# Patient Record
Sex: Male | Born: 1980 | Race: White | Hispanic: No | Marital: Single | Smoking: Never smoker
Health system: Southern US, Community
[De-identification: ages and names within clinical notes are randomized; demographics above are authoritative.]

## PROBLEM LIST (undated history)

## (undated) DIAGNOSIS — I10 Essential (primary) hypertension: Secondary | ICD-10-CM

## (undated) DIAGNOSIS — R569 Unspecified convulsions: Secondary | ICD-10-CM

## (undated) DIAGNOSIS — F32A Depression, unspecified: Secondary | ICD-10-CM

## (undated) DIAGNOSIS — F209 Schizophrenia, unspecified: Secondary | ICD-10-CM

## (undated) DIAGNOSIS — E119 Type 2 diabetes mellitus without complications: Secondary | ICD-10-CM

## (undated) DIAGNOSIS — E042 Nontoxic multinodular goiter: Secondary | ICD-10-CM

## (undated) DIAGNOSIS — K766 Portal hypertension: Secondary | ICD-10-CM

## (undated) DIAGNOSIS — E785 Hyperlipidemia, unspecified: Secondary | ICD-10-CM

## (undated) DIAGNOSIS — I4891 Unspecified atrial fibrillation: Secondary | ICD-10-CM

## (undated) DIAGNOSIS — I8500 Esophageal varices without bleeding: Secondary | ICD-10-CM

## (undated) DIAGNOSIS — D649 Anemia, unspecified: Secondary | ICD-10-CM

## (undated) DIAGNOSIS — F1020 Alcohol dependence, uncomplicated: Secondary | ICD-10-CM

## (undated) DIAGNOSIS — J449 Chronic obstructive pulmonary disease, unspecified: Secondary | ICD-10-CM

## (undated) DIAGNOSIS — K3189 Other diseases of stomach and duodenum: Secondary | ICD-10-CM

## (undated) DIAGNOSIS — K7031 Alcoholic cirrhosis of liver with ascites: Secondary | ICD-10-CM

## (undated) DIAGNOSIS — F329 Major depressive disorder, single episode, unspecified: Secondary | ICD-10-CM

## (undated) HISTORY — DX: Major depressive disorder, single episode, unspecified: F32.9

## (undated) HISTORY — PX: NO PAST SURGERIES: SHX2092

## (undated) HISTORY — DX: Hyperlipidemia, unspecified: E78.5

## (undated) HISTORY — DX: Depression, unspecified: F32.A

## (undated) HISTORY — DX: Essential (primary) hypertension: I10

## (undated) HISTORY — DX: Type 2 diabetes mellitus without complications: E11.9

## (undated) HISTORY — DX: Alcohol dependence, uncomplicated: F10.20

## (undated) HISTORY — DX: Schizophrenia, unspecified: F20.9

---

## 2004-07-30 ENCOUNTER — Emergency Department: Payer: Self-pay | Admitting: Emergency Medicine

## 2005-05-18 ENCOUNTER — Emergency Department: Payer: Self-pay | Admitting: Emergency Medicine

## 2005-09-25 ENCOUNTER — Ambulatory Visit: Payer: Self-pay | Admitting: Specialist

## 2009-03-17 ENCOUNTER — Emergency Department: Payer: Self-pay | Admitting: Emergency Medicine

## 2009-12-04 ENCOUNTER — Emergency Department: Payer: Self-pay | Admitting: Unknown Physician Specialty

## 2010-09-16 ENCOUNTER — Emergency Department: Payer: Self-pay | Admitting: Emergency Medicine

## 2011-06-18 ENCOUNTER — Emergency Department: Payer: Self-pay | Admitting: Emergency Medicine

## 2012-04-16 ENCOUNTER — Emergency Department: Payer: Self-pay | Admitting: Emergency Medicine

## 2012-04-16 LAB — CK TOTAL AND CKMB (NOT AT ARMC)
CK, Total: 142 U/L (ref 35–232)
CK-MB: <0.5 ng/mL — ABNORMAL LOW (ref 0.5–3.6)

## 2012-04-16 LAB — BASIC METABOLIC PANEL
Anion Gap: 7 (ref 7–16)
Calcium, Total: 8.6 mg/dL (ref 8.5–10.1)
Chloride: 106 mmol/L (ref 98–107)
Co2: 27 mmol/L (ref 21–32)
Creatinine: 1.21 mg/dL (ref 0.60–1.30)
Glucose: 110 mg/dL — ABNORMAL HIGH (ref 65–99)
Osmolality: 280 (ref 275–301)

## 2012-04-16 LAB — CBC
MCH: 30.6 pg (ref 26.0–34.0)
MCHC: 33.8 g/dL (ref 32.0–36.0)
MCV: 90 fL (ref 80–100)
Platelet: 173 10*3/uL (ref 150–440)
RDW: 14.2 % (ref 11.5–14.5)
WBC: 7.7 10*3/uL (ref 3.8–10.6)

## 2012-10-15 ENCOUNTER — Emergency Department: Payer: Self-pay | Admitting: Emergency Medicine

## 2013-11-28 ENCOUNTER — Emergency Department: Payer: Self-pay | Admitting: Emergency Medicine

## 2013-12-13 ENCOUNTER — Emergency Department: Payer: Self-pay | Admitting: Emergency Medicine

## 2014-05-11 ENCOUNTER — Emergency Department: Payer: Self-pay | Admitting: Emergency Medicine

## 2014-07-23 ENCOUNTER — Emergency Department: Payer: Self-pay | Admitting: Emergency Medicine

## 2014-07-23 LAB — COMPREHENSIVE METABOLIC PANEL
ALBUMIN: 3.4 g/dL (ref 3.4–5.0)
ALT: 76 U/L — AB
Alkaline Phosphatase: 144 U/L — ABNORMAL HIGH
Anion Gap: 9 (ref 7–16)
BILIRUBIN TOTAL: 0.9 mg/dL (ref 0.2–1.0)
BUN: 10 mg/dL (ref 7–18)
Calcium, Total: 8.6 mg/dL (ref 8.5–10.1)
Chloride: 104 mmol/L (ref 98–107)
Co2: 30 mmol/L (ref 21–32)
Creatinine: 0.68 mg/dL (ref 0.60–1.30)
Glucose: 79 mg/dL (ref 65–99)
OSMOLALITY: 283 (ref 275–301)
Potassium: 3.5 mmol/L (ref 3.5–5.1)
SGOT(AST): 94 U/L — ABNORMAL HIGH (ref 15–37)
SODIUM: 143 mmol/L (ref 136–145)
Total Protein: 8.1 g/dL (ref 6.4–8.2)

## 2014-07-23 LAB — CBC
HCT: 43.7 % (ref 40.0–52.0)
HGB: 14.5 g/dL (ref 13.0–18.0)
MCH: 31.7 pg (ref 26.0–34.0)
MCHC: 33.1 g/dL (ref 32.0–36.0)
MCV: 96 fL (ref 80–100)
PLATELETS: 138 10*3/uL — AB (ref 150–440)
RBC: 4.56 10*6/uL (ref 4.40–5.90)
RDW: 16.1 % — AB (ref 11.5–14.5)
WBC: 8.5 10*3/uL (ref 3.8–10.6)

## 2014-07-23 LAB — TROPONIN I

## 2014-07-23 LAB — LIPASE, BLOOD: Lipase: 119 U/L (ref 73–393)

## 2014-07-23 LAB — MAGNESIUM: Magnesium: 1.6 mg/dL — ABNORMAL LOW

## 2014-11-26 ENCOUNTER — Emergency Department: Payer: Self-pay | Admitting: Emergency Medicine

## 2014-12-25 ENCOUNTER — Inpatient Hospital Stay
Admit: 2014-12-25 | Disposition: A | Discharge status: Home / Self Care | Payer: Self-pay | Attending: Psychiatry | Admitting: Psychiatry

## 2014-12-25 LAB — DRUG SCREEN, URINE
Amphetamines, Ur Screen: NEGATIVE
Barbiturates, Ur Screen: NEGATIVE
Benzodiazepine, Ur Scrn: NEGATIVE
COCAINE METABOLITE, UR ~~LOC~~: NEGATIVE
Cannabinoid 50 Ng, Ur ~~LOC~~: NEGATIVE
MDMA (Ecstasy)Ur Screen: NEGATIVE
METHADONE, UR SCREEN: NEGATIVE
Opiate, Ur Screen: NEGATIVE
Phencyclidine (PCP) Ur S: NEGATIVE
TRICYCLIC, UR SCREEN: NEGATIVE

## 2014-12-25 LAB — CBC
HCT: 44.7 % (ref 40.0–52.0)
HGB: 14.4 g/dL (ref 13.0–18.0)
MCH: 30.5 pg (ref 26.0–34.0)
MCHC: 32.2 g/dL (ref 32.0–36.0)
MCV: 95 fL (ref 80–100)
Platelet: 155 10*3/uL (ref 150–440)
RBC: 4.72 10*6/uL (ref 4.40–5.90)
RDW: 15.3 % — AB (ref 11.5–14.5)
WBC: 10.1 10*3/uL (ref 3.8–10.6)

## 2014-12-25 LAB — URINALYSIS, COMPLETE
BACTERIA: NONE SEEN
BILIRUBIN, UR: NEGATIVE
GLUCOSE, UR: NEGATIVE mg/dL (ref 0–75)
Hyaline Cast: 5
Ketone: NEGATIVE
LEUKOCYTE ESTERASE: NEGATIVE
Nitrite: NEGATIVE
Ph: 5 (ref 4.5–8.0)
Protein: 30
RBC,UR: 9 /HPF (ref 0–5)
SPECIFIC GRAVITY: 1.016 (ref 1.003–1.030)
Squamous Epithelial: <1

## 2014-12-25 LAB — COMPREHENSIVE METABOLIC PANEL
ALT: 107 U/L — AB
Albumin: 4.1 g/dL
Alkaline Phosphatase: 148 U/L — ABNORMAL HIGH
Anion Gap: 11 (ref 7–16)
BUN: 15 mg/dL
Bilirubin,Total: 0.9 mg/dL
CREATININE: 1.23 mg/dL
Calcium, Total: 8.8 mg/dL — ABNORMAL LOW
Chloride: 100 mmol/L — ABNORMAL LOW
Co2: 30 mmol/L
EGFR (African American): >60
EGFR (Non-African Amer.): >60
GLUCOSE: 104 mg/dL — AB
POTASSIUM: 3.4 mmol/L — AB
SGOT(AST): 146 U/L — ABNORMAL HIGH
SODIUM: 141 mmol/L
TOTAL PROTEIN: 8.5 g/dL — AB

## 2014-12-25 LAB — ACETAMINOPHEN LEVEL: Acetaminophen: <10 ug/mL

## 2014-12-25 LAB — SALICYLATE LEVEL

## 2014-12-25 LAB — ETHANOL: Ethanol: 173 mg/dL

## 2014-12-28 LAB — LIPID PANEL
CHOLESTEROL: 234 mg/dL — AB
HDL Cholesterol: 31 mg/dL — ABNORMAL LOW
LDL CHOLESTEROL, CALC: 185 mg/dL — AB
Triglycerides: 88 mg/dL
VLDL Cholesterol, Calc: 18 mg/dL

## 2014-12-28 LAB — TSH: Thyroid Stimulating Horm: 1.585 u[IU]/mL

## 2014-12-29 LAB — HEMOGLOBIN A1C: HEMOGLOBIN A1C: 5.7 %

## 2015-01-21 NOTE — Consult Note (Signed)
PATIENT NAME:  Howard [name], Howard [name] MR#:  [id number] DATE OF BIRTH:  03/21/1981  DATE OF CONSULTATION:  12/25/2014  REFERRING PHYSICIAN:   CONSULTING PHYSICIAN:  Audery Amel, MD  IDENTIFYING INFORMATION AND REASON FOR CONSULTATION: A 34 year old man with a history of schizophrenia and alcohol abuse who came to my office today with a chief complaint "I can't stand it, the ghosts are going to get me."   HISTORY OF PRESENT ILLNESS: Information from the patient and the chart and my history of working with this patient for the last few years. Howard [name] came to my office for an unscheduled visit today in great distress. He tells me that for the past 2 weeks he has felt frightened and been unable to sleep. He has not gotten any sleep for the last several days. He believes that there is a ghost in his house that is trying to get him. He tells me that he is hearing doors banging and has seen things floating in the hallway. He feels like his mood is very frightened. He claims that he has still been taking all of his medicines regularly, but admits that he has gone back to drinking heavily. He says he has probably been drinking at least a fifth of vodka a day for the last week, maybe more. This was a relapse after having worked his way down to drinking only about 1 time a week. He is not abusing any other drugs. The patient has some chronic social stress, but nothing really acute. He is still upset about his father dying 2-1/2 years ago, but there have not been any more recent deaths or upheavals in his family.   PAST PSYCHIATRIC HISTORY: Diagnosed with schizophrenia at around the age of 81. Never had a hospitalization area. Has been on multiple antipsychotics including Geodon which caused some high blood pressure, Risperdal, Zyprexa which he was stable for several years, and more recently Jordan which we switched to late last year in an attempt to help him lose weight. Medications have typically kept his symptoms  under pretty good control, although he has had episodes of breakthrough hallucinations. Auditory hallucinations and paranoia have been his main problems. No history of violence. He says that he did have a suicide attempt prior to his first diagnosis when he was hearing voices very loudly, but his father prevented him from shooting himself and has never repeated that.   PAST MEDICAL HISTORY: Overweight gentleman, has high blood pressure and asthma. No other known medical diagnoses.   SOCIAL HISTORY:  Unmarried. Lives with his brother. His mother is also living and lives in a retirement home. He is pretty close with his mother. When he is well Howard [name] has some very healthy interests such as working on automobiles and rebuilding them and music.   SUBSTANCE ABUSE HISTORY: The patient only revealed to me a few months ago that he had developed a drinking problem. Evidently it had been going on for quite a while that he had been drinking vodka on a daily basis. He had kept it out of me out of embarrassment for a long time. After he told me about it we have been working on trying to get him to decrease his alcohol use. As of earlier in March he had gotten himself down to drinking only 1 day and night versus the 7 that he was doing before. These last couple of weeks he has had a relapse. No known history of DTs or seizures. No history of  other more formal substance abuse treatment.   FAMILY HISTORY: Both his father and grandfather had schizophrenia.   REVIEW OF SYSTEMS: He is feeling upset and tearful and frightened. Physically no pain. No GI upset. No pulmonary complaints. Really no physical complaints at all.   MENTAL STATUS EXAMINATION: Adequately groomed, looks older than his stated age. Cooperative with the interview. Good eye contact, normal psychomotor activity. Speech is quiet and decreased in total amount, which is pretty normal for him. Affect has been tearful and upset, although still reactive. Mood  is nervous. Thoughts are a little bit slow and concrete. He is paranoid and believes there is a ghost haunting his house. He is having auditory and possibly visual hallucinations. He is alert and oriented x 4. He can repeat 3 words immediately, remembers 2 out of 3 at 3 minutes. Judgment and insight fairly intact. Able to make reasonable decisions about his treatment.   LABORATORY RESULTS: Urine and drug screen not yet back, but the alcohol level was 173. Elevated total protein at 8.5, elevated AST at 146, elevated ALT at 107, elevated alkaline phosphatase at 148. Low calcium 8.3, low potassium 3.4, glucose 104. Everything else normal. CBC is all normal. Salicylates and acetaminophen negative.   VITAL SIGNS: Blood pressure 152/90, respirations 18, pulse 95, temperature 98.4.   ASSESSMENT: This is a 34 year old man with schizophrenia and alcohol abuse. He came to my office today very upset, paranoid, worsening hallucinations, tearful, feeling like he could not stand going on anymore. Also he has obviously relapsed into drinking. He has asked me for admission, and I think that is reasonable under the current circumstances, particularly given his lack of outpatient support.   TREATMENT PLAN: Admit to psychiatry. Continue his current medications, which are Latuda 80 mg a day, Celexa 20 mg a day, albuterol inhaler p.r.n., metoprolol daily, and an aspirin once a day. I am going to increase his Latuda dose to 120 mg. Close and suicide precautions in place.   DIAGNOSIS PRINCIPAL AND PRIMARY:   AXIS I: Schizophrenia.   SECONDARY DIAGNOSES:   AXIS I: Alcohol abuse, moderate.   AXIS II: Deferred.   AXIS III:  1.  High blood pressure.  2.  Obesity.  3.  Asthma.     ____________________________ Audery AmelJohn T. Aundreya Souffrant, MD jtc:bu D: 12/25/2014 15:37:45 ET T: 12/25/2014 15:56:55 ET JOB#: [id number]  cc: Audery AmelJohn T. Alanys Godino, MD, <Dictator> Audery AmelJOHN T Pau Banh MD ELECTRONICALLY SIGNED 12/26/2014 22:36

## 2015-01-21 NOTE — H&P (Signed)
PATIENT NAME:  Howard [name], [name] MR#:  [id number] DATE OF BIRTH:  1981-03-26  DATE OF ADMISSION:  12/25/2014  IDENTIFYING INFORMATION: The patient is 34 year old, single, Caucasian male [place], who lives with his brother and receives disability for a diagnosis of schizophrenia. This patient also has a history of alcohol dependence.   CHIEF COMPLAINT: "There is a ghost in my house."   HISTORY OF PRESENT ILLNESS:  This patient presented to our Emergency Department on April 4.  His alcohol level at arrival was 173.  The patient reported that he had been having auditory and visual hallucinations for the last 3 days and that he had had no asleep during the same period of time. He stated that he was afraid of going to sleep because there was a ghost in the house.  The patient also reported that he was drinking about a 5th of vodka on a daily basis for the last 2 weeks. The patient stated that he had not been taking his medications as prescribed.  The main reason for coming into the Emergency Room was because "the ghost was bothering a lot."  Today, he denies auditory or visual hallucinations. Denies suicidal or homicidal ideations. The patient was somnolent and was falling asleep during the assessment.  Other information was obtained from the patient's chart. Since his admission, CIWA has been checked and several of them have been about 10.  His vital signs also show evidence of alcohol withdrawal as he has been tachycardic and his systolic and diastolic blood pressure have been significantly elevated.   PAST PSYCHIATRIC HISTORY: The patient states that he follows with Dr. Toni Amendlapacs from [facility]. He has been diagnosed with schizophrenia and alcohol dependence. He has had multiple trials with antipsychotics in the past such as Geodon, risperidone, Zyprexa and most recently JordanLatuda. There is no history of suicidal attempts, but per the note from Dr. Toni Amendlapacs the patient's father  prevented him from shooting himself years ago.  MEDICATIONS PRESCRIBED PRIOR TO ADMISSION:  Latuda 120 mg at bedtime and citalopram 20 mg daily.   PAST MEDICAL HISTORY: Positive for hypertension for which he takes Toprol XL 50 mg p.o. daily.   FAMILY HISTORY: The patient reports that his dad, uncle, and great-grandfather also suffered from schizophrenia.  He denies any history of suicides in his family.   SOCIAL HISTORY: The patient receives disability for schizophrenia since 2003. He lives with [place] brother in [name], [name]. He is single, never married, does not have any children. No known legal history.   ALLERGIES: No known drug allergies.   REVIEW OF SYSTEMS: The patient complains of nausea and tremors.  Denies vomiting, diarrhea, or seizures. The rest of the 10 review is negative.   MENTAL STATUS EXAMINATION: The patient is a 34 year old Caucasian male, obese, who appears his stated age. He is lying in bed covered with a blanket and was falling asleep during the assessment. Psychomotor activity is decreased. He did appear to have some tremors. Eye contact was poor as he was falling asleep. Speech had decreased production and was not a spontaneous regular tone and rate. Thought content: Negative for suicidality, homicidality, and negative for auditory or visual hallucinations.  Positive for delusions about ghosts living in his house and bothering him. Mood dysphoric, affect restrictive, insight and judgment limited. Cognitive examination:  He is alert and oriented in person, place, time, and situation.   PHYSICAL EXAMINATION:  VITAL SIGNS: Blood pressure 186/118, pulse 83, temperature 98.2, respirations  20.  GENERAL: A 34 year old, obese, white male. His weight is 327 pounds. BMI is 40.9.  Height is 75 inches.  Physical examination was performed in our Emergency Department and was within the normal limits.  MUSCULOSKELETAL: He has normal gait, normal muscular tone. No evidence of  involuntary movements other than tremors.   LABORATORY RESULTS: BUN 15, creatinine 1.23, sodium 141, potassium 3.4, calcium 8.8. Alcohol 173. AST 146, ALT 107. The urine toxicology was negative. WBC is 10.1, hemoglobin is 14.4, hematocrit is 44.5, platelet count is 155,000.  UA was negative. Acetaminophen and salicylate level were below detection limits.   DIAGNOSES:  AXIS I: Schizophrenia, alcohol use disorder, severe. Alcohol withdrawal, hypertension, obesity.    PLAN:  For schizophrenia, the patient will be continued on Latuda 120 mg in the evening with supper. For depression, he will be continued on citalopram 20 mg daily. For hypertension, he will be continued on metoprolol Toprol XL 50 mg daily.  For tobacco use disorder, he will receive Nicotrol inhaler. For cardiovascular prevention, he will receive aspirin 81 mg daily. For alcohol withdrawal, he will receive Librium 50 mg every 8 hours, lorazepam 2 mg every 8 hours as needed for CIWA greater than 15.  For nausea, he will receive promethazine 25 mg every 8 hours.   PRECAUTIONS: He will be continued on fall precautions.  CIWA will be checked every 8 hours and vital signs will be checked every 8 hours.    ____________________________ Howard Footman, MD ahg:sp D: 12/26/2014 14:25:52 ET T: 12/26/2014 14:44:40 ET JOB#: [id number]  cc: Howard Footman, MD, <Dictator> Howard Chin MD ELECTRONICALLY SIGNED 01/01/2015 15:11

## 2015-03-20 ENCOUNTER — Ambulatory Visit (INDEPENDENT_AMBULATORY_CARE_PROVIDER_SITE_OTHER): Payer: Medicare Other | Admitting: Psychiatry

## 2015-03-20 ENCOUNTER — Encounter: Payer: Self-pay | Admitting: Psychiatry

## 2015-03-20 VITALS — BP 132/88 | Temp 98.5°F | Resp 74 | Ht 75.0 in | Wt 325.2 lb

## 2015-03-20 DIAGNOSIS — F1010 Alcohol abuse, uncomplicated: Secondary | ICD-10-CM | POA: Diagnosis not present

## 2015-03-20 DIAGNOSIS — F200 Paranoid schizophrenia: Secondary | ICD-10-CM

## 2015-03-20 MED ORDER — DIPHENHYDRAMINE HCL (SLEEP) 25 MG PO CAPS: 50.0000 mg | ORAL_CAPSULE | Freq: Every day | ORAL | Status: DC

## 2015-03-21 NOTE — Progress Notes (Signed)
BH MD/PA/NP OP Progress Note  03/21/2015 9:30 AM Howard [name]  MRN:  [id number]  Subjective:  Follow-up for this 34 year old gentleman with a history of paranoid schizophrenia and more recently alcohol abuse. Also significant social withdrawal area he tells me that recently he has had some stresses. A neighbor of his was pressuring him to go to church. At first he felt like this was a friendly and positive gesture but the neighbor became more pushy and demanded that Howard [name] go to Bible study as well which was too much for him. He had to eventually quit going. He feels sad and anxious about this. No suicidal or homicidal ideation. Basically still functioning though. ADLs are okay. He still has auditory hallucinations at times but has insight into their unreality and is able to ignore them more block them out. He has some trouble falling asleep at night and gets anxious in the evening still. He is still drinking on Saturday and Sunday but has decreased it to about a pint of liquor which is half of his previous daily consumption and he is no longer drinking during the week. Chief Complaint:  "a lot of stress" Visit Diagnosis:  No diagnosis found. paranoid schizophrenia call abuse  Past Medical History:  Past Medical History  Diagnosis Date  . Hypertension   . Schizophrenia   . Hyperlipemia    History reviewed. No pertinent past surgical history. Family History:  Family History  Problem Relation Age of Onset  . Heart disease Mother   . Hypertension Mother   . Hyperlipidemia Mother   . Stroke Father   . Heart attack Father   . Hypertension Father   . Heart disease Brother    Social History:  History   Social History  . Marital Status: Single    Spouse Name: N/A  . Number of Children: N/A  . Years of Education: N/A   Social History Main Topics  . Smoking status: Never Smoker   . Smokeless tobacco: Never Used  . Alcohol Use: 3.0 oz/week    0 Standard drinks or equivalent, 5 Shots of  liquor per week  . Drug Use: No  . Sexual Activity: No   Other Topics Concern  . None   Social History Narrative  . None   Additional History: Patient is going to the IOP program at [facility]. He finds it helpful. Continues to work on cutting down his drinking. Tolerating medicine without problem side effects. Weight is no worse than it was before. Patient is not showing active problematic delusions and he is open to advice and suggestions. No suicidal or homicidal ideation or signs of recent dangerousness.  Assessment: Patient is stable in terms of his schizophrenia. We could increase medicine but I'm not sure that the potential benefit is worth the risk of side effects. Lots of encouragement about his continued participation in substance abuse programming  Musculoskeletal: Strength & Muscle Tone: within normal limits Gait & Station: normal Patient leans: N/A  Psychiatric Specialty Exam: HPI  ROS  Blood pressure 132/88, temperature 98.5 F (36.9 C), temperature source Tympanic, resp. rate 74, height  (1.905 m), weight 325 lb 3.2 oz (147.51 kg), SpO2 92 %.Body mass index is 40.65 kg/(m^2).  General Appearance: Casual  Eye Contact:  Good  Speech:  Clear and Coherent  Volume:  Normal  Mood:  Anxious  Affect:  Congruent  Thought Process:  Linear  Orientation:  Full (Time, Place, and Person)  Thought Content:  Hallucinations: Auditory  Suicidal  Thoughts:  No  Homicidal Thoughts:  No  Memory:  Immediate;   Good Recent;   Good Remote;   Good  Judgement:  Intact  Insight:  Present  Psychomotor Activity:  Normal  Concentration:  Good  Recall:  Good  Fund of Knowledge: Fair  Language: Fair  Akathisia:  No  Handed:  Right  AIMS (if indicated):  0  Assets:  Communication Skills Desire for Improvement Financial Resources/Insurance Housing Resilience Social Support Talents/Skills  ADL's:  Intact  Cognition: WNL  Sleep:  Still has a little bit of trouble with sleep despite  using 25 mg of Benadryl. Has some trouble falling asleep at night because of worry.    Is the patient at risk to self?  No. Has the patient been a risk to self in the past 6 months?  Yes.   Has the patient been a risk to self within the distant past?  Yes.   Is the patient a risk to others?  No. Has the patient been a risk to others in the past 6 months?  No. Has the patient been a risk to others within the distant past?  No.  Current Medications: Current Outpatient Prescriptions  Medication Sig Dispense Refill  . citalopram (CELEXA) 20 MG tablet TAKE 1 TABLET BY MOUTH ONCE A DAY FOR DEPRESSION AND ANXIETY  0  . [facility] ALLERGY 25 MG tablet Take 50 mg by mouth at bedtime.  3  . DiphenhydrAMINE HCl, Sleep, 25 MG CAPS Take 50 mg by mouth at bedtime. 60 capsule 2  . losartan-hydrochlorothiazide (HYZAAR) 100-25 MG per tablet Take 1 tablet by mouth daily.  5  . metoprolol succinate (TOPROL-XL) 100 MG 24 hr tablet Take 100 mg by mouth daily.  5  . OLANZapine (ZYPREXA) 15 MG tablet      No current facility-administered medications for this visit.    Medical Decision Making:  Established Problem, Stable/Improving (1), Review of Psycho-Social Stressors (1), Review or order clinical lab tests (1), Review of Last Therapy Session (1), Review of Medication Regimen & Side Effects (2) and Review of New Medication or Change in Dosage (2)  Treatment Plan Summary:Medication management and Plan Thoughts of supportive therapy around his continued participation in substance abuse treatment. Encourage him also not to give up on going to church which is an activity he enjoys. Suggest that he can find alternative ways of doing this without feeling pressured. He expresses an understanding of this and seems to have motivation to change. No change to his anti-psychotic or citalopram. I would suggest that we try increasing the Benadryl to 50 mg at night as needed for sleep. He agrees to give it a try. I will see him back in  2-3 months. No other change plan right now.   Armida Vickroy 03/21/2015, 9:30 AM

## 2015-04-10 ENCOUNTER — Other Ambulatory Visit: Payer: Self-pay

## 2015-04-10 MED ORDER — CITALOPRAM HYDROBROMIDE 20 MG PO TABS
20.0000 mg | ORAL_TABLET | Freq: Every day | ORAL | Status: DC
Start: 2015-04-10 — End: 2015-05-17

## 2015-04-10 NOTE — Telephone Encounter (Signed)
received a fax requesting refill on citalopram hbr 20 mg take 1 tablet by mouth once a day for depression and anxiety. ( pt last seen on 03-20-15 next appt is for  05-17-15)

## 2015-05-08 ENCOUNTER — Other Ambulatory Visit: Payer: Self-pay | Admitting: Psychiatry

## 2015-05-11 ENCOUNTER — Other Ambulatory Visit: Payer: Self-pay

## 2015-05-11 NOTE — Telephone Encounter (Signed)
pt left message that he needs refill on zyprexa. pt has appt on 05-17-15  

## 2015-05-11 NOTE — Telephone Encounter (Signed)
pt left message that he needs refill on zyprexa. pt has appt on 05-17-15

## 2015-05-14 MED ORDER — OLANZAPINE 15 MG PO TABS: 15.0000 mg | ORAL_TABLET | Freq: Every day | ORAL | Status: DC

## 2015-05-17 ENCOUNTER — Ambulatory Visit (INDEPENDENT_AMBULATORY_CARE_PROVIDER_SITE_OTHER): Payer: Medicare Other | Admitting: Psychiatry

## 2015-05-17 DIAGNOSIS — F200 Paranoid schizophrenia: Secondary | ICD-10-CM | POA: Diagnosis not present

## 2015-05-17 MED ORDER — OLANZAPINE 15 MG PO TABS: 15.0000 mg | ORAL_TABLET | Freq: Every day | ORAL | Status: DC

## 2015-05-17 MED ORDER — CITALOPRAM HYDROBROMIDE 20 MG PO TABS
20.0000 mg | ORAL_TABLET | Freq: Every day | ORAL | Status: DC
Start: 2015-05-17 — End: 2015-07-12

## 2015-05-29 ENCOUNTER — Emergency Department
Admission: EM | Admit: 2015-05-29 | Discharge: 2015-05-30 | Disposition: A | Discharge status: Other Acute Inpatient Hospital | Payer: Medicare Other | Attending: Emergency Medicine | Admitting: Emergency Medicine

## 2015-05-29 ENCOUNTER — Encounter: Payer: Self-pay | Admitting: Emergency Medicine

## 2015-05-29 DIAGNOSIS — F200 Paranoid schizophrenia: Secondary | ICD-10-CM | POA: Insufficient documentation

## 2015-05-29 DIAGNOSIS — F1310 Sedative, hypnotic or anxiolytic abuse, uncomplicated: Secondary | ICD-10-CM | POA: Diagnosis not present

## 2015-05-29 DIAGNOSIS — F1010 Alcohol abuse, uncomplicated: Secondary | ICD-10-CM | POA: Diagnosis present

## 2015-05-29 DIAGNOSIS — Z79899 Other long term (current) drug therapy: Secondary | ICD-10-CM | POA: Diagnosis not present

## 2015-05-29 DIAGNOSIS — F10239 Alcohol dependence with withdrawal, unspecified: Secondary | ICD-10-CM | POA: Insufficient documentation

## 2015-05-29 DIAGNOSIS — F1020 Alcohol dependence, uncomplicated: Secondary | ICD-10-CM

## 2015-05-29 DIAGNOSIS — F10939 Alcohol use, unspecified with withdrawal, unspecified: Secondary | ICD-10-CM

## 2015-05-29 DIAGNOSIS — I10 Essential (primary) hypertension: Secondary | ICD-10-CM | POA: Insufficient documentation

## 2015-05-29 LAB — URINALYSIS COMPLETE WITH MICROSCOPIC (ARMC ONLY)
BILIRUBIN URINE: NEGATIVE
Glucose, UA: NEGATIVE mg/dL
KETONES UR: NEGATIVE mg/dL
Leukocytes, UA: NEGATIVE
Nitrite: NEGATIVE
PH: 9 — AB (ref 5.0–8.0)
PROTEIN: 30 mg/dL — AB
Specific Gravity, Urine: 1.014 (ref 1.005–1.030)

## 2015-05-29 LAB — COMPREHENSIVE METABOLIC PANEL
ALK PHOS: 114 U/L (ref 38–126)
ALT: 54 U/L (ref 17–63)
AST: 153 U/L — AB (ref 15–41)
Albumin: 3.3 g/dL — ABNORMAL LOW (ref 3.5–5.0)
Anion gap: 8 (ref 5–15)
BUN: 6 mg/dL (ref 6–20)
CALCIUM: 8 mg/dL — AB (ref 8.9–10.3)
CO2: 31 mmol/L (ref 22–32)
Chloride: 102 mmol/L (ref 101–111)
Creatinine, Ser: 0.71 mg/dL (ref 0.61–1.24)
GFR calc non Af Amer: >60 mL/min (ref >60)
Glucose, Bld: 161 mg/dL — ABNORMAL HIGH (ref 65–99)
Potassium: 3.3 mmol/L — ABNORMAL LOW (ref 3.5–5.1)
Sodium: 141 mmol/L (ref 135–145)
Total Bilirubin: 1.5 mg/dL — ABNORMAL HIGH (ref 0.3–1.2)
Total Protein: 7.8 g/dL (ref 6.5–8.1)

## 2015-05-29 LAB — URINE DRUG SCREEN, QUALITATIVE (ARMC ONLY)
AMPHETAMINES, UR SCREEN: NOT DETECTED
BENZODIAZEPINE, UR SCRN: POSITIVE — AB
Barbiturates, Ur Screen: NOT DETECTED
Cannabinoid 50 Ng, Ur ~~LOC~~: NOT DETECTED
Cocaine Metabolite,Ur ~~LOC~~: NOT DETECTED
MDMA (Ecstasy)Ur Screen: NOT DETECTED
METHADONE SCREEN, URINE: NOT DETECTED
Opiate, Ur Screen: NOT DETECTED
Phencyclidine (PCP) Ur S: NOT DETECTED
Tricyclic, Ur Screen: NOT DETECTED

## 2015-05-29 LAB — CBC WITH DIFFERENTIAL/PLATELET
Basophils Absolute: 0.1 10*3/uL (ref 0–0.1)
Basophils Relative: 1 %
Eosinophils Absolute: 0.2 10*3/uL (ref 0–0.7)
Eosinophils Relative: 2 %
HEMATOCRIT: 41.6 % (ref 40.0–52.0)
HEMOGLOBIN: 13.4 g/dL (ref 13.0–18.0)
LYMPHS ABS: 1.6 10*3/uL (ref 1.0–3.6)
LYMPHS PCT: 17 %
MCH: 30.9 pg (ref 26.0–34.0)
MCHC: 32.2 g/dL (ref 32.0–36.0)
MCV: 96 fL (ref 80.0–100.0)
Monocytes Absolute: 0.9 10*3/uL (ref 0.2–1.0)
Monocytes Relative: 9 %
NEUTROS ABS: 6.5 10*3/uL (ref 1.4–6.5)
Neutrophils Relative %: 71 %
Platelets: 102 10*3/uL — ABNORMAL LOW (ref 150–440)
RBC: 4.34 MIL/uL — ABNORMAL LOW (ref 4.40–5.90)
RDW: 16 % — ABNORMAL HIGH (ref 11.5–14.5)
WBC: 9.2 10*3/uL (ref 3.8–10.6)

## 2015-05-29 LAB — ETHANOL: ALCOHOL ETHYL (B): 79 mg/dL — AB (ref <5)

## 2015-05-29 MED ORDER — METOPROLOL SUCCINATE ER 50 MG PO TB24
ORAL_TABLET | ORAL | Status: AC
  Filled 2015-05-29: qty 2

## 2015-05-29 MED ORDER — OLANZAPINE 5 MG PO TABS
ORAL_TABLET | ORAL | Status: AC
  Filled 2015-05-29: qty 1

## 2015-05-29 MED ORDER — OLANZAPINE 10 MG PO TABS
ORAL_TABLET | ORAL | Status: AC
  Filled 2015-05-29: qty 1

## 2015-05-29 MED ORDER — PANTOPRAZOLE SODIUM 40 MG PO TBEC
DELAYED_RELEASE_TABLET | ORAL | Status: AC
  Filled 2015-05-29: qty 1

## 2015-05-29 MED ORDER — CITALOPRAM HYDROBROMIDE 20 MG PO TABS
20.0000 mg | ORAL_TABLET | Freq: Every day | ORAL | Status: DC
  Administered 2015-05-29 – 2015-05-30 (×2): 20 mg via ORAL
  Filled 2015-05-29: qty 1

## 2015-05-29 MED ORDER — LORAZEPAM 2 MG PO TABS
0.0000 mg | ORAL_TABLET | Freq: Two times a day (BID) | ORAL | Status: DC
  Administered 2015-05-29 – 2015-05-30 (×2): 2 mg via ORAL
  Filled 2015-05-29 (×2): qty 1

## 2015-05-29 MED ORDER — LORAZEPAM 2 MG/ML IJ SOLN: 0.0000 mg | Freq: Two times a day (BID) | INTRAMUSCULAR | Status: DC

## 2015-05-29 MED ORDER — METOPROLOL SUCCINATE ER 100 MG PO TB24
100.0000 mg | ORAL_TABLET | Freq: Every day | ORAL | Status: DC
  Administered 2015-05-29 – 2015-05-30 (×2): 100 mg via ORAL
  Filled 2015-05-29: qty 1

## 2015-05-29 MED ORDER — PANTOPRAZOLE SODIUM 40 MG PO TBEC
40.0000 mg | DELAYED_RELEASE_TABLET | Freq: Every day | ORAL | Status: DC
  Administered 2015-05-29 – 2015-05-30 (×2): 40 mg via ORAL
  Filled 2015-05-29: qty 1

## 2015-05-29 MED ORDER — OLANZAPINE 5 MG PO TABS
15.0000 mg | ORAL_TABLET | Freq: Every day | ORAL | Status: DC
  Administered 2015-05-29: 15 mg via ORAL

## 2015-05-29 MED ORDER — LORAZEPAM 2 MG/ML IJ SOLN: 0.0000 mg | Freq: Four times a day (QID) | INTRAMUSCULAR | Status: DC

## 2015-05-29 MED ORDER — CITALOPRAM HYDROBROMIDE 20 MG PO TABS
ORAL_TABLET | ORAL | Status: AC
  Filled 2015-05-29: qty 1

## 2015-05-29 MED ORDER — THIAMINE HCL 100 MG/ML IJ SOLN: 100.0000 mg | Freq: Every day | INTRAMUSCULAR | Status: DC

## 2015-05-29 MED ORDER — VITAMIN B-1 100 MG PO TABS
100.0000 mg | ORAL_TABLET | Freq: Every day | ORAL | Status: DC
  Administered 2015-05-29 – 2015-05-30 (×2): 100 mg via ORAL
  Filled 2015-05-29 (×2): qty 1

## 2015-05-29 MED ORDER — SIMVASTATIN 40 MG PO TABS: 40.0000 mg | ORAL_TABLET | Freq: Every day | ORAL | Status: DC

## 2015-05-29 MED ORDER — LORAZEPAM 2 MG PO TABS
0.0000 mg | ORAL_TABLET | Freq: Four times a day (QID) | ORAL | Status: DC
  Administered 2015-05-29: 2 mg via ORAL
  Administered 2015-05-29: 1 mg via ORAL
  Filled 2015-05-29 (×2): qty 1

## 2015-05-29 NOTE — ED Notes (Signed)

## 2015-05-29 NOTE — ED Notes (Signed)

## 2015-05-29 NOTE — ED Notes (Signed)
BEHAVIORAL HEALTH ROUNDING Patient sleeping: No. Patient alert and oriented: yes Behavior appropriate: Yes.  ;  Nutrition and fluids offered: Yes  Toileting and hygiene offered: Yes  Sitter present: yes Law enforcement present: Yes  

## 2015-05-29 NOTE — ED Notes (Signed)
Report received from [facility] RN. Patient care assumed. Patient/RN introduction complete. Pt laying in bed at this time without complaints or distress.  Pt sleeping but easily aroused.  Will continue to monitor.

## 2015-05-29 NOTE — ED Notes (Signed)
Patient resting in bed. He is still exhibiting s/s ETOH withdrawal and was medicated per protocol. His mood is mildly anxious with congruent affect. Patient is currently not experiencing any hallucinations. Speech is coherent, thoughts are organized.  Will continue to monitor detox and maintain on 15 minute checks for safety.

## 2015-05-29 NOTE — ED Notes (Signed)

## 2015-05-29 NOTE — ED Notes (Signed)
Pt refused lunch tray, pt given water

## 2015-05-29 NOTE — ED Notes (Signed)
ED BHU PLACEMENT JUSTIFICATION Is the patient under IVC or is there intent for IVC:No  Is the patient medically cleared: Yes.   Is there vacancy in the ED BHU: Yes.   Is the population mix appropriate for patient: Yes.   Is the patient awaiting placement in inpatient or outpatient setting: Yes.   Has the patient had a psychiatric consult: Yes.   Survey of unit performed for contraband, proper placement and condition of furniture, tampering with fixtures in bathroom, shower, and each patient room: Yes.  ; Findings:  APPEARANCE/BEHAVIOR calm, cooperative and adequate rapport can be established NEURO ASSESSMENT Orientation: time, place and person Hallucinations: Yes.  Auditory Hallucinations Speech: Normal Gait: normal RESPIRATORY ASSESSMENT Normal expansion.  Clear to auscultation.  No rales, rhonchi, or wheezing. CARDIOVASCULAR ASSESSMENT regular rate and rhythm, S1, S2 normal, no murmur, click, rub or gallop GASTROINTESTINAL ASSESSMENT soft, nontender, BS WNL, no r/g EXTREMITIES normal strength, tone, and muscle mass PLAN OF CARE Provide calm/safe environment. Vital signs assessed twice daily. ED BHU Assessment once each 12-hour shift. Collaborate with intake RN daily or as condition indicates. Assure the ED provider has rounded once each shift. Provide and encourage hygiene. Provide redirection as needed. Assess for escalating behavior; address immediately and inform ED provider.  Assess family dynamic and appropriateness for visitation as needed: Yes.  ; If necessary, describe findings:  Educate the patient/family about BHU procedures/visitation: Yes.  ; If necessary, describe findings:  

## 2015-05-29 NOTE — ED Provider Notes (Signed)
[facility] Emergency Department Provider Note  ____________________________________________  Time seen: Approximately 915 AM  I have reviewed the triage vital signs and the nursing notes.   HISTORY  Chief Complaint Alcohol Problem    HPI Howard [name] is a 34 y.o. male with a history of schizophrenia and alcoholism was presenting today for alcohol detox. His that he is drinking heavily lately because there is a ghost tormenting him in his house. He says that his last drink was 10 PM last night. He says that he drinks about a fifth of vodka a day. No other substances that he says that he uses. He says that he just says when he is calling more and has tried to quit on his own but is failed. He says that he usually shaky until he drinks but is never seized or had delirium tremens in the past. He denies any suicidal or homicidal ideation at this time.He also denies taking any of his antihypertensive medicines this morning.   Past Medical History  Diagnosis Date  . Hypertension   . Schizophrenia   . Hyperlipemia     Patient Active Problem List   Diagnosis Date Noted  . Paranoid schizophrenia 03/20/2015  . Alcohol addiction 03/20/2015    History reviewed. No pertinent past surgical history.  Current Outpatient Rx  Name  Route  Sig  Dispense  Refill  . albuterol (PROVENTIL HFA;VENTOLIN HFA) 108 (90 BASE) MCG/ACT inhaler   Inhalation   Inhale into the lungs every 6 (six) hours as needed for wheezing or shortness of breath.         . citalopram (CELEXA) 20 MG tablet   Oral   Take 1 tablet (20 mg total) by mouth daily.   30 tablet   5   . clobetasol cream (TEMOVATE) 0.05 %   Topical   Apply 1 application topically daily. Apply thinly to affected psoriatic regions daily.         . DiphenhydrAMINE HCl, Sleep, 25 MG CAPS   Oral   Take 50 mg by mouth at bedtime.   60 capsule   2   . losartan-hydrochlorothiazide (HYZAAR) 100-25 MG per tablet    Oral   Take 1 tablet by mouth daily.         . metoprolol succinate (TOPROL-XL) 100 MG 24 hr tablet   Oral   Take 100 mg by mouth daily. Take with or immediately following a meal.         . OLANZapine (ZYPREXA) 15 MG tablet      TAKE 1 TABLET BY MOUTH AT BEDTIME   30 tablet   3   . omeprazole (PRILOSEC) 40 MG capsule   Oral   Take 40 mg by mouth every morning.         . simvastatin (ZOCOR) 40 MG tablet   Oral   Take 40 mg by mouth daily.           Allergies Review of patient's allergies indicates no known allergies.  Family History  Problem Relation Age of Onset  . Heart disease Mother   . Hypertension Mother   . Hyperlipidemia Mother   . Stroke Father   . Heart attack Father   . Hypertension Father   . Heart disease Brother     Social History Social History  Substance Use Topics  . Smoking status: Never Smoker   . Smokeless tobacco: Never Used  . Alcohol Use: 3.0 oz/week    0 Standard drinks  or equivalent, 5 Shots of liquor per week    Review of Systems Constitutional: No fever/chills Eyes: No visual changes. ENT: No sore throat. Cardiovascular: Denies chest pain. Respiratory: Denies shortness of breath. Gastrointestinal: No abdominal pain.  No nausea, no vomiting.  No diarrhea.  No constipation. Genitourinary: Negative for dysuria. Musculoskeletal: Negative for back pain. Skin: Negative for rash. Neurological: Negative for headaches, focal weakness or numbness.  10-point ROS otherwise negative.  ____________________________________________   PHYSICAL EXAM:  VITAL SIGNS: ED Triage Vitals  Enc Vitals Group     BP 05/29/15 0806 170/103 mmHg     Pulse Rate 05/29/15 0806 88     Resp 05/29/15 0806 20     Temp 05/29/15 0806 98.3 F (36.8 C)     Temp Source 05/29/15 0806 Oral     SpO2 05/29/15 0806 91 %     Weight 05/29/15 0806 330 lb (149.687 kg)     Height 05/29/15 0806  (1.905 m)     Head Cir --      Peak Flow --      Pain Score  --      Pain Loc --      Pain Edu? --      Excl. in GC? --     Constitutional: Alert and oriented. Well appearing and in no acute distress. Eyes: Conjunctivae are normal. PERRL. EOMI. Head: Atraumatic. Nose: No congestion/rhinnorhea. Mouth/Throat: Mucous membranes are moist.  Oropharynx non-erythematous. Neck: No stridor.   Cardiovascular: Normal rate, regular rhythm. Grossly normal heart sounds.  Good peripheral circulation. Respiratory: Normal respiratory effort.  No retractions. Lungs CTAB. Gastrointestinal: Soft and nontender. No distention. No abdominal bruits. No CVA tenderness. Musculoskeletal: No lower extremity tenderness nor edema.  No joint effusions. Neurologic:  Normal speech and language. No gross focal neurologic deficits are appreciated. No gait instability. Mild tremulousness to the bilateral hands. Skin:  Skin is warm, dry and intact. No rash noted. Psychiatric: Mood and affect are normal. Speech and behavior are normal.  ____________________________________________   LABS (all labs ordered are listed, but only abnormal results are displayed)  Labs Reviewed  CBC WITH DIFFERENTIAL/PLATELET - Abnormal; Notable for the following:    RBC 4.34 (*)    RDW 16.0 (*)    Platelets 102 (*)    All other components within normal limits  COMPREHENSIVE METABOLIC PANEL - Abnormal; Notable for the following:    Potassium 3.3 (*)    Glucose, Bld 161 (*)    Calcium 8.0 (*)    Albumin 3.3 (*)    AST 153 (*)    Total Bilirubin 1.5 (*)    All other components within normal limits  ETHANOL - Abnormal; Notable for the following:    Alcohol, Ethyl (B) 79 (*)    All other components within normal limits  URINALYSIS COMPLETEWITH MICROSCOPIC ([facility] ONLY)  URINE DRUG SCREEN, QUALITATIVE ([facility] ONLY)    ____________________________________________  EKG   ____________________________________________  RADIOLOGY   ____________________________________________   PROCEDURES    ____________________________________________   INITIAL IMPRESSION / ASSESSMENT AND PLAN / ED COURSE  Pertinent labs & imaging results that were available during my care of the patient were reviewed by me and considered in my medical decision making (see chart for details).  CIWA protocol ordered. We'll give patient 2 mg of Ativan by mouth. To be evaluated by behavioral health intake. ____________________________________________   FINAL CLINICAL IMPRESSION(S) / ED DIAGNOSES  Alcoholism with signs of withdrawal. Initial visit.    Myra Rude  Schaevitz, MD 05/29/15 [phone]

## 2015-05-29 NOTE — ED Notes (Signed)
Pt states he needs help to stop drinking.

## 2015-05-29 NOTE — ED Notes (Addendum)
Pt presents to ed with reports of wanting to stop drinking alcohol, states has been drinking for a while and last drink was last night. Pt noted to have moderate tremors and mildly diaphoretic at time of assessment.  Pt denies any thoughts of SI or HI at this time. Pt with hx of inpt tx at [facility].

## 2015-05-29 NOTE — ED Notes (Signed)
Patient experiencing s/s ETOH withdrawal. Medicated with Ativan 2 mg per CIWA score. Will continue to closely monitor.

## 2015-05-29 NOTE — ED Notes (Signed)
Pt resting in bed with eyes closed in no distress, respirations even and unlabored 
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
## 2015-05-29 NOTE — Consult Note (Signed)
[facility] Consult   Reason for Consult:  Consult for this 34 year old man with a history of schizophrenia and alcohol abuse Referring Physician:  Archie Balboa Patient Identification: Howard [name] MRN:  [id number] Principal Diagnosis: Paranoid schizophrenia Diagnosis:   Patient Active Problem List   Diagnosis Date Noted  . Hypertension [I10] 05/29/2015  . Paranoid schizophrenia [F20.0] 03/20/2015  . Alcohol addiction [F10.20] 03/20/2015    Total Time spent with patient: 1 hour  Subjective:   Howard [name] is a 34 y.o. male patient admitted with "it's been bad and I just thought I needed to get away and get some help".  HPI:  Patient is a 34 year old man known to me who has schizophrenia and also alcohol abuse. Recently his mood has been more depressed and anxious. He also has been having more auditory hallucinations and visual hallucinations. Feeling very frightened at home. Feels like there are ghosts at home that are tormenting him. He has not been able to sleep well. He has been back to drinking again up to a fifth of vodka per day for the last couple weeks. Denies abusing any other drugs. Says he has been compliant with his current medicine. Other than a relapse into drinking no new particular stressor. Patient has had vague hopeless thoughts but no suicidal intent.  Past psychiatric history: Long history of schizophrenia. Positive prior admissions to the hospital. Has responded well to medication management. Developed a drinking problem a couple years ago. Managed to get it under some degree of control recently going to outpatient programs. Has had a history of serious suicidal thoughts no history of violent behavior.  Medical history: Patient is overweight and has high blood pressure as well as gastric reflux and elevated cholesterol  Social history: Patient lives with his brother. If I understand correctly believe the brother also has some degree of chronic mental  impairment. They have other family members who live nearby. He gets along well with his brother seems to have a pretty good support system.  Substance abuse history: Just started drinking heavily a couple years ago but quickly worked his way up to a fifth of vodka a day. When he drinks he gets depressed and his psychotic symptoms get worse. No history of DTs. Gets very shaky but no clear history of seizures on substance abuse withdrawal.  Medication: Celexa, Zyprexa, metoprolol, simvastatin, Protonix and Hyzaar HPI Elements:   Quality:  Depressed mood as well as psychosis with worsening hallucinations. Severity:  Severe potentially life threatening very disturbing. Timing:  Worse over the past several days to week. Duration:  Ongoing. Context:  Heavy drinking.  Past Medical History:  Past Medical History  Diagnosis Date  . Hypertension   . Schizophrenia   . Hyperlipemia    History reviewed. No pertinent past surgical history. Family History:  Family History  Problem Relation Age of Onset  . Heart disease Mother   . Hypertension Mother   . Hyperlipidemia Mother   . Stroke Father   . Heart attack Father   . Hypertension Father   . Heart disease Brother    Social History:  History  Alcohol Use  . 3.0 oz/week  . 0 Standard drinks or equivalent, 5 Shots of liquor per week     History  Drug Use No    Social History   Social History  . Marital Status: Single    Spouse Name: N/A  . Number of Children: N/A  . Years of Education: N/A   Social  History Main Topics  . Smoking status: Never Smoker   . Smokeless tobacco: Never Used  . Alcohol Use: 3.0 oz/week    0 Standard drinks or equivalent, 5 Shots of liquor per week  . Drug Use: No  . Sexual Activity: No   Other Topics Concern  . None   Social History Narrative   Additional Social History:    Pain Medications: None Noted Prescriptions: None Noted Over the Counter: None Noted History of alcohol / drug use?:  Yes Longest period of sobriety (when/how long): "couple of months" Negative Consequences of Use: Personal relationships, Financial Withdrawal Symptoms: Tremors, Weakness Name of Substance 1: Alcohol 1 - Age of First Use: 22 1 - Amount (size/oz): 5th  of liquor 1 - Frequency: daily 1 - Duration: 12 years (on and off) 1 - Last Use / Amount: 05/29/15                   Allergies:  No Known Allergies  Labs:  Results for orders placed or performed during the hospital encounter of 05/29/15 (from the past 48 hour(s))  CBC with Differential     Status: Abnormal   Collection Time: 05/29/15  8:08 AM  Result Value Ref Range   WBC 9.2 3.8 - 10.6 K/uL   RBC 4.34 (L) 4.40 - 5.90 MIL/uL   Hemoglobin 13.4 13.0 - 18.0 g/dL   HCT 41.6 40.0 - 52.0 %   MCV 96.0 80.0 - 100.0 fL   MCH 30.9 26.0 - 34.0 pg   MCHC 32.2 32.0 - 36.0 g/dL   RDW 16.0 (H) 11.5 - 14.5 %   Platelets 102 (L) 150 - 440 K/uL   Neutrophils Relative % 71 %   Neutro Abs 6.5 1.4 - 6.5 K/uL   Lymphocytes Relative 17 %   Lymphs Abs 1.6 1.0 - 3.6 K/uL   Monocytes Relative 9 %   Monocytes Absolute 0.9 0.2 - 1.0 K/uL   Eosinophils Relative 2 %   Eosinophils Absolute 0.2 0 - 0.7 K/uL   Basophils Relative 1 %   Basophils Absolute 0.1 0 - 0.1 K/uL  Comprehensive metabolic panel     Status: Abnormal   Collection Time: 05/29/15  8:08 AM  Result Value Ref Range   Sodium 141 135 - 145 mmol/L   Potassium 3.3 (L) 3.5 - 5.1 mmol/L   Chloride 102 101 - 111 mmol/L   CO2 31 22 - 32 mmol/L   Glucose, Bld 161 (H) 65 - 99 mg/dL   BUN 6 6 - 20 mg/dL   Creatinine, Ser 0.71 0.61 - 1.24 mg/dL   Calcium 8.0 (L) 8.9 - 10.3 mg/dL   Total Protein 7.8 6.5 - 8.1 g/dL   Albumin 3.3 (L) 3.5 - 5.0 g/dL   AST 153 (H) 15 - 41 U/L   ALT 54 17 - 63 U/L   Alkaline Phosphatase 114 38 - 126 U/L   Total Bilirubin 1.5 (H) 0.3 - 1.2 mg/dL   GFR calc non Af Amer >60 >60 mL/min   GFR calc Af Amer >60 >60 mL/min    Comment: (NOTE) The eGFR has been  calculated using the CKD EPI equation. This calculation has not been validated in all clinical situations. eGFR's persistently <60 mL/min signify possible Chronic Kidney Disease.    Anion gap 8 5 - 15  Ethanol     Status: Abnormal   Collection Time: 05/29/15  8:08 AM  Result Value Ref Range   Alcohol, Ethyl (B) 79 (H) <  5 mg/dL    Comment:        LOWEST DETECTABLE LIMIT FOR SERUM ALCOHOL IS 5 mg/dL FOR MEDICAL PURPOSES ONLY   Urine Drug Screen, Qualitative ([facility] only)     Status: Abnormal   Collection Time: 05/29/15  8:30 PM  Result Value Ref Range   Tricyclic, Ur Screen NONE DETECTED NONE DETECTED   Amphetamines, Ur Screen NONE DETECTED NONE DETECTED   MDMA (Ecstasy)Ur Screen NONE DETECTED NONE DETECTED   Cocaine Metabolite,Ur [place] NONE DETECTED NONE DETECTED   Opiate, Ur Screen NONE DETECTED NONE DETECTED   Phencyclidine (PCP) Ur S NONE DETECTED NONE DETECTED   Cannabinoid 50 Ng, Ur [place] NONE DETECTED NONE DETECTED   Barbiturates, Ur Screen NONE DETECTED NONE DETECTED   Benzodiazepine, Ur Scrn POSITIVE (A) NONE DETECTED   Methadone Scn, Ur NONE DETECTED NONE DETECTED    Comment: (NOTE) 329  Tricyclics, urine               Cutoff 1000 ng/mL 200  Amphetamines, urine             Cutoff 1000 ng/mL 300  MDMA (Ecstasy), urine           Cutoff 500 ng/mL 400  Cocaine Metabolite, urine       Cutoff 300 ng/mL 500  Opiate, urine                   Cutoff 300 ng/mL 600  Phencyclidine (PCP), urine      Cutoff 25 ng/mL 700  Cannabinoid, urine              Cutoff 50 ng/mL 800  Barbiturates, urine             Cutoff 200 ng/mL 900  Benzodiazepine, urine           Cutoff 200 ng/mL 1000 Methadone, urine                Cutoff 300 ng/mL 1100 1200 The urine drug screen provides only a preliminary, unconfirmed 1300 analytical test result and should not be used for non-medical 1400 purposes. Clinical consideration and professional judgment should 1500 be applied to any positive drug screen result due  to possible 1600 interfering substances. A more specific alternate chemical method 1700 must be used in order to obtain a confirmed analytical result.  1800 Gas chromato graphy / mass spectrometry (GC/MS) is the preferred 1900 confirmatory method.   Urinalysis complete, with microscopic ([facility] only)     Status: Abnormal   Collection Time: 05/29/15  8:38 PM  Result Value Ref Range   Color, Urine AMBER (A) YELLOW   APPearance CLEAR (A) CLEAR   Glucose, UA NEGATIVE NEGATIVE mg/dL   Bilirubin Urine NEGATIVE NEGATIVE   Ketones, ur NEGATIVE NEGATIVE mg/dL   Specific Gravity, Urine 1.014 1.005 - 1.030   Hgb urine dipstick 2+ (A) NEGATIVE   pH 9.0 (H) 5.0 - 8.0   Protein, ur 30 (A) NEGATIVE mg/dL   Nitrite NEGATIVE NEGATIVE   Leukocytes, UA NEGATIVE NEGATIVE   RBC / HPF TOO NUMEROUS TO COUNT 0 - 5 RBC/hpf   WBC, UA 0-5 0 - 5 WBC/hpf   Bacteria, UA RARE (A) NONE SEEN   Squamous Epithelial / LPF 0-5 (A) NONE SEEN   Mucous PRESENT     Vitals: Blood pressure 193/88, pulse 107, temperature 98.6 F (37 C), temperature source Oral, resp. rate 20, height _0  (1.905 m), weight 149.687 kg (330 lb), SpO2 88 %.  Risk to Self: Suicidal Ideation: No Suicidal Intent: No Is patient at risk for suicide?: No Suicidal Plan?: No Access to Means: No What has been your use of drugs/alcohol within the last 12 months?: Alcohol, "5th a liquor" daily How many times?: 0 Other Self Harm Risks: 0 Triggers for Past Attempts: None known Intentional Self Injurious Behavior: None Risk to Others: Homicidal Ideation: No Thoughts of Harm to Others: No Current Homicidal Intent: No Current Homicidal Plan: No Access to Homicidal Means: No Identified Victim: None reported History of harm to others?: No Assessment of Violence: None Noted Violent Behavior Description: None Does patient have access to weapons?: No Criminal Charges Pending?: No Does patient have a court date: No Prior Inpatient Therapy: Prior  Inpatient Therapy: Yes Prior Therapy Dates: April 2016 Prior Therapy Facilty/Provider(s): [facility] Reason for Treatment: Schizophrenia and Alcohol Prior Outpatient Therapy: Prior Outpatient Therapy: Yes Prior Therapy Dates: twice a month Prior Therapy Facilty/Provider(s): Dr. Weber Cooks Reason for Treatment: Schizophrenia and Alcohol Does patient have an ACCT team?: No Does patient have Intensive In-House Services?  : No Does patient have [facility] services? : No Does patient have P4CC services?: No  Current Facility-Administered Medications  Medication Dose Route Frequency Provider Last Rate Last Dose  . citalopram (CELEXA) tablet 20 mg  20 mg Oral Daily Gonzella Lex, MD      . LORazepam (ATIVAN) injection 0-4 mg  0-4 mg Intravenous 4 times per day Orbie Pyo, MD      . LORazepam (ATIVAN) injection 0-4 mg  0-4 mg Intravenous Q12H Orbie Pyo, MD   0 mg at 05/29/15 0933  . LORazepam (ATIVAN) tablet 0-4 mg  0-4 mg Oral 4 times per day Orbie Pyo, MD   1 mg at 05/29/15 1821  . LORazepam (ATIVAN) tablet 0-4 mg  0-4 mg Oral Q12H Orbie Pyo, MD   2 mg at 05/29/15 0933  . metoprolol succinate (TOPROL-XL) 24 hr tablet 100 mg  100 mg Oral Daily Gonzella Lex, MD      . OLANZapine (ZYPREXA) tablet 15 mg  15 mg Oral QHS Gonzella Lex, MD      . pantoprazole (PROTONIX) EC tablet 40 mg  40 mg Oral Daily Gonzella Lex, MD      . Derrill Memo ON 05/30/2015] simvastatin (ZOCOR) tablet 40 mg  40 mg Oral q1800 Gonzella Lex, MD      . thiamine (B-1) injection 100 mg  100 mg Intravenous Daily Orbie Pyo, MD   0 mg at 05/29/15 1202  . thiamine (VITAMIN B-1) tablet 100 mg  100 mg Oral Daily Orbie Pyo, MD   100 mg at 05/29/15 1202   Current Outpatient Prescriptions  Medication Sig Dispense Refill  . albuterol (PROVENTIL HFA;VENTOLIN HFA) 108 (90 BASE) MCG/ACT inhaler Inhale into the lungs every 6 (six) hours as needed for wheezing or  shortness of breath.    . citalopram (CELEXA) 20 MG tablet Take 1 tablet (20 mg total) by mouth daily. 30 tablet 5  . clobetasol cream (TEMOVATE) 4.56 % Apply 1 application topically daily. Apply thinly to affected psoriatic regions daily.    . DiphenhydrAMINE HCl, Sleep, 25 MG CAPS Take 50 mg by mouth at bedtime. 60 capsule 2  . losartan-hydrochlorothiazide (HYZAAR) 100-25 MG per tablet Take 1 tablet by mouth daily.    . metoprolol succinate (TOPROL-XL) 100 MG 24 hr tablet Take 100 mg by mouth daily. Take with or immediately following a meal.    .  OLANZapine (ZYPREXA) 15 MG tablet TAKE 1 TABLET BY MOUTH AT BEDTIME 30 tablet 3  . omeprazole (PRILOSEC) 40 MG capsule Take 40 mg by mouth every morning.    . simvastatin (ZOCOR) 40 MG tablet Take 40 mg by mouth daily.      Musculoskeletal: Strength & Muscle Tone: within normal limits Gait & Station: normal Patient leans: N/A  Psychiatric Specialty Exam: Physical Exam  Nursing note and vitals reviewed. Constitutional: He appears well-developed and well-nourished.  HENT:  Head: Normocephalic and atraumatic.  Eyes: Conjunctivae are normal. Pupils are equal, round, and reactive to light.  Neck: Normal range of motion.  Cardiovascular: Normal heart sounds.   Respiratory: Effort normal.  GI: Soft.  Musculoskeletal: Normal range of motion.  Neurological: He is alert.  Skin: Skin is warm, dry and intact. No lesion noted.    Review of Systems  Constitutional: Positive for malaise/fatigue.  HENT: Negative.   Eyes: Negative.   Respiratory: Negative.   Cardiovascular: Negative.   Gastrointestinal: Negative.   Musculoskeletal: Negative.   Skin: Negative.   Neurological: Positive for tremors.  Psychiatric/Behavioral: Positive for depression, suicidal ideas, hallucinations and substance abuse. The patient is nervous/anxious and has insomnia.     Blood pressure 193/88, pulse 107, temperature 98.6 F (37 C), temperature source Oral, resp. rate  20, height _0  (1.905 m), weight 149.687 kg (330 lb), SpO2 88 %.Body mass index is 41.25 kg/(m^2).  General Appearance: Casual  Eye Contact::  Fair  Speech:  Slow  Volume:  Decreased  Mood:  Dysphoric  Affect:  Depressed  Thought Process:  Circumstantial  Orientation:  Full (Time, Place, and Person)  Thought Content:  Hallucinations: Auditory Visual  Suicidal Thoughts:  Yes.  without intent/plan  Homicidal Thoughts:  No  Memory:  Immediate;   Fair Recent;   Fair Remote;   Fair  Judgement:  Intact  Insight:  Present  Psychomotor Activity:  Decreased  Concentration:  Fair  Recall:  [employer] [facility] [place]  Language: Fair  Akathisia:  No  Handed:  Right  AIMS (if indicated):     Assets:  Communication Skills Desire for Improvement Financial Resources/Insurance Housing Resilience Social Support  ADL's:  Intact  Cognition: WNL  Sleep:      Medical Decision Making: Review of Psycho-Social Stressors (1), Review or order clinical lab tests (1), Established Problem, Worsening (2), Review or order medicine tests (1), Review of Medication Regimen & Side Effects (2) and Review of New Medication or Change in Dosage (2)  Treatment Plan Summary: Daily contact with patient to assess and evaluate symptoms and progress in treatment, Medication management and Plan Patient well known to me with schizophrenia and alcohol abuse. I saw him just about a week ago in my office and at that time he had presented himself is doing pretty well but it sounds like he had been drinking heavily even then. He's having elevated blood pressure, tachycardia, some withdrawal symptoms. Also worsening hallucinations and passive suicidal thoughts. I think he would benefit and is appropriate for admission to the hospital. Detox orders in place. Continue his usual outpatient medicines. Reviewed labs. Reviewed vital signs. Patient will be admitted to the hospital once we have a bed available.  Plan:  Recommend  psychiatric Inpatient admission when medically cleared. Supportive therapy provided about ongoing stressors. Refer to IOP. Disposition: Admit to Hospital once we have a bed available continue current medications for now  Alethia Berthold 05/29/2015 9:35 PM

## 2015-05-29 NOTE — ED Notes (Signed)
BEHAVIORAL HEALTH ROUNDING Patient sleeping: Yes.   Patient alert and oriented: yes Behavior appropriate: Yes.  ;  Nutrition and fluids offered: Yes  Toileting and hygiene offered: Yes  Sitter present: yes Law enforcement present: Yes   ENVIRONMENTAL ASSESSMENT Potentially harmful objects out of patient reach: Yes.   Personal belongings secured: Yes.   Patient dressed in hospital provided attire only: Yes.   Plastic bags out of patient reach: Yes.   Patient care equipment (cords, cables, call bells, lines, and drains) shortened, removed, or accounted for: Yes.   Equipment and supplies removed from bottom of stretcher: Yes.   Potentially toxic materials out of patient reach: Yes.   Sharps container removed or out of patient reach: Yes.     

## 2015-05-29 NOTE — ED Notes (Signed)
BEHAVIORAL HEALTH ROUNDING Patient sleeping: No. Patient alert and oriented: yes Behavior appropriate: Yes.  ; If no, describe:  Nutrition and fluids offered: Yes  Toileting and hygiene offered: Yes  Sitter present: not applicable Law enforcement present: Yes   Patient reports some improvement in withdrawal sxs. Nausea subsiding, patient able to eat full meal. Tremors still present but less pronounced. BP remains elevated but patient states he has been off hypertensive meds for several days. Will continue to closely monitor. Patient on 15 minute checks for safety.

## 2015-05-29 NOTE — ED Notes (Signed)
BEHAVIORAL HEALTH ROUNDING Patient sleeping: Yes.   Patient alert and oriented: yes Behavior appropriate: Yes.  ;  Nutrition and fluids offered: Yes  Toileting and hygiene offered: Yes  Sitter present: yes Law enforcement present: Yes  

## 2015-05-29 NOTE — ED Notes (Signed)
Pt offered meal tray, refused at this time.  Pt up to bathroom, ua to lab.

## 2015-05-30 ENCOUNTER — Inpatient Hospital Stay
Admit: 2015-05-30 | Discharge: 2015-06-01 | DRG: 885 | Disposition: A | Discharge status: Home / Self Care | Payer: Medicare Other | Source: Intra-hospital | Attending: Psychiatry | Admitting: Psychiatry

## 2015-05-30 DIAGNOSIS — R7309 Other abnormal glucose: Secondary | ICD-10-CM | POA: Diagnosis present

## 2015-05-30 DIAGNOSIS — F200 Paranoid schizophrenia: Principal | ICD-10-CM | POA: Diagnosis present

## 2015-05-30 DIAGNOSIS — R443 Hallucinations, unspecified: Secondary | ICD-10-CM | POA: Diagnosis present

## 2015-05-30 DIAGNOSIS — Z823 Family history of stroke: Secondary | ICD-10-CM

## 2015-05-30 DIAGNOSIS — K219 Gastro-esophageal reflux disease without esophagitis: Secondary | ICD-10-CM | POA: Diagnosis present

## 2015-05-30 DIAGNOSIS — F10239 Alcohol dependence with withdrawal, unspecified: Secondary | ICD-10-CM | POA: Diagnosis not present

## 2015-05-30 DIAGNOSIS — Z8249 Family history of ischemic heart disease and other diseases of the circulatory system: Secondary | ICD-10-CM | POA: Diagnosis not present

## 2015-05-30 DIAGNOSIS — Z79899 Other long term (current) drug therapy: Secondary | ICD-10-CM | POA: Diagnosis not present

## 2015-05-30 DIAGNOSIS — I10 Essential (primary) hypertension: Secondary | ICD-10-CM | POA: Diagnosis present

## 2015-05-30 DIAGNOSIS — E785 Hyperlipidemia, unspecified: Secondary | ICD-10-CM | POA: Diagnosis present

## 2015-05-30 DIAGNOSIS — F1020 Alcohol dependence, uncomplicated: Secondary | ICD-10-CM | POA: Diagnosis present

## 2015-05-30 DIAGNOSIS — R45851 Suicidal ideations: Secondary | ICD-10-CM | POA: Diagnosis present

## 2015-05-30 LAB — TSH: TSH: 2.439 u[IU]/mL (ref 0.350–4.500)

## 2015-05-30 LAB — LIPID PANEL
CHOL/HDL RATIO: 10.9 ratio
CHOLESTEROL: 208 mg/dL — AB (ref 0–200)
HDL: 19 mg/dL — ABNORMAL LOW (ref >40)
LDL CALC: 164 mg/dL — AB (ref 0–99)
TRIGLYCERIDES: 126 mg/dL (ref <150)
VLDL: 25 mg/dL (ref 0–40)

## 2015-05-30 MED ORDER — VITAMIN B-1 100 MG PO TABS
100.0000 mg | ORAL_TABLET | Freq: Every day | ORAL | Status: DC
  Administered 2015-05-30 – 2015-05-31 (×2): 100 mg via ORAL
  Filled 2015-05-30 (×2): qty 1

## 2015-05-30 MED ORDER — THIAMINE HCL 100 MG/ML IJ SOLN
100.0000 mg | Freq: Every day | INTRAMUSCULAR | Status: DC
  Filled 2015-05-30 (×3): qty 1

## 2015-05-30 MED ORDER — ALUM & MAG HYDROXIDE-SIMETH 200-200-20 MG/5ML PO SUSP: 30.0000 mL | ORAL | Status: DC | PRN

## 2015-05-30 MED ORDER — CITALOPRAM HYDROBROMIDE 20 MG PO TABS
20.0000 mg | ORAL_TABLET | Freq: Every day | ORAL | Status: DC
  Administered 2015-05-30 – 2015-06-01 (×3): 20 mg via ORAL
  Filled 2015-05-30 (×3): qty 1

## 2015-05-30 MED ORDER — MAGNESIUM HYDROXIDE 400 MG/5ML PO SUSP: 30.0000 mL | Freq: Every day | ORAL | Status: DC | PRN

## 2015-05-30 MED ORDER — METOPROLOL SUCCINATE ER 100 MG PO TB24
100.0000 mg | ORAL_TABLET | Freq: Every day | ORAL | Status: DC
  Administered 2015-05-30 – 2015-06-01 (×3): 100 mg via ORAL
  Filled 2015-05-30 (×3): qty 1

## 2015-05-30 MED ORDER — OLANZAPINE 10 MG PO TABS
15.0000 mg | ORAL_TABLET | Freq: Every day | ORAL | Status: DC
  Administered 2015-05-30 – 2015-05-31 (×2): 15 mg via ORAL
  Filled 2015-05-30 (×2): qty 2

## 2015-05-30 MED ORDER — PANTOPRAZOLE SODIUM 40 MG PO TBEC
40.0000 mg | DELAYED_RELEASE_TABLET | Freq: Every day | ORAL | Status: DC
  Administered 2015-05-30 – 2015-06-01 (×3): 40 mg via ORAL
  Filled 2015-05-30 (×3): qty 1

## 2015-05-30 MED ORDER — TRIAMTERENE-HCTZ 37.5-25 MG PO TABS
1.0000 | ORAL_TABLET | Freq: Every day | ORAL | Status: DC
  Administered 2015-05-30 – 2015-06-01 (×3): 1 via ORAL
  Filled 2015-05-30 (×4): qty 1

## 2015-05-30 MED ORDER — ACETAMINOPHEN 325 MG PO TABS: 650.0000 mg | ORAL_TABLET | Freq: Four times a day (QID) | ORAL | Status: DC | PRN

## 2015-05-30 MED ORDER — LORAZEPAM 2 MG PO TABS: 0.0000 mg | ORAL_TABLET | Freq: Two times a day (BID) | ORAL | Status: DC

## 2015-05-30 MED ORDER — LORAZEPAM 2 MG/ML IJ SOLN: 0.0000 mg | Freq: Four times a day (QID) | INTRAMUSCULAR | Status: DC

## 2015-05-30 MED ORDER — LORAZEPAM 2 MG PO TABS
0.0000 mg | ORAL_TABLET | Freq: Four times a day (QID) | ORAL | Status: DC
  Administered 2015-05-30 – 2015-05-31 (×2): 1 mg via ORAL
  Filled 2015-05-30: qty 1

## 2015-05-30 MED ORDER — LORAZEPAM 2 MG/ML IJ SOLN: 0.0000 mg | Freq: Two times a day (BID) | INTRAMUSCULAR | Status: AC

## 2015-05-30 MED ORDER — SIMVASTATIN 20 MG PO TABS
40.0000 mg | ORAL_TABLET | Freq: Every day | ORAL | Status: DC
  Administered 2015-05-31: 40 mg via ORAL
  Filled 2015-05-30: qty 2

## 2015-05-30 NOTE — ED Notes (Signed)
No change in condition, will continue to monitor.  

## 2015-05-30 NOTE — ED Notes (Signed)

## 2015-05-30 NOTE — ED Notes (Signed)
Pt laying in bed.  

## 2015-05-30 NOTE — ED Notes (Signed)
BEHAVIORAL HEALTH ROUNDING Patient sleeping: No. Patient alert and oriented: yes Behavior appropriate: Yes.  ; If no, describe:  Nutrition and fluids offered: Yes  Toileting and hygiene offered: Yes  Sitter present: no Law enforcement present: Yes  

## 2015-05-30 NOTE — Progress Notes (Signed)
Recreation Therapy Notes  Date: 09.07.16 Time: 3:00 pm Location: Craft Room  Group Topic: Self-esteem  Goal Area(s) Addresses:  Patients will write at least one positive trait.  Patients will verbalize benefit of having good self-esteem.  Behavioral Response: Attentive, Interactive  Intervention: I Am  Activity: Patients were given a worksheet with the letter I on it and instructed to write as many positive traits about themselves inside the letter.  Education:LRT educated patients on ways to increase their self-esteem.   Education Outcome: In group clarification offered  Clinical Observations/Feedback: Patient completed approximately 30% of worksheet. Patient contributed to group discussion by stating that it was difficult to think of positive traits and why.  Jacquelynn Cree, LRT/CTRS 05/30/2015 4:36 PM

## 2015-05-30 NOTE — ED Provider Notes (Signed)
-----------------------------------------   4:41 AM on 05/30/2015 -----------------------------------------   Blood pressure 193/88, pulse 107, temperature 98.6 F (37 C), temperature source Oral, resp. rate 20, height  (1.905 m), weight 330 lb (149.687 kg), SpO2 88 %.  The patient had no acute events since last update.  Calm and cooperative at this time.  The note from [name] yesterday states that they are planning to admit him on a bed is available, so I change the disposition to discharge-readmit and reflected the diagnosis of paranoid schizophrenia in the clinical impression tab.     Loleta Rose, MD 05/30/15 [phone]

## 2015-05-30 NOTE — ED Notes (Signed)

## 2015-05-30 NOTE — [facility] Group Notes (Signed)
[facility] LCSW Group Therapy  05/30/2015 2:15 PM  Type of Therapy:  Group Therapy  Participation Level:  Did Not Attend  Modes of Intervention:  Discussion, Education, Socialization and Support  Summary of Progress/Problems:Emotional Regulation: Patients will identify both negative and positive emotions. They will discuss emotions they have difficulty regulating and how they impact their lives. Patients will be asked to identify healthy coping skills to combat unhealthy reactions to negative emotions.     Devlin Mcveigh L Amaal Dimartino MSW, LCSWA  05/30/2015, 2:15 PM

## 2015-05-30 NOTE — ED Notes (Signed)
Patient resting comfortably in room. No complaints or concerns voiced. No distress or abnormal behavior noted. Will continue to monitor with security cameras. Q 15 minute rounds continue. 

## 2015-05-30 NOTE — [facility] Counselor (Signed)
Pt. is to be admitted to [facility] by [name]. Attending Physician will be Dr. Jennet Maduro.  Pt. has been assigned to room 320, by [facility] Charge Nurse Silsbee F.  Intake Paper Work has been signed and placed on pt. chart. ER staff [facility].; Dr. Juliette Alcide, ER MD; Judeth Cornfield Patient's Nurse & Clotilde Dieter L. Patient Access) have been made aware of the admission.   05/30/2015 Cheryl Flash, NCC, LPCA

## 2015-05-30 NOTE — Tx Team (Signed)
Initial Interdisciplinary Treatment Plan   PATIENT STRESSORS: Financial difficulties Loss of father  Substance abuse   PATIENT STRENGTHS: Ability for insight Communication skills Supportive family/friends   PROBLEM LIST: Problem List/Patient Goals Date to be addressed Date deferred Reason deferred Estimated date of resolution  Schizophrenia       Alcohol abuse       Breathing issues                                            DISCHARGE CRITERIA:  Ability to meet basic life and health needs Improved stabilization in mood, thinking, and/or behavior Verbal commitment to aftercare and medication compliance  PRELIMINARY DISCHARGE PLAN: Attend aftercare/continuing care group Attend 12-step recovery group  PATIENT/FAMIILY INVOLVEMENT: This treatment plan has been presented to and reviewed with the patient, Howard [name].  The patient and family have been given the opportunity to ask questions and make suggestions.  [name] Clearance [name] 05/30/2015, 3:00 PM

## 2015-05-30 NOTE — ED Notes (Signed)
ED BHU PLACEMENT JUSTIFICATION Is the patient under IVC or is there intent for IVC: No. Is the patient medically cleared: Yes.   Is there vacancy in the ED BHU: Yes.   Is the population mix appropriate for patient: Yes.   Is the patient awaiting placement in inpatient or outpatient setting: Yes.   Has the patient had a psychiatric consult: Yes.   Survey of unit performed for contraband, proper placement and condition of furniture, tampering with fixtures in bathroom, shower, and each patient room: Yes.  ; Findings: all clear APPEARANCE/BEHAVIOR calm, cooperative and adequate rapport can be established NEURO ASSESSMENT Orientation: time, place and person Hallucinations: Yes.  Auditory Hallucinations and Visual Hallucinations Speech: Normal Gait: normal RESPIRATORY ASSESSMENT Normal expansion.  Clear to auscultation.  No rales, rhonchi, or wheezing. CARDIOVASCULAR ASSESSMENT regular rate and rhythm, S1, S2 normal, no murmur, click, rub or gallop GASTROINTESTINAL ASSESSMENT soft, nontender, BS WNL, no r/g EXTREMITIES normal strength, tone, and muscle mass, ROM of all joints is normal PLAN OF CARE Provide calm/safe environment. Vital signs assessed twice daily. ED BHU Assessment once each 12-hour shift. Collaborate with intake RN daily or as condition indicates. Assure the ED provider has rounded once each shift. Provide and encourage hygiene. Provide redirection as needed. Assess for escalating behavior; address immediately and inform ED provider.  Assess family dynamic and appropriateness for visitation as needed: Yes.  ; If necessary, describe findings:  Educate the patient/family about BHU procedures/visitation: Yes.  ; If necessary, describe findings:

## 2015-05-30 NOTE — Plan of Care (Signed)
Problem: Ineffective individual coping Goal: STG: Patient will participate in after care plan Outcome: Progressing Patient states he will participate in patients treatment and wants to quit drinking and get life back on track.

## 2015-05-30 NOTE — Progress Notes (Signed)
Patient arrived to the unit today from the ED, presenting with alcohol abuse.  Patient in scrubs with a flat affect, appearing anxious and fearful.  Patient orientated to the unit and the unit rules and polices.  Skin and belongings searched with no contraband found.  Patient instructed of his care plan.  Patient stated he drinks a 5th a day. Patient with a hx of schizophrenia and hypertension.  Patient states he will follow with his care/ treatment plan.  No suicidal or homicidal thoughts present at this time.

## 2015-05-30 NOTE — ED Notes (Signed)
BEHAVIORAL HEALTH ROUNDING Patient sleeping: Yes.   Patient alert and oriented: pt sleeping Behavior appropriate: Yes.  ; If no, describe: Nutrition and fluids offered: Yes  Toileting and hygiene offered: Yes  Sitter present: no Law enforcement present: Yes  

## 2015-05-31 ENCOUNTER — Encounter: Payer: Self-pay | Admitting: Psychiatry

## 2015-05-31 DIAGNOSIS — F200 Paranoid schizophrenia: Principal | ICD-10-CM

## 2015-05-31 DIAGNOSIS — F1020 Alcohol dependence, uncomplicated: Secondary | ICD-10-CM | POA: Diagnosis present

## 2015-05-31 LAB — GLUCOSE, CAPILLARY: GLUCOSE-CAPILLARY: 117 mg/dL — AB (ref 65–99)

## 2015-05-31 LAB — HEMOGLOBIN A1C: HEMOGLOBIN A1C: 6.1 % — AB (ref 4.0–6.0)

## 2015-05-31 MED ORDER — CHLORDIAZEPOXIDE HCL 10 MG PO CAPS
10.0000 mg | ORAL_CAPSULE | Freq: Three times a day (TID) | ORAL | Status: DC
  Administered 2015-05-31 – 2015-06-01 (×3): 10 mg via ORAL
  Filled 2015-05-31 (×3): qty 1

## 2015-05-31 MED ORDER — METFORMIN HCL 500 MG PO TABS
500.0000 mg | ORAL_TABLET | Freq: Two times a day (BID) | ORAL | Status: DC
  Administered 2015-05-31 – 2015-06-01 (×2): 500 mg via ORAL
  Filled 2015-05-31 (×2): qty 1

## 2015-05-31 NOTE — Progress Notes (Signed)
D: Pt is awake in bed this evening. Pt mood is depressed and his affect is sad. Pt denies SI/HI and AVH since he arrived on the unit.   A: Writer provided emotional support and administered medications as prescribed.  R: Pt is taking medications as prescribed, but has limited interaction with staff and peers. He is pleasant and cooperative. Pt returned to bed following medication administration.

## 2015-05-31 NOTE — [facility] Group Notes (Signed)
[facility] LCSW Group Therapy  05/31/2015 4:20 PM  Type of Therapy:  Group Therapy  Participation Level:  Minimal  Participation Quality:  Attentive  Affect:  Flat   Cognitive:  Alert  Insight:  Improving  Engagement in Therapy:  Improving  Modes of Intervention:  Discussion, Education, Socialization and Support  Summary of Progress/Problems: Balance in life: Patients will discuss the concept of balance and how it looks and feels to be unbalanced. Pt will identify areas in their life that is unbalanced and ways to become more balanced.  Shrihan attended group and stayed the entire time. He sat quietly and listened to other group members.   Chanay Nugent L Asuka Dusseau MSW, LCSWA  05/31/2015, 4:20 PM

## 2015-05-31 NOTE — H&P (Signed)
Psychiatric Admission Assessment Adult  Patient Identification: Howard [name] MRN:  [id number] Date of Evaluation:  05/31/2015 Chief Complaint:  Schizophrenia Principal Diagnosis: Paranoid schizophrenia Diagnosis:   Patient Active Problem List   Diagnosis Date Noted  . Alcohol use disorder, moderate, dependence [F10.20] 05/31/2015  . Hypertension [I10] 05/29/2015  . Paranoid schizophrenia [F20.0] 03/20/2015   History of Present Illness:   Identifying data. Howard [name] is a 34 year old male with a history of schizophrenia and alcohol dependence.  Chief complaint. "It's good to wake up clear headed."  History of present illness. Howard [name] has a long history of schizophrenia.  He is in the care of [name] maintained on a combination of Zyprexa and Celexa. For the past 3 years or so the patient has been drinking. He is drinking escalated recently to daily use of the court of liquor. He drinks during the day and consumes all his alcohol between 6:00 and 10:00 PM. He became increasingly paranoid and hallucinating in spite of good treatment compliance. He believed that there was a ghost in the house that frighten him. He no longer believes this was a ghost. He has been off alcohol for 3 days now and his thinking is clear now. He denies any symptoms of depression, anxiety, or psychosis. Denies symptoms suggestive of bipolar mania. He denies other than alcohol substance use. He was positive for benzodiazepine on admission. He admits that he uses one of his mother's benzos when he suffered shoulder pain.   Past psychiatric history. History of schizophrenia. He was tried on different medications but believes that current combination of Zyprexa and Celexa will best for him. There was suicide attempt in the past. He was hospitalized several times for worsening of psychosis and drinking. He believes that now is the time for him to stop. He is interested in substance abuse treatment.   Family psychiatric  history. Nonreported.  Social history. He is disabled from mental illness. He lives with his brother. His mother who lives nearby is very involved. He was diagnosed with prediabetes. His hemoglobin A1c is not elevated. He is interested in starting metformin to prevent diabetes as long as it is covered by his insurance.  Total Time spent with patient: 1 hour  Past Medical History:  Past Medical History  Diagnosis Date  . Hypertension   . Schizophrenia   . Hyperlipemia    History reviewed. No pertinent past surgical history. Family History:  Family History  Problem Relation Age of Onset  . Heart disease Mother   . Hypertension Mother   . Hyperlipidemia Mother   . Stroke Father   . Heart attack Father   . Hypertension Father   . Heart disease Brother    Social History:  History  Alcohol Use  . 3.0 oz/week  . 5 Shots of liquor, 0 Standard drinks or equivalent per week     History  Drug Use No    Social History   Social History  . Marital Status: Single    Spouse Name: N/A  . Number of Children: N/A  . Years of Education: N/A   Social History Main Topics  . Smoking status: Never Smoker   . Smokeless tobacco: Never Used  . Alcohol Use: 3.0 oz/week    5 Shots of liquor, 0 Standard drinks or equivalent per week  . Drug Use: No  . Sexual Activity: No   Other Topics Concern  . None   Social History Narrative   Additional Social History:  Musculoskeletal: Strength & Muscle Tone: within normal limits Gait & Station: normal Patient leans: N/A  Psychiatric Specialty Exam: Physical Exam  Nursing note and vitals reviewed.   Review of Systems  Neurological: Positive for tremors.  All other systems reviewed and are negative.   Blood pressure 123/79, pulse 91, temperature 99.1 F (37.3 C), temperature source Oral, resp. rate 16, height 6\' 3"  (1.905 m), weight 151.955 kg (335 lb), SpO2 95 %.Body mass index is 41.87 kg/(m^2).  See  SRA.                                                  Sleep:  Number of Hours: 6   Risk to Self: Is patient at risk for suicide?: No Risk to Others:   Prior Inpatient Therapy:   Prior Outpatient Therapy:    Alcohol Screening: 1. How often do you have a drink containing alcohol?: 4 or more times a week 2. How many drinks containing alcohol do you have on a typical day when you are drinking?: 10 or more 3. How often do you have six or more drinks on one occasion?: Daily or almost daily Preliminary Score: 8 4. How often during the last year have you found that you were not able to stop drinking once you had started?: Daily or almost daily 5. How often during the last year have you failed to do what was normally expected from you becasue of drinking?: Daily or almost daily 6. How often during the last year have you needed a first drink in the morning to get yourself going after a heavy drinking session?: Daily or almost daily 7. How often during the last year have you had a feeling of guilt of remorse after drinking?: Never 8. How often during the last year have you been unable to remember what happened the night before because you had been drinking?: Daily or almost daily 9. Have you or someone else been injured as a result of your drinking?: Yes, during the last year 10. Has a relative or friend or a doctor or another health worker been concerned about your drinking or suggested you cut down?: Yes, during the last year Alcohol Use Disorder Identification Test Final Score (AUDIT): 36 Brief Intervention: Yes  Allergies:  No Known Allergies Lab Results:  Results for orders placed or performed during the hospital encounter of 05/30/15 (from the past 48 hour(s))  Hemoglobin A1c     Status: Abnormal   Collection Time: 05/30/15  7:50 PM  Result Value Ref Range   Hgb A1c MFr Bld 6.1 (H) 4.0 - 6.0 %  Lipid panel, fasting     Status: Abnormal   Collection Time: 05/30/15   7:50 PM  Result Value Ref Range   Cholesterol 208 (H) 0 - 200 mg/dL   Triglycerides 161 <096 mg/dL   HDL 19 (L) >04 mg/dL   Total CHOL/HDL Ratio 10.9 RATIO   VLDL 25 0 - 40 mg/dL   LDL Cholesterol 540 (H) 0 - 99 mg/dL    Comment:        Total Cholesterol/HDL:CHD Risk Coronary Heart Disease Risk Table                     Men   Women  1/2 Average Risk   3.4   3.3  Average Risk  5.0   4.4  2 X Average Risk   9.6   7.1  3 X Average Risk  23.4   11.0        Use the calculated Patient Ratio above and the CHD Risk Table to determine the patient's CHD Risk.        ATP III CLASSIFICATION (LDL):  <100     mg/dL   Optimal  161-096  mg/dL   Near or Above                    Optimal  130-159  mg/dL   Borderline  045-409  mg/dL   High  >811     mg/dL   Very High   TSH     Status: None   Collection Time: 05/30/15  7:50 PM  Result Value Ref Range   TSH 2.439 0.350 - 4.500 uIU/mL   Current Medications: Current Facility-Administered Medications  Medication Dose Route Frequency Provider Last Rate Last Dose  . acetaminophen (TYLENOL) tablet 650 mg  650 mg Oral Q6H PRN Audery Amel, MD      . alum & mag hydroxide-simeth (MAALOX/MYLANTA) 200-200-20 MG/5ML suspension 30 mL  30 mL Oral Q4H PRN Audery Amel, MD      . citalopram (CELEXA) tablet 20 mg  20 mg Oral Daily Audery Amel, MD   20 mg at 05/31/15 0945  . LORazepam (ATIVAN) injection 0-4 mg  0-4 mg Intravenous 4 times per day Audery Amel, MD   0 mg at 05/30/15 1957  . LORazepam (ATIVAN) injection 0-4 mg  0-4 mg Intravenous Q12H Audery Amel, MD   0 mg at 05/30/15 1957  . LORazepam (ATIVAN) tablet 0-4 mg  0-4 mg Oral 4 times per day Audery Amel, MD   1 mg at 05/31/15 0657  . LORazepam (ATIVAN) tablet 0-4 mg  0-4 mg Oral Q12H Audery Amel, MD   0 mg at 05/30/15 2230  . magnesium hydroxide (MILK OF MAGNESIA) suspension 30 mL  30 mL Oral Daily PRN Audery Amel, MD      . metoprolol succinate (TOPROL-XL) 24 hr tablet 100  mg  100 mg Oral Daily Audery Amel, MD   100 mg at 05/31/15 0945  . OLANZapine (ZYPREXA) tablet 15 mg  15 mg Oral QHS Audery Amel, MD   15 mg at 05/30/15 2241  . pantoprazole (PROTONIX) EC tablet 40 mg  40 mg Oral Daily Audery Amel, MD   40 mg at 05/31/15 0945  . simvastatin (ZOCOR) tablet 40 mg  40 mg Oral q1800 Audery Amel, MD      . thiamine (B-1) injection 100 mg  100 mg Intravenous Daily Audery Amel, MD   100 mg at 05/30/15 1958  . thiamine (VITAMIN B-1) tablet 100 mg  100 mg Oral Daily Audery Amel, MD   100 mg at 05/31/15 0945  . triamterene-hydrochlorothiazide (MAXZIDE-25) 37.5-25 MG per tablet 1 tablet  1 tablet Oral Daily Audery Amel, MD   1 tablet at 05/31/15 0945   PTA Medications: Prescriptions prior to admission  Medication Sig Dispense Refill Last Dose  . albuterol (PROVENTIL HFA;VENTOLIN HFA) 108 (90 BASE) MCG/ACT inhaler Inhale into the lungs every 6 (six) hours as needed for wheezing or shortness of breath.   Past Week at Unknown time  . citalopram (CELEXA) 20 MG tablet Take 1 tablet (20 mg total) by mouth daily. 30 tablet 5 05/30/2015 at  Unknown time  . clobetasol cream (TEMOVATE) 0.05 % Apply 1 application topically daily. Apply thinly to affected psoriatic regions daily.   05/29/2015 at Unknown time  . DiphenhydrAMINE HCl, Sleep, 25 MG CAPS Take 50 mg by mouth at bedtime. 60 capsule 2 05/29/2015 at Unknown time  . losartan-hydrochlorothiazide (HYZAAR) 100-25 MG per tablet Take 1 tablet by mouth daily.   Past Month at Unknown time  . Lurasidone HCl (LATUDA) 60 MG TABS Take 2 tablets by mouth daily.     . metoprolol succinate (TOPROL-XL) 100 MG 24 hr tablet Take 100 mg by mouth daily. Take with or immediately following a meal.   Past Week at Unknown time  . OLANZapine (ZYPREXA) 15 MG tablet TAKE 1 TABLET BY MOUTH AT BEDTIME 30 tablet 3 05/30/2015 at Unknown time  . omeprazole (PRILOSEC) 40 MG capsule Take 40 mg by mouth every morning.   05/30/2015 at Unknown time  .  simvastatin (ZOCOR) 40 MG tablet Take 40 mg by mouth daily.   05/30/2015 at Unknown time    Previous Psychotropic Medications: Yes   Substance Abuse History in the last 12 months:  Yes.      Consequences of Substance Abuse: Negative  Results for orders placed or performed during the hospital encounter of 05/30/15 (from the past 72 hour(s))  Hemoglobin A1c     Status: Abnormal   Collection Time: 05/30/15  7:50 PM  Result Value Ref Range   Hgb A1c MFr Bld 6.1 (H) 4.0 - 6.0 %  Lipid panel, fasting     Status: Abnormal   Collection Time: 05/30/15  7:50 PM  Result Value Ref Range   Cholesterol 208 (H) 0 - 200 mg/dL   Triglycerides 865 <784 mg/dL   HDL 19 (L) >69 mg/dL   Total CHOL/HDL Ratio 10.9 RATIO   VLDL 25 0 - 40 mg/dL   LDL Cholesterol 629 (H) 0 - 99 mg/dL    Comment:        Total Cholesterol/HDL:CHD Risk Coronary Heart Disease Risk Table                     Men   Women  1/2 Average Risk   3.4   3.3  Average Risk       5.0   4.4  2 X Average Risk   9.6   7.1  3 X Average Risk  23.4   11.0        Use the calculated Patient Ratio above and the CHD Risk Table to determine the patient's CHD Risk.        ATP III CLASSIFICATION (LDL):  <100     mg/dL   Optimal  528-413  mg/dL   Near or Above                    Optimal  130-159  mg/dL   Borderline  244-010  mg/dL   High  >272     mg/dL   Very High   TSH     Status: None   Collection Time: 05/30/15  7:50 PM  Result Value Ref Range   TSH 2.439 0.350 - 4.500 uIU/mL    Observation Level/Precautions:  Detox 15 minute checks  Laboratory:  CBC Chemistry Profile UDS UA  Psychotherapy:    Medications:    Consultations:    Discharge Concerns:    Estimated LOS:  Other:     Psychological Evaluations: No   Treatment Plan Summary: Daily contact with  patient to assess and evaluate symptoms and progress in treatment and Medication management  Medical Decision Making:  New problem, with additional work up planned, Review  of Psycho-Social Stressors (1), Review or order clinical lab tests (1), Review of Medication Regimen & Side Effects (2) and Review of New Medication or Change in Dosage (2)   Mr. Hausen has a history of schizophrenia and alcohol abuse admitted for worsening of psychosis and alcohol detox.  1. Suicidal ideation. He still feels suicidal but is able to contract for safety in the hospital.  2. Mood and psychosis. We continued Zyprexa and Celexa as in the community.   3. Alcohol detox. He is on the CIWA protocol. Will monitor for symptoms of alcohol withdrawal.  4. Hypertension. We continue metoprolol and maxide.   5. Dyslipidemia. We'll continue simvastatin.  6. GERD. We will continue Protonix.  7. Substance abuse treatment. The patient is not interested in residential treatment.  8. Discharge planning. He will be discharged back to home with his brother. He will follow up with his regular psychiatrist.   I certify that inpatient services furnished can reasonably be expected to improve the patient's condition.   Jolanta Pucilowska 9/8/201612:52 PM

## 2015-05-31 NOTE — Progress Notes (Signed)
Recreation Therapy Notes  INPATIENT RECREATION THERAPY ASSESSMENT  Patient Details Name: Howard [name] MRN: [id number] DOB: [birth year] Today's Date: 05/31/2015  Patient Stressors: Other (Comment) (Bills)  Coping Skills:   Substance Abuse, Talking, Music  Personal Challenges: Concentration, Decision-Making, Stress Management, Substance Abuse, Time Management  Leisure Interests (2+):  Individual - Other (Comment) (Sing and work on cars)  Biochemist, clinical Resources:  Yes  Community Resources:  [place]  Current Use: Yes  If no, Barriers?:    Patient Strengths:  Positive, considerate  Patient Identified Areas of Improvement:  Time management  Current Recreation Participation:  Karaoke, TV  Patient Goal for Hospitalization:  To successfully quit drinking  [place] of Residence:  [place] of Residence:  [place]   Current [place] (including self-harm):  No  Current HI:  No  Consent to Intern Participation: N/A   Jacquelynn Cree, LRT/CTRS 05/31/2015, 5:28 PM

## 2015-05-31 NOTE — Plan of Care (Signed)
Problem: Ineffective individual coping Goal: STG: Patient will remain free from self harm Outcome: Progressing Patient denies suicidal ideation at this time. No self harm.      

## 2015-05-31 NOTE — Progress Notes (Signed)
Recreation Therapy Notes  Date: 09.08.16 Time: 4:40 pm Location: Craft Room  Group Topic: Leisure Education  Goal Area(s) Addresses:  Patient will identify activities for each letter of the alphabet. Patient will verbalize ability to integrate positive leisure into life post d/c. Patient will verbalize ability to use leisure as a Associate Professor.  Behavioral Response: Attentive, Interactive  Intervention: Leisure Alphabet  Activity: Patients were given a Leisure Information systems manager and instructed to write a healthy leisure activity for each letter of the alphabet.   Education: LRT educated patient on things that are needed to participate in leisure  Education Outcome: In group clarification offered  Clinical Observations/Feedback: Patient completed approximately 90% of worksheet. Patient contributed to group discussion by stating some healthy leisure activities he wrote down.  Jacquelynn Cree, LRT/CTRS 05/31/2015 4:57 PM

## 2015-05-31 NOTE — Tx Team (Signed)
Interdisciplinary Treatment Plan Update (Adult)  Date:  05/31/2015 Time Reviewed:  5:04 PM  Progress in Treatment: Attending groups: Yes. Participating in groups:  Yes. Taking medication as prescribed:  Yes. Tolerating medication:  Yes. Family/Significant othe contact made:  Yes, individual(s) contacted:  Brother Patient understands diagnosis:  Yes. Discussing patient identified problems/goals with staff:  Yes. Medical problems stabilized or resolved:  Yes. Denies suicidal/homicidal ideation: Yes. Issues/concerns per patient self-inventory:  No. Other:  New problem(s) identified: No, Describe:  Na  Discharge Plan or Barriers: Pt [plans to return home and follow up with outpatient.   Reason for Continuation of Hospitalization: Depression Medication stabilization Withdrawal symptoms  Comments:Mr. Birdsall has a long history of schizophrenia. He is in the care of Dr. Weber Cooks maintained on a combination of Zyprexa and Celexa. For the past 3 years or so the patient has been drinking. He is drinking escalated recently to daily use of the court of liquor. He drinks during the day and consumes all his alcohol between 6:00 and 10:00 PM. He became increasingly paranoid and hallucinating in spite of good treatment compliance. He believed that there was a ghost in the house that frighten him. He no longer believes this was a ghost. He has been off alcohol for 3 days now and his thinking is clear now. He denies any symptoms of depression, anxiety, or psychosis. Denies symptoms suggestive of bipolar mania. He denies other than alcohol substance use. He was positive for benzodiazepine on admission. He admits that he uses one of his mother's benzos when he suffered shoulder pain.    Estimated length of stay: Possible d/c tomorrow   New goal(s):  Review of initial/current patient goals per problem list:   1.  Goal(s): Patient will participate in aftercare plan * Met:  * Target date: at  discharge * As evidenced by: Patient will participate within aftercare plan AEB aftercare provider and housing plan at discharge being identified.   2.  Goal (s): Patient will exhibit decreased depressive symptoms and suicidal ideations. * Met:  *  Target date: at discharge * As evidenced by: Patient will utilize self rating of depression at 3 or below and demonstrate decreased signs of depression or be deemed stable for discharge by MD.   3.  Goal(s): Patient will demonstrate decreased signs of withdrawal due to substance abuse * Met:  * Target date: at discharge * As evidenced by: Patient will produce a CIWA/COWS score of 0, have stable vitals signs, and no symptoms of withdrawal.  Attendees: Patient:  Howard [name] 9/8/20165:04 PM  Family:   9/8/20165:04 PM  Physician:  Dr. Bary Leriche  9/8/20165:04 PM  Nursing:   Elige Radon, RN  9/8/20165:04 PM  Case Manager:   9/8/20165:04 PM  Counselor:   9/8/20165:04 PM  Other:  Wray Kearns, LCSWA 9/8/20165:04 PM  Other:  Everitt Amber, [place]  9/8/20165:04 PM  Other:   9/8/20165:04 PM  Other:  9/8/20165:04 PM  Other:  9/8/20165:04 PM  Other:  9/8/20165:04 PM  Other:  9/8/20165:04 PM  Other:  9/8/20165:04 PM  Other:  9/8/20165:04 PM  Other:   9/8/20165:04 PM   Scribe for Treatment Team:   Campbell Stall Aaima Gaddie,MSW, LCSWA  05/31/2015, 5:04 PM

## 2015-05-31 NOTE — Progress Notes (Signed)
D- Patient denies homicidal and suicidal ideation. Has trembling of the hands bilaterally with CIWA of 2 but otherwise shows no symptoms of withdrawal. Maintains subdued affect but does smile occasionally. States, "I feel like my old self again". Attends meals and groups, occasional socialization with peers. Compliant with medication.  A- Monitored for safety, therapeutic communication and active listening.   R- Patient does not have any complaints at this time.

## 2015-05-31 NOTE — [facility] Suicide Risk Assessment (Signed)
[facility] Admission Suicide Risk Assessment   Nursing information obtained from:    Demographic factors:    Current Mental Status:    Loss Factors:    Historical Factors:    Risk Reduction Factors:    Total Time spent with patient: 1 hour Principal Problem: Paranoid schizophrenia Diagnosis:   Patient Active Problem List   Diagnosis Date Noted  . Alcohol use disorder, moderate, dependence [F10.20] 05/31/2015  . Hypertension [I10] 05/29/2015  . Paranoid schizophrenia [F20.0] 03/20/2015     Continued Clinical Symptoms:  Alcohol Use Disorder Identification Test Final Score (AUDIT): 36 The "Alcohol Use Disorders Identification Test", Guidelines for Use in Primary Care, Second Edition.  World Science writer [facility]). Score between 0-7:  no or low risk or alcohol related problems. Score between 8-15:  moderate risk of alcohol related problems. Score between 16-19:  high risk of alcohol related problems. Score 20 or above:  warrants further diagnostic evaluation for alcohol dependence and treatment.   CLINICAL FACTORS:   Alcohol/Substance Abuse/Dependencies Schizophrenia:   Depressive state Less than 67 years old Paranoid or undifferentiated type   Musculoskeletal: Strength & Muscle Tone: within normal limits Gait & Station: normal Patient leans: N/A  Psychiatric Specialty Exam: Physical Exam  Nursing note and vitals reviewed.   Review of Systems  All other systems reviewed and are negative.   Blood pressure 123/79, pulse 91, temperature 99.1 F (37.3 C), temperature source Oral, resp. rate 16, height 6\' 3"  (1.905 m), weight 151.955 kg (335 lb), SpO2 95 %.Body mass index is 41.87 kg/(m^2).  General Appearance: Casual  Eye Contact::  Fair  Speech:  Clear and Coherent  Volume:  Normal  Mood:  Depressed  Affect:  Flat  Thought Process:  Linear  Orientation:  Full (Time, Place, and Person)  Thought Content:  Hallucinations: Auditory  Suicidal Thoughts:  Yes.  with  intent/plan  Homicidal Thoughts:  No  Memory:  Immediate;   Fair Recent;   Fair Remote;   Fair  Judgement:  Fair  Insight:  Fair  Psychomotor Activity:  Decreased  Concentration:  Fair  Recall:  [employer] of Knowledge:Fair  Language: Fair  Akathisia:  No  Handed:  Right  AIMS (if indicated):     Assets:  Communication Skills Desire for Improvement Financial Resources/Insurance Housing Social Support  Sleep:  Number of Hours: 6  Cognition: WNL  ADL's:  Intact     COGNITIVE FEATURES THAT CONTRIBUTE TO RISK:  None    SUICIDE RISK:   Moderate:  Frequent suicidal ideation with limited intensity, and duration, some specificity in terms of plans, no associated intent, good self-control, limited dysphoria/symptomatology, some risk factors present, and identifiable protective factors, including available and accessible social support.  PLAN OF CARE: Hospital admission, medication management, alcohol detox, substance abuse counseling, discharge planning.  Medical Decision Making:  New problem, with additional work up planned, Review of Psycho-Social Stressors (1), Review or order clinical lab tests (1), Review of Medication Regimen & Side Effects (2) and Review of New Medication or Change in Dosage (2)    Howard [name] has a history of schizophrenia and alcohol abuse admitted for worsening of psychosis and alcohol detox.  1. Suicidal ideation. He still feels suicidal but is able to contract for safety in the hospital.  2. Mood and psychosis. We continued Zyprexa and Celexa as in the community.   3. Alcohol detox. He is on the CIWA protocol. Will monitor for symptoms of alcohol withdrawal.  4. Hypertension. We continue  metoprolol and maxide.   5. Dyslipidemia. We'll continue simvastatin.  6. GERD. We will continue Protonix.  7. Substance abuse treatment. The patient is not interested in residential treatment.  8. Discharge planning. He will be discharged back to home with his  brother. He will follow up with his regular psychiatrist.  I certify that inpatient services furnished can reasonably be expected to improve the patient's condition.   Howard [name] 05/31/2015, 12:47 PM

## 2015-05-31 NOTE — [facility] Group Notes (Signed)
[facility] Group Notes:  (Nursing/MHT/Case Management/Adjunct)  Date:  05/31/2015  Time:  2:26 PM  Type of Therapy:  Group Therapy  Participation Level:  Active  Participation Quality:  Appropriate  Affect:  Appropriate  Cognitive:  Appropriate  Insight:  Good  Engagement in Group:  Supportive  Modes of Intervention:  Support  Summary of Progress/Problems:  Howard [name] 05/31/2015, 2:26 PM

## 2015-06-01 ENCOUNTER — Other Ambulatory Visit: Payer: Self-pay | Admitting: Psychiatry

## 2015-06-01 LAB — GLUCOSE, CAPILLARY: GLUCOSE-CAPILLARY: 88 mg/dL (ref 65–99)

## 2015-06-01 MED ORDER — THIAMINE HCL 100 MG/ML IJ SOLN: 100.0000 mg | Freq: Every day | INTRAMUSCULAR | Status: DC

## 2015-06-01 MED ORDER — METFORMIN HCL 500 MG PO TABS: 500.0000 mg | ORAL_TABLET | Freq: Two times a day (BID) | ORAL | Status: DC

## 2015-06-01 MED ORDER — VITAMIN B-1 100 MG PO TABS
100.0000 mg | ORAL_TABLET | Freq: Every day | ORAL | Status: DC
  Administered 2015-06-01: 100 mg via ORAL
  Filled 2015-06-01: qty 1

## 2015-06-01 NOTE — Plan of Care (Signed)
Problem: [facility] Participation in Recreation Therapeutic Interventions Goal: STG-Patient will identify at least five coping skills for ** STG: Coping Skills - Within 3 treatment sessions, patient will verbalize at least 5 coping skills for substance abuse in one treatment session to decrease substance abuse post d/c.  Outcome: Completed/Met Date Met:  06/01/15 Treatment Session 1; Completed 1 out of 1: At approximately 11:50 am, LRT met with patient in craft room. Patient verbalized 5 coping skills for substance abuse. LRT educated patient on leisure and why it is important to implement into his schedule. LRT provided patient with blank schedules to help him plan his day and try to avoid using substances. LRT educated patient on healthy support systems.  Leonette Monarch, LRT/CTRS 09.09.16 2:23 pm Goal: STG-Other Recreation Therapy Goal (Specify) STG: Stress Management - Within 3 treatment sessions, patient will verbalize understanding of the stress management techniques in one treatment session to increase stress management skills post d/c.  Outcome: Completed/Met Date Met:  06/01/15 Treatment Session 1; Completed 1 out of 1: At approximately 11:50 am, LRT met with patient in craft room. LRT educated and provided patient with handouts on stress management techniques. Patient verbalized understanding. LRT encouraged patient to read over and practice the stress management techniques.  Leonette Monarch, LRT/CTRS 09.09.16 2:24 pm

## 2015-06-01 NOTE — Discharge Summary (Signed)
Physician Discharge Summary Note  Patient:  Howard [name] is an 34 y.o., male MRN:  [id number] DOB:  [birth year] Patient phone:  [phone] (home)  Patient address:   [address] [place] [address],  Total Time spent with patient: 30 minutes  Date of Admission:  05/30/2015 Date of Discharge: 06/01/2015  Reason for Admission:  Suicidal ideation.  Identifying data. Howard [name] is a 34 year old male with a history of schizophrenia and alcohol dependence.  Chief complaint. "It's good to wake up clear headed."  History of present illness. Howard [name] has a long history of schizophrenia. He is in the care of [name] maintained on a combination of Zyprexa and Celexa. For the past 3 years or so the patient has been drinking. He is drinking escalated recently to daily use of the court of liquor. He drinks during the day and consumes all his alcohol between 6:00 and 10:00 PM. He became increasingly paranoid and hallucinating in spite of good treatment compliance. He believed that there was a ghost in the house that frighten him. He no longer believes this was a ghost. He has been off alcohol for 3 days now and his thinking is clear now. He denies any symptoms of depression, anxiety, or psychosis. Denies symptoms suggestive of bipolar mania. He denies other than alcohol substance use. He was positive for benzodiazepine on admission. He admits that he uses one of his mother's benzos when he suffered shoulder pain.   Past psychiatric history. History of schizophrenia. He was tried on different medications but believes that current combination of Zyprexa and Celexa will best for him. There was suicide attempt in the past. He was hospitalized several times for worsening of psychosis and drinking. He believes that now is the time for him to stop. He is interested in substance abuse treatment.   Family psychiatric history. Nonreported.  Social history. He is disabled from mental illness. He lives with  his brother. His mother who lives nearby is very involved. He was diagnosed with prediabetes. His hemoglobin A1c is not elevated. He is interested in starting metformin to prevent diabetes as long as it is covered by his insurance.  Principal Problem: Paranoid schizophrenia Discharge Diagnoses: Patient Active Problem List   Diagnosis Date Noted  . Alcohol use disorder, moderate, dependence [F10.20] 05/31/2015  . Hypertension [I10] 05/29/2015  . Paranoid schizophrenia [F20.0] 03/20/2015    Musculoskeletal: Strength & Muscle Tone: within normal limits Gait & Station: normal Patient leans: N/A  Psychiatric Specialty Exam: Physical Exam  Nursing note and vitals reviewed.   Review of Systems  All other systems reviewed and are negative.   Blood pressure 136/82, pulse 108, temperature 98.7 F (37.1 C), temperature source Oral, resp. rate 20, height 6\' 3"  (1.905 m), weight 151.955 kg (335 lb), SpO2 94 %.Body mass index is 41.87 kg/(m^2).  See SRA.                                                  Sleep:  Number of Hours: 6.25   Have you used any form of tobacco in the last 30 days? (Cigarettes, Smokeless Tobacco, Cigars, and/or Pipes): No  Has this patient used any form of tobacco in the last 30 days? (Cigarettes, Smokeless Tobacco, Cigars, and/or Pipes) No  Past Medical History:  Past Medical History  Diagnosis Date  . Hypertension   .  Schizophrenia   . Hyperlipemia    History reviewed. No pertinent past surgical history. Family History:  Family History  Problem Relation Age of Onset  . Heart disease Mother   . Hypertension Mother   . Hyperlipidemia Mother   . Stroke Father   . Heart attack Father   . Hypertension Father   . Heart disease Brother    Social History:  History  Alcohol Use  . 3.0 oz/week  . 5 Shots of liquor, 0 Standard drinks or equivalent per week     History  Drug Use No    Social History   Social History  . Marital  Status: Single    Spouse Name: N/A  . Number of Children: N/A  . Years of Education: N/A   Social History Main Topics  . Smoking status: Never Smoker   . Smokeless tobacco: Never Used  . Alcohol Use: 3.0 oz/week    5 Shots of liquor, 0 Standard drinks or equivalent per week  . Drug Use: No  . Sexual Activity: No   Other Topics Concern  . None   Social History Narrative    Past Psychiatric History: Hospitalizations:  Outpatient Care:  Substance Abuse Care:  Self-Mutilation:  Suicidal Attempts:  Violent Behaviors:   Risk to Self: Is patient at risk for suicide?: No Risk to Others:   Prior Inpatient Therapy:   Prior Outpatient Therapy:    Level of Care:  OP  Hospital Course:    Howard [name] has a history of schizophrenia and alcohol abuse admitted for worsening of psychosis and alcohol detox.  1. Suicidal ideation. This has resolved. The patient is able to contract for safety.   2. Mood and psychosis. We continued Zyprexa and Celexa as in the community.   3. Alcohol detox. He completed Librium taper. This was uncomplicated detox. Vital signs were stable.   4. Hypertension. We continued metoprolol and maxide.   5. Dyslipidemia. We continued simvastatin.  6. GERD. We continued Protonix.  7. Substance abuse treatment. The patient is not interested in residential treatment.  8. Prediabetes. HgbA1C is normal. The patient is on Zyprexa. He agreed to start metformin to prevent metabolic syndrome.   9. Discharge planning. He was discharged to home with his brother. He will follow up with [name].   Consults:  None  Significant Diagnostic Studies:  None  Discharge Vitals:   Blood pressure 136/82, pulse 108, temperature 98.7 F (37.1 C), temperature source Oral, resp. rate 20, height 6\' 3"  (1.905 m), weight 151.955 kg (335 lb), SpO2 94 %. Body mass index is 41.87 kg/(m^2). Lab Results:   Results for orders placed or performed during the hospital encounter of  05/30/15 (from the past 72 hour(s))  Hemoglobin A1c     Status: Abnormal   Collection Time: 05/30/15  7:50 PM  Result Value Ref Range   Hgb A1c MFr Bld 6.1 (H) 4.0 - 6.0 %  Lipid panel, fasting     Status: Abnormal   Collection Time: 05/30/15  7:50 PM  Result Value Ref Range   Cholesterol 208 (H) 0 - 200 mg/dL   Triglycerides 161 <096 mg/dL   HDL 19 (L) >04 mg/dL   Total CHOL/HDL Ratio 10.9 RATIO   VLDL 25 0 - 40 mg/dL   LDL Cholesterol 540 (H) 0 - 99 mg/dL    Comment:        Total Cholesterol/HDL:CHD Risk Coronary Heart Disease Risk Table  Men   Women  1/2 Average Risk   3.4   3.3  Average Risk       5.0   4.4  2 X Average Risk   9.6   7.1  3 X Average Risk  23.4   11.0        Use the calculated Patient Ratio above and the CHD Risk Table to determine the patient's CHD Risk.        ATP III CLASSIFICATION (LDL):  <100     mg/dL   Optimal  161-096  mg/dL   Near or Above                    Optimal  130-159  mg/dL   Borderline  045-409  mg/dL   High  >811     mg/dL   Very High   TSH     Status: None   Collection Time: 05/30/15  7:50 PM  Result Value Ref Range   TSH 2.439 0.350 - 4.500 uIU/mL  Glucose, capillary     Status: Abnormal   Collection Time: 05/31/15  5:55 PM  Result Value Ref Range   Glucose-Capillary 117 (H) 65 - 99 mg/dL    Physical Findings: AIMS: Facial and Oral Movements Muscles of Facial Expression: None, normal Lips and Perioral Area: None, normal Jaw: None, normal Tongue: None, normal,Extremity Movements Upper (arms, wrists, hands, fingers): None, normal Lower (legs, knees, ankles, toes): None, normal, Trunk Movements Neck, shoulders, hips: None, normal, Overall Severity Severity of abnormal movements (highest score from questions above): None, normal Incapacitation due to abnormal movements: None, normal Patient's awareness of abnormal movements (rate only patient's report): No Awareness, Dental Status Current problems with  teeth and/or dentures?: No Does patient usually wear dentures?: No  CIWA:  CIWA-Ar Total: 0 COWS:      See Psychiatric Specialty Exam and Suicide Risk Assessment completed by Attending Physician prior to discharge.  Discharge destination:  Home  Is patient on multiple antipsychotic therapies at discharge:  No   Has Patient had three or more failed trials of antipsychotic monotherapy by history:  No    Recommended Plan for Multiple Antipsychotic Therapies: NA  Discharge Instructions    Diet - low sodium heart healthy    Complete by:  As directed      Increase activity slowly    Complete by:  As directed             Medication List    TAKE these medications      Indication   albuterol 108 (90 BASE) MCG/ACT inhaler  Commonly known as:  PROVENTIL HFA;VENTOLIN HFA  Inhale into the lungs every 6 (six) hours as needed for wheezing or shortness of breath.      citalopram 20 MG tablet  Commonly known as:  CELEXA  Take 1 tablet (20 mg total) by mouth daily.      clobetasol cream 0.05 %  Commonly known as:  TEMOVATE  Apply 1 application topically daily. Apply thinly to affected psoriatic regions daily.      DiphenhydrAMINE HCl (Sleep) 25 MG Caps  Take 50 mg by mouth at bedtime.      LATUDA 60 MG Tabs  Generic drug:  Lurasidone HCl  Take 2 tablets by mouth daily.      losartan-hydrochlorothiazide 100-25 MG per tablet  Commonly known as:  HYZAAR  Take 1 tablet by mouth daily.      metFORMIN 500 MG tablet  Commonly known as:  GLUCOPHAGE  Take 1 tablet (500 mg total) by mouth 2 (two) times daily with a meal.   Indication:  Antipsychotic Therapy-Induced Weight Gain     metoprolol succinate 100 MG 24 hr tablet  Commonly known as:  TOPROL-XL  Take 100 mg by mouth daily. Take with or immediately following a meal.      OLANZapine 15 MG tablet  Commonly known as:  ZYPREXA  TAKE 1 TABLET BY MOUTH AT BEDTIME      omeprazole 40 MG capsule  Commonly known as:  PRILOSEC   Take 40 mg by mouth every morning.      simvastatin 40 MG tablet  Commonly known as:  ZOCOR  Take 40 mg by mouth daily.          Follow-up recommendations:  Activity:  As tolerated. Diet:  Low sodium heart healthy. Other:  Keep follow-up appointments.  Comments:    Total Discharge Time: 35 min.  Signed: Kristine Linea 06/01/2015, 10:52 AM

## 2015-06-01 NOTE — Progress Notes (Signed)
D: pt aware of discharge this shift, pt denies suicidal ideation or homicidal ideation, pt calm and cooperative, no distress noted  A: all personal items in locker returned to patient, instructions given on discharge information, received prescriptions and follow up appointment  R: patient states he will comply with outpatient services and medications as prescribed.  Patient left with mother.

## 2015-06-01 NOTE — Progress Notes (Signed)
D: Pt denies SI/HI, patient does not appear paranoid or suspicious of peers and staff.Patient  is pleasant and cooperative, affect is flat but brightens approach , he appears less anxious and he is interacting with peers and staff appropriately.  A: Pt was offered support and encouragement. Pt was given scheduled medications. Pt was encouraged to attend groups. Q 15 minute checks were done for safety.  R: Pt attends groups and interacts well with peers and staff. Pt is taking medication. Pt has no complaints.Pt receptive to treatment and safety maintained on unit.

## 2015-06-01 NOTE — Plan of Care (Signed)
Problem: Ineffective individual coping Goal: LTG: Patient will report a decrease in negative feelings Outcome: Progressing Patient states that his depression is better this shift and he is having less withdrawal symptoms and seems much more pleasant this shift.

## 2015-06-01 NOTE — Progress Notes (Signed)
  [facility] Case Management Discharge Plan :  Will you be returning to the same living situation after discharge:  Yes,  home with his brother At discharge, do you have transportation home?: Yes,  mother to pick up and patient can drive to his appts Do you have the ability to pay for your medications: Yes,  patient has Medicaid and medicare  Release of information consent forms completed and in the chart;  Patient's signature needed at discharge.  Patient to Follow up at: Follow-up Information    Follow up with [facility]. Go on 06/05/2015.   Why:  For follow-up care appt for SAOP Tuesday 06/05/15 at 9am (walk in hours M-F 9am-4pm)   Contact information:   [address] [place], [place] [phone] Fax [phone]      Follow up with [facility]-[name]. Go on 06/04/2015.   Why:  For follow-up care appt Monday 06/04/15 at 2:20pm    Contact information:   [address] [place], [place] [address] Ph [phone] Fax [phone]       Patient denies SI/HI: Yes,  patient denies SI/HI    Safety Planning and Suicide Prevention discussed: Yes,  SPE discussed with patient and his mother [name] [phone]  Have you used any form of tobacco in the last 30 days? (Cigarettes, Smokeless Tobacco, Cigars, and/or Pipes): No  Has patient been referred to the Quitline?: N/A patient is not a smoker  Lulu Riding, MSW, LCSWA 06/01/2015, 11:48 AM

## 2015-06-01 NOTE — [facility] Suicide Risk Assessment (Signed)
[facility] INPATIENT:  Family/Significant Other Suicide Prevention Education  Suicide Prevention Education:  Education Completed; [name] (mother) [phone] has been identified by the patient as the family member/significant other with whom the patient will be residing, and identified as the person(s) who will aid the patient in the event of a mental health crisis (suicidal ideations/suicide attempt).  With written consent from the patient, the family member/significant other has been provided the following suicide prevention education, prior to the and/or following the discharge of the patient.  The suicide prevention education provided includes the following:  Suicide risk factors  Suicide prevention and interventions  National Suicide Hotline telephone number  [facility] assessment telephone number  [facility] Emergency Assistance 911  [facility] and/or Residential Mobile Crisis Unit telephone number  Request made of family/significant other to:  Remove weapons (e.g., guns, rifles, knives), all items previously/currently identified as safety concern.    Remove drugs/medications (over-the-counter, prescriptions, illicit drugs), all items previously/currently identified as a safety concern.  The family member/significant other verbalizes understanding of the suicide prevention education information provided.  The family member/significant other agrees to remove the items of safety concern listed above.  Lulu Riding, MSW, LCSWA 06/01/2015, 11:43 AM

## 2015-06-01 NOTE — Progress Notes (Signed)
Recreation Therapy Notes  INPATIENT RECREATION TR PLAN  Patient Details Name: Howard [name] MRN: [id number] DOB: 12/30/1980 Today's Date: 06/01/2015  Rec Therapy Plan Is patient appropriate for Therapeutic Recreation?: Yes Treatment times per week: At least once a week TR Treatment/Interventions: 1:1 session, Group participation (Comment) (Appropriate participation in daily recreation therapy tx)  Discharge Criteria Pt will be discharged from therapy if:: Discharged Treatment plan/goals/alternatives discussed and agreed upon by:: Patient/family  Discharge Summary Short term goals set: See Care Plan Short term goals met: Complete Progress toward goals comments: One-to-one attended Which groups?: Leisure education, Self-esteem One-to-one attended: Stress managment, coping skills Reason goals not met: N/A Therapeutic equipment acquired: None Reason patient discharged from therapy: Discharge from hospital Pt/family agrees with progress & goals achieved: Yes Date patient discharged from therapy: 06/01/15   Leonette Monarch, LRT/CTRS 06/01/2015, 2:25 PM

## 2015-06-01 NOTE — [facility] Suicide Risk Assessment (Signed)
[facility] Discharge Suicide Risk Assessment   Demographic Factors:  Male and Caucasian  Total Time spent with patient: 30 minutes  Musculoskeletal: Strength & Muscle Tone: within normal limits Gait & Station: normal Patient leans: N/A  Psychiatric Specialty Exam: Physical Exam  Nursing note and vitals reviewed.   Review of Systems  All other systems reviewed and are negative.   Blood pressure 136/82, pulse 108, temperature 98.7 F (37.1 C), temperature source Oral, resp. rate 20, height  (1.905 m), weight 151.955 kg (335 lb), SpO2 94 %.Body mass index is 41.87 kg/(m^2).  General Appearance: Casual  Eye Contact::  Good  Speech:  Clear and Coherent409  Volume:  Normal  Mood:  Euthymic  Affect:  Appropriate  Thought Process:  Goal Directed  Orientation:  Full (Time, Place, and Person)  Thought Content:  WDL  Suicidal Thoughts:  No  Homicidal Thoughts:  No  Memory:  Immediate;   Fair Recent;   Fair Remote;   Fair  Judgement:  Fair  Insight:  Fair  Psychomotor Activity:  Normal  Concentration:  Fair  Recall:  [employer] of Knowledge:Fair  Language: Fair  Akathisia:  No  Handed:  Right  AIMS (if indicated):     Assets:  Communication Skills Desire for Improvement Financial Resources/Insurance Housing Physical Health Social Support  Sleep:  Number of Hours: 6.25  Cognition: WNL  ADL's:  Intact   Have you used any form of tobacco in the last 30 days? (Cigarettes, Smokeless Tobacco, Cigars, and/or Pipes): No  Has this patient used any form of tobacco in the last 30 days? (Cigarettes, Smokeless Tobacco, Cigars, and/or Pipes) No  Mental Status Per Nursing Assessment::   On Admission:     Current Mental Status by Physician: NA  Loss Factors: NA  Historical Factors: Prior suicide attempts and Impulsivity  Risk Reduction Factors:   Sense of responsibility to family, Living with another person, especially a relative, Positive social support and Positive  therapeutic relationship  Continued Clinical Symptoms:  Alcohol/Substance Abuse/Dependencies Schizophrenia:   Depressive state Less than 66 years old Paranoid or undifferentiated type  Cognitive Features That Contribute To Risk:  None    Suicide Risk:  Minimal: No identifiable suicidal ideation.  Patients presenting with no risk factors but with morbid ruminations; may be classified as minimal risk based on the severity of the depressive symptoms  Principal Problem: Paranoid schizophrenia Discharge Diagnoses:  Patient Active Problem List   Diagnosis Date Noted  . Alcohol use disorder, moderate, dependence [F10.20] 05/31/2015  . Hypertension [I10] 05/29/2015  . Paranoid schizophrenia [F20.0] 03/20/2015      Plan Of Care/Follow-up recommendations:  Activity:  As tolerated..tolerated Diet:  low sodium heart healthy. Other:  keep follow up appointments.  Is patient on multiple antipsychotic therapies at discharge:  No   Has Patient had three or more failed trials of antipsychotic monotherapy by history:  No  Recommended Plan for Multiple Antipsychotic Therapies: NA    Howard [name] 06/01/2015, 10:48 AM

## 2015-06-01 NOTE — Plan of Care (Signed)
Problem: Alteration in mood & ability to function due to Goal: LTG-Pt reports reduction in suicidal thoughts (Patient reports reduction in suicidal thoughts and is able to verbalize a safety plan for whenever patient is feeling suicidal)  Outcome: Progressing Patient denies SI/HI, and contracts for safety.   Problem: Alteration in mood Goal: LTG-Pt's behavior demonstrates decreased signs of depression (Patient's behavior demonstrates decreased signs of depression to the point the patient is safe to return home and continue treatment in an outpatient setting)  Outcome: Progressing Patient is interacting with peers and staff;  going to dayroom and socializing.

## 2015-06-02 ENCOUNTER — Other Ambulatory Visit: Payer: Self-pay | Admitting: Psychiatry

## 2015-06-04 ENCOUNTER — Ambulatory Visit (INDEPENDENT_AMBULATORY_CARE_PROVIDER_SITE_OTHER): Payer: Medicare Other | Admitting: Psychiatry

## 2015-06-04 ENCOUNTER — Encounter: Payer: Self-pay | Admitting: Psychiatry

## 2015-06-04 VITALS — BP 142/88 | HR 94 | Temp 99.1°F | Ht 75.0 in | Wt 325.0 lb

## 2015-06-04 DIAGNOSIS — F1010 Alcohol abuse, uncomplicated: Secondary | ICD-10-CM

## 2015-06-04 DIAGNOSIS — E663 Overweight: Secondary | ICD-10-CM

## 2015-06-04 DIAGNOSIS — F200 Paranoid schizophrenia: Secondary | ICD-10-CM | POA: Diagnosis not present

## 2015-06-04 NOTE — Progress Notes (Signed)
[facility] MD Progress Note  06/04/2015 3:03 PM Howard [name]  MRN:  [id number] Subjective:  Just got out of the hospital late last week. He reports that he is feeling guilty because he relapsed. He had a small amount of vodka to drink over the weekend. He bought a bottle and drank what sounds like several shots but then threw the rest of it away. Mood is otherwise feeling about the same as it was before he went into the hospital. He still believes that there are ghosts in his house but says that he is trying to adapt to it because he knows they are not hurting him. He has discussed it with his family and they seem to be reassuring to him. He is still taking the Zyprexa 15 mg at night. He was started on metformin in the hospital because of being overweight and hyperglycemia along with Zyprexa. I explained the utility of this to him. We also spent quite a bit of time discussing his alcohol use and trying to do some cognitive counseling around it Principal Problem: @PPROB @ Diagnosis:   Patient Active Problem List   Diagnosis Date Noted  . Alcohol use disorder, moderate, dependence [F10.20] 05/31/2015  . Hypertension [I10] 05/29/2015  . Paranoid schizophrenia [F20.0] 03/20/2015   Total Time spent with patient: 30 minutes   Past Medical History:  Past Medical History  Diagnosis Date  . Hypertension   . Schizophrenia   . Hyperlipemia   . Depression    History reviewed. No pertinent past surgical history. Family History:  Family History  Problem Relation Age of Onset  . Heart disease Mother   . Hypertension Mother   . Hyperlipidemia Mother   . Stroke Father   . Heart attack Father   . Hypertension Father   . Heart disease Brother    Social History:  History  Alcohol Use  . 3.0 oz/week  . 5 Shots of liquor, 0 Standard drinks or equivalent per week     History  Drug Use No    Social History   Social History  . Marital Status: Single    Spouse Name: N/A  . Number of Children: N/A  .  Years of Education: N/A   Social History Main Topics  . Smoking status: Never Smoker   . Smokeless tobacco: Never Used  . Alcohol Use: 3.0 oz/week    5 Shots of liquor, 0 Standard drinks or equivalent per week  . Drug Use: No  . Sexual Activity: No   Other Topics Concern  . None   Social History Narrative   Additional History:    Sleep: Good  Appetite:  Good   Assessment: schizophrenia he is stable not getting worse. I'm hesitant to try adding more anti-psychotics especially if he is still having the stress of drinking. Continue Zyprexa 15 at night. We did a lot of counseling and support around his drinking. He is supposed to start outpatient group therapy at [facility] this Thursday. Strongly encourage him to follow-up with that. I don't think that he needs to be back in the hospital right now.  Musculoskeletal: Strength & Muscle Tone: within normal limits Gait & Station: normal Patient leans: N/A   Psychiatric Specialty Exam: Physical Exam  ROS  Blood pressure 142/88, pulse 94, temperature 99.1 F (37.3 C), temperature source Tympanic, height 6\' 3"  (1.905 m), weight 325 lb (147.419 kg), SpO2 89 %.Body mass index is 40.62 kg/(m^2).  General Appearance: Casual  Eye Contact::  Good  Speech:  Slow  Volume:  Decreased  Mood:  Anxious  Affect:  Constricted  Thought Process:  Goal Directed  Orientation:  Full (Time, Place, and Person)  Thought Content:  Hallucinations: Visual  Suicidal Thoughts:  No  Homicidal Thoughts:  No  Memory:  Immediate;   Good Recent;   Good Remote;   Good  Judgement:  Fair  Insight:  Fair  Psychomotor Activity:  Normal  Concentration:  Fair  Recall:  [employer] of Knowledge:Fair  Language: Fair  Akathisia:  No  Handed:  Right  AIMS (if indicated):     Assets:  Communication Skills Desire for Improvement Housing Resilience Social Support  ADL's:  Intact  Cognition: WNL  Sleep:        Current Medications: Current Outpatient  Prescriptions  Medication Sig Dispense Refill  . albuterol (PROVENTIL HFA;VENTOLIN HFA) 108 (90 BASE) MCG/ACT inhaler Inhale into the lungs every 6 (six) hours as needed for wheezing or shortness of breath.    . citalopram (CELEXA) 20 MG tablet Take 1 tablet (20 mg total) by mouth daily. 30 tablet 5  . clobetasol cream (TEMOVATE) 0.05 % Apply 1 application topically daily. Apply thinly to affected psoriatic regions daily.    . DiphenhydrAMINE HCl, Sleep, 25 MG CAPS Take 50 mg by mouth at bedtime. 60 capsule 2  . losartan-hydrochlorothiazide (HYZAAR) 100-25 MG per tablet Take 1 tablet by mouth daily.    . Lurasidone HCl (LATUDA) 60 MG TABS Take 2 tablets by mouth daily.    . metFORMIN (GLUCOPHAGE) 500 MG tablet Take 1 tablet (500 mg total) by mouth 2 (two) times daily with a meal. 60 tablet 3  . metoprolol succinate (TOPROL-XL) 100 MG 24 hr tablet Take 100 mg by mouth daily. Take with or immediately following a meal.    . OLANZapine (ZYPREXA) 15 MG tablet TAKE 1 TABLET BY MOUTH AT BEDTIME 30 tablet 3  . omeprazole (PRILOSEC) 40 MG capsule Take 40 mg by mouth every morning.    . simvastatin (ZOCOR) 40 MG tablet Take 40 mg by mouth daily.     No current facility-administered medications for this visit.    Lab Results: No results found for this or any previous visit (from the past 48 hour(s)).  Physical Findings: AIMS:  , ,  ,  ,    CIWA:    COWS:     Treatment Plan Summary: Medication management and Plan continue the Zyprexa and citalopram as well as the metformin. No new prescriptions needed. I would like to see him back in about a month. Did some cognitive therapy and supportive counseling around his drinking and trying to normalize his experiences with his psychosis. We will follow-up in about a month and he will go to outpatient treatment meanwhile. He also was asking about going to see a therapist here in our office. I strongly encouraged him to follow-up with that. I think anything that  gets him out of his boring routine during the day would help him to avoid the temptation to go back to drinking.   Medical Decision Making:  Review of Psycho-Social Stressors (1), Established Problem, Worsening (2) and Review of Medication Regimen & Side Effects (2)     John [name] 06/04/2015, 3:03 PM

## 2015-06-08 ENCOUNTER — Ambulatory Visit (INDEPENDENT_AMBULATORY_CARE_PROVIDER_SITE_OTHER): Payer: Medicare Other | Admitting: Licensed Clinical Social Worker

## 2015-06-08 DIAGNOSIS — F200 Paranoid schizophrenia: Secondary | ICD-10-CM | POA: Diagnosis not present
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
## 2015-06-22 ENCOUNTER — Ambulatory Visit: Payer: Self-pay | Admitting: Licensed Clinical Social Worker

## 2015-06-25 ENCOUNTER — Ambulatory Visit: Payer: Medicare Other | Admitting: Licensed Clinical Social Worker

## 2015-06-27 ENCOUNTER — Ambulatory Visit (INDEPENDENT_AMBULATORY_CARE_PROVIDER_SITE_OTHER): Payer: Medicare Other | Admitting: Licensed Clinical Social Worker

## 2015-06-27 DIAGNOSIS — F1010 Alcohol abuse, uncomplicated: Secondary | ICD-10-CM | POA: Diagnosis not present

## 2015-06-27 DIAGNOSIS — F200 Paranoid schizophrenia: Secondary | ICD-10-CM | POA: Diagnosis not present

## 2015-06-28 NOTE — Progress Notes (Signed)
   THERAPIST PROGRESS NOTE  Session Time:  Participation Level: Active  Behavioral Response: CasualAlertDepressed  Type of Therapy: Individual Therapy  Treatment Goals addressed: Coping  Interventions: CBT, Motivational Interviewing, Supportive and Reframing  Summary: Howard [name] is a 34 y.o. male who presents with continued symptoms of his diagnoses.  [name] continues to use alcohol to cope.  [name] was open to learning additional coping skills such as singing and interacting with his brother.  [name] is gaining weight from medication per Patient.  He states that he has gained about 70 pounds.  He denies exercising and refuses.  During session he role played with Therapist.  Suicidal/Homicidal: Nowithout intent/plan  Therapist Response: Therapist explored with Howard [name] coping skills and the importance of him being sober.  Therapist educated him on the effects of alcohol.  Therapist role played coping skills with Patient  Plan: Return again in 2 weeks.  Diagnosis: Axis I: Paranoid Schizophrenia    Axis II: No diagnosis    Marinda Elk 06/28/2015

## 2015-07-02 NOTE — Progress Notes (Signed)
[facility] MD Progress Note  07/02/2015 5:11 PM Howard [name]  MRN:  [id number] Subjective:  Follow-up for this 34 year old man with schizophrenia and alcohol abuse. Recent hospitalization. Mood is currently feeling better. Denies suicidal ideation. Tolerating medicine well. Fewer symptoms of psychosis. Still drinking a little bit but trying to keep it under control. Principal Problem: @ Diagnosis:   Patient Active Problem List   Diagnosis Date Noted  . Alcohol use disorder, moderate, dependence (HCC) [F10.20] 05/31/2015  . Hypertension [I10] 05/29/2015  . Paranoid schizophrenia (HCC) [F20.0] 03/20/2015   Total Time spent with patient: 20 minutes  Past Psychiatric History: past history of psychiatric treatment including hospitalization for schizophrenia. More recent history of substance abuse.  Past Medical History:  Past Medical History  Diagnosis Date  . Hypertension   . Schizophrenia   . Hyperlipemia   . Depression    No past surgical history on file. Family History:  Family History  Problem Relation Age of Onset  . Heart disease Mother   . Hypertension Mother   . Hyperlipidemia Mother   . Stroke Father   . Heart attack Father   . Hypertension Father   . Heart disease Brother    Family Psychiatric  History: positive family history of psychotic disorder Social History:  History  Alcohol Use  . 3.0 oz/week  . 5 Shots of liquor, 0 Standard drinks or equivalent per week     History  Drug Use No    Social History   Social History  . Marital Status: Single    Spouse Name: N/A  . Number of Children: N/A  . Years of Education: N/A   Social History Main Topics  . Smoking status: Never Smoker   . Smokeless tobacco: Never Used  . Alcohol Use: 3.0 oz/week    5 Shots of liquor, 0 Standard drinks or equivalent per week  . Drug Use: No  . Sexual Activity: No   Other Topics Concern  . Not on file   Social History Narrative   Additional Social History:                          Sleep: Good  Appetite:  Good  Current Medications: Current Outpatient Prescriptions  Medication Sig Dispense Refill  . albuterol (PROVENTIL HFA;VENTOLIN HFA) 108 (90 BASE) MCG/ACT inhaler Inhale into the lungs every 6 (six) hours as needed for wheezing or shortness of breath.    . citalopram (CELEXA) 20 MG tablet Take 1 tablet (20 mg total) by mouth daily. 30 tablet 5  . citalopram (CELEXA) 20 MG tablet TAKE 1 TABLET BY MOUTH EVERY DAY 30 tablet 1  . clobetasol cream (TEMOVATE) 0.05 % Apply 1 application topically daily. Apply thinly to affected psoriatic regions daily.    . [employer] ALLERGY 25 MG tablet TAKE 2 TABLETS BY MOUTH AT BEDTIME 60 tablet 3  . DiphenhydrAMINE HCl, Sleep, 25 MG CAPS Take 50 mg by mouth at bedtime. 60 capsule 2  . losartan-hydrochlorothiazide (HYZAAR) 100-25 MG per tablet Take 1 tablet by mouth daily.    . Lurasidone HCl (LATUDA) 60 MG TABS Take 2 tablets by mouth daily.    . metFORMIN (GLUCOPHAGE) 500 MG tablet Take 1 tablet (500 mg total) by mouth 2 (two) times daily with a meal. 60 tablet 3  . metoprolol succinate (TOPROL-XL) 100 MG 24 hr tablet Take 100 mg by mouth daily. Take with or immediately following a meal.    . OLANZapine (ZYPREXA)  15 MG tablet TAKE 1 TABLET BY MOUTH AT BEDTIME 30 tablet 3  . omeprazole (PRILOSEC) 40 MG capsule Take 40 mg by mouth every morning.    . simvastatin (ZOCOR) 40 MG tablet Take 40 mg by mouth daily.     No current facility-administered medications for this visit.    Lab Results: No results found for this or any previous visit (from the past 48 hour(s)).  Physical Findings: AIMS:  , ,  ,  ,    CIWA:    COWS:     Musculoskeletal: Strength & Muscle Tone: within normal limits Gait & Station: normal Patient leans: N/A  Psychiatric Specialty Exam: ROS  There were no vitals taken for this visit.There is no weight on file to calculate BMI.  General Appearance: Casual  Eye Contact::  Fair  Speech:   Clear and Coherent  Volume:  Normal  Mood:  Euthymic  Affect:  Congruent  Thought Process:  Coherent  Orientation:  Full (Time, Place, and Person)  Thought Content:  Hallucinations: Auditory and Paranoid Ideation  Suicidal Thoughts:  No  Homicidal Thoughts:  No  Memory:  Immediate;   Good Recent;   Fair Remote;   Fair  Judgement:  Fair  Insight:  Fair  Psychomotor Activity:  Decreased  Concentration:  Fair  Recall:  [employer] of Knowledge:Fair  Language: Fair  Akathisia:  No  Handed:  Right  AIMS (if indicated):     Assets:  Communication Skills Desire for Improvement Financial Resources/Insurance Housing Physical Health  ADL's:  Intact  Cognition: WNL  Sleep:      Treatment Plan Summary: Medication management and Plan patient will continue on current dose of olanzapine. Side effects reviewed. Symptoms of psychosis reviewed. Tried to continue to get him to work on agreeing to go regularly to substance abuse treatment. Follow-up in another 2 months.  Howard [name] 07/02/2015, 5:11 PM

## 2015-07-03 ENCOUNTER — Ambulatory Visit: Payer: Medicare Other | Admitting: Psychiatry

## 2015-07-04 ENCOUNTER — Encounter: Payer: Self-pay | Admitting: Internal Medicine

## 2015-07-04 ENCOUNTER — Inpatient Hospital Stay
Admission: EM | Admit: 2015-07-04 | Discharge: 2015-07-08 | DRG: 189 | Disposition: A | Discharge status: Home / Self Care | Payer: Medicare Other | Attending: Internal Medicine | Admitting: Internal Medicine

## 2015-07-04 ENCOUNTER — Emergency Department: Payer: Medicare Other

## 2015-07-04 DIAGNOSIS — F1020 Alcohol dependence, uncomplicated: Secondary | ICD-10-CM | POA: Diagnosis present

## 2015-07-04 DIAGNOSIS — E119 Type 2 diabetes mellitus without complications: Secondary | ICD-10-CM | POA: Diagnosis present

## 2015-07-04 DIAGNOSIS — R188 Other ascites: Secondary | ICD-10-CM

## 2015-07-04 DIAGNOSIS — R740 Nonspecific elevation of levels of transaminase and lactic acid dehydrogenase [LDH]: Secondary | ICD-10-CM | POA: Diagnosis present

## 2015-07-04 DIAGNOSIS — Z6841 Body Mass Index (BMI) 40.0 and over, adult: Secondary | ICD-10-CM

## 2015-07-04 DIAGNOSIS — F200 Paranoid schizophrenia: Secondary | ICD-10-CM | POA: Diagnosis present

## 2015-07-04 DIAGNOSIS — Z8249 Family history of ischemic heart disease and other diseases of the circulatory system: Secondary | ICD-10-CM

## 2015-07-04 DIAGNOSIS — E785 Hyperlipidemia, unspecified: Secondary | ICD-10-CM | POA: Diagnosis present

## 2015-07-04 DIAGNOSIS — R7401 Elevation of levels of liver transaminase levels: Secondary | ICD-10-CM | POA: Diagnosis present

## 2015-07-04 DIAGNOSIS — F1011 Alcohol abuse, in remission: Secondary | ICD-10-CM

## 2015-07-04 DIAGNOSIS — J90 Pleural effusion, not elsewhere classified: Secondary | ICD-10-CM | POA: Diagnosis present

## 2015-07-04 DIAGNOSIS — K7031 Alcoholic cirrhosis of liver with ascites: Secondary | ICD-10-CM | POA: Diagnosis present

## 2015-07-04 DIAGNOSIS — F1010 Alcohol abuse, uncomplicated: Secondary | ICD-10-CM | POA: Diagnosis present

## 2015-07-04 DIAGNOSIS — F259 Schizoaffective disorder, unspecified: Secondary | ICD-10-CM | POA: Diagnosis present

## 2015-07-04 DIAGNOSIS — E669 Obesity, unspecified: Secondary | ICD-10-CM | POA: Diagnosis present

## 2015-07-04 DIAGNOSIS — K760 Fatty (change of) liver, not elsewhere classified: Secondary | ICD-10-CM | POA: Diagnosis present

## 2015-07-04 DIAGNOSIS — F329 Major depressive disorder, single episode, unspecified: Secondary | ICD-10-CM | POA: Diagnosis present

## 2015-07-04 DIAGNOSIS — J9601 Acute respiratory failure with hypoxia: Principal | ICD-10-CM | POA: Diagnosis present

## 2015-07-04 DIAGNOSIS — R040 Epistaxis: Secondary | ICD-10-CM | POA: Diagnosis present

## 2015-07-04 DIAGNOSIS — D72829 Elevated white blood cell count, unspecified: Secondary | ICD-10-CM | POA: Diagnosis present

## 2015-07-04 DIAGNOSIS — R1900 Intra-abdominal and pelvic swelling, mass and lump, unspecified site: Secondary | ICD-10-CM | POA: Diagnosis present

## 2015-07-04 DIAGNOSIS — D696 Thrombocytopenia, unspecified: Secondary | ICD-10-CM | POA: Diagnosis present

## 2015-07-04 DIAGNOSIS — R0682 Tachypnea, not elsewhere classified: Secondary | ICD-10-CM | POA: Diagnosis present

## 2015-07-04 DIAGNOSIS — R1084 Generalized abdominal pain: Secondary | ICD-10-CM | POA: Diagnosis not present

## 2015-07-04 DIAGNOSIS — R161 Splenomegaly, not elsewhere classified: Secondary | ICD-10-CM | POA: Diagnosis present

## 2015-07-04 DIAGNOSIS — K766 Portal hypertension: Secondary | ICD-10-CM | POA: Diagnosis present

## 2015-07-04 DIAGNOSIS — K7460 Unspecified cirrhosis of liver: Secondary | ICD-10-CM

## 2015-07-04 DIAGNOSIS — I10 Essential (primary) hypertension: Secondary | ICD-10-CM | POA: Diagnosis present

## 2015-07-04 DIAGNOSIS — Z823 Family history of stroke: Secondary | ICD-10-CM

## 2015-07-04 DIAGNOSIS — R0902 Hypoxemia: Secondary | ICD-10-CM

## 2015-07-04 DIAGNOSIS — Z79899 Other long term (current) drug therapy: Secondary | ICD-10-CM | POA: Diagnosis not present

## 2015-07-04 DIAGNOSIS — R0600 Dyspnea, unspecified: Secondary | ICD-10-CM | POA: Diagnosis not present

## 2015-07-04 DIAGNOSIS — F29 Unspecified psychosis not due to a substance or known physiological condition: Secondary | ICD-10-CM

## 2015-07-04 DIAGNOSIS — R45851 Suicidal ideations: Secondary | ICD-10-CM

## 2015-07-04 DIAGNOSIS — Z7982 Long term (current) use of aspirin: Secondary | ICD-10-CM

## 2015-07-04 LAB — COMPREHENSIVE METABOLIC PANEL
ALBUMIN: 3 g/dL — AB (ref 3.5–5.0)
ALK PHOS: 157 U/L — AB (ref 38–126)
ALT: 60 U/L (ref 17–63)
ANION GAP: 11 (ref 5–15)
AST: 233 U/L — ABNORMAL HIGH (ref 15–41)
BUN: 8 mg/dL (ref 6–20)
CALCIUM: 8 mg/dL — AB (ref 8.9–10.3)
CO2: 35 mmol/L — AB (ref 22–32)
Chloride: 93 mmol/L — ABNORMAL LOW (ref 101–111)
Creatinine, Ser: 0.69 mg/dL (ref 0.61–1.24)
GFR calc Af Amer: >60 mL/min (ref >60)
GFR calc non Af Amer: >60 mL/min (ref >60)
GLUCOSE: 112 mg/dL — AB (ref 65–99)
Potassium: 3.2 mmol/L — ABNORMAL LOW (ref 3.5–5.1)
SODIUM: 139 mmol/L (ref 135–145)
Total Bilirubin: 3.1 mg/dL — ABNORMAL HIGH (ref 0.3–1.2)
Total Protein: 8.3 g/dL — ABNORMAL HIGH (ref 6.5–8.1)

## 2015-07-04 LAB — CBC WITH DIFFERENTIAL/PLATELET
BASOS PCT: 1 %
Basophils Absolute: 0.1 10*3/uL (ref 0–0.1)
Eosinophils Absolute: 0.1 10*3/uL (ref 0–0.7)
Eosinophils Relative: 1 %
HEMATOCRIT: 44 % (ref 40.0–52.0)
HEMOGLOBIN: 14.2 g/dL (ref 13.0–18.0)
LYMPHS ABS: 1.4 10*3/uL (ref 1.0–3.6)
Lymphocytes Relative: 13 %
MCH: 31.2 pg (ref 26.0–34.0)
MCHC: 32.2 g/dL (ref 32.0–36.0)
MCV: 96.9 fL (ref 80.0–100.0)
MONO ABS: 1 10*3/uL (ref 0.2–1.0)
MONOS PCT: 9 %
NEUTROS ABS: 8.6 10*3/uL — AB (ref 1.4–6.5)
NEUTROS PCT: 76 %
Platelets: 113 10*3/uL — ABNORMAL LOW (ref 150–440)
RBC: 4.54 MIL/uL (ref 4.40–5.90)
RDW: 17.9 % — AB (ref 11.5–14.5)
WBC: 11.2 10*3/uL — ABNORMAL HIGH (ref 3.8–10.6)

## 2015-07-04 LAB — TROPONIN I: Troponin I: <0.03 ng/mL (ref <0.031)

## 2015-07-04 LAB — PROTEIN, BODY FLUID

## 2015-07-04 LAB — GLUCOSE, SEROUS FLUID: GLUCOSE FL: 115 mg/dL

## 2015-07-04 LAB — LACTATE DEHYDROGENASE, PLEURAL OR PERITONEAL FLUID: LD FL: 56 U/L — AB (ref 3–23)

## 2015-07-04 LAB — PROTIME-INR
INR: 1.35
Prothrombin Time: 16.9 seconds — ABNORMAL HIGH (ref 11.4–15.0)

## 2015-07-04 LAB — APTT: aPTT: 36 seconds (ref 24–36)

## 2015-07-04 LAB — ACETAMINOPHEN LEVEL: Acetaminophen (Tylenol), Serum: <10 ug/mL — ABNORMAL LOW (ref 10–30)

## 2015-07-04 LAB — SALICYLATE LEVEL: Salicylate Lvl: <4 mg/dL (ref 2.8–30.0)

## 2015-07-04 LAB — ETHANOL: ALCOHOL ETHYL (B): 170 mg/dL — AB (ref <5)

## 2015-07-04 MED ORDER — LORAZEPAM 1 MG PO TABS: 0.0000 mg | ORAL_TABLET | Freq: Two times a day (BID) | ORAL | Status: DC

## 2015-07-04 MED ORDER — LORAZEPAM 1 MG PO TABS
ORAL_TABLET | ORAL | Status: AC
  Administered 2015-07-05: 1 mg via ORAL
  Filled 2015-07-04: qty 1

## 2015-07-04 MED ORDER — VITAMIN B-1 100 MG PO TABS
100.0000 mg | ORAL_TABLET | Freq: Every day | ORAL | Status: DC
  Administered 2015-07-04: 100 mg via ORAL
  Filled 2015-07-04: qty 1

## 2015-07-04 MED ORDER — OLANZAPINE 5 MG PO TBDP
15.0000 mg | ORAL_TABLET | Freq: Every day | ORAL | Status: DC
  Administered 2015-07-04 – 2015-07-07 (×4): 15 mg via ORAL
  Filled 2015-07-04 (×2): qty 3
  Filled 2015-07-04 (×2): qty 1
  Filled 2015-07-04: qty 3
  Filled 2015-07-04 (×2): qty 1

## 2015-07-04 MED ORDER — LORAZEPAM 2 MG/ML IJ SOLN: 0.0000 mg | Freq: Two times a day (BID) | INTRAMUSCULAR | Status: DC

## 2015-07-04 MED ORDER — THIAMINE HCL 100 MG/ML IJ SOLN: 100.0000 mg | Freq: Every day | INTRAMUSCULAR | Status: DC

## 2015-07-04 MED ORDER — LORAZEPAM 1 MG PO TABS
0.0000 mg | ORAL_TABLET | Freq: Four times a day (QID) | ORAL | Status: DC
  Administered 2015-07-05: 1 mg via ORAL

## 2015-07-04 MED ORDER — DEXTROSE 5 % IV SOLN
2.0000 g | Freq: Once | INTRAVENOUS | Status: AC
  Administered 2015-07-04: 2 g via INTRAVENOUS
  Filled 2015-07-04: qty 2

## 2015-07-04 MED ORDER — LORAZEPAM 2 MG/ML IJ SOLN
0.0000 mg | Freq: Four times a day (QID) | INTRAMUSCULAR | Status: DC
Start: 2015-07-04 — End: 2015-07-05

## 2015-07-04 MED ORDER — ONDANSETRON HCL 4 MG/2ML IJ SOLN
4.0000 mg | Freq: Once | INTRAMUSCULAR | Status: AC
Start: 2015-07-04 — End: 2015-07-04
  Administered 2015-07-04: 4 mg via INTRAVENOUS
  Filled 2015-07-04: qty 2

## 2015-07-04 NOTE — ED Notes (Signed)
Pt o2 sat 87-91% on 5L Aubrey, MD notified, pt to remain on nasal cannula at this time

## 2015-07-04 NOTE — BH Assessment (Signed)
Assessment Note  Howard [name] is a 34 y.o. male presenting to the ED voluntarily, initially complaining of hearing voices telling him to kill himself.  Pt reports a history of schizophrenia and states that he is a patient of Dr. Toni Amendlapacs.  Pt reports drinking a pint of vodka everyday but states that he wants to quit drinking due to his health condition.  Pt reports worsening abdominal swelling with difficulty breathing.  Pt states he no longer wants to kill himself as he "has too much to live for".  Pt states he wants to focus on improving his health condition.  Pt denies having a SI plan.  Pt denies HI and states he is no longer hearing voices.  Pt denies any other drug use.  Diagnosis: Alcohol abuse  Past Medical History:  Past Medical History  Diagnosis Date  . Hypertension   . Schizophrenia (HCC)   . Hyperlipemia   . Depression   . Diabetes [facility](HCC)     Past Surgical History  Procedure Laterality Date  . No past surgeries      Family History:  Family History  Problem Relation Age of Onset  . Heart disease Mother   . Hypertension Mother   . Hyperlipidemia Mother   . Stroke Father   . Heart attack Father   . Hypertension Father   . Heart disease Brother     Social History:  reports that he has never smoked. He has never used smokeless tobacco. He reports that he drinks about 3.0 oz of alcohol per week. He reports that he does not use illicit drugs.  Additional Social History:  Alcohol / Drug Use History of alcohol / drug use?: Yes Longest period of sobriety (when/how long): "not sure" Substance #1 Name of Substance 1: vodka 1 - Age of First Use: 22 1 - Amount (size/oz): 1 pint 1 - Frequency: every day 1 - Duration: all day 1 - Last Use / Amount: 07/03/2015  CIWA: CIWA-Ar BP: (!) 154/88 mmHg Pulse Rate: (!) 108 Nausea and Vomiting: no nausea and no vomiting Tactile Disturbances: none Tremor: no tremor Auditory Disturbances: not present Paroxysmal Sweats: no sweat  visible Visual Disturbances: not present Anxiety: no anxiety, at ease Headache, Fullness in Head: none present Agitation: normal activity Orientation and Clouding of Sensorium: oriented and can do serial additions CIWA-Ar Total: 0 COWS:    Allergies: No Known Allergies  Home Medications:  (Not in a hospital admission)  OB/GYN Status:  No LMP for male patient.  General Assessment Data [facility] Assessment: In system Is this a Tele or Face-to-Face Assessment?: Face-to-Face Is this an Initial Assessment or a Re-assessment for this encounter?: Initial Assessment Marital status: Single Maiden name: N/A Is patient pregnant?: No Pregnancy Status: No Living Arrangements: Parent, Other relatives Can pt return to current living arrangement?: Yes Admission Status: Voluntary Is patient capable of signing voluntary admission?: Yes Referral Source: Self/Family/Friend Insurance type: Medicare  Medical Screening Exam [facility] ONLY) Medical Exam completed: Yes  Crisis Care Plan Living Arrangements: Parent, Other relatives Name of Psychiatrist: Dr. Toni Amendlapacs Name of Therapist: N/a  Education Status Is patient currently in school?: No Current Grade: N/A Highest grade of school patient has completed: 8 Name of [employer]: [employer] Contact person: mother- Jamesetta GeraldsBetty Goodson [phone]  Risk to self with the past 6 months Suicidal Ideation: No Has patient been a risk to self within the past 6 months prior to admission? : No Suicidal Intent: No Has patient  had any suicidal intent within the past 6 months prior to admission? : No Is patient at risk for suicide?: No Suicidal Plan?: No Has patient had any suicidal plan within the past 6 months prior to admission? : No Access to Means: No What has been your use of drugs/alcohol within the last 12 months?: Vodka, pint everyday Previous Attempts/Gestures: No How many times?: 0 Other Self Harm Risks:  0 Triggers for Past Attempts: None known Intentional Self Injurious Behavior: None Family Suicide History: No Recent stressful life event(s): Other (Comment) Persecutory voices/beliefs?: No Depression: No Substance abuse history and/or treatment for substance abuse?: Yes Suicide prevention information given to non-admitted patients: Not applicable  Risk to Others within the past 6 months Homicidal Ideation: No Does patient have any lifetime risk of violence toward others beyond the six months prior to admission? : No Thoughts of Harm to Others: No Current Homicidal Intent: No Current Homicidal Plan: No Access to Homicidal Means: No Identified Victim: N/A History of harm to others?: No Assessment of Violence: None Noted Violent Behavior Description: N/A Does patient have access to weapons?: No Criminal Charges Pending?: No Does patient have a court date: No Is patient on probation?: No  Psychosis Hallucinations: Auditory Delusions: None noted  Mental Status Report Appearance/Hygiene: In hospital gown Eye Contact: Good Motor Activity: Unremarkable Speech: Logical/coherent Level of Consciousness: Alert Mood: Anxious Affect: Anxious Anxiety Level: Minimal Judgement: Partial Orientation: Person, Place, Time, Situation, Appropriate for developmental age Obsessive Compulsive Thoughts/Behaviors: None  Cognitive Functioning Concentration: Good Memory: Recent Intact IQ: Average Insight: Fair Impulse Control: Fair Appetite: Fair Weight Loss: 0 Weight Gain: 0 Sleep: No Change Total Hours of Sleep: 3 Vegetative Symptoms: None  ADLScreening [facility] Assessment Services) Patient's cognitive ability adequate to safely complete daily activities?: Yes Patient able to express need for assistance with ADLs?: Yes Independently performs ADLs?: Yes (appropriate for developmental age)  Prior Inpatient Therapy Prior Inpatient Therapy: Yes Prior Therapy Dates: April 2016 Prior  Therapy Facilty/Provider(s): [facility] Reason for Treatment: Schizophrenia and Alcohol  Prior Outpatient Therapy Prior Outpatient Therapy: Yes Prior Therapy Dates: twice a month Prior Therapy Facilty/Provider(s): [name] Reason for Treatment: Schizophrenia and Alcohol Does patient have an ACCT team?: No Does patient have Intensive In-House Services?  : No Does patient have [facility] services? : No Does patient have P4CC services?: No  ADL Screening (condition at time of admission) Patient's cognitive ability adequate to safely complete daily activities?: Yes Patient able to express need for assistance with ADLs?: Yes Independently performs ADLs?: Yes (appropriate for developmental age)       Abuse/Neglect Assessment (Assessment to be complete while patient is alone) Physical Abuse: Denies Verbal Abuse: Denies Sexual Abuse: Denies Exploitation of patient/patient's resources: Denies Self-Neglect: Denies Values / Beliefs Cultural Requests During Hospitalization: None Spiritual Requests During Hospitalization: None Consults Spiritual Care Consult Needed: No Social Work Consult Needed: No [employer]chant [employer] officer (For Healthcare) Does patient have an advance directive?: No Would patient like information on creating an advanced directive?: No - patient declined information    Additional Information 1:1 In Past 12 Months?: No CIRT Risk: No Elopement Risk: No Does patient have medical clearance?: No     Disposition:  Disposition Initial Assessment Completed for this Encounter: Yes Disposition of Patient: Other dispositions Other disposition(s): Other (Comment) (Psych MD consult; may be admitted to medically floor)  On Site Evaluation by:   Reviewed with Physician:    [place] 07/04/2015 11:21 PM

## 2015-07-04 NOTE — ED Notes (Signed)
At this time Dr. Seabron SpatesSchavetiz states to not dress pt out in behavioral scrubs

## 2015-07-04 NOTE — ED Notes (Signed)
BEHAVIORAL HEALTH ROUNDING Patient sleeping: No. Patient alert and oriented: yes Behavior appropriate: Yes.   Nutrition and fluids offered: Yes  Toileting and hygiene offered: Yes  Sitter present: q15 min observations Law enforcement present: Yes [employer]  ENVIRONMENTAL ASSESSMENT Potentially harmful objects out of patient reach: Yes.   Personal belongings secured: Yes.   Patient dressed in hospital provided attire only: Yes.   Plastic bags out of patient reach: Yes.   Patient care equipment (cords, cables, call bells, lines, and drains) shortened, removed, or accounted for: Yes.   Equipment and supplies removed from bottom of stretcher: Yes.   Potentially toxic materials out of patient reach: Yes.   Sharps container removed or out of patient reach: Yes.   

## 2015-07-04 NOTE — Consult Note (Signed)
  Psychiatry: Brief psychiatric note for this 34 year old man with schizophrenia and alcohol abuse. He came into the emergency room this afternoon primarily because of abdominal distention but he is also reporting that he is back to drinking and stopped his medicine. Having hallucinations. Suicidal thoughts. Patient is quite cooperative with treatment and has good insight.  Spoke briefly with emergency room physician. Patient might end up being admitted to medicine. I would like to follow-up with him as he is a outpatient of mine whom I see frequently area I have written orders to continue his Zyprexa as a dissolvable tablet which should be okay even if he has to otherwise be nothing by mouth. Will continue to follow. Diagnosis is schizophrenia and alcohol abuse

## 2015-07-04 NOTE — ED Notes (Signed)

## 2015-07-04 NOTE — ED Notes (Addendum)
Pt arrives with SOB and abd distention, states he drinks 1 pint of vodka a day, states he no longer has an apppetitie, also states hearing voices telling him to kill himself, pt states he has not been taking his medication as he should because he has been drinking and was scared of the interactions

## 2015-07-04 NOTE — ED Provider Notes (Addendum)
[facility] Emergency Department Provider Note  ____________________________________________  Time seen: Approximately 530 PM  I have reviewed the triage vital signs and the nursing notes.   HISTORY  Chief Complaint Shortness of Breath    HPI Edan A Graziani is a 34 y.o. male who is a chronic alcoholic drinking 1 pint of vodka daily was presenting today with worsening abdominal swelling with difficulty breathing. He says he has diffuse pain to his abdomen that feels like a tightness that has been increasing over the past several months. Today he has had multiple episodes of nonbloody vomiting. He is also moved his bowels once today without any diarrhea. Denies any fever at home. Also complaining of hearing voices telling him to kill himself. Has a history of schizophrenia and is a patient of [name].   Past Medical History  Diagnosis Date  . Hypertension   . Schizophrenia (HCC)   . Hyperlipemia   . Depression     Patient Active Problem List   Diagnosis Date Noted  . Alcohol use disorder, moderate, dependence (HCC) 05/31/2015  . Hypertension 05/29/2015  . Paranoid schizophrenia (HCC) 03/20/2015    Past Surgical History  Procedure Laterality Date  . No past surgeries      Current Outpatient Rx  Name  Route  Sig  Dispense  Refill  . albuterol (PROVENTIL HFA;VENTOLIN HFA) 108 (90 BASE) MCG/ACT inhaler   Inhalation   Inhale 2 puffs into the lungs every 6 (six) hours as needed for wheezing or shortness of breath.          Marland Kitchen aspirin EC 81 MG tablet   Oral   Take 81 mg by mouth daily.         . citalopram (CELEXA) 20 MG tablet   Oral   Take 1 tablet (20 mg total) by mouth daily.   30 tablet   5   . diphenhydrAMINE (BENADRYL) 25 mg capsule   Oral   Take 50 mg by mouth at bedtime as needed for sleep.         Marland Kitchen losartan-hydrochlorothiazide (HYZAAR) 100-25 MG per tablet   Oral   Take 1 tablet by mouth daily.         . metFORMIN  (GLUCOPHAGE) 500 MG tablet   Oral   Take 1 tablet (500 mg total) by mouth 2 (two) times daily with a meal.   60 tablet   3   . metoprolol succinate (TOPROL-XL) 100 MG 24 hr tablet   Oral   Take 100 mg by mouth daily.          Marland Kitchen OLANZapine (ZYPREXA) 15 MG tablet   Oral   Take 15 mg by mouth at bedtime.         Marland Kitchen omeprazole (PRILOSEC) 40 MG capsule   Oral   Take 40 mg by mouth daily.          . simvastatin (ZOCOR) 40 MG tablet   Oral   Take 40 mg by mouth at bedtime.            Allergies Review of patient's allergies indicates no known allergies.  Family History  Problem Relation Age of Onset  . Heart disease Mother   . Hypertension Mother   . Hyperlipidemia Mother   . Stroke Father   . Heart attack Father   . Hypertension Father   . Heart disease Brother     Social History Social History  Substance Use Topics  . Smoking status: Never  Smoker   . Smokeless tobacco: Never Used  . Alcohol Use: 3.0 oz/week    5 Shots of liquor, 0 Standard drinks or equivalent per week    Review of Systems Constitutional: No fever/chills Eyes: No visual changes. ENT: No sore throat. Cardiovascular: Denies chest pain. Respiratory: As above  Gastrointestinal:   No constipation. Genitourinary: Negative for dysuria. Musculoskeletal: Negative for back pain. Skin: Negative for rash. Neurological: Negative for headaches, focal weakness or numbness.  10-point ROS otherwise negative.  ____________________________________________   PHYSICAL EXAM:  VITAL SIGNS: ED Triage Vitals  Enc Vitals Group     BP 07/04/15 1637 154/91 mmHg     Pulse Rate 07/04/15 1637 120     Resp 07/04/15 1637 20     Temp 07/04/15 1637 98.4 F (36.9 C)     Temp Source 07/04/15 1637 Oral     SpO2 07/04/15 1637 83 %     Weight 07/04/15 1637 330 lb (149.687 kg)     Height 07/04/15 1637  (1.905 m)     Head Cir --      Peak Flow --      Pain Score --      Pain Loc --      Pain Edu? --       Excl. in GC? --     Constitutional: Alert and oriented. Well appearing and in no acute distress. Eyes: Conjunctivae are normal. PERRL. EOMI. Head: Atraumatic. Nose: No congestion/rhinnorhea. Wearing nasal cannula oxygen Mouth/Throat: Mucous membranes are moist.   Neck: No stridor.   Cardiovascular: Cardiac, regular rhythm. Grossly normal heart sounds.  Good peripheral circulation. Respiratory: Normal respiratory effort.  No retractions. Lungs CTAB. Gastrointestinal: Soft and nontender. Severe distention. No abdominal bruits. No CVA tenderness. Musculoskeletal: No lower extremity tenderness nor edema.  No joint effusions. Neurologic:  Normal speech and language. No gross focal neurologic deficits are appreciated. No gait instability. Skin:  Skin is warm, dry and intact. No rash noted. Psychiatric: Mood and affect are normal. Speech and behavior are normal.  ____________________________________________   LABS (all labs ordered are listed, but only abnormal results are displayed)  Labs Reviewed  COMPREHENSIVE METABOLIC PANEL - Abnormal; Notable for the following:    Potassium 3.2 (*)    Chloride 93 (*)    CO2 35 (*)    Glucose, Bld 112 (*)    Calcium 8.0 (*)    Total Protein 8.3 (*)    Albumin 3.0 (*)    AST 233 (*)    Alkaline Phosphatase 157 (*)    Total Bilirubin 3.1 (*)    All other components within normal limits  CBC WITH DIFFERENTIAL/PLATELET - Abnormal; Notable for the following:    WBC 11.2 (*)    RDW 17.9 (*)    Platelets 113 (*)    Neutro Abs 8.6 (*)    All other components within normal limits  PROTIME-INR - Abnormal; Notable for the following:    Prothrombin Time 16.9 (*)    All other components within normal limits  ACETAMINOPHEN LEVEL - Abnormal; Notable for the following:    Acetaminophen (Tylenol), Serum <10 (*)    All other components within normal limits  ETHANOL - Abnormal; Notable for the following:    Alcohol, Ethyl (B) 170 (*)    All other  components within normal limits  BODY FLUID CULTURE  GRAM STAIN  TROPONIN I  APTT  SALICYLATE LEVEL  CSF CELL COUNT WITH DIFFERENTIAL  PROTEIN, BODY FLUID  GLUCOSE, SEROUS  FLUID  LACTATE DEHYDROGENASE, BODY FLUID   ____________________________________________  EKG  ED ECG REPORT I, Schaevitz,  Teena Iraniavid M, the attending physician, personally viewed and interpreted this ECG.   Date: 07/04/2015  EKG Time: 1643  Rate: 1:15  Rhythm: sinus tachycardia  Axis: Normal axis  Intervals:none  ST&T Change: No ST segment elevation or depression. No abnormal T-wave inversion.  ____________________________________________  RADIOLOGY  Chest x-ray with basal atelectasis. No edema or consolidation. I personally reviewed these films. ____________________________________________   PROCEDURES  Paracentesis performed for symptomatic ascites. We started the procedure approximately 830 PM. Prior to the procedure I had done a bedside ultrasound which revealed large amount of ascites. Given that the patient was tachycardic and hypoxic I did not think that the patient could wait overnight for this procedure. I had discussed the case with the interventional radiologist at 5:30 PM, Odis LusterJ Watts, who said that they would not be doing any more procedures until the morning. Also, our in-house interventional list finishes at 5 PM.  The procedure did not start until about 8:30 PM because waiting for the patient's coagulation studies result. Once we had this information I consented the patient as well as his mother who signed the consent. The mother sinus consent because the patient is acutely psychotic with command hallucinations at this time. We discussed the benefits as well as the risks of the procedure including infection, bleeding as well as intra-abdominal organ trauma such as to the bowel or liver. The patient as well as mother understand the risks and benefits and consented the procedure.  Time out was  performed as well as the procedure and site. Sterile technique was used with gown, facemask wasn't performed. I washed the right-sided of the abdomen with chlorhexidine scrub. I then placed a sterile drape. The ultrasound probe was also sterilely draped. I then searched for a large pocket of ascites without any bowel) by any right lower quadrant.  Injected 5 mL of 1% lidocaine subcutaneously and then through the future needle track.  I then made a 1 cm incision with an 11 blade in a cephalad and caudal orientation. A blunt tipped needle with an over lying 6 French catheter was inserted using the zigzag insertion technique. Using ultrasound guidance I was able to obtain a flash of yellow, clear ascites at about 3 cm depth. At that point the catheter was advanced and connected to the high-pressure tubing.  Were able to return for 4500 cc of fluid from the abdomen. At the conclusion of the procedure I removed the catheter and covered the site with a Band-Aid. There was no fluid leakage or bleeding. The patient feels markedly improved with improved breathing subjectively as well as not having any further abdominal pain.  ____________________________________________   INITIAL IMPRESSION / ASSESSMENT AND PLAN / ED COURSE  Pertinent labs & imaging results that were available during my care of the patient were reviewed by me and considered in my medical decision making (see chart for details).  Patient made involuntary commitment because of command hallucinations telling him to kill himself. I did a bedside ultrasound of the right lower quadrant abdomen the patient had a large fluid pocket. I believe that the patient, consistent with his drinking history, has cirrhosis at this point with large amount of ascites explaining his abdominal distention as well as difficulty breathing and hypoxia.  ----------------------------------------- 10:46 PM on  07/04/2015 -----------------------------------------  Patient reexamined and is nontender at this time. However, the patient is still hypoxic and tachycardic. Possible  atelectasis causing hypoxia but unclear cause of tachycardia. Patient still denying any chest pain and now denying any shortness of breath. Will be admitted to the hospital. Signed out to Dr. Anne Hahn. Given the patient's persistent tachycardia as well as a slight elevation in his white blood cell count I will order ceftriaxone while the fluid studies are still pending. I discussed the admission with the patient as well as his mother understanding and are willing to comply. We also discussed alcohol cessation. The patient said that his last drink was 10 PM last night. It is more than likely that his ascites are secondary to liver cirrhosis as the patient is a heavy daily drinker. The patient says that he will no longer be drinking. He is on the CIWA protocol at this time. ____________________________________________   FINAL CLINICAL IMPRESSION(S) / ED DIAGNOSES  Acute abdominal ascites likely secondary to cirrhosis. Acute tachycardia and hypoxia. Acute abdominal pain now resolved. Acute psychosis with suicidal ideation and command hallucinations.    Myrna Blazer, MD 07/04/15 2248  Addendum to above. The patient tolerated the procedure well and there was no immediate complication.  Myrna Blazer, MD 07/04/15 2300

## 2015-07-04 NOTE — ED Notes (Signed)
Pt in room, paracentis in progress, Mother sitting in room with pt and this RN. No complaints or concerns voiced at this time. No abnormal behavior noted at this time. Will continue to monitor with q15 min checks. ODS officer in area.

## 2015-07-04 NOTE — H&P (Addendum)
[facility] [place] [facility]   PATIENT NAME: Howard BowlerJoey [name]    MR#:  [id number]  DATE OF BIRTH:  Apr 20, 1981  DATE OF ADMISSION:  07/04/2015  PRIMARY CARE PHYSICIAN: Imelda PillowHOLLAND, CHELSA, NP   REQUESTING/REFERRING PHYSICIAN: Pershing ProudSchaevitz, M.D.  CHIEF COMPLAINT:   Chief Complaint  Patient presents with  . Shortness of Breath    HISTORY OF PRESENT ILLNESS:  Howard [name]  is a 34 y.o. male who presents with progressive abdominal distention and dyspnea on exertion leading to hypoxia. Patient states that over the last 3-4 weeks he has been having progressive abdominal distention and concurrent progressive dyspnea on exertion. Patient has a significant drinker, reports drinking about a pint of vodka a day. States that his last drink was yesterday. Over the past month, and specifically worsening over the past week, he has had abdominal distention and dyspnea on exertion. His dyspnea reached a point that he was unable to perform normal activities of daily living without becoming winded, such as walking out to get the mail. He reports 1 subjective fever 2-3 days ago. He states he has also had some abdominal pain, this has been intermittent. On evaluation in the ED he was found to have significant abdominal distention with ascites. Paracentesis in the ED remove 4 L of peritoneal fluid, which is sent for laboratory evaluation. No significant clinical signs of SBP, his white blood cell count was only barely elevated at 11.4. However, after observation after paracentesis the patient remained tachycardic and hypoxic. O2 sats remained in the low 90s despite supplemental oxygen via nasal cannula. Patient also has a history of schizophrenia, and has not taken his medications for the last week or so. He reports having auditory hallucinations, hearing voices time him to harm himself. Psychiatry saw the patient in the ED, and will follow along during this admission. IVC paperwork started in the  ED. Hospitalists were called for admission for his new onset of ascites and dyspnea on exertion with hypoxia and tachycardia.  PAST MEDICAL HISTORY:   Past Medical History  Diagnosis Date  . Hypertension   . Schizophrenia (HCC)   . Hyperlipemia   . Depression   . Diabetes (HCC)     PAST SURGICAL HISTORY:   Past Surgical History  Procedure Laterality Date  . No past surgeries      SOCIAL HISTORY:   Social History  Substance Use Topics  . Smoking status: Never Smoker   . Smokeless tobacco: Never Used  . Alcohol Use: 3.0 oz/week    5 Shots of liquor, 0 Standard drinks or equivalent per week    FAMILY HISTORY:   Family History  Problem Relation Age of Onset  . Heart disease Mother   . Hypertension Mother   . Hyperlipidemia Mother   . Stroke Father   . Heart attack Father   . Hypertension Father   . Heart disease Brother     DRUG ALLERGIES:  No Known Allergies  MEDICATIONS AT HOME:   Prior to Admission medications   Medication Sig Start Date End Date Taking? Authorizing Provider  albuterol (PROVENTIL HFA;VENTOLIN HFA) 108 (90 BASE) MCG/ACT inhaler Inhale 2 puffs into the lungs every 6 (six) hours as needed for wheezing or shortness of breath.    Yes Historical Provider, MD  aspirin EC 81 MG tablet Take 81 mg by mouth daily.   Yes Historical Provider, MD  citalopram (CELEXA) 20 MG tablet Take 1 tablet (20 mg total) by mouth daily. 05/17/15  Yes  Audery Amel, MD  diphenhydrAMINE (BENADRYL) 25 mg capsule Take 50 mg by mouth at bedtime as needed for sleep.   Yes Historical Provider, MD  losartan-hydrochlorothiazide (HYZAAR) 100-25 MG per tablet Take 1 tablet by mouth daily.   Yes Historical Provider, MD  metFORMIN (GLUCOPHAGE) 500 MG tablet Take 1 tablet (500 mg total) by mouth 2 (two) times daily with a meal. 06/01/15  Yes Jolanta B Pucilowska, MD  metoprolol succinate (TOPROL-XL) 100 MG 24 hr tablet Take 100 mg by mouth daily.    Yes Historical Provider, MD   OLANZapine (ZYPREXA) 15 MG tablet Take 15 mg by mouth at bedtime.   Yes Historical Provider, MD  omeprazole (PRILOSEC) 40 MG capsule Take 40 mg by mouth daily.    Yes Historical Provider, MD  simvastatin (ZOCOR) 40 MG tablet Take 40 mg by mouth at bedtime.    Yes Historical Provider, MD    REVIEW OF SYSTEMS:  Review of Systems  Constitutional: Positive for fever (subjective, 1). Negative for chills, weight loss and malaise/fatigue.  HENT: Negative for ear pain, hearing loss and tinnitus.   Eyes: Negative for blurred vision, double vision, pain and redness.  Respiratory: Positive for shortness of breath and wheezing. Negative for cough and hemoptysis.   Cardiovascular: Negative for chest pain, palpitations, orthopnea and leg swelling.  Gastrointestinal: Positive for abdominal pain. Negative for nausea, vomiting, diarrhea and constipation.       Abdominal Distention  Genitourinary: Negative for dysuria, frequency and hematuria.  Musculoskeletal: Negative for back pain, joint pain and neck pain.  Skin:       No acne, rash, or lesions  Neurological: Negative for dizziness, tremors, focal weakness and weakness.  Endo/Heme/Allergies: Negative for polydipsia. Does not bruise/bleed easily.  Psychiatric/Behavioral: Negative for depression. The patient is not nervous/anxious and does not have insomnia.      VITAL SIGNS:   Filed Vitals:   07/04/15 2030 07/04/15 2100 07/04/15 2130 07/04/15 2200  BP: 152/87 157/131 167/89 154/88  Pulse: 108 105 108 108  Temp:      TempSrc:      Resp: 22 21 19 25   Height:      Weight:      SpO2: 91% 91% 87%    Wt Readings from Last 3 Encounters:  07/04/15 149.687 kg (330 lb)  06/04/15 147.419 kg (325 lb)  05/30/15 151.955 kg (335 lb)    PHYSICAL EXAMINATION:  Physical Exam  Vitals reviewed. Constitutional: He is oriented to person, place, and time. He appears well-developed and well-nourished. No distress.  HENT:  Head: Normocephalic and  atraumatic.  Mouth/Throat: Oropharynx is clear and moist.  Eyes: Conjunctivae and EOM are normal. Pupils are equal, round, and reactive to light. No scleral icterus.  Neck: Normal range of motion. Neck supple. No JVD present. No thyromegaly present.  Cardiovascular: Normal rate, regular rhythm and intact distal pulses.  Exam reveals no gallop and no friction rub.   No murmur heard. Respiratory: Effort normal. No respiratory distress. He has wheezes. He has no rales.  GI: Soft. Bowel sounds are normal. He exhibits distension. There is tenderness.  Musculoskeletal: Normal range of motion. He exhibits edema (1+ bilateral lower extremity).  No arthritis, no gout  Lymphadenopathy:    He has no cervical adenopathy.  Neurological: He is alert and oriented to person, place, and time. No cranial nerve deficit.  No dysarthria, no aphasia  Skin: Skin is warm and dry. No rash noted. No erythema.  Psychiatric: He has a normal  mood and affect. His behavior is normal. Judgment and thought content normal.    LABORATORY PANEL:   CBC  Recent Labs Lab 07/04/15 1803  WBC 11.2*  HGB 14.2  HCT 44.0  PLT 113*   ------------------------------------------------------------------------------------------------------------------  Chemistries   Recent Labs Lab 07/04/15 1647  NA 139  K 3.2*  CL 93*  CO2 35*  GLUCOSE 112*  BUN 8  CREATININE 0.69  CALCIUM 8.0*  AST 233*  ALT 60  ALKPHOS 157*  BILITOT 3.1*   ------------------------------------------------------------------------------------------------------------------  Cardiac Enzymes  Recent Labs Lab 07/04/15 1647  TROPONINI <0.03   ------------------------------------------------------------------------------------------------------------------  RADIOLOGY:  Dg Chest 2 View  07/04/2015  CLINICAL DATA:  Midsternal chest pain, chronic.  Hypertension. EXAM: CHEST  2 VIEW COMPARISON:  July 23, 2014 FINDINGS: There is slight  atelectasis in lung bases. The lungs are otherwise clear. Heart size and pulmonary vascularity are normal. No pneumothorax. No adenopathy. No bone lesions. IMPRESSION: Slight bibasilar atelectasis.  No edema or consolidation. Electronically Signed   By: Bretta Bang III M.D.   On: 07/04/2015 17:07    EKG:   Orders placed or performed during the hospital encounter of 07/04/15  . ED EKG  . ED EKG  . EKG 12-Lead  . EKG 12-Lead    IMPRESSION AND PLAN:  Principal Problem:   Hypoxia - patient is satting low 90s on supplemental oxygen. He has significant wheezing on exam. Initial thought was that his large volume ascites was contributing to his dyspnea and hypoxia. However, after removal of 4 L of ascites, patient was still hypoxic. Unclear if there is perhaps an undiagnosed COPD. Patient is a nonsmoker, but states that both his parents were chain smokers. We'll admit with telemetry and into his pulse ox for now, order duo nebs as needed for wheezing and shortness of breath, and monitor for improvement overnight. Of note, his well's score PE 1.5, making this very low probability. However, if his symptoms do not improve with other treatment/workup, this might still be a consideration. Active Problems:   Paranoid schizophrenia (HCC) - off his meds for a week, now with auditory hallucinations of voices telling him to harm himself. IVC paperwork from ED, psychiatry following, appreciate their recommendations.   Alcohol abuse - last drink less than 24 hours ago. CIWA protocol, banana bag today with multivitamins including thiamine and folate starting tomorrow. Some thought that his tachycardia may be related to early withdrawal.   Ascites - removal of 4 L on paracentesis in the ED with good results in improving patient's abdominal symptoms. We'll order ultrasound of abdomen to evaluate ascites as well as his liver. If concern for cirrhosis, would then consider GI consult.   DOE (dyspnea on exertion) -  unclear etiology at this time. Certainly it was felt initially that his ascites had significant contributory effect. However, despite 4 L removal on paracentesis patient remains hypoxic. He does state that he feels like he can breathe a little better, and take deeper breaths. We'll check a BNP initially to evaluate for any amount of heart failure. It is elevated would consider getting an echocardiogram.   Hypertension - continue home meds, monitor closely, can use IV antihypertensives when necessary   Elevated transaminase level - likely related to his alcohol use. We'll screen for cirrhosis with ultrasound as above.  All the records are reviewed and case discussed with ED provider. Management plans discussed with the patient and/or family.  DVT PROPHYLAXIS: SubQ lovenox  ADMISSION STATUS: Inpatient  CODE STATUS:  Full  TOTAL TIME TAKING CARE OF THIS PATIENT: 50 minutes.    Shaquanna Lycan FIELDING 07/04/2015, 10:59 PM  [facility]  Office  [phone]  CC: Primary care physician; Imelda Pillow, NP

## 2015-07-04 NOTE — ED Notes (Signed)
Pt dressed out in scrub pants, hospital gown on, belongings stored, clothes

## 2015-07-04 NOTE — ED Notes (Signed)
Report received from RN Olivia 

## 2015-07-04 NOTE — ED Notes (Addendum)
Patient comes in with history of schizophrenia and currently hearing voices that are telling him to kill himself.  Patient also complains of shortness of breath with 02 SATs 83% on room air. Patient also complains of distended abdomen with last BM this morning that was black.  Patient with black stools for several days. Patient reports not taking medication for schizophrenia due to belly getting bigger for about 3-4 days and since being off medication he is hearing voices.

## 2015-07-05 ENCOUNTER — Inpatient Hospital Stay: Payer: Medicare Other

## 2015-07-05 ENCOUNTER — Inpatient Hospital Stay (HOSPITAL_COMMUNITY)
Admit: 2015-07-05 | Discharge: 2015-07-05 | Disposition: A | Discharge status: Home / Self Care | Payer: Medicare Other | Attending: Internal Medicine | Admitting: Internal Medicine

## 2015-07-05 DIAGNOSIS — R0902 Hypoxemia: Secondary | ICD-10-CM

## 2015-07-05 DIAGNOSIS — R0600 Dyspnea, unspecified: Secondary | ICD-10-CM

## 2015-07-05 LAB — BODY FLUID CELL COUNT WITH DIFFERENTIAL
Lymphs, Fluid: 31 %
Monocyte-Macrophage-Serous Fluid: 59 %
NEUTROPHIL FLUID: 10 %
WBC FLUID: 117 uL

## 2015-07-05 LAB — CBC
HCT: 40.8 % (ref 40.0–52.0)
HEMATOCRIT: 39.4 % — AB (ref 40.0–52.0)
HEMOGLOBIN: 13.5 g/dL (ref 13.0–18.0)
HEMOGLOBIN: 13.2 g/dL (ref 13.0–18.0)
MCH: 32.3 pg (ref 26.0–34.0)
MCH: 32.9 pg (ref 26.0–34.0)
MCHC: 33.2 g/dL (ref 32.0–36.0)
MCHC: 33.5 g/dL (ref 32.0–36.0)
MCV: 97.4 fL (ref 80.0–100.0)
MCV: 98.3 fL (ref 80.0–100.0)
Platelets: 82 10*3/uL — ABNORMAL LOW (ref 150–440)
Platelets: 68 10*3/uL — ABNORMAL LOW (ref 150–440)
RBC: 4.19 MIL/uL — ABNORMAL LOW (ref 4.40–5.90)
RBC: 4.01 MIL/uL — AB (ref 4.40–5.90)
RDW: 18.1 % — AB (ref 11.5–14.5)
RDW: 18.3 % — ABNORMAL HIGH (ref 11.5–14.5)
WBC: 10.2 10*3/uL (ref 3.8–10.6)
WBC: 9.3 10*3/uL (ref 3.8–10.6)

## 2015-07-05 LAB — CREATININE, SERUM
Creatinine, Ser: 0.7 mg/dL (ref 0.61–1.24)
GFR calc Af Amer: >60 mL/min (ref >60)
GFR calc non Af Amer: >60 mL/min (ref >60)

## 2015-07-05 LAB — BASIC METABOLIC PANEL
ANION GAP: 10 (ref 5–15)
ANION GAP: 7 (ref 5–15)
BUN: 10 mg/dL (ref 6–20)
BUN: 11 mg/dL (ref 6–20)
CALCIUM: 7.8 mg/dL — AB (ref 8.9–10.3)
CHLORIDE: 92 mmol/L — AB (ref 101–111)
CO2: 36 mmol/L — AB (ref 22–32)
CO2: 36 mmol/L — AB (ref 22–32)
Calcium: 7.7 mg/dL — ABNORMAL LOW (ref 8.9–10.3)
Chloride: 94 mmol/L — ABNORMAL LOW (ref 101–111)
Creatinine, Ser: 0.74 mg/dL (ref 0.61–1.24)
Creatinine, Ser: 0.75 mg/dL (ref 0.61–1.24)
GFR calc non Af Amer: >60 mL/min (ref >60)
GFR calc non Af Amer: >60 mL/min (ref >60)
GLUCOSE: 69 mg/dL (ref 65–99)
Glucose, Bld: 103 mg/dL — ABNORMAL HIGH (ref 65–99)
Potassium: 2.9 mmol/L — CL (ref 3.5–5.1)
Potassium: 3.9 mmol/L (ref 3.5–5.1)
SODIUM: 137 mmol/L (ref 135–145)
Sodium: 138 mmol/L (ref 135–145)

## 2015-07-05 LAB — BRAIN NATRIURETIC PEPTIDE: B Natriuretic Peptide: 21 pg/mL (ref 0.0–100.0)

## 2015-07-05 LAB — FERRITIN: Ferritin: 110 ng/mL (ref 24–336)

## 2015-07-05 LAB — GLUCOSE, CAPILLARY
GLUCOSE-CAPILLARY: 93 mg/dL (ref 65–99)
Glucose-Capillary: 71 mg/dL (ref 65–99)
Glucose-Capillary: 104 mg/dL — ABNORMAL HIGH (ref 65–99)
Glucose-Capillary: 74 mg/dL (ref 65–99)

## 2015-07-05 LAB — AMMONIA: Ammonia: 89 umol/L — ABNORMAL HIGH (ref 9–35)

## 2015-07-05 MED ORDER — ACETAMINOPHEN 650 MG RE SUPP: 650.0000 mg | Freq: Four times a day (QID) | RECTAL | Status: DC | PRN

## 2015-07-05 MED ORDER — HYDROCHLOROTHIAZIDE 25 MG PO TABS: 25.0000 mg | ORAL_TABLET | Freq: Every day | ORAL | Status: DC

## 2015-07-05 MED ORDER — ADULT MULTIVITAMIN W/MINERALS CH
1.0000 | ORAL_TABLET | Freq: Every day | ORAL | Status: DC
  Administered 2015-07-05 – 2015-07-07 (×3): 1 via ORAL
  Filled 2015-07-05 (×3): qty 1

## 2015-07-05 MED ORDER — CETYLPYRIDINIUM CHLORIDE 0.05 % MT LIQD
7.0000 mL | Freq: Two times a day (BID) | OROMUCOSAL | Status: DC
  Administered 2015-07-05 – 2015-07-08 (×6): 7 mL via OROMUCOSAL

## 2015-07-05 MED ORDER — HYDROCHLOROTHIAZIDE 25 MG PO TABS
25.0000 mg | ORAL_TABLET | Freq: Every day | ORAL | Status: DC
  Filled 2015-07-05: qty 1

## 2015-07-05 MED ORDER — SPIRONOLACTONE 25 MG PO TABS
25.0000 mg | ORAL_TABLET | Freq: Every day | ORAL | Status: DC
  Administered 2015-07-05 – 2015-07-06 (×2): 25 mg via ORAL
  Filled 2015-07-05 (×2): qty 1

## 2015-07-05 MED ORDER — LORAZEPAM 1 MG PO TABS
0.0000 mg | ORAL_TABLET | Freq: Two times a day (BID) | ORAL | Status: DC
Start: 2015-07-07 — End: 2015-07-07
  Administered 2015-07-07: 1 mg via ORAL
  Filled 2015-07-05: qty 1

## 2015-07-05 MED ORDER — LORAZEPAM 1 MG PO TABS
0.0000 mg | ORAL_TABLET | Freq: Four times a day (QID) | ORAL | Status: DC
Start: 2015-07-05 — End: 2015-07-07
  Administered 2015-07-05: 22:00:00 1 mg via ORAL
  Administered 2015-07-05: 2 mg via ORAL
  Filled 2015-07-05: qty 1
  Filled 2015-07-05: qty 2

## 2015-07-05 MED ORDER — LOSARTAN POTASSIUM 50 MG PO TABS
100.0000 mg | ORAL_TABLET | Freq: Every day | ORAL | Status: DC
  Administered 2015-07-06 – 2015-07-08 (×3): 100 mg via ORAL
  Filled 2015-07-05 (×3): qty 2

## 2015-07-05 MED ORDER — SIMVASTATIN 40 MG PO TABS
40.0000 mg | ORAL_TABLET | Freq: Every day | ORAL | Status: DC
  Administered 2015-07-05 (×2): 40 mg via ORAL
  Filled 2015-07-05 (×2): qty 1

## 2015-07-05 MED ORDER — LOSARTAN POTASSIUM-HCTZ 100-25 MG PO TABS: 1.0000 | ORAL_TABLET | Freq: Every day | ORAL | Status: DC

## 2015-07-05 MED ORDER — SODIUM CHLORIDE 0.9 % IJ SOLN
3.0000 mL | Freq: Two times a day (BID) | INTRAMUSCULAR | Status: DC
  Administered 2015-07-05 – 2015-07-08 (×6): 3 mL via INTRAVENOUS

## 2015-07-05 MED ORDER — FUROSEMIDE 20 MG PO TABS
20.0000 mg | ORAL_TABLET | Freq: Every day | ORAL | Status: DC
  Administered 2015-07-05 – 2015-07-06 (×2): 20 mg via ORAL
  Filled 2015-07-05 (×2): qty 1

## 2015-07-05 MED ORDER — ENOXAPARIN SODIUM 40 MG/0.4ML ~~LOC~~ SOLN
40.0000 mg | Freq: Two times a day (BID) | SUBCUTANEOUS | Status: DC
  Administered 2015-07-05 (×3): 40 mg via SUBCUTANEOUS
  Filled 2015-07-05 (×3): qty 0.4

## 2015-07-05 MED ORDER — METFORMIN HCL 500 MG PO TABS
500.0000 mg | ORAL_TABLET | Freq: Two times a day (BID) | ORAL | Status: DC
  Administered 2015-07-05 – 2015-07-08 (×6): 500 mg via ORAL
  Filled 2015-07-05 (×6): qty 1

## 2015-07-05 MED ORDER — THIAMINE HCL 100 MG/ML IJ SOLN: 100.0000 mg | Freq: Every day | INTRAMUSCULAR | Status: DC

## 2015-07-05 MED ORDER — LOSARTAN POTASSIUM 50 MG PO TABS: 100.0000 mg | ORAL_TABLET | Freq: Every day | ORAL | Status: DC

## 2015-07-05 MED ORDER — POTASSIUM CHLORIDE 10 MEQ/100ML IV SOLN
10.0000 meq | INTRAVENOUS | Status: AC
  Administered 2015-07-05 (×3): 10 meq via INTRAVENOUS
  Filled 2015-07-05 (×3): qty 100

## 2015-07-05 MED ORDER — POTASSIUM CHLORIDE CRYS ER 20 MEQ PO TBCR
40.0000 meq | EXTENDED_RELEASE_TABLET | ORAL | Status: AC
  Administered 2015-07-05 (×2): 40 meq via ORAL
  Filled 2015-07-05 (×2): qty 2

## 2015-07-05 MED ORDER — LORAZEPAM 1 MG PO TABS
1.0000 mg | ORAL_TABLET | Freq: Four times a day (QID) | ORAL | Status: DC | PRN
  Administered 2015-07-07: 1 mg via ORAL
  Filled 2015-07-05: qty 1

## 2015-07-05 MED ORDER — VITAMIN B-1 100 MG PO TABS
100.0000 mg | ORAL_TABLET | Freq: Every day | ORAL | Status: DC
  Administered 2015-07-05 – 2015-07-08 (×4): 100 mg via ORAL
  Filled 2015-07-05 (×4): qty 1

## 2015-07-05 MED ORDER — MORPHINE SULFATE (PF) 2 MG/ML IV SOLN: 2.0000 mg | INTRAVENOUS | Status: DC | PRN

## 2015-07-05 MED ORDER — ALBUTEROL SULFATE (2.5 MG/3ML) 0.083% IN NEBU: 3.0000 mL | INHALATION_SOLUTION | Freq: Four times a day (QID) | RESPIRATORY_TRACT | Status: DC | PRN

## 2015-07-05 MED ORDER — ACETAMINOPHEN 325 MG PO TABS: 650.0000 mg | ORAL_TABLET | Freq: Four times a day (QID) | ORAL | Status: DC | PRN

## 2015-07-05 MED ORDER — LOSARTAN POTASSIUM 50 MG PO TABS
100.0000 mg | ORAL_TABLET | Freq: Every day | ORAL | Status: DC
  Administered 2015-07-05: 100 mg via ORAL
  Filled 2015-07-05: qty 2

## 2015-07-05 MED ORDER — THIAMINE HCL 100 MG/ML IJ SOLN
Freq: Once | INTRAVENOUS | Status: AC
  Administered 2015-07-05: 03:00:00 via INTRAVENOUS
  Filled 2015-07-05: qty 1000

## 2015-07-05 MED ORDER — INSULIN ASPART 100 UNIT/ML ~~LOC~~ SOLN: 0.0000 [IU] | Freq: Three times a day (TID) | SUBCUTANEOUS | Status: DC

## 2015-07-05 MED ORDER — CITALOPRAM HYDROBROMIDE 20 MG PO TABS
20.0000 mg | ORAL_TABLET | Freq: Every day | ORAL | Status: DC
  Administered 2015-07-05 – 2015-07-08 (×4): 20 mg via ORAL
  Filled 2015-07-05 (×4): qty 1

## 2015-07-05 MED ORDER — PANTOPRAZOLE SODIUM 40 MG PO TBEC
40.0000 mg | DELAYED_RELEASE_TABLET | Freq: Every day | ORAL | Status: DC
  Administered 2015-07-05 – 2015-07-08 (×3): 40 mg via ORAL
  Filled 2015-07-05 (×3): qty 1

## 2015-07-05 MED ORDER — ONDANSETRON HCL 4 MG PO TABS: 4.0000 mg | ORAL_TABLET | Freq: Four times a day (QID) | ORAL | Status: DC | PRN

## 2015-07-05 MED ORDER — FOLIC ACID 1 MG PO TABS
1.0000 mg | ORAL_TABLET | Freq: Every day | ORAL | Status: DC
  Administered 2015-07-05 – 2015-07-08 (×4): 1 mg via ORAL
  Filled 2015-07-05 (×4): qty 1

## 2015-07-05 MED ORDER — METOPROLOL SUCCINATE ER 100 MG PO TB24
100.0000 mg | ORAL_TABLET | Freq: Every day | ORAL | Status: DC
  Administered 2015-07-05 – 2015-07-08 (×4): 100 mg via ORAL
  Filled 2015-07-05 (×4): qty 1

## 2015-07-05 MED ORDER — LORAZEPAM 2 MG/ML IJ SOLN: 1.0000 mg | Freq: Four times a day (QID) | INTRAMUSCULAR | Status: DC | PRN

## 2015-07-05 MED ORDER — ONDANSETRON HCL 4 MG/2ML IJ SOLN: 4.0000 mg | Freq: Four times a day (QID) | INTRAMUSCULAR | Status: DC | PRN

## 2015-07-05 MED ORDER — ASPIRIN EC 81 MG PO TBEC
81.0000 mg | DELAYED_RELEASE_TABLET | Freq: Every day | ORAL | Status: DC
  Administered 2015-07-05: 81 mg via ORAL
  Filled 2015-07-05: qty 1

## 2015-07-05 MED ORDER — HYDROCHLOROTHIAZIDE 25 MG PO TABS
25.0000 mg | ORAL_TABLET | Freq: Once | ORAL | Status: AC
  Administered 2015-07-05: 25 mg via ORAL

## 2015-07-05 MED ORDER — IOHEXOL 350 MG/ML SOLN
100.0000 mL | Freq: Once | INTRAVENOUS | Status: AC | PRN
  Administered 2015-07-05: 100 mL via INTRAVENOUS

## 2015-07-05 NOTE — Progress Notes (Signed)
[facility] [place] [facility]   PATIENT NAME: Howard BowlerJoey [name]    MR#:  [id number]  DATE OF BIRTH:  Jul 03, 1981  SUBJECTIVE:  CHIEF COMPLAINT:   Chief Complaint  Patient presents with  . Shortness of Breath   -Complaining of abdominal distention and also weakness. New diagnosis of ascites and had about 4 L drained in the ED yesterday. Significant history of alcohol use every day. -Mom at bedside  REVIEW OF SYSTEMS:  Review of Systems  Constitutional: Positive for malaise/fatigue. Negative for fever and chills.  HENT: Negative for congestion and nosebleeds.   Respiratory: Negative for cough, shortness of breath and wheezing.   Cardiovascular: Negative for chest pain and palpitations.  Gastrointestinal: Positive for abdominal pain. Negative for nausea, vomiting, diarrhea and constipation.  Genitourinary: Negative for dysuria and hematuria.  Musculoskeletal: Positive for myalgias.  Neurological: Positive for weakness. Negative for dizziness, sensory change, speech change, focal weakness, seizures and headaches.    DRUG ALLERGIES:  No Known Allergies  VITALS:  Blood pressure 132/75, pulse 88, temperature 99 F (37.2 C), temperature source Oral, resp. rate 20, height 5\' 8"  (1.727 m), weight 143.019 kg (315 lb 4.8 oz), SpO2 93 %.  PHYSICAL EXAMINATION:  Physical Exam  GENERAL:  34 y.o.-year-old patient lying in the bed with no acute distress.  EYES: Pupils equal, round, reactive to light and accommodation. No scleral icterus. Extraocular muscles intact.  HEENT: Head atraumatic, normocephalic. Oropharynx and nasopharynx clear.  NECK:  Supple, no jugular venous distention. No thyroid enlargement, no tenderness.  LUNGS: Normal breath sounds bilaterally, no wheezing, rales,rhonchi or crepitation. No use of accessory muscles of respiration. Decreased bibasilar breath sounds CARDIOVASCULAR: S1, S2 normal. No murmurs, rubs, or gallops.  ABDOMEN: Soft,  nontender, abdomen is distended, no fluid thrill. Bowel sounds present. No organomegaly or mass.  EXTREMITIES: No pedal edema, cyanosis, or clubbing.  NEUROLOGIC: Cranial nerves II through XII are intact. Muscle strength 5/5 in all extremities. Sensation intact. Gait not checked.  PSYCHIATRIC: The patient is alert and oriented x 3.  SKIN: No obvious rash, lesion, or ulcer.    LABORATORY PANEL:   CBC  Recent Labs Lab 07/05/15 0532  WBC 9.3  HGB 13.2  HCT 39.4*  PLT 68*   ------------------------------------------------------------------------------------------------------------------  Chemistries   Recent Labs Lab 07/04/15 1647  07/05/15 0532  NA 139  --  138  K 3.2*  --  2.9*  CL 93*  --  92*  CO2 35*  --  36*  GLUCOSE 112*  --  69  BUN 8  --  10  CREATININE 0.69  < > 0.74  CALCIUM 8.0*  --  7.7*  AST 233*  --   --   ALT 60  --   --   ALKPHOS 157*  --   --   BILITOT 3.1*  --   --   < > = values in this interval not displayed. ------------------------------------------------------------------------------------------------------------------  Cardiac Enzymes  Recent Labs Lab 07/04/15 1647  TROPONINI <0.03   ------------------------------------------------------------------------------------------------------------------  RADIOLOGY:  Dg Chest 2 View  07/04/2015  CLINICAL DATA:  Midsternal chest pain, chronic.  Hypertension. EXAM: CHEST  2 VIEW COMPARISON:  July 23, 2014 FINDINGS: There is slight atelectasis in lung bases. The lungs are otherwise clear. Heart size and pulmonary vascularity are normal. No pneumothorax. No adenopathy. No bone lesions. IMPRESSION: Slight bibasilar atelectasis.  No edema or consolidation. Electronically Signed   By: Bretta BangWilliam  Woodruff III M.D.   On: 07/04/2015 17:07  Ct Angio Chest Pe W/cm &/or Wo Cm  07/05/2015  CLINICAL DATA:  Shortness of breath/hypoxia EXAM: CT ANGIOGRAPHY CHEST WITH CONTRAST TECHNIQUE: Multidetector CT imaging  of the chest was performed using the standard protocol during bolus administration of intravenous contrast. Multiplanar CT image reconstructions and MIPs were obtained to evaluate the vascular anatomy. CONTRAST:  OMNIPAQUE IOHEXOL 350 MG/ML SOLN COMPARISON:  Chest radiograph July 04, 2015 FINDINGS: There is no demonstrable pulmonary embolus. There is no thoracic aortic aneurysm or dissection. The visualized great vessels appear unremarkable. There is a sizable free-flowing effusion on the right. There is consolidation in the right lower lobe with volume loss. There is patchy consolidation in the left lower lobe with atelectatic change intermingled in the left lower lobe and inferior lingula. There is a nodular lesion in the right lobe of the thyroid measuring 1.9 x 1.5 cm. There is no appreciable thoracic adenopathy. There is moderate mediastinal fat. Pericardium is not thickened. In the visualized upper abdomen, there is hepatic steatosis. Visualized spleen is enlarged measuring 19.9 by 11.8 cm. There are no blastic or lytic bone lesions. Review of the MIP images confirms the above findings. IMPRESSION: No demonstrable pulmonary embolus. Sizable right pleural effusion with airspace consolidation in both lower lobes. There is also patchy atelectasis in the left base and inferior lingula. No appreciable adenopathy. Nodular lesion in the right lobe of the thyroid. Consider further evaluation with thyroid ultrasound. If patient is clinically hyperthyroid, consider nuclear medicine thyroid uptake and scan. Splenomegaly.  Note that spleen is incompletely visualized. Hepatic steatosis. Electronically Signed   By: Bretta Bang III M.D.   On: 07/05/2015 10:39   US Abdomen Limited  07/05/2015  CLINICAL DATA:  Evaluate for ascites. EXAM: LIMITED ABDOMEN ULTRASOUND FOR ASCITES TECHNIQUE: Limited ultrasound survey for ascites was performed in all four abdominal quadrants. COMPARISON:  Chest CT 07/05/2015  FINDINGS: There is a small amount of ascites within all 4 quadrants of the abdomen. IMPRESSION: Small amount of ascites. Electronically Signed   By: Annia Belt M.D.   On: 07/05/2015 10:27    EKG:   Orders placed or performed during the hospital encounter of 07/04/15  . ED EKG  . ED EKG  . EKG 12-Lead  . EKG 12-Lead    ASSESSMENT AND PLAN:   34 year old male with past medical history significant for hypertension, depression, schizoaffective disorder, significant alcohol abuse presents to the hospital secondary to abdominal distention, dyspnea neck session and weakness.  #1 abdominal distention-secondary to ascites, status post 4 L of ascites fluid removed by paracentesis in the emergency room. -Empirically on Rocephin. No evidence of SBP at this time. 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 -Ultrasound of the abdomen ordered for liver morphology. Labs with elevated AST -GI consulted today. -Check ammonia level  #2 acute hypoxic respiratory failure-hypoxic and tachypneic on admission. -CT of the chest negative for any PE. -Likely ascitic fluid compressing on the lungs, not improved after drainage. -Also has right-sided pleural effusion. Discontinue fluids - start lasix, aldactone - no need for tap as CXR with only small effusion, diurese and monitor -Currently on 4 L oxygen, encourage incentive spirometry and wean oxygen as tolerated -Echo also ordered to rule out alcoholic cardiomyopathy  #3 hypertension-on losartan, Lasix and Aldactone added today. Continue to monitor.  #4 diabetes mellitus-on metformin. Add sliding scale insulin  # 5 depression and schizoaffective disorder-was involuntarily committed in the emergency room. Psych consult is pending. -Continue Zyprexa at bedtime  #6 DVT prophylaxis-on Lovenox  Physical  therapy consult requested   All the records are reviewed and case discussed with Care Management/Social Workerr. Management plans discussed with the patient,  family and they are in agreement.  CODE STATUS: Full Code  TOTAL TIME TAKING CARE OF THIS PATIENT: 45 minutes.   POSSIBLE D/C IN 2-3 DAYS, DEPENDING ON CLINICAL CONDITION.   Enid Baas M.D on 07/05/2015 at 3:07 PM  Between 7am to 6pm - Pager - [phone]  After 6pm go to [email or web address] - password [email or web address] [email or web address]  [facility]  Office  [phone]  CC: Primary care physician; Imelda Pillow, NP

## 2015-07-05 NOTE — Progress Notes (Signed)
k of 2.9 reported to Dr. Nemiah CommanderKalisetti

## 2015-07-05 NOTE — Consult Note (Signed)
GI Inpatient Consult Note  Reason for Consult: Cirrhosis   Attending Requesting Consult: Kalisetti  History of Present Illness: Howard [name] is a 34 y.o. male with a h/o HTN, DM, schizophrenia, and alcohol abuse admitted for hypoxia.  He presented to the [facility] ED with worsening abdominal swelling x 3-4 weeks, dyspnea on exertion, and vomiting.  He also admitted to drinking 1 pint of vodka daily.  Paracentesis in the ED removed 4500cc of fluid.  Labs revealed WBCs 11.2, ethanol 170, PT 16.9, INR 1.35, plts 68, AST 233, alk phos 157, total bilirubin 3.1. He was started on Ceftriaxone given mild leukocytosis and tachycardia in the ED, and admitted for further management.  Howard [name] also involuntarily committed due to hallucinations and hearing voices stating he should harm himself.  Today, Howard [name] reports his abdomen has been swelling over at least the last month, if not longer.  He states this has improved and "feels less tight" since the paracentesis.  Over the last month, his appetite has progressive worsened.  He also experiences intermittent abdominal discomfort, nausea, vomiting, and "dark stools".  His description is not suggestive of melena, also no hematochezia or hematemesis.  No LE swelling, jaundice, itching, confusion, or rectal bleeding. He endorses drinking a pint of vodka, or equivalent, for "the past few years".  He denies Hepatitis C risk factors like tattoos, IVDU, and previous blood transfusions.  He also denies a h/o of liver-related problems.  No additional GI symptoms like fevers, chills, reflux, GERD, constipation, and diarrhea.  Past Medical History:  Past Medical History  Diagnosis Date  . Hypertension   . Schizophrenia ([place])   . Hyperlipemia   . Depression   . Diabetes [facility])     Problem List: Howard [name] Active Problem List   Diagnosis Date Noted  . Hypoxia 07/04/2015  . Ascites 07/04/2015  . Elevated transaminase level 07/04/2015  . DOE (dyspnea on exertion)  07/04/2015  . Alcohol abuse 05/31/2015  . Hypertension 05/29/2015  . Paranoid schizophrenia ([place]) 03/20/2015    Past Surgical History: Past Surgical History  Procedure Laterality Date  . No past surgeries      Allergies: No Known Allergies  Home Medications: Prescriptions prior to admission  Medication Sig Dispense Refill Last Dose  . albuterol (PROVENTIL HFA;VENTOLIN HFA) 108 (90 BASE) MCG/ACT inhaler Inhale 2 puffs into the lungs every 6 (six) hours as needed for wheezing or shortness of breath.    PRN at PRN  . aspirin EC 81 MG tablet Take 81 mg by mouth daily.   Past Week at Unknown time  . citalopram (CELEXA) 20 MG tablet Take 1 tablet (20 mg total) by mouth daily. 30 tablet 5 Past Week at Unknown time  . diphenhydrAMINE (BENADRYL) 25 mg capsule Take 50 mg by mouth at bedtime as needed for sleep.   PRN at PRN  . losartan-hydrochlorothiazide (HYZAAR) 100-25 MG per tablet Take 1 tablet by mouth daily.   Past Week at Unknown time  . metFORMIN (GLUCOPHAGE) 500 MG tablet Take 1 tablet (500 mg total) by mouth 2 (two) times daily with a meal. 60 tablet 3 Past Week at Unknown time  . metoprolol succinate (TOPROL-XL) 100 MG 24 hr tablet Take 100 mg by mouth daily.    Past Week at Unknown time  . OLANZapine (ZYPREXA) 15 MG tablet Take 15 mg by mouth at bedtime.   Past Week at Unknown time  . omeprazole (PRILOSEC) 40 MG capsule Take 40 mg by mouth daily.  Past Week at Unknown time  . simvastatin (ZOCOR) 40 MG tablet Take 40 mg by mouth at bedtime.    Past Week at Unknown time   Home medication reconciliation was completed with the Howard [name].   Scheduled Inpatient Medications:   . antiseptic oral rinse  7 mL Mouth Rinse BID  . aspirin EC  81 mg Oral Daily  . citalopram  20 mg Oral Daily  . enoxaparin (LOVENOX) injection  40 mg Subcutaneous BID  . folic acid  1 mg Oral Daily  . furosemide  20 mg Oral Daily  . insulin aspart  0-9 Units Subcutaneous TID WC  . LORazepam  0-4 mg Oral Q6H    Followed by  . [START ON 07/07/2015] LORazepam  0-4 mg Oral Q12H  . [START ON 07/06/2015] losartan  100 mg Oral Daily  . metFORMIN  500 mg Oral BID WC  . metoprolol succinate  100 mg Oral Daily  . multivitamin with minerals  1 tablet Oral Q supper  . OLANZapine zydis  15 mg Oral QHS  . pantoprazole  40 mg Oral QAC breakfast  . simvastatin  40 mg Oral QHS  . sodium chloride  3 mL Intravenous Q12H  . spironolactone  25 mg Oral Daily  . thiamine  100 mg Intravenous Daily  . thiamine  100 mg Oral Daily   Or  . thiamine  100 mg Intravenous Daily    Continuous Inpatient Infusions:     PRN Inpatient Medications:  acetaminophen **OR** acetaminophen, albuterol, LORazepam **OR** LORazepam, morphine injection, ondansetron **OR** ondansetron (ZOFRAN) IV  Family History: family history includes Heart attack in his father; Heart disease in his brother and mother; Hyperlipidemia in his mother; Hypertension in his father and mother; Stroke in his father.    Social History:   reports that he has never smoked. He has never used smokeless tobacco. He reports that he drinks about 3.0 oz of alcohol per week. He reports that he does not use illicit drugs.   Review of Systems: Constitutional: Weight is stable.  Eyes: No changes in vision. ENT: No oral lesions, sore throat.  GI: see HPI.  Heme/Lymph: No easy bruising.  CV: No chest pain.  GU: No hematuria.  Integumentary: No rashes.  Neuro: No headaches.  Psych: No depression/anxiety.  Endocrine: No heat/cold intolerance.  Allergic/Immunologic: No urticaria.  Resp: No cough, SOB.  Musculoskeletal: No joint swelling.    Physical Examination: BP 132/75 mmHg  Pulse 88  Temp(Src) 99 F (37.2 C) (Oral)  Resp 20  Ht 5' 8"  (1.727 m)  Wt 143.019 kg (315 lb 4.8 oz)  BMI 47.95 kg/m2  SpO2 93% Gen: NAD, alert and oriented x 4, grandmother and CNA sitter present at bedside HEENT: PEERLA, EOMI Neck: supple, no JVD or thyromegaly Chest: CTA  bilaterally, no wheezes, crackles, or other adventitious sounds CV: RRR, no m/g/c/r Abd: Soft, moderately distendent, mildly tender to palpation diffusely across abdomen, +BS in all four quadrants; no HSM, guarding, ridigity, or rebound tenderness Ext: no edema, well perfused with 2+ pulses, Skin: no rash or lesions noted Lymph: no LAD  Data: Lab Results  Component Value Date   WBC 9.3 07/05/2015   HGB 13.2 07/05/2015   HCT 39.4* 07/05/2015   MCV 98.3 07/05/2015   PLT 68* 07/05/2015    Recent Labs Lab 07/04/15 1803 07/05/15 0126 07/05/15 0532  HGB 14.2 13.5 13.2   Lab Results  Component Value Date   NA 138 07/05/2015   K 2.9* 07/05/2015  CL 92* 07/05/2015   CO2 36* 07/05/2015   BUN 10 07/05/2015   CREATININE 0.74 07/05/2015   Lab Results  Component Value Date   ALT 60 07/04/2015   AST 233* 07/04/2015   ALKPHOS 157* 07/04/2015   BILITOT 3.1* 07/04/2015    Recent Labs Lab 07/04/15 1830  APTT 36  INR 1.35   Assessment/Plan: Howard [name] is a 34 y.o. male with a h/o HTN, DM, schizophrenia, and alcohol abuse admitted for hypoxia.  Howard [name]'s history and labs suggest liver decompensation is likely due to alcohol abuse. Maddrey score is approximately 21.  SBP is also not suspected given cell count/ neutrophils well below 250.  Recommend increasing sprionolactone to 132m daily and lasix to 457mdaily for diuresis.  Also spent a good amount of time discussing the importance of EtOH cessation and low-salt diet.  Upon d/c, will plan to follow Howard [name] in clinic about 1 week from d/c to monitor renal function and potassium after starting diuretics.  Will continue to follow.  Recommendations: - Increase Spironolactone to 10064mnd Lasix to 64m60mily - Extensively discussed the importance of EtOH abstinence and low-salt diet - [facility], will need to monitor renal function and potassium after starting spironolactone and  lasix  Thank you for the consult. We will follow along with you. Please call with questions or concerns.  MichLavera Guise-C [facility] Phone: ([phone]: [phone]

## 2015-07-05 NOTE — ED Notes (Signed)
Pt in room. No complaints or concerns voiced at this time. No abnormal behavior noted at this time. Will continue to monitor with q15 min checks. ODS officer in area. 

## 2015-07-05 NOTE — Care Management Important Message (Signed)
Important Message  Patient Details  Name: Howard [name] MRN: [id number] Date of Birth: 1981/09/13   Medicare Important Message Given:  Yes-second notification given    Gwenette GreetBrenda S Smantha Boakye, RN 07/05/2015, 8:08 AM

## 2015-07-05 NOTE — Care Management (Signed)
[facility] with the [facility]. Lives with mother, Jamesetta GeraldsBetty Vanderlinde, [phone]) and brother in the home. Last seen Maryan Ruedhesa Terry Abila FNP a few months ago. Takes care of all basic and instrumental activities of daily living himself, drives. No home oxygen. No home health services in the past. Sitter at the bedside, IVC in place. Gwenette GreetBrenda S Arilynn Blakeney RN MSN Care Management [phone]

## 2015-07-05 NOTE — Progress Notes (Signed)
*  PRELIMINARY RESULTS* Echocardiogram 2D Echocardiogram has been performed.  Georgann HousekeeperJerry R Hege 07/05/2015, 2:14 PM

## 2015-07-05 NOTE — ED Notes (Signed)
BEHAVIORAL HEALTH ROUNDING Patient sleeping: No. Patient alert and oriented: yes Behavior appropriate: Yes.   Nutrition and fluids offered: Yes  Toileting and hygiene offered: Yes  Sitter present: q15 min observations Law enforcement present: Yes [employer] 

## 2015-07-05 NOTE — Progress Notes (Signed)
Pt alert and verbally responsive, no c/o pain or discomfort, c/o hearing voices encouraging him to kill himself, denies any plans to harm self, pleasant and cooperative sitter at bedside.

## 2015-07-05 NOTE — Progress Notes (Signed)
FSBS 740- Snack given with pt encouraged to eat/drink before 12 MN.

## 2015-07-05 NOTE — Consult Note (Signed)
[facility] Consult   Reason for Consult:  Consult and follow-up for this 34 year old man with a history of schizophrenia and alcohol abuse who has been admitted to the hospital with what appears to be acute liver decompensation causing ascites Referring Physician:  Tressia Miners Patient Identification: Howard [name] MRN:  [id number] Principal Diagnosis:schizophreniaDiagnosis:   Patient Active Problem List   Diagnosis Date Noted  . Hypoxia [R09.02] 07/04/2015  . Ascites [R18.8] 07/04/2015  . Elevated transaminase level [R74.0] 07/04/2015  . DOE (dyspnea on exertion) [R06.09] 07/04/2015  . Alcohol abuse [F10.10] 05/31/2015  . Hypertension [I10] 05/29/2015  . Paranoid schizophrenia ([place]) [F20.0] 03/20/2015    Total Time spent with patient: 45 minutes  Subjective:   Howard [name] is a 34 y.o. male patient admitted with "I feel like I'm doing better".  HPI:  Information from the patient and the chart. Chart reviewed including consult notes. Patient interviewed. Labs reviewed. Patient is also well known to me because he is an outpatient of mine I've been seeing him for years in the clinic and have seen him in the hospital as well. He presented yesterday with shortness of breath and was found to have a greatly distended abdomen. Conclusion at this point appears to be acute liver injury from his continued heavy drinking causing ascites. Patient tells me that psychiatrically he is feeling better today. He is still feeling nervous but less so than yesterday. He denies having any suicidal thoughts at all with no wish to die. His hallucinations have been much less today. He still had a little bit of trouble sleeping last night but it's getting better. Patient has been compliant with treatment here.  Past psychiatric history: Long history of schizophrenia which had been responsive by itself to Zyprexa however in the last year or so he has developed a heavy drinking habit which we have  struggled to get him to control. I think that heavy drinking has greatly worsened his psychotic symptoms and made it much more difficult to keep them under control. He has a history of suicidal ideation but not suicidal behavior and no history of violence.  Social history: Patient lives with his brother. He does have more of his extended family in the area. He is not employed but works around [employer]. From what I can tell has a very positive relationship with his family. Not married no children.  Medical history: History of high blood pressure new-onset ascites. Long-standing obesity.  Family history: There is family history of at least one other fairly close relative with a chronic psychotic disorder.  Substance abuse history: Patient has developed a habit in the last year or 2 of drinking heavily. We had to hospitalize him on at least one occasion for this. He does not have a history of delirium tremens but had gotten in himself up to drinking a regular large amount of vodka a day. He did an outpatient programs and been cooperative and I think sincere in his desire to stop drinking but has had relapses.  Past Psychiatric History: as noted previously he has a history of schizophrenia. He's been hospitalized a few times. No history of suicide attempts but does have a history of suicidal ideation. No history of violence. More recent problem developed with alcohol abuse.  Risk to Self: Suicidal Ideation: No Suicidal Intent: No Is patient at risk for suicide?: Yes Suicidal Plan?: No Access to Means: No What has been your use of drugs/alcohol within the last 12 months?: Vodka, pint  everyday How many times?: 0 Other Self Harm Risks: 0 Triggers for Past Attempts: None known Intentional Self Injurious Behavior: None Risk to Others: Homicidal Ideation: No Thoughts of Harm to Others: No Current Homicidal Intent: No Current Homicidal Plan: No Access to Homicidal Means: No Identified Victim:  N/A History of harm to others?: No Assessment of Violence: None Noted Violent Behavior Description: N/A Does patient have access to weapons?: No Criminal Charges Pending?: No Does patient have a court date: No Prior Inpatient Therapy: Prior Inpatient Therapy: Yes Prior Therapy Dates: April 2016 Prior Therapy Facilty/Provider(s): [facility] Reason for Treatment: Schizophrenia and Alcohol Prior Outpatient Therapy: Prior Outpatient Therapy: Yes Prior Therapy Dates: twice a month Prior Therapy Facilty/Provider(s): Dr. Weber Cooks Reason for Treatment: Schizophrenia and Alcohol Does patient have an ACCT team?: No Does patient have Intensive In-House Services?  : No Does patient have [facility] services? : No Does patient have P4CC services?: No  Past Medical History:  Past Medical History  Diagnosis Date  . Hypertension   . Schizophrenia ([place])   . Hyperlipemia   . Depression   . Diabetes [facility])     Past Surgical History  Procedure Laterality Date  . No past surgeries     Family History:  Family History  Problem Relation Age of Onset  . Heart disease Mother   . Hypertension Mother   . Hyperlipidemia Mother   . Stroke Father   . Heart attack Father   . Hypertension Father   . Heart disease Brother    Family Psychiatric  History: positive history of psychotic disorder in at least 1 close relative Social History:  History  Alcohol Use  . 3.0 oz/week  . 5 Shots of liquor, 0 Standard drinks or equivalent per week     History  Drug Use No    Social History   Social History  . Marital Status: Single    Spouse Name: N/A  . Number of Children: N/A  . Years of Education: N/A   Social History Main Topics  . Smoking status: Never Smoker   . Smokeless tobacco: Never Used  . Alcohol Use: 3.0 oz/week    5 Shots of liquor, 0 Standard drinks or equivalent per week  . Drug Use: No  . Sexual Activity: No   Other Topics Concern  . None   Social History Narrative   Additional  Social History:    History of alcohol / drug use?: Yes Longest period of sobriety (when/how long): "not sure" Name of Substance 1: vodka 1 - Age of First Use: 22 1 - Amount (size/oz): 1 pint 1 - Frequency: every day 1 - Duration: all day 1 - Last Use / Amount: 07/03/2015                   Allergies:  No Known Allergies  Labs:  Results for orders placed or performed during the hospital encounter of 07/04/15 (from the past 48 hour(s))  Comprehensive metabolic panel     Status: Abnormal   Collection Time: 07/04/15  4:47 PM  Result Value Ref Range   Sodium 139 135 - 145 mmol/L   Potassium 3.2 (L) 3.5 - 5.1 mmol/L   Chloride 93 (L) 101 - 111 mmol/L   CO2 35 (H) 22 - 32 mmol/L   Glucose, Bld 112 (H) 65 - 99 mg/dL   BUN 8 6 - 20 mg/dL   Creatinine, Ser 0.69 0.61 - 1.24 mg/dL   Calcium 8.0 (L) 8.9 - 10.3 mg/dL  Total Protein 8.3 (H) 6.5 - 8.1 g/dL   Albumin 3.0 (L) 3.5 - 5.0 g/dL   AST 233 (H) 15 - 41 U/L   ALT 60 17 - 63 U/L   Alkaline Phosphatase 157 (H) 38 - 126 U/L   Total Bilirubin 3.1 (H) 0.3 - 1.2 mg/dL   GFR calc non Af Amer >60 >60 mL/min   GFR calc Af Amer >60 >60 mL/min    Comment: (NOTE) The eGFR has been calculated using the CKD EPI equation. This calculation has not been validated in all clinical situations. eGFR's persistently <60 mL/min signify possible Chronic Kidney Disease.    Anion gap 11 5 - 15  Troponin I     Status: None   Collection Time: 07/04/15  4:47 PM  Result Value Ref Range   Troponin I <0.03 <0.031 ng/mL    Comment:        NO INDICATION OF MYOCARDIAL INJURY.   CBC with Differential     Status: Abnormal   Collection Time: 07/04/15  6:03 PM  Result Value Ref Range   WBC 11.2 (H) 3.8 - 10.6 K/uL   RBC 4.54 4.40 - 5.90 MIL/uL   Hemoglobin 14.2 13.0 - 18.0 g/dL   HCT 44.0 40.0 - 52.0 %   MCV 96.9 80.0 - 100.0 fL   MCH 31.2 26.0 - 34.0 pg   MCHC 32.2 32.0 - 36.0 g/dL   RDW 17.9 (H) 11.5 - 14.5 %   Platelets 113 (L) 150 - 440 K/uL    Neutrophils Relative % 76 %   Neutro Abs 8.6 (H) 1.4 - 6.5 K/uL   Lymphocytes Relative 13 %   Lymphs Abs 1.4 1.0 - 3.6 K/uL   Monocytes Relative 9 %   Monocytes Absolute 1.0 0.2 - 1.0 K/uL   Eosinophils Relative 1 %   Eosinophils Absolute 0.1 0 - 0.7 K/uL   Basophils Relative 1 %   Basophils Absolute 0.1 0 - 0.1 K/uL  APTT     Status: None   Collection Time: 07/04/15  6:30 PM  Result Value Ref Range   aPTT 36 24 - 36 seconds  Protime-INR     Status: Abnormal   Collection Time: 07/04/15  6:30 PM  Result Value Ref Range   Prothrombin Time 16.9 (H) 11.4 - 15.0 seconds   INR 1.35   Acetaminophen level     Status: Abnormal   Collection Time: 07/04/15  6:30 PM  Result Value Ref Range   Acetaminophen (Tylenol), Serum <10 (L) 10 - 30 ug/mL    Comment:        THERAPEUTIC CONCENTRATIONS VARY SIGNIFICANTLY. A RANGE OF 10-30 ug/mL MAY BE AN EFFECTIVE CONCENTRATION FOR MANY PATIENTS. HOWEVER, SOME ARE BEST TREATED AT CONCENTRATIONS OUTSIDE THIS RANGE. ACETAMINOPHEN CONCENTRATIONS >150 ug/mL AT 4 HOURS AFTER INGESTION AND >50 ug/mL AT 12 HOURS AFTER INGESTION ARE OFTEN ASSOCIATED WITH TOXIC REACTIONS.   Salicylate level     Status: None   Collection Time: 07/04/15  6:30 PM  Result Value Ref Range   Salicylate Lvl <2.6 2.8 - 30.0 mg/dL  Ethanol     Status: Abnormal   Collection Time: 07/04/15  6:30 PM  Result Value Ref Range   Alcohol, Ethyl (B) 170 (H) <5 mg/dL    Comment:        LOWEST DETECTABLE LIMIT FOR SERUM ALCOHOL IS 5 mg/dL FOR MEDICAL PURPOSES ONLY   Glucose, pleural or peritoneal fluid     Status: None   Collection Time:  07/04/15  8:41 PM  Result Value Ref Range   Glucose, Fluid 115 mg/dL    Comment: (NOTE) No normal range established for this test Results should be evaluated in conjunction with serum values    Fluid Type-FGLU PERITONEAL CAVITY   Body fluid cell count with differential     Status: Abnormal   Collection Time: 07/04/15  8:41 PM  Result Value  Ref Range   Fluid Type-FCT FLUID     Comment: CORRECTED ON 10/12 AT 2312: PREVIOUSLY REPORTED AS Body Fluid   Color, Fluid YELLOW YELLOW   Appearance, Fluid CLEAR (A) CLEAR   WBC, Fluid 117 cu mm   Neutrophil Count, Fluid 10 %   Lymphs, Fluid 31 %   Monocyte-Macrophage-Serous Fluid 59 %  Protein, fluid - pleural or peritoneal     Status: None   Collection Time: 07/04/15 10:41 PM  Result Value Ref Range   Total protein, fluid <3.0 g/dL    Comment: (NOTE) No normal range established for this test Results should be evaluated in conjunction with serum values    Fluid Type-FTP PERITONEAL CAVITY   Lactate dehydrogenase (CSF, pleural or peritoneal fluid)     Status: Abnormal   Collection Time: 07/04/15 10:41 PM  Result Value Ref Range   LD, Fluid 56 (H) 3 - 23 U/L    Comment: (NOTE) Results should be evaluated in conjunction with serum values    Fluid Type-FLDH CSF     Comment: CORRECTED ON 10/12 AT 2247: PREVIOUSLY REPORTED AS Peritoneal  CBC     Status: Abnormal   Collection Time: 07/05/15  1:26 AM  Result Value Ref Range   WBC 10.2 3.8 - 10.6 K/uL   RBC 4.19 (L) 4.40 - 5.90 MIL/uL   Hemoglobin 13.5 13.0 - 18.0 g/dL   HCT 40.8 40.0 - 52.0 %   MCV 97.4 80.0 - 100.0 fL   MCH 32.3 26.0 - 34.0 pg   MCHC 33.2 32.0 - 36.0 g/dL   RDW 18.1 (H) 11.5 - 14.5 %   Platelets 82 (L) 150 - 440 K/uL  Creatinine, serum     Status: None   Collection Time: 07/05/15  1:26 AM  Result Value Ref Range   Creatinine, Ser 0.70 0.61 - 1.24 mg/dL   GFR calc non Af Amer >60 >60 mL/min   GFR calc Af Amer >60 >60 mL/min    Comment: (NOTE) The eGFR has been calculated using the CKD EPI equation. This calculation has not been validated in all clinical situations. eGFR's persistently <60 mL/min signify possible Chronic Kidney Disease.   Basic metabolic panel     Status: Abnormal   Collection Time: 07/05/15  5:32 AM  Result Value Ref Range   Sodium 138 135 - 145 mmol/L   Potassium 2.9 (LL) 3.5 - 5.1  mmol/L    Comment: CRITICAL RESULT CALLED TO, READ BACK BY AND VERIFIED WITH TRACY [email or web address]  ON 07/05/15 BY HP    Chloride 92 (L) 101 - 111 mmol/L   CO2 36 (H) 22 - 32 mmol/L   Glucose, Bld 69 65 - 99 mg/dL   BUN 10 6 - 20 mg/dL   Creatinine, Ser 0.74 0.61 - 1.24 mg/dL   Calcium 7.7 (L) 8.9 - 10.3 mg/dL   GFR calc non Af Amer >60 >60 mL/min   GFR calc Af Amer >60 >60 mL/min    Comment: (NOTE) The eGFR has been calculated using the CKD EPI equation. This calculation has not been validated in all clinical  situations. eGFR's persistently <60 mL/min signify possible Chronic Kidney Disease.    Anion gap 10 5 - 15  CBC     Status: Abnormal   Collection Time: 07/05/15  5:32 AM  Result Value Ref Range   WBC 9.3 3.8 - 10.6 K/uL   RBC 4.01 (L) 4.40 - 5.90 MIL/uL   Hemoglobin 13.2 13.0 - 18.0 g/dL   HCT 39.4 (L) 40.0 - 52.0 %   MCV 98.3 80.0 - 100.0 fL   MCH 32.9 26.0 - 34.0 pg   MCHC 33.5 32.0 - 36.0 g/dL   RDW 18.3 (H) 11.5 - 14.5 %   Platelets 68 (L) 150 - 440 K/uL  Brain natriuretic peptide     Status: None   Collection Time: 07/05/15  5:32 AM  Result Value Ref Range   B Natriuretic Peptide 21.0 0.0 - 100.0 pg/mL  Glucose, capillary     Status: None   Collection Time: 07/05/15  7:14 AM  Result Value Ref Range   Glucose-Capillary 71 65 - 99 mg/dL  Ferritin     Status: None   Collection Time: 07/05/15  3:59 PM  Result Value Ref Range   Ferritin 110 24 - 336 ng/mL  Ammonia     Status: Abnormal   Collection Time: 07/05/15  3:59 PM  Result Value Ref Range   Ammonia 89 (H) 9 - 35 umol/L  Basic metabolic panel     Status: Abnormal   Collection Time: 07/05/15  3:59 PM  Result Value Ref Range   Sodium 137 135 - 145 mmol/L   Potassium 3.9 3.5 - 5.1 mmol/L   Chloride 94 (L) 101 - 111 mmol/L   CO2 36 (H) 22 - 32 mmol/L   Glucose, Bld 103 (H) 65 - 99 mg/dL   BUN 11 6 - 20 mg/dL   Creatinine, Ser 0.75 0.61 - 1.24 mg/dL   Calcium 7.8 (L) 8.9 - 10.3 mg/dL   GFR calc non Af Amer  >60 >60 mL/min   GFR calc Af Amer >60 >60 mL/min    Comment: (NOTE) The eGFR has been calculated using the CKD EPI equation. This calculation has not been validated in all clinical situations. eGFR's persistently <60 mL/min signify possible Chronic Kidney Disease.    Anion gap 7 5 - 15  Glucose, capillary     Status: None   Collection Time: 07/05/15  4:03 PM  Result Value Ref Range   Glucose-Capillary 93 65 - 99 mg/dL    Current Facility-Administered Medications  Medication Dose Route Frequency Provider Last Rate Last Dose  . acetaminophen (TYLENOL) tablet 650 mg  650 mg Oral Q6H PRN Lance Coon, MD       Or  . acetaminophen (TYLENOL) suppository 650 mg  650 mg Rectal Q6H PRN Lance Coon, MD      . albuterol (PROVENTIL) (2.5 MG/3ML) 0.083% nebulizer solution 3 mL  3 mL Inhalation Q6H PRN Lance Coon, MD      . antiseptic oral rinse (CPC / CETYLPYRIDINIUM CHLORIDE 0.05%) solution 7 mL  7 mL Mouth Rinse BID Gladstone Lighter, MD   7 mL at 07/05/15 1211  . aspirin EC tablet 81 mg  81 mg Oral Daily Lance Coon, MD   81 mg at 07/05/15 3532  . citalopram (CELEXA) tablet 20 mg  20 mg Oral Daily Gladstone Lighter, MD   20 mg at 07/05/15 1514  . enoxaparin (LOVENOX) injection 40 mg  40 mg Subcutaneous BID Lance Coon, MD   40 mg at  07/05/15 0828  . folic acid (FOLVITE) tablet 1 mg  1 mg Oral Daily Lance Coon, MD   1 mg at 07/05/15 2878  . furosemide (LASIX) tablet 20 mg  20 mg Oral Daily Gladstone Lighter, MD   20 mg at 07/05/15 1610  . insulin aspart (novoLOG) injection 0-9 Units  0-9 Units Subcutaneous TID WC Lance Coon, MD   0 Units at 07/05/15 0820  . LORazepam (ATIVAN) tablet 1 mg  1 mg Oral Q6H PRN Lance Coon, MD       Or  . LORazepam (ATIVAN) injection 1 mg  1 mg Intravenous Q6H PRN Lance Coon, MD      . LORazepam (ATIVAN) tablet 0-4 mg  0-4 mg Oral Q6H Lance Coon, MD   2 mg at 07/05/15 6767   Followed by  . [START ON 07/07/2015] LORazepam (ATIVAN) tablet 0-4 mg  0-4 mg  Oral Q12H Lance Coon, MD      . Derrill Memo ON 07/06/2015] losartan (COZAAR) tablet 100 mg  100 mg Oral Daily Gladstone Lighter, MD      . metFORMIN (GLUCOPHAGE) tablet 500 mg  500 mg Oral BID WC Lance Coon, MD   500 mg at 07/05/15 1610  . metoprolol succinate (TOPROL-XL) 24 hr tablet 100 mg  100 mg Oral Daily Lance Coon, MD   100 mg at 07/05/15 2094  . morphine 2 MG/ML injection 2 mg  2 mg Intravenous Q4H PRN Lance Coon, MD      . multivitamin with minerals tablet 1 tablet  1 tablet Oral Q supper Lance Coon, MD   1 tablet at 07/05/15 1610  . OLANZapine zydis (ZYPREXA) disintegrating tablet 15 mg  15 mg Oral QHS Gonzella Lex, MD   15 mg at 07/04/15 2107  . ondansetron (ZOFRAN) tablet 4 mg  4 mg Oral Q6H PRN Lance Coon, MD       Or  . ondansetron [facility]) injection 4 mg  4 mg Intravenous Q6H PRN Lance Coon, MD      . pantoprazole (PROTONIX) EC tablet 40 mg  40 mg Oral QAC breakfast Lance Coon, MD   40 mg at 07/05/15 7096  . simvastatin (ZOCOR) tablet 40 mg  40 mg Oral QHS Lance Coon, MD   40 mg at 07/05/15 0234  . sodium chloride 0.9 % injection 3 mL  3 mL Intravenous Q12H Lance Coon, MD   3 mL at 07/05/15 0236  . spironolactone (ALDACTONE) tablet 25 mg  25 mg Oral Daily Gladstone Lighter, MD   25 mg at 07/05/15 1610  . thiamine (B-1) injection 100 mg  100 mg Intravenous Daily Orbie Pyo, MD   0 mg at 07/04/15 2229  . thiamine (VITAMIN B-1) tablet 100 mg  100 mg Oral Daily Lance Coon, MD   100 mg at 07/05/15 2836   Or  . thiamine (B-1) injection 100 mg  100 mg Intravenous Daily Lance Coon, MD        Musculoskeletal: Strength & Muscle Tone: decreased Gait & Station: unable to stand Patient leans: N/A  Psychiatric Specialty Exam: Review of Systems  Constitutional: Positive for malaise/fatigue.  HENT: Negative.   Eyes: Negative.   Respiratory: Positive for shortness of breath.   Cardiovascular: Negative.   Gastrointestinal: Positive for nausea and  abdominal pain.  Musculoskeletal: Negative.   Skin: Negative.   Neurological: Negative.   Psychiatric/Behavioral: Positive for hallucinations and substance abuse. Negative for depression, suicidal ideas and memory loss. The patient is nervous/anxious. The patient does  not have insomnia.     Blood pressure 146/78, pulse 81, temperature 97.7 F (36.5 C), temperature source Oral, resp. rate 18, height 5' 8"  (1.727 m), weight 143.019 kg (315 lb 4.8 oz), SpO2 94 %.Body mass index is 47.95 kg/(m^2).  General Appearance: Casual  Eye Contact::  Good  Speech:  Slow  Volume:  Decreased  Mood:  Euthymic  Affect:  Constricted  Thought Process:  Intact  Orientation:  Full (Time, Place, and Person)  Thought Content:  Hallucinations: Auditory  Suicidal Thoughts:  No  Homicidal Thoughts:  No  Memory:  Immediate;   Good Recent;   Fair Remote;   Fair  Judgement:  Intact  Insight:  Fair  Psychomotor Activity:  Decreased  Concentration:  Fair  Recall:  [employer] of Knowledge:Fair  Language: Fair  Akathisia:  Negative  Handed:  Right  AIMS (if indicated):     Assets:  Communication Skills Desire for Improvement Financial Resources/Insurance Housing Intimacy Resilience Social Support  ADL's:  Intact  Cognition: WNL  Sleep:      Treatment Plan Summary: Daily contact with patient to assess and evaluate symptoms and progress in treatment, Medication management and Plan patient with history of schizophrenia and alcohol abuse. He seems to be going through alcohol withdrawal with minimal complication. He is little bit shaky but no sign of delirium. This can probably be managed conservatively. I have tried to keep him restarted on his Zyprexa in the Zide's form to make it easier for him to swallow. 15 mg at night for his schizophrenia. Supportive counseling done. He can probably come off of commitment tomorrow I will follow-up with that. No other change to treatment plan for now.  Disposition: No  evidence of imminent risk to self or others at present.   Patient does not meet criteria for psychiatric inpatient admission. Supportive therapy provided about ongoing stressors. Discussed crisis plan, support from social network, calling 911, coming to the Emergency Department, and calling Suicide Hotline.  John [name] 07/05/2015 6:33 PM

## 2015-07-06 ENCOUNTER — Inpatient Hospital Stay: Payer: Medicare Other

## 2015-07-06 LAB — URINALYSIS COMPLETE WITH MICROSCOPIC (ARMC ONLY)
BACTERIA UA: NONE SEEN
Glucose, UA: NEGATIVE mg/dL
Leukocytes, UA: NEGATIVE
Nitrite: NEGATIVE
Protein, ur: NEGATIVE mg/dL
SPECIFIC GRAVITY, URINE: 1.029 (ref 1.005–1.030)
pH: 5 (ref 5.0–8.0)

## 2015-07-06 LAB — MAGNESIUM: MAGNESIUM: 1.4 mg/dL — AB (ref 1.7–2.4)

## 2015-07-06 LAB — COMPREHENSIVE METABOLIC PANEL
ALBUMIN: 2.6 g/dL — AB (ref 3.5–5.0)
ALK PHOS: 124 U/L (ref 38–126)
ALT: 46 U/L (ref 17–63)
AST: 185 U/L — ABNORMAL HIGH (ref 15–41)
Anion gap: 8 (ref 5–15)
BUN: 16 mg/dL (ref 6–20)
CALCIUM: 8 mg/dL — AB (ref 8.9–10.3)
CO2: 36 mmol/L — AB (ref 22–32)
CREATININE: 1.18 mg/dL (ref 0.61–1.24)
Chloride: 96 mmol/L — ABNORMAL LOW (ref 101–111)
GFR calc Af Amer: >60 mL/min (ref >60)
GFR calc non Af Amer: >60 mL/min (ref >60)
GLUCOSE: 72 mg/dL (ref 65–99)
Potassium: 3.5 mmol/L (ref 3.5–5.1)
SODIUM: 140 mmol/L (ref 135–145)
Total Bilirubin: 5.2 mg/dL — ABNORMAL HIGH (ref 0.3–1.2)
Total Protein: 7.1 g/dL (ref 6.5–8.1)

## 2015-07-06 LAB — CBC
HCT: 38.3 % — ABNORMAL LOW (ref 40.0–52.0)
HEMOGLOBIN: 12.6 g/dL — AB (ref 13.0–18.0)
MCH: 32.5 pg (ref 26.0–34.0)
MCHC: 32.9 g/dL (ref 32.0–36.0)
MCV: 98.7 fL (ref 80.0–100.0)
Platelets: 70 10*3/uL — ABNORMAL LOW (ref 150–440)
RBC: 3.88 MIL/uL — AB (ref 4.40–5.90)
RDW: 18.5 % — ABNORMAL HIGH (ref 11.5–14.5)
WBC: 8.5 10*3/uL (ref 3.8–10.6)

## 2015-07-06 LAB — PATHOLOGIST SMEAR REVIEW

## 2015-07-06 LAB — GLUCOSE, CAPILLARY
GLUCOSE-CAPILLARY: 78 mg/dL (ref 65–99)
Glucose-Capillary: 73 mg/dL (ref 65–99)
Glucose-Capillary: 107 mg/dL — ABNORMAL HIGH (ref 65–99)
Glucose-Capillary: 92 mg/dL (ref 65–99)

## 2015-07-06 LAB — CERULOPLASMIN: CERULOPLASMIN: 29.5 mg/dL (ref 16.0–31.0)

## 2015-07-06 LAB — ALPHA-1 ANTITRYPSIN PHENOTYPE: A-1 Antitrypsin, Ser: 218 mg/dL — ABNORMAL HIGH (ref 90–200)

## 2015-07-06 LAB — AFP TUMOR MARKER: AFP-Tumor Marker: 2.5 ng/mL (ref 0.0–8.3)

## 2015-07-06 LAB — TRANSFERRIN: Transferrin: 165 mg/dL — ABNORMAL LOW (ref 180–329)

## 2015-07-06 MED ORDER — DEXTROSE 5 % IV SOLN
2.0000 g | INTRAVENOUS | Status: DC
  Administered 2015-07-06 – 2015-07-07 (×2): 2 g via INTRAVENOUS
  Filled 2015-07-06 (×3): qty 2

## 2015-07-06 MED ORDER — MAGNESIUM SULFATE 2 GM/50ML IV SOLN
2.0000 g | Freq: Once | INTRAVENOUS | Status: AC
  Administered 2015-07-06: 17:00:00 2 g via INTRAVENOUS
  Filled 2015-07-06: qty 50

## 2015-07-06 MED ORDER — POTASSIUM CHLORIDE CRYS ER 20 MEQ PO TBCR
40.0000 meq | EXTENDED_RELEASE_TABLET | Freq: Once | ORAL | Status: AC
  Administered 2015-07-06: 14:00:00 40 meq via ORAL
  Filled 2015-07-06: qty 2

## 2015-07-06 MED ORDER — DEXTROSE-NACL 5-0.45 % IV SOLN
INTRAVENOUS | Status: DC
  Administered 2015-07-06: 09:00:00 via INTRAVENOUS

## 2015-07-06 MED ORDER — FUROSEMIDE 20 MG PO TABS
20.0000 mg | ORAL_TABLET | Freq: Two times a day (BID) | ORAL | Status: DC
  Administered 2015-07-07 – 2015-07-08 (×3): 20 mg via ORAL
  Filled 2015-07-06 (×3): qty 1

## 2015-07-06 MED ORDER — SPIRONOLACTONE 25 MG PO TABS: 50.0000 mg | ORAL_TABLET | Freq: Every day | ORAL | Status: DC

## 2015-07-06 MED ORDER — LACTULOSE 10 GM/15ML PO SOLN
30.0000 g | Freq: Two times a day (BID) | ORAL | Status: DC
  Administered 2015-07-06 – 2015-07-07 (×4): 30 g via ORAL
  Filled 2015-07-06 (×5): qty 60

## 2015-07-06 NOTE — Progress Notes (Signed)
Patient had no c/o pain this shift VSS Patient is a good mood today no suicidal thoughts or intentions verbalized  Patient had abdominal US and tolerated procedure well Sitter at bedside was discontinued by Dr Toni Amendlapacs in the afternoon  Family at bedside  IVC discontinued by Dr Roselyn BeringKlapaccs

## 2015-07-06 NOTE — Progress Notes (Signed)
Per P&T Committee, the dose of cefTRIAXone should be increased to high dose - 2 gm IV q 24 hours for the following conditions:  Bacteremia  Endocarditis  Intra-abdominal infection  Osteomyelitis  Per protocol the dose of ceftriaxone was increased to 2g q 24 hours due to treating an intra-abdominal infection Melissa D Maccia, Pharm.D Clinical Pharmacist

## 2015-07-06 NOTE — Consult Note (Signed)
[place] [facility] Consult   Reason for Consult:  Follow-up note for this 34 year old man with schizophrenia and alcohol abuse came into the hospital with what is turning out to be acute cirrhosis Referring Physician:  Tressia Miners Patient Identification: Howard [name] MRN:  [id number] Principal Diagnosis: Hypoxia Diagnosis:   Patient Active Problem List   Diagnosis Date Noted  . Hypoxia [R09.02] 07/04/2015  . Ascites [R18.8] 07/04/2015  . Elevated transaminase level [R74.0] 07/04/2015  . DOE (dyspnea on exertion) [R06.09] 07/04/2015  . Alcohol abuse [F10.10] 05/31/2015  . Hypertension [I10] 05/29/2015  . Paranoid schizophrenia ([place]) [F20.0] 03/20/2015    Total Time spent with patient: 30 minutes  Subjective:   Howard [name] is a 34 y.o. male patient admitted with "I'm doing good. Feeling good. No voices in 3 days".  HPI:  Follow-up for this patient was schizophrenia and alcohol abuse. He states that his mood is feeling better today. He does not feel depressed. He completely denies any wish to die or suicidal thoughts. He tells me he has not had any auditory hallucinations since coming into the hospital and he feels relieved by that. He is not feeling any sense of fear. Patient appears to be understanding and processing the information about his liver damage and his alcohol abuse. He is stating a desire to get back into serious treatment for his alcohol use. Patient has been cooperative with treatment no sign of agitation or dangerous behavior in the hospital. Tolerating medicine well.  Past Psychiatric History: Patient has a long history of schizophrenia which had been responsive to medication. He has been clinically worse since starting to drink heavily a couple years ago. Has been able to maintain sobriety when he was engaged in outpatient treatment. Positive history of hospitalizations and suicidality.  Risk to Self: Suicidal Ideation: No Suicidal Intent: No Is patient at risk  for suicide?: Yes Suicidal Plan?: No Access to Means: No What has been your use of drugs/alcohol within the last 12 months?: Vodka, pint everyday How many times?: 0 Other Self Harm Risks: 0 Triggers for Past Attempts: None known Intentional Self Injurious Behavior: None Risk to Others: Homicidal Ideation: No Thoughts of Harm to Others: No Current Homicidal Intent: No Current Homicidal Plan: No Access to Homicidal Means: No Identified Victim: N/A History of harm to others?: No Assessment of Violence: None Noted Violent Behavior Description: N/A Does patient have access to weapons?: No Criminal Charges Pending?: No Does patient have a court date: No Prior Inpatient Therapy: Prior Inpatient Therapy: Yes Prior Therapy Dates: April 2016 Prior Therapy Facilty/Provider(s): [facility] Reason for Treatment: Schizophrenia and Alcohol Prior Outpatient Therapy: Prior Outpatient Therapy: Yes Prior Therapy Dates: twice a month Prior Therapy Facilty/Provider(s): Dr. Weber Cooks Reason for Treatment: Schizophrenia and Alcohol Does patient have an ACCT team?: No Does patient have Intensive In-House Services?  : No Does patient have [facility] services? : No Does patient have P4CC services?: No  Past Medical History:  Past Medical History  Diagnosis Date  . Hypertension   . Schizophrenia ([place])   . Hyperlipemia   . Depression   . Diabetes [facility])     Past Surgical History  Procedure Laterality Date  . No past surgeries     Family History:  Family History  Problem Relation Age of Onset  . Heart disease Mother   . Hypertension Mother   . Hyperlipidemia Mother   . Stroke Father   . Heart attack Father   . Hypertension Father   . Heart  disease Brother    Family Psychiatric  History: Patient has a family history of some mental health problems. Social History:  History  Alcohol Use  . 3.0 oz/week  . 5 Shots of liquor, 0 Standard drinks or equivalent per week     History  Drug Use No     Social History   Social History  . Marital Status: Single    Spouse Name: N/A  . Number of Children: N/A  . Years of Education: N/A   Social History Main Topics  . Smoking status: Never Smoker   . Smokeless tobacco: Never Used  . Alcohol Use: 3.0 oz/week    5 Shots of liquor, 0 Standard drinks or equivalent per week  . Drug Use: No  . Sexual Activity: No   Other Topics Concern  . None   Social History Narrative   Additional Social History:    History of alcohol / drug use?: Yes Longest period of sobriety (when/how long): "not sure" Name of Substance 1: vodka 1 - Age of First Use: 22 1 - Amount (size/oz): 1 pint 1 - Frequency: every day 1 - Duration: all day 1 - Last Use / Amount: 07/03/2015                   Allergies:  No Known Allergies  Labs:  Results for orders placed or performed during the hospital encounter of 07/04/15 (from the past 48 hour(s))  Comprehensive metabolic panel     Status: Abnormal   Collection Time: 07/04/15  4:47 PM  Result Value Ref Range   Sodium 139 135 - 145 mmol/L   Potassium 3.2 (L) 3.5 - 5.1 mmol/L   Chloride 93 (L) 101 - 111 mmol/L   CO2 35 (H) 22 - 32 mmol/L   Glucose, Bld 112 (H) 65 - 99 mg/dL   BUN 8 6 - 20 mg/dL   Creatinine, Ser 0.69 0.61 - 1.24 mg/dL   Calcium 8.0 (L) 8.9 - 10.3 mg/dL   Total Protein 8.3 (H) 6.5 - 8.1 g/dL   Albumin 3.0 (L) 3.5 - 5.0 g/dL   AST 233 (H) 15 - 41 U/L   ALT 60 17 - 63 U/L   Alkaline Phosphatase 157 (H) 38 - 126 U/L   Total Bilirubin 3.1 (H) 0.3 - 1.2 mg/dL   GFR calc non Af Amer >60 >60 mL/min   GFR calc Af Amer >60 >60 mL/min    Comment: (NOTE) The eGFR has been calculated using the CKD EPI equation. This calculation has not been validated in all clinical situations. eGFR's persistently <60 mL/min signify possible Chronic Kidney Disease.    Anion gap 11 5 - 15  Troponin I     Status: None   Collection Time: 07/04/15  4:47 PM  Result Value Ref Range   Troponin I <0.03 <0.031  ng/mL    Comment:        NO INDICATION OF MYOCARDIAL INJURY.   CBC with Differential     Status: Abnormal   Collection Time: 07/04/15  6:03 PM  Result Value Ref Range   WBC 11.2 (H) 3.8 - 10.6 K/uL   RBC 4.54 4.40 - 5.90 MIL/uL   Hemoglobin 14.2 13.0 - 18.0 g/dL   HCT 44.0 40.0 - 52.0 %   MCV 96.9 80.0 - 100.0 fL   MCH 31.2 26.0 - 34.0 pg   MCHC 32.2 32.0 - 36.0 g/dL   RDW 17.9 (H) 11.5 - 14.5 %   Platelets 113 (  L) 150 - 440 K/uL   Neutrophils Relative % 76 %   Neutro Abs 8.6 (H) 1.4 - 6.5 K/uL   Lymphocytes Relative 13 %   Lymphs Abs 1.4 1.0 - 3.6 K/uL   Monocytes Relative 9 %   Monocytes Absolute 1.0 0.2 - 1.0 K/uL   Eosinophils Relative 1 %   Eosinophils Absolute 0.1 0 - 0.7 K/uL   Basophils Relative 1 %   Basophils Absolute 0.1 0 - 0.1 K/uL  APTT     Status: None   Collection Time: 07/04/15  6:30 PM  Result Value Ref Range   aPTT 36 24 - 36 seconds  Protime-INR     Status: Abnormal   Collection Time: 07/04/15  6:30 PM  Result Value Ref Range   Prothrombin Time 16.9 (H) 11.4 - 15.0 seconds   INR 1.35   Acetaminophen level     Status: Abnormal   Collection Time: 07/04/15  6:30 PM  Result Value Ref Range   Acetaminophen (Tylenol), Serum <10 (L) 10 - 30 ug/mL    Comment:        THERAPEUTIC CONCENTRATIONS VARY SIGNIFICANTLY. A RANGE OF 10-30 ug/mL MAY BE AN EFFECTIVE CONCENTRATION FOR MANY PATIENTS. HOWEVER, SOME ARE BEST TREATED AT CONCENTRATIONS OUTSIDE THIS RANGE. ACETAMINOPHEN CONCENTRATIONS >150 ug/mL AT 4 HOURS AFTER INGESTION AND >50 ug/mL AT 12 HOURS AFTER INGESTION ARE OFTEN ASSOCIATED WITH TOXIC REACTIONS.   Salicylate level     Status: None   Collection Time: 07/04/15  6:30 PM  Result Value Ref Range   Salicylate Lvl <2.8 2.8 - 30.0 mg/dL  Ethanol     Status: Abnormal   Collection Time: 07/04/15  6:30 PM  Result Value Ref Range   Alcohol, Ethyl (B) 170 (H) <5 mg/dL    Comment:        LOWEST DETECTABLE LIMIT FOR SERUM ALCOHOL IS 5 mg/dL FOR  MEDICAL PURPOSES ONLY   Body fluid culture     Status: None (Preliminary result)   Collection Time: 07/04/15  8:41 PM  Result Value Ref Range   Specimen Description ABDOMEN    Special Requests NONE    Gram Stain RARE WBC SEEN NO ORGANISMS SEEN     Culture NO GROWTH 1 DAY    Report Status PENDING   Glucose, pleural or peritoneal fluid     Status: None   Collection Time: 07/04/15  8:41 PM  Result Value Ref Range   Glucose, Fluid 115 mg/dL    Comment: (NOTE) No normal range established for this test Results should be evaluated in conjunction with serum values    Fluid Type-FGLU PERITONEAL CAVITY   Body fluid cell count with differential     Status: Abnormal   Collection Time: 07/04/15  8:41 PM  Result Value Ref Range   Fluid Type-FCT FLUID     Comment: CORRECTED ON 10/12 AT 2312: PREVIOUSLY REPORTED AS Body Fluid   Color, Fluid YELLOW YELLOW   Appearance, Fluid CLEAR (A) CLEAR   WBC, Fluid 117 cu mm   Neutrophil Count, Fluid 10 %   Lymphs, Fluid 31 %   Monocyte-Macrophage-Serous Fluid 59 %  Pathologist smear review     Status: None   Collection Time: 07/04/15  8:41 PM  Result Value Ref Range   Path Review      Cytospin slide of peritoneal fluid reviewed. Low cellularity sample with mostly macrophages and a few mesothelial cells. No malignant cells are identified.     Comment: Reviewed by Lemmie Evens.  Dicie Beam, MD.  Protein, fluid - pleural or peritoneal     Status: None   Collection Time: 07/04/15 10:41 PM  Result Value Ref Range   Total protein, fluid <3.0 g/dL    Comment: (NOTE) No normal range established for this test Results should be evaluated in conjunction with serum values    Fluid Type-FTP PERITONEAL CAVITY   Lactate dehydrogenase (CSF, pleural or peritoneal fluid)     Status: Abnormal   Collection Time: 07/04/15 10:41 PM  Result Value Ref Range   LD, Fluid 56 (H) 3 - 23 U/L    Comment: (NOTE) Results should be evaluated in conjunction with serum values    Fluid  Type-FLDH CSF     Comment: CORRECTED ON 10/12 AT 2247: PREVIOUSLY REPORTED AS Peritoneal  CBC     Status: Abnormal   Collection Time: 07/05/15  1:26 AM  Result Value Ref Range   WBC 10.2 3.8 - 10.6 K/uL   RBC 4.19 (L) 4.40 - 5.90 MIL/uL   Hemoglobin 13.5 13.0 - 18.0 g/dL   HCT 40.8 40.0 - 52.0 %   MCV 97.4 80.0 - 100.0 fL   MCH 32.3 26.0 - 34.0 pg   MCHC 33.2 32.0 - 36.0 g/dL   RDW 18.1 (H) 11.5 - 14.5 %   Platelets 82 (L) 150 - 440 K/uL  Creatinine, serum     Status: None   Collection Time: 07/05/15  1:26 AM  Result Value Ref Range   Creatinine, Ser 0.70 0.61 - 1.24 mg/dL   GFR calc non Af Amer >60 >60 mL/min   GFR calc Af Amer >60 >60 mL/min    Comment: (NOTE) The eGFR has been calculated using the CKD EPI equation. This calculation has not been validated in all clinical situations. eGFR's persistently <60 mL/min signify possible Chronic Kidney Disease.   Basic metabolic panel     Status: Abnormal   Collection Time: 07/05/15  5:32 AM  Result Value Ref Range   Sodium 138 135 - 145 mmol/L   Potassium 2.9 (LL) 3.5 - 5.1 mmol/L    Comment: CRITICAL RESULT CALLED TO, READ BACK BY AND VERIFIED WITH TRACY [email or web address]  ON 07/05/15 BY HP    Chloride 92 (L) 101 - 111 mmol/L   CO2 36 (H) 22 - 32 mmol/L   Glucose, Bld 69 65 - 99 mg/dL   BUN 10 6 - 20 mg/dL   Creatinine, Ser 0.74 0.61 - 1.24 mg/dL   Calcium 7.7 (L) 8.9 - 10.3 mg/dL   GFR calc non Af Amer >60 >60 mL/min   GFR calc Af Amer >60 >60 mL/min    Comment: (NOTE) The eGFR has been calculated using the CKD EPI equation. This calculation has not been validated in all clinical situations. eGFR's persistently <60 mL/min signify possible Chronic Kidney Disease.    Anion gap 10 5 - 15  CBC     Status: Abnormal   Collection Time: 07/05/15  5:32 AM  Result Value Ref Range   WBC 9.3 3.8 - 10.6 K/uL   RBC 4.01 (L) 4.40 - 5.90 MIL/uL   Hemoglobin 13.2 13.0 - 18.0 g/dL   HCT 39.4 (L) 40.0 - 52.0 %   MCV 98.3 80.0 - 100.0 fL    MCH 32.9 26.0 - 34.0 pg   MCHC 33.5 32.0 - 36.0 g/dL   RDW 18.3 (H) 11.5 - 14.5 %   Platelets 68 (L) 150 - 440 K/uL  Brain natriuretic peptide     Status: None  Collection Time: 07/05/15  5:32 AM  Result Value Ref Range   B Natriuretic Peptide 21.0 0.0 - 100.0 pg/mL  Glucose, capillary     Status: None   Collection Time: 07/05/15  7:14 AM  Result Value Ref Range   Glucose-Capillary 71 65 - 99 mg/dL  Glucose, capillary     Status: Abnormal   Collection Time: 07/05/15 11:37 AM  Result Value Ref Range   Glucose-Capillary 104 (H) 65 - 99 mg/dL  Alpha-1 antitrypsin phenotype     Status: Abnormal   Collection Time: 07/05/15  3:59 PM  Result Value Ref Range   A-1 Antitrypsin Pheno MM     Comment: (NOTE)       Phenotype   Population      A-1-AT Concentration                   Incidence %      Reference Interval       MM            86.5%                96 - 189       MS             8.0%                83 - 161       MZ             3.9%                60 - 111       FM             0.4%                93 - 191       SZ             0.3%                42 -  75       SS             0.1%                62 - 119       ZZ             0.05%               16 -  38       FS             0.05%               70 - 128       FZ            Unknown              44 -  88       FF            Unknown              Unknown Performed At: [facility] [place], [place] [id number] Lindon Romp MD [id number]    A-1 Antitrypsin, Ser 218 (H) 90 - 200 mg/dL  AFP tumor marker     Status: None   Collection Time: 07/05/15  3:59 PM  Result Value Ref Range   AFP-Tumor Marker 2.5 0.0 - 8.3 ng/mL    Comment: (NOTE) Roche ECLIA methodology  Performed At: [facility] [place], [place] [id number] Lindon Romp MD [id number]   Ferritin     Status: None   Collection Time: 07/05/15  3:59 PM  Result Value Ref Range   Ferritin 110 24 - 336 ng/mL  Transferrin      Status: Abnormal   Collection Time: 07/05/15  3:59 PM  Result Value Ref Range   Transferrin 165 (L) 180 - 329 mg/dL    Comment: Performed at [facility]  Ceruloplasmin     Status: None   Collection Time: 07/05/15  3:59 PM  Result Value Ref Range   Ceruloplasmin 29.5 16.0 - 31.0 mg/dL    Comment: (NOTE) Performed At: [facility] [place], [place] [id number] Lindon Romp MD [id number]   Ammonia     Status: Abnormal   Collection Time: 07/05/15  3:59 PM  Result Value Ref Range   Ammonia 89 (H) 9 - 35 umol/L  Basic metabolic panel     Status: Abnormal   Collection Time: 07/05/15  3:59 PM  Result Value Ref Range   Sodium 137 135 - 145 mmol/L   Potassium 3.9 3.5 - 5.1 mmol/L   Chloride 94 (L) 101 - 111 mmol/L   CO2 36 (H) 22 - 32 mmol/L   Glucose, Bld 103 (H) 65 - 99 mg/dL   BUN 11 6 - 20 mg/dL   Creatinine, Ser 0.75 0.61 - 1.24 mg/dL   Calcium 7.8 (L) 8.9 - 10.3 mg/dL   GFR calc non Af Amer >60 >60 mL/min   GFR calc Af Amer >60 >60 mL/min    Comment: (NOTE) The eGFR has been calculated using the CKD EPI equation. This calculation has not been validated in all clinical situations. eGFR's persistently <60 mL/min signify possible Chronic Kidney Disease.    Anion gap 7 5 - 15  Glucose, capillary     Status: None   Collection Time: 07/05/15  4:03 PM  Result Value Ref Range   Glucose-Capillary 93 65 - 99 mg/dL  Glucose, capillary     Status: None   Collection Time: 07/05/15  9:42 PM  Result Value Ref Range   Glucose-Capillary 74 65 - 99 mg/dL  Magnesium     Status: Abnormal   Collection Time: 07/06/15  5:29 AM  Result Value Ref Range   Magnesium 1.4 (L) 1.7 - 2.4 mg/dL  CBC     Status: Abnormal   Collection Time: 07/06/15  5:29 AM  Result Value Ref Range   WBC 8.5 3.8 - 10.6 K/uL   RBC 3.88 (L) 4.40 - 5.90 MIL/uL   Hemoglobin 12.6 (L) 13.0 - 18.0 g/dL   HCT 38.3 (L) 40.0 - 52.0 %   MCV 98.7 80.0 - 100.0 fL   MCH 32.5 26.0 - 34.0 pg    MCHC 32.9 32.0 - 36.0 g/dL   RDW 18.5 (H) 11.5 - 14.5 %   Platelets 70 (L) 150 - 440 K/uL  Comprehensive metabolic panel     Status: Abnormal   Collection Time: 07/06/15  5:29 AM  Result Value Ref Range   Sodium 140 135 - 145 mmol/L   Potassium 3.5 3.5 - 5.1 mmol/L   Chloride 96 (L) 101 - 111 mmol/L   CO2 36 (H) 22 - 32 mmol/L   Glucose, Bld 72 65 - 99 mg/dL   BUN 16 6 - 20 mg/dL   Creatinine, Ser 1.18 0.61 - 1.24 mg/dL   Calcium 8.0 (L) 8.9 -  10.3 mg/dL   Total Protein 7.1 6.5 - 8.1 g/dL   Albumin 2.6 (L) 3.5 - 5.0 g/dL   AST 185 (H) 15 - 41 U/L   ALT 46 17 - 63 U/L   Alkaline Phosphatase 124 38 - 126 U/L   Total Bilirubin 5.2 (H) 0.3 - 1.2 mg/dL   GFR calc non Af Amer >60 >60 mL/min   GFR calc Af Amer >60 >60 mL/min    Comment: (NOTE) The eGFR has been calculated using the CKD EPI equation. This calculation has not been validated in all clinical situations. eGFR's persistently <60 mL/min signify possible Chronic Kidney Disease.    Anion gap 8 5 - 15  Glucose, capillary     Status: None   Collection Time: 07/06/15  7:27 AM  Result Value Ref Range   Glucose-Capillary 73 65 - 99 mg/dL  Urinalysis complete, with microscopic ([facility] only)     Status: Abnormal   Collection Time: 07/06/15  1:57 PM  Result Value Ref Range   Color, Urine AMBER (A) YELLOW   APPearance CLEAR (A) CLEAR   Glucose, UA NEGATIVE NEGATIVE mg/dL   Bilirubin Urine 1+ (A) NEGATIVE   Ketones, ur TRACE (A) NEGATIVE mg/dL   Specific Gravity, Urine 1.029 1.005 - 1.030   Hgb urine dipstick 2+ (A) NEGATIVE   pH 5.0 5.0 - 8.0   Protein, ur NEGATIVE NEGATIVE mg/dL   Nitrite NEGATIVE NEGATIVE   Leukocytes, UA NEGATIVE NEGATIVE   RBC / HPF 6-30 0 - 5 RBC/hpf   WBC, UA 0-5 0 - 5 WBC/hpf   Bacteria, UA NONE SEEN NONE SEEN   Squamous Epithelial / LPF 0-5 (A) NONE SEEN   Mucous PRESENT    Hyaline Casts, UA PRESENT     Current Facility-Administered Medications  Medication Dose Route Frequency Provider Last  Rate Last Dose  . acetaminophen (TYLENOL) tablet 650 mg  650 mg Oral Q6H PRN Lance Coon, MD      . albuterol (PROVENTIL) (2.5 MG/3ML) 0.083% nebulizer solution 3 mL  3 mL Inhalation Q6H PRN Lance Coon, MD      . antiseptic oral rinse (CPC / CETYLPYRIDINIUM CHLORIDE 0.05%) solution 7 mL  7 mL Mouth Rinse BID Gladstone Lighter, MD   7 mL at 07/05/15 2142  . cefTRIAXone (ROCEPHIN) 2 g in dextrose 5 % 50 mL IVPB  2 g Intravenous Q24H Gladstone Lighter, MD   2 g at 07/06/15 1428  . citalopram (CELEXA) tablet 20 mg  20 mg Oral Daily Gladstone Lighter, MD   20 mg at 07/06/15 1419  . folic acid (FOLVITE) tablet 1 mg  1 mg Oral Daily Lance Coon, MD   1 mg at 07/06/15 1419  . [START ON 07/07/2015] furosemide (LASIX) tablet 20 mg  20 mg Oral BID Gladstone Lighter, MD      . insulin aspart (novoLOG) injection 0-9 Units  0-9 Units Subcutaneous TID WC Lance Coon, MD   0 Units at 07/05/15 0820  . lactulose (CHRONULAC) 10 GM/15ML solution 30 g  30 g Oral BID Gladstone Lighter, MD   30 g at 07/06/15 1420  . LORazepam (ATIVAN) tablet 1 mg  1 mg Oral Q6H PRN Lance Coon, MD       Or  . LORazepam (ATIVAN) injection 1 mg  1 mg Intravenous Q6H PRN Lance Coon, MD      . LORazepam (ATIVAN) tablet 0-4 mg  0-4 mg Oral Q6H Lance Coon, MD   1 mg at 07/05/15 2137  Followed by  . [START ON 07/07/2015] LORazepam (ATIVAN) tablet 0-4 mg  0-4 mg Oral Q12H Lance Coon, MD      . losartan (COZAAR) tablet 100 mg  100 mg Oral Daily Gladstone Lighter, MD   100 mg at 07/06/15 1419  . magnesium sulfate IVPB 2 g 50 mL  2 g Intravenous Once Gladstone Lighter, MD      . metFORMIN (GLUCOPHAGE) tablet 500 mg  500 mg Oral BID WC Lance Coon, MD   500 mg at 07/05/15 1610  . metoprolol succinate (TOPROL-XL) 24 hr tablet 100 mg  100 mg Oral Daily Lance Coon, MD   100 mg at 07/06/15 1418  . morphine 2 MG/ML injection 2 mg  2 mg Intravenous Q4H PRN Lance Coon, MD      . multivitamin with minerals tablet 1 tablet  1 tablet Oral  Q supper Lance Coon, MD   1 tablet at 07/05/15 1610  . OLANZapine zydis (ZYPREXA) disintegrating tablet 15 mg  15 mg Oral QHS Gonzella Lex, MD   15 mg at 07/05/15 2148  . ondansetron (ZOFRAN) injection 4 mg  4 mg Intravenous Q6H PRN Lance Coon, MD      . pantoprazole (PROTONIX) EC tablet 40 mg  40 mg Oral QAC breakfast Lance Coon, MD   40 mg at 07/05/15 7001  . sodium chloride 0.9 % injection 3 mL  3 mL Intravenous Q12H Lance Coon, MD   3 mL at 07/05/15 2147  . [START ON 07/07/2015] spironolactone (ALDACTONE) tablet 50 mg  50 mg Oral Daily Gladstone Lighter, MD      . thiamine (VITAMIN B-1) tablet 100 mg  100 mg Oral Daily Lance Coon, MD   100 mg at 07/06/15 1418    Musculoskeletal: Strength & Muscle Tone: decreased Gait & Station: broad based Patient leans: Backward  Psychiatric Specialty Exam: Review of Systems  Constitutional: Positive for malaise/fatigue.  HENT: Negative.   Eyes: Negative.   Respiratory: Negative.   Cardiovascular: Negative.   Gastrointestinal: Negative.   Musculoskeletal: Negative.   Skin: Negative.   Neurological: Negative.   Psychiatric/Behavioral: Positive for substance abuse. Negative for depression, suicidal ideas, hallucinations and memory loss. The patient is not nervous/anxious and does not have insomnia.     Blood pressure 157/92, pulse 90, temperature 98.2 F (36.8 C), temperature source Oral, resp. rate 18, height 5' 8"  (1.727 m), weight 144.97 kg (319 lb 9.6 oz), SpO2 95 %.Body mass index is 48.61 kg/(m^2).  General Appearance: Fairly Groomed  Engineer, water::  Good  Speech:  Normal Rate  Volume:  Decreased  Mood:  Euthymic  Affect:  Congruent  Thought Process:  Linear  Orientation:  Full (Time, Place, and Person)  Thought Content:  Negative  Suicidal Thoughts:  No  Homicidal Thoughts:  No  Memory:  Immediate;   Good Recent;   Good Remote;   Fair  Judgement:  Fair  Insight:  Fair  Psychomotor Activity:  Decreased  Concentration:   Fair  Recall:  [employer] of Knowledge:Fair  Language: Fair  Akathisia:  No  Handed:  Right  AIMS (if indicated):     Assets:  Communication Skills Desire for Improvement Financial Resources/Insurance Housing Social Support  ADL's:  Intact  Cognition: WNL  Sleep:      Treatment Plan Summary: Daily contact with patient to assess and evaluate symptoms and progress in treatment, Medication management and Plan Patient appears to be improving. As far as his schizophrenia his symptoms of hallucinations  and paranoia are much improved. He is tolerating the current Zyprexa. I would continue the current dose of medication. Supportive counseling done with the patient and he is reminded to notify nurses if he gets frightened or has any return of psychotic symptoms which she agrees to do. As far as his alcohol abuse he does not appear to be having delirium tremens. He is not having major alcohol withdrawal problems. 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 Psychoeducation completed and we are working on trying to get him back into intensive outpatient after discharge. I have discontinued his involuntary commitment paperwork and discontinued his sitter. He no longer requires commitment. If he needs a psychiatrist over the weekend please request the doctor on call otherwise I will follow-up on Monday.  Disposition: No evidence of imminent risk to self or others at present.   Patient does not meet criteria for psychiatric inpatient admission. Supportive therapy provided about ongoing stressors. Refer to IOP. Discussed crisis plan, support from social network, calling 911, coming to the Emergency Department, and calling Suicide Hotline.  John [name] 07/06/2015 4:35 PM

## 2015-07-06 NOTE — Progress Notes (Signed)
[facility] - [place] at [facility]   PATIENT NAME: Howard [name]    MR#:  [id number]  DATE OF BIRTH:  12/18/1980  SUBJECTIVE:  CHIEF COMPLAINT:   Chief Complaint  Patient presents with  . Shortness of Breath   -Remains on 4-5 L oxygen. Breathing is much better today. -Ultrasound confirms liver cirrhosis. Epistaxis episode today,  on Lovenox  REVIEW OF SYSTEMS:  Review of Systems  Constitutional: Positive for malaise/fatigue. Negative for fever and chills.  HENT: Negative for congestion and nosebleeds.   Respiratory: Negative for cough, shortness of breath and wheezing.   Cardiovascular: Negative for chest pain and palpitations.  Gastrointestinal: Positive for abdominal pain. Negative for nausea, vomiting, diarrhea and constipation.  Genitourinary: Negative for dysuria and hematuria.  Musculoskeletal: Positive for myalgias.  Neurological: Positive for weakness. Negative for dizziness, sensory change, speech change, focal weakness, seizures and headaches.    DRUG ALLERGIES:  No Known Allergies  VITALS:  Blood pressure 157/92, pulse 90, temperature 98.2 F (36.8 C), temperature source Oral, resp. rate 18, height  (1.727 m), weight 144.97 kg (319 lb 9.6 oz), SpO2 95 %.  PHYSICAL EXAMINATION:  Physical Exam  GENERAL:  34 y.o.-year-old patient lying in the bed with no acute distress.  EYES: Pupils equal, round, reactive to light and accommodation. No scleral icterus. Extraocular muscles intact.  HEENT: Head atraumatic, normocephalic. Oropharynx and nasopharynx clear. Dried blood in the Mnire's noted. No active bleeding at this time NECK:  Supple, no jugular venous distention. No thyroid enlargement, no tenderness.  LUNGS: Normal breath sounds bilaterally, no wheezing, rales,rhonchi or crepitation. No use of accessory muscles of respiration. Decreased bibasilar breath sounds, much decreased at the right base CARDIOVASCULAR: S1, S2 normal. No murmurs,  rubs, or gallops.  ABDOMEN: Soft, nontender, abdomen is distended, no fluid thrill. Bowel sounds present. No organomegaly or mass.  EXTREMITIES: No  cyanosis, or clubbing. 1+ pedal edema present NEUROLOGIC: Cranial nerves II through XII are intact. Muscle strength 5/5 in all extremities. Sensation intact. Gait not checked.  PSYCHIATRIC: The patient is alert and oriented x 3.  SKIN: No obvious rash, lesion, or ulcer.    LABORATORY PANEL:   CBC  Recent Labs Lab 07/06/15 0529  WBC 8.5  HGB 12.6*  HCT 38.3*  PLT 70*   ------------------------------------------------------------------------------------------------------------------  Chemistries   Recent Labs Lab 07/06/15 0529  NA 140  K 3.5  CL 96*  CO2 36*  GLUCOSE 72  BUN 16  CREATININE 1.18  CALCIUM 8.0*  MG 1.4*  AST 185*  ALT 46  ALKPHOS 124  BILITOT 5.2*   ------------------------------------------------------------------------------------------------------------------  Cardiac Enzymes  Recent Labs Lab 07/04/15 1647  TROPONINI <0.03   ------------------------------------------------------------------------------------------------------------------  RADIOLOGY:  Dg Chest 2 View  07/04/2015  CLINICAL DATA:  Midsternal chest pain, chronic.  Hypertension. EXAM: CHEST  2 VIEW COMPARISON:  July 23, 2014 FINDINGS: There is slight atelectasis in lung bases. The lungs are otherwise clear. Heart size and pulmonary vascularity are normal. No pneumothorax. No adenopathy. No bone lesions. IMPRESSION: Slight bibasilar atelectasis.  No edema or consolidation. Electronically Signed   By: Bretta Bang III M.D.   On: 07/04/2015 17:07   Ct Angio Chest Pe W/cm &/or Wo Cm  07/05/2015  CLINICAL DATA:  Shortness of breath/hypoxia EXAM: CT ANGIOGRAPHY CHEST WITH CONTRAST TECHNIQUE: Multidetector CT imaging of the chest was performed using the standard protocol during bolus administration of intravenous contrast. Multiplanar  CT image reconstructions and MIPs were obtained to evaluate the  vascular anatomy. CONTRAST:  100mL OMNIPAQUE IOHEXOL 350 MG/ML SOLN COMPARISON:  Chest radiograph July 04, 2015 FINDINGS: There is no demonstrable pulmonary embolus. There is no thoracic aortic aneurysm or dissection. The visualized great vessels appear unremarkable. There is a sizable free-flowing effusion on the right. There is consolidation in the right lower lobe with volume loss. There is patchy consolidation in the left lower lobe with atelectatic change intermingled in the left lower lobe and inferior lingula. There is a nodular lesion in the right lobe of the thyroid measuring 1.9 x 1.5 cm. There is no appreciable thoracic adenopathy. There is moderate mediastinal fat. Pericardium is not thickened. In the visualized upper abdomen, there is hepatic steatosis. Visualized spleen is enlarged measuring 19.9 by 11.8 cm. There are no blastic or lytic bone lesions. Review of the MIP images confirms the above findings. IMPRESSION: No demonstrable pulmonary embolus. Sizable right pleural effusion with airspace consolidation in both lower lobes. There is also patchy atelectasis in the left base and inferior lingula. No appreciable adenopathy. Nodular lesion in the right lobe of the thyroid. Consider further evaluation with thyroid ultrasound. If patient is clinically hyperthyroid, consider nuclear medicine thyroid uptake and scan. Splenomegaly.  Note that spleen is incompletely visualized. Hepatic steatosis. Electronically Signed   By: Bretta BangWilliam  Woodruff III M.D.   On: 07/05/2015 10:39   [place] Abdomen Limited  07/05/2015  CLINICAL DATA:  Evaluate for ascites. EXAM: LIMITED ABDOMEN ULTRASOUND FOR ASCITES TECHNIQUE: Limited ultrasound survey for ascites was performed in all four abdominal quadrants. COMPARISON:  Chest CT 07/05/2015 FINDINGS: There is a small amount of ascites within all 4 quadrants of the abdomen. IMPRESSION: Small amount of ascites.  Electronically Signed   By: Annia Beltrew  Davis M.D.   On: 07/05/2015 10:27   [place] Abdomen Limited Ruq  07/06/2015  CLINICAL DATA:  Hepatic cirrhosis. EXAM: US ABDOMEN LIMITED - RIGHT UPPER QUADRANT COMPARISON:  July 05, 2015. FINDINGS: Gallbladder: No gallstones or wall thickening visualized. No sonographic Murphy sign noted. Common bile duct: Diameter: 3.8 mm which is within normal limits. However, visualization is limited due to body habitus. Liver: Difficult to visualize due to body habitus. Increased echogenicity may be present suggesting fatty infiltration or diffuse hepatocellular disease. Focal lesion cannot be excluded on the basis of this exam. IMPRESSION: Exam is significantly limited due to body habitus. No definite evidence of gallstones or biliary dilatation is noted. Increased echogenicity of hepatic parenchyma is noted suggesting fatty infiltration or diffuse hepatocellular disease. Focal lesion cannot be excluded due to the limited imaging. Electronically Signed   By: Lupita RaiderJames  Green Jr, M.D.   On: 07/06/2015 11:51    EKG:   Orders placed or performed during the hospital encounter of 07/04/15  . ED EKG  . ED EKG  . EKG 12-Lead  . EKG 12-Lead    ASSESSMENT AND PLAN:   34 year old male with past medical history significant for hypertension, depression, schizoaffective disorder, significant alcohol abuse presents to the hospital secondary to abdominal distention, dyspnea neck session and weakness.  #1 abdominal distention-secondary to ascites, status post 4 L of ascites fluid removed by paracentesis in the emergency room. -Empirically started on Rocephin. No evidence of SBP on labs, but had a low-grade temperature of 100.41F today. 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 -Ultrasound of the abdomen shows splenomegaly and hepatic steatosis and possible cirrhosis. Labs with elevated AST, bilirubin elevated as well. Likely from recent alcohol abuse -GI consulted. -Check ammonia level-came  elevated. Started lactulose twice a day. Recheck tomorrow a.m.  #  2 acute hypoxic respiratory failure-hypoxic and tachypneic on admission. -CT of the chest negative for any PE. -Likely ascitic fluid compressing on the lungs, not improved after drainage. -Also has right-sided pleural effusion. Discontinue fluids  - on lasix, aldactone - Follow up chest x-ray tomorrow, if no improvement in oxygen requirements, can consider thoracentesis prior to discharge -Currently on 4 L oxygen, encourage incentive spirometry and wean oxygen as tolerated -Echo also ordered to rule out alcoholic cardiomyopathy  #3 hypertension-on losartan, - Lasix and Aldactone added. Continue to monitor. -Adjust doses based on blood pressure  #4 diabetes mellitus-on metformin. Add sliding scale insulin -HbA1c is 6.1  # 5 Depression and schizoaffective disorder- -patient stopped taking his medication as outpatient. He was having significant  auditory hallucinations. -Zyprexa restarted here after admission. Appreciate psych consult. -Still has the sitter. Likely will be discontinued after psych follow-up today. -No further hallucinations at this time.  #6 DVT Ted's and SCDs. -Lovenox discontinued due  to thrombocytopenia and also epistaxis  Physical therapy consult requested   All the records are reviewed and case discussed with Care Management/Social Workerr. Management plans discussed with the patient, family and they are in agreement.  CODE STATUS: Full Code  TOTAL TIME TAKING CARE OF THIS PATIENT: 45 minutes.   POSSIBLE D/C IN 2-3 DAYS, DEPENDING ON CLINICAL CONDITION.   Enid Baas M.D on 07/06/2015 at 3:19 PM  Between 7am to 6pm - Pager - [phone]  After 6pm go to [email or web address] - password [email or web address] [facility]  [facility]  Office  [phone]  CC: Primary care physician; Imelda Pillow, NP

## 2015-07-06 NOTE — Progress Notes (Signed)
PT Cancellation Note  Patient Details Name: Howard [name] MRN: [id number] DOB: 06/13/1981   Cancelled Treatment:    Reason Eval/Treat Not Completed: Other (comment). Pt currently out of room for imaging at this time. Will re-attempt, time permitting.   Laquandra Carrillo 07/06/2015, 11:45 AM  Elizabeth PalauStephanie Raman [place]atherston, PT, DPT [phone]

## 2015-07-06 NOTE — Plan of Care (Signed)
Problem: Consults Goal: Suicide Risk Patient Education (See Patient Education module for education specifics)  Outcome: Progressing Pt did not express any suicidal thoughts or ideation this shift. 1:1 sitter at bedside. Denies pain. O2 at 4 liters  with saturations around 92%.

## 2015-07-06 NOTE — Evaluation (Addendum)
Physical Therapy Evaluation Patient Details Name: Howard LinemanJoey A Ehrmann MRN: [id number] DOB: [birth year] Today's Date: 07/06/2015   History of Present Illness  Pt was admitted for hypoxia. Pt currently with liver cirrhosis. Pt with history of paranoid schizophrenia, HTN, and alcohol abuse. Pt currently with sitter present in room.  Clinical Impression  Pt is a pleasant 34 year old male who was admitted for hypoxia and acute liver cirrhosis. Pt A&O x 4. Pt performs bed mobility with modified independence ( only cues for impulsive behavior), transfers with supervision using BRW, and ambulation with cga and BRW +2. Pt with 1 instance of B LE buckling, however is able to self correct with help from therapist. Pt demonstrates deficits with balance/endurance/strength at this time. Pt requires O2 to be worn at all times and de-sats easily with exertion. Encouraged to increase activity with RN staff over weekend as well as HEP to improve functional mobility. Would benefit from skilled PT to address above deficits and promote optimal return to PLOF.      Follow Up Recommendations Outpatient PT (will need 24/hr supervision initially at home for safety)    Equipment Recommendations   (bariatric rolling walker)    Recommendations for Other Services       Precautions / Restrictions Precautions Precautions: None Restrictions Weight Bearing Restrictions: No      Mobility  Bed Mobility Overal bed mobility: Modified Independent                Transfers Overall transfer level: Needs assistance Equipment used:  (BRW) Transfers: Sit to/from Stand Sit to Stand: Supervision         General transfer comment: sit<>Stand with BRW and supervision. Safe technique performed.   Ambulation/Gait Ambulation/Gait assistance: Min guard Ambulation Distance (Feet): 80 Feet Assistive device:  (BRW) Gait Pattern/deviations: Step-through pattern   Gait velocity interpretation: Below normal speed for  age/gender General Gait Details: ambulated using BRW and cga. 1 instance of buckling in B knees, however pt was able to self recover with min assist. No further LOB noted. +2 during ambulation for safety. Pt becomes hypoxic with increased distance along with SOB symptoms. O2 sats decreased to 87% with exertion. All mobility performed on 4L of O2.  Stairs            Wheelchair Mobility    Modified Rankin (Stroke Patients Only)       Balance Overall balance assessment: Needs assistance;History of Falls Sitting-balance support: Feet supported Sitting balance-Leahy Scale: Normal     Standing balance support: Bilateral upper extremity supported Standing balance-Leahy Scale: Good                               Pertinent Vitals/Pain Pain Assessment: No/denies pain    Home Living Family/patient expects to be discharged to:: Private residence Living Arrangements: Parent Available Help at Discharge: Family Type of Home: House Home Access: Stairs to enter Entrance Stairs-Rails: Can reach both Entrance Stairs-Number of Steps: 4 Home Layout: One level Home Equipment: Crutches;Cane - single point      Prior Function Level of Independence: Independent               Hand Dominance        Extremity/Trunk Assessment   Upper Extremity Assessment: Generalized weakness (B grossly 3/5)           Lower Extremity Assessment: Generalized weakness (grossly 3+/5)         Communication  Communication: No difficulties  Cognition Arousal/Alertness: Awake/alert Behavior During Therapy: WFL for tasks assessed/performed Overall Cognitive Status: Within Functional Limits for tasks assessed                      General Comments      Exercises Other Exercises Other Exercises: Pt performed B LE ther-ex including 10 reps of quad sets, SLRs, SAQ, and heel slides. Pt requires cga for correct technique.      Assessment/Plan    PT Assessment Patient  needs continued PT services  PT Diagnosis Difficulty walking   PT Problem List Decreased strength;Decreased balance;Decreased safety awareness;Cardiopulmonary status limiting activity  PT Treatment Interventions Gait training;DME instruction;Therapeutic exercise   PT Goals (Current goals can be found in the Care Plan section) Acute Rehab PT Goals Patient Stated Goal: to get stronger PT Goal Formulation: With patient Time For Goal Achievement: 07/20/15 Potential to Achieve Goals: Good    Frequency Min 2X/week   Barriers to discharge        Co-evaluation               End of Session Equipment Utilized During Treatment: Gait belt;Oxygen Activity Tolerance: Patient limited by fatigue Patient left: in bed;with family/visitor present (with sitter present') Nurse Communication: Mobility status         Time: 2956-2130 PT Time Calculation (min) (ACUTE ONLY): 24 min   Charges:   PT Evaluation $Initial PT Evaluation Tier I: 1 Procedure PT Treatments $Therapeutic Exercise: 8-22 mins   PT G Codes:        Aicha Clingenpeel 07-20-15, 4:46 PM Elizabeth Palau, PT, DPT [phone]

## 2015-07-07 LAB — GLUCOSE, CAPILLARY
Glucose-Capillary: 72 mg/dL (ref 65–99)
Glucose-Capillary: 94 mg/dL (ref 65–99)
Glucose-Capillary: 98 mg/dL (ref 65–99)
Glucose-Capillary: 108 mg/dL — ABNORMAL HIGH (ref 65–99)

## 2015-07-07 LAB — BASIC METABOLIC PANEL
ANION GAP: 8 (ref 5–15)
BUN: 18 mg/dL (ref 6–20)
CALCIUM: 8.3 mg/dL — AB (ref 8.9–10.3)
CO2: 37 mmol/L — ABNORMAL HIGH (ref 22–32)
Chloride: 95 mmol/L — ABNORMAL LOW (ref 101–111)
Creatinine, Ser: 0.97 mg/dL (ref 0.61–1.24)
GFR calc Af Amer: >60 mL/min (ref >60)
GLUCOSE: 81 mg/dL (ref 65–99)
POTASSIUM: 3.7 mmol/L (ref 3.5–5.1)
SODIUM: 140 mmol/L (ref 135–145)

## 2015-07-07 LAB — AMMONIA: AMMONIA: 55 umol/L — AB (ref 9–35)

## 2015-07-07 MED ORDER — SPIRONOLACTONE 25 MG PO TABS
100.0000 mg | ORAL_TABLET | Freq: Every day | ORAL | Status: DC
  Administered 2015-07-07 – 2015-07-08 (×2): 100 mg via ORAL
  Filled 2015-07-07 (×2): qty 4

## 2015-07-07 MED ORDER — LORAZEPAM 1 MG PO TABS: 1.0000 mg | ORAL_TABLET | Freq: Four times a day (QID) | ORAL | Status: DC | PRN

## 2015-07-07 MED ORDER — LORAZEPAM 2 MG/ML IJ SOLN: 1.0000 mg | Freq: Four times a day (QID) | INTRAMUSCULAR | Status: DC | PRN

## 2015-07-07 NOTE — Progress Notes (Signed)
Problem: Discharge Progression Outcomes Goal: Other Discharge Outcomes/Goals Outcome: Progressing  VSS no suicidal thoughts or intentions verbalized  No complaints of pain  Having frequent bowel movements due to lactulose  CIWA Scale stable

## 2015-07-07 NOTE — Progress Notes (Signed)
GI Inpatient Follow-up Note  Patient Identification: Howard [name] is a 34 y.o. 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  Subjective:  Feels much better today.  Abd less distended. No f/c. No n/v. No rectal bleeding.   Scheduled Inpatient Medications:  . antiseptic oral rinse  7 mL Mouth Rinse BID  . cefTRIAXone (ROCEPHIN)  IV  2 g Intravenous Q24H  . citalopram  20 mg Oral Daily  . folic acid  1 mg Oral Daily  . furosemide  20 mg Oral BID  . insulin aspart  0-9 Units Subcutaneous TID WC  . lactulose  30 g Oral BID  . LORazepam  0-4 mg Oral Q12H  . losartan  100 mg Oral Daily  . metFORMIN  500 mg Oral BID WC  . metoprolol succinate  100 mg Oral Daily  . multivitamin with minerals  1 tablet Oral Q supper  . OLANZapine zydis  15 mg Oral QHS  . pantoprazole  40 mg Oral QAC breakfast  . sodium chloride  3 mL Intravenous Q12H  . spironolactone  50 mg Oral Daily  . thiamine  100 mg Oral Daily    Continuous Inpatient Infusions:     PRN Inpatient Medications:  acetaminophen **OR** [DISCONTINUED] acetaminophen, albuterol, LORazepam **OR** LORazepam, morphine injection, [DISCONTINUED] ondansetron **OR** ondansetron (ZOFRAN) IV     Physical Examination: BP 154/99 mmHg  Pulse 86  Temp(Src) 98 F (36.7 C) (Oral)  Resp 17  Ht 5\' 8"  (1.727 m)  Wt 146.104 kg (322 lb 1.6 oz)  BMI 48.99 kg/m2  SpO2 92% Gen: NAD, alert and oriented x 4 HEENT: PEERLA, EOMI, Neck: supple, no JVD or thyromegaly Chest: CTA bilaterally, no wheezes, crackles, or other adventitious sounds CV: RRR, no m/g/c/r Abd: soft, NT, + mild distension, + obese abd, +BS in all four quadrants; no HSM, guarding, ridigity, or rebound tenderness Ext: no edema, well perfused with 2+ pulses, Skin: no rash or lesions noted Lymph: no LAD  Data: Lab Results  Component Value Date   WBC 8.5 07/06/2015   HGB 12.6* 07/06/2015   HCT 38.3* 07/06/2015   MCV 98.7 07/06/2015   PLT 70*  07/06/2015    Recent Labs Lab 07/05/15 0126 07/05/15 0532 07/06/15 0529  HGB 13.5 13.2 12.6*   Lab Results  Component Value Date   NA 140 07/07/2015   K 3.7 07/07/2015   CL 95* 07/07/2015   CO2 37* 07/07/2015   BUN 18 07/07/2015   CREATININE 0.97 07/07/2015   Lab Results  Component Value Date   ALT 46 07/06/2015   AST 185* 07/06/2015   ALKPHOS 124 07/06/2015   BILITOT 5.2* 07/06/2015    Recent Labs Lab 07/04/15 1830  APTT 36  INR 1.35   Assessment/Plan: Howard [name] is a 34 y.o. male with newly diagnosed ETOH liver diseaes c/b Portal HTN and ascites  Recommendations: - cont lasix 20 and spiro 50 for now - watch K, na, Cr - low na diet - f/u in GI clinic - ETOH cessation - will sign off, call with questions.   Please call with questions or concerns.  REIN, Addison NaegeliMATTHEW GORDON, MD

## 2015-07-07 NOTE — Clinical Social Work Note (Signed)
Clinical Social Work Assessment  Patient Details  Name: Howard LinemanJoey A [name] MRN: [id number] Date of Birth: 10/20/1980  Date of referral:  07/07/15               Reason for consult:  Mental Health Concerns                Permission sought to share information with:  Family Supports Permission granted to share information::  Yes, Verbal Permission Granted  Name::        Agency::     Relationship::     Contact Information:     Housing/Transportation Living arrangements for the past 2 months:  Single Family Home Source of Information:  Patient, Parent Patient Interpreter Needed:  None Criminal Activity/Legal Involvement Pertinent to Current Situation/Hospitalization:  No - Comment as needed Significant Relationships:  Parents Lives with:  Parents Do you feel safe going back to the place where you live?  Yes Need for family participation in patient care:  No (Coment)  Care giving concerns:  Patient's mother resides with patient.   Social Worker assessment / plan:  CSW was asked by psychiatry to connect patient with the information for [facility] and their outpatient program. CSW provided patient with that information this afternoon. Patient states he is familiar with their agency. Patient's mother and an unidentified male were visiting patient this afternoon.   Employment status:  Disabled (Comment on whether or not currently receiving Disability) Insurance information:    PT Recommendations:    Information / Referral to community resources:  Outpatient Psychiatric Care (Comment Required)  Patient/Family's Response to care:  Patient receptive to information.  Patient/Family's Understanding of and Emotional Response to Diagnosis, Current Treatment, and Prognosis:  Patient verbalized understanding of [facility] recommendation.  Emotional Assessment Appearance:  Appears stated age Attitude/Demeanor/Rapport:   (pleasant and cooperative) Affect (typically observed):    Orientation:  Oriented to Self,  Oriented to Place, Oriented to  Time, Oriented to Situation Alcohol / Substance use:  Alcohol Use Psych involvement (Current and /or in the community):  Outpatient Provider, Yes (Comment)  Discharge Needs  Concerns to be addressed:  Mental Health Concerns Readmission within the last 30 days:  No Current discharge risk:  Psychiatric Illness, Substance Abuse Barriers to Discharge:  No Barriers Identified   York SpanielMonica Kairo Laubacher, LCSW 07/07/2015, 2:33 PM

## 2015-07-07 NOTE — Progress Notes (Signed)
[facility] [place] [facility]   PATIENT NAME: Howard [name]    MR#:  [id number]  DATE OF BIRTH:  [birth year]  SUBJECTIVE:  Doing well this am O2 is 89-90% RA Abdominal pain better  REVIEW OF SYSTEMS:  Review of Systems  Constitutional: Negative for fever, chills and malaise/fatigue.  HENT: Negative for congestion and nosebleeds.   Respiratory: Negative for cough, shortness of breath and wheezing.   Cardiovascular: Negative for chest pain and palpitations.  Gastrointestinal: Negative for nausea, vomiting, abdominal pain, diarrhea and constipation.  Genitourinary: Negative for dysuria and hematuria.  Musculoskeletal: Negative for myalgias.  Neurological: Negative for dizziness, sensory change, speech change, focal weakness, seizures, weakness and headaches.    DRUG ALLERGIES:  No Known Allergies  VITALS:  Blood pressure 145/76, pulse 88, temperature 98 F (36.7 C), temperature source Oral, resp. rate 17, height 5\' 8"  (1.727 m), weight 146.104 kg (322 lb 1.6 oz), SpO2 92 %.  PHYSICAL EXAMINATION:  Physical Exam  GENERAL:  34 y.o.-year-old patient lying in the bed with no acute distress.  EYES: Pupils equal, round, reactive to light and accommodation. No scleral icterus. Extraocular muscles intact.  HEENT: Head atraumatic, normocephalic. Oropharynx and nasopharynx clear. Dried blood in the Mnire's noted. No active bleeding at this time NECK:  Supple, no jugular venous distention. No thyroid enlargement, no tenderness.  LUNGS: Normal breath sounds bilaterally, no wheezing, rales,rhonchi or crepitation. No use of accessory muscles of respiration.  CARDIOVASCULAR: S1, S2 normal. No murmurs, rubs, or gallops.  ABDOMEN: Soft, nontender, abdomen is distended, no fluid thrill. Bowel sounds present. Hard to appreciate organomegaly or mass.  EXTREMITIES: No  cyanosis, or clubbing. 1+ pedal edema present NEUROLOGIC: Cranial nerves II through XII are intact.  Muscle strength 5/5 in all extremities. Sensation intact. Gait not checked.  PSYCHIATRIC: The patient is alert and oriented x 3.  SKIN: No obvious rash, lesion, or ulcer.    LABORATORY PANEL:   CBC  Recent Labs Lab 07/06/15 0529  WBC 8.5  HGB 12.6*  HCT 38.3*  PLT 70*   ------------------------------------------------------------------------------------------------------------------  Chemistries   Recent Labs Lab 07/06/15 0529 07/07/15 0507  NA 140 140  K 3.5 3.7  CL 96* 95*  CO2 36* 37*  GLUCOSE 72 81  BUN 16 18  CREATININE 1.18 0.97  CALCIUM 8.0* 8.3*  MG 1.4*  --   AST 185*  --   ALT 46  --   ALKPHOS 124  --   BILITOT 5.2*  --    ------------------------------------------------------------------------------------------------------------------  Cardiac Enzymes  Recent Labs Lab 07/04/15 1647  TROPONINI <0.03   ------------------------------------------------------------------------------------------------------------------  RADIOLOGY:  [place] Abdomen Limited Ruq  07/06/2015  CLINICAL DATA:  Hepatic cirrhosis. EXAM: US ABDOMEN LIMITED - RIGHT UPPER QUADRANT COMPARISON:  July 05, 2015. FINDINGS: Gallbladder: No gallstones or wall thickening visualized. No sonographic Murphy sign noted. Common bile duct: Diameter: 3.8 mm which is within normal limits. However, visualization is limited due to body habitus. Liver: Difficult to visualize due to body habitus. Increased echogenicity may be present suggesting fatty infiltration or diffuse hepatocellular disease. Focal lesion cannot be excluded on the basis of this exam. IMPRESSION: Exam is significantly limited due to body habitus. No definite evidence of gallstones or biliary dilatation is noted. Increased echogenicity of hepatic parenchyma is noted suggesting fatty infiltration or diffuse hepatocellular disease. Focal lesion cannot be excluded due to the limited imaging. Electronically Signed   By: Lupita RaiderJames  Green Jr, M.D.    On: 07/06/2015 11:51  EKG:     SSESSMENT AND PLAN:   34 year old male with past medical history significant for hypertension, depression, schizoaffective disorder, significant alcohol abuse presents to the hospital secondary to abdominal distention, dyspnea neck session and weakness.  1 abdominal distention-secondary to ascites, status post 4 L of ascites fluid removed by paracentesis in the emergency room. -Empirically started on Rocephin. 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 -Ultrasound of the abdomen shows splenomegaly and hepatic steatosis and possible cirrhosis. Labs with elevated AST, bilirubin elevated as well. Likely from recent alcohol abuse -GI consult appreciated -NH3 decreasing continue lactulose  2 acute hypoxic respiratory failure-hypoxic and tachypneic on admission. -CT of the chest negative for any PE. -Likely ascitic fluid compressing on the lungs, not improved after drainage. -Also has right-sided pleural effusion.   - on lasix, aldactone (increased dose to 100 mg) - improved symptoms  3 hypertension-on losartan, Blood pressure still elevated therefore increased Aldactone 100 mg daily  4 diabetes mellitus-on metformin. Add sliding scale insulin HbA1c is 6.1   5 Depression and schizoaffective disorder- -patient stopped taking his medication as outpatient. He was having significant  auditory hallucinations. -Zyprexa restarted here after admission. Appreciate psych consult.  6 DVT Ted's and SCDs. -Lovenox discontinued due  to thrombocytopenia and also epistaxis      Management plans discussed with the patient and he is in agreement.  CODE STATUS: Full Code  TOTAL TIME TAKING CARE OF THIS PATIENT: 25 minutes.   POSSIBLE D/C IN 2-3 DAYS, DEPENDING ON CLINICAL CONDITION.   Emmet Messer M.D on 07/07/2015 at 11:51 AM  Between 7am to 6pm - Pager - [phone]  After 6pm go to  [email or web address] - password [email or web address] [facility]  [facility]  Office  [phone]  CC: Primary care physician; Imelda Pillow, NP

## 2015-07-07 NOTE — Plan of Care (Signed)
Problem: Discharge Progression Outcomes Goal: Other Discharge Outcomes/Goals Outcome: Progressing  VSS no suicidal thoughts or intentions verbalized  No complaints of pain  Having frequent bowel movements due to lactulose

## 2015-07-08 LAB — BASIC METABOLIC PANEL
ANION GAP: 8 (ref 5–15)
BUN: 22 mg/dL — AB (ref 6–20)
CHLORIDE: 96 mmol/L — AB (ref 101–111)
CO2: 36 mmol/L — ABNORMAL HIGH (ref 22–32)
Calcium: 8.2 mg/dL — ABNORMAL LOW (ref 8.9–10.3)
Creatinine, Ser: 0.98 mg/dL (ref 0.61–1.24)
GFR calc Af Amer: >60 mL/min (ref >60)
GFR calc non Af Amer: >60 mL/min (ref >60)
GLUCOSE: 74 mg/dL (ref 65–99)
POTASSIUM: 3.6 mmol/L (ref 3.5–5.1)
Sodium: 140 mmol/L (ref 135–145)

## 2015-07-08 LAB — GLUCOSE, CAPILLARY
GLUCOSE-CAPILLARY: 95 mg/dL (ref 65–99)
Glucose-Capillary: 77 mg/dL (ref 65–99)

## 2015-07-08 MED ORDER — FUROSEMIDE 20 MG PO TABS: 20.0000 mg | ORAL_TABLET | Freq: Two times a day (BID) | ORAL | Status: DC

## 2015-07-08 MED ORDER — CEFUROXIME AXETIL 250 MG PO TABS: 250.0000 mg | ORAL_TABLET | Freq: Two times a day (BID) | ORAL | Status: DC

## 2015-07-08 MED ORDER — SPIRONOLACTONE 100 MG PO TABS: 100.0000 mg | ORAL_TABLET | Freq: Every day | ORAL | Status: DC

## 2015-07-08 NOTE — Plan of Care (Signed)
Problem: Discharge Progression Outcomes Goal: Other Discharge Outcomes/Goals Outcome: Progressing  VSS no suicidal thoughts or intentions verbalized  No complaints of pain  Having frequent bowel movements due to lactulose  CIWA Scale stable  

## 2015-07-08 NOTE — Discharge Summary (Signed)
[facility] [place] [facility]   PATIENT NAME: Howard BowlerJoey [name]    MR#:  [id number]  DATE OF BIRTH:  Sep 03, 1981  DATE OF ADMISSION:  07/04/2015 ADMITTING PHYSICIAN: Oralia Manisavid Willis, MD  DATE OF DISCHARGE: 07/08/2015   PRIMARY CARE PHYSICIAN: HOLLAND, CHELSA, NP    ADMISSION DIAGNOSIS:  Generalized abdominal pain [R10.84] Suicidal thoughts [R45.851] Hypoxia [R09.02] Ascites [R18.8] Psychosis, unspecified psychosis type [F29]  DISCHARGE DIAGNOSIS:  Principal Problem:   Hypoxia Active Problems:   Paranoid schizophrenia (HCC)   Hypertension   Alcohol abuse   Ascites   Elevated transaminase level   DOE (dyspnea on exertion)   SECONDARY DIAGNOSIS:   Past Medical History  Diagnosis Date  . Hypertension   . Schizophrenia (HCC)   . Hyperlipemia   . Depression   . Diabetes [facility](HCC)     HOSPITAL COURSE:  34 year old male with past medical history significant for hypertension, depression, schizoaffective disorder, significant alcohol abuse presents to the hospital secondary to abdominal distention, dyspnea neck session and weakness. For further details please refer the H&P.  1. Ascites with abdominal distention: Patient status post 4 L of ascites fluid removal by paracentesis. Patient had no evidence of SBP on laboratories. He had a low-grade temperature of 100.8 and therefore was empirically placed on Rocephin. He will be discharged with Ceftin. His cirrhosis is likely secondary to EtOH. Ultrasound of the abdomen shows splenic megaly and hepatic steatosis with possible cirrhosis. GI did see the patient in consultation and recommended Lasix and Aldactone. Ammonia level was slightly elevated and he was started on lactulose. Ammonia level is improved and his mental status is fine.  2. Acute hypoxic respiratory failure: This is secondary to ascitic fluid compressing on the lungs. 2-D echocardiogram showed normal ejection fraction and no valvular abnormalities. He  is on by mouth Lasix and Aldactone. His oxygen saturations is between 88 and 90% on room air and on exertion slightly lower. He will continue with O2.   3. Essential hypertension: Patient continues to be on losartan, Aldactone and Lasix.  4. Diabetes type 2 A1c is 6.1. Continue metformin  5. Depression with schizoaffective disorder: Patient was seen and evaluated by psychiatry. Patient was restarted on his outpatient medications which he had stopped taking.  DISCHARGE CONDITIONS AND DIET:  Patient is being discharged home on a heart healthy/low sodium diet with 1400 cc fluid restriction. She is being discharged in stable condition with home health  CONSULTS OBTAINED:  Treatment Team:  Audery AmelJohn T [name], MD Elnita MaxwellMatthew Gordon Rein, MD  DRUG ALLERGIES:  No Known Allergies  DISCHARGE MEDICATIONS:   Current Discharge Medication List    START taking these medications   Details  cefUROXime (CEFTIN) 250 MG tablet Take 1 tablet (250 mg total) by mouth 2 (two) times daily with a meal. Qty: 10 tablet, Refills: 0    furosemide (LASIX) 20 MG tablet Take 1 tablet (20 mg total) by mouth 2 (two) times daily. Qty: 60 tablet, Refills: 0    spironolactone (ALDACTONE) 100 MG tablet Take 1 tablet (100 mg total) by mouth daily. Qty: 30 tablet, Refills: 0      CONTINUE these medications which have NOT CHANGED   Details  albuterol (PROVENTIL HFA;VENTOLIN HFA) 108 (90 BASE) MCG/ACT inhaler Inhale 2 puffs into the lungs every 6 (six) hours as needed for wheezing or shortness of breath.     aspirin EC 81 MG tablet Take 81 mg by mouth daily.    citalopram (CELEXA) 20 MG tablet  Take 1 tablet (20 mg total) by mouth daily. Qty: 30 tablet, Refills: 5    diphenhydrAMINE (BENADRYL) 25 mg capsule Take 50 mg by mouth at bedtime as needed for sleep.    losartan-hydrochlorothiazide (HYZAAR) 100-25 MG per tablet Take 1 tablet by mouth daily.    metFORMIN (GLUCOPHAGE) 500 MG tablet Take 1 tablet (500 mg total)  by mouth 2 (two) times daily with a meal. Qty: 60 tablet, Refills: 3    metoprolol succinate (TOPROL-XL) 100 MG 24 hr tablet Take 100 mg by mouth daily.     OLANZapine (ZYPREXA) 15 MG tablet Take 15 mg by mouth at bedtime.    omeprazole (PRILOSEC) 40 MG capsule Take 40 mg by mouth daily.     simvastatin (ZOCOR) 40 MG tablet Take 40 mg by mouth at bedtime.               Today   CHIEF COMPLAINT:  Patient is doing well this point. Patient denies shortness of breath or chest pain. Patient feels that his abdominal ascites has much improved since admission.   VITAL SIGNS:  Blood pressure 127/71, pulse 87, temperature 98.8 F (37.1 C), temperature source Oral, resp. rate 18, height  (1.727 m), weight 144.153 kg (317 lb 12.8 oz), SpO2 95 %.   REVIEW OF SYSTEMS:  Review of Systems  Constitutional: Negative for fever, chills and malaise/fatigue.  HENT: Negative for sore throat.   Eyes: Negative for blurred vision.  Respiratory: Negative for cough, hemoptysis, shortness of breath and wheezing.   Cardiovascular: Negative for chest pain, palpitations and leg swelling.  Gastrointestinal: Negative for nausea, vomiting, abdominal pain, diarrhea and blood in stool.  Genitourinary: Negative for dysuria.  Musculoskeletal: Negative for back pain.  Neurological: Negative for dizziness, tremors and headaches.  Endo/Heme/Allergies: Does not bruise/bleed easily.     PHYSICAL EXAMINATION:  GENERAL:  34 y.o.-year-old patient lying in the bed with no acute distress.  NECK:  Supple, no jugular venous distention. No thyroid enlargement, no tenderness.  LUNGS: Normal breath sounds bilaterally, no wheezing, rales,rhonchi  No use of accessory muscles of respiration.  CARDIOVASCULAR: S1, S2 normal. No murmurs, rubs, or gallops.  ABDOMEN: Soft, non-tender, slight distention Bowel sounds present. Heart to appreciate organomegaly due to body habitus  EXTREMITIES: Minimal pedal edema, cyanosis,  or clubbing.  PSYCHIATRIC: The patient is alert and oriented x 3.  SKIN: No obvious rash, lesion, or ulcer.   DATA REVIEW:   CBC  Recent Labs Lab 07/06/15 0529  WBC 8.5  HGB 12.6*  HCT 38.3*  PLT 70*    Chemistries   Recent Labs Lab 07/06/15 0529  07/08/15 0453  NA 140  < > 140  K 3.5  < > 3.6  CL 96*  < > 96*  CO2 36*  < > 36*  GLUCOSE 72  < > 74  BUN 16  < > 22*  CREATININE 1.18  < > 0.98  CALCIUM 8.0*  < > 8.2*  MG 1.4*  --   --   AST 185*  --   --   ALT 46  --   --   ALKPHOS 124  --   --   BILITOT 5.2*  --   --   < > = values in this interval not displayed.  Cardiac Enzymes  Recent Labs Lab 07/04/15 1647  TROPONINI <0.03    Microbiology Results  @  RADIOLOGY:  US Abdomen Limited Ruq  07/06/2015  CLINICAL DATA:  Hepatic cirrhosis. EXAM:  US ABDOMEN LIMITED - RIGHT UPPER QUADRANT COMPARISON:  July 05, 2015. FINDINGS: Gallbladder: No gallstones or wall thickening visualized. No sonographic Murphy sign noted. Common bile duct: Diameter: 3.8 mm which is within normal limits. However, visualization is limited due to body habitus. Liver: Difficult to visualize due to body habitus. Increased echogenicity may be present suggesting fatty infiltration or diffuse hepatocellular disease. Focal lesion cannot be excluded on the basis of this exam. IMPRESSION: Exam is significantly limited due to body habitus. No definite evidence of gallstones or biliary dilatation is noted. Increased echogenicity of hepatic parenchyma is noted suggesting fatty infiltration or diffuse hepatocellular disease. Focal lesion cannot be excluded due to the limited imaging. Electronically Signed   By: Lupita Raider, M.D.   On: 07/06/2015 11:51      Management plans discussed with the patient and he is in agreement. Stable for discharge home with home health  Patient should follow up with GI in 1 week  CODE STATUS:     Code Status Orders        Start     Ordered    07/05/15 0116  Full code   Continuous     07/05/15 0116    Advance Directive Documentation        Most Recent Value   Type of Advance Directive  Healthcare Power of Attorney   Pre-existing out of facility DNR order (yellow form or pink MOST form)     "MOST" Form in Place?        TOTAL TIME TAKING CARE OF THIS PATIENT: 35 minutes.    Breyden Jeudy M.D on 07/08/2015 at 11:14 AM  Between 7am to 6pm - Pager - [phone] After 6pm go to [email or web address] - password [email or web address] [facility]  [facility]  Office  [phone]  CC: Primary care physician; Imelda Pillow, NP

## 2015-07-08 NOTE — Progress Notes (Signed)
[facility] home with home health RN, PT, Social Worker, home oxygen and rolling walker; Care Management previously established home health and home oxygen services with [facility]; verbally reviewed AVS with pt and pt's mother, Jamesetta GeraldsBetty Kendall, including medications/Rxs electronically submitted to [facility] on [address] [place], [place]; diet; activity level and follow up appointments/pt to call on 07/09/15 and schedule appointments with Dr Shelle Ironein and Josefa HalfM. Johnson PA for 1 week; no questions voiced at this time; pt's discharge pending him getting dressed to go home

## 2015-07-08 NOTE — Progress Notes (Signed)
Pt discharged via wheelchair on portable oxygen by nursing to the visitor's entrance

## 2015-07-08 NOTE — Progress Notes (Signed)
SATURATION QUALIFICATIONS: (This note is used to comply with regulatory documentation for home oxygen)  Patient Saturations on Room Air at Rest = 89%  Patient Saturations on Room Air while Ambulating = 83%  Patient Saturations on 3 Liters of oxygen while Ambulating = 91%  Please briefly explain why patient needs home oxygen: respiratory failure

## 2015-07-08 NOTE — Care Management Note (Signed)
Case Management Note  Patient Details  Name: Andee LinemanJoey A Moorman MRN: [id number] Date of Birth: 18-Dec-1980  Subjective/Objective:   [facility] that I will be faxing an order for oxygen humidification to be included with the home oxygen order faxed to [facility] earlier today.                  Action/Plan:   Expected Discharge Date:                  Expected Discharge Plan:     In-House Referral:     Discharge planning Services     Post Acute Care Choice:    Choice offered to:     DME Arranged:    DME Agency:     HH Arranged:    HH Agency:     Status of Service:     Medicare Important Message Given:  Yes-second notification given Date Medicare IM Given:    Medicare IM give by:    Date Additional Medicare IM Given:    Additional Medicare Important Message give by:     If discussed at Long Length of Stay Meetings, dates discussed:    Additional Comments:  Savian Mazon A, RN 07/08/2015, 1:23 PM

## 2015-07-08 NOTE — Care Management Note (Signed)
Case Management Note  Patient Details  Name: Howard [name] MRN: [id number] Date of Birth: [birth year]  Subjective/Objective:    New oxygen order faxed to [facility] with [facility]. Also ordered a rolling walker to be delivered to [facility]. Spoke with Dr Juliene PinaMody who will order a home health nurse also. The home health nurse request will be faxed as soon as it is in the computer.                Action/Plan:   Expected Discharge Date:                  Expected Discharge Plan:     In-House Referral:     Discharge planning Services     Post Acute Care Choice:    Choice offered to:     DME Arranged:    DME Agency:     HH Arranged:    HH Agency:     Status of Service:     Medicare Important Message Given:  Yes-second notification given Date Medicare IM Given:    Medicare IM give by:    Date Additional Medicare IM Given:    Additional Medicare Important Message give by:     If discussed at Long Length of Stay Meetings, dates discussed:    Additional Comments:  Corran Lalone A, RN 07/08/2015, 10:30 AM

## 2015-07-08 NOTE — Care Management Note (Signed)
Case Management Note  Patient Details  Name: Howard [name] MRN: [id number] Date of Birth: Oct 22, 1980  Subjective/Objective:   Referral sent to [facility] for PT, RN, Social Work. [facility] is also delivering and setting up new home oxygen to Howard [name] today, as well as delivering a rolling walker. RN will assure that Howard [name] is using home oxygen correctly and monitor his respiratory status at home. Social work has already provided Howard [name] with information to follow-up with [facility] for outpatient mental health treatment.                   Action/Plan:   Expected Discharge Date:                  Expected Discharge Plan:     In-House Referral:     Discharge planning Services     Post Acute Care Choice:    Choice offered to:     DME Arranged:    DME Agency:     HH Arranged:    HH Agency:     Status of Service:     Medicare Important Message Given:  Yes-second notification given Date Medicare IM Given:    Medicare IM give by:    Date Additional Medicare IM Given:    Additional Medicare Important Message give by:     If discussed at Long Length of Stay Meetings, dates discussed:    Additional Comments:  Edmon Magid A, RN 07/08/2015, 11:10 AM

## 2015-07-08 NOTE — Care Management Note (Signed)
Case Management Note  Patient Details  Name: Howard [name] MRN: [id number] Date of Birth: [birth year]  Subjective/Objective:        Howard [name] at [facility] okayed the oxygen for Howard [name] for 30 days after which he will need to requalify. Humidified portable tank oxygen was provided to Howard [name] in his hospital room for transport home.             Action/Plan:   Expected Discharge Date:                  Expected Discharge Plan:     In-House Referral:     Discharge planning Services     Post Acute Care Choice:    Choice offered to:     DME Arranged:    DME Agency:     HH Arranged:    HH Agency:     Status of Service:     Medicare Important Message Given:  Yes-second notification given Date Medicare IM Given:    Medicare IM give by:    Date Additional Medicare IM Given:    Additional Medicare Important Message give by:     If discussed at Long Length of Stay Meetings, dates discussed:    Additional Comments:  Howard Goggins A, RN 07/08/2015, 2:02 PM

## 2015-07-09 LAB — BODY FLUID CULTURE: CULTURE: NO GROWTH

## 2015-07-12 ENCOUNTER — Ambulatory Visit (INDEPENDENT_AMBULATORY_CARE_PROVIDER_SITE_OTHER): Payer: Medicare Other | Admitting: Psychiatry

## 2015-07-12 ENCOUNTER — Encounter: Payer: Self-pay | Admitting: Psychiatry

## 2015-07-12 VITALS — BP 110/72 | HR 75 | Temp 98.1°F | Ht 75.0 in | Wt 309.6 lb

## 2015-07-12 DIAGNOSIS — F200 Paranoid schizophrenia: Secondary | ICD-10-CM

## 2015-07-12 DIAGNOSIS — F1010 Alcohol abuse, uncomplicated: Secondary | ICD-10-CM | POA: Diagnosis not present

## 2015-07-12 MED ORDER — CITALOPRAM HYDROBROMIDE 20 MG PO TABS: 20.0000 mg | ORAL_TABLET | Freq: Every day | ORAL | Status: DC

## 2015-07-17 ENCOUNTER — Ambulatory Visit: Payer: Self-pay | Admitting: Psychiatry

## 2015-07-19 DIAGNOSIS — K7031 Alcoholic cirrhosis of liver with ascites: Secondary | ICD-10-CM | POA: Insufficient documentation

## 2015-07-27 ENCOUNTER — Encounter: Payer: Self-pay | Admitting: *Deleted

## 2015-07-28 ENCOUNTER — Encounter: Payer: Self-pay | Admitting: Psychiatry

## 2015-07-30 ENCOUNTER — Encounter: Payer: Self-pay | Admitting: Emergency Medicine

## 2015-07-30 ENCOUNTER — Inpatient Hospital Stay
Admission: EM | Admit: 2015-07-30 | Discharge: 2015-08-04 | DRG: 377 | Disposition: A | Discharge status: Home Health | Payer: Medicare Other | Attending: Internal Medicine | Admitting: Internal Medicine

## 2015-07-30 ENCOUNTER — Emergency Department: Payer: Medicare Other

## 2015-07-30 DIAGNOSIS — I10 Essential (primary) hypertension: Secondary | ICD-10-CM | POA: Diagnosis present

## 2015-07-30 DIAGNOSIS — I426 Alcoholic cardiomyopathy: Secondary | ICD-10-CM | POA: Diagnosis present

## 2015-07-30 DIAGNOSIS — F1010 Alcohol abuse, uncomplicated: Secondary | ICD-10-CM | POA: Diagnosis present

## 2015-07-30 DIAGNOSIS — F209 Schizophrenia, unspecified: Secondary | ICD-10-CM | POA: Diagnosis present

## 2015-07-30 DIAGNOSIS — K7030 Alcoholic cirrhosis of liver without ascites: Secondary | ICD-10-CM | POA: Diagnosis present

## 2015-07-30 DIAGNOSIS — K920 Hematemesis: Secondary | ICD-10-CM | POA: Diagnosis present

## 2015-07-30 DIAGNOSIS — D500 Iron deficiency anemia secondary to blood loss (chronic): Secondary | ICD-10-CM | POA: Diagnosis present

## 2015-07-30 DIAGNOSIS — N170 Acute kidney failure with tubular necrosis: Secondary | ICD-10-CM | POA: Diagnosis present

## 2015-07-30 DIAGNOSIS — E875 Hyperkalemia: Secondary | ICD-10-CM | POA: Diagnosis present

## 2015-07-30 DIAGNOSIS — Z823 Family history of stroke: Secondary | ICD-10-CM

## 2015-07-30 DIAGNOSIS — K3189 Other diseases of stomach and duodenum: Secondary | ICD-10-CM | POA: Diagnosis present

## 2015-07-30 DIAGNOSIS — E785 Hyperlipidemia, unspecified: Secondary | ICD-10-CM | POA: Diagnosis present

## 2015-07-30 DIAGNOSIS — I8500 Esophageal varices without bleeding: Secondary | ICD-10-CM | POA: Diagnosis present

## 2015-07-30 DIAGNOSIS — E669 Obesity, unspecified: Secondary | ICD-10-CM | POA: Diagnosis present

## 2015-07-30 DIAGNOSIS — R188 Other ascites: Secondary | ICD-10-CM

## 2015-07-30 DIAGNOSIS — K921 Melena: Secondary | ICD-10-CM | POA: Diagnosis present

## 2015-07-30 DIAGNOSIS — I959 Hypotension, unspecified: Secondary | ICD-10-CM | POA: Diagnosis present

## 2015-07-30 DIAGNOSIS — Z7984 Long term (current) use of oral hypoglycemic drugs: Secondary | ICD-10-CM | POA: Diagnosis not present

## 2015-07-30 DIAGNOSIS — T502X5A Adverse effect of carbonic-anhydrase inhibitors, benzothiadiazides and other diuretics, initial encounter: Secondary | ICD-10-CM | POA: Diagnosis present

## 2015-07-30 DIAGNOSIS — D6959 Other secondary thrombocytopenia: Secondary | ICD-10-CM | POA: Diagnosis present

## 2015-07-30 DIAGNOSIS — E119 Type 2 diabetes mellitus without complications: Secondary | ICD-10-CM | POA: Diagnosis present

## 2015-07-30 DIAGNOSIS — Z9981 Dependence on supplemental oxygen: Secondary | ICD-10-CM | POA: Diagnosis not present

## 2015-07-30 DIAGNOSIS — Z7982 Long term (current) use of aspirin: Secondary | ICD-10-CM | POA: Diagnosis not present

## 2015-07-30 DIAGNOSIS — Z79899 Other long term (current) drug therapy: Secondary | ICD-10-CM | POA: Diagnosis not present

## 2015-07-30 DIAGNOSIS — Z6835 Body mass index (BMI) 35.0-35.9, adult: Secondary | ICD-10-CM

## 2015-07-30 DIAGNOSIS — K766 Portal hypertension: Secondary | ICD-10-CM | POA: Diagnosis present

## 2015-07-30 DIAGNOSIS — K5900 Constipation, unspecified: Secondary | ICD-10-CM

## 2015-07-30 DIAGNOSIS — N179 Acute kidney failure, unspecified: Secondary | ICD-10-CM | POA: Diagnosis present

## 2015-07-30 DIAGNOSIS — Z8249 Family history of ischemic heart disease and other diseases of the circulatory system: Secondary | ICD-10-CM

## 2015-07-30 LAB — CBC
HEMATOCRIT: 44.9 % (ref 40.0–52.0)
HEMOGLOBIN: 15.1 g/dL (ref 13.0–18.0)
MCH: 32.7 pg (ref 26.0–34.0)
MCHC: 33.6 g/dL (ref 32.0–36.0)
MCV: 97 fL (ref 80.0–100.0)
Platelets: 129 10*3/uL — ABNORMAL LOW (ref 150–440)
RBC: 4.62 MIL/uL (ref 4.40–5.90)
RDW: 16.6 % — ABNORMAL HIGH (ref 11.5–14.5)
WBC: 10.9 10*3/uL — AB (ref 3.8–10.6)

## 2015-07-30 LAB — TYPE AND SCREEN
ABO/RH(D): O POS
ANTIBODY SCREEN: NEGATIVE

## 2015-07-30 LAB — BASIC METABOLIC PANEL
ANION GAP: 13 (ref 5–15)
BUN: 81 mg/dL — ABNORMAL HIGH (ref 6–20)
CO2: 18 mmol/L — ABNORMAL LOW (ref 22–32)
Calcium: 9.8 mg/dL (ref 8.9–10.3)
Chloride: 107 mmol/L (ref 101–111)
Creatinine, Ser: 2.98 mg/dL — ABNORMAL HIGH (ref 0.61–1.24)
GFR calc Af Amer: 29 mL/min — ABNORMAL LOW (ref >60)
GFR, EST NON AFRICAN AMERICAN: 25 mL/min — AB (ref >60)
GLUCOSE: 76 mg/dL (ref 65–99)
POTASSIUM: 5.2 mmol/L — AB (ref 3.5–5.1)
Sodium: 138 mmol/L (ref 135–145)

## 2015-07-30 LAB — MAGNESIUM: MAGNESIUM: 1.6 mg/dL — AB (ref 1.7–2.4)

## 2015-07-30 LAB — COMPREHENSIVE METABOLIC PANEL
ALBUMIN: 4.1 g/dL (ref 3.5–5.0)
ALT: 95 U/L — ABNORMAL HIGH (ref 17–63)
ANION GAP: 10 (ref 5–15)
AST: 108 U/L — AB (ref 15–41)
Alkaline Phosphatase: 80 U/L (ref 38–126)
BILIRUBIN TOTAL: 2 mg/dL — AB (ref 0.3–1.2)
BUN: 90 mg/dL — ABNORMAL HIGH (ref 6–20)
CHLORIDE: 102 mmol/L (ref 101–111)
CO2: 22 mmol/L (ref 22–32)
Calcium: 10.4 mg/dL — ABNORMAL HIGH (ref 8.9–10.3)
Creatinine, Ser: 3.59 mg/dL — ABNORMAL HIGH (ref 0.61–1.24)
GFR calc Af Amer: 24 mL/min — ABNORMAL LOW (ref >60)
GFR calc non Af Amer: 20 mL/min — ABNORMAL LOW (ref >60)
GLUCOSE: 93 mg/dL (ref 65–99)
POTASSIUM: 7 mmol/L — AB (ref 3.5–5.1)
Sodium: 134 mmol/L — ABNORMAL LOW (ref 135–145)
TOTAL PROTEIN: 9.4 g/dL — AB (ref 6.5–8.1)

## 2015-07-30 LAB — GLUCOSE, CAPILLARY
GLUCOSE-CAPILLARY: 56 mg/dL — AB (ref 65–99)
GLUCOSE-CAPILLARY: 82 mg/dL (ref 65–99)
Glucose-Capillary: 90 mg/dL (ref 65–99)

## 2015-07-30 LAB — PROTIME-INR
INR: 1.29
PROTHROMBIN TIME: 16.3 s — AB (ref 11.4–15.0)

## 2015-07-30 LAB — PHOSPHORUS: Phosphorus: 6.2 mg/dL — ABNORMAL HIGH (ref 2.5–4.6)

## 2015-07-30 LAB — AMMONIA: Ammonia: 116 umol/L — ABNORMAL HIGH (ref 9–35)

## 2015-07-30 LAB — APTT: APTT: 36 s (ref 24–36)

## 2015-07-30 LAB — ABO/RH: ABO/RH(D): O POS

## 2015-07-30 SURGERY — ESOPHAGOGASTRODUODENOSCOPY (EGD) WITH PROPOFOL

## 2015-07-30 MED ORDER — ADULT MULTIVITAMIN W/MINERALS CH
1.0000 | ORAL_TABLET | Freq: Every day | ORAL | Status: DC
  Administered 2015-07-30 – 2015-08-04 (×6): 1 via ORAL
  Filled 2015-07-30 (×6): qty 1

## 2015-07-30 MED ORDER — DEXTROSE 50 % IV SOLN
1.0000 | Freq: Once | INTRAVENOUS | Status: AC
  Administered 2015-07-30: 50 mL via INTRAVENOUS
  Filled 2015-07-30: qty 50

## 2015-07-30 MED ORDER — CALCIUM GLUCONATE 10 % IV SOLN
1.0000 g | Freq: Once | INTRAVENOUS | Status: AC
  Administered 2015-07-30: 1 g via INTRAVENOUS
  Filled 2015-07-30: qty 10

## 2015-07-30 MED ORDER — PROSIGHT PO TABS: 1.0000 | ORAL_TABLET | Freq: Every day | ORAL | Status: DC

## 2015-07-30 MED ORDER — INSULIN ASPART 100 UNIT/ML ~~LOC~~ SOLN
10.0000 [IU] | Freq: Once | SUBCUTANEOUS | Status: AC
  Administered 2015-07-30: 10 [IU] via SUBCUTANEOUS
  Filled 2015-07-30: qty 10

## 2015-07-30 MED ORDER — OLANZAPINE 7.5 MG PO TABS
15.0000 mg | ORAL_TABLET | Freq: Every day | ORAL | Status: DC
  Administered 2015-07-30 – 2015-08-04 (×5): 15 mg via ORAL
  Filled 2015-07-30 (×5): qty 2

## 2015-07-30 MED ORDER — OXYCODONE HCL 5 MG PO TABS: 5.0000 mg | ORAL_TABLET | ORAL | Status: DC | PRN

## 2015-07-30 MED ORDER — SODIUM CHLORIDE 0.9 % IV SOLN
80.0000 mg | Freq: Once | INTRAVENOUS | Status: AC
  Administered 2015-07-30: 80 mg via INTRAVENOUS
  Filled 2015-07-30: qty 80

## 2015-07-30 MED ORDER — ALBUTEROL SULFATE (2.5 MG/3ML) 0.083% IN NEBU: 2.5000 mg | INHALATION_SOLUTION | Freq: Four times a day (QID) | RESPIRATORY_TRACT | Status: DC | PRN

## 2015-07-30 MED ORDER — SODIUM CHLORIDE 0.9 % IV SOLN
8.0000 mg/h | INTRAVENOUS | Status: DC
  Administered 2015-07-30: 8 mg/h via INTRAVENOUS
  Filled 2015-07-30: qty 80

## 2015-07-30 MED ORDER — ONDANSETRON HCL 4 MG/2ML IJ SOLN
4.0000 mg | Freq: Four times a day (QID) | INTRAMUSCULAR | Status: DC | PRN
  Filled 2015-07-30: qty 2

## 2015-07-30 MED ORDER — SODIUM CHLORIDE 0.9 % IV BOLUS (SEPSIS)
1000.0000 mL | Freq: Once | INTRAVENOUS | Status: AC
  Administered 2015-07-30: 1000 mL via INTRAVENOUS

## 2015-07-30 MED ORDER — SIMVASTATIN 40 MG PO TABS
40.0000 mg | ORAL_TABLET | Freq: Every day | ORAL | Status: DC
  Administered 2015-07-30: 40 mg via ORAL
  Filled 2015-07-30: qty 1

## 2015-07-30 MED ORDER — INSULIN REGULAR HUMAN 100 UNIT/ML IJ SOLN: 10.0000 [IU] | Freq: Once | INTRAMUSCULAR | Status: DC

## 2015-07-30 MED ORDER — SENNOSIDES-DOCUSATE SODIUM 8.6-50 MG PO TABS
1.0000 | ORAL_TABLET | Freq: Every evening | ORAL | Status: DC | PRN
Start: 2015-07-30 — End: 2015-08-04

## 2015-07-30 MED ORDER — PANTOPRAZOLE SODIUM 40 MG IV SOLR
40.0000 mg | Freq: Two times a day (BID) | INTRAVENOUS | Status: DC
  Administered 2015-07-30 – 2015-08-04 (×9): 40 mg via INTRAVENOUS
  Filled 2015-07-30 (×9): qty 40

## 2015-07-30 MED ORDER — ONDANSETRON HCL 4 MG PO TABS: 4.0000 mg | ORAL_TABLET | Freq: Four times a day (QID) | ORAL | Status: DC | PRN

## 2015-07-30 MED ORDER — SODIUM CHLORIDE 0.9 % IV SOLN
INTRAVENOUS | Status: DC
  Administered 2015-07-30 – 2015-08-02 (×6): via INTRAVENOUS
  Administered 2015-08-02: 1 mL via INTRAVENOUS
  Administered 2015-08-03: 15:00:00 via INTRAVENOUS
  Administered 2015-08-03: 1 mL via INTRAVENOUS

## 2015-07-30 MED ORDER — ALBUTEROL SULFATE (2.5 MG/3ML) 0.083% IN NEBU
2.5000 mg | INHALATION_SOLUTION | Freq: Once | RESPIRATORY_TRACT | Status: AC
  Administered 2015-07-30: 2.5 mg via RESPIRATORY_TRACT
  Filled 2015-07-30: qty 3

## 2015-07-30 MED ORDER — INSULIN ASPART 100 UNIT/ML ~~LOC~~ SOLN
0.0000 [IU] | Freq: Three times a day (TID) | SUBCUTANEOUS | Status: DC
  Administered 2015-08-02: 1 [IU] via SUBCUTANEOUS

## 2015-07-30 MED ORDER — OCUVITE-LUTEIN PO CAPS: 1.0000 | ORAL_CAPSULE | Freq: Every day | ORAL | Status: DC

## 2015-07-30 MED ORDER — DOCUSATE SODIUM 100 MG PO CAPS
100.0000 mg | ORAL_CAPSULE | Freq: Two times a day (BID) | ORAL | Status: DC
  Administered 2015-07-30 – 2015-08-04 (×9): 100 mg via ORAL
  Filled 2015-07-30 (×9): qty 1

## 2015-07-30 NOTE — H&P (Addendum)
[facility] - [place] at [facility]   PATIENT NAME: Howard [name]    MR#:  [id number]  DATE OF BIRTH:  November 23, 1980  DATE OF ADMISSION:  07/30/2015  PRIMARY CARE PHYSICIAN: HOLLAND, CHELSA, NP   REQUESTING/REFERRING PHYSICIAN: dr Mayford Knife   passing black tarry stools for HISTORY OF PRESENT ILLNESS:  Howard [name]  is a 34 y.o. male with a known history of hypertension, depression, history of schizophrenia, significant alcohol abuse in the past, who was diagnosed with alcohol induced liver disease (hepatic steatosis and possible early cirrhosis with ascites status post paracentesis 4.5 L in October 2016 ) consent emergency room with overall feeling weak and passing black tarry stools for couple days.   patient denies any hematemesis. He did have some vomiting with a few specks of coffee ground emesis. He was found to be in significant renal failure with creatinine of 3.59. His creatinine 2 weeks ago was 0.9. Patient's hemoglobin is 16.5. Appears to be hemoconcentrated. He is he currently is getting IV fluids. We'll admit him for acute renal failure.   PAST MEDICAL HISTORY:   Past Medical History  Diagnosis Date  . Hypertension   . Schizophrenia (HCC)   . Hyperlipemia   . Depression   . Diabetes (HCC)   . Liver disease   . Diabetes mellitus, type II (HCC)   . Heart disease   . Cirrhosis of liver with ascites (HCC)     PAST SURGICAL HISTOIRY:   Past Surgical History  Procedure Laterality Date  . No past surgeries      SOCIAL HISTORY:   Social History  Substance Use Topics  . Smoking status: Never Smoker   . Smokeless tobacco: Never Used  . Alcohol Use: No     Comment: states quit drinking 1 month ago.    FAMILY HISTORY:   Family History  Problem Relation Age of Onset  . Heart disease Mother   . Hypertension Mother   . Hyperlipidemia Mother   . Stroke Father   . Heart attack Father   . Hypertension Father   . Heart disease Brother      DRUG ALLERGIES:  No Known Allergies  REVIEW OF SYSTEMS:  Review of Systems  Constitutional: Negative for fever, chills and weight loss.  HENT: Negative for ear discharge, ear pain and nosebleeds.   Eyes: Negative for blurred vision, pain and discharge.  Respiratory: Negative for sputum production, shortness of breath, wheezing and stridor.   Cardiovascular: Negative for chest pain, palpitations, orthopnea and PND.  Gastrointestinal: Positive for vomiting, abdominal pain and melena. Negative for nausea and diarrhea.  Genitourinary: Negative for urgency and frequency.  Musculoskeletal: Negative for back pain and joint pain.  Neurological: Positive for weakness. Negative for sensory change, speech change and focal weakness.  Psychiatric/Behavioral: Negative for depression. The patient is not nervous/anxious.   All other systems reviewed and are negative.    MEDICATIONS AT HOME:   Prior to Admission medications   Medication Sig Start Date End Date Taking? Authorizing Provider  albuterol (PROVENTIL HFA;VENTOLIN HFA) 108 (90 BASE) MCG/ACT inhaler Inhale 2 puffs into the lungs every 6 (six) hours as needed for wheezing or shortness of breath.    Yes Historical Provider, MD  aspirin EC 81 MG tablet Take 81 mg by mouth daily.   Yes Historical Provider, MD  clobetasol cream (TEMOVATE) 0.05 % Apply 1 application topically as needed (for skin irritation).   Yes Historical Provider, MD  diphenhydrAMINE (BENADRYL) 25 mg capsule Take  50 mg by mouth at bedtime as needed for sleep.   Yes Historical Provider, MD  furosemide (LASIX) 20 MG tablet Take 1 tablet (20 mg total) by mouth 2 (two) times daily. 07/08/15  Yes Howard Mody, MD  losartan-hydrochlorothiazide (HYZAAR) 100-25 MG per tablet Take 1 tablet by mouth daily.   Yes Historical Provider, MD  metFORMIN (GLUCOPHAGE) 500 MG tablet Take 1 tablet (500 mg total) by mouth 2 (two) times daily with a meal. 06/01/15  Yes Jolanta B Pucilowska, MD   metoprolol succinate (TOPROL-XL) 100 MG 24 hr tablet Take 100 mg by mouth daily.    Yes Historical Provider, MD  OLANZapine (ZYPREXA) 15 MG tablet Take 15 mg by mouth at bedtime.   Yes Historical Provider, MD  omeprazole (PRILOSEC) 40 MG capsule Take 40 mg by mouth daily.    Yes Historical Provider, MD  simvastatin (ZOCOR) 40 MG tablet Take 40 mg by mouth at bedtime.    Yes Historical Provider, MD  spironolactone (ALDACTONE) 100 MG tablet Take 1 tablet (100 mg total) by mouth daily. 07/08/15  Yes Adrian SaranSital Mody, MD  cefUROXime (CEFTIN) 250 MG tablet Take 1 tablet (250 mg total) by mouth 2 (two) times daily with a meal. Patient not taking: Reported on 07/30/2015 07/08/15   Adrian SaranSital Mody, MD      VITAL SIGNS:  Blood pressure 108/61, pulse 61, temperature 97.6 F (36.4 C), temperature source Oral, resp. rate 20, height 6\' 3"  (1.905 m), weight 128.368 kg (283 lb), SpO2 94 %.  PHYSICAL EXAMINATION:  GENERAL:  34 y.o.-year-old patient lying in the bed with no acute distress. morbid obesity  EYES: Pupils equal, round, reactive to light and accommodation. No scleral icterus. Extraocular muscles intact.  HEENT: Head atraumatic, normocephalic. Oropharynx and nasopharynx clear.  dry oral mucosa  NECK:  Supple, no jugular venous distention. No thyroid enlargement, no tenderness.  LUNGS: Normal breath sounds bilaterally, no wheezing, rales,rhonchi or crepitation. No use of accessory muscles of respiration.  CARDIOVASCULAR: S1, S2 normal. No murmurs, rubs, or gallops.  ABDOMEN: Soft, nontender, +distended. Bowel sounds present. No organomegaly or mass.  no fluid thrill felt  EXTREMITIES: No pedal edema, cyanosis, or clubbing.  NEUROLOGIC: Cranial nerves II through XII are intact. Muscle strength 5/5 in all extremities. Sensation intact. Gait not checked.  PSYCHIATRIC: The patient is alert and oriented x 3.  SKIN: No obvious rash, lesion, or ulcer.   LABORATORY PANEL:   CBC  Recent Labs Lab 07/30/15 1237   WBC 10.9*  HGB 15.1  HCT 44.9  PLT 129*   ------------------------------------------------------------------------------------------------------------------  Chemistries   Recent Labs Lab 07/30/15 1237  NA 134*  K 7.0*  CL 102  CO2 22  GLUCOSE 93  BUN 90*  CREATININE 3.59*  CALCIUM 10.4*  AST 108*  ALT 95*  ALKPHOS 80  BILITOT 2.0*   ------------------------------------------------------------------------------------------------------------------  Cardiac Enzymes No results for input(s): TROPONINI in the last 168 hours. ------------------------------------------------------------------------------------------------------------------  RADIOLOGY:  Dg Chest Portable 1 View  07/30/2015  CLINICAL DATA:  Black tarry stools for 2 days, history of heart disease, nonsmoker EXAM: PORTABLE CHEST 1 VIEW COMPARISON:  07/04/2015 FINDINGS: The heart size and mediastinal contours are within normal limits. Both lungs are clear. The visualized skeletal structures are unremarkable. IMPRESSION: No active disease. Electronically Signed   By: Esperanza Heiraymond  Rubner M.D.   On: 07/30/2015 14:03   IMPRESSION AND PLAN:   Howard [name]  is a 34 y.o. male with a known history of hypertension, depression, history of schizophrenia, significant  alcohol abuse in the past, who was diagnosed with alcohol induced liver disease (hepatic steatosis and possible early cirrhosis with ascites status post paracentesis 4.5 L in October 2016 ) consent emergency room with overall feeling weak and passing black tarry stools for couple days.   1. Acute renal failure appears prerenal azotemia and the setting of by mouth meds with Lasix spironolactone hydrochlorothiazide and poor by mouth intake. There is possibility of slow GI bleed as well. Admit to medical floor   IV fluids for hydration   monitor I's and O's. Nephrology consultation. Avoid nephrotoxins. Follow-up BMP  2. Melanotic stools which are heme occult  positive Patient has history of colon is early cirrhosis with ascites he probably likely has esophageal varices/gastritis No evidence of active bleeding H&H stable at present in fact more hemoconcentrated   we'll give IV Protonix twice a day. GI consultation as needed monitor H&H  3. Type 2 diabetes I'll hold off on metformin given renal failure we'll continue sliding scale insulin. Once creatinine is at baseline resume metformin  4. Hypertension. Patient has related to hypotension at present I'll hold off on his BP meds which is hydrochlorothiazide losartan and metoprolol  5. His history of schizophrenia Continue Zyprexa  6. DVT prophylaxis SCD and TEDS Hold off antiplatelets given Hemoccult-positive stools  7 GI prophylaxis on IV Protonix  8. Hyperkalemia K 7.0 w/o EKG changes recvd IV Dextrose with insulin, calcium gluocnate BMP in 3 hours  Above was discussed with patient and patient's mother was present emergency room All the records are reviewed and case discussed with ED provider. Management plans discussed with the patient, family and they are in agreement.  CODE STATUS: Full   TOTAL TIME TAKING CARE OF THIS PATIENT: .    Howard [name] M.D on 07/30/2015 at 2:55 PM  Between 7am to 6pm - Pager - [phone]  After 6pm go to [email or web address] - password [email or web address] [facility]  [place] [facility]  Office  [phone]  CC: Primary care physician; Imelda Pillow, NP

## 2015-07-30 NOTE — Progress Notes (Signed)
Received pt from ED to Rm 213. Pt AOx4. VSS. No signs of acute distress. IV access x2 intact and IVF running. Medications administered (See MAR). Admission completed. Assessment completed. DVT prophylaxis assessed and completed.   Education provided on call bell, bed alarm, telephone and IV/IV Pole/IV Alarms. Education presented on use of [employer] as a Network engineerpatient/nurse communication tool. White Board was completed and updated PRN.   Dietary specification confirmed.   VSS. Family at bedside. Pt resting comfortably and quietly w/bed in low and locked position and call bell and telephone within reach. Will continue to monitor.

## 2015-07-30 NOTE — ED Notes (Signed)
Sent to ED from PCP for evaluation for black tarry stools, onset of symptoms Saturday night.

## 2015-07-30 NOTE — ED Provider Notes (Signed)
[facility] Emergency Department Provider Note  ____________________________________________  Time seen: Approximately 1:20 PM  I have reviewed the triage vital signs and the nursing notes.   HISTORY  Chief Complaint GI Bleeding    HPI Howard [name] is a 34 y.o. male with history of alcoholic cirrhosis with ascites, hypertension, diabetes, schizophrenia who presents for evaluation of 3 days dark tarry stools, gradual onset, intermittent, worsening. Patient has also had 2-3 episodes of coffee-ground emesis. No gross blood in his vomit or stool. No abdominal pain, fevers or chills. He has been fatigued. No modifying factors.   Past Medical History  Diagnosis Date  . Hypertension   . Schizophrenia (HCC)   . Hyperlipemia   . Depression   . Diabetes (HCC)   . Liver disease   . Diabetes mellitus, type II (HCC)   . Heart disease   . Cirrhosis of liver with ascites [facility])     Patient Active Problem List   Diagnosis Date Noted  . Hypoxia 07/04/2015  . Ascites 07/04/2015  . Elevated transaminase level 07/04/2015  . DOE (dyspnea on exertion) 07/04/2015  . Alcohol abuse 05/31/2015  . Hypertension 05/29/2015  . Paranoid schizophrenia (HCC) 03/20/2015    Past Surgical History  Procedure Laterality Date  . No past surgeries      Current Outpatient Rx  Name  Route  Sig  Dispense  Refill  . albuterol (PROVENTIL HFA;VENTOLIN HFA) 108 (90 BASE) MCG/ACT inhaler   Inhalation   Inhale 2 puffs into the lungs every 6 (six) hours as needed for wheezing or shortness of breath.          Marland Kitchen albuterol (PROVENTIL HFA;VENTOLIN HFA) 108 (90 BASE) MCG/ACT inhaler   Inhalation   Inhale 2 puffs into the lungs every 4 (four) hours as needed for wheezing or shortness of breath.         Marland Kitchen aspirin EC 81 MG tablet   Oral   Take 81 mg by mouth daily.         Marland Kitchen aspirin EC 81 MG tablet   Oral   Take 81 mg by mouth daily.         . cefUROXime (CEFTIN) 250 MG  tablet   Oral   Take 1 tablet (250 mg total) by mouth 2 (two) times daily with a meal.   10 tablet   0   . cefUROXime (CEFTIN) 250 MG tablet   Oral   Take 250 mg by mouth 2 (two) times daily with a meal.         . citalopram (CELEXA) 20 MG tablet   Oral   Take 1 tablet (20 mg total) by mouth daily.   30 tablet   5   . citalopram (CELEXA) 20 MG tablet   Oral   Take 20 mg by mouth daily.         . clobetasol cream (TEMOVATE) 0.05 %      APPLY THINLY TO AFFECTED PSORIATIC REGIONS DAILY      5   . diphenhydrAMINE (BENADRYL) 25 mg capsule   Oral   Take 50 mg by mouth at bedtime as needed for sleep.         . diphenhydrAMINE (BENADRYL) 25 mg capsule   Oral   Take 25 mg by mouth every 6 (six) hours as needed.         . furosemide (LASIX) 20 MG tablet   Oral   Take 1 tablet (20 mg total) by mouth  2 (two) times daily.   60 tablet   0   . furosemide (LASIX) 20 MG tablet   Oral   Take 20 mg by mouth 2 (two) times daily.         Marland Kitchen losartan-hydrochlorothiazide (HYZAAR) 100-25 MG per tablet   Oral   Take 1 tablet by mouth daily.         Marland Kitchen losartan-hydrochlorothiazide (HYZAAR) 100-25 MG tablet   Oral   Take 1 tablet by mouth daily.         . metFORMIN (GLUCOPHAGE) 500 MG tablet   Oral   Take 1 tablet (500 mg total) by mouth 2 (two) times daily with a meal.   60 tablet   3   . metFORMIN (GLUCOPHAGE) 500 MG tablet   Oral   Take 500 mg by mouth 2 (two) times daily with a meal.         . metoprolol succinate (TOPROL-XL) 100 MG 24 hr tablet   Oral   Take 100 mg by mouth daily.          . metoprolol-hydrochlorothiazide (LOPRESSOR HCT) 100-25 MG tablet   Oral   Take 1 tablet by mouth daily.         Marland Kitchen OLANZapine (ZYPREXA) 15 MG tablet   Oral   Take 15 mg by mouth at bedtime.         Marland Kitchen OLANZapine (ZYPREXA) 15 MG tablet   Oral   Take 15 mg by mouth at bedtime.         Marland Kitchen omeprazole (PRILOSEC) 40 MG capsule   Oral   Take 40 mg by mouth  daily.          Marland Kitchen omeprazole (PRILOSEC) 40 MG capsule   Oral   Take 40 mg by mouth daily.         . simvastatin (ZOCOR) 40 MG tablet   Oral   Take 40 mg by mouth at bedtime.          . simvastatin (ZOCOR) 40 MG tablet   Oral   Take 40 mg by mouth at bedtime.         Marland Kitchen spironolactone (ALDACTONE) 100 MG tablet   Oral   Take 1 tablet (100 mg total) by mouth daily.   30 tablet   0   . spironolactone (ALDACTONE) 100 MG tablet   Oral   Take 100 mg by mouth daily.           Allergies Review of patient's allergies indicates no known allergies.  Family History  Problem Relation Age of Onset  . Heart disease Mother   . Hypertension Mother   . Hyperlipidemia Mother   . Stroke Father   . Heart attack Father   . Hypertension Father   . Heart disease Brother     Social History Social History  Substance Use Topics  . Smoking status: Never Smoker   . Smokeless tobacco: Never Used  . Alcohol Use: No     Comment: states quit drinking 1 month ago.    Review of Systems Constitutional: No fever/chills Eyes: No visual changes. ENT: No sore throat. Cardiovascular: Denies chest pain. Respiratory: Denies shortness of breath. Gastrointestinal: No abdominal pain.  No nausea, no vomiting.  + diarrhea.  No constipation. Genitourinary: Negative for dysuria. Musculoskeletal: Negative for back pain. Skin: Negative for rash. Neurological: Negative for headaches, focal weakness or numbness.  10-point ROS otherwise negative.  ____________________________________________   PHYSICAL EXAM:  VITAL SIGNS: ED Triage  Vitals  Enc Vitals Group     BP 07/30/15 1227 129/64 mmHg     Pulse Rate 07/30/15 1227 58     Resp 07/30/15 1227 18     Temp 07/30/15 1227 97.6 F (36.4 C)     Temp Source 07/30/15 1227 Oral     SpO2 07/30/15 1227 97 %     Weight 07/30/15 1227 283 lb (128.368 kg)     Height 07/30/15 1227 6\' 3"  (1.905 m)     Head Cir --      Peak Flow --      Pain Score  07/30/15 1226 0     Pain Loc --      Pain Edu? --      Excl. in GC? --     Constitutional: Alert and oriented. Chronically ill-appearing but in no acute distress. Eyes: Conjunctivae are slightly icteric. PERRL. EOMI. Head: Atraumatic. Nose: No congestion/rhinnorhea. Mouth/Throat: Mucous membranes are dry.  Oropharynx non-erythematous. Neck: No stridor.  Cardiovascular: Normal rate, regular rhythm. Grossly normal heart sounds.  Good peripheral circulation. Respiratory: Normal respiratory effort.  No retractions. Lungs CTAB. Gastrointestinal: Soft and nontender. No distention. No abdominal bruits. No CVA tenderness. Genitourinary: deferred Rectal:brown stool in rectal vault is strongly guaiac positive Musculoskeletal: No lower extremity tenderness nor edema.  No joint effusions. Neurologic:  Normal speech and language. No gross focal neurologic deficits are appreciated. No gait instability. Skin:  Skin is warm, dry and intact. No rash noted. Psychiatric: Mood and affect are normal. Speech and behavior are normal.  ____________________________________________   LABS (all labs ordered are listed, but only abnormal results are displayed)  Labs Reviewed  COMPREHENSIVE METABOLIC PANEL - Abnormal; Notable for the following:    Sodium 134 (*)    Potassium 7.0 (*)    BUN 90 (*)    Creatinine, Ser 3.59 (*)    Calcium 10.4 (*)    Total Protein 9.4 (*)    AST 108 (*)    ALT 95 (*)    Total Bilirubin 2.0 (*)    GFR calc non Af Amer 20 (*)    GFR calc Af Amer 24 (*)    All other components within normal limits  CBC - Abnormal; Notable for the following:    WBC 10.9 (*)    RDW 16.6 (*)    Platelets 129 (*)    All other components within normal limits  AMMONIA - Abnormal; Notable for the following:    Ammonia 116 (*)    All other components within normal limits  PROTIME-INR - Abnormal; Notable for the following:    Prothrombin Time 16.3 (*)    All other components within normal  limits  APTT  POC OCCULT BLOOD, ED  TYPE AND SCREEN  ABO/RH   ____________________________________________  EKG  ED ECG REPORT I, Gayla DossGayle, Briar Witherspoon A, the attending physician, personally viewed and interpreted this ECG.   Date: 07/30/2015  EKG Time: 13:32  Rate: 63  Rhythm: normal sinus rhythm  Axis: left axis  Intervals:none  ST&T Change: No acute ST elevation  ____________________________________________  RADIOLOGY  CXR IMPRESSION: No active disease.  ____________________________________________   PROCEDURES  Procedure(s) performed: None  Critical Care performed: Yes, see critical care note(s). Total critical care time spent 35 minutes.  ____________________________________________   INITIAL IMPRESSION / ASSESSMENT AND PLAN / ED COURSE  Pertinent labs & imaging results that were available during my care of the patient were reviewed by me and considered in my medical decision making (  see chart for details).  Howard [name] is a 34 y.o. male with history of alcoholic cirrhosis with ascites, hypertension, diabetes, schizophrenia who presents for evaluation of 3 days dark tarry stools. On exam, he is chronically ill-appearing but in no acute distress. Vital signs stable, he is afebrile. He does have dark stool in the rectal vault which is strongly guaiac positive, concerning for upper GI bleed. Labs reviewed and are notable for acute renal failure with creatinine 3.59, also hyperkalemic with potassium 7.0. No acute hyperkalemic EKG change. We'll give IV fluids, calcium gluconate, insulin and glucose as well as albuterol. Will not give Kayexalate given concern for GI bleed. IV Protonix ordered. Hemoglobin 15.1, likely elevated in the setting of  hemoconcentration. Ammonia elevated at 116. Discussed with Dr. Deneise Lever of nephrology. Case discussed with Dr. Allena Katz, hospitalist for admission at 2:30 PM. ____________________________________________   FINAL CLINICAL IMPRESSION(S) / ED  DIAGNOSES  Final diagnoses:  Gastrointestinal hemorrhage with melena  Acute hyperkalemia  Acute renal failure, unspecified acute renal failure type (HCC)      Gayla Doss, MD 07/30/15 1433

## 2015-07-31 ENCOUNTER — Ambulatory Visit: Admission: RE | Admit: 2015-07-31 | Payer: Medicare Other | Source: Ambulatory Visit | Admitting: Gastroenterology

## 2015-07-31 LAB — BASIC METABOLIC PANEL
Anion gap: 10 (ref 5–15)
BUN: 79 mg/dL — AB (ref 6–20)
CHLORIDE: 108 mmol/L (ref 101–111)
CO2: 20 mmol/L — AB (ref 22–32)
Calcium: 9.9 mg/dL (ref 8.9–10.3)
Creatinine, Ser: 2.44 mg/dL — ABNORMAL HIGH (ref 0.61–1.24)
GFR calc non Af Amer: 32 mL/min — ABNORMAL LOW (ref >60)
GFR, EST AFRICAN AMERICAN: 38 mL/min — AB (ref >60)
Glucose, Bld: 101 mg/dL — ABNORMAL HIGH (ref 65–99)
POTASSIUM: 5.4 mmol/L — AB (ref 3.5–5.1)
SODIUM: 138 mmol/L (ref 135–145)

## 2015-07-31 LAB — URINALYSIS COMPLETE WITH MICROSCOPIC (ARMC ONLY)
BILIRUBIN URINE: NEGATIVE
Bacteria, UA: NONE SEEN
Glucose, UA: NEGATIVE mg/dL
KETONES UR: NEGATIVE mg/dL
LEUKOCYTES UA: NEGATIVE
Nitrite: NEGATIVE
PH: 7 (ref 5.0–8.0)
PROTEIN: NEGATIVE mg/dL
SPECIFIC GRAVITY, URINE: 1.015 (ref 1.005–1.030)
Squamous Epithelial / LPF: NONE SEEN

## 2015-07-31 LAB — AMMONIA: Ammonia: 186 umol/L — ABNORMAL HIGH (ref 9–35)

## 2015-07-31 LAB — GLUCOSE, CAPILLARY
GLUCOSE-CAPILLARY: 98 mg/dL (ref 65–99)
Glucose-Capillary: 111 mg/dL — ABNORMAL HIGH (ref 65–99)
Glucose-Capillary: 99 mg/dL (ref 65–99)
Glucose-Capillary: 82 mg/dL (ref 65–99)

## 2015-07-31 LAB — CBC
HCT: 39.7 % — ABNORMAL LOW (ref 40.0–52.0)
Hemoglobin: 12.9 g/dL — ABNORMAL LOW (ref 13.0–18.0)
MCH: 31.6 pg (ref 26.0–34.0)
MCHC: 32.4 g/dL (ref 32.0–36.0)
MCV: 97.5 fL (ref 80.0–100.0)
PLATELETS: 91 10*3/uL — AB (ref 150–440)
RBC: 4.07 MIL/uL — ABNORMAL LOW (ref 4.40–5.90)
RDW: 16.3 % — AB (ref 11.5–14.5)
WBC: 8.3 10*3/uL (ref 3.8–10.6)

## 2015-07-31 MED ORDER — CITALOPRAM HYDROBROMIDE 20 MG PO TABS
20.0000 mg | ORAL_TABLET | Freq: Every day | ORAL | Status: DC
  Administered 2015-07-31 – 2015-08-04 (×5): 20 mg via ORAL
  Filled 2015-07-31 (×5): qty 1

## 2015-07-31 MED ORDER — RIFAXIMIN 550 MG PO TABS
550.0000 mg | ORAL_TABLET | Freq: Two times a day (BID) | ORAL | Status: DC
  Administered 2015-07-31 – 2015-08-04 (×7): 550 mg via ORAL
  Filled 2015-07-31 (×7): qty 1

## 2015-07-31 MED ORDER — LACTULOSE 10 GM/15ML PO SOLN
20.0000 g | Freq: Three times a day (TID) | ORAL | Status: DC
  Administered 2015-07-31 – 2015-08-02 (×6): 20 g via ORAL
  Filled 2015-07-31 (×6): qty 30

## 2015-07-31 NOTE — Consult Note (Signed)
[facility]  CONSULT NOTE    Date: 07/31/2015                  Patient Name:  Howard [name]  MRN: [id number]  DOB: 09-16-1981  Age / Sex: 34 y.o., male         PCP: Imelda Pillow, NP                 Service Requesting Consult: Dr. Enedina Finner                 Reason for Consult: Acute renal failure with hyperkalemia            History of Present Illness: Howard [name] is a 34 y.o. white male with schizophrenia, hyperlipidemia, depression, alcohol abuse, alcoholic liver disease, diabetes mellitus type II, alcoholic cardiomyopathy who lives with his mother, who was admitted to [facility] on 07/30/2015 for Acute hyperkalemia [E87.5] Gastrointestinal hemorrhage with melena [K92.1] Acute renal failure, unspecified acute renal failure type (HCC) [N17.9]  History taken from mother. Patient is a poor historian.  Patient was scheduled for a endoscopy yesterday but showed up later than his appointment. He was vomiting coffee ground emesis. He was brought to the ER where he was found to be in acute renal failure with hyperkalemia, creatinine of 3.59 with K of 7. Baseline creatinine of 0.98 from 07/08/15. No EKG changes.  Home regimen includes losartan and spironolactone. Denies potassium supplements.  Patient was treated with calcium gluconate, albuterol, insulin and IV fluids: NS at 147mL/hr.  Foley catheter placed and urine output of since admission.   Patient quit alcohol approximately 2 months ago.   Medications: Outpatient medications: Prescriptions prior to admission  Medication Sig Dispense Refill Last Dose  . albuterol (PROVENTIL HFA;VENTOLIN HFA) 108 (90 BASE) MCG/ACT inhaler Inhale 2 puffs into the lungs every 6 (six) hours as needed for wheezing or shortness of breath.    PRN at PRN  . aspirin EC 81 MG tablet Take 81 mg by mouth daily.   unknown at unknown  . citalopram (CELEXA) 20 MG tablet Take 20 mg by mouth daily.   unknown at unknown  . clobetasol  cream (TEMOVATE) 0.05 % Apply 1 application topically as needed (for skin irritation).   PRN at PRN  . diphenhydrAMINE (BENADRYL) 25 mg capsule Take 50 mg by mouth at bedtime as needed for sleep.   PRN at PRN  . furosemide (LASIX) 20 MG tablet Take 1 tablet (20 mg total) by mouth 2 (two) times daily. 60 tablet 0 unknown at unknown  . losartan-hydrochlorothiazide (HYZAAR) 100-25 MG per tablet Take 1 tablet by mouth daily.   unknown at unknown  . metFORMIN (GLUCOPHAGE) 500 MG tablet Take 1 tablet (500 mg total) by mouth 2 (two) times daily with a meal. 60 tablet 3 unknown at unknown  . metoprolol succinate (TOPROL-XL) 100 MG 24 hr tablet Take 100 mg by mouth daily.    unknown at unknown  . OLANZapine (ZYPREXA) 15 MG tablet Take 15 mg by mouth at bedtime.   unknown at unknown  . omeprazole (PRILOSEC) 40 MG capsule Take 40 mg by mouth daily.    unknown at unknown  . simvastatin (ZOCOR) 40 MG tablet Take 40 mg by mouth at bedtime.    unknown at unknown  . spironolactone (ALDACTONE) 100 MG tablet Take 1 tablet (100 mg total) by mouth daily. 30 tablet 0 unknown at unknown  . cefUROXime (CEFTIN) 250 MG tablet  Take 1 tablet (250 mg total) by mouth 2 (two) times daily with a meal. (Patient not taking: Reported on 07/30/2015) 10 tablet 0 Taking    Current medications: Current Facility-Administered Medications  Medication Dose Route Frequency Provider Last Rate Last Dose  . 0.9 %  sodium chloride infusion   Intravenous Continuous Enedina Finner, MD 100 mL/hr at 07/31/15 0546    . albuterol (PROVENTIL) (2.5 MG/3ML) 0.083% nebulizer solution 2.5 mg  2.5 mg Inhalation Q6H PRN Enedina Finner, MD      . docusate sodium (COLACE) capsule 100 mg  100 mg Oral BID Enedina Finner, MD   100 mg at 07/30/15 2221  . insulin aspart (novoLOG) injection 0-9 Units  0-9 Units Subcutaneous TID WC Enedina Finner, MD   0 Units at 07/30/15 1737  . multivitamin with minerals tablet 1 tablet  1 tablet Oral Daily Enedina Finner, MD   1 tablet at 07/30/15  1811  . OLANZapine (ZYPREXA) tablet 15 mg  15 mg Oral QHS Enedina Finner, MD   15 mg at 07/30/15 2219  . ondansetron (ZOFRAN) tablet 4 mg  4 mg Oral Q6H PRN Enedina Finner, MD       Or  . ondansetron (ZOFRAN) injection 4 mg  4 mg Intravenous Q6H PRN Enedina Finner, MD      . oxyCODONE (Oxy IR/ROXICODONE) immediate release tablet 5 mg  5 mg Oral Q4H PRN Enedina Finner, MD      . pantoprazole (PROTONIX) injection 40 mg  40 mg Intravenous Q12H Enedina Finner, MD   40 mg at 07/30/15 2217  . senna-docusate (Senokot-S) tablet 1 tablet  1 tablet Oral QHS PRN Enedina Finner, MD      . simvastatin (ZOCOR) tablet 40 mg  40 mg Oral QHS Enedina Finner, MD   40 mg at 07/30/15 2221      Allergies: No Known Allergies    Past Medical History: Past Medical History  Diagnosis Date  . Hypertension   . Schizophrenia (HCC)   . Hyperlipemia   . Depression   . Diabetes (HCC)   . Liver disease   . Diabetes mellitus, type II (HCC)   . Heart disease   . Cirrhosis of liver with ascites [facility])      Past Surgical History: Past Surgical History  Procedure Laterality Date  . No past surgeries       Family History: Family History  Problem Relation Age of Onset  . Heart disease Mother   . Hypertension Mother   . Hyperlipidemia Mother   . Stroke Father   . Heart attack Father   . Hypertension Father   . Heart disease Brother      Social History: Social History   Social History  . Marital Status: Single    Spouse Name: N/A  . Number of Children: N/A  . Years of Education: N/A   Occupational History  . Not on file.   Social History Main Topics  . Smoking status: Never Smoker   . Smokeless tobacco: Never Used  . Alcohol Use: No     Comment: states quit drinking 1 month ago.  . Drug Use: No  . Sexual Activity: No   Other Topics Concern  . Not on file   Social History Narrative   ** Merged History Encounter **         Review of Systems: Review of Systems  Constitutional: Positive for weight loss and  malaise/fatigue. Negative for fever and chills.  HENT: Negative for congestion, ear  discharge, ear pain, hearing loss, nosebleeds, sore throat and tinnitus.   Eyes: Negative for blurred vision, double vision, photophobia, pain, discharge and redness.  Respiratory: Negative.  Negative for cough, hemoptysis, sputum production, shortness of breath, wheezing and stridor.   Cardiovascular: Negative.  Negative for chest pain, palpitations, orthopnea, claudication, leg swelling and PND.  Gastrointestinal: Positive for nausea, vomiting, diarrhea, blood in stool and melena. Negative for abdominal pain and constipation.  Genitourinary: Negative.  Negative for dysuria, urgency, frequency, hematuria and flank pain.  Musculoskeletal: Negative.  Negative for myalgias, back pain, joint pain, falls and neck pain.  Skin: Negative for itching and rash.  Neurological: Positive for dizziness and weakness. Negative for tingling, tremors, sensory change, speech change, focal weakness, seizures, loss of consciousness and headaches.  Endo/Heme/Allergies: Negative for environmental allergies and polydipsia. Does not bruise/bleed easily.  Psychiatric/Behavioral: Positive for depression and substance abuse. Negative for suicidal ideas, hallucinations and memory loss. The patient is nervous/anxious and has insomnia.     Vital Signs: Blood pressure 127/59, pulse 69, temperature 98.2 F (36.8 C), temperature source Oral, resp. rate 16, height 6\' 3"  (1.905 m), weight 128.368 kg (283 lb), SpO2 91 %.  Weight trends: Filed Weights   07/30/15 1227  Weight: 128.368 kg (283 lb)    Physical Exam: General: NAD  Head: Normocephalic, atraumatic. Dry oral mucosal membranes  Eyes: Anicteric, PERRL  Neck: Supple, trachea midline  Lungs:  Clear to auscultation  Heart: Regular rate and rhythm  Abdomen:  Soft, nontender,   Extremities: trace peripheral edema.  Neurologic: Nonfocal, moving all four extremities  Skin: No lesions   GU: Foley with clear yellow urine     Lab results: Basic Metabolic Panel:  Recent Labs Lab 07/30/15 1237 07/30/15 1934 07/31/15 0520  NA 134* 138 138  K 7.0* 5.2* 5.4*  CL 102 107 108  CO2 22 18* 20*  GLUCOSE 93 76 101*  BUN 90* 81* 79*  CREATININE 3.59* 2.98* 2.44*  CALCIUM 10.4* 9.8 9.9  MG  --  1.6*  --   PHOS  --  6.2*  --     Liver Function Tests:  Recent Labs Lab 07/30/15 1237  AST 108*  ALT 95*  ALKPHOS 80  BILITOT 2.0*  PROT 9.4*  ALBUMIN 4.1   No results for input(s): LIPASE, AMYLASE in the last 168 hours.  Recent Labs Lab 07/30/15 1237  AMMONIA 116*    CBC:  Recent Labs Lab 07/30/15 1237  WBC 10.9*  HGB 15.1  HCT 44.9  MCV 97.0  PLT 129*    Cardiac Enzymes: No results for input(s): CKTOTAL, CKMB, CKMBINDEX, TROPONINI in the last 168 hours.  BNP: Invalid input(s): POCBNP  CBG:  Recent Labs Lab 07/30/15 1733 07/30/15 1814 07/30/15 2114 07/31/15 0724  GLUCAP 56* 90 82 98    Microbiology: Results for orders placed or performed during the hospital encounter of 07/04/15  Body fluid culture     Status: None   Collection Time: 07/04/15  8:41 PM  Result Value Ref Range Status   Specimen Description ABDOMEN  Final   Special Requests NONE  Final   Gram Stain RARE WBC SEEN NO ORGANISMS SEEN   Final   Culture NO GROWTH 4 DAYS  Final   Report Status 07/09/2015 FINAL  Final    Coagulation Studies:  Recent Labs  07/30/15 1238  LABPROT 16.3*  INR 1.29    Urinalysis: No results for input(s): COLORURINE, LABSPEC, PHURINE, GLUCOSEU, HGBUR, BILIRUBINUR, KETONESUR, PROTEINUR, UROBILINOGEN, NITRITE, LEUKOCYTESUR  in the last 72 hours.  Invalid input(s): APPERANCEUR    Imaging: Dg Chest Portable 1 View  07/30/2015  CLINICAL DATA:  Black tarry stools for 2 days, history of heart disease, nonsmoker EXAM: PORTABLE CHEST 1 VIEW COMPARISON:  07/04/2015 FINDINGS: The heart size and mediastinal contours are within normal limits. Both  lungs are clear. The visualized skeletal structures are unremarkable. IMPRESSION: No active disease. Electronically Signed   By: Esperanza Heir M.D.   On: 07/30/2015 14:03      Assessment & Plan: Howard [name] is a 34 y.o. white male with schizophrenia, hyperlipidemia, depression, alcohol abuse, alcoholic liver disease, diabetes mellitus type II, alcoholic cardiomyopathy who lives with his mother, who was admitted to [facility] on 07/30/2015 for acute renal failure with hyperkalemia.   1. Acute renal failure with hyperkalemia: Baseline creatinine of 0.98 on 07/08/15. Acute renal failure from hypotension and acute blood loss. Nonoliguric urine output - Creatinine improving to 2.44 today with IV fluids and supportive measures.  - Potassium improved to 5.4. Holding spironolactone and losartan.  - Continue to hold diuretics: home regimen of spironolactone, furosemide and hydrochlorothiazide.  - Renally dose all medications.  - Continue to monitor volume status, electrolytes and urine output.   2. 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  - continue glucose control - repeat urinalysis and spot urine protein to creatinine ratio. Check SPEP and UPEP  3. Acute blood loss anemia: with GI bleed. Hemoglobin healthy at 15.1 - consult gastroenterology.    LOS: 1 Howard [name] 11/8/201610:00 AM

## 2015-07-31 NOTE — Consult Note (Signed)
Pt with hx of schizophrenia and ascites with liver disease who is now admitted for dehydration and acute kidney problems.  He may have had some GI bleeding but would observe him for now until that is decided.  I suspect given his schizophrenia he may have taken more diuretic pills than supposed to. May need EGD and possible colonoscopy but want to wait and see if kidney function better.

## 2015-07-31 NOTE — Progress Notes (Signed)
[facility]  Patient Status: active [facility] is providing the following services: SN/PT  If patient discharges after hours, please call [phone].   Howard CaseyJason E [name] 07/31/2015, 4:56 PM

## 2015-07-31 NOTE — Care Management Important Message (Signed)
Important Message  Patient Details  Name: Howard LinemanJoey A Bahner MRN: [id number] Date of Birth: Mar 15, 1981   Medicare Important Message Given:  Yes-second notification given    Adonis HugueninBerkhead, Chayse Gracey L, RN 07/31/2015, 11:45 AM

## 2015-07-31 NOTE — Progress Notes (Signed)
Per Dr. Shelle Ironein pt to have 2-3 stools in a 24 hr period.  If pt has more or less than that then the RN needs to notify the MD to adjust the lactulose dose.  This information will be reported to the oncoming RN.  Will continue to monitor.

## 2015-07-31 NOTE — Care Management (Deleted)
Spoke with patient who is alert and oriented, pleasant. From home with daughter. Stated has cane and walker but rarely uses. Stated amb[place] to the neighborhood market and back daily at about 1/4 mile without her walker. Independent. Has home health aid to assist with household chores. Family provides assistance with transportation. No CM needs identified.           

## 2015-07-31 NOTE — Consult Note (Signed)
GI Inpatient Consult Note  Reason for Consult:   Attending Requesting Consult:  History of Present Illness: Howard [name] is a 34 y.o. male with a significant history of hypertension, diabetes, schizophrenia, and alcohol abuse, recent diagnosis of  Cirrhosis presented to the emergency department with generalized weakness, coffee ground emesis, black tarry stools.  He is well known to our office, during his previous hospitalization on July 05, 2015 our office did participate in his care.  That hospitalization included abdominal distention for 3-4 weeks dyspnea on exertion and vomiting.  At that time he had been drinking 1 pt of vodka daily for the last few years.  He had paracentesis with remove foreign 0.5 L of fluid.  Of note during that visit he was involuntary committed to to hallucinations and hearing voices stating he should harm himself.  She had  It was recommended to increase spironolactone to 100 mg and Lasix to 40 mg daily and schedule outpatient follow-up to monitor renal function and potassium.  He followed up in our office on July 19, 2015 with Dr. Shelle Iron.  At that time he had lost 24 lb since hospital visit, was abstaining from alcohol, was taking Lasix 20 mg b.i.d. And spironolactone 100 mg daily, trying to stay on low sodium diet.   Labs were scheduled during that visit along with an upper endoscopy due to cirrhosis and ascites.  Since that visit he reports he was supposed to have his upper endoscopy yesterday, unfortunately arrived to late for it to be completed.  His mother is present in the room and provides a significant amount of the history.  She reports that he had multiple episodes of vomiting on Saturday night, reports they were coffee-ground in appearance, has not seen any evidence of vomiting since then.  He has had soft black stools for the last 4 days, 1 stool a day, heme-positive in the emergency department.  He is on oxygen 24 hours a day, 2 L, reports he stays  short of breath all the time.  Has a home health care nurse that takes care of the oxygen meats.  Mother reports that he has not been eating and drinking very much, seems sleepy year than normal, although denies confusion.  He denies any current nausea, does not feel as if his abdomen is distended, does not do daily weights.  His labs from our office included a negative hep C, negative hep B surface antigen, negative ASMA, negative ANA, positive mitochondrial Ab (39.9).  His BUN/creatinine on October 16 was 22/0.98, 12 days ago 51/1.9, on admission, 90 / 3.59, currently 79/2.44.  His potassium was 5.4 12 days ago, 7.2 on admission, currently 5.2.  Past Medical History:  Past Medical History  Diagnosis Date  . Hypertension   . Schizophrenia (HCC)   . Hyperlipemia   . Depression   . Diabetes (HCC)   . Liver disease   . Diabetes mellitus, type II (HCC)   . Heart disease   . Cirrhosis of liver with ascites (HCC)     Problem List: Patient Active Problem List   Diagnosis Date Noted  . Acute renal failure (ARF) (HCC) 07/30/2015  . Hypoxia 07/04/2015  . Ascites 07/04/2015  . Elevated transaminase level 07/04/2015  . DOE (dyspnea on exertion) 07/04/2015  . Alcohol abuse 05/31/2015  . Hypertension 05/29/2015  . Paranoid schizophrenia (HCC) 03/20/2015    Past Surgical History: Past Surgical History  Procedure Laterality Date  . No past surgeries  Allergies: No Known Allergies  Home Medications: Prescriptions prior to admission  Medication Sig Dispense Refill Last Dose  . albuterol (PROVENTIL HFA;VENTOLIN HFA) 108 (90 BASE) MCG/ACT inhaler Inhale 2 puffs into the lungs every 6 (six) hours as needed for wheezing or shortness of breath.    PRN at PRN  . aspirin EC 81 MG tablet Take 81 mg by mouth daily.   unknown at unknown  . citalopram (CELEXA) 20 MG tablet Take 20 mg by mouth daily.   unknown at unknown  . clobetasol cream (TEMOVATE) 0.05 % Apply 1 application topically as  needed (for skin irritation).   PRN at PRN  . diphenhydrAMINE (BENADRYL) 25 mg capsule Take 50 mg by mouth at bedtime as needed for sleep.   PRN at PRN  . furosemide (LASIX) 20 MG tablet Take 1 tablet (20 mg total) by mouth 2 (two) times daily. 60 tablet 0 unknown at unknown  . losartan-hydrochlorothiazide (HYZAAR) 100-25 MG per tablet Take 1 tablet by mouth daily.   unknown at unknown  . metFORMIN (GLUCOPHAGE) 500 MG tablet Take 1 tablet (500 mg total) by mouth 2 (two) times daily with a meal. 60 tablet 3 unknown at unknown  . metoprolol succinate (TOPROL-XL) 100 MG 24 hr tablet Take 100 mg by mouth daily.    unknown at unknown  . OLANZapine (ZYPREXA) 15 MG tablet Take 15 mg by mouth at bedtime.   unknown at unknown  . omeprazole (PRILOSEC) 40 MG capsule Take 40 mg by mouth daily.    unknown at unknown  . simvastatin (ZOCOR) 40 MG tablet Take 40 mg by mouth at bedtime.    unknown at unknown  . spironolactone (ALDACTONE) 100 MG tablet Take 1 tablet (100 mg total) by mouth daily. 30 tablet 0 unknown at unknown  . cefUROXime (CEFTIN) 250 MG tablet Take 1 tablet (250 mg total) by mouth 2 (two) times daily with a meal. (Patient not taking: Reported on 07/30/2015) 10 tablet 0 Taking   Home medication reconciliation was completed with the patient.   Scheduled Inpatient Medications:   . docusate sodium  100 mg Oral BID  . insulin aspart  0-9 Units Subcutaneous TID WC  . multivitamin with minerals  1 tablet Oral Daily  . OLANZapine  15 mg Oral QHS  . pantoprazole (PROTONIX) IV  40 mg Intravenous Q12H  . simvastatin  40 mg Oral QHS    Continuous Inpatient Infusions:   . sodium chloride 100 mL/hr at 07/31/15 0546    PRN Inpatient Medications:  albuterol, ondansetron **OR** ondansetron (ZOFRAN) IV, oxyCODONE, senna-docusate  Family History: family history includes Heart attack in his father; Heart disease in his brother and mother; Hyperlipidemia in his mother; Hypertension in his father and  mother; Stroke in his father.    Social History:   reports that he has never smoked. He has never used smokeless tobacco. He reports that he does not drink alcohol or use illicit drugs.   Review of Systems: Constitutional: Weight is stable.  Eyes: No changes in vision. ENT: No oral lesions, sore throat.  GI: see HPI.  Heme/Lymph: No easy bruising.  CV: No chest pain.  GU: No hematuria.  Integumentary: No rashes.  Neuro: No headaches.  Psych: No depression/anxiety.  Endocrine: No heat/cold intolerance.  Allergic/Immunologic: No urticaria.  Resp: No cough.  Musculoskeletal: No joint swelling.    Physical Examination: BP 109/56 mmHg  Pulse 71  Temp(Src) 98.4 F (36.9 C) (Oral)  Resp 18  Ht 6\' 3"  (  1.905 m)  Wt 128.368 kg (283 lb)  BMI 35.37 kg/m2  SpO2 95% Gen: NAD, alert and oriented x 4, was able to answer my questions HEENT: PEERLA, EOMI, Neck: supple, no JVD or thyromegaly Chest: shallow breathing noted CV: RRR, no m/g/c/r Abd: soft, obese, NT, ND, +BS in all four quadrants; no HSM, guarding, ridigity, or rebound tenderness Ext: no edema, well perfused with 2+ pulses, Skin: no rash or lesions noted Lymph: no LAD  Data: Lab Results  Component Value Date   WBC 10.9* 07/30/2015   HGB 15.1 07/30/2015   HCT 44.9 07/30/2015   MCV 97.0 07/30/2015   PLT 129* 07/30/2015    Recent Labs Lab 07/30/15 1237  HGB 15.1   Lab Results  Component Value Date   NA 138 07/31/2015   K 5.4* 07/31/2015   CL 108 07/31/2015   CO2 20* 07/31/2015   BUN 79* 07/31/2015   CREATININE 2.44* 07/31/2015   Lab Results  Component Value Date   ALT 95* 07/30/2015   AST 108* 07/30/2015   ALKPHOS 80 07/30/2015   BILITOT 2.0* 07/30/2015    Recent Labs Lab 07/30/15 1238  APTT 36  INR 1.29   Imaging: CLINICAL DATA: Hepatic cirrhosis.  EXAM: US ABDOMEN LIMITED - RIGHT UPPER QUADRANT  COMPARISON: July 05, 2015.  FINDINGS: Gallbladder:  No gallstones or wall  thickening visualized. No sonographic Murphy sign noted.  Common bile duct:  Diameter: 3.8 mm which is within normal limits. However, visualization is limited due to body habitus.  Liver:  Difficult to visualize due to body habitus. Increased echogenicity may be present suggesting fatty infiltration or diffuse hepatocellular disease. Focal lesion cannot be excluded on the basis of this exam.  IMPRESSION: Exam is significantly limited due to body habitus. No definite evidence of gallstones or biliary dilatation is noted. Increased echogenicity of hepatic parenchyma is noted suggesting fatty infiltration or diffuse hepatocellular disease. Focal lesion cannot be excluded due to the limited imaging.   Electronically Signed  By: Lupita Raider, M.D.  On: 07/06/2015 11:51  CLINICAL DATA: Shortness of breath/hypoxia  EXAM: CT ANGIOGRAPHY CHEST WITH CONTRAST  TECHNIQUE: Multidetector CT imaging of the chest was performed using the standard protocol during bolus administration of intravenous contrast. Multiplanar CT image reconstructions and MIPs were obtained to evaluate the vascular anatomy.  CONTRAST: OMNIPAQUE IOHEXOL 350 MG/ML SOLN  COMPARISON: Chest radiograph July 04, 2015  FINDINGS: There is no demonstrable pulmonary embolus. There is no thoracic aortic aneurysm or dissection. The visualized great vessels appear unremarkable.  There is a sizable free-flowing effusion on the right. There is consolidation in the right lower lobe with volume loss. There is patchy consolidation in the left lower lobe with atelectatic change intermingled in the left lower lobe and inferior lingula.  There is a nodular lesion in the right lobe of the thyroid measuring 1.9 x 1.5 cm.  There is no appreciable thoracic adenopathy. There is moderate mediastinal fat.  Pericardium is not thickened.  In the visualized upper abdomen, there is hepatic  steatosis. Visualized spleen is enlarged measuring 19.9 by 11.8 cm.  There are no blastic or lytic bone lesions.  Review of the MIP images confirms the above findings.  IMPRESSION: No demonstrable pulmonary embolus.  Sizable right pleural effusion with airspace consolidation in both lower lobes. There is also patchy atelectasis in the left base and inferior lingula.  No appreciable adenopathy.  Nodular lesion in the right lobe of the thyroid. Consider further evaluation with thyroid  ultrasound. If patient is clinically hyperthyroid, consider nuclear medicine thyroid uptake and scan.  Splenomegaly. Note that spleen is incompletely visualized.  Hepatic steatosis.   Electronically Signed  By: Bretta Bang III M.D.  On: 07/05/2015 10:39 Assessment/Plan: Howard [name] is a 34 y.o. male with recent diagnosis of cirrhosis, pmh of htn, dm, Schizophrenia and alcohol abuse admitted for weakness and AKI.  His hgb on admission was 15.1, with platelets of 129.  BUN/creatinine on admission 90/3.59, currently 79/2.44.  Patient was taking lasix 40 mg and spiro 100 mg daily along with HCTZ.  He had poor oral intake.  Has been avoiding EtOh.  Being treated with Protonix 40 mg BID,  Nephrology consult recommended holding lasix, spiro and HCTZ, continue with IV fluids and supportive measures.  Recommendations: We will proceed with upper EGD when clinically feasible.  Please see Dr. Earnest Conroy note for further recommendations. Thank you for the consult. Please call with questions or concerns.  Carney Harder, PA-C  I personally performed these services.

## 2015-07-31 NOTE — Progress Notes (Signed)
[facility] - [place] at [facility]   PATIENT NAME: Howard [name]    MR#:  [id number]  DATE OF BIRTH:  1981-09-22  SUBJECTIVE:  CHIEF COMPLAINT:   Chief Complaint  Patient presents with  . GI Bleeding   -Patient was recently admitted to the hospital last month for new onset alcoholic liver cirrhosis and also ascites and had paracentesis done at that time. Seen GI as outpatient and was scheduled to get EGD yesterday. -He was also discharged on oxygen, Lasix and Aldactone. His appetite has been low over the last few days and presented to the hospital for weakness and noted to have acute renal failure and hyperkalemia. Continues to feel weak, getting IV fluids, urine output is better. Renal function improving.  REVIEW OF SYSTEMS:  Review of Systems  Constitutional: Positive for malaise/fatigue. Negative for fever and chills.  HENT: Negative for ear discharge, ear pain and nosebleeds.   Eyes: Negative for blurred vision and double vision.  Respiratory: Negative for cough, shortness of breath and wheezing.   Cardiovascular: Negative for chest pain and palpitations.  Gastrointestinal: Negative for nausea, vomiting, abdominal pain, diarrhea and constipation.  Genitourinary: Negative for dysuria.  Neurological: Positive for tremors and weakness. Negative for dizziness, sensory change, speech change, focal weakness, seizures and headaches.  Psychiatric/Behavioral: Negative for depression.    DRUG ALLERGIES:  No Known Allergies  VITALS:  Blood pressure 109/56, pulse 71, temperature 98.4 F (36.9 C), temperature source Oral, resp. rate 18, height  (1.905 m), weight 128.368 kg (283 lb), SpO2 95 %.  PHYSICAL EXAMINATION:  Physical Exam  GENERAL:  34 y.o.-year-old obese patient lying in the bed with no acute distress.  EYES: Pupils equal, round, reactive to light and accommodation. No scleral icterus. Extraocular muscles intact.  HEENT: Head atraumatic,  normocephalic. Oropharynx and nasopharynx clear.  NECK:  Supple, no jugular venous distention. No thyroid enlargement, no tenderness.  LUNGS: Normal breath sounds bilaterally, no wheezing, rales,rhonchi or crepitation. No use of accessory muscles of respiration.  CARDIOVASCULAR: S1, S2 normal. No murmurs, rubs, or gallops.  ABDOMEN: Soft, nontender, nondistended. Bowel sounds present. No organomegaly or mass.  EXTREMITIES: No pedal edema, cyanosis, or clubbing.  NEUROLOGIC: Cranial nerves II through XII are intact. Muscle strength 4/5 in all extremities with generalized weakness. Sensation intact. Gait not checked. Significant flapping tremors present in exam PSYCHIATRIC: The patient is alert and oriented x 3. Appears very weak SKIN: No obvious rash, lesion, or ulcer.    LABORATORY PANEL:   CBC  Recent Labs Lab 07/31/15 1430  WBC 8.3  HGB 12.9*  HCT 39.7*  PLT 91*   ------------------------------------------------------------------------------------------------------------------  Chemistries   Recent Labs Lab 07/30/15 1237 07/30/15 1934 07/31/15 0520  NA 134* 138 138  K 7.0* 5.2* 5.4*  CL 102 107 108  CO2 22 18* 20*  GLUCOSE 93 76 101*  BUN 90* 81* 79*  CREATININE 3.59* 2.98* 2.44*  CALCIUM 10.4* 9.8 9.9  MG  --  1.6*  --   AST 108*  --   --   ALT 95*  --   --   ALKPHOS 80  --   --   BILITOT 2.0*  --   --    ------------------------------------------------------------------------------------------------------------------  Cardiac Enzymes No results for input(s): TROPONINI in the last 168 hours. ------------------------------------------------------------------------------------------------------------------  RADIOLOGY:  Dg Chest Portable 1 View  07/30/2015  CLINICAL DATA:  Black tarry stools for 2 days, history of heart disease, nonsmoker EXAM: PORTABLE CHEST 1 VIEW COMPARISON:  07/04/2015 FINDINGS: The heart size and mediastinal contours are within normal  limits. Both lungs are clear. The visualized skeletal structures are unremarkable. IMPRESSION: No active disease. Electronically Signed   By: Esperanza Heiraymond  Rubner M.D.   On: 07/30/2015 14:03    EKG:   Orders placed or performed during the hospital encounter of 07/30/15  . ED EKG  . ED EKG    ASSESSMENT AND PLAN:   34 year old male with past medical history significant for hypertension, depression, schizophrenia and alcohol abuse and recent admission in October 2016 for alcoholic liver disease with ascites and was discharged presents to the hospital secondary to weakness and coffee-ground emesis.  #1 acute renal failure with hyperkalemia-likely prerenal and ATN -Potassium was corrected and improved to 5.4 today. -Hold his Aldactone, losartan and also Lasix. -Getting IV fluids, urine output is improved and renal function improved from 3.5-2.4. Baseline is at 0.9 from last month. -Appreciate nephrology consult. Hold other nephrotoxins  #2 hematemesis and melena-hemoglobin is stable. -Seen by Dr. Shelle Ironein in the clinic 2 weeks ago and was scheduled to have EGD done yesterday. -Recheck hemoglobin. -hb dropped from yesterday due to hemodilution -GI consulted -Continue Protonix -We'll check with GI about adding octreotide.  #3 liver cirrhosis-alcoholic liver cirrhosis. Diagnosed last month. -Ascites was drained, greater than 4.5 L last month. -Hold Lasix and Aldactone at this time. -Worsening tremors noted on exam-ammonia is ordered. -Start lactulose and rifaximin and also follow-up ammonia tomorrow a.m.  #4 schizophrenia-seen by psychiatric last admission. -Continue Zyprexa -Also continue Celexa  #5 DVT prophylaxis-Ted's and SCDs. -We will hold heparin products due to his GI bleed and thrombocytopenia    All the records are reviewed and case discussed with Care Management/Social Workerr. Management plans discussed with the patient, family and they are in agreement.  CODE STATUS: Full  code  TOTAL TIME TAKING CARE OF THIS PATIENT: 37 minutes.   POSSIBLE D/C IN 2-3 DAYS, DEPENDING ON CLINICAL CONDITION.   Enid BaasKALISETTI,Mubarak Bevens M.D on 07/31/2015 at 4:33 PM  Between 7am to 6pm - Pager - [phone]  After 6pm go to [email or web address] - password [email or web address] [facility]  [facility]  Office  [phone]  CC: Primary care physician; Imelda PillowHOLLAND, CHELSA, NP

## 2015-07-31 NOTE — Care Management (Signed)
Spoke with patient and mother. Patient resides with mother and brother and is receiving home health RN Aid and PT from [facility]. His O2 is provided by [facility] as well. Patient needs PT consult. Has walker at home. Mother takes to appointments. Denies issues with mediciations. Continue to follow.

## 2015-08-01 ENCOUNTER — Inpatient Hospital Stay: Payer: Medicare Other

## 2015-08-01 LAB — AMMONIA: AMMONIA: 100 umol/L — AB (ref 9–35)

## 2015-08-01 LAB — GLUCOSE, CAPILLARY
GLUCOSE-CAPILLARY: 96 mg/dL (ref 65–99)
GLUCOSE-CAPILLARY: 107 mg/dL — AB (ref 65–99)
GLUCOSE-CAPILLARY: 100 mg/dL — AB (ref 65–99)
Glucose-Capillary: 103 mg/dL — ABNORMAL HIGH (ref 65–99)
Glucose-Capillary: 97 mg/dL (ref 65–99)

## 2015-08-01 LAB — COMPREHENSIVE METABOLIC PANEL
ALBUMIN: 3.4 g/dL — AB (ref 3.5–5.0)
ALK PHOS: 76 U/L (ref 38–126)
ALT: 94 U/L — ABNORMAL HIGH (ref 17–63)
ANION GAP: 7 (ref 5–15)
AST: 105 U/L — ABNORMAL HIGH (ref 15–41)
BUN: 49 mg/dL — AB (ref 6–20)
CHLORIDE: 113 mmol/L — AB (ref 101–111)
CO2: 20 mmol/L — ABNORMAL LOW (ref 22–32)
Calcium: 10 mg/dL (ref 8.9–10.3)
Creatinine, Ser: 1.45 mg/dL — ABNORMAL HIGH (ref 0.61–1.24)
GFR calc non Af Amer: >60 mL/min (ref >60)
GLUCOSE: 96 mg/dL (ref 65–99)
POTASSIUM: 4.6 mmol/L (ref 3.5–5.1)
SODIUM: 140 mmol/L (ref 135–145)
Total Bilirubin: 1.7 mg/dL — ABNORMAL HIGH (ref 0.3–1.2)
Total Protein: 8.1 g/dL (ref 6.5–8.1)

## 2015-08-01 LAB — CBC
HCT: 40.2 % (ref 40.0–52.0)
Hemoglobin: 13.4 g/dL (ref 13.0–18.0)
MCH: 32.7 pg (ref 26.0–34.0)
MCHC: 33.4 g/dL (ref 32.0–36.0)
MCV: 97.9 fL (ref 80.0–100.0)
PLATELETS: 79 10*3/uL — AB (ref 150–440)
RBC: 4.11 MIL/uL — ABNORMAL LOW (ref 4.40–5.90)
RDW: 16.2 % — AB (ref 11.5–14.5)
WBC: 7.9 10*3/uL (ref 3.8–10.6)

## 2015-08-01 LAB — PROTEIN ELECTROPHORESIS, SERUM
A/G Ratio: 0.7 (ref 0.7–1.7)
ALBUMIN ELP: 3 g/dL (ref 2.9–4.4)
Alpha-1-Globulin: 0.3 g/dL (ref 0.0–0.4)
Alpha-2-Globulin: 0.7 g/dL (ref 0.4–1.0)
BETA GLOBULIN: 1.4 g/dL — AB (ref 0.7–1.3)
GAMMA GLOBULIN: 2.1 g/dL — AB (ref 0.4–1.8)
Globulin, Total: 4.4 g/dL — ABNORMAL HIGH (ref 2.2–3.9)
TOTAL PROTEIN ELP: 7.4 g/dL (ref 6.0–8.5)

## 2015-08-01 NOTE — Progress Notes (Signed)
[name]  ROUNDING NOTE   Subjective:   Mother at bedside. Some hepatic encephalopathy last night.  Melanotic stools.  Creatinine 1.45 (2.44) UOP 2750  Ultrasound: no ascites  Objective:  Vital signs in last 24 hours:  Temp:  [97.8 F (36.6 C)-98.4 F (36.9 C)] 98.1 F (36.7 C) (11/09 1337) Pulse Rate:  [64-76] 64 (11/09 1337) Resp:  [16-20] 18 (11/09 1337) BP: (140-162)/(66-80) 140/66 mmHg (11/09 1337) SpO2:  [97 %] 97 % (11/09 1337)  Weight change:  Filed Weights   07/30/15 1227  Weight: 128.368 kg (283 lb)    Intake/Output: I/O last 3 completed shifts: In: 4252.3 [P.O.:220; I.V.:4032.3] Out: 3403 [Urine:3400; Stool:3]   Intake/Output this shift:  Total I/O In: 212 [I.V.:212] Out: 0   Physical Exam: General: NAD  Head: Normocephalic, atraumatic. Moist oral mucosal membranes  Eyes: Anicteric, PERRL  Neck: Supple, trachea midline  Lungs:  Clear to auscultation  Heart: Regular rate and rhythm  Abdomen:  Soft, nontender, +distendend  Extremities:  no peripheral edema.  Neurologic: Nonfocal, moving all four extremities  Skin: No lesions  GU: Foley with clear urine    Basic Metabolic Panel:  Recent Labs Lab 07/30/15 1237 07/30/15 1934 07/31/15 0520 08/01/15 0515  NA 134* 138 138 140  K 7.0* 5.2* 5.4* 4.6  CL 102 107 108 113*  CO2 22 18* 20* 20*  GLUCOSE 93 76 101* 96  BUN 90* 81* 79* 49*  CREATININE 3.59* 2.98* 2.44* 1.45*  CALCIUM 10.4* 9.8 9.9 10.0  MG  --  1.6*  --   --   PHOS  --  6.2*  --   --     Liver Function Tests:  Recent Labs Lab 07/30/15 1237 08/01/15 0515  AST 108* 105*  ALT 95* 94*  ALKPHOS 80 76  BILITOT 2.0* 1.7*  PROT 9.4* 8.1  ALBUMIN 4.1 3.4*   No results for input(s): LIPASE, AMYLASE in the last 168 hours.  Recent Labs Lab 07/30/15 1237 07/31/15 1443 08/01/15 0515  AMMONIA 116* 186* 100*    CBC:  Recent Labs Lab 07/30/15 1237 07/31/15 1430 08/01/15 0515  WBC 10.9* 8.3 7.9  HGB 15.1  12.9* 13.4  HCT 44.9 39.7* 40.2  MCV 97.0 97.5 97.9  PLT 129* 91* 79*    Cardiac Enzymes: No results for input(s): CKTOTAL, CKMB, CKMBINDEX, TROPONINI in the last 168 hours.  BNP: Invalid input(s): POCBNP  CBG:  Recent Labs Lab 07/31/15 1127 07/31/15 1640 07/31/15 2126 08/01/15 0714 08/01/15 1135  GLUCAP 111* 99 82 96 103*    Microbiology: Results for orders placed or performed during the hospital encounter of 07/04/15  Body fluid culture     Status: None   Collection Time: 07/04/15  8:41 PM  Result Value Ref Range Status   Specimen Description ABDOMEN  Final   Special Requests NONE  Final   Gram Stain RARE WBC SEEN NO ORGANISMS SEEN   Final   Culture NO GROWTH 4 DAYS  Final   Report Status 07/09/2015 FINAL  Final    Coagulation Studies:  Recent Labs  07/30/15 1238  LABPROT 16.3*  INR 1.29    Urinalysis:  Recent Labs  07/31/15 1554  COLORURINE YELLOW*  LABSPEC 1.015  PHURINE 7.0  GLUCOSEU NEGATIVE  HGBUR 3+*  BILIRUBINUR NEGATIVE  KETONESUR NEGATIVE  PROTEINUR NEGATIVE  NITRITE NEGATIVE  LEUKOCYTESUR NEGATIVE      Imaging: [place] Abdomen Limited  08/01/2015  CLINICAL DATA:  Hepatic steatosis, ascites last month. EXAM: LIMITED ABDOMEN  ULTRASOUND FOR ASCITES TECHNIQUE: Limited ultrasound survey for ascites was performed in all four abdominal quadrants. COMPARISON:  07/05/2015 FINDINGS: Four quadrant survey of the abdomen reveals no appreciable ascites. IMPRESSION: 1. No ascites identified. Electronically Signed   By: Gaylyn Rong M.D.   On: 08/01/2015 09:29     Medications:   . sodium chloride 100 mL/hr at 08/01/15 0735   . citalopram  20 mg Oral Daily  . docusate sodium  100 mg Oral BID  . insulin aspart  0-9 Units Subcutaneous TID WC  . lactulose  20 g Oral TID  . multivitamin with minerals  1 tablet Oral Daily  . OLANZapine  15 mg Oral QHS  . pantoprazole (PROTONIX) IV  40 mg Intravenous Q12H  . rifaximin  550 mg Oral BID    albuterol, ondansetron **OR** ondansetron (ZOFRAN) IV, oxyCODONE, senna-docusate  Assessment/ Plan:  [name] is a 34 y.o. white male with schizophrenia, hyperlipidemia, depression, alcohol abuse, alcoholic liver disease, diabetes mellitus type II, alcoholic cardiomyopathy who lives with his mother, who was admitted to [facility] on 07/30/2015 for acute renal failure with hyperkalemia.   1. Acute renal failure with hyperkalemia: Baseline creatinine of 0.98 on 07/08/15. Acute renal failure from hypotension and acute blood loss. Nonoliguric urine output - Creatinine improving to 2.44 today with IV fluids and supportive measures. Decrease IV fluids to 64ml/hr - Potassium improved. Holding spironolactone and losartan.  - Continue to hold diuretics: home regimen of spironolactone, furosemide and hydrochlorothiazide. Do not recommend discharge with any diuretics as he does not have any ascites currently.  Ultrasound from this admission and 10/16 reviewed.  - Renally dose all medications.  - Continue to monitor volume status, electrolytes and urine output.   2. 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 No proteinuria on new urinalysis.   - continue glucose control Pending SPEP and UPEP  3. Acute blood loss anemia: with GI bleed. Hemoglobin healthy at 13.4 - appreciate gastroenterology. Dr. Shelle Iron   LOS: 2 [place], [facility] 11/9/20163:14 PM

## 2015-08-01 NOTE — Progress Notes (Signed)
When pt was assessed, pink tinged urine was present in catheter bag. MD made aware. Will continue to monitor pt.  Karsten RoLauren E Hobbs

## 2015-08-01 NOTE — Progress Notes (Signed)
Initial Nutrition Assessment       INTERVENTION:  Meals and snacks: cater to pt preferences, recommend adding low sodium diet restriction to current diet order Nutrition diet education: Discussed low sodium diet with pt and mother at mother's request.  Mother and pt verbalized understanding, expect fair compliance.    NUTRITION DIAGNOSIS:   Inadequate oral intake related to poor appetite as evidenced by per patient/family report.    GOAL:   Patient will meet greater than or equal to 90% of their needs    MONITOR:    (Energy intake, Electrolyte and renal profile)  REASON FOR ASSESSMENT:   Malnutrition Screening Tool    ASSESSMENT:      Pt admitted with ARF, heme positive stools  Past Medical History  Diagnosis Date  . Hypertension   . Schizophrenia (HCC)   . Hyperlipemia   . Depression   . Diabetes (HCC)   . Liver disease   . Diabetes mellitus, type II (HCC)   . Heart disease   . Cirrhosis of liver with ascites (HCC)      Current Nutrition: ate 90% of pizza today for lunch  Food/Nutrition-Related History: pt reports poor po intake for 2-3 weeks prior to admission   Scheduled Medications:  . citalopram  20 mg Oral Daily  . docusate sodium  100 mg Oral BID  . insulin aspart  0-9 Units Subcutaneous TID WC  . lactulose  20 g Oral TID  . multivitamin with minerals  1 tablet Oral Daily  . OLANZapine  15 mg Oral QHS  . pantoprazole (PROTONIX) IV  40 mg Intravenous Q12H  . rifaximin  550 mg Oral BID    Continuous Medications:  . sodium chloride 100 mL/hr at 08/01/15 0735     Electrolyte/Renal Profile and Glucose Profile:   Recent Labs Lab 07/30/15 1934 07/31/15 0520 08/01/15 0515  NA 138 138 140  K 5.2* 5.4* 4.6  CL 107 108 113*  CO2 18* 20* 20*  BUN 81* 79* 49*  CREATININE 2.98* 2.44* 1.45*  CALCIUM 9.8 9.9 10.0  MG 1.6*  --   --   PHOS 6.2*  --   --   GLUCOSE 76 101* 96   Protein Profile:  Recent Labs Lab 07/30/15 1237  08/01/15 0515  ALBUMIN 4.1 3.4*    Gastrointestinal Profile: Last BM:11/8   Nutrition-Focused Physical Exam Findings: Nutrition-Focused physical exam completed. Findings are WDL for fat depletion, muscle depletion, and edema.     Weight Change: noted 14% weight loss in the last 2 months but pt reports large amount of fluid removed with paracentesis.     Diet Order:  DIET SOFT Room service appropriate?: Yes; Fluid consistency:: Thin  Skin:   reviewed   Height:   Ht Readings from Last 1 Encounters:  07/30/15 6\' 3"  (1.905 m)    Weight:   Wt Readings from Last 1 Encounters:  07/30/15 283 lb (128.368 kg)     BMI:  Body mass index is 35.37 kg/(m^2).  Estimated Nutritional Needs:   Kcal:  BEE 1905 kcals (IF 1.0-1.3, AF 1.2) 1610-96042286-2971 kcals/d  Protein:  (1.1-1.3 g/kg) 98-116 g/d  Fluid:  (25-6230ml/kg) 2225-266670ml/d  EDUCATION NEEDS:   Education needs addressed  MODERATE Care Level  Annaleah Arata B. Freida BusmanAllen, RD, LDN [phone] (pager)

## 2015-08-01 NOTE — Progress Notes (Signed)
Dr. Anne HahnWillis notified of pt being very slow to respond.  Attempted to do sternal rub on pt x 10-15 min. VSS. BSL 100. Rapid response called. Grips moderate. Pupils slow to react to light.  MD stated that he will come by to see pt in a little bit.

## 2015-08-01 NOTE — Progress Notes (Signed)
   08/01/15 2225  Clinical Encounter Type  Visited With Patient  Visit Type Initial;Other (Comment) (rapid response)  Referral From Nurse  Consult/Referral To Chaplain  Responded to room. Pt OK, no needs at this time. Konrad PentaChap Rick Zenith Kercheval [phone]

## 2015-08-01 NOTE — Evaluation (Signed)
Physical Therapy Evaluation Patient Details Name: Howard [name] MRN: [id number] DOB: 1981-08-17 Today's Date: 08/01/2015   History of Present Illness  Pt is a 34 yo male who was admitted for acute kidney failure and feeling weak. Pt has hx of aloholic cirrhosis of the liver.   Clinical Impression  Pt presents with hx of HTN, depression, DM, heart disease, and cirrhosis of liver. Examination reveals that pt performs bed mobility and transfers at mod I, and ambulation of 100 ft at [facility]. Pt's primary physical deficits are minor balance issues as well as decreased activity tolerance. In order to remain safe in home environment it was communicated that pt needs to continue to use RW. He will continue to benefit from skilled PT in the form of HHPT to address the above deficits and return to optimal PLOF. Pt is pleasant and cooperative with therapy.     Follow Up Recommendations Home health PT;Supervision - Intermittent    Equipment Recommendations  None recommended by PT    Recommendations for Other Services       Precautions / Restrictions Precautions Precautions: Fall Restrictions Weight Bearing Restrictions: No      Mobility  Bed Mobility Overal bed mobility: Modified Independent             General bed mobility comments: good use of hand rails to assist him getting into sitting  Transfers Overall transfer level: Modified independent Equipment used: Rolling walker (2 wheeled)             General transfer comment: pt needs cues on hand placement prior to transfer. Stable throughout.  Ambulation/Gait Ambulation/Gait assistance: Min guard Ambulation Distance (Feet): 100 Feet Assistive device: Rolling walker (2 wheeled) Gait Pattern/deviations: Narrow base of support;Drifts right/left Gait velocity: decreased Gait velocity interpretation: Below normal speed for age/gender General Gait Details: Pt ambulates with increased time and slightly wobbly gait with RW, but any  sway that he exhibits is self-correctable   Stairs            Wheelchair Mobility    Modified Rankin (Stroke Patients Only)       Balance Overall balance assessment: History of Falls                                           Pertinent Vitals/Pain Pain Assessment: No/denies pain    Home Living Family/patient expects to be discharged to:: Private residence Living Arrangements: Parent Available Help at Discharge: Family;Available 24 hours/day Type of Home: House Home Access: Stairs to enter Entrance Stairs-Rails: Can reach both Entrance Stairs-Number of Steps: 4 Home Layout: One level Home Equipment: Walker - 2 wheels;Cane - single point;Bedside commode      Prior Function Level of Independence: Independent with assistive device(s)         Comments: Pt largely ambulating household distances with RW. Mom and brother assist him with ADLs.      Hand Dominance        Extremity/Trunk Assessment   Upper Extremity Assessment: Overall WFL for tasks assessed           Lower Extremity Assessment: Overall WFL for tasks assessed         Communication   Communication: No difficulties  Cognition Arousal/Alertness: Awake/alert Behavior During Therapy: WFL for tasks assessed/performed Overall Cognitive Status: History of cognitive impairments - at baseline  General Comments      Exercises Other Exercises Other Exercises: Pt was assisted with toileting where education was provided on transfer training and safety practices when using the bathroom at home. Pt able to clean himself following BM.       Assessment/Plan    PT Assessment Patient needs continued PT services  PT Diagnosis Abnormality of gait (decreased activity tolerance)   PT Problem List Decreased activity tolerance;Decreased balance  PT Treatment Interventions DME instruction;Gait training;Stair training;Functional mobility training;Therapeutic  activities;Therapeutic exercise;Balance training;Neuromuscular re-education;Cognitive remediation   PT Goals (Current goals can be found in the Care Plan section) Acute Rehab PT Goals Patient Stated Goal: to return home PT Goal Formulation: With patient Time For Goal Achievement: 08/15/15 Potential to Achieve Goals: Good    Frequency Min 2X/week   Barriers to discharge        Co-evaluation               End of Session Equipment Utilized During Treatment: Gait belt;Oxygen Activity Tolerance: Patient tolerated treatment well Patient left: in bed;with call bell/phone within reach;with bed alarm set;with family/visitor present Nurse Communication: Mobility status         Time: [id number] PT Time Calculation (min) (ACUTE ONLY): 28 min   Charges:         PT G CodesBenna Dunks:        Thy Gullikson 08/01/2015, 3:09 PM  Benna Dunksasey Irl Bodie, SPT. [phone]

## 2015-08-01 NOTE — Progress Notes (Signed)
Per Dr. Amado CoeGouru okay to place order for Physical therapy

## 2015-08-01 NOTE — Evaluation (Signed)
Physical Therapy Evaluation Patient Details Name: Howard [name] MRN: [id number] DOB: 1980/12/03 Today's Date: 08/01/2015   History of Present Illness  Pt is a 34 yo male who was admitted for acute kidney failure and feeling weak. Pt has hx of aloholic cirrhosis of the liver.   Clinical Impression  Pt presents with hx of HTN, depression, DM, heart disease, and cirrhosis of liver. Examination reveals that pt performs bed mobility and transfers at mod I, and ambulation of 100 ft at [facility]. Pt's primary physical deficits are minor balance issues as well as decreased activity tolerance. In order to remain safe in home environment it was communicated that pt needs to continue to use RW. He will continue to benefit from skilled PT in the form of HHPT to address the above deficits and return to optimal PLOF. All mobility performed on 3L O2  and pt never fell below 90% during or after mobility. Pt is pleasant and cooperative with therapy.      Follow Up Recommendations Home health PT;Supervision - Intermittent    Equipment Recommendations  None recommended by PT    Recommendations for Other Services       Precautions / Restrictions Precautions Precautions: Fall Restrictions Weight Bearing Restrictions: No      Mobility  Bed Mobility Overal bed mobility: Modified Independent             General bed mobility comments: good use of hand rails to assist him getting into sitting  Transfers Overall transfer level: Modified independent Equipment used: Rolling walker (2 wheeled)             General transfer comment: pt needs cues on hand placement prior to transfer. Stable throughout.  Ambulation/Gait Ambulation/Gait assistance: Min guard Ambulation Distance (Feet): 100 Feet Assistive device: Rolling walker (2 wheeled) Gait Pattern/deviations: Narrow base of support;Drifts right/left Gait velocity: decreased Gait velocity interpretation: Below normal speed for  age/gender General Gait Details: Pt ambulates with increased time and slightly wobbly gait with RW, but any sway that he exhibits is self-correctable   Stairs            Wheelchair Mobility    Modified Rankin (Stroke Patients Only)       Balance Overall balance assessment: History of Falls                                           Pertinent Vitals/Pain Pain Assessment: No/denies pain    Home Living Family/patient expects to be discharged to:: Private residence Living Arrangements: Parent Available Help at Discharge: Family;Available 24 hours/day Type of Home: House Home Access: Stairs to enter Entrance Stairs-Rails: Can reach both Entrance Stairs-Number of Steps: 4 Home Layout: One level Home Equipment: Walker - 2 wheels;Cane - single point;Bedside commode      Prior Function Level of Independence: Independent with assistive device(s)         Comments: Pt largely ambulating household distances with RW. Mom and brother assist him with ADLs.      Hand Dominance        Extremity/Trunk Assessment   Upper Extremity Assessment: Overall WFL for tasks assessed           Lower Extremity Assessment: Overall WFL for tasks assessed         Communication   Communication: No difficulties  Cognition Arousal/Alertness: Awake/alert Behavior During Therapy: WFL for tasks assessed/performed Overall Cognitive  Status: History of cognitive impairments - at baseline                      General Comments      Exercises Other Exercises Other Exercises: Pt was assisted with toileting where education was provided on transfer training and safety practices when using the bathroom at home. Pt able to clean himself following BM.       Assessment/Plan    PT Assessment Patient needs continued PT services  PT Diagnosis Abnormality of gait (decreased activity tolerance)   PT Problem List Decreased activity tolerance;Decreased balance  PT  Treatment Interventions DME instruction;Gait training;Stair training;Functional mobility training;Therapeutic activities;Therapeutic exercise;Balance training;Neuromuscular re-education;Cognitive remediation   PT Goals (Current goals can be found in the Care Plan section) Acute Rehab PT Goals Patient Stated Goal: to return home PT Goal Formulation: With patient Time For Goal Achievement: 08/15/15 Potential to Achieve Goals: Good    Frequency Min 2X/week   Barriers to discharge        Co-evaluation               End of Session Equipment Utilized During Treatment: Gait belt;Oxygen Activity Tolerance: Patient tolerated treatment well Patient left: in bed;with call bell/phone within reach;with bed alarm set;with family/visitor present Nurse Communication: Mobility status         Time: [id number] PT Time Calculation (min) (ACUTE ONLY): 28 min   Charges:   PT Evaluation $Initial PT Evaluation Tier I: 1 Procedure PT Treatments $Therapeutic Activity: 8-22 mins   PT G CodesBenna Dunks:        Howard [name] 08/01/2015, 4:54 PM  Benna Dunksasey Aydon Swamy, SPT. [phone]

## 2015-08-01 NOTE — Care Management (Addendum)
Pt recommends continued Home Health. Patient already open to  [facility] providers.  [facility] kim [facility]ray [facility] to inform left voicemail.

## 2015-08-01 NOTE — Progress Notes (Addendum)
[facility] [place] at [facility]   PATIENT NAME: Howard [name]    MR#:  [id number]  DATE OF BIRTH:  06-19-1981  SUBJECTIVE:  CHIEF COMPLAINT:   Chief Complaint  Patient presents with  . GI Bleeding   -Patient was recently admitted to the hospital last month for new onset alcoholic liver cirrhosis and also ascites and had paracentesis done at that time. Seen GI as outpatient  - presented to the hospital for weakness and noted to have acute renal failure and hyperkalemia. - Today patient is feelingbetter, urine output is better. Renal function improving.  REVIEW OF SYSTEMS:  Review of Systems  Constitutional: Positive for malaise/fatigue. Negative for fever and chills.  HENT: Negative for ear discharge, ear pain and nosebleeds.   Eyes: Negative for blurred vision and double vision.  Respiratory: Negative for cough, shortness of breath and wheezing.   Cardiovascular: Negative for chest pain and palpitations.  Gastrointestinal: Negative for nausea, vomiting, abdominal pain, diarrhea and constipation.  Genitourinary: Negative for dysuria.  Neurological: Positive for tremors and weakness. Negative for dizziness, sensory change, speech change, focal weakness, seizures and headaches.  Psychiatric/Behavioral: Negative for depression.    DRUG ALLERGIES:  No Known Allergies  VITALS:  Blood pressure 156/78, pulse 92, temperature 98.7 F (37.1 C), temperature source Oral, resp. rate 16, height  (1.905 m), weight 128.368 kg (283 lb), SpO2 97 %.  PHYSICAL EXAMINATION:  Physical Exam  GENERAL:  34 y.o.-year-old obese patient lying in the bed with no acute distress.  EYES: Pupils equal, round, reactive to light and accommodation. No scleral icterus. Extraocular muscles intact.  HEENT: Head atraumatic, normocephalic. Oropharynx and nasopharynx clear.  NECK:  Supple, no jugular venous distention. No thyroid enlargement, no tenderness.  LUNGS: Normal breath  sounds bilaterally, no wheezing, rales,rhonchi or crepitation. No use of accessory muscles of respiration.  CARDIOVASCULAR: S1, S2 normal. No murmurs, rubs, or gallops.  ABDOMEN: Soft, nontender, nondistended. Bowel sounds present. No organomegaly or mass.  EXTREMITIES: No pedal edema, cyanosis, or clubbing.  NEUROLOGIC: Cranial nerves II through XII are intact. Muscle strength 4/5 in all extremities with generalized weakness. Sensation intact. Gait not checked. Significant flapping tremors present in exam PSYCHIATRIC: The patient is alert and oriented x 3. Appears very weak SKIN: No obvious rash, lesion, or ulcer.    LABORATORY PANEL:   CBC  Recent Labs Lab 08/01/15 0515  WBC 7.9  HGB 13.4  HCT 40.2  PLT 79*   ------------------------------------------------------------------------------------------------------------------  Chemistries   Recent Labs Lab 07/30/15 1934  08/01/15 0515  NA 138  < > 140  K 5.2*  < > 4.6  CL 107  < > 113*  CO2 18*  < > 20*  GLUCOSE 76  < > 96  BUN 81*  < > 49*  CREATININE 2.98*  < > 1.45*  CALCIUM 9.8  < > 10.0  MG 1.6*  --   --   AST  --   --  105*  ALT  --   --  94*  ALKPHOS  --   --  76  BILITOT  --   --  1.7*  < > = values in this interval not displayed. ------------------------------------------------------------------------------------------------------------------  Cardiac Enzymes No results for input(s): TROPONINI in the last 168 hours. ------------------------------------------------------------------------------------------------------------------  RADIOLOGY:  US Abdomen Limited  08/01/2015  CLINICAL DATA:  Hepatic steatosis, ascites last month. EXAM: LIMITED ABDOMEN ULTRASOUND FOR ASCITES TECHNIQUE: Limited ultrasound survey for [facility] performed in all four abdominal  quadrants. COMPARISON:  07/05/2015 FINDINGS: Four quadrant survey of the abdomen reveals no appreciable ascites. IMPRESSION: 1. No ascites identified.  Electronically Signed   By: Gaylyn RongWalter  Liebkemann M.D.   On: 08/01/2015 09:29    EKG:   Orders placed or performed during the hospital encounter of 07/30/15  . ED EKG  . ED EKG    ASSESSMENT AND PLAN:   34 year old male with past medical history significant for hypertension, depression, schizophrenia and alcohol abuse and recent admission in October 2016 for alcoholic liver disease with ascites and was discharged presents to the hospital secondary to weakness and coffee-ground emesis.  #1 acute renal failure with hyperkalemia-likely prerenal and ATN -Potassium was corrected and improved to 4.6 today. -Hold his Aldactone, losartan and also Lasix. -Reduced IV fluids, urine output is improved and renal function improved from 3.5-2.4.--> 1.45  Baseline is at 0.9 from last month. -Appreciate nephrology consult. Hold other nephrotoxins  #2 hematemesis and melena-hemoglobin is stable. -Seen by Dr. Shelle Ironein in the clinic 2 weeks ago and was scheduled to have EGD done yesterday. -Repeat hemoglobin 13.4 . -GI recommended u/s - with no ascites -Continue Protonix  #3 liver cirrhosis-alcoholic liver cirrhosis. Diagnosed last month. -Ascites was drained, greater than 4.5 L last month. -Hold Lasix and Aldactone at this time. -Worsening tremors noted on exam-ammonia is ordered. -on lactulose and rifaximin and also follow-up ammonia tomorrow a.m.  #4 schizophrenia-seen by psychiatric last admission. -Continue Zyprexa -Also continue Celexa  #5 DVT prophylaxis-Ted's and SCDs. -We will hold heparin products due to his GI bleed and thrombocytopenia  Disposition home with home health per PT recommendations with intermittent supervision  All the records are reviewed and case discussed with Care Management/Social Workerr. Management plans discussed with the patient, family and they are in agreement.  CODE STATUS: Full code  TOTAL TIME TAKING CARE OF THIS PATIENT: 35 minutes.   POSSIBLE D/C IN 2-3  DAYS, DEPENDING ON CLINICAL CONDITION.   Ramonita LabGouru, Carron Mcmurry M.D on 08/01/2015 at 9:15 PM  Between 7am to 6pm - Pager - [phone]  After 6pm go to [email or web address] - password [email or web address] [facility]  [facility]  Office  [phone]  CC: Primary care physician; Imelda PillowHOLLAND, CHELSA, NP

## 2015-08-01 NOTE — Progress Notes (Signed)
GI Note:  Creat and K+ improving but he is now encephalopathic with +asterixis.  Agree with starting lactulose and xifaxin, need to titrate to 2 - 3 stools per day. Would also obtain abd u/s and if ascites present, perform diagnostic only paracentesis to rule out SBP.

## 2015-08-01 NOTE — Progress Notes (Signed)
GI Inpatient Follow-up Note  Patient Identification: Howard [name] is a 34 y.o. male with ETOH cirhosis c/b ascites  Subjective:  Better today.  Reports one stool so far today.  Still says stool is dark but Hgb normal and stable.  Denies abd pain, n/v, f/c.   Scheduled Inpatient Medications:  . citalopram  20 mg Oral Daily  . docusate sodium  100 mg Oral BID  . insulin aspart  0-9 Units Subcutaneous TID WC  . lactulose  20 g Oral TID  . multivitamin with minerals  1 tablet Oral Daily  . OLANZapine  15 mg Oral QHS  . pantoprazole (PROTONIX) IV  40 mg Intravenous Q12H  . rifaximin  550 mg Oral BID    Continuous Inpatient Infusions:   . sodium chloride 100 mL/hr at 08/01/15 1812    PRN Inpatient Medications:  albuterol, ondansetron **OR** ondansetron (ZOFRAN) IV, oxyCODONE, senna-docusate    Physical Examination: BP 156/78 mmHg  Pulse 92  Temp(Src) 98.7 F (37.1 C) (Oral)  Resp 16  Ht 6\' 3"  (1.905 m)  Wt 128.368 kg (283 lb)  BMI 35.37 kg/m2  SpO2 97% Gen: NAD, alert and oriented x 3, slightly somnolent  Neck: supple, no JVD or thyromegaly Chest: CTA bilaterally, no wheezes, crackles, or other adventitious sounds CV: RRR, no m/g/c/r Abd: soft, NT, ND, +BS in all four quadrants; no HSM, guarding, ridigity, or rebound tenderness, + obese abdomen Ext: no edema, well perfused with 2+ pulses, Skin: no rash or lesions noted Lymph: no LAD  Data: Lab Results  Component Value Date   WBC 7.9 08/01/2015   HGB 13.4 08/01/2015   HCT 40.2 08/01/2015   MCV 97.9 08/01/2015   PLT 79* 08/01/2015    Recent Labs Lab 07/30/15 1237 07/31/15 1430 08/01/15 0515  HGB 15.1 12.9* 13.4   Lab Results  Component Value Date   NA 140 08/01/2015   K 4.6 08/01/2015   CL 113* 08/01/2015   CO2 20* 08/01/2015   BUN 49* 08/01/2015   CREATININE 1.45* 08/01/2015   Lab Results  Component Value Date   ALT 94* 08/01/2015   AST 105* 08/01/2015   ALKPHOS 76 08/01/2015   BILITOT 1.7*  08/01/2015    Recent Labs Lab 07/30/15 1238  APTT 36  INR 1.29   Assessment/Plan: [name] is a 34 y.o. male with ETOH cirrhosis c/b ascites a/w ARF secondary to over-diuresis.  Doing much better clinically now. Creat nearly normalized.  Mentals status and asterixis improved.  No ascites on u/s.  - continue lactulose 20 q 8 and xifaxin - will need EGD if Hgb drops any further, if Hgb remains stable, could be done as outpatient.  - would d//c on NO diuretics, just low Na diet since no ascites on u/s.    Please call with questions or concerns.  Tanice Petre, Addison NaegeliMATTHEW GORDON, MD

## 2015-08-02 LAB — GLUCOSE, CAPILLARY
GLUCOSE-CAPILLARY: 84 mg/dL (ref 65–99)
GLUCOSE-CAPILLARY: 133 mg/dL — AB (ref 65–99)
GLUCOSE-CAPILLARY: 100 mg/dL — AB (ref 65–99)
Glucose-Capillary: 96 mg/dL (ref 65–99)

## 2015-08-02 LAB — BASIC METABOLIC PANEL
Anion gap: 8 (ref 5–15)
BUN: 30 mg/dL — AB (ref 6–20)
CALCIUM: 9.8 mg/dL (ref 8.9–10.3)
CO2: 20 mmol/L — ABNORMAL LOW (ref 22–32)
Chloride: 107 mmol/L (ref 101–111)
Creatinine, Ser: 1.06 mg/dL (ref 0.61–1.24)
GFR calc Af Amer: >60 mL/min (ref >60)
GLUCOSE: 105 mg/dL — AB (ref 65–99)
Potassium: 4.1 mmol/L (ref 3.5–5.1)
Sodium: 135 mmol/L (ref 135–145)

## 2015-08-02 LAB — CBC
HCT: 39.6 % — ABNORMAL LOW (ref 40.0–52.0)
Hemoglobin: 13 g/dL (ref 13.0–18.0)
MCH: 32.2 pg (ref 26.0–34.0)
MCHC: 33 g/dL (ref 32.0–36.0)
MCV: 97.7 fL (ref 80.0–100.0)
PLATELETS: 68 10*3/uL — AB (ref 150–440)
RBC: 4.05 MIL/uL — ABNORMAL LOW (ref 4.40–5.90)
RDW: 16 % — AB (ref 11.5–14.5)
WBC: 8.3 10*3/uL (ref 3.8–10.6)

## 2015-08-02 LAB — AMMONIA
AMMONIA: 72 umol/L — AB (ref 9–35)
Ammonia: 61 umol/L — ABNORMAL HIGH (ref 9–35)

## 2015-08-02 MED ORDER — LACTULOSE 10 GM/15ML PO SOLN
30.0000 g | Freq: Four times a day (QID) | ORAL | Status: DC
  Administered 2015-08-02 (×2): 30 g via ORAL
  Administered 2015-08-03: 20 g via ORAL
  Administered 2015-08-04: 30 g via ORAL
  Administered 2015-08-04: 20 g via ORAL
  Filled 2015-08-02 (×7): qty 60

## 2015-08-02 MED ORDER — LACTULOSE 10 GM/15ML PO SOLN: 30.0000 g | Freq: Three times a day (TID) | ORAL | Status: DC

## 2015-08-02 NOTE — Care Management (Signed)
Received call from mother of patient. Patient was open to [facility] prior to admission. Would like to continue services at discharged.

## 2015-08-02 NOTE — Progress Notes (Signed)
[name]  ROUNDING NOTE   Subjective:   Mother at bedside.  Creatinine 1.06 (1.45) UOP 2225  IV NS at 100  Objective:  Vital signs in last 24 hours:  Temp:  [98.1 F (36.7 C)-98.7 F (37.1 C)] 98.4 F (36.9 C) (11/10 0605) Pulse Rate:  [64-93] 83 (11/10 0605) Resp:  [16-20] 20 (11/10 0605) BP: (139-156)/(66-86) 139/75 mmHg (11/10 0605) SpO2:  [94 %-98 %] 94 % (11/10 0605)  Weight change:  Filed Weights   07/30/15 1227  Weight: 128.368 kg (283 lb)    Intake/Output: I/O last 3 completed shifts: In: 3343.1 [P.O.:120; I.V.:3223.1] Out: 3677 [Urine:3675; Stool:2]   Intake/Output this shift:  Total I/O In: 490 [P.O.:240; I.V.:250] Out: 510 [Urine:510]  Physical Exam: General: NAD  Head: Normocephalic, atraumatic. Moist oral mucosal membranes  Eyes: Anicteric, PERRL  Neck: Supple, trachea midline  Lungs:  Clear to auscultation  Heart: Regular rate and rhythm  Abdomen:  Soft, nontender, +distendend  Extremities:  no peripheral edema.  Neurologic: Nonfocal, moving all four extremities  Skin: No lesions  GU: Foley with clear urine    Basic Metabolic Panel:  Recent Labs Lab 07/30/15 1237 07/30/15 1934 07/31/15 0520 08/01/15 0515 08/02/15 0550  NA 134* 138 138 140 135  K 7.0* 5.2* 5.4* 4.6 4.1  CL 102 107 108 113* 107  CO2 22 18* 20* 20* 20*  GLUCOSE 93 76 101* 96 105*  BUN 90* 81* 79* 49* 30*  CREATININE 3.59* 2.98* 2.44* 1.45* 1.06  CALCIUM 10.4* 9.8 9.9 10.0 9.8  MG  --  1.6*  --   --   --   PHOS  --  6.2*  --   --   --     Liver Function Tests:  Recent Labs Lab 07/30/15 1237 08/01/15 0515  AST 108* 105*  ALT 95* 94*  ALKPHOS 80 76  BILITOT 2.0* 1.7*  PROT 9.4* 8.1  ALBUMIN 4.1 3.4*   No results for input(s): LIPASE, AMYLASE in the last 168 hours.  Recent Labs Lab 07/31/15 1443 08/01/15 0515 08/02/15 0550  AMMONIA 186* 100* 72*    CBC:  Recent Labs Lab 07/30/15 1237 07/31/15 1430 08/01/15 0515 08/02/15 0550   WBC 10.9* 8.3 7.9 8.3  HGB 15.1 12.9* 13.4 13.0  HCT 44.9 39.7* 40.2 39.6*  MCV 97.0 97.5 97.9 97.7  PLT 129* 91* 79* 68*    Cardiac Enzymes: No results for input(s): CKTOTAL, CKMB, CKMBINDEX, TROPONINI in the last 168 hours.  BNP: Invalid input(s): POCBNP  CBG:  Recent Labs Lab 08/01/15 1135 08/01/15 1642 08/01/15 2045 08/01/15 2227 08/02/15 0743  GLUCAP 103* 107* 97 100* 96    Microbiology: Results for orders placed or performed during the hospital encounter of 07/04/15  Body fluid culture     Status: None   Collection Time: 07/04/15  8:41 PM  Result Value Ref Range Status   Specimen Description ABDOMEN  Final   Special Requests NONE  Final   Gram Stain RARE WBC SEEN NO ORGANISMS SEEN   Final   Culture NO GROWTH 4 DAYS  Final   Report Status 07/09/2015 FINAL  Final    Coagulation Studies:  Recent Labs  07/30/15 1238  LABPROT 16.3*  INR 1.29    Urinalysis:  Recent Labs  07/31/15 1554  COLORURINE YELLOW*  LABSPEC 1.015  PHURINE 7.0  GLUCOSEU NEGATIVE  HGBUR 3+*  BILIRUBINUR NEGATIVE  KETONESUR NEGATIVE  PROTEINUR NEGATIVE  NITRITE NEGATIVE  LEUKOCYTESUR NEGATIVE  Imaging: US Abdomen Limited  08/01/2015  CLINICAL DATA:  Hepatic steatosis, ascites last month. EXAM: LIMITED ABDOMEN ULTRASOUND FOR ASCITES TECHNIQUE: Limited ultrasound survey for ascites was performed in all four abdominal quadrants. COMPARISON:  07/05/2015 FINDINGS: Four quadrant survey of the abdomen reveals no appreciable ascites. IMPRESSION: 1. No ascites identified. Electronically Signed   By: Gaylyn Rong M.D.   On: 08/01/2015 09:29     Medications:   . sodium chloride 1 mL (08/02/15 0353)   . citalopram  20 mg Oral Daily  . docusate sodium  100 mg Oral BID  . insulin aspart  0-9 Units Subcutaneous TID WC  . lactulose  20 g Oral TID  . multivitamin with minerals  1 tablet Oral Daily  . OLANZapine  15 mg Oral QHS  . pantoprazole (PROTONIX) IV  40 mg  Intravenous Q12H  . rifaximin  550 mg Oral BID   albuterol, ondansetron **OR** ondansetron (ZOFRAN) IV, oxyCODONE, senna-docusate  Assessment/ Plan:  Howard [name] is a 34 y.o. white male with schizophrenia, hyperlipidemia, depression, alcohol abuse, alcoholic liver disease, diabetes mellitus type II, alcoholic cardiomyopathy who lives with his mother, who was admitted to [facility] on 07/30/2015 for acute renal failure with hyperkalemia.   1. Acute renal failure with hyperkalemia: Baseline creatinine of 0.98 on 07/08/15. Acute renal failure from hypotension and acute blood loss. Nonoliguric urine output - Creatinine improving - Stop IV fluids today. Encourage PO intake.  - Potassium improved. Holding spironolactone and losartan.  - Continue to hold diuretics: home regimen of spironolactone, furosemide and hydrochlorothiazide. Do not recommend discharge with any diuretics as he does not have any ascites currently.  Ultrasound from this admission and 10/16 reviewed.  - Renally dose all medications.  - Continue to monitor volume status, electrolytes and urine output.   2. 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 No proteinuria on new urinalysis.   - continue glucose control Pending SPEP and UPEP  3. Acute blood loss anemia: with GI bleed. Hemoglobin healthy  - appreciate gastroenterology. Dr. Shelle Iron  Will sign off at this time, Please call with questions.    LOS: 3 Howard [name] 11/10/201610:38 AM

## 2015-08-02 NOTE — Progress Notes (Signed)
Pt did not have a bowel movement all shift. Will relay the message to ongoing nurse. MD needs to be notified if pt does not have a bowel movement in 24 hours related to the lactulose doses.   Karsten RoLauren E Hobbs

## 2015-08-02 NOTE — Progress Notes (Signed)
[facility] [place] [facility]   PATIENT NAME: Howard BowlerJoey [name]    MR#:  [id number]  DATE OF BIRTH:  [birth year]  SUBJECTIVE:  CHIEF COMPLAINT:   Chief Complaint  Patient presents with  . GI Bleeding   -Patient was recently admitted to the hospital last month for new onset alcoholic liver cirrhosis and also ascites and had paracentesis done at that time. Seen GI as outpatient  - presented to the hospital for weakness and noted to have acute renal failure and hyperkalemia. - Today patient is feelingbetter, but look more jaundiced .denies any bowel movements for the past 2 days. urine output is better.   REVIEW OF SYSTEMS:  Review of Systems  Constitutional: Positive for malaise/fatigue. Negative for fever and chills.  HENT: Negative for ear discharge, ear pain and nosebleeds.   Eyes: Negative for blurred vision and double vision.  Respiratory: Negative for cough, shortness of breath and wheezing.   Cardiovascular: Negative for chest pain and palpitations.  Gastrointestinal: Negative for nausea, vomiting, abdominal pain, diarrhea and constipation.  Genitourinary: Negative for dysuria.  Neurological: Positive for tremors and weakness. Negative for dizziness, sensory change, speech change, focal weakness, seizures and headaches.  Psychiatric/Behavioral: Negative for depression.    DRUG ALLERGIES:  No Known Allergies  VITALS:  Blood pressure 138/76, pulse 95, temperature 98.4 F (36.9 C), temperature source Oral, resp. rate 18, height 6\' 3"  (1.905 m), weight 128.368 kg (283 lb), SpO2 95 %.  PHYSICAL EXAMINATION:  Physical Exam  GENERAL:  34 y.o.-year-old obese patient lying in the bed with no acute distress.  EYES: Pupils equal, round, reactive to light and accommodation. No scleral icterus. Extraocular muscles intact.  HEENT: Head atraumatic, normocephalic. Oropharynx and nasopharynx clear.  NECK:  Supple, no jugular venous distention. No thyroid  enlargement, no tenderness.  LUNGS: Normal breath sounds bilaterally, no wheezing, rales,rhonchi or crepitation. No use of accessory muscles of respiration.  CARDIOVASCULAR: S1, S2 normal. No murmurs, rubs, or gallops.  ABDOMEN: Soft, nontender, nondistended. Bowel sounds present. No organomegaly or mass.  EXTREMITIES: No pedal edema, cyanosis, or clubbing.  NEUROLOGIC: Cranial nerves II through XII are intact. Muscle strength 4/5 in all extremities with generalized weakness. Sensation intact. Gait not checked. Significant flapping tremors present in exam PSYCHIATRIC: The patient is alert and oriented x 3. Appears very weak SKIN: No obvious rash, lesion, or ulcer.    LABORATORY PANEL:   CBC  Recent Labs Lab 08/02/15 0550  WBC 8.3  HGB 13.0  HCT 39.6*  PLT 68*   ------------------------------------------------------------------------------------------------------------------  Chemistries   Recent Labs Lab 07/30/15 1934  08/01/15 0515 08/02/15 0550  NA 138  < > 140 135  K 5.2*  < > 4.6 4.1  CL 107  < > 113* 107  CO2 18*  < > 20* 20*  GLUCOSE 76  < > 96 105*  BUN 81*  < > 49* 30*  CREATININE 2.98*  < > 1.45* 1.06  CALCIUM 9.8  < > 10.0 9.8  MG 1.6*  --   --   --   AST  --   --  105*  --   ALT  --   --  94*  --   ALKPHOS  --   --  76  --   BILITOT  --   --  1.7*  --   < > = values in this interval not displayed. ------------------------------------------------------------------------------------------------------------------  Cardiac Enzymes No results for input(s): TROPONINI in the last 168 hours. ------------------------------------------------------------------------------------------------------------------  RADIOLOGY:  US Abdomen Limited  08/01/2015  CLINICAL DATA:  Hepatic steatosis, ascites last month. EXAM: LIMITED ABDOMEN ULTRASOUND FOR ASCITES TECHNIQUE: Limited ultrasound survey for ascites was performed in all four abdominal quadrants. COMPARISON:   07/05/2015 FINDINGS: Four quadrant survey of the abdomen reveals no appreciable ascites. IMPRESSION: 1. No ascites identified. Electronically Signed   By: Gaylyn Rong M.D.   On: 08/01/2015 09:29    EKG:   Orders placed or performed during the hospital encounter of 07/30/15  . ED EKG  . ED EKG    ASSESSMENT AND PLAN:   34 year old male with past medical history significant for hypertension, depression, schizophrenia and alcohol abuse and recent admission in October 2016 for alcoholic liver disease with ascites and was discharged presents to the hospital secondary to weakness and coffee-ground emesis.  #1 acute renal failure with hyperkalemia-likely prerenal and ATN -Potassium was corrected and improved to 4.1 today. -Creatinine is back to normal -Nephrology is recommending continue to hold his Aldactone, losartan and also Lasix. Not recommending to discharge with any diuretics as the patient does not have any ascites per ultrasound -Discontinued IV fluids, urine output is improved and renal function improved - Appreciate nephrology consult. Hold other nephrotoxins-signed off -Pending SPEP and UPEP  #2 hematemesis and melena-hemoglobin is stable. -Seen by Dr. Shelle Iron  and is scheduled to get EGD in a.m. -Repeat hemoglobin 13.0 . -u/s - with no ascites -Continue Protonix  #3 liver cirrhosis-alcoholic liver cirrhosis. Diagnosed last month. -Ascites was drained, greater than 4.5 L last month. -Hold Lasix and Aldactone at this time. -Ammonia is 72-increase lactulose to 30 g by mouth every 6 hours -on rifaximin and also follow-up ammonia tomorrow a.m.  #4 schizophrenia-seen by psychiatric last admission. -Continue Zyprexa -Also continue Celexa  #5 DVT prophylaxis-Ted's and SCDs. -We will hold heparin products due to his GI bleed and thrombocytopenia    All the records are reviewed and case discussed with Care Management/Social Workerr. Management plans discussed with the  patient, family and they are in agreement.  CODE STATUS: Full code  TOTAL TIME TAKING CARE OF THIS PATIENT: 35 minutes.   POSSIBLE D/C IN am after EGD , DEPENDING ON CLINICAL CONDITION.   Ramonita Lab M.D on 08/02/2015 at 4:03 PM  Between 7am to 6pm - Pager - [phone]  After 6pm go to [email or web address] - password [email or web address] [facility]  [place]  Office  [phone]  CC: Primary care physician; Imelda Pillow, NP

## 2015-08-02 NOTE — Progress Notes (Signed)
GI Inpatient Follow-up Note  Patient Identification: Howard [name] is a 34 y.o. male with ETOH cirrhosis c/b ascites.   Subjective:  No stool in 2 days. Denies abd pain n/v, f/c.   No ascites on u/s.  Hgb stable.  Tolerating PO well.   Scheduled Inpatient Medications:  . citalopram  20 mg Oral Daily  . docusate sodium  100 mg Oral BID  . insulin aspart  0-9 Units Subcutaneous TID WC  . lactulose  30 g Oral Q6H  . multivitamin with minerals  1 tablet Oral Daily  . OLANZapine  15 mg Oral QHS  . pantoprazole (PROTONIX) IV  40 mg Intravenous Q12H  . rifaximin  550 mg Oral BID    Continuous Inpatient Infusions:   . sodium chloride 1 mL (08/02/15 0353)    PRN Inpatient Medications:  albuterol, ondansetron **OR** ondansetron (ZOFRAN) IV, oxyCODONE, senna-docusate    Physical Examination: BP 138/76 mmHg  Pulse 95  Temp(Src) 98.4 F (36.9 C) (Oral)  Resp 18  Ht 6' 3" (1.905 m)  Wt 128.368 kg (283 lb)  BMI 35.37 kg/m2  SpO2 95% Gen: NAD, alert and oriented x 3 HEENT: PEERLA, EOMI, Neck: supple, no JVD or thyromegaly Chest: CTA bilaterally, no wheezes, crackles, or other adventitious sounds CV: RRR, no m/g/c/r Abd: soft, NT, ND, +BS in all four quadrants; no HSM, guarding, ridigity, or rebound tenderness, + obese abdomen.  Ext: no edema, well perfused with 2+ pulses, Skin: no rash or lesions noted Lymph: no LAD  Data: Lab Results  Component Value Date   WBC 8.3 08/02/2015   HGB 13.0 08/02/2015   HCT 39.6* 08/02/2015   MCV 97.7 08/02/2015   PLT 68* 08/02/2015    Recent Labs Lab 07/31/15 1430 08/01/15 0515 08/02/15 0550  HGB 12.9* 13.4 13.0   Lab Results  Component Value Date   NA 135 08/02/2015   K 4.1 08/02/2015   CL 107 08/02/2015   CO2 20* 08/02/2015   BUN 30* 08/02/2015   CREATININE 1.06 08/02/2015   Lab Results  Component Value Date   ALT 94* 08/01/2015   AST 105* 08/01/2015   ALKPHOS 76 08/01/2015   BILITOT 1.7* 08/01/2015    Recent  Labs Lab 07/30/15 1238  APTT 36  INR 1.29   Assessment/Plan: Howard [name] is a 34 y.o. male with ETOH cirrhosis a/w AKI due to diuretics.  Doing better today but not stool in 48 hours.   Recommendations: - increase lactulose to 30 g q8 - EGD tomorrow for stable melena  - npo mn - low Na diet - will d/c home with NO diuretics given ascites is gone.   Please call with questions or concerns.  Reyes Fifield GORDON, MD  

## 2015-08-02 NOTE — Care Management (Signed)
Spoke with patient and mother. Informed them that I had contacted [facility] and they had no record of providing services for the pt.  Mother stated information was on their fridge at home and she would call me with this information this afternoon or tomorrow morning.

## 2015-08-03 ENCOUNTER — Inpatient Hospital Stay: Payer: Medicare Other | Admitting: Certified Registered Nurse Anesthetist

## 2015-08-03 ENCOUNTER — Inpatient Hospital Stay: Payer: Medicare Other

## 2015-08-03 ENCOUNTER — Encounter
Admission: EM | Disposition: A | Discharge status: Home Health | Payer: Self-pay | Source: Home / Self Care | Attending: Internal Medicine

## 2015-08-03 ENCOUNTER — Encounter: Payer: Self-pay | Admitting: Anesthesiology

## 2015-08-03 HISTORY — PX: ESOPHAGOGASTRODUODENOSCOPY (EGD) WITH PROPOFOL: SHX5813

## 2015-08-03 LAB — CBC
HEMATOCRIT: 39.2 % — AB (ref 40.0–52.0)
Hemoglobin: 13.2 g/dL (ref 13.0–18.0)
MCH: 33.1 pg (ref 26.0–34.0)
MCHC: 33.6 g/dL (ref 32.0–36.0)
MCV: 98.5 fL (ref 80.0–100.0)
PLATELETS: 57 10*3/uL — AB (ref 150–440)
RBC: 3.98 MIL/uL — ABNORMAL LOW (ref 4.40–5.90)
RDW: 16.5 % — AB (ref 11.5–14.5)
WBC: 7.8 10*3/uL (ref 3.8–10.6)

## 2015-08-03 LAB — GLUCOSE, CAPILLARY
Glucose-Capillary: 83 mg/dL (ref 65–99)
Glucose-Capillary: 83 mg/dL (ref 65–99)
Glucose-Capillary: 79 mg/dL (ref 65–99)
Glucose-Capillary: 95 mg/dL (ref 65–99)

## 2015-08-03 LAB — AMMONIA: AMMONIA: 77 umol/L — AB (ref 9–35)

## 2015-08-03 SURGERY — ESOPHAGOGASTRODUODENOSCOPY (EGD) WITH PROPOFOL
Anesthesia: General

## 2015-08-03 MED ORDER — SODIUM CHLORIDE 0.9 % IV SOLN
INTRAVENOUS | Status: DC
  Administered 2015-08-03: 1000 mL via INTRAVENOUS
  Administered 2015-08-03: 500 mL via INTRAVENOUS

## 2015-08-03 MED ORDER — FENTANYL CITRATE (PF) 100 MCG/2ML IJ SOLN
INTRAMUSCULAR | Status: DC | PRN
  Administered 2015-08-03: 50 ug via INTRAVENOUS

## 2015-08-03 MED ORDER — NADOLOL 40 MG PO TABS
40.0000 mg | ORAL_TABLET | Freq: Every day | ORAL | Status: DC
  Administered 2015-08-03 – 2015-08-04 (×2): 40 mg via ORAL
  Filled 2015-08-03 (×2): qty 1

## 2015-08-03 MED ORDER — PROPOFOL 10 MG/ML IV BOLUS
INTRAVENOUS | Status: DC | PRN
  Administered 2015-08-03: 40 mg via INTRAVENOUS

## 2015-08-03 MED ORDER — LIDOCAINE HCL (CARDIAC) 20 MG/ML IV SOLN
INTRAVENOUS | Status: DC | PRN
  Administered 2015-08-03: 100 mg via INTRAVENOUS

## 2015-08-03 MED ORDER — LACTULOSE ENEMA
300.0000 mL | Freq: Once | ORAL | Status: DC
  Filled 2015-08-03 (×2): qty 300

## 2015-08-03 MED ORDER — PROPOFOL 500 MG/50ML IV EMUL
INTRAVENOUS | Status: DC | PRN
  Administered 2015-08-03: 120 ug/kg/min via INTRAVENOUS

## 2015-08-03 NOTE — Care Management Important Message (Signed)
Important Message  Patient Details  Name: Howard [name] MRN: [id number] Date of Birth: 1981-04-14   Medicare Important Message Given:  Yes    Adonis HugueninBerkhead, Mada Sadik L, RN 08/03/2015, 1:12 PM

## 2015-08-03 NOTE — Progress Notes (Signed)
Paged [facility] for clarification of lactulose enema order; received call back from Dr Cherlynn KaiserSainani; informed Dr that I was told by supply chain that they did not carry rectal balloon catheter needed to administer; Dr acknowledged

## 2015-08-03 NOTE — Progress Notes (Signed)
Physical Therapy Treatment Patient Details Name: Howard [name] MRN: [id number] DOB: 1980-09-30 Today's Date: 08/03/2015    History of Present Illness Pt is a 34 yo male who was admitted for acute kidney failure and feeling weak. Pt has hx of aloholic cirrhosis of the liver.     PT Comments    Pt stated that he is feeling much better today and is able to ambulate considerably greater distance before fatiguing. His ambulation quality has also improved as he now has much less lateral sway. Pt is very pleasant and typically excited to ambulate with physical therapy. Due to his activity tolerance and minor gait deficits, he will continue to benefit from skilled PT in order for him to return to optimal PLOF.   Follow Up Recommendations  Home health PT;Supervision - Intermittent     Equipment Recommendations  None recommended by PT    Recommendations for Other Services       Precautions / Restrictions Precautions Precautions: Fall Restrictions Weight Bearing Restrictions: No    Mobility  Bed Mobility Overal bed mobility: Modified Independent             General bed mobility comments: good use of hand rails to assist him getting into sitting  Transfers Overall transfer level: Modified independent Equipment used: Rolling walker (2 wheeled)             General transfer comment: pt needs cues on hand placement prior to transfer. Stable throughout.  Ambulation/Gait Ambulation/Gait assistance: Min guard Ambulation Distance (Feet): 260 Feet Assistive device: Rolling walker (2 wheeled) Gait Pattern/deviations: Narrow base of support (less lateral drifting today) Gait velocity: decreased Gait velocity interpretation: Below normal speed for age/gender General Gait Details: Pt with improved balance with ambulation today. Very little lateral sway and no overt LOB. Pt ambulated on 2L O2 [place] and saturations never fell below 95%   Stairs            Wheelchair Mobility     Modified Rankin (Stroke Patients Only)       Balance Overall balance assessment: History of Falls                                  Cognition Arousal/Alertness: Awake/alert Behavior During Therapy: WFL for tasks assessed/performed Overall Cognitive Status: History of cognitive impairments - at baseline                      Exercises      General Comments        Pertinent Vitals/Pain Pain Assessment: No/denies pain    Home Living                      Prior Function            PT Goals (current goals can now be found in the care plan section) Acute Rehab PT Goals Patient Stated Goal: to go for a walk PT Goal Formulation: With patient Time For Goal Achievement: 08/15/15 Potential to Achieve Goals: Good Progress towards PT goals: Progressing toward goals    Frequency  Min 2X/week    PT Plan Current plan remains appropriate    Co-evaluation             End of Session Equipment Utilized During Treatment: Gait belt;Oxygen Activity Tolerance: Patient tolerated treatment well Patient left: in bed;with call bell/phone within reach;with bed alarm set;with family/visitor present  Time: [id number] PT Time Calculation (min) (ACUTE ONLY): 14 min  Charges:                       G CodesBenna [name]:      Howard [name] 08/03/2015, 11:29 AM  Howard [name], SPT. [phone]

## 2015-08-03 NOTE — Care Management (Signed)
For EGD this afternoon. WIll need resumtion of home health orders at discharge. Services already  dy established with [facility]

## 2015-08-03 NOTE — Progress Notes (Signed)
GI Note:  EGD today:  Grade 3 varices.  Banded. Nearly completely eradicated.  Also portal HTN gastropathy.   Recs: - clear liquid diet for 24 hours, then soft mechanical diet for 7 days.  - start nadolol 40 mg daily, titrate to HR of 60. - enema ok since no stool in several days - obtain KUB and if normal bowel gas pattern increase lactulose to 30 q6.

## 2015-08-03 NOTE — Progress Notes (Signed)
Called Dr. Shelle Ironein and notified him that patient Howard [name] platelets are 57. No new orders given, EGD will be done this afternoon as scheduled.

## 2015-08-03 NOTE — Transfer of Care (Signed)
Immediate Anesthesia Transfer of Care Note  Patient: Howard [name]  Procedure(s) Performed: Procedure(s): ESOPHAGOGASTRODUODENOSCOPY (EGD) WITH PROPOFOL (N/A)  Patient Location: PACU  Anesthesia Type:General  Level of Consciousness: awake, alert  and oriented  Airway & Oxygen Therapy: Patient Spontanous Breathing and Patient connected to face mask oxygen  Post-op Assessment: Report given to RN and Post -op Vital signs reviewed and stable  Post vital signs: Reviewed and stable  Last Vitals:  Filed Vitals:   08/03/15 1425  BP: 154/88  Pulse: 70  Temp: 35.9 C  Resp: 18    Complications: No apparent anesthesia complications

## 2015-08-03 NOTE — Anesthesia Preprocedure Evaluation (Signed)
Anesthesia Evaluation  Patient identified by MRN, date of birth, ID band Patient awake    Reviewed: Allergy & Precautions, NPO status , Patient's Chart, lab work & pertinent test results  Airway Mallampati: III  TM Distance: >3 FB Neck ROM: Full    Dental  (+) Chipped, Poor Dentition   Pulmonary shortness of breath and with exertion,    Pulmonary exam normal breath sounds clear to auscultation       Cardiovascular hypertension, Pt. on medications + DOE  Normal cardiovascular exam     Neuro/Psych Depression Schizophrenia negative neurological ROS     GI/Hepatic (+) Cirrhosis       , Ascites Hx   Endo/Other  diabetes, Well Controlled, Type 2, Oral Hypoglycemic Agents  Renal/GU ARFRenal diseasePatient admitted with ARF, creatinines now resolved  negative genitourinary   Musculoskeletal negative musculoskeletal ROS (+)   Abdominal Normal abdominal exam  (+)   Peds negative pediatric ROS (+)  Hematology Thrombocytopenia with PLTs of 57K   Anesthesia Other Findings obese  Reproductive/Obstetrics                             Anesthesia Physical Anesthesia Plan  ASA: III  Anesthesia Plan: General   Post-op Pain Management:    Induction: Intravenous  Airway Management Planned: Nasal Cannula  Additional Equipment:   Intra-op Plan:   Post-operative Plan:   Informed Consent: I have reviewed the patients History and Physical, chart, labs and discussed the procedure including the risks, benefits and alternatives for the proposed anesthesia with the patient or authorized representative who has indicated his/her understanding and acceptance.   Dental advisory given  Plan Discussed with: CRNA and Surgeon  Anesthesia Plan Comments:         Anesthesia Quick Evaluation

## 2015-08-03 NOTE — H&P (View-Only) (Signed)
GI Inpatient Follow-up Note  Patient Identification: Howard [name] is a 34 y.o. male with ETOH cirrhosis c/b ascites.   Subjective:  No stool in 2 days. Denies abd pain n/v, f/c.   No ascites on u/s.  Hgb stable.  Tolerating PO well.   Scheduled Inpatient Medications:  . citalopram  20 mg Oral Daily  . docusate sodium  100 mg Oral BID  . insulin aspart  0-9 Units Subcutaneous TID WC  . lactulose  30 g Oral Q6H  . multivitamin with minerals  1 tablet Oral Daily  . OLANZapine  15 mg Oral QHS  . pantoprazole (PROTONIX) IV  40 mg Intravenous Q12H  . rifaximin  550 mg Oral BID    Continuous Inpatient Infusions:   . sodium chloride 1 mL (08/02/15 0353)    PRN Inpatient Medications:  albuterol, ondansetron **OR** ondansetron (ZOFRAN) IV, oxyCODONE, senna-docusate    Physical Examination: BP 138/76 mmHg  Pulse 95  Temp(Src) 98.4 F (36.9 C) (Oral)  Resp 18  Ht 6\' 3"  (1.905 m)  Wt 128.368 kg (283 lb)  BMI 35.37 kg/m2  SpO2 95% Gen: NAD, alert and oriented x 3 HEENT: PEERLA, EOMI, Neck: supple, no JVD or thyromegaly Chest: CTA bilaterally, no wheezes, crackles, or other adventitious sounds CV: RRR, no m/g/c/r Abd: soft, NT, ND, +BS in all four quadrants; no HSM, guarding, ridigity, or rebound tenderness, + obese abdomen.  Ext: no edema, well perfused with 2+ pulses, Skin: no rash or lesions noted Lymph: no LAD  Data: Lab Results  Component Value Date   WBC 8.3 08/02/2015   HGB 13.0 08/02/2015   HCT 39.6* 08/02/2015   MCV 97.7 08/02/2015   PLT 68* 08/02/2015    Recent Labs Lab 07/31/15 1430 08/01/15 0515 08/02/15 0550  HGB 12.9* 13.4 13.0   Lab Results  Component Value Date   NA 135 08/02/2015   K 4.1 08/02/2015   CL 107 08/02/2015   CO2 20* 08/02/2015   BUN 30* 08/02/2015   CREATININE 1.06 08/02/2015   Lab Results  Component Value Date   ALT 94* 08/01/2015   AST 105* 08/01/2015   ALKPHOS 76 08/01/2015   BILITOT 1.7* 08/01/2015    Recent  Labs Lab 07/30/15 1238  APTT 36  INR 1.29   Assessment/Plan: [name] is a 34 y.o. male with ETOH cirrhosis a/w AKI due to diuretics.  Doing better today but not stool in 48 hours.   Recommendations: - increase lactulose to 30 g q8 - EGD tomorrow for stable melena  - npo mn - low Na diet - will d/c home with NO diuretics given ascites is gone.   Please call with questions or concerns.  REIN, Addison NaegeliMATTHEW GORDON, MD

## 2015-08-03 NOTE — Interval H&P Note (Signed)
History and Physical Interval Note:  08/03/2015 2:44 PM  Howard [name]  has presented today for surgery, with the diagnosis of melena  The various methods of treatment have been discussed with the patient and family. After consideration of risks, benefits and other options for treatment, the patient has consented to  Procedure(s): ESOPHAGOGASTRODUODENOSCOPY (EGD) WITH PROPOFOL (N/A) as a surgical intervention .  The patient's history has been reviewed, patient examined, no change in status, stable for surgery.  I have reviewed the patient's chart and labs.  Questions were answered to the patient's satisfaction.     Merdis Snodgrass GORDON

## 2015-08-03 NOTE — Progress Notes (Signed)
[facility] [place] [facility]   PATIENT NAME: Howard [name]    MR#:  [id number]  DATE OF BIRTH:  1981/08/29  SUBJECTIVE:  CHIEF COMPLAINT:   Chief Complaint  Patient presents with  . GI Bleeding   -Patient was recently admitted to the hospital last month for new onset alcoholic liver cirrhosis and also ascites and had paracentesis done at that time. Seen GI as outpatient  - presented to the hospital for weakness and noted to have acute renal failure and hyperkalemia. - Today patient is lethargic but arousable to verbal commands, no BM for 2-3 days   REVIEW OF SYSTEMS:  Review of Systems  Unable to perform ROS   DRUG ALLERGIES:  No Known Allergies  VITALS:  Blood pressure 130/73, pulse 72, temperature 98.9 F (37.2 C), temperature source Oral, resp. rate 20, height 6\' 3"  (1.905 m), weight 127.007 kg (280 lb), SpO2 96 %.  PHYSICAL EXAMINATION:  Physical Exam  GENERAL:  34 y.o.-year-old obese patient lying in the bed with no acute distress.  EYES: Pupils equal, round, reactive to light and accommodation. No scleral icterus. Extraocular muscles intact.  HEENT: Head atraumatic, normocephalic. Oropharynx and nasopharynx clear.  NECK:  Supple, no jugular venous distention. No thyroid enlargement, no tenderness.  LUNGS: Normal breath sounds bilaterally, no wheezing, rales,rhonchi or crepitation. No use of accessory muscles of respiration.  CARDIOVASCULAR: S1, S2 normal. No murmurs, rubs, or gallops.  ABDOMEN: Soft, nontender, nondistended. Bowel sounds present. No organomegaly or mass.  EXTREMITIES: No pedal edema, cyanosis, or clubbing.  NEUROLOGIC: pt is very lethargic  PSYCHIATRIC: The patient is lethargic  SKIN: No obvious rash, lesion, or ulcer.    LABORATORY PANEL:   CBC  Recent Labs Lab 08/03/15 0458  WBC 7.8  HGB 13.2  HCT 39.2*  PLT 57*    ------------------------------------------------------------------------------------------------------------------  Chemistries   Recent Labs Lab 07/30/15 1934  08/01/15 0515 08/02/15 0550  NA 138  < > 140 135  K 5.2*  < > 4.6 4.1  CL 107  < > 113* 107  CO2 18*  < > 20* 20*  GLUCOSE 76  < > 96 105*  BUN 81*  < > 49* 30*  CREATININE 2.98*  < > 1.45* 1.06  CALCIUM 9.8  < > 10.0 9.8  MG 1.6*  --   --   --   AST  --   --  105*  --   ALT  --   --  94*  --   ALKPHOS  --   --  76  --   BILITOT  --   --  1.7*  --   < > = values in this interval not displayed. ------------------------------------------------------------------------------------------------------------------  Cardiac Enzymes No results for input(s): TROPONINI in the last 168 hours. ------------------------------------------------------------------------------------------------------------------  RADIOLOGY:  Dg Abd 1 View  08/03/2015  CLINICAL DATA:  Constipation.  Hematuria . EXAM: ABDOMEN - 1 VIEW COMPARISON:  Ultrasound 08/01/2015 . FINDINGS: Left base subsegmental atelectasis versus infiltrate. Air-filled loops of small and large bowel are noted. Air-filled loops of small bowel are slightly prominent. Mild adynamic ileus cannot be excluded . Follow-up abdominal series is suggested to demonstrate resolution . Prominent amount of stool noted throughout the colon. Constipation cannot be excluded. No pathologic intra-abdominal calcifications. No acute bony abnormality . IMPRESSION: 1.  Mild left base subsegmental atelectasis versus infiltrate. 2. Air-filled loops of small and large bowel noted. Air-filled loops small bowel are slightly prominent. Mild adynamic ileus cannot be  excluded. Follow-up abdominal series is suggested to demonstrate resolution. Prominent amount of stool in the colon. Constipation cannot be excluded . Electronically Signed   By: Maisie Fus  Register   On: 08/03/2015 16:42    EKG:   Orders placed or  performed during the hospital encounter of 07/30/15  . ED EKG  . ED EKG    ASSESSMENT AND PLAN:   33 year old male with past medical history significant for hypertension, depression, schizophrenia and alcohol abuse and recent admission in October 2016 for alcoholic liver disease with ascites and was discharged presents to the hospital secondary to weakness and coffee-ground emesis.  #1 acute renal failure with hyperkalemia-likely prerenal and ATN -Potassium was corrected and improved  -Creatinine is back to normal -Nephrology is recommending continue to hold his Aldactone, losartan and also Lasix. Not recommending to discharge with any diuretics as the patient does not have any ascites per ultrasound -Discontinued IV fluids, urine output is improved and renal function improved - Appreciate nephrology consult. Hold other nephrotoxins-signed off -Pending SPEP and UPEP  #2 hematemesis and melena-hemoglobin is stable. -Seen by Dr. Shelle Iron  and had  EGD today  -grade 3 varices s/p banding - liquid diet for 24 hrs and mech soft for a week -Repeat hemoglobin am . -u/s - with no ascites -Continue Protonix  #3 liver cirrhosis-alcoholic liver cirrhosis. Diagnosed last month. -Ascites was drained, greater than 4.5 L last month. -Hold Lasix and Aldactone at this time. -Ammonia is 77 -increase lactulose to 30 g by mouth every 6 hours -on rifaximin and also follow-up ammonia tomorrow a.m. -thrombocytopenia 2/2 cirrhosis,no bleeding  #4 schizophrenia-seen by psychiatric last admission. -Continue Zyprexa -Also continue Celexa  #5 DVT prophylaxis-Ted's and SCDs. -We will hold heparin products due to his GI bleed and thrombocytopenia    All the records are reviewed and case discussed with Care Management/Social Workerr. Management plans discussed with the patient, family and they are in agreement.  CODE STATUS: Full code  TOTAL TIME TAKING CARE OF THIS PATIENT: 35 minutes.   POSSIBLE D/C  IN am after EGD , DEPENDING ON CLINICAL CONDITION.   Ramonita Lab M.D on 08/03/2015 at 10:57 PM  Between 7am to 6pm - Pager - [phone]  After 6pm go to [email or web address] - password [email or web address] [facility]  [facility]  Office  [phone]  CC: Primary care physician; Imelda Pillow, NP

## 2015-08-03 NOTE — Op Note (Signed)
[facility] Patient Name: Howard [name] Procedure Date: 08/03/2015 2:46 PM MRN: [id number] Account #: [id number] Date of Birth: August 07, 1981 Admit Type: Inpatient Age: 34 Room: [facility] ENDO ROOM 2 Gender: Male Note Status: Finalized Procedure:         Upper GI endoscopy Indications:       Melena, Cirrhosis rule out esophageal varices Patient Profile:   This is a 34 year old male. Providers:         Rhona Raider. Shelle Iron, MD Referring MD:      Ramonita Lab (Referring MD) Medicines:         Propofol per Anesthesia Complications:     No immediate complications. Procedure:         Pre-Anesthesia Assessment:                    - Prior to the procedure, a History and Physical was                     performed, and patient medications, allergies and                     sensitivities were reviewed. The patient's tolerance of                     previous anesthesia was reviewed.                    After obtaining informed consent, the endoscope was passed                     under direct vision. Throughout the procedure, the                     patient's blood pressure, pulse, and oxygen saturations                     were monitored continuously. The Endoscope was introduced                     through the mouth, and advanced to the second part of                     duodenum. The upper GI endoscopy was accomplished without                     difficulty. The patient tolerated the procedure well. Findings:      Grade III varices were found in the lower third and middle third of the       esophagus. Five bands were successfully placed with near complete       eradication of varices. There was no bleeding at the end of the       procedure.      Moderate portal hypertensive gastropathy was found in the stomach.      The examined duodenum was normal. Impression:        - Grade III esophageal varices. Nearly completely                     eradicated. Banded.          - Portal hypertensive gastropathy.                    - Normal examined duodenum.                    -  No specimens collected. Recommendation:    - Observe patient in GI recovery unit.                    - Clear liquid diet for 1 day.                    - Continue present medications.                    - Start nadolol 40 mg daily with dosage titrated by the                     heart rate.                    - Repeat the upper endoscopy in 3 weeks for retreatment.                    - The findings and recommendations were discussed with the                     patient.                    - The findings and recommendations were discussed with the                     patient's family. Procedure Code(s): --- Professional ---                    [id number], Esophagogastroduodenoscopy, flexible, transoral;                     with band ligation of esophageal/gastric varices Diagnosis Code(s): --- Professional ---                    K74.60, Unspecified cirrhosis of liver                    I85.10, Secondary esophageal varices without bleeding                    K76.6, Portal hypertension                    K31.89, Other diseases of stomach and duodenum                    K92.1, Melena CPT copyright 2014 American Medical Association. All rights reserved. The codes documented in this report are preliminary and upon coder review may  be revised to meet current compliance requirements. Kathalene FramesMatthew G Arionna Hoggard, MD 08/03/2015 3:25:48 PM This report has been signed electronically. Number of Addenda: 0 Note Initiated On: 08/03/2015 2:46 PM      [facility]

## 2015-08-04 ENCOUNTER — Encounter: Payer: Self-pay | Admitting: Gastroenterology

## 2015-08-04 LAB — BASIC METABOLIC PANEL
ANION GAP: 10 (ref 5–15)
BUN: 23 mg/dL — ABNORMAL HIGH (ref 6–20)
CO2: 21 mmol/L — AB (ref 22–32)
Calcium: 9.6 mg/dL (ref 8.9–10.3)
Chloride: 104 mmol/L (ref 101–111)
Creatinine, Ser: 0.93 mg/dL (ref 0.61–1.24)
GFR calc Af Amer: >60 mL/min (ref >60)
GLUCOSE: 94 mg/dL (ref 65–99)
POTASSIUM: 4.2 mmol/L (ref 3.5–5.1)
Sodium: 135 mmol/L (ref 135–145)

## 2015-08-04 LAB — PROTEIN / CREATININE RATIO, URINE
Creatinine, Urine: 118 mg/dL
PROTEIN CREATININE RATIO: 0.31 mg/mg{creat} — AB (ref 0.00–0.15)
Total Protein, Urine: 37 mg/dL

## 2015-08-04 LAB — GLUCOSE, CAPILLARY
GLUCOSE-CAPILLARY: 93 mg/dL (ref 65–99)
Glucose-Capillary: 89 mg/dL (ref 65–99)

## 2015-08-04 LAB — AMMONIA: AMMONIA: 51 umol/L — AB (ref 9–35)

## 2015-08-04 MED ORDER — NADOLOL 40 MG PO TABS: 80.0000 mg | ORAL_TABLET | Freq: Every day | ORAL | Status: DC

## 2015-08-04 MED ORDER — LACTULOSE 10 GM/15ML PO SOLN: 30.0000 g | Freq: Four times a day (QID) | ORAL | Status: DC

## 2015-08-04 MED ORDER — ADULT MULTIVITAMIN W/MINERALS CH
1.0000 | ORAL_TABLET | Freq: Every day | ORAL | Status: DC
Start: 2015-08-04 — End: 2017-01-15

## 2015-08-04 MED ORDER — NADOLOL 40 MG PO TABS: 40.0000 mg | ORAL_TABLET | Freq: Every day | ORAL | Status: DC

## 2015-08-04 MED ORDER — RIFAXIMIN 550 MG PO TABS: 550.0000 mg | ORAL_TABLET | Freq: Two times a day (BID) | ORAL | Status: DC

## 2015-08-04 NOTE — Progress Notes (Signed)
Patient discharged to home as ordered, and patient instructed to make a follow up appointment with Howard Pillowhelsa Holland NP in one week telephone number [phone], [facility] 09/05/2015 At 0930 am. Prescriptions given to patient as ordered. Patient voided 200cc's of urine prior to discharge. Patient is alert and oriented, mother at the bedside to take patient home.

## 2015-08-04 NOTE — Care Management Note (Signed)
Case Management Note  Patient Details  Name: Andee LinemanJoey A Sleeper MRN: [id number] Date of Birth: Jul 22, 1981  Subjective/Objective:      MD Order to resume home health orders faxed and called to [facility]. Mr Rennie PlowmanMurray's Mother will bring his portable oxygen tank from home to him today prior to discharge home.               Action/Plan:   Expected Discharge Date:                  Expected Discharge Plan:  Home w Home Health Services  In-House Referral:     Discharge planning Services  CM Consult  Post Acute Care Choice:    Choice offered to:     DME Arranged:    DME Agency:  [facility]  HH Arranged:    HH Agency:  [facility] (Resumtion of Home Health order at Discharge.)  Status of Service:  In process, will continue to follow  Medicare Important Message Given:  Yes Date Medicare IM Given:    Medicare IM give by:    Date Additional Medicare IM Given:    Additional Medicare Important Message give by:     If discussed at Long Length of Stay Meetings, dates discussed:    Additional Comments:  Cayce Quezada A, RN 08/04/2015, 11:16 AM

## 2015-08-04 NOTE — Consult Note (Signed)
  GI Inpatient Follow-up Note  Patient Identification: Howard [name] is a 34 [name].o. male with recent UGI bleeding. Hx of esophageal varices, s/p variceal banding yesterday by Dr. Shelle [name].  Subjective: Mild chest discomfort from esophageal banding. No rectal bleeding. Tolerating liquids.  Scheduled Inpatient Medications:  . citalopram  20 mg Oral Daily  . docusate sodium  100 mg Oral BID  . insulin aspart  0-9 Units Subcutaneous TID WC  . lactulose  30 g Oral Q6H  . multivitamin with minerals  1 tablet Oral Daily  . nadolol  40 mg Oral Daily  . OLANZapine  15 mg Oral QHS  . pantoprazole (PROTONIX) IV  40 mg Intravenous Q12H  . rifaximin  550 mg Oral BID    Continuous Inpatient Infusions:     PRN Inpatient Medications:  albuterol, ondansetron **OR** ondansetron (ZOFRAN) IV, oxyCODONE, senna-docusate  Review of Systems: Constitutional: Weight is stable.  Eyes: No changes in vision. ENT: No oral lesions, sore throat.  GI: see HPI.  Heme/Lymph: No easy bruising.  CV: No chest pain.  GU: No hematuria.  Integumentary: No rashes.  Neuro: No headaches.  Psych: No depression/anxiety.  Endocrine: No heat/cold intolerance.  Allergic/Immunologic: No urticaria.  Resp: No cough, SOB.  Musculoskeletal: No joint swelling.    Physical Examination: BP 121/77 mmHg  Pulse 77  Temp(Src) 98.3 F (36.8 C) (Oral)  Resp 16  Ht 6\' 3"  (1.905 m)  Wt 127.007 kg (280 lb)  BMI 35.00 kg/m2  SpO2 94% Gen: NAD, alert and oriented x 4 HEENT: PEERLA, EOMI, Neck: supple, no JVD or thyromegaly Chest: CTA bilaterally, no wheezes, crackles, or other adventitious sounds CV: RRR, no m/g/c/r Abd: soft, NT, ND, +BS in all four quadrants; no HSM, guarding, ridigity, or rebound tenderness Ext: no edema, well perfused with 2+ pulses, Skin: no rash or lesions noted Lymph: no LAD  Data: Lab Results  Component Value Date   WBC 7.8 08/03/2015   HGB 13.2 08/03/2015   HCT 39.2* 08/03/2015   MCV 98.5  08/03/2015   PLT 57* 08/03/2015    Recent Labs Lab 08/01/15 0515 08/02/15 0550 08/03/15 0458  HGB 13.4 13.0 13.2   Lab Results  Component Value Date   NA 135 08/04/2015   K 4.2 08/04/2015   CL 104 08/04/2015   CO2 21* 08/04/2015   BUN 23* 08/04/2015   CREATININE 0.93 08/04/2015   Lab Results  Component Value Date   ALT 94* 08/01/2015   AST 105* 08/01/2015   ALKPHOS 76 08/01/2015   BILITOT 1.7* 08/01/2015    Recent Labs Lab 07/30/15 1238  APTT 36  INR 1.29   Assessment/Plan: [name] is a 34 [name].o. male with liver cirrhosis and esophageal varices. S/P variceal banding.  Recommendations: Advance to soft diet. Needs to stay on soft diet for next few days to prevent premature falling off of esophageal bands. If stable, pt can be discharged to home daily nadolol. Pt can f/u with Dr. Shelle [name] in office in few weeks. Thanks. Please call with questions or concerns.  Howard [name], Howard StandingPAUL Y, MD

## 2015-08-04 NOTE — Anesthesia Postprocedure Evaluation (Signed)
  Anesthesia Post-op Note  Patient: Howard [name]  Procedure(s) Performed: Procedure(s): ESOPHAGOGASTRODUODENOSCOPY (EGD) WITH PROPOFOL (N/A)  Anesthesia type:General  Patient location: PACU  Post pain: Pain level controlled  Post assessment: Post-op Vital signs reviewed, Patient's Cardiovascular Status Stable, Respiratory Function Stable, Patent Airway and No signs of Nausea or vomiting  Post vital signs: Reviewed and stable  Last Vitals:  Filed Vitals:   08/04/15 0416  BP: 121/77  Pulse: 77  Temp: 36.8 C  Resp: 16    Level of consciousness: awake, alert  and patient cooperative  Complications: No apparent anesthesia complications

## 2015-08-06 LAB — UIFE/LIGHT CHAINS/TP QN, 24-HR UR
% BETA, URINE: 18.6 %
ALBUMIN, U: 46.6 %
ALPHA 1 URINE: 1.9 %
ALPHA 2 UR: 9.1 %
FREE LAMBDA LT CHAINS, UR: 7.95 mg/L — AB (ref 0.24–6.66)
Free Kappa/Lambda Ratio: 11.86 — ABNORMAL HIGH (ref 2.04–10.37)
Free Lt Chn Excr Rate: 94.3 mg/L — ABNORMAL HIGH (ref 1.35–24.19)
GAMMA GLOBULIN URINE: 23.9 %
TOTAL PROTEIN, URINE-UR/DAY: 399 mg/(24.h) — AB (ref 30.0–150.0)
Time: 830 hours
Total Protein, Urine: 38 mg/dL
VOLUME, URINE-UPE24: 1050 mL

## 2015-08-07 NOTE — Discharge Instructions (Signed)
Gastrointestinal Bleeding Gastrointestinal bleeding is bleeding somewhere along the path that food travels through the body (digestive tract). This path is anywhere between the mouth and the opening of the butt (anus). You may have blood in your throw up (vomit) or in your poop (stools). If there is a lot of bleeding, you may need to stay in the hospital. HOME CARE  Only take medicine as told by your doctor.  Eat foods with fiber such as whole grains, fruits, and vegetables. You can also try eating 1 to 3 prunes a day.  Drink enough fluids to keep your pee (urine) clear or pale yellow. GET HELP RIGHT AWAY IF:   Your bleeding gets worse.  You feel dizzy, weak, or you pass out (faint).  You have bad cramps in your back or belly (abdomen).  You have large blood clumps (clots) in your poop.  Your problems are getting worse. MAKE SURE YOU:   Understand these instructions.  Will watch your condition.  Will get help right away if you are not doing well or get worse.   This information is not intended to replace advice given to you by your health care provider. Make sure you discuss any questions you have with your health care provider.   Document Released: 06/17/2008 Document Revised: 08/25/2012 Document Reviewed: 02/26/2015 Elsevier Interactive Patient Education 2016 [employer]   Activity as tolerated  Diet -ada F/u with pcpc and GI as recommended

## 2015-08-07 NOTE — Progress Notes (Signed)
pts XIFAXAN 550 mg is not covered under his insurance per [facility] at [phone] . Will notify GI and try to call Medicare/[employer] insurance- [phone]-   Pt ID [id number]

## 2015-08-07 NOTE — Discharge Summary (Signed)
[facility] [place] at [facility]   PATIENT NAME: Howard [name]    MR#:  [id number]  DATE OF BIRTH:  Feb 10, 1981  DATE OF ADMISSION:  07/30/2015 ADMITTING PHYSICIAN: Enedina Finner, MD  DATE OF DISCHARGE: 08/04/2015  5:33 PM  PRIMARY CARE PHYSICIAN: HOLLAND, CHELSA, NP    ADMISSION DIAGNOSIS:  Acute hyperkalemia [E87.5] Gastrointestinal hemorrhage with melena [K92.1] Acute renal failure, unspecified acute renal failure type (HCC) [N17.9]  DISCHARGE DIAGNOSIS:  Active Problems:   Acute renal failure (ARF) (HCC) GI hemorhage and  Melena Alcoholic liver cirrhosis with hepatic encephalopathy SECONDARY DIAGNOSIS:   Past Medical History  Diagnosis Date  . Hypertension   . Schizophrenia (HCC)   . Hyperlipemia   . Depression   . Diabetes (HCC)   . Liver disease   . Diabetes mellitus, type II (HCC)   . Heart disease   . Cirrhosis of liver with ascites [facility])     HOSPITAL COURSE:  34 year old male with past medical history significant for hypertension, depression, schizophrenia and alcohol abuse and recent admission in October 2016 for alcoholic liver disease with ascites and was discharged presents to the hospital secondary to weakness and coffee-ground emesis.  #1 acute renal failure with hyperkalemia-likely prerenal and ATN -Potassium was corrected and improved  -Creatinine is back to normal -Nephrology is recommending continue to hold his Aldactone, losartan and also Lasix. Not recommending to discharge with any diuretics as the patient does not have any ascites per ultrasound -Discontinued IV fluids, urine output is improved and renal function improved - Appreciate nephrology consult. Hold other nephrotoxins-signed off -Pending SPEP and UPEP, PCP to f/u   #2 hematemesis and melena-hemoglobin is stable. -Seen by Dr. Shelle Iron and had EGD - gr 3 varices, banding done - recommended liquid diet for 24 hrs and mech soft for a week -u/s - with no  ascites -Continue Protonix  #3 liver cirrhosis-alcoholic liver cirrhosis. Diagnosed last month. -Ascites was drained, greater than 4.5 L last month. -Ammonia is better -increased lactulose to 30 g by mouth every 6 hours -on rifaximin and also follow-up with GI -OP -thrombocytopenia 2/2 cirrhosis,no bleeding  #4 schizophrenia-seen by psychiatric last admission. -Continue Zyprexa -Also continue Celexa  #5 DVT prophylaxis-Ted's and SCDs. -We will hold heparin products due to his GI bleed and thrombocytopenia     DISCHARGE CONDITIONS:   FAIR  CONSULTS OBTAINED:  Treatment Team:  Lamont Dowdy, MD Elnita Maxwell, MD Ramonita Lab, MD   PROCEDURES egd  DRUG ALLERGIES:  No Known Allergies  DISCHARGE MEDICATIONS:   Discharge Medication List as of 08/04/2015 11:57 AM    START taking these medications   Details  lactulose (CHRONULAC) 10 GM/15ML solution Take 45 mLs (30 g total) by mouth every 6 (six) hours., Starting 08/04/2015, Until Discontinued, Print    Multiple Vitamin (MULTIVITAMIN WITH MINERALS) TABS tablet Take 1 tablet by mouth daily., Starting 08/04/2015, Until Discontinued, Normal    rifaximin (XIFAXAN) 550 MG TABS tablet Take 1 tablet (550 mg total) by mouth 2 (two) times daily., Starting 08/04/2015, Until Discontinued, Print      CONTINUE these medications which have CHANGED   Details  nadolol (CORGARD) 40 MG tablet Take 2 tablets (80 mg total) by mouth daily., Starting 08/04/2015, Until Discontinued, Print      CONTINUE these medications which have NOT CHANGED   Details  albuterol (PROVENTIL HFA;VENTOLIN HFA) 108 (90 BASE) MCG/ACT inhaler Inhale 2 puffs into the lungs every 6 (six) hours as needed for wheezing or  shortness of breath. , Until Discontinued, Historical Med    citalopram (CELEXA) 20 MG tablet Take 20 mg by mouth daily., Until Discontinued, Historical Med    clobetasol cream (TEMOVATE) 0.05 % Apply 1 application topically as needed (for  skin irritation)., Until Discontinued, Historical Med    diphenhydrAMINE (BENADRYL) 25 mg capsule Take 50 mg by mouth at bedtime as needed for sleep., Until Discontinued, Historical Med    furosemide (LASIX) 20 MG tablet Take 1 tablet (20 mg total) by mouth 2 (two) times daily., Starting 07/08/2015, Until Discontinued, Normal    metFORMIN (GLUCOPHAGE) 500 MG tablet Take 1 tablet (500 mg total) by mouth 2 (two) times daily with a meal., Starting 06/01/2015, Until Discontinued, Print    OLANZapine (ZYPREXA) 15 MG tablet Take 15 mg by mouth at bedtime., Until Discontinued, Historical Med    omeprazole (PRILOSEC) 40 MG capsule Take 40 mg by mouth daily. , Until Discontinued, Historical Med    simvastatin (ZOCOR) 40 MG tablet Take 40 mg by mouth at bedtime. , Until Discontinued, Historical Med      STOP taking these medications     aspirin EC 81 MG tablet      losartan-hydrochlorothiazide (HYZAAR) 100-25 MG per tablet      metoprolol succinate (TOPROL-XL) 100 MG 24 hr tablet      spironolactone (ALDACTONE) 100 MG tablet      cefUROXime (CEFTIN) 250 MG tablet          DISCHARGE INSTRUCTIONS:   Activity as tolerated  Diet -ada F/u with pcpc and GI as recommended    DIET:  mech soft diet for a week after 24 hrs liquid diet  DISCHARGE CONDITION:  fair  ACTIVITY:  As tolerated   OXYGEN:  Home Oxygen: none   Oxygen Delivery:  none  DISCHARGE LOCATION:  home  If you experience worsening of your admission symptoms, develop shortness of breath, life threatening emergency, suicidal or homicidal thoughts you must seek medical attention immediately by calling 911 or calling your MD immediately  if symptoms less severe.  You Must read complete instructions/literature along with all the possible adverse reactions/side effects for all the Medicines you take and that have been prescribed to you. Take any new Medicines after you have completely understood and accpet all the possible  adverse reactions/side effects.   Please note  You were cared for by a hospitalist during your hospital stay. If you have any questions about your discharge medications or the care you received while you were in the hospital after you are discharged, you can call the unit and asked to speak with the hospitalist on call if the hospitalist that took care of you is not available. Once you are discharged, your primary care physician will handle any further medical issues. Please note that NO REFILLS for any discharge medications will be authorized once you are discharged, as it is imperative that you return to your primary care physician (or establish a relationship with a primary care physician if you do not have one) for your aftercare needs so that they can reassess your need for medications and monitor your lab values.     Today  Chief Complaint  Patient presents with  . GI Bleeding   Pt is feeling better, no complaints. Awake and alert and answering questions appropriately mom is at bedside  ROS:  CONSTITUTIONAL: Denies fevers, chills. Denies any fatigue, weakness.  EYES: Denies blurry vision, double vision, eye pain. EARS, NOSE, THROAT: Denies tinnitus, ear pain,  hearing loss. RESPIRATORY: Denies cough, wheeze, shortness of breath.  CARDIOVASCULAR: Denies chest pain, palpitations, edema.  GASTROINTESTINAL: Denies nausea, vomiting, diarrhea, abdominal pain. Denies bright red blood per rectum. GENITOURINARY: Denies dysuria, hematuria. ENDOCRINE: Denies nocturia or thyroid problems. HEMATOLOGIC AND LYMPHATIC: Denies easy bruising or bleeding. SKIN: Denies rash or lesion. MUSCULOSKELETAL: Denies pain in neck, back, shoulder, knees, hips or arthritic symptoms.  NEUROLOGIC: Denies paralysis, paresthesias.  PSYCHIATRIC: Denies anxiety or depressive symptoms.   VITAL SIGNS:  Blood pressure 121/77, pulse 77, temperature 98.3 F (36.8 C), temperature source Oral, resp. rate 16, height   (1.905 m), weight 127.007 kg (280 lb), SpO2 94 %.  I/O:  No intake or output data in the 24 hours ending 08/07/15 1105  PHYSICAL EXAMINATION:  GENERAL:  34 y.o.-year-old patient lying in the bed with no acute distress.  EYES: Pupils equal, round, reactive to light and accommodation. No scleral icterus. Extraocular muscles intact.  HEENT: Head atraumatic, normocephalic. Oropharynx and nasopharynx clear.  NECK:  Supple, no jugular venous distention. No thyroid enlargement, no tenderness.  LUNGS: Normal breath sounds bilaterally, no wheezing, rales,rhonchi or crepitation. No use of accessory muscles of respiration.  CARDIOVASCULAR: S1, S2 normal. No murmurs, rubs, or gallops.  ABDOMEN: Soft, non-tender, non-distended. Bowel sounds present. No organomegaly or mass.  EXTREMITIES: No pedal edema, cyanosis, or clubbing.  NEUROLOGIC: Cranial nerves II through XII are intact. Muscle strength 5/5 in all extremities. Sensation intact. Gait not checked.  PSYCHIATRIC: The patient is alert and oriented x 3.  SKIN: No obvious rash, lesion, or ulcer.   DATA REVIEW:   CBC  Recent Labs Lab 08/03/15 0458  WBC 7.8  HGB 13.2  HCT 39.2*  PLT 57*    Chemistries   Recent Labs Lab 08/01/15 0515  08/04/15 0501  NA 140  < > 135  K 4.6  < > 4.2  CL 113*  < > 104  CO2 20*  < > 21*  GLUCOSE 96  < > 94  BUN 49*  < > 23*  CREATININE 1.45*  < > 0.93  CALCIUM 10.0  < > 9.6  AST 105*  --   --   ALT 94*  --   --   ALKPHOS 76  --   --   BILITOT 1.7*  --   --   < > = values in this interval not displayed.  Cardiac Enzymes No results for input(s): TROPONINI in the last 168 hours.  Microbiology Results  Results for orders placed or performed during the hospital encounter of 07/04/15  Body fluid culture     Status: None   Collection Time: 07/04/15  8:41 PM  Result Value Ref Range Status   Specimen Description ABDOMEN  Final   Special Requests NONE  Final   Gram Stain RARE WBC SEEN NO ORGANISMS  SEEN   Final   Culture NO GROWTH 4 DAYS  Final   Report Status 07/09/2015 FINAL  Final    RADIOLOGY:  Dg Abd 1 View  08/03/2015  CLINICAL DATA:  Constipation.  Hematuria . EXAM: ABDOMEN - 1 VIEW COMPARISON:  Ultrasound 08/01/2015 . FINDINGS: Left base subsegmental atelectasis versus infiltrate. Air-filled loops of small and large bowel are noted. Air-filled loops of small bowel are slightly prominent. Mild adynamic ileus cannot be excluded . Follow-up abdominal series is suggested to demonstrate resolution . Prominent amount of stool noted throughout the colon. Constipation cannot be excluded. No pathologic intra-abdominal calcifications. No acute bony abnormality . IMPRESSION: 1.  Mild left base subsegmental atelectasis versus infiltrate. 2. Air-filled loops of small and large bowel noted. Air-filled loops small bowel are slightly prominent. Mild adynamic ileus cannot be excluded. Follow-up abdominal series is suggested to demonstrate resolution. Prominent amount of stool in the colon. Constipation cannot be excluded . Electronically Signed   By: Maisie Fus  Register   On: 08/03/2015 16:42    EKG:   Orders placed or performed during the hospital encounter of 07/30/15  . ED EKG  . ED EKG      Management plans discussed with the patient, family and they are in agreement.  CODE STATUS:   TOTAL TIME TAKING CARE OF THIS PATIENT: 45  minutes.    @[facility] @  on 08/07/2015 at 11:05 AM  Between 7am to 6pm - Pager - [phone]  After 6pm go to [email or web address] - password [email or web address] [facility]  [place] [facility]  Office  [phone]  CC: Primary care physician; Imelda Pillow, NP

## 2015-08-08 ENCOUNTER — Encounter: Payer: Self-pay | Admitting: Gastroenterology

## 2015-08-31 ENCOUNTER — Other Ambulatory Visit: Payer: Self-pay

## 2015-08-31 ENCOUNTER — Ambulatory Visit: Payer: Medicare Other | Admitting: Anesthesiology

## 2015-08-31 ENCOUNTER — Ambulatory Visit
Admission: RE | Admit: 2015-08-31 | Discharge: 2015-08-31 | Disposition: A | Discharge status: Home / Self Care | Payer: Medicare Other | Source: Ambulatory Visit | Attending: Gastroenterology | Admitting: Gastroenterology

## 2015-08-31 ENCOUNTER — Encounter
Admission: RE | Disposition: A | Discharge status: Home / Self Care | Payer: Self-pay | Source: Ambulatory Visit | Attending: Gastroenterology

## 2015-08-31 DIAGNOSIS — K766 Portal hypertension: Secondary | ICD-10-CM | POA: Diagnosis not present

## 2015-08-31 DIAGNOSIS — F209 Schizophrenia, unspecified: Secondary | ICD-10-CM | POA: Insufficient documentation

## 2015-08-31 DIAGNOSIS — Z79899 Other long term (current) drug therapy: Secondary | ICD-10-CM | POA: Insufficient documentation

## 2015-08-31 DIAGNOSIS — E119 Type 2 diabetes mellitus without complications: Secondary | ICD-10-CM | POA: Insufficient documentation

## 2015-08-31 DIAGNOSIS — I10 Essential (primary) hypertension: Secondary | ICD-10-CM | POA: Insufficient documentation

## 2015-08-31 DIAGNOSIS — E785 Hyperlipidemia, unspecified: Secondary | ICD-10-CM | POA: Insufficient documentation

## 2015-08-31 DIAGNOSIS — K259 Gastric ulcer, unspecified as acute or chronic, without hemorrhage or perforation: Secondary | ICD-10-CM | POA: Insufficient documentation

## 2015-08-31 DIAGNOSIS — Z8249 Family history of ischemic heart disease and other diseases of the circulatory system: Secondary | ICD-10-CM | POA: Insufficient documentation

## 2015-08-31 DIAGNOSIS — Z7984 Long term (current) use of oral hypoglycemic drugs: Secondary | ICD-10-CM | POA: Insufficient documentation

## 2015-08-31 DIAGNOSIS — K319 Disease of stomach and duodenum, unspecified: Secondary | ICD-10-CM | POA: Diagnosis not present

## 2015-08-31 DIAGNOSIS — K3189 Other diseases of stomach and duodenum: Secondary | ICD-10-CM | POA: Insufficient documentation

## 2015-08-31 DIAGNOSIS — I8500 Esophageal varices without bleeding: Secondary | ICD-10-CM | POA: Diagnosis present

## 2015-08-31 DIAGNOSIS — F329 Major depressive disorder, single episode, unspecified: Secondary | ICD-10-CM | POA: Diagnosis not present

## 2015-08-31 DIAGNOSIS — Z7951 Long term (current) use of inhaled steroids: Secondary | ICD-10-CM | POA: Diagnosis not present

## 2015-08-31 DIAGNOSIS — K7460 Unspecified cirrhosis of liver: Secondary | ICD-10-CM | POA: Insufficient documentation

## 2015-08-31 DIAGNOSIS — Z823 Family history of stroke: Secondary | ICD-10-CM | POA: Insufficient documentation

## 2015-08-31 HISTORY — PX: ESOPHAGOGASTRODUODENOSCOPY (EGD) WITH PROPOFOL: SHX5813

## 2015-08-31 LAB — GLUCOSE, CAPILLARY: Glucose-Capillary: 74 mg/dL (ref 65–99)

## 2015-08-31 SURGERY — ESOPHAGOGASTRODUODENOSCOPY (EGD) WITH PROPOFOL
Anesthesia: General

## 2015-08-31 MED ORDER — SODIUM CHLORIDE 0.9 % IV SOLN
INTRAVENOUS | Status: DC
  Administered 2015-08-31: 13:00:00 via INTRAVENOUS

## 2015-08-31 MED ORDER — IPRATROPIUM-ALBUTEROL 0.5-2.5 (3) MG/3ML IN SOLN
3.0000 mL | Freq: Once | RESPIRATORY_TRACT | Status: AC
  Administered 2015-08-31: 3 mL via RESPIRATORY_TRACT

## 2015-08-31 MED ORDER — EPHEDRINE SULFATE 50 MG/ML IJ SOLN
INTRAMUSCULAR | Status: DC | PRN
  Administered 2015-08-31: 5 mg via INTRAVENOUS

## 2015-08-31 MED ORDER — PROPOFOL 500 MG/50ML IV EMUL
INTRAVENOUS | Status: DC | PRN
Start: 2015-08-31 — End: 2015-08-31
  Administered 2015-08-31: 150 ug/kg/min via INTRAVENOUS

## 2015-08-31 MED ORDER — PROPOFOL 10 MG/ML IV BOLUS
INTRAVENOUS | Status: DC | PRN
  Administered 2015-08-31: 100 mg via INTRAVENOUS

## 2015-08-31 MED ORDER — FENTANYL CITRATE (PF) 100 MCG/2ML IJ SOLN
INTRAMUSCULAR | Status: DC | PRN
  Administered 2015-08-31: 50 ug via INTRAVENOUS

## 2015-08-31 MED ORDER — MIDAZOLAM HCL 2 MG/2ML IJ SOLN
INTRAMUSCULAR | Status: DC | PRN
  Administered 2015-08-31: 1 mg via INTRAVENOUS

## 2015-08-31 NOTE — Op Note (Signed)
[facility] Gastroenterology Patient Name: Howard BowlerJoey [name] Procedure Date: 08/31/2015 1:10 PM MRN: [id number] Account #: [id number] Date of Birth: [birth year] Admit Type: Outpatient Age: [age] Room[facility] ENDO ROOM 3 Gender: Male Note Status: Finalized Procedure:         Upper GI endoscopy Indications:       For therapy of esophageal varices Patient Profile:   This is a 34 year old male. Providers:         Rhona RaiderMatthew G. Shelle Ironein, MD Referring MD:      Resa Minerhelsa B. Marcelle OverlieHolland (Referring MD) Medicines:         Propofol per Anesthesia Complications:     No immediate complications. Procedure:         Pre-Anesthesia Assessment:                    - Prior to the procedure, a History and Physical was                     performed, and patient medications, allergies and                     sensitivities were reviewed. The patient's tolerance of                     previous anesthesia was reviewed.                    After obtaining informed consent, the endoscope was passed                     under direct vision. Throughout the procedure, the                     patient's blood pressure, pulse, and oxygen saturations                     were monitored continuously. The Endoscope was introduced                     through the mouth, and advanced to the second part of                     duodenum. The patient tolerated the procedure well. Findings:      Grade III varices were found in the lower third of the esophagus. Four       bands were successfully placed with complete eradication, resulting in       deflation of varices. There was no bleeding during the procedure.      Moderate portal hypertensive gastropathy was found in the gastric body.      Multiple localized small erosions were found in the gastric antrum.      The examined duodenum was normal. Impression:        - Grade III esophageal varices. Completely eradicated.                     Banded.                    - Portal  hypertensive gastropathy.                    - Erosive gastropathy.                    - Normal examined duodenum.                    -  No specimens collected. Recommendation:    - Observe patient in GI recovery unit.                    - Clear liquid diet for 1 day.                    - Mechanical soft diet for 3 days after 24 hours of clear                     liquids. .                    - Continue present medications.                    - Continue nadolol at 40 mg daily. HR at 60 today in                     pre-op.                    - The findings and recommendations were discussed with the                     patient.                    - The findings and recommendations were discussed with the                     patient's family.                    - Repeat the upper endoscopy in 4 weeks for retreatment. Procedure Code(s): --- Professional ---                    [id number], Esophagogastroduodenoscopy, flexible, transoral;                     with band ligation of esophageal/gastric varices Diagnosis Code(s): --- Professional ---                    I85.00, Esophageal varices without bleeding                    K31.89, Other diseases of stomach and duodenum CPT copyright 2014 American Medical Association. All rights reserved. The codes documented in this report are preliminary and upon coder review may  be revised to meet current compliance requirements. Kathalene Frames, MD 08/31/2015 1:38:06 PM This report has been signed electronically. Number of Addenda: 0 Note Initiated On: 08/31/2015 1:10 PM      [facility]

## 2015-08-31 NOTE — H&P (Signed)
Primary Care Physician:  Imelda Pillow, NP  Pre-Procedure History & Physical: HPI:  Howard [name] is a 34 y.o. male is here for an endoscopy.   Past Medical History  Diagnosis Date  . Hypertension   . Schizophrenia (HCC)   . Hyperlipemia   . Depression   . Diabetes (HCC)   . Liver disease   . Diabetes mellitus, type II (HCC)   . Heart disease   . Cirrhosis of liver with ascites [facility])     Past Surgical History  Procedure Laterality Date  . No past surgeries    . Esophagogastroduodenoscopy (egd) with propofol N/A 08/03/2015    Procedure: ESOPHAGOGASTRODUODENOSCOPY (EGD) WITH PROPOFOL;  Surgeon: Elnita Maxwell, MD;  Location: [facility] ENDOSCOPY;  Service: Endoscopy;  Laterality: N/A;    Prior to Admission medications   Medication Sig Start Date End Date Taking? Authorizing Provider  albuterol (PROVENTIL HFA;VENTOLIN HFA) 108 (90 BASE) MCG/ACT inhaler Inhale 2 puffs into the lungs every 6 (six) hours as needed for wheezing or shortness of breath.    Yes Historical Provider, MD  citalopram (CELEXA) 20 MG tablet Take 20 mg by mouth daily.   Yes Historical Provider, MD  diphenhydrAMINE (BENADRYL) 25 mg capsule Take 50 mg by mouth at bedtime as needed for sleep.   Yes Historical Provider, MD  furosemide (LASIX) 20 MG tablet Take 1 tablet (20 mg total) by mouth 2 (two) times daily. 07/08/15  Yes Sital Mody, MD  lactulose (CHRONULAC) 10 GM/15ML solution Take 45 mLs (30 g total) by mouth every 6 (six) hours. 08/04/15  Yes Ramonita Lab, MD  metFORMIN (GLUCOPHAGE) 500 MG tablet Take 1 tablet (500 mg total) by mouth 2 (two) times daily with a meal. 06/01/15  Yes Jolanta B Pucilowska, MD  Multiple Vitamin (MULTIVITAMIN WITH MINERALS) TABS tablet Take 1 tablet by mouth daily. 08/04/15  Yes Ramonita Lab, MD  nadolol (CORGARD) 40 MG tablet Take 2 tablets (80 mg total) by mouth daily. 08/04/15  Yes Ramonita Lab, MD  OLANZapine (ZYPREXA) 15 MG tablet Take 15 mg by mouth at bedtime.   Yes Historical  Provider, MD  omeprazole (PRILOSEC) 40 MG capsule Take 40 mg by mouth daily.    Yes Historical Provider, MD  rifaximin (XIFAXAN) 550 MG TABS tablet Take 1 tablet (550 mg total) by mouth 2 (two) times daily. 08/04/15  Yes Ramonita Lab, MD  simvastatin (ZOCOR) 40 MG tablet Take 40 mg by mouth at bedtime.    Yes Historical Provider, MD  clobetasol cream (TEMOVATE) 0.05 % Apply 1 application topically as needed (for skin irritation).    Historical Provider, MD    Allergies as of 08/30/2015  . (No Known Allergies)    Family History  Problem Relation Age of Onset  . Heart disease Mother   . Hypertension Mother   . Hyperlipidemia Mother   . Stroke Father   . Heart attack Father   . Hypertension Father   . Heart disease Brother     Social History   Social History  . Marital Status: Single    Spouse Name: N/A  . Number of Children: N/A  . Years of Education: N/A   Occupational History  . Not on file.   Social History Main Topics  . Smoking status: Never Smoker   . Smokeless tobacco: Never Used  . Alcohol Use: No     Comment: states quit drinking 1 month ago.  . Drug Use: No  . Sexual Activity: No   Other Topics  Concern  . Not on file   Social History Narrative   ** Merged History Encounter **         Physical Exam: BP 130/73 mmHg  Pulse 62  Temp(Src) 98.1 F (36.7 C) (Tympanic)  Resp 18  Wt 127.007 kg (280 lb)  SpO2 98% General:   Alert,  pleasant and cooperative in NAD Head:  Normocephalic and atraumatic. Neck:  Supple; no masses or thyromegaly. Lungs:  Clear throughout to auscultation.    Heart:  Regular rate and rhythm. Abdomen:  Soft, nontender and nondistended. Normal bowel sounds, without guarding, and without rebound.   Neurologic:  Alert and  oriented x4;  grossly normal neurologically.  Impression/Plan: [name] is here for an endoscopy to be performed for retreat esophageal varices  Risks, benefits, limitations, and alternatives regarding   endoscopy have been reviewed with the patient.  Questions have been answered.  All parties agreeable.   Elnita MaxwellEIN, Noorah Giammona GORDON, MD  08/31/2015, 1:15 PM

## 2015-08-31 NOTE — Discharge Instructions (Signed)

## 2015-08-31 NOTE — Anesthesia Postprocedure Evaluation (Signed)
Anesthesia Post Note  Patient: Howard [name]  Procedure(s) Performed: Procedure(s) (LRB): ESOPHAGOGASTRODUODENOSCOPY (EGD) WITH PROPOFOL (N/A)  Patient location during evaluation: Endoscopy Anesthesia Type: General Level of consciousness: awake and alert Pain management: pain level controlled Vital Signs Assessment: post-procedure vital signs reviewed and stable Respiratory status: spontaneous breathing, nonlabored ventilation, respiratory function stable and patient connected to nasal cannula oxygen Cardiovascular status: blood pressure returned to baseline and stable Postop Assessment: no signs of nausea or vomiting Anesthetic complications: no    Last Vitals:  Filed Vitals:   08/31/15 1359 08/31/15 1409  BP: 139/83 143/92  Pulse: 69 65  Temp:    Resp: 23 20    Last Pain:  Filed Vitals:   08/31/15 1410  PainSc: 5                  Myles Mallicoat K Ricketta Colantonio

## 2015-08-31 NOTE — Transfer of Care (Signed)
Immediate Anesthesia Transfer of Care Note  Patient: Howard [name]  Procedure(s) Performed: Procedure(s): ESOPHAGOGASTRODUODENOSCOPY (EGD) WITH PROPOFOL (N/A)  Patient Location: PACU  Anesthesia Type:General  Level of Consciousness: awake, alert  and oriented  Airway & Oxygen Therapy: Patient Spontanous Breathing and Patient connected to nasal cannula oxygen  Post-op Assessment: Report given to RN, Post -op Vital signs reviewed and stable and Patient moving all extremities  Post vital signs: Reviewed and stable  Last Vitals:  Filed Vitals:   08/31/15 1235 08/31/15 1339  BP: 130/73 141/83  Pulse: 62 73  Temp: 36.7 C 36.2 C  Resp: 18 15    Complications: No apparent anesthesia complications

## 2015-08-31 NOTE — Anesthesia Preprocedure Evaluation (Signed)
Anesthesia Evaluation  Patient identified by MRN, date of birth, ID band Patient awake    Reviewed: Allergy & Precautions, H&P , NPO status , Patient's Chart, lab work & pertinent test results  History of Anesthesia Complications Negative for: history of anesthetic complications  Airway Mallampati: III  TM Distance: >3 FB Neck ROM: full    Dental  (+) Poor Dentition, Chipped, Missing   Pulmonary neg shortness of breath, asthma ,    Pulmonary exam normal breath sounds clear to auscultation       Cardiovascular Exercise Tolerance: Good hypertension, (-) angina(-) Past MI and (-) DOE Normal cardiovascular exam Rhythm:regular Rate:Normal     Neuro/Psych PSYCHIATRIC DISORDERS Depression Schizophrenia negative neurological ROS     GI/Hepatic negative GI ROS, Neg liver ROS,   Endo/Other  diabetes, Type 2  Renal/GU Renal disease  negative genitourinary   Musculoskeletal   Abdominal   Peds  Hematology negative hematology ROS (+)   Anesthesia Other Findings Past Medical History:   Hypertension                                                 Schizophrenia (HCC)                                          Hyperlipemia                                                 Depression                                                   Diabetes (HCC)                                               Liver disease                                                Diabetes mellitus, type II (HCC)                             Heart disease                                                Cirrhosis of liver with ascites (HCC)                       Past Surgical History:   NO PAST SURGERIES  ESOPHAGOGASTRODUODENOSCOPY (EGD) WITH PROPOFOL  N/A 08/03/2015     Comment:Procedure: ESOPHAGOGASTRODUODENOSCOPY (EGD)               WITH PROPOFOL;  Surgeon: Elnita MaxwellMatthew Gordon Rein,               MD;  [facility];  Service:               Endoscopy;  Laterality: N/A;  BMI    Body Mass Index   34.99 kg/m 2      Reproductive/Obstetrics negative OB ROS                             Anesthesia Physical Anesthesia Plan  ASA: III  Anesthesia Plan: General   Post-op Pain Management:    Induction:   Airway Management Planned:   Additional Equipment:   Intra-op Plan:   Post-operative Plan:   Informed Consent: I have reviewed the patients History and Physical, chart, labs and discussed the procedure including the risks, benefits and alternatives for the proposed anesthesia with the patient or authorized representative who has indicated his/her understanding and acceptance.   Dental Advisory Given  Plan Discussed with: Anesthesiologist, CRNA and Surgeon  Anesthesia Plan Comments:         Anesthesia Quick Evaluation

## 2015-09-01 ENCOUNTER — Encounter: Payer: Self-pay | Admitting: Gastroenterology

## 2015-09-05 ENCOUNTER — Ambulatory Visit: Payer: Medicare Other | Admitting: Gastroenterology

## 2015-09-06 NOTE — Progress Notes (Signed)
[facility] MD Progress Note  09/06/2015 5:09 PM Howard [name]  MRN:  [id number] Subjective:  Follow-up for this 34 year old man with schizophrenia who also has developed a significant alcohol abuse problem. Patient tells me that he is doing his best to try and maintain sobriety but has had some relapses. His voices tend to get worse when he has been drinking. His mood is not overly depressed and he does not have suicidal thoughts and he is not expressing paranoia. He is tolerating medicine well and appears to have an understanding of the dangers of his drinking. Principal Problem: _0 @ Diagnosis:   Patient Active Problem List   Diagnosis Date Noted  . Acute renal failure (ARF) ([place]) [N17.9] 07/30/2015  . 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 Hypoxia [R09.02] 07/04/2015  . Ascites [R18.8] 07/04/2015  . Elevated transaminase level [R74.0] 07/04/2015  . DOE (dyspnea on exertion) [R06.09] 07/04/2015  . Alcohol abuse [F10.10] 05/31/2015  . Hypertension [I10] 05/29/2015  . Paranoid schizophrenia ([place]) [F20.0] 03/20/2015   Total Time spent with patient: 25 minutes  Past Psychiatric History: Patient has a long history of schizophrenia. Has had hospitalizations before. Has responded well to medication. The existence of his alcohol abuse is only become evident fairly recently.  Past Medical History:  Past Medical History  Diagnosis Date  . Hypertension   . Schizophrenia ([place])   . Hyperlipemia   . Depression   . Diabetes ([place])   . Liver disease   . Diabetes mellitus, type II ([place])   . Heart disease   . Cirrhosis of liver with ascites [facility])     Past Surgical History  Procedure Laterality Date  . No past surgeries    . Esophagogastroduodenoscopy (egd) with propofol N/A 08/03/2015    Procedure: ESOPHAGOGASTRODUODENOSCOPY (EGD) WITH PROPOFOL;  Surgeon: Josefine Class, MD;  Location: [facility] ENDOSCOPY;  Service: Endoscopy;  Laterality: N/A;  . Esophagogastroduodenoscopy (egd) with  propofol N/A 08/31/2015    Procedure: ESOPHAGOGASTRODUODENOSCOPY (EGD) WITH PROPOFOL;  Surgeon: Josefine Class, MD;  Location: [facility] ENDOSCOPY;  Service: Endoscopy;  Laterality: N/A;   Family History:  Family History  Problem Relation Age of Onset  . Heart disease Mother   . Hypertension Mother   . Hyperlipidemia Mother   . Stroke Father   . Heart attack Father   . Hypertension Father   . Heart disease Brother    Family Psychiatric  History: No family history of mental health problems Social History:  History  Alcohol Use No    Comment: states quit drinking 1 month ago.     History  Drug Use No    Social History   Social History  . Marital Status: Single    Spouse Name: N/A  . Number of Children: N/A  . Years of Education: N/A   Social History Main Topics  . Smoking status: Never Smoker   . Smokeless tobacco: Never Used  . Alcohol Use: No     Comment: states quit drinking 1 month ago.  . Drug Use: No  . Sexual Activity: No   Other Topics Concern  . None   Social History Narrative   ** Merged History Encounter **       Additional Social History:                         Sleep: Good  Appetite:  Good  Current Medications: Current Outpatient Prescriptions  Medication Sig Dispense Refill  . albuterol (PROVENTIL HFA;VENTOLIN HFA)  108 (90 BASE) MCG/ACT inhaler Inhale 2 puffs into the lungs every 6 (six) hours as needed for wheezing or shortness of breath.     . diphenhydrAMINE (BENADRYL) 25 mg capsule Take 50 mg by mouth at bedtime as needed for sleep.    . furosemide (LASIX) 20 MG tablet Take 1 tablet (20 mg total) by mouth 2 (two) times daily. 60 tablet 0  . metFORMIN (GLUCOPHAGE) 500 MG tablet Take 1 tablet (500 mg total) by mouth 2 (two) times daily with a meal. 60 tablet 3  . OLANZapine (ZYPREXA) 15 MG tablet Take 15 mg by mouth at bedtime.    Marland Kitchen omeprazole (PRILOSEC) 40 MG capsule Take 40 mg by mouth daily.     . simvastatin (ZOCOR) 40 MG tablet  Take 40 mg by mouth at bedtime.     Marland Kitchen aspirin EC 81 MG tablet Take by mouth.    . Blood Glucose Monitoring Suppl (ONETOUCH VERIO) W/DEVICE KIT CHECK BLOOD SUGAR FIRST THING IN THE MORNING E11.65  0  . cefUROXime (CEFTIN) 250 MG tablet TAKE 1 TABLET BY MOUTH TWICE A DAY UNTIL FINISHED WITH A MEAL  0  . citalopram (CELEXA) 20 MG tablet Take 20 mg by mouth daily.    . clobetasol cream (TEMOVATE) 7.10 % Apply 1 application topically as needed (for skin irritation).    Marland Kitchen lactulose (CHRONULAC) 10 GM/15ML solution Take 45 mLs (30 g total) by mouth every 6 (six) hours. 1892 mL 0  . losartan-hydrochlorothiazide (HYZAAR) 100-25 MG tablet     . metoprolol succinate (TOPROL-XL) 100 MG 24 hr tablet     . Multiple Vitamin (MULTIVITAMIN WITH MINERALS) TABS tablet Take 1 tablet by mouth daily. 30 tablet 0  . nadolol (CORGARD) 40 MG tablet Take 2 tablets (80 mg total) by mouth daily. 60 tablet 0  . ondansetron (ZOFRAN) 8 MG tablet TAKE 1 TABLET ORALLY EVERY 8 HOURS AS NEEDED FOR NAUSEA  0  . ONETOUCH DELICA LANCETS 62I MISC USE TO CHECK DAILY MORNING BLOOD SUGAR  3  . ONETOUCH VERIO test strip CHECK DAILY MORNING BLOOD SUGER E11.65  3  . rifaximin (XIFAXAN) 550 MG TABS tablet Take 1 tablet (550 mg total) by mouth 2 (two) times daily. 60 tablet 0  . spironolactone (ALDACTONE) 100 MG tablet Take 100 mg by mouth daily.  0   No current facility-administered medications for this visit.    Lab Results: No results found for this or any previous visit (from the past 48 hour(s)).  Physical Findings: AIMS:  , ,  ,  ,    CIWA:    COWS:     Musculoskeletal: Strength & Muscle Tone: within normal limits Gait & Station: normal Patient leans: N/A  Psychiatric Specialty Exam: Review of Systems  Constitutional: Negative.   HENT: Negative.   Eyes: Negative.   Respiratory: Negative.   Cardiovascular: Negative.   Gastrointestinal: Negative.   Musculoskeletal: Negative.   Skin: Negative.   Neurological: Negative.    Psychiatric/Behavioral: Positive for hallucinations and substance abuse. Negative for depression, suicidal ideas and memory loss. The patient is not nervous/anxious and does not have insomnia.     Blood pressure 110/72, pulse 75, temperature 98.1 F (36.7 C), temperature source Tympanic, height _0  (1.905 m), weight 309 lb 9.6 oz (140.434 kg), SpO2 90 %.Body mass index is 38.7 kg/(m^2).  General Appearance: Casual  Eye Contact::  Good  Speech:  Clear and Coherent  Volume:  Normal  Mood:  Euthymic  Affect:  Constricted  Thought Process:  Goal Directed  Orientation:  Full (Time, Place, and Person)  Thought Content:  Hallucinations: Auditory  Suicidal Thoughts:  No  Homicidal Thoughts:  No  Memory:  Immediate;   Poor Recent;   Fair Remote;   Fair  Judgement:  Intact  Insight:  Fair  Psychomotor Activity:  Normal  Concentration:  Fair  Recall:  [employer] of Knowledge:Fair  Language: Fair  Akathisia:  Negative  Handed:  Right  AIMS (if indicated):     Assets:  Desire for Improvement Financial Resources/Insurance Housing Resilience Social Support  ADL's:  Intact  Cognition: WNL  Sleep:      Treatment Plan Summary: Medication management and Plan I reviewed with the patient at some length the dangers he is getting into. He has Artie shown signs of cirrhosis. He is clearly putting himself at high risk to continue to drink. He has been involved in good outpatient treatment in the past and I strongly encouraged him to call back over to [place] and try to get involved with the intensive outpatient program or at least some kind of substance abuse treatment. Patient is minimizing the amount which she is drinking. We are continuing his Zyprexa which she responds to well. No side effects. Supportive counseling around that and review of medication. Follow-up 3 months.  John [name] 09/06/2015, 5:09 PM

## 2015-09-13 ENCOUNTER — Other Ambulatory Visit: Payer: Self-pay | Admitting: Psychiatry

## 2015-09-27 ENCOUNTER — Other Ambulatory Visit: Payer: Self-pay

## 2015-09-27 NOTE — Telephone Encounter (Signed)
[facility] for citalopram hbr 20 mg take 1 tablet by mouth every day.  pt was last seen on 07-12-15 next appt  10-09-15

## 2015-09-28 MED ORDER — CITALOPRAM HYDROBROMIDE 20 MG PO TABS: 20.0000 mg | ORAL_TABLET | Freq: Every day | ORAL | Status: DC

## 2015-10-03 ENCOUNTER — Other Ambulatory Visit: Payer: Self-pay | Admitting: Psychiatry

## 2015-10-04 ENCOUNTER — Encounter: Payer: Self-pay | Admitting: *Deleted

## 2015-10-05 ENCOUNTER — Encounter
Admission: RE | Disposition: A | Discharge status: Home / Self Care | Payer: Self-pay | Source: Ambulatory Visit | Attending: Gastroenterology

## 2015-10-05 ENCOUNTER — Ambulatory Visit
Admission: RE | Admit: 2015-10-05 | Discharge: 2015-10-05 | Disposition: A | Discharge status: Home / Self Care | Payer: Medicare Other | Source: Ambulatory Visit | Attending: Gastroenterology | Admitting: Gastroenterology

## 2015-10-05 ENCOUNTER — Ambulatory Visit: Payer: Medicare Other | Admitting: Certified Registered Nurse Anesthetist

## 2015-10-05 ENCOUNTER — Encounter: Payer: Self-pay | Admitting: *Deleted

## 2015-10-05 DIAGNOSIS — Z7984 Long term (current) use of oral hypoglycemic drugs: Secondary | ICD-10-CM | POA: Insufficient documentation

## 2015-10-05 DIAGNOSIS — I10 Essential (primary) hypertension: Secondary | ICD-10-CM | POA: Diagnosis not present

## 2015-10-05 DIAGNOSIS — F329 Major depressive disorder, single episode, unspecified: Secondary | ICD-10-CM | POA: Diagnosis not present

## 2015-10-05 DIAGNOSIS — Z7982 Long term (current) use of aspirin: Secondary | ICD-10-CM | POA: Insufficient documentation

## 2015-10-05 DIAGNOSIS — F209 Schizophrenia, unspecified: Secondary | ICD-10-CM | POA: Insufficient documentation

## 2015-10-05 DIAGNOSIS — K766 Portal hypertension: Secondary | ICD-10-CM | POA: Insufficient documentation

## 2015-10-05 DIAGNOSIS — E785 Hyperlipidemia, unspecified: Secondary | ICD-10-CM | POA: Diagnosis not present

## 2015-10-05 DIAGNOSIS — E119 Type 2 diabetes mellitus without complications: Secondary | ICD-10-CM | POA: Diagnosis not present

## 2015-10-05 DIAGNOSIS — I519 Heart disease, unspecified: Secondary | ICD-10-CM | POA: Insufficient documentation

## 2015-10-05 DIAGNOSIS — Z79899 Other long term (current) drug therapy: Secondary | ICD-10-CM | POA: Diagnosis not present

## 2015-10-05 DIAGNOSIS — K7460 Unspecified cirrhosis of liver: Secondary | ICD-10-CM | POA: Insufficient documentation

## 2015-10-05 DIAGNOSIS — K3189 Other diseases of stomach and duodenum: Secondary | ICD-10-CM | POA: Insufficient documentation

## 2015-10-05 DIAGNOSIS — I8500 Esophageal varices without bleeding: Secondary | ICD-10-CM | POA: Diagnosis present

## 2015-10-05 HISTORY — DX: Portal hypertension: K76.6

## 2015-10-05 HISTORY — DX: Other diseases of stomach and duodenum: K31.89

## 2015-10-05 HISTORY — DX: Esophageal varices without bleeding: I85.00

## 2015-10-05 HISTORY — PX: ESOPHAGOGASTRODUODENOSCOPY: SHX5428

## 2015-10-05 SURGERY — EGD (ESOPHAGOGASTRODUODENOSCOPY)
Anesthesia: General

## 2015-10-05 MED ORDER — LIDOCAINE HCL (CARDIAC) 20 MG/ML IV SOLN
INTRAVENOUS | Status: DC | PRN
  Administered 2015-10-05: 60 mg via INTRAVENOUS

## 2015-10-05 MED ORDER — MIDAZOLAM HCL 2 MG/2ML IJ SOLN
INTRAMUSCULAR | Status: DC | PRN
  Administered 2015-10-05: 2 mg via INTRAVENOUS

## 2015-10-05 MED ORDER — PROPOFOL 10 MG/ML IV BOLUS
INTRAVENOUS | Status: DC | PRN
  Administered 2015-10-05 (×2): 50 mg via INTRAVENOUS

## 2015-10-05 MED ORDER — SODIUM CHLORIDE 0.9 % IV SOLN
INTRAVENOUS | Status: DC
  Administered 2015-10-05: 1000 mL via INTRAVENOUS

## 2015-10-05 MED ORDER — SODIUM CHLORIDE 0.9 % IV SOLN
INTRAVENOUS | Status: DC
  Administered 2015-10-05: 15:00:00 via INTRAVENOUS

## 2015-10-05 NOTE — Op Note (Signed)
[facility] Gastroenterology Patient Name: Howard [name] Procedure Date: 10/05/2015 3:01 PM MRN: [id number] Account #: [id number] Date of Birth: 10-03-1980 Admit Type: Outpatient Age: 35 Room: [facility] ENDO ROOM 2 Gender: Male Note Status: Finalized Procedure:         Upper GI endoscopy Indications:       For therapy of esophageal varices Patient Profile:   This is a 35 year old male. Providers:         Rhona Raider. Shelle Iron, MD Medicines:         Propofol per Anesthesia Complications:     No immediate complications. Procedure:         Pre-Anesthesia Assessment:                    - Prior to the procedure, a History and Physical was                     performed, and patient medications, allergies and                     sensitivities were reviewed. The patient's tolerance of                     previous anesthesia was reviewed.                    After obtaining informed consent, the endoscope was passed                     under direct vision. Throughout the procedure, the                     patient's blood pressure, pulse, and oxygen saturations                     were monitored continuously. The Endoscope was introduced                     through the mouth, and advanced to the second part of                     duodenum. The upper GI endoscopy was accomplished without                     difficulty. The patient tolerated the procedure well. Findings:      Grade I varices were found in the lower third of the esophagus.      Mild portal hypertensive gastropathy was found in the gastric body.      The examined duodenum was normal. Impression:        - Grade I esophageal varices, possibly one borderline                     grade 2 varix. Not banded due to being grade 1 and                     tolerating nadolol.                    - Portal hypertensive gastropathy.                    - Normal examined duodenum.                    - No specimens  collected. Recommendation:    -  Observe patient in GI recovery unit.                    - Continue present medications.                    - Repeat the upper endoscopy in 6 months for surveillance.                    - Continue nadolol 40mg  daily, titrate to hear rate of 60.                    - Continue avoiding alcohol.                    - F/u in GI clinic.                    - The findings and recommendations were discussed with the                     patient.                    - The findings and recommendations were discussed with the                     patient's family. Procedure Code(s): --- Professional ---                    [phone], Esophagogastroduodenoscopy, flexible, transoral;                     diagnostic, including collection of specimen(s) by                     brushing or washing, when performed (separate procedure) Diagnosis Code(s): --- Professional ---                    I85.00, Esophageal varices without bleeding                    K76.6, Portal hypertension                    K31.89, Other diseases of stomach and duodenum CPT copyright 2014 American Medical Association. All rights reserved. The codes documented in this report are preliminary and upon coder review may  be revised to meet current compliance requirements. Kathalene FramesMatthew G Rein, MD 10/05/2015 3:26:46 PM This report has been signed electronically. Number of Addenda: 0 Note Initiated On: 10/05/2015 3:01 PM      [facility]

## 2015-10-05 NOTE — Transfer of Care (Signed)
Immediate Anesthesia Transfer of Care Note  Patient: Howard [name]  Procedure(s) Performed: Procedure(s): ESOPHAGOGASTRODUODENOSCOPY (EGD) (N/A)  Patient Location: Endoscopy Unit  Anesthesia Type:General  Level of Consciousness: awake, alert , oriented and patient cooperative  Airway & Oxygen Therapy: Patient Spontanous Breathing and Patient connected to nasal cannula oxygen  Post-op Assessment: Report given to RN, Post -op Vital signs reviewed and stable and Patient moving all extremities X 4  Post vital signs: Reviewed and stable  Last Vitals:  Filed Vitals:   10/05/15 1300  BP: 127/48  Pulse: 78  Temp: 36.8 C  Resp: 20    Complications: No apparent anesthesia complications

## 2015-10-05 NOTE — H&P (Signed)
Primary Care Physician:  Kasandra Knudsen, NP  Pre-Procedure History & Physical: HPI:  Howard [name] is a 35 y.o. male is here for an endoscopy.   Past Medical History  Diagnosis Date  . Hypertension   . Schizophrenia ([place])   . Hyperlipemia   . Depression   . Diabetes ([place])   . Liver disease   . Diabetes mellitus, type II ([place])   . Heart disease   . Cirrhosis of liver with ascites ([place])   . Esophageal varices ([place])   . Portal hypertensive gastropathy     Past Surgical History  Procedure Laterality Date  . No past surgeries    . Esophagogastroduodenoscopy (egd) with propofol N/A 08/03/2015    Procedure: ESOPHAGOGASTRODUODENOSCOPY (EGD) WITH PROPOFOL;  Surgeon: Josefine Class, MD;  Location: [facility] ENDOSCOPY;  Service: Endoscopy;  Laterality: N/A;  . Esophagogastroduodenoscopy (egd) with propofol N/A 08/31/2015    Procedure: ESOPHAGOGASTRODUODENOSCOPY (EGD) WITH PROPOFOL;  Surgeon: Josefine Class, MD;  Location: [facility] ENDOSCOPY;  Service: Endoscopy;  Laterality: N/A;    Prior to Admission medications   Medication Sig Start Date End Date Taking? Authorizing Provider  albuterol (PROVENTIL HFA;VENTOLIN HFA) 108 (90 BASE) MCG/ACT inhaler Inhale 2 puffs into the lungs every 6 (six) hours as needed for wheezing or shortness of breath.    Yes Historical Provider, MD  aspirin EC 81 MG tablet Take by mouth.   Yes Historical Provider, MD  citalopram (CELEXA) 20 MG tablet Take 1 tablet (20 mg total) by mouth daily. 09/28/15  Yes Gonzella Lex, MD  clobetasol cream (TEMOVATE) 9.62 % Apply 1 application topically as needed (for skin irritation).   Yes Historical Provider, MD  diphenhydrAMINE (BENADRYL) 25 mg capsule Take 50 mg by mouth at bedtime as needed for sleep.   Yes Historical Provider, MD  furosemide (LASIX) 20 MG tablet Take 1 tablet (20 mg total) by mouth 2 (two) times daily. 07/08/15  Yes Bettey Costa, MD  losartan-hydrochlorothiazide (HYZAAR) 100-25 MG tablet  08/06/15  Yes  Historical Provider, MD  metFORMIN (GLUCOPHAGE) 500 MG tablet Take 1 tablet (500 mg total) by mouth 2 (two) times daily with a meal. 06/01/15  Yes Jolanta B Pucilowska, MD  metoprolol succinate (TOPROL-XL) 100 MG 24 hr tablet  08/04/15  Yes Historical Provider, MD  Multiple Vitamin (MULTIVITAMIN WITH MINERALS) TABS tablet Take 1 tablet by mouth daily. 08/04/15  Yes Nicholes Mango, MD  nadolol (CORGARD) 40 MG tablet Take 2 tablets (80 mg total) by mouth daily. 08/04/15  Yes Nicholes Mango, MD  OLANZapine (ZYPREXA) 15 MG tablet Take 15 mg by mouth at bedtime.   Yes Historical Provider, MD  omeprazole (PRILOSEC) 40 MG capsule Take 40 mg by mouth daily.    Yes Historical Provider, MD  ondansetron (ZOFRAN) 8 MG tablet TAKE 1 TABLET ORALLY EVERY 8 HOURS AS NEEDED FOR NAUSEA 08/21/15  Yes Historical Provider, MD  rifaximin (XIFAXAN) 550 MG TABS tablet Take 1 tablet (550 mg total) by mouth 2 (two) times daily. 08/04/15  Yes Nicholes Mango, MD  simvastatin (ZOCOR) 40 MG tablet Take 40 mg by mouth at bedtime.    Yes Historical Provider, MD  spironolactone (ALDACTONE) 100 MG tablet Take 100 mg by mouth daily. 07/08/15  Yes Historical Provider, MD  Blood Glucose Monitoring Suppl (ONETOUCH VERIO) W/DEVICE KIT CHECK BLOOD SUGAR FIRST THING IN THE MORNING E11.65 07/11/15   Historical Provider, MD  cefUROXime (CEFTIN) 250 MG tablet Reported on 10/05/2015 07/08/15   Historical Provider, MD  diphenhydrAMINE (BENADRYL)  25 mg capsule TAKE 2 CAPSULES BY MOUTH AT BEDTIME 10/03/15   Gonzella Lex, MD  lactulose (CHRONULAC) 10 GM/15ML solution Take 45 mLs (30 g total) by mouth every 6 (six) hours. 08/04/15   Nicholes Mango, MD  ONETOUCH DELICA LANCETS 09K MISC USE TO CHECK DAILY MORNING BLOOD SUGAR 07/11/15   Historical Provider, MD  [facility] VERIO test strip CHECK DAILY MORNING BLOOD SUGER E11.65 07/11/15   Historical Provider, MD    Allergies as of 10/04/2015  . (No Known Allergies)    Family History  Problem Relation Age of  Onset  . Heart disease Mother   . Hypertension Mother   . Hyperlipidemia Mother   . Stroke Father   . Heart attack Father   . Hypertension Father   . Heart disease Father   . Heart disease Brother     Social History   Social History  . Marital Status: Single    Spouse Name: N/A  . Number of Children: N/A  . Years of Education: N/A   Occupational History  . Not on file.   Social History Main Topics  . Smoking status: Never Smoker   . Smokeless tobacco: Never Used  . Alcohol Use: 2.4 oz/week    4 Cans of beer, 0 Shots of liquor, 0 Standard drinks or equivalent per week     Comment: states quit drinking 1 month ago.  . Drug Use: No  . Sexual Activity: No   Other Topics Concern  . Not on file   Social History Narrative   ** Merged History Encounter **         Physical Exam: BP 127/48 mmHg  Pulse 78  Temp(Src) 98.2 F (36.8 C) (Tympanic)  Resp 20  SpO2 98% General:   Alert,  pleasant and cooperative in NAD Head:  Normocephalic and atraumatic. Neck:  Supple; no masses or thyromegaly. Lungs:  Clear throughout to auscultation.    Heart:  Regular rate and rhythm. Abdomen:  Soft, nontender and nondistended. Normal bowel sounds, without guarding, and without rebound.   Neurologic:  Alert and  oriented x4;  grossly normal neurologically.  Impression/Plan: Jac A Rann is here for an endoscopy to be performed for therapy of esophageal varices  Risks, benefits, limitations, and alternatives regarding  endoscopy have been reviewed with the patient.  Questions have been answered.  All parties agreeable.   Josefine Class, MD  10/05/2015, 2:59 PM

## 2015-10-05 NOTE — Anesthesia Preprocedure Evaluation (Addendum)
Anesthesia Evaluation  Patient identified by MRN, date of birth, ID band Patient awake    Reviewed: Allergy & Precautions, NPO status , Patient's Chart, lab work & pertinent test results  History of Anesthesia Complications Negative for: history of anesthetic complications  Airway Mallampati: II       Dental   Pulmonary neg pulmonary ROS, shortness of breath, COPD,  COPD inhaler,           Cardiovascular hypertension, Pt. on medications and Pt. on home beta blockers + DOE       Neuro/Psych Depression Schizophrenia negative neurological ROS     GI/Hepatic Neg liver ROS,   Endo/Other  diabetes, Oral Hypoglycemic Agents  Renal/GU negative Renal ROS     Musculoskeletal   Abdominal   Peds  Hematology   Anesthesia Other Findings   Reproductive/Obstetrics                            Anesthesia Physical Anesthesia Plan  ASA: III  Anesthesia Plan: General   Post-op Pain Management:    Induction: Intravenous  Airway Management Planned: Nasal Cannula  Additional Equipment:   Intra-op Plan:   Post-operative Plan:   Informed Consent: I have reviewed the patients History and Physical, chart, labs and discussed the procedure including the risks, benefits and alternatives for the proposed anesthesia with the patient or authorized representative who has indicated his/her understanding and acceptance.     Plan Discussed with:   Anesthesia Plan Comments:         Anesthesia Quick Evaluation

## 2015-10-10 ENCOUNTER — Encounter: Payer: Self-pay | Admitting: Gastroenterology

## 2015-10-11 ENCOUNTER — Ambulatory Visit: Payer: Medicare Other | Admitting: Psychiatry

## 2015-10-11 NOTE — Anesthesia Postprocedure Evaluation (Signed)
Anesthesia Post Note  Patient: Delman A Saltz  Procedure(s) Performed: Procedure(s) (LRB): ESOPHAGOGASTRODUODENOSCOPY (EGD) (N/A)  Patient location during evaluation: PACU Anesthesia Type: General Level of consciousness: awake and alert and oriented Pain management: pain level controlled Vital Signs Assessment: post-procedure vital signs reviewed and stable Respiratory status: spontaneous breathing Cardiovascular status: blood pressure returned to baseline Anesthetic complications: no    Last Vitals:  Filed Vitals:   10/05/15 1543 10/05/15 1603  BP: 150/96 152/99  Pulse: 81 81  Temp:    Resp: 18 24    Last Pain:  Filed Vitals:   10/06/15 1447  PainSc: 0-No pain                 Milynn Quirion

## 2015-10-16 ENCOUNTER — Other Ambulatory Visit: Payer: Self-pay

## 2015-10-16 ENCOUNTER — Encounter: Payer: Self-pay | Admitting: Psychiatry

## 2015-10-16 ENCOUNTER — Emergency Department: Payer: Medicare Other

## 2015-10-16 ENCOUNTER — Observation Stay
Admission: EM | Admit: 2015-10-16 | Discharge: 2015-10-17 | Disposition: A | Discharge status: Home / Self Care | Payer: Medicare Other | Attending: Internal Medicine | Admitting: Internal Medicine

## 2015-10-16 ENCOUNTER — Ambulatory Visit (INDEPENDENT_AMBULATORY_CARE_PROVIDER_SITE_OTHER): Payer: Medicare Other | Admitting: Psychiatry

## 2015-10-16 ENCOUNTER — Encounter: Payer: Self-pay | Admitting: Emergency Medicine

## 2015-10-16 VITALS — BP 118/70 | HR 74 | Temp 97.3°F | Ht 75.0 in | Wt 281.4 lb

## 2015-10-16 DIAGNOSIS — I10 Essential (primary) hypertension: Secondary | ICD-10-CM | POA: Insufficient documentation

## 2015-10-16 DIAGNOSIS — F1010 Alcohol abuse, uncomplicated: Secondary | ICD-10-CM | POA: Diagnosis not present

## 2015-10-16 DIAGNOSIS — K7031 Alcoholic cirrhosis of liver with ascites: Secondary | ICD-10-CM | POA: Diagnosis not present

## 2015-10-16 DIAGNOSIS — K3189 Other diseases of stomach and duodenum: Secondary | ICD-10-CM | POA: Insufficient documentation

## 2015-10-16 DIAGNOSIS — Z7984 Long term (current) use of oral hypoglycemic drugs: Secondary | ICD-10-CM | POA: Diagnosis not present

## 2015-10-16 DIAGNOSIS — N179 Acute kidney failure, unspecified: Secondary | ICD-10-CM | POA: Insufficient documentation

## 2015-10-16 DIAGNOSIS — F200 Paranoid schizophrenia: Secondary | ICD-10-CM | POA: Diagnosis not present

## 2015-10-16 DIAGNOSIS — R0902 Hypoxemia: Secondary | ICD-10-CM | POA: Insufficient documentation

## 2015-10-16 DIAGNOSIS — Z811 Family history of alcohol abuse and dependence: Secondary | ICD-10-CM | POA: Insufficient documentation

## 2015-10-16 DIAGNOSIS — I4581 Long QT syndrome: Secondary | ICD-10-CM | POA: Insufficient documentation

## 2015-10-16 DIAGNOSIS — K7290 Hepatic failure, unspecified without coma: Secondary | ICD-10-CM | POA: Diagnosis present

## 2015-10-16 DIAGNOSIS — K7040 Alcoholic hepatic failure without coma: Secondary | ICD-10-CM | POA: Diagnosis not present

## 2015-10-16 DIAGNOSIS — Z7951 Long term (current) use of inhaled steroids: Secondary | ICD-10-CM | POA: Insufficient documentation

## 2015-10-16 DIAGNOSIS — Z7982 Long term (current) use of aspirin: Secondary | ICD-10-CM | POA: Diagnosis not present

## 2015-10-16 DIAGNOSIS — I8500 Esophageal varices without bleeding: Secondary | ICD-10-CM | POA: Insufficient documentation

## 2015-10-16 DIAGNOSIS — F329 Major depressive disorder, single episode, unspecified: Secondary | ICD-10-CM | POA: Insufficient documentation

## 2015-10-16 DIAGNOSIS — K7682 Hepatic encephalopathy: Secondary | ICD-10-CM | POA: Diagnosis present

## 2015-10-16 DIAGNOSIS — R42 Dizziness and giddiness: Secondary | ICD-10-CM | POA: Diagnosis not present

## 2015-10-16 DIAGNOSIS — E785 Hyperlipidemia, unspecified: Secondary | ICD-10-CM | POA: Insufficient documentation

## 2015-10-16 DIAGNOSIS — K766 Portal hypertension: Secondary | ICD-10-CM | POA: Insufficient documentation

## 2015-10-16 DIAGNOSIS — E119 Type 2 diabetes mellitus without complications: Secondary | ICD-10-CM | POA: Insufficient documentation

## 2015-10-16 DIAGNOSIS — Z823 Family history of stroke: Secondary | ICD-10-CM | POA: Insufficient documentation

## 2015-10-16 DIAGNOSIS — R188 Other ascites: Secondary | ICD-10-CM | POA: Diagnosis present

## 2015-10-16 DIAGNOSIS — F1020 Alcohol dependence, uncomplicated: Secondary | ICD-10-CM | POA: Diagnosis present

## 2015-10-16 DIAGNOSIS — Z8249 Family history of ischemic heart disease and other diseases of the circulatory system: Secondary | ICD-10-CM | POA: Insufficient documentation

## 2015-10-16 DIAGNOSIS — K921 Melena: Secondary | ICD-10-CM | POA: Diagnosis not present

## 2015-10-16 DIAGNOSIS — R4182 Altered mental status, unspecified: Secondary | ICD-10-CM | POA: Diagnosis not present

## 2015-10-16 DIAGNOSIS — R740 Nonspecific elevation of levels of transaminase and lactic acid dehydrogenase [LDH]: Secondary | ICD-10-CM | POA: Diagnosis not present

## 2015-10-16 DIAGNOSIS — Z79899 Other long term (current) drug therapy: Secondary | ICD-10-CM | POA: Diagnosis not present

## 2015-10-16 HISTORY — DX: Alcoholic cirrhosis of liver with ascites: K70.31

## 2015-10-16 LAB — URINALYSIS COMPLETE WITH MICROSCOPIC (ARMC ONLY)
BILIRUBIN URINE: NEGATIVE
GLUCOSE, UA: NEGATIVE mg/dL
LEUKOCYTES UA: NEGATIVE
Nitrite: NEGATIVE
Protein, ur: 30 mg/dL — AB
SPECIFIC GRAVITY, URINE: 1.028 (ref 1.005–1.030)
pH: 5 (ref 5.0–8.0)

## 2015-10-16 LAB — COMPREHENSIVE METABOLIC PANEL
ALBUMIN: 3.8 g/dL (ref 3.5–5.0)
ALT: 40 U/L (ref 17–63)
ANION GAP: 7 (ref 5–15)
AST: 52 U/L — ABNORMAL HIGH (ref 15–41)
Alkaline Phosphatase: 92 U/L (ref 38–126)
BUN: 28 mg/dL — ABNORMAL HIGH (ref 6–20)
CHLORIDE: 111 mmol/L (ref 101–111)
CO2: 22 mmol/L (ref 22–32)
Calcium: 9.3 mg/dL (ref 8.9–10.3)
Creatinine, Ser: 1.12 mg/dL (ref 0.61–1.24)
GFR calc non Af Amer: >60 mL/min (ref >60)
Glucose, Bld: 114 mg/dL — ABNORMAL HIGH (ref 65–99)
Potassium: 3.6 mmol/L (ref 3.5–5.1)
SODIUM: 140 mmol/L (ref 135–145)
Total Bilirubin: 1.7 mg/dL — ABNORMAL HIGH (ref 0.3–1.2)
Total Protein: 7.8 g/dL (ref 6.5–8.1)

## 2015-10-16 LAB — TROPONIN I: TROPONIN I: 0.03 ng/mL (ref <0.031)

## 2015-10-16 LAB — CBC
HCT: 35.1 % — ABNORMAL LOW (ref 40.0–52.0)
HEMOGLOBIN: 11.7 g/dL — AB (ref 13.0–18.0)
MCH: 31.7 pg (ref 26.0–34.0)
MCHC: 33.4 g/dL (ref 32.0–36.0)
MCV: 94.9 fL (ref 80.0–100.0)
Platelets: 134 10*3/uL — ABNORMAL LOW (ref 150–440)
RBC: 3.69 MIL/uL — AB (ref 4.40–5.90)
RDW: 15.8 % — ABNORMAL HIGH (ref 11.5–14.5)
WBC: 7.7 10*3/uL (ref 3.8–10.6)

## 2015-10-16 LAB — AMMONIA: AMMONIA: 265 umol/L — AB (ref 9–35)

## 2015-10-16 LAB — LIPASE, BLOOD: Lipase: 33 U/L (ref 11–51)

## 2015-10-16 LAB — MAGNESIUM: MAGNESIUM: 1.8 mg/dL (ref 1.7–2.4)

## 2015-10-16 LAB — APTT: aPTT: 31 seconds (ref 24–36)

## 2015-10-16 LAB — PROTIME-INR
INR: 1.27
Prothrombin Time: 16 seconds — ABNORMAL HIGH (ref 11.4–15.0)

## 2015-10-16 MED ORDER — OLANZAPINE 7.5 MG PO TABS
15.0000 mg | ORAL_TABLET | Freq: Every day | ORAL | Status: DC
  Administered 2015-10-17: 15 mg via ORAL
  Filled 2015-10-16: qty 2

## 2015-10-16 MED ORDER — LACTULOSE 10 GM/15ML PO SOLN
30.0000 g | Freq: Four times a day (QID) | ORAL | Status: DC
  Administered 2015-10-17 (×3): 30 g via ORAL
  Filled 2015-10-16 (×3): qty 60

## 2015-10-16 MED ORDER — PANTOPRAZOLE SODIUM 40 MG PO TBEC
40.0000 mg | DELAYED_RELEASE_TABLET | Freq: Every day | ORAL | Status: DC
  Administered 2015-10-17: 40 mg via ORAL
  Filled 2015-10-16: qty 1

## 2015-10-16 MED ORDER — LORAZEPAM 2 MG/ML IJ SOLN: 1.0000 mg | Freq: Four times a day (QID) | INTRAMUSCULAR | Status: DC | PRN

## 2015-10-16 MED ORDER — ONDANSETRON HCL 4 MG PO TABS: 4.0000 mg | ORAL_TABLET | Freq: Four times a day (QID) | ORAL | Status: DC | PRN

## 2015-10-16 MED ORDER — CITALOPRAM HYDROBROMIDE 20 MG PO TABS
20.0000 mg | ORAL_TABLET | Freq: Every day | ORAL | Status: DC
  Administered 2015-10-17: 20 mg via ORAL
  Filled 2015-10-16: qty 1

## 2015-10-16 MED ORDER — ONDANSETRON HCL 4 MG/2ML IJ SOLN: 4.0000 mg | Freq: Four times a day (QID) | INTRAMUSCULAR | Status: DC | PRN

## 2015-10-16 MED ORDER — ENOXAPARIN SODIUM 40 MG/0.4ML ~~LOC~~ SOLN: 40.0000 mg | SUBCUTANEOUS | Status: DC

## 2015-10-16 MED ORDER — ADULT MULTIVITAMIN W/MINERALS CH
1.0000 | ORAL_TABLET | Freq: Every day | ORAL | Status: DC
  Administered 2015-10-17: 1 via ORAL
  Filled 2015-10-16: qty 1

## 2015-10-16 MED ORDER — NADOLOL 20 MG PO TABS
80.0000 mg | ORAL_TABLET | Freq: Every day | ORAL | Status: DC
  Administered 2015-10-17: 80 mg via ORAL
  Filled 2015-10-16: qty 4

## 2015-10-16 MED ORDER — LACTULOSE 10 GM/15ML PO SOLN
30.0000 g | Freq: Once | ORAL | Status: AC
  Administered 2015-10-16: 30 g via ORAL
  Filled 2015-10-16: qty 60

## 2015-10-16 MED ORDER — OLANZAPINE 15 MG PO TABS: 15.0000 mg | ORAL_TABLET | Freq: Every day | ORAL | Status: DC

## 2015-10-16 MED ORDER — VITAMIN B-1 100 MG PO TABS
100.0000 mg | ORAL_TABLET | Freq: Every day | ORAL | Status: DC
  Administered 2015-10-17: 100 mg via ORAL
  Filled 2015-10-16: qty 1

## 2015-10-16 MED ORDER — LORAZEPAM 2 MG PO TABS
0.0000 mg | ORAL_TABLET | Freq: Two times a day (BID) | ORAL | Status: DC
Start: 2015-10-19 — End: 2015-10-17

## 2015-10-16 MED ORDER — LOSARTAN POTASSIUM 50 MG PO TABS
100.0000 mg | ORAL_TABLET | Freq: Every day | ORAL | Status: DC
  Administered 2015-10-17: 100 mg via ORAL
  Filled 2015-10-16: qty 2

## 2015-10-16 MED ORDER — CETYLPYRIDINIUM CHLORIDE 0.05 % MT LIQD
7.0000 mL | Freq: Two times a day (BID) | OROMUCOSAL | Status: DC
  Administered 2015-10-17 (×2): 7 mL via OROMUCOSAL

## 2015-10-16 MED ORDER — THIAMINE HCL 100 MG/ML IJ SOLN: 100.0000 mg | Freq: Every day | INTRAMUSCULAR | Status: DC

## 2015-10-16 MED ORDER — METFORMIN HCL 500 MG PO TABS
500.0000 mg | ORAL_TABLET | Freq: Two times a day (BID) | ORAL | Status: DC
  Administered 2015-10-17: 500 mg via ORAL
  Filled 2015-10-16: qty 1

## 2015-10-16 MED ORDER — CITALOPRAM HYDROBROMIDE 20 MG PO TABS: 20.0000 mg | ORAL_TABLET | Freq: Every day | ORAL | Status: DC

## 2015-10-16 MED ORDER — LORAZEPAM 2 MG PO TABS: 0.0000 mg | ORAL_TABLET | Freq: Four times a day (QID) | ORAL | Status: DC

## 2015-10-16 MED ORDER — RIFAXIMIN 550 MG PO TABS
550.0000 mg | ORAL_TABLET | Freq: Two times a day (BID) | ORAL | Status: DC
  Administered 2015-10-17 (×2): 550 mg via ORAL
  Filled 2015-10-16 (×2): qty 1

## 2015-10-16 MED ORDER — LORAZEPAM 1 MG PO TABS: 1.0000 mg | ORAL_TABLET | Freq: Four times a day (QID) | ORAL | Status: DC | PRN

## 2015-10-16 MED ORDER — FOLIC ACID 1 MG PO TABS
1.0000 mg | ORAL_TABLET | Freq: Every day | ORAL | Status: DC
  Administered 2015-10-17: 1 mg via ORAL
  Filled 2015-10-16: qty 1

## 2015-10-16 MED ORDER — ASPIRIN EC 81 MG PO TBEC
81.0000 mg | DELAYED_RELEASE_TABLET | Freq: Every day | ORAL | Status: DC
  Administered 2015-10-17: 81 mg via ORAL
  Filled 2015-10-16: qty 1

## 2015-10-16 NOTE — H&P (Signed)
[facility] [place] at [facility]   PATIENT NAME: Howard [name]    MR#:  [id number]  DATE OF BIRTH:  1981-08-06  DATE OF ADMISSION:  10/16/2015  PRIMARY CARE PHYSICIAN: Imelda Pillow, NP   REQUESTING/REFERRING PHYSICIAN: Inocencio Homes, MD  CHIEF COMPLAINT:   Chief Complaint  Patient presents with  . Altered Mental Status    HISTORY OF PRESENT ILLNESS:  Howard [name]  is a 35 y.o. male who presents with acute encephalopathy. She has multiple medical conditions, but most notably has cirrhosis with ascites. He came in and ED for evaluation because his mother, with whom he lives and who also is in the ED for writing history today, states that he has been confused today, and has been "spacing out" a lot. In the ED he was found to have elevated ammonia level. Patient states that he was prescribed lactulose, but stopped taking after a while because it was causing very frequent diarrhea. Patient and his mother were not totally clear about the importance of this medication, or its purpose. Patient states that he did have some blood in his stool 3-4 days ago. He states it was just one time, that it was red blood, and that it was only a small amount mixed with stool. He denies any abdominal pain, nausea or vomiting. Denies any other infectious symptoms recently. Hospitalists were called for admission for hepatic encephalopathy.  PAST MEDICAL HISTORY:   Past Medical History  Diagnosis Date  . Hypertension   . Schizophrenia (HCC)   . Hyperlipemia   . Depression   . Diabetes (HCC)   . Liver disease   . Diabetes mellitus, type II (HCC)   . Heart disease   . Alcoholic cirrhosis of liver with ascites (HCC)   . Esophageal varices (HCC)   . Portal hypertensive gastropathy     PAST SURGICAL HISTORY:   Past Surgical History  Procedure Laterality Date  . No past surgeries    . Esophagogastroduodenoscopy (egd) with propofol N/A 08/03/2015    Procedure:  ESOPHAGOGASTRODUODENOSCOPY (EGD) WITH PROPOFOL;  Surgeon: Elnita Maxwell, MD;  Location: [facility] ENDOSCOPY;  Service: Endoscopy;  Laterality: N/A;  . Esophagogastroduodenoscopy (egd) with propofol N/A 08/31/2015    Procedure: ESOPHAGOGASTRODUODENOSCOPY (EGD) WITH PROPOFOL;  Surgeon: Elnita Maxwell, MD;  Location: [facility] ENDOSCOPY;  Service: Endoscopy;  Laterality: N/A;  . Esophagogastroduodenoscopy N/A 10/05/2015    Procedure: ESOPHAGOGASTRODUODENOSCOPY (EGD);  Surgeon: Elnita Maxwell, MD;  Location: [facility] ENDOSCOPY;  Service: Endoscopy;  Laterality: N/A;    SOCIAL HISTORY:   Social History  Substance Use Topics  . Smoking status: Never Smoker   . Smokeless tobacco: Never Used  . Alcohol Use: 2.4 oz/week    4 Cans of beer, 0 Shots of liquor, 0 Standard drinks or equivalent per week     Comment: last drank 3 days ago    FAMILY HISTORY:   Family History  Problem Relation Age of Onset  . Heart disease Mother   . Hypertension Mother   . Hyperlipidemia Mother   . Stroke Father   . Heart attack Father   . Hypertension Father   . Heart disease Father   . Alcohol abuse Father   . Heart disease Brother     DRUG ALLERGIES:  No Known Allergies  MEDICATIONS AT HOME:   Prior to Admission medications   Medication Sig Start Date End Date Taking? Authorizing Provider  albuterol (PROVENTIL HFA;VENTOLIN HFA) 108 (90 BASE) MCG/ACT inhaler Inhale 2 puffs into the lungs  every 6 (six) hours as needed for wheezing or shortness of breath.    Yes Historical Provider, MD  aspirin EC 81 MG tablet Take 81 mg by mouth daily.    Yes Historical Provider, MD  citalopram (CELEXA) 20 MG tablet Take 1 tablet (20 mg total) by mouth daily. 10/16/15  Yes Audery Amel, MD  diphenhydrAMINE (BENADRYL) 25 mg capsule TAKE 2 CAPSULES BY MOUTH AT BEDTIME 10/03/15  Yes Audery Amel, MD  lactulose (CHRONULAC) 10 GM/15ML solution Take 45 mLs (30 g total) by mouth every 6 (six) hours. 08/04/15  Yes Ramonita Lab,  MD  losartan-hydrochlorothiazide (HYZAAR) 100-25 MG tablet Take 1 tablet by mouth daily.  08/06/15  Yes Historical Provider, MD  metFORMIN (GLUCOPHAGE) 500 MG tablet Take 1 tablet (500 mg total) by mouth 2 (two) times daily with a meal. 06/01/15  Yes Jolanta B Pucilowska, MD  metoprolol succinate (TOPROL-XL) 100 MG 24 hr tablet Take 100 mg by mouth daily.  08/04/15  Yes Historical Provider, MD  Multiple Vitamin (MULTIVITAMIN WITH MINERALS) TABS tablet Take 1 tablet by mouth daily. 08/04/15  Yes Ramonita Lab, MD  nadolol (CORGARD) 40 MG tablet Take 2 tablets (80 mg total) by mouth daily. 08/04/15  Yes Ramonita Lab, MD  OLANZapine (ZYPREXA) 15 MG tablet Take 1 tablet (15 mg total) by mouth at bedtime. 10/16/15  Yes Audery Amel, MD  omeprazole (PRILOSEC) 40 MG capsule Take 40 mg by mouth daily.    Yes Historical Provider, MD  ondansetron (ZOFRAN) 8 MG tablet TAKE 1 TABLET ORALLY EVERY 8 HOURS AS NEEDED FOR NAUSEA 08/21/15  Yes Historical Provider, MD  rifaximin (XIFAXAN) 550 MG TABS tablet Take 1 tablet (550 mg total) by mouth 2 (two) times daily. 08/04/15  Yes Ramonita Lab, MD  simvastatin (ZOCOR) 40 MG tablet Take 40 mg by mouth at bedtime.    Yes Historical Provider, MD    REVIEW OF SYSTEMS:  Review of Systems  Constitutional: Negative for fever, chills, weight loss and malaise/fatigue.  HENT: Negative for ear pain, hearing loss and tinnitus.   Eyes: Negative for blurred vision, double vision, pain and redness.  Respiratory: Negative for cough, hemoptysis and shortness of breath.   Cardiovascular: Negative for chest pain, palpitations, orthopnea and leg swelling.  Gastrointestinal: Positive for blood in stool (small amount, one time, 3-4 days ago without recurrence). Negative for nausea, vomiting, abdominal pain, diarrhea and constipation.  Genitourinary: Negative for dysuria, frequency and hematuria.  Musculoskeletal: Negative for back pain, joint pain and neck pain.  Skin:       No acne, rash,  or lesions  Neurological: Negative for dizziness, tremors, focal weakness and weakness.       Confusion  Endo/Heme/Allergies: Negative for polydipsia. Does not bruise/bleed easily.  Psychiatric/Behavioral: Negative for depression. The patient is not nervous/anxious and does not have insomnia.      VITAL SIGNS:   Filed Vitals:   10/16/15 1827 10/16/15 1832  BP: 120/60 120/60  Pulse: 73 73  Temp: 98.7 F (37.1 C) 98.7 F (37.1 C)  TempSrc: Oral Oral  Resp: 22 18  Height:  (1.905 m)  (1.905 m)  Weight: 127.461 kg (281 lb) 127.461 kg (281 lb)  SpO2: 97% 97%   Wt Readings from Last 3 Encounters:  10/16/15 127.461 kg (281 lb)  10/16/15 127.642 kg (281 lb 6.4 oz)  08/31/15 127.007 kg (280 lb)    PHYSICAL EXAMINATION:  Physical Exam  Vitals reviewed. Constitutional: He is oriented to person,  place, and time. He appears well-developed and well-nourished. No distress.  HENT:  Head: Normocephalic and atraumatic.  Mouth/Throat: Oropharynx is clear and moist.  Eyes: Conjunctivae and EOM are normal. Pupils are equal, round, and reactive to light. No scleral icterus.  Neck: Normal range of motion. Neck supple. No JVD present. No thyromegaly present.  Cardiovascular: Normal rate, regular rhythm and intact distal pulses.  Exam reveals no gallop and no friction rub.   No murmur heard. Respiratory: Effort normal and breath sounds normal. No respiratory distress. He has no wheezes. He has no rales.  GI: Soft. Bowel sounds are normal. He exhibits distension (mild distention). There is no tenderness.  Musculoskeletal: Normal range of motion. He exhibits no edema.  No arthritis, no gout  Lymphadenopathy:    He has no cervical adenopathy.  Neurological: He is alert and oriented to person, place, and time. No cranial nerve deficit.  Responses are slow, but no dysarthria, no aphasia  Skin: Skin is warm and dry. No rash noted. No erythema.  Psychiatric: Judgment and thought content  normal.  Slowed responses, but appropriate    LABORATORY PANEL:   CBC  Recent Labs Lab 10/16/15 1835  WBC 7.7  HGB 11.7*  HCT 35.1*  PLT 134*   ------------------------------------------------------------------------------------------------------------------  Chemistries   Recent Labs Lab 10/16/15 1835 10/16/15 1855  NA 140  --   K 3.6  --   CL 111  --   CO2 22  --   GLUCOSE 114*  --   BUN 28*  --   CREATININE 1.12  --   CALCIUM 9.3  --   MG  --  1.8  AST 52*  --   ALT 40  --   ALKPHOS 92  --   BILITOT 1.7*  --    ------------------------------------------------------------------------------------------------------------------  Cardiac Enzymes  Recent Labs Lab 10/16/15 1855  TROPONINI 0.03   ------------------------------------------------------------------------------------------------------------------  RADIOLOGY:  Dg Chest Portable 1 View  10/16/2015  CLINICAL DATA:  35 year old male with altered mental status, dizziness, liver disease and diabetes. EXAM: PORTABLE CHEST 1 VIEW COMPARISON:  07/30/2015 and prior exams FINDINGS: The cardiomediastinal silhouette is unremarkable. There is no evidence of focal airspace disease, pulmonary edema, suspicious pulmonary nodule/mass, pleural effusion, or pneumothorax. No acute bony abnormalities are identified. IMPRESSION: No active disease. Electronically Signed   By: Harmon Pier M.D.   On: 10/16/2015 20:48    EKG:   Orders placed or performed during the hospital encounter of 10/16/15  . ED EKG  . ED EKG    IMPRESSION AND PLAN:  Principal Problem:   Hepatic encephalopathy (HCC) - ammonia level in the 260s. Patient has not been taking his lactulose. Provided education to evaluate the importance of this medication to the patient and his mother. We will restart this medication here, check an a.m. pneumonia. Active Problems:   Alcohol abuse - CIWA protocol   Paranoid schizophrenia (HCC) - continue home meds    Hypertension - currently stable, continue home meds   Alcoholic cirrhosis of liver with ascites (HCC) - continue home meds   Type 2 diabetes mellitus (HCC) - continue home metformin, glucose was only mildly elevated in the ED, check hemoglobin A1c. Check glucose levels with meals  All the records are reviewed and case discussed with ED provider. Management plans discussed with the patient and/or family.  DVT PROPHYLAXIS: SubQ lovenox  GI PROPHYLAXIS: PPI  ADMISSION STATUS: Observation  CODE STATUS: Full Code Status History    Date Active Date Inactive Code  Status Order ID Comments User Context   07/30/2015  5:35 PM 08/04/2015  8:34 PM Full Code [id number]  Enedina Finner, MD Inpatient   07/05/2015  1:16 AM 07/08/2015  7:22 PM Full Code [id number]  Oralia Manis, MD Inpatient   05/30/2015  7:28 PM 06/01/2015  3:36 PM Full Code [id number]  Audery Amel, MD Inpatient      TOTAL TIME TAKING CARE OF THIS PATIENT: 45 minutes.    Mariel Lukins FIELDING 10/16/2015, 10:55 PM  [facility]  Office  [phone]  CC: Primary care physician; Imelda Pillow, NP

## 2015-10-16 NOTE — ED Notes (Signed)
Bladder scan showed less than 30

## 2015-10-16 NOTE — ED Notes (Signed)
Lab called for add on of mag

## 2015-10-16 NOTE — ED Notes (Signed)
MD at bedside. 

## 2015-10-16 NOTE — ED Notes (Signed)
Patient states he has been drinking beer.  Last drank 3 days ago.

## 2015-10-16 NOTE — ED Provider Notes (Signed)
[facility] Emergency Department Provider Note  ____________________________________________  Time seen: Approximately 9:05 PM  I have reviewed the triage vital signs and the nursing notes.   HISTORY  Chief Complaint Altered Mental Status    HPI Howard [name] is a 35 y.o. male history of hypertension, schizophrenia, hyperlipidemia, diabetes, alcoholic liver cirrhosis with ascites, previous GI bleed secondary to esophageal varices who presents for evaluation of altered mental status today, gradual onset, constant since onset, currently mild to moderate, no modifying factors. According to the patient's mother at bedside, the patient has been acting "funny" today. He was having shaking of his hands, he appeared to be "spacing out", he was stumbling around and appeared unsteady on his feet. No vomiting, diarrhea, fevers, chills, chest pain or difficulty breathing. He did have some blood in his stool several days ago.   Past Medical History  Diagnosis Date  . Hypertension   . Schizophrenia (HCC)   . Hyperlipemia   . Depression   . Diabetes (HCC)   . Liver disease   . Diabetes mellitus, type II (HCC)   . Heart disease   . Cirrhosis of liver with ascites (HCC)   . Esophageal varices (HCC)   . Portal hypertensive gastropathy     Patient Active Problem List   Diagnosis Date Noted  . Acute renal failure (ARF) (HCC) 07/30/2015  . Alcoholic cirrhosis (HCC) 07/19/2015  . Hypoxia 07/04/2015  . Ascites 07/04/2015  . Elevated transaminase level 07/04/2015  . DOE (dyspnea on exertion) 07/04/2015  . Alcohol abuse 05/31/2015  . Hypertension 05/29/2015  . Paranoid schizophrenia (HCC) 03/20/2015    Past Surgical History  Procedure Laterality Date  . No past surgeries    . Esophagogastroduodenoscopy (egd) with propofol N/A 08/03/2015    Procedure: ESOPHAGOGASTRODUODENOSCOPY (EGD) WITH PROPOFOL;  Surgeon: Elnita Maxwell, MD;  Location: [facility] ENDOSCOPY;  Service:  Endoscopy;  Laterality: N/A;  . Esophagogastroduodenoscopy (egd) with propofol N/A 08/31/2015    Procedure: ESOPHAGOGASTRODUODENOSCOPY (EGD) WITH PROPOFOL;  Surgeon: Elnita Maxwell, MD;  Location: [facility] ENDOSCOPY;  Service: Endoscopy;  Laterality: N/A;  . Esophagogastroduodenoscopy N/A 10/05/2015    Procedure: ESOPHAGOGASTRODUODENOSCOPY (EGD);  Surgeon: Elnita Maxwell, MD;  Location: [facility] ENDOSCOPY;  Service: Endoscopy;  Laterality: N/A;    Current Outpatient Rx  Name  Route  Sig  Dispense  Refill  . albuterol (PROVENTIL HFA;VENTOLIN HFA) 108 (90 BASE) MCG/ACT inhaler   Inhalation   Inhale 2 puffs into the lungs every 6 (six) hours as needed for wheezing or shortness of breath.          Marland Kitchen aspirin EC 81 MG tablet   Oral   Take 81 mg by mouth daily.          . citalopram (CELEXA) 20 MG tablet   Oral   Take 1 tablet (20 mg total) by mouth daily.   90 tablet   1   . diphenhydrAMINE (BENADRYL) 25 mg capsule      TAKE 2 CAPSULES BY MOUTH AT BEDTIME   60 capsule   2   . lactulose (CHRONULAC) 10 GM/15ML solution   Oral   Take 45 mLs (30 g total) by mouth every 6 (six) hours.   1892 mL   0   . losartan-hydrochlorothiazide (HYZAAR) 100-25 MG tablet   Oral   Take 1 tablet by mouth daily.          . metFORMIN (GLUCOPHAGE) 500 MG tablet   Oral   Take 1 tablet (500  mg total) by mouth 2 (two) times daily with a meal.   60 tablet   3   . metoprolol succinate (TOPROL-XL) 100 MG 24 hr tablet   Oral   Take 100 mg by mouth daily.          . Multiple Vitamin (MULTIVITAMIN WITH MINERALS) TABS tablet   Oral   Take 1 tablet by mouth daily.   30 tablet   0   . nadolol (CORGARD) 40 MG tablet   Oral   Take 2 tablets (80 mg total) by mouth daily.   60 tablet   0   . OLANZapine (ZYPREXA) 15 MG tablet   Oral   Take 1 tablet (15 mg total) by mouth at bedtime.   90 tablet   1   . omeprazole (PRILOSEC) 40 MG capsule   Oral   Take 40 mg by mouth daily.          .  ondansetron (ZOFRAN) 8 MG tablet      TAKE 1 TABLET ORALLY EVERY 8 HOURS AS NEEDED FOR NAUSEA      0   . rifaximin (XIFAXAN) 550 MG TABS tablet   Oral   Take 1 tablet (550 mg total) by mouth 2 (two) times daily.   60 tablet   0   . simvastatin (ZOCOR) 40 MG tablet   Oral   Take 40 mg by mouth at bedtime.            Allergies Review of patient's allergies indicates no known allergies.  Family History  Problem Relation Age of Onset  . Heart disease Mother   . Hypertension Mother   . Hyperlipidemia Mother   . Stroke Father   . Heart attack Father   . Hypertension Father   . Heart disease Father   . Alcohol abuse Father   . Heart disease Brother     Social History Social History  Substance Use Topics  . Smoking status: Never Smoker   . Smokeless tobacco: Never Used  . Alcohol Use: 2.4 oz/week    4 Cans of beer, 0 Shots of liquor, 0 Standard drinks or equivalent per week     Comment: last drank 3 days ago    Review of Systems Constitutional: No fever/chills Eyes: No visual changes. ENT: No sore throat. Cardiovascular: Denies chest pain. Respiratory: Denies shortness of breath. Gastrointestinal: No abdominal pain.  No nausea, no vomiting.  No diarrhea.  No constipation. Genitourinary: Negative for dysuria. Musculoskeletal: Negative for back pain. Skin: Negative for rash. Neurological: Negative for headaches, focal weakness or numbness.  10-point ROS otherwise negative.  ____________________________________________   PHYSICAL EXAM:  VITAL SIGNS: ED Triage Vitals  Enc Vitals Group     BP 10/16/15 1827 120/60 mmHg     Pulse Rate 10/16/15 1827 73     Resp 10/16/15 1827 22     Temp 10/16/15 1827 98.7 F (37.1 C)     Temp Source 10/16/15 1827 Oral     SpO2 10/16/15 1827 97 %     Weight 10/16/15 1827 281 lb (127.461 kg)     Height 10/16/15 1827  (1.905 m)     Head Cir --      Peak Flow --      Pain Score 10/16/15 1832 0     Pain Loc --       Pain Edu? --      Excl. in GC? --     Constitutional: Alert and oriented. Fatigued  but nontoxic appearing and in no acute distress. Eyes: Conjunctivae are normal. PERRL. EOMI. Head: Atraumatic. Nose: No congestion/rhinnorhea. Mouth/Throat: Mucous membranes are moist.  Oropharynx non-erythematous. Neck: No stridor. Supple without meningismus. Cardiovascular: Normal rate, regular rhythm. Grossly normal heart sounds.  Good peripheral circulation. Respiratory: Normal respiratory effort.  No retractions. Lungs CTAB. Gastrointestinal: Soft and nontender. No distention. No CVA tenderness. Genitourinary: deferred Rectal: brown stool in rectal vault is guaiac positive Musculoskeletal: No lower extremity tenderness nor edema.  No joint effusions. Neurologic:  Normal speech and language. No gross focal neurologic deficits are appreciated. 5 out of 5 in bilateral upper and lower extremities, sensation intact to light touch throughout. +asterixis Skin:  Skin is warm, dry and intact. No rash noted. Psychiatric: Mood and affect are normal. Speech and behavior are normal.  ____________________________________________   LABS (all labs ordered are listed, but only abnormal results are displayed)  Labs Reviewed  COMPREHENSIVE METABOLIC PANEL - Abnormal; Notable for the following:    Glucose, Bld 114 (*)    BUN 28 (*)    AST 52 (*)    Total Bilirubin 1.7 (*)    All other components within normal limits  CBC - Abnormal; Notable for the following:    RBC 3.69 (*)    Hemoglobin 11.7 (*)    HCT 35.1 (*)    RDW 15.8 (*)    Platelets 134 (*)    All other components within normal limits  AMMONIA - Abnormal; Notable for the following:    Ammonia 265 (*)    All other components within normal limits  PROTIME-INR - Abnormal; Notable for the following:    Prothrombin Time 16.0 (*)    All other components within normal limits  LIPASE, BLOOD  APTT  TROPONIN I  URINALYSIS COMPLETEWITH MICROSCOPIC ([facility]  ONLY)  MAGNESIUM   ____________________________________________  EKG  ED ECG REPORT I, Gayla Doss, the attending physician, personally viewed and interpreted this ECG.   Date: 10/16/2015  EKG Time: 20:26  Rate: 74  Rhythm: normal sinus rhythm  Axis: normal  Intervals:none  ST&T Change: No acute ST elevation. Prolonged QTC of 525 ms. T-wave in lead 3.  ____________________________________________  RADIOLOGY  CXR IMPRESSION: No active disease. ____________________________________________   PROCEDURES  Procedure(s) performed: None  Critical Care performed: No  ____________________________________________   INITIAL IMPRESSION / ASSESSMENT AND PLAN / ED COURSE  Pertinent labs & imaging results that were available during my care of the patient were reviewed by me and considered in my medical decision making (see chart for details).  Howard [name] is a 35 y.o. male history of hypertension, schizophrenia, hyperlipidemia, diabetes, alcoholic liver cirrhosis with ascites, previous GI bleed secondary to esophageal varices who presents for evaluation of altered mental status today. On exam, he is nontoxic appearing and in no acute distress. Vital signs stable, his afebrile. His exam is notable for asterixis and I suspect hepatic encephalopathy as the cause of his symptoms today. He has a nonfocal neurological examination. Screening labs and ammonia are pending. Anticipate admission.  ----------------------------------------- 10:02 PM on 10/16/2015 ----------------------------------------- Labs reviewed. Ammonia elevated 265 supporting the diagnosis of hepatic encephalopathy. Lactulose ordered. CBC notable for mild anemia, hemoglobin 11.7. Brown stool in the rectal vault is strongly guaiac positive. CMP notable for mild AST and T bili elevation. Troponin negative. INR 1.27. Case discussed with the hospitalist, Dr. Anne Hahn, for admission at this  time.  ____________________________________________   FINAL CLINICAL IMPRESSION(S) / ED DIAGNOSES  Final diagnoses:  Hepatic  encephalopathy (HCC)      Gayla Doss, MD 10/16/15 2209

## 2015-10-16 NOTE — ED Notes (Signed)
Patient's mother states he has been acting "funny all day".  Describes hands shaking and looking funny-- similar symptoms to when liver problems began.

## 2015-10-16 NOTE — Progress Notes (Signed)
[facility] MD Progress Note  10/16/2015 3:04 PM Howard [name]  MRN:  [id number] Subjective:  Follow-up for 35 year old man with schizophrenia and alcohol abuse. Since our last visit his cirrhosis has developed even further. Patient says he has been making every effort to stay sober but has had a few relapses. The most recent one he drank a half of a beer and became very sick. He tells me now he finally realizes how important it is to stay sober. As far as his psychosis he has very few hallucinations. Tolerates medicines well. Not feeling depressed. Has things that he is enjoying doing with his life. No sign of dangerousness. Principal Problem: _0 @ Diagnosis:   Patient Active Problem List   Diagnosis Date Noted  . Acute renal failure (ARF) ([place]) [N17.9] 07/30/2015  . 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 Hypoxia [R09.02] 07/04/2015  . Ascites [R18.8] 07/04/2015  . Elevated transaminase level [R74.0] 07/04/2015  . DOE (dyspnea on exertion) [R06.09] 07/04/2015  . Alcohol abuse [F10.10] 05/31/2015  . Hypertension [I10] 05/29/2015  . Paranoid schizophrenia ([place]) [F20.0] 03/20/2015   Total Time spent with patient: 30 minutes  Past Psychiatric History: Long history of schizophrenia more recently abuse of alcohol which rapidly progressed to cirrhosis. He has engaged in appropriate substance abuse treatment. He does fairly well on his current antipsychotic medicine especially if he stays off the alcohol  Past Medical History:  Past Medical History  Diagnosis Date  . Hypertension   . Schizophrenia ([place])   . Hyperlipemia   . Depression   . Diabetes ([place])   . Liver disease   . Diabetes mellitus, type II ([place])   . Heart disease   . Cirrhosis of liver with ascites ([place])   . Esophageal varices ([place])   . Portal hypertensive gastropathy     Past Surgical History  Procedure Laterality Date  . No past surgeries    . Esophagogastroduodenoscopy (egd) with propofol N/A 08/03/2015   Procedure: ESOPHAGOGASTRODUODENOSCOPY (EGD) WITH PROPOFOL;  Surgeon: Josefine Class, MD;  Location: [facility] ENDOSCOPY;  Service: Endoscopy;  Laterality: N/A;  . Esophagogastroduodenoscopy (egd) with propofol N/A 08/31/2015    Procedure: ESOPHAGOGASTRODUODENOSCOPY (EGD) WITH PROPOFOL;  Surgeon: Josefine Class, MD;  Location: [facility] ENDOSCOPY;  Service: Endoscopy;  Laterality: N/A;  . Esophagogastroduodenoscopy N/A 10/05/2015    Procedure: ESOPHAGOGASTRODUODENOSCOPY (EGD);  Surgeon: Josefine Class, MD;  Location[facility] ENDOSCOPY;  Service: Endoscopy;  Laterality: N/A;   Family History:  Family History  Problem Relation Age of Onset  . Heart disease Mother   . Hypertension Mother   . Hyperlipidemia Mother   . Stroke Father   . Heart attack Father   . Hypertension Father   . Heart disease Father   . Alcohol abuse Father   . Heart disease Brother    Family Psychiatric  History: Positive for psychosis and some alcohol abuse Social History:  History  Alcohol Use  . 2.4 oz/week  . 4 Cans of beer, 0 Shots of liquor, 0 Standard drinks or equivalent per week    Comment: states quit drinking 1 month ago.     History  Drug Use No    Social History   Social History  . Marital Status: Single    Spouse Name: N/A  . Number of Children: N/A  . Years of Education: N/A   Social History Main Topics  . Smoking status: Never Smoker   . Smokeless tobacco: Never Used  . Alcohol Use: 2.4 oz/week    4  Cans of beer, 0 Shots of liquor, 0 Standard drinks or equivalent per week     Comment: states quit drinking 1 month ago.  . Drug Use: No  . Sexual Activity: No   Other Topics Concern  . None   Social History Narrative   ** Merged History Encounter **       Additional Social History:                         Sleep: Fair  Appetite:  Fair  Current Medications: Current Outpatient Prescriptions  Medication Sig Dispense Refill  . albuterol (PROVENTIL HFA;VENTOLIN HFA) 108  (90 BASE) MCG/ACT inhaler Inhale 2 puffs into the lungs every 6 (six) hours as needed for wheezing or shortness of breath.     Marland Kitchen aspirin EC 81 MG tablet Take by mouth.    . Blood Glucose Monitoring Suppl (ONETOUCH VERIO) W/DEVICE KIT CHECK BLOOD SUGAR FIRST THING IN THE MORNING E11.65  0  . cefUROXime (CEFTIN) 250 MG tablet Reported on 10/05/2015  0  . citalopram (CELEXA) 20 MG tablet Take 1 tablet (20 mg total) by mouth daily. 90 tablet 1  . clobetasol cream (TEMOVATE) 5.72 % Apply 1 application topically as needed (for skin irritation).    . diphenhydrAMINE (BENADRYL) 25 mg capsule Take 50 mg by mouth at bedtime as needed for sleep.    . diphenhydrAMINE (BENADRYL) 25 mg capsule TAKE 2 CAPSULES BY MOUTH AT BEDTIME 60 capsule 2  . lactulose (CHRONULAC) 10 GM/15ML solution Take 45 mLs (30 g total) by mouth every 6 (six) hours. 1892 mL 0  . losartan-hydrochlorothiazide (HYZAAR) 100-25 MG tablet     . metFORMIN (GLUCOPHAGE) 500 MG tablet Take 1 tablet (500 mg total) by mouth 2 (two) times daily with a meal. 60 tablet 3  . metoprolol succinate (TOPROL-XL) 100 MG 24 hr tablet     . Multiple Vitamin (MULTIVITAMIN WITH MINERALS) TABS tablet Take 1 tablet by mouth daily. 30 tablet 0  . nadolol (CORGARD) 40 MG tablet Take 2 tablets (80 mg total) by mouth daily. 60 tablet 0  . OLANZapine (ZYPREXA) 15 MG tablet Take 1 tablet (15 mg total) by mouth at bedtime. 90 tablet 1  . omeprazole (PRILOSEC) 40 MG capsule Take 40 mg by mouth daily.     . ondansetron (ZOFRAN) 8 MG tablet TAKE 1 TABLET ORALLY EVERY 8 HOURS AS NEEDED FOR NAUSEA  0  . ONETOUCH DELICA LANCETS 62M MISC USE TO CHECK DAILY MORNING BLOOD SUGAR  3  . ONETOUCH VERIO test strip CHECK DAILY MORNING BLOOD SUGER E11.65  3  . rifaximin (XIFAXAN) 550 MG TABS tablet Take 1 tablet (550 mg total) by mouth 2 (two) times daily. 60 tablet 0  . simvastatin (ZOCOR) 40 MG tablet Take 40 mg by mouth at bedtime.      No current facility-administered medications  for this visit.    Lab Results: No results found for this or any previous visit (from the past 48 hour(s)).  Physical Findings: AIMS:  , ,  ,  ,    CIWA:    COWS:     Musculoskeletal: Strength & Muscle Tone: within normal limits Gait & Station: normal Patient leans: N/A  Psychiatric Specialty Exam: ROS  Blood pressure 118/70, pulse 74, temperature 97.3 F (36.3 C), temperature source Tympanic, height _0  (1.905 m), weight 281 lb 6.4 oz (127.642 kg), SpO2 96 %.Body mass index is 35.17 kg/(m^2).  General Appearance: Casual  Eye Contact::  Good  Speech:  Clear and Coherent  Volume:  Decreased  Mood:  Euthymic  Affect:  Constricted  Thought Process:  Goal Directed  Orientation:  Full (Time, Place, and Person)  Thought Content:  Negative  Suicidal Thoughts:  No  Homicidal Thoughts:  No  Memory:  Immediate;   Fair Recent;   Fair Remote;   Fair  Judgement:  Intact  Insight:  Present  Psychomotor Activity:  Normal  Concentration:  Fair  Recall:  [employer] of Knowledge:Fair  Language: Fair  Akathisia:  No  Handed:  Right  AIMS (if indicated):     Assets:  Communication Skills Desire for Improvement Housing Resilience Social Support  ADL's:  Intact  Cognition: WNL  Sleep:      Treatment Plan Summary: Medication management and Plan continue Zyprexa 15 mg at night as well as citalopram. Patient is tolerating medicine well. Most recent labs showed just the progression of cirrhosis. From a standpoint of schizophrenia and he was doing well and we will continue medicine. Alcohol abuse he is strongly encouraged to continue AA meetings and getting involved with substance abuse treatment at [facility] again. Patient states he has gotten the alcohol out of his house. We will follow-up in another 3 months. Supportive counseling completed  Alethia Berthold 10/16/2015, 3:04 PM

## 2015-10-17 DIAGNOSIS — K7040 Alcoholic hepatic failure without coma: Secondary | ICD-10-CM | POA: Diagnosis not present

## 2015-10-17 LAB — COMPREHENSIVE METABOLIC PANEL
ALT: 38 U/L (ref 17–63)
ANION GAP: 9 (ref 5–15)
AST: 50 U/L — ABNORMAL HIGH (ref 15–41)
Albumin: 3.5 g/dL (ref 3.5–5.0)
Alkaline Phosphatase: 83 U/L (ref 38–126)
BUN: 31 mg/dL — ABNORMAL HIGH (ref 6–20)
CHLORIDE: 108 mmol/L (ref 101–111)
CO2: 22 mmol/L (ref 22–32)
CREATININE: 1.23 mg/dL (ref 0.61–1.24)
Calcium: 9.3 mg/dL (ref 8.9–10.3)
Glucose, Bld: 94 mg/dL (ref 65–99)
Potassium: 3.4 mmol/L — ABNORMAL LOW (ref 3.5–5.1)
Sodium: 139 mmol/L (ref 135–145)
Total Bilirubin: 1.7 mg/dL — ABNORMAL HIGH (ref 0.3–1.2)
Total Protein: 7.3 g/dL (ref 6.5–8.1)

## 2015-10-17 LAB — GLUCOSE, CAPILLARY
GLUCOSE-CAPILLARY: 91 mg/dL (ref 65–99)
Glucose-Capillary: 72 mg/dL (ref 65–99)

## 2015-10-17 LAB — CBC
HCT: 32.9 % — ABNORMAL LOW (ref 40.0–52.0)
Hemoglobin: 11 g/dL — ABNORMAL LOW (ref 13.0–18.0)
MCH: 31.3 pg (ref 26.0–34.0)
MCHC: 33.4 g/dL (ref 32.0–36.0)
MCV: 93.9 fL (ref 80.0–100.0)
PLATELETS: 119 10*3/uL — AB (ref 150–440)
RBC: 3.51 MIL/uL — AB (ref 4.40–5.90)
RDW: 15.7 % — AB (ref 11.5–14.5)
WBC: 6.1 10*3/uL (ref 3.8–10.6)

## 2015-10-17 LAB — AMMONIA: AMMONIA: 129 umol/L — AB (ref 9–35)

## 2015-10-17 NOTE — Progress Notes (Signed)
10/17/2015  2:14 PM  Howard [name] to be D/C'd Home per MD order.  Discussed prescriptions and follow up appointments with the patient. Prescriptions given to patient, medication list explained in detail. Pt verbalized understanding.    Medication List    TAKE these medications        albuterol 108 (90 Base) MCG/ACT inhaler  Commonly known as:  PROVENTIL HFA;VENTOLIN HFA  Inhale 2 puffs into the lungs every 6 (six) hours as needed for wheezing or shortness of breath.     aspirin EC 81 MG tablet  Take 81 mg by mouth daily.     citalopram 20 MG tablet  Commonly known as:  CELEXA  Take 1 tablet (20 mg total) by mouth daily.     diphenhydrAMINE 25 mg capsule  Commonly known as:  BENADRYL  TAKE 2 CAPSULES BY MOUTH AT BEDTIME     lactulose 10 GM/15ML solution  Commonly known as:  CHRONULAC  Take 45 mLs (30 g total) by mouth every 6 (six) hours.     losartan-hydrochlorothiazide 100-25 MG tablet  Commonly known as:  HYZAAR  Take 1 tablet by mouth daily.     metFORMIN 500 MG tablet  Commonly known as:  GLUCOPHAGE  Take 1 tablet (500 mg total) by mouth 2 (two) times daily with a meal.     metoprolol succinate 100 MG 24 hr tablet  Commonly known as:  TOPROL-XL  Take 100 mg by mouth daily.     multivitamin with minerals Tabs tablet  Take 1 tablet by mouth daily.     nadolol 40 MG tablet  Commonly known as:  CORGARD  Take 2 tablets (80 mg total) by mouth daily.     OLANZapine 15 MG tablet  Commonly known as:  ZYPREXA  Take 1 tablet (15 mg total) by mouth at bedtime.     omeprazole 40 MG capsule  Commonly known as:  PRILOSEC  Take 40 mg by mouth daily.     ondansetron 8 MG tablet  Commonly known as:  ZOFRAN  TAKE 1 TABLET ORALLY EVERY 8 HOURS AS NEEDED FOR NAUSEA     rifaximin 550 MG Tabs tablet  Commonly known as:  XIFAXAN  Take 1 tablet (550 mg total) by mouth 2 (two) times daily.     simvastatin 40 MG tablet  Commonly known as:  ZOCOR  Take 40 mg by mouth at  bedtime.        Filed Vitals:   10/17/15 0532 10/17/15 0932  BP: 122/70 140/66  Pulse: 90   Temp: 98.2 F (36.8 C)   Resp: 16     Skin clean, dry and intact without evidence of skin break down, no evidence of skin tears noted. IV catheter discontinued intact. Site without signs and symptoms of complications. Dressing and pressure applied. Pt denies pain at this time. No complaints noted.  An After Visit Summary was printed and given to the patient. Patient escorted via WC, and D/C home via private auto.  Bradly Chris

## 2015-10-17 NOTE — Care Management (Signed)
Patient admitted from home with Hepatic encephalopathy .  Patient has a history of multiple medication conditions.  Patient lives at home with his mother and brother.  Patient sates that he has a walker and chronic O2 at home.  Patient states that he wears 2 liters continuous and it is provided with [facility].  Patient is to instruct mother to bring portable tank to have at time of discharge.

## 2015-10-17 NOTE — Discharge Summary (Signed)
Howard [name], is a 35 y.o. male  DOB March 08, 1981  MRN [id number].  Admission date:  10/16/2015  Admitting Physician  Oralia Manis, MD  Discharge Date:  10/17/2015   Primary MD  Imelda Pillow, NP  Recommendations for primary care physician for things to follow:   Follow-up with primary doctor in 1 week   Admission Diagnosis  Hepatic encephalopathy (HCC) [K72.90]   Discharge Diagnosis  Hepatic encephalopathy (HCC) [K72.90]    Principal Problem:   Hepatic encephalopathy (HCC) Active Problems:   Paranoid schizophrenia (HCC)   Hypertension   Alcohol abuse   Alcoholic cirrhosis of liver with ascites (HCC)   Type 2 diabetes mellitus (HCC)      Past Medical History  Diagnosis Date  . Hypertension   . Schizophrenia (HCC)   . Hyperlipemia   . Depression   . Diabetes (HCC)   . Liver disease   . Diabetes mellitus, type II (HCC)   . Heart disease   . Alcoholic cirrhosis of liver with ascites (HCC)   . Esophageal varices (HCC)   . Portal hypertensive gastropathy     Past Surgical History  Procedure Laterality Date  . No past surgeries    . Esophagogastroduodenoscopy (egd) with propofol N/A 08/03/2015    Procedure: ESOPHAGOGASTRODUODENOSCOPY (EGD) WITH PROPOFOL;  Surgeon: Elnita Maxwell, MD;  Location: [facility] ENDOSCOPY;  Service: Endoscopy;  Laterality: N/A;  . Esophagogastroduodenoscopy (egd) with propofol N/A 08/31/2015    Procedure: ESOPHAGOGASTRODUODENOSCOPY (EGD) WITH PROPOFOL;  Surgeon: Elnita Maxwell, MD;  Location: [facility] ENDOSCOPY;  Service: Endoscopy;  Laterality: N/A;  . Esophagogastroduodenoscopy N/A 10/05/2015    Procedure: ESOPHAGOGASTRODUODENOSCOPY (EGD);  Surgeon: Elnita Maxwell, MD;  Location: [facility] ENDOSCOPY;  Service: Endoscopy;  Laterality: N/A;       History of present illness and   Hospital Course:     Kindly see H&P for history of present illness and admission details, please review complete Labs, Consult reports and Test reports for all details in brief  HPI  from the history and physical done on the day of admission 35 year old male patient admitted for hepatic encephalopathy with elevated ammonia up to 265. Patient not compliant with lactulose because of diarrhea not taking lactulose regularly for the past 1 week. So he was admitted for hepatic encephalopathy started on lactulose.   Hospital Course  #1 acute  Hepatic encephalopathy secondary to noncompliance with lactulose. Educated about importance of taking medication. Started on lactulose by mouth. Ammonia level improved.From 265-129. Patient is very alert awake oriented, back to his self.. tolerating the diet. Able to go home today.  #2 diabetes mellitus type 2; #3 hyperlipidemia #4 . LIVER cirrhosis with ascites ; alcoholic liver cirrhosis. Patient continued to drink alcohol. Advised strongly to attend AA meetings and also get involved in substance abuse treatment at  [facility] again.  #5.Schizophrenia;continue Zyprexa, Celexa. High risk for readmission,  Discharge Condition: stable.   Follow UP      Discharge Instructions  and  Discharge Medications        Medication List    TAKE these medications        albuterol 108 (90 Base) MCG/ACT inhaler  Commonly known as:  PROVENTIL HFA;VENTOLIN HFA  Inhale 2 puffs into the lungs every 6 (six) hours as needed for wheezing or shortness of breath.     aspirin EC 81 MG tablet  Take 81 mg by mouth daily.     citalopram 20 MG tablet  Commonly known as:  CELEXA  Take 1 tablet (20 mg total) by mouth daily.     diphenhydrAMINE 25 mg capsule  Commonly known as:  BENADRYL  TAKE 2 CAPSULES BY MOUTH AT BEDTIME     lactulose 10 GM/15ML solution  Commonly known as:  CHRONULAC  Take 45 mLs (30 g total) by mouth every 6 (six) hours.      losartan-hydrochlorothiazide 100-25 MG tablet  Commonly known as:  HYZAAR  Take 1 tablet by mouth daily.     metFORMIN 500 MG tablet  Commonly known as:  GLUCOPHAGE  Take 1 tablet (500 mg total) by mouth 2 (two) times daily with a meal.     metoprolol succinate 100 MG 24 hr tablet  Commonly known as:  TOPROL-XL  Take 100 mg by mouth daily.     multivitamin with minerals Tabs tablet  Take 1 tablet by mouth daily.     nadolol 40 MG tablet  Commonly known as:  CORGARD  Take 2 tablets (80 mg total) by mouth daily.     OLANZapine 15 MG tablet  Commonly known as:  ZYPREXA  Take 1 tablet (15 mg total) by mouth at bedtime.     omeprazole 40 MG capsule  Commonly known as:  PRILOSEC  Take 40 mg by mouth daily.     ondansetron 8 MG tablet  Commonly known as:  ZOFRAN  TAKE 1 TABLET ORALLY EVERY 8 HOURS AS NEEDED FOR NAUSEA     rifaximin 550 MG Tabs tablet  Commonly known as:  XIFAXAN  Take 1 tablet (550 mg total) by mouth 2 (two) times daily.     simvastatin 40 MG tablet  Commonly known as:  ZOCOR  Take 40 mg by mouth at bedtime.          Diet and Activity recommendation: See Discharge Instructions above   Consults obtained - psychiatry   Major procedures and Radiology Reports - PLEASE review detailed and final reports for all details, in brief -      Dg Chest Portable 1 View  10/16/2015  CLINICAL DATA:  35 year old male with altered mental status, dizziness, liver disease and diabetes. EXAM: PORTABLE CHEST 1 VIEW COMPARISON:  07/30/2015 and prior exams FINDINGS: The cardiomediastinal silhouette is unremarkable. There is no evidence of focal airspace disease, pulmonary edema, suspicious pulmonary nodule/mass, pleural effusion, or pneumothorax. No acute bony abnormalities are identified. IMPRESSION: No active disease. Electronically Signed   By: Harmon Pier M.D.   On: 10/16/2015 20:48    Micro Results     No results found for this or any previous visit (from the past  240 hour(s)).     Today   Subjective:   Howard [name] today has no headache,no chest abdominal pain,no new weakness tingling or numbness, feels much better wants to go home today.   Objective:   Blood pressure 140/66, pulse 90, temperature 98.2 F (36.8 C), temperature source Oral, resp. rate 16, height  (1.905 m), weight 125.782 kg (277 lb 4.8 oz), SpO2 98 %.   Intake/Output Summary (Last 24 hours) at 10/17/15 1128 Last data filed at 10/17/15 0900  Gross per 24 hour  Intake    240 ml  Output      0 ml  Net    240 ml    Exam Awake Alert, Oriented x 3, No new F.N deficits, Normal affect [place].AT,PERRAL Supple Neck,No JVD, No cervical lymphadenopathy appriciated.  Symmetrical Chest wall movement, Good air movement bilaterally, CTAB RRR,No Gallops,Rubs or new Murmurs, No Parasternal  Heave +ve B.Sounds, Abd Soft, Non tender, No organomegaly appriciated, No rebound -guarding or rigidity. No Cyanosis, Clubbing or edema, No new Rash or bruise  Data Review   CBC w Diff: Lab Results  Component Value Date   WBC 6.1 10/17/2015   WBC 10.1 12/25/2014   HGB 11.0* 10/17/2015   HGB 14.4 12/25/2014   HCT 32.9* 10/17/2015   HCT 44.7 12/25/2014   PLT 119* 10/17/2015   PLT 155 12/25/2014   LYMPHOPCT 13 07/04/2015   MONOPCT 9 07/04/2015   EOSPCT 1 07/04/2015   BASOPCT 1 07/04/2015    CMP: Lab Results  Component Value Date   NA 139 10/17/2015   NA 141 12/25/2014   K 3.4* 10/17/2015   K 3.4* 12/25/2014   CL 108 10/17/2015   CL 100* 12/25/2014   CO2 22 10/17/2015   CO2 30 12/25/2014   BUN 31* 10/17/2015   BUN 15 12/25/2014   CREATININE 1.23 10/17/2015   CREATININE 1.23 12/25/2014   PROT 7.3 10/17/2015   PROT 8.5* 12/25/2014   ALBUMIN 3.5 10/17/2015   ALBUMIN 4.1 12/25/2014   BILITOT 1.7* 10/17/2015   BILITOT 0.9 12/25/2014   ALKPHOS 83 10/17/2015   ALKPHOS 148* 12/25/2014   AST 50* 10/17/2015   AST 146* 12/25/2014   ALT 38 10/17/2015   ALT 107* 12/25/2014   .   Total Time in preparing paper work, data evaluation and todays exam - 35 minutes  Jakobie Henslee M.D on 10/17/2015 at 11:28 AM    Note: This dictation was prepared with Dragon dictation along with smaller phrase technology. Any transcriptional errors that result from this process are unintentional.

## 2015-10-17 NOTE — Care Management Obs Status (Signed)
MEDICARE OBSERVATION STATUS NOTIFICATION   Patient Details  Name: Howard [name] MRN: [id number] Date of Birth: 06-24-1981   Medicare Observation Status Notification Given:  Yes signed and sent to HIM    Chapman Fitch, RN 10/17/2015, 10:30 AM

## 2015-10-28 ENCOUNTER — Emergency Department: Payer: Medicare Other

## 2015-10-28 ENCOUNTER — Inpatient Hospital Stay
Admission: EM | Admit: 2015-10-28 | Discharge: 2015-10-29 | DRG: 443 | Disposition: A | Discharge status: Home / Self Care | Payer: Medicare Other | Attending: Internal Medicine | Admitting: Internal Medicine

## 2015-10-28 DIAGNOSIS — Z7982 Long term (current) use of aspirin: Secondary | ICD-10-CM | POA: Diagnosis not present

## 2015-10-28 DIAGNOSIS — Z811 Family history of alcohol abuse and dependence: Secondary | ICD-10-CM

## 2015-10-28 DIAGNOSIS — Z79899 Other long term (current) drug therapy: Secondary | ICD-10-CM

## 2015-10-28 DIAGNOSIS — K7290 Hepatic failure, unspecified without coma: Secondary | ICD-10-CM | POA: Diagnosis not present

## 2015-10-28 DIAGNOSIS — F1020 Alcohol dependence, uncomplicated: Secondary | ICD-10-CM | POA: Diagnosis present

## 2015-10-28 DIAGNOSIS — Z823 Family history of stroke: Secondary | ICD-10-CM

## 2015-10-28 DIAGNOSIS — E119 Type 2 diabetes mellitus without complications: Secondary | ICD-10-CM | POA: Diagnosis present

## 2015-10-28 DIAGNOSIS — K7460 Unspecified cirrhosis of liver: Secondary | ICD-10-CM | POA: Diagnosis present

## 2015-10-28 DIAGNOSIS — K7682 Hepatic encephalopathy: Secondary | ICD-10-CM

## 2015-10-28 DIAGNOSIS — Z9889 Other specified postprocedural states: Secondary | ICD-10-CM | POA: Diagnosis not present

## 2015-10-28 DIAGNOSIS — Z8249 Family history of ischemic heart disease and other diseases of the circulatory system: Secondary | ICD-10-CM

## 2015-10-28 DIAGNOSIS — I10 Essential (primary) hypertension: Secondary | ICD-10-CM | POA: Diagnosis present

## 2015-10-28 LAB — COMPREHENSIVE METABOLIC PANEL
ALT: 64 U/L — ABNORMAL HIGH (ref 17–63)
AST: 77 U/L — AB (ref 15–41)
Albumin: 3.6 g/dL (ref 3.5–5.0)
Alkaline Phosphatase: 126 U/L (ref 38–126)
Anion gap: 8 (ref 5–15)
BILIRUBIN TOTAL: 1.2 mg/dL (ref 0.3–1.2)
BUN: 23 mg/dL — AB (ref 6–20)
CO2: 22 mmol/L (ref 22–32)
CREATININE: 1.01 mg/dL (ref 0.61–1.24)
Calcium: 8.7 mg/dL — ABNORMAL LOW (ref 8.9–10.3)
Chloride: 108 mmol/L (ref 101–111)
Glucose, Bld: 124 mg/dL — ABNORMAL HIGH (ref 65–99)
POTASSIUM: 3.7 mmol/L (ref 3.5–5.1)
Sodium: 138 mmol/L (ref 135–145)
TOTAL PROTEIN: 7.3 g/dL (ref 6.5–8.1)

## 2015-10-28 LAB — URINALYSIS COMPLETE WITH MICROSCOPIC (ARMC ONLY)
BILIRUBIN URINE: NEGATIVE
Bacteria, UA: NONE SEEN
GLUCOSE, UA: NEGATIVE mg/dL
Ketones, ur: NEGATIVE mg/dL
LEUKOCYTES UA: NEGATIVE
NITRITE: NEGATIVE
PROTEIN: NEGATIVE mg/dL
SPECIFIC GRAVITY, URINE: 1.021 (ref 1.005–1.030)
pH: 5 (ref 5.0–8.0)

## 2015-10-28 LAB — ETHANOL

## 2015-10-28 LAB — CBC
HEMATOCRIT: 31.4 % — AB (ref 40.0–52.0)
Hemoglobin: 10.5 g/dL — ABNORMAL LOW (ref 13.0–18.0)
MCH: 31.7 pg (ref 26.0–34.0)
MCHC: 33.5 g/dL (ref 32.0–36.0)
MCV: 94.6 fL (ref 80.0–100.0)
PLATELETS: 124 10*3/uL — AB (ref 150–440)
RBC: 3.32 MIL/uL — AB (ref 4.40–5.90)
RDW: 15.2 % — AB (ref 11.5–14.5)
WBC: 6.4 10*3/uL (ref 3.8–10.6)

## 2015-10-28 LAB — GLUCOSE, CAPILLARY
GLUCOSE-CAPILLARY: 112 mg/dL — AB (ref 65–99)
Glucose-Capillary: 75 mg/dL (ref 65–99)
Glucose-Capillary: 83 mg/dL (ref 65–99)

## 2015-10-28 LAB — AMMONIA: AMMONIA: 180 umol/L — AB (ref 9–35)

## 2015-10-28 MED ORDER — NADOLOL 80 MG PO TABS
80.0000 mg | ORAL_TABLET | Freq: Every day | ORAL | Status: DC
  Filled 2015-10-28 (×2): qty 1

## 2015-10-28 MED ORDER — LOSARTAN POTASSIUM-HCTZ 100-25 MG PO TABS: 1.0000 | ORAL_TABLET | Freq: Every day | ORAL | Status: DC

## 2015-10-28 MED ORDER — INSULIN ASPART 100 UNIT/ML ~~LOC~~ SOLN: 0.0000 [IU] | Freq: Three times a day (TID) | SUBCUTANEOUS | Status: DC

## 2015-10-28 MED ORDER — SPIRONOLACTONE 25 MG PO TABS
100.0000 mg | ORAL_TABLET | Freq: Every day | ORAL | Status: DC
  Administered 2015-10-29: 100 mg via ORAL
  Filled 2015-10-28: qty 4

## 2015-10-28 MED ORDER — SPIRONOLACTONE 25 MG PO TABS: 100.0000 mg | ORAL_TABLET | Freq: Every day | ORAL | Status: DC

## 2015-10-28 MED ORDER — CITALOPRAM HYDROBROMIDE 20 MG PO TABS
20.0000 mg | ORAL_TABLET | Freq: Every day | ORAL | Status: DC
  Administered 2015-10-29: 20 mg via ORAL
  Filled 2015-10-28: qty 1

## 2015-10-28 MED ORDER — SIMVASTATIN 40 MG PO TABS
40.0000 mg | ORAL_TABLET | Freq: Every day | ORAL | Status: DC
  Administered 2015-10-28: 40 mg via ORAL
  Filled 2015-10-28: qty 1

## 2015-10-28 MED ORDER — LACTULOSE 10 GM/15ML PO SOLN
30.0000 g | Freq: Four times a day (QID) | ORAL | Status: DC
  Administered 2015-10-28 – 2015-10-29 (×3): 30 g via ORAL
  Filled 2015-10-28 (×3): qty 60

## 2015-10-28 MED ORDER — LOSARTAN POTASSIUM 50 MG PO TABS
100.0000 mg | ORAL_TABLET | Freq: Every day | ORAL | Status: DC
  Administered 2015-10-29: 100 mg via ORAL
  Filled 2015-10-28: qty 2

## 2015-10-28 MED ORDER — OLANZAPINE 7.5 MG PO TABS
15.0000 mg | ORAL_TABLET | Freq: Every day | ORAL | Status: DC
  Administered 2015-10-28: 15 mg via ORAL
  Filled 2015-10-28 (×2): qty 2

## 2015-10-28 MED ORDER — DIPHENHYDRAMINE HCL 25 MG PO CAPS
50.0000 mg | ORAL_CAPSULE | Freq: Every day | ORAL | Status: DC
  Administered 2015-10-28: 50 mg via ORAL
  Filled 2015-10-28: qty 2

## 2015-10-28 MED ORDER — HYDROCHLOROTHIAZIDE 25 MG PO TABS
25.0000 mg | ORAL_TABLET | Freq: Every day | ORAL | Status: DC
  Administered 2015-10-29: 25 mg via ORAL
  Filled 2015-10-28: qty 1

## 2015-10-28 MED ORDER — ALBUTEROL SULFATE (2.5 MG/3ML) 0.083% IN NEBU: 2.5000 mg | INHALATION_SOLUTION | Freq: Four times a day (QID) | RESPIRATORY_TRACT | Status: DC | PRN

## 2015-10-28 MED ORDER — ONDANSETRON HCL 4 MG PO TABS: 4.0000 mg | ORAL_TABLET | Freq: Three times a day (TID) | ORAL | Status: DC | PRN

## 2015-10-28 MED ORDER — ALBUTEROL SULFATE HFA 108 (90 BASE) MCG/ACT IN AERS: 2.0000 | INHALATION_SPRAY | Freq: Four times a day (QID) | RESPIRATORY_TRACT | Status: DC | PRN

## 2015-10-28 MED ORDER — ASPIRIN EC 81 MG PO TBEC
81.0000 mg | DELAYED_RELEASE_TABLET | Freq: Every day | ORAL | Status: DC
  Administered 2015-10-29: 81 mg via ORAL
  Filled 2015-10-28: qty 1

## 2015-10-28 MED ORDER — METOPROLOL SUCCINATE ER 100 MG PO TB24
100.0000 mg | ORAL_TABLET | Freq: Every day | ORAL | Status: DC
  Administered 2015-10-29: 100 mg via ORAL
  Filled 2015-10-28: qty 1

## 2015-10-28 MED ORDER — LACTULOSE 10 GM/15ML PO SOLN
30.0000 g | Freq: Once | ORAL | Status: AC
  Administered 2015-10-28: 30 g via ORAL
  Filled 2015-10-28: qty 60

## 2015-10-28 MED ORDER — ADULT MULTIVITAMIN W/MINERALS CH
1.0000 | ORAL_TABLET | Freq: Every day | ORAL | Status: DC
  Administered 2015-10-29: 1 via ORAL
  Filled 2015-10-28: qty 1

## 2015-10-28 MED ORDER — NADOLOL 80 MG PO TABS: 80.0000 mg | ORAL_TABLET | Freq: Every day | ORAL | Status: DC

## 2015-10-28 MED ORDER — RIFAXIMIN 550 MG PO TABS
550.0000 mg | ORAL_TABLET | Freq: Two times a day (BID) | ORAL | Status: DC
  Administered 2015-10-28 – 2015-10-29 (×2): 550 mg via ORAL
  Filled 2015-10-28 (×4): qty 1

## 2015-10-28 NOTE — ED Notes (Signed)
Waiting for pt to arrive to room

## 2015-10-28 NOTE — ED Provider Notes (Signed)
[facility] Emergency Department Provider Note  ____________________________________________  Time seen: Approximately 2:42 PM  I have reviewed the triage vital signs and the nursing notes.   HISTORY  Chief Complaint Altered Mental Status    HPI Howard [name] is a 35 y.o. male with history of  hypertension, schizophrenia, hyperlipidemia, diabetes, alcoholic liver cirrhosis with ascites, previous GI bleed secondary to esophageal varices who presents for evaluation of altered mental status today, gradual onset, constant since onset, currently mild to moderate, no modifying factors. According to the patient's mother at bedside, the patient has been acting "funny" today. He was having shaking of his hands, he appeared to be "spacing out", he was stumbling around and appeared unsteady on his feet. No vomiting, diarrhea, fevers, chills, chest pain or difficulty breathing. He denies any dark or tarry stools over the past few days. He has been drinking alcohol heavily. Of note, he presented similarly at the end of January and was admitted for hepatic encephalopathy. He reports he has been taking his lactulose.   Past Medical History  Diagnosis Date  . Hypertension   . Schizophrenia (HCC)   . Hyperlipemia   . Depression   . Diabetes (HCC)   . Liver disease   . Diabetes mellitus, type II (HCC)   . Heart disease   . Alcoholic cirrhosis of liver with ascites (HCC)   . Esophageal varices (HCC)   . Portal hypertensive gastropathy     Patient Active Problem List   Diagnosis Date Noted  . Hepatic encephalopathy (HCC) 10/16/2015  . Type 2 diabetes mellitus (HCC) 10/16/2015  . Acute renal failure (ARF) (HCC) 07/30/2015  . 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 Hypoxia 07/04/2015  . Elevated transaminase level 07/04/2015  . DOE (dyspnea on exertion) 07/04/2015  . Alcohol abuse 05/31/2015  . Hypertension 05/29/2015  . Paranoid schizophrenia (HCC)  03/20/2015    Past Surgical History  Procedure Laterality Date  . No past surgeries    . Esophagogastroduodenoscopy (egd) with propofol N/A 08/03/2015    Procedure: ESOPHAGOGASTRODUODENOSCOPY (EGD) WITH PROPOFOL;  Surgeon: Elnita Maxwell, MD;  Location: [facility] ENDOSCOPY;  Service: Endoscopy;  Laterality: N/A;  . Esophagogastroduodenoscopy (egd) with propofol N/A 08/31/2015    Procedure: ESOPHAGOGASTRODUODENOSCOPY (EGD) WITH PROPOFOL;  Surgeon: Elnita Maxwell, MD;  Location: [facility] ENDOSCOPY;  Service: Endoscopy;  Laterality: N/A;  . Esophagogastroduodenoscopy N/A 10/05/2015    Procedure: ESOPHAGOGASTRODUODENOSCOPY (EGD);  Surgeon: Elnita Maxwell, MD;  Location: [facility] ENDOSCOPY;  Service: Endoscopy;  Laterality: N/A;    Current Outpatient Rx  Name  Route  Sig  Dispense  Refill  . albuterol (PROVENTIL HFA;VENTOLIN HFA) 108 (90 BASE) MCG/ACT inhaler   Inhalation   Inhale 2 puffs into the lungs every 6 (six) hours as needed for wheezing or shortness of breath.          Marland Kitchen aspirin EC 81 MG tablet   Oral   Take 81 mg by mouth daily.          . citalopram (CELEXA) 20 MG tablet   Oral   Take 1 tablet (20 mg total) by mouth daily.   90 tablet   1   . diphenhydrAMINE (BENADRYL) 25 mg capsule      TAKE 2 CAPSULES BY MOUTH AT BEDTIME   60 capsule   2   . lactulose (CHRONULAC) 10 GM/15ML solution   Oral   Take 45 mLs (30 g total) by mouth every 6 (six) hours.  1892 mL   0   . losartan-hydrochlorothiazide (HYZAAR) 100-25 MG tablet   Oral   Take 1 tablet by mouth daily.          . metFORMIN (GLUCOPHAGE) 500 MG tablet   Oral   Take 1 tablet (500 mg total) by mouth 2 (two) times daily with a meal.   60 tablet   3   . metoprolol succinate (TOPROL-XL) 100 MG 24 hr tablet   Oral   Take 100 mg by mouth daily.          . Multiple Vitamin (MULTIVITAMIN WITH MINERALS) TABS tablet   Oral   Take 1 tablet by mouth daily.   30 tablet   0   . nadolol (CORGARD) 40 MG  tablet   Oral   Take 2 tablets (80 mg total) by mouth daily.   60 tablet   0   . OLANZapine (ZYPREXA) 15 MG tablet   Oral   Take 1 tablet (15 mg total) by mouth at bedtime.   90 tablet   1   . omeprazole (PRILOSEC) 40 MG capsule   Oral   Take 40 mg by mouth every morning.          . ondansetron (ZOFRAN) 8 MG tablet      TAKE 1 TABLET ORALLY EVERY 8 HOURS AS NEEDED FOR NAUSEA      0   . rifaximin (XIFAXAN) 550 MG TABS tablet   Oral   Take 1 tablet (550 mg total) by mouth 2 (two) times daily.   60 tablet   0   . simvastatin (ZOCOR) 40 MG tablet   Oral   Take 40 mg by mouth at bedtime.          Marland Kitchen spironolactone (ALDACTONE) 100 MG tablet   Oral   Take 100 mg by mouth daily.           Allergies Review of patient's allergies indicates no known allergies.  Family History  Problem Relation Age of Onset  . Heart disease Mother   . Hypertension Mother   . Hyperlipidemia Mother   . Stroke Father   . Heart attack Father   . Hypertension Father   . Heart disease Father   . Alcohol abuse Father   . Heart disease Brother     Social History Social History  Substance Use Topics  . Smoking status: Never Smoker   . Smokeless tobacco: Never Used  . Alcohol Use: 2.4 oz/week    4 Cans of beer, 0 Shots of liquor, 0 Standard drinks or equivalent per week     Comment: last drank 3 days ago    Review of Systems Constitutional: No fever/chills Eyes: No visual changes. ENT: No sore throat. Cardiovascular: Denies chest pain. Respiratory: Denies shortness of breath. Gastrointestinal: No abdominal pain.  + nausea, + vomiting.  No diarrhea.  No constipation. Genitourinary: Negative for dysuria. Musculoskeletal: Negative for back pain. Skin: Negative for rash. Neurological: Negative for headaches, focal weakness or numbness.  10-point ROS otherwise negative.  ____________________________________________   PHYSICAL EXAM:  VITAL SIGNS: ED Triage Vitals  Enc  Vitals Group     BP 10/28/15 1405 116/63 mmHg     Pulse Rate 10/28/15 1405 73     Resp 10/28/15 1405 16     Temp 10/28/15 1405 98.2 F (36.8 C)     Temp Source 10/28/15 1405 Oral     SpO2 10/28/15 1405 98 %     Weight 10/28/15  1405 284 lb (128.822 kg)     Height 10/28/15 1405  (1.905 m)     Head Cir --      Peak Flow --      Pain Score 10/28/15 1408 4     Pain Loc --      Pain Edu? --      Excl. in GC? --     Constitutional: Alert and oriented. Fatigued but nontoxic appearing and in no acute distress. Eyes: Conjunctivae are normal. PERRL. EOMI. Head: Atraumatic. Nose: No congestion/rhinnorhea. Mouth/Throat: Mucous membranes are moist.  Oropharynx non-erythematous. Neck: No stridor.   Cardiovascular: Normal rate, regular rhythm. Grossly normal heart sounds.  Good peripheral circulation. Respiratory: Normal respiratory effort.  No retractions. Lungs CTAB. Gastrointestinal: Soft and nontender. No distention. No CVA tenderness. Genitourinary: deferred Musculoskeletal: No lower extremity tenderness nor edema.  No joint effusions. Neurologic:  Normal speech and language. No gross focal neurologic deficits are appreciated. 5 out of 5 strength in bilateral upper and lower extremities, sensation intact to light touch throughout. + asterixis Skin:  Skin is warm, dry and intact. No rash noted. Psychiatric: Mood and affect are normal. Speech and behavior are normal.  ____________________________________________   LABS (all labs ordered are listed, but only abnormal results are displayed)  Labs Reviewed  COMPREHENSIVE METABOLIC PANEL - Abnormal; Notable for the following:    Glucose, Bld 124 (*)    BUN 23 (*)    Calcium 8.7 (*)    AST 77 (*)    ALT 64 (*)    All other components within normal limits  CBC - Abnormal; Notable for the following:    RBC 3.32 (*)    Hemoglobin 10.5 (*)    HCT 31.4 (*)    RDW 15.2 (*)    Platelets 124 (*)    All other components within normal  limits  GLUCOSE, CAPILLARY - Abnormal; Notable for the following:    Glucose-Capillary 112 (*)    All other components within normal limits  AMMONIA - Abnormal; Notable for the following:    Ammonia 180 (*)    All other components within normal limits  URINALYSIS COMPLETEWITH MICROSCOPIC ([facility] ONLY) - Abnormal; Notable for the following:    Color, Urine YELLOW (*)    APPearance CLEAR (*)    Hgb urine dipstick 2+ (*)    Squamous Epithelial / LPF 0-5 (*)    All other components within normal limits  ETHANOL  AMMONIA  CBG MONITORING, ED   ____________________________________________  EKG  ED ECG REPORT I, Howard [name], the attending physician, personally viewed and interpreted this ECG.   Date: 10/28/2015  EKG Time: 14:17  Rate: 72  Rhythm: normal EKG, normal sinus rhythm  Axis: normal  Intervals:none  ST&T Change: No acute ST elevation  ____________________________________________  RADIOLOGY  CXR  IMPRESSION: No active cardiopulmonary disease.  ____________________________________________   PROCEDURES  Procedure(s) performed: None  Critical Care performed: No  ____________________________________________   INITIAL IMPRESSION / ASSESSMENT AND PLAN / ED COURSE  Pertinent labs & imaging results that were available during my care of the patient were reviewed by me and considered in my medical decision making (see chart for details).  Howard [name] is a 35 y.o. male with history of  hypertension, schizophrenia, hyperlipidemia, diabetes, alcoholic liver cirrhosis with ascites, previous GI bleed secondary to esophageal varices who presents for evaluation of altered mental status today. I suspect his symptoms today are again secondary to hepatic encephalopathy as he does  have asterixis. Ammonia is elevated at 180, lactulose ordered. CMP with mild AST and ALT elevations. CBC is notable for continued anemia with hemoglobin of 10.5. 11 days ago his hemoglobin was 11.  He denies any bloody stools or dark or tarry stools however when I did perform a rectal exam during my exam just prior to his previous admission he had brown stool in the rectal vault which was strongly guaiac positive. It does not appear that this was addressed during his inpatient admission. He is not tachycardic or hypotensive and does not appear to have a brisk  GI bleed. I discussed this with the hospitalist as well as need for admission. ____________________________________________   FINAL CLINICAL IMPRESSION(S) / ED DIAGNOSES  Final diagnoses:  Hepatic encephalopathy (HCC)      Howard Doss, MD 10/28/15 1745

## 2015-10-28 NOTE — H&P (Signed)
[facility] [place] at [facility]   PATIENT NAME: Howard [name]    MR#:  [id number]  DATE OF BIRTH:  21-May-1981  DATE OF ADMISSION:  10/28/2015  PRIMARY CARE PHYSICIAN: Imelda Pillow, NP   REQUESTING/REFERRING PHYSICIAN: Chari Manning  CHIEF COMPLAINT:   Chief Complaint  Patient presents with  . Altered Mental Status    HISTORY OF PRESENT ILLNESS: Howard [name]  is a 35 y.o. male with a known history of hypertension, hyperlipidemia, diabetes, liver cirrhosis, diabetes, heart disease, esophageal verisis- was recently admitted to hospital for hepatic encephalopathy and sent home. He drank 40 ounces of alcohol last night, and then started having abdominal pain nausea and vomiting and was shaking this morning so his mother drove him to the emergency room today again. His ammonia noted to be 180, and he was slightly confused so given as admission for hepatic encephalopathy.  His stool guaiac was noted to be mildly positive by ER physician.   PAST MEDICAL HISTORY:   Past Medical History  Diagnosis Date  . Hypertension   . Schizophrenia (HCC)   . Hyperlipemia   . Depression   . Diabetes (HCC)   . Liver disease   . Diabetes mellitus, type II (HCC)   . Heart disease   . Alcoholic cirrhosis of liver with ascites (HCC)   . Esophageal varices (HCC)   . Portal hypertensive gastropathy     PAST SURGICAL HISTORY: Past Surgical History  Procedure Laterality Date  . No past surgeries    . Esophagogastroduodenoscopy (egd) with propofol N/A 08/03/2015    Procedure: ESOPHAGOGASTRODUODENOSCOPY (EGD) WITH PROPOFOL;  Surgeon: Elnita Maxwell, MD;  Location: [facility] ENDOSCOPY;  Service: Endoscopy;  Laterality: N/A;  . Esophagogastroduodenoscopy (egd) with propofol N/A 08/31/2015    Procedure: ESOPHAGOGASTRODUODENOSCOPY (EGD) WITH PROPOFOL;  Surgeon: Elnita Maxwell, MD;  Location: [facility] ENDOSCOPY;  Service: Endoscopy;  Laterality: N/A;  . Esophagogastroduodenoscopy N/A  10/05/2015    Procedure: ESOPHAGOGASTRODUODENOSCOPY (EGD);  Surgeon: Elnita Maxwell, MD;  Location: [facility] ENDOSCOPY;  Service: Endoscopy;  Laterality: N/A;    SOCIAL HISTORY:  Social History  Substance Use Topics  . Smoking status: Never Smoker   . Smokeless tobacco: Never Used  . Alcohol Use: 2.4 oz/week    4 Cans of beer, 0 Shots of liquor, 0 Standard drinks or equivalent per week     Comment: last drank 3 days ago    FAMILY HISTORY:  Family History  Problem Relation Age of Onset  . Heart disease Mother   . Hypertension Mother   . Hyperlipidemia Mother   . Stroke Father   . Heart attack Father   . Hypertension Father   . Heart disease Father   . Alcohol abuse Father   . Heart disease Brother     DRUG ALLERGIES: No Known Allergies  REVIEW OF SYSTEMS:   CONSTITUTIONAL: No fever, fatigue or weakness.  EYES: No blurred or double vision.  EARS, NOSE, AND THROAT: No tinnitus or ear pain.  RESPIRATORY: No cough, shortness of breath, wheezing or hemoptysis.  CARDIOVASCULAR: No chest pain, orthopnea, edema.  GASTROINTESTINAL: Positive for nausea, vomiting, no diarrhea, mild generalized abdominal pain.  GENITOURINARY: No dysuria, hematuria.  ENDOCRINE: No polyuria, nocturia,  HEMATOLOGY: No anemia, easy bruising or bleeding SKIN: No rash or lesion. MUSCULOSKELETAL: No joint pain or arthritis.   NEUROLOGIC: No tingling, numbness, weakness.  PSYCHIATRY: No anxiety or depression.   MEDICATIONS AT HOME:  Prior to Admission medications   Medication Sig Start  Date End Date Taking? Authorizing Provider  albuterol (PROVENTIL HFA;VENTOLIN HFA) 108 (90 BASE) MCG/ACT inhaler Inhale 2 puffs into the lungs every 6 (six) hours as needed for wheezing or shortness of breath.    Yes Historical Provider, MD  aspirin EC 81 MG tablet Take 81 mg by mouth daily.    Yes Historical Provider, MD  citalopram (CELEXA) 20 MG tablet Take 1 tablet (20 mg total) by mouth daily. 10/16/15  Yes Audery Amel, MD  diphenhydrAMINE (BENADRYL) 25 mg capsule TAKE 2 CAPSULES BY MOUTH AT BEDTIME 10/03/15  Yes Audery Amel, MD  lactulose (CHRONULAC) 10 GM/15ML solution Take 45 mLs (30 g total) by mouth every 6 (six) hours. 08/04/15  Yes Ramonita Lab, MD  losartan-hydrochlorothiazide (HYZAAR) 100-25 MG tablet Take 1 tablet by mouth daily.  08/06/15  Yes Historical Provider, MD  metFORMIN (GLUCOPHAGE) 500 MG tablet Take 1 tablet (500 mg total) by mouth 2 (two) times daily with a meal. 06/01/15  Yes Jolanta B Pucilowska, MD  metoprolol succinate (TOPROL-XL) 100 MG 24 hr tablet Take 100 mg by mouth daily.  08/04/15  Yes Historical Provider, MD  Multiple Vitamin (MULTIVITAMIN WITH MINERALS) TABS tablet Take 1 tablet by mouth daily. 08/04/15  Yes Ramonita Lab, MD  nadolol (CORGARD) 40 MG tablet Take 2 tablets (80 mg total) by mouth daily. 08/04/15  Yes Ramonita Lab, MD  OLANZapine (ZYPREXA) 15 MG tablet Take 1 tablet (15 mg total) by mouth at bedtime. 10/16/15  Yes Audery Amel, MD  omeprazole (PRILOSEC) 40 MG capsule Take 40 mg by mouth every morning.    Yes Historical Provider, MD  ondansetron (ZOFRAN) 8 MG tablet TAKE 1 TABLET ORALLY EVERY 8 HOURS AS NEEDED FOR NAUSEA 08/21/15  Yes Historical Provider, MD  rifaximin (XIFAXAN) 550 MG TABS tablet Take 1 tablet (550 mg total) by mouth 2 (two) times daily. 08/04/15  Yes Ramonita Lab, MD  simvastatin (ZOCOR) 40 MG tablet Take 40 mg by mouth at bedtime.    Yes Historical Provider, MD  spironolactone (ALDACTONE) 100 MG tablet Take 100 mg by mouth daily.   Yes Historical Provider, MD      PHYSICAL EXAMINATION:   VITAL SIGNS: Blood pressure 133/70, pulse 63, temperature 98.2 F (36.8 C), temperature source Oral, resp. rate 19, height 6\' 3"  (1.905 m), weight 128.822 kg (284 lb), SpO2 98 %.  GENERAL:  35 y.o.-year-old patient lying in the bed with no acute distress.  EYES: Pupils equal, round, reactive to light and accommodation. No scleral icterus. Extraocular  muscles intact.  HEENT: Head atraumatic, normocephalic. Oropharynx and nasopharynx clear.  NECK:  Supple, no jugular venous distention. No thyroid enlargement, no tenderness.  LUNGS: Normal breath sounds bilaterally, no wheezing, rales,rhonchi or crepitation. No use of accessory muscles of respiration.  CARDIOVASCULAR: S1, S2 normal. No murmurs, rubs, or gallops.  ABDOMEN: Soft, mild tender, slightly distended. Bowel sounds present. No organomegaly or mass.  EXTREMITIES: No pedal edema, cyanosis, or clubbing.  NEUROLOGIC: Cranial nerves II through XII are intact. Muscle strength 5/5 in all extremities. Sensation intact. Gait not checked.  PSYCHIATRIC: The patient is alert and oriented x 3.  SKIN: No obvious rash, lesion, or ulcer.   LABORATORY PANEL:   CBC  Recent Labs Lab 10/28/15 1415  WBC 6.4  HGB 10.5*  HCT 31.4*  PLT 124*  MCV 94.6  MCH 31.7  MCHC 33.5  RDW 15.2*   ------------------------------------------------------------------------------------------------------------------  Chemistries   Recent Labs Lab 10/28/15 1415  NA 138  K 3.7  CL 108  CO2 22  GLUCOSE 124*  BUN 23*  CREATININE 1.01  CALCIUM 8.7*  AST 77*  ALT 64*  ALKPHOS 126  BILITOT 1.2   ------------------------------------------------------------------------------------------------------------------ estimated creatinine clearance is 146.2 mL/min (by C-G formula based on Cr of 1.01). ------------------------------------------------------------------------------------------------------------------ No results for input(s): TSH, T4TOTAL, T3FREE, THYROIDAB in the last 72 hours.  Invalid input(s): FREET3   Coagulation profile No results for input(s): INR, PROTIME in the last 168 hours. ------------------------------------------------------------------------------------------------------------------- No results for input(s): DDIMER in the last 72  hours. -------------------------------------------------------------------------------------------------------------------  Cardiac Enzymes No results for input(s): CKMB, TROPONINI, MYOGLOBIN in the last 168 hours.  Invalid input(s): CK ------------------------------------------------------------------------------------------------------------------ Invalid input(s): POCBNP  ---------------------------------------------------------------------------------------------------------------  Urinalysis    Component Value Date/Time   COLORURINE YELLOW* 10/28/2015 1512   COLORURINE Yellow 12/25/2014 1404   APPEARANCEUR CLEAR* 10/28/2015 1512   APPEARANCEUR Clear 12/25/2014 1404   LABSPEC 1.021 10/28/2015 1512   LABSPEC 1.016 12/25/2014 1404   PHURINE 5.0 10/28/2015 1512   PHURINE 5.0 12/25/2014 1404   GLUCOSEU NEGATIVE 10/28/2015 1512   GLUCOSEU Negative 12/25/2014 1404   HGBUR 2+* 10/28/2015 1512   HGBUR 3+ 12/25/2014 1404   BILIRUBINUR NEGATIVE 10/28/2015 1512   BILIRUBINUR Negative 12/25/2014 1404   KETONESUR NEGATIVE 10/28/2015 1512   KETONESUR Negative 12/25/2014 1404   PROTEINUR NEGATIVE 10/28/2015 1512   PROTEINUR 30 mg/dL 16/06/9603 5409   NITRITE NEGATIVE 10/28/2015 1512   NITRITE Negative 12/25/2014 1404   LEUKOCYTESUR NEGATIVE 10/28/2015 1512   LEUKOCYTESUR Negative 12/25/2014 1404     RADIOLOGY: Dg Chest 2 View  10/28/2015  CLINICAL DATA:  Chest pain that started today. Known cirrhosis. Uses O2 at home, but denies lung disease. EXAM: CHEST  2 VIEW COMPARISON:  10/16/2015 FINDINGS: The heart size and mediastinal contours are within normal limits. Both lungs are clear. The visualized skeletal structures are unremarkable. IMPRESSION: No active cardiopulmonary disease. Electronically Signed   By: Norva Pavlov M.D.   On: 10/28/2015 15:49    IMPRESSION AND PLAN:  * Hepatic encephalopathy   Continue oral lactulose and rifaximin.  Follow ammonia level tomorrow  morning.  * Alcoholism  He is not a regular alcohol drinker but have some confusion.  Watch for withdrawal signs, and will put on CIWA protocol if required.  * Diabetes  We'll keep on clear liquid diet so I would hold his baseline diabetic medication and just give him sliding scale coverage as required.  * Hypertension   Continue metoprolol, nadolol and continue spironolactone.  * Nausea and vomiting  We'll give Zofran as needed.  * Guaiac positive stool  Currently hemoglobin is stable, continue monitoring.  All the records are reviewed and case discussed with ED provider. Management plans discussed with the patient, family and they are in agreement.  CODE STATUS: Full Code Status History    Date Active Date Inactive Code Status Order ID Comments User Context   10/16/2015 11:47 PM 10/17/2015  6:00 PM Full Code [id number]  Oralia Manis, MD Inpatient   07/30/2015  5:35 PM 08/04/2015  8:34 PM Full Code [id number]  Enedina Finner, MD Inpatient   07/05/2015  1:16 AM 07/08/2015  7:22 PM Full Code [id number]  Oralia Manis, MD Inpatient   05/30/2015  7:28 PM 06/01/2015  3:36 PM Full Code [id number]  Audery Amel, MD Inpatient       TOTAL TIME TAKING CARE OF THIS PATIENT: 50 minutes.    Altamese Dilling M.D on 10/28/2015   Between 7am to 6pm - Pager -  [phone]  After 6pm go to [email or web address] - password [email or web address]  [facility]  Office  [phone]  CC: Primary care physician; Imelda Pillow, NP   Note: This dictation was prepared with Dragon dictation along with smaller phrase technology. Any transcriptional errors that result from this process are unintentional.

## 2015-10-28 NOTE — ED Notes (Signed)
Pt reports he drank about 40 ounces of beer last night.

## 2015-10-28 NOTE — ED Notes (Addendum)
Mom reports pt has been more shaky today and has had a "blank stare" for past 3 days. She thinks it may have to due with his cirrhosis of the liver. Mom also states he has been more confused lately. Denies fever. Pt also complains of chest pain when walking that started today

## 2015-10-28 NOTE — ED Notes (Signed)
8oz of orange juice given due to CBG 75.

## 2015-10-29 LAB — CBC
HEMATOCRIT: 28.8 % — AB (ref 40.0–52.0)
HEMOGLOBIN: 9.7 g/dL — AB (ref 13.0–18.0)
MCH: 31.5 pg (ref 26.0–34.0)
MCHC: 33.7 g/dL (ref 32.0–36.0)
MCV: 93.5 fL (ref 80.0–100.0)
Platelets: 103 10*3/uL — ABNORMAL LOW (ref 150–440)
RBC: 3.08 MIL/uL — ABNORMAL LOW (ref 4.40–5.90)
RDW: 14.9 % — ABNORMAL HIGH (ref 11.5–14.5)
WBC: 6.1 10*3/uL (ref 3.8–10.6)

## 2015-10-29 LAB — GLUCOSE, CAPILLARY
GLUCOSE-CAPILLARY: 73 mg/dL (ref 65–99)
GLUCOSE-CAPILLARY: 86 mg/dL (ref 65–99)

## 2015-10-29 LAB — BASIC METABOLIC PANEL
ANION GAP: 6 (ref 5–15)
BUN: 21 mg/dL — ABNORMAL HIGH (ref 6–20)
CALCIUM: 8.7 mg/dL — AB (ref 8.9–10.3)
CO2: 25 mmol/L (ref 22–32)
Chloride: 110 mmol/L (ref 101–111)
Creatinine, Ser: 0.92 mg/dL (ref 0.61–1.24)
Glucose, Bld: 78 mg/dL (ref 65–99)
POTASSIUM: 3.6 mmol/L (ref 3.5–5.1)
SODIUM: 141 mmol/L (ref 135–145)

## 2015-10-29 LAB — AMMONIA: AMMONIA: 53 umol/L — AB (ref 9–35)

## 2015-10-29 MED ORDER — LACTULOSE 10 GM/15ML PO SOLN: 30.0000 g | Freq: Four times a day (QID) | ORAL | Status: DC

## 2015-10-29 MED ORDER — RIFAXIMIN 550 MG PO TABS: 550.0000 mg | ORAL_TABLET | Freq: Two times a day (BID) | ORAL | Status: DC

## 2015-10-29 NOTE — Care Management Important Message (Signed)
Important Message  Patient Details  Name: Howard [name] MRN: [id number] Date of Birth: 1980/10/23   Medicare Important Message Given:  Yes    Nikash Mortensen A, RN 10/29/2015, 7:26 AM

## 2015-10-29 NOTE — Care Management Note (Signed)
Case Management Note  Patient Details  Name: MIKKEL CHARRETTE MRN: [id number] Date of Birth: 1981-01-24  Subjective/Objective:      34yo Mr Marrell Dicaprio resides with his Mother who provides transportation for him. He was admitted 10/28/15 per elevated ammonia levels. Admits to confusion about how much lactulose to take at home and his [facility] nurse is providing teaching about how to measure out the correct amount of lactulose. Chronic 2L N/C at home provided by [facility]. No home health orders. Discharge home today.               Action/Plan:   Expected Discharge Date:                  Expected Discharge Plan:     In-House Referral:     Discharge planning Services     Post Acute Care Choice:    Choice offered to:     DME Arranged:    DME Agency:     HH Arranged:    HH Agency:     Status of Service:     Medicare Important Message Given:  Yes Date Medicare IM Given:    Medicare IM give by:    Date Additional Medicare IM Given:    Additional Medicare Important Message give by:     If discussed at Long Length of Stay Meetings, dates discussed:    Additional Comments:  Kishan Wachsmuth A, RN 10/29/2015, 10:52 AM

## 2015-10-29 NOTE — Discharge Instructions (Signed)
Hepatic Encephalopathy Hepatic encephalopathy is a loss of brain function from advanced liver disease. The effects of the condition depend on the type of liver damage and how severe it is. In some cases, hepatic encephalopathy can be reversed. CAUSES The exact cause of hepatic encephalopathy is not known. RISK FACTORS You have a higher risk of getting this condition if your liver is damaged. When the liver is damaged harmful substances called toxins can build up in the body. Certain toxins, such as ammonia, can harm your brain. Conditions that can cause liver damage include:  An infection.  Dehydration.  Intestinal bleeding.  Drinking too much alcohol.  Taking certain medicines, including tranquilizers, water pills (diuretics), antidepressants, or sleeping pills. SIGNS AND SYMPTOMS Signs and symptoms may develop suddenly. Or, they may develop slowly and get worse gradually. Symptoms can range from mild to severe. Mild Hepatic Encephalopathy  Mild confusion.  Personality and mood changes.  Anxiety and agitation.  Drowsiness.  Loss of mental abilities.  Musty or sweet-smelling breath. Worsening or Severe Hepatic Encephalopathy  Slowed movement.  Slurred speech.  Extreme personality changes.  Disorientation.  Abnormal shaking or flapping of the hands.  Coma. DIAGNOSIS To make a diagnosis, your health care provider will do a physical exam. To rule out other causes of your signs and symptoms, he or she may order tests. You may have:  Blood tests. These may be done to check your ammonia level, measure how long it takes your blood to clot, and check for infection.  Liver function tests. These may be done to check how well your liver is working.  MRI and CT scans. These may be done to check for a brain disorder.  Electroencephalogram (EEG). This may be done to measure the electrical activity in your brain. TREATMENT The first step in treatment is identifying and  treating possible triggers. The next step is involves taking medicine to lower the level of toxins in the body and to prevent ammonia from building up. You may need to take:  Antibiotics to reduce the ammonia-producing bacteria in your gut.  Lactulose to help flush ammonia from the gut. HOME CARE INSTRUCTIONS Eating and Drinking  Follow a low-protein diet that includes plenty of fruits, vegetables, and whole grains, as directed by your health care provider. Ammonia is produced when you digest high-protein foods.  Work with a dietitian or with your health care provider to make sure you are getting the right balance of protein and minerals.  Drink enough fluids to keep your urine clear or pale yellow. Drinking plenty of water helps prevent constipation.  Do not drink alcohol or use illegal drugs. Medicines  Only take medicine as directed by your health care provider.  If you were prescribed an antibiotic medicine, finish it all even if you start to feel better.  Do not start any new medicines, including over-the-counter medicines, without first checking with your health care provider. SEEK MEDICAL CARE IF:  You have new symptoms.  Your symptoms change.  Your symptoms get worse.  You have a fever.  You are constipated.  You have persistent nausea, vomiting, or diarrhea. SEEK IMMEDIATE MEDICAL CARE IF:  You become very confused or drowsy.  You vomit blood or material that looks like coffee grounds.  Your stool is bloody or black or looks like tar.   This information is not intended to replace advice given to you by your health care provider. Make sure you discuss any questions you have with your health care provider.     Document Released: 11/18/2006 Document Revised: 09/29/2014 Document Reviewed: 04/26/2014 Elsevier Interactive Patient Education 2016 [employer]  

## 2015-10-29 NOTE — Discharge Summary (Signed)
[facility] - [place] at [facility] [address]   PATIENT NAME: Howard [name]    MR#:  [id number]  DATE OF BIRTH:  July 01, 1981  DATE OF ADMISSION:  10/28/2015 ADMITTING PHYSICIAN: Altamese Dilling, MD  DATE OF DISCHARGE: 10/29/2015  PRIMARY CARE PHYSICIAN: Imelda Pillow, NP    ADMISSION DIAGNOSIS:  Hepatic encephalopathy (HCC) [K72.90]  DISCHARGE DIAGNOSIS:  Active Problems:   Hepatic encephalopathy (HCC)  SECONDARY DIAGNOSIS:   Past Medical History  Diagnosis Date  . Hypertension   . Schizophrenia (HCC)   . Hyperlipemia   . Depression   . Diabetes (HCC)   . Liver disease   . Diabetes mellitus, type II (HCC)   . Heart disease   . Alcoholic cirrhosis of liver with ascites (HCC)   . Esophageal varices (HCC)   . Portal hypertensive gastropathy     HOSPITAL COURSE:  35 y.o. male with a known history of hypertension, hyperlipidemia, diabetes, liver cirrhosis, diabetes, heart disease, esophageal verisis has had several readmissions for hepatic encephalopathy and was admitted for same this time.  He drank 40 ounces of alcohol last night, and then started having abdominal pain nausea and vomiting and was shaking this morning so his mother drove him to the emergency room again. His ammonia noted to be 180, and he was slightly confused so given as admission for hepatic encephalopathy.  Please See dr Jim Desanctis dictated history and physical for further details.  Patient was started on lactulose and Xifaxan and his ammonia has come down at 53.  He has not had any further nausea, vomiting or abdominal pain and has been tolerating diet.  He is also back to his normal mental status and is being discharged home in stable condition.  He is agreeable with discharge plans. DISCHARGE CONDITIONS:   stable  CONSULTS OBTAINED:   none  DRUG ALLERGIES:  No Known Allergies  DISCHARGE MEDICATIONS:   Current Discharge Medication List    CONTINUE these medications which have  CHANGED   Details  lactulose (CHRONULAC) 10 GM/15ML solution Take 45 mLs (30 g total) by mouth every 6 (six) hours. Qty: 240 mL, Refills: 0    rifaximin (XIFAXAN) 550 MG TABS tablet Take 1 tablet (550 mg total) by mouth 2 (two) times daily. Qty: 60 tablet, Refills: 3      CONTINUE these medications which have NOT CHANGED   Details  albuterol (PROVENTIL HFA;VENTOLIN HFA) 108 (90 BASE) MCG/ACT inhaler Inhale 2 puffs into the lungs every 6 (six) hours as needed for wheezing or shortness of breath.     aspirin EC 81 MG tablet Take 81 mg by mouth daily.     citalopram (CELEXA) 20 MG tablet Take 1 tablet (20 mg total) by mouth daily. Qty: 90 tablet, Refills: 1    diphenhydrAMINE (BENADRYL) 25 mg capsule TAKE 2 CAPSULES BY MOUTH AT BEDTIME Qty: 60 capsule, Refills: 2    losartan-hydrochlorothiazide (HYZAAR) 100-25 MG tablet Take 1 tablet by mouth daily.     metFORMIN (GLUCOPHAGE) 500 MG tablet Take 1 tablet (500 mg total) by mouth 2 (two) times daily with a meal. Qty: 60 tablet, Refills: 3    metoprolol succinate (TOPROL-XL) 100 MG 24 hr tablet Take 100 mg by mouth daily.     Multiple Vitamin (MULTIVITAMIN WITH MINERALS) TABS tablet Take 1 tablet by mouth daily. Qty: 30 tablet, Refills: 0    nadolol (CORGARD) 40 MG tablet Take 2 tablets (80 mg total) by mouth daily. Qty: 60 tablet, Refills: 0  OLANZapine (ZYPREXA) 15 MG tablet Take 1 tablet (15 mg total) by mouth at bedtime. Qty: 90 tablet, Refills: 1    omeprazole (PRILOSEC) 40 MG capsule Take 40 mg by mouth every morning.     ondansetron (ZOFRAN) 8 MG tablet TAKE 1 TABLET ORALLY EVERY 8 HOURS AS NEEDED FOR NAUSEA Refills: 0    simvastatin (ZOCOR) 40 MG tablet Take 40 mg by mouth at bedtime.     spironolactone (ALDACTONE) 100 MG tablet Take 100 mg by mouth daily.       DISCHARGE INSTRUCTIONS:    DIET:  Regular diet  DISCHARGE CONDITION:  Good  ACTIVITY:  Activity as tolerated  OXYGEN:  Home Oxygen: No.    Oxygen Delivery: room air  DISCHARGE LOCATION:  home   If you experience worsening of your admission symptoms, develop shortness of breath, life threatening emergency, suicidal or homicidal thoughts you must seek medical attention immediately by calling 911 or calling your MD immediately  if symptoms less severe.  You Must read complete instructions/literature along with all the possible adverse reactions/side effects for all the Medicines you take and that have been prescribed to you. Take any new Medicines after you have completely understood and accpet all the possible adverse reactions/side effects.   Please note  You were cared for by a hospitalist during your hospital stay. If you have any questions about your discharge medications or the care you received while you were in the hospital after you are discharged, you can call the unit and asked to speak with the hospitalist on call if the hospitalist that took care of you is not available. Once you are discharged, your primary care physician will handle any further medical issues. Please note that NO REFILLS for any discharge medications will be authorized once you are discharged, as it is imperative that you return to your primary care physician (or establish a relationship with a primary care physician if you do not have one) for your aftercare needs so that they can reassess your need for medications and monitor your lab values.    On the day of Discharge:  VITAL SIGNS:  Blood pressure 142/77, pulse 58, temperature 98 F (36.7 C), temperature source Oral, resp. rate 20, height  (1.905 m), weight 128.822 kg (284 lb), SpO2 99 %. PHYSICAL EXAMINATION:  GENERAL:  35 y.o.-year-old patient lying in the bed with no acute distress.  EYES: Pupils equal, round, reactive to light and accommodation. No scleral icterus. Extraocular muscles intact.  HEENT: Head atraumatic, normocephalic. Oropharynx and nasopharynx clear.  NECK:  Supple, no  jugular venous distention. No thyroid enlargement, no tenderness.  LUNGS: Normal breath sounds bilaterally, no wheezing, rales,rhonchi or crepitation. No use of accessory muscles of respiration.  CARDIOVASCULAR: S1, S2 normal. No murmurs, rubs, or gallops.  ABDOMEN: Soft, non-tender, non-distended. Bowel sounds present. No organomegaly or mass.  EXTREMITIES: No pedal edema, cyanosis, or clubbing.  NEUROLOGIC: Cranial nerves II through XII are intact. Muscle strength 5/5 in all extremities. Sensation intact. Gait not checked.  PSYCHIATRIC: The patient is alert and oriented x 3.  SKIN: No obvious rash, lesion, or ulcer.  DATA REVIEW:   CBC  Recent Labs Lab 10/29/15 0329  WBC 6.1  HGB 9.7*  HCT 28.8*  PLT 103*    Chemistries   Recent Labs Lab 10/28/15 1415 10/29/15 0329  NA 138 141  K 3.7 3.6  CL 108 110  CO2 22 25  GLUCOSE 124* 78  BUN 23* 21*  CREATININE  1.01 0.92  CALCIUM 8.7* 8.7*  AST 77*  --   ALT 64*  --   ALKPHOS 126  --   BILITOT 1.2  --     Management plans discussed with the patient, family and they are in agreement.  CODE STATUS: Full Code  TOTAL TIME TAKING CARE OF THIS PATIENT: 55 minutes.    [facility], Torrin Crihfield M.D on 10/29/2015 at 9:19 AM  Between 7am to 6pm - Pager - [phone]  After 6pm go to [email or web address] - password [email or web address]  [facility]  Office  [phone]  CC: Primary care physician; Imelda Pillow, NP   Note: This dictation was prepared with Dragon dictation along with smaller phrase technology. Any transcriptional errors that result from this process are unintentional.

## 2015-11-04 ENCOUNTER — Emergency Department
Admission: EM | Admit: 2015-11-04 | Discharge: 2015-11-05 | Disposition: A | Discharge status: Home / Self Care | Payer: Medicare Other | Source: Home / Self Care | Attending: Emergency Medicine | Admitting: Emergency Medicine

## 2015-11-04 ENCOUNTER — Emergency Department: Payer: Medicare Other

## 2015-11-04 ENCOUNTER — Encounter: Payer: Self-pay | Admitting: Emergency Medicine

## 2015-11-04 DIAGNOSIS — R7989 Other specified abnormal findings of blood chemistry: Secondary | ICD-10-CM

## 2015-11-04 DIAGNOSIS — R0602 Shortness of breath: Secondary | ICD-10-CM

## 2015-11-04 DIAGNOSIS — Z7982 Long term (current) use of aspirin: Secondary | ICD-10-CM | POA: Insufficient documentation

## 2015-11-04 DIAGNOSIS — M549 Dorsalgia, unspecified: Secondary | ICD-10-CM

## 2015-11-04 DIAGNOSIS — R079 Chest pain, unspecified: Secondary | ICD-10-CM

## 2015-11-04 DIAGNOSIS — R1012 Left upper quadrant pain: Secondary | ICD-10-CM

## 2015-11-04 DIAGNOSIS — K7290 Hepatic failure, unspecified without coma: Secondary | ICD-10-CM | POA: Diagnosis not present

## 2015-11-04 DIAGNOSIS — R10A0 Flank pain, unspecified side: Secondary | ICD-10-CM

## 2015-11-04 DIAGNOSIS — E7220 Disorder of urea cycle metabolism, unspecified: Secondary | ICD-10-CM | POA: Insufficient documentation

## 2015-11-04 DIAGNOSIS — I10 Essential (primary) hypertension: Secondary | ICD-10-CM | POA: Insufficient documentation

## 2015-11-04 DIAGNOSIS — Z79899 Other long term (current) drug therapy: Secondary | ICD-10-CM | POA: Insufficient documentation

## 2015-11-04 DIAGNOSIS — E119 Type 2 diabetes mellitus without complications: Secondary | ICD-10-CM | POA: Insufficient documentation

## 2015-11-04 DIAGNOSIS — R110 Nausea: Secondary | ICD-10-CM

## 2015-11-04 DIAGNOSIS — Z7984 Long term (current) use of oral hypoglycemic drugs: Secondary | ICD-10-CM

## 2015-11-04 DIAGNOSIS — R109 Unspecified abdominal pain: Secondary | ICD-10-CM

## 2015-11-04 LAB — BASIC METABOLIC PANEL
ANION GAP: 7 (ref 5–15)
BUN: 22 mg/dL — ABNORMAL HIGH (ref 6–20)
CALCIUM: 9 mg/dL (ref 8.9–10.3)
CO2: 21 mmol/L — ABNORMAL LOW (ref 22–32)
Chloride: 113 mmol/L — ABNORMAL HIGH (ref 101–111)
Creatinine, Ser: 1.3 mg/dL — ABNORMAL HIGH (ref 0.61–1.24)
Glucose, Bld: 137 mg/dL — ABNORMAL HIGH (ref 65–99)
POTASSIUM: 3.9 mmol/L (ref 3.5–5.1)
Sodium: 141 mmol/L (ref 135–145)

## 2015-11-04 LAB — URINALYSIS COMPLETE WITH MICROSCOPIC (ARMC ONLY)
BILIRUBIN URINE: NEGATIVE
Glucose, UA: NEGATIVE mg/dL
Ketones, ur: NEGATIVE mg/dL
LEUKOCYTES UA: NEGATIVE
Nitrite: NEGATIVE
PH: 5 (ref 5.0–8.0)
Protein, ur: NEGATIVE mg/dL
SPECIFIC GRAVITY, URINE: 1.023 (ref 1.005–1.030)

## 2015-11-04 LAB — CBC
HEMATOCRIT: 30 % — AB (ref 40.0–52.0)
HEMOGLOBIN: 10.1 g/dL — AB (ref 13.0–18.0)
MCH: 32 pg (ref 26.0–34.0)
MCHC: 33.6 g/dL (ref 32.0–36.0)
MCV: 95 fL (ref 80.0–100.0)
Platelets: 133 10*3/uL — ABNORMAL LOW (ref 150–440)
RBC: 3.16 MIL/uL — AB (ref 4.40–5.90)
RDW: 15.2 % — ABNORMAL HIGH (ref 11.5–14.5)
WBC: 7.1 10*3/uL (ref 3.8–10.6)

## 2015-11-04 LAB — TROPONIN I: Troponin I: <0.03 ng/mL (ref <0.031)

## 2015-11-04 MED ORDER — METOCLOPRAMIDE HCL 5 MG/ML IJ SOLN
10.0000 mg | Freq: Once | INTRAMUSCULAR | Status: AC
  Administered 2015-11-05: 10 mg via INTRAVENOUS
  Filled 2015-11-04: qty 2

## 2015-11-04 MED ORDER — ONDANSETRON 4 MG PO TBDP
ORAL_TABLET | ORAL | Status: AC
  Filled 2015-11-04: qty 1

## 2015-11-04 MED ORDER — ONDANSETRON 4 MG PO TBDP
4.0000 mg | ORAL_TABLET | Freq: Once | ORAL | Status: AC | PRN
  Administered 2015-11-04: 4 mg via ORAL

## 2015-11-04 MED ORDER — SODIUM CHLORIDE 0.9 % IV BOLUS (SEPSIS)
1000.0000 mL | Freq: Once | INTRAVENOUS | Status: AC
  Administered 2015-11-05: 1000 mL via INTRAVENOUS

## 2015-11-04 NOTE — ED Provider Notes (Signed)
[facility] Emergency Department Provider Note  ____________________________________________  Time seen: Approximately 11:08 PM  I have reviewed the triage vital signs and the nursing notes.   HISTORY  Chief Complaint Chest Pain; Back Pain; Nausea; and Dizziness    HPI Howard [name] is a 35 y.o. male who comes into the hospital today with some chest pain and nausea. The patient reports that he was sitting in a chair when the pain started. He feels pain across his chest that sharp and stabbing. He did not take anything for the pain because they're ready took 81 mg of aspirin today. The patient also reports that he's had some shortness of breath and pain when he takes a deep breath. He currently rates his pain a 4 out of 10 in intensity but also reports that his kidneys hurt as well. The patient quit drinking 8 days ago and reports that he had some continued nausea as well for the last few days. The patient reports that although he's not been drinking he feels hung over. The patient has been recently admitted to the hospital for elevated ammonia levels. The patient also reports his abdomen is tender and he uses oxygen at home. The patient is here for evaluation of his symptoms.   Past Medical History  Diagnosis Date  . Hypertension   . Schizophrenia (HCC)   . Hyperlipemia   . Depression   . Diabetes (HCC)   . Liver disease   . Diabetes mellitus, type II (HCC)   . Heart disease   . Alcoholic cirrhosis of liver with ascites (HCC)   . Esophageal varices (HCC)   . Portal hypertensive gastropathy     Patient Active Problem List   Diagnosis Date Noted  . Hepatic encephalopathy (HCC) 10/16/2015  . Type 2 diabetes mellitus (HCC) 10/16/2015  . Acute renal failure (ARF) (HCC) 07/30/2015  . 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 Hypoxia 07/04/2015  . Elevated transaminase level 07/04/2015  . DOE (dyspnea on exertion) 07/04/2015  . Alcohol  abuse 05/31/2015  . Hypertension 05/29/2015  . Paranoid schizophrenia (HCC) 03/20/2015    Past Surgical History  Procedure Laterality Date  . No past surgeries    . Esophagogastroduodenoscopy (egd) with propofol N/A 08/03/2015    Procedure: ESOPHAGOGASTRODUODENOSCOPY (EGD) WITH PROPOFOL;  Surgeon: Elnita Maxwell, MD;  Location: [facility] ENDOSCOPY;  Service: Endoscopy;  Laterality: N/A;  . Esophagogastroduodenoscopy (egd) with propofol N/A 08/31/2015    Procedure: ESOPHAGOGASTRODUODENOSCOPY (EGD) WITH PROPOFOL;  Surgeon: Elnita Maxwell, MD;  Location: [facility] ENDOSCOPY;  Service: Endoscopy;  Laterality: N/A;  . Esophagogastroduodenoscopy N/A 10/05/2015    Procedure: ESOPHAGOGASTRODUODENOSCOPY (EGD);  Surgeon: Elnita Maxwell, MD;  Location: [facility] ENDOSCOPY;  Service: Endoscopy;  Laterality: N/A;    Current Outpatient Rx  Name  Route  Sig  Dispense  Refill  . albuterol (PROVENTIL HFA;VENTOLIN HFA) 108 (90 BASE) MCG/ACT inhaler   Inhalation   Inhale 2 puffs into the lungs every 6 (six) hours as needed for wheezing or shortness of breath.          Marland Kitchen aspirin EC 81 MG tablet   Oral   Take 81 mg by mouth daily.          . citalopram (CELEXA) 20 MG tablet   Oral   Take 1 tablet (20 mg total) by mouth daily.   90 tablet   1   . diphenhydrAMINE (BENADRYL) 25 mg capsule      TAKE 2 CAPSULES  BY MOUTH AT BEDTIME   60 capsule   2   . lactulose (CHRONULAC) 10 GM/15ML solution   Oral   Take 45 mLs (30 g total) by mouth every 6 (six) hours.   240 mL   0   . losartan-hydrochlorothiazide (HYZAAR) 100-25 MG tablet   Oral   Take 1 tablet by mouth daily.          . metFORMIN (GLUCOPHAGE) 500 MG tablet   Oral   Take 1 tablet (500 mg total) by mouth 2 (two) times daily with a meal.   60 tablet   3   . metoCLOPramide (REGLAN) 10 MG tablet   Oral   Take 1 tablet (10 mg total) by mouth 3 (three) times daily before meals.   20 tablet   0   . metoprolol succinate (TOPROL-XL)  100 MG 24 hr tablet   Oral   Take 100 mg by mouth daily.          . Multiple Vitamin (MULTIVITAMIN WITH MINERALS) TABS tablet   Oral   Take 1 tablet by mouth daily.   30 tablet   0   . nadolol (CORGARD) 40 MG tablet   Oral   Take 2 tablets (80 mg total) by mouth daily.   60 tablet   0   . OLANZapine (ZYPREXA) 15 MG tablet   Oral   Take 1 tablet (15 mg total) by mouth at bedtime.   90 tablet   1   . omeprazole (PRILOSEC) 40 MG capsule   Oral   Take 40 mg by mouth every morning.          . ondansetron (ZOFRAN) 8 MG tablet      TAKE 1 TABLET ORALLY EVERY 8 HOURS AS NEEDED FOR NAUSEA      0   . rifaximin (XIFAXAN) 550 MG TABS tablet   Oral   Take 1 tablet (550 mg total) by mouth 2 (two) times daily.   60 tablet   3   . simvastatin (ZOCOR) 40 MG tablet   Oral   Take 40 mg by mouth at bedtime.          Marland Kitchen spironolactone (ALDACTONE) 100 MG tablet   Oral   Take 100 mg by mouth daily.           Allergies Review of patient's allergies indicates no known allergies.  Family History  Problem Relation Age of Onset  . Heart disease Mother   . Hypertension Mother   . Hyperlipidemia Mother   . Stroke Father   . Heart attack Father   . Hypertension Father   . Heart disease Father   . Alcohol abuse Father   . Heart disease Brother     Social History Social History  Substance Use Topics  . Smoking status: Never Smoker   . Smokeless tobacco: Never Used  . Alcohol Use: No     Comment: last drank 8 days ago    Review of Systems Constitutional: No fever/chills Eyes: No visual changes. ENT: No sore throat. Cardiovascular:  chest pain. Respiratory:  shortness of breath. Gastrointestinal: Nausea and abdominal pain Genitourinary: Negative for dysuria. Musculoskeletal:  back pain. Skin: Negative for rash. Neurological: Negative for headaches, focal weakness or numbness.  10-point ROS otherwise  negative.  ____________________________________________   PHYSICAL EXAM:  VITAL SIGNS: ED Triage Vitals  Enc Vitals Group     BP 11/04/15 1920 114/64 mmHg     Pulse Rate 11/04/15 1920 73  Resp 11/04/15 1920 18     Temp 11/04/15 1920 98.5 F (36.9 C)     Temp Source 11/04/15 1920 Oral     SpO2 11/04/15 1920 98 %     Weight 11/04/15 1920 284 lb (128.822 kg)     Height 11/04/15 1920 6\' 3"  (1.905 m)     Head Cir --      Peak Flow --      Pain Score --      Pain Loc --      Pain Edu? --      Excl. in GC? --     Constitutional: Alert and oriented. Well appearing and in no acute distress. Eyes: Conjunctivae are normal. PERRL. EOMI. Head: Atraumatic. Nose: No congestion/rhinnorhea. Mouth/Throat: Mucous membranes are moist.  Oropharynx non-erythematous. Cardiovascular: Normal rate, regular rhythm. Grossly normal heart sounds.  Good peripheral circulation. Respiratory: Normal respiratory effort.  No retractions. Lungs CTAB. Gastrointestinal: Soft with LUQ tenderness to palpation No distention. Positive bowel sounds Musculoskeletal: No lower extremity tenderness nor edema.   Neurologic:  Normal speech and language. No gross focal neurologic deficits are appreciated. No gait instability. Skin:  Skin is warm, dry and intact. Marland Kitchen Psychiatric: Mood and affect are normal.   ____________________________________________   LABS (all labs ordered are listed, but only abnormal results are displayed)  Labs Reviewed  BASIC METABOLIC PANEL - Abnormal; Notable for the following:    Chloride 113 (*)    CO2 21 (*)    Glucose, Bld 137 (*)    BUN 22 (*)    Creatinine, Ser 1.30 (*)    All other components within normal limits  CBC - Abnormal; Notable for the following:    RBC 3.16 (*)    Hemoglobin 10.1 (*)    HCT 30.0 (*)    RDW 15.2 (*)    Platelets 133 (*)    All other components within normal limits  URINALYSIS COMPLETEWITH MICROSCOPIC ([facility] ONLY) - Abnormal; Notable for the  following:    Color, Urine YELLOW (*)    APPearance HAZY (*)    Hgb urine dipstick 3+ (*)    Bacteria, UA RARE (*)    Squamous Epithelial / LPF 0-5 (*)    All other components within normal limits  AMMONIA - Abnormal; Notable for the following:    Ammonia 183 (*)    All other components within normal limits  TROPONIN I  FIBRIN DERIVATIVES D-DIMER ([facility] ONLY)  TROPONIN I   ____________________________________________  EKG  ED ECG REPORT I, Rebecka Apley, the attending physician, personally viewed and interpreted this ECG.   Date: 11/04/2015  EKG Time: 1925  Rate: 71  Rhythm: normal sinus rhythm  Axis: normal  Intervals:none  ST&T Change: none  ____________________________________________  RADIOLOGY  CXR: Stable lingular scarring and mild changes of chronic bronchitis and or asthma. NO acute cardiopulmonary disease  CT renal stone study: No hydronephrosis or nephrolithiasis, cirrhosis with evidence of portal hypertension, abdominal varices and splenomegaly, no ascites. ____________________________________________   PROCEDURES  Procedure(s) performed: None  Critical Care performed: No  ____________________________________________   INITIAL IMPRESSION / ASSESSMENT AND PLAN / ED COURSE  Pertinent labs & imaging results that were available during my care of the patient were reviewed by me and considered in my medical decision making (see chart for details).  This is a 35 year old male who comes to the hospital today with some chest pain and back pain. The patient was admitted recently with hepatic encephalopathy. I will give  the patient liter of normal saline as it appears she is dehydrated. I will also give him some Reglan and order a d-dimer added onto his labs as well as an ammonia. I will reassess the patient once I received his blood work.  The patient's ammonia level is elevated but he is not altered nor is he confused. I did give the patient a dose of  lactulose and will discharge him home to follow-up with his primary care physician. The patient's CT scan as well as his other testing is unremarkable. ____________________________________________   FINAL CLINICAL IMPRESSION(S) / ED DIAGNOSES  Final diagnoses:  Chest pain, unspecified chest pain type  Nausea  Flank pain  Increased ammonia level      Rebecka Apley, MD 11/05/15 [phone]

## 2015-11-04 NOTE — ED Notes (Signed)
Patient states that he has had chest pain and dizzy that started a couple of hours of ago. Patient states that he is having left lower back pain and nausea for a couple of days. Patient states that he has had decreased urine output today.

## 2015-11-05 ENCOUNTER — Emergency Department: Payer: Medicare Other

## 2015-11-05 DIAGNOSIS — Z7984 Long term (current) use of oral hypoglycemic drugs: Secondary | ICD-10-CM

## 2015-11-05 DIAGNOSIS — K7031 Alcoholic cirrhosis of liver with ascites: Secondary | ICD-10-CM | POA: Diagnosis present

## 2015-11-05 DIAGNOSIS — Z9981 Dependence on supplemental oxygen: Secondary | ICD-10-CM

## 2015-11-05 DIAGNOSIS — E119 Type 2 diabetes mellitus without complications: Secondary | ICD-10-CM | POA: Diagnosis present

## 2015-11-05 DIAGNOSIS — J449 Chronic obstructive pulmonary disease, unspecified: Secondary | ICD-10-CM | POA: Diagnosis present

## 2015-11-05 DIAGNOSIS — Z79899 Other long term (current) drug therapy: Secondary | ICD-10-CM

## 2015-11-05 DIAGNOSIS — F209 Schizophrenia, unspecified: Secondary | ICD-10-CM | POA: Diagnosis present

## 2015-11-05 DIAGNOSIS — Z7951 Long term (current) use of inhaled steroids: Secondary | ICD-10-CM

## 2015-11-05 DIAGNOSIS — J9610 Chronic respiratory failure, unspecified whether with hypoxia or hypercapnia: Secondary | ICD-10-CM | POA: Diagnosis present

## 2015-11-05 DIAGNOSIS — D696 Thrombocytopenia, unspecified: Secondary | ICD-10-CM | POA: Diagnosis present

## 2015-11-05 DIAGNOSIS — I8510 Secondary esophageal varices without bleeding: Secondary | ICD-10-CM | POA: Diagnosis present

## 2015-11-05 DIAGNOSIS — F329 Major depressive disorder, single episode, unspecified: Secondary | ICD-10-CM | POA: Diagnosis present

## 2015-11-05 DIAGNOSIS — K219 Gastro-esophageal reflux disease without esophagitis: Secondary | ICD-10-CM | POA: Diagnosis present

## 2015-11-05 DIAGNOSIS — K7290 Hepatic failure, unspecified without coma: Principal | ICD-10-CM | POA: Diagnosis present

## 2015-11-05 DIAGNOSIS — I10 Essential (primary) hypertension: Secondary | ICD-10-CM | POA: Diagnosis present

## 2015-11-05 LAB — FIBRIN DERIVATIVES D-DIMER (ARMC ONLY): FIBRIN DERIVATIVES D-DIMER (ARMC): 380 (ref 0–499)

## 2015-11-05 LAB — TROPONIN I

## 2015-11-05 LAB — AMMONIA: AMMONIA: 183 umol/L — AB (ref 9–35)

## 2015-11-05 MED ORDER — LACTULOSE 10 GM/15ML PO SOLN
10.0000 g | Freq: Once | ORAL | Status: AC
  Administered 2015-11-05: 10 g via ORAL
  Filled 2015-11-05: qty 30

## 2015-11-05 MED ORDER — METOCLOPRAMIDE HCL 10 MG PO TABS: 10.0000 mg | ORAL_TABLET | Freq: Three times a day (TID) | ORAL | Status: DC

## 2015-11-05 NOTE — ED Notes (Signed)
Patient transported to X-ray 

## 2015-11-05 NOTE — Discharge Instructions (Signed)
Nonspecific Chest Pain  Chest pain can be caused by many different conditions. There is always a chance that your pain could be related to something serious, such as a heart attack or a blood clot in your lungs. Chest pain can also be caused by conditions that are not life-threatening. If you have chest pain, it is very important to follow up with your health care provider. CAUSES  Chest pain can be caused by:  Heartburn.  Pneumonia or bronchitis.  Anxiety or stress.  Inflammation around your heart (pericarditis) or lung (pleuritis or pleurisy).  A blood clot in your lung.  A collapsed lung (pneumothorax). It can develop suddenly on its own (spontaneous pneumothorax) or from trauma to the chest.  Shingles infection (varicella-zoster virus).  Heart attack.  Damage to the bones, muscles, and cartilage that make up your chest wall. This can include:  Bruised bones due to injury.  Strained muscles or cartilage due to frequent or repeated coughing or overwork.  Fracture to one or more ribs.  Sore cartilage due to inflammation (costochondritis). RISK FACTORS  Risk factors for chest pain may include:  Activities that increase your risk for trauma or injury to your chest.  Respiratory infections or conditions that cause frequent coughing.  Medical conditions or overeating that can cause heartburn.  Heart disease or family history of heart disease.  Conditions or health behaviors that increase your risk of developing a blood clot.  Having had chicken pox (varicella zoster). SIGNS AND SYMPTOMS Chest pain can feel like:  Burning or tingling on the surface of your chest or deep in your chest.  Crushing, pressure, aching, or squeezing pain.  Dull or sharp pain that is worse when you move, cough, or take a deep breath.  Pain that is also felt in your back, neck, shoulder, or arm, or pain that spreads to any of these areas. Your chest pain may come and go, or it may stay  constant. DIAGNOSIS Lab tests or other studies may be needed to find the cause of your pain. Your health care provider may have you take a test called an ambulatory ECG (electrocardiogram). An ECG records your heartbeat patterns at the time the test is performed. You may also have other tests, such as:  Transthoracic echocardiogram (TTE). During echocardiography, sound waves are used to create a picture of all of the heart structures and to look at how blood flows through your heart.  Transesophageal echocardiogram (TEE).This is a more advanced imaging test that obtains images from inside your body. It allows your health care provider to see your heart in finer detail.  Cardiac monitoring. This allows your health care provider to monitor your heart rate and rhythm in real time.  Holter monitor. This is a portable device that records your heartbeat and can help to diagnose abnormal heartbeats. It allows your health care provider to track your heart activity for several days, if needed.  Stress tests. These can be done through exercise or by taking medicine that makes your heart beat more quickly.  Blood tests.  Imaging tests. TREATMENT  Your treatment depends on what is causing your chest pain. Treatment may include:  Medicines. These may include:  Acid blockers for heartburn.  Anti-inflammatory medicine.  Pain medicine for inflammatory conditions.  Antibiotic medicine, if an infection is present.  Medicines to dissolve blood clots.  Medicines to treat coronary artery disease.  Supportive care for conditions that do not require medicines. This may include:  Resting.  Applying heat  or cold packs to injured areas.  Limiting activities until pain decreases. HOME CARE INSTRUCTIONS  If you were prescribed an antibiotic medicine, finish it all even if you start to feel better.  Avoid any activities that bring on chest pain.  Do not use any tobacco products, including  cigarettes, chewing tobacco, or electronic cigarettes. If you need help quitting, ask your health care provider.  Do not drink alcohol.  Take medicines only as directed by your health care provider.  Keep all follow-up visits as directed by your health care provider. This is important. This includes any further testing if your chest pain does not go away.  If heartburn is the cause for your chest pain, you may be told to keep your head raised (elevated) while sleeping. This reduces the chance that acid will go from your stomach into your esophagus.  Make lifestyle changes as directed by your health care provider. These may include:  Getting regular exercise. Ask your health care provider to suggest some activities that are safe for you.  Eating a heart-healthy diet. A registered dietitian can help you to learn healthy eating options.  Maintaining a healthy weight.  Managing diabetes, if necessary.  Reducing stress. SEEK MEDICAL CARE IF:  Your chest pain does not go away after treatment.  You have a rash with blisters on your chest.  You have a fever. SEEK IMMEDIATE MEDICAL CARE IF:   Your chest pain is worse.  You have an increasing cough, or you cough up blood.  You have severe abdominal pain.  You have severe weakness.  You faint.  You have chills.  You have sudden, unexplained chest discomfort.  You have sudden, unexplained discomfort in your arms, back, neck, or jaw.  You have shortness of breath at any time.  You suddenly start to sweat, or your skin gets clammy.  You feel nauseous or you vomit.  You suddenly feel light-headed or dizzy.  Your heart begins to beat quickly, or it feels like it is skipping beats. These symptoms may represent a serious problem that is an emergency. Do not wait to see if the symptoms will go away. Get medical help right away. Call your local emergency services (911 in the [place] Do not drive yourself to the hospital.   This  information is not intended to replace advice given to you by your health care provider. Make sure you discuss any questions you have with your health care provider.   Document Released: 06/18/2005 Document Revised: 09/29/2014 Document Reviewed: 04/14/2014 Elsevier Interactive Patient Education 2016 [employer] Inc[employer]  Nausea, Adult Nausea is the feeling that you have an upset stomach or have to vomit. Nausea by itself is not likely a serious concern, but it may be an early sign of more serious medical problems. As nausea gets worse, it can lead to vomiting. If vomiting develops, there is the risk of dehydration.  CAUSES   Viral infections.  Food poisoning.  Medicines.  Pregnancy.  Motion sickness.  Migraine headaches.  Emotional distress.  Severe pain from any source.  Alcohol intoxication. HOME CARE INSTRUCTIONS  Get plenty of rest.  Ask your caregiver about specific rehydration instructions.  Eat small amounts of food and sip liquids more often.  Take all medicines as told by your caregiver. SEEK MEDICAL CARE IF:  You have not improved after 2 days, or you get worse.  You have a headache. SEEK IMMEDIATE MEDICAL CARE IF:   You have a fever.  You faint.  You  keep vomiting or have blood in your vomit.  You are extremely weak or dehydrated.  You have dark or bloody stools.  You have severe chest or abdominal pain. MAKE SURE YOU:  Understand these instructions.  Will watch your condition.  Will get help right away if you are not doing well or get worse.   This information is not intended to replace advice given to you by your health care provider. Make sure you discuss any questions you have with your health care provider.   Document Released: 10/16/2004 Document Revised: 09/29/2014 Document Reviewed: 05/21/2011 Elsevier Interactive Patient Education 2016 Elsevier Inc.  Flank Pain Flank pain refers to pain that is located on the side of the body between  the upper abdomen and the back. The pain may occur over a short period of time (acute) or may be long-term or reoccurring (chronic). It may be mild or severe. Flank pain can be caused by many things. CAUSES  Some of the more common causes of flank pain include:  Muscle strains.   Muscle spasms.   A disease of your spine (vertebral disk disease).   A lung infection (pneumonia).   Fluid around your lungs (pulmonary edema).   A kidney infection.   Kidney stones.   A very painful skin rash caused by the chickenpox virus (shingles).   Gallbladder disease.  HOME CARE INSTRUCTIONS  Home care will depend on the cause of your pain. In general,  Rest as directed by your caregiver.  Drink enough fluids to keep your urine clear or pale yellow.  Only take over-the-counter or prescription medicines as directed by your caregiver. Some medicines may help relieve the pain.  Tell your caregiver about any changes in your pain.  Follow up with your caregiver as directed. SEEK IMMEDIATE MEDICAL CARE IF:   Your pain is not controlled with medicine.   You have new or worsening symptoms.  Your pain increases.   You have abdominal pain.   You have shortness of breath.   You have persistent nausea or vomiting.   You have swelling in your abdomen.   You feel faint or pass out.   You have blood in your urine.  You have a fever or persistent symptoms for more than 2-3 days.  You have a fever and your symptoms suddenly get worse. MAKE SURE YOU:   Understand these instructions.  Will watch your condition.  Will get help right away if you are not doing well or get worse.   This information is not intended to replace advice given to you by your health care provider. Make sure you discuss any questions you have with your health care provider.   Document Released: 10/30/2005 Document Revised: 06/02/2012 Document Reviewed: 04/22/2012 Elsevier Interactive Patient  Education [employer]

## 2015-11-07 ENCOUNTER — Encounter: Payer: Self-pay | Admitting: Emergency Medicine

## 2015-11-07 ENCOUNTER — Inpatient Hospital Stay
Admission: EM | Admit: 2015-11-07 | Discharge: 2015-11-09 | DRG: 442 | Disposition: A | Discharge status: Home Health | Payer: Medicare Other | Attending: Internal Medicine | Admitting: Internal Medicine

## 2015-11-07 DIAGNOSIS — Z79899 Other long term (current) drug therapy: Secondary | ICD-10-CM | POA: Diagnosis not present

## 2015-11-07 DIAGNOSIS — F329 Major depressive disorder, single episode, unspecified: Secondary | ICD-10-CM | POA: Diagnosis present

## 2015-11-07 DIAGNOSIS — K7290 Hepatic failure, unspecified without coma: Secondary | ICD-10-CM | POA: Diagnosis present

## 2015-11-07 DIAGNOSIS — Z7984 Long term (current) use of oral hypoglycemic drugs: Secondary | ICD-10-CM | POA: Diagnosis not present

## 2015-11-07 DIAGNOSIS — I8510 Secondary esophageal varices without bleeding: Secondary | ICD-10-CM | POA: Diagnosis present

## 2015-11-07 DIAGNOSIS — K219 Gastro-esophageal reflux disease without esophagitis: Secondary | ICD-10-CM | POA: Diagnosis present

## 2015-11-07 DIAGNOSIS — J449 Chronic obstructive pulmonary disease, unspecified: Secondary | ICD-10-CM | POA: Diagnosis present

## 2015-11-07 DIAGNOSIS — F209 Schizophrenia, unspecified: Secondary | ICD-10-CM | POA: Diagnosis present

## 2015-11-07 DIAGNOSIS — Z7951 Long term (current) use of inhaled steroids: Secondary | ICD-10-CM | POA: Diagnosis not present

## 2015-11-07 DIAGNOSIS — I10 Essential (primary) hypertension: Secondary | ICD-10-CM | POA: Diagnosis present

## 2015-11-07 DIAGNOSIS — E119 Type 2 diabetes mellitus without complications: Secondary | ICD-10-CM | POA: Diagnosis present

## 2015-11-07 DIAGNOSIS — J9610 Chronic respiratory failure, unspecified whether with hypoxia or hypercapnia: Secondary | ICD-10-CM | POA: Diagnosis present

## 2015-11-07 DIAGNOSIS — K7031 Alcoholic cirrhosis of liver with ascites: Secondary | ICD-10-CM | POA: Diagnosis present

## 2015-11-07 DIAGNOSIS — D696 Thrombocytopenia, unspecified: Secondary | ICD-10-CM | POA: Diagnosis present

## 2015-11-07 DIAGNOSIS — Z9981 Dependence on supplemental oxygen: Secondary | ICD-10-CM | POA: Diagnosis not present

## 2015-11-07 DIAGNOSIS — K7682 Hepatic encephalopathy: Secondary | ICD-10-CM

## 2015-11-07 LAB — COMPREHENSIVE METABOLIC PANEL
ALBUMIN: 3.7 g/dL (ref 3.5–5.0)
ALK PHOS: 117 U/L (ref 38–126)
ALT: 61 U/L (ref 17–63)
AST: 63 U/L — AB (ref 15–41)
Anion gap: 8 (ref 5–15)
BILIRUBIN TOTAL: 0.8 mg/dL (ref 0.3–1.2)
BUN: 17 mg/dL (ref 6–20)
CALCIUM: 9.4 mg/dL (ref 8.9–10.3)
CO2: 23 mmol/L (ref 22–32)
Chloride: 108 mmol/L (ref 101–111)
Creatinine, Ser: 0.91 mg/dL (ref 0.61–1.24)
GFR calc Af Amer: >60 mL/min (ref >60)
GLUCOSE: 116 mg/dL — AB (ref 65–99)
POTASSIUM: 4 mmol/L (ref 3.5–5.1)
Sodium: 139 mmol/L (ref 135–145)
TOTAL PROTEIN: 7.3 g/dL (ref 6.5–8.1)

## 2015-11-07 LAB — LIPASE, BLOOD: Lipase: 28 U/L (ref 11–51)

## 2015-11-07 LAB — CBC
HEMATOCRIT: 30.8 % — AB (ref 40.0–52.0)
Hemoglobin: 10.4 g/dL — ABNORMAL LOW (ref 13.0–18.0)
MCH: 31.8 pg (ref 26.0–34.0)
MCHC: 33.6 g/dL (ref 32.0–36.0)
MCV: 94.5 fL (ref 80.0–100.0)
Platelets: 135 10*3/uL — ABNORMAL LOW (ref 150–440)
RBC: 3.26 MIL/uL — ABNORMAL LOW (ref 4.40–5.90)
RDW: 15.1 % — AB (ref 11.5–14.5)
WBC: 6.3 10*3/uL (ref 3.8–10.6)

## 2015-11-07 LAB — AMMONIA: Ammonia: 157 umol/L — ABNORMAL HIGH (ref 9–35)

## 2015-11-07 MED ORDER — ENOXAPARIN SODIUM 40 MG/0.4ML ~~LOC~~ SOLN
40.0000 mg | SUBCUTANEOUS | Status: DC
  Administered 2015-11-07 – 2015-11-08 (×2): 40 mg via SUBCUTANEOUS
  Filled 2015-11-07 (×2): qty 0.4

## 2015-11-07 MED ORDER — LOSARTAN POTASSIUM-HCTZ 100-25 MG PO TABS: 1.0000 | ORAL_TABLET | Freq: Every day | ORAL | Status: DC

## 2015-11-07 MED ORDER — SIMVASTATIN 40 MG PO TABS
40.0000 mg | ORAL_TABLET | Freq: Every day | ORAL | Status: DC
  Administered 2015-11-07 – 2015-11-08 (×2): 40 mg via ORAL
  Filled 2015-11-07 (×2): qty 1

## 2015-11-07 MED ORDER — ADULT MULTIVITAMIN W/MINERALS CH
1.0000 | ORAL_TABLET | Freq: Every day | ORAL | Status: DC
  Administered 2015-11-08 – 2015-11-09 (×2): 1 via ORAL
  Filled 2015-11-07 (×2): qty 1

## 2015-11-07 MED ORDER — METOCLOPRAMIDE HCL 10 MG PO TABS
10.0000 mg | ORAL_TABLET | Freq: Three times a day (TID) | ORAL | Status: DC
  Administered 2015-11-08 – 2015-11-09 (×3): 10 mg via ORAL
  Filled 2015-11-07 (×7): qty 1

## 2015-11-07 MED ORDER — ACETAMINOPHEN 325 MG PO TABS: 650.0000 mg | ORAL_TABLET | Freq: Four times a day (QID) | ORAL | Status: DC | PRN

## 2015-11-07 MED ORDER — RIFAXIMIN 550 MG PO TABS
550.0000 mg | ORAL_TABLET | Freq: Two times a day (BID) | ORAL | Status: DC
  Administered 2015-11-07 – 2015-11-09 (×4): 550 mg via ORAL
  Filled 2015-11-07 (×6): qty 1

## 2015-11-07 MED ORDER — SPIRONOLACTONE 25 MG PO TABS
100.0000 mg | ORAL_TABLET | Freq: Every day | ORAL | Status: DC
  Administered 2015-11-08 – 2015-11-09 (×2): 100 mg via ORAL
  Filled 2015-11-07 (×2): qty 4

## 2015-11-07 MED ORDER — CITALOPRAM HYDROBROMIDE 20 MG PO TABS
20.0000 mg | ORAL_TABLET | Freq: Every day | ORAL | Status: DC
  Administered 2015-11-08 – 2015-11-09 (×2): 20 mg via ORAL
  Filled 2015-11-07 (×2): qty 1

## 2015-11-07 MED ORDER — LACTULOSE 10 GM/15ML PO SOLN
30.0000 g | Freq: Four times a day (QID) | ORAL | Status: DC
  Administered 2015-11-07 – 2015-11-09 (×7): 30 g via ORAL
  Filled 2015-11-07 (×7): qty 60

## 2015-11-07 MED ORDER — ACETAMINOPHEN 650 MG RE SUPP
650.0000 mg | Freq: Four times a day (QID) | RECTAL | Status: DC | PRN
Start: 2015-11-07 — End: 2015-11-09

## 2015-11-07 MED ORDER — ONDANSETRON HCL 4 MG/2ML IJ SOLN: 4.0000 mg | Freq: Four times a day (QID) | INTRAMUSCULAR | Status: DC | PRN

## 2015-11-07 MED ORDER — PANTOPRAZOLE SODIUM 40 MG PO TBEC
40.0000 mg | DELAYED_RELEASE_TABLET | Freq: Every day | ORAL | Status: DC
  Administered 2015-11-08 – 2015-11-09 (×2): 40 mg via ORAL
  Filled 2015-11-07 (×2): qty 1

## 2015-11-07 MED ORDER — HYDROCHLOROTHIAZIDE 25 MG PO TABS
25.0000 mg | ORAL_TABLET | Freq: Every day | ORAL | Status: DC
  Administered 2015-11-08 – 2015-11-09 (×2): 25 mg via ORAL
  Filled 2015-11-07 (×2): qty 1

## 2015-11-07 MED ORDER — LACTULOSE 10 GM/15ML PO SOLN
ORAL | Status: AC
  Administered 2015-11-07: 30 g via ORAL
  Filled 2015-11-07: qty 30

## 2015-11-07 MED ORDER — LACTULOSE 10 GM/15ML PO SOLN
30.0000 g | Freq: Once | ORAL | Status: AC
  Administered 2015-11-07: 30 g via ORAL

## 2015-11-07 MED ORDER — SODIUM CHLORIDE 0.9% FLUSH
3.0000 mL | Freq: Two times a day (BID) | INTRAVENOUS | Status: DC
  Administered 2015-11-07 – 2015-11-09 (×3): 3 mL via INTRAVENOUS

## 2015-11-07 MED ORDER — NADOLOL 20 MG PO TABS
80.0000 mg | ORAL_TABLET | Freq: Every day | ORAL | Status: DC
  Administered 2015-11-08 – 2015-11-09 (×2): 80 mg via ORAL
  Filled 2015-11-07 (×2): qty 4

## 2015-11-07 MED ORDER — OLANZAPINE 7.5 MG PO TABS
15.0000 mg | ORAL_TABLET | Freq: Every day | ORAL | Status: DC
  Administered 2015-11-07 – 2015-11-08 (×2): 15 mg via ORAL
  Filled 2015-11-07 (×3): qty 2

## 2015-11-07 MED ORDER — ONDANSETRON HCL 4 MG PO TABS
4.0000 mg | ORAL_TABLET | Freq: Four times a day (QID) | ORAL | Status: DC | PRN
  Administered 2015-11-09: 05:00:00 4 mg via ORAL
  Filled 2015-11-07: qty 1

## 2015-11-07 MED ORDER — METFORMIN HCL 500 MG PO TABS
500.0000 mg | ORAL_TABLET | Freq: Two times a day (BID) | ORAL | Status: DC
  Administered 2015-11-08 – 2015-11-09 (×3): 500 mg via ORAL
  Filled 2015-11-07 (×3): qty 1

## 2015-11-07 MED ORDER — LOSARTAN POTASSIUM 50 MG PO TABS
100.0000 mg | ORAL_TABLET | Freq: Every day | ORAL | Status: DC
  Administered 2015-11-08 – 2015-11-09 (×2): 100 mg via ORAL
  Filled 2015-11-07 (×2): qty 2

## 2015-11-07 NOTE — H&P (Signed)
[facility] - [place] at [facility]   PATIENT NAME: Howard [name]    MR#:  [id number]  DATE OF BIRTH:  11-17-1980  DATE OF ADMISSION:  11/07/2015  PRIMARY CARE PHYSICIAN: Imelda Pillow, NP   REQUESTING/REFERRING PHYSICIAN: Dr. Silverio Lay  CHIEF COMPLAINT:   Chief Complaint  Patient presents with  . Nausea  . Tremors  . Altered Mental Status    HISTORY OF PRESENT ILLNESS:  Howard [name]  is a 35 y.o. male with a known history of alcoholic liver cirrhosis, hypertension, schizophrenia, diabetes mellitus, COPD on 2 L oxygen, recent admission for hepatic encephalopathy last week presents again to the hospital secondary to worsening confusion and also tremors that started yesterday. He was recently in the hospital 10 days ago and noted to have a pneumonia greater than 180 and was discharged after treating with lactulose and rifaximin mean. His ammonia was 53 on 10/29/2015. It seems like he had an ammonia level drawn on February 13 and it was 183 during an ER visit 2 days ago for dehydration. He was given a dose of lactulose and discharged home. However patient feels he was nauseous, occasional abdominal pain. Denies any hematemesis or melena. But noticed that his tremors were returning and he started getting more confused and not consistent with his answers and so brought back to the emergency room. Ammonia level is greater than 150. All his other labs look okay, he is being admitted for hepatic encephalopathy. He states that he has been taking his lactulose 4 times a day like he was suggested and also on rifaximin mean.  PAST MEDICAL HISTORY:   Past Medical History  Diagnosis Date  . Hypertension   . Schizophrenia (HCC)   . Hyperlipemia   . Depression   . Diabetes (HCC)   . Liver disease   . Diabetes mellitus, type II (HCC)   . Heart disease   . Alcoholic cirrhosis of liver with ascites (HCC)   . Esophageal varices (HCC)   . Portal hypertensive gastropathy      PAST SURGICAL HISTORY:   Past Surgical History  Procedure Laterality Date  . No past surgeries    . Esophagogastroduodenoscopy (egd) with propofol N/A 08/03/2015    Procedure: ESOPHAGOGASTRODUODENOSCOPY (EGD) WITH PROPOFOL;  Surgeon: Elnita Maxwell, MD;  Location: [facility] ENDOSCOPY;  Service: Endoscopy;  Laterality: N/A;  . Esophagogastroduodenoscopy (egd) with propofol N/A 08/31/2015    Procedure: ESOPHAGOGASTRODUODENOSCOPY (EGD) WITH PROPOFOL;  Surgeon: Elnita Maxwell, MD;  Location: [facility] ENDOSCOPY;  Service: Endoscopy;  Laterality: N/A;  . Esophagogastroduodenoscopy N/A 10/05/2015    Procedure: ESOPHAGOGASTRODUODENOSCOPY (EGD);  Surgeon: Elnita Maxwell, MD;  Location: [facility] ENDOSCOPY;  Service: Endoscopy;  Laterality: N/A;    SOCIAL HISTORY:   Social History  Substance Use Topics  . Smoking status: Never Smoker   . Smokeless tobacco: Never Used  . Alcohol Use: No     Comment: last drank 8 days ago    FAMILY HISTORY:   Family History  Problem Relation Age of Onset  . Heart disease Mother   . Hypertension Mother   . Hyperlipidemia Mother   . Stroke Father   . Heart attack Father   . Hypertension Father   . Heart disease Father   . Alcohol abuse Father   . Heart disease Brother     DRUG ALLERGIES:  No Known Allergies  REVIEW OF SYSTEMS:   Review of Systems  Constitutional: Positive for malaise/fatigue. Negative for fever, chills and weight loss.  HENT: Negative for ear discharge, ear pain, hearing loss and nosebleeds.   Eyes: Negative for blurred vision, double vision and photophobia.  Respiratory: Negative for cough, hemoptysis, shortness of breath and wheezing.   Cardiovascular: Negative for chest pain, palpitations, orthopnea and leg swelling.  Gastrointestinal: Positive for nausea and abdominal pain. Negative for heartburn, vomiting, diarrhea, constipation and melena.  Genitourinary: Negative for dysuria, urgency, frequency and hematuria.   Musculoskeletal: Negative for myalgias, back pain and neck pain.  Skin: Negative for rash.  Neurological: Positive for dizziness and tremors. Negative for tingling, sensory change, speech change, focal weakness and headaches.  Endo/Heme/Allergies: Does not bruise/bleed easily.  Psychiatric/Behavioral: Negative for depression.    MEDICATIONS AT HOME:   Prior to Admission medications   Medication Sig Start Date End Date Taking? Authorizing Provider  albuterol (PROVENTIL HFA;VENTOLIN HFA) 108 (90 BASE) MCG/ACT inhaler Inhale 2 puffs into the lungs every 6 (six) hours as needed for wheezing or shortness of breath.    Yes Historical Provider, MD  aspirin EC 81 MG tablet Take 81 mg by mouth daily.    Yes Historical Provider, MD  citalopram (CELEXA) 20 MG tablet Take 1 tablet (20 mg total) by mouth daily. 10/16/15  Yes Audery Amel, MD  diphenhydrAMINE (BENADRYL) 25 mg capsule TAKE 2 CAPSULES BY MOUTH AT BEDTIME 10/03/15  Yes Audery Amel, MD  furosemide (LASIX) 20 MG tablet Take 20 mg by mouth 2 (two) times daily.   Yes Historical Provider, MD  lactulose (CHRONULAC) 10 GM/15ML solution Take 45 mLs by mouth every 6 (six) hours.   Yes Historical Provider, MD  losartan-hydrochlorothiazide (HYZAAR) 100-25 MG tablet Take 1 tablet by mouth daily.  08/06/15  Yes Historical Provider, MD  metFORMIN (GLUCOPHAGE) 500 MG tablet Take 1 tablet (500 mg total) by mouth 2 (two) times daily with a meal. 06/01/15  Yes Jolanta B Pucilowska, MD  metoCLOPramide (REGLAN) 10 MG tablet Take 1 tablet (10 mg total) by mouth 3 (three) times daily before meals. 11/05/15 11/04/16 Yes Rebecka Apley, MD  metoprolol succinate (TOPROL-XL) 100 MG 24 hr tablet Take 100 mg by mouth daily.  08/04/15  Yes Historical Provider, MD  Multiple Vitamin (MULTIVITAMIN WITH MINERALS) TABS tablet Take 1 tablet by mouth daily. 08/04/15  Yes Ramonita Lab, MD  OLANZapine (ZYPREXA) 15 MG tablet Take 1 tablet (15 mg total) by mouth at bedtime.  10/16/15  Yes Audery Amel, MD  omeprazole (PRILOSEC) 40 MG capsule Take 40 mg by mouth every morning.    Yes Historical Provider, MD  simvastatin (ZOCOR) 40 MG tablet Take 40 mg by mouth at bedtime.    Yes Historical Provider, MD      VITAL SIGNS:  Blood pressure 127/69, pulse 73, temperature 98.8 F (37.1 C), temperature source Oral, resp. rate 18, height  (1.905 m), weight 131.543 kg (290 lb), SpO2 98 %.  PHYSICAL EXAMINATION:   Physical Exam  GENERAL:  35 y.o.-year-old obese patient sitting in the bed with no acute distress.  EYES: Pupils equal, round, reactive to light and accommodation. No scleral icterus. Extraocular muscles intact.  HEENT: Head atraumatic, normocephalic. Oropharynx and nasopharynx clear.  NECK:  Supple, no jugular venous distention. No thyroid enlargement, no tenderness.  LUNGS: Normal breath sounds bilaterally, no wheezing, rales,rhonchi or crepitation. No use of accessory muscles of respiration. Increased bibasilar breath sounds CARDIOVASCULAR: S1, S2 normal. No murmurs, rubs, or gallops.  ABDOMEN: Soft, nontender, obese and distended. Bowel sounds present. No organomegaly or mass.  EXTREMITIES:  No pedal edema, cyanosis, or clubbing.  NEUROLOGIC: Cranial nerves II through XII are intact. Muscle strength 5/5 in all extremities. Sensation intact. Generalized weakness noted and unsteady gait. PSYCHIATRIC: The patient is alert and oriented x 3. Occasional pauses to think information. SKIN: No obvious rash, lesion, or ulcer.   LABORATORY PANEL:   CBC  Recent Labs Lab 11/07/15 1634  WBC 6.3  HGB 10.4*  HCT 30.8*  PLT 135*   ------------------------------------------------------------------------------------------------------------------  Chemistries   Recent Labs Lab 11/07/15 1634  NA 139  K 4.0  CL 108  CO2 23  GLUCOSE 116*  BUN 17  CREATININE 0.91  CALCIUM 9.4  AST 63*  ALT 61  ALKPHOS 117  BILITOT 0.8    ------------------------------------------------------------------------------------------------------------------  Cardiac Enzymes  Recent Labs Lab 11/05/15 0004  TROPONINI <0.03   ------------------------------------------------------------------------------------------------------------------  RADIOLOGY:  No results found.  EKG:   Orders placed or performed during the hospital encounter of 11/04/15  . ED EKG within 10 minutes  . ED EKG within 10 minutes  . EKG 12-Lead  . EKG 12-Lead  . EKG    IMPRESSION AND PLAN:   Howard [name]  is a 35 y.o. male with a known history of alcoholic liver cirrhosis, hypertension, schizophrenia, diabetes mellitus, COPD on 2 L oxygen, recent admission for hepatic encephalopathy last week presents again to the hospital secondary to worsening confusion and also tremors that started yesterday.  #1 hepatic encephalopathy-sees his last drink was 2 weeks ago prior to his last hospitalization. -Continue lactulose and also rifaximin. -GI consult for recurrent hepatic encephalopathy. -Physical therapy consult for weakness.  #2 alcoholic liver cirrhosis-has chronic thrombocytopenia which is at baseline. -No alcohol use for 2 weeks now -Continue Nadolol as history of esophageal varices that have been banded -Continue on Aldactone.  #3 hypertension-on losartan, hydrochlorothiazide and nadolol.  #4 diabetes mellitus-on metformin  #5 schizophrenia-seems to be pleasant at this time. Continue Zyprexa at bedtime Continue his Celexa for depression  #6 DVT prophylaxis-on Lovenox. Monitor hemoglobin and platelets.   Physical therapy has been consulted. Discussed with patient and also his mother at bedside   All the records are reviewed and case discussed with ED provider. Management plans discussed with the patient, family and they are in agreement.  CODE STATUS: Full code  TOTAL TIME TAKING CARE OF THIS PATIENT: 50 minutes.     Enid Baas M.D on 11/07/2015 at 7:06 PM  Between 7am to 6pm - Pager - [phone]  After 6pm go to [email or web address] - password [email or web address] [facility]  [place] [facility]  Office  [phone]  CC: Primary care physician; Imelda Pillow, NP

## 2015-11-07 NOTE — ED Notes (Signed)
Patient presents to the ED with tremors and confusion.  Patient states, "I think my liver is acting up.  I've got cirrhosis of the liver.  Every time I get confused I know my ammonia's building up."  Patient is alert and oriented x 4.  Patient has significant tremors when he puts his arms out straight.

## 2015-11-07 NOTE — ED Notes (Signed)
Triage charted under Toribio Harbour, EDT but performed by Loleta Dicker, RN

## 2015-11-07 NOTE — ED Provider Notes (Addendum)
[facility] Emergency Department Provider Note  ____________________________________________   I have reviewed the triage vital signs and the nursing notes.   HISTORY  Chief Complaint Nausea; Tremors; and Altered Mental Status    HPI Howard [name] is a 35 y.o. male presents today complaining of feeling confused since this afternoon. As well as increased tremors and nausea. He attributes this to what is likely elevated ammonia level. He states this is how he always feels with his ammonia levels up. He does have liver disease. He has a history of varices but he has not vomited, has had no hematemesis or bright red blood per rectum. He's had no melena. He's had no closed head injury. He denies fever or chills. He states he is getting gradually more confused as of today. He is compliant he stays with his lactulose. He states he took it regularly as scheduled 3 times a day 45 g. He states he has had no alcohol for the last 11 days. This is a longer stretch than normal for him.  Past Medical History  Diagnosis Date  . Hypertension   . Schizophrenia (HCC)   . Hyperlipemia   . Depression   . Diabetes (HCC)   . Liver disease   . Diabetes mellitus, type II (HCC)   . Heart disease   . Alcoholic cirrhosis of liver with ascites (HCC)   . Esophageal varices (HCC)   . Portal hypertensive gastropathy     Patient Active Problem List   Diagnosis Date Noted  . Hepatic encephalopathy (HCC) 10/16/2015  . Type 2 diabetes mellitus (HCC) 10/16/2015  . Acute renal failure (ARF) (HCC) 07/30/2015  . 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 Hypoxia 07/04/2015  . Elevated transaminase level 07/04/2015  . DOE (dyspnea on exertion) 07/04/2015  . Alcohol abuse 05/31/2015  . Hypertension 05/29/2015  . Paranoid schizophrenia (HCC) 03/20/2015    Past Surgical History  Procedure Laterality Date  . No past surgeries    . Esophagogastroduodenoscopy  (egd) with propofol N/A 08/03/2015    Procedure: ESOPHAGOGASTRODUODENOSCOPY (EGD) WITH PROPOFOL;  Surgeon: Elnita Maxwell, MD;  Location: [facility] ENDOSCOPY;  Service: Endoscopy;  Laterality: N/A;  . Esophagogastroduodenoscopy (egd) with propofol N/A 08/31/2015    Procedure: ESOPHAGOGASTRODUODENOSCOPY (EGD) WITH PROPOFOL;  Surgeon: Elnita Maxwell, MD;  Location: [facility] ENDOSCOPY;  Service: Endoscopy;  Laterality: N/A;  . Esophagogastroduodenoscopy N/A 10/05/2015    Procedure: ESOPHAGOGASTRODUODENOSCOPY (EGD);  Surgeon: Elnita Maxwell, MD;  Location: [facility] ENDOSCOPY;  Service: Endoscopy;  Laterality: N/A;    No current outpatient prescriptions on file.  Allergies Review of patient's allergies indicates no known allergies.  Family History  Problem Relation Age of Onset  . Heart disease Mother   . Hypertension Mother   . Hyperlipidemia Mother   . Stroke Father   . Heart attack Father   . Hypertension Father   . Heart disease Father   . Alcohol abuse Father   . Heart disease Brother     Social History Social History  Substance Use Topics  . Smoking status: Never Smoker   . Smokeless tobacco: Never Used  . Alcohol Use: No     Comment: last drank 8 days ago    Review of Systems Constitutional: No fever/chills Eyes: No visual changes. ENT: No sore throat. No stiff neck no neck pain Cardiovascular: Denies chest pain. Respiratory: Denies shortness of breath. Gastrointestinal:   no vomiting.  No diarrhea.  No constipation. Genitourinary: Negative for dysuria.  Musculoskeletal: Negative lower extremity swelling Skin: Negative for rash. Neurological: Negative for headaches, focal weakness or numbness. 10-point ROS otherwise negative.  ____________________________________________   PHYSICAL EXAM:  VITAL SIGNS: ED Triage Vitals  Enc Vitals Group     BP 11/07/15 1615 127/69 mmHg     Pulse Rate 11/07/15 1615 73     Resp 11/07/15 1615 18     Temp 11/07/15 1615 98.8 F  (37.1 C)     Temp Source 11/07/15 1615 Oral     SpO2 11/07/15 1615 98 %     Weight 11/07/15 1615 290 lb (131.543 kg)     Height 11/07/15 1615 6\' 3"  (1.905 m)     Head Cir --      Peak Flow --      Pain Score 11/07/15 1615 0     Pain Loc --      Pain Edu? --      Excl. in GC? --     Constitutional: Alert and oriented. Well appearing and in no acute distress. Eyes: Conjunctivae are normal. PERRL. EOMI. Head: Atraumatic. Nose: No congestion/rhinnorhea. Mouth/Throat: Mucous membranes are moist.  Oropharynx non-erythematous. Neck: No stridor.   Nontender with no meningismus Cardiovascular: Normal rate, regular rhythm. Grossly normal heart sounds.  Good peripheral circulation. Respiratory: Normal respiratory effort.  No retractions. Lungs CTAB. Abdominal: Soft and nontender. No distention. No guarding no rebound Back:  There is no focal tenderness or step off there is no midline tenderness there are no lesions noted. there is no CVA tenderness Musculoskeletal: No lower extremity tenderness. No joint effusions, no DVT signs strong distal pulses no edema Neurologic:  Normal speech and language. No gross focal neurologic deficits are appreciated. Positive asterixis Skin:  Skin is warm, dry and intact. No rash noted. Psychiatric: Mood and affect are normal. Speech and behavior are normal.  ____________________________________________   LABS (all labs ordered are listed, but only abnormal results are displayed)  Labs Reviewed  COMPREHENSIVE METABOLIC PANEL - Abnormal; Notable for the following:    Glucose, Bld 116 (*)    AST 63 (*)    All other components within normal limits  CBC - Abnormal; Notable for the following:    RBC 3.26 (*)    Hemoglobin 10.4 (*)    HCT 30.8 (*)    RDW 15.1 (*)    Platelets 135 (*)    All other components within normal limits  AMMONIA - Abnormal; Notable for the following:    Ammonia 157 (*)    All other components within normal limits  LIPASE, BLOOD   AMMONIA  BASIC METABOLIC PANEL  CBC   ____________________________________________  EKG  I personally interpreted any EKGs ordered by me or triage  ____________________________________________  RADIOLOGY  I reviewed any imaging ordered by me or triage that were performed during my shift ____________________________________________   PROCEDURES  Procedure(s) performed: None  Critical Care performed: None  ____________________________________________   INITIAL IMPRESSION / ASSESSMENT AND PLAN / ED COURSE  Pertinent labs & imaging results that were available during my care of the patient were reviewed by me and considered in my medical decision making (see chart for details).  Patient presents with what he describes as confusion and nausea and increased asterixis which is what he feels when his ammonia level is up. He is awake and alert at this time apparently he has had waxing and waning confusion during the day. His ammonia is quite elevated at 157. This is a chronic recurrent issue for  this patient. No evidence of acute withdrawal medically at this time although his asterixis could be a tremor it certainly does not appear consistent with that and is been 11 days since he last night. ____________________________________________   FINAL CLINICAL IMPRESSION(S) / ED DIAGNOSES  Final diagnoses:  Hepatic encephalopathy (HCC)      This chart was dictated using voice recognition software.  Despite best efforts to proofread,  errors can occur which can change meaning.    Jeanmarie Plant, MD 11/07/15 4098  Jeanmarie Plant, MD 11/07/15 2227

## 2015-11-07 NOTE — ED Notes (Signed)
Pt states hx of liver chirrosis, pt on 2L [place], states confusion this AM, pt takes lactulose but states he does not feel any better, pt has tremors present, pt able to answer questions appropiately

## 2015-11-08 LAB — GLUCOSE, CAPILLARY: Glucose-Capillary: 96 mg/dL (ref 65–99)

## 2015-11-08 LAB — BASIC METABOLIC PANEL
Anion gap: 8 (ref 5–15)
BUN: 17 mg/dL (ref 6–20)
CHLORIDE: 109 mmol/L (ref 101–111)
CO2: 24 mmol/L (ref 22–32)
Calcium: 9.2 mg/dL (ref 8.9–10.3)
Creatinine, Ser: 0.84 mg/dL (ref 0.61–1.24)
GFR calc non Af Amer: >60 mL/min (ref >60)
Glucose, Bld: 79 mg/dL (ref 65–99)
POTASSIUM: 3.7 mmol/L (ref 3.5–5.1)
SODIUM: 141 mmol/L (ref 135–145)

## 2015-11-08 LAB — CBC
HEMATOCRIT: 28.4 % — AB (ref 40.0–52.0)
Hemoglobin: 9.6 g/dL — ABNORMAL LOW (ref 13.0–18.0)
MCH: 31.5 pg (ref 26.0–34.0)
MCHC: 33.9 g/dL (ref 32.0–36.0)
MCV: 93.1 fL (ref 80.0–100.0)
PLATELETS: 119 10*3/uL — AB (ref 150–440)
RBC: 3.05 MIL/uL — AB (ref 4.40–5.90)
RDW: 15.1 % — ABNORMAL HIGH (ref 11.5–14.5)
WBC: 5.4 10*3/uL (ref 3.8–10.6)

## 2015-11-08 LAB — AMMONIA: Ammonia: 95 umol/L — ABNORMAL HIGH (ref 9–35)

## 2015-11-08 NOTE — Progress Notes (Signed)
Initial Nutrition Assessment      INTERVENTION:  Meals and snacks: Cater to pt preferences. Pt would benefit from adding carb modified to current diet order with history of DM    NUTRITION DIAGNOSIS:   Inadequate oral intake related to acute illness as evidenced by per patient/family report.    GOAL:   Patient will meet greater than or equal to 90% of their needs    MONITOR:    (Energy intake, Digestive system)  REASON FOR ASSESSMENT:   Diagnosis    ASSESSMENT:      Pt admitted with hepatic encephalopathy and elevated ammonia  Past Medical History  Diagnosis Date  . Hypertension   . Schizophrenia (HCC)   . Hyperlipemia   . Depression   . Diabetes (HCC)   . Liver disease   . Diabetes mellitus, type II (HCC)   . Heart disease   . Alcoholic cirrhosis of liver with ascites (HCC)   . Esophageal varices (HCC)   . Portal hypertensive gastropathy     Current Nutrition: eating 50-100% of meals and tolerating well  Food/Nutrition-Related History: pt reports decreased intake for about 1 week prior to admission secondary to not feeling well and nauseated   Scheduled Medications:  . citalopram  20 mg Oral Daily  . enoxaparin (LOVENOX) injection  40 mg Subcutaneous Q24H  . losartan  100 mg Oral Daily   And  . hydrochlorothiazide  25 mg Oral Daily  . lactulose  30 g Oral Q6H  . metFORMIN  500 mg Oral BID WC  . metoCLOPramide  10 mg Oral TID AC  . multivitamin with minerals  1 tablet Oral Daily  . nadolol  80 mg Oral Daily  . OLANZapine  15 mg Oral QHS  . pantoprazole  40 mg Oral Daily  . rifaximin  550 mg Oral BID  . simvastatin  40 mg Oral QHS  . sodium chloride flush  3 mL Intravenous Q12H  . spironolactone  100 mg Oral Daily        Electrolyte/Renal Profile and Glucose Profile:   Recent Labs Lab 11/04/15 1925 11/07/15 1634 11/08/15 0528  NA 141 139 141  K 3.9 4.0 3.7  CL 113* 108 109  CO2 21* 23 24  BUN 22* 17 17  CREATININE 1.30* 0.91  0.84  CALCIUM 9.0 9.4 9.2  GLUCOSE 137* 116* 79   Protein Profile:  Recent Labs Lab 11/07/15 1634  ALBUMIN 3.7    Gastrointestinal Profile: Last BM:2/15   Nutrition-Focused Physical Exam Findings: Nutrition-Focused physical exam completed. Findings are WDL for fat depletion, muscle depletion, and edema.     Weight Change: pt reports wt gain    Diet Order:  Diet 2 gram sodium Room service appropriate?: Yes; Fluid consistency:: Thin  Skin:   reviewed   Height:   Ht Readings from Last 1 Encounters:  11/07/15  (1.905 m)    Weight:   Wt Readings from Last 1 Encounters:  11/07/15 283 lb 8 oz (128.595 kg)    Ideal Body Weight:     BMI:  Body mass index is 35.44 kg/(m^2).   EDUCATION NEEDS:   No education needs identified at this time  LOW Care Level  Jenesa Foresta B. Freida Busman, RD, LDN [phone] (pager) Weekend/On-Call pager [phone])

## 2015-11-08 NOTE — Consult Note (Signed)
Consultation  Referring Provider: Dr. Nemiah Commander  Primary Care Physician:  Imelda Pillow, NP Consulting  Gastroenterologist: Dr. Lynnae Prude         Reason for Consultation: hepatic encephalopathy         HPI:   Howard [name] is a 35 yo with a known history of alcoholic liver cirrhosis, esophageal varices (banded 08/03/15 and 08/31/15), HTN, schizophrenia, DM 2, COPD on 2 L oxygen. He had a recent admission for hepatic encephalopathy on 10/27/14; present to the [facility] ED again on 11/07/15 secondary to worsening confusion, nausea, nystagmus and tremors that started 2/15 around 13:00.  He is currently on lactulose 45 mL 3x/day and is having 1-2 formed BMs/day.  He is supposed to be on Rifaximin and his mother does not know his medications by name. She said that she brought them all in yesterday, but this med is not listed on admission.   He reports that he stopped drinking alcohol 11 days ago. Prior to that, he was drinking a liter of liquor a day until October, then cut back to 2 40oz beers a day until 11 days ago.  He denies any alcohol use prior to this ED visit. His mother, who he lives with, was in the room and supported this statement.  He denies vomiting or melena.  He had 1 episode of seeing bright red blood on the tissue after having a BM 1 week ago.  He states he is up 14 pounds over the last few weeks, but denies any feeling of abdominal distention.  The 2-3 days prior to his ED visit, his energy level was very low.  He denies CP or SOB. His GERD is well controlled on Prilosec. He denies diarrhea, constipation, and dysphagia.  He takes a 81mg  ASA a day, but denies any NSAID use.  He is not on blood thinners.  Seen by the PA student and Fransico Setters NP  Paris Lore. Howell-Methvin, MT (ASCP)CM ?PA-S M.S. Physician Assistant Studies [place], 2017 [email or web address] Phone: [phone]  Past Medical History  Diagnosis Date  . Hypertension   . Schizophrenia (HCC)   . Hyperlipemia   .  Depression   . Diabetes (HCC)   . Liver disease   . Diabetes mellitus, type II (HCC)   . Heart disease   . Alcoholic cirrhosis of liver with ascites (HCC)   . Esophageal varices (HCC)   . Portal hypertensive gastropathy     Past Surgical History  Procedure Laterality Date  . No past surgeries    . Esophagogastroduodenoscopy (egd) with propofol N/A 08/03/2015    Procedure: ESOPHAGOGASTRODUODENOSCOPY (EGD) WITH PROPOFOL;  Surgeon: Elnita Maxwell, MD;  Location: [facility] ENDOSCOPY;  Service: Endoscopy;  Laterality: N/A;  . Esophagogastroduodenoscopy (egd) with propofol N/A 08/31/2015    Procedure: ESOPHAGOGASTRODUODENOSCOPY (EGD) WITH PROPOFOL;  Surgeon: Elnita Maxwell, MD;  Location: [facility] ENDOSCOPY;  Service: Endoscopy;  Laterality: N/A;  . Esophagogastroduodenoscopy N/A 10/05/2015    Procedure: ESOPHAGOGASTRODUODENOSCOPY (EGD);  Surgeon: Elnita Maxwell, MD;  Location[facility] ENDOSCOPY;  Service: Endoscopy;  Laterality: N/A;    Family History  Problem Relation Age of Onset  . Heart disease Mother   . Hypertension Mother   . Hyperlipidemia Mother   . Stroke Father   . Heart attack Father   . Hypertension Father   . Heart disease Father   . Alcohol abuse Father   . Heart disease Brother      Social History  Substance Use Topics  . Smoking status: Never Smoker   .  Smokeless tobacco: Never Used  . Alcohol Use: No     Comment: last drank 8 days ago    Prior to Admission medications   Medication Sig Start Date End Date Taking? Authorizing Provider  albuterol (PROVENTIL HFA;VENTOLIN HFA) 108 (90 BASE) MCG/ACT inhaler Inhale 2 puffs into the lungs every 6 (six) hours as needed for wheezing or shortness of breath.    Yes Historical Provider, MD  aspirin EC 81 MG tablet Take 81 mg by mouth daily.    Yes Historical Provider, MD  citalopram (CELEXA) 20 MG tablet Take 1 tablet (20 mg total) by mouth daily. 10/16/15  Yes Audery Amel, MD  diphenhydrAMINE (BENADRYL) 25 mg  capsule TAKE 2 CAPSULES BY MOUTH AT BEDTIME 10/03/15  Yes Audery Amel, MD  furosemide (LASIX) 20 MG tablet Take 20 mg by mouth 2 (two) times daily.   Yes Historical Provider, MD  lactulose (CHRONULAC) 10 GM/15ML solution Take 45 mLs by mouth every 6 (six) hours.   Yes Historical Provider, MD  losartan-hydrochlorothiazide (HYZAAR) 100-25 MG tablet Take 1 tablet by mouth daily.  08/06/15  Yes Historical Provider, MD  metFORMIN (GLUCOPHAGE) 500 MG tablet Take 1 tablet (500 mg total) by mouth 2 (two) times daily with a meal. 06/01/15  Yes Jolanta B Pucilowska, MD  metoCLOPramide (REGLAN) 10 MG tablet Take 1 tablet (10 mg total) by mouth 3 (three) times daily before meals. 11/05/15 11/04/16 Yes Rebecka Apley, MD  metoprolol succinate (TOPROL-XL) 100 MG 24 hr tablet Take 100 mg by mouth daily.  08/04/15  Yes Historical Provider, MD  Multiple Vitamin (MULTIVITAMIN WITH MINERALS) TABS tablet Take 1 tablet by mouth daily. 08/04/15  Yes Ramonita Lab, MD  OLANZapine (ZYPREXA) 15 MG tablet Take 1 tablet (15 mg total) by mouth at bedtime. 10/16/15  Yes Audery Amel, MD  omeprazole (PRILOSEC) 40 MG capsule Take 40 mg by mouth every morning.    Yes Historical Provider, MD  simvastatin (ZOCOR) 40 MG tablet Take 40 mg by mouth at bedtime.    Yes Historical Provider, MD    Current Facility-Administered Medications  Medication Dose Route Frequency Provider Last Rate Last Dose  . acetaminophen (TYLENOL) tablet 650 mg  650 mg Oral Q6H PRN Enid Baas, MD       Or  . acetaminophen (TYLENOL) suppository 650 mg  650 mg Rectal Q6H PRN Enid Baas, MD      . citalopram (CELEXA) tablet 20 mg  20 mg Oral Daily Enid Baas, MD   20 mg at 11/08/15 1114  . enoxaparin (LOVENOX) injection 40 mg  40 mg Subcutaneous Q24H Enid Baas, MD   40 mg at 11/07/15 2228  . losartan (COZAAR) tablet 100 mg  100 mg Oral Daily Auburn Bilberry, MD   100 mg at 11/08/15 1114   And  . hydrochlorothiazide (HYDRODIURIL)  tablet 25 mg  25 mg Oral Daily Auburn Bilberry, MD   25 mg at 11/08/15 1115  . lactulose (CHRONULAC) 10 GM/15ML solution 30 g  30 g Oral Q6H Enid Baas, MD   30 g at 11/08/15 1116  . metFORMIN (GLUCOPHAGE) tablet 500 mg  500 mg Oral BID WC Enid Baas, MD   500 mg at 11/08/15 1124  . metoCLOPramide (REGLAN) tablet 10 mg  10 mg Oral TID AC Enid Baas, MD   10 mg at 11/08/15 1116  . multivitamin with minerals tablet 1 tablet  1 tablet Oral Daily Enid Baas, MD   1 tablet at 11/08/15 1114  .  nadolol (CORGARD) tablet 80 mg  80 mg Oral Daily Enid Baas, MD   80 mg at 11/08/15 1115  . OLANZapine (ZYPREXA) tablet 15 mg  15 mg Oral QHS Enid Baas, MD   15 mg at 11/07/15 2226  . ondansetron (ZOFRAN) tablet 4 mg  4 mg Oral Q6H PRN Enid Baas, MD       Or  . ondansetron (ZOFRAN) injection 4 mg  4 mg Intravenous Q6H PRN Enid Baas, MD      . pantoprazole (PROTONIX) EC tablet 40 mg  40 mg Oral Daily Enid Baas, MD   40 mg at 11/08/15 1115  . rifaximin (XIFAXAN) tablet 550 mg  550 mg Oral BID Enid Baas, MD   550 mg at 11/08/15 1114  . simvastatin (ZOCOR) tablet 40 mg  40 mg Oral QHS Enid Baas, MD   40 mg at 11/07/15 2226  . sodium chloride flush (NS) 0.9 % injection 3 mL  3 mL Intravenous Q12H Enid Baas, MD   3 mL at 11/07/15 2233  . spironolactone (ALDACTONE) tablet 100 mg  100 mg Oral Daily Enid Baas, MD   100 mg at 11/08/15 1114    Allergies as of 11/07/2015  . (No Known Allergies)    Review of Systems:    A 12 system review was obtained and he has a skin condition and has been scratching his lower right leg- serous drainage. Otherwise negative except where noted in HPI.    Physical Exam:  Vital signs in last 24 hours: Temp:  [98.3 F (36.8 C)-98.8 F (37.1 C)] 98.3 F (36.8 C) (02/16 1321) Pulse Rate:  [58-86] 72 (02/16 1321) Resp:  [18-20] 20 (02/16 1321) BP: (114-134)/(56-77) 114/60 mmHg (02/16  1321) SpO2:  [96 %-100 %] 96 % (02/16 1321) Weight:  [128.595 kg (283 lb 8 oz)-131.543 kg (290 lb)] 128.595 kg (283 lb 8 oz) (02/15 2203) Last BM Date: 11/07/15  General:  Well-developed, well-nourished and in no acute distress Head:  Head without obvious abnormality, atraumatic  Eyes:   Conjunctiva pink, sclera anicteric   ENT:   Mouth free of lesions, mucosa moist, tongue pink, no thrush noted, teeth and gums normal Neck:   Supple w/o thyromegaly or mass, trachea midline, no adenopathy  Lungs: Clear to auscultation bilaterally, respirations unlabored Heart:     Normal S1S2, no rubs, murmurs, gallops. Abdomen: Soft, non-tender, no hepatosplenomegaly, hernia, or mass and BS normal, no ascites Rectal: Deferred Lymph:  No cervical or supraclavicular adenopathy. Extremities:  Slight ankle edema Skin  Skin color sallow,  right lower ankle with excoriation and serous drainage.  Neuro:  A&O x 3. CNII-XII intact, normal strength Psych:  Appropriate mood and affect.  Data Reviewed:  LAB RESULTS:  Recent Labs  11/07/15 1634 11/08/15 0528  WBC 6.3 5.4  HGB 10.4* 9.6*  HCT 30.8* 28.4*  PLT 135* 119*   BMET  Recent Labs  11/07/15 1634 11/08/15 0528  NA 139 141  K 4.0 3.7  CL 108 109  CO2 23 24  GLUCOSE 116* 79  BUN 17 17  CREATININE 0.91 0.84  CALCIUM 9.4 9.2   LFT  Recent Labs  11/07/15 1634  PROT 7.3  ALBUMIN 3.7  AST 63*  ALT 61  ALKPHOS 117  BILITOT 0.8   Ammonia today 95 from 157 and 183 PT/INR No results for input(s): LABPROT, INR in the last 72 hours.  STUDIES: No results found.   Assessment:  Howard [name] is a 35 y.o. with  a known history of alcoholic liver cirrhosis, esophageal varices (banded 08/03/15 and 08/31/15), HTN, schizophrenia, DM 2, COPD on 2 L oxygen. He comes in with elevated ammonia level on lactulose alone.   Plan:  He needs home health visits to set him up on the correct medication. He should do better with addition of Xifaxan. He  was encouraged to go to [facility] and get help stopping alcohol.     This case was discussed with Dr. Scot Jun in collaboration of care. Thank you for the consultation.  These services provided by Amedeo Kinsman RN, MSM, ANP-BC under collaborative practice agreement with Scot Jun, MD.  11/08/2015, 1:23 PM

## 2015-11-08 NOTE — Evaluation (Signed)
Physical Therapy Evaluation Patient Details Name: BLANCA THORNTON MRN: [id number] DOB: 12-03-1980 Today's Date: 11/08/2015   History of Present Illness  presented to ER secondary to AMS, tremors; admitted with elevated ammonia consistent with hepatic encephalopathy.  Of note, patient with recent admission approx 10 days prior with similar diagnosis/course.  Clinical Impression  Upon evaluation, patient alert and oriented to basic information; follows simple commands and demonstrates fair insight/safety awareness.  Bilat UE/LE strength and ROM grossly WFL; denies pain at this time.  Able to complete bed mobility with mod indep; sit/stand, basic transfers and gait (220') with RW, cga/min (except R lateral LOB x1 requiring min/mod assist from therapist for correction).  Gait speed decreased for age-matched norms, and requires increased time for 5x sit/stand, both measures indicative of increased fall risk with functional activities.  Do recommend continued use of RW with all mobility at this time for optimal safety/indep.  Patient/mother voiced agreement and understanding (has RW at home). Would benefit from skilled PT to address above deficits and promote optimal return to PLOF; Recommend transition to HHPT upon discharge from acute hospitalization.     Follow Up Recommendations Home health PT;Supervision - Intermittent    Equipment Recommendations       Recommendations for Other Services       Precautions / Restrictions Precautions Precautions: Fall Restrictions Weight Bearing Restrictions: No      Mobility  Bed Mobility Overal bed mobility: Modified Independent                Transfers Overall transfer level: Needs assistance Equipment used: Rolling walker (2 wheeled) Transfers: Sit to/from Stand Sit to Stand: Min guard            Ambulation/Gait Ambulation/Gait assistance: Min assist;Mod assist Ambulation Distance (Feet): 225 Feet Assistive device: Rolling walker  (2 wheeled)   Gait velocity: 10' walk time, 7 seconds. Gait velocity interpretation: <1.8 ft/sec, indicative of risk for recurrent falls General Gait Details: reciprocal stepping pattern with fair step height/length; mild sway to R at times, worsened with dynamic gait components.  Single R lateral LOB x1 requiring min/mod assist from therapist for correction. Vitals stable and WFL throughout.  Stairs            Wheelchair Mobility    Modified Rankin (Stroke Patients Only)       Balance Overall balance assessment: Needs assistance Sitting-balance support: No upper extremity supported;Feet supported Sitting balance-Leahy Scale: Good     Standing balance support: Bilateral upper extremity supported Standing balance-Leahy Scale: Fair                               Pertinent Vitals/Pain Pain Assessment: No/denies pain    Home Living Family/patient expects to be discharged to:: Private residence Living Arrangements: Parent;Other relatives Available Help at Discharge: Family;Available 24 hours/day Type of Home: House Home Access: Stairs to enter Entrance Stairs-Rails: Can reach both Entrance Stairs-Number of Steps: 4 Home Layout: One level Home Equipment: Walker - 2 wheels;Cane - single point;Bedside commode      Prior Function           Comments: Pt ambulating household distances with SPC vs. RW. Mom and brother assist him with ADLs as needed.  Denies recent fall history, but does endorse several instances where "the walls catch me"     Hand Dominance        Extremity/Trunk Assessment   Upper Extremity Assessment: Overall WFL for tasks  assessed           Lower Extremity Assessment: Overall WFL for tasks assessed (grossly 4+/5 throughout bilat LEs)         Communication   Communication: No difficulties  Cognition Arousal/Alertness: Awake/alert Behavior During Therapy: WFL for tasks assessed/performed Overall Cognitive Status: History of  cognitive impairments - at baseline                      General Comments      Exercises Other Exercises Other Exercises: 5x sit/stand without assist device, cga/close sup: 27 seconds.  Requires UE support to initiate/complete lift off with each repetion; decreased speed/power noted bilat LEs indicative of increase fall risk with functional activities. Other Exercises: Recommend continued use of RW at this time due to functional endurance deficits and higher-level balance deficits; patient/mother voiced understanding (has RW at home).      Assessment/Plan    PT Assessment Patient needs continued PT services  PT Diagnosis Difficulty walking;Generalized weakness   PT Problem List Decreased activity tolerance;Decreased balance;Decreased mobility;Decreased safety awareness;Decreased cognition;Decreased knowledge of use of DME  PT Treatment Interventions DME instruction;Gait training;Stair training;Functional mobility training;Therapeutic activities;Therapeutic exercise;Balance training;Neuromuscular re-education;Cognitive remediation   PT Goals (Current goals can be found in the Care Plan section) Acute Rehab PT Goals Patient Stated Goal: to get back home PT Goal Formulation: With patient Time For Goal Achievement: 11/22/15 Potential to Achieve Goals: Good    Frequency Min 2X/week   Barriers to discharge Decreased caregiver support      Co-evaluation               End of Session Equipment Utilized During Treatment: Gait belt;Oxygen Activity Tolerance: Patient tolerated treatment well Patient left: in bed;with call bell/phone within reach;with bed alarm set;with family/visitor present Nurse Communication: Mobility status         Time: 4098-1191 PT Time Calculation (min) (ACUTE ONLY): 16 min   Charges:   PT Evaluation $PT Eval Low Complexity: 1 Procedure PT Treatments $Therapeutic Activity: 8-22 mins   PT G Codes:        Bernetha Anschutz H. Manson Passey, PT, DPT,  NCS 11/08/2015, 9:55 AM [phone]

## 2015-11-08 NOTE — Care Management (Signed)
Admitted to [facility] with the diagnosis of Hepatic Encephalopathy. Lives with mother Veto Macqueen [phone]). Last seen Imelda Pillow NP yesterday. Chronic oxygen thru [facility]. Oxygen was thru [facility]. Home Health services thru [facility] in the past. No skilled facility. "Fell yesterday, but wall caught him." Appetite no too good. Takes care of all basic activities of daily living himself. States he has 2 cars and can ride, but doesn't. Uses a cane to aid in ambulation. Rolling walker is in the home, if needed. States he has enrolling in [facility], just waiting on phone call. They are suppose to call Friday. Mother will transport. Serena Croissant Zakyia Gagan RN MSN CCM Care Management [phone]

## 2015-11-08 NOTE — Progress Notes (Signed)
[facility] [place] at [facility]                                                                                                                                                                                            Patient Demographics   Howard [name], is a 35 y.o. male, DOB - [birth year], QMV:[id number]  Admit date - 11/07/2015   Admitting Physician Enid Baas, MD  Outpatient Primary MD for the patient is HOLLAND, CHELSA, NP   LOS - 1  Subjective: Patient admitted with confusion and acute encephalopathy due to hepatic encephalopathy. This morning he is more awake and alert. Ammonia level still elevated.     Review of Systems:   CONSTITUTIONAL: No documented fever. Positive fatigue and weakness. No weight gain, no weight loss.  EYES: No blurry or double vision.  ENT: No tinnitus. No postnasal drip. No redness of the oropharynx.  RESPIRATORY: No cough, no wheeze, no hemoptysis. No dyspnea.  CARDIOVASCULAR: No chest pain. No orthopnea. No palpitations. No syncope.  GASTROINTESTINAL: No nausea, no vomiting or diarrhea. No abdominal pain. No melena or hematochezia.  GENITOURINARY: No dysuria or hematuria.  ENDOCRINE: No polyuria or nocturia. No heat or cold intolerance.  HEMATOLOGY: No anemia. No bruising. No bleeding.  INTEGUMENTARY: No rashes. No lesions.  MUSCULOSKELETAL: No arthritis. No swelling. No gout.  NEUROLOGIC: No numbness, tingling, or ataxia. No seizure-type activity.  PSYCHIATRIC: No anxiety. No insomnia. No ADD.    Vitals:   Filed Vitals:   11/07/15 2203 11/08/15 0528 11/08/15 0945 11/08/15 1102  BP: 120/63 134/63  116/56  Pulse: 58 72 86 72  Temp: 98.3 F (36.8 C) 98.4 F (36.9 C)    TempSrc: Oral Oral    Resp:  18    Height:  (1.905 m)     Weight: 128.595 kg (283 lb 8 oz)     SpO2: 99% 97% 96%     Wt Readings from Last 3 Encounters:  11/07/15 128.595 kg (283 lb 8 oz)  11/04/15 128.822 kg (284 lb)  10/28/15  128.822 kg (284 lb)     Intake/Output Summary (Last 24 hours) at 11/08/15 1314 Last data filed at 11/08/15 0453  Gross per 24 hour  Intake      0 ml  Output    400 ml  Net   -400 ml    Physical Exam:   GENERAL: Pleasant-appearing in no apparent distress.  HEAD, EYES, EARS, NOSE AND THROAT: Atraumatic, normocephalic. Extraocular muscles are intact. Pupils equal and reactive to light. Sclerae anicteric. No conjunctival injection. No oro-pharyngeal erythema.  NECK: Supple. There is no  jugular venous distention. No bruits, no lymphadenopathy, no thyromegaly.  HEART: Regular rate and rhythm,. No murmurs, no rubs, no clicks.  LUNGS: Clear to auscultation bilaterally. No rales or rhonchi. No wheezes.  ABDOMEN: Soft, flat, nontender, nondistended. Has good bowel sounds. No hepatosplenomegaly appreciated.  EXTREMITIES: No evidence of any cyanosis, clubbing, or peripheral edema.  +2 pedal and radial pulses bilaterally.  NEUROLOGIC: The patient is alert, awake, and oriented x3 with no focal motor or sensory deficits appreciated bilaterally.  SKIN: Moist and warm with no rashes appreciated.  Psych: Not anxious, depressed LN: No inguinal LN enlargement    Antibiotics   Anti-infectives    Start     Dose/Rate Route Frequency Ordered Stop   11/07/15 2215  rifaximin (XIFAXAN) tablet 550 mg     550 mg Oral 2 times daily 11/07/15 2202        Medications   Scheduled Meds: . citalopram  20 mg Oral Daily  . enoxaparin (LOVENOX) injection  40 mg Subcutaneous Q24H  . losartan  100 mg Oral Daily   And  . hydrochlorothiazide  25 mg Oral Daily  . lactulose  30 g Oral Q6H  . metFORMIN  500 mg Oral BID WC  . metoCLOPramide  10 mg Oral TID AC  . multivitamin with minerals  1 tablet Oral Daily  . nadolol  80 mg Oral Daily  . OLANZapine  15 mg Oral QHS  . pantoprazole  40 mg Oral Daily  . rifaximin  550 mg Oral BID  . simvastatin  40 mg Oral QHS  . sodium chloride flush  3 mL Intravenous Q12H   . spironolactone  100 mg Oral Daily   Continuous Infusions:  PRN Meds:.acetaminophen **OR** acetaminophen, ondansetron **OR** ondansetron (ZOFRAN) IV   Data Review:   Micro Results No results found for this or any previous visit (from the past 240 hour(s)).  Radiology Reports Dg Chest 2 View  11/04/2015  CLINICAL DATA:  35 year old presenting with acute onset of right-sided chest pain at approximately 2 o'clock p.m. this afternoon. Current history of hypertension, diabetes and alcoholic cirrhosis. EXAM: CHEST  2 VIEW COMPARISON:  10/28/2015 and earlier, including CTA chest 07/05/2015. FINDINGS: Cardiomediastinal silhouette unremarkable, unchanged. Mildly prominent bronchovascular markings diffusely and mild central peribronchial thickening, unchanged over multiple examinations. Linear scarring in the lingula, unchanged. Lungs otherwise clear. No localized airspace consolidation. No pleural effusions. No pneumothorax. Normal pulmonary vascularity. Mild degenerative changes involving the thoracic spine. IMPRESSION: Stable lingular scarring and mild changes of chronic bronchitis and/or asthma. No acute cardiopulmonary disease. Electronically Signed   By: Hulan Saas M.D.   On: 11/04/2015 21:46   Dg Chest 2 View  10/28/2015  CLINICAL DATA:  Chest pain that started today. Known cirrhosis. Uses O2 at home, but denies lung disease. EXAM: CHEST  2 VIEW COMPARISON:  10/16/2015 FINDINGS: The heart size and mediastinal contours are within normal limits. Both lungs are clear. The visualized skeletal structures are unremarkable. IMPRESSION: No active cardiopulmonary disease. Electronically Signed   By: Norva Pavlov M.D.   On: 10/28/2015 15:49   Dg Chest Portable 1 View  10/16/2015  CLINICAL DATA:  35 year old male with altered mental status, dizziness, liver disease and diabetes. EXAM: PORTABLE CHEST 1 VIEW COMPARISON:  07/30/2015 and prior exams FINDINGS: The cardiomediastinal silhouette is  unremarkable. There is no evidence of focal airspace disease, pulmonary edema, suspicious pulmonary nodule/mass, pleural effusion, or pneumothorax. No acute bony abnormalities are identified. IMPRESSION: No active disease. Electronically Signed   By:  Harmon Pier M.D.   On: 10/16/2015 20:48   Ct Renal Stone Study  11/05/2015  CLINICAL DATA:  35 year old male chest with fracture pain and decreased urine output. EXAM: CT ABDOMEN AND PELVIS WITHOUT CONTRAST TECHNIQUE: Multidetector CT imaging of the abdomen and pelvis was performed following the standard protocol without IV contrast. COMPARISON:  Multiple abdominal ultrasound dating back to 07/05/2015 FINDINGS: Evaluation of this exam is limited in the absence of intravenous contrast. The visualized lung bases are clear. No intra-abdominal free air or free fluid. Morphologic changes of cirrhosis. No calcified gallstone. The pancreas appears unremarkable. Splenomegaly measuring 18 cm. The adrenal glands, kidneys, visualized ureters, and urinary bladder appear unremarkable. The prostate and seminal vesicles are grossly unremarkable. There is no evidence of bowel obstruction or inflammation. Normal appendix. There is mild diffuse haziness and stranding of the mesentery with prominence of the vasculature compatible with varices. There is apparent recanalization of the umbilical vein. The abdominal aorta and IVC appear grossly unremarkable on this noncontrast study. No portal venous gas identified. There is no adenopathy. The abdominal wall soft tissues appear unremarkable. There is old-appearing compression deformity of the superior endplate of the T11 vertebra. No acute fracture. IMPRESSION: No hydronephrosis or nephrolithiasis. Cirrhosis with evidence of portal hypertension, abdominal varices and splenomegaly. No ascites. Electronically Signed   By: Elgie Collard M.D.   On: 11/05/2015 03:44     CBC  Recent Labs Lab 11/04/15 1925 11/07/15 1634 11/08/15 0528   WBC 7.1 6.3 5.4  HGB 10.1* 10.4* 9.6*  HCT 30.0* 30.8* 28.4*  PLT 133* 135* 119*  MCV 95.0 94.5 93.1  MCH 32.0 31.8 31.5  MCHC 33.6 33.6 33.9  RDW 15.2* 15.1* 15.1*    Chemistries   Recent Labs Lab 11/04/15 1925 11/07/15 1634 11/08/15 0528  NA 141 139 141  K 3.9 4.0 3.7  CL 113* 108 109  CO2 21* 23 24  GLUCOSE 137* 116* 79  BUN 22* 17 17  CREATININE 1.30* 0.91 0.84  CALCIUM 9.0 9.4 9.2  AST  --  63*  --   ALT  --  61  --   ALKPHOS  --  117  --   BILITOT  --  0.8  --    ------------------------------------------------------------------------------------------------------------------ estimated creatinine clearance is 175.6 mL/min (by C-G formula based on Cr of 0.84). ------------------------------------------------------------------------------------------------------------------ No results for input(s): HGBA1C in the last 72 hours. ------------------------------------------------------------------------------------------------------------------ No results for input(s): CHOL, HDL, LDLCALC, TRIG, CHOLHDL, LDLDIRECT in the last 72 hours. ------------------------------------------------------------------------------------------------------------------ No results for input(s): TSH, T4TOTAL, T3FREE, THYROIDAB in the last 72 hours.  Invalid input(s): FREET3 ------------------------------------------------------------------------------------------------------------------ No results for input(s): VITAMINB12, FOLATE, FERRITIN, TIBC, IRON, RETICCTPCT in the last 72 hours.  Coagulation profile No results for input(s): INR, PROTIME in the last 168 hours.  No results for input(s): DDIMER in the last 72 hours.  Cardiac Enzymes  Recent Labs Lab 11/04/15 1925 11/05/15 0004  TROPONINI <0.03 <0.03   ------------------------------------------------------------------------------------------------------------------ Invalid input(s): POCBNP    Assessment & Plan   Dez Stauffer  is a 35 y.o. male with a known history of alcoholic liver cirrhosis, hypertension, schizophrenia, diabetes mellitus, COPD on 2 L oxygen, recent admission for hepatic encephalopathy last week presents again to the hospital secondary to worsening confusion and also tremors that started yesterday.  #1 hepatic encephalopathy- -Continue lactulose and also rifaximin. -Ammonia level is trending down mental status improving   #2 alcoholic liver cirrhosis-has chronic thrombocytopenia which is at baseline. -No alcohol use for 2 weeks now -Continue  Nadolol as history of esophageal varices that have been banded -Continue on Aldactone.  #3 hypertension-on losartan, hydrochlorothiazide and nadolol. Blood pressure stable  #4 diabetes mellitus continue metformin and sliding scale  #5 schizophrenia-sContinue Zyprexa at bedtime Continue his Celexa for depression  #6 DVT prophylaxis-on Lovenox. Monitor hemoglobin and platelets      Code Status Orders        Start     Ordered   11/07/15 2203  Full code   Continuous     11/07/15 2202    Code Status History    Date Active Date Inactive Code Status Order ID Comments User Context   10/28/2015  8:14 PM 10/29/2015  2:26 PM Full Code [id number]  Altamese Dilling, MD Inpatient   10/16/2015 11:47 PM 10/17/2015  6:00 PM Full Code [id number]  Oralia Manis, MD Inpatient   07/30/2015  5:35 PM 08/04/2015  8:34 PM Full Code [id number]  Enedina Finner, MD Inpatient   07/05/2015  1:16 AM 07/08/2015  7:22 PM Full Code [id number]  Oralia Manis, MD Inpatient   05/30/2015  7:28 PM 06/01/2015  3:36 PM Full Code [id number]  Audery Amel, MD Inpatient           Consults  none DVT Prophylaxis  Lovenox   Lab Results  Component Value Date   PLT 119* 11/08/2015     Time Spent in minutes    Auburn Bilberry M.D on 11/08/2015 at 1:14 PM  Between 7am to 6pm - Pager - [phone]  After 6pm go to [email or web address] - password [email or web address] [facility]  [facility] [place] [facility]    Office  [phone]

## 2015-11-09 LAB — GLUCOSE, CAPILLARY
GLUCOSE-CAPILLARY: 95 mg/dL (ref 65–99)
Glucose-Capillary: 90 mg/dL (ref 65–99)

## 2015-11-09 LAB — AMMONIA: AMMONIA: 60 umol/L — AB (ref 9–35)

## 2015-11-09 MED ORDER — LACTULOSE 10 GM/15ML PO SOLN: 30.0000 g | Freq: Three times a day (TID) | ORAL | Status: DC

## 2015-11-09 NOTE — Discharge Summary (Signed)
Howard [name], 35 y.o., DOB 1980/12/14, MRN [id number]. Admission date: 11/07/2015 Discharge Date 11/09/2015 Primary MD Imelda Pillow, NP Admitting Physician Enid Baas, MD  Admission Diagnosis  Dizziness,losss of memory  Discharge Diagnosis   Active Problems:   Hepatic encephalopathy (HCC) Alcoholic liver cirrhosis with ascites Hypertension Schizophrenia Depression Diabetes type 2 Chronic respiratory on chronic oxygen therapy Esophageal varices         Hospital Course Tiegan Mcevers is a 35 y.o. male with a known history of alcoholic liver cirrhosis, hypertension, schizophrenia, diabetes mellitus, COPD on 2 L oxygen, recent admission for hepatic encephalopathy last week presents again to the hospital secondary to worsening confusion and also tremors that started day before. Patient's ammonia level was very high and was admitted for hepatic encephalopathy. Patient's lactulose dose was increased. His ammonia level started treding down. His mental status returned back to normal. He was seen by physical therapy they recommended home PT. Currently patient is back to baseline and stable for discharge.           Consults  None  Significant Tests:  See full reports for all details    Dg Chest 2 View  11/04/2015  CLINICAL DATA:  35 year old presenting with acute onset of right-sided chest pain at approximately 2 o'clock p.m. this afternoon. Current history of hypertension, diabetes and alcoholic cirrhosis. EXAM: CHEST  2 VIEW COMPARISON:  10/28/2015 and earlier, including CTA chest 07/05/2015. FINDINGS: Cardiomediastinal silhouette unremarkable, unchanged. Mildly prominent bronchovascular markings diffusely and mild central peribronchial thickening, unchanged over multiple examinations. Linear scarring in the lingula, unchanged. Lungs otherwise clear. No localized airspace consolidation. No pleural effusions. No pneumothorax. Normal pulmonary vascularity. Mild degenerative changes  involving the thoracic spine. IMPRESSION: Stable lingular scarring and mild changes of chronic bronchitis and/or asthma. No acute cardiopulmonary disease. Electronically Signed   By: Hulan Saas M.D.   On: 11/04/2015 21:46   Dg Chest 2 View  10/28/2015  CLINICAL DATA:  Chest pain that started today. Known cirrhosis. Uses O2 at home, but denies lung disease. EXAM: CHEST  2 VIEW COMPARISON:  10/16/2015 FINDINGS: The heart size and mediastinal contours are within normal limits. Both lungs are clear. The visualized skeletal structures are unremarkable. IMPRESSION: No active cardiopulmonary disease. Electronically Signed   By: Norva Pavlov M.D.   On: 10/28/2015 15:49   Dg Chest Portable 1 View  10/16/2015  CLINICAL DATA:  35 year old male with altered mental status, dizziness, liver disease and diabetes. EXAM: PORTABLE CHEST 1 VIEW COMPARISON:  07/30/2015 and prior exams FINDINGS: The cardiomediastinal silhouette is unremarkable. There is no evidence of focal airspace disease, pulmonary edema, suspicious pulmonary nodule/mass, pleural effusion, or pneumothorax. No acute bony abnormalities are identified. IMPRESSION: No active disease. Electronically Signed   By: Harmon Pier M.D.   On: 10/16/2015 20:48   Ct Renal Stone Study  11/05/2015  CLINICAL DATA:  35 year old male chest with fracture pain and decreased urine output. EXAM: CT ABDOMEN AND PELVIS WITHOUT CONTRAST TECHNIQUE: Multidetector CT imaging of the abdomen and pelvis was performed following the standard protocol without IV contrast. COMPARISON:  Multiple abdominal ultrasound dating back to 07/05/2015 FINDINGS: Evaluation of this exam is limited in the absence of intravenous contrast. The visualized lung bases are clear. No intra-abdominal free air or free fluid. Morphologic changes of cirrhosis. No calcified gallstone. The pancreas appears unremarkable. Splenomegaly measuring 18 cm. The adrenal glands, kidneys, visualized ureters, and urinary  bladder appear unremarkable. The prostate and seminal vesicles are grossly unremarkable. There is no evidence  of bowel obstruction or inflammation. Normal appendix. There is mild diffuse haziness and stranding of the mesentery with prominence of the vasculature compatible with varices. There is apparent recanalization of the umbilical vein. The abdominal aorta and IVC appear grossly unremarkable on this noncontrast study. No portal venous gas identified. There is no adenopathy. The abdominal wall soft tissues appear unremarkable. There is old-appearing compression deformity of the superior endplate of the T11 vertebra. No acute fracture. IMPRESSION: No hydronephrosis or nephrolithiasis. Cirrhosis with evidence of portal hypertension, abdominal varices and splenomegaly. No ascites. Electronically Signed   By: Elgie Collard M.D.   On: 11/05/2015 03:44       Today   Subjective:   Howard [name] feels okay denies any complaints  Objective:   Blood pressure 129/75, pulse 70, temperature 97.7 F (36.5 C), temperature source Oral, resp. rate 18, height  (1.905 m), weight 128.595 kg (283 lb 8 oz), SpO2 98 %.  .  Intake/Output Summary (Last 24 hours) at 11/09/15 1134 Last data filed at 11/09/15 0900  Gross per 24 hour  Intake    480 ml  Output      4 ml  Net    476 ml    Exam VITAL SIGNS: Blood pressure 129/75, pulse 70, temperature 97.7 F (36.5 C), temperature source Oral, resp. rate 18, height  (1.905 m), weight 128.595 kg (283 lb 8 oz), SpO2 98 %.  GENERAL:  35 y.o.-year-old patient lying in the bed with no acute distress.  EYES: Pupils equal, round, reactive to light and accommodation. No scleral icterus. Extraocular muscles intact.  HEENT: Head atraumatic, normocephalic. Oropharynx and nasopharynx clear.  NECK:  Supple, no jugular venous distention. No thyroid enlargement, no tenderness.  LUNGS: Normal breath sounds bilaterally, no wheezing, rales,rhonchi or crepitation. No use  of accessory muscles of respiration.  CARDIOVASCULAR: S1, S2 normal. No murmurs, rubs, or gallops.  ABDOMEN: Soft, nontender, nondistended. Bowel sounds present. No organomegaly or mass.  EXTREMITIES: No pedal edema, cyanosis, or clubbing.  NEUROLOGIC: Cranial nerves II through XII are intact. Muscle strength 5/5 in all extremities. Sensation intact. Gait not checked.  PSYCHIATRIC: The patient is alert and oriented x 3.  SKIN: No obvious rash, lesion, or ulcer.   Data Review     CBC w Diff: Lab Results  Component Value Date   WBC 5.4 11/08/2015   WBC 10.1 12/25/2014   HGB 9.6* 11/08/2015   HGB 14.4 12/25/2014   HCT 28.4* 11/08/2015   HCT 44.7 12/25/2014   PLT 119* 11/08/2015   PLT 155 12/25/2014   LYMPHOPCT 13 07/04/2015   MONOPCT 9 07/04/2015   EOSPCT 1 07/04/2015   BASOPCT 1 07/04/2015   CMP: Lab Results  Component Value Date   NA 141 11/08/2015   NA 141 12/25/2014   K 3.7 11/08/2015   K 3.4* 12/25/2014   CL 109 11/08/2015   CL 100* 12/25/2014   CO2 24 11/08/2015   CO2 30 12/25/2014   BUN 17 11/08/2015   BUN 15 12/25/2014   CREATININE 0.84 11/08/2015   CREATININE 1.23 12/25/2014   PROT 7.3 11/07/2015   PROT 8.5* 12/25/2014   ALBUMIN 3.7 11/07/2015   ALBUMIN 4.1 12/25/2014   BILITOT 0.8 11/07/2015   BILITOT 0.9 12/25/2014   ALKPHOS 117 11/07/2015   ALKPHOS 148* 12/25/2014   AST 63* 11/07/2015   AST 146* 12/25/2014   ALT 61 11/07/2015   ALT 107* 12/25/2014  .  Micro Results No results found for this or  any previous visit (from the past 240 hour(s)).      Code Status Orders        Start     Ordered   11/07/15 2203  Full code   Continuous     11/07/15 2202    Code Status History    Date Active Date Inactive Code Status Order ID Comments User Context   10/28/2015  8:14 PM 10/29/2015  2:26 PM Full Code [id number]  Altamese Dilling, MD Inpatient   10/16/2015 11:47 PM 10/17/2015  6:00 PM Full Code [id number]  Oralia Manis, MD Inpatient   07/30/2015  5:35  PM 08/04/2015  8:34 PM Full Code [id number]  Enedina Finner, MD Inpatient   07/05/2015  1:16 AM 07/08/2015  7:22 PM Full Code [id number]  Oralia Manis, MD Inpatient   05/30/2015  7:28 PM 06/01/2015  3:36 PM Full Code [id number]  Audery Amel, MD Inpatient          Follow-up Information    Follow up with HOLLAND, CHELSA, NP In 5 days.   Specialty:  Nurse Practitioner   Contact information:   [address] [place] [address] [phone]       Discharge Medications     Medication List    TAKE these medications        albuterol 108 (90 Base) MCG/ACT inhaler  Commonly known as:  PROVENTIL HFA;VENTOLIN HFA  Inhale 2 puffs into the lungs every 6 (six) hours as needed for wheezing or shortness of breath.     aspirin EC 81 MG tablet  Take 81 mg by mouth daily.     citalopram 20 MG tablet  Commonly known as:  CELEXA  Take 1 tablet (20 mg total) by mouth daily.     diphenhydrAMINE 25 mg capsule  Commonly known as:  BENADRYL  TAKE 2 CAPSULES BY MOUTH AT BEDTIME     furosemide 20 MG tablet  Commonly known as:  LASIX  Take 20 mg by mouth 2 (two) times daily.     lactulose 10 GM/15ML solution  Commonly known as:  CHRONULAC  Take 45 mLs (30 g total) by mouth 3 (three) times daily.     losartan-hydrochlorothiazide 100-25 MG tablet  Commonly known as:  HYZAAR  Take 1 tablet by mouth daily.     metFORMIN 500 MG tablet  Commonly known as:  GLUCOPHAGE  Take 1 tablet (500 mg total) by mouth 2 (two) times daily with a meal.     metoCLOPramide 10 MG tablet  Commonly known as:  REGLAN  Take 1 tablet (10 mg total) by mouth 3 (three) times daily before meals.     metoprolol succinate 100 MG 24 hr tablet  Commonly known as:  TOPROL-XL  Take 100 mg by mouth daily.     multivitamin with minerals Tabs tablet  Take 1 tablet by mouth daily.     OLANZapine 15 MG tablet  Commonly known as:  ZYPREXA  Take 1 tablet (15 mg total) by mouth at bedtime.     omeprazole 40 MG capsule   Commonly known as:  PRILOSEC  Take 40 mg by mouth every morning.     simvastatin 40 MG tablet  Commonly known as:  ZOCOR  Take 40 mg by mouth at bedtime.           Total Time in preparing paper work, data evaluation and todays exam - 35 minutes  Auburn Bilberry M.D on 11/09/2015 at 11:34 AM  [facility]  Office  [phone]

## 2015-11-09 NOTE — Care Management Important Message (Signed)
Important Message  Patient Details  Name: Howard [name] MRN: [id number] Date of Birth: 01-19-1981   Medicare Important Message Given:  Yes    [name] 11/09/2015, 11:05 AM

## 2015-11-09 NOTE — Discharge Instructions (Signed)
DIET:  Diabetic diet, low sodium diet  DISCHARGE CONDITION:  Stable  ACTIVITY:  Activity as tolerated  OXYGEN:  Home Oxygen: Yes.     Oxygen Delivery: 2 liters/min via Patient connected to nasal cannula oxygen  DISCHARGE LOCATION:  home    ADDITIONAL DISCHARGE INSTRUCTION:   If you experience worsening of your admission symptoms, develop shortness of breath, life threatening emergency, suicidal or homicidal thoughts you must seek medical attention immediately by calling 911 or calling your MD immediately  if symptoms less severe.  You Must read complete instructions/literature along with all the possible adverse reactions/side effects for all the Medicines you take and that have been prescribed to you. Take any new Medicines after you have completely understood and accpet all the possible adverse reactions/side effects.   Please note  You were cared for by a hospitalist during your hospital stay. If you have any questions about your discharge medications or the care you received while you were in the hospital after you are discharged, you can call the unit and asked to speak with the hospitalist on call if the hospitalist that took care of you is not available. Once you are discharged, your primary care physician will handle any further medical issues. Please note that NO REFILLS for any discharge medications will be authorized once you are discharged, as it is imperative that you return to your primary care physician (or establish a relationship with a primary care physician if you do not have one) for your aftercare needs so that they can reassess your need for medications and monitor your lab values.  Hepatic Encephalopathy Hepatic encephalopathy is a loss of brain function from advanced liver disease. The effects of the condition depend on the type of liver damage and how severe it is. In some cases, hepatic encephalopathy can be reversed. CAUSES The exact cause of hepatic  encephalopathy is not known. RISK FACTORS You have a higher risk of getting this condition if your liver is damaged. When the liver is damaged harmful substances called toxins can build up in the body. Certain toxins, such as ammonia, can harm your brain. Conditions that can cause liver damage include:  An infection.  Dehydration.  Intestinal bleeding.  Drinking too much alcohol.  Taking certain medicines, including tranquilizers, water pills (diuretics), antidepressants, or sleeping pills. SIGNS AND SYMPTOMS Signs and symptoms may develop suddenly. Or, they may develop slowly and get worse gradually. Symptoms can range from mild to severe. Mild Hepatic Encephalopathy  Mild confusion.  Personality and mood changes.  Anxiety and agitation.  Drowsiness.  Loss of mental abilities.  Musty or sweet-smelling breath. Worsening or Severe Hepatic Encephalopathy  Slowed movement.  Slurred speech.  Extreme personality changes.  Disorientation.  Abnormal shaking or flapping of the hands.  Coma. DIAGNOSIS To make a diagnosis, your health care provider will do a physical exam. To rule out other causes of your signs and symptoms, he or she may order tests. You may have:  Blood tests. These may be done to check your ammonia level, measure how long it takes your blood to clot, and check for infection.  Liver function tests. These may be done to check how well your liver is working.  MRI and CT scans. These may be done to check for a brain disorder.  Electroencephalogram (EEG). This may be done to measure the electrical activity in your brain. TREATMENT The first step in treatment is identifying and treating possible triggers. The next step is involves taking medicine  to lower the level of toxins in the body and to prevent ammonia from building up. You may need to take:  Antibiotics to reduce the ammonia-producing bacteria in your gut.  Lactulose to help flush ammonia from the  gut. HOME CARE INSTRUCTIONS Eating and Drinking  Follow a low-protein diet that includes plenty of fruits, vegetables, and whole grains, as directed by your health care provider. Ammonia is produced when you digest high-protein foods.  Work with a Data processing manager or with your health care provider to make sure you are getting the right balance of protein and minerals.  Drink enough fluids to keep your urine clear or pale yellow. Drinking plenty of water helps prevent constipation.  Do not drink alcohol or use illegal drugs. Medicines  Only take medicine as directed by your health care provider.  If you were prescribed an antibiotic medicine, finish it all even if you start to feel better.  Do not start any new medicines, including over-the-counter medicines, without first checking with your health care provider. SEEK MEDICAL CARE IF:  You have new symptoms.  Your symptoms change.  Your symptoms get worse.  You have a fever.  You are constipated.  You have persistent nausea, vomiting, or diarrhea. SEEK IMMEDIATE MEDICAL CARE IF:  You become very confused or drowsy.  You vomit blood or material that looks like coffee grounds.  Your stool is bloody or black or looks like tar.   This information is not intended to replace advice given to you by your health care provider. Make sure you discuss any questions you have with your health care provider.   Document Released: 11/18/2006 Document Revised: 09/29/2014 Document Reviewed: 04/26/2014 Elsevier Interactive Patient Education 2016 [employer]  Ammonia Test WHY AM I HAVING THIS TEST? Ammonia testing is used to help diagnose and monitor severe liver diseases. It is also used to diagnose and monitor a brain disorder that can develop in individuals who have liver disease (hepatic encephalopathy).  Ammonia levels can rise when the liver and kidneys are not working well enough to get rid of urea. A buildup of ammonia in the body can  cause mental and neurological changes that can lead to confusion, disorientation, sleepiness, and eventually coma and even death. Infants and children with increased ammonia levels may vomit often, be irritable, and be increasingly lethargic. Without treatment they may experience seizures and breathing difficulty and go into a coma and die. WHAT KIND OF SAMPLE IS TAKEN? A blood sample is needed for this test. It is usually collected by inserting a needle into a vein. HOW DO I PREPARE FOR THE TEST? Follow instructions from your health care provider or your child's health care provider about avoiding these before the test:  Exercise.  Smoking cigarettes.  Certain medicines. WHAT ARE THE REFERENCE RANGES? Reference ranges are considered healthy ranges established after testing a large group of healthy people. Reference ranges may vary among different people, labs, and hospitals. It is your responsibility to obtain the test results. Ask the lab or department performing the test when and how you will get the results.  WHAT DO THE RESULTS MEAN? Increased levels of ammonia may mean that you have or your child has:  Liver disease.  Gastrointestinal (GI) bleeding or GI obstruction.  Severe heart failure.  Hemolytic disease of newborn.  Hepatic encephalopathy.  A genetic metabolic disorder. Decreased levels of ammonia may mean that you have or your child has:  High blood pressure (hypertension).  A genetic metabolic syndrome. Talk  with your health care provider to discuss the results, treatment options, and if necessary, the need for more tests. Talk with your health care provider if you have any questions about your results.   This information is not intended to replace advice given to you by your health care provider. Make sure you discuss any questions you have with your health care provider.   Document Released: 09/30/2004 Document Revised: 09/29/2014 Document Reviewed:  02/07/2014 Elsevier Interactive Patient Education [employer]

## 2015-11-09 NOTE — Care Management (Signed)
Spoke with patient he states his home O2 is now through [facility] and he agrees to home health PT. He would like to use [facility] again. I have made referral to [facility] with [facility]. RNCM will continue to follow.

## 2015-11-09 NOTE — Care Management (Signed)
Per MD patient discharging home today and will need home health. Chronic O2. I have notified Barbara Cower with [facility]. No further RNCM needs. Case closed.

## 2015-11-10 ENCOUNTER — Emergency Department
Admission: EM | Admit: 2015-11-10 | Discharge: 2015-11-10 | Disposition: A | Discharge status: Home / Self Care | Payer: Medicare Other | Source: Home / Self Care | Attending: Emergency Medicine | Admitting: Emergency Medicine

## 2015-11-10 ENCOUNTER — Encounter: Payer: Self-pay | Admitting: *Deleted

## 2015-11-10 ENCOUNTER — Inpatient Hospital Stay
Admission: EM | Admit: 2015-11-10 | Discharge: 2015-11-13 | DRG: 871 | Disposition: A | Discharge status: Home / Self Care | Payer: Medicare Other | Attending: Internal Medicine | Admitting: Internal Medicine

## 2015-11-10 ENCOUNTER — Encounter: Payer: Self-pay | Admitting: Emergency Medicine

## 2015-11-10 DIAGNOSIS — K7031 Alcoholic cirrhosis of liver with ascites: Secondary | ICD-10-CM | POA: Diagnosis present

## 2015-11-10 DIAGNOSIS — Z79899 Other long term (current) drug therapy: Secondary | ICD-10-CM

## 2015-11-10 DIAGNOSIS — K766 Portal hypertension: Secondary | ICD-10-CM | POA: Diagnosis present

## 2015-11-10 DIAGNOSIS — Z823 Family history of stroke: Secondary | ICD-10-CM

## 2015-11-10 DIAGNOSIS — Z7982 Long term (current) use of aspirin: Secondary | ICD-10-CM

## 2015-11-10 DIAGNOSIS — R112 Nausea with vomiting, unspecified: Secondary | ICD-10-CM

## 2015-11-10 DIAGNOSIS — K3189 Other diseases of stomach and duodenum: Secondary | ICD-10-CM | POA: Diagnosis present

## 2015-11-10 DIAGNOSIS — Z9889 Other specified postprocedural states: Secondary | ICD-10-CM

## 2015-11-10 DIAGNOSIS — J449 Chronic obstructive pulmonary disease, unspecified: Secondary | ICD-10-CM | POA: Diagnosis present

## 2015-11-10 DIAGNOSIS — K2900 Acute gastritis without bleeding: Secondary | ICD-10-CM | POA: Diagnosis present

## 2015-11-10 DIAGNOSIS — R188 Other ascites: Secondary | ICD-10-CM

## 2015-11-10 DIAGNOSIS — Z811 Family history of alcohol abuse and dependence: Secondary | ICD-10-CM

## 2015-11-10 DIAGNOSIS — A419 Sepsis, unspecified organism: Secondary | ICD-10-CM | POA: Diagnosis not present

## 2015-11-10 DIAGNOSIS — R6889 Other general symptoms and signs: Secondary | ICD-10-CM | POA: Diagnosis not present

## 2015-11-10 DIAGNOSIS — K652 Spontaneous bacterial peritonitis: Secondary | ICD-10-CM | POA: Diagnosis present

## 2015-11-10 DIAGNOSIS — Z8249 Family history of ischemic heart disease and other diseases of the circulatory system: Secondary | ICD-10-CM

## 2015-11-10 DIAGNOSIS — E860 Dehydration: Secondary | ICD-10-CM | POA: Diagnosis present

## 2015-11-10 DIAGNOSIS — R740 Nonspecific elevation of levels of transaminase and lactic acid dehydrogenase [LDH]: Secondary | ICD-10-CM | POA: Diagnosis present

## 2015-11-10 DIAGNOSIS — J9811 Atelectasis: Secondary | ICD-10-CM | POA: Diagnosis present

## 2015-11-10 DIAGNOSIS — K3184 Gastroparesis: Secondary | ICD-10-CM | POA: Diagnosis present

## 2015-11-10 DIAGNOSIS — N170 Acute kidney failure with tubular necrosis: Secondary | ICD-10-CM | POA: Diagnosis present

## 2015-11-10 DIAGNOSIS — R7401 Elevation of levels of liver transaminase levels: Secondary | ICD-10-CM

## 2015-11-10 DIAGNOSIS — K529 Noninfective gastroenteritis and colitis, unspecified: Secondary | ICD-10-CM | POA: Diagnosis present

## 2015-11-10 DIAGNOSIS — R6510 Systemic inflammatory response syndrome (SIRS) of non-infectious origin without acute organ dysfunction: Secondary | ICD-10-CM

## 2015-11-10 DIAGNOSIS — R7989 Other specified abnormal findings of blood chemistry: Secondary | ICD-10-CM

## 2015-11-10 DIAGNOSIS — F209 Schizophrenia, unspecified: Secondary | ICD-10-CM | POA: Diagnosis present

## 2015-11-10 DIAGNOSIS — I8510 Secondary esophageal varices without bleeding: Secondary | ICD-10-CM | POA: Diagnosis present

## 2015-11-10 DIAGNOSIS — Z9981 Dependence on supplemental oxygen: Secondary | ICD-10-CM

## 2015-11-10 DIAGNOSIS — I10 Essential (primary) hypertension: Secondary | ICD-10-CM | POA: Diagnosis present

## 2015-11-10 DIAGNOSIS — F329 Major depressive disorder, single episode, unspecified: Secondary | ICD-10-CM | POA: Diagnosis present

## 2015-11-10 DIAGNOSIS — E1143 Type 2 diabetes mellitus with diabetic autonomic (poly)neuropathy: Secondary | ICD-10-CM | POA: Diagnosis present

## 2015-11-10 DIAGNOSIS — R509 Fever, unspecified: Secondary | ICD-10-CM

## 2015-11-10 DIAGNOSIS — R109 Unspecified abdominal pain: Secondary | ICD-10-CM

## 2015-11-10 DIAGNOSIS — R197 Diarrhea, unspecified: Secondary | ICD-10-CM

## 2015-11-10 LAB — COMPREHENSIVE METABOLIC PANEL
ALT: 52 U/L (ref 17–63)
AST: 54 U/L — AB (ref 15–41)
Albumin: 4 g/dL (ref 3.5–5.0)
Alkaline Phosphatase: 96 U/L (ref 38–126)
Anion gap: 6 (ref 5–15)
BILIRUBIN TOTAL: 1.6 mg/dL — AB (ref 0.3–1.2)
BUN: 34 mg/dL — AB (ref 6–20)
CALCIUM: 8.9 mg/dL (ref 8.9–10.3)
CO2: 24 mmol/L (ref 22–32)
CREATININE: 1.53 mg/dL — AB (ref 0.61–1.24)
Chloride: 106 mmol/L (ref 101–111)
GFR calc Af Amer: >60 mL/min (ref >60)
GFR, EST NON AFRICAN AMERICAN: 57 mL/min — AB (ref >60)
Glucose, Bld: 132 mg/dL — ABNORMAL HIGH (ref 65–99)
POTASSIUM: 4.8 mmol/L (ref 3.5–5.1)
Sodium: 136 mmol/L (ref 135–145)
TOTAL PROTEIN: 8.3 g/dL — AB (ref 6.5–8.1)

## 2015-11-10 LAB — URINALYSIS COMPLETE WITH MICROSCOPIC (ARMC ONLY)
BACTERIA UA: NONE SEEN
BILIRUBIN URINE: NEGATIVE
Glucose, UA: NEGATIVE mg/dL
Ketones, ur: NEGATIVE mg/dL
LEUKOCYTES UA: NEGATIVE
NITRITE: NEGATIVE
PH: 5 (ref 5.0–8.0)
Protein, ur: 30 mg/dL — AB
Specific Gravity, Urine: 1.025 (ref 1.005–1.030)

## 2015-11-10 LAB — CBC
HEMATOCRIT: 35.2 % — AB (ref 40.0–52.0)
Hemoglobin: 11.7 g/dL — ABNORMAL LOW (ref 13.0–18.0)
MCH: 31.9 pg (ref 26.0–34.0)
MCHC: 33.3 g/dL (ref 32.0–36.0)
MCV: 95.8 fL (ref 80.0–100.0)
PLATELETS: 169 10*3/uL (ref 150–440)
RBC: 3.68 MIL/uL — ABNORMAL LOW (ref 4.40–5.90)
RDW: 15 % — AB (ref 11.5–14.5)
WBC: 10.3 10*3/uL (ref 3.8–10.6)

## 2015-11-10 LAB — GLUCOSE, CAPILLARY: GLUCOSE-CAPILLARY: 102 mg/dL — AB (ref 65–99)

## 2015-11-10 LAB — LIPASE, BLOOD: Lipase: 21 U/L (ref 11–51)

## 2015-11-10 MED ORDER — SODIUM CHLORIDE 0.9 % IV BOLUS (SEPSIS)
1000.0000 mL | INTRAVENOUS | Status: AC
  Administered 2015-11-11: 1000 mL via INTRAVENOUS

## 2015-11-10 NOTE — ED Provider Notes (Signed)
[facility] Emergency Department Provider Note  ____________________________________________  Time seen: Approximately 11:50 PM  I have reviewed the triage vital signs and the nursing notes.   HISTORY  Chief Complaint Emesis and Dizziness    HPI Howard [name] is a 35 y.o. 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  He was discharged from the hospital yesterday after an admission for hepatic encephalopathy.  His lactulose was increased during his stay.  After going home he states that he was "fine" for a few hours and then awoke at approximately 3:00 in the morning (nearly 24 hours ago) with acute left-sided abdominal pain and nausea and vomiting.  He states he has vomited at least 8 times during the course of the day today and has not been able to tolerate any fluids by mouth.  He has also been having multiple loose stools in the setting of increased lactulose doses.  He has been taking his antinausea medicine but it is not helping.  Taking anything by mouth makes the symptoms worse.  He describes the symptoms as severe.  He is become tremulous and states that he feels like he is getting dehydrated.  He does have a history of acute renal failure in the past when he has had 1 episodes of vomiting and diarrhea.  At this time he states that he is no longer having any abdominal pain but he still feels nauseated and cannot eat anything yet.  He states he has had decreased urination over the course of the day.   Past Medical History  Diagnosis Date  . Hypertension   . Schizophrenia (HCC)   . Hyperlipemia   . Depression   . Diabetes (HCC)   . Liver disease   . Diabetes mellitus, type II (HCC)   . Heart disease   . Alcoholic cirrhosis of liver with ascites (HCC)   . Esophageal varices (HCC)   . Portal hypertensive  gastropathy     Patient Active Problem List   Diagnosis Date Noted  . Fever 11/11/2015  . Ascites due to alcoholic cirrhosis (HCC) 11/11/2015  . Cirrhosis of liver with ascites (HCC) 11/11/2015  . Hepatic encephalopathy (HCC) 10/16/2015  . Type 2 diabetes mellitus (HCC) 10/16/2015  . Acute renal failure (ARF) (HCC) 07/30/2015  . 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 Hypoxia 07/04/2015  . Elevated transaminase level 07/04/2015  . DOE (dyspnea on exertion) 07/04/2015  . Alcohol abuse 05/31/2015  . Hypertension 05/29/2015  . Paranoid schizophrenia (HCC) 03/20/2015    Past Surgical History  Procedure Laterality Date  . No past surgeries    . Esophagogastroduodenoscopy (egd) with propofol N/A 08/03/2015    Procedure: ESOPHAGOGASTRODUODENOSCOPY (EGD) WITH PROPOFOL;  Surgeon: Elnita Maxwell, MD;  Location[facility] ENDOSCOPY;  Service: Endoscopy;  Laterality: N/A;  . Esophagogastroduodenoscopy (egd) with propofol N/A 08/31/2015    Procedure: ESOPHAGOGASTRODUODENOSCOPY (EGD) WITH PROPOFOL;  Surgeon: Elnita Maxwell, MD;  Location: [facility] ENDOSCOPY;  Service: Endoscopy;  Laterality: N/A;  . Esophagogastroduodenoscopy N/A 10/05/2015    Procedure: ESOPHAGOGASTRODUODENOSCOPY (EGD);  Surgeon: Elnita Maxwell, MD;  Location: [facility] ENDOSCOPY;  Service: Endoscopy;  Laterality: N/A;    No current outpatient prescriptions on file.  Allergies Review of patient's allergies indicates no known allergies.  Family History  Problem Relation Age of Onset  . Heart disease Mother   . Hypertension Mother   . Hyperlipidemia Mother   .  Stroke Father   . Heart attack Father   . Hypertension Father   . Heart disease Father   . Alcohol abuse Father   . Heart disease Brother     Social History Social History  Substance Use Topics  . Smoking status: Never Smoker   . Smokeless tobacco: Never Used  . Alcohol Use: No     Comment: last drank 1 month ago    Review of  Systems Constitutional: Subjective fever/chills starting about 24 hours ago Eyes: No visual changes. ENT: No sore throat. Cardiovascular: Denies chest pain. Respiratory: Denies shortness of breath. Gastrointestinal: Acute onset abdominal pain worse on the left side.  Numerous episodes of vomiting and diarrhea over the course of the day with persistent nausea Genitourinary: Negative for dysuria. Musculoskeletal: Negative for back pain. Skin: Negative for rash. Neurological: Negative for headaches, focal weakness or numbness.  Patient describes tremulousness of his hands and generalized weakness.  10-point ROS otherwise negative.  ____________________________________________   PHYSICAL EXAM:  VITAL SIGNS: ED Triage Vitals  Enc Vitals Group     BP 11/10/15 1937 116/64 mmHg     Pulse Rate 11/10/15 1937 104     Resp 11/10/15 1937 18     Temp 11/10/15 1937 99.1 F (37.3 C)     Temp Source 11/10/15 1937 Oral     SpO2 11/10/15 1937 99 %     Weight 11/10/15 1937 294 lb (133.358 kg)     Height 11/10/15 1937 6\' 3"  (1.905 m)     Head Cir --      Peak Flow --      Pain Score 11/10/15 1938 8     Pain Loc --      Pain Edu? --      Excl. in GC? --     Constitutional: Alert and oriented.  Disheveled, mildly diaphoretic, appears uncomfortable but nontoxic Eyes: Conjunctivae are normal. PERRL. EOMI. no scleral icterus Head: Atraumatic. Nose: No congestion/rhinnorhea. Mouth/Throat: Mucous membranes are tacky.  Oropharynx non-erythematous. Neck: No stridor.   Cardiovascular: Tachycardia, regular rhythm. Grossly normal heart sounds.  Good peripheral circulation. Respiratory: Normal respiratory effort.  No retractions. Lungs CTAB. Gastrointestinal: Soft, obese, mild distention, no gross ascites.  Tender to palpation of the epigastrium and left side of his abdomen.  No rebound, no guarding.  No pain in McBurney's point.  Negative Murphy's sign. Musculoskeletal: No lower extremity tenderness  nor edema.  No joint effusions. Neurologic:  Normal speech and language. No gross focal neurologic deficits are appreciated.  Tremulous when he holds out his hands but no asterixis. Skin:  Skin is warm, dry and intact. No rash noted. Psychiatric: Mood and affect are normal. Speech and behavior are normal.  ____________________________________________   LABS (all labs ordered are listed, but only abnormal results are displayed)  Labs Reviewed  GLUCOSE, CAPILLARY - Abnormal; Notable for the following:    Glucose-Capillary 102 (*)    All other components within normal limits  AMMONIA - Abnormal; Notable for the following:    Ammonia 71 (*)    All other components within normal limits  COMPREHENSIVE METABOLIC PANEL - Abnormal; Notable for the following:    BUN 42 (*)    Creatinine, Ser 2.21 (*)    Calcium 8.6 (*)    AST 45 (*)    Total Bilirubin 1.5 (*)    GFR calc non Af Amer 37 (*)    GFR calc Af Amer 42 (*)    All other components within normal  limits  CBC - Abnormal; Notable for the following:    RBC 3.10 (*)    Hemoglobin 9.8 (*)    HCT 29.1 (*)    RDW 14.9 (*)    Platelets 104 (*)    All other components within normal limits  URINE CULTURE  CULTURE, BLOOD (ROUTINE X 2)  CULTURE, BLOOD (ROUTINE X 2)  LACTIC ACID, PLASMA  LACTIC ACID, PLASMA  CBG MONITORING, ED     ____________________________________________  EKG  ED ECG REPORT I, Gaynor Ferreras, the attending physician, personally viewed and interpreted this ECG.  Date: 11/10/2015 EKG Time: 19:34 Rate: 104 Rhythm: Sinus tachycardia QRS Axis: normal Intervals: normal ST/T Wave abnormalities: normal Conduction Disturbances: none Narrative Interpretation: unremarkable  ____________________________________________  RADIOLOGY   Dg Chest 2 View  11/11/2015  CLINICAL DATA:  Fever and tachypneic. Chest pain earlier today. No current pain. Nausea and vomiting today. Released from [facility] 2 days ago. Dizziness. EXAM: CHEST  2 VIEW COMPARISON:  11/04/2015 FINDINGS: Shallow inspiration with linear atelectasis in the lung bases. Normal heart size and pulmonary vascularity. No focal airspace disease or consolidation the lungs. No blunting of costophrenic angles. No pneumothorax. Mediastinal contours appear intact. Anterior wedging of a lower thoracic vertebra, probably T12. This was present previously. IMPRESSION: Shallow inspiration with atelectasis in the lung bases. Electronically Signed   By: Burman Nieves M.D.   On: 11/11/2015 01:14    ____________________________________________   PROCEDURES  Procedure(s) performed: None  Critical Care performed: No ____________________________________________   INITIAL IMPRESSION / ASSESSMENT AND PLAN / ED COURSE  Pertinent labs & imaging results that were available during my care of the patient were reviewed by me and considered in my medical decision making (see chart for details).  Patient appears to be volume depleted with an increase of creatinine from 0.7 to 1.5.  Other labs appear roughly baseline though t-bili is also slightly elevated compared to prior (likely from the volume depletion).  Labs also notable for hematuria.  Will provide normal saline IV hydration and Zofran.  I will also check an ammonia level to make sure that it is not continuing to go up after his recent discharge from the hospital.  Although he is tremulous at this time he does not have asterixis.  ----------------------------------------- 2:56 AM on 11/11/2015 -----------------------------------------  While awaiting a room, the patient developed a temperature of 102.7.  He is having rigors and remains diaphoretic.  He is ill-appearing although nontoxic.  I have not identified a source; his urine is not the source and he has no obvious evidence of pneumonia.  After 1 L of fluids she is still mildly tachycardic.  I am going to treat him with empiric  antibiotics (ceftriaxone 2 g IV because cefotaxime is on national shortage, as well as vancomycin 2 g IV) for possible intra-abdominal infection and/or bacteremia given the rigors and the fever.  I used a bedside ultrasound to look for pockets of ascites that would be safe to aspirate in the emergency department and he does not have a large enough fluid collection to allow for a safe diagnostic paracentesis.  I will discuss all of this with the hospitalist and explaining the need for admission for SIRS/sepsis without a specific source at this time but concerned for intraabdominal (possible SBP).  The patient had an abdominal CT scan less than a week ago which was unremarkable and it is very unlikely that he would benefit from another scan today given the low probability  that something would have developed that would be visible on the CT scan.   ____________________________________________  FINAL CLINICAL IMPRESSION(S) / ED DIAGNOSES  Final diagnoses:  Fever, unspecified fever cause  Rigors  Nausea and vomiting, vomiting of unspecified type  Diarrhea, unspecified type  Abdominal pain, unspecified abdominal location  Increased ammonia level  Alcoholic cirrhosis of liver with ascites (HCC)  Elevated transaminase level  SIRS (systemic inflammatory response syndrome) (HCC)  Ascites      NEW MEDICATIONS STARTED DURING THIS VISIT:  Current Discharge Medication List       Loleta Rose, MD 11/11/15 [phone]

## 2015-11-10 NOTE — ED Notes (Signed)
States abdominal pain began 3 am today, nausea this am, took nausea medicine and lactulose, loose BM since

## 2015-11-10 NOTE — ED Notes (Signed)
Pt arrived to ED reporting NVD beginning this morning at 3:00. Pt reports vomiting 8 times since then and having an unknown amount of loose stool. Pt reports dizziness upon standing. Pt reports being unable to keep fluids down and has increased chills since waking up this am. Pt was recently hospitalized for elevated ammonia levels and released yesterday.

## 2015-11-11 ENCOUNTER — Emergency Department: Payer: Medicare Other

## 2015-11-11 ENCOUNTER — Encounter: Payer: Self-pay | Admitting: Internal Medicine

## 2015-11-11 DIAGNOSIS — Z9981 Dependence on supplemental oxygen: Secondary | ICD-10-CM | POA: Diagnosis not present

## 2015-11-11 DIAGNOSIS — K2900 Acute gastritis without bleeding: Secondary | ICD-10-CM | POA: Diagnosis present

## 2015-11-11 DIAGNOSIS — J9811 Atelectasis: Secondary | ICD-10-CM | POA: Diagnosis present

## 2015-11-11 DIAGNOSIS — Z823 Family history of stroke: Secondary | ICD-10-CM | POA: Diagnosis not present

## 2015-11-11 DIAGNOSIS — K3184 Gastroparesis: Secondary | ICD-10-CM | POA: Diagnosis present

## 2015-11-11 DIAGNOSIS — F329 Major depressive disorder, single episode, unspecified: Secondary | ICD-10-CM | POA: Diagnosis present

## 2015-11-11 DIAGNOSIS — J449 Chronic obstructive pulmonary disease, unspecified: Secondary | ICD-10-CM | POA: Diagnosis present

## 2015-11-11 DIAGNOSIS — F209 Schizophrenia, unspecified: Secondary | ICD-10-CM | POA: Diagnosis present

## 2015-11-11 DIAGNOSIS — A419 Sepsis, unspecified organism: Secondary | ICD-10-CM | POA: Diagnosis present

## 2015-11-11 DIAGNOSIS — Z8249 Family history of ischemic heart disease and other diseases of the circulatory system: Secondary | ICD-10-CM | POA: Diagnosis not present

## 2015-11-11 DIAGNOSIS — I8510 Secondary esophageal varices without bleeding: Secondary | ICD-10-CM | POA: Diagnosis present

## 2015-11-11 DIAGNOSIS — R740 Nonspecific elevation of levels of transaminase and lactic acid dehydrogenase [LDH]: Secondary | ICD-10-CM | POA: Diagnosis present

## 2015-11-11 DIAGNOSIS — K7031 Alcoholic cirrhosis of liver with ascites: Secondary | ICD-10-CM | POA: Diagnosis present

## 2015-11-11 DIAGNOSIS — I10 Essential (primary) hypertension: Secondary | ICD-10-CM | POA: Diagnosis present

## 2015-11-11 DIAGNOSIS — K766 Portal hypertension: Secondary | ICD-10-CM | POA: Diagnosis present

## 2015-11-11 DIAGNOSIS — Z79899 Other long term (current) drug therapy: Secondary | ICD-10-CM | POA: Diagnosis not present

## 2015-11-11 DIAGNOSIS — N170 Acute kidney failure with tubular necrosis: Secondary | ICD-10-CM | POA: Diagnosis present

## 2015-11-11 DIAGNOSIS — E1143 Type 2 diabetes mellitus with diabetic autonomic (poly)neuropathy: Secondary | ICD-10-CM | POA: Diagnosis present

## 2015-11-11 DIAGNOSIS — R6889 Other general symptoms and signs: Secondary | ICD-10-CM | POA: Diagnosis present

## 2015-11-11 DIAGNOSIS — Z7982 Long term (current) use of aspirin: Secondary | ICD-10-CM | POA: Diagnosis not present

## 2015-11-11 DIAGNOSIS — Z9889 Other specified postprocedural states: Secondary | ICD-10-CM | POA: Diagnosis not present

## 2015-11-11 DIAGNOSIS — K3189 Other diseases of stomach and duodenum: Secondary | ICD-10-CM | POA: Diagnosis present

## 2015-11-11 DIAGNOSIS — E860 Dehydration: Secondary | ICD-10-CM | POA: Diagnosis present

## 2015-11-11 DIAGNOSIS — K529 Noninfective gastroenteritis and colitis, unspecified: Secondary | ICD-10-CM | POA: Diagnosis present

## 2015-11-11 DIAGNOSIS — Z811 Family history of alcohol abuse and dependence: Secondary | ICD-10-CM | POA: Diagnosis not present

## 2015-11-11 DIAGNOSIS — K652 Spontaneous bacterial peritonitis: Secondary | ICD-10-CM | POA: Diagnosis present

## 2015-11-11 LAB — COMPREHENSIVE METABOLIC PANEL
ALBUMIN: 3.5 g/dL (ref 3.5–5.0)
ALT: 43 U/L (ref 17–63)
ANION GAP: 7 (ref 5–15)
AST: 45 U/L — AB (ref 15–41)
Alkaline Phosphatase: 69 U/L (ref 38–126)
BILIRUBIN TOTAL: 1.5 mg/dL — AB (ref 0.3–1.2)
BUN: 42 mg/dL — AB (ref 6–20)
CHLORIDE: 107 mmol/L (ref 101–111)
CO2: 23 mmol/L (ref 22–32)
Calcium: 8.6 mg/dL — ABNORMAL LOW (ref 8.9–10.3)
Creatinine, Ser: 2.21 mg/dL — ABNORMAL HIGH (ref 0.61–1.24)
GFR calc Af Amer: 42 mL/min — ABNORMAL LOW (ref >60)
GFR calc non Af Amer: 37 mL/min — ABNORMAL LOW (ref >60)
GLUCOSE: 82 mg/dL (ref 65–99)
POTASSIUM: 4.2 mmol/L (ref 3.5–5.1)
Sodium: 137 mmol/L (ref 135–145)
TOTAL PROTEIN: 7 g/dL (ref 6.5–8.1)

## 2015-11-11 LAB — CBC
HEMATOCRIT: 29.1 % — AB (ref 40.0–52.0)
HEMOGLOBIN: 9.8 g/dL — AB (ref 13.0–18.0)
MCH: 31.5 pg (ref 26.0–34.0)
MCHC: 33.6 g/dL (ref 32.0–36.0)
MCV: 93.8 fL (ref 80.0–100.0)
Platelets: 104 10*3/uL — ABNORMAL LOW (ref 150–440)
RBC: 3.1 MIL/uL — ABNORMAL LOW (ref 4.40–5.90)
RDW: 14.9 % — AB (ref 11.5–14.5)
WBC: 6.6 10*3/uL (ref 3.8–10.6)

## 2015-11-11 LAB — LACTIC ACID, PLASMA
LACTIC ACID, VENOUS: 1 mmol/L (ref 0.5–2.0)
Lactic Acid, Venous: 1.1 mmol/L (ref 0.5–2.0)

## 2015-11-11 LAB — AMMONIA: Ammonia: 71 umol/L — ABNORMAL HIGH (ref 9–35)

## 2015-11-11 MED ORDER — SIMVASTATIN 40 MG PO TABS
40.0000 mg | ORAL_TABLET | Freq: Every day | ORAL | Status: DC
  Administered 2015-11-11 – 2015-11-12 (×2): 40 mg via ORAL
  Filled 2015-11-11 (×2): qty 1

## 2015-11-11 MED ORDER — SODIUM CHLORIDE 0.9 % IV SOLN
Freq: Once | INTRAVENOUS | Status: DC
Start: 2015-11-11 — End: 2015-11-12

## 2015-11-11 MED ORDER — LACTULOSE 10 GM/15ML PO SOLN
30.0000 g | Freq: Three times a day (TID) | ORAL | Status: DC
  Administered 2015-11-11: 30 g via ORAL
  Filled 2015-11-11: qty 60

## 2015-11-11 MED ORDER — SODIUM CHLORIDE 0.9 % IV SOLN
INTRAVENOUS | Status: DC
  Administered 2015-11-11: 06:00:00 via INTRAVENOUS

## 2015-11-11 MED ORDER — CITALOPRAM HYDROBROMIDE 20 MG PO TABS
20.0000 mg | ORAL_TABLET | Freq: Every day | ORAL | Status: DC
  Administered 2015-11-11 – 2015-11-13 (×3): 20 mg via ORAL
  Filled 2015-11-11 (×3): qty 1

## 2015-11-11 MED ORDER — ONDANSETRON HCL 4 MG/2ML IJ SOLN
4.0000 mg | Freq: Once | INTRAMUSCULAR | Status: AC
  Administered 2015-11-11: 4 mg via INTRAVENOUS
  Filled 2015-11-11: qty 2

## 2015-11-11 MED ORDER — DEXTROSE 5 % IV SOLN
2.0000 g | INTRAVENOUS | Status: DC
  Administered 2015-11-11 – 2015-11-12 (×2): 2 g via INTRAVENOUS
  Filled 2015-11-11 (×3): qty 2

## 2015-11-11 MED ORDER — LOSARTAN POTASSIUM 50 MG PO TABS: 100.0000 mg | ORAL_TABLET | Freq: Every day | ORAL | Status: DC

## 2015-11-11 MED ORDER — CEFTRIAXONE SODIUM 2 G IJ SOLR
2.0000 g | INTRAMUSCULAR | Status: DC
  Filled 2015-11-11: qty 2

## 2015-11-11 MED ORDER — FOLIC ACID 1 MG PO TABS
1.0000 mg | ORAL_TABLET | Freq: Every day | ORAL | Status: DC
  Administered 2015-11-11 – 2015-11-13 (×3): 1 mg via ORAL
  Filled 2015-11-11 (×3): qty 1

## 2015-11-11 MED ORDER — OLANZAPINE 7.5 MG PO TABS
15.0000 mg | ORAL_TABLET | Freq: Every day | ORAL | Status: DC
  Administered 2015-11-11 – 2015-11-12 (×2): 15 mg via ORAL
  Filled 2015-11-11: qty 6
  Filled 2015-11-11: qty 2

## 2015-11-11 MED ORDER — CEFTRIAXONE SODIUM 2 G IJ SOLR
2.0000 g | Freq: Once | INTRAMUSCULAR | Status: AC
  Administered 2015-11-11: 2 g via INTRAVENOUS
  Filled 2015-11-11: qty 2

## 2015-11-11 MED ORDER — ONDANSETRON HCL 4 MG/2ML IJ SOLN: 4.0000 mg | Freq: Four times a day (QID) | INTRAMUSCULAR | Status: DC | PRN

## 2015-11-11 MED ORDER — METOCLOPRAMIDE HCL 5 MG PO TABS
10.0000 mg | ORAL_TABLET | Freq: Three times a day (TID) | ORAL | Status: DC
  Administered 2015-11-11 – 2015-11-13 (×7): 10 mg via ORAL
  Filled 2015-11-11 (×4): qty 1
  Filled 2015-11-11: qty 2
  Filled 2015-11-11: qty 1
  Filled 2015-11-11: qty 2

## 2015-11-11 MED ORDER — VITAMIN B-1 100 MG PO TABS
100.0000 mg | ORAL_TABLET | Freq: Every day | ORAL | Status: DC
  Administered 2015-11-11 – 2015-11-13 (×3): 100 mg via ORAL
  Filled 2015-11-11 (×3): qty 1

## 2015-11-11 MED ORDER — ALBUTEROL SULFATE (2.5 MG/3ML) 0.083% IN NEBU
3.0000 mL | INHALATION_SOLUTION | Freq: Four times a day (QID) | RESPIRATORY_TRACT | Status: DC | PRN
Start: 2015-11-11 — End: 2015-11-13

## 2015-11-11 MED ORDER — PANTOPRAZOLE SODIUM 40 MG PO TBEC
40.0000 mg | DELAYED_RELEASE_TABLET | Freq: Every day | ORAL | Status: DC
  Administered 2015-11-11: 40 mg via ORAL
  Filled 2015-11-11: qty 1

## 2015-11-11 MED ORDER — VANCOMYCIN HCL IN DEXTROSE 1-5 GM/200ML-% IV SOLN
1000.0000 mg | Freq: Three times a day (TID) | INTRAVENOUS | Status: DC
  Administered 2015-11-11 – 2015-11-12 (×4): 1000 mg via INTRAVENOUS
  Filled 2015-11-11 (×5): qty 200

## 2015-11-11 MED ORDER — ADULT MULTIVITAMIN W/MINERALS CH
1.0000 | ORAL_TABLET | Freq: Every day | ORAL | Status: DC
  Administered 2015-11-11 – 2015-11-12 (×2): 1 via ORAL
  Filled 2015-11-11 (×2): qty 1

## 2015-11-11 MED ORDER — METOPROLOL SUCCINATE ER 100 MG PO TB24: 100.0000 mg | ORAL_TABLET | Freq: Every day | ORAL | Status: DC

## 2015-11-11 MED ORDER — PANTOPRAZOLE SODIUM 40 MG IV SOLR
40.0000 mg | Freq: Two times a day (BID) | INTRAVENOUS | Status: DC
  Administered 2015-11-11 – 2015-11-13 (×4): 40 mg via INTRAVENOUS
  Filled 2015-11-11 (×4): qty 40

## 2015-11-11 MED ORDER — DEXTROSE 5 % IV SOLN: 1.0000 g | INTRAVENOUS | Status: AC

## 2015-11-11 MED ORDER — SODIUM CHLORIDE 0.9 % IV SOLN
INTRAVENOUS | Status: DC
Start: 2015-11-11 — End: 2015-11-13
  Administered 2015-11-11 – 2015-11-12 (×4): via INTRAVENOUS

## 2015-11-11 MED ORDER — VANCOMYCIN HCL IN DEXTROSE 1-5 GM/200ML-% IV SOLN
2000.0000 mg | INTRAVENOUS | Status: AC
  Administered 2015-11-11 (×2): 1000 mg via INTRAVENOUS
  Filled 2015-11-11: qty 400

## 2015-11-11 MED ORDER — SODIUM CHLORIDE 0.9% FLUSH
3.0000 mL | Freq: Two times a day (BID) | INTRAVENOUS | Status: DC
  Administered 2015-11-12 – 2015-11-13 (×3): 3 mL via INTRAVENOUS

## 2015-11-11 MED ORDER — LOSARTAN POTASSIUM-HCTZ 100-25 MG PO TABS: 1.0000 | ORAL_TABLET | Freq: Every day | ORAL | Status: DC

## 2015-11-11 MED ORDER — HYDROCHLOROTHIAZIDE 25 MG PO TABS: 25.0000 mg | ORAL_TABLET | Freq: Every day | ORAL | Status: DC

## 2015-11-11 NOTE — ED Notes (Signed)
Pt returned from xray

## 2015-11-11 NOTE — Consult Note (Signed)
GI Inpatient Consult Note  Reason for Consult:   Attending Requesting Consult:  History of Present Illness: Howard [name] is a 35 y.o. male with hx of alcoholic liver cirrhosis with ascites and esophageal varices. Admitted with nausea, vomiting, and diarrhea. Also c/o fever and chills and abdominal pain. Already started on Abx. Now having chills right now.  Past Medical History:  Past Medical History  Diagnosis Date  . Hypertension   . Schizophrenia (HCC)   . Hyperlipemia   . Depression   . Diabetes (HCC)   . Liver disease   . Diabetes mellitus, type II (HCC)   . Heart disease   . Alcoholic cirrhosis of liver with ascites (HCC)   . Esophageal varices (HCC)   . Portal hypertensive gastropathy     Problem List: Patient Active Problem List   Diagnosis Date Noted  . Fever 11/11/2015  . Ascites due to alcoholic cirrhosis (HCC) 11/11/2015  . Cirrhosis of liver with ascites (HCC) 11/11/2015  . Hepatic encephalopathy (HCC) 10/16/2015  . Type 2 diabetes mellitus (HCC) 10/16/2015  . Acute renal failure (ARF) (HCC) 07/30/2015  . 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 Hypoxia 07/04/2015  . Elevated transaminase level 07/04/2015  . DOE (dyspnea on exertion) 07/04/2015  . Alcohol abuse 05/31/2015  . Hypertension 05/29/2015  . Paranoid schizophrenia (HCC) 03/20/2015    Past Surgical History: Past Surgical History  Procedure Laterality Date  . No past surgeries    . Esophagogastroduodenoscopy (egd) with propofol N/A 08/03/2015    Procedure: ESOPHAGOGASTRODUODENOSCOPY (EGD) WITH PROPOFOL;  Surgeon: Elnita Maxwell, MD;  Location: [facility] ENDOSCOPY;  Service: Endoscopy;  Laterality: N/A;  . Esophagogastroduodenoscopy (egd) with propofol N/A 08/31/2015    Procedure: ESOPHAGOGASTRODUODENOSCOPY (EGD) WITH PROPOFOL;  Surgeon: Elnita Maxwell, MD;  Location: [facility] ENDOSCOPY;  Service: Endoscopy;  Laterality: N/A;  . Esophagogastroduodenoscopy N/A 10/05/2015   Procedure: ESOPHAGOGASTRODUODENOSCOPY (EGD);  Surgeon: Elnita Maxwell, MD;  Location: [facility] ENDOSCOPY;  Service: Endoscopy;  Laterality: N/A;    Allergies: No Known Allergies  Home Medications: Prescriptions prior to admission  Medication Sig Dispense Refill Last Dose  . albuterol (PROVENTIL HFA;VENTOLIN HFA) 108 (90 BASE) MCG/ACT inhaler Inhale 2 puffs into the lungs every 6 (six) hours as needed for wheezing or shortness of breath. Reported on 11/11/2015     . aspirin EC 81 MG tablet Take 81 mg by mouth daily.      . citalopram (CELEXA) 20 MG tablet Take 1 tablet (20 mg total) by mouth daily. 90 tablet 1   . diphenhydrAMINE (BENADRYL) 25 mg capsule TAKE 2 CAPSULES BY MOUTH AT BEDTIME 60 capsule 2   . furosemide (LASIX) 20 MG tablet Take 20 mg by mouth 2 (two) times daily. Reported on 11/11/2015     . lactulose (CHRONULAC) 10 GM/15ML solution Take 45 mLs (30 g total) by mouth 3 (three) times daily. 240 mL 0   . losartan-hydrochlorothiazide (HYZAAR) 100-25 MG tablet Take 1 tablet by mouth daily.    11/07/2015 at am  . metFORMIN (GLUCOPHAGE) 500 MG tablet Take 1 tablet (500 mg total) by mouth 2 (two) times daily with a meal. 60 tablet 3 11/07/2015 at am  . metoCLOPramide (REGLAN) 10 MG tablet Take 1 tablet (10 mg total) by mouth 3 (three) times daily before meals. 20 tablet 0 11/07/2015 at pm  . metoprolol succinate (TOPROL-XL) 100 MG 24 hr tablet Take 100 mg by mouth daily.    11/07/2015 at 0730  . Multiple Vitamin (MULTIVITAMIN  WITH MINERALS) TABS tablet Take 1 tablet by mouth daily. 30 tablet 0 11/07/2015 at am  . OLANZapine (ZYPREXA) 15 MG tablet Take 1 tablet (15 mg total) by mouth at bedtime. 90 tablet 1 11/06/2015 at pm  . omeprazole (PRILOSEC) 40 MG capsule Take 40 mg by mouth every morning.    11/07/2015 at am  . simvastatin (ZOCOR) 40 MG tablet Take 40 mg by mouth at bedtime.    11/06/2015 at pm   Home medication reconciliation was completed with the patient.   Scheduled Inpatient  Medications:   . sodium chloride   Intravenous Once  . cefTRIAXone (ROCEPHIN)  IV  2 g Intravenous Q24H  . citalopram  20 mg Oral Daily  . folic acid  1 mg Oral Daily  . metoCLOPramide  10 mg Oral TID AC  . multivitamin with minerals  1 tablet Oral Q supper  . OLANZapine  15 mg Oral QHS  . pantoprazole (PROTONIX) IV  40 mg Intravenous Q12H  . simvastatin  40 mg Oral QHS  . sodium chloride flush  3 mL Intravenous Q12H  . thiamine  100 mg Oral Daily  . vancomycin  1,000 mg Intravenous Q8H    Continuous Inpatient Infusions:   . sodium chloride 100 mL/hr at 11/11/15 0615    PRN Inpatient Medications:  albuterol, ondansetron (ZOFRAN) IV  Family History: family history includes Alcohol abuse in his father; Heart attack in his father; Heart disease in his brother, father, and mother; Hyperlipidemia in his mother; Hypertension in his father and mother; Stroke in his father.  The patient's family history is negative for inflammatory bowel disorders, GI malignancy, or solid organ transplantation.  Social History:   reports that he has never smoked. He has never used smokeless tobacco. He reports that he does not drink alcohol or use illicit drugs. The patient denies ETOH, tobacco, or drug use.   Review of Systems: Constitutional: Weight is stable.  Eyes: No changes in vision. ENT: No oral lesions, sore throat.  GI: see HPI.  Heme/Lymph: No easy bruising.  CV: No chest pain.  GU: No hematuria.  Integumentary: No rashes.  Neuro: No headaches.  Psych: No depression/anxiety.  Endocrine: No heat/cold intolerance.  Allergic/Immunologic: No urticaria.  Resp: No cough, SOB.  Musculoskeletal: No joint swelling.    Physical Examination: BP 102/46 mmHg  Pulse 99  Temp(Src) 99.7 F (37.6 C) (Oral)  Resp 17  Ht 6\' 3"  (1.905 m)  Wt 133.358 kg (294 lb)  BMI 36.75 kg/m2  SpO2 95% Gen: NAD, alert and oriented x 4 HEENT: PEERLA, EOMI, Neck: supple, no JVD or thyromegaly Chest: CTA  bilaterally, no wheezes, crackles, or other adventitious sounds CV: RRR, no m/g/c/r Abd: soft, distended with diffuse abdominal tenderness, +BS in all four quadrants; no HSM, guarding, ridigity, or rebound tenderness Ext: no edema, well perfused with 2+ pulses, Skin: no rash or lesions noted Lymph: no LAD  Data: Lab Results  Component Value Date   WBC 6.6 11/11/2015   HGB 9.8* 11/11/2015   HCT 29.1* 11/11/2015   MCV 93.8 11/11/2015   PLT 104* 11/11/2015    Recent Labs Lab 11/08/15 0528 11/10/15 1941 11/11/15 0616  HGB 9.6* 11.7* 9.8*   Lab Results  Component Value Date   NA 137 11/11/2015   K 4.2 11/11/2015   CL 107 11/11/2015   CO2 23 11/11/2015   BUN 42* 11/11/2015   CREATININE 2.21* 11/11/2015   Lab Results  Component Value Date   ALT 43  11/11/2015   AST 45* 11/11/2015   ALKPHOS 69 11/11/2015   BILITOT 1.5* 11/11/2015   No results for input(s): APTT, INR, PTT in the last 168 hours. Assessment/Plan: Howard [name] is a 35 y.o. male with chronic liver disease and now sepsis from SBP?Marland Kitchen    Recommendations: Agree with Abx coverage. Agree with holding lactulose and getting stool studies. With Cr elevated, may need to hold lasix for few days. Agree with getting diagnostic and therapeutic paracentesis.  Since pt has had multiple EGD's recently for esophageal banding, no need to repeat EGD at this time. Blood cultures if not done already.  Thank you for the consult. Please call with questions or concerns.  Shawnte Winton, Ezzard Standing, MD

## 2015-11-11 NOTE — Progress Notes (Signed)
Pharmacy Antibiotic Note  Howard [name] is a 35 y.o. male admitted on 11/10/2015 with peritonitis.  Pharmacy has been consulted for ceftriaxone and vancomycin dosing.  Plan: 1. Ceftriaxone 2 gm IV Q24H 2. Vancomycin 2 gm IV x 1 given in ED. Follow with 1 gm IV Q8H, predicted trough 15 mcg/mL. Pharmacy will continue to follow and adjust as needed to maintain trough 15 to 20 mcg/mL.  Vd 75.6 L, Ke 0.086 hr-1, T1/2 8.1 hr  Height:  (190.5 cm) Weight: 294 lb (133.358 kg) IBW/kg (Calculated) : 84.5  Temp (24hrs), Avg:100.4 F (38 C), Min:99.1 F (37.3 C), Max:102.7 F (39.3 C)   Recent Labs Lab 11/04/15 1925 11/07/15 1634 11/08/15 0528 11/10/15 1941 11/11/15 0240  WBC 7.1 6.3 5.4 10.3  --   CREATININE 1.30* 0.91 0.84 1.53*  --   LATICACIDVEN  --   --   --   --  1.1    Estimated Creatinine Clearance: 98.3 mL/min (by C-G formula based on Cr of 1.53).    No Known Allergies   Thank you for allowing pharmacy to be a part of this patient's care.  Carola Frost, Pharm.D., BCPS Clinical Pharmacist 11/11/2015 4:01 AM

## 2015-11-11 NOTE — Progress Notes (Signed)
[facility] - [place] at [facility]   PATIENT NAME: Howard [name]    MR#:  [id number]  DATE OF BIRTH:  Jun 04, 1981  SUBJECTIVE: Admitted for nausea vomiting diarrhea and fever. Patient has history of alcohol. Her cirrhosis with esophageal varices, portal hypertensive gastropathy. Complains of midepigastric pain 6 out of 10 in severity. No radiation to the back. Still has nausea, vomiting. No fever. On IV antibiotics for possible SBP.   CHIEF COMPLAINT:   Chief Complaint  Patient presents with  . Emesis  . Dizziness    REVIEW OF SYSTEMS:    Review of Systems  Constitutional: Negative for fever and chills.  HENT: Negative for hearing loss.   Eyes: Negative for blurred vision, double vision and photophobia.  Respiratory: Negative for cough, hemoptysis and shortness of breath.   Cardiovascular: Positive for leg swelling. Negative for palpitations and orthopnea.  Gastrointestinal: Positive for nausea, vomiting, abdominal pain and diarrhea.  Genitourinary: Negative for dysuria and urgency.  Musculoskeletal: Negative for myalgias and neck pain.  Skin: Negative for rash.  Neurological: Negative for dizziness, focal weakness, seizures, weakness and headaches.  Psychiatric/Behavioral: Negative for memory loss. The patient does not have insomnia.     Nutrition:  Tolerating Diet: Tolerating PT:      DRUG ALLERGIES:  No Known Allergies  VITALS:  Blood pressure 102/46, pulse 99, temperature 99.7 F (37.6 C), temperature source Oral, resp. rate 17, height  (1.905 m), weight 133.358 kg (294 lb), SpO2 95 %.  PHYSICAL EXAMINATION:   Physical Exam  GENERAL:  35 y.o.-year-old patient lying in the bed with no acute distress.  EYES: Pupils equal, round, reactive to light and accommodation. No scleral icterus. Extraocular muscles intact.  HEENT: Head atraumatic, normocephalic. Oropharynx and nasopharynx clear.  NECK:  Supple, no jugular venous distention. No  thyroid enlargement, no tenderness.  LUNGS: Normal breath sounds bilaterally, no wheezing, rales,rhonchi or crepitation. No use of accessory muscles of respiration.  CARDIOVASCULAR: S1, S2 normal. No murmurs, rubs, or gallops.  ABDOMEN: Midepigastric abdominal pain present.BS present.abdomen  Slightly distended,. EXTREMITIES: No pedal edema, cyanosis, or clubbing.  NEUROLOGIC: Cranial nerves II through XII are intact. Muscle strength 5/5 in all extremities. Sensation intact. Gait not checked.  PSYCHIATRIC: The patient is alert and oriented x 3.  SKIN: No obvious rash, lesion, or ulcer.    LABORATORY PANEL:   CBC  Recent Labs Lab 11/11/15 0616  WBC 6.6  HGB 9.8*  HCT 29.1*  PLT 104*   ------------------------------------------------------------------------------------------------------------------  Chemistries   Recent Labs Lab 11/11/15 0616  NA 137  K 4.2  CL 107  CO2 23  GLUCOSE 82  BUN 42*  CREATININE 2.21*  CALCIUM 8.6*  AST 45*  ALT 43  ALKPHOS 69  BILITOT 1.5*   ------------------------------------------------------------------------------------------------------------------  Cardiac Enzymes  Recent Labs Lab 11/05/15 0004  TROPONINI <0.03   ------------------------------------------------------------------------------------------------------------------  RADIOLOGY:  Dg Chest 2 View  11/11/2015  CLINICAL DATA:  Fever and tachypneic. Chest pain earlier today. No current pain. Nausea and vomiting today. Released from [facility] 2 days ago. Dizziness. EXAM: CHEST  2 VIEW COMPARISON:  11/04/2015 FINDINGS: Shallow inspiration with linear atelectasis in the lung bases. Normal heart size and pulmonary vascularity. No focal airspace disease or consolidation the lungs. No blunting of costophrenic angles. No pneumothorax. Mediastinal contours appear intact. Anterior wedging of a lower thoracic vertebra, probably T12. This was present previously.  IMPRESSION: Shallow inspiration with atelectasis in the lung bases. Electronically Signed  By: Burman Nieves M.D.   On: 11/11/2015 01:14     ASSESSMENT AND PLAN:   Principal Problem:   Ascites due to alcoholic cirrhosis (HCC) Active Problems:   Fever   Cirrhosis of liver with ascites (HCC)  #1 abdominal pain, nausea, vomiting likely due to acute gastritis: Start the patient on IV PPI, IV Zofran. Unable to exclude SBP so he is started on IV antibiotics. Appreciate GI consult. Not enough fluid for drainage as per CT.  Lipase normal on admission.  #2 fever likely secondary to gastroenteritis patient had diarrhea and nausea vomiting abdominal pain: Follow culture data, check stool for C. Difficile.,continue empiric abx #3 diarrhea hold off on lactulose, check the stool for C. difficile. #4 . Alcohol liver cirrhosis with portal hypertension: Blood pressure is soft and also  Low at times so we will hold the hctz,losartan and metoprolol. #5. Depression: Celexa, Zyprexa.    All the records are reviewed and case discussed with Care Management/Social Workerr. Management plans discussed with the patient, family and they are in agreement.  CODE STATUS: full  TOTAL TIME TAKING CARE OF THIS PATIENT: .   POSSIBLE D/C IN 2-3DAYS, DEPENDING ON CLINICAL CONDITION.   Katha Hamming M.D on 11/11/2015 at 9:41 AM  Between 7am to 6pm - Pager - [phone]  After 6pm go to [email or web address] - password [email or web address] [facility]  [place] [facility]  Office  [phone]  CC: Primary care physician; Imelda Pillow, NP

## 2015-11-11 NOTE — H&P (Addendum)
[facility] - [place] at [facility]   PATIENT NAME: Howard [name]    MR#:  [id number]  DATE OF BIRTH:  [birth year]  DATE OF ADMISSION:  11/10/2015  PRIMARY CARE PHYSICIAN: Imelda Pillow, NP   REQUESTING/REFERRING PHYSICIAN:   CHIEF COMPLAINT:   Chief Complaint  Patient presents with  . Emesis  . Dizziness    HISTORY OF PRESENT ILLNESS: Howard [name]  is a 35 y.o. male with a known history of hypertension, schizophrenia, alcoholic cirrhosis of liver, ascites, esophageal varices, portal hypertensive gastropathy presented to the emergency room for weakness and dizziness. He also had some vomiting. No history of any hematemesis or hemoptysis. Patient also had fever of 10 10F when he came to the ER. Patient was given broad-spectrum IV antibiotics vancomycin and Rocephin intravenously in the ER for possible bacterial peritonitis. Patient was recently discharged from the hospital after being managed for hepatic encephalopathy. No history of any loss of consciousness.No history of any seizure. No history of any rectal bleed. Stop drinking all call 1 month ago.Has abdominal pain , generalized, aching in nature.Pain is non radiating and 6/10 on a scale of 1 to 10.  PAST MEDICAL HISTORY:   Past Medical History  Diagnosis Date  . Hypertension   . Schizophrenia (HCC)   . Hyperlipemia   . Depression   . Diabetes (HCC)   . Liver disease   . Diabetes mellitus, type II (HCC)   . Heart disease   . Alcoholic cirrhosis of liver with ascites (HCC)   . Esophageal varices (HCC)   . Portal hypertensive gastropathy     PAST SURGICAL HISTORY: Past Surgical History  Procedure Laterality Date  . No past surgeries    . Esophagogastroduodenoscopy (egd) with propofol N/A 08/03/2015    Procedure: ESOPHAGOGASTRODUODENOSCOPY (EGD) WITH PROPOFOL;  Surgeon: Elnita Maxwell, MD;  Location: [facility] ENDOSCOPY;  Service: Endoscopy;  Laterality: N/A;  . Esophagogastroduodenoscopy (egd) with  propofol N/A 08/31/2015    Procedure: ESOPHAGOGASTRODUODENOSCOPY (EGD) WITH PROPOFOL;  Surgeon: Elnita Maxwell, MD;  Location: [facility] ENDOSCOPY;  Service: Endoscopy;  Laterality: N/A;  . Esophagogastroduodenoscopy N/A 10/05/2015    Procedure: ESOPHAGOGASTRODUODENOSCOPY (EGD);  Surgeon: Elnita Maxwell, MD;  Location: [facility] ENDOSCOPY;  Service: Endoscopy;  Laterality: N/A;    SOCIAL HISTORY:  Social History  Substance Use Topics  . Smoking status: Never Smoker   . Smokeless tobacco: Never Used  . Alcohol Use: No     Comment: last drank 1 month ago    FAMILY HISTORY:  Family History  Problem Relation Age of Onset  . Heart disease Mother   . Hypertension Mother   . Hyperlipidemia Mother   . Stroke Father   . Heart attack Father   . Hypertension Father   . Heart disease Father   . Alcohol abuse Father   . Heart disease Brother     DRUG ALLERGIES: No Known Allergies  REVIEW OF SYSTEMS:   CONSTITUTIONAL: Has fever, has weakness.  EYES: No blurred or double vision.  EARS, NOSE, AND THROAT: No tinnitus or ear pain.  RESPIRATORY: No cough, shortness of breath, wheezing or hemoptysis.  CARDIOVASCULAR: No chest pain, orthopnea, edema.  GASTROINTESTINAL: Has nausea, vomiting, no diarrhea, has abdominal pain.  GENITOURINARY: No dysuria, hematuria.  ENDOCRINE: No polyuria, nocturia,  HEMATOLOGY: No anemia, easy bruising or bleeding SKIN: No rash or lesion. MUSCULOSKELETAL: No joint pain or arthritis.   NEUROLOGIC: No tingling, numbness, weakness.  PSYCHIATRY: No anxiety or depression.   MEDICATIONS AT  HOME:  Prior to Admission medications   Medication Sig Start Date End Date Taking? Authorizing Provider  albuterol (PROVENTIL HFA;VENTOLIN HFA) 108 (90 BASE) MCG/ACT inhaler Inhale 2 puffs into the lungs every 6 (six) hours as needed for wheezing or shortness of breath.     Historical Provider, MD  aspirin EC 81 MG tablet Take 81 mg by mouth daily.     Historical Provider, MD   citalopram (CELEXA) 20 MG tablet Take 1 tablet (20 mg total) by mouth daily. 10/16/15   Audery Amel, MD  diphenhydrAMINE (BENADRYL) 25 mg capsule TAKE 2 CAPSULES BY MOUTH AT BEDTIME 10/03/15   Audery Amel, MD  furosemide (LASIX) 20 MG tablet Take 20 mg by mouth 2 (two) times daily.    Historical Provider, MD  lactulose (CHRONULAC) 10 GM/15ML solution Take 45 mLs (30 g total) by mouth 3 (three) times daily. 11/09/15   Auburn Bilberry, MD  losartan-hydrochlorothiazide (HYZAAR) 100-25 MG tablet Take 1 tablet by mouth daily.  08/06/15   Historical Provider, MD  metFORMIN (GLUCOPHAGE) 500 MG tablet Take 1 tablet (500 mg total) by mouth 2 (two) times daily with a meal. 06/01/15   Jolanta B Pucilowska, MD  metoCLOPramide (REGLAN) 10 MG tablet Take 1 tablet (10 mg total) by mouth 3 (three) times daily before meals. 11/05/15 11/04/16  Rebecka Apley, MD  metoprolol succinate (TOPROL-XL) 100 MG 24 hr tablet Take 100 mg by mouth daily.  08/04/15   Historical Provider, MD  Multiple Vitamin (MULTIVITAMIN WITH MINERALS) TABS tablet Take 1 tablet by mouth daily. 08/04/15   Ramonita Lab, MD  OLANZapine (ZYPREXA) 15 MG tablet Take 1 tablet (15 mg total) by mouth at bedtime. 10/16/15   Audery Amel, MD  omeprazole (PRILOSEC) 40 MG capsule Take 40 mg by mouth every morning.     Historical Provider, MD  simvastatin (ZOCOR) 40 MG tablet Take 40 mg by mouth at bedtime.     Historical Provider, MD      PHYSICAL EXAMINATION:   VITAL SIGNS: Blood pressure 116/64, pulse 104, temperature 102.7 F (39.3 C), temperature source Oral, resp. rate 18, height  (1.905 m), weight 133.358 kg (294 lb), SpO2 99 %.  GENERAL:  35 y.o.-year-old patient lying in the bed with no acute distress.  EYES: Pupils equal, round, reactive to light and accommodation. No scleral icterus. Extraocular muscles intact.  HEENT: Head atraumatic, normocephalic. Oropharynx dry and nasopharynx clear.  NECK:  Supple, no jugular venous distention.  No thyroid enlargement, no tenderness.  LUNGS: Normal breath sounds bilaterally, no wheezing, rales,rhonchi or crepitation. No use of accessory muscles of respiration.  CARDIOVASCULAR: S1, S2 normal. No murmurs, rubs, or gallops.  ABDOMEN: Soft, tenderness around umbilicus, distended,ascites noted. Bowel sounds present. No organomegaly or mass.  EXTREMITIES: No pedal edema, cyanosis, or clubbing.  NEUROLOGIC: Cranial nerves II through XII are intact. Muscle strength 5/5 in all extremities. Sensation intact. Gait normal.  PSYCHIATRIC: The patient is alert and oriented x 3.  SKIN: No obvious rash, lesion, or ulcer.   LABORATORY PANEL:   CBC  Recent Labs Lab 11/04/15 1925 11/07/15 1634 11/08/15 0528 11/10/15 1941  WBC 7.1 6.3 5.4 10.3  HGB 10.1* 10.4* 9.6* 11.7*  HCT 30.0* 30.8* 28.4* 35.2*  PLT 133* 135* 119* 169  MCV 95.0 94.5 93.1 95.8  MCH 32.0 31.8 31.5 31.9  MCHC 33.6 33.6 33.9 33.3  RDW 15.2* 15.1* 15.1* 15.0*   ------------------------------------------------------------------------------------------------------------------  Chemistries   Recent Labs Lab 11/04/15 1925 11/07/15  1634 11/08/15 0528 11/10/15 1941  NA 141 139 141 136  K 3.9 4.0 3.7 4.8  CL 113* 108 109 106  CO2 21* 23 24 24   GLUCOSE 137* 116* 79 132*  BUN 22* 17 17 34*  CREATININE 1.30* 0.91 0.84 1.53*  CALCIUM 9.0 9.4 9.2 8.9  AST  --  63*  --  54*  ALT  --  61  --  52  ALKPHOS  --  117  --  96  BILITOT  --  0.8  --  1.6*   ------------------------------------------------------------------------------------------------------------------ estimated creatinine clearance is 98.3 mL/min (by C-G formula based on Cr of 1.53). ------------------------------------------------------------------------------------------------------------------ No results for input(s): TSH, T4TOTAL, T3FREE, THYROIDAB in the last 72 hours.  Invalid input(s): FREET3   Coagulation profile No results for input(s): INR,  PROTIME in the last 168 hours. ------------------------------------------------------------------------------------------------------------------- No results for input(s): DDIMER in the last 72 hours. -------------------------------------------------------------------------------------------------------------------  Cardiac Enzymes  Recent Labs Lab 11/04/15 1925 11/05/15 0004  TROPONINI <0.03 <0.03   ------------------------------------------------------------------------------------------------------------------ Invalid input(s): POCBNP  ---------------------------------------------------------------------------------------------------------------  Urinalysis    Component Value Date/Time   COLORURINE AMBER* 11/10/2015 1941   COLORURINE Yellow 12/25/2014 1404   APPEARANCEUR CLOUDY* 11/10/2015 1941   APPEARANCEUR Clear 12/25/2014 1404   LABSPEC 1.025 11/10/2015 1941   LABSPEC 1.016 12/25/2014 1404   PHURINE 5.0 11/10/2015 1941   PHURINE 5.0 12/25/2014 1404   GLUCOSEU NEGATIVE 11/10/2015 1941   GLUCOSEU Negative 12/25/2014 1404   HGBUR 3+* 11/10/2015 1941   HGBUR 3+ 12/25/2014 1404   BILIRUBINUR NEGATIVE 11/10/2015 1941   BILIRUBINUR Negative 12/25/2014 1404   KETONESUR NEGATIVE 11/10/2015 1941   KETONESUR Negative 12/25/2014 1404   PROTEINUR 30* 11/10/2015 1941   PROTEINUR 30 mg/dL 16/06/9603 5409   NITRITE NEGATIVE 11/10/2015 1941   NITRITE Negative 12/25/2014 1404   LEUKOCYTESUR NEGATIVE 11/10/2015 1941   LEUKOCYTESUR Negative 12/25/2014 1404     RADIOLOGY: Dg Chest 2 View  11/11/2015  CLINICAL DATA:  Fever and tachypneic. Chest pain earlier today. No current pain. Nausea and vomiting today. Released from [facility] 2 days ago. Dizziness. EXAM: CHEST  2 VIEW COMPARISON:  11/04/2015 FINDINGS: Shallow inspiration with linear atelectasis in the lung bases. Normal heart size and pulmonary vascularity. No focal airspace disease or consolidation the  lungs. No blunting of costophrenic angles. No pneumothorax. Mediastinal contours appear intact. Anterior wedging of a lower thoracic vertebra, probably T12. This was present previously. IMPRESSION: Shallow inspiration with atelectasis in the lung bases. Electronically Signed   By: Burman Nieves M.D.   On: 11/11/2015 01:14    EKG: Orders placed or performed during the hospital encounter of 11/10/15  . EKG 12-Lead  . EKG 12-Lead  . ED EKG  . ED EKG    IMPRESSION AND PLAN: 35 year old male patient with history of cirrhosis of liver, portal gastropathy, esophageal varices, ascites presented to the emergency room with abdominal pain, fever and ascites. Admitting diagnosis 1. Decompensated liver disease 2. Cirrhosis of liver 3. Ascites 4. Portal gastropathy 5. Fever with possible spontaneous bacterial peritonitis Treatment plan Admit patient to medical floor IV vancomycin and IV Rocephin antibiotics Ultrasound-guided paracentesis with fluid for culture Hold blood thinner medication Gentle IV fluid hydration Continue oral lactulose Supportive care. All the records are reviewed and case discussed with ED provider. Management plans discussed with the patient, family and they are in agreement.  CODE STATUS:FULL Code Status History    Date Active Date Inactive Code Status Order ID Comments User Context   11/07/2015  10:02 PM 11/09/2015  4:56 PM Full Code [id number]  Enid Baas, MD Inpatient   10/28/2015  8:14 PM 10/29/2015  2:26 PM Full Code [id number]  Altamese Dilling, MD Inpatient   10/16/2015 11:47 PM 10/17/2015  6:00 PM Full Code [id number]  Oralia Manis, MD Inpatient   07/30/2015  5:35 PM 08/04/2015  8:34 PM Full Code [id number]  Enedina Finner, MD Inpatient   07/05/2015  1:16 AM 07/08/2015  7:22 PM Full Code [id number]  Oralia Manis, MD Inpatient   05/30/2015  7:28 PM 06/01/2015  3:36 PM Full Code [id number]  Audery Amel, MD Inpatient       TOTAL TIME TAKING CARE OF THIS PATIENT: 50  minutes.    Ihor Austin M.D on 11/11/2015 at 3:51 AM  Between 7am to 6pm - Pager - [phone]  After 6pm go to [email or web address] - password [email or web address] [facility]  [place] [facility]  Office  [phone]  CC: Primary care physician; Imelda Pillow, NP

## 2015-11-11 NOTE — ED Notes (Signed)
Patient transported to X-ray 

## 2015-11-11 NOTE — ED Notes (Signed)
Patient transported to room 238 

## 2015-11-11 NOTE — ED Notes (Signed)
-  pt reports he stopped drinking alcohol approx 1 month ago after drinking for more than 12 years. Pt reports he was hospitalized for elevated ammonia levels this week and discharged home on Friday. Pt reports he was feeling good when he went to bed Friday evening but around 3 am on Saturday morning he started with n/v/d. Pt reports he has vomited 8 times and had more than  He can remember episodes of diarrhea.

## 2015-11-11 NOTE — Progress Notes (Signed)
MD Dr. Tobi Bastos notified of consistently low BP. Orders given to increase NS from 19mL/h to 100 mL/h. RN will continue to monitor. Syliva Overman, RN

## 2015-11-11 NOTE — Progress Notes (Signed)
Pt admitted to room 238. A&Ox4, BP running low, VS otherwise stable, no complaints of pain or nausea at this time. Pt oriented to unit and room, educated on call bell, telephone, bed alarm, and need to call nursing before up out of bed. Telemetry verified with Marchelle Folks, NT. Skin assessed with Diannia Ruder, RN. RN will continue to monitor and treat per MD orders. Syliva Overman, RN

## 2015-11-12 ENCOUNTER — Inpatient Hospital Stay: Payer: Medicare Other

## 2015-11-12 LAB — VANCOMYCIN, TROUGH: VANCOMYCIN TR: 13 ug/mL (ref 10–20)

## 2015-11-12 MED ORDER — METOPROLOL TARTRATE 25 MG PO TABS
25.0000 mg | ORAL_TABLET | Freq: Two times a day (BID) | ORAL | Status: DC
  Administered 2015-11-12 – 2015-11-13 (×3): 25 mg via ORAL
  Filled 2015-11-12 (×3): qty 1

## 2015-11-12 NOTE — Consult Note (Signed)
  GI Inpatient Follow-up Note  Patient Identification: Howard [name] is a 35 y.o. male  Subjective: Feels much better today. Fever finally broke. Not enough ascites to drain. No more nausea/vomiting.  Scheduled Inpatient Medications:  . cefTRIAXone (ROCEPHIN)  IV  2 g Intravenous Q24H  . citalopram  20 mg Oral Daily  . folic acid  1 mg Oral Daily  . metoCLOPramide  10 mg Oral TID AC  . metoprolol tartrate  25 mg Oral BID  . multivitamin with minerals  1 tablet Oral Q supper  . OLANZapine  15 mg Oral QHS  . pantoprazole (PROTONIX) IV  40 mg Intravenous Q12H  . simvastatin  40 mg Oral QHS  . sodium chloride flush  3 mL Intravenous Q12H  . thiamine  100 mg Oral Daily  . vancomycin  1,000 mg Intravenous Q8H    Continuous Inpatient Infusions:   . sodium chloride 50 mL/hr at 11/12/15 0955    PRN Inpatient Medications:  albuterol, ondansetron (ZOFRAN) IV  Review of Systems: Constitutional: Weight is stable.  Eyes: No changes in vision. ENT: No oral lesions, sore throat.  GI: see HPI.  Heme/Lymph: No easy bruising.  CV: No chest pain.  GU: No hematuria.  Integumentary: No rashes.  Neuro: No headaches.  Psych: No depression/anxiety.  Endocrine: No heat/cold intolerance.  Allergic/Immunologic: No urticaria.  Resp: No cough, SOB.  Musculoskeletal: No joint swelling.    Physical Examination: BP 121/67 mmHg  Pulse 82  Temp(Src) 98.6 F (37 C) (Oral)  Resp 18  Ht  (1.905 m)  Wt 133.358 kg (294 lb)  BMI 36.75 kg/m2  SpO2 97% Gen: NAD, alert and oriented x 4 HEENT: PEERLA, EOMI, Neck: supple, no JVD or thyromegaly Chest: CTA bilaterally, no wheezes, crackles, or other adventitious sounds CV: RRR, no m/g/c/r Abd: soft, NT, ND, +BS in all four quadrants; no HSM, guarding, ridigity, or rebound tenderness Ext: no edema, well perfused with 2+ pulses, Skin: no rash or lesions noted Lymph: no LAD  Data: Lab Results  Component Value Date   WBC 6.6 11/11/2015   HGB  9.8* 11/11/2015   HCT 29.1* 11/11/2015   MCV 93.8 11/11/2015   PLT 104* 11/11/2015    Recent Labs Lab 11/08/15 0528 11/10/15 1941 11/11/15 0616  HGB 9.6* 11.7* 9.8*   Lab Results  Component Value Date   NA 137 11/11/2015   K 4.2 11/11/2015   CL 107 11/11/2015   CO2 23 11/11/2015   BUN 42* 11/11/2015   CREATININE 2.21* 11/11/2015   Lab Results  Component Value Date   ALT 43 11/11/2015   AST 45* 11/11/2015   ALKPHOS 69 11/11/2015   BILITOT 1.5* 11/11/2015   No results for input(s): APTT, INR, PTT in the last 168 hours. Assessment/Plan: [name] is a 35 y.o. male with sepsis. Possible SBP. Symptoms have resolved.  Recommendations: If tolerates solids, may be able to go home soon on oral Abx. Would resume lactulose at least at a lower dose before discharge to prevent recurrence of hepatic encephalopathy. I will be in [employer] [place] and [place] on Wed. If there are questions, please contact me. Thanks  Shanti Agresti, Ezzard Standing, MD

## 2015-11-12 NOTE — Progress Notes (Signed)
[facility] -  at [facility]   PATIENT NAME: Howard [name]    MR#:  [id number]  DATE OF BIRTH:  1981-04-07  SUBJECTIVE: Admitted for nausea vomiting diarrhea and fever. Patient has history of alcohol. Her cirrhosis with esophageal varices, portal hypertensive gastropathy.   Pt  is feeling better today. No abdominal pain or nausea or vomiting. Tolerating clear liquids. Unable to get paracentesis because no sizable pocket collection for paracentesis.. No further diarrhea.   CHIEF COMPLAINT:   Chief Complaint  Patient presents with  . Emesis  . Dizziness    REVIEW OF SYSTEMS:    Review of Systems  Constitutional: Negative for fever and chills.  HENT: Negative for hearing loss.   Eyes: Negative for blurred vision, double vision and photophobia.  Respiratory: Negative for cough, hemoptysis and shortness of breath.   Cardiovascular: Negative for palpitations, orthopnea and leg swelling.  Gastrointestinal: Negative for nausea, vomiting, abdominal pain and diarrhea.  Genitourinary: Negative for dysuria and urgency.  Musculoskeletal: Negative for myalgias and neck pain.  Skin: Negative for rash.  Neurological: Negative for dizziness, focal weakness, seizures, weakness and headaches.  Psychiatric/Behavioral: Negative for memory loss. The patient does not have insomnia.     Nutrition:  Tolerating Diet: Tolerating PT:      DRUG ALLERGIES:  No Known Allergies  VITALS:  Blood pressure 118/69, pulse 84, temperature 98.7 F (37.1 C), temperature source Oral, resp. rate 18, height  (1.905 m), weight 133.358 kg (294 lb), SpO2 97 %.  PHYSICAL EXAMINATION:   Physical Exam  GENERAL:  35 y.o.-year-old patient lying in the bed with no acute distress.  EYES: Pupils equal, round, reactive to light and accommodation. No scleral icterus. Extraocular muscles intact.  HEENT: Head atraumatic, normocephalic. Oropharynx and nasopharynx clear.  NECK:  Supple,  no jugular venous distention. No thyroid enlargement, no tenderness.  LUNGS: Normal breath sounds bilaterally, no wheezing, rales,rhonchi or crepitation. No use of accessory muscles of respiration.  CARDIOVASCULAR: S1, S2 normal. No murmurs, rubs, or gallops.  ABDOMEN: Soft nontender nondistended bowel sounds present  EXTREMITIES: No pedal edema, cyanosis, or clubbing.  NEUROLOGIC: Cranial nerves II through XII are intact. Muscle strength 5/5 in all extremities. Sensation intact. Gait not checked.  PSYCHIATRIC: The patient is alert and oriented x 3.  SKIN: No obvious rash, lesion, or ulcer.    LABORATORY PANEL:   CBC  Recent Labs Lab 11/11/15 0616  WBC 6.6  HGB 9.8*  HCT 29.1*  PLT 104*   ------------------------------------------------------------------------------------------------------------------  Chemistries   Recent Labs Lab 11/11/15 0616  NA 137  K 4.2  CL 107  CO2 23  GLUCOSE 82  BUN 42*  CREATININE 2.21*  CALCIUM 8.6*  AST 45*  ALT 43  ALKPHOS 69  BILITOT 1.5*   ------------------------------------------------------------------------------------------------------------------  Cardiac Enzymes No results for input(s): TROPONINI in the last 168 hours. ------------------------------------------------------------------------------------------------------------------  RADIOLOGY:  Dg Chest 2 View  11/11/2015  CLINICAL DATA:  Fever and tachypneic. Chest pain earlier today. No current pain. Nausea and vomiting today. Released from [facility] 2 days ago. Dizziness. EXAM: CHEST  2 VIEW COMPARISON:  11/04/2015 FINDINGS: Shallow inspiration with linear atelectasis in the lung bases. Normal heart size and pulmonary vascularity. No focal airspace disease or consolidation the lungs. No blunting of costophrenic angles. No pneumothorax. Mediastinal contours appear intact. Anterior wedging of a lower thoracic vertebra, probably T12. This was present  previously. IMPRESSION: Shallow inspiration with atelectasis in the lung bases. Electronically Signed  By: Burman Nieves M.D.   On: 11/11/2015 01:14   US Abdomen Limited  11/12/2015  CLINICAL DATA:  Possible ascites EXAM: LIMITED ABDOMEN ULTRASOUND FOR ASCITES TECHNIQUE: Limited ultrasound survey for ascites was performed in all four abdominal quadrants. COMPARISON:  11/05/2015 FINDINGS: Sonographic evaluation in all 4 quadrants reveals a trace amount of fluid within the abdomen although no sizable pocket to allow for safe paracentesis is noted. IMPRESSION: Trace free fluid.  No safe pocket for paracentesis is noted. Electronically Signed   By: Alcide Clever M.D.   On: 11/12/2015 09:31     ASSESSMENT AND PLAN:   Principal Problem:   Ascites due to alcoholic cirrhosis (HCC) Active Problems:   Fever   Cirrhosis of liver with ascites (HCC)  #1 abdominal pain, nausea, vomiting likely due to acute gastritis and SBP?: Start the patient on IV PPI, IV Zofran. Unable to exclude SBP so he is started on IV antibiotics. Appreciate GI consult. Not enough fluid for drainage as per CT. and also ultrasound. His nausea improved abdominal pain improved tolerating the diet so I will decrease IV fluids. Advance  diet to soft diet and then take his to regular as tolerated. Lipase normal on admission.  #2 fever and possible sepsis, possible SBP unable to do paracentesis as per radiology report. But continue IV Rocephin.  Diarrhea resolved after stopping lactulose. If persistent diarrhea then we'll do stool for C. difficile.   #4 . Alcohol liver cirrhosis with portal hypertension: Resolved hypotension. Restart the metoprolol  #5. Depression: Celexa, Zyprexa.  #Diabetes mellitus type 2: Hold metformin secondary to acute renal failure.  #7 acute renal failure with   ZOX:WRUEAV due to sepsis dehydration with nausea vomiting diarrhea: monitor kidney function closely  All the records are reviewed and case  discussed with Care Management/Social Workerr. Management plans discussed with the patient, family and they are in agreement.  CODE STATUS: full  TOTAL TIME TAKING CARE OF THIS PATIENT: .   POSSIBLE D/C IN 2-3DAYS, DEPENDING ON CLINICAL CONDITION.   Katha Hamming M.D on 11/12/2015 at 10:33 AM  Between 7am to 6pm - Pager - [phone]  After 6pm go to [email or web address] - password [email or web address] [facility]  [facility]  Office  [phone]  CC: Primary care physician; Imelda Pillow, NP

## 2015-11-12 NOTE — Progress Notes (Signed)
Report called to [facility] RN, Josh. Patient will transfer to room 219. Belongings packed and will call orderly now. Mother is at bedside and updated with room number.

## 2015-11-12 NOTE — Progress Notes (Signed)
Patient ID: Howard [name], male   DOB: 1981-07-27, 35 y.o.   MRN: [id number] Patient seen for possible diagnostic paracentesis but has only trace fluid in lower abdomen.  No sizable pocket to allow for safe paracentesis noted.

## 2015-11-12 NOTE — Progress Notes (Signed)
Pharmacy Antibiotic Note  Howard [name] is a 35 y.o. male admitted on 11/10/2015 with possible SBP.  Pharmacy has been consulted for Vancomycin and Ceftriaxone dosing.  Plan: After discussion with Dr. Luberta Mutter, antibiotics will be changed to Ceftriaxone only. Will discontinue Vancomycin.  Height:  (190.5 cm) Weight: 294 lb (133.358 kg) IBW/kg (Calculated) : 84.5  Temp (24hrs), Avg:98.7 F (37.1 C), Min:98.6 F (37 C), Max:98.8 F (37.1 C)   Recent Labs Lab 11/07/15 1634 11/08/15 0528 11/10/15 1941 11/11/15 0240 11/11/15 0616 11/12/15 1124  WBC 6.3 5.4 10.3  --  6.6  --   CREATININE 0.91 0.84 1.53*  --  2.21*  --   LATICACIDVEN  --   --   --  1.1 1.0  --   VANCOTROUGH  --   --   --   --   --  13    Estimated Creatinine Clearance: 68 mL/min (by C-G formula based on Cr of 2.21).    No Known Allergies  Antimicrobials this admission: Vancomycin 2/19 >> 2/20 Ceftriaxone 2/19 >>    Dose adjustments this admission:   Microbiology results: 2/19 BCx: pending 2/19 UCx: pending   Thank you for allowing pharmacy to be a part of this patient's care.  Clovia Cuff, PharmD, BCPS 11/12/2015 2:11 PM

## 2015-11-13 LAB — URINE CULTURE
Culture: NO GROWTH
Special Requests: NORMAL

## 2015-11-13 LAB — BASIC METABOLIC PANEL
ANION GAP: 7 (ref 5–15)
BUN: 20 mg/dL (ref 6–20)
CALCIUM: 8.3 mg/dL — AB (ref 8.9–10.3)
CHLORIDE: 109 mmol/L (ref 101–111)
CO2: 23 mmol/L (ref 22–32)
Creatinine, Ser: 1.1 mg/dL (ref 0.61–1.24)
GFR calc non Af Amer: >60 mL/min (ref >60)
Glucose, Bld: 90 mg/dL (ref 65–99)
POTASSIUM: 4.1 mmol/L (ref 3.5–5.1)
Sodium: 139 mmol/L (ref 135–145)

## 2015-11-13 MED ORDER — PANTOPRAZOLE SODIUM 40 MG PO TBEC: 40.0000 mg | DELAYED_RELEASE_TABLET | Freq: Two times a day (BID) | ORAL | Status: DC

## 2015-11-13 MED ORDER — METFORMIN HCL 500 MG PO TABS: 500.0000 mg | ORAL_TABLET | Freq: Two times a day (BID) | ORAL | Status: DC

## 2015-11-13 MED ORDER — LACTULOSE 10 GM/15ML PO SOLN: 30.0000 g | Freq: Two times a day (BID) | ORAL | Status: DC

## 2015-11-13 MED ORDER — METOPROLOL TARTRATE 25 MG PO TABS: 25.0000 mg | ORAL_TABLET | Freq: Two times a day (BID) | ORAL | Status: DC

## 2015-11-13 MED ORDER — LEVOFLOXACIN 500 MG PO TABS: 500.0000 mg | ORAL_TABLET | Freq: Every day | ORAL | Status: DC

## 2015-11-13 NOTE — Progress Notes (Signed)
[facility] [place] at [facility]   PATIENT NAME: Howard [name]    MR#:  [id number]  DATE OF BIRTH:  1980-12-25  SUBJECTIVE: seen today, abdominal pain, nausea, no vomiting.   CHIEF COMPLAINT:   Chief Complaint  Patient presents with  . Emesis  . Dizziness    REVIEW OF SYSTEMS:    Review of Systems  Constitutional: Negative for fever and chills.  HENT: Negative for hearing loss.   Eyes: Negative for blurred vision, double vision and photophobia.  Respiratory: Negative for cough, hemoptysis and shortness of breath.   Cardiovascular: Negative for palpitations, orthopnea and leg swelling.  Gastrointestinal: Negative for nausea, vomiting, abdominal pain and diarrhea.  Genitourinary: Negative for dysuria and urgency.  Musculoskeletal: Negative for myalgias and neck pain.  Skin: Negative for rash.  Neurological: Negative for dizziness, focal weakness, seizures, weakness and headaches.  Psychiatric/Behavioral: Negative for memory loss. The patient does not have insomnia.     Nutrition:  Tolerating Diet: Tolerating PT:      DRUG ALLERGIES:  No Known Allergies  VITALS:  Blood pressure 134/70, pulse 76, temperature 98.6 F (37 C), temperature source Oral, resp. rate 20, height  (1.905 m), weight 133.358 kg (294 lb), SpO2 98 %.  PHYSICAL EXAMINATION:   Physical Exam  GENERAL:  35 y.o.-year-old patient lying in the bed with no acute distress.  EYES: Pupils equal, round, reactive to light and accommodation. No scleral icterus. Extraocular muscles intact.  HEENT: Head atraumatic, normocephalic. Oropharynx and nasopharynx clear.  NECK:  Supple, no jugular venous distention. No thyroid enlargement, no tenderness.  LUNGS: Normal breath sounds bilaterally, no wheezing, rales,rhonchi or crepitation. No use of accessory muscles of respiration.  CARDIOVASCULAR: S1, S2 normal. No murmurs, rubs, or gallops.  ABDOMEN: Soft nontender nondistended bowel  sounds present  EXTREMITIES: No pedal edema, cyanosis, or clubbing.  NEUROLOGIC: Cranial nerves II through XII are intact. Muscle strength 5/5 in all extremities. Sensation intact. Gait not checked.  PSYCHIATRIC: The patient is alert and oriented x 3.  SKIN: No obvious rash, lesion, or ulcer.    LABORATORY PANEL:   CBC  Recent Labs Lab 11/11/15 0616  WBC 6.6  HGB 9.8*  HCT 29.1*  PLT 104*   ------------------------------------------------------------------------------------------------------------------  Chemistries   Recent Labs Lab 11/11/15 0616 11/13/15 0502  NA 137 139  K 4.2 4.1  CL 107 109  CO2 23 23  GLUCOSE 82 90  BUN 42* 20  CREATININE 2.21* 1.10  CALCIUM 8.6* 8.3*  AST 45*  --   ALT 43  --   ALKPHOS 69  --   BILITOT 1.5*  --    ------------------------------------------------------------------------------------------------------------------  Cardiac Enzymes No results for input(s): TROPONINI in the last 168 hours. ------------------------------------------------------------------------------------------------------------------  RADIOLOGY:  US Abdomen Limited  11/12/2015  CLINICAL DATA:  Possible ascites EXAM: LIMITED ABDOMEN ULTRASOUND FOR ASCITES TECHNIQUE: Limited ultrasound survey for ascites was performed in all four abdominal quadrants. COMPARISON:  11/05/2015 FINDINGS: Sonographic evaluation in all 4 quadrants reveals a trace amount of fluid within the abdomen although no sizable pocket to allow for safe paracentesis is noted. IMPRESSION: Trace free fluid.  No safe pocket for paracentesis is noted. Electronically Signed   By: Alcide Clever M.D.   On: 11/12/2015 09:31     ASSESSMENT AND PLAN:   Principal Problem:   Ascites due to alcoholic cirrhosis (HCC) Active Problems:   Fever   Cirrhosis of liver with ascites (HCC)  #1 abdominal pain, nausea, vomiting likely due  to acute gastritis and SBP; discharged with Levaquin, PPIs. #2 fever and  possible sepsis, possible SBP unable to do paracentesis as per radiology report. Discharge home with Levaquin. Diarrhea resolved after stopping lactulose. Restart  lactulose from tomorrow. #4 . Alcohol liver cirrhosis with portal hypertension : Resolved hypotension. Restarted  the metoprolol  #5. Depression: Celexa, Zyprexa.  #Diabetes mellitus type 2:   #7 acute renal failure with   ZOX:WRUEAV due to sepsis dehydration with nausea vomiting diarrhea: Improved. Restart the metformin from tomorrow.  Discharge home today.  All the records are reviewed and case discussed with Care Management/Social Workerr. Management plans discussed with the patient, family and they are in agreement.  CODE STATUS: full  TOTAL TIME TAKING CARE OF THIS PATIENT: .   POSSIBLE D/C IN 2-3DAYS, DEPENDING ON CLINICAL CONDITION.   Katha Hamming M.D on 11/13/2015 at 1:23 PM  Between 7am to 6pm - Pager - [phone]  After 6pm go to [email or web address] - password [email or web address] [facility]  [place] [facility]  Office  [phone]  CC: Primary care physician; Imelda Pillow, NP

## 2015-11-13 NOTE — Care Management Note (Signed)
Case Management Note  Patient Details  Name: SARGON SCOUTEN MRN: [id number] Date of Birth: 1980/12/31  Subjective/Objective:                 Patient admitted from home with decompensated liver disease.  Patient lives at home with his mother and brother.  Mother provides transportation for patient.  Patient has chronic O2 through [facility].  Mother to bring portable tank for time of discharge. Patient was arrange with PT and [facility] with [facility] at previous discharge on 11/10/15.  I discussed the case Dr Judithann Sheen who agrees that the patient is appropriate for [facility] services.  Will need resumption of home health care orders at time of discharge and patient could benefit from SW services also added.     Action/Plan:   Expected Discharge Date:                  Expected Discharge Plan:     In-House Referral:     Discharge planning Services     Post Acute Care Choice:    Choice offered to:     DME Arranged:    DME Agency:     HH Arranged:    HH Agency:     Status of Service:     Medicare Important Message Given:  Yes Date Medicare IM Given:    Medicare IM give by:    Date Additional Medicare IM Given:    Additional Medicare Important Message give by:     If discussed at Long Length of Stay Meetings, dates discussed:    Additional Comments:  Chapman Fitch, RN 11/13/2015, 10:44 AM

## 2015-11-13 NOTE — Discharge Summary (Signed)
Howard [name], is a 35 y.o. male  DOB 03/20/1981  MRN [id number].  Admission date:  11/10/2015  Admitting Physician  Ihor Austin, MD  Discharge Date:  11/13/2015   Primary MD  Imelda Pillow, NP  Recommendations for primary care physician for things to follow:   Follow up with PMD in one week  Admission Diagnosis  Rigors [R68.89] SIRS (systemic inflammatory response syndrome) (HCC) [R65.10] Alcoholic cirrhosis of liver with ascites (HCC) [K70.31] Elevated transaminase level [R74.0] Increased ammonia level [R79.89] Ascites [R18.8] Abdominal pain, unspecified abdominal location [R10.9] Fever, unspecified fever cause [R50.9] Nausea and vomiting, vomiting of unspecified type [R11.2] Diarrhea, unspecified type [R19.7]   Discharge Diagnosis  Rigors [R68.89] SIRS (systemic inflammatory response syndrome) (HCC) [R65.10] Alcoholic cirrhosis of liver with ascites (HCC) [K70.31] Elevated transaminase level [R74.0] Increased ammonia level [R79.89] Ascites [R18.8] Abdominal pain, unspecified abdominal location [R10.9] Fever, unspecified fever cause [R50.9] Nausea and vomiting, vomiting of unspecified type [R11.2] Diarrhea, unspecified type [R19.7]   Principal Problem:   Ascites due to alcoholic cirrhosis (HCC) Active Problems:   Fever   Cirrhosis of liver with ascites [facility])      Past Medical History  Diagnosis Date  . Hypertension   . Schizophrenia (HCC)   . Hyperlipemia   . Depression   . Diabetes (HCC)   . Liver disease   . Diabetes mellitus, type II (HCC)   . Heart disease   . Alcoholic cirrhosis of liver with ascites (HCC)   . Esophageal varices (HCC)   . Portal hypertensive gastropathy     Past Surgical History  Procedure Laterality Date  . No past surgeries    . Esophagogastroduodenoscopy (egd) with  propofol N/A 08/03/2015    Procedure: ESOPHAGOGASTRODUODENOSCOPY (EGD) WITH PROPOFOL;  Surgeon: Elnita Maxwell, MD;  Location: [facility] ENDOSCOPY;  Service: Endoscopy;  Laterality: N/A;  . Esophagogastroduodenoscopy (egd) with propofol N/A 08/31/2015    Procedure: ESOPHAGOGASTRODUODENOSCOPY (EGD) WITH PROPOFOL;  Surgeon: Elnita Maxwell, MD;  Location: [facility] ENDOSCOPY;  Service: Endoscopy;  Laterality: N/A;  . Esophagogastroduodenoscopy N/A 10/05/2015    Procedure: ESOPHAGOGASTRODUODENOSCOPY (EGD);  Surgeon: Elnita Maxwell, MD;  Location: [facility] ENDOSCOPY;  Service: Endoscopy;  Laterality: N/A;       History of present illness and  Hospital Course:     Kindly see H&P for history of present illness and admission details, please review complete Labs, Consult reports and Test reports for all details in brief  HPI  from the history and physical done on the day of admission  35 year old male patient with history of hypertension, schizophrenia, alcoholic liver cirrhosis with portal hypertension and esophageal varices comes in because of fever, abdominal pain nausea and vomiting and diarrhea. Admitted to hospitalist service for fever with possible SBP.    Hospital Course   #1 sepsis with possible SBP causing abdominal pain nausea and vomiting: Patient received IV Rocephin, vancomycin. CT of the abdomen did not show any appreciable fluid collection. Ultrasound of abdomen done did not show any fluid in the abdomen and no sizable pocket for paracentesis. So patient is seen by radiology, unable to get past diagnostic paracentesis also because of trace fluid in the lower abdomen. Anyway patient felt much better in terms of abdominal pain, nausea, vomiting. No fever. No leukocytosis. Patient seen by gastroenterology. Discharge home with Levaquin. #2 abdominal pain nausea vomiting diarrhea prophylaxis secondary to gastroenteritis: Patient was taking lactulose so I held the lactulose  here because of  diarrhea. Diarrhea improved. Patient also started on  IV hydration, Zofran, PPIs. diarrhea ,abdominal pain and nausea completely resolved. Started on diet and he is tolerating a regular diet. Patient advised to start the lactulose from tomorrow but to dictated dose.  #3 .diabetes mellitus type 2: Patient had acute renal failure so  held the metformin. Renal failure improved with hydration. Creatinine was 2.21 decrease to 1.10. Can resume metformin from tomorrow.  4. schizophrenia. Takes olanzapine.  #4 diabetes mellitus and possibly gastroparesis patient is on Reglan, metformin continue that. #6 history of COPD: No wheezing. Continue albuterol as needed. History of liver cirrhosis; patient Lasix was stopped  Here Because of dehydration with nausea vomiting. Patient can resume the Lasix. From tomorrow, #Hypertension: Patient's blood pressure was stopped while he is here so I have decreased the metoprolol to 25 mg twice a day. He was taking Toprol-XL 100 mg daily so I have decreased the dose. #7 discharged home with home physical therapy, Child psychotherapist, RN Discharge Condition: stable   Follow UP  Follow-up Information    Follow up with HOLLAND, CHELSA, NP. Go on 11/20/2015.   Specialty:  Nurse Practitioner   Why:  Tuesday at 11:15am for hospital follow-up.   Contact information:   [address] [place] [address] [phone]         Discharge Instructions  and  Discharge Medications     Discharge Instructions    Face-to-face encounter (required for Medicare/Medicaid patients)    Complete by:  As directed   I [facility] certify that this patient is under my care and that I, or a nurse practitioner or physician's assistant working with me, had a face-to-face encounter that meets the physician face-to-face encounter requirements with this patient on 11/13/2015. The encounter with the patient was in whole, or in part for the following medical condition(s) which is the primary  reason for home health carecirrhosis of liver SBP Copd DMII  The encounter with the patient was in whole, or in part, for the following medical condition, which is the primary reason for home health care:  whole  I certify that, based on my findings, the following services are medically necessary home health services:   Nursing Physical therapy    Reason for Medically Necessary Home Health Services:  Skilled Nursing- Skilled Assessment/Observation  My clinical findings support the need for the above services:  Unable to leave home safely without assistance and/or assistive device  Further, I certify that my clinical findings support that this patient is homebound due to:  Unsafe ambulation due to balance issues     Home Health    Complete by:  As directed   To provide the following care/treatments:   PT RN Home Health Aide Social work              Medication List    STOP taking these medications        metoprolol succinate 100 MG 24 hr tablet  Commonly known as:  TOPROL-XL      TAKE these medications        albuterol 108 (90 Base) MCG/ACT inhaler  Commonly known as:  PROVENTIL HFA;VENTOLIN HFA  Inhale 2 puffs into the lungs every 6 (six) hours as needed for wheezing or shortness of breath. Reported on 11/11/2015     aspirin EC 81 MG tablet  Take 81 mg by mouth daily.     citalopram 20 MG tablet  Commonly known as:  CELEXA  Take 1 tablet (20 mg total) by mouth daily.  diphenhydrAMINE 25 mg capsule  Commonly known as:  BENADRYL  TAKE 2 CAPSULES BY MOUTH AT BEDTIME     furosemide 20 MG tablet  Commonly known as:  LASIX  Take 20 mg by mouth 2 (two) times daily. Reported on 11/11/2015     lactulose 10 GM/15ML solution  Commonly known as:  CHRONULAC  Take 45 mLs (30 g total) by mouth 2 (two) times daily.     levofloxacin 500 MG tablet  Commonly known as:  LEVAQUIN  Take 1 tablet (500 mg total) by mouth daily.     losartan-hydrochlorothiazide 100-25 MG tablet   Commonly known as:  HYZAAR  Take 1 tablet by mouth daily.     metFORMIN 500 MG tablet  Commonly known as:  GLUCOPHAGE  Take 1 tablet (500 mg total) by mouth 2 (two) times daily with a meal.     metoCLOPramide 10 MG tablet  Commonly known as:  REGLAN  Take 1 tablet (10 mg total) by mouth 3 (three) times daily before meals.     metoprolol tartrate 25 MG tablet  Commonly known as:  LOPRESSOR  Take 1 tablet (25 mg total) by mouth 2 (two) times daily.     multivitamin with minerals Tabs tablet  Take 1 tablet by mouth daily.     OLANZapine 15 MG tablet  Commonly known as:  ZYPREXA  Take 1 tablet (15 mg total) by mouth at bedtime.     omeprazole 40 MG capsule  Commonly known as:  PRILOSEC  Take 40 mg by mouth every morning.     simvastatin 40 MG tablet  Commonly known as:  ZOCOR  Take 40 mg by mouth at bedtime.          Diet and Activity recommendation: See Discharge Instructions above   Consults obtained - GI   Major procedures and Radiology Reports - PLEASE review detailed and final reports for all details, in brief -      Dg Chest 2 View  11/11/2015  CLINICAL DATA:  Fever and tachypneic. Chest pain earlier today. No current pain. Nausea and vomiting today. Released from [facility] 2 days ago. Dizziness. EXAM: CHEST  2 VIEW COMPARISON:  11/04/2015 FINDINGS: Shallow inspiration with linear atelectasis in the lung bases. Normal heart size and pulmonary vascularity. No focal airspace disease or consolidation the lungs. No blunting of costophrenic angles. No pneumothorax. Mediastinal contours appear intact. Anterior wedging of a lower thoracic vertebra, probably T12. This was present previously. IMPRESSION: Shallow inspiration with atelectasis in the lung bases. Electronically Signed   By: Burman Nieves M.D.   On: 11/11/2015 01:14   Dg Chest 2 View  11/04/2015  CLINICAL DATA:  35 year old presenting with acute onset of right-sided chest pain at  approximately 2 o'clock p.m. this afternoon. Current history of hypertension, diabetes and alcoholic cirrhosis. EXAM: CHEST  2 VIEW COMPARISON:  10/28/2015 and earlier, including CTA chest 07/05/2015. FINDINGS: Cardiomediastinal silhouette unremarkable, unchanged. Mildly prominent bronchovascular markings diffusely and mild central peribronchial thickening, unchanged over multiple examinations. Linear scarring in the lingula, unchanged. Lungs otherwise clear. No localized airspace consolidation. No pleural effusions. No pneumothorax. Normal pulmonary vascularity. Mild degenerative changes involving the thoracic spine. IMPRESSION: Stable lingular scarring and mild changes of chronic bronchitis and/or asthma. No acute cardiopulmonary disease. Electronically Signed   By: Hulan Saas M.D.   On: 11/04/2015 21:46   Dg Chest 2 View  10/28/2015  CLINICAL DATA:  Chest pain that started today. Known cirrhosis. Uses O2  at home, but denies lung disease. EXAM: CHEST  2 VIEW COMPARISON:  10/16/2015 FINDINGS: The heart size and mediastinal contours are within normal limits. Both lungs are clear. The visualized skeletal structures are unremarkable. IMPRESSION: No active cardiopulmonary disease. Electronically Signed   By: Norva Pavlov M.D.   On: 10/28/2015 15:49   US Abdomen Limited  11/12/2015  CLINICAL DATA:  Possible ascites EXAM: LIMITED ABDOMEN ULTRASOUND FOR ASCITES TECHNIQUE: Limited ultrasound survey for ascites was performed in all four abdominal quadrants. COMPARISON:  11/05/2015 FINDINGS: Sonographic evaluation in all 4 quadrants reveals a trace amount of fluid within the abdomen although no sizable pocket to allow for safe paracentesis is noted. IMPRESSION: Trace free fluid.  No safe pocket for paracentesis is noted. Electronically Signed   By: Alcide Clever M.D.   On: 11/12/2015 09:31   Dg Chest Portable 1 View  10/16/2015  CLINICAL DATA:  35 year old male with altered mental status, dizziness, liver  disease and diabetes. EXAM: PORTABLE CHEST 1 VIEW COMPARISON:  07/30/2015 and prior exams FINDINGS: The cardiomediastinal silhouette is unremarkable. There is no evidence of focal airspace disease, pulmonary edema, suspicious pulmonary nodule/mass, pleural effusion, or pneumothorax. No acute bony abnormalities are identified. IMPRESSION: No active disease. Electronically Signed   By: Harmon Pier M.D.   On: 10/16/2015 20:48   Ct Renal Stone Study  11/05/2015  CLINICAL DATA:  35 year old male chest with fracture pain and decreased urine output. EXAM: CT ABDOMEN AND PELVIS WITHOUT CONTRAST TECHNIQUE: Multidetector CT imaging of the abdomen and pelvis was performed following the standard protocol without IV contrast. COMPARISON:  Multiple abdominal ultrasound dating back to 07/05/2015 FINDINGS: Evaluation of this exam is limited in the absence of intravenous contrast. The visualized lung bases are clear. No intra-abdominal free air or free fluid. Morphologic changes of cirrhosis. No calcified gallstone. The pancreas appears unremarkable. Splenomegaly measuring 18 cm. The adrenal glands, kidneys, visualized ureters, and urinary bladder appear unremarkable. The prostate and seminal vesicles are grossly unremarkable. There is no evidence of bowel obstruction or inflammation. Normal appendix. There is mild diffuse haziness and stranding of the mesentery with prominence of the vasculature compatible with varices. There is apparent recanalization of the umbilical vein. The abdominal aorta and IVC appear grossly unremarkable on this noncontrast study. No portal venous gas identified. There is no adenopathy. The abdominal wall soft tissues appear unremarkable. There is old-appearing compression deformity of the superior endplate of the T11 vertebra. No acute fracture. IMPRESSION: No hydronephrosis or nephrolithiasis. Cirrhosis with evidence of portal hypertension, abdominal varices and splenomegaly. No ascites.  Electronically Signed   By: Elgie Collard M.D.   On: 11/05/2015 03:44    Micro Results     Recent Results (from the past 240 hour(s))  Urine culture     Status: None   Collection Time: 11/10/15  7:41 PM  Result Value Ref Range Status   Specimen Description URINE, CLEAN CATCH  Final   Special Requests Normal  Final   Culture NO GROWTH 1 DAY  Final   Report Status 11/13/2015 FINAL  Final  Blood culture (routine x 2)     Status: None (Preliminary result)   Collection Time: 11/11/15 12:41 AM  Result Value Ref Range Status   Specimen Description BLOOD LEFT HAND  Final   Special Requests BOTTLES DRAWN AEROBIC AND ANAEROBIC 3CCAERO,2CCANA  Final   Culture NO GROWTH 2 DAYS  Final   Report Status PENDING  Incomplete  Blood culture (routine x 2)  Status: None (Preliminary result)   Collection Time: 11/11/15 12:41 AM  Result Value Ref Range Status   Specimen Description BLOOD RIGHT ASSIST CONTROL  Final   Special Requests BOTTLES DRAWN AEROBIC AND ANAEROBIC 1CCAERO,1CCANA  Final   Culture NO GROWTH 2 DAYS  Final   Report Status PENDING  Incomplete       Today   Subjective:   Canyon Lohr today has no headache,no chest abdominal pain,no new weakness tingling or numbness, feels much better wants to go home today.   Objective:   Blood pressure 134/70, pulse 76, temperature 98.6 F (37 C), temperature source Oral, resp. rate 20, height  (1.905 m), weight 133.358 kg (294 lb), SpO2 98 %.   Intake/Output Summary (Last 24 hours) at 11/13/15 1405 Last data filed at 11/13/15 1004  Gross per 24 hour  Intake   1248 ml  Output   1976 ml  Net   -728 ml    Exam Awake Alert, Oriented x 3, No new F.N deficits, Normal affect [place].AT,PERRAL Supple Neck,No JVD, No cervical lymphadenopathy appriciated.  Symmetrical Chest wall movement, Good air movement bilaterally, CTAB RRR,No Gallops,Rubs or new Murmurs, No Parasternal Heave +ve B.Sounds, Abd Soft, Non tender, No organomegaly  appriciated, No rebound -guarding or rigidity. No Cyanosis, Clubbing or edema, No new Rash or bruise  Data Review   CBC w Diff: Lab Results  Component Value Date   WBC 6.6 11/11/2015   WBC 10.1 12/25/2014   HGB 9.8* 11/11/2015   HGB 14.4 12/25/2014   HCT 29.1* 11/11/2015   HCT 44.7 12/25/2014   PLT 104* 11/11/2015   PLT 155 12/25/2014   LYMPHOPCT 13 07/04/2015   MONOPCT 9 07/04/2015   EOSPCT 1 07/04/2015   BASOPCT 1 07/04/2015    CMP: Lab Results  Component Value Date   NA 139 11/13/2015   NA 141 12/25/2014   K 4.1 11/13/2015   K 3.4* 12/25/2014   CL 109 11/13/2015   CL 100* 12/25/2014   CO2 23 11/13/2015   CO2 30 12/25/2014   BUN 20 11/13/2015   BUN 15 12/25/2014   CREATININE 1.10 11/13/2015   CREATININE 1.23 12/25/2014   PROT 7.0 11/11/2015   PROT 8.5* 12/25/2014   ALBUMIN 3.5 11/11/2015   ALBUMIN 4.1 12/25/2014   BILITOT 1.5* 11/11/2015   BILITOT 0.9 12/25/2014   ALKPHOS 69 11/11/2015   ALKPHOS 148* 12/25/2014   AST 45* 11/11/2015   AST 146* 12/25/2014   ALT 43 11/11/2015   ALT 107* 12/25/2014  .   Total Time in preparing paper work, data evaluation and todays exam - 35 minutes  Lakara Weiland M.D on 11/13/2015 at 2:05 PM    Note: This dictation was prepared with Dragon dictation along with smaller phrase technology. Any transcriptional errors that result from this process are unintentional.

## 2015-11-13 NOTE — Care Management Important Message (Signed)
Important Message  Patient Details  Name: Howard [name] MRN: [id number] Date of Birth: 11-27-1980   Medicare Important Message Given:  Yes    Olegario Messier A Vivion Romano 11/13/2015, 10:13 AM

## 2015-11-13 NOTE — Progress Notes (Signed)
Key Points: Use following P&T approved IV to PO antibiotic change policy.  Description contains the criteria that are approved Note: Policy Excludes:  Esophagectomy patientsPHARMACIST - PHYSICIAN COMMUNICATION DR:   Luberta Mutter CONCERNING: IV to Oral Route Change Policy  RECOMMENDATION: This patient is receiving Protonix by the intravenous route.  Based on criteria approved by the Pharmacy and Therapeutics Committee, the intravenous medication(s) is/are being converted to the equivalent oral dose form(s).   DESCRIPTION: These criteria include:  The patient is eating (either orally or via tube) and/or has been taking other orally administered medications for a least 24 hours  The patient has no evidence of active gastrointestinal bleeding or impaired GI absorption (gastrectomy, short bowel, patient on TNA or NPO).  If you have questions about this conversion, please contact the Pharmacy Department    [phone] )  [facility]   [phone] )  [facility]   [phone] )  [facility]   [phone] )  [facility]   [phone] )  [facility]   Clovia Cuff, PharmD, BCPS 11/13/2015 10:00 AM

## 2015-11-13 NOTE — Care Management Note (Signed)
Case Management Note  Patient Details  Name: Howard [name] MRN: [id number] Date of Birth: 03/04/1981  Subjective/Objective:                 Patient admitted from home with decompensated liver disease.  Patient lives at home with his mother and brother.  Mother provides transportation for patient.  Patient has chronic O2 through [facility].  Mother to bring portable tank for time of discharge. Patient was arrange with PT and RN with [facility] at previous discharge on 11/10/15.  I discussed the case Dr Judithann Sheen who agrees that the patient is appropriate for [facility] services.  Will need resumption of home health care orders at time of discharge and patient could benefit from SW services also added.     Action/Plan:   Expected Discharge Date:                  Expected Discharge Plan:     In-House Referral:     Discharge planning Services     Post Acute Care Choice:    Choice offered to:     DME Arranged:    DME Agency:     HH Arranged:    HH Agency:     Status of Service:     Medicare Important Message Given:  Yes Date Medicare IM Given:    Medicare IM give by:    Date Additional Medicare IM Given:    Additional Medicare Important Message give by:     If discussed at Long Length of Stay Meetings, dates discussed:    Additional Comments:  Chapman Fitch, RN 11/13/2015, 10:39 AM

## 2015-11-13 NOTE — Care Management (Signed)
Please disregard note dates at 11/09/15 1:55pm.  Entered on wrong account and did not have the option to delete or edit

## 2015-11-16 LAB — CULTURE, BLOOD (ROUTINE X 2)
Culture: NO GROWTH
Culture: NO GROWTH

## 2015-11-18 ENCOUNTER — Emergency Department
Admission: EM | Admit: 2015-11-18 | Discharge: 2015-11-18 | Disposition: A | Discharge status: Home / Self Care | Payer: Medicare Other | Attending: Emergency Medicine | Admitting: Emergency Medicine

## 2015-11-18 ENCOUNTER — Encounter: Payer: Self-pay | Admitting: *Deleted

## 2015-11-18 ENCOUNTER — Emergency Department: Payer: Medicare Other

## 2015-11-18 DIAGNOSIS — I10 Essential (primary) hypertension: Secondary | ICD-10-CM | POA: Insufficient documentation

## 2015-11-18 DIAGNOSIS — E119 Type 2 diabetes mellitus without complications: Secondary | ICD-10-CM | POA: Diagnosis not present

## 2015-11-18 DIAGNOSIS — R109 Unspecified abdominal pain: Secondary | ICD-10-CM | POA: Insufficient documentation

## 2015-11-18 LAB — COMPREHENSIVE METABOLIC PANEL
ALT: 142 U/L — AB (ref 17–63)
AST: 97 U/L — ABNORMAL HIGH (ref 15–41)
Albumin: 3.5 g/dL (ref 3.5–5.0)
Alkaline Phosphatase: 166 U/L — ABNORMAL HIGH (ref 38–126)
Anion gap: 7 (ref 5–15)
BILIRUBIN TOTAL: 0.8 mg/dL (ref 0.3–1.2)
BUN: 17 mg/dL (ref 6–20)
CHLORIDE: 108 mmol/L (ref 101–111)
CO2: 25 mmol/L (ref 22–32)
CREATININE: 0.95 mg/dL (ref 0.61–1.24)
Calcium: 8.9 mg/dL (ref 8.9–10.3)
Glucose, Bld: 124 mg/dL — ABNORMAL HIGH (ref 65–99)
Potassium: 4.1 mmol/L (ref 3.5–5.1)
Sodium: 140 mmol/L (ref 135–145)
TOTAL PROTEIN: 7.3 g/dL (ref 6.5–8.1)

## 2015-11-18 LAB — URINALYSIS COMPLETE WITH MICROSCOPIC (ARMC ONLY)
BILIRUBIN URINE: NEGATIVE
Bacteria, UA: NONE SEEN
GLUCOSE, UA: NEGATIVE mg/dL
KETONES UR: NEGATIVE mg/dL
Leukocytes, UA: NEGATIVE
NITRITE: NEGATIVE
PROTEIN: NEGATIVE mg/dL
SPECIFIC GRAVITY, URINE: 1.017 (ref 1.005–1.030)
pH: 5 (ref 5.0–8.0)

## 2015-11-18 LAB — CBC
HCT: 29.6 % — ABNORMAL LOW (ref 40.0–52.0)
Hemoglobin: 9.8 g/dL — ABNORMAL LOW (ref 13.0–18.0)
MCH: 31.3 pg (ref 26.0–34.0)
MCHC: 33.2 g/dL (ref 32.0–36.0)
MCV: 94.5 fL (ref 80.0–100.0)
PLATELETS: 159 10*3/uL (ref 150–440)
RBC: 3.13 MIL/uL — AB (ref 4.40–5.90)
RDW: 15 % — AB (ref 11.5–14.5)
WBC: 6.6 10*3/uL (ref 3.8–10.6)

## 2015-11-18 MED ORDER — OXYCODONE HCL 5 MG PO TABS
10.0000 mg | ORAL_TABLET | Freq: Once | ORAL | Status: AC
  Administered 2015-11-18: 10 mg via ORAL
  Filled 2015-11-18: qty 2

## 2015-11-18 MED ORDER — TRAMADOL HCL 50 MG PO TABS: 50.0000 mg | ORAL_TABLET | Freq: Four times a day (QID) | ORAL | Status: DC | PRN

## 2015-11-18 NOTE — ED Notes (Signed)
Pt reports left flank pain since yesterday.  Pt also reports nausea.  Pt is on 2 liters oxygen at home.

## 2015-11-18 NOTE — ED Provider Notes (Signed)
[facility] Emergency Department Provider Note     Time seen: ----------------------------------------- 6:42 PM on 11/18/2015 -----------------------------------------    I have reviewed the triage vital signs and the nursing notes.   HISTORY  Chief Complaint Flank Pain    HPI Howard [name] is a 35 y.o. male who presents to ER for sharp, left flank pain that started yesterday. Patient reports nausea, nothing makes his symptoms better or worse. Patient denies any nausea, vomiting or diarrhea.    Past Medical History  Diagnosis Date  . Hypertension   . Schizophrenia (HCC)   . Hyperlipemia   . Depression   . Diabetes (HCC)   . Liver disease   . Diabetes mellitus, type II (HCC)   . Heart disease   . Alcoholic cirrhosis of liver with ascites (HCC)   . Esophageal varices (HCC)   . Portal hypertensive gastropathy     Patient Active Problem List   Diagnosis Date Noted  . Fever 11/11/2015  . Ascites due to alcoholic cirrhosis (HCC) 11/11/2015  . Cirrhosis of liver with ascites (HCC) 11/11/2015  . Hepatic encephalopathy (HCC) 10/16/2015  . Type 2 diabetes mellitus (HCC) 10/16/2015  . Acute renal failure (ARF) (HCC) 07/30/2015  . 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 Hypoxia 07/04/2015  . Elevated transaminase level 07/04/2015  . DOE (dyspnea on exertion) 07/04/2015  . Alcohol abuse 05/31/2015  . Hypertension 05/29/2015  . Paranoid schizophrenia (HCC) 03/20/2015    Past Surgical History  Procedure Laterality Date  . No past surgeries    . Esophagogastroduodenoscopy (egd) with propofol N/A 08/03/2015    Procedure: ESOPHAGOGASTRODUODENOSCOPY (EGD) WITH PROPOFOL;  Surgeon: Elnita Maxwell, MD;  Location: [facility] ENDOSCOPY;  Service: Endoscopy;  Laterality: N/A;  . Esophagogastroduodenoscopy (egd) with propofol N/A 08/31/2015    Procedure: ESOPHAGOGASTRODUODENOSCOPY (EGD) WITH PROPOFOL;  Surgeon: Elnita Maxwell, MD;   Location: [facility] ENDOSCOPY;  Service: Endoscopy;  Laterality: N/A;  . Esophagogastroduodenoscopy N/A 10/05/2015    Procedure: ESOPHAGOGASTRODUODENOSCOPY (EGD);  Surgeon: Elnita Maxwell, MD;  Location: [facility] ENDOSCOPY;  Service: Endoscopy;  Laterality: N/A;    Allergies Review of patient's allergies indicates no known allergies.  Social History Social History  Substance Use Topics  . Smoking status: Never Smoker   . Smokeless tobacco: Never Used  . Alcohol Use: No     Comment: last drank 1 month ago    Review of Systems Constitutional: Negative for fever. Eyes: Negative for visual changes. ENT: Negative for sore throat. Cardiovascular: Negative for chest pain. Respiratory: Negative for shortness of breath. Gastrointestinal: Positive for left flank pain Genitourinary: Negative for dysuria. Musculoskeletal: Negative for back pain. Skin: Negative for rash. Neurological: Negative for headaches, focal weakness or numbness.  10-point ROS otherwise negative.  ____________________________________________   PHYSICAL EXAM:  VITAL SIGNS: ED Triage Vitals  Enc Vitals Group     BP 11/18/15 1602 124/58 mmHg     Pulse Rate 11/18/15 1602 68     Resp 11/18/15 1602 18     Temp 11/18/15 1602 98.7 F (37.1 C)     Temp Source 11/18/15 1602 Oral     SpO2 11/18/15 1602 100 %     Weight 11/18/15 1602 290 lb (131.543 kg)     Height 11/18/15 1602  (1.905 m)     Head Cir --      Peak Flow --      Pain Score 11/18/15 1602 6     Pain Loc --  Pain Edu? --      Excl. in GC? --     Constitutional: Alert and oriented. Well appearing and in no distress. Eyes: Conjunctivae are normal. PERRL. Normal extraocular movements. ENT   Head: Normocephalic and atraumatic.   Nose: No congestion/rhinnorhea.   Mouth/Throat: Mucous membranes are moist.   Neck: No stridor. Cardiovascular: Normal rate, regular rhythm. Normal and symmetric distal pulses are present in all extremities.  No murmurs, rubs, or gallops. Respiratory: Normal respiratory effort without tachypnea nor retractions. Breath sounds are clear and equal bilaterally. No wheezes/rales/rhonchi. Gastrointestinal: Left CVA tenderness, normal bowel sounds. Musculoskeletal: Nontender with normal range of motion in all extremities. No joint effusions.  No lower extremity tenderness nor edema. Neurologic:  Normal speech and language. No gross focal neurologic deficits are appreciated. Speech is normal. No gait instability. Skin:  Skin is warm, dry and intact. No rash noted. Psychiatric: Mood and affect are normal. Speech and behavior are normal. Patient exhibits appropriate insight and judgment. ____________________________________________  ED COURSE:  Pertinent labs & imaging results that were available during my care of the patient were reviewed by me and considered in my medical decision making (see chart for details). Patient is in no acute distress, will obtain basic labs and possible CT imaging. ____________________________________________    LABS (pertinent positives/negatives)  Labs Reviewed  CBC - Abnormal; Notable for the following:    RBC 3.13 (*)    Hemoglobin 9.8 (*)    HCT 29.6 (*)    RDW 15.0 (*)    All other components within normal limits  COMPREHENSIVE METABOLIC PANEL - Abnormal; Notable for the following:    Glucose, Bld 124 (*)    AST 97 (*)    ALT 142 (*)    Alkaline Phosphatase 166 (*)    All other components within normal limits  URINALYSIS COMPLETEWITH MICROSCOPIC ([facility] ONLY) - Abnormal; Notable for the following:    Color, Urine YELLOW (*)    APPearance CLEAR (*)    Hgb urine dipstick 3+ (*)    Squamous Epithelial / LPF 0-5 (*)    All other components within normal limits    RADIOLOGY   IMPRESSION: Re- demonstrated haziness of the central mesentery and involving the retroperitoneum, nonspecific.  Morphologic changes to the liver compatible with cirrhosis. Associated  splenomegaly compatible with portal venous hypertension.  No hydronephrosis. No nephrolithiasis.  Indeterminate left adrenal nodule. Recommend follow-up CT or MRI in 12 months for definitive characterization.  ____________________________________________  FINAL ASSESSMENT AND PLAN  Flank pain  Plan: Patient with labs and imaging as dictated above. Patient is in no acute distress, will be prescribed pain medicine and will be encouraged to have close follow-up with his doctor for reevaluation. Labs are consistent with known prior disease.   Emily Filbert, MD   Emily Filbert, MD 11/18/15 [phone]

## 2015-11-18 NOTE — Discharge Instructions (Signed)
Flank Pain °Flank pain refers to pain that is located on the side of the body between the upper abdomen and the back. The pain may occur over a short period of time (acute) or may be long-term or reoccurring (chronic). It may be mild or severe. Flank pain can be caused by many things. °CAUSES  °Some of the more common causes of flank pain include: °· Muscle strains.   °· Muscle spasms.   °· A disease of your spine (vertebral disk disease).   °· A lung infection (pneumonia).   °· Fluid around your lungs (pulmonary edema).   °· A kidney infection.   °· Kidney stones.   °· A very painful skin rash caused by the chickenpox virus (shingles).   °· Gallbladder disease.   °HOME CARE INSTRUCTIONS  °Home care will depend on the cause of your pain. In general, °· Rest as directed by your caregiver. °· Drink enough fluids to keep your urine clear or pale yellow. °· Only take over-the-counter or prescription medicines as directed by your caregiver. Some medicines may help relieve the pain. °· Tell your caregiver about any changes in your pain. °· Follow up with your caregiver as directed. °SEEK IMMEDIATE MEDICAL CARE IF:  °· Your pain is not controlled with medicine.   °· You have new or worsening symptoms. °· Your pain increases.   °· You have abdominal pain.   °· You have shortness of breath.   °· You have persistent nausea or vomiting.   °· You have swelling in your abdomen.   °· You feel faint or pass out.   °· You have blood in your urine. °· You have a fever or persistent symptoms for more than 2-3 days. °· You have a fever and your symptoms suddenly get worse. °MAKE SURE YOU:  °· Understand these instructions. °· Will watch your condition. °· Will get help right away if you are not doing well or get worse. °  °This information is not intended to replace advice given to you by your health care provider. Make sure you discuss any questions you have with your health care provider. °  °Document Released: 10/30/2005 Document  Revised: 06/02/2012 Document Reviewed: 04/22/2012 °Elsevier Interactive Patient Education ©2016 [employer] ° °

## 2015-11-20 ENCOUNTER — Emergency Department
Admission: EM | Admit: 2015-11-20 | Discharge: 2015-11-20 | Disposition: A | Discharge status: Home / Self Care | Payer: Medicare Other | Attending: Emergency Medicine | Admitting: Emergency Medicine

## 2015-11-20 ENCOUNTER — Encounter: Payer: Self-pay | Admitting: Emergency Medicine

## 2015-11-20 DIAGNOSIS — E119 Type 2 diabetes mellitus without complications: Secondary | ICD-10-CM | POA: Diagnosis not present

## 2015-11-20 DIAGNOSIS — Z7982 Long term (current) use of aspirin: Secondary | ICD-10-CM | POA: Insufficient documentation

## 2015-11-20 DIAGNOSIS — Z79899 Other long term (current) drug therapy: Secondary | ICD-10-CM | POA: Diagnosis not present

## 2015-11-20 DIAGNOSIS — K2970 Gastritis, unspecified, without bleeding: Secondary | ICD-10-CM | POA: Diagnosis not present

## 2015-11-20 DIAGNOSIS — R1010 Upper abdominal pain, unspecified: Secondary | ICD-10-CM | POA: Diagnosis present

## 2015-11-20 DIAGNOSIS — I10 Essential (primary) hypertension: Secondary | ICD-10-CM | POA: Diagnosis not present

## 2015-11-20 DIAGNOSIS — F419 Anxiety disorder, unspecified: Secondary | ICD-10-CM | POA: Diagnosis not present

## 2015-11-20 DIAGNOSIS — Z792 Long term (current) use of antibiotics: Secondary | ICD-10-CM | POA: Insufficient documentation

## 2015-11-20 LAB — COMPREHENSIVE METABOLIC PANEL
ALBUMIN: 3.3 g/dL — AB (ref 3.5–5.0)
ALK PHOS: 132 U/L — AB (ref 38–126)
ALT: 95 U/L — AB (ref 17–63)
AST: 60 U/L — AB (ref 15–41)
Anion gap: 8 (ref 5–15)
BILIRUBIN TOTAL: 0.8 mg/dL (ref 0.3–1.2)
BUN: 21 mg/dL — AB (ref 6–20)
CALCIUM: 9.2 mg/dL (ref 8.9–10.3)
CO2: 25 mmol/L (ref 22–32)
Chloride: 108 mmol/L (ref 101–111)
Creatinine, Ser: 1.71 mg/dL — ABNORMAL HIGH (ref 0.61–1.24)
GFR calc Af Amer: 58 mL/min — ABNORMAL LOW (ref >60)
GFR calc non Af Amer: 50 mL/min — ABNORMAL LOW (ref >60)
GLUCOSE: 90 mg/dL (ref 65–99)
Potassium: 4.7 mmol/L (ref 3.5–5.1)
SODIUM: 141 mmol/L (ref 135–145)
TOTAL PROTEIN: 7.1 g/dL (ref 6.5–8.1)

## 2015-11-20 LAB — CBC
HCT: 28.5 % — ABNORMAL LOW (ref 40.0–52.0)
Hemoglobin: 9.5 g/dL — ABNORMAL LOW (ref 13.0–18.0)
MCH: 31 pg (ref 26.0–34.0)
MCHC: 33.5 g/dL (ref 32.0–36.0)
MCV: 92.7 fL (ref 80.0–100.0)
Platelets: 155 10*3/uL (ref 150–440)
RBC: 3.07 MIL/uL — ABNORMAL LOW (ref 4.40–5.90)
RDW: 15 % — AB (ref 11.5–14.5)
WBC: 6.4 10*3/uL (ref 3.8–10.6)

## 2015-11-20 LAB — LIPASE, BLOOD: Lipase: 35 U/L (ref 11–51)

## 2015-11-20 LAB — GLUCOSE, CAPILLARY: Glucose-Capillary: 83 mg/dL (ref 65–99)

## 2015-11-20 MED ORDER — SODIUM CHLORIDE 0.9 % IV BOLUS (SEPSIS)
500.0000 mL | Freq: Once | INTRAVENOUS | Status: AC
  Administered 2015-11-20: 500 mL via INTRAVENOUS

## 2015-11-20 MED ORDER — ONDANSETRON HCL 4 MG/2ML IJ SOLN
4.0000 mg | Freq: Once | INTRAMUSCULAR | Status: AC
  Administered 2015-11-20: 4 mg via INTRAVENOUS
  Filled 2015-11-20: qty 2

## 2015-11-20 MED ORDER — ONDANSETRON HCL 4 MG PO TABS: 4.0000 mg | ORAL_TABLET | Freq: Every day | ORAL | Status: DC | PRN

## 2015-11-20 NOTE — Discharge Instructions (Signed)

## 2015-11-20 NOTE — ED Notes (Signed)
C/O vomiting and nasea x 2 days.  Also c/o abdominal discomfort

## 2015-11-20 NOTE — ED Provider Notes (Signed)
[facility] Emergency Department Provider Note  ____________________________________________    I have reviewed the triage vital signs and the nursing notes.   HISTORY  Chief Complaint Emesis and Abdominal Pain    HPI Howard [name] is a 35 y.o. male presents with complaints of nausea and vomiting which apparently started yesterday but has continued today. He complains of mild abdominal cramping in the upper abdomen. No fevers or chills. No recent sick contacts. Normal stools. No recent travel. He has not taken anything for this     Past Medical History  Diagnosis Date  . Hypertension   . Schizophrenia (HCC)   . Hyperlipemia   . Depression   . Diabetes (HCC)   . Liver disease   . Diabetes mellitus, type II (HCC)   . Heart disease   . Alcoholic cirrhosis of liver with ascites (HCC)   . Esophageal varices (HCC)   . Portal hypertensive gastropathy     Patient Active Problem List   Diagnosis Date Noted  . Fever 11/11/2015  . Ascites due to alcoholic cirrhosis (HCC) 11/11/2015  . Cirrhosis of liver with ascites (HCC) 11/11/2015  . Hepatic encephalopathy (HCC) 10/16/2015  . Type 2 diabetes mellitus (HCC) 10/16/2015  . Acute renal failure (ARF) (HCC) 07/30/2015  . 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 Hypoxia 07/04/2015  . Elevated transaminase level 07/04/2015  . DOE (dyspnea on exertion) 07/04/2015  . Alcohol abuse 05/31/2015  . Hypertension 05/29/2015  . Paranoid schizophrenia (HCC) 03/20/2015    Past Surgical History  Procedure Laterality Date  . No past surgeries    . Esophagogastroduodenoscopy (egd) with propofol N/A 08/03/2015    Procedure: ESOPHAGOGASTRODUODENOSCOPY (EGD) WITH PROPOFOL;  Surgeon: Elnita Maxwell, MD;  Location: [facility] ENDOSCOPY;  Service: Endoscopy;  Laterality: N/A;  . Esophagogastroduodenoscopy (egd) with propofol N/A 08/31/2015    Procedure: ESOPHAGOGASTRODUODENOSCOPY (EGD) WITH PROPOFOL;   Surgeon: Elnita Maxwell, MD;  Location: [facility] ENDOSCOPY;  Service: Endoscopy;  Laterality: N/A;  . Esophagogastroduodenoscopy N/A 10/05/2015    Procedure: ESOPHAGOGASTRODUODENOSCOPY (EGD);  Surgeon: Elnita Maxwell, MD;  Location: [facility] ENDOSCOPY;  Service: Endoscopy;  Laterality: N/A;    Current Outpatient Rx  Name  Route  Sig  Dispense  Refill  . albuterol (PROVENTIL HFA;VENTOLIN HFA) 108 (90 BASE) MCG/ACT inhaler   Inhalation   Inhale 2 puffs into the lungs every 6 (six) hours as needed for wheezing or shortness of breath. Reported on 11/11/2015         . aspirin EC 81 MG tablet   Oral   Take 81 mg by mouth daily.          . citalopram (CELEXA) 20 MG tablet   Oral   Take 1 tablet (20 mg total) by mouth daily.   90 tablet   1   . diphenhydrAMINE (BENADRYL) 25 mg capsule      TAKE 2 CAPSULES BY MOUTH AT BEDTIME   60 capsule   2   . furosemide (LASIX) 20 MG tablet   Oral   Take 20 mg by mouth 2 (two) times daily. Reported on 11/11/2015         . lactulose (CHRONULAC) 10 GM/15ML solution   Oral   Take 45 mLs (30 g total) by mouth 2 (two) times daily.   240 mL   0   . levofloxacin (LEVAQUIN) 500 MG tablet   Oral   Take 1 tablet (500 mg total) by mouth daily.   14 tablet  0   . losartan-hydrochlorothiazide (HYZAAR) 100-25 MG tablet   Oral   Take 1 tablet by mouth daily.          . metFORMIN (GLUCOPHAGE) 500 MG tablet   Oral   Take 1 tablet (500 mg total) by mouth 2 (two) times daily with a meal.   30 tablet   0   . metoCLOPramide (REGLAN) 10 MG tablet   Oral   Take 1 tablet (10 mg total) by mouth 3 (three) times daily before meals.   20 tablet   0   . metoprolol tartrate (LOPRESSOR) 25 MG tablet   Oral   Take 1 tablet (25 mg total) by mouth 2 (two) times daily.   60 tablet   0   . Multiple Vitamin (MULTIVITAMIN WITH MINERALS) TABS tablet   Oral   Take 1 tablet by mouth daily.   30 tablet   0   . OLANZapine (ZYPREXA) 15 MG tablet    Oral   Take 1 tablet (15 mg total) by mouth at bedtime.   90 tablet   1   . omeprazole (PRILOSEC) 40 MG capsule   Oral   Take 40 mg by mouth every morning.          . ondansetron (ZOFRAN) 4 MG tablet   Oral   Take 1 tablet (4 mg total) by mouth daily as needed for nausea or vomiting.   20 tablet   1   . simvastatin (ZOCOR) 40 MG tablet   Oral   Take 40 mg by mouth at bedtime.          . traMADol (ULTRAM) 50 MG tablet   Oral   Take 1 tablet (50 mg total) by mouth every 6 (six) hours as needed.   20 tablet   0     Allergies Review of patient's allergies indicates no known allergies.  Family History  Problem Relation Age of Onset  . Heart disease Mother   . Hypertension Mother   . Hyperlipidemia Mother   . Stroke Father   . Heart attack Father   . Hypertension Father   . Heart disease Father   . Alcohol abuse Father   . Heart disease Brother     Social History Social History  Substance Use Topics  . Smoking status: Never Smoker   . Smokeless tobacco: Never Used  . Alcohol Use: No     Comment: last drink 35 days ago    Review of Systems  Constitutional: Negative for fever.  ENT: Negative for sore throat Cardiovascular: Negative for chest pain. Respiratory: Negative for shortness of breath. Gastrointestinal: As above Genitourinary: Negative for dysuria. Musculoskeletal: Negative for back pain. Skin: Negative for rash. Neurological: Negative for headaches  Psychiatric: Anxiety    ____________________________________________   PHYSICAL EXAM:  VITAL SIGNS: ED Triage Vitals  Enc Vitals Group     BP 11/20/15 1242 107/58 mmHg     Pulse Rate 11/20/15 1242 72     Resp 11/20/15 1242 16     Temp 11/20/15 1242 98.1 F (36.7 C)     Temp Source 11/20/15 1242 Oral     SpO2 11/20/15 1242 97 %     Weight 11/20/15 1242 290 lb (131.543 kg)     Height 11/20/15 1242 6\' 3"  (1.905 m)     Head Cir --      Peak Flow --      Pain Score 11/20/15 1243 6      Pain Loc --  Pain Edu? --      Excl. in GC? --      Constitutional: Alert and oriented. No acute distress. Eyes: Conjunctivae are normal.  ENT   Head: Normocephalic and atraumatic.   Mouth/Throat: Mucous membranes are moist. Cardiovascular: Normal rate, regular rhythm. Normal and symmetric distal pulses are present in all extremities. No murmurs, rubs, or gallops. Respiratory: Normal respiratory effort without tachypnea nor retractions. Breath sounds are clear and equal bilaterally.  Gastrointestinal: Soft and non-tender in all quadrants. No distention. There is no CVA tenderness. Genitourinary: deferred Musculoskeletal: Nontender with normal range of motion in all extremities. No lower extremity tenderness nor edema. Neurologic:  Normal speech and language. No gross focal neurologic deficits are appreciated. Skin:  Skin is warm, dry and intact. No rash noted. Psychiatric: Mood and affect are normal. Patient exhibits appropriate insight and judgment.  ____________________________________________    LABS (pertinent positives/negatives)  Labs Reviewed  COMPREHENSIVE METABOLIC PANEL - Abnormal; Notable for the following:    BUN 21 (*)    Creatinine, Ser 1.71 (*)    Albumin 3.3 (*)    AST 60 (*)    ALT 95 (*)    Alkaline Phosphatase 132 (*)    GFR calc non Af Amer 50 (*)    GFR calc Af Amer 58 (*)    All other components within normal limits  CBC - Abnormal; Notable for the following:    RBC 3.07 (*)    Hemoglobin 9.5 (*)    HCT 28.5 (*)    RDW 15.0 (*)    All other components within normal limits  LIPASE, BLOOD  GLUCOSE, CAPILLARY    ____________________________________________   EKG  None  ____________________________________________    RADIOLOGY I have personally reviewed any xrays that were ordered on this patient: None  ____________________________________________   PROCEDURES  Procedure(s) performed: none  Critical Care performed:  none  ____________________________________________   INITIAL IMPRESSION / ASSESSMENT AND PLAN / ED COURSE  Pertinent labs & imaging results that were available during my care of the patient were reviewed by me and considered in my medical decision making (see chart for details).  Patient overall well-appearing with a benign exam. We will start an IV and give 500 cc bolus and IV Zofran for his symptoms. Lab work shows mild dehydration but his creatinine is in line with prior results. Normal CBC.  Patient was feeling significant better after IV fluids and Zofran. He is no longer vomiting and is tolerating by mouth's, at this point I feel he is appropriate for discharge  ____________________________________________   FINAL CLINICAL IMPRESSION(S) / ED DIAGNOSES  Final diagnoses:  Gastritis     Jene Every, MD 11/20/15 [phone]

## 2015-11-21 ENCOUNTER — Emergency Department
Admission: EM | Admit: 2015-11-21 | Discharge: 2015-11-22 | Disposition: A | Discharge status: Home / Self Care | Payer: Medicare Other | Attending: Emergency Medicine | Admitting: Emergency Medicine

## 2015-11-21 ENCOUNTER — Encounter: Payer: Self-pay | Admitting: Emergency Medicine

## 2015-11-21 DIAGNOSIS — Z792 Long term (current) use of antibiotics: Secondary | ICD-10-CM | POA: Diagnosis not present

## 2015-11-21 DIAGNOSIS — J441 Chronic obstructive pulmonary disease with (acute) exacerbation: Secondary | ICD-10-CM | POA: Insufficient documentation

## 2015-11-21 DIAGNOSIS — E119 Type 2 diabetes mellitus without complications: Secondary | ICD-10-CM | POA: Diagnosis not present

## 2015-11-21 DIAGNOSIS — I10 Essential (primary) hypertension: Secondary | ICD-10-CM | POA: Insufficient documentation

## 2015-11-21 DIAGNOSIS — R531 Weakness: Secondary | ICD-10-CM | POA: Insufficient documentation

## 2015-11-21 DIAGNOSIS — Z79899 Other long term (current) drug therapy: Secondary | ICD-10-CM | POA: Diagnosis not present

## 2015-11-21 DIAGNOSIS — Z7984 Long term (current) use of oral hypoglycemic drugs: Secondary | ICD-10-CM | POA: Insufficient documentation

## 2015-11-21 DIAGNOSIS — Z7982 Long term (current) use of aspirin: Secondary | ICD-10-CM | POA: Diagnosis not present

## 2015-11-21 DIAGNOSIS — K7290 Hepatic failure, unspecified without coma: Secondary | ICD-10-CM | POA: Insufficient documentation

## 2015-11-21 DIAGNOSIS — K7682 Hepatic encephalopathy: Secondary | ICD-10-CM

## 2015-11-21 LAB — CBC WITH DIFFERENTIAL/PLATELET
Basophils Absolute: 0.1 10*3/uL (ref 0–0.1)
Basophils Relative: 1 %
Eosinophils Absolute: 0.2 10*3/uL (ref 0–0.7)
Eosinophils Relative: 3 %
HCT: 26.8 % — ABNORMAL LOW (ref 40.0–52.0)
Hemoglobin: 9.1 g/dL — ABNORMAL LOW (ref 13.0–18.0)
LYMPHS ABS: 1.4 10*3/uL (ref 1.0–3.6)
LYMPHS PCT: 23 %
MCH: 31.6 pg (ref 26.0–34.0)
MCHC: 34.1 g/dL (ref 32.0–36.0)
MCV: 92.6 fL (ref 80.0–100.0)
MONO ABS: 0.8 10*3/uL (ref 0.2–1.0)
MONOS PCT: 13 %
Neutro Abs: 3.8 10*3/uL (ref 1.4–6.5)
Neutrophils Relative %: 60 %
PLATELETS: 147 10*3/uL — AB (ref 150–440)
RBC: 2.89 MIL/uL — ABNORMAL LOW (ref 4.40–5.90)
RDW: 14.7 % — AB (ref 11.5–14.5)
WBC: 6.2 10*3/uL (ref 3.8–10.6)

## 2015-11-21 LAB — URINALYSIS COMPLETE WITH MICROSCOPIC (ARMC ONLY)
BACTERIA UA: NONE SEEN
BILIRUBIN URINE: NEGATIVE
BROAD CASTS UA: 2
GLUCOSE, UA: NEGATIVE mg/dL
KETONES UR: NEGATIVE mg/dL
Leukocytes, UA: NEGATIVE
Nitrite: NEGATIVE
PH: 5 (ref 5.0–8.0)
Protein, ur: NEGATIVE mg/dL
SPECIFIC GRAVITY, URINE: 1.013 (ref 1.005–1.030)
Squamous Epithelial / LPF: NONE SEEN

## 2015-11-21 LAB — COMPREHENSIVE METABOLIC PANEL
ALBUMIN: 3.2 g/dL — AB (ref 3.5–5.0)
ALK PHOS: 114 U/L (ref 38–126)
ALT: 68 U/L — AB (ref 17–63)
ANION GAP: 7 (ref 5–15)
AST: 47 U/L — ABNORMAL HIGH (ref 15–41)
BILIRUBIN TOTAL: 0.8 mg/dL (ref 0.3–1.2)
BUN: 19 mg/dL (ref 6–20)
CALCIUM: 9 mg/dL (ref 8.9–10.3)
CO2: 25 mmol/L (ref 22–32)
CREATININE: 1.12 mg/dL (ref 0.61–1.24)
Chloride: 109 mmol/L (ref 101–111)
GFR calc Af Amer: >60 mL/min (ref >60)
GFR calc non Af Amer: >60 mL/min (ref >60)
GLUCOSE: 111 mg/dL — AB (ref 65–99)
Potassium: 4.4 mmol/L (ref 3.5–5.1)
Sodium: 141 mmol/L (ref 135–145)
TOTAL PROTEIN: 7 g/dL (ref 6.5–8.1)

## 2015-11-21 LAB — LIPASE, BLOOD: Lipase: 30 U/L (ref 11–51)

## 2015-11-21 LAB — ETHANOL: Alcohol, Ethyl (B): <5 mg/dL (ref <5)

## 2015-11-21 LAB — GLUCOSE, CAPILLARY: Glucose-Capillary: 116 mg/dL — ABNORMAL HIGH (ref 65–99)

## 2015-11-21 LAB — AMMONIA: Ammonia: 59 umol/L — ABNORMAL HIGH (ref 9–35)

## 2015-11-21 LAB — PROTIME-INR
INR: 1.33
PROTHROMBIN TIME: 16.6 s — AB (ref 11.4–15.0)

## 2015-11-21 MED ORDER — LIDOCAINE-EPINEPHRINE (PF) 1 %-1:200000 IJ SOLN
INTRAMUSCULAR | Status: AC
  Filled 2015-11-21: qty 30

## 2015-11-21 MED ORDER — LACTULOSE 10 GM/15ML PO SOLN: 30.0000 g | Freq: Two times a day (BID) | ORAL | Status: DC

## 2015-11-21 MED ORDER — SODIUM CHLORIDE 0.9 % IV BOLUS (SEPSIS)
1000.0000 mL | Freq: Once | INTRAVENOUS | Status: AC
  Administered 2015-11-21: 1000 mL via INTRAVENOUS

## 2015-11-21 NOTE — ED Provider Notes (Signed)
[facility] Emergency Department Provider Note   ____________________________________________  Time seen: Approximately 10pm I have reviewed the triage vital signs and the triage nursing note.  HISTORY  Chief Complaint No chief complaint on file.   Historian Patient is a poor historian, due to a history of schizophrenia and his mom provide some additional history.  HPI Howard [name] is a 35 y.o. 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  He reports he felt weak all over, weak while standing and walking and a sense that his vision was closing in and that he might pass out.  He did not pass out. He has not been sick or ill recently with fever, cough. No abdominal pain or vomiting. He does have a history of COPD and chronic shortness of breath which is no different. Mom states that he is nearly out of his lactulose. Family is questioning whether or not his ammonia might be elevated. Again no fevers. He has some confusion at baseline, it's unclear whether or not there is any actual new confusion. There are no focal neurologic complaints of one-sided weakness or numbness or problems with speech.    Past Medical History  Diagnosis Date  . Hypertension   . Schizophrenia (HCC)   . Hyperlipemia   . Depression   . Diabetes (HCC)   . Liver disease   . Diabetes mellitus, type II (HCC)   . Heart disease   . Alcoholic cirrhosis of liver with ascites (HCC)   . Esophageal varices (HCC)   . Portal hypertensive gastropathy     Patient Active Problem List   Diagnosis Date Noted  . Fever 11/11/2015  . Ascites due to alcoholic cirrhosis (HCC) 11/11/2015  . Cirrhosis of liver with ascites (HCC) 11/11/2015  . Hepatic encephalopathy (HCC) 10/16/2015  . Type 2 diabetes mellitus (HCC) 10/16/2015  . Acute renal failure (ARF) (HCC) 07/30/2015  . Alcoholic cirrhosis of  liver with ascites (HCC) 07/19/2015  . Hypoxia 07/04/2015  . Elevated transaminase level 07/04/2015  . DOE (dyspnea on exertion) 07/04/2015  . Alcohol abuse 05/31/2015  . Hypertension 05/29/2015  . Paranoid schizophrenia (HCC) 03/20/2015    Past Surgical History  Procedure Laterality Date  . No past surgeries    . Esophagogastroduodenoscopy (egd) with propofol N/A 08/03/2015    Procedure: ESOPHAGOGASTRODUODENOSCOPY (EGD) WITH PROPOFOL;  Surgeon: Elnita Maxwell, MD;  Location: [facility] ENDOSCOPY;  Service: Endoscopy;  Laterality: N/A;  . Esophagogastroduodenoscopy (egd) with propofol N/A 08/31/2015    Procedure: ESOPHAGOGASTRODUODENOSCOPY (EGD) WITH PROPOFOL;  Surgeon: Elnita Maxwell, MD;  Location: [facility] ENDOSCOPY;  Service: Endoscopy;  Laterality: N/A;  . Esophagogastroduodenoscopy N/A 10/05/2015    Procedure: ESOPHAGOGASTRODUODENOSCOPY (EGD);  Surgeon: Elnita Maxwell, MD;  Location: [facility] ENDOSCOPY;  Service: Endoscopy;  Laterality: N/A;    Current Outpatient Rx  Name  Route  Sig  Dispense  Refill  . albuterol (PROVENTIL HFA;VENTOLIN HFA) 108 (90 BASE) MCG/ACT inhaler   Inhalation   Inhale 2 puffs into the lungs every 6 (six) hours as needed for wheezing or shortness of breath. Reported on 11/11/2015         . aspirin EC 81 MG tablet   Oral   Take 81 mg by mouth daily.          . citalopram (CELEXA) 20 MG tablet   Oral   Take 1 tablet (20 mg total) by mouth daily.   90  tablet   1   . diphenhydrAMINE (BENADRYL) 25 mg capsule      TAKE 2 CAPSULES BY MOUTH AT BEDTIME   60 capsule   2   . levofloxacin (LEVAQUIN) 500 MG tablet   Oral   Take 1 tablet (500 mg total) by mouth daily.   14 tablet   0   . losartan-hydrochlorothiazide (HYZAAR) 100-25 MG tablet   Oral   Take 1 tablet by mouth daily.          . metFORMIN (GLUCOPHAGE) 500 MG tablet   Oral   Take 1 tablet (500 mg total) by mouth 2 (two) times daily with a meal.   30 tablet   0   .  metoCLOPramide (REGLAN) 10 MG tablet   Oral   Take 1 tablet (10 mg total) by mouth 3 (three) times daily before meals.   20 tablet   0   . metoprolol tartrate (LOPRESSOR) 25 MG tablet   Oral   Take 1 tablet (25 mg total) by mouth 2 (two) times daily.   60 tablet   0   . Multiple Vitamin (MULTIVITAMIN WITH MINERALS) TABS tablet   Oral   Take 1 tablet by mouth daily.   30 tablet   0   . OLANZapine (ZYPREXA) 15 MG tablet   Oral   Take 1 tablet (15 mg total) by mouth at bedtime.   90 tablet   1   . omeprazole (PRILOSEC) 40 MG capsule   Oral   Take 40 mg by mouth every morning.          . ondansetron (ZOFRAN) 4 MG tablet   Oral   Take 1 tablet (4 mg total) by mouth daily as needed for nausea or vomiting.   20 tablet   1   . simvastatin (ZOCOR) 40 MG tablet   Oral   Take 40 mg by mouth at bedtime.          . traMADol (ULTRAM) 50 MG tablet   Oral   Take 1 tablet (50 mg total) by mouth every 6 (six) hours as needed.   20 tablet   0   . lactulose (CHRONULAC) 10 GM/15ML solution   Oral   Take 45 mLs (30 g total) by mouth 2 (two) times daily.   240 mL   0     Allergies Review of patient's allergies indicates no known allergies.  Family History  Problem Relation Age of Onset  . Heart disease Mother   . Hypertension Mother   . Hyperlipidemia Mother   . Stroke Father   . Heart attack Father   . Hypertension Father   . Heart disease Father   . Alcohol abuse Father   . Heart disease Brother     Social History Social History  Substance Use Topics  . Smoking status: Never Smoker   . Smokeless tobacco: Never Used  . Alcohol Use: No     Comment: last drink 35 days ago    Review of Systems  Constitutional: Negative for fever. Eyes: Negative for visual changes. He stated at one point time when he was standing up his vision seemed like it was closing in but now that's better. ENT: Negative for sore throat. Cardiovascular: Negative for chest  pain. Respiratory: Chronic shortness of breath. Gastrointestinal: Negative for abdominal pain, vomiting and diarrhea. Genitourinary: Negative for dysuria. Musculoskeletal: Negative for back pain. Skin: Negative for rash. Neurological: Negative for headache. 10 point Review of Systems otherwise negative  ____________________________________________   PHYSICAL EXAM:  VITAL SIGNS: ED Triage Vitals  Enc Vitals Group     BP 11/21/15 2119 119/44 mmHg     Pulse Rate 11/21/15 2119 73     Resp 11/21/15 2119 22     Temp 11/21/15 2119 98.7 F (37.1 C)     Temp Source 11/21/15 2119 Oral     SpO2 11/21/15 2119 96 %     Weight 11/21/15 2119 291 lb (131.997 kg)     Height 11/21/15 2119  (1.905 m)     Head Cir --      Peak Flow --      Pain Score 11/21/15 2121 0     Pain Loc --      Pain Edu? --      Excl. in GC? --      Constitutional: Alert and cooperative, poor historian. Well appearing overall and in no distress. HEENT   Head: Normocephalic and atraumatic.      Eyes: Conjunctivae are normal. PERRL. Normal extraocular movements.      Ears:         Nose: No congestion/rhinnorhea.   Mouth/Throat: Mucous membranes are moist.   Neck: No stridor. Cardiovascular/Chest: Normal rate, regular rhythm.  No murmurs, rubs, or gallops. Respiratory: Normal respiratory effort without tachypnea nor retractions. Breath sounds are clear and equal bilaterally. No wheezes/rales/rhonchi. Gastrointestinal: Soft. No distention, no guarding, no rebound. Nontender. Small fluid wave  Genitourinary/rectal:Deferred Musculoskeletal: Nontender with normal range of motion in all extremities. No joint effusions.  No lower extremity tenderness.  No edema. Neurologic:  Normal speech and language. No slurred speech. Cranial nerves II through X are intact. Some asterixis. Some fine tremor, but able to complete finger to nose bilaterally.  5 out of 5 strength in 4 extremities, with no sensory deficits  noted. Skin:  Skin is warm, dry and intact. No rash noted. Psychiatric:  Flat affect, no hallucinations.  ____________________________________________   EKG I, Governor Rooks, MD, the attending physician have personally viewed and interpreted all ECGs.  70 bpm. Normal sinus rhythm. Narrow QRS. Normal axis. Normal ST and T-wave ____________________________________________  LABS (pertinent positives/negatives)  Urinalysis negative except 6-30 red blood cells Conference a metabolic panel significant for AST 47 and a LT 68 Alcohol less than 5 Lipase 30 A blood count 6.2, he will 9.1 and platelet count 147 INR 1.23 Ammonia 59 ____________________________________________  RADIOLOGY All Xrays were viewed by me. Imaging interpreted by Radiologist.  None __________________________________________  PROCEDURES  Procedure(s) performed: None  Critical Care performed: None  ____________________________________________   ED COURSE / ASSESSMENT AND PLAN  Pertinent labs & imaging results that were available during my care of the patient were reviewed by me and considered in my medical decision making (see chart for details).   The patient describes the generalized weakness almost sounds like it may been orthostatic in nature. He feels mostly better now. Symptoms do not strike me as focal neurologic in terms of stroke or intracranial emergency.  Family states they mostly just wanted check his ammonia and make sure that that okay. They would also like a refill on lactulose.   Patient's exam and evaluation are essentially reassuring. His blood pressure was a little low on orthostatic standing was 99, line 105, when he came in a systolic was 110. We'll go ahead and let him have a liter of fluid.  His ammonia is actually as low as it has been. The rest of his laboratory studies are reassuring.  I discussed this with the family and they are okay discharging home tonight after fluid  bolus.    CONSULTATIONS:  None   Patient / Family / Caregiver informed of clinical course, medical decision-making process, and agree with plan.   I discussed return precautions, follow-up instructions, and discharged instructions with patient and/or family.   ___________________________________________   FINAL CLINICAL IMPRESSION(S) / ED DIAGNOSES   Final diagnoses:  Generalized weakness  Hepatic encephalopathy (HCC)              Note: This dictation was prepared with Dragon dictation. Any transcriptional errors that result from this process are unintentional   Governor Rooks, MD 11/21/15 2332

## 2015-11-21 NOTE — ED Notes (Signed)
Pt arrived to the ED accompanied by his mother for complaints of being weak, short of breath, confused and shaking. Pt states: "I think that my liver is acting up again, I have chronic cirrhosis and COPD." Pt is AOx in no apparent distress, Pt is soft spoken and speech is slow.

## 2015-11-21 NOTE — ED Notes (Signed)
Patient has been recently seen for similar problem. Patient denies alcohol use since 30+ days. Patients mother thinks his "blood levels" are off. Patient has bilateral hand tremor.

## 2015-11-21 NOTE — Discharge Instructions (Signed)
Return to the emergency department for any new or worsening condition including one-sided weakness or numbness, any new confusion, fever, trouble breathing, coordination problems, or any other symptoms concerning to you.   Weakness Weakness is a lack of strength. It may be felt all over the body (generalized) or in one specific part of the body (focal). Some causes of weakness can be serious. You may need further medical evaluation, especially if you are elderly or you have a history of immunosuppression (such as chemotherapy or HIV), kidney disease, heart disease, or diabetes. CAUSES  Weakness can be caused by many different things, including:  Infection.  Physical exhaustion.  Internal bleeding or other blood loss that results in a lack of red blood cells (anemia).  Dehydration. This cause is more common in elderly people.  Side effects or electrolyte abnormalities from medicines, such as pain medicines or sedatives.  Emotional distress, anxiety, or depression.  Circulation problems, especially severe peripheral arterial disease.  Heart disease, such as rapid atrial fibrillation, bradycardia, or heart failure.  Nervous system disorders, such as Guillain-Barr syndrome, multiple sclerosis, or stroke. DIAGNOSIS  To find the cause of your weakness, your caregiver will take your history and perform a physical exam. Lab tests or X-rays may also be ordered, if needed. TREATMENT  Treatment of weakness depends on the cause of your symptoms and can vary greatly. HOME CARE INSTRUCTIONS   Rest as needed.  Eat a well-balanced diet.  Try to get some exercise every day.  Only take over-the-counter or prescription medicines as directed by your caregiver. SEEK MEDICAL CARE IF:   Your weakness seems to be getting worse or spreads to other parts of your body.  You develop new aches or pains. SEEK IMMEDIATE MEDICAL CARE IF:   You cannot perform your normal daily activities, such as  getting dressed and feeding yourself.  You cannot walk up and down stairs, or you feel exhausted when you do so.  You have shortness of breath or chest pain.  You have difficulty moving parts of your body.  You have weakness in only one area of the body or on only one side of the body.  You have a fever.  You have trouble speaking or swallowing.  You cannot control your bladder or bowel movements.  You have black or bloody vomit or stools. MAKE SURE YOU:  Understand these instructions.  Will watch your condition.  Will get help right away if you are not doing well or get worse.   This information is not intended to replace advice given to you by your health care provider. Make sure you discuss any questions you have with your health care provider.   Document Released: 09/08/2005 Document Revised: 03/09/2012 Document Reviewed: 11/07/2011 Elsevier Interactive Patient Education [employer]

## 2015-11-22 NOTE — ED Notes (Signed)
Waiting on fluid bolus to complete before discharge.

## 2015-11-24 ENCOUNTER — Encounter: Payer: Self-pay | Admitting: Emergency Medicine

## 2015-11-24 ENCOUNTER — Emergency Department
Admission: EM | Admit: 2015-11-24 | Discharge: 2015-11-24 | Disposition: A | Discharge status: Home / Self Care | Payer: Medicare Other | Attending: Emergency Medicine | Admitting: Emergency Medicine

## 2015-11-24 DIAGNOSIS — R112 Nausea with vomiting, unspecified: Secondary | ICD-10-CM | POA: Diagnosis not present

## 2015-11-24 DIAGNOSIS — Z79899 Other long term (current) drug therapy: Secondary | ICD-10-CM | POA: Diagnosis not present

## 2015-11-24 DIAGNOSIS — Z7982 Long term (current) use of aspirin: Secondary | ICD-10-CM | POA: Insufficient documentation

## 2015-11-24 DIAGNOSIS — Z7984 Long term (current) use of oral hypoglycemic drugs: Secondary | ICD-10-CM | POA: Diagnosis not present

## 2015-11-24 DIAGNOSIS — F419 Anxiety disorder, unspecified: Secondary | ICD-10-CM | POA: Diagnosis not present

## 2015-11-24 DIAGNOSIS — E119 Type 2 diabetes mellitus without complications: Secondary | ICD-10-CM | POA: Insufficient documentation

## 2015-11-24 DIAGNOSIS — R531 Weakness: Secondary | ICD-10-CM | POA: Diagnosis not present

## 2015-11-24 DIAGNOSIS — Z7902 Long term (current) use of antithrombotics/antiplatelets: Secondary | ICD-10-CM | POA: Diagnosis not present

## 2015-11-24 DIAGNOSIS — I10 Essential (primary) hypertension: Secondary | ICD-10-CM | POA: Diagnosis not present

## 2015-11-24 LAB — CBC
HCT: 28.4 % — ABNORMAL LOW (ref 40.0–52.0)
Hemoglobin: 9.6 g/dL — ABNORMAL LOW (ref 13.0–18.0)
MCH: 31.7 pg (ref 26.0–34.0)
MCHC: 33.9 g/dL (ref 32.0–36.0)
MCV: 93.6 fL (ref 80.0–100.0)
PLATELETS: 151 10*3/uL (ref 150–440)
RBC: 3.03 MIL/uL — ABNORMAL LOW (ref 4.40–5.90)
RDW: 14.8 % — AB (ref 11.5–14.5)
WBC: 8.7 10*3/uL (ref 3.8–10.6)

## 2015-11-24 LAB — TROPONIN I: Troponin I: <0.03 ng/mL (ref <0.031)

## 2015-11-24 LAB — URINALYSIS COMPLETE WITH MICROSCOPIC (ARMC ONLY)
BILIRUBIN URINE: NEGATIVE
Bacteria, UA: NONE SEEN
GLUCOSE, UA: NEGATIVE mg/dL
Leukocytes, UA: NEGATIVE
Nitrite: NEGATIVE
PH: 5 (ref 5.0–8.0)
Protein, ur: NEGATIVE mg/dL
Specific Gravity, Urine: 1.019 (ref 1.005–1.030)
Squamous Epithelial / LPF: NONE SEEN

## 2015-11-24 LAB — BASIC METABOLIC PANEL
Anion gap: 8 (ref 5–15)
BUN: 18 mg/dL (ref 6–20)
CALCIUM: 8.9 mg/dL (ref 8.9–10.3)
CO2: 26 mmol/L (ref 22–32)
CREATININE: 1.2 mg/dL (ref 0.61–1.24)
Chloride: 105 mmol/L (ref 101–111)
GFR calc non Af Amer: >60 mL/min (ref >60)
GLUCOSE: 103 mg/dL — AB (ref 65–99)
Potassium: 3.9 mmol/L (ref 3.5–5.1)
Sodium: 139 mmol/L (ref 135–145)

## 2015-11-24 LAB — GLUCOSE, CAPILLARY: Glucose-Capillary: 99 mg/dL (ref 65–99)

## 2015-11-24 LAB — AMMONIA: AMMONIA: 67 umol/L — AB (ref 9–35)

## 2015-11-24 NOTE — ED Provider Notes (Signed)
[facility] Emergency Department Provider Note  ____________________________________________    I have reviewed the triage vital signs and the nursing notes.   HISTORY  Chief Complaint Weakness and Emesis    HPI Howard [name] is a 35 y.o. male who presents with complaints of nausea. He also complains of feeling tired. Patient is been seen multiple times in the emergency department over the last week for similar complaints. He appears to get very anxious and decides to come to the emergency department per his mother. He denies abdominal pain. He reports he typically comes to the emergency department for nausea is treated and feels better and then eats a heavy meal at home which at times makes him sick again. Currently he feels well. He has no nausea or vomiting. No abdominal pain.     Past Medical History  Diagnosis Date  . Hypertension   . Schizophrenia (HCC)   . Hyperlipemia   . Depression   . Diabetes (HCC)   . Liver disease   . Diabetes mellitus, type II (HCC)   . Heart disease   . Alcoholic cirrhosis of liver with ascites (HCC)   . Esophageal varices (HCC)   . Portal hypertensive gastropathy     Patient Active Problem List   Diagnosis Date Noted  . Fever 11/11/2015  . Ascites due to alcoholic cirrhosis (HCC) 11/11/2015  . Cirrhosis of liver with ascites (HCC) 11/11/2015  . Hepatic encephalopathy (HCC) 10/16/2015  . Type 2 diabetes mellitus (HCC) 10/16/2015  . Acute renal failure (ARF) (HCC) 07/30/2015  . 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 Hypoxia 07/04/2015  . Elevated transaminase level 07/04/2015  . DOE (dyspnea on exertion) 07/04/2015  . Alcohol abuse 05/31/2015  . Hypertension 05/29/2015  . Paranoid schizophrenia (HCC) 03/20/2015    Past Surgical History  Procedure Laterality Date  . No past surgeries    . Esophagogastroduodenoscopy (egd) with propofol N/A 08/03/2015    Procedure:  ESOPHAGOGASTRODUODENOSCOPY (EGD) WITH PROPOFOL;  Surgeon: Elnita MaxwellMatthew Gordon Rein, MD;  [facility];  Service: Endoscopy;  Laterality: N/A;  . Esophagogastroduodenoscopy (egd) with propofol N/A 08/31/2015    Procedure: ESOPHAGOGASTRODUODENOSCOPY (EGD) WITH PROPOFOL;  Surgeon: Elnita MaxwellMatthew Gordon Rein, MD;  [facility];  Service: Endoscopy;  Laterality: N/A;  . Esophagogastroduodenoscopy N/A 10/05/2015    Procedure: ESOPHAGOGASTRODUODENOSCOPY (EGD);  Surgeon: Elnita MaxwellMatthew Gordon Rein, MD;  [facility] ENDOSCOPY;  Service: Endoscopy;  Laterality: N/A;    Current Outpatient Rx  Name  Route  Sig  Dispense  Refill  . albuterol (PROVENTIL HFA;VENTOLIN HFA) 108 (90 BASE) MCG/ACT inhaler   Inhalation   Inhale 2 puffs into the lungs every 6 (six) hours as needed for wheezing or shortness of breath. Reported on 11/11/2015         . aspirin EC 81 MG tablet   Oral   Take 81 mg by mouth daily.          . citalopram (CELEXA) 20 MG tablet   Oral   Take 1 tablet (20 mg total) by mouth daily.   90 tablet   1   . diphenhydrAMINE (BENADRYL) 25 mg capsule      TAKE 2 CAPSULES BY MOUTH AT BEDTIME   60 capsule   2   . lactulose (CHRONULAC) 10 GM/15ML solution   Oral   Take 45 mLs (30 g total) by mouth 2 (two) times daily.   240 mL   0   . levofloxacin (LEVAQUIN) 500 MG tablet  Oral   Take 1 tablet (500 mg total) by mouth daily.   14 tablet   0   . losartan-hydrochlorothiazide (HYZAAR) 100-25 MG tablet   Oral   Take 1 tablet by mouth daily.          . metFORMIN (GLUCOPHAGE) 500 MG tablet   Oral   Take 1 tablet (500 mg total) by mouth 2 (two) times daily with a meal.   30 tablet   0   . metoCLOPramide (REGLAN) 10 MG tablet   Oral   Take 1 tablet (10 mg total) by mouth 3 (three) times daily before meals.   20 tablet   0   . metoprolol tartrate (LOPRESSOR) 25 MG tablet   Oral   Take 1 tablet (25 mg total) by mouth 2 (two) times daily.   60 tablet   0   .  Multiple Vitamin (MULTIVITAMIN WITH MINERALS) TABS tablet   Oral   Take 1 tablet by mouth daily.   30 tablet   0   . OLANZapine (ZYPREXA) 15 MG tablet   Oral   Take 1 tablet (15 mg total) by mouth at bedtime.   90 tablet   1   . omeprazole (PRILOSEC) 40 MG capsule   Oral   Take 40 mg by mouth every morning.          . ondansetron (ZOFRAN) 4 MG tablet   Oral   Take 1 tablet (4 mg total) by mouth daily as needed for nausea or vomiting.   20 tablet   1   . simvastatin (ZOCOR) 40 MG tablet   Oral   Take 40 mg by mouth at bedtime.          . traMADol (ULTRAM) 50 MG tablet   Oral   Take 1 tablet (50 mg total) by mouth every 6 (six) hours as needed.   20 tablet   0     Allergies Review of patient's allergies indicates no known allergies.  Family History  Problem Relation Age of Onset  . Heart disease Mother   . Hypertension Mother   . Hyperlipidemia Mother   . Stroke Father   . Heart attack Father   . Hypertension Father   . Heart disease Father   . Alcohol abuse Father   . Heart disease Brother     Social History Social History  Substance Use Topics  . Smoking status: Never Smoker   . Smokeless tobacco: Never Used  . Alcohol Use: No     Comment: last drink 35 days ago    Review of Systems  Constitutional: Negative for fever. Eyes: Negative for visual changes. ENT: Negative for sore throat Cardiovascular: Negative for chest pain. Respiratory: Negative for cough Gastrointestinal: Negative for abdominal pain Genitourinary: Negative for dysuria. Musculoskeletal: Negative for back pain. Skin: Negative for rash. Neurological: Negative for headaches Psychiatric: Mild anxiety    ____________________________________________   PHYSICAL EXAM:  VITAL SIGNS: ED Triage Vitals  Enc Vitals Group     BP 11/24/15 1759 117/60 mmHg     Pulse Rate 11/24/15 1759 77     Resp 11/24/15 1759 22     Temp 11/24/15 1759 98.5 F (36.9 C)     Temp Source  11/24/15 1759 Oral     SpO2 11/24/15 1759 97 %     Weight 11/24/15 1759 290 lb (131.543 kg)     Height 11/24/15 1759  (1.905 m)     Head Cir --  Peak Flow --      Pain Score 11/24/15 1801 6     Pain Loc --      Pain Edu? --      Excl. in GC? --      Constitutional: Alert and oriented. Well appearing and in no distress. Eyes: Conjunctivae are normal.  ENT   Head: Normocephalic and atraumatic.   Mouth/Throat: Mucous membranes are moist. Cardiovascular: Normal rate, regular rhythm. Normal and symmetric distal pulses are present in all extremities. No murmurs, rubs, or gallops. Respiratory: Normal respiratory effort without tachypnea nor retractions. Breath sounds are clear and equal bilaterally.  Gastrointestinal: Soft and non-tender in all quadrants. No distention. There is no CVA tenderness. Genitourinary: deferred Musculoskeletal: Nontender with normal range of motion in all extremities. No lower extremity tenderness nor edema. Neurologic:  Normal speech and language. No gross focal neurologic deficits are appreciated. Skin:  Skin is warm, dry and intact. No rash noted. Psychiatric: Mood and affect are normal. Patient exhibits appropriate insight and judgment.  ____________________________________________    LABS (pertinent positives/negatives)  Labs Reviewed  BASIC METABOLIC PANEL - Abnormal; Notable for the following:    Glucose, Bld 103 (*)    All other components within normal limits  CBC - Abnormal; Notable for the following:    RBC 3.03 (*)    Hemoglobin 9.6 (*)    HCT 28.4 (*)    RDW 14.8 (*)    All other components within normal limits  URINALYSIS COMPLETEWITH MICROSCOPIC ([facility] ONLY) - Abnormal; Notable for the following:    Color, Urine YELLOW (*)    APPearance CLEAR (*)    Ketones, ur TRACE (*)    Hgb urine dipstick 3+ (*)    All other components within normal limits  AMMONIA - Abnormal; Notable for the following:    Ammonia 67 (*)    All  other components within normal limits  TROPONIN I  GLUCOSE, CAPILLARY  CBG MONITORING, ED    ____________________________________________   EKG  None  ____________________________________________    RADIOLOGY I have personally reviewed any xrays that were ordered on this patient: None  ____________________________________________   PROCEDURES  Procedure(s) performed: none  Critical Care performed: none  ____________________________________________   INITIAL IMPRESSION / ASSESSMENT AND PLAN / ED COURSE  Pertinent labs & imaging results that were available during my care of the patient were reviewed by me and considered in my medical decision making (see chart for details).  Patient well-appearing and in no distress. His exam is completely benign. His lab work is unchanged from prior. Does have chronic hematuria which I again recommended be investigated by urology. He is currently asymptomatic. Do not feel any therapy is needed this time. Reassurance provided. Return precautions discussed  ____________________________________________   FINAL CLINICAL IMPRESSION(S) / ED DIAGNOSES  Final diagnoses:  Non-intractable vomiting with nausea, vomiting of unspecified type     Jene Every, MD 11/24/15 2306

## 2015-11-24 NOTE — ED Notes (Signed)
Patient to ER for c/o vomiting x2. Mother states patient was falling asleep while she was talking to him today. Patient has h/o having elevated ammonia levels. Patient arrives with nasal cannula in place (2L O2).

## 2015-11-24 NOTE — Discharge Instructions (Signed)

## 2015-11-25 ENCOUNTER — Other Ambulatory Visit: Payer: Self-pay

## 2015-11-25 ENCOUNTER — Emergency Department
Admission: EM | Admit: 2015-11-25 | Discharge: 2015-11-26 | Disposition: A | Discharge status: Home / Self Care | Payer: Medicare Other | Attending: Emergency Medicine | Admitting: Emergency Medicine

## 2015-11-25 ENCOUNTER — Encounter: Payer: Self-pay | Admitting: Emergency Medicine

## 2015-11-25 DIAGNOSIS — Y998 Other external cause status: Secondary | ICD-10-CM | POA: Diagnosis not present

## 2015-11-25 DIAGNOSIS — Y9389 Activity, other specified: Secondary | ICD-10-CM | POA: Diagnosis not present

## 2015-11-25 DIAGNOSIS — Z792 Long term (current) use of antibiotics: Secondary | ICD-10-CM | POA: Insufficient documentation

## 2015-11-25 DIAGNOSIS — Z79899 Other long term (current) drug therapy: Secondary | ICD-10-CM | POA: Insufficient documentation

## 2015-11-25 DIAGNOSIS — S299XXA Unspecified injury of thorax, initial encounter: Secondary | ICD-10-CM | POA: Insufficient documentation

## 2015-11-25 DIAGNOSIS — Z7982 Long term (current) use of aspirin: Secondary | ICD-10-CM | POA: Diagnosis not present

## 2015-11-25 DIAGNOSIS — E860 Dehydration: Secondary | ICD-10-CM | POA: Diagnosis not present

## 2015-11-25 DIAGNOSIS — E119 Type 2 diabetes mellitus without complications: Secondary | ICD-10-CM | POA: Insufficient documentation

## 2015-11-25 DIAGNOSIS — I10 Essential (primary) hypertension: Secondary | ICD-10-CM | POA: Diagnosis not present

## 2015-11-25 DIAGNOSIS — R42 Dizziness and giddiness: Secondary | ICD-10-CM | POA: Insufficient documentation

## 2015-11-25 DIAGNOSIS — Y9289 Other specified places as the place of occurrence of the external cause: Secondary | ICD-10-CM | POA: Insufficient documentation

## 2015-11-25 DIAGNOSIS — R55 Syncope and collapse: Secondary | ICD-10-CM

## 2015-11-25 DIAGNOSIS — W01198A Fall on same level from slipping, tripping and stumbling with subsequent striking against other object, initial encounter: Secondary | ICD-10-CM | POA: Diagnosis not present

## 2015-11-25 DIAGNOSIS — Z7984 Long term (current) use of oral hypoglycemic drugs: Secondary | ICD-10-CM | POA: Diagnosis not present

## 2015-11-25 LAB — CBC
HEMATOCRIT: 29.4 % — AB (ref 40.0–52.0)
Hemoglobin: 10 g/dL — ABNORMAL LOW (ref 13.0–18.0)
MCH: 31.8 pg (ref 26.0–34.0)
MCHC: 34.1 g/dL (ref 32.0–36.0)
MCV: 93.5 fL (ref 80.0–100.0)
PLATELETS: 151 10*3/uL (ref 150–440)
RBC: 3.15 MIL/uL — AB (ref 4.40–5.90)
RDW: 14.9 % — AB (ref 11.5–14.5)
WBC: 8 10*3/uL (ref 3.8–10.6)

## 2015-11-25 LAB — BASIC METABOLIC PANEL
Anion gap: 9 (ref 5–15)
BUN: 20 mg/dL (ref 6–20)
CHLORIDE: 103 mmol/L (ref 101–111)
CO2: 28 mmol/L (ref 22–32)
CREATININE: 1.56 mg/dL — AB (ref 0.61–1.24)
Calcium: 9.1 mg/dL (ref 8.9–10.3)
GFR calc Af Amer: >60 mL/min (ref >60)
GFR calc non Af Amer: 56 mL/min — ABNORMAL LOW (ref >60)
Glucose, Bld: 90 mg/dL (ref 65–99)
POTASSIUM: 4.2 mmol/L (ref 3.5–5.1)
SODIUM: 140 mmol/L (ref 135–145)

## 2015-11-25 LAB — TROPONIN I: Troponin I: <0.03 ng/mL (ref <0.031)

## 2015-11-25 MED ORDER — SODIUM CHLORIDE 0.9 % IV BOLUS (SEPSIS)
1000.0000 mL | Freq: Once | INTRAVENOUS | Status: AC
  Administered 2015-11-25: 1000 mL via INTRAVENOUS

## 2015-11-25 NOTE — ED Notes (Signed)
bp 95/48. Pt states takes metoprolol and hyzaar and had them both today already

## 2015-11-25 NOTE — ED Notes (Signed)
Pt in via triage with complaints of dizziness/weakness since yesterday.  Pt reports syncopal episode today, [facility], landing [facility], pt denies hitting head.  Pt A/Ox4, vitals WDL, no immediate distress at this time.

## 2015-11-25 NOTE — ED Notes (Signed)
States has been having near syncopal episodes and had syncopal episode at 2pm. States has had episodes in the past but not sure of the dx other than his liver cirrhosis

## 2015-11-26 DIAGNOSIS — R55 Syncope and collapse: Secondary | ICD-10-CM | POA: Diagnosis not present

## 2015-11-26 LAB — AMMONIA: Ammonia: 44 umol/L — ABNORMAL HIGH (ref 9–35)

## 2015-11-26 LAB — TROPONIN I: Troponin I: <0.03 ng/mL (ref <0.031)

## 2015-11-26 NOTE — ED Provider Notes (Signed)
[facility] Emergency Department Provider Note  ____________________________________________  Time seen: Approximately 2309 AM  I have reviewed the triage vital signs and the nursing notes.   HISTORY  Chief Complaint Loss of Consciousness    HPI Howard [name] is a 35 y.o. male who comes into the hospital today with a syncopal episode. The patient reports he was feeling lightheaded and like he was going to pass out most of the day today. He reports that he was walking around a department store with his mother when he became lightheaded and dizzy. The patient thinks he passed out for a few seconds. He was leaning against the shopping cart and reports that he hit the basket and fell onto his knees. The patient wears oxygen normally and the oxygen tank was in the cart. The patient reports that he feels fine as long as he is not moving but whenever he sits up or stands up he gets lightheaded and it seemed everything is getting dark. The patient did have a few episodes of chest pain today but it was before the syncopal episode. The patient reports that after his episode he drank some orange juice with a thought it could be due to his sugar and he also had multiple cups of water. His mom reports that he had not drank much water today and he only urinated once. He does have a history of liver cirrhosis and has had some elevated ammonias. He reports he has been taking his medications. The patient denies any vomiting or diarrhea and has a history of nausea which is unchanged. The patient denies any headache as well today. He is here for evaluation.   Past Medical History  Diagnosis Date  . Hypertension   . Schizophrenia (HCC)   . Hyperlipemia   . Depression   . Diabetes (HCC)   . Liver disease   . Diabetes mellitus, type II (HCC)   . Heart disease   . Alcoholic cirrhosis of liver with ascites (HCC)   . Esophageal varices (HCC)   . Portal hypertensive gastropathy      Patient Active Problem List   Diagnosis Date Noted  . Fever 11/11/2015  . Ascites due to alcoholic cirrhosis (HCC) 11/11/2015  . Cirrhosis of liver with ascites (HCC) 11/11/2015  . Hepatic encephalopathy (HCC) 10/16/2015  . Type 2 diabetes mellitus (HCC) 10/16/2015  . Acute renal failure (ARF) (HCC) 07/30/2015  . 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 Hypoxia 07/04/2015  . Elevated transaminase level 07/04/2015  . DOE (dyspnea on exertion) 07/04/2015  . Alcohol abuse 05/31/2015  . Hypertension 05/29/2015  . Paranoid schizophrenia (HCC) 03/20/2015    Past Surgical History  Procedure Laterality Date  . No past surgeries    . Esophagogastroduodenoscopy (egd) with propofol N/A 08/03/2015    Procedure: ESOPHAGOGASTRODUODENOSCOPY (EGD) WITH PROPOFOL;  Surgeon: Elnita Maxwell, MD;  Location: [facility] ENDOSCOPY;  Service: Endoscopy;  Laterality: N/A;  . Esophagogastroduodenoscopy (egd) with propofol N/A 08/31/2015    Procedure: ESOPHAGOGASTRODUODENOSCOPY (EGD) WITH PROPOFOL;  Surgeon: Elnita Maxwell, MD;  Location: [facility] ENDOSCOPY;  Service: Endoscopy;  Laterality: N/A;  . Esophagogastroduodenoscopy N/A 10/05/2015    Procedure: ESOPHAGOGASTRODUODENOSCOPY (EGD);  Surgeon: Elnita Maxwell, MD;  Location: [facility] ENDOSCOPY;  Service: Endoscopy;  Laterality: N/A;    Current Outpatient Rx  Name  Route  Sig  Dispense  Refill  . albuterol (PROVENTIL HFA;VENTOLIN HFA) 108 (90 BASE) MCG/ACT inhaler   Inhalation   Inhale 2 puffs into  the lungs every 6 (six) hours as needed for wheezing or shortness of breath. Reported on 11/11/2015         . aspirin EC 81 MG tablet   Oral   Take 81 mg by mouth daily.          . citalopram (CELEXA) 20 MG tablet   Oral   Take 1 tablet (20 mg total) by mouth daily.   90 tablet   1   . diphenhydrAMINE (BENADRYL) 25 mg capsule      TAKE 2 CAPSULES BY MOUTH AT BEDTIME   60 capsule   2   . lactulose (CHRONULAC) 10 GM/15ML  solution   Oral   Take 45 mLs (30 g total) by mouth 2 (two) times daily.   240 mL   0   . levofloxacin (LEVAQUIN) 500 MG tablet   Oral   Take 1 tablet (500 mg total) by mouth daily.   14 tablet   0   . losartan-hydrochlorothiazide (HYZAAR) 100-25 MG tablet   Oral   Take 1 tablet by mouth daily.          . metFORMIN (GLUCOPHAGE) 500 MG tablet   Oral   Take 1 tablet (500 mg total) by mouth 2 (two) times daily with a meal.   30 tablet   0   . metoCLOPramide (REGLAN) 10 MG tablet   Oral   Take 1 tablet (10 mg total) by mouth 3 (three) times daily before meals.   20 tablet   0   . metoprolol tartrate (LOPRESSOR) 25 MG tablet   Oral   Take 1 tablet (25 mg total) by mouth 2 (two) times daily.   60 tablet   0   . Multiple Vitamin (MULTIVITAMIN WITH MINERALS) TABS tablet   Oral   Take 1 tablet by mouth daily.   30 tablet   0   . OLANZapine (ZYPREXA) 15 MG tablet   Oral   Take 1 tablet (15 mg total) by mouth at bedtime.   90 tablet   1   . omeprazole (PRILOSEC) 40 MG capsule   Oral   Take 40 mg by mouth every morning.          . ondansetron (ZOFRAN) 4 MG tablet   Oral   Take 1 tablet (4 mg total) by mouth daily as needed for nausea or vomiting.   20 tablet   1   . simvastatin (ZOCOR) 40 MG tablet   Oral   Take 40 mg by mouth at bedtime.          . traMADol (ULTRAM) 50 MG tablet   Oral   Take 1 tablet (50 mg total) by mouth every 6 (six) hours as needed.   20 tablet   0     Allergies Review of patient's allergies indicates no known allergies.  Family History  Problem Relation Age of Onset  . Heart disease Mother   . Hypertension Mother   . Hyperlipidemia Mother   . Stroke Father   . Heart attack Father   . Hypertension Father   . Heart disease Father   . Alcohol abuse Father   . Heart disease Brother     Social History Social History  Substance Use Topics  . Smoking status: Never Smoker   . Smokeless tobacco: Never Used  . Alcohol  Use: No     Comment: last drink 35 days ago    Review of Systems Constitutional: No fever/chills Eyes: No visual  changes. ENT: No sore throat. Cardiovascular:chest pain. Respiratory: Denies shortness of breath. Gastrointestinal: No abdominal pain.  No nausea, no vomiting.  No diarrhea.  No constipation. Genitourinary: Negative for dysuria. Musculoskeletal: Negative for back pain. Skin: Negative for rash. Neurological: Syncope and lightheadedness  10-point ROS otherwise negative.  ____________________________________________   PHYSICAL EXAM:  VITAL SIGNS: ED Triage Vitals  Enc Vitals Group     BP 11/25/15 1650 95/48 mmHg     Pulse Rate 11/25/15 1650 74     Resp 11/25/15 1650 18     Temp 11/25/15 1650 98.2 F (36.8 C)     Temp Source 11/25/15 1650 Oral     SpO2 11/25/15 1650 97 %     Weight 11/25/15 1650 290 lb (131.543 kg)     Height 11/25/15 1650  (1.905 m)     Head Cir --      Peak Flow --      Pain Score 11/25/15 1659 0     Pain Loc --      Pain Edu? --      Excl. in GC? --     Constitutional: Alert and oriented. Well appearing and in no acute distress. Eyes: Conjunctivae are normal. PERRL. EOMI. Head: Atraumatic. Nose: No congestion/rhinnorhea. Mouth/Throat: Mucous membranes are moist.  Oropharynx non-erythematous. Cardiovascular: Normal rate, regular rhythm. Grossly normal heart sounds.  Good peripheral circulation. Respiratory: Normal respiratory effort.  No retractions. Lungs CTAB. Gastrointestinal: Soft and nontender. No distention. Positive bowel sounds Musculoskeletal: No lower extremity tenderness nor edema.   Neurologic:  Normal speech and language. Cranial nerves II through XII are grossly intact, no focal motor or neuro deficits. Skin:  Skin is warm, dry and intact.  Psychiatric: Mood and affect are normal.   ____________________________________________   LABS (all labs ordered are listed, but only abnormal results are displayed)  Labs  Reviewed  BASIC METABOLIC PANEL - Abnormal; Notable for the following:    Creatinine, Ser 1.56 (*)    GFR calc non Af Amer 56 (*)    All other components within normal limits  CBC - Abnormal; Notable for the following:    RBC 3.15 (*)    Hemoglobin 10.0 (*)    HCT 29.4 (*)    RDW 14.9 (*)    All other components within normal limits  AMMONIA - Abnormal; Notable for the following:    Ammonia 44 (*)    All other components within normal limits  TROPONIN I  TROPONIN I   ____________________________________________  EKG  ED ECG REPORT I, Rebecka Apley, the attending physician, personally viewed and interpreted this ECG.   Date: 11/25/2015  EKG Time: 1705  Rate: 71  Rhythm: normal sinus rhythm  Axis: Normal  Intervals:none  ST&T Change: none  ____________________________________________  RADIOLOGY  None ____________________________________________   PROCEDURES  Procedure(s) performed: None  Critical Care performed: No  ____________________________________________   INITIAL IMPRESSION / ASSESSMENT AND PLAN / ED COURSE  Pertinent labs & imaging results that were available during my care of the patient were reviewed by me and considered in my medical decision making (see chart for details).  The patient is a 35 year old male who came in with an episode of syncope today. He did have 2 troponins done that were negative. His ammonia was elevated at 44 but in times past he's been in the 60s. The patient does have some mildly elevated creatinine which is concerning for possible dehydration. We did perform orthostatic vital signs but they were  unremarkable. The patient reports though he had a ready drink multiple cups of water prior to the vital signs being performed. The patient reports the symptoms are improved and he is having no complaints or concerns at this time. The patient will be discharged home to follow up with his primary care  physician. ____________________________________________   FINAL CLINICAL IMPRESSION(S) / ED DIAGNOSES  Final diagnoses:  Syncope, unspecified syncope type  Dehydration      Rebecka ApleyAllison P Aulden Calise, MD 11/26/15 0301

## 2015-11-26 NOTE — Discharge Instructions (Signed)
Dehydration, Adult °Dehydration is a condition in which you do not have enough fluid or water in your body. It happens when you take in less fluid than you lose. Vital organs such as the kidneys, brain, and heart cannot function without a proper amount of fluids. Any loss of fluids from the body can cause dehydration.  °Dehydration can range from mild to severe. This condition should be treated right away to help prevent it from becoming severe. °CAUSES  °This condition may be caused by: °· Vomiting. °· Diarrhea. °· Excessive sweating, such as when exercising in hot or humid weather. °· Not drinking enough fluid during strenuous exercise or during an illness. °· Excessive urine output. °· Fever. °· Certain medicines. °RISK FACTORS °This condition is more likely to develop in: °· People who are taking certain medicines that cause the body to lose excess fluid (diuretics).   °· People who have a chronic illness, such as diabetes, that may increase urination. °· Older adults.   °· People who live at high altitudes.   °· People who participate in endurance sports.   °SYMPTOMS  °Mild Dehydration °· Thirst. °· Dry lips. °· Slightly dry mouth. °· Dry, warm skin. °Moderate Dehydration °· Very dry mouth.   °· Muscle cramps.   °· Dark urine and decreased urine production.   °· Decreased tear production.   °· Headache.   °· Light-headedness, especially when you stand up from a sitting position.   °Severe Dehydration °· Changes in skin.   °· Cold and clammy skin.   °· Skin does not spring back quickly when lightly pinched and released.   °· Changes in body fluids.   °· Extreme thirst.   °· No tears.   °· Not able to sweat when body temperature is high, such as in hot weather.   °· Minimal urine production.   °· Changes in vital signs.   °· Rapid, weak pulse (more than 100 beats per minute when you are sitting still).   °· Rapid breathing.   °· Low blood pressure.   °· Other changes.   °· Sunken eyes.   °· Cold hands and feet.    °· Confusion. °· Lethargy and difficulty being awakened. °· Fainting (syncope).   °· Short-term weight loss.   °· Unconsciousness. °DIAGNOSIS  °This condition may be diagnosed based on your symptoms. You may also have tests to determine how severe your dehydration is. These tests may include:  °· Urine tests.   °· Blood tests.   °TREATMENT  °Treatment for this condition depends on the severity. Mild or moderate dehydration can often be treated at home. Treatment should be started right away. Do not wait until dehydration becomes severe. Severe dehydration needs to be treated at the hospital. °Treatment for Mild Dehydration °· Drinking plenty of water to replace the fluid you have lost.   °· Replacing minerals in your blood (electrolytes) that you may have lost.   °Treatment for Moderate Dehydration  °· Consuming oral rehydration solution (ORS). °Treatment for Severe Dehydration °· Receiving fluid through an IV tube.   °· Receiving electrolyte solution through a feeding tube that is passed through your nose and into your stomach (nasogastric tube or NG tube). °· Correcting any abnormalities in electrolytes. °HOME CARE INSTRUCTIONS  °· Drink enough fluid to keep your urine clear or pale yellow.   °· Drink water or fluid slowly by taking small sips. You can also try sucking on ice cubes.  °· Have food or beverages that contain electrolytes. Examples include bananas and sports drinks. °· Take over-the-counter and prescription medicines only as told by your health care provider.   °· Prepare ORS according to the manufacturer's instructions. Take sips   of ORS every 5 minutes until your urine returns to normal. °· If you have vomiting or diarrhea, continue to try to drink water, ORS, or both.   °· If you have diarrhea, avoid:   °¨ Beverages that contain caffeine.   °¨ Fruit juice.   °¨ Milk.    °¨ Carbonated soft drinks. °· Do not take salt tablets. This can lead to the condition of having too much sodium in your body  (hypernatremia).   °SEEK MEDICAL CARE IF: °· You cannot eat or drink without vomiting. °· You have had moderate diarrhea during a period of more than 24 hours. °· You have a fever. °SEEK IMMEDIATE MEDICAL CARE IF:  °· You have extreme thirst. °· You have severe diarrhea. °· You have not urinated in 6-8 hours, or you have urinated only a small amount of very dark urine. °· You have shriveled skin. °· You are dizzy, confused, or both. °  °This information is not intended to replace advice given to you by your health care provider. Make sure you discuss any questions you have with your health care provider. °  °Document Released: 09/08/2005 Document Revised: 05/30/2015 Document Reviewed: 01/24/2015 °Elsevier Interactive Patient Education ©2016 [employer] ° °Syncope °Syncope is a medical term for fainting or passing out. This means you lose consciousness and drop to the ground. People are generally unconscious for less than 5 minutes. You may have some muscle twitches for up to 15 seconds before waking up and returning to normal. Syncope occurs more often in older adults, but it can happen to anyone. While most causes of syncope are not dangerous, syncope can be a sign of a serious medical problem. It is important to seek medical care.  °CAUSES  °Syncope is caused by a sudden drop in blood flow to the brain. The specific cause is often not determined. Factors that can bring on syncope include: °· Taking medicines that lower blood pressure. °· Sudden changes in posture, such as standing up quickly. °· Taking more medicine than prescribed. °· Standing in one place for too long. °· Seizure disorders. °· Dehydration and excessive exposure to heat. °· Low blood sugar (hypoglycemia). °· Straining to have a bowel movement. °· Heart disease, irregular heartbeat, or other circulatory problems. °· Fear, emotional distress, seeing blood, or severe pain. °SYMPTOMS  °Right before fainting, you may: °· Feel dizzy or  light-headed. °· Feel nauseous. °· See all white or all black in your field of vision. °· Have cold, clammy skin. °DIAGNOSIS  °Your health care provider will ask about your symptoms, perform a physical exam, and perform an electrocardiogram (ECG) to record the electrical activity of your heart. Your health care provider may also perform other heart or blood tests to determine the cause of your syncope which may include: °· Transthoracic echocardiogram (TTE). During echocardiography, sound waves are used to evaluate how blood flows through your heart. °· Transesophageal echocardiogram (TEE). °· Cardiac monitoring. This allows your health care provider to monitor your heart rate and rhythm in real time. °· Holter monitor. This is a portable device that records your heartbeat and can help diagnose heart arrhythmias. It allows your health care provider to track your heart activity for several days, if needed. °· Stress tests by exercise or by giving medicine that makes the heart beat faster. °TREATMENT  °In most cases, no treatment is needed. Depending on the cause of your syncope, your health care provider may recommend changing or stopping some of your medicines. °HOME CARE INSTRUCTIONS °· Have someone stay   with you until you feel stable. °· Do not drive, use machinery, or play sports until your health care provider says it is okay. °· Keep all follow-up appointments as directed by your health care provider. °· Lie down right away if you start feeling like you might faint. Breathe deeply and steadily. Wait until all the symptoms have passed. °· Drink enough fluids to keep your urine clear or pale yellow. °· If you are taking blood pressure or heart medicine, get up slowly and take several minutes to sit and then stand. This can reduce dizziness. °SEEK IMMEDIATE MEDICAL CARE IF:  °· You have a severe headache. °· You have unusual pain in the chest, abdomen, or back. °· You are bleeding from your mouth or rectum, or you  have black or tarry stool. °· You have an irregular or very fast heartbeat. °· You have pain with breathing. °· You have repeated fainting or seizure-like jerking during an episode. °· You faint when sitting or lying down. °· You have confusion. °· You have trouble walking. °· You have severe weakness. °· You have vision problems. °If you fainted, call your local emergency services (911 in [place] Do not drive yourself to the hospital.  °  °This information is not intended to replace advice given to you by your health care provider. Make sure you discuss any questions you have with your health care provider. °  °Document Released: 09/08/2005 Document Revised: 01/23/2015 Document Reviewed: 11/07/2011 °Elsevier Interactive Patient Education ©2016 [employer] ° °

## 2015-12-02 ENCOUNTER — Emergency Department
Admission: EM | Admit: 2015-12-02 | Discharge: 2015-12-02 | Disposition: A | Discharge status: Home / Self Care | Payer: Medicare Other | Attending: Emergency Medicine | Admitting: Emergency Medicine

## 2015-12-02 DIAGNOSIS — Z7984 Long term (current) use of oral hypoglycemic drugs: Secondary | ICD-10-CM | POA: Diagnosis not present

## 2015-12-02 DIAGNOSIS — R42 Dizziness and giddiness: Secondary | ICD-10-CM | POA: Diagnosis not present

## 2015-12-02 DIAGNOSIS — E785 Hyperlipidemia, unspecified: Secondary | ICD-10-CM | POA: Diagnosis not present

## 2015-12-02 DIAGNOSIS — I119 Hypertensive heart disease without heart failure: Secondary | ICD-10-CM | POA: Insufficient documentation

## 2015-12-02 DIAGNOSIS — K7031 Alcoholic cirrhosis of liver with ascites: Secondary | ICD-10-CM | POA: Insufficient documentation

## 2015-12-02 DIAGNOSIS — R319 Hematuria, unspecified: Secondary | ICD-10-CM | POA: Diagnosis present

## 2015-12-02 DIAGNOSIS — J449 Chronic obstructive pulmonary disease, unspecified: Secondary | ICD-10-CM | POA: Diagnosis not present

## 2015-12-02 DIAGNOSIS — Z79899 Other long term (current) drug therapy: Secondary | ICD-10-CM | POA: Diagnosis not present

## 2015-12-02 DIAGNOSIS — F329 Major depressive disorder, single episode, unspecified: Secondary | ICD-10-CM | POA: Diagnosis not present

## 2015-12-02 DIAGNOSIS — F209 Schizophrenia, unspecified: Secondary | ICD-10-CM | POA: Diagnosis not present

## 2015-12-02 DIAGNOSIS — R55 Syncope and collapse: Secondary | ICD-10-CM | POA: Diagnosis not present

## 2015-12-02 DIAGNOSIS — E119 Type 2 diabetes mellitus without complications: Secondary | ICD-10-CM | POA: Diagnosis not present

## 2015-12-02 HISTORY — DX: Chronic obstructive pulmonary disease, unspecified: J44.9

## 2015-12-02 LAB — CBC
HCT: 28.9 % — ABNORMAL LOW (ref 40.0–52.0)
HEMOGLOBIN: 9.8 g/dL — AB (ref 13.0–18.0)
MCH: 31.8 pg (ref 26.0–34.0)
MCHC: 34 g/dL (ref 32.0–36.0)
MCV: 93.4 fL (ref 80.0–100.0)
PLATELETS: 122 10*3/uL — AB (ref 150–440)
RBC: 3.09 MIL/uL — AB (ref 4.40–5.90)
RDW: 14.9 % — ABNORMAL HIGH (ref 11.5–14.5)
WBC: 6.1 10*3/uL (ref 3.8–10.6)

## 2015-12-02 LAB — URINALYSIS COMPLETE WITH MICROSCOPIC (ARMC ONLY)
Bacteria, UA: NONE SEEN
Bilirubin Urine: NEGATIVE
Glucose, UA: NEGATIVE mg/dL
KETONES UR: NEGATIVE mg/dL
LEUKOCYTES UA: NEGATIVE
NITRITE: NEGATIVE
PROTEIN: NEGATIVE mg/dL
SPECIFIC GRAVITY, URINE: 1.02 (ref 1.005–1.030)
Squamous Epithelial / LPF: NONE SEEN
pH: 6 (ref 5.0–8.0)

## 2015-12-02 LAB — BASIC METABOLIC PANEL
ANION GAP: 6 (ref 5–15)
BUN: 19 mg/dL (ref 6–20)
CHLORIDE: 105 mmol/L (ref 101–111)
CO2: 27 mmol/L (ref 22–32)
CREATININE: 1.14 mg/dL (ref 0.61–1.24)
Calcium: 8.7 mg/dL — ABNORMAL LOW (ref 8.9–10.3)
GFR calc non Af Amer: >60 mL/min (ref >60)
Glucose, Bld: 149 mg/dL — ABNORMAL HIGH (ref 65–99)
POTASSIUM: 4 mmol/L (ref 3.5–5.1)
Sodium: 138 mmol/L (ref 135–145)

## 2015-12-02 LAB — MAGNESIUM: Magnesium: 1.8 mg/dL (ref 1.7–2.4)

## 2015-12-02 LAB — TROPONIN I: Troponin I: <0.03 ng/mL (ref <0.031)

## 2015-12-02 NOTE — ED Notes (Signed)
Pt reports intermittent near syncopal episodes X 1 week, "one every few days". Pt also reports scant blood in urine X 1 this AM and left flank pain. Pt denies syncope, CP or increased SOB beyond baseline.

## 2015-12-02 NOTE — Discharge Instructions (Signed)
Dizziness Dizziness is a common problem. It is a feeling of unsteadiness or light-headedness. You may feel like you are about to faint. Dizziness can lead to injury if you stumble or fall. Anyone can become dizzy, but dizziness is more common in older adults. This condition can be caused by a number of things, including medicines, dehydration, or illness. HOME CARE INSTRUCTIONS Taking these steps may help with your condition: Eating and Drinking  Drink enough fluid to keep your urine clear or pale yellow. This helps to keep you from becoming dehydrated. Try to drink more clear fluids, such as water.  Do not drink alcohol.  Limit your caffeine intake if directed by your health care provider.  Limit your salt intake if directed by your health care provider. Activity  Avoid making quick movements.  Rise slowly from chairs and steady yourself until you feel okay.  In the morning, first sit up on the side of the bed. When you feel okay, stand slowly while you hold onto something until you know that your balance is fine.  Move your legs often if you need to stand in one place for a long time. Tighten and relax your muscles in your legs while you are standing.  Do not drive or operate heavy machinery if you feel dizzy.  Avoid bending down if you feel dizzy. Place items in your home so that they are easy for you to reach without leaning over. Lifestyle  Do not use any tobacco products, including cigarettes, chewing tobacco, or electronic cigarettes. If you need help quitting, ask your health care provider.  Try to reduce your stress level, such as with yoga or meditation. Talk with your health care provider if you need help. General Instructions  Watch your dizziness for any changes.  Take medicines only as directed by your health care provider. Talk with your health care provider if you think that your dizziness is caused by a medicine that you are taking.  Tell a friend or a family  member that you are feeling dizzy. If he or she notices any changes in your behavior, have this person call your health care provider.  Keep all follow-up visits as directed by your health care provider. This is important. SEEK MEDICAL CARE IF:  Your dizziness does not go away.  Your dizziness or light-headedness gets worse.  You feel nauseous.  You have reduced hearing.  You have new symptoms.  You are unsteady on your feet or you feel like the room is spinning. SEEK IMMEDIATE MEDICAL CARE IF:  You vomit or have diarrhea and are unable to eat or drink anything.  You have problems talking, walking, swallowing, or using your arms, hands, or legs.  You feel generally weak.  You are not thinking clearly or you have trouble forming sentences. It may take a friend or family member to notice this.  You have chest pain, abdominal pain, shortness of breath, or sweating.  Your vision changes.  You notice any bleeding.  You have a headache.  You have neck pain or a stiff neck.  You have a fever.   This information is not intended to replace advice given to you by your health care provider. Make sure you discuss any questions you have with your health care provider.   Document Released: 03/04/2001 Document Revised: 01/23/2015 Document Reviewed: 09/04/2014 Elsevier Interactive Patient Education 2016 [employer]  Hematuria, Adult Hematuria is blood in your urine. It can be caused by a bladder infection, kidney infection,  prostate infection, kidney stone, or cancer of your urinary tract. Infections can usually be treated with medicine, and a kidney stone usually will pass through your urine. If neither of these is the cause of your hematuria, further workup to find out the reason may be needed. It is very important that you tell your health care provider about any blood you see in your urine, even if the blood stops without treatment or happens without causing pain. Blood in your  urine that happens and then stops and then happens again can be a symptom of a very serious condition. Also, pain is not a symptom in the initial stages of many urinary cancers. HOME CARE INSTRUCTIONS   Drink lots of fluid, 3-4 quarts a day. If you have been diagnosed with an infection, cranberry juice is especially recommended, in addition to large amounts of water.  Avoid caffeine, tea, and carbonated beverages because they tend to irritate the bladder.  Avoid alcohol because it may irritate the prostate.  Take all medicines as directed by your health care provider.  If you were prescribed an antibiotic medicine, finish it all even if you start to feel better.  If you have been diagnosed with a kidney stone, follow your health care provider's instructions regarding straining your urine to catch the stone.  Empty your bladder often. Avoid holding urine for long periods of time.  After a bowel movement, women should cleanse front to back. Use each tissue only once.  Empty your bladder before and after sexual intercourse if you are a male. SEEK MEDICAL CARE IF:  You develop back pain.  You have a fever.  You have a feeling of sickness in your stomach (nausea) or vomiting.  Your symptoms are not better in 3 days. Return sooner if you are getting worse. SEEK IMMEDIATE MEDICAL CARE IF:   You develop severe vomiting and are unable to keep the medicine down.  You develop severe back or abdominal pain despite taking your medicines.  You begin passing a large amount of blood or clots in your urine.  You feel extremely weak or faint, or you pass out. MAKE SURE YOU:   Understand these instructions.  Will watch your condition.  Will get help right away if you are not doing well or get worse.   This information is not intended to replace advice given to you by your health care provider. Make sure you discuss any questions you have with your health care provider.   Document  Released: 09/08/2005 Document Revised: 09/29/2014 Document Reviewed: 05/09/2013 Elsevier Interactive Patient Education [employer]

## 2015-12-02 NOTE — ED Notes (Signed)
Pt placed on 2L [place] of hospital oxygen in order to save his portable oxygen.

## 2015-12-02 NOTE — ED Provider Notes (Signed)
[facility] Emergency Department Provider Note     Time seen: ----------------------------------------- 6:26 PM on 12/02/2015 -----------------------------------------    I have reviewed the triage vital signs and the nursing notes.   HISTORY  Chief Complaint Near Syncope and Hematuria    HPI Howard [name] is a 35 y.o. male well-known to the ER who Presents for multiple complaints. Patient with complaints of near syncope with standing and walking around. He also has some blood in his urine. Patient does not remember if this happen before. He's been seen here multiple times for syncope or near-syncope. Patient denies any other issues or complaints at this time.   Past Medical History  Diagnosis Date  . Hypertension   . Schizophrenia (HCC)   . Hyperlipemia   . Depression   . Diabetes (HCC)   . Liver disease   . Diabetes mellitus, type II (HCC)   . Heart disease   . Alcoholic cirrhosis of liver with ascites (HCC)   . Esophageal varices (HCC)   . Portal hypertensive gastropathy   . COPD (chronic obstructive pulmonary disease) [facility])     Patient Active Problem List   Diagnosis Date Noted  . Fever 11/11/2015  . Ascites due to alcoholic cirrhosis (HCC) 11/11/2015  . Cirrhosis of liver with ascites (HCC) 11/11/2015  . Hepatic encephalopathy (HCC) 10/16/2015  . Type 2 diabetes mellitus (HCC) 10/16/2015  . Acute renal failure (ARF) (HCC) 07/30/2015  . 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 Hypoxia 07/04/2015  . Elevated transaminase level 07/04/2015  . DOE (dyspnea on exertion) 07/04/2015  . Alcohol abuse 05/31/2015  . Hypertension 05/29/2015  . Paranoid schizophrenia (HCC) 03/20/2015    Past Surgical History  Procedure Laterality Date  . No past surgeries    . Esophagogastroduodenoscopy (egd) with propofol N/A 08/03/2015    Procedure: ESOPHAGOGASTRODUODENOSCOPY (EGD) WITH PROPOFOL;  Surgeon: Elnita Maxwell, MD;   Location: [facility] ENDOSCOPY;  Service: Endoscopy;  Laterality: N/A;  . Esophagogastroduodenoscopy (egd) with propofol N/A 08/31/2015    Procedure: ESOPHAGOGASTRODUODENOSCOPY (EGD) WITH PROPOFOL;  Surgeon: Elnita Maxwell, MD;  Location: [facility] ENDOSCOPY;  Service: Endoscopy;  Laterality: N/A;  . Esophagogastroduodenoscopy N/A 10/05/2015    Procedure: ESOPHAGOGASTRODUODENOSCOPY (EGD);  Surgeon: Elnita Maxwell, MD;  Location: [facility] ENDOSCOPY;  Service: Endoscopy;  Laterality: N/A;    Allergies Review of patient's allergies indicates no known allergies.  Social History Social History  Substance Use Topics  . Smoking status: Never Smoker   . Smokeless tobacco: Never Used  . Alcohol Use: No     Comment: last drink 35 days ago    Review of Systems Constitutional: Negative for fever. Eyes: Negative for visual changes. ENT: Negative for sore throat. Cardiovascular: Negative for chest pain. Respiratory: Negative for shortness of breath. Gastrointestinal: Negative for abdominal pain, vomiting and diarrhea. Genitourinary: Negative for dysuria. Positive for hematuria Musculoskeletal: Negative for back pain. Skin: Negative for rash. Neurological: Negative for headaches, positive for weakness  10-point ROS otherwise negative.  ____________________________________________   PHYSICAL EXAM:  VITAL SIGNS: ED Triage Vitals  Enc Vitals Group     BP 12/02/15 1607 123/59 mmHg     Pulse Rate 12/02/15 1607 75     Resp 12/02/15 1607 18     Temp 12/02/15 1607 98.5 F (36.9 C)     Temp Source 12/02/15 1607 Oral     SpO2 12/02/15 1607 97 %     Weight 12/02/15 1607 290 lb (131.543 kg)  Height --      Head Cir --      Peak Flow --      Pain Score 12/02/15 1608 6     Pain Loc --      Pain Edu? --      Excl. in GC? --     Constitutional: Alert and oriented. Well appearing and in no distress. Eyes: Conjunctivae are normal. PERRL. Normal extraocular movements. ENT   Head:  Normocephalic and atraumatic.   Nose: No congestion/rhinnorhea.   Mouth/Throat: Mucous membranes are moist.   Neck: No stridor. Cardiovascular: Normal rate, regular rhythm. Normal and symmetric distal pulses are present in all extremities. No murmurs, rubs, or gallops. Respiratory: Normal respiratory effort without tachypnea nor retractions. Breath sounds are clear and equal bilaterally. No wheezes/rales/rhonchi. Gastrointestinal: Soft and nontender. No distention. No abdominal bruits.  Musculoskeletal: Nontender with normal range of motion in all extremities. No joint effusions.  No lower extremity tenderness nor edema. Neurologic:  Normal speech and language. No gross focal neurologic deficits are appreciated.  Skin:  Skin is warm, dry and intact. No rash noted. Psychiatric: Mood and affect are normal. Speech and behavior are normal. Patient exhibits appropriate insight and judgment. ____________________________________________  EKG: Interpreted by me. Normal sinus rhythm with a rate of 73 bpm, normal PR interval, normal QRS, borderline long QT interval. Normal axis.  ____________________________________________  ED COURSE:  Pertinent labs & imaging results that were available during my care of the patient were reviewed by me and considered in my medical decision making (see chart for details). Patient is in no acute distress, well known to the ER. Will recheck basic labs and reevaluate. ____________________________________________    LABS (pertinent positives/negatives)  Labs Reviewed  BASIC METABOLIC PANEL - Abnormal; Notable for the following:    Glucose, Bld 149 (*)    Calcium 8.7 (*)    All other components within normal limits  CBC - Abnormal; Notable for the following:    RBC 3.09 (*)    Hemoglobin 9.8 (*)    HCT 28.9 (*)    RDW 14.9 (*)    Platelets 122 (*)    All other components within normal limits  URINALYSIS COMPLETEWITH MICROSCOPIC ([facility] ONLY) -  Abnormal; Notable for the following:    Color, Urine YELLOW (*)    APPearance CLEAR (*)    Hgb urine dipstick 3+ (*)    All other components within normal limits  MAGNESIUM  TROPONIN I  CBG MONITORING, ED   ____________________________________________  FINAL ASSESSMENT AND PLAN  Near-syncope, hematuria  Plan: Patient with labs as dictated above. Patient with acute on chronic symptoms. He has been seen in the past for each of these complaints, vital signs are unremarkable. Dizziness is likely medication related, unclear etiology for his intermittent hematuria. He is stable for outpatient follow-up with his doctor.   Emily FilbertWilliams, Darah Simkin E, MD   Emily FilbertJonathan E Koal Eslinger, MD 12/02/15 [phone]

## 2015-12-05 ENCOUNTER — Encounter: Payer: Self-pay | Admitting: Medical Oncology

## 2015-12-05 ENCOUNTER — Emergency Department
Admission: EM | Admit: 2015-12-05 | Discharge: 2015-12-05 | Disposition: A | Discharge status: Home / Self Care | Payer: Medicare Other | Attending: Emergency Medicine | Admitting: Emergency Medicine

## 2015-12-05 DIAGNOSIS — Z7982 Long term (current) use of aspirin: Secondary | ICD-10-CM | POA: Diagnosis not present

## 2015-12-05 DIAGNOSIS — J069 Acute upper respiratory infection, unspecified: Secondary | ICD-10-CM | POA: Insufficient documentation

## 2015-12-05 DIAGNOSIS — Z79899 Other long term (current) drug therapy: Secondary | ICD-10-CM | POA: Insufficient documentation

## 2015-12-05 DIAGNOSIS — Z7984 Long term (current) use of oral hypoglycemic drugs: Secondary | ICD-10-CM | POA: Insufficient documentation

## 2015-12-05 DIAGNOSIS — R1011 Right upper quadrant pain: Secondary | ICD-10-CM | POA: Insufficient documentation

## 2015-12-05 DIAGNOSIS — Z792 Long term (current) use of antibiotics: Secondary | ICD-10-CM | POA: Diagnosis not present

## 2015-12-05 DIAGNOSIS — I10 Essential (primary) hypertension: Secondary | ICD-10-CM | POA: Diagnosis not present

## 2015-12-05 DIAGNOSIS — E119 Type 2 diabetes mellitus without complications: Secondary | ICD-10-CM | POA: Diagnosis not present

## 2015-12-05 DIAGNOSIS — R05 Cough: Secondary | ICD-10-CM | POA: Diagnosis present

## 2015-12-05 DIAGNOSIS — B9789 Other viral agents as the cause of diseases classified elsewhere: Secondary | ICD-10-CM

## 2015-12-05 LAB — POCT RAPID STREP A: Streptococcus, Group A Screen (Direct): NEGATIVE

## 2015-12-05 LAB — RAPID INFLUENZA A&B ANTIGENS
Influenza A (ARMC): NEGATIVE
Influenza B (ARMC): NEGATIVE

## 2015-12-05 MED ORDER — BENZONATATE 100 MG PO CAPS: 100.0000 mg | ORAL_CAPSULE | Freq: Three times a day (TID) | ORAL | Status: DC | PRN

## 2015-12-05 NOTE — ED Notes (Signed)
Per pt productive cough with yellow sputum. Diminished bs bilaterial with left base worse. A/o

## 2015-12-05 NOTE — ED Provider Notes (Signed)
[facility] Emergency Department Provider Note ____________________________________________  Time seen: 7:00pm  I have reviewed the triage vital signs and the nursing notes.  HISTORY  Chief Complaint  Sore Throat; Cough; and Chills  HPI Howard [name] is a 35 y.o. male with history of COPD who presents with sore throat and productive cough for the past 2 days. Symptoms have been getting progressively worse. He notes that his sore throat is worse with eating. He has not tried anything to make his symptoms better. Patient has associated subjective fever, chills, headache and rhinorrhea. He denies nausea, vomiting, diarrhea, earache, congestion, shortness of breath, or chest pain. Symptoms tend to be worse at night, he rates the severity of the symptoms to be a 6 out of 10. Immunizations are up to date, but he has not received the seasonal flu shot. Sick contacts include his brother.  Past Medical History  Diagnosis Date  . Hypertension   . Schizophrenia (HCC)   . Hyperlipemia   . Depression   . Diabetes (HCC)   . Liver disease   . Diabetes mellitus, type II (HCC)   . Heart disease   . Alcoholic cirrhosis of liver with ascites (HCC)   . Esophageal varices (HCC)   . Portal hypertensive gastropathy   . COPD (chronic obstructive pulmonary disease) [facility](HCC)     Patient Active Problem List   Diagnosis Date Noted  . Fever 11/11/2015  . Ascites due to alcoholic cirrhosis (HCC) 11/11/2015  . Cirrhosis of liver with ascites (HCC) 11/11/2015  . Hepatic encephalopathy (HCC) 10/16/2015  . Type 2 diabetes mellitus (HCC) 10/16/2015  . Acute renal failure (ARF) (HCC) 07/30/2015  . 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 Hypoxia 07/04/2015  . Elevated transaminase level 07/04/2015  . DOE (dyspnea on exertion) 07/04/2015  . Alcohol abuse 05/31/2015  . Hypertension 05/29/2015  . Paranoid schizophrenia (HCC) 03/20/2015    Past Surgical History   Procedure Laterality Date  . No past surgeries    . Esophagogastroduodenoscopy (egd) with propofol N/A 08/03/2015    Procedure: ESOPHAGOGASTRODUODENOSCOPY (EGD) WITH PROPOFOL;  Surgeon: Elnita MaxwellMatthew Gordon Rein, MD;  [facility];  Service: Endoscopy;  Laterality: N/A;  . Esophagogastroduodenoscopy (egd) with propofol N/A 08/31/2015    Procedure: ESOPHAGOGASTRODUODENOSCOPY (EGD) WITH PROPOFOL;  Surgeon: Elnita MaxwellMatthew Gordon Rein, MD;  [facility];  Service: Endoscopy;  Laterality: N/A;  . Esophagogastroduodenoscopy N/A 10/05/2015    Procedure: ESOPHAGOGASTRODUODENOSCOPY (EGD);  Surgeon: Elnita MaxwellMatthew Gordon Rein, MD;  [facility];  Service: Endoscopy;  Laterality: N/A;    Current Outpatient Rx  Name  Route  Sig  Dispense  Refill  . albuterol (PROVENTIL HFA;VENTOLIN HFA) 108 (90 BASE) MCG/ACT inhaler   Inhalation   Inhale 2 puffs into the lungs every 6 (six) hours as needed for wheezing or shortness of breath. Reported on 11/11/2015         . aspirin EC 81 MG tablet   Oral   Take 81 mg by mouth daily.          . benzonatate (TESSALON PERLES) 100 MG capsule   Oral   Take 1 capsule (100 mg total) by mouth 3 (three) times daily as needed for cough (Take 1-2 per dose).   30 capsule   0   . citalopram (CELEXA) 20 MG tablet   Oral   Take 1 tablet (20 mg total) by mouth daily.   90 tablet   1   . diphenhydrAMINE (BENADRYL) 25 mg capsule  TAKE 2 CAPSULES BY MOUTH AT BEDTIME   60 capsule   2   . lactulose (CHRONULAC) 10 GM/15ML solution   Oral   Take 45 mLs (30 g total) by mouth 2 (two) times daily.   240 mL   0   . levofloxacin (LEVAQUIN) 500 MG tablet   Oral   Take 1 tablet (500 mg total) by mouth daily.   14 tablet   0   . losartan-hydrochlorothiazide (HYZAAR) 100-25 MG tablet   Oral   Take 1 tablet by mouth daily.          . metFORMIN (GLUCOPHAGE) 500 MG tablet   Oral   Take 1 tablet (500 mg total) by mouth 2 (two) times daily with a  meal.   30 tablet   0   . metoCLOPramide (REGLAN) 10 MG tablet   Oral   Take 1 tablet (10 mg total) by mouth 3 (three) times daily before meals.   20 tablet   0   . metoprolol tartrate (LOPRESSOR) 25 MG tablet   Oral   Take 1 tablet (25 mg total) by mouth 2 (two) times daily.   60 tablet   0   . Multiple Vitamin (MULTIVITAMIN WITH MINERALS) TABS tablet   Oral   Take 1 tablet by mouth daily.   30 tablet   0   . OLANZapine (ZYPREXA) 15 MG tablet   Oral   Take 1 tablet (15 mg total) by mouth at bedtime.   90 tablet   1   . omeprazole (PRILOSEC) 40 MG capsule   Oral   Take 40 mg by mouth every morning.          . ondansetron (ZOFRAN) 4 MG tablet   Oral   Take 1 tablet (4 mg total) by mouth daily as needed for nausea or vomiting.   20 tablet   1   . simvastatin (ZOCOR) 40 MG tablet   Oral   Take 40 mg by mouth at bedtime.          . traMADol (ULTRAM) 50 MG tablet   Oral   Take 1 tablet (50 mg total) by mouth every 6 (six) hours as needed.   20 tablet   0     Allergies Review of patient's allergies indicates no known allergies.  Family History  Problem Relation Age of Onset  . Heart disease Mother   . Hypertension Mother   . Hyperlipidemia Mother   . Stroke Father   . Heart attack Father   . Hypertension Father   . Heart disease Father   . Alcohol abuse Father   . Heart disease Brother     Social History Social History  Substance Use Topics  . Smoking status: Never Smoker   . Smokeless tobacco: Never Used  . Alcohol Use: No     Comment: last drink 35 days ago   Review of Systems  Constitutional: Positive for subjective fever, chills. Eyes: Negative for visual changes. ENT: Positive for sore throat, rhinorrhea. Negative for sinus pain and congestion. Cardiovascular: Negative for chest pain. Respiratory: Positive for productive cough. Negative for shortness of breath. Gastrointestinal: Negative for abdominal pain, vomiting and  diarrhea. Neurological: Positive for headaches. ____________________________________________  PHYSICAL EXAM:  VITAL SIGNS: ED Triage Vitals  Enc Vitals Group     BP 12/05/15 1820 140/74 mmHg     Pulse Rate 12/05/15 1820 79     Resp 12/05/15 1820 18     Temp 12/05/15 1820  98.6 F (37 C)     Temp Source 12/05/15 1820 Oral     SpO2 12/05/15 1820 98 %     Weight 12/05/15 1820 290 lb (131.543 kg)     Height 12/05/15 1820  (1.905 m)     Head Cir --      Peak Flow --      Pain Score 12/05/15 1820 7     Pain Loc --      Pain Edu? --      Excl. in GC? --    Constitutional: Alert and oriented. Well appearing and in no distress. Patient is on oxygen.  Head: Normocephalic and atraumatic.      Eyes: Conjunctivae are normal. PERRL.       Ears: Canals have dry skin, cerumen and dry blood secondary to scratching. TMs intact bilaterally without injection.   Nose: No congestion/rhinorrhea.   Mouth/Throat: Mucous membranes are moist. Tonsils are flat. No erythema, edema, or exudates.    Neck: Supple. No thyromegaly. Hematological/Lymphatic/Immunological: No cervical lymphadenopathy. Cardiovascular: Normal rate, regular rhythm. Grade II/VI systolic murmur.  Respiratory: Normal respiratory effort on oxygen. No wheezes/rales/rhonchi. Gastrointestinal: Soft. Tenderness to palpation of RUQ. No distention. Neurologic:  Normal gait without ataxia. Normal speech and language. No gross focal neurologic deficits are appreciated. Skin:  Skin is warm, dry and intact. No rash noted. Psychiatric: Mood and affect are normal. Patient exhibits appropriate insight and judgment. ____________________________________________   LABS (pertinent positives/negatives) Labs Reviewed  RAPID INFLUENZA A&B ANTIGENS ([facility] ONLY)  POCT RAPID STREP A  ____________________________________________  INITIAL IMPRESSION / ASSESSMENT AND PLAN / ED COURSE  Patient with viral URI symptoms. Patient will take  Tessalon Perles as prescribed for cough control. He will follow up with his PCP as needed. He will return to the ED if in respiratory distress.  ____________________________________________  FINAL CLINICAL IMPRESSION(S) / ED DIAGNOSES  Final diagnoses:  Viral URI with cough     Lissa Hoard, PA-C 12/05/15 2046  Maurilio Lovely, MD 12/06/15 0030

## 2015-12-05 NOTE — Discharge Instructions (Signed)
Upper Respiratory Infection, Adult Most upper respiratory infections (URIs) are a viral infection of the air passages leading to the lungs. A URI affects the nose, throat, and upper air passages. The most common type of URI is nasopharyngitis and is typically referred to as "the common cold." URIs run their course and usually go away on their own. Most of the time, a URI does not require medical attention, but sometimes a bacterial infection in the upper airways can follow a viral infection. This is called a secondary infection. Sinus and middle ear infections are common types of secondary upper respiratory infections. Bacterial pneumonia can also complicate a URI. A URI can worsen asthma and chronic obstructive pulmonary disease (COPD). Sometimes, these complications can require emergency medical care and may be life threatening.  CAUSES Almost all URIs are caused by viruses. A virus is a type of germ and can spread from one person to another.  RISKS FACTORS You may be at risk for a URI if:   You smoke.   You have chronic heart or lung disease.  You have a weakened defense (immune) system.   You are very young or very old.   You have nasal allergies or asthma.  You work in crowded or poorly ventilated areas.  You work in health care facilities or schools. SIGNS AND SYMPTOMS  Symptoms typically develop 2-3 days after you come in contact with a cold virus. Most viral URIs last 7-10 days. However, viral URIs from the influenza virus (flu virus) can last 14-18 days and are typically more severe. Symptoms may include:   Runny or stuffy (congested) nose.   Sneezing.   Cough.   Sore throat.   Headache.   Fatigue.   Fever.   Loss of appetite.   Pain in your forehead, behind your eyes, and over your cheekbones (sinus pain).  Muscle aches.  DIAGNOSIS  Your health care provider may diagnose a URI by:  Physical exam.  Tests to check that your symptoms are not due to  another condition such as:  Strep throat.  Sinusitis.  Pneumonia.  Asthma. TREATMENT  A URI goes away on its own with time. It cannot be cured with medicines, but medicines may be prescribed or recommended to relieve symptoms. Medicines may help:  Reduce your fever.  Reduce your cough.  Relieve nasal congestion. HOME CARE INSTRUCTIONS   Take medicines only as directed by your health care provider.   Gargle warm saltwater or take cough drops to comfort your throat as directed by your health care provider.  Use a warm mist humidifier or inhale steam from a shower to increase air moisture. This may make it easier to breathe.  Drink enough fluid to keep your urine clear or pale yellow.   Eat soups and other clear broths and maintain good nutrition.   Rest as needed.   Return to work when your temperature has returned to normal or as your health care provider advises. You may need to stay home longer to avoid infecting others. You can also use a face mask and careful hand washing to prevent spread of the virus.  Increase the usage of your inhaler if you have asthma.   Do not use any tobacco products, including cigarettes, chewing tobacco, or electronic cigarettes. If you need help quitting, ask your health care provider. PREVENTION  The best way to protect yourself from getting a cold is to practice good hygiene.   Avoid oral or hand contact with people with cold  symptoms.   Wash your hands often if contact occurs.  There is no clear evidence that vitamin C, vitamin E, echinacea, or exercise reduces the chance of developing a cold. However, it is always recommended to get plenty of rest, exercise, and practice good nutrition.  SEEK MEDICAL CARE IF:   You are getting worse rather than better.   Your symptoms are not controlled by medicine.   You have chills.  You have worsening shortness of breath.  You have brown or red mucus.  You have yellow or brown nasal  discharge.  You have pain in your face, especially when you bend forward.  You have a fever.  You have swollen neck glands.  You have pain while swallowing.  You have white areas in the back of your throat. SEEK IMMEDIATE MEDICAL CARE IF:   You have severe or persistent:  Headache.  Ear pain.  Sinus pain.  Chest pain.  You have chronic lung disease and any of the following:  Wheezing.  Prolonged cough.  Coughing up blood.  A change in your usual mucus.  You have a stiff neck.  You have changes in your:  Vision.  Hearing.  Thinking.  Mood. MAKE SURE YOU:   Understand these instructions.  Will watch your condition.  Will get help right away if you are not doing well or get worse.   This information is not intended to replace advice given to you by your health care provider. Make sure you discuss any questions you have with your health care provider.   Document Released: 03/04/2001 Document Revised: 01/23/2015 Document Reviewed: 12/14/2013 Elsevier Interactive Patient Education 2016 [employer]   Take [employer] Perles as prescribed for cough control. Follow up with your PCP if symptoms worsen. Return to the ED if in respiratory distress.

## 2015-12-05 NOTE — ED Notes (Signed)
Pt reports sore throat, body aches, and chills x 2 days. Wears home o2 in NAD.

## 2015-12-06 ENCOUNTER — Encounter: Payer: Self-pay | Admitting: Emergency Medicine

## 2015-12-06 ENCOUNTER — Emergency Department: Payer: Medicare Other

## 2015-12-06 ENCOUNTER — Other Ambulatory Visit: Payer: Self-pay

## 2015-12-06 DIAGNOSIS — F209 Schizophrenia, unspecified: Secondary | ICD-10-CM | POA: Diagnosis not present

## 2015-12-06 DIAGNOSIS — Z5321 Procedure and treatment not carried out due to patient leaving prior to being seen by health care provider: Secondary | ICD-10-CM | POA: Diagnosis not present

## 2015-12-06 DIAGNOSIS — Z79899 Other long term (current) drug therapy: Secondary | ICD-10-CM | POA: Insufficient documentation

## 2015-12-06 DIAGNOSIS — R0602 Shortness of breath: Secondary | ICD-10-CM | POA: Diagnosis present

## 2015-12-06 DIAGNOSIS — J449 Chronic obstructive pulmonary disease, unspecified: Secondary | ICD-10-CM | POA: Diagnosis not present

## 2015-12-06 DIAGNOSIS — E785 Hyperlipidemia, unspecified: Secondary | ICD-10-CM | POA: Insufficient documentation

## 2015-12-06 DIAGNOSIS — K766 Portal hypertension: Secondary | ICD-10-CM | POA: Diagnosis not present

## 2015-12-06 DIAGNOSIS — Z7984 Long term (current) use of oral hypoglycemic drugs: Secondary | ICD-10-CM | POA: Diagnosis not present

## 2015-12-06 DIAGNOSIS — K7031 Alcoholic cirrhosis of liver with ascites: Secondary | ICD-10-CM | POA: Diagnosis not present

## 2015-12-06 DIAGNOSIS — E119 Type 2 diabetes mellitus without complications: Secondary | ICD-10-CM | POA: Insufficient documentation

## 2015-12-06 DIAGNOSIS — F329 Major depressive disorder, single episode, unspecified: Secondary | ICD-10-CM | POA: Diagnosis not present

## 2015-12-06 LAB — BASIC METABOLIC PANEL
ANION GAP: 7 (ref 5–15)
BUN: 19 mg/dL (ref 6–20)
CHLORIDE: 107 mmol/L (ref 101–111)
CO2: 23 mmol/L (ref 22–32)
Calcium: 8.9 mg/dL (ref 8.9–10.3)
Creatinine, Ser: 1.14 mg/dL (ref 0.61–1.24)
GFR calc non Af Amer: >60 mL/min (ref >60)
Glucose, Bld: 106 mg/dL — ABNORMAL HIGH (ref 65–99)
POTASSIUM: 3.8 mmol/L (ref 3.5–5.1)
Sodium: 137 mmol/L (ref 135–145)

## 2015-12-06 LAB — CBC
HEMATOCRIT: 29.1 % — AB (ref 40.0–52.0)
HEMOGLOBIN: 10 g/dL — AB (ref 13.0–18.0)
MCH: 31.7 pg (ref 26.0–34.0)
MCHC: 34.4 g/dL (ref 32.0–36.0)
MCV: 92.2 fL (ref 80.0–100.0)
Platelets: 108 10*3/uL — ABNORMAL LOW (ref 150–440)
RBC: 3.16 MIL/uL — AB (ref 4.40–5.90)
RDW: 14.5 % (ref 11.5–14.5)
WBC: 6 10*3/uL (ref 3.8–10.6)

## 2015-12-06 LAB — TROPONIN I: Troponin I: <0.03 ng/mL (ref <0.031)

## 2015-12-06 LAB — AMMONIA: Ammonia: 72 umol/L — ABNORMAL HIGH (ref 9–35)

## 2015-12-06 NOTE — ED Notes (Addendum)
Pt reports shortness of breath, fever, heart palpitations. Pt also reports he wears 2L of home oxygen but did not bring with him. Pt reports hx of  Cirrhosis and hasn't been taking his lactulose as prescribed while sick.

## 2015-12-06 NOTE — ED Notes (Signed)
Pt placed on 2L via [place].  

## 2015-12-07 ENCOUNTER — Emergency Department
Admission: EM | Admit: 2015-12-07 | Discharge: 2015-12-07 | Disposition: A | Discharge status: Home / Self Care | Payer: Medicare Other | Attending: Emergency Medicine | Admitting: Emergency Medicine

## 2015-12-07 ENCOUNTER — Telehealth: Payer: Self-pay | Admitting: Emergency Medicine

## 2015-12-07 NOTE — ED Notes (Signed)
Called patient due to lwot to inquire about condition and follow up plans. Left message with my number. 

## 2015-12-11 DIAGNOSIS — I10 Essential (primary) hypertension: Secondary | ICD-10-CM | POA: Insufficient documentation

## 2015-12-11 DIAGNOSIS — E7800 Pure hypercholesterolemia, unspecified: Secondary | ICD-10-CM | POA: Insufficient documentation

## 2015-12-14 ENCOUNTER — Encounter: Payer: Self-pay | Admitting: Emergency Medicine

## 2015-12-14 ENCOUNTER — Emergency Department
Admission: EM | Admit: 2015-12-14 | Discharge: 2015-12-14 | Disposition: A | Discharge status: Home / Self Care | Payer: Medicare Other | Attending: Emergency Medicine | Admitting: Emergency Medicine

## 2015-12-14 DIAGNOSIS — E119 Type 2 diabetes mellitus without complications: Secondary | ICD-10-CM | POA: Insufficient documentation

## 2015-12-14 DIAGNOSIS — Z658 Other specified problems related to psychosocial circumstances: Secondary | ICD-10-CM | POA: Insufficient documentation

## 2015-12-14 DIAGNOSIS — E7220 Disorder of urea cycle metabolism, unspecified: Secondary | ICD-10-CM | POA: Insufficient documentation

## 2015-12-14 DIAGNOSIS — F200 Paranoid schizophrenia: Secondary | ICD-10-CM | POA: Diagnosis not present

## 2015-12-14 DIAGNOSIS — K7031 Alcoholic cirrhosis of liver with ascites: Secondary | ICD-10-CM | POA: Insufficient documentation

## 2015-12-14 DIAGNOSIS — N179 Acute kidney failure, unspecified: Secondary | ICD-10-CM | POA: Diagnosis not present

## 2015-12-14 DIAGNOSIS — F10231 Alcohol dependence with withdrawal delirium: Secondary | ICD-10-CM | POA: Diagnosis present

## 2015-12-14 DIAGNOSIS — F329 Major depressive disorder, single episode, unspecified: Secondary | ICD-10-CM | POA: Insufficient documentation

## 2015-12-14 DIAGNOSIS — G9341 Metabolic encephalopathy: Secondary | ICD-10-CM | POA: Insufficient documentation

## 2015-12-14 DIAGNOSIS — E785 Hyperlipidemia, unspecified: Secondary | ICD-10-CM | POA: Diagnosis not present

## 2015-12-14 DIAGNOSIS — J449 Chronic obstructive pulmonary disease, unspecified: Secondary | ICD-10-CM | POA: Insufficient documentation

## 2015-12-14 DIAGNOSIS — Z7984 Long term (current) use of oral hypoglycemic drugs: Secondary | ICD-10-CM | POA: Insufficient documentation

## 2015-12-14 DIAGNOSIS — Z7982 Long term (current) use of aspirin: Secondary | ICD-10-CM | POA: Insufficient documentation

## 2015-12-14 DIAGNOSIS — R0609 Other forms of dyspnea: Secondary | ICD-10-CM | POA: Insufficient documentation

## 2015-12-14 DIAGNOSIS — Z79899 Other long term (current) drug therapy: Secondary | ICD-10-CM | POA: Insufficient documentation

## 2015-12-14 DIAGNOSIS — K766 Portal hypertension: Secondary | ICD-10-CM | POA: Diagnosis not present

## 2015-12-14 DIAGNOSIS — I119 Hypertensive heart disease without heart failure: Secondary | ICD-10-CM | POA: Diagnosis not present

## 2015-12-14 LAB — ETHANOL: Alcohol, Ethyl (B): <5 mg/dL (ref <5)

## 2015-12-14 LAB — COMPREHENSIVE METABOLIC PANEL
ALBUMIN: 3.7 g/dL (ref 3.5–5.0)
ALK PHOS: 93 U/L (ref 38–126)
ALT: 34 U/L (ref 17–63)
ANION GAP: 6 (ref 5–15)
AST: 55 U/L — ABNORMAL HIGH (ref 15–41)
BUN: 20 mg/dL (ref 6–20)
CALCIUM: 9.2 mg/dL (ref 8.9–10.3)
CO2: 25 mmol/L (ref 22–32)
Chloride: 107 mmol/L (ref 101–111)
Creatinine, Ser: 0.96 mg/dL (ref 0.61–1.24)
GFR calc Af Amer: >60 mL/min (ref >60)
GFR calc non Af Amer: >60 mL/min (ref >60)
GLUCOSE: 91 mg/dL (ref 65–99)
Potassium: 3.9 mmol/L (ref 3.5–5.1)
SODIUM: 138 mmol/L (ref 135–145)
Total Bilirubin: 1.3 mg/dL — ABNORMAL HIGH (ref 0.3–1.2)
Total Protein: 7.5 g/dL (ref 6.5–8.1)

## 2015-12-14 LAB — URINE DRUG SCREEN, QUALITATIVE (ARMC ONLY)
Amphetamines, Ur Screen: NOT DETECTED
Barbiturates, Ur Screen: NOT DETECTED
Benzodiazepine, Ur Scrn: NOT DETECTED
CANNABINOID 50 NG, UR ~~LOC~~: NOT DETECTED
COCAINE METABOLITE, UR ~~LOC~~: NOT DETECTED
MDMA (ECSTASY) UR SCREEN: NOT DETECTED
Methadone Scn, Ur: NOT DETECTED
Opiate, Ur Screen: NOT DETECTED
Phencyclidine (PCP) Ur S: NOT DETECTED
TRICYCLIC, UR SCREEN: NOT DETECTED

## 2015-12-14 LAB — CBC WITH DIFFERENTIAL/PLATELET
BASOS ABS: 0 10*3/uL (ref 0–0.1)
BASOS PCT: 0 %
EOS ABS: 0.2 10*3/uL (ref 0–0.7)
Eosinophils Relative: 4 %
HCT: 31.9 % — ABNORMAL LOW (ref 40.0–52.0)
HEMOGLOBIN: 10.8 g/dL — AB (ref 13.0–18.0)
LYMPHS ABS: 1.3 10*3/uL (ref 1.0–3.6)
Lymphocytes Relative: 22 %
MCH: 30.5 pg (ref 26.0–34.0)
MCHC: 33.8 g/dL (ref 32.0–36.0)
MCV: 90.4 fL (ref 80.0–100.0)
MONOS PCT: 8 %
Monocytes Absolute: 0.5 10*3/uL (ref 0.2–1.0)
NEUTROS ABS: 4 10*3/uL (ref 1.4–6.5)
NEUTROS PCT: 66 %
Platelets: 138 10*3/uL — ABNORMAL LOW (ref 150–440)
RBC: 3.53 MIL/uL — AB (ref 4.40–5.90)
RDW: 14.6 % — ABNORMAL HIGH (ref 11.5–14.5)
WBC: 6.1 10*3/uL (ref 3.8–10.6)

## 2015-12-14 LAB — AMMONIA: Ammonia: 92 umol/L — ABNORMAL HIGH (ref 9–35)

## 2015-12-14 MED ORDER — LACTULOSE 10 GM/15ML PO SOLN
30.0000 g | Freq: Once | ORAL | Status: AC
  Administered 2015-12-14: 30 g via ORAL
  Filled 2015-12-14: qty 60

## 2015-12-14 NOTE — ED Notes (Signed)
He is to be discharged to home  Mother is at bedside

## 2015-12-14 NOTE — ED Notes (Signed)
BEHAVIORAL HEALTH ROUNDING Patient sleeping: No. Patient alert and oriented: yes Behavior appropriate: Yes.  ; If no, describe:  Nutrition and fluids offered: yes Toileting and hygiene offered: Yes  Sitter present: q15 minute observations and security  monitoring Law enforcement present: Yes  ODS  

## 2015-12-14 NOTE — ED Notes (Signed)
Pt states  "I relapsed on alcohol - I just drink a drink a beer each night."

## 2015-12-14 NOTE — Discharge Instructions (Signed)
Takes 30 g instead of 15 g by mouth lactulose this afternoon and tonight, he would prefer to go home instead of being admitted to the hospital which is certainly your choice but if you have any confusion or lethargy or increased tremor or you feel worse in any way return to the emergency room. I would ask that your mother stay with you during this time to ensure that you're doing better.

## 2015-12-14 NOTE — ED Notes (Signed)

## 2015-12-14 NOTE — ED Provider Notes (Signed)
[facility] Emergency Department Provider Note  ____________________________________________   I have reviewed the triage vital signs and the nursing notes.   HISTORY  Chief Complaint Delirium Tremens (DTS)    HPI Howard [name] is a 35 y.o. male with multiple different medical problems including schizophrenia hepatic encephalopathy in the past presents today complaining of a suspicion that his ammonia is elevated. He is not confused he is at his baseline according to family. The patient does not have any significant tremor over her baseline. He doesn't a baseline slight tremor he states. In any event, he did drink "one beer" which his family verifies and he became very concerned that he was going to have DTs. He has no evidence of DTs he is not tachycardic or confused nor does he have any evidence of hyperammonemia however, the family is concerned that because he did not take his lactulose this morning his levels might be out. They're strong preference is to go home if possible they do not wish him to be admitted  Past Medical History  Diagnosis Date  . Hypertension   . Schizophrenia (HCC)   . Hyperlipemia   . Depression   . Diabetes (HCC)   . Liver disease   . Diabetes mellitus, type II (HCC)   . Heart disease   . Alcoholic cirrhosis of liver with ascites (HCC)   . Esophageal varices (HCC)   . Portal hypertensive gastropathy   . COPD (chronic obstructive pulmonary disease) [facility])     Patient Active Problem List   Diagnosis Date Noted  . Fever 11/11/2015  . Ascites due to alcoholic cirrhosis (HCC) 11/11/2015  . Cirrhosis of liver with ascites (HCC) 11/11/2015  . Hepatic encephalopathy (HCC) 10/16/2015  . Type 2 diabetes mellitus (HCC) 10/16/2015  . Acute renal failure (ARF) (HCC) 07/30/2015  . 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 Hypoxia 07/04/2015  . Elevated transaminase level 07/04/2015  . DOE (dyspnea on exertion)  07/04/2015  . Alcohol abuse 05/31/2015  . Hypertension 05/29/2015  . Paranoid schizophrenia (HCC) 03/20/2015    Past Surgical History  Procedure Laterality Date  . No past surgeries    . Esophagogastroduodenoscopy (egd) with propofol N/A 08/03/2015    Procedure: ESOPHAGOGASTRODUODENOSCOPY (EGD) WITH PROPOFOL;  Surgeon: Elnita Maxwell, MD;  Location: [facility] ENDOSCOPY;  Service: Endoscopy;  Laterality: N/A;  . Esophagogastroduodenoscopy (egd) with propofol N/A 08/31/2015    Procedure: ESOPHAGOGASTRODUODENOSCOPY (EGD) WITH PROPOFOL;  Surgeon: Elnita Maxwell, MD;  Location: [facility] ENDOSCOPY;  Service: Endoscopy;  Laterality: N/A;  . Esophagogastroduodenoscopy N/A 10/05/2015    Procedure: ESOPHAGOGASTRODUODENOSCOPY (EGD);  Surgeon: Elnita Maxwell, MD;  Location: [facility] ENDOSCOPY;  Service: Endoscopy;  Laterality: N/A;    Current Outpatient Rx  Name  Route  Sig  Dispense  Refill  . albuterol (PROVENTIL HFA;VENTOLIN HFA) 108 (90 BASE) MCG/ACT inhaler   Inhalation   Inhale 2 puffs into the lungs every 6 (six) hours as needed for wheezing or shortness of breath. Reported on 11/11/2015         . aspirin EC 81 MG tablet   Oral   Take 81 mg by mouth daily.          . benzonatate (TESSALON PERLES) 100 MG capsule   Oral   Take 1 capsule (100 mg total) by mouth 3 (three) times daily as needed for cough (Take 1-2 per dose).   30 capsule   0   . citalopram (CELEXA) 20 MG tablet  Oral   Take 1 tablet (20 mg total) by mouth daily.   90 tablet   1   . diphenhydrAMINE (BENADRYL) 25 mg capsule      TAKE 2 CAPSULES BY MOUTH AT BEDTIME   60 capsule   2   . lactulose (CHRONULAC) 10 GM/15ML solution   Oral   Take 45 mLs (30 g total) by mouth 2 (two) times daily.   240 mL   0   . losartan-hydrochlorothiazide (HYZAAR) 100-25 MG tablet   Oral   Take 1 tablet by mouth daily.          . metFORMIN (GLUCOPHAGE) 500 MG tablet   Oral   Take 1 tablet (500 mg total) by mouth 2 (two)  times daily with a meal.   30 tablet   0   . metoCLOPramide (REGLAN) 10 MG tablet   Oral   Take 1 tablet (10 mg total) by mouth 3 (three) times daily before meals.   20 tablet   0   . metoprolol tartrate (LOPRESSOR) 25 MG tablet   Oral   Take 1 tablet (25 mg total) by mouth 2 (two) times daily.   60 tablet   0   . Multiple Vitamin (MULTIVITAMIN WITH MINERALS) TABS tablet   Oral   Take 1 tablet by mouth daily.   30 tablet   0   . OLANZapine (ZYPREXA) 15 MG tablet   Oral   Take 1 tablet (15 mg total) by mouth at bedtime.   90 tablet   1   . omeprazole (PRILOSEC) 40 MG capsule   Oral   Take 40 mg by mouth every morning.          . ondansetron (ZOFRAN) 4 MG tablet   Oral   Take 1 tablet (4 mg total) by mouth daily as needed for nausea or vomiting.   20 tablet   1   . simvastatin (ZOCOR) 40 MG tablet   Oral   Take 40 mg by mouth at bedtime.          . traMADol (ULTRAM) 50 MG tablet   Oral   Take 1 tablet (50 mg total) by mouth every 6 (six) hours as needed.   20 tablet   0     Allergies Review of patient's allergies indicates no known allergies.  Family History  Problem Relation Age of Onset  . Heart disease Mother   . Hypertension Mother   . Hyperlipidemia Mother   . Stroke Father   . Heart attack Father   . Hypertension Father   . Heart disease Father   . Alcohol abuse Father   . Heart disease Brother     Social History Social History  Substance Use Topics  . Smoking status: Never Smoker   . Smokeless tobacco: Never Used  . Alcohol Use: No     Comment: last drink 35 days ago    Review of Systems Constitutional: No fever/chills Eyes: No visual changes. ENT: No sore throat. No stiff neck no neck pain Cardiovascular: Denies chest pain. Respiratory: Denies shortness of breath. Gastrointestinal:   no vomiting.  No diarrhea.  No constipation. Genitourinary: Negative for dysuria. Musculoskeletal: Negative lower extremity swelling Skin:  Negative for rash. Neurological: Negative for headaches, focal weakness or numbness. 10-point ROS otherwise negative.  ____________________________________________   PHYSICAL EXAM:  VITAL SIGNS: ED Triage Vitals  Enc Vitals Group     BP 12/14/15 1033 128/68 mmHg     Pulse Rate 12/14/15  1033 68     Resp 12/14/15 1033 20     Temp 12/14/15 1033 97.9 F (36.6 C)     Temp Source 12/14/15 1033 Oral     SpO2 12/14/15 1033 98 %     Weight 12/14/15 1033 285 lb (129.275 kg)     Height 12/14/15 1033 6\' 3"  (1.905 m)     Head Cir --      Peak Flow --      Pain Score 12/14/15 1156 0     Pain Loc --      Pain Edu? --      Excl. in GC? --     Constitutional: Alert and oriented. Well appearing and in no acute distress. Eyes: Conjunctivae are normal. PERRL. EOMI. Head: Atraumatic. Nose: No congestion/rhinnorhea. Mouth/Throat: Mucous membranes are moist.  Oropharynx non-erythematous. Neck: No stridor.   Nontender with no meningismus Cardiovascular: Normal rate, regular rhythm. Grossly normal heart sounds.  Good peripheral circulation. Respiratory: Normal respiratory effort.  No retractions. Lungs CTAB. Abdominal: Soft and nontender. No distention. No guarding no rebound Back:  There is no focal tenderness or step off there is no midline tenderness there are no lesions noted. there is no CVA tenderness Musculoskeletal: No lower extremity tenderness. No joint effusions, no DVT signs strong distal pulses no edema Neurologic:  Normal speech and language. No gross focal neurologic deficits are appreciated.  Skin:  Skin is warm, dry and intact. No rash noted. Psychiatric: Mood and affect are normal. Speech and behavior are normal.  ____________________________________________   LABS (all labs ordered are listed, but only abnormal results are displayed)  Labs Reviewed  CBC WITH DIFFERENTIAL/PLATELET - Abnormal; Notable for the following:    RBC 3.53 (*)    Hemoglobin 10.8 (*)    HCT 31.9  (*)    RDW 14.6 (*)    Platelets 138 (*)    All other components within normal limits  COMPREHENSIVE METABOLIC PANEL - Abnormal; Notable for the following:    AST 55 (*)    Total Bilirubin 1.3 (*)    All other components within normal limits  AMMONIA - Abnormal; Notable for the following:    Ammonia 92 (*)    All other components within normal limits  ETHANOL  URINE DRUG SCREEN, QUALITATIVE ([facility] ONLY)   ____________________________________________  EKG  I personally interpreted any EKGs ordered by me or triage  ____________________________________________  RADIOLOGY  I reviewed any imaging ordered by me or triage that were performed during my shift and, if possible, patient and/or family made aware of any abnormal findings. ____________________________________________   PROCEDURES  Procedure(s) performed: None  Critical Care performed: None  ____________________________________________   INITIAL IMPRESSION / ASSESSMENT AND PLAN / ED COURSE  Pertinent labs & imaging results that were available during my care of the patient were reviewed by me and considered in my medical decision making (see chart for details).  Patient in no acute distress, no evidence of confusion over his baseline, he is alert and oriented family feels that he is whom he normally is. He does have a history of schizophrenia studies a 7 this is chronic, any event, his ammonia level is somewhat elevated it usually is and is slightly higher than normal today. He does not have any evidence of coma or lethargy, I have offered and advised admission to the hospital for this patient given his elevated ammonia but patient and family would very much prefer to go home. In light of this, we'll give  him an extra dose of lactulose here, double, and I'll advise him to take 30 g sensitivity for 15 for the remainder of the day, and then go back to his regular 15, if he has any sense of confusion or any signs of hepatic  encephalopathy is to return. His mother is with him she is very conversant with the symptoms and she understands and agrees with this plan. ____________________________________________   FINAL CLINICAL IMPRESSION(S) / ED DIAGNOSES  Final diagnoses:  None      This chart was dictated using voice recognition software.  Despite best efforts to proofread,  errors can occur which can change meaning.     Jeanmarie PlantJames A Blia Totman, MD 12/14/15 1435

## 2015-12-14 NOTE — ED Notes (Signed)
States "last night i relapsed and drank a bunch of alcohol"  States he has tremors and confusion and states he gets this way when his amonia level is high.

## 2016-01-15 ENCOUNTER — Ambulatory Visit (INDEPENDENT_AMBULATORY_CARE_PROVIDER_SITE_OTHER): Payer: Medicare Other | Admitting: Psychiatry

## 2016-01-15 ENCOUNTER — Encounter: Payer: Self-pay | Admitting: Psychiatry

## 2016-01-15 VITALS — BP 138/82 | HR 81 | Temp 98.7°F | Ht 75.0 in | Wt 307.4 lb

## 2016-01-15 DIAGNOSIS — E119 Type 2 diabetes mellitus without complications: Secondary | ICD-10-CM | POA: Insufficient documentation

## 2016-01-15 DIAGNOSIS — F200 Paranoid schizophrenia: Secondary | ICD-10-CM | POA: Diagnosis not present

## 2016-01-15 DIAGNOSIS — F1010 Alcohol abuse, uncomplicated: Secondary | ICD-10-CM

## 2016-01-15 MED ORDER — OLANZAPINE 15 MG PO TABS: 15.0000 mg | ORAL_TABLET | Freq: Every day | ORAL | Status: DC

## 2016-01-15 MED ORDER — CITALOPRAM HYDROBROMIDE 20 MG PO TABS: 20.0000 mg | ORAL_TABLET | Freq: Every day | ORAL | Status: DC

## 2016-02-10 ENCOUNTER — Inpatient Hospital Stay
Admission: EM | Admit: 2016-02-10 | Discharge: 2016-02-12 | DRG: 433 | Disposition: A | Discharge status: Home / Self Care | Payer: Medicare Other | Attending: Internal Medicine | Admitting: Internal Medicine

## 2016-02-10 ENCOUNTER — Encounter: Payer: Self-pay | Admitting: Emergency Medicine

## 2016-02-10 DIAGNOSIS — R188 Other ascites: Secondary | ICD-10-CM

## 2016-02-10 DIAGNOSIS — E7800 Pure hypercholesterolemia, unspecified: Secondary | ICD-10-CM | POA: Diagnosis present

## 2016-02-10 DIAGNOSIS — F1020 Alcohol dependence, uncomplicated: Secondary | ICD-10-CM | POA: Diagnosis present

## 2016-02-10 DIAGNOSIS — K766 Portal hypertension: Secondary | ICD-10-CM | POA: Diagnosis present

## 2016-02-10 DIAGNOSIS — K7460 Unspecified cirrhosis of liver: Secondary | ICD-10-CM

## 2016-02-10 DIAGNOSIS — D696 Thrombocytopenia, unspecified: Secondary | ICD-10-CM | POA: Diagnosis present

## 2016-02-10 DIAGNOSIS — Z7982 Long term (current) use of aspirin: Secondary | ICD-10-CM | POA: Diagnosis not present

## 2016-02-10 DIAGNOSIS — R278 Other lack of coordination: Secondary | ICD-10-CM | POA: Diagnosis present

## 2016-02-10 DIAGNOSIS — K7290 Hepatic failure, unspecified without coma: Secondary | ICD-10-CM | POA: Diagnosis present

## 2016-02-10 DIAGNOSIS — Z7984 Long term (current) use of oral hypoglycemic drugs: Secondary | ICD-10-CM | POA: Diagnosis not present

## 2016-02-10 DIAGNOSIS — Z794 Long term (current) use of insulin: Secondary | ICD-10-CM | POA: Diagnosis not present

## 2016-02-10 DIAGNOSIS — Y900 Blood alcohol level of less than 20 mg/100 ml: Secondary | ICD-10-CM | POA: Diagnosis present

## 2016-02-10 DIAGNOSIS — E785 Hyperlipidemia, unspecified: Secondary | ICD-10-CM | POA: Diagnosis present

## 2016-02-10 DIAGNOSIS — K3189 Other diseases of stomach and duodenum: Secondary | ICD-10-CM | POA: Diagnosis present

## 2016-02-10 DIAGNOSIS — K7030 Alcoholic cirrhosis of liver without ascites: Secondary | ICD-10-CM | POA: Diagnosis present

## 2016-02-10 DIAGNOSIS — E119 Type 2 diabetes mellitus without complications: Secondary | ICD-10-CM | POA: Diagnosis present

## 2016-02-10 DIAGNOSIS — J449 Chronic obstructive pulmonary disease, unspecified: Secondary | ICD-10-CM | POA: Diagnosis present

## 2016-02-10 DIAGNOSIS — I10 Essential (primary) hypertension: Secondary | ICD-10-CM | POA: Diagnosis present

## 2016-02-10 DIAGNOSIS — K7682 Hepatic encephalopathy: Secondary | ICD-10-CM

## 2016-02-10 DIAGNOSIS — Z79899 Other long term (current) drug therapy: Secondary | ICD-10-CM

## 2016-02-10 DIAGNOSIS — F209 Schizophrenia, unspecified: Secondary | ICD-10-CM | POA: Diagnosis present

## 2016-02-10 DIAGNOSIS — F329 Major depressive disorder, single episode, unspecified: Secondary | ICD-10-CM | POA: Diagnosis present

## 2016-02-10 LAB — COMPREHENSIVE METABOLIC PANEL
ALBUMIN: 3.7 g/dL (ref 3.5–5.0)
ALK PHOS: 99 U/L (ref 38–126)
ALT: 28 U/L (ref 17–63)
AST: 40 U/L (ref 15–41)
Anion gap: 6 (ref 5–15)
BUN: 9 mg/dL (ref 6–20)
CALCIUM: 8.6 mg/dL — AB (ref 8.9–10.3)
CO2: 25 mmol/L (ref 22–32)
CREATININE: 0.68 mg/dL (ref 0.61–1.24)
Chloride: 110 mmol/L (ref 101–111)
GFR calc Af Amer: >60 mL/min (ref >60)
GFR calc non Af Amer: >60 mL/min (ref >60)
GLUCOSE: 156 mg/dL — AB (ref 65–99)
Potassium: 3.9 mmol/L (ref 3.5–5.1)
SODIUM: 141 mmol/L (ref 135–145)
Total Bilirubin: 1 mg/dL (ref 0.3–1.2)
Total Protein: 7.3 g/dL (ref 6.5–8.1)

## 2016-02-10 LAB — CBC WITH DIFFERENTIAL/PLATELET
BASOS PCT: 1 %
Basophils Absolute: 0 10*3/uL (ref 0–0.1)
EOS ABS: 0.2 10*3/uL (ref 0–0.7)
Eosinophils Relative: 3 %
HCT: 30.1 % — ABNORMAL LOW (ref 40.0–52.0)
HEMOGLOBIN: 9.9 g/dL — AB (ref 13.0–18.0)
Lymphocytes Relative: 22 %
Lymphs Abs: 1.1 10*3/uL (ref 1.0–3.6)
MCH: 29.1 pg (ref 26.0–34.0)
MCHC: 33 g/dL (ref 32.0–36.0)
MCV: 88.2 fL (ref 80.0–100.0)
Monocytes Absolute: 0.5 10*3/uL (ref 0.2–1.0)
Monocytes Relative: 9 %
NEUTROS PCT: 65 %
Neutro Abs: 3.3 10*3/uL (ref 1.4–6.5)
Platelets: 136 10*3/uL — ABNORMAL LOW (ref 150–440)
RBC: 3.41 MIL/uL — AB (ref 4.40–5.90)
RDW: 16 % — ABNORMAL HIGH (ref 11.5–14.5)
WBC: 5.1 10*3/uL (ref 3.8–10.6)

## 2016-02-10 LAB — GLUCOSE, CAPILLARY
Glucose-Capillary: 77 mg/dL (ref 65–99)
Glucose-Capillary: 102 mg/dL — ABNORMAL HIGH (ref 65–99)

## 2016-02-10 LAB — ETHANOL: Alcohol, Ethyl (B): 10 mg/dL — ABNORMAL HIGH (ref <5)

## 2016-02-10 LAB — AMMONIA: Ammonia: 155 umol/L — ABNORMAL HIGH (ref 9–35)

## 2016-02-10 MED ORDER — METFORMIN HCL 500 MG PO TABS
500.0000 mg | ORAL_TABLET | Freq: Two times a day (BID) | ORAL | Status: DC
  Administered 2016-02-10 – 2016-02-12 (×4): 500 mg via ORAL
  Filled 2016-02-10 (×4): qty 1

## 2016-02-10 MED ORDER — OLANZAPINE 7.5 MG PO TABS
15.0000 mg | ORAL_TABLET | Freq: Every day | ORAL | Status: DC
  Administered 2016-02-10 – 2016-02-11 (×2): 15 mg via ORAL
  Filled 2016-02-10 (×3): qty 2

## 2016-02-10 MED ORDER — ADULT MULTIVITAMIN W/MINERALS CH
1.0000 | ORAL_TABLET | Freq: Every day | ORAL | Status: DC
  Administered 2016-02-11 – 2016-02-12 (×2): 1 via ORAL
  Filled 2016-02-10 (×2): qty 1

## 2016-02-10 MED ORDER — ENOXAPARIN SODIUM 40 MG/0.4ML ~~LOC~~ SOLN
40.0000 mg | SUBCUTANEOUS | Status: DC
  Administered 2016-02-10 – 2016-02-11 (×2): 40 mg via SUBCUTANEOUS
  Filled 2016-02-10 (×2): qty 0.4

## 2016-02-10 MED ORDER — ASPIRIN EC 81 MG PO TBEC
81.0000 mg | DELAYED_RELEASE_TABLET | Freq: Every day | ORAL | Status: DC
  Administered 2016-02-10 – 2016-02-12 (×3): 81 mg via ORAL
  Filled 2016-02-10 (×3): qty 1

## 2016-02-10 MED ORDER — HYDROCHLOROTHIAZIDE 25 MG PO TABS
25.0000 mg | ORAL_TABLET | Freq: Every day | ORAL | Status: DC
  Administered 2016-02-11 – 2016-02-12 (×2): 25 mg via ORAL
  Filled 2016-02-10 (×2): qty 1

## 2016-02-10 MED ORDER — LACTULOSE 10 GM/15ML PO SOLN
30.0000 g | Freq: Three times a day (TID) | ORAL | Status: DC
  Administered 2016-02-10 – 2016-02-12 (×5): 30 g via ORAL
  Filled 2016-02-10 (×5): qty 60

## 2016-02-10 MED ORDER — METOCLOPRAMIDE HCL 10 MG PO TABS
10.0000 mg | ORAL_TABLET | Freq: Three times a day (TID) | ORAL | Status: DC
  Administered 2016-02-11 – 2016-02-12 (×5): 10 mg via ORAL
  Filled 2016-02-10 (×5): qty 1

## 2016-02-10 MED ORDER — SODIUM CHLORIDE 0.9% FLUSH: 3.0000 mL | INTRAVENOUS | Status: DC | PRN

## 2016-02-10 MED ORDER — TRAMADOL HCL 50 MG PO TABS
50.0000 mg | ORAL_TABLET | Freq: Four times a day (QID) | ORAL | Status: DC | PRN
Start: 2016-02-10 — End: 2016-02-12

## 2016-02-10 MED ORDER — CITALOPRAM HYDROBROMIDE 20 MG PO TABS
20.0000 mg | ORAL_TABLET | Freq: Every day | ORAL | Status: DC
  Administered 2016-02-11 – 2016-02-12 (×2): 20 mg via ORAL
  Filled 2016-02-10 (×5): qty 1

## 2016-02-10 MED ORDER — LACTULOSE 10 GM/15ML PO SOLN
30.0000 g | Freq: Once | ORAL | Status: AC
  Administered 2016-02-10: 30 g via ORAL
  Filled 2016-02-10: qty 45

## 2016-02-10 MED ORDER — ALBUTEROL SULFATE (2.5 MG/3ML) 0.083% IN NEBU: 2.5000 mg | INHALATION_SOLUTION | Freq: Four times a day (QID) | RESPIRATORY_TRACT | Status: DC | PRN

## 2016-02-10 MED ORDER — LEVOFLOXACIN 500 MG PO TABS
500.0000 mg | ORAL_TABLET | Freq: Every day | ORAL | Status: DC
  Administered 2016-02-10 – 2016-02-11 (×2): 500 mg via ORAL
  Filled 2016-02-10 (×2): qty 1

## 2016-02-10 MED ORDER — PANTOPRAZOLE SODIUM 40 MG PO TBEC
40.0000 mg | DELAYED_RELEASE_TABLET | Freq: Every day | ORAL | Status: DC
  Administered 2016-02-10 – 2016-02-12 (×3): 40 mg via ORAL
  Filled 2016-02-10 (×3): qty 1

## 2016-02-10 MED ORDER — SIMVASTATIN 40 MG PO TABS
40.0000 mg | ORAL_TABLET | Freq: Every day | ORAL | Status: DC
  Administered 2016-02-10: 40 mg via ORAL
  Filled 2016-02-10: qty 1

## 2016-02-10 MED ORDER — LOSARTAN POTASSIUM-HCTZ 100-25 MG PO TABS: 1.0000 | ORAL_TABLET | Freq: Every day | ORAL | Status: DC

## 2016-02-10 MED ORDER — SODIUM CHLORIDE 0.9 % IV SOLN: 250.0000 mL | INTRAVENOUS | Status: DC | PRN

## 2016-02-10 MED ORDER — SODIUM CHLORIDE 0.9% FLUSH
3.0000 mL | Freq: Two times a day (BID) | INTRAVENOUS | Status: DC
  Administered 2016-02-10 – 2016-02-12 (×4): 3 mL via INTRAVENOUS

## 2016-02-10 MED ORDER — DIPHENHYDRAMINE HCL 25 MG PO CAPS
50.0000 mg | ORAL_CAPSULE | Freq: Every day | ORAL | Status: DC
  Administered 2016-02-10 – 2016-02-11 (×2): 50 mg via ORAL
  Filled 2016-02-10 (×2): qty 2

## 2016-02-10 MED ORDER — FUROSEMIDE 20 MG PO TABS
20.0000 mg | ORAL_TABLET | Freq: Two times a day (BID) | ORAL | Status: DC
  Administered 2016-02-10 – 2016-02-12 (×4): 20 mg via ORAL
  Filled 2016-02-10 (×4): qty 1

## 2016-02-10 MED ORDER — DOCUSATE SODIUM 100 MG PO CAPS
100.0000 mg | ORAL_CAPSULE | Freq: Two times a day (BID) | ORAL | Status: DC
Start: 2016-02-10 — End: 2016-02-12
  Administered 2016-02-10 – 2016-02-12 (×4): 100 mg via ORAL
  Filled 2016-02-10 (×4): qty 1

## 2016-02-10 MED ORDER — LOSARTAN POTASSIUM 50 MG PO TABS
100.0000 mg | ORAL_TABLET | Freq: Every day | ORAL | Status: DC
  Administered 2016-02-11 – 2016-02-12 (×2): 100 mg via ORAL
  Filled 2016-02-10 (×2): qty 2

## 2016-02-10 MED ORDER — ONDANSETRON HCL 4 MG PO TABS: 4.0000 mg | ORAL_TABLET | Freq: Every day | ORAL | Status: DC | PRN

## 2016-02-10 MED ORDER — METOPROLOL TARTRATE 25 MG PO TABS
25.0000 mg | ORAL_TABLET | Freq: Two times a day (BID) | ORAL | Status: DC
  Administered 2016-02-10: 25 mg via ORAL
  Filled 2016-02-10: qty 1

## 2016-02-10 MED ORDER — INSULIN ASPART 100 UNIT/ML ~~LOC~~ SOLN: 0.0000 [IU] | Freq: Every day | SUBCUTANEOUS | Status: DC

## 2016-02-10 MED ORDER — INSULIN ASPART 100 UNIT/ML ~~LOC~~ SOLN: 0.0000 [IU] | Freq: Three times a day (TID) | SUBCUTANEOUS | Status: DC

## 2016-02-10 NOTE — ED Notes (Signed)
MD at bedside. [facility] 

## 2016-02-10 NOTE — ED Notes (Signed)
Patient states "I have cirrhosis of the liver and thought it would be all right to have a few beers last night."  Patient is concerned that ammonia level is elevated.  Patient is AAOx3.  Skin warm and dry.  NAD.

## 2016-02-10 NOTE — Progress Notes (Deleted)
Tracheal suctioning aseptically and oral suctioning done as needed. Thick white secretions in small amount noted. Pt tolerated fairly well. Will continue to monitor.

## 2016-02-10 NOTE — H&P (Signed)
[facility] - [place] at [facility]   PATIENT NAME: Howard [name]    MR#:  [id number]  DATE OF BIRTH:  [birth year]  DATE OF ADMISSION:  02/10/2016  PRIMARY CARE PHYSICIAN: Marisue Ivan, MD   REQUESTING/REFERRING PHYSICIAN: Derrill Kay   CHIEF COMPLAINT:  shakes  HISTORY OF PRESENT ILLNESS:  Howard [name]  is a 35 y.o. male with a known history of Alcohol liver cirrhosis, hyperlipidemia, diabetes mellitus, portal hypertensive gastropathy and multiple other medical problems is brought into the ED for on controlled shakes and also seemed to be more confused than his usual. His last intake of alcohol was yesterday and drank 3-4 beers yesterday. According to the mother he has been constipated for the past 2 days. Reporting some right-sided abdominal pain. Denies any nausea vomiting.   PAST MEDICAL HISTORY:   Past Medical History  Diagnosis Date  . Hypertension   . Schizophrenia (HCC)   . Hyperlipemia   . Depression   . Diabetes (HCC)   . Liver disease   . Diabetes mellitus, type II (HCC)   . Heart disease   . Alcoholic cirrhosis of liver with ascites (HCC)   . Esophageal varices (HCC)   . Portal hypertensive gastropathy   . COPD (chronic obstructive pulmonary disease) (HCC)     PAST SURGICAL HISTOIRY:   Past Surgical History  Procedure Laterality Date  . No past surgeries    . Esophagogastroduodenoscopy (egd) with propofol N/A 08/03/2015    Procedure: ESOPHAGOGASTRODUODENOSCOPY (EGD) WITH PROPOFOL;  Surgeon: Elnita Maxwell, MD;  Location: [facility] ENDOSCOPY;  Service: Endoscopy;  Laterality: N/A;  . Esophagogastroduodenoscopy (egd) with propofol N/A 08/31/2015    Procedure: ESOPHAGOGASTRODUODENOSCOPY (EGD) WITH PROPOFOL;  Surgeon: Elnita Maxwell, MD;  Location: [facility] ENDOSCOPY;  Service: Endoscopy;  Laterality: N/A;  . Esophagogastroduodenoscopy N/A 10/05/2015    Procedure: ESOPHAGOGASTRODUODENOSCOPY (EGD);  Surgeon: Elnita Maxwell, MD;   Location: [facility] ENDOSCOPY;  Service: Endoscopy;  Laterality: N/A;    SOCIAL HISTORY:   Social History  Substance Use Topics  . Smoking status: Never Smoker   . Smokeless tobacco: Never Used  . Alcohol Use: 0.0 oz/week    0 Shots of liquor, 0 Standard drinks or equivalent per week     Comment: last drink 02/09/16    FAMILY HISTORY:   Family History  Problem Relation Age of Onset  . Heart disease Mother   . Hypertension Mother   . Hyperlipidemia Mother   . Stroke Father   . Heart attack Father   . Hypertension Father   . Heart disease Father   . Alcohol abuse Father   . Heart disease Brother     DRUG ALLERGIES:  No Known Allergies  REVIEW OF SYSTEMS:  Review of systems Limited as the patient is altered than his baseline , pleasantly and intermittently confused  CONSTITUTIONAL: No fever, fatigue or weakness.  RESPIRATORY: No cough, shortness of breath, wheezing or hemoptysis.  CARDIOVASCULAR: No chest pain, orthopnea, edema.  GASTROINTESTINAL: No nausea, vomiting, diarrhea or reporting constipation and right-sided abdominal pain.  NEUROLOGIC: Reporting tremors No tingling, numbness, weakness.    MEDICATIONS AT HOME:   Prior to Admission medications   Medication Sig Start Date End Date Taking? Authorizing Provider  albuterol (PROVENTIL HFA;VENTOLIN HFA) 108 (90 BASE) MCG/ACT inhaler Inhale 2 puffs into the lungs every 6 (six) hours as needed for wheezing or shortness of breath. Reported on 11/11/2015   Yes Historical Provider, MD  aspirin EC 81 MG tablet Take 81 mg by  mouth daily.    Yes Historical Provider, MD  citalopram (CELEXA) 20 MG tablet Take 1 tablet (20 mg total) by mouth daily. 01/15/16  Yes Audery Amel, MD  diphenhydrAMINE (BENADRYL) 25 mg capsule TAKE 2 CAPSULES BY MOUTH AT BEDTIME 10/03/15  Yes Audery Amel, MD  furosemide (LASIX) 20 MG tablet Take 20 mg by mouth 2 (two) times daily. 11/01/15  Yes Historical Provider, MD  lactulose (CHRONULAC) 10 GM/15ML  solution Take 45 mLs (30 g total) by mouth 2 (two) times daily. 11/21/15  Yes Governor Rooks, MD  levofloxacin (LEVAQUIN) 500 MG tablet TAKE 1 TABLET (500 MG TOTAL) BY MOUTH DAILY. 11/13/15  Yes Historical Provider, MD  losartan-hydrochlorothiazide (HYZAAR) 100-25 MG tablet Take 1 tablet by mouth daily.  08/06/15  Yes Historical Provider, MD  metFORMIN (GLUCOPHAGE) 500 MG tablet Take 1 tablet (500 mg total) by mouth 2 (two) times daily with a meal. 11/13/15  Yes Katha Hamming, MD  metoCLOPramide (REGLAN) 10 MG tablet Take 1 tablet (10 mg total) by mouth 3 (three) times daily before meals. 11/05/15 11/04/16 Yes Rebecka Apley, MD  metoprolol tartrate (LOPRESSOR) 25 MG tablet Take 1 tablet (25 mg total) by mouth 2 (two) times daily. 11/13/15  Yes Katha Hamming, MD  Multiple Vitamin (MULTIVITAMIN WITH MINERALS) TABS tablet Take 1 tablet by mouth daily. 08/04/15  Yes Ramonita Lab, MD  OLANZapine (ZYPREXA) 15 MG tablet Take 1 tablet (15 mg total) by mouth at bedtime. 01/15/16  Yes Audery Amel, MD  omeprazole (PRILOSEC) 40 MG capsule Take 40 mg by mouth every morning.    Yes Historical Provider, MD  ondansetron (ZOFRAN) 4 MG tablet Take 1 tablet (4 mg total) by mouth daily as needed for nausea or vomiting. 11/20/15  Yes Jene Every, MD  simvastatin (ZOCOR) 40 MG tablet Take 40 mg by mouth at bedtime.    Yes Historical Provider, MD  traMADol (ULTRAM) 50 MG tablet Take 1 tablet (50 mg total) by mouth every 6 (six) hours as needed. 11/18/15 11/17/16 Yes Emily Filbert, MD      VITAL SIGNS:  Blood pressure 158/88, pulse 71, temperature 98.7 F (37.1 C), temperature source Oral, resp. rate 18, height  (1.905 m), weight 137.44 kg (303 lb), SpO2 95 %.  PHYSICAL EXAMINATION:  GENERAL:  35 y.o.-year-old patient lying in the bed with no acute distress.  EYES: Pupils equal, round, reactive to light and accommodation. No scleral icterus. Extraocular muscles intact.  HEENT: Head atraumatic,  normocephalic. Oropharynx and nasopharynx clear.  NECK:  Supple, no jugular venous distention. No thyroid enlargement, no tenderness.  LUNGS: Normal breath sounds bilaterally, no wheezing, rales,rhonchi or crepitation. No use of accessory muscles of respiration.  CARDIOVASCULAR: S1, S2 normal. No murmurs, rubs, or gallops.  ABDOMEN: Soft, Right-sided abdominal tenderness but no rebound tenderness, distended. Bowel sounds present.  EXTREMITIES: No pedal edema, cyanosis, or clubbing.  NEUROLOGIC: Pleasantly confused intermittently, answers few questions appropriately  PSYCHIATRIC: The patient is alert and pleasantly confused SKIN: No obvious rash, lesion, or ulcer.   LABORATORY PANEL:   CBC  Recent Labs Lab 02/10/16 1329  WBC 5.1  HGB 9.9*  HCT 30.1*  PLT 136*   ------------------------------------------------------------------------------------------------------------------  Chemistries   Recent Labs Lab 02/10/16 1329  NA 141  K 3.9  CL 110  CO2 25  GLUCOSE 156*  BUN 9  CREATININE 0.68  CALCIUM 8.6*  AST 40  ALT 28  ALKPHOS 99  BILITOT 1.0   ------------------------------------------------------------------------------------------------------------------  Cardiac Enzymes  No results for input(s): TROPONINI in the last 168 hours. ------------------------------------------------------------------------------------------------------------------  RADIOLOGY:  No results found.  EKG:   Orders placed or performed during the hospital encounter of 12/07/15  . ED EKG within 10 minutes  . ED EKG within 10 minutes  . EKG    IMPRESSION AND PLAN:   [name]  is a 35 y.o. male with a known history of Alcohol liver cirrhosis, hyperlipidemia, diabetes mellitus, portal hypertensive gastropathy and multiple other medical problems is brought into the ED for on controlled shakes and also seemed to be more confused than his usual. His last intake of alcohol was yesterday and  drank 3-4 beers yesterday. According to the mother he has been constipated for the past 2 days. Reporting some right-sided abdominal pain.  #Hepatic encephalopathy and ammonia level is at 155 Admit patient to MedSurg floor Increase the frequency of lactulose and adjust the dose until patient has good BM Check ammonia level in a.m. Right upper quadrant ultrasound as the patient is reporting right abdominal discomfort, will order paracentesis if needed  #History of alcohol at liver cirrhosis still continues to drink alcohol CIWA protocol patient will be benefited with outpatient alcohol rehabilitation  #History of diabetes mellitus   continue metformin and sliding scale insulin     #essential hypertension.  Continue his home medication losartan hydrochlorothiazide  and titrate as needed     All the records are reviewed and case discussed with ED provider. Management plans discussed with the patient, family and they are in agreement.  CODE STATUS: fc/ mom   TOTAL TIME TAKING CARE OF THIS PATIENT: 45  minutes.    Ramonita LabGouru, Pepper Kerrick M.D on 02/10/2016 at 4:53 PM  Between 7am to 6pm - Pager - [phone]  After 6pm go to [email or web address] - password [email or web address]  [facility]  Office  [phone]  CC: Primary care physician; Marisue IvanLINTHAVONG, [name], MD

## 2016-02-10 NOTE — ED Provider Notes (Signed)
[facility] Emergency Department Provider Note    ____________________________________________  Time seen: ~1545  I have reviewed the triage vital signs and the nursing notes.   HISTORY  Chief Complaint Shaking   History limited by: Not Limited   HPI Howard [name] ScrapeMurray is a 35 y.o. male with history of cirrhosis who presents to the emergency department today because of concerns for elevated ammonia levels. Patient states that he feels a little more confused than normal. He did drink 4 beers yesterday. He also feels like his shaking has gotten worse. Furthermore he has had some pain around his right upper quadrant. Mother states that she is worried he might have some fluid around his liver. The patient denies any fevers. He does state that he is taking his lactulose. He states he normally has between 4-6 bowel movements however for the past couple of days has only had 2 per day.   Past Medical History  Diagnosis Date  . Hypertension   . Schizophrenia (HCC)   . Hyperlipemia   . Depression   . Diabetes (HCC)   . Liver disease   . Diabetes mellitus, type II (HCC)   . Heart disease   . Alcoholic cirrhosis of liver with ascites (HCC)   . Esophageal varices (HCC)   . Portal hypertensive gastropathy   . COPD (chronic obstructive pulmonary disease) [facility](HCC)     Patient Active Problem List   Diagnosis Date Noted  . Controlled type 2 diabetes mellitus without complication (HCC) 01/15/2016  . Normocytic anemia 12/12/2015  . Essential (primary) hypertension 12/11/2015  . Pure hypercholesterolemia 12/11/2015  . Fever 11/11/2015  . Ascites due to alcoholic cirrhosis (HCC) 11/11/2015  . Cirrhosis of liver with ascites (HCC) 11/11/2015  . Hepatic encephalopathy (HCC) 10/16/2015  . Type 2 diabetes mellitus (HCC) 10/16/2015  . Acute renal failure (ARF) (HCC) 07/30/2015  . 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 Alcoholic cirrhosis (HCC)  07/19/2015  . Hypoxia 07/04/2015  . Elevated transaminase level 07/04/2015  . DOE (dyspnea on exertion) 07/04/2015  . Alcohol abuse 05/31/2015  . Hypertension 05/29/2015  . Paranoid schizophrenia (HCC) 03/20/2015    Past Surgical History  Procedure Laterality Date  . No past surgeries    . Esophagogastroduodenoscopy (egd) with propofol N/A 08/03/2015    Procedure: ESOPHAGOGASTRODUODENOSCOPY (EGD) WITH PROPOFOL;  Surgeon: Elnita MaxwellMatthew Gordon Rein, MD;  [facility]COPY;  Service: Endoscopy;  Laterality: N/A;  . Esophagogastroduodenoscopy (egd) with propofol N/A 08/31/2015    Procedure: ESOPHAGOGASTRODUODENOSCOPY (EGD) WITH PROPOFOL;  Surgeon: Elnita MaxwellMatthew Gordon Rein, MD;  [facility] END[facility]COPY;  Service: Endoscopy;  Laterality: N/A;  . Esophagogastroduodenoscopy N/A 10/05/2015    Procedure: ESOPHAGOGASTRODUODENOSCOPY (EGD);  Surgeon: Elnita MaxwellMatthew Gordon Rein, MD;  [facility];  Service: Endoscopy;  Laterality: N/A;    Current Outpatient Rx  Name  Route  Sig  Dispense  Refill  . albuterol (PROVENTIL HFA;VENTOLIN HFA) 108 (90 BASE) MCG/ACT inhaler   Inhalation   Inhale 2 puffs into the lungs every 6 (six) hours as needed for wheezing or shortness of breath. Reported on 11/11/2015         . aspirin EC 81 MG tablet   Oral   Take 81 mg by mouth daily.          . citalopram (CELEXA) 20 MG tablet   Oral   Take 1 tablet (20 mg total) by mouth daily.   90 tablet   1   . diphenhydrAMINE (BENADRYL) 25 mg capsule  TAKE 2 CAPSULES BY MOUTH AT BEDTIME   60 capsule   2   . furosemide (LASIX) 20 MG tablet   Oral   Take 20 mg by mouth 2 (two) times daily.      1   . lactulose (CHRONULAC) 10 GM/15ML solution   Oral   Take 45 mLs (30 g total) by mouth 2 (two) times daily.   240 mL   0   . levofloxacin (LEVAQUIN) 500 MG tablet      TAKE 1 TABLET (500 MG TOTAL) BY MOUTH DAILY.      0   . losartan-hydrochlorothiazide (HYZAAR) 100-25 MG tablet   Oral   Take 1  tablet by mouth daily.          . metFORMIN (GLUCOPHAGE) 500 MG tablet   Oral   Take 1 tablet (500 mg total) by mouth 2 (two) times daily with a meal.   30 tablet   0   . metoCLOPramide (REGLAN) 10 MG tablet   Oral   Take 1 tablet (10 mg total) by mouth 3 (three) times daily before meals.   20 tablet   0   . metoprolol tartrate (LOPRESSOR) 25 MG tablet   Oral   Take 1 tablet (25 mg total) by mouth 2 (two) times daily.   60 tablet   0   . Multiple Vitamin (MULTIVITAMIN WITH MINERALS) TABS tablet   Oral   Take 1 tablet by mouth daily.   30 tablet   0   . OLANZapine (ZYPREXA) 15 MG tablet   Oral   Take 1 tablet (15 mg total) by mouth at bedtime.   90 tablet   1   . omeprazole (PRILOSEC) 40 MG capsule   Oral   Take 40 mg by mouth every morning.          . ondansetron (ZOFRAN) 4 MG tablet   Oral   Take 1 tablet (4 mg total) by mouth daily as needed for nausea or vomiting.   20 tablet   1   . simvastatin (ZOCOR) 40 MG tablet   Oral   Take 40 mg by mouth at bedtime.          . traMADol (ULTRAM) 50 MG tablet   Oral   Take 1 tablet (50 mg total) by mouth every 6 (six) hours as needed.   20 tablet   0     Allergies Review of patient's allergies indicates no known allergies.  Family History  Problem Relation Age of Onset  . Heart disease Mother   . Hypertension Mother   . Hyperlipidemia Mother   . Stroke Father   . Heart attack Father   . Hypertension Father   . Heart disease Father   . Alcohol abuse Father   . Heart disease Brother     Social History Social History  Substance Use Topics  . Smoking status: Never Smoker   . Smokeless tobacco: Never Used  . Alcohol Use: 0.0 oz/week    0 Shots of liquor, 0 Standard drinks or equivalent per week     Comment: last drink 02/09/16    Review of Systems  Constitutional: Negative for fever. Cardiovascular: Negative for chest pain. Respiratory: Negative for shortness of breath. Gastrointestinal:  Positive for right upper quadrant pain Genitourinary: Negative for dysuria. Musculoskeletal: Negative for back pain. Skin: Negative for rash. Neurological: Positive for confusion  10-point ROS otherwise negative.  ____________________________________________   PHYSICAL EXAM:  VITAL SIGNS: ED Triage Vitals  Enc Vitals Group     BP 02/10/16 1324 161/91 mmHg     Pulse Rate 02/10/16 1324 94     Resp 02/10/16 1324 16     Temp 02/10/16 1324 98.7 F (37.1 C)     Temp Source 02/10/16 1324 Oral     SpO2 02/10/16 1324 93 %     Weight 02/10/16 1324 303 lb (137.44 kg)     Height 02/10/16 1324 6\' 3"  (1.905 m)     Head Cir --      Peak Flow --      Pain Score 02/10/16 1326 0   Constitutional: Alert and oriented. Tremors. Eyes: Conjunctivae are normal. PERRL. Normal extraocular movements. ENT   Head: Normocephalic and atraumatic.   Nose: No congestion/rhinnorhea.   Mouth/Throat: Mucous membranes are moist.   Neck: No stridor. Hematological/Lymphatic/Immunilogical: No cervical lymphadenopathy. Cardiovascular: Normal rate, regular rhythm.  No murmurs, rubs, or gallops. Respiratory: Normal respiratory effort without tachypnea nor retractions. Breath sounds are clear and equal bilaterally. No wheezes/rales/rhonchi. Gastrointestinal: Soft and slightly distended. Tender over the right upper quadrant. No other tenderness. No rebound. No guarding. Genitourinary: Deferred Musculoskeletal: Normal range of motion in all extremities. No joint effusions.  No lower extremity tenderness nor edema. Neurologic:  Slightly slow speech. Moving all extremities. Asterixis present. Skin:  Skin is warm, dry and intact. No rash noted. Psychiatric: Mood and affect are normal. Speech and behavior are normal. Patient exhibits appropriate insight and judgment.  ____________________________________________    LABS (pertinent positives/negatives)  Labs Reviewed  CBC WITH DIFFERENTIAL/PLATELET -  Abnormal; Notable for the following:    RBC 3.41 (*)    Hemoglobin 9.9 (*)    HCT 30.1 (*)    RDW 16.0 (*)    Platelets 136 (*)    All other components within normal limits  COMPREHENSIVE METABOLIC PANEL - Abnormal; Notable for the following:    Glucose, Bld 156 (*)    Calcium 8.6 (*)    All other components within normal limits  ETHANOL - Abnormal; Notable for the following:    Alcohol, Ethyl (B) 10 (*)    All other components within normal limits  AMMONIA - Abnormal; Notable for the following:    Ammonia 155 (*)    All other components within normal limits     ____________________________________________   EKG  Howard [name]  ____________________________________________    RADIOLOGY  Howard [name]  ____________________________________________   PROCEDURES  Procedure(s) performed: Howard [name]  Critical Care performed: No  ____________________________________________   INITIAL IMPRESSION / ASSESSMENT AND PLAN / ED COURSE  Pertinent labs & imaging results that were available during my care of the patient were reviewed by me and considered in my medical decision making (see chart for details).  Patient presented to the emergency department today because of concerns for possible elevated ammonia level. Patient does have his surgery cirrhosis unfortunately he continues to drink. His ammonia level was quite elevated today 155. Patient did have asterixis. Patient was sitting feels slightly more confused. When I asked the mother what she thought of the patient's mental/she says that she thinks he has seemed okay although she states she has not interacted with him a lot today. At this point patient has had some tenderness over his right upper quadrant. No diffuse or generalized tenderness. No fevers. I doubt SBP at this time. Will plan on giving him lactulose. Will plan on admission to the hospital service.  ____________________________________________   FINAL CLINICAL IMPRESSION(S) / ED  DIAGNOSES  Final diagnoses:  Hepatic cirrhosis, unspecified hepatic cirrhosis type (HCC)  Hepatic encephalopathy (HCC)     Note: This dictation was prepared with Dragon dictation. Any transcriptional errors that result from this process are unintentional    Phineas Semen, MD 02/10/16 [phone]

## 2016-02-10 NOTE — ED Notes (Signed)
Patient states shakes started a few days ago.  Patient states he drank 4 12oz [employer].  Patient states he drinks about 4x per week. Usually only drinks 2 quarts of beer.  Patient's mother at bedside says he needs to be checked for fluid on his liver because he has had that before.

## 2016-02-11 ENCOUNTER — Inpatient Hospital Stay: Payer: Medicare Other

## 2016-02-11 LAB — COMPREHENSIVE METABOLIC PANEL
ALBUMIN: 3.3 g/dL — AB (ref 3.5–5.0)
ALK PHOS: 87 U/L (ref 38–126)
ALT: 26 U/L (ref 17–63)
ANION GAP: 4 — AB (ref 5–15)
AST: 36 U/L (ref 15–41)
BILIRUBIN TOTAL: 1.2 mg/dL (ref 0.3–1.2)
BUN: 11 mg/dL (ref 6–20)
CALCIUM: 8.7 mg/dL — AB (ref 8.9–10.3)
CO2: 27 mmol/L (ref 22–32)
Chloride: 109 mmol/L (ref 101–111)
Creatinine, Ser: 0.7 mg/dL (ref 0.61–1.24)
GLUCOSE: 89 mg/dL (ref 65–99)
POTASSIUM: 3.7 mmol/L (ref 3.5–5.1)
Sodium: 140 mmol/L (ref 135–145)
TOTAL PROTEIN: 6.9 g/dL (ref 6.5–8.1)

## 2016-02-11 LAB — GLUCOSE, CAPILLARY
GLUCOSE-CAPILLARY: 84 mg/dL (ref 65–99)
GLUCOSE-CAPILLARY: 79 mg/dL (ref 65–99)
Glucose-Capillary: 95 mg/dL (ref 65–99)
Glucose-Capillary: 84 mg/dL (ref 65–99)

## 2016-02-11 LAB — CBC
HEMATOCRIT: 28.7 % — AB (ref 40.0–52.0)
Hemoglobin: 9.5 g/dL — ABNORMAL LOW (ref 13.0–18.0)
MCH: 29 pg (ref 26.0–34.0)
MCHC: 33 g/dL (ref 32.0–36.0)
MCV: 87.8 fL (ref 80.0–100.0)
Platelets: 120 10*3/uL — ABNORMAL LOW (ref 150–440)
RBC: 3.27 MIL/uL — ABNORMAL LOW (ref 4.40–5.90)
RDW: 15.4 % — AB (ref 11.5–14.5)
WBC: 5.9 10*3/uL (ref 3.8–10.6)

## 2016-02-11 LAB — URINALYSIS COMPLETE WITH MICROSCOPIC (ARMC ONLY)
Bacteria, UA: NONE SEEN
Bilirubin Urine: NEGATIVE
Glucose, UA: NEGATIVE mg/dL
KETONES UR: NEGATIVE mg/dL
LEUKOCYTES UA: NEGATIVE
Nitrite: NEGATIVE
PH: 8 (ref 5.0–8.0)
PROTEIN: NEGATIVE mg/dL
SPECIFIC GRAVITY, URINE: 1.01 (ref 1.005–1.030)
WBC UA: NONE SEEN WBC/hpf (ref 0–5)

## 2016-02-11 LAB — AMMONIA: AMMONIA: 112 umol/L — AB (ref 9–35)

## 2016-02-11 MED ORDER — RIFAXIMIN 550 MG PO TABS
550.0000 mg | ORAL_TABLET | Freq: Two times a day (BID) | ORAL | Status: DC
  Administered 2016-02-11 – 2016-02-12 (×3): 550 mg via ORAL
  Filled 2016-02-11 (×3): qty 1

## 2016-02-11 MED ORDER — SPIRONOLACTONE 25 MG PO TABS
25.0000 mg | ORAL_TABLET | Freq: Every day | ORAL | Status: DC
  Administered 2016-02-11 – 2016-02-12 (×2): 25 mg via ORAL
  Filled 2016-02-11: qty 1

## 2016-02-11 MED ORDER — NADOLOL 20 MG PO TABS
40.0000 mg | ORAL_TABLET | Freq: Every day | ORAL | Status: DC
  Administered 2016-02-12: 40 mg via ORAL
  Filled 2016-02-11: qty 2

## 2016-02-11 MED ORDER — NADOLOL 20 MG PO TABS
20.0000 mg | ORAL_TABLET | Freq: Every day | ORAL | Status: DC
  Administered 2016-02-11: 20 mg via ORAL

## 2016-02-11 NOTE — Plan of Care (Signed)
Problem: Safety: Goal: Ability to remain free from injury will improve Outcome: Progressing Pt has remained free from falls during my care.   Problem: Pain Managment: Goal: General experience of comfort will improve Outcome: Progressing Pt has not complained of pain during my care.

## 2016-02-11 NOTE — Progress Notes (Signed)
[facility] [place] at [facility]   PATIENT NAME: Howard [name]    MR#:  [id number]  DATE OF BIRTH:  03/22/1981  SUBJECTIVE:  CHIEF COMPLAINT:   Chief Complaint  Patient presents with  . Shaking    hands shaking   - Patient has alcoholic liver cirrhosis, several admissions for encephalopathy comes again with worsening tremors and confusion. -Ammonia was noted to be at 155. Started drinking alcohol recently again. -Improving now.  REVIEW OF SYSTEMS:  Review of Systems  Constitutional: Negative for fever and chills.  HENT: Negative for ear discharge, ear pain and nosebleeds.   Respiratory: Negative for cough, shortness of breath and wheezing.   Cardiovascular: Positive for leg swelling. Negative for chest pain and palpitations.  Gastrointestinal: Positive for nausea and abdominal pain. Negative for vomiting, diarrhea and constipation.  Genitourinary: Negative for dysuria.  Musculoskeletal: Positive for myalgias.  Neurological: Positive for tremors and weakness. Negative for dizziness, sensory change, speech change, focal weakness, seizures and headaches.  Psychiatric/Behavioral: Negative for depression.    DRUG ALLERGIES:  No Known Allergies  VITALS:  Blood pressure 153/88, pulse 76, temperature 98.1 F (36.7 C), temperature source Oral, resp. rate 18, height 6\' 3"  (1.905 m), weight 141.522 kg (312 lb), SpO2 94 %.  PHYSICAL EXAMINATION:  Physical Exam  \GENERAL:  35 y.o.-year-old patient lying in the bed with no acute distress.  EYES: Pupils equal, round, reactive to light and accommodation. No scleral icterus. Extraocular muscles intact.  HEENT: Head atraumatic, normocephalic. Oropharynx and nasopharynx clear.  NECK:  Supple, no jugular venous distention. No thyroid enlargement, no tenderness.  LUNGS: Normal breath sounds bilaterally, no wheezing, rales,rhonchi or crepitation. No use of accessory muscles of respiration.  CARDIOVASCULAR: S1, S2  normal. No murmurs, rubs, or gallops.  ABDOMEN: Soft, nontender, nondistended. Bowel sounds present. No organomegaly or mass.  EXTREMITIES: No pedal edema, cyanosis, or clubbing.  asterexis present NEUROLOGIC: Cranial nerves II through XII are intact. Muscle strength 5/5 in all extremities. Sensation intact. Gait not checked.  PSYCHIATRIC: The patient is alert and oriented x 2-3.  SKIN: No obvious rash, lesion, or ulcer.    LABORATORY PANEL:   CBC  Recent Labs Lab 02/11/16 0356  WBC 5.9  HGB 9.5*  HCT 28.7*  PLT 120*   ------------------------------------------------------------------------------------------------------------------  Chemistries   Recent Labs Lab 02/11/16 0356  NA 140  K 3.7  CL 109  CO2 27  GLUCOSE 89  BUN 11  CREATININE 0.70  CALCIUM 8.7*  AST 36  ALT 26  ALKPHOS 87  BILITOT 1.2   ------------------------------------------------------------------------------------------------------------------  Cardiac Enzymes No results for input(s): TROPONINI in the last 168 hours. ------------------------------------------------------------------------------------------------------------------  RADIOLOGY:  No results found.  EKG:   Orders placed or performed during the hospital encounter of 12/07/15  . ED EKG within 10 minutes  . ED EKG within 10 minutes  . EKG    ASSESSMENT AND PLAN:   Howard [name] is a 35 y.o. male with a known history of alcoholic liver cirrhosis, hypertension, schizophrenia, diabetes mellitus, COPD on 2 L oxygen, recent admission for hepatic encephalopathy last week presents again to the hospital secondary to worsening confusion and also tremors that started yesterday.  #1 hepatic encephalopathy- says his last drink was 2 days prior to admission  - Ammonia at 155- started on lactulose and also rifaximin. - Ammonia improving -Physical therapy consult for weakness.  #2 alcoholic liver cirrhosis-has chronic thrombocytopenia  which is at baseline. -Continue Nadolol as history of esophageal varices  that have been banded -Continue on Aldactone.also on lasix  #3 hypertension-on losartan, hydrochlorothiazide and nadolol. Also lasix and aldactone  #4 diabetes mellitus-on metformin  #5 schizophrenia-seems to be pleasant at this time. Continue Zyprexa at bedtime Continue his Celexa for depression  #6 DVT prophylaxis-on Lovenox.      All the records are reviewed and case discussed with Care Management/Social Workerr. Management plans discussed with the patient, family and they are in agreement.  CODE STATUS: Full code.   TOTAL TIME TAKING CARE OF THIS PATIENT: 37 minutes.   POSSIBLE D/C IN 2 DAYS, DEPENDING ON CLINICAL CONDITION.   Enid Baas M.D on 02/11/2016 at 9:26 AM  Between 7am to 6pm - Pager - [phone]  After 6pm go to [email or web address] - password [email or web address] [email or web address]  [place] [facility]  Office  [phone]  CC: Primary care physician; Marisue Ivan, MD

## 2016-02-12 LAB — BASIC METABOLIC PANEL
ANION GAP: 9 (ref 5–15)
BUN: 13 mg/dL (ref 6–20)
CALCIUM: 9.5 mg/dL (ref 8.9–10.3)
CO2: 26 mmol/L (ref 22–32)
Chloride: 102 mmol/L (ref 101–111)
Creatinine, Ser: 0.9 mg/dL (ref 0.61–1.24)
GFR calc Af Amer: >60 mL/min (ref >60)
GFR calc non Af Amer: >60 mL/min (ref >60)
GLUCOSE: 83 mg/dL (ref 65–99)
POTASSIUM: 3.6 mmol/L (ref 3.5–5.1)
Sodium: 137 mmol/L (ref 135–145)

## 2016-02-12 LAB — GLUCOSE, CAPILLARY
GLUCOSE-CAPILLARY: 112 mg/dL — AB (ref 65–99)
Glucose-Capillary: 78 mg/dL (ref 65–99)

## 2016-02-12 LAB — AMMONIA: Ammonia: 54 umol/L — ABNORMAL HIGH (ref 9–35)

## 2016-02-12 MED ORDER — FUROSEMIDE 20 MG PO TABS: 20.0000 mg | ORAL_TABLET | Freq: Two times a day (BID) | ORAL | Status: DC

## 2016-02-12 MED ORDER — LACTULOSE 10 GM/15ML PO SOLN: 30.0000 g | Freq: Two times a day (BID) | ORAL | Status: DC

## 2016-02-12 MED ORDER — RIFAXIMIN 550 MG PO TABS: 550.0000 mg | ORAL_TABLET | Freq: Two times a day (BID) | ORAL | Status: DC

## 2016-02-12 MED ORDER — OMEPRAZOLE 40 MG PO CPDR: 40.0000 mg | DELAYED_RELEASE_CAPSULE | ORAL | Status: DC

## 2016-02-12 MED ORDER — SPIRONOLACTONE 25 MG PO TABS: 25.0000 mg | ORAL_TABLET | Freq: Every day | ORAL | Status: DC

## 2016-02-12 MED ORDER — NADOLOL 40 MG PO TABS: 40.0000 mg | ORAL_TABLET | Freq: Every day | ORAL | Status: DC

## 2016-02-12 NOTE — Progress Notes (Signed)
[facility]  Patient Status: Active  [facility] is providing the following services: SN  If patient discharges after hours, please call [phone].   Dimple CaseyJason E Hinton 02/12/2016, 9:38 AM

## 2016-02-12 NOTE — Evaluation (Signed)
Physical Therapy Evaluation Patient Details Name: Howard LinemanJoey A [name] MRN: [id number] DOB: November 04, 1980 Today's Date: 02/12/2016   History of Present Illness  Pt admitted for B UE tremors and confusion. Pt with history of alcohol/liver cirrhosis, hyperlipidemia, DM, schizophrenia, depression and COPD.   Clinical Impression  Pt is a pleasant 35 year old male who was admitted for tremors and increased confusion. Pt appears to be at baseline functioning now. Pt performs bed mobility independently, transfers with mod I, and ambulation with cga and BRW. Additional person used to follow behind pt with recliner. RN reports buckling noted this date without RW. Recommend RW at this time to provide increased stability and decrease risk of falls. No buckling noted with use of BRW.  Pt demonstrates deficits with endurance/mobility. Pt very close to baseline level. Would benefit from skilled PT to address above deficits and promote optimal return to PLOF.       Follow Up Recommendations No PT follow up    Equipment Recommendations  None recommended by PT    Recommendations for Other Services       Precautions / Restrictions Precautions Precautions: Fall Restrictions Weight Bearing Restrictions: No      Mobility  Bed Mobility Overal bed mobility: Independent             General bed mobility comments: safe technique performed  Transfers Overall transfer level: Modified independent Equipment used: Rolling walker (2 wheeled) Transfers: Sit to/from Stand Sit to Stand: Min guard         General transfer comment: safe technique performed  Ambulation/Gait Ambulation/Gait assistance: Min guard Ambulation Distance (Feet): 200 Feet Assistive device: Rolling walker (2 wheeled) Gait Pattern/deviations: Step-through pattern     General Gait Details: reciprocal gait pattern performed. Good gait speed with no buckling noted. Pt able to follow commands for directional changes. Additional person  following with recliner.   Stairs            Wheelchair Mobility    Modified Rankin (Stroke Patients Only)       Balance Overall balance assessment: History of Falls;Modified Independent Sitting-balance support: Feet supported Sitting balance-Leahy Scale: Normal     Standing balance support: Bilateral upper extremity supported Standing balance-Leahy Scale: Good                               Pertinent Vitals/Pain Pain Assessment: Faces Faces Pain Scale: Hurts a little bit Pain Location: R hip and L knee with exertion Pain Descriptors / Indicators: Dull;Discomfort Pain Intervention(s): Limited activity within patient's tolerance    Home Living Family/patient expects to be discharged to:: Private residence Living Arrangements: Parent Available Help at Discharge: Family;Available 24 hours/day Type of Home: House Home Access: Stairs to enter Entrance Stairs-Rails: Can reach both Entrance Stairs-Number of Steps: 5 Home Layout: One level Home Equipment: Walker - 2 wheels;Cane - single point;Bedside commode      Prior Function Level of Independence: Independent         Comments: Pt reports ambulating household distances independently, no AD     Hand Dominance        Extremity/Trunk Assessment   Upper Extremity Assessment: Overall WFL for tasks assessed           Lower Extremity Assessment: Generalized weakness (grossly 4+/5)         Communication   Communication: No difficulties  Cognition Arousal/Alertness: Awake/alert Behavior During Therapy: WFL for tasks assessed/performed Overall Cognitive Status: History of  cognitive impairments - at baseline                      General Comments      Exercises Other Exercises Other Exercises: supine ther-ex performed on B LE including 12 reps of ankle pumps, quad sets, SLRs, SAQ, and hip add squeezes. All ther-ex performed x supervision      Assessment/Plan    PT Assessment  Patient needs continued PT services  PT Diagnosis Difficulty walking   PT Problem List Decreased strength;Decreased activity tolerance;Decreased balance  PT Treatment Interventions Gait training;Balance training   PT Goals (Current goals can be found in the Care Plan section) Acute Rehab PT Goals Patient Stated Goal: to get back home PT Goal Formulation: With patient Time For Goal Achievement: 02/26/16 Potential to Achieve Goals: Good    Frequency Min 2X/week   Barriers to discharge        Co-evaluation               End of Session Equipment Utilized During Treatment: Gait belt Activity Tolerance: Patient tolerated treatment well Patient left: in bed;with call bell/phone within reach;with bed alarm set;with family/visitor present Nurse Communication: Mobility status         Time: 1610-9604 PT Time Calculation (min) (ACUTE ONLY): 19 min   Charges:   PT Evaluation $PT Eval Low Complexity: 1 Procedure PT Treatments $Therapeutic Exercise: 8-22 mins   PT G Codes:        Domingo Fuson 02/13/16, 10:11 AM Elizabeth Palau, PT, DPT [phone]

## 2016-02-12 NOTE — Discharge Planning (Signed)
Pt IV removed.  Pt DC papers given, explained and educated.  Pt FU appt scheduled and told pt of suggested FU.  Pt also informed of scripts sent to pharm.  RN assessment and VS revealed stability for DC to home and [facility] will contacted pt to continue services as needed.  Pt will be wheeled to front and family to transport home via car.

## 2016-02-12 NOTE — Care Management Important Message (Signed)
Important Message  Patient Details  Name: Howard [name] MRN: [id number] Date of Birth: [birth year]   Medicare Important Message Given:  Yes    Howard [name] 02/12/2016, 11:33 AM

## 2016-02-12 NOTE — Care Management (Addendum)
PT is not recommending HHPT however [facility] and will need orders to resume care. RNCM will continue to follow. No DME needed at this time however patient has a front-wheeled walker he can use at home. His PCP is Dr. Burnadette PopLinthavong. [facility] notified of patient discharge. Case closed.

## 2016-02-12 NOTE — Discharge Summary (Signed)
[facility] [place] [facility]   PATIENT NAME: Howard [name]    MR#:  [id number]  DATE OF BIRTH:  [birth year]  DATE OF ADMISSION:  02/10/2016 ADMITTING PHYSICIAN: Ramonita LabAruna Gouru, MD  DATE OF DISCHARGE: 02/12/2016  PRIMARY CARE PHYSICIAN: Marisue IvanLINTHAVONG, [name], MD    ADMISSION DIAGNOSIS:  Hepatic encephalopathy (HCC) [K72.90] Ascites [R18.8] Hepatic cirrhosis, unspecified hepatic cirrhosis type (HCC) [K74.60]  DISCHARGE DIAGNOSIS:  Active Problems:   Hepatic encephalopathy (HCC)   SECONDARY DIAGNOSIS:   Past Medical History  Diagnosis Date  . Hypertension   . Schizophrenia (HCC)   . Hyperlipemia   . Depression   . Diabetes (HCC)   . Liver disease   . Diabetes mellitus, type II (HCC)   . Heart disease   . Alcoholic cirrhosis of liver with ascites (HCC)   . Esophageal varices (HCC)   . Portal hypertensive gastropathy   . COPD (chronic obstructive pulmonary disease) [facility](HCC)     HOSPITAL COURSE:   Howard [name] is a 35 y.o. male with a known history of alcoholic liver cirrhosis, hypertension, schizophrenia, diabetes mellitus, COPD on 2 L oxygen, recent admission for hepatic encephalopathy last week presents again to the hospital secondary to worsening confusion and also tremors that started yesterday.  #1 hepatic encephalopathy- says his last drink was 2 days prior to admission  - Ammonia at 155 on admission- on lactulose and also rifaximin. - Ammonia improving, down to 54. Mental status is back to baseline. Resolved asterixis -Worked well with physical therapy -Will be discharged home today  #2 alcoholic liver cirrhosis-has chronic thrombocytopenia which is at baseline. -Continue Nadolol as history of esophageal varices that have been banded -Continue on Aldactone.also on lasix  #3 hypertension-on losartan, hydrochlorothiazide and nadolol. Also lasix and aldactone Aldactone is a new medicine for him. Also metoprolol replaced with nadolol to  decrease portal hypertension  #4 diabetes mellitus-on metformin  #5 schizophrenia-pleasant at this time. Continue Zyprexa at bedtime Continue his Celexa for depression   Discharged home.  DISCHARGE CONDITIONS:   Stable  CONSULTS OBTAINED:   none  DRUG ALLERGIES:  No Known Allergies  DISCHARGE MEDICATIONS:   Current Discharge Medication List    START taking these medications   Details  nadolol (CORGARD) 40 MG tablet Take 1 tablet (40 mg total) by mouth daily. Qty: 30 tablet, Refills: 2    rifaximin (XIFAXAN) 550 MG TABS tablet Take 1 tablet (550 mg total) by mouth 2 (two) times daily. Qty: 60 tablet, Refills: 2    spironolactone (ALDACTONE) 25 MG tablet Take 1 tablet (25 mg total) by mouth daily. Qty: 30 tablet, Refills: 2      CONTINUE these medications which have CHANGED   Details  furosemide (LASIX) 20 MG tablet Take 1 tablet (20 mg total) by mouth 2 (two) times daily. Qty: 30 tablet, Refills: 2    lactulose (CHRONULAC) 10 GM/15ML solution Take 45 mLs (30 g total) by mouth 2 (two) times daily. Qty: 236 mL, Refills: 5    omeprazole (PRILOSEC) 40 MG capsule Take 1 capsule (40 mg total) by mouth every morning. Qty: 30 capsule, Refills: 2      CONTINUE these medications which have NOT CHANGED   Details  albuterol (PROVENTIL HFA;VENTOLIN HFA) 108 (90 BASE) MCG/ACT inhaler Inhale 2 puffs into the lungs every 6 (six) hours as needed for wheezing or shortness of breath. Reported on 11/11/2015    aspirin EC 81 MG tablet Take 81 mg by mouth daily.  citalopram (CELEXA) 20 MG tablet Take 1 tablet (20 mg total) by mouth daily. Qty: 90 tablet, Refills: 1    diphenhydrAMINE (BENADRYL) 25 mg capsule TAKE 2 CAPSULES BY MOUTH AT BEDTIME Qty: 60 capsule, Refills: 2    losartan-hydrochlorothiazide (HYZAAR) 100-25 MG tablet Take 1 tablet by mouth daily.     metFORMIN (GLUCOPHAGE) 500 MG tablet Take 1 tablet (500 mg total) by mouth 2 (two) times daily with a meal. Qty: 30  tablet, Refills: 0    metoCLOPramide (REGLAN) 10 MG tablet Take 1 tablet (10 mg total) by mouth 3 (three) times daily before meals. Qty: 20 tablet, Refills: 0    Multiple Vitamin (MULTIVITAMIN WITH MINERALS) TABS tablet Take 1 tablet by mouth daily. Qty: 30 tablet, Refills: 0    OLANZapine (ZYPREXA) 15 MG tablet Take 1 tablet (15 mg total) by mouth at bedtime. Qty: 90 tablet, Refills: 1    ondansetron (ZOFRAN) 4 MG tablet Take 1 tablet (4 mg total) by mouth daily as needed for nausea or vomiting. Qty: 20 tablet, Refills: 1    traMADol (ULTRAM) 50 MG tablet Take 1 tablet (50 mg total) by mouth every 6 (six) hours as needed. Qty: 20 tablet, Refills: 0      STOP taking these medications     levofloxacin (LEVAQUIN) 500 MG tablet      metoprolol tartrate (LOPRESSOR) 25 MG tablet      simvastatin (ZOCOR) 40 MG tablet          DISCHARGE INSTRUCTIONS:   1. Advised to be compliant with his medications. 2. PCP follow-up in 1-2 weeks  If you experience worsening of your admission symptoms, develop shortness of breath, life threatening emergency, suicidal or homicidal thoughts you must seek medical attention immediately by calling 911 or calling your MD immediately  if symptoms less severe.  You Must read complete instructions/literature along with all the possible adverse reactions/side effects for all the Medicines you take and that have been prescribed to you. Take any new Medicines after you have completely understood and accept all the possible adverse reactions/side effects.   Please note  You were cared for by a hospitalist during your hospital stay. If you have any questions about your discharge medications or the care you received while you were in the hospital after you are discharged, you can call the unit and asked to speak with the hospitalist on call if the hospitalist that took care of you is not available. Once you are discharged, your primary care physician will handle  any further medical issues. Please note that NO REFILLS for any discharge medications will be authorized once you are discharged, as it is imperative that you return to your primary care physician (or establish a relationship with a primary care physician if you do not have one) for your aftercare needs so that they can reassess your need for medications and monitor your lab values.    Today   CHIEF COMPLAINT:   Chief Complaint  Patient presents with  . Shaking    hands shaking    VITAL SIGNS:  Blood pressure 141/81, pulse 73, temperature 97.9 F (36.6 C), temperature source Oral, resp. rate 16, height 6\' 3"  (1.905 m), weight 141.522 kg (312 lb), SpO2 94 %.  I/O:   Intake/Output Summary (Last 24 hours) at 02/12/16 1403 Last data filed at 02/12/16 0900  Gross per 24 hour  Intake    960 ml  Output      0 ml  Net  960 ml    PHYSICAL EXAMINATION:   Physical Exam  GENERAL: 35 y.o.-year-old patient lying in the bed with no acute distress.  EYES: Pupils equal, round, reactive to light and accommodation. No scleral icterus. Extraocular muscles intact.  HEENT: Head atraumatic, normocephalic. Oropharynx and nasopharynx clear.  NECK: Supple, no jugular venous distention. No thyroid enlargement, no tenderness.  LUNGS: Normal breath sounds bilaterally, no wheezing, rales,rhonchi or crepitation. No use of accessory muscles of respiration.  CARDIOVASCULAR: S1, S2 normal. No murmurs, rubs, or gallops.  ABDOMEN: Soft, nontender, nondistended. Bowel sounds present. No organomegaly or mass.  EXTREMITIES: No pedal edema, cyanosis, or clubbing.  asterexis improved NEUROLOGIC: Cranial nerves II through XII are intact. Muscle strength 5/5 in all extremities. Sensation intact. Gait not checked.  PSYCHIATRIC: The patient is alert and oriented x 3  SKIN: No obvious rash, lesion, or ulcer.   DATA REVIEW:   CBC  Recent Labs Lab 02/11/16 0356  WBC 5.9  HGB 9.5*  HCT 28.7*  PLT  120*    Chemistries   Recent Labs Lab 02/11/16 0356 02/12/16 0338  NA 140 137  K 3.7 3.6  CL 109 102  CO2 27 26  GLUCOSE 89 83  BUN 11 13  CREATININE 0.70 0.90  CALCIUM 8.7* 9.5  AST 36  --   ALT 26  --   ALKPHOS 87  --   BILITOT 1.2  --     Cardiac Enzymes No results for input(s): TROPONINI in the last 168 hours.  Microbiology Results  Results for orders placed or performed during the hospital encounter of 12/05/15  Rapid Influenza A&B Antigens ([facility] only)     Status: None   Collection Time: 12/05/15  7:11 PM  Result Value Ref Range Status   Influenza A ([facility]) NEGATIVE NEGATIVE Final   Influenza B [facility]) NEGATIVE NEGATIVE Final    RADIOLOGY:  US Abdomen Limited  02/11/2016  CLINICAL DATA:  Ascites, alcoholic cirrhosis, diabetes mellitus, hypertension, COPD, history of portal hypertensive gastropexy an esophageal varices EXAM: LIMITED ABDOMEN ULTRASOUND FOR ASCITES TECHNIQUE: Limited ultrasound survey for ascites was performed in all four abdominal quadrants. COMPARISON:  CT abdomen and pelvis 01/16/2016 FINDINGS: No ascites is identified on survey imaging of the 4 quadrants. IMPRESSION: No ascites identified. Electronically Signed   By: Ulyses Southward M.D.   On: 02/11/2016 09:40    EKG:   Orders placed or performed during the hospital encounter of 12/07/15  . ED EKG within 10 minutes  . ED EKG within 10 minutes  . EKG      Management plans discussed with the patient, family and they are in agreement.  CODE STATUS:     Code Status Orders        Start     Ordered   02/10/16 1755  Full code   Continuous     02/10/16 1754    Code Status History    Date Active Date Inactive Code Status Order ID Comments User Context   11/11/2015  5:16 AM 11/13/2015  6:00 PM Full Code [id number]  Ihor Austin, MD Inpatient   11/07/2015 10:02 PM 11/09/2015  4:56 PM Full Code [id number]  Enid Baas, MD Inpatient   10/28/2015  8:14 PM 10/29/2015  2:26 PM Full Code [id number]   Altamese Dilling, MD Inpatient   10/16/2015 11:47 PM 10/17/2015  6:00 PM Full Code [id number]  Oralia Manis, MD Inpatient   07/30/2015  5:35 PM 08/04/2015  8:34 PM Full Code [id number]  Enedina Finner, MD  Inpatient   07/05/2015  1:16 AM 07/08/2015  7:22 PM Full Code [id number]  Oralia Manis, MD Inpatient   05/30/2015  7:28 PM 06/01/2015  3:36 PM Full Code [id number]  Audery Amel, MD Inpatient      TOTAL TIME TAKING CARE OF THIS PATIENT: 37 minutes.    Enid Baas M.D on 02/12/2016 at 2:03 PM  Between 7am to 6pm - Pager - [phone]  After 6pm go to [email or web address] - password [email or web address] [facility]  [place] [facility]  Office  [phone]  CC: Primary care physician; Marisue Ivan, MD

## 2016-03-03 ENCOUNTER — Encounter: Payer: Self-pay | Admitting: Emergency Medicine

## 2016-03-03 ENCOUNTER — Inpatient Hospital Stay
Admission: EM | Admit: 2016-03-03 | Discharge: 2016-03-06 | DRG: 433 | Disposition: A | Discharge status: Home / Self Care | Payer: Medicare Other | Attending: Internal Medicine | Admitting: Internal Medicine

## 2016-03-03 ENCOUNTER — Emergency Department: Payer: Medicare Other

## 2016-03-03 DIAGNOSIS — K7290 Hepatic failure, unspecified without coma: Secondary | ICD-10-CM | POA: Diagnosis not present

## 2016-03-03 DIAGNOSIS — K7682 Hepatic encephalopathy: Secondary | ICD-10-CM

## 2016-03-03 DIAGNOSIS — I10 Essential (primary) hypertension: Secondary | ICD-10-CM | POA: Diagnosis present

## 2016-03-03 DIAGNOSIS — D649 Anemia, unspecified: Secondary | ICD-10-CM | POA: Diagnosis present

## 2016-03-03 DIAGNOSIS — I8500 Esophageal varices without bleeding: Secondary | ICD-10-CM | POA: Diagnosis present

## 2016-03-03 DIAGNOSIS — F209 Schizophrenia, unspecified: Secondary | ICD-10-CM | POA: Diagnosis present

## 2016-03-03 DIAGNOSIS — K766 Portal hypertension: Secondary | ICD-10-CM | POA: Diagnosis present

## 2016-03-03 DIAGNOSIS — J449 Chronic obstructive pulmonary disease, unspecified: Secondary | ICD-10-CM | POA: Diagnosis present

## 2016-03-03 DIAGNOSIS — E785 Hyperlipidemia, unspecified: Secondary | ICD-10-CM | POA: Diagnosis present

## 2016-03-03 DIAGNOSIS — K7040 Alcoholic hepatic failure without coma: Principal | ICD-10-CM | POA: Diagnosis present

## 2016-03-03 DIAGNOSIS — D696 Thrombocytopenia, unspecified: Secondary | ICD-10-CM | POA: Diagnosis present

## 2016-03-03 DIAGNOSIS — F1010 Alcohol abuse, uncomplicated: Secondary | ICD-10-CM | POA: Diagnosis present

## 2016-03-03 DIAGNOSIS — E119 Type 2 diabetes mellitus without complications: Secondary | ICD-10-CM | POA: Diagnosis present

## 2016-03-03 DIAGNOSIS — R251 Tremor, unspecified: Secondary | ICD-10-CM | POA: Diagnosis present

## 2016-03-03 DIAGNOSIS — Z7984 Long term (current) use of oral hypoglycemic drugs: Secondary | ICD-10-CM

## 2016-03-03 DIAGNOSIS — K922 Gastrointestinal hemorrhage, unspecified: Secondary | ICD-10-CM | POA: Diagnosis present

## 2016-03-03 DIAGNOSIS — Z7982 Long term (current) use of aspirin: Secondary | ICD-10-CM | POA: Diagnosis not present

## 2016-03-03 DIAGNOSIS — F329 Major depressive disorder, single episode, unspecified: Secondary | ICD-10-CM | POA: Diagnosis present

## 2016-03-03 DIAGNOSIS — K7030 Alcoholic cirrhosis of liver without ascites: Secondary | ICD-10-CM | POA: Diagnosis present

## 2016-03-03 DIAGNOSIS — K3189 Other diseases of stomach and duodenum: Secondary | ICD-10-CM | POA: Diagnosis present

## 2016-03-03 DIAGNOSIS — Z9119 Patient's noncompliance with other medical treatment and regimen: Secondary | ICD-10-CM

## 2016-03-03 DIAGNOSIS — J9610 Chronic respiratory failure, unspecified whether with hypoxia or hypercapnia: Secondary | ICD-10-CM | POA: Diagnosis present

## 2016-03-03 LAB — GLUCOSE, CAPILLARY
GLUCOSE-CAPILLARY: 95 mg/dL (ref 65–99)
Glucose-Capillary: 144 mg/dL — ABNORMAL HIGH (ref 65–99)

## 2016-03-03 LAB — COMPREHENSIVE METABOLIC PANEL
ALT: 34 U/L (ref 17–63)
AST: 42 U/L — ABNORMAL HIGH (ref 15–41)
Albumin: 3.6 g/dL (ref 3.5–5.0)
Alkaline Phosphatase: 123 U/L (ref 38–126)
Anion gap: 5 (ref 5–15)
BILIRUBIN TOTAL: 0.4 mg/dL (ref 0.3–1.2)
BUN: 12 mg/dL (ref 6–20)
CHLORIDE: 107 mmol/L (ref 101–111)
CO2: 26 mmol/L (ref 22–32)
Calcium: 8.3 mg/dL — ABNORMAL LOW (ref 8.9–10.3)
Creatinine, Ser: 0.72 mg/dL (ref 0.61–1.24)
Glucose, Bld: 134 mg/dL — ABNORMAL HIGH (ref 65–99)
POTASSIUM: 3.9 mmol/L (ref 3.5–5.1)
Sodium: 138 mmol/L (ref 135–145)
TOTAL PROTEIN: 7 g/dL (ref 6.5–8.1)

## 2016-03-03 LAB — LIPASE, BLOOD: LIPASE: 26 U/L (ref 11–51)

## 2016-03-03 LAB — CBC
HEMATOCRIT: 29.6 % — AB (ref 40.0–52.0)
Hemoglobin: 9.7 g/dL — ABNORMAL LOW (ref 13.0–18.0)
MCH: 28.2 pg (ref 26.0–34.0)
MCHC: 32.7 g/dL (ref 32.0–36.0)
MCV: 86.1 fL (ref 80.0–100.0)
PLATELETS: 136 10*3/uL — AB (ref 150–440)
RBC: 3.44 MIL/uL — ABNORMAL LOW (ref 4.40–5.90)
RDW: 15.9 % — AB (ref 11.5–14.5)
WBC: 6.3 10*3/uL (ref 3.8–10.6)

## 2016-03-03 LAB — HEMOGLOBIN: Hemoglobin: 9.9 g/dL — ABNORMAL LOW (ref 13.0–18.0)

## 2016-03-03 LAB — LACTIC ACID, PLASMA
LACTIC ACID, VENOUS: 1 mmol/L (ref 0.5–2.0)
Lactic Acid, Venous: 0.9 mmol/L (ref 0.5–2.0)

## 2016-03-03 LAB — AMMONIA: AMMONIA: 179 umol/L — AB (ref 9–35)

## 2016-03-03 MED ORDER — ONDANSETRON HCL 4 MG PO TABS: 4.0000 mg | ORAL_TABLET | Freq: Four times a day (QID) | ORAL | Status: DC | PRN

## 2016-03-03 MED ORDER — LACTULOSE 10 GM/15ML PO SOLN
30.0000 g | Freq: Three times a day (TID) | ORAL | Status: DC
  Administered 2016-03-03 – 2016-03-06 (×9): 30 g via ORAL
  Filled 2016-03-03 (×9): qty 60

## 2016-03-03 MED ORDER — SODIUM CHLORIDE 0.9% FLUSH: 3.0000 mL | INTRAVENOUS | Status: DC | PRN

## 2016-03-03 MED ORDER — OXYCODONE HCL 5 MG PO TABS: 5.0000 mg | ORAL_TABLET | ORAL | Status: DC | PRN

## 2016-03-03 MED ORDER — RIFAXIMIN 550 MG PO TABS
550.0000 mg | ORAL_TABLET | Freq: Two times a day (BID) | ORAL | Status: DC
  Administered 2016-03-03 – 2016-03-06 (×7): 550 mg via ORAL
  Filled 2016-03-03 (×7): qty 1

## 2016-03-03 MED ORDER — METOCLOPRAMIDE HCL 10 MG PO TABS
10.0000 mg | ORAL_TABLET | Freq: Three times a day (TID) | ORAL | Status: DC
  Administered 2016-03-03 – 2016-03-06 (×8): 10 mg via ORAL
  Filled 2016-03-03 (×9): qty 1

## 2016-03-03 MED ORDER — LOSARTAN POTASSIUM 50 MG PO TABS
100.0000 mg | ORAL_TABLET | Freq: Every day | ORAL | Status: DC
  Administered 2016-03-04 – 2016-03-06 (×3): 100 mg via ORAL
  Filled 2016-03-03 (×3): qty 2

## 2016-03-03 MED ORDER — LACTULOSE 10 GM/15ML PO SOLN
30.0000 g | Freq: Once | ORAL | Status: AC
  Administered 2016-03-03: 30 g via ORAL
  Filled 2016-03-03: qty 60

## 2016-03-03 MED ORDER — POLYETHYLENE GLYCOL 3350 17 G PO PACK: 17.0000 g | PACK | Freq: Every day | ORAL | Status: DC | PRN

## 2016-03-03 MED ORDER — ENOXAPARIN SODIUM 40 MG/0.4ML ~~LOC~~ SOLN: 40.0000 mg | SUBCUTANEOUS | Status: DC

## 2016-03-03 MED ORDER — SODIUM CHLORIDE 0.9 % IV SOLN: 250.0000 mL | INTRAVENOUS | Status: DC | PRN

## 2016-03-03 MED ORDER — SPIRONOLACTONE 25 MG PO TABS
25.0000 mg | ORAL_TABLET | Freq: Every day | ORAL | Status: DC
  Administered 2016-03-03 – 2016-03-06 (×4): 25 mg via ORAL
  Filled 2016-03-03 (×4): qty 1

## 2016-03-03 MED ORDER — SODIUM CHLORIDE 0.9% FLUSH
3.0000 mL | Freq: Two times a day (BID) | INTRAVENOUS | Status: DC
  Administered 2016-03-03 – 2016-03-05 (×4): 3 mL via INTRAVENOUS

## 2016-03-03 MED ORDER — METOPROLOL TARTRATE 25 MG PO TABS
25.0000 mg | ORAL_TABLET | Freq: Two times a day (BID) | ORAL | Status: DC
  Administered 2016-03-03 – 2016-03-06 (×7): 25 mg via ORAL
  Filled 2016-03-03 (×7): qty 1

## 2016-03-03 MED ORDER — INSULIN ASPART 100 UNIT/ML ~~LOC~~ SOLN: 0.0000 [IU] | Freq: Every day | SUBCUTANEOUS | Status: DC

## 2016-03-03 MED ORDER — PANTOPRAZOLE SODIUM 40 MG PO TBEC
40.0000 mg | DELAYED_RELEASE_TABLET | Freq: Every day | ORAL | Status: DC
  Administered 2016-03-03: 40 mg via ORAL
  Filled 2016-03-03: qty 1

## 2016-03-03 MED ORDER — ALBUTEROL SULFATE (2.5 MG/3ML) 0.083% IN NEBU: 2.5000 mg | INHALATION_SOLUTION | RESPIRATORY_TRACT | Status: DC | PRN

## 2016-03-03 MED ORDER — ACETAMINOPHEN 325 MG PO TABS: 650.0000 mg | ORAL_TABLET | Freq: Four times a day (QID) | ORAL | Status: DC | PRN

## 2016-03-03 MED ORDER — ACETAMINOPHEN 650 MG RE SUPP: 650.0000 mg | Freq: Four times a day (QID) | RECTAL | Status: DC | PRN

## 2016-03-03 MED ORDER — INSULIN ASPART 100 UNIT/ML ~~LOC~~ SOLN
0.0000 [IU] | Freq: Three times a day (TID) | SUBCUTANEOUS | Status: DC
  Administered 2016-03-03 – 2016-03-05 (×2): 1 [IU] via SUBCUTANEOUS
  Filled 2016-03-03 (×2): qty 1

## 2016-03-03 MED ORDER — FUROSEMIDE 20 MG PO TABS
20.0000 mg | ORAL_TABLET | Freq: Two times a day (BID) | ORAL | Status: DC
  Administered 2016-03-03 – 2016-03-06 (×6): 20 mg via ORAL
  Filled 2016-03-03 (×6): qty 1

## 2016-03-03 MED ORDER — PANTOPRAZOLE SODIUM 40 MG PO TBEC
40.0000 mg | DELAYED_RELEASE_TABLET | Freq: Two times a day (BID) | ORAL | Status: DC
  Administered 2016-03-03 – 2016-03-06 (×6): 40 mg via ORAL
  Filled 2016-03-03 (×6): qty 1

## 2016-03-03 MED ORDER — CITALOPRAM HYDROBROMIDE 20 MG PO TABS
20.0000 mg | ORAL_TABLET | Freq: Every day | ORAL | Status: DC
  Administered 2016-03-03 – 2016-03-06 (×4): 20 mg via ORAL
  Filled 2016-03-03 (×4): qty 1

## 2016-03-03 MED ORDER — ONDANSETRON HCL 4 MG/2ML IJ SOLN
4.0000 mg | Freq: Four times a day (QID) | INTRAMUSCULAR | Status: DC | PRN
  Administered 2016-03-06: 4 mg via INTRAVENOUS
  Filled 2016-03-03: qty 2

## 2016-03-03 MED ORDER — OLANZAPINE 10 MG PO TABS
15.0000 mg | ORAL_TABLET | Freq: Every day | ORAL | Status: DC
  Administered 2016-03-03 – 2016-03-05 (×3): 15 mg via ORAL
  Filled 2016-03-03: qty 2
  Filled 2016-03-03 (×5): qty 1

## 2016-03-03 NOTE — ED Provider Notes (Signed)
[facility] Emergency Department Provider Note  ____________________________________________  Time seen: 8:40 AM  I have reviewed the triage vital signs and the nursing notes.   HISTORY  Chief Complaint Other Confusion Additional history obtained from mother at bedside. History is limited by patient's being a poor historian and somewhat confused at this time.  HPI Howard [name] is a 35 y.o. male who is a history of alcohol abuse causing liver failure and cirrhosis. He has ascites, grade 1 varices found on endoscopy in January 2017. He is on medical therapy for this with not a lot and lactulose. Last night he reports drinking some beer to help him go to sleep. Afterward he had worsening confusion. He also is afraid that he had seen some blood in his saliva when he woke up this morning although he has not had any vomiting. Denies any black or bloody stools.  Patient frequently asks similar questions to ones that we have already discussed.He reports feeling like his cognition is slow and cloudy. No recent trauma     Past Medical History  Diagnosis Date  . Hypertension   . Schizophrenia (HCC)   . Hyperlipemia   . Depression   . Diabetes (HCC)   . Liver disease   . Diabetes mellitus, type II (HCC)   . Heart disease   . Alcoholic cirrhosis of liver with ascites (HCC)   . Esophageal varices (HCC)   . Portal hypertensive gastropathy   . COPD (chronic obstructive pulmonary disease) [facility])      Patient Active Problem List   Diagnosis Date Noted  . Controlled type 2 diabetes mellitus without complication (HCC) 01/15/2016  . Normocytic anemia 12/12/2015  . Essential (primary) hypertension 12/11/2015  . Pure hypercholesterolemia 12/11/2015  . Fever 11/11/2015  . Ascites due to alcoholic cirrhosis (HCC) 11/11/2015  . Cirrhosis of liver with ascites (HCC) 11/11/2015  . Hepatic encephalopathy (HCC) 10/16/2015  . Type 2 diabetes mellitus (HCC) 10/16/2015  .  Acute renal failure (ARF) (HCC) 07/30/2015  . 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 Alcoholic cirrhosis (HCC) 07/19/2015  . Hypoxia 07/04/2015  . Elevated transaminase level 07/04/2015  . DOE (dyspnea on exertion) 07/04/2015  . Alcohol abuse 05/31/2015  . Hypertension 05/29/2015  . Paranoid schizophrenia (HCC) 03/20/2015     Past Surgical History  Procedure Laterality Date  . No past surgeries    . Esophagogastroduodenoscopy (egd) with propofol N/A 08/03/2015    Procedure: ESOPHAGOGASTRODUODENOSCOPY (EGD) WITH PROPOFOL;  Surgeon: Elnita Maxwell, MD;  Location: [facility] ENDOSCOPY;  Service: Endoscopy;  Laterality: N/A;  . Esophagogastroduodenoscopy (egd) with propofol N/A 08/31/2015    Procedure: ESOPHAGOGASTRODUODENOSCOPY (EGD) WITH PROPOFOL;  Surgeon: Elnita Maxwell, MD;  Location: [facility] ENDOSCOPY;  Service: Endoscopy;  Laterality: N/A;  . Esophagogastroduodenoscopy N/A 10/05/2015    Procedure: ESOPHAGOGASTRODUODENOSCOPY (EGD);  Surgeon: Elnita Maxwell, MD;  Location: [facility] [place] ENDOSCOPY;  Service: Endoscopy;  Laterality: N/A;     Current Outpatient Rx  Name  Route  Sig  Dispense  Refill  . albuterol (PROVENTIL HFA;VENTOLIN HFA) 108 (90 BASE) MCG/ACT inhaler   Inhalation   Inhale 2 puffs into the lungs every 6 (six) hours as needed for wheezing or shortness of breath. Reported on 11/11/2015         . aspirin EC 81 MG tablet   Oral   Take 81 mg by mouth daily.          . citalopram (CELEXA) 20 MG tablet   Oral  Take 1 tablet (20 mg total) by mouth daily.   90 tablet   1   . furosemide (LASIX) 20 MG tablet   Oral   Take 1 tablet (20 mg total) by mouth 2 (two) times daily.   30 tablet   2   . lactulose (CHRONULAC) 10 GM/15ML solution   Oral   Take 45 mLs (30 g total) by mouth 2 (two) times daily.   236 mL   5   . losartan (COZAAR) 100 MG tablet   Oral   Take 100 mg by mouth daily.         . metFORMIN (GLUCOPHAGE) 500 MG tablet    Oral   Take 1 tablet (500 mg total) by mouth 2 (two) times daily with a meal.   30 tablet   0   . metoprolol tartrate (LOPRESSOR) 25 MG tablet   Oral   Take 25 mg by mouth 2 (two) times daily.         . Multiple Vitamin (MULTIVITAMIN WITH MINERALS) TABS tablet   Oral   Take 1 tablet by mouth daily.   30 tablet   0   . OLANZapine (ZYPREXA) 15 MG tablet   Oral   Take 1 tablet (15 mg total) by mouth at bedtime.   90 tablet   1   . omeprazole (PRILOSEC) 40 MG capsule   Oral   Take 1 capsule (40 mg total) by mouth every morning.   30 capsule   2   . ondansetron (ZOFRAN) 4 MG tablet   Oral   Take 1 tablet (4 mg total) by mouth daily as needed for nausea or vomiting.   20 tablet   1   . metoCLOPramide (REGLAN) 10 MG tablet   Oral   Take 1 tablet (10 mg total) by mouth 3 (three) times daily before meals. Patient not taking: Reported on 03/03/2016   20 tablet   0   . nadolol (CORGARD) 40 MG tablet   Oral   Take 1 tablet (40 mg total) by mouth daily. Patient not taking: Reported on 03/03/2016   30 tablet   2   . rifaximin (XIFAXAN) 550 MG TABS tablet   Oral   Take 1 tablet (550 mg total) by mouth 2 (two) times daily. Patient not taking: Reported on 03/03/2016   60 tablet   2   . spironolactone (ALDACTONE) 25 MG tablet   Oral   Take 1 tablet (25 mg total) by mouth daily. Patient not taking: Reported on 03/03/2016   30 tablet   2   . traMADol (ULTRAM) 50 MG tablet   Oral   Take 1 tablet (50 mg total) by mouth every 6 (six) hours as needed. Patient not taking: Reported on 03/03/2016   20 tablet   0      Allergies Review of patient's allergies indicates no known allergies.   Family History  Problem Relation Age of Onset  . Heart disease Mother   . Hypertension Mother   . Hyperlipidemia Mother   . Stroke Father   . Heart attack Father   . Hypertension Father   . Heart disease Father   . Alcohol abuse Father   . Heart disease Brother     Social  History Social History  Substance Use Topics  . Smoking status: Never Smoker   . Smokeless tobacco: Never Used  . Alcohol Use: 0.0 oz/week    0 Shots of liquor, 0 Standard drinks or equivalent per  week     Comment: last drink 02/09/16    Review of Systems  Constitutional:   No fever or chills.  Eyes:   No vision changes.  ENT:   No sore throat. No rhinorrhea. Cardiovascular:   No chest pain. Respiratory:   No dyspnea or cough. Gastrointestinal:   Chronic abdominal pain with ascites. No vomiting. Or changes in bowel habits.  Genitourinary:   Negative for dysuria or difficulty urinating. Musculoskeletal:   Negative for focal pain or swelling Neurological:   Negative for headaches 10-point ROS otherwise negative.  ____________________________________________   PHYSICAL EXAM:  VITAL SIGNS: ED Triage Vitals  Enc Vitals Group     BP 03/03/16 0812 129/66 mmHg     Pulse Rate 03/03/16 0812 81     Resp 03/03/16 0812 20     Temp 03/03/16 0812 98.1 F (36.7 C)     Temp Source 03/03/16 0812 Oral     SpO2 03/03/16 0812 98 %     Weight 03/03/16 0812 322 lb (146.058 kg)     Height 03/03/16 0812 6\' 3"  (1.905 m)     Head Cir --      Peak Flow --      Pain Score 03/03/16 1124 0     Pain Loc --      Pain Edu? --      Excl. in GC? --     Vital signs reviewed, nursing assessments reviewed.   Constitutional:   Alert and orientedTo person and place. Well appearing and in no distress. Eyes:   No scleral icterus. No conjunctival pallor. PERRL. EOMI.  No nystagmus. ENT   Head:   Normocephalic and atraumatic.   Nose:   No congestion/rhinnorhea. No septal hematoma   Mouth/Throat:   MMM, no pharyngeal erythema. No peritonsillar mass.    Neck:   No stridor. No SubQ emphysema. No meningismus. Hematological/Lymphatic/Immunilogical:   No cervical lymphadenopathy. Cardiovascular:   RRR. Symmetric bilateral radial and DP pulses.  No murmurs.  Respiratory:   Normal respiratory  effort without tachypnea nor retractions. Breath sounds are clear and equal bilaterally. No wheezes/rales/rhonchi. Gastrointestinal:   Soft and nontender. Moderately distended. There is no CVA tenderness.  No rebound, rigidity, or guarding. No tympany. Rectal exam reveals brown stool, Hemoccult negative, QC controls okay. No hemorrhoids Genitourinary:   deferred Musculoskeletal:   Nontender with normal range of motion in all extremities. No joint effusions.  No lower extremity tenderness.  No edema. Neurologic:   Slowed speech. Normal language.  CN 2-10 normal. Motor grossly intact. No gross focal neurologic deficits are appreciated.  Skin:    Skin is warm, dry and intact. No rash noted.  No petechiae, purpura, or bullae.  ____________________________________________    LABS (pertinent positives/negatives) (all labs ordered are listed, but only abnormal results are displayed) Labs Reviewed  CBC - Abnormal; Notable for the following:    RBC 3.44 (*)    Hemoglobin 9.7 (*)    HCT 29.6 (*)    RDW 15.9 (*)    Platelets 136 (*)    All other components within normal limits  COMPREHENSIVE METABOLIC PANEL - Abnormal; Notable for the following:    Glucose, Bld 134 (*)    Calcium 8.3 (*)    AST 42 (*)    All other components within normal limits  AMMONIA - Abnormal; Notable for the following:    Ammonia 179 (*)    All other components within normal limits  CULTURE, BLOOD (ROUTINE X 2)  CULTURE, BLOOD (ROUTINE X 2)  LIPASE, BLOOD  LACTIC ACID, PLASMA  LACTIC ACID, PLASMA   ____________________________________________   EKG  Interpreted by me Sinus rhythm rate of 78, normal axis intervals QRS ST segments and T waves  ____________________________________________    RADIOLOGY  Chest x-ray unremarkable  ____________________________________________   PROCEDURES   ____________________________________________   INITIAL IMPRESSION / ASSESSMENT AND PLAN / ED  COURSE  Pertinent labs & imaging results that were available during my care of the patient were reviewed by me and considered in my medical decision making (see chart for details).  Patient presents with confusion and concerned that he may have some upper GI bleeding after consuming alcohol last night. He is not having any vomiting, I see no evidence of blood in the oropharynx, and rectal exam is negative for Hemoccult blood. His hemoglobin is stable with his chronic anemia. He also describes some ice cravings which may be indicative of pica. Patient given lactulose in the emergency department. Case discussed with hospitalist for further management. No evidence of GI bleed at this time.     ____________________________________________   FINAL CLINICAL IMPRESSION(S) / ED DIAGNOSES  Final diagnoses:  Hepatic encephalopathy (HCC)  Chronic anemia       Portions of this note were generated with dragon dictation software. Dictation errors may occur despite best attempts at proofreading.   Sharman Cheek, MD 03/03/16 1210

## 2016-03-03 NOTE — H&P (Signed)
[facility] [place] [facility]   PATIENT NAME: Howard [name]    MR#:  [id number]  DATE OF BIRTH:  Oct 25, 1980  DATE OF ADMISSION:  03/03/2016  PRIMARY CARE PHYSICIAN: Marisue Ivan, MD   REQUESTING/REFERRING PHYSICIAN: Dr. Scotty Court  CHIEF COMPLAINT:   Chief Complaint  Patient presents with  . Other    HISTORY OF PRESENT ILLNESS:  Howard [name]  is a 35 y.o. male with a known history of Alcoholic liver cirrhosis, hypertension, diabetes, chronic ascites presents to the emergency room due to confusion and some blood in his saliva. No frank blood in vomiting. No melena. He has been found to have ammonia level at 179. Patient mentions that he has been compliant with his medications including lactulose. Has 4-5 bowel movements daily. No shortness of breath or chest pain or abdominal pain. Stool checked for blood in the emergency room is negative for blood. Patient is poor historian. Most of the history obtained from mother at bedside and reviewing old records. Discussed with ER physician. Patient had EGD earlier this year which showed grade 1 esophageal varices.  PAST MEDICAL HISTORY:   Past Medical History  Diagnosis Date  . Hypertension   . Schizophrenia (HCC)   . Hyperlipemia   . Depression   . Diabetes (HCC)   . Liver disease   . Diabetes mellitus, type II (HCC)   . Heart disease   . Alcoholic cirrhosis of liver with ascites (HCC)   . Esophageal varices (HCC)   . Portal hypertensive gastropathy   . COPD (chronic obstructive pulmonary disease) (HCC)     PAST SURGICAL HISTORY:   Past Surgical History  Procedure Laterality Date  . No past surgeries    . Esophagogastroduodenoscopy (egd) with propofol N/A 08/03/2015    Procedure: ESOPHAGOGASTRODUODENOSCOPY (EGD) WITH PROPOFOL;  Surgeon: Elnita Maxwell, MD;  Location: [facility] ENDOSCOPY;  Service: Endoscopy;  Laterality: N/A;  . Esophagogastroduodenoscopy (egd) with propofol N/A 08/31/2015     Procedure: ESOPHAGOGASTRODUODENOSCOPY (EGD) WITH PROPOFOL;  Surgeon: Elnita Maxwell, MD;  Location: [facility] ENDOSCOPY;  Service: Endoscopy;  Laterality: N/A;  . Esophagogastroduodenoscopy N/A 10/05/2015    Procedure: ESOPHAGOGASTRODUODENOSCOPY (EGD);  Surgeon: Elnita Maxwell, MD;  Location: [facility] ENDOSCOPY;  Service: Endoscopy;  Laterality: N/A;    SOCIAL HISTORY:   Social History  Substance Use Topics  . Smoking status: Never Smoker   . Smokeless tobacco: Never Used  . Alcohol Use: 0.0 oz/week    0 Shots of liquor, 0 Standard drinks or equivalent per week     Comment: last drink 02/09/16    FAMILY HISTORY:   Family History  Problem Relation Age of Onset  . Heart disease Mother   . Hypertension Mother   . Hyperlipidemia Mother   . Stroke Father   . Heart attack Father   . Hypertension Father   . Heart disease Father   . Alcohol abuse Father   . Heart disease Brother     DRUG ALLERGIES:  No Known Allergies  REVIEW OF SYSTEMS:   Review of Systems  Constitutional: Positive for malaise/fatigue. Negative for fever and chills.  HENT: Negative for sore throat.   Eyes: Negative for blurred vision, double vision and pain.  Respiratory: Negative for cough, hemoptysis, shortness of breath and wheezing.   Cardiovascular: Negative for chest pain, palpitations, orthopnea and leg swelling.  Gastrointestinal: Positive for nausea. Negative for heartburn, vomiting, abdominal pain, diarrhea and constipation.  Genitourinary: Negative for dysuria and hematuria.  Musculoskeletal: Negative for  back pain and joint pain.  Skin: Negative for rash.  Neurological: Positive for weakness. Negative for sensory change, speech change, focal weakness and headaches.  Endo/Heme/Allergies: Does not bruise/bleed easily.  Psychiatric/Behavioral: Positive for hallucinations and memory loss. Negative for depression. The patient is nervous/anxious.     MEDICATIONS AT HOME:   Prior to Admission  medications   Medication Sig Start Date End Date Taking? Authorizing Provider  albuterol (PROVENTIL HFA;VENTOLIN HFA) 108 (90 BASE) MCG/ACT inhaler Inhale 2 puffs into the lungs every 6 (six) hours as needed for wheezing or shortness of breath. Reported on 11/11/2015   Yes Historical Provider, MD  aspirin EC 81 MG tablet Take 81 mg by mouth daily.    Yes Historical Provider, MD  citalopram (CELEXA) 20 MG tablet Take 1 tablet (20 mg total) by mouth daily. 01/15/16  Yes Audery Amel, MD  furosemide (LASIX) 20 MG tablet Take 1 tablet (20 mg total) by mouth 2 (two) times daily. 02/12/16  Yes Enid Baas, MD  lactulose (CHRONULAC) 10 GM/15ML solution Take 45 mLs (30 g total) by mouth 2 (two) times daily. 02/12/16  Yes Enid Baas, MD  losartan (COZAAR) 100 MG tablet Take 100 mg by mouth daily.   Yes Historical Provider, MD  metFORMIN (GLUCOPHAGE) 500 MG tablet Take 1 tablet (500 mg total) by mouth 2 (two) times daily with a meal. 11/13/15  Yes Katha Hamming, MD  metoprolol tartrate (LOPRESSOR) 25 MG tablet Take 25 mg by mouth 2 (two) times daily.   Yes Historical Provider, MD  Multiple Vitamin (MULTIVITAMIN WITH MINERALS) TABS tablet Take 1 tablet by mouth daily. 08/04/15  Yes Ramonita Lab, MD  OLANZapine (ZYPREXA) 15 MG tablet Take 1 tablet (15 mg total) by mouth at bedtime. 01/15/16  Yes Audery Amel, MD  omeprazole (PRILOSEC) 40 MG capsule Take 1 capsule (40 mg total) by mouth every morning. 02/12/16  Yes Enid Baas, MD  ondansetron (ZOFRAN) 4 MG tablet Take 1 tablet (4 mg total) by mouth daily as needed for nausea or vomiting. 11/20/15  Yes Jene Every, MD  metoCLOPramide (REGLAN) 10 MG tablet Take 1 tablet (10 mg total) by mouth 3 (three) times daily before meals. Patient not taking: Reported on 03/03/2016 11/05/15 11/04/16  Rebecka Apley, MD  nadolol (CORGARD) 40 MG tablet Take 1 tablet (40 mg total) by mouth daily. Patient not taking: Reported on 03/03/2016 02/12/16   Enid Baas, MD  rifaximin (XIFAXAN) 550 MG TABS tablet Take 1 tablet (550 mg total) by mouth 2 (two) times daily. Patient not taking: Reported on 03/03/2016 02/12/16   Enid Baas, MD  spironolactone (ALDACTONE) 25 MG tablet Take 1 tablet (25 mg total) by mouth daily. Patient not taking: Reported on 03/03/2016 02/12/16   Enid Baas, MD  traMADol (ULTRAM) 50 MG tablet Take 1 tablet (50 mg total) by mouth every 6 (six) hours as needed. Patient not taking: Reported on 03/03/2016 11/18/15 11/17/16  Emily Filbert, MD     VITAL SIGNS:  Blood pressure 150/89, pulse 73, temperature 98.1 F (36.7 C), temperature source Oral, resp. rate 19, height 6\' 3"  (1.905 m), weight 146.058 kg (322 lb), SpO2 99 %.  PHYSICAL EXAMINATION:  Physical Exam  GENERAL:  35 y.o.-year-old patient lying in the bed with no acute distress. Obese. EYES: Pupils equal, round, reactive to light and accommodation. No scleral icterus. Extraocular muscles intact.  HEENT: Head atraumatic, normocephalic. Oropharynx and nasopharynx clear. No oropharyngeal erythema, moist oral mucosa  NECK:  Supple, no  jugular venous distention. No thyroid enlargement, no tenderness.  LUNGS: Normal breath sounds bilaterally, no wheezing, rales, rhonchi. No use of accessory muscles of respiration.  CARDIOVASCULAR: S1, S2 normal. No murmurs, rubs, or gallops.  ABDOMEN: Soft, nontender, distended. Bowel sounds present. No organomegaly or mass.  EXTREMITIES: No pedal edema, cyanosis, or clubbing. + 2 pedal & radial pulses b/l.   NEUROLOGIC: Cranial nerves II through XII are intact. No focal Motor or sensory deficits appreciated b/l. Asterixis. PSYCHIATRIC: The patient is alert and awake. Reported to with his answers. Slow to respond. SKIN: No obvious rash, lesion, or ulcer.   LABORATORY PANEL:   CBC  Recent Labs Lab 03/03/16 0847  WBC 6.3  HGB 9.7*  HCT 29.6*  PLT 136*    ------------------------------------------------------------------------------------------------------------------  Chemistries   Recent Labs Lab 03/03/16 0847  NA 138  K 3.9  CL 107  CO2 26  GLUCOSE 134*  BUN 12  CREATININE 0.72  CALCIUM 8.3*  AST 42*  ALT 34  ALKPHOS 123  BILITOT 0.4   ------------------------------------------------------------------------------------------------------------------  Cardiac Enzymes No results for input(s): TROPONINI in the last 168 hours. ------------------------------------------------------------------------------------------------------------------  RADIOLOGY:  Dg Chest Portable 1 View  03/03/2016  CLINICAL DATA:  Upper abdominal pain. EXAM: PORTABLE CHEST 1 VIEW COMPARISON:  Radiograph of December 06, 2015. FINDINGS: The heart size and mediastinal contours are within normal limits. Both lungs are clear. No pneumothorax or pleural effusion is noted. The visualized skeletal structures are unremarkable. IMPRESSION: No acute cardiopulmonary abnormality seen. Electronically Signed   By: Lupita RaiderJames  Green Jr, M.D.   On: 03/03/2016 11:51     IMPRESSION AND PLAN:   * Acute hepatic encephalopathy with ammonia level at 179 Start lactulose 30 g 3 times a day. Repeat ammonia levels tomorrow. Rifaximin. Reason for decompensation is unclear. Noncompliance could be possible cause. GI bleed could be the other etiology of her hemoglobin stable No signs of infection.  * Upper GI bleed? Patient mentions he noticed some blood in his saliva. It is unclear if this was GI bleed. Stool checked for blood in the emergency room is negative. Hemoglobin is stable. We will put him on PPI twice a day. Follow hemoglobin levels. Will need GI consult if there is any further blood noticed on if his hemoglobin drops. Last EGD earlier this year showed grade 1 esophageal varices.  * Diabetes mellitus Start sliding scale insulin. Hold metformin. Diabetic diet.  *  Hypertension Continue home medications along with Lasix and Aldactone.  * Chronic respiratory failure is stable. Continue oxygen at 2 L/min.  * Chronic mild thrombocytopenia is stable  * DVT prophylaxis with SCDs. No heparin or Lovenox due to concern for GI bleed.  All the records are reviewed and case discussed with ED provider. Management plans discussed with the patient, family and they are in agreement.  CODE STATUS: FULL CODE  TOTAL TIME TAKING CARE OF THIS PATIENT: 40 minutes.   Milagros LollSudini, Areli Frary R M.D on 03/03/2016 at 12:16 PM  Between 7am to 6pm - Pager - [phone]  After 6pm go to [email or web address] - password [email or web address] [email or web address]  [facility]  Office  [phone]  CC: Primary care physician; Marisue IvanLINTHAVONG, [name], MD  Note: This dictation was prepared with Dragon dictation along with smaller phrase technology. Any transcriptional errors that result from this process are unintentional.

## 2016-03-03 NOTE — Progress Notes (Addendum)
[facility]  Patient Status: Active  [facility] is providing the following services: SN   If patient discharges after hours, please call [phone].   Dimple CaseyJason E Hinton 03/03/2016, 4:56 PM

## 2016-03-03 NOTE — Care Management Note (Addendum)
Case Management Note  Patient Details  Name: Howard [name] MRN: [id number] Date of Birth: [birth year]  Subjective/Objective:     35yo Howard [name] was admitted 03/03/16 with mental confusion per Ammonia level=179. Hx: Esophageal varices, alcoholic liver cirrhosis, HTN, DM, chronic ascites. By history has a RW, PCP is Dr Alphonzo DublinLinthavong,and chronic oxygen supplied by [facility].  Text to Barbara CowerJason to advise this Clinical research associatewriter if Howard [name] is still open to [facility]. Will attempt to provide a CM assessment tomorrow.               Action/Plan:   Expected Discharge Date:                  Expected Discharge Plan:     In-House Referral:     Discharge planning Services     Post Acute Care Choice:    Choice offered to:     DME Arranged:    DME Agency:     HH Arranged:    HH Agency:     Status of Service:     Medicare Important Message Given:    Date Medicare IM Given:    Medicare IM give by:    Date Additional Medicare IM Given:    Additional Medicare Important Message give by:     If discussed at Long Length of Stay Meetings, dates discussed:    Additional Comments:  Howard [name] A, RN 03/03/2016, 3:18 PM

## 2016-03-03 NOTE — ED Notes (Signed)
Pt presents with bloody saliva started yesterday, states he has  Had it before when the artery in his abd was bleeding, per pt.

## 2016-03-03 NOTE — Progress Notes (Deleted)
O2 SAT DOWN TO 76. PT SWITCHED BACK TO NONRE-BREATHER MASK

## 2016-03-03 NOTE — Progress Notes (Signed)
BS 95 bedtime snacks given.

## 2016-03-04 LAB — COMPREHENSIVE METABOLIC PANEL
ALBUMIN: 3.5 g/dL (ref 3.5–5.0)
ALK PHOS: 97 U/L (ref 38–126)
ALT: 33 U/L (ref 17–63)
AST: 41 U/L (ref 15–41)
Anion gap: 4 — ABNORMAL LOW (ref 5–15)
BUN: 13 mg/dL (ref 6–20)
CALCIUM: 8.7 mg/dL — AB (ref 8.9–10.3)
CO2: 30 mmol/L (ref 22–32)
CREATININE: 0.74 mg/dL (ref 0.61–1.24)
Chloride: 105 mmol/L (ref 101–111)
GFR calc Af Amer: >60 mL/min (ref >60)
GFR calc non Af Amer: >60 mL/min (ref >60)
GLUCOSE: 100 mg/dL — AB (ref 65–99)
Potassium: 4.2 mmol/L (ref 3.5–5.1)
SODIUM: 139 mmol/L (ref 135–145)
Total Bilirubin: 0.9 mg/dL (ref 0.3–1.2)
Total Protein: 7.3 g/dL (ref 6.5–8.1)

## 2016-03-04 LAB — GLUCOSE, CAPILLARY
GLUCOSE-CAPILLARY: 93 mg/dL (ref 65–99)
Glucose-Capillary: 79 mg/dL (ref 65–99)
Glucose-Capillary: 116 mg/dL — ABNORMAL HIGH (ref 65–99)
Glucose-Capillary: 118 mg/dL — ABNORMAL HIGH (ref 65–99)

## 2016-03-04 LAB — CBC
HCT: 30.7 % — ABNORMAL LOW (ref 40.0–52.0)
Hemoglobin: 10 g/dL — ABNORMAL LOW (ref 13.0–18.0)
MCH: 28.2 pg (ref 26.0–34.0)
MCHC: 32.6 g/dL (ref 32.0–36.0)
MCV: 86.5 fL (ref 80.0–100.0)
PLATELETS: 120 10*3/uL — AB (ref 150–440)
RBC: 3.55 MIL/uL — ABNORMAL LOW (ref 4.40–5.90)
RDW: 15.7 % — ABNORMAL HIGH (ref 11.5–14.5)
WBC: 6 10*3/uL (ref 3.8–10.6)

## 2016-03-04 LAB — PROTIME-INR
INR: 1.27
PROTHROMBIN TIME: 16 s — AB (ref 11.4–15.0)

## 2016-03-04 LAB — AMMONIA: Ammonia: 93 umol/L — ABNORMAL HIGH (ref 9–35)

## 2016-03-04 NOTE — Progress Notes (Signed)
[facility] [place] [facility]   PATIENT NAME: Howard BowlerJoey [name]    MR#:  [id number]  DATE OF BIRTH:  1980/11/29  SUBJECTIVE:  CHIEF COMPLAINT:   Chief Complaint  Patient presents with  . Other   Feels better. Not confused. Still has some tremors. Weakness is improved. No vomiting or abdominal pain. No blood in stool.  REVIEW OF SYSTEMS:    Review of Systems  Constitutional: Positive for malaise/fatigue. Negative for fever and chills.  HENT: Negative for sore throat.   Eyes: Negative for blurred vision, double vision and pain.  Respiratory: Negative for cough, hemoptysis, shortness of breath and wheezing.   Cardiovascular: Negative for chest pain, palpitations, orthopnea and leg swelling.  Gastrointestinal: Positive for nausea. Negative for heartburn, vomiting, abdominal pain, diarrhea and constipation.  Genitourinary: Negative for dysuria and hematuria.  Musculoskeletal: Negative for back pain and joint pain.  Skin: Negative for rash.  Neurological: Positive for tremors and weakness. Negative for sensory change, speech change, focal weakness and headaches.  Endo/Heme/Allergies: Does not bruise/bleed easily.  Psychiatric/Behavioral: Positive for memory loss. Negative for depression. The patient is nervous/anxious.     DRUG ALLERGIES:  No Known Allergies  VITALS:  Blood pressure 130/74, pulse 63, temperature 98.3 F (36.8 C), temperature source Oral, resp. rate 18, height 6\' 3"  (1.905 m), weight 146.376 kg (322 lb 11.2 oz), SpO2 99 %.  PHYSICAL EXAMINATION:   Physical Exam  GENERAL:  35 y.o.-year-old patient lying in the bed with no acute distress.  EYES: Pupils equal, round, reactive to light and accommodation. No scleral icterus. Extraocular muscles intact.  HEENT: Head atraumatic, normocephalic. Oropharynx and nasopharynx clear.  NECK:  Supple, no jugular venous distention. No thyroid enlargement, no tenderness.  LUNGS: Normal breath sounds  bilaterally, no wheezing, rales, rhonchi. No use of accessory muscles of respiration.  CARDIOVASCULAR: S1, S2 normal. No murmurs, rubs, or gallops.  ABDOMEN: Soft, nontender, nondistended. Bowel sounds present. No organomegaly or mass.  EXTREMITIES: No cyanosis, clubbing or edema b/l.    NEUROLOGIC: Cranial nerves II through XII are intact. No focal Motor or sensory deficits b/l.Asterixis   PSYCHIATRIC: The patient is alert and oriented x 3. Anxious. SKIN: No obvious rash, lesion, or ulcer.   LABORATORY PANEL:   CBC  Recent Labs Lab 03/04/16 0409  WBC 6.0  HGB 10.0*  HCT 30.7*  PLT 120*   ------------------------------------------------------------------------------------------------------------------ Chemistries   Recent Labs Lab 03/04/16 0409  NA 139  K 4.2  CL 105  CO2 30  GLUCOSE 100*  BUN 13  CREATININE 0.74  CALCIUM 8.7*  AST 41  ALT 33  ALKPHOS 97  BILITOT 0.9   ------------------------------------------------------------------------------------------------------------------  Cardiac Enzymes No results for input(s): TROPONINI in the last 168 hours. ------------------------------------------------------------------------------------------------------------------  RADIOLOGY:  Dg Chest Portable 1 View  03/03/2016  CLINICAL DATA:  Upper abdominal pain. EXAM: PORTABLE CHEST 1 VIEW COMPARISON:  Radiograph of December 06, 2015. FINDINGS: The heart size and mediastinal contours are within normal limits. Both lungs are clear. No pneumothorax or pleural effusion is noted. The visualized skeletal structures are unremarkable. IMPRESSION: No acute cardiopulmonary abnormality seen. Electronically Signed   By: Lupita RaiderJames  Green Jr, M.D.   On: 03/03/2016 11:51     ASSESSMENT AND PLAN:   * Acute hepatic encephalopathy with ammonia level Slowly improving. Still significantly elevated at 93. Started lactulose 30 g 3 times a day. Repeat ammonia levels tomorrow.   Noncompliance could  be possible cause.  * Upper GI bleed? Some  blood and saliva. Could be from gums. No vomiting. Hemoglobin stable. Continue oral PPI.  * Diabetes mellitus sliding scale insulin. Diabetic diet.  * Hypertension Continue home medications along with Lasix and Aldactone.  * Chronic respiratory failure is stable. Continue oxygen at 2 L/min.  * Chronic mild thrombocytopenia is stable  All the records are reviewed and case discussed with Care Management/Social Workerr. Management plans discussed with the patient, family and they are in agreement.  CODE STATUS: FULL CODE  DVT Prophylaxis: SCDs  TOTAL TIME TAKING CARE OF THIS PATIENT: 30 minutes.   POSSIBLE D/C IN 1-2 DAYS, DEPENDING ON CLINICAL CONDITION.  Milagros Loll R M.D on 03/04/2016 at 11:03 AM  Between 7am to 6pm - Pager - [phone]  After 6pm go to [email or web address] - password [email or web address] [facility]  [place] [facility]  Office  [phone]  CC: Primary care physician; Marisue Ivan, MD  Note: This dictation was prepared with Dragon dictation along with smaller phrase technology. Any transcriptional errors that result from this process are unintentional.

## 2016-03-04 NOTE — Plan of Care (Signed)
Problem: Safety: Goal: Ability to remain free from injury will improve Outcome: Progressing Patient calls out for assistance before getting up

## 2016-03-05 LAB — GLUCOSE, CAPILLARY
GLUCOSE-CAPILLARY: 90 mg/dL (ref 65–99)
Glucose-Capillary: 78 mg/dL (ref 65–99)
Glucose-Capillary: 106 mg/dL — ABNORMAL HIGH (ref 65–99)
Glucose-Capillary: 125 mg/dL — ABNORMAL HIGH (ref 65–99)

## 2016-03-05 LAB — AMMONIA: Ammonia: 116 umol/L — ABNORMAL HIGH (ref 9–35)

## 2016-03-05 NOTE — Care Management Important Message (Signed)
Important Message  Patient Details  Name: Howard [name] MRN: [id number] Date of Birth: Jun 03, 1981   Medicare Important Message Given:  Yes    Olegario MessierKathy A Abelino Tippin 03/05/2016, 3:33 PM

## 2016-03-05 NOTE — Progress Notes (Signed)
[facility] [place] [facility]   PATIENT NAME: Howard BowlerJoey [name]    MR#:  [id number]  DATE OF BIRTH:  02-Mar-1981  SUBJECTIVE:  CHIEF COMPLAINT:   Chief Complaint  Patient presents with  . Other   Patient's mother at bedside today. Mentions he's been sleeping a lot. Not confused. Feels weak. No Vomiting or bleeding. Afebrile.   REVIEW OF SYSTEMS:    Review of Systems  Constitutional: Positive for malaise/fatigue. Negative for fever and chills.  HENT: Negative for sore throat.   Eyes: Negative for blurred vision, double vision and pain.  Respiratory: Negative for cough, hemoptysis, shortness of breath and wheezing.   Cardiovascular: Negative for chest pain, palpitations, orthopnea and leg swelling.  Gastrointestinal: Positive for nausea. Negative for heartburn, vomiting, abdominal pain, diarrhea and constipation.  Genitourinary: Negative for dysuria and hematuria.  Musculoskeletal: Negative for back pain and joint pain.  Skin: Negative for rash.  Neurological: Positive for tremors and weakness. Negative for sensory change, speech change, focal weakness and headaches.  Endo/Heme/Allergies: Does not bruise/bleed easily.  Psychiatric/Behavioral: Positive for memory loss. Negative for depression. The patient is nervous/anxious.     DRUG ALLERGIES:  No Known Allergies  VITALS:  Blood pressure 125/72, pulse 69, temperature 98.3 F (36.8 C), temperature source Oral, resp. rate 19, height 6\' 3"  (1.905 m), weight 104.917 kg (231 lb 4.8 oz), SpO2 99 %.  PHYSICAL EXAMINATION:   Physical Exam  GENERAL:  35 y.o.-year-old patient lying in the bed with no acute distress.  EYES: Pupils equal, round, reactive to light and accommodation. No scleral icterus. Extraocular muscles intact.  HEENT: Head atraumatic, normocephalic. Oropharynx and nasopharynx clear.  NECK:  Supple, no jugular venous distention. No thyroid enlargement, no tenderness.  LUNGS: Normal breath  sounds bilaterally, no wheezing, rales, rhonchi. No use of accessory muscles of respiration.  CARDIOVASCULAR: S1, S2 normal. No murmurs, rubs, or gallops.  ABDOMEN: Soft, nontender, nondistended. Bowel sounds present. No organomegaly or mass.  EXTREMITIES: No cyanosis, clubbing or edema b/l.    NEUROLOGIC: Cranial nerves II through XII are intact. No focal Motor or sensory deficits b/l.Asterixis   PSYCHIATRIC: The patient is alert and oriented x 3. Anxious. SKIN: No obvious rash, lesion, or ulcer.   LABORATORY PANEL:   CBC  Recent Labs Lab 03/04/16 0409  WBC 6.0  HGB 10.0*  HCT 30.7*  PLT 120*   ------------------------------------------------------------------------------------------------------------------ Chemistries   Recent Labs Lab 03/04/16 0409  NA 139  K 4.2  CL 105  CO2 30  GLUCOSE 100*  BUN 13  CREATININE 0.74  CALCIUM 8.7*  AST 41  ALT 33  ALKPHOS 97  BILITOT 0.9   ------------------------------------------------------------------------------------------------------------------  Cardiac Enzymes No results for input(s): TROPONINI in the last 168 hours. ------------------------------------------------------------------------------------------------------------------  RADIOLOGY:  No results found.   ASSESSMENT AND PLAN:   * Acute hepatic encephalopathy Worsening ammonia level today  lactulose 30 g 3 times a day. Repeat ammonia levels tomorrow.   Noncompliance could be possible cause.  * Upper GI bleed? Some blood and saliva. Could be from gums. No vomiting. Hemoglobin stable. Continue oral PPI.  * Diabetes mellitus sliding scale insulin. Diabetic diet.  * Hypertension Continue home medications along with Lasix and Aldactone.  * Chronic respiratory failure is stable. Continue oxygen at 2 L/min.  * Chronic mild thrombocytopenia is stable  All the records are reviewed and case discussed with Care Management/Social Workerr. Management plans  discussed with the patient, family and they are in agreement.  CODE STATUS: FULL CODE  DVT Prophylaxis: SCDs  TOTAL TIME TAKING CARE OF THIS PATIENT: 30 minutes.   POSSIBLE D/C IN 1-2 DAYS, DEPENDING ON CLINICAL CONDITION.  Milagros Loll R M.D on 03/05/2016 at 11:35 AM  Between 7am to 6pm - Pager - [phone]  After 6pm go to [email or web address] - password [email or web address]  [place] [facility]  Office  [phone]  CC: Primary care physician; Marisue Ivan, MD  Note: This dictation was prepared with Dragon dictation along with smaller phrase technology. Any transcriptional errors that result from this process are unintentional.

## 2016-03-05 NOTE — Evaluation (Signed)
Physical Therapy Evaluation Patient Details Name: Howard [name] MRN: [id number] DOB: 05/02/1981 Today's Date: 03/05/2016   History of Present Illness  Pt admitted for confusion, blood in saliva. He has a history of schizophrenia, DM, HLD, COPD, alcohol/liver cirrhosis.   Clinical Impression  Patient has been seen multiple times at this location recently, thus PT can compare to previous encounters. Patient reports he has been ambulating at home with no AD and no new falls. He has a history of using RW as he has had LE buckling in the past, however today he is able to ambulate community distances with no decline in O2 sats, no loss of balance without AD. He appears to be at his baseline with no acute PT needs identified. PT will sign off.     Follow Up Recommendations No PT follow up    Equipment Recommendations  None recommended by PT    Recommendations for Other Services       Precautions / Restrictions Precautions Precautions: None Restrictions Weight Bearing Restrictions: No      Mobility  Bed Mobility Overal bed mobility: Independent             General bed mobility comments: No deficits noted.   Transfers Overall transfer level: Independent Equipment used: None Transfers: Sit to/from Stand Sit to Stand: Independent         General transfer comment: No deficits noted, no loss of balance.   Ambulation/Gait Ambulation/Gait assistance: Independent Ambulation Distance (Feet): 420 Feet Assistive device: None Gait Pattern/deviations: WFL(Within Functional Limits)   Gait velocity interpretation: Below normal speed for age/gender General Gait Details: Patient ambulates with no AD, no buckling or drifting noted. No deficits identified from what patient reports as his baseline.   Stairs            Wheelchair Mobility    Modified Rankin (Stroke Patients Only)       Balance Overall balance assessment: Independent;History of Falls Sitting-balance  support: No upper extremity supported Sitting balance-Leahy Scale: Normal     Standing balance support: No upper extremity supported Standing balance-Leahy Scale: Good                               Pertinent Vitals/Pain Pain Assessment: No/denies pain    Home Living Family/patient expects to be discharged to:: Private residence Living Arrangements: Parent Available Help at Discharge: Family;Available 24 hours/day Type of Home: House Home Access: Stairs to enter Entrance Stairs-Rails: Can reach both Entrance Stairs-Number of Steps: 5 Home Layout: One level Home Equipment: Walker - 2 wheels;Cane - single point;Bedside commode      Prior Function Level of Independence: Independent         Comments: Pt reports ambulating household distances independently, no AD     Hand Dominance        Extremity/Trunk Assessment   Upper Extremity Assessment: Overall WFL for tasks assessed           Lower Extremity Assessment: Overall WFL for tasks assessed         Communication   Communication: No difficulties  Cognition Arousal/Alertness: Awake/alert Behavior During Therapy: WFL for tasks assessed/performed Overall Cognitive Status: History of cognitive impairments - at baseline                      General Comments      Exercises        Assessment/Plan    PT Assessment  Patent does not need any further PT services  PT Diagnosis     PT Problem List    PT Treatment Interventions     PT Goals (Current goals can be found in the Care Plan section) Acute Rehab PT Goals Patient Stated Goal: to get back home PT Goal Formulation: With patient Time For Goal Achievement: 03/19/16 Potential to Achieve Goals: Good    Frequency     Barriers to discharge        Co-evaluation               End of Session Equipment Utilized During Treatment: Gait belt Activity Tolerance: Patient tolerated treatment well Patient left: in bed;with call  bell/phone within reach;with bed alarm set Nurse Communication: Mobility status         Time: [id number] PT Time Calculation (min) (ACUTE ONLY): 10 min   Charges:   PT Evaluation $PT Eval Low Complexity: 1 Procedure     PT G Codes:       Kerin RansomPatrick A Yerik Zeringue, PT, DPT    03/05/2016, 4:35 PM

## 2016-03-06 LAB — GLUCOSE, CAPILLARY: Glucose-Capillary: 67 mg/dL (ref 65–99)

## 2016-03-06 LAB — AMMONIA: Ammonia: 51 umol/L — ABNORMAL HIGH (ref 9–35)

## 2016-03-06 NOTE — Discharge Instructions (Addendum)
°  DIET:  Cardiac diet  DISCHARGE CONDITION:  Stable  ACTIVITY:  Activity as tolerated  OXYGEN:  Home Oxygen: Yes.     Oxygen Delivery: 2 liters/min via Patient connected to nasal cannula oxygen  DISCHARGE LOCATION:  home   If you experience worsening of your admission symptoms, develop shortness of breath, life threatening emergency, suicidal or homicidal thoughts you must seek medical attention immediately by calling 911 or calling your MD immediately  if symptoms less severe.  You Must read complete instructions/literature along with all the possible adverse reactions/side effects for all the Medicines you take and that have been prescribed to you. Take any new Medicines after you have completely understood and accpet all the possible adverse reactions/side effects.   Please note  You were cared for by a hospitalist during your hospital stay. If you have any questions about your discharge medications or the care you received while you were in the hospital after you are discharged, you can call the unit and asked to speak with the hospitalist on call if the hospitalist that took care of you is not available. Once you are discharged, your primary care physician will handle any further medical issues. Please note that NO REFILLS for any discharge medications will be authorized once you are discharged, as it is imperative that you return to your primary care physician (or establish a relationship with a primary care physician if you do not have one) for your aftercare needs so that they can reassess your need for medications and monitor your lab values.   Resume home health as before

## 2016-03-06 NOTE — Care Management (Signed)
[facility] is aware that patient is discharging to home today. Case closed.

## 2016-03-06 NOTE — Progress Notes (Signed)
Discharge instructions reviewed with the patient.  No new Rx to be given.  Pt sent out via wheelchair with his own oxygen to the car

## 2016-03-08 NOTE — Discharge Summary (Signed)
[facility] [place] at [facility]   PATIENT NAME: Howard BowlerJoey [name]    MR#:  [id number]  DATE OF BIRTH:  Dec 23, 1980  DATE OF ADMISSION:  03/03/2016 ADMITTING PHYSICIAN: Milagros LollSrikar Ousmane Seeman, MD  DATE OF DISCHARGE: 03/06/2016 11:25 AM  PRIMARY CARE PHYSICIAN: Marisue IvanLINTHAVONG, [name], MD   ADMISSION DIAGNOSIS:  Hepatic encephalopathy (HCC) [K72.90] Chronic anemia [D64.9]  DISCHARGE DIAGNOSIS:  Active Problems:   Hepatic encephalopathy (HCC)   SECONDARY DIAGNOSIS:   Past Medical History  Diagnosis Date  . Hypertension   . Schizophrenia (HCC)   . Hyperlipemia   . Depression   . Diabetes (HCC)   . Liver disease   . Diabetes mellitus, type II (HCC)   . Heart disease   . Alcoholic cirrhosis of liver with ascites (HCC)   . Esophageal varices (HCC)   . Portal hypertensive gastropathy   . COPD (chronic obstructive pulmonary disease) (HCC)      ADMITTING HISTORY  Howard [name] is a 35 y.o. male with a known history of Alcoholic liver cirrhosis, hypertension, diabetes, chronic ascites presents to the emergency room due to confusion and some blood in his saliva. No frank blood in vomiting. No melena. He has been found to have ammonia level at 179. Patient mentions that he has been compliant with his medications including lactulose. Has 4-5 bowel movements daily. No shortness of breath or chest pain or abdominal pain. Stool checked for blood in the emergency room is negative for blood. Patient is poor historian. Most of the history obtained from mother at bedside and reviewing old records. Discussed with ER physician. Patient had EGD earlier this year which showed grade 1 esophageal varices.  HOSPITAL COURSE:   * Acute hepatic encephalopathy Ammonia level improved from 179-51. lactulose 30 g 3 times a day in the hospital. Twice daily at home. Continue rifaximin. Noncompliance could be possible cause.  * Upper GI bleed? Some blood and saliva. Could be from gums. No  vomiting. Hemoglobin stable. Continue oral PPI. Stool was negative for Hemoccult blood.  * Diabetes mellitus sliding scale insulin. Diabetic diet.  * Hypertension Continue home medications along with Lasix and Aldactone.  * Chronic respiratory failure is stable. Continue oxygen at 2 L/min.  * Chronic mild thrombocytopenia is stable  Stable for discharge home to follow-up with his primary care physician. Counseled to be compliant with medications. Home health services resumed.  CONSULTS OBTAINED:     DRUG ALLERGIES:  No Known Allergies  DISCHARGE MEDICATIONS:   Discharge Medication List as of 03/06/2016 10:45 AM    CONTINUE these medications which have NOT CHANGED   Details  albuterol (PROVENTIL HFA;VENTOLIN HFA) 108 (90 BASE) MCG/ACT inhaler Inhale 2 puffs into the lungs every 6 (six) hours as needed for wheezing or shortness of breath. Reported on 11/11/2015, Until Discontinued, Historical Med    aspirin EC 81 MG tablet Take 81 mg by mouth daily. , Until Discontinued, Historical Med    citalopram (CELEXA) 20 MG tablet Take 1 tablet (20 mg total) by mouth daily., Starting 01/15/2016, Until Discontinued, Normal    furosemide (LASIX) 20 MG tablet Take 1 tablet (20 mg total) by mouth 2 (two) times daily., Starting 02/12/2016, Until Discontinued, Normal    lactulose (CHRONULAC) 10 GM/15ML solution Take 45 mLs (30 g total) by mouth 2 (two) times daily., Starting 02/12/2016, Until Discontinued, Normal    losartan (COZAAR) 100 MG tablet Take 100 mg by mouth daily., Until Discontinued, Historical Med    metFORMIN (GLUCOPHAGE) 500 MG  tablet Take 1 tablet (500 mg total) by mouth 2 (two) times daily with a meal., Starting 11/13/2015, Until Discontinued, Normal    metoprolol tartrate (LOPRESSOR) 25 MG tablet Take 25 mg by mouth 2 (two) times daily., Until Discontinued, Historical Med    Multiple Vitamin (MULTIVITAMIN WITH MINERALS) TABS tablet Take 1 tablet by mouth daily., Starting  08/04/2015, Until Discontinued, Normal    OLANZapine (ZYPREXA) 15 MG tablet Take 1 tablet (15 mg total) by mouth at bedtime., Starting 01/15/2016, Until Discontinued, Normal    omeprazole (PRILOSEC) 40 MG capsule Take 1 capsule (40 mg total) by mouth every morning., Starting 02/12/2016, Until Discontinued, Normal    ondansetron (ZOFRAN) 4 MG tablet Take 1 tablet (4 mg total) by mouth daily as needed for nausea or vomiting., Starting 11/20/2015, Until Discontinued, Print    metoCLOPramide (REGLAN) 10 MG tablet Take 1 tablet (10 mg total) by mouth 3 (three) times daily before meals., Starting 11/05/2015, Until Tue 11/04/16, Print    nadolol (CORGARD) 40 MG tablet Take 1 tablet (40 mg total) by mouth daily., Starting 02/12/2016, Until Discontinued, Normal    rifaximin (XIFAXAN) 550 MG TABS tablet Take 1 tablet (550 mg total) by mouth 2 (two) times daily., Starting 02/12/2016, Until Discontinued, Normal    spironolactone (ALDACTONE) 25 MG tablet Take 1 tablet (25 mg total) by mouth daily., Starting 02/12/2016, Until Discontinued, Normal    traMADol (ULTRAM) 50 MG tablet Take 1 tablet (50 mg total) by mouth every 6 (six) hours as needed., Starting 11/18/2015, Until Mon 11/17/16, Print        Today   VITAL SIGNS:  Blood pressure 134/74, pulse 79, temperature 98.4 F (36.9 C), temperature source Oral, resp. rate 16, height 6\' 3"  (1.905 m), weight 104.917 kg (231 lb 4.8 oz), SpO2 96 %.  I/O:  No intake or output data in the 24 hours ending 03/08/16 1616  PHYSICAL EXAMINATION:  Physical Exam  GENERAL:  35 y.o.-year-old patient lying in the bed with no acute distress.  LUNGS: Normal breath sounds bilaterally, no wheezing, rales,rhonchi or crepitation. No use of accessory muscles of respiration.  CARDIOVASCULAR: S1, S2 normal. No murmurs, rubs, or gallops.  ABDOMEN: Soft, non-tender, non-distended. Bowel sounds present. No organomegaly or mass.  NEUROLOGIC: Moves all 4 extremities. PSYCHIATRIC: The  patient is alert and oriented x 3.  SKIN: No obvious rash, lesion, or ulcer.   DATA REVIEW:   CBC  Recent Labs Lab 03/04/16 0409  WBC 6.0  HGB 10.0*  HCT 30.7*  PLT 120*    Chemistries   Recent Labs Lab 03/04/16 0409  NA 139  K 4.2  CL 105  CO2 30  GLUCOSE 100*  BUN 13  CREATININE 0.74  CALCIUM 8.7*  AST 41  ALT 33  ALKPHOS 97  BILITOT 0.9    Cardiac Enzymes No results for input(s): TROPONINI in the last 168 hours.  Microbiology Results  Results for orders placed or performed during the hospital encounter of 03/03/16  Blood culture (routine x 2)     Status: None (Preliminary result)   Collection Time: 03/03/16  8:48 AM  Result Value Ref Range Status   Specimen Description BLOOD RIGHT AC  Final   Special Requests   Final    BOTTLES DRAWN AEROBIC AND ANAEROBIC AER ANA   Culture NO GROWTH 4 DAYS  Final   Report Status PENDING  Incomplete  Blood culture (routine x 2)     Status: None (Preliminary result)   Collection  Time: 03/03/16  8:48 AM  Result Value Ref Range Status   Specimen Description BLOOD LEFT HAND  Final   Special Requests BOTTLES DRAWN AEROBIC AND ANAEROBIC 11 ML  Final   Culture NO GROWTH 4 DAYS  Final   Report Status PENDING  Incomplete    RADIOLOGY:  No results found.  Follow up with PCP in 1 week.  Management plans discussed with the patient, family and they are in agreement.  CODE STATUS:  Code Status History    Date Active Date Inactive Code Status Order ID Comments User Context   03/03/2016 12:13 PM 03/06/2016  2:26 PM Full Code [id number]  Milagros Loll, MD ED   02/10/2016  5:54 PM 02/12/2016  7:34 PM Full Code [id number]  Ramonita Lab, MD Inpatient   11/11/2015  5:16 AM 11/13/2015  6:00 PM Full Code [id number]  Ihor Austin, MD Inpatient   11/07/2015 10:02 PM 11/09/2015  4:56 PM Full Code [id number]  Enid Baas, MD Inpatient   10/28/2015  8:14 PM 10/29/2015  2:26 PM Full Code [id number]  Altamese Dilling, MD Inpatient    10/16/2015 11:47 PM 10/17/2015  6:00 PM Full Code [id number]  Oralia Manis, MD Inpatient   07/30/2015  5:35 PM 08/04/2015  8:34 PM Full Code [id number]  Enedina Finner, MD Inpatient   07/05/2015  1:16 AM 07/08/2015  7:22 PM Full Code [id number]  Oralia Manis, MD Inpatient   05/30/2015  7:28 PM 06/01/2015  3:36 PM Full Code [id number]  Audery Amel, MD Inpatient      TOTAL TIME TAKING CARE OF THIS PATIENT ON DAY OF DISCHARGE: more than 30 minutes.   Milagros Loll R M.D on 03/08/2016 at 4:16 PM  Between 7am to 6pm - Pager - [phone]  After 6pm go to [email or web address] - password [email or web address] [facility]  [facility]  Office  [phone]  CC: Primary care physician; Marisue Ivan, MD  Note: This dictation was prepared with Dragon dictation along with smaller phrase technology. Any transcriptional errors that result from this process are unintentional.

## 2016-03-11 LAB — CULTURE, BLOOD (ROUTINE X 2)
CULTURE: NO GROWTH
CULTURE: NO GROWTH

## 2016-03-13 ENCOUNTER — Ambulatory Visit (INDEPENDENT_AMBULATORY_CARE_PROVIDER_SITE_OTHER): Payer: Medicare Other | Admitting: Psychiatry

## 2016-03-13 ENCOUNTER — Encounter: Payer: Self-pay | Admitting: Psychiatry

## 2016-03-13 VITALS — BP 138/74 | HR 80 | Temp 97.4°F | Ht 75.0 in | Wt 328.4 lb

## 2016-03-13 DIAGNOSIS — F200 Paranoid schizophrenia: Secondary | ICD-10-CM

## 2016-03-13 MED ORDER — OLANZAPINE 20 MG PO TABS: 20.0000 mg | ORAL_TABLET | Freq: Every day | ORAL | Status: DC

## 2016-03-14 ENCOUNTER — Inpatient Hospital Stay
Admission: EM | Admit: 2016-03-14 | Discharge: 2016-03-16 | DRG: 433 | Disposition: A | Discharge status: Home / Self Care | Payer: Medicare Other | Attending: Internal Medicine | Admitting: Internal Medicine

## 2016-03-14 DIAGNOSIS — Z811 Family history of alcohol abuse and dependence: Secondary | ICD-10-CM | POA: Diagnosis not present

## 2016-03-14 DIAGNOSIS — Z7984 Long term (current) use of oral hypoglycemic drugs: Secondary | ICD-10-CM

## 2016-03-14 DIAGNOSIS — Z7982 Long term (current) use of aspirin: Secondary | ICD-10-CM

## 2016-03-14 DIAGNOSIS — I10 Essential (primary) hypertension: Secondary | ICD-10-CM | POA: Diagnosis present

## 2016-03-14 DIAGNOSIS — Z823 Family history of stroke: Secondary | ICD-10-CM | POA: Diagnosis not present

## 2016-03-14 DIAGNOSIS — Z8249 Family history of ischemic heart disease and other diseases of the circulatory system: Secondary | ICD-10-CM | POA: Diagnosis not present

## 2016-03-14 DIAGNOSIS — I8500 Esophageal varices without bleeding: Secondary | ICD-10-CM | POA: Diagnosis present

## 2016-03-14 DIAGNOSIS — K7290 Hepatic failure, unspecified without coma: Secondary | ICD-10-CM | POA: Diagnosis present

## 2016-03-14 DIAGNOSIS — K7031 Alcoholic cirrhosis of liver with ascites: Principal | ICD-10-CM | POA: Diagnosis present

## 2016-03-14 DIAGNOSIS — K766 Portal hypertension: Secondary | ICD-10-CM | POA: Diagnosis present

## 2016-03-14 DIAGNOSIS — F209 Schizophrenia, unspecified: Secondary | ICD-10-CM | POA: Diagnosis present

## 2016-03-14 DIAGNOSIS — Z9981 Dependence on supplemental oxygen: Secondary | ICD-10-CM | POA: Diagnosis not present

## 2016-03-14 DIAGNOSIS — F1010 Alcohol abuse, uncomplicated: Secondary | ICD-10-CM | POA: Diagnosis present

## 2016-03-14 DIAGNOSIS — E785 Hyperlipidemia, unspecified: Secondary | ICD-10-CM | POA: Diagnosis present

## 2016-03-14 DIAGNOSIS — Z79899 Other long term (current) drug therapy: Secondary | ICD-10-CM | POA: Diagnosis not present

## 2016-03-14 DIAGNOSIS — E119 Type 2 diabetes mellitus without complications: Secondary | ICD-10-CM | POA: Diagnosis present

## 2016-03-14 DIAGNOSIS — J449 Chronic obstructive pulmonary disease, unspecified: Secondary | ICD-10-CM | POA: Diagnosis present

## 2016-03-14 DIAGNOSIS — K7682 Hepatic encephalopathy: Secondary | ICD-10-CM

## 2016-03-14 LAB — COMPREHENSIVE METABOLIC PANEL
ALK PHOS: 103 U/L (ref 38–126)
ALT: 40 U/L (ref 17–63)
AST: 45 U/L — AB (ref 15–41)
Albumin: 3.8 g/dL (ref 3.5–5.0)
Anion gap: 6 (ref 5–15)
BUN: 22 mg/dL — AB (ref 6–20)
CALCIUM: 9 mg/dL (ref 8.9–10.3)
CHLORIDE: 108 mmol/L (ref 101–111)
CO2: 25 mmol/L (ref 22–32)
CREATININE: 1.01 mg/dL (ref 0.61–1.24)
GFR calc Af Amer: >60 mL/min (ref >60)
GFR calc non Af Amer: >60 mL/min (ref >60)
GLUCOSE: 161 mg/dL — AB (ref 65–99)
Potassium: 4.8 mmol/L (ref 3.5–5.1)
SODIUM: 139 mmol/L (ref 135–145)
Total Bilirubin: 0.6 mg/dL (ref 0.3–1.2)
Total Protein: 7.6 g/dL (ref 6.5–8.1)

## 2016-03-14 LAB — URINALYSIS COMPLETE WITH MICROSCOPIC (ARMC ONLY)
Bacteria, UA: NONE SEEN
Bilirubin Urine: NEGATIVE
GLUCOSE, UA: NEGATIVE mg/dL
Ketones, ur: NEGATIVE mg/dL
Leukocytes, UA: NEGATIVE
Nitrite: NEGATIVE
Protein, ur: NEGATIVE mg/dL
SQUAMOUS EPITHELIAL / LPF: NONE SEEN
Specific Gravity, Urine: 1.02 (ref 1.005–1.030)
pH: 6 (ref 5.0–8.0)

## 2016-03-14 LAB — CBC
HCT: 29.8 % — ABNORMAL LOW (ref 40.0–52.0)
Hemoglobin: 10.2 g/dL — ABNORMAL LOW (ref 13.0–18.0)
MCH: 29.1 pg (ref 26.0–34.0)
MCHC: 34.1 g/dL (ref 32.0–36.0)
MCV: 85.1 fL (ref 80.0–100.0)
PLATELETS: 152 10*3/uL (ref 150–440)
RBC: 3.5 MIL/uL — ABNORMAL LOW (ref 4.40–5.90)
RDW: 15.9 % — AB (ref 11.5–14.5)
WBC: 8.1 10*3/uL (ref 3.8–10.6)

## 2016-03-14 LAB — MAGNESIUM: MAGNESIUM: 1.8 mg/dL (ref 1.7–2.4)

## 2016-03-14 LAB — GLUCOSE, CAPILLARY
GLUCOSE-CAPILLARY: 71 mg/dL (ref 65–99)
GLUCOSE-CAPILLARY: 88 mg/dL (ref 65–99)
Glucose-Capillary: 83 mg/dL (ref 65–99)

## 2016-03-14 LAB — PHOSPHORUS: Phosphorus: 2.9 mg/dL (ref 2.5–4.6)

## 2016-03-14 LAB — TROPONIN I

## 2016-03-14 LAB — LIPASE, BLOOD: LIPASE: 26 U/L (ref 11–51)

## 2016-03-14 LAB — AMMONIA: AMMONIA: 213 umol/L — AB (ref 9–35)

## 2016-03-14 MED ORDER — METOCLOPRAMIDE HCL 10 MG PO TABS
10.0000 mg | ORAL_TABLET | Freq: Three times a day (TID) | ORAL | Status: DC
  Administered 2016-03-14 – 2016-03-16 (×6): 10 mg via ORAL
  Filled 2016-03-14 (×6): qty 1

## 2016-03-14 MED ORDER — THIAMINE HCL 100 MG/ML IJ SOLN: 100.0000 mg | Freq: Every day | INTRAMUSCULAR | Status: DC

## 2016-03-14 MED ORDER — ADULT MULTIVITAMIN W/MINERALS CH: 1.0000 | ORAL_TABLET | Freq: Every day | ORAL | Status: DC

## 2016-03-14 MED ORDER — LOSARTAN POTASSIUM 50 MG PO TABS
100.0000 mg | ORAL_TABLET | Freq: Every day | ORAL | Status: DC
  Administered 2016-03-15 – 2016-03-16 (×2): 100 mg via ORAL
  Filled 2016-03-14 (×3): qty 2

## 2016-03-14 MED ORDER — ONDANSETRON HCL 4 MG PO TABS: 4.0000 mg | ORAL_TABLET | Freq: Four times a day (QID) | ORAL | Status: DC | PRN

## 2016-03-14 MED ORDER — LACTULOSE 10 GM/15ML PO SOLN
20.0000 g | Freq: Three times a day (TID) | ORAL | Status: DC
  Administered 2016-03-14 – 2016-03-16 (×6): 20 g via ORAL
  Filled 2016-03-14 (×6): qty 30

## 2016-03-14 MED ORDER — LORAZEPAM 1 MG PO TABS: 1.0000 mg | ORAL_TABLET | Freq: Four times a day (QID) | ORAL | Status: DC | PRN

## 2016-03-14 MED ORDER — ACETAMINOPHEN 325 MG PO TABS: 650.0000 mg | ORAL_TABLET | Freq: Four times a day (QID) | ORAL | Status: DC | PRN

## 2016-03-14 MED ORDER — OLANZAPINE 10 MG PO TABS
20.0000 mg | ORAL_TABLET | Freq: Every day | ORAL | Status: DC
  Administered 2016-03-14 – 2016-03-15 (×2): 20 mg via ORAL
  Filled 2016-03-14 (×2): qty 2

## 2016-03-14 MED ORDER — ONDANSETRON HCL 4 MG/2ML IJ SOLN: 4.0000 mg | Freq: Four times a day (QID) | INTRAMUSCULAR | Status: DC | PRN

## 2016-03-14 MED ORDER — ALBUTEROL SULFATE (2.5 MG/3ML) 0.083% IN NEBU: 2.5000 mg | INHALATION_SOLUTION | RESPIRATORY_TRACT | Status: DC | PRN

## 2016-03-14 MED ORDER — ACETAMINOPHEN 650 MG RE SUPP: 650.0000 mg | Freq: Four times a day (QID) | RECTAL | Status: DC | PRN

## 2016-03-14 MED ORDER — FOLIC ACID 1 MG PO TABS
1.0000 mg | ORAL_TABLET | Freq: Every day | ORAL | Status: DC
  Administered 2016-03-14 – 2016-03-16 (×3): 1 mg via ORAL
  Filled 2016-03-14 (×3): qty 1

## 2016-03-14 MED ORDER — ALBUTEROL SULFATE HFA 108 (90 BASE) MCG/ACT IN AERS: 2.0000 | INHALATION_SPRAY | Freq: Four times a day (QID) | RESPIRATORY_TRACT | Status: DC | PRN

## 2016-03-14 MED ORDER — METOPROLOL TARTRATE 25 MG PO TABS
25.0000 mg | ORAL_TABLET | Freq: Two times a day (BID) | ORAL | Status: DC
  Administered 2016-03-14 – 2016-03-16 (×4): 25 mg via ORAL
  Filled 2016-03-14 (×4): qty 1

## 2016-03-14 MED ORDER — FUROSEMIDE 20 MG PO TABS
20.0000 mg | ORAL_TABLET | Freq: Two times a day (BID) | ORAL | Status: DC
  Administered 2016-03-14 – 2016-03-16 (×4): 20 mg via ORAL
  Filled 2016-03-14 (×4): qty 1

## 2016-03-14 MED ORDER — TRAMADOL HCL 50 MG PO TABS: 50.0000 mg | ORAL_TABLET | Freq: Four times a day (QID) | ORAL | Status: DC | PRN

## 2016-03-14 MED ORDER — ADULT MULTIVITAMIN W/MINERALS CH
1.0000 | ORAL_TABLET | Freq: Every day | ORAL | Status: DC
  Administered 2016-03-14 – 2016-03-16 (×3): 1 via ORAL
  Filled 2016-03-14 (×3): qty 1

## 2016-03-14 MED ORDER — LORAZEPAM 2 MG/ML IJ SOLN: 1.0000 mg | Freq: Four times a day (QID) | INTRAMUSCULAR | Status: DC | PRN

## 2016-03-14 MED ORDER — NADOLOL 20 MG PO TABS
40.0000 mg | ORAL_TABLET | Freq: Every day | ORAL | Status: DC
  Administered 2016-03-15 – 2016-03-16 (×2): 40 mg via ORAL
  Filled 2016-03-14 (×2): qty 2

## 2016-03-14 MED ORDER — ENOXAPARIN SODIUM 40 MG/0.4ML ~~LOC~~ SOLN
40.0000 mg | SUBCUTANEOUS | Status: DC
  Administered 2016-03-14 – 2016-03-15 (×2): 40 mg via SUBCUTANEOUS
  Filled 2016-03-14 (×2): qty 0.4

## 2016-03-14 MED ORDER — LACTULOSE 10 GM/15ML PO SOLN
30.0000 g | Freq: Once | ORAL | Status: AC
  Administered 2016-03-14: 30 g via ORAL
  Filled 2016-03-14: qty 60

## 2016-03-14 MED ORDER — SPIRONOLACTONE 25 MG PO TABS
25.0000 mg | ORAL_TABLET | Freq: Every day | ORAL | Status: DC
  Administered 2016-03-15 – 2016-03-16 (×2): 25 mg via ORAL
  Filled 2016-03-14 (×3): qty 1

## 2016-03-14 MED ORDER — INSULIN ASPART 100 UNIT/ML ~~LOC~~ SOLN: 0.0000 [IU] | Freq: Every day | SUBCUTANEOUS | Status: DC

## 2016-03-14 MED ORDER — CITALOPRAM HYDROBROMIDE 20 MG PO TABS
20.0000 mg | ORAL_TABLET | Freq: Every day | ORAL | Status: DC
  Administered 2016-03-14 – 2016-03-15 (×2): 20 mg via ORAL
  Filled 2016-03-14 (×3): qty 1

## 2016-03-14 MED ORDER — INSULIN ASPART 100 UNIT/ML ~~LOC~~ SOLN
0.0000 [IU] | Freq: Three times a day (TID) | SUBCUTANEOUS | Status: DC
  Administered 2016-03-15 – 2016-03-16 (×2): 1 [IU] via SUBCUTANEOUS
  Filled 2016-03-14 (×2): qty 1

## 2016-03-14 MED ORDER — VITAMIN B-1 100 MG PO TABS
100.0000 mg | ORAL_TABLET | Freq: Every day | ORAL | Status: DC
  Administered 2016-03-14 – 2016-03-16 (×3): 100 mg via ORAL
  Filled 2016-03-14 (×4): qty 1

## 2016-03-14 MED ORDER — ASPIRIN EC 81 MG PO TBEC
81.0000 mg | DELAYED_RELEASE_TABLET | Freq: Every day | ORAL | Status: DC
Start: 2016-03-15 — End: 2016-03-16
  Administered 2016-03-15 – 2016-03-16 (×2): 81 mg via ORAL
  Filled 2016-03-14 (×2): qty 1

## 2016-03-14 MED ORDER — RIFAXIMIN 550 MG PO TABS
550.0000 mg | ORAL_TABLET | Freq: Two times a day (BID) | ORAL | Status: DC
  Administered 2016-03-14 – 2016-03-16 (×4): 550 mg via ORAL
  Filled 2016-03-14 (×5): qty 1

## 2016-03-14 MED ORDER — PANTOPRAZOLE SODIUM 40 MG PO TBEC
40.0000 mg | DELAYED_RELEASE_TABLET | Freq: Every day | ORAL | Status: DC
  Administered 2016-03-14 – 2016-03-16 (×3): 40 mg via ORAL
  Filled 2016-03-14 (×3): qty 1

## 2016-03-14 MED ORDER — SODIUM CHLORIDE 0.9 % IV SOLN
INTRAVENOUS | Status: DC
  Administered 2016-03-14 – 2016-03-16 (×4): via INTRAVENOUS

## 2016-03-14 NOTE — ED Notes (Addendum)
Pt arrives to ER via POV c/o right sided abdominal pain, "my lives enzymes are probably up", and CP X 2 days. Pt hx of cirrhosis of liver. Pt arrives on oxygen, wears 2L [place] chronically. Mild confusion per family.

## 2016-03-14 NOTE — ED Provider Notes (Signed)
[facility] Emergency Department Provider Note   ____________________________________________  Time seen: Approximately 10:50 AM I have reviewed the triage vital signs and the triage nursing note.  HISTORY  Chief Complaint Abdominal Pain; Elevated Hepatic Enzymes; and Chest Pain   Historian Limited history by Patient due to confusion Mother provided some history two  HPI Howard [name] is a 35 y.o. male  with history of schizophrenia, alcohol abuse, and alcoholic cirrhosis who is currently on daily lactulose, and is presenting for confusion and shaking of his hands, and he thinks that his enzymes are up. Also complaining of some right-sided upper quadrant abdominal pain which is sharp and intermittent. He is not sure if he has ever felt like something like that before. Denies urinary symptoms. Denies constipation or diarrhea.  At present he is having minimal pain in the right upper quadrant.    Past Medical History  Diagnosis Date  . Hypertension   . Schizophrenia (HCC)   . Hyperlipemia   . Depression   . Diabetes (HCC)   . Liver disease   . Diabetes mellitus, type II (HCC)   . Heart disease   . Alcoholic cirrhosis of liver with ascites (HCC)   . Esophageal varices (HCC)   . Portal hypertensive gastropathy   . COPD (chronic obstructive pulmonary disease) [facility](HCC)     Patient Active Problem List   Diagnosis Date Noted  . Controlled type 2 diabetes mellitus without complication (HCC) 01/15/2016  . Normocytic anemia 12/12/2015  . Essential (primary) hypertension 12/11/2015  . Pure hypercholesterolemia 12/11/2015  . Fever 11/11/2015  . Ascites due to alcoholic cirrhosis (HCC) 11/11/2015  . Cirrhosis of liver with ascites (HCC) 11/11/2015  . Hepatic encephalopathy (HCC) 10/16/2015  . Type 2 diabetes mellitus (HCC) 10/16/2015  . Acute renal failure (ARF) (HCC) 07/30/2015  . 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 Alcoholic  cirrhosis (HCC) 07/19/2015  . Hypoxia 07/04/2015  . Elevated transaminase level 07/04/2015  . DOE (dyspnea on exertion) 07/04/2015  . Alcohol abuse 05/31/2015  . Hypertension 05/29/2015  . Paranoid schizophrenia (HCC) 03/20/2015    Past Surgical History  Procedure Laterality Date  . No past surgeries    . Esophagogastroduodenoscopy (egd) with propofol N/A 08/03/2015    Procedure: ESOPHAGOGASTRODUODENOSCOPY (EGD) WITH PROPOFOL;  Surgeon: Elnita MaxwellMatthew Gordon Rein, MD;  [facility];  Service: Endoscopy;  Laterality: N/A;  . Esophagogastroduodenoscopy (egd) with propofol N/A 08/31/2015    Procedure: ESOPHAGOGASTRODUODENOSCOPY (EGD) WITH PROPOFOL;  Surgeon: Elnita MaxwellMatthew Gordon Rein, MD;  [facility];  Service: Endoscopy;  Laterality: N/A;  . Esophagogastroduodenoscopy N/A 10/05/2015    Procedure: ESOPHAGOGASTRODUODENOSCOPY (EGD);  Surgeon: Elnita MaxwellMatthew Gordon Rein, MD;  [facility];  Service: Endoscopy;  Laterality: N/A;    Current Outpatient Rx  Name  Route  Sig  Dispense  Refill  . albuterol (PROVENTIL HFA;VENTOLIN HFA) 108 (90 BASE) MCG/ACT inhaler   Inhalation   Inhale 2 puffs into the lungs every 6 (six) hours as needed for wheezing or shortness of breath. Reported on 11/11/2015         . aspirin EC 81 MG tablet   Oral   Take 81 mg by mouth daily.          . citalopram (CELEXA) 20 MG tablet   Oral   Take 1 tablet (20 mg total) by mouth daily.   90 tablet   1   . furosemide (LASIX) 20 MG tablet   Oral   Take 1 tablet (20  mg total) by mouth 2 (two) times daily.   30 tablet   2   . lactulose (CHRONULAC) 10 GM/15ML solution   Oral   Take 45 mLs (30 g total) by mouth 2 (two) times daily.   236 mL   5   . losartan (COZAAR) 100 MG tablet   Oral   Take 100 mg by mouth daily.         . metFORMIN (GLUCOPHAGE) 500 MG tablet   Oral   Take 1 tablet (500 mg total) by mouth 2 (two) times daily with a meal.   30 tablet   0   . metoCLOPramide  (REGLAN) 10 MG tablet   Oral   Take 1 tablet (10 mg total) by mouth 3 (three) times daily before meals.   20 tablet   0   . metoprolol tartrate (LOPRESSOR) 25 MG tablet   Oral   Take 25 mg by mouth 2 (two) times daily.         . Multiple Vitamin (MULTIVITAMIN WITH MINERALS) TABS tablet   Oral   Take 1 tablet by mouth daily.   30 tablet   0   . nadolol (CORGARD) 40 MG tablet   Oral   Take 1 tablet (40 mg total) by mouth daily.   30 tablet   2   . OLANZapine (ZYPREXA) 20 MG tablet   Oral   Take 1 tablet (20 mg total) by mouth at bedtime.   90 tablet   1   . omeprazole (PRILOSEC) 40 MG capsule   Oral   Take 1 capsule (40 mg total) by mouth every morning.   30 capsule   2   . ondansetron (ZOFRAN) 4 MG tablet   Oral   Take 1 tablet (4 mg total) by mouth daily as needed for nausea or vomiting.   20 tablet   1   . rifaximin (XIFAXAN) 550 MG TABS tablet   Oral   Take 1 tablet (550 mg total) by mouth 2 (two) times daily.   60 tablet   2   . spironolactone (ALDACTONE) 25 MG tablet   Oral   Take 1 tablet (25 mg total) by mouth daily.   30 tablet   2   . traMADol (ULTRAM) 50 MG tablet   Oral   Take 1 tablet (50 mg total) by mouth every 6 (six) hours as needed.   20 tablet   0     Allergies Review of patient's allergies indicates no known allergies.  Family History  Problem Relation Age of Onset  . Heart disease Mother   . Hypertension Mother   . Hyperlipidemia Mother   . Stroke Father   . Heart attack Father   . Hypertension Father   . Heart disease Father   . Alcohol abuse Father   . Heart disease Brother     Social History Social History  Substance Use Topics  . Smoking status: Never Smoker   . Smokeless tobacco: Never Used  . Alcohol Use: 0.0 oz/week    0 Shots of liquor, 0 Standard drinks or equivalent per week     Comment: last drink 02/09/16    Review of Systems  Constitutional: Negative for fever. Eyes: Negative for visual  changes. ENT: Negative for sore throat. Cardiovascular: Negative for chest pain. Respiratory: Negative for shortness of breath. Gastrointestinal: As per history of present illness. Genitourinary: Negative for dysuria. Musculoskeletal: Negative for back pain. Skin: Negative for rash. Neurological: Negative for headache.  10 point Review of Systems otherwise negative ____________________________________________   PHYSICAL EXAM:  VITAL SIGNS: ED Triage Vitals  Enc Vitals Group     BP 03/14/16 0936 137/83 mmHg     Pulse Rate 03/14/16 0936 90     Resp 03/14/16 0936 22     Temp 03/14/16 0936 98.2 F (36.8 C)     Temp Source 03/14/16 0936 Oral     SpO2 03/14/16 0936 99 %     Weight 03/14/16 0936 328 lb (148.78 kg)     Height 03/14/16 0936 6\' 3"  (1.905 m)     Head Cir --      Peak Flow --      Pain Score --      Pain Loc --      Pain Edu? --      Excl. in GC? --      Constitutional: Alert and cooperative but confused. In no acute distress.Marland Kitchen. HEENT   Head: Normocephalic and atraumatic.      Eyes: Conjunctivae are normal. PERRL. Normal extraocular movements.      Ears:         Nose: No congestion/rhinnorhea.   Mouth/Throat: Mucous membranes are moist.   Neck: No stridor. Cardiovascular/Chest: Normal rate, regular rhythm.  No murmurs, rubs, or gallops. Respiratory: Normal respiratory effort without tachypnea nor retractions. Breath sounds are clear and equal bilaterally. No wheezes/rales/rhonchi. Gastrointestinal: Soft. Distended and large liver, mildly tender to palpation in the right side upper abdomen. No guarding or rebound.  Genitourinary/rectal:Deferred Musculoskeletal: Nontender with normal range of motion in all extremities. No joint effusions.  No lower extremity tenderness.  No edema. Neurologic:  Somewhat poor historian/confused, but no slurred speech. No facial droop. No gross or focal neurologic deficits are appreciated. Asterixis bilaterally Skin:  Skin is  warm, dry and intact. No rash noted. Psychiatric: Flat affect, no hallucinations.  ____________________________________________   EKG I, Howard Rooksebecca Mohannad Olivero, MD, the attending physician have personally viewed and interpreted all ECGs.  ADM beats per minute. Normal sinus rhythm. Narrow QRS. Normal axis. Normal ST and T-wave ____________________________________________  LABS (pertinent positives/negatives)  Labs Reviewed  COMPREHENSIVE METABOLIC PANEL - Abnormal; Notable for the following:    Glucose, Bld 161 (*)    BUN 22 (*)    AST 45 (*)    All other components within normal limits  CBC - Abnormal; Notable for the following:    RBC 3.50 (*)    Hemoglobin 10.2 (*)    HCT 29.8 (*)    RDW 15.9 (*)    All other components within normal limits  URINALYSIS COMPLETEWITH MICROSCOPIC ([facility] ONLY) - Abnormal; Notable for the following:    Color, Urine YELLOW (*)    APPearance CLEAR (*)    Hgb urine dipstick 2+ (*)    All other components within normal limits  AMMONIA - Abnormal; Notable for the following:    Ammonia 213 (*)    All other components within normal limits  LIPASE, BLOOD  TROPONIN I    ____________________________________________  RADIOLOGY All Xrays were viewed by me. Imaging interpreted by Radiologist.  None __________________________________________  PROCEDURES  Procedure(s) performed: None  Critical Care performed: None  ____________________________________________   ED COURSE / ASSESSMENT AND PLAN  Pertinent labs & imaging results that were available during my care of the patient were reviewed by me and considered in my medical decision making (see chart for details).   Patient is here with right upper quadrant abdominal pain , confusion and asterixis and  his ammonia is elevated to 213. He was recently admitted in the hospital for hepatic encephalopathy at that time his ammonia was around 170 and came down to 50 during his stay.  And terms of the  abdominal pain, his laboratory studies are reassuring in terms of normal lipase, no acute renal failure, let her lites reassuring, normal bili, no elevated white blood cell count, chronic anemia, and red blood cells in the urine without signs of infection. At this point I am not clinically concerned about SBP, nor other intra-abdominal surgical or medical emergency. I think he is having pain related to his cirrhosis.  In terms of the hepatic encephalopathy, I will discuss with the hospitalist for admission. Lactulose initiated.    CONSULTATIONS:   Hospitalist for admission.   Patient / Family / Caregiver informed of clinical course, medical decision-making process, and agree with plan.   ___________________________________________   FINAL CLINICAL IMPRESSION(S) / ED DIAGNOSES   Final diagnoses:  Hepatic encephalopathy (HCC)              Note: This dictation was prepared with Dragon dictation. Any transcriptional errors that result from this process are unintentional   Howard Rooks, MD 03/14/16 [phone]

## 2016-03-14 NOTE — H&P (Signed)
[facility] [place] [facility]   PATIENT NAME: Howard [name]    MR#:  [id number]  DATE OF BIRTH:  Jun 29, 1981  DATE OF ADMISSION:  03/14/2016  PRIMARY CARE PHYSICIAN: Marisue IvanLINTHAVONG, [name], MD   REQUESTING/REFERRING PHYSICIAN: Governor Rooksebecca Lord, MD  CHIEF COMPLAINT:   Chief Complaint  Patient presents with  . Abdominal Pain  . Elevated Hepatic Enzymes  . Chest Pain   Confusion today. HISTORY OF PRESENT ILLNESS:  Howard [name]  is a 35 y.o. male with a known history of Hepatitic encephalopathy, liver cirrhosis, hypertension and diabetes. The patient was sent to the ED due to confusion today. He has a history of liver cirrhosis and is on lactulose at home. He was just discharged from our hospital to home one week ago after treatment of hepatitic encephalopathy. He was found elevated ammonia level at 213. The patient looks confused and complains of right-sided lower chest pain. He is treated with lactulose just now. He said he is still drinking alcohol. The last dose was yesterday.  PAST MEDICAL HISTORY:   Past Medical History  Diagnosis Date  . Hypertension   . Schizophrenia (HCC)   . Hyperlipemia   . Depression   . Diabetes (HCC)   . Liver disease   . Diabetes mellitus, type II (HCC)   . Heart disease   . Alcoholic cirrhosis of liver with ascites (HCC)   . Esophageal varices (HCC)   . Portal hypertensive gastropathy   . COPD (chronic obstructive pulmonary disease) (HCC)     PAST SURGICAL HISTORY:   Past Surgical History  Procedure Laterality Date  . No past surgeries    . Esophagogastroduodenoscopy (egd) with propofol N/A 08/03/2015    Procedure: ESOPHAGOGASTRODUODENOSCOPY (EGD) WITH PROPOFOL;  Surgeon: Elnita MaxwellMatthew Gordon Rein, MD;  [facility];  Service: Endoscopy;  Laterality: N/A;  . Esophagogastroduodenoscopy (egd) with propofol N/A 08/31/2015    Procedure: ESOPHAGOGASTRODUODENOSCOPY (EGD) WITH PROPOFOL;  Surgeon: Elnita MaxwellMatthew Gordon Rein, MD;   [facility];  Service: Endoscopy;  Laterality: N/A;  . Esophagogastroduodenoscopy N/A 10/05/2015    Procedure: ESOPHAGOGASTRODUODENOSCOPY (EGD);  Surgeon: Elnita MaxwellMatthew Gordon Rein, MD;  [facility];  Service: Endoscopy;  Laterality: N/A;    SOCIAL HISTORY:   Social History  Substance Use Topics  . Smoking status: Never Smoker   . Smokeless tobacco: Never Used  . Alcohol Use: 0.0 oz/week    0 Shots of liquor, 0 Standard drinks or equivalent per week     Comment: last drink 02/09/16    FAMILY HISTORY:   Family History  Problem Relation Age of Onset  . Heart disease Mother   . Hypertension Mother   . Hyperlipidemia Mother   . Stroke Father   . Heart attack Father   . Hypertension Father   . Heart disease Father   . Alcohol abuse Father   . Heart disease Brother     DRUG ALLERGIES:  No Known Allergies  REVIEW OF SYSTEMS:  CONSTITUTIONAL: No fever, Has generalized weakness.  EYES: No blurred or double vision.  EARS, NOSE, AND THROAT: No tinnitus or ear pain.  RESPIRATORY: No cough, shortness of breath, wheezing or hemoptysis.  CARDIOVASCULAR: His right lower side chest pain, no orthopnea, edema.  GASTROINTESTINAL: No nausea, vomiting,  or abdominal pain. But has diarrhea. No melena or bloody stool. GENITOURINARY: No dysuria, hematuria.  ENDOCRINE: No polyuria, nocturia,  HEMATOLOGY: No anemia, easy bruising or bleeding SKIN: No rash or lesion. MUSCULOSKELETAL: No joint pain or arthritis.  NEUROLOGIC: No tingling, numbness, weakness.  PSYCHIATRY: No anxiety or depression.   MEDICATIONS AT HOME:   Prior to Admission medications   Medication Sig Start Date End Date Taking? Authorizing Provider  albuterol (PROVENTIL HFA;VENTOLIN HFA) 108 (90 BASE) MCG/ACT inhaler Inhale 2 puffs into the lungs every 6 (six) hours as needed for wheezing or shortness of breath. Reported on 11/11/2015   Yes Historical Provider, MD  aspirin EC 81 MG tablet Take 81 mg by  mouth daily.    Yes Historical Provider, MD  citalopram (CELEXA) 20 MG tablet Take 1 tablet (20 mg total) by mouth daily. 01/15/16  Yes Audery AmelJohn T [name], MD  furosemide (LASIX) 20 MG tablet Take 1 tablet (20 mg total) by mouth 2 (two) times daily. 02/12/16  Yes Enid Baasadhika Kalisetti, MD  lactulose (CHRONULAC) 10 GM/15ML solution Take 45 mLs (30 g total) by mouth 2 (two) times daily. 02/12/16  Yes Enid Baasadhika Kalisetti, MD  losartan (COZAAR) 100 MG tablet Take 100 mg by mouth daily.   Yes Historical Provider, MD  metFORMIN (GLUCOPHAGE) 500 MG tablet Take 1 tablet (500 mg total) by mouth 2 (two) times daily with a meal. 11/13/15  Yes Katha HammingSnehalatha Konidena, MD  metoCLOPramide (REGLAN) 10 MG tablet Take 1 tablet (10 mg total) by mouth 3 (three) times daily before meals. 11/05/15 11/04/16 Yes Rebecka ApleyAllison P Webster, MD  metoprolol tartrate (LOPRESSOR) 25 MG tablet Take 25 mg by mouth 2 (two) times daily.   Yes Historical Provider, MD  Multiple Vitamin (MULTIVITAMIN WITH MINERALS) TABS tablet Take 1 tablet by mouth daily. 08/04/15  Yes Ramonita LabAruna Gouru, MD  nadolol (CORGARD) 40 MG tablet Take 1 tablet (40 mg total) by mouth daily. 02/12/16  Yes Enid Baasadhika Kalisetti, MD  OLANZapine (ZYPREXA) 20 MG tablet Take 1 tablet (20 mg total) by mouth at bedtime. 03/13/16  Yes Audery AmelJohn T [name], MD  omeprazole (PRILOSEC) 40 MG capsule Take 1 capsule (40 mg total) by mouth every morning. 02/12/16  Yes Enid Baasadhika Kalisetti, MD  ondansetron (ZOFRAN) 4 MG tablet Take 1 tablet (4 mg total) by mouth daily as needed for nausea or vomiting. 11/20/15  Yes Jene Everyobert Kinner, MD  rifaximin (XIFAXAN) 550 MG TABS tablet Take 1 tablet (550 mg total) by mouth 2 (two) times daily. 02/12/16  Yes Enid Baasadhika Kalisetti, MD  spironolactone (ALDACTONE) 25 MG tablet Take 1 tablet (25 mg total) by mouth daily. 02/12/16  Yes Enid Baasadhika Kalisetti, MD      VITAL SIGNS:  Blood pressure 121/61, pulse 73, temperature 98.2 F (36.8 C), temperature source Oral, resp. rate 21, height 6\' 3"  (1.905  m), weight 328 lb (148.78 kg), SpO2 95 %.  PHYSICAL EXAMINATION:  GENERAL:  35 y.o.-year-old patient lying in the bed with no acute distress. Morbidly obese. EYES: Pupils equal, round, reactive to light and accommodation. No scleral icterus. Extraocular muscles intact.  HEENT: Head atraumatic, normocephalic. Oropharynx and nasopharynx clear.  NECK:  Supple, no jugular venous distention. No thyroid enlargement, no tenderness.  LUNGS: Normal breath sounds bilaterally, no wheezing, rales,rhonchi or crepitation. No use of accessory muscles of respiration.  CARDIOVASCULAR: S1, S2 normal. No murmurs, rubs, or gallops.  ABDOMEN: Soft, mild tenderness in middle part of abdomen, nondistended. Bowel sounds present. No organomegaly or mass.  EXTREMITIES: No pedal edema, cyanosis, or clubbing.  NEUROLOGIC: Cranial nerves II through XII are intact. Muscle strength 5/5 in all extremities. Sensation intact. Gait not checked.  PSYCHIATRIC: The patient is alert and oriented x 3.  SKIN: No obvious rash, lesion, or ulcer.  LABORATORY PANEL:   CBC  Recent Labs Lab 03/14/16 0940  WBC 8.1  HGB 10.2*  HCT 29.8*  PLT 152   ------------------------------------------------------------------------------------------------------------------  Chemistries   Recent Labs Lab 03/14/16 0940  NA 139  K 4.8  CL 108  CO2 25  GLUCOSE 161*  BUN 22*  CREATININE 1.01  CALCIUM 9.0  AST 45*  ALT 40  ALKPHOS 103  BILITOT 0.6   ------------------------------------------------------------------------------------------------------------------  Cardiac Enzymes  Recent Labs Lab 03/14/16 0940  TROPONINI <0.03   ------------------------------------------------------------------------------------------------------------------  RADIOLOGY:  No results found.  EKG:   Orders placed or performed during the hospital encounter of 03/14/16  . ED EKG  . ED EKG    IMPRESSION AND PLAN:   Acute hepatitic  encephalopathy. The patient will be admitted to medical floor. I will increase lactulose to 3 times a day, continue rifaximin and the spironolactone, follow-up ammonia level. Aspiration and fall precaution.  Hypertension. Continue hypertension medication.  Diabetes. Start sliding scale.  COPD. Stable. Continue nebulizer when necessary.  Alcohol abuse. Start CIWA protocol.  All the records are reviewed and case discussed with ED provider. Management plans discussed with the patient, his mother and they are in agreement.  CODE STATUS: Full code  TOTAL TIME TAKING CARE OF THIS PATIENT: 53  minutes.    Shaune Pollack M.D on 03/14/2016 at 11:43 AM  Between 7am to 6pm - Pager - [phone]  After 6pm go to [email or web address] - password [email or web address] [facility]  [facility]  Office  [phone]  CC: Primary care physician; Marisue Ivan, MD

## 2016-03-15 LAB — BASIC METABOLIC PANEL WITH GFR
Anion gap: 7 (ref 5–15)
BUN: 18 mg/dL (ref 6–20)
CO2: 27 mmol/L (ref 22–32)
Calcium: 8.9 mg/dL (ref 8.9–10.3)
Chloride: 107 mmol/L (ref 101–111)
Creatinine, Ser: 0.87 mg/dL (ref 0.61–1.24)
GFR calc Af Amer: >60 mL/min
GFR calc non Af Amer: >60 mL/min
Glucose, Bld: 76 mg/dL (ref 65–99)
Potassium: 4.1 mmol/L (ref 3.5–5.1)
Sodium: 141 mmol/L (ref 135–145)

## 2016-03-15 LAB — CBC
HCT: 28.4 % — ABNORMAL LOW (ref 40.0–52.0)
Hemoglobin: 9.5 g/dL — ABNORMAL LOW (ref 13.0–18.0)
MCH: 28.4 pg (ref 26.0–34.0)
MCHC: 33.3 g/dL (ref 32.0–36.0)
MCV: 85.5 fL (ref 80.0–100.0)
Platelets: 117 10*3/uL — ABNORMAL LOW (ref 150–440)
RBC: 3.32 MIL/uL — ABNORMAL LOW (ref 4.40–5.90)
RDW: 16 % — ABNORMAL HIGH (ref 11.5–14.5)
WBC: 5.8 10*3/uL (ref 3.8–10.6)

## 2016-03-15 LAB — GLUCOSE, CAPILLARY
Glucose-Capillary: 85 mg/dL (ref 65–99)
Glucose-Capillary: 130 mg/dL — ABNORMAL HIGH (ref 65–99)
Glucose-Capillary: 103 mg/dL — ABNORMAL HIGH (ref 65–99)
Glucose-Capillary: 68 mg/dL (ref 65–99)

## 2016-03-15 LAB — AMMONIA: Ammonia: 94 umol/L — ABNORMAL HIGH (ref 9–35)

## 2016-03-15 NOTE — Progress Notes (Signed)
[facility] [place] [facility]                                                                                                                                                                                            Patient Demographics   Howard [name], is a 35 y.o. male, DOB - [birth year], ZOX:[id number]  Admit date - 03/14/2016   Admitting Physician Shaune PollackQing Chen, MD  Outpatient Primary MD for the patient is Marisue IvanLINTHAVONG, [name], MD   LOS - 1  Subjective: Patient admitted with confusion and hepatic encephalopathy his ammonia level is less he is more awake. Denies any complaints    Review of Systems:   CONSTITUTIONAL: No documented fever. No fatigue, weakness. No weight gain, no weight loss.  EYES: No blurry or double vision.  ENT: No tinnitus. No postnasal drip. No redness of the oropharynx.  RESPIRATORY: No cough, no wheeze, no hemoptysis. No dyspnea.  CARDIOVASCULAR: No chest pain. No orthopnea. No palpitations. No syncope.  GASTROINTESTINAL: No nausea, no vomiting or diarrhea. No abdominal pain. No melena or hematochezia.  GENITOURINARY: No dysuria or hematuria.  ENDOCRINE: No polyuria or nocturia. No heat or cold intolerance.  HEMATOLOGY: No anemia. No bruising. No bleeding.  INTEGUMENTARY: No rashes. No lesions.  MUSCULOSKELETAL: No arthritis. No swelling. No gout.  NEUROLOGIC: No numbness, tingling, or ataxia. No seizure-type activity.  PSYCHIATRIC: No anxiety. No insomnia. No ADD.    Vitals:   Filed Vitals:   03/14/16 1705 03/14/16 2044 03/15/16 0521 03/15/16 1151  BP:  115/55 132/67 142/84  Pulse: 72 73 55 60  Temp:  98.2 F (36.8 C) 97.6 F (36.4 C) 97.6 F (36.4 C)  TempSrc:  Oral Oral Oral  Resp:  16 16 18   Height:      Weight:      SpO2:  98% 98% 100%    Wt Readings from Last 3 Encounters:  03/14/16 146.376 kg (322 lb 11.2 oz)  03/13/16 148.961 kg (328 lb 6.4 oz)  03/05/16 104.917 kg (231 lb 4.8 oz)     Intake/Output Summary  (Last 24 hours) at 03/15/16 1156 Last data filed at 03/15/16 0900  Gross per 24 hour  Intake    697 ml  Output   2175 ml  Net  -1478 ml    Physical Exam:   GENERAL: Pleasant-appearing in no apparent distress.  HEAD, EYES, EARS, NOSE AND THROAT: Atraumatic, normocephalic. Extraocular muscles are intact. Pupils equal and reactive to light. Sclerae anicteric. No conjunctival injection. No oro-pharyngeal erythema.  NECK: Supple. There is no jugular venous distention. No bruits, no lymphadenopathy, no thyromegaly.  HEART:  Regular rate and rhythm,. No murmurs, no rubs, no clicks.  LUNGS: Clear to auscultation bilaterally. No rales or rhonchi. No wheezes.  ABDOMEN: Soft, flat, nontender, nondistended. Has good bowel sounds. No hepatosplenomegaly appreciated.  EXTREMITIES: No evidence of any cyanosis, clubbing, or peripheral edema.  +2 pedal and radial pulses bilaterally.  NEUROLOGIC: The patient is alert, awake, and oriented x3 with no focal motor or sensory deficits appreciated bilaterally.  SKIN: Moist and warm with no rashes appreciated.  Psych: Not anxious, depressed LN: No inguinal LN enlargement    Antibiotics   Anti-infectives    Start     Dose/Rate Route Frequency Ordered Stop   03/14/16 1245  rifaximin (XIFAXAN) tablet 550 mg     550 mg Oral 2 times daily 03/14/16 1230        Medications   Scheduled Meds: . aspirin EC  81 mg Oral Daily  . citalopram  20 mg Oral Daily  . enoxaparin (LOVENOX) injection  40 mg Subcutaneous Q24H  . folic acid  1 mg Oral Daily  . furosemide  20 mg Oral BID  . insulin aspart  0-5 Units Subcutaneous QHS  . insulin aspart  0-9 Units Subcutaneous TID WC  . lactulose  20 g Oral TID  . losartan  100 mg Oral Daily  . metoCLOPramide  10 mg Oral TID AC  . metoprolol tartrate  25 mg Oral BID  . multivitamin with minerals  1 tablet Oral Daily  . nadolol  40 mg Oral Daily  . OLANZapine  20 mg Oral QHS  . pantoprazole  40 mg Oral Daily  . rifaximin   550 mg Oral BID  . spironolactone  25 mg Oral Daily  . thiamine  100 mg Oral Daily   Or  . thiamine  100 mg Intravenous Daily   Continuous Infusions: . sodium chloride 75 mL/hr at 03/15/16 0155   PRN Meds:.acetaminophen **OR** acetaminophen, albuterol, LORazepam **OR** LORazepam, ondansetron **OR** ondansetron (ZOFRAN) IV, traMADol   Data Review:   Micro Results No results found for this or any previous visit (from the past 240 hour(s)).  Radiology Reports Dg Chest Portable 1 View  03/03/2016  CLINICAL DATA:  Upper abdominal pain. EXAM: PORTABLE CHEST 1 VIEW COMPARISON:  Radiograph of December 06, 2015. FINDINGS: The heart size and mediastinal contours are within normal limits. Both lungs are clear. No pneumothorax or pleural effusion is noted. The visualized skeletal structures are unremarkable. IMPRESSION: No acute cardiopulmonary abnormality seen. Electronically Signed   By: Lupita RaiderJames  Green Jr, M.D.   On: 03/03/2016 11:51     CBC  Recent Labs Lab 03/14/16 0940 03/15/16 0446  WBC 8.1 5.8  HGB 10.2* 9.5*  HCT 29.8* 28.4*  PLT 152 117*  MCV 85.1 85.5  MCH 29.1 28.4  MCHC 34.1 33.3  RDW 15.9* 16.0*    Chemistries   Recent Labs Lab 03/14/16 0940 03/14/16 1327 03/15/16 0446  NA 139  --  141  K 4.8  --  4.1  CL 108  --  107  CO2 25  --  27  GLUCOSE 161*  --  76  BUN 22*  --  18  CREATININE 1.01  --  0.87  CALCIUM 9.0  --  8.9  MG  --  1.8  --   AST 45*  --   --   ALT 40  --   --   ALKPHOS 103  --   --   BILITOT 0.6  --   --    ------------------------------------------------------------------------------------------------------------------  estimated creatinine clearance is 179.7 mL/min (by C-G formula based on Cr of 0.87). ------------------------------------------------------------------------------------------------------------------ No results for input(s): HGBA1C in the last 72  hours. ------------------------------------------------------------------------------------------------------------------ No results for input(s): CHOL, HDL, LDLCALC, TRIG, CHOLHDL, LDLDIRECT in the last 72 hours. ------------------------------------------------------------------------------------------------------------------ No results for input(s): TSH, T4TOTAL, T3FREE, THYROIDAB in the last 72 hours.  Invalid input(s): FREET3 ------------------------------------------------------------------------------------------------------------------ No results for input(s): VITAMINB12, FOLATE, FERRITIN, TIBC, IRON, RETICCTPCT in the last 72 hours.  Coagulation profile No results for input(s): INR, PROTIME in the last 168 hours.  No results for input(s): DDIMER in the last 72 hours.  Cardiac Enzymes  Recent Labs Lab 03/14/16 0940  TROPONINI <0.03   ------------------------------------------------------------------------------------------------------------------ Invalid input(s): POCBNP    Assessment & Plan  Patient is a 35 year old white male with recurrent admissions   1. Acute hepatitic encephalopathy. Continue current therapy with lactulose and rifaximin mean Ammonia level trending down  2. Hypertension. Continue losartan blood pressure is currently stable  3.  Diabetes. Continue therapy with sliding scale monitor blood sugar   4. COPD. Stable. Continue nebulizer when necessary. No evidence of exasperation  5. Eoh abuse on Ciwa protocol     Code Status Orders        Start     Ordered   03/14/16 1231  Full code   Continuous     03/14/16 1230    Code Status History    Date Active Date Inactive Code Status Order ID Comments User Context   03/03/2016 12:13 PM 03/06/2016  2:26 PM Full Code [id number]  Milagros Loll, MD ED   02/10/2016  5:54 PM 02/12/2016  7:34 PM Full Code [id number]  Ramonita Lab, MD Inpatient   11/11/2015  5:16 AM 11/13/2015  6:00 PM Full Code [id number]  Ihor Austin, MD Inpatient   11/07/2015 10:02 PM 11/09/2015  4:56 PM Full Code [id number]  Enid Baas, MD Inpatient   10/28/2015  8:14 PM 10/29/2015  2:26 PM Full Code [id number]  Altamese Dilling, MD Inpatient   10/16/2015 11:47 PM 10/17/2015  6:00 PM Full Code [id number]  Oralia Manis, MD Inpatient   07/30/2015  5:35 PM 08/04/2015  8:34 PM Full Code [id number]  Enedina Finner, MD Inpatient   07/05/2015  1:16 AM 07/08/2015  7:22 PM Full Code [id number]  Oralia Manis, MD Inpatient   05/30/2015  7:28 PM 06/01/2015  3:36 PM Full Code [id number]  Audery Amel, MD Inpatient           Consults  none  DVT Prophylaxis  lovenox  Lab Results  Component Value Date   PLT 117* 03/15/2016     Time Spent in minutes    Greater than 50% of time spent in care coordination and counseling patient regarding the condition and plan of care.   Auburn Bilberry M.D on 03/15/2016 at 11:56 AM  Between 7am to 6pm - Pager - [phone]  After 6pm go to [email or web address] - password [email or web address] [facility]  [facility]   Office  [phone]

## 2016-03-16 LAB — GLUCOSE, CAPILLARY
GLUCOSE-CAPILLARY: 134 mg/dL — AB (ref 65–99)
Glucose-Capillary: 118 mg/dL — ABNORMAL HIGH (ref 65–99)

## 2016-03-16 LAB — AMMONIA: AMMONIA: 77 umol/L — AB (ref 9–35)

## 2016-03-16 MED ORDER — LACTULOSE 10 GM/15ML PO SOLN: 30.0000 g | Freq: Three times a day (TID) | ORAL | Status: DC

## 2016-03-16 NOTE — Progress Notes (Signed)
Pt's Iv is discontented , site was bleeding, applied pressure and placed a gauze on left arm and secured.

## 2016-03-16 NOTE — Discharge Instructions (Signed)
DIET:  Low sodium diet  DISCHARGE CONDITION:  Stable  ACTIVITY:  Activity as tolerated  OXYGEN:  Home Oxygen: No.   Oxygen Delivery: room air  DISCHARGE LOCATION:  home    ADDITIONAL DISCHARGE INSTRUCTION: stop drinking   If you experience worsening of your admission symptoms, develop shortness of breath, life threatening emergency, suicidal or homicidal thoughts you must seek medical attention immediately by calling 911 or calling your MD immediately  if symptoms less severe.  You Must read complete instructions/literature along with all the possible adverse reactions/side effects for all the Medicines you take and that have been prescribed to you. Take any new Medicines after you have completely understood and accpet all the possible adverse reactions/side effects.   Please note  You were cared for by a hospitalist during your hospital stay. If you have any questions about your discharge medications or the care you received while you were in the hospital after you are discharged, you can call the unit and asked to speak with the hospitalist on call if the hospitalist that took care of you is not available. Once you are discharged, your primary care physician will handle any further medical issues. Please note that NO REFILLS for any discharge medications will be authorized once you are discharged, as it is imperative that you return to your primary care physician (or establish a relationship with a primary care physician if you do not have one) for your aftercare needs so that they can reassess your need for medications and monitor your lab values.   Hepatic Encephalopathy Hepatic encephalopathy is a loss of brain function from advanced liver disease. The effects of the condition depend on the type of liver damage and how severe it is. In some cases, hepatic encephalopathy can be reversed. CAUSES The exact cause of hepatic encephalopathy is not known. RISK FACTORS You have a  higher risk of getting this condition if your liver is damaged. When the liver is damaged harmful substances called toxins can build up in the body. Certain toxins, such as ammonia, can harm your brain. Conditions that can cause liver damage include:  An infection.  Dehydration.  Intestinal bleeding.  Drinking too much alcohol.  Taking certain medicines, including tranquilizers, water pills (diuretics), antidepressants, or sleeping pills. SIGNS AND SYMPTOMS Signs and symptoms may develop suddenly. Or, they may develop slowly and get worse gradually. Symptoms can range from mild to severe. Mild Hepatic Encephalopathy  Mild confusion.  Personality and mood changes.  Anxiety and agitation.  Drowsiness.  Loss of mental abilities.  Musty or sweet-smelling breath. Worsening or Severe Hepatic Encephalopathy  Slowed movement.  Slurred speech.  Extreme personality changes.  Disorientation.  Abnormal shaking or flapping of the hands.  Coma. DIAGNOSIS To make a diagnosis, your health care provider will do a physical exam. To rule out other causes of your signs and symptoms, he or she may order tests. You may have:  Blood tests. These may be done to check your ammonia level, measure how long it takes your blood to clot, and check for infection.  Liver function tests. These may be done to check how well your liver is working.  MRI and CT scans. These may be done to check for a brain disorder.  Electroencephalogram (EEG). This may be done to measure the electrical activity in your brain. TREATMENT The first step in treatment is identifying and treating possible triggers. The next step is involves taking medicine to lower the level of toxins in the  body and to prevent ammonia from building up. You may need to take:  Antibiotics to reduce the ammonia-producing bacteria in your gut.  Lactulose to help flush ammonia from the gut. HOME CARE INSTRUCTIONS Eating and  Drinking  Follow a low-protein diet that includes plenty of fruits, vegetables, and whole grains, as directed by your health care provider. Ammonia is produced when you digest high-protein foods.  Work with a Data processing managerdietitian or with your health care provider to make sure you are getting the right balance of protein and minerals.  Drink enough fluids to keep your urine clear or pale yellow. Drinking plenty of water helps prevent constipation.  Do not drink alcohol or use illegal drugs. Medicines  Only take medicine as directed by your health care provider.  If you were prescribed an antibiotic medicine, finish it all even if you start to feel better.  Do not start any new medicines, including over-the-counter medicines, without first checking with your health care provider. SEEK MEDICAL CARE IF:  You have new symptoms.  Your symptoms change.  Your symptoms get worse.  You have a fever.  You are constipated.  You have persistent nausea, vomiting, or diarrhea. SEEK IMMEDIATE MEDICAL CARE IF:  You become very confused or drowsy.  You vomit blood or material that looks like coffee grounds.  Your stool is bloody or black or looks like tar.   This information is not intended to replace advice given to you by your health care provider. Make sure you discuss any questions you have with your health care provider.   Document Released: 11/18/2006 Document Revised: 09/29/2014 Document Reviewed: 04/26/2014 Elsevier Interactive Patient Education [employer]2016 [employer]

## 2016-03-16 NOTE — Discharge Summary (Signed)
Howard LinemanJoey A [name], 35 y.o., DOB 12/29/1980, MRN [id number]. Admission date: 03/14/2016 Discharge Date 03/16/2016 Primary MD Marisue IvanLINTHAVONG, [name], MD Admitting Physician Shaune PollackQing Chen, MD  Admission Diagnosis  Hepatic encephalopathy [facility](HCC) [K72.90]  Discharge Diagnosis   Principal Problem:   Discharge Diagnosis Active Problems:  Hepatic encephalopathy (HCC) Alcoholic liver cirrhosis with ascites Hypertension Schizophrenia Depression Diabetes type 2 Chronic respiratory on chronic oxygen therapy Esophageal varices         Hospital Course Howard [name] is a 35 y.o. male with a known history of alcoholic liver cirrhosis, hypertension, schizophrenia, diabetes mellitus, COPD on 2 L oxygen, recurrent admission for hepatic encephalopathypresents again to the hospital secondary to worsening confusion Patient's ammonia level was very high and was admitted for hepatic encephalopathy. Patient's lactulose dose was increased. Patient's mental status is back to baseline. Despite being admitted on multiple occasions he continues to use drink have recommended that he stop drinking. Patient currently is back to baseline and is being discharged home.             Consults  None  Significant Tests:  See full reports for all details      Dg Chest Portable 1 View  03/03/2016  CLINICAL DATA:  Upper abdominal pain. EXAM: PORTABLE CHEST 1 VIEW COMPARISON:  Radiograph of December 06, 2015. FINDINGS: The heart size and mediastinal contours are within normal limits. Both lungs are clear. No pneumothorax or pleural effusion is noted. The visualized skeletal structures are unremarkable. IMPRESSION: No acute cardiopulmonary abnormality seen. Electronically Signed   By: Lupita RaiderJames  Green Jr, M.D.   On: 03/03/2016 11:51       Today   Subjective:   Howard BowlerJoey [name]  Feels better back to baseline  Objective:   Blood pressure 130/78, pulse 67, temperature 98.4 F (36.9 C), temperature source Oral, resp. rate 20, height 6'  3" (1.905 m), weight 146.376 kg (322 lb 11.2 oz), SpO2 100 %.  .  Intake/Output Summary (Last 24 hours) at 03/16/16 1338 Last data filed at 03/16/16 1235  Gross per 24 hour  Intake   2634 ml  Output   2900 ml  Net   -266 ml    Exam VITAL SIGNS: Blood pressure 130/78, pulse 67, temperature 98.4 F (36.9 C), temperature source Oral, resp. rate 20, height 6\' 3"  (1.905 m), weight 146.376 kg (322 lb 11.2 oz), SpO2 100 %.  GENERAL:  35 y.o.-year-old patient lying in the bed with no acute distress.  EYES: Pupils equal, round, reactive to light and accommodation. No scleral icterus. Extraocular muscles intact.  HEENT: Head atraumatic, normocephalic. Oropharynx and nasopharynx clear.  NECK:  Supple, no jugular venous distention. No thyroid enlargement, no tenderness.  LUNGS: Normal breath sounds bilaterally, no wheezing, rales,rhonchi or crepitation. No use of accessory muscles of respiration.  CARDIOVASCULAR: S1, S2 normal. No murmurs, rubs, or gallops.  ABDOMEN: Soft, nontender, nondistended. Bowel sounds present. No organomegaly or mass.  EXTREMITIES: No pedal edema, cyanosis, or clubbing.  NEUROLOGIC: Cranial nerves II through XII are intact. Muscle strength 5/5 in all extremities. Sensation intact. Gait not checked.  PSYCHIATRIC: The patient is alert and oriented x 3.  SKIN: No obvious rash, lesion, or ulcer.   Data Review     CBC w Diff: Lab Results  Component Value Date   WBC 5.8 03/15/2016   WBC 10.1 12/25/2014   HGB 9.5* 03/15/2016   HGB 14.4 12/25/2014   HCT 28.4* 03/15/2016   HCT 44.7 12/25/2014   PLT 117* 03/15/2016   PLT 155  12/25/2014   LYMPHOPCT 22 02/10/2016   MONOPCT 9 02/10/2016   EOSPCT 3 02/10/2016   BASOPCT 1 02/10/2016   CMP: Lab Results  Component Value Date   NA 141 03/15/2016   NA 141 12/25/2014   K 4.1 03/15/2016   K 3.4* 12/25/2014   CL 107 03/15/2016   CL 100* 12/25/2014   CO2 27 03/15/2016   CO2 30 12/25/2014   BUN 18 03/15/2016   BUN 15  12/25/2014   CREATININE 0.87 03/15/2016   CREATININE 1.23 12/25/2014   PROT 7.6 03/14/2016   PROT 8.5* 12/25/2014   ALBUMIN 3.8 03/14/2016   ALBUMIN 4.1 12/25/2014   BILITOT 0.6 03/14/2016   BILITOT 0.9 12/25/2014   ALKPHOS 103 03/14/2016   ALKPHOS 148* 12/25/2014   AST 45* 03/14/2016   AST 146* 12/25/2014   ALT 40 03/14/2016   ALT 107* 12/25/2014  .  Micro Results No results found for this or any previous visit (from the past 240 hour(s)).      Code Status Orders        Start     Ordered   03/14/16 1231  Full code   Continuous     03/14/16 1230    Code Status History    Date Active Date Inactive Code Status Order ID Comments User Context   03/03/2016 12:13 PM 03/06/2016  2:26 PM Full Code [id number]  Milagros Loll, MD ED   02/10/2016  5:54 PM 02/12/2016  7:34 PM Full Code [id number]  Ramonita Lab, MD Inpatient   11/11/2015  5:16 AM 11/13/2015  6:00 PM Full Code [id number]  Ihor Austin, MD Inpatient   11/07/2015 10:02 PM 11/09/2015  4:56 PM Full Code [id number]  Enid Baas, MD Inpatient   10/28/2015  8:14 PM 10/29/2015  2:26 PM Full Code [id number]  Altamese Dilling, MD Inpatient   10/16/2015 11:47 PM 10/17/2015  6:00 PM Full Code [id number]  Oralia Manis, MD Inpatient   07/30/2015  5:35 PM 08/04/2015  8:34 PM Full Code [id number]  Enedina Finner, MD Inpatient   07/05/2015  1:16 AM 07/08/2015  7:22 PM Full Code [id number]  Oralia Manis, MD Inpatient   05/30/2015  7:28 PM 06/01/2015  3:36 PM Full Code [id number]  Audery Amel, MD Inpatient          Follow-up Information    Follow up with Marisue Ivan, MD. Schedule an appointment as soon as possible for a visit in 5 days.   Specialty:  Family Medicine   Contact information:   [address] [facility] [place] [address] [phone]       Discharge Medications     Medication List    TAKE these medications        albuterol 108 (90 Base) MCG/ACT inhaler  Commonly known as:  PROVENTIL  HFA;VENTOLIN HFA  Inhale 2 puffs into the lungs every 6 (six) hours as needed for wheezing or shortness of breath. Reported on 11/11/2015     aspirin EC 81 MG tablet  Take 81 mg by mouth daily.     citalopram 20 MG tablet  Commonly known as:  CELEXA  Take 1 tablet (20 mg total) by mouth daily.     furosemide 20 MG tablet  Commonly known as:  LASIX  Take 1 tablet (20 mg total) by mouth 2 (two) times daily.     lactulose 10 GM/15ML solution  Commonly known as:  CHRONULAC  Take 45 mLs (30 g total) by mouth 3 (three) times  daily.     losartan 100 MG tablet  Commonly known as:  COZAAR  Take 100 mg by mouth daily.     metFORMIN 500 MG tablet  Commonly known as:  GLUCOPHAGE  Take 1 tablet (500 mg total) by mouth 2 (two) times daily with a meal.     metoCLOPramide 10 MG tablet  Commonly known as:  REGLAN  Take 1 tablet (10 mg total) by mouth 3 (three) times daily before meals.     metoprolol tartrate 25 MG tablet  Commonly known as:  LOPRESSOR  Take 25 mg by mouth 2 (two) times daily.     multivitamin with minerals Tabs tablet  Take 1 tablet by mouth daily.     nadolol 40 MG tablet  Commonly known as:  CORGARD  Take 1 tablet (40 mg total) by mouth daily.     OLANZapine 20 MG tablet  Commonly known as:  ZYPREXA  Take 1 tablet (20 mg total) by mouth at bedtime.     omeprazole 40 MG capsule  Commonly known as:  PRILOSEC  Take 1 capsule (40 mg total) by mouth every morning.     ondansetron 4 MG tablet  Commonly known as:  ZOFRAN  Take 1 tablet (4 mg total) by mouth daily as needed for nausea or vomiting.     rifaximin 550 MG Tabs tablet  Commonly known as:  XIFAXAN  Take 1 tablet (550 mg total) by mouth 2 (two) times daily.     spironolactone 25 MG tablet  Commonly known as:  ALDACTONE  Take 1 tablet (25 mg total) by mouth daily.           Total Time in preparing paper work, data evaluation and todays exam - 35 minutes  Auburn BilberryPATEL, Nolene Rocks M.D on 03/16/2016 at 1:38  PM  [facility]   Office  [phone]

## 2016-03-16 NOTE — Progress Notes (Signed)
Pt A and O x 4. VSS. Pt tolerating diet well. No complaints of pain or nausea. IV removed intact, prescriptions given. Pt voiced understanding of discharge instructions with no further questions. Pt discharged via wheelchair with nurse aide.   

## 2016-03-16 NOTE — Progress Notes (Signed)
Pt is laying in bed, mom is in the room with him.  Pt's medication given by mouth.  Pt was asked to get up and walk and pt stated "I sure will do I get to leave today".  The nurse contacted the doctor due to Pts albumin levels which are still elevated.  Doctor told the nurse its ok to discharged the pt.  Retrieved  portable oxygen  And walked pt around, pt completed two laps around the nurse's station with no s/s of pain or distress.  Pt sat back on bed, hooked back up on oxygen 2 liters, and made pt comfortable.  Mom did bring his portable oxygen when he is discharged. Call light is with in reach.

## 2016-03-23 ENCOUNTER — Emergency Department
Admission: EM | Admit: 2016-03-23 | Discharge: 2016-03-24 | Disposition: A | Discharge status: Home / Self Care | Payer: Medicare Other | Attending: Emergency Medicine | Admitting: Emergency Medicine

## 2016-03-23 ENCOUNTER — Encounter: Payer: Self-pay | Admitting: Emergency Medicine

## 2016-03-23 DIAGNOSIS — Z5181 Encounter for therapeutic drug level monitoring: Secondary | ICD-10-CM | POA: Diagnosis not present

## 2016-03-23 DIAGNOSIS — IMO0001 Reserved for inherently not codable concepts without codable children: Secondary | ICD-10-CM

## 2016-03-23 DIAGNOSIS — F200 Paranoid schizophrenia: Secondary | ICD-10-CM | POA: Diagnosis not present

## 2016-03-23 DIAGNOSIS — F329 Major depressive disorder, single episode, unspecified: Secondary | ICD-10-CM | POA: Insufficient documentation

## 2016-03-23 DIAGNOSIS — J449 Chronic obstructive pulmonary disease, unspecified: Secondary | ICD-10-CM | POA: Diagnosis not present

## 2016-03-23 DIAGNOSIS — I10 Essential (primary) hypertension: Secondary | ICD-10-CM | POA: Insufficient documentation

## 2016-03-23 DIAGNOSIS — Z7984 Long term (current) use of oral hypoglycemic drugs: Secondary | ICD-10-CM | POA: Diagnosis not present

## 2016-03-23 DIAGNOSIS — E785 Hyperlipidemia, unspecified: Secondary | ICD-10-CM | POA: Insufficient documentation

## 2016-03-23 DIAGNOSIS — R195 Other fecal abnormalities: Secondary | ICD-10-CM | POA: Insufficient documentation

## 2016-03-23 DIAGNOSIS — Z7982 Long term (current) use of aspirin: Secondary | ICD-10-CM | POA: Diagnosis not present

## 2016-03-23 DIAGNOSIS — E119 Type 2 diabetes mellitus without complications: Secondary | ICD-10-CM | POA: Insufficient documentation

## 2016-03-23 LAB — COMPREHENSIVE METABOLIC PANEL
ALT: 41 U/L (ref 17–63)
AST: 44 U/L — AB (ref 15–41)
Albumin: 3.7 g/dL (ref 3.5–5.0)
Alkaline Phosphatase: 102 U/L (ref 38–126)
Anion gap: 7 (ref 5–15)
BUN: 19 mg/dL (ref 6–20)
CHLORIDE: 106 mmol/L (ref 101–111)
CO2: 25 mmol/L (ref 22–32)
Calcium: 8.4 mg/dL — ABNORMAL LOW (ref 8.9–10.3)
Creatinine, Ser: 0.92 mg/dL (ref 0.61–1.24)
GFR calc Af Amer: >60 mL/min (ref >60)
Glucose, Bld: 103 mg/dL — ABNORMAL HIGH (ref 65–99)
POTASSIUM: 4.3 mmol/L (ref 3.5–5.1)
Sodium: 138 mmol/L (ref 135–145)
Total Bilirubin: 0.6 mg/dL (ref 0.3–1.2)
Total Protein: 7.7 g/dL (ref 6.5–8.1)

## 2016-03-23 LAB — CBC
HEMATOCRIT: 28.8 % — AB (ref 40.0–52.0)
HEMOGLOBIN: 10 g/dL — AB (ref 13.0–18.0)
MCH: 29.3 pg (ref 26.0–34.0)
MCHC: 34.6 g/dL (ref 32.0–36.0)
MCV: 84.8 fL (ref 80.0–100.0)
Platelets: 171 10*3/uL (ref 150–440)
RBC: 3.4 MIL/uL — AB (ref 4.40–5.90)
RDW: 16 % — ABNORMAL HIGH (ref 11.5–14.5)
WBC: 7.5 10*3/uL (ref 3.8–10.6)

## 2016-03-23 LAB — TYPE AND SCREEN
ABO/RH(D): O POS
Antibody Screen: NEGATIVE

## 2016-03-23 LAB — PROTIME-INR
INR: 1.28
PROTHROMBIN TIME: 16.1 s — AB (ref 11.4–15.0)

## 2016-03-23 LAB — AMMONIA: AMMONIA: 88 umol/L — AB (ref 9–35)

## 2016-03-23 LAB — APTT: APTT: 34 s (ref 24–36)

## 2016-03-23 MED ORDER — LACTULOSE 10 GM/15ML PO SOLN
45.0000 g | ORAL | Status: AC
  Administered 2016-03-23: 45 g via ORAL
  Filled 2016-03-23: qty 90

## 2016-03-23 NOTE — Discharge Instructions (Signed)
Please return to the emergency room right away if you are to develop a fever, severe nausea, you have abdominal pain, you are unable to keep food down, begin vomiting or have further dark or bloody stools, you develop any dark or bloody vomit, feel dehydrated, or other new concerns or symptoms arise.   Gastrointestinal Bleeding Gastrointestinal (GI) bleeding means there is bleeding somewhere along the digestive tract, between the mouth and anus. CAUSES  There are many different problems that can cause GI bleeding. Possible causes include:  Esophagitis. This is inflammation, irritation, or swelling of the esophagus.  Hemorrhoids.These are veins that are full of blood (engorged) in the rectum. They cause pain, inflammation, and may bleed.  Anal fissures.These are areas of painful tearing which may bleed. They are often caused by passing hard stool.  Diverticulosis.These are pouches that form on the colon over time, with age, and may bleed significantly.  Diverticulitis.This is inflammation in areas with diverticulosis. It can cause pain, fever, and bloody stools, although bleeding is rare.  Polyps and cancer. Colon cancer often starts out as precancerous polyps.  Gastritis and ulcers.Bleeding from the upper gastrointestinal tract (near the stomach) may travel through the intestines and produce black, sometimes tarry, often bad smelling stools. In certain cases, if the bleeding is fast enough, the stools may not be black, but red. This condition may be life-threatening. SYMPTOMS   Vomiting bright red blood or material that looks like coffee grounds.  Bloody, black, or tarry stools. DIAGNOSIS  Your caregiver may diagnose your condition by taking your history and performing a physical exam. More tests may be needed, including:  X-rays and other imaging tests.  Esophagogastroduodenoscopy (EGD). This test uses a flexible, lighted tube to look at your esophagus, stomach, and small  intestine.  Colonoscopy. This test uses a flexible, lighted tube to look at your colon. TREATMENT  Treatment depends on the cause of your bleeding.   For bleeding from the esophagus, stomach, small intestine, or colon, the caregiver doing your EGD or colonoscopy may be able to stop the bleeding as part of the procedure.  Inflammation or infection of the colon can be treated with medicines.  Many rectal problems can be treated with creams, suppositories, or warm baths.  Surgery is sometimes needed.  Blood transfusions are sometimes needed if you have lost a lot of blood. If bleeding is slow, you may be allowed to go home. If there is a lot of bleeding, you will need to stay in the hospital for observation. HOME CARE INSTRUCTIONS   Take any medicines exactly as prescribed.  Keep your stools soft by eating foods that are high in fiber. These foods include whole grains, legumes, fruits, and vegetables. Prunes (1 to 3 a day) work well for many people.  Drink enough fluids to keep your urine clear or pale yellow. SEEK IMMEDIATE MEDICAL CARE IF:   Your bleeding increases.  You feel lightheaded, weak, or you faint.  You have severe cramps in your back or abdomen.  You pass large blood clots in your stool.  Your problems are getting worse. MAKE SURE YOU:   Understand these instructions.  Will watch your condition.  Will get help right away if you are not doing well or get worse.   This information is not intended to replace advice given to you by your health care provider. Make sure you discuss any questions you have with your health care provider.   Document Released: 09/05/2000 Document Revised: 08/25/2012 Document Reviewed:  02/26/2015 Elsevier Interactive Patient Education [employer]2016 [employer]

## 2016-03-23 NOTE — ED Notes (Signed)
Patient states that this afternoon he had an episode of black, loose stool. Patient denies abdominal pain. Patient states that he is feeling very weak for the past few weeks. Patient has a hx/o cirrhosis.

## 2016-03-23 NOTE — ED Provider Notes (Signed)
[facility] Emergency Department Provider Note  ____________________________________________  Time seen: Approximately 9:02 PM  I have reviewed the triage vital signs and the nursing notes.   HISTORY  Chief Complaint Melena    HPI Howard [name] is a 35 y.o. male presents for evaluation of a black stool today.  Patient reports that about 2 hours prior to arrival he had one formed but black bowel movement. He reports that he is concerned there could be blood in his stool. He reports this has happened before. He also reports he was recently admitted for some confusion, but has been taking his lactulose and not experiencing any more confusion since leaving the hospital.  Patient's mother is with him, reports he is not having any confusion, but she was aware that he had one black stool. This has happened in previous and he has a history of varices and GI bleeding.  He took an aspirin yesterday, and he thinks this could be related.  No nausea or vomiting. No chest pain. No abdominal pain. Reports his only symptom was a black stool and he is concerned about possible bleeding. He has not had any further bleeding or black stools.   Past Medical History  Diagnosis Date  . Hypertension   . Schizophrenia (HCC)   . Hyperlipemia   . Depression   . Diabetes (HCC)   . Liver disease   . Diabetes mellitus, type II (HCC)   . Heart disease   . Alcoholic cirrhosis of liver with ascites (HCC)   . Esophageal varices (HCC)   . Portal hypertensive gastropathy   . COPD (chronic obstructive pulmonary disease) [facility](HCC)     Patient Active Problem List   Diagnosis Date Noted  . Acute hepatic encephalopathy (HCC) 03/14/2016  . Controlled type 2 diabetes mellitus without complication (HCC) 01/15/2016  . Normocytic anemia 12/12/2015  . Essential (primary) hypertension 12/11/2015  . Pure hypercholesterolemia 12/11/2015  . Fever 11/11/2015  . Ascites due to alcoholic cirrhosis  (HCC) 11/11/2015  . Cirrhosis of liver with ascites (HCC) 11/11/2015  . Hepatic encephalopathy (HCC) 10/16/2015  . Type 2 diabetes mellitus (HCC) 10/16/2015  . Acute renal failure (ARF) (HCC) 07/30/2015  . 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 Alcoholic cirrhosis (HCC) 07/19/2015  . Hypoxia 07/04/2015  . Elevated transaminase level 07/04/2015  . DOE (dyspnea on exertion) 07/04/2015  . Alcohol abuse 05/31/2015  . Hypertension 05/29/2015  . Paranoid schizophrenia (HCC) 03/20/2015    Past Surgical History  Procedure Laterality Date  . No past surgeries    . Esophagogastroduodenoscopy (egd) with propofol N/A 08/03/2015    Procedure: ESOPHAGOGASTRODUODENOSCOPY (EGD) WITH PROPOFOL;  Surgeon: Elnita MaxwellMatthew Gordon Rein, MD;  [facility];  Service: Endoscopy;  Laterality: N/A;  . Esophagogastroduodenoscopy (egd) with propofol N/A 08/31/2015    Procedure: ESOPHAGOGASTRODUODENOSCOPY (EGD) WITH PROPOFOL;  Surgeon: Elnita MaxwellMatthew Gordon Rein, MD;  [facility];  Service: Endoscopy;  Laterality: N/A;  . Esophagogastroduodenoscopy N/A 10/05/2015    Procedure: ESOPHAGOGASTRODUODENOSCOPY (EGD);  Surgeon: Elnita MaxwellMatthew Gordon Rein, MD;  [facility];  Service: Endoscopy;  Laterality: N/A;    Current Outpatient Rx  Name  Route  Sig  Dispense  Refill  . albuterol (PROVENTIL HFA;VENTOLIN HFA) 108 (90 BASE) MCG/ACT inhaler   Inhalation   Inhale 2 puffs into the lungs every 6 (six) hours as needed for wheezing or shortness of breath. Reported on 11/11/2015         . aspirin EC 81 MG tablet  Oral   Take 81 mg by mouth daily.          . citalopram (CELEXA) 20 MG tablet   Oral   Take 1 tablet (20 mg total) by mouth daily.   90 tablet   1   . furosemide (LASIX) 20 MG tablet   Oral   Take 1 tablet (20 mg total) by mouth 2 (two) times daily.   30 tablet   2   . lactulose (CHRONULAC) 10 GM/15ML solution   Oral   Take 45 mLs (30 g total) by mouth 3  (three) times daily.   236 mL   5   . losartan (COZAAR) 100 MG tablet   Oral   Take 100 mg by mouth daily.         . metFORMIN (GLUCOPHAGE) 500 MG tablet   Oral   Take 1 tablet (500 mg total) by mouth 2 (two) times daily with a meal.   30 tablet   0   . metoCLOPramide (REGLAN) 10 MG tablet   Oral   Take 1 tablet (10 mg total) by mouth 3 (three) times daily before meals.   20 tablet   0   . metoprolol tartrate (LOPRESSOR) 25 MG tablet   Oral   Take 25 mg by mouth 2 (two) times daily.         . Multiple Vitamin (MULTIVITAMIN WITH MINERALS) TABS tablet   Oral   Take 1 tablet by mouth daily.   30 tablet   0   . nadolol (CORGARD) 40 MG tablet   Oral   Take 1 tablet (40 mg total) by mouth daily.   30 tablet   2   . OLANZapine (ZYPREXA) 20 MG tablet   Oral   Take 1 tablet (20 mg total) by mouth at bedtime.   90 tablet   1   . omeprazole (PRILOSEC) 40 MG capsule   Oral   Take 1 capsule (40 mg total) by mouth every morning.   30 capsule   2   . ondansetron (ZOFRAN) 4 MG tablet   Oral   Take 1 tablet (4 mg total) by mouth daily as needed for nausea or vomiting.   20 tablet   1   . rifaximin (XIFAXAN) 550 MG TABS tablet   Oral   Take 1 tablet (550 mg total) by mouth 2 (two) times daily.   60 tablet   2   . spironolactone (ALDACTONE) 25 MG tablet   Oral   Take 1 tablet (25 mg total) by mouth daily.   30 tablet   2     Allergies Review of patient's allergies indicates no known allergies.  Family History  Problem Relation Age of Onset  . Heart disease Mother   . Hypertension Mother   . Hyperlipidemia Mother   . Stroke Father   . Heart attack Father   . Hypertension Father   . Heart disease Father   . Alcohol abuse Father   . Heart disease Brother     Social History Social History  Substance Use Topics  . Smoking status: Never Smoker   . Smokeless tobacco: Never Used  . Alcohol Use: 0.0 oz/week    0 Shots of liquor, 0 Standard drinks or  equivalent per week     Comment: last drink 02/09/16    Review of Systems Constitutional: No fever/chills Eyes: No visual changes. ENT: No sore throat. Cardiovascular: Denies chest pain. Respiratory: Denies shortness of breath. Gastrointestinal: No  abdominal pain.  No nausea, no vomiting.  No diarrhea.  No constipation.See history of present illness Genitourinary: Negative for dysuria. Musculoskeletal: Negative for back pain. Skin: Negative for rash. Neurological: Negative for headaches, focal weakness or numbness.  10-point ROS otherwise negative.  ____________________________________________   PHYSICAL EXAM:  VITAL SIGNS: ED Triage Vitals  Enc Vitals Group     BP 03/23/16 1815 120/60 mmHg     Pulse Rate 03/23/16 1815 81     Resp 03/23/16 1815 16     Temp 03/23/16 1815 98.5 F (36.9 C)     Temp Source 03/23/16 1815 Oral     SpO2 03/23/16 1815 96 %     Weight 03/23/16 1815 323 lb (146.512 kg)     Height 03/23/16 1815 6\' 3"  (1.905 m)     Head Cir --      Peak Flow --      Pain Score 03/23/16 1816 0     Pain Loc --      Pain Edu? --      Excl. in GC? --    Constitutional: Alert and oriented. Well appearing and in no acute distress.Patient resting comfortably, explains to me that he is playing a game called "battle cry" on his cell phone. He is very pleasant and in no distress. He is perfectly oriented. Conversant and in no distress. Eyes: Conjunctivae are slightly injected. PERRL. EOMI. Head: Atraumatic. Nose: No congestion/rhinnorhea. Mouth/Throat: Mucous membranes are moist.  Oropharynx non-erythematous. Neck: No stridor.   Cardiovascular: Normal rate, regular rhythm. Grossly normal heart sounds.  Good peripheral circulation. Respiratory: Normal respiratory effort.  No retractions. Lungs CTAB. Gastrointestinal: Soft and nontender. No distention. No abdominal bruits. No CVA tenderness. Obesity. Rectal exam performed with nurse Matt at bedside. Primarily brown formed  stool, though it is slightly Hemoccult positive. No evidence of significant or large volume of bleeding by rectal exam, though again Hemoccult is positive. Musculoskeletal: No lower extremity tenderness nor edema.  No joint effusions. Neurologic:  Normal speech and language. No gross focal neurologic deficits are appreciated. Skin:  Skin is warm, dry and intact. No rash noted. Psychiatric: Mood and affect are calm and slightly flat. Speech and behavior are normal.  ____________________________________________   LABS (all labs ordered are listed, but only abnormal results are displayed)  Labs Reviewed  COMPREHENSIVE METABOLIC PANEL - Abnormal; Notable for the following:    Glucose, Bld 103 (*)    Calcium 8.4 (*)    AST 44 (*)    All other components within normal limits  CBC - Abnormal; Notable for the following:    RBC 3.40 (*)    Hemoglobin 10.0 (*)    HCT 28.8 (*)    RDW 16.0 (*)    All other components within normal limits  AMMONIA - Abnormal; Notable for the following:    Ammonia 88 (*)    All other components within normal limits  PROTIME-INR - Abnormal; Notable for the following:    Prothrombin Time 16.1 (*)    All other components within normal limits  APTT  HEMOGLOBIN AND HEMATOCRIT, BLOOD  POC OCCULT BLOOD, ED  TYPE AND SCREEN   ____________________________________________  EKG   ____________________________________________  RADIOLOGY   ____________________________________________   PROCEDURES  Procedure(s) performed: None  Critical Care performed: No  ____________________________________________   INITIAL IMPRESSION / ASSESSMENT AND PLAN / ED COURSE  Pertinent labs & imaging results that were available during my care of the patient were reviewed by me and considered in  my medical decision making (see chart for details).  Patient presents for evaluation of a loose black stool. He does not complain of any other associated symptoms. Does have a  history of cirrhosis, varices. Overall very reassuring clinical exam. His hemoglobin is stable from previous admission. His ammonia level appears stable, and he shows no evidence of encephalopathy.  The patient tells me that his last hospital stay he did have some vomiting with blood in it, and I question if the dark stool he sees now could be from a previous small amount of bleeding associated with his last hospitalization as his hemoglobins quite stable. Based on his clinical history, history of varices I do not believe this represents a bleeding varices is not complaining of any upper symptoms vomiting.  Discussed with the patient, also with his mother. Feel a reasonable plan is to recheck his hemoglobin at midnight and observe him for further melena. His hemoglobin is stable, I think he would be appropriate for careful return precautions and GI follow-up. He does not demonstrate evidence or symptoms suggest rupture of varices or significant hemorrhage at this time.  I did discuss with the patient discontinuation of NSAIDs and aspirin. He is agreeable.  ----------------------------------------- 11:41 PM on 03/23/2016 -----------------------------------------  Patient is doing well. He reports no further bowel movements, no further loose or bloody stools during his observation period.  Plan of care assigned to Dr. Dolores Frame, BP hemoglobin and hematocrit, if patient not having any further evidence of rectal bleeding or significant drop in his hemoglobin a feel he would be appropriate to be discharged to follow up closely with his gastroenterologist. Discontinue all NSAID use, and  return precautions have been discussed with the patient and his mom were both in agreement. ____________________________________________   FINAL CLINICAL IMPRESSION(S) / ED DIAGNOSES  Final diagnoses:  Blood stool  Occult blood in stools      Sharyn Creamer, MD 03/23/16 2345

## 2016-03-23 NOTE — ED Notes (Signed)
Pt. States he noticed dark runny stools today.  Pt. States hx of cirrhosis.  Pt. Denies pain.

## 2016-03-24 LAB — HEMOGLOBIN AND HEMATOCRIT, BLOOD
HEMATOCRIT: 28.1 % — AB (ref 40.0–52.0)
HEMOGLOBIN: 9.4 g/dL — AB (ref 13.0–18.0)

## 2016-03-24 NOTE — ED Notes (Signed)
Pt. Going home with mother. 

## 2016-03-24 NOTE — ED Provider Notes (Signed)
-----------------------------------------   1:27 AM on 03/24/2016 -----------------------------------------  Updated patient of repeat hemoglobin and hematocrit which are not significantly decreased from baseline for patient. He denies further melena. Will start discharge processes slowly; patient given a meal tray. Will proceed with discharge per Dr. Lorenza ChickQuale's plan to discharge home with close GI follow-up and discontinuation of all NSAID use.  Irean HongJade J Sung, MD 03/24/16 [phone]

## 2016-04-01 ENCOUNTER — Encounter: Payer: Self-pay | Admitting: Emergency Medicine

## 2016-04-01 DIAGNOSIS — F329 Major depressive disorder, single episode, unspecified: Secondary | ICD-10-CM | POA: Insufficient documentation

## 2016-04-01 DIAGNOSIS — K2971 Gastritis, unspecified, with bleeding: Secondary | ICD-10-CM | POA: Insufficient documentation

## 2016-04-01 DIAGNOSIS — I10 Essential (primary) hypertension: Secondary | ICD-10-CM | POA: Diagnosis not present

## 2016-04-01 DIAGNOSIS — E785 Hyperlipidemia, unspecified: Secondary | ICD-10-CM | POA: Diagnosis not present

## 2016-04-01 DIAGNOSIS — Z7984 Long term (current) use of oral hypoglycemic drugs: Secondary | ICD-10-CM | POA: Insufficient documentation

## 2016-04-01 DIAGNOSIS — J449 Chronic obstructive pulmonary disease, unspecified: Secondary | ICD-10-CM | POA: Insufficient documentation

## 2016-04-01 DIAGNOSIS — F200 Paranoid schizophrenia: Secondary | ICD-10-CM | POA: Insufficient documentation

## 2016-04-01 DIAGNOSIS — Z79899 Other long term (current) drug therapy: Secondary | ICD-10-CM | POA: Diagnosis not present

## 2016-04-01 DIAGNOSIS — Z7982 Long term (current) use of aspirin: Secondary | ICD-10-CM | POA: Diagnosis not present

## 2016-04-01 DIAGNOSIS — E119 Type 2 diabetes mellitus without complications: Secondary | ICD-10-CM | POA: Diagnosis not present

## 2016-04-01 DIAGNOSIS — R1011 Right upper quadrant pain: Secondary | ICD-10-CM | POA: Diagnosis present

## 2016-04-01 LAB — COMPREHENSIVE METABOLIC PANEL
ALBUMIN: 3.8 g/dL (ref 3.5–5.0)
ALT: 36 U/L (ref 17–63)
AST: 15 U/L (ref 15–41)
Alkaline Phosphatase: 103 U/L (ref 38–126)
Anion gap: 9 (ref 5–15)
BUN: 20 mg/dL (ref 6–20)
CHLORIDE: 108 mmol/L (ref 101–111)
CO2: 22 mmol/L (ref 22–32)
Calcium: 8.8 mg/dL — ABNORMAL LOW (ref 8.9–10.3)
Creatinine, Ser: 1.02 mg/dL (ref 0.61–1.24)
GFR calc Af Amer: >60 mL/min (ref >60)
GFR calc non Af Amer: >60 mL/min (ref >60)
GLUCOSE: 112 mg/dL — AB (ref 65–99)
POTASSIUM: 3.5 mmol/L (ref 3.5–5.1)
SODIUM: 139 mmol/L (ref 135–145)
Total Bilirubin: 0.7 mg/dL (ref 0.3–1.2)
Total Protein: 7.4 g/dL (ref 6.5–8.1)

## 2016-04-01 LAB — CBC
HEMATOCRIT: 29 % — AB (ref 40.0–52.0)
HEMOGLOBIN: 9.6 g/dL — AB (ref 13.0–18.0)
MCH: 28.9 pg (ref 26.0–34.0)
MCHC: 33.3 g/dL (ref 32.0–36.0)
MCV: 86.8 fL (ref 80.0–100.0)
Platelets: 141 10*3/uL — ABNORMAL LOW (ref 150–440)
RBC: 3.34 MIL/uL — ABNORMAL LOW (ref 4.40–5.90)
RDW: 17.2 % — AB (ref 11.5–14.5)
WBC: 7.8 10*3/uL (ref 3.8–10.6)

## 2016-04-01 LAB — TYPE AND SCREEN
ABO/RH(D): O POS
ANTIBODY SCREEN: NEGATIVE

## 2016-04-01 NOTE — ED Notes (Signed)
Pt arrived to the ED accompanied by his mother for complaints of rectal bleeding and abdominal pain. Pt reports that he has a scheduled endoscopy in 1 day for a suspected ulcer. Pt states that "today he went to the bathroom to have a BM and a lot of blood came out." Pt is AOx4 in no apparent distress.

## 2016-04-01 NOTE — ED Notes (Signed)
EDT, Kyonna Frier accidentally checked off hemoccult ready at bedside. Pt still in ED waiting room. Blood has been sent to lab

## 2016-04-02 ENCOUNTER — Emergency Department: Payer: Medicare Other

## 2016-04-02 ENCOUNTER — Emergency Department
Admission: EM | Admit: 2016-04-02 | Discharge: 2016-04-02 | Disposition: A | Discharge status: Home / Self Care | Payer: Medicare Other | Attending: Emergency Medicine | Admitting: Emergency Medicine

## 2016-04-02 DIAGNOSIS — R52 Pain, unspecified: Secondary | ICD-10-CM

## 2016-04-02 DIAGNOSIS — K922 Gastrointestinal hemorrhage, unspecified: Secondary | ICD-10-CM

## 2016-04-02 DIAGNOSIS — R109 Unspecified abdominal pain: Secondary | ICD-10-CM

## 2016-04-02 DIAGNOSIS — K2971 Gastritis, unspecified, with bleeding: Secondary | ICD-10-CM | POA: Diagnosis not present

## 2016-04-02 LAB — LIPASE, BLOOD: LIPASE: 25 U/L (ref 11–51)

## 2016-04-02 NOTE — ED Provider Notes (Signed)
[facility] Emergency Department Provider Note  ____________________________________________  Time seen: ~0125  I have reviewed the triage vital signs and the nursing notes.   HISTORY  Chief Complaint Rectal Bleeding and Abdominal Pain   History limited by: Not Limited   HPI Howard [name] is a 35 y.o. male who presents to the emergency department today because of concerns forabdominal pain and possible GI bleeding. Patient states that he was bending over helping a plumber when he had acute onset of pain. It is located in the right upper quadrant. It is sharp. It has been constant since it started. He says shortly after that he had a bowel movement which he was concerned had some blood in it. He only had 1 bowel movement. Has not had any further bleeding. Has had GI bleeds in the past although he states the pain is new. Prior to bending over and the onset of the pain patient states he was in his normal state of health.   Past Medical History  Diagnosis Date  . Hypertension   . Schizophrenia (HCC)   . Hyperlipemia   . Depression   . Diabetes (HCC)   . Liver disease   . Diabetes mellitus, type II (HCC)   . Heart disease   . Alcoholic cirrhosis of liver with ascites (HCC)   . Esophageal varices (HCC)   . Portal hypertensive gastropathy   . COPD (chronic obstructive pulmonary disease) [facility])     Patient Active Problem List   Diagnosis Date Noted  . Acute hepatic encephalopathy (HCC) 03/14/2016  . Controlled type 2 diabetes mellitus without complication (HCC) 01/15/2016  . Normocytic anemia 12/12/2015  . Essential (primary) hypertension 12/11/2015  . Pure hypercholesterolemia 12/11/2015  . Fever 11/11/2015  . Ascites due to alcoholic cirrhosis (HCC) 11/11/2015  . Cirrhosis of liver with ascites (HCC) 11/11/2015  . Hepatic encephalopathy (HCC) 10/16/2015  . Type 2 diabetes mellitus (HCC) 10/16/2015  . Acute renal failure (ARF) (HCC) 07/30/2015  .  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 Alcoholic cirrhosis (HCC) 07/19/2015  . Hypoxia 07/04/2015  . Elevated transaminase level 07/04/2015  . DOE (dyspnea on exertion) 07/04/2015  . Alcohol abuse 05/31/2015  . Hypertension 05/29/2015  . Paranoid schizophrenia (HCC) 03/20/2015    Past Surgical History  Procedure Laterality Date  . No past surgeries    . Esophagogastroduodenoscopy (egd) with propofol N/A 08/03/2015    Procedure: ESOPHAGOGASTRODUODENOSCOPY (EGD) WITH PROPOFOL;  Surgeon: Elnita Maxwell, MD;  Location[facility] ENDOSCOPY;  Service: Endoscopy;  Laterality: N/A;  . Esophagogastroduodenoscopy (egd) with propofol N/A 08/31/2015    Procedure: ESOPHAGOGASTRODUODENOSCOPY (EGD) WITH PROPOFOL;  Surgeon: Elnita Maxwell, MD;  Location: [facility] ENDOSCOPY;  Service: Endoscopy;  Laterality: N/A;  . Esophagogastroduodenoscopy N/A 10/05/2015    Procedure: ESOPHAGOGASTRODUODENOSCOPY (EGD);  Surgeon: Elnita Maxwell, MD;  Location: [facility] ENDOSCOPY;  Service: Endoscopy;  Laterality: N/A;    Current Outpatient Rx  Name  Route  Sig  Dispense  Refill  . albuterol (PROVENTIL HFA;VENTOLIN HFA) 108 (90 BASE) MCG/ACT inhaler   Inhalation   Inhale 2 puffs into the lungs every 6 (six) hours as needed for wheezing or shortness of breath. Reported on 11/11/2015         . aspirin EC 81 MG tablet   Oral   Take 81 mg by mouth daily.          . citalopram (CELEXA) 20 MG tablet   Oral   Take 1 tablet (20 mg  total) by mouth daily.   90 tablet   1   . furosemide (LASIX) 20 MG tablet   Oral   Take 1 tablet (20 mg total) by mouth 2 (two) times daily.   30 tablet   2   . lactulose (CHRONULAC) 10 GM/15ML solution   Oral   Take 45 mLs (30 g total) by mouth 3 (three) times daily.   236 mL   5   . losartan (COZAAR) 100 MG tablet   Oral   Take 100 mg by mouth daily.         . metFORMIN (GLUCOPHAGE) 500 MG tablet   Oral   Take 1 tablet (500 mg total) by mouth 2  (two) times daily with a meal.   30 tablet   0   . metoCLOPramide (REGLAN) 10 MG tablet   Oral   Take 1 tablet (10 mg total) by mouth 3 (three) times daily before meals.   20 tablet   0   . metoprolol tartrate (LOPRESSOR) 25 MG tablet   Oral   Take 25 mg by mouth 2 (two) times daily.         . Multiple Vitamin (MULTIVITAMIN WITH MINERALS) TABS tablet   Oral   Take 1 tablet by mouth daily.   30 tablet   0   . nadolol (CORGARD) 40 MG tablet   Oral   Take 1 tablet (40 mg total) by mouth daily.   30 tablet   2   . OLANZapine (ZYPREXA) 20 MG tablet   Oral   Take 1 tablet (20 mg total) by mouth at bedtime.   90 tablet   1   . omeprazole (PRILOSEC) 40 MG capsule   Oral   Take 1 capsule (40 mg total) by mouth every morning.   30 capsule   2   . ondansetron (ZOFRAN) 4 MG tablet   Oral   Take 1 tablet (4 mg total) by mouth daily as needed for nausea or vomiting.   20 tablet   1   . rifaximin (XIFAXAN) 550 MG TABS tablet   Oral   Take 1 tablet (550 mg total) by mouth 2 (two) times daily.   60 tablet   2   . spironolactone (ALDACTONE) 25 MG tablet   Oral   Take 1 tablet (25 mg total) by mouth daily.   30 tablet   2     Allergies Review of patient's allergies indicates no known allergies.  Family History  Problem Relation Age of Onset  . Heart disease Mother   . Hypertension Mother   . Hyperlipidemia Mother   . Stroke Father   . Heart attack Father   . Hypertension Father   . Heart disease Father   . Alcohol abuse Father   . Heart disease Brother     Social History Social History  Substance Use Topics  . Smoking status: Never Smoker   . Smokeless tobacco: Never Used  . Alcohol Use: 0.0 oz/week    0 Shots of liquor, 0 Standard drinks or equivalent per week     Comment: last drink 02/09/16    Review of Systems  Constitutional: Negative for fever. Cardiovascular: Negative for chest pain. Respiratory: Negative for shortness of  breath. Gastrointestinal: Positive for abdominal pain Neurological: Negative for headaches, focal weakness or numbness.  10-point ROS otherwise negative.  ____________________________________________   PHYSICAL EXAM:  VITAL SIGNS: ED Triage Vitals  Enc Vitals Group     BP 04/01/16  2043 121/69 mmHg     Pulse Rate 04/01/16 2043 92     Resp 04/01/16 2043 20     Temp 04/01/16 2043 98.9 F (37.2 C)     Temp Source 04/01/16 2043 Oral     SpO2 04/01/16 2043 95 %     Weight 04/01/16 2043 332 lb (150.594 kg)     Height 04/01/16 2043 6\' 3"  (1.905 m)     Head Cir --      Peak Flow --      Pain Score 04/01/16 2044 9   Constitutional: Alert and oriented. Well appearing and in no distress. Eyes: Conjunctivae are normal. PERRL. Normal extraocular movements. ENT   Head: Normocephalic and atraumatic.   Nose: No congestion/rhinnorhea.   Mouth/Throat: Mucous membranes are moist.   Neck: No stridor. Hematological/Lymphatic/Immunilogical: No cervical lymphadenopathy. Cardiovascular: Normal rate, regular rhythm.  No murmurs, rubs, or gallops. Respiratory: Normal respiratory effort without tachypnea nor retractions. Breath sounds are clear and equal bilaterally. No wheezes/rales/rhonchi. Gastrointestinal: Soft and Minimally tender in the right upper quadrant. No distention.  Genitourinary: Deferred Musculoskeletal: Normal range of motion in all extremities. No joint effusions.  No lower extremity tenderness nor edema. Neurologic:  Normal speech and language. No gross focal neurologic deficits are appreciated.  Skin:  Skin is warm, dry and intact. No rash noted. Psychiatric: Mood and affect are normal. Speech and behavior are normal. Patient exhibits appropriate insight and judgment.  ____________________________________________    LABS (pertinent positives/negatives)  Labs Reviewed  COMPREHENSIVE METABOLIC PANEL - Abnormal; Notable for the following:    Glucose, Bld 112 (*)     Calcium 8.8 (*)    All other components within normal limits  CBC - Abnormal; Notable for the following:    RBC 3.34 (*)    Hemoglobin 9.6 (*)    HCT 29.0 (*)    RDW 17.2 (*)    Platelets 141 (*)    All other components within normal limits  LIPASE, BLOOD  POC OCCULT BLOOD, ED  TYPE AND SCREEN     ____________________________________________   EKG  None  ____________________________________________    RADIOLOGY  RUQ US IMPRESSION: Changes of hepatic cirrhosis without focal lesion identified. No evidence of cholelithiasis or cholecystitis.  ____________________________________________   PROCEDURES  Procedure(s) performed: None  Critical Care performed: No  ____________________________________________   INITIAL IMPRESSION / ASSESSMENT AND PLAN / ED COURSE  Pertinent labs & imaging results that were available during my care of the patient were reviewed by me and considered in my medical decision making (see chart for details).  Patient presented to the emergency department today with concerns for right upper quadrant abdominal pain after bending over and a concerns for GI bleed. Patient does have a history of GI bleeds and was seen in the emergency department recently for the same. He only had one episode of bleeding. On exam patient is mildly tender in the right upper quadrant. No leukocytosis. Will get an ultrasound to evaluate for any gallbladder or liver disease. At this point I doubt intra-abdominal infection given lack of fever or leukocytosis. ----------------------------------------- 4:42 AM on 04/02/2016 -----------------------------------------  Patient's ultrasound without any concerning findings. Additionally patient hemoglobin is stable from previous trauma. Patient's vital signs remained without tachycardia or hypotension here in the emergency department. At my reevaluation at this time patient states that his pain has resolved. He does have an  appointment with his GI doctor in 2 days. Did discuss return precautions.  ____________________________________________   FINAL CLINICAL  IMPRESSION(S) / ED DIAGNOSES  Final diagnoses:  Pain  Abdominal pain, unspecified abdominal location  Gastrointestinal hemorrhage, unspecified gastritis, unspecified gastrointestinal hemorrhage type     Note: This dictation was prepared with Dragon dictation. Any transcriptional errors that result from this process are unintentional    Phineas Semen, MD 04/02/16 [phone]

## 2016-04-02 NOTE — Discharge Instructions (Signed)
Please seek medical attention for any high fevers, chest pain, shortness of breath, change in behavior, persistent vomiting, bloody stool or any other new or concerning symptoms. ° ° °Abdominal Pain, Adult °Many things can cause belly (abdominal) pain. Most times, the belly pain is not dangerous. Many cases of belly pain can be watched and treated at home. °HOME CARE  °· Do not take medicines that help you go poop (laxatives) unless told to by your doctor. °· Only take medicine as told by your doctor. °· Eat or drink as told by your doctor. Your doctor will tell you if you should be on a special diet. °GET HELP IF: °· You do not know what is causing your belly pain. °· You have belly pain while you are sick to your stomach (nauseous) or have runny poop (diarrhea). °· You have pain while you pee or poop. °· Your belly pain wakes you up at night. °· You have belly pain that gets worse or better when you eat. °· You have belly pain that gets worse when you eat fatty foods. °· You have a fever. °GET HELP RIGHT AWAY IF:  °· The pain does not go away within 2 hours. °· You keep throwing up (vomiting). °· The pain changes and is only in the right or left part of the belly. °· You have bloody or tarry looking poop. °MAKE SURE YOU:  °· Understand these instructions. °· Will watch your condition. °· Will get help right away if you are not doing well or get worse. °  °This information is not intended to replace advice given to you by your health care provider. Make sure you discuss any questions you have with your health care provider. °  °Document Released: 02/25/2008 Document Revised: 09/29/2014 Document Reviewed: 05/18/2013 °Elsevier Interactive Patient Education ©2016 [employer] ° °

## 2016-04-03 ENCOUNTER — Encounter: Payer: Self-pay | Admitting: *Deleted

## 2016-04-03 ENCOUNTER — Other Ambulatory Visit: Payer: Self-pay | Admitting: Psychiatry

## 2016-04-03 DIAGNOSIS — I8500 Esophageal varices without bleeding: Secondary | ICD-10-CM | POA: Diagnosis present

## 2016-04-03 DIAGNOSIS — I8510 Secondary esophageal varices without bleeding: Secondary | ICD-10-CM | POA: Diagnosis not present

## 2016-04-03 DIAGNOSIS — E785 Hyperlipidemia, unspecified: Secondary | ICD-10-CM | POA: Diagnosis not present

## 2016-04-03 DIAGNOSIS — Z79899 Other long term (current) drug therapy: Secondary | ICD-10-CM | POA: Diagnosis not present

## 2016-04-03 DIAGNOSIS — Z7984 Long term (current) use of oral hypoglycemic drugs: Secondary | ICD-10-CM | POA: Diagnosis not present

## 2016-04-03 DIAGNOSIS — I10 Essential (primary) hypertension: Secondary | ICD-10-CM | POA: Diagnosis not present

## 2016-04-03 DIAGNOSIS — Z823 Family history of stroke: Secondary | ICD-10-CM | POA: Diagnosis not present

## 2016-04-03 DIAGNOSIS — J449 Chronic obstructive pulmonary disease, unspecified: Secondary | ICD-10-CM | POA: Diagnosis not present

## 2016-04-03 DIAGNOSIS — Z6841 Body Mass Index (BMI) 40.0 and over, adult: Secondary | ICD-10-CM | POA: Diagnosis not present

## 2016-04-03 DIAGNOSIS — K3189 Other diseases of stomach and duodenum: Secondary | ICD-10-CM | POA: Diagnosis not present

## 2016-04-03 DIAGNOSIS — E119 Type 2 diabetes mellitus without complications: Secondary | ICD-10-CM | POA: Diagnosis not present

## 2016-04-03 DIAGNOSIS — Z8249 Family history of ischemic heart disease and other diseases of the circulatory system: Secondary | ICD-10-CM | POA: Diagnosis not present

## 2016-04-03 DIAGNOSIS — F209 Schizophrenia, unspecified: Secondary | ICD-10-CM | POA: Diagnosis not present

## 2016-04-03 DIAGNOSIS — F329 Major depressive disorder, single episode, unspecified: Secondary | ICD-10-CM | POA: Diagnosis not present

## 2016-04-03 DIAGNOSIS — K766 Portal hypertension: Secondary | ICD-10-CM | POA: Diagnosis not present

## 2016-04-03 DIAGNOSIS — Z7982 Long term (current) use of aspirin: Secondary | ICD-10-CM | POA: Diagnosis not present

## 2016-04-03 DIAGNOSIS — K2970 Gastritis, unspecified, without bleeding: Secondary | ICD-10-CM | POA: Diagnosis not present

## 2016-04-03 DIAGNOSIS — Z811 Family history of alcohol abuse and dependence: Secondary | ICD-10-CM | POA: Diagnosis not present

## 2016-04-03 DIAGNOSIS — D649 Anemia, unspecified: Secondary | ICD-10-CM | POA: Diagnosis not present

## 2016-04-03 DIAGNOSIS — K7030 Alcoholic cirrhosis of liver without ascites: Secondary | ICD-10-CM | POA: Diagnosis not present

## 2016-04-04 ENCOUNTER — Encounter
Admission: RE | Disposition: A | Discharge status: Home / Self Care | Payer: Self-pay | Source: Ambulatory Visit | Attending: Unknown Physician Specialty

## 2016-04-04 ENCOUNTER — Ambulatory Visit
Admission: RE | Admit: 2016-04-04 | Discharge: 2016-04-04 | Disposition: A | Discharge status: Home / Self Care | Payer: Medicare Other | Source: Ambulatory Visit | Attending: Unknown Physician Specialty | Admitting: Unknown Physician Specialty

## 2016-04-04 ENCOUNTER — Ambulatory Visit: Payer: Medicare Other | Admitting: Certified Registered Nurse Anesthetist

## 2016-04-04 ENCOUNTER — Encounter: Payer: Self-pay | Admitting: *Deleted

## 2016-04-04 DIAGNOSIS — Z823 Family history of stroke: Secondary | ICD-10-CM | POA: Insufficient documentation

## 2016-04-04 DIAGNOSIS — K3189 Other diseases of stomach and duodenum: Secondary | ICD-10-CM | POA: Insufficient documentation

## 2016-04-04 DIAGNOSIS — K7030 Alcoholic cirrhosis of liver without ascites: Secondary | ICD-10-CM | POA: Diagnosis not present

## 2016-04-04 DIAGNOSIS — D649 Anemia, unspecified: Secondary | ICD-10-CM | POA: Insufficient documentation

## 2016-04-04 DIAGNOSIS — K766 Portal hypertension: Secondary | ICD-10-CM | POA: Insufficient documentation

## 2016-04-04 DIAGNOSIS — I10 Essential (primary) hypertension: Secondary | ICD-10-CM | POA: Insufficient documentation

## 2016-04-04 DIAGNOSIS — Z811 Family history of alcohol abuse and dependence: Secondary | ICD-10-CM | POA: Insufficient documentation

## 2016-04-04 DIAGNOSIS — I8510 Secondary esophageal varices without bleeding: Secondary | ICD-10-CM | POA: Insufficient documentation

## 2016-04-04 DIAGNOSIS — J449 Chronic obstructive pulmonary disease, unspecified: Secondary | ICD-10-CM | POA: Insufficient documentation

## 2016-04-04 DIAGNOSIS — F329 Major depressive disorder, single episode, unspecified: Secondary | ICD-10-CM | POA: Insufficient documentation

## 2016-04-04 DIAGNOSIS — Z7982 Long term (current) use of aspirin: Secondary | ICD-10-CM | POA: Insufficient documentation

## 2016-04-04 DIAGNOSIS — Z6841 Body Mass Index (BMI) 40.0 and over, adult: Secondary | ICD-10-CM | POA: Insufficient documentation

## 2016-04-04 DIAGNOSIS — K2970 Gastritis, unspecified, without bleeding: Secondary | ICD-10-CM | POA: Insufficient documentation

## 2016-04-04 DIAGNOSIS — F209 Schizophrenia, unspecified: Secondary | ICD-10-CM | POA: Insufficient documentation

## 2016-04-04 DIAGNOSIS — E785 Hyperlipidemia, unspecified: Secondary | ICD-10-CM | POA: Insufficient documentation

## 2016-04-04 DIAGNOSIS — Z8249 Family history of ischemic heart disease and other diseases of the circulatory system: Secondary | ICD-10-CM | POA: Insufficient documentation

## 2016-04-04 DIAGNOSIS — Z7984 Long term (current) use of oral hypoglycemic drugs: Secondary | ICD-10-CM | POA: Insufficient documentation

## 2016-04-04 DIAGNOSIS — E119 Type 2 diabetes mellitus without complications: Secondary | ICD-10-CM | POA: Insufficient documentation

## 2016-04-04 DIAGNOSIS — Z79899 Other long term (current) drug therapy: Secondary | ICD-10-CM | POA: Insufficient documentation

## 2016-04-04 HISTORY — DX: Nontoxic multinodular goiter: E04.2

## 2016-04-04 HISTORY — PX: ESOPHAGOGASTRODUODENOSCOPY (EGD) WITH PROPOFOL: SHX5813

## 2016-04-04 HISTORY — DX: Anemia, unspecified: D64.9

## 2016-04-04 LAB — GLUCOSE, CAPILLARY
GLUCOSE-CAPILLARY: 59 mg/dL — AB (ref 65–99)
GLUCOSE-CAPILLARY: 78 mg/dL (ref 65–99)
Glucose-Capillary: 151 mg/dL — ABNORMAL HIGH (ref 65–99)

## 2016-04-04 SURGERY — ESOPHAGOGASTRODUODENOSCOPY (EGD) WITH PROPOFOL
Anesthesia: General

## 2016-04-04 MED ORDER — DEXTROSE 50 % IV SOLN
12.5000 g | Freq: Once | INTRAVENOUS | Status: AC
  Administered 2016-04-04: 12.5 g via INTRAVENOUS

## 2016-04-04 MED ORDER — SODIUM CHLORIDE 0.9 % IV SOLN: INTRAVENOUS | Status: DC

## 2016-04-04 MED ORDER — PROPOFOL 10 MG/ML IV BOLUS
INTRAVENOUS | Status: DC | PRN
  Administered 2016-04-04: 80 mg via INTRAVENOUS
  Administered 2016-04-04: 20 mg via INTRAVENOUS

## 2016-04-04 MED ORDER — LIDOCAINE HCL (CARDIAC) 20 MG/ML IV SOLN
INTRAVENOUS | Status: DC | PRN
  Administered 2016-04-04: 100 mg via INTRAVENOUS

## 2016-04-04 MED ORDER — FENTANYL CITRATE (PF) 100 MCG/2ML IJ SOLN
INTRAMUSCULAR | Status: DC | PRN
  Administered 2016-04-04: 50 ug via INTRAVENOUS

## 2016-04-04 MED ORDER — PROPOFOL 500 MG/50ML IV EMUL
INTRAVENOUS | Status: DC | PRN
  Administered 2016-04-04: 160 ug/kg/min via INTRAVENOUS

## 2016-04-04 MED ORDER — SODIUM CHLORIDE 0.9 % IV SOLN
INTRAVENOUS | Status: DC
  Administered 2016-04-04: 1000 mL via INTRAVENOUS

## 2016-04-04 NOTE — Anesthesia Postprocedure Evaluation (Signed)
Anesthesia Post Note  Patient: Howard [name]  Procedure(s) Performed: Procedure(s) (LRB): ESOPHAGOGASTRODUODENOSCOPY (EGD) WITH PROPOFOL (N/A)  Patient location during evaluation: Endoscopy Anesthesia Type: General Level of consciousness: awake and alert Pain management: pain level controlled Vital Signs Assessment: post-procedure vital signs reviewed and stable Respiratory status: spontaneous breathing, nonlabored ventilation, respiratory function stable and patient connected to nasal cannula oxygen Cardiovascular status: blood pressure returned to baseline and stable Postop Assessment: no signs of nausea or vomiting Anesthetic complications: no    Last Vitals:  Filed Vitals:   04/04/16 0940 04/04/16 0950  BP: 154/94 159/96  Pulse: 87 86  Temp:    Resp: 16 17    Last Pain: There were no vitals filed for this visit.               Lenard SimmerAndrew Kawika Bischoff

## 2016-04-04 NOTE — Anesthesia Preprocedure Evaluation (Signed)
Anesthesia Evaluation  Patient identified by MRN, date of birth, ID band Patient awake    Reviewed: Allergy & Precautions, NPO status , Patient's Chart, lab work & pertinent test results  History of Anesthesia Complications Negative for: history of anesthetic complications  Airway Mallampati: III       Dental  (+) Poor Dentition, Chipped   Pulmonary neg pulmonary ROS, shortness of breath, COPD,  COPD inhaler,           Cardiovascular hypertension, Pt. on medications and Pt. on home beta blockers + DOE       Neuro/Psych PSYCHIATRIC DISORDERS (Depression and schizophrenia) negative neurological ROS     GI/Hepatic Neg liver ROS, (+) Cirrhosis   Esophageal Varices  substance abuse (history, but quit drinking)  alcohol use,   Endo/Other  diabetes, Oral Hypoglycemic AgentsMorbid obesity  Renal/GU negative Renal ROS     Musculoskeletal   Abdominal   Peds  Hematology  (+) Blood dyscrasia, anemia ,   Anesthesia Other Findings   Reproductive/Obstetrics                             Anesthesia Physical  Anesthesia Plan  ASA: III  Anesthesia Plan: General   Post-op Pain Management:    Induction: Intravenous  Airway Management Planned: Nasal Cannula  Additional Equipment:   Intra-op Plan:   Post-operative Plan:   Informed Consent: I have reviewed the patients History and Physical, chart, labs and discussed the procedure including the risks, benefits and alternatives for the proposed anesthesia with the patient or authorized representative who has indicated his/her understanding and acceptance.     Plan Discussed with: Anesthesiologist and CRNA  Anesthesia Plan Comments:         Anesthesia Quick Evaluation

## 2016-04-04 NOTE — H&P (Signed)
Primary Care Physician:  Marisue Ivan, MD Primary Gastroenterologist:  Dr. Mechele Collin  Pre-Procedure History & Physical: HPI:  Howard [name] is a 35 y.o. male is here for an endoscopy.   Past Medical History  Diagnosis Date  . Hypertension   . Schizophrenia (HCC)   . Hyperlipemia   . Depression   . Diabetes (HCC)   . Liver disease   . Diabetes mellitus, type II (HCC)   . Heart disease   . Alcoholic cirrhosis of liver with ascites (HCC)   . Esophageal varices (HCC)   . Portal hypertensive gastropathy   . COPD (chronic obstructive pulmonary disease) (HCC)   . Anemia   . Multiple thyroid nodules     Past Surgical History  Procedure Laterality Date  . No past surgeries    . Esophagogastroduodenoscopy (egd) with propofol N/A 08/03/2015    Procedure: ESOPHAGOGASTRODUODENOSCOPY (EGD) WITH PROPOFOL;  Surgeon: Elnita Maxwell, MD;  Location: [facility] ENDOSCOPY;  Service: Endoscopy;  Laterality: N/A;  . Esophagogastroduodenoscopy (egd) with propofol N/A 08/31/2015    Procedure: ESOPHAGOGASTRODUODENOSCOPY (EGD) WITH PROPOFOL;  Surgeon: Elnita Maxwell, MD;  Location: [facility] ENDOSCOPY;  Service: Endoscopy;  Laterality: N/A;  . Esophagogastroduodenoscopy N/A 10/05/2015    Procedure: ESOPHAGOGASTRODUODENOSCOPY (EGD);  Surgeon: Elnita Maxwell, MD;  Location: [facility] ENDOSCOPY;  Service: Endoscopy;  Laterality: N/A;    Prior to Admission medications   Medication Sig Start Date End Date Taking? Authorizing Provider  albuterol (PROVENTIL HFA;VENTOLIN HFA) 108 (90 BASE) MCG/ACT inhaler Inhale 2 puffs into the lungs every 6 (six) hours as needed for wheezing or shortness of breath. Reported on 11/11/2015   Yes Historical Provider, MD  aspirin EC 81 MG tablet Take 81 mg by mouth daily.    Yes Historical Provider, MD  citalopram (CELEXA) 20 MG tablet Take 1 tablet (20 mg total) by mouth daily. 01/15/16  Yes Audery Amel, MD  furosemide (LASIX) 20 MG tablet Take 1 tablet (20 mg total) by  mouth 2 (two) times daily. 02/12/16  Yes Enid Baas, MD  lactulose (CHRONULAC) 10 GM/15ML solution Take 45 mLs (30 g total) by mouth 3 (three) times daily. 03/16/16  Yes Auburn Bilberry, MD  losartan (COZAAR) 100 MG tablet Take 100 mg by mouth daily.   Yes Historical Provider, MD  metFORMIN (GLUCOPHAGE) 500 MG tablet Take 1 tablet (500 mg total) by mouth 2 (two) times daily with a meal. 11/13/15  Yes Katha Hamming, MD  metoCLOPramide (REGLAN) 10 MG tablet Take 1 tablet (10 mg total) by mouth 3 (three) times daily before meals. 11/05/15 11/04/16 Yes Rebecka Apley, MD  metoprolol tartrate (LOPRESSOR) 25 MG tablet Take 25 mg by mouth 2 (two) times daily.   Yes Historical Provider, MD  Multiple Vitamin (MULTIVITAMIN WITH MINERALS) TABS tablet Take 1 tablet by mouth daily. 08/04/15  Yes Ramonita Lab, MD  nadolol (CORGARD) 40 MG tablet Take 1 tablet (40 mg total) by mouth daily. 02/12/16  Yes Enid Baas, MD  OLANZapine (ZYPREXA) 20 MG tablet Take 1 tablet (20 mg total) by mouth at bedtime. 03/13/16  Yes Audery Amel, MD  omeprazole (PRILOSEC) 40 MG capsule Take 1 capsule (40 mg total) by mouth every morning. 02/12/16  Yes Enid Baas, MD  ondansetron (ZOFRAN) 4 MG tablet Take 1 tablet (4 mg total) by mouth daily as needed for nausea or vomiting. 11/20/15  Yes Jene Every, MD  rifaximin (XIFAXAN) 550 MG TABS tablet Take 1 tablet (550 mg total) by mouth 2 (two) times  daily. 02/12/16  Yes Enid Baasadhika Kalisetti, MD  spironolactone (ALDACTONE) 25 MG tablet Take 1 tablet (25 mg total) by mouth daily. 02/12/16  Yes Enid Baasadhika Kalisetti, MD    Allergies as of 03/31/2016  . (No Known Allergies)    Family History  Problem Relation Age of Onset  . Heart disease Mother   . Hypertension Mother   . Hyperlipidemia Mother   . Stroke Father   . Heart attack Father   . Hypertension Father   . Heart disease Father   . Alcohol abuse Father   . Heart disease Brother     Social History   Social  History  . Marital Status: Single    Spouse Name: N/A  . Number of Children: N/A  . Years of Education: N/A   Occupational History  . disabled    Social History Main Topics  . Smoking status: Never Smoker   . Smokeless tobacco: Never Used  . Alcohol Use: No     Comment: last drink 02/09/16  . Drug Use: No  . Sexual Activity: No   Other Topics Concern  . Not on file   Social History Narrative   ** Merged History Encounter **        Review of Systems: See HPI, otherwise negative ROS  Physical Exam: BP 142/89 mmHg  Pulse 80  Temp(Src) 98.8 F (37.1 C) (Tympanic)  Resp 18  Ht 6\' 3"  (1.905 m)  Wt 150.594 kg (332 lb)  BMI 41.50 kg/m2  SpO2 96% General:   Alert,  pleasant and cooperative in NAD Head:  Normocephalic and atraumatic. Neck:  Supple; no masses or thyromegaly. Lungs:  Clear throughout to auscultation.    Heart:  Regular rate and rhythm. Abdomen:  Soft, nontender and nondistended. Normal bowel sounds, without guarding, and without rebound.  Obese. Neurologic:  Alert and  oriented x4;  grossly normal neurologically.  Impression/Plan: Howard [name] is here for an endoscopy to be performed for esophageal varices  Risks, benefits, limitations, and alternatives regarding  endoscopy have been reviewed with the patient.  Questions have been answered.  All parties agreeable.   Lynnae PrudeELLIOTT, ROBERT, MD  04/04/2016, 8:56 AM

## 2016-04-04 NOTE — Transfer of Care (Signed)
Immediate Anesthesia Transfer of Care Note  Patient: Howard [name]  Procedure(s) Performed: Procedure(s): ESOPHAGOGASTRODUODENOSCOPY (EGD) WITH PROPOFOL (N/A)  Patient Location: PACU  Anesthesia Type:General  Level of Consciousness: sedated  Airway & Oxygen Therapy: Patient Spontanous Breathing and Patient connected to nasal cannula oxygen  Post-op Assessment: Report given to RN and Post -op Vital signs reviewed and stable  Post vital signs: Reviewed and stable  Last Vitals:  Filed Vitals:   04/04/16 0801  BP: 142/89  Pulse: 80  Temp: 37.1 C  Resp: 18    Last Pain: There were no vitals filed for this visit.       Complications: No apparent anesthesia complications

## 2016-04-04 NOTE — Op Note (Signed)
[facility] Gastroenterology Patient Name: Howard BowlerJoey [name] Procedure Date: 04/04/2016 8:57 AM MRN: [id number] Account #: [id number] Date of Birth: 04/25/1981 Admit Type: Outpatient Age: [age] [facility] ROOM 3 Gender: Male Note Status: Finalized Procedure:            Upper GI endoscopy Indications:          Follow-up of esophageal varices, For therapy of                        esophageal varices Providers:            Scot Junobert T. Elliott, MD Referring MD:         Marisue IvanKanhka [name] (Referring MD) Medicines:            Propofol per Anesthesia Complications:        No immediate complications. Procedure:            Pre-Anesthesia Assessment:                       - After reviewing the risks and benefits, the patient                        was deemed in satisfactory condition to undergo the                        procedure.                       After obtaining informed consent, the endoscope was                        passed under direct vision. Throughout the procedure,                        the patient's blood pressure, pulse, and oxygen                        saturations were monitored continuously. The Endoscope                        was introduced through the mouth, and advanced to the                        second part of duodenum. The upper GI endoscopy was                        accomplished without difficulty. The patient tolerated                        the procedure well. Findings:      Grade II varices were found in the lower third of the esophagus. They       were 5 mm in largest diameter. Four bands were successfully placed with       complete eradication, resulting in deflation of varices. There was no       bleeding during, and at the end, of the procedure.      Diffuse and patchy moderate inflammation characterized by erythema and       granularity was found in the gastric antrum.      The examined duodenum was normal. Impression:           -  Grade II  esophageal varices. Completely eradicated.                        Banded.                       - Gastritis.                       - Normal examined duodenum.                       - No specimens collected. Recommendation:       - Repeat upper endoscopy in 4 months for surveillance. Procedure Code(s):    --- Professional ---                       [phone], Esophagogastroduodenoscopy, flexible, transoral;                        with band ligation of esophageal/gastric varices Diagnosis Code(s):    --- Professional ---                       I85.00, Esophageal varices without bleeding                       K29.70, Gastritis, unspecified, without bleeding CPT copyright 2016 American Medical Association. All rights reserved. The codes documented in this report are preliminary and upon coder review may  be revised to meet current compliance requirements. Scot Jun, MD 04/04/2016 9:20:02 AM This report has been signed electronically. Number of Addenda: 0 Note Initiated On: 04/04/2016 8:57 AM      [facility]

## 2016-04-06 ENCOUNTER — Emergency Department
Admission: EM | Admit: 2016-04-06 | Discharge: 2016-04-07 | Disposition: A | Discharge status: Home / Self Care | Payer: Medicare Other | Attending: Emergency Medicine | Admitting: Emergency Medicine

## 2016-04-06 ENCOUNTER — Encounter: Payer: Self-pay | Admitting: Unknown Physician Specialty

## 2016-04-06 DIAGNOSIS — E785 Hyperlipidemia, unspecified: Secondary | ICD-10-CM | POA: Insufficient documentation

## 2016-04-06 DIAGNOSIS — R1013 Epigastric pain: Secondary | ICD-10-CM | POA: Insufficient documentation

## 2016-04-06 DIAGNOSIS — R079 Chest pain, unspecified: Secondary | ICD-10-CM | POA: Insufficient documentation

## 2016-04-06 DIAGNOSIS — F200 Paranoid schizophrenia: Secondary | ICD-10-CM | POA: Diagnosis not present

## 2016-04-06 DIAGNOSIS — J449 Chronic obstructive pulmonary disease, unspecified: Secondary | ICD-10-CM | POA: Diagnosis not present

## 2016-04-06 DIAGNOSIS — I10 Essential (primary) hypertension: Secondary | ICD-10-CM | POA: Insufficient documentation

## 2016-04-06 DIAGNOSIS — F329 Major depressive disorder, single episode, unspecified: Secondary | ICD-10-CM | POA: Insufficient documentation

## 2016-04-06 DIAGNOSIS — Z7984 Long term (current) use of oral hypoglycemic drugs: Secondary | ICD-10-CM | POA: Diagnosis not present

## 2016-04-06 DIAGNOSIS — E119 Type 2 diabetes mellitus without complications: Secondary | ICD-10-CM | POA: Diagnosis not present

## 2016-04-06 DIAGNOSIS — Z79899 Other long term (current) drug therapy: Secondary | ICD-10-CM | POA: Diagnosis not present

## 2016-04-06 DIAGNOSIS — Z792 Long term (current) use of antibiotics: Secondary | ICD-10-CM | POA: Insufficient documentation

## 2016-04-06 LAB — TYPE AND SCREEN
ABO/RH(D): O POS
ANTIBODY SCREEN: NEGATIVE

## 2016-04-06 LAB — COMPREHENSIVE METABOLIC PANEL
ALBUMIN: 3.8 g/dL (ref 3.5–5.0)
ALT: 31 U/L (ref 17–63)
ANION GAP: 7 (ref 5–15)
AST: 35 U/L (ref 15–41)
Alkaline Phosphatase: 91 U/L (ref 38–126)
BUN: 12 mg/dL (ref 6–20)
CO2: 24 mmol/L (ref 22–32)
Calcium: 8.8 mg/dL — ABNORMAL LOW (ref 8.9–10.3)
Chloride: 108 mmol/L (ref 101–111)
Creatinine, Ser: 0.84 mg/dL (ref 0.61–1.24)
GFR calc Af Amer: >60 mL/min (ref >60)
GFR calc non Af Amer: >60 mL/min (ref >60)
GLUCOSE: 110 mg/dL — AB (ref 65–99)
POTASSIUM: 3.9 mmol/L (ref 3.5–5.1)
SODIUM: 139 mmol/L (ref 135–145)
TOTAL PROTEIN: 7.5 g/dL (ref 6.5–8.1)
Total Bilirubin: 0.9 mg/dL (ref 0.3–1.2)

## 2016-04-06 LAB — CBC
HEMATOCRIT: 30.3 % — AB (ref 40.0–52.0)
HEMOGLOBIN: 9.9 g/dL — AB (ref 13.0–18.0)
MCH: 28.1 pg (ref 26.0–34.0)
MCHC: 32.6 g/dL (ref 32.0–36.0)
MCV: 86.2 fL (ref 80.0–100.0)
Platelets: 137 10*3/uL — ABNORMAL LOW (ref 150–440)
RBC: 3.52 MIL/uL — ABNORMAL LOW (ref 4.40–5.90)
RDW: 16.6 % — ABNORMAL HIGH (ref 11.5–14.5)
WBC: 6.8 10*3/uL (ref 3.8–10.6)

## 2016-04-06 LAB — TROPONIN I

## 2016-04-06 MED ORDER — SUCRALFATE 1 G PO TABS: 1.0000 g | ORAL_TABLET | Freq: Four times a day (QID) | ORAL | Status: DC

## 2016-04-06 MED ORDER — GI COCKTAIL ~~LOC~~
30.0000 mL | Freq: Once | ORAL | Status: AC
  Administered 2016-04-06: 30 mL via ORAL
  Filled 2016-04-06: qty 30

## 2016-04-06 NOTE — ED Notes (Signed)
Pt. States he had endoscopy on Friday, pt. States 3 bands were placed that day..  Pt. States at 5 pm today he had black stools.  Pt. Also states he dry heaved about 6 times today before coming to ED.

## 2016-04-06 NOTE — ED Provider Notes (Signed)
[facility] Emergency Department Provider Note    ____________________________________________  Time seen: ~2210  I have reviewed the triage vital signs and the nursing notes.   HISTORY  Chief Complaint GI Bleeding   History limited by: Not Limited   HPI Howard [name] is a 35 y.o. male who presents to the emergency department today because of concern for chest pain, epigastric pain and some dark stools. Patient has been having dark stools for a few weeks and was seen in the emergency department a few days ago for that symptoms. In the interim he did have an upper endoscopy performed with banding of esophageal varices. Since that time he has developed pain in his chest and epigastrium. Mother thinks that they might have dried out his esophagus too much. He denies any fevers.   Past Medical History  Diagnosis Date  . Hypertension   . Schizophrenia (HCC)   . Hyperlipemia   . Depression   . Diabetes (HCC)   . Liver disease   . Diabetes mellitus, type II (HCC)   . Heart disease   . Alcoholic cirrhosis of liver with ascites (HCC)   . Esophageal varices (HCC)   . Portal hypertensive gastropathy   . COPD (chronic obstructive pulmonary disease) (HCC)   . Anemia   . Multiple thyroid nodules     Patient Active Problem List   Diagnosis Date Noted  . Acute hepatic encephalopathy (HCC) 03/14/2016  . Controlled type 2 diabetes mellitus without complication (HCC) 01/15/2016  . Normocytic anemia 12/12/2015  . Essential (primary) hypertension 12/11/2015  . Pure hypercholesterolemia 12/11/2015  . Fever 11/11/2015  . Ascites due to alcoholic cirrhosis (HCC) 11/11/2015  . Cirrhosis of liver with ascites (HCC) 11/11/2015  . Hepatic encephalopathy (HCC) 10/16/2015  . Type 2 diabetes mellitus (HCC) 10/16/2015  . Acute renal failure (ARF) (HCC) 07/30/2015  . 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 Alcoholic cirrhosis (HCC) 07/19/2015  .  Hypoxia 07/04/2015  . Elevated transaminase level 07/04/2015  . DOE (dyspnea on exertion) 07/04/2015  . Alcohol abuse 05/31/2015  . Hypertension 05/29/2015  . Paranoid schizophrenia (HCC) 03/20/2015    Past Surgical History  Procedure Laterality Date  . No past surgeries    . Esophagogastroduodenoscopy (egd) with propofol N/A 08/03/2015    Procedure: ESOPHAGOGASTRODUODENOSCOPY (EGD) WITH PROPOFOL;  Surgeon: Elnita MaxwellMatthew Gordon Rein, MD;  [facility];  Service: Endoscopy;  Laterality: N/A;  . Esophagogastroduodenoscopy (egd) with propofol N/A 08/31/2015    Procedure: ESOPHAGOGASTRODUODENOSCOPY (EGD) WITH PROPOFOL;  Surgeon: Elnita MaxwellMatthew Gordon Rein, MD;  [facility];  Service: Endoscopy;  Laterality: N/A;  . Esophagogastroduodenoscopy N/A 10/05/2015    Procedure: ESOPHAGOGASTRODUODENOSCOPY (EGD);  Surgeon: Elnita MaxwellMatthew Gordon Rein, MD;  [facility];  Service: Endoscopy;  Laterality: N/A;  . Esophagogastroduodenoscopy (egd) with propofol N/A 04/04/2016    Procedure: ESOPHAGOGASTRODUODENOSCOPY (EGD) WITH PROPOFOL;  Surgeon: Scot Junobert T Elliott, MD;  [facility];  Service: Endoscopy;  Laterality: N/A;    Current Outpatient Rx  Name  Route  Sig  Dispense  Refill  . albuterol (PROVENTIL HFA;VENTOLIN HFA) 108 (90 BASE) MCG/ACT inhaler   Inhalation   Inhale 2 puffs into the lungs every 6 (six) hours as needed for wheezing or shortness of breath. Reported on 11/11/2015         . aspirin EC 81 MG tablet   Oral   Take 81 mg by mouth daily.          . citalopram (CELEXA) 20 MG  tablet   Oral   Take 1 tablet (20 mg total) by mouth daily.   90 tablet   1   . furosemide (LASIX) 20 MG tablet   Oral   Take 1 tablet (20 mg total) by mouth 2 (two) times daily.   30 tablet   2   . lactulose (CHRONULAC) 10 GM/15ML solution   Oral   Take 45 mLs (30 g total) by mouth 3 (three) times daily.   236 mL   5   . losartan (COZAAR) 100 MG tablet   Oral   Take 100  mg by mouth daily.         . metFORMIN (GLUCOPHAGE) 500 MG tablet   Oral   Take 1 tablet (500 mg total) by mouth 2 (two) times daily with a meal.   30 tablet   0   . metoCLOPramide (REGLAN) 10 MG tablet   Oral   Take 1 tablet (10 mg total) by mouth 3 (three) times daily before meals.   20 tablet   0   . metoprolol tartrate (LOPRESSOR) 25 MG tablet   Oral   Take 25 mg by mouth 2 (two) times daily.         . Multiple Vitamin (MULTIVITAMIN WITH MINERALS) TABS tablet   Oral   Take 1 tablet by mouth daily.   30 tablet   0   . nadolol (CORGARD) 40 MG tablet   Oral   Take 1 tablet (40 mg total) by mouth daily.   30 tablet   2   . OLANZapine (ZYPREXA) 20 MG tablet   Oral   Take 1 tablet (20 mg total) by mouth at bedtime.   90 tablet   1   . omeprazole (PRILOSEC) 40 MG capsule   Oral   Take 1 capsule (40 mg total) by mouth every morning.   30 capsule   2   . ondansetron (ZOFRAN) 4 MG tablet   Oral   Take 1 tablet (4 mg total) by mouth daily as needed for nausea or vomiting.   20 tablet   1   . rifaximin (XIFAXAN) 550 MG TABS tablet   Oral   Take 1 tablet (550 mg total) by mouth 2 (two) times daily.   60 tablet   2   . spironolactone (ALDACTONE) 25 MG tablet   Oral   Take 1 tablet (25 mg total) by mouth daily.   30 tablet   2     Allergies Review of patient's allergies indicates no known allergies.  Family History  Problem Relation Age of Onset  . Heart disease Mother   . Hypertension Mother   . Hyperlipidemia Mother   . Stroke Father   . Heart attack Father   . Hypertension Father   . Heart disease Father   . Alcohol abuse Father   . Heart disease Brother     Social History Social History  Substance Use Topics  . Smoking status: Never Smoker   . Smokeless tobacco: Never Used  . Alcohol Use: No     Comment: last drink 02/09/16    Review of Systems  Constitutional: Negative for fever. Cardiovascular: Positive for chest  pain. Respiratory: Negative for shortness of breath. Gastrointestinal: Negative for abdominal pain, vomiting and diarrhea. Neurological: Negative for headaches, focal weakness or numbness.  10-point ROS otherwise negative.  ____________________________________________   PHYSICAL EXAM:  VITAL SIGNS: ED Triage Vitals  Enc Vitals Group     BP 04/06/16 1921  115/68 mmHg     Pulse Rate 04/06/16 1921 90     Resp 04/06/16 1921 16     Temp 04/06/16 1921 99.2 F (37.3 C)     Temp Source 04/06/16 1921 Oral     SpO2 04/06/16 1921 96 %     Weight 04/06/16 1921 320 lb (145.151 kg)     Height 04/06/16 1921  (1.905 m)     Head Cir --      Peak Flow --      Pain Score 04/06/16 1922 8   Constitutional: Alert and oriented. Well appearing and in no distress. Eyes: Conjunctivae are normal. PERRL. Normal extraocular movements. ENT   Head: Normocephalic and atraumatic.   Nose: No congestion/rhinnorhea.   Mouth/Throat: Mucous membranes are moist.   Neck: No stridor. Hematological/Lymphatic/Immunilogical: No cervical lymphadenopathy. Cardiovascular: Normal rate, regular rhythm.  No murmurs, rubs, or gallops. Respiratory: Normal respiratory effort without tachypnea nor retractions. Breath sounds are clear and equal bilaterally. No wheezes/rales/rhonchi. Gastrointestinal: Soft and nontender. No distention. Genitourinary: Deferred Musculoskeletal: Normal range of motion in all extremities. No joint effusions.  No lower extremity tenderness nor edema. Neurologic:  Normal speech and language. No gross focal neurologic deficits are appreciated.  Skin:  Skin is warm, dry and intact. No rash noted. Psychiatric: Mood and affect are normal. Speech and behavior are normal. Patient exhibits appropriate insight and judgment.  ____________________________________________    LABS (pertinent positives/negatives)  Labs Reviewed  COMPREHENSIVE METABOLIC PANEL - Abnormal; Notable for the  following:    Glucose, Bld 110 (*)    Calcium 8.8 (*)    All other components within normal limits  CBC - Abnormal; Notable for the following:    RBC 3.52 (*)    Hemoglobin 9.9 (*)    HCT 30.3 (*)    RDW 16.6 (*)    Platelets 137 (*)    All other components within normal limits  TROPONIN I  POC OCCULT BLOOD, ED  TYPE AND SCREEN     ____________________________________________   EKG  I, Phineas Semen, attending physician, personally viewed and interpreted this EKG  EKG Time: 1934 Rate: 86 Rhythm: normal sinus rhythm Axis: left axis deviation Intervals: qtc 469 QRS: narrow ST changes: no st elevation Impression: abnormal ekg   ____________________________________________    RADIOLOGY  None  ____________________________________________   PROCEDURES  Procedure(s) performed: None  Critical Care performed: No  ____________________________________________   INITIAL IMPRESSION / ASSESSMENT AND PLAN / ED COURSE  Pertinent labs & imaging results that were available during my care of the patient were reviewed by me and considered in my medical decision making (see chart for details).  Patient presented to the emergency department today with primary complaint of chest and epigastric pain. This started after he underwent a recent endoscopy. During that endoscopy he did have esophageal varices banded. In addition the patient has continued complaints of dark stool. This was occurring even prior to the endoscopy. Patient was given a GI cocktail and stated that his pain resolved. Do think that a lot of his discomfort was related to the recent procedure. No shortness of breath, hypoxia, leukocytosis or ill appearance to suggest perforation of the esophagus. Will plan on discharging patient home with sucralfate and having patient follow up with primary care and GI.  ____________________________________________   FINAL CLINICAL IMPRESSION(S) / ED DIAGNOSES  Final  diagnoses:  Chest pain, unspecified chest pain type     Note: This dictation was prepared with Dragon dictation. Any transcriptional  errors that result from this process are unintentional    Phineas Semen, MD 04/06/16 2349

## 2016-04-06 NOTE — ED Notes (Signed)
Pt. Going home with mother. 

## 2016-04-06 NOTE — Discharge Instructions (Signed)
Please seek medical attention for any high fevers, chest pain, shortness of breath, change in behavior, persistent vomiting, bloody stool or any other new or concerning symptoms. ° ° °Nonspecific Chest Pain °It is often hard to find the cause of chest pain. There is always a chance that your pain could be related to something serious, such as a heart attack or a blood clot in your lungs. Chest pain can also be caused by conditions that are not life-threatening. If you have chest pain, it is very important to follow up with your doctor. ° °HOME CARE °· If you were prescribed an antibiotic medicine, finish it all even if you start to feel better. °· Avoid any activities that cause chest pain. °· Do not use any tobacco products, including cigarettes, chewing tobacco, or electronic cigarettes. If you need help quitting, ask your doctor. °· Do not drink alcohol. °· Take medicines only as told by your doctor. °· Keep all follow-up visits as told by your doctor. This is important. This includes any further testing if your chest pain does not go away. °· Your doctor may tell you to keep your head raised (elevated) while you sleep. °· Make lifestyle changes as told by your doctor. These may include: °¨ Getting regular exercise. Ask your doctor to suggest some activities that are safe for you. °¨ Eating a heart-healthy diet. Your doctor or a diet specialist (dietitian) can help you to learn healthy eating options. °¨ Maintaining a healthy weight. °¨ Managing diabetes, if necessary. °¨ Reducing stress. °GET HELP IF: °· Your chest pain does not go away, even after treatment. °· You have a rash with blisters on your chest. °· You have a fever. °GET HELP RIGHT AWAY IF: °· Your chest pain is worse. °· You have an increasing cough, or you cough up blood. °· You have severe belly (abdominal) pain. °· You feel extremely weak. °· You pass out (faint). °· You have chills. °· You have sudden, unexplained chest discomfort. °· You have  sudden, unexplained discomfort in your arms, back, neck, or jaw. °· You have shortness of breath at any time. °· You suddenly start to sweat, or your skin gets clammy. °· You feel nauseous. °· You vomit. °· You suddenly feel light-headed or dizzy. °· Your heart begins to beat quickly, or it feels like it is skipping beats. °These symptoms may be an emergency. Do not wait to see if the symptoms will go away. Get medical help right away. Call your local emergency services (911 in the [place] Do not drive yourself to the hospital. °  °This information is not intended to replace advice given to you by your health care provider. Make sure you discuss any questions you have with your health care provider. °  °Document Released: 02/25/2008 Document Revised: 09/29/2014 Document Reviewed: 04/14/2014 °Elsevier Interactive Patient Education ©2016 [employer] ° °

## 2016-04-06 NOTE — ED Notes (Signed)
Pt states upper endoscopy with "banding of three arteries" on Friday. Pt states has had epigastric pain and black stools with "Dry heaves" since 1700 today. Pt with pwd skin, resps unlabored.

## 2016-04-13 ENCOUNTER — Emergency Department
Admission: EM | Admit: 2016-04-13 | Discharge: 2016-04-13 | Disposition: A | Discharge status: Home / Self Care | Payer: Medicare Other | Attending: Emergency Medicine | Admitting: Emergency Medicine

## 2016-04-13 ENCOUNTER — Emergency Department: Payer: Medicare Other

## 2016-04-13 ENCOUNTER — Encounter: Payer: Self-pay | Admitting: Emergency Medicine

## 2016-04-13 DIAGNOSIS — F1010 Alcohol abuse, uncomplicated: Secondary | ICD-10-CM | POA: Diagnosis not present

## 2016-04-13 DIAGNOSIS — I10 Essential (primary) hypertension: Secondary | ICD-10-CM | POA: Insufficient documentation

## 2016-04-13 DIAGNOSIS — E119 Type 2 diabetes mellitus without complications: Secondary | ICD-10-CM | POA: Insufficient documentation

## 2016-04-13 DIAGNOSIS — I2510 Atherosclerotic heart disease of native coronary artery without angina pectoris: Secondary | ICD-10-CM | POA: Insufficient documentation

## 2016-04-13 DIAGNOSIS — R531 Weakness: Secondary | ICD-10-CM

## 2016-04-13 DIAGNOSIS — Z7984 Long term (current) use of oral hypoglycemic drugs: Secondary | ICD-10-CM | POA: Insufficient documentation

## 2016-04-13 DIAGNOSIS — K921 Melena: Secondary | ICD-10-CM

## 2016-04-13 DIAGNOSIS — R0789 Other chest pain: Secondary | ICD-10-CM | POA: Diagnosis present

## 2016-04-13 DIAGNOSIS — J449 Chronic obstructive pulmonary disease, unspecified: Secondary | ICD-10-CM | POA: Insufficient documentation

## 2016-04-13 DIAGNOSIS — Z79899 Other long term (current) drug therapy: Secondary | ICD-10-CM | POA: Diagnosis not present

## 2016-04-13 DIAGNOSIS — R079 Chest pain, unspecified: Secondary | ICD-10-CM

## 2016-04-13 DIAGNOSIS — E785 Hyperlipidemia, unspecified: Secondary | ICD-10-CM | POA: Diagnosis not present

## 2016-04-13 DIAGNOSIS — Z7982 Long term (current) use of aspirin: Secondary | ICD-10-CM | POA: Insufficient documentation

## 2016-04-13 LAB — URINALYSIS COMPLETE WITH MICROSCOPIC (ARMC ONLY)
BACTERIA UA: NONE SEEN
Bilirubin Urine: NEGATIVE
Glucose, UA: NEGATIVE mg/dL
Ketones, ur: NEGATIVE mg/dL
Leukocytes, UA: NEGATIVE
Nitrite: NEGATIVE
PROTEIN: 30 mg/dL — AB
SPECIFIC GRAVITY, URINE: 1.027 (ref 1.005–1.030)
pH: 6 (ref 5.0–8.0)

## 2016-04-13 LAB — COMPREHENSIVE METABOLIC PANEL
ALT: 34 U/L (ref 17–63)
AST: 40 U/L (ref 15–41)
Albumin: 3.7 g/dL (ref 3.5–5.0)
Alkaline Phosphatase: 93 U/L (ref 38–126)
Anion gap: 5 (ref 5–15)
BUN: 11 mg/dL (ref 6–20)
CHLORIDE: 108 mmol/L (ref 101–111)
CO2: 26 mmol/L (ref 22–32)
CREATININE: 0.72 mg/dL (ref 0.61–1.24)
Calcium: 8.6 mg/dL — ABNORMAL LOW (ref 8.9–10.3)
GFR calc Af Amer: >60 mL/min (ref >60)
GFR calc non Af Amer: >60 mL/min (ref >60)
GLUCOSE: 174 mg/dL — AB (ref 65–99)
Potassium: 3.7 mmol/L (ref 3.5–5.1)
SODIUM: 139 mmol/L (ref 135–145)
Total Bilirubin: 0.7 mg/dL (ref 0.3–1.2)
Total Protein: 7.4 g/dL (ref 6.5–8.1)

## 2016-04-13 LAB — CBC
HEMATOCRIT: 28.7 % — AB (ref 40.0–52.0)
HEMOGLOBIN: 9.5 g/dL — AB (ref 13.0–18.0)
MCH: 27.8 pg (ref 26.0–34.0)
MCHC: 33 g/dL (ref 32.0–36.0)
MCV: 84.4 fL (ref 80.0–100.0)
Platelets: 139 10*3/uL — ABNORMAL LOW (ref 150–440)
RBC: 3.4 MIL/uL — AB (ref 4.40–5.90)
RDW: 16.6 % — ABNORMAL HIGH (ref 11.5–14.5)
WBC: 5.4 10*3/uL (ref 3.8–10.6)

## 2016-04-13 LAB — TROPONIN I

## 2016-04-13 LAB — GLUCOSE, CAPILLARY: GLUCOSE-CAPILLARY: 71 mg/dL (ref 65–99)

## 2016-04-13 MED ORDER — SODIUM CHLORIDE 0.9 % IV BOLUS (SEPSIS)
1000.0000 mL | Freq: Once | INTRAVENOUS | Status: AC
  Administered 2016-04-13: 1000 mL via INTRAVENOUS

## 2016-04-13 NOTE — ED Provider Notes (Addendum)
[facility] Emergency Department Provider Note  ____________________________________________  Time seen: Approximately 7:40 PM  I have reviewed the triage vital signs and the nursing notes.   HISTORY  Chief Complaint Weakness; Fatigue; and Chest Pain    HPI Howard [name] is a 35 y.o. male with a history of COPD on 2 L nasal cannula, HTN, DM, CAD, alcoholic cirrhosis, and schizophrenia presenting for generalized weakness, chest pain, and dark stool.  The pt reports that he had been in his usual state of health until yesterday when he "relapsed" by drinking beer.  Before he went to bed he developed a left-sided chest "pressure" that did not radiate and was not associated with diaphoresis, nausea or vomiting, lightheadedness or fainting, no palpitations. This morning he continued to have chest discomfort, as well as generalized weakness throughout the day. He has not had any nausea, vomiting, diarrhea, fever or chills, cough or cold symptoms. He does report one episode of a dark stool today.   Past Medical History:  Diagnosis Date  . Alcoholic cirrhosis of liver with ascites (HCC)   . Anemia   . COPD (chronic obstructive pulmonary disease) (HCC)   . Depression   . Diabetes (HCC)   . Diabetes mellitus, type II (HCC)   . Esophageal varices (HCC)   . Heart disease   . Hyperlipemia   . Hypertension   . Liver disease   . Multiple thyroid nodules   . Portal hypertensive gastropathy   . Schizophrenia [facility])     Patient Active Problem List   Diagnosis Date Noted  . Acute hepatic encephalopathy (HCC) 03/14/2016  . Controlled type 2 diabetes mellitus without complication (HCC) 01/15/2016  . Normocytic anemia 12/12/2015  . Essential (primary) hypertension 12/11/2015  . Pure hypercholesterolemia 12/11/2015  . Fever 11/11/2015  . Ascites due to alcoholic cirrhosis (HCC) 11/11/2015  . Cirrhosis of liver with ascites (HCC) 11/11/2015  . Hepatic encephalopathy (HCC)  10/16/2015  . Type 2 diabetes mellitus (HCC) 10/16/2015  . Acute renal failure (ARF) (HCC) 07/30/2015  . 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 Alcoholic cirrhosis (HCC) 07/19/2015  . Hypoxia 07/04/2015  . Elevated transaminase level 07/04/2015  . DOE (dyspnea on exertion) 07/04/2015  . Alcohol abuse 05/31/2015  . Hypertension 05/29/2015  . Paranoid schizophrenia (HCC) 03/20/2015    Past Surgical History:  Procedure Laterality Date  . ESOPHAGOGASTRODUODENOSCOPY N/A 10/05/2015   Procedure: ESOPHAGOGASTRODUODENOSCOPY (EGD);  Surgeon: Elnita Maxwell, MD;  Location: [facility] ENDOSCOPY;  Service: Endoscopy;  Laterality: N/A;  . ESOPHAGOGASTRODUODENOSCOPY (EGD) WITH PROPOFOL N/A 08/03/2015   Procedure: ESOPHAGOGASTRODUODENOSCOPY (EGD) WITH PROPOFOL;  Surgeon: Elnita Maxwell, MD;  Location: [facility] ENDOSCOPY;  Service: Endoscopy;  Laterality: N/A;  . ESOPHAGOGASTRODUODENOSCOPY (EGD) WITH PROPOFOL N/A 08/31/2015   Procedure: ESOPHAGOGASTRODUODENOSCOPY (EGD) WITH PROPOFOL;  Surgeon: Elnita Maxwell, MD;  Location: [facility] ENDOSCOPY;  Service: Endoscopy;  Laterality: N/A;  . ESOPHAGOGASTRODUODENOSCOPY (EGD) WITH PROPOFOL N/A 04/04/2016   Procedure: ESOPHAGOGASTRODUODENOSCOPY (EGD) WITH PROPOFOL;  Surgeon: Scot Jun, MD;  Location: [facility] ENDOSCOPY;  Service: Endoscopy;  Laterality: N/A;  . NO PAST SURGERIES      Current Outpatient Rx  . Order #: [id number] Class: Historical Med  . Order #: [id number] Class: Historical Med  . Order #: [id number] Class: Normal  . Order #: [id number] Class: Normal  . Order #: [id number] Class: Normal  . Order #: [id number] Class: Historical Med  . Order #: [id number] Class: Normal  . Order #: [id number] Class: Print  . Order #: [id number] Class: Historical Med  .  Order #: [id number] Class: Normal  . Order #: [id number] Class: Historical Med  . Order #: [id number] Class: Historical Med  . Order #: [id number] Class: Normal  . Order #: [id number] Class:  Print  . Order #: [id number] Class: Normal  . Order #: [id number] Class: Normal  . Order #: [id number] Class: Print  . Order #: [id number] Class: Normal  . Order #: [id number] Class: Normal    Allergies Review of patient's allergies indicates no known allergies.  Family History  Problem Relation Age of Onset  . Heart disease Mother   . Hypertension Mother   . Hyperlipidemia Mother   . Stroke Father   . Heart attack Father   . Hypertension Father   . Heart disease Father   . Alcohol abuse Father   . Heart disease Brother     Social History Social History  Substance Use Topics  . Smoking status: Never Smoker  . Smokeless tobacco: Never Used  . Alcohol use No     Comment: last drink 02/09/16    Review of Systems Constitutional: No fever/chills.No lightheadedness or syncope. Positive generalized fatigue. Eyes: No visual changes. ENT: No sore throat. No congestion or rhinorrhea. Cardiovascular: Positive chest pain. Denies palpitations. Respiratory: Denies shortness of breath.  No cough. Gastrointestinal: No abdominal pain.  No nausea, no vomiting.  No diarrhea.  No constipation. Positive single dark stool. Genitourinary: Negative for dysuria. Musculoskeletal: Negative for back pain. Skin: Negative for rash. Neurological: Negative for headaches. No focal numbness, tingling or weakness.   10-point ROS otherwise negative.  ____________________________________________   PHYSICAL EXAM:  VITAL SIGNS: ED Triage Vitals  Enc Vitals Group     BP 04/13/16 1739 (!) 149/75     Pulse Rate 04/13/16 1739 89     Resp 04/13/16 1739 20     Temp 04/13/16 1739 98.5 F (36.9 C)     Temp Source 04/13/16 1739 Oral     SpO2 04/13/16 1739 96 %     Weight 04/13/16 1739 (!) 332 lb (150.6 kg)     Height 04/13/16 1739  (1.905 m)     Head Circumference --      Peak Flow --      Pain Score 04/13/16 1735 4     Pain Loc --      Pain Edu? --      Excl. in GC? --     Constitutional: Alert  and oriented. Chronically ill but in no acute distress. Answers questions appropriately. Eyes: Conjunctivae are normal.  EOMI. No scleral icterus. No conjunctival pallor. Head: Atraumatic. Nose: No congestion/rhinnorhea. Mouth/Throat: Mucous membranes are moist.  Neck: No stridor.  Supple.  No JVD. Cardiovascular: Normal rate, regular rhythm. No murmurs, rubs or gallops.  Respiratory: Normal respiratory effort.  No accessory muscle use or retractions. Lungs CTAB.  No wheezes, rales or ronchi. Gastrointestinal: Soft, nontender and nondistended.  No guarding or rebound.  No peritoneal signs. Genitourinary: No evidence of external hemorrhoids or palpable internal hemorrhoids. Stool is brown. Guaiac negative. No pain with rectal examination. Musculoskeletal: No LE edema. No ttp in the calves or palpable cords.  Negative Homan's sign. Neurologic:  A&Ox3.  Speech is clear.  Face and smile are symmetric.  EOMI.  Moves all extremities well. Skin:  Skin is warm, dry and intact. No rash noted. Psychiatric: Depressed mood and flat affect  ____________________________________________   LABS (all labs ordered are listed, but only abnormal results are displayed)  Labs Reviewed  CBC - Abnormal; Notable for the following:  Result Value   RBC 3.40 (*)    Hemoglobin 9.5 (*)    HCT 28.7 (*)    RDW 16.6 (*)    Platelets 139 (*)    All other components within normal limits  COMPREHENSIVE METABOLIC PANEL - Abnormal; Notable for the following:    Glucose, Bld 174 (*)    Calcium 8.6 (*)    All other components within normal limits  URINALYSIS COMPLETEWITH MICROSCOPIC ([facility] ONLY) - Abnormal; Notable for the following:    Color, Urine YELLOW (*)    APPearance CLEAR (*)    Hgb urine dipstick 3+ (*)    Protein, ur 30 (*)    Squamous Epithelial / LPF 0-5 (*)    All other components within normal limits  TROPONIN I   ____________________________________________  EKG  ED ECG REPORT I, Rockne Menghini, the attending physician, personally viewed and interpreted this ECG.   Date: 04/13/2016  EKG Time: 1740  Rate: 91  Rhythm: normal sinus rhythm  Axis: Leftward  Intervals:none  ST&T Change: No ST elevation. No ischemic changes.  ____________________________________________  RADIOLOGY  Dg Chest 2 View  Result Date: 04/13/2016 CLINICAL DATA:  Shortness of breath, chest pain EXAM: CHEST  2 VIEW COMPARISON:  03/03/2016 FINDINGS: Lungs are clear.  No pleural effusion or pneumothorax. The heart is normal in size. Visualized osseous structures are within normal limits. IMPRESSION: No evidence of acute cardiopulmonary disease. Electronically Signed   By: Charline Bills M.D.   On: 04/13/2016 18:31   ____________________________________________   PROCEDURES  Procedure(s) performed: None  Procedures  Critical Care performed: No ____________________________________________   INITIAL IMPRESSION / ASSESSMENT AND PLAN / ED COURSE  Pertinent labs & imaging results that were available during my care of the patient were reviewed by me and considered in my medical decision making (see chart for details).  35 y.o. male with multiple comorbidities presenting with generalized weakness, chest pain, and a single dark stool, as well as recent relapse with alcohol. Overall, the patient is chronically ill-appearing but has no focal findings on examination. It is possible that he is feeling poorly due to his alcohol intake yesterday. However, he does have significant cardiac risk factors as well as known CAD, and although his EKG does not have any ischemic changes, no check a troponin. In addition we will rule out urinary tract infection, electrolyte disturbance, or GI bleed with symptomatic anemia.  ----------------------------------------- 7:44 PM on 04/13/2016 -----------------------------------------  The patient's urine shows some hematuria without any other evidence of  infection. His electrolytes are reassuring. His hemoglobin is unchanged and is at baseline today. His EKG does not show ischemic changes and his troponin is negative. Chest x-ray does not show any acute cardiopulmonary process.  ____________________________________________  FINAL CLINICAL IMPRESSION(S) / ED DIAGNOSES  Final diagnoses:  Chest pain, unspecified chest pain type  Black stool    Clinical Course  Comment By Time  At this time, the patient is completely asymptomatic. His EKG does not show ischemic changes, his chest x-ray does not show any acute cardiopulmonary processes, and his troponin is negative. He does not have any blood in his stool and his anemia as chronic and at baseline. I'll plan to speak with Dr. Ernst Breach, to schedule close PMD follow-up. It has been years since the patient last had his stress test or risk stratification study, so he will get that as an outpatient. Plan discharge. Return precautions and follow-up instructions were discussed. Rockne Menghini, MD 07/23 2130   I  have spoken to Dr. Graciela Husbands who will let [name] know in the morning.   NEW MEDICATIONS STARTED DURING THIS VISIT:  New Prescriptions   No medications on file      Rockne Menghini, MD 04/13/16 2036    Rockne Menghini, MD 04/13/16 2133

## 2016-04-13 NOTE — ED Triage Notes (Signed)
Pt has been an alcoholic and not had a drink in 1 month. Drank 3 beers last night and now feels weak, fatigue, and has had chest pain today. Also c/o shortness of breath. Wears 2 L oxygen at baseline for COPD

## 2016-04-13 NOTE — Discharge Instructions (Signed)
Please make a follow-up appointment with [name].  He will reevaluate you, and help you schedule an outpatient stress test for you chest pain.  Please return to the emergency department a few develop chest pain, shortness of breath, lightheadedness or fainting, inability to keep down fluids, fever, or any other symptoms concerning to you.

## 2016-04-20 ENCOUNTER — Emergency Department
Admission: EM | Admit: 2016-04-20 | Discharge: 2016-04-21 | Disposition: A | Discharge status: Home / Self Care | Payer: Medicare Other | Attending: Emergency Medicine | Admitting: Emergency Medicine

## 2016-04-20 ENCOUNTER — Emergency Department: Payer: Medicare Other

## 2016-04-20 ENCOUNTER — Encounter: Payer: Self-pay | Admitting: *Deleted

## 2016-04-20 DIAGNOSIS — Z79899 Other long term (current) drug therapy: Secondary | ICD-10-CM | POA: Insufficient documentation

## 2016-04-20 DIAGNOSIS — R55 Syncope and collapse: Secondary | ICD-10-CM | POA: Diagnosis not present

## 2016-04-20 DIAGNOSIS — E785 Hyperlipidemia, unspecified: Secondary | ICD-10-CM | POA: Insufficient documentation

## 2016-04-20 DIAGNOSIS — I10 Essential (primary) hypertension: Secondary | ICD-10-CM | POA: Diagnosis not present

## 2016-04-20 DIAGNOSIS — R109 Unspecified abdominal pain: Secondary | ICD-10-CM | POA: Insufficient documentation

## 2016-04-20 DIAGNOSIS — Z7984 Long term (current) use of oral hypoglycemic drugs: Secondary | ICD-10-CM | POA: Diagnosis not present

## 2016-04-20 DIAGNOSIS — J449 Chronic obstructive pulmonary disease, unspecified: Secondary | ICD-10-CM | POA: Diagnosis not present

## 2016-04-20 DIAGNOSIS — E119 Type 2 diabetes mellitus without complications: Secondary | ICD-10-CM | POA: Diagnosis not present

## 2016-04-20 DIAGNOSIS — Z7982 Long term (current) use of aspirin: Secondary | ICD-10-CM | POA: Diagnosis not present

## 2016-04-20 DIAGNOSIS — R531 Weakness: Secondary | ICD-10-CM | POA: Diagnosis present

## 2016-04-20 LAB — BASIC METABOLIC PANEL
Anion gap: 5 (ref 5–15)
BUN: 17 mg/dL (ref 6–20)
CHLORIDE: 111 mmol/L (ref 101–111)
CO2: 25 mmol/L (ref 22–32)
Calcium: 8.6 mg/dL — ABNORMAL LOW (ref 8.9–10.3)
Creatinine, Ser: 0.8 mg/dL (ref 0.61–1.24)
GFR calc Af Amer: >60 mL/min (ref >60)
GFR calc non Af Amer: >60 mL/min (ref >60)
Glucose, Bld: 97 mg/dL (ref 65–99)
POTASSIUM: 3.7 mmol/L (ref 3.5–5.1)
SODIUM: 141 mmol/L (ref 135–145)

## 2016-04-20 LAB — URINALYSIS COMPLETE WITH MICROSCOPIC (ARMC ONLY)
BACTERIA UA: NONE SEEN
Bilirubin Urine: NEGATIVE
Glucose, UA: NEGATIVE mg/dL
Ketones, ur: NEGATIVE mg/dL
LEUKOCYTES UA: NEGATIVE
Nitrite: NEGATIVE
PH: 6 (ref 5.0–8.0)
PROTEIN: NEGATIVE mg/dL
SQUAMOUS EPITHELIAL / LPF: NONE SEEN
Specific Gravity, Urine: 1.021 (ref 1.005–1.030)

## 2016-04-20 LAB — CBC
HEMATOCRIT: 26.6 % — AB (ref 40.0–52.0)
HEMOGLOBIN: 8.9 g/dL — AB (ref 13.0–18.0)
MCH: 27.4 pg (ref 26.0–34.0)
MCHC: 33.3 g/dL (ref 32.0–36.0)
MCV: 82.3 fL (ref 80.0–100.0)
Platelets: 125 10*3/uL — ABNORMAL LOW (ref 150–440)
RBC: 3.23 MIL/uL — ABNORMAL LOW (ref 4.40–5.90)
RDW: 16.3 % — AB (ref 11.5–14.5)
WBC: 6.5 10*3/uL (ref 3.8–10.6)

## 2016-04-20 LAB — ETHANOL

## 2016-04-20 MED ORDER — SODIUM CHLORIDE 0.9 % IV BOLUS (SEPSIS)
500.0000 mL | Freq: Once | INTRAVENOUS | Status: AC
  Administered 2016-04-20: 500 mL via INTRAVENOUS

## 2016-04-20 NOTE — ED Triage Notes (Signed)
Pt has returned to drinking ETOH and reports last drink as 1 day ago.  Pt states he became weak today at 1700 and fell to floor x 1 and twice fell but did not fall to floor. Pt uses a walker secondary to poor mobility. Pt c/o chest pain but did not experience this prior to the fall. Pt is grossly neurologically intact, equal grips, equal push pulls w/ legs, no drift of arms or legs noted, no facial droop and speech is baseline, no confusion noted, A&Ox4.

## 2016-04-20 NOTE — ED Provider Notes (Signed)
Time Seen: Approximately *2030  I have reviewed the triage notes  Chief Complaint: Weakness and Fall   History of Present Illness: Howard [name] is a 35 y.o. male *who has been here now for the last 2 weekends with complaints of generalized weakness, chest discomfort, etc. Patient has a history of alcohol related cirrhosis, esophageal varices, etc. He describes generalized weakness and apparently had a non-syncopal fall. He states he did drink a large amount of alcohol yesterday. He apparently fell landing on his left side mainly in the abdominal area and the story he complains of most of his discomfort. He denies any right-sided persistent pain. He denies any pelvic or hip discomfort. He has been up and ambulatory since his fall.   Past Medical History:  Diagnosis Date  . Alcoholic cirrhosis of liver with ascites (HCC)   . Anemia   . COPD (chronic obstructive pulmonary disease) (HCC)   . Depression   . Diabetes (HCC)   . Diabetes mellitus, type II (HCC)   . Esophageal varices (HCC)   . Heart disease   . Hyperlipemia   . Hypertension   . Liver disease   . Multiple thyroid nodules   . Portal hypertensive gastropathy   . Schizophrenia [facility])     Patient Active Problem List   Diagnosis Date Noted  . Acute hepatic encephalopathy (HCC) 03/14/2016  . Controlled type 2 diabetes mellitus without complication (HCC) 01/15/2016  . Normocytic anemia 12/12/2015  . Essential (primary) hypertension 12/11/2015  . Pure hypercholesterolemia 12/11/2015  . Fever 11/11/2015  . Ascites due to alcoholic cirrhosis (HCC) 11/11/2015  . Cirrhosis of liver with ascites (HCC) 11/11/2015  . Hepatic encephalopathy (HCC) 10/16/2015  . Type 2 diabetes mellitus (HCC) 10/16/2015  . Acute renal failure (ARF) (HCC) 07/30/2015  . 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 Alcoholic cirrhosis (HCC) 07/19/2015  . Hypoxia 07/04/2015  . Elevated transaminase level 07/04/2015  . DOE (dyspnea  on exertion) 07/04/2015  . Alcohol abuse 05/31/2015  . Hypertension 05/29/2015  . Paranoid schizophrenia (HCC) 03/20/2015    Past Surgical History:  Procedure Laterality Date  . ESOPHAGOGASTRODUODENOSCOPY N/A 10/05/2015   Procedure: ESOPHAGOGASTRODUODENOSCOPY (EGD);  Surgeon: Elnita Maxwell, MD;  Location: [facility] ENDOSCOPY;  Service: Endoscopy;  Laterality: N/A;  . ESOPHAGOGASTRODUODENOSCOPY (EGD) WITH PROPOFOL N/A 08/03/2015   Procedure: ESOPHAGOGASTRODUODENOSCOPY (EGD) WITH PROPOFOL;  Surgeon: Elnita Maxwell, MD;  Location: [facility] ENDOSCOPY;  Service: Endoscopy;  Laterality: N/A;  . ESOPHAGOGASTRODUODENOSCOPY (EGD) WITH PROPOFOL N/A 08/31/2015   Procedure: ESOPHAGOGASTRODUODENOSCOPY (EGD) WITH PROPOFOL;  Surgeon: Elnita Maxwell, MD;  Location: [facility] ENDOSCOPY;  Service: Endoscopy;  Laterality: N/A;  . ESOPHAGOGASTRODUODENOSCOPY (EGD) WITH PROPOFOL N/A 04/04/2016   Procedure: ESOPHAGOGASTRODUODENOSCOPY (EGD) WITH PROPOFOL;  Surgeon: Scot Jun, MD;  Location: [facility] ENDOSCOPY;  Service: Endoscopy;  Laterality: N/A;  . NO PAST SURGERIES      Past Surgical History:  Procedure Laterality Date  . ESOPHAGOGASTRODUODENOSCOPY N/A 10/05/2015   Procedure: ESOPHAGOGASTRODUODENOSCOPY (EGD);  Surgeon: Elnita Maxwell, MD;  Location: [facility] ENDOSCOPY;  Service: Endoscopy;  Laterality: N/A;  . ESOPHAGOGASTRODUODENOSCOPY (EGD) WITH PROPOFOL N/A 08/03/2015   Procedure: ESOPHAGOGASTRODUODENOSCOPY (EGD) WITH PROPOFOL;  Surgeon: Elnita Maxwell, MD;  Location: [facility] ENDOSCOPY;  Service: Endoscopy;  Laterality: N/A;  . ESOPHAGOGASTRODUODENOSCOPY (EGD) WITH PROPOFOL N/A 08/31/2015   Procedure: ESOPHAGOGASTRODUODENOSCOPY (EGD) WITH PROPOFOL;  Surgeon: Elnita Maxwell, MD;  Location: [facility] ENDOSCOPY;  Service: Endoscopy;  Laterality: N/A;  . ESOPHAGOGASTRODUODENOSCOPY (EGD) WITH PROPOFOL N/A 04/04/2016   Procedure:  ESOPHAGOGASTRODUODENOSCOPY (EGD) WITH PROPOFOL;  Surgeon: Scot Jun, MD;   Location: [facility] ENDOSCOPY;  Service: Endoscopy;  Laterality: N/A;  . NO PAST SURGERIES      Current Outpatient Rx  . Order #: [id number] Class: Historical Med  . Order #: [id number] Class: Historical Med  . Order #: [id number] Class: Normal  . Order #: [id number] Class: Normal  . Order #: [id number] Class: Normal  . Order #: [id number] Class: Historical Med  . Order #: [id number] Class: Normal  . Order #: [id number] Class: Print  . Order #: [id number] Class: Historical Med  . Order #: [id number] Class: Normal  . Order #: [id number] Class: Historical Med  . Order #: [id number] Class: Normal  . Order #: [id number] Class: Normal  . Order #: [id number] Class: Historical Med  . Order #: [id number] Class: Normal  . Order #: [id number] Class: Print  . Order #: [id number] Class: Normal  . Order #: [id number] Class: Normal  . Order #: [id number] Class: Print    Allergies:  Review of patient's allergies indicates no known allergies.  Family History: Family History  Problem Relation Age of Onset  . Heart disease Mother   . Hypertension Mother   . Hyperlipidemia Mother   . Stroke Father   . Heart attack Father   . Hypertension Father   . Heart disease Father   . Alcohol abuse Father   . Heart disease Brother     Social History: Social History  Substance Use Topics  . Smoking status: Never Smoker  . Smokeless tobacco: Never Used  . Alcohol use 48.0 oz/week    80 Cans of beer per week     Comment: last drink 04/19/16     Review of Systems:   10 point review of systems was performed and was otherwise negative:  Constitutional: No fever Eyes: No visual disturbances ENT: No sore throat, ear pain Cardiac: No chest pain Respiratory: No shortness of breath, wheezing, or stridor Abdomen: No abdominal pain, no vomiting, No diarrhea Endocrine: No weight loss, No night sweats Extremities: No peripheral edema, cyanosis Skin: No rashes, easy bruising Neurologic: No focal weakness, trouble with speech or  swollowing Urologic: No dysuria, Hematuria, or urinary frequency  Physical Exam:  ED Triage Vitals  Enc Vitals Group     BP 04/20/16 2007 138/76     Pulse Rate 04/20/16 2007 83     Resp 04/20/16 2007 20     Temp 04/20/16 2007 98.2 F (36.8 C)     Temp Source 04/20/16 2007 Oral     SpO2 04/20/16 2007 95 %     Weight 04/20/16 2007 (!) 330 lb (149.7 kg)     Height 04/20/16 2007 6\' 3"  (1.905 m)     Head Circumference --      Peak Flow --      Pain Score 04/20/16 2009 6     Pain Loc --      Pain Edu? --      Excl. in GC? --     General: Awake , Alert , and Oriented times 3; GCS 15 Head: Normal cephalic , atraumatic Eyes: Pupils equal , round, reactive to light Nose/Throat: No nasal drainage, patent upper airway without erythema or exudate.  Neck: Supple, Full range of motion, No anterior adenopathy or palpable thyroid masses Lungs: Clear to ascultation without wheezes , rhonchi, or rales Heart: Regular rate, regular rhythm without murmurs , gallops , or rubs Abdomen: Mild tenderness in the left side of the abdomen without rebound, guarding , or rigidity; bowel sounds positive and symmetric in all 4 quadrants. No  organomegaly .    No abdominal wall contusions    Extremities: 2 plus symmetric pulses. No edema, clubbing or cyanosis Neurologic: normal ambulation, Motor symmetric without deficits, sensory intact Skin: warm, dry, no rashes   Labs:   All laboratory work was reviewed including any pertinent negatives or positives listed below:  Labs Reviewed  BASIC METABOLIC PANEL  CBC  URINALYSIS COMPLETEWITH MICROSCOPIC ([facility] ONLY)  ETHANOL  CBG MONITORING, ED  Reviewed the patient's laboratory work shows a slight drop in his hemoglobin down to 8.9  Patient also has some hematuria of unknown etiology  Rectal exam with chaperone present shows normal sphincter tone, guaiac-negative, no palpable masses in the rectal vault  EKG:  ED ECG REPORT I, Jennye Moccasin, the attending  physician, personally viewed and interpreted this ECG.  Date: 04/20/2016 EKG Time: 1954 Rate: 77 Rhythm: normal sinus rhythm QRS Axis: normal Intervals: normal ST/T Wave abnormalities: normal Conduction Disturbances: none Narrative Interpretation: unremarkable Normal EKG  Radiology: CT of the abdomen and renal colic protocol IMPRESSION: Cirrhosis with evidence of portal hypertension and mild splenomegaly. Nonspecific diffuse mesenteric stranding, likely related to cirrhosis and mesenteric edema. No hydronephrosis or nephrolithiasis. No evidence of bowel obstruction or active inflammation. Normal appendix. Electronically Signed   By: Elgie Collard M.D.   On: 04/20/2016 22:29   I personally reviewed the radiologic studies    ED Course:   Patient's stay here was uneventful and I felt he could be discharged at this time. He has had a slight drop in his hemoglobin. Describes no recent history of melena over the past week and also he is guaiac-negative on rectal exam. The anemia may be from chronic substance abuse. He also has some hematuria that doesn't sound based on history to be gross hematuria. I felt this was unlikely to be a traumatic injury at this time with no evidence of hematoma in the abdominal cavity. The patient I felt could be treated on an outpatient basis and was advised to follow up with his primary physician. Continue to try to avoid alcohol.   Clinical Course     Assessment: Near syncope Status post fall with abdominal injury History of cirrhosis and esophageal varicosities     Plan: * Outpatient Patient was advised to return immediately if condition worsens. Patient was advised to follow up with their primary care physician or other specialized physicians involved in their outpatient care. The patient and/or family member/power of attorney had laboratory results reviewed at the bedside. All questions and concerns were addressed and appropriate  discharge instructions were distributed by the nursing staff.             Jennye Moccasin, MD 04/20/16 [phone]

## 2016-04-20 NOTE — Discharge Instructions (Signed)
Please return immediately if condition worsens. Please contact her primary physician or the physician you were given for referral. If you have any specialist physicians involved in her treatment and plan please also contact them. Thank you for using [facility]. ° °

## 2016-04-21 NOTE — ED Notes (Signed)
Discharge instructions reviewed with patient. Patient verbalized understanding. Patient taken to lobby via wheelchair without difficulty 

## 2016-04-27 ENCOUNTER — Observation Stay
Admission: EM | Admit: 2016-04-27 | Discharge: 2016-04-29 | Disposition: A | Discharge status: Home / Self Care | Payer: Medicare Other | Attending: Internal Medicine | Admitting: Internal Medicine

## 2016-04-27 ENCOUNTER — Emergency Department: Payer: Medicare Other

## 2016-04-27 DIAGNOSIS — Z79899 Other long term (current) drug therapy: Secondary | ICD-10-CM | POA: Insufficient documentation

## 2016-04-27 DIAGNOSIS — F200 Paranoid schizophrenia: Secondary | ICD-10-CM | POA: Insufficient documentation

## 2016-04-27 DIAGNOSIS — Z7982 Long term (current) use of aspirin: Secondary | ICD-10-CM | POA: Insufficient documentation

## 2016-04-27 DIAGNOSIS — K7290 Hepatic failure, unspecified without coma: Secondary | ICD-10-CM | POA: Diagnosis present

## 2016-04-27 DIAGNOSIS — D649 Anemia, unspecified: Secondary | ICD-10-CM | POA: Insufficient documentation

## 2016-04-27 DIAGNOSIS — D689 Coagulation defect, unspecified: Secondary | ICD-10-CM

## 2016-04-27 DIAGNOSIS — I8510 Secondary esophageal varices without bleeding: Principal | ICD-10-CM | POA: Insufficient documentation

## 2016-04-27 DIAGNOSIS — G4733 Obstructive sleep apnea (adult) (pediatric): Secondary | ICD-10-CM | POA: Diagnosis not present

## 2016-04-27 DIAGNOSIS — R531 Weakness: Secondary | ICD-10-CM | POA: Insufficient documentation

## 2016-04-27 DIAGNOSIS — F329 Major depressive disorder, single episode, unspecified: Secondary | ICD-10-CM | POA: Diagnosis not present

## 2016-04-27 DIAGNOSIS — Z8249 Family history of ischemic heart disease and other diseases of the circulatory system: Secondary | ICD-10-CM | POA: Insufficient documentation

## 2016-04-27 DIAGNOSIS — R51 Headache: Secondary | ICD-10-CM | POA: Insufficient documentation

## 2016-04-27 DIAGNOSIS — R0902 Hypoxemia: Secondary | ICD-10-CM | POA: Insufficient documentation

## 2016-04-27 DIAGNOSIS — J449 Chronic obstructive pulmonary disease, unspecified: Secondary | ICD-10-CM | POA: Insufficient documentation

## 2016-04-27 DIAGNOSIS — W19XXXA Unspecified fall, initial encounter: Secondary | ICD-10-CM | POA: Insufficient documentation

## 2016-04-27 DIAGNOSIS — E785 Hyperlipidemia, unspecified: Secondary | ICD-10-CM | POA: Insufficient documentation

## 2016-04-27 DIAGNOSIS — E669 Obesity, unspecified: Secondary | ICD-10-CM | POA: Insufficient documentation

## 2016-04-27 DIAGNOSIS — K3189 Other diseases of stomach and duodenum: Secondary | ICD-10-CM | POA: Insufficient documentation

## 2016-04-27 DIAGNOSIS — Z23 Encounter for immunization: Secondary | ICD-10-CM | POA: Insufficient documentation

## 2016-04-27 DIAGNOSIS — K7031 Alcoholic cirrhosis of liver with ascites: Secondary | ICD-10-CM | POA: Diagnosis not present

## 2016-04-27 DIAGNOSIS — Y92009 Unspecified place in unspecified non-institutional (private) residence as the place of occurrence of the external cause: Secondary | ICD-10-CM

## 2016-04-27 DIAGNOSIS — E7800 Pure hypercholesterolemia, unspecified: Secondary | ICD-10-CM | POA: Diagnosis not present

## 2016-04-27 DIAGNOSIS — E119 Type 2 diabetes mellitus without complications: Secondary | ICD-10-CM | POA: Diagnosis not present

## 2016-04-27 DIAGNOSIS — Z823 Family history of stroke: Secondary | ICD-10-CM | POA: Diagnosis not present

## 2016-04-27 DIAGNOSIS — Z6841 Body Mass Index (BMI) 40.0 and over, adult: Secondary | ICD-10-CM | POA: Diagnosis not present

## 2016-04-27 DIAGNOSIS — K766 Portal hypertension: Secondary | ICD-10-CM | POA: Insufficient documentation

## 2016-04-27 DIAGNOSIS — F1010 Alcohol abuse, uncomplicated: Secondary | ICD-10-CM | POA: Diagnosis not present

## 2016-04-27 DIAGNOSIS — K7682 Hepatic encephalopathy: Secondary | ICD-10-CM | POA: Diagnosis present

## 2016-04-27 DIAGNOSIS — Z8489 Family history of other specified conditions: Secondary | ICD-10-CM | POA: Insufficient documentation

## 2016-04-27 DIAGNOSIS — Z811 Family history of alcohol abuse and dependence: Secondary | ICD-10-CM | POA: Diagnosis not present

## 2016-04-27 DIAGNOSIS — Z794 Long term (current) use of insulin: Secondary | ICD-10-CM | POA: Diagnosis not present

## 2016-04-27 DIAGNOSIS — D696 Thrombocytopenia, unspecified: Secondary | ICD-10-CM | POA: Diagnosis not present

## 2016-04-27 DIAGNOSIS — R5383 Other fatigue: Secondary | ICD-10-CM | POA: Insufficient documentation

## 2016-04-27 DIAGNOSIS — R2681 Unsteadiness on feet: Secondary | ICD-10-CM | POA: Insufficient documentation

## 2016-04-27 LAB — COMPREHENSIVE METABOLIC PANEL
ALBUMIN: 3.5 g/dL (ref 3.5–5.0)
ALK PHOS: 106 U/L (ref 38–126)
ALT: 27 U/L (ref 17–63)
AST: 30 U/L (ref 15–41)
Anion gap: 5 (ref 5–15)
BUN: 11 mg/dL (ref 6–20)
CALCIUM: 8.8 mg/dL — AB (ref 8.9–10.3)
CHLORIDE: 107 mmol/L (ref 101–111)
CO2: 26 mmol/L (ref 22–32)
CREATININE: 0.79 mg/dL (ref 0.61–1.24)
GFR calc Af Amer: >60 mL/min (ref >60)
GFR calc non Af Amer: >60 mL/min (ref >60)
GLUCOSE: 290 mg/dL — AB (ref 65–99)
Potassium: 4.1 mmol/L (ref 3.5–5.1)
SODIUM: 138 mmol/L (ref 135–145)
Total Bilirubin: 0.6 mg/dL (ref 0.3–1.2)
Total Protein: 7.2 g/dL (ref 6.5–8.1)

## 2016-04-27 LAB — URINALYSIS COMPLETE WITH MICROSCOPIC (ARMC ONLY)
BACTERIA UA: NONE SEEN
Bilirubin Urine: NEGATIVE
Glucose, UA: >500 mg/dL — AB
Ketones, ur: NEGATIVE mg/dL
Leukocytes, UA: NEGATIVE
NITRITE: NEGATIVE
PROTEIN: NEGATIVE mg/dL
SPECIFIC GRAVITY, URINE: 1.028 (ref 1.005–1.030)
Squamous Epithelial / LPF: NONE SEEN
WBC UA: NONE SEEN WBC/hpf (ref 0–5)
pH: 7 (ref 5.0–8.0)

## 2016-04-27 LAB — URINE DRUG SCREEN, QUALITATIVE (ARMC ONLY)
Amphetamines, Ur Screen: NOT DETECTED
BARBITURATES, UR SCREEN: NOT DETECTED
BENZODIAZEPINE, UR SCRN: NOT DETECTED
CANNABINOID 50 NG, UR ~~LOC~~: NOT DETECTED
Cocaine Metabolite,Ur ~~LOC~~: NOT DETECTED
MDMA (Ecstasy)Ur Screen: NOT DETECTED
METHADONE SCREEN, URINE: NOT DETECTED
Opiate, Ur Screen: NOT DETECTED
Phencyclidine (PCP) Ur S: NOT DETECTED
TRICYCLIC, UR SCREEN: NOT DETECTED

## 2016-04-27 LAB — CBC WITH DIFFERENTIAL/PLATELET
BASOS PCT: 1 %
Basophils Absolute: 0 10*3/uL (ref 0–0.1)
EOS ABS: 0.2 10*3/uL (ref 0–0.7)
Eosinophils Relative: 4 %
HEMATOCRIT: 27.5 % — AB (ref 40.0–52.0)
HEMOGLOBIN: 9 g/dL — AB (ref 13.0–18.0)
LYMPHS ABS: 1.4 10*3/uL (ref 1.0–3.6)
Lymphocytes Relative: 23 %
MCH: 27.1 pg (ref 26.0–34.0)
MCHC: 32.7 g/dL (ref 32.0–36.0)
MCV: 83 fL (ref 80.0–100.0)
MONOS PCT: 10 %
Monocytes Absolute: 0.6 10*3/uL (ref 0.2–1.0)
Neutro Abs: 4 10*3/uL (ref 1.4–6.5)
Neutrophils Relative %: 64 %
Platelets: 127 10*3/uL — ABNORMAL LOW (ref 150–440)
RBC: 3.31 MIL/uL — AB (ref 4.40–5.90)
RDW: 16.3 % — ABNORMAL HIGH (ref 11.5–14.5)
WBC: 6.3 10*3/uL (ref 3.8–10.6)

## 2016-04-27 LAB — AMMONIA: AMMONIA: 119 umol/L — AB (ref 9–35)

## 2016-04-27 LAB — LIPASE, BLOOD: LIPASE: 29 U/L (ref 11–51)

## 2016-04-27 MED ORDER — ONDANSETRON HCL 4 MG/2ML IJ SOLN
INTRAMUSCULAR | Status: AC
  Administered 2016-04-27: 4 mg via INTRAVENOUS
  Filled 2016-04-27: qty 2

## 2016-04-27 MED ORDER — IOPAMIDOL (ISOVUE-300) INJECTION 61%
125.0000 mL | Freq: Once | INTRAVENOUS | Status: AC | PRN
  Administered 2016-04-27: 125 mL via INTRAVENOUS

## 2016-04-27 MED ORDER — ONDANSETRON HCL 4 MG/2ML IJ SOLN
4.0000 mg | Freq: Once | INTRAMUSCULAR | Status: AC
  Administered 2016-04-27: 4 mg via INTRAVENOUS

## 2016-04-27 NOTE — ED Notes (Signed)
Pt was walking in from parking lot and had to stop at the top of the circle; mother came in requesting assistance; she says pt has been falling "a lot" lately and she's not sure why; wheelchair taken to top of circle and pt assisted inside without incident; encouraged mother to bring pt up to entrance next time if possible and we would be more than happy to assist with any needs; verbalized understanding before saying "there was one handicapped spot left and I wanted to get it";

## 2016-04-27 NOTE — ED Provider Notes (Addendum)
[facility] Emergency Department Provider Note   ____________________________________________   First MD Initiated Contact with Patient 04/27/16 2013     (approximate)  I have reviewed the triage vital signs and the nursing notes.   HISTORY  Chief Complaint Fall    HPI Howard [name] is a 35 y.o. male patient and his mother came in reporting he's been very weak and has been falling a lot. Patient fell today and hurt his belly is belly is very tender in the mid abdomen. It is not tender elsewhere. Patient has a known history of cirrhosis of liver. Patient has been taking all of his medications including his lactulose. Patient does not understand why he is falling so much and what he's been so weak lately. Palpation of the abdomen makes the pain is localized in the mid abdomen worse. Patient has no bruising there. Pain is achy in nature. This started after he fell.   Past Medical History:  Diagnosis Date  . Alcoholic cirrhosis of liver with ascites (HCC)   . Anemia   . COPD (chronic obstructive pulmonary disease) (HCC)   . Depression   . Diabetes (HCC)   . Diabetes mellitus, type II (HCC)   . Esophageal varices (HCC)   . Heart disease   . Hyperlipemia   . Hypertension   . Liver disease   . Multiple thyroid nodules   . Portal hypertensive gastropathy   . Schizophrenia [facility])     Patient Active Problem List   Diagnosis Date Noted  . Acute hepatic encephalopathy (HCC) 03/14/2016  . Controlled type 2 diabetes mellitus without complication (HCC) 01/15/2016  . Normocytic anemia 12/12/2015  . Essential (primary) hypertension 12/11/2015  . Pure hypercholesterolemia 12/11/2015  . Fever 11/11/2015  . Ascites due to alcoholic cirrhosis (HCC) 11/11/2015  . Cirrhosis of liver with ascites (HCC) 11/11/2015  . Hepatic encephalopathy (HCC) 10/16/2015  . Type 2 diabetes mellitus (HCC) 10/16/2015  . Acute renal failure (ARF) (HCC) 07/30/2015  . Alcoholic  cirrhosis of liver with ascites (HCC) 07/19/2015  . Alcoholic cirrhosis (HCC) 07/19/2015  . Hypoxia 07/04/2015  . Elevated transaminase level 07/04/2015  . DOE (dyspnea on exertion) 07/04/2015  . Alcohol abuse 05/31/2015  . Hypertension 05/29/2015  . Paranoid schizophrenia (HCC) 03/20/2015    Past Surgical History:  Procedure Laterality Date  . ESOPHAGOGASTRODUODENOSCOPY N/A 10/05/2015   Procedure: ESOPHAGOGASTRODUODENOSCOPY (EGD);  Surgeon: Elnita Maxwell, MD;  Location: [facility] ENDOSCOPY;  Service: Endoscopy;  Laterality: N/A;  . ESOPHAGOGASTRODUODENOSCOPY (EGD) WITH PROPOFOL N/A 08/03/2015   Procedure: ESOPHAGOGASTRODUODENOSCOPY (EGD) WITH PROPOFOL;  Surgeon: Elnita Maxwell, MD;  Location: [facility] ENDOSCOPY;  Service: Endoscopy;  Laterality: N/A;  . ESOPHAGOGASTRODUODENOSCOPY (EGD) WITH PROPOFOL N/A 08/31/2015   Procedure: ESOPHAGOGASTRODUODENOSCOPY (EGD) WITH PROPOFOL;  Surgeon: Elnita Maxwell, MD;  Location: [facility] ENDOSCOPY;  Service: Endoscopy;  Laterality: N/A;  . ESOPHAGOGASTRODUODENOSCOPY (EGD) WITH PROPOFOL N/A 04/04/2016   Procedure: ESOPHAGOGASTRODUODENOSCOPY (EGD) WITH PROPOFOL;  Surgeon: Scot Jun, MD;  Location: [facility] ENDOSCOPY;  Service: Endoscopy;  Laterality: N/A;  . NO PAST SURGERIES      Prior to Admission medications   Medication Sig Start Date End Date Taking? Authorizing Provider  aspirin EC 81 MG tablet Take 81 mg by mouth daily. Reported on 04/06/2016   Yes Historical Provider, MD  citalopram (CELEXA) 20 MG tablet TAKE 1 TABLET BY MOUTH EVERY DAY 04/08/16  Yes Audery Amel, MD  furosemide (LASIX) 20 MG tablet Take 1 tablet (20 mg total) by mouth 2 (  two) times daily. 02/12/16  Yes Enid Baasadhika Kalisetti, MD  losartan (COZAAR) 100 MG tablet Take 100 mg by mouth daily.   Yes Historical Provider, MD  metFORMIN (GLUCOPHAGE) 500 MG tablet Take 1 tablet (500 mg total) by mouth 2 (two) times daily with a meal. 11/13/15  Yes Katha HammingSnehalatha Konidena, MD  metoprolol  tartrate (LOPRESSOR) 25 MG tablet Take 25 mg by mouth 2 (two) times daily.   Yes Historical Provider, MD  Multiple Vitamin (MULTIVITAMIN WITH MINERALS) TABS tablet Take 1 tablet by mouth daily. 08/04/15  Yes Ramonita LabAruna Gouru, MD  nadolol (CORGARD) 40 MG tablet Take 1 tablet (40 mg total) by mouth daily. 02/12/16  Yes Enid Baasadhika Kalisetti, MD  OLANZapine (ZYPREXA) 20 MG tablet Take 1 tablet (20 mg total) by mouth at bedtime. 03/13/16  Yes Audery AmelJohn T [name], MD  omeprazole (PRILOSEC) 40 MG capsule Take 1 capsule (40 mg total) by mouth every morning. 02/12/16  Yes Enid Baasadhika Kalisetti, MD  spironolactone (ALDACTONE) 25 MG tablet Take 1 tablet (25 mg total) by mouth daily. 02/12/16  Yes Enid Baasadhika Kalisetti, MD    Allergies Review of patient's allergies indicates no known allergies.  Family History  Problem Relation Age of Onset  . Heart disease Mother   . Hypertension Mother   . Hyperlipidemia Mother   . Stroke Father   . Heart attack Father   . Hypertension Father   . Heart disease Father   . Alcohol abuse Father   . Heart disease Brother     Social History Social History  Substance Use Topics  . Smoking status: Never Smoker  . Smokeless tobacco: Never Used  . Alcohol use 48.0 oz/week    80 Cans of beer per week     Comment: last drink 04/19/16    Review of Systems Constitutional: No fever/chills Eyes: No visual changes. ENT: No sore throat. Cardiovascular: Denies chest pain. Respiratory: Denies shortness of breath. Gastrointestinal:  abdominal pain.  No nausea, no vomiting.  No diarrhea.  No constipation. Genitourinary: Negative for dysuria. Musculoskeletal: Negative for back pain. Skin: Negative for rash. Neurological: Negative for headaches, focal weakness or numbness.  10-point ROS otherwise negative.  ____________________________________________   PHYSICAL EXAM:  VITAL SIGNS: ED Triage Vitals  Enc Vitals Group     BP 04/27/16 1946 (!) 128/116     Pulse Rate 04/27/16 1946 87      Resp 04/27/16 1946 18     Temp 04/27/16 1946 98.3 F (36.8 C)     Temp Source 04/27/16 1946 Oral     SpO2 04/27/16 1946 95 %     Weight 04/27/16 1946 (!) 330 lb (149.7 kg)     Height 04/27/16 1946 6\' 3"  (1.905 m)     Head Circumference --      Peak Flow --      Pain Score 04/27/16 1947 8     Pain Loc --      Pain Edu? --      Excl. in GC? --     Constitutional: Alert and oriented. Well appearing and in no acute distress. Eyes: Conjunctivae are normal. PERRL. EOMI. Head: Atraumatic. Nose: No congestion/rhinnorhea. Mouth/Throat: Mucous membranes are moist.  Oropharynx non-erythematous. Neck: No stridor. Cardiovascular: Normal rate, regular rhythm. Grossly normal heart sounds.  Good peripheral circulation. Respiratory: Normal respiratory effort.  No retractions. Lungs CTAB. Gastrointestinal: Soft and Tender in the mid abdomen just left of the umbilicus. There is no bruising there No distention. No abdominal bruits. No CVA tenderness. Musculoskeletal: No lower  extremity tenderness nor edema.  No joint effusions. Neurologic:  Normal speech and language. No gross focal neurologic deficits are appreciated. No gait instability. Skin:  Skin is warm, dry and intact. No rash noted.  ____________________________________________   LABS (all labs ordered are listed, but only abnormal results are displayed)  Labs Reviewed  COMPREHENSIVE METABOLIC PANEL - Abnormal; Notable for the following:       Result Value   Glucose, Bld 290 (*)    Calcium 8.8 (*)    All other components within normal limits  CBC WITH DIFFERENTIAL/PLATELET - Abnormal; Notable for the following:    RBC 3.31 (*)    Hemoglobin 9.0 (*)    HCT 27.5 (*)    RDW 16.3 (*)    Platelets 127 (*)    All other components within normal limits  URINALYSIS COMPLETEWITH MICROSCOPIC ([facility] ONLY) - Abnormal; Notable for the following:    Color, Urine YELLOW (*)    APPearance CLEAR (*)    Glucose, UA >500 (*)    Hgb urine dipstick  3+ (*)    All other components within normal limits  AMMONIA - Abnormal; Notable for the following:    Ammonia 119 (*)    All other components within normal limits  URINE DRUG SCREEN, QUALITATIVE ([facility] ONLY)  LIPASE, BLOOD   ____________________________________________  EKG  EKG read and interpreted by me shows normal sinus rhythm rate of 89 left axis no acute ST-T wave changes ____________________________________________  RADIOLOGY  CT the head read by radiology as no acute disease CT the abdomen read by radiology as no acute disease there is persistent mesenteric edema and some small nodes. ____________________________________________   PROCEDURES  Procedure(s) performed:  Procedures  Critical Care performed  ____________________________________________   INITIAL IMPRESSION / ASSESSMENT AND PLAN / ED COURSE  Pertinent labs & imaging results that were available during my care of the patient were reviewed by me and considered in my medical decision making (see chart for details).  Patient's ammonia is 119 this may explain his weakness and frequent falls. His mental status seems to be clear however.  Clinical Course     ____________________________________________   FINAL CLINICAL IMPRESSION(S) / ED DIAGNOSES  Final diagnoses:  Fall at home, initial encounter  Hepatic encephalopathy (HCC)      NEW MEDICATIONS STARTED DURING THIS VISIT:  New Prescriptions   No medications on file     Note:  This document was prepared using Dragon voice recognition software and may include unintentional dictation errors.    Arnaldo Natal, MD 04/27/16 2346    Arnaldo Natal, MD 04/28/16 [phone]

## 2016-04-27 NOTE — ED Triage Notes (Signed)
Pt presents to ED with c/o a fall about 430pm today. Pt states "my body got so weak and just gave out on me". Pt reports multiple medical c/o, including DM, cirrhosis, HTN, and COPD. Pt presents with home O2 in place with flow rate of 2L/min. Pt reports abdominal pain r/t fall, denies head injury. Pt also reports "I fell in my room yesterday". Pt is A&OP, in NAD, with respirations even, regular and unlabored

## 2016-04-28 DIAGNOSIS — I8510 Secondary esophageal varices without bleeding: Secondary | ICD-10-CM | POA: Diagnosis not present

## 2016-04-28 LAB — CBC
HCT: 27 % — ABNORMAL LOW (ref 40.0–52.0)
HEMOGLOBIN: 9 g/dL — AB (ref 13.0–18.0)
MCH: 27.4 pg (ref 26.0–34.0)
MCHC: 33.2 g/dL (ref 32.0–36.0)
MCV: 82.4 fL (ref 80.0–100.0)
Platelets: 127 10*3/uL — ABNORMAL LOW (ref 150–440)
RBC: 3.27 MIL/uL — ABNORMAL LOW (ref 4.40–5.90)
RDW: 15.8 % — AB (ref 11.5–14.5)
WBC: 6 10*3/uL (ref 3.8–10.6)

## 2016-04-28 LAB — GLUCOSE, CAPILLARY
GLUCOSE-CAPILLARY: 119 mg/dL — AB (ref 65–99)
GLUCOSE-CAPILLARY: 146 mg/dL — AB (ref 65–99)
GLUCOSE-CAPILLARY: 165 mg/dL — AB (ref 65–99)
GLUCOSE-CAPILLARY: 120 mg/dL — AB (ref 65–99)
Glucose-Capillary: 71 mg/dL (ref 65–99)
Glucose-Capillary: 112 mg/dL — ABNORMAL HIGH (ref 65–99)

## 2016-04-28 LAB — PROTIME-INR
INR: 1.31
PROTHROMBIN TIME: 16.4 s — AB (ref 11.4–15.2)

## 2016-04-28 LAB — COMPREHENSIVE METABOLIC PANEL
ALBUMIN: 3.3 g/dL — AB (ref 3.5–5.0)
ALK PHOS: 92 U/L (ref 38–126)
ALT: 27 U/L (ref 17–63)
ANION GAP: 6 (ref 5–15)
AST: 29 U/L (ref 15–41)
BUN: 11 mg/dL (ref 6–20)
CALCIUM: 8.9 mg/dL (ref 8.9–10.3)
CHLORIDE: 108 mmol/L (ref 101–111)
CO2: 26 mmol/L (ref 22–32)
CREATININE: 0.62 mg/dL (ref 0.61–1.24)
GFR calc Af Amer: >60 mL/min (ref >60)
GFR calc non Af Amer: >60 mL/min (ref >60)
GLUCOSE: 117 mg/dL — AB (ref 65–99)
POTASSIUM: 3.9 mmol/L (ref 3.5–5.1)
SODIUM: 140 mmol/L (ref 135–145)
Total Bilirubin: 0.5 mg/dL (ref 0.3–1.2)
Total Protein: 7 g/dL (ref 6.5–8.1)

## 2016-04-28 LAB — AMMONIA: Ammonia: 81 umol/L — ABNORMAL HIGH (ref 9–35)

## 2016-04-28 LAB — APTT: aPTT: 36 seconds (ref 24–36)

## 2016-04-28 LAB — PHOSPHORUS: PHOSPHORUS: 4.5 mg/dL (ref 2.5–4.6)

## 2016-04-28 LAB — MAGNESIUM: Magnesium: 1.8 mg/dL (ref 1.7–2.4)

## 2016-04-28 MED ORDER — SPIRONOLACTONE 25 MG PO TABS
25.0000 mg | ORAL_TABLET | Freq: Every day | ORAL | Status: DC
  Administered 2016-04-28 – 2016-04-29 (×2): 25 mg via ORAL
  Filled 2016-04-28 (×2): qty 1

## 2016-04-28 MED ORDER — OXYCODONE-ACETAMINOPHEN 5-325 MG PO TABS
1.0000 | ORAL_TABLET | ORAL | Status: DC | PRN
  Administered 2016-04-28: 1 via ORAL
  Filled 2016-04-28: qty 1

## 2016-04-28 MED ORDER — PANTOPRAZOLE SODIUM 40 MG PO TBEC
40.0000 mg | DELAYED_RELEASE_TABLET | Freq: Every day | ORAL | Status: DC
  Administered 2016-04-28 – 2016-04-29 (×2): 40 mg via ORAL
  Filled 2016-04-28 (×2): qty 1

## 2016-04-28 MED ORDER — ADULT MULTIVITAMIN W/MINERALS CH
1.0000 | ORAL_TABLET | Freq: Every day | ORAL | Status: DC
  Administered 2016-04-28 – 2016-04-29 (×2): 1 via ORAL
  Filled 2016-04-28 (×2): qty 1

## 2016-04-28 MED ORDER — OLANZAPINE 10 MG PO TABS
20.0000 mg | ORAL_TABLET | Freq: Every day | ORAL | Status: DC
  Administered 2016-04-28: 20 mg via ORAL
  Filled 2016-04-28 (×2): qty 2

## 2016-04-28 MED ORDER — ONDANSETRON HCL 4 MG PO TABS: 4.0000 mg | ORAL_TABLET | Freq: Four times a day (QID) | ORAL | Status: DC | PRN

## 2016-04-28 MED ORDER — RIFAXIMIN 550 MG PO TABS
550.0000 mg | ORAL_TABLET | Freq: Two times a day (BID) | ORAL | Status: DC
  Administered 2016-04-28 – 2016-04-29 (×3): 550 mg via ORAL
  Filled 2016-04-28 (×3): qty 1

## 2016-04-28 MED ORDER — PNEUMOCOCCAL VAC POLYVALENT 25 MCG/0.5ML IJ INJ
0.5000 mL | INJECTION | INTRAMUSCULAR | Status: AC
  Administered 2016-04-29: 0.5 mL via INTRAMUSCULAR
  Filled 2016-04-28: qty 0.5

## 2016-04-28 MED ORDER — FUROSEMIDE 20 MG PO TABS
20.0000 mg | ORAL_TABLET | Freq: Two times a day (BID) | ORAL | Status: DC
  Administered 2016-04-28 – 2016-04-29 (×3): 20 mg via ORAL
  Filled 2016-04-28 (×3): qty 1

## 2016-04-28 MED ORDER — NADOLOL 20 MG PO TABS
40.0000 mg | ORAL_TABLET | Freq: Every day | ORAL | Status: DC
  Administered 2016-04-28 – 2016-04-29 (×2): 40 mg via ORAL
  Filled 2016-04-28 (×2): qty 2

## 2016-04-28 MED ORDER — METOPROLOL TARTRATE 25 MG PO TABS
25.0000 mg | ORAL_TABLET | Freq: Two times a day (BID) | ORAL | Status: DC
  Administered 2016-04-28 – 2016-04-29 (×3): 25 mg via ORAL
  Filled 2016-04-28 (×3): qty 1

## 2016-04-28 MED ORDER — LACTULOSE 10 GM/15ML PO SOLN
20.0000 g | Freq: Two times a day (BID) | ORAL | Status: DC
  Administered 2016-04-28 – 2016-04-29 (×3): 20 g via ORAL
  Filled 2016-04-28 (×4): qty 30

## 2016-04-28 MED ORDER — INSULIN ASPART 100 UNIT/ML ~~LOC~~ SOLN
0.0000 [IU] | Freq: Three times a day (TID) | SUBCUTANEOUS | Status: DC
  Administered 2016-04-28: 2 [IU] via SUBCUTANEOUS
  Filled 2016-04-28: qty 2

## 2016-04-28 MED ORDER — ASPIRIN EC 81 MG PO TBEC
81.0000 mg | DELAYED_RELEASE_TABLET | Freq: Every day | ORAL | Status: DC
  Administered 2016-04-28 – 2016-04-29 (×2): 81 mg via ORAL
  Filled 2016-04-28 (×2): qty 1

## 2016-04-28 MED ORDER — IBUPROFEN 100 MG PO CHEW
100.0000 mg | CHEWABLE_TABLET | Freq: Three times a day (TID) | ORAL | Status: DC | PRN
  Filled 2016-04-28: qty 1

## 2016-04-28 MED ORDER — ONDANSETRON HCL 4 MG/2ML IJ SOLN: 4.0000 mg | Freq: Four times a day (QID) | INTRAMUSCULAR | Status: DC | PRN

## 2016-04-28 MED ORDER — LOSARTAN POTASSIUM 50 MG PO TABS
100.0000 mg | ORAL_TABLET | Freq: Every day | ORAL | Status: DC
  Administered 2016-04-28 – 2016-04-29 (×2): 100 mg via ORAL
  Filled 2016-04-28 (×2): qty 2

## 2016-04-28 MED ORDER — IBUPROFEN 100 MG/5ML PO SUSP
100.0000 mg | Freq: Three times a day (TID) | ORAL | Status: DC | PRN
  Filled 2016-04-28: qty 5

## 2016-04-28 MED ORDER — CITALOPRAM HYDROBROMIDE 20 MG PO TABS
20.0000 mg | ORAL_TABLET | Freq: Every day | ORAL | Status: DC
  Administered 2016-04-28 – 2016-04-29 (×2): 20 mg via ORAL
  Filled 2016-04-28 (×2): qty 1

## 2016-04-28 NOTE — H&P (Signed)
[facility] [place] @ [facility] Admission History and Physical Tonye Royalty, D.O.  ---------------------------------------------------------------------------------------------------------------------   PATIENT NAME: Howard [name] MR#: [id number] DATE OF BIRTH: [birth year] DATE OF ADMISSION: 04/27/2016 PRIMARY CARE PHYSICIAN: Howard Ivan, MD  REQUESTING/REFERRING PHYSICIAN: ED Dr. Darnelle Catalan  CHIEF COMPLAINT: Chief Complaint  Patient presents with  . Fall    HISTORY OF PRESENT ILLNESS: Howard [name] is a 35 y.o. male with a known history of EtOH cirrhosis was in a usual state of health until he began falling about a week ago.  Mother states he has been falling a lot recently and today fell and landed on his belly, now complaining of abdominal pain which is mild and diffuse. He also admits to weakness and fatigue recently.    Otherwise there has been no change in status. Patient has been taking medication as prescribed including lactulose (which is not listed on his home med rec) and there has been no recent change in medication or diet.  There has been no recent illness, travel or sick contacts.    Patient denies fevers/chills, dizziness, chest pain, shortness of breath, N/V/C/D, dysuria/frequency, changes in mental status.   PAST MEDICAL HISTORY: Past Medical History:  Diagnosis Date  . Alcoholic cirrhosis of liver with ascites (HCC)   . Anemia   . COPD (chronic obstructive pulmonary disease) (HCC)   . Depression   . Diabetes (HCC)   . Diabetes mellitus, type II (HCC)   . Esophageal varices (HCC)   . Heart disease   . Hyperlipemia   . Hypertension   . Liver disease   . Multiple thyroid nodules   . Portal hypertensive gastropathy   . Schizophrenia (HCC)       PAST SURGICAL HISTORY: Past Surgical History:  Procedure Laterality Date  . ESOPHAGOGASTRODUODENOSCOPY N/A 10/05/2015   Procedure: ESOPHAGOGASTRODUODENOSCOPY (EGD);  Surgeon: Elnita Maxwell, MD;   Location: [facility] ENDOSCOPY;  Service: Endoscopy;  Laterality: N/A;  . ESOPHAGOGASTRODUODENOSCOPY (EGD) WITH PROPOFOL N/A 08/03/2015   Procedure: ESOPHAGOGASTRODUODENOSCOPY (EGD) WITH PROPOFOL;  Surgeon: Elnita Maxwell, MD;  Location: [facility] ENDOSCOPY;  Service: Endoscopy;  Laterality: N/A;  . ESOPHAGOGASTRODUODENOSCOPY (EGD) WITH PROPOFOL N/A 08/31/2015   Procedure: ESOPHAGOGASTRODUODENOSCOPY (EGD) WITH PROPOFOL;  Surgeon: Elnita Maxwell, MD;  Location: [facility] ENDOSCOPY;  Service: Endoscopy;  Laterality: N/A;  . ESOPHAGOGASTRODUODENOSCOPY (EGD) WITH PROPOFOL N/A 04/04/2016   Procedure: ESOPHAGOGASTRODUODENOSCOPY (EGD) WITH PROPOFOL;  Surgeon: Scot Jun, MD;  Location: [facility] ENDOSCOPY;  Service: Endoscopy;  Laterality: N/A;  . NO PAST SURGERIES        SOCIAL HISTORY: Social History  Substance Use Topics  . Smoking status: Never Smoker  . Smokeless tobacco: Never Used  . Alcohol use 48.0 oz/week    80 Cans of beer per week     Comment: last drink 04/19/16   Quit 2 months ago and relapsed briefly two weeks ago.    FAMILY HISTORY: Family History  Problem Relation Age of Onset  . Heart disease Mother   . Hypertension Mother   . Hyperlipidemia Mother   . Stroke Father   . Heart attack Father   . Hypertension Father   . Heart disease Father   . Alcohol abuse Father   . Heart disease Brother      MEDICATIONS AT HOME: Prior to Admission medications   Medication Sig Start Date End Date Taking? Authorizing Provider  aspirin EC 81 MG tablet Take 81 mg by mouth daily. Reported on 04/06/2016   Yes Historical Provider, MD  citalopram (CELEXA)  20 MG tablet TAKE 1 TABLET BY MOUTH EVERY DAY 04/08/16  Yes Audery Amel, MD  furosemide (LASIX) 20 MG tablet Take 1 tablet (20 mg total) by mouth 2 (two) times daily. 02/12/16  Yes Enid Baas, MD  losartan (COZAAR) 100 MG tablet Take 100 mg by mouth daily.   Yes Historical Provider, MD  metFORMIN (GLUCOPHAGE) 500 MG tablet Take 1  tablet (500 mg total) by mouth 2 (two) times daily with a meal. 11/13/15  Yes Katha Hamming, MD  metoprolol tartrate (LOPRESSOR) 25 MG tablet Take 25 mg by mouth 2 (two) times daily.   Yes Historical Provider, MD  Multiple Vitamin (MULTIVITAMIN WITH MINERALS) TABS tablet Take 1 tablet by mouth daily. 08/04/15  Yes Ramonita Lab, MD  nadolol (CORGARD) 40 MG tablet Take 1 tablet (40 mg total) by mouth daily. 02/12/16  Yes Enid Baas, MD  OLANZapine (ZYPREXA) 20 MG tablet Take 1 tablet (20 mg total) by mouth at bedtime. 03/13/16  Yes Audery Amel, MD  omeprazole (PRILOSEC) 40 MG capsule Take 1 capsule (40 mg total) by mouth every morning. 02/12/16  Yes Enid Baas, MD  spironolactone (ALDACTONE) 25 MG tablet Take 1 tablet (25 mg total) by mouth daily. 02/12/16  Yes Enid Baas, MD      DRUG ALLERGIES: No Known Allergies   REVIEW OF SYSTEMS: CONSTITUTIONAL: No fever/chills. (+) fatigue, weakness. No weight gain, no weight loss. EYES: No blurry or double vision. ENT: No tinnitus. No postnasal drip. No redness or soreness of the oropharynx. RESPIRATORY: No cough, no wheeze, no hemoptysis. No dyspnea. CARDIOVASCULAR: No chest pain. No orthopnea. No palpitations. No syncope. GASTROINTESTINAL: No nausea, no vomiting or diarrhea. (+) abdominal pain. No melena or hematochezia. GENITOURINARY: No dysuria or hematuria. ENDOCRINE: No polyuria or nocturia. No heat or cold intolerance. HEMATOLOGY: No anemia. No bruising. No bleeding. INTEGUMENTARY: No rashes. No lesions. MUSCULOSKELETAL: No arthritis. No swelling. No gout. NEUROLOGIC: No numbness, tingling, weakness or ataxia. No seizure-type activity. PSYCHIATRIC: No anxiety. No depression. No insomnia.  PHYSICAL EXAMINATION: VITAL SIGNS: Blood pressure (!) 157/80, pulse 82, temperature 98 F (36.7 C), temperature source Oral, resp. rate 20, height  (1.905 m), weight (!) 149.7 kg (330 lb), SpO2 97 %.  GENERAL: 35  y.o.-year-old white male patient, well-developed, well-nourished lying in the bed in no acute distress.  Fatigued.  EYES: Pupils equal, round, reactive to light and accommodation. No scleral icterus. Extraocular muscles intact. HEENT: Head atraumatic, normocephalic. Oropharynx and nasopharynx clear. Mucus membranes moist. NECK: Supple, full range of motion. No JVD, no bruit heard. No thyroid enlargement, no tenderness, no lymphadenopathy. CHEST: Normal breath sounds bilaterally, no wheezing, rales, rhonchi or crepitation. No use of accessory muscles of respiration.  No reproducible chest wall tenderness.  CARDIOVASCULAR: S1, S2 normal. No murmurs, rubs, or gallops. Cap refill <2 seconds. ABDOMEN: Soft, mild tenderness diffusely, nondistended. No rebound, guarding, rigidity. Normoactive bowel sounds present in all four quadrants. No organomegaly or mass. EXTREMITIES:  No pedal edema, cyanosis, or clubbing. NEUROLOGIC: Cranial nerves II through XII are grossly intact with no focal sensorimotor deficit. Muscle strength 5/5 in all extremities. Sensation intact. Gait not checked. PSYCHIATRIC: The patient is alert and oriented x 3. Normal affect, mood, thought content. SKIN: Warm, dry, and intact without obvious rash, lesion, or ulcer.  LABORATORY PANEL:  CBC  Recent Labs Lab 04/27/16 2109  WBC 6.3  HGB 9.0*  HCT 27.5*  PLT 127*   ----------------------------------------------------------------------------------------------------------------- Chemistries  Recent Labs Lab 04/27/16 2109  NA  138  K 4.1  CL 107  CO2 26  GLUCOSE 290*  BUN 11  CREATININE 0.79  CALCIUM 8.8*  AST 30  ALT 27  ALKPHOS 106  BILITOT 0.6   ------------------------------------------------------------------------------------------------------------------ Cardiac Enzymes No results for input(s): TROPONINI in the last 168  hours. ------------------------------------------------------------------------------------------------------------------  RADIOLOGY: Ct Head Wo Contrast  Result Date: 04/27/2016 CLINICAL DATA:  Two falls today and yesterday. Abdominal pain, unsteady gait. History of cirrhosis, hypertension, diabetes. EXAM: CT HEAD WITHOUT CONTRAST TECHNIQUE: Contiguous axial images were obtained from the base of the skull through the vertex without intravenous contrast. COMPARISON:  CT HEAD July 23, 2014 FINDINGS: INTRACRANIAL CONTENTS: The ventricles and sulci are normal. No intraparenchymal hemorrhage, mass effect nor midline shift. No acute large vascular territory infarcts. No abnormal extra-axial fluid collections. Basal cisterns are patent. ORBITS: The included ocular globes and orbital contents are normal. SINUSES: Trace sphenoid sinus air-fluid level. Mastoid air cells are well aerated. SKULL/SOFT TISSUES: No skull fracture. No significant soft tissue swelling. IMPRESSION: Negative CT HEAD. Electronically Signed   By: Awilda Metro M.D.   On: 04/27/2016 22:31   Ct Abdomen Pelvis W Contrast  Result Date: 04/27/2016 CLINICAL DATA:  Severe abdominal pain since fall today. Also fell yesterday. EXAM: CT ABDOMEN AND PELVIS WITH CONTRAST TECHNIQUE: Multidetector CT imaging of the abdomen and pelvis was performed using the standard protocol following bolus administration of intravenous contrast. CONTRAST:  ISOVUE-300 IOPAMIDOL (ISOVUE-300) INJECTION 61% COMPARISON:  CT 1 week prior 04/20/2016 FINDINGS: Lower chest: Scattered atelectasis. No pleural effusion. No fracture of the included ribs. Liver: Nodular contour consistent with cirrhosis. No evidence of acute traumatic injury. No focal lesion. There is recannulization of the umbilical vein. Hepatobiliary: Gallbladder decompressed.  No biliary dilatation. Pancreas: No evidence of traumatic injury. Spleen: Enlarged measuring 18.3 x 11.2 x 18.8 cm (volume =  2003.7 cc). No evidence of splenic injury. Trace perisplenic ascites is unchanged. Multiple left upper quadrant collaterals and varices. Adrenal glands: No hemorrhage. Kidneys: Symmetric renal enhancement and excretion. No hydronephrosis. No hydronephrosis. No traumatic injury. Stomach/Bowel: Stomach physiologically distended with ingested contents. There are no dilated or thickened small bowel loops. Small volume of stool throughout the colon without colonic wall thickening. The appendix is normal. No findings to suggest bowel injury. No mesenteric hematoma. Vascular/Lymphatic: No retroperitoneal fluid. The IVC and abdominal aorta are intact. Multiple collateral vessels in the abdomen. Splenorenal shunt is noted. There is mesenteric haziness and multiple prominent retroperitoneal and central mesenteric lymph nodes, unchanged from prior exam. Reproductive: No acute abnormality. Bladder: Minimally distended without acute traumatic injury. Other: Trace intra-abdominal ascites. No free air. No abdominal wall contusion. Minimal fat within both inguinal canals. Musculoskeletal: There are no acute or suspicious osseous abnormalities. Chronic minimal compression deformity versus prominent Schmorl's node superior endplate of T11. No fracture of the lumbar spine or bony pelvis. IMPRESSION: 1. No acute abnormality or acute traumatic injury in the abdomen or pelvis. 2. Cirrhosis with portal hypertension and splenomegaly. Multiple portosystemic collaterals and splenorenal shunting. Trace ascites. 3. Unchanged mesenteric edema with multiple small mesenteric and retroperitoneal lymph nodes. This may be secondary to cirrhosis and third-spacing. Electronically Signed   By: Rubye Oaks M.D.   On: 04/27/2016 22:34    EKG:  NSR @89bpm  nonspecific ST T wave changes.    IMPRESSION AND PLAN:  This is a 35 y.o. male  with a history of alcoholic liver cirrhosis, DM2, anemia, HTN, HLD,schizophrenia now being admitted with: 1.  Hepatic encephalopathy with falls -  patient reports compliance with medication however he has an increased ammonia level, fatigue, falls.   -Admit for observation, lactulose, PT eval and treat, repeat labs including ammonia in AM.    Fluids: [place] Diet/Nutrition: Heart healthy, carb controlled DVT Px: SCDs, will check coags and ambulate early with assistance.   All the records are reviewed and case discussed with ED provider. Management plans discussed with the patient and/or family who express understanding and agree with plan of care.  CODE STATUS: Full TOTAL TIME TAKING CARE OF THIS PATIENT: 50 minutes.   Howard [name] D.O. on 04/28/2016 at 1:34 AM Between 7am to 6pm - Pager - [phone] After 6pm go to [email or web address] - Social research officer, governmentpassword EPAS [facility] [place] [facility] Office [phone] CC: Primary care physician; Howard [name], [name], MD     Note: This dictation was prepared with Dragon dictation along with smaller phrase technology. Any transcriptional errors that result from this process are unintentional.

## 2016-04-28 NOTE — Progress Notes (Signed)
Pt c/o headache and rated pain as 10/10 on the anterior and left side, on call MD Pyreddy paged and ordered for Percocet 1 tab PRN every 4hrs for pain. Will administer and continue to monitor.

## 2016-04-28 NOTE — Progress Notes (Signed)
[facility] - [place] [facility]   PATIENT NAME: Howard [name]    MR#:  [id number]  DATE OF BIRTH:  September 09, 1981  SUBJECTIVE:  CHIEF COMPLAINT:   Chief Complaint  Patient presents with  . Fall  The patient is a 35 year old Caucasian male with past medical history significant for history of alcoholic liver cirrhosis who presents to the hospital with fall, abdominal pain, fatigue and weakness. On arrival to the hospital. His labs revealed elevated ammonia level to about 120 and he was initiated on Xifaxan and lactulose. He feels poorly today, complains of headache. He was never diagnosed with obstructive sleep apnea, but thinks that he may have one. Patient admits of having difficulty voiding, does not think that empty his bladder completely.   Review of Systems  Constitutional: Negative for chills, fever and weight loss.  HENT: Negative for congestion.   Eyes: Negative for blurred vision and double vision.  Respiratory: Positive for cough. Negative for sputum production, shortness of breath and wheezing.   Cardiovascular: Negative for chest pain, palpitations, orthopnea, leg swelling and PND.  Gastrointestinal: Negative for abdominal pain, blood in stool, constipation, diarrhea, nausea and vomiting.  Genitourinary: Negative for dysuria, frequency, hematuria and urgency.  Musculoskeletal: Negative for falls.  Neurological: Positive for weakness and headaches. Negative for dizziness, tremors and focal weakness.  Endo/Heme/Allergies: Does not bruise/bleed easily.  Psychiatric/Behavioral: Negative for depression. The patient does not have insomnia.     VITAL SIGNS: Blood pressure (!) 148/75, pulse 72, temperature 97.8 F (36.6 C), temperature source Oral, resp. rate 18, height  (1.905 m), weight (!) 154.9 kg (341 lb 9.6 oz), SpO2 98 %.  PHYSICAL EXAMINATION:   GENERAL:  35 y.o.-year-old obese patient lying in the bed with no acute distress. Somnolent and has  garbled speech EYES: Pupils equal, round, reactive to light and accommodation. No scleral icterus. Extraocular muscles intact.  HEENT: Head atraumatic, normocephalic. Oropharynx and nasopharynx clear.  NECK:  Supple, no jugular venous distention. No thyroid enlargement, no tenderness.  LUNGS: Normal breath sounds bilaterally, no wheezing, rales,rhonchi or crepitation. No use of accessory muscles of respiration.  CARDIOVASCULAR: S1, S2 normal. No murmurs, rubs, or gallops.  ABDOMEN: Soft, mild discomfort to palpation in suprapubic area but no rebound or guarding, nondistended. Bowel sounds present. No organomegaly or mass.  EXTREMITIES: No pedal edema, cyanosis, or clubbing.  NEUROLOGIC: Cranial nerves II through XII are intact. Muscle strength 5/5 in all extremities. Sensation intact. Gait not checked.  PSYCHIATRIC: The patient is somnolent, and oriented x 2.  SKIN: No obvious rash, lesion, or ulcer.   ORDERS/RESULTS REVIEWED:   CBC  Recent Labs Lab 04/27/16 2109 04/28/16 0432  WBC 6.3 6.0  HGB 9.0* 9.0*  HCT 27.5* 27.0*  PLT 127* 127*  MCV 83.0 82.4  MCH 27.1 27.4  MCHC 32.7 33.2  RDW 16.3* 15.8*  LYMPHSABS 1.4  --   MONOABS 0.6  --   EOSABS 0.2  --   BASOSABS 0.0  --    ------------------------------------------------------------------------------------------------------------------  Chemistries   Recent Labs Lab 04/27/16 2109 04/28/16 0432  NA 138 140  K 4.1 3.9  CL 107 108  CO2 26 26  GLUCOSE 290* 117*  BUN 11 11  CREATININE 0.79 0.62  CALCIUM 8.8* 8.9  MG  --  1.8  AST 30 29  ALT 27 27  ALKPHOS 106 92  BILITOT 0.6 0.5   ------------------------------------------------------------------------------------------------------------------ estimated creatinine clearance is 201.5 mL/min (by C-G formula based on SCr of  0.8 mg/dL). ------------------------------------------------------------------------------------------------------------------ No results for  input(s): TSH, T4TOTAL, T3FREE, THYROIDAB in the last 72 hours.  Invalid input(s): FREET3  Cardiac Enzymes No results for input(s): CKMB, TROPONINI, MYOGLOBIN in the last 168 hours.  Invalid input(s): CK ------------------------------------------------------------------------------------------------------------------ Invalid input(s): POCBNP ---------------------------------------------------------------------------------------------------------------  RADIOLOGY: Ct Head Wo Contrast  Result Date: 04/27/2016 CLINICAL DATA:  Two falls today and yesterday. Abdominal pain, unsteady gait. History of cirrhosis, hypertension, diabetes. EXAM: CT HEAD WITHOUT CONTRAST TECHNIQUE: Contiguous axial images were obtained from the base of the skull through the vertex without intravenous contrast. COMPARISON:  CT HEAD July 23, 2014 FINDINGS: INTRACRANIAL CONTENTS: The ventricles and sulci are normal. No intraparenchymal hemorrhage, mass effect nor midline shift. No acute large vascular territory infarcts. No abnormal extra-axial fluid collections. Basal cisterns are patent. ORBITS: The included ocular globes and orbital contents are normal. SINUSES: Trace sphenoid sinus air-fluid level. Mastoid air cells are well aerated. SKULL/SOFT TISSUES: No skull fracture. No significant soft tissue swelling. IMPRESSION: Negative CT HEAD. Electronically Signed   By: Awilda Metroourtnay  Bloomer M.D.   On: 04/27/2016 22:31   Ct Abdomen Pelvis W Contrast  Result Date: 04/27/2016 CLINICAL DATA:  Severe abdominal pain since fall today. Also fell yesterday. EXAM: CT ABDOMEN AND PELVIS WITH CONTRAST TECHNIQUE: Multidetector CT imaging of the abdomen and pelvis was performed using the standard protocol following bolus administration of intravenous contrast. CONTRAST:  125mL ISOVUE-300 IOPAMIDOL (ISOVUE-300) INJECTION 61% COMPARISON:  CT 1 week prior 04/20/2016 FINDINGS: Lower chest: Scattered atelectasis. No pleural effusion. No fracture of  the included ribs. Liver: Nodular contour consistent with cirrhosis. No evidence of acute traumatic injury. No focal lesion. There is recannulization of the umbilical vein. Hepatobiliary: Gallbladder decompressed.  No biliary dilatation. Pancreas: No evidence of traumatic injury. Spleen: Enlarged measuring 18.3 x 11.2 x 18.8 cm (volume = 2003.7 cc). No evidence of splenic injury. Trace perisplenic ascites is unchanged. Multiple left upper quadrant collaterals and varices. Adrenal glands: No hemorrhage. Kidneys: Symmetric renal enhancement and excretion. No hydronephrosis. No hydronephrosis. No traumatic injury. Stomach/Bowel: Stomach physiologically distended with ingested contents. There are no dilated or thickened small bowel loops. Small volume of stool throughout the colon without colonic wall thickening. The appendix is normal. No findings to suggest bowel injury. No mesenteric hematoma. Vascular/Lymphatic: No retroperitoneal fluid. The IVC and abdominal aorta are intact. Multiple collateral vessels in the abdomen. Splenorenal shunt is noted. There is mesenteric haziness and multiple prominent retroperitoneal and central mesenteric lymph nodes, unchanged from prior exam. Reproductive: No acute abnormality. Bladder: Minimally distended without acute traumatic injury. Other: Trace intra-abdominal ascites. No free air. No abdominal wall contusion. Minimal fat within both inguinal canals. Musculoskeletal: There are no acute or suspicious osseous abnormalities. Chronic minimal compression deformity versus prominent Schmorl's node superior endplate of T11. No fracture of the lumbar spine or bony pelvis. IMPRESSION: 1. No acute abnormality or acute traumatic injury in the abdomen or pelvis. 2. Cirrhosis with portal hypertension and splenomegaly. Multiple portosystemic collaterals and splenorenal shunting. Trace ascites. 3. Unchanged mesenteric edema with multiple small mesenteric and retroperitoneal lymph nodes. This  may be secondary to cirrhosis and third-spacing. Electronically Signed   By: Rubye OaksMelanie  Ehinger M.D.   On: 04/27/2016 22:34    EKG:  Orders placed or performed during the hospital encounter of 04/27/16  . EKG 12-Lead  . EKG 12-Lead    ASSESSMENT AND PLAN:  Active Problems:   Hepatic encephalopathy (HCC) #1. Hepatic encephalopathy, continue patient on Xifaxan and lactulose, follow clinically. Get physical therapist involved for  recommendations #2. Headache, suspected obstructive sleep apnea related, initiate patient on CPAP as needed, follow clinically, ibuprofen as needed #3. Diabetes mellitus type 2. Continue outpatient medications, sliding scale insulin #4. Anemia, thrombocytopenia, stable, likely liver disease/hypersplenism/alcohol Abuse related #5, difficulty urinating, getting bladder scan to rule out retention. CT of abdomen and pelvis revealed trace ascites, liver cirrhosis, splenomegaly, no acute findings.  Management plans discussed with the patient, family and they are in agreement.   DRUG ALLERGIES: No Known Allergies  CODE STATUS:     Code Status Orders        Start     Ordered   04/28/16 0254  Full code  Continuous     04/28/16 0253    Code Status History    Date Active Date Inactive Code Status Order ID Comments User Context   03/14/2016 12:30 PM 03/16/2016  3:41 PM Full Code [id number]  Shaune Pollack, MD Inpatient   03/03/2016 12:13 PM 03/06/2016  2:26 PM Full Code [id number]  Milagros Loll, MD ED   02/10/2016  5:54 PM 02/12/2016  7:34 PM Full Code [id number]  Ramonita Lab, MD Inpatient   11/11/2015  5:16 AM 11/13/2015  6:00 PM Full Code [id number]  Ihor Austin, MD Inpatient   11/07/2015 10:02 PM 11/09/2015  4:56 PM Full Code [id number]  Enid Baas, MD Inpatient   10/28/2015  8:14 PM 10/29/2015  2:26 PM Full Code [id number]  Altamese Dilling, MD Inpatient   10/16/2015 11:47 PM 10/17/2015  6:00 PM Full Code [id number]  Oralia Manis, MD Inpatient   07/30/2015  5:35 PM  08/04/2015  8:34 PM Full Code [id number]  Enedina Finner, MD Inpatient   07/05/2015  1:16 AM 07/08/2015  7:22 PM Full Code [id number]  Oralia Manis, MD Inpatient   05/30/2015  7:28 PM 06/01/2015  3:36 PM Full Code [id number]  Audery Amel, MD Inpatient      TOTAL TIME TAKING CARE OF THIS PATIENT: 35 minutes.    Katharina Caper M.D on 04/28/2016 at 2:24 PM  Between 7am to 6pm - Pager - [phone]  After 6pm go to [email or web address] - password [email or web address] [facility]  [facility] [place] [facility] [facility]  Office  [phone]  CC: Primary care physician; Marisue Ivan, MD

## 2016-04-28 NOTE — Progress Notes (Signed)
Initial Nutrition Assessment  DOCUMENTATION CODES:   Morbid obesity  INTERVENTION:  -monitor intake and cater to pt preferences within diet restrictions -Have educated pt and mother on previous admission regarding diet restrictions, discussed briefly this am (pt falling asleep during visit)   NUTRITION DIAGNOSIS:    (none at this time) related to   as evidenced by  .    GOAL:   Patient will meet greater than or equal to 90% of their needs    MONITOR:   PO intake, Labs, Weight trends  REASON FOR ASSESSMENT:   Malnutrition Screening Tool    ASSESSMENT:      Pt admitted with falling, hepatic encephalopathy, weakness, fatigue  Past Medical History:  Diagnosis Date  . Alcoholic cirrhosis of liver with ascites (HCC)   . Anemia   . COPD (chronic obstructive pulmonary disease) (HCC)   . Depression   . Diabetes (HCC)   . Diabetes mellitus, type II (HCC)   . Esophageal varices (HCC)   . Heart disease   . Hyperlipemia   . Hypertension   . Liver disease   . Multiple thyroid nodules   . Portal hypertensive gastropathy   . Schizophrenia (HCC)    Pt falling asleep during visit this am. Reports that he skips meals, typically eats breakfast and supper.  Ate banana, yogurt and bagel with cream cheese this am (100% of meal).    Medications reviewed: lasix, lactulose, aspart, MVI,  Labs reviewed: glucose 117  Diet Order:  Diet heart healthy/carb modified Room service appropriate? Yes; Fluid consistency: Thin  Skin:  Reviewed, no issues  Last BM:  8/6  Height: Pt reports wt loss but per wt encounters wt increase  Ht Readings from Last 1 Encounters:  04/28/16 6\' 3"  (1.905 m)    Weight:   Wt Readings from Last 1 Encounters:  04/28/16 (!) 341 lb 9.6 oz (154.9 kg)    Ideal Body Weight:     BMI:  Body mass index is 42.7 kg/m.  Estimated Nutritional Needs:   Kcal:  2225-2670 kcals/d (Using IBW of 89kg)  Protein:  89-107 g/d  Fluid:  >/=  221900ml/d  EDUCATION NEEDS:   Education needs no appropriate at this time  Murphy Duzan B. Freida BusmanAllen, RD, LDN [phone] (pager) Weekend/On-Call pager [phone])

## 2016-04-28 NOTE — Progress Notes (Signed)
[facility]  Patient Status: Active  [facility] is providing the following services: SN  If patient discharges after hours, please call [phone].   Howard CaseyJason E [name] 04/28/2016, 12:14 PM

## 2016-04-28 NOTE — Care Management Obs Status (Signed)
MEDICARE OBSERVATION STATUS NOTIFICATION   Patient Details  Name: Howard LinemanJoey A Busser MRN: [id number] Date of Birth: June 22, 1981   Medicare Observation Status Notification Given:  Yes    Adonis HugueninBerkhead, Kyren Vaux L, RN 04/28/2016, 11:12 AM

## 2016-04-28 NOTE — Progress Notes (Signed)
New Admission Note:   Arrival Method: per bed from ED, pt came from home Mental Orientation: alert and oriented X4 Telemetry: none ordered Assessment: Completed Skin: warm, dry, with old abrasion noted on left lower leg [facility], intact Pain: 10/10 pain score on the anterior and left side of the head. Will page MD for pain medicine. Safety Measures: Safety Fall Prevention Plan has been given and discussed Admission: Completed 1A Orientation: Patient has been oriented to the room, unit and staff.  Family: no family member at the bedside as of this time  Orders have been reviewed and implemented. Will continue to monitor the patient. Call light has been placed within reach and bed alarm has been activated.   Janice NorrieAnessa Dallys Nowakowski BSN, RN [facility] 1A

## 2016-04-28 NOTE — Care Management Note (Signed)
Case Management Note  Patient Details  Name: Howard LinemanJoey A Marrocco MRN: [id number] Date of Birth: Mar 08, 1981  Subjective/Objective:     Spoke with patient who is alert and answers questions appropriately. Patient is from home with his mother. He is disabled with a history of schizophrenia.  Patient stated that he has a nurse at home with [facility]. He stated that he has a walker, cane, and w/c. Home O2 is with [facility].  Will resume home health at discharge. No equipment needs, Patient denies issues with medications. NO immediate CM needs Noted.            Action/Plan: Home with continued services.   Expected Discharge Date:                  Expected Discharge Plan:  Home w Home Health Services  In-House Referral:     Discharge planning Services  CM Consult  Post Acute Care Choice:  Resumption of Svcs/PTA Provider Choice offered to:  NA  DME Arranged:  N/A DME Agency:     HH Arranged:  NA HH Agency:  [facility]  Status of Service:  In process, will continue to follow  If discussed at Long Length of Stay Meetings, dates discussed:    Additional Comments:  Adonis HugueninBerkhead, Saidi Santacroce L, RN 04/28/2016, 11:13 AM

## 2016-04-28 NOTE — Evaluation (Signed)
Physical Therapy Evaluation Patient Details Name: Howard [name] MRN: [id number] DOB: 01-18-1981 Today's Date: 04/28/2016   History of Present Illness  Pt admitted after multiple falls and reported chest Px. He has a complex medical Hx including HTN, EtOH abuse and cirrhoses, ARF, hepatic encephalopathy, T2DM, HLD, COPD, CAD, and paranoid schizophrenia  Clinical Impression  Howard [name] is a pleasant 35 y/o male who presents with general weakness and impaired balance. He is min assist with all functional mobility and required several rest breaks with ambulation including sitting breaks X 2. Pt experienced desat and mild orthostatic hypotension with standing; BP rebounded slightly with ambulation. Gait is abnormal with wide BOS, and jerky steps with reduced stride length and prolonged double support time. Pt has impaired standing balance and requires RW for safe ambulation. Pt is appropriate for skilled PT at this time to address deficits in strength, balance, coordination, endurance, gait, activity tolerance, and safe use of DME. Recommend HHPT to address deficits.   Sitting          128/76 Standing      112/58      85 Ambulating   117/53   76  O2 sats 89-94% on RA     Follow Up Recommendations Home health PT    Equipment Recommendations  None recommended by PT    Recommendations for Other Services       Precautions / Restrictions Precautions Precautions: Fall      Mobility  Bed Mobility Overal bed mobility: Needs Assistance Bed Mobility: Supine to Sit     Supine to sit: Min assist     General bed mobility comments: Required hand held assist to pull himself up with B UEs  Transfers Overall transfer level: Independent Equipment used: Rolling walker (2 wheeled)             General transfer comment: Pt did not rely on RW for assist to stand; required verbal cues for safe technique when sitting  Ambulation/Gait Ambulation/Gait assistance: Min assist;+2  safety/equipment Ambulation Distance (Feet): 250 Feet Assistive device: Rolling walker (2 wheeled) Gait Pattern/deviations: Step-through pattern;Decreased stride length;Shuffle;Wide base of support     General Gait Details: Pt has wide BOS and increased stanse time with shortened stride length and jerky steps. He required several standing breaks and sit down X 2 due to mild/mod drop in BP upon standing, with slight recovery upon ambulation, and O2 desat 89-94% on RA with exertion  Stairs            Wheelchair Mobility    Modified Rankin (Stroke Patients Only)       Balance Overall balance assessment: Modified Independent Sitting-balance support: No upper extremity supported;Feet supported Sitting balance-Leahy Scale: Good     Standing balance support: No upper extremity supported Standing balance-Leahy Scale: Fair Standing balance comment: Pt can tollerate very minor perturbation in A and P directions, and moderate perturbations in M and L directions               High Level Balance Comments: able to march in place w/o LOB with small steps             Pertinent Vitals/Pain Pain Assessment: No/denies pain    Home Living Family/patient expects to be discharged to:: Private residence Living Arrangements: Parent Available Help at Discharge: Family;Available 24 hours/day Type of Home: House Home Access: Stairs to enter Entrance Stairs-Rails: Can reach both Entrance Stairs-Number of Steps: 5 Home Layout: One level Home Equipment: Walker - 2 wheels;Cane -  single point;Bedside commode      Prior Function Level of Independence: Independent with assistive device(s)         Comments: Pt ambulates household/limited community distances with RW and O2; reports he bought an Production assistant, radioelectric chair     Hand Dominance        Extremity/Trunk Assessment   Upper Extremity Assessment: Generalized weakness (At least 4/5 functionally assessed)           Lower  Extremity Assessment: Generalized weakness (At leaset 4/5 functionally assessed)      Cervical / Trunk Assessment: Normal  Communication   Communication: No difficulties  Cognition Arousal/Alertness: Awake/alert Behavior During Therapy: WFL for tasks assessed/performed Overall Cognitive Status: History of cognitive impairments - at baseline                      General Comments      Exercises Other Exercises Other Exercises: Therapeutic activity: Use of rathroom with standing balance and dual tasking with CGA and verbal cuing for safe technique       Assessment/Plan    PT Assessment Patient needs continued PT services  PT Diagnosis Difficulty walking;Generalized weakness;Abnormality of gait   PT Problem List Decreased strength;Decreased range of motion;Decreased activity tolerance;Decreased balance;Decreased mobility;Decreased coordination;Decreased cognition;Decreased safety awareness;Cardiopulmonary status limiting activity;Obesity  PT Treatment Interventions DME instruction;Gait training;Stair training;Functional mobility training;Therapeutic exercise;Therapeutic activities;Balance training;Neuromuscular re-education;Cognitive remediation   PT Goals (Current goals can be found in the Care Plan section) Acute Rehab PT Goals Patient Stated Goal: Go home PT Goal Formulation: With patient Time For Goal Achievement: 05/12/16 Potential to Achieve Goals: Good    Frequency Min 2X/week   Barriers to discharge        Co-evaluation               End of Session Equipment Utilized During Treatment: Gait belt Activity Tolerance: Patient limited by fatigue;Treatment limited secondary to medical complications (Comment) (Slight orthostatic hypotention with O2 desat 89-94%) Patient left: in chair;with call bell/phone within reach;with chair alarm set;with family/visitor present Nurse Communication: Mobility status         Time: [id number] PT Time Calculation (min)  (ACUTE ONLY): 27 min   Charges:   PT Evaluation $PT Eval Low Complexity: 1 Procedure PT Treatments $Therapeutic Activity: 8-22 mins   PT G Codes:        Howard [name] 04/28/2016, 1:40 PM Howard SmilesDevan M Maleta [name], SPT [phone]

## 2016-04-29 DIAGNOSIS — D696 Thrombocytopenia, unspecified: Secondary | ICD-10-CM

## 2016-04-29 DIAGNOSIS — I8510 Secondary esophageal varices without bleeding: Secondary | ICD-10-CM | POA: Diagnosis not present

## 2016-04-29 DIAGNOSIS — G4733 Obstructive sleep apnea (adult) (pediatric): Secondary | ICD-10-CM

## 2016-04-29 DIAGNOSIS — D689 Coagulation defect, unspecified: Secondary | ICD-10-CM

## 2016-04-29 DIAGNOSIS — E669 Obesity, unspecified: Secondary | ICD-10-CM

## 2016-04-29 DIAGNOSIS — D649 Anemia, unspecified: Secondary | ICD-10-CM

## 2016-04-29 LAB — GLUCOSE, CAPILLARY
GLUCOSE-CAPILLARY: 89 mg/dL (ref 65–99)
GLUCOSE-CAPILLARY: 140 mg/dL — AB (ref 65–99)

## 2016-04-29 MED ORDER — RIFAXIMIN 550 MG PO TABS: 550.0000 mg | ORAL_TABLET | Freq: Two times a day (BID) | ORAL | 6 refills | Status: DC

## 2016-04-29 MED ORDER — LACTULOSE 10 GM/15ML PO SOLN: 20.0000 g | Freq: Two times a day (BID) | ORAL | 5 refills | Status: DC

## 2016-04-29 NOTE — Care Management (Signed)
Patient will discharge today with resumption of HH with [facility]. Howard GottronJason [name] notified of discharge.

## 2016-04-29 NOTE — Discharge Summary (Signed)
[facility] [place] [facility]   PATIENT NAME: Howard [name]    MR#:  [id number]  DATE OF BIRTH:  [birth year]  DATE OF ADMISSION:  04/27/2016 ADMITTING PHYSICIAN: Delfino Lovett, MD  DATE OF DISCHARGE: No discharge date for patient encounter.  PRIMARY CARE PHYSICIAN: Marisue Ivan, MD     ADMISSION DIAGNOSIS:  Hepatic encephalopathy (HCC) [K72.90] Fall at home, initial encounter [W19.Lorne Skeens, Y92.099]  DISCHARGE DIAGNOSIS:  Principal Problem:   Hepatic encephalopathy (HCC) Active Problems:   Headache   OSA (obstructive sleep apnea)   Obesity   Anemia   Thrombocytopenia (HCC)   Coagulopathy (HCC)   Generalized weakness   SECONDARY DIAGNOSIS:   Past Medical History:  Diagnosis Date  . Alcoholic cirrhosis of liver with ascites (HCC)   . Anemia   . COPD (chronic obstructive pulmonary disease) (HCC)   . Depression   . Diabetes (HCC)   . Diabetes mellitus, type II (HCC)   . Esophageal varices (HCC)   . Heart disease   . Hyperlipemia   . Hypertension   . Liver disease   . Multiple thyroid nodules   . Portal hypertensive gastropathy   . Schizophrenia (HCC)     .pro HOSPITAL COURSE:  The patient is a 35 year old Caucasian male with past medical history significant for history of alcoholic liver cirrhosis who presents to the hospital with fall, abdominal pain, fatigue and weakness. On arrival to the hospital. His labs revealed elevated ammonia level to about 120 and he was initiated on Xifaxan and lactulose and improved clinically. While in the hospital, he admitted of snoring, poor sleep, frequent awakenings and had some headaches. He was never diagnosed with obstructive sleep apnea, but thinks that he may have one. Outpatient sleep study was recommended upon discharge. He was seen by physical therapist and recommended to resume outpatient. Home health services. Patient was felt to be stable to be discharged home today.   Discussion by  problem: #1. Hepatic encephalopathy, resolved on  Xifaxan and lactulose, follow clinically as outpatient. Continue home health services as outpatient. #2. Headache, suspected due to obstructive sleep apnea , the patient may benefit from sleep study as outpatient to rule out obstructive sleep apnea #3. Diabetes mellitus type 2. Continue outpatient medications, no changes were made. #4. Anemia, thrombocytopenia, stable, likely liver disease/hypersplenism/alcohol Abuse related, follow as outpatient closely #5, difficulty urinating, bladder scan was about 200 cc, no obvious retention, suspected hepatic encephalopathy related, resolved by the day of discharge. CT of abdomen and pelvis revealed trace ascites, liver cirrhosis, splenomegaly, no acute findings #6. Generalized weakness, patient was seen by physical therapist and recommended to resume home health services including PT and RN DISCHARGE CONDITIONS:   Stable  CONSULTS OBTAINED:    DRUG ALLERGIES:  No Known Allergies  DISCHARGE MEDICATIONS:   Current Discharge Medication List    START taking these medications   Details  lactulose (CHRONULAC) 10 GM/15ML solution Take 30 mLs (20 g total) by mouth 2 (two) times daily. Qty: 946 mL, Refills: 5    rifaximin (XIFAXAN) 550 MG TABS tablet Take 1 tablet (550 mg total) by mouth 2 (two) times daily. Qty: 60 tablet, Refills: 6      CONTINUE these medications which have NOT CHANGED   Details  aspirin EC 81 MG tablet Take 81 mg by mouth daily. Reported on 04/06/2016    citalopram (CELEXA) 20 MG tablet TAKE 1 TABLET BY MOUTH EVERY DAY Qty: 90 tablet, Refills: 1    furosemide (  LASIX) 20 MG tablet Take 1 tablet (20 mg total) by mouth 2 (two) times daily. Qty: 30 tablet, Refills: 2    losartan (COZAAR) 100 MG tablet Take 100 mg by mouth daily.    metFORMIN (GLUCOPHAGE) 500 MG tablet Take 1 tablet (500 mg total) by mouth 2 (two) times daily with a meal. Qty: 30 tablet, Refills: 0     metoprolol tartrate (LOPRESSOR) 25 MG tablet Take 25 mg by mouth 2 (two) times daily.    Multiple Vitamin (MULTIVITAMIN WITH MINERALS) TABS tablet Take 1 tablet by mouth daily. Qty: 30 tablet, Refills: 0    nadolol (CORGARD) 40 MG tablet Take 1 tablet (40 mg total) by mouth daily. Qty: 30 tablet, Refills: 2    OLANZapine (ZYPREXA) 20 MG tablet Take 1 tablet (20 mg total) by mouth at bedtime. Qty: 90 tablet, Refills: 1    omeprazole (PRILOSEC) 40 MG capsule Take 1 capsule (40 mg total) by mouth every morning. Qty: 30 capsule, Refills: 2    spironolactone (ALDACTONE) 25 MG tablet Take 1 tablet (25 mg total) by mouth daily. Qty: 30 tablet, Refills: 2         DISCHARGE INSTRUCTIONS:    Patient is to follow-up with his primary care physician as outpatient  If you experience worsening of your admission symptoms, develop shortness of breath, life threatening emergency, suicidal or homicidal thoughts you must seek medical attention immediately by calling 911 or calling your MD immediately  if symptoms less severe.  You Must read complete instructions/literature along with all the possible adverse reactions/side effects for all the Medicines you take and that have been prescribed to you. Take any new Medicines after you have completely understood and accept all the possible adverse reactions/side effects.   Please note  You were cared for by a hospitalist during your hospital stay. If you have any questions about your discharge medications or the care you received while you were in the hospital after you are discharged, you can call the unit and asked to speak with the hospitalist on call if the hospitalist that took care of you is not available. Once you are discharged, your primary care physician will handle any further medical issues. Please note that NO REFILLS for any discharge medications will be authorized once you are discharged, as it is imperative that you return to your primary  care physician (or establish a relationship with a primary care physician if you do not have one) for your aftercare needs so that they can reassess your need for medications and monitor your lab values.    Today   CHIEF COMPLAINT:   Chief Complaint  Patient presents with  . Fall    HISTORY OF PRESENT ILLNESS:  Ernst BowlerJoey Tirado  is a 35 y.o. male with a known history of alcoholic liver cirrhosis who presents to the hospital with fall, abdominal pain, fatigue and weakness. On arrival to the hospital. His labs revealed elevated ammonia level to about 120 and he was initiated on Xifaxan and lactulose and improved clinically. While in the hospital, he admitted of snoring, poor sleep, frequent awakenings and had some headaches. He was never diagnosed with obstructive sleep apnea, but thinks that he may have one. Outpatient sleep study was recommended upon discharge. He was seen by physical therapist and recommended to resume outpatient. Home health services. Patient was felt to be stable to be discharged home today.   Discussion by problem: #1. Hepatic encephalopathy, resolved on  Xifaxan and lactulose, follow clinically  as outpatient. Continue home health services as outpatient. #2. Headache, suspected due to obstructive sleep apnea , the patient may benefit from sleep study as outpatient to rule out obstructive sleep apnea #3. Diabetes mellitus type 2. Continue outpatient medications, no changes were made. #4. Anemia, thrombocytopenia, stable, likely liver disease/hypersplenism/alcohol Abuse related, follow as outpatient closely #5, difficulty urinating, bladder scan was about 200 cc, no obvious retention, suspected hepatic encephalopathy related, resolved by the day of discharge. CT of abdomen and pelvis revealed trace ascites, liver cirrhosis, splenomegaly, no acute findings #6. Generalized weakness, patient was seen by physical therapist and recommended to resume home health services including PT and  RN    VITAL SIGNS:  Blood pressure 137/73, pulse 65, temperature 97.9 F (36.6 C), temperature source Oral, resp. rate 20, height 6\' 3"  (1.905 m), weight (!) 154.9 kg (341 lb 9.6 oz), SpO2 96 %.  I/O:   Intake/Output Summary (Last 24 hours) at 04/29/16 1402 Last data filed at 04/29/16 0900  Gross per 24 hour  Intake              240 ml  Output                0 ml  Net              240 ml    PHYSICAL EXAMINATION:  GENERAL:  35 y.o.-year-old patient lying in the bed with no acute distress.  EYES: Pupils equal, round, reactive to light and accommodation. No scleral icterus. Extraocular muscles intact.  HEENT: Head atraumatic, normocephalic. Oropharynx and nasopharynx clear.  NECK:  Supple, no jugular venous distention. No thyroid enlargement, no tenderness.  LUNGS: Normal breath sounds bilaterally, no wheezing, rales,rhonchi or crepitation. No use of accessory muscles of respiration.  CARDIOVASCULAR: S1, S2 normal. No murmurs, rubs, or gallops.  ABDOMEN: Soft, non-tender, non-distended. Bowel sounds present. No organomegaly or mass.  EXTREMITIES: No pedal edema, cyanosis, or clubbing.  NEUROLOGIC: Cranial nerves II through XII are intact. Muscle strength 5/5 in all extremities. Sensation intact. Gait not checked.  PSYCHIATRIC: The patient is alert and oriented x 3.  SKIN: No obvious rash, lesion, or ulcer.   DATA REVIEW:   CBC  Recent Labs Lab 04/28/16 0432  WBC 6.0  HGB 9.0*  HCT 27.0*  PLT 127*    Chemistries   Recent Labs Lab 04/28/16 0432  NA 140  K 3.9  CL 108  CO2 26  GLUCOSE 117*  BUN 11  CREATININE 0.62  CALCIUM 8.9  MG 1.8  AST 29  ALT 27  ALKPHOS 92  BILITOT 0.5    Cardiac Enzymes No results for input(s): TROPONINI in the last 168 hours.  Microbiology Results  Results for orders placed or performed during the hospital encounter of 03/03/16  Blood culture (routine x 2)     Status: None   Collection Time: 03/03/16  8:48 AM  Result Value Ref  Range Status   Specimen Description BLOOD RIGHT [facility]  Final   Special Requests   Final    BOTTLES DRAWN AEROBIC AND ANAEROBIC AER ANA   Culture NO GROWTH 8 DAYS  Final   Report Status 03/11/2016 FINAL  Final  Blood culture (routine x 2)     Status: None   Collection Time: 03/03/16  8:48 AM  Result Value Ref Range Status   Specimen Description BLOOD LEFT HAND  Final   Special Requests BOTTLES DRAWN AEROBIC AND ANAEROBIC 11 ML  Final   Culture  NO GROWTH 8 DAYS  Final   Report Status 03/11/2016 FINAL  Final    RADIOLOGY:  Ct Head Wo Contrast  Result Date: 04/27/2016 CLINICAL DATA:  Two falls today and yesterday. Abdominal pain, unsteady gait. History of cirrhosis, hypertension, diabetes. EXAM: CT HEAD WITHOUT CONTRAST TECHNIQUE: Contiguous axial images were obtained from the base of the skull through the vertex without intravenous contrast. COMPARISON:  CT HEAD July 23, 2014 FINDINGS: INTRACRANIAL CONTENTS: The ventricles and sulci are normal. No intraparenchymal hemorrhage, mass effect nor midline shift. No acute large vascular territory infarcts. No abnormal extra-axial fluid collections. Basal cisterns are patent. ORBITS: The included ocular globes and orbital contents are normal. SINUSES: Trace sphenoid sinus air-fluid level. Mastoid air cells are well aerated. SKULL/SOFT TISSUES: No skull fracture. No significant soft tissue swelling. IMPRESSION: Negative CT HEAD. Electronically Signed   By: Awilda Metro M.D.   On: 04/27/2016 22:31   Ct Abdomen Pelvis W Contrast  Result Date: 04/27/2016 CLINICAL DATA:  Severe abdominal pain since fall today. Also fell yesterday. EXAM: CT ABDOMEN AND PELVIS WITH CONTRAST TECHNIQUE: Multidetector CT imaging of the abdomen and pelvis was performed using the standard protocol following bolus administration of intravenous contrast. CONTRAST:  ISOVUE-300 IOPAMIDOL (ISOVUE-300) INJECTION 61% COMPARISON:  CT 1 week prior 04/20/2016 FINDINGS: Lower  chest: Scattered atelectasis. No pleural effusion. No fracture of the included ribs. Liver: Nodular contour consistent with cirrhosis. No evidence of acute traumatic injury. No focal lesion. There is recannulization of the umbilical vein. Hepatobiliary: Gallbladder decompressed.  No biliary dilatation. Pancreas: No evidence of traumatic injury. Spleen: Enlarged measuring 18.3 x 11.2 x 18.8 cm (volume = 2003.7 cc). No evidence of splenic injury. Trace perisplenic ascites is unchanged. Multiple left upper quadrant collaterals and varices. Adrenal glands: No hemorrhage. Kidneys: Symmetric renal enhancement and excretion. No hydronephrosis. No hydronephrosis. No traumatic injury. Stomach/Bowel: Stomach physiologically distended with ingested contents. There are no dilated or thickened small bowel loops. Small volume of stool throughout the colon without colonic wall thickening. The appendix is normal. No findings to suggest bowel injury. No mesenteric hematoma. Vascular/Lymphatic: No retroperitoneal fluid. The IVC and abdominal aorta are intact. Multiple collateral vessels in the abdomen. Splenorenal shunt is noted. There is mesenteric haziness and multiple prominent retroperitoneal and central mesenteric lymph nodes, unchanged from prior exam. Reproductive: No acute abnormality. Bladder: Minimally distended without acute traumatic injury. Other: Trace intra-abdominal ascites. No free air. No abdominal wall contusion. Minimal fat within both inguinal canals. Musculoskeletal: There are no acute or suspicious osseous abnormalities. Chronic minimal compression deformity versus prominent Schmorl's node superior endplate of T11. No fracture of the lumbar spine or bony pelvis. IMPRESSION: 1. No acute abnormality or acute traumatic injury in the abdomen or pelvis. 2. Cirrhosis with portal hypertension and splenomegaly. Multiple portosystemic collaterals and splenorenal shunting. Trace ascites. 3. Unchanged mesenteric edema  with multiple small mesenteric and retroperitoneal lymph nodes. This may be secondary to cirrhosis and third-spacing. Electronically Signed   By: Rubye Oaks M.D.   On: 04/27/2016 22:34    EKG:   Orders placed or performed during the hospital encounter of 04/27/16  . EKG 12-Lead  . EKG 12-Lead      Management plans discussed with the patient, family and they are in agreement.  CODE STATUS:     Code Status Orders        Start     Ordered   04/28/16 0254  Full code  Continuous     04/28/16 0253  Code Status History    Date Active Date Inactive Code Status Order ID Comments User Context   03/14/2016 12:30 PM 03/16/2016  3:41 PM Full Code [id number]  Shaune Pollack, MD Inpatient   03/03/2016 12:13 PM 03/06/2016  2:26 PM Full Code [id number]  Milagros Loll, MD ED   02/10/2016  5:54 PM 02/12/2016  7:34 PM Full Code [id number]  Ramonita Lab, MD Inpatient   11/11/2015  5:16 AM 11/13/2015  6:00 PM Full Code [id number]  Ihor Austin, MD Inpatient   11/07/2015 10:02 PM 11/09/2015  4:56 PM Full Code [id number]  Enid Baas, MD Inpatient   10/28/2015  8:14 PM 10/29/2015  2:26 PM Full Code [id number]  Altamese Dilling, MD Inpatient   10/16/2015 11:47 PM 10/17/2015  6:00 PM Full Code [id number]  Oralia Manis, MD Inpatient   07/30/2015  5:35 PM 08/04/2015  8:34 PM Full Code [id number]  Enedina Finner, MD Inpatient   07/05/2015  1:16 AM 07/08/2015  7:22 PM Full Code [id number]  Oralia Manis, MD Inpatient   05/30/2015  7:28 PM 06/01/2015  3:36 PM Full Code [id number]  Audery Amel, MD Inpatient      TOTAL TIME TAKING CARE OF THIS PATIENT: 40 minutes.    Katharina Caper M.D on 04/29/2016 at 2:02 PM  Between 7am to 6pm - Pager - [phone]  After 6pm go to [email or web address] - password [email or web address] [facility]  [facility]  Office  [phone]  CC: Primary care physician; Marisue Ivan, MD

## 2016-04-29 NOTE — Progress Notes (Signed)
Patient's IV removed, discharge instructions reviewed with patient. Patient and mother verbalized understanding. Mother transporting patient home.

## 2016-05-11 ENCOUNTER — Emergency Department: Payer: Medicare Other

## 2016-05-11 ENCOUNTER — Inpatient Hospital Stay
Admission: EM | Admit: 2016-05-11 | Discharge: 2016-05-15 | DRG: 442 | Disposition: A | Discharge status: Home / Self Care | Payer: Medicare Other | Attending: Internal Medicine | Admitting: Internal Medicine

## 2016-05-11 DIAGNOSIS — J449 Chronic obstructive pulmonary disease, unspecified: Secondary | ICD-10-CM | POA: Diagnosis present

## 2016-05-11 DIAGNOSIS — K7030 Alcoholic cirrhosis of liver without ascites: Secondary | ICD-10-CM | POA: Diagnosis present

## 2016-05-11 DIAGNOSIS — Z811 Family history of alcohol abuse and dependence: Secondary | ICD-10-CM

## 2016-05-11 DIAGNOSIS — R140 Abdominal distension (gaseous): Secondary | ICD-10-CM | POA: Diagnosis present

## 2016-05-11 DIAGNOSIS — Y92009 Unspecified place in unspecified non-institutional (private) residence as the place of occurrence of the external cause: Secondary | ICD-10-CM

## 2016-05-11 DIAGNOSIS — E119 Type 2 diabetes mellitus without complications: Secondary | ICD-10-CM | POA: Diagnosis present

## 2016-05-11 DIAGNOSIS — R188 Other ascites: Secondary | ICD-10-CM

## 2016-05-11 DIAGNOSIS — E669 Obesity, unspecified: Secondary | ICD-10-CM | POA: Diagnosis present

## 2016-05-11 DIAGNOSIS — D6959 Other secondary thrombocytopenia: Secondary | ICD-10-CM | POA: Diagnosis present

## 2016-05-11 DIAGNOSIS — Z79899 Other long term (current) drug therapy: Secondary | ICD-10-CM

## 2016-05-11 DIAGNOSIS — K7682 Hepatic encephalopathy: Secondary | ICD-10-CM

## 2016-05-11 DIAGNOSIS — Z6841 Body Mass Index (BMI) 40.0 and over, adult: Secondary | ICD-10-CM

## 2016-05-11 DIAGNOSIS — K7290 Hepatic failure, unspecified without coma: Principal | ICD-10-CM | POA: Diagnosis present

## 2016-05-11 DIAGNOSIS — W19XXXA Unspecified fall, initial encounter: Secondary | ICD-10-CM | POA: Diagnosis present

## 2016-05-11 DIAGNOSIS — Z7982 Long term (current) use of aspirin: Secondary | ICD-10-CM

## 2016-05-11 DIAGNOSIS — R42 Dizziness and giddiness: Secondary | ICD-10-CM

## 2016-05-11 DIAGNOSIS — Z9981 Dependence on supplemental oxygen: Secondary | ICD-10-CM

## 2016-05-11 DIAGNOSIS — Z823 Family history of stroke: Secondary | ICD-10-CM

## 2016-05-11 DIAGNOSIS — I10 Essential (primary) hypertension: Secondary | ICD-10-CM | POA: Diagnosis present

## 2016-05-11 DIAGNOSIS — Z9889 Other specified postprocedural states: Secondary | ICD-10-CM

## 2016-05-11 DIAGNOSIS — Z8249 Family history of ischemic heart disease and other diseases of the circulatory system: Secondary | ICD-10-CM

## 2016-05-11 DIAGNOSIS — R296 Repeated falls: Secondary | ICD-10-CM | POA: Diagnosis present

## 2016-05-11 DIAGNOSIS — M79601 Pain in right arm: Secondary | ICD-10-CM

## 2016-05-11 LAB — ETHANOL

## 2016-05-11 LAB — BASIC METABOLIC PANEL
ANION GAP: 5 (ref 5–15)
BUN: 10 mg/dL (ref 6–20)
CALCIUM: 8.4 mg/dL — AB (ref 8.9–10.3)
CO2: 25 mmol/L (ref 22–32)
Chloride: 106 mmol/L (ref 101–111)
Creatinine, Ser: 0.71 mg/dL (ref 0.61–1.24)
Glucose, Bld: 287 mg/dL — ABNORMAL HIGH (ref 65–99)
POTASSIUM: 3.8 mmol/L (ref 3.5–5.1)
SODIUM: 136 mmol/L (ref 135–145)

## 2016-05-11 LAB — URINALYSIS COMPLETE WITH MICROSCOPIC (ARMC ONLY)
BACTERIA UA: NONE SEEN
BILIRUBIN URINE: NEGATIVE
Ketones, ur: NEGATIVE mg/dL
LEUKOCYTES UA: NEGATIVE
NITRITE: NEGATIVE
PH: 5 (ref 5.0–8.0)
Protein, ur: 30 mg/dL — AB
SPECIFIC GRAVITY, URINE: 1.029 (ref 1.005–1.030)
Squamous Epithelial / LPF: NONE SEEN

## 2016-05-11 LAB — CBC
HEMATOCRIT: 28 % — AB (ref 40.0–52.0)
HEMOGLOBIN: 9.1 g/dL — AB (ref 13.0–18.0)
MCH: 26.2 pg (ref 26.0–34.0)
MCHC: 32.5 g/dL (ref 32.0–36.0)
MCV: 80.7 fL (ref 80.0–100.0)
Platelets: 126 10*3/uL — ABNORMAL LOW (ref 150–440)
RBC: 3.46 MIL/uL — AB (ref 4.40–5.90)
RDW: 16.5 % — ABNORMAL HIGH (ref 11.5–14.5)
WBC: 6.4 10*3/uL (ref 3.8–10.6)

## 2016-05-11 LAB — TROPONIN I

## 2016-05-11 MED ORDER — SODIUM CHLORIDE 0.9 % IV BOLUS (SEPSIS)
1000.0000 mL | Freq: Once | INTRAVENOUS | Status: AC
  Administered 2016-05-11: 1000 mL via INTRAVENOUS

## 2016-05-11 NOTE — ED Provider Notes (Signed)
[facility] Emergency Department Provider Note   ____________________________________________   First MD Initiated Contact with Patient 05/11/16 2324     (approximate)  I have reviewed the triage vital signs and the nursing notes.   HISTORY  Chief Complaint Fall; Arm Pain; Chest Pain; and Dizziness    HPI Howard [name] is a 35 y.o. male who comes into the hospital today with frequent falls and arm pain. The patient reports that he falls when he walks daily. His mother reports that this is been going on for at least 10 days and it happens daily mostly in the evening. The patient was seen by Dr. Burnadette PopLinthavong 2 days ago and is getting physical therapy coming to his house daily as well. He reports that today when he fell he did not hit his head. It seems as though his knees went outand he just fell to his right side. The patient's mother thinks it is due to his diabetes. He reports that he does not drink much water either. He has been taking medicines as prescribed. He reports that he did drink alcohol yesterday which wasn't supposed to. He drank a quart and a can of beer. He denies drinking any today. He does have some right upper quadrant pain which is common for the patient and he has right arm pain. The patient does not have any current chest pain or shortness of breath. Patient did state though that the pain from his arm was radiating into the right side of his chest. He reports his pain is 8 out of 10 in intensity in his arm and shoulder. He reports it is stabbing. The patient is here for evaluation of his symptoms. The patient wears O2 at home.   Past Medical History:  Diagnosis Date  . Alcoholic cirrhosis of liver with ascites (HCC)   . Anemia   . COPD (chronic obstructive pulmonary disease) (HCC)   . Depression   . Diabetes (HCC)   . Diabetes mellitus, type II (HCC)   . Esophageal varices (HCC)   . Heart disease   . Hyperlipemia   . Hypertension   .  Liver disease   . Multiple thyroid nodules   . Portal hypertensive gastropathy   . Schizophrenia [facility](HCC)     Patient Active Problem List   Diagnosis Date Noted  . Altered mental status 05/12/2016  . Headache 04/29/2016  . OSA (obstructive sleep apnea) 04/29/2016  . Anemia 04/29/2016  . Thrombocytopenia (HCC) 04/29/2016  . Coagulopathy (HCC) 04/29/2016  . Obesity 04/29/2016  . Generalized weakness 04/29/2016  . Acute hepatic encephalopathy (HCC) 03/14/2016  . Controlled type 2 diabetes mellitus without complication (HCC) 01/15/2016  . Normocytic anemia 12/12/2015  . Essential (primary) hypertension 12/11/2015  . Pure hypercholesterolemia 12/11/2015  . Fever 11/11/2015  . Ascites due to alcoholic cirrhosis (HCC) 11/11/2015  . Cirrhosis of liver with ascites (HCC) 11/11/2015  . Hepatic encephalopathy (HCC) 10/16/2015  . Type 2 diabetes mellitus (HCC) 10/16/2015  . Acute renal failure (ARF) (HCC) 07/30/2015  . 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 Alcoholic cirrhosis (HCC) 07/19/2015  . Hypoxia 07/04/2015  . Elevated transaminase level 07/04/2015  . DOE (dyspnea on exertion) 07/04/2015  . Alcohol abuse 05/31/2015  . Hypertension 05/29/2015  . Paranoid schizophrenia (HCC) 03/20/2015    Past Surgical History:  Procedure Laterality Date  . ESOPHAGOGASTRODUODENOSCOPY N/A 10/05/2015   Procedure: ESOPHAGOGASTRODUODENOSCOPY (EGD);  Surgeon: Elnita MaxwellMatthew Gordon Rein, MD;  Location: [facility] ENDOSCOPY;  Service: Endoscopy;  Laterality: N/A;  . ESOPHAGOGASTRODUODENOSCOPY (EGD) WITH PROPOFOL N/A 08/03/2015   Procedure: ESOPHAGOGASTRODUODENOSCOPY (EGD) WITH PROPOFOL;  Surgeon: Elnita Maxwell, MD;  Location: [facility] ENDOSCOPY;  Service: Endoscopy;  Laterality: N/A;  . ESOPHAGOGASTRODUODENOSCOPY (EGD) WITH PROPOFOL N/A 08/31/2015   Procedure: ESOPHAGOGASTRODUODENOSCOPY (EGD) WITH PROPOFOL;  Surgeon: Elnita Maxwell, MD;  Location: [facility] ENDOSCOPY;  Service: Endoscopy;   Laterality: N/A;  . ESOPHAGOGASTRODUODENOSCOPY (EGD) WITH PROPOFOL N/A 04/04/2016   Procedure: ESOPHAGOGASTRODUODENOSCOPY (EGD) WITH PROPOFOL;  Surgeon: Scot Jun, MD;  Location: [facility] ENDOSCOPY;  Service: Endoscopy;  Laterality: N/A;  . NO PAST SURGERIES      Prior to Admission medications   Medication Sig Start Date End Date Taking? Authorizing Provider  aspirin EC 81 MG tablet Take 81 mg by mouth daily. Reported on 04/06/2016   Yes Historical Provider, MD  citalopram (CELEXA) 20 MG tablet TAKE 1 TABLET BY MOUTH EVERY DAY 04/08/16  Yes Audery Amel, MD  furosemide (LASIX) 20 MG tablet Take 1 tablet (20 mg total) by mouth 2 (two) times daily. 02/12/16  Yes Enid Baas, MD  lactulose (CHRONULAC) 10 GM/15ML solution Take 30 mLs (20 g total) by mouth 2 (two) times daily. 04/29/16  Yes Katharina Caper, MD  losartan (COZAAR) 100 MG tablet Take 100 mg by mouth daily.   Yes Historical Provider, MD  metFORMIN (GLUCOPHAGE) 500 MG tablet Take 1 tablet (500 mg total) by mouth 2 (two) times daily with a meal. 11/13/15  Yes Katha Hamming, MD  metoprolol tartrate (LOPRESSOR) 25 MG tablet Take 25 mg by mouth 2 (two) times daily.   Yes Historical Provider, MD  Multiple Vitamin (MULTIVITAMIN WITH MINERALS) TABS tablet Take 1 tablet by mouth daily. 08/04/15  Yes Ramonita Lab, MD  nadolol (CORGARD) 40 MG tablet Take 1 tablet (40 mg total) by mouth daily. 02/12/16  Yes Enid Baas, MD  OLANZapine (ZYPREXA) 20 MG tablet Take 1 tablet (20 mg total) by mouth at bedtime. 03/13/16  Yes Audery Amel, MD  omeprazole (PRILOSEC) 40 MG capsule Take 1 capsule (40 mg total) by mouth every morning. 02/12/16  Yes Enid Baas, MD  rifaximin (XIFAXAN) 550 MG TABS tablet Take 1 tablet (550 mg total) by mouth 2 (two) times daily. 04/29/16  Yes Katharina Caper, MD  spironolactone (ALDACTONE) 25 MG tablet Take 1 tablet (25 mg total) by mouth daily. 02/12/16  Yes Enid Baas, MD    Allergies Review of  patient's allergies indicates no known allergies.  Family History  Problem Relation Age of Onset  . Heart disease Mother   . Hypertension Mother   . Hyperlipidemia Mother   . Stroke Father   . Heart attack Father   . Hypertension Father   . Heart disease Father   . Alcohol abuse Father   . Heart disease Brother     Social History Social History  Substance Use Topics  . Smoking status: Never Smoker  . Smokeless tobacco: Never Used  . Alcohol use 48.0 oz/week    80 Cans of beer per week     Comment: last drink 05/10/16    Review of Systems Constitutional: No fever/chills Eyes: No visual changes. ENT: No sore throat. Cardiovascular: chest pain. Respiratory: Denies shortness of breath. Gastrointestinal:  abdominal pain.  No nausea, no vomiting.  No diarrhea.  No constipation. Genitourinary: Negative for dysuria. Musculoskeletal: Left arm pain Skin: Negative for rash. Neurological: Dizzy and lightheaded  10-point ROS otherwise negative.  ____________________________________________   PHYSICAL EXAM:  VITAL SIGNS: ED Triage Vitals  Enc Vitals Group     BP 05/11/16 2047 132/67     Pulse Rate 05/11/16 2047 86     Resp 05/11/16 2047 (!) 22     Temp 05/11/16 2047 98.7 F (37.1 C)     Temp Source 05/11/16 2047 Oral     SpO2 05/11/16 2047 95 %     Weight 05/11/16 2047 (!) 340 lb (154.2 kg)     Height 05/11/16 2047 6\' 3"  (1.905 m)     Head Circumference --      Peak Flow --      Pain Score 05/11/16 2048 8     Pain Loc --      Pain Edu? --      Excl. in GC? --     Constitutional: Sleeping but arousable mild distress Eyes: Conjunctivae are normal. PERRL. EOMI. Head: Atraumatic. Nose: No congestion/rhinnorhea. Mouth/Throat: Mucous membranes are moist.  Oropharynx non-erythematous. Neck: No cervical spine tenderness to palpation. Cardiovascular: Normal rate, regular rhythm. Systolic murmur.  Good peripheral circulation. Respiratory: Normal respiratory effort.  No  retractions. Lungs CTAB. Gastrointestinal: Soft with some mild right upper quadrant tenderness to palpation. No distention. Positive bowel sounds Musculoskeletal: No lower extremity tenderness nor edema.   Neurologic:  Normal speech and language.  Skin:  Skin is warm, dry and intact.  Psychiatric: Mood and affect are normal.   ____________________________________________   LABS (all labs ordered are listed, but only abnormal results are displayed)  Labs Reviewed  BASIC METABOLIC PANEL - Abnormal; Notable for the following:       Result Value   Glucose, Bld 287 (*)    Calcium 8.4 (*)    All other components within normal limits  CBC - Abnormal; Notable for the following:    RBC 3.46 (*)    Hemoglobin 9.1 (*)    HCT 28.0 (*)    RDW 16.5 (*)    Platelets 126 (*)    All other components within normal limits  URINALYSIS COMPLETEWITH MICROSCOPIC ([facility] ONLY) - Abnormal; Notable for the following:    Color, Urine YELLOW (*)    APPearance CLEAR (*)    Glucose, UA >500 (*)    Hgb urine dipstick 3+ (*)    Protein, ur 30 (*)    All other components within normal limits  AMMONIA - Abnormal; Notable for the following:    Ammonia 95 (*)    All other components within normal limits  TROPONIN I  HEPATIC FUNCTION PANEL  ETHANOL  AMMONIA  CBG MONITORING, ED   ____________________________________________  EKG  ED ECG REPORT I, Rebecka ApleyWebster,  Shaelyn Decarli P, the attending physician, personally viewed and interpreted this ECG.   Date: 05/11/2016  EKG Time: [id number]  Rate: 84  Rhythm: normal EKG, normal sinus rhythm  Axis: Normal  Intervals:none  ST&T Change: none  ____________________________________________  RADIOLOGY  CXR Right arm xray ____________________________________________   PROCEDURES  Procedure(s) performed: None  Procedures  Critical Care performed: No  ____________________________________________   INITIAL IMPRESSION / ASSESSMENT AND PLAN / ED  COURSE  Pertinent labs & imaging results that were available during my care of the patient were reviewed by me and considered in my medical decision making (see chart for details).  This is a 35 year old male who comes into the hospital today with frequent falls and some right arm pain. The patient was seen a few weeks ago and admitted to the hospital for hepatic encephalopathy after a fall. The patient has fallen multiple times and has had  many admissions for his falls. The patient was also seen by his primary care physician and is undergoing physical therapy. I will check the patient's ethanol as well as a pneumonia and his liver function panel. I will check orthostatics and give the patient some hydration with a liter of normal saline. The patient will be reassessed after I received the results of the blood work.  Clinical Course  Value Comment By Time  DG Humerus Right Negative. Rebecka Apley, MD 08/20 2322  DG Chest 2 View No active cardiopulmonary disease Rebecka Apley, MD 08/20 2323   The patient's x-ray is unremarkable. While I am attempting to examine him he is very sleepy and out of it. I did add on a pneumonia to the patient's blood work and appears as though his ammonia is 95. The patient's ammonia when he was discharged previously was 81. I feel that this may be contributing to the patient's weakness as well as his somnolent as I am trying to evaluate him. I will give the patient a dose of lactulose and I will admit him to the hospitalist service for weakness and hepatic encephalopathy. I contacted the hospitalist who admitted the patient and accepted him to the floor. The patient will be admitted.  ____________________________________________   FINAL CLINICAL IMPRESSION(S) / ED DIAGNOSES  Final diagnoses:  Hepatic encephalopathy (HCC)  Dizziness  Fall, initial encounter  Right arm pain      NEW MEDICATIONS STARTED DURING THIS VISIT:  Current Discharge Medication  List       Note:  This document was prepared using Dragon voice recognition software and may include unintentional dictation errors.    Rebecka Apley, MD 05/12/16 [phone]

## 2016-05-11 NOTE — ED Triage Notes (Addendum)
Pt to triage via wheelchair with home O2 in place at 2 l/min via [place]. Marland Kitchen. Pt reports around 5 pm today he fell at home landing on his right side and right arm. Pt reports he fell because he became dizzy and weak. Pt co pain to his upper right arm to his right front  chest area. Pt states he still feels "real light headed". Pt has hx of alcohol abuse and reports he has not drank today but did drink "a quart and a can of beer" yesterday.

## 2016-05-12 ENCOUNTER — Inpatient Hospital Stay: Payer: Medicare Other

## 2016-05-12 DIAGNOSIS — Z8249 Family history of ischemic heart disease and other diseases of the circulatory system: Secondary | ICD-10-CM | POA: Diagnosis not present

## 2016-05-12 DIAGNOSIS — Z6841 Body Mass Index (BMI) 40.0 and over, adult: Secondary | ICD-10-CM | POA: Diagnosis not present

## 2016-05-12 DIAGNOSIS — Z9889 Other specified postprocedural states: Secondary | ICD-10-CM | POA: Diagnosis not present

## 2016-05-12 DIAGNOSIS — R140 Abdominal distension (gaseous): Secondary | ICD-10-CM | POA: Diagnosis present

## 2016-05-12 DIAGNOSIS — Z79899 Other long term (current) drug therapy: Secondary | ICD-10-CM | POA: Diagnosis not present

## 2016-05-12 DIAGNOSIS — K7290 Hepatic failure, unspecified without coma: Secondary | ICD-10-CM | POA: Diagnosis present

## 2016-05-12 DIAGNOSIS — Z9981 Dependence on supplemental oxygen: Secondary | ICD-10-CM | POA: Diagnosis not present

## 2016-05-12 DIAGNOSIS — W19XXXA Unspecified fall, initial encounter: Secondary | ICD-10-CM | POA: Diagnosis present

## 2016-05-12 DIAGNOSIS — Z811 Family history of alcohol abuse and dependence: Secondary | ICD-10-CM | POA: Diagnosis not present

## 2016-05-12 DIAGNOSIS — I10 Essential (primary) hypertension: Secondary | ICD-10-CM | POA: Diagnosis present

## 2016-05-12 DIAGNOSIS — D6959 Other secondary thrombocytopenia: Secondary | ICD-10-CM | POA: Diagnosis present

## 2016-05-12 DIAGNOSIS — E669 Obesity, unspecified: Secondary | ICD-10-CM | POA: Diagnosis present

## 2016-05-12 DIAGNOSIS — J449 Chronic obstructive pulmonary disease, unspecified: Secondary | ICD-10-CM | POA: Diagnosis present

## 2016-05-12 DIAGNOSIS — K7030 Alcoholic cirrhosis of liver without ascites: Secondary | ICD-10-CM | POA: Diagnosis present

## 2016-05-12 DIAGNOSIS — E119 Type 2 diabetes mellitus without complications: Secondary | ICD-10-CM | POA: Diagnosis present

## 2016-05-12 DIAGNOSIS — Z823 Family history of stroke: Secondary | ICD-10-CM | POA: Diagnosis not present

## 2016-05-12 DIAGNOSIS — Z7982 Long term (current) use of aspirin: Secondary | ICD-10-CM | POA: Diagnosis not present

## 2016-05-12 DIAGNOSIS — Y92009 Unspecified place in unspecified non-institutional (private) residence as the place of occurrence of the external cause: Secondary | ICD-10-CM | POA: Diagnosis not present

## 2016-05-12 DIAGNOSIS — R296 Repeated falls: Secondary | ICD-10-CM | POA: Diagnosis present

## 2016-05-12 LAB — AMMONIA
AMMONIA: 81 umol/L — AB (ref 9–35)
Ammonia: 95 umol/L — ABNORMAL HIGH (ref 9–35)

## 2016-05-12 LAB — PROTIME-INR
INR: 1.2
Prothrombin Time: 15.3 seconds — ABNORMAL HIGH (ref 11.4–15.2)

## 2016-05-12 LAB — HEPATIC FUNCTION PANEL
ALBUMIN: 3.5 g/dL (ref 3.5–5.0)
ALT: 30 U/L (ref 17–63)
AST: 35 U/L (ref 15–41)
Alkaline Phosphatase: 109 U/L (ref 38–126)
Bilirubin, Direct: 0.1 mg/dL (ref 0.1–0.5)
Indirect Bilirubin: 0.6 mg/dL (ref 0.3–0.9)
TOTAL PROTEIN: 7.4 g/dL (ref 6.5–8.1)
Total Bilirubin: 0.7 mg/dL (ref 0.3–1.2)

## 2016-05-12 LAB — GLUCOSE, CAPILLARY
GLUCOSE-CAPILLARY: 108 mg/dL — AB (ref 65–99)
Glucose-Capillary: 119 mg/dL — ABNORMAL HIGH (ref 65–99)

## 2016-05-12 MED ORDER — LACTULOSE 10 GM/15ML PO SOLN
20.0000 g | Freq: Once | ORAL | Status: AC
  Administered 2016-05-12: 20 g via ORAL
  Filled 2016-05-12: qty 30

## 2016-05-12 MED ORDER — ADULT MULTIVITAMIN W/MINERALS CH
1.0000 | ORAL_TABLET | Freq: Every day | ORAL | Status: DC
  Administered 2016-05-12 – 2016-05-15 (×4): 1 via ORAL
  Filled 2016-05-12 (×4): qty 1

## 2016-05-12 MED ORDER — SPIRONOLACTONE 25 MG PO TABS
25.0000 mg | ORAL_TABLET | Freq: Every day | ORAL | Status: DC
  Administered 2016-05-12 – 2016-05-15 (×4): 25 mg via ORAL
  Filled 2016-05-12 (×4): qty 1

## 2016-05-12 MED ORDER — SODIUM CHLORIDE 0.9% FLUSH: 3.0000 mL | INTRAVENOUS | Status: DC | PRN

## 2016-05-12 MED ORDER — FUROSEMIDE 20 MG PO TABS
20.0000 mg | ORAL_TABLET | Freq: Two times a day (BID) | ORAL | Status: DC
  Administered 2016-05-12 – 2016-05-15 (×7): 20 mg via ORAL
  Filled 2016-05-12 (×7): qty 1

## 2016-05-12 MED ORDER — INSULIN ASPART 100 UNIT/ML ~~LOC~~ SOLN
0.0000 [IU] | Freq: Three times a day (TID) | SUBCUTANEOUS | Status: DC
  Administered 2016-05-13: 12:00:00 1 [IU] via SUBCUTANEOUS
  Filled 2016-05-12: qty 1

## 2016-05-12 MED ORDER — MAGNESIUM SULFATE 2 GM/50ML IV SOLN
2.0000 g | Freq: Once | INTRAVENOUS | Status: AC
  Administered 2016-05-12: 2 g via INTRAVENOUS
  Filled 2016-05-12: qty 50

## 2016-05-12 MED ORDER — INSULIN ASPART 100 UNIT/ML ~~LOC~~ SOLN: 0.0000 [IU] | Freq: Every day | SUBCUTANEOUS | Status: DC

## 2016-05-12 MED ORDER — ONDANSETRON HCL 4 MG/2ML IJ SOLN: 4.0000 mg | Freq: Four times a day (QID) | INTRAMUSCULAR | Status: DC | PRN

## 2016-05-12 MED ORDER — SODIUM CHLORIDE 0.9% FLUSH
3.0000 mL | Freq: Two times a day (BID) | INTRAVENOUS | Status: DC
  Administered 2016-05-12 – 2016-05-15 (×7): 3 mL via INTRAVENOUS

## 2016-05-12 MED ORDER — CITALOPRAM HYDROBROMIDE 20 MG PO TABS
20.0000 mg | ORAL_TABLET | Freq: Every day | ORAL | Status: DC
Start: 2016-05-12 — End: 2016-05-15
  Administered 2016-05-12 – 2016-05-15 (×4): 20 mg via ORAL
  Filled 2016-05-12 (×4): qty 1

## 2016-05-12 MED ORDER — METOPROLOL TARTRATE 25 MG PO TABS
25.0000 mg | ORAL_TABLET | Freq: Two times a day (BID) | ORAL | Status: DC
  Administered 2016-05-12 – 2016-05-15 (×6): 25 mg via ORAL
  Filled 2016-05-12 (×7): qty 1

## 2016-05-12 MED ORDER — SODIUM CHLORIDE 0.9 % IV SOLN: 250.0000 mL | INTRAVENOUS | Status: DC | PRN

## 2016-05-12 MED ORDER — ONDANSETRON HCL 4 MG PO TABS: 4.0000 mg | ORAL_TABLET | Freq: Four times a day (QID) | ORAL | Status: DC | PRN

## 2016-05-12 MED ORDER — KETOROLAC TROMETHAMINE 30 MG/ML IJ SOLN
30.0000 mg | Freq: Once | INTRAMUSCULAR | Status: AC
  Administered 2016-05-12: 30 mg via INTRAVENOUS
  Filled 2016-05-12: qty 1

## 2016-05-12 MED ORDER — OLANZAPINE 10 MG PO TABS
20.0000 mg | ORAL_TABLET | Freq: Every day | ORAL | Status: DC
  Administered 2016-05-12 – 2016-05-14 (×3): 20 mg via ORAL
  Filled 2016-05-12 (×3): qty 2

## 2016-05-12 MED ORDER — PANTOPRAZOLE SODIUM 40 MG PO TBEC
40.0000 mg | DELAYED_RELEASE_TABLET | Freq: Every day | ORAL | Status: DC
  Administered 2016-05-12 – 2016-05-15 (×4): 40 mg via ORAL
  Filled 2016-05-12 (×4): qty 1

## 2016-05-12 MED ORDER — MORPHINE SULFATE (PF) 2 MG/ML IV SOLN
2.0000 mg | Freq: Four times a day (QID) | INTRAVENOUS | Status: DC | PRN
  Administered 2016-05-12 – 2016-05-13 (×3): 2 mg via INTRAVENOUS
  Filled 2016-05-12 (×5): qty 1

## 2016-05-12 MED ORDER — ASPIRIN EC 81 MG PO TBEC
81.0000 mg | DELAYED_RELEASE_TABLET | Freq: Every day | ORAL | Status: DC
  Administered 2016-05-12: 12:00:00 81 mg via ORAL
  Filled 2016-05-12: qty 1

## 2016-05-12 MED ORDER — LACTULOSE 10 GM/15ML PO SOLN
30.0000 g | Freq: Four times a day (QID) | ORAL | Status: DC
  Administered 2016-05-12 – 2016-05-14 (×9): 30 g via ORAL
  Filled 2016-05-12 (×2): qty 60
  Filled 2016-05-12: qty 30
  Filled 2016-05-12 (×7): qty 60

## 2016-05-12 MED ORDER — METFORMIN HCL 500 MG PO TABS
500.0000 mg | ORAL_TABLET | Freq: Two times a day (BID) | ORAL | Status: DC
  Administered 2016-05-12 – 2016-05-15 (×7): 500 mg via ORAL
  Filled 2016-05-12 (×7): qty 1

## 2016-05-12 MED ORDER — ENOXAPARIN SODIUM 40 MG/0.4ML ~~LOC~~ SOLN
40.0000 mg | Freq: Two times a day (BID) | SUBCUTANEOUS | Status: DC
  Administered 2016-05-12 – 2016-05-15 (×7): 40 mg via SUBCUTANEOUS
  Filled 2016-05-12 (×7): qty 0.4

## 2016-05-12 MED ORDER — LOSARTAN POTASSIUM 50 MG PO TABS
100.0000 mg | ORAL_TABLET | Freq: Every day | ORAL | Status: DC
  Administered 2016-05-12 – 2016-05-15 (×4): 100 mg via ORAL
  Filled 2016-05-12 (×4): qty 2

## 2016-05-12 MED ORDER — RIFAXIMIN 550 MG PO TABS
550.0000 mg | ORAL_TABLET | Freq: Two times a day (BID) | ORAL | Status: DC
  Administered 2016-05-12 – 2016-05-15 (×7): 550 mg via ORAL
  Filled 2016-05-12 (×7): qty 1

## 2016-05-12 MED ORDER — NADOLOL 40 MG PO TABS
40.0000 mg | ORAL_TABLET | Freq: Every day | ORAL | Status: DC
  Administered 2016-05-12 – 2016-05-15 (×4): 40 mg via ORAL
  Filled 2016-05-12 (×4): qty 1

## 2016-05-12 NOTE — Evaluation (Signed)
Physical Therapy Evaluation Patient Details Name: Howard LinemanJoey A [name] MRN: [id number] DOB: 28-Feb-1981 Today's Date: 05/12/2016   History of Present Illness  Pt is a 35 y.o. male admitted s/p fall onto R side/arm (knees gave out).  Pt with recent admit d/t hepatic encephalopathy after a fall.  Pt now admitted with AMS secondary to hepatic encepahlopathy.  PMH includes htn, EtOH abuse, COPD, DM, CAD, HLD, schizophrenia, alcoholic/liver cirrhosis.  Clinical Impression  Prior to admission, pt was modified independent ambulating with RW (pt has h/o needing RW d/t knees buckling).  Pt lives with his mom and brother in 1 level home with 5 STE B railings.  Currently pt is CGA for supine to sit, sit to/from stand, and ambulating 20 feet with RW (limited distance d/t pt fatigue).  Pt would benefit from skilled PT to address noted impairments and functional limitations.  Recommend pt discharge to home with supervision for functional mobility, use of RW, and HHPT when medically appropriate.     Follow Up Recommendations Home health PT;Supervision for mobility/OOB    Equipment Recommendations   (pt already owns walker)    Recommendations for Other Services       Precautions / Restrictions Precautions Precautions: Fall Restrictions Weight Bearing Restrictions: No      Mobility  Bed Mobility Overal bed mobility: Needs Assistance Bed Mobility: Supine to Sit     Supine to sit: Min guard;HOB elevated     General bed mobility comments: mildly increased time to perform but no physical assist required  Transfers Overall transfer level: Needs assistance Equipment used: Rolling walker (2 wheeled) Transfers: Sit to/from Stand Sit to Stand: Min guard         General transfer comment: pt had strong stand but shortly after standing pt's knees buckled but pt able to catch self with use of RW  Ambulation/Gait Ambulation/Gait assistance: Min guard;+2 safety/equipment Ambulation Distance (Feet): 20  Feet Assistive device: Rolling walker (2 wheeled) Gait Pattern/deviations: Step-through pattern;Wide base of support Gait velocity: decreased   General Gait Details: limited distance d/t pt reporting fatigue/weakness and pt with knee buckling earlier in session  Stairs            Wheelchair Mobility    Modified Rankin (Stroke Patients Only)       Balance Overall balance assessment: Needs assistance Sitting-balance support: Bilateral upper extremity supported;Feet supported Sitting balance-Leahy Scale: Good     Standing balance support: Bilateral upper extremity supported (on RW) Standing balance-Leahy Scale: Fair                               Pertinent Vitals/Pain   Vitals (HR and O2) stable and WFL throughout treatment session on 3 L/min O2 via nasal cannula.     Home Living Family/patient expects to be discharged to:: Private residence Living Arrangements: Parent Available Help at Discharge: Family;Available 24 hours/day (Pt's mom and brother) Type of Home: House Home Access: Stairs to enter Entrance Stairs-Rails: Right;Left;Can reach both Entrance Stairs-Number of Steps: 5 Home Layout: One level Home Equipment: Walker - 2 wheels;Cane - single point;Bedside commode;Wheelchair - power      Prior Function Level of Independence: Independent with assistive device(s)         Comments: Pt ambulates household/limited community distances with RW and O2; reports he bought an [employer] wheel-chair but does not fit in home to use; pt's mom reports pt tends to set walker aside and not use it; h/o  frequent falls     Hand Dominance        Extremity/Trunk Assessment   Upper Extremity Assessment: Generalized weakness           Lower Extremity Assessment: Generalized weakness      Cervical / Trunk Assessment: Normal  Communication   Communication: No difficulties  Cognition Arousal/Alertness: Awake/alert Behavior During Therapy: WFL for tasks  assessed/performed Overall Cognitive Status:  (Oriented to person and place; pt's mother reports noting confusion)                      General Comments General comments (skin integrity, edema, etc.): Pt's mother present during session.  Nursing cleared pt for participation in physical therapy.  Pt agreeable to PT session.    Exercises        Assessment/Plan    PT Assessment Patient needs continued PT services  PT Diagnosis Difficulty walking;Generalized weakness   PT Problem List Decreased strength;Decreased activity tolerance;Decreased balance;Decreased mobility  PT Treatment Interventions DME instruction;Gait training;Stair training;Functional mobility training;Therapeutic activities;Therapeutic exercise;Balance training;Patient/family education   PT Goals (Current goals can be found in the Care Plan section) Acute Rehab PT Goals Patient Stated Goal: to go home PT Goal Formulation: With patient Time For Goal Achievement: 05/26/16 Potential to Achieve Goals: Good    Frequency Min 2X/week   Barriers to discharge        Co-evaluation               End of Session Equipment Utilized During Treatment: Gait belt;Oxygen (3 L/min via nasal cannula) Activity Tolerance: Patient limited by fatigue Patient left: in chair;with call bell/phone within reach;with chair alarm set;with nursing/sitter in room;with family/visitor present Nurse Communication: Mobility status;Precautions         Time: [id number] PT Time Calculation (min) (ACUTE ONLY): 24 min   Charges:   PT Evaluation $PT Eval Low Complexity: 1 Procedure PT Treatments $Therapeutic Activity: 8-22 mins   PT G CodesHendricks [name]:        Howard [name] 05/12/2016, 5:39 PM Howard LimesEmily Vihan [name], PT [phone]

## 2016-05-12 NOTE — Care Management Important Message (Signed)
Important Message  Patient Details  Name: Howard [name] MRN: [id number] Date of Birth: 01-02-1981   Medicare Important Message Given:  Yes    Gwenette GreetBrenda S Alizey Noren, RN 05/12/2016, 9:53 AM

## 2016-05-12 NOTE — Care Management (Addendum)
Admitted to [facility] with the diagnosis of altered mental status/cirrhosis. Lives with mother, Kathie RhodesBetty, [phone]). Seen Dr. Burnadette PopLinthavong in the last few months per mother. Chronic home oxygen for a long time 2 liters per nasal cannula.  Home health is provided by [facility] in the home. Cane, rolling walker, and electric wheelchair in the home. No skilled facility. [facility]. Takes care of all basic activities of daily living himself, drives. Many falls. Appetite not good in the morning, but gets better as the day goes.   Gwenette GreetBrenda S Linsey Arteaga RN MSN CCM Care Management [phone]

## 2016-05-12 NOTE — Progress Notes (Signed)
Anticoagulation monitoring(Lovenox):  35 yo Male ordered Lovenox 40 mg Q24h  Filed Weights   05/11/16 2047  Weight: (!) 340 lb (154.2 kg)   BMI 42.6   Lab Results  Component Value Date   CREATININE 0.71 05/11/2016   CREATININE 0.62 04/28/2016   CREATININE 0.79 04/27/2016   Estimated Creatinine Clearance: 201 mL/min (by C-G formula based on SCr of 0.8 mg/dL). Hemoglobin & Hematocrit     Component Value Date/Time   HGB 9.1 (L) 05/11/2016 2053   HGB 14.4 12/25/2014 1404   HCT 28.0 (L) 05/11/2016 2053   HCT 44.7 12/25/2014 1404     Per Protocol for Patient with estCrcl > 30 ml/min and BMI > 40, will transition to Lovenox 40 mg Q12h.

## 2016-05-12 NOTE — Progress Notes (Signed)
Inpatient Diabetes Program Recommendations  AACE/ADA: New Consensus Statement on Inpatient Glycemic Control (2015)  Target Ranges:  Prepandial:   less than 140 mg/dL      Peak postprandial:   less than 180 mg/dL (1-2 hours)      Critically ill patients:  140 - 180 mg/dL   Results for Howard [name], Howard [name] (MRN [id number]) as of 05/12/2016 11:32  Ref. Range 05/11/2016 20:53  Glucose Latest Ref Range: 65 - 99 mg/dL 409287 (H)    Review of Glycemic Control  Diabetes history: DM2 Outpatient Diabetes medications: Metformin 500 mg BID Current orders for Inpatient glycemic control: Metformin 500 mg BID  Inpatient Diabetes Program Recommendations: Correction (SSI): While inpatient, please consider ordering CBGs with Novolog correction scale ACHS.  Thanks, Orlando PennerMarie Aspin Palomarez, RN, MSN, CDE Diabetes Coordinator Inpatient Diabetes Program [phone] (Team Pager from 8am to 5pm) [phone] ([facility] office) [phone] [facility] office) [phone] [facility] office)

## 2016-05-12 NOTE — H&P (Signed)
[facility] [place] [facility]   PATIENT NAME: Howard BowlerJoey [name]    MR#:  [id number]  DATE OF BIRTH:  November 24, 1980  DATE OF ADMISSION:  05/11/2016  PRIMARY CARE PHYSICIAN: Marisue IvanLINTHAVONG, [name], MD   REQUESTING/REFERRING PHYSICIAN:   CHIEF COMPLAINT:   Chief Complaint  Patient presents with  . Fall  . Arm Pain  . Chest Pain  . Dizziness    HISTORY OF PRESENT ILLNESS: Howard BowlerJoey [name]  is a 35 y.o. male with a known history of Alcoholic cirrhosis of liver, COPD, type 2 diabetes mellitus, esophageal varices, hypertension presented to the emergency room after he had a fall. Patient was brought to the emergency room on oxygen via nasal cannula 2 L. Uses oxygen at home. Patient said he felt dizzy and weak and fell in front of the refrigerator at home. [facility] and a can of beer. Patient is lethargic and sleepy today and arousable to verbal commands. And in between conversation he again drifts back to sleep. He was worked up in the emergency room and his ammonia level was elevated. Patient said he was taking lactulose at home. No complaints of any chest pain. No complaints of any orthopnea or proximal nocturnal dyspnea. Patient had right arm pain after he had a fall. He was worked up with x-ray of the arm which showed no fracture. Patient was given oral lactulose in the emergency room hospitalist service was consulted for further care of the patient. No complaints of any vomiting of blood, and rectal bleed.  PAST MEDICAL HISTORY:   Past Medical History:  Diagnosis Date  . Alcoholic cirrhosis of liver with ascites (HCC)   . Anemia   . COPD (chronic obstructive pulmonary disease) (HCC)   . Depression   . Diabetes (HCC)   . Diabetes mellitus, type II (HCC)   . Esophageal varices (HCC)   . Heart disease   . Hyperlipemia   . Hypertension   . Liver disease   . Multiple thyroid nodules   . Portal hypertensive gastropathy   . Schizophrenia (HCC)      PAST SURGICAL HISTORY: Past Surgical History:  Procedure Laterality Date  . ESOPHAGOGASTRODUODENOSCOPY N/A 10/05/2015   Procedure: ESOPHAGOGASTRODUODENOSCOPY (EGD);  Surgeon: Elnita MaxwellMatthew Gordon Rein, MD;  [facility];  Service: Endoscopy;  Laterality: N/A;  . ESOPHAGOGASTRODUODENOSCOPY (EGD) WITH PROPOFOL N/A 08/03/2015   Procedure: ESOPHAGOGASTRODUODENOSCOPY (EGD) WITH PROPOFOL;  Surgeon: Elnita MaxwellMatthew Gordon Rein, MD;  [facility];  Service: Endoscopy;  Laterality: N/A;  . ESOPHAGOGASTRODUODENOSCOPY (EGD) WITH PROPOFOL N/A 08/31/2015   Procedure: ESOPHAGOGASTRODUODENOSCOPY (EGD) WITH PROPOFOL;  Surgeon: Elnita MaxwellMatthew Gordon Rein, MD;  [facility];  Service: Endoscopy;  Laterality: N/A;  . ESOPHAGOGASTRODUODENOSCOPY (EGD) WITH PROPOFOL N/A 04/04/2016   Procedure: ESOPHAGOGASTRODUODENOSCOPY (EGD) WITH PROPOFOL;  Surgeon: Scot Junobert T Elliott, MD;  [facility];  Service: Endoscopy;  Laterality: N/A;  . NO PAST SURGERIES      SOCIAL HISTORY:  Social History  Substance Use Topics  . Smoking status: Never Smoker  . Smokeless tobacco: Never Used  . Alcohol use 48.0 oz/week    80 Cans of beer per week     Comment: last drink 04/19/16    FAMILY HISTORY:  Family History  Problem Relation Age of Onset  . Heart disease Mother   . Hypertension Mother   . Hyperlipidemia Mother   . Stroke Father   . Heart attack Father   . Hypertension Father   . Heart disease Father   .  Alcohol abuse Father   . Heart disease Brother     DRUG ALLERGIES: No Known Allergies  REVIEW OF SYSTEMS:   CONSTITUTIONAL: No fever, has weakness.  EYES: No blurred or double vision.  EARS, NOSE, AND THROAT: No tinnitus or ear pain.  RESPIRATORY: No cough, shortness of breath, wheezing or hemoptysis.  CARDIOVASCULAR: No chest pain, orthopnea, edema.  GASTROINTESTINAL: No nausea, vomiting, diarrhea or abdominal pain.  GENITOURINARY: No dysuria, hematuria.  ENDOCRINE: No polyuria,  nocturia,  HEMATOLOGY: No anemia, easy bruising or bleeding SKIN: No rash or lesion. MUSCULOSKELETAL: No joint pain or arthritis.  Has right arm pain NEUROLOGIC: No tingling, numbness, weakness.  PSYCHIATRY: No anxiety or depression.   MEDICATIONS AT HOME:  Prior to Admission medications   Medication Sig Start Date End Date Taking? Authorizing Provider  aspirin EC 81 MG tablet Take 81 mg by mouth daily. Reported on 04/06/2016    Historical Provider, MD  citalopram (CELEXA) 20 MG tablet TAKE 1 TABLET BY MOUTH EVERY DAY 04/08/16   Audery AmelJohn T [name], MD  furosemide (LASIX) 20 MG tablet Take 1 tablet (20 mg total) by mouth 2 (two) times daily. 02/12/16   Enid Baasadhika Kalisetti, MD  lactulose (CHRONULAC) 10 GM/15ML solution Take 30 mLs (20 g total) by mouth 2 (two) times daily. 04/29/16   Katharina Caperima Vaickute, MD  losartan (COZAAR) 100 MG tablet Take 100 mg by mouth daily.    Historical Provider, MD  metFORMIN (GLUCOPHAGE) 500 MG tablet Take 1 tablet (500 mg total) by mouth 2 (two) times daily with a meal. 11/13/15   Katha HammingSnehalatha Konidena, MD  metoprolol tartrate (LOPRESSOR) 25 MG tablet Take 25 mg by mouth 2 (two) times daily.    Historical Provider, MD  Multiple Vitamin (MULTIVITAMIN WITH MINERALS) TABS tablet Take 1 tablet by mouth daily. 08/04/15   Ramonita LabAruna Gouru, MD  nadolol (CORGARD) 40 MG tablet Take 1 tablet (40 mg total) by mouth daily. 02/12/16   Enid Baasadhika Kalisetti, MD  OLANZapine (ZYPREXA) 20 MG tablet Take 1 tablet (20 mg total) by mouth at bedtime. 03/13/16   Audery AmelJohn T [name], MD  omeprazole (PRILOSEC) 40 MG capsule Take 1 capsule (40 mg total) by mouth every morning. 02/12/16   Enid Baasadhika Kalisetti, MD  rifaximin (XIFAXAN) 550 MG TABS tablet Take 1 tablet (550 mg total) by mouth 2 (two) times daily. 04/29/16   Katharina Caperima Vaickute, MD  spironolactone (ALDACTONE) 25 MG tablet Take 1 tablet (25 mg total) by mouth daily. 02/12/16   Enid Baasadhika Kalisetti, MD      PHYSICAL EXAMINATION:   VITAL SIGNS: Blood pressure (!) 150/89, pulse  71, temperature 98.7 F (37.1 C), temperature source Oral, resp. rate 20, height 6\' 3"  (1.905 m), weight (!) 154.2 kg (340 lb), SpO2 99 %.  GENERAL:  35 y.o.-year-old patient lying in the bed lethargic and sleepy. Comfortable on oxygen via nasal canula. EYES: Pupils equal, round, reactive to light and accommodation. No scleral icterus. Extraocular muscles intact.  HEENT: Head atraumatic, normocephalic. Oropharynx and nasopharynx clear.  NECK:  Supple, no jugular venous distention. No thyroid enlargement, no tenderness.  LUNGS: Normal breath sounds bilaterally, no wheezing, rales,rhonchi or crepitation. No use of accessory muscles of respiration.  CARDIOVASCULAR: S1, S2 normal. No murmurs, rubs, or gallops.  ABDOMEN: Soft, nontender, non distended, obese. Bowel sounds present. No organomegaly or mass.  EXTREMITIES: No pedal edema, cyanosis, or clubbing.  NEUROLOGIC: Cranial nerves II through XII are intact. Muscle strength 5/5 in all extremities. Sensation intact. Gait not checked.  PSYCHIATRIC: oriented to  time,place and person SKIN: No obvious rash, lesion, or ulcer.   LABORATORY PANEL:   CBC  Recent Labs Lab 05/11/16 2053  WBC 6.4  HGB 9.1*  HCT 28.0*  PLT 126*  MCV 80.7  MCH 26.2  MCHC 32.5  RDW 16.5*   ------------------------------------------------------------------------------------------------------------------  Chemistries   Recent Labs Lab 05/11/16 2053  NA 136  K 3.8  CL 106  CO2 25  GLUCOSE 287*  BUN 10  CREATININE 0.71  CALCIUM 8.4*  AST 35  ALT 30  ALKPHOS 109  BILITOT 0.7   ------------------------------------------------------------------------------------------------------------------ estimated creatinine clearance is 201 mL/min (by C-G formula based on SCr of 0.8 mg/dL). ------------------------------------------------------------------------------------------------------------------ No results for input(s): TSH, T4TOTAL, T3FREE, THYROIDAB in  the last 72 hours.  Invalid input(s): FREET3   Coagulation profile No results for input(s): INR, PROTIME in the last 168 hours. ------------------------------------------------------------------------------------------------------------------- No results for input(s): DDIMER in the last 72 hours. -------------------------------------------------------------------------------------------------------------------  Cardiac Enzymes  Recent Labs Lab 05/11/16 2053  TROPONINI <0.03   ------------------------------------------------------------------------------------------------------------------ Invalid input(s): POCBNP  ---------------------------------------------------------------------------------------------------------------  Urinalysis    Component Value Date/Time   COLORURINE YELLOW (A) 05/11/2016 2053   APPEARANCEUR CLEAR (A) 05/11/2016 2053   APPEARANCEUR Clear 12/25/2014 1404   LABSPEC 1.029 05/11/2016 2053   LABSPEC 1.016 12/25/2014 1404   PHURINE 5.0 05/11/2016 2053   GLUCOSEU >500 (A) 05/11/2016 2053   GLUCOSEU Negative 12/25/2014 1404   HGBUR 3+ (A) 05/11/2016 2053   BILIRUBINUR NEGATIVE 05/11/2016 2053   BILIRUBINUR Negative 12/25/2014 1404   KETONESUR NEGATIVE 05/11/2016 2053   PROTEINUR 30 (A) 05/11/2016 2053   NITRITE NEGATIVE 05/11/2016 2053   LEUKOCYTESUR NEGATIVE 05/11/2016 2053   LEUKOCYTESUR Negative 12/25/2014 1404     RADIOLOGY: Dg Chest 2 View  Result Date: 05/11/2016 CLINICAL DATA:  Pain after fall. EXAM: CHEST  2 VIEW COMPARISON:  April 13, 2016 FINDINGS: Minimal opacity in left lung base is similar compared to previous studies, likely scar or atelectasis. The cardiomediastinal silhouette is stable. No pneumothorax. No other acute abnormalities. Linear opacity over the heart on the lateral view suggests atelectasis in the lingula or right middle lobe. IMPRESSION: No active cardiopulmonary disease. Electronically Signed   By: Gerome Sam III M.D    On: 05/11/2016 22:14   Dg Humerus Right  Result Date: 05/11/2016 CLINICAL DATA:  Pain after fall. EXAM: RIGHT HUMERUS - 2+ VIEW COMPARISON:  None. FINDINGS: There is no evidence of fracture or other focal bone lesions. Soft tissues are unremarkable. IMPRESSION: Negative. Electronically Signed   By: Gerome Sam III M.D   On: 05/11/2016 22:16    EKG: Orders placed or performed during the hospital encounter of 05/11/16  . ED EKG  . ED EKG    IMPRESSION AND PLAN: 35 year old male patient with history of alcoholic cirrhosis of liver, hypertension, hyperlipidemia, esophageal varices, type 2 diabetes mellitus presented to the emergency room with lethargy and sleepiness and a fall. Admitting diagnosis 1. Accidental fall 2. Hepatic encephalopathy 3. Altered mental status secondary to hepatic encephalopathy 4. 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 Hypertension 6. Type 2 diabetes mellitus Treatment plan Admit patient to medical floor inpatient service Start oral lactulose 30 g every 6 hourly Follow-up ammonia level Resume Xifaxan and oral nadolol DVT prophylaxis with subcutaneous Lovenox 40 MG daily Monitor platelet count Supportive care.  All the records are reviewed and case discussed with ED provider. Management plans discussed with the patient, family and they are in agreement.  CODE STATUS:FULL Code Status History  Date Active Date Inactive Code Status Order ID Comments User Context   04/28/2016  2:54 AM 04/29/2016  6:23 PM Full Code [id number]  [employer], DO Inpatient   03/14/2016 12:30 PM 03/16/2016  3:41 PM Full Code [id number]  Shaune Pollack, MD Inpatient   03/03/2016 12:13 PM 03/06/2016  2:26 PM Full Code [id number]  Milagros Loll, MD ED   02/10/2016  5:54 PM 02/12/2016  7:34 PM Full Code [id number]  Ramonita Lab, MD Inpatient   11/11/2015  5:16 AM 11/13/2015  6:00 PM Full Code [id number]  Ihor Austin, MD Inpatient   11/07/2015 10:02 PM 11/09/2015  4:56 PM Full Code [id number]   Enid Baas, MD Inpatient   10/28/2015  8:14 PM 10/29/2015  2:26 PM Full Code [id number]  Altamese Dilling, MD Inpatient   10/16/2015 11:47 PM 10/17/2015  6:00 PM Full Code [id number]  Oralia Manis, MD Inpatient   07/30/2015  5:35 PM 08/04/2015  8:34 PM Full Code [id number]  Enedina Finner, MD Inpatient   07/05/2015  1:16 AM 07/08/2015  7:22 PM Full Code [id number]  Oralia Manis, MD Inpatient   05/30/2015  7:28 PM 06/01/2015  3:36 PM Full Code [id number]  Audery Amel, MD Inpatient       TOTAL TIME TAKING CARE OF THIS PATIENT: 50 minutes.    Ihor Austin M.D on 05/12/2016 at 3:01 AM  Between 7am to 6pm - Pager - [phone]  After 6pm go to [email or web address] - password [email or web address] [facility]  [place] [facility]  Office  [phone]  CC: Primary care physician; Marisue Ivan, MD

## 2016-05-12 NOTE — ED Notes (Signed)
Pharm Tech unable to complete med rec with patient, but able to pull information from previous visits and history.  Pt unable to stay awake to give medication information.

## 2016-05-12 NOTE — Progress Notes (Signed)
[facility]  Patient Status: Active  [facility] is providing the following services: SN/PT  If patient discharges after hours, please call [phone].   Dimple CaseyJason E Hinton 05/12/2016, 9:54 AM

## 2016-05-12 NOTE — Progress Notes (Addendum)
[facility] PROGRESS NOTE  Howard LinemanJoey A [name] ZOX:[id number] DOB: [birth year] DOA: 05/11/2016 PCP: Marisue IvanLINTHAVONG, [name], MD  HPI/Subjective: Patient's major complaint is pain in his head and behind his eyes. No blurry vision. He's been falling. History of cirrhosis and alcohol abuse. He stated he relapsed on alcohol this weekend. As per the pharmacist, he has not filled his medications in a while. Pain in his right arm with fall.  Objective: Vitals:   05/12/16 0614 05/12/16 0806  BP: (!) 150/72 (!) 156/88  Pulse: 77 82  Resp: (!) 24 (!) 22  Temp:  98.5 F (36.9 C)    Filed Weights   05/11/16 2047 05/12/16 0401  Weight: (!) 154.2 kg (340 lb) (!) 151 kg (333 lb)    ROS: Review of Systems  Constitutional: Negative for chills and fever.  Eyes: Negative for blurred vision.  Respiratory: Negative for cough and shortness of breath.   Cardiovascular: Negative for chest pain.  Gastrointestinal: Positive for abdominal pain. Negative for constipation, diarrhea, nausea and vomiting.  Genitourinary: Negative for dysuria.  Musculoskeletal: Positive for joint pain.  Neurological: Positive for headaches. Negative for dizziness.   Exam: Physical Exam  Constitutional: He is oriented to person, place, and time.  HENT:  Nose: No mucosal edema.  Mouth/Throat: No oropharyngeal exudate or posterior oropharyngeal edema.  Eyes: Conjunctivae, EOM and lids are normal. Pupils are equal, round, and reactive to light.  Neck: No JVD present. Carotid bruit is not present. No edema present. No thyroid mass and no thyromegaly present.  Cardiovascular: S1 normal and S2 normal.  Exam reveals no gallop.   No murmur heard. Pulses:      Dorsalis pedis pulses are 2+ on the right side, and 2+ on the left side.  Respiratory: No respiratory distress. He has no wheezes. He has no rhonchi. He has no rales.  GI: Soft. Bowel sounds are normal. There is tenderness in the right upper quadrant.  Musculoskeletal:   Right ankle: He exhibits swelling.       Left ankle: He exhibits swelling.  Lymphadenopathy:    He has no cervical adenopathy.  Neurological: He is alert and oriented to person, place, and time. No cranial nerve deficit.  Skin: Skin is warm. No rash noted. Nails show no clubbing.  Psychiatric: He has a normal mood and affect.      Data Reviewed: Basic Metabolic Panel:  Recent Labs Lab 05/11/16 2053  NA 136  K 3.8  CL 106  CO2 25  GLUCOSE 287*  BUN 10  CREATININE 0.71  CALCIUM 8.4*   Liver Function Tests:  Recent Labs Lab 05/11/16 2053  AST 35  ALT 30  ALKPHOS 109  BILITOT 0.7  PROT 7.4  ALBUMIN 3.5    Recent Labs Lab 05/11/16 2354 05/12/16 0451  AMMONIA 95* 81*   CBC:  Recent Labs Lab 05/11/16 2053  WBC 6.4  HGB 9.1*  HCT 28.0*  MCV 80.7  PLT 126*   Cardiac Enzymes:  Recent Labs Lab 05/11/16 2053  TROPONINI <0.03   BNP (last 3 results)  Recent Labs  07/05/15 0532  BNP 21.0     Studies: Dg Chest 2 View  Result Date: 05/11/2016 CLINICAL DATA:  Pain after fall. EXAM: CHEST  2 VIEW COMPARISON:  April 13, 2016 FINDINGS: Minimal opacity in left lung base is similar compared to previous studies, likely scar or atelectasis. The cardiomediastinal silhouette is stable. No pneumothorax. No other acute abnormalities. Linear opacity over the heart on the lateral view suggests  atelectasis in the lingula or right middle lobe. IMPRESSION: No active cardiopulmonary disease. Electronically Signed   By: Gerome Samavid  Williams III M.D   On: 05/11/2016 22:14   [place] Abdomen Limited  Result Date: 05/12/2016 CLINICAL DATA:  Bowel distention EXAM: LIMITED ABDOMEN ULTRASOUND FOR ASCITES TECHNIQUE: Limited ultrasound survey for ascites was performed in all four abdominal quadrants. COMPARISON:  CT abdomen and pelvis April 27, 2016 FINDINGS: No demonstrable ascites. There are multiple loops of peristalsing bowel throughout the abdomen and pelvis. IMPRESSION: No demonstrable  ascites. Electronically Signed   By: Bretta BangWilliam  Woodruff III M.D.   On: 05/12/2016 09:55   Dg Humerus Right  Result Date: 05/11/2016 CLINICAL DATA:  Pain after fall. EXAM: RIGHT HUMERUS - 2+ VIEW COMPARISON:  None. FINDINGS: There is no evidence of fracture or other focal bone lesions. Soft tissues are unremarkable. IMPRESSION: Negative. Electronically Signed   By: Gerome Samavid  Williams III M.D   On: 05/11/2016 22:16    Scheduled Meds: . citalopram  20 mg Oral Daily  . enoxaparin (LOVENOX) injection  40 mg Subcutaneous Q12H  . furosemide  20 mg Oral BID  . insulin aspart  0-5 Units Subcutaneous QHS  . insulin aspart  0-9 Units Subcutaneous TID WC  . lactulose  30 g Oral Q6H  . losartan  100 mg Oral Daily  . metFORMIN  500 mg Oral BID WC  . metoprolol tartrate  25 mg Oral BID  . multivitamin with minerals  1 tablet Oral Daily  . nadolol  40 mg Oral Daily  . OLANZapine  20 mg Oral QHS  . pantoprazole  40 mg Oral Daily  . rifaximin  550 mg Oral BID  . sodium chloride flush  3 mL Intravenous Q12H  . spironolactone  25 mg Oral Daily   Continuous Infusions:   Assessment/Plan:  1. hepatic encephalopathy.lactulose and Xifaxan ordered. 2. Abdominal distention. Ultrasound of the abdomen did not show any ascites. Continue spironolactone and Lasix. 3. Fall. Physical therapy evaluation 4. Right shoulder and arm pain. X-rays negative 5. History alcoholic cirrhosis 6. Type 2 diabetes mellitus on Glucophage. Sliding scale ordered. 7. Essential hypertension placed back on nadolol. 8. Headache. 1 dose of Toradol ordered. Magnesium IV ordered.  Code Status:     Code Status Orders        Start     Ordered   05/12/16 0356  Full code  Continuous     05/12/16 0355    Code Status History    Date Active Date Inactive Code Status Order ID Comments User Context   04/28/2016  2:54 AM 04/29/2016  6:23 PM Full Code [id number]  [employer], DO Inpatient   03/14/2016 12:30 PM 03/16/2016  3:41 PM Full Code  [id number]  Shaune PollackQing Chen, MD Inpatient   03/03/2016 12:13 PM 03/06/2016  2:26 PM Full Code [id number]  Milagros LollSrikar Sudini, MD ED   02/10/2016  5:54 PM 02/12/2016  7:34 PM Full Code [id number]  Ramonita LabAruna Gouru, MD Inpatient   11/11/2015  5:16 AM 11/13/2015  6:00 PM Full Code [id number]  Ihor AustinPavan Pyreddy, MD Inpatient   11/07/2015 10:02 PM 11/09/2015  4:56 PM Full Code [id number]  Enid Baasadhika Kalisetti, MD Inpatient   10/28/2015  8:14 PM 10/29/2015  2:26 PM Full Code [id number]  Altamese DillingVaibhavkumar Vachhani, MD Inpatient   10/16/2015 11:47 PM 10/17/2015  6:00 PM Full Code [id number]  Oralia Manisavid Willis, MD Inpatient   07/30/2015  5:35 PM 08/04/2015  8:34 PM Full Code [id number]  Enedina FinnerSona Patel, MD Inpatient   07/05/2015  1:16 AM 07/08/2015  7:22 PM Full Code [id number]  Oralia Manis, MD Inpatient   05/30/2015  7:28 PM 06/01/2015  3:36 PM Full Code [id number]  Audery Amel, MD Inpatient     Disposition Plan: home soon  Time spent: 35 minutes  Alford Highland  [employer]

## 2016-05-12 NOTE — Plan of Care (Signed)
Problem: Safety: Goal: Ability to remain free from injury will improve Outcome: Progressing P.t. Saw and amb and got  In chair.  Assist to bsc with 1 assist.  Problem: Bowel/Gastric: Goal: Will not experience complications related to bowel motility Outcome: Progressing Lactulose given and tol  lge bm this pm

## 2016-05-13 ENCOUNTER — Ambulatory Visit: Payer: Medicare Other | Admitting: Psychiatry

## 2016-05-13 LAB — BASIC METABOLIC PANEL
ANION GAP: 4 — AB (ref 5–15)
BUN: 13 mg/dL (ref 6–20)
CHLORIDE: 106 mmol/L (ref 101–111)
CO2: 31 mmol/L (ref 22–32)
CREATININE: 0.71 mg/dL (ref 0.61–1.24)
Calcium: 8.4 mg/dL — ABNORMAL LOW (ref 8.9–10.3)
GFR calc non Af Amer: >60 mL/min (ref >60)
GLUCOSE: 97 mg/dL (ref 65–99)
Potassium: 4.1 mmol/L (ref 3.5–5.1)
Sodium: 141 mmol/L (ref 135–145)

## 2016-05-13 LAB — GLUCOSE, CAPILLARY
GLUCOSE-CAPILLARY: 136 mg/dL — AB (ref 65–99)
Glucose-Capillary: 108 mg/dL — ABNORMAL HIGH (ref 65–99)
Glucose-Capillary: 90 mg/dL (ref 65–99)
Glucose-Capillary: 130 mg/dL — ABNORMAL HIGH (ref 65–99)

## 2016-05-13 LAB — AMMONIA: Ammonia: 75 umol/L — ABNORMAL HIGH (ref 9–35)

## 2016-05-13 NOTE — Progress Notes (Signed)
[facility] PROGRESS NOTE  Howard [name] ZOX:[id number] DOB: December 24, 1980 DOA: 05/11/2016 PCP: Marisue IvanLINTHAVONG, [name], MD  HPI/Subjective: Patient still little confused. Feeling better today. No abdominal pain. No headache. No joint pains.  Objective: Vitals:   05/13/16 0955 05/13/16 1213  BP: (!) 153/81 133/67  Pulse: 70 61  Resp: (!) 22 16  Temp: 98.3 F (36.8 C) 97.9 F (36.6 C)    Filed Weights   05/11/16 2047 05/12/16 0401  Weight: (!) 154.2 kg (340 lb) (!) 151 kg (333 lb)    ROS: Review of Systems  Constitutional: Negative for chills and fever.  Eyes: Negative for blurred vision.  Respiratory: Negative for cough and shortness of breath.   Cardiovascular: Negative for chest pain.  Gastrointestinal: Negative for abdominal pain, constipation, diarrhea, nausea and vomiting.  Genitourinary: Negative for dysuria.  Musculoskeletal: Negative for joint pain.  Neurological: Negative for dizziness and headaches.   Exam: Physical Exam  Constitutional: He is oriented to person, place, and time.  HENT:  Nose: No mucosal edema.  Mouth/Throat: No oropharyngeal exudate or posterior oropharyngeal edema.  Eyes: Conjunctivae, EOM and lids are normal. Pupils are equal, round, and reactive to light.  Neck: No JVD present. Carotid bruit is not present. No edema present. No thyroid mass and no thyromegaly present.  Cardiovascular: S1 normal and S2 normal.  Exam reveals no gallop.   No murmur heard. Pulses:      Dorsalis pedis pulses are 2+ on the right side, and 2+ on the left side.  Respiratory: No respiratory distress. He has no wheezes. He has no rhonchi. He has no rales.  GI: Soft. Bowel sounds are normal. There is tenderness in the right upper quadrant.  Musculoskeletal:       Right ankle: He exhibits swelling.       Left ankle: He exhibits swelling.  Lymphadenopathy:    He has no cervical adenopathy.  Neurological: He is alert and oriented to person, place, and time. No cranial  nerve deficit.  Skin: Skin is warm. No rash noted. Nails show no clubbing.  Psychiatric: He has a normal mood and affect.      Data Reviewed: Basic Metabolic Panel:  Recent Labs Lab 05/11/16 2053 05/13/16 0438  NA 136 141  K 3.8 4.1  CL 106 106  CO2 25 31  GLUCOSE 287* 97  BUN 10 13  CREATININE 0.71 0.71  CALCIUM 8.4* 8.4*   Liver Function Tests:  Recent Labs Lab 05/11/16 2053  AST 35  ALT 30  ALKPHOS 109  BILITOT 0.7  PROT 7.4  ALBUMIN 3.5    Recent Labs Lab 05/11/16 2354 05/12/16 0451 05/13/16 0438  AMMONIA 95* 81* 75*   CBC:  Recent Labs Lab 05/11/16 2053  WBC 6.4  HGB 9.1*  HCT 28.0*  MCV 80.7  PLT 126*   Cardiac Enzymes:  Recent Labs Lab 05/11/16 2053  TROPONINI <0.03   BNP (last 3 results)  Recent Labs  07/05/15 0532  BNP 21.0     Studies: Dg Chest 2 View  Result Date: 05/11/2016 CLINICAL DATA:  Pain after fall. EXAM: CHEST  2 VIEW COMPARISON:  April 13, 2016 FINDINGS: Minimal opacity in left lung base is similar compared to previous studies, likely scar or atelectasis. The cardiomediastinal silhouette is stable. No pneumothorax. No other acute abnormalities. Linear opacity over the heart on the lateral view suggests atelectasis in the lingula or right middle lobe. IMPRESSION: No active cardiopulmonary disease. Electronically Signed   By: Gerome Samavid  Williams  III M.D   On: 05/11/2016 22:14   [place] Abdomen Limited  Result Date: 05/12/2016 CLINICAL DATA:  Bowel distention EXAM: LIMITED ABDOMEN ULTRASOUND FOR ASCITES TECHNIQUE: Limited ultrasound survey for ascites was performed in all four abdominal quadrants. COMPARISON:  CT abdomen and pelvis April 27, 2016 FINDINGS: No demonstrable ascites. There are multiple loops of peristalsing bowel throughout the abdomen and pelvis. IMPRESSION: No demonstrable ascites. Electronically Signed   By: Bretta BangWilliam  Woodruff III M.D.   On: 05/12/2016 09:55   Dg Humerus Right  Result Date: 05/11/2016 CLINICAL  DATA:  Pain after fall. EXAM: RIGHT HUMERUS - 2+ VIEW COMPARISON:  None. FINDINGS: There is no evidence of fracture or other focal bone lesions. Soft tissues are unremarkable. IMPRESSION: Negative. Electronically Signed   By: Gerome Samavid  Williams III M.D   On: 05/11/2016 22:16    Scheduled Meds: . citalopram  20 mg Oral Daily  . enoxaparin (LOVENOX) injection  40 mg Subcutaneous Q12H  . furosemide  20 mg Oral BID  . insulin aspart  0-5 Units Subcutaneous QHS  . insulin aspart  0-9 Units Subcutaneous TID WC  . lactulose  30 g Oral Q6H  . losartan  100 mg Oral Daily  . metFORMIN  500 mg Oral BID WC  . metoprolol tartrate  25 mg Oral BID  . multivitamin with minerals  1 tablet Oral Daily  . nadolol  40 mg Oral Daily  . OLANZapine  20 mg Oral QHS  . pantoprazole  40 mg Oral Daily  . rifaximin  550 mg Oral BID  . sodium chloride flush  3 mL Intravenous Q12H  . spironolactone  25 mg Oral Daily    Assessment/Plan:  1. Hepatic encephalopathy.  Continue lactulose and Xifaxan. Patient started having bowel movements today so hopefully the ammonia level will come down tomorrow. 2. Abdominal distention. Ultrasound of the abdomen did not show any ascites. Continue spironolactone and Lasix. 3. Fall. Physical therapy recommended home health 4. Right shoulder and arm pain. X-rays negative 5. History alcoholic cirrhosis 6. Type 2 diabetes mellitus on Glucophage. Sliding scale ordered. 7. Essential hypertension placed back on nadolol. 8. Headache. Resolved yesterday.  Code Status:     Code Status Orders        Start     Ordered   05/12/16 0356  Full code  Continuous     05/12/16 0355    Code Status History    Date Active Date Inactive Code Status Order ID Comments User Context   04/28/2016  2:54 AM 04/29/2016  6:23 PM Full Code [id number]  [employer] Hugelmeyer, DO Inpatient   03/14/2016 12:30 PM 03/16/2016  3:41 PM Full Code [id number]  Shaune PollackQing Chen, MD Inpatient   03/03/2016 12:13 PM 03/06/2016  2:26 PM  Full Code [id number]  Milagros LollSrikar Sudini, MD ED   02/10/2016  5:54 PM 02/12/2016  7:34 PM Full Code [id number]  Ramonita LabAruna Gouru, MD Inpatient   11/11/2015  5:16 AM 11/13/2015  6:00 PM Full Code [id number]  Ihor AustinPavan Pyreddy, MD Inpatient   11/07/2015 10:02 PM 11/09/2015  4:56 PM Full Code [id number]  Enid Baasadhika Kalisetti, MD Inpatient   10/28/2015  8:14 PM 10/29/2015  2:26 PM Full Code [id number]  Altamese DillingVaibhavkumar Vachhani, MD Inpatient   10/16/2015 11:47 PM 10/17/2015  6:00 PM Full Code [id number]  Oralia Manisavid Willis, MD Inpatient   07/30/2015  5:35 PM 08/04/2015  8:34 PM Full Code [id number]  Enedina FinnerSona Patel, MD Inpatient   07/05/2015  1:16 AM 07/08/2015  7:22 PM Full Code [id number]  Oralia Manis, MD Inpatient   05/30/2015  7:28 PM 06/01/2015  3:36 PM Full Code [id number]  Audery Amel, MD Inpatient     Disposition Plan: home Potentially tomorrow if ammonia level comes down. Case discussed with mother at the bedside  Time spent: 25 minutes  Alford Highland  [employer]

## 2016-05-14 LAB — GLUCOSE, CAPILLARY
GLUCOSE-CAPILLARY: 86 mg/dL (ref 65–99)
GLUCOSE-CAPILLARY: 114 mg/dL — AB (ref 65–99)
GLUCOSE-CAPILLARY: 104 mg/dL — AB (ref 65–99)
Glucose-Capillary: 74 mg/dL (ref 65–99)

## 2016-05-14 LAB — AMMONIA: Ammonia: 97 umol/L — ABNORMAL HIGH (ref 9–35)

## 2016-05-14 LAB — CBC
HEMATOCRIT: 28.2 % — AB (ref 40.0–52.0)
Hemoglobin: 9 g/dL — ABNORMAL LOW (ref 13.0–18.0)
MCH: 25.9 pg — ABNORMAL LOW (ref 26.0–34.0)
MCHC: 32 g/dL (ref 32.0–36.0)
MCV: 80.9 fL (ref 80.0–100.0)
PLATELETS: 108 10*3/uL — AB (ref 150–440)
RBC: 3.49 MIL/uL — AB (ref 4.40–5.90)
RDW: 16.6 % — AB (ref 11.5–14.5)
WBC: 5.7 10*3/uL (ref 3.8–10.6)

## 2016-05-14 MED ORDER — LACTULOSE 10 GM/15ML PO SOLN
30.0000 g | ORAL | Status: DC
  Administered 2016-05-14 – 2016-05-15 (×4): 30 g via ORAL
  Filled 2016-05-14 (×6): qty 60

## 2016-05-14 NOTE — Progress Notes (Signed)
[facility] PROGRESS NOTE  Andee LinemanJoey A Medici ZOX:[id number] DOB: 11/08/1980 DOA: 05/11/2016 PCP: Marisue IvanLINTHAVONG, [name], MD  HPI/Subjective: Patient was hoping to go home today. Ammonia level today was higher than when he came in. No complaints of headache or abdominal pain.  Objective: Vitals:   05/13/16 2029 05/14/16 0506  BP: 133/63 133/73  Pulse: 69 66  Resp: 20 20  Temp: 98.5 F (36.9 C) 98.2 F (36.8 C)    Filed Weights   05/11/16 2047 05/12/16 0401  Weight: (!) 340 lb (154.2 kg) (!) 333 lb (151 kg)    ROS: Review of Systems  Constitutional: Negative for chills and fever.  Eyes: Negative for blurred vision.  Respiratory: Negative for cough and shortness of breath.   Cardiovascular: Negative for chest pain.  Gastrointestinal: Negative for abdominal pain, constipation, diarrhea, nausea and vomiting.  Genitourinary: Negative for dysuria.  Musculoskeletal: Negative for joint pain.  Neurological: Negative for dizziness and headaches.   Exam: Physical Exam  Constitutional: He is oriented to person, place, and time.  HENT:  Nose: No mucosal edema.  Mouth/Throat: No oropharyngeal exudate or posterior oropharyngeal edema.  Eyes: Conjunctivae, EOM and lids are normal. Pupils are equal, round, and reactive to light.  Neck: No JVD present. Carotid bruit is not present. No edema present. No thyroid mass and no thyromegaly present.  Cardiovascular: S1 normal and S2 normal.  Exam reveals no gallop.   No murmur heard. Pulses:      Dorsalis pedis pulses are 2+ on the right side, and 2+ on the left side.  Respiratory: No respiratory distress. He has no wheezes. He has no rhonchi. He has no rales.  GI: Soft. Bowel sounds are normal. There is tenderness in the right upper quadrant.  Musculoskeletal:       Right ankle: He exhibits swelling.       Left ankle: He exhibits swelling.  Lymphadenopathy:    He has no cervical adenopathy.  Neurological: He is alert and oriented to person,  place, and time. No cranial nerve deficit.  Skin: Skin is warm. No rash noted. Nails show no clubbing.  Psychiatric: He has a normal mood and affect.      Data Reviewed: Basic Metabolic Panel:  Recent Labs Lab 05/11/16 2053 05/13/16 0438  NA 136 141  K 3.8 4.1  CL 106 106  CO2 25 31  GLUCOSE 287* 97  BUN 10 13  CREATININE 0.71 0.71  CALCIUM 8.4* 8.4*   Liver Function Tests:  Recent Labs Lab 05/11/16 2053  AST 35  ALT 30  ALKPHOS 109  BILITOT 0.7  PROT 7.4  ALBUMIN 3.5    Recent Labs Lab 05/11/16 2354 05/12/16 0451 05/13/16 0438 05/14/16 0408  AMMONIA 95* 81* 75* 97*   CBC:  Recent Labs Lab 05/11/16 2053 05/14/16 0408  WBC 6.4 5.7  HGB 9.1* 9.0*  HCT 28.0* 28.2*  MCV 80.7 80.9  PLT 126* 108*   Cardiac Enzymes:  Recent Labs Lab 05/11/16 2053  TROPONINI <0.03   BNP (last 3 results)  Recent Labs  07/05/15 0532  BNP 21.0      Scheduled Meds: . citalopram  20 mg Oral Daily  . enoxaparin (LOVENOX) injection  40 mg Subcutaneous Q12H  . furosemide  20 mg Oral BID  . insulin aspart  0-5 Units Subcutaneous QHS  . insulin aspart  0-9 Units Subcutaneous TID WC  . lactulose  30 g Oral Q4H while awake  . losartan  100 mg Oral Daily  .  metFORMIN  500 mg Oral BID WC  . metoprolol tartrate  25 mg Oral BID  . multivitamin with minerals  1 tablet Oral Daily  . nadolol  40 mg Oral Daily  . OLANZapine  20 mg Oral QHS  . pantoprazole  40 mg Oral Daily  . rifaximin  550 mg Oral BID  . sodium chloride flush  3 mL Intravenous Q12H  . spironolactone  25 mg Oral Daily    Assessment/Plan:  1. Hepatic encephalopathy.  Increase lactulose 30 g by mouth every 4 hours while awake and Xifaxan. Ammonia level higher today than when he came in. 2. Abdominal distention. Ultrasound of the abdomen did not show any ascites. Continue spironolactone and Lasix. 3. Fall. Physical therapy recommended home health 4. Right shoulder and arm pain. X-rays  negative 5. History alcoholic cirrhosis 6. Type 2 diabetes mellitus on Glucophage. Sliding scale ordered. 7. Essential hypertension placed back on nadolol. 8. Headache. Resolved yesterday.  Code Status:     Code Status Orders        Start     Ordered   05/12/16 0356  Full code  Continuous     05/12/16 0355    Code Status History    Date Active Date Inactive Code Status Order ID Comments User Context   04/28/2016  2:54 AM 04/29/2016  6:23 PM Full Code [id number]  [employer] Hugel[employer]yer, DO Inpatient   03/14/2016 12:30 PM 03/16/2016  3:41 PM Full Code [id number]  Shaune PollackQing Chen, MD Inpatient   03/03/2016 12:13 PM 03/06/2016  2:26 PM Full Code [id number]  Milagros LollSrikar Sudini, MD ED   02/10/2016  5:54 PM 02/12/2016  7:34 PM Full Code [id number]  Ramonita LabAruna Gouru, MD Inpatient   11/11/2015  5:16 AM 11/13/2015  6:00 PM Full Code [id number]  Ihor AustinPavan Pyreddy, MD Inpatient   11/07/2015 10:02 PM 11/09/2015  4:56 PM Full Code [id number]  Enid Baasadhika Kalisetti, MD Inpatient   10/28/2015  8:14 PM 10/29/2015  2:26 PM Full Code [id number]  Altamese DillingVaibhavkumar Vachhani, MD Inpatient   10/16/2015 11:47 PM 10/17/2015  6:00 PM Full Code [id number]  Oralia Manisavid Willis, MD Inpatient   07/30/2015  5:35 PM 08/04/2015  8:34 PM Full Code [id number]  Enedina FinnerSona Patel, MD Inpatient   07/05/2015  1:16 AM 07/08/2015  7:22 PM Full Code [id number]  Oralia Manisavid Willis, MD Inpatient   05/30/2015  7:28 PM 06/01/2015  3:36 PM Full Code [id number]  Audery AmelJohn T [name], MD Inpatient     Disposition Plan: home Soon depending on when ammonia level comes back lower.  Time spent: 24 minutes  Alford HighlandWIETING, Braeson Rupe  [employer] [facility]

## 2016-05-14 NOTE — Progress Notes (Signed)
Physical Therapy Treatment Patient Details Name: Howard LinemanJoey A Gaudin MRN: [id number] DOB: [birth year] Today's Date: 05/14/2016    History of Present Illness Pt is a 35 y.o. male admitted s/p fall onto R side/arm (knees gave out).  Pt with recent admit d/t hepatic encephalopathy after a fall.  Pt now admitted with AMS secondary to hepatic encepahlopathy.  PMH includes htn, EtOH abuse, COPD, DM, CAD, HLD, schizophrenia, alcoholic/liver cirrhosis.    PT Comments    Pt in bed ready for session.  Transitioned out of bed without assist.  Stood and was able to ambulate with min guard 200' on room air without complaints of SOB or dizziness.  Upon return to room pt needed verbal cues and education to keep walker with him to approach the recliner.  Encouraged pt to use walker at all times at home to help decrease fall risk.  O2 sats on room air 97% after gait.  Discussed with nurse.   Follow Up Recommendations  Home health PT;Supervision for mobility/OOB     Equipment Recommendations  None recommended by PT    Recommendations for Other Services       Precautions / Restrictions Precautions Precautions: Fall Restrictions Weight Bearing Restrictions: No    Mobility  Bed Mobility Overal bed mobility: Modified Independent Bed Mobility: Supine to Sit     Supine to sit: Modified independent (Device/Increase time)        Transfers Overall transfer level: Needs assistance Equipment used: Rolling walker (2 wheeled) Transfers: Sit to/from Stand Sit to Stand: Min guard            Ambulation/Gait Ambulation/Gait assistance: Min guard Ambulation Distance (Feet): 200 Feet Assistive device: Rolling walker (2 wheeled) Gait Pattern/deviations: Step-through pattern Gait velocity: decreased   General Gait Details: no complaints of dizziness or knees buckling today   Stairs            Wheelchair Mobility    Modified Rankin (Stroke Patients Only)       Balance Overall balance  assessment: Needs assistance Sitting-balance support: Feet supported Sitting balance-Leahy Scale: Good     Standing balance support: Bilateral upper extremity supported Standing balance-Leahy Scale: Fair                      Cognition Arousal/Alertness: Awake/alert Behavior During Therapy: WFL for tasks assessed/performed Overall Cognitive Status: History of cognitive impairments - at baseline                      Exercises      General Comments        Pertinent Vitals/Pain Pain Assessment: No/denies pain    Home Living                      Prior Function            PT Goals (current goals can now be found in the care plan section) Acute Rehab PT Goals Patient Stated Goal: to go home Progress towards PT goals: Progressing toward goals    Frequency  Min 2X/week    PT Plan Current plan remains appropriate    Co-evaluation             End of Session Equipment Utilized During Treatment: Gait belt Activity Tolerance: Patient tolerated treatment well Patient left: in chair;with call bell/phone within reach;with chair alarm set;with nursing/sitter in room;with family/visitor present     Time: [id number] PT Time Calculation (min) (ACUTE ONLY): 23 min  Charges:  $Gait Training: 23-37 mins                    G Codes:      Danielle DessSarah Nikolaj Geraghty 05/14/2016, 12:07 PM

## 2016-05-15 LAB — GLUCOSE, CAPILLARY: Glucose-Capillary: 88 mg/dL (ref 65–99)

## 2016-05-15 LAB — AMMONIA: Ammonia: 72 umol/L — ABNORMAL HIGH (ref 9–35)

## 2016-05-15 MED ORDER — SPIRONOLACTONE 25 MG PO TABS: 25.0000 mg | ORAL_TABLET | Freq: Every day | ORAL | 0 refills | Status: DC

## 2016-05-15 MED ORDER — LACTULOSE 10 GM/15ML PO SOLN: 30.0000 g | Freq: Four times a day (QID) | ORAL | 2 refills | Status: DC

## 2016-05-15 MED ORDER — OLANZAPINE 20 MG PO TABS: 20.0000 mg | ORAL_TABLET | Freq: Every day | ORAL | 0 refills | Status: DC

## 2016-05-15 MED ORDER — RIFAXIMIN 550 MG PO TABS: 550.0000 mg | ORAL_TABLET | Freq: Two times a day (BID) | ORAL | 0 refills | Status: DC

## 2016-05-15 MED ORDER — LOSARTAN POTASSIUM 100 MG PO TABS: 100.0000 mg | ORAL_TABLET | Freq: Every day | ORAL | 0 refills | Status: DC

## 2016-05-15 MED ORDER — FUROSEMIDE 20 MG PO TABS: 20.0000 mg | ORAL_TABLET | Freq: Every day | ORAL | 0 refills | Status: DC

## 2016-05-15 MED ORDER — METFORMIN HCL 500 MG PO TABS: 500.0000 mg | ORAL_TABLET | Freq: Two times a day (BID) | ORAL | 0 refills | Status: DC

## 2016-05-15 NOTE — Care Management Important Message (Signed)
Important Message  Patient Details  Name: Howard [name] MRN: [id number] Date of Birth: Aug 09, 1981   Medicare Important Message Given:  Yes    Gwenette GreetBrenda S Charlyne Robertshaw, RN 05/15/2016, 8:24 AM

## 2016-05-15 NOTE — Care Management (Addendum)
Discharge to home today per Dr. Renae GlossWieting. Will be followed by [facility] for Nursing and Physical therapy. Xifaxan 550 coupon printed off the internet for free medication. [name] was instructed to take this coupon to any [facility] store and present to pharmacist. Family will transport. Gwenette GreetBrenda S Jeneva Schweizer RN MSN CCM Care Management [phone]

## 2016-05-15 NOTE — Progress Notes (Signed)
Pt being discharged home, discharge instructions and prescriptions reviewed with pt and mother, states understanding, no complaints at discharge, no distress or discomfort noted

## 2016-05-15 NOTE — Progress Notes (Signed)
Physical Therapy Treatment Patient Details Name: Howard LinemanJoey A [name] MRN: [id number] DOB: 01/19/1981 Today's Date: 05/15/2016    History of Present Illness Pt is a 35 y.o. male admitted s/p fall onto R side/arm (knees gave out).  Pt with recent admit d/t hepatic encephalopathy after a fall.  Pt now admitted with AMS secondary to hepatic encepahlopathy.  PMH includes htn, EtOH abuse, COPD, DM, CAD, HLD, schizophrenia, alcoholic/liver cirrhosis.    PT Comments    Pt seen this morning to ensure ability/safety with stair climbing. Pt initially sleeping, but easily awoken and agreeable. Pt performs bed mobility with Mod I and ambulates 100 ft with rolling walker with stiff gait, but no loss of balance. Pt negotiates up/down 6 steps with step to pattern; pt lacks confidence subjectively with descending, so encouraged step to pattern with bilateral hands on 1 rail. Pt performs without issue. Back in the room, pt ambulates without assistive device to and from bathroom with supervision without issue as well. Pt discharged from inpatient at the time of this note.   Follow Up Recommendations  Home health PT;Supervision for mobility/OOB     Equipment Recommendations  None recommended by PT    Recommendations for Other Services       Precautions / Restrictions Precautions Precautions: Fall Restrictions Weight Bearing Restrictions: No    Mobility  Bed Mobility Overal bed mobility: Modified Independent Bed Mobility: Supine to Sit;Sit to Supine     Supine to sit: Modified independent (Device/Increase time) Sit to supine: Modified independent (Device/Increase time)   General bed mobility comments: use of rails  Transfers Overall transfer level: Modified independent Equipment used: Rolling walker (2 wheeled) Transfers: Sit to/from Stand Sit to Stand: Modified independent (Device/Increase time)         General transfer comment: Good use of safety  Ambulation/Gait Ambulation/Gait assistance:  Supervision Ambulation Distance (Feet): 100 Feet (to/from bathroom also) Assistive device: Rolling walker (2 wheeled) (no AD with amb to/from bathroom) Gait Pattern/deviations: Step-through pattern Gait velocity: decreased Gait velocity interpretation: Below normal speed for age/gender General Gait Details: Slow, no head turns, stiff body posture   Stairs Stairs: Yes Stairs assistance: Min guard Stair Management: One rail Left;Step to pattern (descend with 1 rail and 2 hands on rail) Number of Stairs: 6 General stair comments: feels more unsteady descending; suggested 2 hands on 1 rail with imrproved security  Wheelchair Mobility    Modified Rankin (Stroke Patients Only)       Balance Overall balance assessment: Needs assistance Sitting-balance support: Feet supported Sitting balance-Leahy Scale: Normal     Standing balance support: Bilateral upper extremity supported Standing balance-Leahy Scale: Good Standing balance comment: ambulates to/from bath without AD or LOB                    Cognition Arousal/Alertness: Awake/alert (initially sleeping, awoken easily) Behavior During Therapy: WFL for tasks assessed/performed Overall Cognitive Status: History of cognitive impairments - at baseline                      Exercises      General Comments        Pertinent Vitals/Pain Pain Assessment: No/denies pain    Home Living                      Prior Function            PT Goals (current goals can now be found in the care plan section) Progress  towards PT goals: Progressing toward goals    Frequency  Min 2X/week    PT Plan Current plan remains appropriate    Co-evaluation             End of Session Equipment Utilized During Treatment: Gait belt Activity Tolerance: Patient tolerated treatment well Patient left: in bed;with call bell/phone within reach;with bed alarm set     Time: 1005-1018 PT Time Calculation (min) (ACUTE  ONLY): 13 min  Charges:  $Gait Training: 8-22 mins                    G Codes:      Kristeen MissHeidi Elizabeth Bishop, PTA 05/15/2016, 1:04 PM

## 2016-05-15 NOTE — Discharge Summary (Signed)
[facility] - [place] at [facility]   PATIENT NAME: Howard [name]    MR#:  [id number]  DATE OF BIRTH:  August 30, 1981  DATE OF ADMISSION:  05/11/2016 ADMITTING PHYSICIAN: Ihor Austin, MD  DATE OF DISCHARGE: 05/15/2016 11:52 AM  PRIMARY CARE PHYSICIAN: Marisue Ivan, MD    ADMISSION DIAGNOSIS:  Hepatic encephalopathy (HCC) [K72.90] Dizziness [R42] Right arm pain [M79.601] Fall, initial encounter [W19.XXXA]  DISCHARGE DIAGNOSIS:  Active Problems:   Hepatic encephalopathy (HCC)   Altered mental status   SECONDARY DIAGNOSIS:   Past Medical History:  Diagnosis Date  . Alcoholic cirrhosis of liver with ascites (HCC)   . Anemia   . COPD (chronic obstructive pulmonary disease) (HCC)   . Depression   . Diabetes (HCC)   . Diabetes mellitus, type II (HCC)   . Esophageal varices (HCC)   . Heart disease   . Hyperlipemia   . Hypertension   . Liver disease   . Multiple thyroid nodules   . Portal hypertensive gastropathy   . Schizophrenia (HCC)     HOSPITAL COURSE:   1. Hepatic encephalopathy. Patient at home was taking lactulose twice a day. I don't think he was taking his Xifaxan either. Patient came in with confusion and falls. I did increase his lactulose to 30 g every 4 hours while awake. We can probably titrate that down to 4 times a day and then potentially 3 times a day to titrate to 3 bowel movements a day. The care manager gave the patient a coupon for Xifaxan at [facility]. Hopefully the patient will fill this. Ammonia level did fluctuate during the event hospital course but down into the 70s upon disposition. 2. Abdominal distention. Ultrasound of the abdomen did not show any ascites. Continue spironolactone and Lasix 3. Fall physical therapy recommended home with home health. 4. Right shoulder pain and arm pain. X-rays are negative. 5. History of alcoholic cirrhosis. If the patient ever once a liver transplant he must be without alcohol for greater than 6  months. Patient would have to be referred to [facility] gastroenterology for that. 6. Type 2 diabetes mellitus on Glucophage 7. Essential hypertension on medical 8. Headache resolved 9. Anemia and thrombocytopenia likely secondary to cirrhosis  DISCHARGE CONDITIONS:   Satisfactory  CONSULTS OBTAINED:    DRUG ALLERGIES:  No Known Allergies  DISCHARGE MEDICATIONS:   Discharge Medication List as of 05/15/2016 11:30 AM    CONTINUE these medications which have CHANGED   Details  furosemide (LASIX) 20 MG tablet Take 1 tablet (20 mg total) by mouth daily., Starting Thu 05/15/2016, Print    lactulose (CHRONULAC) 10 GM/15ML solution Take 45 mLs (30 g total) by mouth 4 (four) times daily., Starting Thu 05/15/2016, Print    losartan (COZAAR) 100 MG tablet Take 1 tablet (100 mg total) by mouth daily., Starting Thu 05/15/2016, Print    metFORMIN (GLUCOPHAGE) 500 MG tablet Take 1 tablet (500 mg total) by mouth 2 (two) times daily with a meal., Starting Thu 05/15/2016, Print    OLANZapine (ZYPREXA) 20 MG tablet Take 1 tablet (20 mg total) by mouth at bedtime., Starting Thu 05/15/2016, Print    rifaximin (XIFAXAN) 550 MG TABS tablet Take 1 tablet (550 mg total) by mouth 2 (two) times daily., Starting Thu 05/15/2016, Print    spironolactone (ALDACTONE) 25 MG tablet Take 1 tablet (25 mg total) by mouth daily., Starting Thu 05/15/2016, Print      CONTINUE these medications which have NOT CHANGED   Details  Multiple  Vitamin (MULTIVITAMIN WITH MINERALS) TABS tablet Take 1 tablet by mouth daily., Starting Sat 08/04/2015, Normal    nadolol (CORGARD) 20 MG tablet Take 20 mg by mouth daily., Historical Med    omeprazole (PRILOSEC) 40 MG capsule Take 1 capsule (40 mg total) by mouth every morning., Starting Tue 02/12/2016, Normal    ondansetron (ZOFRAN) 4 MG tablet Take 4 mg by mouth every 8 (eight) hours as needed for nausea., Historical Med    sucralfate (CARAFATE) 1 g tablet Take 1 g by mouth 4 (four) times  daily., Historical Med      STOP taking these medications     citalopram (CELEXA) 20 MG tablet      metoprolol tartrate (LOPRESSOR) 25 MG tablet          DISCHARGE INSTRUCTIONS:   Follow-up PMD one week  If you experience worsening of your admission symptoms, develop shortness of breath, life threatening emergency, suicidal or homicidal thoughts you must seek medical attention immediately by calling 911 or calling your MD immediately  if symptoms less severe.  You Must read complete instructions/literature along with all the possible adverse reactions/side effects for all the Medicines you take and that have been prescribed to you. Take any new Medicines after you have completely understood and accept all the possible adverse reactions/side effects.   Please note  You were cared for by a hospitalist during your hospital stay. If you have any questions about your discharge medications or the care you received while you were in the hospital after you are discharged, you can call the unit and asked to speak with the hospitalist on call if the hospitalist that took care of you is not available. Once you are discharged, your primary care physician will handle any further medical issues. Please note that NO REFILLS for any discharge medications will be authorized once you are discharged, as it is imperative that you return to your primary care physician (or establish a relationship with a primary care physician if you do not have one) for your aftercare needs so that they can reassess your need for medications and monitor your lab values.    Today   CHIEF COMPLAINT:   Chief Complaint  Patient presents with  . Fall  . Arm Pain  . Chest Pain  . Dizziness    HISTORY OF PRESENT ILLNESS:  Howard [name]  is a 35 y.o. male with a known history of Alcoholic cirrhosis presents with fall and arm pain. Found to have an elevated ammonia level.   VITAL SIGNS:  Blood pressure (!) 142/78, pulse  74, temperature 98 F (36.7 C), temperature source Oral, resp. rate 20, height 6\' 3"  (1.905 m), weight (!) 151 kg (333 lb), SpO2 93 %.   PHYSICAL EXAMINATION:  GENERAL:  35 y.o.-year-old patient lying in the bed with no acute distress.  EYES: Pupils equal, round, reactive to light and accommodation. No scleral icterus. Extraocular muscles intact.  HEENT: Head atraumatic, normocephalic. Oropharynx and nasopharynx clear.  NECK:  Supple, no jugular venous distention. No thyroid enlargement, no tenderness.  LUNGS: Normal breath sounds bilaterally, no wheezing, rales,rhonchi or crepitation. No use of accessory muscles of respiration.  CARDIOVASCULAR: S1, S2 normal. No murmurs, rubs, or gallops.  ABDOMEN: Soft, non-tender, non-distended. Bowel sounds present. No organomegaly or mass.  EXTREMITIES: Trace edema, no cyanosis, or clubbing.  NEUROLOGIC: Cranial nerves II through XII are intact. Muscle strength 5/5 in all extremities. Sensation intact. Gait not checked.  PSYCHIATRIC: The patient is alert and  oriented x 3.  SKIN: No obvious rash, lesion, or ulcer.   DATA REVIEW:   CBC  Recent Labs Lab 05/14/16 0408  WBC 5.7  HGB 9.0*  HCT 28.2*  PLT 108*    Chemistries   Recent Labs Lab 05/11/16 2053 05/13/16 0438  NA 136 141  K 3.8 4.1  CL 106 106  CO2 25 31  GLUCOSE 287* 97  BUN 10 13  CREATININE 0.71 0.71  CALCIUM 8.4* 8.4*  AST 35  --   ALT 30  --   ALKPHOS 109  --   BILITOT 0.7  --     Cardiac Enzymes  Recent Labs Lab 05/11/16 2053  TROPONINI <0.03    Microbiology Results  Results for orders placed or performed during the hospital encounter of 03/03/16  Blood culture (routine x 2)     Status: None   Collection Time: 03/03/16  8:48 AM  Result Value Ref Range Status   Specimen Description BLOOD RIGHT AC  Final   Special Requests   Final    BOTTLES DRAWN AEROBIC AND ANAEROBIC AER ANA   Culture NO GROWTH 8 DAYS  Final   Report Status 03/11/2016 FINAL   Final  Blood culture (routine x 2)     Status: None   Collection Time: 03/03/16  8:48 AM  Result Value Ref Range Status   Specimen Description BLOOD LEFT HAND  Final   Special Requests BOTTLES DRAWN AEROBIC AND ANAEROBIC 11 ML  Final   Culture NO GROWTH 8 DAYS  Final   Report Status 03/11/2016 FINAL  Final       Management plans discussed with the patient, family and they are in agreement.  CODE STATUS:     Code Status Orders        Start     Ordered   05/12/16 0356  Full code  Continuous     05/12/16 0355    Code Status History    Date Active Date Inactive Code Status Order ID Comments User Context   04/28/2016  2:54 AM 04/29/2016  6:23 PM Full Code [id number]  [employer], DO Inpatient   03/14/2016 12:30 PM 03/16/2016  3:41 PM Full Code [id number]  Shaune Pollack, MD Inpatient   03/03/2016 12:13 PM 03/06/2016  2:26 PM Full Code [id number]  Milagros Loll, MD ED   02/10/2016  5:54 PM 02/12/2016  7:34 PM Full Code [id number]  Ramonita Lab, MD Inpatient   11/11/2015  5:16 AM 11/13/2015  6:00 PM Full Code [id number]  Ihor Austin, MD Inpatient   11/07/2015 10:02 PM 11/09/2015  4:56 PM Full Code [id number]  Enid Baas, MD Inpatient   10/28/2015  8:14 PM 10/29/2015  2:26 PM Full Code [id number]  Altamese Dilling, MD Inpatient   10/16/2015 11:47 PM 10/17/2015  6:00 PM Full Code [id number]  Oralia Manis, MD Inpatient   07/30/2015  5:35 PM 08/04/2015  8:34 PM Full Code [id number]  Enedina Finner, MD Inpatient   07/05/2015  1:16 AM 07/08/2015  7:22 PM Full Code [id number]  Oralia Manis, MD Inpatient   05/30/2015  7:28 PM 06/01/2015  3:36 PM Full Code [id number]  Audery Amel, MD Inpatient      TOTAL TIME TAKING CARE OF THIS PATIENT: 35 minutes.    Alford Highland M.D on 05/15/2016 at 2:52 PM  Between 7am to 6pm - Pager - [phone]  After 6pm go to [email or web address] - password [email or web address]  [facility] Office  [phone]  CC: Primary care physician;  Dion Body, MD

## 2016-05-22 ENCOUNTER — Ambulatory Visit (INDEPENDENT_AMBULATORY_CARE_PROVIDER_SITE_OTHER): Payer: Medicare Other | Admitting: Psychiatry

## 2016-05-22 ENCOUNTER — Encounter: Payer: Self-pay | Admitting: Psychiatry

## 2016-05-22 VITALS — BP 127/83 | HR 77 | Temp 98.3°F

## 2016-05-22 DIAGNOSIS — F200 Paranoid schizophrenia: Secondary | ICD-10-CM

## 2016-05-22 DIAGNOSIS — F1010 Alcohol abuse, uncomplicated: Secondary | ICD-10-CM | POA: Diagnosis not present

## 2016-05-22 MED ORDER — OLANZAPINE 15 MG PO TABS: 30.0000 mg | ORAL_TABLET | Freq: Every day | ORAL | 5 refills | Status: DC

## 2016-05-22 NOTE — Progress Notes (Signed)
[place] up from 35 year old man with schizophrenia and alcohol abuse. Patient has been in the hospital recently. Liver problems are getting even worse. On the plus side he has not had any alcohol in 2 weeks. Feels pleased with himself. Mood has been stable. Continues however to have frequent auditory hallucinations. No suicidal ideation.  Patient is now in a wheelchair and on oxygen full time. Awake and alert. Good eye contact. Blunted affect. Continues to have auditory hallucinations. No active suicidal thoughts or homicidal thoughts. Alert and oriented 4.  Supportive counseling. Strongly encouraged his continued efforts to stay off the alcohol. Supported his efforts to work on Economistcognitive skills to overcome his hallucinations. Also suggest we try increasing his Zyprexa to 30 mg at night. He agrees to the plan. New prescription done. Follow-up in 2 months.

## 2016-05-25 ENCOUNTER — Emergency Department
Admission: EM | Admit: 2016-05-25 | Discharge: 2016-05-25 | Disposition: A | Discharge status: Home / Self Care | Payer: Medicare Other | Attending: Emergency Medicine | Admitting: Emergency Medicine

## 2016-05-25 ENCOUNTER — Encounter: Payer: Self-pay | Admitting: Emergency Medicine

## 2016-05-25 DIAGNOSIS — Z7984 Long term (current) use of oral hypoglycemic drugs: Secondary | ICD-10-CM | POA: Insufficient documentation

## 2016-05-25 DIAGNOSIS — E7220 Disorder of urea cycle metabolism, unspecified: Secondary | ICD-10-CM | POA: Insufficient documentation

## 2016-05-25 DIAGNOSIS — Z79899 Other long term (current) drug therapy: Secondary | ICD-10-CM | POA: Insufficient documentation

## 2016-05-25 DIAGNOSIS — I119 Hypertensive heart disease without heart failure: Secondary | ICD-10-CM | POA: Insufficient documentation

## 2016-05-25 DIAGNOSIS — R251 Tremor, unspecified: Secondary | ICD-10-CM | POA: Diagnosis present

## 2016-05-25 DIAGNOSIS — J449 Chronic obstructive pulmonary disease, unspecified: Secondary | ICD-10-CM | POA: Insufficient documentation

## 2016-05-25 DIAGNOSIS — E119 Type 2 diabetes mellitus without complications: Secondary | ICD-10-CM | POA: Diagnosis not present

## 2016-05-25 LAB — BASIC METABOLIC PANEL
Anion gap: 4 — ABNORMAL LOW (ref 5–15)
BUN: 11 mg/dL (ref 6–20)
CO2: 25 mmol/L (ref 22–32)
CREATININE: 0.83 mg/dL (ref 0.61–1.24)
Calcium: 8 mg/dL — ABNORMAL LOW (ref 8.9–10.3)
Chloride: 108 mmol/L (ref 101–111)
Glucose, Bld: 243 mg/dL — ABNORMAL HIGH (ref 65–99)
Potassium: 4 mmol/L (ref 3.5–5.1)
SODIUM: 137 mmol/L (ref 135–145)

## 2016-05-25 LAB — CBC
HCT: 29.3 % — ABNORMAL LOW (ref 40.0–52.0)
Hemoglobin: 9.5 g/dL — ABNORMAL LOW (ref 13.0–18.0)
MCH: 26 pg (ref 26.0–34.0)
MCHC: 32.4 g/dL (ref 32.0–36.0)
MCV: 80.4 fL (ref 80.0–100.0)
PLATELETS: 117 10*3/uL — AB (ref 150–440)
RBC: 3.65 MIL/uL — ABNORMAL LOW (ref 4.40–5.90)
RDW: 17.1 % — AB (ref 11.5–14.5)
WBC: 5.4 10*3/uL (ref 3.8–10.6)

## 2016-05-25 LAB — AMMONIA: AMMONIA: 98 umol/L — AB (ref 9–35)

## 2016-05-25 MED ORDER — LACTULOSE 10 GM/15ML PO SOLN
30.0000 g | Freq: Once | ORAL | Status: AC
  Administered 2016-05-25: 30 g via ORAL
  Filled 2016-05-25: qty 60

## 2016-05-25 NOTE — ED Provider Notes (Signed)
[facility] Emergency Department Provider Note   ____________________________________________   First MD Initiated Contact with Patient 05/25/16 [phone]     (approximate)  I have reviewed the triage vital signs and the nursing notes.   HISTORY  Chief Complaint Weakness and Shaking    HPI Howard [name] is a 35 y.o. male With a history of alcoholic cirrhosis and hepatic encephalopathy was presenting to the emergency department today with bilateral shaking in his hands. He denies any pain otherwise. He is here with his mother who says that he does not seem confused. He denies having any falls over the past 2 days. He has been walking with a walker which she is supposed to do at home. The patient and his mother were concerned because they think that his ammonia may be high. They said that he becomes shaky when his ammonia is high.  He says that he has only been taking his lactulose 2-3 times er day and is supposed to be taking it 4 times per day.He says that he also drinks about once a week still despite his cirrhosis.   Past Medical History:  Diagnosis Date  . Alcoholic cirrhosis of liver with ascites (HCC)   . Anemia   . COPD (chronic obstructive pulmonary disease) (HCC)   . Depression   . Diabetes (HCC)   . Diabetes mellitus, type II (HCC)   . Esophageal varices (HCC)   . Heart disease   . Hyperlipemia   . Hypertension   . Liver disease   . Multiple thyroid nodules   . Portal hypertensive gastropathy   . Schizophrenia [facility])     Patient Active Problem List   Diagnosis Date Noted  . Altered mental status 05/12/2016  . Headache 04/29/2016  . OSA (obstructive sleep apnea) 04/29/2016  . Anemia 04/29/2016  . Thrombocytopenia (HCC) 04/29/2016  . Coagulopathy (HCC) 04/29/2016  . Obesity 04/29/2016  . Generalized weakness 04/29/2016  . Acute hepatic encephalopathy (HCC) 03/14/2016  . Controlled type 2 diabetes mellitus without complication (HCC)  01/15/2016  . Normocytic anemia 12/12/2015  . Essential (primary) hypertension 12/11/2015  . Pure hypercholesterolemia 12/11/2015  . Fever 11/11/2015  . Ascites due to alcoholic cirrhosis (HCC) 11/11/2015  . Cirrhosis of liver with ascites (HCC) 11/11/2015  . Hepatic encephalopathy (HCC) 10/16/2015  . Type 2 diabetes mellitus (HCC) 10/16/2015  . Acute renal failure (ARF) (HCC) 07/30/2015  . 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 Alcoholic cirrhosis (HCC) 07/19/2015  . Hypoxia 07/04/2015  . Elevated transaminase level 07/04/2015  . DOE (dyspnea on exertion) 07/04/2015  . Alcohol abuse 05/31/2015  . Hypertension 05/29/2015  . Paranoid schizophrenia (HCC) 03/20/2015    Past Surgical History:  Procedure Laterality Date  . ESOPHAGOGASTRODUODENOSCOPY N/A 10/05/2015   Procedure: ESOPHAGOGASTRODUODENOSCOPY (EGD);  Surgeon: Elnita Maxwell, MD;  Location: [facility] ENDOSCOPY;  Service: Endoscopy;  Laterality: N/A;  . ESOPHAGOGASTRODUODENOSCOPY (EGD) WITH PROPOFOL N/A 08/03/2015   Procedure: ESOPHAGOGASTRODUODENOSCOPY (EGD) WITH PROPOFOL;  Surgeon: Elnita Maxwell, MD;  Location: [facility] ENDOSCOPY;  Service: Endoscopy;  Laterality: N/A;  . ESOPHAGOGASTRODUODENOSCOPY (EGD) WITH PROPOFOL N/A 08/31/2015   Procedure: ESOPHAGOGASTRODUODENOSCOPY (EGD) WITH PROPOFOL;  Surgeon: Elnita Maxwell, MD;  Location: [facility] ENDOSCOPY;  Service: Endoscopy;  Laterality: N/A;  . ESOPHAGOGASTRODUODENOSCOPY (EGD) WITH PROPOFOL N/A 04/04/2016   Procedure: ESOPHAGOGASTRODUODENOSCOPY (EGD) WITH PROPOFOL;  Surgeon: Scot Jun, MD;  Location: [facility] ENDOSCOPY;  Service: Endoscopy;  Laterality: N/A;  . NO PAST SURGERIES      Prior  to Admission medications   Medication Sig Start Date End Date Taking? Authorizing Provider  citalopram (CELEXA) 20 MG tablet Take 20 mg by mouth every morning. 04/08/16  Yes Historical Provider, MD  clobetasol cream (TEMOVATE) 0.05 % Apply 1 application topically 2  (two) times daily. 05/22/16 05/22/17 Yes Historical Provider, MD  furosemide (LASIX) 20 MG tablet Take 1 tablet (20 mg total) by mouth daily. 05/15/16  Yes Richard Renae Gloss, MD  lactulose (CHRONULAC) 10 GM/15ML solution Take 45 mLs (30 g total) by mouth 4 (four) times daily. 05/15/16  Yes Alford Highland, MD  losartan (COZAAR) 100 MG tablet Take 1 tablet (100 mg total) by mouth daily. 05/15/16  Yes Alford Highland, MD  metFORMIN (GLUCOPHAGE) 500 MG tablet Take 1 tablet (500 mg total) by mouth 2 (two) times daily with a meal. 05/15/16  Yes Alford Highland, MD  Multiple Vitamin (MULTIVITAMIN WITH MINERALS) TABS tablet Take 1 tablet by mouth daily. 08/04/15  Yes Ramonita Lab, MD  nadolol (CORGARD) 20 MG tablet Take 20 mg by mouth daily.   Yes Historical Provider, MD  OLANZapine (ZYPREXA) 15 MG tablet Take 2 tablets (30 mg total) by mouth at bedtime. 05/22/16  Yes Audery Amel, MD  omeprazole (PRILOSEC) 40 MG capsule Take 1 capsule (40 mg total) by mouth every morning. 02/12/16  Yes Enid Baas, MD  ondansetron (ZOFRAN) 4 MG tablet Take 4 mg by mouth every 8 (eight) hours as needed for nausea.   Yes Historical Provider, MD  rifaximin (XIFAXAN) 550 MG TABS tablet Take 1 tablet (550 mg total) by mouth 2 (two) times daily. 05/15/16  Yes Alford Highland, MD  spironolactone (ALDACTONE) 25 MG tablet Take 1 tablet (25 mg total) by mouth daily. 05/15/16  Yes Richard Renae Gloss, MD  sucralfate (CARAFATE) 1 g tablet Take 1 g by mouth 4 (four) times daily.   Yes Historical Provider, MD    Allergies Review of patient's allergies indicates no known allergies.  Family History  Problem Relation Age of Onset  . Heart disease Mother   . Hypertension Mother   . Hyperlipidemia Mother   . Stroke Father   . Heart attack Father   . Hypertension Father   . Heart disease Father   . Alcohol abuse Father   . Heart disease Brother     Social History Social History  Substance Use Topics  . Smoking status: Never Smoker    . Smokeless tobacco: Never Used  . Alcohol use 48.0 oz/week    80 Cans of beer per week     Comment: last drink 04/19/16    Review of Systems Constitutional: No fever/chills Eyes: No visual changes. ENT: No sore throat. Cardiovascular: Denies chest pain. Respiratory: Denies shortness of breath. Gastrointestinal: No abdominal pain.  No nausea, no vomiting.  No diarrhea.  No constipation. Genitourinary: Negative for dysuria. Musculoskeletal: Negative for back pain. Skin: Negative for rash. Neurological: Negative for headaches, focal weakness or numbness.  10-point ROS otherwise negative.  ____________________________________________   PHYSICAL EXAM:  VITAL SIGNS: ED Triage Vitals  Enc Vitals Group     BP 05/25/16 1524 130/74     Pulse Rate 05/25/16 1524 94     Resp 05/25/16 1524 18     Temp 05/25/16 1524 99.1 F (37.3 C)     Temp Source 05/25/16 1524 Oral     SpO2 05/25/16 1524 96 %     Weight 05/25/16 1525 (!) 340 lb (154.2 kg)     Height 05/25/16 1525 6\' 3"  (1.905  m)     Head Circumference --      Peak Flow --      Pain Score 05/25/16 1525 0     Pain Loc --      Pain Edu? --      Excl. in GC? --     Constitutional: Alert and oriented. Well appearing and in no acute distress. Eyes: Conjunctivae are normal. PERRL. EOMI. Head: Atraumatic. Nose: No congestion/rhinnorhea. Mouth/Throat: Mucous membranes are moist.   Neck: No stridor.   Cardiovascular: Normal rate, regular rhythm. Grossly normal heart sounds.   Respiratory: Normal respiratory effort.  No retractions. Lungs CTAB. Gastrointestinal: Soft and nontender. No distention.  Musculoskeletal: No lower extremity tenderness nor edema.  No joint effusions. Neurologic:  Normal speech and language. No gross focal neurologic deficits are appreciated. No gait instability. Walks with a walker without any assistance and is able to traverse the exam room well without any tripping or appearing as if he is going to fall. No  asterixis. Mild tremor which the ptient says is consistent with when his ammonia becomes elevated. Skin:  Skin is warm, dry and intact. No rash noted. Psychiatric: Mood and affect are normal. Speech and behavior are normal.  ____________________________________________   LABS (all labs ordered are listed, but only abnormal results are displayed)  Labs Reviewed  BASIC METABOLIC PANEL - Abnormal; Notable for the following:       Result Value   Glucose, Bld 243 (*)    Calcium 8.0 (*)    Anion gap 4 (*)    All other components within normal limits  CBC - Abnormal; Notable for the following:    RBC 3.65 (*)    Hemoglobin 9.5 (*)    HCT 29.3 (*)    RDW 17.1 (*)    Platelets 117 (*)    All other components within normal limits  AMMONIA - Abnormal; Notable for the following:    Ammonia 98 (*)    All other components within normal limits  URINALYSIS COMPLETEWITH MICROSCOPIC ([facility] ONLY)  CBG MONITORING, ED   ____________________________________________  EKG  ED ECG REPORT I, Arelia LongestSchaevitz,  Saundra Gin M, the attending physician, personally viewed and interpreted this ECG.   Date: 05/25/2016  EKG Time: 1535  Rate: 87  Rhythm: normal sinus rhythm  Axis: normal  Intervals:none  ST&T Change: No ST segment elevation or depression. No abnormal T-wave inversion.  ____________________________________________  RADIOLOGY   ____________________________________________   PROCEDURES  Procedure(s) performed:   Procedures  Critical Care performed:   ____________________________________________   INITIAL IMPRESSION / ASSESSMENT AND PLAN / ED COURSE  Pertinent labs & imaging results that were available during my care of the patient were reviewed by me and considered in my medical decision making (see chart for details).  Patient with an elevated ammonia at 97 but has an intact mentation. He does have a mild amount of tremulousness his bilateral handsbut no asterixis.He is able to   Ambulate at his baseline with his walker.  I discussed the lab results as well as the possibility for admission with the patient and his mother. However, they would prefer to have a dose of lactulose here and be discharged home. I feel that this is completely reasonable since the patient's mentation is intact and he is able to ambulate. I feel that the  Patient's presentation is primarily a result of missing multiple doses of his lactulose. I discussed this with him as well as his mother. They said that they will be  vigilant about taking these medications as prescribed. The mother also says that she will be overseeing the patient's lactulose administration so that he is sure to have it 4 times a day.They're aware of return precautions and will return for any worsening or concerning symptoms especially consistent with his apparent encephalopathy.  Clinical Course     ____________________________________________   FINAL CLINICAL IMPRESSION(S) / ED DIAGNOSES  hyperammonemia    NEW MEDICATIONS STARTED DURING THIS VISIT:  New Prescriptions   No medications on file     Note:  This document was prepared using Dragon voice recognition software and may include unintentional dictation errors.    Myrna Blazer, MD 05/25/16 [phone]

## 2016-05-25 NOTE — ED Notes (Signed)
Pt states he forgets to take his lactulose and his mother forgets to remind him. Did take 2 doses today with his next dose due at 5pm.

## 2016-05-25 NOTE — ED Triage Notes (Signed)
Pt presents to ED with c/o feeling like ammonia level is high. Pt states he takes Lactulose at home four times a day but has not been taking it as prescribed. Pt states he takes it at least once a day.  Pt states he feels weak when his ammonia is high. Pt states he noticed the weakness starting yesterday. Pt states he also starts to shake in his hands.  Pt is alert and orient. Using 2L via [place] at this time from home.

## 2016-05-28 ENCOUNTER — Telehealth: Payer: Self-pay

## 2016-05-28 NOTE — Telephone Encounter (Signed)
received fax requesting a prior authorization for olanzapine

## 2016-05-28 NOTE — Telephone Encounter (Signed)
received notice that prior auth was approved effective date  09-23-15 to 09-21-16

## 2016-05-28 NOTE — Telephone Encounter (Signed)
did prior auth online at [email or web address].  pa pending.

## 2016-06-01 ENCOUNTER — Emergency Department
Admission: EM | Admit: 2016-06-01 | Discharge: 2016-06-01 | Disposition: A | Discharge status: Home / Self Care | Payer: Medicare Other | Attending: Emergency Medicine | Admitting: Emergency Medicine

## 2016-06-01 ENCOUNTER — Encounter: Payer: Self-pay | Admitting: Emergency Medicine

## 2016-06-01 DIAGNOSIS — K7210 Chronic hepatic failure without coma: Secondary | ICD-10-CM | POA: Insufficient documentation

## 2016-06-01 DIAGNOSIS — R7989 Other specified abnormal findings of blood chemistry: Secondary | ICD-10-CM

## 2016-06-01 DIAGNOSIS — Z7984 Long term (current) use of oral hypoglycemic drugs: Secondary | ICD-10-CM | POA: Insufficient documentation

## 2016-06-01 DIAGNOSIS — E119 Type 2 diabetes mellitus without complications: Secondary | ICD-10-CM | POA: Insufficient documentation

## 2016-06-01 DIAGNOSIS — I10 Essential (primary) hypertension: Secondary | ICD-10-CM | POA: Diagnosis not present

## 2016-06-01 DIAGNOSIS — Z79899 Other long term (current) drug therapy: Secondary | ICD-10-CM | POA: Diagnosis not present

## 2016-06-01 DIAGNOSIS — J449 Chronic obstructive pulmonary disease, unspecified: Secondary | ICD-10-CM | POA: Diagnosis not present

## 2016-06-01 DIAGNOSIS — E7220 Disorder of urea cycle metabolism, unspecified: Secondary | ICD-10-CM | POA: Diagnosis present

## 2016-06-01 LAB — COMPREHENSIVE METABOLIC PANEL
ALBUMIN: 3.3 g/dL — AB (ref 3.5–5.0)
ALT: 32 U/L (ref 17–63)
AST: 40 U/L (ref 15–41)
Alkaline Phosphatase: 124 U/L (ref 38–126)
Anion gap: 4 — ABNORMAL LOW (ref 5–15)
BILIRUBIN TOTAL: 0.7 mg/dL (ref 0.3–1.2)
BUN: 10 mg/dL (ref 6–20)
CO2: 29 mmol/L (ref 22–32)
Calcium: 8.4 mg/dL — ABNORMAL LOW (ref 8.9–10.3)
Chloride: 105 mmol/L (ref 101–111)
Creatinine, Ser: 0.7 mg/dL (ref 0.61–1.24)
GFR calc Af Amer: >60 mL/min (ref >60)
GFR calc non Af Amer: >60 mL/min (ref >60)
GLUCOSE: 247 mg/dL — AB (ref 65–99)
POTASSIUM: 3.7 mmol/L (ref 3.5–5.1)
Sodium: 138 mmol/L (ref 135–145)
TOTAL PROTEIN: 7.6 g/dL (ref 6.5–8.1)

## 2016-06-01 LAB — URINALYSIS COMPLETE WITH MICROSCOPIC (ARMC ONLY)
BACTERIA UA: NONE SEEN
Bilirubin Urine: NEGATIVE
Glucose, UA: NEGATIVE mg/dL
Ketones, ur: NEGATIVE mg/dL
Leukocytes, UA: NEGATIVE
NITRITE: NEGATIVE
Protein, ur: NEGATIVE mg/dL
Specific Gravity, Urine: 1.013 (ref 1.005–1.030)
pH: 6 (ref 5.0–8.0)

## 2016-06-01 LAB — PROTIME-INR
INR: 1.18
PROTHROMBIN TIME: 15.1 s (ref 11.4–15.2)

## 2016-06-01 LAB — CBC
HEMATOCRIT: 29.3 % — AB (ref 40.0–52.0)
Hemoglobin: 9.4 g/dL — ABNORMAL LOW (ref 13.0–18.0)
MCH: 25.7 pg — ABNORMAL LOW (ref 26.0–34.0)
MCHC: 32 g/dL (ref 32.0–36.0)
MCV: 80.3 fL (ref 80.0–100.0)
Platelets: 118 10*3/uL — ABNORMAL LOW (ref 150–440)
RBC: 3.66 MIL/uL — ABNORMAL LOW (ref 4.40–5.90)
RDW: 17.2 % — AB (ref 11.5–14.5)
WBC: 5.4 10*3/uL (ref 3.8–10.6)

## 2016-06-01 LAB — LIPASE, BLOOD: Lipase: 31 U/L (ref 11–51)

## 2016-06-01 LAB — AMMONIA: AMMONIA: 76 umol/L — AB (ref 9–35)

## 2016-06-01 MED ORDER — LACTULOSE 10 GM/15ML PO SOLN
30.0000 g | Freq: Once | ORAL | Status: AC
  Administered 2016-06-01: 30 g via ORAL
  Filled 2016-06-01: qty 60

## 2016-06-01 NOTE — Discharge Instructions (Signed)
Please seek medical attention for any high fevers, chest pain, shortness of breath, change in behavior, persistent vomiting, bloody stool or any other new or concerning symptoms.  

## 2016-06-01 NOTE — ED Triage Notes (Signed)
Pt presents to ED via POV. Pt states was seen by home health nurse yesterday and was told by the home health nurse that his ammonia level was too high. Pt also c/o weakness and feeling like he has too much fluid. Pt's mother states that last time pt was seen he had an elevated ammonia level and was D/C after being given lactulose. Pt states he feels SHOB and that his stomach is distended due to fluid overload. Pt is on chronic 2L via [place]. Pt c/o tremors when his hands are extended but states "they don't shake when I got a hold of something". Pt is alert and oriented, denies N/V/D, denies alteration in mental status at this time.

## 2016-06-01 NOTE — ED Provider Notes (Signed)
[facility] Emergency Department Provider Note  ____________________________________________   I have reviewed the triage vital signs and the nursing notes.   HISTORY  Chief Complaint Other (told by home health his ammonia level was high, and fluid overload.)   History limited by: Not Limited   HPI Howard [name] is a 35 y.o. male with history of liver failure, multiple visits to the emergency department, who presents to the emergency department today because home health nurse concerned that his ammonia level might be high. Patient does have a history of elevated ammonia in the past. The patient stated that he did feel little confused the other day, he tells a story about driving his car and taking that his destination was on 1 reviewed when it was actually on another road. The patient additionally is concerned that he has fluid on his stomach because his stomach measurements are increasing. He denies any fevers. No abdominal pain.    Past Medical History:  Diagnosis Date  . Alcoholic cirrhosis of liver with ascites (HCC)   . Anemia   . COPD (chronic obstructive pulmonary disease) (HCC)   . Depression   . Diabetes (HCC)   . Diabetes mellitus, type II (HCC)   . Esophageal varices (HCC)   . Heart disease   . Hyperlipemia   . Hypertension   . Liver disease   . Multiple thyroid nodules   . Portal hypertensive gastropathy   . Schizophrenia [facility](HCC)     Patient Active Problem List   Diagnosis Date Noted  . Altered mental status 05/12/2016  . Headache 04/29/2016  . OSA (obstructive sleep apnea) 04/29/2016  . Anemia 04/29/2016  . Thrombocytopenia (HCC) 04/29/2016  . Coagulopathy (HCC) 04/29/2016  . Obesity 04/29/2016  . Generalized weakness 04/29/2016  . Acute hepatic encephalopathy (HCC) 03/14/2016  . Controlled type 2 diabetes mellitus without complication (HCC) 01/15/2016  . Normocytic anemia 12/12/2015  . Essential (primary) hypertension 12/11/2015   . Pure hypercholesterolemia 12/11/2015  . Fever 11/11/2015  . Ascites due to alcoholic cirrhosis (HCC) 11/11/2015  . Cirrhosis of liver with ascites (HCC) 11/11/2015  . Hepatic encephalopathy (HCC) 10/16/2015  . Type 2 diabetes mellitus (HCC) 10/16/2015  . Acute renal failure (ARF) (HCC) 07/30/2015  . 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 Alcoholic cirrhosis (HCC) 07/19/2015  . Hypoxia 07/04/2015  . Elevated transaminase level 07/04/2015  . DOE (dyspnea on exertion) 07/04/2015  . Alcohol abuse 05/31/2015  . Hypertension 05/29/2015  . Paranoid schizophrenia (HCC) 03/20/2015    Past Surgical History:  Procedure Laterality Date  . ESOPHAGOGASTRODUODENOSCOPY N/A 10/05/2015   Procedure: ESOPHAGOGASTRODUODENOSCOPY (EGD);  Surgeon: Elnita MaxwellMatthew Gordon Rein, MD;  [facility];  Service: Endoscopy;  Laterality: N/A;  . ESOPHAGOGASTRODUODENOSCOPY (EGD) WITH PROPOFOL N/A 08/03/2015   Procedure: ESOPHAGOGASTRODUODENOSCOPY (EGD) WITH PROPOFOL;  Surgeon: Elnita MaxwellMatthew Gordon Rein, MD;  [facility];  Service: Endoscopy;  Laterality: N/A;  . ESOPHAGOGASTRODUODENOSCOPY (EGD) WITH PROPOFOL N/A 08/31/2015   Procedure: ESOPHAGOGASTRODUODENOSCOPY (EGD) WITH PROPOFOL;  Surgeon: Elnita MaxwellMatthew Gordon Rein, MD;  [facility];  Service: Endoscopy;  Laterality: N/A;  . ESOPHAGOGASTRODUODENOSCOPY (EGD) WITH PROPOFOL N/A 04/04/2016   Procedure: ESOPHAGOGASTRODUODENOSCOPY (EGD) WITH PROPOFOL;  Surgeon: Scot Junobert T Elliott, MD;  [facility];  Service: Endoscopy;  Laterality: N/A;  . NO PAST SURGERIES      Prior to Admission medications   Medication Sig Start Date End Date Taking? Authorizing Provider  citalopram (CELEXA) 20 MG tablet Take 20 mg by mouth every morning. 04/08/16  Historical Provider, MD  clobetasol cream (TEMOVATE) 0.05 % Apply 1 application topically 2 (two) times daily. 05/22/16 05/22/17  Historical Provider, MD  furosemide (LASIX) 20 MG tablet  Take 1 tablet (20 mg total) by mouth daily. 05/15/16   Alford Highland, MD  lactulose (CHRONULAC) 10 GM/15ML solution Take 45 mLs (30 g total) by mouth 4 (four) times daily. 05/15/16   Alford Highland, MD  losartan (COZAAR) 100 MG tablet Take 1 tablet (100 mg total) by mouth daily. 05/15/16   Alford Highland, MD  metFORMIN (GLUCOPHAGE) 500 MG tablet Take 1 tablet (500 mg total) by mouth 2 (two) times daily with a meal. 05/15/16   Alford Highland, MD  Multiple Vitamin (MULTIVITAMIN WITH MINERALS) TABS tablet Take 1 tablet by mouth daily. 08/04/15   Ramonita Lab, MD  nadolol (CORGARD) 20 MG tablet Take 20 mg by mouth daily.    Historical Provider, MD  OLANZapine (ZYPREXA) 15 MG tablet Take 2 tablets (30 mg total) by mouth at bedtime. 05/22/16   Audery Amel, MD  omeprazole (PRILOSEC) 40 MG capsule Take 1 capsule (40 mg total) by mouth every morning. 02/12/16   Enid Baas, MD  ondansetron (ZOFRAN) 4 MG tablet Take 4 mg by mouth every 8 (eight) hours as needed for nausea.    Historical Provider, MD  rifaximin (XIFAXAN) 550 MG TABS tablet Take 1 tablet (550 mg total) by mouth 2 (two) times daily. 05/15/16   Alford Highland, MD  spironolactone (ALDACTONE) 25 MG tablet Take 1 tablet (25 mg total) by mouth daily. 05/15/16   Alford Highland, MD  sucralfate (CARAFATE) 1 g tablet Take 1 g by mouth 4 (four) times daily.    Historical Provider, MD    Allergies Review of patient's allergies indicates no known allergies.  Family History  Problem Relation Age of Onset  . Heart disease Mother   . Hypertension Mother   . Hyperlipidemia Mother   . Stroke Father   . Heart attack Father   . Hypertension Father   . Heart disease Father   . Alcohol abuse Father   . Heart disease Brother     Social History Social History  Substance Use Topics  . Smoking status: Never Smoker  . Smokeless tobacco: Never Used  . Alcohol use 48.0 oz/week    80 Cans of beer per week     Comment: last drink 04/19/16     Review of Systems  Constitutional: Negative for fever. Cardiovascular: Negative for chest pain. Respiratory: Negative for shortness of breath. Gastrointestinal: Negative for abdominal pain, vomiting and diarrhea. Genitourinary: Negative for dysuria. Musculoskeletal: Negative for back pain. Skin: Negative for rash. Neurological: Negative for headaches, focal weakness or numbness.  10-point ROS otherwise negative.  ____________________________________________   PHYSICAL EXAM:  VITAL SIGNS: ED Triage Vitals [06/01/16 1231]  Enc Vitals Group     BP 138/84     Pulse Rate 91     Resp 20     Temp 98.4 F (36.9 C)     Temp Source Oral     SpO2 95 %     Weight (!) 340 lb (154.2 kg)     Height 6\' 3"  (1.905 m)   Constitutional: Alert and oriented. Well appearing and in no distress. Eyes: Conjunctivae are normal. Normal extraocular movements. ENT   Head: Normocephalic and atraumatic.   Nose: No congestion/rhinnorhea.   Mouth/Throat: Mucous membranes are moist.   Neck: No stridor. Hematological/Lymphatic/Immunilogical: No cervical lymphadenopathy. Cardiovascular: Normal rate, regular rhythm.  No murmurs,  rubs, or gallops. Respiratory: Normal respiratory effort without tachypnea nor retractions. Breath sounds are clear and equal bilaterally. No wheezes/rales/rhonchi. Gastrointestinal: Soft and nontender. No distention. Bedside ultrasound did not show any ascitic fluid. Genitourinary: Deferred Musculoskeletal: Normal range of motion in all extremities. No lower extremity edema. Neurologic:  Normal speech and language. No gross focal neurologic deficits are appreciated. No asterixis Skin:  Skin is warm, dry and intact. No rash noted. Psychiatric: Mood and affect are normal. Speech and behavior are normal. Patient exhibits appropriate insight and judgment.  ____________________________________________    LABS (pertinent positives/negatives)  Labs Reviewed   COMPREHENSIVE METABOLIC PANEL - Abnormal; Notable for the following:       Result Value   Glucose, Bld 247 (*)    Calcium 8.4 (*)    Albumin 3.3 (*)    Anion gap 4 (*)    All other components within normal limits  CBC - Abnormal; Notable for the following:    RBC 3.66 (*)    Hemoglobin 9.4 (*)    HCT 29.3 (*)    MCH 25.7 (*)    RDW 17.2 (*)    Platelets 118 (*)    All other components within normal limits  URINALYSIS COMPLETEWITH MICROSCOPIC ([facility] ONLY) - Abnormal; Notable for the following:    Color, Urine YELLOW (*)    APPearance CLEAR (*)    Hgb urine dipstick 3+ (*)    Squamous Epithelial / LPF 0-5 (*)    All other components within normal limits  AMMONIA - Abnormal; Notable for the following:    Ammonia 76 (*)    All other components within normal limits  LIPASE, BLOOD  PROTIME-INR     ____________________________________________   EKG  I, Phineas Semen, attending physician, personally viewed and interpreted this EKG  EKG Time: 1232 Rate: 88 Rhythm: normal sinus rhythm Axis: normal Intervals: qtc 467 QRS: narrow ST changes: no st elevation Impression: normal ekg   ____________________________________________    RADIOLOGY  None  ____________________________________________   PROCEDURES  Procedures  ____________________________________________   INITIAL IMPRESSION / ASSESSMENT AND PLAN / ED COURSE  Pertinent labs & imaging results that were available during my care of the patient were reviewed by me and considered in my medical decision making (see chart for details).  Patient with history of liver failure who presents to the emergency department today because of concerns for possible elevated ammonia as well as increasing abdominal distention. Monoxide ultrasound did not show any ascites. Patient's ammonia level was elevated however appears to be more or less patient's baseline. I did have a discussion with the patient about his use of  lactulose. It does not appear that the patient is taking it regularly or as prescribed. He states that he forgets to take it a lot of times. This point I do not think patient has any encephalopathic signs. Will give him a dose of lactulose here. Additionally discussed with him about sending alarms on his phone reminded to takes lactulose.   ____________________________________________   FINAL CLINICAL IMPRESSION(S) / ED DIAGNOSES  Final diagnoses:  Chronic liver failure without hepatic coma (HCC)  Increased ammonia level     Note: This dictation was prepared with Dragon dictation. Any transcriptional errors that result from this process are unintentional    Phineas Semen, MD 06/01/16 1513

## 2016-06-01 NOTE — ED Notes (Signed)
MD at bedside. 

## 2016-06-09 ENCOUNTER — Other Ambulatory Visit: Payer: Self-pay | Admitting: Physician Assistant

## 2016-06-09 ENCOUNTER — Inpatient Hospital Stay
Admission: EM | Admit: 2016-06-09 | Discharge: 2016-06-11 | DRG: 101 | Disposition: A | Discharge status: Home / Self Care | Payer: Medicare Other | Attending: Internal Medicine | Admitting: Internal Medicine

## 2016-06-09 ENCOUNTER — Emergency Department: Payer: Medicare Other

## 2016-06-09 ENCOUNTER — Encounter: Payer: Self-pay | Admitting: Emergency Medicine

## 2016-06-09 ENCOUNTER — Ambulatory Visit
Admission: RE | Admit: 2016-06-09 | Discharge: 2016-06-09 | Disposition: A | Discharge status: Home / Self Care | Payer: Medicare Other | Source: Ambulatory Visit | Attending: Physician Assistant | Admitting: Physician Assistant

## 2016-06-09 ENCOUNTER — Other Ambulatory Visit: Payer: Self-pay

## 2016-06-09 DIAGNOSIS — Z823 Family history of stroke: Secondary | ICD-10-CM

## 2016-06-09 DIAGNOSIS — G4089 Other seizures: Secondary | ICD-10-CM | POA: Diagnosis not present

## 2016-06-09 DIAGNOSIS — K7290 Hepatic failure, unspecified without coma: Secondary | ICD-10-CM | POA: Diagnosis present

## 2016-06-09 DIAGNOSIS — G40409 Other generalized epilepsy and epileptic syndromes, not intractable, without status epilepticus: Secondary | ICD-10-CM

## 2016-06-09 DIAGNOSIS — Z79899 Other long term (current) drug therapy: Secondary | ICD-10-CM

## 2016-06-09 DIAGNOSIS — R569 Unspecified convulsions: Secondary | ICD-10-CM

## 2016-06-09 DIAGNOSIS — E119 Type 2 diabetes mellitus without complications: Secondary | ICD-10-CM | POA: Diagnosis present

## 2016-06-09 DIAGNOSIS — J449 Chronic obstructive pulmonary disease, unspecified: Secondary | ICD-10-CM | POA: Diagnosis present

## 2016-06-09 DIAGNOSIS — I10 Essential (primary) hypertension: Secondary | ICD-10-CM | POA: Diagnosis present

## 2016-06-09 DIAGNOSIS — F209 Schizophrenia, unspecified: Secondary | ICD-10-CM | POA: Diagnosis present

## 2016-06-09 DIAGNOSIS — Z8249 Family history of ischemic heart disease and other diseases of the circulatory system: Secondary | ICD-10-CM

## 2016-06-09 DIAGNOSIS — F329 Major depressive disorder, single episode, unspecified: Secondary | ICD-10-CM | POA: Diagnosis present

## 2016-06-09 DIAGNOSIS — F1020 Alcohol dependence, uncomplicated: Secondary | ICD-10-CM | POA: Diagnosis present

## 2016-06-09 DIAGNOSIS — Z7984 Long term (current) use of oral hypoglycemic drugs: Secondary | ICD-10-CM

## 2016-06-09 DIAGNOSIS — K766 Portal hypertension: Secondary | ICD-10-CM | POA: Diagnosis present

## 2016-06-09 DIAGNOSIS — E785 Hyperlipidemia, unspecified: Secondary | ICD-10-CM | POA: Diagnosis present

## 2016-06-09 DIAGNOSIS — K7031 Alcoholic cirrhosis of liver with ascites: Secondary | ICD-10-CM | POA: Diagnosis present

## 2016-06-09 LAB — ETHANOL

## 2016-06-09 LAB — COMPREHENSIVE METABOLIC PANEL
ALBUMIN: 3.3 g/dL — AB (ref 3.5–5.0)
ALK PHOS: 102 U/L (ref 38–126)
ALT: 27 U/L (ref 17–63)
ANION GAP: 5 (ref 5–15)
AST: 37 U/L (ref 15–41)
BUN: 13 mg/dL (ref 6–20)
CALCIUM: 8.2 mg/dL — AB (ref 8.9–10.3)
CO2: 28 mmol/L (ref 22–32)
Chloride: 105 mmol/L (ref 101–111)
Creatinine, Ser: 0.79 mg/dL (ref 0.61–1.24)
GFR calc non Af Amer: >60 mL/min (ref >60)
GLUCOSE: 214 mg/dL — AB (ref 65–99)
POTASSIUM: 3.5 mmol/L (ref 3.5–5.1)
SODIUM: 138 mmol/L (ref 135–145)
TOTAL PROTEIN: 7.6 g/dL (ref 6.5–8.1)
Total Bilirubin: 0.8 mg/dL (ref 0.3–1.2)

## 2016-06-09 LAB — CBC WITH DIFFERENTIAL/PLATELET
BASOS PCT: 1 %
Basophils Absolute: 0.1 10*3/uL (ref 0–0.1)
EOS ABS: 0.2 10*3/uL (ref 0–0.7)
EOS PCT: 3 %
HCT: 28.1 % — ABNORMAL LOW (ref 40.0–52.0)
HEMOGLOBIN: 8.9 g/dL — AB (ref 13.0–18.0)
LYMPHS PCT: 18 %
Lymphs Abs: 1.2 10*3/uL (ref 1.0–3.6)
MCH: 25 pg — AB (ref 26.0–34.0)
MCHC: 31.7 g/dL — AB (ref 32.0–36.0)
MCV: 79 fL — ABNORMAL LOW (ref 80.0–100.0)
MONO ABS: 0.6 10*3/uL (ref 0.2–1.0)
MONOS PCT: 9 %
NEUTROS ABS: 4.5 10*3/uL (ref 1.4–6.5)
NEUTROS PCT: 69 %
Platelets: 114 10*3/uL — ABNORMAL LOW (ref 150–440)
RBC: 3.56 MIL/uL — ABNORMAL LOW (ref 4.40–5.90)
RDW: 17 % — AB (ref 11.5–14.5)
WBC: 6.6 10*3/uL (ref 3.8–10.6)

## 2016-06-09 LAB — APTT: aPTT: 34 seconds (ref 24–36)

## 2016-06-09 LAB — AMMONIA: Ammonia: 90 umol/L — ABNORMAL HIGH (ref 9–35)

## 2016-06-09 LAB — PROTIME-INR
INR: 1.3
PROTHROMBIN TIME: 16.3 s — AB (ref 11.4–15.2)

## 2016-06-09 NOTE — ED Triage Notes (Signed)
Pt presents to ED with his mother after he had a seizure about an hour ago that lasted approx 30 seconds. Pt mom states he had been sitting on a chair and he suddenly developed a severe headache and got up to get water. shortly after getting up her fell to the floor and began having seizure like acitivity. Not witnessed. Pt mom heard him fall. Unsure if he hit his head on the linoleum flooring. Pt mom reports pt acted normally after the seizure and was not incontinent of urine. Pt reports he had seizure like activity on Sunday that lasted approx 30 seconds and it was witnessed by his mom. Pt states he has no previous hx of the same.

## 2016-06-10 ENCOUNTER — Inpatient Hospital Stay: Payer: Medicare Other

## 2016-06-10 ENCOUNTER — Emergency Department: Payer: Medicare Other

## 2016-06-10 DIAGNOSIS — Z8249 Family history of ischemic heart disease and other diseases of the circulatory system: Secondary | ICD-10-CM | POA: Diagnosis not present

## 2016-06-10 DIAGNOSIS — E119 Type 2 diabetes mellitus without complications: Secondary | ICD-10-CM | POA: Diagnosis present

## 2016-06-10 DIAGNOSIS — G4089 Other seizures: Secondary | ICD-10-CM | POA: Diagnosis present

## 2016-06-10 DIAGNOSIS — Z7984 Long term (current) use of oral hypoglycemic drugs: Secondary | ICD-10-CM | POA: Diagnosis not present

## 2016-06-10 DIAGNOSIS — R569 Unspecified convulsions: Secondary | ICD-10-CM | POA: Diagnosis not present

## 2016-06-10 DIAGNOSIS — E785 Hyperlipidemia, unspecified: Secondary | ICD-10-CM | POA: Diagnosis present

## 2016-06-10 DIAGNOSIS — K7290 Hepatic failure, unspecified without coma: Secondary | ICD-10-CM | POA: Diagnosis present

## 2016-06-10 DIAGNOSIS — K7031 Alcoholic cirrhosis of liver with ascites: Secondary | ICD-10-CM | POA: Diagnosis present

## 2016-06-10 DIAGNOSIS — K766 Portal hypertension: Secondary | ICD-10-CM | POA: Diagnosis present

## 2016-06-10 DIAGNOSIS — F209 Schizophrenia, unspecified: Secondary | ICD-10-CM | POA: Diagnosis present

## 2016-06-10 DIAGNOSIS — J449 Chronic obstructive pulmonary disease, unspecified: Secondary | ICD-10-CM | POA: Diagnosis present

## 2016-06-10 DIAGNOSIS — Z79899 Other long term (current) drug therapy: Secondary | ICD-10-CM | POA: Diagnosis not present

## 2016-06-10 DIAGNOSIS — I10 Essential (primary) hypertension: Secondary | ICD-10-CM | POA: Diagnosis present

## 2016-06-10 DIAGNOSIS — Z823 Family history of stroke: Secondary | ICD-10-CM | POA: Diagnosis not present

## 2016-06-10 DIAGNOSIS — F1020 Alcohol dependence, uncomplicated: Secondary | ICD-10-CM | POA: Diagnosis present

## 2016-06-10 DIAGNOSIS — F329 Major depressive disorder, single episode, unspecified: Secondary | ICD-10-CM | POA: Diagnosis present

## 2016-06-10 LAB — URINALYSIS COMPLETE WITH MICROSCOPIC (ARMC ONLY)
Bacteria, UA: NONE SEEN
Bilirubin Urine: NEGATIVE
Glucose, UA: NEGATIVE mg/dL
Ketones, ur: NEGATIVE mg/dL
Leukocytes, UA: NEGATIVE
Nitrite: NEGATIVE
PH: 7 (ref 5.0–8.0)
PROTEIN: 30 mg/dL — AB
SPECIFIC GRAVITY, URINE: 1.025 (ref 1.005–1.030)

## 2016-06-10 LAB — CBC
HCT: 27.8 % — ABNORMAL LOW (ref 40.0–52.0)
Hemoglobin: 9 g/dL — ABNORMAL LOW (ref 13.0–18.0)
MCH: 25.2 pg — AB (ref 26.0–34.0)
MCHC: 32.2 g/dL (ref 32.0–36.0)
MCV: 78.2 fL — AB (ref 80.0–100.0)
PLATELETS: 101 10*3/uL — AB (ref 150–440)
RBC: 3.56 MIL/uL — ABNORMAL LOW (ref 4.40–5.90)
RDW: 17 % — AB (ref 11.5–14.5)
WBC: 5.4 10*3/uL (ref 3.8–10.6)

## 2016-06-10 LAB — GLUCOSE, CAPILLARY
GLUCOSE-CAPILLARY: 112 mg/dL — AB (ref 65–99)
GLUCOSE-CAPILLARY: 110 mg/dL — AB (ref 65–99)
Glucose-Capillary: 123 mg/dL — ABNORMAL HIGH (ref 65–99)

## 2016-06-10 LAB — BASIC METABOLIC PANEL
Anion gap: 4 — ABNORMAL LOW (ref 5–15)
BUN: 12 mg/dL (ref 6–20)
CHLORIDE: 106 mmol/L (ref 101–111)
CO2: 30 mmol/L (ref 22–32)
CREATININE: 0.69 mg/dL (ref 0.61–1.24)
Calcium: 8.2 mg/dL — ABNORMAL LOW (ref 8.9–10.3)
GFR calc Af Amer: >60 mL/min (ref >60)
GFR calc non Af Amer: >60 mL/min (ref >60)
GLUCOSE: 128 mg/dL — AB (ref 65–99)
Potassium: 3.5 mmol/L (ref 3.5–5.1)
Sodium: 140 mmol/L (ref 135–145)

## 2016-06-10 LAB — POCT CBG MONITORING: POCT Glucose (KUC): 123 mg/dL — AB (ref 70–99)

## 2016-06-10 LAB — PHOSPHORUS: Phosphorus: 3.1 mg/dL (ref 2.5–4.6)

## 2016-06-10 LAB — MAGNESIUM: Magnesium: 1.9 mg/dL (ref 1.7–2.4)

## 2016-06-10 MED ORDER — SODIUM CHLORIDE 0.9 % IV SOLN
1000.0000 mg | Freq: Once | INTRAVENOUS | Status: AC
  Administered 2016-06-10: 1000 mg via INTRAVENOUS
  Filled 2016-06-10: qty 10

## 2016-06-10 MED ORDER — CLOBETASOL PROPIONATE 0.05 % EX CREA
1.0000 "application " | TOPICAL_CREAM | Freq: Two times a day (BID) | CUTANEOUS | Status: DC
  Administered 2016-06-10 – 2016-06-11 (×2): 1 via TOPICAL
  Filled 2016-06-10: qty 15

## 2016-06-10 MED ORDER — RIFAXIMIN 550 MG PO TABS
550.0000 mg | ORAL_TABLET | Freq: Two times a day (BID) | ORAL | Status: DC
  Administered 2016-06-10 – 2016-06-11 (×3): 550 mg via ORAL
  Filled 2016-06-10 (×3): qty 1

## 2016-06-10 MED ORDER — ACETAMINOPHEN 650 MG RE SUPP: 650.0000 mg | Freq: Four times a day (QID) | RECTAL | Status: DC | PRN

## 2016-06-10 MED ORDER — LEVETIRACETAM 500 MG PO TABS
500.0000 mg | ORAL_TABLET | Freq: Two times a day (BID) | ORAL | Status: DC
  Administered 2016-06-10 – 2016-06-11 (×2): 500 mg via ORAL
  Filled 2016-06-10 (×2): qty 1

## 2016-06-10 MED ORDER — ONDANSETRON HCL 4 MG/2ML IJ SOLN: 4.0000 mg | Freq: Four times a day (QID) | INTRAMUSCULAR | Status: DC | PRN

## 2016-06-10 MED ORDER — SPIRONOLACTONE 25 MG PO TABS
25.0000 mg | ORAL_TABLET | Freq: Every day | ORAL | Status: DC
  Administered 2016-06-10 – 2016-06-11 (×2): 25 mg via ORAL
  Filled 2016-06-10 (×2): qty 1

## 2016-06-10 MED ORDER — INFLUENZA VAC SPLIT QUAD 0.5 ML IM SUSY: 0.5000 mL | PREFILLED_SYRINGE | INTRAMUSCULAR | Status: DC

## 2016-06-10 MED ORDER — SODIUM CHLORIDE 0.9 % IV SOLN
500.0000 mg | Freq: Two times a day (BID) | INTRAVENOUS | Status: DC
  Administered 2016-06-10: 07:00:00 500 mg via INTRAVENOUS
  Filled 2016-06-10 (×2): qty 5

## 2016-06-10 MED ORDER — ADULT MULTIVITAMIN W/MINERALS CH
1.0000 | ORAL_TABLET | Freq: Every day | ORAL | Status: DC
  Administered 2016-06-10 – 2016-06-11 (×2): 1 via ORAL
  Filled 2016-06-10 (×2): qty 1

## 2016-06-10 MED ORDER — IPRATROPIUM-ALBUTEROL 0.5-2.5 (3) MG/3ML IN SOLN
3.0000 mL | Freq: Once | RESPIRATORY_TRACT | Status: AC
  Administered 2016-06-10: 3 mL via RESPIRATORY_TRACT
  Filled 2016-06-10: qty 3

## 2016-06-10 MED ORDER — LACTULOSE 10 GM/15ML PO SOLN
30.0000 g | Freq: Four times a day (QID) | ORAL | Status: DC
  Administered 2016-06-10 – 2016-06-11 (×6): 30 g via ORAL
  Filled 2016-06-10 (×7): qty 45
  Filled 2016-06-10: qty 60
  Filled 2016-06-10: qty 30
  Filled 2016-06-10: qty 60
  Filled 2016-06-10: qty 45

## 2016-06-10 MED ORDER — SUCRALFATE 1 G PO TABS
1.0000 g | ORAL_TABLET | Freq: Four times a day (QID) | ORAL | Status: DC
  Administered 2016-06-10 – 2016-06-11 (×6): 1 g via ORAL
  Filled 2016-06-10 (×5): qty 1

## 2016-06-10 MED ORDER — CITALOPRAM HYDROBROMIDE 20 MG PO TABS
20.0000 mg | ORAL_TABLET | ORAL | Status: DC
  Administered 2016-06-10 – 2016-06-11 (×2): 20 mg via ORAL
  Filled 2016-06-10 (×2): qty 1

## 2016-06-10 MED ORDER — FUROSEMIDE 20 MG PO TABS
20.0000 mg | ORAL_TABLET | Freq: Every day | ORAL | Status: DC
  Administered 2016-06-10 – 2016-06-11 (×2): 20 mg via ORAL
  Filled 2016-06-10 (×2): qty 1

## 2016-06-10 MED ORDER — ONDANSETRON HCL 4 MG PO TABS: 4.0000 mg | ORAL_TABLET | Freq: Four times a day (QID) | ORAL | Status: DC | PRN

## 2016-06-10 MED ORDER — INSULIN ASPART 100 UNIT/ML ~~LOC~~ SOLN
0.0000 [IU] | Freq: Three times a day (TID) | SUBCUTANEOUS | Status: DC
  Administered 2016-06-10 – 2016-06-11 (×3): 1 [IU] via SUBCUTANEOUS
  Filled 2016-06-10 (×3): qty 1

## 2016-06-10 MED ORDER — ACETAMINOPHEN 325 MG PO TABS: 650.0000 mg | ORAL_TABLET | Freq: Four times a day (QID) | ORAL | Status: DC | PRN

## 2016-06-10 MED ORDER — OLANZAPINE 10 MG PO TABS
30.0000 mg | ORAL_TABLET | Freq: Every day | ORAL | Status: DC
  Administered 2016-06-10: 20:00:00 30 mg via ORAL
  Filled 2016-06-10: qty 3

## 2016-06-10 MED ORDER — PANTOPRAZOLE SODIUM 40 MG PO TBEC
40.0000 mg | DELAYED_RELEASE_TABLET | Freq: Every day | ORAL | Status: DC
  Administered 2016-06-10 – 2016-06-11 (×2): 40 mg via ORAL
  Filled 2016-06-10 (×2): qty 1

## 2016-06-10 MED ORDER — NADOLOL 20 MG PO TABS
20.0000 mg | ORAL_TABLET | Freq: Every day | ORAL | Status: DC
  Administered 2016-06-10 – 2016-06-11 (×2): 20 mg via ORAL
  Filled 2016-06-10 (×2): qty 1

## 2016-06-10 NOTE — Progress Notes (Signed)
[facility]  Patient Status: Active  [facility] is providing the following services: SN/PT  If patient discharges after hours, please call [phone].   Howard CaseyJason E [name] 06/10/2016, 3:03 PM

## 2016-06-10 NOTE — Care Management (Signed)
Admitted to [facility] with the diagnosis of seizures/falls. Lives with mother, Jamesetta GeraldsBetty Dikes [phone]). Dr Burnadette PopLinthavong is primary care physician. Followed by [facility] in the home. Chronic home oxygen. [facility]. Cane, rolling walker, and electric wheelchair in the home.  Gwenette GreetBrenda S Ibtisam Benge RN MSN CCM Care management [phone]

## 2016-06-10 NOTE — ED Notes (Signed)
Report given to floor RN. Pt taken to floor via stretcher. Vital signs stable prior to transport.  

## 2016-06-10 NOTE — Consult Note (Signed)
Reason for Consult:Seizures Referring Physician: Luberta Mutter  CC: Seizures  HPI: Howard [name] is an 35 y.o. male with a history of cirrhosis but no history of seizures who presents after having had two seizures.  First episode was on Sunday while patient was watching TV in his home living room at 7. The second episode was when he was trying to get some ice on Monday at home. He had generalized tonic-clonic convulsion that was witnessed by patient's mother. Patient seen in the ED on Monday.  Ammonia found to be elevated.   Past Medical History:  Diagnosis Date  . Alcoholic cirrhosis of liver with ascites (HCC)   . Anemia   . COPD (chronic obstructive pulmonary disease) (HCC)   . Depression   . Diabetes (HCC)   . Diabetes mellitus, type II (HCC)   . Esophageal varices (HCC)   . Heart disease   . Hyperlipemia   . Hypertension   . Liver disease   . Multiple thyroid nodules   . Portal hypertensive gastropathy   . Schizophrenia [facility])     Past Surgical History:  Procedure Laterality Date  . ESOPHAGOGASTRODUODENOSCOPY N/A 10/05/2015   Procedure: ESOPHAGOGASTRODUODENOSCOPY (EGD);  Surgeon: Elnita Maxwell, MD;  Location: [facility] ENDOSCOPY;  Service: Endoscopy;  Laterality: N/A;  . ESOPHAGOGASTRODUODENOSCOPY (EGD) WITH PROPOFOL N/A 08/03/2015   Procedure: ESOPHAGOGASTRODUODENOSCOPY (EGD) WITH PROPOFOL;  Surgeon: Elnita Maxwell, MD;  Location: [facility] ENDOSCOPY;  Service: Endoscopy;  Laterality: N/A;  . ESOPHAGOGASTRODUODENOSCOPY (EGD) WITH PROPOFOL N/A 08/31/2015   Procedure: ESOPHAGOGASTRODUODENOSCOPY (EGD) WITH PROPOFOL;  Surgeon: Elnita Maxwell, MD;  Location: [facility] ENDOSCOPY;  Service: Endoscopy;  Laterality: N/A;  . ESOPHAGOGASTRODUODENOSCOPY (EGD) WITH PROPOFOL N/A 04/04/2016   Procedure: ESOPHAGOGASTRODUODENOSCOPY (EGD) WITH PROPOFOL;  Surgeon: Scot Jun, MD;  Location: [facility] ENDOSCOPY;  Service: Endoscopy;  Laterality: N/A;  . NO PAST SURGERIES      Family History   Problem Relation Age of Onset  . Heart disease Mother   . Hypertension Mother   . Hyperlipidemia Mother   . Stroke Father   . Heart attack Father   . Hypertension Father   . Heart disease Father   . Alcohol abuse Father   . Heart disease Brother     Social History:  reports that he has never smoked. He has never used smokeless tobacco. He reports that he drinks about 48.0 oz of alcohol per week . He reports that he does not use drugs.  No Known Allergies  Medications:  I have reviewed the patient's current medications. Prior to Admission:  Prescriptions Prior to Admission  Medication Sig Dispense Refill Last Dose  . citalopram (CELEXA) 20 MG tablet Take 20 mg by mouth every morning.   06/09/2016 at Unknown time  . clobetasol cream (TEMOVATE) 0.05 % Apply 1 application topically 2 (two) times daily.   06/09/2016 at Unknown time  . furosemide (LASIX) 20 MG tablet Take 1 tablet (20 mg total) by mouth daily. 30 tablet 0 06/09/2016 at Unknown time  . lactulose (CHRONULAC) 10 GM/15ML solution Take 45 mLs (30 g total) by mouth 4 (four) times daily. 1892 mL 2 06/09/2016 at Unknown time  . losartan (COZAAR) 100 MG tablet Take 1 tablet (100 mg total) by mouth daily. 30 tablet 0 06/09/2016 at Unknown time  . metFORMIN (GLUCOPHAGE) 500 MG tablet Take 1 tablet (500 mg total) by mouth 2 (two) times daily with a meal. 60 tablet 0 06/09/2016 at Unknown time  . Multiple Vitamin (MULTIVITAMIN WITH MINERALS) TABS tablet  Take 1 tablet by mouth daily. 30 tablet 0 06/09/2016 at Unknown time  . nadolol (CORGARD) 20 MG tablet Take 20 mg by mouth daily.   06/09/2016 at Unknown time  . OLANZapine (ZYPREXA) 15 MG tablet Take 2 tablets (30 mg total) by mouth at bedtime. 60 tablet 5 06/09/2016 at Unknown time  . omeprazole (PRILOSEC) 40 MG capsule Take 1 capsule (40 mg total) by mouth every morning. 30 capsule 2 06/09/2016 at Unknown time  . ondansetron (ZOFRAN) 4 MG tablet Take 4 mg by mouth every 8 (eight) hours as  needed for nausea.   06/09/2016 at Unknown time  . rifaximin (XIFAXAN) 550 MG TABS tablet Take 1 tablet (550 mg total) by mouth 2 (two) times daily. 60 tablet 0 06/09/2016 at Unknown time  . spironolactone (ALDACTONE) 25 MG tablet Take 1 tablet (25 mg total) by mouth daily. 30 tablet 0 06/09/2016 at Unknown time  . sucralfate (CARAFATE) 1 g tablet Take 1 g by mouth 4 (four) times daily.   06/09/2016 at Unknown time   Scheduled: . citalopram  20 mg Oral [id number]  . clobetasol cream  1 application Topical BID  . furosemide  20 mg Oral Daily  . [START ON 06/11/2016] Influenza vac split quadrivalent PF  0.5 mL Intramuscular Tomorrow-1000  . insulin aspart  0-9 Units Subcutaneous TID WC  . lactulose  30 g Oral QID  . levETIRAcetam  500 mg Intravenous Q12H  . multivitamin with minerals  1 tablet Oral Daily  . nadolol  20 mg Oral Daily  . OLANZapine  30 mg Oral QHS  . pantoprazole  40 mg Oral Daily  . rifaximin  550 mg Oral BID  . spironolactone  25 mg Oral Daily  . sucralfate  1 g Oral QID    ROS: Patient too lethargic to provide  Physical Examination: Blood pressure (!) 152/74, pulse 68, temperature 97.7 F (36.5 C), temperature source Oral, resp. rate (!) 23, height 6\' 3"  (1.905 m), weight (!) 157.4 kg (347 lb), SpO2 95 %.  HEENT-  Normocephalic, no lesions, without obvious abnormality.  Normal external eye and conjunctiva.  Normal TM's bilaterally.  Normal auditory canals and external ears. Normal external nose, mucus membranes and septum.  Normal pharynx. Cardiovascular- S1, S2 normal, pulses palpable throughout   Lungs- chest clear, no wheezing, rales, normal symmetric air entry Abdomen- soft, non-tender; bowel sounds normal; no masses,  no organomegaly Extremities- lower extremity edema Lymph-no adenopathy palpable Musculoskeletal-no joint tenderness, deformity or swelling Skin-warm and dry, no hyperpigmentation, vitiligo, or suspicious lesions  Neurological Examination Mental  Status: Lethargic requiring frequent alerting.  Speech minimal.  Able to follow simple commands. Cranial Nerves: II: Discs flat bilaterally; Blinks to bilateral confrontation, pupils equal, round, reactive to light and accommodation III,IV, VI: ptosis not present, extra-ocular motions intact bilaterally V,VII: no facial asymmetry noted VIII: hearing normal bilaterally IX,X: gag reflex present XI: bilateral shoulder shrug XII: midline tongue extension Motor: Lifts all extremities against gravity Sensory: Responds to light touch thorughout Deep Tendon Reflexes: 2+ and symmetric with absent AJ's bilaterally Plantars: Right: upgoing   Left: upgoing Cerebellar: Unable to perform due to lethargy Gait: Unable to perform due to lethargy   Laboratory Studies:   Basic Metabolic Panel:  Recent Labs Lab 06/09/16 2229 06/10/16 0640  NA 138 140  K 3.5 3.5  CL 105 106  CO2 28 30  GLUCOSE 214* 128*  BUN 13 12  CREATININE 0.79 0.69  CALCIUM 8.2* 8.2*    Liver  Function Tests:  Recent Labs Lab 06/09/16 2229  AST 37  ALT 27  ALKPHOS 102  BILITOT 0.8  PROT 7.6  ALBUMIN 3.3*   No results for input(s): LIPASE, AMYLASE in the last 168 hours.  Recent Labs Lab 06/09/16 2230  AMMONIA 90*    CBC:  Recent Labs Lab 06/09/16 2229 06/10/16 0640  WBC 6.6 5.4  NEUTROABS 4.5  --   HGB 8.9* 9.0*  HCT 28.1* 27.8*  MCV 79.0* 78.2*  PLT 114* 101*    Cardiac Enzymes: No results for input(s): CKTOTAL, CKMB, CKMBINDEX, TROPONINI in the last 168 hours.  BNP: Invalid input(s): POCBNP  CBG: No results for input(s): GLUCAP in the last 168 hours.  Microbiology: Results for orders placed or performed during the hospital encounter of 03/03/16  Blood culture (routine x 2)     Status: None   Collection Time: 03/03/16  8:48 AM  Result Value Ref Range Status   Specimen Description BLOOD RIGHT [facility]  Final   Special Requests   Final    BOTTLES DRAWN AEROBIC AND ANAEROBIC AER ANA    Culture NO GROWTH 8 DAYS  Final   Report Status 03/11/2016 FINAL  Final  Blood culture (routine x 2)     Status: None   Collection Time: 03/03/16  8:48 AM  Result Value Ref Range Status   Specimen Description BLOOD LEFT HAND  Final   Special Requests BOTTLES DRAWN AEROBIC AND ANAEROBIC 11 ML  Final   Culture NO GROWTH 8 DAYS  Final   Report Status 03/11/2016 FINAL  Final    Coagulation Studies:  Recent Labs  06/09/16 2229  LABPROT 16.3*  INR 1.30    Urinalysis:  Recent Labs Lab 06/10/16 0119  COLORURINE YELLOW*  LABSPEC 1.025  PHURINE 7.0  GLUCOSEU NEGATIVE  HGBUR 3+*  BILIRUBINUR NEGATIVE  KETONESUR NEGATIVE  PROTEINUR 30*  NITRITE NEGATIVE  LEUKOCYTESUR NEGATIVE    Lipid Panel:     Component Value Date/Time   CHOL 208 (H) 05/30/2015 1950   CHOL 234 (H) 12/28/2014 0648   TRIG 126 05/30/2015 1950   TRIG 88 12/28/2014 0648   HDL 19 (L) 05/30/2015 1950   HDL 31 (L) 12/28/2014 0648   CHOLHDL 10.9 05/30/2015 1950   VLDL 25 05/30/2015 1950   VLDL 18 12/28/2014 0648   LDLCALC 164 (H) 05/30/2015 1950   LDLCALC 185 (H) 12/28/2014 0648    HgbA1C:  Lab Results  Component Value Date   HGBA1C 6.1 (H) 05/30/2015    Urine Drug Screen:     Component Value Date/Time   LABOPIA NONE DETECTED 04/27/2016 2109   COCAINSCRNUR NONE DETECTED 04/27/2016 2109   LABBENZ NONE DETECTED 04/27/2016 2109   AMPHETMU NONE DETECTED 04/27/2016 2109   THCU NONE DETECTED 04/27/2016 2109   LABBARB NONE DETECTED 04/27/2016 2109    Alcohol Level:  Recent Labs Lab 06/09/16 2229  ETH <5    Other results: EKG: sinus rhythm at 82 bpm.  Imaging: Dg Chest 2 View  Result Date: 06/09/2016 CLINICAL DATA:  Reported seizure. EXAM: CHEST  2 VIEW COMPARISON:  Chest radiograph 05/11/2016 FINDINGS: Shallow lung inflation. Cardiomediastinal contours are unchanged. There are linear bibasilar opacities, likely atelectasis. No focal consolidation. No pneumothorax or sizable pleural  effusion. IMPRESSION: No active cardiopulmonary disease. Electronically Signed   By: Deatra Robinson M.D.   On: 06/09/2016 22:30   Ct Head Wo Contrast  Result Date: 06/10/2016 CLINICAL DATA:  Do a shin for acute seizure with fall.  EXAM: CT HEAD WITHOUT CONTRAST CT CERVICAL SPINE WITHOUT CONTRAST TECHNIQUE: Multidetector CT imaging of the head and cervical spine was performed following the standard protocol without intravenous contrast. Multiplanar CT image reconstructions of the cervical spine were also generated. COMPARISON:  Prior CT from 06/09/2016. FINDINGS: CT HEAD FINDINGS Brain: Cerebral volume within normal limits. No evidence for acute intracranial hemorrhage. No evidence for acute large vessel territory infarct. No mass lesion, midline shift, or mass effect. No hydrocephalus. No extra-axial fluid collection. Vascular: No hyperdense vessel. Skull: Scalp soft tissues within normal limits.  Calvarium intact. Sinuses/Orbits: Orbital soft tissues within normal limits. Globes intact. Paranasal sinuses are clear. No mastoid effusion. CT CERVICAL SPINE FINDINGS Alignment: Mild straightening of the normal cervical lordosis. No listhesis. Skull base and vertebrae: Visualized skullbase intact. Vertebral body height preserved. No acute fracture. Soft tissues and spinal canal: Prevertebral soft tissues normal. No acute soft tissue abnormality within the neck. Disc levels:  No significant degenerative disc disease. Upper chest: Visualized lung apices are clear. Other: No significant finding. IMPRESSION: CT BRAIN: Negative head CT.  No acute intracranial process identified. CT CERVICAL SPINE: No acute traumatic injury within the cervical spine. Electronically Signed   By: Rise Mu M.D.   On: 06/10/2016 01:32   Ct Head Wo Contrast  Result Date: 06/09/2016 CLINICAL DATA:  Grand mal seizure yesterday. EXAM: CT HEAD WITHOUT CONTRAST TECHNIQUE: Contiguous axial images were obtained from the base of the skull  through the vertex without intravenous contrast. COMPARISON:  04/27/2016 FINDINGS: Brain: No acute intracranial abnormality. Specifically, no hemorrhage, hydrocephalus, mass lesion, acute infarction, or significant intracranial injury. Vascular: No hyperdense vessel or unexpected calcification. Skull: No acute calvarial abnormality Sinuses/Orbits: Visualized paranasal sinuses and mastoids clear. Orbital soft tissues unremarkable. Other: None IMPRESSION: Negative. Electronically Signed   By: Charlett Nose M.D.   On: 06/09/2016 16:29   Ct Cervical Spine Wo Contrast  Result Date: 06/10/2016 CLINICAL DATA:  Do a shin for acute seizure with fall. EXAM: CT HEAD WITHOUT CONTRAST CT CERVICAL SPINE WITHOUT CONTRAST TECHNIQUE: Multidetector CT imaging of the head and cervical spine was performed following the standard protocol without intravenous contrast. Multiplanar CT image reconstructions of the cervical spine were also generated. COMPARISON:  Prior CT from 06/09/2016. FINDINGS: CT HEAD FINDINGS Brain: Cerebral volume within normal limits. No evidence for acute intracranial hemorrhage. No evidence for acute large vessel territory infarct. No mass lesion, midline shift, or mass effect. No hydrocephalus. No extra-axial fluid collection. Vascular: No hyperdense vessel. Skull: Scalp soft tissues within normal limits.  Calvarium intact. Sinuses/Orbits: Orbital soft tissues within normal limits. Globes intact. Paranasal sinuses are clear. No mastoid effusion. CT CERVICAL SPINE FINDINGS Alignment: Mild straightening of the normal cervical lordosis. No listhesis. Skull base and vertebrae: Visualized skullbase intact. Vertebral body height preserved. No acute fracture. Soft tissues and spinal canal: Prevertebral soft tissues normal. No acute soft tissue abnormality within the neck. Disc levels:  No significant degenerative disc disease. Upper chest: Visualized lung apices are clear. Other: No significant finding. IMPRESSION: CT  BRAIN: Negative head CT.  No acute intracranial process identified. CT CERVICAL SPINE: No acute traumatic injury within the cervical spine. Electronically Signed   By: Rise Mu M.D.   On: 06/10/2016 01:32     Assessment/Plan: 35 year old male with new onset seizures.  Ammonia elevated at 90.  This may be the cause of his seizures but will rule out other possibilities as well.  Head CT personally reviewed and shows no acute  changes.    Recommendations: 1.  Serum Magnesium and phosphorus 2.  MRI of the brain with and without contrast 3.  Seizure precautions 4.  Ativan prn seizures 5.  Once able to take po would start Keppra 500mg  po BID 6.  EEG  Thana FarrLeslie Mackenzy Grumbine, MD Neurology [phone] 06/10/2016, 11:55 AM

## 2016-06-10 NOTE — Progress Notes (Signed)
Pt transported to EEG 

## 2016-06-10 NOTE — H&P (Signed)
[facility] [place] at [facility]   PATIENT NAME: Howard [name]    MR#:  [id number]  DATE OF BIRTH:  28-Dec-1980  DATE OF ADMISSION:  06/09/2016  PRIMARY CARE PHYSICIAN: Marisue Ivan, MD   REQUESTING/REFERRING PHYSICIAN:   CHIEF COMPLAINT:   Chief Complaint  Patient presents with  . Seizures    HISTORY OF PRESENT ILLNESS: Howard [name]  is a 35 y.o. male with a known history of Alcoholic cirrhosis of liver, COPD, anemia, diabetes mellitus2, multiple thyroid nodules, portal hypertensive gastropathy presented to the emergency room seizure. One episode of seizure was on Sunday and other episode was on Monday night. First episode was and patient was watching TV at his home living room 7. The second episode was when he was trying to get some ice on Monday at home. He had generalized tonic-clonic convulsion was witnessed by patient's mother. Patient is awake and responding to all verbal commands emergency room. No complaints of any chest pain, shortness of breath. No history of headache dizziness or blurry vision. During the workup in the emergency patient's ammonia was evaluated. He was given 1 dose of Keppra intravenously.  PAST MEDICAL HISTORY:   Past Medical History:  Diagnosis Date  . Alcoholic cirrhosis of liver with ascites (HCC)   . Anemia   . COPD (chronic obstructive pulmonary disease) (HCC)   . Depression   . Diabetes (HCC)   . Diabetes mellitus, type II (HCC)   . Esophageal varices (HCC)   . Heart disease   . Hyperlipemia   . Hypertension   . Liver disease   . Multiple thyroid nodules   . Portal hypertensive gastropathy   . Schizophrenia (HCC)     PAST SURGICAL HISTORY: Past Surgical History:  Procedure Laterality Date  . ESOPHAGOGASTRODUODENOSCOPY N/A 10/05/2015   Procedure: ESOPHAGOGASTRODUODENOSCOPY (EGD);  Surgeon: Elnita Maxwell, MD;  Location: [facility] ENDOSCOPY;  Service: Endoscopy;  Laterality: N/A;  . ESOPHAGOGASTRODUODENOSCOPY  (EGD) WITH PROPOFOL N/A 08/03/2015   Procedure: ESOPHAGOGASTRODUODENOSCOPY (EGD) WITH PROPOFOL;  Surgeon: Elnita Maxwell, MD;  Location: [facility] ENDOSCOPY;  Service: Endoscopy;  Laterality: N/A;  . ESOPHAGOGASTRODUODENOSCOPY (EGD) WITH PROPOFOL N/A 08/31/2015   Procedure: ESOPHAGOGASTRODUODENOSCOPY (EGD) WITH PROPOFOL;  Surgeon: Elnita Maxwell, MD;  Location: [facility] ENDOSCOPY;  Service: Endoscopy;  Laterality: N/A;  . ESOPHAGOGASTRODUODENOSCOPY (EGD) WITH PROPOFOL N/A 04/04/2016   Procedure: ESOPHAGOGASTRODUODENOSCOPY (EGD) WITH PROPOFOL;  Surgeon: Scot Jun, MD;  Location: [facility] ENDOSCOPY;  Service: Endoscopy;  Laterality: N/A;  . NO PAST SURGERIES      SOCIAL HISTORY:  Social History  Substance Use Topics  . Smoking status: Never Smoker  . Smokeless tobacco: Never Used  . Alcohol use 48.0 oz/week    80 Cans of beer per week     Comment: last drink 06/07/16    FAMILY HISTORY:  Family History  Problem Relation Age of Onset  . Heart disease Mother   . Hypertension Mother   . Hyperlipidemia Mother   . Stroke Father   . Heart attack Father   . Hypertension Father   . Heart disease Father   . Alcohol abuse Father   . Heart disease Brother     DRUG ALLERGIES: No Known Allergies  REVIEW OF SYSTEMS:   CONSTITUTIONAL: No fever, fatigue or weakness.  EYES: No blurred or double vision.  EARS, NOSE, AND THROAT: No tinnitus or ear pain.  RESPIRATORY: No cough, shortness of breath, wheezing or hemoptysis.  CARDIOVASCULAR: No chest pain, orthopnea, edema.  GASTROINTESTINAL: No  nausea, vomiting, diarrhea or abdominal pain.  GENITOURINARY: No dysuria, hematuria.  ENDOCRINE: No polyuria, nocturia,  HEMATOLOGY: No anemia, easy bruising or bleeding SKIN: No rash or lesion. MUSCULOSKELETAL: No joint pain or arthritis.   NEUROLOGIC: No tingling, numbness, weakness. Had seizure PSYCHIATRY: No anxiety or depression.   MEDICATIONS AT HOME:  Prior to Admission medications    Medication Sig Start Date End Date Taking? Authorizing Provider  citalopram (CELEXA) 20 MG tablet Take 20 mg by mouth every morning. 04/08/16   Historical Provider, MD  clobetasol cream (TEMOVATE) 0.05 % Apply 1 application topically 2 (two) times daily. 05/22/16 05/22/17  Historical Provider, MD  furosemide (LASIX) 20 MG tablet Take 1 tablet (20 mg total) by mouth daily. 05/15/16   Alford Highland, MD  lactulose (CHRONULAC) 10 GM/15ML solution Take 45 mLs (30 g total) by mouth 4 (four) times daily. 05/15/16   Alford Highland, MD  losartan (COZAAR) 100 MG tablet Take 1 tablet (100 mg total) by mouth daily. 05/15/16   Alford Highland, MD  metFORMIN (GLUCOPHAGE) 500 MG tablet Take 1 tablet (500 mg total) by mouth 2 (two) times daily with a meal. 05/15/16   Alford Highland, MD  Multiple Vitamin (MULTIVITAMIN WITH MINERALS) TABS tablet Take 1 tablet by mouth daily. 08/04/15   Ramonita Lab, MD  nadolol (CORGARD) 20 MG tablet Take 20 mg by mouth daily.    Historical Provider, MD  OLANZapine (ZYPREXA) 15 MG tablet Take 2 tablets (30 mg total) by mouth at bedtime. 05/22/16   Audery Amel, MD  omeprazole (PRILOSEC) 40 MG capsule Take 1 capsule (40 mg total) by mouth every morning. 02/12/16   Enid Baas, MD  ondansetron (ZOFRAN) 4 MG tablet Take 4 mg by mouth every 8 (eight) hours as needed for nausea.    Historical Provider, MD  rifaximin (XIFAXAN) 550 MG TABS tablet Take 1 tablet (550 mg total) by mouth 2 (two) times daily. 05/15/16   Alford Highland, MD  spironolactone (ALDACTONE) 25 MG tablet Take 1 tablet (25 mg total) by mouth daily. 05/15/16   Alford Highland, MD  sucralfate (CARAFATE) 1 g tablet Take 1 g by mouth 4 (four) times daily.    Historical Provider, MD      PHYSICAL EXAMINATION:   VITAL SIGNS: Blood pressure (!) 144/69, pulse 72, temperature 98.5 F (36.9 C), temperature source Oral, resp. rate (!) 24, height 6\' 3"  (1.905 m), weight (!) 157.4 kg (347 lb), SpO2 93 %.  GENERAL:  35  y.o.-year-old patient lying in the bed with no acute distress.  EYES: Pupils equal, round, reactive to light and accommodation. No scleral icterus. Extraocular muscles intact.  HEENT: Head atraumatic, normocephalic. Oropharynx and nasopharynx clear.  NECK:  Supple, no jugular venous distention. No thyroid enlargement, no tenderness.  LUNGS: Normal breath sounds bilaterally, no wheezing, rales,rhonchi or crepitation. No use of accessory muscles of respiration.  CARDIOVASCULAR: S1, S2 normal. No murmurs, rubs, or gallops.  ABDOMEN: Soft, nontender, nondistended. Bowel sounds present. No organomegaly or mass.  EXTREMITIES: No pedal edema, cyanosis, or clubbing.  NEUROLOGIC: Cranial nerves II through XII are intact. Muscle strength 5/5 in all extremities. Sensation intact. Gait not checked.  PSYCHIATRIC: The patient is alert and oriented x 3.  SKIN: No obvious rash, lesion, or ulcer.   LABORATORY PANEL:   CBC  Recent Labs Lab 06/09/16 2229  WBC 6.6  HGB 8.9*  HCT 28.1*  PLT 114*  MCV 79.0*  MCH 25.0*  MCHC 31.7*  RDW 17.0*  LYMPHSABS 1.2  MONOABS 0.6  EOSABS 0.2  BASOSABS 0.1   ------------------------------------------------------------------------------------------------------------------  Chemistries   Recent Labs Lab 06/09/16 2229  NA 138  K 3.5  CL 105  CO2 28  GLUCOSE 214*  BUN 13  CREATININE 0.79  CALCIUM 8.2*  AST 37  ALT 27  ALKPHOS 102  BILITOT 0.8   ------------------------------------------------------------------------------------------------------------------ estimated creatinine clearance is 203.3 mL/min (by C-G formula based on SCr of 0.79 mg/dL). ------------------------------------------------------------------------------------------------------------------ No results for input(s): TSH, T4TOTAL, T3FREE, THYROIDAB in the last 72 hours.  Invalid input(s): FREET3   Coagulation profile  Recent Labs Lab 06/09/16 2229  INR 1.30    ------------------------------------------------------------------------------------------------------------------- No results for input(s): DDIMER in the last 72 hours. -------------------------------------------------------------------------------------------------------------------  Cardiac Enzymes No results for input(s): CKMB, TROPONINI, MYOGLOBIN in the last 168 hours.  Invalid input(s): CK ------------------------------------------------------------------------------------------------------------------ Invalid input(s): POCBNP  ---------------------------------------------------------------------------------------------------------------  Urinalysis    Component Value Date/Time   COLORURINE YELLOW (A) 06/10/2016 0119   APPEARANCEUR CLEAR (A) 06/10/2016 0119   APPEARANCEUR Clear 12/25/2014 1404   LABSPEC 1.025 06/10/2016 0119   LABSPEC 1.016 12/25/2014 1404   PHURINE 7.0 06/10/2016 0119   GLUCOSEU NEGATIVE 06/10/2016 0119   GLUCOSEU Negative 12/25/2014 1404   HGBUR 3+ (A) 06/10/2016 0119   BILIRUBINUR NEGATIVE 06/10/2016 0119   BILIRUBINUR Negative 12/25/2014 1404   KETONESUR NEGATIVE 06/10/2016 0119   PROTEINUR 30 (A) 06/10/2016 0119   NITRITE NEGATIVE 06/10/2016 0119   LEUKOCYTESUR NEGATIVE 06/10/2016 0119   LEUKOCYTESUR Negative 12/25/2014 1404     RADIOLOGY: Dg Chest 2 View  Result Date: 06/09/2016 CLINICAL DATA:  Reported seizure. EXAM: CHEST  2 VIEW COMPARISON:  Chest radiograph 05/11/2016 FINDINGS: Shallow lung inflation. Cardiomediastinal contours are unchanged. There are linear bibasilar opacities, likely atelectasis. No focal consolidation. No pneumothorax or sizable pleural effusion. IMPRESSION: No active cardiopulmonary disease. Electronically Signed   By: Deatra RobinsonKevin  Herman M.D.   On: 06/09/2016 22:30   Ct Head Wo Contrast  Result Date: 06/10/2016 CLINICAL DATA:  Do a shin for acute seizure with fall. EXAM: CT HEAD WITHOUT CONTRAST CT CERVICAL SPINE WITHOUT  CONTRAST TECHNIQUE: Multidetector CT imaging of the head and cervical spine was performed following the standard protocol without intravenous contrast. Multiplanar CT image reconstructions of the cervical spine were also generated. COMPARISON:  Prior CT from 06/09/2016. FINDINGS: CT HEAD FINDINGS Brain: Cerebral volume within normal limits. No evidence for acute intracranial hemorrhage. No evidence for acute large vessel territory infarct. No mass lesion, midline shift, or mass effect. No hydrocephalus. No extra-axial fluid collection. Vascular: No hyperdense vessel. Skull: Scalp soft tissues within normal limits.  Calvarium intact. Sinuses/Orbits: Orbital soft tissues within normal limits. Globes intact. Paranasal sinuses are clear. No mastoid effusion. CT CERVICAL SPINE FINDINGS Alignment: Mild straightening of the normal cervical lordosis. No listhesis. Skull base and vertebrae: Visualized skullbase intact. Vertebral body height preserved. No acute fracture. Soft tissues and spinal canal: Prevertebral soft tissues normal. No acute soft tissue abnormality within the neck. Disc levels:  No significant degenerative disc disease. Upper chest: Visualized lung apices are clear. Other: No significant finding. IMPRESSION: CT BRAIN: Negative head CT.  No acute intracranial process identified. CT CERVICAL SPINE: No acute traumatic injury within the cervical spine. Electronically Signed   By: Rise MuBenjamin  McClintock M.D.   On: 06/10/2016 01:32   Ct Head Wo Contrast  Result Date: 06/09/2016 CLINICAL DATA:  Grand mal seizure yesterday. EXAM: CT HEAD WITHOUT CONTRAST TECHNIQUE: Contiguous axial images were obtained from the base of the skull through the vertex without intravenous  contrast. COMPARISON:  04/27/2016 FINDINGS: Brain: No acute intracranial abnormality. Specifically, no hemorrhage, hydrocephalus, mass lesion, acute infarction, or significant intracranial injury. Vascular: No hyperdense vessel or unexpected  calcification. Skull: No acute calvarial abnormality Sinuses/Orbits: Visualized paranasal sinuses and mastoids clear. Orbital soft tissues unremarkable. Other: None IMPRESSION: Negative. Electronically Signed   By: Charlett Nose M.D.   On: 06/09/2016 16:29   Ct Cervical Spine Wo Contrast  Result Date: 06/10/2016 CLINICAL DATA:  Do a shin for acute seizure with fall. EXAM: CT HEAD WITHOUT CONTRAST CT CERVICAL SPINE WITHOUT CONTRAST TECHNIQUE: Multidetector CT imaging of the head and cervical spine was performed following the standard protocol without intravenous contrast. Multiplanar CT image reconstructions of the cervical spine were also generated. COMPARISON:  Prior CT from 06/09/2016. FINDINGS: CT HEAD FINDINGS Brain: Cerebral volume within normal limits. No evidence for acute intracranial hemorrhage. No evidence for acute large vessel territory infarct. No mass lesion, midline shift, or mass effect. No hydrocephalus. No extra-axial fluid collection. Vascular: No hyperdense vessel. Skull: Scalp soft tissues within normal limits.  Calvarium intact. Sinuses/Orbits: Orbital soft tissues within normal limits. Globes intact. Paranasal sinuses are clear. No mastoid effusion. CT CERVICAL SPINE FINDINGS Alignment: Mild straightening of the normal cervical lordosis. No listhesis. Skull base and vertebrae: Visualized skullbase intact. Vertebral body height preserved. No acute fracture. Soft tissues and spinal canal: Prevertebral soft tissues normal. No acute soft tissue abnormality within the neck. Disc levels:  No significant degenerative disc disease. Upper chest: Visualized lung apices are clear. Other: No significant finding. IMPRESSION: CT BRAIN: Negative head CT.  No acute intracranial process identified. CT CERVICAL SPINE: No acute traumatic injury within the cervical spine. Electronically Signed   By: Rise Mu M.D.   On: 06/10/2016 01:32    EKG: Orders placed or performed during the hospital  encounter of 06/09/16  . ED EKG  . ED EKG    IMPRESSION AND PLAN: 35 year old male patient with history of alcoholic cirrhosis of liver, hypertension, COPD, hyperlipidemia, type 2 diabetes mellitus presented to the emergency room with seizure. During the workup in the emergency room his ammonia level was elevated.  Admitting diagnosis 1. New onset seizure 2. Hyperammonemia 3. Alcoholic cirrhosis of liver 4. Emphysema 5. Type 2 diabetes mellitus 6.Hepatic encephalopathy Treatment plan It patient to medical floor IV Keppra 500 MG every 12 hourly Resume oral lactulose and follow up ammonia level Neurology consultation for seizure Sequential compression devices to lower extremities for DVT prophylaxis Supportive care. All the records are reviewed and case discussed with ED provider. Management plans discussed with the patient, family and they are in agreement.  CODE STATUS:FULL CODE Code Status History    Date Active Date Inactive Code Status Order ID Comments User Context   05/12/2016  3:55 AM 05/15/2016  2:57 PM Full Code [id number]  Ihor Austin, MD Inpatient   04/28/2016  2:54 AM 04/29/2016  6:23 PM Full Code [id number]  Tonye Royalty, DO Inpatient   03/14/2016 12:30 PM 03/16/2016  3:41 PM Full Code [id number]  Shaune Pollack, MD Inpatient   03/03/2016 12:13 PM 03/06/2016  2:26 PM Full Code [id number]  Milagros Loll, MD ED   02/10/2016  5:54 PM 02/12/2016  7:34 PM Full Code [id number]  Ramonita Lab, MD Inpatient   11/11/2015  5:16 AM 11/13/2015  6:00 PM Full Code [id number]  Ihor Austin, MD Inpatient   11/07/2015 10:02 PM 11/09/2015  4:56 PM Full Code [id number]  Enid Baas, MD Inpatient   10/28/2015  8:14 PM 10/29/2015  2:26 PM Full Code [id number]  Altamese Dilling, MD Inpatient   10/16/2015 11:47 PM 10/17/2015  6:00 PM Full Code [id number]  Oralia Manis, MD Inpatient   07/30/2015  5:35 PM 08/04/2015  8:34 PM Full Code [id number]  Enedina Finner, MD Inpatient   07/05/2015  1:16 AM 07/08/2015  7:22 PM  Full Code [id number]  Oralia Manis, MD Inpatient   05/30/2015  7:28 PM 06/01/2015  3:36 PM Full Code [id number]  Audery Amel, MD Inpatient       TOTAL TIME TAKING CARE OF THIS PATIENT: 50 minutes.    Ihor Austin M.D on 06/10/2016 at 4:47 AM  Between 7am to 6pm - Pager - [phone]  After 6pm go to [email or web address] - password [email or web address] [facility]  [facility]  Office  [phone]  CC: Primary care physician; Marisue Ivan, MD

## 2016-06-10 NOTE — ED Provider Notes (Signed)
[facility] Emergency Department Provider Note   ____________________________________________   First MD Initiated Contact with Patient 06/09/16 2333     (approximate)  I have reviewed the triage vital signs and the nursing notes.   HISTORY  Chief Complaint Seizures    HPI Howard [name] is a 35 y.o. male who comes into the hospital today with a seizure. The patient reports he had one yesterday as well. He has never had any seizures before. The patient reports that he had an appointment to see his doctor today To get checked out for his seizure. He reports that he did receive a scan of his head but after he left the appointment he had another seizure. According to mom the seizure lasted about 30 seconds. He was grabbing a cup of ice when he hit the floor. He is unsure if he hit his head but mom reports that his face was on the floor so she thinks he might have. Before the seizure started the patient did have some left-sided head pain. He reports it was a funny feeling and then the seizure happened. He doesn't remember afterwards. He denies any chest pain, nausea, vomiting, diarrhea. The patient always has some shortness of breath given his history of COPD. He has been eating and drinking well and has been urinating well. The patient is here today for evaluation.The patient has a history of alcoholism and reports that he did drink over the weekend.   Past Medical History:  Diagnosis Date  . Alcoholic cirrhosis of liver with ascites (HCC)   . Anemia   . COPD (chronic obstructive pulmonary disease) (HCC)   . Depression   . Diabetes (HCC)   . Diabetes mellitus, type II (HCC)   . Esophageal varices (HCC)   . Heart disease   . Hyperlipemia   . Hypertension   . Liver disease   . Multiple thyroid nodules   . Portal hypertensive gastropathy   . Schizophrenia [facility])     Patient Active Problem List   Diagnosis Date Noted  . Altered mental status 05/12/2016  .  Headache 04/29/2016  . OSA (obstructive sleep apnea) 04/29/2016  . Anemia 04/29/2016  . Thrombocytopenia (HCC) 04/29/2016  . Coagulopathy (HCC) 04/29/2016  . Obesity 04/29/2016  . Generalized weakness 04/29/2016  . Acute hepatic encephalopathy (HCC) 03/14/2016  . Controlled type 2 diabetes mellitus without complication (HCC) 01/15/2016  . Normocytic anemia 12/12/2015  . Essential (primary) hypertension 12/11/2015  . Pure hypercholesterolemia 12/11/2015  . Fever 11/11/2015  . Ascites due to alcoholic cirrhosis (HCC) 11/11/2015  . Cirrhosis of liver with ascites (HCC) 11/11/2015  . Hepatic encephalopathy (HCC) 10/16/2015  . Type 2 diabetes mellitus (HCC) 10/16/2015  . Acute renal failure (ARF) (HCC) 07/30/2015  . 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 Alcoholic cirrhosis (HCC) 07/19/2015  . Hypoxia 07/04/2015  . Elevated transaminase level 07/04/2015  . DOE (dyspnea on exertion) 07/04/2015  . Alcohol abuse 05/31/2015  . Hypertension 05/29/2015  . Paranoid schizophrenia (HCC) 03/20/2015    Past Surgical History:  Procedure Laterality Date  . ESOPHAGOGASTRODUODENOSCOPY N/A 10/05/2015   Procedure: ESOPHAGOGASTRODUODENOSCOPY (EGD);  Surgeon: Elnita Maxwell, MD;  Location: [facility] ENDOSCOPY;  Service: Endoscopy;  Laterality: N/A;  . ESOPHAGOGASTRODUODENOSCOPY (EGD) WITH PROPOFOL N/A 08/03/2015   Procedure: ESOPHAGOGASTRODUODENOSCOPY (EGD) WITH PROPOFOL;  Surgeon: Elnita Maxwell, MD;  Location: [facility] ENDOSCOPY;  Service: Endoscopy;  Laterality: N/A;  . ESOPHAGOGASTRODUODENOSCOPY (EGD) WITH PROPOFOL N/A 08/31/2015   Procedure: ESOPHAGOGASTRODUODENOSCOPY (EGD)  WITH PROPOFOL;  Surgeon: Elnita Maxwell, MD;  Location: [facility] ENDOSCOPY;  Service: Endoscopy;  Laterality: N/A;  . ESOPHAGOGASTRODUODENOSCOPY (EGD) WITH PROPOFOL N/A 04/04/2016   Procedure: ESOPHAGOGASTRODUODENOSCOPY (EGD) WITH PROPOFOL;  Surgeon: Scot Jun, MD;  Location: [facility] ENDOSCOPY;   Service: Endoscopy;  Laterality: N/A;  . NO PAST SURGERIES      Prior to Admission medications   Medication Sig Start Date End Date Taking? Authorizing Provider  citalopram (CELEXA) 20 MG tablet Take 20 mg by mouth every morning. 04/08/16   Historical Provider, MD  clobetasol cream (TEMOVATE) 0.05 % Apply 1 application topically 2 (two) times daily. 05/22/16 05/22/17  Historical Provider, MD  furosemide (LASIX) 20 MG tablet Take 1 tablet (20 mg total) by mouth daily. 05/15/16   Alford Highland, MD  lactulose (CHRONULAC) 10 GM/15ML solution Take 45 mLs (30 g total) by mouth 4 (four) times daily. 05/15/16   Alford Highland, MD  losartan (COZAAR) 100 MG tablet Take 1 tablet (100 mg total) by mouth daily. 05/15/16   Alford Highland, MD  metFORMIN (GLUCOPHAGE) 500 MG tablet Take 1 tablet (500 mg total) by mouth 2 (two) times daily with a meal. 05/15/16   Alford Highland, MD  Multiple Vitamin (MULTIVITAMIN WITH MINERALS) TABS tablet Take 1 tablet by mouth daily. 08/04/15   Ramonita Lab, MD  nadolol (CORGARD) 20 MG tablet Take 20 mg by mouth daily.    Historical Provider, MD  OLANZapine (ZYPREXA) 15 MG tablet Take 2 tablets (30 mg total) by mouth at bedtime. 05/22/16   Audery Amel, MD  omeprazole (PRILOSEC) 40 MG capsule Take 1 capsule (40 mg total) by mouth every morning. 02/12/16   Enid Baas, MD  ondansetron (ZOFRAN) 4 MG tablet Take 4 mg by mouth every 8 (eight) hours as needed for nausea.    Historical Provider, MD  rifaximin (XIFAXAN) 550 MG TABS tablet Take 1 tablet (550 mg total) by mouth 2 (two) times daily. 05/15/16   Alford Highland, MD  spironolactone (ALDACTONE) 25 MG tablet Take 1 tablet (25 mg total) by mouth daily. 05/15/16   Alford Highland, MD  sucralfate (CARAFATE) 1 g tablet Take 1 g by mouth 4 (four) times daily.    Historical Provider, MD    Allergies Review of patient's allergies indicates no known allergies.  Family History  Problem Relation Age of Onset  . Heart disease  Mother   . Hypertension Mother   . Hyperlipidemia Mother   . Stroke Father   . Heart attack Father   . Hypertension Father   . Heart disease Father   . Alcohol abuse Father   . Heart disease Brother     Social History Social History  Substance Use Topics  . Smoking status: Never Smoker  . Smokeless tobacco: Never Used  . Alcohol use 48.0 oz/week    80 Cans of beer per week     Comment: last drink 06/07/16    Review of Systems Constitutional: No fever/chills Eyes: No visual changes. ENT: No sore throat. Cardiovascular: Denies chest pain. Respiratory: Denies shortness of breath. Gastrointestinal: No abdominal pain.  No nausea, no vomiting.  No diarrhea.  No constipation. Genitourinary: Negative for dysuria. Musculoskeletal: Negative for back pain. Skin: Negative for rash. Neurological: Seizure, headache  10-point ROS otherwise negative.  ____________________________________________   PHYSICAL EXAM:  VITAL SIGNS: ED Triage Vitals  Enc Vitals Group     BP 06/09/16 2143 (!) 146/82     Pulse Rate 06/09/16 2143 81  Resp 06/09/16 2143 (!) 24     Temp 06/09/16 2143 98.5 F (36.9 C)     Temp Source 06/09/16 2143 Oral     SpO2 06/09/16 2143 97 %     Weight 06/09/16 2147 (!) 347 lb (157.4 kg)     Height 06/09/16 2147 6\' 3"  (1.905 m)     Head Circumference --      Peak Flow --      Pain Score --      Pain Loc --      Pain Edu? --      Excl. in GC? --     Constitutional: Alert and oriented. Well appearing and in no acute distress. Eyes: Conjunctivae are normal. PERRL. EOMI. Head: Atraumatic. Nose: No congestion/rhinnorhea. Neck: No cervical spine tenderness to palpation Mouth/Throat: Mucous membranes are moist.  Oropharynx non-erythematous. Cardiovascular: Normal rate, regular rhythm. Grossly normal heart sounds.  Good peripheral circulation. Respiratory: Normal respiratory effort.  No retractions. Lungs CTAB. Gastrointestinal: Soft and nontender. No  distention. Positive bowel sounds Musculoskeletal: No lower extremity tenderness nor edema.   Neurologic:  Normal speech and language.  Skin:  Skin is warm, dry and intact.  Psychiatric: Mood and affect are normal.   ____________________________________________   LABS (all labs ordered are listed, but only abnormal results are displayed)  Labs Reviewed  CBC WITH DIFFERENTIAL/PLATELET - Abnormal; Notable for the following:       Result Value   RBC 3.56 (*)    Hemoglobin 8.9 (*)    HCT 28.1 (*)    MCV 79.0 (*)    MCH 25.0 (*)    MCHC 31.7 (*)    RDW 17.0 (*)    Platelets 114 (*)    All other components within normal limits  COMPREHENSIVE METABOLIC PANEL - Abnormal; Notable for the following:    Glucose, Bld 214 (*)    Calcium 8.2 (*)    Albumin 3.3 (*)    All other components within normal limits  AMMONIA - Abnormal; Notable for the following:    Ammonia 90 (*)    All other components within normal limits  PROTIME-INR - Abnormal; Notable for the following:    Prothrombin Time 16.3 (*)    All other components within normal limits  URINALYSIS COMPLETEWITH MICROSCOPIC ([facility] ONLY) - Abnormal; Notable for the following:    Color, Urine YELLOW (*)    APPearance CLEAR (*)    Hgb urine dipstick 3+ (*)    Protein, ur 30 (*)    Squamous Epithelial / LPF 0-5 (*)    All other components within normal limits  ETHANOL  APTT  CBG MONITORING, ED   ____________________________________________  EKG  ED ECG REPORT I, Rebecka ApleyWebster,  Jorge Retz P, the attending physician, personally viewed and interpreted this ECG.   Date: 06/09/2016  EKG Time: 2304  Rate: 82  Rhythm: normal sinus rhythm  Axis: normal  Intervals:none  ST&T Change: none  ____________________________________________  RADIOLOGY  CXR CT head and cervical spine ____________________________________________   PROCEDURES  Procedure(s) performed: None  Procedures  Critical Care performed:  No  ____________________________________________   INITIAL IMPRESSION / ASSESSMENT AND PLAN / ED COURSE  Pertinent labs & imaging results that were available during my care of the patient were reviewed by me and considered in my medical decision making (see chart for details).  This is a 35 year old male who comes into the hospital today with a seizure. The patient had one yesterday and this was his second seizure. He was  seen earlier today but had another one and hit his head again. The patient does have some wheezing on exam. I will check some blood work and give the patient a dose of Keppra. I will also give him a DuoNeb treatment and reassessed the patient once I received his blood work and imaging results.  Clinical Course  Value Comment By Time  DG Chest 2 View No active cardiopulmonary disease. Rebecka Apley, MD 09/19 0241  CT Head Wo Contrast Negative head CT.  No acute intracranial process  Rebecka Apley, MD 09/19 [phone]  CT Cervical Spine Wo Contrast No acute traumatic injury within the cervical spine Rebecka Apley, MD 09/19 [phone]   The patient has been in the ER for some time and hasn't had any seizures but given that this is his second seizure in 24 hours feel that he should be admitted to the hospital for further evaluation. The patient also does have an elevated ammonia level. I contacted the hospitalist who will admit the patient.  ____________________________________________   FINAL CLINICAL IMPRESSION(S) / ED DIAGNOSES  Final diagnoses:  Seizure (HCC)      NEW MEDICATIONS STARTED DURING THIS VISIT:  New Prescriptions   No medications on file     Note:  This document was prepared using Dragon voice recognition software and may include unintentional dictation errors.    Rebecka Apley, MD 06/10/16 [phone]

## 2016-06-10 NOTE — Care Management Important Message (Signed)
Important Message  Patient Details  Name: Howard [name] MRN: [id number] Date of Birth: Dec 21, 1980   Medicare Important Message Given:  Yes    Gwenette GreetBrenda S Aamori Mcmasters, RN 06/10/2016, 11:50 AM

## 2016-06-10 NOTE — Progress Notes (Signed)
Admitted for seizure. Patient did not have any seizures after the admission. But he is very sedated. Denies any complaints.   Past medical history significant for alcohol and liver cirrhosis, COPD, diabetes mellitus, esophageal varices, hyperlipidemia, hypertension,  Physical examination; and is arousable but very sleepy. Cardiovascular S1-S2 regular Lungs clear to auscultation J abdomen soft nontender nondistended wasn't present Neurological he patient to unable to follow complete instructions to do full neurological exam because he is very sedated at this time. Lab data reviewed a Chem-7, CBC are within normal limits, ammonia level 90  yesterday. Patient able to take by mouth medications as per registered nurse. #1 new-onset seizure likely secondary to hyperammonemia: Started on IV arm, consult neurology, patient needs a EEG. #2/ alcohol and liver cirrhosis with hepatic encephalopathy with ammonia of 90 on admission: Continue lactulose. #3/ diabetes mellitus type 2'; poor by mouth intake:  hold the metformin, use SSI with coverage only. #4 alcohol with liver cirrhosis; patient  Takes Rifaxamin,, Aldactone, nadolol, lactulose, furosemide. Appreciate  presence of registered nurse in the room while rounding. GI, DVT prophylaxis. Time spent 30 minutes

## 2016-06-10 NOTE — Progress Notes (Signed)
Inpatient Diabetes Program Recommendations  AACE/ADA: New Consensus Statement on Inpatient Glycemic Control (2015)  Target Ranges:  Prepandial:   less than 140 mg/dL      Peak postprandial:   less than 180 mg/dL (1-2 hours)      Critically ill patients:  140 - 180 mg/dL   Lab Results  Component Value Date   GLUCAP 88 05/15/2016   HGBA1C 6.1 (H) 05/30/2015    Review of Glycemic Control  Results for Howard [name], Howard A (MRN [id number]) as of 06/10/2016 14:48- lab glucose  Ref. Range 05/25/2016 15:35 06/01/2016 12:34 06/09/2016 22:29 06/09/2016 22:30 06/10/2016 01:19 06/10/2016 06:40  Glucose Latest Ref Range: 65 - 99 mg/dL 132243 (H) 440247 (H) 102214 (H)   128 (H)    Diabetes history: Type 2 Outpatient Diabetes medications: Metformin 500mg  bid Current orders for Inpatient glycemic control: Novolog 0-9 units tid with meals  Inpatient Diabetes Program Recommendations:   Agree with current orders for blood sugar management  Susette RacerJulie Dalene Robards, RN, [place], [place]HA, CDE Diabetes Coordinator Inpatient Diabetes Program  [phone] (Team Pager) [phone] [facility] Office) 06/10/2016 2:50 PM

## 2016-06-11 ENCOUNTER — Inpatient Hospital Stay: Payer: Medicare Other

## 2016-06-11 LAB — BASIC METABOLIC PANEL
Anion gap: 5 (ref 5–15)
BUN: 12 mg/dL (ref 6–20)
CALCIUM: 8.6 mg/dL — AB (ref 8.9–10.3)
CO2: 31 mmol/L (ref 22–32)
CREATININE: 0.64 mg/dL (ref 0.61–1.24)
Chloride: 105 mmol/L (ref 101–111)
GFR calc non Af Amer: >60 mL/min (ref >60)
Glucose, Bld: 87 mg/dL (ref 65–99)
Potassium: 3.8 mmol/L (ref 3.5–5.1)
SODIUM: 141 mmol/L (ref 135–145)

## 2016-06-11 LAB — GLUCOSE, CAPILLARY
GLUCOSE-CAPILLARY: 88 mg/dL (ref 65–99)
GLUCOSE-CAPILLARY: 129 mg/dL — AB (ref 65–99)
Glucose-Capillary: 129 mg/dL — ABNORMAL HIGH (ref 65–99)

## 2016-06-11 MED ORDER — GADOBENATE DIMEGLUMINE 529 MG/ML IV SOLN
20.0000 mL | Freq: Once | INTRAVENOUS | Status: AC | PRN
  Administered 2016-06-11: 11:00:00 20 mL via INTRAVENOUS

## 2016-06-11 MED ORDER — LEVETIRACETAM 500 MG PO TABS: 500.0000 mg | ORAL_TABLET | Freq: Two times a day (BID) | ORAL | 0 refills | Status: DC

## 2016-06-11 NOTE — Progress Notes (Signed)
Subjective: Patient and mother report that he is back to baseline.  He remains on Keppra.  No further seizures noted.    Objective: Current vital signs: BP (!) 141/70   Pulse 91   Temp 97.7 F (36.5 C) (Oral)   Resp 20   Ht 6\' 3"  (1.905 m)   Wt (!) 156.1 kg (344 lb 1 oz)   SpO2 96%   BMI 43.00 kg/m  Vital signs in last 24 hours: Temp:  [97.7 F (36.5 C)-97.9 F (36.6 C)] 97.7 F (36.5 C) (09/20 0439) Pulse Rate:  [71-91] 91 (09/20 0859) Resp:  [17-20] 20 (09/20 0439) BP: (115-141)/(69-72) 141/70 (09/20 0859) SpO2:  [95 %-96 %] 96 % (09/20 0439) Weight:  [156.1 kg (344 lb 1 oz)] 156.1 kg (344 lb 1 oz) (09/20 0626)  Intake/Output from previous day: 09/19 0701 - 09/20 0700 In: 585 [P.O.:480; IV Piggyback:105] Out: 2150 [Urine:2150] Intake/Output this shift: Total I/O In: 240 [P.O.:240] Out: -  Nutritional status: Diet 2 gram sodium Room service appropriate? Yes; Fluid consistency: Thin  Neurologic Exam: Mental Status: Awake and alert. Speech fluent.  Able to follow simple commands. Cranial Nerves: II: Discs flat bilaterally; VFF, pupils equal, round, reactive to light and accommodation III,IV, VI: ptosis not present, extra-ocular motions intact bilaterally V,VII: no facial asymmetry noted VIII: hearing normal bilaterally IX,X: gag reflex present XI: bilateral shoulder shrug XII: midline tongue extension Motor: Lifts all extremities against gravity Sensory: Responds to light touch thorughout Deep Tendon Reflexes: 2+ and symmetric with absent AJ's bilaterally   Lab Results: Basic Metabolic Panel:  Recent Labs Lab 06/09/16 2229 06/10/16 0640 06/10/16 1648 06/11/16 0516  NA 138 140  --  141  K 3.5 3.5  --  3.8  CL 105 106  --  105  CO2 28 30  --  31  GLUCOSE 214* 128*  --  87  BUN 13 12  --  12  CREATININE 0.79 0.69  --  0.64  CALCIUM 8.2* 8.2*  --  8.6*  MG  --   --  1.9  --   PHOS  --   --  3.1  --     Liver Function Tests:  Recent Labs Lab  06/09/16 2229  AST 37  ALT 27  ALKPHOS 102  BILITOT 0.8  PROT 7.6  ALBUMIN 3.3*   No results for input(s): LIPASE, AMYLASE in the last 168 hours.  Recent Labs Lab 06/09/16 2230  AMMONIA 90*    CBC:  Recent Labs Lab 06/09/16 2229 06/10/16 0640  WBC 6.6 5.4  NEUTROABS 4.5  --   HGB 8.9* 9.0*  HCT 28.1* 27.8*  MCV 79.0* 78.2*  PLT 114* 101*    Cardiac Enzymes: No results for input(s): CKTOTAL, CKMB, CKMBINDEX, TROPONINI in the last 168 hours.  Lipid Panel: No results for input(s): CHOL, TRIG, HDL, CHOLHDL, VLDL, LDLCALC in the last 168 hours.  CBG:  Recent Labs Lab 06/10/16 1113 06/10/16 1656 06/10/16 2112 06/11/16 0744 06/11/16 1140  GLUCAP 123* 112* 110* 88 129*    Microbiology: Results for orders placed or performed during the hospital encounter of 03/03/16  Blood culture (routine x 2)     Status: None   Collection Time: 03/03/16  8:48 AM  Result Value Ref Range Status   Specimen Description BLOOD RIGHT AC  Final   Special Requests   Final    BOTTLES DRAWN AEROBIC AND ANAEROBIC AER 8ML ANA 2ML   Culture NO GROWTH 8 DAYS  Final  Report Status 03/11/2016 FINAL  Final  Blood culture (routine x 2)     Status: None   Collection Time: 03/03/16  8:48 AM  Result Value Ref Range Status   Specimen Description BLOOD LEFT HAND  Final   Special Requests BOTTLES DRAWN AEROBIC AND ANAEROBIC 11 ML  Final   Culture NO GROWTH 8 DAYS  Final   Report Status 03/11/2016 FINAL  Final    Coagulation Studies:  Recent Labs  06/09/16 2229  LABPROT 16.3*  INR 1.30    Imaging: Dg Chest 2 View  Result Date: 06/09/2016 CLINICAL DATA:  Reported seizure. EXAM: CHEST  2 VIEW COMPARISON:  Chest radiograph 05/11/2016 FINDINGS: Shallow lung inflation. Cardiomediastinal contours are unchanged. There are linear bibasilar opacities, likely atelectasis. No focal consolidation. No pneumothorax or sizable pleural effusion. IMPRESSION: No active cardiopulmonary disease.  Electronically Signed   By: Deatra Robinson M.D.   On: 06/09/2016 22:30   Ct Head Wo Contrast  Result Date: 06/10/2016 CLINICAL DATA:  Do a shin for acute seizure with fall. EXAM: CT HEAD WITHOUT CONTRAST CT CERVICAL SPINE WITHOUT CONTRAST TECHNIQUE: Multidetector CT imaging of the head and cervical spine was performed following the standard protocol without intravenous contrast. Multiplanar CT image reconstructions of the cervical spine were also generated. COMPARISON:  Prior CT from 06/09/2016. FINDINGS: CT HEAD FINDINGS Brain: Cerebral volume within normal limits. No evidence for acute intracranial hemorrhage. No evidence for acute large vessel territory infarct. No mass lesion, midline shift, or mass effect. No hydrocephalus. No extra-axial fluid collection. Vascular: No hyperdense vessel. Skull: Scalp soft tissues within normal limits.  Calvarium intact. Sinuses/Orbits: Orbital soft tissues within normal limits. Globes intact. Paranasal sinuses are clear. No mastoid effusion. CT CERVICAL SPINE FINDINGS Alignment: Mild straightening of the normal cervical lordosis. No listhesis. Skull base and vertebrae: Visualized skullbase intact. Vertebral body height preserved. No acute fracture. Soft tissues and spinal canal: Prevertebral soft tissues normal. No acute soft tissue abnormality within the neck. Disc levels:  No significant degenerative disc disease. Upper chest: Visualized lung apices are clear. Other: No significant finding. IMPRESSION: CT BRAIN: Negative head CT.  No acute intracranial process identified. CT CERVICAL SPINE: No acute traumatic injury within the cervical spine. Electronically Signed   By: Rise Mu M.D.   On: 06/10/2016 01:32   Ct Head Wo Contrast  Result Date: 06/09/2016 CLINICAL DATA:  Grand mal seizure yesterday. EXAM: CT HEAD WITHOUT CONTRAST TECHNIQUE: Contiguous axial images were obtained from the base of the skull through the vertex without intravenous contrast.  COMPARISON:  04/27/2016 FINDINGS: Brain: No acute intracranial abnormality. Specifically, no hemorrhage, hydrocephalus, mass lesion, acute infarction, or significant intracranial injury. Vascular: No hyperdense vessel or unexpected calcification. Skull: No acute calvarial abnormality Sinuses/Orbits: Visualized paranasal sinuses and mastoids clear. Orbital soft tissues unremarkable. Other: None IMPRESSION: Negative. Electronically Signed   By: Charlett Nose M.D.   On: 06/09/2016 16:29   Ct Cervical Spine Wo Contrast  Result Date: 06/10/2016 CLINICAL DATA:  Do a shin for acute seizure with fall. EXAM: CT HEAD WITHOUT CONTRAST CT CERVICAL SPINE WITHOUT CONTRAST TECHNIQUE: Multidetector CT imaging of the head and cervical spine was performed following the standard protocol without intravenous contrast. Multiplanar CT image reconstructions of the cervical spine were also generated. COMPARISON:  Prior CT from 06/09/2016. FINDINGS: CT HEAD FINDINGS Brain: Cerebral volume within normal limits. No evidence for acute intracranial hemorrhage. No evidence for acute large vessel territory infarct. No mass lesion, midline shift, or mass effect. No hydrocephalus.  No extra-axial fluid collection. Vascular: No hyperdense vessel. Skull: Scalp soft tissues within normal limits.  Calvarium intact. Sinuses/Orbits: Orbital soft tissues within normal limits. Globes intact. Paranasal sinuses are clear. No mastoid effusion. CT CERVICAL SPINE FINDINGS Alignment: Mild straightening of the normal cervical lordosis. No listhesis. Skull base and vertebrae: Visualized skullbase intact. Vertebral body height preserved. No acute fracture. Soft tissues and spinal canal: Prevertebral soft tissues normal. No acute soft tissue abnormality within the neck. Disc levels:  No significant degenerative disc disease. Upper chest: Visualized lung apices are clear. Other: No significant finding. IMPRESSION: CT BRAIN: Negative head CT.  No acute intracranial  process identified. CT CERVICAL SPINE: No acute traumatic injury within the cervical spine. Electronically Signed   By: Rise Mu M.D.   On: 06/10/2016 01:32   Mr Laqueta Jean ZO Contrast  Result Date: 06/11/2016 CLINICAL DATA:  Seizure EXAM: MRI HEAD WITHOUT AND WITH CONTRAST TECHNIQUE: Multiplanar, multiecho pulse sequences of the brain and surrounding structures were obtained without and with intravenous contrast. CONTRAST:  20mL MULTIHANCE GADOBENATE DIMEGLUMINE 529 MG/ML IV SOLN COMPARISON:  CT head 06/10/2016 FINDINGS: Brain: Image quality degraded by motion on some sequences. Ventricle size normal. Cerebral volume normal. Negative for acute or chronic infarction. Negative for demyelinating disease. Negative for hemorrhage or fluid collection. Negative for mass or edema. Normal temporal lobe anatomy. Normal enhancement postcontrast administration. Vascular: Normal flow voids. Skull and upper cervical spine: Negative Sinuses/Orbits: Small air-fluid level in the sphenoid sinus. Remaining sinuses clear. Normal orbital contents. Other: Normal soft tissues IMPRESSION: Negative MRI of the brain with contrast Small air-fluid level sphenoid sinus. Electronically Signed   By: Marlan Palau M.D.   On: 06/11/2016 11:31    Medications:  I have reviewed the patient's current medications. Scheduled: . citalopram  20 mg Oral [id number]  . clobetasol cream  1 application Topical BID  . furosemide  20 mg Oral Daily  . Influenza vac split quadrivalent PF  0.5 mL Intramuscular Tomorrow-1000  . insulin aspart  0-9 Units Subcutaneous TID WC  . lactulose  30 g Oral QID  . levETIRAcetam  500 mg Oral BID  . multivitamin with minerals  1 tablet Oral Daily  . nadolol  20 mg Oral Daily  . OLANZapine  30 mg Oral QHS  . pantoprazole  40 mg Oral Daily  . rifaximin  550 mg Oral BID  . spironolactone  25 mg Oral Daily  . sucralfate  1 g Oral QID    Assessment/Plan: Patient back to baseline.  No further seizures.   Tolerating Keppra.  EEG shows no epileptiform activity.  MRI of the brain personally reviewed and unremarkable.  Seizures may very well have been provoked but patient has had multiple therefore will remain on anticonvulsant therapy for now.  Recommendations: 1.  Continue Keppra at 500mg  BID.  To see neurology as an outpatient with long term use to be determined at that time. 2.  No further neurologic intervention is recommended at this time.  If further questions arise, please call or page at that time.  Thank you for allowing neurology to participate in the care of this patient.    LOS: 1 day   Thana Farr, MD Neurology [phone] 06/11/2016  12:35 PM

## 2016-06-11 NOTE — Progress Notes (Signed)
Patient d/ced home.  MRI of head negative.  Patient has had no further seizures.  Wasn't on seizure medication and is going home on Keppra.  Patient has been informed that he can't drive for 6 mos.  Wasn't able to make a f/u appt with Dr. Malvin JohnsPotter the neurologist - their office was closed.  Appt made with Dr. Burnadette PopLinthavong.  IV removed - took a long time for him to stop bleeding.  Reviewed d/c instructions with mother and patient.  Patient going home with his mother.

## 2016-06-11 NOTE — Discharge Summary (Signed)
Howard [name], is a 35 y.o. male  DOB 13-May-1981  MRN [id number].  Admission date:  06/09/2016  Admitting Physician  Ihor AustinPavan Pyreddy, MD  Discharge Date:  06/11/2016   Primary MD  Marisue IvanLINTHAVONG, [name], MD  Recommendations for primary care physician for things to follow:  Make the appointment with the neurology Dr. Malvin JohnsPotter, first available appointment to follow up on the new onset seizures.    Admission Diagnosis  Seizure (HCC) [R56.9]   Discharge Diagnosis  Seizure (HCC) [R56.9]    Principal Problem:   Seizure [facility](HCC)      Past Medical History:  Diagnosis Date  . Alcoholic cirrhosis of liver with ascites (HCC)   . Anemia   . COPD (chronic obstructive pulmonary disease) (HCC)   . Depression   . Diabetes (HCC)   . Diabetes mellitus, type II (HCC)   . Esophageal varices (HCC)   . Heart disease   . Hyperlipemia   . Hypertension   . Liver disease   . Multiple thyroid nodules   . Portal hypertensive gastropathy   . Schizophrenia [facility](HCC)     Past Surgical History:  Procedure Laterality Date  . ESOPHAGOGASTRODUODENOSCOPY N/A 10/05/2015   Procedure: ESOPHAGOGASTRODUODENOSCOPY (EGD);  Surgeon: Elnita MaxwellMatthew Gordon Rein, MD;  [facility];  Service: Endoscopy;  Laterality: N/A;  . ESOPHAGOGASTRODUODENOSCOPY (EGD) WITH PROPOFOL N/A 08/03/2015   Procedure: ESOPHAGOGASTRODUODENOSCOPY (EGD) WITH PROPOFOL;  Surgeon: Elnita MaxwellMatthew Gordon Rein, MD;  [facility];  Service: Endoscopy;  Laterality: N/A;  . ESOPHAGOGASTRODUODENOSCOPY (EGD) WITH PROPOFOL N/A 08/31/2015   Procedure: ESOPHAGOGASTRODUODENOSCOPY (EGD) WITH PROPOFOL;  Surgeon: Elnita MaxwellMatthew Gordon Rein, MD;  [facility];  Service: Endoscopy;  Laterality: N/A;  . ESOPHAGOGASTRODUODENOSCOPY (EGD) WITH PROPOFOL N/A 04/04/2016   Procedure:  ESOPHAGOGASTRODUODENOSCOPY (EGD) WITH PROPOFOL;  Surgeon: Scot Junobert T Elliott, MD;  [facility];  Service: Endoscopy;  Laterality: N/A;  . NO PAST SURGERIES         History of present illness and  Hospital Course:     Kindly see H&P for history of present illness and admission details, please review complete Labs, Consult reports and Test reports for all details in brief  HPI  from the history and physical done on the day of admission 35 year old male patient with history of fall colic liver cirrhosis with ascites, COPD, depression, diabetes mellitus, is a basal viruses, essential hypertension, portal hypertensive gastropathy, schizophrenia came in because of seizure. Patient had 2 seizures one on Sunday and one on Monday. Patient had generalized tonic, clonic convulsions witnessed by patient's mother.   Hospital Course  #1 onset seizure: Ammonia elevated at 90. Patient had CAT scan did not show any changes. Patient admitted to medical service, started on Ativan, Keppra. And patient seen by neurology Dr. Thad Rangereynolds. Received EEG. Patient EEG did not show any seizure activity. MRI of the brain is pending. Patient's CBC, Chem-7 within normal limits. Ammonia level elevated on admission to 90. Patient on lactulose. He is more alert, awake, oriented. Did not have any seizures since admission. Neurologist recommended starting the patient on Keppra 500 mg by mouth twice a day. The same discussed with the patient. Patient should not drive and also should not operate heavy machines. Patient lives with the mother and mother drives for all the appointments. And we will make appointment with [facility]. [facility] neurology as an outpatient for follow-up on the seizures.  can be discharged home  If  MRI of the brain negative.  #2 alcohol liver cirrhosis with ascites, portal hypertensive gastropathy:  Patient takes Lasix, metoprolol. #3 history of ascites: Patient takes Lasix, Aldactone. #4 history of hepatic  encephalopathy: Patient takes the lactulose and the  xifaxan #5 diabetes mellitus type 2: Controlled continue metformin.  Discharge Condition: stable   Follow UP      Discharge Instructions  and  Discharge Medications       Medication List    TAKE these medications   citalopram 20 MG tablet Commonly known as:  CELEXA Take 20 mg by mouth every morning.   clobetasol cream 0.05 % Commonly known as:  TEMOVATE Apply 1 application topically 2 (two) times daily.   furosemide 20 MG tablet Commonly known as:  LASIX Take 1 tablet (20 mg total) by mouth daily.   lactulose 10 GM/15ML solution Commonly known as:  CHRONULAC Take 45 mLs (30 g total) by mouth 4 (four) times daily.   levETIRAcetam 500 MG tablet Commonly known as:  KEPPRA Take 1 tablet (500 mg total) by mouth 2 (two) times daily.   losartan 100 MG tablet Commonly known as:  COZAAR Take 1 tablet (100 mg total) by mouth daily.   metFORMIN 500 MG tablet Commonly known as:  GLUCOPHAGE Take 1 tablet (500 mg total) by mouth 2 (two) times daily with a meal.   multivitamin with minerals Tabs tablet Take 1 tablet by mouth daily.   nadolol 20 MG tablet Commonly known as:  CORGARD Take 20 mg by mouth daily.   OLANZapine 15 MG tablet Commonly known as:  ZYPREXA Take 2 tablets (30 mg total) by mouth at bedtime.   omeprazole 40 MG capsule Commonly known as:  PRILOSEC Take 1 capsule (40 mg total) by mouth every morning.   ondansetron 4 MG tablet Commonly known as:  ZOFRAN Take 4 mg by mouth every 8 (eight) hours as needed for nausea.   rifaximin 550 MG Tabs tablet Commonly known as:  XIFAXAN Take 1 tablet (550 mg total) by mouth 2 (two) times daily.   spironolactone 25 MG tablet Commonly known as:  ALDACTONE Take 1 tablet (25 mg total) by mouth daily.   sucralfate 1 g tablet Commonly known as:  CARAFATE Take 1 g by mouth 4 (four) times daily.         Diet and Activity recommendation: See Discharge  Instructions above  Sodium ADA diet. No heavy machines operation, no driving for 6 months Consults obtained - neurology consult   Major procedures and Radiology Reports - PLEASE review detailed and final reports for all details, in brief -      Dg Chest 2 View  Result Date: 06/09/2016 CLINICAL DATA:  Reported seizure. EXAM: CHEST  2 VIEW COMPARISON:  Chest radiograph 05/11/2016 FINDINGS: Shallow lung inflation. Cardiomediastinal contours are unchanged. There are linear bibasilar opacities, likely atelectasis. No focal consolidation. No pneumothorax or sizable pleural effusion. IMPRESSION: No active cardiopulmonary disease. Electronically Signed   By: Deatra Robinson M.D.   On: 06/09/2016 22:30   Ct Head Wo Contrast  Result Date: 06/10/2016 CLINICAL DATA:  Do a shin for acute seizure with fall. EXAM: CT HEAD WITHOUT CONTRAST CT CERVICAL SPINE WITHOUT CONTRAST TECHNIQUE: Multidetector CT imaging of the head and cervical spine was performed following the standard protocol without intravenous contrast. Multiplanar CT image reconstructions of the cervical spine were also generated. COMPARISON:  Prior CT from 06/09/2016. FINDINGS: CT HEAD FINDINGS Brain: Cerebral volume within normal limits. No evidence for acute intracranial hemorrhage. No evidence for acute large vessel territory infarct. No mass lesion, midline shift, or  mass effect. No hydrocephalus. No extra-axial fluid collection. Vascular: No hyperdense vessel. Skull: Scalp soft tissues within normal limits.  Calvarium intact. Sinuses/Orbits: Orbital soft tissues within normal limits. Globes intact. Paranasal sinuses are clear. No mastoid effusion. CT CERVICAL SPINE FINDINGS Alignment: Mild straightening of the normal cervical lordosis. No listhesis. Skull base and vertebrae: Visualized skullbase intact. Vertebral body height preserved. No acute fracture. Soft tissues and spinal canal: Prevertebral soft tissues normal. No acute soft tissue  abnormality within the neck. Disc levels:  No significant degenerative disc disease. Upper chest: Visualized lung apices are clear. Other: No significant finding. IMPRESSION: CT BRAIN: Negative head CT.  No acute intracranial process identified. CT CERVICAL SPINE: No acute traumatic injury within the cervical spine. Electronically Signed   By: Rise Mu M.D.   On: 06/10/2016 01:32   Ct Head Wo Contrast  Result Date: 06/09/2016 CLINICAL DATA:  Grand mal seizure yesterday. EXAM: CT HEAD WITHOUT CONTRAST TECHNIQUE: Contiguous axial images were obtained from the base of the skull through the vertex without intravenous contrast. COMPARISON:  04/27/2016 FINDINGS: Brain: No acute intracranial abnormality. Specifically, no hemorrhage, hydrocephalus, mass lesion, acute infarction, or significant intracranial injury. Vascular: No hyperdense vessel or unexpected calcification. Skull: No acute calvarial abnormality Sinuses/Orbits: Visualized paranasal sinuses and mastoids clear. Orbital soft tissues unremarkable. Other: None IMPRESSION: Negative. Electronically Signed   By: Charlett Nose M.D.   On: 06/09/2016 16:29   Ct Cervical Spine Wo Contrast  Result Date: 06/10/2016 CLINICAL DATA:  Do a shin for acute seizure with fall. EXAM: CT HEAD WITHOUT CONTRAST CT CERVICAL SPINE WITHOUT CONTRAST TECHNIQUE: Multidetector CT imaging of the head and cervical spine was performed following the standard protocol without intravenous contrast. Multiplanar CT image reconstructions of the cervical spine were also generated. COMPARISON:  Prior CT from 06/09/2016. FINDINGS: CT HEAD FINDINGS Brain: Cerebral volume within normal limits. No evidence for acute intracranial hemorrhage. No evidence for acute large vessel territory infarct. No mass lesion, midline shift, or mass effect. No hydrocephalus. No extra-axial fluid collection. Vascular: No hyperdense vessel. Skull: Scalp soft tissues within normal limits.  Calvarium intact.  Sinuses/Orbits: Orbital soft tissues within normal limits. Globes intact. Paranasal sinuses are clear. No mastoid effusion. CT CERVICAL SPINE FINDINGS Alignment: Mild straightening of the normal cervical lordosis. No listhesis. Skull base and vertebrae: Visualized skullbase intact. Vertebral body height preserved. No acute fracture. Soft tissues and spinal canal: Prevertebral soft tissues normal. No acute soft tissue abnormality within the neck. Disc levels:  No significant degenerative disc disease. Upper chest: Visualized lung apices are clear. Other: No significant finding. IMPRESSION: CT BRAIN: Negative head CT.  No acute intracranial process identified. CT CERVICAL SPINE: No acute traumatic injury within the cervical spine. Electronically Signed   By: Rise Mu M.D.   On: 06/10/2016 01:32    Micro Results    No results found for this or any previous visit (from the past 240 hour(s)).     Today   Subjective:   Howard [name] today has no headache,no chest abdominal pain,no new weakness tingling or numbness, feels much better wants to go home today.   Objective:   Blood pressure (!) 141/70, pulse 91, temperature 97.7 F (36.5 C), temperature source Oral, resp. rate 20, height 6\' 3"  (1.905 m), weight (!) 156.1 kg (344 lb 1 oz), SpO2 96 %.   Intake/Output Summary (Last 24 hours) at 06/11/16 1035 Last data filed at 06/11/16 1017  Gross per 24 hour  Intake  720 ml  Output             1650 ml  Net             -930 ml    Exam Awake Alert, Oriented x 3, No new F.N deficits, Normal affect [place].AT,PERRAL Supple Neck,No JVD, No cervical lymphadenopathy appriciated.  Symmetrical Chest wall movement, Good air movement bilaterally, CTAB RRR,No Gallops,Rubs or new Murmurs, No Parasternal Heave +ve B.Sounds, Abd Soft, Non tender, No organomegaly appriciated, No rebound -guarding or rigidity. No Cyanosis, Clubbing or edema, No new Rash or bruise  Data Review   CBC w  Diff: Lab Results  Component Value Date   WBC 5.4 06/10/2016   HGB 9.0 (L) 06/10/2016   HGB 14.4 12/25/2014   HCT 27.8 (L) 06/10/2016   HCT 44.7 12/25/2014   PLT 101 (L) 06/10/2016   PLT 155 12/25/2014   LYMPHOPCT 18 06/09/2016   MONOPCT 9 06/09/2016   EOSPCT 3 06/09/2016   BASOPCT 1 06/09/2016    CMP: Lab Results  Component Value Date   NA 141 06/11/2016   NA 141 12/25/2014   K 3.8 06/11/2016   K 3.4 (L) 12/25/2014   CL 105 06/11/2016   CL 100 (L) 12/25/2014   CO2 31 06/11/2016   CO2 30 12/25/2014   BUN 12 06/11/2016   BUN 15 12/25/2014   CREATININE 0.64 06/11/2016   CREATININE 1.23 12/25/2014   PROT 7.6 06/09/2016   PROT 8.5 (H) 12/25/2014   ALBUMIN 3.3 (L) 06/09/2016   ALBUMIN 4.1 12/25/2014   BILITOT 0.8 06/09/2016   BILITOT 0.9 12/25/2014   ALKPHOS 102 06/09/2016   ALKPHOS 148 (H) 12/25/2014   AST 37 06/09/2016   AST 146 (H) 12/25/2014   ALT 27 06/09/2016   ALT 107 (H) 12/25/2014  .   Total Time in preparing paper work, data evaluation and todays exam - 35 minutes  Jermya Dowding M.D on 06/11/2016 at 10:35 AM    Note: This dictation was prepared with Dragon dictation along with smaller phrase technology. Any transcriptional errors that result from this process are unintentional.

## 2016-06-19 ENCOUNTER — Ambulatory Visit (INDEPENDENT_AMBULATORY_CARE_PROVIDER_SITE_OTHER): Payer: Medicare Other | Admitting: Psychiatry

## 2016-06-19 DIAGNOSIS — F200 Paranoid schizophrenia: Secondary | ICD-10-CM

## 2016-06-19 DIAGNOSIS — F1010 Alcohol abuse, uncomplicated: Secondary | ICD-10-CM | POA: Diagnosis not present

## 2016-06-19 MED ORDER — ACAMPROSATE CALCIUM 333 MG PO TBEC: 666.0000 mg | DELAYED_RELEASE_TABLET | Freq: Three times a day (TID) | ORAL | 2 refills | Status: DC

## 2016-06-19 NOTE — Progress Notes (Signed)
Follow-up for patient with schizophrenia but now even more concerning alcohol abuse. He has continued to drink and his health has continued to suffer terribly. He has now started having seizures. Almost certainly related to alcohol abuse but he has been put on anticonvulsant medicine anyway. Complains of feeling tired all the time which is probably from his cirrhosis. He knows that he is killing himself drinking but is continuing to do it. Hallucinations are not so bad. No suicidal ideation.  Casually dressed. Flat affect. Slow speech. Alert and oriented 4. Denies suicidal ideation. Denies acute hallucinations.  He is starting to go to alcoholic's anonymous which I strongly encourage. Once again we reviewed how he is killing himself with his drinking. I am suggesting that we had a camper sake. Even though he is taking a lot of medicine already the alcohol abuse is going to kill him. I instructed him that acamprosate is not a cure for alcohol abuse but can help people to drink less. Reviewed use of the medicine. Orders done to start this at the usual 666 mg 3 times a day. I would like to see him back in 2 months. Continue other medicine as prescribed.

## 2016-06-25 ENCOUNTER — Telehealth: Payer: Self-pay

## 2016-06-25 NOTE — Telephone Encounter (Signed)
melinda called from [facility] [phone].  she stated that patient drinked alcohol yesterday.  states that you had put patient on acamprosate.  please advise.

## 2016-06-25 NOTE — Telephone Encounter (Signed)
Returned her phone call. Anger for the information. No danger to continue taking medication even though he has continued to have some drinking.

## 2016-07-06 ENCOUNTER — Encounter: Payer: Self-pay | Admitting: Emergency Medicine

## 2016-07-06 ENCOUNTER — Inpatient Hospital Stay
Admission: EM | Admit: 2016-07-06 | Discharge: 2016-07-09 | DRG: 441 | Disposition: A | Discharge status: Home / Self Care | Payer: Medicare Other | Attending: Internal Medicine | Admitting: Internal Medicine

## 2016-07-06 DIAGNOSIS — K7290 Hepatic failure, unspecified without coma: Principal | ICD-10-CM | POA: Diagnosis present

## 2016-07-06 DIAGNOSIS — Z811 Family history of alcohol abuse and dependence: Secondary | ICD-10-CM

## 2016-07-06 DIAGNOSIS — I10 Essential (primary) hypertension: Secondary | ICD-10-CM | POA: Diagnosis present

## 2016-07-06 DIAGNOSIS — Z79899 Other long term (current) drug therapy: Secondary | ICD-10-CM

## 2016-07-06 DIAGNOSIS — K7030 Alcoholic cirrhosis of liver without ascites: Secondary | ICD-10-CM | POA: Diagnosis present

## 2016-07-06 DIAGNOSIS — F329 Major depressive disorder, single episode, unspecified: Secondary | ICD-10-CM | POA: Diagnosis present

## 2016-07-06 DIAGNOSIS — Z9119 Patient's noncompliance with other medical treatment and regimen: Secondary | ICD-10-CM

## 2016-07-06 DIAGNOSIS — J449 Chronic obstructive pulmonary disease, unspecified: Secondary | ICD-10-CM | POA: Diagnosis present

## 2016-07-06 DIAGNOSIS — J9601 Acute respiratory failure with hypoxia: Secondary | ICD-10-CM | POA: Diagnosis present

## 2016-07-06 DIAGNOSIS — G40909 Epilepsy, unspecified, not intractable, without status epilepticus: Secondary | ICD-10-CM | POA: Diagnosis present

## 2016-07-06 DIAGNOSIS — R569 Unspecified convulsions: Secondary | ICD-10-CM

## 2016-07-06 DIAGNOSIS — D689 Coagulation defect, unspecified: Secondary | ICD-10-CM | POA: Diagnosis present

## 2016-07-06 DIAGNOSIS — M6281 Muscle weakness (generalized): Secondary | ICD-10-CM

## 2016-07-06 DIAGNOSIS — Z7984 Long term (current) use of oral hypoglycemic drugs: Secondary | ICD-10-CM

## 2016-07-06 DIAGNOSIS — E669 Obesity, unspecified: Secondary | ICD-10-CM | POA: Diagnosis present

## 2016-07-06 DIAGNOSIS — E1165 Type 2 diabetes mellitus with hyperglycemia: Secondary | ICD-10-CM | POA: Diagnosis present

## 2016-07-06 DIAGNOSIS — Z823 Family history of stroke: Secondary | ICD-10-CM

## 2016-07-06 DIAGNOSIS — E785 Hyperlipidemia, unspecified: Secondary | ICD-10-CM | POA: Diagnosis present

## 2016-07-06 DIAGNOSIS — F209 Schizophrenia, unspecified: Secondary | ICD-10-CM | POA: Diagnosis present

## 2016-07-06 DIAGNOSIS — D696 Thrombocytopenia, unspecified: Secondary | ICD-10-CM | POA: Diagnosis present

## 2016-07-06 DIAGNOSIS — Z7982 Long term (current) use of aspirin: Secondary | ICD-10-CM

## 2016-07-06 DIAGNOSIS — R262 Difficulty in walking, not elsewhere classified: Secondary | ICD-10-CM

## 2016-07-06 DIAGNOSIS — F10188 Alcohol abuse with other alcohol-induced disorder: Secondary | ICD-10-CM | POA: Diagnosis present

## 2016-07-06 DIAGNOSIS — J9602 Acute respiratory failure with hypercapnia: Secondary | ICD-10-CM | POA: Diagnosis present

## 2016-07-06 DIAGNOSIS — Z8249 Family history of ischemic heart disease and other diseases of the circulatory system: Secondary | ICD-10-CM

## 2016-07-06 DIAGNOSIS — Z6841 Body Mass Index (BMI) 40.0 and over, adult: Secondary | ICD-10-CM

## 2016-07-06 DIAGNOSIS — K7682 Hepatic encephalopathy: Secondary | ICD-10-CM | POA: Diagnosis present

## 2016-07-06 LAB — COMPREHENSIVE METABOLIC PANEL
ALBUMIN: 3.1 g/dL — AB (ref 3.5–5.0)
ALK PHOS: 99 U/L (ref 38–126)
ALT: 21 U/L (ref 17–63)
ANION GAP: 4 — AB (ref 5–15)
AST: 35 U/L (ref 15–41)
BILIRUBIN TOTAL: 0.4 mg/dL (ref 0.3–1.2)
BUN: 11 mg/dL (ref 6–20)
CALCIUM: 8.2 mg/dL — AB (ref 8.9–10.3)
CO2: 32 mmol/L (ref 22–32)
CREATININE: 0.81 mg/dL (ref 0.61–1.24)
Chloride: 106 mmol/L (ref 101–111)
GFR calc Af Amer: >60 mL/min (ref >60)
GFR calc non Af Amer: >60 mL/min (ref >60)
GLUCOSE: 55 mg/dL — AB (ref 65–99)
Potassium: 3.7 mmol/L (ref 3.5–5.1)
Sodium: 142 mmol/L (ref 135–145)
TOTAL PROTEIN: 7.2 g/dL (ref 6.5–8.1)

## 2016-07-06 LAB — CBC WITH DIFFERENTIAL/PLATELET
BASOS PCT: 1 %
Basophils Absolute: 0.1 10*3/uL (ref 0–0.1)
EOS ABS: 0.2 10*3/uL (ref 0–0.7)
Eosinophils Relative: 3 %
HEMATOCRIT: 27.1 % — AB (ref 40.0–52.0)
HEMOGLOBIN: 8.4 g/dL — AB (ref 13.0–18.0)
LYMPHS PCT: 23 %
Lymphs Abs: 1.5 10*3/uL (ref 1.0–3.6)
MCH: 24.3 pg — ABNORMAL LOW (ref 26.0–34.0)
MCHC: 30.9 g/dL — AB (ref 32.0–36.0)
MCV: 78.8 fL — ABNORMAL LOW (ref 80.0–100.0)
MONO ABS: 0.9 10*3/uL (ref 0.2–1.0)
Monocytes Relative: 13 %
Neutro Abs: 3.9 10*3/uL (ref 1.4–6.5)
Neutrophils Relative %: 60 %
Platelets: 112 10*3/uL — ABNORMAL LOW (ref 150–440)
RBC: 3.43 MIL/uL — AB (ref 4.40–5.90)
RDW: 18.4 % — ABNORMAL HIGH (ref 11.5–14.5)
WBC: 6.5 10*3/uL (ref 3.8–10.6)

## 2016-07-06 LAB — ETHANOL: Alcohol, Ethyl (B): <5 mg/dL (ref <5)

## 2016-07-06 LAB — AMMONIA: Ammonia: 143 umol/L — ABNORMAL HIGH (ref 9–35)

## 2016-07-06 MED ORDER — LACTULOSE 10 GM/15ML PO SOLN
30.0000 g | Freq: Once | ORAL | Status: AC
  Administered 2016-07-06: 30 g via ORAL
  Filled 2016-07-06: qty 60

## 2016-07-06 NOTE — ED Notes (Signed)
Noted the pt's sp02 dropping while he is sleeping. Pt states he is suppose to wear 2liters [place] at night while sleeping. o2 applied

## 2016-07-06 NOTE — ED Provider Notes (Signed)
[facility] Emergency Department Provider Note  ____________________________________________  Time seen: Approximately 9:50 PM  I have reviewed the triage vital signs and the nursing notes.   HISTORY  Chief Complaint Seizures   HPI Howard [name] is a 35 y.o. male history of alcoholic cirrhosis complicated by ascites and esophageal varices, diabetes, COPD, anemia, hypertension, hyperlipidemia who presents for evaluation of seizure episode. Patient endorses compliance with this Keppra. He reports that he is supposed to be taking the lactulose 4 times a day but he tells me "I am lucky if I am able to remember to take it twice a day". Patient was sitting on the couch watching TV when he felt like he was given a have a seizure. The seizure was witnessed by his mother. Patient reports that he was at his baseline before the seizure. He denies any recent illness, fever, chills. He continues to drink and his last drink was 24 hours ago. He does not know if if usually has seizures when he stops drinking. He denies abdominal pain, nausea, vomiting, dysuria, chest pain, shortness of breath.  Past Medical History:  Diagnosis Date  . Alcoholic cirrhosis of liver with ascites (HCC)   . Anemia   . COPD (chronic obstructive pulmonary disease) (HCC)   . Depression   . Diabetes (HCC)   . Diabetes mellitus, type II (HCC)   . Esophageal varices (HCC)   . Heart disease   . Hyperlipemia   . Hypertension   . Liver disease   . Multiple thyroid nodules   . Portal hypertensive gastropathy   . Schizophrenia [facility])     Patient Active Problem List   Diagnosis Date Noted  . Seizure (HCC) 06/10/2016  . Altered mental status 05/12/2016  . Headache 04/29/2016  . OSA (obstructive sleep apnea) 04/29/2016  . Anemia 04/29/2016  . Thrombocytopenia (HCC) 04/29/2016  . Coagulopathy (HCC) 04/29/2016  . Obesity 04/29/2016  . Generalized weakness 04/29/2016  . Acute hepatic encephalopathy  03/14/2016  . Controlled type 2 diabetes mellitus without complication (HCC) 01/15/2016  . Normocytic anemia 12/12/2015  . Essential (primary) hypertension 12/11/2015  . Pure hypercholesterolemia 12/11/2015  . Fever 11/11/2015  . Ascites due to alcoholic cirrhosis (HCC) 11/11/2015  . Cirrhosis of liver with ascites (HCC) 11/11/2015  . Hepatic encephalopathy (HCC) 10/16/2015  . Type 2 diabetes mellitus (HCC) 10/16/2015  . Acute renal failure (ARF) (HCC) 07/30/2015  . 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 Alcoholic cirrhosis (HCC) 07/19/2015  . Hypoxia 07/04/2015  . Elevated transaminase level 07/04/2015  . DOE (dyspnea on exertion) 07/04/2015  . Alcohol abuse 05/31/2015  . Hypertension 05/29/2015  . Paranoid schizophrenia (HCC) 03/20/2015    Past Surgical History:  Procedure Laterality Date  . ESOPHAGOGASTRODUODENOSCOPY N/A 10/05/2015   Procedure: ESOPHAGOGASTRODUODENOSCOPY (EGD);  Surgeon: Elnita Maxwell, MD;  Location[facility] ENDOSCOPY;  Service: Endoscopy;  Laterality: N/A;  . ESOPHAGOGASTRODUODENOSCOPY (EGD) WITH PROPOFOL N/A 08/03/2015   Procedure: ESOPHAGOGASTRODUODENOSCOPY (EGD) WITH PROPOFOL;  Surgeon: Elnita Maxwell, MD;  Location: [facility] ENDOSCOPY;  Service: Endoscopy;  Laterality: N/A;  . ESOPHAGOGASTRODUODENOSCOPY (EGD) WITH PROPOFOL N/A 08/31/2015   Procedure: ESOPHAGOGASTRODUODENOSCOPY (EGD) WITH PROPOFOL;  Surgeon: Elnita Maxwell, MD;  Location: [facility] ENDOSCOPY;  Service: Endoscopy;  Laterality: N/A;  . ESOPHAGOGASTRODUODENOSCOPY (EGD) WITH PROPOFOL N/A 04/04/2016   Procedure: ESOPHAGOGASTRODUODENOSCOPY (EGD) WITH PROPOFOL;  Surgeon: Scot Jun, MD;  Location: [facility] ENDOSCOPY;  Service: Endoscopy;  Laterality: N/A;  . NO PAST SURGERIES  Prior to Admission medications   Medication Sig Start Date End Date Taking? Authorizing Provider  acamprosate (CAMPRAL) 333 MG tablet Take 2 tablets (666 mg total) by mouth 3 (three) times daily  with meals. 06/19/16   Audery Amel, MD  citalopram (CELEXA) 20 MG tablet Take 20 mg by mouth every morning. 04/08/16   Historical Provider, MD  clobetasol cream (TEMOVATE) 0.05 % Apply 1 application topically 2 (two) times daily. 05/22/16 05/22/17  Historical Provider, MD  furosemide (LASIX) 20 MG tablet Take 1 tablet (20 mg total) by mouth daily. 05/15/16   Alford Highland, MD  lactulose (CHRONULAC) 10 GM/15ML solution Take 45 mLs (30 g total) by mouth 4 (four) times daily. 05/15/16   Alford Highland, MD  levETIRAcetam (KEPPRA) 500 MG tablet Take 1 tablet (500 mg total) by mouth 2 (two) times daily. 06/11/16   Katha Hamming, MD  losartan (COZAAR) 100 MG tablet Take 1 tablet (100 mg total) by mouth daily. 05/15/16   Alford Highland, MD  metFORMIN (GLUCOPHAGE) 500 MG tablet Take 1 tablet (500 mg total) by mouth 2 (two) times daily with a meal. 05/15/16   Alford Highland, MD  Multiple Vitamin (MULTIVITAMIN WITH MINERALS) TABS tablet Take 1 tablet by mouth daily. 08/04/15   Ramonita Lab, MD  nadolol (CORGARD) 20 MG tablet Take 20 mg by mouth daily.    Historical Provider, MD  OLANZapine (ZYPREXA) 15 MG tablet Take 2 tablets (30 mg total) by mouth at bedtime. 05/22/16   Audery Amel, MD  omeprazole (PRILOSEC) 40 MG capsule Take 1 capsule (40 mg total) by mouth every morning. 02/12/16   Enid Baas, MD  ondansetron (ZOFRAN) 4 MG tablet Take 4 mg by mouth every 8 (eight) hours as needed for nausea.    Historical Provider, MD  rifaximin (XIFAXAN) 550 MG TABS tablet Take 1 tablet (550 mg total) by mouth 2 (two) times daily. 05/15/16   Alford Highland, MD  spironolactone (ALDACTONE) 25 MG tablet Take 1 tablet (25 mg total) by mouth daily. 05/15/16   Alford Highland, MD  sucralfate (CARAFATE) 1 g tablet Take 1 g by mouth 4 (four) times daily.    Historical Provider, MD    Allergies Review of patient's allergies indicates no known allergies.  Family History  Problem Relation Age of Onset  . Heart  disease Mother   . Hypertension Mother   . Hyperlipidemia Mother   . Stroke Father   . Heart attack Father   . Hypertension Father   . Heart disease Father   . Alcohol abuse Father   . Heart disease Brother     Social History Social History  Substance Use Topics  . Smoking status: Never Smoker  . Smokeless tobacco: Never Used  . Alcohol use 48.0 oz/week    80 Cans of beer per week     Comment: last drink was yesterday-pt says he's a daily drinker    Review of Systems  Constitutional: Negative for fever. Eyes: Negative for visual changes. ENT: Negative for sore throat. Cardiovascular: Negative for chest pain. Respiratory: Negative for shortness of breath. Gastrointestinal: Negative for abdominal pain, vomiting or diarrhea. Genitourinary: Negative for dysuria. Musculoskeletal: Negative for back pain. Skin: Negative for rash. Neurological: Negative for headaches, weakness or numbness. + seizure  ____________________________________________   PHYSICAL EXAM:  VITAL SIGNS: ED Triage Vitals  Enc Vitals Group     BP 07/06/16 2102 118/60     Pulse Rate 07/06/16 2102 74     Resp 07/06/16 2102  20     Temp 07/06/16 2102 98.6 F (37 C)     Temp Source 07/06/16 2102 Oral     SpO2 07/06/16 2102 93 %     Weight 07/06/16 2103 (!) 350 lb (158.8 kg)     Height 07/06/16 2103 6\' 3"  (1.905 m)     Head Circumference --      Peak Flow --      Pain Score 07/06/16 2103 0     Pain Loc --      Pain Edu? --      Excl. in GC? --     Constitutional: Sleepy but arouses, answering to questions appropriately.       Head: Normocephalic and atraumatic.         Eyes: Conjunctivae are normal. Sclera is non-icteric. EOMI. PERRL      Mouth/Throat: Mucous membranes are moist.       Neck: Supple with no signs of meningismus. Cardiovascular: Regular rate and rhythm. No murmurs, gallops, or rubs. 2+ symmetrical distal pulses are present in all extremities. No JVD. Respiratory: Normal respiratory  effort. Lungs are clear to auscultation bilaterally. No wheezes, crackles, or rhonchi.  Gastrointestinal: Obese, mildly ttp throughout, positive bowel sounds. No rebound or guarding. Musculoskeletal: Nontender with normal range of motion in all extremities. No edema, cyanosis, or erythema of extremities. Neurologic: Normal speech and language. Face is symmetric. Moving all extremities. No gross focal neurologic deficits are appreciated. Skin: Jaundice   ____________________________________________   LABS (all labs ordered are listed, but only abnormal results are displayed)  Labs Reviewed  CBC WITH DIFFERENTIAL/PLATELET - Abnormal; Notable for the following:       Result Value   RBC 3.43 (*)    Hemoglobin 8.4 (*)    HCT 27.1 (*)    MCV 78.8 (*)    MCH 24.3 (*)    MCHC 30.9 (*)    RDW 18.4 (*)    Platelets 112 (*)    All other components within normal limits  COMPREHENSIVE METABOLIC PANEL - Abnormal; Notable for the following:    Glucose, Bld 55 (*)    Calcium 8.2 (*)    Albumin 3.1 (*)    Anion gap 4 (*)    All other components within normal limits  AMMONIA - Abnormal; Notable for the following:    Ammonia 143 (*)    All other components within normal limits  ETHANOL  URINALYSIS COMPLETEWITH MICROSCOPIC ([facility] ONLY)  LEVETIRACETAM LEVEL   ____________________________________________  EKG  ED ECG REPORT I, Nita Sickle, the attending physician, personally viewed and interpreted this ECG.  Normal sinus rhythm, rate of 76, normal intervals, normal axis, no ST elevations or depressions.  ____________________________________________  RADIOLOGY  none ____________________________________________   PROCEDURES  Procedure(s) performed: None Procedures Critical Care performed:  None ____________________________________________   INITIAL IMPRESSION / ASSESSMENT AND PLAN / ED COURSE   35 y.o. male history of alcoholic cirrhosis complicated by ascites and  esophageal varices, seizures, diabetes, COPD, anemia, hypertension, hyperlipidemia who presents for evaluation of seizure episode in the setting of noncompliance with his lactulose and continue to drink alcohol although he is currently 24 hours out from his last drink. Patient is sleepy but arouses and answers to questions appropriately, has mildly diffuse tenderness to palpation on his abdomen, vital signs are within normal limits. We'll check labs including ammonia level and Keppra level. If platelets are within normal limits we'll attempt a paracentesis for diagnostic purposes to rule out SBP. Seizure precautions ordered.  Clinical Course   Bedside ultrasound showing no ascites. White count normal at 6.5. Ammonia is elevated at 143. Lactulose has been ordered. Patient continues to have GCS of 15 and maintaining his airway. We'll admit to the hospitalist for hepatic encephalopathy.  Pertinent labs & imaging results that were available during my care of the patient were reviewed by me and considered in my medical decision making (see chart for details).    ____________________________________________   FINAL CLINICAL IMPRESSION(S) / ED DIAGNOSES  Final diagnoses:  Hepatic encephalopathy (HCC)  Seizure (HCC)      NEW MEDICATIONS STARTED DURING THIS VISIT:  New Prescriptions   No medications on file     Note:  This document was prepared using Dragon voice recognition software and may include unintentional dictation errors.    Nita Sicklearolina Shelagh Rayman, MD 07/06/16 2242

## 2016-07-06 NOTE — ED Notes (Signed)
Pt given orange juice and a food tray for low bs

## 2016-07-06 NOTE — ED Triage Notes (Addendum)
Pt brought in POV by mother; pt says he had a seizure about an hour ago; pt says he was sitting when seizure happened and denies injury; having slurred speech; pt says he has had increased ammonia levels before and thinks his levels may be up again; pt says currently he feels "strange"; skin dusky, clammy; pt says he's a daily drinker although "I try not to be"; has not had any alcohol today;

## 2016-07-07 DIAGNOSIS — K7290 Hepatic failure, unspecified without coma: Secondary | ICD-10-CM | POA: Diagnosis present

## 2016-07-07 DIAGNOSIS — F209 Schizophrenia, unspecified: Secondary | ICD-10-CM | POA: Diagnosis present

## 2016-07-07 DIAGNOSIS — E785 Hyperlipidemia, unspecified: Secondary | ICD-10-CM | POA: Diagnosis present

## 2016-07-07 DIAGNOSIS — J449 Chronic obstructive pulmonary disease, unspecified: Secondary | ICD-10-CM | POA: Diagnosis present

## 2016-07-07 DIAGNOSIS — Z811 Family history of alcohol abuse and dependence: Secondary | ICD-10-CM | POA: Diagnosis not present

## 2016-07-07 DIAGNOSIS — D696 Thrombocytopenia, unspecified: Secondary | ICD-10-CM | POA: Diagnosis present

## 2016-07-07 DIAGNOSIS — Z9119 Patient's noncompliance with other medical treatment and regimen: Secondary | ICD-10-CM | POA: Diagnosis not present

## 2016-07-07 DIAGNOSIS — Z7984 Long term (current) use of oral hypoglycemic drugs: Secondary | ICD-10-CM | POA: Diagnosis not present

## 2016-07-07 DIAGNOSIS — G40909 Epilepsy, unspecified, not intractable, without status epilepticus: Secondary | ICD-10-CM | POA: Diagnosis present

## 2016-07-07 DIAGNOSIS — Z8249 Family history of ischemic heart disease and other diseases of the circulatory system: Secondary | ICD-10-CM | POA: Diagnosis not present

## 2016-07-07 DIAGNOSIS — Z79899 Other long term (current) drug therapy: Secondary | ICD-10-CM | POA: Diagnosis not present

## 2016-07-07 DIAGNOSIS — R569 Unspecified convulsions: Secondary | ICD-10-CM | POA: Diagnosis present

## 2016-07-07 DIAGNOSIS — R079 Chest pain, unspecified: Secondary | ICD-10-CM | POA: Diagnosis not present

## 2016-07-07 DIAGNOSIS — F10188 Alcohol abuse with other alcohol-induced disorder: Secondary | ICD-10-CM | POA: Diagnosis present

## 2016-07-07 DIAGNOSIS — J9601 Acute respiratory failure with hypoxia: Secondary | ICD-10-CM | POA: Diagnosis present

## 2016-07-07 DIAGNOSIS — I10 Essential (primary) hypertension: Secondary | ICD-10-CM | POA: Diagnosis present

## 2016-07-07 DIAGNOSIS — E669 Obesity, unspecified: Secondary | ICD-10-CM | POA: Diagnosis present

## 2016-07-07 DIAGNOSIS — D689 Coagulation defect, unspecified: Secondary | ICD-10-CM | POA: Diagnosis present

## 2016-07-07 DIAGNOSIS — Z7982 Long term (current) use of aspirin: Secondary | ICD-10-CM | POA: Diagnosis not present

## 2016-07-07 DIAGNOSIS — F329 Major depressive disorder, single episode, unspecified: Secondary | ICD-10-CM | POA: Diagnosis present

## 2016-07-07 DIAGNOSIS — E1165 Type 2 diabetes mellitus with hyperglycemia: Secondary | ICD-10-CM | POA: Diagnosis present

## 2016-07-07 DIAGNOSIS — Z6841 Body Mass Index (BMI) 40.0 and over, adult: Secondary | ICD-10-CM | POA: Diagnosis not present

## 2016-07-07 DIAGNOSIS — Z823 Family history of stroke: Secondary | ICD-10-CM | POA: Diagnosis not present

## 2016-07-07 DIAGNOSIS — K7030 Alcoholic cirrhosis of liver without ascites: Secondary | ICD-10-CM | POA: Diagnosis present

## 2016-07-07 DIAGNOSIS — J9602 Acute respiratory failure with hypercapnia: Secondary | ICD-10-CM | POA: Diagnosis present

## 2016-07-07 LAB — CBC
HEMATOCRIT: 26 % — AB (ref 40.0–52.0)
HEMOGLOBIN: 7.9 g/dL — AB (ref 13.0–18.0)
MCH: 24.3 pg — AB (ref 26.0–34.0)
MCHC: 30.6 g/dL — AB (ref 32.0–36.0)
MCV: 79.3 fL — ABNORMAL LOW (ref 80.0–100.0)
Platelets: 91 10*3/uL — ABNORMAL LOW (ref 150–440)
RBC: 3.27 MIL/uL — AB (ref 4.40–5.90)
RDW: 17.9 % — ABNORMAL HIGH (ref 11.5–14.5)
WBC: 5 10*3/uL (ref 3.8–10.6)

## 2016-07-07 LAB — COMPREHENSIVE METABOLIC PANEL
ALBUMIN: 3 g/dL — AB (ref 3.5–5.0)
ALT: 20 U/L (ref 17–63)
ANION GAP: 4 — AB (ref 5–15)
AST: 33 U/L (ref 15–41)
Alkaline Phosphatase: 91 U/L (ref 38–126)
BILIRUBIN TOTAL: 0.5 mg/dL (ref 0.3–1.2)
BUN: 12 mg/dL (ref 6–20)
CO2: 32 mmol/L (ref 22–32)
Calcium: 8.3 mg/dL — ABNORMAL LOW (ref 8.9–10.3)
Chloride: 106 mmol/L (ref 101–111)
Creatinine, Ser: 0.82 mg/dL (ref 0.61–1.24)
GLUCOSE: 139 mg/dL — AB (ref 65–99)
POTASSIUM: 3.8 mmol/L (ref 3.5–5.1)
Sodium: 142 mmol/L (ref 135–145)
TOTAL PROTEIN: 7 g/dL (ref 6.5–8.1)

## 2016-07-07 LAB — URINALYSIS COMPLETE WITH MICROSCOPIC (ARMC ONLY)
BACTERIA UA: NONE SEEN
Bilirubin Urine: NEGATIVE
Glucose, UA: NEGATIVE mg/dL
KETONES UR: NEGATIVE mg/dL
LEUKOCYTES UA: NEGATIVE
NITRITE: NEGATIVE
PROTEIN: 30 mg/dL — AB
SPECIFIC GRAVITY, URINE: 1.023 (ref 1.005–1.030)
pH: 5 (ref 5.0–8.0)

## 2016-07-07 LAB — GLUCOSE, CAPILLARY
GLUCOSE-CAPILLARY: 141 mg/dL — AB (ref 65–99)
GLUCOSE-CAPILLARY: 131 mg/dL — AB (ref 65–99)
GLUCOSE-CAPILLARY: 95 mg/dL (ref 65–99)
Glucose-Capillary: 100 mg/dL — ABNORMAL HIGH (ref 65–99)
Glucose-Capillary: 122 mg/dL — ABNORMAL HIGH (ref 65–99)

## 2016-07-07 LAB — PROTIME-INR
INR: 1.28
PROTHROMBIN TIME: 16.1 s — AB (ref 11.4–15.2)

## 2016-07-07 LAB — AMMONIA: Ammonia: 154 umol/L — ABNORMAL HIGH (ref 9–35)

## 2016-07-07 MED ORDER — RIFAXIMIN 550 MG PO TABS
550.0000 mg | ORAL_TABLET | Freq: Two times a day (BID) | ORAL | Status: DC
  Administered 2016-07-07 – 2016-07-09 (×4): 550 mg via ORAL
  Filled 2016-07-07 (×4): qty 1

## 2016-07-07 MED ORDER — ACAMPROSATE CALCIUM 333 MG PO TBEC
666.0000 mg | DELAYED_RELEASE_TABLET | Freq: Three times a day (TID) | ORAL | Status: DC
  Administered 2016-07-07 – 2016-07-09 (×8): 666 mg via ORAL
  Filled 2016-07-07 (×9): qty 2

## 2016-07-07 MED ORDER — OLANZAPINE 10 MG PO TABS
30.0000 mg | ORAL_TABLET | Freq: Every day | ORAL | Status: DC
  Administered 2016-07-07 – 2016-07-08 (×3): 30 mg via ORAL
  Filled 2016-07-07 (×4): qty 3

## 2016-07-07 MED ORDER — ONDANSETRON HCL 4 MG PO TABS: 4.0000 mg | ORAL_TABLET | Freq: Four times a day (QID) | ORAL | Status: DC | PRN

## 2016-07-07 MED ORDER — SUCRALFATE 1 G PO TABS
1.0000 g | ORAL_TABLET | Freq: Three times a day (TID) | ORAL | Status: DC
  Administered 2016-07-07 – 2016-07-09 (×10): 1 g via ORAL
  Filled 2016-07-07 (×10): qty 1

## 2016-07-07 MED ORDER — INSULIN ASPART 100 UNIT/ML ~~LOC~~ SOLN
0.0000 [IU] | Freq: Three times a day (TID) | SUBCUTANEOUS | Status: DC
  Administered 2016-07-07 (×2): 3 [IU] via SUBCUTANEOUS
  Administered 2016-07-09: 4 [IU] via SUBCUTANEOUS
  Filled 2016-07-07: qty 4
  Filled 2016-07-07: qty 2
  Filled 2016-07-07: qty 3

## 2016-07-07 MED ORDER — CITALOPRAM HYDROBROMIDE 20 MG PO TABS
20.0000 mg | ORAL_TABLET | ORAL | Status: DC
  Administered 2016-07-07 – 2016-07-09 (×3): 20 mg via ORAL
  Filled 2016-07-07 (×3): qty 1

## 2016-07-07 MED ORDER — NADOLOL 20 MG PO TABS
20.0000 mg | ORAL_TABLET | Freq: Every day | ORAL | Status: DC
  Administered 2016-07-07 – 2016-07-09 (×3): 20 mg via ORAL
  Filled 2016-07-07 (×3): qty 1

## 2016-07-07 MED ORDER — ADULT MULTIVITAMIN W/MINERALS CH
1.0000 | ORAL_TABLET | Freq: Every day | ORAL | Status: DC
  Administered 2016-07-07 – 2016-07-09 (×3): 1 via ORAL
  Filled 2016-07-07 (×3): qty 1

## 2016-07-07 MED ORDER — LEVETIRACETAM 500 MG PO TABS
500.0000 mg | ORAL_TABLET | Freq: Two times a day (BID) | ORAL | Status: DC
  Administered 2016-07-07 – 2016-07-09 (×6): 500 mg via ORAL
  Filled 2016-07-07 (×6): qty 1

## 2016-07-07 MED ORDER — SPIRONOLACTONE 25 MG PO TABS
50.0000 mg | ORAL_TABLET | Freq: Two times a day (BID) | ORAL | Status: DC
  Administered 2016-07-07 – 2016-07-09 (×5): 50 mg via ORAL
  Filled 2016-07-07 (×5): qty 2

## 2016-07-07 MED ORDER — LOSARTAN POTASSIUM 50 MG PO TABS
25.0000 mg | ORAL_TABLET | Freq: Every day | ORAL | Status: DC
  Administered 2016-07-07 – 2016-07-09 (×3): 25 mg via ORAL
  Filled 2016-07-07 (×3): qty 1

## 2016-07-07 MED ORDER — SODIUM CHLORIDE 0.9% FLUSH: 3.0000 mL | INTRAVENOUS | Status: DC | PRN

## 2016-07-07 MED ORDER — ONDANSETRON HCL 4 MG/2ML IJ SOLN: 4.0000 mg | Freq: Four times a day (QID) | INTRAMUSCULAR | Status: DC | PRN

## 2016-07-07 MED ORDER — SODIUM CHLORIDE 0.9 % IV SOLN: 250.0000 mL | INTRAVENOUS | Status: DC | PRN

## 2016-07-07 MED ORDER — PANTOPRAZOLE SODIUM 40 MG PO TBEC
40.0000 mg | DELAYED_RELEASE_TABLET | Freq: Every day | ORAL | Status: DC
  Administered 2016-07-07 – 2016-07-09 (×3): 40 mg via ORAL
  Filled 2016-07-07 (×3): qty 1

## 2016-07-07 MED ORDER — INSULIN ASPART 100 UNIT/ML ~~LOC~~ SOLN: 0.0000 [IU] | Freq: Every day | SUBCUTANEOUS | Status: DC

## 2016-07-07 MED ORDER — LACTULOSE 10 GM/15ML PO SOLN
30.0000 g | ORAL | Status: DC
  Administered 2016-07-07 – 2016-07-09 (×15): 30 g via ORAL
  Filled 2016-07-07 (×15): qty 60

## 2016-07-07 MED ORDER — SODIUM CHLORIDE 0.9% FLUSH
3.0000 mL | Freq: Two times a day (BID) | INTRAVENOUS | Status: DC
  Administered 2016-07-07 – 2016-07-09 (×6): 3 mL via INTRAVENOUS

## 2016-07-07 MED ORDER — FUROSEMIDE 10 MG/ML IJ SOLN
40.0000 mg | Freq: Two times a day (BID) | INTRAMUSCULAR | Status: DC
  Administered 2016-07-07 (×2): 40 mg via INTRAVENOUS
  Filled 2016-07-07 (×2): qty 4

## 2016-07-07 NOTE — Progress Notes (Signed)
Patient admitted to floor; alert and oriented; denies pain at time; seizure precautions initiated and medication given as ordered; continent of B&B; remains on 2L/; Will continue to monitor.

## 2016-07-07 NOTE — Progress Notes (Signed)
Patient ID: Howard [name] Stotler, male   DOB: 1981-01-25, 35 y.o.   MRN: [id number]  [employer] PROGRESS NOTE  Howard [name] Koop ION:[id number] DOB: 1981-01-25 DOA: 07/06/2016 PCP: Marisue IvanLINTHAVONG, [name], MD  HPI/Subjective: Patient with altered mental status. Patient opens his eyes and falls back asleep easily. Answers [name] few questions. As per the mother, the patient continues to drink alcohol.  Objective: Vitals:   07/07/16 0429 07/07/16 0825  BP: (!) 145/73 (!) 146/74  Pulse: 72 84  Resp: 20 20  Temp: 97.6 F (36.4 C) 98.4 F (36.9 C)    Filed Weights   07/06/16 2103 07/07/16 0141  Weight: (!) 158.8 kg (350 lb) (!) 162.8 kg (359 lb)    ROS: Review of Systems  Unable to perform ROS: Mental status change   Exam: Physical Exam  Constitutional: He appears lethargic.  HENT:  Nose: No mucosal edema.  Mouth/Throat: No oropharyngeal exudate or posterior oropharyngeal edema.  Eyes: Conjunctivae and lids are normal. Pupils are equal, round, and reactive to light.  Neck: No JVD present. Carotid bruit is not present. No edema present. No thyroid mass and no thyromegaly present.  Cardiovascular: S1 normal and S2 normal.  Exam reveals no gallop.   No murmur heard. Pulses:      Dorsalis pedis pulses are 2+ on the right side, and 2+ on the left side.  Respiratory: No respiratory distress. He has no wheezes. He has no rhonchi. He has no rales.  GI: Soft. Bowel sounds are normal. He exhibits distension. There is no tenderness.  Musculoskeletal:       Right ankle: He exhibits swelling.       Left ankle: He exhibits swelling.  Lymphadenopathy:    He has no cervical adenopathy.  Neurological: He appears lethargic.  Skin: Skin is warm. No rash noted. Nails show no clubbing.  Psychiatric: His affect is blunt. He is slowed.      Data Reviewed: Basic Metabolic Panel:  Recent Labs Lab 07/06/16 2140 07/07/16 0436  NA 142 142  K 3.7 3.8  CL 106 106  CO2 32 32  GLUCOSE 55* 139*  BUN 11  12  CREATININE 0.81 0.82  CALCIUM 8.2* 8.3*   Liver Function Tests:  Recent Labs Lab 07/06/16 2140 07/07/16 0436  AST 35 33  ALT 21 20  ALKPHOS 99 91  BILITOT 0.4 0.5  PROT 7.2 7.0  ALBUMIN 3.1* 3.0*    Recent Labs Lab 07/06/16 2140 07/07/16 0436  AMMONIA 143* 154*   CBC:  Recent Labs Lab 07/06/16 2140 07/07/16 0436  WBC 6.5 5.0  NEUTROABS 3.9  --   HGB 8.4* 7.9*  HCT 27.1* 26.0*  MCV 78.8* 79.3*  PLT 112* 91*    CBG:  Recent Labs Lab 07/07/16 0140 07/07/16 0736 07/07/16 1139  GLUCAP 100* 141* 131*    Scheduled Meds: . acamprosate  666 mg Oral TID WC  . citalopram  20 mg Oral [id number]  . furosemide  40 mg Intravenous Q12H  . insulin aspart  0-20 Units Subcutaneous TID WC  . insulin aspart  0-5 Units Subcutaneous QHS  . lactulose  30 g Oral Q4H  . levETIRAcetam  500 mg Oral BID  . losartan  25 mg Oral Daily  . multivitamin with minerals  1 tablet Oral Daily  . nadolol  20 mg Oral Daily  . OLANZapine  30 mg Oral QHS  . pantoprazole  40 mg Oral QAC breakfast  . rifaximin  550 mg Oral BID  .  sodium chloride flush  3 mL Intravenous Q12H  . spironolactone  50 mg Oral BID  . sucralfate  1 g Oral TID AC & HS    Assessment/Plan:  1. Hepatic encephalopathy with an ammonia level of 154. Lactulose 30 g every 4 hours. When I saw him this morning he only had one bowel movement so far. Add Xifaxan. I advised the nurse that if he is unable to take oral lactulose will have to give it rectally. 2. Alcoholic cirrhosis, thrombocytopenia, anemia, coagulopathy. Patient's advised that he slowly killing himself with alcohol. Patient on Lasix, Aldactone and Nadolol. 3. History of esophageal varices on Nadolol. 4. Seizure disorder on Keppra 5. Schizophrenia on olanzapine and Celexa 6. Overall prognosis is poor secondary to poor insight of his disease.  Code Status:     Code Status Orders        Start     Ordered   07/07/16 0140  Full code  Continuous      07/07/16 0140    Code Status History    Date Active Date Inactive Code Status Order ID Comments User Context   06/10/2016  6:21 AM 06/11/2016  8:25 PM Full Code [id number]  Ihor Austin, MD Inpatient   05/12/2016  3:55 AM 05/15/2016  2:57 PM Full Code [id number]  Ihor Austin, MD Inpatient   04/28/2016  2:54 AM 04/29/2016  6:23 PM Full Code [id number]  Alexis Hugelmeyer, DO Inpatient   03/14/2016 12:30 PM 03/16/2016  3:41 PM Full Code [id number]  Shaune Pollack, MD Inpatient   03/03/2016 12:13 PM 03/06/2016  2:26 PM Full Code [id number]  Milagros Loll, MD ED   02/10/2016  5:54 PM 02/12/2016  7:34 PM Full Code [id number]  Ramonita Lab, MD Inpatient   11/11/2015  5:16 AM 11/13/2015  6:00 PM Full Code [id number]  Ihor Austin, MD Inpatient   11/07/2015 10:02 PM 11/09/2015  4:56 PM Full Code [id number]  Enid Baas, MD Inpatient   10/28/2015  8:14 PM 10/29/2015  2:26 PM Full Code [id number]  Altamese Dilling, MD Inpatient   10/16/2015 11:47 PM 10/17/2015  6:00 PM Full Code [id number]  Oralia Manis, MD Inpatient   07/30/2015  5:35 PM 08/04/2015  8:34 PM Full Code [id number]  Enedina Finner, MD Inpatient   07/05/2015  1:16 AM 07/08/2015  7:22 PM Full Code [id number]  Oralia Manis, MD Inpatient   05/30/2015  7:28 PM 06/01/2015  3:36 PM Full Code [id number]  Audery Amel, MD Inpatient     Family Communication: Spoke with mother at the bedside Disposition Plan:  To be determined  Time spent: 35 minutes  Alford Highland  [employer]

## 2016-07-07 NOTE — H&P (Signed)
[facility] [place] at [facility]   PATIENT NAME: Howard BowlerJoey [name]    MR#:  [id number]  DATE OF BIRTH:  29-Jun-1981  DATE OF ADMISSION:  07/06/2016  PRIMARY CARE PHYSICIAN: Marisue IvanLINTHAVONG, [name], MD   REQUESTING/REFERRING PHYSICIAN:   CHIEF COMPLAINT:   Chief Complaint  Patient presents with  . Seizures    HISTORY OF PRESENT ILLNESS: Howard BowlerJoey [name]  is a 35 y.o. male with a known history of Alcoholic cirrhosis of liver, COPD, diabetes mellitus, esophageal varices, hypertension, hyperlipidemia, thyroid nodules, portal hypertension presented to the emergency room with an episode of seizure. Patient was at home watching TV when he had a seizure and was witnessed by his mother. Patient says he has been compliant with his Keppra medication. He still drinks alcohol on a regular basis and has been noncompliant taking his oral lactulose. Patient's says he remembers one day and does not remember other day of taking lactulose. Ammonia level was high during the workup in the emergency room patient was also little lethargic and confused. He was given oral lactulose in the emergency room and hospitalist service was consulted for further care of the patient. No complaints of any chest pain and any shortness of breath. No fever or chills or cough. Bedside ultrasound showed no ascites. Mild abdominal pain noted. No history of any hematemesis or hemoptysis or rectal bleed. Hospitalist service was consulted for further care of the patient.  PAST MEDICAL HISTORY:   Past Medical History:  Diagnosis Date  . Alcoholic cirrhosis of liver with ascites (HCC)   . Anemia   . COPD (chronic obstructive pulmonary disease) (HCC)   . Depression   . Diabetes (HCC)   . Diabetes mellitus, type II (HCC)   . Esophageal varices (HCC)   . Heart disease   . Hyperlipemia   . Hypertension   . Liver disease   . Multiple thyroid nodules   . Portal hypertensive gastropathy   . Schizophrenia (HCC)     PAST  SURGICAL HISTORY: Past Surgical History:  Procedure Laterality Date  . ESOPHAGOGASTRODUODENOSCOPY N/A 10/05/2015   Procedure: ESOPHAGOGASTRODUODENOSCOPY (EGD);  Surgeon: Elnita MaxwellMatthew Gordon Rein, MD;  [facility];  Service: Endoscopy;  Laterality: N/A;  . ESOPHAGOGASTRODUODENOSCOPY (EGD) WITH PROPOFOL N/A 08/03/2015   Procedure: ESOPHAGOGASTRODUODENOSCOPY (EGD) WITH PROPOFOL;  Surgeon: Elnita MaxwellMatthew Gordon Rein, MD;  [facility];  Service: Endoscopy;  Laterality: N/A;  . ESOPHAGOGASTRODUODENOSCOPY (EGD) WITH PROPOFOL N/A 08/31/2015   Procedure: ESOPHAGOGASTRODUODENOSCOPY (EGD) WITH PROPOFOL;  Surgeon: Elnita MaxwellMatthew Gordon Rein, MD;  [facility];  Service: Endoscopy;  Laterality: N/A;  . ESOPHAGOGASTRODUODENOSCOPY (EGD) WITH PROPOFOL N/A 04/04/2016   Procedure: ESOPHAGOGASTRODUODENOSCOPY (EGD) WITH PROPOFOL;  Surgeon: Scot Junobert T Elliott, MD;  [facility];  Service: Endoscopy;  Laterality: N/A;  . NO PAST SURGERIES      SOCIAL HISTORY:  Social History  Substance Use Topics  . Smoking status: Never Smoker  . Smokeless tobacco: Never Used  . Alcohol use 48.0 oz/week    80 Cans of beer per week     Comment: last drink was yesterday-pt says he's a daily drinker    FAMILY HISTORY:  Family History  Problem Relation Age of Onset  . Heart disease Mother   . Hypertension Mother   . Hyperlipidemia Mother   . Stroke Father   . Heart attack Father   . Hypertension Father   . Heart disease Father   . Alcohol abuse Father   . Heart disease Brother     DRUG  ALLERGIES: No Known Allergies  REVIEW OF SYSTEMS:   CONSTITUTIONAL: No fever, fatigue or weakness.  EYES: No blurred or double vision.  EARS, NOSE, AND THROAT: No tinnitus or ear pain.  RESPIRATORY: No cough, shortness of breath, wheezing or hemoptysis.  CARDIOVASCULAR: No chest pain, orthopnea, edema.  GASTROINTESTINAL: No nausea, vomiting, diarrhea  Has mild abdominal pain.  GENITOURINARY: No dysuria,  hematuria.  ENDOCRINE: No polyuria, nocturia,  HEMATOLOGY: No anemia, easy bruising or bleeding SKIN: No rash or lesion. MUSCULOSKELETAL: No joint pain or arthritis.   NEUROLOGIC: No tingling, numbness, weakness.  Had an episode of seizure at home PSYCHIATRY: No anxiety or depression.   MEDICATIONS AT HOME:  Prior to Admission medications   Medication Sig Start Date End Date Taking? Authorizing Provider  acamprosate (CAMPRAL) 333 MG tablet Take 2 tablets (666 mg total) by mouth 3 (three) times daily with meals. 06/19/16  Yes Audery Amel, MD  citalopram (CELEXA) 20 MG tablet Take 20 mg by mouth every morning. 04/08/16  Yes Historical Provider, MD  furosemide (LASIX) 20 MG tablet Take 1 tablet (20 mg total) by mouth daily. 05/15/16  Yes Alford Highland, MD  levETIRAcetam (KEPPRA) 500 MG tablet Take 1 tablet (500 mg total) by mouth 2 (two) times daily. 06/11/16  Yes Katha Hamming, MD  losartan (COZAAR) 25 MG tablet Take 25 mg by mouth daily.   Yes Historical Provider, MD  metFORMIN (GLUCOPHAGE) 500 MG tablet Take 1 tablet (500 mg total) by mouth 2 (two) times daily with a meal. 05/15/16  Yes Alford Highland, MD  Multiple Vitamin (MULTIVITAMIN WITH MINERALS) TABS tablet Take 1 tablet by mouth daily. 08/04/15  Yes Ramonita Lab, MD  nadolol (CORGARD) 20 MG tablet Take 20 mg by mouth daily.   Yes Historical Provider, MD  OLANZapine (ZYPREXA) 15 MG tablet Take 2 tablets (30 mg total) by mouth at bedtime. 05/22/16  Yes Audery Amel, MD  omeprazole (PRILOSEC) 40 MG capsule Take 1 capsule (40 mg total) by mouth every morning. 02/12/16  Yes Enid Baas, MD  sucralfate (CARAFATE) 1 g tablet Take 1 g by mouth 4 (four) times daily.   Yes Historical Provider, MD      PHYSICAL EXAMINATION:   VITAL SIGNS: Blood pressure 136/67, pulse 73, temperature 98.6 F (37 C), temperature source Oral, resp. rate (!) 21, height 6\' 3"  (1.905 m), weight (!) 158.8 kg (350 lb), SpO2 95 %.  GENERAL:  35  y.o.-year-old obese patient lying in the bed with no acute distress.  EYES: Pupils equal, round, reactive to light and accommodation. No scleral icterus. Extraocular muscles intact.  HEENT: Head atraumatic, normocephalic. Oropharynx and nasopharynx clear.  NECK:  Supple, no jugular venous distention. No thyroid enlargement, no tenderness.  LUNGS: Normal breath sounds bilaterally, no wheezing, rales,rhonchi or crepitation. No use of accessory muscles of respiration.  CARDIOVASCULAR: S1, S2 normal. No murmurs, rubs, or gallops.  ABDOMEN: Soft, obese, mild tenderness around umbilicus, nondistended. Bowel sounds present. No organomegaly or mass.  EXTREMITIES: one plus pedal edema, no cyanosis, or clubbing.  NEUROLOGIC: Cranial nerves II through XII are intact. Muscle strength 5/5 in all extremities. Sensation intact. Gait not checked.  PSYCHIATRIC: The patient is alert and oriented x 3.  SKIN: No obvious rash, lesion, or ulcer.   LABORATORY PANEL:   CBC  Recent Labs Lab 07/06/16 2140  WBC 6.5  HGB 8.4*  HCT 27.1*  PLT 112*  MCV 78.8*  MCH 24.3*  MCHC 30.9*  RDW 18.4*  LYMPHSABS 1.5  MONOABS 0.9  EOSABS 0.2  BASOSABS 0.1   ------------------------------------------------------------------------------------------------------------------  Chemistries   Recent Labs Lab 07/06/16 2140  NA 142  K 3.7  CL 106  CO2 32  GLUCOSE 55*  BUN 11  CREATININE 0.81  CALCIUM 8.2*  AST 35  ALT 21  ALKPHOS 99  BILITOT 0.4   ------------------------------------------------------------------------------------------------------------------ estimated creatinine clearance is 201.7 mL/min (by C-G formula based on SCr of 0.81 mg/dL). ------------------------------------------------------------------------------------------------------------------ No results for input(s): TSH, T4TOTAL, T3FREE, THYROIDAB in the last 72 hours.  Invalid input(s): FREET3   Coagulation profile No results for  input(s): INR, PROTIME in the last 168 hours. ------------------------------------------------------------------------------------------------------------------- No results for input(s): DDIMER in the last 72 hours. -------------------------------------------------------------------------------------------------------------------  Cardiac Enzymes No results for input(s): CKMB, TROPONINI, MYOGLOBIN in the last 168 hours.  Invalid input(s): CK ------------------------------------------------------------------------------------------------------------------ Invalid input(s): POCBNP  ---------------------------------------------------------------------------------------------------------------  Urinalysis    Component Value Date/Time   COLORURINE YELLOW (A) 07/06/2016 2345   APPEARANCEUR CLEAR (A) 07/06/2016 2345   APPEARANCEUR Clear 12/25/2014 1404   LABSPEC 1.023 07/06/2016 2345   LABSPEC 1.016 12/25/2014 1404   PHURINE 5.0 07/06/2016 2345   GLUCOSEU NEGATIVE 07/06/2016 2345   GLUCOSEU Negative 12/25/2014 1404   HGBUR 2+ (A) 07/06/2016 2345   BILIRUBINUR NEGATIVE 07/06/2016 2345   BILIRUBINUR Negative 12/25/2014 1404   KETONESUR NEGATIVE 07/06/2016 2345   PROTEINUR 30 (A) 07/06/2016 2345   NITRITE NEGATIVE 07/06/2016 2345   LEUKOCYTESUR NEGATIVE 07/06/2016 2345   LEUKOCYTESUR Negative 12/25/2014 1404     RADIOLOGY: No results found.  EKG: Orders placed or performed during the hospital encounter of 07/06/16  . EKG 12-Lead  . EKG 12-Lead    IMPRESSION AND PLAN: 35 year old male patient with history of alcoholic liver disease, cirrhosis of liver, portal hypertension, esophageal varices, COPD, diabetes mellitus presented to emergency room with an episode of seizure. During the workup ammonia level was elevated. Admitting diagnosis 1. Hepatic encephalopathy 2. Breakthrough seizure 3. Alcohol abuse 4. Cirrhosis of liver 5. Type 2 diabetes mellitus 6. COPD Treatment  plan Admit patient to medical floor inpatient service Oral lactulose every 4 hourly Follow-up ammonia level Resume Keppra for seizure disorder All old cessation counseled to the patient Medical management of diabetes mellitus to continue DVT prophylaxis with sequential compression devices to lower extremities Supportive care.  All the records are reviewed and case discussed with ED provider. Management plans discussed with the patient, family and they are in agreement.  CODE STATUS:FULL Code Status History    Date Active Date Inactive Code Status Order ID Comments User Context   06/10/2016  6:21 AM 06/11/2016  8:25 PM Full Code [id number]  Ihor Austin, MD Inpatient   05/12/2016  3:55 AM 05/15/2016  2:57 PM Full Code [id number]  Ihor Austin, MD Inpatient   04/28/2016  2:54 AM 04/29/2016  6:23 PM Full Code [id number]  Tonye Royalty, DO Inpatient   03/14/2016 12:30 PM 03/16/2016  3:41 PM Full Code [id number]  Shaune Pollack, MD Inpatient   03/03/2016 12:13 PM 03/06/2016  2:26 PM Full Code [id number]  Milagros Loll, MD ED   02/10/2016  5:54 PM 02/12/2016  7:34 PM Full Code [id number]  Ramonita Lab, MD Inpatient   11/11/2015  5:16 AM 11/13/2015  6:00 PM Full Code [id number]  Ihor Austin, MD Inpatient   11/07/2015 10:02 PM 11/09/2015  4:56 PM Full Code [id number]  Enid Baas, MD Inpatient   10/28/2015  8:14 PM 10/29/2015  2:26 PM Full Code [id number]  Altamese Dilling, MD Inpatient   10/16/2015 11:47 PM  10/17/2015  6:00 PM Full Code [id number]  Oralia Manis, MD Inpatient   07/30/2015  5:35 PM 08/04/2015  8:34 PM Full Code [id number]  Enedina Finner, MD Inpatient   07/05/2015  1:16 AM 07/08/2015  7:22 PM Full Code [id number]  Oralia Manis, MD Inpatient   05/30/2015  7:28 PM 06/01/2015  3:36 PM Full Code [id number]  Audery Amel, MD Inpatient       TOTAL TIME TAKING CARE OF THIS PATIENT: 53 minutes.    Ihor Austin M.D on 07/07/2016 at 12:16 AM  Between 7am to 6pm - Pager - [phone]  After 6pm go to  [email or web address] - password [email or web address] [facility]  [facility]  Office  [phone]  CC: Primary care physician; Marisue Ivan, MD

## 2016-07-07 NOTE — Care Management Note (Signed)
Case Management Note  Patient Details  Name: Howard [name] MRN: [id number] Date of Birth: 02-08-1981  Subjective/Objective:       35yo Mr Howard [name] was admitted per falls and seizure activity at home. Resides with his Mother. Hx: Cirrhosis of Liver, Esophageal varices, Schizophrenia, DM, HTN, COPD. Chronic home oxygen supplied by [facility]. Currently receiving RN and PT services from [facility] PCP=Dr [name]. Pharmacy=[facility]. Home assistive equipment includes a RW, and an [employer] wheelchair.  Case management will follow for discharge planning.              Action/Plan:   Expected Discharge Date:                  Expected Discharge Plan:     In-House Referral:     Discharge planning Services     Post Acute Care Choice:    Choice offered to:     DME Arranged:    DME Agency:     HH Arranged:    HH Agency:     Status of Service:     If discussed at [employer] Length of Stay Meetings, dates discussed:    Additional Comments:  Dariya Gainer A, RN 07/07/2016, 9:42 AM

## 2016-07-08 LAB — CBC
HEMATOCRIT: 27.2 % — AB (ref 40.0–52.0)
HEMOGLOBIN: 8.4 g/dL — AB (ref 13.0–18.0)
MCH: 24.2 pg — ABNORMAL LOW (ref 26.0–34.0)
MCHC: 31 g/dL — AB (ref 32.0–36.0)
MCV: 78 fL — ABNORMAL LOW (ref 80.0–100.0)
Platelets: 93 10*3/uL — ABNORMAL LOW (ref 150–440)
RBC: 3.48 MIL/uL — AB (ref 4.40–5.90)
RDW: 17.8 % — ABNORMAL HIGH (ref 11.5–14.5)
WBC: 4.7 10*3/uL (ref 3.8–10.6)

## 2016-07-08 LAB — GLUCOSE, CAPILLARY
GLUCOSE-CAPILLARY: 100 mg/dL — AB (ref 65–99)
GLUCOSE-CAPILLARY: 83 mg/dL (ref 65–99)
GLUCOSE-CAPILLARY: 117 mg/dL — AB (ref 65–99)
Glucose-Capillary: 100 mg/dL — ABNORMAL HIGH (ref 65–99)

## 2016-07-08 LAB — BASIC METABOLIC PANEL
ANION GAP: 7 (ref 5–15)
BUN: 11 mg/dL (ref 6–20)
CO2: 36 mmol/L — AB (ref 22–32)
Calcium: 8.8 mg/dL — ABNORMAL LOW (ref 8.9–10.3)
Chloride: 99 mmol/L — ABNORMAL LOW (ref 101–111)
Creatinine, Ser: 0.76 mg/dL (ref 0.61–1.24)
GFR calc Af Amer: >60 mL/min (ref >60)
GLUCOSE: 116 mg/dL — AB (ref 65–99)
POTASSIUM: 3.5 mmol/L (ref 3.5–5.1)
Sodium: 142 mmol/L (ref 135–145)

## 2016-07-08 LAB — AMMONIA: Ammonia: 86 umol/L — ABNORMAL HIGH (ref 9–35)

## 2016-07-08 MED ORDER — FUROSEMIDE 40 MG PO TABS
40.0000 mg | ORAL_TABLET | Freq: Every day | ORAL | Status: DC
  Administered 2016-07-08 – 2016-07-09 (×2): 40 mg via ORAL
  Filled 2016-07-08 (×2): qty 1

## 2016-07-08 NOTE — Progress Notes (Signed)
Pt was prone in bed under several blankets to stay warm.  Hopes to go home soon but still slurring his words.  CH offered prayer for Howard [name]. Mother in room also asked for prayer - CH offered second prayer.   07/08/16 1100  Clinical Encounter Type  Visited With Patient and family together  Visit Type Initial  Referral From Nurse  Spiritual Encounters  Spiritual Needs Prayer;Emotional

## 2016-07-08 NOTE — Progress Notes (Signed)
MD instructed primary nurse to change lactulose order to 4 times a day once the patient has 3rd bowel movement. At this time, patient has not move bowels since the doctor saw patient.

## 2016-07-08 NOTE — Progress Notes (Signed)
Patient ID: Howard [name], male   DOB: [birth year], 35 y.o.   MRN: [id number]   [facility] PROGRESS NOTE  Howard [name] Espin [id number] DOB: [birth year] DOA: 07/06/2016 PCP: Marisue IvanLINTHAVONG, [name], MD  HPI/Subjective: Patient's mental status is better today than yesterday. Still [name] little slurred with his speech. Abdomen still distended.  Objective: Vitals:   07/07/16 2012 07/08/16 0441  BP: (!) 144/82 (!) 142/79  Pulse: 78 74  Resp: 20 18  Temp: 98.2 F (36.8 C) 98 F (36.7 C)    Filed Weights   07/06/16 2103 07/07/16 0141  Weight: (!) 158.8 kg (350 lb) (!) 162.8 kg (359 lb)    ROS: Review of Systems  Constitutional: Negative for chills and fever.  Eyes: Negative for blurred vision.  Respiratory: Positive for shortness of breath. Negative for cough.   Cardiovascular: Negative for chest pain.  Gastrointestinal: Positive for diarrhea. Negative for abdominal pain, constipation, nausea and vomiting.  Genitourinary: Negative for dysuria.  Musculoskeletal: Negative for joint pain.  Neurological: Negative for dizziness and headaches.   Exam: Physical Exam  HENT:  Nose: No mucosal edema.  Mouth/Throat: No oropharyngeal exudate or posterior oropharyngeal edema.  Eyes: Conjunctivae and lids are normal. Pupils are equal, round, and reactive to light.  Neck: No JVD present. Carotid bruit is not present. No edema present. No thyroid mass and no thyromegaly present.  Cardiovascular: S1 normal and S2 normal.  Exam reveals no gallop.   No murmur heard. Pulses:      Dorsalis pedis pulses are 2+ on the right side, and 2+ on the left side.  Respiratory: Accessory muscle usage present. He has decreased breath sounds in the right lower field and the left lower field. He has no wheezes. He has no rhonchi. He has no rales.  GI: Soft. Bowel sounds are normal. He exhibits distension. There is no tenderness.  Musculoskeletal:       Right ankle: He exhibits swelling.       Left ankle: He  exhibits swelling.  Lymphadenopathy:    He has no cervical adenopathy.  Neurological: He is alert.  Skin: Skin is warm. No rash noted. Nails show no clubbing.  Psychiatric: He has [name] normal mood and affect. His speech is slurred. He is slowed.      Data Reviewed: Basic Metabolic Panel:  Recent Labs Lab 07/06/16 2140 07/07/16 0436 07/08/16 0523  NA 142 142 142  K 3.7 3.8 3.5  CL 106 106 99*  CO2 32 32 36*  GLUCOSE 55* 139* 116*  BUN 11 12 11   CREATININE 0.81 0.82 0.76  CALCIUM 8.2* 8.3* 8.8*   Liver Function Tests:  Recent Labs Lab 07/06/16 2140 07/07/16 0436  AST 35 33  ALT 21 20  ALKPHOS 99 91  BILITOT 0.4 0.5  PROT 7.2 7.0  ALBUMIN 3.1* 3.0*    Recent Labs Lab 07/06/16 2140 07/07/16 0436 07/08/16 0523  AMMONIA 143* 154* 86*   CBC:  Recent Labs Lab 07/06/16 2140 07/07/16 0436 07/08/16 0523  WBC 6.5 5.0 4.7  NEUTROABS 3.9  --   --   HGB 8.4* 7.9* 8.4*  HCT 27.1* 26.0* 27.2*  MCV 78.8* 79.3* 78.0*  PLT 112* 91* 93*    CBG:  Recent Labs Lab 07/07/16 1139 07/07/16 1628 07/07/16 2108 07/08/16 0737 07/08/16 1152  GLUCAP 131* 95 122* 100* 100*    Scheduled Meds: . acamprosate  666 mg Oral TID WC  . citalopram  20 mg Oral BH-q7a  . furosemide  40  mg Oral Daily  . insulin aspart  0-20 Units Subcutaneous TID WC  . insulin aspart  0-5 Units Subcutaneous QHS  . lactulose  30 g Oral Q4H  . levETIRAcetam  500 mg Oral BID  . losartan  25 mg Oral Daily  . multivitamin with minerals  1 tablet Oral Daily  . nadolol  20 mg Oral Daily  . OLANZapine  30 mg Oral QHS  . pantoprazole  40 mg Oral QAC breakfast  . rifaximin  550 mg Oral BID  . sodium chloride flush  3 mL Intravenous Q12H  . spironolactone  50 mg Oral BID  . sucralfate  1 g Oral TID AC & HS    Assessment/Plan:  1. Hepatic encephalopathy with an ammonia level of 154. Lactulose 30 g every 4 hours Until 3 bowel movements today and then can change to every 6 hours.  Added Xifaxan, care  manager to look into cost for this. Since the patient is noncompliant with his lactulose the Xifaxan will be [name] good medication to prevent them from coming into the hospital..  2. Alcoholic cirrhosis, thrombocytopenia, anemia, coagulopathy. Patient's advised that he slowly killing himself with alcohol. Patient on Lasix, Aldactone and Nadolol. 3. History of esophageal varices on Nadolol. 4. Seizure disorder on Keppra 5. Schizophrenia on olanzapine and Celexa 6. Overall prognosis is poor secondary to poor insight of his disease.  Code Status:     Code Status Orders        Start     Ordered   07/07/16 0140  Full code  Continuous     07/07/16 0140    Code Status History    Date Active Date Inactive Code Status Order ID Comments User Context   06/10/2016  6:21 AM 06/11/2016  8:25 PM Full Code [id number]  Ihor Austin, MD Inpatient   05/12/2016  3:55 AM 05/15/2016  2:57 PM Full Code [id number]  Ihor Austin, MD Inpatient   04/28/2016  2:54 AM 04/29/2016  6:23 PM Full Code [id number]  Alexis Hugelmeyer, DO Inpatient   03/14/2016 12:30 PM 03/16/2016  3:41 PM Full Code [id number]  Shaune Pollack, MD Inpatient   03/03/2016 12:13 PM 03/06/2016  2:26 PM Full Code [id number]  Milagros Loll, MD ED   02/10/2016  5:54 PM 02/12/2016  7:34 PM Full Code [id number]  Ramonita Lab, MD Inpatient   11/11/2015  5:16 AM 11/13/2015  6:00 PM Full Code [id number]  Ihor Austin, MD Inpatient   11/07/2015 10:02 PM 11/09/2015  4:56 PM Full Code [id number]  Enid Baas, MD Inpatient   10/28/2015  8:14 PM 10/29/2015  2:26 PM Full Code [id number]  Altamese Dilling, MD Inpatient   10/16/2015 11:47 PM 10/17/2015  6:00 PM Full Code [id number]  Oralia Manis, MD Inpatient   07/30/2015  5:35 PM 08/04/2015  8:34 PM Full Code [id number]  Enedina Finner, MD Inpatient   07/05/2015  1:16 AM 07/08/2015  7:22 PM Full Code [id number]  Oralia Manis, MD Inpatient   05/30/2015  7:28 PM 06/01/2015  3:36 PM Full Code [id number]  Audery Amel, MD Inpatient     Family  Communication: Spoke with mother Yesterday Disposition Plan:  Potential disposition tomorrow depending on clinical course  Time spent: 25 minutes  Alford Highland  [employer]

## 2016-07-08 NOTE — Evaluation (Signed)
Physical Therapy Evaluation Patient Details Name: Howard [name] MRN: [id number] DOB: 15-Nov-1980 Today's Date: 07/08/2016   History of Present Illness  35 y.o. male with a known history of Alcoholic cirrhosis of liver, COPD, diabetes mellitus, esophageal varices, hypertension, hyperlipidemia, thyroid nodules, portal hypertension presented to the emergency room with an episode of seizure. Patient was at home watching TV when he had a seizure and was witnessed by his mother. Patient says he has been compliant with his Keppra medication. He still drinks alcohol on a regular basis and has been noncompliant taking his oral lactulose.  Pt admitted with hepatic encephalopathy.  Clinical Impression  Pt generally was able to participate with PT exam w/o too much issue.  He was able to ambulate around the nurses' station with slow but consistent and relatively safe gait using QC.  He reports that he uses an electric w/c most of the time outside the home but wants to work back to increased activity.  Pt able to ambulate safely but slowly.  Overall is weak and fatigues with prolonged ambulation but ultimately did well with reasonable safety.      Follow Up Recommendations Home health PT    Equipment Recommendations       Recommendations for Other Services       Precautions / Restrictions Precautions Precautions: Fall Restrictions Weight Bearing Restrictions: No      Mobility  Bed Mobility Overal bed mobility: Independent             General bed mobility comments: Pt able to get up to sitting EOB w/o direct assist  Transfers Overall transfer level: Independent Equipment used: Human resources officer transfer comment: Pt slow and cautious with rising to standing but does not need direct assist  Ambulation/Gait Ambulation/Gait assistance: Min guard Ambulation Distance (Feet): 200 Feet Assistive device: Quad cane       General Gait Details: Pt does have some initial  dizziness/unsteadiness and needed 2 brief standing rest breaks but ultimately he was able to maintain slow but consistent ambulation with QC and had no overt safety issues.  On 2 liters O2 t/o the effort, stats remain in the mid/low 90s.  Stairs            Wheelchair Mobility    Modified Rankin (Stroke Patients Only)       Balance Overall balance assessment: Modified Independent                                           Pertinent Vitals/Pain Pain Assessment:  (general diffuse head ache)    Home Living Family/patient expects to be discharged to:: Private residence Living Arrangements: Parent;Other relatives (brother) Available Help at Discharge: Family;Available 24 hours/day Type of Home: House Home Access: Stairs to enter Entrance Stairs-Rails: Right;Left;Can reach both Entrance Stairs-Number of Steps: 6 Home Layout: One level Home Equipment: Walker - 2 wheels;Cane - quad;Wheelchair - power      Prior Function Level of Independence: Independent with assistive device(s)         Comments: Pt uses QC most of the time in the house, uses w/c outside of home - apparently OOH almost daily?     Hand Dominance        Extremity/Trunk Assessment   Upper Extremity Assessment: Overall WFL for tasks assessed (grossly weak for age appropriate  normals)           Lower Extremity Assessment: Overall WFL for tasks assessed (grossly weak for age appropriate normals)         Communication      Cognition Arousal/Alertness: Awake/alert Behavior During Therapy: WFL for tasks assessed/performed Overall Cognitive Status: Within Functional Limits for tasks assessed                      General Comments      Exercises     Assessment/Plan    PT Assessment Patient needs continued PT services  PT Problem List Decreased strength;Decreased range of motion;Decreased activity tolerance;Decreased balance;Decreased safety awareness;Decreased  knowledge of use of DME          PT Treatment Interventions DME instruction;Gait training;Stair training;Functional mobility training;Therapeutic activities;Therapeutic exercise;Balance training    PT Goals (Current goals can be found in the Care Plan section)  Acute Rehab PT Goals Patient Stated Goal: get stronger  PT Goal Formulation: With patient Time For Goal Achievement: 07/22/16 Potential to Achieve Goals: Fair    Frequency Min 2X/week   Barriers to discharge        Co-evaluation               End of Session Equipment Utilized During Treatment: Gait belt;Oxygen (2 liters) Activity Tolerance: Patient limited by fatigue Patient left: with call bell/phone within reach;with bed alarm set           Time: [id number] PT Time Calculation (min) (ACUTE ONLY): 21 min   Charges:   PT Evaluation $PT Eval Low Complexity: 1 Procedure     PT G Codes:        Malachi ProGalen R Kensy Blizard, DPT 07/08/2016, 10:48 AM

## 2016-07-08 NOTE — Clinical Social Work Note (Signed)
CSW consulted for discharge planning. PT is recommending returning home with home health. RN CM is following and making these arrangements. York SpanielMonica Izekiel Flegel MSW,LCSW [phone]

## 2016-07-08 NOTE — Progress Notes (Signed)
Chaplain rounded Pt, who appeared to be depressed. Pt talked about his father and brother who are both deceased. He mentioned that he started drinking right after his brother died in 2003, and had tried several times to stop drinking but has not be easy for him. He knows that drinking has caused some of the health problems he is having and needs help to stop drinking. Chaplain suggests Pt to be referred to AA or other organizations that help alcohol addicts to help him with this problem. Chaplain provided prayer and presence.    07/08/16 1800  Clinical Encounter Type  Visited With Patient  Visit Type Initial  Referral From Nurse  Spiritual Encounters  Spiritual Needs Prayer;Emotional  Stress Factors  Patient Stress Factors Health changes  Family Stress Factors Major life changes

## 2016-07-09 ENCOUNTER — Inpatient Hospital Stay (HOSPITAL_COMMUNITY)
Admit: 2016-07-09 | Discharge: 2016-07-09 | Disposition: A | Discharge status: Home / Self Care | Payer: Medicare Other | Attending: Internal Medicine | Admitting: Internal Medicine

## 2016-07-09 DIAGNOSIS — R079 Chest pain, unspecified: Secondary | ICD-10-CM

## 2016-07-09 LAB — BASIC METABOLIC PANEL
Anion gap: 4 — ABNORMAL LOW (ref 5–15)
BUN: 14 mg/dL (ref 6–20)
CO2: 37 mmol/L — ABNORMAL HIGH (ref 22–32)
CREATININE: 0.73 mg/dL (ref 0.61–1.24)
Calcium: 9 mg/dL (ref 8.9–10.3)
Chloride: 101 mmol/L (ref 101–111)
GFR calc Af Amer: >60 mL/min (ref >60)
GLUCOSE: 111 mg/dL — AB (ref 65–99)
Potassium: 3.9 mmol/L (ref 3.5–5.1)
SODIUM: 142 mmol/L (ref 135–145)

## 2016-07-09 LAB — BLOOD GAS, ARTERIAL
ACID-BASE EXCESS: 13.3 mmol/L — AB (ref 0.0–2.0)
BICARBONATE: 40.7 mmol/L — AB (ref 20.0–28.0)
FIO2: 0.28
O2 Saturation: 92.2 %
PCO2 ART: 72 mmHg — AB (ref 32.0–48.0)
PH ART: 7.36 (ref 7.350–7.450)
Patient temperature: 37
pO2, Arterial: 67 mmHg — ABNORMAL LOW (ref 83.0–108.0)

## 2016-07-09 LAB — GLUCOSE, CAPILLARY
GLUCOSE-CAPILLARY: 98 mg/dL (ref 65–99)
Glucose-Capillary: 171 mg/dL — ABNORMAL HIGH (ref 65–99)

## 2016-07-09 LAB — ECHOCARDIOGRAM COMPLETE
Height: 75 in
Weight: 5744 oz

## 2016-07-09 LAB — AMMONIA: AMMONIA: 73 umol/L — AB (ref 9–35)

## 2016-07-09 LAB — LEVETIRACETAM LEVEL: Levetiracetam Lvl: 13.8 ug/mL (ref 10.0–40.0)

## 2016-07-09 MED ORDER — RIFAXIMIN 550 MG PO TABS: 550.0000 mg | ORAL_TABLET | Freq: Two times a day (BID) | ORAL | 5 refills | Status: DC

## 2016-07-09 MED ORDER — SPIRONOLACTONE 50 MG PO TABS: 50.0000 mg | ORAL_TABLET | Freq: Two times a day (BID) | ORAL | 5 refills | Status: DC

## 2016-07-09 MED ORDER — LACTULOSE 10 GM/15ML PO SOLN: 30.0000 g | ORAL | 0 refills | Status: DC

## 2016-07-09 MED ORDER — LEVETIRACETAM 1000 MG PO TABS: 1000.0000 mg | ORAL_TABLET | Freq: Two times a day (BID) | ORAL | 5 refills | Status: DC

## 2016-07-09 NOTE — Progress Notes (Signed)
*  PRELIMINARY RESULTS* Echocardiogram 2D Echocardiogram has been performed.  Howard [name], Howard [name] 07/09/2016, 1:14 PM

## 2016-07-09 NOTE — Discharge Summary (Signed)
[facility] -  at [facility]   PATIENT NAME: Howard [name]    MR#:  [id number]  DATE OF BIRTH:  11/04/1980  DATE OF ADMISSION:  07/06/2016 ADMITTING PHYSICIAN: Ihor Austin, MD  DATE OF DISCHARGE: No discharge date for patient encounter.  PRIMARY CARE PHYSICIAN: Marisue Ivan, MD     ADMISSION DIAGNOSIS:  Hepatic encephalopathy (HCC) [K72.90] Seizure (HCC) [R56.9]  DISCHARGE DIAGNOSIS:  Principal Problem:   Hepatic encephalopathy (HCC) Active Problems:   Alcoholic cirrhosis of liver (HCC)   Acute respiratory failure with hypercapnia (HCC)   Hyperglycemia   SECONDARY DIAGNOSIS:   Past Medical History:  Diagnosis Date  . Alcoholic cirrhosis of liver with ascites (HCC)   . Anemia   . COPD (chronic obstructive pulmonary disease) (HCC)   . Depression   . Diabetes (HCC)   . Diabetes mellitus, type II (HCC)   . Esophageal varices (HCC)   . Heart disease   . Hyperlipemia   . Hypertension   . Liver disease   . Multiple thyroid nodules   . Portal hypertensive gastropathy   . Schizophrenia (HCC)     .pro HOSPITAL COURSE:   The patient is a 35 year old Caucasian male with a history significant for history of alcoholic liver cirrhosis with ascites, ongoing alcohol abuse, thrombocytopenia, COPD, depression, diabetes, esophageal varices, hyperlipidemia, hypertension, schizophrenia, who presents to the hospital with complaints of seizure. Upper lip. Patient was watching TV, when he had a seizure, which was witnessed by his mother. He reported compliance with Keppra, however, he has been noncompliant taking his oral lactulose. Labs in the emergency room revealed markedly elevated ammonia level to 154. Patient was admitted to the hospital. Keppra was advanced to 1 g twice daily dose. The patient was initiated on Xifaxan and his condition improved. However, intermittently while in the hospital patient was noted to have significant somnolence, at  that point. ABGs were performed revealing significant CO2 retention , PCO2 of 72, normal pH of 7.36. It was felt that patient needs to be followed by a pulmonologist,  to have sleep study as outpatient, to qualify for CPAP/BiPAP as outpatient, if needed. Discussion by problem: #1. Seizures, now on advanced Keppra dose at 1 g twice daily, patient was advised to follow-up with primary neurologist as outpatient #2. Hepatic encephalopathy, now on Xifaxan and lactulose, patient was advised to continue lactulose and have at least 3 or 4 bowel movements a day #3, hypercapnia, patient is to follow up with pulmonologist as outpatient, have sleep study done as outpatient, also qualify for CPAP or BiPAP as outpatient #4. Alcoholic liver disease, patient has very poor insight and continues to drink alcohol, unfortunately, patient is at risk of deterioration . If unable to stop drinking #5 schizophrenia, patient is to continue his outpatient medications, and follow-up with primary psychiatrist for further recommendations.   DISCHARGE CONDITIONS:   Stable  CONSULTS OBTAINED:    DRUG ALLERGIES:  No Known Allergies  DISCHARGE MEDICATIONS:   Current Discharge Medication List    START taking these medications   Details  lactulose (CHRONULAC) 10 GM/15ML solution Take 45 mLs (30 g total) by mouth every 4 (four) hours. Qty: 240 mL, Refills: 0    rifaximin (XIFAXAN) 550 MG TABS tablet Take 1 tablet (550 mg total) by mouth 2 (two) times daily. Qty: 60 tablet, Refills: 5    spironolactone (ALDACTONE) 50 MG tablet Take 1 tablet (50 mg total) by mouth 2 (two) times daily. Qty: 60 tablet, Refills: 5  CONTINUE these medications which have CHANGED   Details  levETIRAcetam (KEPPRA) 1000 MG tablet Take 1 tablet (1,000 mg total) by mouth 2 (two) times daily. Qty: 60 tablet, Refills: 5      CONTINUE these medications which have NOT CHANGED   Details  acamprosate (CAMPRAL) 333 MG tablet Take 2 tablets  (666 mg total) by mouth 3 (three) times daily with meals. Qty: 180 tablet, Refills: 2    citalopram (CELEXA) 20 MG tablet Take 20 mg by mouth every morning.    furosemide (LASIX) 20 MG tablet Take 1 tablet (20 mg total) by mouth daily. Qty: 30 tablet, Refills: 0    losartan (COZAAR) 25 MG tablet Take 25 mg by mouth daily.    metFORMIN (GLUCOPHAGE) 500 MG tablet Take 1 tablet (500 mg total) by mouth 2 (two) times daily with a meal. Qty: 60 tablet, Refills: 0    Multiple Vitamin (MULTIVITAMIN WITH MINERALS) TABS tablet Take 1 tablet by mouth daily. Qty: 30 tablet, Refills: 0    nadolol (CORGARD) 20 MG tablet Take 20 mg by mouth daily.    OLANZapine (ZYPREXA) 15 MG tablet Take 2 tablets (30 mg total) by mouth at bedtime. Qty: 60 tablet, Refills: 5    omeprazole (PRILOSEC) 40 MG capsule Take 1 capsule (40 mg total) by mouth every morning. Qty: 30 capsule, Refills: 2    sucralfate (CARAFATE) 1 g tablet Take 1 g by mouth 4 (four) times daily.         DISCHARGE INSTRUCTIONS:    Patient is to follow-up with primary care physician, pulmonologist, primary psychiatrist, neurologist as outpatient  If you experience worsening of your admission symptoms, develop shortness of breath, life threatening emergency, suicidal or homicidal thoughts you must seek medical attention immediately by calling 911 or calling your MD immediately  if symptoms less severe.  You Must read complete instructions/literature along with all the possible adverse reactions/side effects for all the Medicines you take and that have been prescribed to you. Take any new Medicines after you have completely understood and accept all the possible adverse reactions/side effects.   Please note  You were cared for by a hospitalist during your hospital stay. If you have any questions about your discharge medications or the care you received while you were in the hospital after you are discharged, you can call the unit and  asked to speak with the hospitalist on call if the hospitalist that took care of you is not available. Once you are discharged, your primary care physician will handle any further medical issues. Please note that NO REFILLS for any discharge medications will be authorized once you are discharged, as it is imperative that you return to your primary care physician (or establish a relationship with a primary care physician if you do not have one) for your aftercare needs so that they can reassess your need for medications and monitor your lab values.    Today   CHIEF COMPLAINT:   Chief Complaint  Patient presents with  . Seizures    HISTORY OF PRESENT ILLNESS:  Howard [name]  is a 36 y.o. male with a known history of alcoholic liver cirrhosis with ascites, ongoing alcohol abuse, thrombocytopenia, COPD, depression, diabetes, esophageal varices, hyperlipidemia, hypertension, schizophrenia, who presents to the hospital with complaints of seizure. Upper lip. Patient was watching TV, when he had a seizure, which was witnessed by his mother. He reported compliance with Keppra, however, he has been noncompliant taking his oral lactulose. Labs in  the emergency room revealed markedly elevated ammonia level to 154. Patient was admitted to the hospital. Keppra was advanced to 1 g twice daily dose. The patient was initiated on Xifaxan and his condition improved. However, intermittently while in the hospital patient was noted to have significant somnolence, at that point. ABGs were performed revealing significant CO2 retention , PCO2 of 72, normal pH of 7.36. It was felt that patient needs to be followed by a pulmonologist,  to have sleep study as outpatient, to qualify for CPAP/BiPAP as outpatient, if needed. Discussion by problem: #1. Seizures, now on advanced Keppra dose at 1 g twice daily, patient was advised to follow-up with primary neurologist as outpatient #2. Hepatic encephalopathy, now on Xifaxan and  lactulose, patient was advised to continue lactulose and have at least 3 or 4 bowel movements a day #3, hypercapnia, patient is to follow up with pulmonologist as outpatient, have sleep study done as outpatient, also qualify for CPAP or BiPAP as outpatient #4. Alcoholic liver disease, patient has very poor insight and continues to drink alcohol, unfortunately, patient is at risk of deterioration . If unable to stop drinking #5 schizophrenia, patient is to continue his outpatient medications, and follow-up with primary psychiatrist for further recommendations.     VITAL SIGNS:  Blood pressure (!) 141/63, pulse 74, temperature 98.2 F (36.8 C), temperature source Oral, resp. rate 17, height 6\' 3"  (1.905 m), weight (!) 162.8 kg (359 lb), SpO2 97 %.  I/O:    Intake/Output Summary (Last 24 hours) at 07/09/16 1318 Last data filed at 07/09/16 0954  Gross per 24 hour  Intake              840 ml  Output             1100 ml  Net             -260 ml    PHYSICAL EXAMINATION:  GENERAL:  35 y.o.-year-old patient lying in the bed with no acute distress.  EYES: Pupils equal, round, reactive to light and accommodation. No scleral icterus. Extraocular muscles intact.  HEENT: Head atraumatic, normocephalic. Oropharynx and nasopharynx clear.  NECK:  Supple, no jugular venous distention. No thyroid enlargement, no tenderness.  LUNGS: Normal breath sounds bilaterally, no wheezing, rales,rhonchi or crepitation. No use of accessory muscles of respiration.  CARDIOVASCULAR: S1, S2 normal. No murmurs, rubs, or gallops.  ABDOMEN: Soft, non-tender, non-distended. Bowel sounds present. No organomegaly or mass.  EXTREMITIES: No pedal edema, cyanosis, or clubbing.  NEUROLOGIC: Cranial nerves II through XII are intact. Muscle strength 5/5 in all extremities. Sensation intact. Gait not checked.  PSYCHIATRIC: The patient is alert and oriented x 3.  SKIN: No obvious rash, lesion, or ulcer.   DATA REVIEW:    CBC  Recent Labs Lab 07/08/16 0523  WBC 4.7  HGB 8.4*  HCT 27.2*  PLT 93*    Chemistries   Recent Labs Lab 07/07/16 0436  07/09/16 0555  NA 142  < > 142  K 3.8  < > 3.9  CL 106  < > 101  CO2 32  < > 37*  GLUCOSE 139*  < > 111*  BUN 12  < > 14  CREATININE 0.82  < > 0.73  CALCIUM 8.3*  < > 9.0  AST 33  --   --   ALT 20  --   --   ALKPHOS 91  --   --   BILITOT 0.5  --   --   < > =  values in this interval not displayed.  Cardiac Enzymes No results for input(s): TROPONINI in the last 168 hours.  Microbiology Results  Results for orders placed or performed during the hospital encounter of 03/03/16  Blood culture (routine x 2)     Status: None   Collection Time: 03/03/16  8:48 AM  Result Value Ref Range Status   Specimen Description BLOOD RIGHT [facility]  Final   Special Requests   Final    BOTTLES DRAWN AEROBIC AND ANAEROBIC AER ANA   Culture NO GROWTH 8 DAYS  Final   Report Status 03/11/2016 FINAL  Final  Blood culture (routine x 2)     Status: None   Collection Time: 03/03/16  8:48 AM  Result Value Ref Range Status   Specimen Description BLOOD LEFT HAND  Final   Special Requests BOTTLES DRAWN AEROBIC AND ANAEROBIC 11 ML  Final   Culture NO GROWTH 8 DAYS  Final   Report Status 03/11/2016 FINAL  Final    RADIOLOGY:  No results found.  EKG:   Orders placed or performed during the hospital encounter of 07/06/16  . EKG 12-Lead  . EKG 12-Lead      Management plans discussed with the patient, family and they are in agreement.  CODE STATUS:     Code Status Orders        Start     Ordered   07/07/16 0140  Full code  Continuous     07/07/16 0140    Code Status History    Date Active Date Inactive Code Status Order ID Comments User Context   06/10/2016  6:21 AM 06/11/2016  8:25 PM Full Code [id number]  Ihor Austin, MD Inpatient   05/12/2016  3:55 AM 05/15/2016  2:57 PM Full Code [id number]  Ihor Austin, MD Inpatient   04/28/2016  2:54 AM 04/29/2016   6:23 PM Full Code [id number]  Alexis Hugelmeyer, DO Inpatient   03/14/2016 12:30 PM 03/16/2016  3:41 PM Full Code [id number]  Shaune Pollack, MD Inpatient   03/03/2016 12:13 PM 03/06/2016  2:26 PM Full Code [id number]  Milagros Loll, MD ED   02/10/2016  5:54 PM 02/12/2016  7:34 PM Full Code [id number]  Ramonita Lab, MD Inpatient   11/11/2015  5:16 AM 11/13/2015  6:00 PM Full Code [id number]  Ihor Austin, MD Inpatient   11/07/2015 10:02 PM 11/09/2015  4:56 PM Full Code [id number]  Enid Baas, MD Inpatient   10/28/2015  8:14 PM 10/29/2015  2:26 PM Full Code [id number]  Altamese Dilling, MD Inpatient   10/16/2015 11:47 PM 10/17/2015  6:00 PM Full Code [id number]  Oralia Manis, MD Inpatient   07/30/2015  5:35 PM 08/04/2015  8:34 PM Full Code [id number]  Enedina Finner, MD Inpatient   07/05/2015  1:16 AM 07/08/2015  7:22 PM Full Code [id number]  Oralia Manis, MD Inpatient   05/30/2015  7:28 PM 06/01/2015  3:36 PM Full Code [id number]  Audery Amel, MD Inpatient      TOTAL TIME TAKING CARE OF THIS PATIENT: 40 minutes.    Katharina Caper M.D on 07/09/2016 at 1:18 PM  Between 7am to 6pm - Pager - [phone]  After 6pm go to [email or web address] - password [email or web address] [facility]  [facility]  Office  [phone]  CC: Primary care physician; Marisue Ivan, MD

## 2016-07-09 NOTE — Care Management (Signed)
Spoke with patient's pharmacy to confirm pricing of Xifaxan.  Per pharmacist patient's Xifaxan prescription will not be covered by his insurance, due to it being non formulary.  Pharmacist states that medication would be approximately $2,000.  Pharmacist states that MD would need to complete a prior authorization for the medication in order to try to obtain approval.  I notified Dr. Winona LegatoVaickute of this information.  Per MD patient's PCP will need to complete prior auth.  Patient's mother is at bedside.  When I inquired as to why patient does not take lactulose as prescribed, mother states "he has it at home, but sometimes he just gets busy and doesn't take it like he is supposed to"

## 2016-07-09 NOTE — Care Management (Signed)
Patient to discharge home today.  Resumption of home health orders placed by MD.  Barbara CowerJason with [facility] notified of discharge plan.  Patient is on chronic O2 at home.  Patient ABG today showed a CO2 of 72.  Patient does not qualify for BIPAP at discharge, as Dr. Morley KosVaicukute states that she is unable to rule out sleep apnea.  MD states that she will refer patient to pulmonology outpatient for a sleep study.

## 2016-07-09 NOTE — Progress Notes (Signed)
Discharge instructions reviewed with patient.  Understanding was verbalized and all questions were answered.  Prescriptions and new medications reviewed.  Patient discharged home via wheelchair in stable condition escorted by nursing staff.

## 2016-07-10 LAB — HEMOGLOBIN A1C
Hgb A1c MFr Bld: 6.8 % — ABNORMAL HIGH (ref 4.8–5.6)
MEAN PLASMA GLUCOSE: 148 mg/dL

## 2016-07-17 ENCOUNTER — Inpatient Hospital Stay: Payer: Self-pay | Admitting: Internal Medicine

## 2016-07-17 ENCOUNTER — Encounter: Payer: Self-pay | Admitting: *Deleted

## 2016-07-21 ENCOUNTER — Encounter: Payer: Self-pay | Admitting: Emergency Medicine

## 2016-07-21 ENCOUNTER — Emergency Department
Admission: EM | Admit: 2016-07-21 | Discharge: 2016-07-21 | Disposition: A | Discharge status: Home / Self Care | Payer: Medicare Other | Attending: Emergency Medicine | Admitting: Emergency Medicine

## 2016-07-21 DIAGNOSIS — I10 Essential (primary) hypertension: Secondary | ICD-10-CM | POA: Diagnosis not present

## 2016-07-21 DIAGNOSIS — Z5321 Procedure and treatment not carried out due to patient leaving prior to being seen by health care provider: Secondary | ICD-10-CM | POA: Insufficient documentation

## 2016-07-21 DIAGNOSIS — Z79899 Other long term (current) drug therapy: Secondary | ICD-10-CM | POA: Insufficient documentation

## 2016-07-21 DIAGNOSIS — E119 Type 2 diabetes mellitus without complications: Secondary | ICD-10-CM | POA: Diagnosis not present

## 2016-07-21 DIAGNOSIS — Z9189 Other specified personal risk factors, not elsewhere classified: Secondary | ICD-10-CM

## 2016-07-21 DIAGNOSIS — R110 Nausea: Secondary | ICD-10-CM | POA: Diagnosis present

## 2016-07-21 DIAGNOSIS — R569 Unspecified convulsions: Secondary | ICD-10-CM | POA: Insufficient documentation

## 2016-07-21 DIAGNOSIS — J449 Chronic obstructive pulmonary disease, unspecified: Secondary | ICD-10-CM | POA: Diagnosis not present

## 2016-07-21 LAB — COMPREHENSIVE METABOLIC PANEL
ALT: 27 U/L (ref 17–63)
ANION GAP: 5 (ref 5–15)
AST: 39 U/L (ref 15–41)
Albumin: 3.6 g/dL (ref 3.5–5.0)
Alkaline Phosphatase: 119 U/L (ref 38–126)
BUN: 11 mg/dL (ref 6–20)
CALCIUM: 8.9 mg/dL (ref 8.9–10.3)
CHLORIDE: 98 mmol/L — AB (ref 101–111)
CO2: 32 mmol/L (ref 22–32)
Creatinine, Ser: 0.75 mg/dL (ref 0.61–1.24)
GFR calc non Af Amer: >60 mL/min (ref >60)
Glucose, Bld: 92 mg/dL (ref 65–99)
POTASSIUM: 4.4 mmol/L (ref 3.5–5.1)
SODIUM: 135 mmol/L (ref 135–145)
Total Bilirubin: 0.5 mg/dL (ref 0.3–1.2)
Total Protein: 8.1 g/dL (ref 6.5–8.1)

## 2016-07-21 LAB — LIPASE, BLOOD: LIPASE: 31 U/L (ref 11–51)

## 2016-07-21 LAB — CBC
HEMATOCRIT: 29.4 % — AB (ref 40.0–52.0)
HEMOGLOBIN: 9 g/dL — AB (ref 13.0–18.0)
MCH: 23.4 pg — AB (ref 26.0–34.0)
MCHC: 30.5 g/dL — ABNORMAL LOW (ref 32.0–36.0)
MCV: 76.8 fL — AB (ref 80.0–100.0)
Platelets: 124 10*3/uL — ABNORMAL LOW (ref 150–440)
RBC: 3.83 MIL/uL — AB (ref 4.40–5.90)
RDW: 18 % — ABNORMAL HIGH (ref 11.5–14.5)
WBC: 5.9 10*3/uL (ref 3.8–10.6)

## 2016-07-21 NOTE — ED Triage Notes (Signed)
States he had a seizure yesterday.  Today c/o nausea..  Denies vomiting.

## 2016-07-24 NOTE — ED Provider Notes (Signed)
-----------------------------------------   7:04 AM on 07/24/2016 ----------------------------------------- Per nurses notes pt eloped pt being seen by me.   Jeanmarie PlantJames A McShane, MD 07/24/16 [phone]

## 2016-07-28 NOTE — Progress Notes (Signed)
[facility]      Assessment and Plan:  Excessive daytime sleepiness.  -Symptoms and signs of sleep apnea, including loud snoring, excessive daytime sleepiness, witnessed apneas. -Will send for sleep study.  Dyspnea with chronic respiratory failure.  -Multifactorial, likely due to combination of restrictive lung disease, possible pulmonary hypertension, as well as possible hypoxia secondary to cirrhosis. -We'll check a full pulmonary function test with shunt study, repeat echocardiogram with reporting of ventricular systolic pressures to rule out pulmonary hypertension  Cirrhosis.  -Discussed the importance of cessation, as continued drinking well kasa cirrhosis to worsen and will likely continue.  Obesity. -This is likely contributing to the patient's dyspnea, possible sleep apnea, respiratory failure, and debility. -Weight loss recommended.   Date: 07/28/2016  MRN# [id number] Howard [name] 1981/06/05  Referring Physician:   Andee LinemanJoey A [name] is a 35 y.o. old male seen in consultation for chief complaint of:    Chief Complaint  Patient presents with  . Hospitalization Follow-up    pt discharged 07-09-16. pt states breathing is baseline since being discharge. pt c/o sob with exertion, wheezing, fatigued & weakness. pt on 2L.    HPI:   The patient is a 35 year old Caucasian male with a history significant for history of alcoholic liver cirrhosis with ascites, ongoing alcohol abuse, thrombocytopenia, COPD, depression, diabetes, esophageal varices, hyperlipidemia, hypertension, schizophrenia. He was recently discharged from the hospital on 07/09/16 from hepatic cirrhosis. At that time it was noted that he had hypercapnic respiratory failure with possible sleep apnea, and thus asked to follow-up with pulmonary. Since his discharge she was back in the ER on 07/21/16 with a reported seizure, though the patient eloped before he could be seen by a  physician.  He was discharged with oxygen, he was not wearing it today and his oxygen was about 80% when he walked in today. He is not very active, he is not a smoker. He gets winded with mild activities, such as getting out of bed or walking around his house.  He is very sedentary and does little activity. He has gained a lot of weight, about 100 lbs in past 5 years.   He drinks every day 2- 40 oz beers per day. He used to drink vodka but his mother made him stop that. He has schizophrenia and is on zyprexia. He is on keppra for seizures.   Mother notes that he snores at night very loudly, and sometimes stops breathing in his sleep.   07/31/16; ambulated 200 feet with desat to 87% on 2L; minimal dyspnea, wandering gait.   Echocardiogram 07/09/16; EF=65%, mild RV dilation, no pressures reported.   PMHX:   Past Medical History:  Diagnosis Date  . Alcoholic cirrhosis of liver with ascites (HCC)   . Anemia   . COPD (chronic obstructive pulmonary disease) (HCC)   . Depression   . Diabetes (HCC)   . Diabetes mellitus, type II (HCC)   . Esophageal varices (HCC)   . Heart disease   . Hyperlipemia   . Hypertension   . Liver disease   . Multiple thyroid nodules   . Portal hypertensive gastropathy   . Schizophrenia [facility](HCC)    Surgical Hx:  Past Surgical History:  Procedure Laterality Date  . ESOPHAGOGASTRODUODENOSCOPY N/A 10/05/2015   Procedure: ESOPHAGOGASTRODUODENOSCOPY (EGD);  Surgeon: Elnita MaxwellMatthew Gordon Rein, MD;  [facility];  Service: Endoscopy;  Laterality: N/A;  . ESOPHAGOGASTRODUODENOSCOPY (EGD) WITH PROPOFOL N/A 08/03/2015   Procedure: ESOPHAGOGASTRODUODENOSCOPY (EGD) WITH PROPOFOL;  Surgeon:  Elnita MaxwellMatthew Gordon Rein, MD;  [facility];  Service: Endoscopy;  Laterality: N/A;  . ESOPHAGOGASTRODUODENOSCOPY (EGD) WITH PROPOFOL N/A 08/31/2015   Procedure: ESOPHAGOGASTRODUODENOSCOPY (EGD) WITH PROPOFOL;  Surgeon: Elnita MaxwellMatthew Gordon Rein, MD;  [facility];   Service: Endoscopy;  Laterality: N/A;  . ESOPHAGOGASTRODUODENOSCOPY (EGD) WITH PROPOFOL N/A 04/04/2016   Procedure: ESOPHAGOGASTRODUODENOSCOPY (EGD) WITH PROPOFOL;  Surgeon: Scot Junobert T Elliott, MD;  [facility];  Service: Endoscopy;  Laterality: N/A;  . NO PAST SURGERIES     Family Hx:  Family History  Problem Relation Age of Onset  . Heart disease Mother   . Hypertension Mother   . Hyperlipidemia Mother   . Stroke Father   . Heart attack Father   . Hypertension Father   . Heart disease Father   . Alcohol abuse Father   . Heart disease Brother    Social Hx:   Social History  Substance Use Topics  . Smoking status: Never Smoker  . Smokeless tobacco: Never Used  . Alcohol use 48.0 oz/week    80 Cans of beer per week     Comment: last drink last night 10/29   Medication:   Reviewed.    Allergies:  Patient has no known allergies.  Review of Systems: Gen:  Denies  fever, sweats, chills HEENT: Denies blurred vision, double vision. bleeds, sore throat Cvc:  No dizziness, chest pain. Resp:   Denies cough or sputum production. Gi: Denies swallowing difficulty, stomach pain. Gu:  Denies bladder incontinence, burning urine Ext:   No Joint pain, stiffness. Skin: No skin rash,  hives  Endoc:  No polyuria, polydipsia. Psych: No depression, insomnia. Other:  All other systems were reviewed with the patient and were negative other that what is mentioned in the HPI.   Physical Examination:   VS: BP 132/78 (BP Location: Left Arm, Cuff Size: Normal)   Pulse 86   Ht 6\' 3"  (1.905 m)   Wt (!) 358 lb (162.4 kg)   SpO2 91%   BMI 44.75 kg/m   General Appearance: No distress , oriented obesity. Neuro:without focal findings,  speech normal,  HEENT: PERRLA, EOM intact.   Pulmonary: normal breath sounds, No wheezing. Decreased air entry bilaterally. CardiovascularNormal S1,S2.  No m/r/g.   Abdomen: Benign, Soft, non-tender. Distended, obese. Renal:  No costovertebral  tenderness  GU:  No performed at this time. Endoc: No evident thyromegaly, no signs of acromegaly. Skin:   warm, no rashes, no ecchymosis  Extremities: normal, no cyanosis, clubbing.  Other findings:    LABORATORY PANEL:   CBC No results for input(s): WBC, HGB, HCT, PLT in the last 168 hours. ------------------------------------------------------------------------------------------------------------------  Chemistries  No results for input(s): NA, K, CL, CO2, GLUCOSE, BUN, CREATININE, CALCIUM, MG, AST, ALT, ALKPHOS, BILITOT in the last 168 hours.  Invalid input(s): GFRCGP ------------------------------------------------------------------------------------------------------------------  Cardiac Enzymes No results for input(s): TROPONINI in the last 168 hours. ------------------------------------------------------------  RADIOLOGY:  No results found.     Thank  you for the consultation and for allowing [facility] to assist in the care of your patient. Our recommendations are noted above.  Please contact us if we can be of further service.   Wells Guileseep Lelynd Poer, MD.  Board Certified in Internal Medicine, Pulmonary Medicine, Critical Care Medicine, and Sleep Medicine.  [facility] Office Number: [phone]  Santiago Gladavid Kasa, M.D.  Stephanie AcreVishal Mungal, M.D.  Billy Fischeravid Simonds, M.D  07/28/2016

## 2016-07-31 ENCOUNTER — Ambulatory Visit (INDEPENDENT_AMBULATORY_CARE_PROVIDER_SITE_OTHER): Payer: Medicare Other | Admitting: Internal Medicine

## 2016-07-31 ENCOUNTER — Encounter: Payer: Self-pay | Admitting: Internal Medicine

## 2016-07-31 ENCOUNTER — Other Ambulatory Visit: Payer: Self-pay | Admitting: Internal Medicine

## 2016-07-31 VITALS — BP 132/78 | HR 86 | Ht 75.0 in | Wt 358.0 lb

## 2016-07-31 DIAGNOSIS — I272 Pulmonary hypertension, unspecified: Secondary | ICD-10-CM

## 2016-07-31 DIAGNOSIS — R0609 Other forms of dyspnea: Secondary | ICD-10-CM | POA: Diagnosis not present

## 2016-07-31 DIAGNOSIS — G4719 Other hypersomnia: Secondary | ICD-10-CM | POA: Diagnosis not present

## 2016-07-31 NOTE — Patient Instructions (Addendum)
--  Echocardiogram with report of right ventricular pressures to rule out Pulmonary hypertension.   --Sleep study.   --Full pulmonary function test with "shunt study"    Sleep Apnea Sleep apnea is disorder that affects a person's sleep. A person with sleep apnea has abnormal pauses in their breathing when they sleep. It is hard for them to get a good sleep. This makes a person tired during the day. It also can lead to other physical problems. There are three types of sleep apnea. One type is when breathing stops for a short time because your airway is blocked (obstructive sleep apnea). Another type is when the brain sometimes fails to give the normal signal to breathe to the muscles that control your breathing (central sleep apnea). The third type is a combination of the other two types. HOME CARE   Take all medicine as told by your doctor.  Avoid alcohol, calming medicines (sedatives), and depressant drugs.  Try to lose weight if you are overweight. Talk to your doctor about a healthy weight goal.  Your doctor may have you use a device that helps to open your airway. It can help you get the air that you need. It is called a positive airway pressure (PAP) device.   MAKE SURE YOU:   Understand these instructions.  Will watch your condition.  Will get help right away if you are not doing well or get worse.  It may take approximately 1 month for you to get used to wearing her CPAP every night.  Be sure to work with your machine to get used to it, be patient, it may take time!

## 2016-08-04 ENCOUNTER — Ambulatory Visit (INDEPENDENT_AMBULATORY_CARE_PROVIDER_SITE_OTHER): Payer: Medicare Other

## 2016-08-04 ENCOUNTER — Other Ambulatory Visit: Payer: Self-pay | Admitting: Internal Medicine

## 2016-08-04 ENCOUNTER — Other Ambulatory Visit: Payer: Self-pay

## 2016-08-04 DIAGNOSIS — I272 Pulmonary hypertension, unspecified: Secondary | ICD-10-CM | POA: Diagnosis not present

## 2016-08-04 LAB — ECHOCARDIOGRAM LIMITED
CHL CUP PV REG GRAD DIAS: 25 mmHg
CHL CUP REG VEL DIAS: 249 cm/s
FS: 44 % (ref 28–44)
IVS/LV PW RATIO, ED: 1.46
IVS: 11.2 cm — AB (ref 0.6–1.1)
LV PW d: 14 mm — AB (ref 0.6–1.1)
LVIDD: 52.9 cm — AB (ref 3.5–6.0)
Reg peak vel: 257 cm/s
TAPSE: 30.7 mm
TRMAXVEL: 257 cm/s

## 2016-08-07 ENCOUNTER — Encounter: Payer: Self-pay | Admitting: Emergency Medicine

## 2016-08-07 ENCOUNTER — Inpatient Hospital Stay
Admission: EM | Admit: 2016-08-07 | Discharge: 2016-08-12 | DRG: 442 | Disposition: A | Discharge status: Home Health | Payer: Medicare Other | Attending: Internal Medicine | Admitting: Internal Medicine

## 2016-08-07 DIAGNOSIS — I10 Essential (primary) hypertension: Secondary | ICD-10-CM | POA: Diagnosis present

## 2016-08-07 DIAGNOSIS — K921 Melena: Secondary | ICD-10-CM | POA: Diagnosis present

## 2016-08-07 DIAGNOSIS — Z8249 Family history of ischemic heart disease and other diseases of the circulatory system: Secondary | ICD-10-CM | POA: Diagnosis not present

## 2016-08-07 DIAGNOSIS — E785 Hyperlipidemia, unspecified: Secondary | ICD-10-CM | POA: Diagnosis present

## 2016-08-07 DIAGNOSIS — K766 Portal hypertension: Secondary | ICD-10-CM | POA: Diagnosis present

## 2016-08-07 DIAGNOSIS — T473X6A Underdosing of saline and osmotic laxatives, initial encounter: Secondary | ICD-10-CM | POA: Diagnosis present

## 2016-08-07 DIAGNOSIS — Z9981 Dependence on supplemental oxygen: Secondary | ICD-10-CM

## 2016-08-07 DIAGNOSIS — E669 Obesity, unspecified: Secondary | ICD-10-CM | POA: Diagnosis present

## 2016-08-07 DIAGNOSIS — I959 Hypotension, unspecified: Secondary | ICD-10-CM | POA: Diagnosis not present

## 2016-08-07 DIAGNOSIS — F1010 Alcohol abuse, uncomplicated: Secondary | ICD-10-CM | POA: Diagnosis present

## 2016-08-07 DIAGNOSIS — F329 Major depressive disorder, single episode, unspecified: Secondary | ICD-10-CM | POA: Diagnosis present

## 2016-08-07 DIAGNOSIS — Z7984 Long term (current) use of oral hypoglycemic drugs: Secondary | ICD-10-CM | POA: Diagnosis not present

## 2016-08-07 DIAGNOSIS — Z823 Family history of stroke: Secondary | ICD-10-CM

## 2016-08-07 DIAGNOSIS — R4182 Altered mental status, unspecified: Secondary | ICD-10-CM

## 2016-08-07 DIAGNOSIS — Z7189 Other specified counseling: Secondary | ICD-10-CM

## 2016-08-07 DIAGNOSIS — M6281 Muscle weakness (generalized): Secondary | ICD-10-CM | POA: Diagnosis present

## 2016-08-07 DIAGNOSIS — E119 Type 2 diabetes mellitus without complications: Secondary | ICD-10-CM | POA: Diagnosis present

## 2016-08-07 DIAGNOSIS — Z6841 Body Mass Index (BMI) 40.0 and over, adult: Secondary | ICD-10-CM

## 2016-08-07 DIAGNOSIS — T366X6A Underdosing of rifampicins, initial encounter: Secondary | ICD-10-CM | POA: Diagnosis present

## 2016-08-07 DIAGNOSIS — K3189 Other diseases of stomach and duodenum: Secondary | ICD-10-CM | POA: Diagnosis present

## 2016-08-07 DIAGNOSIS — F209 Schizophrenia, unspecified: Secondary | ICD-10-CM | POA: Diagnosis present

## 2016-08-07 DIAGNOSIS — R2681 Unsteadiness on feet: Secondary | ICD-10-CM

## 2016-08-07 DIAGNOSIS — K922 Gastrointestinal hemorrhage, unspecified: Secondary | ICD-10-CM | POA: Diagnosis present

## 2016-08-07 DIAGNOSIS — J9612 Chronic respiratory failure with hypercapnia: Secondary | ICD-10-CM | POA: Diagnosis present

## 2016-08-07 DIAGNOSIS — K7290 Hepatic failure, unspecified without coma: Principal | ICD-10-CM | POA: Diagnosis present

## 2016-08-07 DIAGNOSIS — J449 Chronic obstructive pulmonary disease, unspecified: Secondary | ICD-10-CM | POA: Diagnosis present

## 2016-08-07 DIAGNOSIS — K7031 Alcoholic cirrhosis of liver with ascites: Secondary | ICD-10-CM | POA: Diagnosis present

## 2016-08-07 DIAGNOSIS — R569 Unspecified convulsions: Secondary | ICD-10-CM | POA: Diagnosis present

## 2016-08-07 DIAGNOSIS — F1020 Alcohol dependence, uncomplicated: Secondary | ICD-10-CM | POA: Diagnosis present

## 2016-08-07 DIAGNOSIS — Y92009 Unspecified place in unspecified non-institutional (private) residence as the place of occurrence of the external cause: Secondary | ICD-10-CM | POA: Diagnosis not present

## 2016-08-07 DIAGNOSIS — E7220 Disorder of urea cycle metabolism, unspecified: Secondary | ICD-10-CM

## 2016-08-07 DIAGNOSIS — Z811 Family history of alcohol abuse and dependence: Secondary | ICD-10-CM

## 2016-08-07 DIAGNOSIS — Z515 Encounter for palliative care: Secondary | ICD-10-CM | POA: Diagnosis not present

## 2016-08-07 LAB — HEMOGLOBIN: Hemoglobin: 8.8 g/dL — ABNORMAL LOW (ref 13.0–18.0)

## 2016-08-07 LAB — COMPREHENSIVE METABOLIC PANEL
ALT: 27 U/L (ref 17–63)
ANION GAP: 7 (ref 5–15)
AST: 36 U/L (ref 15–41)
Albumin: 3.4 g/dL — ABNORMAL LOW (ref 3.5–5.0)
Alkaline Phosphatase: 118 U/L (ref 38–126)
BUN: 18 mg/dL (ref 6–20)
CALCIUM: 8.5 mg/dL — AB (ref 8.9–10.3)
CHLORIDE: 101 mmol/L (ref 101–111)
CO2: 30 mmol/L (ref 22–32)
CREATININE: 1.01 mg/dL (ref 0.61–1.24)
Glucose, Bld: 148 mg/dL — ABNORMAL HIGH (ref 65–99)
Potassium: 4.8 mmol/L (ref 3.5–5.1)
SODIUM: 138 mmol/L (ref 135–145)
Total Bilirubin: 0.3 mg/dL (ref 0.3–1.2)
Total Protein: 7.6 g/dL (ref 6.5–8.1)

## 2016-08-07 LAB — CBC
HCT: 28.6 % — ABNORMAL LOW (ref 40.0–52.0)
HEMOGLOBIN: 8.7 g/dL — AB (ref 13.0–18.0)
MCH: 23.6 pg — AB (ref 26.0–34.0)
MCHC: 30.4 g/dL — AB (ref 32.0–36.0)
MCV: 77.5 fL — ABNORMAL LOW (ref 80.0–100.0)
PLATELETS: 127 10*3/uL — AB (ref 150–440)
RBC: 3.69 MIL/uL — AB (ref 4.40–5.90)
RDW: 19.2 % — ABNORMAL HIGH (ref 11.5–14.5)
WBC: 7.7 10*3/uL (ref 3.8–10.6)

## 2016-08-07 LAB — ACETAMINOPHEN LEVEL: Acetaminophen (Tylenol), Serum: <10 ug/mL — ABNORMAL LOW (ref 10–30)

## 2016-08-07 LAB — GLUCOSE, CAPILLARY: Glucose-Capillary: 96 mg/dL (ref 65–99)

## 2016-08-07 LAB — TYPE AND SCREEN
ABO/RH(D): O POS
ANTIBODY SCREEN: NEGATIVE

## 2016-08-07 LAB — PROTIME-INR
INR: 1.15
PROTHROMBIN TIME: 14.8 s (ref 11.4–15.2)

## 2016-08-07 LAB — AMMONIA: AMMONIA: 115 umol/L — AB (ref 9–35)

## 2016-08-07 MED ORDER — SODIUM CHLORIDE 0.9 % IV SOLN: 250.0000 mL | INTRAVENOUS | Status: DC | PRN

## 2016-08-07 MED ORDER — ADULT MULTIVITAMIN W/MINERALS CH
1.0000 | ORAL_TABLET | Freq: Every day | ORAL | Status: DC
  Administered 2016-08-08 – 2016-08-12 (×5): 1 via ORAL
  Filled 2016-08-07 (×5): qty 1

## 2016-08-07 MED ORDER — NADOLOL 20 MG PO TABS
20.0000 mg | ORAL_TABLET | Freq: Every day | ORAL | Status: DC
  Administered 2016-08-08: 20 mg via ORAL
  Filled 2016-08-07: qty 1

## 2016-08-07 MED ORDER — SODIUM CHLORIDE 0.9 % IV SOLN
8.0000 mg/h | INTRAVENOUS | Status: DC
  Administered 2016-08-07 – 2016-08-09 (×4): 8 mg/h via INTRAVENOUS
  Filled 2016-08-07 (×5): qty 80

## 2016-08-07 MED ORDER — LACTULOSE 10 GM/15ML PO SOLN
30.0000 g | ORAL | Status: DC
  Administered 2016-08-07 – 2016-08-08 (×2): 30 g via ORAL
  Administered 2016-08-08: 40 g via ORAL
  Administered 2016-08-08: 30 g via ORAL
  Filled 2016-08-07 (×4): qty 60

## 2016-08-07 MED ORDER — SODIUM CHLORIDE 0.9% FLUSH
3.0000 mL | Freq: Two times a day (BID) | INTRAVENOUS | Status: DC
  Administered 2016-08-07 – 2016-08-12 (×9): 3 mL via INTRAVENOUS

## 2016-08-07 MED ORDER — INSULIN ASPART 100 UNIT/ML ~~LOC~~ SOLN
0.0000 [IU] | Freq: Three times a day (TID) | SUBCUTANEOUS | Status: DC
  Administered 2016-08-08 – 2016-08-09 (×5): 1 [IU] via SUBCUTANEOUS
  Administered 2016-08-10: 2 [IU] via SUBCUTANEOUS
  Administered 2016-08-11 (×2): 1 [IU] via SUBCUTANEOUS
  Administered 2016-08-12: 2 [IU] via SUBCUTANEOUS
  Filled 2016-08-07 (×4): qty 1
  Filled 2016-08-07: qty 2
  Filled 2016-08-07 (×2): qty 1
  Filled 2016-08-07: qty 2
  Filled 2016-08-07: qty 1
  Filled 2016-08-07: qty 2

## 2016-08-07 MED ORDER — SODIUM CHLORIDE 0.9 % IV SOLN
50.0000 ug/h | INTRAVENOUS | Status: DC
  Administered 2016-08-07 – 2016-08-09 (×4): 50 ug/h via INTRAVENOUS
  Filled 2016-08-07 (×9): qty 1

## 2016-08-07 MED ORDER — FUROSEMIDE 20 MG PO TABS
20.0000 mg | ORAL_TABLET | Freq: Every day | ORAL | Status: DC
  Administered 2016-08-08 – 2016-08-12 (×5): 20 mg via ORAL
  Filled 2016-08-07 (×5): qty 1

## 2016-08-07 MED ORDER — SODIUM CHLORIDE 0.9% FLUSH: 3.0000 mL | INTRAVENOUS | Status: DC | PRN

## 2016-08-07 MED ORDER — SUCRALFATE 1 G PO TABS
1.0000 g | ORAL_TABLET | Freq: Four times a day (QID) | ORAL | Status: DC
  Administered 2016-08-07 – 2016-08-12 (×18): 1 g via ORAL
  Filled 2016-08-07 (×18): qty 1

## 2016-08-07 MED ORDER — PANTOPRAZOLE SODIUM 40 MG IV SOLR
80.0000 mg | Freq: Once | INTRAVENOUS | Status: AC
  Administered 2016-08-07: 80 mg via INTRAVENOUS
  Filled 2016-08-07: qty 80

## 2016-08-07 MED ORDER — LEVETIRACETAM 500 MG PO TABS
1000.0000 mg | ORAL_TABLET | Freq: Two times a day (BID) | ORAL | Status: DC
  Administered 2016-08-07 – 2016-08-12 (×10): 1000 mg via ORAL
  Filled 2016-08-07 (×11): qty 2

## 2016-08-07 MED ORDER — LOSARTAN POTASSIUM 50 MG PO TABS
25.0000 mg | ORAL_TABLET | Freq: Every day | ORAL | Status: DC
  Administered 2016-08-08: 25 mg via ORAL
  Filled 2016-08-07: qty 1

## 2016-08-07 MED ORDER — RIFAXIMIN 550 MG PO TABS
550.0000 mg | ORAL_TABLET | Freq: Two times a day (BID) | ORAL | Status: DC
  Administered 2016-08-07 – 2016-08-12 (×10): 550 mg via ORAL
  Filled 2016-08-07 (×10): qty 1

## 2016-08-07 MED ORDER — ACAMPROSATE CALCIUM 333 MG PO TBEC
666.0000 mg | DELAYED_RELEASE_TABLET | Freq: Three times a day (TID) | ORAL | Status: DC
  Administered 2016-08-08 – 2016-08-12 (×14): 666 mg via ORAL
  Filled 2016-08-07 (×14): qty 2

## 2016-08-07 MED ORDER — CITALOPRAM HYDROBROMIDE 20 MG PO TABS
20.0000 mg | ORAL_TABLET | ORAL | Status: DC
  Administered 2016-08-08 – 2016-08-12 (×5): 20 mg via ORAL
  Filled 2016-08-07 (×5): qty 1

## 2016-08-07 MED ORDER — OLANZAPINE 10 MG PO TABS
30.0000 mg | ORAL_TABLET | Freq: Every day | ORAL | Status: DC
  Administered 2016-08-07 – 2016-08-11 (×5): 30 mg via ORAL
  Filled 2016-08-07 (×5): qty 3

## 2016-08-07 MED ORDER — SPIRONOLACTONE 25 MG PO TABS
50.0000 mg | ORAL_TABLET | Freq: Two times a day (BID) | ORAL | Status: DC
  Administered 2016-08-07 – 2016-08-08 (×2): 50 mg via ORAL
  Filled 2016-08-07 (×2): qty 2

## 2016-08-07 MED ORDER — PANTOPRAZOLE SODIUM 40 MG IV SOLR: 40.0000 mg | Freq: Two times a day (BID) | INTRAVENOUS | Status: DC

## 2016-08-07 MED ORDER — INSULIN ASPART 100 UNIT/ML ~~LOC~~ SOLN: 0.0000 [IU] | Freq: Every day | SUBCUTANEOUS | Status: DC

## 2016-08-07 NOTE — ED Provider Notes (Signed)
[facility] Emergency Department Provider Note    None    (approximate)  I have reviewed the triage vital signs and the nursing notes.   HISTORY  Chief Complaint Melena    HPI Howard [name] is a 35 y.o. male  with complex past medical history including alcoholic cirrhosis as well as esophageal varices who presents with 3 days of progressively worsening melena and epigastric discomfort. Patient is followed by GI given his complex past medical history. His primary care physician her that he was having worsening melanosis directed to the ER. Patient denies any abdominal pain at this time. States he's been taking [employer] BC powder for the past week due to intermittent headaches. Denies any headache at this time. He is otherwise been compliant with his medications. States that he feels that he is gaining weight but denies any shortness of breath at this time.   Past Medical History:  Diagnosis Date  . Alcoholic cirrhosis of liver with ascites (HCC)   . Anemia   . COPD (chronic obstructive pulmonary disease) (HCC)   . Depression   . Diabetes (HCC)   . Diabetes mellitus, type II (HCC)   . Esophageal varices (HCC)   . Heart disease   . Hyperlipemia   . Hypertension   . Liver disease   . Multiple thyroid nodules   . Portal hypertensive gastropathy   . Schizophrenia (HCC)    Family History  Problem Relation Age of Onset  . Heart disease Mother   . Hypertension Mother   . Hyperlipidemia Mother   . Stroke Father   . Heart attack Father   . Hypertension Father   . Heart disease Father   . Alcohol abuse Father   . Heart disease Brother    Past Surgical History:  Procedure Laterality Date  . ESOPHAGOGASTRODUODENOSCOPY N/A 10/05/2015   Procedure: ESOPHAGOGASTRODUODENOSCOPY (EGD);  Surgeon: Elnita Maxwell, MD;  Location: [facility] ENDOSCOPY;  Service: Endoscopy;  Laterality: N/A;  . ESOPHAGOGASTRODUODENOSCOPY (EGD) WITH PROPOFOL N/A 08/03/2015   Procedure: ESOPHAGOGASTRODUODENOSCOPY (EGD) WITH PROPOFOL;  Surgeon: Elnita Maxwell, MD;  Location: [facility] ENDOSCOPY;  Service: Endoscopy;  Laterality: N/A;  . ESOPHAGOGASTRODUODENOSCOPY (EGD) WITH PROPOFOL N/A 08/31/2015   Procedure: ESOPHAGOGASTRODUODENOSCOPY (EGD) WITH PROPOFOL;  Surgeon: Elnita Maxwell, MD;  Location: [facility] ENDOSCOPY;  Service: Endoscopy;  Laterality: N/A;  . ESOPHAGOGASTRODUODENOSCOPY (EGD) WITH PROPOFOL N/A 04/04/2016   Procedure: ESOPHAGOGASTRODUODENOSCOPY (EGD) WITH PROPOFOL;  Surgeon: Scot Jun, MD;  Location: [facility] ENDOSCOPY;  Service: Endoscopy;  Laterality: N/A;  . NO PAST SURGERIES     Patient Active Problem List   Diagnosis Date Noted  . Alcoholic cirrhosis of liver (HCC) 07/09/2016  . Acute respiratory failure with hypercapnia (HCC) 07/09/2016  . Hyperglycemia 07/09/2016  . Seizure (HCC) 06/10/2016  . Altered mental status 05/12/2016  . Headache 04/29/2016  . OSA (obstructive sleep apnea) 04/29/2016  . Anemia 04/29/2016  . Thrombocytopenia (HCC) 04/29/2016  . Coagulopathy (HCC) 04/29/2016  . Obesity 04/29/2016  . Generalized weakness 04/29/2016  . Acute hepatic encephalopathy 03/14/2016  . Controlled type 2 diabetes mellitus without complication (HCC) 01/15/2016  . Normocytic anemia 12/12/2015  . Essential (primary) hypertension 12/11/2015  . Pure hypercholesterolemia 12/11/2015  . Fever 11/11/2015  . Ascites due to alcoholic cirrhosis (HCC) 11/11/2015  . Cirrhosis of liver with ascites (HCC) 11/11/2015  . Hepatic encephalopathy (HCC) 10/16/2015  . Type 2 diabetes mellitus (HCC) 10/16/2015  . Acute renal failure (ARF) (HCC) 07/30/2015  . Alcoholic cirrhosis of liver with  ascites (HCC) 07/19/2015  . Alcoholic cirrhosis (HCC) 07/19/2015  . Hypoxia 07/04/2015  . Elevated transaminase level 07/04/2015  . DOE (dyspnea on exertion) 07/04/2015  . Alcohol abuse 05/31/2015  . Hypertension 05/29/2015  . Paranoid schizophrenia (HCC) 03/20/2015       Prior to Admission medications   Medication Sig Start Date End Date Taking? Authorizing Provider  acamprosate (CAMPRAL) 333 MG tablet Take 2 tablets (666 mg total) by mouth 3 (three) times daily with meals. 06/19/16   Audery AmelJohn T [name], MD  citalopram (CELEXA) 20 MG tablet Take 20 mg by mouth every morning. 04/08/16   Historical Provider, MD  furosemide (LASIX) 20 MG tablet Take 1 tablet (20 mg total) by mouth daily. 05/15/16   Alford Highlandichard Wieting, MD  lactulose (CHRONULAC) 10 GM/15ML solution Take 45 mLs (30 g total) by mouth every 4 (four) hours. 07/09/16   Katharina Caperima Vaickute, MD  levETIRAcetam (KEPPRA) 1000 MG tablet Take 1 tablet (1,000 mg total) by mouth 2 (two) times daily. 07/09/16   Katharina Caperima Vaickute, MD  losartan (COZAAR) 25 MG tablet Take 25 mg by mouth daily.    Historical Provider, MD  metFORMIN (GLUCOPHAGE) 500 MG tablet Take 1 tablet (500 mg total) by mouth 2 (two) times daily with a meal. 05/15/16   Alford Highlandichard Wieting, MD  Multiple Vitamin (MULTIVITAMIN WITH MINERALS) TABS tablet Take 1 tablet by mouth daily. 08/04/15   Ramonita LabAruna Gouru, MD  nadolol (CORGARD) 20 MG tablet Take 20 mg by mouth daily.    Historical Provider, MD  OLANZapine (ZYPREXA) 15 MG tablet Take 2 tablets (30 mg total) by mouth at bedtime. 05/22/16   Audery AmelJohn T [name], MD  omeprazole (PRILOSEC) 40 MG capsule Take 1 capsule (40 mg total) by mouth every morning. 02/12/16   Enid Baasadhika Kalisetti, MD  rifaximin (XIFAXAN) 550 MG TABS tablet Take 1 tablet (550 mg total) by mouth 2 (two) times daily. 07/09/16   Katharina Caperima Vaickute, MD  spironolactone (ALDACTONE) 50 MG tablet Take 1 tablet (50 mg total) by mouth 2 (two) times daily. 07/09/16   Katharina Caperima Vaickute, MD  sucralfate (CARAFATE) 1 g tablet Take 1 g by mouth 4 (four) times daily.    Historical Provider, MD    Allergies Patient has no known allergies.    Social History Social History  Substance Use Topics  . Smoking status: Never Smoker  . Smokeless tobacco: Never Used  . Alcohol use 30.0  oz/week    50 Cans of beer per week     Comment: 5 beers yesterday    Review of Systems Patient denies headaches, rhinorrhea, blurry vision, numbness, shortness of breath, chest pain, edema, cough, abdominal pain, nausea, vomiting, diarrhea, dysuria, fevers, rashes or hallucinations unless otherwise stated above in HPI. ____________________________________________   PHYSICAL EXAM:  VITAL SIGNS: Vitals:   08/07/16 1230  BP: 116/61  Pulse: 79  Resp: 20  Temp: 98.4 F (36.9 C)    Constitutional: Alert and oriented. Ill appearing in no acute distress. Eyes: Conjunctivae are normal. PERRL. EOMI. Head: Atraumatic. Nose: No congestion/rhinnorhea. Mouth/Throat: Mucous membranes are moist.  Oropharynx non-erythematous. Neck: No stridor. Painless ROM. No cervical spine tenderness to palpation Hematological/Lymphatic/Immunilogical: No cervical lymphadenopathy. Cardiovascular: Normal rate, regular rhythm. Grossly normal heart sounds.  Good peripheral circulation. Respiratory: Normal respiratory effort.  No retractions. Lungs CTAB. Gastrointestinal: Soft and nontender. No distention. No abdominal bruits. No CVA tenderness.  Melenotic stools with grossly positive guaiac test  Musculoskeletal: No lower extremity tenderness nor edema.  No joint effusions. Neurologic:  Normal speech  and language. No gross focal neurologic deficits are appreciated. No gait instability. Skin:  Skin is warm, dry and intact. No rash noted.   ____________________________________________   LABS (all labs ordered are listed, but only abnormal results are displayed)  Results for orders placed or performed during the hospital encounter of 08/07/16 (from the past 24 hour(s))  Comprehensive metabolic panel     Status: Abnormal   Collection Time: 08/07/16 12:35 PM  Result Value Ref Range   Sodium 138 135 - 145 mmol/L   Potassium 4.8 3.5 - 5.1 mmol/L   Chloride 101 101 - 111 mmol/L   CO2 30 22 - 32 mmol/L    Glucose, Bld 148 (H) 65 - 99 mg/dL   BUN 18 6 - 20 mg/dL   Creatinine, Ser 5.36 0.61 - 1.24 mg/dL   Calcium 8.5 (L) 8.9 - 10.3 mg/dL   Total Protein 7.6 6.5 - 8.1 g/dL   Albumin 3.4 (L) 3.5 - 5.0 g/dL   AST 36 15 - 41 U/L   ALT 27 17 - 63 U/L   Alkaline Phosphatase 118 38 - 126 U/L   Total Bilirubin 0.3 0.3 - 1.2 mg/dL   GFR calc non Af Amer >60 >60 mL/min   GFR calc Af Amer >60 >60 mL/min   Anion gap 7 5 - 15  Type and screen [facility]     Status: None   Collection Time: 08/07/16 12:36 PM  Result Value Ref Range   ABO/RH(D) O POS    Antibody Screen NEG    Sample Expiration 08/10/2016   Protime-INR     Status: None   Collection Time: 08/07/16  1:02 PM  Result Value Ref Range   Prothrombin Time 14.8 11.4 - 15.2 seconds   INR 1.15   CBC     Status: Abnormal   Collection Time: 08/07/16  2:40 PM  Result Value Ref Range   WBC 7.7 3.8 - 10.6 K/uL   RBC 3.69 (L) 4.40 - 5.90 MIL/uL   Hemoglobin 8.7 (L) 13.0 - 18.0 g/dL   HCT 64.4 (L) 03.4 - 74.2 %   MCV 77.5 (L) 80.0 - 100.0 fL   MCH 23.6 (L) 26.0 - 34.0 pg   MCHC 30.4 (L) 32.0 - 36.0 g/dL   RDW 59.5 (H) 63.8 - 75.6 %   Platelets 127 (L) 150 - 440 K/uL  Ammonia     Status: Abnormal   Collection Time: 08/07/16  2:40 PM  Result Value Ref Range   Ammonia 115 (H) 9 - 35 umol/L   ____________________________________________  EKG ___________________________________  RADIOLOGY   ____________________________________________   PROCEDURES  Procedure(s) performed: none Procedures    Critical Care performed: no ____________________________________________   INITIAL IMPRESSION / ASSESSMENT AND PLAN / ED COURSE  Pertinent labs & imaging results that were available during my care of the patient were reviewed by me and considered in my medical decision making (see chart for details).  DDX: Upper GI bleed, varices, erosive gastritis, diverticulosis,   Howard [name] is a 35 y.o. who presents to the  ED with 3 days of worsening melena.  Patient is AFVSS in ED. Exam as above. Given current presentation have considered the above differential.  Exam is consistent with melena guaiac-positive stool. Based on his past medical history is significant increased risk of life-threatening GI bleed with known esophageal varices. Based on his history of increased BC powder use as well as intermittent epigastric discomfort I do suspect component of gastritis  or peptic ulcer. Patient hemodynamic stable at this time.  The patient will be placed on continuous pulse oximetry and telemetry for monitoring.  Laboratory evaluation will be sent to evaluate for the above complaints.  Patient will be started on octreotide as well as Protonix.  Do not feel emergent transfusion clinically indicated at this time. Patient will require admission for further evaluation and management.  Have discussed with the patient and available family all diagnostics and treatments performed thus far and all questions were answered to the best of my ability. The patient demonstrates understanding and agreement with plan.    Clinical Course      ____________________________________________   FINAL CLINICAL IMPRESSION(S) / ED DIAGNOSES  Final diagnoses:  Acute upper GI bleed  Melena  Hyperammonemia (HCC)      NEW MEDICATIONS STARTED DURING THIS VISIT:  New Prescriptions   No medications on file     Note:  This document was prepared using Dragon voice recognition software and may include unintentional dictation errors.    Willy EddyPatrick Carmon Brigandi, MD 08/07/16 2114

## 2016-08-07 NOTE — ED Triage Notes (Signed)
Pt from home; was contacted by his PCP office and told to come in because they think he is passing blood in his stool. He reports that he has had black, tarry stool x 3-4 days. Pt indicates that his ammonia level might be high, as he has hx of cirrhosis. NAD noted.

## 2016-08-07 NOTE — H&P (Signed)
Howard [name] is an 35 y.o. male.   Chief Complaint: Blood in stool and high ammonia level. HPI: Is a 35 year old male who has a history of alcoholic cirrhosis. He was visited by home health nursing today and found to be having black tarry looking stools. He recently had some labs drawn at Dr. Percell Boston office and found to have high ammonia. Dr. Percell Boston office was contacted by home health nursing and he was told to come into the ER. Here he is mildly confused and has guaiac-positive stools. He has a history of variceal bleeding. He also continues to drink beers.  Past Medical History:  Diagnosis Date  . Alcoholic cirrhosis of liver with ascites ([place])   . Anemia   . COPD (chronic obstructive pulmonary disease) ([place])   . Depression   . Diabetes ([place])   . Diabetes mellitus, type II ([place])   . Esophageal varices ([place])   . Heart disease   . Hyperlipemia   . Hypertension   . Liver disease   . Multiple thyroid nodules   . Portal hypertensive gastropathy   . Schizophrenia [facility])     Past Surgical History:  Procedure Laterality Date  . ESOPHAGOGASTRODUODENOSCOPY N/A 10/05/2015   Procedure: ESOPHAGOGASTRODUODENOSCOPY (EGD);  Surgeon: Josefine Class, MD;  Location[facility] END[facility]COPY;  Service: Endoscopy;  Laterality: N/A;  . ESOPHAGOGASTRODUODENOSCOPY (EGD) WITH PROPOFOL N/A 08/03/2015   Procedure: ESOPHAGOGASTRODUODENOSCOPY (EGD) WITH PROPOFOL;  Surgeon: Josefine Class, MD;  Location: [facility] ENDOSCOPY;  Service: Endoscopy;  Laterality: N/A;  . ESOPHAGOGASTRODUODENOSCOPY (EGD) WITH PROPOFOL N/A 08/31/2015   Procedure: ESOPHAGOGASTRODUODENOSCOPY (EGD) WITH PROPOFOL;  Surgeon: Josefine Class, MD;  Location[facility] ENDOSCOPY;  Service: Endoscopy;  Laterality: N/A;  . ESOPHAGOGASTRODUODENOSCOPY (EGD) WITH PROPOFOL N/A 04/04/2016   Procedure: ESOPHAGOGASTRODUODENOSCOPY (EGD) WITH PROPOFOL;  Surgeon: Manya Silvas, MD;  Location: [facility] ENDOSCOPY;  Service: Endoscopy;  Laterality: N/A;  . NO  PAST SURGERIES      Family History  Problem Relation Age of Onset  . Heart disease Mother   . Hypertension Mother   . Hyperlipidemia Mother   . Stroke Father   . Heart attack Father   . Hypertension Father   . Heart disease Father   . Alcohol abuse Father   . Heart disease Brother    Social History:  reports that he has never smoked. He has never used smokeless tobacco. He reports that he drinks about 30.0 oz of alcohol per week . He reports that he does not use drugs.  Allergies: No Known Allergies   (Not in a hospital admission)  Results for orders placed or performed during the hospital encounter of 08/07/16 (from the past 48 hour(s))  Comprehensive metabolic panel     Status: Abnormal   Collection Time: 08/07/16 12:35 PM  Result Value Ref Range   Sodium 138 135 - 145 mmol/L   Potassium 4.8 3.5 - 5.1 mmol/L   Chloride 101 101 - 111 mmol/L   CO2 30 22 - 32 mmol/L   Glucose, Bld 148 (H) 65 - 99 mg/dL   BUN 18 6 - 20 mg/dL   Creatinine, Ser 1.01 0.61 - 1.24 mg/dL   Calcium 8.5 (L) 8.9 - 10.3 mg/dL   Total Protein 7.6 6.5 - 8.1 g/dL   Albumin 3.4 (L) 3.5 - 5.0 g/dL   AST 36 15 - 41 U/L   ALT 27 17 - 63 U/L   Alkaline Phosphatase 118 38 - 126 U/L   Total Bilirubin 0.3 0.3 - 1.2 mg/dL  GFR calc non Af Amer >60 >60 mL/min   GFR calc Af Amer >60 >60 mL/min    Comment: (NOTE) The eGFR has been calculated using the CKD EPI equation. This calculation has not been validated in all clinical situations. eGFR's persistently <60 mL/min signify possible Chronic Kidney Disease.    Anion gap 7 5 - 15  Type and screen [place]     Status: None   Collection Time: 08/07/16 12:36 PM  Result Value Ref Range   ABO/RH(D) O POS    Antibody Screen NEG    Sample Expiration 08/10/2016   Protime-INR     Status: None   Collection Time: 08/07/16  1:02 PM  Result Value Ref Range   Prothrombin Time 14.8 11.4 - 15.2 seconds   INR 1.15   CBC     Status: Abnormal    Collection Time: 08/07/16  2:40 PM  Result Value Ref Range   WBC 7.7 3.8 - 10.6 K/uL   RBC 3.69 (L) 4.40 - 5.90 MIL/uL   Hemoglobin 8.7 (L) 13.0 - 18.0 g/dL   HCT 28.6 (L) 40.0 - 52.0 %   MCV 77.5 (L) 80.0 - 100.0 fL   MCH 23.6 (L) 26.0 - 34.0 pg   MCHC 30.4 (L) 32.0 - 36.0 g/dL   RDW 19.2 (H) 11.5 - 14.5 %   Platelets 127 (L) 150 - 440 K/uL  Ammonia     Status: Abnormal   Collection Time: 08/07/16  2:40 PM  Result Value Ref Range   Ammonia 115 (H) 9 - 35 umol/L   No results found.  Review of Systems  Constitutional: Negative for chills and fever.  HENT: Negative for hearing loss.   Eyes: Negative for blurred vision.  Respiratory: Positive for sputum production. Negative for cough.   Cardiovascular: Negative for chest pain.  Gastrointestinal: Positive for blood in stool.  Genitourinary: Negative for dysuria.  Musculoskeletal: Positive for joint pain.  Skin: Negative for rash.  Neurological: Negative for sensory change.    Blood pressure 133/71, pulse 78, temperature 98.5 F (36.9 C), resp. rate 19, height _0  (1.905 m), weight (!) 162.4 kg (358 lb), SpO2 97 %. Physical Exam  Constitutional:  Obese male who appears confused.  HENT:  Head: Atraumatic.  Mouth/Throat: Oropharynx is clear and moist. No oropharyngeal exudate.  Eyes: Pupils are equal, round, and reactive to light. Left eye exhibits no discharge. No scleral icterus.  Neck: Neck supple. No JVD present. No tracheal deviation present. No thyromegaly present.  Cardiovascular: Normal rate and regular rhythm.   No murmur heard. Respiratory:  Clear to auscultation No use of accessory muscles.  GI: Soft. Bowel sounds are normal.  Abdomen is distended but not called.  Musculoskeletal:  2+ lower extremity edema.  Lymphadenopathy:    He has no cervical adenopathy.  Neurological:  Is alert and answers questions seemingly appropriately. However his mother who is present since at least a little more confused than  baseline.  Skin: Skin is dry.     Assessment/Plan 1. GI bleeding. Suspicious for variceal bleeding. He is being started on octreotide drip. Also a Protonix drip. Gastroenterology has been consulted. We'll check frequent hemoglobins. He has been typed and screened in case he needs transfusion.  2. Hyperammonia. He is supposed be on lactulose and Xifaxan at home. However his mother who is present says he is often noncompliant with it. He does appear to be mildly confused. We'll go ahead and restart these medications and recheck  ammonia level daily.  3. Ascites. Abdomen has been distended and according to the mother home health nursing is says it's been increasing as a measured each time the come out. It is not called so I do not feel he needs acute paracentesis. Not sure if he is compliant on his spironolactone and Lasix. Go ahead and continue these medications and monitor.  4. Diabetes. We'll hold his Glucophage place him on sliding scale insulin.  5. Alcoholic cirrhosis. He continues to drink alcohol. We'll need to counsel him on this when he is out of this acute confusion.  Time spent was 50 minutes.  Baxter Hire, MD 08/07/2016, 5:18 PM

## 2016-08-07 NOTE — ED Notes (Signed)
This RN assisted with rectal exam, pt tolerated well.

## 2016-08-08 DIAGNOSIS — K7031 Alcoholic cirrhosis of liver with ascites: Secondary | ICD-10-CM

## 2016-08-08 DIAGNOSIS — K922 Gastrointestinal hemorrhage, unspecified: Secondary | ICD-10-CM

## 2016-08-08 LAB — COMPREHENSIVE METABOLIC PANEL
ALBUMIN: 3.4 g/dL — AB (ref 3.5–5.0)
ALT: 28 U/L (ref 17–63)
ANION GAP: 3 — AB (ref 5–15)
AST: 37 U/L (ref 15–41)
Alkaline Phosphatase: 101 U/L (ref 38–126)
BUN: 15 mg/dL (ref 6–20)
CHLORIDE: 103 mmol/L (ref 101–111)
CO2: 35 mmol/L — AB (ref 22–32)
Calcium: 8.5 mg/dL — ABNORMAL LOW (ref 8.9–10.3)
Creatinine, Ser: 0.89 mg/dL (ref 0.61–1.24)
GFR calc Af Amer: >60 mL/min (ref >60)
GFR calc non Af Amer: >60 mL/min (ref >60)
GLUCOSE: 105 mg/dL — AB (ref 65–99)
POTASSIUM: 4.5 mmol/L (ref 3.5–5.1)
SODIUM: 141 mmol/L (ref 135–145)
Total Bilirubin: 0.8 mg/dL (ref 0.3–1.2)
Total Protein: 7.9 g/dL (ref 6.5–8.1)

## 2016-08-08 LAB — HEMOGLOBIN
HEMOGLOBIN: 8.8 g/dL — AB (ref 13.0–18.0)
HEMOGLOBIN: 8.5 g/dL — AB (ref 13.0–18.0)
Hemoglobin: 8.6 g/dL — ABNORMAL LOW (ref 13.0–18.0)
Hemoglobin: 8.9 g/dL — ABNORMAL LOW (ref 13.0–18.0)

## 2016-08-08 LAB — GLUCOSE, CAPILLARY
GLUCOSE-CAPILLARY: 149 mg/dL — AB (ref 65–99)
GLUCOSE-CAPILLARY: 96 mg/dL (ref 65–99)
Glucose-Capillary: 135 mg/dL — ABNORMAL HIGH (ref 65–99)
Glucose-Capillary: 133 mg/dL — ABNORMAL HIGH (ref 65–99)

## 2016-08-08 LAB — AMMONIA: AMMONIA: 117 umol/L — AB (ref 9–35)

## 2016-08-08 MED ORDER — LACTULOSE 10 GM/15ML PO SOLN
45.0000 g | ORAL | Status: DC
  Administered 2016-08-08 – 2016-08-12 (×25): 45 g via ORAL
  Filled 2016-08-08: qty 90
  Filled 2016-08-08: qty 68
  Filled 2016-08-08 (×4): qty 90
  Filled 2016-08-08: qty 68
  Filled 2016-08-08 (×3): qty 90
  Filled 2016-08-08: qty 30
  Filled 2016-08-08 (×8): qty 90
  Filled 2016-08-08: qty 68
  Filled 2016-08-08 (×8): qty 90
  Filled 2016-08-08: qty 68
  Filled 2016-08-08 (×2): qty 90

## 2016-08-08 MED ORDER — NADOLOL 20 MG PO TABS
20.0000 mg | ORAL_TABLET | Freq: Every day | ORAL | Status: DC
  Administered 2016-08-09 – 2016-08-12 (×4): 20 mg via ORAL
  Filled 2016-08-08 (×4): qty 1

## 2016-08-08 MED ORDER — SODIUM CHLORIDE 0.9 % IV BOLUS (SEPSIS)
1000.0000 mL | Freq: Once | INTRAVENOUS | Status: AC
  Administered 2016-08-08: 1000 mL via INTRAVENOUS

## 2016-08-08 NOTE — Care Management (Signed)
Patient admitted from home with GI bleed.  Patient lives at home with his brother and mother.  Patient open with [facility] health [facility].  Patient has chronic home O2, tank at bedsiide.  RW, and cane.  Mother provides transportation, and patients has an [employer] WC.  PCP [name].  Home health agency has requested Psych evaluation while inpatient.  I have notified MD.  RNCM following for discharge planning.

## 2016-08-08 NOTE — Progress Notes (Signed)
Palliative Medicine Consult Order Noted. Due to limited staffing, there will be a delay seeing this patient. Palliative Medicine [facility], and patient will be evaluated then. Please call the Palliative Medicine Team office at [phone] if recommendations are needed in the interim.  Thank you for inviting us to see this patient.  Margret ChanceMelanie G. Ciara Kagan, RN, BSN, [facility] 08/08/2016 8:44 AM Cell [phone] 8:00-4:00 Monday-Friday Office [phone]

## 2016-08-08 NOTE — Care Management Important Message (Signed)
Important Message  Patient Details  Name: Howard LinemanJoey A Muscarella MRN: [id number] Date of Birth: [birth year]   Medicare Important Message Given:  Yes    Chapman FitchBOWEN, Jacqueli Pangallo T, RN 08/08/2016, 9:55 AM

## 2016-08-08 NOTE — Progress Notes (Signed)
Patient ID: Howard [name], male   DOB: 07/24/1981, 35 y.o.   MRN: [id number]  [employer] PROGRESS NOTE  Howard [name] [id number] DOB: 07/24/1981 DOA: 08/07/2016 PCP: Marisue IvanLINTHAVONG, [name], MD  HPI/Subjective: Patient seen earlier and was able to answer some questions. Still very confused. Slow with his answers. States he hasn't had a bowel movement in a few days. When he did have a bowel movement it was dark and then he also saw bright red blood  Objective: Vitals:   08/08/16 1349 08/08/16 1350  BP: (!) 99/39 106/75  Pulse: 90   Resp: 16   Temp: 98.8 F (37.1 C)     Filed Weights   08/07/16 1230  Weight: (!) 162.4 kg (358 lb)    ROS: Review of Systems  Constitutional: Negative for chills and fever.  Eyes: Negative for blurred vision.  Respiratory: Negative for cough and shortness of breath.   Cardiovascular: Negative for chest pain.  Gastrointestinal: Positive for blood in stool and constipation. Negative for abdominal pain, diarrhea, nausea and vomiting.  Genitourinary: Negative for dysuria.  Musculoskeletal: Negative for joint pain.  Neurological: Negative for dizziness and headaches.   Exam: Physical Exam  Constitutional: He appears lethargic.  HENT:  Nose: No mucosal edema.  Mouth/Throat: No oropharyngeal exudate or posterior oropharyngeal edema.  Eyes: Conjunctivae, EOM and lids are normal. Pupils are equal, round, and reactive to light.  Neck: No JVD present. Carotid bruit is not present. No edema present. No thyroid mass and no thyromegaly present.  Cardiovascular: S1 normal and S2 normal.  Exam reveals no gallop.   No murmur heard. Pulses:      Dorsalis pedis pulses are 2+ on the right side, and 2+ on the left side.  Respiratory: No respiratory distress. He has decreased breath sounds in the right lower field and the left lower field. He has no wheezes. He has no rhonchi. He has no rales.  GI: Soft. Bowel sounds are normal. There is no tenderness.   Musculoskeletal:       Right ankle: He exhibits no swelling.       Left ankle: He exhibits no swelling.  Lymphadenopathy:    He has no cervical adenopathy.  Neurological: He appears lethargic.  Skin: Skin is warm. No rash noted. Nails show no clubbing.  Psychiatric: His speech is delayed.      Data Reviewed: Basic Metabolic Panel:  Recent Labs Lab 08/07/16 1235 08/08/16 0148  NA 138 141  K 4.8 4.5  CL 101 103  CO2 30 35*  GLUCOSE 148* 105*  BUN 18 15  CREATININE 1.01 0.89  CALCIUM 8.5* 8.5*   Liver Function Tests:  Recent Labs Lab 08/07/16 1235 08/08/16 0148  AST 36 37  ALT 27 28  ALKPHOS 118 101  BILITOT 0.3 0.8  PROT 7.6 7.9  ALBUMIN 3.4* 3.4*     Recent Labs Lab 08/07/16 1440 08/08/16 0148  AMMONIA 115* 117*   CBC:  Recent Labs Lab 08/07/16 1440 08/07/16 2008 08/08/16 0148 08/08/16 0830 08/08/16 1356  WBC 7.7  --   --   --   --   HGB 8.7* 8.8* 8.6* 8.8* 8.5*  HCT 28.6*  --   --   --   --   MCV 77.5*  --   --   --   --   PLT 127*  --   --   --   --     CBG:  Recent Labs Lab 08/07/16 2246 08/08/16 0741 08/08/16  1202 08/08/16 1647  GLUCAP 96 149* 135* 133*    Scheduled Meds: . acamprosate  666 mg Oral TID WC  . citalopram  20 mg Oral BH-q7a  . furosemide  20 mg Oral Daily  . insulin aspart  0-5 Units Subcutaneous QHS  . insulin aspart  0-9 Units Subcutaneous TID WC  . lactulose  45 g Oral Q4H  . levETIRAcetam  1,000 mg Oral BID  . losartan  25 mg Oral Daily  . multivitamin with minerals  1 tablet Oral Daily  . nadolol  20 mg Oral Daily  . OLANZapine  30 mg Oral QHS  . rifaximin  550 mg Oral BID  . sodium chloride flush  3 mL Intravenous Q12H  . spironolactone  50 mg Oral BID  . sucralfate  1 g Oral QID   Continuous Infusions: . octreotide  (SANDOSTATIN)    IV infusion 50 mcg/hr (08/08/16 1342)  . pantoprozole (PROTONIX) infusion 8 mg/hr (08/08/16 0649)    Assessment/Plan:  1. Hepatic encephalopathy. The patient gets  into issues when he doesn't have bowel movements for a few days. Increase lactulose to 45 g every 4 hours. As per the nurse he still has not had a bowel movement yet. Continue Xifaxan. 2. GI bleed. Hemoglobin remains relatively stable. Patient on Protonix and octreotide drip. 3. Relative hypotension. Hold spironolactone and losartan. Holding parameters on nadolol 4. History of seizure on Keppra 5. Depression continue Celexa and olanzapine  Code Status:     Code Status Orders        Start     Ordered   08/07/16 1938  Full code  Continuous     08/07/16 1937    Code Status History    Date Active Date Inactive Code Status Order ID Comments User Context   07/07/2016  1:40 AM 07/09/2016  6:29 PM Full Code [id number]  Ihor AustinPavan Pyreddy, MD Inpatient   06/10/2016  6:21 AM 06/11/2016  8:25 PM Full Code [id number]  Ihor AustinPavan Pyreddy, MD Inpatient   05/12/2016  3:55 AM 05/15/2016  2:57 PM Full Code [id number]  Ihor AustinPavan Pyreddy, MD Inpatient   04/28/2016  2:54 AM 04/29/2016  6:23 PM Full Code [id number]  Alexis Hugelmeyer, DO Inpatient   03/14/2016 12:30 PM 03/16/2016  3:41 PM Full Code [id number]  Shaune PollackQing Chen, MD Inpatient   03/03/2016 12:13 PM 03/06/2016  2:26 PM Full Code [id number]  Milagros LollSrikar Sudini, MD ED   02/10/2016  5:54 PM 02/12/2016  7:34 PM Full Code [id number]  Ramonita LabAruna Gouru, MD Inpatient   11/11/2015  5:16 AM 11/13/2015  6:00 PM Full Code [id number]  Ihor AustinPavan Pyreddy, MD Inpatient   11/07/2015 10:02 PM 11/09/2015  4:56 PM Full Code [id number]  Enid Baasadhika Kalisetti, MD Inpatient   10/28/2015  8:14 PM 10/29/2015  2:26 PM Full Code [id number]  Altamese DillingVaibhavkumar Vachhani, MD Inpatient   10/16/2015 11:47 PM 10/17/2015  6:00 PM Full Code [id number]  Oralia Manisavid Willis, MD Inpatient   07/30/2015  5:35 PM 08/04/2015  8:34 PM Full Code [id number]  Enedina FinnerSona Patel, MD Inpatient   07/05/2015  1:16 AM 07/08/2015  7:22 PM Full Code [id number]  Oralia Manisavid Willis, MD Inpatient   05/30/2015  7:28 PM 06/01/2015  3:36 PM Full Code [id number]  Audery AmelJohn T [name], MD Inpatient      Disposition Plan: To be determined  Consultants:  Gastroenterology  Palliative care  Time spent: 28 minutes  Alford HighlandWIETING, Valen Mascaro  [employer]

## 2016-08-08 NOTE — Consult Note (Signed)
Midge Miniumarren Azhar Yogi, MD [facility]  [address] [place], [place] [address] Phone: [phone] Fax : [phone]  Consultation  Referring Provider:     No ref. provider found Primary Care Physician:  Marisue IvanLINTHAVONG, [name], MD Primary Gastroenterologist:  Dr. Mechele CollinElliott          Reason for Consultation:     Positive stools  Date of Admission:  08/07/2016 Date of Consultation:  08/08/2016         HPI:   Howard [name] is a 35 y.o. male Who has a history of alcoholic cirrhosis and was reported to have black tarry stools.  The patient's hemoglobin is at his baseline.  The patient was also noted to have heme positive stools.  The patient was also noted to be confused.  The patient's baseline is uncertain and when seeing him today he seems very sleepy and not wanting to answer any questions.  The patient continues to drink alcohol and is usually drinking beer.The patient has had multiple EGDs in the past with banding of his esophageal varices.  Past Medical History:  Diagnosis Date  . Alcoholic cirrhosis of liver with ascites ([facility])   . Anemia   . COPD (chronic obstructive pulmonary disease) ([facility])   . Depression   . Diabetes ([facility])   . Diabetes mellitus, type II ([facility])   . Esophageal varices ([facility])   . Heart disease   . Hyperlipemia   . Hypertension   . Liver disease   . Multiple thyroid nodules   . Portal hypertensive gastropathy   . Schizophrenia [facility](H[facility])     Past Surgical History:  Procedure Laterality Date  . ESOPHAGOGASTRODUODENOSCOPY N/A 10/05/2015   Procedure: ESOPHAGOGASTRODUODENOSCOPY (EGD);  Surgeon: Elnita MaxwellMatthew Gordon Rein, MD;  [facility];  Service: Endoscopy;  Laterality: N/A;  . ESOPHAGOGASTRODUODENOSCOPY (EGD) WITH PROPOFOL N/A 08/03/2015   Procedure: ESOPHAGOGASTRODUODENOSCOPY (EGD) WITH PROPOFOL;  Surgeon: Elnita MaxwellMatthew Gordon Rein, MD;  [facility];  Service: Endoscopy;  Laterality: N/A;  . ESOPHAGOGASTRODUODENOSCOPY (EGD) WITH PROPOFOL N/A 08/31/2015   Procedure: ESOPHAGOGASTRODUODENOSCOPY (EGD) WITH PROPOFOL;  Surgeon: Elnita MaxwellMatthew Gordon Rein, MD;  [facility];  Service: Endoscopy;  Laterality: N/A;  . ESOPHAGOGASTRODUODENOSCOPY (EGD) WITH PROPOFOL N/A 04/04/2016   Procedure: ESOPHAGOGASTRODUODENOSCOPY (EGD) WITH PROPOFOL;  Surgeon: Scot Junobert T Elliott, MD;  [facility];  Service: Endoscopy;  Laterality: N/A;  . NO PAST SURGERIES      Prior to Admission medications   Medication Sig Start Date End Date Taking? Authorizing Provider  acamprosate (CAMPRAL) 333 MG tablet Take 2 tablets (666 mg total) by mouth 3 (three) times daily with meals. 06/19/16  Yes Audery AmelJohn T [name], MD  citalopram (CELEXA) 20 MG tablet Take 20 mg by mouth every morning. 04/08/16  Yes Historical Provider, MD  furosemide (LASIX) 20 MG tablet Take 1 tablet (20 mg total) by mouth daily. 05/15/16  Yes Richard Renae GlossWieting, MD  lactulose (CHRONULAC) 10 GM/15ML solution Take 45 mLs (30 g total) by mouth every 4 (four) hours. 07/09/16  Yes Katharina Caperima Vaickute, MD  levETIRAcetam (KEPPRA) 1000 MG tablet Take 1 tablet (1,000 mg total) by mouth 2 (two) times daily. 07/09/16  Yes Katharina Caperima Vaickute, MD  losartan (COZAAR) 25 MG tablet Take 25 mg by mouth daily.   Yes Historical Provider, MD  metFORMIN (GLUCOPHAGE) 500 MG tablet Take 1 tablet (500 mg total) by mouth 2 (two) times daily with a meal. 05/15/16  Yes Alford Highlandichard Wieting, MD  Multiple Vitamin (MULTIVITAMIN WITH MINERALS) TABS tablet Take 1 tablet by mouth daily. 08/04/15  Yes Ramonita LabAruna Gouru, MD  nadolol (CORGARD) 20 MG tablet Take 20 mg by mouth daily.   Yes Historical Provider, MD  OLANZapine (ZYPREXA) 15 MG tablet Take 2 tablets (30 mg total) by mouth at bedtime. 05/22/16  Yes Audery AmelJohn T [name], MD  omeprazole (PRILOSEC) 40 MG capsule Take 1 capsule (40 mg total) by mouth every morning. 02/12/16  Yes Enid Baasadhika Kalisetti, MD  rifaximin (XIFAXAN) 550 MG TABS tablet Take 1 tablet (550 mg total) by mouth 2 (two) times daily. 07/09/16  Yes Katharina Caperima  Vaickute, MD  spironolactone (ALDACTONE) 50 MG tablet Take 1 tablet (50 mg total) by mouth 2 (two) times daily. 07/09/16  Yes Katharina Caperima Vaickute, MD  sucralfate (CARAFATE) 1 g tablet Take 1 g by mouth 4 (four) times daily.   Yes Historical Provider, MD    Family History  Problem Relation Age of Onset  . Heart disease Mother   . Hypertension Mother   . Hyperlipidemia Mother   . Stroke Father   . Heart attack Father   . Hypertension Father   . Heart disease Father   . Alcohol abuse Father   . Heart disease Brother      Social History  Substance Use Topics  . Smoking status: Never Smoker  . Smokeless tobacco: Never Used  . Alcohol use 30.0 oz/week    50 Cans of beer per week     Comment: 5 beers yesterday    Allergies as of 08/07/2016  . (No Known Allergies)    Review of Systems:    All systems reviewed and negative except where noted in HPI.   Physical Exam:  Vital signs in last 24 hours: Temp:  [98.2 F (36.8 C)-98.8 F (37.1 C)] 98.8 F (37.1 C) (11/17 1349) Pulse Rate:  [80-90] 90 (11/17 1349) Resp:  [16-18] 16 (11/17 1349) BP: (99-143)/(39-75) 106/75 (11/17 1350) SpO2:  [90 %-96 %] 90 % (11/17 1349) Last BM Date: 08/07/16 General:   Pleasant, cooperative in NAD Head:  Normocephalic and atraumatic. Eyes:   No icterus.   Conjunctiva pink. PERRLA. Ears:  Normal auditory acuity. Neck:  Supple; no masses or thyroidomegaly Lungs: Respirations even and unlabored. Lungs clear to auscultation bilaterally.   No wheezes, crackles, or rhonchi.  Heart:  Regular rate and rhythm;  Without murmur, clicks, rubs or gallops Abdomen:  Soft, distended, nontender. Normal bowel sounds. No appreciable masses or hepatomegaly. Positive for ascites. No rebound or guarding.  Rectal:  Not performed. Msk:  Symmetrical without gross deformities.    Extremities:  Without edema, cyanosis or clubbing. Neurologic:  Alert and oriented x3;  grossly normal neurologically. Skin:  Intact without  significant lesions or rashes. Cervical Nodes:  No significant cervical adenopathy. Psych:  Alert and cooperative. Normal affect.  LAB RESULTS:  Recent Labs  08/07/16 1440  08/08/16 0148 08/08/16 0830 08/08/16 1356  WBC 7.7  --   --   --   --   HGB 8.7*  < > 8.6* 8.8* 8.5*  HCT 28.6*  --   --   --   --   PLT 127*  --   --   --   --   < > = values in this interval not displayed. BMET  Recent Labs  08/07/16 1235 08/08/16 0148  NA 138 141  K 4.8 4.5  CL 101 103  CO2 30 35*  GLUCOSE 148* 105*  BUN 18 15  CREATININE 1.01 0.89  CALCIUM 8.5* 8.5*   LFT  Recent Labs  08/08/16  0148  PROT 7.9  ALBUMIN 3.4*  AST 37  ALT 28  ALKPHOS 101  BILITOT 0.8   PT/INR  Recent Labs  08/07/16 1302  LABPROT 14.8  INR 1.15    STUDIES: No results found.    Impression / Plan:   Howard [name] is a 35 y.o. y/o male with a history of alcoholic cirrhosis who continues to drink beer. The patient hemoglobin was 8.5 which is around his baseline.  He has had multiple EGDs with banding of his varices and he is on Nadolol for his portal hypertension.  There is no plan to perform an upper endoscopy on this patient unless he has any sign of active GI bleeding such as a drop in his hemoglobin and continued melena.The patient will be continued on lactulose and Xifaxan.  Thank you for involving me in the care of this patient.      LOS: 1 day   Midge Minium, MD  08/08/2016, 7:20 PM   Note: This dictation was prepared with Dragon dictation along with smaller phrase technology. Any transcriptional errors that result from this process are unintentional.

## 2016-08-08 NOTE — Progress Notes (Signed)
[facility]  Patient Status: Active  [facility] is providing the following services: SN  If patient discharges after hours, please call [phone].   Howard CaseyJason E [name] 08/08/2016, 9:48 AM

## 2016-08-09 LAB — GLUCOSE, CAPILLARY
GLUCOSE-CAPILLARY: 67 mg/dL (ref 65–99)
GLUCOSE-CAPILLARY: 80 mg/dL (ref 65–99)
GLUCOSE-CAPILLARY: 71 mg/dL (ref 65–99)
GLUCOSE-CAPILLARY: 127 mg/dL — AB (ref 65–99)
Glucose-Capillary: 129 mg/dL — ABNORMAL HIGH (ref 65–99)
Glucose-Capillary: 140 mg/dL — ABNORMAL HIGH (ref 65–99)

## 2016-08-09 LAB — HEMOGLOBIN
HEMOGLOBIN: 8.5 g/dL — AB (ref 13.0–18.0)
Hemoglobin: 8 g/dL — ABNORMAL LOW (ref 13.0–18.0)

## 2016-08-09 LAB — CBC
HEMATOCRIT: 26.4 % — AB (ref 40.0–52.0)
Hemoglobin: 7.9 g/dL — ABNORMAL LOW (ref 13.0–18.0)
MCH: 23.3 pg — ABNORMAL LOW (ref 26.0–34.0)
MCHC: 30 g/dL — ABNORMAL LOW (ref 32.0–36.0)
MCV: 77.9 fL — AB (ref 80.0–100.0)
Platelets: 102 10*3/uL — ABNORMAL LOW (ref 150–440)
RBC: 3.38 MIL/uL — AB (ref 4.40–5.90)
RDW: 19.1 % — ABNORMAL HIGH (ref 11.5–14.5)
WBC: 5.5 10*3/uL (ref 3.8–10.6)

## 2016-08-09 LAB — AMMONIA: AMMONIA: 76 umol/L — AB (ref 9–35)

## 2016-08-09 MED ORDER — VITAMIN B-1 100 MG PO TABS
100.0000 mg | ORAL_TABLET | Freq: Every day | ORAL | Status: DC
  Administered 2016-08-10 – 2016-08-12 (×3): 100 mg via ORAL
  Filled 2016-08-09 (×3): qty 1

## 2016-08-09 MED ORDER — PANTOPRAZOLE SODIUM 40 MG PO TBEC
40.0000 mg | DELAYED_RELEASE_TABLET | Freq: Two times a day (BID) | ORAL | Status: DC
  Administered 2016-08-09 – 2016-08-12 (×6): 40 mg via ORAL
  Filled 2016-08-09 (×6): qty 1

## 2016-08-09 MED ORDER — CHLORDIAZEPOXIDE HCL 25 MG PO CAPS: 25.0000 mg | ORAL_CAPSULE | Freq: Four times a day (QID) | ORAL | Status: AC | PRN

## 2016-08-09 MED ORDER — THIAMINE HCL 100 MG/ML IJ SOLN
100.0000 mg | Freq: Once | INTRAMUSCULAR | Status: AC
  Administered 2016-08-09: 100 mg via INTRAMUSCULAR
  Filled 2016-08-09: qty 2

## 2016-08-09 NOTE — Progress Notes (Signed)
Patient ID: Howard [name], male   DOB: Nov 09, 1980, 35 y.o.   MRN: [id number]  [facility] PROGRESS NOTE  Howard [name] XBM:[id number] DOB: Nov 09, 1980 DOA: 08/07/2016 PCP: Marisue IvanLINTHAVONG, [name], MD  HPI/Subjective: Patient more lethargic today. He does answer some questions but falls back asleep easily. As per nurse had a bowel movement today.  Objective: Vitals:   08/09/16 0649 08/09/16 1301  BP: 136/66 (!) 97/50  Pulse: 81 88  Resp: 20 20  Temp: 98.3 F (36.8 C) 99.3 F (37.4 C)    Filed Weights   08/07/16 1230  Weight: (!) 162.4 kg (358 lb)    ROS: Review of Systems  Constitutional: Negative for chills and fever.  Eyes: Negative for blurred vision.  Respiratory: Negative for cough and shortness of breath.   Cardiovascular: Negative for chest pain.  Gastrointestinal: Negative for abdominal pain, constipation, diarrhea, nausea and vomiting.  Genitourinary: Negative for dysuria.  Musculoskeletal: Negative for joint pain.  Neurological: Negative for dizziness and headaches.   Exam: Physical Exam  Constitutional: He appears lethargic.  HENT:  Nose: No mucosal edema.  Mouth/Throat: No oropharyngeal exudate or posterior oropharyngeal edema.  Eyes: Conjunctivae, EOM and lids are normal. Pupils are equal, round, and reactive to light.  Neck: No JVD present. Carotid bruit is not present. No edema present. No thyroid mass and no thyromegaly present.  Cardiovascular: S1 normal and S2 normal.  Exam reveals no gallop.   No murmur heard. Pulses:      Dorsalis pedis pulses are 2+ on the right side, and 2+ on the left side.  Respiratory: No respiratory distress. He has decreased breath sounds in the right lower field and the left lower field. He has no wheezes. He has no rhonchi. He has no rales.  GI: Soft. Bowel sounds are normal. There is no tenderness.  Musculoskeletal:       Right ankle: He exhibits no swelling.       Left ankle: He exhibits no swelling.  Lymphadenopathy:    He has no cervical adenopathy.  Neurological: He appears lethargic.  Skin: Skin is warm. No rash noted. Nails show no clubbing.  Psychiatric: His speech is delayed.      Data Reviewed: Basic Metabolic Panel:  Recent Labs Lab 08/07/16 1235 08/08/16 0148  NA 138 141  K 4.8 4.5  CL 101 103  CO2 30 35*  GLUCOSE 148* 105*  BUN 18 15  CREATININE 1.01 0.89  CALCIUM 8.5* 8.5*   Liver Function Tests:  Recent Labs Lab 08/07/16 1235 08/08/16 0148  AST 36 37  ALT 27 28  ALKPHOS 118 101  BILITOT 0.3 0.8  PROT 7.6 7.9  ALBUMIN 3.4* 3.4*     Recent Labs Lab 08/07/16 1440 08/08/16 0148 08/09/16 0204  AMMONIA 115* 117* 76*   CBC:  Recent Labs Lab 08/07/16 1440  08/08/16 1356 08/08/16 1919 08/09/16 0204 08/09/16 0737 08/09/16 1308  WBC 7.7  --   --   --   --   --  5.5  HGB 8.7*  < > 8.5* 8.9* 8.0* 8.5* 7.9*  HCT 28.6*  --   --   --   --   --  26.4*  MCV 77.5*  --   --   --   --   --  77.9*  PLT 127*  --   --   --   --   --  102*  < > = values in this interval not displayed.  CBG:  Recent Labs  Lab 08/08/16 1202 08/08/16 1647 08/08/16 2223 08/09/16 0752 08/09/16 1150  GLUCAP 135* 133* 96 129* 140*    Scheduled Meds: . acamprosate  666 mg Oral TID WC  . citalopram  20 mg Oral BH-q7a  . furosemide  20 mg Oral Daily  . insulin aspart  0-5 Units Subcutaneous QHS  . insulin aspart  0-9 Units Subcutaneous TID WC  . lactulose  45 g Oral Q4H  . levETIRAcetam  1,000 mg Oral BID  . multivitamin with minerals  1 tablet Oral Daily  . nadolol  20 mg Oral Daily  . OLANZapine  30 mg Oral QHS  . pantoprazole  40 mg Oral BID AC  . rifaximin  550 mg Oral BID  . sodium chloride flush  3 mL Intravenous Q12H  . sucralfate  1 g Oral QID  . [START ON 08/10/2016] thiamine  100 mg Oral Daily   Continuous Infusions:   Assessment/Plan:  1. Hepatic encephalopathy. The patient gets into issues when he doesn't have bowel movements for a few days. I Increased lactulose  to 45 g every 4 hours. Continue Xifaxan. Patient will need to have 3 bowel movements a day at least. Ammonia level down to 76 today. His mental status may be delayed from where his ammonia level is. 2. GI bleed. Hemoglobin remains relatively stable. Stop octreotide drip. Change Protonix to oral. Advance diet to full liquid diet 3. Relative hypotension. Hold spironolactone and losartan. Holding parameters on nadolol 4. History of seizure on Keppra 5. Depression continue Celexa and olanzapine 6. Weakness. Physical therapy evaluation 7. Alcohol abuse. Put on Librium when necessary Ciwa protocol. 8. Repeated admissions for similar issues. Poor insight. Palliative care consultation requested but unable to be done until Monday.  Code Status:     Code Status Orders        Start     Ordered   08/07/16 1938  Full code  Continuous     08/07/16 1937    Code Status History    Date Active Date Inactive Code Status Order ID Comments User Context   07/07/2016  1:40 AM 07/09/2016  6:29 PM Full Code [id number]  Ihor Austin, MD Inpatient   06/10/2016  6:21 AM 06/11/2016  8:25 PM Full Code [id number]  Ihor Austin, MD Inpatient   05/12/2016  3:55 AM 05/15/2016  2:57 PM Full Code [id number]  Ihor Austin, MD Inpatient   04/28/2016  2:54 AM 04/29/2016  6:23 PM Full Code [id number]  Tonye Royalty, DO Inpatient   03/14/2016 12:30 PM 03/16/2016  3:41 PM Full Code [id number]  Shaune Pollack, MD Inpatient   03/03/2016 12:13 PM 03/06/2016  2:26 PM Full Code [id number]  Milagros Loll, MD ED   02/10/2016  5:54 PM 02/12/2016  7:34 PM Full Code [id number]  Ramonita Lab, MD Inpatient   11/11/2015  5:16 AM 11/13/2015  6:00 PM Full Code [id number]  Ihor Austin, MD Inpatient   11/07/2015 10:02 PM 11/09/2015  4:56 PM Full Code [id number]  Enid Baas, MD Inpatient   10/28/2015  8:14 PM 10/29/2015  2:26 PM Full Code [id number]  Altamese Dilling, MD Inpatient   10/16/2015 11:47 PM 10/17/2015  6:00 PM Full Code [id number]  Oralia Manis, MD  Inpatient   07/30/2015  5:35 PM 08/04/2015  8:34 PM Full Code [id number]  Enedina Finner, MD Inpatient   07/05/2015  1:16 AM 07/08/2015  7:22 PM Full Code [id number]  Oralia Manis, MD Inpatient   05/30/2015  7:28 PM 06/01/2015  3:36 PM Full Code  [id number]  Audery AmelJohn T [name], MD Inpatient     Disposition Plan: To be determined  Consultants:  Gastroenterology  Palliative care  Time spent: 28 minutes  Alford HighlandWIETING, Leotta Weingarten  [employer]

## 2016-08-09 NOTE — Progress Notes (Addendum)
[facility] Progress Note  Howard LinemanJoey A [name] 35 y.o. 03-21-1981   Subjective: Mother reports he was confused this morning. Patient denies abdominal pain. Hungry. Oriented X 3. Reports 2 BMs today.  Objective: Vital signs in last 24 hours: Vitals:   08/09/16 0649 08/09/16 1301  BP: 136/66 (!) 97/50  Pulse: 81 88  Resp: 20 20  Temp: 98.3 F (36.8 C) 99.3 F (37.4 C)    Physical Exam: Gen: lethargic, obese, no acute distress HEENT: anicteric sclera CV: RRR Chest: CTA B Abd: soft, nontender, nondistended, +BS Neuro: minimal asterixis  Lab Results:  Recent Labs  08/07/16 1235 08/08/16 0148  NA 138 141  K 4.8 4.5  CL 101 103  CO2 30 35*  GLUCOSE 148* 105*  BUN 18 15  CREATININE 1.01 0.89  CALCIUM 8.5* 8.5*    Recent Labs  08/07/16 1235 08/08/16 0148  AST 36 37  ALT 27 28  ALKPHOS 118 101  BILITOT 0.3 0.8  PROT 7.6 7.9  ALBUMIN 3.4* 3.4*    Recent Labs  08/07/16 1440  08/09/16 0737 08/09/16 1308  WBC 7.7  --   --  5.5  HGB 8.7*  < > 8.5* 7.9*  HCT 28.6*  --   --  26.4*  MCV 77.5*  --   --  77.9*  PLT 127*  --   --  102*  < > = values in this interval not displayed.  Recent Labs  08/07/16 1302  LABPROT 14.8  INR 1.15      Assessment/Plan: Hepatic Encephalopathy - improving on Lactulose and Xifaxan. Continue that regimen. If tolerates full liquids then give low sodium soft diet tomorrow. Supportive care. Will follow.   Howard [name] C. 08/09/2016, 1:53 PM

## 2016-08-10 ENCOUNTER — Inpatient Hospital Stay: Payer: Medicare Other

## 2016-08-10 LAB — BASIC METABOLIC PANEL
Anion gap: 5 (ref 5–15)
BUN: 12 mg/dL (ref 6–20)
CALCIUM: 8.8 mg/dL — AB (ref 8.9–10.3)
CHLORIDE: 98 mmol/L — AB (ref 101–111)
CO2: 37 mmol/L — AB (ref 22–32)
CREATININE: 0.83 mg/dL (ref 0.61–1.24)
GFR calc Af Amer: >60 mL/min (ref >60)
GFR calc non Af Amer: >60 mL/min (ref >60)
Glucose, Bld: 98 mg/dL (ref 65–99)
Potassium: 3.8 mmol/L (ref 3.5–5.1)
Sodium: 140 mmol/L (ref 135–145)

## 2016-08-10 LAB — BLOOD GAS, ARTERIAL
Acid-Base Excess: 12.2 mmol/L — ABNORMAL HIGH (ref 0.0–2.0)
Bicarbonate: 39.3 mmol/L — ABNORMAL HIGH (ref 20.0–28.0)
FIO2: 0.21
O2 Saturation: 90.2 %
PCO2 ART: 68 mmHg — AB (ref 32.0–48.0)
PH ART: 7.37 (ref 7.350–7.450)
Patient temperature: 37
pO2, Arterial: 61 mmHg — ABNORMAL LOW (ref 83.0–108.0)

## 2016-08-10 LAB — GLUCOSE, CAPILLARY
GLUCOSE-CAPILLARY: 155 mg/dL — AB (ref 65–99)
GLUCOSE-CAPILLARY: 100 mg/dL — AB (ref 65–99)
Glucose-Capillary: 103 mg/dL — ABNORMAL HIGH (ref 65–99)
Glucose-Capillary: 84 mg/dL (ref 65–99)

## 2016-08-10 LAB — AMMONIA: Ammonia: 49 umol/L — ABNORMAL HIGH (ref 9–35)

## 2016-08-10 NOTE — Progress Notes (Addendum)
Gastroenterology Progress Note  Howard LinemanJoey A [name] 35 y.o. [birth year]   Subjective: More alert today. Denies abdominal pain. Large loose brown stool yesterday. Mother reports that he will sometimes take a whole bottle of BC Powders at one time to help his arthritis. Ambulated in halls with PT.   Objective: Vital signs in last 24 hours: Vitals:   08/09/16 2030 08/10/16 0519  BP: 124/67 (!) 131/54  Pulse: 80 69  Resp: 18 16  Temp: 98.3 F (36.8 C) 97.6 F (36.4 C)    Physical Exam: Gen: lethargic, no acute distress, obese CV: RRR Chest: CTA B Abd: epigastric tenderness with minimal guarding, soft, nondistended, +BS Ext: no edema Neuro: no asterixis  Lab Results:  Recent Labs  08/08/16 0148 08/10/16 0505  NA 141 140  K 4.5 3.8  CL 103 98*  CO2 35* 37*  GLUCOSE 105* 98  BUN 15 12  CREATININE 0.89 0.83  CALCIUM 8.5* 8.8*    Recent Labs  08/07/16 1235 08/08/16 0148  AST 36 37  ALT 27 28  ALKPHOS 118 101  BILITOT 0.3 0.8  PROT 7.6 7.9  ALBUMIN 3.4* 3.4*    Recent Labs  08/07/16 1440  08/09/16 0737 08/09/16 1308  WBC 7.7  --   --  5.5  HGB 8.7*  < > 8.5* 7.9*  HCT 28.6*  --   --  26.4*  MCV 77.5*  --   --  77.9*  PLT 127*  --   --  102*  < > = values in this interval not displayed.  Recent Labs  08/07/16 1302  LABPROT 14.8  INR 1.15      Assessment/Plan: Resolving hepatic encephalopathy likely due to recent GI bleed - No further bleeding and Hgb 7.9 yesterday. Recheck H/H Monday. Continue Lactulose, Xifaxan. Will start low sodium diet this afternoon. Advised to avoid NSAIDs. May be ok for discharge home tomorrow but will defer to Dr. Angus PalmsWohl/Anna.   [name] C. 08/10/2016, 10:56 AM

## 2016-08-10 NOTE — Progress Notes (Signed)
Patient ID: Howard [name], male   DOB: October 21, 1980, 35 y.o.   MRN: [id number]  [facility] PROGRESS NOTE  Howard [name] DGU:[id number] DOB: [birth year] DOA: 08/07/2016 PCP: Marisue Ivan, MD  HPI/Subjective: Patient still lethargic and sleepy today.  He is able to follow commands but usually more alert when his ammonia level is low. Answers some questions  Objective: Vitals:   08/09/16 2030 08/10/16 0519  BP: 124/67 (!) 131/54  Pulse: 80 69  Resp: 18 16  Temp: 98.3 F (36.8 C) 97.6 F (36.4 C)    Filed Weights   08/07/16 1230  Weight: (!) 162.4 kg (358 lb)    ROS: Review of Systems  Unable to perform ROS: Acuity of condition  Respiratory: Negative for shortness of breath.   Cardiovascular: Negative for chest pain.  Gastrointestinal: Positive for abdominal pain and diarrhea.  Neurological: Positive for weakness.   Exam: Physical Exam  Constitutional: He appears lethargic.  HENT:  Nose: No mucosal edema.  Mouth/Throat: No oropharyngeal exudate or posterior oropharyngeal edema.  Eyes: Conjunctivae, EOM and lids are normal. Pupils are equal, round, and reactive to light.  Neck: No JVD present. Carotid bruit is not present. No edema present. No thyroid mass and no thyromegaly present.  Cardiovascular: S1 normal and S2 normal.  Exam reveals no gallop.   No murmur heard. Pulses:      Dorsalis pedis pulses are 2+ on the right side, and 2+ on the left side.  Respiratory: No respiratory distress. He has decreased breath sounds in the right lower field and the left lower field. He has no wheezes. He has no rhonchi. He has no rales.  GI: Soft. Bowel sounds are normal. There is no tenderness.  Musculoskeletal:       Right ankle: He exhibits swelling.       Left ankle: He exhibits swelling.  Lymphadenopathy:    He has no cervical adenopathy.  Neurological: He appears lethargic.  Skin: Skin is warm. No rash noted. Nails show no clubbing.  Psychiatric: His speech is  delayed.      Data Reviewed: Basic Metabolic Panel:  Recent Labs Lab 08/07/16 1235 08/08/16 0148 08/10/16 0505  NA 138 141 140  K 4.8 4.5 3.8  CL 101 103 98*  CO2 30 35* 37*  GLUCOSE 148* 105* 98  BUN 18 15 12   CREATININE 1.01 0.89 0.83  CALCIUM 8.5* 8.5* 8.8*   Liver Function Tests:  Recent Labs Lab 08/07/16 1235 08/08/16 0148  AST 36 37  ALT 27 28  ALKPHOS 118 101  BILITOT 0.3 0.8  PROT 7.6 7.9  ALBUMIN 3.4* 3.4*     Recent Labs Lab 08/07/16 1440 08/08/16 0148 08/09/16 0204 08/10/16 0505  AMMONIA 115* 117* 76* 49*   CBC:  Recent Labs Lab 08/07/16 1440  08/08/16 1356 08/08/16 1919 08/09/16 0204 08/09/16 0737 08/09/16 1308  WBC 7.7  --   --   --   --   --  5.5  HGB 8.7*  < > 8.5* 8.9* 8.0* 8.5* 7.9*  HCT 28.6*  --   --   --   --   --  26.4*  MCV 77.5*  --   --   --   --   --  77.9*  PLT 127*  --   --   --   --   --  102*  < > = values in this interval not displayed.  CBG:  Recent Labs Lab 08/09/16 1633 08/09/16 1652 08/09/16 2135  08/09/16 2246 08/10/16 0739  GLUCAP 67 80 71 127* 103*    Scheduled Meds: . acamprosate  666 mg Oral TID WC  . citalopram  20 mg Oral BH-q7a  . furosemide  20 mg Oral Daily  . insulin aspart  0-5 Units Subcutaneous QHS  . insulin aspart  0-9 Units Subcutaneous TID WC  . lactulose  45 g Oral Q4H  . levETIRAcetam  1,000 mg Oral BID  . multivitamin with minerals  1 tablet Oral Daily  . nadolol  20 mg Oral Daily  . OLANZapine  30 mg Oral QHS  . pantoprazole  40 mg Oral BID AC  . rifaximin  550 mg Oral BID  . sodium chloride flush  3 mL Intravenous Q12H  . sucralfate  1 g Oral QID  . thiamine  100 mg Oral Daily      Assessment/Plan:  1. Lethargy despite lower ammonia level. Get ABG to see if CO2 retension. Get ct head. 2. Hepatic encephalopathy. The patient gets into issues when he doesn't have bowel movements for a few days. Continue lactulose to 45 g every 4 hours. Continue Xifaxan. Patient will  need to have 3 bowel movements a day at least. Ammonia level down to 49 today.  3. GI bleed. Hemoglobin remains relatively stable. Stop octreotide drip. Changed Protonix to oral. Advance diet to soft diet. 4. Relative hypotension. Hold spironolactone and losartan. Holding parameters on nadolol 5. History of seizure on Keppra 6. Depression continue Celexa and olanzapine 7. Weakness. Physical therapy evaluation 8. Alcohol abuse. Put on Librium when necessary Ciwa protocol. 9. Repeated admissions for similar issues. Poor insight. Palliative care consultation requested but unable to be done until Monday.  Code Status:     Code Status Orders        Start     Ordered   08/07/16 1938  Full code  Continuous     08/07/16 1937    Code Status History    Date Active Date Inactive Code Status Order ID Comments User Context   07/07/2016  1:40 AM 07/09/2016  6:29 PM Full Code [id number]  Ihor AustinPavan Pyreddy, MD Inpatient   06/10/2016  6:21 AM 06/11/2016  8:25 PM Full Code [id number]  Ihor AustinPavan Pyreddy, MD Inpatient   05/12/2016  3:55 AM 05/15/2016  2:57 PM Full Code [id number]  Ihor AustinPavan Pyreddy, MD Inpatient   04/28/2016  2:54 AM 04/29/2016  6:23 PM Full Code [id number]  Tonye RoyaltyAlexis Hugelmeyer, DO Inpatient   03/14/2016 12:30 PM 03/16/2016  3:41 PM Full Code [id number]  Shaune PollackQing Chen, MD Inpatient   03/03/2016 12:13 PM 03/06/2016  2:26 PM Full Code [id number]  Milagros LollSrikar Sudini, MD ED   02/10/2016  5:54 PM 02/12/2016  7:34 PM Full Code [id number]  Ramonita LabAruna Gouru, MD Inpatient   11/11/2015  5:16 AM 11/13/2015  6:00 PM Full Code [id number]  Ihor AustinPavan Pyreddy, MD Inpatient   11/07/2015 10:02 PM 11/09/2015  4:56 PM Full Code [id number]  Enid Baasadhika Kalisetti, MD Inpatient   10/28/2015  8:14 PM 10/29/2015  2:26 PM Full Code [id number]  Altamese DillingVaibhavkumar Vachhani, MD Inpatient   10/16/2015 11:47 PM 10/17/2015  6:00 PM Full Code [id number]  Oralia Manisavid Willis, MD Inpatient   07/30/2015  5:35 PM 08/04/2015  8:34 PM Full Code [id number]  Enedina FinnerSona Patel, MD Inpatient   07/05/2015  1:16 AM  07/08/2015  7:22 PM Full Code [id number]  Oralia Manisavid Willis, MD Inpatient   05/30/2015  7:28 PM 06/01/2015  3:36 PM Full Code [id number]  Audery AmelJohn T [name], MD Inpatient  Disposition Plan: To be determined  Consultants:  Gastroenterology  Palliative care  Time spent: 26 minutes  Alford HighlandWIETING, Ricquel Foulk  [employer]

## 2016-08-10 NOTE — Evaluation (Signed)
Physical Therapy Evaluation Patient Details Name: Howard [name] MRN: [id number] DOB: [birth year] Today's Date: 08/10/2016   History of Present Illness  35 year old male who has a history of alcoholic cirrhosis. He was visited by home health nursing today and found to be having black tarry looking stools. He recently had some labs drawn at Dr. Earnest ConroyElliott's office and found to have high ammonia. Dr. Earnest ConroyElliott's office was contacted by home health nursing and he was told to come into the ER. Here he is mildly confused and has guaiac-positive stools. He has a history of variceal bleeding. He also continues to drink beers.  Clinical Impression  Pt admitted with above diagnosis. Pt currently with functional limitations due to the deficits listed below (see PT Problem List). Patient is modified independent with bed mobility and transfers. He is relatively stable during ambulation with use of bariatric rolling walker. Pt able to complete a full lap around RN station. SaO2 difficult to obtain but appears to be in mid to upper 80's. Pt denies DOE. He is able to transfer with good strength from commode, demonstrating safe hand placement. Patient with generalized deconditioning and weakness especially of the upper extremities. Patient with higher level balance deficits with eyes closed and in single-leg stance. He would benefit from home health physical therapy at discharge to improve function, strength, and decrease his fall risk. Patient reports a history of multiple falls in the last year. Pt will benefit from skilled PT services to address deficits in strength, balance, and mobility in order to return to full function at home.     Follow Up Recommendations Home health PT    Equipment Recommendations  None recommended by PT    Recommendations for Other Services       Precautions / Restrictions Precautions Precautions: Fall Restrictions Weight Bearing Restrictions: No      Mobility  Bed Mobility Overal  bed mobility: Modified Independent             General bed mobility comments: Heavy uses of bed rails and increased time. HOB flat and no external assistance required  Transfers Overall transfer level: Modified independent Equipment used: Rolling walker (2 wheeled)             General transfer comment: Pt demonstrates fair speed/sequencing sit to stand transfers from bed and low commode  Ambulation/Gait Ambulation/Gait assistance: Min guard Ambulation Distance (Feet): 220 Feet Assistive device: Rolling walker (2 wheeled) Gait Pattern/deviations: Decreased step length - right;Decreased step length - left Gait velocity: Decreased but functional for full household and limited community mobility   General Gait Details: Pt ambulates a full lap around RN station on 2L/min O2. Pt denies DOE. SaO2 difficult to obtain but appears to be in mid to upper 90's. Pt reports history of chronic DOE during ambulation at home. Gait speed is functional for household mobility. Good stability noted with turns. Pt intermittently picks up walker when in bathroom turning in tight spaces without LOB  Stairs            Wheelchair Mobility    Modified Rankin (Stroke Patients Only)       Balance Overall balance assessment: Needs assistance Sitting-balance support: No upper extremity supported Sitting balance-Leahy Scale: Good     Standing balance support: No upper extremity supported Standing balance-Leahy Scale: Fair Standing balance comment: Able to achieve narrow stance without UE support. Positive Rhomberg within 1 second of closing eyes. Single leg balance 1-2 seconds bilaterally  Pertinent Vitals/Pain Pain Assessment: 0-10 Pain Score: 6  Pain Location: R ankle, chronic PTA Pain Intervention(s): Monitored during session;Other (comment) (Refuses intervention)    Home Living Family/patient expects to be discharged to:: Private  residence Living Arrangements: Parent;Other relatives;Other (Comment) (Brother) Available Help at Discharge: Family;Available 24 hours/day Type of Home: House Home Access: Stairs to enter Entrance Stairs-Rails: Right;Left;Can reach both Entrance Stairs-Number of Steps: 6 Home Layout: One level Home Equipment: Walker - 2 wheels;Cane - quad;Wheelchair - power      Prior Function Level of Independence: Needs assistance   Gait / Transfers Assistance Needed: Pt uses QC most of the time in house and power wheelchair when out in the community  ADL's / Homemaking Assistance Needed: Independent with ADLs, assist from family with IADLs        Hand Dominance   Dominant Hand: Right    Extremity/Trunk Assessment   Upper Extremity Assessment: Generalized weakness (Elbow extension and shoulder flexion 4-/5)           Lower Extremity Assessment: Generalized weakness         Communication   Communication: No difficulties  Cognition Arousal/Alertness: Awake/alert Behavior During Therapy: Flat affect Overall Cognitive Status: Within Functional Limits for tasks assessed (AOx4, somewhat slow to respond,)                      General Comments      Exercises     Assessment/Plan    PT Assessment Patient needs continued PT services  PT Problem List Decreased strength;Decreased activity tolerance;Decreased balance;Decreased mobility;Decreased safety awareness;Cardiopulmonary status limiting activity;Obesity          PT Treatment Interventions DME instruction;Gait training;Stair training;Functional mobility training;Therapeutic activities;Balance training;Therapeutic exercise;Neuromuscular re-education;Patient/family education;Cognitive remediation    PT Goals (Current goals can be found in the Care Plan section)  Acute Rehab PT Goals Patient Stated Goal: Return to prior level of function at home PT Goal Formulation: With patient Time For Goal Achievement:  08/24/16 Potential to Achieve Goals: Fair    Frequency Min 2X/week   Barriers to discharge        Co-evaluation               End of Session Equipment Utilized During Treatment: Gait belt;Oxygen;Other (comment) (2 L/min) Activity Tolerance: Patient tolerated treatment well Patient left: in bed;with call bell/phone within reach;with bed alarm set           Time: [id number] PT Time Calculation (min) (ACUTE ONLY): 20 min   Charges:   PT Evaluation $PT Eval Moderate Complexity: 1 Procedure     PT G Codes:       Sharalyn InkJason D Huprich PT, DPT   Huprich,Jason 08/10/2016, 10:17 AM

## 2016-08-10 NOTE — Plan of Care (Signed)
Problem: Nutrition: Goal: Adequate nutrition will be maintained Outcome: Progressing Diet advanced

## 2016-08-10 NOTE — Progress Notes (Signed)
Patient ID: Howard LinemanJoey A Urbanik, male   DOB: Jan 19, 1981, 35 y.o.   MRN: [id number]  Pco2 high, could be reason for altered mental status and lethargy. Start Bipap at night.

## 2016-08-10 NOTE — Plan of Care (Signed)
Problem: Activity: Goal: Risk for activity intolerance will decrease Outcome: Progressing Patient has weakness but ambulates to the bathroom with minimal assistance.

## 2016-08-11 DIAGNOSIS — Z7189 Other specified counseling: Secondary | ICD-10-CM

## 2016-08-11 DIAGNOSIS — Z515 Encounter for palliative care: Secondary | ICD-10-CM

## 2016-08-11 DIAGNOSIS — M6281 Muscle weakness (generalized): Secondary | ICD-10-CM

## 2016-08-11 DIAGNOSIS — F1010 Alcohol abuse, uncomplicated: Secondary | ICD-10-CM

## 2016-08-11 LAB — BASIC METABOLIC PANEL
Anion gap: 5 (ref 5–15)
BUN: 13 mg/dL (ref 6–20)
CHLORIDE: 97 mmol/L — AB (ref 101–111)
CO2: 38 mmol/L — AB (ref 22–32)
Calcium: 9 mg/dL (ref 8.9–10.3)
Creatinine, Ser: 0.87 mg/dL (ref 0.61–1.24)
GFR calc non Af Amer: >60 mL/min (ref >60)
Glucose, Bld: 122 mg/dL — ABNORMAL HIGH (ref 65–99)
POTASSIUM: 3.8 mmol/L (ref 3.5–5.1)
SODIUM: 140 mmol/L (ref 135–145)

## 2016-08-11 LAB — AMMONIA: AMMONIA: 68 umol/L — AB (ref 9–35)

## 2016-08-11 LAB — CBC
HEMATOCRIT: 28.1 % — AB (ref 40.0–52.0)
Hemoglobin: 8.5 g/dL — ABNORMAL LOW (ref 13.0–18.0)
MCH: 23.4 pg — AB (ref 26.0–34.0)
MCHC: 30.3 g/dL — ABNORMAL LOW (ref 32.0–36.0)
MCV: 77.3 fL — AB (ref 80.0–100.0)
Platelets: 87 10*3/uL — ABNORMAL LOW (ref 150–440)
RBC: 3.63 MIL/uL — AB (ref 4.40–5.90)
RDW: 18.9 % — ABNORMAL HIGH (ref 11.5–14.5)
WBC: 5.4 10*3/uL (ref 3.8–10.6)

## 2016-08-11 LAB — GLUCOSE, CAPILLARY
GLUCOSE-CAPILLARY: 122 mg/dL — AB (ref 65–99)
GLUCOSE-CAPILLARY: 122 mg/dL — AB (ref 65–99)
Glucose-Capillary: 110 mg/dL — ABNORMAL HIGH (ref 65–99)
Glucose-Capillary: 154 mg/dL — ABNORMAL HIGH (ref 65–99)

## 2016-08-11 LAB — TYPE AND SCREEN
ABO/RH(D): O POS
Antibody Screen: NEGATIVE

## 2016-08-11 MED ORDER — MODAFINIL 100 MG PO TABS
100.0000 mg | ORAL_TABLET | Freq: Every day | ORAL | Status: DC
  Administered 2016-08-11 – 2016-08-12 (×2): 100 mg via ORAL
  Filled 2016-08-11 (×2): qty 1

## 2016-08-11 NOTE — Progress Notes (Addendum)
Patient ID: Howard [name], male   DOB: 04/23/1981, 35 y.o.   MRN: [id number]  [facility] PROGRESS NOTE  Howard [name] QMV:[id number] DOB: 1981-07-05 DOA: 08/07/2016 PCP: Marisue Ivan, MD  HPI/Subjective: Patient is little sleepy according to the nurse she was able to eat his breakfast this morning. Without any difficulties  Objective: Vitals:   08/11/16 1339 08/11/16 1403  BP: (!) 84/39 104/66  Pulse: 87 91  Resp: 20   Temp: 97.9 F (36.6 C)     Filed Weights   08/07/16 1230  Weight: (!) 358 lb (162.4 kg)    ROS: Review of Systems  Unable to perform ROS: Acuity of condition  Respiratory: Negative for shortness of breath.   Cardiovascular: Negative for chest pain.  Gastrointestinal: Positive for abdominal pain and diarrhea.  Neurological: Positive for weakness.   Exam: Physical Exam  Constitutional: He appears lethargic.  HENT:  Nose: No mucosal edema.  Mouth/Throat: No oropharyngeal exudate or posterior oropharyngeal edema.  Eyes: Conjunctivae, EOM and lids are normal. Pupils are equal, round, and reactive to light.  Neck: No JVD present. Carotid bruit is not present. No edema present. No thyroid mass and no thyromegaly present.  Cardiovascular: S1 normal and S2 normal.  Exam reveals no gallop.   No murmur heard. Pulses:      Dorsalis pedis pulses are 2+ on the right side, and 2+ on the left side.  Respiratory: No respiratory distress. He has decreased breath sounds in the right lower field and the left lower field. He has no wheezes. He has no rhonchi. He has no rales.  GI: Soft. Bowel sounds are normal. There is no tenderness.  Musculoskeletal:       Right ankle: He exhibits swelling.       Left ankle: He exhibits swelling.  Lymphadenopathy:    He has no cervical adenopathy.  Neurological: He appears lethargic.  Skin: Skin is warm. No rash noted. Nails show no clubbing.  Psychiatric: His speech is delayed.      Data Reviewed: Basic Metabolic  Panel:  Recent Labs Lab 08/07/16 1235 08/08/16 0148 08/10/16 0505 08/11/16 0446  NA 138 141 140 140  K 4.8 4.5 3.8 3.8  CL 101 103 98* 97*  CO2 30 35* 37* 38*  GLUCOSE 148* 105* 98 122*  BUN 18 15 12 13   CREATININE 1.01 0.89 0.83 0.87  CALCIUM 8.5* 8.5* 8.8* 9.0   Liver Function Tests:  Recent Labs Lab 08/07/16 1235 08/08/16 0148  AST 36 37  ALT 27 28  ALKPHOS 118 101  BILITOT 0.3 0.8  PROT 7.6 7.9  ALBUMIN 3.4* 3.4*     Recent Labs Lab 08/07/16 1440 08/08/16 0148 08/09/16 0204 08/10/16 0505 08/11/16 0446  AMMONIA 115* 117* 76* 49* 68*   CBC:  Recent Labs Lab 08/07/16 1440  08/08/16 1919 08/09/16 0204 08/09/16 0737 08/09/16 1308 08/11/16 0446  WBC 7.7  --   --   --   --  5.5 5.4  HGB 8.7*  < > 8.9* 8.0* 8.5* 7.9* 8.5*  HCT 28.6*  --   --   --   --  26.4* 28.1*  MCV 77.5*  --   --   --   --  77.9* 77.3*  PLT 127*  --   --   --   --  102* 87*  < > = values in this interval not displayed.  CBG:  Recent Labs Lab 08/10/16 1144 08/10/16 1650 08/10/16 2137 08/11/16 0813 08/11/16 1207  GLUCAP 155* 84 100* 122* 122*    Scheduled Meds: . acamprosate  666 mg Oral TID WC  . citalopram  20 mg Oral BH-q7a  . furosemide  20 mg Oral Daily  . insulin aspart  0-5 Units Subcutaneous QHS  . insulin aspart  0-9 Units Subcutaneous TID WC  . lactulose  45 g Oral Q4H  . levETIRAcetam  1,000 mg Oral BID  . modafinil  100 mg Oral Daily  . multivitamin with minerals  1 tablet Oral Daily  . nadolol  20 mg Oral Daily  . OLANZapine  30 mg Oral QHS  . pantoprazole  40 mg Oral BID AC  . rifaximin  550 mg Oral BID  . sodium chloride flush  3 mL Intravenous Q12H  . sucralfate  1 g Oral QID  . thiamine  100 mg Oral Daily      Assessment/Plan:  1. Lethargy despite lower ammonia level. ABG chest shows hypercarbia continue BiPAP at nighttime 2. Chronic respiratory failure due to COPD with chronic oxygen use at all times 3. Hepatic encephalopathy.  Continue  lactulose to 45 g every 4 hours. Continue Xifaxan. Patient will need to have 3 bowel movements a day at least. Ammonia level down to 49 today.  4. GI bleed. Hemoglobin stable.  Changed Protonix to oral. Advance diet to soft diet. 5. Relative hypotension. Hold spironolactone and losartan. Holding parameters on nadolol 6. History of seizure on Keppra 7. Depression continue Celexa and olanzapine 8. Weakness. Physical therapy evaluation 9. Alcohol abuse. Discontinue Librium 10. Repeated admissions for similar issues. [facility].  Code Status:     Code Status Orders        Start     Ordered   08/07/16 1938  Full code  Continuous     08/07/16 1937    Code Status History    Date Active Date Inactive Code Status Order ID Comments User Context   07/07/2016  1:40 AM 07/09/2016  6:29 PM Full Code [id number]  Ihor AustinPavan Pyreddy, MD Inpatient   06/10/2016  6:21 AM 06/11/2016  8:25 PM Full Code [id number]  Ihor AustinPavan Pyreddy, MD Inpatient   05/12/2016  3:55 AM 05/15/2016  2:57 PM Full Code [id number]  Ihor AustinPavan Pyreddy, MD Inpatient   04/28/2016  2:54 AM 04/29/2016  6:23 PM Full Code [id number]  Tonye RoyaltyAlexis Hugelmeyer, DO Inpatient   03/14/2016 12:30 PM 03/16/2016  3:41 PM Full Code [id number]  Shaune PollackQing Chen, MD Inpatient   03/03/2016 12:13 PM 03/06/2016  2:26 PM Full Code [id number]  Milagros LollSrikar Sudini, MD ED   02/10/2016  5:54 PM 02/12/2016  7:34 PM Full Code [id number]  Ramonita LabAruna Gouru, MD Inpatient   11/11/2015  5:16 AM 11/13/2015  6:00 PM Full Code [id number]  Ihor AustinPavan Pyreddy, MD Inpatient   11/07/2015 10:02 PM 11/09/2015  4:56 PM Full Code [id number]  Enid Baasadhika Kalisetti, MD Inpatient   10/28/2015  8:14 PM 10/29/2015  2:26 PM Full Code [id number]  Altamese DillingVaibhavkumar Vachhani, MD Inpatient   10/16/2015 11:47 PM 10/17/2015  6:00 PM Full Code [id number]  Oralia Manisavid Willis, MD Inpatient   07/30/2015  5:35 PM 08/04/2015  8:34 PM Full Code [id number]  Enedina FinnerSona Milad Bublitz, MD Inpatient   07/05/2015  1:16 AM 07/08/2015  7:22 PM Full Code [id number]  Oralia Manisavid Willis, MD  Inpatient   05/30/2015  7:28 PM 06/01/2015  3:36 PM Full Code [id number]  Audery AmelJohn T [name], MD Inpatient     Disposition Plan: To be determined  Consultants:  Gastroenterology  Palliative care  Time spent: 26 minutes  Reem Fleury, [facility]  [facility]

## 2016-08-11 NOTE — Progress Notes (Signed)
Pt in bed sleeipng heavily. Mother in room noted Pt was sleeping extensively. Mother commented that Pt has heavy beer drinking habit that had affected his liver. CH is available.   08/11/16 1115  Clinical Encounter Type  Visited With Patient and family together  Visit Type Initial  Referral From Nurse  Spiritual Encounters  Spiritual Needs Emotional  Stress Factors  Patient Stress Factors None identified  Family Stress Factors Exhausted

## 2016-08-11 NOTE — Consult Note (Signed)
Consultation Note Date: 08/11/2016   Patient Name: Howard [name]  DOB: 1981/07/01  MRN: [id number]  Age / Sex: 35 y.o., male  PCP: Marisue IvanKanhka [name], MD Referring Physician: Auburn BilberryShreyang Patel, MD  Reason for Consultation: Establishing goals of care and Psychosocial/spiritual support  HPI/Patient Profile: 35 y.o. male  admitted on 08/07/2016 with  blood in stool and high ammonia level.  He was visited by home health nursing  and found to be having black tarry looking stools.  Patient  has a history of alcoholic cirrhosis. He lives at home with his mother and brother. He reports h/o schizophrenia   He has had  multiple rehosptializations  He has a history of variceal bleeding. He also continues to drink beers 6-12 /day by patient report  He faces advanced directive decisions and life within the context of addiction and serious life limiting illness  Clinical Assessment and Goals of Care:  This NP Lorinda CreedMary Larach reviewed medical records, received report from team, assessed the patient and then meet at the patient's bedside along with his mother to discuss diagnosis, prognosis, GOC,  disposition and options.  A  discussion was had today regarding advanced directives.  Concepts specific to code status,  and rehospitalization was had.  The difference between a aggressive medical intervention path  and a palliative comfort care path for this patient at this time was had.  Values and goals of care important to patient and family were attempted to be elicited.  Concept of Palliative Care was discussed  Questions and concerns addressed.   Family encouraged to call with questions or concerns.  PMT will continue to support holistically.   No AD or HPOA is documented, patient  encouraged to consider,  and I offered assistance with documentation while hospitalized     SUMMARY OF RECOMMENDATIONS    Code Status/Advance  Care Planning:  Full code   Palliative Prophylaxis:   Aspiration, Bowel Regimen, Delirium Protocol, Frequent Pain Assessment and Oral Care    Psycho-social/Spiritual:   Desire for further Chaplaincy support:yes  Patient tells me today he believes he "was saved" and that this time he will be able to stop drinking with the "help of God"   Prognosis:   Unable to determine  Dischge Planning:   Consider SNF for rehabilitation if eligible vs   Home with Home Health     Primary Diagnoses: Present on Admission: . GI bleed   I have reviewed the medical record, interviewed the patient and family, and examined the patient. The following aspects are pertinent.  Past Medical History:  Diagnosis Date  . Alcoholic cirrhosis of liver with ascites (HCC)   . Anemia   . COPD (chronic obstructive pulmonary disease) (HCC)   . Depression   . Diabetes (HCC)   . Diabetes mellitus, type II (HCC)   . Esophageal varices (HCC)   . Heart disease   . Hyperlipemia   . Hypertension   . Liver disease   . Multiple thyroid nodules   . Portal hypertensive gastropathy   .  Schizophrenia [facility](HCC)    Social History   Social History  . Marital status: Single    Spouse name: N/A  . Number of children: N/A  . Years of education: N/A   Occupational History  . disabled    Social History Main Topics  . Smoking status: Never Smoker  . Smokeless tobacco: Never Used  . Alcohol use 30.0 oz/week    50 Cans of beer per week     Comment: 5 beers yesterday  . Drug use: No  . Sexual activity: No   Other Topics Concern  . None   Social History Narrative   ** Merged History Encounter **       Family History  Problem Relation Age of Onset  . Heart disease Mother   . Hypertension Mother   . Hyperlipidemia Mother   . Stroke Father   . Heart attack Father   . Hypertension Father   . Heart disease Father   . Alcohol abuse Father   . Heart disease Brother    Scheduled Meds: . acamprosate  666  mg Oral TID WC  . citalopram  20 mg Oral BH-q7a  . furosemide  20 mg Oral Daily  . insulin aspart  0-5 Units Subcutaneous QHS  . insulin aspart  0-9 Units Subcutaneous TID WC  . lactulose  45 g Oral Q4H  . levETIRAcetam  1,000 mg Oral BID  . modafinil  100 mg Oral Daily  . multivitamin with minerals  1 tablet Oral Daily  . nadolol  20 mg Oral Daily  . OLANZapine  30 mg Oral QHS  . pantoprazole  40 mg Oral BID AC  . rifaximin  550 mg Oral BID  . sodium chloride flush  3 mL Intravenous Q12H  . sucralfate  1 g Oral QID  . thiamine  100 mg Oral Daily   Continuous Infusions: PRN Meds:.sodium chloride, chlordiazePOXIDE, sodium chloride flush Medications Prior to Admission:  Prior to Admission medications   Medication Sig Start Date End Date Taking? Authorizing Provider  acamprosate (CAMPRAL) 333 MG tablet Take 2 tablets (666 mg total) by mouth 3 (three) times daily with meals. 06/19/16  Yes Audery AmelJohn T [name], MD  citalopram (CELEXA) 20 MG tablet Take 20 mg by mouth every morning. 04/08/16  Yes Historical Provider, MD  furosemide (LASIX) 20 MG tablet Take 1 tablet (20 mg total) by mouth daily. 05/15/16  Yes Richard Renae GlossWieting, MD  lactulose (CHRONULAC) 10 GM/15ML solution Take 45 mLs (30 g total) by mouth every 4 (four) hours. 07/09/16  Yes Katharina Caperima Vaickute, MD  levETIRAcetam (KEPPRA) 1000 MG tablet Take 1 tablet (1,000 mg total) by mouth 2 (two) times daily. 07/09/16  Yes Katharina Caperima Vaickute, MD  losartan (COZAAR) 25 MG tablet Take 25 mg by mouth daily.   Yes Historical Provider, MD  metFORMIN (GLUCOPHAGE) 500 MG tablet Take 1 tablet (500 mg total) by mouth 2 (two) times daily with a meal. 05/15/16  Yes Alford Highlandichard Wieting, MD  Multiple Vitamin (MULTIVITAMIN WITH MINERALS) TABS tablet Take 1 tablet by mouth daily. 08/04/15  Yes Ramonita LabAruna Gouru, MD  nadolol (CORGARD) 20 MG tablet Take 20 mg by mouth daily.   Yes Historical Provider, MD  OLANZapine (ZYPREXA) 15 MG tablet Take 2 tablets (30 mg total) by mouth at bedtime.  05/22/16  Yes Audery AmelJohn T [name], MD  omeprazole (PRILOSEC) 40 MG capsule Take 1 capsule (40 mg total) by mouth every morning. 02/12/16  Yes Enid Baasadhika Kalisetti, MD  rifaximin (XIFAXAN) 550 MG TABS tablet  Take 1 tablet (550 mg total) by mouth 2 (two) times daily. 07/09/16  Yes Katharina Caper, MD  spironolactone (ALDACTONE) 50 MG tablet Take 1 tablet (50 mg total) by mouth 2 (two) times daily. 07/09/16  Yes Katharina Caper, MD  sucralfate (CARAFATE) 1 g tablet Take 1 g by mouth 4 (four) times daily.   Yes Historical Provider, MD   No Known Allergies Review of Systems  Constitutional: Positive for appetite change and fatigue.  Gastrointestinal: Positive for abdominal distention.  Neurological: Positive for weakness.    Physical Exam  Constitutional: He is oriented to person, place, and time. He appears well-developed. He appears ill. Nasal cannula in place.  HENT:  Mouth/Throat: Mucous membranes are dry.  Cardiovascular: Normal rate, regular rhythm and normal heart sounds.   Pulmonary/Chest: He has decreased breath sounds in the right lower field and the left lower field.  Neurological: He is alert and oriented to person, place, and time.  Skin: Skin is warm and dry.    Vital Signs: BP 128/67   Pulse 91   Temp 97.4 F (36.3 C) (Oral)   Resp 16   Ht 6\' 3"  (1.905 m)   Wt (!) 162.4 kg (358 lb)   SpO2 94%   BMI 44.75 kg/m  Pain Assessment: No/denies pain   Pain Score: Asleep   SpO2: SpO2: 94 % O2 Device:SpO2: 94 % O2 Flow Rate: .O2 Flow Rate (L/min): 2 L/min  IO: Intake/output summary:  Intake/Output Summary (Last 24 hours) at 08/11/16 1308 Last data filed at 08/11/16 1241  Gross per 24 hour  Intake              840 ml  Output              400 ml  Net              440 ml    LBM: Last BM Date: 08/11/16 Baseline Weight: Weight: (!) 162.4 kg (358 lb) Most recent weight: Weight: (!) 162.4 kg (358 lb)      Palliative Assessment/Data: 40% at best   Flowsheet Rows   Flowsheet Row  Most Recent Value  Intake Tab  Referral Department  Hospitalist  Unit at Time of Referral  Med/Surg Unit  Palliative Care Primary Diagnosis  Other (Comment) [cirrhosis]  Date Notified  08/08/16  Palliative Care Type  New Palliative care  Reason for referral  Clarify Goals of Care  Date of Admission  08/07/16  # of days IP prior to Palliative referral  1  Clinical Assessment  Psychosocial & Spiritual Assessment  Palliative Care Outcomes      Time In: 1245 Time Out: 1400 Time Total: 75 min Greater than 50%  of this time was spent counseling and coordinating care related to the above assessment and plan.  Signed by: Lorinda Creed, NP   Please contact Palliative Medicine Team phone at [phone] for questions and concerns.  For individual provider: See Loretha Stapler

## 2016-08-12 LAB — AMMONIA: AMMONIA: 63 umol/L — AB (ref 9–35)

## 2016-08-12 LAB — BASIC METABOLIC PANEL
Anion gap: 5 (ref 5–15)
BUN: 14 mg/dL (ref 6–20)
CHLORIDE: 98 mmol/L — AB (ref 101–111)
CO2: 37 mmol/L — ABNORMAL HIGH (ref 22–32)
CREATININE: 0.78 mg/dL (ref 0.61–1.24)
Calcium: 8.9 mg/dL (ref 8.9–10.3)
Glucose, Bld: 111 mg/dL — ABNORMAL HIGH (ref 65–99)
Potassium: 4 mmol/L (ref 3.5–5.1)
SODIUM: 140 mmol/L (ref 135–145)

## 2016-08-12 LAB — GLUCOSE, CAPILLARY
GLUCOSE-CAPILLARY: 91 mg/dL (ref 65–99)
Glucose-Capillary: 181 mg/dL — ABNORMAL HIGH (ref 65–99)

## 2016-08-12 MED ORDER — MODAFINIL 100 MG PO TABS: 100.0000 mg | ORAL_TABLET | Freq: Every day | ORAL | 0 refills | Status: DC

## 2016-08-12 NOTE — Care Management (Signed)
Results for Howard [name], Howard [name] (MRN [id number]) as of 08/12/2016 14:05  Ref. Range 08/10/2016 09:24  Sample type Unknown ARTERIAL DRAW  Delivery systems Unknown ROOM AIR  FIO2 Unknown 0.21  pH, Arterial Latest Ref Range: 7.350 - 7.450  7.37  pCO2 arterial Latest Ref Range: 32.0 - 48.0 mmHg 68 (HH)  pO2, Arterial Latest Ref Range: 83.0 - 108.0 mmHg 61 (L)  Acid-Base Excess Latest Ref Range: 0.0 - 2.0 mmol/L 12.2 (H)  Bicarbonate Latest Ref Range: 20.0 - 28.0 mmol/L 39.3 (H)  O2 Saturation Latest Units: % 90.2  Patient temperature Unknown 37.0  Collection site Unknown LEFT RADIAL  Allens test (pass/fail) Latest Ref Range: PASS  PASS

## 2016-08-12 NOTE — Care Management Important Message (Signed)
Important Message  Patient Details  Name: Howard LinemanJoey A Jost MRN: [id number] Date of Birth: November 12, 1980   Medicare Important Message Given:  Yes    Chapman FitchBOWEN, Vineeth Fell T, RN 08/12/2016, 3:36 PM

## 2016-08-12 NOTE — Discharge Instructions (Addendum)
[facility] - [place] [facility]  DIET:  Low fat, Low cholesterol diet  DISCHARGE CONDITION:  Stable  ACTIVITY:  Activity as tolerated  OXYGEN:  Home Oxygen: Yes.     Oxygen Delivery: 2 liters/min via Patient connected to nasal cannula oxygen  DISCHARGE LOCATION:  home    ADDITIONAL DISCHARGE INSTRUCTION: orderning noctutral And daytime volume ventilation BiPAP insufficient due to severity of condition.    If you experience worsening of your admission symptoms, develop shortness of breath, life threatening emergency, suicidal or homicidal thoughts you must seek medical attention immediately by calling 911 or calling your MD immediately  if symptoms less severe.  You Must read complete instructions/literature along with all the possible adverse reactions/side effects for all the Medicines you take and that have been prescribed to you. Take any new Medicines after you have completely understood and accpet all the possible adverse reactions/side effects.   Please note  You were cared for by a hospitalist during your hospital stay. If you have any questions about your discharge medications or the care you received while you were in the hospital after you are discharged, you can call the unit and asked to speak with the hospitalist on call if the hospitalist that took care of you is not available. Once you are discharged, your primary care physician will handle any further medical issues. Please note that NO REFILLS for any discharge medications will be authorized once you are discharged, as it is imperative that you return to your primary care physician (or establish a relationship with a primary care physician if you do not have one) for your aftercare needs so that they can reassess your need for medications and monitor your lab values.

## 2016-08-12 NOTE — Progress Notes (Signed)
Alert and oriented. Vital signs stable . No signs of acute distress. Discharge instructions given. Patient verbalized understanding. No other issues noted at this time.    

## 2016-08-12 NOTE — Care Management (Signed)
Patient to discharge home today. Patient has portable O2 Tank at bedside.  Resumption of home health orders have been placed by MD.  Darl PikesSusan with [facility] notified of discharge plan.  Patient has been ordered for [facility] and daytime volume ventilation ordered.  Per and mother state that they do not have a preference for DME agency.  [facility].  Equipment to be delivered to home tonight. RNCM signing off

## 2016-08-12 NOTE — Discharge Summary (Signed)
[facility] [place] [facility], Morley y.o., DOB October 15, 1980, MRN [id number]. Admission date: 08/07/2016 Discharge Date 08/12/2016 Primary MD Marisue Ivan, MD Admitting Physician Gracelyn Nurse, MD  Admission Diagnosis  Melena [K92.1] Hyperammonemia (HCC) [E72.20] Acute upper GI bleed [K92.2]  Discharge Diagnosis   Active Problems: Acute encephalopathy due to hepatic encephalopathy Chronic respiratory failure due to COPD requiring oxygen all the time   Alcohol abuse   Alcoholic cirrhosis of liver with ascites (HCC)   Acute upper GI bleed seen by GI did not recommend endoscopy subtype of bleeding unclear   Palliative care by specialist   Muscle weakness (generalized)  COPD without exacerbation  Diabetes type II         Hospital Course  Is a 35 year old male who has a history of alcoholic cirrhosis. He was visited by home health nursing today and found to be having black tarry looking stools. He recently had some labs drawn at Dr. Earnest Conroy office and found to have high ammonia. Dr. Earnest Conroy office was contacted by home health nursing and he was told to come into the ER. Patient's hemoglobin remained stable there was no further evidence of bleeding therefore GI did not want to do any endoscopy. Patient continued to remain sleepy therefore his ABG was checked which showed CO2 retention. Patient was started on BiPAP with improvement in his symptoms. He was also started on Provigil with improvement in his mental status.    Patient will have nephrology machine set up for home orderning noctutral And daytime volume ventilation BiPAP insufficient due to severity of condition        Consults  GI  Significant Tests:  See full reports for all details    Ct Head Wo Contrast  Result Date: 08/10/2016 CLINICAL DATA:  Altered mental status. Hepatic encephalopathy. Lethargy and history of seizures. EXAM: CT HEAD WITHOUT CONTRAST TECHNIQUE:  Contiguous axial images were obtained from the base of the skull through the vertex without intravenous contrast. COMPARISON:  06/11/2016 brain MRI.  Head CT 06/10/2016 FINDINGS: Brain: Normal. No evidence of acute infarction, hemorrhage, hydrocephalus, extra-axial collection or mass lesion/mass effect. Vascular: No hyperdense vessel or unexpected calcification. Skull: Stable dural ossification.  No acute or aggressive finding. Sinuses/Orbits: Negative IMPRESSION: Stable and negative head CT. Electronically Signed   By: Marnee Spring M.D.   On: 08/10/2016 10:34       Today   Subjective:   Howard [name]   Feeling more awake and alert doing better  Blood pressure 122/68, pulse 90, temperature 98.7 F (37.1 C), temperature source Oral, resp. rate 20, height 6\' 3"  (1.905 m), weight (!) 358 lb (162.4 kg), SpO2 96 %.  .  Intake/Output Summary (Last 24 hours) at 08/12/16 1505 Last data filed at 08/12/16 1430  Gross per 24 hour  Intake              940 ml  Output              500 ml  Net              440 ml    Exam VITAL SIGNS: Blood pressure 122/68, pulse 90, temperature 98.7 F (37.1 C), temperature source Oral, resp. rate 20, height 6\' 3"  (1.905 m), weight (!) 358 lb (162.4 kg), SpO2 96 %.  GENERAL:  35 y.o.-year-old patient lying in the bed with no acute distress.  EYES: Pupils equal, round, reactive to light and accommodation. No scleral icterus. Extraocular muscles  intact.  HEENT: Head atraumatic, normocephalic. Oropharynx and nasopharynx clear.  NECK:  Supple, no jugular venous distention. No thyroid enlargement, no tenderness.  LUNGS: Normal breath sounds bilaterally, no wheezing, rales,rhonchi or crepitation. No use of accessory muscles of respiration.  CARDIOVASCULAR: S1, S2 normal. No murmurs, rubs, or gallops.  ABDOMEN: Soft, nontender, nondistended. Bowel sounds present. No organomegaly or mass.  EXTREMITIES: No pedal edema, cyanosis, or clubbing.  NEUROLOGIC: Cranial nerves II  through XII are intact. Muscle strength 5/5 in all extremities. Sensation intact. Gait not checked.  PSYCHIATRIC: The patient is alert and oriented x 3.  SKIN: No obvious rash, lesion, or ulcer.   Data Review     CBC w Diff: Lab Results  Component Value Date   WBC 5.4 08/11/2016   HGB 8.5 (L) 08/11/2016   HGB 14.4 12/25/2014   HCT 28.1 (L) 08/11/2016   HCT 44.7 12/25/2014   PLT 87 (L) 08/11/2016   PLT 155 12/25/2014   LYMPHOPCT 23 07/06/2016   MONOPCT 13 07/06/2016   EOSPCT 3 07/06/2016   BASOPCT 1 07/06/2016   CMP: Lab Results  Component Value Date   NA 140 08/12/2016   NA 141 12/25/2014   K 4.0 08/12/2016   K 3.4 (L) 12/25/2014   CL 98 (L) 08/12/2016   CL 100 (L) 12/25/2014   CO2 37 (H) 08/12/2016   CO2 30 12/25/2014   BUN 14 08/12/2016   BUN 15 12/25/2014   CREATININE 0.78 08/12/2016   CREATININE 1.23 12/25/2014   PROT 7.9 08/08/2016   PROT 8.5 (H) 12/25/2014   ALBUMIN 3.4 (L) 08/08/2016   ALBUMIN 4.1 12/25/2014   BILITOT 0.8 08/08/2016   BILITOT 0.9 12/25/2014   ALKPHOS 101 08/08/2016   ALKPHOS 148 (H) 12/25/2014   AST 37 08/08/2016   AST 146 (H) 12/25/2014   ALT 28 08/08/2016   ALT 107 (H) 12/25/2014  .  Micro Results No results found for this or any previous visit (from the past 240 hour(s)).      Code Status Orders        Start     Ordered   08/07/16 1938  Full code  Continuous     08/07/16 1937    Code Status History    Date Active Date Inactive Code Status Order ID Comments User Context   07/07/2016  1:40 AM 07/09/2016  6:29 PM Full Code [id number]  Ihor AustinPavan Pyreddy, MD Inpatient   06/10/2016  6:21 AM 06/11/2016  8:25 PM Full Code [id number]  Ihor AustinPavan Pyreddy, MD Inpatient   05/12/2016  3:55 AM 05/15/2016  2:57 PM Full Code [id number]  Ihor AustinPavan Pyreddy, MD Inpatient   04/28/2016  2:54 AM 04/29/2016  6:23 PM Full Code [id number]  Tonye RoyaltyAlexis Hugelmeyer, DO Inpatient   03/14/2016 12:30 PM 03/16/2016  3:41 PM Full Code [id number]  Shaune PollackQing Chen, MD Inpatient   03/03/2016  12:13 PM 03/06/2016  2:26 PM Full Code [id number]  Milagros LollSrikar Sudini, MD ED   02/10/2016  5:54 PM 02/12/2016  7:34 PM Full Code [id number]  Ramonita LabAruna Gouru, MD Inpatient   11/11/2015  5:16 AM 11/13/2015  6:00 PM Full Code [id number]  Ihor AustinPavan Pyreddy, MD Inpatient   11/07/2015 10:02 PM 11/09/2015  4:56 PM Full Code [id number]  Enid Baasadhika Kalisetti, MD Inpatient   10/28/2015  8:14 PM 10/29/2015  2:26 PM Full Code [id number]  Altamese DillingVaibhavkumar Vachhani, MD Inpatient   10/16/2015 11:47 PM 10/17/2015  6:00 PM Full Code [id number]  Oralia Manisavid Willis, MD Inpatient   07/30/2015  5:35 PM 08/04/2015  8:34 PM Full Code [id number]  Enedina FinnerSona Lenetta Piche, MD Inpatient   07/05/2015  1:16 AM 07/08/2015  7:22 PM Full Code [id number]  Oralia Manisavid Willis, MD Inpatient   05/30/2015  7:28 PM 06/01/2015  3:36 PM Full Code [id number]  Audery AmelJohn T [name], MD Inpatient          Follow-up Information    [name] FullingKurian Kasa, MD In 1 week.   Specialties:  Pulmonary Disease, Cardiology Why:  eval for sleep apnea Contact information: [address] [place] [address] [phone]        Marisue IvanLINTHAVONG, [name], MD. Go on 08/13/2016.   Specialty:  Family Medicine Why:  @3 :30p Contact information: [address] [facility] [place] [address] [phone]           Discharge Medications     Medication List    TAKE these medications   acamprosate 333 MG tablet Commonly known as:  CAMPRAL Take 2 tablets (666 mg total) by mouth 3 (three) times daily with meals.   citalopram 20 MG tablet Commonly known as:  CELEXA Take 20 mg by mouth every morning.   furosemide 20 MG tablet Commonly known as:  LASIX Take 1 tablet (20 mg total) by mouth daily.   lactulose 10 GM/15ML solution Commonly known as:  CHRONULAC Take 45 mLs (30 g total) by mouth every 4 (four) hours.   levETIRAcetam 1000 MG tablet Commonly known as:  KEPPRA Take 1 tablet (1,000 mg total) by mouth 2 (two) times daily.   losartan 25 MG tablet Commonly known as:  COZAAR Take  25 mg by mouth daily.   metFORMIN 500 MG tablet Commonly known as:  GLUCOPHAGE Take 1 tablet (500 mg total) by mouth 2 (two) times daily with a meal.   modafinil 100 MG tablet Commonly known as:  PROVIGIL Take 1 tablet (100 mg total) by mouth daily. Start taking on:  08/13/2016   multivitamin with minerals Tabs tablet Take 1 tablet by mouth daily.   nadolol 20 MG tablet Commonly known as:  CORGARD Take 20 mg by mouth daily.   OLANZapine 15 MG tablet Commonly known as:  ZYPREXA Take 2 tablets (30 mg total) by mouth at bedtime.   omeprazole 40 MG capsule Commonly known as:  PRILOSEC Take 1 capsule (40 mg total) by mouth every morning.   rifaximin 550 MG Tabs tablet Commonly known as:  XIFAXAN Take 1 tablet (550 mg total) by mouth 2 (two) times daily.   spironolactone 50 MG tablet Commonly known as:  ALDACTONE Take 1 tablet (50 mg total) by mouth 2 (two) times daily.   sucralfate 1 g tablet Commonly known as:  CARAFATE Take 1 g by mouth 4 (four) times daily.          Total Time in preparing paper work, data evaluation and todays exam - 35 minutes  Auburn BilberryPATEL, Kanita Delage M.D on 08/12/2016 at 3:05 [facility]  [facility]   Office  [phone]

## 2016-08-13 ENCOUNTER — Telehealth: Payer: Self-pay | Admitting: Internal Medicine

## 2016-08-13 NOTE — Telephone Encounter (Signed)
Just FYI-[facility] at [facility] called and stated that [facility] has attempted to contact patient multiple times and pt has not returned any of the phone calls to schedule in lab study. [facility] has mailed patient a letter to contact them to arrange study.  Rhonda J Cobb

## 2016-08-19 ENCOUNTER — Encounter: Payer: Self-pay | Admitting: Psychiatry

## 2016-08-19 ENCOUNTER — Ambulatory Visit (HOSPITAL_BASED_OUTPATIENT_CLINIC_OR_DEPARTMENT_OTHER): Payer: Medicare Other | Admitting: Psychiatry

## 2016-08-19 VITALS — BP 119/72 | HR 89 | Temp 97.2°F | Resp 16 | Wt 362.4 lb

## 2016-08-19 DIAGNOSIS — F1010 Alcohol abuse, uncomplicated: Secondary | ICD-10-CM

## 2016-08-19 DIAGNOSIS — F200 Paranoid schizophrenia: Secondary | ICD-10-CM

## 2016-08-19 MED ORDER — CITALOPRAM HYDROBROMIDE 20 MG PO TABS: 20.0000 mg | ORAL_TABLET | ORAL | 5 refills | Status: DC

## 2016-08-19 MED ORDER — ACAMPROSATE CALCIUM 333 MG PO TBEC: 666.0000 mg | DELAYED_RELEASE_TABLET | Freq: Three times a day (TID) | ORAL | 5 refills | Status: DC

## 2016-08-26 ENCOUNTER — Inpatient Hospital Stay: Payer: Self-pay | Admitting: Pulmonary Disease

## 2016-08-26 ENCOUNTER — Encounter: Payer: Self-pay | Admitting: Emergency Medicine

## 2016-08-26 ENCOUNTER — Emergency Department
Admission: EM | Admit: 2016-08-26 | Discharge: 2016-08-26 | Disposition: A | Discharge status: Home / Self Care | Payer: Medicare Other | Attending: Emergency Medicine | Admitting: Emergency Medicine

## 2016-08-26 DIAGNOSIS — E119 Type 2 diabetes mellitus without complications: Secondary | ICD-10-CM | POA: Insufficient documentation

## 2016-08-26 DIAGNOSIS — Z7984 Long term (current) use of oral hypoglycemic drugs: Secondary | ICD-10-CM | POA: Diagnosis not present

## 2016-08-26 DIAGNOSIS — R197 Diarrhea, unspecified: Secondary | ICD-10-CM

## 2016-08-26 DIAGNOSIS — K2920 Alcoholic gastritis without bleeding: Secondary | ICD-10-CM | POA: Insufficient documentation

## 2016-08-26 DIAGNOSIS — I10 Essential (primary) hypertension: Secondary | ICD-10-CM | POA: Insufficient documentation

## 2016-08-26 DIAGNOSIS — Z79899 Other long term (current) drug therapy: Secondary | ICD-10-CM | POA: Insufficient documentation

## 2016-08-26 DIAGNOSIS — J449 Chronic obstructive pulmonary disease, unspecified: Secondary | ICD-10-CM | POA: Insufficient documentation

## 2016-08-26 DIAGNOSIS — R112 Nausea with vomiting, unspecified: Secondary | ICD-10-CM | POA: Diagnosis present

## 2016-08-26 LAB — COMPREHENSIVE METABOLIC PANEL
ALT: 31 U/L (ref 17–63)
AST: 45 U/L — AB (ref 15–41)
Albumin: 3.4 g/dL — ABNORMAL LOW (ref 3.5–5.0)
Alkaline Phosphatase: 107 U/L (ref 38–126)
Anion gap: 7 (ref 5–15)
BUN: 15 mg/dL (ref 6–20)
CHLORIDE: 102 mmol/L (ref 101–111)
CO2: 28 mmol/L (ref 22–32)
CREATININE: 0.74 mg/dL (ref 0.61–1.24)
Calcium: 9 mg/dL (ref 8.9–10.3)
GFR calc non Af Amer: >60 mL/min (ref >60)
Glucose, Bld: 163 mg/dL — ABNORMAL HIGH (ref 65–99)
POTASSIUM: 5.4 mmol/L — AB (ref 3.5–5.1)
SODIUM: 137 mmol/L (ref 135–145)
Total Bilirubin: 0.5 mg/dL (ref 0.3–1.2)
Total Protein: 8 g/dL (ref 6.5–8.1)

## 2016-08-26 LAB — URINALYSIS, COMPLETE (UACMP) WITH MICROSCOPIC
BACTERIA UA: NONE SEEN
BILIRUBIN URINE: NEGATIVE
Glucose, UA: NEGATIVE mg/dL
KETONES UR: NEGATIVE mg/dL
LEUKOCYTES UA: NEGATIVE
NITRITE: NEGATIVE
PROTEIN: 30 mg/dL — AB
Specific Gravity, Urine: 1.02 (ref 1.005–1.030)
pH: 6.5 (ref 5.0–8.0)

## 2016-08-26 LAB — CBC
HEMATOCRIT: 28.5 % — AB (ref 40.0–52.0)
HEMOGLOBIN: 8.8 g/dL — AB (ref 13.0–18.0)
MCH: 23.3 pg — AB (ref 26.0–34.0)
MCHC: 31 g/dL — ABNORMAL LOW (ref 32.0–36.0)
MCV: 75.4 fL — AB (ref 80.0–100.0)
PLATELETS: 157 10*3/uL (ref 150–440)
RBC: 3.78 MIL/uL — AB (ref 4.40–5.90)
RDW: 18.6 % — ABNORMAL HIGH (ref 11.5–14.5)
WBC: 11 10*3/uL — AB (ref 3.8–10.6)

## 2016-08-26 LAB — LIPASE, BLOOD: LIPASE: 27 U/L (ref 11–51)

## 2016-08-26 LAB — AMMONIA: Ammonia: 133 umol/L — ABNORMAL HIGH (ref 9–35)

## 2016-08-26 MED ORDER — ONDANSETRON HCL 4 MG/2ML IJ SOLN
4.0000 mg | Freq: Once | INTRAMUSCULAR | Status: AC
  Administered 2016-08-26: 4 mg via INTRAVENOUS
  Filled 2016-08-26: qty 2

## 2016-08-26 MED ORDER — ALUM & MAG HYDROXIDE-SIMETH 200-200-20 MG/5ML PO SUSP
15.0000 mL | Freq: Once | ORAL | Status: AC
  Administered 2016-08-26: 15 mL via ORAL
  Filled 2016-08-26: qty 30

## 2016-08-26 MED ORDER — ONDANSETRON HCL 4 MG PO TABS: 4.0000 mg | ORAL_TABLET | Freq: Three times a day (TID) | ORAL | 0 refills | Status: DC | PRN

## 2016-08-26 NOTE — ED Triage Notes (Addendum)
Patient presents to the ED with abdominal pain and vomiting that began this morning.  Patient reports vomiting approximately 5-6 times this morning.  Patient states, "I have cirrhosis of the liver and I haven't taken my lactulose in the past few days because I forgot and my mom forgot to remind me."  Patient is in no obvious distress at this time. Patient reports drinking 6 beers last night. Patient usually wears oxygen 2L at baseline.

## 2016-08-26 NOTE — ED Provider Notes (Signed)
[facility] Emergency Department Provider Note ____________________________________________   I have reviewed the triage vital signs and the triage nursing note.  HISTORY  Chief Complaint Abdominal Pain and Emesis   Historian Patient and his mom  HPI Howard [name] is a 35 y.o. male elicit his mom, has a history of schizophrenia, as well as alcohol abuse, alcoholic cirrhosis, and diabetes, presents today with 2 days of nausea and vomiting considered mild to moderate and one episode of loose stool. No fever. No chest pain. No cough. No trouble breathing. No altered mental status. He does report drinking for the past 2 days. He states he used to have Zofran at home but is not having any more.  He has a mild abdominal cramping, but states it is abdomen does not hurt now. No black or bloody stool.  Symptoms are considered mild to moderate, and improving.  Forgot to take his lactulose last 24 hours.    Past Medical History:  Diagnosis Date  . Alcoholic cirrhosis of liver with ascites (HCC)   . Anemia   . COPD (chronic obstructive pulmonary disease) (HCC)   . Depression   . Diabetes (HCC)   . Diabetes mellitus, type II (HCC)   . Esophageal varices (HCC)   . Heart disease   . Hyperlipemia   . Hypertension   . Liver disease   . Multiple thyroid nodules   . Portal hypertensive gastropathy   . Schizophrenia [facility](HCC)     Patient Active Problem List   Diagnosis Date Noted  . DNR (do not resuscitate) discussion 08/11/2016  . Palliative care by specialist 08/11/2016  . Muscle weakness (generalized)   . Acute upper GI bleed   . GI bleed 08/07/2016  . Alcoholic cirrhosis of liver (HCC) 07/09/2016  . Acute respiratory failure with hypercapnia (HCC) 07/09/2016  . Hyperglycemia 07/09/2016  . Seizure (HCC) 06/10/2016  . Altered mental status 05/12/2016  . Headache 04/29/2016  . OSA (obstructive sleep apnea) 04/29/2016  . Anemia 04/29/2016  . Thrombocytopenia  (HCC) 04/29/2016  . Coagulopathy (HCC) 04/29/2016  . Obesity 04/29/2016  . Generalized weakness 04/29/2016  . Acute hepatic encephalopathy 03/14/2016  . Controlled type 2 diabetes mellitus without complication (HCC) 01/15/2016  . Normocytic anemia 12/12/2015  . Essential (primary) hypertension 12/11/2015  . Pure hypercholesterolemia 12/11/2015  . Fever 11/11/2015  . Ascites due to alcoholic cirrhosis (HCC) 11/11/2015  . Cirrhosis of liver with ascites (HCC) 11/11/2015  . Hepatic encephalopathy (HCC) 10/16/2015  . Type 2 diabetes mellitus (HCC) 10/16/2015  . Acute renal failure (ARF) (HCC) 07/30/2015  . 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 Alcoholic cirrhosis (HCC) 07/19/2015  . Hypoxia 07/04/2015  . Elevated transaminase level 07/04/2015  . DOE (dyspnea on exertion) 07/04/2015  . Alcohol abuse 05/31/2015  . Hypertension 05/29/2015  . Paranoid schizophrenia (HCC) 03/20/2015    Past Surgical History:  Procedure Laterality Date  . ESOPHAGOGASTRODUODENOSCOPY N/A 10/05/2015   Procedure: ESOPHAGOGASTRODUODENOSCOPY (EGD);  Surgeon: Elnita MaxwellMatthew Gordon Rein, MD;  [facility];  Service: Endoscopy;  Laterality: N/A;  . ESOPHAGOGASTRODUODENOSCOPY (EGD) WITH PROPOFOL N/A 08/03/2015   Procedure: ESOPHAGOGASTRODUODENOSCOPY (EGD) WITH PROPOFOL;  Surgeon: Elnita MaxwellMatthew Gordon Rein, MD;  [facility];  Service: Endoscopy;  Laterality: N/A;  . ESOPHAGOGASTRODUODENOSCOPY (EGD) WITH PROPOFOL N/A 08/31/2015   Procedure: ESOPHAGOGASTRODUODENOSCOPY (EGD) WITH PROPOFOL;  Surgeon: Elnita MaxwellMatthew Gordon Rein, MD;  [facility];  Service: Endoscopy;  Laterality: N/A;  . ESOPHAGOGASTRODUODENOSCOPY (EGD) WITH PROPOFOL N/A 04/04/2016   Procedure: ESOPHAGOGASTRODUODENOSCOPY (EGD)  WITH PROPOFOL;  Surgeon: Scot Jun, MD;  Location[facility] ENDOSCOPY;  Service: Endoscopy;  Laterality: N/A;  . NO PAST SURGERIES      Prior to Admission medications   Medication Sig  Start Date End Date Taking? Authorizing Provider  acamprosate (CAMPRAL) 333 MG tablet Take 2 tablets (666 mg total) by mouth 3 (three) times daily with meals. 08/19/16   Audery Amel, MD  citalopram (CELEXA) 20 MG tablet Take 1 tablet (20 mg total) by mouth every morning. 08/19/16   Audery Amel, MD  furosemide (LASIX) 20 MG tablet Take 1 tablet (20 mg total) by mouth daily. 05/15/16   Alford Highland, MD  lactulose (CHRONULAC) 10 GM/15ML solution Take 45 mLs (30 g total) by mouth every 4 (four) hours. 07/09/16   Katharina Caper, MD  levETIRAcetam (KEPPRA) 1000 MG tablet Take 1 tablet (1,000 mg total) by mouth 2 (two) times daily. 07/09/16   Katharina Caper, MD  losartan (COZAAR) 25 MG tablet Take 25 mg by mouth daily.    Historical Provider, MD  metFORMIN (GLUCOPHAGE) 500 MG tablet Take 1 tablet (500 mg total) by mouth 2 (two) times daily with a meal. 05/15/16   Alford Highland, MD  modafinil (PROVIGIL) 100 MG tablet Take 1 tablet (100 mg total) by mouth daily. 08/13/16   Auburn Bilberry, MD  Multiple Vitamin (MULTIVITAMIN WITH MINERALS) TABS tablet Take 1 tablet by mouth daily. 08/04/15   Ramonita Lab, MD  nadolol (CORGARD) 20 MG tablet Take 20 mg by mouth daily.    Historical Provider, MD  OLANZapine (ZYPREXA) 15 MG tablet Take 2 tablets (30 mg total) by mouth at bedtime. 05/22/16   Audery Amel, MD  omeprazole (PRILOSEC) 40 MG capsule Take 1 capsule (40 mg total) by mouth every morning. 02/12/16   Enid Baas, MD  ondansetron (ZOFRAN) 4 MG tablet Take 1 tablet (4 mg total) by mouth every 8 (eight) hours as needed for nausea or vomiting. 08/26/16   Governor Rooks, MD  rifaximin (XIFAXAN) 550 MG TABS tablet Take 1 tablet (550 mg total) by mouth 2 (two) times daily. 07/09/16   Katharina Caper, MD  spironolactone (ALDACTONE) 50 MG tablet Take 1 tablet (50 mg total) by mouth 2 (two) times daily. 07/09/16   Katharina Caper, MD  sucralfate (CARAFATE) 1 g tablet Take 1 g by mouth 4 (four) times daily.     Historical Provider, MD    No Known Allergies  Family History  Problem Relation Age of Onset  . Heart disease Mother   . Hypertension Mother   . Hyperlipidemia Mother   . Stroke Father   . Heart attack Father   . Hypertension Father   . Heart disease Father   . Alcohol abuse Father   . Heart disease Brother     Social History Social History  Substance Use Topics  . Smoking status: Never Smoker  . Smokeless tobacco: Never Used  . Alcohol use 30.0 oz/week    50 Cans of beer per week     Comment: 5 beers yesterday    Review of Systems  Constitutional: Negative for fever. Eyes: Negative for visual changes. ENT: Negative for sore throat. Cardiovascular: Negative for chest pain. Respiratory: Negative for shortness of breath. Gastrointestinal: As per history of present illness. Genitourinary: Negative for dysuria. Musculoskeletal: Negative for back pain. Skin: Negative for rash. Neurological: Negative for headache. 10 point Review of Systems otherwise negative ____________________________________________   PHYSICAL EXAM:  VITAL SIGNS: ED Triage Vitals  Enc  Vitals Group     BP 08/26/16 0826 124/75     Pulse Rate 08/26/16 0826 100     Resp 08/26/16 0826 16     Temp 08/26/16 0826 98.4 F (36.9 C)     Temp Source 08/26/16 0826 Oral     SpO2 08/26/16 0826 93 %     Weight 08/26/16 0828 (!) 350 lb (158.8 kg)     Height 08/26/16 0828 6\' 3"  (1.905 m)     Head Circumference --      Peak Flow --      Pain Score 08/26/16 0828 9     Pain Loc --      Pain Edu? --      Excl. in GC? --      Constitutional: Alert and Cooperative. Well appearing and in no distress. HEENT   Head: Normocephalic and atraumatic.      Eyes: Conjunctivae are normal. PERRL. Normal extraocular movements.      Ears:         Nose: No congestion/rhinnorhea.   Mouth/Throat: Mucous membranes are moist.   Neck: No stridor. Cardiovascular/Chest: Normal rate, regular rhythm.  No murmurs,  rubs, or gallops. Respiratory: Normal respiratory effort without tachypnea nor retractions. Breath sounds are clear and equal bilaterally. No wheezes/rales/rhonchi. Gastrointestinal: Soft. No distention, no guarding, no rebound. Nontender to palpation in 4 quadrants.  Genitourinary/rectal:Deferred Musculoskeletal: Nontender with normal range of motion in all extremities. No joint effusions.  No lower extremity tenderness.  No edema. Neurologic:  No slurred speech. No facial droop. No gross or focal neurologic deficits are appreciated. Skin:  Skin is warm, dry and intact. No rash noted. Psychiatric: No depressed mood. Somewhat flat affect. Cooperative and interactive. States he is not to drink alcohol anymore.   ____________________________________________  LABS (pertinent positives/negatives)  Labs Reviewed  COMPREHENSIVE METABOLIC PANEL - Abnormal; Notable for the following:       Result Value   Potassium 5.4 (*)    Glucose, Bld 163 (*)    Albumin 3.4 (*)    AST 45 (*)    All other components within normal limits  CBC - Abnormal; Notable for the following:    WBC 11.0 (*)    RBC 3.78 (*)    Hemoglobin 8.8 (*)    HCT 28.5 (*)    MCV 75.4 (*)    MCH 23.3 (*)    MCHC 31.0 (*)    RDW 18.6 (*)    All other components within normal limits  AMMONIA - Abnormal; Notable for the following:    Ammonia 133 (*)    All other components within normal limits  URINALYSIS, COMPLETE (UACMP) WITH MICROSCOPIC - Abnormal; Notable for the following:    Hgb urine dipstick MODERATE (*)    Protein, ur 30 (*)    Squamous Epithelial / LPF 0-5 (*)    All other components within normal limits  LIPASE, BLOOD    ____________________________________________    EKG I, Governor Rooks, MD, the attending physician have personally viewed and interpreted all ECGs.  97 bpm. Normal sinus rhythm. Narrow QRS. Normal axis. Normal ST and T-wave ____________________________________________  RADIOLOGY All Xrays  were viewed by me. Imaging interpreted by Radiologist.  None __________________________________________  PROCEDURES  Procedure(s) performed: None  Critical Care performed: None  ____________________________________________   ED COURSE / ASSESSMENT AND PLAN  Pertinent labs & imaging results that were available during my care of the patient were reviewed by me and considered in my medical decision making (  see chart for details).   Mr. [name] is overall well-appearing.  States he's had some nausea and vomiting and abdominal cramping over the last 2 days during the same time when he has been drinking alcohol even though is not supposed to. He states he's got a stop that. On exam he has nonfocal abdominal exam with no tenderness now. He states he actually feels much better. I am going to give her dose of Maalox and Zofran for alcoholic gastritis and nausea. I'm going to discharge him with Zofran.  On laboratory evaluation his white blood cell count is slightly elevated, but again at this point in time I don't have a high clinical suspicion for SBP, biliary abnormality, intra-abdominal medical or surgical emergency. I'm not recommending imaging at this point time.  His ammonia is elevated, but he forgot to take his lactulose the last 24 hours, and he is alert and interactive and he does mom state he is given a take it today.   CONSULTATIONS:   None   Patient / Family / Caregiver informed of clinical course, medical decision-making process, and agree with plan.   I discussed return precautions, follow-up instructions, and discharge instructions with patient and/or family.   ___________________________________________   FINAL CLINICAL IMPRESSION(S) / ED DIAGNOSES   Final diagnoses:  Alcoholic gastritis without bleeding, unspecified chronicity  Nausea vomiting and diarrhea              Note: This dictation was prepared with Dragon dictation. Any transcriptional errors  that result from this process are unintentional    Governor Rooksebecca Rayden Scheper, MD 08/26/16 1123

## 2016-08-26 NOTE — Discharge Instructions (Signed)
You were evaluated for vomiting and nausea with mild diarrhea, and as we discussed this may be due to drinking alcohol called alcoholic gastritis, or from a virus.  Your exam and evaluation here in the emergency department are reassuring. Your ammonia level is elevated, and you need to resume your lactulose.  Return to the emergency department for any fever, abdominal pain, confusion or altered mental status, continued or worsening vomiting and diarrhea with concern for dehydration such as not making urine or dry mouth.

## 2016-09-06 NOTE — Progress Notes (Signed)
Follow-up patient was schizophrenia and alcohol abuse. Has unfortunately continued to have spells of drinking. Mood is stable and his hallucinations are under pretty good control. He continues however to drink and his health seems to be getting worse.  Casually dressed. Good eye contact. Slow speech. No obvious delusions. Denies suicidal ideation.  Continue current antipsychotic medicine. We spent quite a bit of time talking about his alcohol abuse. I like him to continue trying to take medication to help control the alcohol abuse and to continue going to regular groups and therapy. Follow-up in another 2 months.

## 2016-09-21 ENCOUNTER — Inpatient Hospital Stay
Admission: EM | Admit: 2016-09-21 | Discharge: 2016-09-23 | DRG: 442 | Disposition: A | Discharge status: Home Health | Payer: Medicare Other | Attending: Internal Medicine | Admitting: Internal Medicine

## 2016-09-21 ENCOUNTER — Emergency Department: Payer: Medicare Other

## 2016-09-21 ENCOUNTER — Encounter: Payer: Self-pay | Admitting: Emergency Medicine

## 2016-09-21 DIAGNOSIS — K3189 Other diseases of stomach and duodenum: Secondary | ICD-10-CM | POA: Diagnosis present

## 2016-09-21 DIAGNOSIS — K7682 Hepatic encephalopathy: Secondary | ICD-10-CM

## 2016-09-21 DIAGNOSIS — I10 Essential (primary) hypertension: Secondary | ICD-10-CM | POA: Diagnosis present

## 2016-09-21 DIAGNOSIS — Z9981 Dependence on supplemental oxygen: Secondary | ICD-10-CM

## 2016-09-21 DIAGNOSIS — E785 Hyperlipidemia, unspecified: Secondary | ICD-10-CM | POA: Diagnosis present

## 2016-09-21 DIAGNOSIS — E7220 Disorder of urea cycle metabolism, unspecified: Secondary | ICD-10-CM | POA: Diagnosis present

## 2016-09-21 DIAGNOSIS — Y92008 Other place in unspecified non-institutional (private) residence as the place of occurrence of the external cause: Secondary | ICD-10-CM

## 2016-09-21 DIAGNOSIS — R531 Weakness: Secondary | ICD-10-CM | POA: Diagnosis not present

## 2016-09-21 DIAGNOSIS — Z8249 Family history of ischemic heart disease and other diseases of the circulatory system: Secondary | ICD-10-CM

## 2016-09-21 DIAGNOSIS — R2681 Unsteadiness on feet: Secondary | ICD-10-CM

## 2016-09-21 DIAGNOSIS — W010XXA Fall on same level from slipping, tripping and stumbling without subsequent striking against object, initial encounter: Secondary | ICD-10-CM | POA: Diagnosis present

## 2016-09-21 DIAGNOSIS — F1010 Alcohol abuse, uncomplicated: Secondary | ICD-10-CM | POA: Diagnosis present

## 2016-09-21 DIAGNOSIS — F200 Paranoid schizophrenia: Secondary | ICD-10-CM | POA: Diagnosis present

## 2016-09-21 DIAGNOSIS — Z7984 Long term (current) use of oral hypoglycemic drugs: Secondary | ICD-10-CM

## 2016-09-21 DIAGNOSIS — E119 Type 2 diabetes mellitus without complications: Secondary | ICD-10-CM | POA: Diagnosis present

## 2016-09-21 DIAGNOSIS — K7290 Hepatic failure, unspecified without coma: Secondary | ICD-10-CM | POA: Diagnosis not present

## 2016-09-21 DIAGNOSIS — Z811 Family history of alcohol abuse and dependence: Secondary | ICD-10-CM

## 2016-09-21 DIAGNOSIS — K7031 Alcoholic cirrhosis of liver with ascites: Secondary | ICD-10-CM | POA: Diagnosis present

## 2016-09-21 DIAGNOSIS — F1020 Alcohol dependence, uncomplicated: Secondary | ICD-10-CM | POA: Diagnosis present

## 2016-09-21 DIAGNOSIS — Z823 Family history of stroke: Secondary | ICD-10-CM

## 2016-09-21 DIAGNOSIS — F329 Major depressive disorder, single episode, unspecified: Secondary | ICD-10-CM | POA: Diagnosis present

## 2016-09-21 DIAGNOSIS — Z79899 Other long term (current) drug therapy: Secondary | ICD-10-CM

## 2016-09-21 DIAGNOSIS — R140 Abdominal distension (gaseous): Secondary | ICD-10-CM

## 2016-09-21 DIAGNOSIS — K766 Portal hypertension: Secondary | ICD-10-CM | POA: Diagnosis present

## 2016-09-21 DIAGNOSIS — J449 Chronic obstructive pulmonary disease, unspecified: Secondary | ICD-10-CM | POA: Diagnosis present

## 2016-09-21 LAB — COMPREHENSIVE METABOLIC PANEL
ALT: 28 U/L (ref 17–63)
ANION GAP: 7 (ref 5–15)
AST: 46 U/L — ABNORMAL HIGH (ref 15–41)
Albumin: 3.5 g/dL (ref 3.5–5.0)
Alkaline Phosphatase: 128 U/L — ABNORMAL HIGH (ref 38–126)
BUN: 14 mg/dL (ref 6–20)
CHLORIDE: 99 mmol/L — AB (ref 101–111)
CO2: 33 mmol/L — AB (ref 22–32)
CREATININE: 0.89 mg/dL (ref 0.61–1.24)
Calcium: 8.7 mg/dL — ABNORMAL LOW (ref 8.9–10.3)
Glucose, Bld: 175 mg/dL — ABNORMAL HIGH (ref 65–99)
Potassium: 4.9 mmol/L (ref 3.5–5.1)
SODIUM: 139 mmol/L (ref 135–145)
Total Bilirubin: 0.4 mg/dL (ref 0.3–1.2)
Total Protein: 7.8 g/dL (ref 6.5–8.1)

## 2016-09-21 LAB — CBC WITH DIFFERENTIAL/PLATELET
BASOS PCT: 1 %
Basophils Absolute: 0.1 10*3/uL (ref 0–0.1)
EOS ABS: 0.3 10*3/uL (ref 0–0.7)
Eosinophils Relative: 5 %
HCT: 26.8 % — ABNORMAL LOW (ref 40.0–52.0)
HEMOGLOBIN: 8.2 g/dL — AB (ref 13.0–18.0)
LYMPHS ABS: 1.2 10*3/uL (ref 1.0–3.6)
Lymphocytes Relative: 17 %
MCH: 23.2 pg — ABNORMAL LOW (ref 26.0–34.0)
MCHC: 30.6 g/dL — ABNORMAL LOW (ref 32.0–36.0)
MCV: 75.9 fL — ABNORMAL LOW (ref 80.0–100.0)
MONOS PCT: 9 %
Monocytes Absolute: 0.6 10*3/uL (ref 0.2–1.0)
NEUTROS PCT: 68 %
Neutro Abs: 4.7 10*3/uL (ref 1.4–6.5)
Platelets: 134 10*3/uL — ABNORMAL LOW (ref 150–440)
RBC: 3.54 MIL/uL — AB (ref 4.40–5.90)
RDW: 18.5 % — ABNORMAL HIGH (ref 11.5–14.5)
WBC: 6.8 10*3/uL (ref 3.8–10.6)

## 2016-09-21 LAB — URINALYSIS, COMPLETE (UACMP) WITH MICROSCOPIC
BILIRUBIN URINE: NEGATIVE
Bacteria, UA: NONE SEEN
GLUCOSE, UA: NEGATIVE mg/dL
KETONES UR: NEGATIVE mg/dL
LEUKOCYTES UA: NEGATIVE
Nitrite: NEGATIVE
Protein, ur: 30 mg/dL — AB
SPECIFIC GRAVITY, URINE: 1.012 (ref 1.005–1.030)
pH: 6 (ref 5.0–8.0)

## 2016-09-21 LAB — TROPONIN I: Troponin I: <0.03 ng/mL (ref <0.03)

## 2016-09-21 LAB — AMMONIA: AMMONIA: 125 umol/L — AB (ref 9–35)

## 2016-09-21 MED ORDER — IPRATROPIUM-ALBUTEROL 0.5-2.5 (3) MG/3ML IN SOLN
3.0000 mL | Freq: Once | RESPIRATORY_TRACT | Status: AC
  Administered 2016-09-22: 3 mL via RESPIRATORY_TRACT
  Filled 2016-09-21: qty 3

## 2016-09-21 MED ORDER — LACTULOSE 10 GM/15ML PO SOLN
30.0000 g | Freq: Once | ORAL | Status: AC
  Administered 2016-09-22: 30 g via ORAL
  Filled 2016-09-21: qty 60

## 2016-09-21 NOTE — ED Notes (Signed)
Patient transported to CT and XR 

## 2016-09-21 NOTE — ED Provider Notes (Signed)
[facility] Emergency Department Provider Note   ____________________________________________   First MD Initiated Contact with Patient 09/21/16 2325     (approximate)  I have reviewed the triage vital signs and the nursing notes.   HISTORY  Chief Complaint Fall and Weakness    HPI Howard [name] is a 35 y.o. male who comes into the hospital today with a fall. The patient reports that he was walking and tripped on a board between the kitchen in the hallway. The patient reports that he fell face forward and hit his face. The patient reports that he doesn't think he passed out and he also doesn't think that he had a seizure. He reports that he has been not feeling well all day and thinks his liver is acting up. He reports that he has been taking his lactulose regularly but he had not been taking it for some time. He reports that he was thinking about coming in any way and when he fell he decided to actually come and get checked out. The patient reports that his hands have been shaking and jerking more than normal which makes him think his liver is acting up. He's had a few episodes of chest pain and shortness of breath. The patient also has had some nausea. He had a few beers on Friday but hasn't had anything since then.   Past Medical History:  Diagnosis Date  . Alcoholic cirrhosis of liver with ascites (HCC)   . Anemia   . COPD (chronic obstructive pulmonary disease) (HCC)   . Depression   . Diabetes (HCC)   . Diabetes mellitus, type II (HCC)   . Esophageal varices (HCC)   . Heart disease   . Hyperlipemia   . Hypertension   . Liver disease   . Multiple thyroid nodules   . Portal hypertensive gastropathy   . Schizophrenia [facility])     Patient Active Problem List   Diagnosis Date Noted  . DNR (do not resuscitate) discussion 08/11/2016  . Palliative care by specialist 08/11/2016  . Muscle weakness (generalized)   . Acute upper GI bleed   . GI bleed  08/07/2016  . Alcoholic cirrhosis of liver (HCC) 07/09/2016  . Acute respiratory failure with hypercapnia (HCC) 07/09/2016  . Hyperglycemia 07/09/2016  . Seizure (HCC) 06/10/2016  . Altered mental status 05/12/2016  . Headache 04/29/2016  . OSA (obstructive sleep apnea) 04/29/2016  . Anemia 04/29/2016  . Thrombocytopenia (HCC) 04/29/2016  . Coagulopathy (HCC) 04/29/2016  . Obesity 04/29/2016  . Generalized weakness 04/29/2016  . Acute hepatic encephalopathy 03/14/2016  . Controlled type 2 diabetes mellitus without complication (HCC) 01/15/2016  . Normocytic anemia 12/12/2015  . Essential (primary) hypertension 12/11/2015  . Pure hypercholesterolemia 12/11/2015  . Fever 11/11/2015  . Ascites due to alcoholic cirrhosis (HCC) 11/11/2015  . Cirrhosis of liver with ascites (HCC) 11/11/2015  . Hepatic encephalopathy (HCC) 10/16/2015  . Type 2 diabetes mellitus (HCC) 10/16/2015  . Acute renal failure (ARF) (HCC) 07/30/2015  . 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 Alcoholic cirrhosis (HCC) 07/19/2015  . Hypoxia 07/04/2015  . Elevated transaminase level 07/04/2015  . DOE (dyspnea on exertion) 07/04/2015  . Alcohol abuse 05/31/2015  . Hypertension 05/29/2015  . Paranoid schizophrenia (HCC) 03/20/2015    Past Surgical History:  Procedure Laterality Date  . ESOPHAGOGASTRODUODENOSCOPY N/A 10/05/2015   Procedure: ESOPHAGOGASTRODUODENOSCOPY (EGD);  Surgeon: Elnita Maxwell, MD;  Location: [facility] ENDOSCOPY;  Service: Endoscopy;  Laterality: N/A;  .  ESOPHAGOGASTRODUODENOSCOPY (EGD) WITH PROPOFOL N/A 08/03/2015   Procedure: ESOPHAGOGASTRODUODENOSCOPY (EGD) WITH PROPOFOL;  Surgeon: Elnita Maxwell, MD;  Location: [facility] ENDOSCOPY;  Service: Endoscopy;  Laterality: N/A;  . ESOPHAGOGASTRODUODENOSCOPY (EGD) WITH PROPOFOL N/A 08/31/2015   Procedure: ESOPHAGOGASTRODUODENOSCOPY (EGD) WITH PROPOFOL;  Surgeon: Elnita Maxwell, MD;  Location: [facility] ENDOSCOPY;  Service:  Endoscopy;  Laterality: N/A;  . ESOPHAGOGASTRODUODENOSCOPY (EGD) WITH PROPOFOL N/A 04/04/2016   Procedure: ESOPHAGOGASTRODUODENOSCOPY (EGD) WITH PROPOFOL;  Surgeon: Scot Jun, MD;  Location: [facility] ENDOSCOPY;  Service: Endoscopy;  Laterality: N/A;  . NO PAST SURGERIES      Prior to Admission medications   Medication Sig Start Date End Date Taking? Authorizing Provider  acamprosate (CAMPRAL) 333 MG tablet Take 2 tablets (666 mg total) by mouth 3 (three) times daily with meals. 08/19/16   Audery Amel, MD  citalopram (CELEXA) 20 MG tablet Take 1 tablet (20 mg total) by mouth every morning. 08/19/16   Audery Amel, MD  furosemide (LASIX) 20 MG tablet Take 1 tablet (20 mg total) by mouth daily. 05/15/16   Alford Highland, MD  lactulose (CHRONULAC) 10 GM/15ML solution Take 45 mLs (30 g total) by mouth every 4 (four) hours. 07/09/16   Katharina Caper, MD  levETIRAcetam (KEPPRA) 1000 MG tablet Take 1 tablet (1,000 mg total) by mouth 2 (two) times daily. 07/09/16   Katharina Caper, MD  losartan (COZAAR) 25 MG tablet Take 25 mg by mouth daily.    Historical Provider, MD  metFORMIN (GLUCOPHAGE) 500 MG tablet Take 1 tablet (500 mg total) by mouth 2 (two) times daily with a meal. 05/15/16   Alford Highland, MD  modafinil (PROVIGIL) 100 MG tablet Take 1 tablet (100 mg total) by mouth daily. 08/13/16   Auburn Bilberry, MD  Multiple Vitamin (MULTIVITAMIN WITH MINERALS) TABS tablet Take 1 tablet by mouth daily. 08/04/15   Ramonita Lab, MD  nadolol (CORGARD) 20 MG tablet Take 20 mg by mouth daily.    Historical Provider, MD  OLANZapine (ZYPREXA) 15 MG tablet Take 2 tablets (30 mg total) by mouth at bedtime. 05/22/16   Audery Amel, MD  omeprazole (PRILOSEC) 40 MG capsule Take 1 capsule (40 mg total) by mouth every morning. 02/12/16   Enid Baas, MD  ondansetron (ZOFRAN) 4 MG tablet Take 1 tablet (4 mg total) by mouth every 8 (eight) hours as needed for nausea or vomiting. 08/26/16   Governor Rooks, MD    rifaximin (XIFAXAN) 550 MG TABS tablet Take 1 tablet (550 mg total) by mouth 2 (two) times daily. 07/09/16   Katharina Caper, MD  spironolactone (ALDACTONE) 50 MG tablet Take 1 tablet (50 mg total) by mouth 2 (two) times daily. 07/09/16   Katharina Caper, MD  sucralfate (CARAFATE) 1 g tablet Take 1 g by mouth 4 (four) times daily.    Historical Provider, MD    Allergies Patient has no known allergies.  Family History  Problem Relation Age of Onset  . Heart disease Mother   . Hypertension Mother   . Hyperlipidemia Mother   . Stroke Father   . Heart attack Father   . Hypertension Father   . Heart disease Father   . Alcohol abuse Father   . Heart disease Brother     Social History Social History  Substance Use Topics  . Smoking status: Never Smoker  . Smokeless tobacco: Never Used  . Alcohol use 30.0 oz/week    50 Cans of beer per week     Comment: 5 beers  yesterday    Review of Systems Constitutional: No fever/chills Eyes: No visual changes. ENT: No sore throat. Cardiovascular:  chest pain. Respiratory:  shortness of breath. Gastrointestinal:  abdominal pain.  No nausea, no vomiting.  No diarrhea.  No constipation. Genitourinary: Negative for dysuria. Musculoskeletal: Negative for back pain. Skin: Negative for rash. Neurological: Negative for headaches, focal weakness or numbness.  10-point ROS otherwise negative.  ____________________________________________   PHYSICAL EXAM:  VITAL SIGNS: ED Triage Vitals  Enc Vitals Group     BP 09/21/16 2147 128/78     Pulse Rate 09/21/16 2147 95     Resp --      Temp 09/21/16 2147 98.3 F (36.8 C)     Temp Source 09/21/16 2147 Oral     SpO2 09/21/16 2147 94 %     Weight 09/21/16 2200 (!) 350 lb (158.8 kg)     Height 09/21/16 2200 6\' 3"  (1.905 m)     Head Circumference --      Peak Flow --      Pain Score 09/21/16 2201 0     Pain Loc --      Pain Edu? --      Excl. in GC? --     Constitutional: Alert and oriented.  Well appearing and in mild distress. Eyes: Conjunctivae are normal. PERRL. EOMI. Head: Atraumatic. Nose: No congestion/rhinnorhea. Mouth/Throat: Mucous membranes are moist.  Oropharynx non-erythematous. Cardiovascular: Normal rate, regular rhythm. Grossly normal heart sounds.  Good peripheral circulation. Respiratory: Normal respiratory effort.  No retractions. Expiratory wheezes throughout all lung fields Gastrointestinal: Soft with some RUQ abdominal pain. No distention. Positive bowel sounds Musculoskeletal: No lower extremity tenderness nor edema.   Neurologic:  Normal speech and language.  Skin:  Skin is warm, dry and intact.  Psychiatric: Mood and affect are normal.   ____________________________________________   LABS (all labs ordered are listed, but only abnormal results are displayed)  Labs Reviewed  COMPREHENSIVE METABOLIC PANEL - Abnormal; Notable for the following:       Result Value   Chloride 99 (*)    CO2 33 (*)    Glucose, Bld 175 (*)    Calcium 8.7 (*)    AST 46 (*)    Alkaline Phosphatase 128 (*)    All other components within normal limits  CBC WITH DIFFERENTIAL/PLATELET - Abnormal; Notable for the following:    RBC 3.54 (*)    Hemoglobin 8.2 (*)    HCT 26.8 (*)    MCV 75.9 (*)    MCH 23.2 (*)    MCHC 30.6 (*)    RDW 18.5 (*)    Platelets 134 (*)    All other components within normal limits  AMMONIA - Abnormal; Notable for the following:    Ammonia 125 (*)    All other components within normal limits  URINALYSIS, COMPLETE (UACMP) WITH MICROSCOPIC - Abnormal; Notable for the following:    Color, Urine YELLOW (*)    APPearance CLEAR (*)    Hgb urine dipstick LARGE (*)    Protein, ur 30 (*)    Squamous Epithelial / LPF 0-5 (*)    All other components within normal limits  TROPONIN I   ____________________________________________  EKG  ED ECG REPORT I, Rebecka ApleyWebster,  Mckenze Slone P, the attending physician, personally viewed and interpreted this ECG.    Date: 09/21/2016  EKG Time: 2149  Rate: 92  Rhythm: normal sinus rhythm  Axis: normal  Intervals:none  ST&T Change: none  ____________________________________________  RADIOLOGY  CXR ____________________________________________   PROCEDURES  Procedure(s) performed: None  Procedures  Critical Care performed: No  ____________________________________________   INITIAL IMPRESSION / ASSESSMENT AND PLAN / ED COURSE  Pertinent labs & imaging results that were available during my care of the patient were reviewed by me and considered in my medical decision making (see chart for details).  This is a 35 year old male who comes into the hospital today after a fall. The patient is also had some weakness. He does have a history of cirrhosis and elevated ammonia levels. The patient does have an elevated ammonia here and reports that he has been taking his medication. He has some right upper quadrant pain which is not abnormal for him and he has been drinking. I Wilson the patient for a CT scan of his head and cervical spine. I will give the patient a DuoNeb as he does have some mild expiratory wheezes and also in for chest x-ray. The patient will be reassessed.  Clinical Course as of Sep 22 106  Mon Sep 22, 2016  0031 Mild bronchitic changes.  LEFT lung base scarring. DG Chest 2 View [AW]  0032 1. Normal brain 2. Negative for acute cervical spine fracture. Mild motion degradation. CT Head Wo Contrast [AW]    Clinical Course User Index [AW] Rebecka ApleyAllison P Maudell Stanbrough, MD   The patient's imaging studies are unremarkable but given his elevated ammonia levels I will admit the patient to the hospitalist service.  ____________________________________________   FINAL CLINICAL IMPRESSION(S) / ED DIAGNOSES  Final diagnoses:  Weakness  Hyperammonemia (HCC)  Hepatic encephalopathy (HCC)      NEW MEDICATIONS STARTED DURING THIS VISIT:  New Prescriptions   No medications on file      Note:  This document was prepared using Dragon voice recognition software and may include unintentional dictation errors.    Rebecka ApleyAllison P Miachel Nardelli, MD 09/22/16 [phone]

## 2016-09-21 NOTE — ED Triage Notes (Signed)
Patient to ED via EMS status post fall.  Reports feeling "weak" all day.  Reports mechanical fall after tripping over door threshold.  Denies hitting head or loss of consciousness.

## 2016-09-22 ENCOUNTER — Encounter: Payer: Self-pay | Admitting: Internal Medicine

## 2016-09-22 LAB — GLUCOSE, CAPILLARY
GLUCOSE-CAPILLARY: 222 mg/dL — AB (ref 65–99)
Glucose-Capillary: 188 mg/dL — ABNORMAL HIGH (ref 65–99)
Glucose-Capillary: 118 mg/dL — ABNORMAL HIGH (ref 65–99)
Glucose-Capillary: 102 mg/dL — ABNORMAL HIGH (ref 65–99)

## 2016-09-22 LAB — CBC
HEMATOCRIT: 25.1 % — AB (ref 40.0–52.0)
HEMOGLOBIN: 7.7 g/dL — AB (ref 13.0–18.0)
MCH: 23.4 pg — ABNORMAL LOW (ref 26.0–34.0)
MCHC: 30.8 g/dL — ABNORMAL LOW (ref 32.0–36.0)
MCV: 76 fL — AB (ref 80.0–100.0)
Platelets: 116 10*3/uL — ABNORMAL LOW (ref 150–440)
RBC: 3.3 MIL/uL — AB (ref 4.40–5.90)
RDW: 18.9 % — ABNORMAL HIGH (ref 11.5–14.5)
WBC: 7.4 10*3/uL (ref 3.8–10.6)

## 2016-09-22 LAB — BASIC METABOLIC PANEL
ANION GAP: 5 (ref 5–15)
BUN: 15 mg/dL (ref 6–20)
CALCIUM: 8.9 mg/dL (ref 8.9–10.3)
CHLORIDE: 100 mmol/L — AB (ref 101–111)
CO2: 36 mmol/L — AB (ref 22–32)
Creatinine, Ser: 0.86 mg/dL (ref 0.61–1.24)
GFR calc non Af Amer: >60 mL/min (ref >60)
Glucose, Bld: 79 mg/dL (ref 65–99)
POTASSIUM: 4.5 mmol/L (ref 3.5–5.1)
Sodium: 141 mmol/L (ref 135–145)

## 2016-09-22 LAB — AMMONIA: Ammonia: 95 umol/L — ABNORMAL HIGH (ref 9–35)

## 2016-09-22 MED ORDER — ONDANSETRON HCL 4 MG/2ML IJ SOLN: 4.0000 mg | Freq: Four times a day (QID) | INTRAMUSCULAR | Status: DC | PRN

## 2016-09-22 MED ORDER — LORAZEPAM 2 MG PO TABS
0.0000 mg | ORAL_TABLET | Freq: Four times a day (QID) | ORAL | Status: DC
  Administered 2016-09-22: 21:00:00 2 mg via ORAL
  Filled 2016-09-22 (×2): qty 1

## 2016-09-22 MED ORDER — NADOLOL 20 MG PO TABS
20.0000 mg | ORAL_TABLET | Freq: Every day | ORAL | Status: DC
  Administered 2016-09-22 – 2016-09-23 (×2): 20 mg via ORAL
  Filled 2016-09-22 (×2): qty 1

## 2016-09-22 MED ORDER — ORAL CARE MOUTH RINSE
15.0000 mL | Freq: Two times a day (BID) | OROMUCOSAL | Status: DC
  Administered 2016-09-22 (×2): 15 mL via OROMUCOSAL

## 2016-09-22 MED ORDER — ONDANSETRON HCL 4 MG PO TABS
4.0000 mg | ORAL_TABLET | Freq: Four times a day (QID) | ORAL | Status: DC | PRN
Start: 2016-09-22 — End: 2016-09-23

## 2016-09-22 MED ORDER — PANTOPRAZOLE SODIUM 40 MG PO TBEC
40.0000 mg | DELAYED_RELEASE_TABLET | Freq: Every day | ORAL | Status: DC
  Administered 2016-09-22 – 2016-09-23 (×2): 40 mg via ORAL
  Filled 2016-09-22 (×2): qty 1

## 2016-09-22 MED ORDER — LEVETIRACETAM 500 MG PO TABS
1000.0000 mg | ORAL_TABLET | Freq: Two times a day (BID) | ORAL | Status: DC
  Administered 2016-09-22 – 2016-09-23 (×3): 1000 mg via ORAL
  Filled 2016-09-22 (×3): qty 2

## 2016-09-22 MED ORDER — MODAFINIL 100 MG PO TABS
100.0000 mg | ORAL_TABLET | Freq: Every day | ORAL | Status: DC
  Administered 2016-09-22 – 2016-09-23 (×2): 100 mg via ORAL
  Filled 2016-09-22 (×2): qty 1

## 2016-09-22 MED ORDER — VITAMIN B-1 100 MG PO TABS
100.0000 mg | ORAL_TABLET | Freq: Every day | ORAL | Status: DC
  Administered 2016-09-22 – 2016-09-23 (×2): 100 mg via ORAL
  Filled 2016-09-22 (×2): qty 1

## 2016-09-22 MED ORDER — CITALOPRAM HYDROBROMIDE 20 MG PO TABS
20.0000 mg | ORAL_TABLET | ORAL | Status: DC
  Administered 2016-09-22 – 2016-09-23 (×2): 20 mg via ORAL
  Filled 2016-09-22 (×2): qty 1

## 2016-09-22 MED ORDER — INSULIN ASPART 100 UNIT/ML ~~LOC~~ SOLN: 0.0000 [IU] | Freq: Every day | SUBCUTANEOUS | Status: DC

## 2016-09-22 MED ORDER — OLANZAPINE 10 MG PO TABS
30.0000 mg | ORAL_TABLET | Freq: Every day | ORAL | Status: DC
  Administered 2016-09-22: 30 mg via ORAL
  Filled 2016-09-22: qty 3

## 2016-09-22 MED ORDER — RIFAXIMIN 550 MG PO TABS
550.0000 mg | ORAL_TABLET | Freq: Two times a day (BID) | ORAL | Status: DC
  Administered 2016-09-22 – 2016-09-23 (×3): 550 mg via ORAL
  Filled 2016-09-22 (×3): qty 1

## 2016-09-22 MED ORDER — IBUPROFEN 400 MG PO TABS: 200.0000 mg | ORAL_TABLET | Freq: Four times a day (QID) | ORAL | Status: DC | PRN

## 2016-09-22 MED ORDER — SUCRALFATE 1 G PO TABS
1.0000 g | ORAL_TABLET | Freq: Four times a day (QID) | ORAL | Status: DC
  Administered 2016-09-22 – 2016-09-23 (×5): 1 g via ORAL
  Filled 2016-09-22 (×9): qty 1

## 2016-09-22 MED ORDER — LORAZEPAM 2 MG/ML IJ SOLN: 1.0000 mg | Freq: Four times a day (QID) | INTRAMUSCULAR | Status: DC | PRN

## 2016-09-22 MED ORDER — SPIRONOLACTONE 25 MG PO TABS
50.0000 mg | ORAL_TABLET | Freq: Two times a day (BID) | ORAL | Status: DC
  Administered 2016-09-22 – 2016-09-23 (×3): 50 mg via ORAL
  Filled 2016-09-22 (×3): qty 2

## 2016-09-22 MED ORDER — LACTULOSE 10 GM/15ML PO SOLN
30.0000 g | Freq: Three times a day (TID) | ORAL | Status: DC
  Administered 2016-09-22 – 2016-09-23 (×4): 30 g via ORAL
  Filled 2016-09-22 (×4): qty 60

## 2016-09-22 MED ORDER — SODIUM CHLORIDE 0.9% FLUSH
3.0000 mL | Freq: Two times a day (BID) | INTRAVENOUS | Status: DC
  Administered 2016-09-22 – 2016-09-23 (×3): 3 mL via INTRAVENOUS

## 2016-09-22 MED ORDER — ADULT MULTIVITAMIN W/MINERALS CH
1.0000 | ORAL_TABLET | Freq: Every day | ORAL | Status: DC
  Administered 2016-09-22 – 2016-09-23 (×2): 1 via ORAL
  Filled 2016-09-22 (×2): qty 1

## 2016-09-22 MED ORDER — ENOXAPARIN SODIUM 40 MG/0.4ML ~~LOC~~ SOLN
40.0000 mg | Freq: Two times a day (BID) | SUBCUTANEOUS | Status: DC
  Administered 2016-09-22 – 2016-09-23 (×3): 40 mg via SUBCUTANEOUS
  Filled 2016-09-22 (×3): qty 0.4

## 2016-09-22 MED ORDER — LORAZEPAM 2 MG PO TABS: 0.0000 mg | ORAL_TABLET | Freq: Two times a day (BID) | ORAL | Status: DC

## 2016-09-22 MED ORDER — LORAZEPAM 1 MG PO TABS: 1.0000 mg | ORAL_TABLET | Freq: Four times a day (QID) | ORAL | Status: DC | PRN

## 2016-09-22 MED ORDER — THIAMINE HCL 100 MG/ML IJ SOLN: 100.0000 mg | Freq: Every day | INTRAMUSCULAR | Status: DC

## 2016-09-22 MED ORDER — INSULIN ASPART 100 UNIT/ML ~~LOC~~ SOLN
0.0000 [IU] | Freq: Three times a day (TID) | SUBCUTANEOUS | Status: DC
  Administered 2016-09-22: 3 [IU] via SUBCUTANEOUS
  Administered 2016-09-22: 13:00:00 2 [IU] via SUBCUTANEOUS
  Administered 2016-09-23: 12:00:00 1 [IU] via SUBCUTANEOUS
  Filled 2016-09-22: qty 3
  Filled 2016-09-22: qty 2
  Filled 2016-09-22: qty 1

## 2016-09-22 MED ORDER — FOLIC ACID 1 MG PO TABS
1.0000 mg | ORAL_TABLET | Freq: Every day | ORAL | Status: DC
  Administered 2016-09-22 – 2016-09-23 (×2): 1 mg via ORAL
  Filled 2016-09-22 (×2): qty 1

## 2016-09-22 NOTE — Progress Notes (Signed)
Admitted because of recurrent encephalopathy, started on lactulose, but today morning patient is alert awake oriented. Denies any shortness of breath. Patient had multiple falls in the last few days.  VITALS;REVIWED.  Alert awake oriented, cardiovascular S1-S2 regular,  lungs clear to auscultation,  abdomen distended with ascites, fluid thrill present.  Bowel sounds present. No organomegaly. Assessment and plan;    hepatic encephalopathy; continue lactulose, repeat ammonia level today   #2. LIVER cirrhosis with ascites ultrasound guided paracentesis today or tomorrow / #3 multiple falls: Continue evaluation with physical therapy  For diabetes mellitus type 2; #5.ESOPHAGEAL VARICES #6 hypertension #7 hyperlipidemia Schizophrenia

## 2016-09-22 NOTE — ED Notes (Signed)
Transporting patient to room 122-1C

## 2016-09-22 NOTE — H&P (Signed)
[facility] - [place] at [facility]   PATIENT NAME: Howard [name]    MR#:  [id number]  DATE OF BIRTH:  03-03-1981  DATE OF ADMISSION:  09/21/2016  PRIMARY CARE PHYSICIAN: Marisue Ivan, MD   REQUESTING/REFERRING PHYSICIAN: Zenda Alpers, MD  CHIEF COMPLAINT:   Chief Complaint  Patient presents with  . Fall  . Weakness    HISTORY OF PRESENT ILLNESS:  Howard [name]  is a 36 y.o. male who presents with Increased falls at home. Patient states that he has been feeling weak over the last few days and has fallen several times. He has a history of hepatic cirrhosis with prior episodes of hepatic encephalopathy. Here in the ED today his ammonia level is elevated at 125. He states his only been taking his lactulose once or twice a day. He also states that he drank some alcohol 2 days ago. Given his elevated ammonia level hospitalists were called for admission.  PAST MEDICAL HISTORY:   Past Medical History:  Diagnosis Date  . Alcoholic cirrhosis of liver with ascites (HCC)   . Anemia   . COPD (chronic obstructive pulmonary disease) (HCC)   . Depression   . Diabetes (HCC)   . Diabetes mellitus, type II (HCC)   . Esophageal varices (HCC)   . Heart disease   . Hyperlipemia   . Hypertension   . Liver disease   . Multiple thyroid nodules   . Portal hypertensive gastropathy   . Schizophrenia (HCC)     PAST SURGICAL HISTORY:   Past Surgical History:  Procedure Laterality Date  . ESOPHAGOGASTRODUODENOSCOPY N/A 10/05/2015   Procedure: ESOPHAGOGASTRODUODENOSCOPY (EGD);  Surgeon: Elnita Maxwell, MD;  Location: [facility] ENDOSCOPY;  Service: Endoscopy;  Laterality: N/A;  . ESOPHAGOGASTRODUODENOSCOPY (EGD) WITH PROPOFOL N/A 08/03/2015   Procedure: ESOPHAGOGASTRODUODENOSCOPY (EGD) WITH PROPOFOL;  Surgeon: Elnita Maxwell, MD;  Location: [facility] ENDOSCOPY;  Service: Endoscopy;  Laterality: N/A;  . ESOPHAGOGASTRODUODENOSCOPY (EGD) WITH PROPOFOL N/A 08/31/2015   Procedure:  ESOPHAGOGASTRODUODENOSCOPY (EGD) WITH PROPOFOL;  Surgeon: Elnita Maxwell, MD;  Location: [facility] ENDOSCOPY;  Service: Endoscopy;  Laterality: N/A;  . ESOPHAGOGASTRODUODENOSCOPY (EGD) WITH PROPOFOL N/A 04/04/2016   Procedure: ESOPHAGOGASTRODUODENOSCOPY (EGD) WITH PROPOFOL;  Surgeon: Scot Jun, MD;  Location: [facility] ENDOSCOPY;  Service: Endoscopy;  Laterality: N/A;  . NO PAST SURGERIES      SOCIAL HISTORY:   Social History  Substance Use Topics  . Smoking status: Never Smoker  . Smokeless tobacco: Never Used  . Alcohol use 30.0 oz/week    50 Cans of beer per week     Comment: 5 beers yesterday    FAMILY HISTORY:   Family History  Problem Relation Age of Onset  . Heart disease Mother   . Hypertension Mother   . Hyperlipidemia Mother   . Stroke Father   . Heart attack Father   . Hypertension Father   . Heart disease Father   . Alcohol abuse Father   . Heart disease Brother     DRUG ALLERGIES:  No Known Allergies  MEDICATIONS AT HOME:   Prior to Admission medications   Medication Sig Start Date End Date Taking? Authorizing Provider  acamprosate (CAMPRAL) 333 MG tablet Take 2 tablets (666 mg total) by mouth 3 (three) times daily with meals. 08/19/16   Audery Amel, MD  citalopram (CELEXA) 20 MG tablet Take 1 tablet (20 mg total) by mouth every morning. 08/19/16   Audery Amel, MD  furosemide (LASIX) 20 MG tablet Take 1 tablet (  20 mg total) by mouth daily. 05/15/16   Alford Highlandichard Wieting, MD  lactulose (CHRONULAC) 10 GM/15ML solution Take 45 mLs (30 g total) by mouth every 4 (four) hours. 07/09/16   Katharina Caperima Vaickute, MD  levETIRAcetam (KEPPRA) 1000 MG tablet Take 1 tablet (1,000 mg total) by mouth 2 (two) times daily. 07/09/16   Katharina Caperima Vaickute, MD  losartan (COZAAR) 25 MG tablet Take 25 mg by mouth daily.    Historical Provider, MD  metFORMIN (GLUCOPHAGE) 500 MG tablet Take 1 tablet (500 mg total) by mouth 2 (two) times daily with a meal. 05/15/16   Alford Highlandichard Wieting, MD   modafinil (PROVIGIL) 100 MG tablet Take 1 tablet (100 mg total) by mouth daily. 08/13/16   Auburn BilberryShreyang Patel, MD  Multiple Vitamin (MULTIVITAMIN WITH MINERALS) TABS tablet Take 1 tablet by mouth daily. 08/04/15   Ramonita LabAruna Gouru, MD  nadolol (CORGARD) 20 MG tablet Take 20 mg by mouth daily.    Historical Provider, MD  OLANZapine (ZYPREXA) 15 MG tablet Take 2 tablets (30 mg total) by mouth at bedtime. 05/22/16   Audery AmelJohn T [name], MD  omeprazole (PRILOSEC) 40 MG capsule Take 1 capsule (40 mg total) by mouth every morning. 02/12/16   Enid Baasadhika Kalisetti, MD  ondansetron (ZOFRAN) 4 MG tablet Take 1 tablet (4 mg total) by mouth every 8 (eight) hours as needed for nausea or vomiting. 08/26/16   Governor Rooksebecca Lord, MD  rifaximin (XIFAXAN) 550 MG TABS tablet Take 1 tablet (550 mg total) by mouth 2 (two) times daily. 07/09/16   Katharina Caperima Vaickute, MD  spironolactone (ALDACTONE) 50 MG tablet Take 1 tablet (50 mg total) by mouth 2 (two) times daily. 07/09/16   Katharina Caperima Vaickute, MD  sucralfate (CARAFATE) 1 g tablet Take 1 g by mouth 4 (four) times daily.    Historical Provider, MD    REVIEW OF SYSTEMS:  Review of Systems  Constitutional: Negative for chills, fever, malaise/fatigue and weight loss.  HENT: Negative for ear pain, hearing loss and tinnitus.   Eyes: Negative for blurred vision, double vision, pain and redness.  Respiratory: Negative for cough, hemoptysis and shortness of breath.   Cardiovascular: Negative for chest pain, palpitations, orthopnea and leg swelling.  Gastrointestinal: Negative for abdominal pain, constipation, diarrhea, nausea and vomiting.  Genitourinary: Negative for dysuria, frequency and hematuria.  Musculoskeletal: Positive for falls. Negative for back pain, joint pain and neck pain.  Skin:       No acne, rash, or lesions  Neurological: Negative for dizziness, tremors, focal weakness and weakness.       Confusion  Endo/Heme/Allergies: Negative for polydipsia. Does not bruise/bleed easily.   Psychiatric/Behavioral: Negative for depression. The patient is not nervous/anxious and does not have insomnia.      VITAL SIGNS:   Vitals:   09/21/16 2147 09/21/16 2200 09/21/16 2230 09/21/16 2330  BP: 128/78  132/62 133/83  Pulse: 95  85 93  Resp:   (!) 21 18  Temp: 98.3 F (36.8 C)     TempSrc: Oral     SpO2: 94%  96% 96%  Weight:  (!) 158.8 kg (350 lb)    Height:  6\' 3"  (1.905 m)     Wt Readings from Last 3 Encounters:  09/21/16 (!) 158.8 kg (350 lb)  08/26/16 (!) 158.8 kg (350 lb)  08/07/16 (!) 162.4 kg (358 lb)    PHYSICAL EXAMINATION:  Physical Exam  Vitals reviewed. Constitutional: He is oriented to person, place, and time. He appears well-developed and well-nourished. No distress.  HENT:  Head: Normocephalic and atraumatic.  Mouth/Throat: Oropharynx is clear and moist.  Eyes: Conjunctivae and EOM are normal. Pupils are equal, round, and reactive to light. No scleral icterus.  Neck: Normal range of motion. Neck supple. No JVD present. No thyromegaly present.  Cardiovascular: Normal rate, regular rhythm and intact distal pulses.  Exam reveals no gallop and no friction rub.   No murmur heard. Respiratory: Effort normal and breath sounds normal. No respiratory distress. He has no wheezes. He has no rales.  GI: Soft. Bowel sounds are normal. He exhibits no distension. There is no tenderness.  Musculoskeletal: Normal range of motion. He exhibits no edema.  No arthritis, no gout  Lymphadenopathy:    He has no cervical adenopathy.  Neurological: He is alert and oriented to person, place, and time. No cranial nerve deficit.  Positive for asterixis. No dysarthria, no aphasia  Skin: Skin is warm and dry. No rash noted. No erythema.  Psychiatric: He has a normal mood and affect. His behavior is normal. Judgment and thought content normal.    LABORATORY PANEL:   CBC  Recent Labs Lab 09/21/16 2145  WBC 6.8  HGB 8.2*  HCT 26.8*  PLT 134*    ------------------------------------------------------------------------------------------------------------------  Chemistries   Recent Labs Lab 09/21/16 2145  NA 139  K 4.9  CL 99*  CO2 33*  GLUCOSE 175*  BUN 14  CREATININE 0.89  CALCIUM 8.7*  AST 46*  ALT 28  ALKPHOS 128*  BILITOT 0.4   ------------------------------------------------------------------------------------------------------------------  Cardiac Enzymes  Recent Labs Lab 09/21/16 2145  TROPONINI <0.03   ------------------------------------------------------------------------------------------------------------------  RADIOLOGY:  Dg Chest 2 View  Result Date: 09/22/2016 CLINICAL DATA:  Weakness today, trip and fall. History of hypertension, diabetes, cirrhosis. EXAM: CHEST  2 VIEW COMPARISON:  Chest radiograph June 09, 2016 FINDINGS: Cardiomediastinal silhouette is normal. No pleural effusions or focal consolidations. Mild bronchitic changes. Unchanged linear densities LEFT lung base. Trachea projects midline and there is no pneumothorax. Soft tissue planes and included osseous structures are non-suspicious. Large body habitus. Mild chronic approximate T12 compression fracture. IMPRESSION: Mild bronchitic changes.  LEFT lung base scarring. Electronically Signed   By: Awilda Metro M.D.   On: 09/22/2016 00:06   Ct Head Wo Contrast  Result Date: 09/22/2016 CLINICAL DATA:  Ground level fall, tripping over a door threshold tonight. EXAM: CT HEAD WITHOUT CONTRAST CT CERVICAL SPINE WITHOUT CONTRAST TECHNIQUE: Multidetector CT imaging of the head and cervical spine was performed following the standard protocol without intravenous contrast. Multiplanar CT image reconstructions of the cervical spine were also generated. COMPARISON:  None. FINDINGS: CT HEAD FINDINGS Brain: There is no intracranial hemorrhage, mass or evidence of acute infarction. There is no extra-axial fluid collection. Gray matter and white  matter appear normal. Cerebral volume is normal for age. Brainstem and posterior fossa are unremarkable. The CSF spaces appear normal. Vascular: No hyperdense vessel or unexpected calcification. Skull: Normal. Negative for fracture or focal lesion. Sinuses/Orbits: No acute finding. Other: None. CT CERVICAL SPINE FINDINGS Alignment: Normal. Skull base and vertebrae: No acute fracture. No primary bone lesion or focal pathologic process. Soft tissues and spinal canal: No prevertebral fluid or swelling. No visible canal hematoma. Disc levels: Good preservation of intervertebral disc spaces. Facet articulations are intact and well preserved. Upper chest: Negative. Other: Mildly motion degraded images. IMPRESSION: 1. Normal brain 2. Negative for acute cervical spine fracture. Mild motion degradation. Electronically Signed   By: Ellery Plunk M.D.   On: 09/22/2016 00:05   Ct Cervical  Spine Wo Contrast  Result Date: 09/22/2016 CLINICAL DATA:  Ground level fall, tripping over a door threshold tonight. EXAM: CT HEAD WITHOUT CONTRAST CT CERVICAL SPINE WITHOUT CONTRAST TECHNIQUE: Multidetector CT imaging of the head and cervical spine was performed following the standard protocol without intravenous contrast. Multiplanar CT image reconstructions of the cervical spine were also generated. COMPARISON:  None. FINDINGS: CT HEAD FINDINGS Brain: There is no intracranial hemorrhage, mass or evidence of acute infarction. There is no extra-axial fluid collection. Gray matter and white matter appear normal. Cerebral volume is normal for age. Brainstem and posterior fossa are unremarkable. The CSF spaces appear normal. Vascular: No hyperdense vessel or unexpected calcification. Skull: Normal. Negative for fracture or focal lesion. Sinuses/Orbits: No acute finding. Other: None. CT CERVICAL SPINE FINDINGS Alignment: Normal. Skull base and vertebrae: No acute fracture. No primary bone lesion or focal pathologic process. Soft tissues  and spinal canal: No prevertebral fluid or swelling. No visible canal hematoma. Disc levels: Good preservation of intervertebral disc spaces. Facet articulations are intact and well preserved. Upper chest: Negative. Other: Mildly motion degraded images. IMPRESSION: 1. Normal brain 2. Negative for acute cervical spine fracture. Mild motion degradation. Electronically Signed   By: Ellery Plunk M.D.   On: 09/22/2016 00:05    EKG:   Orders placed or performed during the hospital encounter of 09/21/16  . ED EKG  . ED EKG  . EKG 12-Lead  . EKG 12-Lead    IMPRESSION AND PLAN:  Principal Problem:   Hepatic encephalopathy (HCC) - with elevated ammonia level. We will avoid hepatotoxins, and increase his dose of lactulose to 3 times a day to start with recheck of his ammonia later in the afternoon. He may need further increase his lactulose to 4 times a day if his ammonia level was not coming down quickly enough. Active Problems:   Alcohol abuse - counseled the patient about the need to refrain from any alcohol use, CIWA protocol in place   Paranoid schizophrenia (HCC) - continue home meds   Type 2 diabetes mellitus (HCC) - sliding scale insulin with corresponding glucose checks   Alcoholic cirrhosis (HCC) - avoid hepatotoxins as above   Essential (primary) hypertension - currently normotensive, hold antihypertensives for now until his blood pressure increases  All the records are reviewed and case discussed with ED provider. Management plans discussed with the patient and/or family.  DVT PROPHYLAXIS: SubQ lovenox  GI PROPHYLAXIS: PPI  ADMISSION STATUS: Observation  CODE STATUS: Full Code Status History    Date Active Date Inactive Code Status Order ID Comments User Context   08/07/2016  7:37 PM 08/12/2016  8:51 PM Full Code [id number]  Gracelyn Nurse, MD Inpatient   07/07/2016  1:40 AM 07/09/2016  6:29 PM Full Code [id number]  Ihor Austin, MD Inpatient   06/10/2016  6:21 AM 06/11/2016   8:25 PM Full Code [id number]  Ihor Austin, MD Inpatient   05/12/2016  3:55 AM 05/15/2016  2:57 PM Full Code [id number]  Ihor Austin, MD Inpatient   04/28/2016  2:54 AM 04/29/2016  6:23 PM Full Code [id number]  Tonye Royalty, DO Inpatient   03/14/2016 12:30 PM 03/16/2016  3:41 PM Full Code [id number]  Shaune Pollack, MD Inpatient   03/03/2016 12:13 PM 03/06/2016  2:26 PM Full Code [id number]  Milagros Loll, MD ED   02/10/2016  5:54 PM 02/12/2016  7:34 PM Full Code [id number]  Ramonita Lab, MD Inpatient   11/11/2015  5:16 AM 11/13/2015  6:00 PM Full Code [id number]  Ihor Austin, MD Inpatient   11/07/2015 10:02 PM 11/09/2015  4:56 PM Full Code [id number]  Enid Baas, MD Inpatient   10/28/2015  8:14 PM 10/29/2015  2:26 PM Full Code [id number]  Altamese Dilling, MD Inpatient   10/16/2015 11:47 PM 10/17/2015  6:00 PM Full Code [id number]  Oralia Manis, MD Inpatient   07/30/2015  5:35 PM 08/04/2015  8:34 PM Full Code [id number]  Enedina Finner, MD Inpatient   07/05/2015  1:16 AM 07/08/2015  7:22 PM Full Code [id number]  Oralia Manis, MD Inpatient   05/30/2015  7:28 PM 06/01/2015  3:36 PM Full Code [id number]  Audery Amel, MD Inpatient      TOTAL TIME TAKING CARE OF THIS PATIENT: 40 minutes.    Howard [name] FIELDING 09/22/2016, 12:48 AM  [facility]  Office  [phone]  CC: Primary care physician; Marisue Ivan, MD

## 2016-09-22 NOTE — Evaluation (Signed)
Physical Therapy Evaluation Patient Details Name: Howard LinemanJoey A [name] MRN: [id number] DOB: 09/14/1981 Today's Date: 09/22/2016   History of Present Illness  Patient is a 36 y/o male with history of alcoholic cirrhosis, noted to be falling at home. He was found to have elevated ammonia levels, hepatic encephalopathy.   Clinical Impression  Patient evaluated after being noted to have multiple falls at home. Presently he is independent with bed mobility and only supervision assistance for transfers and ambulation. He is noted to have lateral sway with head movements, but no overt loss of balance. From his description the falls are mechanical and involve him tripping over something. He is noted to have an alcohol abuse disorder as well, which can certainly factor into falls at home. Given his modified DGI score of 9/12 and intermittent use of a quad cane, it would be reasonable to have a HHPT evaluation for home set up safety.     Follow Up Recommendations Home health PT    Equipment Recommendations       Recommendations for Other Services       Precautions / Restrictions Precautions Precautions: Fall Restrictions Weight Bearing Restrictions: No      Mobility  Bed Mobility Overal bed mobility: Independent             General bed mobility comments: No deficits identified with bed mobility.   Transfers Overall transfer level: Needs assistance Equipment used: None Transfers: Sit to/from Stand Sit to Stand: Supervision         General transfer comment: No loss of balance, mild delay in sit to stand, but no weight shifting noted.   Ambulation/Gait Ambulation/Gait assistance: Supervision Ambulation Distance (Feet): 250 Feet   Gait Pattern/deviations: Step-through pattern;Drifts right/left   Gait velocity interpretation: <1.8 ft/sec, indicative of risk for recurrent falls General Gait Details: Patient noted to drift left/right depending on stance leg, no overt loss of balance.    Stairs            Wheelchair Mobility    Modified Rankin (Stroke Patients Only)       Balance Overall balance assessment: Needs assistance Sitting-balance support: No upper extremity supported;Feet supported Sitting balance-Leahy Scale: Normal     Standing balance support: No upper extremity supported Standing balance-Leahy Scale: Fair Standing balance comment: Modified DFI of 9/12                             Pertinent Vitals/Pain Pain Assessment: No/denies pain    Home Living Family/patient expects to be discharged to:: Private residence Living Arrangements: Parent;Other relatives;Other (Comment) Available Help at Discharge: Family;Available 24 hours/day Type of Home: House Home Access: Stairs to enter Entrance Stairs-Rails: Right;Left;Can reach both Entrance Stairs-Number of Steps: 6 Home Layout: One level Home Equipment: Walker - 2 wheels;Cane - quad;Wheelchair - power      Prior Function Level of Independence: Needs assistance   Gait / Transfers Assistance Needed: Pt uses QC most of the time in house and power wheelchair when out in the community  ADL's / Homemaking Assistance Needed: Independent with ADLs, assist from family with IADLs  Comments: Pt uses QC most of the time in the house, uses w/c outside of home - apparently OOH almost daily?  -- per old note     Hand Dominance   Dominant Hand: Right    Extremity/Trunk Assessment   Upper Extremity Assessment Upper Extremity Assessment: Overall WFL for tasks assessed    Lower Extremity  Assessment Lower Extremity Assessment: Overall WFL for tasks assessed       Communication   Communication: No difficulties  Cognition Arousal/Alertness: Awake/alert Behavior During Therapy: WFL for tasks assessed/performed Overall Cognitive Status: History of cognitive impairments - at baseline                      General Comments      Exercises     Assessment/Plan    PT  Assessment Patient needs continued PT services  PT Problem List Decreased strength;Decreased balance          PT Treatment Interventions DME instruction;Gait training;Stair training;Therapeutic exercise;Therapeutic activities;Balance training;Functional mobility training;Neuromuscular re-education    PT Goals (Current goals can be found in the Care Plan section)  Acute Rehab PT Goals Patient Stated Goal: to return home  PT Goal Formulation: With patient Time For Goal Achievement: 10/06/16 Potential to Achieve Goals: Good    Frequency Min 2X/week   Barriers to discharge        Co-evaluation               End of Session Equipment Utilized During Treatment: Gait belt Activity Tolerance: Patient tolerated treatment well Patient left: in bed;with nursing/sitter in room Nurse Communication: Mobility status    Functional Assessment Tool Used: Clinical judgement  Functional Limitation: Mobility: Walking and moving around Mobility: Walking and Moving Around Current Status (Z6109): At least 1 percent but less than 20 percent impaired, limited or restricted Mobility: Walking and Moving Around Goal Status [phone]): At least 1 percent but less than 20 percent impaired, limited or restricted    Time: 1152-1202 PT Time Calculation (min) (ACUTE ONLY): 10 min   Charges:   PT Evaluation $PT Eval Low Complexity: 1 Procedure     PT G Codes:   PT G-Codes **NOT FOR INPATIENT CLASS** Functional Assessment Tool Used: Clinical judgement  Functional Limitation: Mobility: Walking and moving around Mobility: Walking and Moving Around Current Status (U9811): At least 1 percent but less than 20 percent impaired, limited or restricted Mobility: Walking and Moving Around Goal Status [phone]): At least 1 percent but less than 20 percent impaired, limited or restricted   Kerin Ransom, PT, DPT    09/22/2016, 4:45 PM

## 2016-09-22 NOTE — Progress Notes (Signed)
Lovenox changed to 40 mg BID for BMI >40 and CrCl >30. 

## 2016-09-23 ENCOUNTER — Observation Stay: Payer: Medicare Other

## 2016-09-23 DIAGNOSIS — E7220 Disorder of urea cycle metabolism, unspecified: Secondary | ICD-10-CM | POA: Diagnosis present

## 2016-09-23 DIAGNOSIS — K7290 Hepatic failure, unspecified without coma: Secondary | ICD-10-CM | POA: Diagnosis present

## 2016-09-23 DIAGNOSIS — E119 Type 2 diabetes mellitus without complications: Secondary | ICD-10-CM | POA: Diagnosis present

## 2016-09-23 DIAGNOSIS — R531 Weakness: Secondary | ICD-10-CM | POA: Diagnosis present

## 2016-09-23 DIAGNOSIS — E785 Hyperlipidemia, unspecified: Secondary | ICD-10-CM | POA: Diagnosis present

## 2016-09-23 DIAGNOSIS — F329 Major depressive disorder, single episode, unspecified: Secondary | ICD-10-CM | POA: Diagnosis present

## 2016-09-23 DIAGNOSIS — Z7984 Long term (current) use of oral hypoglycemic drugs: Secondary | ICD-10-CM | POA: Diagnosis not present

## 2016-09-23 DIAGNOSIS — Y92008 Other place in unspecified non-institutional (private) residence as the place of occurrence of the external cause: Secondary | ICD-10-CM | POA: Diagnosis not present

## 2016-09-23 DIAGNOSIS — Z823 Family history of stroke: Secondary | ICD-10-CM | POA: Diagnosis not present

## 2016-09-23 DIAGNOSIS — J449 Chronic obstructive pulmonary disease, unspecified: Secondary | ICD-10-CM | POA: Diagnosis present

## 2016-09-23 DIAGNOSIS — Z811 Family history of alcohol abuse and dependence: Secondary | ICD-10-CM | POA: Diagnosis not present

## 2016-09-23 DIAGNOSIS — Z9981 Dependence on supplemental oxygen: Secondary | ICD-10-CM | POA: Diagnosis not present

## 2016-09-23 DIAGNOSIS — F200 Paranoid schizophrenia: Secondary | ICD-10-CM | POA: Diagnosis present

## 2016-09-23 DIAGNOSIS — K766 Portal hypertension: Secondary | ICD-10-CM | POA: Diagnosis present

## 2016-09-23 DIAGNOSIS — W010XXA Fall on same level from slipping, tripping and stumbling without subsequent striking against object, initial encounter: Secondary | ICD-10-CM | POA: Diagnosis present

## 2016-09-23 DIAGNOSIS — Z8249 Family history of ischemic heart disease and other diseases of the circulatory system: Secondary | ICD-10-CM | POA: Diagnosis not present

## 2016-09-23 DIAGNOSIS — K7031 Alcoholic cirrhosis of liver with ascites: Secondary | ICD-10-CM | POA: Diagnosis present

## 2016-09-23 DIAGNOSIS — K3189 Other diseases of stomach and duodenum: Secondary | ICD-10-CM | POA: Diagnosis present

## 2016-09-23 DIAGNOSIS — I10 Essential (primary) hypertension: Secondary | ICD-10-CM | POA: Diagnosis present

## 2016-09-23 DIAGNOSIS — Z79899 Other long term (current) drug therapy: Secondary | ICD-10-CM | POA: Diagnosis not present

## 2016-09-23 DIAGNOSIS — F1010 Alcohol abuse, uncomplicated: Secondary | ICD-10-CM | POA: Diagnosis present

## 2016-09-23 LAB — CBC
HEMATOCRIT: 26.9 % — AB (ref 40.0–52.0)
HEMOGLOBIN: 8.1 g/dL — AB (ref 13.0–18.0)
MCH: 23.2 pg — AB (ref 26.0–34.0)
MCHC: 30 g/dL — AB (ref 32.0–36.0)
MCV: 77.3 fL — ABNORMAL LOW (ref 80.0–100.0)
Platelets: 106 10*3/uL — ABNORMAL LOW (ref 150–440)
RBC: 3.48 MIL/uL — ABNORMAL LOW (ref 4.40–5.90)
RDW: 18.2 % — ABNORMAL HIGH (ref 11.5–14.5)
WBC: 5.8 10*3/uL (ref 3.8–10.6)

## 2016-09-23 LAB — GLUCOSE, CAPILLARY
Glucose-Capillary: 110 mg/dL — ABNORMAL HIGH (ref 65–99)
Glucose-Capillary: 137 mg/dL — ABNORMAL HIGH (ref 65–99)

## 2016-09-23 LAB — AMMONIA: Ammonia: 47 umol/L — ABNORMAL HIGH (ref 9–35)

## 2016-09-23 NOTE — Progress Notes (Signed)
[facility] - [place] at [facility]   PATIENT NAME: Howard [name]    MR#:  [id number]  DATE OF BIRTH:  1980/12/24  SUBJECTIVE: Admitted for hepatic encephalopathy, started on lactulose higher dose, is better. ULTRA sound abdomen ordered to evaluate for ascites and possible therapeutic paracentesis.   CHIEF COMPLAINT:   Chief Complaint  Patient presents with  . Fall  . Weakness    REVIEW OF SYSTEMS:   ROS CONSTITUTIONAL: No fever, fatigue or weakness.  EYES: No blurred or double vision.  EARS, NOSE, AND THROAT: No tinnitus or ear pain.  RESPIRATORY: No cough, shortness of breath, wheezing or hemoptysis.  CARDIOVASCULAR: No chest pain, orthopnea, edema.  GASTROINTESTINAL: No nausea, vomiting, diarrhea or abdominal pain.  GENITOURINARY: No dysuria, hematuria.  ENDOCRINE: No polyuria, nocturia,  HEMATOLOGY: No anemia, easy bruising or bleeding SKIN: No rash or lesion. MUSCULOSKELETAL: No joint pain or arthritis.   NEUROLOGIC: No tingling, numbness, weakness.  PSYCHIATRY: No anxiety or depression.   DRUG ALLERGIES:  No Known Allergies  VITALS:  Blood pressure 120/62, pulse 89, temperature 98 F (36.7 C), temperature source Oral, resp. rate (!) 22, height 6\' 3"  (1.905 m), weight (!) 158.8 kg (350 lb), SpO2 93 %.  PHYSICAL EXAMINATION:  GENERAL:  36 y.o.-year-old patient lying in the bed with no acute distress.  EYES: Pupils equal, round, reactive to light and accommodation. No scleral icterus. Extraocular muscles intact.  HEENT: Head atraumatic, normocephalic. Oropharynx and nasopharynx clear.  NECK:  Supple, no jugular venous distention. No thyroid enlargement, no tenderness.  LUNGS: Normal breath sounds bilaterally, no wheezing, rales,rhonchi or crepitation. No use of accessory muscles of respiration.  CARDIOVASCULAR: S1, S2 normal. No murmurs, rubs, or gallops.  ABDOMEN: DISTENEDED  with ascites, fluid thrill present  EXTREMITIES: No pedal edema,  cyanosis, or clubbing.  NEUROLOGIC: Cranial nerves II through XII are intact. Muscle strength 5/5 in all extremities. Sensation intact. Gait not checked.  PSYCHIATRIC: The patient is alert and oriented x 3.  SKIN: No obvious rash, lesion, or ulcer.    LABORATORY PANEL:   CBC  Recent Labs Lab 09/23/16 0724  WBC 5.8  HGB 8.1*  HCT 26.9*  PLT 106*   ------------------------------------------------------------------------------------------------------------------  Chemistries   Recent Labs Lab 09/21/16 2145 09/22/16 0458  NA 139 141  K 4.9 4.5  CL 99* 100*  CO2 33* 36*  GLUCOSE 175* 79  BUN 14 15  CREATININE 0.89 0.86  CALCIUM 8.7* 8.9  AST 46*  --   ALT 28  --   ALKPHOS 128*  --   BILITOT 0.4  --    ------------------------------------------------------------------------------------------------------------------  Cardiac Enzymes  Recent Labs Lab 09/21/16 2145  TROPONINI <0.03   ------------------------------------------------------------------------------------------------------------------  RADIOLOGY:  Dg Chest 2 View  Result Date: 09/22/2016 CLINICAL DATA:  Weakness today, trip and fall. History of hypertension, diabetes, cirrhosis. EXAM: CHEST  2 VIEW COMPARISON:  Chest radiograph June 09, 2016 FINDINGS: Cardiomediastinal silhouette is normal. No pleural effusions or focal consolidations. Mild bronchitic changes. Unchanged linear densities LEFT lung base. Trachea projects midline and there is no pneumothorax. Soft tissue planes and included osseous structures are non-suspicious. Large body habitus. Mild chronic approximate T12 compression fracture. IMPRESSION: Mild bronchitic changes.  LEFT lung base scarring. Electronically Signed   By: Awilda Metro M.D.   On: 09/22/2016 00:06   Ct Head Wo Contrast  Result Date: 09/22/2016 CLINICAL DATA:  Ground level fall, tripping over a door threshold tonight. EXAM: CT HEAD WITHOUT CONTRAST CT CERVICAL SPINE  WITHOUT CONTRAST TECHNIQUE: Multidetector CT imaging of the head and cervical spine was performed following the standard protocol without intravenous contrast. Multiplanar CT image reconstructions of the cervical spine were also generated. COMPARISON:  None. FINDINGS: CT HEAD FINDINGS Brain: There is no intracranial hemorrhage, mass or evidence of acute infarction. There is no extra-axial fluid collection. Gray matter and white matter appear normal. Cerebral volume is normal for age. Brainstem and posterior fossa are unremarkable. The CSF spaces appear normal. Vascular: No hyperdense vessel or unexpected calcification. Skull: Normal. Negative for fracture or focal lesion. Sinuses/Orbits: No acute finding. Other: None. CT CERVICAL SPINE FINDINGS Alignment: Normal. Skull base and vertebrae: No acute fracture. No primary bone lesion or focal pathologic process. Soft tissues and spinal canal: No prevertebral fluid or swelling. No visible canal hematoma. Disc levels: Good preservation of intervertebral disc spaces. Facet articulations are intact and well preserved. Upper chest: Negative. Other: Mildly motion degraded images. IMPRESSION: 1. Normal brain 2. Negative for acute cervical spine fracture. Mild motion degradation. Electronically Signed   By: Ellery Plunkaniel R Mitchell M.D.   On: 09/22/2016 00:05   Ct Cervical Spine Wo Contrast  Result Date: 09/22/2016 CLINICAL DATA:  Ground level fall, tripping over a door threshold tonight. EXAM: CT HEAD WITHOUT CONTRAST CT CERVICAL SPINE WITHOUT CONTRAST TECHNIQUE: Multidetector CT imaging of the head and cervical spine was performed following the standard protocol without intravenous contrast. Multiplanar CT image reconstructions of the cervical spine were also generated. COMPARISON:  None. FINDINGS: CT HEAD FINDINGS Brain: There is no intracranial hemorrhage, mass or evidence of acute infarction. There is no extra-axial fluid collection. Gray matter and white matter appear normal.  Cerebral volume is normal for age. Brainstem and posterior fossa are unremarkable. The CSF spaces appear normal. Vascular: No hyperdense vessel or unexpected calcification. Skull: Normal. Negative for fracture or focal lesion. Sinuses/Orbits: No acute finding. Other: None. CT CERVICAL SPINE FINDINGS Alignment: Normal. Skull base and vertebrae: No acute fracture. No primary bone lesion or focal pathologic process. Soft tissues and spinal canal: No prevertebral fluid or swelling. No visible canal hematoma. Disc levels: Good preservation of intervertebral disc spaces. Facet articulations are intact and well preserved. Upper chest: Negative. Other: Mildly motion degraded images. IMPRESSION: 1. Normal brain 2. Negative for acute cervical spine fracture. Mild motion degradation. Electronically Signed   By: Ellery Plunkaniel R Mitchell M.D.   On: 09/22/2016 00:05    EKG:   Orders placed or performed during the hospital encounter of 09/21/16  . EKG 12-Lead  . 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 esophageal varices #5 .hypertension #6 .hyperlipidemia #7 .abdominal ultrasound ordered today to evaluate for need for the therapeutic paracentesis if ultrasound the doesn't show much fluid patient to discharge home.    All the records are reviewed and case discussed with Care Management/Social Workerr. Management plans discussed with the patient, family and they are in agreement.  CODE STATUS: FULL  TOTAL TIME TAKING CARE OF THIS PATIENT: 35 minutes.   POSSIBLE D/C IN 1-2DAYS, DEPENDING ON CLINICAL CONDITION.   Katha HammingKONIDENA,Trystan Eads M.D on 09/23/2016 at 10:04 AM  Between 7am to 6pm - Pager - [phone]  After 6pm go to [email or web address] - password [email or web address] [facility]  [facility] [place] [facility]  Office  [phone]  CC: Primary care  physician; Marisue IvanLINTHAVONG, [name], MD   Note: This dictation was prepared with Dragon dictation  along with smaller phrase technology. Any transcriptional errors that result from this process are unintentional.

## 2016-09-23 NOTE — Progress Notes (Signed)
Discharge instructions given and went over with patient and mother at bedside. All questions answered. Patient discharged home with mother via wheelchair by nursing staff. Bo McclintockBrewer,Aanyah Loa S, RN

## 2016-09-23 NOTE — Progress Notes (Signed)
MD gave writer verbal orders to place discharge orders to home. Bo McclintockBrewer,Essex Perry S, RN

## 2016-09-23 NOTE — Care Management (Signed)
Admitted to [facility] with the diagnosis of hepatic encephalopathy. Discharged from this facility 08/12/16. Lives with mother Jamesetta GeraldsBetty Folson [phone]). Sees Dr. Burnadette PopLinthavong as primary care physician. Chronic home oxygen per [facility]. Nursing services per [facility] in the home. Rolling walker, cane, and electric wheelchair in the home. Palliative Care consult last admission.  Physical therapy evaluation completed. Recommends home with home health/physical. Will ask for resumption of services per [facility]. [facility] representative updated. Discharge to home today per Dr. Teressa SenterKonidenia.  Gwenette GreetBrenda S Piotr Christopher RN MSN CCM Care Management

## 2016-09-26 NOTE — Discharge Summary (Signed)
Howard [name], is a 36 y.o. male  DOB 07-Apr-1981  MRN [id number].  Admission date:  09/21/2016  Admitting Physician  Oralia Manis, MD  Discharge Date:  09/26/2016   Primary MD  Marisue Ivan, MD  Recommendations for primary care physician for things to follow:   Follow-up with primary doctor in 1 week   Admission Diagnosis  Hepatic encephalopathy (HCC) [K72.90] Hyperammonemia (HCC) [E72.20] Weakness [R53.1]   Discharge Diagnosis  Hepatic encephalopathy (HCC) [K72.90] Hyperammonemia (HCC) [E72.20] Weakness [R53.1]   Principal Problem:   Hepatic encephalopathy (HCC) Active Problems:   Paranoid schizophrenia (HCC)   Alcohol abuse   Type 2 diabetes mellitus (HCC)   Alcoholic cirrhosis (HCC)   Essential (primary) hypertension      Past Medical History:  Diagnosis Date  . Alcoholic cirrhosis of liver with ascites (HCC)   . Anemia   . COPD (chronic obstructive pulmonary disease) (HCC)   . Depression   . Diabetes (HCC)   . Diabetes mellitus, type II (HCC)   . Esophageal varices (HCC)   . Heart disease   . Hyperlipemia   . Hypertension   . Liver disease   . Multiple thyroid nodules   . Portal hypertensive gastropathy   . Schizophrenia [facility])     Past Surgical History:  Procedure Laterality Date  . ESOPHAGOGASTRODUODENOSCOPY N/A 10/05/2015   Procedure: ESOPHAGOGASTRODUODENOSCOPY (EGD);  Surgeon: Elnita Maxwell, MD;  Location: [facility] ENDOSCOPY;  Service: Endoscopy;  Laterality: N/A;  . ESOPHAGOGASTRODUODENOSCOPY (EGD) WITH PROPOFOL N/A 08/03/2015   Procedure: ESOPHAGOGASTRODUODENOSCOPY (EGD) WITH PROPOFOL;  Surgeon: Elnita Maxwell, MD;  Location: [facility] ENDOSCOPY;  Service: Endoscopy;  Laterality: N/A;  . ESOPHAGOGASTRODUODENOSCOPY (EGD) WITH PROPOFOL N/A 08/31/2015   Procedure:  ESOPHAGOGASTRODUODENOSCOPY (EGD) WITH PROPOFOL;  Surgeon: Elnita Maxwell, MD;  Location: [facility] ENDOSCOPY;  Service: Endoscopy;  Laterality: N/A;  . ESOPHAGOGASTRODUODENOSCOPY (EGD) WITH PROPOFOL N/A 04/04/2016   Procedure: ESOPHAGOGASTRODUODENOSCOPY (EGD) WITH PROPOFOL;  Surgeon: Scot Jun, MD;  Location: [facility] ENDOSCOPY;  Service: Endoscopy;  Laterality: N/A;  . NO PAST SURGERIES         History of present illness and  Hospital Course:     Kindly see H&P for history of present illness and admission details, please review complete Labs, Consult reports and Test reports for all details in brief  HPI  from the history and physical done on the day of admission  Howard [name]  is a 36 y.o. male who presents with Increased falls at home. Patient states that he has been feeling weak over the last few days and has fallen several times. He has a history of hepatic cirrhosis with prior episodes of hepatic encephalopathy. Here in the ED today his ammonia level is elevated at 125. He states his only been taking his lactulose once or twice a day. He also states that he drank some alcohol 2 days ago. Admitted for hepatic encephalopathy, falls.  Hospital Course  #1. Hepatic encephalopathy with elevated ammonia level: Patient lactulose increased to 45 mL 4 times a day,  and patient became more alert and awake. Ammonia level normalized. Patient advised to continue lactulose, rifaximin at discharge.  #2 alcohol liver cirrhosis advised to refrain from alcohol use. Did not require CIWA protocol during the admission.   #3 diabetes mellitus type 2: Continue sliding scale with coverage  #4. History of paranoid schizophrenia: Continue Keppra, and the pain.  5.Portal hypertensive gastropathy; continue on Adderall. #6 CIRRHOSIS, ascites: Abdominal ultrasound did not show any drainable fluid from  his  Abdomen #7/physical therapy recommended home health physical therapy. Chronic home oxygen per [facility]. Nursing services per [facility] in the home. Rolling walker, cane, and electric wheelchair in the home. Palliative Care consult last admission.  Physical therapy evaluation completed. Recommends home with home health/physical.     Discharge Condition: stable   Follow UP      Discharge Instructions  and  Discharge Medications      Allergies as of 09/23/2016   No Known Allergies     Medication List    TAKE these medications   acamprosate 333 MG tablet Commonly known as:  CAMPRAL Take 2 tablets (666 mg total) by mouth 3 (three) times daily with meals.   citalopram 20 MG tablet Commonly known as:  CELEXA Take 1 tablet (20 mg total) by mouth every morning.   furosemide 20 MG tablet Commonly known as:  LASIX Take 1 tablet (20 mg total) by mouth daily.   lactulose 10 GM/15ML solution Commonly known as:  CHRONULAC Take 45 mLs (30 g total) by mouth every 4 (four) hours.   levETIRAcetam 1000 MG tablet Commonly known as:  KEPPRA Take 1 tablet (1,000 mg total) by mouth 2 (two) times daily.   losartan 25 MG tablet Commonly known as:  COZAAR Take 25 mg by mouth daily.   metFORMIN 500 MG tablet Commonly known as:  GLUCOPHAGE Take 1 tablet (500 mg total) by mouth 2 (two) times daily with a meal.   modafinil 100 MG tablet Commonly known as:  PROVIGIL Take 1 tablet (100 mg total) by mouth daily.   multivitamin with minerals Tabs tablet Take 1 tablet by mouth daily.   nadolol 20 MG tablet Commonly known as:  CORGARD Take 20 mg by mouth daily.   OLANZapine 15 MG tablet Commonly known as:  ZYPREXA Take 2 tablets (30 mg total) by mouth at bedtime.   omeprazole 40 MG capsule Commonly known as:  PRILOSEC Take 1 capsule (40 mg total) by mouth every morning.   ondansetron 4 MG tablet Commonly known as:  ZOFRAN Take 1 tablet (4 mg total) by mouth every 8 (eight) hours as needed for nausea or vomiting.   rifaximin 550 MG Tabs tablet Commonly known as:   XIFAXAN Take 1 tablet (550 mg total) by mouth 2 (two) times daily.   spironolactone 50 MG tablet Commonly known as:  ALDACTONE Take 1 tablet (50 mg total) by mouth 2 (two) times daily.   sucralfate 1 g tablet Commonly known as:  CARAFATE Take 1 g by mouth 4 (four) times daily.         Diet and Activity recommendation: See Discharge Instructions above   Consults obtained - none   Major procedures and Radiology Reports - PLEASE review detailed and final reports for all details, in brief -      Dg Chest 2 View  Result Date: 09/22/2016 CLINICAL DATA:  Weakness today, trip and fall. History of hypertension, diabetes, cirrhosis. EXAM: CHEST  2 VIEW COMPARISON:  Chest radiograph June 09, 2016 FINDINGS: Cardiomediastinal silhouette is normal. No pleural effusions or focal consolidations. Mild bronchitic changes. Unchanged linear densities LEFT lung base. Trachea projects midline and there is no pneumothorax. Soft tissue planes and included osseous structures are non-suspicious. Large body habitus. Mild chronic approximate T12 compression fracture. IMPRESSION: Mild bronchitic changes.  LEFT lung base scarring. Electronically Signed   By: Awilda Metro M.D.   On: 09/22/2016 00:06   Ct Head Wo Contrast  Result Date: 09/22/2016 CLINICAL DATA:  Ground level fall, tripping over a door threshold tonight. EXAM: CT HEAD WITHOUT CONTRAST CT CERVICAL SPINE WITHOUT CONTRAST TECHNIQUE: Multidetector CT imaging of the head and cervical spine was performed following the standard protocol without intravenous contrast. Multiplanar CT image reconstructions of the cervical spine were also generated. COMPARISON:  None. FINDINGS: CT HEAD FINDINGS Brain: There is no intracranial hemorrhage, mass or evidence of acute infarction. There is no extra-axial fluid collection. Gray matter and white matter appear normal. Cerebral volume is normal for age. Brainstem and posterior fossa are unremarkable. The CSF  spaces appear normal. Vascular: No hyperdense vessel or unexpected calcification. Skull: Normal. Negative for fracture or focal lesion. Sinuses/Orbits: No acute finding. Other: None. CT CERVICAL SPINE FINDINGS Alignment: Normal. Skull base and vertebrae: No acute fracture. No primary bone lesion or focal pathologic process. Soft tissues and spinal canal: No prevertebral fluid or swelling. No visible canal hematoma. Disc levels: Good preservation of intervertebral disc spaces. Facet articulations are intact and well preserved. Upper chest: Negative. Other: Mildly motion degraded images. IMPRESSION: 1. Normal brain 2. Negative for acute cervical spine fracture. Mild motion degradation. Electronically Signed   By: Ellery Plunkaniel R Mitchell M.D.   On: 09/22/2016 00:05   Ct Cervical Spine Wo Contrast  Result Date: 09/22/2016 CLINICAL DATA:  Ground level fall, tripping over a door threshold tonight. EXAM: CT HEAD WITHOUT CONTRAST CT CERVICAL SPINE WITHOUT CONTRAST TECHNIQUE: Multidetector CT imaging of the head and cervical spine was performed following the standard protocol without intravenous contrast. Multiplanar CT image reconstructions of the cervical spine were also generated. COMPARISON:  None. FINDINGS: CT HEAD FINDINGS Brain: There is no intracranial hemorrhage, mass or evidence of acute infarction. There is no extra-axial fluid collection. Gray matter and white matter appear normal. Cerebral volume is normal for age. Brainstem and posterior fossa are unremarkable. The CSF spaces appear normal. Vascular: No hyperdense vessel or unexpected calcification. Skull: Normal. Negative for fracture or focal lesion. Sinuses/Orbits: No acute finding. Other: None. CT CERVICAL SPINE FINDINGS Alignment: Normal. Skull base and vertebrae: No acute fracture. No primary bone lesion or focal pathologic process. Soft tissues and spinal canal: No prevertebral fluid or swelling. No visible canal hematoma. Disc levels: Good preservation of  intervertebral disc spaces. Facet articulations are intact and well preserved. Upper chest: Negative. Other: Mildly motion degraded images. IMPRESSION: 1. Normal brain 2. Negative for acute cervical spine fracture. Mild motion degradation. Electronically Signed   By: Ellery Plunkaniel R Mitchell M.D.   On: 09/22/2016 00:05   [place] Abdomen Limited  Result Date: 09/23/2016 CLINICAL DATA:  Abdominal distension. EXAM: LIMITED ABDOMEN ULTRASOUND FOR ASCITES TECHNIQUE: Limited ultrasound survey for ascites was performed in all four abdominal quadrants. COMPARISON:  Ultrasound of May 12, 2016. FINDINGS: No definite fluid is noted in the 4 quadrants of the abdomen. IMPRESSION: No definite ascites is noted. Paracentesis was therefore not performed. Electronically Signed   By: Lupita RaiderJames  Green Jr, M.D.   On: 09/23/2016 12:03    Micro Results     No results found for this or any previous visit (from the past 240 hour(s)).     Today   Subjective:   Howard [name] today has no headache,no chest abdominal pain,no new weakness tingling or numbness, feels much better wants to go home today.   Objective:   Blood pressure 122/77, pulse 82, temperature 97.9 F (36.6 C), temperature source Oral, resp. rate 16, height 6\' 3"  (1.905 m), weight (!) 158.8 kg (350 lb), SpO2 95 %.  No intake or output data in the 24 hours ending 09/26/16 1321  Exam Awake Alert, Oriented x 3, No new F.N deficits, Normal affect [place].AT,PERRAL Supple Neck,No JVD, No cervical lymphadenopathy appriciated.  Symmetrical Chest wall movement, Good air movement bilaterally, CTAB RRR,No Gallops,Rubs or new Murmurs, No Parasternal Heave +ve B.Sounds, Abd Soft, Non tender, No organomegaly appriciated, No rebound -guarding or rigidity. No Cyanosis, Clubbing or edema, No new Rash or bruise  Data Review   CBC w Diff:  Lab Results  Component Value Date   WBC 5.8 09/23/2016   HGB 8.1 (L) 09/23/2016   HGB 14.4 12/25/2014   HCT 26.9 (L) 09/23/2016   HCT  44.7 12/25/2014   PLT 106 (L) 09/23/2016   PLT 155 12/25/2014   LYMPHOPCT 17 09/21/2016   MONOPCT 9 09/21/2016   EOSPCT 5 09/21/2016   BASOPCT 1 09/21/2016    CMP:  Lab Results  Component Value Date   NA 141 09/22/2016   NA 141 12/25/2014   K 4.5 09/22/2016   K 3.4 (L) 12/25/2014   CL 100 (L) 09/22/2016   CL 100 (L) 12/25/2014   CO2 36 (H) 09/22/2016   CO2 30 12/25/2014   BUN 15 09/22/2016   BUN 15 12/25/2014   CREATININE 0.86 09/22/2016   CREATININE 1.23 12/25/2014   PROT 7.8 09/21/2016   PROT 8.5 (H) 12/25/2014   ALBUMIN 3.5 09/21/2016   ALBUMIN 4.1 12/25/2014   BILITOT 0.4 09/21/2016   BILITOT 0.9 12/25/2014   ALKPHOS 128 (H) 09/21/2016   ALKPHOS 148 (H) 12/25/2014   AST 46 (H) 09/21/2016   AST 146 (H) 12/25/2014   ALT 28 09/21/2016   ALT 107 (H) 12/25/2014  .   Total Time in preparing paper work, data evaluation and todays exam - 35 minutes  Analiah Drum M.D on 09/23/2016 at 1:21 PM    Note: This dictation was prepared with Dragon dictation along with smaller phrase technology. Any transcriptional errors that result from this process are unintentional.

## 2016-10-04 ENCOUNTER — Emergency Department: Payer: Medicare Other

## 2016-10-04 ENCOUNTER — Encounter: Payer: Self-pay | Admitting: Emergency Medicine

## 2016-10-04 ENCOUNTER — Observation Stay: Payer: Medicare Other

## 2016-10-04 ENCOUNTER — Inpatient Hospital Stay
Admission: EM | Admit: 2016-10-04 | Discharge: 2016-10-08 | DRG: 205 | Disposition: A | Discharge status: Home / Self Care | Payer: Medicare Other | Attending: Internal Medicine | Admitting: Internal Medicine

## 2016-10-04 DIAGNOSIS — Z823 Family history of stroke: Secondary | ICD-10-CM

## 2016-10-04 DIAGNOSIS — R0689 Other abnormalities of breathing: Secondary | ICD-10-CM

## 2016-10-04 DIAGNOSIS — K766 Portal hypertension: Secondary | ICD-10-CM | POA: Diagnosis present

## 2016-10-04 DIAGNOSIS — F329 Major depressive disorder, single episode, unspecified: Secondary | ICD-10-CM | POA: Diagnosis present

## 2016-10-04 DIAGNOSIS — Z9114 Patient's other noncompliance with medication regimen: Secondary | ICD-10-CM

## 2016-10-04 DIAGNOSIS — Z8249 Family history of ischemic heart disease and other diseases of the circulatory system: Secondary | ICD-10-CM

## 2016-10-04 DIAGNOSIS — K7040 Alcoholic hepatic failure without coma: Secondary | ICD-10-CM | POA: Diagnosis present

## 2016-10-04 DIAGNOSIS — J449 Chronic obstructive pulmonary disease, unspecified: Secondary | ICD-10-CM | POA: Diagnosis present

## 2016-10-04 DIAGNOSIS — R2681 Unsteadiness on feet: Secondary | ICD-10-CM

## 2016-10-04 DIAGNOSIS — D509 Iron deficiency anemia, unspecified: Secondary | ICD-10-CM | POA: Diagnosis present

## 2016-10-04 DIAGNOSIS — F209 Schizophrenia, unspecified: Secondary | ICD-10-CM | POA: Diagnosis present

## 2016-10-04 DIAGNOSIS — Z811 Family history of alcohol abuse and dependence: Secondary | ICD-10-CM

## 2016-10-04 DIAGNOSIS — K7031 Alcoholic cirrhosis of liver with ascites: Secondary | ICD-10-CM | POA: Diagnosis present

## 2016-10-04 DIAGNOSIS — F1010 Alcohol abuse, uncomplicated: Secondary | ICD-10-CM | POA: Diagnosis present

## 2016-10-04 DIAGNOSIS — Z6841 Body Mass Index (BMI) 40.0 and over, adult: Secondary | ICD-10-CM

## 2016-10-04 DIAGNOSIS — E785 Hyperlipidemia, unspecified: Secondary | ICD-10-CM | POA: Diagnosis present

## 2016-10-04 DIAGNOSIS — J9622 Acute and chronic respiratory failure with hypercapnia: Secondary | ICD-10-CM | POA: Diagnosis present

## 2016-10-04 DIAGNOSIS — Z9119 Patient's noncompliance with other medical treatment and regimen: Secondary | ICD-10-CM

## 2016-10-04 DIAGNOSIS — K921 Melena: Secondary | ICD-10-CM | POA: Diagnosis present

## 2016-10-04 DIAGNOSIS — K3189 Other diseases of stomach and duodenum: Secondary | ICD-10-CM | POA: Diagnosis present

## 2016-10-04 DIAGNOSIS — R0902 Hypoxemia: Secondary | ICD-10-CM

## 2016-10-04 DIAGNOSIS — Z9981 Dependence on supplemental oxygen: Secondary | ICD-10-CM

## 2016-10-04 DIAGNOSIS — Y92009 Unspecified place in unspecified non-institutional (private) residence as the place of occurrence of the external cause: Secondary | ICD-10-CM

## 2016-10-04 DIAGNOSIS — R188 Other ascites: Secondary | ICD-10-CM

## 2016-10-04 DIAGNOSIS — R140 Abdominal distension (gaseous): Secondary | ICD-10-CM

## 2016-10-04 DIAGNOSIS — E119 Type 2 diabetes mellitus without complications: Secondary | ICD-10-CM | POA: Diagnosis present

## 2016-10-04 DIAGNOSIS — Z91128 Patient's intentional underdosing of medication regimen for other reason: Secondary | ICD-10-CM

## 2016-10-04 DIAGNOSIS — D638 Anemia in other chronic diseases classified elsewhere: Secondary | ICD-10-CM | POA: Diagnosis present

## 2016-10-04 DIAGNOSIS — I10 Essential (primary) hypertension: Secondary | ICD-10-CM | POA: Diagnosis present

## 2016-10-04 DIAGNOSIS — T473X6A Underdosing of saline and osmotic laxatives, initial encounter: Secondary | ICD-10-CM | POA: Diagnosis present

## 2016-10-04 DIAGNOSIS — E662 Morbid (severe) obesity with alveolar hypoventilation: Principal | ICD-10-CM | POA: Diagnosis present

## 2016-10-04 DIAGNOSIS — J9621 Acute and chronic respiratory failure with hypoxia: Secondary | ICD-10-CM | POA: Diagnosis present

## 2016-10-04 LAB — AMMONIA: AMMONIA: 111 umol/L — AB (ref 9–35)

## 2016-10-04 LAB — COMPREHENSIVE METABOLIC PANEL
ALT: 26 U/L (ref 17–63)
AST: 39 U/L (ref 15–41)
Albumin: 3.1 g/dL — ABNORMAL LOW (ref 3.5–5.0)
Alkaline Phosphatase: 123 U/L (ref 38–126)
Anion gap: 4 — ABNORMAL LOW (ref 5–15)
BUN: 14 mg/dL (ref 6–20)
CHLORIDE: 103 mmol/L (ref 101–111)
CO2: 33 mmol/L — AB (ref 22–32)
Calcium: 8.6 mg/dL — ABNORMAL LOW (ref 8.9–10.3)
Creatinine, Ser: 0.91 mg/dL (ref 0.61–1.24)
GFR calc non Af Amer: >60 mL/min (ref >60)
Glucose, Bld: 111 mg/dL — ABNORMAL HIGH (ref 65–99)
POTASSIUM: 4.7 mmol/L (ref 3.5–5.1)
SODIUM: 140 mmol/L (ref 135–145)
Total Bilirubin: 0.6 mg/dL (ref 0.3–1.2)
Total Protein: 7.5 g/dL (ref 6.5–8.1)

## 2016-10-04 LAB — CBC WITH DIFFERENTIAL/PLATELET
BASOS ABS: 0.1 10*3/uL (ref 0–0.1)
Basophils Relative: 1 %
EOS ABS: 0.2 10*3/uL (ref 0–0.7)
Eosinophils Relative: 3 %
HEMATOCRIT: 25.7 % — AB (ref 40.0–52.0)
Hemoglobin: 7.6 g/dL — ABNORMAL LOW (ref 13.0–18.0)
Lymphocytes Relative: 11 %
Lymphs Abs: 0.9 10*3/uL — ABNORMAL LOW (ref 1.0–3.6)
MCH: 22.6 pg — ABNORMAL LOW (ref 26.0–34.0)
MCHC: 29.5 g/dL — ABNORMAL LOW (ref 32.0–36.0)
MCV: 76.8 fL — ABNORMAL LOW (ref 80.0–100.0)
MONO ABS: 0.6 10*3/uL (ref 0.2–1.0)
Monocytes Relative: 7 %
NEUTROS ABS: 6.3 10*3/uL (ref 1.4–6.5)
NEUTROS PCT: 78 %
PLATELETS: 144 10*3/uL — AB (ref 150–440)
RBC: 3.35 MIL/uL — ABNORMAL LOW (ref 4.40–5.90)
RDW: 18.5 % — AB (ref 11.5–14.5)
WBC: 8 10*3/uL (ref 3.8–10.6)

## 2016-10-04 LAB — URINALYSIS, COMPLETE (UACMP) WITH MICROSCOPIC
BILIRUBIN URINE: NEGATIVE
Bacteria, UA: NONE SEEN
Glucose, UA: NEGATIVE mg/dL
Ketones, ur: NEGATIVE mg/dL
LEUKOCYTES UA: NEGATIVE
NITRITE: NEGATIVE
PH: 5 (ref 5.0–8.0)
Protein, ur: 30 mg/dL — AB
SPECIFIC GRAVITY, URINE: 1.018 (ref 1.005–1.030)
Squamous Epithelial / LPF: NONE SEEN

## 2016-10-04 LAB — GLUCOSE, CAPILLARY: GLUCOSE-CAPILLARY: 144 mg/dL — AB (ref 65–99)

## 2016-10-04 LAB — BRAIN NATRIURETIC PEPTIDE: B NATRIURETIC PEPTIDE 5: 53 pg/mL (ref 0.0–100.0)

## 2016-10-04 LAB — PROTIME-INR
INR: 1.03
PROTHROMBIN TIME: 13.5 s (ref 11.4–15.2)

## 2016-10-04 LAB — ETHANOL

## 2016-10-04 LAB — TROPONIN I: Troponin I: <0.03 ng/mL (ref <0.03)

## 2016-10-04 MED ORDER — ACAMPROSATE CALCIUM 333 MG PO TBEC
666.0000 mg | DELAYED_RELEASE_TABLET | Freq: Three times a day (TID) | ORAL | Status: DC
  Administered 2016-10-05 – 2016-10-08 (×9): 666 mg via ORAL
  Filled 2016-10-04 (×11): qty 2

## 2016-10-04 MED ORDER — SUCRALFATE 1 G PO TABS
1.0000 g | ORAL_TABLET | Freq: Four times a day (QID) | ORAL | Status: DC
  Administered 2016-10-04 – 2016-10-08 (×12): 1 g via ORAL
  Filled 2016-10-04 (×13): qty 1

## 2016-10-04 MED ORDER — LEVETIRACETAM 500 MG PO TABS: 1000.0000 mg | ORAL_TABLET | Freq: Two times a day (BID) | ORAL | Status: DC

## 2016-10-04 MED ORDER — LOSARTAN POTASSIUM 50 MG PO TABS
25.0000 mg | ORAL_TABLET | Freq: Every day | ORAL | Status: DC
  Administered 2016-10-04 – 2016-10-08 (×5): 25 mg via ORAL
  Filled 2016-10-04 (×5): qty 1

## 2016-10-04 MED ORDER — ADULT MULTIVITAMIN W/MINERALS CH
1.0000 | ORAL_TABLET | Freq: Every day | ORAL | Status: DC
  Administered 2016-10-04 – 2016-10-08 (×5): 1 via ORAL
  Filled 2016-10-04 (×5): qty 1

## 2016-10-04 MED ORDER — NADOLOL 20 MG PO TABS
20.0000 mg | ORAL_TABLET | Freq: Every day | ORAL | Status: DC
  Administered 2016-10-04 – 2016-10-08 (×5): 20 mg via ORAL
  Filled 2016-10-04 (×5): qty 1

## 2016-10-04 MED ORDER — ACETAMINOPHEN 650 MG RE SUPP: 650.0000 mg | Freq: Four times a day (QID) | RECTAL | Status: DC | PRN

## 2016-10-04 MED ORDER — CITALOPRAM HYDROBROMIDE 20 MG PO TABS
20.0000 mg | ORAL_TABLET | ORAL | Status: DC
  Administered 2016-10-05 – 2016-10-08 (×4): 20 mg via ORAL
  Filled 2016-10-04 (×4): qty 1

## 2016-10-04 MED ORDER — THIAMINE HCL 100 MG/ML IJ SOLN
100.0000 mg | Freq: Every day | INTRAMUSCULAR | Status: DC
  Administered 2016-10-05: 10:00:00 100 mg via INTRAVENOUS
  Filled 2016-10-04: qty 2

## 2016-10-04 MED ORDER — INSULIN ASPART 100 UNIT/ML ~~LOC~~ SOLN
0.0000 [IU] | Freq: Three times a day (TID) | SUBCUTANEOUS | Status: DC
Start: 2016-10-05 — End: 2016-10-08
  Administered 2016-10-05: 08:00:00 2 [IU] via SUBCUTANEOUS
  Administered 2016-10-05 (×2): 1 [IU] via SUBCUTANEOUS
  Administered 2016-10-06 – 2016-10-08 (×3): 2 [IU] via SUBCUTANEOUS
  Filled 2016-10-04: qty 1
  Filled 2016-10-04 (×4): qty 2

## 2016-10-04 MED ORDER — ACETAMINOPHEN 325 MG PO TABS: 650.0000 mg | ORAL_TABLET | Freq: Four times a day (QID) | ORAL | Status: DC | PRN

## 2016-10-04 MED ORDER — ALBUTEROL SULFATE (2.5 MG/3ML) 0.083% IN NEBU: 2.5000 mg | INHALATION_SOLUTION | RESPIRATORY_TRACT | Status: DC | PRN

## 2016-10-04 MED ORDER — SPIRONOLACTONE 25 MG PO TABS
50.0000 mg | ORAL_TABLET | Freq: Two times a day (BID) | ORAL | Status: DC
  Administered 2016-10-04 – 2016-10-08 (×8): 50 mg via ORAL
  Filled 2016-10-04 (×10): qty 2

## 2016-10-04 MED ORDER — SODIUM CHLORIDE 0.9% FLUSH
3.0000 mL | Freq: Two times a day (BID) | INTRAVENOUS | Status: DC
  Administered 2016-10-04 – 2016-10-08 (×7): 3 mL via INTRAVENOUS

## 2016-10-04 MED ORDER — SODIUM CHLORIDE 0.9 % IV SOLN
250.0000 mL | INTRAVENOUS | Status: DC | PRN
  Administered 2016-10-06: 250 mL via INTRAVENOUS

## 2016-10-04 MED ORDER — RIFAXIMIN 550 MG PO TABS
550.0000 mg | ORAL_TABLET | Freq: Two times a day (BID) | ORAL | Status: DC
  Administered 2016-10-04 – 2016-10-08 (×8): 550 mg via ORAL
  Filled 2016-10-04 (×10): qty 1

## 2016-10-04 MED ORDER — OLANZAPINE 10 MG PO TABS
30.0000 mg | ORAL_TABLET | Freq: Every day | ORAL | Status: DC
  Administered 2016-10-04 – 2016-10-07 (×4): 30 mg via ORAL
  Filled 2016-10-04 (×4): qty 3

## 2016-10-04 MED ORDER — LEVETIRACETAM 500 MG PO TABS
1000.0000 mg | ORAL_TABLET | Freq: Two times a day (BID) | ORAL | Status: DC
  Administered 2016-10-04 – 2016-10-08 (×8): 1000 mg via ORAL
  Filled 2016-10-04 (×8): qty 2

## 2016-10-04 MED ORDER — FUROSEMIDE 10 MG/ML IJ SOLN
20.0000 mg | Freq: Two times a day (BID) | INTRAMUSCULAR | Status: DC
  Administered 2016-10-04 – 2016-10-08 (×8): 20 mg via INTRAVENOUS
  Filled 2016-10-04 (×8): qty 2

## 2016-10-04 MED ORDER — LACTULOSE 10 GM/15ML PO SOLN
30.0000 g | ORAL | Status: DC
  Administered 2016-10-04 – 2016-10-06 (×10): 30 g via ORAL
  Filled 2016-10-04 (×10): qty 60

## 2016-10-04 MED ORDER — LORAZEPAM 1 MG PO TABS: 1.0000 mg | ORAL_TABLET | Freq: Four times a day (QID) | ORAL | Status: AC | PRN

## 2016-10-04 MED ORDER — PANTOPRAZOLE SODIUM 40 MG PO TBEC
40.0000 mg | DELAYED_RELEASE_TABLET | Freq: Every day | ORAL | Status: DC
  Administered 2016-10-04: 40 mg via ORAL
  Filled 2016-10-04: qty 1

## 2016-10-04 MED ORDER — ONDANSETRON HCL 4 MG PO TABS: 4.0000 mg | ORAL_TABLET | Freq: Four times a day (QID) | ORAL | Status: DC | PRN

## 2016-10-04 MED ORDER — ENOXAPARIN SODIUM 40 MG/0.4ML ~~LOC~~ SOLN
40.0000 mg | Freq: Two times a day (BID) | SUBCUTANEOUS | Status: DC
  Administered 2016-10-04 – 2016-10-05 (×2): 40 mg via SUBCUTANEOUS
  Filled 2016-10-04 (×2): qty 0.4

## 2016-10-04 MED ORDER — ENOXAPARIN SODIUM 40 MG/0.4ML ~~LOC~~ SOLN: 40.0000 mg | SUBCUTANEOUS | Status: DC

## 2016-10-04 MED ORDER — VITAMIN B-1 100 MG PO TABS
100.0000 mg | ORAL_TABLET | Freq: Every day | ORAL | Status: DC
  Administered 2016-10-04 – 2016-10-08 (×4): 100 mg via ORAL
  Filled 2016-10-04 (×4): qty 1

## 2016-10-04 MED ORDER — FOLIC ACID 1 MG PO TABS
1.0000 mg | ORAL_TABLET | Freq: Every day | ORAL | Status: DC
  Administered 2016-10-04 – 2016-10-08 (×5): 1 mg via ORAL
  Filled 2016-10-04 (×5): qty 1

## 2016-10-04 MED ORDER — LORAZEPAM 2 MG/ML IJ SOLN
1.0000 mg | Freq: Four times a day (QID) | INTRAMUSCULAR | Status: AC | PRN
  Administered 2016-10-05 (×2): 1 mg via INTRAVENOUS
  Filled 2016-10-04 (×2): qty 1

## 2016-10-04 MED ORDER — INSULIN ASPART 100 UNIT/ML ~~LOC~~ SOLN
0.0000 [IU] | Freq: Every day | SUBCUTANEOUS | Status: DC
  Filled 2016-10-04: qty 1

## 2016-10-04 MED ORDER — SODIUM CHLORIDE 0.9% FLUSH: 3.0000 mL | INTRAVENOUS | Status: DC | PRN

## 2016-10-04 MED ORDER — ONDANSETRON HCL 4 MG/2ML IJ SOLN: 4.0000 mg | Freq: Four times a day (QID) | INTRAMUSCULAR | Status: DC | PRN

## 2016-10-04 MED ORDER — MODAFINIL 100 MG PO TABS
100.0000 mg | ORAL_TABLET | Freq: Every day | ORAL | Status: DC
  Administered 2016-10-05 – 2016-10-08 (×4): 100 mg via ORAL
  Filled 2016-10-04 (×4): qty 1

## 2016-10-04 NOTE — Progress Notes (Signed)
New admit from ED, admission completed, meal delivered, patient picked up by US for abdominal US. Patient is on room air, no acute distress, ambulatory with cane and assist, undergoing evaluation for ascites. On room air. No family at bedside. UA collection pending.

## 2016-10-04 NOTE — ED Provider Notes (Addendum)
Howard [name]  ____________________________________________   I have reviewed the triage vital signs and the nursing notes.   HISTORY  Chief Complaint Shortness of Breath and Leg Swelling    HPI Wisdom A Howard [name] is a 36 y.o. male with a history of hepatic encephalopathy in the past, schizophrenia, ongoing alcohol abuse, type 2 diabetes and alcoholic cirrhosis. Patient states he has had to have his belly tapped in the past because of ascites. He states he feels that his ascites is a 68 cannot breathe. Patient states that he has not had any nausea or vomiting but he is short of breath especially when he lies flat because of his stomach. He also says some short of breath walking around. Denies any chest pain. Denies cough or fever. He states that he was drinking alcohol as recently as yesterday when he had 5 small drinks. He does not have any alcohol today he states. He denies any fever or abdominal pain.     Past Medical History:  Diagnosis Date  . Alcoholic cirrhosis of liver with ascites (HCC)   . Anemia   . COPD (chronic obstructive pulmonary disease) (HCC)   . Depression   . Diabetes (HCC)   . Diabetes mellitus, type II (HCC)   . Esophageal varices (HCC)   . Heart disease   . Hyperlipemia   . Hypertension   . Liver disease   . Multiple thyroid nodules   . Portal hypertensive gastropathy   . Schizophrenia [facility])     Patient Active Problem List   Diagnosis Date Noted  . DNR (do not resuscitate) discussion 08/11/2016  . Palliative care by specialist 08/11/2016  . Muscle weakness (generalized)   . Acute upper GI bleed   . GI bleed 08/07/2016  . Alcoholic cirrhosis of liver (HCC) 07/09/2016  . Acute respiratory failure with hypercapnia (HCC) 07/09/2016  . Hyperglycemia 07/09/2016  . Seizure (HCC) 06/10/2016  . Altered mental status 05/12/2016  . Headache 04/29/2016  . OSA (obstructive sleep apnea) 04/29/2016  .  Anemia 04/29/2016  . Thrombocytopenia (HCC) 04/29/2016  . Coagulopathy (HCC) 04/29/2016  . Obesity 04/29/2016  . Generalized weakness 04/29/2016  . Acute hepatic encephalopathy 03/14/2016  . Controlled type 2 diabetes mellitus without complication (HCC) 01/15/2016  . Normocytic anemia 12/12/2015  . Essential (primary) hypertension 12/11/2015  . Pure hypercholesterolemia 12/11/2015  . Fever 11/11/2015  . Ascites due to alcoholic cirrhosis (HCC) 11/11/2015  . Cirrhosis of liver with ascites (HCC) 11/11/2015  . Hepatic encephalopathy (HCC) 10/16/2015  . Type 2 diabetes mellitus (HCC) 10/16/2015  . Acute renal failure (ARF) (HCC) 07/30/2015  . 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 Alcoholic cirrhosis (HCC) 07/19/2015  . Hypoxia 07/04/2015  . Elevated transaminase level 07/04/2015  . DOE (dyspnea on exertion) 07/04/2015  . Alcohol abuse 05/31/2015  . Hypertension 05/29/2015  . Paranoid schizophrenia (HCC) 03/20/2015    Past Surgical History:  Procedure Laterality Date  . ESOPHAGOGASTRODUODENOSCOPY N/A 10/05/2015   Procedure: ESOPHAGOGASTRODUODENOSCOPY (EGD);  Surgeon: Elnita Maxwell, MD;  Location: [facility] ENDOSCOPY;  Service: Endoscopy;  Laterality: N/A;  . ESOPHAGOGASTRODUODENOSCOPY (EGD) WITH PROPOFOL N/A 08/03/2015   Procedure: ESOPHAGOGASTRODUODENOSCOPY (EGD) WITH PROPOFOL;  Surgeon: Elnita Maxwell, MD;  Location: [facility] ENDOSCOPY;  Service: Endoscopy;  Laterality: N/A;  . ESOPHAGOGASTRODUODENOSCOPY (EGD) WITH PROPOFOL N/A 08/31/2015   Procedure: ESOPHAGOGASTRODUODENOSCOPY (EGD) WITH PROPOFOL;  Surgeon: Elnita Maxwell, MD;  Location: [facility] ENDOSCOPY;  Service: Endoscopy;  Laterality: N/A;  . ESOPHAGOGASTRODUODENOSCOPY (EGD)  WITH PROPOFOL N/A 04/04/2016   Procedure: ESOPHAGOGASTRODUODENOSCOPY (EGD) WITH PROPOFOL;  Surgeon: Scot Junobert T Elliott, MD;  Location[facility] END[facility]COPY;  Service: Endoscopy;  Laterality: N/A;  . NO PAST SURGERIES      Prior to  Admission medications   Medication Sig Start Date End Date Taking? Authorizing Provider  acamprosate (CAMPRAL) 333 MG tablet Take 2 tablets (666 mg total) by mouth 3 (three) times daily with meals. 08/19/16  Yes Audery AmelJohn T [name], MD  citalopram (CELEXA) 20 MG tablet Take 1 tablet (20 mg total) by mouth every morning. 08/19/16  Yes Audery AmelJohn T [name], MD  furosemide (LASIX) 20 MG tablet Take 1 tablet (20 mg total) by mouth daily. 05/15/16  Yes Richard Renae GlossWieting, MD  lactulose (CHRONULAC) 10 GM/15ML solution Take 45 mLs (30 g total) by mouth every 4 (four) hours. 07/09/16  Yes Katharina Caperima Vaickute, MD  levETIRAcetam (KEPPRA) 1000 MG tablet Take 1 tablet (1,000 mg total) by mouth 2 (two) times daily. 07/09/16  Yes Katharina Caperima Vaickute, MD  losartan (COZAAR) 25 MG tablet Take 25 mg by mouth daily.   Yes Historical Provider, MD  metFORMIN (GLUCOPHAGE) 500 MG tablet Take 1 tablet (500 mg total) by mouth 2 (two) times daily with a meal. 05/15/16  Yes Alford Highlandichard Wieting, MD  modafinil (PROVIGIL) 100 MG tablet Take 1 tablet (100 mg total) by mouth daily. 08/13/16  Yes Auburn BilberryShreyang Patel, MD  Multiple Vitamin (MULTIVITAMIN WITH MINERALS) TABS tablet Take 1 tablet by mouth daily. 08/04/15  Yes Ramonita LabAruna Gouru, MD  nadolol (CORGARD) 20 MG tablet Take 20 mg by mouth daily.   Yes Historical Provider, MD  OLANZapine (ZYPREXA) 15 MG tablet Take 2 tablets (30 mg total) by mouth at bedtime. 05/22/16  Yes Audery AmelJohn T [name], MD  omeprazole (PRILOSEC) 40 MG capsule Take 1 capsule (40 mg total) by mouth every morning. 02/12/16  Yes Enid Baasadhika Kalisetti, MD  ondansetron (ZOFRAN) 4 MG tablet Take 1 tablet (4 mg total) by mouth every 8 (eight) hours as needed for nausea or vomiting. 08/26/16  Yes Governor Rooksebecca Lord, MD  rifaximin (XIFAXAN) 550 MG TABS tablet Take 1 tablet (550 mg total) by mouth 2 (two) times daily. 07/09/16  Yes Katharina Caperima Vaickute, MD  spironolactone (ALDACTONE) 50 MG tablet Take 1 tablet (50 mg total) by mouth 2 (two) times daily. 07/09/16  Yes Katharina Caperima Vaickute,  MD  sucralfate (CARAFATE) 1 g tablet Take 1 g by mouth 4 (four) times daily.   Yes Historical Provider, MD    Allergies Patient has no known allergies.  Family History  Problem Relation Age of Onset  . Heart disease Mother   . Hypertension Mother   . Hyperlipidemia Mother   . Stroke Father   . Heart attack Father   . Hypertension Father   . Heart disease Father   . Alcohol abuse Father   . Heart disease Brother     Social History Social History  Substance Use Topics  . Smoking status: Never Smoker  . Smokeless tobacco: Never Used  . Alcohol use 30.0 oz/week    50 Cans of beer per week     Comment: last drink on Friday 12/29    Review of Systems Constitutional: No fever/chills Eyes: No visual changes. ENT: No sore throat. No stiff neck no neck pain Cardiovascular: Denies chest pain. Respiratory: Positive positional shortness of breath. Gastrointestinal:   no vomiting.  No diarrhea.  No constipation. Genitourinary: Negative for dysuria. Musculoskeletal: Positive chronic lower extremity swelling Skin: Negative for rash. Neurological: Negative for severe  headaches, focal weakness or numbness. 10-point ROS otherwise negative.  ____________________________________________   PHYSICAL EXAM:  VITAL SIGNS: ED Triage Vitals  Enc Vitals Group     BP 10/04/16 1352 129/71     Pulse Rate 10/04/16 1352 96     Resp 10/04/16 1352 (!) 24     Temp 10/04/16 1352 98.2 F (36.8 C)     Temp Source 10/04/16 1352 Oral     SpO2 10/04/16 1352 (!) 88 %     Weight 10/04/16 1353 (!) 362 lb (164.2 kg)     Height --      Head Circumference --      Peak Flow --      Pain Score 10/04/16 1353 0     Pain Loc --      Pain Edu? --      Excl. in GC? --     Constitutional: Alert and oriented. Well appearing and in no acute distress. Eyes: Conjunctivae are normal. PERRL. EOMI. Head: Atraumatic. Nose: No congestion/rhinnorhea. Mouth/Throat: Mucous membranes are moist.  Oropharynx  non-erythematous. Neck: No stridor.   Nontender with no meningismus Cardiovascular: Normal rate, regular rhythm. Grossly normal heart sounds.  Good peripheral circulation. Respiratory: Normal respiratory effort.  No retractions. Lungs CTAB. Abdominal: Soft and nontender. Positive distention with a fluid wave but no tenderness No guarding no rebound no masses palpated. Morbid obesity noted. Back:  There is no focal tenderness or step off.  there is no midline tenderness there are no lesions noted. there is no CVA tenderness Musculoskeletal: No lower extremity tenderness, no upper extremity tenderness. No joint effusions, no DVT signs strong distal pulses + bilateral symmetric edema Neurologic:  Normal speech and language. No gross focal neurologic deficits are appreciated.  Skin:  Skin is warm, dry and intact. No rash noted. Psychiatric: Mood and affect are normal. Speech and behavior are normal.  ____________________________________________   LABS (all labs ordered are listed, but only abnormal results are displayed)  Labs Reviewed  CBC WITH DIFFERENTIAL/PLATELET  PROTIME-INR  COMPREHENSIVE METABOLIC PANEL  ETHANOL  AMMONIA  URINALYSIS, COMPLETE (UACMP) WITH MICROSCOPIC  TROPONIN I  BRAIN NATRIURETIC PEPTIDE   ____________________________________________  EKG  I personally interpreted any EKGs ordered by me or triage Normal sinus rhythm at 95 bpm no acute ST elevation or acute ST depression normal axis unremarkable EKG ____________________________________________  RADIOLOGY  I reviewed any imaging ordered by me or triage that were performed during my shift and, if possible, patient and/or family made aware of any abnormal findings. ____________________________________________   PROCEDURES  Procedure(s) performed: None  Procedures  Critical Care performed: None  ____________________________________________   INITIAL IMPRESSION / ASSESSMENT AND PLAN / ED  COURSE  Pertinent labs & imaging results that were available during my care of the patient were reviewed by me and considered in my medical decision making (see chart for details).  Patient presents today complaining of shortness of breath which is positional which she is treated his to his ascites. Patient is morbidly obese and does have what appears to be a significant amount of ascites. Nothing to suggest SBP. At this time I have low suspicion for ACS PE or dissection but we'll do basic blood work. Patient may require admission for paracentesis.  ----------------------------------------- 3:21 PM on 10/04/2016 -----------------------------------------  Signed out to Dr. Langston Masker at the end of my shift.  Clinical Course    ____________________________________________   FINAL CLINICAL IMPRESSION(S) / ED DIAGNOSES  Final diagnoses:  None  This chart was dictated using voice recognition software.  Despite best efforts to proofread,  errors can occur which can change meaning.      Jeanmarie Plant, MD 10/04/16 1436    Jeanmarie Plant, MD 10/04/16 [phone]

## 2016-10-04 NOTE — ED Provider Notes (Signed)
Signout from Dr. Alphonzo LemmingsMcShane in this 36 year old male with a history of alcoholic cirrhosis and abdominal distention. Plan is to admit to the hospital after labs return. Suspicion is that the patient has ascites and this is causing hypoxia from abdominal competition.   Physical Exam  BP (!) 146/75   Pulse 91   Temp 98.2 F (36.8 C) (Oral)   Resp (!) 24   Wt (!) 362 lb (164.2 kg)   SpO2 94%   BMI 45.25 kg/m    Physical Exam Patient with abdominal distention but without tenderness palpation. Saturating 95-96% on nasal cannula oxygen. Negative for asterixis. ED Course  Procedures  MDM Patient to be admitted to the hospital. Has a history of multiple ultrasounds including one done earlier this month, without ascites. Dr. Imogene Burnhen to admit. Also found to have an elevated ammonia. Patient admits to being noncompliant with his lactulose. Patient as well as family understand the plan for admission a month to comply.       Myrna Blazeravid Matthew Schaevitz, MD 10/04/16 [phone]

## 2016-10-04 NOTE — ED Notes (Signed)
Pt states he is unable to give urine sample at this time. Pt aware of need for UA.  

## 2016-10-04 NOTE — H&P (Addendum)
[facility] [place] at [facility]   PATIENT NAME: Howard BowlerJoey [name]    MR#:  [id number]  DATE OF BIRTH:  1980/10/02  DATE OF ADMISSION:  10/04/2016  PRIMARY CARE PHYSICIAN: Marisue IvanLINTHAVONG, [name], MD   REQUESTING/REFERRING PHYSICIAN: Dr. Loreli Dollaravid Shaevitz.  CHIEF COMPLAINT:   Chief Complaint  Patient presents with  . Shortness of Breath  . Leg Swelling   Shortness of breath and leg swelling for a few days. HISTORY OF PRESENT ILLNESS:  Howard BowlerJoey Bonadonna  is a 36 y.o. male with a known history of Alcohol abuse, liver cirrhosis, ascites, hypertension and diabetes. The patient to present to the ED with the above chief complaints. He denies any fever, chills, abdominal pain, nausea, vomiting or diarrhea. He is supposed to take lactulose, Lasix and rifaximin, but he is noncompliant to medications. He has hypoxia with oxygen level at 88% in room air. He is put on oxygen by nasal cannula. Per ED physician, Patient is morbidly obese and does have what appears to be a significant amount of ascites. His ammonia level is elevated at 111. The patient's and he is still drinking alcohol, the last drink was yesterday.  PAST MEDICAL HISTORY:   Past Medical History:  Diagnosis Date  . Alcoholic cirrhosis of liver with ascites (HCC)   . Anemia   . COPD (chronic obstructive pulmonary disease) (HCC)   . Depression   . Diabetes (HCC)   . Diabetes mellitus, type II (HCC)   . Esophageal varices (HCC)   . Heart disease   . Hyperlipemia   . Hypertension   . Liver disease   . Multiple thyroid nodules   . Portal hypertensive gastropathy   . Schizophrenia (HCC)     PAST SURGICAL HISTORY:   Past Surgical History:  Procedure Laterality Date  . ESOPHAGOGASTRODUODENOSCOPY N/A 10/05/2015   Procedure: ESOPHAGOGASTRODUODENOSCOPY (EGD);  Surgeon: Elnita MaxwellMatthew Gordon Rein, MD;  [facility];  Service: Endoscopy;  Laterality: N/A;  . ESOPHAGOGASTRODUODENOSCOPY (EGD) WITH PROPOFOL N/A 08/03/2015   Procedure: ESOPHAGOGASTRODUODENOSCOPY (EGD) WITH PROPOFOL;  Surgeon: Elnita MaxwellMatthew Gordon Rein, MD;  [facility];  Service: Endoscopy;  Laterality: N/A;  . ESOPHAGOGASTRODUODENOSCOPY (EGD) WITH PROPOFOL N/A 08/31/2015   Procedure: ESOPHAGOGASTRODUODENOSCOPY (EGD) WITH PROPOFOL;  Surgeon: Elnita MaxwellMatthew Gordon Rein, MD;  [facility];  Service: Endoscopy;  Laterality: N/A;  . ESOPHAGOGASTRODUODENOSCOPY (EGD) WITH PROPOFOL N/A 04/04/2016   Procedure: ESOPHAGOGASTRODUODENOSCOPY (EGD) WITH PROPOFOL;  Surgeon: Scot Junobert T Elliott, MD;  [facility];  Service: Endoscopy;  Laterality: N/A;  . NO PAST SURGERIES      SOCIAL HISTORY:   Social History  Substance Use Topics  . Smoking status: Never Smoker  . Smokeless tobacco: Never Used  . Alcohol use 30.0 oz/week    50 Cans of beer per week     Comment: last drink on Friday 12/29    FAMILY HISTORY:   Family History  Problem Relation Age of Onset  . Heart disease Mother   . Hypertension Mother   . Hyperlipidemia Mother   . Stroke Father   . Heart attack Father   . Hypertension Father   . Heart disease Father   . Alcohol abuse Father   . Heart disease Brother     DRUG ALLERGIES:  No Known Allergies  REVIEW OF SYSTEMS:   Review of Systems  Constitutional: Positive for malaise/fatigue. Negative for chills and fever.  HENT: Negative for nosebleeds and sinus pain.   Eyes: Negative for blurred vision and double vision.  Respiratory: Positive for  shortness of breath. Negative for cough, hemoptysis, sputum production, wheezing and stridor.   Cardiovascular: Positive for orthopnea and leg swelling. Negative for chest pain and palpitations.  Gastrointestinal: Negative for abdominal pain, blood in stool, constipation, diarrhea, melena, nausea and vomiting.  Genitourinary: Negative for dysuria, hematuria and urgency.  Musculoskeletal: Negative for joint pain.  Skin: Negative for itching and rash.  Neurological: Negative  for dizziness, tremors, sensory change, focal weakness and loss of consciousness.  Psychiatric/Behavioral: Negative for depression. The patient is not nervous/anxious.     MEDICATIONS AT HOME:   Prior to Admission medications   Medication Sig Start Date End Date Taking? Authorizing Provider  acamprosate (CAMPRAL) 333 MG tablet Take 2 tablets (666 mg total) by mouth 3 (three) times daily with meals. 08/19/16  Yes Audery Amel, MD  citalopram (CELEXA) 20 MG tablet Take 1 tablet (20 mg total) by mouth every morning. 08/19/16  Yes Audery Amel, MD  furosemide (LASIX) 20 MG tablet Take 1 tablet (20 mg total) by mouth daily. 05/15/16  Yes Richard Renae Gloss, MD  lactulose (CHRONULAC) 10 GM/15ML solution Take 45 mLs (30 g total) by mouth every 4 (four) hours. 07/09/16  Yes Katharina Caper, MD  levETIRAcetam (KEPPRA) 1000 MG tablet Take 1 tablet (1,000 mg total) by mouth 2 (two) times daily. 07/09/16  Yes Katharina Caper, MD  losartan (COZAAR) 25 MG tablet Take 25 mg by mouth daily.   Yes Historical Provider, MD  metFORMIN (GLUCOPHAGE) 500 MG tablet Take 1 tablet (500 mg total) by mouth 2 (two) times daily with a meal. 05/15/16  Yes Alford Highland, MD  modafinil (PROVIGIL) 100 MG tablet Take 1 tablet (100 mg total) by mouth daily. 08/13/16  Yes Auburn Bilberry, MD  Multiple Vitamin (MULTIVITAMIN WITH MINERALS) TABS tablet Take 1 tablet by mouth daily. 08/04/15  Yes Ramonita Lab, MD  nadolol (CORGARD) 20 MG tablet Take 20 mg by mouth daily.   Yes Historical Provider, MD  OLANZapine (ZYPREXA) 15 MG tablet Take 2 tablets (30 mg total) by mouth at bedtime. 05/22/16  Yes Audery Amel, MD  omeprazole (PRILOSEC) 40 MG capsule Take 1 capsule (40 mg total) by mouth every morning. 02/12/16  Yes Enid Baas, MD  ondansetron (ZOFRAN) 4 MG tablet Take 1 tablet (4 mg total) by mouth every 8 (eight) hours as needed for nausea or vomiting. 08/26/16  Yes Governor Rooks, MD  rifaximin (XIFAXAN) 550 MG TABS tablet Take 1  tablet (550 mg total) by mouth 2 (two) times daily. 07/09/16  Yes Katharina Caper, MD  spironolactone (ALDACTONE) 50 MG tablet Take 1 tablet (50 mg total) by mouth 2 (two) times daily. 07/09/16  Yes Katharina Caper, MD  sucralfate (CARAFATE) 1 g tablet Take 1 g by mouth 4 (four) times daily.   Yes Historical Provider, MD      VITAL SIGNS:  Blood pressure (!) 165/71, pulse 88, temperature 98.2 F (36.8 C), temperature source Oral, resp. rate (!) 22, weight (!) 362 lb (164.2 kg), SpO2 95 %.  PHYSICAL EXAMINATION:  Physical Exam  GENERAL:  36 y.o.-year-old patient lying in the bed with no acute distress. Morbidly obese. EYES: Pupils equal, round, reactive to light and accommodation. No scleral icterus. Extraocular muscles intact.  HEENT: Head atraumatic, normocephalic. Oropharynx and nasopharynx clear.  NECK:  Supple, no jugular venous distention. No thyroid enlargement, no tenderness.  LUNGS: Normal breath sounds bilaterally, no wheezing, rales,rhonchi or crepitation. No use of accessory muscles of respiration.  CARDIOVASCULAR: S1, S2 normal. No murmurs,  rubs, or gallops.  ABDOMEN: Soft, nontender, abdominal distention. Bowel sounds present. It is difficult to estimate when the patient has ascites or organomegaly or mass due to morbid obesity.  EXTREMITIES: trace leg edema, no cyanosis, or clubbing.  NEUROLOGIC: Cranial nerves II through XII are intact. Muscle strength 5/5 in all extremities. Sensation intact. Gait not checked.  PSYCHIATRIC: The patient is alert and oriented x 3.  SKIN: No obvious rash, lesion, or ulcer.   LABORATORY PANEL:   CBC  Recent Labs Lab 10/04/16 1440  WBC 8.0  HGB 7.6*  HCT 25.7*  PLT 144*   ------------------------------------------------------------------------------------------------------------------  Chemistries   Recent Labs Lab 10/04/16 1440  NA 140  K 4.7  CL 103  CO2 33*  GLUCOSE 111*  BUN 14  CREATININE 0.91  CALCIUM 8.6*  AST 39    ALT 26  ALKPHOS 123  BILITOT 0.6   ------------------------------------------------------------------------------------------------------------------  Cardiac Enzymes  Recent Labs Lab 10/04/16 1440  TROPONINI <0.03   ------------------------------------------------------------------------------------------------------------------  RADIOLOGY:  Dg Chest 2 View  Result Date: 10/04/2016 CLINICAL DATA:  Nonsmoker. Pt is having SOB that started last night along with abdominal pain and distention. Pt has ETOH abuse. Hx of COPD, Diabetes, Heart disease, HTN, and Liver disease. EXAM: CHEST - 2 VIEW COMPARISON:  09/21/2016 FINDINGS: Low lung volumes with resultant crowding of bronchovascular structures. No confluent airspace disease or overt edema. Heart size and mediastinal contours are within normal limits. Blunting of posterior costophrenic angles suggesting small effusions. Regional bones unremarkable IMPRESSION: 1. Possible small pleural effusions. Otherwise stable appearance since previous exam. Electronically Signed   By: Corlis Leak M.D.   On: 10/04/2016 14:47      IMPRESSION AND PLAN:   Acute respiratory failure with hypoxia, possible due to ascites/OSA. The patient will be placed for observation. Continue oxygen by nasal cannular, DuoNeb when necessary.  Ascites due to liver cirrhosis. Follow-up abdominal ultrasound to evaluate the amount of ascites, then decide whether patient needs paracentesis. Increase Lasix to 20 mg IV twice a day, continue spironolactone.  Hyperammonemia. Continue lactulose and rifaximin, follow-up ammonia level.  Alcohol abuse. Start CIWA protocol. Social worker consult for outpatient detox referral.  Anemia of chronic disease. Stable.  Diabetes. Hold metformin and start sliding scale.  Hypertension. Continue home hypertension medication.  Morbid obesity with possible OSA.   All the records are reviewed and case discussed with ED  provider. Management plans discussed with the patient, his mother and they are in agreement.  CODE STATUS: Full code  TOTAL TIME TAKING CARE OF THIS PATIENT: 48 minutes.    Shaune Pollack M.D on 10/04/2016 at 4:32 PM  Between 7am to 6pm - Pager - [phone]  After 6pm go to [email or web address] - Social research officer, government  [facility]  Hospitalists  Office  [phone]  CC: Primary care physician; Marisue Ivan, MD   Note: This dictation was prepared with Dragon dictation along with smaller phrase technology. Any transcriptional errors that result from this process are unintentional.

## 2016-10-04 NOTE — Progress Notes (Signed)
Anticoagulation monitoring:    36 yo morbidly obese male ordered enoxaparin 40mg  SQ Q24hr.   Per protocol for patients with BMI > 40 and CrCl > 7630mL/min, will transition patient to enoxaparin 40mg  SQ Q12hr.    Pharmacy will continue to monitor and adjust per consult.   MLS

## 2016-10-04 NOTE — ED Triage Notes (Signed)
Pt to ed with c/o sob and labored resp, worse with walking, worse with activity.  Pt states hx of cirrhosis and reports he has had to get fluid drawn off of abd in the past.  Pt with distended abd.  Skin warm and dry.  sats 85%-88% on RA.

## 2016-10-05 ENCOUNTER — Observation Stay: Payer: Medicare Other

## 2016-10-05 ENCOUNTER — Inpatient Hospital Stay: Payer: Medicare Other

## 2016-10-05 DIAGNOSIS — E785 Hyperlipidemia, unspecified: Secondary | ICD-10-CM | POA: Diagnosis present

## 2016-10-05 DIAGNOSIS — K7031 Alcoholic cirrhosis of liver with ascites: Secondary | ICD-10-CM | POA: Diagnosis not present

## 2016-10-05 DIAGNOSIS — J449 Chronic obstructive pulmonary disease, unspecified: Secondary | ICD-10-CM | POA: Diagnosis present

## 2016-10-05 DIAGNOSIS — Z9981 Dependence on supplemental oxygen: Secondary | ICD-10-CM | POA: Diagnosis not present

## 2016-10-05 DIAGNOSIS — K766 Portal hypertension: Secondary | ICD-10-CM | POA: Diagnosis present

## 2016-10-05 DIAGNOSIS — J9621 Acute and chronic respiratory failure with hypoxia: Secondary | ICD-10-CM | POA: Diagnosis present

## 2016-10-05 DIAGNOSIS — Z9114 Patient's other noncompliance with medication regimen: Secondary | ICD-10-CM | POA: Diagnosis not present

## 2016-10-05 DIAGNOSIS — Z6841 Body Mass Index (BMI) 40.0 and over, adult: Secondary | ICD-10-CM | POA: Diagnosis not present

## 2016-10-05 DIAGNOSIS — Y92009 Unspecified place in unspecified non-institutional (private) residence as the place of occurrence of the external cause: Secondary | ICD-10-CM | POA: Diagnosis not present

## 2016-10-05 DIAGNOSIS — D638 Anemia in other chronic diseases classified elsewhere: Secondary | ICD-10-CM | POA: Diagnosis present

## 2016-10-05 DIAGNOSIS — J9622 Acute and chronic respiratory failure with hypercapnia: Secondary | ICD-10-CM | POA: Diagnosis present

## 2016-10-05 DIAGNOSIS — Z8249 Family history of ischemic heart disease and other diseases of the circulatory system: Secondary | ICD-10-CM | POA: Diagnosis not present

## 2016-10-05 DIAGNOSIS — E119 Type 2 diabetes mellitus without complications: Secondary | ICD-10-CM | POA: Diagnosis present

## 2016-10-05 DIAGNOSIS — D62 Acute posthemorrhagic anemia: Secondary | ICD-10-CM | POA: Diagnosis not present

## 2016-10-05 DIAGNOSIS — K921 Melena: Secondary | ICD-10-CM | POA: Diagnosis present

## 2016-10-05 DIAGNOSIS — Z9119 Patient's noncompliance with other medical treatment and regimen: Secondary | ICD-10-CM | POA: Diagnosis not present

## 2016-10-05 DIAGNOSIS — K3189 Other diseases of stomach and duodenum: Secondary | ICD-10-CM | POA: Diagnosis present

## 2016-10-05 DIAGNOSIS — K7030 Alcoholic cirrhosis of liver without ascites: Secondary | ICD-10-CM | POA: Diagnosis not present

## 2016-10-05 DIAGNOSIS — T473X6A Underdosing of saline and osmotic laxatives, initial encounter: Secondary | ICD-10-CM | POA: Diagnosis present

## 2016-10-05 DIAGNOSIS — I10 Essential (primary) hypertension: Secondary | ICD-10-CM | POA: Diagnosis present

## 2016-10-05 DIAGNOSIS — D509 Iron deficiency anemia, unspecified: Secondary | ICD-10-CM | POA: Diagnosis present

## 2016-10-05 DIAGNOSIS — Z823 Family history of stroke: Secondary | ICD-10-CM | POA: Diagnosis not present

## 2016-10-05 DIAGNOSIS — E662 Morbid (severe) obesity with alveolar hypoventilation: Secondary | ICD-10-CM | POA: Diagnosis present

## 2016-10-05 DIAGNOSIS — F1010 Alcohol abuse, uncomplicated: Secondary | ICD-10-CM | POA: Diagnosis present

## 2016-10-05 DIAGNOSIS — R0902 Hypoxemia: Secondary | ICD-10-CM | POA: Diagnosis present

## 2016-10-05 DIAGNOSIS — F329 Major depressive disorder, single episode, unspecified: Secondary | ICD-10-CM | POA: Diagnosis present

## 2016-10-05 DIAGNOSIS — Z811 Family history of alcohol abuse and dependence: Secondary | ICD-10-CM | POA: Diagnosis not present

## 2016-10-05 DIAGNOSIS — F209 Schizophrenia, unspecified: Secondary | ICD-10-CM | POA: Diagnosis present

## 2016-10-05 LAB — COMPREHENSIVE METABOLIC PANEL
ALBUMIN: 3 g/dL — AB (ref 3.5–5.0)
ALK PHOS: 114 U/L (ref 38–126)
ALT: 24 U/L (ref 17–63)
AST: 37 U/L (ref 15–41)
Anion gap: 4 — ABNORMAL LOW (ref 5–15)
BUN: 11 mg/dL (ref 6–20)
CHLORIDE: 101 mmol/L (ref 101–111)
CO2: 38 mmol/L — AB (ref 22–32)
CREATININE: 0.75 mg/dL (ref 0.61–1.24)
Calcium: 8.7 mg/dL — ABNORMAL LOW (ref 8.9–10.3)
GFR calc non Af Amer: >60 mL/min (ref >60)
GLUCOSE: 132 mg/dL — AB (ref 65–99)
Potassium: 4.3 mmol/L (ref 3.5–5.1)
SODIUM: 143 mmol/L (ref 135–145)
Total Bilirubin: 0.6 mg/dL (ref 0.3–1.2)
Total Protein: 7.2 g/dL (ref 6.5–8.1)

## 2016-10-05 LAB — CBC
HCT: 25.1 % — ABNORMAL LOW (ref 40.0–52.0)
HEMOGLOBIN: 7.5 g/dL — AB (ref 13.0–18.0)
MCH: 23.3 pg — AB (ref 26.0–34.0)
MCHC: 30 g/dL — ABNORMAL LOW (ref 32.0–36.0)
MCV: 77.9 fL — AB (ref 80.0–100.0)
Platelets: 123 10*3/uL — ABNORMAL LOW (ref 150–440)
RBC: 3.23 MIL/uL — AB (ref 4.40–5.90)
RDW: 17.9 % — ABNORMAL HIGH (ref 11.5–14.5)
WBC: 6.8 10*3/uL (ref 3.8–10.6)

## 2016-10-05 LAB — BLOOD GAS, ARTERIAL
ACID-BASE EXCESS: 15.3 mmol/L — AB (ref 0.0–2.0)
Bicarbonate: 43.8 mmol/L — ABNORMAL HIGH (ref 20.0–28.0)
FIO2: 32
O2 SAT: 94.2 %
Patient temperature: 37
pCO2 arterial: 89 mmHg (ref 32.0–48.0)
pH, Arterial: 7.3 — ABNORMAL LOW (ref 7.350–7.450)
pO2, Arterial: 79 mmHg — ABNORMAL LOW (ref 83.0–108.0)

## 2016-10-05 LAB — AMMONIA: AMMONIA: 80 umol/L — AB (ref 9–35)

## 2016-10-05 LAB — HEMOGLOBIN
HEMOGLOBIN: 7.5 g/dL — AB (ref 13.0–18.0)
Hemoglobin: 7.6 g/dL — ABNORMAL LOW (ref 13.0–18.0)

## 2016-10-05 LAB — GLUCOSE, CAPILLARY
GLUCOSE-CAPILLARY: 143 mg/dL — AB (ref 65–99)
GLUCOSE-CAPILLARY: 127 mg/dL — AB (ref 65–99)
Glucose-Capillary: 152 mg/dL — ABNORMAL HIGH (ref 65–99)
Glucose-Capillary: 114 mg/dL — ABNORMAL HIGH (ref 65–99)

## 2016-10-05 MED ORDER — LACTULOSE ENEMA
300.0000 mL | Freq: Three times a day (TID) | ORAL | Status: DC
  Filled 2016-10-05 (×3): qty 300

## 2016-10-05 MED ORDER — SODIUM CHLORIDE 0.9 % IV SOLN
50.0000 ug/h | INTRAVENOUS | Status: DC
  Administered 2016-10-05 – 2016-10-06 (×3): 50 ug/h via INTRAVENOUS
  Filled 2016-10-05 (×7): qty 1

## 2016-10-05 MED ORDER — CIPROFLOXACIN IN D5W 400 MG/200ML IV SOLN
400.0000 mg | Freq: Two times a day (BID) | INTRAVENOUS | Status: DC
  Administered 2016-10-05 – 2016-10-06 (×3): 400 mg via INTRAVENOUS
  Filled 2016-10-05 (×7): qty 200

## 2016-10-05 MED ORDER — PANTOPRAZOLE SODIUM 40 MG IV SOLR
40.0000 mg | Freq: Two times a day (BID) | INTRAVENOUS | Status: DC
  Administered 2016-10-05 – 2016-10-08 (×7): 40 mg via INTRAVENOUS
  Filled 2016-10-05 (×7): qty 40

## 2016-10-05 MED ORDER — NALOXONE HCL 0.4 MG/ML IJ SOLN: 0.4000 mg | INTRAMUSCULAR | Status: DC | PRN

## 2016-10-05 MED ORDER — OCTREOTIDE LOAD VIA INFUSION
50.0000 ug | Freq: Once | INTRAVENOUS | Status: AC
  Administered 2016-10-05: 50 ug via INTRAVENOUS
  Filled 2016-10-05: qty 25

## 2016-10-05 NOTE — Progress Notes (Signed)
CH responded to Pg. Pt receiving care with mother in room. Pt transferred to ICU. CH escorted mother to ICU Waiting Room. CH is available.   10/05/16 1145  Clinical Encounter Type  Visited With Patient and family together;Health care provider  Visit Type Initial  Referral From Nurse  Spiritual Encounters  Spiritual Needs Emotional  Stress Factors  Patient Stress Factors Health changes  Family Stress Factors Major life changes

## 2016-10-05 NOTE — Progress Notes (Addendum)
Dr. Juliene PinaMody assessing pt. He is more awake now while speaking to him. He is oriented x 4. Per Dr. Juliene PinaMody, he no longer needs ABG drawn.

## 2016-10-05 NOTE — Progress Notes (Signed)
[facility] - [place] [facility]   PATIENT NAME: Howard [name]    MR#:  [id number]  DATE OF BIRTH:  February 24, 1981  SUBJECTIVE:   Patient was sleepy this morning after receiving pain medications however mental status is now improved. Patient reports shortness of breath. He was chronic oxygen at home and has been compliant with this. He was recently discharged from the hospital for elevated ammonia level and hepatic encephalopathy. He denies fevers, abdominal pain, nausea and vomiting. He is stating that he has had dark stools recently. He reports that he has had varices banded over a year ago.  REVIEW OF SYSTEMS:    Review of Systems  Constitutional: Positive for malaise/fatigue. Negative for chills and fever.  HENT: Negative.  Negative for ear discharge, ear pain, hearing loss, nosebleeds and sore throat.   Eyes: Negative.  Negative for blurred vision and pain.  Respiratory: Positive for cough and shortness of breath. Negative for hemoptysis and wheezing.   Cardiovascular: Positive for leg swelling. Negative for chest pain and palpitations.  Gastrointestinal: Positive for melena. Negative for abdominal pain, blood in stool, diarrhea, nausea and vomiting.  Genitourinary: Negative.  Negative for dysuria.  Musculoskeletal: Negative.  Negative for back pain.  Skin: Negative.   Neurological: Positive for weakness. Negative for dizziness, tremors, speech change, focal weakness, seizures and headaches.  Endo/Heme/Allergies: Negative.  Does not bruise/bleed easily.  Psychiatric/Behavioral: Negative.  Negative for depression, hallucinations and suicidal ideas.    Tolerating Diet:       DRUG ALLERGIES:  No Known Allergies  VITALS:  Blood pressure (!) 158/71, pulse 90, temperature 98.3 F (36.8 C), temperature source Oral, resp. rate (!) 22, height 6\' 3"  (1.905 m), weight (!) 173.1 kg (381 lb 11.2 oz), SpO2 94 %.  PHYSICAL EXAMINATION:   Physical Exam  Constitutional:  He is oriented to person, place, and time. No distress.  Obese   HENT:  Head: Normocephalic.  Eyes: No scleral icterus.  Neck: Normal range of motion. Neck supple. No JVD present. No tracheal deviation present.  Cardiovascular: Normal rate, regular rhythm and normal heart sounds.  Exam reveals no gallop and no friction rub.   No murmur heard. Pulmonary/Chest: Effort normal and breath sounds normal. No respiratory distress. He has no wheezes. He has no rales. He exhibits no tenderness.  Abdominal: Bowel sounds are normal. He exhibits distension. He exhibits no mass. There is no tenderness. There is no rebound and no guarding.  Musculoskeletal: Normal range of motion. He exhibits edema.  Neurological: He is alert and oriented to person, place, and time.  Answers all questions appropriately  Skin: Skin is warm. No rash noted. No erythema.  Psychiatric: Affect normal.      LABORATORY PANEL:   CBC  Recent Labs Lab 10/05/16 0514  WBC 6.8  HGB 7.5*  HCT 25.1*  PLT 123*   ------------------------------------------------------------------------------------------------------------------  Chemistries   Recent Labs Lab 10/05/16 0514  NA 143  K 4.3  CL 101  CO2 38*  GLUCOSE 132*  BUN 11  CREATININE 0.75  CALCIUM 8.7*  AST 37  ALT 24  ALKPHOS 114  BILITOT 0.6   ------------------------------------------------------------------------------------------------------------------  Cardiac Enzymes  Recent Labs Lab 10/04/16 1440  TROPONINI <0.03   ------------------------------------------------------------------------------------------------------------------  RADIOLOGY:  Dg Chest 2 View  Result Date: 10/04/2016 CLINICAL DATA:  Nonsmoker. Pt is having SOB that started last night along with abdominal pain and distention. Pt has ETOH abuse. Hx of COPD, Diabetes, Heart disease, HTN, and Liver disease. EXAM:  CHEST - 2 VIEW COMPARISON:  09/21/2016 FINDINGS: Low lung volumes  with resultant crowding of bronchovascular structures. No confluent airspace disease or overt edema. Heart size and mediastinal contours are within normal limits. Blunting of posterior costophrenic angles suggesting small effusions. Regional bones unremarkable IMPRESSION: 1. Possible small pleural effusions. Otherwise stable appearance since previous exam. Electronically Signed   By: Corlis Leak  Hassell M.D.   On: 10/04/2016 14:47   [place] Abdomen Limited  Result Date: 10/04/2016 CLINICAL DATA:  Abdominal distension EXAM: LIMITED ABDOMEN ULTRASOUND FOR ASCITES TECHNIQUE: Limited ultrasound survey for ascites was performed in all four abdominal quadrants. COMPARISON:  None. FINDINGS: Ascites is identified in the left upper quadrant and bilateral lower quadrants. The amount of ascites is new since September 23, 2016. The largest pocket in the right lower quadrant measures 3.8 cm. IMPRESSION: Ascites as above. 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  1. Acute on chronic hypoxic respiratory failure in the setting of ascites  NOW back on baseline home O2 of 2 liters Continue diuresis with IV Lasix for ascites  He had Unremarkable echocardiogram November 2017  2. Liver cirrhosis with ascites/portal gastropathy: Patient will need to undergo paracentesis. Order written and labs requested. Continue nadolol Continue Lasix and Aldactone  3. GI bleed with dark-colored stools and history of esophageal varices with banding: Baseline hemoglobin 8 Start octreotide gtt and Protonix IV every 12 Hemoglobin every 8 hours Discuss case with GI consultant Plan for EGD in a.m. No indication for blood transfusion at this point, but would monitor  closely Start ciprofloxacin due to GI bleed and ascites Continue Carafate 4. Elevated ammonia level with baseline mentation Continue lactulose and rifaximin Ammonia level in a.m.  5. EtOH abuse: Patient's last increase yesterday Continue CIWA protocol  6. Acute on chronic anemia: Plan as outlined above  7. History of COPD without exacerbation with chronic respiratory failure on oxygen 8. Diabetes: Continue sliding scale insulin. Metformin should not be continued at discharge  9. Schizophrenia on Zyprexa/Provigil/Keppra/ Celexa  10. Essential hypertension on Cozaar   Management plans discussed with the patient and he is in agreement.  CODE STATUS: full  TOTAL TIME TAKING CARE OF THIS PATIENT: 33 minutes.   D/w GI consultant about GIB/melena/hx of EV banding.Sandria Manly. Gi to see later this am  POSSIBLE D/C 2-3 days, DEPENDING ON CLINICAL CONDITION.   Tyannah Sane M.D on 10/05/2016 at 9:00 AM  Between 7am to 6pm - Pager - [phone] After 6pm go to [email or web address] - password [email or web address]  [facility]  Office  [phone]  CC: Primary care physician; Marisue IvanLINTHAVONG, [name], MD  Note: This dictation was prepared with Dragon dictation along with smaller phrase technology. Any transcriptional errors that result from this process are unintentional.

## 2016-10-05 NOTE — Progress Notes (Signed)
Rapid response called for patient with decrease in Mental status.  Patient on BIPAP and O2 at home likely with Co2 retention ABG ordered CXR ordered Lactulose changed to PR Needs step down transfer   Time 28 minutes

## 2016-10-05 NOTE — Progress Notes (Signed)
Pt is drowsy this morning. He responds to voice, but falls asleep during conversation. CO2 has increased to 38 from last metabolic panel. Dr. Juliene PinaMody notified, will order ABG.

## 2016-10-05 NOTE — Progress Notes (Signed)
Pt transferred to room ICU 16, Felicia, RN is receiving nurse and was given bedside report. Pt's mother was at bedside at time of transfer. She is aware/agreeable to transfer and waiting in ICU waiting room. All belongings packed and transferred with patient to ICU. Pt placed on bipap by RT. VSS: 98.6 temp, 158/68 (93) BP, 81 HR, 19 RR, 97% SPO2 via bipap. Pt resting in bed on bipap, he is arousable to voice, remains oriented x 4, and is following commands.

## 2016-10-05 NOTE — Progress Notes (Signed)
Pt is more difficult to arouse than this AM. He opens his eyes to my voice, but will close them again almost immediately after. After sternal rub and multiple verbal cues pt is answering orientation questions correctly. He is oriented to self, place, and time. Charge nurse and Pt's mother is at bedside. Per pt's mother, this is not his normal demeanor. MD paged, and rapid response called for change in level of consciousness. VSS: BP 162/68 (89), HR 80, RR 19, SPO2 92% on 2L. Oxygen increased to 3L via [place].

## 2016-10-05 NOTE — Progress Notes (Addendum)
During rapid response, MD ordered ABG. Results came back as follows: FIO2 32.00, pH 7.30, pCO2 89, pO2 79, Acid-Base Excess 15.3, Bicarb 43.8, O2 Saturation 94.2, pt temp 37.0. RT updated MD, and order to place bipap and transfer to SD unit received.

## 2016-10-05 NOTE — Consult Note (Signed)
Referring Provider: Dr. Juliene Pina Primary Care Physician:  Marisue Ivan, MD Primary Gastroenterologist:  Dr. Mechele Collin  Reason for Consultation:  GI bleed; Cirrhosis; Ascites  HPI: Howard [name] is a 36 y.o. male with decompensated cirrhosis with ascites, encephalopathy, and a GI bleed. He has been having intermittent black stools for at least a few weeks (he is unable to tell me exactly how long) and last had black stools the day prior to admit. Occasional hematochezia. Denies hematemesis or vomiting. +Nausea with dry heaves. Has been having abdominal pain this week, but he cannot describe it. Ammonia 111. LFTs within normal limits. Hgb 7.6 on admit yesterday (8.1 on 09/23/16). INR 1.03. Drinks a 6 pack of beer per day and reports cutting down to 3-4 beers per day this week. Does NOT follow a low sodium diet. Has been noncompliant with his meds. On home oxygen and O2 sat was 88 % on room air.   History of esophageal varices with banding of Grade II varices in July 2017 (Dr. Mechele Collin). Surveillance EGD in Jan 2017 (Dr. Shelle Iron) and varices not banded at that time. as part of f/u of esophageal varices. Banding also done in November 2016 when he had melena (no stigmata noted) and banding done during surveillance in December 2016.  Past Medical History:  Diagnosis Date  . Alcoholic cirrhosis of liver with ascites (HCC)   . Anemia   . COPD (chronic obstructive pulmonary disease) (HCC)   . Depression   . Diabetes (HCC)   . Diabetes mellitus, type II (HCC)   . Esophageal varices (HCC)   . Heart disease   . Hyperlipemia   . Hypertension   . Liver disease   . Multiple thyroid nodules   . Portal hypertensive gastropathy   . Schizophrenia [facility])     Past Surgical History:  Procedure Laterality Date  . ESOPHAGOGASTRODUODENOSCOPY N/A 10/05/2015   Procedure: ESOPHAGOGASTRODUODENOSCOPY (EGD);  Surgeon: Elnita Maxwell, MD;  Location: [facility] ENDOSCOPY;  Service: Endoscopy;  Laterality: N/A;  .  ESOPHAGOGASTRODUODENOSCOPY (EGD) WITH PROPOFOL N/A 08/03/2015   Procedure: ESOPHAGOGASTRODUODENOSCOPY (EGD) WITH PROPOFOL;  Surgeon: Elnita Maxwell, MD;  Location[facility] ENDOSCOPY;  Service: Endoscopy;  Laterality: N/A;  . ESOPHAGOGASTRODUODENOSCOPY (EGD) WITH PROPOFOL N/A 08/31/2015   Procedure: ESOPHAGOGASTRODUODENOSCOPY (EGD) WITH PROPOFOL;  Surgeon: Elnita Maxwell, MD;  Location: [facility] ENDOSCOPY;  Service: Endoscopy;  Laterality: N/A;  . ESOPHAGOGASTRODUODENOSCOPY (EGD) WITH PROPOFOL N/A 04/04/2016   Procedure: ESOPHAGOGASTRODUODENOSCOPY (EGD) WITH PROPOFOL;  Surgeon: Scot Jun, MD;  Location: [facility] ENDOSCOPY;  Service: Endoscopy;  Laterality: N/A;  . NO PAST SURGERIES      Prior to Admission medications   Medication Sig Start Date End Date Taking? Authorizing Provider  acamprosate (CAMPRAL) 333 MG tablet Take 2 tablets (666 mg total) by mouth 3 (three) times daily with meals. 08/19/16  Yes Audery Amel, MD  citalopram (CELEXA) 20 MG tablet Take 1 tablet (20 mg total) by mouth every morning. 08/19/16  Yes Audery Amel, MD  furosemide (LASIX) 20 MG tablet Take 1 tablet (20 mg total) by mouth daily. 05/15/16  Yes Richard Renae Gloss, MD  lactulose (CHRONULAC) 10 GM/15ML solution Take 45 mLs (30 g total) by mouth every 4 (four) hours. 07/09/16  Yes Katharina Caper, MD  levETIRAcetam (KEPPRA) 1000 MG tablet Take 1 tablet (1,000 mg total) by mouth 2 (two) times daily. 07/09/16  Yes Katharina Caper, MD  losartan (COZAAR) 25 MG tablet Take 25 mg by mouth daily.   Yes Historical Provider, MD  metFORMIN (  GLUCOPHAGE) 500 MG tablet Take 1 tablet (500 mg total) by mouth 2 (two) times daily with a meal. 05/15/16  Yes Alford Highland, MD  modafinil (PROVIGIL) 100 MG tablet Take 1 tablet (100 mg total) by mouth daily. 08/13/16  Yes Auburn Bilberry, MD  Multiple Vitamin (MULTIVITAMIN WITH MINERALS) TABS tablet Take 1 tablet by mouth daily. 08/04/15  Yes Ramonita Lab, MD  nadolol (CORGARD) 20 MG tablet  Take 20 mg by mouth daily.   Yes Historical Provider, MD  OLANZapine (ZYPREXA) 15 MG tablet Take 2 tablets (30 mg total) by mouth at bedtime. 05/22/16  Yes Audery Amel, MD  omeprazole (PRILOSEC) 40 MG capsule Take 1 capsule (40 mg total) by mouth every morning. 02/12/16  Yes Enid Baas, MD  ondansetron (ZOFRAN) 4 MG tablet Take 1 tablet (4 mg total) by mouth every 8 (eight) hours as needed for nausea or vomiting. 08/26/16  Yes Governor Rooks, MD  rifaximin (XIFAXAN) 550 MG TABS tablet Take 1 tablet (550 mg total) by mouth 2 (two) times daily. 07/09/16  Yes Katharina Caper, MD  spironolactone (ALDACTONE) 50 MG tablet Take 1 tablet (50 mg total) by mouth 2 (two) times daily. 07/09/16  Yes Katharina Caper, MD  sucralfate (CARAFATE) 1 g tablet Take 1 g by mouth 4 (four) times daily.   Yes Historical Provider, MD    Scheduled Meds: . acamprosate  666 mg Oral TID WC  . ciprofloxacin  400 mg Intravenous Q12H  . citalopram  20 mg Oral [id number]  . folic acid  1 mg Oral Daily  . furosemide  20 mg Intravenous Q12H  . insulin aspart  0-5 Units Subcutaneous QHS  . insulin aspart  0-9 Units Subcutaneous TID WC  . lactulose  30 g Oral Q4H  . levETIRAcetam  1,000 mg Oral BID  . losartan  25 mg Oral Daily  . modafinil  100 mg Oral Daily  . multivitamin with minerals  1 tablet Oral Daily  . nadolol  20 mg Oral Daily  . octreotide  50 mcg Intravenous Once  . OLANZapine  30 mg Oral QHS  . pantoprazole (PROTONIX) IV  40 mg Intravenous Q12H  . rifaximin  550 mg Oral BID  . sodium chloride flush  3 mL Intravenous Q12H  . spironolactone  50 mg Oral BID  . sucralfate  1 g Oral QID  . thiamine  100 mg Oral Daily   Or  . thiamine  100 mg Intravenous Daily   Continuous Infusions: . octreotide  (SANDOSTATIN)    IV infusion     PRN Meds:.sodium chloride, acetaminophen **OR** acetaminophen, albuterol, LORazepam **OR** LORazepam, ondansetron **OR** ondansetron (ZOFRAN) IV, sodium chloride flush  Allergies as  of 10/04/2016  . (No Known Allergies)    Family History  Problem Relation Age of Onset  . Heart disease Mother   . Hypertension Mother   . Hyperlipidemia Mother   . Stroke Father   . Heart attack Father   . Hypertension Father   . Heart disease Father   . Alcohol abuse Father   . Heart disease Brother     Social History   Social History  . Marital status: Single    Spouse name: N/A  . Number of children: N/A  . Years of education: N/A   Occupational History  . disabled    Social History Main Topics  . Smoking status: Never Smoker  . Smokeless tobacco: Never Used  . Alcohol use 30.0 oz/week    50 Cans  of beer per week     Comment: last drink on Friday 12/29  . Drug use: No  . Sexual activity: No   Other Topics Concern  . Not on file   Social History Narrative   ** Merged History Encounter **        Review of Systems: All negative except as stated above in HPI.  Physical Exam: Vital signs: Vitals:   10/05/16 0419 10/05/16 0744  BP: (!) 168/69 (!) 158/71  Pulse: 78 90  Resp:  (!) 22  Temp: 98.1 F (36.7 C) 98.3 F (36.8 C)   Last BM Date: 10/04/16 General:  Lethargic, tremulous, morbidly obese Head: atraumatic Eyes: anicteric sclera ENT: oropharynx clear Neck: supple, nontender Lungs:  Clear throughout to auscultation.   No wheezes, crackles, or rhonchi. No acute distress. Heart:  Regular rate and rhythm; no murmurs, clicks, rubs,  or gallops. Abdomen: diffusely tender with guarding, +distention, +BS  Rectal:  Deferred Ext: 1+ LE edema Neuro: +asterixis; oriented X 3  GI:  Lab Results:  Recent Labs  10/04/16 1440 10/05/16 0514  WBC 8.0 6.8  HGB 7.6* 7.5*  HCT 25.7* 25.1*  PLT 144* 123*   BMET  Recent Labs  10/04/16 1440 10/05/16 0514  NA 140 143  K 4.7 4.3  CL 103 101  CO2 33* 38*  GLUCOSE 111* 132*  BUN 14 11  CREATININE 0.91 0.75  CALCIUM 8.6* 8.7*   LFT  Recent Labs  10/05/16 0514  PROT 7.2  ALBUMIN 3.0*  AST 37   ALT 24  ALKPHOS 114  BILITOT 0.6   PT/INR  Recent Labs  10/04/16 1440  LABPROT 13.5  INR 1.03     Studies/Results: Dg Chest 2 View  Result Date: 10/04/2016 CLINICAL DATA:  Nonsmoker. Pt is having SOB that started last night along with abdominal pain and distention. Pt has ETOH abuse. Hx of COPD, Diabetes, Heart disease, HTN, and Liver disease. EXAM: CHEST - 2 VIEW COMPARISON:  09/21/2016 FINDINGS: Low lung volumes with resultant crowding of bronchovascular structures. No confluent airspace disease or overt edema. Heart size and mediastinal contours are within normal limits. Blunting of posterior costophrenic angles suggesting small effusions. Regional bones unremarkable IMPRESSION: 1. Possible small pleural effusions. Otherwise stable appearance since previous exam. Electronically Signed   By: Corlis Leak M.D.   On: 10/04/2016 14:47   US Abdomen Limited  Result Date: 10/04/2016 CLINICAL DATA:  Abdominal distension EXAM: LIMITED ABDOMEN ULTRASOUND FOR ASCITES TECHNIQUE: Limited ultrasound survey for ascites was performed in all four abdominal quadrants. COMPARISON:  None. FINDINGS: Ascites is identified in the left upper quadrant and bilateral lower quadrants. The amount of ascites is new since September 23, 2016. The largest pocket in the right lower quadrant measures 3.8 cm. IMPRESSION: Ascites as above. Electronically Signed   By: Gerome Sam III M.D   On: 10/04/2016 18:52    Impression/Plan: Decompensated cirrhosis who has been having intermittent melenic stools at home that are likely due to portal hypertensive gastropathy. Having abdominal pain and signs of encephalopathy. His presentation is NOT consistent with a variceal bleed. He is high risk for spontaneous bacterial peritonitis and agree with paracentesis with cell count to check for SBP. MELD score low at 4. Agree with IV Cipro for GI bleed in the setting of cirrhosis. Agree with Octreotide drip and Protonix 40 mg IV Q 12 hours.  Continue Lactulose and Rifaximin. Will hold off on an EGD at this time unless he starts vomiting blood or  showing signs of hemodynamic instability with ongoing melena (no melena seen since admit).   He is NOT a candidate for a liver transplant right now due to ongoing alcohol abuse and talked with him about his need to abstain from alcohol (usually at least 6 months before a transplant center will consider a patient) and be compliant with a low sodium diet and his meds. He states that he told his mother that he is stopping alcohol as of now.     LOS: 0 days   Deion Swift C.  10/05/2016, 9:36 AM

## 2016-10-05 NOTE — Progress Notes (Signed)
Pt is ordered to take acamprosate (campral) TID with meals. I called pharmacy and the pharmacist stated it would be okay to give pt his medication even if pt is only on a clear liquid diet. Will administer medication.

## 2016-10-05 NOTE — Care Management Obs Status (Signed)
MEDICARE OBSERVATION STATUS NOTIFICATION   Patient Details  Name: Howard LinemanJoey A Ferg MRN: [id number] Date of Birth: 1981-07-28   Medicare Observation Status Notification Given:  No (converted to inpatient in less than 24 hours)    Caren MacadamMichelle Shamere Dilworth, RN 10/05/2016, 2:36 PM

## 2016-10-05 NOTE — Progress Notes (Signed)
Pt is due for paracentesis sometime today. Dr. Bosie ClosSchooler had asked the pt to not eat his breakfast prior to his procedure. Pt is concerned and asking why he can't eat this AM. I spoke to the U/S technician and she reported that some pts are able to eat prior to a paracentesis. I called Dr. Bosie ClosSchooler and he gave verbal order to put pt on clear liquid diet so that pt may have something to eat prior to his paracentesis.

## 2016-10-05 NOTE — Significant Event (Signed)
Rapid Response Event Note  Overview: Time Called: 1155 Event Type: Other (Comment), Neurologic (Altered Mental Status)  Initial Focused Assessment: Pt in bed, speech slurred, lethargic. Pt unable to maintain eye contact.  Pt is oriented to person, place and situation.   Interventions: ABG obtained by RT, Pt assessed by RR/ICU RN, ph 7.30, paco2 89. Pt rr 24, hr 90. o2 92 on 3L [place].   Plan of Care (if not transferred): Pt transferred to ICU to be placed on Bipap.  Event Summary: Name of Physician Notified: Mitzi HansenMoody at [id number] (Dr. Mitzi HansenMoody in pt room when RR RN arrived)    at    Outcome: Transferred (Comment) (Pt transferred to ICU)     Myrene GalasJamie C Smith Gregory

## 2016-10-06 ENCOUNTER — Telehealth: Payer: Self-pay

## 2016-10-06 DIAGNOSIS — K7031 Alcoholic cirrhosis of liver with ascites: Secondary | ICD-10-CM

## 2016-10-06 DIAGNOSIS — K7030 Alcoholic cirrhosis of liver without ascites: Secondary | ICD-10-CM

## 2016-10-06 DIAGNOSIS — I8510 Secondary esophageal varices without bleeding: Secondary | ICD-10-CM

## 2016-10-06 DIAGNOSIS — D62 Acute posthemorrhagic anemia: Secondary | ICD-10-CM

## 2016-10-06 DIAGNOSIS — E662 Morbid (severe) obesity with alveolar hypoventilation: Principal | ICD-10-CM

## 2016-10-06 LAB — BLOOD GAS, ARTERIAL
Acid-Base Excess: 18.8 mmol/L — ABNORMAL HIGH (ref 0.0–2.0)
Bicarbonate: 46.2 mmol/L — ABNORMAL HIGH (ref 20.0–28.0)
FIO2: 0.36
O2 Saturation: 91.1 %
PH ART: 7.37 (ref 7.350–7.450)
Patient temperature: 37
pCO2 arterial: 80 mmHg (ref 32.0–48.0)
pO2, Arterial: 63 mmHg — ABNORMAL LOW (ref 83.0–108.0)

## 2016-10-06 LAB — HEMOGLOBIN
HEMOGLOBIN: 7.4 g/dL — AB (ref 13.0–18.0)
Hemoglobin: 7.2 g/dL — ABNORMAL LOW (ref 13.0–18.0)

## 2016-10-06 LAB — BASIC METABOLIC PANEL
Anion gap: 7 (ref 5–15)
BUN: 13 mg/dL (ref 6–20)
CALCIUM: 9 mg/dL (ref 8.9–10.3)
CO2: 40 mmol/L — ABNORMAL HIGH (ref 22–32)
CREATININE: 0.93 mg/dL (ref 0.61–1.24)
Chloride: 98 mmol/L — ABNORMAL LOW (ref 101–111)
GFR calc Af Amer: >60 mL/min (ref >60)
Glucose, Bld: 101 mg/dL — ABNORMAL HIGH (ref 65–99)
POTASSIUM: 4.4 mmol/L (ref 3.5–5.1)
SODIUM: 145 mmol/L (ref 135–145)

## 2016-10-06 LAB — MRSA PCR SCREENING: MRSA BY PCR: NEGATIVE

## 2016-10-06 LAB — GLUCOSE, CAPILLARY
GLUCOSE-CAPILLARY: 110 mg/dL — AB (ref 65–99)
GLUCOSE-CAPILLARY: 78 mg/dL (ref 65–99)
Glucose-Capillary: 199 mg/dL — ABNORMAL HIGH (ref 65–99)
Glucose-Capillary: 89 mg/dL (ref 65–99)

## 2016-10-06 LAB — CBC
HCT: 23 % — ABNORMAL LOW (ref 40.0–52.0)
Hemoglobin: 6.8 g/dL — ABNORMAL LOW (ref 13.0–18.0)
MCH: 22.8 pg — ABNORMAL LOW (ref 26.0–34.0)
MCHC: 29.7 g/dL — ABNORMAL LOW (ref 32.0–36.0)
MCV: 76.8 fL — ABNORMAL LOW (ref 80.0–100.0)
PLATELETS: 129 10*3/uL — AB (ref 150–440)
RBC: 2.99 MIL/uL — AB (ref 4.40–5.90)
RDW: 18.2 % — AB (ref 11.5–14.5)
WBC: 6 10*3/uL (ref 3.8–10.6)

## 2016-10-06 LAB — PREPARE RBC (CROSSMATCH)

## 2016-10-06 LAB — AMMONIA: Ammonia: 94 umol/L — ABNORMAL HIGH (ref 9–35)

## 2016-10-06 LAB — OCCULT BLOOD X 1 CARD TO LAB, STOOL: Fecal Occult Bld: NEGATIVE

## 2016-10-06 MED ORDER — SODIUM CHLORIDE 0.9 % IV SOLN
Freq: Once | INTRAVENOUS | Status: AC
  Administered 2016-10-06: 16:00:00 via INTRAVENOUS

## 2016-10-06 MED ORDER — LACTULOSE 10 GM/15ML PO SOLN
30.0000 g | Freq: Three times a day (TID) | ORAL | Status: DC
  Administered 2016-10-06 – 2016-10-08 (×6): 30 g via ORAL
  Filled 2016-10-06 (×6): qty 60

## 2016-10-06 NOTE — Progress Notes (Signed)
Howard Miniumarren Emerald Shor, MD [facility]   [address] [place], [place] [address] Phone: [phone] Fax : [phone]   Subjective: This patient was admitted with a possible GI bleed a history of cirrhosis and ascites.  The patient has a history of esophageal varices and has had them banded by Dr. Mechele CollinElliott.  The patient was consult at for GI bleeding in a patient was found to have heme-negative stools and a very small amount of ascites.   Objective: Vital signs in last 24 hours: Vitals:   10/06/16 1600 10/06/16 1700 10/06/16 1800 10/06/16 1836  BP:  (!) 162/72 (!) 143/70 135/62  Pulse:  87 81 86  Resp:  (!) 26 15 18   Temp: 98.6 F (37 C)  98.3 F (36.8 C) 98.4 F (36.9 C)  TempSrc:      SpO2:  91% 93% 95%  Weight:      Height:       Weight change:   Intake/Output Summary (Last 24 hours) at 10/06/16 1844 Last data filed at 10/06/16 1836  Gross per 24 hour  Intake           920.58 ml  Output             2726 ml  Net         -1805.42 ml     Exam: Heart:: Regular rate and rhythm, S1S2 present or without murmur or extra heart sounds Lungs: normal and clear to auscultation Abdomen: soft, nontender, normal bowel sounds   Lab Results: @LABTEST2 @ Micro Results: Recent Results (from the past 240 hour(s))  MRSA PCR Screening     Status: None   Collection Time: 10/05/16 11:00 PM  Result Value Ref Range Status   MRSA by PCR NEGATIVE NEGATIVE Final    Comment:        The GeneXpert MRSA Assay (FDA approved for NASAL specimens only), is one component of a comprehensive MRSA colonization surveillance program. It is not intended to diagnose MRSA infection nor to guide or monitor treatment for MRSA infections.    Studies/Results: Dg Chest 1 View  Result Date: 10/05/2016 CLINICAL DATA:  Hypercarbia.  Lethargy. EXAM: CHEST 1 VIEW COMPARISON:  Two-view chest x-ray 10/04/2016 FINDINGS: Heart is enlarged. Moderate pulmonary vascular congestion and bilateral atelectasis is  slightly worse than on the prior exam. Small bilateral pleural effusions are stable. IMPRESSION: 1. Cardiomegaly and moderate pulmonary vascular congestion suggesting mild congestive heart failure. 2. Bibasilar airspace disease likely reflects atelectasis and small effusions. Electronically Signed   By: Marin Robertshristopher  Mattern M.D.   On: 10/05/2016 14:51   [place] Abdomen Limited  Result Date: 10/05/2016 CLINICAL DATA:  Please evaluate abdominal ascites for potential ultrasound-guided paracentesis. EXAM: LIMITED ABDOMEN ULTRASOUND FOR ASCITES TECHNIQUE: Limited ultrasound survey for ascites was performed in all four abdominal quadrants. COMPARISON:  Ascites search ultrasound - 10/04/2016; 09/23/2016; 05/12/2016 FINDINGS: Real-time ultrasound scanning of the abdomen supervised by the dictating interventional radiologist only demonstrates a trace amount of intra-abdominal ascites, not enough to allow for safe ultrasound-guided paracentesis. IMPRESSION: Trace amount of intra-abdominal ascites, not enough to allow for safe ultrasound-guided paracentesis. Electronically Signed   By: Simonne ComeJohn  Watts M.D.   On: 10/05/2016 11:14   Medications: I have reviewed the patient's current medications. Scheduled Meds: . acamprosate  666 mg Oral TID WC  . ciprofloxacin  400 mg Intravenous Q12H  . citalopram  20 mg Oral [id number]  . folic acid  1 mg Oral Daily  . furosemide  20 mg Intravenous Q12H  .  insulin aspart  0-5 Units Subcutaneous QHS  . insulin aspart  0-9 Units Subcutaneous TID WC  . lactulose  30 g Oral TID  . levETIRAcetam  1,000 mg Oral BID  . losartan  25 mg Oral Daily  . modafinil  100 mg Oral Daily  . multivitamin with minerals  1 tablet Oral Daily  . nadolol  20 mg Oral Daily  . OLANZapine  30 mg Oral QHS  . pantoprazole (PROTONIX) IV  40 mg Intravenous Q12H  . rifaximin  550 mg Oral BID  . sodium chloride flush  3 mL Intravenous Q12H  . spironolactone  50 mg Oral BID  . sucralfate  1 g Oral QID  .  thiamine  100 mg Oral Daily   Or  . thiamine  100 mg Intravenous Daily   Continuous Infusions: PRN Meds:.sodium chloride, acetaminophen **OR** acetaminophen, albuterol, LORazepam **OR** LORazepam, naLOXone (NARCAN)  injection, ondansetron **OR** ondansetron (ZOFRAN) IV, sodium chloride flush   Assessment: Active Problems:   Ascites   GIB (gastrointestinal bleeding)    Plan: This patient is being seen for his ascites and cirrhosis due to alcohol abuse and possible GI bleed.  The patient had stools that were heme negative which indicate that the patient is unlikely to have bled recently or actively bleeding.  The patient's ultrasound showed only trace ascites.  The volume was too small to even safely remove any of the fluid.  I would continue to treat the patient conservatively.  Nothing further to add from a GI point of view except abstinence from alcohol in the future.  The patient has been explaining this.   LOS: 1 day   Howard Minium 10/06/2016, 6:44 PM

## 2016-10-06 NOTE — Consult Note (Signed)
[facility] Consultation     ASSESSMENT/PLAN     PULMONARY A:Chronic hypercapnic and hypoxic respiratory failure, which appears be compensated at this time. I suspect this is likely secondary to obesity hypoventilation syndrome, this may be exacerbated by his ascites due to reduced lung volumes. At that time. P:   BiPAP daily at bedtime. Paracentesis as needed.  CARDIOVASCULAR A:  Echocardiogram 08/04/16: EF 65%. P:  --  RENAL A: -- P:   --  GASTROINTESTINAL A:  Alcoholic cirrhosis, with ascites. History of esophageal varices. P:   Paracentesis as needed. PPI.  HEMATOLOGIC A:  Microcytic anemia. P:  -Transfuse as needed.  INFECTIOUS A:  SBP prophylaxis. P:    Micro/culture results:  BCx2 -- UC -- Sputum--  Antibiotics:   ENDOCRINE A:  --   P:   --  NEUROLOGIC A:  Altered mental status secondary to hepatic encephalopathy and elevated CO2.  P:   Continue lactulose, rifaximin.    MAJOR EVENTS/TEST RESULTS:   Best Practices  DVT Prophylaxis: held GI Prophylaxis: PPI   ---------------------------------------  ---------------------------------------   Name: JIE STICKELS MRN: [id number] DOB: 08-Sep-1981    ADMISSION DATE:  10/04/2016 CONSULTATION DATE:  10/06/16  REFERRING MD :  Dr. Sheryle Hail  CHIEF COMPLAINT:  dyspnea   HISTORY OF PRESENT ILLNESS:   The patient is a 36 yo male with a history etoh abuse and cirrhosis. He was admitted to the hospital on 1/13 with dyspnea and progressive leg swelling.  He has a history of hepatic encephalopathy and varices. On admission, he was noted to have altered mental status, thought to be secondary to hepatic encephalopathy. He is on chronic oxygen at home. Overnight, the patient was transferred to the ICU after rapid response due to patient's lethargy. Review of his blood gases from this and previous admissions are consistent with chronic hypercapnic and hypoxic respiratory  failure, which appears to be compensated. His most recent blood gas this morning shows a pH 7.37/80/63/18.8.   PAST MEDICAL HISTORY :  Past Medical History:  Diagnosis Date  . Alcoholic cirrhosis of liver with ascites (HCC)   . Anemia   . COPD (chronic obstructive pulmonary disease) (HCC)   . Depression   . Diabetes (HCC)   . Diabetes mellitus, type II (HCC)   . Esophageal varices (HCC)   . Heart disease   . Hyperlipemia   . Hypertension   . Liver disease   . Multiple thyroid nodules   . Portal hypertensive gastropathy   . Schizophrenia [facility])    Past Surgical History:  Procedure Laterality Date  . ESOPHAGOGASTRODUODENOSCOPY N/A 10/05/2015   Procedure: ESOPHAGOGASTRODUODENOSCOPY (EGD);  Surgeon: Elnita Maxwell, MD;  Location: [facility] ENDOSCOPY;  Service: Endoscopy;  Laterality: N/A;  . ESOPHAGOGASTRODUODENOSCOPY (EGD) WITH PROPOFOL N/A 08/03/2015   Procedure: ESOPHAGOGASTRODUODENOSCOPY (EGD) WITH PROPOFOL;  Surgeon: Elnita Maxwell, MD;  Location: [facility] ENDOSCOPY;  Service: Endoscopy;  Laterality: N/A;  . ESOPHAGOGASTRODUODENOSCOPY (EGD) WITH PROPOFOL N/A 08/31/2015   Procedure: ESOPHAGOGASTRODUODENOSCOPY (EGD) WITH PROPOFOL;  Surgeon: Elnita Maxwell, MD;  Location[facility] ENDOSCOPY;  Service: Endoscopy;  Laterality: N/A;  . ESOPHAGOGASTRODUODENOSCOPY (EGD) WITH PROPOFOL N/A 04/04/2016   Procedure: ESOPHAGOGASTRODUODENOSCOPY (EGD) WITH PROPOFOL;  Surgeon: Scot Jun, MD;  Location: [facility] ENDOSCOPY;  Service: Endoscopy;  Laterality: N/A;  . NO PAST SURGERIES     Prior to Admission medications   Medication Sig Start Date End Date Taking? Authorizing Provider  acamprosate (CAMPRAL) 333 MG tablet Take 2 tablets (666 mg total)  by mouth 3 (three) times daily with meals. 08/19/16  Yes Audery Amel, MD  citalopram (CELEXA) 20 MG tablet Take 1 tablet (20 mg total) by mouth every morning. 08/19/16  Yes Audery Amel, MD  furosemide (LASIX) 20 MG tablet Take 1 tablet (20 mg  total) by mouth daily. 05/15/16  Yes Richard Renae Gloss, MD  lactulose (CHRONULAC) 10 GM/15ML solution Take 45 mLs (30 g total) by mouth every 4 (four) hours. 07/09/16  Yes Katharina Caper, MD  levETIRAcetam (KEPPRA) 1000 MG tablet Take 1 tablet (1,000 mg total) by mouth 2 (two) times daily. 07/09/16  Yes Katharina Caper, MD  losartan (COZAAR) 25 MG tablet Take 25 mg by mouth daily.   Yes Historical Provider, MD  metFORMIN (GLUCOPHAGE) 500 MG tablet Take 1 tablet (500 mg total) by mouth 2 (two) times daily with a meal. 05/15/16  Yes Alford Highland, MD  modafinil (PROVIGIL) 100 MG tablet Take 1 tablet (100 mg total) by mouth daily. 08/13/16  Yes Auburn Bilberry, MD  Multiple Vitamin (MULTIVITAMIN WITH MINERALS) TABS tablet Take 1 tablet by mouth daily. 08/04/15  Yes Ramonita Lab, MD  nadolol (CORGARD) 20 MG tablet Take 20 mg by mouth daily.   Yes Historical Provider, MD  OLANZapine (ZYPREXA) 15 MG tablet Take 2 tablets (30 mg total) by mouth at bedtime. 05/22/16  Yes Audery Amel, MD  omeprazole (PRILOSEC) 40 MG capsule Take 1 capsule (40 mg total) by mouth every morning. 02/12/16  Yes Enid Baas, MD  ondansetron (ZOFRAN) 4 MG tablet Take 1 tablet (4 mg total) by mouth every 8 (eight) hours as needed for nausea or vomiting. 08/26/16  Yes Governor Rooks, MD  rifaximin (XIFAXAN) 550 MG TABS tablet Take 1 tablet (550 mg total) by mouth 2 (two) times daily. 07/09/16  Yes Katharina Caper, MD  spironolactone (ALDACTONE) 50 MG tablet Take 1 tablet (50 mg total) by mouth 2 (two) times daily. 07/09/16  Yes Katharina Caper, MD  sucralfate (CARAFATE) 1 g tablet Take 1 g by mouth 4 (four) times daily.   Yes Historical Provider, MD   No Known Allergies  FAMILY HISTORY:  Family History  Problem Relation Age of Onset  . Heart disease Mother   . Hypertension Mother   . Hyperlipidemia Mother   . Stroke Father   . Heart attack Father   . Hypertension Father   . Heart disease Father   . Alcohol abuse Father   . Heart  disease Brother    SOCIAL HISTORY:  reports that he has never smoked. He has never used smokeless tobacco. He reports that he drinks about 30.0 oz of alcohol per week . He reports that he does not use drugs.  REVIEW OF SYSTEMS:   Constitutional: Feels well. Cardiovascular: No chest pain.  Pulmonary: Denies dyspnea.   The remainder of systems were reviewed and were found to be negative other than what is documented in the HPI.    VITAL SIGNS: Temp:  [97.8 F (36.6 C)-99 F (37.2 C)] 97.8 F (36.6 C) (01/15 0700) Pulse Rate:  [71-88] 84 (01/15 0900) Resp:  [13-24] 18 (01/15 0900) BP: (100-164)/(33-89) 138/66 (01/15 0900) SpO2:  [88 %-98 %] 93 % (01/15 0900) FiO2 (%):  [35 %] 35 % (01/15 0900) HEMODYNAMICS:   VENTILATOR SETTINGS: FiO2 (%):  [35 %] 35 % INTAKE / OUTPUT:  Intake/Output Summary (Last 24 hours) at 10/06/16 1236 Last data filed at 10/06/16 0900  Gross per 24 hour  Intake  955.58 ml  Output             2101 ml  Net         -1145.42 ml    Physical Examination:   VS: BP 138/66   Pulse 84   Temp 97.8 F (36.6 C)   Resp 18   Ht 6\' 3"  (1.905 m)   Wt (!) 381 lb 11.2 oz (173.1 kg)   SpO2 93%   BMI 47.71 kg/m   General Appearance: No distress  Neuro:without focal findings, mental status, speech normal,. HEENT: PERRLA, EOM intact, no ptosis, no other lesions noticed;  Pulmonary: normal breath sounds., diaphragmatic excursion normal. CardiovascularNormal S1,S2.  No m/r/g.    Abdomen: Benign, Soft, non-tender, No masses, hepatosplenomegaly, No lymphadenopathy Renal:  No costovertebral tenderness  GU:  Not performed at this time. Endoc: No evident thyromegaly, no signs of acromegaly. Skin:   warm, no rashes, no ecchymosis  Extremities: normal, no cyanosis, clubbing, no edema, warm with normal capillary refill.    LABS: Reviewed   LABORATORY PANEL:   CBC  Recent Labs Lab 10/06/16 0457  WBC 6.0  HGB 6.8*  HCT 23.0*  PLT 129*     Chemistries   Recent Labs Lab 10/05/16 0514 10/06/16 0457  NA 143 145  K 4.3 4.4  CL 101 98*  CO2 38* 40*  GLUCOSE 132* 101*  BUN 11 13  CREATININE 0.75 0.93  CALCIUM 8.7* 9.0  AST 37  --   ALT 24  --   ALKPHOS 114  --   BILITOT 0.6  --      Recent Labs Lab 10/05/16 0730 10/05/16 1134 10/05/16 1637 10/05/16 2247 10/06/16 0728 10/06/16 1151  GLUCAP 152* 143* 127* 114* 110* 199*    Recent Labs Lab 10/05/16 1154 10/06/16 1142  PHART 7.30* 7.37  PCO2ART 89* 80*  PO2ART 79* 63*    Recent Labs Lab 10/04/16 1440 10/05/16 0514  AST 39 37  ALT 26 24  ALKPHOS 123 114  BILITOT 0.6 0.6  ALBUMIN 3.1* 3.0*    Cardiac Enzymes  Recent Labs Lab 10/04/16 1440  TROPONINI <0.03    RADIOLOGY:  Dg Chest 1 View  Result Date: 10/05/2016 CLINICAL DATA:  Hypercarbia.  Lethargy. EXAM: CHEST 1 VIEW COMPARISON:  Two-view chest x-ray 10/04/2016 FINDINGS: Heart is enlarged. Moderate pulmonary vascular congestion and bilateral atelectasis is slightly worse than on the prior exam. Small bilateral pleural effusions are stable. IMPRESSION: 1. Cardiomegaly and moderate pulmonary vascular congestion suggesting mild congestive heart failure. 2. Bibasilar airspace disease likely reflects atelectasis and small effusions. Electronically Signed   By: Marin Robertshristopher  Mattern M.D.   On: 10/05/2016 14:51   Dg Chest 2 View  Result Date: 10/04/2016 CLINICAL DATA:  Nonsmoker. Pt is having SOB that started last night along with abdominal pain and distention. Pt has ETOH abuse. Hx of COPD, Diabetes, Heart disease, HTN, and Liver disease. EXAM: CHEST - 2 VIEW COMPARISON:  09/21/2016 FINDINGS: Low lung volumes with resultant crowding of bronchovascular structures. No confluent airspace disease or overt edema. Heart size and mediastinal contours are within normal limits. Blunting of posterior costophrenic angles suggesting small effusions. Regional bones unremarkable IMPRESSION: 1. Possible small  pleural effusions. Otherwise stable appearance since previous exam. Electronically Signed   By: Corlis Leak  Hassell M.D.   On: 10/04/2016 14:47   [place] Abdomen Limited  Result Date: 10/05/2016 CLINICAL DATA:  Please evaluate abdominal ascites for potential ultrasound-guided paracentesis. EXAM: LIMITED ABDOMEN ULTRASOUND FOR ASCITES TECHNIQUE: Limited ultrasound survey  for ascites was performed in all four abdominal quadrants. COMPARISON:  Ascites search ultrasound - 10/04/2016; 09/23/2016; 05/12/2016 FINDINGS: Real-time ultrasound scanning of the abdomen supervised by the dictating interventional radiologist only demonstrates a trace amount of intra-abdominal ascites, not enough to allow for safe ultrasound-guided paracentesis. IMPRESSION: Trace amount of intra-abdominal ascites, not enough to allow for safe ultrasound-guided paracentesis. Electronically Signed   By: Simonne Come M.D.   On: 10/05/2016 11:14   US Abdomen Limited  Result Date: 10/04/2016 CLINICAL DATA:  Abdominal distension EXAM: LIMITED ABDOMEN ULTRASOUND FOR ASCITES TECHNIQUE: Limited ultrasound survey for ascites was performed in all four abdominal quadrants. COMPARISON:  None. FINDINGS: Ascites is identified in the left upper quadrant and bilateral lower quadrants. The amount of ascites is new since September 23, 2016. The largest pocket in the right lower quadrant measures 3.8 cm. IMPRESSION: Ascites as above. Electronically Signed   By: Gerome Sam III M.D   On: 10/04/2016 18:52       --Wells Guiles, MD.  Board Certified in Internal Medicine, Pulmonary Medicine, Critical Care Medicine, and Sleep Medicine.  ICU Pager [phone] [place] [facility] Office Number: [phone]  Santiago Glad, M.D.  Stephanie Acre, M.D.  Billy Fischer, M.D   10/06/2016, 12:36 PM   Critical Care Attestation.  I have personally obtained a history, examined the patient, evaluated laboratory and imaging results, formulated the  assessment and plan and placed orders. The Patient requires high complexity decision making for assessment and support, frequent evaluation and titration of therapies, application of advanced monitoring technologies and extensive interpretation of multiple databases. The patient has critical illness that could lead imminently to failure of 1 or more organ systems and requires the highest level of physician preparedness to intervene.  Critical Care Time devoted to patient care services described in this note is 45 minutes and is exclusive of time spent in procedures.

## 2016-10-06 NOTE — Progress Notes (Signed)
Patient states he is schizophrenic and on disability. Does not want TV on. Wants not bight lights . Is not concerned about anything.

## 2016-10-06 NOTE — Progress Notes (Signed)
[employer] [facility] [place] [facility]   PATIENT NAME: Howard BowlerJoey Greaves    MR#:  [id number]  DATE OF BIRTH:  1980/12/18  SUBJECTIVE:   Patient is here due to shortness of breath. He was transferred to the ICU placed on BiPAP due to acute on chronic respiratory failure with hypoxia and hypercapnia. Now off the BiPAP. ABG this a.m. showing hypercapnia but normal pH. Noted to have heme negative stools. Mother at bedside.   REVIEW OF SYSTEMS:    Review of Systems  Constitutional: Negative for chills and fever.  HENT: Negative for congestion and tinnitus.   Eyes: Negative for blurred vision and double vision.  Respiratory: Negative for cough, shortness of breath and wheezing.   Cardiovascular: Negative for chest pain, orthopnea and PND.  Gastrointestinal: Negative for abdominal pain, diarrhea, nausea and vomiting.  Genitourinary: Negative for dysuria and hematuria.  Neurological: Negative for dizziness, sensory change and focal weakness.  All other systems reviewed and are negative.   Nutrition: heart healthy/Carb modified Tolerating Diet: Yes  Tolerating PT: Await Eval.    DRUG ALLERGIES:  No Known Allergies  VITALS:  Blood pressure (!) 122/57, pulse 77, temperature 98.6 F (37 C), resp. rate 19, height 6\' 3"  (1.905 m), weight (!) 173.1 kg (381 lb 11.2 oz), SpO2 95 %.  PHYSICAL EXAMINATION:   Physical Exam  GENERAL:  36 y.o.-year-old morbidly obese patient lying in the bed in no acute distress.  EYES: Pupils equal, round, reactive to light and accommodation. No scleral icterus. Extraocular muscles intact.  HEENT: Head atraumatic, normocephalic. Oropharynx and nasopharynx clear.  NECK:  Supple, no jugular venous distention. No thyroid enlargement, no tenderness.  LUNGS: Normal breath sounds bilaterally, no wheezing, rales, rhonchi. No use of accessory muscles of respiration.  CARDIOVASCULAR: S1, S2 normal. No murmurs, rubs, or gallops.  ABDOMEN: Soft, nontender,  nondistended. Bowel sounds present. No organomegaly or mass.  EXTREMITIES: No cyanosis, clubbing, +1-2 edema b/l  NEUROLOGIC: Cranial nerves II through XII are intact. No focal Motor or sensory deficits b/l.   PSYCHIATRIC: The patient is alert and oriented x 3.  SKIN: No obvious rash, lesion, or ulcer.    LABORATORY PANEL:   CBC  Recent Labs Lab 10/06/16 0457 10/06/16 1250  WBC 6.0  --   HGB 6.8* 7.2*  HCT 23.0*  --   PLT 129*  --    ------------------------------------------------------------------------------------------------------------------  Chemistries   Recent Labs Lab 10/05/16 0514 10/06/16 0457  NA 143 145  K 4.3 4.4  CL 101 98*  CO2 38* 40*  GLUCOSE 132* 101*  BUN 11 13  CREATININE 0.75 0.93  CALCIUM 8.7* 9.0  AST 37  --   ALT 24  --   ALKPHOS 114  --   BILITOT 0.6  --    ------------------------------------------------------------------------------------------------------------------  Cardiac Enzymes  Recent Labs Lab 10/04/16 1440  TROPONINI <0.03   ------------------------------------------------------------------------------------------------------------------  RADIOLOGY:  Dg Chest 1 View  Result Date: 10/05/2016 CLINICAL DATA:  Hypercarbia.  Lethargy. EXAM: CHEST 1 VIEW COMPARISON:  Two-view chest x-ray 10/04/2016 FINDINGS: Heart is enlarged. Moderate pulmonary vascular congestion and bilateral atelectasis is slightly worse than on the prior exam. Small bilateral pleural effusions are stable. IMPRESSION: 1. Cardiomegaly and moderate pulmonary vascular congestion suggesting mild congestive heart failure. 2. Bibasilar airspace disease likely reflects atelectasis and small effusions. Electronically Signed   By: Marin Robertshristopher  Mattern M.D.   On: 10/05/2016 14:51   [place] Abdomen Limited  Result Date: 10/05/2016 CLINICAL DATA:  Please evaluate abdominal ascites  for potential ultrasound-guided paracentesis. EXAM: LIMITED ABDOMEN ULTRASOUND FOR ASCITES  TECHNIQUE: Limited ultrasound survey for ascites was performed in all four abdominal quadrants. COMPARISON:  Ascites search ultrasound - 10/04/2016; 09/23/2016; 05/12/2016 FINDINGS: Real-time ultrasound scanning of the abdomen supervised by the dictating interventional radiologist only demonstrates a trace amount of intra-abdominal ascites, not enough to allow for safe ultrasound-guided paracentesis. IMPRESSION: Trace amount of intra-abdominal ascites, not enough to allow for safe ultrasound-guided paracentesis. Electronically Signed   By: Simonne Come M.D.   On: 10/05/2016 11:14   US Abdomen Limited  Result Date: 10/04/2016 CLINICAL DATA:  Abdominal distension EXAM: LIMITED ABDOMEN ULTRASOUND FOR ASCITES TECHNIQUE: Limited ultrasound survey for ascites was performed in all four abdominal quadrants. COMPARISON:  None. FINDINGS: Ascites is identified in the left upper quadrant and bilateral lower quadrants. The amount of ascites is new since September 23, 2016. The largest pocket in the right lower quadrant measures 3.8 cm. IMPRESSION: Ascites as above. Electronically Signed   By: Gerome Sam III M.D   On: 10/04/2016 18:52     ASSESSMENT AND PLAN:   36 year old male with past medical history of alcohol abuse, COPD, obstructive sleep apnea who presented to the hospital due to shortness of breath.   1. Acute on chronic respiratory failure with hypoxia and hypercapnia-secondary to sleep apnea, obesity pickwickian syndrome, COPD -Continue BiPAP as needed and patient is clinically improved.  -Abdominal ultrasound showing trace amount of ascites. Cont. Albuterol nebs PRN  2. COPD - no acute exacerbation.  - cont. Albuterol nebs as needed.   3 hepatic encephalopathy-ammonia level elevated to 94 today. Continue lactulose, Xifaxan follow ammonia and mental status.  4. Suspected GI bleed-patient's stools are actually heme negative. -Hemoglobin did drop to 68 and will transfuse 1 units.  Pt. Has hx of  esophogeal varices.  Cont. Nadolol - seen by GI and no plans for acute intervention. Cont. PPI BID.   5. DM - cont. SSI. BS stable.   6. ETOh abuse - cont. CIWA - cont. Folic acid, Thiamine  7. Depression - cont. Celexa.    8. Hx of Schizophrenia - cont. Zyprexa.   All the records are reviewed and case discussed with Care Management/Social Worker. Management plans discussed with the patient, family and they are in agreement.  CODE STATUS: full code  DVT Prophylaxis: Ted's SCD's  TOTAL TIME TAKING CARE OF THIS PATIENT: 30 minutes.   POSSIBLE D/C IN 2-3 DAYS, DEPENDING ON CLINICAL CONDITION.   Houston Siren M.D on 10/06/2016 at 5:05 PM  Between 7am to 6pm - Pager - [phone]  After 6pm go to [email or web address] - Social research officer, government  [employer] [facility]  Office  [phone]  CC: Primary care physician; Marisue Ivan, MD

## 2016-10-06 NOTE — Telephone Encounter (Signed)
Melinda from [facility] called states that pt is drinking because he can not sleep at night wanted to know if something can be called into pharmacy to help patient sleep.  and pt also would like to find out if he can talk to a therapist?

## 2016-10-06 NOTE — Progress Notes (Signed)
1820 Report called to 1A Luretha MurphyWanette Miles RN

## 2016-10-06 NOTE — Progress Notes (Signed)
Dr. Sheryle Hailiamond made aware of a Hemoglobin of 6.8. No new orders at this time. Will report lab to dayshift RN

## 2016-10-07 ENCOUNTER — Other Ambulatory Visit: Payer: Self-pay | Admitting: Psychiatry

## 2016-10-07 LAB — GLUCOSE, CAPILLARY
Glucose-Capillary: 81 mg/dL (ref 65–99)
Glucose-Capillary: 151 mg/dL — ABNORMAL HIGH (ref 65–99)
Glucose-Capillary: 93 mg/dL (ref 65–99)
Glucose-Capillary: 90 mg/dL (ref 65–99)

## 2016-10-07 LAB — AMMONIA: AMMONIA: 97 umol/L — AB (ref 9–35)

## 2016-10-07 LAB — TYPE AND SCREEN
Blood Product Expiration Date: 201801282359
ISSUE DATE / TIME: 201801151535
UNIT TYPE AND RH: 5100

## 2016-10-07 LAB — HEMOGLOBIN
HEMOGLOBIN: 7.5 g/dL — AB (ref 13.0–18.0)
Hemoglobin: 7.2 g/dL — ABNORMAL LOW (ref 13.0–18.0)
Hemoglobin: 7.4 g/dL — ABNORMAL LOW (ref 13.0–18.0)

## 2016-10-07 MED ORDER — TRAZODONE HCL 100 MG PO TABS: 100.0000 mg | ORAL_TABLET | Freq: Every evening | ORAL | 1 refills | Status: DC | PRN

## 2016-10-07 NOTE — Care Management Important Message (Signed)
Important Message  Patient Details  Name: Howard [name] MRN: [id number] Date of Birth: 1981-08-31   Medicare Important Message Given:  Yes    [name], RN 10/07/2016, 9:09 AM

## 2016-10-07 NOTE — Telephone Encounter (Signed)
She should be aware that in patients who are drinking regularly at night, it is actually usually the drinking itself that is driving the insomnia. Adding medicine for sleep is very unlikely to change his drinking habits which themselves are the underlying problem. Nevertheless, I would be willing to call in a modest dose of trazodone. I don't want to call in anything much stronger because with his drinking it could put him at increased risk of respiratory depression. Patient certainly can see a therapist here and an appointment can be made any time and I am always willing to see him in the office if it is helpful. I will put in an order for 100 mg of trazodone at night which may help with his sleep

## 2016-10-07 NOTE — Progress Notes (Signed)
[facility]   PATIENT NAME: Howard BowlerJoey [name]    MR#:  [id number]  DATE OF BIRTH:  [birth year]  SUBJECTIVE:   Required bipap at night for sleep and now off it during day. Remains somewhat lethargic but Alert and oriented. No other complaints. Hg. Stable.    REVIEW OF SYSTEMS:    Review of Systems  Constitutional: Negative for chills and fever.  HENT: Negative for congestion and tinnitus.   Eyes: Negative for blurred vision and double vision.  Respiratory: Negative for cough, shortness of breath and wheezing.   Cardiovascular: Negative for chest pain, orthopnea and PND.  Gastrointestinal: Negative for abdominal pain, diarrhea, nausea and vomiting.  Genitourinary: Negative for dysuria and hematuria.  Neurological: Negative for dizziness, sensory change and focal weakness.  All other systems reviewed and are negative.   Nutrition: heart healthy/Carb modified Tolerating Diet: Yes  Tolerating PT: Await Eval.    DRUG ALLERGIES:  No Known Allergies  VITALS:  Blood pressure 129/72, pulse 82, temperature 97.7 F (36.5 C), temperature source Oral, resp. rate 16, height 6\' 3"  (1.905 m), weight (!) 173.1 kg (381 lb 11.2 oz), SpO2 91 %.  PHYSICAL EXAMINATION:   Physical Exam  GENERAL:  36 y.o.-year-old morbidly obese patient lying in the bed lethargic but in NAD.  EYES: Pupils equal, round, reactive to light and accommodation. No scleral icterus. Extraocular muscles intact.  HEENT: Head atraumatic, normocephalic. Oropharynx and nasopharynx clear.  NECK:  Supple, no jugular venous distention. No thyroid enlargement, no tenderness.  LUNGS: Normal breath sounds bilaterally, no wheezing, rales, rhonchi. No use of accessory muscles of respiration.  CARDIOVASCULAR: S1, S2 normal. No murmurs, rubs, or gallops.  ABDOMEN: Soft, nontender, nondistended. Bowel sounds present. No organomegaly or mass.  EXTREMITIES: No cyanosis, clubbing, +1-2 edema b/l   NEUROLOGIC: Cranial nerves II through XII are intact. No focal Motor or sensory deficits b/l.  Globally weak.  PSYCHIATRIC: The patient is alert and oriented x 3.  SKIN: No obvious rash, lesion, or ulcer.    LABORATORY PANEL:   CBC  Recent Labs Lab 10/06/16 0457  10/07/16 1411  WBC 6.0  --   --   HGB 6.8*  < > 7.5*  HCT 23.0*  --   --   PLT 129*  --   --   < > = values in this interval not displayed. ------------------------------------------------------------------------------------------------------------------  Chemistries   Recent Labs Lab 10/05/16 0514 10/06/16 0457  NA 143 145  K 4.3 4.4  CL 101 98*  CO2 38* 40*  GLUCOSE 132* 101*  BUN 11 13  CREATININE 0.75 0.93  CALCIUM 8.7* 9.0  AST 37  --   ALT 24  --   ALKPHOS 114  --   BILITOT 0.6  --    ------------------------------------------------------------------------------------------------------------------  Cardiac Enzymes  Recent Labs Lab 10/04/16 1440  TROPONINI <0.03   ------------------------------------------------------------------------------------------------------------------  RADIOLOGY:  No results found.   ASSESSMENT AND PLAN:   36 year old male with past medical history of alcohol abuse, COPD, obstructive sleep apnea who presented to the hospital due to shortness of breath.   1. Acute on chronic respiratory failure with hypoxia and hypercapnia-secondary to sleep apnea, obesity pickwickian syndrome, COPD -Continue BiPAP at bedtime.   -Abdominal ultrasound showing trace amount of ascites and no need for Paracentesis -  Cont. Albuterol nebs PRN  2. COPD - no acute exacerbation.  - cont. Albuterol nebs as needed.   3 hepatic encephalopathy-ammonia level elevated to 94 yesterday.  -  Continue lactulose, Xifaxan and mental status improving. Repeat level in a.m.   4. Suspected GI bleed-patient's stools are actually heme negative. -Hemoglobin did drop to 6.8 yesterday and transfused 1  units and post transfusion pt's Hg. Has improved.   Pt. Has hx of esophogeal varices.  Cont. Nadolol - seen by GI and no plans for acute intervention. Cont. PPI BID.   5. DM - cont. SSI. BS stable.   6. ETOh abuse - cont. CIWA.  - cont. Folic acid, Thiamine  7. Depression - cont. Celexa.    8. Hx of Schizophrenia - cont. Zyprexa.   Await PT eval.   All the records are reviewed and case discussed with Care Management/Social Worker. Management plans discussed with the patient, family and they are in agreement.  CODE STATUS: full code  DVT Prophylaxis: Ted's SCD's  TOTAL TIME TAKING CARE OF THIS PATIENT: 25 minutes.   POSSIBLE D/C IN 1-2 DAYS, DEPENDING ON CLINICAL CONDITION.   Houston Siren M.D on 10/07/2016 at 4:48 PM  Between 7am to 6pm - Pager - [phone]  After 6pm go to [email or web address] - Social research officer, government  [facility]  Office  [phone]  CC: Primary care physician; Marisue Ivan, MD

## 2016-10-07 NOTE — Telephone Encounter (Signed)
left message for her to call office. a message was left that patient needed to call and make appt to see therapist and that a rx for trazodone was sent in.

## 2016-10-07 NOTE — Care Management (Signed)
Admitted to [facility] with the diagnosis of ascites. Discharged from this facility 09/23/16. Lives with mother Kathie RhodesBetty. [phone]). Last was in Dr. Sherryll BurgerLinthavong's office 08/19/16. Last see Dr. Malvin JohnsPotter 10/01/16. Chronic home oxygen per [facility]. [facility] is in the home now for Nursing and physical therapy. Rolling walker, cane, and electric wheelchair in the home.  Family will transport. Gwenette GreetBrenda S Joesphine Schemm RN MSN CCM Care Management

## 2016-10-07 NOTE — Progress Notes (Signed)
Howard Miniumarren Shean Gerding, MD [facility]   [address] [place], [place] [address] Phone: [phone] Fax : [phone]   Subjective: The patient is sleeping comfortably this morning without any complaints. The patient's hemoglobin remains low without any sign of any GI bleeding. The patient has a history of cirrhosis and esophageal varices with his most recent stools being heme negative. The patient also had an ultrasound that did show only a small amount of ascites. Patient's mother in the room.I have reviewed the patient's current medications.   Objective: Vital signs in last 24 hours: Vitals:   10/06/16 2036 10/06/16 2123 10/07/16 0514 10/07/16 0944  BP: 131/61  122/62 125/61  Pulse: 78  78 84  Resp: 20  18 20   Temp: 98.1 F (36.7 C)  97.6 F (36.4 C)   TempSrc: Oral  Oral   SpO2: 92% 93% 92% (!) 88%  Weight:      Height:       Weight change:   Intake/Output Summary (Last 24 hours) at 10/07/16 1214 Last data filed at 10/07/16 1005  Gross per 24 hour  Intake              640 ml  Output             1525 ml  Net             -885 ml     Exam: Sleeping comfortably in no apparent distress  Lab Results: @LABTEST2 @ Micro Results: Recent Results (from the past 240 hour(s))  MRSA PCR Screening     Status: None   Collection Time: 10/05/16 11:00 PM  Result Value Ref Range Status   MRSA by PCR NEGATIVE NEGATIVE Final    Comment:        The GeneXpert MRSA Assay (FDA approved for NASAL specimens only), is one component of a comprehensive MRSA colonization surveillance program. It is not intended to diagnose MRSA infection nor to guide or monitor treatment for MRSA infections.    Studies/Results: No results found. Medications: I have reviewed the patient's current medications. Scheduled Meds: . acamprosate  666 mg Oral TID WC  . ciprofloxacin  400 mg Intravenous Q12H  . citalopram  20 mg Oral BH-q7a  . folic acid  1 mg Oral Daily  . furosemide  20 mg Intravenous Q12H    . insulin aspart  0-5 Units Subcutaneous QHS  . insulin aspart  0-9 Units Subcutaneous TID WC  . lactulose  30 g Oral TID  . levETIRAcetam  1,000 mg Oral BID  . losartan  25 mg Oral Daily  . modafinil  100 mg Oral Daily  . multivitamin with minerals  1 tablet Oral Daily  . nadolol  20 mg Oral Daily  . OLANZapine  30 mg Oral QHS  . pantoprazole (PROTONIX) IV  40 mg Intravenous Q12H  . rifaximin  550 mg Oral BID  . sodium chloride flush  3 mL Intravenous Q12H  . spironolactone  50 mg Oral BID  . sucralfate  1 g Oral QID  . thiamine  100 mg Oral Daily   Or  . thiamine  100 mg Intravenous Daily   Continuous Infusions: PRN Meds:.sodium chloride, acetaminophen **OR** acetaminophen, albuterol, LORazepam **OR** LORazepam, naLOXone (NARCAN)  injection, ondansetron **OR** ondansetron (ZOFRAN) IV, sodium chloride flush   Assessment: Active Problems:   Ascites   GIB (gastrointestinal bleeding)    Plan: This patient has alcoholic cirrhosis with portal hypertension and anemia. The patient's hemoglobin has been low and  his stools have been negative. There is no indication for any GI procedures at this time.   LOS: 2 days   Howard [name] 10/07/2016, 12:14 PM

## 2016-10-08 LAB — CBC
HEMATOCRIT: 24.6 % — AB (ref 40.0–52.0)
HEMOGLOBIN: 7.4 g/dL — AB (ref 13.0–18.0)
MCH: 22.8 pg — ABNORMAL LOW (ref 26.0–34.0)
MCHC: 30.2 g/dL — ABNORMAL LOW (ref 32.0–36.0)
MCV: 75.7 fL — ABNORMAL LOW (ref 80.0–100.0)
Platelets: 118 10*3/uL — ABNORMAL LOW (ref 150–440)
RBC: 3.25 MIL/uL — AB (ref 4.40–5.90)
RDW: 17.5 % — ABNORMAL HIGH (ref 11.5–14.5)
WBC: 5.2 10*3/uL (ref 3.8–10.6)

## 2016-10-08 LAB — GLUCOSE, CAPILLARY
GLUCOSE-CAPILLARY: 80 mg/dL (ref 65–99)
GLUCOSE-CAPILLARY: 158 mg/dL — AB (ref 65–99)

## 2016-10-08 LAB — AMMONIA: AMMONIA: 60 umol/L — AB (ref 9–35)

## 2016-10-08 MED ORDER — PANTOPRAZOLE SODIUM 40 MG PO TBEC: 40.0000 mg | DELAYED_RELEASE_TABLET | Freq: Two times a day (BID) | ORAL | 0 refills | Status: DC

## 2016-10-08 MED ORDER — ALBUTEROL SULFATE HFA 108 (90 BASE) MCG/ACT IN AERS: 2.0000 | INHALATION_SPRAY | Freq: Four times a day (QID) | RESPIRATORY_TRACT | 2 refills | Status: DC | PRN

## 2016-10-08 NOTE — Care Management (Signed)
Received telephone call from Feliberto GottronJason Hinton, [facility] representative. Unable to continue services in the home.  Serena CroissantBrenda S. Marcelle OverlieHolland RN MSN CCM Care Management

## 2016-10-08 NOTE — Progress Notes (Signed)
Clinical Social Worker (CSW) briefly met with patient and his mother Howard [name] before patient discharged home today. Mother and patient reported no needs at this time. CSW provided mother with a list of [place] resources including substance abuse, housing and and food assistance.   [employer], LCSW [phone]

## 2016-10-08 NOTE — Plan of Care (Signed)
MD making rounds. Received order to discharge home. IV's removed. Patient provided with Education Handout. Prescription given to patient. Discharge paperwork provided, explained, signed and witnessed. No unanswered questions. Discharged via wheelchair by nursing staff. Belongings sent with patient and family.

## 2016-10-08 NOTE — Discharge Instructions (Addendum)
° °  Alcoholic Liver Disease Introduction Alcoholic liver disease happens when the liver does not work the way it should. The condition is caused by drinking too much alcohol for many years. Follow these instructions at home:  Do not drink alcohol.  Take medicines only as told by your doctor.  Take vitamins only as told by your doctor.  Follow any diet instructions that your doctor gave you. You may need to:  Eat foods that have thiamine. These include whole-wheat cereals, pork, and raw vegetables.  Eat foods that have folic acid. These include vegetables, fruits, meats, beans, nuts, and dairy foods.  Eat foods that are high in carbohydrates. These include yogurt, beans, potatoes, and rice. Contact a doctor if:  You have a fever.  You are short of breath.  You have trouble breathing.  You have bright red blood in your poop (stool).  Your poop looks like tar.  You throw up (vomit) blood.  Your skin looks more yellow, pale, or dark.  You get headaches.  You have trouble thinking.  You have trouble balancing or walking. This information is not intended to replace advice given to you by your health care provider. Make sure you discuss any questions you have with your health care provider. Document Released: 07/06/2009 Document Revised: 02/14/2016 Document Reviewed: 08/10/2014  2017 Elsevier   Abdominal Pain, Adult Many things can cause belly (abdominal) pain. Most times, belly pain is not dangerous. Many cases of belly pain can be watched and treated at home. Sometimes belly pain is serious, though. Your doctor will try to find the cause of your belly pain. Follow these instructions at home:  Take over-the-counter and prescription medicines only as told by your doctor. Do not take medicines that help you poop (laxatives) unless told to by your doctor.  Drink enough fluid to keep your pee (urine) clear or pale yellow.  Watch your belly pain for any changes.  Keep all  follow-up visits as told by your doctor. This is important. Contact a doctor if:  Your belly pain changes or gets worse.  You are not hungry, or you lose weight without trying.  You are having trouble pooping (constipated) or have watery poop (diarrhea) for more than 2-3 days.  You have pain when you pee or poop.  Your belly pain wakes you up at night.  Your pain gets worse with meals, after eating, or with certain foods.  You are throwing up and cannot keep anything down.  You have a fever. Get help right away if:  Your pain does not go away as soon as your doctor says it should.  You cannot stop throwing up.  Your pain is only in areas of your belly, such as the right side or the left lower part of the belly.  You have bloody or black poop, or poop that looks like tar.  You have very bad pain, cramping, or bloating in your belly.  You have signs of not having enough fluid or water in your body (dehydration), such as:  Dark pee, very little pee, or no pee.  Cracked lips.  Dry mouth.  Sunken eyes.  Sleepiness.  Weakness. This information is not intended to replace advice given to you by your health care provider. Make sure you discuss any questions you have with your health care provider. Document Released: 02/25/2008 Document Revised: 03/28/2016 Document Reviewed: 02/20/2016 Elsevier Interactive Patient Education  2017 Elsevier Inc. Heart healthy and ADA diet. Continue home O2 [place] 2L.

## 2016-10-08 NOTE — Discharge Summary (Signed)
[facility] [place] [facility]   PATIENT NAME: Howard [name]    MR#:  [id number]  DATE OF BIRTH:  Jan 01, 1981  DATE OF ADMISSION:  10/04/2016   ADMITTING PHYSICIAN: Shaune Pollack, MD  DATE OF DISCHARGE: 10/08/2016 12:30 PM  PRIMARY CARE PHYSICIAN: Marisue Ivan, MD   ADMISSION DIAGNOSIS:  Hypoxia [R09.02] Ascites due to alcoholic cirrhosis (HCC) [K70.31] DISCHARGE DIAGNOSIS:  Active Problems:   Ascites   GIB (gastrointestinal bleeding) Acute on chronic respiratory failure with hypoxia and hypercapnia-secondary to sleep apnea, obesity pickwickian syndrome, COPD hepatic encephalopathy SECONDARY DIAGNOSIS:   Past Medical History:  Diagnosis Date  . Alcoholic cirrhosis of liver with ascites (HCC)   . Anemia   . COPD (chronic obstructive pulmonary disease) (HCC)   . Depression   . Diabetes (HCC)   . Diabetes mellitus, type II (HCC)   . Esophageal varices (HCC)   . Heart disease   . Hyperlipemia   . Hypertension   . Liver disease   . Multiple thyroid nodules   . Portal hypertensive gastropathy   . Schizophrenia [facility])    HOSPITAL COURSE:  36 year old male with past medical history of alcohol abuse, COPD, obstructive sleep apnea who presented to the hospital due to shortness of breath.  1. Acute on chronic respiratory failure with hypoxia and hypercapnia-secondary to sleep apnea, obesity pickwickian syndrome, COPD -Continue BiPAP at bedtime.   -Abdominal ultrasound showing trace amount of ascites and no need for Paracentesis -  Cont. Albuterol nebs PRN  2. COPD - no acute exacerbation.  - cont. Albuterol nebs as needed.   3 hepatic encephalopathy-ammonia level elevated to 94 yesterday but down to 47 his baseline today.  - Continue lactulose, Xifaxan and mental status improved.   4. Suspected GI bleed-patient's stools are actually heme negative. -Hemoglobin did drop to 6.8 yesterday and transfused 1 units and post transfusion pt's Hg. Has  improved.   Pt. Has hx of esophogeal varices.  Cont. Nadolol - seen by GI and no plans for acute intervention. Cont. PPI BID.   5. DM - cont. SSI. BS stable.   6. ETOh abuse - cont. CIWA.  - cont. Folic acid, Thiamine  7. Depression - cont. Celexa.    8. Hx of Schizophrenia - cont. Zyprexa.   DISCHARGE CONDITIONS:  Stable, discharge to home today. CONSULTS OBTAINED:  Treatment Team:  Wyline Mood, MD Midge Minium, MD Shane Crutch, MD DRUG ALLERGIES:  No Known Allergies DISCHARGE MEDICATIONS:   Allergies as of 10/08/2016   No Known Allergies     Medication List    STOP taking these medications   omeprazole 40 MG capsule Commonly known as:  PRILOSEC     TAKE these medications   acamprosate 333 MG tablet Commonly known as:  CAMPRAL Take 2 tablets (666 mg total) by mouth 3 (three) times daily with meals.   albuterol 108 (90 Base) MCG/ACT inhaler Commonly known as:  PROVENTIL HFA;VENTOLIN HFA Inhale 2 puffs into the lungs every 6 (six) hours as needed for wheezing or shortness of breath.   citalopram 20 MG tablet Commonly known as:  CELEXA Take 1 tablet (20 mg total) by mouth every morning.   furosemide 20 MG tablet Commonly known as:  LASIX Take 1 tablet (20 mg total) by mouth daily.   lactulose 10 GM/15ML solution Commonly known as:  CHRONULAC Take 45 mLs (30 g total) by mouth every 4 (four) hours.   levETIRAcetam 1000 MG tablet Commonly known as:  KEPPRA  Take 1 tablet (1,000 mg total) by mouth 2 (two) times daily.   losartan 25 MG tablet Commonly known as:  COZAAR Take 25 mg by mouth daily.   metFORMIN 500 MG tablet Commonly known as:  GLUCOPHAGE Take 1 tablet (500 mg total) by mouth 2 (two) times daily with a meal.   modafinil 100 MG tablet Commonly known as:  PROVIGIL Take 1 tablet (100 mg total) by mouth daily.   multivitamin with minerals Tabs tablet Take 1 tablet by mouth daily.   nadolol 20 MG tablet Commonly known as:   CORGARD Take 20 mg by mouth daily.   OLANZapine 15 MG tablet Commonly known as:  ZYPREXA Take 2 tablets (30 mg total) by mouth at bedtime.   ondansetron 4 MG tablet Commonly known as:  ZOFRAN Take 1 tablet (4 mg total) by mouth every 8 (eight) hours as needed for nausea or vomiting.   pantoprazole 40 MG tablet Commonly known as:  PROTONIX Take 1 tablet (40 mg total) by mouth 2 (two) times daily before a meal.   rifaximin 550 MG Tabs tablet Commonly known as:  XIFAXAN Take 1 tablet (550 mg total) by mouth 2 (two) times daily.   spironolactone 50 MG tablet Commonly known as:  ALDACTONE Take 1 tablet (50 mg total) by mouth 2 (two) times daily.   sucralfate 1 g tablet Commonly known as:  CARAFATE Take 1 g by mouth 4 (four) times daily.   traZODone 100 MG tablet Commonly known as:  DESYREL Take 1 tablet (100 mg total) by mouth at bedtime as needed for sleep.        DISCHARGE INSTRUCTIONS:  See AVS.  If you experience worsening of your admission symptoms, develop shortness of breath, life threatening emergency, suicidal or homicidal thoughts you must seek medical attention immediately by calling 911 or calling your MD immediately  if symptoms less severe.  You Must read complete instructions/literature along with all the possible adverse reactions/side effects for all the Medicines you take and that have been prescribed to you. Take any new Medicines after you have completely understood and accpet all the possible adverse reactions/side effects.   Please note  You were cared for by a hospitalist during your hospital stay. If you have any questions about your discharge medications or the care you received while you were in the hospital after you are discharged, you can call the unit and asked to speak with the hospitalist on call if the hospitalist that took care of you is not available. Once you are discharged, your primary care physician will handle any further medical issues.  Please note that NO REFILLS for any discharge medications will be authorized once you are discharged, as it is imperative that you return to your primary care physician (or establish a relationship with a primary care physician if you do not have one) for your aftercare needs so that they can reassess your need for medications and monitor your lab values.    On the day of Discharge:  VITAL SIGNS:  Blood pressure (!) 103/46, pulse 83, temperature 97.7 F (36.5 C), temperature source Oral, resp. rate 18, height 6\' 3"  (1.905 m), weight (!) 381 lb 11.2 oz (173.1 kg), SpO2 91 %. PHYSICAL EXAMINATION:  GENERAL:  36 y.o.-year-old patient lying in the bed with no acute distress. Morbidly obese. EYES: Pupils equal, round, reactive to light and accommodation. No scleral icterus. Extraocular muscles intact.  HEENT: Head atraumatic, normocephalic. Oropharynx and nasopharynx clear.  NECK:  Supple, no jugular venous distention. No thyroid enlargement, no tenderness.  LUNGS: Normal breath sounds bilaterally, no wheezing, rales,rhonchi or crepitation. No use of accessory muscles of respiration.  CARDIOVASCULAR: S1, S2 normal. No murmurs, rubs, or gallops.  ABDOMEN: Soft, non-tender, non-distended. Bowel sounds present. No organomegaly or mass.  EXTREMITIES: No cyanosis, or clubbing. +1-2 edema b/l legs. NEUROLOGIC: Cranial nerves II through XII are intact. Muscle strength 5/5 in all extremities. Sensation intact. Gait not checked.  PSYCHIATRIC: The patient is alert and oriented x 3.  SKIN: No obvious rash, lesion, or ulcer.  DATA REVIEW:   CBC  Recent Labs Lab 10/08/16 0511  WBC 5.2  HGB 7.4*  HCT 24.6*  PLT 118*    Chemistries   Recent Labs Lab 10/05/16 0514 10/06/16 0457  NA 143 145  K 4.3 4.4  CL 101 98*  CO2 38* 40*  GLUCOSE 132* 101*  BUN 11 13  CREATININE 0.75 0.93  CALCIUM 8.7* 9.0  AST 37  --   ALT 24  --   ALKPHOS 114  --   BILITOT 0.6  --      Microbiology Results   Results for orders placed or performed during the hospital encounter of 10/04/16  MRSA PCR Screening     Status: None   Collection Time: 10/05/16 11:00 PM  Result Value Ref Range Status   MRSA by PCR NEGATIVE NEGATIVE Final    Comment:        The GeneXpert MRSA Assay (FDA approved for NASAL specimens only), is one component of a comprehensive MRSA colonization surveillance program. It is not intended to diagnose MRSA infection nor to guide or monitor treatment for MRSA infections.     RADIOLOGY:  No results found.   Management plans discussed with the patient, family and they are in agreement.  CODE STATUS:  Code Status History    Date Active Date Inactive Code Status Order ID Comments User Context   10/04/2016  6:04 PM 10/08/2016  3:44 PM Full Code [id number]  Shaune PollackQing Shadrach Bartunek, MD Inpatient   09/22/2016  2:29 AM 09/23/2016  6:38 PM Full Code [id number]  Oralia Manisavid Willis, MD Inpatient   08/07/2016  7:37 PM 08/12/2016  8:51 PM Full Code [id number]  Gracelyn NurseJohn D Johnston, MD Inpatient   07/07/2016  1:40 AM 07/09/2016  6:29 PM Full Code [id number]  Ihor AustinPavan Pyreddy, MD Inpatient   06/10/2016  6:21 AM 06/11/2016  8:25 PM Full Code [id number]  Ihor AustinPavan Pyreddy, MD Inpatient   05/12/2016  3:55 AM 05/15/2016  2:57 PM Full Code [id number]  Ihor AustinPavan Pyreddy, MD Inpatient   04/28/2016  2:54 AM 04/29/2016  6:23 PM Full Code [id number]  Tonye RoyaltyAlexis Hugelmeyer, DO Inpatient   03/14/2016 12:30 PM 03/16/2016  3:41 PM Full Code [id number]  Shaune PollackQing Altheria Shadoan, MD Inpatient   03/03/2016 12:13 PM 03/06/2016  2:26 PM Full Code [id number]  Milagros LollSrikar Sudini, MD ED   02/10/2016  5:54 PM 02/12/2016  7:34 PM Full Code [id number]  Ramonita LabAruna Gouru, MD Inpatient   11/11/2015  5:16 AM 11/13/2015  6:00 PM Full Code [id number]  Ihor AustinPavan Pyreddy, MD Inpatient   11/07/2015 10:02 PM 11/09/2015  4:56 PM Full Code [id number]  Enid Baasadhika Kalisetti, MD Inpatient   10/28/2015  8:14 PM 10/29/2015  2:26 PM Full Code [id number]  Altamese DillingVaibhavkumar Vachhani, MD Inpatient   10/16/2015 11:47 PM 10/17/2015  6:00 PM Full  Code [id number]  Oralia Manisavid Willis, MD Inpatient   07/30/2015  5:35 PM 08/04/2015  8:34 PM Full Code [id number]  Enedina FinnerSona Patel, MD  Inpatient   07/05/2015  1:16 AM 07/08/2015  7:22 PM Full Code [id number]  Oralia Manis, MD Inpatient   05/30/2015  7:28 PM 06/01/2015  3:36 PM Full Code [id number]  Audery Amel, MD Inpatient      TOTAL TIME TAKING CARE OF THIS PATIENT: 33 minutes.    Shaune Pollack M.D on 10/08/2016 at 7:24 PM  Between 7am to 6pm - Pager - [phone]  After 6pm go to [email or web address] - Social research officer, government  [facility] [place] [facility]  Office  [phone]  CC: Primary care physician; Marisue Ivan, MD   Note: This dictation was prepared with Dragon dictation along with smaller phrase technology. Any transcriptional errors that result from this process are unintentional.

## 2016-10-08 NOTE — Evaluation (Signed)
Physical Therapy Evaluation Patient Details Name: Howard LinemanJoey A [name] MRN: [id number] DOB: 06/30/1981 Today's Date: 10/08/2016   History of Present Illness  Patient is a 36 y/o male that presents with shortness of breath, went to the ICU initially for BiPap, has a history of alcoholic cirrhosis.   Clinical Impression  Patient seen after having shortness of breath leading to admission. He maintained O2 sats above 91% on 2L during ambulation, and demonstrates increased reliance on QC from previous admission. He is able to perform transfers at baseline level with no loss of balance noted. Patient had been receiving HHPT, and would benefit from continued work with HHPT after discharge given his decreased cardiopulmonary status and reliance on QC.     Follow Up Recommendations Home health PT    Equipment Recommendations       Recommendations for Other Services       Precautions / Restrictions Precautions Precautions: Fall Restrictions Weight Bearing Restrictions: No      Mobility  Bed Mobility Overal bed mobility: Independent             General bed mobility comments: No deficits identified with bed mobility.   Transfers Overall transfer level: Needs assistance Equipment used: Quad cane Transfers: Sit to/from Stand Sit to Stand: Supervision         General transfer comment: No deficits in balance or timing to complete transfer.   Ambulation/Gait Ambulation/Gait assistance: Supervision Ambulation Distance (Feet): 250 Feet Assistive device: Quad cane Gait Pattern/deviations: WFL(Within Functional Limits)   Gait velocity interpretation: <1.8 ft/sec, indicative of risk for recurrent falls General Gait Details: Patient demonstrates poor patterning with quad cane, though no loss of balance.   Stairs            Wheelchair Mobility    Modified Rankin (Stroke Patients Only)       Balance Overall balance assessment: Needs assistance Sitting-balance support: No  upper extremity supported;Feet supported Sitting balance-Leahy Scale: Normal     Standing balance support: Single extremity supported Standing balance-Leahy Scale: Fair                               Pertinent Vitals/Pain Pain Assessment: No/denies pain    Home Living Family/patient expects to be discharged to:: Private residence Living Arrangements: Parent;Other relatives;Other (Comment) Available Help at Discharge: Family;Available 24 hours/day Type of Home: House Home Access: Stairs to enter Entrance Stairs-Rails: Right;Left;Can reach both Entrance Stairs-Number of Steps: 6 Home Layout: One level Home Equipment: Walker - 2 wheels;Cane - quad;Wheelchair - power      Prior Function Level of Independence: Needs assistance   Gait / Transfers Assistance Needed: Pt uses QC most of the time in house and power wheelchair when out in the community  ADL's / Homemaking Assistance Needed: Independent with ADLs, assist from family with IADLs  Comments: Pt uses QC most of the time in the house, uses w/c outside of home - apparently OOH almost daily?  -- per old note     Hand Dominance   Dominant Hand: Right    Extremity/Trunk Assessment   Upper Extremity Assessment Upper Extremity Assessment: Overall WFL for tasks assessed    Lower Extremity Assessment Lower Extremity Assessment: Overall WFL for tasks assessed       Communication   Communication: No difficulties  Cognition Arousal/Alertness: Awake/alert Behavior During Therapy: WFL for tasks assessed/performed Overall Cognitive Status: History of cognitive impairments - at baseline  General Comments      Exercises     Assessment/Plan    PT Assessment Patient needs continued PT services  PT Problem List Decreased strength;Decreased balance          PT Treatment Interventions DME instruction;Gait training;Stair training;Therapeutic exercise;Therapeutic activities;Balance  training;Functional mobility training;Neuromuscular re-education    PT Goals (Current goals can be found in the Care Plan section)  Acute Rehab PT Goals Patient Stated Goal: to return home  PT Goal Formulation: With patient Time For Goal Achievement: 10/22/16 Potential to Achieve Goals: Good    Frequency Min 2X/week   Barriers to discharge        Co-evaluation               End of Session Equipment Utilized During Treatment: Gait belt Activity Tolerance: Patient tolerated treatment well Patient left: in chair;with chair alarm set;with call bell/phone within reach Nurse Communication: Mobility status    Functional Assessment Tool Used: Clinical judgement  Functional Limitation: Mobility: Walking and moving around Mobility: Walking and Moving Around Current Status [phone]): At least 1 percent but less than 20 percent impaired, limited or restricted Mobility: Walking and Moving Around Goal Status [phone]): At least 1 percent but less than 20 percent impaired, limited or restricted    Time: 0981-1914 PT Time Calculation (min) (ACUTE ONLY): 15 min   Charges:   PT Evaluation $PT Eval Low Complexity: 1 Procedure     PT G Codes:   PT G-Codes **NOT FOR INPATIENT CLASS** Functional Assessment Tool Used: Clinical judgement  Functional Limitation: Mobility: Walking and moving around Mobility: Walking and Moving Around Current Status (N8295): At least 1 percent but less than 20 percent impaired, limited or restricted Mobility: Walking and Moving Around Goal Status [phone]): At least 1 percent but less than 20 percent impaired, limited or restricted   Kerin Ransom, PT, DPT    10/08/2016, 1:22 PM

## 2016-10-13 ENCOUNTER — Emergency Department
Admission: EM | Admit: 2016-10-13 | Discharge: 2016-10-13 | Disposition: A | Discharge status: Home / Self Care | Payer: Medicare Other | Attending: Emergency Medicine | Admitting: Emergency Medicine

## 2016-10-13 DIAGNOSIS — Z7984 Long term (current) use of oral hypoglycemic drugs: Secondary | ICD-10-CM | POA: Insufficient documentation

## 2016-10-13 DIAGNOSIS — Z5321 Procedure and treatment not carried out due to patient leaving prior to being seen by health care provider: Secondary | ICD-10-CM | POA: Insufficient documentation

## 2016-10-13 DIAGNOSIS — Z79899 Other long term (current) drug therapy: Secondary | ICD-10-CM | POA: Insufficient documentation

## 2016-10-13 DIAGNOSIS — E119 Type 2 diabetes mellitus without complications: Secondary | ICD-10-CM | POA: Diagnosis not present

## 2016-10-13 DIAGNOSIS — I10 Essential (primary) hypertension: Secondary | ICD-10-CM | POA: Insufficient documentation

## 2016-10-13 DIAGNOSIS — J449 Chronic obstructive pulmonary disease, unspecified: Secondary | ICD-10-CM | POA: Diagnosis not present

## 2016-10-13 DIAGNOSIS — K920 Hematemesis: Secondary | ICD-10-CM | POA: Diagnosis present

## 2016-10-13 LAB — CBC
HCT: 27.8 % — ABNORMAL LOW (ref 40.0–52.0)
HEMOGLOBIN: 8.5 g/dL — AB (ref 13.0–18.0)
MCH: 23 pg — AB (ref 26.0–34.0)
MCHC: 30.6 g/dL — AB (ref 32.0–36.0)
MCV: 75.2 fL — ABNORMAL LOW (ref 80.0–100.0)
Platelets: 134 10*3/uL — ABNORMAL LOW (ref 150–440)
RBC: 3.7 MIL/uL — ABNORMAL LOW (ref 4.40–5.90)
RDW: 18.2 % — AB (ref 11.5–14.5)
WBC: 7.8 10*3/uL (ref 3.8–10.6)

## 2016-10-13 LAB — COMPREHENSIVE METABOLIC PANEL
ALBUMIN: 3.6 g/dL (ref 3.5–5.0)
ALK PHOS: 112 U/L (ref 38–126)
ALT: 30 U/L (ref 17–63)
ANION GAP: 5 (ref 5–15)
AST: 47 U/L — AB (ref 15–41)
BUN: 11 mg/dL (ref 6–20)
CALCIUM: 8.8 mg/dL — AB (ref 8.9–10.3)
CO2: 33 mmol/L — AB (ref 22–32)
Chloride: 101 mmol/L (ref 101–111)
Creatinine, Ser: 0.86 mg/dL (ref 0.61–1.24)
GFR calc Af Amer: >60 mL/min (ref >60)
GFR calc non Af Amer: >60 mL/min (ref >60)
GLUCOSE: 91 mg/dL (ref 65–99)
Potassium: 5.3 mmol/L — ABNORMAL HIGH (ref 3.5–5.1)
SODIUM: 139 mmol/L (ref 135–145)
Total Bilirubin: 0.5 mg/dL (ref 0.3–1.2)
Total Protein: 8.3 g/dL — ABNORMAL HIGH (ref 6.5–8.1)

## 2016-10-13 LAB — TYPE AND SCREEN
ABO/RH(D): O POS
Antibody Screen: NEGATIVE

## 2016-10-13 NOTE — ED Notes (Signed)
No answer when called several times from lobby 

## 2016-10-13 NOTE — ED Triage Notes (Signed)
Pt was seen in the er one week ago and needed a blood transfusion then admitted to the hospital - this morning pt started with blood in emesis again - pt c/o feeling "shaky" - he states that his "liver is leaking blood" - pt states this is how he felt last week when he needed transfusion

## 2016-10-13 NOTE — ED Notes (Signed)
Called twice to repeat VS. Pt did not respond to name called.

## 2016-10-15 ENCOUNTER — Encounter: Payer: Self-pay | Admitting: Emergency Medicine

## 2016-10-15 ENCOUNTER — Emergency Department
Admission: EM | Admit: 2016-10-15 | Discharge: 2016-10-16 | Payer: Medicare Other | Attending: Emergency Medicine | Admitting: Emergency Medicine

## 2016-10-15 ENCOUNTER — Emergency Department: Payer: Medicare Other

## 2016-10-15 ENCOUNTER — Ambulatory Visit (HOSPITAL_COMMUNITY)
Admission: AD | Admit: 2016-10-15 | Discharge: 2016-10-15 | Disposition: A | Discharge status: Home / Self Care | Payer: Medicare Other | Source: Other Acute Inpatient Hospital | Attending: Emergency Medicine | Admitting: Emergency Medicine

## 2016-10-15 DIAGNOSIS — K920 Hematemesis: Secondary | ICD-10-CM | POA: Diagnosis present

## 2016-10-15 DIAGNOSIS — R58 Hemorrhage, not elsewhere classified: Secondary | ICD-10-CM | POA: Insufficient documentation

## 2016-10-15 DIAGNOSIS — K921 Melena: Secondary | ICD-10-CM | POA: Diagnosis not present

## 2016-10-15 DIAGNOSIS — J449 Chronic obstructive pulmonary disease, unspecified: Secondary | ICD-10-CM | POA: Diagnosis not present

## 2016-10-15 DIAGNOSIS — I8290 Acute embolism and thrombosis of unspecified vein: Secondary | ICD-10-CM | POA: Diagnosis not present

## 2016-10-15 DIAGNOSIS — Z7984 Long term (current) use of oral hypoglycemic drugs: Secondary | ICD-10-CM | POA: Diagnosis not present

## 2016-10-15 DIAGNOSIS — E119 Type 2 diabetes mellitus without complications: Secondary | ICD-10-CM | POA: Diagnosis not present

## 2016-10-15 DIAGNOSIS — Z79899 Other long term (current) drug therapy: Secondary | ICD-10-CM | POA: Diagnosis not present

## 2016-10-15 DIAGNOSIS — I10 Essential (primary) hypertension: Secondary | ICD-10-CM | POA: Insufficient documentation

## 2016-10-15 DIAGNOSIS — R195 Other fecal abnormalities: Secondary | ICD-10-CM

## 2016-10-15 DIAGNOSIS — R1012 Left upper quadrant pain: Secondary | ICD-10-CM | POA: Diagnosis not present

## 2016-10-15 LAB — CBC
HCT: 26.6 % — ABNORMAL LOW (ref 40.0–52.0)
HEMATOCRIT: 26.9 % — AB (ref 40.0–52.0)
Hemoglobin: 8.3 g/dL — ABNORMAL LOW (ref 13.0–18.0)
Hemoglobin: 8 g/dL — ABNORMAL LOW (ref 13.0–18.0)
MCH: 23.2 pg — ABNORMAL LOW (ref 26.0–34.0)
MCH: 22.7 pg — AB (ref 26.0–34.0)
MCHC: 30.8 g/dL — ABNORMAL LOW (ref 32.0–36.0)
MCHC: 30.2 g/dL — ABNORMAL LOW (ref 32.0–36.0)
MCV: 75.5 fL — ABNORMAL LOW (ref 80.0–100.0)
MCV: 75.2 fL — ABNORMAL LOW (ref 80.0–100.0)
PLATELETS: 136 10*3/uL — AB (ref 150–440)
Platelets: 132 10*3/uL — ABNORMAL LOW (ref 150–440)
RBC: 3.56 MIL/uL — AB (ref 4.40–5.90)
RBC: 3.53 MIL/uL — AB (ref 4.40–5.90)
RDW: 18.3 % — AB (ref 11.5–14.5)
RDW: 17.8 % — ABNORMAL HIGH (ref 11.5–14.5)
WBC: 7.5 10*3/uL (ref 3.8–10.6)
WBC: 8.1 10*3/uL (ref 3.8–10.6)

## 2016-10-15 LAB — COMPREHENSIVE METABOLIC PANEL
ALT: 30 U/L (ref 17–63)
AST: 47 U/L — ABNORMAL HIGH (ref 15–41)
Albumin: 3.4 g/dL — ABNORMAL LOW (ref 3.5–5.0)
Alkaline Phosphatase: 118 U/L (ref 38–126)
Anion gap: 5 (ref 5–15)
BILIRUBIN TOTAL: 0.5 mg/dL (ref 0.3–1.2)
BUN: 17 mg/dL (ref 6–20)
CO2: 31 mmol/L (ref 22–32)
CREATININE: 1 mg/dL (ref 0.61–1.24)
Calcium: 8.6 mg/dL — ABNORMAL LOW (ref 8.9–10.3)
Chloride: 103 mmol/L (ref 101–111)
Glucose, Bld: 117 mg/dL — ABNORMAL HIGH (ref 65–99)
POTASSIUM: 5 mmol/L (ref 3.5–5.1)
Sodium: 139 mmol/L (ref 135–145)
TOTAL PROTEIN: 8 g/dL (ref 6.5–8.1)

## 2016-10-15 LAB — URINALYSIS, COMPLETE (UACMP) WITH MICROSCOPIC
BILIRUBIN URINE: NEGATIVE
Bacteria, UA: NONE SEEN
GLUCOSE, UA: NEGATIVE mg/dL
Ketones, ur: NEGATIVE mg/dL
LEUKOCYTES UA: NEGATIVE
NITRITE: NEGATIVE
PH: 6 (ref 5.0–8.0)
Protein, ur: NEGATIVE mg/dL
SPECIFIC GRAVITY, URINE: 1.01 (ref 1.005–1.030)

## 2016-10-15 LAB — APTT: aPTT: 29 seconds (ref 24–36)

## 2016-10-15 LAB — TYPE AND SCREEN
ABO/RH(D): O POS
ANTIBODY SCREEN: NEGATIVE

## 2016-10-15 LAB — LIPASE, BLOOD: LIPASE: 42 U/L (ref 11–51)

## 2016-10-15 LAB — PROTIME-INR
INR: 1.11
Prothrombin Time: 14.4 seconds (ref 11.4–15.2)

## 2016-10-15 MED ORDER — ACETAMINOPHEN 650 MG RE SUPP: 975.0000 mg | Freq: Once | RECTAL | Status: DC

## 2016-10-15 MED ORDER — SODIUM CHLORIDE 0.9 % IV SOLN
80.0000 mg | Freq: Once | INTRAVENOUS | Status: AC
  Administered 2016-10-15: 20:00:00 80 mg via INTRAVENOUS
  Filled 2016-10-15: qty 80

## 2016-10-15 MED ORDER — ONDANSETRON HCL 4 MG/2ML IJ SOLN
4.0000 mg | Freq: Once | INTRAMUSCULAR | Status: AC
  Administered 2016-10-15: 4 mg via INTRAVENOUS
  Filled 2016-10-15: qty 2

## 2016-10-15 MED ORDER — SODIUM CHLORIDE 0.9 % IV SOLN
8.0000 mg/h | INTRAVENOUS | Status: DC
  Administered 2016-10-15: 8 mg/h via INTRAVENOUS
  Filled 2016-10-15: qty 80

## 2016-10-15 MED ORDER — IOPAMIDOL (ISOVUE-300) INJECTION 61%
125.0000 mL | Freq: Once | INTRAVENOUS | Status: AC | PRN
  Administered 2016-10-15: 125 mL via INTRAVENOUS

## 2016-10-15 MED ORDER — PANTOPRAZOLE SODIUM 40 MG IV SOLR: 40.0000 mg | Freq: Two times a day (BID) | INTRAVENOUS | Status: DC

## 2016-10-15 MED ORDER — IOPAMIDOL (ISOVUE-300) INJECTION 61%
30.0000 mL | Freq: Once | INTRAVENOUS | Status: AC
  Administered 2016-10-15: 30 mL via ORAL

## 2016-10-15 MED ORDER — NITROGLYCERIN 0.4 MG SL SUBL: 0.4000 mg | SUBLINGUAL_TABLET | SUBLINGUAL | Status: DC | PRN

## 2016-10-15 NOTE — ED Provider Notes (Addendum)
[facility] Emergency Department Provider Note  ____________________________________________  Time seen: Approximately 6:29 PM  I have reviewed the triage vital signs and the nursing notes.   HISTORY  Chief Complaint Hematemesis    HPI Howard [name] is a 36 y.o. male area of alcoholic cirrhosis and ongoing alcohol abuse, esophageal varices, COPD, HTN presenting with hematemesis. The patient reports that he had several episodes of vomiting 3 days ago which resolved. Today he had 2 additional episodes that were blood-streaked. He has had associated left upper quadrant pain.  He has chronic diarrhea from taking lactulose, and this is unchanged. He has not noticed any black or bloody stools. No fevers or chills. No lightheadedness, chest pain, shortness of breath or syncope.   Past Medical History:  Diagnosis Date  . Alcoholic cirrhosis of liver with ascites (HCC)   . Anemia   . COPD (chronic obstructive pulmonary disease) (HCC)   . Depression   . Diabetes (HCC)   . Diabetes mellitus, type II (HCC)   . Esophageal varices (HCC)   . Heart disease   . Hyperlipemia   . Hypertension   . Liver disease   . Multiple thyroid nodules   . Portal hypertensive gastropathy   . Schizophrenia [facility](HCC)     Patient Active Problem List   Diagnosis Date Noted  . GIB (gastrointestinal bleeding) 10/05/2016  . Ascites 10/04/2016  . DNR (do not resuscitate) discussion 08/11/2016  . Palliative care by specialist 08/11/2016  . Muscle weakness (generalized)   . Acute upper GI bleed   . GI bleed 08/07/2016  . Alcoholic cirrhosis of liver (HCC) 07/09/2016  . Acute respiratory failure with hypercapnia (HCC) 07/09/2016  . Hyperglycemia 07/09/2016  . Seizure (HCC) 06/10/2016  . Altered mental status 05/12/2016  . Headache 04/29/2016  . OSA (obstructive sleep apnea) 04/29/2016  . Anemia 04/29/2016  . Thrombocytopenia (HCC) 04/29/2016  . Coagulopathy (HCC) 04/29/2016  . Obesity  04/29/2016  . Generalized weakness 04/29/2016  . Acute hepatic encephalopathy 03/14/2016  . Controlled type 2 diabetes mellitus without complication (HCC) 01/15/2016  . Normocytic anemia 12/12/2015  . Essential (primary) hypertension 12/11/2015  . Pure hypercholesterolemia 12/11/2015  . Fever 11/11/2015  . Ascites due to alcoholic cirrhosis (HCC) 11/11/2015  . Cirrhosis of liver with ascites (HCC) 11/11/2015  . Hepatic encephalopathy (HCC) 10/16/2015  . Type 2 diabetes mellitus (HCC) 10/16/2015  . Acute renal failure (ARF) (HCC) 07/30/2015  . 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 Alcoholic cirrhosis (HCC) 07/19/2015  . Hypoxia 07/04/2015  . Elevated transaminase level 07/04/2015  . DOE (dyspnea on exertion) 07/04/2015  . Alcohol abuse 05/31/2015  . Hypertension 05/29/2015  . Paranoid schizophrenia (HCC) 03/20/2015    Past Surgical History:  Procedure Laterality Date  . ESOPHAGOGASTRODUODENOSCOPY N/A 10/05/2015   Procedure: ESOPHAGOGASTRODUODENOSCOPY (EGD);  Surgeon: Elnita MaxwellMatthew Gordon Rein, MD;  [facility];  Service: Endoscopy;  Laterality: N/A;  . ESOPHAGOGASTRODUODENOSCOPY (EGD) WITH PROPOFOL N/A 08/03/2015   Procedure: ESOPHAGOGASTRODUODENOSCOPY (EGD) WITH PROPOFOL;  Surgeon: Elnita MaxwellMatthew Gordon Rein, MD;  [facility];  Service: Endoscopy;  Laterality: N/A;  . ESOPHAGOGASTRODUODENOSCOPY (EGD) WITH PROPOFOL N/A 08/31/2015   Procedure: ESOPHAGOGASTRODUODENOSCOPY (EGD) WITH PROPOFOL;  Surgeon: Elnita MaxwellMatthew Gordon Rein, MD;  [facility];  Service: Endoscopy;  Laterality: N/A;  . ESOPHAGOGASTRODUODENOSCOPY (EGD) WITH PROPOFOL N/A 04/04/2016   Procedure: ESOPHAGOGASTRODUODENOSCOPY (EGD) WITH PROPOFOL;  Surgeon: Scot Junobert T Elliott, MD;  [facility];  Service: Endoscopy;  Laterality: N/A;  . NO PAST SURGERIES  Current Outpatient Rx  . Order #: [id number] Class: Normal  . Order #: [id number] Class: Print  . Order #:  [id number] Class: Normal  . Order #: [id number] Class: Print  . Order #: [id number] Class: Normal  . Order #: [id number] Class: Normal  . Order #: [id number] Class: Historical Med  . Order #: [id number] Class: Print  . Order #: [id number] Class: Print  . Order #: [id number] Class: Normal  . Order #: [id number] Class: Historical Med  . Order #: [id number] Class: Normal  . Order #: [id number] Class: Print  . Order #: [id number] Class: Print  . Order #: [id number] Class: Normal  . Order #: [id number] Class: Normal  . Order #: [id number] Class: Historical Med  . Order #: [id number] Class: Normal    Allergies Patient has no known allergies.  Family History  Problem Relation Age of Onset  . Heart disease Mother   . Hypertension Mother   . Hyperlipidemia Mother   . Stroke Father   . Heart attack Father   . Hypertension Father   . Heart disease Father   . Alcohol abuse Father   . Heart disease Brother     Social History Social History  Substance Use Topics  . Smoking status: Never Smoker  . Smokeless tobacco: Never Used  . Alcohol use 30.0 oz/week    50 Cans of beer per week     Comment: last drink monday 10/13/16    Review of Systems Constitutional: No fever/chills.No lightheadedness or syncope. Eyes: No visual changes. ENT: No sore throat. No congestion or rhinorrhea. Cardiovascular: Denies chest pain. Denies palpitations. Respiratory: Denies shortness of breath.  No cough. Gastrointestinal: Positive left upper quadrant abdominal pain.  Positive nausea, positive bloody vomiting.  Positive chronic unchanged diarrhea.  No constipation. Genitourinary: Negative for dysuria. Musculoskeletal: Negative for back pain. Skin: Negative for rash. Neurological: Negative for headaches. No focal numbness, tingling or weakness.   10-point ROS otherwise negative.  ____________________________________________   PHYSICAL EXAM:  VITAL SIGNS: ED Triage Vitals  Enc Vitals Group     BP 10/15/16 1623 112/63      Pulse Rate 10/15/16 1623 77     Resp 10/15/16 1623 18     Temp 10/15/16 1623 97.9 F (36.6 C)     Temp Source 10/15/16 1623 Oral     SpO2 10/15/16 1623 94 %     Weight 10/15/16 1620 (!) 360 lb (163.3 kg)     Height 10/15/16 1620 6\' 3"  (1.905 m)     Head Circumference --      Peak Flow --      Pain Score --      Pain Loc --      Pain Edu? --      Excl. in GC? --     Constitutional: Alert and oriented. Chronically ill appearing but in no acute distress. Answers questions appropriately. Eyes: Conjunctivae are normal.  EOMI. No scleral icterus. Head: Atraumatic. Nose: No congestion/rhinnorhea. Mouth/Throat: Mucous membranes are moist.  Neck: No stridor.  Supple.  No JVD. No meningismus. Cardiovascular: Normal rate, regular rhythm. No murmurs, rubs or gallops.  Respiratory: Normal respiratory effort.  No accessory muscle use or retractions. Lungs CTAB.  No wheezes, rales or ronchi. Gastrointestinal: Morbidly obese. Soft, and nondistended.  Tender to palpation in the left upper quadrant. No guarding or rebound.  No peritoneal signs. Genitourinary: No evidence of external or palpable internal hemorrhoids. Stool is light brown and guaiac positive. Musculoskeletal: No LE edema. Neurologic:  A&Ox3.  Speech is clear.  Face and smile are symmetric.  EOMI.  Moves all  extremities well. Skin:  Skin is warm, dry and intact. No rash noted. Psychiatric: Mood and affect are normal. Speech and behavior are normal.  Normal judgement.  ____________________________________________   LABS (all labs ordered are listed, but only abnormal results are displayed)  Labs Reviewed  COMPREHENSIVE METABOLIC PANEL - Abnormal; Notable for the following:       Result Value   Glucose, Bld 117 (*)    Calcium 8.6 (*)    Albumin 3.4 (*)    AST 47 (*)    All other components within normal limits  CBC - Abnormal; Notable for the following:    RBC 3.56 (*)    Hemoglobin 8.3 (*)    HCT 26.9 (*)    MCV 75.5 (*)     MCH 23.2 (*)    MCHC 30.8 (*)    RDW 18.3 (*)    Platelets 136 (*)    All other components within normal limits  URINALYSIS, COMPLETE (UACMP) WITH MICROSCOPIC - Abnormal; Notable for the following:    Color, Urine YELLOW (*)    APPearance CLEAR (*)    Hgb urine dipstick MODERATE (*)    Squamous Epithelial / LPF 0-5 (*)    All other components within normal limits  LIPASE, BLOOD  PROTIME-INR  APTT  CBC  TYPE AND SCREEN   ____________________________________________  EKG  ED ECG REPORT I, Rockne Menghini, the attending physician, personally viewed and interpreted this ECG.   Date: 10/15/2016  EKG Time: 1906  Rate: 78  Rhythm: normal sinus rhythm  Axis: normal  Intervals:none  ST&T Change: No STEMI  ____________________________________________  RADIOLOGY  Ct Abdomen Pelvis W Contrast  Result Date: 10/15/2016 CLINICAL DATA:  Acute onset of hematemesis.  Initial encounter. EXAM: CT ABDOMEN AND PELVIS WITH CONTRAST TECHNIQUE: Multidetector CT imaging of the abdomen and pelvis was performed using the standard protocol following bolus administration of intravenous contrast. CONTRAST:  ISOVUE-300 IOPAMIDOL (ISOVUE-300) INJECTION 61% COMPARISON:  CT of the abdomen and pelvis performed 04/27/2016 FINDINGS: Lower chest: Minimal bibasilar atelectasis is noted. The visualized portions of the mediastinum are unremarkable. Hepatobiliary: There is a nodular contour to the liver, reflecting hepatic cirrhosis. There is apparent recanalization of the umbilical vein. Scattered prominent gastric and splenic varices are seen. There appears to be a small to moderate amount of nonocclusive thrombus within the portal venous system, extending partially into the main portal vein. The gallbladder is decompressed and not well assessed. There is only trace ascites noted tracking about the spleen. Pancreas: The pancreas is within normal limits. Spleen: The spleen is enlarged, measuring 20.0 cm  in length. Adrenals/Urinary Tract: The adrenal glands are unremarkable in appearance. A likely splenorenal shunt is noted. Mild nonspecific perinephric stranding is noted bilaterally. The kidneys are otherwise unremarkable. There is no evidence of hydronephrosis. No renal or ureteral stones are identified. Stomach/Bowel: The stomach is unremarkable in appearance. The small bowel is within normal limits. The appendix is normal in caliber, without evidence of appendicitis. The colon is unremarkable in appearance. Vascular/Lymphatic: The abdominal aorta is unremarkable in appearance. The inferior vena cava is grossly unremarkable. No retroperitoneal lymphadenopathy is seen. No pelvic sidewall lymphadenopathy is identified. Diffuse nonspecific retroperitoneal stranding is noted, of uncertain significance. Reproductive: The bladder is mildly distended and grossly unremarkable. The prostate remains normal in size. Other: No additional soft tissue abnormalities are seen. Musculoskeletal: No acute osseous abnormalities are identified. There is mild chronic loss of height involving vertebral bodies T7 and T11, with associated Schmorl's nodes.  The visualized musculature is unremarkable in appearance. IMPRESSION: 1. 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 2. Findings of hepatic cirrhosis, with recanalization of the umbilical vein, prominent gastric and splenic varices, and apparent splenorenal shunt. Trace ascites noted tracking about the spleen. 3. Significant splenomegaly noted. 4. Nonspecific retroperitoneal stranding. This may be related to the mild partial portal vein thrombosis. 5. Mild chronic loss of height involving vertebral bodies T7 and T11, with associated Schmorl's nodes. Electronically Signed   By: Roanna Raider M.D.   On: 10/15/2016 19:47     ____________________________________________   PROCEDURES  Procedure(s) performed: None  Procedures  Critical Care performed: No ____________________________________________   INITIAL IMPRESSION / ASSESSMENT AND PLAN / ED COURSE  Pertinent labs & imaging results that were available during my care of the patient were reviewed by me and considered in my medical decision making (see chart for details).  36 y.o. male with a history of esophageal varices and alcoholic liver cirrhosis presenting with vomiting that is blood streaked. On my examination, the patient is hemodynamically stable, but he does have guaiac positive stool. It is possible he has esophageal irritation from his vomiting, but given his history of esophageal varices and liver disease, and the fact that he is guaiac positive from below, makes me concerned about more dangerous etiologies including esophageal varices bleed, peptic ulcer gastric ulcers. I will treat him with intravenous fluids and a PPI; his H&H are stable and transfusion is not indicated at this time. The patient will require admission for continued monitoring, serial blood counts, and possible GI evaluation.  ----------------------------------------- 8:45 PM on 10/15/2016 -----------------------------------------  The patient continues to remain hemodynamically stable and has not had any further episodes of vomiting here. His blood counts have also remained stable. He has received intravenous fluids as well as Protonix. He has a CT scan which does not show any acute process, although a partial thrombus in the portal vein is found. [facility], [facility], and [facility] do not have any beds available at this time. I have made a call to the [facility] transfer center for further evaluation and treatment.  ----------------------------------------- 9:20 PM on 10/15/2016 -----------------------------------------  Patient has been accepted for transfer to [facility]. ____________________________________________  FINAL CLINICAL IMPRESSION(S) / ED DIAGNOSES  Final diagnoses:  Thrombus  Hematemesis, presence of nausea not specified  Heme positive stool  Left upper quadrant pain    Clinical Course as of Oct 15 2118  Wed Oct 15, 2016  [facility] also does not have any beds.  [facility] transfer center is being called.  [AN]    Clinical Course User Index [AN] Rockne Menghini, MD      NEW MEDICATIONS STARTED DURING THIS VISIT:  New Prescriptions   No medications on file      Rockne Menghini, MD 10/15/16 2047    Rockne Menghini, MD 10/15/16 2120

## 2016-10-15 NOTE — ED Notes (Signed)
Call [phone]) to give report for transfer to [facility]; pt has been assigned to bed 761.

## 2016-10-15 NOTE — ED Triage Notes (Signed)
Pt comes into the ED via POV c/o blood in his vomit.  Patient states this started on Monday and he came in but LWBS.  Patient has vomited twice today with a small amount of blood present.  Denies any fevers, chest pain, or increased shortness of breath from normal.  Patient presents in NAD at this time with even and unlabored respiration when sitting.  Patient has h/o cirrhosis, DM, HTN, depression, and schizophrenia.

## 2016-10-21 ENCOUNTER — Ambulatory Visit: Payer: Medicare Other | Attending: Internal Medicine

## 2016-11-03 ENCOUNTER — Emergency Department
Admission: EM | Admit: 2016-11-03 | Discharge: 2016-11-03 | Disposition: A | Discharge status: Home / Self Care | Payer: Medicare Other | Attending: Emergency Medicine | Admitting: Emergency Medicine

## 2016-11-03 ENCOUNTER — Encounter: Payer: Self-pay | Admitting: *Deleted

## 2016-11-03 DIAGNOSIS — I10 Essential (primary) hypertension: Secondary | ICD-10-CM | POA: Diagnosis not present

## 2016-11-03 DIAGNOSIS — Z79899 Other long term (current) drug therapy: Secondary | ICD-10-CM | POA: Insufficient documentation

## 2016-11-03 DIAGNOSIS — J449 Chronic obstructive pulmonary disease, unspecified: Secondary | ICD-10-CM | POA: Insufficient documentation

## 2016-11-03 DIAGNOSIS — Z7984 Long term (current) use of oral hypoglycemic drugs: Secondary | ICD-10-CM | POA: Insufficient documentation

## 2016-11-03 DIAGNOSIS — E119 Type 2 diabetes mellitus without complications: Secondary | ICD-10-CM | POA: Insufficient documentation

## 2016-11-03 DIAGNOSIS — R112 Nausea with vomiting, unspecified: Secondary | ICD-10-CM | POA: Diagnosis present

## 2016-11-03 LAB — COMPREHENSIVE METABOLIC PANEL
ALBUMIN: 3.6 g/dL (ref 3.5–5.0)
ALK PHOS: 115 U/L (ref 38–126)
ALT: 37 U/L (ref 17–63)
ANION GAP: 7 (ref 5–15)
AST: 51 U/L — ABNORMAL HIGH (ref 15–41)
BILIRUBIN TOTAL: 0.7 mg/dL (ref 0.3–1.2)
BUN: 13 mg/dL (ref 6–20)
CALCIUM: 8.6 mg/dL — AB (ref 8.9–10.3)
CO2: 31 mmol/L (ref 22–32)
Chloride: 99 mmol/L — ABNORMAL LOW (ref 101–111)
Creatinine, Ser: 1.04 mg/dL (ref 0.61–1.24)
GFR calc Af Amer: >60 mL/min (ref >60)
GFR calc non Af Amer: >60 mL/min (ref >60)
Glucose, Bld: 111 mg/dL — ABNORMAL HIGH (ref 65–99)
POTASSIUM: 5.4 mmol/L — AB (ref 3.5–5.1)
SODIUM: 137 mmol/L (ref 135–145)
TOTAL PROTEIN: 8.3 g/dL — AB (ref 6.5–8.1)

## 2016-11-03 LAB — CBC
HEMATOCRIT: 29.4 % — AB (ref 40.0–52.0)
HEMOGLOBIN: 9.1 g/dL — AB (ref 13.0–18.0)
MCH: 23.1 pg — ABNORMAL LOW (ref 26.0–34.0)
MCHC: 30.9 g/dL — ABNORMAL LOW (ref 32.0–36.0)
MCV: 74.9 fL — ABNORMAL LOW (ref 80.0–100.0)
Platelets: 167 10*3/uL (ref 150–440)
RBC: 3.93 MIL/uL — AB (ref 4.40–5.90)
RDW: 18.5 % — ABNORMAL HIGH (ref 11.5–14.5)
WBC: 8.6 10*3/uL (ref 3.8–10.6)

## 2016-11-03 LAB — LIPASE, BLOOD: Lipase: 28 U/L (ref 11–51)

## 2016-11-03 LAB — AMMONIA: Ammonia: 84 umol/L — ABNORMAL HIGH (ref 9–35)

## 2016-11-03 MED ORDER — ONDANSETRON HCL 4 MG/2ML IJ SOLN
4.0000 mg | Freq: Once | INTRAMUSCULAR | Status: AC
  Administered 2016-11-03: 4 mg via INTRAVENOUS
  Filled 2016-11-03: qty 2

## 2016-11-03 MED ORDER — SODIUM CHLORIDE 0.9 % IV BOLUS (SEPSIS)
1000.0000 mL | Freq: Once | INTRAVENOUS | Status: AC
  Administered 2016-11-03: 1000 mL via INTRAVENOUS

## 2016-11-03 MED ORDER — ONDANSETRON 4 MG PO TBDP: 4.0000 mg | ORAL_TABLET | Freq: Three times a day (TID) | ORAL | 0 refills | Status: DC | PRN

## 2016-11-03 NOTE — ED Provider Notes (Signed)
[facility] Emergency Department Provider Note  Time seen: 3:33 PM  I have reviewed the triage vital signs and the nursing notes.   HISTORY  Chief Complaint Emesis    HPI Howard [name] is a 36 y.o. male with a past medical history of alcoholic cirrhosis, anemia, COPD, diabetes, hypertension, hyperlipidemia, schizophrenia, who presents the emergency department after episode of vomiting. According to the patient and record review the patient recently had an esophageal banding procedure performed 10/17/16. At that time the patient was having hematemesis. Denies any hematemesis since. He states he vomited this morning and was concerned that he might have disrupted the banding so he came to the emergency department. Patient denies any black or bloody vomit. Has not vomited since. Denies any current nausea at this time. Denies any abdominal pain. Patient also states he has a little shaky this morning which happens when his ammonia elevated so he would like that checked as well. Currently the patient appears well, no distress at this time.  Past Medical History:  Diagnosis Date  . Alcoholic cirrhosis of liver with ascites (HCC)   . Anemia   . COPD (chronic obstructive pulmonary disease) (HCC)   . Depression   . Diabetes (HCC)   . Diabetes mellitus, type II (HCC)   . Esophageal varices (HCC)   . Heart disease   . Hyperlipemia   . Hypertension   . Liver disease   . Multiple thyroid nodules   . Portal hypertensive gastropathy   . Schizophrenia [facility](HCC)     Patient Active Problem List   Diagnosis Date Noted  . GIB (gastrointestinal bleeding) 10/05/2016  . Ascites 10/04/2016  . DNR (do not resuscitate) discussion 08/11/2016  . Palliative care by specialist 08/11/2016  . Muscle weakness (generalized)   . Acute upper GI bleed   . GI bleed 08/07/2016  . Alcoholic cirrhosis of liver (HCC) 07/09/2016  . Acute respiratory failure with hypercapnia (HCC) 07/09/2016  .  Hyperglycemia 07/09/2016  . Seizure (HCC) 06/10/2016  . Altered mental status 05/12/2016  . Headache 04/29/2016  . OSA (obstructive sleep apnea) 04/29/2016  . Anemia 04/29/2016  . Thrombocytopenia (HCC) 04/29/2016  . Coagulopathy (HCC) 04/29/2016  . Obesity 04/29/2016  . Generalized weakness 04/29/2016  . Acute hepatic encephalopathy 03/14/2016  . Controlled type 2 diabetes mellitus without complication (HCC) 01/15/2016  . Normocytic anemia 12/12/2015  . Essential (primary) hypertension 12/11/2015  . Pure hypercholesterolemia 12/11/2015  . Fever 11/11/2015  . Ascites due to alcoholic cirrhosis (HCC) 11/11/2015  . Cirrhosis of liver with ascites (HCC) 11/11/2015  . Hepatic encephalopathy (HCC) 10/16/2015  . Type 2 diabetes mellitus (HCC) 10/16/2015  . Acute renal failure (ARF) (HCC) 07/30/2015  . 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 Alcoholic cirrhosis (HCC) 07/19/2015  . Hypoxia 07/04/2015  . Elevated transaminase level 07/04/2015  . DOE (dyspnea on exertion) 07/04/2015  . Alcohol abuse 05/31/2015  . Hypertension 05/29/2015  . Paranoid schizophrenia (HCC) 03/20/2015    Past Surgical History:  Procedure Laterality Date  . ESOPHAGOGASTRODUODENOSCOPY N/A 10/05/2015   Procedure: ESOPHAGOGASTRODUODENOSCOPY (EGD);  Surgeon: Elnita MaxwellMatthew Gordon Rein, MD;  Location[facility] END[facility]COPY;  Service: Endoscopy;  Laterality: N/A;  . ESOPHAGOGASTRODUODENOSCOPY (EGD) WITH PROPOFOL N/A 08/03/2015   Procedure: ESOPHAGOGASTRODUODENOSCOPY (EGD) WITH PROPOFOL;  Surgeon: Elnita MaxwellMatthew Gordon Rein, MD;  [facility]COPY;  Service: Endoscopy;  Laterality: N/A;  . ESOPHAGOGASTRODUODENOSCOPY (EGD) WITH PROPOFOL N/A 08/31/2015   Procedure: ESOPHAGOGASTRODUODENOSCOPY (EGD) WITH PROPOFOL;  Surgeon: Elnita MaxwellMatthew Gordon Rein, MD;  Location:  [facility] ENDOSCOPY;  Service: Endoscopy;  Laterality: N/A;  . ESOPHAGOGASTRODUODENOSCOPY (EGD) WITH PROPOFOL N/A 04/04/2016   Procedure: ESOPHAGOGASTRODUODENOSCOPY  (EGD) WITH PROPOFOL;  Surgeon: Scot Jun, MD;  Location: [facility] ENDOSCOPY;  Service: Endoscopy;  Laterality: N/A;  . NO PAST SURGERIES      Prior to Admission medications   Medication Sig Start Date End Date Taking? Authorizing Provider  acamprosate (CAMPRAL) 333 MG tablet Take 2 tablets (666 mg total) by mouth 3 (three) times daily with meals. 08/19/16   Audery Amel, MD  albuterol (PROVENTIL HFA;VENTOLIN HFA) 108 (90 Base) MCG/ACT inhaler Inhale 2 puffs into the lungs every 6 (six) hours as needed for wheezing or shortness of breath. 10/08/16   Shaune Pollack, MD  citalopram (CELEXA) 20 MG tablet Take 1 tablet (20 mg total) by mouth every morning. 08/19/16   Audery Amel, MD  furosemide (LASIX) 20 MG tablet Take 1 tablet (20 mg total) by mouth daily. 05/15/16   Alford Highland, MD  lactulose (CHRONULAC) 10 GM/15ML solution Take 45 mLs (30 g total) by mouth every 4 (four) hours. 07/09/16   Katharina Caper, MD  levETIRAcetam (KEPPRA) 1000 MG tablet Take 1 tablet (1,000 mg total) by mouth 2 (two) times daily. 07/09/16   Katharina Caper, MD  losartan (COZAAR) 25 MG tablet Take 25 mg by mouth daily.    Historical Provider, MD  metFORMIN (GLUCOPHAGE) 500 MG tablet Take 1 tablet (500 mg total) by mouth 2 (two) times daily with a meal. 05/15/16   Alford Highland, MD  modafinil (PROVIGIL) 100 MG tablet Take 1 tablet (100 mg total) by mouth daily. 08/13/16   Auburn Bilberry, MD  Multiple Vitamin (MULTIVITAMIN WITH MINERALS) TABS tablet Take 1 tablet by mouth daily. 08/04/15   Ramonita Lab, MD  nadolol (CORGARD) 20 MG tablet Take 20 mg by mouth daily.    Historical Provider, MD  OLANZapine (ZYPREXA) 15 MG tablet Take 2 tablets (30 mg total) by mouth at bedtime. 05/22/16   Audery Amel, MD  ondansetron (ZOFRAN) 4 MG tablet Take 1 tablet (4 mg total) by mouth every 8 (eight) hours as needed for nausea or vomiting. 08/26/16   Governor Rooks, MD  pantoprazole (PROTONIX) 40 MG tablet Take 1 tablet (40 mg total) by  mouth 2 (two) times daily before a meal. 10/08/16   Shaune Pollack, MD  rifaximin (XIFAXAN) 550 MG TABS tablet Take 1 tablet (550 mg total) by mouth 2 (two) times daily. 07/09/16   Katharina Caper, MD  spironolactone (ALDACTONE) 50 MG tablet Take 1 tablet (50 mg total) by mouth 2 (two) times daily. 07/09/16   Katharina Caper, MD  sucralfate (CARAFATE) 1 g tablet Take 1 g by mouth 4 (four) times daily.    Historical Provider, MD  traZODone (DESYREL) 100 MG tablet Take 1 tablet (100 mg total) by mouth at bedtime as needed for sleep. 10/07/16   Audery Amel, MD    No Known Allergies  Family History  Problem Relation Age of Onset  . Heart disease Mother   . Hypertension Mother   . Hyperlipidemia Mother   . Stroke Father   . Heart attack Father   . Hypertension Father   . Heart disease Father   . Alcohol abuse Father   . Heart disease Brother     Social History Social History  Substance Use Topics  . Smoking status: Never Smoker  . Smokeless tobacco: Never Used  . Alcohol use 30.0 oz/week    50 Cans of  beer per week     Comment: last drink monday 10/13/16    Review of Systems Constitutional: Negative for fever Cardiovascular: Negative for chest pain. Respiratory: Negative for shortness of breath. Gastrointestinal: Negative for abdominal pain. Positive for vomiting 1 this morning. None since. Genitourinary: Negative for dysuria. Neurological: Negative for headache 10-point ROS otherwise negative.  ____________________________________________   PHYSICAL EXAM:  VITAL SIGNS: ED Triage Vitals [11/03/16 1354]  Enc Vitals Group     BP (!) 124/43     Pulse Rate 79     Resp 16     Temp 98.1 F (36.7 C)     Temp Source Oral     SpO2 95 %     Weight (!) 360 lb (163.3 kg)     Height 6\' 3"  (1.905 m)     Head Circumference      Peak Flow      Pain Score      Pain Loc      Pain Edu?      Excl. in GC?     Constitutional: Alert and oriented, Able to converse normally, no signs of  confusion.. Well appearing and in no distress. Eyes: Normal exam ENT   Head: Normocephalic and atraumatic.   Mouth/Throat: Mucous membranes are moist. Cardiovascular: Normal rate, regular rhythm.  Respiratory: Normal respiratory effort without tachypnea nor retractions. Breath sounds are clear  Gastrointestinal: Soft and nontender. No distention Musculoskeletal: Nontender with normal range of motion in all extremities. Neurologic:  Normal speech and language. No gross focal neurologic deficits Skin:  Skin is warm, dry and intact.  Psychiatric: Mood and affect are normal.    INITIAL IMPRESSION / ASSESSMENT AND PLAN / ED COURSE  Pertinent labs & imaging results that were available during my care of the patient were reviewed by me and considered in my medical decision making (see chart for details).  The patient presents to the emergency department with nausea one episode of vomiting this morning. States his symptoms have resolved but he was concerned that he may have disrupted a recent esophageal banding procedure. Denies any black or bloody vomit. Overall the patient appears nontoxic, chronically ill but in no acute distress. Patient's labs are largely at his baseline his ammonia has gone up since discharged from [facility]. His mother states the patient is only take his lactulose once daily when he is supposed be taking it 3 times daily. I discussed this with the patient is agreeable to begin taking it 3 times daily for the patient appears well and no signs of confusion or altered mental status at this time. Otherwise his labs are largely at his baseline mild hyperkalemia which again is largely at the patient's baseline. Hemoglobin of 9.1 which is somewhat increased from the patient's baseline. We will treat with IV fluids, provide a round of IV Zofran and monitor closely in the emergency department if the patient continues to appear well with no further symptoms we will likely discharge with  Zofran as needed and follow-up with his GI specialist.  Patient continues to appear well. We'll discharge home with Zofran and GI follow-up. Patient understands he needs to take lactulose 3 times daily. Discussed return precautions for any confusion bloody vomitus black or bloody stool.  ____________________________________________   FINAL CLINICAL IMPRESSION(S) / ED DIAGNOSES  Nausea and vomiting    Minna Antis, MD 11/03/16 1706

## 2016-11-03 NOTE — Discharge Instructions (Signed)
As we discussed please call your GI doctor to arrange a follow-up appointment as soon as possible. Please take nausea medication as needed, as prescribed. Return to the emergency department for any confusion, black or bloody vomit, black or bloody stool, abdominal pain, or any other symptom personally concerning to yourself. Again it is extremely important to take her lactulose 3 times daily as prescribed every day.

## 2016-11-03 NOTE — ED Notes (Signed)
Waiting on fluids to finish for discharge 

## 2016-11-03 NOTE — ED Triage Notes (Signed)
Pt presents to ED reporting one episode of dry heaving and fear that he could have complicated his surgery (ateries were banded together) from last week. Pt denies NVD, fevers or coughing at home. Pt also requesting ammonia levels to be checked because he has had high levels in the past and reports onset of shaking today which was a sign last time. Pt is alert and oriented at this time.

## 2016-11-03 NOTE — ED Notes (Signed)
Ambulated to bathroom without difficulty

## 2016-11-12 ENCOUNTER — Inpatient Hospital Stay
Admission: EM | Admit: 2016-11-12 | Discharge: 2016-11-14 | DRG: 441 | Disposition: A | Discharge status: Home / Self Care | Payer: Medicare Other | Attending: Internal Medicine | Admitting: Internal Medicine

## 2016-11-12 ENCOUNTER — Encounter: Payer: Self-pay | Admitting: Emergency Medicine

## 2016-11-12 DIAGNOSIS — K921 Melena: Secondary | ICD-10-CM | POA: Diagnosis present

## 2016-11-12 DIAGNOSIS — F329 Major depressive disorder, single episode, unspecified: Secondary | ICD-10-CM | POA: Diagnosis present

## 2016-11-12 DIAGNOSIS — Z823 Family history of stroke: Secondary | ICD-10-CM | POA: Diagnosis not present

## 2016-11-12 DIAGNOSIS — Z8249 Family history of ischemic heart disease and other diseases of the circulatory system: Secondary | ICD-10-CM | POA: Diagnosis not present

## 2016-11-12 DIAGNOSIS — K7290 Hepatic failure, unspecified without coma: Secondary | ICD-10-CM | POA: Diagnosis present

## 2016-11-12 DIAGNOSIS — Z9119 Patient's noncompliance with other medical treatment and regimen: Secondary | ICD-10-CM | POA: Diagnosis not present

## 2016-11-12 DIAGNOSIS — I81 Portal vein thrombosis: Secondary | ICD-10-CM | POA: Diagnosis present

## 2016-11-12 DIAGNOSIS — E785 Hyperlipidemia, unspecified: Secondary | ICD-10-CM | POA: Diagnosis present

## 2016-11-12 DIAGNOSIS — F209 Schizophrenia, unspecified: Secondary | ICD-10-CM | POA: Diagnosis present

## 2016-11-12 DIAGNOSIS — Z811 Family history of alcohol abuse and dependence: Secondary | ICD-10-CM | POA: Diagnosis not present

## 2016-11-12 DIAGNOSIS — R140 Abdominal distension (gaseous): Secondary | ICD-10-CM

## 2016-11-12 DIAGNOSIS — R188 Other ascites: Secondary | ICD-10-CM

## 2016-11-12 DIAGNOSIS — K7030 Alcoholic cirrhosis of liver without ascites: Secondary | ICD-10-CM | POA: Diagnosis present

## 2016-11-12 DIAGNOSIS — G4730 Sleep apnea, unspecified: Secondary | ICD-10-CM | POA: Diagnosis present

## 2016-11-12 DIAGNOSIS — Z9114 Patient's other noncompliance with medication regimen: Secondary | ICD-10-CM

## 2016-11-12 DIAGNOSIS — K920 Hematemesis: Secondary | ICD-10-CM | POA: Diagnosis present

## 2016-11-12 DIAGNOSIS — Z6841 Body Mass Index (BMI) 40.0 and over, adult: Secondary | ICD-10-CM

## 2016-11-12 DIAGNOSIS — E119 Type 2 diabetes mellitus without complications: Secondary | ICD-10-CM | POA: Diagnosis present

## 2016-11-12 DIAGNOSIS — I10 Essential (primary) hypertension: Secondary | ICD-10-CM | POA: Diagnosis present

## 2016-11-12 DIAGNOSIS — J449 Chronic obstructive pulmonary disease, unspecified: Secondary | ICD-10-CM | POA: Diagnosis present

## 2016-11-12 DIAGNOSIS — K7682 Hepatic encephalopathy: Secondary | ICD-10-CM | POA: Diagnosis present

## 2016-11-12 LAB — URINALYSIS, COMPLETE (UACMP) WITH MICROSCOPIC
BILIRUBIN URINE: NEGATIVE
Bacteria, UA: NONE SEEN
Glucose, UA: NEGATIVE mg/dL
KETONES UR: NEGATIVE mg/dL
LEUKOCYTES UA: NEGATIVE
NITRITE: NEGATIVE
Protein, ur: NEGATIVE mg/dL
Specific Gravity, Urine: 1.012 (ref 1.005–1.030)
pH: 7 (ref 5.0–8.0)

## 2016-11-12 LAB — COMPREHENSIVE METABOLIC PANEL
ALT: 31 U/L (ref 17–63)
AST: 43 U/L — ABNORMAL HIGH (ref 15–41)
Albumin: 3.5 g/dL (ref 3.5–5.0)
Alkaline Phosphatase: 127 U/L — ABNORMAL HIGH (ref 38–126)
Anion gap: 8 (ref 5–15)
BUN: 11 mg/dL (ref 6–20)
CO2: 33 mmol/L — ABNORMAL HIGH (ref 22–32)
Calcium: 8.5 mg/dL — ABNORMAL LOW (ref 8.9–10.3)
Chloride: 100 mmol/L — ABNORMAL LOW (ref 101–111)
Creatinine, Ser: 0.91 mg/dL (ref 0.61–1.24)
GFR calc Af Amer: >60 mL/min (ref >60)
GFR calc non Af Amer: >60 mL/min (ref >60)
Glucose, Bld: 178 mg/dL — ABNORMAL HIGH (ref 65–99)
Potassium: 5.1 mmol/L (ref 3.5–5.1)
Sodium: 141 mmol/L (ref 135–145)
Total Bilirubin: 0.5 mg/dL (ref 0.3–1.2)
Total Protein: 7.8 g/dL (ref 6.5–8.1)

## 2016-11-12 LAB — CBC
HCT: 28.3 % — ABNORMAL LOW (ref 40.0–52.0)
Hemoglobin: 8.6 g/dL — ABNORMAL LOW (ref 13.0–18.0)
MCH: 23.3 pg — ABNORMAL LOW (ref 26.0–34.0)
MCHC: 30.4 g/dL — ABNORMAL LOW (ref 32.0–36.0)
MCV: 76.6 fL — ABNORMAL LOW (ref 80.0–100.0)
Platelets: 116 10*3/uL — ABNORMAL LOW (ref 150–440)
RBC: 3.69 MIL/uL — ABNORMAL LOW (ref 4.40–5.90)
RDW: 18.1 % — ABNORMAL HIGH (ref 11.5–14.5)
WBC: 6.1 10*3/uL (ref 3.8–10.6)

## 2016-11-12 LAB — GLUCOSE, CAPILLARY
GLUCOSE-CAPILLARY: 63 mg/dL — AB (ref 65–99)
GLUCOSE-CAPILLARY: 98 mg/dL (ref 65–99)
Glucose-Capillary: 65 mg/dL (ref 65–99)
Glucose-Capillary: 73 mg/dL (ref 65–99)

## 2016-11-12 LAB — IRON AND TIBC
Iron: 20 ug/dL — ABNORMAL LOW (ref 45–182)
Saturation Ratios: 4 % — ABNORMAL LOW (ref 17.9–39.5)
TIBC: 494 ug/dL — ABNORMAL HIGH (ref 250–450)
UIBC: 474 ug/dL

## 2016-11-12 LAB — AMMONIA: AMMONIA: 134 umol/L — AB (ref 9–35)

## 2016-11-12 LAB — LIPASE, BLOOD: LIPASE: 33 U/L (ref 11–51)

## 2016-11-12 LAB — PROTIME-INR
INR: 1.16
Prothrombin Time: 14.9 seconds (ref 11.4–15.2)

## 2016-11-12 LAB — FERRITIN: Ferritin: 5 ng/mL — ABNORMAL LOW (ref 24–336)

## 2016-11-12 MED ORDER — INSULIN ASPART 100 UNIT/ML ~~LOC~~ SOLN
0.0000 [IU] | Freq: Three times a day (TID) | SUBCUTANEOUS | Status: DC
  Administered 2016-11-13: 08:00:00 2 [IU] via SUBCUTANEOUS
  Administered 2016-11-13 – 2016-11-14 (×3): 1 [IU] via SUBCUTANEOUS
  Filled 2016-11-12: qty 2
  Filled 2016-11-12 (×3): qty 1

## 2016-11-12 MED ORDER — ONDANSETRON 4 MG PO TBDP
4.0000 mg | ORAL_TABLET | Freq: Three times a day (TID) | ORAL | Status: DC | PRN
  Filled 2016-11-12: qty 1

## 2016-11-12 MED ORDER — ACETAMINOPHEN 325 MG PO TABS
650.0000 mg | ORAL_TABLET | Freq: Four times a day (QID) | ORAL | Status: DC | PRN
  Administered 2016-11-13: 21:00:00 650 mg via ORAL
  Filled 2016-11-12: qty 2

## 2016-11-12 MED ORDER — SODIUM CHLORIDE 0.9 % IV SOLN
8.0000 mg/h | INTRAVENOUS | Status: DC
  Administered 2016-11-12 – 2016-11-13 (×2): 8 mg/h via INTRAVENOUS
  Filled 2016-11-12 (×2): qty 80

## 2016-11-12 MED ORDER — ALBUTEROL SULFATE (2.5 MG/3ML) 0.083% IN NEBU: 2.5000 mg | INHALATION_SOLUTION | Freq: Four times a day (QID) | RESPIRATORY_TRACT | Status: DC | PRN

## 2016-11-12 MED ORDER — TRAZODONE HCL 100 MG PO TABS
100.0000 mg | ORAL_TABLET | Freq: Every evening | ORAL | Status: DC | PRN
  Administered 2016-11-13: 21:00:00 100 mg via ORAL
  Filled 2016-11-12: qty 1

## 2016-11-12 MED ORDER — LACTULOSE 10 GM/15ML PO SOLN
30.0000 g | Freq: Once | ORAL | Status: AC
  Administered 2016-11-12: 30 g via ORAL
  Filled 2016-11-12: qty 60

## 2016-11-12 MED ORDER — PANTOPRAZOLE SODIUM 40 MG PO TBEC: 40.0000 mg | DELAYED_RELEASE_TABLET | Freq: Two times a day (BID) | ORAL | Status: DC

## 2016-11-12 MED ORDER — ACAMPROSATE CALCIUM 333 MG PO TBEC
666.0000 mg | DELAYED_RELEASE_TABLET | Freq: Three times a day (TID) | ORAL | Status: DC
  Administered 2016-11-12 – 2016-11-14 (×5): 666 mg via ORAL
  Filled 2016-11-12 (×5): qty 2

## 2016-11-12 MED ORDER — RIFAXIMIN 550 MG PO TABS
550.0000 mg | ORAL_TABLET | Freq: Two times a day (BID) | ORAL | Status: DC
  Administered 2016-11-12 – 2016-11-14 (×4): 550 mg via ORAL
  Filled 2016-11-12 (×4): qty 1

## 2016-11-12 MED ORDER — ONDANSETRON HCL 4 MG PO TABS: 4.0000 mg | ORAL_TABLET | Freq: Three times a day (TID) | ORAL | Status: DC | PRN

## 2016-11-12 MED ORDER — SUCRALFATE 1 G PO TABS
1.0000 g | ORAL_TABLET | Freq: Four times a day (QID) | ORAL | Status: DC
  Administered 2016-11-12 – 2016-11-14 (×6): 1 g via ORAL
  Filled 2016-11-12 (×6): qty 1

## 2016-11-12 MED ORDER — LEVETIRACETAM 500 MG PO TABS
1000.0000 mg | ORAL_TABLET | Freq: Two times a day (BID) | ORAL | Status: DC
  Administered 2016-11-12 – 2016-11-14 (×4): 1000 mg via ORAL
  Filled 2016-11-12 (×5): qty 2

## 2016-11-12 MED ORDER — FUROSEMIDE 20 MG PO TABS
20.0000 mg | ORAL_TABLET | Freq: Every day | ORAL | Status: DC
  Administered 2016-11-12 – 2016-11-14 (×3): 20 mg via ORAL
  Filled 2016-11-12 (×3): qty 1

## 2016-11-12 MED ORDER — DEXTROSE 5 % IV SOLN: 1.0000 g | INTRAVENOUS | Status: DC

## 2016-11-12 MED ORDER — DEXTROSE 5 % IV SOLN
1.0000 g | INTRAVENOUS | Status: DC
  Administered 2016-11-12: 1 g via INTRAVENOUS
  Filled 2016-11-12 (×2): qty 10

## 2016-11-12 MED ORDER — SODIUM CHLORIDE 0.9 % IV SOLN
50.0000 ug/h | INTRAVENOUS | Status: DC
  Administered 2016-11-12 – 2016-11-13 (×2): 50 ug/h via INTRAVENOUS
  Filled 2016-11-12 (×4): qty 1

## 2016-11-12 MED ORDER — NADOLOL 20 MG PO TABS
20.0000 mg | ORAL_TABLET | Freq: Every day | ORAL | Status: DC
  Administered 2016-11-12 – 2016-11-14 (×3): 20 mg via ORAL
  Filled 2016-11-12 (×3): qty 1

## 2016-11-12 MED ORDER — MODAFINIL 100 MG PO TABS
100.0000 mg | ORAL_TABLET | Freq: Every day | ORAL | Status: DC
  Administered 2016-11-13 – 2016-11-14 (×2): 100 mg via ORAL
  Filled 2016-11-12 (×2): qty 1

## 2016-11-12 MED ORDER — SPIRONOLACTONE 25 MG PO TABS
50.0000 mg | ORAL_TABLET | Freq: Two times a day (BID) | ORAL | Status: DC
  Administered 2016-11-12 – 2016-11-14 (×4): 50 mg via ORAL
  Filled 2016-11-12 (×4): qty 2

## 2016-11-12 MED ORDER — DEXTROSE-NACL 5-0.45 % IV SOLN
INTRAVENOUS | Status: DC
  Administered 2016-11-12 – 2016-11-13 (×2): via INTRAVENOUS

## 2016-11-12 MED ORDER — LOSARTAN POTASSIUM 50 MG PO TABS
25.0000 mg | ORAL_TABLET | Freq: Every day | ORAL | Status: DC
  Administered 2016-11-12 – 2016-11-14 (×3): 25 mg via ORAL
  Filled 2016-11-12 (×3): qty 1

## 2016-11-12 MED ORDER — SODIUM CHLORIDE 0.9% FLUSH
3.0000 mL | Freq: Two times a day (BID) | INTRAVENOUS | Status: DC
  Administered 2016-11-12 – 2016-11-14 (×5): 3 mL via INTRAVENOUS

## 2016-11-12 MED ORDER — OLANZAPINE 10 MG PO TABS
30.0000 mg | ORAL_TABLET | Freq: Every day | ORAL | Status: DC
  Administered 2016-11-12 – 2016-11-13 (×2): 30 mg via ORAL
  Filled 2016-11-12 (×2): qty 3

## 2016-11-12 MED ORDER — CITALOPRAM HYDROBROMIDE 20 MG PO TABS
20.0000 mg | ORAL_TABLET | ORAL | Status: DC
  Administered 2016-11-13 – 2016-11-14 (×2): 20 mg via ORAL
  Filled 2016-11-12 (×2): qty 1

## 2016-11-12 MED ORDER — ADULT MULTIVITAMIN W/MINERALS CH
1.0000 | ORAL_TABLET | Freq: Every day | ORAL | Status: DC
  Administered 2016-11-12 – 2016-11-14 (×3): 1 via ORAL
  Filled 2016-11-12 (×3): qty 1

## 2016-11-12 MED ORDER — SODIUM CHLORIDE 0.9% FLUSH
3.0000 mL | INTRAVENOUS | Status: DC | PRN
  Filled 2016-11-12: qty 3

## 2016-11-12 MED ORDER — ONDANSETRON HCL 4 MG/2ML IJ SOLN: 4.0000 mg | Freq: Four times a day (QID) | INTRAMUSCULAR | Status: DC | PRN

## 2016-11-12 MED ORDER — ACETAMINOPHEN 650 MG RE SUPP: 650.0000 mg | Freq: Four times a day (QID) | RECTAL | Status: DC | PRN

## 2016-11-12 MED ORDER — METFORMIN HCL 500 MG PO TABS: 500.0000 mg | ORAL_TABLET | Freq: Two times a day (BID) | ORAL | Status: DC

## 2016-11-12 MED ORDER — LACTULOSE 10 GM/15ML PO SOLN
30.0000 g | ORAL | Status: DC
  Administered 2016-11-12 – 2016-11-14 (×9): 30 g via ORAL
  Filled 2016-11-12 (×10): qty 60

## 2016-11-12 MED ORDER — SODIUM CHLORIDE 0.9 % IV SOLN: 250.0000 mL | INTRAVENOUS | Status: DC | PRN

## 2016-11-12 MED ORDER — ONDANSETRON HCL 4 MG PO TABS: 4.0000 mg | ORAL_TABLET | Freq: Four times a day (QID) | ORAL | Status: DC | PRN

## 2016-11-12 MED ORDER — SODIUM CHLORIDE 0.9 % IV SOLN
80.0000 mg | Freq: Once | INTRAVENOUS | Status: AC
  Administered 2016-11-12: 80 mg via INTRAVENOUS
  Filled 2016-11-12: qty 80

## 2016-11-12 MED ORDER — OCTREOTIDE LOAD VIA INFUSION
50.0000 ug | Freq: Once | INTRAVENOUS | Status: AC
  Administered 2016-11-12: 50 ug via INTRAVENOUS
  Filled 2016-11-12: qty 25

## 2016-11-12 NOTE — ED Notes (Signed)
Pt complaining of chest pain , non radiating , admit MD Allena KatzPatel aware

## 2016-11-12 NOTE — Consult Note (Signed)
Wyline Mood MD  [address] [place], [place] [address] Phone: [phone] Fax : [phone]  Consultation  Referring Provider:     Dr Allena Katz  Primary Care Physician:  Marisue Ivan, MD Primary Gastroenterologist:  Dr. Markham Jordan          Reason for Consultation:     GI bleed   Date of Admission:  11/12/2016 Date of Consultation:  11/12/2016         HPI:   Howard [name] is a 36 y.o. male with a history of liver cirrhosis, esophageal varices with bleeding and recent banding . Last episode was 10/16/16 when he was banded at [facility] where two bands were placed when he presented with hematemesis. He has a history of hepatic encephalopathy , ascites. His cirrhosis has been attributed to alcohol abuse. He has been non complaint in the past.   Drinks 6 cans of beer a day since age of 64, last drink was yesterday , no nsaid use. Says he had a bit of spotting when he threw up yesterday and his stool today was dark but not black.   Today he is admitted with hematemesis and melena . Hb today is at baseline. He does have a low MCV at 75 . No INR available.   Last CT scan of the abdomen showed non occlusive thrombus extending into the main portal vein, prominent gastric, splenic varices , splenorenal shint , trace ascites and significant splenomegaly   Past Medical History:  Diagnosis Date  . Alcoholic cirrhosis of liver with ascites (HCC)   . Anemia   . COPD (chronic obstructive pulmonary disease) (HCC)   . Depression   . Diabetes (HCC)   . Diabetes mellitus, type II (HCC)   . Esophageal varices (HCC)   . Heart disease   . Hyperlipemia   . Hypertension   . Liver disease   . Multiple thyroid nodules   . Portal hypertensive gastropathy   . Schizophrenia [facility])     Past Surgical History:  Procedure Laterality Date  . ESOPHAGOGASTRODUODENOSCOPY N/A 10/05/2015   Procedure: ESOPHAGOGASTRODUODENOSCOPY (EGD);  Surgeon: Elnita Maxwell, MD;  Location: [facility] ENDOSCOPY;  Service: Endoscopy;   Laterality: N/A;  . ESOPHAGOGASTRODUODENOSCOPY (EGD) WITH PROPOFOL N/A 08/03/2015   Procedure: ESOPHAGOGASTRODUODENOSCOPY (EGD) WITH PROPOFOL;  Surgeon: Elnita Maxwell, MD;  Location: [facility] ENDOSCOPY;  Service: Endoscopy;  Laterality: N/A;  . ESOPHAGOGASTRODUODENOSCOPY (EGD) WITH PROPOFOL N/A 08/31/2015   Procedure: ESOPHAGOGASTRODUODENOSCOPY (EGD) WITH PROPOFOL;  Surgeon: Elnita Maxwell, MD;  Location: [facility] ENDOSCOPY;  Service: Endoscopy;  Laterality: N/A;  . ESOPHAGOGASTRODUODENOSCOPY (EGD) WITH PROPOFOL N/A 04/04/2016   Procedure: ESOPHAGOGASTRODUODENOSCOPY (EGD) WITH PROPOFOL;  Surgeon: Scot Jun, MD;  Location: [facility] ENDOSCOPY;  Service: Endoscopy;  Laterality: N/A;  . NO PAST SURGERIES      Prior to Admission medications   Medication Sig Start Date End Date Taking? Authorizing Provider  acamprosate (CAMPRAL) 333 MG tablet Take 2 tablets (666 mg total) by mouth 3 (three) times daily with meals. 08/19/16  Yes Audery Amel, MD  albuterol (PROVENTIL HFA;VENTOLIN HFA) 108 (90 Base) MCG/ACT inhaler Inhale 2 puffs into the lungs every 6 (six) hours as needed for wheezing or shortness of breath. 10/08/16  Yes Shaune Pollack, MD  citalopram (CELEXA) 20 MG tablet Take 1 tablet (20 mg total) by mouth every morning. 08/19/16  Yes Audery Amel, MD  furosemide (LASIX) 20 MG tablet Take 1 tablet (20 mg total) by mouth daily. 05/15/16  Yes Alford Highland, MD  lactulose [facility])  10 GM/15ML solution Take 45 mLs (30 g total) by mouth every 4 (four) hours. 07/09/16  Yes Katharina Caper, MD  levETIRAcetam (KEPPRA) 1000 MG tablet Take 1 tablet (1,000 mg total) by mouth 2 (two) times daily. Patient taking differently: Take 750 mg by mouth 2 (two) times daily.  07/09/16  Yes Katharina Caper, MD  losartan (COZAAR) 25 MG tablet Take 25 mg by mouth daily.   Yes Historical Provider, MD  metFORMIN (GLUCOPHAGE) 500 MG tablet Take 1 tablet (500 mg total) by mouth 2 (two) times daily with a meal. 05/15/16  Yes  Alford Highland, MD  Multiple Vitamin (MULTIVITAMIN WITH MINERALS) TABS tablet Take 1 tablet by mouth daily. 08/04/15  Yes Ramonita Lab, MD  nadolol (CORGARD) 20 MG tablet Take 20 mg by mouth daily.   Yes Historical Provider, MD  OLANZapine (ZYPREXA) 15 MG tablet Take 2 tablets (30 mg total) by mouth at bedtime. 05/22/16  Yes Audery Amel, MD  ondansetron (ZOFRAN) 4 MG tablet Take 1 tablet (4 mg total) by mouth every 8 (eight) hours as needed for nausea or vomiting. 08/26/16  Yes Governor Rooks, MD  pantoprazole (PROTONIX) 40 MG tablet Take 1 tablet (40 mg total) by mouth 2 (two) times daily before a meal. 10/08/16  Yes Shaune Pollack, MD  rifaximin (XIFAXAN) 550 MG TABS tablet Take 1 tablet (550 mg total) by mouth 2 (two) times daily. 07/09/16  Yes Katharina Caper, MD  spironolactone (ALDACTONE) 50 MG tablet Take 1 tablet (50 mg total) by mouth 2 (two) times daily. 07/09/16  Yes Katharina Caper, MD  sucralfate (CARAFATE) 1 g tablet Take 1 g by mouth 4 (four) times daily.   Yes Historical Provider, MD  traZODone (DESYREL) 100 MG tablet Take 1 tablet (100 mg total) by mouth at bedtime as needed for sleep. 10/07/16  Yes Audery Amel, MD  modafinil (PROVIGIL) 100 MG tablet Take 1 tablet (100 mg total) by mouth daily. Patient not taking: Reported on 11/12/2016 08/13/16   Auburn Bilberry, MD    Family History  Problem Relation Age of Onset  . Heart disease Mother   . Hypertension Mother   . Hyperlipidemia Mother   . Stroke Father   . Heart attack Father   . Hypertension Father   . Heart disease Father   . Alcohol abuse Father   . Heart disease Brother      Social History  Substance Use Topics  . Smoking status: Never Smoker  . Smokeless tobacco: Never Used  . Alcohol use 30.0 oz/week    50 Cans of beer per week     Comment: last drink monday 10/13/16    Allergies as of 11/12/2016  . (No Known Allergies)    Review of Systems:    All systems reviewed and negative except where noted in HPI.    Physical Exam:  Vital signs in last 24 hours: Temp:  [98.3 F (36.8 C)-98.6 F (37 C)] 98.3 F (36.8 C) (02/21 1513) Pulse Rate:  [81-95] 81 (02/21 1513) Resp:  [16-23] 20 (02/21 1513) BP: (139-158)/(80-102) 158/83 (02/21 1513) SpO2:  [92 %-99 %] 96 % (02/21 1513) FiO2 (%):  [28 %] 28 % (02/21 1359) Weight:  [360 lb (163.3 kg)-361 lb 4.8 oz (163.9 kg)] 361 lb 4.8 oz (163.9 kg) (02/21 1539) Last BM Date: 11/12/16 General:   Pleasant, cooperative in NAD Head:  Normocephalic and atraumatic. Eyes:   No icterus.   Conjunctiva pink. PERRLA. Ears:  Normal auditory acuity. Neck:  Supple;  no masses or thyroidomegaly Lungs: Respirations even and unlabored. Lungs clear to auscultation bilaterally.   No wheezes, crackles, or rhonchi.  Heart:  Regular rate and rhythm;  Without murmur, clicks, rubs or gallops Abdomen:  Soft, nondistended, nontender. Normal bowel sounds. No appreciable masses or hepatomegaly.  No rebound or guarding. *  Extremities:  Without edema, cyanosis or clubbing. Neurologic:  Alert and oriented x3;  grossly normal neurologically. Skin:  Intact without significant lesions or rashes. Cervical Nodes:  No significant cervical adenopathy. Psych:  Alert and cooperative. Normal affect.  LAB RESULTS:  Recent Labs  11/12/16 1141  WBC 6.1  HGB 8.6*  HCT 28.3*  PLT 116*   BMET  Recent Labs  11/12/16 1141  NA 141  K 5.1  CL 100*  CO2 33*  GLUCOSE 178*  BUN 11  CREATININE 0.91  CALCIUM 8.5*   LFT  Recent Labs  11/12/16 1141  PROT 7.8  ALBUMIN 3.5  AST 43*  ALT 31  ALKPHOS 127*  BILITOT 0.5   PT/INR No results for input(s): LABPROT, INR in the last 72 hours.  STUDIES: No results found.    Impression / Plan:   Howard [name] is a 36 y.o. y/o male with decompensated alcohol liver disease with cirrhosis , prior history of esophageal variceal bleeding and banding. Last banded 3 weeks back . Here today with a history of hematemesis (history not very  convincing of a variceal bleed) . Hb is at baseline. Non occlusive portal venous thrombosis seen on last CT scan a month back   Plan   1. NPO 2. IV PPI + IV octreotide + IV antibiotics for SBP prophylaxsis 3. EGD tomorrow morning since he is actually due to evaluation of his varices that were banded  4. OP evaluation for TIPS due to recurrent bleeding .  5. Monitor CBC and transfuse as needed  6. Portal vein thrombosis- not a candidate for anticoagulation, serial abdominal exam to watch for mesenteric ischemia 7. Microcytic anemia- check iron studies and if low needs IV iron.  8. IV vitamins and watch for withdrawal 9. Strongly advised to quit drinking   Thank you for involving me in the care of this patient.      LOS: 0 days   Wyline MoodKiran Celsa Nordahl, MD  11/12/2016, 4:42 PM

## 2016-11-12 NOTE — H&P (Signed)
[employer] - [place] at [facility] ([facility])   PATIENT NAME: Howard [name]    MR#:  [id number]  DATE OF BIRTH:  Jul 07, 1981  DATE OF ADMISSION:  11/12/2016  PRIMARY CARE PHYSICIAN: Marisue Ivan, MD   REQUESTING/REFERRING PHYSICIAN: Phineas Semen mD  CHIEF COMPLAINT:   Chief Complaint  Patient presents with  . Nausea    HISTORY OF PRESENT ILLNESS: Royal Vandevoort  is a 36 y.o. male with a known history of Liver cirrhosis due to alcohol abuse , , COPD, depression, diabetes type 2, esophageal varices which were recently banded as well as sleep apnea who is noncompliant with this BiPAP machine presents with complaint of having nausea. He also has been having dark tarry stools as well as bright red emesis. According to mom patient also has been acting little confused. His ammonia level is noted to be very high. He has a history of noncompliance with lactulose.    PAST MEDICAL HISTORY:   Past Medical History:  Diagnosis Date  . Alcoholic cirrhosis of liver with ascites (HCC)   . Anemia   . COPD (chronic obstructive pulmonary disease) (HCC)   . Depression   . Diabetes (HCC)   . Diabetes mellitus, type II (HCC)   . Esophageal varices (HCC)   . Heart disease   . Hyperlipemia   . Hypertension   . Liver disease   . Multiple thyroid nodules   . Portal hypertensive gastropathy   . Schizophrenia (HCC)     PAST SURGICAL HISTORY: Past Surgical History:  Procedure Laterality Date  . ESOPHAGOGASTRODUODENOSCOPY N/A 10/05/2015   Procedure: ESOPHAGOGASTRODUODENOSCOPY (EGD);  Surgeon: Elnita Maxwell, MD;  Location: [facility] ENDOSCOPY;  Service: Endoscopy;  Laterality: N/A;  . ESOPHAGOGASTRODUODENOSCOPY (EGD) WITH PROPOFOL N/A 08/03/2015   Procedure: ESOPHAGOGASTRODUODENOSCOPY (EGD) WITH PROPOFOL;  Surgeon: Elnita Maxwell, MD;  Location: [facility] ENDOSCOPY;  Service: Endoscopy;  Laterality: N/A;  . ESOPHAGOGASTRODUODENOSCOPY (EGD) WITH PROPOFOL N/A 08/31/2015   Procedure:  ESOPHAGOGASTRODUODENOSCOPY (EGD) WITH PROPOFOL;  Surgeon: Elnita Maxwell, MD;  Location: [facility] ENDOSCOPY;  Service: Endoscopy;  Laterality: N/A;  . ESOPHAGOGASTRODUODENOSCOPY (EGD) WITH PROPOFOL N/A 04/04/2016   Procedure: ESOPHAGOGASTRODUODENOSCOPY (EGD) WITH PROPOFOL;  Surgeon: Scot Jun, MD;  Location: [facility] ENDOSCOPY;  Service: Endoscopy;  Laterality: N/A;  . NO PAST SURGERIES      SOCIAL HISTORY:  Social History  Substance Use Topics  . Smoking status: Never Smoker  . Smokeless tobacco: Never Used  . Alcohol use 30.0 oz/week    50 Cans of beer per week     Comment: last drink monday 10/13/16    FAMILY HISTORY:  Family History  Problem Relation Age of Onset  . Heart disease Mother   . Hypertension Mother   . Hyperlipidemia Mother   . Stroke Father   . Heart attack Father   . Hypertension Father   . Heart disease Father   . Alcohol abuse Father   . Heart disease Brother     DRUG ALLERGIES: No Known Allergies  REVIEW OF SYSTEMS:   CONSTITUTIONAL: No fever, fatigue orPositive weakness.  EYES: No blurred or double vision.  EARS, NOSE, AND THROAT: No tinnitus or ear pain.  RESPIRATORY: No cough, shortness of breath, wheezing or hemoptysis.  CARDIOVASCULAR: No chest pain, orthopnea, edema.  GASTROINTESTINAL: Positive nausea, vomiting, diarrhea or abdominal pain.  GENITOURINARY: No dysuria, hematuria.  ENDOCRINE: No polyuria, nocturia,  HEMATOLOGY: No anemia, easy bruising or bleeding SKIN: No rash or lesion. MUSCULOSKELETAL: No joint pain or arthritis.   NEUROLOGIC:  No tingling, numbness, weakness.  PSYCHIATRY: No anxiety or depression.   MEDICATIONS AT HOME:  Prior to Admission medications   Medication Sig Start Date End Date Taking? Authorizing Provider  acamprosate (CAMPRAL) 333 MG tablet Take 2 tablets (666 mg total) by mouth 3 (three) times daily with meals. 08/19/16   Audery Amel, MD  albuterol (PROVENTIL HFA;VENTOLIN HFA) 108 (90 Base) MCG/ACT  inhaler Inhale 2 puffs into the lungs every 6 (six) hours as needed for wheezing or shortness of breath. 10/08/16   Shaune Pollack, MD  citalopram (CELEXA) 20 MG tablet Take 1 tablet (20 mg total) by mouth every morning. 08/19/16   Audery Amel, MD  furosemide (LASIX) 20 MG tablet Take 1 tablet (20 mg total) by mouth daily. 05/15/16   Alford Highland, MD  lactulose (CHRONULAC) 10 GM/15ML solution Take 45 mLs (30 g total) by mouth every 4 (four) hours. 07/09/16   Katharina Caper, MD  levETIRAcetam (KEPPRA) 1000 MG tablet Take 1 tablet (1,000 mg total) by mouth 2 (two) times daily. 07/09/16   Katharina Caper, MD  losartan (COZAAR) 25 MG tablet Take 25 mg by mouth daily.    Historical Provider, MD  metFORMIN (GLUCOPHAGE) 500 MG tablet Take 1 tablet (500 mg total) by mouth 2 (two) times daily with a meal. 05/15/16   Alford Highland, MD  modafinil (PROVIGIL) 100 MG tablet Take 1 tablet (100 mg total) by mouth daily. 08/13/16   Auburn Bilberry, MD  Multiple Vitamin (MULTIVITAMIN WITH MINERALS) TABS tablet Take 1 tablet by mouth daily. 08/04/15   Ramonita Lab, MD  nadolol (CORGARD) 20 MG tablet Take 20 mg by mouth daily.    Historical Provider, MD  OLANZapine (ZYPREXA) 15 MG tablet Take 2 tablets (30 mg total) by mouth at bedtime. 05/22/16   Audery Amel, MD  ondansetron (ZOFRAN ODT) 4 MG disintegrating tablet Take 1 tablet (4 mg total) by mouth every 8 (eight) hours as needed for nausea or vomiting. 11/03/16   Minna Antis, MD  ondansetron (ZOFRAN) 4 MG tablet Take 1 tablet (4 mg total) by mouth every 8 (eight) hours as needed for nausea or vomiting. 08/26/16   Governor Rooks, MD  pantoprazole (PROTONIX) 40 MG tablet Take 1 tablet (40 mg total) by mouth 2 (two) times daily before a meal. 10/08/16   Shaune Pollack, MD  rifaximin (XIFAXAN) 550 MG TABS tablet Take 1 tablet (550 mg total) by mouth 2 (two) times daily. 07/09/16   Katharina Caper, MD  spironolactone (ALDACTONE) 50 MG tablet Take 1 tablet (50 mg total) by mouth 2  (two) times daily. 07/09/16   Katharina Caper, MD  sucralfate (CARAFATE) 1 g tablet Take 1 g by mouth 4 (four) times daily.    Historical Provider, MD  traZODone (DESYREL) 100 MG tablet Take 1 tablet (100 mg total) by mouth at bedtime as needed for sleep. 10/07/16   Audery Amel, MD      PHYSICAL EXAMINATION:   VITAL SIGNS: Blood pressure (!) 156/80, pulse 88, temperature 98.6 F (37 C), temperature source Oral, resp. rate (!) 22, weight (!) 360 lb (163.3 kg), SpO2 96 %.  GENERAL:  36 y.o.-year-old patient lying in the bed with no acute distress.  EYES: Pupils equal, round, reactive to light and accommodation. No scleral icterus. Extraocular muscles intact.  HEENT: Head atraumatic, normocephalic. Oropharynx and nasopharynx clear.  NECK:  Supple, no jugular venous distention. No thyroid enlargement, no tenderness.  LUNGS: Normal breath sounds bilaterally, no wheezing, rales,rhonchi or crepitation.  No use of accessory muscles of respiration.  CARDIOVASCULAR: S1, S2 normal. No murmurs, rubs, or gallops.  ABDOMEN: Soft, nontender, nondistended. Bowel sounds present. No organomegaly or mass.  EXTREMITIES: No pedal edema, cyanosis, or clubbing.  NEUROLOGIC: Cranial nerves II through XII are intact. Muscle strength 5/5 in all extremities. Sensation intact. Gait not checked.  PSYCHIATRIC: The patient is alert and oriented x 3.  SKIN: No obvious rash, lesion, or ulcer.   LABORATORY PANEL:   CBC  Recent Labs Lab 11/12/16 1141  WBC 6.1  HGB 8.6*  HCT 28.3*  PLT 116*  MCV 76.6*  MCH 23.3*  MCHC 30.4*  RDW 18.1*   ------------------------------------------------------------------------------------------------------------------  Chemistries   Recent Labs Lab 11/12/16 1141  NA 141  K 5.1  CL 100*  CO2 33*  GLUCOSE 178*  BUN 11  CREATININE 0.91  CALCIUM 8.5*  AST 43*  ALT 31  ALKPHOS 127*  BILITOT 0.5    ------------------------------------------------------------------------------------------------------------------ estimated creatinine clearance is 182.4 mL/min (by C-G formula based on SCr of 0.91 mg/dL). ------------------------------------------------------------------------------------------------------------------ No results for input(s): TSH, T4TOTAL, T3FREE, THYROIDAB in the last 72 hours.  Invalid input(s): FREET3   Coagulation profile No results for input(s): INR, PROTIME in the last 168 hours. ------------------------------------------------------------------------------------------------------------------- No results for input(s): DDIMER in the last 72 hours. -------------------------------------------------------------------------------------------------------------------  Cardiac Enzymes No results for input(s): CKMB, TROPONINI, MYOGLOBIN in the last 168 hours.  Invalid input(s): CK ------------------------------------------------------------------------------------------------------------------ Invalid input(s): POCBNP  ---------------------------------------------------------------------------------------------------------------  Urinalysis    Component Value Date/Time   COLORURINE YELLOW (A) 11/12/2016 1141   APPEARANCEUR CLEAR (A) 11/12/2016 1141   APPEARANCEUR Clear 12/25/2014 1404   LABSPEC 1.012 11/12/2016 1141   LABSPEC 1.016 12/25/2014 1404   PHURINE 7.0 11/12/2016 1141   GLUCOSEU NEGATIVE 11/12/2016 1141   GLUCOSEU Negative 12/25/2014 1404   HGBUR LARGE (A) 11/12/2016 1141   BILIRUBINUR NEGATIVE 11/12/2016 1141   BILIRUBINUR Negative 12/25/2014 1404   KETONESUR NEGATIVE 11/12/2016 1141   PROTEINUR NEGATIVE 11/12/2016 1141   NITRITE NEGATIVE 11/12/2016 1141   LEUKOCYTESUR NEGATIVE 11/12/2016 1141   LEUKOCYTESUR Negative 12/25/2014 1404     RADIOLOGY: No results found.  EKG: Orders placed or performed during the hospital encounter of 10/15/16   . ED EKG  . ED EKG  . EKG 12-Lead  . EKG 12-Lead    IMPRESSION AND PLAN: Patient's 36 year old with history of liver cirrhosis and hepatic encephalopathy  1. Hepatic encephalopathy due to noncompliance to medications I will continue his lactulose 30 mg 4 times a day follow-up ammonia level in the morning  2. Liver cirrhosis with complaint of dark tarry stools and hematemesis recent diagnosis of hepatic varices which were banded Hemoglobin is stable continue atenolol I will as GI to see why Dr. Primitivo Gauze  3. Diabetes type 2 Continue metformin and place on sliding scale insulin  4. Sleep apnea with COPD supposed to be on BiPAP at home noncompliant output him back on BiPAP  5. Alcohol abuse monitor for any signs of withdrawal  6. Depression and schizophrenia continue his home regimen  7. SCDs for DVT prophylaxis    All the records are reviewed and case discussed with ED provider. Management plans discussed with the patient, family and they are in agreement.  CODE STATUS: Code Status History    Date Active Date Inactive Code Status Order ID Comments User Context   10/04/2016  6:04 PM 10/08/2016  3:44 PM Full Code [id number]  Shaune Pollack, MD Inpatient   09/22/2016  2:29 AM 09/23/2016  6:38 PM  Full Code [id number]  Oralia Manisavid Willis, MD Inpatient   08/07/2016  7:37 PM 08/12/2016  8:51 PM Full Code [id number]  Gracelyn NurseJohn D Johnston, MD Inpatient   07/07/2016  1:40 AM 07/09/2016  6:29 PM Full Code [id number]  Ihor AustinPavan Pyreddy, MD Inpatient   06/10/2016  6:21 AM 06/11/2016  8:25 PM Full Code [id number]  Ihor AustinPavan Pyreddy, MD Inpatient   05/12/2016  3:55 AM 05/15/2016  2:57 PM Full Code [id number]  Ihor AustinPavan Pyreddy, MD Inpatient   04/28/2016  2:54 AM 04/29/2016  6:23 PM Full Code [id number]  Tonye RoyaltyAlexis Hugelmeyer, DO Inpatient   03/14/2016 12:30 PM 03/16/2016  3:41 PM Full Code [id number]  Shaune PollackQing Chen, MD Inpatient   03/03/2016 12:13 PM 03/06/2016  2:26 PM Full Code [id number]  Milagros LollSrikar Sudini, MD ED   02/10/2016  5:54 PM 02/12/2016  7:34 PM  Full Code [id number]  Ramonita LabAruna Gouru, MD Inpatient   11/11/2015  5:16 AM 11/13/2015  6:00 PM Full Code [id number]  Ihor AustinPavan Pyreddy, MD Inpatient   11/07/2015 10:02 PM 11/09/2015  4:56 PM Full Code [id number]  Enid Baasadhika Kalisetti, MD Inpatient   10/28/2015  8:14 PM 10/29/2015  2:26 PM Full Code [id number]  Altamese DillingVaibhavkumar Vachhani, MD Inpatient   10/16/2015 11:47 PM 10/17/2015  6:00 PM Full Code [id number]  Oralia Manisavid Willis, MD Inpatient   07/30/2015  5:35 PM 08/04/2015  8:34 PM Full Code [id number]  Enedina FinnerSona Shaterica Mcclatchy, MD Inpatient   07/05/2015  1:16 AM 07/08/2015  7:22 PM Full Code [id number]  Oralia Manisavid Willis, MD Inpatient   05/30/2015  7:28 PM 06/01/2015  3:36 PM Full Code [id number]  Audery AmelJohn T [name], MD Inpatient       TOTAL TIME TAKING CARE OF THIS PATIENT: 55minutes.    Auburn BilberryPATEL, Kemani Demarais M.D on 11/12/2016 at 1:47 PM  Between 7am to 6pm - Pager - [phone]  After 6pm go to [email or web address] - password [email or web address] [email or web address]  [facility]  Office  [phone]  CC: Primary care physician; Marisue IvanLINTHAVONG, [name], MD

## 2016-11-12 NOTE — Progress Notes (Signed)
Verified that patient could have sips w/ meds while NPO with both Dr. Allena KatzPatel and Dr. Sharlet SalinaKiran. Patient aware that he is NPO and medications giving accordingly with sips of water. Dr. Allena KatzPatel paged with blood sugar results and IV fluids started due to NPO status, re-check of BS 73- Metformin Dc'ed by MD. Patient to have EGD in the morning. Additional IV access obtained and IV fluids in addition started. Thorough report given to Night RN, R. [place]az[place], RN.

## 2016-11-12 NOTE — ED Provider Notes (Signed)
[facility] Emergency Department Provider Note  ____________________________________________   I have reviewed the triage vital signs and the nursing notes.   HISTORY  Chief Complaint Nausea   History limited by: Not Limited   HPI Howard [name] is a 36 y.o. male who presents to the emergency department today because of concerns for nausea, that his ammonia by being high and some dark stool. Patient states that the nausea got worse today. He was seen in the emergency department couple days ago for nausea. That time he was prescribed Zofran however the patient states he did not take any this morning because he forgot. He also thinks that his ammonia might be high. He is only taking 1 dose of his lactulose a day. He states he is having to maybe 3 bowel movements a day. He has had some dark black diarrhea. Patient denies any fevers. States he had some abdominal pain yesterday.   Past Medical History:  Diagnosis Date  . Alcoholic cirrhosis of liver with ascites (HCC)   . Anemia   . COPD (chronic obstructive pulmonary disease) (HCC)   . Depression   . Diabetes (HCC)   . Diabetes mellitus, type II (HCC)   . Esophageal varices (HCC)   . Heart disease   . Hyperlipemia   . Hypertension   . Liver disease   . Multiple thyroid nodules   . Portal hypertensive gastropathy   . Schizophrenia [facility])     Patient Active Problem List   Diagnosis Date Noted  . GIB (gastrointestinal bleeding) 10/05/2016  . Ascites 10/04/2016  . DNR (do not resuscitate) discussion 08/11/2016  . Palliative care by specialist 08/11/2016  . Muscle weakness (generalized)   . Acute upper GI bleed   . GI bleed 08/07/2016  . Alcoholic cirrhosis of liver (HCC) 07/09/2016  . Acute respiratory failure with hypercapnia (HCC) 07/09/2016  . Hyperglycemia 07/09/2016  . Seizure (HCC) 06/10/2016  . Altered mental status 05/12/2016  . Headache 04/29/2016  . OSA (obstructive sleep apnea) 04/29/2016   . Anemia 04/29/2016  . Thrombocytopenia (HCC) 04/29/2016  . Coagulopathy (HCC) 04/29/2016  . Obesity 04/29/2016  . Generalized weakness 04/29/2016  . Acute hepatic encephalopathy 03/14/2016  . Controlled type 2 diabetes mellitus without complication (HCC) 01/15/2016  . Normocytic anemia 12/12/2015  . Essential (primary) hypertension 12/11/2015  . Pure hypercholesterolemia 12/11/2015  . Fever 11/11/2015  . Ascites due to alcoholic cirrhosis (HCC) 11/11/2015  . Cirrhosis of liver with ascites (HCC) 11/11/2015  . Hepatic encephalopathy (HCC) 10/16/2015  . Type 2 diabetes mellitus (HCC) 10/16/2015  . Acute renal failure (ARF) (HCC) 07/30/2015  . 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 Alcoholic cirrhosis (HCC) 07/19/2015  . Hypoxia 07/04/2015  . Elevated transaminase level 07/04/2015  . DOE (dyspnea on exertion) 07/04/2015  . Alcohol abuse 05/31/2015  . Hypertension 05/29/2015  . Paranoid schizophrenia (HCC) 03/20/2015    Past Surgical History:  Procedure Laterality Date  . ESOPHAGOGASTRODUODENOSCOPY N/A 10/05/2015   Procedure: ESOPHAGOGASTRODUODENOSCOPY (EGD);  Surgeon: Elnita Maxwell, MD;  Location: [facility] ENDOSCOPY;  Service: Endoscopy;  Laterality: N/A;  . ESOPHAGOGASTRODUODENOSCOPY (EGD) WITH PROPOFOL N/A 08/03/2015   Procedure: ESOPHAGOGASTRODUODENOSCOPY (EGD) WITH PROPOFOL;  Surgeon: Elnita Maxwell, MD;  Location: [facility] ENDOSCOPY;  Service: Endoscopy;  Laterality: N/A;  . ESOPHAGOGASTRODUODENOSCOPY (EGD) WITH PROPOFOL N/A 08/31/2015   Procedure: ESOPHAGOGASTRODUODENOSCOPY (EGD) WITH PROPOFOL;  Surgeon: Elnita Maxwell, MD;  Location: [facility] ENDOSCOPY;  Service: Endoscopy;  Laterality: N/A;  . ESOPHAGOGASTRODUODENOSCOPY (EGD) WITH PROPOFOL  N/A 04/04/2016   Procedure: ESOPHAGOGASTRODUODENOSCOPY (EGD) WITH PROPOFOL;  Surgeon: Scot Junobert T Elliott, MD;  [facility];  Service: Endoscopy;  Laterality: N/A;  . NO PAST SURGERIES      Prior to  Admission medications   Medication Sig Start Date End Date Taking? Authorizing Provider  acamprosate (CAMPRAL) 333 MG tablet Take 2 tablets (666 mg total) by mouth 3 (three) times daily with meals. 08/19/16   Audery AmelJohn T [name], MD  albuterol (PROVENTIL HFA;VENTOLIN HFA) 108 (90 Base) MCG/ACT inhaler Inhale 2 puffs into the lungs every 6 (six) hours as needed for wheezing or shortness of breath. 10/08/16   Shaune PollackQing Chen, MD  citalopram (CELEXA) 20 MG tablet Take 1 tablet (20 mg total) by mouth every morning. 08/19/16   Audery AmelJohn T [name], MD  furosemide (LASIX) 20 MG tablet Take 1 tablet (20 mg total) by mouth daily. 05/15/16   Alford Highlandichard Wieting, MD  lactulose (CHRONULAC) 10 GM/15ML solution Take 45 mLs (30 g total) by mouth every 4 (four) hours. 07/09/16   Katharina Caperima Vaickute, MD  levETIRAcetam (KEPPRA) 1000 MG tablet Take 1 tablet (1,000 mg total) by mouth 2 (two) times daily. 07/09/16   Katharina Caperima Vaickute, MD  losartan (COZAAR) 25 MG tablet Take 25 mg by mouth daily.    Historical Provider, MD  metFORMIN (GLUCOPHAGE) 500 MG tablet Take 1 tablet (500 mg total) by mouth 2 (two) times daily with a meal. 05/15/16   Alford Highlandichard Wieting, MD  modafinil (PROVIGIL) 100 MG tablet Take 1 tablet (100 mg total) by mouth daily. 08/13/16   Auburn BilberryShreyang Patel, MD  Multiple Vitamin (MULTIVITAMIN WITH MINERALS) TABS tablet Take 1 tablet by mouth daily. 08/04/15   Ramonita LabAruna Gouru, MD  nadolol (CORGARD) 20 MG tablet Take 20 mg by mouth daily.    Historical Provider, MD  OLANZapine (ZYPREXA) 15 MG tablet Take 2 tablets (30 mg total) by mouth at bedtime. 05/22/16   Audery AmelJohn T [name], MD  ondansetron (ZOFRAN ODT) 4 MG disintegrating tablet Take 1 tablet (4 mg total) by mouth every 8 (eight) hours as needed for nausea or vomiting. 11/03/16   Minna AntisKevin Paduchowski, MD  ondansetron (ZOFRAN) 4 MG tablet Take 1 tablet (4 mg total) by mouth every 8 (eight) hours as needed for nausea or vomiting. 08/26/16   Governor Rooksebecca Lord, MD  pantoprazole (PROTONIX) 40 MG tablet Take 1 tablet  (40 mg total) by mouth 2 (two) times daily before a meal. 10/08/16   Shaune PollackQing Chen, MD  rifaximin (XIFAXAN) 550 MG TABS tablet Take 1 tablet (550 mg total) by mouth 2 (two) times daily. 07/09/16   Katharina Caperima Vaickute, MD  spironolactone (ALDACTONE) 50 MG tablet Take 1 tablet (50 mg total) by mouth 2 (two) times daily. 07/09/16   Katharina Caperima Vaickute, MD  sucralfate (CARAFATE) 1 g tablet Take 1 g by mouth 4 (four) times daily.    Historical Provider, MD  traZODone (DESYREL) 100 MG tablet Take 1 tablet (100 mg total) by mouth at bedtime as needed for sleep. 10/07/16   Audery AmelJohn T [name], MD    Allergies Patient has no known allergies.  Family History  Problem Relation Age of Onset  . Heart disease Mother   . Hypertension Mother   . Hyperlipidemia Mother   . Stroke Father   . Heart attack Father   . Hypertension Father   . Heart disease Father   . Alcohol abuse Father   . Heart disease Brother     Social History Social History  Substance Use Topics  . Smoking  status: Never Smoker  . Smokeless tobacco: Never Used  . Alcohol use 30.0 oz/week    50 Cans of beer per week     Comment: last drink monday 10/13/16    Review of Systems  Constitutional: Negative for fever. Cardiovascular: Negative for chest pain. Respiratory: Negative for shortness of breath. Gastrointestinal: Positive for abdominal pain yesterday, positive for nausea. Black stool. Genitourinary: Negative for dysuria. Musculoskeletal: Negative for back pain. Skin: Negative for rash. Neurological: Negative for headaches, focal weakness or numbness.  10-point ROS otherwise negative.  ____________________________________________   PHYSICAL EXAM:  VITAL SIGNS: ED Triage Vitals  Enc Vitals Group     BP 11/12/16 1141 (!) 156/80     Pulse Rate 11/12/16 1141 89     Resp 11/12/16 1141 18     Temp 11/12/16 1141 98.6 F (37 C)     Temp Source 11/12/16 1141 Oral     SpO2 11/12/16 1141 92 %     Weight 11/12/16 1143 (!) 360 lb (163.3 kg)      Height --     Constitutional: Alert and oriented. Well appearing and in no distress. Eyes: Conjunctivae are normal. Normal extraocular movements. ENT   Head: Normocephalic and atraumatic.   Nose: No congestion/rhinnorhea.   Mouth/Throat: Mucous membranes are moist.   Neck: No stridor. Hematological/Lymphatic/Immunilogical: No cervical lymphadenopathy. Cardiovascular: Normal rate, regular rhythm.  No murmurs, rubs, or gallops.  Respiratory: Normal respiratory effort without tachypnea nor retractions. Breath sounds are clear and equal bilaterally. No wheezes/rales/rhonchi. Gastrointestinal: Soft and non tender. No rebound. No guarding.  Genitourinary: Deferred Musculoskeletal: Normal range of motion in all extremities. No lower extremity edema. Neurologic:  Normal speech and language. No gross focal neurologic deficits are appreciated.  Skin:  Skin is warm, dry and intact. No rash noted. Psychiatric: Mood and affect are normal. Speech and behavior are normal. Patient exhibits appropriate insight and judgment.  ____________________________________________    LABS (pertinent positives/negatives)  Labs Reviewed  COMPREHENSIVE METABOLIC PANEL - Abnormal; Notable for the following:       Result Value   Chloride 100 (*)    CO2 33 (*)    Glucose, Bld 178 (*)    Calcium 8.5 (*)    AST 43 (*)    Alkaline Phosphatase 127 (*)    All other components within normal limits  CBC - Abnormal; Notable for the following:    RBC 3.69 (*)    Hemoglobin 8.6 (*)    HCT 28.3 (*)    MCV 76.6 (*)    MCH 23.3 (*)    MCHC 30.4 (*)    RDW 18.1 (*)    Platelets 116 (*)    All other components within normal limits  URINALYSIS, COMPLETE (UACMP) WITH MICROSCOPIC - Abnormal; Notable for the following:    Color, Urine YELLOW (*)    APPearance CLEAR (*)    Hgb urine dipstick LARGE (*)    Squamous Epithelial / LPF 0-5 (*)    All other components within normal limits  AMMONIA - Abnormal;  Notable for the following:    Ammonia 134 (*)    All other components within normal limits  LIPASE, BLOOD     ____________________________________________   EKG  None  ____________________________________________    RADIOLOGY  None  ____________________________________________   PROCEDURES  Procedures  ____________________________________________   INITIAL IMPRESSION / ASSESSMENT AND PLAN / ED COURSE  Pertinent labs & imaging results that were available during my care of the patient were reviewed by  me and considered in my medical decision making (see chart for details).  Patient presented to the emergency department today because of concerns for nausea and that his ammonia level might be high. He does have a history of cirrhosis and hepatic encephalopathy. Ammonia elevated today. Patient will be given dose of lactulose in the emergency department and will be admitted to the hospitalist service.   ____________________________________________   FINAL CLINICAL IMPRESSION(S) / ED DIAGNOSES  Final diagnoses:  Hepatic encephalopathy (HCC)     Note: This dictation was prepared with Dragon dictation. Any transcriptional errors that result from this process are unintentional     Phineas Semen, MD 11/12/16 1426

## 2016-11-12 NOTE — ED Notes (Signed)
Pt given a cup of ice and pt also complaining of chest pain. Nurse notified and 12 lead EKG done.

## 2016-11-12 NOTE — ED Triage Notes (Signed)
Pt with nausea for two days.

## 2016-11-12 NOTE — Progress Notes (Signed)
Pt declined bipap

## 2016-11-12 NOTE — ED Notes (Signed)
Admit Provider at bedside. 

## 2016-11-13 ENCOUNTER — Encounter
Admission: EM | Disposition: A | Discharge status: Home / Self Care | Payer: Self-pay | Source: Home / Self Care | Attending: Internal Medicine

## 2016-11-13 ENCOUNTER — Inpatient Hospital Stay: Payer: Medicare Other | Admitting: Anesthesiology

## 2016-11-13 ENCOUNTER — Inpatient Hospital Stay: Payer: Medicare Other

## 2016-11-13 HISTORY — PX: ESOPHAGOGASTRODUODENOSCOPY (EGD) WITH PROPOFOL: SHX5813

## 2016-11-13 LAB — CBC
HCT: 28 % — ABNORMAL LOW (ref 40.0–52.0)
Hemoglobin: 8.3 g/dL — ABNORMAL LOW (ref 13.0–18.0)
MCH: 22.8 pg — ABNORMAL LOW (ref 26.0–34.0)
MCHC: 29.5 g/dL — ABNORMAL LOW (ref 32.0–36.0)
MCV: 77.4 fL — AB (ref 80.0–100.0)
PLATELETS: 93 10*3/uL — AB (ref 150–440)
RBC: 3.62 MIL/uL — AB (ref 4.40–5.90)
RDW: 18.8 % — AB (ref 11.5–14.5)
WBC: 5.6 10*3/uL (ref 3.8–10.6)

## 2016-11-13 LAB — BASIC METABOLIC PANEL
Anion gap: 4 — ABNORMAL LOW (ref 5–15)
BUN: 10 mg/dL (ref 6–20)
CALCIUM: 8.4 mg/dL — AB (ref 8.9–10.3)
CO2: 36 mmol/L — ABNORMAL HIGH (ref 22–32)
Chloride: 101 mmol/L (ref 101–111)
Creatinine, Ser: 0.94 mg/dL (ref 0.61–1.24)
Glucose, Bld: 143 mg/dL — ABNORMAL HIGH (ref 65–99)
POTASSIUM: 4.7 mmol/L (ref 3.5–5.1)
SODIUM: 141 mmol/L (ref 135–145)

## 2016-11-13 LAB — GLUCOSE, CAPILLARY
GLUCOSE-CAPILLARY: 153 mg/dL — AB (ref 65–99)
GLUCOSE-CAPILLARY: 124 mg/dL — AB (ref 65–99)
Glucose-Capillary: 146 mg/dL — ABNORMAL HIGH (ref 65–99)
Glucose-Capillary: 128 mg/dL — ABNORMAL HIGH (ref 65–99)

## 2016-11-13 LAB — AMMONIA: AMMONIA: 100 umol/L — AB (ref 9–35)

## 2016-11-13 SURGERY — ESOPHAGOGASTRODUODENOSCOPY (EGD) WITH PROPOFOL
Anesthesia: General

## 2016-11-13 MED ORDER — PANTOPRAZOLE SODIUM 40 MG PO TBEC
40.0000 mg | DELAYED_RELEASE_TABLET | Freq: Every day | ORAL | Status: DC
  Administered 2016-11-13 – 2016-11-14 (×2): 40 mg via ORAL
  Filled 2016-11-13 (×2): qty 1

## 2016-11-13 MED ORDER — LIDOCAINE HCL (CARDIAC) 20 MG/ML IV SOLN
INTRAVENOUS | Status: DC | PRN
  Administered 2016-11-13: 100 mg via INTRAVENOUS

## 2016-11-13 MED ORDER — LIDOCAINE HCL (PF) 2 % IJ SOLN
INTRAMUSCULAR | Status: AC
  Filled 2016-11-13: qty 2

## 2016-11-13 MED ORDER — PROPOFOL 10 MG/ML IV BOLUS
INTRAVENOUS | Status: DC | PRN
  Administered 2016-11-13: 10 mg via INTRAVENOUS
  Administered 2016-11-13: 90 mg via INTRAVENOUS

## 2016-11-13 MED ORDER — SODIUM CHLORIDE 0.9 % IV SOLN
510.0000 mg | Freq: Once | INTRAVENOUS | Status: AC
  Administered 2016-11-13: 15:00:00 510 mg via INTRAVENOUS
  Filled 2016-11-13: qty 17

## 2016-11-13 MED ORDER — PROPOFOL 10 MG/ML IV BOLUS
INTRAVENOUS | Status: AC
  Filled 2016-11-13: qty 20

## 2016-11-13 MED ORDER — OXYCODONE HCL 5 MG PO TABS
5.0000 mg | ORAL_TABLET | ORAL | Status: DC | PRN
  Administered 2016-11-13 (×2): 5 mg via ORAL
  Filled 2016-11-13 (×2): qty 1

## 2016-11-13 MED ORDER — GLYCOPYRROLATE 0.2 MG/ML IJ SOLN
INTRAMUSCULAR | Status: AC
Start: 2016-11-13 — End: 2016-11-13
  Filled 2016-11-13: qty 1

## 2016-11-13 MED ORDER — PROPOFOL 500 MG/50ML IV EMUL
INTRAVENOUS | Status: DC | PRN
  Administered 2016-11-13: 150 ug/kg/min via INTRAVENOUS

## 2016-11-13 MED ORDER — SENNOSIDES-DOCUSATE SODIUM 8.6-50 MG PO TABS
2.0000 | ORAL_TABLET | Freq: Every day | ORAL | Status: DC
  Administered 2016-11-13: 21:00:00 2 via ORAL
  Filled 2016-11-13: qty 2

## 2016-11-13 MED ORDER — SODIUM CHLORIDE 0.9 % IV SOLN: INTRAVENOUS | Status: DC

## 2016-11-13 MED ORDER — CIPROFLOXACIN HCL 500 MG PO TABS
250.0000 mg | ORAL_TABLET | Freq: Two times a day (BID) | ORAL | Status: DC
  Administered 2016-11-13 – 2016-11-14 (×2): 250 mg via ORAL
  Filled 2016-11-13 (×2): qty 1

## 2016-11-13 MED ORDER — GLYCOPYRROLATE 0.2 MG/ML IJ SOLN
INTRAMUSCULAR | Status: DC | PRN
  Administered 2016-11-13: 0.2 mg via INTRAVENOUS

## 2016-11-13 NOTE — Transfer of Care (Signed)
Immediate Anesthesia Transfer of Care Note  Patient: Howard [name]  Procedure(s) Performed: Procedure(s): ESOPHAGOGASTRODUODENOSCOPY (EGD) WITH PROPOFOL (N/A)  Patient Location: PACU  Anesthesia Type:General  Level of Consciousness: sedated and responds to stimulation  Airway & Oxygen Therapy: Patient Spontanous Breathing and Patient connected to face mask oxygen  Post-op Assessment: Report given to RN and Post -op Vital signs reviewed and stable  Post vital signs: Reviewed and stable  Last Vitals:  Vitals:   11/13/16 0947 11/13/16 1106  BP: (!) 156/80 (!) 146/81  Pulse: 72 83  Resp: (!) 22 18  Temp: 36.6 C     Last Pain:  Vitals:   11/13/16 1016  TempSrc:   PainSc: 0-No pain         Complications: No apparent anesthesia complications

## 2016-11-13 NOTE — Anesthesia Procedure Notes (Signed)
Performed by: Glady Ouderkirk Pre-anesthesia Checklist: Patient identified, Emergency Drugs available, Suction available, Patient being monitored and Timeout performed Patient Re-evaluated:Patient Re-evaluated prior to inductionOxygen Delivery Method: Simple face mask Preoxygenation: Pre-oxygenation with 100% oxygen Intubation Type: IV induction       

## 2016-11-13 NOTE — Progress Notes (Signed)
EGD  Food seen in the esophagus and stomach. No blood. No varices seen   Plan   1. D/c octreotide, IV PPI 2. Regular diet  3. Monitor CBC 4. OP evaluation for TIPS due to recurrent bleeding .  5. Microcytic anemia- check iron studies and if low needs IV iron.  6. IV vitamins and watch for withdrawal 7. Strongly advised to quit drinking  8. OP follow up with Dr Markham JordanElliot 9.Can change to to oral ciprofloxacin to complete 7 days of antibiotics.   I will sign off.  Please call me if any further GI concerns or questions.  We would like to thank you for the opportunity to participate in the care of Howard [name].   Dr Wyline MoodKiran Rafaella Kole  Gastroenterology/Hepatology Pager: [phone]

## 2016-11-13 NOTE — Anesthesia Postprocedure Evaluation (Signed)
Anesthesia Post Note  Patient: [name]  Procedure(s) Performed: Procedure(s) (LRB): ESOPHAGOGASTRODUODENOSCOPY (EGD) WITH PROPOFOL (N/A)  Patient location during evaluation: Endoscopy Anesthesia Type: General Level of consciousness: awake and alert and oriented Pain management: pain level controlled Vital Signs Assessment: post-procedure vital signs reviewed and stable Respiratory status: spontaneous breathing, nonlabored ventilation and respiratory function stable Cardiovascular status: blood pressure returned to baseline and stable Postop Assessment: no signs of nausea or vomiting Anesthetic complications: no     Last Vitals:  Vitals:   11/13/16 1110 11/13/16 1120  BP: (!) 146/81 136/85  Pulse: 79 80  Resp: 15 17  Temp:      Last Pain:  Vitals:   11/13/16 1016  TempSrc:   PainSc: 0-No pain                 Cheyane Ayon

## 2016-11-13 NOTE — H&P (Signed)
Wyline MoodKiran Brendin Situ MD [address] [place], [place] [address] Phone: [phone] Fax : [phone]  Primary Care Physician:  Marisue IvanLINTHAVONG, [name], MD Primary Gastroenterologist:  Dr. Wyline MoodKiran Zavian Slowey   Pre-Procedure History & Physical: HPI:  Howard [name] is a 36 y.o. male is here for an endoscopy and possible banding of varices .   Past Medical History:  Diagnosis Date  . Alcoholic cirrhosis of liver with ascites (HCC)   . Anemia   . COPD (chronic obstructive pulmonary disease) (HCC)   . Depression   . Diabetes (HCC)   . Diabetes mellitus, type II (HCC)   . Esophageal varices (HCC)   . Heart disease   . Hyperlipemia   . Hypertension   . Liver disease   . Multiple thyroid nodules   . Portal hypertensive gastropathy   . Schizophrenia [facility](HCC)     Past Surgical History:  Procedure Laterality Date  . ESOPHAGOGASTRODUODENOSCOPY N/A 10/05/2015   Procedure: ESOPHAGOGASTRODUODENOSCOPY (EGD);  Surgeon: Elnita MaxwellMatthew Gordon Rein, MD;  [facility];  Service: Endoscopy;  Laterality: N/A;  . ESOPHAGOGASTRODUODENOSCOPY (EGD) WITH PROPOFOL N/A 08/03/2015   Procedure: ESOPHAGOGASTRODUODENOSCOPY (EGD) WITH PROPOFOL;  Surgeon: Elnita MaxwellMatthew Gordon Rein, MD;  [facility];  Service: Endoscopy;  Laterality: N/A;  . ESOPHAGOGASTRODUODENOSCOPY (EGD) WITH PROPOFOL N/A 08/31/2015   Procedure: ESOPHAGOGASTRODUODENOSCOPY (EGD) WITH PROPOFOL;  Surgeon: Elnita MaxwellMatthew Gordon Rein, MD;  [facility];  Service: Endoscopy;  Laterality: N/A;  . ESOPHAGOGASTRODUODENOSCOPY (EGD) WITH PROPOFOL N/A 04/04/2016   Procedure: ESOPHAGOGASTRODUODENOSCOPY (EGD) WITH PROPOFOL;  Surgeon: Scot Junobert T Elliott, MD;  [facility];  Service: Endoscopy;  Laterality: N/A;  . NO PAST SURGERIES      Prior to Admission medications   Medication Sig Start Date End Date Taking? Authorizing Provider  acamprosate (CAMPRAL) 333 MG tablet Take 2 tablets (666 mg total) by mouth 3 (three) times daily with meals.  08/19/16  Yes Audery AmelJohn T [name], MD  albuterol (PROVENTIL HFA;VENTOLIN HFA) 108 (90 Base) MCG/ACT inhaler Inhale 2 puffs into the lungs every 6 (six) hours as needed for wheezing or shortness of breath. 10/08/16  Yes Shaune PollackQing Chen, MD  citalopram (CELEXA) 20 MG tablet Take 1 tablet (20 mg total) by mouth every morning. 08/19/16  Yes Audery AmelJohn T [name], MD  furosemide (LASIX) 20 MG tablet Take 1 tablet (20 mg total) by mouth daily. 05/15/16  Yes Richard Renae GlossWieting, MD  lactulose (CHRONULAC) 10 GM/15ML solution Take 45 mLs (30 g total) by mouth every 4 (four) hours. 07/09/16  Yes Katharina Caperima Vaickute, MD  levETIRAcetam (KEPPRA) 1000 MG tablet Take 1 tablet (1,000 mg total) by mouth 2 (two) times daily. Patient taking differently: Take 750 mg by mouth 2 (two) times daily.  07/09/16  Yes Katharina Caperima Vaickute, MD  losartan (COZAAR) 25 MG tablet Take 25 mg by mouth daily.   Yes Historical Provider, MD  metFORMIN (GLUCOPHAGE) 500 MG tablet Take 1 tablet (500 mg total) by mouth 2 (two) times daily with a meal. 05/15/16  Yes Alford Highlandichard Wieting, MD  Multiple Vitamin (MULTIVITAMIN WITH MINERALS) TABS tablet Take 1 tablet by mouth daily. 08/04/15  Yes Ramonita LabAruna Gouru, MD  nadolol (CORGARD) 20 MG tablet Take 20 mg by mouth daily.   Yes Historical Provider, MD  OLANZapine (ZYPREXA) 15 MG tablet Take 2 tablets (30 mg total) by mouth at bedtime. 05/22/16  Yes Audery AmelJohn T [name], MD  ondansetron (ZOFRAN) 4 MG tablet Take 1 tablet (4 mg total) by mouth every 8 (eight) hours as needed for nausea or vomiting. 08/26/16  Yes  Governor Rooks, MD  pantoprazole (PROTONIX) 40 MG tablet Take 1 tablet (40 mg total) by mouth 2 (two) times daily before a meal. 10/08/16  Yes Shaune Pollack, MD  rifaximin (XIFAXAN) 550 MG TABS tablet Take 1 tablet (550 mg total) by mouth 2 (two) times daily. 07/09/16  Yes Katharina Caper, MD  spironolactone (ALDACTONE) 50 MG tablet Take 1 tablet (50 mg total) by mouth 2 (two) times daily. 07/09/16  Yes Katharina Caper, MD  sucralfate (CARAFATE) 1 g  tablet Take 1 g by mouth 4 (four) times daily.   Yes Historical Provider, MD  traZODone (DESYREL) 100 MG tablet Take 1 tablet (100 mg total) by mouth at bedtime as needed for sleep. 10/07/16  Yes Audery Amel, MD  modafinil (PROVIGIL) 100 MG tablet Take 1 tablet (100 mg total) by mouth daily. Patient not taking: Reported on 11/12/2016 08/13/16   Auburn Bilberry, MD    Allergies as of 11/12/2016  . (No Known Allergies)    Family History  Problem Relation Age of Onset  . Heart disease Mother   . Hypertension Mother   . Hyperlipidemia Mother   . Stroke Father   . Heart attack Father   . Hypertension Father   . Heart disease Father   . Alcohol abuse Father   . Heart disease Brother     Social History   Social History  . Marital status: Single    Spouse name: N/A  . Number of children: N/A  . Years of education: N/A   Occupational History  . disabled    Social History Main Topics  . Smoking status: Never Smoker  . Smokeless tobacco: Never Used  . Alcohol use 30.0 oz/week    50 Cans of beer per week     Comment: last drink monday 10/13/16  . Drug use: No  . Sexual activity: No   Other Topics Concern  . Not on file   Social History Narrative   ** Merged History Encounter **        Review of Systems: See HPI, otherwise negative ROS  Physical Exam: BP (!) 156/80 (BP Location: Left Arm)   Pulse 72   Temp 97.8 F (36.6 C) (Oral)   Resp (!) 22   Ht 6\' 3"  (1.905 m)   Wt (!) 361 lb 4.8 oz (163.9 kg)   SpO2 94%   BMI 45.16 kg/m  General:   Alert,  pleasant and cooperative in NAD Head:  Normocephalic and atraumatic. Neck:  Supple; no masses or thyromegaly. Lungs:  Clear throughout to auscultation.    Heart:  Regular rate and rhythm. Abdomen:  Soft, nontender and nondistended. Normal bowel sounds, without guarding, and without rebound.   Neurologic:  Alert and  oriented x4;  grossly normal neurologically.  Impression/Plan: Howard [name] is here for an endoscopy  andand possible banding of varices  to be performed for gi bleed   Risks, benefits, limitations, and alternatives regarding  endoscopy and possible banding of varices  have been reviewed with the patient.  Questions have been answered.  All parties agreeable.   Wyline Mood, MD  11/13/2016, 10:34 AM

## 2016-11-13 NOTE — OR Nursing (Signed)
Report to Consecomattie Benson rn

## 2016-11-13 NOTE — Progress Notes (Signed)
[facility] [place] at [facility]   PATIENT NAME: Howard [name]    MR#:  [id number]  DATE OF BIRTH:  07-28-1981  SUBJECTIVE:  CHIEF COMPLAINT:   Chief Complaint  Patient presents with  . Nausea     Came with high ammonia, chronic alcoholic liver cirrhosis, one episode of blood in vomit. Hb stable.   Alert and oriented now.  REVIEW OF SYSTEMS:  CONSTITUTIONAL: No fever, fatigue or weakness.  EYES: No blurred or double vision.  EARS, NOSE, AND THROAT: No tinnitus or ear pain.  RESPIRATORY: No cough, shortness of breath, wheezing or hemoptysis.  CARDIOVASCULAR: No chest pain, orthopnea, edema.  GASTROINTESTINAL: No nausea, vomiting, diarrhea or abdominal pain.  GENITOURINARY: No dysuria, hematuria.  ENDOCRINE: No polyuria, nocturia,  HEMATOLOGY: No anemia, easy bruising or bleeding SKIN: No rash or lesion. MUSCULOSKELETAL: No joint pain or arthritis.   NEUROLOGIC: No tingling, numbness, weakness.  PSYCHIATRY: No anxiety or depression.   ROS  DRUG ALLERGIES:  No Known Allergies  VITALS:  Blood pressure 126/84, pulse 77, temperature 97.8 F (36.6 C), temperature source Oral, resp. rate 14, height 6\' 3"  (1.905 m), weight (!) 163.9 kg (361 lb 4.8 oz), SpO2 (!) 89 %.  PHYSICAL EXAMINATION:  GENERAL:  36 y.o.-year-old patient lying in the bed with no acute distress.  EYES: Pupils equal, round, reactive to light and accommodation. No scleral icterus. Extraocular muscles intact.  HEENT: Head atraumatic, normocephalic. Oropharynx and nasopharynx clear.  NECK:  Supple, no jugular venous distention. No thyroid enlargement, no tenderness.  LUNGS: Normal breath sounds bilaterally, no wheezing, rales,rhonchi or crepitation. No use of accessory muscles of respiration.  CARDIOVASCULAR: S1, S2 normal. No murmurs, rubs, or gallops.  ABDOMEN: Soft, mild tender, distended. Bowel sounds present. No organomegaly or mass.  EXTREMITIES: No pedal edema, cyanosis, or clubbing.   NEUROLOGIC: Cranial nerves II through XII are intact. Muscle strength 5/5 in all extremities. Sensation intact. Gait not checked. Not much tremors. PSYCHIATRIC: The patient is alert and oriented x 3.  SKIN: No obvious rash, lesion, or ulcer.   Physical Exam LABORATORY PANEL:   CBC  Recent Labs Lab 11/13/16 0446  WBC 5.6  HGB 8.3*  HCT 28.0*  PLT 93*   ------------------------------------------------------------------------------------------------------------------  Chemistries   Recent Labs Lab 11/12/16 1141 11/13/16 0446  NA 141 141  K 5.1 4.7  CL 100* 101  CO2 33* 36*  GLUCOSE 178* 143*  BUN 11 10  CREATININE 0.91 0.94  CALCIUM 8.5* 8.4*  AST 43*  --   ALT 31  --   ALKPHOS 127*  --   BILITOT 0.5  --    ------------------------------------------------------------------------------------------------------------------  Cardiac Enzymes No results for input(s): TROPONINI in the last 168 hours. ------------------------------------------------------------------------------------------------------------------  RADIOLOGY:  No results found.  ASSESSMENT AND PLAN:   Active Problems:   Hepatic encephalopathy (HCC)  1. Hepatic encephalopathy due to noncompliance to medications continue his lactulose 30 mg 4 times a day follow-up ammonia level in the morning  came down some.  2. Liver cirrhosis with complaint of dark tarry stools and hematemesis recent diagnosis of hepatic varices which were banded Hemoglobin is stable continue atenolol   Appreciated GI help, EGD done, no active bleed or verices, stopped octreotide, cont PPI.   Started on diet.  Some tenderness on abdomen with distension.  Do ascites tap and on Abx cefuroxime, switched to cipro.  3. Diabetes type 2 Continue metformin and place on sliding scale insulin  4. Sleep apnea with COPD supposed  to be on BiPAP at home noncompliant output him back on BiPAP  5. Alcohol abuse monitor for any signs of  withdrawal  I counceleld in detail about stopping the ETOH  6. Depression and schizophrenia continue his home regimen  7. SCDs for DVT prophylaxis  All the records are reviewed and case discussed with Care Management/Social Workerr. Management plans discussed with the patient, family and they are in agreement.  CODE STATUS: Full.  TOTAL TIME TAKING CARE OF THIS PATIENT: 35 minutes.    POSSIBLE D/C IN 1-2 DAYS, DEPENDING ON CLINICAL CONDITION.   Altamese DillingVACHHANI, Daimion Adamcik M.D on 11/13/2016   Between 7am to 6pm - Pager - [phone]  After 6pm go to [email or web address] - password [email or web address]  [facility] [place] [facility]  Office  [phone]  CC: Primary care physician; Marisue IvanLINTHAVONG, [name], MD  Note: This dictation was prepared with Dragon dictation along with smaller phrase technology. Any transcriptional errors that result from this process are unintentional.

## 2016-11-13 NOTE — Anesthesia Preprocedure Evaluation (Signed)
Anesthesia Evaluation  Patient identified by MRN, date of birth, ID band Patient awake    Reviewed: Allergy & Precautions, NPO status , Patient's Chart, lab work & pertinent test results  History of Anesthesia Complications Negative for: history of anesthetic complications  Airway Mallampati: III  TM Distance: >3 FB Neck ROM: Full    Dental  (+) Poor Dentition   Pulmonary sleep apnea and Continuous Positive Airway Pressure Ventilation , COPD,  oxygen dependent,    breath sounds clear to auscultation- rhonchi (-) wheezing      Cardiovascular hypertension, Pt. on medications + DOE  (-) CAD and (-) Past MI  Rhythm:Regular Rate:Normal - Systolic murmurs and - Diastolic murmurs    Neuro/Psych  Headaches, Seizures -,  PSYCHIATRIC DISORDERS Depression Schizophrenia    GI/Hepatic negative GI ROS, (+) Cirrhosis  (alcoholic)  Esophageal Varices and ascites    ,   Endo/Other  diabetes, Type 2, Oral Hypoglycemic AgentsMorbid obesity  Renal/GU negative Renal ROS     Musculoskeletal negative musculoskeletal ROS (+)   Abdominal (+) + obese,   Peds  Hematology  (+) anemia ,   Anesthesia Other Findings Past Medical History: No date: Alcoholic cirrhosis of liver with ascites (HCC) No date: Anemia No date: COPD (chronic obstructive pulmonary disease) (* No date: Depression No date: Diabetes (HCC) No date: Diabetes mellitus, type II (HCC) No date: Esophageal varices (HCC) No date: Heart disease No date: Hyperlipemia No date: Hypertension No date: Liver disease No date: Multiple thyroid nodules No date: Portal hypertensive gastropathy No date: Schizophrenia [facility](HCC)   Reproductive/Obstetrics                             Anesthesia Physical Anesthesia Plan  ASA: IV  Anesthesia Plan: General   Post-op Pain Management:    Induction: Intravenous  Airway Management Planned: Natural  Airway  Additional Equipment:   Intra-op Plan:   Post-operative Plan:   Informed Consent: I have reviewed the patients History and Physical, chart, labs and discussed the procedure including the risks, benefits and alternatives for the proposed anesthesia with the patient or authorized representative who has indicated his/her understanding and acceptance.   Dental advisory given  Plan Discussed with: CRNA and Anesthesiologist  Anesthesia Plan Comments:         Anesthesia Quick Evaluation

## 2016-11-13 NOTE — Anesthesia Post-op Follow-up Note (Cosign Needed)
Anesthesia QCDR form completed.        

## 2016-11-13 NOTE — Op Note (Signed)
[facility] Gastroenterology Patient Name: Howard [name] Procedure Date: 11/13/2016 10:47 AM MRN: [id number] Account #: [id number] Date of Birth: 1981/01/19 Admit Type: Inpatient Age: 36 Room: [facility] ENDO ROOM 4 Gender: Male Note Status: Finalized Procedure:            Upper GI endoscopy Indications:          Hematemesis Providers:            Wyline Mood MD, MD Referring MD:         Marisue Ivan (Referring MD) Medicines:            Monitored Anesthesia Care Complications:        No immediate complications. Procedure:            Pre-Anesthesia Assessment:                       - Prior to the procedure, a History and Physical was                        performed, and patient medications, allergies and                        sensitivities were reviewed. The patient's tolerance of                        previous anesthesia was reviewed.                       - The risks and benefits of the procedure and the                        sedation options and risks were discussed with the                        patient. All questions were answered and informed                        consent was obtained.                       - The risks and benefits of the procedure and the                        sedation options and risks were discussed with the                        patient. All questions were answered and informed                        consent was obtained.                       - ASA Grade Assessment: IV - A patient with severe                        systemic disease that is a constant threat to life.                       After obtaining informed consent, the endoscope was  passed under direct vision. Throughout the procedure,                        the patient's blood pressure, pulse, and oxygen                        saturations were monitored continuously. The Endoscope                        was introduced through the mouth, and advanced to  the                        third part of duodenum. The upper GI endoscopy was                        accomplished with ease. The patient tolerated the                        procedure well. Findings:      The examined duodenum was normal.      A medium amount of food (residue) was found in the entire examined       stomach. No blood      Food was found in the entire esophagus.      The lower third of the esophagus was normal. No varices were seen . The       lower end of the esophagus was washed and no varices were seen . Impression:           - Normal examined duodenum.                       - A medium amount of food (residue) in the stomach.                       - Food in the esophagus.                       - Normal lower third of esophagus.                       - No specimens collected. Recommendation:       - Return patient to hospital ward for ongoing care.                       - Advance diet as tolerated today.                       - Continue present medications.                       - Repeat upper endoscopy in 3 months for surveillance.                       - 1. D/c Octreotide                       2. Change to oral PPI                       3. Monitor CBC Procedure Code(s):    --- Professional ---  16109, Esophagogastroduodenoscopy, flexible, transoral;                        diagnostic, including collection of specimen(s) by                        brushing or washing, when performed (separate procedure) Diagnosis Code(s):    --- Professional ---                       U04.540J, Food in esophagus causing other injury,                        initial encounter                       K92.0, Hematemesis CPT copyright 2016 American Medical Association. All rights reserved. The codes documented in this report are preliminary and upon coder review may  be revised to meet current compliance requirements. Wyline Mood, MD Wyline Mood MD, MD 11/13/2016 11:01:18  AM This report has been signed electronically. Number of Addenda: 0 Note Initiated On: 11/13/2016 10:47 AM      [facility]

## 2016-11-14 ENCOUNTER — Encounter: Payer: Self-pay | Admitting: Gastroenterology

## 2016-11-14 LAB — BASIC METABOLIC PANEL
Anion gap: 6 (ref 5–15)
BUN: 11 mg/dL (ref 6–20)
CALCIUM: 8.8 mg/dL — AB (ref 8.9–10.3)
CO2: 39 mmol/L — ABNORMAL HIGH (ref 22–32)
Chloride: 100 mmol/L — ABNORMAL LOW (ref 101–111)
Creatinine, Ser: 0.95 mg/dL (ref 0.61–1.24)
GLUCOSE: 121 mg/dL — AB (ref 65–99)
POTASSIUM: 4.2 mmol/L (ref 3.5–5.1)
SODIUM: 145 mmol/L (ref 135–145)

## 2016-11-14 LAB — GLUCOSE, CAPILLARY: GLUCOSE-CAPILLARY: 125 mg/dL — AB (ref 65–99)

## 2016-11-14 LAB — CBC
HCT: 26.8 % — ABNORMAL LOW (ref 40.0–52.0)
Hemoglobin: 8.1 g/dL — ABNORMAL LOW (ref 13.0–18.0)
MCH: 23.3 pg — ABNORMAL LOW (ref 26.0–34.0)
MCHC: 30.1 g/dL — ABNORMAL LOW (ref 32.0–36.0)
MCV: 77.3 fL — AB (ref 80.0–100.0)
PLATELETS: 93 10*3/uL — AB (ref 150–440)
RBC: 3.47 MIL/uL — AB (ref 4.40–5.90)
RDW: 18.6 % — ABNORMAL HIGH (ref 11.5–14.5)
WBC: 4.9 10*3/uL (ref 3.8–10.6)

## 2016-11-14 LAB — AMMONIA: AMMONIA: 54 umol/L — AB (ref 9–35)

## 2016-11-14 MED ORDER — FUROSEMIDE 10 MG/ML IJ SOLN
40.0000 mg | Freq: Once | INTRAMUSCULAR | Status: AC
  Administered 2016-11-14: 09:00:00 40 mg via INTRAVENOUS
  Filled 2016-11-14: qty 4

## 2016-11-14 MED ORDER — CIPROFLOXACIN HCL 250 MG PO TABS: 250.0000 mg | ORAL_TABLET | Freq: Two times a day (BID) | ORAL | 0 refills | Status: DC

## 2016-11-14 MED ORDER — SENNOSIDES-DOCUSATE SODIUM 8.6-50 MG PO TABS: 2.0000 | ORAL_TABLET | Freq: Every evening | ORAL | 0 refills | Status: DC | PRN

## 2016-11-14 MED ORDER — PANTOPRAZOLE SODIUM 40 MG PO TBEC: 40.0000 mg | DELAYED_RELEASE_TABLET | Freq: Two times a day (BID) | ORAL | 1 refills | Status: DC

## 2016-11-14 MED ORDER — CIPROFLOXACIN HCL 500 MG PO TABS: 500.0000 mg | ORAL_TABLET | Freq: Two times a day (BID) | ORAL | 0 refills | Status: AC

## 2016-11-14 NOTE — Discharge Summary (Addendum)
[facility] [facility] - [facility] at [facility]   PATIENT NAME: Howard [name]    MR#:  [id number]  DATE OF BIRTH:  Mar 10, 1981  DATE OF ADMISSION:  11/12/2016 ADMITTING PHYSICIAN: Auburn Bilberry, MD  DATE OF DISCHARGE: 11/14/2016  PRIMARY CARE PHYSICIAN: Marisue Ivan, MD    ADMISSION DIAGNOSIS:  Hepatic encephalopathy (HCC) [K72.90]  DISCHARGE DIAGNOSIS:  Active Problems:   Hepatic encephalopathy (HCC)   GI bleed.  SECONDARY DIAGNOSIS:   Past Medical History:  Diagnosis Date  . Alcoholic cirrhosis of liver with ascites (HCC)   . Anemia   . COPD (chronic obstructive pulmonary disease) (HCC)   . Depression   . Diabetes (HCC)   . Diabetes mellitus, type II (HCC)   . Esophageal varices (HCC)   . Heart disease   . Hyperlipemia   . Hypertension   . Liver disease   . Multiple thyroid nodules   . Portal hypertensive gastropathy   . Schizophrenia (HCC)     HOSPITAL COURSE:   1. Hepatic encephalopathy due to noncompliance to medications continue his lactulose 30 mg 4 times a day follow-up ammonia level in the morning  came down now.  2. Liver cirrhosis with complaint of dark tarry stools and hematemesis recent diagnosis of hepatic varices which were banded Hemoglobin is stable continue atenolol   Appreciated GI help, EGD done, no active bleed or verices, stopped octreotide, cont PPI.   Started on diet. tolerated.   Advised cont PPI for 8 weeks and have EGD at that time.  Some tenderness on abdomen with distension. ordered ascites tap and on Abx cefuroxime, switched to cipro on advise of GI for 7 days. On [place]- no ascites fluid to drain.  3. Diabetes type 2 Continue metformin and place on sliding scale insulin  4. Sleep apnea with COPD supposed to be on BiPAP at home noncompliant output him back on BiPAP  5. Alcohol abuse monitor for any signs of withdrawal  I counceleld in detail about stopping the ETOH  6. Depression and schizophrenia  continue his home regimen  7. SCDs for DVT prophylaxis  DISCHARGE CONDITIONS:   Stable.  CONSULTS OBTAINED:    DRUG ALLERGIES:  No Known Allergies  DISCHARGE MEDICATIONS:   Current Discharge Medication List    START taking these medications   Details  ciprofloxacin (CIPRO) 500 MG tablet Take 1 tablet (500 mg total) by mouth 2 (two) times daily. Qty: 12 tablet, Refills: 0    senna-docusate (SENOKOT-S) 8.6-50 MG tablet Take 2 tablets by mouth at bedtime as needed for mild constipation. Qty: 20 tablet, Refills: 0      CONTINUE these medications which have CHANGED   Details  pantoprazole (PROTONIX) 40 MG tablet Take 1 tablet (40 mg total) by mouth 2 (two) times daily before a meal. Qty: 60 tablet, Refills: 1      CONTINUE these medications which have NOT CHANGED   Details  acamprosate (CAMPRAL) 333 MG tablet Take 2 tablets (666 mg total) by mouth 3 (three) times daily with meals. Qty: 180 tablet, Refills: 5    albuterol (PROVENTIL HFA;VENTOLIN HFA) 108 (90 Base) MCG/ACT inhaler Inhale 2 puffs into the lungs every 6 (six) hours as needed for wheezing or shortness of breath. Qty: 1 Inhaler, Refills: 2    citalopram (CELEXA) 20 MG tablet Take 1 tablet (20 mg total) by mouth every morning. Qty: 30 tablet, Refills: 5    furosemide (LASIX) 20 MG tablet Take 1 tablet (20 mg total) by mouth daily.  Qty: 30 tablet, Refills: 0    lactulose (CHRONULAC) 10 GM/15ML solution Take 45 mLs (30 g total) by mouth every 4 (four) hours. Qty: 240 mL, Refills: 0    levETIRAcetam (KEPPRA) 1000 MG tablet Take 1 tablet (1,000 mg total) by mouth 2 (two) times daily. Qty: 60 tablet, Refills: 5    losartan (COZAAR) 25 MG tablet Take 25 mg by mouth daily.    metFORMIN (GLUCOPHAGE) 500 MG tablet Take 1 tablet (500 mg total) by mouth 2 (two) times daily with a meal. Qty: 60 tablet, Refills: 0    Multiple Vitamin (MULTIVITAMIN WITH MINERALS) TABS tablet Take 1 tablet by mouth daily. Qty: 30  tablet, Refills: 0    nadolol (CORGARD) 20 MG tablet Take 20 mg by mouth daily.    OLANZapine (ZYPREXA) 15 MG tablet Take 2 tablets (30 mg total) by mouth at bedtime. Qty: 60 tablet, Refills: 5    ondansetron (ZOFRAN) 4 MG tablet Take 1 tablet (4 mg total) by mouth every 8 (eight) hours as needed for nausea or vomiting. Qty: 10 tablet, Refills: 0    rifaximin (XIFAXAN) 550 MG TABS tablet Take 1 tablet (550 mg total) by mouth 2 (two) times daily. Qty: 60 tablet, Refills: 5    spironolactone (ALDACTONE) 50 MG tablet Take 1 tablet (50 mg total) by mouth 2 (two) times daily. Qty: 60 tablet, Refills: 5    sucralfate (CARAFATE) 1 g tablet Take 1 g by mouth 4 (four) times daily.    traZODone (DESYREL) 100 MG tablet Take 1 tablet (100 mg total) by mouth at bedtime as needed for sleep. Qty: 30 tablet, Refills: 1    modafinil (PROVIGIL) 100 MG tablet Take 1 tablet (100 mg total) by mouth daily. Qty: 30 tablet, Refills: 0         DISCHARGE INSTRUCTIONS:    Follow with PMD and GI clinic.  If you experience worsening of your admission symptoms, develop shortness of breath, life threatening emergency, suicidal or homicidal thoughts you must seek medical attention immediately by calling 911 or calling your MD immediately  if symptoms less severe.  You Must read complete instructions/literature along with all the possible adverse reactions/side effects for all the Medicines you take and that have been prescribed to you. Take any new Medicines after you have completely understood and accept all the possible adverse reactions/side effects.   Please note  You were cared for by a hospitalist during your hospital stay. If you have any questions about your discharge medications or the care you received while you were in the hospital after you are discharged, you can call the unit and asked to speak with the hospitalist on call if the hospitalist that took care of you is not available. Once you are  discharged, your primary care physician will handle any further medical issues. Please note that NO REFILLS for any discharge medications will be authorized once you are discharged, as it is imperative that you return to your primary care physician (or establish a relationship with a primary care physician if you do not have one) for your aftercare needs so that they can reassess your need for medications and monitor your lab values.    Today   CHIEF COMPLAINT:   Chief Complaint  Patient presents with  . Nausea    HISTORY OF PRESENT ILLNESS:  Howard [name]  is a 36 y.o. male with a known history of Liver cirrhosis due to alcohol abuse , , COPD, depression, diabetes type 2,  esophageal varices which were recently banded as well as sleep apnea who is noncompliant with this BiPAP machine presents with complaint of having nausea. He also has been having dark tarry stools as well as bright red emesis. According to mom patient also has been acting little confused. His ammonia level is noted to be very high. He has a history of noncompliance with lactulose.  VITAL SIGNS:  Blood pressure 126/71, pulse 63, temperature 98.5 F (36.9 C), temperature source Oral, resp. rate 18, height 6\' 3"  (1.905 m), weight (!) 163.9 kg (361 lb 4.8 oz), SpO2 91 %.  I/O:    Intake/Output Summary (Last 24 hours) at 11/14/16 1103 Last data filed at 11/13/16 2023  Gross per 24 hour  Intake              480 ml  Output              700 ml  Net             -220 ml    PHYSICAL EXAMINATION:   GENERAL:  36 y.o.-year-old patient lying in the bed with no acute distress.  EYES: Pupils equal, round, reactive to light and accommodation. No scleral icterus. Extraocular muscles intact.  HEENT: Head atraumatic, normocephalic. Oropharynx and nasopharynx clear.  NECK:  Supple, no jugular venous distention. No thyroid enlargement, no tenderness.  LUNGS: Normal breath sounds bilaterally, no wheezing, rales,rhonchi or crepitation.  No use of accessory muscles of respiration.  CARDIOVASCULAR: S1, S2 normal. No murmurs, rubs, or gallops.  ABDOMEN: Soft, mild tender, distended. Bowel sounds present. No organomegaly or mass.  EXTREMITIES: No pedal edema, cyanosis, or clubbing.  NEUROLOGIC: Cranial nerves II through XII are intact. Muscle strength 5/5 in all extremities. Sensation intact. Gait not checked. Not much tremors. PSYCHIATRIC: The patient is alert and oriented x 3.  SKIN: No obvious rash, lesion, or ulcer.   DATA REVIEW:   CBC  Recent Labs Lab 11/14/16 0529  WBC 4.9  HGB 8.1*  HCT 26.8*  PLT 93*    Chemistries   Recent Labs Lab 11/12/16 1141  11/14/16 0529  NA 141  < > 145  K 5.1  < > 4.2  CL 100*  < > 100*  CO2 33*  < > 39*  GLUCOSE 178*  < > 121*  BUN 11  < > 11  CREATININE 0.91  < > 0.95  CALCIUM 8.5*  < > 8.8*  AST 43*  --   --   ALT 31  --   --   ALKPHOS 127*  --   --   BILITOT 0.5  --   --   < > = values in this interval not displayed.  Cardiac Enzymes No results for input(s): TROPONINI in the last 168 hours.  Microbiology Results  Results for orders placed or performed during the hospital encounter of 10/04/16  MRSA PCR Screening     Status: None   Collection Time: 10/05/16 11:00 PM  Result Value Ref Range Status   MRSA by PCR NEGATIVE NEGATIVE Final    Comment:        The GeneXpert MRSA Assay (FDA approved for NASAL specimens only), is one component of a comprehensive MRSA colonization surveillance program. It is not intended to diagnose MRSA infection nor to guide or monitor treatment for MRSA infections.     RADIOLOGY:  US Abdomen Limited  Result Date: 11/13/2016 CLINICAL DATA:  36 year old male with abdominal swelling. EXAM: LIMITED ABDOMEN ULTRASOUND FOR ASCITES TECHNIQUE:  Limited ultrasound survey for ascites was performed in all four abdominal quadrants. COMPARISON:  None. FINDINGS: Sonographic interrogation of the 4 quadrants of the abdomen demonstrates no  ascites. IMPRESSION: Negative for ascites. Electronically Signed   By: Malachy Moan M.D.   On: 11/13/2016 17:47    EKG:   Orders placed or performed during the hospital encounter of 11/12/16  . ED EKG  . ED EKG     Management plans discussed with the patient, family and they are in agreement.  CODE STATUS: Full.    Code Status Orders        Start     Ordered   11/12/16 1536  Full code  Continuous     11/12/16 1536    Code Status History    Date Active Date Inactive Code Status Order ID Comments User Context   10/04/2016  6:04 PM 10/08/2016  3:44 PM Full Code [id number]  Shaune Pollack, MD Inpatient   09/22/2016  2:29 AM 09/23/2016  6:38 PM Full Code [id number]  Oralia Manis, MD Inpatient   08/07/2016  7:37 PM 08/12/2016  8:51 PM Full Code [id number]  Gracelyn Nurse, MD Inpatient   07/07/2016  1:40 AM 07/09/2016  6:29 PM Full Code [id number]  Ihor Austin, MD Inpatient   06/10/2016  6:21 AM 06/11/2016  8:25 PM Full Code [id number]  Ihor Austin, MD Inpatient   05/12/2016  3:55 AM 05/15/2016  2:57 PM Full Code [id number]  Ihor Austin, MD Inpatient   04/28/2016  2:54 AM 04/29/2016  6:23 PM Full Code [id number]  Tonye Royalty, DO Inpatient   03/14/2016 12:30 PM 03/16/2016  3:41 PM Full Code [id number]  Shaune Pollack, MD Inpatient   03/03/2016 12:13 PM 03/06/2016  2:26 PM Full Code [id number]  Milagros Loll, MD ED   02/10/2016  5:54 PM 02/12/2016  7:34 PM Full Code [id number]  Ramonita Lab, MD Inpatient   11/11/2015  5:16 AM 11/13/2015  6:00 PM Full Code [id number]  Ihor Austin, MD Inpatient   11/07/2015 10:02 PM 11/09/2015  4:56 PM Full Code [id number]  Enid Baas, MD Inpatient   10/28/2015  8:14 PM 10/29/2015  2:26 PM Full Code [id number]  Altamese Dilling, MD Inpatient   10/16/2015 11:47 PM 10/17/2015  6:00 PM Full Code [id number]  Oralia Manis, MD Inpatient   07/30/2015  5:35 PM 08/04/2015  8:34 PM Full Code [id number]  Enedina Finner, MD Inpatient   07/05/2015  1:16 AM 07/08/2015  7:22 PM Full Code  [id number]  Oralia Manis, MD Inpatient   05/30/2015  7:28 PM 06/01/2015  3:36 PM Full Code [id number]  Audery Amel, MD Inpatient      TOTAL TIME TAKING CARE OF THIS PATIENT: 35 minutes.    Altamese Dilling M.D on 11/14/2016 at 11:03 AM  Between 7am to 6pm - Pager - [phone]  After 6pm go to [email or web address] - password [email or web address]  [facility] [place] [facility]  Office  [phone]  CC: Primary care physician; Marisue Ivan, MD   Note: This dictation was prepared with Dragon dictation along with smaller phrase technology. Any transcriptional errors that result from this process are unintentional.

## 2016-11-14 NOTE — Care Management (Signed)
Discharge to home today per Dr. Elisabeth PigeonVachhani. No needs identified. Home oxygen per [facility]. Mother will transport. Gwenette GreetBrenda S Oseph Imburgia RN MSN CCM Care Management

## 2016-11-14 NOTE — Care Management Important Message (Signed)
Important Message  Patient Details  Name: Howard [name] MRN: [id number] Date of Birth: 1981/06/01   Medicare Important Message Given:  Yes    Gwenette GreetBrenda S Maurice Ramseur, RN 11/14/2016, 8:32 AM

## 2016-11-14 NOTE — Plan of Care (Signed)
MD making rounds. Order received to discharge home. IV's removed. Provided with education handout. Provided with prescriptions. Discharge paperwork provided, explained, signed and witnessed. No unanswered questions. Discharged via wheelchair by nursing staff. Belongings sent with patient and family.

## 2016-11-18 ENCOUNTER — Ambulatory Visit (INDEPENDENT_AMBULATORY_CARE_PROVIDER_SITE_OTHER): Payer: Medicare Other | Admitting: Psychiatry

## 2016-11-18 ENCOUNTER — Encounter: Payer: Self-pay | Admitting: Psychiatry

## 2016-11-18 VITALS — BP 123/74 | HR 85 | Temp 97.9°F | Wt 362.8 lb

## 2016-11-18 DIAGNOSIS — F200 Paranoid schizophrenia: Secondary | ICD-10-CM

## 2016-11-18 DIAGNOSIS — F1010 Alcohol abuse, uncomplicated: Secondary | ICD-10-CM

## 2016-11-18 NOTE — Progress Notes (Signed)
Follow-up patient with schizophrenia and alcohol abuse. Just out of the hospital a few days ago after being admitted with hepatic encephalopathy. Patient is currently denying any hallucinations. Mood is a little bit down. Sleep is just now recovering. No suicidal ideation. Not having any hallucinations.  Casually dressed. Sickly looking but stable. Affect euthymic. Mood okay. Thoughts slow but lucid. Appropriate interaction. No evidence of acute dangerousness.  Once again reviewed with him how he has multiple ways in which she may be killing himself with alcohol. Reviewed how much he absolutely needs to stop drinking. Encourage him to be going to [employer] meetings. Continue Campral and Zyprexa. Follow-up in another 3 months.

## 2016-11-21 ENCOUNTER — Telehealth: Payer: Self-pay

## 2016-11-21 NOTE — Telephone Encounter (Signed)
received notice that medication for olanzapine 15mg  was approved from  09-22-16 to 09-21-17  Member ID #  [id number]

## 2016-11-21 NOTE — Telephone Encounter (Signed)
received notice that a prior auth is needed for olanzapine

## 2016-11-21 NOTE — Telephone Encounter (Signed)
prior authorization was submited and pending review 

## 2016-11-23 ENCOUNTER — Encounter: Payer: Self-pay | Admitting: Emergency Medicine

## 2016-11-23 ENCOUNTER — Inpatient Hospital Stay
Admission: EM | Admit: 2016-11-23 | Discharge: 2016-11-25 | DRG: 433 | Disposition: A | Discharge status: Home / Self Care | Payer: Medicare Other | Attending: Internal Medicine | Admitting: Internal Medicine

## 2016-11-23 DIAGNOSIS — K7040 Alcoholic hepatic failure without coma: Principal | ICD-10-CM | POA: Diagnosis present

## 2016-11-23 DIAGNOSIS — Z8249 Family history of ischemic heart disease and other diseases of the circulatory system: Secondary | ICD-10-CM | POA: Diagnosis not present

## 2016-11-23 DIAGNOSIS — Z7984 Long term (current) use of oral hypoglycemic drugs: Secondary | ICD-10-CM

## 2016-11-23 DIAGNOSIS — Z811 Family history of alcohol abuse and dependence: Secondary | ICD-10-CM

## 2016-11-23 DIAGNOSIS — G4730 Sleep apnea, unspecified: Secondary | ICD-10-CM | POA: Diagnosis present

## 2016-11-23 DIAGNOSIS — F329 Major depressive disorder, single episode, unspecified: Secondary | ICD-10-CM | POA: Diagnosis present

## 2016-11-23 DIAGNOSIS — F209 Schizophrenia, unspecified: Secondary | ICD-10-CM | POA: Diagnosis present

## 2016-11-23 DIAGNOSIS — K7030 Alcoholic cirrhosis of liver without ascites: Secondary | ICD-10-CM | POA: Diagnosis present

## 2016-11-23 DIAGNOSIS — E119 Type 2 diabetes mellitus without complications: Secondary | ICD-10-CM | POA: Diagnosis present

## 2016-11-23 DIAGNOSIS — F1020 Alcohol dependence, uncomplicated: Secondary | ICD-10-CM | POA: Diagnosis present

## 2016-11-23 DIAGNOSIS — K7290 Hepatic failure, unspecified without coma: Secondary | ICD-10-CM

## 2016-11-23 DIAGNOSIS — K7682 Hepatic encephalopathy: Secondary | ICD-10-CM

## 2016-11-23 DIAGNOSIS — J449 Chronic obstructive pulmonary disease, unspecified: Secondary | ICD-10-CM | POA: Diagnosis present

## 2016-11-23 DIAGNOSIS — Z79899 Other long term (current) drug therapy: Secondary | ICD-10-CM

## 2016-11-23 DIAGNOSIS — I10 Essential (primary) hypertension: Secondary | ICD-10-CM | POA: Diagnosis present

## 2016-11-23 DIAGNOSIS — E785 Hyperlipidemia, unspecified: Secondary | ICD-10-CM | POA: Diagnosis present

## 2016-11-23 DIAGNOSIS — K766 Portal hypertension: Secondary | ICD-10-CM | POA: Diagnosis present

## 2016-11-23 DIAGNOSIS — R42 Dizziness and giddiness: Secondary | ICD-10-CM | POA: Diagnosis not present

## 2016-11-23 DIAGNOSIS — I8500 Esophageal varices without bleeding: Secondary | ICD-10-CM | POA: Diagnosis present

## 2016-11-23 DIAGNOSIS — K3189 Other diseases of stomach and duodenum: Secondary | ICD-10-CM | POA: Diagnosis present

## 2016-11-23 LAB — BASIC METABOLIC PANEL
ANION GAP: 7 (ref 5–15)
BUN: 17 mg/dL (ref 6–20)
CALCIUM: 8.8 mg/dL — AB (ref 8.9–10.3)
CO2: 30 mmol/L (ref 22–32)
CREATININE: 0.93 mg/dL (ref 0.61–1.24)
Chloride: 101 mmol/L (ref 101–111)
GFR calc Af Amer: >60 mL/min (ref >60)
GFR calc non Af Amer: >60 mL/min (ref >60)
GLUCOSE: 330 mg/dL — AB (ref 65–99)
Potassium: 4.3 mmol/L (ref 3.5–5.1)
Sodium: 138 mmol/L (ref 135–145)

## 2016-11-23 LAB — URINALYSIS, COMPLETE (UACMP) WITH MICROSCOPIC
Bacteria, UA: NONE SEEN
Bilirubin Urine: NEGATIVE
Glucose, UA: >=500 mg/dL — AB
Ketones, ur: NEGATIVE mg/dL
Leukocytes, UA: NEGATIVE
Nitrite: NEGATIVE
Protein, ur: NEGATIVE mg/dL
SPECIFIC GRAVITY, URINE: 1.012 (ref 1.005–1.030)
pH: 6 (ref 5.0–8.0)

## 2016-11-23 LAB — CBC
HCT: 31.4 % — ABNORMAL LOW (ref 40.0–52.0)
HEMOGLOBIN: 9.7 g/dL — AB (ref 13.0–18.0)
MCH: 24.7 pg — ABNORMAL LOW (ref 26.0–34.0)
MCHC: 30.8 g/dL — ABNORMAL LOW (ref 32.0–36.0)
MCV: 80.2 fL (ref 80.0–100.0)
Platelets: 122 10*3/uL — ABNORMAL LOW (ref 150–440)
RBC: 3.91 MIL/uL — ABNORMAL LOW (ref 4.40–5.90)
RDW: 21.9 % — ABNORMAL HIGH (ref 11.5–14.5)
WBC: 6.2 10*3/uL (ref 3.8–10.6)

## 2016-11-23 LAB — AMMONIA: AMMONIA: 107 umol/L — AB (ref 9–35)

## 2016-11-23 MED ORDER — LACTULOSE 10 GM/15ML PO SOLN
30.0000 g | Freq: Once | ORAL | Status: AC
  Administered 2016-11-24: 30 g via ORAL
  Filled 2016-11-23: qty 60

## 2016-11-23 NOTE — ED Provider Notes (Signed)
[facility] Emergency Department Provider Note   ____________________________________________   I have reviewed the triage vital signs and the nursing notes.   HISTORY  Chief Complaint Dizziness   History limited by: Not Limited   HPI Howard [name] is a 36 y.o. male who presents to the emergency department today because of concerns for dizziness, increased confusion. Patient states that he has not been feeling well for the past 2 days. It started with some nausea and vomiting. Today the dizziness has gotten worse. He describes it as the room spinning around him. Patient states he has taken his lactulose 2 times a day. Had a recent admission for hepatic encephalopathy. States he is averaging 3 bowel movements a day.   Past Medical History:  Diagnosis Date  . Alcoholic cirrhosis of liver with ascites (HCC)   . Anemia   . COPD (chronic obstructive pulmonary disease) (HCC)   . Depression   . Diabetes (HCC)   . Diabetes mellitus, type II (HCC)   . Esophageal varices (HCC)   . Heart disease   . Hyperlipemia   . Hypertension   . Liver disease   . Multiple thyroid nodules   . Portal hypertensive gastropathy   . Schizophrenia [facility])     Patient Active Problem List   Diagnosis Date Noted  . GIB (gastrointestinal bleeding) 10/05/2016  . Ascites 10/04/2016  . DNR (do not resuscitate) discussion 08/11/2016  . Palliative care by specialist 08/11/2016  . Muscle weakness (generalized)   . Acute upper GI bleed   . GI bleed 08/07/2016  . Alcoholic cirrhosis of liver (HCC) 07/09/2016  . Acute respiratory failure with hypercapnia (HCC) 07/09/2016  . Hyperglycemia 07/09/2016  . Seizure (HCC) 06/10/2016  . Altered mental status 05/12/2016  . Headache 04/29/2016  . OSA (obstructive sleep apnea) 04/29/2016  . Anemia 04/29/2016  . Thrombocytopenia (HCC) 04/29/2016  . Coagulopathy (HCC) 04/29/2016  . Obesity 04/29/2016  . Generalized weakness 04/29/2016  .  Acute hepatic encephalopathy 03/14/2016  . Controlled type 2 diabetes mellitus without complication (HCC) 01/15/2016  . Normocytic anemia 12/12/2015  . Essential (primary) hypertension 12/11/2015  . Pure hypercholesterolemia 12/11/2015  . Fever 11/11/2015  . Ascites due to alcoholic cirrhosis (HCC) 11/11/2015  . Cirrhosis of liver with ascites (HCC) 11/11/2015  . Hepatic encephalopathy (HCC) 10/16/2015  . Type 2 diabetes mellitus (HCC) 10/16/2015  . Acute renal failure (ARF) (HCC) 07/30/2015  . 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 Alcoholic cirrhosis (HCC) 07/19/2015  . Hypoxia 07/04/2015  . Elevated transaminase level 07/04/2015  . DOE (dyspnea on exertion) 07/04/2015  . Alcohol abuse 05/31/2015  . Hypertension 05/29/2015  . Paranoid schizophrenia (HCC) 03/20/2015    Past Surgical History:  Procedure Laterality Date  . ESOPHAGOGASTRODUODENOSCOPY N/A 10/05/2015   Procedure: ESOPHAGOGASTRODUODENOSCOPY (EGD);  Surgeon: Elnita Maxwell, MD;  Location: [facility] ENDOSCOPY;  Service: Endoscopy;  Laterality: N/A;  . ESOPHAGOGASTRODUODENOSCOPY (EGD) WITH PROPOFOL N/A 08/03/2015   Procedure: ESOPHAGOGASTRODUODENOSCOPY (EGD) WITH PROPOFOL;  Surgeon: Elnita Maxwell, MD;  Location: [facility] ENDOSCOPY;  Service: Endoscopy;  Laterality: N/A;  . ESOPHAGOGASTRODUODENOSCOPY (EGD) WITH PROPOFOL N/A 08/31/2015   Procedure: ESOPHAGOGASTRODUODENOSCOPY (EGD) WITH PROPOFOL;  Surgeon: Elnita Maxwell, MD;  Location: [facility] ENDOSCOPY;  Service: Endoscopy;  Laterality: N/A;  . ESOPHAGOGASTRODUODENOSCOPY (EGD) WITH PROPOFOL N/A 04/04/2016   Procedure: ESOPHAGOGASTRODUODENOSCOPY (EGD) WITH PROPOFOL;  Surgeon: Scot Jun, MD;  Location: [facility] ENDOSCOPY;  Service: Endoscopy;  Laterality: N/A;  . ESOPHAGOGASTRODUODENOSCOPY (EGD) WITH PROPOFOL N/A 11/13/2016  Procedure: ESOPHAGOGASTRODUODENOSCOPY (EGD) WITH PROPOFOL;  Surgeon: Wyline Mood, MD;  Location: [facility] ENDOSCOPY;  Service:  Endoscopy;  Laterality: N/A;  . NO PAST SURGERIES      Prior to Admission medications   Medication Sig Start Date End Date Taking? Authorizing Provider  acamprosate (CAMPRAL) 333 MG tablet Take 2 tablets (666 mg total) by mouth 3 (three) times daily with meals. 08/19/16   Audery Amel, MD  albuterol (PROVENTIL HFA;VENTOLIN HFA) 108 (90 Base) MCG/ACT inhaler Inhale 2 puffs into the lungs every 6 (six) hours as needed for wheezing or shortness of breath. 10/08/16   Shaune Pollack, MD  citalopram (CELEXA) 20 MG tablet Take 1 tablet (20 mg total) by mouth every morning. 08/19/16   Audery Amel, MD  furosemide (LASIX) 20 MG tablet Take 1 tablet (20 mg total) by mouth daily. 05/15/16   Alford Highland, MD  lactulose (CHRONULAC) 10 GM/15ML solution Take 45 mLs (30 g total) by mouth every 4 (four) hours. 07/09/16   Katharina Caper, MD  levETIRAcetam (KEPPRA) 1000 MG tablet Take 1 tablet (1,000 mg total) by mouth 2 (two) times daily. Patient taking differently: Take 750 mg by mouth 2 (two) times daily.  07/09/16   Katharina Caper, MD  losartan (COZAAR) 25 MG tablet Take 25 mg by mouth daily.    Historical Provider, MD  metFORMIN (GLUCOPHAGE) 500 MG tablet Take 1 tablet (500 mg total) by mouth 2 (two) times daily with a meal. 05/15/16   Alford Highland, MD  modafinil (PROVIGIL) 100 MG tablet Take 1 tablet (100 mg total) by mouth daily. 08/13/16   Auburn Bilberry, MD  Multiple Vitamin (MULTIVITAMIN WITH MINERALS) TABS tablet Take 1 tablet by mouth daily. 08/04/15   Ramonita Lab, MD  nadolol (CORGARD) 20 MG tablet Take 20 mg by mouth daily.    Historical Provider, MD  OLANZapine (ZYPREXA) 15 MG tablet Take 2 tablets (30 mg total) by mouth at bedtime. 05/22/16   Audery Amel, MD  ondansetron (ZOFRAN) 4 MG tablet Take 1 tablet (4 mg total) by mouth every 8 (eight) hours as needed for nausea or vomiting. 08/26/16   Governor Rooks, MD  pantoprazole (PROTONIX) 40 MG tablet Take 1 tablet (40 mg total) by mouth 2 (two) times  daily before a meal. 11/14/16   Altamese Dilling, MD  rifaximin (XIFAXAN) 550 MG TABS tablet Take 1 tablet (550 mg total) by mouth 2 (two) times daily. 07/09/16   Katharina Caper, MD  senna-docusate (SENOKOT-S) 8.6-50 MG tablet Take 2 tablets by mouth at bedtime as needed for mild constipation. 11/14/16   Altamese Dilling, MD  spironolactone (ALDACTONE) 50 MG tablet Take 1 tablet (50 mg total) by mouth 2 (two) times daily. 07/09/16   Katharina Caper, MD  sucralfate (CARAFATE) 1 g tablet Take 1 g by mouth 4 (four) times daily.    Historical Provider, MD    Allergies Patient has no known allergies.  Family History  Problem Relation Age of Onset  . Heart disease Mother   . Hypertension Mother   . Hyperlipidemia Mother   . Stroke Father   . Heart attack Father   . Hypertension Father   . Heart disease Father   . Alcohol abuse Father   . Heart disease Brother     Social History Social History  Substance Use Topics  . Smoking status: Never Smoker  . Smokeless tobacco: Never Used  . Alcohol use 30.0 oz/week    50 Cans of beer per week  Comment: last drink monday 10/13/16    Review of Systems  Constitutional: Negative for fever. Cardiovascular: Negative for chest pain. Respiratory: Negative for shortness of breath. Gastrointestinal: Negative for abdominal pain, vomiting and diarrhea. Neurological: Negative for headaches, focal weakness or numbness.  10-point ROS otherwise negative.  ____________________________________________   PHYSICAL EXAM:  VITAL SIGNS: ED Triage Vitals  Enc Vitals Group     BP 11/23/16 1935 128/68     Pulse Rate 11/23/16 1935 88     Resp 11/23/16 1935 18     Temp 11/23/16 1935 97.9 F (36.6 C)     Temp Source 11/23/16 1935 Oral     SpO2 11/23/16 1935 91 %     Weight 11/23/16 1930 (!) 361 lb (163.7 kg)     Height 11/23/16 1930 6\' 3"  (1.905 m)     Head Circumference --      Peak Flow --      Pain Score 11/23/16 1930 0   Constitutional:  Alert and oriented. Well appearing and in no distress. Eyes: Conjunctivae are normal. Normal extraocular movements. ENT   Head: Normocephalic and atraumatic.   Nose: No congestion/rhinnorhea.   Mouth/Throat: Mucous membranes are moist.   Neck: No stridor. Hematological/Lymphatic/Immunilogical: No cervical lymphadenopathy. Cardiovascular: Normal rate, regular rhythm.  No murmurs, rubs, or gallops.  Respiratory: Normal respiratory effort without tachypnea nor retractions. Breath sounds are clear and equal bilaterally. No wheezes/rales/rhonchi. Gastrointestinal: Soft and non tender. No rebound. No guarding.  Genitourinary: Deferred Musculoskeletal: Normal range of motion in all extremities.  Neurologic:  Slight confusion. Asterixis. Moving all extremities.  Skin:  Skin is warm, dry and intact. No rash noted.   ____________________________________________    LABS (pertinent positives/negatives)  Labs Reviewed  BASIC METABOLIC PANEL - Abnormal; Notable for the following:       Result Value   Glucose, Bld 330 (*)    Calcium 8.8 (*)    All other components within normal limits  CBC - Abnormal; Notable for the following:    RBC 3.91 (*)    Hemoglobin 9.7 (*)    HCT 31.4 (*)    MCH 24.7 (*)    MCHC 30.8 (*)    RDW 21.9 (*)    Platelets 122 (*)    All other components within normal limits  URINALYSIS, COMPLETE (UACMP) WITH MICROSCOPIC - Abnormal; Notable for the following:    Color, Urine YELLOW (*)    APPearance CLEAR (*)    Glucose, UA >=500 (*)    Hgb urine dipstick LARGE (*)    Squamous Epithelial / LPF 0-5 (*)    All other components within normal limits  AMMONIA - Abnormal; Notable for the following:    Ammonia 107 (*)    All other components within normal limits     ____________________________________________   EKG  I, Phineas Semen, attending physician, personally viewed and interpreted this EKG  EKG Time: 1940 Rate: 87 Rhythm: normal sinus  rhythm Axis: normal Intervals: qtc 462 QRS: narrow ST changes: no st elevation Impression: normal ekg   ____________________________________________    RADIOLOGY  None  ____________________________________________   PROCEDURES  Procedures  ____________________________________________   INITIAL IMPRESSION / ASSESSMENT AND PLAN / ED COURSE  Pertinent labs & imaging results that were available during my care of the patient were reviewed by me and considered in my medical decision making (see chart for details).  Patient presented to the emergency department today because of concerns for dizziness. Patient is well-known emergency department and recently  had an admission for hepatic encephalopathy. The patient's ammonia was elevated to 107 here in the emergency department. Patient will be given lactulose and admitted.  ____________________________________________   FINAL CLINICAL IMPRESSION(S) / ED DIAGNOSES  Final diagnoses:  Hepatic encephalopathy (HCC)     Note: This dictation was prepared with Dragon dictation. Any transcriptional errors that result from this process are unintentional     Phineas SemenGraydon Demareon Coldwell, MD 11/23/16 2324

## 2016-11-23 NOTE — ED Notes (Signed)
ED Provider at bedside. 

## 2016-11-23 NOTE — ED Triage Notes (Signed)
Pt c/o dizziness x 2-3 days with worsening approx 2-3 hrs ago. Pt is alert and oriented at this time.

## 2016-11-24 LAB — PROTIME-INR
INR: 1.22
PROTHROMBIN TIME: 15.5 s — AB (ref 11.4–15.2)

## 2016-11-24 LAB — GLUCOSE, CAPILLARY
GLUCOSE-CAPILLARY: 97 mg/dL (ref 65–99)
GLUCOSE-CAPILLARY: 203 mg/dL — AB (ref 65–99)
Glucose-Capillary: 104 mg/dL — ABNORMAL HIGH (ref 65–99)
Glucose-Capillary: 157 mg/dL — ABNORMAL HIGH (ref 65–99)

## 2016-11-24 LAB — PHOSPHORUS: PHOSPHORUS: 3 mg/dL (ref 2.5–4.6)

## 2016-11-24 LAB — COMPREHENSIVE METABOLIC PANEL
ALT: 35 U/L (ref 17–63)
AST: 47 U/L — AB (ref 15–41)
Albumin: 3.7 g/dL (ref 3.5–5.0)
Alkaline Phosphatase: 101 U/L (ref 38–126)
Anion gap: 4 — ABNORMAL LOW (ref 5–15)
BUN: 16 mg/dL (ref 6–20)
CHLORIDE: 102 mmol/L (ref 101–111)
CO2: 33 mmol/L — AB (ref 22–32)
CREATININE: 0.95 mg/dL (ref 0.61–1.24)
Calcium: 8.8 mg/dL — ABNORMAL LOW (ref 8.9–10.3)
GFR calc Af Amer: >60 mL/min (ref >60)
GFR calc non Af Amer: >60 mL/min (ref >60)
Glucose, Bld: 183 mg/dL — ABNORMAL HIGH (ref 65–99)
Potassium: 3.9 mmol/L (ref 3.5–5.1)
SODIUM: 139 mmol/L (ref 135–145)
Total Bilirubin: 0.7 mg/dL (ref 0.3–1.2)
Total Protein: 8.1 g/dL (ref 6.5–8.1)

## 2016-11-24 LAB — CBC
HCT: 30.2 % — ABNORMAL LOW (ref 40.0–52.0)
HEMOGLOBIN: 9.3 g/dL — AB (ref 13.0–18.0)
MCH: 24.6 pg — ABNORMAL LOW (ref 26.0–34.0)
MCHC: 30.9 g/dL — ABNORMAL LOW (ref 32.0–36.0)
MCV: 79.4 fL — ABNORMAL LOW (ref 80.0–100.0)
Platelets: 113 10*3/uL — ABNORMAL LOW (ref 150–440)
RBC: 3.8 MIL/uL — AB (ref 4.40–5.90)
RDW: 21.7 % — ABNORMAL HIGH (ref 11.5–14.5)
WBC: 7.1 10*3/uL (ref 3.8–10.6)

## 2016-11-24 LAB — AMMONIA: Ammonia: 94 umol/L — ABNORMAL HIGH (ref 9–35)

## 2016-11-24 LAB — MAGNESIUM: MAGNESIUM: 1.9 mg/dL (ref 1.7–2.4)

## 2016-11-24 MED ORDER — SODIUM CHLORIDE 0.9 % IV SOLN: 250.0000 mL | INTRAVENOUS | Status: DC | PRN

## 2016-11-24 MED ORDER — RIFAXIMIN 550 MG PO TABS
550.0000 mg | ORAL_TABLET | Freq: Two times a day (BID) | ORAL | Status: DC
Start: 2016-11-24 — End: 2016-11-25
  Administered 2016-11-24 – 2016-11-25 (×3): 550 mg via ORAL
  Filled 2016-11-24 (×6): qty 1

## 2016-11-24 MED ORDER — CITALOPRAM HYDROBROMIDE 20 MG PO TABS
20.0000 mg | ORAL_TABLET | ORAL | Status: DC
  Administered 2016-11-24 – 2016-11-25 (×2): 20 mg via ORAL
  Filled 2016-11-24 (×2): qty 1

## 2016-11-24 MED ORDER — LORAZEPAM 2 MG/ML IJ SOLN: 1.0000 mg | Freq: Four times a day (QID) | INTRAMUSCULAR | Status: DC | PRN

## 2016-11-24 MED ORDER — ONDANSETRON HCL 4 MG/2ML IJ SOLN: 4.0000 mg | Freq: Four times a day (QID) | INTRAMUSCULAR | Status: DC | PRN

## 2016-11-24 MED ORDER — ACETAMINOPHEN 650 MG RE SUPP: 650.0000 mg | Freq: Four times a day (QID) | RECTAL | Status: DC | PRN

## 2016-11-24 MED ORDER — ADULT MULTIVITAMIN W/MINERALS CH
1.0000 | ORAL_TABLET | Freq: Every day | ORAL | Status: DC
  Administered 2016-11-25: 09:00:00 1 via ORAL
  Filled 2016-11-24 (×2): qty 1

## 2016-11-24 MED ORDER — LORAZEPAM 1 MG PO TABS: 1.0000 mg | ORAL_TABLET | Freq: Four times a day (QID) | ORAL | Status: DC | PRN

## 2016-11-24 MED ORDER — SODIUM CHLORIDE 0.9% FLUSH
3.0000 mL | Freq: Two times a day (BID) | INTRAVENOUS | Status: DC
  Administered 2016-11-24 – 2016-11-25 (×3): 3 mL via INTRAVENOUS

## 2016-11-24 MED ORDER — IPRATROPIUM BROMIDE 0.02 % IN SOLN: 0.5000 mg | Freq: Four times a day (QID) | RESPIRATORY_TRACT | Status: DC | PRN

## 2016-11-24 MED ORDER — VITAMIN B-1 100 MG PO TABS
100.0000 mg | ORAL_TABLET | Freq: Every day | ORAL | Status: DC
  Administered 2016-11-24 – 2016-11-25 (×2): 100 mg via ORAL
  Filled 2016-11-24 (×2): qty 1

## 2016-11-24 MED ORDER — SPIRONOLACTONE 25 MG PO TABS
50.0000 mg | ORAL_TABLET | Freq: Two times a day (BID) | ORAL | Status: DC
  Administered 2016-11-24 – 2016-11-25 (×3): 50 mg via ORAL
  Filled 2016-11-24 (×3): qty 2

## 2016-11-24 MED ORDER — SUCRALFATE 1 G PO TABS
1.0000 g | ORAL_TABLET | Freq: Four times a day (QID) | ORAL | Status: DC
  Administered 2016-11-24 – 2016-11-25 (×5): 1 g via ORAL
  Filled 2016-11-24 (×5): qty 1

## 2016-11-24 MED ORDER — NADOLOL 20 MG PO TABS
20.0000 mg | ORAL_TABLET | Freq: Every day | ORAL | Status: DC
Start: 2016-11-24 — End: 2016-11-25
  Administered 2016-11-24 – 2016-11-25 (×2): 20 mg via ORAL
  Filled 2016-11-24 (×2): qty 1

## 2016-11-24 MED ORDER — ACAMPROSATE CALCIUM 333 MG PO TBEC
666.0000 mg | DELAYED_RELEASE_TABLET | Freq: Three times a day (TID) | ORAL | Status: DC
  Administered 2016-11-24 – 2016-11-25 (×4): 666 mg via ORAL
  Filled 2016-11-24 (×5): qty 2

## 2016-11-24 MED ORDER — SODIUM CHLORIDE 0.9% FLUSH: 3.0000 mL | INTRAVENOUS | Status: DC | PRN

## 2016-11-24 MED ORDER — OLANZAPINE 10 MG PO TABS
30.0000 mg | ORAL_TABLET | Freq: Every day | ORAL | Status: DC
  Administered 2016-11-24: 30 mg via ORAL
  Filled 2016-11-24: qty 3

## 2016-11-24 MED ORDER — ADULT MULTIVITAMIN W/MINERALS CH
1.0000 | ORAL_TABLET | Freq: Every day | ORAL | Status: DC
  Administered 2016-11-24: 10:00:00 1 via ORAL

## 2016-11-24 MED ORDER — ALBUTEROL SULFATE (2.5 MG/3ML) 0.083% IN NEBU: 3.0000 mL | INHALATION_SOLUTION | Freq: Four times a day (QID) | RESPIRATORY_TRACT | Status: DC | PRN

## 2016-11-24 MED ORDER — ALBUTEROL SULFATE (2.5 MG/3ML) 0.083% IN NEBU: 2.5000 mg | INHALATION_SOLUTION | Freq: Four times a day (QID) | RESPIRATORY_TRACT | Status: DC | PRN

## 2016-11-24 MED ORDER — SENNOSIDES-DOCUSATE SODIUM 8.6-50 MG PO TABS: 2.0000 | ORAL_TABLET | Freq: Every evening | ORAL | Status: DC | PRN

## 2016-11-24 MED ORDER — FOLIC ACID 1 MG PO TABS
1.0000 mg | ORAL_TABLET | Freq: Every day | ORAL | Status: DC
  Administered 2016-11-24 – 2016-11-25 (×2): 1 mg via ORAL
  Filled 2016-11-24 (×2): qty 1

## 2016-11-24 MED ORDER — LEVETIRACETAM 750 MG PO TABS
750.0000 mg | ORAL_TABLET | Freq: Two times a day (BID) | ORAL | Status: DC
  Administered 2016-11-24 – 2016-11-25 (×3): 750 mg via ORAL
  Filled 2016-11-24 (×3): qty 1

## 2016-11-24 MED ORDER — FUROSEMIDE 20 MG PO TABS
20.0000 mg | ORAL_TABLET | Freq: Every day | ORAL | Status: DC
  Administered 2016-11-24 – 2016-11-25 (×2): 20 mg via ORAL
  Filled 2016-11-24 (×2): qty 1

## 2016-11-24 MED ORDER — ACETAMINOPHEN 325 MG PO TABS
650.0000 mg | ORAL_TABLET | Freq: Four times a day (QID) | ORAL | Status: DC | PRN
  Administered 2016-11-25: 05:00:00 650 mg via ORAL
  Filled 2016-11-24: qty 2

## 2016-11-24 MED ORDER — INSULIN ASPART 100 UNIT/ML ~~LOC~~ SOLN: 0.0000 [IU] | Freq: Every day | SUBCUTANEOUS | Status: DC

## 2016-11-24 MED ORDER — LOSARTAN POTASSIUM 50 MG PO TABS
25.0000 mg | ORAL_TABLET | Freq: Every day | ORAL | Status: DC
  Administered 2016-11-24 – 2016-11-25 (×2): 25 mg via ORAL
  Filled 2016-11-24 (×2): qty 1

## 2016-11-24 MED ORDER — SODIUM CHLORIDE 0.9% FLUSH: 3.0000 mL | Freq: Two times a day (BID) | INTRAVENOUS | Status: DC

## 2016-11-24 MED ORDER — THIAMINE HCL 100 MG/ML IJ SOLN: 100.0000 mg | Freq: Every day | INTRAMUSCULAR | Status: DC

## 2016-11-24 MED ORDER — ONDANSETRON HCL 4 MG PO TABS: 4.0000 mg | ORAL_TABLET | Freq: Four times a day (QID) | ORAL | Status: DC | PRN

## 2016-11-24 MED ORDER — INSULIN ASPART 100 UNIT/ML ~~LOC~~ SOLN
0.0000 [IU] | Freq: Three times a day (TID) | SUBCUTANEOUS | Status: DC
  Administered 2016-11-24: 12:00:00 3 [IU] via SUBCUTANEOUS
  Administered 2016-11-25: 12:00:00 1 [IU] via SUBCUTANEOUS
  Filled 2016-11-24: qty 1
  Filled 2016-11-24: qty 3

## 2016-11-24 MED ORDER — PANTOPRAZOLE SODIUM 40 MG PO TBEC
40.0000 mg | DELAYED_RELEASE_TABLET | Freq: Two times a day (BID) | ORAL | Status: DC
  Administered 2016-11-24 – 2016-11-25 (×3): 40 mg via ORAL
  Filled 2016-11-24 (×3): qty 1

## 2016-11-24 MED ORDER — LACTULOSE 10 GM/15ML PO SOLN
30.0000 g | ORAL | Status: DC
  Administered 2016-11-24 – 2016-11-25 (×8): 30 g via ORAL
  Filled 2016-11-24 (×8): qty 60

## 2016-11-24 MED ORDER — MODAFINIL 100 MG PO TABS
100.0000 mg | ORAL_TABLET | Freq: Every day | ORAL | Status: DC
  Administered 2016-11-24 – 2016-11-25 (×2): 100 mg via ORAL
  Filled 2016-11-24 (×2): qty 1

## 2016-11-24 MED ORDER — ONDANSETRON HCL 4 MG PO TABS: 4.0000 mg | ORAL_TABLET | Freq: Three times a day (TID) | ORAL | Status: DC | PRN

## 2016-11-24 MED ORDER — HEPARIN SODIUM (PORCINE) 5000 UNIT/ML IJ SOLN
5000.0000 [IU] | Freq: Three times a day (TID) | INTRAMUSCULAR | Status: DC
  Administered 2016-11-24 – 2016-11-25 (×4): 5000 [IU] via SUBCUTANEOUS
  Filled 2016-11-24 (×4): qty 1

## 2016-11-24 NOTE — Progress Notes (Signed)
[facility]   PATIENT NAME: Howard [name]    MR#:  [id number]  DATE OF BIRTH:  Sep 22, 1981  SUBJECTIVE:  Patient still with mild confusion this am   REVIEW OF SYSTEMS:    Review of Systems  Constitutional: Negative.  Negative for chills, fever and malaise/fatigue.  HENT: Negative.  Negative for ear discharge, ear pain, hearing loss, nosebleeds and sore throat.   Eyes: Negative.  Negative for blurred vision and pain.  Respiratory: Negative.  Negative for cough, hemoptysis, shortness of breath and wheezing.   Cardiovascular: Negative.  Negative for chest pain, palpitations and leg swelling.  Gastrointestinal: Negative.  Negative for abdominal pain, blood in stool, diarrhea, nausea and vomiting.  Genitourinary: Negative.  Negative for dysuria.  Musculoskeletal: Negative.  Negative for back pain.  Skin: Negative.   Neurological: Negative for dizziness, tremors, speech change, focal weakness, seizures and headaches.  Endo/Heme/Allergies: Negative.  Does not bruise/bleed easily.  Psychiatric/Behavioral: Positive for memory loss. Negative for depression, hallucinations and suicidal ideas.    Tolerating Diet: yes      DRUG ALLERGIES:  No Known Allergies  VITALS:  Blood pressure 140/72, pulse 88, temperature 98 F (36.7 C), temperature source Oral, resp. rate 18, height 6\' 3"  (1.905 m), weight (!) 163.7 kg (361 lb), SpO2 94 %.  PHYSICAL EXAMINATION:   Physical Exam  Constitutional: He is oriented to person, place, and time and well-developed, well-nourished, and in no distress. No distress.  HENT:  Head: Normocephalic.  Eyes: No scleral icterus.  Neck: Normal range of motion. Neck supple. No JVD present. No tracheal deviation present.  Cardiovascular: Normal rate, regular rhythm and normal heart sounds.  Exam reveals no gallop and no friction rub.   No murmur heard. Pulmonary/Chest: Effort normal and breath sounds normal. No respiratory  distress. He has no wheezes. He has no rales. He exhibits no tenderness.  Abdominal: Soft. Bowel sounds are normal. He exhibits distension. He exhibits no mass. There is no tenderness. There is no rebound and no guarding.  Musculoskeletal: Normal range of motion. He exhibits no edema.  Neurological: He is alert and oriented to person, place, and time.  Slow speech   Skin: Skin is warm. No rash noted. No erythema.  Psychiatric: Affect and judgment normal.      LABORATORY PANEL:   CBC  Recent Labs Lab 11/24/16 0136  WBC 7.1  HGB 9.3*  HCT 30.2*  PLT 113*   ------------------------------------------------------------------------------------------------------------------  Chemistries   Recent Labs Lab 11/24/16 0136  NA 139  K 3.9  CL 102  CO2 33*  GLUCOSE 183*  BUN 16  CREATININE 0.95  CALCIUM 8.8*  MG 1.9  AST 47*  ALT 35  ALKPHOS 101  BILITOT 0.7   ------------------------------------------------------------------------------------------------------------------  Cardiac Enzymes No results for input(s): TROPONINI in the last 168 hours. ------------------------------------------------------------------------------------------------------------------  RADIOLOGY:  No results found.   ASSESSMENT AND PLAN:    36 year old male with EtOH related liver cirrhosis with esophageal varices, schizophrenia and diabetes who presents with confusion.  1. Hepatic encephalopathy with elevated ammonia level: Patient still with some very mild confusion Continue Xifaxan and lactulose Ammonia level for a.m.   2. Liver cirrhosis due to EtOH and EV with banding: Continue PPI, Carafate Lasix, nadolol, Aldactone  3. Essential hypertension: Continue Cozaar  4. Diabetes: Continue sliding scale insulin  5. Schizophrenia: Continue Zyprexa  6. Sleep apnea : BiPAP at night  Management plans discussed with the patient and he is in agreement.  CODE STATUS: full  TOTAL TIME  TAKING CARE OF THIS PATIENT: 30 minutes.     POSSIBLE D/C tomorrow, DEPENDING ON CLINICAL CONDITION.   Gibson Lad M.D on 11/24/2016 at 10:46 AM  Between 7am to 6pm - Pager - [phone] After 6pm go to [email or web address] - password [employer]  [facility]  Office  [phone]  CC: Primary care physician; Marisue Ivan, MD  Note: This dictation was prepared with Dragon dictation along with smaller phrase technology. Any transcriptional errors that result from this process are unintentional.

## 2016-11-24 NOTE — Evaluation (Signed)
Physical Therapy Evaluation Patient Details Name: Howard [name] MRN: [id number] DOB: Dec 05, 1980 Today's Date: 11/24/2016   History of Present Illness  Pt is a 36 y.o. male with a known history of Alcoholic cirrhosis of liver, chronic anemia, COPD, depression, diabetes mellitus type 2, esophageal varices, hypertension, total gastropathy, schizophrenia presented to the emergency room with dizziness and confusion. Patient says he has been lethargic and confused for the last 2 days and he also has noticed vomiting on Saturday. He drank 4 beers on Saturday and later on felt nauseous and had an episode of vomiting. Patient is yes she has been compliant with his medication for liver disease. He was evaluated in the emergency room and his ammonia level was elevated. Pt admitted with diagnoses that include acute hepatic encephalopathy and decompensated liver disease. Pt is on 2LO2/min at home.   Clinical Impression  Pt presents with mild deficits in strength, transfers, gait, balance, and activity tolerance.  Pt was Mod I with bed mobility tasks with need of bed rail and extra time and effort during sup to/from sit.  Pt stood from sitting with SBA with good stability but required EOB to be elevated.  Pt able to amb 30' with SBA and RW.  Pt steady with gait with no LOB but fatigued quickly and required to return to sitting with SpO2 on 2LO2/min decreasing from 93% at baseline to 91% with minimal SOB. Pt will benefit from PT services to address above deficits for decreased caregiver assistance upon discharge.      Follow Up Recommendations Home health PT    Equipment Recommendations  None recommended by PT    Recommendations for Other Services       Precautions / Restrictions Precautions Precautions: None Restrictions Weight Bearing Restrictions: No      Mobility  Bed Mobility Overal bed mobility: Modified Independent             General bed mobility comments: Extra time and effort and use  of bed rail required  Transfers Overall transfer level: Needs assistance Equipment used: Rolling walker (2 wheeled) Transfers: Sit to/from Stand Sit to Stand: Supervision            Ambulation/Gait Ambulation/Gait assistance: Supervision Ambulation Distance (Feet): 30 Feet Assistive device: Rolling walker (2 wheeled) Gait Pattern/deviations: Decreased step length - left;Decreased step length - right     General Gait Details: Pt steady with gait with no LOB but fatigued quickly and required to return to sitting with SpO2 on 2LO2/min decreasing from 93% at baseline to 91% with minimal SOB.   Stairs Stairs:  (deferred)          Wheelchair Mobility    Modified Rankin (Stroke Patients Only)       Balance Overall balance assessment: Needs assistance Sitting-balance support: No upper extremity supported Sitting balance-Leahy Scale: Normal     Standing balance support: No upper extremity supported Standing balance-Leahy Scale: Good                               Pertinent Vitals/Pain Pain Assessment: No/denies pain    Home Living Family/patient expects to be discharged to:: Private residence Living Arrangements: Other relatives;Parent (Pt lives with brother and mother) Available Help at Discharge: Family;Available 24 hours/day Type of Home: House Home Access: Stairs to enter Entrance Stairs-Rails: Right;Left (Can not reach both per pt) Entrance Stairs-Number of Steps: 3 Home Layout: One level Home Equipment: Walker - 2 wheels;Wheelchair -  power      Prior Function Level of Independence: Independent with assistive device(s)         Comments: Pt Mod I with amb with use of QC; pt reports 4-5 falls in the last year secondary to dizziness     Hand Dominance   Dominant Hand: Right    Extremity/Trunk Assessment        Lower Extremity Assessment Lower Extremity Assessment: Generalized weakness       Communication   Communication: No  difficulties  Cognition Arousal/Alertness: Awake/alert Behavior During Therapy: WFL for tasks assessed/performed Overall Cognitive Status: Within Functional Limits for tasks assessed                      General Comments      Exercises Total Joint Exercises Ankle Circles/Pumps: Strengthening;Both;10 reps Towel Squeeze: Strengthening;Both;10 reps Heel Slides: AROM;Both;10 reps Long Arc Quad: AROM;Both;10 reps Knee Flexion: AROM;Both;10 reps Marching in Standing: AROM;Both;10 reps Other Exercises Other Exercises: Seated hip flex to BLEs x 10   Assessment/Plan    PT Assessment Patient needs continued PT services  PT Problem List Decreased strength;Decreased activity tolerance;Decreased balance       PT Treatment Interventions Gait training;Stair training;Neuromuscular re-education;Balance training;Therapeutic exercise;Therapeutic activities;Patient/family education    PT Goals (Current goals can be found in the Care Plan section)  Acute Rehab PT Goals Patient Stated Goal: "I wish I were stronger" PT Goal Formulation: With patient Time For Goal Achievement: 12/07/16 Potential to Achieve Goals: Good    Frequency Min 2X/week   Barriers to discharge        Co-evaluation               End of Session Equipment Utilized During Treatment: Gait belt;Oxygen Activity Tolerance: Patient limited by fatigue Patient left: in chair;with call bell/phone within reach;with family/visitor present   PT Visit Diagnosis: Muscle weakness (generalized) (M62.81);Difficulty in walking, not elsewhere classified (R26.2)         Time: [id number] PT Time Calculation (min) (ACUTE ONLY): 34 min   Charges:   PT Evaluation $PT Eval Low Complexity: 1 Procedure PT Treatments $Therapeutic Exercise: 8-22 mins   PT G Codes:         Elly Modena. Scott Haani Bakula PT, DPT 11/24/16, 1:24 PM

## 2016-11-24 NOTE — Progress Notes (Signed)
Asked pt about wearing bipap tonight, he stated that he has machine at home but does not wear it. He said he did not wont to wear tonight. Will pass on to night shift therapist. Will continue to monitor.

## 2016-11-24 NOTE — H&P (Signed)
[facility] [place] [facility]   PATIENT NAME: Howard [name]    MR#:  [id number]  DATE OF BIRTH:  09-20-1981  DATE OF ADMISSION:  11/23/2016  PRIMARY CARE PHYSICIAN: Marisue IvanLINTHAVONG, [name], MD   REQUESTING/REFERRING PHYSICIAN:   CHIEF COMPLAINT:   Chief Complaint  Patient presents with  . Dizziness    HISTORY OF PRESENT ILLNESS: Howard BowlerJoey Reppond  is a 36 y.o. male with a known history of Alcoholic cirrhosis of liver, chronic anemia, COPD, depression, diabetes mellitus type 2, esophageal varices, hypertension, total gastropathy, schizophrenia presented to the emergency room with dizziness and confusion. Patient says he has been lethargic and confused for the last 2 days and he also has noticed vomiting on Saturday. He drank 4 beers on Saturday and later on felt nauseous and had an episode of vomiting. Patient is yes she has been compliant with his medication for liver disease. He was evaluated in the emergency room and his ammonia level was elevated. No history of any fall. Last bowel movement was around 3 PM yesterday afternoon. Patient says he forgets more easy whenever he stands up. He shouldn't is awake, alert and oriented to time place and person responds to all verbal commands.  PAST MEDICAL HISTORY:   Past Medical History:  Diagnosis Date  . Alcoholic cirrhosis of liver with ascites (HCC)   . Anemia   . COPD (chronic obstructive pulmonary disease) (HCC)   . Depression   . Diabetes (HCC)   . Diabetes mellitus, type II (HCC)   . Esophageal varices (HCC)   . Heart disease   . Hyperlipemia   . Hypertension   . Liver disease   . Multiple thyroid nodules   . Portal hypertensive gastropathy   . Schizophrenia (HCC)     PAST SURGICAL HISTORY: Past Surgical History:  Procedure Laterality Date  . ESOPHAGOGASTRODUODENOSCOPY N/A 10/05/2015   Procedure: ESOPHAGOGASTRODUODENOSCOPY (EGD);  Surgeon: Elnita MaxwellMatthew Gordon Rein, MD;  [facility];  Service:  Endoscopy;  Laterality: N/A;  . ESOPHAGOGASTRODUODENOSCOPY (EGD) WITH PROPOFOL N/A 08/03/2015   Procedure: ESOPHAGOGASTRODUODENOSCOPY (EGD) WITH PROPOFOL;  Surgeon: Elnita MaxwellMatthew Gordon Rein, MD;  [facility];  Service: Endoscopy;  Laterality: N/A;  . ESOPHAGOGASTRODUODENOSCOPY (EGD) WITH PROPOFOL N/A 08/31/2015   Procedure: ESOPHAGOGASTRODUODENOSCOPY (EGD) WITH PROPOFOL;  Surgeon: Elnita MaxwellMatthew Gordon Rein, MD;  [facility];  Service: Endoscopy;  Laterality: N/A;  . ESOPHAGOGASTRODUODENOSCOPY (EGD) WITH PROPOFOL N/A 04/04/2016   Procedure: ESOPHAGOGASTRODUODENOSCOPY (EGD) WITH PROPOFOL;  Surgeon: Scot Junobert T Elliott, MD;  [facility];  Service: Endoscopy;  Laterality: N/A;  . ESOPHAGOGASTRODUODENOSCOPY (EGD) WITH PROPOFOL N/A 11/13/2016   Procedure: ESOPHAGOGASTRODUODENOSCOPY (EGD) WITH PROPOFOL;  Surgeon: Wyline MoodKiran Anna, MD;  Location: [facility] ENDOSCOPY;  Service: Endoscopy;  Laterality: N/A;  . NO PAST SURGERIES      SOCIAL HISTORY:  Social History  Substance Use Topics  . Smoking status: Never Smoker  . Smokeless tobacco: Never Used  . Alcohol use 30.0 oz/week    50 Cans of beer per week     Comment: last drink monday 10/13/16    FAMILY HISTORY:  Family History  Problem Relation Age of Onset  . Heart disease Mother   . Hypertension Mother   . Hyperlipidemia Mother   . Stroke Father   . Heart attack Father   . Hypertension Father   . Heart disease Father   . Alcohol abuse Father   . Heart disease Brother     DRUG ALLERGIES: No Known Allergies  REVIEW OF SYSTEMS:  CONSTITUTIONAL: No fever, has weakness.  EYES: No blurred or double vision.  EARS, NOSE, AND THROAT: No tinnitus or ear pain.  RESPIRATORY: No cough, shortness of breath, wheezing or hemoptysis.  CARDIOVASCULAR: No chest pain, orthopnea, edema.  GASTROINTESTINAL: Had nausea, vomiting, no diarrhea or abdominal pain.  GENITOURINARY: No dysuria, hematuria.  ENDOCRINE: No polyuria, nocturia,   HEMATOLOGY: Has anemia, no easy bruising or bleeding SKIN: No rash or lesion. MUSCULOSKELETAL: No joint pain or arthritis.   NEUROLOGIC: No tingling, numbness, weakness.  PSYCHIATRY: No anxiety or depression.   MEDICATIONS AT HOME:  Prior to Admission medications   Medication Sig Start Date End Date Taking? Authorizing Provider  acamprosate (CAMPRAL) 333 MG tablet Take 2 tablets (666 mg total) by mouth 3 (three) times daily with meals. 08/19/16  Yes Audery Amel, MD  albuterol (PROVENTIL HFA;VENTOLIN HFA) 108 (90 Base) MCG/ACT inhaler Inhale 2 puffs into the lungs every 6 (six) hours as needed for wheezing or shortness of breath. 10/08/16  Yes Shaune Pollack, MD  citalopram (CELEXA) 20 MG tablet Take 1 tablet (20 mg total) by mouth every morning. 08/19/16  Yes Audery Amel, MD  furosemide (LASIX) 20 MG tablet Take 1 tablet (20 mg total) by mouth daily. 05/15/16  Yes Richard Renae Gloss, MD  lactulose (CHRONULAC) 10 GM/15ML solution Take 45 mLs (30 g total) by mouth every 4 (four) hours. 07/09/16  Yes Katharina Caper, MD  levETIRAcetam (KEPPRA) 1000 MG tablet Take 1 tablet (1,000 mg total) by mouth 2 (two) times daily. Patient taking differently: Take 750 mg by mouth 2 (two) times daily.  07/09/16  Yes Katharina Caper, MD  losartan (COZAAR) 25 MG tablet Take 25 mg by mouth daily.   Yes Historical Provider, MD  metFORMIN (GLUCOPHAGE) 500 MG tablet Take 1 tablet (500 mg total) by mouth 2 (two) times daily with a meal. 05/15/16  Yes Alford Highland, MD  modafinil (PROVIGIL) 100 MG tablet Take 1 tablet (100 mg total) by mouth daily. 08/13/16  Yes Auburn Bilberry, MD  Multiple Vitamin (MULTIVITAMIN WITH MINERALS) TABS tablet Take 1 tablet by mouth daily. 08/04/15  Yes Ramonita Lab, MD  nadolol (CORGARD) 20 MG tablet Take 20 mg by mouth daily.   Yes Historical Provider, MD  OLANZapine (ZYPREXA) 15 MG tablet Take 2 tablets (30 mg total) by mouth at bedtime. 05/22/16  Yes Audery Amel, MD  ondansetron (ZOFRAN) 4  MG tablet Take 1 tablet (4 mg total) by mouth every 8 (eight) hours as needed for nausea or vomiting. 08/26/16  Yes Governor Rooks, MD  pantoprazole (PROTONIX) 40 MG tablet Take 1 tablet (40 mg total) by mouth 2 (two) times daily before a meal. 11/14/16  Yes Altamese Dilling, MD  rifaximin (XIFAXAN) 550 MG TABS tablet Take 1 tablet (550 mg total) by mouth 2 (two) times daily. 07/09/16  Yes Katharina Caper, MD  senna-docusate (SENOKOT-S) 8.6-50 MG tablet Take 2 tablets by mouth at bedtime as needed for mild constipation. 11/14/16  Yes Altamese Dilling, MD  spironolactone (ALDACTONE) 50 MG tablet Take 1 tablet (50 mg total) by mouth 2 (two) times daily. 07/09/16  Yes Katharina Caper, MD  sucralfate (CARAFATE) 1 g tablet Take 1 g by mouth 4 (four) times daily.   Yes Historical Provider, MD      PHYSICAL EXAMINATION:   VITAL SIGNS: Blood pressure 140/80, pulse 83, temperature 98.1 F (36.7 C), temperature source Oral, resp. rate 20, height 6\' 3"  (1.905 m), weight (!) 163.7 kg (361 lb), SpO2 95 %.  GENERAL:  36 y.o.-year-old patient lying in the bed with no acute distress.  EYES: Pupils equal, round, reactive to light and accommodation. No scleral icterus. Extraocular muscles intact.  HEENT: Head atraumatic, normocephalic. Oropharynx and nasopharynx clear.  NECK:  Supple, no jugular venous distention. No thyroid enlargement, no tenderness.  LUNGS: Normal breath sounds bilaterally, no wheezing, rales,rhonchi or crepitation. No use of accessory muscles of respiration.  CARDIOVASCULAR: S1, S2 normal. No murmurs, rubs, or gallops.  ABDOMEN: Soft, nontender, nondistended. Bowel sounds present. No organomegaly or mass.  EXTREMITIES: No pedal edema, cyanosis, or clubbing.  NEUROLOGIC: Cranial nerves II through XII are intact. Muscle strength 5/5 in all extremities. Sensation intact. Gait not checked.  PSYCHIATRIC: The patient is alert and oriented x 3.  SKIN: No obvious rash, lesion, or ulcer.    LABORATORY PANEL:   CBC  Recent Labs Lab 11/23/16 1941 11/24/16 0136  WBC 6.2 7.1  HGB 9.7* 9.3*  HCT 31.4* 30.2*  PLT 122* 113*  MCV 80.2 79.4*  MCH 24.7* 24.6*  MCHC 30.8* 30.9*  RDW 21.9* 21.7*   ------------------------------------------------------------------------------------------------------------------  Chemistries   Recent Labs Lab 11/23/16 1941  NA 138  K 4.3  CL 101  CO2 30  GLUCOSE 330*  BUN 17  CREATININE 0.93  CALCIUM 8.8*   ------------------------------------------------------------------------------------------------------------------ estimated creatinine clearance is 178.7 mL/min (by C-G formula based on SCr of 0.93 mg/dL). ------------------------------------------------------------------------------------------------------------------ No results for input(s): TSH, T4TOTAL, T3FREE, THYROIDAB in the last 72 hours.  Invalid input(s): FREET3   Coagulation profile No results for input(s): INR, PROTIME in the last 168 hours. ------------------------------------------------------------------------------------------------------------------- No results for input(s): DDIMER in the last 72 hours. -------------------------------------------------------------------------------------------------------------------  Cardiac Enzymes No results for input(s): CKMB, TROPONINI, MYOGLOBIN in the last 168 hours.  Invalid input(s): CK ------------------------------------------------------------------------------------------------------------------ Invalid input(s): POCBNP  ---------------------------------------------------------------------------------------------------------------  Urinalysis    Component Value Date/Time   COLORURINE YELLOW (A) 11/23/2016 1941   APPEARANCEUR CLEAR (A) 11/23/2016 1941   APPEARANCEUR Clear 12/25/2014 1404   LABSPEC 1.012 11/23/2016 1941   LABSPEC 1.016 12/25/2014 1404   PHURINE 6.0 11/23/2016 1941   GLUCOSEU >=500  (A) 11/23/2016 1941   GLUCOSEU Negative 12/25/2014 1404   HGBUR LARGE (A) 11/23/2016 1941   BILIRUBINUR NEGATIVE 11/23/2016 1941   BILIRUBINUR Negative 12/25/2014 1404   KETONESUR NEGATIVE 11/23/2016 1941   PROTEINUR NEGATIVE 11/23/2016 1941   NITRITE NEGATIVE 11/23/2016 1941   LEUKOCYTESUR NEGATIVE 11/23/2016 1941   LEUKOCYTESUR Negative 12/25/2014 1404     RADIOLOGY: No results found.  EKG: Orders placed or performed during the hospital encounter of 11/23/16  . ED EKG  . ED EKG  . EKG 12-Lead    IMPRESSION AND PLAN: 36 year old male patient with history of cirrhosis of liver, alcoholic liver disease, diabetes mellitus type 2, esophageal varices, schizophrenia, portal gastropathy presented to the emergency room with lethargy and confusion. His ammonia level was elevated. Admitting diagnosis 1. Acute hepatic encephalopathy 2. Decompensated liver disease 3. Cirrhosis of liver 4. Diabetes mellitus type 2 5. Portal gastropathy Treatment plan Admit patient to medical floor Start patient on oral lactulose Follow-up hormone level Resume oral Xifaxan, continue Lasix for diuresis Monitor renal function DVT prophylaxis subcutaneous heparin Resume home medications Supportive care.  All the records are reviewed and case discussed with ED provider. Management plans discussed with the patient, family and they are in agreement.  CODE STATUS:FULL CODE    Code Status Orders        Start     Ordered   11/24/16 0112  Full  code  Continuous     11/24/16 0111    Code Status History    Date Active Date Inactive Code Status Order ID Comments User Context   11/12/2016  3:36 PM 11/14/2016  2:36 PM Full Code [id number]  Auburn Bilberry, MD Inpatient   10/04/2016  6:04 PM 10/08/2016  3:44 PM Full Code [id number]  Shaune Pollack, MD Inpatient   09/22/2016  2:29 AM 09/23/2016  6:38 PM Full Code [id number]  Oralia Manis, MD Inpatient   08/07/2016  7:37 PM 08/12/2016  8:51 PM Full Code [id number]  Gracelyn Nurse, MD Inpatient   07/07/2016  1:40 AM 07/09/2016  6:29 PM Full Code [id number]  Ihor Austin, MD Inpatient   06/10/2016  6:21 AM 06/11/2016  8:25 PM Full Code [id number]  Ihor Austin, MD Inpatient   05/12/2016  3:55 AM 05/15/2016  2:57 PM Full Code [id number]  Ihor Austin, MD Inpatient   04/28/2016  2:54 AM 04/29/2016  6:23 PM Full Code [id number]  Tonye Royalty, DO Inpatient   03/14/2016 12:30 PM 03/16/2016  3:41 PM Full Code [id number]  Shaune Pollack, MD Inpatient   03/03/2016 12:13 PM 03/06/2016  2:26 PM Full Code [id number]  Milagros Loll, MD ED   02/10/2016  5:54 PM 02/12/2016  7:34 PM Full Code [id number]  Ramonita Lab, MD Inpatient   11/11/2015  5:16 AM 11/13/2015  6:00 PM Full Code [id number]  Ihor Austin, MD Inpatient   11/07/2015 10:02 PM 11/09/2015  4:56 PM Full Code [id number]  Enid Baas, MD Inpatient   10/28/2015  8:14 PM 10/29/2015  2:26 PM Full Code [id number]  Altamese Dilling, MD Inpatient   10/16/2015 11:47 PM 10/17/2015  6:00 PM Full Code [id number]  Oralia Manis, MD Inpatient   07/30/2015  5:35 PM 08/04/2015  8:34 PM Full Code [id number]  Enedina Finner, MD Inpatient   07/05/2015  1:16 AM 07/08/2015  7:22 PM Full Code [id number]  Oralia Manis, MD Inpatient   05/30/2015  7:28 PM 06/01/2015  3:36 PM Full Code [id number]  Audery Amel, MD Inpatient       TOTAL TIME TAKING CARE OF THIS PATIENT: 50 minutes.    Ihor Austin M.D on 11/24/2016 at 2:17 AM  Between 7am to 6pm - Pager - [phone]  After 6pm go to [email or web address] - password [email or web address] [facility]  [place] [facility]  Office  [phone]  CC: Primary care physician; Marisue Ivan, MD

## 2016-11-25 LAB — GLUCOSE, CAPILLARY
GLUCOSE-CAPILLARY: 101 mg/dL — AB (ref 65–99)
Glucose-Capillary: 149 mg/dL — ABNORMAL HIGH (ref 65–99)

## 2016-11-25 LAB — AMMONIA: Ammonia: 51 umol/L — ABNORMAL HIGH (ref 9–35)

## 2016-11-25 LAB — HIV ANTIBODY (ROUTINE TESTING W REFLEX): HIV SCREEN 4TH GENERATION: NONREACTIVE

## 2016-11-25 MED ORDER — LEVETIRACETAM 1000 MG PO TABS: 750.0000 mg | ORAL_TABLET | Freq: Two times a day (BID) | ORAL | 0 refills | Status: DC

## 2016-11-25 NOTE — Care Management Important Message (Signed)
Important Message  Patient Details  Name: Howard [name] MRN: [id number] Date of Birth: January 18, 1981   Medicare Important Message Given:  Yes    Gwenette GreetBrenda S Cebastian Neis, RN 11/25/2016, 7:59 AM

## 2016-11-25 NOTE — Discharge Summary (Signed)
[employer] [facility] [facility] [place] at [facility]   PATIENT NAME: Howard [name]    MR#:  [id number]  DATE OF BIRTH:  Oct 15, 1980  DATE OF ADMISSION:  11/23/2016 ADMITTING PHYSICIAN: No admitting provider for patient encounter.  DATE OF DISCHARGE: 11/25/2016  PRIMARY CARE PHYSICIAN: Marisue Ivan, MD    ADMISSION DIAGNOSIS:  Hepatic encephalopathy (HCC) [K72.90]  DISCHARGE DIAGNOSIS:  Active Problems:   Hepatic encephalopathy (HCC)   SECONDARY DIAGNOSIS:   Past Medical History:  Diagnosis Date  . Alcoholic cirrhosis of liver with ascites (HCC)   . Anemia   . COPD (chronic obstructive pulmonary disease) (HCC)   . Depression   . Diabetes (HCC)   . Diabetes mellitus, type II (HCC)   . Esophageal varices (HCC)   . Heart disease   . Hyperlipemia   . Hypertension   . Liver disease   . Multiple thyroid nodules   . Portal hypertensive gastropathy   . Schizophrenia [facility])     HOSPITAL COURSE:   36 year old male with EtOH related liver cirrhosis with esophageal varices, schizophrenia and diabetes who presents with confusion.  1. Hepatic encephalopathy: Ammonia level and symptoms have improved He will Continue Xifaxan and lactulose   2. Liver cirrhosis due to EtOH and EV with banding: Continue PPI, Carafate Lasix, nadolol, Aldactone  3. Essential hypertension: Continue Cozaar  4. Diabetes: Continue Metformin and ADA diet 5. Schizophrenia: Continue Zyprexa  6. Sleep apnea : He is not compliant withBiPAP at night   DISCHARGE CONDITIONS AND DIET:   Stable for discharge on heart healthy diet and diabetic diet  CONSULTS OBTAINED:    DRUG ALLERGIES:  No Known Allergies  DISCHARGE MEDICATIONS:   Current Discharge Medication List    CONTINUE these medications which have CHANGED   Details  levETIRAcetam (KEPPRA) 1000 MG tablet Take 1 tablet (1,000 mg total) by mouth 2 (two) times daily. Qty: 60 tablet, Refills: 0      CONTINUE these medications which  have NOT CHANGED   Details  acamprosate (CAMPRAL) 333 MG tablet Take 2 tablets (666 mg total) by mouth 3 (three) times daily with meals. Qty: 180 tablet, Refills: 5    albuterol (PROVENTIL HFA;VENTOLIN HFA) 108 (90 Base) MCG/ACT inhaler Inhale 2 puffs into the lungs every 6 (six) hours as needed for wheezing or shortness of breath. Qty: 1 Inhaler, Refills: 2    citalopram (CELEXA) 20 MG tablet Take 1 tablet (20 mg total) by mouth every morning. Qty: 30 tablet, Refills: 5    furosemide (LASIX) 20 MG tablet Take 1 tablet (20 mg total) by mouth daily. Qty: 30 tablet, Refills: 0    lactulose (CHRONULAC) 10 GM/15ML solution Take 45 mLs (30 g total) by mouth every 4 (four) hours. Qty: 240 mL, Refills: 0    losartan (COZAAR) 25 MG tablet Take 25 mg by mouth daily.    metFORMIN (GLUCOPHAGE) 500 MG tablet Take 1 tablet (500 mg total) by mouth 2 (two) times daily with a meal. Qty: 60 tablet, Refills: 0    modafinil (PROVIGIL) 100 MG tablet Take 1 tablet (100 mg total) by mouth daily. Qty: 30 tablet, Refills: 0    Multiple Vitamin (MULTIVITAMIN WITH MINERALS) TABS tablet Take 1 tablet by mouth daily. Qty: 30 tablet, Refills: 0    nadolol (CORGARD) 20 MG tablet Take 20 mg by mouth daily.    OLANZapine (ZYPREXA) 15 MG tablet Take 2 tablets (30 mg total) by mouth at bedtime. Qty: 60 tablet, Refills: 5    ondansetron (  ZOFRAN) 4 MG tablet Take 1 tablet (4 mg total) by mouth every 8 (eight) hours as needed for nausea or vomiting. Qty: 10 tablet, Refills: 0    pantoprazole (PROTONIX) 40 MG tablet Take 1 tablet (40 mg total) by mouth 2 (two) times daily before a meal. Qty: 60 tablet, Refills: 1    rifaximin (XIFAXAN) 550 MG TABS tablet Take 1 tablet (550 mg total) by mouth 2 (two) times daily. Qty: 60 tablet, Refills: 5    senna-docusate (SENOKOT-S) 8.6-50 MG tablet Take 2 tablets by mouth at bedtime as needed for mild constipation. Qty: 20 tablet, Refills: 0    spironolactone (ALDACTONE)  50 MG tablet Take 1 tablet (50 mg total) by mouth 2 (two) times daily. Qty: 60 tablet, Refills: 5    sucralfate (CARAFATE) 1 g tablet Take 1 g by mouth 4 (four) times daily.          Today   CHIEF COMPLAINT:   No acute issues overnight. Patient's mental status is at baseline   VITAL SIGNS:  Blood pressure 127/62, pulse 80, temperature 98.3 F (36.8 C), temperature source Oral, resp. rate 20, height 6\' 3"  (1.905 m), weight (!) 163.7 kg (361 lb), SpO2 96 %.   REVIEW OF SYSTEMS:  Review of Systems  Constitutional: Negative.  Negative for chills, fever and malaise/fatigue.  HENT: Negative.  Negative for ear discharge, ear pain, hearing loss, nosebleeds and sore throat.   Eyes: Negative.  Negative for blurred vision and pain.  Respiratory: Negative.  Negative for cough, hemoptysis, shortness of breath and wheezing.   Cardiovascular: Negative for chest pain and palpitations.  Gastrointestinal: Negative.  Negative for abdominal pain, blood in stool, diarrhea, nausea and vomiting.  Genitourinary: Negative.  Negative for dysuria.  Musculoskeletal: Negative.  Negative for back pain.  Skin: Negative.   Neurological: Negative for dizziness, tremors, speech change, focal weakness, seizures and headaches.  Endo/Heme/Allergies: Negative.  Does not bruise/bleed easily.  Psychiatric/Behavioral: Negative.  Negative for depression, hallucinations and suicidal ideas.     PHYSICAL EXAMINATION:  GENERAL:  36 y.o.-year-old patient lying in the bed with no acute distress.  NECK:  Supple, no jugular venous distention. No thyroid enlargement, no tenderness.  LUNGS: Normal breath sounds bilaterally, no wheezing, rales,rhonchi  No use of accessory muscles of respiration.  CARDIOVASCULAR: S1, S2 normal. No murmurs, rubs, or gallops.  ABDOMEN: Soft, non-tender, non-distended. Bowel sounds present. No organomegaly or mass.  EXTREMITIES: 2+ pedal edema, NOcyanosis, or clubbing.  PSYCHIATRIC: The  patient is alert and oriented x 3.  SKIN: No obvious rash, lesion, or ulcer.   DATA REVIEW:   CBC  Recent Labs Lab 11/24/16 0136  WBC 7.1  HGB 9.3*  HCT 30.2*  PLT 113*    Chemistries   Recent Labs Lab 11/24/16 0136  NA 139  K 3.9  CL 102  CO2 33*  GLUCOSE 183*  BUN 16  CREATININE 0.95  CALCIUM 8.8*  MG 1.9  AST 47*  ALT 35  ALKPHOS 101  BILITOT 0.7    Cardiac Enzymes No results for input(s): TROPONINI in the last 168 hours.  Microbiology Results  @MICRORSLT48 @  RADIOLOGY:  No results found.    Current Discharge Medication List    CONTINUE these medications which have CHANGED   Details  levETIRAcetam (KEPPRA) 1000 MG tablet Take 1 tablet (1,000 mg total) by mouth 2 (two) times daily. Qty: 60 tablet, Refills: 0      CONTINUE these medications which have NOT CHANGED  Details  acamprosate (CAMPRAL) 333 MG tablet Take 2 tablets (666 mg total) by mouth 3 (three) times daily with meals. Qty: 180 tablet, Refills: 5    albuterol (PROVENTIL HFA;VENTOLIN HFA) 108 (90 Base) MCG/ACT inhaler Inhale 2 puffs into the lungs every 6 (six) hours as needed for wheezing or shortness of breath. Qty: 1 Inhaler, Refills: 2    citalopram (CELEXA) 20 MG tablet Take 1 tablet (20 mg total) by mouth every morning. Qty: 30 tablet, Refills: 5    furosemide (LASIX) 20 MG tablet Take 1 tablet (20 mg total) by mouth daily. Qty: 30 tablet, Refills: 0    lactulose (CHRONULAC) 10 GM/15ML solution Take 45 mLs (30 g total) by mouth every 4 (four) hours. Qty: 240 mL, Refills: 0    losartan (COZAAR) 25 MG tablet Take 25 mg by mouth daily.    metFORMIN (GLUCOPHAGE) 500 MG tablet Take 1 tablet (500 mg total) by mouth 2 (two) times daily with a meal. Qty: 60 tablet, Refills: 0    modafinil (PROVIGIL) 100 MG tablet Take 1 tablet (100 mg total) by mouth daily. Qty: 30 tablet, Refills: 0    Multiple Vitamin (MULTIVITAMIN WITH MINERALS) TABS tablet Take 1 tablet by mouth  daily. Qty: 30 tablet, Refills: 0    nadolol (CORGARD) 20 MG tablet Take 20 mg by mouth daily.    OLANZapine (ZYPREXA) 15 MG tablet Take 2 tablets (30 mg total) by mouth at bedtime. Qty: 60 tablet, Refills: 5    ondansetron (ZOFRAN) 4 MG tablet Take 1 tablet (4 mg total) by mouth every 8 (eight) hours as needed for nausea or vomiting. Qty: 10 tablet, Refills: 0    pantoprazole (PROTONIX) 40 MG tablet Take 1 tablet (40 mg total) by mouth 2 (two) times daily before a meal. Qty: 60 tablet, Refills: 1    rifaximin (XIFAXAN) 550 MG TABS tablet Take 1 tablet (550 mg total) by mouth 2 (two) times daily. Qty: 60 tablet, Refills: 5    senna-docusate (SENOKOT-S) 8.6-50 MG tablet Take 2 tablets by mouth at bedtime as needed for mild constipation. Qty: 20 tablet, Refills: 0    spironolactone (ALDACTONE) 50 MG tablet Take 1 tablet (50 mg total) by mouth 2 (two) times daily. Qty: 60 tablet, Refills: 5    sucralfate (CARAFATE) 1 g tablet Take 1 g by mouth 4 (four) times daily.          Management plans discussed with the patient and he is in agreement. Stable for discharge   Patient should follow up with pcp  CODE STATUS:     Code Status Orders        Start     Ordered   11/24/16 0112  Full code  Continuous     11/24/16 0111    Code Status History    Date Active Date Inactive Code Status Order ID Comments User Context   11/12/2016  3:36 PM 11/14/2016  2:36 PM Full Code [id number]  Auburn Bilberry, MD Inpatient   10/04/2016  6:04 PM 10/08/2016  3:44 PM Full Code [id number]  Shaune Pollack, MD Inpatient   09/22/2016  2:29 AM 09/23/2016  6:38 PM Full Code [id number]  Oralia Manis, MD Inpatient   08/07/2016  7:37 PM 08/12/2016  8:51 PM Full Code [id number]  Gracelyn Nurse, MD Inpatient   07/07/2016  1:40 AM 07/09/2016  6:29 PM Full Code [id number]  Ihor Austin, MD Inpatient   06/10/2016  6:21 AM 06/11/2016  8:25 PM Full Code [id number]  Ihor Austin, MD Inpatient   05/12/2016  3:55 AM 05/15/2016  2:57  PM Full Code [id number]  Ihor Austin, MD Inpatient   04/28/2016  2:54 AM 04/29/2016  6:23 PM Full Code [id number]  Tonye Royalty, DO Inpatient   03/14/2016 12:30 PM 03/16/2016  3:41 PM Full Code [id number]  Shaune Pollack, MD Inpatient   03/03/2016 12:13 PM 03/06/2016  2:26 PM Full Code [id number]  Milagros Loll, MD ED   02/10/2016  5:54 PM 02/12/2016  7:34 PM Full Code [id number]  Ramonita Lab, MD Inpatient   11/11/2015  5:16 AM 11/13/2015  6:00 PM Full Code [id number]  Ihor Austin, MD Inpatient   11/07/2015 10:02 PM 11/09/2015  4:56 PM Full Code [id number]  Enid Baas, MD Inpatient   10/28/2015  8:14 PM 10/29/2015  2:26 PM Full Code [id number]  Altamese Dilling, MD Inpatient   10/16/2015 11:47 PM 10/17/2015  6:00 PM Full Code [id number]  Oralia Manis, MD Inpatient   07/30/2015  5:35 PM 08/04/2015  8:34 PM Full Code [id number]  Enedina Finner, MD Inpatient   07/05/2015  1:16 AM 07/08/2015  7:22 PM Full Code [id number]  Oralia Manis, MD Inpatient   05/30/2015  7:28 PM 06/01/2015  3:36 PM Full Code [id number]  Audery Amel, MD Inpatient      TOTAL TIME TAKING CARE OF THIS PATIENT: 37 minutes.    Note: This dictation was prepared with Dragon dictation along with smaller phrase technology. Any transcriptional errors that result from this process are unintentional.  Kahlen Boyde M.D on 11/25/2016 at 10:32 AM  Between 7am to 6pm - Pager - [phone] After 6pm go to [email or web address] - Social research officer, government  [facility]  Office  [phone]  CC: Primary care physician; Marisue Ivan, MD

## 2016-11-25 NOTE — Progress Notes (Signed)
Discharge Instructions reviewed with patient and patient's Mom with Patient's permission. Patient verbalized understanding. Patient reminded that [facility] will follow up with him regarding Oxygen condenser per Ms. Steward DroneBrenda, CM- patient providing updated phone number to be contacted in the home ([phone]). Patient left the unit with cell phone, clothing and personal walker. Patient to be transported home via private vehicle by Mother. Patient escorted to entrance via wheelchair by Nurse Tech.

## 2016-11-25 NOTE — Care Management (Signed)
Discharge to home today per Dr. Juliene PinaMody. Physical therapy evaluation completed. Recommending home with home health and physical therapy. [facility] is unable to take [name] at this time. Discussed with [name]. Chose [facility]. Will update Howard [name], [facility] representative.  States he is out of oxygen in the home. Reminded [name] that he has a concentrator at home that he could fill his tank. Gwenette GreetBrenda S Zyhir Cappella RN MSN CCM Care Management

## 2016-12-06 ENCOUNTER — Emergency Department
Admission: EM | Admit: 2016-12-06 | Discharge: 2016-12-06 | Disposition: A | Discharge status: Home / Self Care | Payer: Medicare Other | Attending: Emergency Medicine | Admitting: Emergency Medicine

## 2016-12-06 ENCOUNTER — Encounter: Payer: Self-pay | Admitting: Emergency Medicine

## 2016-12-06 DIAGNOSIS — Z79899 Other long term (current) drug therapy: Secondary | ICD-10-CM | POA: Insufficient documentation

## 2016-12-06 DIAGNOSIS — J449 Chronic obstructive pulmonary disease, unspecified: Secondary | ICD-10-CM | POA: Insufficient documentation

## 2016-12-06 DIAGNOSIS — Z7984 Long term (current) use of oral hypoglycemic drugs: Secondary | ICD-10-CM | POA: Insufficient documentation

## 2016-12-06 DIAGNOSIS — I10 Essential (primary) hypertension: Secondary | ICD-10-CM | POA: Diagnosis not present

## 2016-12-06 DIAGNOSIS — G40909 Epilepsy, unspecified, not intractable, without status epilepticus: Secondary | ICD-10-CM | POA: Diagnosis not present

## 2016-12-06 DIAGNOSIS — R569 Unspecified convulsions: Secondary | ICD-10-CM | POA: Diagnosis present

## 2016-12-06 DIAGNOSIS — E119 Type 2 diabetes mellitus without complications: Secondary | ICD-10-CM | POA: Diagnosis not present

## 2016-12-06 LAB — LIPASE, BLOOD: LIPASE: 23 U/L (ref 11–51)

## 2016-12-06 LAB — COMPREHENSIVE METABOLIC PANEL
ALT: 37 U/L (ref 17–63)
AST: 48 U/L — ABNORMAL HIGH (ref 15–41)
Albumin: 3.6 g/dL (ref 3.5–5.0)
Alkaline Phosphatase: 115 U/L (ref 38–126)
Anion gap: 6 (ref 5–15)
BILIRUBIN TOTAL: 0.7 mg/dL (ref 0.3–1.2)
BUN: 19 mg/dL (ref 6–20)
CHLORIDE: 101 mmol/L (ref 101–111)
CO2: 32 mmol/L (ref 22–32)
CREATININE: 1 mg/dL (ref 0.61–1.24)
Calcium: 9.3 mg/dL (ref 8.9–10.3)
Glucose, Bld: 128 mg/dL — ABNORMAL HIGH (ref 65–99)
Potassium: 4 mmol/L (ref 3.5–5.1)
Sodium: 139 mmol/L (ref 135–145)
TOTAL PROTEIN: 8 g/dL (ref 6.5–8.1)

## 2016-12-06 LAB — URINALYSIS, COMPLETE (UACMP) WITH MICROSCOPIC
BILIRUBIN URINE: NEGATIVE
GLUCOSE, UA: NEGATIVE mg/dL
Ketones, ur: 5 mg/dL — AB
LEUKOCYTES UA: NEGATIVE
NITRITE: NEGATIVE
PH: 6 (ref 5.0–8.0)
Protein, ur: 100 mg/dL — AB
SPECIFIC GRAVITY, URINE: 1.026 (ref 1.005–1.030)

## 2016-12-06 LAB — AMMONIA: Ammonia: 78 umol/L — ABNORMAL HIGH (ref 9–35)

## 2016-12-06 LAB — CBC
HCT: 30.8 % — ABNORMAL LOW (ref 40.0–52.0)
Hemoglobin: 9.7 g/dL — ABNORMAL LOW (ref 13.0–18.0)
MCH: 25.1 pg — ABNORMAL LOW (ref 26.0–34.0)
MCHC: 31.5 g/dL — ABNORMAL LOW (ref 32.0–36.0)
MCV: 79.7 fL — AB (ref 80.0–100.0)
PLATELETS: 147 10*3/uL — AB (ref 150–440)
RBC: 3.87 MIL/uL — AB (ref 4.40–5.90)
RDW: 22.1 % — ABNORMAL HIGH (ref 11.5–14.5)
WBC: 6.6 10*3/uL (ref 3.8–10.6)

## 2016-12-06 MED ORDER — LEVETIRACETAM 750 MG PO TABS
750.0000 mg | ORAL_TABLET | Freq: Once | ORAL | Status: AC
  Administered 2016-12-06: 750 mg via ORAL
  Filled 2016-12-06: qty 1

## 2016-12-06 MED ORDER — CHLORDIAZEPOXIDE HCL 10 MG PO CAPS: ORAL_CAPSULE | ORAL | 0 refills | Status: DC

## 2016-12-06 MED ORDER — CHLORDIAZEPOXIDE HCL 25 MG PO CAPS
25.0000 mg | ORAL_CAPSULE | Freq: Once | ORAL | Status: AC
  Administered 2016-12-06: 25 mg via ORAL
  Filled 2016-12-06: qty 1

## 2016-12-06 NOTE — ED Provider Notes (Signed)
[facility] Emergency Department Provider Note  Time seen: 9:26 PM  I have reviewed the triage vital signs and the nursing notes.   HISTORY  Chief Complaint Seizures; Fall; Headache; and Abdominal Pain    HPI Howard [name] is a 36 y.o. male with a past medical history of alcoholic cirrhosis, COPD, depression, diabetes, hypertension, hyperlipidemia, schizophrenia, seizure disorder, presents the emergency department after a seizure. According to the patient's mother patient had a seizure tonight. She states the patient takes Keppra but often times will miss his medications. Patient also states he stopped drinking 4 days ago prior to which she was drinking 6-12 beers per day. Denies any prior seizures from stopping alcohol use. Patient recently started going to [employer] Currently the patient appears well, no distress. His only complaint is a burning sensation in the epigastrium.  Past Medical History:  Diagnosis Date  . Alcoholic cirrhosis of liver with ascites (HCC)   . Anemia   . COPD (chronic obstructive pulmonary disease) (HCC)   . Depression   . Diabetes (HCC)   . Diabetes mellitus, type II (HCC)   . Esophageal varices (HCC)   . Heart disease   . Hyperlipemia   . Hypertension   . Liver disease   . Multiple thyroid nodules   . Portal hypertensive gastropathy   . Schizophrenia [facility])     Patient Active Problem List   Diagnosis Date Noted  . GIB (gastrointestinal bleeding) 10/05/2016  . Ascites 10/04/2016  . DNR (do not resuscitate) discussion 08/11/2016  . Palliative care by specialist 08/11/2016  . Muscle weakness (generalized)   . Acute upper GI bleed   . GI bleed 08/07/2016  . Alcoholic cirrhosis of liver (HCC) 07/09/2016  . Acute respiratory failure with hypercapnia (HCC) 07/09/2016  . Hyperglycemia 07/09/2016  . Seizure (HCC) 06/10/2016  . Altered mental status 05/12/2016  . Headache 04/29/2016  . OSA (obstructive sleep apnea)  04/29/2016  . Anemia 04/29/2016  . Thrombocytopenia (HCC) 04/29/2016  . Coagulopathy (HCC) 04/29/2016  . Obesity 04/29/2016  . Generalized weakness 04/29/2016  . Acute hepatic encephalopathy 03/14/2016  . Controlled type 2 diabetes mellitus without complication (HCC) 01/15/2016  . Normocytic anemia 12/12/2015  . Essential (primary) hypertension 12/11/2015  . Pure hypercholesterolemia 12/11/2015  . Fever 11/11/2015  . Ascites due to alcoholic cirrhosis (HCC) 11/11/2015  . Cirrhosis of liver with ascites (HCC) 11/11/2015  . Hepatic encephalopathy (HCC) 10/16/2015  . Type 2 diabetes mellitus (HCC) 10/16/2015  . Acute renal failure (ARF) (HCC) 07/30/2015  . 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 Alcoholic cirrhosis (HCC) 07/19/2015  . Hypoxia 07/04/2015  . Elevated transaminase level 07/04/2015  . DOE (dyspnea on exertion) 07/04/2015  . Alcohol abuse 05/31/2015  . Hypertension 05/29/2015  . Paranoid schizophrenia (HCC) 03/20/2015    Past Surgical History:  Procedure Laterality Date  . ESOPHAGOGASTRODUODENOSCOPY N/A 10/05/2015   Procedure: ESOPHAGOGASTRODUODENOSCOPY (EGD);  Surgeon: Elnita Maxwell, MD;  Location: [facility] ENDOSCOPY;  Service: Endoscopy;  Laterality: N/A;  . ESOPHAGOGASTRODUODENOSCOPY (EGD) WITH PROPOFOL N/A 08/03/2015   Procedure: ESOPHAGOGASTRODUODENOSCOPY (EGD) WITH PROPOFOL;  Surgeon: Elnita Maxwell, MD;  Location: [facility] ENDOSCOPY;  Service: Endoscopy;  Laterality: N/A;  . ESOPHAGOGASTRODUODENOSCOPY (EGD) WITH PROPOFOL N/A 08/31/2015   Procedure: ESOPHAGOGASTRODUODENOSCOPY (EGD) WITH PROPOFOL;  Surgeon: Elnita Maxwell, MD;  Location: [facility] ENDOSCOPY;  Service: Endoscopy;  Laterality: N/A;  . ESOPHAGOGASTRODUODENOSCOPY (EGD) WITH PROPOFOL N/A 04/04/2016   Procedure: ESOPHAGOGASTRODUODENOSCOPY (EGD) WITH PROPOFOL;  Surgeon: Scot Jun, MD;  Location: [facility] ENDOSCOPY;  Service: Endoscopy;  Laterality: N/A;  .  ESOPHAGOGASTRODUODENOSCOPY (EGD) WITH PROPOFOL N/A 11/13/2016   Procedure: ESOPHAGOGASTRODUODENOSCOPY (EGD) WITH PROPOFOL;  Surgeon: Wyline MoodKiran Anna, MD;  Location: [facility] ENDOSCOPY;  Service: Endoscopy;  Laterality: N/A;  . NO PAST SURGERIES      Prior to Admission medications   Medication Sig Start Date End Date Taking? Authorizing Provider  acamprosate (CAMPRAL) 333 MG tablet Take 2 tablets (666 mg total) by mouth 3 (three) times daily with meals. 08/19/16   Audery AmelJohn T [name], MD  albuterol (PROVENTIL HFA;VENTOLIN HFA) 108 (90 Base) MCG/ACT inhaler Inhale 2 puffs into the lungs every 6 (six) hours as needed for wheezing or shortness of breath. 10/08/16   Shaune PollackQing Chen, MD  citalopram (CELEXA) 20 MG tablet Take 1 tablet (20 mg total) by mouth every morning. 08/19/16   Audery AmelJohn T [name], MD  furosemide (LASIX) 20 MG tablet Take 1 tablet (20 mg total) by mouth daily. 05/15/16   Alford Highlandichard Wieting, MD  lactulose (CHRONULAC) 10 GM/15ML solution Take 45 mLs (30 g total) by mouth every 4 (four) hours. 07/09/16   Katharina Caperima Vaickute, MD  levETIRAcetam (KEPPRA) 1000 MG tablet Take 1 tablet (1,000 mg total) by mouth 2 (two) times daily. 11/25/16   Adrian SaranSital Mody, MD  losartan (COZAAR) 25 MG tablet Take 25 mg by mouth daily.    Historical Provider, MD  metFORMIN (GLUCOPHAGE) 500 MG tablet Take 1 tablet (500 mg total) by mouth 2 (two) times daily with a meal. 05/15/16   Alford Highlandichard Wieting, MD  modafinil (PROVIGIL) 100 MG tablet Take 1 tablet (100 mg total) by mouth daily. 08/13/16   Auburn BilberryShreyang Patel, MD  Multiple Vitamin (MULTIVITAMIN WITH MINERALS) TABS tablet Take 1 tablet by mouth daily. 08/04/15   Ramonita LabAruna Gouru, MD  nadolol (CORGARD) 20 MG tablet Take 20 mg by mouth daily.    Historical Provider, MD  OLANZapine (ZYPREXA) 15 MG tablet Take 2 tablets (30 mg total) by mouth at bedtime. 05/22/16   Audery AmelJohn T [name], MD  ondansetron (ZOFRAN) 4 MG tablet Take 1 tablet (4 mg total) by mouth every 8 (eight) hours as needed for nausea or vomiting. 08/26/16    Governor Rooksebecca Lord, MD  pantoprazole (PROTONIX) 40 MG tablet Take 1 tablet (40 mg total) by mouth 2 (two) times daily before a meal. 11/14/16   Altamese DillingVaibhavkumar Vachhani, MD  rifaximin (XIFAXAN) 550 MG TABS tablet Take 1 tablet (550 mg total) by mouth 2 (two) times daily. 07/09/16   Katharina Caperima Vaickute, MD  senna-docusate (SENOKOT-S) 8.6-50 MG tablet Take 2 tablets by mouth at bedtime as needed for mild constipation. 11/14/16   Altamese DillingVaibhavkumar Vachhani, MD  spironolactone (ALDACTONE) 50 MG tablet Take 1 tablet (50 mg total) by mouth 2 (two) times daily. 07/09/16   Katharina Caperima Vaickute, MD  sucralfate (CARAFATE) 1 g tablet Take 1 g by mouth 4 (four) times daily.    Historical Provider, MD    No Known Allergies  Family History  Problem Relation Age of Onset  . Heart disease Mother   . Hypertension Mother   . Hyperlipidemia Mother   . Stroke Father   . Heart attack Father   . Hypertension Father   . Heart disease Father   . Alcohol abuse Father   . Heart disease Brother     Social History Social History  Substance Use Topics  . Smoking status: Never Smoker  . Smokeless tobacco: Never Used  . Alcohol use 30.0 oz/week    50 Cans of beer per week  Comment: last drink 4-5 days ago    Review of Systems Constitutional: Negative for fever. Cardiovascular: Negative for chest pain. Respiratory: Negative for shortness of breath. Gastrointestinal: Mild epigastric burning. Negative nausea vomiting or diarrhea. Genitourinary: Negative for dysuria. Musculoskeletal: Negative for back pain Neurological: Negative for headache 10-point ROS otherwise negative.  ____________________________________________   PHYSICAL EXAM:  VITAL SIGNS: ED Triage Vitals  Enc Vitals Group     BP 12/06/16 2017 (!) 147/71     Pulse Rate 12/06/16 2017 88     Resp 12/06/16 2017 18     Temp 12/06/16 2017 98.2 F (36.8 C)     Temp Source 12/06/16 2017 Oral     SpO2 12/06/16 2017 95 %     Weight 12/06/16 2017 (!) 361 lb (163.7 kg)      Height 12/06/16 2017 6\' 3"  (1.905 m)     Head Circumference --      Peak Flow --      Pain Score 12/06/16 2018 10     Pain Loc --      Pain Edu? --      Excl. in GC? --     Constitutional: Alert and oriented. Well appearing and in no distress. Eyes: Normal exam ENT   Head: Normocephalic and atraumatic.   Mouth/Throat: Mucous membranes are moist. Cardiovascular: Normal rate, regular rhythm. No murmur Respiratory: Normal respiratory effort without tachypnea nor retractions. Breath sounds are clear Gastrointestinal: Soft, mild epigastric tenderness palpation. No rebound or guarding. No distention. Musculoskeletal: Nontender with normal range of motion in all extremities.  Neurologic:  Normal speech and language. No gross focal neurologic deficits Skin:  Skin is warm, dry and intact.  Psychiatric: Mood and affect are normal.   ____________________________________________    EKG  EKG reviewed and interpreted by myself shows normal sinus rhythm at 85 bpm, narrow QRS, normal axis, normal intervals, nonspecific but no concerning ST changes.  ____________________________________________    INITIAL IMPRESSION / ASSESSMENT AND PLAN / ED COURSE  Pertinent labs & imaging results that were available during my care of the patient were reviewed by me and considered in my medical decision making (see chart for details).  The patient presents to the emergency department after a seizure. Patient does take Keppra for seizure disorder and has missed several doses medication recently. The patient also however recently quit drinking alcohol for 5 days ago after drinking 6-12 beers per day for many years. Patient's labs are largely at his baseline, slightly elevated ammonia. Patient is supposed be taking lactulose at home. We will dose Keppra in the emergency department which is the patient's baseline medication however we will also start the patient on Librium given the likelihood of an  alcohol withdrawal seizure. Patient states he is feeling well currently, anticipate likely Librium taper and discharge home. Patient and mother are agreeable to this plan.  Patient continues to appear well in the emergency department. Patient's labs show hematuria which in reviewing the patient's records appears to be long-standing, mod ammonia elevation for the patient is on lactulose. We'll place the patient on a Librium taper for the next 5 days we'll have the patient follow-up with his primary care doctor Monday. I discussed continued refraining from alcohol use. Patient agreeable to plan.  ____________________________________________   FINAL CLINICAL IMPRESSION(S) / ED DIAGNOSES  Seizure    Minna Antis, MD 12/06/16 2250

## 2016-12-06 NOTE — ED Notes (Signed)
Pt informed that urine is needed for UA, pt attempting to collect specimen at this time

## 2016-12-06 NOTE — ED Triage Notes (Addendum)
Pt says just prior to arrival he had a seizure and fell; pt says he was standing when he fell and hit right right side of his head, around right ear, on either a water heater or the floor; pt says he feels "fuzzy headed"; talking in complete coherent sentences; alert and oriented x 3; pt adds he has dull pain to his left abdomen, and sharp pain to his right abdomen; also having burning pain in his esophagus

## 2016-12-06 NOTE — ED Notes (Signed)
Pt taken to treatment room via wheelchair by Judeth CornfieldStephanie, ED tech; yellow fall risk band has been placed on pt's wrist, pt and mother verbalize understanding that pt should not get up without assistance; report called to Melony OverlyKuch, RN, who will be pt's primary nurse

## 2016-12-06 NOTE — Discharge Instructions (Signed)
Please take your medication as prescribed. Return to the emergency department for any further seizure-like activity. Otherwise follow up with her primary care doctor on Monday. Please continue to refrain from alcohol use.

## 2016-12-07 ENCOUNTER — Emergency Department
Admission: EM | Admit: 2016-12-07 | Discharge: 2016-12-07 | Disposition: A | Discharge status: Home / Self Care | Payer: Medicare Other | Attending: Emergency Medicine | Admitting: Emergency Medicine

## 2016-12-07 ENCOUNTER — Encounter: Payer: Self-pay | Admitting: Emergency Medicine

## 2016-12-07 DIAGNOSIS — J449 Chronic obstructive pulmonary disease, unspecified: Secondary | ICD-10-CM | POA: Diagnosis not present

## 2016-12-07 DIAGNOSIS — I10 Essential (primary) hypertension: Secondary | ICD-10-CM | POA: Diagnosis not present

## 2016-12-07 DIAGNOSIS — Z7984 Long term (current) use of oral hypoglycemic drugs: Secondary | ICD-10-CM | POA: Diagnosis not present

## 2016-12-07 DIAGNOSIS — E119 Type 2 diabetes mellitus without complications: Secondary | ICD-10-CM | POA: Diagnosis not present

## 2016-12-07 DIAGNOSIS — Z79899 Other long term (current) drug therapy: Secondary | ICD-10-CM | POA: Insufficient documentation

## 2016-12-07 DIAGNOSIS — R569 Unspecified convulsions: Secondary | ICD-10-CM | POA: Insufficient documentation

## 2016-12-07 HISTORY — DX: Unspecified convulsions: R56.9

## 2016-12-07 LAB — URINALYSIS, COMPLETE (UACMP) WITH MICROSCOPIC
BACTERIA UA: NONE SEEN
Bilirubin Urine: NEGATIVE
GLUCOSE, UA: NEGATIVE mg/dL
KETONES UR: NEGATIVE mg/dL
LEUKOCYTES UA: NEGATIVE
Nitrite: NEGATIVE
PROTEIN: 30 mg/dL — AB
SPECIFIC GRAVITY, URINE: 1.018 (ref 1.005–1.030)
Squamous Epithelial / LPF: NONE SEEN
pH: 7 (ref 5.0–8.0)

## 2016-12-07 LAB — CBC WITH DIFFERENTIAL/PLATELET
Basophils Absolute: 0.1 10*3/uL (ref 0–0.1)
Basophils Relative: 1 %
Eosinophils Absolute: 0.1 10*3/uL (ref 0–0.7)
Eosinophils Relative: 3 %
HEMATOCRIT: 30.2 % — AB (ref 40.0–52.0)
Hemoglobin: 9.5 g/dL — ABNORMAL LOW (ref 13.0–18.0)
LYMPHS PCT: 19 %
Lymphs Abs: 1.1 10*3/uL (ref 1.0–3.6)
MCH: 25 pg — ABNORMAL LOW (ref 26.0–34.0)
MCHC: 31.6 g/dL — AB (ref 32.0–36.0)
MCV: 79.1 fL — ABNORMAL LOW (ref 80.0–100.0)
MONO ABS: 0.6 10*3/uL (ref 0.2–1.0)
MONOS PCT: 10 %
NEUTROS ABS: 3.9 10*3/uL (ref 1.4–6.5)
Neutrophils Relative %: 67 %
Platelets: 144 10*3/uL — ABNORMAL LOW (ref 150–440)
RBC: 3.82 MIL/uL — ABNORMAL LOW (ref 4.40–5.90)
RDW: 21.9 % — AB (ref 11.5–14.5)
WBC: 5.8 10*3/uL (ref 3.8–10.6)

## 2016-12-07 LAB — BASIC METABOLIC PANEL
ANION GAP: 4 — AB (ref 5–15)
BUN: 18 mg/dL (ref 6–20)
CO2: 32 mmol/L (ref 22–32)
Calcium: 8.6 mg/dL — ABNORMAL LOW (ref 8.9–10.3)
Chloride: 100 mmol/L — ABNORMAL LOW (ref 101–111)
Creatinine, Ser: 0.78 mg/dL (ref 0.61–1.24)
GFR calc Af Amer: >60 mL/min (ref >60)
GFR calc non Af Amer: >60 mL/min (ref >60)
GLUCOSE: 191 mg/dL — AB (ref 65–99)
Potassium: 4.1 mmol/L (ref 3.5–5.1)
Sodium: 136 mmol/L (ref 135–145)

## 2016-12-07 MED ORDER — LEVETIRACETAM 250 MG PO TABS: 250.0000 mg | ORAL_TABLET | Freq: Two times a day (BID) | ORAL | 0 refills | Status: DC

## 2016-12-07 MED ORDER — LEVETIRACETAM 250 MG PO TABS
250.0000 mg | ORAL_TABLET | Freq: Once | ORAL | Status: AC
  Administered 2016-12-07: 250 mg via ORAL
  Filled 2016-12-07: qty 1

## 2016-12-07 NOTE — ED Triage Notes (Signed)
Pt presents to ER from  Home, pt reports he had a seizure about an hr ago prior to arrival mother reports lasted about 20 seconds, incontinent of urine pt reports seen in the ER last night for the same, instructed if he has more seizures to come to ER.Pt reports he is not drinking alcohol since about a week. Pt reports he feels when he is going to have a seizure reports he sat down. Pt's mother reports at times he misses his medications and not sure if he misses his seizure medications or not Pt talks in complete sentences no respiratory distress noted. Pt on oxygen due to COPD, 2L/[place]

## 2016-12-07 NOTE — ED Provider Notes (Signed)
[facility] Emergency Department Provider Note   ____________________________________________   First MD Initiated Contact with Patient 12/07/16 1932     (approximate)  I have reviewed the triage vital signs and the nursing notes.   HISTORY  Chief Complaint Seizures   HPI Howard [name] is a 35 y.o. male with a history of seizures as well as alcoholism who was present in the emergency department for a seizure. He was seen in the department last night for a seizure as well. Per the patient's mother he had a 20 32nd generalized tonic-clonic seizure earlier this evening. He is now at his baseline mental status. The patient reports that he usually only has a seizure every several months. He has been compliant with his Keppra at home. He also recently stopped drinking. He says that he was down to about a 6 pack of beer per day and then 5 days ago stopped drinking entirely. Yesterday he was loaded with Keppra and also discharged with a Librium taper. The patient has not been able to get his Librium because of pharmacies being closed on Sunday.   Past Medical History:  Diagnosis Date  . Alcoholic cirrhosis of liver with ascites (HCC)   . Anemia   . COPD (chronic obstructive pulmonary disease) (HCC)   . Depression   . Diabetes (HCC)   . Diabetes mellitus, type II (HCC)   . Esophageal varices (HCC)   . Heart disease   . Hyperlipemia   . Hypertension   . Liver disease   . Multiple thyroid nodules   . Portal hypertensive gastropathy   . Schizophrenia (HCC)   . Seizures [facility])     Patient Active Problem List   Diagnosis Date Noted  . GIB (gastrointestinal bleeding) 10/05/2016  . Ascites 10/04/2016  . DNR (do not resuscitate) discussion 08/11/2016  . Palliative care by specialist 08/11/2016  . Muscle weakness (generalized)   . Acute upper GI bleed   . GI bleed 08/07/2016  . Alcoholic cirrhosis of liver (HCC) 07/09/2016  . Acute respiratory failure with  hypercapnia (HCC) 07/09/2016  . Hyperglycemia 07/09/2016  . Seizure (HCC) 06/10/2016  . Altered mental status 05/12/2016  . Headache 04/29/2016  . OSA (obstructive sleep apnea) 04/29/2016  . Anemia 04/29/2016  . Thrombocytopenia (HCC) 04/29/2016  . Coagulopathy (HCC) 04/29/2016  . Obesity 04/29/2016  . Generalized weakness 04/29/2016  . Acute hepatic encephalopathy 03/14/2016  . Controlled type 2 diabetes mellitus without complication (HCC) 01/15/2016  . Normocytic anemia 12/12/2015  . Essential (primary) hypertension 12/11/2015  . Pure hypercholesterolemia 12/11/2015  . Fever 11/11/2015  . Ascites due to alcoholic cirrhosis (HCC) 11/11/2015  . Cirrhosis of liver with ascites (HCC) 11/11/2015  . Hepatic encephalopathy (HCC) 10/16/2015  . Type 2 diabetes mellitus (HCC) 10/16/2015  . Acute renal failure (ARF) (HCC) 07/30/2015  . 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 Alcoholic cirrhosis (HCC) 07/19/2015  . Hypoxia 07/04/2015  . Elevated transaminase level 07/04/2015  . DOE (dyspnea on exertion) 07/04/2015  . Alcohol abuse 05/31/2015  . Hypertension 05/29/2015  . Paranoid schizophrenia (HCC) 03/20/2015    Past Surgical History:  Procedure Laterality Date  . ESOPHAGOGASTRODUODENOSCOPY N/A 10/05/2015   Procedure: ESOPHAGOGASTRODUODENOSCOPY (EGD);  Surgeon: Elnita Maxwell, MD;  Location[facility] ENDOSCOPY;  Service: Endoscopy;  Laterality: N/A;  . ESOPHAGOGASTRODUODENOSCOPY (EGD) WITH PROPOFOL N/A 08/03/2015   Procedure: ESOPHAGOGASTRODUODENOSCOPY (EGD) WITH PROPOFOL;  Surgeon: Elnita Maxwell, MD;  Location: [facility] ENDOSCOPY;  Service: Endoscopy;  Laterality: N/A;  .  ESOPHAGOGASTRODUODENOSCOPY (EGD) WITH PROPOFOL N/A 08/31/2015   Procedure: ESOPHAGOGASTRODUODENOSCOPY (EGD) WITH PROPOFOL;  Surgeon: Elnita Maxwell, MD;  Location: [facility] ENDOSCOPY;  Service: Endoscopy;  Laterality: N/A;  . ESOPHAGOGASTRODUODENOSCOPY (EGD) WITH PROPOFOL N/A 04/04/2016    Procedure: ESOPHAGOGASTRODUODENOSCOPY (EGD) WITH PROPOFOL;  Surgeon: Scot Jun, MD;  Location: [facility] ENDOSCOPY;  Service: Endoscopy;  Laterality: N/A;  . ESOPHAGOGASTRODUODENOSCOPY (EGD) WITH PROPOFOL N/A 11/13/2016   Procedure: ESOPHAGOGASTRODUODENOSCOPY (EGD) WITH PROPOFOL;  Surgeon: Wyline Mood, MD;  Location: [facility] ENDOSCOPY;  Service: Endoscopy;  Laterality: N/A;  . NO PAST SURGERIES      Prior to Admission medications   Medication Sig Start Date End Date Taking? Authorizing Provider  acamprosate (CAMPRAL) 333 MG tablet Take 2 tablets (666 mg total) by mouth 3 (three) times daily with meals. 08/19/16   Audery Amel, MD  albuterol (PROVENTIL HFA;VENTOLIN HFA) 108 (90 Base) MCG/ACT inhaler Inhale 2 puffs into the lungs every 6 (six) hours as needed for wheezing or shortness of breath. 10/08/16   Shaune Pollack, MD  chlordiazePOXIDE (LIBRIUM) 10 MG capsule Day 1-2: Take 1 tablet PO TID Day 3-4: Take 1 tablet PO BID Day 4-5: Take 1 tablet PO QD Day 6: STOP 12/06/16   Minna Antis, MD  citalopram (CELEXA) 20 MG tablet Take 1 tablet (20 mg total) by mouth every morning. 08/19/16   Audery Amel, MD  furosemide (LASIX) 20 MG tablet Take 1 tablet (20 mg total) by mouth daily. 05/15/16   Alford Highland, MD  lactulose (CHRONULAC) 10 GM/15ML solution Take 45 mLs (30 g total) by mouth every 4 (four) hours. 07/09/16   Katharina Caper, MD  levETIRAcetam (KEPPRA) 1000 MG tablet Take 1 tablet (1,000 mg total) by mouth 2 (two) times daily. 11/25/16   Adrian Saran, MD  losartan (COZAAR) 25 MG tablet Take 25 mg by mouth daily.    Historical Provider, MD  metFORMIN (GLUCOPHAGE) 500 MG tablet Take 1 tablet (500 mg total) by mouth 2 (two) times daily with a meal. 05/15/16   Alford Highland, MD  modafinil (PROVIGIL) 100 MG tablet Take 1 tablet (100 mg total) by mouth daily. 08/13/16   Auburn Bilberry, MD  Multiple Vitamin (MULTIVITAMIN WITH MINERALS) TABS tablet Take 1 tablet by mouth daily. 08/04/15   Ramonita Lab,  MD  nadolol (CORGARD) 20 MG tablet Take 20 mg by mouth daily.    Historical Provider, MD  OLANZapine (ZYPREXA) 15 MG tablet Take 2 tablets (30 mg total) by mouth at bedtime. 05/22/16   Audery Amel, MD  ondansetron (ZOFRAN) 4 MG tablet Take 1 tablet (4 mg total) by mouth every 8 (eight) hours as needed for nausea or vomiting. 08/26/16   Governor Rooks, MD  pantoprazole (PROTONIX) 40 MG tablet Take 1 tablet (40 mg total) by mouth 2 (two) times daily before a meal. 11/14/16   Altamese Dilling, MD  rifaximin (XIFAXAN) 550 MG TABS tablet Take 1 tablet (550 mg total) by mouth 2 (two) times daily. 07/09/16   Katharina Caper, MD  senna-docusate (SENOKOT-S) 8.6-50 MG tablet Take 2 tablets by mouth at bedtime as needed for mild constipation. 11/14/16   Altamese Dilling, MD  spironolactone (ALDACTONE) 50 MG tablet Take 1 tablet (50 mg total) by mouth 2 (two) times daily. 07/09/16   Katharina Caper, MD  sucralfate (CARAFATE) 1 g tablet Take 1 g by mouth 4 (four) times daily.    Historical Provider, MD    Allergies Patient has no known allergies.  Family History  Problem Relation Age  of Onset  . Heart disease Mother   . Hypertension Mother   . Hyperlipidemia Mother   . Stroke Father   . Heart attack Father   . Hypertension Father   . Heart disease Father   . Alcohol abuse Father   . Heart disease Brother     Social History Social History  Substance Use Topics  . Smoking status: Never Smoker  . Smokeless tobacco: Never Used  . Alcohol use 30.0 oz/week    50 Cans of beer per week     Comment: last drink 4-5 days ago    Review of Systems Constitutional: No fever/chills Eyes: No visual changes. ENT: No sore throat. Cardiovascular: Denies chest pain. Respiratory: Denies shortness of breath. Gastrointestinal: No abdominal pain.  No nausea, no vomiting.  No diarrhea.  No constipation. Genitourinary: Negative for dysuria. Musculoskeletal: Negative for back pain. Skin: Negative for  rash. Neurological: Negative for headaches, focal weakness or numbness.  10-point ROS otherwise negative.  ____________________________________________   PHYSICAL EXAM:  VITAL SIGNS: ED Triage Vitals  Enc Vitals Group     BP 12/07/16 1830 118/66     Pulse Rate 12/07/16 1830 81     Resp 12/07/16 1830 20     Temp 12/07/16 1830 98.3 F (36.8 C)     Temp Source 12/07/16 1830 Oral     SpO2 12/07/16 1830 93 %     Weight 12/07/16 1831 (!) 361 lb (163.7 kg)     Height 12/07/16 1831 6\' 3"  (1.905 m)     Head Circumference --      Peak Flow --      Pain Score 12/07/16 1832 6     Pain Loc --      Pain Edu? --      Excl. in GC? --     Constitutional: Alert and oriented. Well appearing and in no acute distress. Eyes: Conjunctivae are normal. PERRL. EOMI. Head: Atraumatic. Nose: No congestion/rhinnorhea. Mouth/Throat: Mucous membranes are moist.  No tongue bite. Neck: No stridor.   Cardiovascular: Normal rate, regular rhythm. Grossly normal heart sounds.   Respiratory: Normal respiratory effort.  No retractions. Lungs CTAB. Gastrointestinal: Soft and nontender. No distention.  Musculoskeletal: No lower extremity tenderness nor edema.  No joint effusions. Neurologic:  Normal speech and language. No gross focal neurologic deficits are appreciated.  Mild tremulousness to the bilateral hands which the patient says is his baseline and has been there for several years without any worsening. Skin:  Skin is warm, dry and intact. No rash noted. Psychiatric: Mood and affect are normal. Speech and behavior are normal.  ____________________________________________   LABS (all labs ordered are listed, but only abnormal results are displayed)  Labs Reviewed  BASIC METABOLIC PANEL - Abnormal; Notable for the following:       Result Value   Chloride 100 (*)    Glucose, Bld 191 (*)    Calcium 8.6 (*)    Anion gap 4 (*)    All other components within normal limits  CBC WITH  DIFFERENTIAL/PLATELET - Abnormal; Notable for the following:    RBC 3.82 (*)    Hemoglobin 9.5 (*)    HCT 30.2 (*)    MCV 79.1 (*)    MCH 25.0 (*)    MCHC 31.6 (*)    RDW 21.9 (*)    Platelets 144 (*)    All other components within normal limits  URINALYSIS, COMPLETE (UACMP) WITH MICROSCOPIC - Abnormal; Notable for the following:  Color, Urine YELLOW (*)    APPearance CLEAR (*)    Hgb urine dipstick LARGE (*)    Protein, ur 30 (*)    All other components within normal limits   ____________________________________________  EKG   ____________________________________________  RADIOLOGY   ____________________________________________   PROCEDURES  Procedure(s) performed:   Procedures  Critical Care performed:   ____________________________________________   INITIAL IMPRESSION / ASSESSMENT AND PLAN / ED COURSE  Pertinent labs & imaging results that were available during my care of the patient were reviewed by me and considered in my medical decision making (see chart for details).  ----------------------------------------- 8:45 PM on 12/07/2016 -----------------------------------------  Discussed case with Dr. Amada JupiterKirkpatrick of neurology. He agrees with raising the patient's dose of Keppra 1000 mg twice a day. Very unusual for the patient to have a withdrawal seizure 5 days out. Reassuring vital signs that do not indicate excitability. More likely a seizure secondary to the patient's existing seizure disorder. He continues Librium taper as needed. However, I believe raising is Keppra dose will be of more utility. He will also be following up with neurology. I explained the plan to the patient as well as the family are understandable and to comply.      ____________________________________________   FINAL CLINICAL IMPRESSION(S) / ED DIAGNOSES  Seizure.    NEW MEDICATIONS STARTED DURING THIS VISIT:  New Prescriptions   No medications on file     Note:   This document was prepared using Dragon voice recognition software and may include unintentional dictation errors.    Myrna Blazeravid Matthew Shimon Trowbridge, MD 12/07/16 [phone]

## 2016-12-07 NOTE — ED Notes (Signed)
Pt. Presents to ED with multiple seizures in the past 24 hours, the last one at around 18:30 this evening.  Pt. States tonight he was able to feel seizure start and was able to sit down.  Mother reports seizure lasted around 20 seconds.  Pt. Alert and oriented at this time.  Mother states pt. may have missed some of his medication in the past week.

## 2016-12-08 ENCOUNTER — Telehealth: Payer: Self-pay

## 2016-12-08 NOTE — Telephone Encounter (Signed)
pharmacy is requesting a refill on trazodone 100mg  i thought this medication was discontinuned.  i also dont see it on his list.  just need to confirm if you want patient taking medications

## 2016-12-10 ENCOUNTER — Other Ambulatory Visit: Payer: Self-pay | Admitting: Psychiatry

## 2016-12-10 NOTE — Telephone Encounter (Signed)
Yes, as far as I can tell and as far as I remember that medicine is discontinued. Thank you for picking up on it. No refill will be ordered.

## 2016-12-11 ENCOUNTER — Emergency Department: Payer: Medicare Other

## 2016-12-11 ENCOUNTER — Emergency Department
Admission: EM | Admit: 2016-12-11 | Discharge: 2016-12-11 | Disposition: A | Discharge status: Home / Self Care | Payer: Medicare Other | Source: Home / Self Care | Attending: Emergency Medicine | Admitting: Emergency Medicine

## 2016-12-11 DIAGNOSIS — Z7984 Long term (current) use of oral hypoglycemic drugs: Secondary | ICD-10-CM

## 2016-12-11 DIAGNOSIS — R569 Unspecified convulsions: Secondary | ICD-10-CM | POA: Diagnosis not present

## 2016-12-11 DIAGNOSIS — I10 Essential (primary) hypertension: Secondary | ICD-10-CM

## 2016-12-11 DIAGNOSIS — Z79899 Other long term (current) drug therapy: Secondary | ICD-10-CM | POA: Insufficient documentation

## 2016-12-11 DIAGNOSIS — Z5181 Encounter for therapeutic drug level monitoring: Secondary | ICD-10-CM

## 2016-12-11 DIAGNOSIS — J449 Chronic obstructive pulmonary disease, unspecified: Secondary | ICD-10-CM | POA: Insufficient documentation

## 2016-12-11 DIAGNOSIS — G40909 Epilepsy, unspecified, not intractable, without status epilepticus: Secondary | ICD-10-CM

## 2016-12-11 DIAGNOSIS — E119 Type 2 diabetes mellitus without complications: Secondary | ICD-10-CM

## 2016-12-11 LAB — HEPATIC FUNCTION PANEL
ALT: 31 U/L (ref 17–63)
AST: 37 U/L (ref 15–41)
Albumin: 3.5 g/dL (ref 3.5–5.0)
Alkaline Phosphatase: 109 U/L (ref 38–126)
Total Bilirubin: 0.6 mg/dL (ref 0.3–1.2)
Total Protein: 7.9 g/dL (ref 6.5–8.1)

## 2016-12-11 LAB — URINALYSIS, COMPLETE (UACMP) WITH MICROSCOPIC
BACTERIA UA: NONE SEEN
Bilirubin Urine: NEGATIVE
GLUCOSE, UA: NEGATIVE mg/dL
KETONES UR: 5 mg/dL — AB
LEUKOCYTES UA: NEGATIVE
NITRITE: NEGATIVE
PROTEIN: 100 mg/dL — AB
Specific Gravity, Urine: 1.025 (ref 1.005–1.030)
pH: 5 (ref 5.0–8.0)

## 2016-12-11 LAB — CBC WITH DIFFERENTIAL/PLATELET
BASOS ABS: 0.1 10*3/uL (ref 0–0.1)
BASOS PCT: 1 %
Eosinophils Absolute: 0.2 10*3/uL (ref 0–0.7)
Eosinophils Relative: 3 %
HEMATOCRIT: 30.7 % — AB (ref 40.0–52.0)
Hemoglobin: 9.4 g/dL — ABNORMAL LOW (ref 13.0–18.0)
Lymphocytes Relative: 16 %
Lymphs Abs: 1.2 10*3/uL (ref 1.0–3.6)
MCH: 24.4 pg — ABNORMAL LOW (ref 26.0–34.0)
MCHC: 30.6 g/dL — ABNORMAL LOW (ref 32.0–36.0)
MCV: 79.7 fL — ABNORMAL LOW (ref 80.0–100.0)
Monocytes Absolute: 0.9 10*3/uL (ref 0.2–1.0)
Monocytes Relative: 11 %
NEUTROS ABS: 5.3 10*3/uL (ref 1.4–6.5)
NEUTROS PCT: 69 %
Platelets: 134 10*3/uL — ABNORMAL LOW (ref 150–440)
RBC: 3.85 MIL/uL — AB (ref 4.40–5.90)
RDW: 21.1 % — ABNORMAL HIGH (ref 11.5–14.5)
WBC: 7.6 10*3/uL (ref 3.8–10.6)

## 2016-12-11 LAB — BASIC METABOLIC PANEL
Anion gap: 5 (ref 5–15)
BUN: 19 mg/dL (ref 6–20)
CALCIUM: 8.6 mg/dL — AB (ref 8.9–10.3)
CHLORIDE: 101 mmol/L (ref 101–111)
CO2: 31 mmol/L (ref 22–32)
Creatinine, Ser: 0.86 mg/dL (ref 0.61–1.24)
Glucose, Bld: 256 mg/dL — ABNORMAL HIGH (ref 65–99)
POTASSIUM: 4.6 mmol/L (ref 3.5–5.1)
SODIUM: 137 mmol/L (ref 135–145)

## 2016-12-11 LAB — URINE DRUG SCREEN, QUALITATIVE (ARMC ONLY)
AMPHETAMINES, UR SCREEN: NOT DETECTED
BENZODIAZEPINE, UR SCRN: POSITIVE — AB
Barbiturates, Ur Screen: NOT DETECTED
Cannabinoid 50 Ng, Ur ~~LOC~~: NOT DETECTED
Cocaine Metabolite,Ur ~~LOC~~: NOT DETECTED
MDMA (ECSTASY) UR SCREEN: NOT DETECTED
METHADONE SCREEN, URINE: NOT DETECTED
Opiate, Ur Screen: NOT DETECTED
PHENCYCLIDINE (PCP) UR S: NOT DETECTED
TRICYCLIC, UR SCREEN: POSITIVE — AB

## 2016-12-11 LAB — PROTIME-INR
INR: 1.08
PROTHROMBIN TIME: 14 s (ref 11.4–15.2)

## 2016-12-11 LAB — AMMONIA: Ammonia: 90 umol/L — ABNORMAL HIGH (ref 9–35)

## 2016-12-11 LAB — TSH: TSH: 1.796 u[IU]/mL (ref 0.350–4.500)

## 2016-12-11 MED ORDER — LEVETIRACETAM 750 MG PO TABS: 1500.0000 mg | ORAL_TABLET | Freq: Two times a day (BID) | ORAL | 0 refills | Status: DC

## 2016-12-11 MED ORDER — LEVETIRACETAM 500 MG PO TABS
1500.0000 mg | ORAL_TABLET | Freq: Once | ORAL | Status: AC
  Administered 2016-12-11: 1500 mg via ORAL
  Filled 2016-12-11: qty 3

## 2016-12-11 NOTE — ED Notes (Signed)
Fall mats placed on floor. Seizure pads placed on bed. Suction at bedside

## 2016-12-11 NOTE — Discharge Instructions (Signed)
YOUR NEW DOSE OF KEPPRA IS 1500MG  TWICE A DAY.  THIS IS 2 OF THE 750MG  TABLETS IN THE MORNING AND TWO IN THE EVENING.  Please make an appointment to see your neurologist Dr. Malvin Johns next week. Please take your new dose of Keppra that I prescribed U today. Return to the emergency department for any concerns.  It was a pleasure to take care of you today, and thank you for coming to our emergency department.  If you have any questions or concerns before leaving please ask the nurse to grab me and I'm more than happy to go through your aftercare instructions again.  If you were prescribed any opioid pain medication today such as Norco, Vicodin, Percocet, morphine, hydrocodone, or oxycodone please make sure you do not drive when you are taking this medication as it can alter your ability to drive safely.  If you have any concerns once you are home that you are not improving or are in fact getting worse before you can make it to your follow-up appointment, please do not hesitate to call [phone] and come back for further evaluation.  Merrily Brittle MD  Results for orders placed or performed during the hospital encounter of 12/11/16  Ammonia  Result Value Ref Range   Ammonia 90 (H) 9 - 35 umol/L  Basic metabolic panel  Result Value Ref Range   Sodium 137 135 - 145 mmol/L   Potassium 4.6 3.5 - 5.1 mmol/L   Chloride 101 101 - 111 mmol/L   CO2 31 22 - 32 mmol/L   Glucose, Bld 256 (H) 65 - 99 mg/dL   BUN 19 6 - 20 mg/dL   Creatinine, Ser 1.61 0.61 - 1.24 mg/dL   Calcium 8.6 (L) 8.9 - 10.3 mg/dL   GFR calc non Af Amer >60 >60 mL/min   GFR calc Af Amer >60 >60 mL/min   Anion gap 5 5 - 15  Hepatic function panel  Result Value Ref Range   Total Protein 7.9 6.5 - 8.1 g/dL   Albumin 3.5 3.5 - 5.0 g/dL   AST 37 15 - 41 U/L   ALT 31 17 - 63 U/L   Alkaline Phosphatase 109 38 - 126 U/L   Total Bilirubin 0.6 0.3 - 1.2 mg/dL   Bilirubin, Direct <0.9 (L) 0.1 - 0.5 mg/dL   Indirect Bilirubin NOT CALCULATED  0.3 - 0.9 mg/dL  CBC with Differential  Result Value Ref Range   WBC 7.6 3.8 - 10.6 K/uL   RBC 3.85 (L) 4.40 - 5.90 MIL/uL   Hemoglobin 9.4 (L) 13.0 - 18.0 g/dL   HCT 60.4 (L) 54.0 - 98.1 %   MCV 79.7 (L) 80.0 - 100.0 fL   MCH 24.4 (L) 26.0 - 34.0 pg   MCHC 30.6 (L) 32.0 - 36.0 g/dL   RDW 19.1 (H) 47.8 - 29.5 %   Platelets 134 (L) 150 - 440 K/uL   Neutrophils Relative % 69 %   Neutro Abs 5.3 1.4 - 6.5 K/uL   Lymphocytes Relative 16 %   Lymphs Abs 1.2 1.0 - 3.6 K/uL   Monocytes Relative 11 %   Monocytes Absolute 0.9 0.2 - 1.0 K/uL   Eosinophils Relative 3 %   Eosinophils Absolute 0.2 0 - 0.7 K/uL   Basophils Relative 1 %   Basophils Absolute 0.1 0 - 0.1 K/uL  Urinalysis, Complete w Microscopic  Result Value Ref Range   Color, Urine AMBER (A) YELLOW   APPearance CLEAR (A) CLEAR  Specific Gravity, Urine 1.025 1.005 - 1.030   pH 5.0 5.0 - 8.0   Glucose, UA NEGATIVE NEGATIVE mg/dL   Hgb urine dipstick LARGE (A) NEGATIVE   Bilirubin Urine NEGATIVE NEGATIVE   Ketones, ur 5 (A) NEGATIVE mg/dL   Protein, ur 409100 (A) NEGATIVE mg/dL   Nitrite NEGATIVE NEGATIVE   Leukocytes, UA NEGATIVE NEGATIVE   RBC / HPF TOO NUMEROUS TO COUNT 0 - 5 RBC/hpf   WBC, UA 0-5 0 - 5 WBC/hpf   Bacteria, UA NONE SEEN NONE SEEN   Squamous Epithelial / LPF 0-5 (A) NONE SEEN   Mucous PRESENT    Hyaline Casts, UA PRESENT   TSH  Result Value Ref Range   TSH 1.796 0.350 - 4.500 uIU/mL  Urine Drug Screen, Qualitative  Result Value Ref Range   Tricyclic, Ur Screen POSITIVE (A) NONE DETECTED   Amphetamines, Ur Screen NONE DETECTED NONE DETECTED   MDMA (Ecstasy)Ur Screen NONE DETECTED NONE DETECTED   Cocaine Metabolite,Ur [place] NONE DETECTED NONE DETECTED   Opiate, Ur Screen NONE DETECTED NONE DETECTED   Phencyclidine (PCP) Ur S NONE DETECTED NONE DETECTED   Cannabinoid 50 Ng, Ur [place] NONE DETECTED NONE DETECTED   Barbiturates, Ur Screen NONE DETECTED NONE DETECTED   Benzodiazepine, Ur Scrn POSITIVE (A) NONE  DETECTED   Methadone Scn, Ur NONE DETECTED NONE DETECTED   Ct Head Wo Contrast  Result Date: 12/11/2016 CLINICAL DATA:  Detoxing from alcohol seizures EXAM: CT HEAD WITHOUT CONTRAST TECHNIQUE: Contiguous axial images were obtained from the base of the skull through the vertex without intravenous contrast. COMPARISON:  09/21/2016 FINDINGS: Brain: No evidence of acute infarction, hemorrhage, hydrocephalus, extra-axial collection or mass lesion/mass effect. Vascular: No hyperdense vessel or unexpected calcification. Skull: Normal. Negative for fracture or focal lesion. Sinuses/Orbits: No acute finding. Other: None IMPRESSION: No CT evidence for acute intracranial abnormality. Electronically Signed   By: Jasmine PangKim  Fujinaga M.D.   On: 12/11/2016 20:08   [place] Abdomen Limited  Result Date: 11/13/2016 CLINICAL DATA:  36 year old male with abdominal swelling. EXAM: LIMITED ABDOMEN ULTRASOUND FOR ASCITES TECHNIQUE: Limited ultrasound survey for ascites was performed in all four abdominal quadrants. COMPARISON:  None. FINDINGS: Sonographic interrogation of the 4 quadrants of the abdomen demonstrates no ascites. IMPRESSION: Negative for ascites. Electronically Signed   By: Malachy MoanHeath  McCullough M.D.   On: 11/13/2016 17:47   Dg Chest Port 1 View  Result Date: 12/11/2016 CLINICAL DATA:  Fever and cough for 2-3 days EXAM: PORTABLE CHEST 1 VIEW COMPARISON:  10/05/2016 FINDINGS: There is borderline cardiomegaly. Patchy atelectasis or small infiltrate at the left base. No pneumothorax. IMPRESSION: 1. Mild cardiomegaly with central vascular congestion 2. Patchy atelectasis or infiltrate at the left lung base. Electronically Signed   By: Jasmine PangKim  Fujinaga M.D.   On: 12/11/2016 20:26

## 2016-12-11 NOTE — ED Provider Notes (Signed)
[facility] Emergency Department Provider Note  ____________________________________________   First MD Initiated Contact with Patient 12/11/16 1918     (approximate)  I have reviewed the triage vital signs and the nursing notes.   HISTORY  Chief Complaint Seizures    HPI Howard [name] is a 36 y.o. male who comes to the emergency department after having 2 generalized tonic-clonic seizures today. History obtained largely from the patient's mom as the patient has developmental delay. The patient has had a history of seizure disorder for the past 5 months. His neurologist Dr. Malvin Johns has performed an MRI and an EEG which are both unremarkable. It is unclear if this is secondary to brain damage from his chronic alcohol use or represents true underlying seizure disorder. His last drink of alcohol was 8 days ago. He seen in our emergency department 3 days ago after a generalized tonic-clonic seizure with his Keppra was increased from 500 mg by mouth twice a day to 750 mg by mouth twice a day. His mom said that he went home and yesterday and the day before had one generalized tonic-clonic seizure each and then today he had 2 generalized tonic-clonic seizures which prompted the visit. He has not been sick recently. He's had no fevers or chills. No cough.   Past Medical History:  Diagnosis Date  . Alcoholic cirrhosis of liver with ascites (HCC)   . Anemia   . COPD (chronic obstructive pulmonary disease) (HCC)   . Depression   . Diabetes (HCC)   . Diabetes mellitus, type II (HCC)   . Esophageal varices (HCC)   . Heart disease   . Hyperlipemia   . Hypertension   . Liver disease   . Multiple thyroid nodules   . Portal hypertensive gastropathy   . Schizophrenia (HCC)   . Seizures [facility])     Patient Active Problem List   Diagnosis Date Noted  . GIB (gastrointestinal bleeding) 10/05/2016  . Ascites 10/04/2016  . DNR (do not resuscitate) discussion 08/11/2016  .  Palliative care by specialist 08/11/2016  . Muscle weakness (generalized)   . Acute upper GI bleed   . GI bleed 08/07/2016  . Alcoholic cirrhosis of liver (HCC) 07/09/2016  . Acute respiratory failure with hypercapnia (HCC) 07/09/2016  . Hyperglycemia 07/09/2016  . Seizure (HCC) 06/10/2016  . Altered mental status 05/12/2016  . Headache 04/29/2016  . OSA (obstructive sleep apnea) 04/29/2016  . Anemia 04/29/2016  . Thrombocytopenia (HCC) 04/29/2016  . Coagulopathy (HCC) 04/29/2016  . Obesity 04/29/2016  . Generalized weakness 04/29/2016  . Acute hepatic encephalopathy 03/14/2016  . Controlled type 2 diabetes mellitus without complication (HCC) 01/15/2016  . Normocytic anemia 12/12/2015  . Essential (primary) hypertension 12/11/2015  . Pure hypercholesterolemia 12/11/2015  . Fever 11/11/2015  . Ascites due to alcoholic cirrhosis (HCC) 11/11/2015  . Cirrhosis of liver with ascites (HCC) 11/11/2015  . Hepatic encephalopathy (HCC) 10/16/2015  . Type 2 diabetes mellitus (HCC) 10/16/2015  . Acute renal failure (ARF) (HCC) 07/30/2015  . 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 Alcoholic cirrhosis (HCC) 07/19/2015  . Hypoxia 07/04/2015  . Elevated transaminase level 07/04/2015  . DOE (dyspnea on exertion) 07/04/2015  . Alcohol abuse 05/31/2015  . Hypertension 05/29/2015  . Paranoid schizophrenia (HCC) 03/20/2015    Past Surgical History:  Procedure Laterality Date  . ESOPHAGOGASTRODUODENOSCOPY N/A 10/05/2015   Procedure: ESOPHAGOGASTRODUODENOSCOPY (EGD);  Surgeon: Elnita Maxwell, MD;  Location: [facility] ENDOSCOPY;  Service: Endoscopy;  Laterality: N/A;  .  ESOPHAGOGASTRODUODENOSCOPY (EGD) WITH PROPOFOL N/A 08/03/2015   Procedure: ESOPHAGOGASTRODUODENOSCOPY (EGD) WITH PROPOFOL;  Surgeon: Elnita Maxwell, MD;  Location: [facility] ENDOSCOPY;  Service: Endoscopy;  Laterality: N/A;  . ESOPHAGOGASTRODUODENOSCOPY (EGD) WITH PROPOFOL N/A 08/31/2015   Procedure:  ESOPHAGOGASTRODUODENOSCOPY (EGD) WITH PROPOFOL;  Surgeon: Elnita Maxwell, MD;  Location: [facility] ENDOSCOPY;  Service: Endoscopy;  Laterality: N/A;  . ESOPHAGOGASTRODUODENOSCOPY (EGD) WITH PROPOFOL N/A 04/04/2016   Procedure: ESOPHAGOGASTRODUODENOSCOPY (EGD) WITH PROPOFOL;  Surgeon: Scot Jun, MD;  Location: [facility] ENDOSCOPY;  Service: Endoscopy;  Laterality: N/A;  . ESOPHAGOGASTRODUODENOSCOPY (EGD) WITH PROPOFOL N/A 11/13/2016   Procedure: ESOPHAGOGASTRODUODENOSCOPY (EGD) WITH PROPOFOL;  Surgeon: Wyline Mood, MD;  Location: [facility] ENDOSCOPY;  Service: Endoscopy;  Laterality: N/A;  . NO PAST SURGERIES      Prior to Admission medications   Medication Sig Start Date End Date Taking? Authorizing Provider  acamprosate (CAMPRAL) 333 MG tablet Take 2 tablets (666 mg total) by mouth 3 (three) times daily with meals. 08/19/16   Audery Amel, MD  albuterol (PROVENTIL HFA;VENTOLIN HFA) 108 (90 Base) MCG/ACT inhaler Inhale 2 puffs into the lungs every 6 (six) hours as needed for wheezing or shortness of breath. 10/08/16   Shaune Pollack, MD  chlordiazePOXIDE (LIBRIUM) 10 MG capsule Day 1-2: Take 1 tablet PO TID Day 3-4: Take 1 tablet PO BID Day 4-5: Take 1 tablet PO QD Day 6: STOP 12/06/16   Minna Antis, MD  citalopram (CELEXA) 20 MG tablet Take 1 tablet (20 mg total) by mouth every morning. 08/19/16   Audery Amel, MD  furosemide (LASIX) 20 MG tablet Take 1 tablet (20 mg total) by mouth daily. 05/15/16   Alford Highland, MD  lactulose (CHRONULAC) 10 GM/15ML solution Take 45 mLs (30 g total) by mouth every 4 (four) hours. 07/09/16   Katharina Caper, MD  levETIRAcetam (KEPPRA) 750 MG tablet Take 2 tablets (1,500 mg total) by mouth 2 (two) times daily. 12/11/16 02/09/17  Merrily Brittle, MD  losartan (COZAAR) 25 MG tablet Take 25 mg by mouth daily.    Historical Provider, MD  metFORMIN (GLUCOPHAGE) 500 MG tablet Take 1 tablet (500 mg total) by mouth 2 (two) times daily with a meal. 05/15/16   Alford Highland,  MD  modafinil (PROVIGIL) 100 MG tablet Take 1 tablet (100 mg total) by mouth daily. 08/13/16   Auburn Bilberry, MD  Multiple Vitamin (MULTIVITAMIN WITH MINERALS) TABS tablet Take 1 tablet by mouth daily. 08/04/15   Ramonita Lab, MD  nadolol (CORGARD) 20 MG tablet Take 20 mg by mouth daily.    Historical Provider, MD  OLANZapine (ZYPREXA) 15 MG tablet Take 2 tablets (30 mg total) by mouth at bedtime. 05/22/16   Audery Amel, MD  ondansetron (ZOFRAN) 4 MG tablet Take 1 tablet (4 mg total) by mouth every 8 (eight) hours as needed for nausea or vomiting. 08/26/16   Governor Rooks, MD  pantoprazole (PROTONIX) 40 MG tablet Take 1 tablet (40 mg total) by mouth 2 (two) times daily before a meal. 11/14/16   Altamese Dilling, MD  rifaximin (XIFAXAN) 550 MG TABS tablet Take 1 tablet (550 mg total) by mouth 2 (two) times daily. 07/09/16   Katharina Caper, MD  senna-docusate (SENOKOT-S) 8.6-50 MG tablet Take 2 tablets by mouth at bedtime as needed for mild constipation. 11/14/16   Altamese Dilling, MD  spironolactone (ALDACTONE) 50 MG tablet Take 1 tablet (50 mg total) by mouth 2 (two) times daily. 07/09/16   Katharina Caper, MD  sucralfate (CARAFATE) 1 g tablet  Take 1 g by mouth 4 (four) times daily.    Historical Provider, MD    Allergies Patient has no known allergies.  Family History  Problem Relation Age of Onset  . Heart disease Mother   . Hypertension Mother   . Hyperlipidemia Mother   . Stroke Father   . Heart attack Father   . Hypertension Father   . Heart disease Father   . Alcohol abuse Father   . Heart disease Brother     Social History Social History  Substance Use Topics  . Smoking status: Never Smoker  . Smokeless tobacco: Never Used  . Alcohol use 30.0 oz/week    50 Cans of beer per week     Comment: last drink 4-5 days ago    Review of Systems Constitutional: No fever/chills Eyes: No visual changes. ENT: No sore throat. Cardiovascular: Denies chest pain. Respiratory:  Denies shortness of breath. Gastrointestinal: No abdominal pain.  No nausea, no vomiting.  No diarrhea.  No constipation. Genitourinary: Negative for dysuria. Musculoskeletal: Negative for back pain. Skin: Negative for rash. Neurological: Positive for seizure.  10-point ROS otherwise negative.  ____________________________________________   PHYSICAL EXAM:  VITAL SIGNS: ED Triage Vitals  Enc Vitals Group     BP 12/11/16 1915 133/64     Pulse Rate 12/11/16 1915 88     Resp 12/11/16 1915 18     Temp 12/11/16 1915 99.2 F (37.3 C)     Temp Source 12/11/16 1915 Oral     SpO2 12/11/16 1915 (!) 89 %     Weight 12/11/16 1916 (!) 366 lb (166 kg)     Height 12/11/16 1916 6\' 3"  (1.905 m)     Head Circumference --      Peak Flow --      Pain Score --      Pain Loc --      Pain Edu? --      Excl. in GC? --     Constitutional: Morbidly obese speaks in slow sentences pleasant cooperative no respiratory distress no diaphoresis Eyes: PERRL EOMI. Head: Atraumatic. Nose: No congestion/rhinnorhea. Mouth/Throat: No trismus Neck: No stridor.   Cardiovascular: Normal rate, regular rhythm. Grossly normal heart sounds.  Good peripheral circulation. Respiratory: Normal respiratory effort.  No retractions. Lungs CTAB and moving good air Gastrointestinal: Soft nondistended nontender no rebound no guarding no peritonitis no McBurney's tenderness negative Rovsing's no costovertebral tenderness negative Murphy's Musculoskeletal: No lower extremity edema   Neurologic:  Mild asterixis no focal neurological deficits Skin:  Skin is warm, dry and intact. No rash noted. Psychiatric: Mood and affect are normal. Speech and behavior are normal.    ____________________________________________   DIFFERENTIAL  Breakthrough seizure, hyperammonemia, infection, intracerebral hemorrhage, stroke ____________________________________________   LABS (all labs ordered are listed, but only abnormal results are  displayed)  Labs Reviewed  AMMONIA - Abnormal; Notable for the following:       Result Value   Ammonia 90 (*)    All other components within normal limits  BASIC METABOLIC PANEL - Abnormal; Notable for the following:    Glucose, Bld 256 (*)    Calcium 8.6 (*)    All other components within normal limits  HEPATIC FUNCTION PANEL - Abnormal; Notable for the following:    Bilirubin, Direct <0.1 (*)    All other components within normal limits  CBC WITH DIFFERENTIAL/PLATELET - Abnormal; Notable for the following:    RBC 3.85 (*)    Hemoglobin 9.4 (*)  HCT 30.7 (*)    MCV 79.7 (*)    MCH 24.4 (*)    MCHC 30.6 (*)    RDW 21.1 (*)    Platelets 134 (*)    All other components within normal limits  URINALYSIS, COMPLETE (UACMP) WITH MICROSCOPIC - Abnormal; Notable for the following:    Color, Urine AMBER (*)    APPearance CLEAR (*)    Hgb urine dipstick LARGE (*)    Ketones, ur 5 (*)    Protein, ur 100 (*)    Squamous Epithelial / LPF 0-5 (*)    All other components within normal limits  URINE DRUG SCREEN, QUALITATIVE ([facility] ONLY) - Abnormal; Notable for the following:    Tricyclic, Ur Screen POSITIVE (*)    Benzodiazepine, Ur Scrn POSITIVE (*)    All other components within normal limits  TSH  PROTIME-INR  LEVETIRACETAM LEVEL  Slightly elevated ammonia. No signs of infection. __________________________________________  EKG  ED ECG REPORT I, Merrily BrittleNeil Matix Henshaw, the attending physician, personally viewed and interpreted this ECG.  Date: 12/12/2016  Rate: 91 Rhythm: normal sinus rhythm QRS Axis: normal Intervals: normal ST/T Wave abnormalities: normal Conduction Disturbances: none Narrative Interpretation: unremarkable  ____________________________________________  RADIOLOGY  Head CT with no acute disease chest x-ray with no acute disease ____________________________________________   PROCEDURES  Procedure(s) performed: no  Procedures  Critical Care performed:  no  ____________________________________________   INITIAL IMPRESSION / ASSESSMENT AND PLAN / ED COURSE  Pertinent labs & imaging results that were available during my care of the patient were reviewed by me and considered in my medical decision making (see chart for details).  On arrival the patient is well-appearing and hemodynamically stable with a normal neurological exam. He did have 2 generalized tonic-clonic seizures today with seizures yesterday and the day before which is concerning for breakthrough seizure versus section. Labs pending.     ----------------------------------------- 9:19 PM on 12/11/2016 -----------------------------------------  I discussed the case with on-call neurologist Dr. Roseanne RenoStewart who recommended a dose of Keppra 1500 mg today right now and then increasing his dose to 1500 mg twice a day. ____________________________________________   FINAL CLINICAL IMPRESSION(S) / ED DIAGNOSES  Final diagnoses:  Seizure (HCC)      NEW MEDICATIONS STARTED DURING THIS VISIT:  Discharge Medication List as of 12/11/2016  9:22 PM       Note:  This document was prepared using Dragon voice recognition software and may include unintentional dictation errors.     Merrily BrittleNeil Kandas Oliveto, MD 12/12/16 1606

## 2016-12-11 NOTE — ED Notes (Signed)
Patient c/o 3 seizures in the last 2 days, last seizure at 18:10 today

## 2016-12-11 NOTE — ED Notes (Signed)
ED Provider at bedside. 

## 2016-12-11 NOTE — ED Notes (Signed)
Patient transported to CT 

## 2016-12-11 NOTE — ED Triage Notes (Signed)
Pt is detoxing from alcohol on his own and has not had a drink in 8 days. Has had seizures x 3 today hx of the same and also has seizures due to abnormal ammonia levels. Pt has hx of cirrhosis of the liver.

## 2016-12-11 NOTE — ED Notes (Signed)
Patient was seen last week for seizures. Started on new medication. Pt unsure if he is taking it correctly

## 2016-12-12 ENCOUNTER — Inpatient Hospital Stay
Admission: EM | Admit: 2016-12-12 | Discharge: 2016-12-15 | DRG: 100 | Disposition: A | Discharge status: Home Health | Payer: Medicare Other | Attending: Internal Medicine | Admitting: Internal Medicine

## 2016-12-12 DIAGNOSIS — K7030 Alcoholic cirrhosis of liver without ascites: Secondary | ICD-10-CM | POA: Diagnosis present

## 2016-12-12 DIAGNOSIS — E785 Hyperlipidemia, unspecified: Secondary | ICD-10-CM | POA: Diagnosis present

## 2016-12-12 DIAGNOSIS — I10 Essential (primary) hypertension: Secondary | ICD-10-CM | POA: Diagnosis present

## 2016-12-12 DIAGNOSIS — Z6841 Body Mass Index (BMI) 40.0 and over, adult: Secondary | ICD-10-CM

## 2016-12-12 DIAGNOSIS — J449 Chronic obstructive pulmonary disease, unspecified: Secondary | ICD-10-CM | POA: Diagnosis present

## 2016-12-12 DIAGNOSIS — G40909 Epilepsy, unspecified, not intractable, without status epilepticus: Principal | ICD-10-CM | POA: Diagnosis present

## 2016-12-12 DIAGNOSIS — J9690 Respiratory failure, unspecified, unspecified whether with hypoxia or hypercapnia: Secondary | ICD-10-CM | POA: Diagnosis present

## 2016-12-12 DIAGNOSIS — F329 Major depressive disorder, single episode, unspecified: Secondary | ICD-10-CM | POA: Diagnosis present

## 2016-12-12 DIAGNOSIS — R569 Unspecified convulsions: Secondary | ICD-10-CM

## 2016-12-12 DIAGNOSIS — F209 Schizophrenia, unspecified: Secondary | ICD-10-CM | POA: Diagnosis present

## 2016-12-12 DIAGNOSIS — R0602 Shortness of breath: Secondary | ICD-10-CM

## 2016-12-12 DIAGNOSIS — Z9119 Patient's noncompliance with other medical treatment and regimen: Secondary | ICD-10-CM

## 2016-12-12 DIAGNOSIS — Z7984 Long term (current) use of oral hypoglycemic drugs: Secondary | ICD-10-CM

## 2016-12-12 DIAGNOSIS — Z79899 Other long term (current) drug therapy: Secondary | ICD-10-CM

## 2016-12-12 DIAGNOSIS — K7290 Hepatic failure, unspecified without coma: Secondary | ICD-10-CM | POA: Diagnosis present

## 2016-12-12 DIAGNOSIS — Z811 Family history of alcohol abuse and dependence: Secondary | ICD-10-CM

## 2016-12-12 DIAGNOSIS — J9601 Acute respiratory failure with hypoxia: Secondary | ICD-10-CM | POA: Diagnosis present

## 2016-12-12 DIAGNOSIS — E119 Type 2 diabetes mellitus without complications: Secondary | ICD-10-CM | POA: Diagnosis present

## 2016-12-12 DIAGNOSIS — E875 Hyperkalemia: Secondary | ICD-10-CM | POA: Diagnosis present

## 2016-12-12 DIAGNOSIS — Z8249 Family history of ischemic heart disease and other diseases of the circulatory system: Secondary | ICD-10-CM

## 2016-12-12 DIAGNOSIS — Z9981 Dependence on supplemental oxygen: Secondary | ICD-10-CM

## 2016-12-12 LAB — COMPREHENSIVE METABOLIC PANEL
ALBUMIN: 3.5 g/dL (ref 3.5–5.0)
ALK PHOS: 105 U/L (ref 38–126)
ALT: 38 U/L (ref 17–63)
AST: 46 U/L — ABNORMAL HIGH (ref 15–41)
Anion gap: 5 (ref 5–15)
BILIRUBIN TOTAL: 0.6 mg/dL (ref 0.3–1.2)
BUN: 21 mg/dL — AB (ref 6–20)
CALCIUM: 8.6 mg/dL — AB (ref 8.9–10.3)
CO2: 32 mmol/L (ref 22–32)
CREATININE: 1.35 mg/dL — AB (ref 0.61–1.24)
Chloride: 100 mmol/L — ABNORMAL LOW (ref 101–111)
GFR calc Af Amer: >60 mL/min (ref >60)
GFR calc non Af Amer: >60 mL/min (ref >60)
GLUCOSE: 155 mg/dL — AB (ref 65–99)
Potassium: 4.5 mmol/L (ref 3.5–5.1)
SODIUM: 137 mmol/L (ref 135–145)
TOTAL PROTEIN: 7.8 g/dL (ref 6.5–8.1)

## 2016-12-12 LAB — CBC WITH DIFFERENTIAL/PLATELET
BASOS PCT: 1 %
Basophils Absolute: 0.1 10*3/uL (ref 0–0.1)
EOS ABS: 0.2 10*3/uL (ref 0–0.7)
Eosinophils Relative: 2 %
HEMATOCRIT: 30.3 % — AB (ref 40.0–52.0)
HEMOGLOBIN: 9.3 g/dL — AB (ref 13.0–18.0)
LYMPHS ABS: 1.3 10*3/uL (ref 1.0–3.6)
Lymphocytes Relative: 19 %
MCH: 24.4 pg — ABNORMAL LOW (ref 26.0–34.0)
MCHC: 30.7 g/dL — AB (ref 32.0–36.0)
MCV: 79.6 fL — ABNORMAL LOW (ref 80.0–100.0)
MONOS PCT: 13 %
Monocytes Absolute: 0.9 10*3/uL (ref 0.2–1.0)
NEUTROS ABS: 4.7 10*3/uL (ref 1.4–6.5)
NEUTROS PCT: 65 %
Platelets: 139 10*3/uL — ABNORMAL LOW (ref 150–440)
RBC: 3.81 MIL/uL — AB (ref 4.40–5.90)
RDW: 21.1 % — ABNORMAL HIGH (ref 11.5–14.5)
WBC: 7.2 10*3/uL (ref 3.8–10.6)

## 2016-12-12 LAB — TROPONIN I: Troponin I: <0.03 ng/mL (ref <0.03)

## 2016-12-12 LAB — GLUCOSE, CAPILLARY: Glucose-Capillary: 96 mg/dL (ref 65–99)

## 2016-12-12 LAB — AMMONIA: Ammonia: 111 umol/L — ABNORMAL HIGH (ref 9–35)

## 2016-12-12 MED ORDER — FUROSEMIDE 20 MG PO TABS
20.0000 mg | ORAL_TABLET | Freq: Every day | ORAL | Status: DC
  Administered 2016-12-13 – 2016-12-15 (×3): 20 mg via ORAL
  Filled 2016-12-12 (×3): qty 1

## 2016-12-12 MED ORDER — ADULT MULTIVITAMIN W/MINERALS CH
1.0000 | ORAL_TABLET | Freq: Every day | ORAL | Status: DC
  Administered 2016-12-13 – 2016-12-15 (×3): 1 via ORAL
  Filled 2016-12-12 (×3): qty 1

## 2016-12-12 MED ORDER — LACTULOSE 10 GM/15ML PO SOLN
30.0000 g | ORAL | Status: DC
  Administered 2016-12-13 – 2016-12-15 (×12): 30 g via ORAL
  Filled 2016-12-12 (×5): qty 60
  Filled 2016-12-12: qty 30
  Filled 2016-12-12 (×6): qty 60

## 2016-12-12 MED ORDER — TRAZODONE HCL 100 MG PO TABS
100.0000 mg | ORAL_TABLET | Freq: Every day | ORAL | Status: DC
  Administered 2016-12-13 – 2016-12-14 (×3): 100 mg via ORAL
  Filled 2016-12-12 (×3): qty 1

## 2016-12-12 MED ORDER — LOSARTAN POTASSIUM 25 MG PO TABS
25.0000 mg | ORAL_TABLET | Freq: Every day | ORAL | Status: DC
  Administered 2016-12-13 – 2016-12-15 (×3): 25 mg via ORAL
  Filled 2016-12-12 (×3): qty 1

## 2016-12-12 MED ORDER — LACTULOSE 10 GM/15ML PO SOLN
30.0000 g | Freq: Once | ORAL | Status: AC
  Administered 2016-12-12: 30 g via ORAL
  Filled 2016-12-12: qty 60

## 2016-12-12 MED ORDER — NADOLOL 20 MG PO TABS
20.0000 mg | ORAL_TABLET | Freq: Every day | ORAL | Status: DC
  Administered 2016-12-13 – 2016-12-15 (×3): 20 mg via ORAL
  Filled 2016-12-12 (×3): qty 1

## 2016-12-12 MED ORDER — LEVETIRACETAM 500 MG PO TABS
1500.0000 mg | ORAL_TABLET | Freq: Two times a day (BID) | ORAL | Status: DC
  Administered 2016-12-13 – 2016-12-15 (×6): 1500 mg via ORAL
  Filled 2016-12-12 (×6): qty 3

## 2016-12-12 MED ORDER — ACAMPROSATE CALCIUM 333 MG PO TBEC
666.0000 mg | DELAYED_RELEASE_TABLET | Freq: Three times a day (TID) | ORAL | Status: DC
  Administered 2016-12-13 – 2016-12-15 (×8): 666 mg via ORAL
  Filled 2016-12-12 (×9): qty 2

## 2016-12-12 MED ORDER — SUCRALFATE 1 G PO TABS
1.0000 g | ORAL_TABLET | Freq: Four times a day (QID) | ORAL | Status: DC
  Administered 2016-12-13 – 2016-12-15 (×10): 1 g via ORAL
  Filled 2016-12-12 (×10): qty 1

## 2016-12-12 MED ORDER — ONDANSETRON HCL 4 MG PO TABS: 4.0000 mg | ORAL_TABLET | Freq: Four times a day (QID) | ORAL | Status: DC | PRN

## 2016-12-12 MED ORDER — OLANZAPINE 10 MG PO TABS
30.0000 mg | ORAL_TABLET | Freq: Every day | ORAL | Status: DC
  Administered 2016-12-13 – 2016-12-14 (×3): 30 mg via ORAL
  Filled 2016-12-12 (×3): qty 3

## 2016-12-12 MED ORDER — LORAZEPAM 0.5 MG PO TABS
0.5000 mg | ORAL_TABLET | ORAL | Status: DC
  Administered 2016-12-13 – 2016-12-15 (×12): 0.5 mg via ORAL
  Filled 2016-12-12 (×13): qty 1

## 2016-12-12 MED ORDER — DOCUSATE SODIUM 100 MG PO CAPS
100.0000 mg | ORAL_CAPSULE | Freq: Two times a day (BID) | ORAL | Status: DC
  Administered 2016-12-13 – 2016-12-15 (×6): 100 mg via ORAL
  Filled 2016-12-12 (×6): qty 1

## 2016-12-12 MED ORDER — ONDANSETRON HCL 4 MG/2ML IJ SOLN
4.0000 mg | Freq: Four times a day (QID) | INTRAMUSCULAR | Status: DC | PRN
  Administered 2016-12-13: 4 mg via INTRAVENOUS
  Filled 2016-12-12: qty 2

## 2016-12-12 MED ORDER — ENOXAPARIN SODIUM 40 MG/0.4ML ~~LOC~~ SOLN
40.0000 mg | Freq: Two times a day (BID) | SUBCUTANEOUS | Status: DC
  Administered 2016-12-13 – 2016-12-15 (×5): 40 mg via SUBCUTANEOUS
  Filled 2016-12-12 (×5): qty 0.4

## 2016-12-12 MED ORDER — SENNOSIDES-DOCUSATE SODIUM 8.6-50 MG PO TABS: 2.0000 | ORAL_TABLET | Freq: Every evening | ORAL | Status: DC | PRN

## 2016-12-12 MED ORDER — SODIUM CHLORIDE 0.9 % IV SOLN
INTRAVENOUS | Status: DC
  Administered 2016-12-13 – 2016-12-14 (×3): via INTRAVENOUS

## 2016-12-12 MED ORDER — ACETAMINOPHEN 325 MG PO TABS
650.0000 mg | ORAL_TABLET | Freq: Four times a day (QID) | ORAL | Status: DC | PRN
  Administered 2016-12-13 (×2): 650 mg via ORAL
  Filled 2016-12-12 (×2): qty 2

## 2016-12-12 MED ORDER — SPIRONOLACTONE 25 MG PO TABS
50.0000 mg | ORAL_TABLET | Freq: Two times a day (BID) | ORAL | Status: DC
  Administered 2016-12-13 – 2016-12-15 (×6): 50 mg via ORAL
  Filled 2016-12-12 (×6): qty 2

## 2016-12-12 MED ORDER — LORAZEPAM 2 MG/ML IJ SOLN: 2.0000 mg | INTRAMUSCULAR | Status: DC | PRN

## 2016-12-12 MED ORDER — CITALOPRAM HYDROBROMIDE 20 MG PO TABS
20.0000 mg | ORAL_TABLET | ORAL | Status: DC
  Administered 2016-12-13 – 2016-12-15 (×3): 20 mg via ORAL
  Filled 2016-12-12 (×3): qty 1

## 2016-12-12 MED ORDER — ACETAMINOPHEN 650 MG RE SUPP: 650.0000 mg | Freq: Four times a day (QID) | RECTAL | Status: DC | PRN

## 2016-12-12 MED ORDER — MODAFINIL 100 MG PO TABS
100.0000 mg | ORAL_TABLET | Freq: Every day | ORAL | Status: DC
  Administered 2016-12-13 – 2016-12-15 (×3): 100 mg via ORAL
  Filled 2016-12-12 (×3): qty 1

## 2016-12-12 MED ORDER — SODIUM CHLORIDE 0.9% FLUSH
3.0000 mL | Freq: Two times a day (BID) | INTRAVENOUS | Status: DC
  Administered 2016-12-13 – 2016-12-15 (×4): 3 mL via INTRAVENOUS

## 2016-12-12 MED ORDER — LORAZEPAM 2 MG/ML IJ SOLN
1.0000 mg | Freq: Once | INTRAMUSCULAR | Status: AC
  Administered 2016-12-12: 1 mg via INTRAVENOUS
  Filled 2016-12-12: qty 1

## 2016-12-12 MED ORDER — RIFAXIMIN 550 MG PO TABS
550.0000 mg | ORAL_TABLET | Freq: Two times a day (BID) | ORAL | Status: DC
  Administered 2016-12-13 – 2016-12-15 (×6): 550 mg via ORAL
  Filled 2016-12-12 (×6): qty 1

## 2016-12-12 MED ORDER — PANTOPRAZOLE SODIUM 40 MG PO TBEC
40.0000 mg | DELAYED_RELEASE_TABLET | Freq: Two times a day (BID) | ORAL | Status: DC
  Administered 2016-12-13 – 2016-12-15 (×5): 40 mg via ORAL
  Filled 2016-12-12 (×5): qty 1

## 2016-12-12 NOTE — ED Provider Notes (Signed)
[facility] Emergency Department Provider Note        Time seen: ----------------------------------------- 7:52 PM on 12/12/2016 -----------------------------------------    [facility] vital signs and the nursing notes.   HISTORY   Chief Complaint Seizures    HPI Howard [name] is a 36 y.o. male who presents to the ED for seizures. Mom states he had 2 seizures today and that this was the same as his normal seizures but they lasted much longer. Mother estimates his second seizure lasted a minute and a half. Patient was just seen yesterday had an increase in his seizure medication but this has not improved his seizure events. Patient also states he feels more lethargic today and confused after the seizures. They deny any recent illness.   Past Medical History:  Diagnosis Date  . Alcoholic cirrhosis of liver with ascites (HCC)   . Anemia   . COPD (chronic obstructive pulmonary disease) (HCC)   . Depression   . Diabetes (HCC)   . Diabetes mellitus, type II (HCC)   . Esophageal varices (HCC)   . Heart disease   . Hyperlipemia   . Hypertension   . Liver disease   . Multiple thyroid nodules   . Portal hypertensive gastropathy   . Schizophrenia (HCC)   . Seizures [facility](HCC)     Patient Active Problem List   Diagnosis Date Noted  . GIB (gastrointestinal bleeding) 10/05/2016  . Ascites 10/04/2016  . DNR (do not resuscitate) discussion 08/11/2016  . Palliative care by specialist 08/11/2016  . Muscle weakness (generalized)   . Acute upper GI bleed   . GI bleed 08/07/2016  . Alcoholic cirrhosis of liver (HCC) 07/09/2016  . Acute respiratory failure with hypercapnia (HCC) 07/09/2016  . Hyperglycemia 07/09/2016  . Seizure (HCC) 06/10/2016  . Altered mental status 05/12/2016  . Headache 04/29/2016  . OSA (obstructive sleep apnea) 04/29/2016  . Anemia 04/29/2016  . Thrombocytopenia (HCC) 04/29/2016  . Coagulopathy (HCC) 04/29/2016  .  Obesity 04/29/2016  . Generalized weakness 04/29/2016  . Acute hepatic encephalopathy 03/14/2016  . Controlled type 2 diabetes mellitus without complication (HCC) 01/15/2016  . Normocytic anemia 12/12/2015  . Essential (primary) hypertension 12/11/2015  . Pure hypercholesterolemia 12/11/2015  . Fever 11/11/2015  . Ascites due to alcoholic cirrhosis (HCC) 11/11/2015  . Cirrhosis of liver with ascites (HCC) 11/11/2015  . Hepatic encephalopathy (HCC) 10/16/2015  . Type 2 diabetes mellitus (HCC) 10/16/2015  . Acute renal failure (ARF) (HCC) 07/30/2015  . 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 Alcoholic cirrhosis (HCC) 07/19/2015  . Hypoxia 07/04/2015  . Elevated transaminase level 07/04/2015  . DOE (dyspnea on exertion) 07/04/2015  . Alcohol abuse 05/31/2015  . Hypertension 05/29/2015  . Paranoid schizophrenia (HCC) 03/20/2015    Past Surgical History:  Procedure Laterality Date  . ESOPHAGOGASTRODUODENOSCOPY N/A 10/05/2015   Procedure: ESOPHAGOGASTRODUODENOSCOPY (EGD);  Surgeon: Elnita MaxwellMatthew Gordon Rein, MD;  [facility];  Service: Endoscopy;  Laterality: N/A;  . ESOPHAGOGASTRODUODENOSCOPY (EGD) WITH PROPOFOL N/A 08/03/2015   Procedure: ESOPHAGOGASTRODUODENOSCOPY (EGD) WITH PROPOFOL;  Surgeon: Elnita MaxwellMatthew Gordon Rein, MD;  [facility];  Service: Endoscopy;  Laterality: N/A;  . ESOPHAGOGASTRODUODENOSCOPY (EGD) WITH PROPOFOL N/A 08/31/2015   Procedure: ESOPHAGOGASTRODUODENOSCOPY (EGD) WITH PROPOFOL;  Surgeon: Elnita MaxwellMatthew Gordon Rein, MD;  [facility];  Service: Endoscopy;  Laterality: N/A;  . ESOPHAGOGASTRODUODENOSCOPY (EGD) WITH PROPOFOL N/A 04/04/2016   Procedure: ESOPHAGOGASTRODUODENOSCOPY (EGD) WITH PROPOFOL;  Surgeon: Scot Junobert T Elliott, MD;  [facility];  Service: Endoscopy;  Laterality: N/A;  . ESOPHAGOGASTRODUODENOSCOPY (EGD) WITH PROPOFOL N/A 11/13/2016   Procedure: ESOPHAGOGASTRODUODENOSCOPY (EGD) WITH PROPOFOL;  Surgeon:  Wyline Mood, MD;  Location: [facility] ENDOSCOPY;  Service: Endoscopy;  Laterality: N/A;  . NO PAST SURGERIES      Allergies Patient has no known allergies.  Social History Social History  Substance Use Topics  . Smoking status: Never Smoker  . Smokeless tobacco: Never Used  . Alcohol use 30.0 oz/week    50 Cans of beer per week     Comment: last drink 4-5 days ago    Review of Systems Constitutional: Negative for fever. Cardiovascular: Negative for chest pain. Respiratory: Positive for chronic shortness of breath Gastrointestinal: Negative for abdominal pain, vomiting and diarrhea. Musculoskeletal: Negative for back pain. Skin: Negative for rash. Neurological: Negative for headaches, positive for generalized weakness  10-point ROS otherwise negative.  ____________________________________________   PHYSICAL EXAM:  VITAL SIGNS: ED Triage Vitals [12/12/16 1924]  Enc Vitals Group     BP      Pulse      Resp      Temp      Temp src      SpO2      Weight (!) 366 lb (166 kg)     Height 6\' 3"  (1.905 m)     Head Circumference      Peak Flow      Pain Score 7     Pain Loc      Pain Edu?      Excl. in GC?    Constitutional: Alert and oriented. Chronically ill-appearing, in no distress. Morbidly obese Eyes: Conjunctivae are normal. PERRL. Normal extraocular movements. ENT   Head: Normocephalic and atraumatic.   Nose: No congestion/rhinnorhea.   Mouth/Throat: Mucous membranes are moist.   Neck: No stridor. Cardiovascular: Normal rate, regular rhythm. No murmurs, rubs, or gallops. Respiratory: Normal respiratory effort without tachypnea nor retractions. Breath sounds are clear and equal bilaterally. No wheezes/rales/rhonchi. Gastrointestinal: Soft and nontender. Normal bowel sounds Musculoskeletal: Nontender with normal range of motion in extremities. Edema is noted Neurologic:  Normal speech and language. No gross focal neurologic deficits are appreciated.   Skin:  Skin is warm, dry and intact. No rash noted. Psychiatric: Flat affect ____________________________________________  EKG: Interpreted by me. Sinus rhythm rate of 74 bpm, normal PR interval, normal QRS size, normal QT, nonspecific ST segment changes. No ST elevation.  ____________________________________________  ED COURSE:  Pertinent labs & imaging results that were available during my care of the patient were reviewed by me and considered in my medical decision making (see chart for details). Patient presents for seizures, we will assess with labs and imaging if needed. Patient may require admission due to seizures despite increasing his seizure medicines yesterday.   Procedures ____________________________________________   LABS (pertinent positives/negatives)  Labs Reviewed  CBC WITH DIFFERENTIAL/PLATELET - Abnormal; Notable for the following:       Result Value   RBC 3.81 (*)    Hemoglobin 9.3 (*)    HCT 30.3 (*)    MCV 79.6 (*)    MCH 24.4 (*)    MCHC 30.7 (*)    RDW 21.1 (*)    Platelets 139 (*)    All other components within normal limits  COMPREHENSIVE METABOLIC PANEL - Abnormal; Notable for the following:    Chloride 100 (*)    Glucose, Bld 155 (*)    BUN 21 (*)    Creatinine, Ser 1.35 (*)  Calcium 8.6 (*)    AST 46 (*)    All other components within normal limits  AMMONIA - Abnormal; Notable for the following:    Ammonia 111 (*)    All other components within normal limits  TROPONIN I  CBG MONITORING, ED  ____________________________________________  FINAL ASSESSMENT AND PLAN  Seizures, Hyperammonemia  Plan: Patient's labs and imaging were dictated above. Patient had presented for seizures that are uncontrolled despite maximum antiepileptic medication. His ammonia level is also increasing. We will give additional lactulose here as well as IV Ativan. I will discuss with the hospitalist for admission.   Emily Filbert, MD   Note: This  note was generated in part or whole with voice recognition software. Voice recognition is usually quite accurate but there are transcription errors that can and very often do occur. I apologize for any typographical errors that were not detected and corrected.     Emily Filbert, MD 12/12/16 [phone]

## 2016-12-12 NOTE — Progress Notes (Signed)
Lovenox changed to 40 mg BID for BMI >40 and CrCl >30. 

## 2016-12-12 NOTE — ED Triage Notes (Signed)
Pt reports to ED w/ c/o seizures x 2 today.  Pt sts that this was same movements as his normal seizures, lasting longer though.  Pts mother estimates 2nd seizure 7013m 20 sec.  Pt sts that he has been taking his medication as prescribed.  Pt also sts that he feel lethargic today, a little confused after seizures.  Pt able to answer questions, A/OX4.

## 2016-12-13 ENCOUNTER — Observation Stay: Payer: Medicare Other

## 2016-12-13 DIAGNOSIS — R569 Unspecified convulsions: Secondary | ICD-10-CM

## 2016-12-13 LAB — COMPREHENSIVE METABOLIC PANEL
ALK PHOS: 101 U/L (ref 38–126)
ALT: 40 U/L (ref 17–63)
AST: 49 U/L — AB (ref 15–41)
Albumin: 3.5 g/dL (ref 3.5–5.0)
Anion gap: 5 (ref 5–15)
BUN: 22 mg/dL — AB (ref 6–20)
CALCIUM: 8.5 mg/dL — AB (ref 8.9–10.3)
CHLORIDE: 102 mmol/L (ref 101–111)
CO2: 31 mmol/L (ref 22–32)
CREATININE: 1.2 mg/dL (ref 0.61–1.24)
Glucose, Bld: 144 mg/dL — ABNORMAL HIGH (ref 65–99)
Potassium: 5.4 mmol/L — ABNORMAL HIGH (ref 3.5–5.1)
Sodium: 138 mmol/L (ref 135–145)
Total Bilirubin: 0.7 mg/dL (ref 0.3–1.2)
Total Protein: 7.6 g/dL (ref 6.5–8.1)

## 2016-12-13 LAB — GLUCOSE, CAPILLARY
Glucose-Capillary: 107 mg/dL — ABNORMAL HIGH (ref 65–99)
Glucose-Capillary: 119 mg/dL — ABNORMAL HIGH (ref 65–99)

## 2016-12-13 LAB — CBC
HCT: 30.4 % — ABNORMAL LOW (ref 40.0–52.0)
HEMOGLOBIN: 9.3 g/dL — AB (ref 13.0–18.0)
MCH: 24.5 pg — AB (ref 26.0–34.0)
MCHC: 30.4 g/dL — ABNORMAL LOW (ref 32.0–36.0)
MCV: 80.5 fL (ref 80.0–100.0)
Platelets: 135 10*3/uL — ABNORMAL LOW (ref 150–440)
RBC: 3.78 MIL/uL — AB (ref 4.40–5.90)
RDW: 21.6 % — ABNORMAL HIGH (ref 11.5–14.5)
WBC: 7.8 10*3/uL (ref 3.8–10.6)

## 2016-12-13 LAB — POTASSIUM: POTASSIUM: 5 mmol/L (ref 3.5–5.1)

## 2016-12-13 LAB — AMMONIA: AMMONIA: 78 umol/L — AB (ref 9–35)

## 2016-12-13 LAB — TSH: TSH: 0.948 u[IU]/mL (ref 0.350–4.500)

## 2016-12-13 MED ORDER — SODIUM POLYSTYRENE SULFONATE 15 GM/60ML PO SUSP
15.0000 g | Freq: Once | ORAL | Status: AC
  Administered 2016-12-13: 15 g via ORAL
  Filled 2016-12-13: qty 60

## 2016-12-13 MED ORDER — INSULIN ASPART 100 UNIT/ML ~~LOC~~ SOLN
0.0000 [IU] | Freq: Three times a day (TID) | SUBCUTANEOUS | Status: DC
  Administered 2016-12-14: 12:00:00 1 [IU] via SUBCUTANEOUS
  Filled 2016-12-13: qty 1

## 2016-12-13 MED ORDER — TRAMADOL HCL 50 MG PO TABS
50.0000 mg | ORAL_TABLET | Freq: Three times a day (TID) | ORAL | Status: DC | PRN
  Administered 2016-12-13 – 2016-12-14 (×3): 50 mg via ORAL
  Filled 2016-12-13 (×3): qty 1

## 2016-12-13 MED ORDER — INSULIN ASPART 100 UNIT/ML ~~LOC~~ SOLN: 0.0000 [IU] | Freq: Every day | SUBCUTANEOUS | Status: DC

## 2016-12-13 MED ORDER — ALBUTEROL SULFATE (2.5 MG/3ML) 0.083% IN NEBU: 2.5000 mg | INHALATION_SOLUTION | RESPIRATORY_TRACT | Status: DC | PRN

## 2016-12-13 NOTE — Progress Notes (Addendum)
[facility] [place] [facility]   PATIENT NAME: Howard [name]    MR#:  [id number]  DATE OF BIRTH:  1981/06/07  SUBJECTIVE:  CHIEF COMPLAINT:  Pt was hypoxic needing 6 lit of o2 today, pt lives on 2 lit chronically Admits h/o cpap, noncompliant with cpap machine and has ch copd; last alcohol intake was 9 days ago Denies any shortness of breath or chest tightness  REVIEW OF SYSTEMS:  CONSTITUTIONAL: No fever, fatigue or weakness.  EYES: No blurred or double vision.  EARS, NOSE, AND THROAT: No tinnitus or ear pain.  RESPIRATORY: No cough, shortness of breath, wheezing or hemoptysis.  CARDIOVASCULAR: No chest pain, orthopnea, edema.  GASTROINTESTINAL: No nausea, vomiting, diarrhea or abdominal pain.  GENITOURINARY: No dysuria, hematuria.  ENDOCRINE: No polyuria, nocturia,  HEMATOLOGY: No anemia, easy bruising or bleeding SKIN: No rash or lesion. MUSCULOSKELETAL: No joint pain or arthritis.   NEUROLOGIC: No tingling, numbness, weakness.  PSYCHIATRY: No anxiety or depression.   DRUG ALLERGIES:  No Known Allergies  VITALS:  Blood pressure (!) 138/55, pulse 95, temperature 99.2 F (37.3 C), temperature source Oral, resp. rate 20, height 6\' 3"  (1.905 m), weight (!) 165.4 kg (364 lb 11.2 oz), SpO2 90 %.  PHYSICAL EXAMINATION:  GENERAL:  36 y.o.-year-old patient lying in the bed with no acute distress.  EYES: Pupils equal, round, reactive to light and accommodation. No scleral icterus. Extraocular muscles intact.  HEENT: Head atraumatic, normocephalic. Oropharynx and nasopharynx clear.  NECK:  Supple, no jugular venous distention. No thyroid enlargement, no tenderness.  LUNGS: Moderately diminished breath sounds bilaterally, no wheezing, rales,rhonchi or crepitation. No use of accessory muscles of respiration.  CARDIOVASCULAR: S1, S2 normal. No murmurs, rubs, or gallops.  ABDOMEN: Soft, nontender, mod distended with ascites . Bowel sounds present. No  organomegaly or mass.  EXTREMITIES: No pedal edema, cyanosis, or clubbing.  NEUROLOGIC: Cranial nerves II through XII are intact. Muscle strength 5/5 in all extremities. Sensation intact. Gait not checked.  PSYCHIATRIC: The patient is alert and oriented x 3.  SKIN: No obvious rash, lesion, or ulcer.    LABORATORY PANEL:   CBC  Recent Labs Lab 12/13/16 1002  WBC 7.8  HGB 9.3*  HCT 30.4*  PLT 135*   ------------------------------------------------------------------------------------------------------------------  Chemistries   Recent Labs Lab 12/12/16 1938  NA 137  K 4.5  CL 100*  CO2 32  GLUCOSE 155*  BUN 21*  CREATININE 1.35*  CALCIUM 8.6*  AST 46*  ALT 38  ALKPHOS 105  BILITOT 0.6   ------------------------------------------------------------------------------------------------------------------  Cardiac Enzymes  Recent Labs Lab 12/12/16 1938  TROPONINI <0.03   ------------------------------------------------------------------------------------------------------------------  RADIOLOGY:  Dg Chest 2 View  Result Date: 12/13/2016 CLINICAL DATA:  Shortness of breath. EXAM: CHEST  2 VIEW COMPARISON:  December 11, 2016 FINDINGS: There is atelectatic change in the left base. Lungs elsewhere are clear. Heart size and pulmonary vascularity are normal. No adenopathy. No bone lesions. IMPRESSION: Left base atelectasis.  No edema or consolidation. Electronically Signed   By: Bretta Bang III M.D.   On: 12/13/2016 09:50   Ct Head Wo Contrast  Result Date: 12/11/2016 CLINICAL DATA:  Detoxing from alcohol seizures EXAM: CT HEAD WITHOUT CONTRAST TECHNIQUE: Contiguous axial images were obtained from the base of the skull through the vertex without intravenous contrast. COMPARISON:  09/21/2016 FINDINGS: Brain: No evidence of acute infarction, hemorrhage, hydrocephalus, extra-axial collection or mass lesion/mass effect. Vascular: No hyperdense vessel or unexpected  calcification. Skull: Normal. Negative for fracture  or focal lesion. Sinuses/Orbits: No acute finding. Other: None IMPRESSION: No CT evidence for acute intracranial abnormality. Electronically Signed   By: Jasmine PangKim  Fujinaga M.D.   On: 12/11/2016 20:08   Dg Chest Port 1 View  Result Date: 12/11/2016 CLINICAL DATA:  Fever and cough for 2-3 days EXAM: PORTABLE CHEST 1 VIEW COMPARISON:  10/05/2016 FINDINGS: There is borderline cardiomegaly. Patchy atelectasis or small infiltrate at the left base. No pneumothorax. IMPRESSION: 1. Mild cardiomegaly with central vascular congestion 2. Patchy atelectasis or infiltrate at the left lung base. Electronically Signed   By: Jasmine PangKim  Fujinaga M.D.   On: 12/11/2016 20:26    EKG:   Orders placed or performed during the hospital encounter of 12/12/16  . ED EKG  . ED EKG    ASSESSMENT AND PLAN:    This is a 36 year old male admitted for epilepsy.  #Acute hypoxic respiratory failure could be from sleep apnea related apneic Spells/narcolepsy/morbid obesity/pickwickian syndrome Chest x-ray negative Encouraged to use CPAP Oxygen via nasal cannula and wean off as tolerated Albuterol as needed given the history of COPD with no exacerbations Incentive spirometry  # Breakthrough seizures with chronic history of seizures Unclear if seizures are organic.  No other episodes of seizures since patient is admitted to the hospital  oral Ativan to the patient's medication regimen.  Continue Keppra by mouth twice a day Pending  neurology consult   #. Alcoholic cirrhosis of liver: Elevated ammonia; may be contributing to seizure activity. Continue lactulose as well as nadolol. Lasix and rifaximin for ascites. -Patient is not encephalopathic, ammonia level 111--78 -ciwa  #Hyperkalemia will provide Kayexalate  #. Essential hypertension: Controlled; continue losartan and spironolactone  #  Diabetes mellitus type 2: Hold oral hypoglycemic agents. Sliding scale insulin while  hospitalized.  #. Morbid obesity: BMI 48.5; encourage healthy diet and exercise  #. Schizophrenia:  Continue Celexa, trazodone and Zyprexa. #. DVT prophylaxis: Lovenox #. GI prophylaxis: Pantoprazole per home regimen    All the records are reviewed and case discussed with Care Management/Social Workerr. Management plans discussed with the patient, family and they are in agreement.  CODE STATUS: fc   TOTAL TIME TAKING CARE OF THIS PATIENT: 38  minutes.   POSSIBLE D/C IN 2  DAYS, DEPENDING ON CLINICAL CONDITION.  Note: This dictation was prepared with Dragon dictation along with smaller phrase technology. Any transcriptional errors that result from this process are unintentional.   Ramonita LabGouru, Nur Rabold M.D on 12/13/2016 at 10:42 AM  Between 7am to 6pm - Pager - [phone] After 6pm go to [email or web address] - password [email or web address]  [facility]  Office  [phone]  CC: Primary care physician; Marisue IvanLINTHAVONG, [name], MD

## 2016-12-13 NOTE — Consult Note (Addendum)
Reason for Consult:Seizures Referring Physician: Gouru  CC: Seizures  HPI: Howard [name] is an 36 y.o. male with a history of cirrhosis, ETOH abuse and seizures.  Patient followed by Dr. Malvin Johns on an outpatient basis.  Reports having two seizures on the day prior to presentation and 2 seizures on yesterday as well.  Mother reports that the seizures were becoming longer and he was taking longer to recover.  Patient on Keppra at 1500mg  BID but unclear if patient is compliant.  Ammonia elevated on admission.   Reports stopped drinking 9 days ago.  Past Medical History:  Diagnosis Date  . Alcoholic cirrhosis of liver with ascites (HCC)   . Anemia   . COPD (chronic obstructive pulmonary disease) (HCC)   . Depression   . Diabetes (HCC)   . Diabetes mellitus, type II (HCC)   . Esophageal varices (HCC)   . Heart disease   . Hyperlipemia   . Hypertension   . Liver disease   . Multiple thyroid nodules   . Portal hypertensive gastropathy   . Schizophrenia (HCC)   . Seizures (HCC)     Past Surgical History:  Procedure Laterality Date  . ESOPHAGOGASTRODUODENOSCOPY N/A 10/05/2015   Procedure: ESOPHAGOGASTRODUODENOSCOPY (EGD);  Surgeon: Elnita Maxwell, MD;  Location[facility] ENDOSCOPY;  Service: Endoscopy;  Laterality: N/A;  . ESOPHAGOGASTRODUODENOSCOPY (EGD) WITH PROPOFOL N/A 08/03/2015   Procedure: ESOPHAGOGASTRODUODENOSCOPY (EGD) WITH PROPOFOL;  Surgeon: Elnita Maxwell, MD;  Location: [facility] ENDOSCOPY;  Service: Endoscopy;  Laterality: N/A;  . ESOPHAGOGASTRODUODENOSCOPY (EGD) WITH PROPOFOL N/A 08/31/2015   Procedure: ESOPHAGOGASTRODUODENOSCOPY (EGD) WITH PROPOFOL;  Surgeon: Elnita Maxwell, MD;  Location: [facility] ENDOSCOPY;  Service: Endoscopy;  Laterality: N/A;  . ESOPHAGOGASTRODUODENOSCOPY (EGD) WITH PROPOFOL N/A 04/04/2016   Procedure: ESOPHAGOGASTRODUODENOSCOPY (EGD) WITH PROPOFOL;  Surgeon: Scot Jun, MD;  Location: [facility] ENDOSCOPY;  Service: Endoscopy;  Laterality: N/A;  .  ESOPHAGOGASTRODUODENOSCOPY (EGD) WITH PROPOFOL N/A 11/13/2016   Procedure: ESOPHAGOGASTRODUODENOSCOPY (EGD) WITH PROPOFOL;  Surgeon: Wyline Mood, MD;  Location: [facility] ENDOSCOPY;  Service: Endoscopy;  Laterality: N/A;  . NO PAST SURGERIES      Family History  Problem Relation Age of Onset  . Heart disease Mother   . Hypertension Mother   . Hyperlipidemia Mother   . Stroke Father   . Heart attack Father   . Hypertension Father   . Heart disease Father   . Alcohol abuse Father   . Heart disease Brother     Social History:  reports that he has never smoked. He has never used smokeless tobacco. He reports that he drinks about 30.0 oz of alcohol per week . He reports that he does not use drugs.  No Known Allergies  Medications:  I have reviewed the patient's current medications. Prior to Admission:  Prescriptions Prior to Admission  Medication Sig Dispense Refill Last Dose  . acamprosate (CAMPRAL) 333 MG tablet Take 2 tablets (666 mg total) by mouth 3 (three) times daily with meals. 180 tablet 5 12/12/2016 at am  . albuterol (PROVENTIL HFA;VENTOLIN HFA) 108 (90 Base) MCG/ACT inhaler Inhale 2 puffs into the lungs every 6 (six) hours as needed for wheezing or shortness of breath. 1 Inhaler 2 prn at PRN  . citalopram (CELEXA) 20 MG tablet Take 1 tablet (20 mg total) by mouth every morning. 30 tablet 5 12/12/2016 at AM  . lactulose (CHRONULAC) 10 GM/15ML solution Take 45 mLs (30 g total) by mouth every 4 (four) hours. 240 mL 0 12/12/2016 at AM  . levETIRAcetam (  KEPPRA) 750 MG tablet Take 2 tablets (1,500 mg total) by mouth 2 (two) times daily. 120 tablet 0 12/12/2016 at am  . losartan (COZAAR) 25 MG tablet Take 25 mg by mouth daily.   12/12/2016 at AM  . metFORMIN (GLUCOPHAGE) 500 MG tablet Take 1 tablet (500 mg total) by mouth 2 (two) times daily with a meal. 60 tablet 0 12/12/2016 at am  . Multiple Vitamin (MULTIVITAMIN WITH MINERALS) TABS tablet Take 1 tablet by mouth daily. 30 tablet 0 12/12/2016  at AM  . nadolol (CORGARD) 20 MG tablet Take 20 mg by mouth daily.   12/12/2016 at am  . OLANZapine (ZYPREXA) 15 MG tablet Take 2 tablets (30 mg total) by mouth at bedtime. 60 tablet 5 12/11/2016 at qhs  . ondansetron (ZOFRAN) 4 MG tablet Take 1 tablet (4 mg total) by mouth every 8 (eight) hours as needed for nausea or vomiting. 10 tablet 0 prn at prn  . rifaximin (XIFAXAN) 550 MG TABS tablet Take 1 tablet (550 mg total) by mouth 2 (two) times daily. 60 tablet 5 12/12/2016 at am  . spironolactone (ALDACTONE) 50 MG tablet Take 1 tablet (50 mg total) by mouth 2 (two) times daily. 60 tablet 5 12/12/2016 at am  . sucralfate (CARAFATE) 1 g tablet Take 1 g by mouth 4 (four) times daily.   12/12/2016 at am  . traZODone (DESYREL) 100 MG tablet Take 100 mg by mouth at bedtime.   12/11/2016 at qhs  . furosemide (LASIX) 20 MG tablet Take 1 tablet (20 mg total) by mouth daily. (Patient not taking: Reported on 12/12/2016) 30 tablet 0 Not Taking at Unknown time  . modafinil (PROVIGIL) 100 MG tablet Take 1 tablet (100 mg total) by mouth daily. (Patient not taking: Reported on 12/12/2016) 30 tablet 0 Not Taking at Unknown time  . pantoprazole (PROTONIX) 40 MG tablet Take 1 tablet (40 mg total) by mouth 2 (two) times daily before a meal. (Patient not taking: Reported on 12/12/2016) 60 tablet 1 Not Taking at Unknown time  . senna-docusate (SENOKOT-S) 8.6-50 MG tablet Take 2 tablets by mouth at bedtime as needed for mild constipation. (Patient not taking: Reported on 12/12/2016) 20 tablet 0 Not Taking at Unknown time   Scheduled: . acamprosate  666 mg Oral TID WC  . citalopram  20 mg Oral [id number]  . docusate sodium  100 mg Oral BID  . enoxaparin (LOVENOX) injection  40 mg Subcutaneous BID  . furosemide  20 mg Oral Daily  . lactulose  30 g Oral Q4H  . levETIRAcetam  1,500 mg Oral BID  . LORazepam  0.5 mg Oral Q4H  . losartan  25 mg Oral Daily  . modafinil  100 mg Oral Daily  . multivitamin with minerals  1 tablet Oral  Daily  . nadolol  20 mg Oral Daily  . OLANZapine  30 mg Oral QHS  . pantoprazole  40 mg Oral BID AC  . rifaximin  550 mg Oral BID  . sodium chloride flush  3 mL Intravenous Q12H  . spironolactone  50 mg Oral BID  . sucralfate  1 g Oral QID  . traZODone  100 mg Oral QHS    ROS: History obtained from mother  General ROS: lethargy Psychological ROS: negative for - behavioral disorder, hallucinations, memory difficulties, mood swings or suicidal ideation Ophthalmic ROS: negative for - blurry vision, double vision, eye pain or loss of vision ENT ROS: negative for - epistaxis, nasal discharge, oral lesions, sore throat,  tinnitus or vertigo Allergy and Immunology ROS: negative for - hives or itchy/watery eyes Hematological and Lymphatic ROS: negative for - bleeding problems, bruising or swollen lymph nodes Endocrine ROS: negative for - galactorrhea, hair pattern changes, polydipsia/polyuria or temperature intolerance Respiratory ROS: negative for - cough, hemoptysis, shortness of breath or wheezing Cardiovascular ROS: negative for - chest pain, dyspnea on exertion, edema or irregular heartbeat Gastrointestinal ROS: negative for - abdominal pain, diarrhea, hematemesis, nausea/vomiting or stool incontinence Genito-Urinary ROS: negative for - dysuria, hematuria, incontinence or urinary frequency/urgency Musculoskeletal ROS: negative for - joint swelling or muscular weakness Neurological ROS: as noted in HPI Dermatological ROS: negative for rash and skin lesion changes  Physical Examination: Blood pressure (!) 138/55, pulse 95, temperature 99.2 F (37.3 C), temperature source Oral, resp. rate 20, height 6\' 3"  (1.905 m), weight (!) 165.4 kg (364 lb 11.2 oz), SpO2 90 %.  HEENT-  Normocephalic, no lesions, without obvious abnormality.  Normal external eye and conjunctiva.  Normal TM's bilaterally.  Normal auditory canals and external ears. Normal external nose, mucus membranes and septum.  Normal  pharynx. Cardiovascular- S1, S2 normal, pulses palpable throughout   Lungs- chest clear, no wheezing, rales, normal symmetric air entry Abdomen- soft, non-tender; bowel sounds normal; no masses,  no organomegaly Extremities- no edema Lymph-no adenopathy palpable Musculoskeletal-no joint tenderness, deformity or swelling Skin-warm and dry, no hyperpigmentation, vitiligo, or suspicious lesions  Neurological Examination   Mental Status: Lethargic but oriented.  Speech fluent without evidence of aphasia.  Able to follow 3 step commands without difficulty. Cranial Nerves: II: Discs flat bilaterally; Visual fields grossly normal, pupils equal, round, reactive to light and accommodation III,IV, VI: ptosis not present, extra-ocular motions intact bilaterally V,VII: smile symmetric, facial light touch sensation normal bilaterally VIII: hearing normal bilaterally IX,X: gag reflex present XI: bilateral shoulder shrug XII: midline tongue extension Motor: Right : Upper extremity   5/5    Left:     Upper extremity   5/5  Lower extremity   5/5     Lower extremity   5/5 Tone and bulk:normal tone throughout; no atrophy noted Sensory: Pinprick and light touch intact throughout, bilaterally Deep Tendon Reflexes: 1+ and symmetric throughout Plantars: Right: mute   Left: mute Cerebellar: normal finger-to-nose testing Gait: not tested due to safety concerns    Laboratory Studies:   Basic Metabolic Panel:  Recent Labs Lab 12/06/16 2036 12/07/16 1839 12/11/16 1934 12/12/16 1938 12/13/16 1002  NA 139 136 137 137 138  K 4.0 4.1 4.6 4.5 5.4*  CL 101 100* 101 100* 102  CO2 32 32 31 32 31  GLUCOSE 128* 191* 256* 155* 144*  BUN 19 18 19  21* 22*  CREATININE 1.00 0.78 0.86 1.35* 1.20  CALCIUM 9.3 8.6* 8.6* 8.6* 8.5*    Liver Function Tests:  Recent Labs Lab 12/06/16 2036 12/11/16 1934 12/12/16 1938 12/13/16 1002  AST 48* 37 46* 49*  ALT 37 31 38 40  ALKPHOS 115 109 105 101  BILITOT  0.7 0.6 0.6 0.7  PROT 8.0 7.9 7.8 7.6  ALBUMIN 3.6 3.5 3.5 3.5    Recent Labs Lab 12/06/16 2036  LIPASE 23    Recent Labs Lab 12/11/16 1934 12/12/16 2000 12/13/16 1002  AMMONIA 90* 111* 78*    CBC:  Recent Labs Lab 12/06/16 2036 12/07/16 1839 12/11/16 1934 12/12/16 1938 12/13/16 1002  WBC 6.6 5.8 7.6 7.2 7.8  NEUTROABS  --  3.9 5.3 4.7  --   HGB 9.7* 9.5* 9.4* 9.3*  9.3*  HCT 30.8* 30.2* 30.7* 30.3* 30.4*  MCV 79.7* 79.1* 79.7* 79.6* 80.5  PLT 147* 144* 134* 139* 135*    Cardiac Enzymes:  Recent Labs Lab 12/12/16 1938  TROPONINI <0.03    BNP: Invalid input(s): POCBNP  CBG:  Recent Labs Lab 12/12/16 2209  GLUCAP 96    Microbiology: Results for orders placed or performed during the hospital encounter of 10/04/16  MRSA PCR Screening     Status: None   Collection Time: 10/05/16 11:00 PM  Result Value Ref Range Status   MRSA by PCR NEGATIVE NEGATIVE Final    Comment:        The GeneXpert MRSA Assay (FDA approved for NASAL specimens only), is one component of a comprehensive MRSA colonization surveillance program. It is not intended to diagnose MRSA infection nor to guide or monitor treatment for MRSA infections.     Coagulation Studies:  Recent Labs  12/11/16 1934  LABPROT 14.0  INR 1.08    Urinalysis:  Recent Labs Lab 12/07/16 1839 12/11/16 1946  COLORURINE YELLOW* AMBER*  LABSPEC 1.018 1.025  PHURINE 7.0 5.0  GLUCOSEU NEGATIVE NEGATIVE  HGBUR LARGE* LARGE*  BILIRUBINUR NEGATIVE NEGATIVE  KETONESUR NEGATIVE 5*  PROTEINUR 30* 100*  NITRITE NEGATIVE NEGATIVE  LEUKOCYTESUR NEGATIVE NEGATIVE    Lipid Panel:     Component Value Date/Time   CHOL 208 (H) 05/30/2015 1950   CHOL 234 (H) 12/28/2014 0648   TRIG 126 05/30/2015 1950   TRIG 88 12/28/2014 0648   HDL 19 (L) 05/30/2015 1950   HDL 31 (L) 12/28/2014 0648   CHOLHDL 10.9 05/30/2015 1950   VLDL 25 05/30/2015 1950   VLDL 18 12/28/2014 0648   LDLCALC 164 (H)  05/30/2015 1950   LDLCALC 185 (H) 12/28/2014 0648    HgbA1C:  Lab Results  Component Value Date   HGBA1C 6.8 (H) 07/09/2016    Urine Drug Screen:     Component Value Date/Time   LABOPIA NONE DETECTED 12/11/2016 1946   COCAINSCRNUR NONE DETECTED 12/11/2016 1946   LABBENZ POSITIVE (A) 12/11/2016 1946   AMPHETMU NONE DETECTED 12/11/2016 1946   THCU NONE DETECTED 12/11/2016 1946   LABBARB NONE DETECTED 12/11/2016 1946    Alcohol Level: No results for input(s): ETH in the last 168 hours.  Other results: EKG: normal sinus rhythm at 91 bpm.  Imaging: Dg Chest 2 View  Result Date: 12/13/2016 CLINICAL DATA:  Shortness of breath. EXAM: CHEST  2 VIEW COMPARISON:  December 11, 2016 FINDINGS: There is atelectatic change in the left base. Lungs elsewhere are clear. Heart size and pulmonary vascularity are normal. No adenopathy. No bone lesions. IMPRESSION: Left base atelectasis.  No edema or consolidation. Electronically Signed   By: Bretta Bang III M.D.   On: 12/13/2016 09:50   Ct Head Wo Contrast  Result Date: 12/11/2016 CLINICAL DATA:  Detoxing from alcohol seizures EXAM: CT HEAD WITHOUT CONTRAST TECHNIQUE: Contiguous axial images were obtained from the base of the skull through the vertex without intravenous contrast. COMPARISON:  09/21/2016 FINDINGS: Brain: No evidence of acute infarction, hemorrhage, hydrocephalus, extra-axial collection or mass lesion/mass effect. Vascular: No hyperdense vessel or unexpected calcification. Skull: Normal. Negative for fracture or focal lesion. Sinuses/Orbits: No acute finding. Other: None IMPRESSION: No CT evidence for acute intracranial abnormality. Electronically Signed   By: Jasmine Pang M.D.   On: 12/11/2016 20:08   Dg Chest Port 1 View  Result Date: 12/11/2016 CLINICAL DATA:  Fever and cough for 2-3 days EXAM: PORTABLE CHEST  1 VIEW COMPARISON:  10/05/2016 FINDINGS: There is borderline cardiomegaly. Patchy atelectasis or small infiltrate at the  left base. No pneumothorax. IMPRESSION: 1. Mild cardiomegaly with central vascular congestion 2. Patchy atelectasis or infiltrate at the left lung base. Electronically Signed   By: Jasmine Pang M.D.   On: 12/11/2016 20:26     Assessment/Plan: 36 year old male with a history of seizures, presenting with breakthrough seizures.  On Keppra.  Unclear compliance.  Ammonia elevated which may be contributing as well.  Head CT reviewed and shows no acute changes.    Recommendations: 1.  Would continue Keppra at 1500mg  BID.  If further seizures would add Vimpat at 50mg  BID with plans to increase taper as an outpatient and possibly replace Keppra with Vimpat.   2.  Mother to bring in pills to determine compliance 3.  Seizure precautions 4.  Ativan prn seizures 5.  Agree with addressing ammonia.     Thana Farr, MD Neurology [phone] 12/13/2016, 1:34 PM

## 2016-12-13 NOTE — Care Management Obs Status (Signed)
MEDICARE OBSERVATION STATUS NOTIFICATION   Patient Details  Name: Howard [name] MRN: [id number] Date of Birth: 12/13/1980   Medicare Observation Status Notification Given:  Yes [facility](Moon letter given and discussed)    Artemio Dobie A, RN 12/13/2016, 2:04 PM

## 2016-12-13 NOTE — H&P (Addendum)
Howard [name] is an 36 y.o. male.   Chief Complaint: Seizures HPI: The patient with past medical history of diabetes, seizure disorder as well as diabetes presents emergency department with recurrent seizure activity. The patient had 2 seizures today witnessed by his mother and brother. He had 2 seizures yesterday as well. The patient has not hurt himself during any of these episodes. He has been able to feel a prodrome prior to onset of seizure activity and reports that the top of his head feels funny. He denies synesthesias. He has not lost control of bowel or bladder function during the seizure episodes today. His maintenance dose of Keppra was recently increased as well get seizures persist. Due to to these reasons emergency department staff called the hospitalist service for admission.  Past Medical History:  Diagnosis Date  . Alcoholic cirrhosis of liver with ascites ([place])   . Anemia   . COPD (chronic obstructive pulmonary disease) ([place])   . Depression   . Diabetes ([place])   . Diabetes mellitus, type II ([place])   . Esophageal varices ([place])   . Heart disease   . Hyperlipemia   . Hypertension   . Liver disease   . Multiple thyroid nodules   . Portal hypertensive gastropathy   . Schizophrenia ([place])   . Seizures ([place])     Past Surgical History:  Procedure Laterality Date  . ESOPHAGOGASTRODUODENOSCOPY N/A 10/05/2015   Procedure: ESOPHAGOGASTRODUODENOSCOPY (EGD);  Surgeon: Josefine Class, MD;  Location: [facility] ENDOSCOPY;  Service: Endoscopy;  Laterality: N/A;  . ESOPHAGOGASTRODUODENOSCOPY (EGD) WITH PROPOFOL N/A 08/03/2015   Procedure: ESOPHAGOGASTRODUODENOSCOPY (EGD) WITH PROPOFOL;  Surgeon: Josefine Class, MD;  Location: [facility] ENDOSCOPY;  Service: Endoscopy;  Laterality: N/A;  . ESOPHAGOGASTRODUODENOSCOPY (EGD) WITH PROPOFOL N/A 08/31/2015   Procedure: ESOPHAGOGASTRODUODENOSCOPY (EGD) WITH PROPOFOL;  Surgeon: Josefine Class, MD;  Location: [facility] ENDOSCOPY;  Service: Endoscopy;   Laterality: N/A;  . ESOPHAGOGASTRODUODENOSCOPY (EGD) WITH PROPOFOL N/A 04/04/2016   Procedure: ESOPHAGOGASTRODUODENOSCOPY (EGD) WITH PROPOFOL;  Surgeon: Manya Silvas, MD;  Location: [facility] ENDOSCOPY;  Service: Endoscopy;  Laterality: N/A;  . ESOPHAGOGASTRODUODENOSCOPY (EGD) WITH PROPOFOL N/A 11/13/2016   Procedure: ESOPHAGOGASTRODUODENOSCOPY (EGD) WITH PROPOFOL;  Surgeon: Jonathon Bellows, MD;  Location: [facility] ENDOSCOPY;  Service: Endoscopy;  Laterality: N/A;  . NO PAST SURGERIES      Family History  Problem Relation Age of Onset  . Heart disease Mother   . Hypertension Mother   . Hyperlipidemia Mother   . Stroke Father   . Heart attack Father   . Hypertension Father   . Heart disease Father   . Alcohol abuse Father   . Heart disease Brother    Social History:  reports that he has never smoked. He has never used smokeless tobacco. He reports that he drinks about 30.0 oz of alcohol per week . He reports that he does not use drugs.  Allergies: No Known Allergies  Medications Prior to Admission  Medication Sig Dispense Refill  . acamprosate (CAMPRAL) 333 MG tablet Take 2 tablets (666 mg total) by mouth 3 (three) times daily with meals. 180 tablet 5  . albuterol (PROVENTIL HFA;VENTOLIN HFA) 108 (90 Base) MCG/ACT inhaler Inhale 2 puffs into the lungs every 6 (six) hours as needed for wheezing or shortness of breath. 1 Inhaler 2  . citalopram (CELEXA) 20 MG tablet Take 1 tablet (20 mg total) by mouth every morning. 30 tablet 5  . lactulose (CHRONULAC) 10 GM/15ML solution Take 45 mLs (30 g total) by mouth every  4 (four) hours. 240 mL 0  . levETIRAcetam (KEPPRA) 750 MG tablet Take 2 tablets (1,500 mg total) by mouth 2 (two) times daily. 120 tablet 0  . losartan (COZAAR) 25 MG tablet Take 25 mg by mouth daily.    . metFORMIN (GLUCOPHAGE) 500 MG tablet Take 1 tablet (500 mg total) by mouth 2 (two) times daily with a meal. 60 tablet 0  . Multiple Vitamin (MULTIVITAMIN WITH MINERALS) TABS tablet Take 1  tablet by mouth daily. 30 tablet 0  . nadolol (CORGARD) 20 MG tablet Take 20 mg by mouth daily.    Marland Kitchen OLANZapine (ZYPREXA) 15 MG tablet Take 2 tablets (30 mg total) by mouth at bedtime. 60 tablet 5  . ondansetron (ZOFRAN) 4 MG tablet Take 1 tablet (4 mg total) by mouth every 8 (eight) hours as needed for nausea or vomiting. 10 tablet 0  . rifaximin (XIFAXAN) 550 MG TABS tablet Take 1 tablet (550 mg total) by mouth 2 (two) times daily. 60 tablet 5  . spironolactone (ALDACTONE) 50 MG tablet Take 1 tablet (50 mg total) by mouth 2 (two) times daily. 60 tablet 5  . sucralfate (CARAFATE) 1 g tablet Take 1 g by mouth 4 (four) times daily.    . traZODone (DESYREL) 100 MG tablet Take 100 mg by mouth at bedtime.    . furosemide (LASIX) 20 MG tablet Take 1 tablet (20 mg total) by mouth daily. (Patient not taking: Reported on 12/12/2016) 30 tablet 0  . modafinil (PROVIGIL) 100 MG tablet Take 1 tablet (100 mg total) by mouth daily. (Patient not taking: Reported on 12/12/2016) 30 tablet 0  . pantoprazole (PROTONIX) 40 MG tablet Take 1 tablet (40 mg total) by mouth 2 (two) times daily before a meal. (Patient not taking: Reported on 12/12/2016) 60 tablet 1  . senna-docusate (SENOKOT-S) 8.6-50 MG tablet Take 2 tablets by mouth at bedtime as needed for mild constipation. (Patient not taking: Reported on 12/12/2016) 20 tablet 0    Results for orders placed or performed during the hospital encounter of 12/12/16 (from the past 48 hour(s))  CBC with Differential     Status: Abnormal   Collection Time: 12/12/16  7:38 PM  Result Value Ref Range   WBC 7.2 3.8 - 10.6 K/uL   RBC 3.81 (L) 4.40 - 5.90 MIL/uL   Hemoglobin 9.3 (L) 13.0 - 18.0 g/dL   HCT 30.3 (L) 40.0 - 52.0 %   MCV 79.6 (L) 80.0 - 100.0 fL   MCH 24.4 (L) 26.0 - 34.0 pg   MCHC 30.7 (L) 32.0 - 36.0 g/dL   RDW 21.1 (H) 11.5 - 14.5 %   Platelets 139 (L) 150 - 440 K/uL   Neutrophils Relative % 65 %   Neutro Abs 4.7 1.4 - 6.5 K/uL   Lymphocytes Relative 19 %    Lymphs Abs 1.3 1.0 - 3.6 K/uL   Monocytes Relative 13 %   Monocytes Absolute 0.9 0.2 - 1.0 K/uL   Eosinophils Relative 2 %   Eosinophils Absolute 0.2 0 - 0.7 K/uL   Basophils Relative 1 %   Basophils Absolute 0.1 0 - 0.1 K/uL  Comprehensive metabolic panel     Status: Abnormal   Collection Time: 12/12/16  7:38 PM  Result Value Ref Range   Sodium 137 135 - 145 mmol/L   Potassium 4.5 3.5 - 5.1 mmol/L   Chloride 100 (L) 101 - 111 mmol/L   CO2 32 22 - 32 mmol/L   Glucose, Bld 155 (H)  65 - 99 mg/dL   BUN 21 (H) 6 - 20 mg/dL   Creatinine, Ser 1.35 (H) 0.61 - 1.24 mg/dL   Calcium 8.6 (L) 8.9 - 10.3 mg/dL   Total Protein 7.8 6.5 - 8.1 g/dL   Albumin 3.5 3.5 - 5.0 g/dL   AST 46 (H) 15 - 41 U/L   ALT 38 17 - 63 U/L   Alkaline Phosphatase 105 38 - 126 U/L   Total Bilirubin 0.6 0.3 - 1.2 mg/dL   GFR calc non Af Amer >60 >60 mL/min   GFR calc Af Amer >60 >60 mL/min    Comment: (NOTE) The eGFR has been calculated using the CKD EPI equation. This calculation has not been validated in all clinical situations. eGFR's persistently <60 mL/min signify possible Chronic Kidney Disease.    Anion gap 5 5 - 15  Troponin I     Status: None   Collection Time: 12/12/16  7:38 PM  Result Value Ref Range   Troponin I <0.03 <0.03 ng/mL  TSH     Status: None   Collection Time: 12/12/16  7:38 PM  Result Value Ref Range   TSH 0.948 0.350 - 4.500 uIU/mL    Comment: Performed by a 3rd Generation assay with a functional sensitivity of <=0.01 uIU/mL.  Ammonia     Status: Abnormal   Collection Time: 12/12/16  8:00 PM  Result Value Ref Range   Ammonia 111 (H) 9 - 35 umol/L  Glucose, capillary     Status: None   Collection Time: 12/12/16 10:09 PM  Result Value Ref Range   Glucose-Capillary 96 65 - 99 mg/dL   Ct Head Wo Contrast  Result Date: 12/11/2016 CLINICAL DATA:  Detoxing from alcohol seizures EXAM: CT HEAD WITHOUT CONTRAST TECHNIQUE: Contiguous axial images were obtained from the base of the skull  through the vertex without intravenous contrast. COMPARISON:  09/21/2016 FINDINGS: Brain: No evidence of acute infarction, hemorrhage, hydrocephalus, extra-axial collection or mass lesion/mass effect. Vascular: No hyperdense vessel or unexpected calcification. Skull: Normal. Negative for fracture or focal lesion. Sinuses/Orbits: No acute finding. Other: None IMPRESSION: No CT evidence for acute intracranial abnormality. Electronically Signed   By: Donavan Foil M.D.   On: 12/11/2016 20:08   Dg Chest Port 1 View  Result Date: 12/11/2016 CLINICAL DATA:  Fever and cough for 2-3 days EXAM: PORTABLE CHEST 1 VIEW COMPARISON:  10/05/2016 FINDINGS: There is borderline cardiomegaly. Patchy atelectasis or small infiltrate at the left base. No pneumothorax. IMPRESSION: 1. Mild cardiomegaly with central vascular congestion 2. Patchy atelectasis or infiltrate at the left lung base. Electronically Signed   By: Donavan Foil M.D.   On: 12/11/2016 20:26    Review of Systems  Constitutional: Negative for chills and fever.  HENT: Negative for sore throat and tinnitus.   Eyes: Negative for blurred vision and redness.  Respiratory: Negative for cough and shortness of breath.   Cardiovascular: Negative for chest pain, palpitations, orthopnea and PND.  Gastrointestinal: Negative for abdominal pain, diarrhea, nausea and vomiting.  Genitourinary: Negative for dysuria, frequency and urgency.  Musculoskeletal: Negative for joint pain and myalgias.  Skin: Negative for rash.       No lesions  Neurological: Positive for seizures (??). Negative for speech change, focal weakness and weakness.  Endo/Heme/Allergies: Does not bruise/bleed easily.       No temperature intolerance  Psychiatric/Behavioral: Negative for depression and suicidal ideas.    Blood pressure 122/63, pulse 77, temperature 98.4 F (36.9 C), temperature source Oral,  resp. rate 20, height 6' 3"  (1.905 m), weight (!) 166 kg (366 lb), SpO2 95 %. Physical  Exam  Constitutional: He is oriented to person, place, and time. He appears well-developed and well-nourished. No distress.  HENT:  Head: Normocephalic and atraumatic.  Mouth/Throat: Oropharynx is clear and moist.  Eyes: Conjunctivae and EOM are normal. Pupils are equal, round, and reactive to light. No scleral icterus.  Neck: Normal range of motion. Neck supple. No JVD present. No tracheal deviation present. No thyromegaly present.  Cardiovascular: Normal rate, regular rhythm and normal heart sounds.  Exam reveals no gallop and no friction rub.   No murmur heard. Respiratory: Effort normal and breath sounds normal. No respiratory distress. He has no wheezes.  GI: Soft. Bowel sounds are normal. He exhibits no distension. There is no tenderness.  Genitourinary:  Genitourinary Comments: Deferred  Musculoskeletal: Normal range of motion. He exhibits no edema.  Lymphadenopathy:    He has no cervical adenopathy.  Neurological: He is alert and oriented to person, place, and time. No cranial nerve deficit.  Skin: Skin is warm and dry. No rash noted. No erythema.  Psychiatric: He has a normal mood and affect. His behavior is normal. Judgment and thought content normal.     Assessment/Plan This is a 36 year old male admitted for epilepsy. 1. Epilepsy: Unclear if seizures are organic. I have added oral Ativan to the patient's medication regimen. Also, the patient is on stimulant medication. Consider discontinuation if this may be lowering seizures vessel. We will have neurology consult in the morning. 2. Alcoholic cirrhosis of liver: Elevated ammonia; may be contributing to seizure activity. Continue lactulose as well as nadolol. Lasix and rifaximin for ascites. 3. Essential hypertension: Controlled; continue losartan and spironolactone 4. Diabetes mellitus type 2: Hold oral hypoglycemic agents. Sliding scale insulin while hospitalized. 5. Morbid obesity: BMI 48.5; encourage healthy diet and  exercise 6. Schizophrenia: Maybe contributing to seizure activity. Continue Celexa, trazodone and Zyprexa. 7. DVT prophylaxis: Lovenox 8. GI prophylaxis: Pantoprazole per home regimen The patient is a full code. Time spent on admission orders and patient care approximately 45 minutes  Harrie Foreman, MD 12/13/2016, 3:51 AM

## 2016-12-14 DIAGNOSIS — Z6841 Body Mass Index (BMI) 40.0 and over, adult: Secondary | ICD-10-CM | POA: Diagnosis not present

## 2016-12-14 DIAGNOSIS — Z8249 Family history of ischemic heart disease and other diseases of the circulatory system: Secondary | ICD-10-CM | POA: Diagnosis not present

## 2016-12-14 DIAGNOSIS — G40909 Epilepsy, unspecified, not intractable, without status epilepticus: Secondary | ICD-10-CM | POA: Diagnosis present

## 2016-12-14 DIAGNOSIS — Z9119 Patient's noncompliance with other medical treatment and regimen: Secondary | ICD-10-CM | POA: Diagnosis not present

## 2016-12-14 DIAGNOSIS — E875 Hyperkalemia: Secondary | ICD-10-CM | POA: Diagnosis present

## 2016-12-14 DIAGNOSIS — I10 Essential (primary) hypertension: Secondary | ICD-10-CM | POA: Diagnosis present

## 2016-12-14 DIAGNOSIS — J449 Chronic obstructive pulmonary disease, unspecified: Secondary | ICD-10-CM | POA: Diagnosis present

## 2016-12-14 DIAGNOSIS — J9601 Acute respiratory failure with hypoxia: Secondary | ICD-10-CM | POA: Diagnosis present

## 2016-12-14 DIAGNOSIS — R569 Unspecified convulsions: Secondary | ICD-10-CM | POA: Diagnosis not present

## 2016-12-14 DIAGNOSIS — E119 Type 2 diabetes mellitus without complications: Secondary | ICD-10-CM | POA: Diagnosis present

## 2016-12-14 DIAGNOSIS — F329 Major depressive disorder, single episode, unspecified: Secondary | ICD-10-CM | POA: Diagnosis present

## 2016-12-14 DIAGNOSIS — K7030 Alcoholic cirrhosis of liver without ascites: Secondary | ICD-10-CM | POA: Diagnosis present

## 2016-12-14 DIAGNOSIS — Z9981 Dependence on supplemental oxygen: Secondary | ICD-10-CM | POA: Diagnosis not present

## 2016-12-14 DIAGNOSIS — Z7984 Long term (current) use of oral hypoglycemic drugs: Secondary | ICD-10-CM | POA: Diagnosis not present

## 2016-12-14 DIAGNOSIS — E785 Hyperlipidemia, unspecified: Secondary | ICD-10-CM | POA: Diagnosis present

## 2016-12-14 DIAGNOSIS — K7290 Hepatic failure, unspecified without coma: Secondary | ICD-10-CM | POA: Diagnosis present

## 2016-12-14 DIAGNOSIS — J9690 Respiratory failure, unspecified, unspecified whether with hypoxia or hypercapnia: Secondary | ICD-10-CM | POA: Diagnosis present

## 2016-12-14 DIAGNOSIS — Z811 Family history of alcohol abuse and dependence: Secondary | ICD-10-CM | POA: Diagnosis not present

## 2016-12-14 DIAGNOSIS — F209 Schizophrenia, unspecified: Secondary | ICD-10-CM | POA: Diagnosis present

## 2016-12-14 DIAGNOSIS — Z79899 Other long term (current) drug therapy: Secondary | ICD-10-CM | POA: Diagnosis not present

## 2016-12-14 LAB — COMPREHENSIVE METABOLIC PANEL
ALT: 45 U/L (ref 17–63)
AST: 50 U/L — ABNORMAL HIGH (ref 15–41)
Albumin: 3.6 g/dL (ref 3.5–5.0)
Alkaline Phosphatase: 91 U/L (ref 38–126)
Anion gap: 3 — ABNORMAL LOW (ref 5–15)
BILIRUBIN TOTAL: 0.7 mg/dL (ref 0.3–1.2)
BUN: 17 mg/dL (ref 6–20)
CO2: 35 mmol/L — ABNORMAL HIGH (ref 22–32)
Calcium: 8.6 mg/dL — ABNORMAL LOW (ref 8.9–10.3)
Chloride: 100 mmol/L — ABNORMAL LOW (ref 101–111)
Creatinine, Ser: 0.95 mg/dL (ref 0.61–1.24)
Glucose, Bld: 134 mg/dL — ABNORMAL HIGH (ref 65–99)
POTASSIUM: 4.7 mmol/L (ref 3.5–5.1)
Sodium: 138 mmol/L (ref 135–145)
TOTAL PROTEIN: 7.8 g/dL (ref 6.5–8.1)

## 2016-12-14 LAB — GLUCOSE, CAPILLARY
Glucose-Capillary: 118 mg/dL — ABNORMAL HIGH (ref 65–99)
Glucose-Capillary: 126 mg/dL — ABNORMAL HIGH (ref 65–99)
Glucose-Capillary: 97 mg/dL (ref 65–99)
Glucose-Capillary: 97 mg/dL (ref 65–99)

## 2016-12-14 LAB — HEMOGLOBIN A1C
Hgb A1c MFr Bld: 6.9 % — ABNORMAL HIGH (ref 4.8–5.6)
Mean Plasma Glucose: 151 mg/dL

## 2016-12-14 LAB — AMMONIA: Ammonia: 79 umol/L — ABNORMAL HIGH (ref 9–35)

## 2016-12-14 LAB — LEVETIRACETAM LEVEL: LEVETIRACETAM: 13.6 ug/mL (ref 10.0–40.0)

## 2016-12-14 MED ORDER — KETOROLAC TROMETHAMINE 30 MG/ML IJ SOLN
30.0000 mg | Freq: Four times a day (QID) | INTRAMUSCULAR | Status: DC | PRN
  Administered 2016-12-14: 30 mg via INTRAVENOUS
  Filled 2016-12-14: qty 1

## 2016-12-14 NOTE — Progress Notes (Signed)
[facility] - [place] at [facility]   PATIENT NAME: Howard [name]    MR#:  [id number]  DATE OF BIRTH:  09-Nov-1980  SUBJECTIVE:  CHIEF COMPLAINT:  Pt was hypoxic needing 3-4 lit of o2 today, pt lives on 2 lit chronically Admits h/o cpap, noncompliant with cpap machine and has ch copd; last alcohol intake was 9 days ago Denies any shortness of breath or chest tightness,Patient has chronic daytime sleepiness which is his normal Arousable and answers all questions. No other episodes of seizures noticed  REVIEW OF SYSTEMS:  CONSTITUTIONAL: No fever, fatigue or weakness.  EYES: No blurred or double vision.  EARS, NOSE, AND THROAT: No tinnitus or ear pain.  RESPIRATORY: No cough, shortness of breath, wheezing or hemoptysis.  CARDIOVASCULAR: No chest pain, orthopnea, edema.  GASTROINTESTINAL: No nausea, vomiting, diarrhea or abdominal pain.  GENITOURINARY: No dysuria, hematuria.  ENDOCRINE: No polyuria, nocturia,  HEMATOLOGY: No anemia, easy bruising or bleeding SKIN: No rash or lesion. MUSCULOSKELETAL: No joint pain or arthritis.   NEUROLOGIC: No tingling, numbness, weakness.  PSYCHIATRY: No anxiety or depression.   DRUG ALLERGIES:  No Known Allergies  VITALS:  Blood pressure (!) 128/59, pulse 84, temperature 98.6 F (37 C), resp. rate 18, height 6\' 3"  (1.905 m), weight (!) 164.9 kg (363 lb 9.6 oz), SpO2 (!) 88 %.  PHYSICAL EXAMINATION:  GENERAL:  36 y.o.-year-old patient lying in the bed with no acute distress. Morbidly obese EYES: Pupils equal, round, reactive to light and accommodation. No scleral icterus. Extraocular muscles intact.  HEENT: Head atraumatic, normocephalic. Oropharynx and nasopharynx clear.  NECK:  Supple, no jugular venous distention. No thyroid enlargement, no tenderness.  LUNGS: Moderately diminished breath sounds bilaterally, no wheezing, rales,rhonchi or crepitation. No use of accessory muscles of respiration.  CARDIOVASCULAR: S1, S2  normal. No murmurs, rubs, or gallops.  ABDOMEN: Soft, nontender, mod distended with ascites . Bowel sounds present. No organomegaly or mass.  EXTREMITIES: No pedal edema, cyanosis, or clubbing.  NEUROLOGIC: Cranial nerves II through XII are intact. Muscle strength 5/5 in all extremities. Sensation intact. Gait not checked.  PSYCHIATRIC: The patient is alert and oriented x 3.  SKIN: No obvious rash, lesion, or ulcer.    LABORATORY PANEL:   CBC  Recent Labs Lab 12/13/16 1002  WBC 7.8  HGB 9.3*  HCT 30.4*  PLT 135*   ------------------------------------------------------------------------------------------------------------------  Chemistries   Recent Labs Lab 12/13/16 1002 12/13/16 1511  NA 138  --   K 5.4* 5.0  CL 102  --   CO2 31  --   GLUCOSE 144*  --   BUN 22*  --   CREATININE 1.20  --   CALCIUM 8.5*  --   AST 49*  --   ALT 40  --   ALKPHOS 101  --   BILITOT 0.7  --    ------------------------------------------------------------------------------------------------------------------  Cardiac Enzymes  Recent Labs Lab 12/12/16 1938  TROPONINI <0.03   ------------------------------------------------------------------------------------------------------------------  RADIOLOGY:  Dg Chest 2 View  Result Date: 12/13/2016 CLINICAL DATA:  Shortness of breath. EXAM: CHEST  2 VIEW COMPARISON:  December 11, 2016 FINDINGS: There is atelectatic change in the left base. Lungs elsewhere are clear. Heart size and pulmonary vascularity are normal. No adenopathy. No bone lesions. IMPRESSION: Left base atelectasis.  No edema or consolidation. Electronically Signed   By: Bretta Bang III M.D.   On: 12/13/2016 09:50    EKG:   Orders placed or performed during the hospital encounter of 12/12/16  .  ED EKG  . ED EKG    ASSESSMENT AND PLAN:    This is a 36 year old male admitted for epilepsy.  #Acute hypoxic respiratory failure could be from sleep apnea related apneic  Spells/narcolepsy/morbid obesity/pickwickian syndrome Chest x-ray negative Encouraged to use CPAPOutpatient follow-up with pulmonology will be helpful Oxygen via nasal cannula and wean off as tolerated to 2 L of chronic home oxygen Albuterol as needed given the history of COPD with no exacerbations Incentive spirometry  # Breakthrough seizures with chronic history of seizures Unclear if seizures are organic.  No other episodes of seizures since patient is admitted to the hospital  oral Ativan to the patient's medication regimen.  Continue Keppra 1500 mg by mouth twice a day.If further seizures would add Vimpat at 50mg  BID with plans to increase taper as an outpatient and possibly replace Keppra with Vimpat, outpatient follow-up with neurology as recommended Appreciate neurology recommendations will follow up with neurology as needed -Discussed with mother bringing his home medications and  #. Alcoholic cirrhosis of liver: Ammonia level is better Continue lactulose as well as nadolol. Lasix and rifaximin for ascites. -Patient is not encephalopathic, ammonia level 111--78--79 which could be his baseline -ciwa  #Hyperkalemia will provide Kayexalate  #. Essential hypertension: Controlled; continue losartan and spironolactone  #  Diabetes mellitus type 2: Hold oral hypoglycemic agents. Sliding scale insulin while hospitalized.  #. Morbid obesity: BMI 48.5; encourage healthy diet and exercise  #. Schizophrenia:  Continue Celexa, trazodone and Zyprexa. #. DVT prophylaxis: Lovenox #. GI prophylaxis: Pantoprazole per home regimen  PT consult Encourage out of bed and ambulate in the hallway   All the records are reviewed and case discussed with Care Management/Social Workerr. Management plans discussed with the patient, family and they are in agreement.  CODE STATUS: fc   TOTAL TIME TAKING CARE OF THIS PATIENT: 35  minutes.   POSSIBLE D/C IN 2  DAYS, DEPENDING ON CLINICAL  CONDITION.  Note: This dictation was prepared with Dragon dictation along with smaller phrase technology. Any transcriptional errors that result from this process are unintentional.   Ramonita LabGouru, Naavya Postma M.D on 12/14/2016 at 2:06 PM  Between 7am to 6pm - Pager - [phone] After 6pm go to [email or web address] - password [email or web address] [email or web address]  [facility]  Office  [phone]  CC: Primary care physician; Marisue IvanLINTHAVONG, [name], MD

## 2016-12-14 NOTE — Progress Notes (Signed)
Pt had a nose bleed for the second time within 4 hours. He states he has episodes of nose bleeds at least once per day at home. Pt continues to be lethargic, waking up periodically long enough to eat/take medication. Held 8:00 ativan. MD will be notified. Otilio JeffersonMadelyn S Fenton, RN

## 2016-12-14 NOTE — Progress Notes (Signed)
Pt very lethargic. Oxygen sat was 88 on 6 CPAP. Respiratory advised to increase to 10- oxygen sat came up to 94. MD, Dr. Sheryle Hailiamond made aware. Pt will wake up and say a few words but go back to sleep. MD advised to not give 0400 ativan or lactulose. Will continue to monitor. Louis MeckelMcRae, Aleicia Kenagy H

## 2016-12-15 LAB — CBC WITH DIFFERENTIAL/PLATELET
Basophils Absolute: 0.1 10*3/uL (ref 0–0.1)
Basophils Relative: 1 %
EOS PCT: 3 %
Eosinophils Absolute: 0.2 10*3/uL (ref 0–0.7)
HEMATOCRIT: 29.5 % — AB (ref 40.0–52.0)
HEMOGLOBIN: 9.1 g/dL — AB (ref 13.0–18.0)
LYMPHS ABS: 1.3 10*3/uL (ref 1.0–3.6)
LYMPHS PCT: 21 %
MCH: 25 pg — AB (ref 26.0–34.0)
MCHC: 31 g/dL — ABNORMAL LOW (ref 32.0–36.0)
MCV: 80.9 fL (ref 80.0–100.0)
MONO ABS: 0.7 10*3/uL (ref 0.2–1.0)
MONOS PCT: 12 %
Neutro Abs: 4.1 10*3/uL (ref 1.4–6.5)
Neutrophils Relative %: 63 %
Platelets: 123 10*3/uL — ABNORMAL LOW (ref 150–440)
RBC: 3.65 MIL/uL — AB (ref 4.40–5.90)
RDW: 20.9 % — ABNORMAL HIGH (ref 11.5–14.5)
WBC: 6.4 10*3/uL (ref 3.8–10.6)

## 2016-12-15 LAB — GLUCOSE, CAPILLARY
Glucose-Capillary: 83 mg/dL (ref 65–99)
Glucose-Capillary: 118 mg/dL — ABNORMAL HIGH (ref 65–99)

## 2016-12-15 LAB — AMMONIA: Ammonia: 27 umol/L (ref 9–35)

## 2016-12-15 MED ORDER — DOCUSATE SODIUM 100 MG PO CAPS: 100.0000 mg | ORAL_CAPSULE | Freq: Two times a day (BID) | ORAL | 0 refills | Status: DC | PRN

## 2016-12-15 NOTE — Discharge Summary (Signed)
[facility] - [place] at [facility]   PATIENT NAME: Howard [name]    MR#:  [id number]  DATE OF BIRTH:  [birth year]  DATE OF ADMISSION:  12/12/2016 ADMITTING PHYSICIAN: Arnaldo Natal, MD  DATE OF DISCHARGE: 12/15/16 PRIMARY CARE PHYSICIAN: Marisue Ivan, MD    ADMISSION DIAGNOSIS:  Seizures (HCC) [R56.9]  DISCHARGE DIAGNOSIS:   Breakthrough seizures Hepatic encephalopathy  SECONDARY DIAGNOSIS:   Past Medical History:  Diagnosis Date  . Alcoholic cirrhosis of liver with ascites (HCC)   . Anemia   . COPD (chronic obstructive pulmonary disease) (HCC)   . Depression   . Diabetes (HCC)   . Diabetes mellitus, type II (HCC)   . Esophageal varices (HCC)   . Heart disease   . Hyperlipemia   . Hypertension   . Liver disease   . Multiple thyroid nodules   . Portal hypertensive gastropathy   . Schizophrenia (HCC)   . Seizures [facility])     HOSPITAL COURSE:  HPI: The patient with past medical history of diabetes, seizure disorder as well as diabetes presents emergency department with recurrent seizure activity. The patient had 2 seizures today witnessed by his mother and brother. He had 2 seizures yesterday as well. The patient has not hurt himself during any of these episodes. He has been able to feel a prodrome prior to onset of seizure activity and reports that the top of his head feels funny. He denies synesthesias. He has not lost control of bowel or bladder function during the seizure episodes today. His maintenance dose of Keppra was recently increased as well get seizures persist. Due to to these reasons emergency department staff called the hospitalist service for admission.  #Acute hypoxic respiratory failure could be from sleep apnea related apneic Spells/narcolepsy/morbid obesity/pickwickian syndrome Chest x-ray negative Encouraged to use CPAPOutpatient follow-up with pulmonology will be helpful Oxygen via nasal cannula and wean off as tolerated  to 2 -3L of chronic home oxygen Albuterol as needed given the history of COPD with no exacerbations Incentive spirometry  # Breakthrough seizures with chronic history of seizures Unclear if seizures are organic.  No other episodes of seizures since patient is admitted to the hospital  oral Ativan to the patient's medication regimen.  Continue Keppra 1500 mg by mouth twice a day.If further seizures would add Vimpat at 50mg  BID as recommended by neurology but no other episodes of breakthrough seizures were noticed. Will continue Keppra 1500 mg by mouth twice a day.  outpatient follow-up with neurology as recommended Hospital pharmacist has verified patient's home medications which were brought in by the mom, patient is compliant with his medications Appreciate neurology recommendations will follow up with neurology outpatient  #. Alcoholic cirrhosis of liver: Ammonia level is better Continue lactulose as well as nadolol. Lasix and rifaximin for ascites. -Patient is not encephalopathic, ammonia level 111--78--79 --27 -ciwa  #Hyperkalemia Kayexalate given potassium down to 4.7  #. Essential hypertension: Controlled; continue losartan and spironolactone  # Diabetes mellitus type 2 oral hypoglycemic agents. Sliding scale insulin while hospitalized.  #.Morbid obesity: BMI 48.5; encourage healthy diet and exercise  #. Schizophrenia:  Continue Celexa, trazodone and Zyprexa. #. DVT prophylaxis: Lovenox #. GI prophylaxis: Pantoprazole per home regimen  PT consult-recommended skilled nursing facility but patient prefers going home will arrange home health PT and RN Continue home oxygen 2-3 L oxygen cannula   DISCHARGE CONDITIONS:   fair  CONSULTS OBTAINED:  Treatment Team:  Thana Farr, MD   PROCEDURES  None  DRUG ALLERGIES:  No Known Allergies  DISCHARGE MEDICATIONS:   Current Discharge Medication List    CONTINUE these medications which have NOT CHANGED    Details  acamprosate (CAMPRAL) 333 MG tablet Take 2 tablets (666 mg total) by mouth 3 (three) times daily with meals. Qty: 180 tablet, Refills: 5    albuterol (PROVENTIL HFA;VENTOLIN HFA) 108 (90 Base) MCG/ACT inhaler Inhale 2 puffs into the lungs every 6 (six) hours as needed for wheezing or shortness of breath. Qty: 1 Inhaler, Refills: 2    citalopram (CELEXA) 20 MG tablet Take 1 tablet (20 mg total) by mouth every morning. Qty: 30 tablet, Refills: 5    lactulose (CHRONULAC) 10 GM/15ML solution Take 45 mLs (30 g total) by mouth every 4 (four) hours. Qty: 240 mL, Refills: 0    levETIRAcetam (KEPPRA) 750 MG tablet Take 2 tablets (1,500 mg total) by mouth 2 (two) times daily. Qty: 120 tablet, Refills: 0    losartan (COZAAR) 25 MG tablet Take 25 mg by mouth daily.    metFORMIN (GLUCOPHAGE) 500 MG tablet Take 1 tablet (500 mg total) by mouth 2 (two) times daily with a meal. Qty: 60 tablet, Refills: 0    Multiple Vitamin (MULTIVITAMIN WITH MINERALS) TABS tablet Take 1 tablet by mouth daily. Qty: 30 tablet, Refills: 0    nadolol (CORGARD) 20 MG tablet Take 20 mg by mouth daily.    OLANZapine (ZYPREXA) 15 MG tablet Take 2 tablets (30 mg total) by mouth at bedtime. Qty: 60 tablet, Refills: 5    ondansetron (ZOFRAN) 4 MG tablet Take 1 tablet (4 mg total) by mouth every 8 (eight) hours as needed for nausea or vomiting. Qty: 10 tablet, Refills: 0    rifaximin (XIFAXAN) 550 MG TABS tablet Take 1 tablet (550 mg total) by mouth 2 (two) times daily. Qty: 60 tablet, Refills: 5    spironolactone (ALDACTONE) 50 MG tablet Take 1 tablet (50 mg total) by mouth 2 (two) times daily. Qty: 60 tablet, Refills: 5    sucralfate (CARAFATE) 1 g tablet Take 1 g by mouth 4 (four) times daily.    traZODone (DESYREL) 100 MG tablet Take 100 mg by mouth at bedtime.    furosemide (LASIX) 20 MG tablet Take 1 tablet (20 mg total) by mouth daily. Qty: 30 tablet, Refills: 0    modafinil (PROVIGIL) 100 MG tablet  Take 1 tablet (100 mg total) by mouth daily. Qty: 30 tablet, Refills: 0    pantoprazole (PROTONIX) 40 MG tablet Take 1 tablet (40 mg total) by mouth 2 (two) times daily before a meal. Qty: 60 tablet, Refills: 1    senna-docusate (SENOKOT-S) 8.6-50 MG tablet Take 2 tablets by mouth at bedtime as needed for mild constipation. Qty: 20 tablet, Refills: 0         DISCHARGE INSTRUCTIONS:   Follow-up with primary care physician in 5 days   follow-up with neurology Dr. Malvin Johns in a week Follow-up with gastroenterology Dr. Mechele Collin in 2 weeks Follow-up with pulmonology in 2 weeks patient needs to be on CPAP Continue oxygen via nasal cannula Continue home health    DIET:  Cardiac diet, diabetic  DISCHARGE CONDITION:  Stable  ACTIVITY:  Activity as tolerated  OXYGEN:  Home Oxygen: Yes.     Oxygen Delivery: 2-3 liters/min via Patient connected to nasal cannula oxygen  DISCHARGE LOCATION:  home   If you experience worsening of your admission symptoms, develop shortness of breath, life threatening emergency, suicidal or homicidal thoughts you must  seek medical attention immediately by calling 911 or calling your MD immediately  if symptoms less severe.  You Must read complete instructions/literature along with all the possible adverse reactions/side effects for all the Medicines you take and that have been prescribed to you. Take any new Medicines after you have completely understood and accpet all the possible adverse reactions/side effects.   Please note  You were cared for by a hospitalist during your hospital stay. If you have any questions about your discharge medications or the care you received while you were in the hospital after you are discharged, you can call the unit and asked to speak with the hospitalist on call if the hospitalist that took care of you is not available. Once you are discharged, your primary care physician will handle any further medical issues. Please  note that NO REFILLS for any discharge medications will be authorized once you are discharged, as it is imperative that you return to your primary care physician (or establish a relationship with a primary care physician if you do not have one) for your aftercare needs so that they can reassess your need for medications and monitor your lab values.     Today  Chief Complaint  Patient presents with  . Seizures   Patient is doing fine. Physical therapy has recommended skilled nursing facility patient refused and wants to go home. Doing much better nor worse of seizures were noticed. Noncompliant with the CPAP machine. Patient has excessive daytime sleepiness which is his normal and chronic  ROS:  CONSTITUTIONAL: Denies fevers, chills. Denies any fatigue, weakness.  EYES: Denies blurry vision, double vision, eye pain. EARS, NOSE, THROAT: Denies tinnitus, ear pain, hearing loss. RESPIRATORY: Denies cough, wheeze, shortness of breath.  CARDIOVASCULAR: Denies chest pain, palpitations, edema.  GASTROINTESTINAL: Denies nausea, vomiting, diarrhea, abdominal pain. Denies bright red blood per rectum. GENITOURINARY: Denies dysuria, hematuria. ENDOCRINE: Denies nocturia or thyroid problems. HEMATOLOGIC AND LYMPHATIC: Denies easy bruising or bleeding. SKIN: Denies rash or lesion. MUSCULOSKELETAL: Denies pain in neck, back, shoulder, knees, hips or arthritic symptoms.  NEUROLOGIC: Denies paralysis, paresthesias.  PSYCHIATRIC: Denies anxiety or depressive symptoms.   VITAL SIGNS:  Blood pressure 132/74, pulse 81, temperature 98.1 F (36.7 C), temperature source Oral, resp. rate 18, height 6\' 3"  (1.905 m), weight (!) 164.9 kg (363 lb 9.6 oz), SpO2 95 %.  I/O:    Intake/Output Summary (Last 24 hours) at 12/15/16 1601 Last data filed at 12/15/16 0945  Gross per 24 hour  Intake              360 ml  Output             1600 ml  Net            -1240 ml    PHYSICAL EXAMINATION:  GENERAL:  36  y.o.-year-old patient lying in the bed with no acute distress. Morbidly obese EYES: Pupils equal, round, reactive to light and accommodation. No scleral icterus. Extraocular muscles intact.  HEENT: Head atraumatic, normocephalic. Oropharynx and nasopharynx clear.  NECK:  Supple, no jugular venous distention. No thyroid enlargement, no tenderness.  LUNGS: Normal breath sounds bilaterally, no wheezing, rales,rhonchi or crepitation. No use of accessory muscles of respiration.  CARDIOVASCULAR: S1, S2 normal. No murmurs, rubs, or gallops.  ABDOMEN: Soft, non-tender, non-distended. Bowel sounds present. No organomegaly or mass.  EXTREMITIES: No pedal edema, cyanosis, or clubbing.  NEUROLOGIC: Cranial nerves II through XII are intact. Muscle strength 5/5 in all extremities. Sensation intact. Gait  not checked.  PSYCHIATRIC: The patient is alert and oriented x 3.  SKIN: No obvious rash, lesion, or ulcer.   DATA REVIEW:   CBC  Recent Labs Lab 12/15/16 0512  WBC 6.4  HGB 9.1*  HCT 29.5*  PLT 123*    Chemistries   Recent Labs Lab 12/14/16 1157  NA 138  K 4.7  CL 100*  CO2 35*  GLUCOSE 134*  BUN 17  CREATININE 0.95  CALCIUM 8.6*  AST 50*  ALT 45  ALKPHOS 91  BILITOT 0.7    Cardiac Enzymes  Recent Labs Lab 12/12/16 1938  TROPONINI <0.03    Microbiology Results  Results for orders placed or performed during the hospital encounter of 10/04/16  MRSA PCR Screening     Status: None   Collection Time: 10/05/16 11:00 PM  Result Value Ref Range Status   MRSA by PCR NEGATIVE NEGATIVE Final    Comment:        The GeneXpert MRSA Assay (FDA approved for NASAL specimens only), is one component of a comprehensive MRSA colonization surveillance program. It is not intended to diagnose MRSA infection nor to guide or monitor treatment for MRSA infections.     RADIOLOGY:  Dg Chest 2 View  Result Date: 12/13/2016 CLINICAL DATA:  Shortness of breath. EXAM: CHEST  2 VIEW  COMPARISON:  December 11, 2016 FINDINGS: There is atelectatic change in the left base. Lungs elsewhere are clear. Heart size and pulmonary vascularity are normal. No adenopathy. No bone lesions. IMPRESSION: Left base atelectasis.  No edema or consolidation. Electronically Signed   By: Bretta Bang III M.D.   On: 12/13/2016 09:50   Ct Head Wo Contrast  Result Date: 12/11/2016 CLINICAL DATA:  Detoxing from alcohol seizures EXAM: CT HEAD WITHOUT CONTRAST TECHNIQUE: Contiguous axial images were obtained from the base of the skull through the vertex without intravenous contrast. COMPARISON:  09/21/2016 FINDINGS: Brain: No evidence of acute infarction, hemorrhage, hydrocephalus, extra-axial collection or mass lesion/mass effect. Vascular: No hyperdense vessel or unexpected calcification. Skull: Normal. Negative for fracture or focal lesion. Sinuses/Orbits: No acute finding. Other: None IMPRESSION: No CT evidence for acute intracranial abnormality. Electronically Signed   By: Jasmine Pang M.D.   On: 12/11/2016 20:08   Dg Chest Port 1 View  Result Date: 12/11/2016 CLINICAL DATA:  Fever and cough for 2-3 days EXAM: PORTABLE CHEST 1 VIEW COMPARISON:  10/05/2016 FINDINGS: There is borderline cardiomegaly. Patchy atelectasis or small infiltrate at the left base. No pneumothorax. IMPRESSION: 1. Mild cardiomegaly with central vascular congestion 2. Patchy atelectasis or infiltrate at the left lung base. Electronically Signed   By: Jasmine Pang M.D.   On: 12/11/2016 20:26    EKG:   Orders placed or performed during the hospital encounter of 12/12/16  . ED EKG  . ED EKG      Management plans discussed with the patient,Patient's mom and brother at bedside and they are in agreement.  CODE STATUS:     Code Status Orders        Start     Ordered   12/12/16 2341  Full code  Continuous     12/12/16 2340    Code Status History    Date Active Date Inactive Code Status Order ID Comments User Context    11/24/2016  1:11 AM 11/25/2016  4:42 PM Full Code [id number]  Tonye Royalty, DO Inpatient   11/12/2016  3:36 PM 11/14/2016  2:36 PM Full Code [id number]  Auburn Bilberry, MD Inpatient  10/04/2016  6:04 PM 10/08/2016  3:44 PM Full Code [id number]  Shaune PollackQing Chen, MD Inpatient   09/22/2016  2:29 AM 09/23/2016  6:38 PM Full Code [id number]  Oralia Manisavid Willis, MD Inpatient   08/07/2016  7:37 PM 08/12/2016  8:51 PM Full Code [id number]  Gracelyn NurseJohn D Johnston, MD Inpatient   07/07/2016  1:40 AM 07/09/2016  6:29 PM Full Code [id number]  Ihor AustinPavan Pyreddy, MD Inpatient   06/10/2016  6:21 AM 06/11/2016  8:25 PM Full Code [id number]  Ihor AustinPavan Pyreddy, MD Inpatient   05/12/2016  3:55 AM 05/15/2016  2:57 PM Full Code [id number]  Ihor AustinPavan Pyreddy, MD Inpatient   04/28/2016  2:54 AM 04/29/2016  6:23 PM Full Code [id number]  Tonye RoyaltyAlexis Hugelmeyer, DO Inpatient   03/14/2016 12:30 PM 03/16/2016  3:41 PM Full Code [id number]  Shaune PollackQing Chen, MD Inpatient   03/03/2016 12:13 PM 03/06/2016  2:26 PM Full Code [id number]  Milagros LollSrikar Sudini, MD ED   02/10/2016  5:54 PM 02/12/2016  7:34 PM Full Code [id number]  Ramonita LabAruna Loucinda Croy, MD Inpatient   11/11/2015  5:16 AM 11/13/2015  6:00 PM Full Code [id number]  Ihor AustinPavan Pyreddy, MD Inpatient   11/07/2015 10:02 PM 11/09/2015  4:56 PM Full Code [id number]  Enid Baasadhika Kalisetti, MD Inpatient   10/28/2015  8:14 PM 10/29/2015  2:26 PM Full Code [id number]  Altamese DillingVaibhavkumar Vachhani, MD Inpatient   10/16/2015 11:47 PM 10/17/2015  6:00 PM Full Code [id number]  Oralia Manisavid Willis, MD Inpatient   07/30/2015  5:35 PM 08/04/2015  8:34 PM Full Code [id number]  Enedina FinnerSona Patel, MD Inpatient   07/05/2015  1:16 AM 07/08/2015  7:22 PM Full Code [id number]  Oralia Manisavid Willis, MD Inpatient   05/30/2015  7:28 PM 06/01/2015  3:36 PM Full Code [id number]  Audery AmelJohn T [name], MD Inpatient      TOTAL TIME TAKING CARE OF THIS PATIENT: 45 minutes.   Note: This dictation was prepared with Dragon dictation along with smaller phrase technology. Any transcriptional errors that result from this process are  unintentional.   @[facility] @  on 12/15/2016 at 4:01 PM  Between 7am to 6pm - Pager - [phone]  After 6pm go to [email or web address] - password [email or web address] [facility]  [facility]  Office  [phone]  CC: Primary care physician; Marisue IvanLINTHAVONG, [name], MD

## 2016-12-15 NOTE — Care Management (Signed)
Declining skilled nursing facility at this time. Would like to go home with resumption of services per [facility]. Hilbert CorriganKaren Lawson, [facility] representative updated.  Mother will transport Howard GreetBrenda S Cassanda Walmer RN MSN CCM Care Management

## 2016-12-15 NOTE — Progress Notes (Signed)
Discharge  Pt and family were able to participate with discharge teaching. PIV was removed and pt was allowed to dress. Pt and family were informed of follow up appointments. Pt was provided a wheelchair.

## 2016-12-15 NOTE — Care Management (Signed)
Admitted to this facility with the diagnosis of seizures. Lives with mother, Kathie RhodesBetty, [phone]) and brother. Last seen Dr. Burnadette PopLinthavong 12/04/16. [facility]. [facility] for nursing in the past. No skilled facility. Home oxygen per [facility] from October 2016-January 2017. [facility]  Rolling walker, cane, and wheelchair in the home. [facility] came and got his CPAP a week ago. [facility].  States that a company   can monitor usuage from [employer]. Probably picked CPAP up due to non-compliance.  Takes care of all basic activities of daily living himself, doesn't drive. Mother does errands. [facility]. Gwenette GreetBrenda S Takeisha Cianci RN MSN CCM Care Management

## 2016-12-15 NOTE — Progress Notes (Signed)
Subjective: No further seizures noted.  Patient remains lethargic. Receiving frequent doses of Ativan.    Objective: Current vital signs: BP 131/61 (BP Location: Left Arm)   Pulse 74   Temp 98.3 F (36.8 C)   Resp 14   Ht 6\' 3"  (1.905 m)   Wt (!) 164.9 kg (363 lb 9.6 oz)   SpO2 91%   BMI 45.45 kg/m  Vital signs in last 24 hours: Temp:  [98.3 F (36.8 C)-98.8 F (37.1 C)] 98.3 F (36.8 C) (03/26 0448) Pulse Rate:  [74-93] 74 (03/26 0448) Resp:  [14-20] 14 (03/26 0456) BP: (111-131)/(47-61) 131/61 (03/26 0448) SpO2:  [84 %-91 %] 91 % (03/26 0456)  Intake/Output from previous day: 03/25 0701 - 03/26 0700 In: 600 [P.O.:600] Out: 1650 [Urine:1650] Intake/Output this shift: Total I/O In: 120 [P.O.:120] Out: 300 [Urine:300] Nutritional status: Diet Heart Room service appropriate? Yes; Fluid consistency: Thin  Neurologic Exam: Mental Status: Lethargic but oriented.  Speech fluent without evidence of aphasia.  Able to follow 3 step commands without difficulty. Cranial Nerves: II: Discs flat bilaterally; Visual fields grossly normal, pupils equal, round, reactive to light and accommodation III,IV, VI: ptosis not present, extra-ocular motions intact bilaterally V,VII: smile symmetric, facial light touch sensation normal bilaterally VIII: hearing normal bilaterally IX,X: gag reflex present XI: bilateral shoulder shrug XII: midline tongue extension Motor: Right :  Upper extremity   5/5                                      Left:     Upper extremity   5/5             Lower extremity   5/5                                                  Lower extremity   5/5 Tone and bulk:normal tone throughout; no atrophy noted Sensory: Pinprick and light touch intact throughout, bilaterally   Lab Results: Basic Metabolic Panel:  Recent Labs Lab 12/11/16 1934 12/12/16 1938 12/13/16 1002 12/13/16 1511 12/14/16 1157  NA 137 137 138  --  138  K 4.6 4.5 5.4* 5.0 4.7  CL 101 100* 102  --   100*  CO2 31 32 31  --  35*  GLUCOSE 256* 155* 144*  --  134*  BUN 19 21* 22*  --  17  CREATININE 0.86 1.35* 1.20  --  0.95  CALCIUM 8.6* 8.6* 8.5*  --  8.6*    Liver Function Tests:  Recent Labs Lab 12/11/16 1934 12/12/16 1938 12/13/16 1002 12/14/16 1157  AST 37 46* 49* 50*  ALT 31 38 40 45  ALKPHOS 109 105 101 91  BILITOT 0.6 0.6 0.7 0.7  PROT 7.9 7.8 7.6 7.8  ALBUMIN 3.5 3.5 3.5 3.6   No results for input(s): LIPASE, AMYLASE in the last 168 hours.  Recent Labs Lab 12/13/16 1002 12/14/16 1153 12/15/16 0512  AMMONIA 78* 79* 27    CBC:  Recent Labs Lab 12/11/16 1934 12/12/16 1938 12/13/16 1002 12/15/16 0512  WBC 7.6 7.2 7.8 6.4  NEUTROABS 5.3 4.7  --  4.1  HGB 9.4* 9.3* 9.3* 9.1*  HCT 30.7* 30.3* 30.4* 29.5*  MCV 79.7* 79.6* 80.5 80.9  PLT 134* 139*  135* 123*    Cardiac Enzymes:  Recent Labs Lab 12/12/16 1938  TROPONINI <0.03    Lipid Panel: No results for input(s): CHOL, TRIG, HDL, CHOLHDL, VLDL, LDLCALC in the last 168 hours.  CBG:  Recent Labs Lab 12/14/16 0759 12/14/16 1202 12/14/16 1657 12/14/16 2027 12/15/16 0742  GLUCAP 118* 126* 97 97 83    Microbiology: Results for orders placed or performed during the hospital encounter of 10/04/16  MRSA PCR Screening     Status: None   Collection Time: 10/05/16 11:00 PM  Result Value Ref Range Status   MRSA by PCR NEGATIVE NEGATIVE Final    Comment:        The GeneXpert MRSA Assay (FDA approved for NASAL specimens only), is one component of a comprehensive MRSA colonization surveillance program. It is not intended to diagnose MRSA infection nor to guide or monitor treatment for MRSA infections.     Coagulation Studies: No results for input(s): LABPROT, INR in the last 72 hours.  Imaging: No results found.  Medications:  I have reviewed the patient's current medications. Scheduled: . acamprosate  666 mg Oral TID WC  . citalopram  20 mg Oral [id number]  . docusate sodium  100  mg Oral BID  . enoxaparin (LOVENOX) injection  40 mg Subcutaneous BID  . furosemide  20 mg Oral Daily  . insulin aspart  0-5 Units Subcutaneous QHS  . insulin aspart  0-9 Units Subcutaneous TID WC  . lactulose  30 g Oral Q4H  . levETIRAcetam  1,500 mg Oral BID  . LORazepam  0.5 mg Oral Q4H  . losartan  25 mg Oral Daily  . modafinil  100 mg Oral Daily  . multivitamin with minerals  1 tablet Oral Daily  . nadolol  20 mg Oral Daily  . OLANZapine  30 mg Oral QHS  . pantoprazole  40 mg Oral BID AC  . rifaximin  550 mg Oral BID  . sodium chloride flush  3 mL Intravenous Q12H  . spironolactone  50 mg Oral BID  . sucralfate  1 g Oral QID  . traZODone  100 mg Oral QHS    Assessment/Plan: Patient without further seizures noted.  Ammonia markedly improved (27 today), .  Patient being dosed frequently with Ativan.  Remains on Keppra.    Recommendations: 1.  May want to consider taper of Ativan.     LOS: 1 day   Thana Farr, MD Neurology [phone] 12/15/2016  11:10 AM

## 2016-12-15 NOTE — Evaluation (Signed)
Physical Therapy Evaluation Patient Details Name: Howard [name] MRN: [id number] DOB: 1981/06/06 Today's Date: 12/15/2016   History of Present Illness  Pt admitted for complaints of seizures. Per chart review, 2 seizures recently and 2 on day of admission. History including alcoholic cirrhosis of liver, anemia, COPD, depression, DM, HTN, and schizophrenia.   Clinical Impression  Pt is a pleasant 36 year old male who was admitted for multiple seizures. Pt performs bed mobility with mod I and transfers/ambulation with min assist and BRW. +2 assist required secondary to safety. Pt extremely weak this admission with tremors noted in B UE. Has difficulty with mobility and very unsteady. Pt demonstrates deficits with balance/mobility/strength. Pt is very high falls risk at this time. All mobility performed with 2L of O2 donned, however quickly desats with limited exertion. Pt is currently not at baseline level. Would benefit from skilled PT to address above deficits and promote optimal return to PLOF; recommend transition to STR upon discharge from acute hospitalization.       Follow Up Recommendations SNF    Equipment Recommendations  None recommended by PT    Recommendations for Other Services       Precautions / Restrictions Precautions Precautions: Fall Restrictions Weight Bearing Restrictions: No      Mobility  Bed Mobility Overal bed mobility: Modified Independent             General bed mobility comments: Pt able to perform supine->sit with only use of rail. Pt impulsive and needs cues to wait for therapist. Once seated at EOB, upright posture noted.  Transfers Overall transfer level: Needs assistance Equipment used: Rolling walker (2 wheeled) Transfers: Sit to/from Stand Sit to Stand: Min assist;+2 physical assistance         General transfer comment: assist for transfers from elevated bed secodnary to B LE weakness. Once standing, slight post leaning noted, however  able to correct with cues  Ambulation/Gait Ambulation/Gait assistance: Min assist;+2 physical assistance Ambulation Distance (Feet): 20 Feet Assistive device: Rolling walker (2 wheeled) Gait Pattern/deviations: Decreased step length - right;Decreased step length - left;Leaning posteriorly     General Gait Details: Pt very unsteady with ambulation, needed +2 for safety and then 3rd person for equipment. Staggering noted. Needed BRW for support. Mobility performed on 3L of O2 with sats decreasing to 85% with exertion. Able to improve to 91% with resting and cues for pursed lip breathing  Stairs            Wheelchair Mobility    Modified Rankin (Stroke Patients Only)       Balance Overall balance assessment: Needs assistance Sitting-balance support: No upper extremity supported Sitting balance-Leahy Scale: Good     Standing balance support: Bilateral upper extremity supported Standing balance-Leahy Scale: Fair                               Pertinent Vitals/Pain Pain Assessment: No/denies pain    Home Living Family/patient expects to be discharged to:: Private residence Living Arrangements:  (parent and brother) Available Help at Discharge: Family;Available 24 hours/day Type of Home: House Home Access: Stairs to enter Entrance Stairs-Rails: Doctor, general practiceight;Left Entrance Stairs-Number of Steps: 3 Home Layout: One level Home Equipment: Walker - 2 wheels;Wheelchair - power;Cane - quad      Prior Function Level of Independence: Independent with assistive device(s)         Comments: Pt Mod I with amb with use of QC; pt  reports 4-5 falls in the last year secondary to dizziness     Hand Dominance        Extremity/Trunk Assessment   Upper Extremity Assessment Upper Extremity Assessment: Generalized weakness (B UE 3/5 tremors noted while resting; difficulty reaching RW)    Lower Extremity Assessment Lower Extremity Assessment: Generalized weakness (B LE  grossly 3/5; difficulty with LAQ)       Communication   Communication: No difficulties  Cognition Arousal/Alertness: Awake/alert Behavior During Therapy: Impulsive Overall Cognitive Status: Within Functional Limits for tasks assessed                                        General Comments      Exercises Other Exercises Other Exercises: Seated ther-ex performed including B LE ankle pumps, LAQ, and alt. marching. All ther-ex performed x 10 reps with pt fatigue present with increased reps. Increased work of breathing noted as well.   Assessment/Plan    PT Assessment Patient needs continued PT services  PT Problem List Decreased strength;Decreased activity tolerance;Decreased balance       PT Treatment Interventions Gait training;Stair training;Neuromuscular re-education;Balance training;Therapeutic exercise;Therapeutic activities;Patient/family education    PT Goals (Current goals can be found in the Care Plan section)  Acute Rehab PT Goals Patient Stated Goal: I'll get up to the chair PT Goal Formulation: With patient Time For Goal Achievement: 12/29/16 Potential to Achieve Goals: Good    Frequency Min 2X/week   Barriers to discharge        Co-evaluation               End of Session Equipment Utilized During Treatment: Gait belt;Oxygen Activity Tolerance: Patient limited by fatigue Patient left: in chair;with chair alarm set;with nursing/sitter in room Nurse Communication: Mobility status PT Visit Diagnosis: Unsteadiness on feet (R26.81);Muscle weakness (generalized) (M62.81);Difficulty in walking, not elsewhere classified (R26.2)    Time: 1610-9604 PT Time Calculation (min) (ACUTE ONLY): 25 min   Charges:   PT Evaluation $PT Eval Low Complexity: 1 Procedure PT Treatments $Therapeutic Exercise: 8-22 mins   PT G Codes:      Elizabeth Palau, PT, DPT [phone]   Howard [name] 12/15/2016, 10:36 AM

## 2016-12-15 NOTE — Discharge Instructions (Signed)
Follow-up with primary care physician in 5 days   follow-up with neurology Dr. Malvin JohnsPotter in a week Follow-up with gastroenterology Dr. Mechele CollinElliott in 2 weeks Follow-up with pulmonology in 2 weeks patient needs to be on CPAP Continue oxygen via nasal cannula Continue home health

## 2016-12-17 ENCOUNTER — Emergency Department: Payer: Medicare Other

## 2016-12-17 ENCOUNTER — Observation Stay
Admission: EM | Admit: 2016-12-17 | Discharge: 2016-12-19 | Disposition: A | Discharge status: Home / Self Care | Payer: Medicare Other | Attending: Internal Medicine | Admitting: Internal Medicine

## 2016-12-17 ENCOUNTER — Encounter: Payer: Self-pay | Admitting: Emergency Medicine

## 2016-12-17 DIAGNOSIS — J9622 Acute and chronic respiratory failure with hypercapnia: Secondary | ICD-10-CM | POA: Diagnosis not present

## 2016-12-17 DIAGNOSIS — G4733 Obstructive sleep apnea (adult) (pediatric): Secondary | ICD-10-CM | POA: Diagnosis not present

## 2016-12-17 DIAGNOSIS — Z6841 Body Mass Index (BMI) 40.0 and over, adult: Secondary | ICD-10-CM | POA: Diagnosis not present

## 2016-12-17 DIAGNOSIS — E662 Morbid (severe) obesity with alveolar hypoventilation: Secondary | ICD-10-CM | POA: Insufficient documentation

## 2016-12-17 DIAGNOSIS — F329 Major depressive disorder, single episode, unspecified: Secondary | ICD-10-CM | POA: Insufficient documentation

## 2016-12-17 DIAGNOSIS — Z7984 Long term (current) use of oral hypoglycemic drugs: Secondary | ICD-10-CM | POA: Diagnosis not present

## 2016-12-17 DIAGNOSIS — J9621 Acute and chronic respiratory failure with hypoxia: Secondary | ICD-10-CM | POA: Diagnosis not present

## 2016-12-17 DIAGNOSIS — K7031 Alcoholic cirrhosis of liver with ascites: Secondary | ICD-10-CM | POA: Insufficient documentation

## 2016-12-17 DIAGNOSIS — R400 Somnolence: Secondary | ICD-10-CM

## 2016-12-17 DIAGNOSIS — R531 Weakness: Secondary | ICD-10-CM | POA: Diagnosis not present

## 2016-12-17 DIAGNOSIS — Z79899 Other long term (current) drug therapy: Secondary | ICD-10-CM | POA: Diagnosis not present

## 2016-12-17 DIAGNOSIS — J449 Chronic obstructive pulmonary disease, unspecified: Secondary | ICD-10-CM | POA: Insufficient documentation

## 2016-12-17 DIAGNOSIS — E7800 Pure hypercholesterolemia, unspecified: Secondary | ICD-10-CM | POA: Insufficient documentation

## 2016-12-17 DIAGNOSIS — K7682 Hepatic encephalopathy: Secondary | ICD-10-CM

## 2016-12-17 DIAGNOSIS — Z9981 Dependence on supplemental oxygen: Secondary | ICD-10-CM | POA: Insufficient documentation

## 2016-12-17 DIAGNOSIS — I10 Essential (primary) hypertension: Secondary | ICD-10-CM | POA: Diagnosis not present

## 2016-12-17 DIAGNOSIS — I81 Portal vein thrombosis: Secondary | ICD-10-CM | POA: Diagnosis not present

## 2016-12-17 DIAGNOSIS — Z23 Encounter for immunization: Secondary | ICD-10-CM | POA: Insufficient documentation

## 2016-12-17 DIAGNOSIS — E785 Hyperlipidemia, unspecified: Secondary | ICD-10-CM | POA: Insufficient documentation

## 2016-12-17 DIAGNOSIS — F200 Paranoid schizophrenia: Secondary | ICD-10-CM | POA: Diagnosis not present

## 2016-12-17 DIAGNOSIS — E119 Type 2 diabetes mellitus without complications: Secondary | ICD-10-CM | POA: Insufficient documentation

## 2016-12-17 DIAGNOSIS — Z9119 Patient's noncompliance with other medical treatment and regimen: Secondary | ICD-10-CM | POA: Insufficient documentation

## 2016-12-17 DIAGNOSIS — R188 Other ascites: Secondary | ICD-10-CM

## 2016-12-17 DIAGNOSIS — K7290 Hepatic failure, unspecified without coma: Principal | ICD-10-CM

## 2016-12-17 DIAGNOSIS — R109 Unspecified abdominal pain: Secondary | ICD-10-CM

## 2016-12-17 LAB — BASIC METABOLIC PANEL
Anion gap: 6 (ref 5–15)
BUN: 11 mg/dL (ref 6–20)
CALCIUM: 9.4 mg/dL (ref 8.9–10.3)
CO2: 35 mmol/L — ABNORMAL HIGH (ref 22–32)
Chloride: 98 mmol/L — ABNORMAL LOW (ref 101–111)
Creatinine, Ser: 0.89 mg/dL (ref 0.61–1.24)
Glucose, Bld: 110 mg/dL — ABNORMAL HIGH (ref 65–99)
Potassium: 4.1 mmol/L (ref 3.5–5.1)
SODIUM: 139 mmol/L (ref 135–145)

## 2016-12-17 LAB — URINALYSIS, COMPLETE (UACMP) WITH MICROSCOPIC
Bacteria, UA: NONE SEEN
Bilirubin Urine: NEGATIVE
Glucose, UA: NEGATIVE mg/dL
Ketones, ur: NEGATIVE mg/dL
LEUKOCYTES UA: NEGATIVE
Nitrite: NEGATIVE
PH: 6 (ref 5.0–8.0)
Protein, ur: NEGATIVE mg/dL
SPECIFIC GRAVITY, URINE: 1.006 (ref 1.005–1.030)

## 2016-12-17 LAB — BLOOD GAS, VENOUS
ACID-BASE EXCESS: 13.8 mmol/L — AB (ref 0.0–2.0)
Bicarbonate: 40.9 mmol/L — ABNORMAL HIGH (ref 20.0–28.0)
O2 Saturation: 80.7 %
PATIENT TEMPERATURE: 37
pCO2, Ven: 66 mmHg — ABNORMAL HIGH (ref 44.0–60.0)
pH, Ven: 7.4 (ref 7.250–7.430)
pO2, Ven: 45 mmHg (ref 32.0–45.0)

## 2016-12-17 LAB — CBC
HCT: 31.5 % — ABNORMAL LOW (ref 40.0–52.0)
Hemoglobin: 9.9 g/dL — ABNORMAL LOW (ref 13.0–18.0)
MCH: 25.2 pg — ABNORMAL LOW (ref 26.0–34.0)
MCHC: 31.4 g/dL — ABNORMAL LOW (ref 32.0–36.0)
MCV: 80.2 fL (ref 80.0–100.0)
PLATELETS: 146 10*3/uL — AB (ref 150–440)
RBC: 3.92 MIL/uL — AB (ref 4.40–5.90)
RDW: 21.6 % — ABNORMAL HIGH (ref 11.5–14.5)
WBC: 7 10*3/uL (ref 3.8–10.6)

## 2016-12-17 LAB — AMMONIA: Ammonia: 89 umol/L — ABNORMAL HIGH (ref 9–35)

## 2016-12-17 LAB — HEPATIC FUNCTION PANEL
ALK PHOS: 98 U/L (ref 38–126)
ALT: 36 U/L (ref 17–63)
AST: 45 U/L — ABNORMAL HIGH (ref 15–41)
Albumin: 3.6 g/dL (ref 3.5–5.0)
Bilirubin, Direct: 0.1 mg/dL (ref 0.1–0.5)
Indirect Bilirubin: 0.6 mg/dL (ref 0.3–0.9)
TOTAL PROTEIN: 8.1 g/dL (ref 6.5–8.1)
Total Bilirubin: 0.7 mg/dL (ref 0.3–1.2)

## 2016-12-17 LAB — PROTIME-INR
INR: 1.24
PROTHROMBIN TIME: 15.7 s — AB (ref 11.4–15.2)

## 2016-12-17 MED ORDER — IPRATROPIUM-ALBUTEROL 0.5-2.5 (3) MG/3ML IN SOLN
3.0000 mL | Freq: Once | RESPIRATORY_TRACT | Status: AC
  Administered 2016-12-17: 3 mL via RESPIRATORY_TRACT
  Filled 2016-12-17: qty 3

## 2016-12-17 MED ORDER — LACTULOSE 10 GM/15ML PO SOLN
30.0000 g | Freq: Once | ORAL | Status: AC
  Administered 2016-12-17: 30 g via ORAL
  Filled 2016-12-17: qty 60

## 2016-12-17 NOTE — ED Triage Notes (Signed)
Pt comes into the ED via POV c/o new onset of dizziness and confusion that started yesterday.  Patient was discharged 2 days ago for seizures,.  Patient states he did have a 50 second long seizure earlier today but that has been normal for him. Patient has a h/o of encephalopathy and liver problems.  Patient in NAD at this time with even and unlabored respirations.  Patient is alert and oriented at this time.

## 2016-12-17 NOTE — ED Provider Notes (Signed)
[facility] Emergency Department Provider Note    First MD Initiated Contact with Patient 12/17/16 1941     (approximate)  I have reviewed the triage vital signs and the nursing notes.   HISTORY  Chief Complaint Dizziness    HPI Howard [name] is a 36 y.o. male with a history of alcoholic cirrhosis as well as COPD presents with dizziness and worsening confusion. Patient was just discharged from the hospital with persistent seizures as well as hepatic encephalopathy. His ammonia had cleared the patient was normal 2 days ago. States is been compliant with his medications. Mother and caregiver at bedside states that today he's been more confused and acting abnormal. They state that he has been taking his medications as distracted.   Past Medical History:  Diagnosis Date  . Alcoholic cirrhosis of liver with ascites (HCC)   . Anemia   . COPD (chronic obstructive pulmonary disease) (HCC)   . Depression   . Diabetes (HCC)   . Diabetes mellitus, type II (HCC)   . Esophageal varices (HCC)   . Heart disease   . Hyperlipemia   . Hypertension   . Liver disease   . Multiple thyroid nodules   . Portal hypertensive gastropathy   . Schizophrenia (HCC)   . Seizures (HCC)    Family History  Problem Relation Age of Onset  . Heart disease Mother   . Hypertension Mother   . Hyperlipidemia Mother   . Stroke Father   . Heart attack Father   . Hypertension Father   . Heart disease Father   . Alcohol abuse Father   . Heart disease Brother    Past Surgical History:  Procedure Laterality Date  . ESOPHAGOGASTRODUODENOSCOPY N/A 10/05/2015   Procedure: ESOPHAGOGASTRODUODENOSCOPY (EGD);  Surgeon: Elnita Maxwell, MD;  Location: [facility] ENDOSCOPY;  Service: Endoscopy;  Laterality: N/A;  . ESOPHAGOGASTRODUODENOSCOPY (EGD) WITH PROPOFOL N/A 08/03/2015   Procedure: ESOPHAGOGASTRODUODENOSCOPY (EGD) WITH PROPOFOL;  Surgeon: Elnita Maxwell, MD;  Location: [facility]  ENDOSCOPY;  Service: Endoscopy;  Laterality: N/A;  . ESOPHAGOGASTRODUODENOSCOPY (EGD) WITH PROPOFOL N/A 08/31/2015   Procedure: ESOPHAGOGASTRODUODENOSCOPY (EGD) WITH PROPOFOL;  Surgeon: Elnita Maxwell, MD;  Location: [facility] ENDOSCOPY;  Service: Endoscopy;  Laterality: N/A;  . ESOPHAGOGASTRODUODENOSCOPY (EGD) WITH PROPOFOL N/A 04/04/2016   Procedure: ESOPHAGOGASTRODUODENOSCOPY (EGD) WITH PROPOFOL;  Surgeon: Scot Jun, MD;  Location: [facility] ENDOSCOPY;  Service: Endoscopy;  Laterality: N/A;  . ESOPHAGOGASTRODUODENOSCOPY (EGD) WITH PROPOFOL N/A 11/13/2016   Procedure: ESOPHAGOGASTRODUODENOSCOPY (EGD) WITH PROPOFOL;  Surgeon: Wyline Mood, MD;  Location: [facility] ENDOSCOPY;  Service: Endoscopy;  Laterality: N/A;  . NO PAST SURGERIES     Patient Active Problem List   Diagnosis Date Noted  . Respiratory failure with hypoxia (HCC) 12/14/2016  . Epilepsy (HCC) 12/12/2016  . GIB (gastrointestinal bleeding) 10/05/2016  . Ascites 10/04/2016  . DNR (do not resuscitate) discussion 08/11/2016  . Palliative care by specialist 08/11/2016  . Muscle weakness (generalized)   . Acute upper GI bleed   . GI bleed 08/07/2016  . Alcoholic cirrhosis of liver (HCC) 07/09/2016  . Acute respiratory failure with hypercapnia (HCC) 07/09/2016  . Hyperglycemia 07/09/2016  . Seizure (HCC) 06/10/2016  . Altered mental status 05/12/2016  . Headache 04/29/2016  . OSA (obstructive sleep apnea) 04/29/2016  . Anemia 04/29/2016  . Thrombocytopenia (HCC) 04/29/2016  . Coagulopathy (HCC) 04/29/2016  . Obesity 04/29/2016  . Generalized weakness 04/29/2016  . Acute hepatic encephalopathy 03/14/2016  . Controlled type 2 diabetes mellitus without complication (HCC)  01/15/2016  . Normocytic anemia 12/12/2015  . Essential (primary) hypertension 12/11/2015  . Pure hypercholesterolemia 12/11/2015  . Fever 11/11/2015  . Ascites due to alcoholic cirrhosis (HCC) 11/11/2015  . Cirrhosis of liver with ascites (HCC) 11/11/2015  .  Hepatic encephalopathy (HCC) 10/16/2015  . Type 2 diabetes mellitus (HCC) 10/16/2015  . Acute renal failure (ARF) (HCC) 07/30/2015  . 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 Alcoholic cirrhosis (HCC) 07/19/2015  . Hypoxia 07/04/2015  . Elevated transaminase level 07/04/2015  . DOE (dyspnea on exertion) 07/04/2015  . Alcohol abuse 05/31/2015  . Hypertension 05/29/2015  . Paranoid schizophrenia (HCC) 03/20/2015      Prior to Admission medications   Medication Sig Start Date End Date Taking? Authorizing Provider  acamprosate (CAMPRAL) 333 MG tablet Take 2 tablets (666 mg total) by mouth 3 (three) times daily with meals. 08/19/16   Audery Amel, MD  albuterol (PROVENTIL HFA;VENTOLIN HFA) 108 (90 Base) MCG/ACT inhaler Inhale 2 puffs into the lungs every 6 (six) hours as needed for wheezing or shortness of breath. 10/08/16   Shaune Pollack, MD  citalopram (CELEXA) 20 MG tablet Take 1 tablet (20 mg total) by mouth every morning. 08/19/16   Audery Amel, MD  furosemide (LASIX) 20 MG tablet Take 1 tablet (20 mg total) by mouth daily. Patient not taking: Reported on 12/12/2016 05/15/16   Alford Highland, MD  lactulose (CHRONULAC) 10 GM/15ML solution Take 45 mLs (30 g total) by mouth every 4 (four) hours. 07/09/16   Katharina Caper, MD  levETIRAcetam (KEPPRA) 750 MG tablet Take 2 tablets (1,500 mg total) by mouth 2 (two) times daily. 12/11/16 02/09/17  Merrily Brittle, MD  losartan (COZAAR) 25 MG tablet Take 25 mg by mouth daily.    Historical Provider, MD  metFORMIN (GLUCOPHAGE) 500 MG tablet Take 1 tablet (500 mg total) by mouth 2 (two) times daily with a meal. 05/15/16   Alford Highland, MD  modafinil (PROVIGIL) 100 MG tablet Take 1 tablet (100 mg total) by mouth daily. Patient not taking: Reported on 12/12/2016 08/13/16   Auburn Bilberry, MD  Multiple Vitamin (MULTIVITAMIN WITH MINERALS) TABS tablet Take 1 tablet by mouth daily. 08/04/15   Ramonita Lab, MD  nadolol (CORGARD) 20 MG  tablet Take 20 mg by mouth daily.    Historical Provider, MD  OLANZapine (ZYPREXA) 15 MG tablet Take 2 tablets (30 mg total) by mouth at bedtime. 05/22/16   Audery Amel, MD  ondansetron (ZOFRAN) 4 MG tablet Take 1 tablet (4 mg total) by mouth every 8 (eight) hours as needed for nausea or vomiting. 08/26/16   Governor Rooks, MD  pantoprazole (PROTONIX) 40 MG tablet Take 1 tablet (40 mg total) by mouth 2 (two) times daily before a meal. Patient not taking: Reported on 12/12/2016 11/14/16   Altamese Dilling, MD  rifaximin (XIFAXAN) 550 MG TABS tablet Take 1 tablet (550 mg total) by mouth 2 (two) times daily. 07/09/16   Katharina Caper, MD  senna-docusate (SENOKOT-S) 8.6-50 MG tablet Take 2 tablets by mouth at bedtime as needed for mild constipation. Patient not taking: Reported on 12/12/2016 11/14/16   Altamese Dilling, MD  spironolactone (ALDACTONE) 50 MG tablet Take 1 tablet (50 mg total) by mouth 2 (two) times daily. 07/09/16   Katharina Caper, MD  sucralfate (CARAFATE) 1 g tablet Take 1 g by mouth 4 (four) times daily.    Historical Provider, MD  traZODone (DESYREL) 100 MG tablet Take 100 mg by mouth at bedtime.  Historical Provider, MD    Allergies Patient has no known allergies.    Social History Social History  Substance Use Topics  . Smoking status: Never Smoker  . Smokeless tobacco: Never Used  . Alcohol use 30.0 oz/week    50 Cans of beer per week     Comment: last drink 9 days ago    Review of Systems Patient denies headaches, rhinorrhea, blurry vision, numbness, shortness of breath, chest pain, edema, cough, abdominal pain, nausea, vomiting, diarrhea, dysuria, fevers, rashes or hallucinations unless otherwise stated above in HPI. ____________________________________________   PHYSICAL EXAM:  VITAL SIGNS: Vitals:   12/17/16 2030 12/17/16 2100  BP: 132/74 125/63  Pulse: 77 78  Resp: (!) 22 20  Temp:      Constitutional: Alert, chronically ill appearing but in no  acute distress. Eyes: Conjunctivae are normal. PERRL. EOMI. Head: Atraumatic. Nose: No congestion/rhinnorhea. Mouth/Throat: Mucous membranes are moist.  Oropharynx non-erythematous. Neck: No stridor. Painless ROM. No cervical spine tenderness to palpation Hematological/Lymphatic/Immunilogical: No cervical lymphadenopathy. Cardiovascular: Normal rate, regular rhythm. Grossly normal heart sounds.  Good peripheral circulation. Respiratory: Normal respiratory effort.  No retractions. Diminished breathsounds due to habitus Gastrointestinal: morbidly obese, Soft and nontender. No distention. No abdominal bruits. No CVA tenderness. Musculoskeletal: No lower extremity tenderness.  2+ bilateral edema.  No joint effusions. Neurologic:  + liver flap, no facial droop, MAE spontaneously Skin:  Skin is warm, dry and intact. No rash noted. Psychiatric: Mood and affect are normal. Speech and behavior are normal.  ____________________________________________   LABS (all labs ordered are listed, but only abnormal results are displayed)  Results for orders placed or performed during the hospital encounter of 12/17/16 (from the past 24 hour(s))  Basic metabolic panel     Status: Abnormal   Collection Time: 12/17/16  3:55 PM  Result Value Ref Range   Sodium 139 135 - 145 mmol/L   Potassium 4.1 3.5 - 5.1 mmol/L   Chloride 98 (L) 101 - 111 mmol/L   CO2 35 (H) 22 - 32 mmol/L   Glucose, Bld 110 (H) 65 - 99 mg/dL   BUN 11 6 - 20 mg/dL   Creatinine, Ser 1.610.89 0.61 - 1.24 mg/dL   Calcium 9.4 8.9 - 09.610.3 mg/dL   GFR calc non Af Amer >60 >60 mL/min   GFR calc Af Amer >60 >60 mL/min   Anion gap 6 5 - 15  CBC     Status: Abnormal   Collection Time: 12/17/16  3:55 PM  Result Value Ref Range   WBC 7.0 3.8 - 10.6 K/uL   RBC 3.92 (L) 4.40 - 5.90 MIL/uL   Hemoglobin 9.9 (L) 13.0 - 18.0 g/dL   HCT 04.531.5 (L) 40.940.0 - 81.152.0 %   MCV 80.2 80.0 - 100.0 fL   MCH 25.2 (L) 26.0 - 34.0 pg   MCHC 31.4 (L) 32.0 - 36.0 g/dL    RDW 91.421.6 (H) 78.211.5 - 14.5 %   Platelets 146 (L) 150 - 440 K/uL  Urinalysis, Complete w Microscopic     Status: Abnormal   Collection Time: 12/17/16  3:55 PM  Result Value Ref Range   Color, Urine YELLOW (A) YELLOW   APPearance CLEAR (A) CLEAR   Specific Gravity, Urine 1.006 1.005 - 1.030   pH 6.0 5.0 - 8.0   Glucose, UA NEGATIVE NEGATIVE mg/dL   Hgb urine dipstick LARGE (A) NEGATIVE   Bilirubin Urine NEGATIVE NEGATIVE   Ketones, ur NEGATIVE NEGATIVE mg/dL   Protein, ur NEGATIVE  NEGATIVE mg/dL   Nitrite NEGATIVE NEGATIVE   Leukocytes, UA NEGATIVE NEGATIVE   RBC / HPF 6-30 0 - 5 RBC/hpf   WBC, UA 0-5 0 - 5 WBC/hpf   Bacteria, UA NONE SEEN NONE SEEN   Squamous Epithelial / LPF 0-5 (A) NONE SEEN   Mucous PRESENT    Hyaline Casts, UA PRESENT   Ammonia     Status: Abnormal   Collection Time: 12/17/16  3:55 PM  Result Value Ref Range   Ammonia 89 (H) 9 - 35 umol/L  Hepatic function panel     Status: Abnormal   Collection Time: 12/17/16  3:55 PM  Result Value Ref Range   Total Protein 8.1 6.5 - 8.1 g/dL   Albumin 3.6 3.5 - 5.0 g/dL   AST 45 (H) 15 - 41 U/L   ALT 36 17 - 63 U/L   Alkaline Phosphatase 98 38 - 126 U/L   Total Bilirubin 0.7 0.3 - 1.2 mg/dL   Bilirubin, Direct 0.1 0.1 - 0.5 mg/dL   Indirect Bilirubin 0.6 0.3 - 0.9 mg/dL  Blood gas, venous     Status: Abnormal   Collection Time: 12/17/16  8:44 PM  Result Value Ref Range   pH, Ven 7.40 7.250 - 7.430   pCO2, Ven 66 (H) 44.0 - 60.0 mmHg   pO2, Ven 45.0 32.0 - 45.0 mmHg   Bicarbonate 40.9 (H) 20.0 - 28.0 mmol/L   Acid-Base Excess 13.8 (H) 0.0 - 2.0 mmol/L   O2 Saturation 80.7 %   Patient temperature 37.0    Collection site VENOUS    Sample type VENOUS   Protime-INR     Status: Abnormal   Collection Time: 12/17/16  8:45 PM  Result Value Ref Range   Prothrombin Time 15.7 (H) 11.4 - 15.2 seconds   INR 1.24    ____________________________________________  EKG My review and personal interpretation at Time: 15:49     Indication: confusion  Rate: 85  Rhythm: sinus Axis: normal Other: normal intervals, no acute ischemia ____________________________________________  RADIOLOGY   ____________________________________________   PROCEDURES  Procedure(s) performed:  Procedures    Critical Care performed: no ____________________________________________   INITIAL IMPRESSION / ASSESSMENT AND PLAN / ED COURSE  Pertinent labs & imaging results that were available during my care of the patient were reviewed by me and considered in my medical decision making (see chart for details).  DDX: hepatic encephalopathy, dehydration, chf, copd,  Howard [name] is a 36 y.o. who presents to the ED with symptoms as described above. Arrives afebrile. Brief episode of hypoxia but was not on his home O2. Blood work shows evidence of elevated ammonia significant elevated from 2 days ago. Blood work is otherwise reassuring. No evidence of significant hypercapnia. Does have a compensated alkalosis. No evidence of pneumonia. Based on his presentation do feel patient will require admission for treatment of his hepatic encephalopathy.  Have discussed with the patient and available family all diagnostics and treatments performed thus far and all questions were answered to the best of my ability. The patient demonstrates understanding and agreement with plan.       ____________________________________________   FINAL CLINICAL IMPRESSION(S) / ED DIAGNOSES  Final diagnoses:  Hepatic encephalopathy (HCC)      NEW MEDICATIONS STARTED DURING THIS VISIT:  New Prescriptions   No medications on file     Note:  This document was prepared using Dragon voice recognition software and may include unintentional dictation errors.    Willy Eddy, MD  12/17/16 2352  

## 2016-12-17 NOTE — H&P (Addendum)
[facility] - [place] at [facility]   PATIENT NAME: Howard [name]    MR#:  [id number]  DATE OF BIRTH:  May 27, 1981  DATE OF ADMISSION:  12/17/2016  PRIMARY CARE PHYSICIAN: Marisue Ivan, MD   REQUESTING/REFERRING PHYSICIAN:   CHIEF COMPLAINT:   Chief Complaint  Patient presents with  . Dizziness    HISTORY OF PRESENT ILLNESS: Howard [name]  is a 36 y.o. male with a known history of Alcoholic liver cirrhosis, anemia, COPD, diabetes, esophageal varices, hyperlipidemia, obesity, obstructive sleep apnea, who presents to the hospital with complaints of feeling confused, dizzy since yesterday. Apparently patient was discharged from the hospital 2 days ago for recurrent seizure, he was postictal. He was discharged in good condition. He was noted to be more to be more confused and dizzy by his mother, he was also complaining of some abdominal pain for the past 1-2 days, pain was described as achiness, intermittent with no alleviating or aggravating factors in all abdomen, mostly in the epigastric area. Patient denies any fevers or chills, however, admits of fatigue and weakness, not able to even walk  at home, per his mother. On arrival to the hospital. He was noted to have markedly elevated ammonia level and recommendations were made to admit.   PAST MEDICAL HISTORY:   Past Medical History:  Diagnosis Date  . Alcoholic cirrhosis of liver with ascites (HCC)   . Anemia   . COPD (chronic obstructive pulmonary disease) (HCC)   . Depression   . Diabetes (HCC)   . Diabetes mellitus, type II (HCC)   . Esophageal varices (HCC)   . Heart disease   . Hyperlipemia   . Hypertension   . Liver disease   . Multiple thyroid nodules   . Portal hypertensive gastropathy   . Schizophrenia (HCC)   . Seizures (HCC)     PAST SURGICAL HISTORY: Past Surgical History:  Procedure Laterality Date  . ESOPHAGOGASTRODUODENOSCOPY N/A 10/05/2015   Procedure: ESOPHAGOGASTRODUODENOSCOPY  (EGD);  Surgeon: Elnita Maxwell, MD;  Location: [facility] ENDOSCOPY;  Service: Endoscopy;  Laterality: N/A;  . ESOPHAGOGASTRODUODENOSCOPY (EGD) WITH PROPOFOL N/A 08/03/2015   Procedure: ESOPHAGOGASTRODUODENOSCOPY (EGD) WITH PROPOFOL;  Surgeon: Elnita Maxwell, MD;  Location[facility] ENDOSCOPY;  Service: Endoscopy;  Laterality: N/A;  . ESOPHAGOGASTRODUODENOSCOPY (EGD) WITH PROPOFOL N/A 08/31/2015   Procedure: ESOPHAGOGASTRODUODENOSCOPY (EGD) WITH PROPOFOL;  Surgeon: Elnita Maxwell, MD;  Location: [facility] ENDOSCOPY;  Service: Endoscopy;  Laterality: N/A;  . ESOPHAGOGASTRODUODENOSCOPY (EGD) WITH PROPOFOL N/A 04/04/2016   Procedure: ESOPHAGOGASTRODUODENOSCOPY (EGD) WITH PROPOFOL;  Surgeon: Scot Jun, MD;  Location: [facility] ENDOSCOPY;  Service: Endoscopy;  Laterality: N/A;  . ESOPHAGOGASTRODUODENOSCOPY (EGD) WITH PROPOFOL N/A 11/13/2016   Procedure: ESOPHAGOGASTRODUODENOSCOPY (EGD) WITH PROPOFOL;  Surgeon: Wyline Mood, MD;  Location: [facility] ENDOSCOPY;  Service: Endoscopy;  Laterality: N/A;  . NO PAST SURGERIES      SOCIAL HISTORY:  Social History  Substance Use Topics  . Smoking status: Never Smoker  . Smokeless tobacco: Never Used  . Alcohol use 30.0 oz/week    50 Cans of beer per week     Comment: last drink 9 days ago    FAMILY HISTORY:  Family History  Problem Relation Age of Onset  . Heart disease Mother   . Hypertension Mother   . Hyperlipidemia Mother   . Stroke Father   . Heart attack Father   . Hypertension Father   . Heart disease Father   . Alcohol abuse Father   . Heart disease Brother  DRUG ALLERGIES: No Known Allergies  Review of Systems  Constitutional: Positive for chills and malaise/fatigue. Negative for fever and weight loss.  HENT: Negative for congestion.   Eyes: Negative for blurred vision and double vision.  Respiratory: Negative for cough, sputum production, shortness of breath and wheezing.   Cardiovascular: Positive for chest pain, palpitations and  leg swelling. Negative for orthopnea and PND.  Gastrointestinal: Positive for abdominal pain, diarrhea and nausea. Negative for blood in stool, constipation and vomiting.  Genitourinary: Positive for dysuria. Negative for frequency, hematuria and urgency.  Musculoskeletal: Negative for falls.  Neurological: Negative for dizziness, tremors, focal weakness and headaches.  Endo/Heme/Allergies: Does not bruise/bleed easily.  Psychiatric/Behavioral: Negative for depression. The patient does not have insomnia.     MEDICATIONS AT HOME:  Prior to Admission medications   Medication Sig Start Date End Date Taking? Authorizing Provider  acamprosate (CAMPRAL) 333 MG tablet Take 2 tablets (666 mg total) by mouth 3 (three) times daily with meals. 08/19/16  Yes Audery Amel, MD  albuterol (PROVENTIL HFA;VENTOLIN HFA) 108 (90 Base) MCG/ACT inhaler Inhale 2 puffs into the lungs every 6 (six) hours as needed for wheezing or shortness of breath. 10/08/16  Yes Shaune Pollack, MD  citalopram (CELEXA) 20 MG tablet Take 1 tablet (20 mg total) by mouth every morning. 08/19/16  Yes Audery Amel, MD  lactulose (CHRONULAC) 10 GM/15ML solution Take 45 mLs (30 g total) by mouth every 4 (four) hours. Patient taking differently: Take 30 g by mouth 4 (four) times daily.  07/09/16  Yes Katharina Caper, MD  levETIRAcetam (KEPPRA) 750 MG tablet Take 2 tablets (1,500 mg total) by mouth 2 (two) times daily. 12/11/16 02/09/17 Yes Merrily Brittle, MD  losartan (COZAAR) 25 MG tablet Take 25 mg by mouth daily.   Yes Historical Provider, MD  metFORMIN (GLUCOPHAGE) 500 MG tablet Take 1 tablet (500 mg total) by mouth 2 (two) times daily with a meal. 05/15/16  Yes Alford Highland, MD  Multiple Vitamin (MULTIVITAMIN WITH MINERALS) TABS tablet Take 1 tablet by mouth daily. 08/04/15  Yes Ramonita Lab, MD  nadolol (CORGARD) 20 MG tablet Take 20 mg by mouth daily.   Yes Historical Provider, MD  OLANZapine (ZYPREXA) 15 MG tablet Take 2 tablets (30 mg  total) by mouth at bedtime. 05/22/16  Yes Audery Amel, MD  ondansetron (ZOFRAN) 4 MG tablet Take 1 tablet (4 mg total) by mouth every 8 (eight) hours as needed for nausea or vomiting. 08/26/16  Yes Governor Rooks, MD  rifaximin (XIFAXAN) 550 MG TABS tablet Take 1 tablet (550 mg total) by mouth 2 (two) times daily. 07/09/16  Yes Katharina Caper, MD  spironolactone (ALDACTONE) 50 MG tablet Take 1 tablet (50 mg total) by mouth 2 (two) times daily. 07/09/16  Yes Katharina Caper, MD  sucralfate (CARAFATE) 1 g tablet Take 1 g by mouth 4 (four) times daily.   Yes Historical Provider, MD  traZODone (DESYREL) 100 MG tablet Take 100 mg by mouth at bedtime as needed for sleep.    Yes Historical Provider, MD      PHYSICAL EXAMINATION:   VITAL SIGNS: Blood pressure 115/73, pulse 80, temperature 99.1 F (37.3 C), temperature source Oral, resp. rate 19, height 6\' 3"  (1.905 m), weight (!) 166 kg (366 lb), SpO2 100 %.  GENERAL:  36 y.o.-year-old patient lying in the bed with no acute distress, Somewhat somnolent laying on the stretcher. He opens his eyes, converses and then just down to sleep EYES: Pupils equal,  round, reactive to light and accommodation. No scleral icterus. Extraocular muscles intact.  HEENT: Head atraumatic, normocephalic. Oropharynx and nasopharynx clear.  NECK:  Supple, no jugular venous distention. No thyroid enlargement, no tenderness.  LUNGS: Normal breath sounds bilaterally, no wheezing, rales,rhonchi or crepitation. No use of accessory muscles of respiration.  CARDIOVASCULAR: S1, S2 normal. No murmurs, rubs, or gallops.  ABDOMEN: Soft, tender diffusely, mostly in epigastric area, but no rebound or guarding was noted, nondistended. Bowel sounds present. No organomegaly or mass.  EXTREMITIES: 1-2+ lower extremity and pedal edema bilaterally, no cyanosis, or clubbing.  NEUROLOGIC: Cranial nerves II through XII are intact. Muscle strength 5/5 in all extremities. Sensation intact. Gait not  checked.  PSYCHIATRIC: The patient is alert and oriented x 3.  SKIN: No obvious rash, lesion, or ulcer.   LABORATORY PANEL:   CBC  Recent Labs Lab 12/11/16 1934 12/12/16 1938 12/13/16 1002 12/15/16 0512 12/17/16 1555  WBC 7.6 7.2 7.8 6.4 7.0  HGB 9.4* 9.3* 9.3* 9.1* 9.9*  HCT 30.7* 30.3* 30.4* 29.5* 31.5*  PLT 134* 139* 135* 123* 146*  MCV 79.7* 79.6* 80.5 80.9 80.2  MCH 24.4* 24.4* 24.5* 25.0* 25.2*  MCHC 30.6* 30.7* 30.4* 31.0* 31.4*  RDW 21.1* 21.1* 21.6* 20.9* 21.6*  LYMPHSABS 1.2 1.3  --  1.3  --   MONOABS 0.9 0.9  --  0.7  --   EOSABS 0.2 0.2  --  0.2  --   BASOSABS 0.1 0.1  --  0.1  --    ------------------------------------------------------------------------------------------------------------------  Chemistries   Recent Labs Lab 12/17/16 1555  NA 139  K 4.1  CL 98*  CO2 35*  GLUCOSE 110*  BUN 11  CREATININE 0.89  CALCIUM 9.4  AST 45*  ALT 36  ALKPHOS 98  BILITOT 0.7   ------------------------------------------------------------------------------------------------------------------  Cardiac Enzymes  Recent Labs Lab 12/12/16 1938  TROPONINI <0.03   ------------------------------------------------------------------------------------------------------------------  RADIOLOGY: Dg Chest 2 View  Result Date: 12/17/2016 CLINICAL DATA:  New onset dizziness and confusion. EXAM: CHEST  2 VIEW COMPARISON:  Chest x-ray 12/13/2016 FINDINGS: The cardiac silhouette, mediastinal and hilar contours are within normal limits and stable. There are bibasilar scarring changes but no infiltrates, edema or effusions. The bony thorax is intact. IMPRESSION: Bibasilar scarring changes.  No infiltrates or effusions. Electronically Signed   By: Rudie MeyerP.  Gallerani M.D.   On: 12/17/2016 20:22    EKG: Orders placed or performed during the hospital encounter of 12/17/16  . EKG 12-Lead  . EKG 12-Lead  . ED EKG  . ED EKG   EKG revealed normal sinus rhythm at 85 bpm, normal  axis. Negative CT changes IMPRESSION AND PLAN:  Active Problems:   Hepatic encephalopathy (HCC)   Generalized weakness   Hypoxia   Diffuse abdominal pain  #1. Hepatic encephalopathy, admitted patient to medical floor, continue his medications, patient claims taking his medications according to prescription, follow clinically #2. Generalized weakness, the physical therapist involved recommendations, could be related to hypoxia on exertion, follow O2 sats on exertion  #3. Diffuse abdominal pain, etiology is unclear, continue Carafate, get ultrasound of abdomen to rule out ascites, worsening portal venous thrombosis, patient's temperature is normal,  urinalysis was unremarkable.   #4. Hypoxia and hypercapnia. very likely due to obstructive sleep apnea,  , initiate CPAP, chest x-ray showed chronic changes   All the records are reviewed and case discussed with ED provider. Management plans discussed with the patient, family and they are in agreement.  CODE STATUS: Code Status History  Date Active Date Inactive Code Status Order ID Comments User Context   12/12/2016 11:40 PM 12/15/2016  8:28 PM Full Code [id number]  Arnaldo Natal, MD Inpatient   11/24/2016  1:11 AM 11/25/2016  4:42 PM Full Code [id number]  Tonye Royalty, DO Inpatient   11/12/2016  3:36 PM 11/14/2016  2:36 PM Full Code [id number]  Auburn Bilberry, MD Inpatient   10/04/2016  6:04 PM 10/08/2016  3:44 PM Full Code [id number]  Shaune Pollack, MD Inpatient   09/22/2016  2:29 AM 09/23/2016  6:38 PM Full Code [id number]  Oralia Manis, MD Inpatient   08/07/2016  7:37 PM 08/12/2016  8:51 PM Full Code [id number]  Gracelyn Nurse, MD Inpatient   07/07/2016  1:40 AM 07/09/2016  6:29 PM Full Code [id number]  Ihor Austin, MD Inpatient   06/10/2016  6:21 AM 06/11/2016  8:25 PM Full Code [id number]  Ihor Austin, MD Inpatient   05/12/2016  3:55 AM 05/15/2016  2:57 PM Full Code [id number]  Ihor Austin, MD Inpatient   04/28/2016  2:54 AM 04/29/2016  6:23 PM Full  Code [id number]  Tonye Royalty, DO Inpatient   03/14/2016 12:30 PM 03/16/2016  3:41 PM Full Code [id number]  Shaune Pollack, MD Inpatient   03/03/2016 12:13 PM 03/06/2016  2:26 PM Full Code [id number]  Milagros Loll, MD ED   02/10/2016  5:54 PM 02/12/2016  7:34 PM Full Code [id number]  Ramonita Lab, MD Inpatient   11/11/2015  5:16 AM 11/13/2015  6:00 PM Full Code [id number]  Ihor Austin, MD Inpatient   11/07/2015 10:02 PM 11/09/2015  4:56 PM Full Code [id number]  Enid Baas, MD Inpatient   10/28/2015  8:14 PM 10/29/2015  2:26 PM Full Code [id number]  Altamese Dilling, MD Inpatient   10/16/2015 11:47 PM 10/17/2015  6:00 PM Full Code [id number]  Oralia Manis, MD Inpatient   07/30/2015  5:35 PM 08/04/2015  8:34 PM Full Code [id number]  Enedina Finner, MD Inpatient   07/05/2015  1:16 AM 07/08/2015  7:22 PM Full Code [id number]  Oralia Manis, MD Inpatient   05/30/2015  7:28 PM 06/01/2015  3:36 PM Full Code [id number]  Audery Amel, MD Inpatient       TOTAL TIME TAKING CARE OF THIS PATIENT: 50 minutes.    Katharina Caper M.D on 12/17/2016 at 10:21 PM  Between 7am to 6pm - Pager - [phone] After 6pm go to [email or web address] - password [email or web address] [facility]  [place] [facility]  Office  [phone]  CC: Primary care physician; Marisue Ivan, MD

## 2016-12-17 NOTE — ED Notes (Signed)
Patient given Meal tray.   

## 2016-12-18 ENCOUNTER — Telehealth: Payer: Self-pay

## 2016-12-18 ENCOUNTER — Observation Stay: Payer: Medicare Other

## 2016-12-18 DIAGNOSIS — K7290 Hepatic failure, unspecified without coma: Secondary | ICD-10-CM

## 2016-12-18 DIAGNOSIS — G9340 Encephalopathy, unspecified: Secondary | ICD-10-CM | POA: Diagnosis not present

## 2016-12-18 LAB — BLOOD GAS, ARTERIAL
Acid-Base Excess: 14.9 mmol/L — ABNORMAL HIGH (ref 0.0–2.0)
Bicarbonate: 42.8 mmol/L — ABNORMAL HIGH (ref 20.0–28.0)
FIO2: 32
O2 Saturation: 87.8 %
PCO2 ART: 74 mmHg — AB (ref 32.0–48.0)
PH ART: 7.37 (ref 7.350–7.450)
Patient temperature: 37
pO2, Arterial: 56 mmHg — ABNORMAL LOW (ref 83.0–108.0)

## 2016-12-18 LAB — COMPREHENSIVE METABOLIC PANEL
ALBUMIN: 3.5 g/dL (ref 3.5–5.0)
ALK PHOS: 91 U/L (ref 38–126)
ALK PHOS: 94 U/L (ref 38–126)
ALT: 31 U/L (ref 17–63)
ALT: 34 U/L (ref 17–63)
ANION GAP: 6 (ref 5–15)
ANION GAP: 5 (ref 5–15)
AST: 35 U/L (ref 15–41)
AST: 41 U/L (ref 15–41)
Albumin: 3.4 g/dL — ABNORMAL LOW (ref 3.5–5.0)
BILIRUBIN TOTAL: 0.7 mg/dL (ref 0.3–1.2)
BUN: 14 mg/dL (ref 6–20)
BUN: 14 mg/dL (ref 6–20)
CALCIUM: 9.2 mg/dL (ref 8.9–10.3)
CO2: 36 mmol/L — ABNORMAL HIGH (ref 22–32)
CO2: 36 mmol/L — AB (ref 22–32)
Calcium: 9.3 mg/dL (ref 8.9–10.3)
Chloride: 101 mmol/L (ref 101–111)
Chloride: 98 mmol/L — ABNORMAL LOW (ref 101–111)
Creatinine, Ser: 1.04 mg/dL (ref 0.61–1.24)
Creatinine, Ser: 0.96 mg/dL (ref 0.61–1.24)
GFR calc Af Amer: >60 mL/min (ref >60)
GFR calc non Af Amer: >60 mL/min (ref >60)
GFR calc non Af Amer: >60 mL/min (ref >60)
GLUCOSE: 113 mg/dL — AB (ref 65–99)
Glucose, Bld: 108 mg/dL — ABNORMAL HIGH (ref 65–99)
POTASSIUM: 4.3 mmol/L (ref 3.5–5.1)
Potassium: 3.8 mmol/L (ref 3.5–5.1)
SODIUM: 139 mmol/L (ref 135–145)
Sodium: 143 mmol/L (ref 135–145)
TOTAL PROTEIN: 7.6 g/dL (ref 6.5–8.1)
Total Bilirubin: 0.8 mg/dL (ref 0.3–1.2)
Total Protein: 8 g/dL (ref 6.5–8.1)

## 2016-12-18 LAB — GLUCOSE, CAPILLARY
GLUCOSE-CAPILLARY: 90 mg/dL (ref 65–99)
GLUCOSE-CAPILLARY: 89 mg/dL (ref 65–99)
GLUCOSE-CAPILLARY: 108 mg/dL — AB (ref 65–99)
Glucose-Capillary: 92 mg/dL (ref 65–99)
Glucose-Capillary: 88 mg/dL (ref 65–99)

## 2016-12-18 LAB — TSH: TSH: 0.899 u[IU]/mL (ref 0.350–4.500)

## 2016-12-18 LAB — URINE DRUG SCREEN, QUALITATIVE (ARMC ONLY)
AMPHETAMINES, UR SCREEN: NOT DETECTED
BENZODIAZEPINE, UR SCRN: POSITIVE — AB
Barbiturates, Ur Screen: NOT DETECTED
COCAINE METABOLITE, UR ~~LOC~~: NOT DETECTED
Cannabinoid 50 Ng, Ur ~~LOC~~: NOT DETECTED
MDMA (Ecstasy)Ur Screen: NOT DETECTED
METHADONE SCREEN, URINE: NOT DETECTED
OPIATE, UR SCREEN: NOT DETECTED
Phencyclidine (PCP) Ur S: NOT DETECTED
Tricyclic, Ur Screen: NOT DETECTED

## 2016-12-18 LAB — CBC
HCT: 30.4 % — ABNORMAL LOW (ref 40.0–52.0)
HEMOGLOBIN: 9.5 g/dL — AB (ref 13.0–18.0)
MCH: 25.4 pg — ABNORMAL LOW (ref 26.0–34.0)
MCHC: 31.3 g/dL — AB (ref 32.0–36.0)
MCV: 81.1 fL (ref 80.0–100.0)
Platelets: 123 10*3/uL — ABNORMAL LOW (ref 150–440)
RBC: 3.74 MIL/uL — AB (ref 4.40–5.90)
RDW: 21.8 % — ABNORMAL HIGH (ref 11.5–14.5)
WBC: 6.5 10*3/uL (ref 3.8–10.6)

## 2016-12-18 LAB — AMMONIA: Ammonia: 86 umol/L — ABNORMAL HIGH (ref 9–35)

## 2016-12-18 MED ORDER — ACETAMINOPHEN 325 MG PO TABS
650.0000 mg | ORAL_TABLET | Freq: Four times a day (QID) | ORAL | Status: DC | PRN
  Administered 2016-12-19: 650 mg via ORAL
  Filled 2016-12-18: qty 2

## 2016-12-18 MED ORDER — ACAMPROSATE CALCIUM 333 MG PO TBEC
666.0000 mg | DELAYED_RELEASE_TABLET | Freq: Three times a day (TID) | ORAL | Status: DC
  Administered 2016-12-18 – 2016-12-19 (×5): 666 mg via ORAL
  Filled 2016-12-18 (×7): qty 2

## 2016-12-18 MED ORDER — SPIRONOLACTONE 25 MG PO TABS
50.0000 mg | ORAL_TABLET | Freq: Two times a day (BID) | ORAL | Status: DC
  Administered 2016-12-18 – 2016-12-19 (×4): 50 mg via ORAL
  Filled 2016-12-18 (×4): qty 2

## 2016-12-18 MED ORDER — ENOXAPARIN SODIUM 40 MG/0.4ML ~~LOC~~ SOLN
40.0000 mg | SUBCUTANEOUS | Status: DC
  Administered 2016-12-18: 40 mg via SUBCUTANEOUS
  Filled 2016-12-18: qty 0.4

## 2016-12-18 MED ORDER — SODIUM CHLORIDE 0.9% FLUSH: 3.0000 mL | Freq: Two times a day (BID) | INTRAVENOUS | Status: DC

## 2016-12-18 MED ORDER — LACTULOSE 10 GM/15ML PO SOLN
30.0000 g | Freq: Four times a day (QID) | ORAL | Status: DC
  Administered 2016-12-18 – 2016-12-19 (×6): 30 g via ORAL
  Filled 2016-12-18 (×6): qty 60

## 2016-12-18 MED ORDER — NADOLOL 20 MG PO TABS
20.0000 mg | ORAL_TABLET | Freq: Every day | ORAL | Status: DC
  Administered 2016-12-18 – 2016-12-19 (×2): 20 mg via ORAL
  Filled 2016-12-18 (×2): qty 1

## 2016-12-18 MED ORDER — TRAZODONE HCL 100 MG PO TABS
100.0000 mg | ORAL_TABLET | Freq: Every evening | ORAL | Status: DC | PRN
  Administered 2016-12-18: 100 mg via ORAL
  Filled 2016-12-18: qty 1

## 2016-12-18 MED ORDER — ONDANSETRON HCL 4 MG/2ML IJ SOLN: 4.0000 mg | Freq: Four times a day (QID) | INTRAMUSCULAR | Status: DC | PRN

## 2016-12-18 MED ORDER — ONDANSETRON HCL 4 MG PO TABS: 4.0000 mg | ORAL_TABLET | Freq: Three times a day (TID) | ORAL | Status: DC | PRN

## 2016-12-18 MED ORDER — SODIUM CHLORIDE 0.9 % IV SOLN: 250.0000 mL | INTRAVENOUS | Status: DC | PRN

## 2016-12-18 MED ORDER — CITALOPRAM HYDROBROMIDE 20 MG PO TABS
20.0000 mg | ORAL_TABLET | ORAL | Status: DC
  Administered 2016-12-18 – 2016-12-19 (×2): 20 mg via ORAL
  Filled 2016-12-18 (×2): qty 1

## 2016-12-18 MED ORDER — SUCRALFATE 1 G PO TABS
1.0000 g | ORAL_TABLET | Freq: Four times a day (QID) | ORAL | Status: DC
  Administered 2016-12-18 – 2016-12-19 (×6): 1 g via ORAL
  Filled 2016-12-18 (×7): qty 1

## 2016-12-18 MED ORDER — ALBUTEROL SULFATE (2.5 MG/3ML) 0.083% IN NEBU: 2.5000 mg | INHALATION_SOLUTION | Freq: Four times a day (QID) | RESPIRATORY_TRACT | Status: DC | PRN

## 2016-12-18 MED ORDER — INFLUENZA VAC SPLIT QUAD 0.5 ML IM SUSY
0.5000 mL | PREFILLED_SYRINGE | INTRAMUSCULAR | Status: AC
  Administered 2016-12-19: 0.5 mL via INTRAMUSCULAR
  Filled 2016-12-18: qty 0.5

## 2016-12-18 MED ORDER — SODIUM CHLORIDE 0.9% FLUSH
3.0000 mL | Freq: Two times a day (BID) | INTRAVENOUS | Status: DC
  Administered 2016-12-18 – 2016-12-19 (×2): 3 mL via INTRAVENOUS

## 2016-12-18 MED ORDER — LEVETIRACETAM 500 MG PO TABS
1500.0000 mg | ORAL_TABLET | Freq: Two times a day (BID) | ORAL | Status: DC
  Administered 2016-12-18 – 2016-12-19 (×4): 1500 mg via ORAL
  Filled 2016-12-18 (×4): qty 3

## 2016-12-18 MED ORDER — LOSARTAN POTASSIUM 25 MG PO TABS
25.0000 mg | ORAL_TABLET | Freq: Every day | ORAL | Status: DC
  Administered 2016-12-18 – 2016-12-19 (×2): 25 mg via ORAL
  Filled 2016-12-18 (×2): qty 1

## 2016-12-18 MED ORDER — ONDANSETRON HCL 4 MG PO TABS: 4.0000 mg | ORAL_TABLET | Freq: Four times a day (QID) | ORAL | Status: DC | PRN

## 2016-12-18 MED ORDER — ENOXAPARIN SODIUM 40 MG/0.4ML ~~LOC~~ SOLN
40.0000 mg | Freq: Two times a day (BID) | SUBCUTANEOUS | Status: DC
  Administered 2016-12-18 – 2016-12-19 (×3): 40 mg via SUBCUTANEOUS
  Filled 2016-12-18 (×3): qty 0.4

## 2016-12-18 MED ORDER — OXYCODONE-ACETAMINOPHEN 5-325 MG PO TABS
1.0000 | ORAL_TABLET | Freq: Four times a day (QID) | ORAL | Status: DC | PRN
  Filled 2016-12-18: qty 1

## 2016-12-18 MED ORDER — INSULIN ASPART 100 UNIT/ML ~~LOC~~ SOLN
0.0000 [IU] | Freq: Three times a day (TID) | SUBCUTANEOUS | Status: DC
  Administered 2016-12-18: 1 [IU] via SUBCUTANEOUS
  Filled 2016-12-18: qty 1

## 2016-12-18 MED ORDER — METFORMIN HCL 500 MG PO TABS
500.0000 mg | ORAL_TABLET | Freq: Two times a day (BID) | ORAL | Status: DC
  Administered 2016-12-18 – 2016-12-19 (×4): 500 mg via ORAL
  Filled 2016-12-18 (×4): qty 1

## 2016-12-18 MED ORDER — RIFAXIMIN 550 MG PO TABS
550.0000 mg | ORAL_TABLET | Freq: Two times a day (BID) | ORAL | Status: DC
  Administered 2016-12-18 – 2016-12-19 (×4): 550 mg via ORAL
  Filled 2016-12-18 (×4): qty 1

## 2016-12-18 MED ORDER — OLANZAPINE 10 MG PO TABS
30.0000 mg | ORAL_TABLET | Freq: Every day | ORAL | Status: DC
  Administered 2016-12-18: 30 mg via ORAL
  Filled 2016-12-18 (×2): qty 3

## 2016-12-18 MED ORDER — SODIUM CHLORIDE 0.9% FLUSH: 3.0000 mL | INTRAVENOUS | Status: DC | PRN

## 2016-12-18 NOTE — Progress Notes (Signed)
Spoke to pt about using one of our Cpap's and he refused. RN aware.

## 2016-12-18 NOTE — Care Management (Signed)
Admitted with hepatic encephalopathy.  Patient lives at home with his brother and mother.  [facility].   Patient has chronic home O2, through [facility].  RW, and cane.  Mother provides transportation, and patients has an [employer] WC.  PCP [name].  Patient had referral made for trolioly to [facility] on 08/12/16.  Was notified after discharge that [facility] rep was never able to make contact with patient to arrange set up of device.  Per previous admission documentation home CPAP from [employer] was picked up from patient home due to non compliance.

## 2016-12-18 NOTE — Telephone Encounter (Signed)
pharmacy called pt needs a refill on trazodone and on zyprexa.   pt has appt in may.  rx was oked with one additional refill to get patient to the appt

## 2016-12-18 NOTE — Progress Notes (Signed)
PT Cancellation Note  Patient Details Name: Howard LinemanJoey A [name] MRN: [id number] DOB: 1981-02-05   Cancelled Treatment:    Reason Eval/Treat Not Completed: Fatigue/lethargy limiting ability to participate.  Pt lethargic and briefly waking up (with vc's and tactile cues) but pt unable to stay awake long enough to perform any meaningful physical therapy (nursing notified).  Will re-attempt PT eval at a later date/time.  Hendricks LimesEmily Caci Orren, PT 12/18/16, 2:45 PM [phone]

## 2016-12-18 NOTE — Progress Notes (Signed)
Anticoagulation monitoring(Lovenox):  35yo  M ordered Lovenox 40 mg Q24h  Filed Weights   12/17/16 1551  Weight: (!) 366 lb (166 kg)   BMI 45.8   Lab Results  Component Value Date   CREATININE 1.04 12/18/2016   CREATININE 0.89 12/17/2016   CREATININE 0.95 12/14/2016   Estimated Creatinine Clearance: 161.1 mL/min (by C-G formula based on SCr of 1.04 mg/dL). Hemoglobin & Hematocrit     Component Value Date/Time   HGB 9.5 (L) 12/18/2016 0519   HGB 14.4 12/25/2014 1404   HCT 30.4 (L) 12/18/2016 0519   HCT 44.7 12/25/2014 1404     Per Protocol for Patient with estCrcl > 30 ml/min and BMI > 40, will transition to Lovenox 40 mg Q12h.     Bari Mantis PharmD Clinical Pharmacist 12/18/2016

## 2016-12-18 NOTE — Progress Notes (Signed)
Pt currently alert and oriented following commands on 3L via nasal canula with O2 sats 94%.  Therefore, [facility].  Reinforced importance of compliance with using CPAP qhs pts mother at bedside and educated as well.  Sonda Rumbleana Matrice Herro, AGNP  Pulmonary/Critical Care Pager [phone] (please enter 7 digits) PCCM Consult Pager [phone] (please enter 7 digits)

## 2016-12-18 NOTE — Consult Note (Signed)
Name: Howard [name] MRN: [id number] DOB: [birth year]    ADMISSION DATE:  12/17/2016 CONSULTATION DATE:  12/17/2016  REFERRING MD : Dr. Amado Coe  CHIEF COMPLAINT: Dizziness   BRIEF PATIENT DESCRIPTION:  36 yo male presented to [facility] ER 03/28 with dizziness and altered mental status onset 03/27.  PCCM consulted for management of OSA and Obesity hypoventilation syndrome 03/29.   SIGNIFICANT EVENTS  03/28-Pt admitted to medsurg unit 03/29-PCCM consulted  STUDIES:  [place] Abd Limited RUQ 03/29>>No gallstones are noted within gallbladder. Normal CBD. No sonographic Murphy's sign. Again noted heterogeneous echogenicity of the liver could be nodular contour consistent with cirrhosis. No focal hepatic mass. Limited assessment of portal vein. Mainly hepatopetal flow with short instances of hepatofugal flow. Residual distal nonocclusive thrombus cannot be excluded.  HISTORY OF PRESENT ILLNESS:   This is a 36 yo male with a PMH of Seizures, Schizophrenia, Portal hypertensive gastropathy, Multiple thyroid nodules, Liver disease, HTN, Hyperlipidemia, Esophageal varices, Type II DM, Depression, COPD, OSA, Anemia, and Alcoholic cirrhosis of liver with ascites.  He presented to [facility] ER 03/28 with confusion and dizziness onset of symptoms 03/27.  He also complained of epigastric pain and problems with ambulating 1-2 days prior to presentation to the ER.  He was recently hospitalized and discharged 03/26 due to recurrent seizures.  In the ER 03/28 his ammonia level was markedly elevated at 89, therefore he was admitted by hospitalist services to the [facility] unit for further workup and treatment.  PCCM consulted 03/29 for additional management of OSA and Obesity hypoventilation syndrome.    PAST MEDICAL HISTORY :   has a past medical history of Alcoholic cirrhosis of liver with ascites (HCC); Anemia; COPD (chronic obstructive pulmonary disease) (HCC); Depression; Diabetes (HCC); Diabetes mellitus, type II (HCC);  Esophageal varices (HCC); Heart disease; Hyperlipemia; Hypertension; Liver disease; Multiple thyroid nodules; Portal hypertensive gastropathy; Schizophrenia (HCC); and Seizures (HCC).  has a past surgical history that includes No past surgeries; Esophagogastroduodenoscopy (egd) with propofol (N/A, 08/03/2015); Esophagogastroduodenoscopy (egd) with propofol (N/A, 08/31/2015); Esophagogastroduodenoscopy (N/A, 10/05/2015); Esophagogastroduodenoscopy (egd) with propofol (N/A, 04/04/2016); and Esophagogastroduodenoscopy (egd) with propofol (N/A, 11/13/2016). Prior to Admission medications   Medication Sig Start Date End Date Taking? Authorizing Provider  acamprosate (CAMPRAL) 333 MG tablet Take 2 tablets (666 mg total) by mouth 3 (three) times daily with meals. 08/19/16  Yes Audery Amel, MD  albuterol (PROVENTIL HFA;VENTOLIN HFA) 108 (90 Base) MCG/ACT inhaler Inhale 2 puffs into the lungs every 6 (six) hours as needed for wheezing or shortness of breath. 10/08/16  Yes Shaune Pollack, MD  citalopram (CELEXA) 20 MG tablet Take 1 tablet (20 mg total) by mouth every morning. 08/19/16  Yes Audery Amel, MD  lactulose (CHRONULAC) 10 GM/15ML solution Take 45 mLs (30 g total) by mouth every 4 (four) hours. Patient taking differently: Take 30 g by mouth 4 (four) times daily.  07/09/16  Yes Katharina Caper, MD  levETIRAcetam (KEPPRA) 750 MG tablet Take 2 tablets (1,500 mg total) by mouth 2 (two) times daily. 12/11/16 02/09/17 Yes Merrily Brittle, MD  losartan (COZAAR) 25 MG tablet Take 25 mg by mouth daily.   Yes Historical Provider, MD  metFORMIN (GLUCOPHAGE) 500 MG tablet Take 1 tablet (500 mg total) by mouth 2 (two) times daily with a meal. 05/15/16  Yes Alford Highland, MD  Multiple Vitamin (MULTIVITAMIN WITH MINERALS) TABS tablet Take 1 tablet by mouth daily. 08/04/15  Yes Ramonita Lab, MD  nadolol (CORGARD) 20 MG tablet Take 20 mg by mouth  daily.   Yes Historical Provider, MD  OLANZapine (ZYPREXA) 15 MG tablet Take 2  tablets (30 mg total) by mouth at bedtime. 05/22/16  Yes Audery AmelJohn T [name], MD  ondansetron (ZOFRAN) 4 MG tablet Take 1 tablet (4 mg total) by mouth every 8 (eight) hours as needed for nausea or vomiting. 08/26/16  Yes Governor Rooksebecca Lord, MD  rifaximin (XIFAXAN) 550 MG TABS tablet Take 1 tablet (550 mg total) by mouth 2 (two) times daily. 07/09/16  Yes Katharina Caperima Vaickute, MD  spironolactone (ALDACTONE) 50 MG tablet Take 1 tablet (50 mg total) by mouth 2 (two) times daily. 07/09/16  Yes Katharina Caperima Vaickute, MD  sucralfate (CARAFATE) 1 g tablet Take 1 g by mouth 4 (four) times daily.   Yes Historical Provider, MD  traZODone (DESYREL) 100 MG tablet Take 100 mg by mouth at bedtime as needed for sleep.    Yes Historical Provider, MD   No Known Allergies  FAMILY HISTORY:  family history includes Alcohol abuse in his father; Heart attack in his father; Heart disease in his brother, father, and mother; Hyperlipidemia in his mother; Hypertension in his father and mother; Stroke in his father. SOCIAL HISTORY:  reports that he has never smoked. He has never used smokeless tobacco. He reports that he drinks about 30.0 oz of alcohol per week . He reports that he does not use drugs.  REVIEW OF SYSTEMS:   Pt lethargic unable to assess  SUBJECTIVE:  Pt lethargic according to RN pt was awake this am, however he became increasingly lethargic throughout the day.  VITAL SIGNS: Temp:  [97.8 F (36.6 C)-99.1 F (37.3 C)] 97.8 F (36.6 C) (03/29 1216) Pulse Rate:  [73-94] 77 (03/29 1216) Resp:  [18-35] 20 (03/29 1216) BP: (107-136)/(59-77) 134/65 (03/29 1216) SpO2:  [86 %-100 %] 94 % (03/29 1216) Weight:  [166 kg (366 lb)] 166 kg (366 lb) (03/28 1551)  PHYSICAL EXAMINATION: General: acutely ill appearing obese Caucasian male  Neuro: lethargic, follows commands, PERRL  HEENT: supple, no JVD Cardiovascular: s1s2, rrr, no M/R/G Lungs: diminished throughout, shallow respirations, even, non labored  Abdomen: +BS x4, obese, soft,  non tender Musculoskeletal: normal bulk and tone Skin: intact no rashes or lesions   Recent Labs Lab 12/14/16 1157 12/17/16 1555 12/18/16 0519  NA 138 139 143  K 4.7 4.1 3.8  CL 100* 98* 101  CO2 35* 35* 36*  BUN 17 11 14   CREATININE 0.95 0.89 1.04  GLUCOSE 134* 110* 108*    Recent Labs Lab 12/15/16 0512 12/17/16 1555 12/18/16 0519  HGB 9.1* 9.9* 9.5*  HCT 29.5* 31.5* 30.4*  WBC 6.4 7.0 6.5  PLT 123* 146* 123*   Dg Chest 2 View  Result Date: 12/17/2016 CLINICAL DATA:  New onset dizziness and confusion. EXAM: CHEST  2 VIEW COMPARISON:  Chest x-ray 12/13/2016 FINDINGS: The cardiac silhouette, mediastinal and hilar contours are within normal limits and stable. There are bibasilar scarring changes but no infiltrates, edema or effusions. The bony thorax is intact. IMPRESSION: Bibasilar scarring changes.  No infiltrates or effusions. Electronically Signed   By: Rudie MeyerP.  Gallerani M.D.   On: 12/17/2016 20:22   [place] Abdomen Limited Ruq  Result Date: 12/18/2016 CLINICAL DATA:  Abdominal pain, ascites, portal vein thrombosis EXAM: US ABDOMEN LIMITED - RIGHT UPPER QUADRANT COMPARISON:  CT scan 10/15/2016 FINDINGS: Gallbladder: No gallstones are noted within gallbladder. No sonographic Murphy's sign. Gallbladder wall measures 2.9 mm thickness borderline by size criteria. Common bile duct: Diameter: 1.7 mm in diameter within  normal limits. Liver: There is heterogeneous echogenicity of the liver with nodular contour consistent with cirrhosis. Limited study by patient's large body habitus. No definite focal hepatic mass. Limited assessment and visualization of portal vein due to patient's large body habitus. Mainly hepatopetal flow with short instances of hepatofugal flow. IMPRESSION: No gallstones are noted within gallbladder. Normal CBD. No sonographic Murphy's sign. Again noted heterogeneous echogenicity of the liver could be nodular contour consistent with cirrhosis. No focal hepatic mass. Limited  assessment of portal vein. Mainly hepatopetal flow with short instances of hepatofugal flow. Residual distal nonocclusive thrombus cannot be excluded. Electronically Signed   By: Natasha Mead M.D.   On: 12/18/2016 09:38    ASSESSMENT / PLAN: Acute on Chronic hypoxic hypercapnic respiratory failure secondary to OSA and Obesity Hypoventilation Syndrome Acute Encephalopathy secondary to hypercapnia, hypoxia, and hepatic encephalopathy  Hx: ETOH abuse, Alcoholic cirrhosis of liver with ascites, COPD, noncompliant with CPAP machine, and Seizures P: CPAP qhs   Supplemental O2 to maintain O2 sats 88% to 92% or for dyspnea  Needs frequent reinforcement regarding importance of compliances with CPAP qhs  Continue prn bronchodilator therapy  Stat ABG Monitor ammonia level Continue Lactulose  GI consulted appreciate input Will consult dietitian when mentation improved to discuss diet Avoid sedating medication Frequent reorientation Lights on during the day  At discharge will need to follow-up with Pulmonology as outpatient he may need a repeat sleep study to requalify for CPAP machine   Sonda Rumble, AGNP  Pulmonary/Critical Care Pager [phone] (please enter 7 digits) PCCM Consult Pager [phone] (please enter 7 digits)   Billy Fischer, MD PCCM service Mobile [phone] Pager [phone] 12/19/2016

## 2016-12-18 NOTE — Consult Note (Addendum)
Howard Mood MD  [address] [place], [place] [address] Phone: [phone] Fax : [phone]  Consultation  Referring Provider:    Dr Amado Coe Primary Care Physician:  Howard Ivan, MD Primary Gastroenterologist:         Dr Markham Jordan Reason for Consultation:     Hepatic encephelopathy  Date of Admission:  12/17/2016 Date of Consultation:  12/18/2016         HPI:   Howard [name] is a 36 y.o. male with a history of alcoholic liver disease with ascites, has been on Xifaxan in the past , lactulose , known to have esophageal varices,PHG , h/o seizures . As per last GI note in 06/2016 with Dr Markham Jordan continued to consume alcohol.Admitted after 2 days of discharge for confusion , abdominal discomfort .    CBC suggests a microcutic anemia. Low chloride on BMP. Mild partial portal vein thrombosis seen on CT and[place] in 09/2016 .INR on admission 1.24 . PCO2 elevated on 12/17/16 . Ct head on 12/11/16 shows no acute abnormality.     Past Medical History:  Diagnosis Date  . Alcoholic cirrhosis of liver with ascites (HCC)   . Anemia   . COPD (chronic obstructive pulmonary disease) (HCC)   . Depression   . Diabetes (HCC)   . Diabetes mellitus, type II (HCC)   . Esophageal varices (HCC)   . Heart disease   . Hyperlipemia   . Hypertension   . Liver disease   . Multiple thyroid nodules   . Portal hypertensive gastropathy   . Schizophrenia (HCC)   . Seizures (HCC)     Past Surgical History:  Procedure Laterality Date  . ESOPHAGOGASTRODUODENOSCOPY N/A 10/05/2015   Procedure: ESOPHAGOGASTRODUODENOSCOPY (EGD);  Surgeon: Elnita Maxwell, MD;  Location: [facility] ENDOSCOPY;  Service: Endoscopy;  Laterality: N/A;  . ESOPHAGOGASTRODUODENOSCOPY (EGD) WITH PROPOFOL N/A 08/03/2015   Procedure: ESOPHAGOGASTRODUODENOSCOPY (EGD) WITH PROPOFOL;  Surgeon: Elnita Maxwell, MD;  Location: [facility] ENDOSCOPY;  Service: Endoscopy;  Laterality: N/A;  . ESOPHAGOGASTRODUODENOSCOPY (EGD) WITH PROPOFOL N/A 08/31/2015   Procedure: ESOPHAGOGASTRODUODENOSCOPY (EGD) WITH PROPOFOL;  Surgeon: Elnita Maxwell, MD;  Location: [facility] ENDOSCOPY;  Service: Endoscopy;  Laterality: N/A;  . ESOPHAGOGASTRODUODENOSCOPY (EGD) WITH PROPOFOL N/A 04/04/2016   Procedure: ESOPHAGOGASTRODUODENOSCOPY (EGD) WITH PROPOFOL;  Surgeon: Scot Jun, MD;  Location: [facility] ENDOSCOPY;  Service: Endoscopy;  Laterality: N/A;  . ESOPHAGOGASTRODUODENOSCOPY (EGD) WITH PROPOFOL N/A 11/13/2016   Procedure: ESOPHAGOGASTRODUODENOSCOPY (EGD) WITH PROPOFOL;  Surgeon: Howard Mood, MD;  Location: [facility] ENDOSCOPY;  Service: Endoscopy;  Laterality: N/A;  . NO PAST SURGERIES      Prior to Admission medications   Medication Sig Start Date End Date Taking? Authorizing Provider  acamprosate (CAMPRAL) 333 MG tablet Take 2 tablets (666 mg total) by mouth 3 (three) times daily with meals. 08/19/16  Yes Howard Amel, MD  albuterol (PROVENTIL HFA;VENTOLIN HFA) 108 (90 Base) MCG/ACT inhaler Inhale 2 puffs into the lungs every 6 (six) hours as needed for wheezing or shortness of breath. 10/08/16  Yes Shaune Pollack, MD  citalopram (CELEXA) 20 MG tablet Take 1 tablet (20 mg total) by mouth every morning. 08/19/16  Yes Howard Amel, MD  lactulose (CHRONULAC) 10 GM/15ML solution Take 45 mLs (30 g total) by mouth every 4 (four) hours. Patient taking differently: Take 30 g by mouth 4 (four) times daily.  07/09/16  Yes Katharina Caper, MD  levETIRAcetam (KEPPRA) 750 MG tablet Take 2 tablets (1,500 mg total) by mouth 2 (two) times daily. 12/11/16 02/09/17 Yes Lloyd Huger  Rifenbark, MD  losartan (COZAAR) 25 MG tablet Take 25 mg by mouth daily.   Yes Historical Provider, MD  metFORMIN (GLUCOPHAGE) 500 MG tablet Take 1 tablet (500 mg total) by mouth 2 (two) times daily with a meal. 05/15/16  Yes Alford Highland, MD  Multiple Vitamin (MULTIVITAMIN WITH MINERALS) TABS tablet Take 1 tablet by mouth daily. 08/04/15  Yes Ramonita Lab, MD  nadolol (CORGARD) 20 MG tablet Take 20 mg by mouth daily.    Yes Historical Provider, MD  OLANZapine (ZYPREXA) 15 MG tablet Take 2 tablets (30 mg total) by mouth at bedtime. 05/22/16  Yes Howard Amel, MD  ondansetron (ZOFRAN) 4 MG tablet Take 1 tablet (4 mg total) by mouth every 8 (eight) hours as needed for nausea or vomiting. 08/26/16  Yes Governor Rooks, MD  rifaximin (XIFAXAN) 550 MG TABS tablet Take 1 tablet (550 mg total) by mouth 2 (two) times daily. 07/09/16  Yes Katharina Caper, MD  spironolactone (ALDACTONE) 50 MG tablet Take 1 tablet (50 mg total) by mouth 2 (two) times daily. 07/09/16  Yes Katharina Caper, MD  sucralfate (CARAFATE) 1 g tablet Take 1 g by mouth 4 (four) times daily.   Yes Historical Provider, MD  traZODone (DESYREL) 100 MG tablet Take 100 mg by mouth at bedtime as needed for sleep.    Yes Historical Provider, MD    Family History  Problem Relation Age of Onset  . Heart disease Mother   . Hypertension Mother   . Hyperlipidemia Mother   . Stroke Father   . Heart attack Father   . Hypertension Father   . Heart disease Father   . Alcohol abuse Father   . Heart disease Brother      Social History  Substance Use Topics  . Smoking status: Never Smoker  . Smokeless tobacco: Never Used  . Alcohol use 30.0 oz/week    50 Cans of beer per week     Comment: last drink 9 days ago    Allergies as of 12/17/2016  . (No Known Allergies)    Review of Systems:    Unable to review systems as patient is drowsy   Physical Exam:  Vital signs in last 24 hours: Temp:  [97.8 F (36.6 C)-99.1 F (37.3 C)] 97.8 F (36.6 C) (03/29 1216) Pulse Rate:  [73-94] 77 (03/29 1216) Resp:  [18-35] 20 (03/29 1216) BP: (107-136)/(59-77) 134/65 (03/29 1216) SpO2:  [86 %-100 %] 94 % (03/29 1216) Weight:  [366 lb (166 kg)] 366 lb (166 kg) (03/28 1551)   General:   Very drowsy- unarousable : GCS: eyes opens to painful stimuli, moves to painful stimuli, no vocal response  Head:  Normocephalic and atraumatic. Eyes:   No icterus.   Conjunctiva pink.  PERRLA. Ears: unable to assesses  Neck:  Supple; no masses or thyroidomegaly Lungs: Respirations even and unlabored. Lungs clear to auscultation bilaterally.   No wheezes, crackles, or rhonchi.  Heart:  Regular rate and rhythm;  Without murmur, clicks, rubs or gallops Abdomen:  Soft, large distended , nontender. Normal bowel sounds. No appreciable masses or hepatomegaly.  No rebound or guarding.  Rectal:  Not performed. Msk: cannot be assessed  Extremities:no cyanosis or clubbing. Neurologic:  Alert and oriented xo   grossly normal neurologically. Skin:  Intact without significant lesions or rashes. Cervical Nodes:  No significant cervical adenopathy. Psych:  Unable to assess   LAB RESULTS:  Recent Labs  12/17/16 1555 12/18/16 0519  WBC 7.0 6.5  HGB  9.9* 9.5*  HCT 31.5* 30.4*  PLT 146* 123*   BMET  Recent Labs  12/17/16 1555 12/18/16 0519  NA 139 143  K 4.1 3.8  CL 98* 101  CO2 35* 36*  GLUCOSE 110* 108*  BUN 11 14  CREATININE 0.89 1.04  CALCIUM 9.4 9.2   LFT  Recent Labs  12/17/16 1555 12/18/16 0519  PROT 8.1 7.6  ALBUMIN 3.6 3.4*  AST 45* 35  ALT 36 31  ALKPHOS 98 91  BILITOT 0.7 0.7  BILIDIR 0.1  --   IBILI 0.6  --    PT/INR  Recent Labs  12/17/16 2045  LABPROT 15.7*  INR 1.24    STUDIES: Dg Chest 2 View  Result Date: 12/17/2016 CLINICAL DATA:  New onset dizziness and confusion. EXAM: CHEST  2 VIEW COMPARISON:  Chest x-ray 12/13/2016 FINDINGS: The cardiac silhouette, mediastinal and hilar contours are within normal limits and stable. There are bibasilar scarring changes but no infiltrates, edema or effusions. The bony thorax is intact. IMPRESSION: Bibasilar scarring changes.  No infiltrates or effusions. Electronically Signed   By: Rudie Meyer M.D.   On: 12/17/2016 20:22   US Abdomen Limited Ruq  Result Date: 12/18/2016 CLINICAL DATA:  Abdominal pain, ascites, portal vein thrombosis EXAM: US ABDOMEN LIMITED - RIGHT UPPER QUADRANT COMPARISON:   CT scan 10/15/2016 FINDINGS: Gallbladder: No gallstones are noted within gallbladder. No sonographic Murphy's sign. Gallbladder wall measures 2.9 mm thickness borderline by size criteria. Common bile duct: Diameter: 1.7 mm in diameter within normal limits. Liver: There is heterogeneous echogenicity of the liver with nodular contour consistent with cirrhosis. Limited study by patient's large body habitus. No definite focal hepatic mass. Limited assessment and visualization of portal vein due to patient's large body habitus. Mainly hepatopetal flow with short instances of hepatofugal flow. IMPRESSION: No gallstones are noted within gallbladder. Normal CBD. No sonographic Murphy's sign. Again noted heterogeneous echogenicity of the liver could be nodular contour consistent with cirrhosis. No focal hepatic mass. Limited assessment of portal vein. Mainly hepatopetal flow with short instances of hepatofugal flow. Residual distal nonocclusive thrombus cannot be excluded. Electronically Signed   By: Natasha Mead M.D.   On: 12/18/2016 09:38      Impression / Plan:   Howard [name] is a 36 y.o. y/o male with admitted with confusion which is likely hepatic encephelopathy. He suffers from chronic liver disease secondary to alcohol abuse with a history of ascites, esophageal varices. He follows with Dr Markham Jordan as an outpatient.    Presently - his decreased mental status may be multifactorial from hepatic encephelopath +/- hypercarbia from OSA/obseity hypoventilation syndrome +/- ?? Non epileptiform seizures  Plan  1. Check electrolytes including magnesium and correct  2. Limit narcotics 3. Lactulose titrate to 2-3 soft bowel movement a day , if having diarrhea decrease the dose as dehydration can worsen encephelopathy. No need to check ammonia levels. If he is confused then he is encephelopathic.  4. Xifaxan 550mg  BID 5. USG doppler of portal veins to evaluate for portal vein thrombosis, if ascites seen then will  need diagnostic paracentesis.  6. IV vitamins to prevent wernicke's encephelopathy  7. He should stop all alcohol consumption.  8. Pulmonary evaluation if ABG is abnormal- I note it has been ordered  9. If not able to take lactulose orally then will need to be given via NG tube 10. urine drug screen , check TSH 11. If continues to be unresponsive/persistently drowsy may need repeat CT head/MRI  to rule out subdural bleed/encephalitis  which happens not too uncommonly in alcoholics . EEG may also be indicated if does not respond to treatment.   Thank you for involving me in the care of this patient.      LOS: 0 days   Howard MoodKiran Kloi Brodman, MD  12/18/2016, 2:49 PM

## 2016-12-18 NOTE — Progress Notes (Addendum)
Pt was given 4 ounces of orange juice and breakfast tray is on the way. Will recheck B.S. MD was notified at 1025, no new orders given.

## 2016-12-18 NOTE — Progress Notes (Signed)
[facility] -  at [facility]   PATIENT NAME: Howard [name]    MR#:  [id number]  DATE OF BIRTH:  1980/11/09  SUBJECTIVE:  CHIEF COMPLAINT:  Patient is lethargic but arousable  REVIEW OF SYSTEMS:  CONSTITUTIONAL: No fever, fatigue or weakness.  EYES: No blurred or double vision.  EARS, NOSE, AND THROAT: No tinnitus or ear pain.  RESPIRATORY: No cough, shortness of breath, wheezing or hemoptysis.  CARDIOVASCULAR: No chest pain, orthopnea, edema.  GASTROINTESTINAL: No nausea, vomiting, diarrhea or abdominal pain.  GENITOURINARY: No dysuria, hematuria.  ENDOCRINE: No polyuria, nocturia,  HEMATOLOGY: No anemia, easy bruising or bleeding SKIN: No rash or lesion. MUSCULOSKELETAL: No joint pain or arthritis.   NEUROLOGIC: Lethargic but arousable and answers questions  PSYCHIATRY: No anxiety or depression.   DRUG ALLERGIES:  No Known Allergies  VITALS:  Blood pressure 134/65, pulse 77, temperature 97.8 F (36.6 C), temperature source Oral, resp. rate 20, height  (1.905 m), weight (!) 166 kg (366 lb), SpO2 94 %.  PHYSICAL EXAMINATION:  GENERAL:  36 y.o.-year-old patient lying in the bed with no acute distress.  EYES: Pupils equal, round, reactive to light and accommodation. No scleral icterus. Extraocular muscles intact.  HEENT: Head atraumatic, normocephalic. Oropharynx and nasopharynx clear.  NECK:  Supple, no jugular venous distention. No thyroid enlargement, no tenderness.  LUNGS: Normal breath sounds bilaterally, no wheezing, rales,rhonchi or crepitation. No use of accessory muscles of respiration.  CARDIOVASCULAR: S1, S2 normal. No murmurs, rubs, or gallops.  ABDOMEN: Soft, nontender, distended. Bowel sounds present. No organomegaly or mass.  EXTREMITIES: No pedal edema, cyanosis, or clubbing.  NEUROLOGIC: Lethargic but arousable and answers questions grossly intact  PSYCHIATRIC: The patient is alert and oriented x 2-3.  SKIN: No obvious rash,  lesion, or ulcer.    LABORATORY PANEL:   CBC  Recent Labs Lab 12/18/16 0519  WBC 6.5  HGB 9.5*  HCT 30.4*  PLT 123*   ------------------------------------------------------------------------------------------------------------------  Chemistries   Recent Labs Lab 12/18/16 0519  NA 143  K 3.8  CL 101  CO2 36*  GLUCOSE 108*  BUN 14  CREATININE 1.04  CALCIUM 9.2  AST 35  ALT 31  ALKPHOS 91  BILITOT 0.7   ------------------------------------------------------------------------------------------------------------------  Cardiac Enzymes  Recent Labs Lab 12/12/16 1938  TROPONINI <0.03   ------------------------------------------------------------------------------------------------------------------  RADIOLOGY:  Dg Chest 2 View  Result Date: 12/17/2016 CLINICAL DATA:  New onset dizziness and confusion. EXAM: CHEST  2 VIEW COMPARISON:  Chest x-ray 12/13/2016 FINDINGS: The cardiac silhouette, mediastinal and hilar contours are within normal limits and stable. There are bibasilar scarring changes but no infiltrates, edema or effusions. The bony thorax is intact. IMPRESSION: Bibasilar scarring changes.  No infiltrates or effusions. Electronically Signed   By: Rudie Meyer M.D.   On: 12/17/2016 20:22   US Abdomen Limited Ruq  Result Date: 12/18/2016 CLINICAL DATA:  Abdominal pain, ascites, portal vein thrombosis EXAM: US ABDOMEN LIMITED - RIGHT UPPER QUADRANT COMPARISON:  CT scan 10/15/2016 FINDINGS: Gallbladder: No gallstones are noted within gallbladder. No sonographic Murphy's sign. Gallbladder wall measures 2.9 mm thickness borderline by size criteria. Common bile duct: Diameter: 1.7 mm in diameter within normal limits. Liver: There is heterogeneous echogenicity of the liver with nodular contour consistent with cirrhosis. Limited study by patient's large body habitus. No definite focal hepatic mass. Limited assessment and visualization of portal vein due to patient's  large body habitus. Mainly hepatopetal flow with short instances of hepatofugal flow. IMPRESSION: No gallstones  are noted within gallbladder. Normal CBD. No sonographic Murphy's sign. Again noted heterogeneous echogenicity of the liver could be nodular contour consistent with cirrhosis. No focal hepatic mass. Limited assessment of portal vein. Mainly hepatopetal flow with short instances of hepatofugal flow. Residual distal nonocclusive thrombus cannot be excluded. Electronically Signed   By: Natasha Mead M.D.   On: 12/18/2016 09:38    EKG:   Orders placed or performed during the hospital encounter of 12/17/16  . EKG 12-Lead  . EKG 12-Lead  . ED EKG  . ED EKG    ASSESSMENT AND PLAN:    #1. Hepatic encephalopathy GI consult placed as patient is coming with recurrent admissions Increase lactulose continue his medications, patient claims taking his medications according to prescription, follow clinically Abdominal ultrasound with chronic findings  #2. Generalized weakness, the physical therapist involved recommendations, could be related to hypoxia on exertion, follow O2 sats on exertion   #3. Diffuse abdominal pain, etiology is unclear, continue Carafate, get ultrasound of abdomen to rule out ascites, worsening portal venous thrombosis, patient's temperature is normal,  urinalysis was unremarkable GI consulted.    #4. Hypoxia. very likely due to severe sleep apnea, obesity hypoventilation syndrome  continue oxygen, chest x-ray showed chronic changes Patient is noncompliant with the CPAP machine in the past CPAP daily at bedtime Pulmonology consult     All the records are reviewed and case discussed with Care Management/Social Workerr. Management plans discussed with the patient, family and they are in agreement.  CODE STATUS: fc   TOTAL TIME TAKING CARE OF THIS PATIENT: 36 minutes.   POSSIBLE D/C IN 2 DAYS, DEPENDING ON CLINICAL CONDITION.  Note: This dictation was prepared  with Dragon dictation along with smaller phrase technology. Any transcriptional errors that result from this process are unintentional.   Ramonita Lab M.D on 12/18/2016 at 2:19 PM  Between 7am to 6pm - Pager - [phone] After 6pm go to [email or web address] - password [email or web address] [facility]  [facility]  Office  [phone]  CC: Primary care physician; Marisue Ivan, MD

## 2016-12-19 ENCOUNTER — Observation Stay: Payer: Medicare Other

## 2016-12-19 DIAGNOSIS — E662 Morbid (severe) obesity with alveolar hypoventilation: Secondary | ICD-10-CM | POA: Diagnosis not present

## 2016-12-19 DIAGNOSIS — J9612 Chronic respiratory failure with hypercapnia: Secondary | ICD-10-CM | POA: Diagnosis not present

## 2016-12-19 DIAGNOSIS — G4733 Obstructive sleep apnea (adult) (pediatric): Secondary | ICD-10-CM | POA: Diagnosis not present

## 2016-12-19 DIAGNOSIS — J9611 Chronic respiratory failure with hypoxia: Secondary | ICD-10-CM

## 2016-12-19 DIAGNOSIS — K7290 Hepatic failure, unspecified without coma: Secondary | ICD-10-CM | POA: Diagnosis not present

## 2016-12-19 LAB — CBC
HCT: 30.5 % — ABNORMAL LOW (ref 40.0–52.0)
Hemoglobin: 9.6 g/dL — ABNORMAL LOW (ref 13.0–18.0)
MCH: 25.4 pg — AB (ref 26.0–34.0)
MCHC: 31.5 g/dL — ABNORMAL LOW (ref 32.0–36.0)
MCV: 80.8 fL (ref 80.0–100.0)
PLATELETS: 119 10*3/uL — AB (ref 150–440)
RBC: 3.77 MIL/uL — AB (ref 4.40–5.90)
RDW: 21.3 % — AB (ref 11.5–14.5)
WBC: 5.4 10*3/uL (ref 3.8–10.6)

## 2016-12-19 LAB — MAGNESIUM: Magnesium: 1.6 mg/dL — ABNORMAL LOW (ref 1.7–2.4)

## 2016-12-19 LAB — GLUCOSE, CAPILLARY
GLUCOSE-CAPILLARY: 95 mg/dL (ref 65–99)
GLUCOSE-CAPILLARY: 80 mg/dL (ref 65–99)
GLUCOSE-CAPILLARY: 94 mg/dL (ref 65–99)

## 2016-12-19 LAB — AMMONIA: Ammonia: 68 umol/L — ABNORMAL HIGH (ref 9–35)

## 2016-12-19 MED ORDER — FOLIC ACID 1 MG PO TABS
1.0000 mg | ORAL_TABLET | Freq: Every day | ORAL | Status: DC
  Administered 2016-12-19: 1 mg via ORAL
  Filled 2016-12-19: qty 1

## 2016-12-19 MED ORDER — THIAMINE HCL 100 MG PO TABS: 100.0000 mg | ORAL_TABLET | Freq: Every day | ORAL | Status: DC

## 2016-12-19 MED ORDER — FOLIC ACID 1 MG PO TABS: 1.0000 mg | ORAL_TABLET | Freq: Every day | ORAL | 0 refills | Status: DC

## 2016-12-19 MED ORDER — VITAMIN B-1 100 MG PO TABS
100.0000 mg | ORAL_TABLET | Freq: Every day | ORAL | Status: DC
  Administered 2016-12-19: 100 mg via ORAL
  Filled 2016-12-19: qty 1

## 2016-12-19 MED ORDER — ADULT MULTIVITAMIN W/MINERALS CH
1.0000 | ORAL_TABLET | Freq: Every day | ORAL | Status: DC
  Administered 2016-12-19: 1 via ORAL
  Filled 2016-12-19: qty 1

## 2016-12-19 NOTE — Progress Notes (Signed)
No distress No new complaints  Vitals:   12/19/16 0425 12/19/16 0614 12/19/16 0740 12/19/16 1013  BP:    (!) 105/59  Pulse:  63  70  Resp:      Temp:      TempSrc:      SpO2: 92% (!) 89% 91% 92%  Weight:      Height:       NAD Cognition intact No JVD noted Chest clear Reg, no M Obese, NABS Trace symmetric edema No focal neuro deficits  BMP Latest Ref Rng & Units 12/18/2016 12/18/2016 12/17/2016  Glucose 65 - 99 mg/dL 161(W) 960(A) 540(J)  BUN 6 - 20 mg/dL Creatinine 0.61 - 1.24 mg/dL 8.11 9.14 7.82  Sodium 135 - 145 mmol/L 139 143 139  Potassium 3.5 - 5.1 mmol/L 4.3 3.8 4.1  Chloride 101 - 111 mmol/L 98(L) 101 98(L)  CO2 22 - 32 mmol/L 36(H) 36(H) 35(H)  Calcium 8.9 - 10.3 mg/dL 9.3 9.2 9.4   CBC Latest Ref Rng & Units 12/19/2016 12/18/2016 12/17/2016  WBC 3.8 - 10.6 K/uL 5.4 6.5 7.0  Hemoglobin 13.0 - 18.0 g/dL 9.5(A) 2.1(H) 0.8(M)  Hematocrit 40.0 - 52.0 % 30.5(L) 30.4(L) 31.5(L)  Platelets 150 - 440 K/uL 119(L) 123(L) 146(L)   No new CXR  IMPRESSION: Obesity hypoventilation syndrome with chronic hypercarbic/hypoxic respiratory failure Probable OSA  PLAN/REC: OK for discharge from PCCM perspective Continue chronic O2 supplementation Recommend nocturnal BiPAP @ 12/8 (for now) after discharge  Hopefully he can qualify on the basis of chronic hypercarbic failure A sleep study should be performed  He had one ordered in November but apparently was not done He has seen Dr Nicholos Johns in the office in the past and should follow up with him  I have placed request and my office will contact him next week  PCCM will sign off. Please call if we can be of further assistance  Billy Fischer, MD PCCM service Mobile [phone] Pager [phone] 12/19/2016

## 2016-12-19 NOTE — Care Management (Signed)
Patient discharged home today with resumption of home health care orders.  Cheryl from [place] notified of discharge plan.  Referral made to [facility] for Triology.  Per Barbara Cower if patient is approved it would be Monday before Triology is delivered. MD aware.  Patient declined to bring home O2 tank for discharge.

## 2016-12-19 NOTE — Progress Notes (Signed)
IV and tele were removed. Discharge instructions, follow-up appointments, and prescriptions were provided to the pt and mother at bedside. Pt instructed to use oxygen at night and to be compliant with all medications. The pt was taken downstairs via wheelchair by NT.

## 2016-12-19 NOTE — Evaluation (Signed)
Physical Therapy Evaluation Patient Details Name: Howard [name] MRN: [id number] DOB: Aug 19, 1981 Today's Date: 12/19/2016   History of Present Illness  Pt is a 36 y.o. male presenting to hospital with dizziness, worsening confusion, and abdominal pain x1-2 days.  Pt recently discharged 12/15/16 with persistent seizures and hepatic encephalopathy.  Pt now admitted to hospital with hepatic encephalopathy, generalized weakness, diffuse abdominal pain, hypoxia, acute on chronic hypoxic hypercapnic respiratory failure secondary to OSA and obesity hypoventilation syndrome.  PMH includes alcoholic cirrhosis of liver, anemia, COPD, depression, DM, htn, schizophrenia, and esophageal varices.  Clinical Impression  Prior to hospital admission, pt was ambulating with cane.  Pt lives with his mother and brother in 1 level home with stairs to enter.  Currently pt is CGA with bed mobility, transfers, and ambulation with RW around nursing loop (extra staff assist for safety and equipment management); pt steady with ambulation using RW.  O2 91% or greater during session on 3 L O2 via nasal cannula.  Pt would benefit from skilled PT to address noted impairments and functional limitations.  Recommend pt discharge to home with HHPT and support of family when medically appropriate.    Follow Up Recommendations Home health PT;Supervision for mobility/OOB    Equipment Recommendations  Rolling walker with 5" wheels (Pt already has RW at home)    Recommendations for Other Services       Precautions / Restrictions Precautions Precautions: Fall Restrictions Weight Bearing Restrictions: No      Mobility  Bed Mobility Overal bed mobility: Needs Assistance Bed Mobility: Supine to Sit     Supine to sit: Min guard;HOB elevated     General bed mobility comments: increased effort to perform  Transfers Overall transfer level: Needs assistance Equipment used: Rolling walker (2 wheeled) Transfers: Sit to/from  Stand Sit to Stand: Min guard;+2 safety/equipment         General transfer comment: CGA x2 for safety; fairly strong stand without loss of balance  Ambulation/Gait Ambulation/Gait assistance: Min guard;+2 safety/equipment (3rd assist for chair follow for safety) Ambulation Distance (Feet): 180 Feet Assistive device:  (Bari RW) Gait Pattern/deviations: Step-through pattern Gait velocity: mildly decreased   General Gait Details: +2 CGA for safety and O2 tank management; 3rd person for close chair follow; steady with use of RW  Stairs            Wheelchair Mobility    Modified Rankin (Stroke Patients Only)       Balance Overall balance assessment: Needs assistance Sitting-balance support: No upper extremity supported;Feet supported Sitting balance-Leahy Scale: Good     Standing balance support: Bilateral upper extremity supported (on RW) Standing balance-Leahy Scale: Fair Standing balance comment: static standing                             Pertinent Vitals/Pain Pain Assessment: No/denies pain  HR WFL during session.    Home Living Family/patient expects to be discharged to:: Private residence Living Arrangements: Parent;Other relatives (Pt lives with his mom and brother.) Available Help at Discharge: Available 24 hours/day;Family (Mom is available 24 hrs/day) Type of Home: House Home Access: Stairs to enter Entrance Stairs-Rails:  (B rails; can only reach one at a time.) Entrance Stairs-Number of Steps: 3 Home Layout: One level Home Equipment: Walker - 2 wheels;Cane - quad;Shower seat      Prior Function Level of Independence: Independent with assistive device(s)         Comments: Pt  mod I with ambulation using a quad cane most of the time, only uses the walker sometimes; pt reports 7-8 falls using the quad cane in the past six months secondary to dizziness, Pt states he feels like "the world comes out from under him."     Hand Dominance         Extremity/Trunk Assessment   Upper Extremity Assessment Upper Extremity Assessment: Generalized weakness    Lower Extremity Assessment Lower Extremity Assessment: Generalized weakness       Communication   Communication: No difficulties  Cognition Arousal/Alertness: Awake/alert Behavior During Therapy: WFL for tasks assessed/performed Overall Cognitive Status: Within Functional Limits for tasks assessed                                        General Comments General comments (skin integrity, edema, etc.): Pt's mother present during session.  Nursing cleared pt for participation in physical therapy.  Pt agreeable to PT session.    Exercises  Ambulation   Assessment/Plan    PT Assessment Patient needs continued PT services  PT Problem List Decreased strength;Decreased balance       PT Treatment Interventions DME instruction;Gait training;Stair training;Functional mobility training;Therapeutic activities;Therapeutic exercise;Balance training;Patient/family education    PT Goals (Current goals can be found in the Care Plan section)  Acute Rehab PT Goals Patient Stated Goal: to walk PT Goal Formulation: With patient Time For Goal Achievement: 01/02/17 Potential to Achieve Goals: Good    Frequency Min 2X/week   Barriers to discharge        Co-evaluation               End of Session Equipment Utilized During Treatment: Gait belt;Oxygen Activity Tolerance: Patient tolerated treatment well Patient left: in chair;with call bell/phone within reach;with chair alarm set;with family/visitor present Nurse Communication: Mobility status;Precautions PT Visit Diagnosis: Muscle weakness (generalized) (M62.81);History of falling (Z91.81);Difficulty in walking, not elsewhere classified (R26.2)    Time: 1208-1225 PT Time Calculation (min) (ACUTE ONLY): 17 min   Charges:   PT Evaluation $PT Eval Low Complexity: 1 Procedure PT  Treatments $Therapeutic Exercise: 8-22 mins   PT G Codes:   PT G-Codes **NOT FOR INPATIENT CLASS** Functional Assessment Tool Used: AM-PAC 6 Clicks Basic Mobility Functional Limitation: Mobility: Walking and moving around Mobility: Walking and Moving Around Current Status (Z6109): At least 40 percent but less than 60 percent impaired, limited or restricted Mobility: Walking and Moving Around Goal Status [phone]): At least 1 percent but less than 20 percent impaired, limited or restricted    Hendricks Limes, PT 12/19/16, 1:21 PM [phone]

## 2016-12-19 NOTE — Discharge Summary (Signed)
[facility] [place] 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 Alcoholic cirrhosis of liver with ascites (HCC)   . Anemia   . COPD (chronic obstructive pulmonary disease) (HCC)   . Depression   . Diabetes (HCC)   . Diabetes mellitus, type II (HCC)   . Esophageal varices (HCC)   . Heart disease   . Hyperlipemia   . Hypertension   . Liver disease   . Multiple thyroid nodules   . Portal hypertensive gastropathy   . Schizophrenia (HCC)   . Seizures [facility])     HOSPITAL COURSE:   HISTORY OF PRESENT ILLNESS: Howard [name]  is a 36 y.o. male with a known history of Alcoholic liver cirrhosis, anemia, COPD, diabetes, esophageal varices, hyperlipidemia, obesity, obstructive sleep apnea, who presents to the hospital with complaints of feeling confused, dizzy since yesterday. Apparently patient was discharged from the hospital 2 days ago for recurrent seizure, he was postictal. He was discharged in good condition. He was noted to be more to be more confused and dizzy by his mother, he was also complaining of some abdominal pain for the past 1-2 days, pain was described as achiness, intermittent with no alleviating or aggravating factors in all abdomen, mostly in the epigastric area. Patient denies any fevers or chills, however, admits of fatigue and weakness, not able to even walk  at home,  per his mother. On arrival to the hospital. He was noted to have markedly elevated ammonia level and recommendations were made to admit.    # Hypoxia. very likely due to severe sleep apnea, obesity hypoventilation syndrome continue oxygen, chest x-ray showed chronic changes Patient is noncompliant with the CPAP machine in the past I had a discussion with the patient regarding the need for noninvasive ventilation done on use of this equipment for 24 hour time period may result in significant injury, rehospitalization and that and patient has had multiple hospitalizations over the past year directly related to the same condition, patient verbalized understanding hoping to get approval of noninvasive ventilator on Monday CPAP/noninvasive ventilation daily at bedtime, Pulmonology consult,op f/u   # Hepatic encephalopathy Resolved  Reinforced the importance of being compliant with the medications  Follow-up with gastroenterology as an outpatient  Gastroenterology is not recommending to check ammonia levels patient needs 2-3 soft bowel movements per day  Titrate lactulose as needed, xifaxan continue his medications, patient claims taking his medications according to prescription, follow clinically Abdominal ultrasound with chronic findings  # Generalized weakness, the physical therapist involved recommendations, could be related to hypoxia on exertion, follow O2 sats on exertion   #. Diffuse abdominal pain, etiology is unclear, continue Carafate,   ultrasound of abdomen Limited portal vein is assessment no ascites patient's temperature is normal, urinalysis was unremarkable Outpatient GI follow-up  #Urine screen positive for benzos Counseled patient to stop using illicit drugs   DISCHARGE CONDITIONS:    stable  CONSULTS OBTAINED:  Treatment Team:  Wyline Mood, MD   PROCEDURES  None   DRUG ALLERGIES:  No Known Allergies  DISCHARGE MEDICATIONS:   Current Discharge  Medication List    START taking these medications   Details  folic acid (FOLVITE) 1 MG tablet Take 1 tablet (1 mg total) by mouth daily. Qty: 30 tablet, Refills: 0    thiamine 100 MG tablet Take 1 tablet (100 mg total) by mouth daily.      CONTINUE these medications which have NOT CHANGED   Details  acamprosate (CAMPRAL) 333 MG tablet Take 2 tablets (666 mg total) by mouth 3 (three) times daily with meals. Qty: 180 tablet, Refills: 5    albuterol (PROVENTIL HFA;VENTOLIN HFA) 108 (90 Base) MCG/ACT inhaler Inhale 2 puffs into the lungs every 6 (six) hours as needed for wheezing or shortness of breath. Qty: 1 Inhaler, Refills: 2    citalopram (CELEXA) 20 MG tablet Take 1 tablet (20 mg total) by mouth every morning. Qty: 30 tablet, Refills: 5    lactulose (CHRONULAC) 10 GM/15ML solution Take 45 mLs (30 g total) by mouth every 4 (four) hours. Qty: 240 mL, Refills: 0    levETIRAcetam (KEPPRA) 750 MG tablet Take 2 tablets (1,500 mg total) by mouth 2 (two) times daily. Qty: 120 tablet, Refills: 0    losartan (COZAAR) 25 MG tablet Take 25 mg by mouth daily.    metFORMIN (GLUCOPHAGE) 500 MG tablet Take 1 tablet (500 mg total) by mouth 2 (two) times daily with a meal. Qty: 60 tablet, Refills: 0    Multiple Vitamin (MULTIVITAMIN WITH MINERALS) TABS tablet Take 1 tablet by mouth daily. Qty: 30 tablet, Refills: 0    nadolol (CORGARD) 20 MG tablet Take 20 mg by mouth daily.    OLANZapine (ZYPREXA) 15 MG tablet Take 2 tablets (30 mg total) by mouth at bedtime. Qty: 60 tablet, Refills: 5    ondansetron (ZOFRAN) 4 MG tablet Take 1 tablet (4 mg total) by mouth every 8 (eight) hours as needed for nausea or vomiting. Qty: 10 tablet, Refills: 0    rifaximin (XIFAXAN) 550 MG TABS tablet Take 1 tablet (550 mg total) by mouth 2 (two) times daily. Qty: 60 tablet, Refills: 5    spironolactone (ALDACTONE) 50 MG tablet Take 1 tablet (50 mg total) by mouth 2 (two) times daily. Qty: 60 tablet,  Refills: 5    sucralfate (CARAFATE) 1 g tablet Take 1 g by mouth 4 (four) times daily.    traZODone (DESYREL) 100 MG tablet Take 100 mg by mouth at bedtime as needed for sleep.          DISCHARGE INSTRUCTIONS:   Follow-up with primary care physician in a week Follow-up with gastroenterology in a week Follow-up with pulmonology in a week  continue non invasive ventilation daily at bedtime   DIET:  Cardiac diet, diabetic  DISCHARGE CONDITION:  Stable  ACTIVITY:  Activity as tolerated  OXYGEN:  Home Oxygen: Yes.     Oxygen Delivery: 3 liters/min via Patient connected to nasal cannula oxygen  DISCHARGE LOCATION:  home   If you experience worsening of your admission symptoms, develop shortness of breath, life threatening emergency, suicidal or homicidal thoughts you must seek medical attention immediately by calling 911 or calling your MD immediately  if symptoms less severe.  You Must read complete instructions/literature along with all the possible adverse reactions/side effects for all the Medicines you take and that have been prescribed to you. Take any new Medicines after  you have completely understood and accpet all the possible adverse reactions/side effects.   Please note  You were cared for by a hospitalist during your hospital stay. If you have any questions about your discharge medications or the care you received while you were in the hospital after you are discharged, you can call the unit and asked to speak with the hospitalist on call if the hospitalist that took care of you is not available. Once you are discharged, your primary care physician will handle any further medical issues. Please note that NO REFILLS for any discharge medications will be authorized once you are discharged, as it is imperative that you return to your primary care physician (or establish a relationship with a primary care physician if you do not have one) for your aftercare needs so that  they can reassess your need for medications and monitor your lab values.     Today  Chief Complaint  Patient presents with  . Dizziness   Patient is more awake and alert. Ambulated in the hallway with PT and wants to go home otherwise threatening sign out AMA,  ROS:  CONSTITUTIONAL: Denies fevers, chills. Denies any fatigue, weakness.  EYES: Denies blurry vision, double vision, eye pain. EARS, NOSE, THROAT: Denies tinnitus, ear pain, hearing loss. RESPIRATORY: Denies cough, wheeze, shortness of breath.  CARDIOVASCULAR: Denies chest pain, palpitations, edema.  GASTROINTESTINAL: Denies nausea, vomiting, diarrhea, abdominal pain. Denies bright red blood per rectum. GENITOURINARY: Denies dysuria, hematuria. ENDOCRINE: Denies nocturia or thyroid problems. HEMATOLOGIC AND LYMPHATIC: Denies easy bruising or bleeding. SKIN: Denies rash or lesion. MUSCULOSKELETAL: Denies pain in neck, back, shoulder, knees, hips or arthritic symptoms.  NEUROLOGIC: Denies paralysis, paresthesias.  PSYCHIATRIC: Denies anxiety or depressive symptoms.   VITAL SIGNS:  Blood pressure (!) 143/74, pulse 80, temperature 98.1 F (36.7 C), temperature source Oral, resp. rate 18, height  (1.905 m), weight (!) 166 kg (366 lb), SpO2 96 %.  I/O:    Intake/Output Summary (Last 24 hours) at 12/19/16 1659 Last data filed at 12/19/16 1500  Gross per 24 hour  Intake              600 ml  Output                0 ml  Net              600 ml    PHYSICAL EXAMINATION:  GENERAL:  36 y.o.-year-old patient lying in the bed with no acute distress.  EYES: Pupils equal, round, reactive to light and accommodation. No scleral icterus. Extraocular muscles intact.  HEENT: Head atraumatic, normocephalic. Oropharynx and nasopharynx clear.  NECK:  Supple, no jugular venous distention. No thyroid enlargement, no tenderness.  LUNGS: Normal breath sounds bilaterally, no wheezing, rales,rhonchi or crepitation. No use of accessory  muscles of respiration.  CARDIOVASCULAR: S1, S2 normal. No murmurs, rubs, or gallops.  ABDOMEN: Soft, non-tender, non-distended. Bowel sounds present. No organomegaly or mass.  EXTREMITIES: No pedal edema, cyanosis, or clubbing.  NEUROLOGIC: Cranial nerves II through XII are intact. Muscle strength 5/5 in all extremities. Sensation intact. Gait not checked.  PSYCHIATRIC: The patient is alert and oriented x 3.  SKIN: No obvious rash, lesion, or ulcer.   DATA REVIEW:   CBC  Recent Labs Lab 12/19/16 0621  WBC 5.4  HGB 9.6*  HCT 30.5*  PLT 119*    Chemistries   Recent Labs Lab 12/18/16 1449 12/19/16 1545  NA 139  --   K 4.3  --  CL 98*  --   CO2 36*  --   GLUCOSE 113*  --   BUN 14  --   CREATININE 0.96  --   CALCIUM 9.3  --   MG  --  1.6*  AST 41  --   ALT 34  --   ALKPHOS 94  --   BILITOT 0.8  --     Cardiac Enzymes  Recent Labs Lab 12/12/16 1938  TROPONINI <0.03    Microbiology Results  Results for orders placed or performed during the hospital encounter of 10/04/16  MRSA PCR Screening     Status: None   Collection Time: 10/05/16 11:00 PM  Result Value Ref Range Status   MRSA by PCR NEGATIVE NEGATIVE Final    Comment:        The GeneXpert MRSA Assay (FDA approved for NASAL specimens only), is one component of a comprehensive MRSA colonization surveillance program. It is not intended to diagnose MRSA infection nor to guide or monitor treatment for MRSA infections.     RADIOLOGY:  Dg Chest 2 View  Result Date: 12/17/2016 CLINICAL DATA:  New onset dizziness and confusion. EXAM: CHEST  2 VIEW COMPARISON:  Chest x-ray 12/13/2016 FINDINGS: The cardiac silhouette, mediastinal and hilar contours are within normal limits and stable. There are bibasilar scarring changes but no infiltrates, edema or effusions. The bony thorax is intact. IMPRESSION: Bibasilar scarring changes.  No infiltrates or effusions. Electronically Signed   By: Rudie Meyer M.D.   On:  12/17/2016 20:22   [place] Art/ven Flow Abd Pelv Doppler  Result Date: 12/19/2016 CLINICAL DATA:  Liver failure. EXAM: DUPLEX ULTRASOUND OF LIVER TECHNIQUE: Color and duplex Doppler ultrasound was performed to evaluate the hepatic in-flow and out-flow vessels. However, exam is limited due to body habitus. COMPARISON:  Ultrasound of December 18, 2016. FINDINGS: Portal Vein Velocities Main:  30.6 cm/sec Right:  28.2 cm/sec Left:  19.1 cm/sec Normal hepatopetal flow is noted in the main and left pulmonary veins, although hepatofugal flow is noted in the right portal vein. Hepatic Vein Velocities Right:  15.3 cm/sec Middle:  22.9 cm/sec Left:  20.9 cm/sec Normal hepatofugal flow is noted in the hepatic veins. Hepatic Artery Velocity:  211.6 cm/sec Splenic Vein Velocity:  25.8 cm/sec Varices: Absent. Ascites: Absent. Spleen measures 18.7 x 17.7 x 5.4 cm, with calculated volume of 932 cubic cm. This is consistent with moderate splenomegaly. IMPRESSION: Limited exam due to body habitus. There is no evidence of hepatic or splenic venous thrombosis or occlusion. No definite thrombus is noted in the visualized portal veins, although hepatofugal flow is noted in the right portal vein, which would suggest portal hypertension from hepatic cirrhosis. Thrombus cannot be excluded. Moderate splenomegaly is noted. Electronically Signed   By: Lupita Raider, M.D.   On: 12/19/2016 09:55   US Abdomen Limited Ruq  Addendum Date: 12/18/2016   ADDENDUM REPORT: 12/18/2016 15:41 ADDENDUM: No abdominal ascites was noted. Electronically Signed   By: Natasha Mead M.D.   On: 12/18/2016 15:41   Result Date: 12/18/2016 CLINICAL DATA:  Abdominal pain, ascites, portal vein thrombosis EXAM: US ABDOMEN LIMITED - RIGHT UPPER QUADRANT COMPARISON:  CT scan 10/15/2016 FINDINGS: Gallbladder: No gallstones are noted within gallbladder. No sonographic Murphy's sign. Gallbladder wall measures 2.9 mm thickness borderline by size criteria. Common bile duct:  Diameter: 1.7 mm in diameter within normal limits. Liver: There is heterogeneous echogenicity of the liver with nodular contour consistent with cirrhosis. Limited study by patient's large  body habitus. No definite focal hepatic mass. Limited assessment and visualization of portal vein due to patient's large body habitus. Mainly hepatopetal flow with short instances of hepatofugal flow. IMPRESSION: No gallstones are noted within gallbladder. Normal CBD. No sonographic Murphy's sign. Again noted heterogeneous echogenicity of the liver could be nodular contour consistent with cirrhosis. No focal hepatic mass. Limited assessment of portal vein. Mainly hepatopetal flow with short instances of hepatofugal flow. Residual distal nonocclusive thrombus cannot be excluded. Electronically Signed: By: Natasha Mead M.D. On: 12/18/2016 09:38    EKG:   Orders placed or performed during the hospital encounter of 12/17/16  . EKG 12-Lead  . EKG 12-Lead  . ED EKG  . ED EKG      Management plans discussed with the patient, family and they are in agreement.  CODE STATUS:     Code Status Orders        Start     Ordered   12/18/16 0026  Full code  Continuous     12/18/16 0025    Code Status History    Date Active Date Inactive Code Status Order ID Comments User Context   12/12/2016 11:40 PM 12/15/2016  8:28 PM Full Code [id number]  Arnaldo Natal, MD Inpatient   11/24/2016  1:11 AM 11/25/2016  4:42 PM Full Code [id number]  Tonye Royalty, DO Inpatient   11/12/2016  3:36 PM 11/14/2016  2:36 PM Full Code [id number]  Auburn Bilberry, MD Inpatient   10/04/2016  6:04 PM 10/08/2016  3:44 PM Full Code [id number]  Shaune Pollack, MD Inpatient   09/22/2016  2:29 AM 09/23/2016  6:38 PM Full Code [id number]  Oralia Manis, MD Inpatient   08/07/2016  7:37 PM 08/12/2016  8:51 PM Full Code [id number]  Gracelyn Nurse, MD Inpatient   07/07/2016  1:40 AM 07/09/2016  6:29 PM Full Code [id number]  Ihor Austin, MD Inpatient   06/10/2016  6:21 AM  06/11/2016  8:25 PM Full Code [id number]  Ihor Austin, MD Inpatient   05/12/2016  3:55 AM 05/15/2016  2:57 PM Full Code [id number]  Ihor Austin, MD Inpatient   04/28/2016  2:54 AM 04/29/2016  6:23 PM Full Code [id number]  Alexis Hugelmeyer, DO Inpatient   03/14/2016 12:30 PM 03/16/2016  3:41 PM Full Code [id number]  Shaune Pollack, MD Inpatient   03/03/2016 12:13 PM 03/06/2016  2:26 PM Full Code [id number]  Milagros Loll, MD ED   02/10/2016  5:54 PM 02/12/2016  7:34 PM Full Code [id number]  Ramonita Lab, MD Inpatient   11/11/2015  5:16 AM 11/13/2015  6:00 PM Full Code [id number]  Ihor Austin, MD Inpatient   11/07/2015 10:02 PM 11/09/2015  4:56 PM Full Code [id number]  Enid Baas, MD Inpatient   10/28/2015  8:14 PM 10/29/2015  2:26 PM Full Code [id number]  Altamese Dilling, MD Inpatient   10/16/2015 11:47 PM 10/17/2015  6:00 PM Full Code [id number]  Oralia Manis, MD Inpatient   07/30/2015  5:35 PM 08/04/2015  8:34 PM Full Code [id number]  Enedina Finner, MD Inpatient   07/05/2015  1:16 AM 07/08/2015  7:22 PM Full Code [id number]  Oralia Manis, MD Inpatient   05/30/2015  7:28 PM 06/01/2015  3:36 PM Full Code [id number]  Audery Amel, MD Inpatient      TOTAL TIME TAKING CARE OF THIS PATIENT: 45 minutes.   Note: This dictation was prepared with Dragon dictation along with smaller phrase technology. Any transcriptional errors that result from this process are unintentional.   @  on 12/19/2016 at 4:59 PM  Between 7am to 6pm - Pager - [phone]  After 6pm go to [email or web address] - password [email or web address] [facility]  [place] [facility]  Office  [phone]  CC: Primary care physician; Marisue Ivan, MD

## 2016-12-19 NOTE — Discharge Instructions (Signed)
Follow-up with primary care physician in a week Follow-up with gastroenterology in a week Follow-up with pulmonology in a week  continue non invasive ventilation daily at bedtime

## 2016-12-21 ENCOUNTER — Encounter: Payer: Self-pay | Admitting: Emergency Medicine

## 2016-12-21 ENCOUNTER — Emergency Department
Admission: EM | Admit: 2016-12-21 | Discharge: 2016-12-21 | Disposition: A | Discharge status: Home / Self Care | Payer: Medicare Other | Attending: Emergency Medicine | Admitting: Emergency Medicine

## 2016-12-21 DIAGNOSIS — J449 Chronic obstructive pulmonary disease, unspecified: Secondary | ICD-10-CM | POA: Diagnosis not present

## 2016-12-21 DIAGNOSIS — Z79899 Other long term (current) drug therapy: Secondary | ICD-10-CM | POA: Diagnosis not present

## 2016-12-21 DIAGNOSIS — Z7984 Long term (current) use of oral hypoglycemic drugs: Secondary | ICD-10-CM | POA: Diagnosis not present

## 2016-12-21 DIAGNOSIS — G40909 Epilepsy, unspecified, not intractable, without status epilepticus: Secondary | ICD-10-CM | POA: Diagnosis present

## 2016-12-21 DIAGNOSIS — E119 Type 2 diabetes mellitus without complications: Secondary | ICD-10-CM | POA: Insufficient documentation

## 2016-12-21 DIAGNOSIS — R569 Unspecified convulsions: Secondary | ICD-10-CM

## 2016-12-21 DIAGNOSIS — I10 Essential (primary) hypertension: Secondary | ICD-10-CM | POA: Diagnosis not present

## 2016-12-21 LAB — BASIC METABOLIC PANEL
Anion gap: 6 (ref 5–15)
BUN: 12 mg/dL (ref 6–20)
CALCIUM: 9.2 mg/dL (ref 8.9–10.3)
CHLORIDE: 99 mmol/L — AB (ref 101–111)
CO2: 34 mmol/L — ABNORMAL HIGH (ref 22–32)
CREATININE: 0.91 mg/dL (ref 0.61–1.24)
GFR calc non Af Amer: >60 mL/min (ref >60)
Glucose, Bld: 152 mg/dL — ABNORMAL HIGH (ref 65–99)
Potassium: 4.3 mmol/L (ref 3.5–5.1)
SODIUM: 139 mmol/L (ref 135–145)

## 2016-12-21 LAB — CBC
HCT: 32.6 % — ABNORMAL LOW (ref 40.0–52.0)
Hemoglobin: 10.1 g/dL — ABNORMAL LOW (ref 13.0–18.0)
MCH: 24.9 pg — AB (ref 26.0–34.0)
MCHC: 31 g/dL — ABNORMAL LOW (ref 32.0–36.0)
MCV: 80.6 fL (ref 80.0–100.0)
PLATELETS: 127 10*3/uL — AB (ref 150–440)
RBC: 4.05 MIL/uL — AB (ref 4.40–5.90)
RDW: 21.8 % — AB (ref 11.5–14.5)
WBC: 4.7 10*3/uL (ref 3.8–10.6)

## 2016-12-21 LAB — AMMONIA: AMMONIA: 105 umol/L — AB (ref 9–35)

## 2016-12-21 LAB — ETHANOL: Alcohol, Ethyl (B): <5 mg/dL (ref <5)

## 2016-12-21 MED ORDER — LEVETIRACETAM 500 MG PO TABS
ORAL_TABLET | ORAL | Status: AC
  Filled 2016-12-21: qty 3

## 2016-12-21 MED ORDER — LEVETIRACETAM 500 MG PO TABS
1500.0000 mg | ORAL_TABLET | Freq: Once | ORAL | Status: AC
  Administered 2016-12-21: 1500 mg via ORAL

## 2016-12-21 NOTE — ED Triage Notes (Addendum)
Pt reports seizure today, at least 1 minute. Pt reports he's out of his medications until Tuesday, refused admission recently. Pt is on 2L chronic, pt report he feels confused. Drank a quart of alcohol yesterday.

## 2016-12-21 NOTE — Discharge Instructions (Signed)
Please be sure to take your seizure medication everyday without missing any doses!

## 2016-12-21 NOTE — ED Provider Notes (Signed)
[facility] Emergency Department Provider Note   ____________________________________________    I have reviewed the triage vital signs and the nursing notes.   HISTORY  Chief Complaint Seizures     HPI Howard [name] is a 36 y.o. male with multiple medical problems as listed below who presents after a seizure. Patient reports a seizure this morning at approximately 8 AM, his mother witnessed this. She reports he ran out of his Keppra so they decided to come to the emergency department because pharmacies are not open today. He is not having any confusion. Mother reports he is acting normally although somewhat sleepy. No fevers or chills reported. Recent hospitalization for elevated ammonia. He does have his lactulose at home and reports compliance   Past Medical History:  Diagnosis Date  . Alcoholic cirrhosis of liver with ascites (HCC)   . Anemia   . COPD (chronic obstructive pulmonary disease) (HCC)   . Depression   . Diabetes (HCC)   . Diabetes mellitus, type II (HCC)   . Esophageal varices (HCC)   . Heart disease   . Hyperlipemia   . Hypertension   . Liver disease   . Multiple thyroid nodules   . Portal hypertensive gastropathy   . Schizophrenia (HCC)   . Seizures [facility])     Patient Active Problem List   Diagnosis Date Noted  . Diffuse abdominal pain 12/17/2016  . Respiratory failure with hypoxia (HCC) 12/14/2016  . Epilepsy (HCC) 12/12/2016  . GIB (gastrointestinal bleeding) 10/05/2016  . Ascites 10/04/2016  . DNR (do not resuscitate) discussion 08/11/2016  . Palliative care by specialist 08/11/2016  . Muscle weakness (generalized)   . Acute upper GI bleed   . GI bleed 08/07/2016  . Alcoholic cirrhosis of liver (HCC) 07/09/2016  . Acute respiratory failure with hypercapnia (HCC) 07/09/2016  . Hyperglycemia 07/09/2016  . Seizure (HCC) 06/10/2016  . Altered mental status 05/12/2016  . Headache 04/29/2016  . OSA (obstructive sleep  apnea) 04/29/2016  . Anemia 04/29/2016  . Thrombocytopenia (HCC) 04/29/2016  . Coagulopathy (HCC) 04/29/2016  . Obesity 04/29/2016  . Generalized weakness 04/29/2016  . Acute hepatic encephalopathy 03/14/2016  . Controlled type 2 diabetes mellitus without complication (HCC) 01/15/2016  . Normocytic anemia 12/12/2015  . Essential (primary) hypertension 12/11/2015  . Pure hypercholesterolemia 12/11/2015  . Fever 11/11/2015  . Ascites due to alcoholic cirrhosis (HCC) 11/11/2015  . Cirrhosis of liver with ascites (HCC) 11/11/2015  . Hepatic encephalopathy (HCC) 10/16/2015  . Type 2 diabetes mellitus (HCC) 10/16/2015  . Acute renal failure (ARF) (HCC) 07/30/2015  . 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 Alcoholic cirrhosis (HCC) 07/19/2015  . Hypoxia 07/04/2015  . Elevated transaminase level 07/04/2015  . DOE (dyspnea on exertion) 07/04/2015  . Alcohol abuse 05/31/2015  . Hypertension 05/29/2015  . Paranoid schizophrenia (HCC) 03/20/2015    Past Surgical History:  Procedure Laterality Date  . ESOPHAGOGASTRODUODENOSCOPY N/A 10/05/2015   Procedure: ESOPHAGOGASTRODUODENOSCOPY (EGD);  Surgeon: Elnita Maxwell, MD;  Location: [place] ENDOSCOPY;  Service: Endoscopy;  Laterality: N/A;  . ESOPHAGOGASTRODUODENOSCOPY (EGD) WITH PROPOFOL N/A 08/03/2015   Procedure: ESOPHAGOGASTRODUODENOSCOPY (EGD) WITH PROPOFOL;  Surgeon: Elnita Maxwell, MD;  Location: [facility] ENDOSCOPY;  Service: Endoscopy;  Laterality: N/A;  . ESOPHAGOGASTRODUODENOSCOPY (EGD) WITH PROPOFOL N/A 08/31/2015   Procedure: ESOPHAGOGASTRODUODENOSCOPY (EGD) WITH PROPOFOL;  Surgeon: Elnita Maxwell, MD;  Location: [facility] ENDOSCOPY;  Service: Endoscopy;  Laterality: N/A;  . ESOPHAGOGASTRODUODENOSCOPY (EGD) WITH PROPOFOL N/A 04/04/2016   Procedure:  ESOPHAGOGASTRODUODENOSCOPY (EGD) WITH PROPOFOL;  Surgeon: Scot Jun, MD;  Location: [facility] ENDOSCOPY;  Service: Endoscopy;  Laterality: N/A;  .  ESOPHAGOGASTRODUODENOSCOPY (EGD) WITH PROPOFOL N/A 11/13/2016   Procedure: ESOPHAGOGASTRODUODENOSCOPY (EGD) WITH PROPOFOL;  Surgeon: Wyline Mood, MD;  Location: [facility] ENDOSCOPY;  Service: Endoscopy;  Laterality: N/A;  . NO PAST SURGERIES      Prior to Admission medications   Medication Sig Start Date End Date Taking? Authorizing Provider  acamprosate (CAMPRAL) 333 MG tablet Take 2 tablets (666 mg total) by mouth 3 (three) times daily with meals. 08/19/16   Audery Amel, MD  albuterol (PROVENTIL HFA;VENTOLIN HFA) 108 (90 Base) MCG/ACT inhaler Inhale 2 puffs into the lungs every 6 (six) hours as needed for wheezing or shortness of breath. 10/08/16   Shaune Pollack, MD  citalopram (CELEXA) 20 MG tablet Take 1 tablet (20 mg total) by mouth every morning. 08/19/16   Audery Amel, MD  folic acid (FOLVITE) 1 MG tablet Take 1 tablet (1 mg total) by mouth daily. 12/19/16   Ramonita Lab, MD  lactulose (CHRONULAC) 10 GM/15ML solution Take 45 mLs (30 g total) by mouth every 4 (four) hours. Patient taking differently: Take 30 g by mouth 4 (four) times daily.  07/09/16   Katharina Caper, MD  levETIRAcetam (KEPPRA) 750 MG tablet Take 2 tablets (1,500 mg total) by mouth 2 (two) times daily. 12/11/16 02/09/17  Merrily Brittle, MD  losartan (COZAAR) 25 MG tablet Take 25 mg by mouth daily.    Historical Provider, MD  metFORMIN (GLUCOPHAGE) 500 MG tablet Take 1 tablet (500 mg total) by mouth 2 (two) times daily with a meal. 05/15/16   Alford Highland, MD  Multiple Vitamin (MULTIVITAMIN WITH MINERALS) TABS tablet Take 1 tablet by mouth daily. 08/04/15   Ramonita Lab, MD  nadolol (CORGARD) 20 MG tablet Take 20 mg by mouth daily.    Historical Provider, MD  OLANZapine (ZYPREXA) 15 MG tablet Take 2 tablets (30 mg total) by mouth at bedtime. 05/22/16   Audery Amel, MD  ondansetron (ZOFRAN) 4 MG tablet Take 1 tablet (4 mg total) by mouth every 8 (eight) hours as needed for nausea or vomiting. 08/26/16   Governor Rooks, MD  rifaximin  (XIFAXAN) 550 MG TABS tablet Take 1 tablet (550 mg total) by mouth 2 (two) times daily. 07/09/16   Katharina Caper, MD  spironolactone (ALDACTONE) 50 MG tablet Take 1 tablet (50 mg total) by mouth 2 (two) times daily. 07/09/16   Katharina Caper, MD  sucralfate (CARAFATE) 1 g tablet Take 1 g by mouth 4 (four) times daily.    Historical Provider, MD  thiamine 100 MG tablet Take 1 tablet (100 mg total) by mouth daily. 12/19/16   Ramonita Lab, MD  traZODone (DESYREL) 100 MG tablet Take 100 mg by mouth at bedtime as needed for sleep.     Historical Provider, MD     Allergies Patient has no known allergies.  Family History  Problem Relation Age of Onset  . Heart disease Mother   . Hypertension Mother   . Hyperlipidemia Mother   . Stroke Father   . Heart attack Father   . Hypertension Father   . Heart disease Father   . Alcohol abuse Father   . Heart disease Brother     Social History Social History  Substance Use Topics  . Smoking status: Never Smoker  . Smokeless tobacco: Never Used  . Alcohol use 30.0 oz/week    50 Cans of beer per  week     Comment: last drink 9 days ago    Review of Systems  Constitutional: No fever/chills  Cardiovascular: Denies chest pain. Respiratory: Denies shortness of breath. Gastrointestinal: No abdominal pain.  No nausea, no vomiting.    Musculoskeletal: Negative for back pain. Skin: Negative for rash. Neurological: Negative For confusion or altered mental status  10-point ROS otherwise negative.  ____________________________________________   PHYSICAL EXAM:  VITAL SIGNS: ED Triage Vitals  Enc Vitals Group     BP 12/21/16 1832 130/69     Pulse Rate 12/21/16 1832 99     Resp 12/21/16 1832 20     Temp 12/21/16 1832 98.3 F (36.8 C)     Temp Source 12/21/16 1832 Oral     SpO2 12/21/16 1832 92 %     Weight 12/21/16 1808 (!) 366 lb (166 kg)     Height 12/21/16 1808  (1.905 m)     Head Circumference --      Peak Flow --      Pain Score  --      Pain Loc --      Pain Edu? --      Excl. in GC? --     Constitutional: Alert and oriented4, no acute distress Eyes: Conjunctivae are normal.   Nose: No congestion/rhinnorhea. Mouth/Throat: Mucous membranes are moist.    Cardiovascular: Normal rate, regular rhythm. Grossly normal heart sounds.  Good peripheral circulation. Respiratory: Normal respiratory effort.  No retractions. Lungs CTAB. Gastrointestinal: Soft and nontender. No distention.  No CVA tenderness. Genitourinary: deferred Musculoskeletal: No lower extremity tenderness nor edema.  Warm and well perfused Neurologic:  Normal speech and language. No gross focal neurologic deficits are appreciated.  Skin:  Skin is warm, dry and intact. No rash noted. Psychiatric: Mood and affect are normal. Speech and behavior are normal.  ____________________________________________   LABS (all labs ordered are listed, but only abnormal results are displayed)  Labs Reviewed  AMMONIA - Abnormal; Notable for the following:       Result Value   Ammonia 105 (*)    All other components within normal limits  BASIC METABOLIC PANEL - Abnormal; Notable for the following:    Chloride 99 (*)    CO2 34 (*)    Glucose, Bld 152 (*)    All other components within normal limits  CBC - Abnormal; Notable for the following:    RBC 4.05 (*)    Hemoglobin 10.1 (*)    HCT 32.6 (*)    MCH 24.9 (*)    MCHC 31.0 (*)    RDW 21.8 (*)    Platelets 127 (*)    All other components within normal limits  ETHANOL   ____________________________________________  EKG  None ____________________________________________  RADIOLOGY  None ____________________________________________   PROCEDURES  Procedure(s) performed: No    Critical Care performed: No ____________________________________________   INITIAL IMPRESSION / ASSESSMENT AND PLAN / ED COURSE  Pertinent labs & imaging results that were available during my care of the patient  were reviewed by me and considered in my medical decision making (see chart for details).  Patient well-appearing and in no acute distress, he is watching U-tube videos on his phone. He has not answered any altered mental status or confusion. Seizure likely related to medication noncompliance, they are here essentially for medication refill. They state they can fill his medications in the morning. We will give a dose of his Keppra, 1500 mg now and he will refill in  the morning. Ammonia level sent by nurse in triage, however patient's symptoms are  notconsistent with hepatic encephalopathy, no benefit to admission at this time. Patient has not had any seizure activity in over 12 hours. Patient and mother are quite comfortable with discharge, they know they can return at any time     ____________________________________________   FINAL CLINICAL IMPRESSION(S) / ED DIAGNOSES  Final diagnoses:  Seizure (HCC)      NEW MEDICATIONS STARTED DURING THIS VISIT:  New Prescriptions   No medications on file     Note:  This document was prepared using Dragon voice recognition software and may include unintentional dictation errors.    Jene Every, MD 12/21/16 2107

## 2016-12-21 NOTE — ED Notes (Signed)
Pt upset that he didn't get his breathing mask by primary care. He stated he needed a CPAP machine, however someone was supposed to come by tomorrow if he qualified.

## 2016-12-22 NOTE — Telephone Encounter (Signed)
thanks

## 2016-12-29 ENCOUNTER — Emergency Department: Payer: Medicare Other

## 2016-12-29 ENCOUNTER — Inpatient Hospital Stay
Admission: EM | Admit: 2016-12-29 | Discharge: 2017-01-01 | DRG: 441 | Disposition: A | Discharge status: Home Health | Payer: Medicare Other | Attending: Internal Medicine | Admitting: Internal Medicine

## 2016-12-29 ENCOUNTER — Encounter: Payer: Self-pay | Admitting: Emergency Medicine

## 2016-12-29 DIAGNOSIS — K7682 Hepatic encephalopathy: Secondary | ICD-10-CM | POA: Diagnosis present

## 2016-12-29 DIAGNOSIS — Z9119 Patient's noncompliance with other medical treatment and regimen: Secondary | ICD-10-CM

## 2016-12-29 DIAGNOSIS — K7290 Hepatic failure, unspecified without coma: Secondary | ICD-10-CM | POA: Diagnosis not present

## 2016-12-29 DIAGNOSIS — Z7984 Long term (current) use of oral hypoglycemic drugs: Secondary | ICD-10-CM

## 2016-12-29 DIAGNOSIS — K766 Portal hypertension: Secondary | ICD-10-CM | POA: Diagnosis present

## 2016-12-29 DIAGNOSIS — E119 Type 2 diabetes mellitus without complications: Secondary | ICD-10-CM

## 2016-12-29 DIAGNOSIS — I10 Essential (primary) hypertension: Secondary | ICD-10-CM | POA: Diagnosis present

## 2016-12-29 DIAGNOSIS — T473X6A Underdosing of saline and osmotic laxatives, initial encounter: Secondary | ICD-10-CM | POA: Diagnosis present

## 2016-12-29 DIAGNOSIS — K7030 Alcoholic cirrhosis of liver without ascites: Secondary | ICD-10-CM | POA: Diagnosis present

## 2016-12-29 DIAGNOSIS — F1010 Alcohol abuse, uncomplicated: Secondary | ICD-10-CM | POA: Diagnosis present

## 2016-12-29 DIAGNOSIS — F200 Paranoid schizophrenia: Secondary | ICD-10-CM | POA: Diagnosis present

## 2016-12-29 DIAGNOSIS — Z8249 Family history of ischemic heart disease and other diseases of the circulatory system: Secondary | ICD-10-CM

## 2016-12-29 DIAGNOSIS — E8779 Other fluid overload: Secondary | ICD-10-CM | POA: Diagnosis present

## 2016-12-29 DIAGNOSIS — Y92009 Unspecified place in unspecified non-institutional (private) residence as the place of occurrence of the external cause: Secondary | ICD-10-CM

## 2016-12-29 DIAGNOSIS — D638 Anemia in other chronic diseases classified elsewhere: Secondary | ICD-10-CM | POA: Diagnosis present

## 2016-12-29 DIAGNOSIS — J449 Chronic obstructive pulmonary disease, unspecified: Secondary | ICD-10-CM | POA: Diagnosis present

## 2016-12-29 DIAGNOSIS — Z9114 Patient's other noncompliance with medication regimen: Secondary | ICD-10-CM

## 2016-12-29 DIAGNOSIS — F1020 Alcohol dependence, uncomplicated: Secondary | ICD-10-CM | POA: Diagnosis present

## 2016-12-29 DIAGNOSIS — E785 Hyperlipidemia, unspecified: Secondary | ICD-10-CM | POA: Diagnosis present

## 2016-12-29 DIAGNOSIS — Z811 Family history of alcohol abuse and dependence: Secondary | ICD-10-CM

## 2016-12-29 DIAGNOSIS — Z6841 Body Mass Index (BMI) 40.0 and over, adult: Secondary | ICD-10-CM

## 2016-12-29 DIAGNOSIS — J9601 Acute respiratory failure with hypoxia: Secondary | ICD-10-CM | POA: Diagnosis present

## 2016-12-29 DIAGNOSIS — E662 Morbid (severe) obesity with alveolar hypoventilation: Secondary | ICD-10-CM | POA: Diagnosis present

## 2016-12-29 LAB — CBC
HCT: 32.2 % — ABNORMAL LOW (ref 40.0–52.0)
HEMOGLOBIN: 10 g/dL — AB (ref 13.0–18.0)
MCH: 24.9 pg — ABNORMAL LOW (ref 26.0–34.0)
MCHC: 30.9 g/dL — AB (ref 32.0–36.0)
MCV: 80.7 fL (ref 80.0–100.0)
PLATELETS: 170 10*3/uL (ref 150–440)
RBC: 3.99 MIL/uL — AB (ref 4.40–5.90)
RDW: 20.8 % — ABNORMAL HIGH (ref 11.5–14.5)
WBC: 7 10*3/uL (ref 3.8–10.6)

## 2016-12-29 LAB — HEPATIC FUNCTION PANEL
ALT: 28 U/L (ref 17–63)
AST: 39 U/L (ref 15–41)
Albumin: 3.6 g/dL (ref 3.5–5.0)
Alkaline Phosphatase: 107 U/L (ref 38–126)
Bilirubin, Direct: <0.1 mg/dL — ABNORMAL LOW (ref 0.1–0.5)
TOTAL PROTEIN: 8.4 g/dL — AB (ref 6.5–8.1)
Total Bilirubin: 0.8 mg/dL (ref 0.3–1.2)

## 2016-12-29 LAB — BASIC METABOLIC PANEL
ANION GAP: 7 (ref 5–15)
BUN: 10 mg/dL (ref 6–20)
CALCIUM: 9.3 mg/dL (ref 8.9–10.3)
CO2: 32 mmol/L (ref 22–32)
CREATININE: 0.99 mg/dL (ref 0.61–1.24)
Chloride: 97 mmol/L — ABNORMAL LOW (ref 101–111)
GLUCOSE: 248 mg/dL — AB (ref 65–99)
Potassium: 4.6 mmol/L (ref 3.5–5.1)
Sodium: 136 mmol/L (ref 135–145)

## 2016-12-29 LAB — PROTIME-INR
INR: 1.18
Prothrombin Time: 15.1 seconds (ref 11.4–15.2)

## 2016-12-29 LAB — AMMONIA: AMMONIA: 89 umol/L — AB (ref 9–35)

## 2016-12-29 MED ORDER — LACTULOSE 10 GM/15ML PO SOLN
30.0000 g | Freq: Once | ORAL | Status: AC
Start: 2016-12-29 — End: 2016-12-29
  Administered 2016-12-29: 30 g via ORAL
  Filled 2016-12-29: qty 60

## 2016-12-29 NOTE — ED Provider Notes (Signed)
[facility] Emergency Department Provider Note    First MD Initiated Contact with Patient 12/29/16 2050     (approximate)  I have reviewed the triage vital signs and the nursing notes.   HISTORY  Chief Complaint Weakness    HPI Howard [name] is a 36 y.o. male [facility] facility presents with altered mental status and weakness with generalized shaking spell. States that the weakness has been worsening over the past week. He did drink a sixpack of beer. Has not been taking his lactulose as prescribed. States "I keep forgetting ". Mother is here with him states that he does not like to take it because it causes him to have increased bowel movements. No fevers at home. No shortness of breath and excess of his baseline COPD. Denies any nausea or vomiting.   Past Medical History:  Diagnosis Date  . Alcoholic cirrhosis of liver with ascites (HCC)   . Anemia   . COPD (chronic obstructive pulmonary disease) (HCC)   . Depression   . Diabetes (HCC)   . Diabetes mellitus, type II (HCC)   . Esophageal varices (HCC)   . Heart disease   . Hyperlipemia   . Hypertension   . Liver disease   . Multiple thyroid nodules   . Portal hypertensive gastropathy   . Schizophrenia (HCC)   . Seizures (HCC)    Family History  Problem Relation Age of Onset  . Heart disease Mother   . Hypertension Mother   . Hyperlipidemia Mother   . Stroke Father   . Heart attack Father   . Hypertension Father   . Heart disease Father   . Alcohol abuse Father   . Heart disease Brother    Past Surgical History:  Procedure Laterality Date  . ESOPHAGOGASTRODUODENOSCOPY N/A 10/05/2015   Procedure: ESOPHAGOGASTRODUODENOSCOPY (EGD);  Surgeon: Elnita Maxwell, MD;  Location: [facility] ENDOSCOPY;  Service: Endoscopy;  Laterality: N/A;  . ESOPHAGOGASTRODUODENOSCOPY (EGD) WITH PROPOFOL N/A 08/03/2015   Procedure: ESOPHAGOGASTRODUODENOSCOPY (EGD) WITH PROPOFOL;  Surgeon: Elnita Maxwell, MD;   Location: [facility] ENDOSCOPY;  Service: Endoscopy;  Laterality: N/A;  . ESOPHAGOGASTRODUODENOSCOPY (EGD) WITH PROPOFOL N/A 08/31/2015   Procedure: ESOPHAGOGASTRODUODENOSCOPY (EGD) WITH PROPOFOL;  Surgeon: Elnita Maxwell, MD;  Location: [facility] ENDOSCOPY;  Service: Endoscopy;  Laterality: N/A;  . ESOPHAGOGASTRODUODENOSCOPY (EGD) WITH PROPOFOL N/A 04/04/2016   Procedure: ESOPHAGOGASTRODUODENOSCOPY (EGD) WITH PROPOFOL;  Surgeon: Scot Jun, MD;  Location: [facility] ENDOSCOPY;  Service: Endoscopy;  Laterality: N/A;  . ESOPHAGOGASTRODUODENOSCOPY (EGD) WITH PROPOFOL N/A 11/13/2016   Procedure: ESOPHAGOGASTRODUODENOSCOPY (EGD) WITH PROPOFOL;  Surgeon: Wyline Mood, MD;  Location: [facility] ENDOSCOPY;  Service: Endoscopy;  Laterality: N/A;  . NO PAST SURGERIES     Patient Active Problem List   Diagnosis Date Noted  . Diffuse abdominal pain 12/17/2016  . Respiratory failure with hypoxia (HCC) 12/14/2016  . Epilepsy (HCC) 12/12/2016  . GIB (gastrointestinal bleeding) 10/05/2016  . Ascites 10/04/2016  . DNR (do not resuscitate) discussion 08/11/2016  . Palliative care by specialist 08/11/2016  . Muscle weakness (generalized)   . Acute upper GI bleed   . GI bleed 08/07/2016  . Alcoholic cirrhosis of liver (HCC) 07/09/2016  . Acute respiratory failure with hypercapnia (HCC) 07/09/2016  . Hyperglycemia 07/09/2016  . Seizure (HCC) 06/10/2016  . Altered mental status 05/12/2016  . Headache 04/29/2016  . OSA (obstructive sleep apnea) 04/29/2016  . Anemia 04/29/2016  . Thrombocytopenia (HCC) 04/29/2016  . Coagulopathy (HCC) 04/29/2016  . Obesity 04/29/2016  . Generalized  weakness 04/29/2016  . Acute hepatic encephalopathy 03/14/2016  . Controlled type 2 diabetes mellitus without complication (HCC) 01/15/2016  . Normocytic anemia 12/12/2015  . Essential (primary) hypertension 12/11/2015  . Pure hypercholesterolemia 12/11/2015  . Fever 11/11/2015  . Ascites due to alcoholic cirrhosis (HCC) 11/11/2015  .  Cirrhosis of liver with ascites (HCC) 11/11/2015  . Hepatic encephalopathy (HCC) 10/16/2015  . Type 2 diabetes mellitus (HCC) 10/16/2015  . Acute renal failure (ARF) (HCC) 07/30/2015  . 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 Alcoholic cirrhosis (HCC) 07/19/2015  . Hypoxia 07/04/2015  . Elevated transaminase level 07/04/2015  . DOE (dyspnea on exertion) 07/04/2015  . Alcohol abuse 05/31/2015  . Hypertension 05/29/2015  . Paranoid schizophrenia (HCC) 03/20/2015      Prior to Admission medications   Medication Sig Start Date End Date Taking? Authorizing Provider  acamprosate (CAMPRAL) 333 MG tablet Take 2 tablets (666 mg total) by mouth 3 (three) times daily with meals. 08/19/16   Audery Amel, MD  albuterol (PROVENTIL HFA;VENTOLIN HFA) 108 (90 Base) MCG/ACT inhaler Inhale 2 puffs into the lungs every 6 (six) hours as needed for wheezing or shortness of breath. 10/08/16   Shaune Pollack, MD  citalopram (CELEXA) 20 MG tablet Take 1 tablet (20 mg total) by mouth every morning. 08/19/16   Audery Amel, MD  folic acid (FOLVITE) 1 MG tablet Take 1 tablet (1 mg total) by mouth daily. 12/19/16   Ramonita Lab, MD  lactulose (CHRONULAC) 10 GM/15ML solution Take 45 mLs (30 g total) by mouth every 4 (four) hours. Patient taking differently: Take 30 g by mouth 4 (four) times daily.  07/09/16   Katharina Caper, MD  levETIRAcetam (KEPPRA) 750 MG tablet Take 2 tablets (1,500 mg total) by mouth 2 (two) times daily. 12/11/16 02/09/17  Merrily Brittle, MD  losartan (COZAAR) 25 MG tablet Take 25 mg by mouth daily.    Historical Provider, MD  metFORMIN (GLUCOPHAGE) 500 MG tablet Take 1 tablet (500 mg total) by mouth 2 (two) times daily with a meal. 05/15/16   Alford Highland, MD  Multiple Vitamin (MULTIVITAMIN WITH MINERALS) TABS tablet Take 1 tablet by mouth daily. 08/04/15   Ramonita Lab, MD  nadolol (CORGARD) 20 MG tablet Take 20 mg by mouth daily.    Historical Provider, MD  OLANZapine  (ZYPREXA) 15 MG tablet Take 2 tablets (30 mg total) by mouth at bedtime. 05/22/16   Audery Amel, MD  ondansetron (ZOFRAN) 4 MG tablet Take 1 tablet (4 mg total) by mouth every 8 (eight) hours as needed for nausea or vomiting. 08/26/16   Governor Rooks, MD  rifaximin (XIFAXAN) 550 MG TABS tablet Take 1 tablet (550 mg total) by mouth 2 (two) times daily. 07/09/16   Katharina Caper, MD  spironolactone (ALDACTONE) 50 MG tablet Take 1 tablet (50 mg total) by mouth 2 (two) times daily. 07/09/16   Katharina Caper, MD  sucralfate (CARAFATE) 1 g tablet Take 1 g by mouth 4 (four) times daily.    Historical Provider, MD  thiamine 100 MG tablet Take 1 tablet (100 mg total) by mouth daily. 12/19/16   Ramonita Lab, MD  traZODone (DESYREL) 100 MG tablet Take 100 mg by mouth at bedtime as needed for sleep.     Historical Provider, MD    Allergies Patient has no known allergies.    Social History Social History  Substance Use Topics  . Smoking status: Never Smoker  . Smokeless tobacco: Never Used  .  Alcohol use 3.6 oz/week    6 Cans of beer per week    Review of Systems Patient denies headaches, rhinorrhea, blurry vision, numbness, shortness of breath, chest pain, edema, cough, abdominal pain, nausea, vomiting, diarrhea, dysuria, fevers, rashes or hallucinations unless otherwise stated above in HPI. ____________________________________________   PHYSICAL EXAM:  VITAL SIGNS: Vitals:   12/29/16 1826  BP: 120/63  Pulse: 85  Resp: 18  Temp: 98.7 F (37.1 C)    Constitutional: Alert and oriented. Chronically ill appearing but in no acute distress. Eyes: Conjunctivae are normal. PERRL. EOMI. Head: Atraumatic. Nose: No congestion/rhinnorhea. Mouth/Throat: Mucous membranes are moist.  Oropharynx non-erythematous. Neck: No stridor. Painless ROM. No cervical spine tenderness to palpation Hematological/Lymphatic/Immunilogical: No cervical lymphadenopathy. Cardiovascular: Normal rate, regular rhythm.  Grossly normal heart sounds.  Good peripheral circulation. Respiratory: Normal respiratory effort.  No retractions. Lungs with diminished breathsounds bilaterally Gastrointestinal: Soft and nontender. Obese, no significant fluid wave. No abdominal bruits. No CVA tenderness. Genitourinary:  Musculoskeletal: No lower extremity tenderness nor edema.  No joint effusions. Neurologic:  Slowed speech, +  liver flap. Bilateral UE dysmetria Skin:  Skin is warm, dry and intact. No rash noted.   ____________________________________________   LABS (all labs ordered are listed, but only abnormal results are displayed)  Results for orders placed or performed during the hospital encounter of 12/29/16 (from the past 24 hour(s))  Basic metabolic panel     Status: Abnormal   Collection Time: 12/29/16  6:31 PM  Result Value Ref Range   Sodium 136 135 - 145 mmol/L   Potassium 4.6 3.5 - 5.1 mmol/L   Chloride 97 (L) 101 - 111 mmol/L   CO2 32 22 - 32 mmol/L   Glucose, Bld 248 (H) 65 - 99 mg/dL   BUN 10 6 - 20 mg/dL   Creatinine, Ser 1.61 0.61 - 1.24 mg/dL   Calcium 9.3 8.9 - 09.6 mg/dL   GFR calc non Af Amer >60 >60 mL/min   GFR calc Af Amer >60 >60 mL/min   Anion gap 7 5 - 15  CBC     Status: Abnormal   Collection Time: 12/29/16  6:31 PM  Result Value Ref Range   WBC 7.0 3.8 - 10.6 K/uL   RBC 3.99 (L) 4.40 - 5.90 MIL/uL   Hemoglobin 10.0 (L) 13.0 - 18.0 g/dL   HCT 04.5 (L) 40.9 - 81.1 %   MCV 80.7 80.0 - 100.0 fL   MCH 24.9 (L) 26.0 - 34.0 pg   MCHC 30.9 (L) 32.0 - 36.0 g/dL   RDW 91.4 (H) 78.2 - 95.6 %   Platelets 170 150 - 440 K/uL  Ammonia     Status: Abnormal   Collection Time: 12/29/16  6:32 PM  Result Value Ref Range   Ammonia 89 (H) 9 - 35 umol/L  Hepatic function panel     Status: Abnormal   Collection Time: 12/29/16  9:07 PM  Result Value Ref Range   Total Protein 8.4 (H) 6.5 - 8.1 g/dL   Albumin 3.6 3.5 - 5.0 g/dL   AST 39 15 - 41 U/L   ALT 28 17 - 63 U/L   Alkaline  Phosphatase 107 38 - 126 U/L   Total Bilirubin 0.8 0.3 - 1.2 mg/dL   Bilirubin, Direct <2.1 (L) 0.1 - 0.5 mg/dL   Indirect Bilirubin NOT CALCULATED 0.3 - 0.9 mg/dL  Protime-INR     Status: None   Collection Time: 12/29/16  9:07 PM  Result Value Ref  Range   Prothrombin Time 15.1 11.4 - 15.2 seconds   INR 1.18    ____________________________________________  EKG My review and personal interpretation at Time: 18:31   Indication: ams  Rate: 85  Rhythm: sinus Axis: normal Other: normal intervals, no st changes ____________________________________________  RADIOLOGY  I personally reviewed all radiographic images ordered to evaluate for the above acute complaints and reviewed radiology reports and findings.  These findings were personally discussed with the patient.  Please see medical record for radiology report.  ____________________________________________   PROCEDURES  Procedure(s) performed:  Procedures    Critical Care performed: no ____________________________________________   INITIAL IMPRESSION / ASSESSMENT AND PLAN / ED COURSE  Pertinent labs & imaging results that were available during my care of the patient were reviewed by me and considered in my medical decision making (see chart for details).  DDX: Dehydration, sepsis, pna, uti, hypoglycemia, cva, drug effect, withdrawal, encephalitis   Howard [name] is a 36 y.o. who presents to the ED with chief complaint of weakness and altered mental status. Patient appears to have acute and recurrent hepatic encephalopathy. Likely secondary to persistent alcohol abuse and noncompliance with his lactulose. Blood work is otherwise stable. Blood work is stable. Patient given lactulose here in the ER. Patient will need admission to hospital for his encephalopathy as he is unable to ambulate.  Have discussed with the patient and available family all diagnostics and treatments performed thus far and all questions were answered to the  best of my ability. The patient demonstrates understanding and agreement with plan.       ____________________________________________   FINAL CLINICAL IMPRESSION(S) / ED DIAGNOSES  Final diagnoses:  Hepatic encephalopathy (HCC)      NEW MEDICATIONS STARTED DURING THIS VISIT:  New Prescriptions   No medications on file     Note:  This document was prepared using Dragon voice recognition software and may include unintentional dictation errors.    Willy Eddy, MD 12/29/16 [phone]

## 2016-12-29 NOTE — ED Triage Notes (Signed)
Patient presents to the ED with increasing weakness x 1 week.  Patient has liver disease and reports he had a 6 pack of beer yesterday and he only took his lactulose 1 time today instead of the normal 3-4 times.  Patient states he felt nauseous while trying to eat today.  [name] member states, "he was staggering in the yard."

## 2016-12-29 NOTE — ED Notes (Signed)
Pt notified urine sample is needed, pt given urinal for sample. Pt verbalized understanding of this.

## 2016-12-29 NOTE — ED Notes (Addendum)
Pt states hx of liver disease, states he has not taken his lactulose "as I should." Pt states he also drank beer yest. Pt states increased weakness over the last "few days, where he almost falls." Pt also states SHOB when weakness began. Pt is on chronic O2 at home at 2L and is currently at 95% on 2L.

## 2016-12-30 LAB — URINALYSIS, COMPLETE (UACMP) WITH MICROSCOPIC
Bacteria, UA: NONE SEEN
Bilirubin Urine: NEGATIVE
Glucose, UA: NEGATIVE mg/dL
Ketones, ur: NEGATIVE mg/dL
LEUKOCYTES UA: NEGATIVE
NITRITE: NEGATIVE
PROTEIN: 30 mg/dL — AB
SPECIFIC GRAVITY, URINE: 1.016 (ref 1.005–1.030)
Squamous Epithelial / LPF: NONE SEEN
pH: 6 (ref 5.0–8.0)

## 2016-12-30 LAB — CBC
HEMATOCRIT: 31.8 % — AB (ref 40.0–52.0)
Hemoglobin: 9.9 g/dL — ABNORMAL LOW (ref 13.0–18.0)
MCH: 25.2 pg — AB (ref 26.0–34.0)
MCHC: 31.3 g/dL — AB (ref 32.0–36.0)
MCV: 80.6 fL (ref 80.0–100.0)
Platelets: 151 10*3/uL (ref 150–440)
RBC: 3.95 MIL/uL — ABNORMAL LOW (ref 4.40–5.90)
RDW: 21 % — AB (ref 11.5–14.5)
WBC: 8.7 10*3/uL (ref 3.8–10.6)

## 2016-12-30 LAB — BASIC METABOLIC PANEL
Anion gap: 7 (ref 5–15)
BUN: 12 mg/dL (ref 6–20)
CHLORIDE: 98 mmol/L — AB (ref 101–111)
CO2: 35 mmol/L — ABNORMAL HIGH (ref 22–32)
Calcium: 8.9 mg/dL (ref 8.9–10.3)
Creatinine, Ser: 0.95 mg/dL (ref 0.61–1.24)
GFR calc Af Amer: >60 mL/min (ref >60)
GFR calc non Af Amer: >60 mL/min (ref >60)
GLUCOSE: 121 mg/dL — AB (ref 65–99)
POTASSIUM: 3.9 mmol/L (ref 3.5–5.1)
Sodium: 140 mmol/L (ref 135–145)

## 2016-12-30 LAB — AMMONIA: Ammonia: 85 umol/L — ABNORMAL HIGH (ref 9–35)

## 2016-12-30 LAB — GLUCOSE, CAPILLARY
GLUCOSE-CAPILLARY: 109 mg/dL — AB (ref 65–99)
Glucose-Capillary: 72 mg/dL (ref 65–99)
Glucose-Capillary: 132 mg/dL — ABNORMAL HIGH (ref 65–99)
Glucose-Capillary: 154 mg/dL — ABNORMAL HIGH (ref 65–99)
Glucose-Capillary: 137 mg/dL — ABNORMAL HIGH (ref 65–99)

## 2016-12-30 MED ORDER — LORAZEPAM 1 MG PO TABS
1.0000 mg | ORAL_TABLET | Freq: Four times a day (QID) | ORAL | Status: DC | PRN
  Filled 2016-12-30: qty 1

## 2016-12-30 MED ORDER — NADOLOL 20 MG PO TABS
20.0000 mg | ORAL_TABLET | Freq: Every day | ORAL | Status: DC
  Administered 2016-12-30 – 2017-01-01 (×3): 20 mg via ORAL
  Filled 2016-12-30 (×3): qty 1

## 2016-12-30 MED ORDER — RIFAXIMIN 550 MG PO TABS
550.0000 mg | ORAL_TABLET | Freq: Two times a day (BID) | ORAL | Status: DC
  Administered 2016-12-30 – 2017-01-01 (×6): 550 mg via ORAL
  Filled 2016-12-30 (×6): qty 1

## 2016-12-30 MED ORDER — THIAMINE HCL 100 MG/ML IJ SOLN: 100.0000 mg | Freq: Every day | INTRAMUSCULAR | Status: DC

## 2016-12-30 MED ORDER — VITAMIN B-1 100 MG PO TABS
100.0000 mg | ORAL_TABLET | Freq: Every day | ORAL | Status: DC
  Administered 2016-12-30 – 2017-01-01 (×3): 100 mg via ORAL
  Filled 2016-12-30 (×3): qty 1

## 2016-12-30 MED ORDER — INSULIN ASPART 100 UNIT/ML ~~LOC~~ SOLN
0.0000 [IU] | Freq: Three times a day (TID) | SUBCUTANEOUS | Status: DC
  Administered 2016-12-30: 2 [IU] via SUBCUTANEOUS
  Administered 2016-12-30 (×2): 1 [IU] via SUBCUTANEOUS
  Administered 2016-12-31: 2 [IU] via SUBCUTANEOUS
  Filled 2016-12-30 (×3): qty 1
  Filled 2016-12-30 (×2): qty 2

## 2016-12-30 MED ORDER — ONDANSETRON HCL 4 MG PO TABS: 4.0000 mg | ORAL_TABLET | Freq: Four times a day (QID) | ORAL | Status: DC | PRN

## 2016-12-30 MED ORDER — ACAMPROSATE CALCIUM 333 MG PO TBEC
666.0000 mg | DELAYED_RELEASE_TABLET | Freq: Three times a day (TID) | ORAL | Status: DC
  Administered 2016-12-30 – 2017-01-01 (×6): 666 mg via ORAL
  Filled 2016-12-30 (×6): qty 2

## 2016-12-30 MED ORDER — LORAZEPAM 2 MG PO TABS: 0.0000 mg | ORAL_TABLET | Freq: Two times a day (BID) | ORAL | Status: DC

## 2016-12-30 MED ORDER — SUCRALFATE 1 G PO TABS
1.0000 g | ORAL_TABLET | Freq: Four times a day (QID) | ORAL | Status: DC
  Administered 2016-12-30 – 2017-01-01 (×9): 1 g via ORAL
  Filled 2016-12-30 (×9): qty 1

## 2016-12-30 MED ORDER — ONDANSETRON HCL 4 MG/2ML IJ SOLN: 4.0000 mg | Freq: Four times a day (QID) | INTRAMUSCULAR | Status: DC | PRN

## 2016-12-30 MED ORDER — CITALOPRAM HYDROBROMIDE 20 MG PO TABS
20.0000 mg | ORAL_TABLET | ORAL | Status: DC
  Administered 2016-12-30 – 2017-01-01 (×3): 20 mg via ORAL
  Filled 2016-12-30 (×3): qty 1

## 2016-12-30 MED ORDER — FOLIC ACID 1 MG PO TABS
1.0000 mg | ORAL_TABLET | Freq: Every day | ORAL | Status: DC
Start: 2016-12-30 — End: 2017-01-01
  Administered 2016-12-30 – 2016-12-31 (×2): 1 mg via ORAL
  Filled 2016-12-30 (×4): qty 1

## 2016-12-30 MED ORDER — LACTULOSE 10 GM/15ML PO SOLN
30.0000 g | Freq: Four times a day (QID) | ORAL | Status: DC
  Administered 2016-12-30 – 2017-01-01 (×9): 30 g via ORAL
  Filled 2016-12-30 (×9): qty 60

## 2016-12-30 MED ORDER — ENOXAPARIN SODIUM 40 MG/0.4ML ~~LOC~~ SOLN
40.0000 mg | SUBCUTANEOUS | Status: DC
  Administered 2016-12-30: 40 mg via SUBCUTANEOUS
  Filled 2016-12-30: qty 0.4

## 2016-12-30 MED ORDER — OLANZAPINE 5 MG PO TABS
30.0000 mg | ORAL_TABLET | Freq: Every day | ORAL | Status: DC
  Administered 2016-12-30 – 2016-12-31 (×2): 30 mg via ORAL
  Filled 2016-12-30 (×2): qty 6

## 2016-12-30 MED ORDER — ADULT MULTIVITAMIN W/MINERALS CH
1.0000 | ORAL_TABLET | Freq: Every day | ORAL | Status: DC
  Administered 2016-12-30 – 2017-01-01 (×3): 1 via ORAL
  Filled 2016-12-30 (×3): qty 1

## 2016-12-30 MED ORDER — LORAZEPAM 2 MG/ML IJ SOLN: 1.0000 mg | Freq: Four times a day (QID) | INTRAMUSCULAR | Status: DC | PRN

## 2016-12-30 MED ORDER — SPIRONOLACTONE 25 MG PO TABS
50.0000 mg | ORAL_TABLET | Freq: Two times a day (BID) | ORAL | Status: DC
  Administered 2016-12-30 – 2017-01-01 (×6): 50 mg via ORAL
  Filled 2016-12-30: qty 2
  Filled 2016-12-30: qty 1
  Filled 2016-12-30 (×2): qty 2
  Filled 2016-12-30 (×2): qty 1
  Filled 2016-12-30: qty 2

## 2016-12-30 MED ORDER — LORAZEPAM 2 MG PO TABS
0.0000 mg | ORAL_TABLET | Freq: Four times a day (QID) | ORAL | Status: DC
  Administered 2016-12-30 (×2): 1 mg via ORAL
  Filled 2016-12-30 (×2): qty 1

## 2016-12-30 MED ORDER — INSULIN ASPART 100 UNIT/ML ~~LOC~~ SOLN: 0.0000 [IU] | Freq: Every day | SUBCUTANEOUS | Status: DC

## 2016-12-30 MED ORDER — ENOXAPARIN SODIUM 40 MG/0.4ML ~~LOC~~ SOLN
40.0000 mg | Freq: Two times a day (BID) | SUBCUTANEOUS | Status: DC
  Administered 2016-12-30 – 2017-01-01 (×4): 40 mg via SUBCUTANEOUS
  Filled 2016-12-30 (×4): qty 0.4

## 2016-12-30 MED ORDER — LEVETIRACETAM 750 MG PO TABS
1500.0000 mg | ORAL_TABLET | Freq: Two times a day (BID) | ORAL | Status: DC
  Administered 2016-12-30 – 2017-01-01 (×6): 1500 mg via ORAL
  Filled 2016-12-30 (×6): qty 2

## 2016-12-30 NOTE — Care Management Obs Status (Signed)
MEDICARE OBSERVATION STATUS NOTIFICATION   Patient Details  Name: GUNNARD DORRANCE MRN: [id number] Date of Birth: 11/18/1980   Medicare Observation Status Notification Given:  Yes    Marily Memos, RN 12/30/2016, 9:45 AM

## 2016-12-30 NOTE — Progress Notes (Signed)
The patient has generalized weakness, he is alert, awake but looks confused, react slowly.  Vital sign is stable.  Physical examination is unremarkable.   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  ammonia is elevated, he was given lactulose in the ED,  ammonia level decreased to 85, continue lactulose, rifaximin and spironolactone.    Alcohol abuse - CIWA protocol   Paranoid schizophrenia (HCC) - continue home meds   Hypertension - stable continue home meds   Type 2 diabetes mellitus (HCC) - lighting scale insulin with corresponding glucose checks  Time spent about 26 minutes.

## 2016-12-30 NOTE — H&P (Signed)
[facility] [place] at [facility]   PATIENT NAME: Howard [name]    MR#:  [id number]  DATE OF BIRTH:  01/14/1981  DATE OF ADMISSION:  12/29/2016  PRIMARY CARE PHYSICIAN: Marisue Ivan, MD   REQUESTING/REFERRING PHYSICIAN: Roxan Hockey, MD  CHIEF COMPLAINT:   Chief Complaint  Patient presents with  . Weakness    HISTORY OF PRESENT ILLNESS:  Howard [name]  is a 36 y.o. male who presents with Weakness, slowed mentation. Patient has a known history of alcohol-induced cirrhosis, and often needs admission due to hyperammonemia induced hepatic encephalopathy due to missing lactulose doses. He admits to the same tonight. Hospitalists were called for admission.  PAST MEDICAL HISTORY:   Past Medical History:  Diagnosis Date  . Alcoholic cirrhosis of liver with ascites (HCC)   . Anemia   . COPD (chronic obstructive pulmonary disease) (HCC)   . Depression   . Diabetes (HCC)   . Diabetes mellitus, type II (HCC)   . Esophageal varices (HCC)   . Heart disease   . Hyperlipemia   . Hypertension   . Liver disease   . Multiple thyroid nodules   . Portal hypertensive gastropathy   . Schizophrenia (HCC)   . Seizures (HCC)     PAST SURGICAL HISTORY:   Past Surgical History:  Procedure Laterality Date  . ESOPHAGOGASTRODUODENOSCOPY N/A 10/05/2015   Procedure: ESOPHAGOGASTRODUODENOSCOPY (EGD);  Surgeon: Elnita Maxwell, MD;  Location: [facility] ENDOSCOPY;  Service: Endoscopy;  Laterality: N/A;  . ESOPHAGOGASTRODUODENOSCOPY (EGD) WITH PROPOFOL N/A 08/03/2015   Procedure: ESOPHAGOGASTRODUODENOSCOPY (EGD) WITH PROPOFOL;  Surgeon: Elnita Maxwell, MD;  Location: [facility] ENDOSCOPY;  Service: Endoscopy;  Laterality: N/A;  . ESOPHAGOGASTRODUODENOSCOPY (EGD) WITH PROPOFOL N/A 08/31/2015   Procedure: ESOPHAGOGASTRODUODENOSCOPY (EGD) WITH PROPOFOL;  Surgeon: Elnita Maxwell, MD;  Location[facility] ENDOSCOPY;  Service: Endoscopy;  Laterality: N/A;  . ESOPHAGOGASTRODUODENOSCOPY  (EGD) WITH PROPOFOL N/A 04/04/2016   Procedure: ESOPHAGOGASTRODUODENOSCOPY (EGD) WITH PROPOFOL;  Surgeon: Scot Jun, MD;  Location: [facility] ENDOSCOPY;  Service: Endoscopy;  Laterality: N/A;  . ESOPHAGOGASTRODUODENOSCOPY (EGD) WITH PROPOFOL N/A 11/13/2016   Procedure: ESOPHAGOGASTRODUODENOSCOPY (EGD) WITH PROPOFOL;  Surgeon: Wyline Mood, MD;  Location: [facility] ENDOSCOPY;  Service: Endoscopy;  Laterality: N/A;  . NO PAST SURGERIES      SOCIAL HISTORY:   Social History  Substance Use Topics  . Smoking status: Never Smoker  . Smokeless tobacco: Never Used  . Alcohol use 3.6 oz/week    6 Cans of beer per week    FAMILY HISTORY:   Family History  Problem Relation Age of Onset  . Heart disease Mother   . Hypertension Mother   . Hyperlipidemia Mother   . Stroke Father   . Heart attack Father   . Hypertension Father   . Heart disease Father   . Alcohol abuse Father   . Heart disease Brother     DRUG ALLERGIES:  No Known Allergies  MEDICATIONS AT HOME:   Prior to Admission medications   Medication Sig Start Date End Date Taking? Authorizing Provider  acamprosate (CAMPRAL) 333 MG tablet Take 2 tablets (666 mg total) by mouth 3 (three) times daily with meals. 08/19/16  Yes Audery Amel, MD  albuterol (PROVENTIL HFA;VENTOLIN HFA) 108 (90 Base) MCG/ACT inhaler Inhale 2 puffs into the lungs every 6 (six) hours as needed for wheezing or shortness of breath. 10/08/16  Yes Shaune Pollack, MD  citalopram (CELEXA) 20 MG tablet Take 1 tablet (20 mg total) by mouth every morning. 08/19/16  Yes Audery Amel, MD  folic acid (FOLVITE) 1 MG tablet Take 1 tablet (1 mg total) by mouth daily. 12/19/16  Yes Ramonita Lab, MD  lactulose (CHRONULAC) 10 GM/15ML solution Take 45 mLs (30 g total) by mouth every 4 (four) hours. Patient taking differently: Take 30 g by mouth 4 (four) times daily.  07/09/16  Yes Katharina Caper, MD  levETIRAcetam (KEPPRA) 750 MG tablet Take 2 tablets (1,500 mg total) by mouth 2 (two)  times daily. 12/11/16 02/09/17 Yes Merrily Brittle, MD  losartan (COZAAR) 25 MG tablet Take 25 mg by mouth daily.   Yes Historical Provider, MD  metFORMIN (GLUCOPHAGE) 500 MG tablet Take 1 tablet (500 mg total) by mouth 2 (two) times daily with a meal. 05/15/16  Yes Alford Highland, MD  Multiple Vitamin (MULTIVITAMIN WITH MINERALS) TABS tablet Take 1 tablet by mouth daily. 08/04/15  Yes Ramonita Lab, MD  nadolol (CORGARD) 20 MG tablet Take 20 mg by mouth daily.   Yes Historical Provider, MD  OLANZapine (ZYPREXA) 15 MG tablet Take 2 tablets (30 mg total) by mouth at bedtime. 05/22/16  Yes Audery Amel, MD  ondansetron (ZOFRAN) 4 MG tablet Take 1 tablet (4 mg total) by mouth every 8 (eight) hours as needed for nausea or vomiting. 08/26/16  Yes Governor Rooks, MD  rifaximin (XIFAXAN) 550 MG TABS tablet Take 1 tablet (550 mg total) by mouth 2 (two) times daily. 07/09/16  Yes Katharina Caper, MD  spironolactone (ALDACTONE) 50 MG tablet Take 1 tablet (50 mg total) by mouth 2 (two) times daily. 07/09/16  Yes Katharina Caper, MD  sucralfate (CARAFATE) 1 g tablet Take 1 g by mouth 4 (four) times daily.   Yes Historical Provider, MD  thiamine 100 MG tablet Take 1 tablet (100 mg total) by mouth daily. 12/19/16  Yes Ramonita Lab, MD  traZODone (DESYREL) 100 MG tablet Take 100 mg by mouth at bedtime as needed for sleep.    Yes Historical Provider, MD    REVIEW OF SYSTEMS:  Review of Systems  Constitutional: Negative for chills, fever, malaise/fatigue and weight loss.  HENT: Negative for ear pain, hearing loss and tinnitus.   Eyes: Negative for blurred vision, double vision, pain and redness.  Respiratory: Negative for cough, hemoptysis and shortness of breath.   Cardiovascular: Negative for chest pain, palpitations, orthopnea and leg swelling.  Gastrointestinal: Negative for abdominal pain, constipation, diarrhea, nausea and vomiting.  Genitourinary: Negative for dysuria, frequency and hematuria.  Musculoskeletal:  Negative for back pain, joint pain and neck pain.  Skin:       No acne, rash, or lesions  Neurological: Positive for weakness. Negative for dizziness, tremors and focal weakness.       Confusion  Endo/Heme/Allergies: Negative for polydipsia. Does not bruise/bleed easily.  Psychiatric/Behavioral: Negative for depression. The patient is not nervous/anxious and does not have insomnia.      VITAL SIGNS:   Vitals:   12/29/16 2103 12/29/16 2215 12/29/16 2245 12/29/16 2315  BP: 121/74 124/72 135/79 124/70  Pulse: 75 77 76 73  Resp: 20 19 (!) 22 20  Temp:      TempSrc:      SpO2: 95% 96% 95% 95%  Weight:      Height:       Wt Readings from Last 3 Encounters:  12/29/16 (!) 166 kg (366 lb)  12/21/16 (!) 166 kg (366 lb)  12/17/16 (!) 166 kg (366 lb)    PHYSICAL EXAMINATION:  Physical Exam  Vitals reviewed. Constitutional:  He is oriented to person, place, and time. He appears well-developed and well-nourished. No distress.  HENT:  Head: Normocephalic and atraumatic.  Mouth/Throat: Oropharynx is clear and moist.  Eyes: Conjunctivae and EOM are normal. Pupils are equal, round, and reactive to light. No scleral icterus.  Neck: Normal range of motion. Neck supple. No JVD present. No thyromegaly present.  Cardiovascular: Normal rate, regular rhythm and intact distal pulses.  Exam reveals no gallop and no friction rub.   No murmur heard. Respiratory: Effort normal and breath sounds normal. No respiratory distress. He has no wheezes. He has no rales.  GI: Soft. Bowel sounds are normal. He exhibits no distension. There is no tenderness.  Musculoskeletal: Normal range of motion. He exhibits no edema.  No arthritis, no gout  Lymphadenopathy:    He has no cervical adenopathy.  Neurological: He is alert and oriented to person, place, and time. No cranial nerve deficit.  No dysarthria, no aphasia. Positive asterixis  Skin: Skin is warm and dry. No rash noted. No erythema.  Psychiatric:  Flat  affect, this is baseline    LABORATORY PANEL:   CBC  Recent Labs Lab 12/29/16 1831  WBC 7.0  HGB 10.0*  HCT 32.2*  PLT 170   ------------------------------------------------------------------------------------------------------------------  Chemistries   Recent Labs Lab 12/29/16 1831 12/29/16 2107  NA 136  --   K 4.6  --   CL 97*  --   CO2 32  --   GLUCOSE 248*  --   BUN 10  --   CREATININE 0.99  --   CALCIUM 9.3  --   AST  --  39  ALT  --  28  ALKPHOS  --  107  BILITOT  --  0.8   ------------------------------------------------------------------------------------------------------------------  Cardiac Enzymes No results for input(s): TROPONINI in the last 168 hours. ------------------------------------------------------------------------------------------------------------------  RADIOLOGY:  Dg Chest Portable 1 View  Result Date: 12/29/2016 CLINICAL DATA:  Acute onset of generalized weakness. Nausea. Initial encounter. EXAM: PORTABLE CHEST 1 VIEW COMPARISON:  Chest radiograph performed 12/17/2016 FINDINGS: The lungs are well-aerated. Mild vascular congestion is noted. Mild left basilar atelectasis is noted. There is no evidence of pleural effusion or pneumothorax. The cardiomediastinal silhouette is borderline normal in size. No acute osseous abnormalities are seen. IMPRESSION: Mild vascular congestion.  Mild left basilar atelectasis noted. Electronically Signed   By: Roanna Raider M.D.   On: 12/29/2016 22:44    EKG:   Orders placed or performed during the hospital encounter of 12/29/16  . ED EKG  . ED EKG    IMPRESSION AND PLAN:  Principal Problem:   Hepatic encephalopathy (HCC) - ammonia is elevated, he was given lactulose in the ED, we will reinitiate his necessary dose of lactulose. Active Problems:   Alcohol abuse - CIWA protocol   Paranoid schizophrenia (HCC) - continue home meds   Hypertension - stable continue home meds   Type 2 diabetes mellitus  (HCC) - lighting scale insulin with corresponding glucose checks  All the records are reviewed and case discussed with ED provider. Management plans discussed with the patient and/or family.  DVT PROPHYLAXIS: SubQ lovenox  GI PROPHYLAXIS: None  ADMISSION STATUS: Observation  CODE STATUS: Full Code Status History    Date Active Date Inactive Code Status Order ID Comments User Context   12/18/2016 12:25 AM 12/19/2016  9:28 PM Full Code [id number]  Katharina Caper, MD Inpatient   12/12/2016 11:40 PM 12/15/2016  8:28 PM Full Code [id number]  Arnaldo Natal, MD  Inpatient   11/24/2016  1:11 AM 11/25/2016  4:42 PM Full Code [id number]  Tonye Royalty, DO Inpatient   11/12/2016  3:36 PM 11/14/2016  2:36 PM Full Code [id number]  Auburn Bilberry, MD Inpatient   10/04/2016  6:04 PM 10/08/2016  3:44 PM Full Code [id number]  Shaune Pollack, MD Inpatient   09/22/2016  2:29 AM 09/23/2016  6:38 PM Full Code [id number]  Oralia Manis, MD Inpatient   08/07/2016  7:37 PM 08/12/2016  8:51 PM Full Code [id number]  Gracelyn Nurse, MD Inpatient   07/07/2016  1:40 AM 07/09/2016  6:29 PM Full Code [id number]  Ihor Austin, MD Inpatient   06/10/2016  6:21 AM 06/11/2016  8:25 PM Full Code [id number]  Ihor Austin, MD Inpatient   05/12/2016  3:55 AM 05/15/2016  2:57 PM Full Code [id number]  Ihor Austin, MD Inpatient   04/28/2016  2:54 AM 04/29/2016  6:23 PM Full Code [id number]  Tonye Royalty, DO Inpatient   03/14/2016 12:30 PM 03/16/2016  3:41 PM Full Code [id number]  Shaune Pollack, MD Inpatient   03/03/2016 12:13 PM 03/06/2016  2:26 PM Full Code [id number]  Milagros Loll, MD ED   02/10/2016  5:54 PM 02/12/2016  7:34 PM Full Code [id number]  Ramonita Lab, MD Inpatient   11/11/2015  5:16 AM 11/13/2015  6:00 PM Full Code [id number]  Ihor Austin, MD Inpatient   11/07/2015 10:02 PM 11/09/2015  4:56 PM Full Code [id number]  Enid Baas, MD Inpatient   10/28/2015  8:14 PM 10/29/2015  2:26 PM Full Code [id number]  Altamese Dilling, MD Inpatient   10/16/2015  11:47 PM 10/17/2015  6:00 PM Full Code [id number]  Oralia Manis, MD Inpatient   07/30/2015  5:35 PM 08/04/2015  8:34 PM Full Code [id number]  Enedina Finner, MD Inpatient   07/05/2015  1:16 AM 07/08/2015  7:22 PM Full Code [id number]  Oralia Manis, MD Inpatient   05/30/2015  7:28 PM 06/01/2015  3:36 PM Full Code [id number]  Audery Amel, MD Inpatient      TOTAL TIME TAKING CARE OF THIS PATIENT: 40 minutes.   Shammond Arave FIELDING 12/30/2016, 12:22 AM  [facility]  Office  [phone]  CC: Primary care physician; Marisue Ivan, MD  Note:  This document was prepared using Dragon voice recognition software and may include unintentional dictation errors.

## 2016-12-30 NOTE — Progress Notes (Signed)
Order for enoxaparin 40 mg subcutaneously daily for DVT prophylaxis was changed to enoxaparin 40 BID per anticoagulation protocol for CrCl > 30 mL/min and BMI >40.  Cindi Carbon, PharmD Clinical Pharmacist 12/30/16 8:11 AM

## 2016-12-30 NOTE — Evaluation (Signed)
Physical Therapy Evaluation Patient Details Name: Howard [name] MRN: [id number] DOB: 07-Sep-1981 Today's Date: 12/30/2016   History of Present Illness  36 y.o. male.  Pt recently discharged 3/26 and 4/1, now observation status with persistent seizures and hepatic encephalopathy. PMH: alcoholic cirrhosis of liver, anemia, COPD, depression, DM, htn, schizophrenia, and esophageal varices.  Clinical Impression  Pt did well with bed mobility, transfers and ambulation.  He was able to walk ~200 ft with walker and though he had some minimal fatigue on 2 liters he generally did well and agreed that if he takes care of himself at home he should be okay.  Mother present in room and agreed that he was near his baseline and that he would not likely need HHPT.  Will maintain on PT list to trial steps and insure that he is staying active.    Follow Up Recommendations No PT follow up    Equipment Recommendations       Recommendations for Other Services       Precautions / Restrictions Precautions Precautions: Fall Restrictions Weight Bearing Restrictions: No      Mobility  Bed Mobility Overal bed mobility: Independent Bed Mobility: Supine to Sit;Sit to Supine     Supine to sit: Modified independent (Device/Increase time) Sit to supine: Modified independent (Device/Increase time)   General bed mobility comments: Pt was easily able to get himself in/out of bed  Transfers Overall transfer level: Independent Equipment used: Rolling walker (2 wheeled) Transfers: Sit to/from Stand Sit to Stand: Modified independent (Device/Increase time)         General transfer comment: Pt was able to rise to standing with light UE use and did not need AD to maintain balance upon standing, good confidence and relative safety  Ambulation/Gait Ambulation/Gait assistance: Modified independent (Device/Increase time) Ambulation Distance (Feet): 200 Feet Assistive device: Rolling walker (2 wheeled) (2 liters  O2)       General Gait Details: Pt was able to ambulate with consistent and safe cadence.  He did not rely heavily on the walker and despite some minimal fatigue he did well and had no safety issues.   Stairs            Wheelchair Mobility    Modified Rankin (Stroke Patients Only)       Balance Overall balance assessment: Modified Independent                                           Pertinent Vitals/Pain Pain Assessment: No/denies pain    Home Living Family/patient expects to be discharged to:: Private residence Living Arrangements: Parent;Other relatives Available Help at Discharge: Available 24 hours/day;Family Type of Home: House Home Access: Stairs to enter Entrance Stairs-Rails: Doctor, general practice of Steps: 3 Home Layout: One level        Prior Function Level of Independence: Independent with assistive device(s)         Comments: Pt mod I with ambulation using a quad cane most of the time, only uses the walker sometimes; pt reports he falls at least 1x/month secondary to dizziness, with many more near falls     Hand Dominance        Extremity/Trunk Assessment   Upper Extremity Assessment Upper Extremity Assessment: Generalized weakness;Overall [facility] for tasks assessed    Lower Extremity Assessment Lower Extremity Assessment: Generalized weakness;Overall [facility] for tasks assessed  Communication   Communication: No difficulties  Cognition Arousal/Alertness: Awake/alert Behavior During Therapy: WFL for tasks assessed/performed Overall Cognitive Status: Within Functional Limits for tasks assessed                                        General Comments      Exercises     Assessment/Plan    PT Assessment Patient needs continued PT services  PT Problem List Decreased strength;Decreased balance       PT Treatment Interventions DME instruction;Gait training;Stair training;Functional  mobility training;Therapeutic activities;Therapeutic exercise;Balance training;Patient/family education    PT Goals (Current goals can be found in the Care Plan section)  Acute Rehab PT Goals Patient Stated Goal: be more active PT Goal Formulation: With patient Time For Goal Achievement: 01/06/17 Potential to Achieve Goals: Good    Frequency Min 2X/week   Barriers to discharge        Co-evaluation               End of Session Equipment Utilized During Treatment: Gait belt;Oxygen Activity Tolerance: Patient tolerated treatment well Patient left: with family/visitor present;with bed alarm set;with call bell/phone within reach Nurse Communication: Mobility status PT Visit Diagnosis: Muscle weakness (generalized) (M62.81);History of falling (Z91.81);Difficulty in walking, not elsewhere classified (R26.2)    Time: 3474-2595 PT Time Calculation (min) (ACUTE ONLY): 25 min   Charges:   PT Evaluation $PT Eval Low Complexity: 1 Procedure     PT G Codes:   PT G-Codes **NOT FOR INPATIENT CLASS** Functional Assessment Tool Used: AM-PAC 6 Clicks Basic Mobility Functional Limitation: Mobility: Walking and moving around Mobility: Walking and Moving Around Current Status (G3875): At least 1 percent but less than 20 percent impaired, limited or restricted Mobility: Walking and Moving Around Goal Status [phone]): 0 percent impaired, limited or restricted    Malachi Pro, DPT 12/30/2016, 3:16 PM

## 2016-12-30 NOTE — Progress Notes (Signed)
Shift assessment completed at 0735.Pt is resting in bed at that time with eyes closed, opened his eyes to voice but did not maintain wakefulness and had to be awakened a couple times during assessment. O2 is on at [facility], this is increased to 3L as pt sats were in high 80's, pt is mouth breathing. Pt is oriented. Lungs are clear bilat, Hr is regular, abdomen is distended, soft, bs heard. Pt does not appear to be jaundiced. Pt has urinal at bedside, ppp, trace pitting edema to rle. Pt denied pain.since assessment, pt has refused ativan per ciwa protocol. Pt is cooperative with care, mother currently at bedside. Pt in no distress. Delay noted between questions asked and timing of pt's answers. Dr. Imogene Burn has been in to round on pt.

## 2016-12-30 NOTE — Plan of Care (Signed)
Problem: Bowel/Gastric: Goal: Will not experience complications related to bowel motility Outcome: Progressing Pt is compliant with medications this shift, and so is progressing toward goals.

## 2016-12-30 NOTE — Progress Notes (Signed)
While going in to give patient his ativan, we discuss the importance of Lactulose(CHRONULAC) to him and his mother who was at bedside. Mother stated that "He has meds by time for pills but we just forgot about the Lactulose". We stressed that the Lactulose is life saving for him at this time and that this is what keeps his ammonia level down and mother also voice understanding.

## 2016-12-30 NOTE — Care Management Note (Addendum)
Case Management Note  Patient Details  Name: KAELOB PERSKY MRN: [id number] Date of Birth: April 30, 1981  Subjective/Objective:  Met with patient at bedside to deliver observation letter. Patient is a readmit for hepatic encephalopathy. Noncompliant with lactulose. He lives at home with his mother and brother. Patient has chronic home O2 through [place]. Uses a rolling walker and cane. PCP is dr. Netty Starring. He is open to home health with [facility] for nursing and PT. I have requested [facility] add a Education officer, museum due to frequent admissions and noncompliance.                    Action/Plan: Following progression  Expected Discharge Date:                  Expected Discharge Plan:  [place]  In-House Referral:     Discharge planning Services  CM Consult  Post Acute Care Choice:  Home Health, Resumption of Svcs/PTA Provider Choice offered to:  Patient  DME Arranged:    DME Agency:     HH Arranged:  RN, PT [place] Agency:  [place]  Status of Service:  In process, will continue to follow  If discussed at Long Length of Stay Meetings, dates discussed:    Additional Comments:  Jolly Mango, RN 12/30/2016, 9:46 AM

## 2016-12-30 NOTE — Clinical Social Work Note (Addendum)
Clinical Social Work Assessment  Patient Details  Name: Howard [name] MRN: [id number] Date of Birth: 1980/12/26  Date of referral:  12/30/16               Reason for consult:  Substance Use/ETOH Abuse                Permission sought to share information with:    Permission granted to share information::     Name::        Agency::     Relationship::     Contact Information:     Housing/Transportation Living arrangements for the past 2 months:  Single Family Home Source of Information:  Patient Patient Interpreter Needed:  None Criminal Activity/Legal Involvement Pertinent to Current Situation/Hospitalization:  No - Comment as needed Significant Relationships:  Friend, Parents Lives with:  Self Do you feel safe going back to the place where you live?  Yes Need for family participation in patient care:  Yes (Comment)  Care giving concerns:  Patient lives at home in [place].    Social Worker assessment / plan:  Social work Systems developer consult from RN in progression rounds that patient has a history of alcohol abuse. Social work Theatre manager met with patient alone at bedside. Patient was sitting up in bed. Social work introduced self and explained role of social work department. Per patient, he lives at home in [place]. Patient reported that he has no children and does not have a HPOA. Patient stated that he drink a 6 pack of beer every other day. Patient also stated that he has been to an Alcohol Anonymous (AA) meeting before in [place], but did not like it and has never been back. Patient explained that he is trying to rekindle with old friends to break his alcohol use. Patient stated has great support from his parents. Social work Theatre manager left patient [employer] substance abuse resource list for patient to review and keep. Patient seemed interested in counseling if his alcohol use does not decrease. Social work Theatre manager will continue to assist and follow as needed.     Employment status:    Insurance information:  Medicare PT Recommendations:  Not assessed at this time Information / Referral to community resources:  Outpatient Substance Abuse Treatment Options  Patient/Family's Response to care:  Patient seemed interested in receiving help if alcohol dependence does not decrease.   Patient/Family's Understanding of and Emotional Response to Diagnosis, Current Treatment, and Prognosis:  Patient was pleasant and thanked social work Theatre manager for visiting and providing resources.   Emotional Assessment Appearance:  Appears stated age Attitude/Demeanor/Rapport:    Affect (typically observed):  Accepting, Adaptable, Appropriate Orientation:  Oriented to Self, Oriented to Place, Oriented to  Time, Oriented to Situation Alcohol / Substance use:  Alcohol Use Psych involvement (Current and /or in the community):  No (Comment)  Discharge Needs  Concerns to be addressed:  Basic Needs, Substance Abuse Concerns Readmission within the last 30 days:  No Current discharge risk:  Substance Abuse Barriers to Discharge:  Continued Medical Work up   [employer], [place] Work 12/30/2016, 11:45 AM

## 2016-12-30 NOTE — Progress Notes (Signed)
New Admission Note:   Arrival Method: per stretcher from ED, pt came from home Mental Orientation: alert and oriented X4 with occasional delayed response Telemetry: none ordered Assessment: Completed Skin: warm, dry, intact with no preexisting wounds/sore noted IV: G20 on the right AC with transparent dressing, flushed and saline locked Pain: denies pain as of this time Tubes: O2 inhalation at 2Lpm-chronic Safety Measures: Safety Fall Prevention Plan has been given and discussed Admission: Completed 1A Orientation: Patient has been oriented to the room, unit and staff.  Family: no family member present at bedside  Orders have been reviewed and implemented. Will continue to monitor patient. Call light has been placed within reach and bed alarm has been activated.   Janice Norrie BSN, RN [facility] 1A

## 2016-12-30 NOTE — ED Notes (Signed)
Pt given ginger ale, graham crackers and peanut butter. 

## 2016-12-31 DIAGNOSIS — K7290 Hepatic failure, unspecified without coma: Secondary | ICD-10-CM | POA: Diagnosis present

## 2016-12-31 DIAGNOSIS — Z7984 Long term (current) use of oral hypoglycemic drugs: Secondary | ICD-10-CM | POA: Diagnosis not present

## 2016-12-31 DIAGNOSIS — J449 Chronic obstructive pulmonary disease, unspecified: Secondary | ICD-10-CM | POA: Diagnosis present

## 2016-12-31 DIAGNOSIS — F200 Paranoid schizophrenia: Secondary | ICD-10-CM | POA: Diagnosis present

## 2016-12-31 DIAGNOSIS — E785 Hyperlipidemia, unspecified: Secondary | ICD-10-CM | POA: Diagnosis present

## 2016-12-31 DIAGNOSIS — D638 Anemia in other chronic diseases classified elsewhere: Secondary | ICD-10-CM | POA: Diagnosis present

## 2016-12-31 DIAGNOSIS — Z9114 Patient's other noncompliance with medication regimen: Secondary | ICD-10-CM | POA: Diagnosis not present

## 2016-12-31 DIAGNOSIS — Z8249 Family history of ischemic heart disease and other diseases of the circulatory system: Secondary | ICD-10-CM | POA: Diagnosis not present

## 2016-12-31 DIAGNOSIS — I10 Essential (primary) hypertension: Secondary | ICD-10-CM | POA: Diagnosis present

## 2016-12-31 DIAGNOSIS — E119 Type 2 diabetes mellitus without complications: Secondary | ICD-10-CM | POA: Diagnosis present

## 2016-12-31 DIAGNOSIS — Z9119 Patient's noncompliance with other medical treatment and regimen: Secondary | ICD-10-CM | POA: Diagnosis not present

## 2016-12-31 DIAGNOSIS — F1010 Alcohol abuse, uncomplicated: Secondary | ICD-10-CM | POA: Diagnosis present

## 2016-12-31 DIAGNOSIS — J9601 Acute respiratory failure with hypoxia: Secondary | ICD-10-CM | POA: Diagnosis present

## 2016-12-31 DIAGNOSIS — Z811 Family history of alcohol abuse and dependence: Secondary | ICD-10-CM | POA: Diagnosis not present

## 2016-12-31 DIAGNOSIS — E8779 Other fluid overload: Secondary | ICD-10-CM | POA: Diagnosis present

## 2016-12-31 DIAGNOSIS — Y92009 Unspecified place in unspecified non-institutional (private) residence as the place of occurrence of the external cause: Secondary | ICD-10-CM | POA: Diagnosis not present

## 2016-12-31 DIAGNOSIS — T473X6A Underdosing of saline and osmotic laxatives, initial encounter: Secondary | ICD-10-CM | POA: Diagnosis present

## 2016-12-31 DIAGNOSIS — K766 Portal hypertension: Secondary | ICD-10-CM | POA: Diagnosis present

## 2016-12-31 DIAGNOSIS — K7030 Alcoholic cirrhosis of liver without ascites: Secondary | ICD-10-CM | POA: Diagnosis present

## 2016-12-31 DIAGNOSIS — E662 Morbid (severe) obesity with alveolar hypoventilation: Secondary | ICD-10-CM | POA: Diagnosis present

## 2016-12-31 DIAGNOSIS — Z6841 Body Mass Index (BMI) 40.0 and over, adult: Secondary | ICD-10-CM | POA: Diagnosis not present

## 2016-12-31 LAB — GLUCOSE, CAPILLARY
GLUCOSE-CAPILLARY: 105 mg/dL — AB (ref 65–99)
GLUCOSE-CAPILLARY: 112 mg/dL — AB (ref 65–99)
GLUCOSE-CAPILLARY: 161 mg/dL — AB (ref 65–99)
Glucose-Capillary: 163 mg/dL — ABNORMAL HIGH (ref 65–99)

## 2016-12-31 LAB — AMMONIA: Ammonia: 63 umol/L — ABNORMAL HIGH (ref 9–35)

## 2016-12-31 MED ORDER — FUROSEMIDE 40 MG PO TABS
40.0000 mg | ORAL_TABLET | Freq: Two times a day (BID) | ORAL | Status: DC
  Administered 2016-12-31 – 2017-01-01 (×3): 40 mg via ORAL
  Filled 2016-12-31 (×3): qty 1

## 2016-12-31 NOTE — Plan of Care (Signed)
Problem: Bowel/Gastric: Goal: Will not experience complications related to bowel motility Outcome: Progressing Pt is progressing toward goals, lab values are improving.

## 2016-12-31 NOTE — Plan of Care (Signed)
Problem: Bowel/Gastric: Goal: Will not experience complications related to bowel motility Outcome: Not Progressing Pt with very poor po intake, pt is unable to follow commands, unable to orient to anything.

## 2016-12-31 NOTE — Progress Notes (Signed)
[facility]   PATIENT NAME: Howard [name]    MR#:  [id number]  DATE OF BIRTH:  06-23-1981  SUBJECTIVE:  CHIEF COMPLAINT:  Patient is drowzy. Poor historian He has no concerns.  REVIEW OF SYSTEMS:  Cannot obtain due to hepatic encephalopathy  DRUG ALLERGIES:  No Known Allergies  VITALS:  Blood pressure (!) 142/72, pulse 86, temperature 97.7 F (36.5 C), temperature source Oral, resp. rate (!) 24, height  (1.905 m), weight (!) 166 kg (366 lb), SpO2 93 %.  PHYSICAL EXAMINATION:  GENERAL:  36 y.o.-year-old patient lying in the bed with no acute distress.  EYES: Pupils equal, round, reactive to light and accommodation. No scleral icterus. Extraocular muscles intact.  HEENT: Head atraumatic, normocephalic. Oropharynx and nasopharynx clear.  NECK:  Supple, no jugular venous distention. No thyroid enlargement, no tenderness.  LUNGS: Normal breath sounds bilaterally, no wheezing, rales,rhonchi or crepitation. No use of accessory muscles of respiration.  CARDIOVASCULAR: S1, S2 normal. No murmurs, rubs, or gallops.  ABDOMEN: Soft, nontender, distended. Bowel sounds present. No organomegaly or mass.  EXTREMITIES: No pedal edema, cyanosis, or clubbing.  NEUROLOGIC: Lethargic but arousable and answers questions. PSYCHIATRIC: The patient is drowzy SKIN: No obvious rash, lesion, or ulcer.    LABORATORY PANEL:   CBC  Recent Labs Lab 12/30/16 0323  WBC 8.7  HGB 9.9*  HCT 31.8*  PLT 151   ------------------------------------------------------------------------------------------------------------------  Chemistries   Recent Labs Lab 12/29/16 2107 12/30/16 0323  NA  --  140  K  --  3.9  CL  --  98*  CO2  --  35*  GLUCOSE  --  121*  BUN  --  12  CREATININE  --  0.95  CALCIUM  --  8.9  AST 39  --   ALT 28  --   ALKPHOS 107  --   BILITOT 0.8  --     ------------------------------------------------------------------------------------------------------------------  Cardiac Enzymes No results for input(s): TROPONINI in the last 168 hours. ------------------------------------------------------------------------------------------------------------------  RADIOLOGY:  Dg Chest Portable 1 View  Result Date: 12/29/2016 CLINICAL DATA:  Acute onset of generalized weakness. Nausea. Initial encounter. EXAM: PORTABLE CHEST 1 VIEW COMPARISON:  Chest radiograph performed 12/17/2016 FINDINGS: The lungs are well-aerated. Mild vascular congestion is noted. Mild left basilar atelectasis is noted. There is no evidence of pleural effusion or pneumothorax. The cardiomediastinal silhouette is borderline normal in size. No acute osseous abnormalities are seen. IMPRESSION: Mild vascular congestion.  Mild left basilar atelectasis noted. Electronically Signed   By: Roanna Raider M.D.   On: 12/29/2016 22:44    EKG:   Orders placed or performed during the hospital encounter of 12/29/16  . ED EKG  . ED EKG    ASSESSMENT AND PLAN:    # Hepatic encephalopathy Continues to be drowsy in spite of her ammonia level trending down. Worsening due to noncompliance. On lactulose and Xifaxan. Needs 2-3 bowel movements a day.   #  acute hypoxic respiratory failure likely due to obesity hypoventilation. But also has mild vascular congestion in the chest due to fluid overload from cirrhosis. Recent echocardiogram showed normal ejection fraction. Diastolic function was normal. We'll start him on Lasix.  # Anemia of chronic disease is stable  # Alcohol abuse. On CIWA protocol.  All the records are reviewed and case discussed with Care Management/Social Worker Management plans discussed with the patient, family and they are in agreement.  CODE STATUS:  FULL CODE  TOTAL TIME TAKING  CARE OF THIS PATIENT: 36 minutes.   POSSIBLE D/C IN 1-2 DAYS, DEPENDING ON CLINICAL  CONDITION.  Note: This dictation was prepared with Dragon dictation along with smaller phrase technology. Any transcriptional errors that result from this process are unintentional.   Milagros Loll R M.D on 12/31/2016 at 2:18 PM  Between 7am to 6pm - Pager - [phone]  After 6pm go to [email or web address] - password [email or web address] [facility]  [facility]  Office  [phone]  CC: Primary care physician; Marisue Ivan, MD

## 2016-12-31 NOTE — Progress Notes (Signed)
Shift assessment completed at 0720. Pt sound asleep at that time, woke to voice, had some difficulty maintaining wakefulness, but woke when breakfast placed before him. Pt has o2 on at [facility], lungs clear bilat, Hr is regular, abdomen is distended, soft, bs hypoactive. Pt has urinal at bedside for prn use. Walker also at bedside. Ppp, slight non pitting edema noted to bilat lower legs. Pt denied pain. Pt's ativan held this am due to pt being drowsy, but he is alert and oriented. Since that time, pt continues to nap if given the opportunity. Dr.Sudini in on rounds. Call bell in reach.

## 2016-12-31 NOTE — Progress Notes (Signed)
Physical Therapy Treatment Patient Details Name: Howard [name] MRN: [id number] DOB: [birth year] Today's Date: 12/31/2016    History of Present Illness 36 y.o. male.  Pt recently discharged 3/26 and 4/1, now observation status with persistent seizures and hepatic encephalopathy. PMH: alcoholic cirrhosis of liver, anemia, COPD, depression, DM, htn, schizophrenia, and esophageal varices.    PT Comments    Pt is making good progress towards PT goals with ability to perform further gait and stair training this date. Pt very sleepy upon arrival and takes increased encouragement to participate. Pt is also somewhat unsteady during ambulation, needs cues for maintaining balance and rest breaks as needed. Would benefit from OP PT for further follow up for balance/endurance concerns. Would benefit from more practice on stairs.  Follow Up Recommendations  Outpatient PT     Equipment Recommendations  None recommended by PT    Recommendations for Other Services       Precautions / Restrictions Precautions Precautions: Fall Restrictions Weight Bearing Restrictions: No    Mobility  Bed Mobility Overal bed mobility: Needs Assistance Bed Mobility: Supine to Sit;Sit to Supine     Supine to sit: Min guard;HOB elevated     General bed mobility comments: Pt was sleeping upon arrival and groggy. Needed cga for bed mobility. Once seated at EOB, able to sit with upright posture.  Transfers Overall transfer level: Modified independent Equipment used: Rolling walker (2 wheeled) Transfers: Sit to/from Stand Sit to Stand: Modified independent (Device/Increase time)         General transfer comment: Pt able to rise from lower bed surface with upright posture noted. SLight unsteadiness with initial standing, no LOB noted  Ambulation/Gait Ambulation/Gait assistance: Modified independent (Device/Increase time) Ambulation Distance (Feet): 210 Feet Assistive device: Rolling walker (2 wheeled) Gait  Pattern/deviations: Step-through pattern Gait velocity: mildly decreased   General Gait Details: Pt able to ambulate in hallway with upright posture. Towards end of ambulation, pt fatigues, needed 1 seated rest break and 1 standing rest break. Pt begins to perform shuffling gait pattern when he fatigues.   Stairs Stairs: Yes   Stair Management: Two rails Number of Stairs: 4 General stair comments: Pt able to ambulate up/down 4 stairs using B railing and reciprocal gait. Pt reports his rails are too wide to reach at same time, however had difficulty only using 1 railing and defaulted to both railing.  Wheelchair Mobility    Modified Rankin (Stroke Patients Only)       Balance                                            Cognition Arousal/Alertness:  (sleepy) Behavior During Therapy: WFL for tasks assessed/performed Overall Cognitive Status: Within Functional Limits for tasks assessed                                        Exercises      General Comments        Pertinent Vitals/Pain Pain Assessment: No/denies pain    Home Living                      Prior Function            PT Goals (current goals can now be found in the care  plan section) Acute Rehab PT Goals Patient Stated Goal: be more active PT Goal Formulation: With patient Time For Goal Achievement: 01/06/17 Potential to Achieve Goals: Good Progress towards PT goals: Progressing toward goals    Frequency    Min 2X/week      PT Plan Current plan remains appropriate    Co-evaluation             End of Session Equipment Utilized During Treatment: Gait belt;Oxygen Activity Tolerance: Patient tolerated treatment well Patient left: in bed;with family/visitor present Nurse Communication: Mobility status PT Visit Diagnosis: Muscle weakness (generalized) (M62.81);History of falling (Z91.81);Difficulty in walking, not elsewhere classified (R26.2)      Time: 4098-1191 PT Time Calculation (min) (ACUTE ONLY): 16 min  Charges:  $Gait Training: 8-22 mins                    G Codes:       Elizabeth Palau, PT, DPT [phone]    Howard [name] 12/31/2016, 3:58 PM

## 2017-01-01 LAB — BASIC METABOLIC PANEL
Anion gap: 7 (ref 5–15)
BUN: 15 mg/dL (ref 6–20)
CHLORIDE: 93 mmol/L — AB (ref 101–111)
CO2: 37 mmol/L — AB (ref 22–32)
Calcium: 9.8 mg/dL (ref 8.9–10.3)
Creatinine, Ser: 1.05 mg/dL (ref 0.61–1.24)
GFR calc Af Amer: >60 mL/min (ref >60)
GFR calc non Af Amer: >60 mL/min (ref >60)
GLUCOSE: 143 mg/dL — AB (ref 65–99)
POTASSIUM: 3.8 mmol/L (ref 3.5–5.1)
Sodium: 137 mmol/L (ref 135–145)

## 2017-01-01 LAB — GLUCOSE, CAPILLARY: Glucose-Capillary: 113 mg/dL — ABNORMAL HIGH (ref 65–99)

## 2017-01-01 LAB — AMMONIA: Ammonia: 57 umol/L — ABNORMAL HIGH (ref 9–35)

## 2017-01-01 NOTE — Progress Notes (Signed)
Shift assessment completed at 0815. Assessment benign, pt alert and oriented, slight non pitting edema noted to pt's R leg. Since admission,. Dr.Sudini has rounded and discharged pt. Student nurse removed pt's piv from [facility] with catheter intact, d/c instructions briefly reviewed with pt and his mother, pt escorted to front entrance of facility via wc at 1100. Pt able to verbalize that he needs to take his lactulose at home, along with his regular medication.

## 2017-01-01 NOTE — Care Management Note (Signed)
Case Management Note  Patient Details  Name: Howard [name] MRN: [id number] Date of Birth: December 02, 1980  Subjective/Objective:    Discharging today               Action/Plan: [facility] notified of discharge   Expected Discharge Date:  01/01/17               Expected Discharge Plan:  Home w Home Health Services  In-House Referral:     Discharge planning Services  CM Consult  Post Acute Care Choice:  Home Health, Resumption of Svcs/PTA Provider Choice offered to:  Patient  DME Arranged:    DME Agency:     HH Arranged:  RN, PT, Social Work [employer] Agency:  [facility]  Status of Service:  Completed, signed off  If discussed at [employer], dates discussed:    Additional Comments:  Marily Memos, RN 01/01/2017, 10:44 AM

## 2017-01-01 NOTE — Plan of Care (Signed)
Problem: Bowel/Gastric: Goal: Will not experience complications related to bowel motility Outcome: Completed/Met Date Met: 01/01/17 Pt has met all goals for discharge.

## 2017-01-01 NOTE — Discharge Instructions (Signed)
Cardiac diet  Activity as tolerated  It is very important you take all medications as directed.

## 2017-01-01 NOTE — Discharge Summary (Signed)
[facility] - [place] at [facility]   PATIENT NAME: Howard [name]    MR#:  [id number]  DATE OF BIRTH:  07-10-1981  DATE OF ADMISSION:  12/29/2016 ADMITTING PHYSICIAN: Oralia Manis, MD  DATE OF DISCHARGE: 01/01/2017 11:00 AM  PRIMARY CARE PHYSICIAN: Marisue Ivan, MD   ADMISSION DIAGNOSIS:  Hepatic encephalopathy (HCC) [K72.90]  DISCHARGE DIAGNOSIS:  Principal Problem:   Hepatic encephalopathy (HCC) Active Problems:   Paranoid schizophrenia (HCC)   Hypertension   Alcohol abuse   Type 2 diabetes mellitus (HCC)   Controlled type 2 diabetes mellitus without complication (HCC)   SECONDARY DIAGNOSIS:   Past Medical History:  Diagnosis Date  . Alcoholic cirrhosis of liver with ascites (HCC)   . Anemia   . COPD (chronic obstructive pulmonary disease) (HCC)   . Depression   . Diabetes (HCC)   . Diabetes mellitus, type II (HCC)   . Esophageal varices (HCC)   . Heart disease   . Hyperlipemia   . Hypertension   . Liver disease   . Multiple thyroid nodules   . Portal hypertensive gastropathy   . Schizophrenia (HCC)   . Seizures (HCC)      ADMITTING HISTORY  HISTORY OF PRESENT ILLNESS:  Howard [name]  is a 36 y.o. male who presents with Weakness, slowed mentation. Patient has a known history of alcohol-induced cirrhosis, and often needs admission due to hyperammonemia induced hepatic encephalopathy due to missing lactulose doses. He admits to the same tonight. Hospitalists were called for admission.   HOSPITAL COURSE:   * Hepatic encephalopathy due to noncompliance with medications Patient had significantly elevated ammonia level at 90. He was restarted on his home dose of lactulose along with AcipHex). His ammonia trended down to 57. He does have chronic mildly elevated ammonia at baseline. Today he is back to his baseline with mental status. Alert and oriented 3. Counseled to be compliant with medications and target 3-4 bowel movements a day. He has  verbalized understanding. Patient is being discharged home. All his medications remain unchanged. He needs to be compliant with medications. He has had recurrent admissions for the same problem.  Other comorbidities remain the same. Patient continues to abuse alcohol. Was placed on CIWA protocol during the hospital stay.  Stable for discharge home.   CONSULTS OBTAINED:  Treatment Team:  Milagros Loll, MD  DRUG ALLERGIES:  No Known Allergies  DISCHARGE MEDICATIONS:   Discharge Medication List as of 01/01/2017 10:44 AM    CONTINUE these medications which have NOT CHANGED   Details  acamprosate (CAMPRAL) 333 MG tablet Take 2 tablets (666 mg total) by mouth 3 (three) times daily with meals., Starting Tue 08/19/2016, Normal    albuterol (PROVENTIL HFA;VENTOLIN HFA) 108 (90 Base) MCG/ACT inhaler Inhale 2 puffs into the lungs every 6 (six) hours as needed for wheezing or shortness of breath., Starting Wed 10/08/2016, Print    citalopram (CELEXA) 20 MG tablet Take 1 tablet (20 mg total) by mouth every morning., Starting Tue 08/19/2016, Normal    folic acid (FOLVITE) 1 MG tablet Take 1 tablet (1 mg total) by mouth daily., Starting Fri 12/19/2016, Print    lactulose (CHRONULAC) 10 GM/15ML solution Take 45 mLs (30 g total) by mouth every 4 (four) hours., Starting Wed 07/09/2016, Normal    levETIRAcetam (KEPPRA) 750 MG tablet Take 2 tablets (1,500 mg total) by mouth 2 (two) times daily., Starting Thu 12/11/2016, Until Mon 02/09/2017, Print    losartan (COZAAR) 25 MG tablet Take 25 mg  by mouth daily., Historical Med    metFORMIN (GLUCOPHAGE) 500 MG tablet Take 1 tablet (500 mg total) by mouth 2 (two) times daily with a meal., Starting Thu 05/15/2016, Print    Multiple Vitamin (MULTIVITAMIN WITH MINERALS) TABS tablet Take 1 tablet by mouth daily., Starting Sat 08/04/2015, Normal    nadolol (CORGARD) 20 MG tablet Take 20 mg by mouth daily., Historical Med    OLANZapine (ZYPREXA) 15 MG tablet Take  2 tablets (30 mg total) by mouth at bedtime., Starting Thu 05/22/2016, Normal    ondansetron (ZOFRAN) 4 MG tablet Take 1 tablet (4 mg total) by mouth every 8 (eight) hours as needed for nausea or vomiting., Starting Tue 08/26/2016, Print    rifaximin (XIFAXAN) 550 MG TABS tablet Take 1 tablet (550 mg total) by mouth 2 (two) times daily., Starting Wed 07/09/2016, Normal    spironolactone (ALDACTONE) 50 MG tablet Take 1 tablet (50 mg total) by mouth 2 (two) times daily., Starting Wed 07/09/2016, Normal    sucralfate (CARAFATE) 1 g tablet Take 1 g by mouth 4 (four) times daily., Historical Med    thiamine 100 MG tablet Take 1 tablet (100 mg total) by mouth daily., Starting Fri 12/19/2016, OTC    traZODone (DESYREL) 100 MG tablet Take 100 mg by mouth at bedtime as needed for sleep. , Historical Med        Today   VITAL SIGNS:  Blood pressure 115/77, pulse 90, temperature 98.3 F (36.8 C), temperature source Oral, resp. rate 16, height  (1.905 m), weight (!) 151.4 kg (333 lb 12.8 oz), SpO2 90 %.  I/O:   Intake/Output Summary (Last 24 hours) at 01/01/17 1403 Last data filed at 01/01/17 0930  Gross per 24 hour  Intake              540 ml  Output              300 ml  Net              240 ml    PHYSICAL EXAMINATION:  Physical Exam  GENERAL:  36 y.o.-year-old patient lying in the bed with no acute distress.  LUNGS: Normal breath sounds bilaterally, no wheezing, rales,rhonchi or crepitation. No use of accessory muscles of respiration.  CARDIOVASCULAR: S1, S2 normal. No murmurs, rubs, or gallops.  ABDOMEN: Soft, non-tender, non-distended. Bowel sounds present. No organomegaly or mass.  NEUROLOGIC: Moves all 4 extremities. PSYCHIATRIC: The patient is alert and oriented x 3.  SKIN: No obvious rash, lesion, or ulcer.   DATA REVIEW:   CBC  Recent Labs Lab 12/30/16 0323  WBC 8.7  HGB 9.9*  HCT 31.8*  PLT 151    Chemistries   Recent Labs Lab 12/29/16 2107  01/01/17 0412   NA  --   < > 137  K  --   < > 3.8  CL  --   < > 93*  CO2  --   < > 37*  GLUCOSE  --   < > 143*  BUN  --   < > 15  CREATININE  --   < > 1.05  CALCIUM  --   < > 9.8  AST 39  --   --   ALT 28  --   --   ALKPHOS 107  --   --   BILITOT 0.8  --   --   < > = values in this interval not displayed.  Cardiac Enzymes No results for input(s):  TROPONINI in the last 168 hours.  Microbiology Results  Results for orders placed or performed during the hospital encounter of 10/04/16  MRSA PCR Screening     Status: None   Collection Time: 10/05/16 11:00 PM  Result Value Ref Range Status   MRSA by PCR NEGATIVE NEGATIVE Final    Comment:        The GeneXpert MRSA Assay (FDA approved for NASAL specimens only), is one component of a comprehensive MRSA colonization surveillance program. It is not intended to diagnose MRSA infection nor to guide or monitor treatment for MRSA infections.     RADIOLOGY:  No results found.  Follow up with PCP in 1 week.  Management plans discussed with the patient, family and they are in agreement.  CODE STATUS:     Code Status Orders        Start     Ordered   12/30/16 0252  Full code  Continuous     12/30/16 0251    Code Status History    Date Active Date Inactive Code Status Order ID Comments User Context   12/18/2016 12:25 AM 12/19/2016  9:28 PM Full Code [id number]  Katharina Caper, MD Inpatient   12/12/2016 11:40 PM 12/15/2016  8:28 PM Full Code [id number]  Arnaldo Natal, MD Inpatient   11/24/2016  1:11 AM 11/25/2016  4:42 PM Full Code [id number]  Tonye Royalty, DO Inpatient   11/12/2016  3:36 PM 11/14/2016  2:36 PM Full Code [id number]  Auburn Bilberry, MD Inpatient   10/04/2016  6:04 PM 10/08/2016  3:44 PM Full Code [id number]  Shaune Pollack, MD Inpatient   09/22/2016  2:29 AM 09/23/2016  6:38 PM Full Code [id number]  Oralia Manis, MD Inpatient   08/07/2016  7:37 PM 08/12/2016  8:51 PM Full Code [id number]  Gracelyn Nurse, MD Inpatient   07/07/2016  1:40 AM  07/09/2016  6:29 PM Full Code [id number]  Ihor Austin, MD Inpatient   06/10/2016  6:21 AM 06/11/2016  8:25 PM Full Code [id number]  Ihor Austin, MD Inpatient   05/12/2016  3:55 AM 05/15/2016  2:57 PM Full Code [id number]  Ihor Austin, MD Inpatient   04/28/2016  2:54 AM 04/29/2016  6:23 PM Full Code [id number]  Alexis Hugelmeyer, DO Inpatient   03/14/2016 12:30 PM 03/16/2016  3:41 PM Full Code [id number]  Shaune Pollack, MD Inpatient   03/03/2016 12:13 PM 03/06/2016  2:26 PM Full Code [id number]  Milagros Loll, MD ED   02/10/2016  5:54 PM 02/12/2016  7:34 PM Full Code [id number]  Ramonita Lab, MD Inpatient   11/11/2015  5:16 AM 11/13/2015  6:00 PM Full Code [id number]  Ihor Austin, MD Inpatient   11/07/2015 10:02 PM 11/09/2015  4:56 PM Full Code [id number]  Enid Baas, MD Inpatient   10/28/2015  8:14 PM 10/29/2015  2:26 PM Full Code [id number]  Altamese Dilling, MD Inpatient   10/16/2015 11:47 PM 10/17/2015  6:00 PM Full Code [id number]  Oralia Manis, MD Inpatient   07/30/2015  5:35 PM 08/04/2015  8:34 PM Full Code [id number]  Enedina Finner, MD Inpatient   07/05/2015  1:16 AM 07/08/2015  7:22 PM Full Code [id number]  Oralia Manis, MD Inpatient   05/30/2015  7:28 PM 06/01/2015  3:36 PM Full Code [id number]  Audery Amel, MD Inpatient      TOTAL TIME TAKING CARE OF THIS PATIENT ON DAY OF DISCHARGE: more than 30 minutes.   Milagros Loll R M.D on 01/01/2017 at 2:03 PM  Between 7am to  6pm - Pager - [phone]  After 6pm go to [email or web address] - password [email or web address] [facility]  [facility] [place] [facility]  Office  [phone]  CC: Primary care physician; Marisue Ivan, MD  Note: This dictation was prepared with Dragon dictation along with smaller phrase technology. Any transcriptional errors that result from this process are unintentional.

## 2017-01-05 ENCOUNTER — Encounter: Payer: Self-pay | Admitting: *Deleted

## 2017-01-05 ENCOUNTER — Emergency Department
Admission: EM | Admit: 2017-01-05 | Discharge: 2017-01-05 | Disposition: A | Discharge status: Home / Self Care | Payer: Medicare Other | Attending: Emergency Medicine | Admitting: Emergency Medicine

## 2017-01-05 DIAGNOSIS — F200 Paranoid schizophrenia: Secondary | ICD-10-CM | POA: Diagnosis present

## 2017-01-05 DIAGNOSIS — I10 Essential (primary) hypertension: Secondary | ICD-10-CM | POA: Diagnosis not present

## 2017-01-05 DIAGNOSIS — E119 Type 2 diabetes mellitus without complications: Secondary | ICD-10-CM | POA: Insufficient documentation

## 2017-01-05 DIAGNOSIS — Z7984 Long term (current) use of oral hypoglycemic drugs: Secondary | ICD-10-CM | POA: Insufficient documentation

## 2017-01-05 DIAGNOSIS — Z79899 Other long term (current) drug therapy: Secondary | ICD-10-CM | POA: Diagnosis not present

## 2017-01-05 DIAGNOSIS — R443 Hallucinations, unspecified: Secondary | ICD-10-CM

## 2017-01-05 DIAGNOSIS — F1020 Alcohol dependence, uncomplicated: Secondary | ICD-10-CM | POA: Diagnosis present

## 2017-01-05 DIAGNOSIS — R440 Auditory hallucinations: Secondary | ICD-10-CM | POA: Diagnosis present

## 2017-01-05 LAB — COMPREHENSIVE METABOLIC PANEL
ALBUMIN: 3.7 g/dL (ref 3.5–5.0)
ALK PHOS: 110 U/L (ref 38–126)
ALT: 38 U/L (ref 17–63)
AST: 52 U/L — ABNORMAL HIGH (ref 15–41)
Anion gap: 8 (ref 5–15)
BUN: 14 mg/dL (ref 6–20)
CALCIUM: 9.2 mg/dL (ref 8.9–10.3)
CO2: 30 mmol/L (ref 22–32)
CREATININE: 0.94 mg/dL (ref 0.61–1.24)
Chloride: 100 mmol/L — ABNORMAL LOW (ref 101–111)
GFR calc Af Amer: >60 mL/min (ref >60)
GFR calc non Af Amer: >60 mL/min (ref >60)
GLUCOSE: 174 mg/dL — AB (ref 65–99)
Potassium: 4.2 mmol/L (ref 3.5–5.1)
SODIUM: 138 mmol/L (ref 135–145)
Total Bilirubin: 0.6 mg/dL (ref 0.3–1.2)
Total Protein: 8.5 g/dL — ABNORMAL HIGH (ref 6.5–8.1)

## 2017-01-05 LAB — URINE DRUG SCREEN, QUALITATIVE (ARMC ONLY)
Amphetamines, Ur Screen: NOT DETECTED
BARBITURATES, UR SCREEN: NOT DETECTED
Benzodiazepine, Ur Scrn: POSITIVE — AB
COCAINE METABOLITE, UR ~~LOC~~: NOT DETECTED
Cannabinoid 50 Ng, Ur ~~LOC~~: NOT DETECTED
MDMA (ECSTASY) UR SCREEN: NOT DETECTED
METHADONE SCREEN, URINE: NOT DETECTED
OPIATE, UR SCREEN: NOT DETECTED
Phencyclidine (PCP) Ur S: NOT DETECTED
Tricyclic, Ur Screen: NOT DETECTED

## 2017-01-05 LAB — CBC
HEMATOCRIT: 31.7 % — AB (ref 40.0–52.0)
HEMOGLOBIN: 9.9 g/dL — AB (ref 13.0–18.0)
MCH: 24.8 pg — AB (ref 26.0–34.0)
MCHC: 31.1 g/dL — ABNORMAL LOW (ref 32.0–36.0)
MCV: 79.8 fL — ABNORMAL LOW (ref 80.0–100.0)
Platelets: 118 10*3/uL — ABNORMAL LOW (ref 150–440)
RBC: 3.97 MIL/uL — ABNORMAL LOW (ref 4.40–5.90)
RDW: 20.4 % — ABNORMAL HIGH (ref 11.5–14.5)
WBC: 6.5 10*3/uL (ref 3.8–10.6)

## 2017-01-05 LAB — ACETAMINOPHEN LEVEL

## 2017-01-05 LAB — SALICYLATE LEVEL: Salicylate Lvl: <7 mg/dL (ref 2.8–30.0)

## 2017-01-05 LAB — AMMONIA: Ammonia: 54 umol/L — ABNORMAL HIGH (ref 9–35)

## 2017-01-05 LAB — ETHANOL: Alcohol, Ethyl (B): <5 mg/dL (ref <5)

## 2017-01-05 MED ORDER — LACTULOSE 10 GM/15ML PO SOLN
20.0000 g | Freq: Once | ORAL | Status: AC
  Administered 2017-01-05: 20 g via ORAL
  Filled 2017-01-05: qty 30

## 2017-01-05 MED ORDER — OLANZAPINE 20 MG PO TABS: 40.0000 mg | ORAL_TABLET | Freq: Every day | ORAL | 2 refills | Status: DC

## 2017-01-05 NOTE — ED Notes (Signed)
[facility]  PAPERS  RESCINDED  PER  DR  [name] 

## 2017-01-05 NOTE — Discharge Instructions (Signed)
Please follow the instructions that Dr.[name] gave you. Please return for any further problems. Change her medicines as he directed. Take an extra dose of lactulose tomorrow and the day after.

## 2017-01-05 NOTE — Consult Note (Signed)
[facility] Consult   Reason for Consult:  Consult for 36 year old man with history of schizophrenia who came to the emergency room because he cut his arm Referring Physician:  Cinda Quest Patient Identification: Howard [name] MRN:  [id number] Principal Diagnosis: Paranoid schizophrenia ([place]) Diagnosis:   Patient Active Problem List   Diagnosis Date Noted  . Diffuse abdominal pain [R10.84] 12/17/2016  . Respiratory failure with hypoxia ([place]) [J96.91] 12/14/2016  . Epilepsy ([place]) [D35.701] 12/12/2016  . GIB (gastrointestinal bleeding) [K92.2] 10/05/2016  . Ascites [R18.8] 10/04/2016  . DNR (do not resuscitate) discussion [Z71.89] 08/11/2016  . Palliative care by specialist [Z51.5] 08/11/2016  . Muscle weakness (generalized) [M62.81]   . Acute upper GI bleed [K92.2]   . GI bleed [K92.2] 08/07/2016  . Alcoholic cirrhosis of liver ([place]) [K70.30] 07/09/2016  . Acute respiratory failure with hypercapnia ([place]) [J96.02] 07/09/2016  . Hyperglycemia [R73.9] 07/09/2016  . Seizure ([place]) [R56.9] 06/10/2016  . Altered mental status [R41.82] 05/12/2016  . Headache [R51] 04/29/2016  . OSA (obstructive sleep apnea) [G47.33] 04/29/2016  . Anemia [D64.9] 04/29/2016  . Thrombocytopenia ([place]) [D69.6] 04/29/2016  . Coagulopathy ([place]) [D68.9] 04/29/2016  . Obesity [E66.9] 04/29/2016  . Generalized weakness [R53.1] 04/29/2016  . Acute hepatic encephalopathy [K72.00] 03/14/2016  . Controlled type 2 diabetes mellitus without complication ([place]) [X79.3] 01/15/2016  . Normocytic anemia [D64.9] 12/12/2015  . Essential (primary) hypertension [I10] 12/11/2015  . Pure hypercholesterolemia [E78.00] 12/11/2015  . Fever [R50.9] 11/11/2015  . Ascites due to alcoholic cirrhosis ([place]) [J03.00] 11/11/2015  . Cirrhosis of liver with ascites ([place]) [K74.60] 11/11/2015  . Hepatic encephalopathy ([place]) [K72.90] 10/16/2015  . Type 2 diabetes mellitus ([place]) [E11.9] 10/16/2015  . Acute renal failure (ARF) ([place])  [N17.9] 07/30/2015  . Alcoholic cirrhosis of liver with ascites ([place]) [K70.31] 07/19/2015  . Alcoholic cirrhosis ([place]) [P23.30] 07/19/2015  . Hypoxia [R09.02] 07/04/2015  . Elevated transaminase level [R74.0] 07/04/2015  . DOE (dyspnea on exertion) [R06.09] 07/04/2015  . Alcohol abuse [F10.10] 05/31/2015  . Hypertension [I10] 05/29/2015  . Paranoid schizophrenia ([place]) [F20.0] 03/20/2015    Total Time spent with patient: 1 hour  Subjective:   Howard [name] is a 36 y.o. male patient admitted with "the voices are worse".  HPI:  Patient interviewed. Chart reviewed. Patient well known to me as he is one of my regular outpatients. This 35 year old man with schizophrenia reports that for the last week his auditory hallucinations have been worse. He used to feel like he had them under some degree of control but not for the last week. They are not happening constantly but they are much more frequent. They tell him horrible things about himself. Last night he was feeling overwhelmed and he cut himself on the upper left arm. The cuts are not deep and he denies there was any intention to kill himself. He wasn't able to sleep well the last couple days. Patient has been compliant with his usual medicine although he is concerned because he says the medicines and his bubble packed don't look the same as what he was previously taking. He admits that he continues to drink alcohol and has a few beers several times a week but has certainly cut back from what he was taking before. He denies suicidal or homicidal ideation. He does not report any specific new stressor.  Medical history: Patient has cirrhosis which has developed over the past couple years because of his heavy drinking. He is overweight and has high blood pressure as well.  Social  history: Lives with his mother and brother. Disabled. Has good social support at home.  Substance abuse history: Patient over the past several years feel himself to be  drinking heavily. He had wrapped up to about 750 mL of vodka a day at one point. He has developed cirrhosis and has been in substance abuse treatment and has been able to cut back although he has not yet stopped completely. Does not use any other drugs.  Past Psychiatric History: Long history of schizophrenia. He has cut himself in the past although he has not actually tried to kill himself. Generally compliant with medicine and has not required hospitalization for his psychiatric illness in a long time. Takes olanzapine 30 mg a day which has generally been very effective.  Risk to Self: Suicidal Ideation: Yes-Currently Present Suicidal Intent: Yes-Currently Present Is patient at risk for suicide?: Yes Suicidal Plan?: Yes-Currently Present Specify Current Suicidal Plan: Cutting his arm Access to Means: Yes Specify Access to Suicidal Means: Anything sharp What has been your use of drugs/alcohol within the last 12 months?: None Reported How many times?: 0 Other Self Harm Risks: None Reported Triggers for Past Attempts: None known Intentional Self Injurious Behavior: Cutting Comment - Self Injurious Behavior: Pt cut himself in order to stop the voices Risk to Others: Homicidal Ideation: No Thoughts of Harm to Others: No Current Homicidal Intent: No Current Homicidal Plan: No Access to Homicidal Means: No Identified Victim: None Reported History of harm to others?: No Assessment of Violence: None Noted Violent Behavior Description: None Reported Does patient have access to weapons?: No Criminal Charges Pending?: No Does patient have a court date: No Prior Inpatient Therapy: Prior Inpatient Therapy: Yes Prior Therapy Dates: 2017 Prior Therapy Facilty/Provider(s): [facility] Reason for Treatment: Psychosis Prior Outpatient Therapy: Prior Outpatient Therapy: Yes Prior Therapy Dates: Ongoing Prior Therapy Facilty/Provider(s): Dr. Weber Cooks Reason for Treatment: Med Mgt Does patient have an ACCT  team?: No Does patient have Intensive In-House Services?  : No Does patient have [facility] services? : No Does patient have P4CC services?: No  Past Medical History:  Past Medical History:  Diagnosis Date  . Alcoholic cirrhosis of liver with ascites ([place])   . Anemia   . COPD (chronic obstructive pulmonary disease) ([place])   . Depression   . Diabetes ([place])   . Diabetes mellitus, type II ([place])   . Esophageal varices ([place])   . Heart disease   . Hyperlipemia   . Hypertension   . Liver disease   . Multiple thyroid nodules   . Portal hypertensive gastropathy ([place])   . Schizophrenia ([place])   . Seizures ([place])     Past Surgical History:  Procedure Laterality Date  . ESOPHAGOGASTRODUODENOSCOPY N/A 10/05/2015   Procedure: ESOPHAGOGASTRODUODENOSCOPY (EGD);  Surgeon: Josefine Class, MD;  Location: [facility] ENDOSCOPY;  Service: Endoscopy;  Laterality: N/A;  . ESOPHAGOGASTRODUODENOSCOPY (EGD) WITH PROPOFOL N/A 08/03/2015   Procedure: ESOPHAGOGASTRODUODENOSCOPY (EGD) WITH PROPOFOL;  Surgeon: Josefine Class, MD;  Location: [facility] ENDOSCOPY;  Service: Endoscopy;  Laterality: N/A;  . ESOPHAGOGASTRODUODENOSCOPY (EGD) WITH PROPOFOL N/A 08/31/2015   Procedure: ESOPHAGOGASTRODUODENOSCOPY (EGD) WITH PROPOFOL;  Surgeon: Josefine Class, MD;  Location: [facility] ENDOSCOPY;  Service: Endoscopy;  Laterality: N/A;  . ESOPHAGOGASTRODUODENOSCOPY (EGD) WITH PROPOFOL N/A 04/04/2016   Procedure: ESOPHAGOGASTRODUODENOSCOPY (EGD) WITH PROPOFOL;  Surgeon: Manya Silvas, MD;  Location: [facility] ENDOSCOPY;  Service: Endoscopy;  Laterality: N/A;  . ESOPHAGOGASTRODUODENOSCOPY (EGD) WITH PROPOFOL N/A 11/13/2016   Procedure: ESOPHAGOGASTRODUODENOSCOPY (EGD) WITH PROPOFOL;  Surgeon: Jonathon Bellows, MD;  Location: [facility] ENDOSCOPY;  Service: Endoscopy;  Laterality: N/A;  . NO PAST SURGERIES     Family History:  Family History  Problem Relation Age of Onset  . Heart disease Mother   . Hypertension Mother   . Hyperlipidemia Mother     . Stroke Father   . Heart attack Father   . Hypertension Father   . Heart disease Father   . Alcohol abuse Father   . Heart disease Brother    Family Psychiatric  History: Positive family history of schizophrenia Social History:  History  Alcohol Use  . 3.6 oz/week  . 6 Cans of beer per week     History  Drug Use No    Social History   Social History  . Marital status: Single    Spouse name: N/A  . Number of children: N/A  . Years of education: N/A   Occupational History  . disabled    Social History Main Topics  . Smoking status: Never Smoker  . Smokeless tobacco: Never Used  . Alcohol use 3.6 oz/week    6 Cans of beer per week  . Drug use: No  . Sexual activity: No   Other Topics Concern  . None   Social History Narrative   ** Merged History Encounter **       Additional Social History:    Allergies:  No Known Allergies  Labs:  Results for orders placed or performed during the hospital encounter of 01/05/17 (from the past 48 hour(s))  Comprehensive metabolic panel     Status: Abnormal   Collection Time: 01/05/17 12:09 PM  Result Value Ref Range   Sodium 138 135 - 145 mmol/L   Potassium 4.2 3.5 - 5.1 mmol/L   Chloride 100 (L) 101 - 111 mmol/L   CO2 30 22 - 32 mmol/L   Glucose, Bld 174 (H) 65 - 99 mg/dL   BUN 14 6 - 20 mg/dL   Creatinine, Ser 0.94 0.61 - 1.24 mg/dL   Calcium 9.2 8.9 - 10.3 mg/dL   Total Protein 8.5 (H) 6.5 - 8.1 g/dL   Albumin 3.7 3.5 - 5.0 g/dL   AST 52 (H) 15 - 41 U/L   ALT 38 17 - 63 U/L   Alkaline Phosphatase 110 38 - 126 U/L   Total Bilirubin 0.6 0.3 - 1.2 mg/dL   GFR calc non Af Amer >60 >60 mL/min   GFR calc Af Amer >60 >60 mL/min    Comment: (NOTE) The eGFR has been calculated using the CKD EPI equation. This calculation has not been validated in all clinical situations. eGFR's persistently <60 mL/min signify possible Chronic Kidney Disease.    Anion gap 8 5 - 15  Ethanol     Status: None   Collection Time:  01/05/17 12:09 PM  Result Value Ref Range   Alcohol, Ethyl (B) <5 <5 mg/dL    Comment:        LOWEST DETECTABLE LIMIT FOR SERUM ALCOHOL IS 5 mg/dL FOR MEDICAL PURPOSES ONLY   Salicylate level     Status: None   Collection Time: 01/05/17 12:09 PM  Result Value Ref Range   Salicylate Lvl <1.6 2.8 - 30.0 mg/dL  Acetaminophen level     Status: Abnormal   Collection Time: 01/05/17 12:09 PM  Result Value Ref Range   Acetaminophen (Tylenol), Serum <10 (L) 10 - 30 ug/mL    Comment:        THERAPEUTIC CONCENTRATIONS VARY SIGNIFICANTLY. A RANGE OF 10-30  ug/mL MAY BE AN EFFECTIVE CONCENTRATION FOR MANY PATIENTS. HOWEVER, SOME ARE BEST TREATED AT CONCENTRATIONS OUTSIDE THIS RANGE. ACETAMINOPHEN CONCENTRATIONS >150 ug/mL AT 4 HOURS AFTER INGESTION AND >50 ug/mL AT 12 HOURS AFTER INGESTION ARE OFTEN ASSOCIATED WITH TOXIC REACTIONS.   cbc     Status: Abnormal   Collection Time: 01/05/17 12:09 PM  Result Value Ref Range   WBC 6.5 3.8 - 10.6 K/uL   RBC 3.97 (L) 4.40 - 5.90 MIL/uL   Hemoglobin 9.9 (L) 13.0 - 18.0 g/dL   HCT 31.7 (L) 40.0 - 52.0 %   MCV 79.8 (L) 80.0 - 100.0 fL   MCH 24.8 (L) 26.0 - 34.0 pg   MCHC 31.1 (L) 32.0 - 36.0 g/dL   RDW 20.4 (H) 11.5 - 14.5 %   Platelets 118 (L) 150 - 440 K/uL  Urine Drug Screen, Qualitative     Status: Abnormal   Collection Time: 01/05/17 12:09 PM  Result Value Ref Range   Tricyclic, Ur Screen NONE DETECTED NONE DETECTED   Amphetamines, Ur Screen NONE DETECTED NONE DETECTED   MDMA (Ecstasy)Ur Screen NONE DETECTED NONE DETECTED   Cocaine Metabolite,Ur [place] NONE DETECTED NONE DETECTED   Opiate, Ur Screen NONE DETECTED NONE DETECTED   Phencyclidine (PCP) Ur S NONE DETECTED NONE DETECTED   Cannabinoid 50 Ng, Ur [place] NONE DETECTED NONE DETECTED   Barbiturates, Ur Screen NONE DETECTED NONE DETECTED   Benzodiazepine, Ur Scrn POSITIVE (A) NONE DETECTED   Methadone Scn, Ur NONE DETECTED NONE DETECTED    Comment: (NOTE) 326  Tricyclics, urine                Cutoff 1000 ng/mL 200  Amphetamines, urine             Cutoff 1000 ng/mL 300  MDMA (Ecstasy), urine           Cutoff 500 ng/mL 400  Cocaine Metabolite, urine       Cutoff 300 ng/mL 500  Opiate, urine                   Cutoff 300 ng/mL 600  Phencyclidine (PCP), urine      Cutoff 25 ng/mL 700  Cannabinoid, urine              Cutoff 50 ng/mL 800  Barbiturates, urine             Cutoff 200 ng/mL 900  Benzodiazepine, urine           Cutoff 200 ng/mL 1000 Methadone, urine                Cutoff 300 ng/mL 1100 1200 The urine drug screen provides only a preliminary, unconfirmed 1300 analytical test result and should not be used for non-medical 1400 purposes. Clinical consideration and professional judgment should 1500 be applied to any positive drug screen result due to possible 1600 interfering substances. A more specific alternate chemical method 1700 must be used in order to obtain a confirmed analytical result.  1800 Gas chromato graphy / mass spectrometry (GC/MS) is the preferred 1900 confirmatory method.   Ammonia     Status: Abnormal   Collection Time: 01/05/17  3:15 PM  Result Value Ref Range   Ammonia 54 (H) 9 - 35 umol/L    No current facility-administered medications for this encounter.    Current Outpatient Prescriptions  Medication Sig Dispense Refill  . acamprosate (CAMPRAL) 333 MG tablet Take 2 tablets (666 mg total) by mouth 3 (three) times  daily with meals. 180 tablet 5  . albuterol (PROVENTIL HFA;VENTOLIN HFA) 108 (90 Base) MCG/ACT inhaler Inhale 2 puffs into the lungs every 6 (six) hours as needed for wheezing or shortness of breath. 1 Inhaler 2  . citalopram (CELEXA) 20 MG tablet Take 1 tablet (20 mg total) by mouth every morning. 30 tablet 5  . folic acid (FOLVITE) 1 MG tablet Take 1 tablet (1 mg total) by mouth daily. 30 tablet 0  . lactulose (CHRONULAC) 10 GM/15ML solution Take 45 mLs (30 g total) by mouth every 4 (four) hours. (Patient taking differently: Take  30 g by mouth 4 (four) times daily. ) 240 mL 0  . levETIRAcetam (KEPPRA) 750 MG tablet Take 2 tablets (1,500 mg total) by mouth 2 (two) times daily. 120 tablet 0  . losartan (COZAAR) 25 MG tablet Take 25 mg by mouth daily.    . metFORMIN (GLUCOPHAGE) 500 MG tablet Take 1 tablet (500 mg total) by mouth 2 (two) times daily with a meal. 60 tablet 0  . Multiple Vitamin (MULTIVITAMIN WITH MINERALS) TABS tablet Take 1 tablet by mouth daily. 30 tablet 0  . nadolol (CORGARD) 20 MG tablet Take 20 mg by mouth daily.    Marland Kitchen OLANZapine (ZYPREXA) 15 MG tablet Take 2 tablets (30 mg total) by mouth at bedtime. 60 tablet 5  . OLANZapine (ZYPREXA) 20 MG tablet Take 2 tablets (40 mg total) by mouth at bedtime. 60 tablet 2  . ondansetron (ZOFRAN) 4 MG tablet Take 1 tablet (4 mg total) by mouth every 8 (eight) hours as needed for nausea or vomiting. 10 tablet 0  . rifaximin (XIFAXAN) 550 MG TABS tablet Take 1 tablet (550 mg total) by mouth 2 (two) times daily. 60 tablet 5  . spironolactone (ALDACTONE) 50 MG tablet Take 1 tablet (50 mg total) by mouth 2 (two) times daily. 60 tablet 5  . sucralfate (CARAFATE) 1 g tablet Take 1 g by mouth 4 (four) times daily.    Marland Kitchen thiamine 100 MG tablet Take 1 tablet (100 mg total) by mouth daily.    . traZODone (DESYREL) 100 MG tablet Take 100 mg by mouth at bedtime as needed for sleep.       Musculoskeletal: Strength & Muscle Tone: within normal limits Gait & Station: normal Patient leans: N/A  Psychiatric Specialty Exam: Physical Exam  Nursing note and vitals reviewed. Constitutional: He appears well-developed and well-nourished.  HENT:  Head: Normocephalic and atraumatic.  Eyes: Conjunctivae are normal. Pupils are equal, round, and reactive to light.  Neck: Normal range of motion.  Cardiovascular: Regular rhythm and normal heart sounds.   Respiratory: Effort normal. No respiratory distress.  GI: Soft.  Musculoskeletal: Normal range of motion.  Neurological: He is alert.   Skin: Skin is warm and dry.     Psychiatric: Judgment normal. His affect is blunt. His speech is delayed. He is slowed. Cognition and memory are normal. He expresses no homicidal and no suicidal ideation.    Review of Systems  Constitutional: Negative.   HENT: Negative.   Eyes: Negative.   Respiratory: Negative.   Cardiovascular: Negative.   Gastrointestinal: Negative.   Musculoskeletal: Negative.   Skin: Negative.   Neurological: Negative.   Psychiatric/Behavioral: Positive for hallucinations and substance abuse. Negative for depression, memory loss and suicidal ideas. The patient is nervous/anxious and has insomnia.     Blood pressure (!) 150/98, pulse 79, temperature 98.6 F (37 C), temperature source Oral, resp. rate 20, height _0  (1.905 m),  weight (!) 151 kg (333 lb), SpO2 94 %.Body mass index is 41.62 kg/m.  General Appearance: Casual  Eye Contact:  Good  Speech:  Slow  Volume:  Decreased  Mood:  Anxious  Affect:  Blunt  Thought Process:  Goal Directed  Orientation:  Full (Time, Place, and Person)  Thought Content:  Logical  Suicidal Thoughts:  No  Homicidal Thoughts:  No  Memory:  Immediate;   Good Recent;   Fair Remote;   Fair  Judgement:  Fair  Insight:  Good  Psychomotor Activity:  Decreased  Concentration:  Concentration: Fair  Recall:  [employer] of Knowledge:  Fair  Language:  Good  Akathisia:  No  Handed:  Right  AIMS (if indicated):     Assets:  Communication Skills Desire for Improvement Financial Resources/Insurance Housing Social Support  ADL's:  Intact  Cognition:  Impaired,  Mild  Sleep:        Treatment Plan Summary: Medication management and Plan 36 year old man with schizophrenia also with cirrhosis. Recent worsening of his hallucinations. Notably on his labs today his ammonia level is elevated into the mid 50s. Doesn't appear to be delirious but this could also be a contributing factor to his hallucinations. Patient is denying  suicidal ideation or intent and in my experience is generally very reliable and trustworthy about treatment. I have suggested that we increase his olanzapine dose to 40 mg a day total. He is to call and make an appointment to see me probably getting in a week to my outpatient office and knows he can contact me earlier if needed. Discontinue IVC. Case reviewed with emergency room physician. Patient does not require hospitalization at this point.  Disposition: Patient does not meet criteria for psychiatric inpatient admission. Supportive therapy provided about ongoing stressors.  Alethia Berthold, MD 01/05/2017 6:03 PM

## 2017-01-05 NOTE — ED Notes (Signed)
PT IVC PENDING CONSULT  

## 2017-01-05 NOTE — BH Assessment (Addendum)
Assessment Note  Howard [name] is an 36 y.o. male that reports hearing voices.  Patient denies SI/HI/Psychosis/Substance Abuse.  Patient reports that his voices are getting louder and he cut his arm in an attempt to make the voices stop.   Howard [name] is a patient with Dr. Sandria Senter and receives psychiatric medication management.  Patient reports that he will be able to follow up with his psychiatrist Dr,. Clapac.   Diagnosis: Schizophrenia  Past Medical History:  Past Medical History:  Diagnosis Date  . Alcoholic cirrhosis of liver with ascites (HCC)   . Anemia   . COPD (chronic obstructive pulmonary disease) (HCC)   . Depression   . Diabetes (HCC)   . Diabetes mellitus, type II (HCC)   . Esophageal varices (HCC)   . Heart disease   . Hyperlipemia   . Hypertension   . Liver disease   . Multiple thyroid nodules   . Portal hypertensive gastropathy (HCC)   . Schizophrenia (HCC)   . Seizures (HCC)     Past Surgical History:  Procedure Laterality Date  . ESOPHAGOGASTRODUODENOSCOPY N/A 10/05/2015   Procedure: ESOPHAGOGASTRODUODENOSCOPY (EGD);  Surgeon: Elnita Maxwell, MD;  Location: [facility] ENDOSCOPY;  Service: Endoscopy;  Laterality: N/A;  . ESOPHAGOGASTRODUODENOSCOPY (EGD) WITH PROPOFOL N/A 08/03/2015   Procedure: ESOPHAGOGASTRODUODENOSCOPY (EGD) WITH PROPOFOL;  Surgeon: Elnita Maxwell, MD;  Location: [facility] ENDOSCOPY;  Service: Endoscopy;  Laterality: N/A;  . ESOPHAGOGASTRODUODENOSCOPY (EGD) WITH PROPOFOL N/A 08/31/2015   Procedure: ESOPHAGOGASTRODUODENOSCOPY (EGD) WITH PROPOFOL;  Surgeon: Elnita Maxwell, MD;  Location: [facility] ENDOSCOPY;  Service: Endoscopy;  Laterality: N/A;  . ESOPHAGOGASTRODUODENOSCOPY (EGD) WITH PROPOFOL N/A 04/04/2016   Procedure: ESOPHAGOGASTRODUODENOSCOPY (EGD) WITH PROPOFOL;  Surgeon: Scot Jun, MD;  Location: [facility] ENDOSCOPY;  Service: Endoscopy;  Laterality: N/A;  . ESOPHAGOGASTRODUODENOSCOPY (EGD) WITH PROPOFOL N/A 11/13/2016   Procedure:  ESOPHAGOGASTRODUODENOSCOPY (EGD) WITH PROPOFOL;  Surgeon: Wyline Mood, MD;  Location: [facility] ENDOSCOPY;  Service: Endoscopy;  Laterality: N/A;  . NO PAST SURGERIES      Family History:  Family History  Problem Relation Age of Onset  . Heart disease Mother   . Hypertension Mother   . Hyperlipidemia Mother   . Stroke Father   . Heart attack Father   . Hypertension Father   . Heart disease Father   . Alcohol abuse Father   . Heart disease Brother     Social History:  reports that he has never smoked. He has never used smokeless tobacco. He reports that he drinks about 3.6 oz of alcohol per week . He reports that he does not use drugs.  Additional Social History:  Alcohol / Drug Use History of alcohol / drug use?: No history of alcohol / drug abuse  CIWA: CIWA-Ar BP: 130/76 Pulse Rate: 81 COWS:    Allergies: No Known Allergies  Home Medications:  (Not in a hospital admission)  OB/GYN Status:  No LMP for male patient.  General Assessment Data Location of Assessment: [facility] ED TTS Assessment: In system Is this a Tele or Face-to-Face Assessment?: Face-to-Face Is this an Initial Assessment or a Re-assessment for this encounter?: Initial Assessment Marital status: Single Maiden name: NA Is patient pregnant?: No Pregnancy Status: No Living Arrangements:  (Lives with his mother) Can pt return to current living arrangement?: Yes Admission Status: Involuntary Is patient capable of signing voluntary admission?: No Referral Source: Self/Family/Friend Insurance type: Medicaid  Medical Screening Exam [facility] Walk-in ONLY) Medical Exam completed:  (NA)  Crisis Care Plan Living Arrangements:  (Lives with  his mother) Legal Guardian:  (NA) Name of Psychiatrist: [name] Name of Therapist: None Reported  Education Status Is patient currently in school?: No Current Grade: NA Highest grade of school patient has completed: NA Name of school: NA Contact person: NA  Risk to self  with the past 6 months Suicidal Ideation: Yes-Currently Present Has patient been a risk to self within the past 6 months prior to admission? : Yes Suicidal Intent: Yes-Currently Present Has patient had any suicidal intent within the past 6 months prior to admission? : Yes Is patient at risk for suicide?: Yes Suicidal Plan?: Yes-Currently Present Has patient had any suicidal plan within the past 6 months prior to admission? : Yes Specify Current Suicidal Plan: Cutting his arm Access to Means: Yes Specify Access to Suicidal Means: Anything sharp What has been your use of drugs/alcohol within the last 12 months?: None Reported Previous Attempts/Gestures: No How many times?: 0 Other Self Harm Risks: None Reported Triggers for Past Attempts: None known Intentional Self Injurious Behavior: Cutting Comment - Self Injurious Behavior: Pt cut himself in order to stop the voices Family Suicide History: No Recent stressful life event(s): Other (Comment) (Command voices ) Persecutory voices/beliefs?: Yes Depression: Yes Depression Symptoms: Fatigue, Loss of interest in usual pleasures, Feeling worthless/self pity, Guilt Substance abuse history and/or treatment for substance abuse?: No Suicide prevention information given to non-admitted patients: Yes  Risk to Others within the past 6 months Homicidal Ideation: No Does patient have any lifetime risk of violence toward others beyond the six months prior to admission? : No Thoughts of Harm to Others: No Current Homicidal Intent: No Current Homicidal Plan: No Access to Homicidal Means: No Identified Victim: None Reported History of harm to others?: No Assessment of Violence: None Noted Violent Behavior Description: None Reported Does patient have access to weapons?: No Criminal Charges Pending?: No Does patient have a court date: No Is patient on probation?: No  Psychosis Hallucinations: Auditory (Denies visual hallucinations) Delusions:  None noted  Mental Status Report Appearance/Hygiene: Disheveled, In scrubs Eye Contact: Good Motor Activity: Freedom of movement, Restlessness Speech: Logical/coherent Level of Consciousness: Alert Mood: Depressed, Suspicious Affect: Appropriate to circumstance Anxiety Level: None Thought Processes: Coherent, Relevant Judgement: Impaired Orientation: Person, Place, Time, Situation Obsessive Compulsive Thoughts/Behaviors: None  Cognitive Functioning Concentration: Decreased Memory: Recent Intact, Remote Intact IQ: Average Insight: Poor Impulse Control: Poor Appetite: Good Weight Loss: 0 Weight Gain: 0 Sleep: No Change Total Hours of Sleep: 8 Vegetative Symptoms: None  ADLScreening [facility] Assessment Services) Patient's cognitive ability adequate to safely complete daily activities?: Yes Patient able to express need for assistance with ADLs?: Yes Independently performs ADLs?: Yes (appropriate for developmental age)  Prior Inpatient Therapy Prior Inpatient Therapy: Yes Prior Therapy Dates: 2017 Prior Therapy Facilty/Provider(s): [facility] Reason for Treatment: Psychosis  Prior Outpatient Therapy Prior Outpatient Therapy: Yes Prior Therapy Dates: Ongoing Prior Therapy Facilty/Provider(s): [name] Reason for Treatment: Med Mgt Does patient have an ACCT team?: No Does patient have Intensive In-House Services?  : No Does patient have [facility] services? : No Does patient have P4CC services?: No  ADL Screening (condition at time of admission) Patient's cognitive ability adequate to safely complete daily activities?: Yes Is the patient deaf or have difficulty hearing?: No Does the patient have difficulty seeing, even when wearing glasses/contacts?: No Does the patient have difficulty concentrating, remembering, or making decisions?: Yes Patient able to express need for assistance with ADLs?: Yes Does the patient have difficulty dressing or bathing?: No Independently  performs  ADLs?: Yes (appropriate for developmental age) Does the patient have difficulty walking or climbing stairs?: No Weakness of Legs: None Weakness of Arms/Hands: None  Home Assistive Devices/Equipment Home Assistive Devices/Equipment: None    Abuse/Neglect Assessment (Assessment to be complete while patient is alone) Physical Abuse: Denies Verbal Abuse: Denies Sexual Abuse: Denies Exploitation of patient/patient's resources: Denies Self-Neglect: Denies Values / Beliefs Cultural Requests During Hospitalization: None Spiritual Requests During Hospitalization: None Consults Spiritual Care Consult Needed: No Social Work Consult Needed: No Merchant navy officer (For Healthcare) Does Patient Have a Medical Advance Directive?: No Would patient like information on creating a medical advance directive?: No - Patient declined    Additional Information 1:1 In Past 12 Months?: No CIRT Risk: No Elopement Risk: No Does patient have medical clearance?: Yes     Disposition: Per [name] - patient does not meet criteria for inpatient hospitalization.  Disposition Initial Assessment Completed for this Encounter: Yes  On Site Evaluation by:   Reviewed with Physician:    Phillip Heal LaVerne 01/05/2017 4:24 PM

## 2017-01-05 NOTE — ED Triage Notes (Addendum)
Pt arrives with complaints of hearing voices telling him to hurt himself, pt has abrasions on both upper arms from self inflicted behavior, states he did not cut himself to kill himself but it "helps", states he does not want to hurt himself but the voices wont stop, states hx of schizophrenia

## 2017-01-05 NOTE — ED Notes (Signed)
Per pt, self inflected superfical cuts to bilateral upper arms. Cuts are not bleeding and in various stages of healing. No dressing needed at this time.

## 2017-01-05 NOTE — ED Provider Notes (Signed)
[facility] Emergency Department Provider Note   ____________________________________________   First MD Initiated Contact with Patient 01/05/17 1501     (approximate)  I have reviewed the triage vital signs and the nursing notes.   HISTORY  Chief Complaint Hallucinations   HPI Howard [name] is a 36 y.o. male who reports he is schizophrenic. He reports he is taking all of his medicines. He reports pharmacy vitamin blister pack and takes one blister pack a day. He reports he is still hearing voices. These voices have been getting ladder latter for several weeks. They're currently so well that he cannot sleep anymore. He is now cutting himself because of the voices. The voices are telling him his stupid lazy and useless. He told his mother and his mother brought him in here.   Past Medical History:  Diagnosis Date  . Alcoholic cirrhosis of liver with ascites (HCC)   . Anemia   . COPD (chronic obstructive pulmonary disease) (HCC)   . Depression   . Diabetes (HCC)   . Diabetes mellitus, type II (HCC)   . Esophageal varices (HCC)   . Heart disease   . Hyperlipemia   . Hypertension   . Liver disease   . Multiple thyroid nodules   . Portal hypertensive gastropathy (HCC)   . Schizophrenia (HCC)   . Seizures [facility])     Patient Active Problem List   Diagnosis Date Noted  . Diffuse abdominal pain 12/17/2016  . Respiratory failure with hypoxia (HCC) 12/14/2016  . Epilepsy (HCC) 12/12/2016  . GIB (gastrointestinal bleeding) 10/05/2016  . Ascites 10/04/2016  . DNR (do not resuscitate) discussion 08/11/2016  . Palliative care by specialist 08/11/2016  . Muscle weakness (generalized)   . Acute upper GI bleed   . GI bleed 08/07/2016  . Alcoholic cirrhosis of liver (HCC) 07/09/2016  . Acute respiratory failure with hypercapnia (HCC) 07/09/2016  . Hyperglycemia 07/09/2016  . Seizure (HCC) 06/10/2016  . Altered mental status 05/12/2016  . Headache  04/29/2016  . OSA (obstructive sleep apnea) 04/29/2016  . Anemia 04/29/2016  . Thrombocytopenia (HCC) 04/29/2016  . Coagulopathy (HCC) 04/29/2016  . Obesity 04/29/2016  . Generalized weakness 04/29/2016  . Acute hepatic encephalopathy 03/14/2016  . Controlled type 2 diabetes mellitus without complication (HCC) 01/15/2016  . Normocytic anemia 12/12/2015  . Essential (primary) hypertension 12/11/2015  . Pure hypercholesterolemia 12/11/2015  . Fever 11/11/2015  . Ascites due to alcoholic cirrhosis (HCC) 11/11/2015  . Cirrhosis of liver with ascites (HCC) 11/11/2015  . Hepatic encephalopathy (HCC) 10/16/2015  . Type 2 diabetes mellitus (HCC) 10/16/2015  . Acute renal failure (ARF) (HCC) 07/30/2015  . 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 Alcoholic cirrhosis (HCC) 07/19/2015  . Hypoxia 07/04/2015  . Elevated transaminase level 07/04/2015  . DOE (dyspnea on exertion) 07/04/2015  . Alcohol abuse 05/31/2015  . Hypertension 05/29/2015  . Paranoid schizophrenia (HCC) 03/20/2015    Past Surgical History:  Procedure Laterality Date  . ESOPHAGOGASTRODUODENOSCOPY N/A 10/05/2015   Procedure: ESOPHAGOGASTRODUODENOSCOPY (EGD);  Surgeon: Elnita Maxwell, MD;  Location: [facility] ENDOSCOPY;  Service: Endoscopy;  Laterality: N/A;  . ESOPHAGOGASTRODUODENOSCOPY (EGD) WITH PROPOFOL N/A 08/03/2015   Procedure: ESOPHAGOGASTRODUODENOSCOPY (EGD) WITH PROPOFOL;  Surgeon: Elnita Maxwell, MD;  Location: [facility] ENDOSCOPY;  Service: Endoscopy;  Laterality: N/A;  . ESOPHAGOGASTRODUODENOSCOPY (EGD) WITH PROPOFOL N/A 08/31/2015   Procedure: ESOPHAGOGASTRODUODENOSCOPY (EGD) WITH PROPOFOL;  Surgeon: Elnita Maxwell, MD;  Location: [facility] ENDOSCOPY;  Service: Endoscopy;  Laterality: N/A;  .  ESOPHAGOGASTRODUODENOSCOPY (EGD) WITH PROPOFOL N/A 04/04/2016   Procedure: ESOPHAGOGASTRODUODENOSCOPY (EGD) WITH PROPOFOL;  Surgeon: Scot Jun, MD;  Location: [facility] ENDOSCOPY;  Service: Endoscopy;   Laterality: N/A;  . ESOPHAGOGASTRODUODENOSCOPY (EGD) WITH PROPOFOL N/A 11/13/2016   Procedure: ESOPHAGOGASTRODUODENOSCOPY (EGD) WITH PROPOFOL;  Surgeon: Wyline Mood, MD;  Location: [facility] ENDOSCOPY;  Service: Endoscopy;  Laterality: N/A;  . NO PAST SURGERIES      Prior to Admission medications   Medication Sig Start Date End Date Taking? Authorizing Provider  acamprosate (CAMPRAL) 333 MG tablet Take 2 tablets (666 mg total) by mouth 3 (three) times daily with meals. 08/19/16   Audery Amel, MD  albuterol (PROVENTIL HFA;VENTOLIN HFA) 108 (90 Base) MCG/ACT inhaler Inhale 2 puffs into the lungs every 6 (six) hours as needed for wheezing or shortness of breath. 10/08/16   Shaune Pollack, MD  citalopram (CELEXA) 20 MG tablet Take 1 tablet (20 mg total) by mouth every morning. 08/19/16   Audery Amel, MD  folic acid (FOLVITE) 1 MG tablet Take 1 tablet (1 mg total) by mouth daily. 12/19/16   Ramonita Lab, MD  lactulose (CHRONULAC) 10 GM/15ML solution Take 45 mLs (30 g total) by mouth every 4 (four) hours. Patient taking differently: Take 30 g by mouth 4 (four) times daily.  07/09/16   Katharina Caper, MD  levETIRAcetam (KEPPRA) 750 MG tablet Take 2 tablets (1,500 mg total) by mouth 2 (two) times daily. 12/11/16 02/09/17  Merrily Brittle, MD  losartan (COZAAR) 25 MG tablet Take 25 mg by mouth daily.    Historical Provider, MD  metFORMIN (GLUCOPHAGE) 500 MG tablet Take 1 tablet (500 mg total) by mouth 2 (two) times daily with a meal. 05/15/16   Alford Highland, MD  Multiple Vitamin (MULTIVITAMIN WITH MINERALS) TABS tablet Take 1 tablet by mouth daily. 08/04/15   Ramonita Lab, MD  nadolol (CORGARD) 20 MG tablet Take 20 mg by mouth daily.    Historical Provider, MD  OLANZapine (ZYPREXA) 15 MG tablet Take 2 tablets (30 mg total) by mouth at bedtime. 05/22/16   Audery Amel, MD  ondansetron (ZOFRAN) 4 MG tablet Take 1 tablet (4 mg total) by mouth every 8 (eight) hours as needed for nausea or vomiting. 08/26/16   Governor Rooks, MD  rifaximin (XIFAXAN) 550 MG TABS tablet Take 1 tablet (550 mg total) by mouth 2 (two) times daily. 07/09/16   Katharina Caper, MD  spironolactone (ALDACTONE) 50 MG tablet Take 1 tablet (50 mg total) by mouth 2 (two) times daily. 07/09/16   Katharina Caper, MD  sucralfate (CARAFATE) 1 g tablet Take 1 g by mouth 4 (four) times daily.    Historical Provider, MD  thiamine 100 MG tablet Take 1 tablet (100 mg total) by mouth daily. 12/19/16   Ramonita Lab, MD  traZODone (DESYREL) 100 MG tablet Take 100 mg by mouth at bedtime as needed for sleep.     Historical Provider, MD    Allergies Patient has no known allergies.  Family History  Problem Relation Age of Onset  . Heart disease Mother   . Hypertension Mother   . Hyperlipidemia Mother   . Stroke Father   . Heart attack Father   . Hypertension Father   . Heart disease Father   . Alcohol abuse Father   . Heart disease Brother     Social History Social History  Substance Use Topics  . Smoking status: Never Smoker  . Smokeless tobacco: Never Used  . Alcohol use 3.6 oz/week  6 Cans of beer per week    Review of Systems Constitutional: No fever/chills Eyes: No visual changes. ENT: No sore throat. Cardiovascular: Denies chest pain. Respiratory: Denies shortness of breath. Gastrointestinal: No abdominal pain.  No nausea, no vomiting.  No diarrhea.  No constipation. Genitourinary: Negative for dysuria. Musculoskeletal: Negative for back pain. Skin: Negative for rash.  10-point ROS otherwise negative.  ____________________________________________   PHYSICAL EXAM:  VITAL SIGNS: ED Triage Vitals  Enc Vitals Group     BP 01/05/17 1211 130/76     Pulse Rate 01/05/17 1211 81     Resp 01/05/17 1211 18     Temp 01/05/17 1211 98.9 F (37.2 C)     Temp Source 01/05/17 1211 Oral     SpO2 01/05/17 1211 92 %     Weight 01/05/17 1208 (!) 333 lb (151 kg)     Height 01/05/17 1208  (1.905 m)     Head Circumference --       Peak Flow --      Pain Score 01/05/17 1208 0     Pain Loc --      Pain Edu? --      Excl. in GC? --     Constitutional: Alert and oriented. Well appearing and in no acute distress. Eyes: Conjunctivae are normal. PERRL. EOMI. Head: Atraumatic. Nose: No congestion/rhinnorhea. Mouth/Throat: Mucous membranes are moist.  Oropharynx non-erythematous. Neck: No stridor. Cardiovascular: Normal rate, regular rhythm. Grossly normal heart sounds.  Good peripheral circulation. Respiratory: Normal respiratory effort.  No retractions. Lungs CTAB. Gastrointestinal: Soft and nontender. No distention. No abdominal bruits. No CVA tenderness. Musculoskeletal: No lower extremity tenderness nor edema.  No joint effusions. Neurologic:  Normal speech and language. No gross focal neurologic deficits are appreciated. No gait instability.  Skin:  Skin is warm, dry and intact. No rash noted.   ____________________________________________   LABS (all labs ordered are listed, but only abnormal results are displayed)  Labs Reviewed  COMPREHENSIVE METABOLIC PANEL - Abnormal; Notable for the following:       Result Value   Chloride 100 (*)    Glucose, Bld 174 (*)    Total Protein 8.5 (*)    AST 52 (*)    All other components within normal limits  ACETAMINOPHEN LEVEL - Abnormal; Notable for the following:    Acetaminophen (Tylenol), Serum <10 (*)    All other components within normal limits  CBC - Abnormal; Notable for the following:    RBC 3.97 (*)    Hemoglobin 9.9 (*)    HCT 31.7 (*)    MCV 79.8 (*)    MCH 24.8 (*)    MCHC 31.1 (*)    RDW 20.4 (*)    Platelets 118 (*)    All other components within normal limits  URINE DRUG SCREEN, QUALITATIVE ([facility] ONLY) - Abnormal; Notable for the following:    Benzodiazepine, Ur Scrn POSITIVE (*)    All other components within normal limits  ETHANOL  SALICYLATE LEVEL  AMMONIA    ____________________________________________  EKG   ____________________________________________  RADIOLOGY   ____________________________________________   PROCEDURES  Procedure(s) performed:   Procedures  Critical Care performed:  ____________________________________________   INITIAL IMPRESSION / ASSESSMENT AND PLAN / ED COURSE  Pertinent labs & imaging results that were available during my care of the patient were reviewed by me and considered in my medical decision making (see chart for details).        ____________________________________________   FINAL CLINICAL  IMPRESSION(S) / ED DIAGNOSES  Final diagnoses:  Hallucinations      NEW MEDICATIONS STARTED DURING THIS VISIT:  New Prescriptions   No medications on file     Note:  This document was prepared using Dragon voice recognition software and may include unintentional dictation errors.    Arnaldo Natal, MD 01/05/17 [phone]

## 2017-01-07 ENCOUNTER — Telehealth: Payer: Self-pay

## 2017-01-07 NOTE — Telephone Encounter (Signed)
Very good. Thank you!

## 2017-01-07 NOTE — Telephone Encounter (Signed)
received notification that the prior authorization for the olanzapine  tablet 60 tablets per 30 days was approved from 09-22-16 to  09-21-17.

## 2017-01-07 NOTE — Telephone Encounter (Signed)
went online and did prior authorization pending review.

## 2017-01-07 NOTE — Telephone Encounter (Signed)
prior authorization needed on the olanzapine 

## 2017-01-13 ENCOUNTER — Encounter: Payer: Self-pay | Admitting: Psychiatry

## 2017-01-13 ENCOUNTER — Ambulatory Visit (INDEPENDENT_AMBULATORY_CARE_PROVIDER_SITE_OTHER): Payer: Medicare Other | Admitting: Psychiatry

## 2017-01-13 VITALS — BP 138/92 | HR 80 | Temp 98.0°F | Wt 361.8 lb

## 2017-01-13 DIAGNOSIS — F1010 Alcohol abuse, uncomplicated: Secondary | ICD-10-CM

## 2017-01-13 DIAGNOSIS — F200 Paranoid schizophrenia: Secondary | ICD-10-CM

## 2017-01-14 MED ORDER — HALOPERIDOL 5 MG PO TABS: 5.0000 mg | ORAL_TABLET | Freq: Two times a day (BID) | ORAL | 1 refills | Status: DC

## 2017-01-14 NOTE — Progress Notes (Signed)
Follow-up 36 year old man with schizophrenia and alcohol abuse. Patient is telling me that despite the recent increase in Zyprexa his voices are even worse. Hearing voices all the time. Very disturbed by them. Mood is sad and down. Can't sleep well at night. Denies any suicidal thoughts intense or plan or homicidal ideation. He says that he has mostly cut his alcohol out although he admits that he drank a quart of beer last night thinking it would get rid of the hallucinations. Of course it did not.  Patient looks even more run down and disheveled than usual. Affect sad and down and blunt. No suicidality but seems distressed and somewhat hopeless.  Given lack of response to increased dose of Zyprexa I think we should simply change medicine. Instructed him to discontinue Zyprexa and start haloperidol 5 mg twice a day. Prescription completed. He is to follow-up in 2 weeks. Encouraged his continued attempts to stay sober. We will see him back in a couple weeks.

## 2017-01-15 ENCOUNTER — Ambulatory Visit (INDEPENDENT_AMBULATORY_CARE_PROVIDER_SITE_OTHER): Payer: Medicare Other | Admitting: Nurse Practitioner

## 2017-01-15 ENCOUNTER — Encounter: Payer: Self-pay | Admitting: Nurse Practitioner

## 2017-01-15 VITALS — BP 99/49 | HR 82 | Temp 98.2°F | Resp 16 | Ht 75.0 in | Wt 362.0 lb

## 2017-01-15 DIAGNOSIS — M6281 Muscle weakness (generalized): Secondary | ICD-10-CM

## 2017-01-15 DIAGNOSIS — F5089 Other specified eating disorder: Secondary | ICD-10-CM | POA: Diagnosis not present

## 2017-01-15 DIAGNOSIS — I10 Essential (primary) hypertension: Secondary | ICD-10-CM

## 2017-01-15 DIAGNOSIS — Z7689 Persons encountering health services in other specified circumstances: Secondary | ICD-10-CM

## 2017-01-15 DIAGNOSIS — R251 Tremor, unspecified: Secondary | ICD-10-CM

## 2017-01-15 DIAGNOSIS — F1010 Alcohol abuse, uncomplicated: Secondary | ICD-10-CM

## 2017-01-15 DIAGNOSIS — E119 Type 2 diabetes mellitus without complications: Secondary | ICD-10-CM

## 2017-01-15 DIAGNOSIS — R569 Unspecified convulsions: Secondary | ICD-10-CM

## 2017-01-15 DIAGNOSIS — K7031 Alcoholic cirrhosis of liver with ascites: Secondary | ICD-10-CM

## 2017-01-15 NOTE — Progress Notes (Signed)
Subjective:    Patient ID: Howard [name], male    DOB: 04-06-1981, 35 y.o.   MRN: [id number]  Howard [name] is a 36 y.o. male presenting on 01/15/2017 for New Patient (Initial Visit) (Patient here to establish care, pt was seening Howard [name] at [facility]. Patient reports feeling fairly well. )  Pt is here today to establish care with a new PCP.  He states Howard [name] was stressing him out, stating "he meant well but handling the stress isn't easy." Pt attributes this stress as the stressor that prompted his cutting again.  Recent ED visit for Schizophrenia exacerbation with hallucinations 01/05/2017 and hepatic encephalopathy 12/29/2016 for which he saw Howard [name] for hospital f/u appointment. History significant for 8 hospital admissions and 10 ED visits in last 6 months for seizures, hepatic encephalopathy, upper GI bleed, and schizophrenic hallucinations.  HPI  Cirrhosis: Alcohol induced with h/o esophageal varices and hepatic encephalitis Recent hospitalization on 4/9 for hepatic encephalitis.  He has a tremor today, but is unable to remember if it had gone away since hospitalization or if it has worsened.  He most significant symptom today is patient complaint of "really craving" ice and salt.  He has had nose bleeds, jaundice, diarrhea (he takes lactulose), weakness, and fatigue. He denies dark tarry stools, blood in stool, n/v or coffee ground emesis or abdominal pain.  Pt states he still drinks alcohol 2-3 days per week and drinks "1 qt" of beer per occurrence.  He has stopped all vodka since cirrhosis.  He has not been able to stop completely.   Seizures His last seizure was yesterday.  It lasted a short period of time and he did not need to seek additional care for this.  He receives his medication in pre-dosed "bubble packs."  He states he takes medicine for his seizures.   He has generalized weakness. He states he "feels weak" and can lose his balance. He does receive home PT treatment for  strengthening and balance.   Schizophrenia Had another episode last night of cutting.  He states he told his nurse and she encouraged him that it takes a few weeks for medicines to change.  Meds were recently changed by Howard [name] to address his hallucinations which tell him to cut himself.  He states he uses alcohol to self-medicate and "stop the voices" in his head. He used alcohol last night to help after he started cutting.   DM He does check his CBGs at home.  He states they are usually around 126.  Highest he remembers was 130 about 1 week ago.  Pt denies polydipsia, polyphagia, polyuria, headaches, diaphoresis, shakiness, chills, pain, numbness or tingling in extremities, and changes in vision.  Dinner plan tonight is [employer] for roast beef and "maybe a piece of pie."  No vegetables.  Hypertension He does not know which medications he is taking because they come in pill packs.  He does not check his BP at home.    Pt denies headache, lightheadedness, dizziness, changes in vision, chest tightness/pressure, palpitations, sudden loss of speech or loss of consiousness.   Past Medical History:  Diagnosis Date  . Alcoholic cirrhosis of liver with ascites (HCC)   . Alcoholism (HCC)   . Anemia   . COPD (chronic obstructive pulmonary disease) (HCC)   . Depression   . Diabetes mellitus, type II (HCC)   . Esophageal varices (HCC)   . Heart disease    irregular heart beat (palpitations) and heart  murmur  . Hyperlipemia   . Hypertension   . Liver disease   . Multiple thyroid nodules   . Portal hypertensive gastropathy (HCC)   . Schizophrenia (HCC)   . Seizures (HCC)    Past Surgical History:  Procedure Laterality Date  . ESOPHAGOGASTRODUODENOSCOPY N/A 10/05/2015   Procedure: ESOPHAGOGASTRODUODENOSCOPY (EGD);  Surgeon: Elnita Maxwell, MD;  Location: [facility] ENDOSCOPY;  Service: Endoscopy;  Laterality: N/A;  . ESOPHAGOGASTRODUODENOSCOPY (EGD) WITH PROPOFOL N/A 08/03/2015    Procedure: ESOPHAGOGASTRODUODENOSCOPY (EGD) WITH PROPOFOL;  Surgeon: Elnita Maxwell, MD;  Location: [facility] ENDOSCOPY;  Service: Endoscopy;  Laterality: N/A;  . ESOPHAGOGASTRODUODENOSCOPY (EGD) WITH PROPOFOL N/A 08/31/2015   Procedure: ESOPHAGOGASTRODUODENOSCOPY (EGD) WITH PROPOFOL;  Surgeon: Elnita Maxwell, MD;  Location[facility] ENDOSCOPY;  Service: Endoscopy;  Laterality: N/A;  . ESOPHAGOGASTRODUODENOSCOPY (EGD) WITH PROPOFOL N/A 04/04/2016   Procedure: ESOPHAGOGASTRODUODENOSCOPY (EGD) WITH PROPOFOL;  Surgeon: Scot Jun, MD;  Location: [facility] ENDOSCOPY;  Service: Endoscopy;  Laterality: N/A;  . ESOPHAGOGASTRODUODENOSCOPY (EGD) WITH PROPOFOL N/A 11/13/2016   Procedure: ESOPHAGOGASTRODUODENOSCOPY (EGD) WITH PROPOFOL;  Surgeon: Wyline Mood, MD;  Location: [facility] ENDOSCOPY;  Service: Endoscopy;  Laterality: N/A;  . NO PAST SURGERIES     Social History   Social History  . Marital status: Single    Spouse name: N/A  . Number of children: N/A  . Years of education: N/A   Occupational History  . disabled    Social History Main Topics  . Smoking status: Never Smoker  . Smokeless tobacco: Never Used  . Alcohol use 3.6 oz/week    6 Cans of beer per week     Comment: "one quart" 2-3 times per week  . Drug use: No  . Sexual activity: No   Other Topics Concern  . Not on file   Social History Narrative   ** Merged History Encounter **       Family History  Problem Relation Age of Onset  . Heart disease Mother   . Hypertension Mother   . Hyperlipidemia Mother   . Stroke Father   . Heart attack Father   . Hypertension Father   . Heart disease Father   . Alcohol abuse Father   . Heart disease Brother    Current Outpatient Prescriptions on File Prior to Visit  Medication Sig  . acamprosate (CAMPRAL) 333 MG tablet Take 2 tablets (666 mg total) by mouth 3 (three) times daily with meals.  Marland Kitchen albuterol (PROVENTIL HFA;VENTOLIN HFA) 108 (90 Base) MCG/ACT inhaler Inhale 2 puffs into the  lungs every 6 (six) hours as needed for wheezing or shortness of breath.  . citalopram (CELEXA) 20 MG tablet Take 1 tablet (20 mg total) by mouth every morning.  . folic acid (FOLVITE) 1 MG tablet Take 1 tablet (1 mg total) by mouth daily.  . haloperidol (HALDOL) 5 MG tablet Take 1 tablet (5 mg total) by mouth 2 (two) times daily.  Marland Kitchen lactulose (CHRONULAC) 10 GM/15ML solution Take 45 mLs (30 g total) by mouth every 4 (four) hours. (Patient taking differently: Take 30 g by mouth 4 (four) times daily. )  . levETIRAcetam (KEPPRA) 750 MG tablet Take 2 tablets (1,500 mg total) by mouth 2 (two) times daily.  Marland Kitchen losartan (COZAAR) 25 MG tablet Take 25 mg by mouth daily.  . metFORMIN (GLUCOPHAGE) 500 MG tablet Take 1 tablet (500 mg total) by mouth 2 (two) times daily with a meal.  . nadolol (CORGARD) 20 MG tablet Take 20 mg by mouth daily.  Marland Kitchen  rifaximin (XIFAXAN) 550 MG TABS tablet Take 1 tablet (550 mg total) by mouth 2 (two) times daily.  Marland Kitchen spironolactone (ALDACTONE) 50 MG tablet Take 1 tablet (50 mg total) by mouth 2 (two) times daily.  . sucralfate (CARAFATE) 1 g tablet Take 1 g by mouth 4 (four) times daily.  . traZODone (DESYREL) 100 MG tablet Take 100 mg by mouth at bedtime as needed for sleep.    No current facility-administered medications on file prior to visit.     Review of Systems  Constitutional: Negative.   HENT: Positive for nosebleeds.        Epistaxis last night  Eyes: Negative.   Respiratory: Negative.   Cardiovascular: Positive for palpitations and leg swelling. Negative for chest pain.  Gastrointestinal: Positive for diarrhea and vomiting.       No coffee ground emesis  Endocrine: Positive for polydipsia, polyphagia and polyuria. Negative for cold intolerance and heat intolerance.  Musculoskeletal: Positive for arthralgias.       Knee arthralgia  Skin: Positive for rash.       Lesions from cutting  Allergic/Immunologic: Negative.   Neurological: Positive for tremors.        Tremor when ammonia level is up Balance difficulty  Psychiatric/Behavioral: Positive for agitation and hallucinations.       Auditory   Per HPI unless specifically indicated above     Objective:    BP (!) 99/49 (BP Location: Left Arm, Patient Position: Sitting, Cuff Size: Large)   Pulse 82   Temp 98.2 F (36.8 C) (Oral)   Resp 16   Ht  (1.905 m)   Wt (!) 362 lb (164.2 kg)   SpO2 91% Comment: pt is on 2L of O2, pt reports he forgot it today.  BMI 45.25 kg/m    Wt Readings from Last 3 Encounters:  01/15/17 (!) 362 lb (164.2 kg)  01/13/17 (!) 361 lb 12.8 oz (164.1 kg)  01/05/17 (!) 333 lb (151 kg)    Physical Exam  Constitutional: He is oriented to person, place, and time. He appears well-developed.  Obese  HENT:  Head: Normocephalic and atraumatic.  Right Ear: Hearing, tympanic membrane and external ear normal.  Left Ear: Hearing, tympanic membrane and external ear normal. Left ear exhibits lacerations.  Nose: Right sinus exhibits no maxillary sinus tenderness and no frontal sinus tenderness. Left sinus exhibits no maxillary sinus tenderness and no frontal sinus tenderness.  Mouth/Throat: Oropharynx is clear and moist. Mucous membranes are pale. Abnormal dentition. Dental caries present.  L canal with scabbing on posterior aspect. R canal with erythema and scratches. Nose with dried blood. Molars missing.  Broken tooth front incisors  Eyes: Conjunctivae are normal. Pupils are equal, round, and reactive to light.  Neck: Normal range of motion. Neck supple. No JVD present. No tracheal deviation present. No thyromegaly present.  Cardiovascular: Normal rate, regular rhythm and intact distal pulses.   Murmur heard. Pulmonary/Chest: Effort normal and breath sounds normal. No respiratory distress. He has no wheezes.  Abdominal: Normal appearance and bowel sounds are normal. He exhibits ascites. He exhibits no fluid wave. There is hepatomegaly. There is tenderness in the right  upper quadrant.  Musculoskeletal:  +3 non-pitting pedal edema Unable to put on shoes Gait impairment: Foot slap with toes first.  Lymphadenopathy:    He has no cervical adenopathy.  Neurological: He is alert and oriented to person, place, and time. No cranial nerve deficit. Coordination abnormal.  Positive romberg.  Positive heel-toe. Weakness in  BLE  Skin: Skin is warm and dry. Laceration noted. There is pallor.     Psychiatric: His speech is normal. He is withdrawn. Thought content is not paranoid and not delusional. He does not express impulsivity or inappropriate judgment. He exhibits a depressed mood. He expresses no suicidal plans and no homicidal plans.  Flat affect He is attentive.    Diabetic Foot Exam - Simple   Simple Foot Form Diabetic Foot exam was performed with the following findings:  Yes 01/15/2017  2:40 PM  Visual Inspection See comments:  Yes Sensation Testing Intact to touch and monofilament testing bilaterally:  Yes Pulse Check Posterior Tibialis and Dorsalis pulse intact bilaterally:  Yes Comments Fungal lichenification of skin around toes and callus noted over heels bilaterally     Results for orders placed or performed during the hospital encounter of 01/05/17  Comprehensive metabolic panel  Result Value Ref Range   Sodium 138 135 - 145 mmol/L   Potassium 4.2 3.5 - 5.1 mmol/L   Chloride 100 (L) 101 - 111 mmol/L   CO2 30 22 - 32 mmol/L   Glucose, Bld 174 (H) 65 - 99 mg/dL   BUN 14 6 - 20 mg/dL   Creatinine, Ser 8.29 0.61 - 1.24 mg/dL   Calcium 9.2 8.9 - 56.2 mg/dL   Total Protein 8.5 (H) 6.5 - 8.1 g/dL   Albumin 3.7 3.5 - 5.0 g/dL   AST 52 (H) 15 - 41 U/L   ALT 38 17 - 63 U/L   Alkaline Phosphatase 110 38 - 126 U/L   Total Bilirubin 0.6 0.3 - 1.2 mg/dL   GFR calc non Af Amer >60 >60 mL/min   GFR calc Af Amer >60 >60 mL/min   Anion gap 8 5 - 15  Ethanol  Result Value Ref Range   Alcohol, Ethyl (B) <5 <5 mg/dL  Salicylate level  Result Value  Ref Range   Salicylate Lvl <7.0 2.8 - 30.0 mg/dL  Acetaminophen level  Result Value Ref Range   Acetaminophen (Tylenol), Serum <10 (L) 10 - 30 ug/mL  cbc  Result Value Ref Range   WBC 6.5 3.8 - 10.6 K/uL   RBC 3.97 (L) 4.40 - 5.90 MIL/uL   Hemoglobin 9.9 (L) 13.0 - 18.0 g/dL   HCT 13.0 (L) 86.5 - 78.4 %   MCV 79.8 (L) 80.0 - 100.0 fL   MCH 24.8 (L) 26.0 - 34.0 pg   MCHC 31.1 (L) 32.0 - 36.0 g/dL   RDW 69.6 (H) 29.5 - 28.4 %   Platelets 118 (L) 150 - 440 K/uL  Urine Drug Screen, Qualitative  Result Value Ref Range   Tricyclic, Ur Screen NONE DETECTED NONE DETECTED   Amphetamines, Ur Screen NONE DETECTED NONE DETECTED   MDMA (Ecstasy)Ur Screen NONE DETECTED NONE DETECTED   Cocaine Metabolite,Ur [place] NONE DETECTED NONE DETECTED   Opiate, Ur Screen NONE DETECTED NONE DETECTED   Phencyclidine (PCP) Ur S NONE DETECTED NONE DETECTED   Cannabinoid 50 Ng, Ur [place] NONE DETECTED NONE DETECTED   Barbiturates, Ur Screen NONE DETECTED NONE DETECTED   Benzodiazepine, Ur Scrn POSITIVE (A) NONE DETECTED   Methadone Scn, Ur NONE DETECTED NONE DETECTED  Ammonia  Result Value Ref Range   Ammonia 54 (H) 9 - 35 umol/L    Personal review of documentation of recent hospital encounter 12/29/2016 reveals resolving hepatic encephalopathy with persistently elevated ammonia levels.  Pt to continue lactulose.  Personal review of documentation of BP medications from past encounters reveals  uncertain course of recent medication dose adjustments.  Pt's most recent hospital discharge summary reveals patient is taking the following BP medications which are the same as preadmission dosing: losartan 25 mg once daily nadolol 20 mg once daily spironolactone 50 mg one tablet twice daily    Assessment & Plan:   Problem List Items Addressed This Visit      Cardiovascular and Mediastinum   Essential (primary) hypertension    Chronic hypertension, controlled today and on low end of normal at 99/49.  Patient at risk for  falls.  Plan: 1. Confirm medications with Apothecary pharmacy. 2. Consider reducing dosage of one medication to prevent hypotension and reduce risk for falls.  BP control goals lower with risks for GI bleed, but pt also with risks of falls.      Relevant Medications   furosemide (LASIX) 20 MG tablet   Other Relevant Orders   Lipid panel     Digestive   Alcoholic cirrhosis of liver with ascites (HCC)    Chronic, unstable problem. Ongoing liver flap tremor. Last GI appointment was missed by patient.  Possibly r/t ED or other office visit for Schizophrenia.  Plan: 1. Evaluate ammonia level. 2. Reconnect with GI for follow up appointment.      Relevant Orders   CBC with Differential   Ammonia   Ferritin   Lipid panel     Endocrine   Controlled type 2 diabetes mellitus without complication (HCC)    Recent A1c = 6.9 on 11/2016  Plan: 1. Continue current therapies and recheck A1c in 3 months.      Relevant Orders   Lipid panel     Other   Alcohol abuse    Ongoing alcohol use 2-3 days per week. Pt consumes "1 quart" of beer per occurrence.  Plan: 1. Discussed importance of quitting completely.  Pt admits use for coping with hallucinations.  Recent psychological medication management should reduce these hallucinations over the next 6 weeks as they were previously controlled. 2. Encouraged patient to buy a smaller beer and only drink one.  He verbalized understanding and stated, "I hadn't thought about that."      Relevant Orders   Lipid panel   Muscle weakness (generalized)    Patient receives home PT.  Plan: 1. Continue current treatment plan.      Seizures (HCC)    Ongoing seziures with last seizure 01/13/2017.  Onset 05/2016 and being seen by neurology at [facility].  Plan: 1. Continue current medications. 2. Continue follow up with neurology. 3. If seizure does not resolve, seek emergency care.       Other Visit Diagnoses    Pica in adults    -   Primary   History of anemia and GI bleed.  Plan: 1. Check CBC and ferritin for evaluation of possible iron deficiency anemia.   Relevant Orders   CBC with Differential   Ferritin   Encounter to establish care with new doctor       Tremor of both hands       Recent hospitalization for hepatic encephalopathy.  Plan: 1. Recheck ammonia level for evaluation of tremor/possible worsening ammonia level.   Relevant Orders   Ammonia      Meds ordered this encounter  Medications - none changed     Follow up plan: Return in about 3 months (around 04/16/2017) for BP, DM, lipid, cirrhosis.  [name], DNP, AGPCNP-BC Adult Gerontology Primary Care Nurse Practitioner [facility] 01/16/2017, 10:02 AM

## 2017-01-15 NOTE — Patient Instructions (Signed)
Howard [name], Thank you for coming in to clinic today.  1. For your alcohol use: - Buy a smaller beer.  Continue to lower the total amount of alcohol you drink. - Work towards stopping all alcohol.  2. For your eating ice and salt: - We are checking your blood counts (hemoglobin and hematocrit and ferritin).  You may have anemia or low blood counts.  3. For your tremor: - I am checking your ammonia level.  4. For your blood pressure, I will make sure your pharmacy knows to stop the furosemide in your next pill packs. Continue taking all other medicines.  You might have iron deficiency anemia which you can read about on the next pages.  5. Keep seeing [name] for your moods and schizophrenia.  Please schedule a follow-up appointment with [name], AGNP in 3 months.  If you have any other questions or concerns, please feel free to call the clinic or send a message through MyChart. You may also schedule an earlier appointment if necessary.  [name], DNP, AGNP-BC Adult Gerontology Nurse Practitioner [facility]    Iron Deficiency Anemia, Adult Iron-deficiency anemia is when you have a low amount of red blood cells or hemoglobin. This happens because you have too little iron in your body. Hemoglobin carries oxygen to parts of the body. Anemia can cause your body to not get enough oxygen. It may or may not cause symptoms. Follow these instructions at home: Medicines   Take over-the-counter and prescription medicines only as told by your doctor. This includes iron pills (supplements) and vitamins.  If you cannot handle taking iron pills by mouth, ask your doctor about getting iron through:  A vein (intravenously).  A shot (injection) into a muscle.  Take iron pills when your stomach is empty. If you cannot handle this, take them with food.  Do not drink milk or take antacids at the same time as your iron pills.  To prevent trouble pooping  (constipation), eat fiber or take medicine (stool softener) as told by your doctor. Eating and drinking   Talk with your doctor before changing the foods you eat. He or she may tell you to eat foods that have a lot of iron, such as:  Liver.  Lowfat (lean) beef.  Breads and cereals that have iron added to them (fortified breads and cereals).  Eggs.  Dried fruit.  Dark green, leafy vegetables.  Drink enough fluid to keep your pee (urine) clear or pale yellow.  Eat fresh fruits and vegetables that are high in vitamin C. They help your body to use iron. Foods with a lot of vitamin C include:  Oranges.  Peppers.  Tomatoes.  Mangoes. General instructions   Return to your normal activities as told by your doctor. Ask your doctor what activities are safe for you.  Keep yourself clean, and keep things clean around you (your surroundings). Anemia can make you get sick more easily.  Keep all follow-up visits as told by your doctor. This is important. Contact a doctor if:  You feel sick to your stomach (nauseous).  You throw up (vomit).  You feel weak.  You are sweating for no clear reason.  You have trouble pooping, such as:  Pooping (having a bowel movement) less than 3 times a week.  Straining to poop.  Having poop that is hard, dry, or larger than normal.  Feeling full or bloated.  Pain in the lower belly.  Not feeling better after  pooping. Get help right away if:  You pass out (faint). If this happens, do not drive yourself to the hospital. Call your local emergency services (911 in the [place]  You have chest pain.  You have shortness of breath that:  Is very bad.  Gets worse with physical activity.  You have a fast heartbeat.  You get light-headed when getting up from sitting or lying down. This information is not intended to replace advice given to you by your health care provider. Make sure you discuss any questions you have with your health care  provider. Document Released: 10/11/2010 Document Revised: 05/28/2016 Document Reviewed: 05/28/2016 Elsevier Interactive Patient Education  2017 [employer].

## 2017-01-16 ENCOUNTER — Telehealth: Payer: Self-pay

## 2017-01-16 NOTE — Assessment & Plan Note (Addendum)
Recent A1c = 6.9 on 11/2016  Plan: 1. Continue current therapies and recheck A1c in 3 months.

## 2017-01-16 NOTE — Telephone Encounter (Signed)
Spoke to [employer] coach) at [employer]. Mrs. Ander Slade reports that patient does receive his pill prepackaged. Verified medications. sd

## 2017-01-16 NOTE — Assessment & Plan Note (Signed)
Ongoing alcohol use 2-3 days per week. Pt consumes "1 quart" of beer per occurrence.  Plan: 1. Discussed importance of quitting completely.  Pt admits use for coping with hallucinations.  Recent psychological medication management should reduce these hallucinations over the next 6 weeks as they were previously controlled. 2. Encouraged patient to buy a smaller beer and only drink one.  He verbalized understanding and stated, "I hadn't thought about that."

## 2017-01-16 NOTE — Progress Notes (Signed)
I have reviewed this encounter including the documentation in this note and/or discussed this patient with the provider, [name], AGPCNP-BC. I am certifying that I agree with the content of this note as supervising physician.  Saralyn Pilar, DO [facility] 01/16/2017, 3:02 PM

## 2017-01-16 NOTE — Assessment & Plan Note (Signed)
Patient receives home PT.  Plan: 1. Continue current treatment plan.

## 2017-01-16 NOTE — Assessment & Plan Note (Signed)
Chronic, unstable problem. Ongoing liver flap tremor. Last GI appointment was missed by patient.  Possibly r/t ED or other office visit for Schizophrenia.  Plan: 1. Evaluate ammonia level. 2. Reconnect with GI for follow up appointment.

## 2017-01-16 NOTE — Assessment & Plan Note (Addendum)
Ongoing seziures with last seizure 01/13/2017.  Onset 05/2016 and being seen by neurology at [facility].  Plan: 1. Continue current medications. 2. Continue follow up with neurology. 3. If seizure does not resolve, seek emergency care.

## 2017-01-16 NOTE — Assessment & Plan Note (Signed)
Chronic hypertension, controlled today and on low end of normal at 99/49.  Patient at risk for falls.  Plan: 1. Confirm medications with [facility]othe[facility]ary pharmacy. 2. Consider reducing dosage of one medication to prevent hypotension and reduce risk for falls.  BP control goals lower with risks for GI bleed, but pt also with risks of falls.

## 2017-01-16 NOTE — Telephone Encounter (Signed)
Called [facility] for med list.  GI appointment scheduled for 01/19/17 9 am with Judithann Graves. LMTCB. sd

## 2017-01-16 NOTE — Telephone Encounter (Signed)
-----   Message from [name], NP sent at 01/15/2017  4:55 PM EDT ----- Please call patient pharmacy for a current list of medications.  Pt states he takes pill packs.  Pt sees [facility] with ANP - Amedeo Kinsman.  He has missed his last appointment during his most recent hospitalizations.  He still needs to get in to see them. Can you find out if they have rescheduled?  If not, please ask them to try to reschedule.    Thank you

## 2017-01-16 NOTE — Telephone Encounter (Signed)
Spoke to [place] from [facility] at [phone] to confirm medications for patient.

## 2017-01-18 ENCOUNTER — Emergency Department: Payer: Medicare Other

## 2017-01-18 ENCOUNTER — Inpatient Hospital Stay
Admission: EM | Admit: 2017-01-18 | Discharge: 2017-01-19 | DRG: 441 | Disposition: A | Discharge status: Home Health | Payer: Medicare Other | Attending: Internal Medicine | Admitting: Internal Medicine

## 2017-01-18 ENCOUNTER — Encounter: Payer: Self-pay | Admitting: Emergency Medicine

## 2017-01-18 DIAGNOSIS — L02211 Cutaneous abscess of abdominal wall: Secondary | ICD-10-CM | POA: Diagnosis present

## 2017-01-18 DIAGNOSIS — Z8249 Family history of ischemic heart disease and other diseases of the circulatory system: Secondary | ICD-10-CM | POA: Diagnosis not present

## 2017-01-18 DIAGNOSIS — R569 Unspecified convulsions: Secondary | ICD-10-CM | POA: Diagnosis present

## 2017-01-18 DIAGNOSIS — J9690 Respiratory failure, unspecified, unspecified whether with hypoxia or hypercapnia: Secondary | ICD-10-CM | POA: Diagnosis present

## 2017-01-18 DIAGNOSIS — F1020 Alcohol dependence, uncomplicated: Secondary | ICD-10-CM | POA: Diagnosis present

## 2017-01-18 DIAGNOSIS — K766 Portal hypertension: Secondary | ICD-10-CM | POA: Diagnosis present

## 2017-01-18 DIAGNOSIS — K7290 Hepatic failure, unspecified without coma: Principal | ICD-10-CM | POA: Diagnosis present

## 2017-01-18 DIAGNOSIS — J449 Chronic obstructive pulmonary disease, unspecified: Secondary | ICD-10-CM | POA: Diagnosis present

## 2017-01-18 DIAGNOSIS — Z6841 Body Mass Index (BMI) 40.0 and over, adult: Secondary | ICD-10-CM

## 2017-01-18 DIAGNOSIS — Z9114 Patient's other noncompliance with medication regimen: Secondary | ICD-10-CM

## 2017-01-18 DIAGNOSIS — K3189 Other diseases of stomach and duodenum: Secondary | ICD-10-CM | POA: Diagnosis present

## 2017-01-18 DIAGNOSIS — R0902 Hypoxemia: Secondary | ICD-10-CM

## 2017-01-18 DIAGNOSIS — Z7984 Long term (current) use of oral hypoglycemic drugs: Secondary | ICD-10-CM | POA: Diagnosis not present

## 2017-01-18 DIAGNOSIS — E119 Type 2 diabetes mellitus without complications: Secondary | ICD-10-CM | POA: Diagnosis present

## 2017-01-18 DIAGNOSIS — K7030 Alcoholic cirrhosis of liver without ascites: Secondary | ICD-10-CM | POA: Diagnosis present

## 2017-01-18 DIAGNOSIS — F329 Major depressive disorder, single episode, unspecified: Secondary | ICD-10-CM | POA: Diagnosis present

## 2017-01-18 DIAGNOSIS — F200 Paranoid schizophrenia: Secondary | ICD-10-CM | POA: Diagnosis present

## 2017-01-18 DIAGNOSIS — K7682 Hepatic encephalopathy: Secondary | ICD-10-CM | POA: Diagnosis present

## 2017-01-18 DIAGNOSIS — F209 Schizophrenia, unspecified: Secondary | ICD-10-CM | POA: Diagnosis present

## 2017-01-18 DIAGNOSIS — J9601 Acute respiratory failure with hypoxia: Secondary | ICD-10-CM | POA: Diagnosis present

## 2017-01-18 DIAGNOSIS — I10 Essential (primary) hypertension: Secondary | ICD-10-CM | POA: Diagnosis present

## 2017-01-18 DIAGNOSIS — Z79899 Other long term (current) drug therapy: Secondary | ICD-10-CM

## 2017-01-18 DIAGNOSIS — E785 Hyperlipidemia, unspecified: Secondary | ICD-10-CM | POA: Diagnosis present

## 2017-01-18 DIAGNOSIS — Z811 Family history of alcohol abuse and dependence: Secondary | ICD-10-CM

## 2017-01-18 DIAGNOSIS — R110 Nausea: Secondary | ICD-10-CM

## 2017-01-18 LAB — COMPREHENSIVE METABOLIC PANEL
ALBUMIN: 3.4 g/dL — AB (ref 3.5–5.0)
ALT: 29 U/L (ref 17–63)
ANION GAP: 8 (ref 5–15)
AST: 43 U/L — ABNORMAL HIGH (ref 15–41)
Alkaline Phosphatase: 116 U/L (ref 38–126)
BILIRUBIN TOTAL: 0.6 mg/dL (ref 0.3–1.2)
BUN: 10 mg/dL (ref 6–20)
CALCIUM: 8.6 mg/dL — AB (ref 8.9–10.3)
CO2: 32 mmol/L (ref 22–32)
Chloride: 98 mmol/L — ABNORMAL LOW (ref 101–111)
Creatinine, Ser: 0.93 mg/dL (ref 0.61–1.24)
GFR calc non Af Amer: >60 mL/min (ref >60)
GLUCOSE: 139 mg/dL — AB (ref 65–99)
Potassium: 4.4 mmol/L (ref 3.5–5.1)
SODIUM: 138 mmol/L (ref 135–145)
TOTAL PROTEIN: 7.9 g/dL (ref 6.5–8.1)

## 2017-01-18 LAB — URINALYSIS, COMPLETE (UACMP) WITH MICROSCOPIC
Bacteria, UA: NONE SEEN
Bilirubin Urine: NEGATIVE
Glucose, UA: NEGATIVE mg/dL
Ketones, ur: NEGATIVE mg/dL
Leukocytes, UA: NEGATIVE
NITRITE: NEGATIVE
PH: 6 (ref 5.0–8.0)
Protein, ur: 30 mg/dL — AB
SPECIFIC GRAVITY, URINE: 1.015 (ref 1.005–1.030)

## 2017-01-18 LAB — CBC
HCT: 31.5 % — ABNORMAL LOW (ref 40.0–52.0)
Hemoglobin: 9.7 g/dL — ABNORMAL LOW (ref 13.0–18.0)
MCH: 24.7 pg — AB (ref 26.0–34.0)
MCHC: 30.9 g/dL — ABNORMAL LOW (ref 32.0–36.0)
MCV: 80.1 fL (ref 80.0–100.0)
PLATELETS: 140 10*3/uL — AB (ref 150–440)
RBC: 3.94 MIL/uL — ABNORMAL LOW (ref 4.40–5.90)
RDW: 19.5 % — ABNORMAL HIGH (ref 11.5–14.5)
WBC: 6.3 10*3/uL (ref 3.8–10.6)

## 2017-01-18 LAB — BRAIN NATRIURETIC PEPTIDE: B NATRIURETIC PEPTIDE 5: 50 pg/mL (ref 0.0–100.0)

## 2017-01-18 LAB — TROPONIN I

## 2017-01-18 LAB — LIPASE, BLOOD: Lipase: 27 U/L (ref 11–51)

## 2017-01-18 LAB — AMMONIA: Ammonia: 100 umol/L — ABNORMAL HIGH (ref 9–35)

## 2017-01-18 LAB — GLUCOSE, CAPILLARY
GLUCOSE-CAPILLARY: 73 mg/dL (ref 65–99)
Glucose-Capillary: 101 mg/dL — ABNORMAL HIGH (ref 65–99)

## 2017-01-18 MED ORDER — LOSARTAN POTASSIUM 50 MG PO TABS
25.0000 mg | ORAL_TABLET | Freq: Every day | ORAL | Status: DC
  Administered 2017-01-19: 09:00:00 25 mg via ORAL
  Filled 2017-01-18: qty 1

## 2017-01-18 MED ORDER — LEVETIRACETAM 750 MG PO TABS
1500.0000 mg | ORAL_TABLET | Freq: Two times a day (BID) | ORAL | Status: DC
  Administered 2017-01-18 – 2017-01-19 (×2): 1500 mg via ORAL
  Filled 2017-01-18 (×2): qty 2

## 2017-01-18 MED ORDER — HEPARIN SODIUM (PORCINE) 5000 UNIT/ML IJ SOLN
5000.0000 [IU] | Freq: Three times a day (TID) | INTRAMUSCULAR | Status: DC
Start: 2017-01-18 — End: 2017-01-19
  Administered 2017-01-18 – 2017-01-19 (×2): 5000 [IU] via SUBCUTANEOUS
  Filled 2017-01-18 (×2): qty 1

## 2017-01-18 MED ORDER — PANTOPRAZOLE SODIUM 40 MG IV SOLR
INTRAVENOUS | Status: AC
  Administered 2017-01-18: 40 mg via INTRAVENOUS
  Filled 2017-01-18: qty 40

## 2017-01-18 MED ORDER — BISACODYL 10 MG RE SUPP: 10.0000 mg | Freq: Every day | RECTAL | Status: DC | PRN

## 2017-01-18 MED ORDER — INSULIN ASPART 100 UNIT/ML ~~LOC~~ SOLN: 0.0000 [IU] | Freq: Three times a day (TID) | SUBCUTANEOUS | Status: DC

## 2017-01-18 MED ORDER — SPIRONOLACTONE 25 MG PO TABS
50.0000 mg | ORAL_TABLET | Freq: Two times a day (BID) | ORAL | Status: DC
  Administered 2017-01-18 – 2017-01-19 (×2): 50 mg via ORAL
  Filled 2017-01-18 (×2): qty 2

## 2017-01-18 MED ORDER — SODIUM CHLORIDE 0.9 % IV SOLN
INTRAVENOUS | Status: DC
  Administered 2017-01-18: 16:00:00 via INTRAVENOUS

## 2017-01-18 MED ORDER — FOLIC ACID 1 MG PO TABS
1.0000 mg | ORAL_TABLET | Freq: Every day | ORAL | Status: DC
  Administered 2017-01-19: 1 mg via ORAL
  Filled 2017-01-18: qty 1

## 2017-01-18 MED ORDER — HALOPERIDOL 5 MG PO TABS
5.0000 mg | ORAL_TABLET | Freq: Two times a day (BID) | ORAL | Status: DC
  Administered 2017-01-18 – 2017-01-19 (×2): 5 mg via ORAL
  Filled 2017-01-18 (×2): qty 1

## 2017-01-18 MED ORDER — TRAZODONE HCL 100 MG PO TABS
100.0000 mg | ORAL_TABLET | Freq: Every evening | ORAL | Status: DC | PRN
  Administered 2017-01-18: 23:00:00 100 mg via ORAL
  Filled 2017-01-18: qty 1

## 2017-01-18 MED ORDER — METFORMIN HCL 500 MG PO TABS
500.0000 mg | ORAL_TABLET | Freq: Two times a day (BID) | ORAL | Status: DC
  Administered 2017-01-18 – 2017-01-19 (×2): 500 mg via ORAL
  Filled 2017-01-18 (×2): qty 1

## 2017-01-18 MED ORDER — ACETAMINOPHEN 325 MG PO TABS: 650.0000 mg | ORAL_TABLET | Freq: Four times a day (QID) | ORAL | Status: DC | PRN

## 2017-01-18 MED ORDER — IPRATROPIUM-ALBUTEROL 0.5-2.5 (3) MG/3ML IN SOLN
3.0000 mL | Freq: Four times a day (QID) | RESPIRATORY_TRACT | Status: DC
  Administered 2017-01-18 – 2017-01-19 (×2): 3 mL via RESPIRATORY_TRACT
  Filled 2017-01-18 (×2): qty 3

## 2017-01-18 MED ORDER — SUCRALFATE 1 G PO TABS
1.0000 g | ORAL_TABLET | Freq: Three times a day (TID) | ORAL | Status: DC
  Administered 2017-01-18 – 2017-01-19 (×3): 1 g via ORAL
  Filled 2017-01-18 (×3): qty 1

## 2017-01-18 MED ORDER — CEFTRIAXONE SODIUM 1 G IJ SOLR: 1.0000 g | INTRAMUSCULAR | Status: DC

## 2017-01-18 MED ORDER — ONDANSETRON HCL 4 MG PO TABS: 4.0000 mg | ORAL_TABLET | Freq: Four times a day (QID) | ORAL | Status: DC | PRN

## 2017-01-18 MED ORDER — ACETAMINOPHEN 650 MG RE SUPP: 650.0000 mg | Freq: Four times a day (QID) | RECTAL | Status: DC | PRN

## 2017-01-18 MED ORDER — SODIUM CHLORIDE 0.9% FLUSH
3.0000 mL | Freq: Two times a day (BID) | INTRAVENOUS | Status: DC
  Administered 2017-01-19: 3 mL via INTRAVENOUS

## 2017-01-18 MED ORDER — DOCUSATE SODIUM 100 MG PO CAPS
100.0000 mg | ORAL_CAPSULE | Freq: Two times a day (BID) | ORAL | Status: DC
  Administered 2017-01-18 – 2017-01-19 (×2): 100 mg via ORAL
  Filled 2017-01-18 (×2): qty 1

## 2017-01-18 MED ORDER — FUROSEMIDE 20 MG PO TABS
20.0000 mg | ORAL_TABLET | Freq: Every day | ORAL | Status: DC
  Administered 2017-01-19: 09:00:00 20 mg via ORAL
  Filled 2017-01-18: qty 1

## 2017-01-18 MED ORDER — CEFTRIAXONE SODIUM-DEXTROSE 1-3.74 GM-% IV SOLR
INTRAVENOUS | Status: AC
  Administered 2017-01-18: 1000 mg
  Filled 2017-01-18: qty 50

## 2017-01-18 MED ORDER — PANTOPRAZOLE SODIUM 40 MG IV SOLR
40.0000 mg | Freq: Two times a day (BID) | INTRAVENOUS | Status: DC
  Administered 2017-01-18 (×2): 40 mg via INTRAVENOUS
  Filled 2017-01-18: qty 40

## 2017-01-18 MED ORDER — CITALOPRAM HYDROBROMIDE 20 MG PO TABS
20.0000 mg | ORAL_TABLET | ORAL | Status: DC
  Administered 2017-01-19: 20 mg via ORAL
  Filled 2017-01-18: qty 1

## 2017-01-18 MED ORDER — ACAMPROSATE CALCIUM 333 MG PO TBEC
666.0000 mg | DELAYED_RELEASE_TABLET | Freq: Three times a day (TID) | ORAL | Status: DC
  Administered 2017-01-19: 09:00:00 666 mg via ORAL
  Filled 2017-01-18: qty 2

## 2017-01-18 MED ORDER — RIFAXIMIN 200 MG PO TABS
200.0000 mg | ORAL_TABLET | Freq: Three times a day (TID) | ORAL | Status: DC
  Administered 2017-01-18 – 2017-01-19 (×2): 200 mg via ORAL
  Filled 2017-01-18 (×3): qty 1

## 2017-01-18 MED ORDER — NADOLOL 20 MG PO TABS
20.0000 mg | ORAL_TABLET | Freq: Every day | ORAL | Status: DC
  Administered 2017-01-19: 09:00:00 20 mg via ORAL
  Filled 2017-01-18: qty 1

## 2017-01-18 MED ORDER — LACTULOSE 10 GM/15ML PO SOLN
30.0000 g | Freq: Three times a day (TID) | ORAL | Status: DC
  Administered 2017-01-18 – 2017-01-19 (×3): 30 g via ORAL
  Filled 2017-01-18 (×2): qty 60

## 2017-01-18 MED ORDER — ONDANSETRON HCL 4 MG/2ML IJ SOLN: 4.0000 mg | Freq: Four times a day (QID) | INTRAMUSCULAR | Status: DC | PRN

## 2017-01-18 MED ORDER — LACTULOSE 10 GM/15ML PO SOLN
ORAL | Status: AC
  Administered 2017-01-18: 30 g via ORAL
  Filled 2017-01-18: qty 30

## 2017-01-18 NOTE — ED Provider Notes (Signed)
[facility] Emergency Department Provider Note   ____________________________________________   None    (approximate)  I have reviewed the triage vital signs and the nursing notes.   HISTORY  Chief Complaint Nausea    HPI Howard [name] is a 36 y.o. male who comes in for nausea. The nausea started today. It is intermittently bad. Nothing seems to bring it on or make it better. It is in his belly. He says he's been unable to take his lactulose as instructed. This is because he keeps forgetting to take it. He is otherwise doing well nothing's hurting him he does have a 1 cm lump on the abdomen under the skin and right middle abdomen this on exam feels to be in the abdominal wall abscess firm but mobile and nontender. His nausea is coming and going at while he's in the emergency room at present is not there and I'm giving him his antiseizure medicine that he takes at home so he doesn't have a seizure.   Past Medical History:  Diagnosis Date  . Alcoholic cirrhosis of liver with ascites (HCC)   . Alcoholism (HCC)   . Anemia   . COPD (chronic obstructive pulmonary disease) (HCC)   . Depression   . Diabetes mellitus, type II (HCC)   . Esophageal varices (HCC)   . Heart disease    irregular heart beat (palpitations) and heart murmur  . Hyperlipemia   . Hypertension   . Liver disease   . Multiple thyroid nodules   . Portal hypertensive gastropathy (HCC)   . Schizophrenia (HCC)   . Seizures [facility])     Patient Active Problem List   Diagnosis Date Noted  . Respiratory failure with hypoxia (HCC) 12/14/2016  . Seizures (HCC) 12/12/2016  . DNR (do not resuscitate) discussion 08/11/2016  . Muscle weakness (generalized)   . GI bleed 08/07/2016  . OSA (obstructive sleep apnea) 04/29/2016  . Anemia 04/29/2016  . Thrombocytopenia (HCC) 04/29/2016  . Coagulopathy (HCC) 04/29/2016  . Obesity 04/29/2016  . Controlled type 2 diabetes mellitus without complication  (HCC) 01/15/2016  . Essential (primary) hypertension 12/11/2015  . Pure hypercholesterolemia 12/11/2015  . Hepatic encephalopathy (HCC) 10/16/2015  . 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 Elevated transaminase level 07/04/2015  . Alcohol abuse 05/31/2015  . Paranoid schizophrenia (HCC) 03/20/2015    Past Surgical History:  Procedure Laterality Date  . ESOPHAGOGASTRODUODENOSCOPY N/A 10/05/2015   Procedure: ESOPHAGOGASTRODUODENOSCOPY (EGD);  Surgeon: Elnita Maxwell, MD;  Location: [facility] ENDOSCOPY;  Service: Endoscopy;  Laterality: N/A;  . ESOPHAGOGASTRODUODENOSCOPY (EGD) WITH PROPOFOL N/A 08/03/2015   Procedure: ESOPHAGOGASTRODUODENOSCOPY (EGD) WITH PROPOFOL;  Surgeon: Elnita Maxwell, MD;  Location: [facility] ENDOSCOPY;  Service: Endoscopy;  Laterality: N/A;  . ESOPHAGOGASTRODUODENOSCOPY (EGD) WITH PROPOFOL N/A 08/31/2015   Procedure: ESOPHAGOGASTRODUODENOSCOPY (EGD) WITH PROPOFOL;  Surgeon: Elnita Maxwell, MD;  Location: [facility] ENDOSCOPY;  Service: Endoscopy;  Laterality: N/A;  . ESOPHAGOGASTRODUODENOSCOPY (EGD) WITH PROPOFOL N/A 04/04/2016   Procedure: ESOPHAGOGASTRODUODENOSCOPY (EGD) WITH PROPOFOL;  Surgeon: Scot Jun, MD;  Location: [facility] ENDOSCOPY;  Service: Endoscopy;  Laterality: N/A;  . ESOPHAGOGASTRODUODENOSCOPY (EGD) WITH PROPOFOL N/A 11/13/2016   Procedure: ESOPHAGOGASTRODUODENOSCOPY (EGD) WITH PROPOFOL;  Surgeon: Wyline Mood, MD;  Location: [facility] ENDOSCOPY;  Service: Endoscopy;  Laterality: N/A;  . NO PAST SURGERIES      Prior to Admission medications   Medication Sig Start Date End Date Taking? Authorizing Provider  acamprosate (CAMPRAL) 333 MG tablet Take 2 tablets (666 mg total)  by mouth 3 (three) times daily with meals. 08/19/16   Audery Amel, MD  albuterol (PROVENTIL HFA;VENTOLIN HFA) 108 (90 Base) MCG/ACT inhaler Inhale 2 puffs into the lungs every 6 (six) hours as needed for wheezing or shortness of breath. 10/08/16   Shaune Pollack, MD    citalopram (CELEXA) 20 MG tablet Take 1 tablet (20 mg total) by mouth every morning. 08/19/16   Audery Amel, MD  folic acid (FOLVITE) 1 MG tablet Take 1 tablet (1 mg total) by mouth daily. 12/19/16   Ramonita Lab, MD  furosemide (LASIX) 20 MG tablet Take 1 tablet by mouth daily. 12/08/16   Historical Provider, MD  haloperidol (HALDOL) 5 MG tablet Take 1 tablet (5 mg total) by mouth 2 (two) times daily. 01/14/17   Audery Amel, MD  lactulose (CHRONULAC) 10 GM/15ML solution Take 45 mLs (30 g total) by mouth every 4 (four) hours. Patient taking differently: Take 30 g by mouth 4 (four) times daily.  07/09/16   Katharina Caper, MD  levETIRAcetam (KEPPRA) 750 MG tablet Take 2 tablets (1,500 mg total) by mouth 2 (two) times daily. 12/11/16 02/09/17  Merrily Brittle, MD  losartan (COZAAR) 25 MG tablet Take 25 mg by mouth daily.    Historical Provider, MD  metFORMIN (GLUCOPHAGE) 500 MG tablet Take 1 tablet (500 mg total) by mouth 2 (two) times daily with a meal. 05/15/16   Alford Highland, MD  nadolol (CORGARD) 20 MG tablet Take 20 mg by mouth daily.    Historical Provider, MD  NON FORMULARY     Historical Provider, MD  rifaximin (XIFAXAN) 550 MG TABS tablet Take 1 tablet (550 mg total) by mouth 2 (two) times daily. 07/09/16   Katharina Caper, MD  spironolactone (ALDACTONE) 50 MG tablet Take 1 tablet (50 mg total) by mouth 2 (two) times daily. 07/09/16   Katharina Caper, MD  sucralfate (CARAFATE) 1 g tablet Take 1 g by mouth 4 (four) times daily.    Historical Provider, MD  traZODone (DESYREL) 100 MG tablet Take 100 mg by mouth at bedtime as needed for sleep.     Historical Provider, MD  vitamin B-12 (CYANOCOBALAMIN) 100 MCG tablet Take 100 mcg by mouth daily.    Historical Provider, MD    Allergies Patient has no known allergies.  Family History  Problem Relation Age of Onset  . Heart disease Mother   . Hypertension Mother   . Hyperlipidemia Mother   . Stroke Father   . Heart attack Father   .  Hypertension Father   . Heart disease Father   . Alcohol abuse Father   . Heart disease Brother     Social History Social History  Substance Use Topics  . Smoking status: Never Smoker  . Smokeless tobacco: Never Used  . Alcohol use 3.6 oz/week    6 Cans of beer per week     Comment: "one quart" 2-3 times per week    Review of Systems  Constitutional: No fever/chills Eyes: No visual changes. ENT: No sore throat. Cardiovascular: Denies chest pain. Respiratory: Denies shortness of breath. Gastrointestinal: At present No abdominal pain.   nausea, no vomiting.  No diarrhea.  No constipation. Genitourinary: Negative for dysuria. Musculoskeletal: Negative for back pain. Skin: Negative for rash. Neurological: Negative for headaches, focal weakness or numbness.   ____________________________________________   PHYSICAL EXAM:  VITAL SIGNS: ED Triage Vitals  Enc Vitals Group     BP 01/18/17 1319 113/69     Pulse Rate  01/18/17 1319 73     Resp 01/18/17 1319 18     Temp 01/18/17 1319 98.1 F (36.7 C)     Temp Source 01/18/17 1319 Oral     SpO2 01/18/17 1319 95 %     Weight 01/18/17 1319 (!) 362 lb (164.2 kg)     Height 01/18/17 1319  (1.905 m)     Head Circumference --      Peak Flow --      Pain Score 01/18/17 1318 0     Pain Loc --      Pain Edu? --      Excl. in GC? --     Constitutional: Alert and oriented. Well appearing and in no acute distress. Eyes: Conjunctivae are normal. PERRL. EOMI. Head: Atraumatic. Nose: No congestion/rhinnorhea. Mouth/Throat: Mucous membranes are moist.  Oropharynx non-erythematous. Neck: No stridor.  Cardiovascular: Normal rate, regular rhythm. Grossly normal heart sounds.  Good peripheral circulation. Respiratory: Normal respiratory effort.  No retractions. Lungs CTAB. Gastrointestinal: Soft and nontender. No distention. No abdominal bruits. No CVA tenderness.He has a 1 cm firm lump in the right mid abdomen as described in  history of present illness Musculoskeletal: No lower extremity tenderness bilateral mild edema .  No joint effusions. Neurologic:  Normal speech and language. No gross focal neurologic deficits are appreciated. Skin:  Skin is warm, dry and intact. No rash noted. Psychiatric: Mood and affect are normal. Speech and behavior are normal.  ____________________________________________   LABS (all labs ordered are listed, but only abnormal results are displayed)  Labs Reviewed  COMPREHENSIVE METABOLIC PANEL - Abnormal; Notable for the following:       Result Value   Chloride 98 (*)    Glucose, Bld 139 (*)    Calcium 8.6 (*)    Albumin 3.4 (*)    AST 43 (*)    All other components within normal limits  CBC - Abnormal; Notable for the following:    RBC 3.94 (*)    Hemoglobin 9.7 (*)    HCT 31.5 (*)    MCH 24.7 (*)    MCHC 30.9 (*)    RDW 19.5 (*)    Platelets 140 (*)    All other components within normal limits  URINALYSIS, COMPLETE (UACMP) WITH MICROSCOPIC - Abnormal; Notable for the following:    Color, Urine YELLOW (*)    APPearance CLEAR (*)    Hgb urine dipstick LARGE (*)    Protein, ur 30 (*)    Squamous Epithelial / LPF 0-5 (*)    All other components within normal limits  AMMONIA - Abnormal; Notable for the following:    Ammonia 100 (*)    All other components within normal limits  LIPASE, BLOOD   ____________________________________________  EKG  ____________________________________________  RADIOLOGY   ____________________________________________   PROCEDURES  Procedure(s) performed:  Procedures  Critical Care performed:   ____________________________________________   INITIAL IMPRESSION / ASSESSMENT AND PLAN / ED COURSE  Pertinent labs & imaging results that were available during my care of the patient were reviewed by me and considered in my medical decision making (see chart for details).         ____________________________________________   FINAL CLINICAL IMPRESSION(S) / ED DIAGNOSES  Final diagnoses:  Nausea  Hepatic encephalopathy (HCC)      NEW MEDICATIONS STARTED DURING THIS VISIT:  New Prescriptions   No medications on file     Note:  This document was prepared using Dragon voice recognition software and  may include unintentional dictation errors.    Arnaldo Natal, MD 01/18/17 1440

## 2017-01-18 NOTE — Clinical Social Work Note (Signed)
Clinical Social Work Assessment  Patient Details  Name: Howard [name] MRN: [id number] Date of Birth: 08-Sep-1981  Date of referral:  01/18/17               Reason for consult:  Community Resources, Discharge Planning, Care Management Concerns                Permission sought to share information with:  Psychiatrist, Family Supports, Magazine features editor Permission granted to share information::  Yes, Verbal Permission Granted  Name::     Berneice Heinrich [phone] Mother Ms Furr [phone]  Agency::     Relationship::     Contact Information:     Housing/Transportation Living arrangements for the past 2 months:  Single Family Home Source of Information:  Patient, Parent Patient Interpreter Needed:  None Criminal Activity/Legal Involvement Pertinent to Current Situation/Hospitalization:  No - Comment as needed Significant Relationships:  Parents, Friend, Siblings Lives with:  Self, Parents, Siblings Do you feel safe going back to the place where you live?  Yes Need for family participation in patient care:  Yes (Comment)  Care giving concerns:  Patient reports he is not feeling well   Social Worker assessment / plan:  Howard [name] is a 36 y.o. male ( psych diagnosis of schizophrenia- managed) Pt has a past medical history significant for cirrhosis and ascites with hx of recurrent encephalopathy now with worsening lethargy and nausea. In ER, ammonia level back up. Pt is hypoxic. Has not been taking his home meds regularly. He is now admitted. Denies vomiting. No fever. Howard [name] but does c/o SOB/DOE and LE edema. Lives in a home with his brother and his mother has nurse 2x week and PT2x week  Patient gave verbal consent to speak to his mother brother and Dr Toni Amend and any doctor he is attached too. He was a poor historian  ( LCSW did obtain current appointments please verify)Patient and mother were both DISORGANIZED and were not aware of these appointments May 8th Dr  Toni Amend- Pt Psychiatrist, May 24th Dr Ramana/Dr Harold Barban at 11:30, July 27th labs. It was strongly advised the patient consider an ALF/FCH to assist with keeping patient on track with his personal care and medication. Patient refused HE IS HIS OWN GUARDIAN  He is agreeable to make a referral to [facility] and his plan is to return home. He has medicare/medicaid and is oriented x4 however ( LCSW made referral to [facility] on 01/16/17 and assigned care manager will contact LCSW or patient for follow up. Pt is medicaid/ medicare  Mahagony Grieb Deborha Payment [phone]    Employment status:  Disabled (Comment on whether or not currently receiving Disability) ([employer]) Insurance information:  Medicare, Medicaid [place] PT Recommendations:   (PT 2x week in home) Information / Referral to community resources:  Other (Comment Required) (LCSW called [facility] and made referral for Care Coordinator)  Patient/Family's Response to care:  Understands he needs medications but states he gets distracted and forgets ( Mom defended her son and stated she forgets too) Pt plans to use his new phone to remind himself to take his medications through out the day and his brother will set it up  Patient/Family's Understanding of and Emotional Response to Diagnosis, Current Treatment, and Prognosis:  Both mother and patient are poor historians and report they both forget about the medications  Emotional Assessment Appearance:  Appears stated age Attitude/Demeanor/Rapport:   (Polite calm confused and poor historian) Affect (typically observed):  Calm, Flat, In denial Orientation:  Oriented to Self, Oriented to Place, Oriented to  Time, Oriented to Situation Alcohol / Substance use:  Alcohol Use Psych involvement (Current and /or in the community):     Discharge Needs  Concerns to be addressed:  Basic Needs, Care Coordination Readmission within the last 30 days:  Yes Current discharge  risk:  Psychiatric Illness, Other (Forgetful regarding his medications) Barriers to Discharge:  Continued Medical Work up   [place], [place], LCSW 01/18/2017, 4:40 PM

## 2017-01-18 NOTE — ED Triage Notes (Signed)
Pt presents to ED via POV with his mom. Pt states he woke up this morning nauseated and has been most of the day, denies vomiting. Pt also states that he was told to double his lactulose dose for his ammonia level due to cirrhosis but feels that his ammonia level is still high. Pt states personal hx of non-compliance with his lactulose at this time.

## 2017-01-18 NOTE — H&P (Signed)
History and Physical    Howard [name] EAV:[id number] DOB: 1981-05-31 DOA: 01/18/2017  Referring physician: Dr. Darnelle Catalan PCP: [name], NP  Specialists: none  Chief Complaint: nausea and lethargy  HPI: Howard [name] is a 36 y.o. male has a past medical history significant for cirrhosis and ascites with hx of recurrent encephalopathy now with worsening lethargy and nausea. In ER, ammonia level back up. Pt is hypoxic. Has not been taking his home meds regularly. He is now admitted. Denies vomiting. No fever. Denis CP but does c/o SOB/DOE and LE edema  Review of Systems: The patient denies anorexia, fever, weight loss,, vision loss, decreased hearing, hoarseness, chest pain, syncope,  balance deficits, hemoptysis, abdominal pain, melena, hematochezia, severe indigestion/heartburn, hematuria, incontinence, genital sores, muscle weakness, suspicious skin lesions, transient blindness, difficulty walking, depression, unusual weight change, abnormal bleeding, enlarged lymph nodes, angioedema, and breast masses.   Past Medical History:  Diagnosis Date  . Alcoholic cirrhosis of liver with ascites (HCC)   . Alcoholism (HCC)   . Anemia   . COPD (chronic obstructive pulmonary disease) (HCC)   . Depression   . Diabetes mellitus, type II (HCC)   . Esophageal varices (HCC)   . Heart disease    irregular heart beat (palpitations) and heart murmur  . Hyperlipemia   . Hypertension   . Liver disease   . Multiple thyroid nodules   . Portal hypertensive gastropathy (HCC)   . Schizophrenia (HCC)   . Seizures (HCC)    Past Surgical History:  Procedure Laterality Date  . ESOPHAGOGASTRODUODENOSCOPY N/A 10/05/2015   Procedure: ESOPHAGOGASTRODUODENOSCOPY (EGD);  Surgeon: Elnita Maxwell, MD;  Location: [facility] ENDOSCOPY;  Service: Endoscopy;  Laterality: N/A;  . ESOPHAGOGASTRODUODENOSCOPY (EGD) WITH PROPOFOL N/A 08/03/2015   Procedure: ESOPHAGOGASTRODUODENOSCOPY (EGD) WITH PROPOFOL;  Surgeon: Elnita Maxwell, MD;  Location: [facility] ENDOSCOPY;  Service: Endoscopy;  Laterality: N/A;  . ESOPHAGOGASTRODUODENOSCOPY (EGD) WITH PROPOFOL N/A 08/31/2015   Procedure: ESOPHAGOGASTRODUODENOSCOPY (EGD) WITH PROPOFOL;  Surgeon: Elnita Maxwell, MD;  Location: [facility] ENDOSCOPY;  Service: Endoscopy;  Laterality: N/A;  . ESOPHAGOGASTRODUODENOSCOPY (EGD) WITH PROPOFOL N/A 04/04/2016   Procedure: ESOPHAGOGASTRODUODENOSCOPY (EGD) WITH PROPOFOL;  Surgeon: Scot Jun, MD;  Location: [facility] ENDOSCOPY;  Service: Endoscopy;  Laterality: N/A;  . ESOPHAGOGASTRODUODENOSCOPY (EGD) WITH PROPOFOL N/A 11/13/2016   Procedure: ESOPHAGOGASTRODUODENOSCOPY (EGD) WITH PROPOFOL;  Surgeon: Wyline Mood, MD;  Location: [facility] ENDOSCOPY;  Service: Endoscopy;  Laterality: N/A;  . NO PAST SURGERIES     Social History:  reports that he has never smoked. He has never used smokeless tobacco. He reports that he drinks about 3.6 oz of alcohol per week . He reports that he does not use drugs.  No Known Allergies  Family History  Problem Relation Age of Onset  . Heart disease Mother   . Hypertension Mother   . Hyperlipidemia Mother   . Stroke Father   . Heart attack Father   . Hypertension Father   . Heart disease Father   . Alcohol abuse Father   . Heart disease Brother     Prior to Admission medications   Medication Sig Start Date End Date Taking? Authorizing Provider  acamprosate (CAMPRAL) 333 MG tablet Take 2 tablets (666 mg total) by mouth 3 (three) times daily with meals. 08/19/16  Yes Audery Amel, MD  albuterol (PROVENTIL HFA;VENTOLIN HFA) 108 (90 Base) MCG/ACT inhaler Inhale 2 puffs into the lungs every 6 (six) hours as needed for wheezing or shortness of breath. 10/08/16  Yes  Shaune Pollack, MD  citalopram (CELEXA) 20 MG tablet Take 1 tablet (20 mg total) by mouth every morning. 08/19/16  Yes Audery Amel, MD  furosemide (LASIX) 20 MG tablet Take 1 tablet by mouth daily. 12/08/16  Yes Historical Provider, MD  haloperidol  (HALDOL) 5 MG tablet Take 1 tablet (5 mg total) by mouth 2 (two) times daily. 01/14/17  Yes Audery Amel, MD  lactulose (CHRONULAC) 10 GM/15ML solution Take 45 mLs (30 g total) by mouth every 4 (four) hours. Patient taking differently: Take 30 g by mouth 3 (three) times daily.  07/09/16  Yes Katharina Caper, MD  levETIRAcetam (KEPPRA) 750 MG tablet Take 2 tablets (1,500 mg total) by mouth 2 (two) times daily. 12/11/16 02/09/17 Yes Merrily Brittle, MD  losartan (COZAAR) 25 MG tablet Take 25 mg by mouth daily.   Yes Historical Provider, MD  metFORMIN (GLUCOPHAGE) 500 MG tablet Take 1 tablet (500 mg total) by mouth 2 (two) times daily with a meal. 05/15/16  Yes Alford Highland, MD  nadolol (CORGARD) 20 MG tablet Take 20 mg by mouth daily.   Yes Historical Provider, MD  rifaximin (XIFAXAN) 550 MG TABS tablet Take 1 tablet (550 mg total) by mouth 2 (two) times daily. 07/09/16  Yes Katharina Caper, MD  spironolactone (ALDACTONE) 50 MG tablet Take 1 tablet (50 mg total) by mouth 2 (two) times daily. 07/09/16  Yes Katharina Caper, MD  sucralfate (CARAFATE) 1 g tablet Take 1 g by mouth 4 (four) times daily.   Yes Historical Provider, MD  traZODone (DESYREL) 100 MG tablet Take 100 mg by mouth at bedtime as needed for sleep.    Yes Historical Provider, MD  folic acid (FOLVITE) 1 MG tablet Take 1 tablet (1 mg total) by mouth daily. Patient not taking: Reported on 01/18/2017 12/19/16   Ramonita Lab, MD  NON FORMULARY     Historical Provider, MD   Physical Exam: Vitals:   01/18/17 1319  BP: 113/69  Pulse: 73  Resp: 18  Temp: 98.1 F (36.7 C)  TempSrc: Oral  SpO2: 95%  Weight: (!) 164.2 kg (362 lb)  Height:  (1.905 m)     General:  No apparent distress, Howard/AT, WDWN  Eyes: PERRL, EOMI, no scleral icterus, conjunctiva clear  ENT: moist oropharynx without exudate, TM's benign, dentition fair  Neck: supple, no lymphadenopathy. No bruits or thyromegaly  Cardiovascular: regular rate without MRG; 2+  peripheral pulses, no JVD, trace peripheral edema  Respiratory: basilar rhonchi without wheezes or rales. No dullness. Respiratory effort normal  Abdomen: soft, non tender to palpation, positive bowel sounds, no guarding, no rebound  Skin: no rashes or lesions  Musculoskeletal: normal bulk and tone, no joint swelling  Psychiatric: normal mood and affect, A&OX3  Neurologic: CN 2-12 grossly intact, Motor strength 5/5 in all 4 groups with symmetric DTR's and non-focal sensory exam  Labs on Admission:  Basic Metabolic Panel:  Recent Labs Lab 01/18/17 1319  NA 138  K 4.4  CL 98*  CO2 32  GLUCOSE 139*  BUN 10  CREATININE 0.93  CALCIUM 8.6*   Liver Function Tests:  Recent Labs Lab 01/18/17 1319  AST 43*  ALT 29  ALKPHOS 116  BILITOT 0.6  PROT 7.9  ALBUMIN 3.4*    Recent Labs Lab 01/18/17 1319  LIPASE 27    Recent Labs Lab 01/18/17 1319  AMMONIA 100*   CBC:  Recent Labs Lab 01/18/17 1319  WBC 6.3  HGB 9.7*  HCT 31.5*  MCV 80.1  PLT 140*   Cardiac Enzymes: No results for input(s): CKTOTAL, CKMB, CKMBINDEX, TROPONINI in the last 168 hours.  BNP (last 3 results)  Recent Labs  10/04/16 1440  BNP 53.0    ProBNP (last 3 results) No results for input(s): PROBNP in the last 8760 hours.  CBG: No results for input(s): GLUCAP in the last 168 hours.  Radiological Exams on Admission: No results found.  EKG: Independently reviewed.  Assessment/Plan Principal Problem:   Hepatic encephalopathy (HCC) Active Problems:   Paranoid schizophrenia (HCC)   Respiratory failure with hypoxia (HCC)   Nausea   Will admit to floor with IV fluids and empiric IV ABX. Resume lactulose and Xifaxan. Consult GI. Repeat labs in AM. CSW consult for placement due to non-compliance  Diet: clear liquids Fluids: NS@75  DVT Prophylaxis: SQ Heparin  Code Status: FULL  Family Communication: yes  Disposition Plan: SNF  Time spent: 55 min

## 2017-01-18 NOTE — Progress Notes (Signed)
LCSW was consulted by Dr Judithann Sheen and medication non compliance has hindered this patients health and well being. Patient is his Own guardian and there is no HCPOA  LCSW introduced myself to patient and his Mother. Currently the patient lives with his brother and Mother. Patient gave verbal consent to speak to his family, doctors and [facility]. He does not wish to go to SNF or ALF or group Home and plans to return to his mothers home. His current supports he has a nurse who reviews his medicine 2x week and PT in home 2 times a week.   LCSW [facility]  Software engineer. LCSW requested patient have a care coordinator assigned. Information was shared and access will contact family or weekday SW number provided to ensure patient get additional help.  LCSW will complete assessment , patient will be admitted to [facility].  [place] LCSW [phone]

## 2017-01-19 ENCOUNTER — Other Ambulatory Visit: Payer: Self-pay

## 2017-01-19 ENCOUNTER — Other Ambulatory Visit
Admission: RE | Admit: 2017-01-19 | Discharge: 2017-01-19 | Disposition: A | Discharge status: Home / Self Care | Payer: Medicare Other | Source: Ambulatory Visit | Attending: Nurse Practitioner | Admitting: Nurse Practitioner

## 2017-01-19 ENCOUNTER — Other Ambulatory Visit: Payer: Self-pay | Admitting: Nurse Practitioner

## 2017-01-19 LAB — CBC
HEMATOCRIT: 29.7 % — AB (ref 40.0–52.0)
HEMOGLOBIN: 9.3 g/dL — AB (ref 13.0–18.0)
MCH: 25.2 pg — AB (ref 26.0–34.0)
MCHC: 31.2 g/dL — AB (ref 32.0–36.0)
MCV: 80.8 fL (ref 80.0–100.0)
Platelets: 116 10*3/uL — ABNORMAL LOW (ref 150–440)
RBC: 3.68 MIL/uL — ABNORMAL LOW (ref 4.40–5.90)
RDW: 19.5 % — ABNORMAL HIGH (ref 11.5–14.5)
WBC: 6.5 10*3/uL (ref 3.8–10.6)

## 2017-01-19 LAB — GLUCOSE, CAPILLARY
Glucose-Capillary: 97 mg/dL (ref 65–99)
Glucose-Capillary: 168 mg/dL — ABNORMAL HIGH (ref 65–99)

## 2017-01-19 LAB — PROTIME-INR
INR: 1.09
Prothrombin Time: 14.1 seconds (ref 11.4–15.2)

## 2017-01-19 LAB — LIPID PANEL
Cholesterol: 165 mg/dL (ref 0–200)
HDL: 26 mg/dL — ABNORMAL LOW (ref >40)
LDL Cholesterol: 121 mg/dL — ABNORMAL HIGH (ref 0–99)
Total CHOL/HDL Ratio: 6.3 RATIO
Triglycerides: 90 mg/dL (ref <150)
VLDL: 18 mg/dL (ref 0–40)

## 2017-01-19 LAB — COMPREHENSIVE METABOLIC PANEL
ALBUMIN: 3.1 g/dL — AB (ref 3.5–5.0)
ALT: 25 U/L (ref 17–63)
ANION GAP: 4 — AB (ref 5–15)
AST: 35 U/L (ref 15–41)
Alkaline Phosphatase: 111 U/L (ref 38–126)
BUN: 10 mg/dL (ref 6–20)
CALCIUM: 8.3 mg/dL — AB (ref 8.9–10.3)
CO2: 34 mmol/L — AB (ref 22–32)
Chloride: 102 mmol/L (ref 101–111)
Creatinine, Ser: 0.85 mg/dL (ref 0.61–1.24)
GFR calc Af Amer: >60 mL/min (ref >60)
GFR calc non Af Amer: >60 mL/min (ref >60)
GLUCOSE: 92 mg/dL (ref 65–99)
Potassium: 3.9 mmol/L (ref 3.5–5.1)
SODIUM: 140 mmol/L (ref 135–145)
Total Bilirubin: 0.7 mg/dL (ref 0.3–1.2)
Total Protein: 7.4 g/dL (ref 6.5–8.1)

## 2017-01-19 LAB — FERRITIN: Ferritin: 7 ng/mL — ABNORMAL LOW (ref 24–336)

## 2017-01-19 LAB — AMMONIA: Ammonia: 57 umol/L — ABNORMAL HIGH (ref 9–35)

## 2017-01-19 MED ORDER — IPRATROPIUM-ALBUTEROL 0.5-2.5 (3) MG/3ML IN SOLN: 3.0000 mL | RESPIRATORY_TRACT | Status: DC | PRN

## 2017-01-19 NOTE — Discharge Summary (Signed)
[facility] [place] [facility]   PATIENT NAME: Howard [name]    MR#:  [id number]  DATE OF BIRTH:  1981/09/09  DATE OF ADMISSION:  01/18/2017 ADMITTING PHYSICIAN: Marguarite Arbour, MD  DATE OF DISCHARGE: 01/19/2017  PRIMARY CARE PHYSICIAN: [name], NP    ADMISSION DIAGNOSIS:  Hepatic encephalopathy (HCC) [K72.90] Nausea [R11.0] Hypoxia [R09.02]  DISCHARGE DIAGNOSIS:  Hepatic Encephalopathy-recurrent  SECONDARY DIAGNOSIS:   Past Medical History:  Diagnosis Date  . Alcoholic cirrhosis of liver with ascites (HCC)   . Alcoholism (HCC)   . Anemia   . COPD (chronic obstructive pulmonary disease) (HCC)   . Depression   . Diabetes mellitus, type II (HCC)   . Esophageal varices (HCC)   . Heart disease    irregular heart beat (palpitations) and heart murmur  . Hyperlipemia   . Hypertension   . Liver disease   . Multiple thyroid nodules   . Portal hypertensive gastropathy (HCC)   . Schizophrenia (HCC)   . Seizures [facility])     HOSPITAL COURSE:  Howard [name] is a 36 y.o. male has a past medical history significant for cirrhosis and ascites with hx of recurrent encephalopathy now with worsening lethargy and nausea. In ER, ammonia level back up.  * Hepatic encephalopathy due to noncompliance with medications Patient had significantly elevated ammonia level at 100  He was restarted on his home dose of lactulose along with AcipHex). His ammonia trended down to 57. He does have chronic mildly elevated ammonia at baseline. Today he is back to his baseline with mental status. Alert and oriented 3. Counseled to be compliant with medications and target 3-4 bowel movements a day. He has verbalized understanding. Patient is being discharged home. All his medications remain unchanged. He needs to be compliant with medications. He has had recurrent admissions for the same problem.  Other comorbidities remain the same. Patient continues to abuse alcohol 2-3  times a week.  CONSULTS OBTAINED:    DRUG ALLERGIES:  No Known Allergies  DISCHARGE MEDICATIONS:   Current Discharge Medication List    CONTINUE these medications which have NOT CHANGED   Details  acamprosate (CAMPRAL) 333 MG tablet Take 2 tablets (666 mg total) by mouth 3 (three) times daily with meals. Qty: 180 tablet, Refills: 5    albuterol (PROVENTIL HFA;VENTOLIN HFA) 108 (90 Base) MCG/ACT inhaler Inhale 2 puffs into the lungs every 6 (six) hours as needed for wheezing or shortness of breath. Qty: 1 Inhaler, Refills: 2    citalopram (CELEXA) 20 MG tablet Take 1 tablet (20 mg total) by mouth every morning. Qty: 30 tablet, Refills: 5    furosemide (LASIX) 20 MG tablet Take 1 tablet by mouth daily.    haloperidol (HALDOL) 5 MG tablet Take 1 tablet (5 mg total) by mouth 2 (two) times daily. Qty: 60 tablet, Refills: 1    lactulose (CHRONULAC) 10 GM/15ML solution Take 45 mLs (30 g total) by mouth every 4 (four) hours. Qty: 240 mL, Refills: 0    levETIRAcetam (KEPPRA) 750 MG tablet Take 2 tablets (1,500 mg total) by mouth 2 (two) times daily. Qty: 120 tablet, Refills: 0    losartan (COZAAR) 25 MG tablet Take 25 mg by mouth daily.    metFORMIN (GLUCOPHAGE) 500 MG tablet Take 1 tablet (500 mg total) by mouth 2 (two) times daily with a meal. Qty: 60 tablet, Refills: 0    nadolol (CORGARD) 20 MG tablet Take 20 mg by mouth daily.  rifaximin (XIFAXAN) 550 MG TABS tablet Take 1 tablet (550 mg total) by mouth 2 (two) times daily. Qty: 60 tablet, Refills: 5    spironolactone (ALDACTONE) 50 MG tablet Take 1 tablet (50 mg total) by mouth 2 (two) times daily. Qty: 60 tablet, Refills: 5    sucralfate (CARAFATE) 1 g tablet Take 1 g by mouth 4 (four) times daily.    traZODone (DESYREL) 100 MG tablet Take 100 mg by mouth at bedtime as needed for sleep.     folic acid (FOLVITE) 1 MG tablet Take 1 tablet (1 mg total) by mouth daily. Qty: 30 tablet, Refills: 0    NON FORMULARY          If you experience worsening of your admission symptoms, develop shortness of breath, life threatening emergency, suicidal or homicidal thoughts you must seek medical attention immediately by calling 911 or calling your MD immediately  if symptoms less severe.  You Must read complete instructions/literature along with all the possible adverse reactions/side effects for all the Medicines you take and that have been prescribed to you. Take any new Medicines after you have completely understood and accept all the possible adverse reactions/side effects.   Please note  You were cared for by a hospitalist during your hospital stay. If you have any questions about your discharge medications or the care you received while you were in the hospital after you are discharged, you can call the unit and asked to speak with the hospitalist on call if the hospitalist that took care of you is not available. Once you are discharged, your primary care physician will handle any further medical issues. Please note that NO REFILLS for any discharge medications will be authorized once you are discharged, as it is imperative that you return to your primary care physician (or establish a relationship with a primary care physician if you do not have one) for your aftercare needs so that they can reassess your need for medications and monitor your lab values. Today   SUBJECTIVE   No new complaints. Pt tells me he forgets to take his meds VITAL SIGNS:  Blood pressure (!) 133/52, pulse 71, temperature 98.6 F (37 C), temperature source Axillary, resp. rate (!) 23, height  (1.905 m), weight (!) 158.4 kg (349 lb 3.2 oz), SpO2 90 %.  I/O:   Intake/Output Summary (Last 24 hours) at 01/19/17 0817 Last data filed at 01/19/17 0538  Gross per 24 hour  Intake          1308.75 ml  Output              375 ml  Net           933.75 ml    PHYSICAL EXAMINATION:  GENERAL:  36 y.o.-year-old patient lying in the bed with  no acute distress. Obese, disheveled EYES: Pupils equal, round, reactive to light and accommodation. No scleral icterus. Extraocular muscles intact.  HEENT: Head atraumatic, normocephalic. Oropharynx and nasopharynx clear.  NECK:  Supple, no jugular venous distention. No thyroid enlargement, no tenderness.  LUNGS: Normal breath sounds bilaterally, no wheezing, rales,rhonchi or crepitation. No use of accessory muscles of respiration.  CARDIOVASCULAR: S1, S2 normal. No murmurs, rubs, or gallops.  ABDOMEN: Soft, non-tender, non-distended. Bowel sounds present. No organomegaly or mass.  EXTREMITIES: No pedal edema, cyanosis, or clubbing.  NEUROLOGIC: Cranial nerves II through XII are intact. Muscle strength 5/5 in all extremities. Sensation intact. Gait not checked.  PSYCHIATRIC: patient is alert and oriented  x 3.  SKIN: No obvious rash, lesion, or ulcer.   DATA REVIEW:   CBC   Recent Labs Lab 01/19/17 0503  WBC 6.5  HGB 9.3*  HCT 29.7*  PLT 116*    Chemistries   Recent Labs Lab 01/19/17 0503  NA 140  K 3.9  CL 102  CO2 34*  GLUCOSE 92  BUN 10  CREATININE 0.85  CALCIUM 8.3*  AST 35  ALT 25  ALKPHOS 111  BILITOT 0.7    Microbiology Results   No results found for this or any previous visit (from the past 240 hour(s)).  RADIOLOGY:  Dg Chest Port 1 View  Result Date: 01/18/2017 CLINICAL DATA:  Hypoxia EXAM: PORTABLE CHEST 1 VIEW COMPARISON:  12/29/2016 FINDINGS: The heart size and mediastinal contours are within normal limits. Both lungs are clear. The visualized skeletal structures are unremarkable. IMPRESSION: No active disease. Electronically Signed   By: Elige Ko   On: 01/18/2017 15:27     Management plans discussed with the patient, family and they are in agreement.  CODE STATUS:     Code Status Orders        Start     Ordered   01/18/17 1636  Full code  Continuous     01/18/17 1635    Code Status History    Date Active Date Inactive Code Status  Order ID Comments User Context   01/05/2017  2:57 PM 01/05/2017  9:46 PM Full Code [id number]  Arnaldo Natal, MD ED   12/30/2016  2:51 AM 01/01/2017  2:33 PM Full Code [id number]  Oralia Manis, MD Inpatient   12/18/2016 12:25 AM 12/19/2016  9:28 PM Full Code [id number]  Katharina Caper, MD Inpatient   12/12/2016 11:40 PM 12/15/2016  8:28 PM Full Code [id number]  Arnaldo Natal, MD Inpatient   11/24/2016  1:11 AM 11/25/2016  4:42 PM Full Code [id number]  Tonye Royalty, DO Inpatient   11/12/2016  3:36 PM 11/14/2016  2:36 PM Full Code [id number]  Auburn Bilberry, MD Inpatient   10/04/2016  6:04 PM 10/08/2016  3:44 PM Full Code [id number]  Shaune Pollack, MD Inpatient   09/22/2016  2:29 AM 09/23/2016  6:38 PM Full Code [id number]  Oralia Manis, MD Inpatient   08/07/2016  7:37 PM 08/12/2016  8:51 PM Full Code [id number]  Gracelyn Nurse, MD Inpatient   07/07/2016  1:40 AM 07/09/2016  6:29 PM Full Code [id number]  Ihor Austin, MD Inpatient   06/10/2016  6:21 AM 06/11/2016  8:25 PM Full Code [id number]  Ihor Austin, MD Inpatient   05/12/2016  3:55 AM 05/15/2016  2:57 PM Full Code [id number]  Ihor Austin, MD Inpatient   04/28/2016  2:54 AM 04/29/2016  6:23 PM Full Code [id number]  Tonye Royalty, DO Inpatient   03/14/2016 12:30 PM 03/16/2016  3:41 PM Full Code [id number]  Shaune Pollack, MD Inpatient   03/03/2016 12:13 PM 03/06/2016  2:26 PM Full Code [id number]  Milagros Loll, MD ED   02/10/2016  5:54 PM 02/12/2016  7:34 PM Full Code [id number]  Ramonita Lab, MD Inpatient   11/11/2015  5:16 AM 11/13/2015  6:00 PM Full Code [id number]  Ihor Austin, MD Inpatient   11/07/2015 10:02 PM 11/09/2015  4:56 PM Full Code [id number]  Enid Baas, MD Inpatient   10/28/2015  8:14 PM 10/29/2015  2:26 PM Full Code [id number]  Altamese Dilling, MD Inpatient   10/16/2015 11:47 PM 10/17/2015  6:00 PM Full Code [id number]  Oralia Manis, MD Inpatient  07/30/2015  5:35 PM 08/04/2015  8:34 PM Full Code [id number]  Enedina Finner, MD Inpatient   07/05/2015  1:16 AM 07/08/2015   7:22 PM Full Code [id number]  Oralia Manis, MD Inpatient   05/30/2015  7:28 PM 06/01/2015  3:36 PM Full Code [id number]  Audery Amel, MD Inpatient      TOTAL TIME TAKING CARE OF THIS PATIENT: *40* minutes.    Junious Ragone M.D on 01/19/2017 at 8:17 AM  Between 7am to 6pm - Pager - [phone] After 6pm go to [email or web address] - Social research officer, government  [facility]  Office  [phone]  CC: Primary care physician; [name], NP

## 2017-01-19 NOTE — Progress Notes (Signed)
Discharge instructions given and went over with patient at bedside. Follow-up appointments reviewed. Importance of medication compliance discussed with patient. Patient verbalized understanding. Patient to discharge home with resumption of home health. Awaiting mom to provide transportation. Bo Mcclintock, RN

## 2017-01-19 NOTE — Evaluation (Signed)
Physical Therapy Evaluation Patient Details Name: Howard [name] MRN: [id number] DOB: [birth year] Today's Date: 01/19/2017   History of Present Illness  Admitted for heptatic encephalopathy with complaints of nausea and lethargy. Pt recently admitted for similar symptoms. HIstory includes seizures, hepatic encephalopathy, alcoholic cirrhosis, COPD, depresssion, and DM. Pt wears 2L of O2 at baseline.  Clinical Impression  Pt is a pleasant 36 year old male who was admitted for hepatic encephalopathy.  Pt demonstrates all bed mobility/transfers/ambulation at baseline level. Pt does not require any further PT needs at this time. Pt will be dc in house and does not require follow up. RN aware. Will dc current orders. Recommend to continue HHPT.      Follow Up Recommendations Home health PT    Equipment Recommendations  None recommended by PT    Recommendations for Other Services       Precautions / Restrictions Precautions Precautions: Fall      Mobility  Bed Mobility Overal bed mobility: Modified Independent Bed Mobility: Supine to Sit     Supine to sit: Modified independent (Device/Increase time)     General bed mobility comments: safe technique using railing. Ease of transfer noted  Transfers Overall transfer level: Modified independent Equipment used: Rolling walker (2 wheeled) Transfers: Sit to/from Stand Sit to Stand: Modified independent (Device/Increase time)         General transfer comment: BRW used for mobility with safe technique. All mobility performed on 2L of O2.  Ambulation/Gait Ambulation/Gait assistance: Modified independent (Device/Increase time) Ambulation Distance (Feet): 200 Feet Assistive device: Rolling walker (2 wheeled) Gait Pattern/deviations: Step-through pattern     General Gait Details: ambulated with good cadence in hallway using reciprocal gait pattern. Minimal fatigue noted. O2 sats at 93% with mobility. Good endurance noted  Stairs            Wheelchair Mobility    Modified Rankin (Stroke Patients Only)       Balance Overall balance assessment: Modified Independent                                           Pertinent Vitals/Pain Pain Assessment: No/denies pain    Home Living Family/patient expects to be discharged to:: Private residence Living Arrangements: Parent Available Help at Discharge: Available 24 hours/day;Family Type of Home: House Home Access: Stairs to enter Entrance Stairs-Rails: Psychiatric nurse of Steps: 3 Home Layout: One level Home Equipment: Environmental consultant - 2 wheels;Cane - quad;Shower seat      Prior Function Level of Independence: Independent with assistive device(s)         Comments: Mod I with ambulation using RW, has been receiving HHPT at this time     Hand Dominance   Dominant Hand: Right    Extremity/Trunk Assessment   Upper Extremity Assessment Upper Extremity Assessment: Overall WFL for tasks assessed    Lower Extremity Assessment Lower Extremity Assessment: Overall WFL for tasks assessed       Communication   Communication: No difficulties  Cognition Arousal/Alertness: Awake/alert Behavior During Therapy: WFL for tasks assessed/performed Overall Cognitive Status: Within Functional Limits for tasks assessed                                        General Comments      Exercises Other Exercises  Other Exercises: Pt ambulated to bathroom for BM. Able to control descent down to toilet. Safe technique performed and pt able to wash hands at sink without LOB noted.   Assessment/Plan    PT Assessment All further PT needs can be met in the next venue of care  PT Problem List Decreased strength;Decreased balance       PT Treatment Interventions      PT Goals (Current goals can be found in the Care Plan section)  Acute Rehab PT Goals Patient Stated Goal: be more active PT Goal Formulation: All assessment  and education complete, DC therapy Time For Goal Achievement: 01/19/17 Potential to Achieve Goals: Good    Frequency     Barriers to discharge        Co-evaluation               AM-PAC PT "6 Clicks" Daily Activity  Outcome Measure Difficulty turning over in bed (including adjusting bedclothes, sheets and blankets)?: None Difficulty moving from lying on back to sitting on the side of the bed? : None Difficulty sitting down on and standing up from a chair with arms (e.g., wheelchair, bedside commode, etc,.)?: None Help needed moving to and from a bed to chair (including a wheelchair)?: None Help needed walking in hospital room?: None Help needed climbing 3-5 steps with a railing? : A Little 6 Click Score: 23    End of Session Equipment Utilized During Treatment: Gait belt;Oxygen Activity Tolerance: Patient tolerated treatment well Patient left: in chair;with chair alarm set Nurse Communication: Mobility status PT Visit Diagnosis: Muscle weakness (generalized) (M62.81);History of falling (Z91.81);Difficulty in walking, not elsewhere classified (R26.2)    Time: 9483-4758 PT Time Calculation (min) (ACUTE ONLY): 18 min   Charges:   PT Evaluation $PT Eval Low Complexity: 1 Procedure PT Treatments $Therapeutic Activity: 8-22 mins   PT G Codes:        Greggory Stallion, PT, DPT [phone]   Ewell Benassi 01/19/2017, 11:51 AM

## 2017-01-19 NOTE — Progress Notes (Signed)
Patient discharged to home with mom via wheelchair by nursing staff. Bo Mcclintock, RN

## 2017-01-20 ENCOUNTER — Other Ambulatory Visit: Payer: Self-pay | Admitting: Nurse Practitioner

## 2017-01-20 MED ORDER — FERROUS SULFATE 325 (65 FE) MG PO TABS: 975.0000 mg | ORAL_TABLET | ORAL | 5 refills | Status: DC

## 2017-01-27 ENCOUNTER — Encounter: Payer: Self-pay | Admitting: Psychiatry

## 2017-01-27 ENCOUNTER — Ambulatory Visit (INDEPENDENT_AMBULATORY_CARE_PROVIDER_SITE_OTHER): Payer: Medicare Other | Admitting: Psychiatry

## 2017-01-27 VITALS — BP 118/71 | HR 75 | Temp 98.2°F | Wt 351.2 lb

## 2017-01-27 DIAGNOSIS — F200 Paranoid schizophrenia: Secondary | ICD-10-CM

## 2017-01-27 MED ORDER — LURASIDONE HCL 40 MG PO TABS: 40.0000 mg | ORAL_TABLET | Freq: Every day | ORAL | Status: DC

## 2017-01-27 NOTE — Progress Notes (Signed)
Follow-up for this 36 year old man with schizophrenia and liver failure from alcohol abuse. Patient tells me he is still feeling bad emotionally. The voices are still present especially when he gets stressed out. In the time since I saw him last in the office he had another hospitalization for his hepatic failure. Denies having any suicidal intent but says the voices still try to get him to cut himself. He is compliant with his Haldol. He says he is having a little more shaking and tremoring although he is also pleased that he has lost some weight.  Casually dressed gentleman. Blunt affect. A little worse than usual. No current suicidal ideation. Has insight into his hallucinations.  At least he has stayed sober for a few days in a row. Encouraged him and did some supportive counseling around that. I gave him some samples to add Latuda 40 mg at night to his current Haldol to try and help with the hallucinations and mood. I will see him back in another week or so.

## 2017-01-28 ENCOUNTER — Telehealth: Payer: Self-pay

## 2017-01-28 NOTE — Telephone Encounter (Signed)
received a fax requesting a refill on  levetiracetam 780mg  #120 pt was seen on 01-27-17 and has an appt for next week

## 2017-01-29 ENCOUNTER — Ambulatory Visit (INDEPENDENT_AMBULATORY_CARE_PROVIDER_SITE_OTHER): Payer: Medicare Other | Admitting: Nurse Practitioner

## 2017-01-29 ENCOUNTER — Encounter: Payer: Self-pay | Admitting: Nurse Practitioner

## 2017-01-29 VITALS — BP 126/64 | HR 80 | Temp 97.8°F | Resp 16 | Wt 351.5 lb

## 2017-01-29 DIAGNOSIS — D500 Iron deficiency anemia secondary to blood loss (chronic): Secondary | ICD-10-CM | POA: Diagnosis not present

## 2017-01-29 DIAGNOSIS — K7682 Hepatic encephalopathy: Secondary | ICD-10-CM

## 2017-01-29 DIAGNOSIS — F1010 Alcohol abuse, uncomplicated: Secondary | ICD-10-CM

## 2017-01-29 DIAGNOSIS — K7290 Hepatic failure, unspecified without coma: Secondary | ICD-10-CM | POA: Diagnosis not present

## 2017-01-29 NOTE — Patient Instructions (Addendum)
Howard [name], Thank you for coming in to clinic today.  1. You have done a great job with 4 days off alcohol.  Each day is a success!  Take it one day at a time and work toward getting your 1 month chip.  2. Continue taking all doses of your lactulose.  These are very important for keeping you out of the hospital.  3.  Hematology (Blood doctor) will call you with an appointment for IV iron replacement.  This will be at the Cancer center.  You do not have a cancer diagnosis, this is just the location of this doctor.  [facility] at [facility] (HEMATOLOGY DEPT) [address] [place] [phone]   4. We are also contacting palliative care to help you manage your symptoms of liver failure.  [facility] [place] [address] [place], [place] [address] Ph [phone] [email or web address]  Please schedule a follow-up appointment with [name], AGNP to Return in about 4 weeks (around 02/26/2017) for liver failure.  If you have any other questions or concerns, please feel free to call the clinic or send a message through MyChart. You may also schedule an earlier appointment if necessary.  [name], DNP, AGNP-BC Adult Gerontology Nurse Practitioner [facility], [facility]    Liver Failure Liver failure is a condition in which the liver loses its ability to function due to injury or disease. The liver is a large organ in the upper right-hand side of the abdomen. It is involved in many important functions, including storing energy, producing fluids that the body needs, and removing harmful substances from the bloodstream. Liver failure can develop quickly, over days or weeks (acute liver failure). It can also develop gradually, over months or years (chronicliver failure). What are the causes? There are many possible causes of liver failure, including:  Liver infection (viral hepatitis).  Alcohol abuse.  An overdose of certain  medicines. Acetaminophen overdose is a common cause.  Liver disease.  Ingesting poison from mushrooms or mold. What are the signs or symptoms? Symptoms of this condition may include:  Yellowing of the skin and the whites of the eyes (jaundice).  Bruising easily.  Persistent bleeding.  Itchy skin (pruritus).  Red, spider-like lines on the skin.  Loss of appetite.  Nausea.  Vomiting blood.  Bloody bowel movements.  Weight loss.  Muscle loss.  Jerky or floppy muscle movements, especially with hand movements (asterixis).  Fluid buildup in the belly (ascites).  Decreased urination. This may be a sign that your kidneys have stopped working (renal failure).  Confusion and sleepiness.  Difficulty sleeping.  Mood and personality changes.  Frequent infections.  Breath that smells sweet or musty.  Difficulty breathing. How is this diagnosed? This condition is diagnosed based on your symptoms, your medical history, and a physical exam. You may have tests, including:  Blood tests.  CT scan.  MRI.  Ultrasound.  Removal of a small amount of liver tissue to be examined under a microscope (biopsy). How is this treated? Treatment depends on the cause and severity of your condition. Treatment for chronic liver failure may include:  Medicines to help reduce symptoms.  Lifestyle changes, such as limiting salt and animal proteins in your diet. Foods that contain animal proteins include red meat, fish, and dairy products. Acute or advanced (end stage) liver failure may require hospitalization. Treatment may include:  Antibiotic medicine.  IV fluids that contain sugar (glucose) and minerals (electrolytes).  Flushing out toxic substances  from the body using medicine (lactulose) or a cleansing procedure (enema).  Adding certain amounts of the liquid part of blood (plasma) to your bloodstream (receiving a transfusion).  Using an artificial kidney to filter your blood  (hemodialysis) if you have renal failure.  Breathing support and a breathing tube (respirator).  Liver transplant. This is a surgery to replace your liver with another person's liver (donor liver). This may be the best option if your liver has completely stopped functioning. Follow these instructions at home:  Take over-the-counter and prescription medicines only as told by your health care provider.  Do not drink alcohol.  Do not use tobacco products, including cigarettes, chewing tobacco, or e-cigarettes. If you need help quitting, ask your health care provider.  Follow instructions from your health care provider about eating and drinking restrictions. This may include:  Limiting the amount of animal protein that you eat.  Increasing the amount of plant-based protein that you eat. Foods that contain plant-based proteins include whole grains, nuts, and vegetables.  Taking vitamin supplements.  Limiting the amount of salt that you eat.  Follow instructions from your health care provider about maintaining your vaccinations, especially vaccinations against hepatitis A and B.  Exercise regularly, as told by your health care provider.  Keep all follow-up visits as told by your health care provider. This is important. Contact a health care provider if:  You have symptoms that get worse.  You lose a lot of weight without trying.  You have a fever or chills. Get help right away if:  You become confused or very sleepy.  You cannot take care of yourself or be taken care of at home.  You are not urinating.  You have difficulty breathing.  You vomit blood. This information is not intended to replace advice given to you by your health care provider. Make sure you discuss any questions you have with your health care provider. Document Released: 05/30/2015 Document Revised: 02/14/2016 Document Reviewed: 09/27/2014 Elsevier Interactive Patient Education  2017 [employer]

## 2017-01-29 NOTE — Progress Notes (Signed)
Subjective:    Patient ID: Howard [name], male    DOB: [birth year], 36 y.o.   MRN: [id number]  Howard [name] is a 36 y.o. male presenting on 01/29/2017 for Hospitalization Follow-up (Patient here today to fu from overnight hospital admission at [facility] from 01/18/17. )   HPI Hospital Follow-up: Hepatic Encephalopathy Pt was hospitalized on 4/29 for recurrent hepatic encephalopathy secondary to medication nonadherence and an ammonia level of 100.  Pt had forgotten to take his lactulose prior to admission.  Discharged on 4/30 with ammonia level at baseline and instructed to have 3-4 BM per day.   Today pt states he realized how serious his recent hospitalization was because they suggested SNF placement for medications.  He doesn't want to have to go to SNF for medication adherence, so he has been taking lactulose.  Worsening tremor noted by [name] at last visit on 5/8.  He states he had nausea with vomiting and couldn't take 2 doses of lactulose for the one day prior to this appointment.  Reports taking all doses of lactulose other than these two.  Also takes all pillpacks and is noticing a green pill in the packs for MWF that are consistent with adding ferritin sulfate supplementation.  Alcoholism Recent visit to [name] 5/8 reported to have several days sober. Today pt reports drinking 24 oz beer 2 times per week now and that he is slowly cutting back.  His last drink was about 4 days ago.  NO current desire to drink.  Becoming "stressed out" would make him drink again.  No alcohol at home right now.  Has been stressed in recent days but no alcohol consumption.  He recently had a stressor for his insurance on his moped and was able to complete this without using alcohol.  He has been to AA in past and has a 1 day sobriety chip.  He expresses desire to attend AA meetings to help with completely quitting alcohol.  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  Pt still having pica, salt cravings.  However, pt states they are slightly improved.  Taking ferrous sulfate 325 mg every M, W, F in pill-packs provided by his pharmacy.  He states he has noticed a new green pill on those days.  Denies s/sx of bleeding, black/tarry stool, bright red blood per rectum, coffee ground emesis.  Schizophrenia He denies need to cut himself since our last visit.  Feels better overall.  Continues treatment and medication management by [name] at [facility].  Social History  Substance Use Topics  . Smoking status: Never Smoker  . Smokeless tobacco: Never Used  . Alcohol use 3.6 oz/week    6 Cans of beer per week     Comment: "one quart" 2-3 times per week    Review of Systems  HENT: Positive for nosebleeds.        One nosebleed the day he had n/v last week  Gastrointestinal: Negative.  Negative for blood in stool, constipation, nausea and vomiting.   Per HPI unless specifically indicated above     Objective:    BP 126/64 (BP Location: Left Arm, Patient Position: Sitting, Cuff Size: Large)   Pulse 80   Temp 97.8 F (36.6 C) (Oral)   Resp 16   Wt (!) 351 lb 8 oz (159.4 kg)   SpO2 98%   BMI 43.93 kg/m   Wt Readings from Last 3 Encounters:  01/29/17 (!) 351 lb 8 oz (159.4 kg)  01/27/17 Marland Kitchen)  351 lb 3.2 oz (159.3 kg)  01/19/17 (!) 349 lb 3.2 oz (158.4 kg)    Physical Exam  Constitutional: He is oriented to person, place, and time. He appears well-developed and well-nourished. No distress.  HENT:  Head: Normocephalic and atraumatic.  Eyes: Scleral icterus is present.  minimal  Cardiovascular: Normal rate, regular rhythm, normal heart sounds and intact distal pulses.   Pulmonary/Chest: Breath sounds normal. No respiratory distress. He has no wheezes.  Abdominal: Soft. He exhibits ascites. He exhibits no fluid wave. Bowel sounds are increased. There is no tenderness.  Musculoskeletal:  +1 pitting edema BLE  Neurological: He is alert and oriented to person, place, and time. He displays tremor.    Tremor significantly less than last visit.  Mild tremor at rest not visible to naked eye. Active tremor visible to naked eye limited to fingers not whole hand  Skin: Skin is warm and dry.  Jaundice - less than last vist Old cutting scars: no new lesions bilat upper arms near elbows.  Psychiatric: His behavior is normal. Thought content normal. He expresses no homicidal and no suicidal ideation. He expresses no suicidal plans and no homicidal plans.  Normal mood, semi-flat affect  Vitals reviewed.     Results for orders placed or performed during the hospital encounter of 01/19/17  Lipid panel  Result Value Ref Range   Cholesterol 165 0 - 200 mg/dL   Triglycerides 90 <161<150 mg/dL   HDL 26 (L) >09>40 mg/dL   Total CHOL/HDL Ratio 6.3 RATIO   VLDL 18 0 - 40 mg/dL   LDL Cholesterol 604121 (H) 0 - 99 mg/dL  Ferritin  Result Value Ref Range   Ferritin 7 (L) 24 - 336 ng/mL      Assessment & Plan:   Problem List Items Addressed This Visit      Nervous and Auditory   Hepatic encephalopathy (HCC) - Primary Acute worsening r/t missed doses lactulose requiring hospitalization 01/18/17 now resolved.  Plan: 1. Discussed importance of lactulose to hepatic encephalopathy.  Discussed Dr. Toni Amendlapacs visit, missed doses, and tremor.  Explained tremor - that it is a s/sx of increasing ammonia. 2. Educated patient about palliative care in relationship to liver failure.  Pt agrees to get connected to palliative care. 3. Iron deficiency anemia present.  Pica persists and pt feels fatigue.  Proceed with IV iron replacement and referral to hematology. 4. Follow up 4 weeks for med adherence check    Relevant Orders   Amb Referral to Palliative Care   Ambulatory referral to Hematology     Other   Alcohol abuse Ongoing recovery process.  4 days sober and patient states desire to completely quit.  Plan: 1. Discussed utilizing AA meetings. 2. Discussed a positive outlook and "I can stay away from alcohol"  statements rather than "I might be able to." 3. Celebrated 4 days sober and encouraged a day-to-day approach to staying sober 4. Set goal to exchange is 1-day sober chip for a 30 day chip as the next step.  Pt smiled in response and stated "Yeah, I'd like to do that."    Anemia Chronic r/t chronic disease and acute blood loss associated with past esophageal varices. Not improving.  Last labs in hospitalization.    Plan: 1. No evidence of bleeding except occasional epistaxis. 2. Proceed with IV iron replacement as above.   Relevant Orders   Ambulatory referral to Hematology      No orders of the defined types were placed in  this encounter.     Follow up plan: Return in about 4 weeks (around 02/26/2017) for liver failure.   [name], DNP, AGPCNP-BC Adult Gerontology Primary Care Nurse Practitioner [facility] 01/30/2017, 8:05 AM

## 2017-01-29 NOTE — Telephone Encounter (Signed)
That is not a medicine that I prescribe for him.

## 2017-01-30 NOTE — Progress Notes (Signed)
I have reviewed this encounter including the documentation in this note and/or discussed this patient with the provider, Wilhelmina McardleLauren [name], AGPCNP-BC. I am certifying that I agree with the content of this note as supervising physician.  Saralyn PilarAlexander Kevork Joyce, DO [facility] 01/30/2017, 11:24 PM

## 2017-02-02 NOTE — Telephone Encounter (Signed)
Pharmacy notified that they will need to contact pcp.

## 2017-02-03 ENCOUNTER — Ambulatory Visit (INDEPENDENT_AMBULATORY_CARE_PROVIDER_SITE_OTHER): Payer: Medicare Other | Admitting: Psychiatry

## 2017-02-03 ENCOUNTER — Emergency Department
Admission: EM | Admit: 2017-02-03 | Discharge: 2017-02-04 | Disposition: A | Discharge status: Home / Self Care | Payer: Medicare Other | Attending: Emergency Medicine | Admitting: Emergency Medicine

## 2017-02-03 ENCOUNTER — Encounter: Payer: Self-pay | Admitting: Emergency Medicine

## 2017-02-03 ENCOUNTER — Encounter: Payer: Self-pay | Admitting: Psychiatry

## 2017-02-03 VITALS — BP 111/71 | HR 84 | Temp 97.8°F | Wt 352.5 lb

## 2017-02-03 DIAGNOSIS — I10 Essential (primary) hypertension: Secondary | ICD-10-CM | POA: Diagnosis not present

## 2017-02-03 DIAGNOSIS — F200 Paranoid schizophrenia: Secondary | ICD-10-CM

## 2017-02-03 DIAGNOSIS — R443 Hallucinations, unspecified: Secondary | ICD-10-CM | POA: Diagnosis present

## 2017-02-03 DIAGNOSIS — F1020 Alcohol dependence, uncomplicated: Secondary | ICD-10-CM | POA: Diagnosis present

## 2017-02-03 DIAGNOSIS — E119 Type 2 diabetes mellitus without complications: Secondary | ICD-10-CM | POA: Insufficient documentation

## 2017-02-03 DIAGNOSIS — Z7984 Long term (current) use of oral hypoglycemic drugs: Secondary | ICD-10-CM | POA: Diagnosis not present

## 2017-02-03 DIAGNOSIS — Z79899 Other long term (current) drug therapy: Secondary | ICD-10-CM | POA: Insufficient documentation

## 2017-02-03 DIAGNOSIS — R440 Auditory hallucinations: Secondary | ICD-10-CM

## 2017-02-03 DIAGNOSIS — E7800 Pure hypercholesterolemia, unspecified: Secondary | ICD-10-CM | POA: Diagnosis present

## 2017-02-03 DIAGNOSIS — K7031 Alcoholic cirrhosis of liver with ascites: Secondary | ICD-10-CM | POA: Diagnosis present

## 2017-02-03 DIAGNOSIS — J449 Chronic obstructive pulmonary disease, unspecified: Secondary | ICD-10-CM | POA: Diagnosis not present

## 2017-02-03 LAB — CBC
HCT: 34.6 % — ABNORMAL LOW (ref 40.0–52.0)
Hemoglobin: 10.8 g/dL — ABNORMAL LOW (ref 13.0–18.0)
MCH: 25.9 pg — AB (ref 26.0–34.0)
MCHC: 31.3 g/dL — ABNORMAL LOW (ref 32.0–36.0)
MCV: 82.6 fL (ref 80.0–100.0)
PLATELETS: 121 10*3/uL — AB (ref 150–440)
RBC: 4.19 MIL/uL — AB (ref 4.40–5.90)
RDW: 21.5 % — ABNORMAL HIGH (ref 11.5–14.5)
WBC: 6.9 10*3/uL (ref 3.8–10.6)

## 2017-02-03 LAB — COMPREHENSIVE METABOLIC PANEL
ALT: 29 U/L (ref 17–63)
ANION GAP: 9 (ref 5–15)
AST: 52 U/L — ABNORMAL HIGH (ref 15–41)
Albumin: 3.6 g/dL (ref 3.5–5.0)
Alkaline Phosphatase: 147 U/L — ABNORMAL HIGH (ref 38–126)
BUN: 7 mg/dL (ref 6–20)
CO2: 31 mmol/L (ref 22–32)
Calcium: 9.2 mg/dL (ref 8.9–10.3)
Chloride: 99 mmol/L — ABNORMAL LOW (ref 101–111)
Creatinine, Ser: 1.01 mg/dL (ref 0.61–1.24)
Glucose, Bld: 142 mg/dL — ABNORMAL HIGH (ref 65–99)
POTASSIUM: 4.1 mmol/L (ref 3.5–5.1)
Sodium: 139 mmol/L (ref 135–145)
Total Bilirubin: 0.8 mg/dL (ref 0.3–1.2)
Total Protein: 8.1 g/dL (ref 6.5–8.1)

## 2017-02-03 LAB — ETHANOL

## 2017-02-03 LAB — URINE DRUG SCREEN, QUALITATIVE (ARMC ONLY)
Amphetamines, Ur Screen: NOT DETECTED
Barbiturates, Ur Screen: NOT DETECTED
Benzodiazepine, Ur Scrn: NOT DETECTED
CANNABINOID 50 NG, UR ~~LOC~~: NOT DETECTED
Cocaine Metabolite,Ur ~~LOC~~: NOT DETECTED
MDMA (ECSTASY) UR SCREEN: NOT DETECTED
Methadone Scn, Ur: NOT DETECTED
Opiate, Ur Screen: NOT DETECTED
Phencyclidine (PCP) Ur S: NOT DETECTED
TRICYCLIC, UR SCREEN: NOT DETECTED

## 2017-02-03 LAB — AMMONIA: AMMONIA: 62 umol/L — AB (ref 9–35)

## 2017-02-03 LAB — ACETAMINOPHEN LEVEL: Acetaminophen (Tylenol), Serum: <10 ug/mL — ABNORMAL LOW (ref 10–30)

## 2017-02-03 LAB — SALICYLATE LEVEL

## 2017-02-03 MED ORDER — VITAMIN B-1 100 MG PO TABS
100.0000 mg | ORAL_TABLET | Freq: Every day | ORAL | Status: DC
  Administered 2017-02-03 – 2017-02-04 (×2): 100 mg via ORAL
  Filled 2017-02-03 (×2): qty 1

## 2017-02-03 MED ORDER — LORAZEPAM 2 MG/ML IJ SOLN: 1.0000 mg | Freq: Four times a day (QID) | INTRAMUSCULAR | Status: DC | PRN

## 2017-02-03 MED ORDER — LURASIDONE HCL 80 MG PO TABS
80.0000 mg | ORAL_TABLET | Freq: Every day | ORAL | Status: DC
  Administered 2017-02-03 – 2017-02-04 (×2): 80 mg via ORAL
  Filled 2017-02-03 (×2): qty 1

## 2017-02-03 MED ORDER — LOSARTAN POTASSIUM 50 MG PO TABS
25.0000 mg | ORAL_TABLET | Freq: Every day | ORAL | Status: DC
  Administered 2017-02-03 – 2017-02-04 (×2): 25 mg via ORAL
  Filled 2017-02-03 (×2): qty 1

## 2017-02-03 MED ORDER — FOLIC ACID 1 MG PO TABS
1.0000 mg | ORAL_TABLET | Freq: Every day | ORAL | Status: DC
  Administered 2017-02-03 – 2017-02-04 (×2): 1 mg via ORAL
  Filled 2017-02-03 (×2): qty 1

## 2017-02-03 MED ORDER — NADOLOL 20 MG PO TABS
20.0000 mg | ORAL_TABLET | Freq: Every day | ORAL | Status: DC
  Administered 2017-02-04: 20 mg via ORAL
  Filled 2017-02-03 (×2): qty 1

## 2017-02-03 MED ORDER — RIFAXIMIN 550 MG PO TABS
550.0000 mg | ORAL_TABLET | Freq: Two times a day (BID) | ORAL | Status: DC
  Administered 2017-02-04 (×2): 550 mg via ORAL
  Filled 2017-02-03 (×4): qty 1

## 2017-02-03 MED ORDER — THIAMINE HCL 100 MG/ML IJ SOLN
100.0000 mg | Freq: Every day | INTRAMUSCULAR | Status: DC
  Filled 2017-02-03: qty 2

## 2017-02-03 MED ORDER — LEVETIRACETAM 500 MG PO TABS
1500.0000 mg | ORAL_TABLET | Freq: Two times a day (BID) | ORAL | Status: DC
  Administered 2017-02-04 (×2): 1500 mg via ORAL
  Filled 2017-02-03 (×3): qty 3

## 2017-02-03 MED ORDER — SUCRALFATE 1 G PO TABS
1.0000 g | ORAL_TABLET | Freq: Three times a day (TID) | ORAL | Status: DC
  Administered 2017-02-03 – 2017-02-04 (×5): 1 g via ORAL
  Filled 2017-02-03 (×5): qty 1

## 2017-02-03 MED ORDER — ALBUTEROL SULFATE HFA 108 (90 BASE) MCG/ACT IN AERS
2.0000 | INHALATION_SPRAY | RESPIRATORY_TRACT | Status: DC | PRN
  Filled 2017-02-03: qty 6.7

## 2017-02-03 MED ORDER — TRAZODONE HCL 100 MG PO TABS
100.0000 mg | ORAL_TABLET | Freq: Every day | ORAL | Status: DC
  Administered 2017-02-04: 100 mg via ORAL
  Filled 2017-02-03: qty 1

## 2017-02-03 MED ORDER — LACTULOSE 10 GM/15ML PO SOLN
45.0000 g | Freq: Three times a day (TID) | ORAL | Status: DC
  Administered 2017-02-03 – 2017-02-04 (×3): 45 g via ORAL
  Filled 2017-02-03 (×4): qty 90

## 2017-02-03 MED ORDER — FUROSEMIDE 40 MG PO TABS
20.0000 mg | ORAL_TABLET | Freq: Every day | ORAL | Status: DC
  Administered 2017-02-03 – 2017-02-04 (×2): 20 mg via ORAL
  Filled 2017-02-03: qty 2
  Filled 2017-02-03: qty 1

## 2017-02-03 MED ORDER — ADULT MULTIVITAMIN W/MINERALS CH
1.0000 | ORAL_TABLET | Freq: Every day | ORAL | Status: DC
  Administered 2017-02-03 – 2017-02-04 (×2): 1 via ORAL
  Filled 2017-02-03 (×2): qty 1

## 2017-02-03 MED ORDER — ACAMPROSATE CALCIUM 333 MG PO TBEC
666.0000 mg | DELAYED_RELEASE_TABLET | Freq: Three times a day (TID) | ORAL | Status: DC
  Administered 2017-02-04 (×3): 666 mg via ORAL
  Filled 2017-02-03 (×4): qty 2

## 2017-02-03 MED ORDER — CITALOPRAM HYDROBROMIDE 20 MG PO TABS
20.0000 mg | ORAL_TABLET | Freq: Every day | ORAL | Status: DC
  Administered 2017-02-03 – 2017-02-04 (×2): 20 mg via ORAL
  Filled 2017-02-03 (×2): qty 1

## 2017-02-03 MED ORDER — LORAZEPAM 1 MG PO TABS
1.0000 mg | ORAL_TABLET | Freq: Four times a day (QID) | ORAL | Status: DC | PRN
  Administered 2017-02-03: 1 mg via ORAL
  Filled 2017-02-03: qty 1

## 2017-02-03 MED ORDER — SPIRONOLACTONE 25 MG PO TABS
50.0000 mg | ORAL_TABLET | Freq: Two times a day (BID) | ORAL | Status: DC
  Administered 2017-02-04 (×2): 50 mg via ORAL
  Filled 2017-02-03 (×4): qty 2

## 2017-02-03 MED ORDER — METFORMIN HCL 500 MG PO TABS
500.0000 mg | ORAL_TABLET | Freq: Two times a day (BID) | ORAL | Status: DC
  Administered 2017-02-03 – 2017-02-04 (×3): 500 mg via ORAL
  Filled 2017-02-03 (×3): qty 1

## 2017-02-03 NOTE — ED Notes (Signed)
Waiting for meds from pharmacy at thsi time.

## 2017-02-03 NOTE — Consult Note (Signed)
[facility] Consult   Reason for Consult:  Consult for 36 year old man with a history of schizophrenia who came voluntarily into the emergency room because of worsening psychosis Referring Physician:  [place] Patient Identification: Howard [name] MRN:  [id number] Principal Diagnosis: Paranoid schizophrenia ([place]) Diagnosis:   Patient Active Problem List   Diagnosis Date Noted  . Nausea [R11.0] 01/18/2017  . Respiratory failure with hypoxia ([place]) [J96.91] 12/14/2016  . Seizures ([place]) [R56.9] 12/12/2016  . DNR (do not resuscitate) discussion [Z71.89] 08/11/2016  . Muscle weakness (generalized) [M62.81]   . GI bleed [K92.2] 08/07/2016  . OSA (obstructive sleep apnea) [G47.33] 04/29/2016  . Anemia [D64.9] 04/29/2016  . Thrombocytopenia ([place]) [D69.6] 04/29/2016  . Coagulopathy ([place]) [D68.9] 04/29/2016  . Obesity [E66.9] 04/29/2016  . Controlled type 2 diabetes mellitus without complication ([place]) [T90.3] 01/15/2016  . Essential (primary) hypertension [I10] 12/11/2015  . Pure hypercholesterolemia [E78.00] 12/11/2015  . Hepatic encephalopathy ([place]) [K72.90] 10/16/2015  . Alcoholic cirrhosis of liver with ascites ([place]) [K70.31] 07/19/2015  . Elevated transaminase level [R74.0] 07/04/2015  . Alcohol abuse [F10.10] 05/31/2015  . Paranoid schizophrenia ([place]) [F20.0] 03/20/2015    Total Time spent with patient: 45 minutes  Subjective:   Howard [name] is a 36 y.o. male patient admitted with "it's not any better".  HPI:  Patient seen and interviewed. Chart reviewed. Patient well known to me in fact I just saw him earlier this afternoon in my office. This is a 36 year old man with schizophrenia and alcohol abuse. He came to his appointment today reporting that his hallucinations were still going on almost constantly. He could hear voices most of the time and frequently they were telling him to kill himself. He has been compliant with prescribed medicine. Patient's mood is sad and  down and anxious all the time. He is also getting more irritable at home with his family. Sleep is poor. Appetite is poor. Generally feels weak. No thoughts however of hurting anyone else. I had seen him last week and changed his antipsychotic slightly but he has not had any improvement. He continues to drink alcohol several times a week including last night. Denies any other drug abuse.  Medical history: Overweight. Patient has been abusing alcohol for the last few years and although he has cut down considerably he also has developed cirrhosis with multiple complications. Has had hospitalizations for hepatic encephalopathy. Has mild diabetes and high blood pressure as well.  Substance abuse history: Started drinking just a few years ago but was drinking very heavily. Eventually cut down and has had some brief periods of stopping entirely but has not managed to stay completely sober. Continues to drink 3 or 4 nights a week often several beers at a time. No other drug use.  Social history: Not married. Lives with his mother and brother. Little activity outside the home.  Past Psychiatric History: Long-standing schizophrenia which used to be fairly well controlled on medication. He had taken olanzapine alone for years with good control. After his alcohol problem got out of control things of gone downhill. Recently I've had more trouble controlling his psychosis. He has a one history of a distant suicide attempt as an adolescent. He has had hospitalizations but for the most part is managed as an outpatient. Having the voices tell him to kill himself seems to be a newer complication.  Risk to Self: Is patient at risk for suicide?: No Risk to Others:   Prior Inpatient Therapy:   Prior Outpatient Therapy:  Past Medical History:  Past Medical History:  Diagnosis Date  . Alcoholic cirrhosis of liver with ascites ([place])   . Alcoholism ([place])   . Anemia   . COPD (chronic obstructive pulmonary disease)  ([place])   . Depression   . Diabetes mellitus, type II ([place])   . Esophageal varices ([place])   . Heart disease    irregular heart beat (palpitations) and heart murmur  . Hyperlipemia   . Hypertension   . Liver disease   . Multiple thyroid nodules   . Portal hypertensive gastropathy ([place])   . Schizophrenia ([place])   . Seizures ([place])     Past Surgical History:  Procedure Laterality Date  . ESOPHAGOGASTRODUODENOSCOPY N/A 10/05/2015   Procedure: ESOPHAGOGASTRODUODENOSCOPY (EGD);  Surgeon: Josefine Class, MD;  Location: [facility] ENDOSCOPY;  Service: Endoscopy;  Laterality: N/A;  . ESOPHAGOGASTRODUODENOSCOPY (EGD) WITH PROPOFOL N/A 08/03/2015   Procedure: ESOPHAGOGASTRODUODENOSCOPY (EGD) WITH PROPOFOL;  Surgeon: Josefine Class, MD;  [facility] END[facility]COPY;  Service: Endoscopy;  Laterality: N/A;  . ESOPHAGOGASTRODUODENOSCOPY (EGD) WITH PROPOFOL N/A 08/31/2015   Procedure: ESOPHAGOGASTRODUODENOSCOPY (EGD) WITH PROPOFOL;  Surgeon: Josefine Class, MD;  Location: [facility] ENDOSCOPY;  Service: Endoscopy;  Laterality: N/A;  . ESOPHAGOGASTRODUODENOSCOPY (EGD) WITH PROPOFOL N/A 04/04/2016   Procedure: ESOPHAGOGASTRODUODENOSCOPY (EGD) WITH PROPOFOL;  Surgeon: Manya Silvas, MD;  Location: [facility] ENDOSCOPY;  Service: Endoscopy;  Laterality: N/A;  . ESOPHAGOGASTRODUODENOSCOPY (EGD) WITH PROPOFOL N/A 11/13/2016   Procedure: ESOPHAGOGASTRODUODENOSCOPY (EGD) WITH PROPOFOL;  Surgeon: Jonathon Bellows, MD;  Location: [facility] ENDOSCOPY;  Service: Endoscopy;  Laterality: N/A;  . NO PAST SURGERIES     Family History:  Family History  Problem Relation Age of Onset  . Heart disease Mother   . Hypertension Mother   . Hyperlipidemia Mother   . Stroke Father   . Heart attack Father   . Hypertension Father   . Heart disease Father   . Alcohol abuse Father   . Heart disease Brother    Family Psychiatric  History: Positive for some psychosis and substance abuse in other family members Social History:  History   Alcohol Use  . 3.6 oz/week  . 6 Cans of beer per week    Comment: "one quart" 2-3 times per week     History  Drug Use No    Social History   Social History  . Marital status: Single    Spouse name: N/A  . Number of children: N/A  . Years of education: N/A   Occupational History  . disabled    Social History Main Topics  . Smoking status: Never Smoker  . Smokeless tobacco: Never Used  . Alcohol use 3.6 oz/week    6 Cans of beer per week     Comment: "one quart" 2-3 times per week  . Drug use: No  . Sexual activity: No   Other Topics Concern  . None   Social History Narrative   ** Merged History Encounter **       Additional Social History:    Allergies:  No Known Allergies  Labs:  Results for orders placed or performed during the hospital encounter of 02/03/17 (from the past 48 hour(s))  Comprehensive metabolic panel     Status: Abnormal   Collection Time: 02/03/17  2:02 PM  Result Value Ref Range   Sodium 139 135 - 145 mmol/L   Potassium 4.1 3.5 - 5.1 mmol/L   Chloride 99 (L) 101 - 111 mmol/L   CO2 31 22 - 32 mmol/L  Glucose, Bld 142 (H) 65 - 99 mg/dL   BUN 7 6 - 20 mg/dL   Creatinine, Ser 1.01 0.61 - 1.24 mg/dL   Calcium 9.2 8.9 - 10.3 mg/dL   Total Protein 8.1 6.5 - 8.1 g/dL   Albumin 3.6 3.5 - 5.0 g/dL   AST 52 (H) 15 - 41 U/L   ALT 29 17 - 63 U/L   Alkaline Phosphatase 147 (H) 38 - 126 U/L   Total Bilirubin 0.8 0.3 - 1.2 mg/dL   GFR calc non Af Amer >60 >60 mL/min   GFR calc Af Amer >60 >60 mL/min    Comment: (NOTE) The eGFR has been calculated using the CKD EPI equation. This calculation has not been validated in all clinical situations. eGFR's persistently <60 mL/min signify possible Chronic Kidney Disease.    Anion gap 9 5 - 15  Ethanol     Status: None   Collection Time: 02/03/17  2:02 PM  Result Value Ref Range   Alcohol, Ethyl (B) <5 <5 mg/dL    Comment:        LOWEST DETECTABLE LIMIT FOR SERUM ALCOHOL IS 5 mg/dL FOR MEDICAL  PURPOSES ONLY   Salicylate level     Status: None   Collection Time: 02/03/17  2:02 PM  Result Value Ref Range   Salicylate Lvl <0.4 2.8 - 30.0 mg/dL  Acetaminophen level     Status: Abnormal   Collection Time: 02/03/17  2:02 PM  Result Value Ref Range   Acetaminophen (Tylenol), Serum <10 (L) 10 - 30 ug/mL    Comment:        THERAPEUTIC CONCENTRATIONS VARY SIGNIFICANTLY. A RANGE OF 10-30 ug/mL MAY BE AN EFFECTIVE CONCENTRATION FOR MANY PATIENTS. HOWEVER, SOME ARE BEST TREATED AT CONCENTRATIONS OUTSIDE THIS RANGE. ACETAMINOPHEN CONCENTRATIONS >150 ug/mL AT 4 HOURS AFTER INGESTION AND >50 ug/mL AT 12 HOURS AFTER INGESTION ARE OFTEN ASSOCIATED WITH TOXIC REACTIONS.   cbc     Status: Abnormal   Collection Time: 02/03/17  2:02 PM  Result Value Ref Range   WBC 6.9 3.8 - 10.6 K/uL   RBC 4.19 (L) 4.40 - 5.90 MIL/uL   Hemoglobin 10.8 (L) 13.0 - 18.0 g/dL   HCT 34.6 (L) 40.0 - 52.0 %   MCV 82.6 80.0 - 100.0 fL   MCH 25.9 (L) 26.0 - 34.0 pg   MCHC 31.3 (L) 32.0 - 36.0 g/dL   RDW 21.5 (H) 11.5 - 14.5 %   Platelets 121 (L) 150 - 440 K/uL  Urine Drug Screen, Qualitative     Status: None   Collection Time: 02/03/17  2:02 PM  Result Value Ref Range   Tricyclic, Ur Screen NONE DETECTED NONE DETECTED   Amphetamines, Ur Screen NONE DETECTED NONE DETECTED   MDMA (Ecstasy)Ur Screen NONE DETECTED NONE DETECTED   Cocaine Metabolite,Ur [place] NONE DETECTED NONE DETECTED   Opiate, Ur Screen NONE DETECTED NONE DETECTED   Phencyclidine (PCP) Ur S NONE DETECTED NONE DETECTED   Cannabinoid 50 Ng, Ur  NONE DETECTED NONE DETECTED   Barbiturates, Ur Screen NONE DETECTED NONE DETECTED   Benzodiazepine, Ur Scrn NONE DETECTED NONE DETECTED   Methadone Scn, Ur NONE DETECTED NONE DETECTED    Comment: (NOTE) 888  Tricyclics, urine               Cutoff 1000 ng/mL 200  Amphetamines, urine             Cutoff 1000 ng/mL 300  MDMA (Ecstasy), urine  Cutoff 500 ng/mL 400  Cocaine Metabolite, urine        Cutoff 300 ng/mL 500  Opiate, urine                   Cutoff 300 ng/mL 600  Phencyclidine (PCP), urine      Cutoff 25 ng/mL 700  Cannabinoid, urine              Cutoff 50 ng/mL 800  Barbiturates, urine             Cutoff 200 ng/mL 900  Benzodiazepine, urine           Cutoff 200 ng/mL 1000 Methadone, urine                Cutoff 300 ng/mL 1100 1200 The urine drug screen provides only a preliminary, unconfirmed 1300 analytical test result and should not be used for non-medical 1400 purposes. Clinical consideration and professional judgment should 1500 be applied to any positive drug screen result due to possible 1600 interfering substances. A more specific alternate chemical method 1700 must be used in order to obtain a confirmed analytical result.  1800 Gas chromato graphy / mass spectrometry (GC/MS) is the preferred 1900 confirmatory method.     Current Facility-Administered Medications  Medication Dose Route Frequency Provider Last Rate Last Dose  . acamprosate (CAMPRAL) tablet 666 mg  666 mg Oral TID WC [name], [name], MD      . albuterol (PROVENTIL HFA;VENTOLIN HFA) 108 (90 Base) MCG/ACT inhaler 2 puff  2 puff Inhalation Q4H PRN [name], [name], MD      . citalopram (CELEXA) tablet 20 mg  20 mg Oral Daily [name], [name], MD      . folic acid (FOLVITE) tablet 1 mg  1 mg Oral Daily [name], [name], MD      . furosemide (LASIX) tablet 20 mg  20 mg Oral Daily [name], [name], MD      . lactulose (CHRONULAC) 10 GM/15ML solution 45 g  45 g Oral TID [name], [name], MD      . levETIRAcetam (KEPPRA) tablet 1,500 mg  1,500 mg Oral BID [name], [name], MD      . LORazepam (ATIVAN) tablet 1 mg  1 mg Oral Q6H PRN [name], Madie Reno, MD       Or  . LORazepam (ATIVAN) injection 1 mg  1 mg Intravenous Q6H PRN [name], [name], MD      . losartan (COZAAR) tablet 25 mg  25 mg Oral Daily [name], [name], MD      . lurasidone (LATUDA) tablet 40 mg  40 mg Oral Q supper [name], [name], MD      .  lurasidone (LATUDA) tablet 80 mg  80 mg Oral Q supper [name], [name], MD      . metFORMIN (GLUCOPHAGE) tablet 500 mg  500 mg Oral BID WC [name], [name], MD      . multivitamin with minerals tablet 1 tablet  1 tablet Oral Daily [name], [name], MD      . nadolol (CORGARD) tablet 20 mg  20 mg Oral Daily [name], [name], MD      . rifaximin Doreene Nest) tablet 550 mg  550 mg Oral BID [name], [name], MD      . spironolactone (ALDACTONE) tablet 50 mg  50 mg Oral BID [name], [name], MD      . sucralfate (CARAFATE) tablet 1 g  1 g Oral  TID WC & HS [name], [name], MD      . thiamine (VITAMIN B-1) tablet 100 mg  100 mg Oral Daily [name], [name], MD       Or  . thiamine (B-1) injection 100 mg  100 mg Intravenous Daily [name], [name], MD      . traZODone (DESYREL) tablet 100 mg  100 mg Oral QHS [name], Madie Reno, MD       Current Outpatient Prescriptions  Medication Sig Dispense Refill  . acamprosate (CAMPRAL) 333 MG tablet Take 2 tablets (666 mg total) by mouth 3 (three) times daily with meals. 180 tablet 5  . albuterol (PROVENTIL HFA;VENTOLIN HFA) 108 (90 Base) MCG/ACT inhaler Inhale 2 puffs into the lungs every 6 (six) hours as needed for wheezing or shortness of breath. 1 Inhaler 2  . docusate sodium (COLACE) 100 MG capsule Take 100 mg by mouth daily as needed for mild constipation.    . ferrous sulfate 325 (65 FE) MG tablet Take 3 tablets (975 mg total) by mouth every Monday, Wednesday, and Friday. With breakfast 36 tablet 5  . folic acid (FOLVITE) 1 MG tablet Take 1 tablet (1 mg total) by mouth daily. 30 tablet 0  . furosemide (LASIX) 20 MG tablet Take 1 tablet by mouth daily.    . haloperidol (HALDOL) 5 MG tablet Take 1 tablet (5 mg total) by mouth 2 (two) times daily. 60 tablet 1  . lactulose (CHRONULAC) 10 GM/15ML solution Take 45 mLs (30 g total) by mouth every 4 (four) hours. (Patient taking differently: Take 30 g by mouth 4 (four) times daily. ) 240 mL 0  . levETIRAcetam (KEPPRA) 750 MG  tablet Take 2 tablets (1,500 mg total) by mouth 2 (two) times daily. 120 tablet 0  . metFORMIN (GLUCOPHAGE) 500 MG tablet Take 1 tablet (500 mg total) by mouth 2 (two) times daily with a meal. 60 tablet 0  . rifaximin (XIFAXAN) 550 MG TABS tablet Take 1 tablet (550 mg total) by mouth 2 (two) times daily. 60 tablet 5  . spironolactone (ALDACTONE) 50 MG tablet Take 1 tablet (50 mg total) by mouth 2 (two) times daily. 60 tablet 5  . thiamine (VITAMIN B-1) 100 MG tablet Take 100 mg by mouth daily.    . traZODone (DESYREL) 100 MG tablet Take 100 mg by mouth at bedtime as needed for sleep.     . citalopram (CELEXA) 20 MG tablet Take 1 tablet (20 mg total) by mouth every morning. (Patient not taking: Reported on 02/03/2017) 30 tablet 5  . losartan (COZAAR) 25 MG tablet Take 25 mg by mouth daily.    . nadolol (CORGARD) 20 MG tablet Take 20 mg by mouth daily.    . NON FORMULARY       Musculoskeletal: Strength & Muscle Tone: decreased Gait & Station: normal Patient leans: N/A  Psychiatric Specialty Exam: Physical Exam  Nursing note and vitals reviewed. Constitutional: He appears well-developed and well-nourished.  HENT:  Head: Normocephalic and atraumatic.  Eyes: Conjunctivae are normal. Pupils are equal, round, and reactive to light.  Neck: Normal range of motion.  Cardiovascular: Normal heart sounds.   Respiratory: Effort normal.  GI: Soft. He exhibits distension.  Musculoskeletal: Normal range of motion.  Neurological: He is alert.  Skin: Skin is warm and dry.  Psychiatric: Judgment normal. His affect is blunt. His speech is delayed. He is slowed, withdrawn and actively hallucinating. Cognition and memory are impaired. He expresses suicidal ideation.  Review of Systems  HENT: Negative.   Eyes: Negative.   Respiratory: Negative.   Cardiovascular: Negative.   Gastrointestinal: Negative.   Musculoskeletal: Negative.   Skin: Negative.   Neurological: Positive for tremors and weakness.   Psychiatric/Behavioral: Positive for depression, hallucinations, substance abuse and suicidal ideas. Negative for memory loss. The patient is nervous/anxious and has insomnia.     Blood pressure 106/60, pulse 76, temperature 98.1 F (36.7 C), temperature source Oral, resp. rate 20, height 6' 3"  (1.905 m), weight (!) 158.8 kg (350 lb), SpO2 90 %.Body mass index is 43.75 kg/m.  General Appearance: Casual  Eye Contact:  Fair  Speech:  Slow  Volume:  Decreased  Mood:  Anxious and Depressed  Affect:  Blunt  Thought Process:  Goal Directed  Orientation:  Full (Time, Place, and Person)  Thought Content:  Logical and Rumination  Suicidal Thoughts:  Yes.  with intent/plan  Homicidal Thoughts:  No  Memory:  Immediate;   Fair Recent;   Poor Remote;   Fair  Judgement:  Fair  Insight:  Fair  Psychomotor Activity:  Decreased  Concentration:  Concentration: Fair  Recall:  [employer] of Knowledge:  Fair  Language:  Fair  Akathisia:  No  Handed:  Right  AIMS (if indicated):     Assets:  Housing Social Support  ADL's:  Intact  Cognition:  Impaired,  Mild  Sleep:        Treatment Plan Summary: Daily contact with patient to assess and evaluate symptoms and progress in treatment, Medication management and Plan 36 year old man with schizophrenia. Recently with a worsening of his hallucinations and worsening suicidal thoughts. Also abusing alcohol. Patient does not feel safe or comfortable outside the hospital. I had planned to day to discontinue his Haldol because he is getting more and more of a tremor since we started it. I was going to increase his Latuda to 80 mg a day. Because of his suicidal thoughts and anxiety he will be admitted to the psychiatric ward. I continued his multiple medications based on his prior hospitalization but discontinued the Haldol and increased his Latuda. We'll check chest x-ray and labs. Orders completed in case reviewed with TTS.  Disposition: Recommend  psychiatric Inpatient admission when medically cleared. Supportive therapy provided about ongoing stressors.  Alethia Berthold, MD 02/03/2017 4:20 PM

## 2017-02-03 NOTE — ED Notes (Signed)

## 2017-02-03 NOTE — Progress Notes (Signed)
Follow-up for this patient with schizophrenia and alcohol abuse. Patient reports that his voices are no better. Continue much of the time. Suicidal content. Affect is sad down and somewhat irritable. Has reasonable insight and no wish to die but feels at times unsafe. Really he admits that he has continued to drink several times a week. Doesn't feel like there's been any change from his medication. Still having a lot of tremor and now having tooth grinding.  Casually dressed. Slow. Flattened affect. Looks more sad and down. Suicidal thoughts passive but present. Ongoing hallucinations.  Patient feels unsafe and uncomfortable because of the suicidal content. He thinks he will likely go to the emergency room for possible admission. He was warned that it may be a wait to get in. In the meantime I would like him to increase his Latuda to 80 mg a day and discontinue the Haldol which appears not to be working and is just causing side effects.

## 2017-02-03 NOTE — ED Notes (Addendum)
"  I had my appointment with Dr Toni Amendlapacs today because my Zyprexa stopped working and he told me to come here so I can be admitted.  My voices are telling me to harm myself - I don't plan on acting on my voices - I am a burden to my family."     Pt reports that he wears 2L of oxygen at hs

## 2017-02-03 NOTE — ED Provider Notes (Signed)
[facility] Emergency Department Provider Note  ____________________________________________  Time seen: Approximately 3:07 PM  I have reviewed the triage vital signs and the nursing notes.   HISTORY  Chief Complaint Hallucinations   HPI Howard [name] is a 36 y.o. male with a history of alcohol abuse, alcoholic cirrhosis, diabetes, hypertension, and schizophrenia who presents for evaluation of hallucinations.Patient is followed by Dr. Toni Amendlapacs and saw him today at clinic. According to patient he was sent to the ED by Dr. Toni Amendlapacs to be admitted. Patient reports few weeks of auditory hallucinations. Voices are telling him to kill himself. He reports that his Zyprexa is no longer working. He continues to drink alcohol and tells me he drinks about 4 beers 4 times a week. Last alcohol use was yesterday. He denies fever or chills, chest pain or shortness of breath, abdominal pain or distention, nausea or vomiting, headache. He denies suicidal or homicidal ideation. He denies any other drug use. He denies any other medical complaints.  Past Medical History:  Diagnosis Date  . Alcoholic cirrhosis of liver with ascites (HCC)   . Alcoholism (HCC)   . Anemia   . COPD (chronic obstructive pulmonary disease) (HCC)   . Depression   . Diabetes mellitus, type II (HCC)   . Esophageal varices (HCC)   . Heart disease    irregular heart beat (palpitations) and heart murmur  . Hyperlipemia   . Hypertension   . Liver disease   . Multiple thyroid nodules   . Portal hypertensive gastropathy (HCC)   . Schizophrenia (HCC)   . Seizures [facility](HCC)     Patient Active Problem List   Diagnosis Date Noted  . Nausea 01/18/2017  . Respiratory failure with hypoxia (HCC) 12/14/2016  . Seizures (HCC) 12/12/2016  . DNR (do not resuscitate) discussion 08/11/2016  . Muscle weakness (generalized)   . GI bleed 08/07/2016  . OSA (obstructive sleep apnea) 04/29/2016  . Anemia 04/29/2016  .  Thrombocytopenia (HCC) 04/29/2016  . Coagulopathy (HCC) 04/29/2016  . Obesity 04/29/2016  . Controlled type 2 diabetes mellitus without complication (HCC) 01/15/2016  . Essential (primary) hypertension 12/11/2015  . Pure hypercholesterolemia 12/11/2015  . Hepatic encephalopathy (HCC) 10/16/2015  . 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 Elevated transaminase level 07/04/2015  . Alcohol abuse 05/31/2015  . Paranoid schizophrenia (HCC) 03/20/2015    Past Surgical History:  Procedure Laterality Date  . ESOPHAGOGASTRODUODENOSCOPY N/A 10/05/2015   Procedure: ESOPHAGOGASTRODUODENOSCOPY (EGD);  Surgeon: Elnita MaxwellMatthew Gordon Rein, MD;  [facility]COPY;  Service: Endoscopy;  Laterality: N/A;  . ESOPHAGOGASTRODUODENOSCOPY (EGD) WITH PROPOFOL N/A 08/03/2015   Procedure: ESOPHAGOGASTRODUODENOSCOPY (EGD) WITH PROPOFOL;  Surgeon: Elnita MaxwellMatthew Gordon Rein, MD;  [facility];  Service: Endoscopy;  Laterality: N/A;  . ESOPHAGOGASTRODUODENOSCOPY (EGD) WITH PROPOFOL N/A 08/31/2015   Procedure: ESOPHAGOGASTRODUODENOSCOPY (EGD) WITH PROPOFOL;  Surgeon: Elnita MaxwellMatthew Gordon Rein, MD;  [facility];  Service: Endoscopy;  Laterality: N/A;  . ESOPHAGOGASTRODUODENOSCOPY (EGD) WITH PROPOFOL N/A 04/04/2016   Procedure: ESOPHAGOGASTRODUODENOSCOPY (EGD) WITH PROPOFOL;  Surgeon: Scot Junobert T Elliott, MD;  [facility];  Service: Endoscopy;  Laterality: N/A;  . ESOPHAGOGASTRODUODENOSCOPY (EGD) WITH PROPOFOL N/A 11/13/2016   Procedure: ESOPHAGOGASTRODUODENOSCOPY (EGD) WITH PROPOFOL;  Surgeon: Wyline MoodKiran Anna, MD;  Location: [facility] ENDOSCOPY;  Service: Endoscopy;  Laterality: N/A;  . NO PAST SURGERIES      Prior to Admission medications   Medication Sig Start Date End Date Taking? Authorizing Provider  acamprosate (CAMPRAL) 333 MG tablet Take 2 tablets (666 mg  total) by mouth 3 (three) times daily with meals. 08/19/16  Yes [name], [name], MD  albuterol (PROVENTIL HFA;VENTOLIN HFA)  108 (90 Base) MCG/ACT inhaler Inhale 2 puffs into the lungs every 6 (six) hours as needed for wheezing or shortness of breath. 10/08/16  Yes Shaune Pollack, MD  docusate sodium (COLACE) 100 MG capsule Take 100 mg by mouth daily as needed for mild constipation.   Yes [provider]  ferrous sulfate 325 (65 FE) MG tablet Take 3 tablets (975 mg total) by mouth every Monday, Wednesday, and Friday. With breakfast 01/21/17  Yes [name], NP  folic acid (FOLVITE) 1 MG tablet Take 1 tablet (1 mg total) by mouth daily. 12/19/16  Yes Gouru, Deanna Artis, MD  furosemide (LASIX) 20 MG tablet Take 1 tablet by mouth daily. 12/08/16  Yes [provider]  haloperidol (HALDOL) 5 MG tablet Take 1 tablet (5 mg total) by mouth 2 (two) times daily. 01/14/17  Yes [name], [name], MD  lactulose (CHRONULAC) 10 GM/15ML solution Take 45 mLs (30 g total) by mouth every 4 (four) hours. Patient taking differently: Take 30 g by mouth 4 (four) times daily.  07/09/16  Yes Katharina Caper, MD  levETIRAcetam (KEPPRA) 750 MG tablet Take 2 tablets (1,500 mg total) by mouth 2 (two) times daily. 12/11/16 02/09/17 Yes Merrily Brittle, MD  metFORMIN (GLUCOPHAGE) 500 MG tablet Take 1 tablet (500 mg total) by mouth 2 (two) times daily with a meal. 05/15/16  Yes Wieting, Richard, MD  rifaximin (XIFAXAN) 550 MG TABS tablet Take 1 tablet (550 mg total) by mouth 2 (two) times daily. 07/09/16  Yes Katharina Caper, MD  spironolactone (ALDACTONE) 50 MG tablet Take 1 tablet (50 mg total) by mouth 2 (two) times daily. 07/09/16  Yes Katharina Caper, MD  thiamine (VITAMIN B-1) 100 MG tablet Take 100 mg by mouth daily.   Yes [provider]  traZODone (DESYREL) 100 MG tablet Take 100 mg by mouth at bedtime as needed for sleep.    Yes [provider]  citalopram (CELEXA) 20 MG tablet Take 1 tablet (20 mg total) by mouth every morning. Patient not taking: Reported on 02/03/2017 08/19/16   [name], [name], MD  losartan (COZAAR) 25  MG tablet Take 25 mg by mouth daily.    [provider]  nadolol (CORGARD) 20 MG tablet Take 20 mg by mouth daily.    [provider]  NON FORMULARY     [provider]    Allergies Patient has no known allergies.  Family History  Problem Relation Age of Onset  . Heart disease Mother   . Hypertension Mother   . Hyperlipidemia Mother   . Stroke Father   . Heart attack Father   . Hypertension Father   . Heart disease Father   . Alcohol abuse Father   . Heart disease Brother     Social History Social History  Substance Use Topics  . Smoking status: Never Smoker  . Smokeless tobacco: Never Used  . Alcohol use 3.6 oz/week    6 Cans of beer per week     Comment: "one quart" 2-3 times per week    Review of Systems  Constitutional: Negative for fever. Eyes: Negative for visual changes. ENT: Negative for sore throat. Neck: No neck pain  Cardiovascular: Negative for chest pain. Respiratory: Negative for shortness of breath. Gastrointestinal: Negative for abdominal pain, vomiting or diarrhea. Genitourinary: Negative for dysuria. Musculoskeletal: Negative for back pain. Skin: Negative for  rash. Neurological: Negative for headaches, weakness or numbness. Psych: No SI or HI. + auditory hallucinations  ____________________________________________   PHYSICAL EXAM:  VITAL SIGNS: ED Triage Vitals  Enc Vitals Group     BP 02/03/17 1403 112/75     Pulse Rate 02/03/17 1403 86     Resp 02/03/17 1403 18     Temp 02/03/17 1403 98.6 F (37 C)     Temp Source 02/03/17 1403 Oral     SpO2 02/03/17 1403 92 %     Weight 02/03/17 1400 (!) 350 lb (158.8 kg)     Height 02/03/17 1400 6\' 3"  (1.905 m)     Head Circumference --      Peak Flow --      Pain Score 02/03/17 1435 0     Pain Loc --      Pain Edu? --      Excl. in GC? --     Constitutional: Alert and oriented. Well appearing and in no apparent distress. HEENT:      Head: Normocephalic and  atraumatic.         Eyes: Conjunctivae are normal. Sclera is non-icteric. EOMI. PERRL      Mouth/Throat: Mucous membranes are moist.       Neck: Supple with no signs of meningismus. Cardiovascular: Regular rate and rhythm. No murmurs, gallops, or rubs. 2+ symmetrical distal pulses are present in all extremities. No JVD. Respiratory: Normal respiratory effort. Lungs are clear to auscultation bilaterally. No wheezes, crackles, or rhonchi.  Gastrointestinal: Soft, non tender, and non distended with positive bowel sounds. No rebound or guarding. Genitourinary: No CVA tenderness. Musculoskeletal: Nontender with normal range of motion in all extremities. No edema, cyanosis, or erythema of extremities. Neurologic: Normal speech and language. Face is symmetric. Moving all extremities. No gross focal neurologic deficits are appreciated. Skin: Skin is warm, dry and intact. No rash noted. Psychiatric: Mood and affect are normal. Speech and behavior are normal. AH no SI or HI  ____________________________________________   LABS (all labs ordered are listed, but only abnormal results are displayed)  Labs Reviewed  COMPREHENSIVE METABOLIC PANEL - Abnormal; Notable for the following:       Result Value   Chloride 99 (*)    Glucose, Bld 142 (*)    AST 52 (*)    Alkaline Phosphatase 147 (*)    All other components within normal limits  ACETAMINOPHEN LEVEL - Abnormal; Notable for the following:    Acetaminophen (Tylenol), Serum <10 (*)    All other components within normal limits  CBC - Abnormal; Notable for the following:    RBC 4.19 (*)    Hemoglobin 10.8 (*)    HCT 34.6 (*)    MCH 25.9 (*)    MCHC 31.3 (*)    RDW 21.5 (*)    Platelets 121 (*)    All other components within normal limits  ETHANOL  SALICYLATE LEVEL  URINE DRUG SCREEN, QUALITATIVE ([facility] ONLY)  AMMONIA    ____________________________________________  EKG  none ____________________________________________  RADIOLOGY  none  ____________________________________________   PROCEDURES  Procedure(s) performed: None Procedures Critical Care performed:  None ____________________________________________   INITIAL IMPRESSION / ASSESSMENT AND PLAN / ED COURSE  36 y.o. male with a history of alcohol abuse, alcoholic cirrhosis, diabetes, hypertension, and schizophrenia who presents to ED sent by [name] for admission for [facility]. Patient has no other complaints at this time. Vitals are within normal limits, he has no suicidal or homicidal ideation. Patient  is here voluntarily. We'll check basic blood work including ammonia to rule out possible alternative etiologies of patient's hallucinations and consult [name]     Pertinent labs & imaging results that were available during my care of the patient were reviewed by me and considered in my medical decision making (see chart for details).    ____________________________________________   FINAL CLINICAL IMPRESSION(S) / ED DIAGNOSES  Final diagnoses:  Auditory hallucinations      NEW MEDICATIONS STARTED DURING THIS VISIT:  New Prescriptions   No medications on file     Note:  This document was prepared using Dragon voice recognition software and may include unintentional dictation errors.    Don Perking, [place], MD 02/03/17 [phone]

## 2017-02-03 NOTE — BH Assessment (Signed)
Assessment Note  Howard [name] is an 36 y.o. male who presents to ED with c/o hearing voices telling him to kill himself. He denies ever acting on these thoughts in the past. Pt has history of cutting; as he showed his left forearm to this writer showing most recent scars. He states he last cut "a few weeks ago". Pt is currently receiving outpatient services with [facility] ([name]). He reports recent use of alcohol with date of last use being "yesterday-3 cans of beer". He reports symptoms of depression, increased sleep patterns (14+ hours), and decreased appetite.  Pt was coherent and logical in his thought process and content during TTS assessment. He was alert and oriented x4, with a pleasant and cooperative demeanor.  Diagnosis: Schizophrenia  Past Medical History:  Past Medical History:  Diagnosis Date  . Alcoholic cirrhosis of liver with ascites (HCC)   . Alcoholism (HCC)   . Anemia   . COPD (chronic obstructive pulmonary disease) (HCC)   . Depression   . Diabetes mellitus, type II (HCC)   . Esophageal varices (HCC)   . Heart disease    irregular heart beat (palpitations) and heart murmur  . Hyperlipemia   . Hypertension   . Liver disease   . Multiple thyroid nodules   . Portal hypertensive gastropathy (HCC)   . Schizophrenia (HCC)   . Seizures (HCC)     Past Surgical History:  Procedure Laterality Date  . ESOPHAGOGASTRODUODENOSCOPY N/A 10/05/2015   Procedure: ESOPHAGOGASTRODUODENOSCOPY (EGD);  Surgeon: Elnita Maxwell, MD;  Location: [facility] ENDOSCOPY;  Service: Endoscopy;  Laterality: N/A;  . ESOPHAGOGASTRODUODENOSCOPY (EGD) WITH PROPOFOL N/A 08/03/2015   Procedure: ESOPHAGOGASTRODUODENOSCOPY (EGD) WITH PROPOFOL;  Surgeon: Elnita Maxwell, MD;  Location: [facility] ENDOSCOPY;  Service: Endoscopy;  Laterality: N/A;  . ESOPHAGOGASTRODUODENOSCOPY (EGD) WITH PROPOFOL N/A 08/31/2015   Procedure: ESOPHAGOGASTRODUODENOSCOPY (EGD) WITH PROPOFOL;  Surgeon: Elnita Maxwell, MD;  Location[facility] ENDOSCOPY;  Service: Endoscopy;  Laterality: N/A;  . ESOPHAGOGASTRODUODENOSCOPY (EGD) WITH PROPOFOL N/A 04/04/2016   Procedure: ESOPHAGOGASTRODUODENOSCOPY (EGD) WITH PROPOFOL;  Surgeon: Scot Jun, MD;  Location: [facility] ENDOSCOPY;  Service: Endoscopy;  Laterality: N/A;  . ESOPHAGOGASTRODUODENOSCOPY (EGD) WITH PROPOFOL N/A 11/13/2016   Procedure: ESOPHAGOGASTRODUODENOSCOPY (EGD) WITH PROPOFOL;  Surgeon: Wyline Mood, MD;  Location: [facility] ENDOSCOPY;  Service: Endoscopy;  Laterality: N/A;  . NO PAST SURGERIES      Family History:  Family History  Problem Relation Age of Onset  . Heart disease Mother   . Hypertension Mother   . Hyperlipidemia Mother   . Stroke Father   . Heart attack Father   . Hypertension Father   . Heart disease Father   . Alcohol abuse Father   . Heart disease Brother     Social History:  reports that he has never smoked. He has never used smokeless tobacco. He reports that he drinks about 3.6 oz of alcohol per week . He reports that he does not use drugs.  Additional Social History:  Alcohol / Drug Use Pain Medications: None Reported Prescriptions: None Reported Over the Counter: None Reported History of alcohol / drug use?: Yes Longest period of sobriety (when/how long): Years Negative Consequences of Use: Personal relationships, Financial Withdrawal Symptoms: Sweats, Tremors Substance #1 Name of Substance 1: Alcohol 1 - Age of First Use: 36yo 1 - Amount (size/oz): "3 cans of beer" 1 - Frequency: "3-4 times a week" 1 - Duration: UKN 1 - Last Use / Amount: 02/02/17  CIWA: CIWA-Ar BP: 106/60 Pulse  Rate: 76 COWS:    Allergies: No Known Allergies  Home Medications:  (Not in a hospital admission)  OB/GYN Status:  No LMP for male patient.  General Assessment Data Location of Assessment: [facility] ED TTS Assessment: In system Is this a Tele or Face-to-Face Assessment?: Face-to-Face Is this an Initial Assessment or a  Re-assessment for this encounter?: Initial Assessment Marital status: Single Maiden name: N/A Is patient pregnant?: No Pregnancy Status: No Living Arrangements: Other (Comment) (Lives with mother and brother) Can pt return to current living arrangement?: Yes Admission Status: Voluntary Is patient capable of signing voluntary admission?: Yes Referral Source: Self/Family/Friend Insurance type: Medicare  Medical Screening Exam [facility] Walk-in ONLY) Medical Exam completed: Yes  Crisis Care Plan Living Arrangements: Other (Comment) (Lives with mother and brother) Legal Guardian: Other: (Self) Name of Psychiatrist: [name] Name of Therapist: None Reported  Education Status Is patient currently in school?: No Current Grade: N/A Highest grade of school patient has completed: 8th grade Name of school: N/A Contact person: N/A  Risk to self with the past 6 months Suicidal Ideation: Yes-Currently Present Has patient been a risk to self within the past 6 months prior to admission? : Yes Suicidal Intent: Yes-Currently Present Has patient had any suicidal intent within the past 6 months prior to admission? : Yes Is patient at risk for suicide?: No Suicidal Plan?: No Has patient had any suicidal plan within the past 6 months prior to admission? : Yes Specify Current Suicidal Plan: None Reported Access to Means: No Specify Access to Suicidal Means: N/A What has been your use of drugs/alcohol within the last 12 months?: Alcohol Previous Attempts/Gestures: No How many times?: 0 Other Self Harm Risks: None Reported Triggers for Past Attempts: None known Intentional Self Injurious Behavior: Cutting Comment - Self Injurious Behavior: Pt cuts himself in order to stop the voices Family Suicide History: No Recent stressful life event(s): Other (Comment) (Command hallucinations) Persecutory voices/beliefs?: Yes Depression: Yes Depression Symptoms: Insomnia, Isolating, Fatigue, Guilt, Loss of  interest in usual pleasures, Feeling worthless/self pity Substance abuse history and/or treatment for substance abuse?: Yes Suicide prevention information given to non-admitted patients: Not applicable  Risk to Others within the past 6 months Homicidal Ideation: No Does patient have any lifetime risk of violence toward others beyond the six months prior to admission? : No Thoughts of Harm to Others: No Current Homicidal Intent: No Current Homicidal Plan: No Access to Homicidal Means: No Identified Victim: None Reported History of harm to others?: No Assessment of Violence: None Noted Violent Behavior Description: N/A Does patient have access to weapons?: No Criminal Charges Pending?: No Does patient have a court date: No Is patient on probation?: No  Psychosis Hallucinations: Auditory, With command (Pt denies visual hallucinations) Delusions: None noted  Mental Status Report Appearance/Hygiene: In scrubs Eye Contact: Good Motor Activity: Freedom of movement Speech: Logical/coherent Level of Consciousness: Alert Mood: Depressed Affect: Appropriate to circumstance Anxiety Level: Moderate Thought Processes: Coherent, Relevant Judgement: Impaired Orientation: Person, Place, Time, Situation, Appropriate for developmental age Obsessive Compulsive Thoughts/Behaviors: None  Cognitive Functioning Concentration: Normal Memory: Recent Intact, Remote Intact IQ: Average Insight: Poor Impulse Control: Fair Appetite: Poor Weight Loss: 0 Weight Gain: 0 Sleep: Increased Total Hours of Sleep: 14 Vegetative Symptoms: Staying in bed  ADLScreening [facility] Assessment Services) Patient's cognitive ability adequate to safely complete daily activities?: Yes Patient able to express need for assistance with ADLs?: Yes Independently performs ADLs?: Yes (appropriate for developmental age)  Prior Inpatient Therapy Prior Inpatient  Therapy: Yes Prior Therapy Dates: 2017, 12/2016 Prior [facility]  [facility] Reason for Treatment: Psychosis  Prior Outpatient Therapy Prior Outpatient Therapy: Yes Prior Therapy Dates: Current Prior Therapy Facilty/Provider(s): Dr. Toni Amendlapacs Reason for Treatment: Med Mgt Does patient have an ACCT team?: No Does patient have Intensive In-House Services?  : No Does patient have [facility] services? : No Does patient have P4CC services?: No  ADL Screening (condition at time of admission) Patient's cognitive ability adequate to safely complete daily activities?: Yes Patient able to express need for assistance with ADLs?: Yes Independently performs ADLs?: Yes (appropriate for developmental age)       Abuse/Neglect Assessment (Assessment to be complete while patient is alone) Physical Abuse: Denies Verbal Abuse: Denies Sexual Abuse: Denies Exploitation of patient/patient's resources: Denies Self-Neglect: Denies Values / Beliefs Cultural Requests During Hospitalization: None Spiritual Requests During Hospitalization: None Consults Spiritual Care Consult Needed: No Social Work Consult Needed: No Merchant navy officerAdvance Directives (For Healthcare) Does Patient Have a Medical Advance Directive?: No    Additional Information 1:1 In Past 12 Months?: No CIRT Risk: No Elopement Risk: No Does patient have medical clearance?: Yes  Child/Adolescent Assessment Running Away Risk:  (Patient is an adult)  Disposition:  Disposition Initial Assessment Completed for this Encounter: Yes Disposition of Patient: Referred to (Psych Consult) Patient referred to: Other (Comment) (Psych Consult)  On Site Evaluation by:   Reviewed with Physician:    Wilmon ArmsSTEVENSON, SHALETA 02/03/2017 9:03 PM

## 2017-02-03 NOTE — ED Notes (Signed)
TTS in with patient at this time. 

## 2017-02-03 NOTE — ED Notes (Signed)
BEHAVIORAL HEALTH ROUNDING Patient sleeping: No. Patient alert and oriented: yes Behavior appropriate: Yes.  ; If no, describe:  Nutrition and fluids offered: yes Toileting and hygiene offered: Yes  Sitter present: q15 minute observations and security  monitoring Law enforcement present: Yes  ODS  

## 2017-02-03 NOTE — ED Triage Notes (Signed)
Pt here for auditory hallucinations.  Reports voices telling him to kill himself but he knows not to do it.  Sees dr Cleda Daubklapacs.  None of his medications have been helping.  Denies SI/HI.

## 2017-02-03 NOTE — ED Notes (Signed)
Mom has taken pt clothes and belongings

## 2017-02-03 NOTE — ED Notes (Signed)
Called pharmacy for meds

## 2017-02-04 DIAGNOSIS — F200 Paranoid schizophrenia: Secondary | ICD-10-CM | POA: Diagnosis not present

## 2017-02-04 DIAGNOSIS — R440 Auditory hallucinations: Secondary | ICD-10-CM | POA: Diagnosis not present

## 2017-02-04 MED ORDER — LURASIDONE HCL 80 MG PO TABS: 80.0000 mg | ORAL_TABLET | Freq: Every day | ORAL | 0 refills | Status: DC

## 2017-02-04 MED ORDER — CLONAZEPAM 0.5 MG PO TABS: 0.5000 mg | ORAL_TABLET | Freq: Two times a day (BID) | ORAL | 0 refills | Status: DC | PRN

## 2017-02-04 NOTE — ED Provider Notes (Signed)
-----------------------------------------   4:49 PM on 02/04/2017 -----------------------------------------   Blood pressure 138/75, pulse 88, temperature 97.4 F (36.3 C), temperature source Oral, resp. rate 16, height 6\' 3"  (1.905 m), weight (!) 350 lb (158.8 kg), SpO2 92 %.  The patient had no acute events since last update.  Calm and cooperative at this time.    Seen by Dr. Toni Amendlapacs and says the patient is appropriate for outpatient follow-up. Initial plan was to admit the patient. However, he is feeling better and deemed appropriate for outpatient follow-up by psychiatry. Patient will be discharged home. Not on commitment. Elevated ammonia and the patient at his baseline mental status. Has follow-up appointment with Dr. Toni Amendlapacs this coming Tuesday.    Myrna BlazerSchaevitz, Iseah Plouff Matthew, MD 02/04/17 [phone]

## 2017-02-04 NOTE — BH Assessment (Signed)
Per Dr. Toni Amendlapacs, pt no longer meets criteria for inpatient admission and is recommended for d/c.

## 2017-02-04 NOTE — ED Notes (Signed)
Pt sleeping. Breakfast tray placed at sink area.

## 2017-02-04 NOTE — BH Assessment (Signed)
[facility] by Dr. Toni Amendlapacs.  Attending Physician will be Dr. Ardyth HarpsHernandez.   Patient has been assigned to room 314, by [facility] Charge Nurse Gwen.

## 2017-02-04 NOTE — Consult Note (Signed)
[facility] Consult   Reason for Consult:  Consult for 36 year old man with a history of schizophrenia who came voluntarily into the emergency room because of worsening psychosis Referring Physician:  [place] Patient Identification: Howard [name] MRN:  [id number] Principal Diagnosis: Paranoid schizophrenia ([place]) Diagnosis:   Patient Active Problem List   Diagnosis Date Noted  . Nausea [R11.0] 01/18/2017  . Respiratory failure with hypoxia ([place]) [J96.91] 12/14/2016  . Seizures ([place]) [R56.9] 12/12/2016  . DNR (do not resuscitate) discussion [Z71.89] 08/11/2016  . Muscle weakness (generalized) [M62.81]   . GI bleed [K92.2] 08/07/2016  . OSA (obstructive sleep apnea) [G47.33] 04/29/2016  . Anemia [D64.9] 04/29/2016  . Thrombocytopenia ([place]) [D69.6] 04/29/2016  . Coagulopathy ([place]) [D68.9] 04/29/2016  . Obesity [E66.9] 04/29/2016  . Controlled type 2 diabetes mellitus without complication ([place]) [G18.2] 01/15/2016  . Essential (primary) hypertension [I10] 12/11/2015  . Pure hypercholesterolemia [E78.00] 12/11/2015  . Hepatic encephalopathy ([place]) [K72.90] 10/16/2015  . Alcoholic cirrhosis of liver with ascites ([place]) [K70.31] 07/19/2015  . Elevated transaminase level [R74.0] 07/04/2015  . Alcohol abuse [F10.10] 05/31/2015  . Paranoid schizophrenia ([place]) [F20.0] 03/20/2015    Total Time spent with patient: 20 minutes  Subjective:   Howard [name] is a 36 y.o. male patient admitted with "it's not any better".  Follow-up evaluation for Wednesday the 16th. Patient seen in the emergency room. No beds were available to admit him yesterday. He has been compliant with medicine overnight. He reports that he is feeling better today. The voices are improved. He denies any active suicidal thought or worry that he is going to act out on it. His affect is blunted as usual but he manages to force a smile. He has not been acting out aggressively or trying to hurt himself. He shows good  insight and has been compliant with treatment.  HPI:  Patient seen and interviewed. Chart reviewed. Patient well known to me in fact I just saw him earlier this afternoon in my office. This is a 36 year old man with schizophrenia and alcohol abuse. He came to his appointment today reporting that his hallucinations were still going on almost constantly. He could hear voices most of the time and frequently they were telling him to kill himself. He has been compliant with prescribed medicine. Patient's mood is sad and down and anxious all the time. He is also getting more irritable at home with his family. Sleep is poor. Appetite is poor. Generally feels weak. No thoughts however of hurting anyone else. I had seen him last week and changed his antipsychotic slightly but he has not had any improvement. He continues to drink alcohol several times a week including last night. Denies any other drug abuse.  Medical history: Overweight. Patient has been abusing alcohol for the last few years and although he has cut down considerably he also has developed cirrhosis with multiple complications. Has had hospitalizations for hepatic encephalopathy. Has mild diabetes and high blood pressure as well.  Substance abuse history: Started drinking just a few years ago but was drinking very heavily. Eventually cut down and has had some brief periods of stopping entirely but has not managed to stay completely sober. Continues to drink 3 or 4 nights a week often several beers at a time. No other drug use.  Social history: Not married. Lives with his mother and brother. Little activity outside the home.  Past Psychiatric History: Long-standing schizophrenia which used to be fairly well controlled on medication. He had taken olanzapine alone  for years with good control. After his alcohol problem got out of control things of gone downhill. Recently I've had more trouble controlling his psychosis. He has a one history of a distant  suicide attempt as an adolescent. He has had hospitalizations but for the most part is managed as an outpatient. Having the voices tell him to kill himself seems to be a newer complication.  Risk to Self: Suicidal Ideation: Yes-Currently Present Suicidal Intent: Yes-Currently Present Is patient at risk for suicide?: No Suicidal Plan?: No Specify Current Suicidal Plan: None Reported Access to Means: No Specify Access to Suicidal Means: N/A What has been your use of drugs/alcohol within the last 12 months?: Alcohol How many times?: 0 Other Self Harm Risks: None Reported Triggers for Past Attempts: None known Intentional Self Injurious Behavior: Cutting Comment - Self Injurious Behavior: Pt cuts himself in order to stop the voices Risk to Others: Homicidal Ideation: No Thoughts of Harm to Others: No Current Homicidal Intent: No Current Homicidal Plan: No Access to Homicidal Means: No Identified Victim: None Reported History of harm to others?: No Assessment of Violence: None Noted Violent Behavior Description: N/A Does patient have access to weapons?: No Criminal Charges Pending?: No Does patient have a court date: No Prior Inpatient Therapy: Prior Inpatient Therapy: Yes Prior Therapy Dates: 2017, 12/2016 Prior Therapy Facilty/Provider(s): [facility] Reason for Treatment: Psychosis Prior Outpatient Therapy: Prior Outpatient Therapy: Yes Prior Therapy Dates: Current Prior Therapy Facilty/Provider(s): Dr. Weber Cooks Reason for Treatment: Med Mgt Does patient have an ACCT team?: No Does patient have Intensive In-House Services?  : No Does patient have [facility] services? : No Does patient have P4CC services?: No  Past Medical History:  Past Medical History:  Diagnosis Date  . Alcoholic cirrhosis of liver with ascites ([place])   . Alcoholism ([place])   . Anemia   . COPD (chronic obstructive pulmonary disease) ([place])   . Depression   . Diabetes mellitus, type II ([place])   . Esophageal varices  ([place])   . Heart disease    irregular heart beat (palpitations) and heart murmur  . Hyperlipemia   . Hypertension   . Liver disease   . Multiple thyroid nodules   . Portal hypertensive gastropathy ([place])   . Schizophrenia ([place])   . Seizures ([place])     Past Surgical History:  Procedure Laterality Date  . ESOPHAGOGASTRODUODENOSCOPY N/A 10/05/2015   Procedure: ESOPHAGOGASTRODUODENOSCOPY (EGD);  Surgeon: Josefine Class, MD;  Location[facility] ENDOSCOPY;  Service: Endoscopy;  Laterality: N/A;  . ESOPHAGOGASTRODUODENOSCOPY (EGD) WITH PROPOFOL N/A 08/03/2015   Procedure: ESOPHAGOGASTRODUODENOSCOPY (EGD) WITH PROPOFOL;  Surgeon: Josefine Class, MD;  Location: [facility] ENDOSCOPY;  Service: Endoscopy;  Laterality: N/A;  . ESOPHAGOGASTRODUODENOSCOPY (EGD) WITH PROPOFOL N/A 08/31/2015   Procedure: ESOPHAGOGASTRODUODENOSCOPY (EGD) WITH PROPOFOL;  Surgeon: Josefine Class, MD;  Location: [facility] ENDOSCOPY;  Service: Endoscopy;  Laterality: N/A;  . ESOPHAGOGASTRODUODENOSCOPY (EGD) WITH PROPOFOL N/A 04/04/2016   Procedure: ESOPHAGOGASTRODUODENOSCOPY (EGD) WITH PROPOFOL;  Surgeon: Manya Silvas, MD;  Location[facility] ENDOSCOPY;  Service: Endoscopy;  Laterality: N/A;  . ESOPHAGOGASTRODUODENOSCOPY (EGD) WITH PROPOFOL N/A 11/13/2016   Procedure: ESOPHAGOGASTRODUODENOSCOPY (EGD) WITH PROPOFOL;  Surgeon: Jonathon Bellows, MD;  Location: [facility] ENDOSCOPY;  Service: Endoscopy;  Laterality: N/A;  . NO PAST SURGERIES     Family History:  Family History  Problem Relation Age of Onset  . Heart disease Mother   . Hypertension Mother   . Hyperlipidemia Mother   . Stroke Father   . Heart attack Father   .  Hypertension Father   . Heart disease Father   . Alcohol abuse Father   . Heart disease Brother    Family Psychiatric  History: Positive for some psychosis and substance abuse in other family members Social History:  History  Alcohol Use  . 3.6 oz/week  . 6 Cans of beer per week    Comment: "one quart" 2-3 times  per week     History  Drug Use No    Social History   Social History  . Marital status: Single    Spouse name: N/A  . Number of children: N/A  . Years of education: N/A   Occupational History  . disabled    Social History Main Topics  . Smoking status: Never Smoker  . Smokeless tobacco: Never Used  . Alcohol use 3.6 oz/week    6 Cans of beer per week     Comment: "one quart" 2-3 times per week  . Drug use: No  . Sexual activity: No   Other Topics Concern  . None   Social History Narrative   ** Merged History Encounter **       Additional Social History:    Allergies:  No Known Allergies  Labs:  Results for orders placed or performed during the hospital encounter of 02/03/17 (from the past 48 hour(s))  Comprehensive metabolic panel     Status: Abnormal   Collection Time: 02/03/17  2:02 PM  Result Value Ref Range   Sodium 139 135 - 145 mmol/L   Potassium 4.1 3.5 - 5.1 mmol/L   Chloride 99 (L) 101 - 111 mmol/L   CO2 31 22 - 32 mmol/L   Glucose, Bld 142 (H) 65 - 99 mg/dL   BUN 7 6 - 20 mg/dL   Creatinine, Ser 1.01 0.61 - 1.24 mg/dL   Calcium 9.2 8.9 - 10.3 mg/dL   Total Protein 8.1 6.5 - 8.1 g/dL   Albumin 3.6 3.5 - 5.0 g/dL   AST 52 (H) 15 - 41 U/L   ALT 29 17 - 63 U/L   Alkaline Phosphatase 147 (H) 38 - 126 U/L   Total Bilirubin 0.8 0.3 - 1.2 mg/dL   GFR calc non Af Amer >60 >60 mL/min   GFR calc Af Amer >60 >60 mL/min    Comment: (NOTE) The eGFR has been calculated using the CKD EPI equation. This calculation has not been validated in all clinical situations. eGFR's persistently <60 mL/min signify possible Chronic Kidney Disease.    Anion gap 9 5 - 15  Ethanol     Status: None   Collection Time: 02/03/17  2:02 PM  Result Value Ref Range   Alcohol, Ethyl (B) <5 <5 mg/dL    Comment:        LOWEST DETECTABLE LIMIT FOR SERUM ALCOHOL IS 5 mg/dL FOR MEDICAL PURPOSES ONLY   Salicylate level     Status: None   Collection Time: 02/03/17  2:02 PM   Result Value Ref Range   Salicylate Lvl <3.3 2.8 - 30.0 mg/dL  Acetaminophen level     Status: Abnormal   Collection Time: 02/03/17  2:02 PM  Result Value Ref Range   Acetaminophen (Tylenol), Serum <10 (L) 10 - 30 ug/mL    Comment:        THERAPEUTIC CONCENTRATIONS VARY SIGNIFICANTLY. A RANGE OF 10-30 ug/mL MAY BE AN EFFECTIVE CONCENTRATION FOR MANY PATIENTS. HOWEVER, SOME ARE BEST TREATED AT CONCENTRATIONS OUTSIDE THIS RANGE. ACETAMINOPHEN CONCENTRATIONS >150 ug/mL AT 4 HOURS AFTER INGESTION  AND >50 ug/mL AT 12 HOURS AFTER INGESTION ARE OFTEN ASSOCIATED WITH TOXIC REACTIONS.   cbc     Status: Abnormal   Collection Time: 02/03/17  2:02 PM  Result Value Ref Range   WBC 6.9 3.8 - 10.6 K/uL   RBC 4.19 (L) 4.40 - 5.90 MIL/uL   Hemoglobin 10.8 (L) 13.0 - 18.0 g/dL   HCT 34.6 (L) 40.0 - 52.0 %   MCV 82.6 80.0 - 100.0 fL   MCH 25.9 (L) 26.0 - 34.0 pg   MCHC 31.3 (L) 32.0 - 36.0 g/dL   RDW 21.5 (H) 11.5 - 14.5 %   Platelets 121 (L) 150 - 440 K/uL  Urine Drug Screen, Qualitative     Status: None   Collection Time: 02/03/17  2:02 PM  Result Value Ref Range   Tricyclic, Ur Screen NONE DETECTED NONE DETECTED   Amphetamines, Ur Screen NONE DETECTED NONE DETECTED   MDMA (Ecstasy)Ur Screen NONE DETECTED NONE DETECTED   Cocaine Metabolite,Ur [place] NONE DETECTED NONE DETECTED   Opiate, Ur Screen NONE DETECTED NONE DETECTED   Phencyclidine (PCP) Ur S NONE DETECTED NONE DETECTED   Cannabinoid 50 Ng, Ur [place] NONE DETECTED NONE DETECTED   Barbiturates, Ur Screen NONE DETECTED NONE DETECTED   Benzodiazepine, Ur Scrn NONE DETECTED NONE DETECTED   Methadone Scn, Ur NONE DETECTED NONE DETECTED    Comment: (NOTE) 268  Tricyclics, urine               Cutoff 1000 ng/mL 200  Amphetamines, urine             Cutoff 1000 ng/mL 300  MDMA (Ecstasy), urine           Cutoff 500 ng/mL 400  Cocaine Metabolite, urine       Cutoff 300 ng/mL 500  Opiate, urine                   Cutoff 300 ng/mL 600   Phencyclidine (PCP), urine      Cutoff 25 ng/mL 700  Cannabinoid, urine              Cutoff 50 ng/mL 800  Barbiturates, urine             Cutoff 200 ng/mL 900  Benzodiazepine, urine           Cutoff 200 ng/mL 1000 Methadone, urine                Cutoff 300 ng/mL 1100 1200 The urine drug screen provides only a preliminary, unconfirmed 1300 analytical test result and should not be used for non-medical 1400 purposes. Clinical consideration and professional judgment should 1500 be applied to any positive drug screen result due to possible 1600 interfering substances. A more specific alternate chemical method 1700 must be used in order to obtain a confirmed analytical result.  1800 Gas chromato graphy / mass spectrometry (GC/MS) is the preferred 1900 confirmatory method.   Ammonia     Status: Abnormal   Collection Time: 02/03/17  3:08 PM  Result Value Ref Range   Ammonia 62 (H) 9 - 35 umol/L    Current Facility-Administered Medications  Medication Dose Route Frequency Provider Last Rate Last Dose  . acamprosate (CAMPRAL) tablet 666 mg  666 mg Oral TID WC Bethaney Oshana, Madie Reno, MD   666 mg at 02/04/17 1259  . albuterol (PROVENTIL HFA;VENTOLIN HFA) 108 (90 Base) MCG/ACT inhaler 2 puff  2 puff Inhalation Q4H PRN Torey Reinard, Madie Reno, MD      .  citalopram (CELEXA) tablet 20 mg  20 mg Oral Daily Phallon Haydu, Madie Reno, MD   20 mg at 02/04/17 0911  . folic acid (FOLVITE) tablet 1 mg  1 mg Oral Daily Lakeisha Waldrop,  T, MD   1 mg at 02/04/17 9326  . furosemide (LASIX) tablet 20 mg  20 mg Oral Daily Kemberly Taves, Madie Reno, MD   20 mg at 02/04/17 0909  . lactulose (CHRONULAC) 10 GM/15ML solution 45 g  45 g Oral TID Lestat Golob, Madie Reno, MD   45 g at 02/04/17 0911  . levETIRAcetam (KEPPRA) tablet 1,500 mg  1,500 mg Oral BID Dorissa Stinnette, Madie Reno, MD   1,500 mg at 02/04/17 0909  . LORazepam (ATIVAN) tablet 1 mg  1 mg Oral Q6H PRN Calene Paradiso, Madie Reno, MD   1 mg at 02/03/17 1726   Or  . LORazepam (ATIVAN) injection 1 mg  1 mg Intravenous Q6H  PRN Ivo Moga T, MD      . losartan (COZAAR) tablet 25 mg  25 mg Oral Daily Naol Ontiveros T, MD   25 mg at 02/04/17 0910  . lurasidone (LATUDA) tablet 40 mg  40 mg Oral Q supper Isyss Espinal,  T, MD      . lurasidone (LATUDA) tablet 80 mg  80 mg Oral Q supper Raynell Scott, Madie Reno, MD   80 mg at 02/03/17 1726  . metFORMIN (GLUCOPHAGE) tablet 500 mg  500 mg Oral BID WC Caleesi Kohl, Madie Reno, MD   500 mg at 02/04/17 7124  . multivitamin with minerals tablet 1 tablet  1 tablet Oral Daily Isamu Trammel, Madie Reno, MD   1 tablet at 02/04/17 0911  . nadolol (CORGARD) tablet 20 mg  20 mg Oral Daily Derreon Consalvo, Madie Reno, MD   20 mg at 02/04/17 0914  . rifaximin (XIFAXAN) tablet 550 mg  550 mg Oral BID Linsy Ehresman, Madie Reno, MD   550 mg at 02/04/17 0915  . spironolactone (ALDACTONE) tablet 50 mg  50 mg Oral BID Capri Raben, Madie Reno, MD   50 mg at 02/04/17 0915  . sucralfate (CARAFATE) tablet 1 g  1 g Oral TID WC & HS , Madie Reno, MD   1 g at 02/04/17 1259  . thiamine (VITAMIN B-1) tablet 100 mg  100 mg Oral Daily Taila Basinski T, MD   100 mg at 02/04/17 5809   Or  . thiamine (B-1) injection 100 mg  100 mg Intravenous Daily Belvie Iribe T, MD      . traZODone (DESYREL) tablet 100 mg  100 mg Oral QHS , Madie Reno, MD   100 mg at 02/04/17 9833   Current Outpatient Prescriptions  Medication Sig Dispense Refill  . acamprosate (CAMPRAL) 333 MG tablet Take 2 tablets (666 mg total) by mouth 3 (three) times daily with meals. 180 tablet 5  . albuterol (PROVENTIL HFA;VENTOLIN HFA) 108 (90 Base) MCG/ACT inhaler Inhale 2 puffs into the lungs every 6 (six) hours as needed for wheezing or shortness of breath. 1 Inhaler 2  . docusate sodium (COLACE) 100 MG capsule Take 100 mg by mouth daily as needed for mild constipation.    . ferrous sulfate 325 (65 FE) MG tablet Take 3 tablets (975 mg total) by mouth every Monday, Wednesday, and Friday. With breakfast 36 tablet 5  . folic acid (FOLVITE) 1 MG tablet Take 1 tablet (1 mg total) by mouth daily.  30 tablet 0  . furosemide (LASIX) 20 MG tablet Take 1 tablet by mouth daily.    . haloperidol (HALDOL)  5 MG tablet Take 1 tablet (5 mg total) by mouth 2 (two) times daily. 60 tablet 1  . lactulose (CHRONULAC) 10 GM/15ML solution Take 45 mLs (30 g total) by mouth every 4 (four) hours. (Patient taking differently: Take 30 g by mouth 4 (four) times daily. ) 240 mL 0  . levETIRAcetam (KEPPRA) 750 MG tablet Take 2 tablets (1,500 mg total) by mouth 2 (two) times daily. 120 tablet 0  . metFORMIN (GLUCOPHAGE) 500 MG tablet Take 1 tablet (500 mg total) by mouth 2 (two) times daily with a meal. 60 tablet 0  . rifaximin (XIFAXAN) 550 MG TABS tablet Take 1 tablet (550 mg total) by mouth 2 (two) times daily. 60 tablet 5  . spironolactone (ALDACTONE) 50 MG tablet Take 1 tablet (50 mg total) by mouth 2 (two) times daily. 60 tablet 5  . thiamine (VITAMIN B-1) 100 MG tablet Take 100 mg by mouth daily.    . traZODone (DESYREL) 100 MG tablet Take 100 mg by mouth at bedtime as needed for sleep.     . citalopram (CELEXA) 20 MG tablet Take 1 tablet (20 mg total) by mouth every morning. (Patient not taking: Reported on 02/03/2017) 30 tablet 5  . clonazePAM (KLONOPIN) 0.5 MG tablet Take 1 tablet (0.5 mg total) by mouth 2 (two) times daily as needed for anxiety. 60 tablet 0  . losartan (COZAAR) 25 MG tablet Take 25 mg by mouth daily.    Marland Kitchen lurasidone (LATUDA) 80 MG TABS tablet Take 1 tablet (80 mg total) by mouth daily with supper. 30 tablet 0  . nadolol (CORGARD) 20 MG tablet Take 20 mg by mouth daily.    . NON FORMULARY       Musculoskeletal: Strength & Muscle Tone: decreased Gait & Station: normal Patient leans: N/A  Psychiatric Specialty Exam: Physical Exam  Nursing note and vitals reviewed. Constitutional: He appears well-developed and well-nourished.  HENT:  Head: Normocephalic and atraumatic.  Eyes: Conjunctivae are normal. Pupils are equal, round, and reactive to light.  Neck: Normal range of motion.   Cardiovascular: Normal heart sounds.   Respiratory: Effort normal.  GI: Soft. He exhibits distension.  Musculoskeletal: Normal range of motion.  Neurological: He is alert.  Skin: Skin is warm and dry.  Psychiatric: Judgment normal. His affect is blunt. His speech is delayed. He is slowed. He is not withdrawn and not actively hallucinating. Cognition and memory are impaired. He expresses no suicidal ideation.    Review of Systems  HENT: Negative.   Eyes: Negative.   Respiratory: Negative.   Cardiovascular: Negative.   Gastrointestinal: Negative.   Musculoskeletal: Negative.   Skin: Negative.   Neurological: Positive for tremors and weakness.  Psychiatric/Behavioral: Positive for substance abuse. Negative for depression, hallucinations, memory loss and suicidal ideas. The patient is not nervous/anxious and does not have insomnia.     Blood pressure 138/75, pulse 88, temperature 97.4 F (36.3 C), temperature source Oral, resp. rate 16, height _0  (1.905 m), weight (!) 158.8 kg (350 lb), SpO2 92 %.Body mass index is 43.75 kg/m.  General Appearance: Casual  Eye Contact:  Fair  Speech:  Slow  Volume:  Decreased  Mood:  Euthymic  Affect:  Blunt  Thought Process:  Goal Directed  Orientation:  Full (Time, Place, and Person)  Thought Content:  Logical and Rumination  Suicidal Thoughts:  No  Homicidal Thoughts:  No  Memory:  Immediate;   Fair Recent;   Poor Remote;   Fair  Judgement:  Fair  Insight:  Fair  Psychomotor Activity:  Decreased  Concentration:  Concentration: Fair  Recall:  [employer] of Knowledge:  Fair  Language:  Fair  Akathisia:  No  Handed:  Right  AIMS (if indicated):     Assets:  Housing Social Support  ADL's:  Intact  Cognition:  Impaired,  Mild  Sleep:        Treatment Plan Summary: Daily contact with patient to assess and evaluate symptoms and progress in treatment and Plan 36 year old man with schizophrenia and alcohol abuse well known to me from  years of treatment. On evaluation today he is reporting that he feels a bit better. Feels much more confident about being safe at home. They stole his medical needs particularly his oxygen the chances of getting him admitted to the psychiatric ward here are essentially 0 and it will be difficult to refer him anywhere else. I discussed this with the patient and also discussed with him that I am happy to see him back in my office next week. He is willing to continue the current Latuda dose. He is requesting "something for her nerves". I counseled him that benzodiazepines are potentially addictive and abusable and he has to be very careful not to overuse it but I am willing to give him clonazepam a half milligram twice a day as needed for anxiety to see if that helps. Patient reminded that he absolutely has to stay off the alcohol. Otherwise I think he is safe for discharge and I will let him go home he. He will follow-up with me next Tuesday.  Disposition: Recommend psychiatric Inpatient admission when medically cleared. Supportive therapy provided about ongoing stressors.  Alethia Berthold, MD 02/04/2017 4:17 PM

## 2017-02-04 NOTE — BH Assessment (Signed)
Pt provided to charge RN to review for possible [facility] placement. 

## 2017-02-04 NOTE — ED Notes (Signed)
Pt given breakfast tray and hooked up to wall oxygen

## 2017-02-10 ENCOUNTER — Ambulatory Visit (INDEPENDENT_AMBULATORY_CARE_PROVIDER_SITE_OTHER): Payer: Medicare Other | Admitting: Psychiatry

## 2017-02-10 ENCOUNTER — Encounter: Payer: Self-pay | Admitting: Psychiatry

## 2017-02-10 VITALS — BP 116/74 | HR 85 | Temp 97.9°F | Wt 351.0 lb

## 2017-02-10 DIAGNOSIS — F1010 Alcohol abuse, uncomplicated: Secondary | ICD-10-CM | POA: Diagnosis not present

## 2017-02-10 DIAGNOSIS — F200 Paranoid schizophrenia: Secondary | ICD-10-CM | POA: Diagnosis not present

## 2017-02-10 NOTE — Progress Notes (Signed)
Follow-up 36 year old man with schizophrenia and alcohol abuse. Since last week he says he is feeling a little better. His hallucinations are less. He is grinding his teeth less. He admits that he had alcohol on 2 occasions but only twice. His mood has been stable and he feels less irritable.  Casually but neatly dressed. Lucid. Calm. Not agitated. Denies suicidal or homicidal thoughts. Not tearful. Denies any acute hallucinations.  No evidence of misuse of medicine. Patient says he is using the Klonopin only sparingly. We will continue current medicine as prescribed. Off of the Haldol and on the Latuda. We reviewed the importance of staying away from alcohol and continuing to take his lactulose. I will see him back in 2 weeks.

## 2017-02-10 NOTE — Progress Notes (Deleted)
[facility] [place] [facility] Medicine Consultation      Assessment and Plan:  Excessive daytime sleepiness.  -Symptoms and signs of sleep apnea, including loud snoring, excessive daytime sleepiness, witnessed apneas. -Will send for sleep study.  Dyspnea with chronic respiratory failure.  -Multifactorial, likely due to combination of restrictive lung disease, possible pulmonary hypertension, as well as possible hypoxia secondary to cirrhosis. -We'll check a full pulmonary function test with shunt study, repeat echocardiogram with reporting of ventricular systolic pressures to rule out pulmonary hypertension  Cirrhosis.  -Discussed the importance of cessation, as continued drinking well kasa cirrhosis to worsen and will likely continue.  Obesity. -This is likely contributing to the patient's dyspnea, possible sleep apnea, respiratory failure, and debility. -Weight loss recommended.   Date: 02/10/2017  MRN# [id number] GORAN OLDEN 08-Jun-1981  Referring Physician:   Andee Lineman is a 36 y.o. old male seen in consultation for chief complaint of:    No chief complaint on file.   HPI:   The patient is a 36 year old Caucasian male with a history significant for history of alcoholic liver cirrhosis with ascites, ongoing alcohol abuse, thrombocytopenia, COPD, depression, diabetes, esophageal varices, hyperlipidemia, hypertension, schizophrenia. He was recently discharged from the hospital on 07/09/16 from hepatic cirrhosis. At that time it was noted that he had hypercapnic respiratory failure with possible sleep apnea, and thus asked to follow-up with pulmonary. Since his discharge she was back in the ER on 07/21/16 with a reported seizure, though the patient eloped before he could be seen by a physician.  He was discharged with oxygen, he was not wearing it today and his oxygen was about 80% when he walked in today. He is not very active, he is not a smoker. He gets winded with mild activities,  such as getting out of bed or walking around his house.  He is very sedentary and does little activity. He has gained a lot of weight, about 100 lbs in past 5 years.   He drinks every day 2- 40 oz beers per day. He used to drink vodka but his mother made him stop that. He has schizophrenia and is on zyprexia. He is on keppra for seizures.   Mother notes that he snores at night very loudly, and sometimes stops breathing in his sleep.   07/31/16; ambulated 200 feet with desat to 87% on 2L; minimal dyspnea, wandering gait.   Echocardiogram 07/09/16; EF=65%, mild RV dilation, no pressures reported.   Medication:   Reviewed.    Allergies:  Patient has no known allergies.  Review of Systems: Gen:  Denies  fever, sweats, chills HEENT: Denies blurred vision, double vision. bleeds, sore throat Cvc:  No dizziness, chest pain. Resp:   Denies cough or sputum production. Gi: Denies swallowing difficulty, stomach pain. Gu:  Denies bladder incontinence, burning urine Ext:   No Joint pain, stiffness. Skin: No skin rash,  hives  Endoc:  No polyuria, polydipsia. Psych: No depression, insomnia. Other:  All other systems were reviewed with the patient and were negative other that what is mentioned in the HPI.   Physical Examination:   VS: There were no vitals taken for this visit.  General Appearance: No distress , oriented obesity. Neuro:without focal findings,  speech normal,  HEENT: PERRLA, EOM intact.   Pulmonary: normal breath sounds, No wheezing. Decreased air entry bilaterally. CardiovascularNormal S1,S2.  No m/r/g.   Abdomen: Benign, Soft, non-tender. Distended, obese. Renal:  No costovertebral tenderness  GU:  No performed at this time.  Endoc: No evident thyromegaly, no signs of acromegaly. Skin:   warm, no rashes, no ecchymosis  Extremities: normal, no cyanosis, clubbing.  Other findings:    LABORATORY PANEL:   CBC No results for input(s): WBC, HGB, HCT, PLT in the last 168  hours. ------------------------------------------------------------------------------------------------------------------  Chemistries  No results for input(s): NA, K, CL, CO2, GLUCOSE, BUN, CREATININE, CALCIUM, MG, AST, ALT, ALKPHOS, BILITOT in the last 168 hours.  Invalid input(s): GFRCGP ------------------------------------------------------------------------------------------------------------------  Cardiac Enzymes No results for input(s): TROPONINI in the last 168 hours. ------------------------------------------------------------  RADIOLOGY:  No results found.     Thank  you for the consultation and for allowing [facility] to assist in the care of your patient. Our recommendations are noted above.  Please contact us if we can be of further service.   Wells Guileseep Janisha Bueso, MD.  Board Certified in Internal Medicine, Pulmonary Medicine, Critical Care Medicine, and Sleep Medicine.  [facility] Office Number: [phone]  Santiago Gladavid Kasa, M.D.  Stephanie AcreVishal Mungal, M.D.  Billy Fischeravid Simonds, M.D  02/10/2017

## 2017-02-12 ENCOUNTER — Ambulatory Visit: Payer: Medicare Other | Admitting: Psychiatry

## 2017-02-12 ENCOUNTER — Encounter: Payer: Self-pay | Admitting: Internal Medicine

## 2017-02-12 ENCOUNTER — Inpatient Hospital Stay: Payer: Medicare Other | Admitting: Internal Medicine

## 2017-02-16 DIAGNOSIS — D500 Iron deficiency anemia secondary to blood loss (chronic): Secondary | ICD-10-CM | POA: Insufficient documentation

## 2017-02-16 NOTE — Progress Notes (Deleted)
[facility]  [phone] [phone]  ID: Howard [name] OB: December 17, 1980  MR#: [id number]  AOZ#:[id number]  Patient Care Team: [name], NP as PCP - General (Nurse Practitioner)  CHIEF COMPLAINT: Iron deficiency anemia.  INTERVAL HISTORY: ***  REVIEW OF SYSTEMS:   ROS  As per HPI. Otherwise, a complete review of systems is negative.  PAST MEDICAL HISTORY: Past Medical History:  Diagnosis Date  . Alcoholic cirrhosis of liver with ascites (HCC)   . Alcoholism (HCC)   . Anemia   . COPD (chronic obstructive pulmonary disease) (HCC)   . Depression   . Diabetes mellitus, type II (HCC)   . Esophageal varices (HCC)   . Heart disease    irregular heart beat (palpitations) and heart murmur  . Hyperlipemia   . Hypertension   . Liver disease   . Multiple thyroid nodules   . Portal hypertensive gastropathy (HCC)   . Schizophrenia (HCC)   . Seizures (HCC)     PAST SURGICAL HISTORY: Past Surgical History:  Procedure Laterality Date  . ESOPHAGOGASTRODUODENOSCOPY N/A 10/05/2015   Procedure: ESOPHAGOGASTRODUODENOSCOPY (EGD);  Surgeon: Elnita Maxwell, MD;  Location: [facility] ENDOSCOPY;  Service: Endoscopy;  Laterality: N/A;  . ESOPHAGOGASTRODUODENOSCOPY (EGD) WITH PROPOFOL N/A 08/03/2015   Procedure: ESOPHAGOGASTRODUODENOSCOPY (EGD) WITH PROPOFOL;  Surgeon: Elnita Maxwell, MD;  Location: [facility] ENDOSCOPY;  Service: Endoscopy;  Laterality: N/A;  . ESOPHAGOGASTRODUODENOSCOPY (EGD) WITH PROPOFOL N/A 08/31/2015   Procedure: ESOPHAGOGASTRODUODENOSCOPY (EGD) WITH PROPOFOL;  Surgeon: Elnita Maxwell, MD;  Location: [facility] ENDOSCOPY;  Service: Endoscopy;  Laterality: N/A;  . ESOPHAGOGASTRODUODENOSCOPY (EGD) WITH PROPOFOL N/A 04/04/2016   Procedure: ESOPHAGOGASTRODUODENOSCOPY (EGD) WITH PROPOFOL;  Surgeon: Scot Jun, MD;  Location: [facility] ENDOSCOPY;  Service: Endoscopy;  Laterality: N/A;  . ESOPHAGOGASTRODUODENOSCOPY (EGD) WITH PROPOFOL  N/A 11/13/2016   Procedure: ESOPHAGOGASTRODUODENOSCOPY (EGD) WITH PROPOFOL;  Surgeon: Wyline Mood, MD;  Location: [facility] ENDOSCOPY;  Service: Endoscopy;  Laterality: N/A;  . NO PAST SURGERIES      FAMILY HISTORY: Family History  Problem Relation Age of Onset  . Heart disease Mother   . Hypertension Mother   . Hyperlipidemia Mother   . Stroke Father   . Heart attack Father   . Hypertension Father   . Heart disease Father   . Alcohol abuse Father   . Heart disease Brother     ADVANCED DIRECTIVES (Y/N):  N  HEALTH MAINTENANCE: Social History  Substance Use Topics  . Smoking status: Never Smoker  . Smokeless tobacco: Never Used  . Alcohol use 3.6 oz/week    6 Cans of beer per week     Comment: "one quart" 2-3 times per week     Colonoscopy:  PAP:  Bone density:  Lipid panel:  No Known Allergies  Current Outpatient Prescriptions  Medication Sig Dispense Refill  . acamprosate (CAMPRAL) 333 MG tablet Take 2 tablets (666 mg total) by mouth 3 (three) times daily with meals. 180 tablet 5  . albuterol (PROVENTIL HFA;VENTOLIN HFA) 108 (90 Base) MCG/ACT inhaler Inhale 2 puffs into the lungs every 6 (six) hours as needed for wheezing or shortness of breath. 1 Inhaler 2  . citalopram (CELEXA) 20 MG tablet Take 1 tablet (20 mg total) by mouth every morning. 30 tablet 5  . clonazePAM (KLONOPIN) 0.5 MG tablet Take 1 tablet (0.5 mg total) by mouth 2 (two) times daily as needed for anxiety. 60 tablet 0  . docusate sodium (COLACE) 100 MG capsule Take 100 mg by mouth daily  as needed for mild constipation.    . ferrous sulfate 325 (65 FE) MG tablet Take 3 tablets (975 mg total) by mouth every Monday, Wednesday, and Friday. With breakfast 36 tablet 5  . folic acid (FOLVITE) 1 MG tablet Take 1 tablet (1 mg total) by mouth daily. 30 tablet 0  . furosemide (LASIX) 20 MG tablet Take 1 tablet by mouth daily.    . haloperidol (HALDOL) 5 MG tablet Take 1 tablet (5 mg total) by mouth 2 (two) times daily.  60 tablet 1  . lactulose (CHRONULAC) 10 GM/15ML solution Take 45 mLs (30 g total) by mouth every 4 (four) hours. (Patient taking differently: Take 30 g by mouth 4 (four) times daily. ) 240 mL 0  . levETIRAcetam (KEPPRA) 750 MG tablet Take 2 tablets (1,500 mg total) by mouth 2 (two) times daily. 120 tablet 0  . losartan (COZAAR) 25 MG tablet Take 25 mg by mouth daily.    Marland Kitchen. lurasidone (LATUDA) 80 MG TABS tablet Take 1 tablet (80 mg total) by mouth daily with supper. 30 tablet 0  . metFORMIN (GLUCOPHAGE) 500 MG tablet Take 1 tablet (500 mg total) by mouth 2 (two) times daily with a meal. 60 tablet 0  . nadolol (CORGARD) 20 MG tablet Take 20 mg by mouth daily.    . NON FORMULARY     . rifaximin (XIFAXAN) 550 MG TABS tablet Take 1 tablet (550 mg total) by mouth 2 (two) times daily. 60 tablet 5  . spironolactone (ALDACTONE) 50 MG tablet Take 1 tablet (50 mg total) by mouth 2 (two) times daily. 60 tablet 5  . thiamine (VITAMIN B-1) 100 MG tablet Take 100 mg by mouth daily.    . traZODone (DESYREL) 100 MG tablet Take 100 mg by mouth at bedtime as needed for sleep.      Current Facility-Administered Medications  Medication Dose Route Frequency Provider Last Rate Last Dose  . lurasidone (LATUDA) tablet 40 mg  40 mg Oral Q supper [name], Jackquline DenmarkJohn T, MD        OBJECTIVE: There were no vitals filed for this visit.   There is no height or weight on file to calculate BMI.    ECOG FS:{CHL ONC [id number]:[phone]}  General: Well-developed, well-nourished, no acute distress. Eyes: Pink conjunctiva, anicteric sclera. HEENT: Normocephalic, moist mucous membranes, clear oropharnyx. Lungs: Clear to auscultation bilaterally. Heart: Regular rate and rhythm. No rubs, murmurs, or gallops. Abdomen: Soft, nontender, nondistended. No organomegaly noted, normoactive bowel sounds. Musculoskeletal: No edema, cyanosis, or clubbing. Neuro: Alert, answering all questions appropriately. Cranial nerves grossly intact. Skin: No  rashes or petechiae noted. Psych: Normal affect. Lymphatics: No cervical, calvicular, axillary or inguinal LAD.   LAB RESULTS:  Lab Results  Component Value Date   NA 139 02/03/2017   K 4.1 02/03/2017   CL 99 (L) 02/03/2017   CO2 31 02/03/2017   GLUCOSE 142 (H) 02/03/2017   BUN 7 02/03/2017   CREATININE 1.01 02/03/2017   CALCIUM 9.2 02/03/2017   PROT 8.1 02/03/2017   ALBUMIN 3.6 02/03/2017   AST 52 (H) 02/03/2017   ALT 29 02/03/2017   ALKPHOS 147 (H) 02/03/2017   BILITOT 0.8 02/03/2017   GFRNONAA >60 02/03/2017   GFRAA >60 02/03/2017    Lab Results  Component Value Date   WBC 6.9 02/03/2017   NEUTROABS 4.1 12/15/2016   HGB 10.8 (L) 02/03/2017   HCT 34.6 (L) 02/03/2017   MCV 82.6 02/03/2017   PLT 121 (L) 02/03/2017  STUDIES: Dg Chest Port 1 View  Result Date: 01/18/2017 CLINICAL DATA:  Hypoxia EXAM: PORTABLE CHEST 1 VIEW COMPARISON:  12/29/2016 FINDINGS: The heart size and mediastinal contours are within normal limits. Both lungs are clear. The visualized skeletal structures are unremarkable. IMPRESSION: No active disease. Electronically Signed   By: Elige Ko   On: 01/18/2017 15:27    ASSESSMENT: Iron deficiency anemia.  PLAN:    1. Iron deficiency anemia:  Patient expressed understanding and was in agreement with this plan. He also understands that He can call clinic at any time with any questions, concerns, or complaints.   Cancer Staging No matching staging information was found for the patient.  Jeralyn Ruths, MD   02/16/2017 11:20 PM

## 2017-02-17 ENCOUNTER — Inpatient Hospital Stay: Payer: Medicare Other | Admitting: Oncology

## 2017-02-24 ENCOUNTER — Encounter: Payer: Self-pay | Admitting: Psychiatry

## 2017-02-24 ENCOUNTER — Ambulatory Visit (INDEPENDENT_AMBULATORY_CARE_PROVIDER_SITE_OTHER): Payer: Medicare Other | Admitting: Psychiatry

## 2017-02-24 VITALS — BP 141/84 | HR 86 | Temp 97.7°F | Wt 348.6 lb

## 2017-02-24 DIAGNOSIS — F1010 Alcohol abuse, uncomplicated: Secondary | ICD-10-CM | POA: Diagnosis not present

## 2017-02-24 DIAGNOSIS — F200 Paranoid schizophrenia: Secondary | ICD-10-CM
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
## 2017-02-25 ENCOUNTER — Telehealth: Payer: Self-pay

## 2017-02-25 NOTE — Telephone Encounter (Signed)
pt needs refills on latuda 80mg  and on trazodone 100mg  pt was seen on 02-24-17

## 2017-02-26 ENCOUNTER — Ambulatory Visit: Payer: Self-pay | Admitting: Nurse Practitioner

## 2017-02-26 ENCOUNTER — Other Ambulatory Visit: Payer: Self-pay | Admitting: Psychiatry

## 2017-02-26 MED ORDER — TRAZODONE HCL 100 MG PO TABS: 100.0000 mg | ORAL_TABLET | Freq: Every evening | ORAL | 2 refills | Status: DC | PRN

## 2017-02-26 MED ORDER — LURASIDONE HCL 80 MG PO TABS: 80.0000 mg | ORAL_TABLET | Freq: Every day | ORAL | 0 refills | Status: DC

## 2017-03-02 ENCOUNTER — Other Ambulatory Visit: Payer: Self-pay

## 2017-03-02 MED ORDER — SPIRONOLACTONE 50 MG PO TABS: 50.0000 mg | ORAL_TABLET | Freq: Two times a day (BID) | ORAL | 5 refills | Status: DC

## 2017-03-10 ENCOUNTER — Encounter: Payer: Self-pay | Admitting: Psychiatry

## 2017-03-10 ENCOUNTER — Ambulatory Visit (INDEPENDENT_AMBULATORY_CARE_PROVIDER_SITE_OTHER): Payer: Medicare Other | Admitting: Psychiatry

## 2017-03-10 VITALS — BP 126/82 | HR 81 | Temp 98.5°F | Wt 348.4 lb

## 2017-03-10 DIAGNOSIS — F200 Paranoid schizophrenia: Secondary | ICD-10-CM

## 2017-03-10 DIAGNOSIS — F1010 Alcohol abuse, uncomplicated: Secondary | ICD-10-CM | POA: Diagnosis not present
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
## 2017-03-11 ENCOUNTER — Other Ambulatory Visit: Payer: Self-pay

## 2017-03-11 MED ORDER — NADOLOL 20 MG PO TABS: 20.0000 mg | ORAL_TABLET | Freq: Every day | ORAL | 2 refills | Status: DC

## 2017-03-11 MED ORDER — LOSARTAN POTASSIUM 25 MG PO TABS: 25.0000 mg | ORAL_TABLET | Freq: Every day | ORAL | 2 refills | Status: DC

## 2017-03-11 NOTE — Telephone Encounter (Signed)
Pharmacy requesting a refill.

## 2017-03-17 ENCOUNTER — Emergency Department
Admission: EM | Admit: 2017-03-17 | Discharge: 2017-03-17 | Disposition: A | Discharge status: Home / Self Care | Payer: Medicare Other | Attending: Emergency Medicine | Admitting: Emergency Medicine

## 2017-03-17 ENCOUNTER — Encounter: Payer: Self-pay | Admitting: Emergency Medicine

## 2017-03-17 DIAGNOSIS — J449 Chronic obstructive pulmonary disease, unspecified: Secondary | ICD-10-CM | POA: Diagnosis not present

## 2017-03-17 DIAGNOSIS — F2089 Other schizophrenia: Secondary | ICD-10-CM | POA: Diagnosis not present

## 2017-03-17 DIAGNOSIS — Z79899 Other long term (current) drug therapy: Secondary | ICD-10-CM | POA: Insufficient documentation

## 2017-03-17 DIAGNOSIS — I10 Essential (primary) hypertension: Secondary | ICD-10-CM | POA: Insufficient documentation

## 2017-03-17 DIAGNOSIS — E7220 Disorder of urea cycle metabolism, unspecified: Secondary | ICD-10-CM | POA: Diagnosis not present

## 2017-03-17 DIAGNOSIS — E119 Type 2 diabetes mellitus without complications: Secondary | ICD-10-CM | POA: Diagnosis not present

## 2017-03-17 DIAGNOSIS — K7030 Alcoholic cirrhosis of liver without ascites: Secondary | ICD-10-CM | POA: Insufficient documentation

## 2017-03-17 DIAGNOSIS — F209 Schizophrenia, unspecified: Secondary | ICD-10-CM | POA: Diagnosis present

## 2017-03-17 DIAGNOSIS — Z7984 Long term (current) use of oral hypoglycemic drugs: Secondary | ICD-10-CM | POA: Insufficient documentation

## 2017-03-17 LAB — PROTIME-INR
INR: 1.2
PROTHROMBIN TIME: 15.3 s — AB (ref 11.4–15.2)

## 2017-03-17 LAB — CBC WITH DIFFERENTIAL/PLATELET
BASOS ABS: 0.1 10*3/uL (ref 0–0.1)
BASOS PCT: 1 %
Eosinophils Absolute: 0.1 10*3/uL (ref 0–0.7)
Eosinophils Relative: 2 %
HEMATOCRIT: 36.5 % — AB (ref 40.0–52.0)
Hemoglobin: 11.8 g/dL — ABNORMAL LOW (ref 13.0–18.0)
LYMPHS PCT: 13 %
Lymphs Abs: 1.1 10*3/uL (ref 1.0–3.6)
MCH: 27.5 pg (ref 26.0–34.0)
MCHC: 32.4 g/dL (ref 32.0–36.0)
MCV: 84.9 fL (ref 80.0–100.0)
Monocytes Absolute: 0.7 10*3/uL (ref 0.2–1.0)
Monocytes Relative: 8 %
NEUTROS ABS: 6.4 10*3/uL (ref 1.4–6.5)
Neutrophils Relative %: 76 %
Platelets: 133 10*3/uL — ABNORMAL LOW (ref 150–440)
RBC: 4.3 MIL/uL — AB (ref 4.40–5.90)
RDW: 21.1 % — AB (ref 11.5–14.5)
WBC: 8.4 10*3/uL (ref 3.8–10.6)

## 2017-03-17 LAB — COMPREHENSIVE METABOLIC PANEL
ALBUMIN: 3.6 g/dL (ref 3.5–5.0)
ALT: 56 U/L (ref 17–63)
AST: 97 U/L — AB (ref 15–41)
Alkaline Phosphatase: 144 U/L — ABNORMAL HIGH (ref 38–126)
Anion gap: 8 (ref 5–15)
BILIRUBIN TOTAL: 0.8 mg/dL (ref 0.3–1.2)
BUN: 7 mg/dL (ref 6–20)
CHLORIDE: 101 mmol/L (ref 101–111)
CO2: 29 mmol/L (ref 22–32)
CREATININE: 0.97 mg/dL (ref 0.61–1.24)
Calcium: 9 mg/dL (ref 8.9–10.3)
GFR calc Af Amer: >60 mL/min (ref >60)
GLUCOSE: 155 mg/dL — AB (ref 65–99)
Potassium: 4.2 mmol/L (ref 3.5–5.1)
Sodium: 138 mmol/L (ref 135–145)
Total Protein: 8.4 g/dL — ABNORMAL HIGH (ref 6.5–8.1)

## 2017-03-17 LAB — APTT: aPTT: 30 seconds (ref 24–36)

## 2017-03-17 LAB — AMMONIA: AMMONIA: 95 umol/L — AB (ref 9–35)

## 2017-03-17 MED ORDER — LACTULOSE 10 GM/15ML PO SOLN
60.0000 g | Freq: Once | ORAL | Status: AC
  Administered 2017-03-17: 60 g via ORAL
  Filled 2017-03-17: qty 90

## 2017-03-17 NOTE — ED Provider Notes (Signed)
[facility] Emergency Department Provider Note  ____________________________________________   First MD Initiated Contact with Patient 03/17/17 1229     (approximate)  I have reviewed the triage vital signs and the nursing notes.   HISTORY  Chief Complaint "feels like ammonia high"   HPI Howard [name] is a 36 y.o. male who presents to the emergency department stating "I think my ammonia is high". He has a complex past medical history  Including schizophrenia, alcohol abuse, and alcoholic cirrhosis. He lives at home with his mother who is his caretaker. He reports continued alcohol abuse.  He reports noncompliance with his lactulose because he does not like having diarrhea.  He says he wants help stopping drinking. He has had no nausea or vomiting.   Past Medical History:  Diagnosis Date  . Alcoholic cirrhosis of liver with ascites (HCC)   . Alcoholism (HCC)   . Anemia   . COPD (chronic obstructive pulmonary disease) (HCC)   . Depression   . Diabetes mellitus, type II (HCC)   . Esophageal varices (HCC)   . Heart disease    irregular heart beat (palpitations) and heart murmur  . Hyperlipemia   . Hypertension   . Liver disease   . Multiple thyroid nodules   . Portal hypertensive gastropathy (HCC)   . Schizophrenia (HCC)   . Seizures [facility])     Patient Active Problem List   Diagnosis Date Noted  . Iron deficiency anemia due to chronic blood loss 02/16/2017  . Nausea 01/18/2017  . Respiratory failure with hypoxia (HCC) 12/14/2016  . Seizures (HCC) 12/12/2016  . DNR (do not resuscitate) discussion 08/11/2016  . Muscle weakness (generalized)   . GI bleed 08/07/2016  . OSA (obstructive sleep apnea) 04/29/2016  . Anemia 04/29/2016  . Thrombocytopenia (HCC) 04/29/2016  . Coagulopathy (HCC) 04/29/2016  . Obesity 04/29/2016  . Controlled type 2 diabetes mellitus without complication (HCC) 01/15/2016  . Essential (primary) hypertension 12/11/2015  .  Pure hypercholesterolemia 12/11/2015  . Hepatic encephalopathy (HCC) 10/16/2015  . 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 Elevated transaminase level 07/04/2015  . Alcohol abuse 05/31/2015  . Paranoid schizophrenia (HCC) 03/20/2015    Past Surgical History:  Procedure Laterality Date  . ESOPHAGOGASTRODUODENOSCOPY N/A 10/05/2015   Procedure: ESOPHAGOGASTRODUODENOSCOPY (EGD);  Surgeon: Elnita Maxwell, MD;  Location: [facility] ENDOSCOPY;  Service: Endoscopy;  Laterality: N/A;  . ESOPHAGOGASTRODUODENOSCOPY (EGD) WITH PROPOFOL N/A 08/03/2015   Procedure: ESOPHAGOGASTRODUODENOSCOPY (EGD) WITH PROPOFOL;  Surgeon: Elnita Maxwell, MD;  Location: [facility] ENDOSCOPY;  Service: Endoscopy;  Laterality: N/A;  . ESOPHAGOGASTRODUODENOSCOPY (EGD) WITH PROPOFOL N/A 08/31/2015   Procedure: ESOPHAGOGASTRODUODENOSCOPY (EGD) WITH PROPOFOL;  Surgeon: Elnita Maxwell, MD;  Location: [facility] ENDOSCOPY;  Service: Endoscopy;  Laterality: N/A;  . ESOPHAGOGASTRODUODENOSCOPY (EGD) WITH PROPOFOL N/A 04/04/2016   Procedure: ESOPHAGOGASTRODUODENOSCOPY (EGD) WITH PROPOFOL;  Surgeon: Scot Jun, MD;  Location: [facility] ENDOSCOPY;  Service: Endoscopy;  Laterality: N/A;  . ESOPHAGOGASTRODUODENOSCOPY (EGD) WITH PROPOFOL N/A 11/13/2016   Procedure: ESOPHAGOGASTRODUODENOSCOPY (EGD) WITH PROPOFOL;  Surgeon: Wyline Mood, MD;  Location: [facility] ENDOSCOPY;  Service: Endoscopy;  Laterality: N/A;  . NO PAST SURGERIES      Prior to Admission medications   Medication Sig Start Date End Date Taking? Authorizing Provider  acamprosate (CAMPRAL) 333 MG tablet Take 2 tablets (666 mg total) by mouth 3 (three) times daily with meals. 08/19/16   [name], [name], MD  albuterol (PROVENTIL HFA;VENTOLIN HFA) 108 (90 Base) MCG/ACT inhaler Inhale 2 puffs  into the lungs every 6 (six) hours as needed for wheezing or shortness of breath. 10/08/16   Shaune Pollack, MD  citalopram (CELEXA) 20 MG tablet Take 1 tablet (20 mg total) by mouth  every morning. 08/19/16   [name], [name], MD  clonazePAM (KLONOPIN) 0.5 MG tablet Take 1 tablet (0.5 mg total) by mouth 2 (two) times daily as needed for anxiety. 02/04/17   [name], [name], MD  docusate sodium (COLACE) 100 MG capsule Take 100 mg by mouth daily as needed for mild constipation.    [provider]  ferrous sulfate 325 (65 FE) MG tablet Take 3 tablets (975 mg total) by mouth every Monday, Wednesday, and Friday. With breakfast 01/21/17   [name], NP  folic acid (FOLVITE) 1 MG tablet Take 1 tablet (1 mg total) by mouth daily. 12/19/16   Gouru, Deanna Artis, MD  furosemide (LASIX) 20 MG tablet Take 1 tablet by mouth daily. 12/08/16   [provider]  haloperidol (HALDOL) 5 MG tablet Take 1 tablet (5 mg total) by mouth 2 (two) times daily. 01/14/17   [name], [name], MD  lactulose (CHRONULAC) 10 GM/15ML solution Take 45 mLs (30 g total) by mouth every 4 (four) hours. Patient taking differently: Take 30 g by mouth 4 (four) times daily.  07/09/16   Katharina Caper, MD  levETIRAcetam (KEPPRA) 750 MG tablet Take 2 tablets (1,500 mg total) by mouth 2 (two) times daily. 12/11/16 02/09/17  Merrily Brittle, MD  losartan (COZAAR) 25 MG tablet Take 1 tablet (25 mg total) by mouth daily. 03/11/17   [name], NP  lurasidone (LATUDA) 80 MG TABS tablet Take 1 tablet (80 mg total) by mouth daily with supper. 02/26/17   [name], [name], MD  metFORMIN (GLUCOPHAGE) 500 MG tablet Take 1 tablet (500 mg total) by mouth 2 (two) times daily with a meal. 05/15/16   Alford Highland, MD  nadolol (CORGARD) 20 MG tablet Take 1 tablet (20 mg total) by mouth daily. 03/11/17   [name], NP  NON FORMULARY     [provider]  rifaximin (XIFAXAN) 550 MG TABS tablet Take 1 tablet (550 mg total) by mouth 2 (two) times daily. 07/09/16   Katharina Caper, MD  spironolactone (ALDACTONE) 50 MG tablet Take 1 tablet (50 mg total) by mouth 2 (two) times daily. 03/02/17   [name], NP  thiamine (VITAMIN B-1) 100 MG tablet Take 100 mg by mouth daily.    [provider]  traZODone (DESYREL) 100 MG tablet Take 1 tablet (100 mg total) by mouth at bedtime as needed for sleep. 02/26/17   [name], [name], MD    Allergies Patient has no known allergies.  Family History  Problem Relation Age of Onset  . Heart disease Mother   . Hypertension Mother   . Hyperlipidemia Mother   . Stroke Father   . Heart attack Father   . Hypertension Father   . Heart disease Father   . Alcohol abuse Father   . Heart disease Brother     Social History Social History  Substance Use Topics  . Smoking status: Never Smoker  . Smokeless tobacco: Never Used  . Alcohol use 3.6 oz/week    6 Cans of beer per week     Comment: "one quart" 2-3 times per week    Review of Systems Constitutional: No fever/chills Eyes: No visual changes. ENT: No sore throat. Cardiovascular: Denies chest pain. Respiratory: Denies shortness of breath.  Gastrointestinal: No abdominal pain.  No nausea, no vomiting.  No diarrhea.  No constipation. Genitourinary: Negative for dysuria. Musculoskeletal: Negative for back pain. Skin: Negative for rash. Neurological: Negative for headaches, focal weakness or numbness.   ____________________________________________   PHYSICAL EXAM:  VITAL SIGNS: ED Triage Vitals  Enc Vitals Group     BP 03/17/17 1010 (!) 156/84     Pulse Rate 03/17/17 1010 99     Resp 03/17/17 1010 20     Temp 03/17/17 1010 98.1 F (36.7 C)     Temp Source 03/17/17 1010 Oral     SpO2 03/17/17 1010 92 %     Weight 03/17/17 1011 (!) 348 lb (157.9 kg)     Height 03/17/17 1011 6\' 3"  (1.905 m)     Head Circumference --      Peak Flow --      Pain Score --      Pain Loc --      Pain Edu? --      Excl. in GC? --     Constitutional: Alert and oriented 4 speaks in slow methodical words in no distress strange affect Eyes: PERRL EOMI. no icterus Head: Atraumatic. Nose: No  congestion/rhinnorhea. Mouth/Throat: No trismus no tongue fasciculations Neck: No stridor.   Cardiovascular: Normal rate, regular rhythm. Grossly normal heart sounds.  Good peripheral circulation. Respiratory: Normal respiratory effort.  No retractions. Lungs CTAB and moving good air Gastrointestinal: Obese soft nontender Musculoskeletal: Positive asterixis Neurologic:  Normal speech and language. No gross focal neurologic deficits are appreciated. Skin:  Skin is warm, dry and intact. No rash noted. Psychiatric: Strange affect.    ____________________________________________   DIFFERENTIAL includes but not limited to  Hyperammonemia, alcohol abuse, alcohol withdrawal   LABS (all labs ordered are listed, but only abnormal results are displayed)  Labs Reviewed  CBC WITH DIFFERENTIAL/PLATELET - Abnormal; Notable for the following:       Result Value   RBC 4.30 (*)    Hemoglobin 11.8 (*)    HCT 36.5 (*)    RDW 21.1 (*)    Platelets 133 (*)    All other components within normal limits  COMPREHENSIVE METABOLIC PANEL - Abnormal; Notable for the following:    Glucose, Bld 155 (*)    Total Protein 8.4 (*)    AST 97 (*)    Alkaline Phosphatase 144 (*)    All other components within normal limits  AMMONIA - Abnormal; Notable for the following:    Ammonia 95 (*)    All other components within normal limits  PROTIME-INR - Abnormal; Notable for the following:    Prothrombin Time 15.3 (*)    All other components within normal limits  APTT    Elevated ammonia __________________________________________  EKG   ____________________________________________  RADIOLOGY   ____________________________________________   PROCEDURES  Procedure(s) performed: no  Procedures  Critical Care performed: no  Observation: no ____________________________________________   INITIAL IMPRESSION / ASSESSMENT AND PLAN / ED COURSE  Pertinent labs & imaging results that were available  during my care of the patient were reviewed by me and considered in my medical decision making (see chart for details).  The patient arrives hemodynamically stable and at his baseline. He does have mild asterixis and mild elevation in his ammonia. He is talking and able to swallow so it is safe to give him a dose of lactulose orally. I will recheck the TTS and social work because there appears to be a difficult home family situation.  The patient and his mother would like to go home and no longer want to wait for the consults. I think this is reasonable. The patient is able to eat and drink and walk. He verbalizes understanding that he is to continue his lactulose. He is discharged home in improved condition.  ____________________________________________   FINAL CLINICAL IMPRESSION(S) / ED DIAGNOSES  Final diagnoses:  Hyperammonemia (HCC)  Alcoholic cirrhosis, unspecified whether ascites present (HCC)  Other schizophrenia (HCC)      NEW MEDICATIONS STARTED DURING THIS VISIT:  New Prescriptions   No medications on file     Note:  This document was prepared using Dragon voice recognition software and may include unintentional dictation errors.     Merrily Brittle, MD 03/17/17 1429

## 2017-03-17 NOTE — Discharge Instructions (Signed)
Please make sure you take your lactulose at home as prescribed. Follow-up with your primary care provider on Monday for recheck and return to the emergency department sooner for any concerns.  It was a pleasure to take care of you today, and thank you for coming to our emergency department.  If you have any questions or concerns before leaving please ask the nurse to grab me and I'm more than happy to go through your aftercare instructions again.  If you were prescribed any opioid pain medication today such as Norco, Vicodin, Percocet, morphine, hydrocodone, or oxycodone please make sure you do not drive when you are taking this medication as it can alter your ability to drive safely.  If you have any concerns once you are home that you are not improving or are in fact getting worse before you can make it to your follow-up appointment, please do not hesitate to call [phone] and come back for further evaluation.  Merrily BrittleNeil Tashanda Fuhrer MD  Results for orders placed or performed during the hospital encounter of 03/17/17  CBC with Differential  Result Value Ref Range   WBC 8.4 3.8 - 10.6 K/uL   RBC 4.30 (L) 4.40 - 5.90 MIL/uL   Hemoglobin 11.8 (L) 13.0 - 18.0 g/dL   HCT 40.936.5 (L) 81.140.0 - 91.452.0 %   MCV 84.9 80.0 - 100.0 fL   MCH 27.5 26.0 - 34.0 pg   MCHC 32.4 32.0 - 36.0 g/dL   RDW 78.221.1 (H) 95.611.5 - 21.314.5 %   Platelets 133 (L) 150 - 440 K/uL   Neutrophils Relative % 76 %   Neutro Abs 6.4 1.4 - 6.5 K/uL   Lymphocytes Relative 13 %   Lymphs Abs 1.1 1.0 - 3.6 K/uL   Monocytes Relative 8 %   Monocytes Absolute 0.7 0.2 - 1.0 K/uL   Eosinophils Relative 2 %   Eosinophils Absolute 0.1 0 - 0.7 K/uL   Basophils Relative 1 %   Basophils Absolute 0.1 0 - 0.1 K/uL  Comprehensive metabolic panel  Result Value Ref Range   Sodium 138 135 - 145 mmol/L   Potassium 4.2 3.5 - 5.1 mmol/L   Chloride 101 101 - 111 mmol/L   CO2 29 22 - 32 mmol/L   Glucose, Bld 155 (H) 65 - 99 mg/dL   BUN 7 6 - 20 mg/dL   Creatinine, Ser  0.860.97 0.61 - 1.24 mg/dL   Calcium 9.0 8.9 - 57.810.3 mg/dL   Total Protein 8.4 (H) 6.5 - 8.1 g/dL   Albumin 3.6 3.5 - 5.0 g/dL   AST 97 (H) 15 - 41 U/L   ALT 56 17 - 63 U/L   Alkaline Phosphatase 144 (H) 38 - 126 U/L   Total Bilirubin 0.8 0.3 - 1.2 mg/dL   GFR calc non Af Amer >60 >60 mL/min   GFR calc Af Amer >60 >60 mL/min   Anion gap 8 5 - 15  Ammonia  Result Value Ref Range   Ammonia 95 (H) 9 - 35 umol/L  Protime-INR  Result Value Ref Range   Prothrombin Time 15.3 (H) 11.4 - 15.2 seconds   INR 1.20   APTT  Result Value Ref Range   aPTT 30 24 - 36 seconds

## 2017-03-17 NOTE — ED Triage Notes (Signed)
Pt here for "i feel like my ammonia is high".  Is a liver pt, has not been taking lactulose.  Ambulatory to triage without difficulty.  Alert and oriented X 4.  icterus noted.  Does have intermittent nose bleeds at times.  Mom reports it is random and just starts for any reason. No current bleeding.  No blood in stools.  Respirations unlabored.

## 2017-03-22 NOTE — Progress Notes (Signed)
[facility]  Telephone:([phone]) [phone] Fax:([phone]) [phone]  ID: Howard [name] OB: [birth year]  MR#: [id number]  AOZ#:[id number]  Patient Care Team: [name], NP as PCP - General (Nurse Practitioner)  CHIEF COMPLAINT: Iron deficiency anemia  INTERVAL HISTORY: Patient is a 36 year old male with multiple medical problems who was noted to have a declining hemoglobin and iron deficiency likely secondary to variceal bleeding from cirrhosis. Patient is a poor historian, but denies any complaints today. He has no neurologic points. He denies any recent fevers or illnesses. He has a good appetite and his weight has remained stable. He has no chest pain or shortness of breath. He denies any recent bleeding. He has no nausea, vomiting, constipation, or diarrhea. He denies any melena or hematochezia. He has no urinary complaints. Patient offers no specific complaints today.  REVIEW OF SYSTEMS:   Review of Systems  Constitutional: Negative.  Negative for fever, malaise/fatigue and weight loss.  Respiratory: Positive for shortness of breath. Negative for cough.   Cardiovascular: Negative.  Negative for chest pain and leg swelling.  Gastrointestinal: Negative.  Negative for abdominal pain, blood in stool and melena.  Genitourinary: Negative.   Musculoskeletal: Negative.   Skin: Negative.  Negative for rash.  Neurological: Negative.  Negative for sensory change and weakness.  Psychiatric/Behavioral: Positive for substance abuse. The patient is not nervous/anxious.     As per HPI. Otherwise, a complete review of systems is negative.  PAST MEDICAL HISTORY: Past Medical History:  Diagnosis Date  . Alcoholic cirrhosis of liver with ascites (HCC)   . Alcoholism (HCC)   . Anemia   . COPD (chronic obstructive pulmonary disease) (HCC)   . Depression   . Diabetes mellitus, type II (HCC)   . Esophageal varices (HCC)   . Heart disease    irregular heart beat (palpitations) and  heart murmur  . Hyperlipemia   . Hypertension   . Liver disease   . Multiple thyroid nodules   . Portal hypertensive gastropathy (HCC)   . Schizophrenia (HCC)   . Seizures (HCC)     PAST SURGICAL HISTORY: Past Surgical History:  Procedure Laterality Date  . ESOPHAGOGASTRODUODENOSCOPY N/A 10/05/2015   Procedure: ESOPHAGOGASTRODUODENOSCOPY (EGD);  Surgeon: Elnita Maxwell, MD;  Location: [facility] ENDOSCOPY;  Service: Endoscopy;  Laterality: N/A;  . ESOPHAGOGASTRODUODENOSCOPY (EGD) WITH PROPOFOL N/A 08/03/2015   Procedure: ESOPHAGOGASTRODUODENOSCOPY (EGD) WITH PROPOFOL;  Surgeon: Elnita Maxwell, MD;  Location: [facility] ENDOSCOPY;  Service: Endoscopy;  Laterality: N/A;  . ESOPHAGOGASTRODUODENOSCOPY (EGD) WITH PROPOFOL N/A 08/31/2015   Procedure: ESOPHAGOGASTRODUODENOSCOPY (EGD) WITH PROPOFOL;  Surgeon: Elnita Maxwell, MD;  Location: [facility] ENDOSCOPY;  Service: Endoscopy;  Laterality: N/A;  . ESOPHAGOGASTRODUODENOSCOPY (EGD) WITH PROPOFOL N/A 04/04/2016   Procedure: ESOPHAGOGASTRODUODENOSCOPY (EGD) WITH PROPOFOL;  Surgeon: Scot Jun, MD;  Location: [facility] ENDOSCOPY;  Service: Endoscopy;  Laterality: N/A;  . ESOPHAGOGASTRODUODENOSCOPY (EGD) WITH PROPOFOL N/A 11/13/2016   Procedure: ESOPHAGOGASTRODUODENOSCOPY (EGD) WITH PROPOFOL;  Surgeon: Wyline Mood, MD;  Location: [facility] ENDOSCOPY;  Service: Endoscopy;  Laterality: N/A;  . NO PAST SURGERIES      FAMILY HISTORY: Family History  Problem Relation Age of Onset  . Heart disease Mother   . Hypertension Mother   . Hyperlipidemia Mother   . Stroke Father   . Heart attack Father   . Hypertension Father   . Heart disease Father   . Alcohol abuse Father   . Heart disease Brother     ADVANCED DIRECTIVES (Y/N):  N  HEALTH MAINTENANCE: Social  History  Substance Use Topics  . Smoking status: Never Smoker  . Smokeless tobacco: Never Used  . Alcohol use 3.6 oz/week    6 Cans of beer per week     Comment: "one quart" 2-3 times per week       Colonoscopy:  PAP:  Bone density:  Lipid panel:  No Known Allergies  Current Outpatient Prescriptions  Medication Sig Dispense Refill  . acamprosate (CAMPRAL) 333 MG tablet Take 2 tablets (666 mg total) by mouth 3 (three) times daily with meals. 180 tablet 5  . albuterol (PROVENTIL HFA;VENTOLIN HFA) 108 (90 Base) MCG/ACT inhaler Inhale 2 puffs into the lungs every 6 (six) hours as needed for wheezing or shortness of breath. 1 Inhaler 2  . citalopram (CELEXA) 20 MG tablet Take 1 tablet (20 mg total) by mouth every morning. 30 tablet 5  . clonazePAM (KLONOPIN) 0.5 MG tablet Take 1 tablet (0.5 mg total) by mouth 2 (two) times daily as needed for anxiety. 60 tablet 0  . docusate sodium (COLACE) 100 MG capsule Take 100 mg by mouth daily as needed for mild constipation.    . ferrous sulfate 325 (65 FE) MG tablet Take 3 tablets (975 mg total) by mouth every Monday, Wednesday, and Friday. With breakfast 36 tablet 5  . folic acid (FOLVITE) 1 MG tablet Take 1 tablet (1 mg total) by mouth daily. 30 tablet 0  . furosemide (LASIX) 20 MG tablet Take 1 tablet by mouth daily.    . haloperidol (HALDOL) 5 MG tablet Take 1 tablet (5 mg total) by mouth 2 (two) times daily. 60 tablet 1  . lactulose (CHRONULAC) 10 GM/15ML solution Take 45 mLs (30 g total) by mouth every 4 (four) hours. (Patient taking differently: Take 30 g by mouth 4 (four) times daily. ) 240 mL 0  . losartan (COZAAR) 25 MG tablet Take 1 tablet (25 mg total) by mouth daily. 30 tablet 2  . lurasidone (LATUDA) 80 MG TABS tablet Take 1 tablet (80 mg total) by mouth daily with supper. 30 tablet 0  . metFORMIN (GLUCOPHAGE) 500 MG tablet Take 1 tablet (500 mg total) by mouth 2 (two) times daily with a meal. 60 tablet 0  . nadolol (CORGARD) 20 MG tablet Take 1 tablet (20 mg total) by mouth daily. 30 tablet 2  . NON FORMULARY     . rifaximin (XIFAXAN) 550 MG TABS tablet Take 1 tablet (550 mg total) by mouth 2 (two) times daily. 60 tablet 5  .  spironolactone (ALDACTONE) 50 MG tablet Take 1 tablet (50 mg total) by mouth 2 (two) times daily. 60 tablet 5  . thiamine (VITAMIN B-1) 100 MG tablet Take 100 mg by mouth daily.    . traZODone (DESYREL) 100 MG tablet Take 1 tablet (100 mg total) by mouth at bedtime as needed for sleep. 30 tablet 2  . levETIRAcetam (KEPPRA) 750 MG tablet Take 2 tablets (1,500 mg total) by mouth 2 (two) times daily. 120 tablet 0   Current Facility-Administered Medications  Medication Dose Route Frequency Provider Last Rate Last Dose  . lurasidone (LATUDA) tablet 40 mg  40 mg Oral Q supper [name], [name], MD        OBJECTIVE: Vitals:   03/23/17 1428  BP: 137/86  Pulse: 76  Resp: 20  Temp: (!) 96.9 F (36.1 C)     Body mass index is 43.37 kg/m.    ECOG FS:1 - Symptomatic but completely ambulatory  General: Well-developed, well-nourished, no acute  distress. Eyes: Pink conjunctiva, anicteric sclera. HEENT: Normocephalic, moist mucous membranes, clear oropharnyx. Lungs: Clear to auscultation bilaterally. Heart: Regular rate and rhythm. No rubs, murmurs, or gallops. Abdomen: Soft, nontender, nondistended. No organomegaly noted, normoactive bowel sounds. Musculoskeletal: No edema, cyanosis, or clubbing. Neuro: Alert, answering all questions appropriately. Cranial nerves grossly intact. Skin: No rashes or petechiae noted. Psych: Normal affect. Lymphatics: No cervical, calvicular, axillary or inguinal LAD.   LAB RESULTS:  Lab Results  Component Value Date   NA 138 03/17/2017   K 4.2 03/17/2017   CL 101 03/17/2017   CO2 29 03/17/2017   GLUCOSE 155 (H) 03/17/2017   BUN 7 03/17/2017   CREATININE 0.97 03/17/2017   CALCIUM 9.0 03/17/2017   PROT 8.4 (H) 03/17/2017   ALBUMIN 3.6 03/17/2017   AST 97 (H) 03/17/2017   ALT 56 03/17/2017   ALKPHOS 144 (H) 03/17/2017   BILITOT 0.8 03/17/2017   GFRNONAA >60 03/17/2017   GFRAA >60 03/17/2017    Lab Results  Component Value Date   WBC 7.5 03/23/2017     NEUTROABS 6.4 03/17/2017   HGB 11.7 (L) 03/23/2017   HCT 35.6 (L) 03/23/2017   MCV 85.5 03/23/2017   PLT 128 (L) 03/23/2017   Lab Results  Component Value Date   IRON 41 (L) 03/23/2017   TIBC 424 03/23/2017   IRONPCTSAT 10 (L) 03/23/2017   Lab Results  Component Value Date   FERRITIN 37 03/23/2017     STUDIES: No results found.  ASSESSMENT: Iron deficiency anemia  PLAN:    1. Iron deficiency anemia: Patient's hemoglobin is nearly within normal limits. He only has mildly decreased stores. The remainder of his anemia workup is either negative or within normal limits. He does not require IV iron at this time, but given his history of variceal bleeds he may need one in the future. No intervention is needed. Return to clinic in 3 months with repeat laboratory work and further evaluation. 2. Thrombocytopenia: Likely secondary to cirrhosis. Mild, monitor. 3. Cirrhosis: AFP is pending.  Approximately 45 minutes was spent in discussion of which greater than 50% was consultation.  Patient expressed understanding and was in agreement with this plan. He also understands that He can call clinic at any time with any questions, concerns, or complaints.    Jeralyn Ruthsimothy J Finnegan, MD   03/23/2017 5:20 PM

## 2017-03-23 ENCOUNTER — Inpatient Hospital Stay: Payer: Medicare Other

## 2017-03-23 ENCOUNTER — Inpatient Hospital Stay: Payer: Medicare Other | Attending: Oncology | Admitting: Oncology

## 2017-03-23 VITALS — BP 137/86 | HR 76 | Temp 96.9°F | Resp 20 | Wt 347.0 lb

## 2017-03-23 DIAGNOSIS — R569 Unspecified convulsions: Secondary | ICD-10-CM | POA: Insufficient documentation

## 2017-03-23 DIAGNOSIS — Z79899 Other long term (current) drug therapy: Secondary | ICD-10-CM | POA: Insufficient documentation

## 2017-03-23 DIAGNOSIS — D509 Iron deficiency anemia, unspecified: Secondary | ICD-10-CM | POA: Insufficient documentation

## 2017-03-23 DIAGNOSIS — Z7984 Long term (current) use of oral hypoglycemic drugs: Secondary | ICD-10-CM | POA: Insufficient documentation

## 2017-03-23 DIAGNOSIS — F209 Schizophrenia, unspecified: Secondary | ICD-10-CM | POA: Diagnosis not present

## 2017-03-23 DIAGNOSIS — F1020 Alcohol dependence, uncomplicated: Secondary | ICD-10-CM | POA: Insufficient documentation

## 2017-03-23 DIAGNOSIS — E785 Hyperlipidemia, unspecified: Secondary | ICD-10-CM | POA: Diagnosis not present

## 2017-03-23 DIAGNOSIS — I10 Essential (primary) hypertension: Secondary | ICD-10-CM | POA: Insufficient documentation

## 2017-03-23 DIAGNOSIS — E119 Type 2 diabetes mellitus without complications: Secondary | ICD-10-CM

## 2017-03-23 DIAGNOSIS — D500 Iron deficiency anemia secondary to blood loss (chronic): Secondary | ICD-10-CM

## 2017-03-23 DIAGNOSIS — I8500 Esophageal varices without bleeding: Secondary | ICD-10-CM | POA: Insufficient documentation

## 2017-03-23 DIAGNOSIS — F329 Major depressive disorder, single episode, unspecified: Secondary | ICD-10-CM

## 2017-03-23 DIAGNOSIS — D696 Thrombocytopenia, unspecified: Secondary | ICD-10-CM | POA: Diagnosis not present

## 2017-03-23 DIAGNOSIS — R011 Cardiac murmur, unspecified: Secondary | ICD-10-CM | POA: Diagnosis not present

## 2017-03-23 DIAGNOSIS — K766 Portal hypertension: Secondary | ICD-10-CM | POA: Diagnosis not present

## 2017-03-23 DIAGNOSIS — K7460 Unspecified cirrhosis of liver: Secondary | ICD-10-CM | POA: Insufficient documentation

## 2017-03-23 DIAGNOSIS — K3189 Other diseases of stomach and duodenum: Secondary | ICD-10-CM | POA: Insufficient documentation

## 2017-03-23 DIAGNOSIS — I499 Cardiac arrhythmia, unspecified: Secondary | ICD-10-CM | POA: Diagnosis not present

## 2017-03-23 DIAGNOSIS — J449 Chronic obstructive pulmonary disease, unspecified: Secondary | ICD-10-CM

## 2017-03-23 LAB — CBC
HCT: 35.6 % — ABNORMAL LOW (ref 40.0–52.0)
Hemoglobin: 11.7 g/dL — ABNORMAL LOW (ref 13.0–18.0)
MCH: 28 pg (ref 26.0–34.0)
MCHC: 32.8 g/dL (ref 32.0–36.0)
MCV: 85.5 fL (ref 80.0–100.0)
PLATELETS: 128 10*3/uL — AB (ref 150–440)
RBC: 4.16 MIL/uL — ABNORMAL LOW (ref 4.40–5.90)
RDW: 21.1 % — AB (ref 11.5–14.5)
WBC: 7.5 10*3/uL (ref 3.8–10.6)

## 2017-03-23 LAB — LACTATE DEHYDROGENASE: LDH: 180 U/L (ref 98–192)

## 2017-03-23 LAB — VITAMIN B12: Vitamin B-12: 526 pg/mL (ref 180–914)

## 2017-03-23 LAB — IRON AND TIBC
IRON: 41 ug/dL — AB (ref 45–182)
Saturation Ratios: 10 % — ABNORMAL LOW (ref 17.9–39.5)
TIBC: 424 ug/dL (ref 250–450)
UIBC: 383 ug/dL

## 2017-03-23 LAB — DAT, POLYSPECIFIC AHG (ARMC ONLY): Polyspecific AHG test: NEGATIVE

## 2017-03-23 LAB — FERRITIN: Ferritin: 37 ng/mL (ref 24–336)

## 2017-03-23 LAB — FOLATE: FOLATE: 15.3 ng/mL (ref >5.9)

## 2017-03-23 NOTE — Progress Notes (Signed)
Patient here today for new evaluation, denies any concerns today.

## 2017-03-24 ENCOUNTER — Ambulatory Visit (INDEPENDENT_AMBULATORY_CARE_PROVIDER_SITE_OTHER): Payer: Medicare Other | Admitting: Psychiatry

## 2017-03-24 ENCOUNTER — Encounter: Payer: Self-pay | Admitting: Psychiatry

## 2017-03-24 ENCOUNTER — Telehealth: Payer: Self-pay

## 2017-03-24 ENCOUNTER — Other Ambulatory Visit: Payer: Self-pay | Admitting: Psychiatry

## 2017-03-24 VITALS — BP 136/79 | HR 87 | Temp 99.0°F | Wt 345.8 lb

## 2017-03-24 DIAGNOSIS — F200 Paranoid schizophrenia: Secondary | ICD-10-CM

## 2017-03-24 DIAGNOSIS — F1010 Alcohol abuse, uncomplicated: Secondary | ICD-10-CM

## 2017-03-24 LAB — HAPTOGLOBIN: Haptoglobin: 127 mg/dL (ref 34–200)

## 2017-03-24 LAB — AFP TUMOR MARKER: AFP TUMOR MARKER: 2 ng/mL (ref 0.0–8.3)

## 2017-03-24 MED ORDER — TRAZODONE HCL 100 MG PO TABS: 100.0000 mg | ORAL_TABLET | Freq: Every evening | ORAL | 5 refills | Status: DC | PRN

## 2017-03-24 MED ORDER — HALOPERIDOL 5 MG PO TABS: 5.0000 mg | ORAL_TABLET | Freq: Two times a day (BID) | ORAL | 5 refills | Status: DC

## 2017-03-24 MED ORDER — ACAMPROSATE CALCIUM 333 MG PO TBEC: 666.0000 mg | DELAYED_RELEASE_TABLET | Freq: Three times a day (TID) | ORAL | 5 refills | Status: DC

## 2017-03-24 MED ORDER — CLONAZEPAM 0.5 MG PO TABS: 0.5000 mg | ORAL_TABLET | Freq: Two times a day (BID) | ORAL | 5 refills | Status: DC | PRN

## 2017-03-24 MED ORDER — CITALOPRAM HYDROBROMIDE 20 MG PO TABS: 20.0000 mg | ORAL_TABLET | ORAL | 5 refills | Status: DC

## 2017-03-24 MED ORDER — LURASIDONE HCL 80 MG PO TABS: 80.0000 mg | ORAL_TABLET | Freq: Every day | ORAL | 5 refills | Status: DC

## 2017-03-24 NOTE — Telephone Encounter (Signed)
[facility] ask if pt was going back on haldol and if he is suppose to be on both haldol and latuda.  She states that they have a not saying that haldol was stopped in 5-26th so she just needs to confirm that he is starting back on haldol.      Disp Refills Start End   haloperidol (HALDOL) 5 MG tablet 60 tablet 5 03/24/2017    Sig - Route: Take 1 tablet (5 mg total) by mouth 2 (two) times daily. - Oral   E-Prescribing Status: Receipt confirmed by pharmacy (03/24/2017 2:24 PM EDT)
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
## 2017-03-24 NOTE — Telephone Encounter (Signed)
Know. My mistake. He is not to restart the Haldol. I will try to correct that order.

## 2017-03-25 ENCOUNTER — Observation Stay
Admission: EM | Admit: 2017-03-25 | Discharge: 2017-03-27 | Disposition: A | Discharge status: Home / Self Care | Payer: Medicare Other | Attending: Internal Medicine | Admitting: Internal Medicine

## 2017-03-25 ENCOUNTER — Emergency Department
Admission: EM | Admit: 2017-03-25 | Discharge: 2017-03-25 | Disposition: A | Discharge status: Home / Self Care | Payer: Medicare Other | Source: Home / Self Care | Attending: Emergency Medicine | Admitting: Emergency Medicine

## 2017-03-25 ENCOUNTER — Encounter: Payer: Self-pay | Admitting: *Deleted

## 2017-03-25 ENCOUNTER — Encounter: Payer: Self-pay | Admitting: Emergency Medicine

## 2017-03-25 ENCOUNTER — Emergency Department: Payer: Medicare Other

## 2017-03-25 DIAGNOSIS — D6959 Other secondary thrombocytopenia: Secondary | ICD-10-CM | POA: Insufficient documentation

## 2017-03-25 DIAGNOSIS — F329 Major depressive disorder, single episode, unspecified: Secondary | ICD-10-CM | POA: Insufficient documentation

## 2017-03-25 DIAGNOSIS — E119 Type 2 diabetes mellitus without complications: Secondary | ICD-10-CM | POA: Insufficient documentation

## 2017-03-25 DIAGNOSIS — R319 Hematuria, unspecified: Secondary | ICD-10-CM

## 2017-03-25 DIAGNOSIS — R51 Headache: Secondary | ICD-10-CM | POA: Insufficient documentation

## 2017-03-25 DIAGNOSIS — R1012 Left upper quadrant pain: Secondary | ICD-10-CM | POA: Insufficient documentation

## 2017-03-25 DIAGNOSIS — I10 Essential (primary) hypertension: Secondary | ICD-10-CM | POA: Insufficient documentation

## 2017-03-25 DIAGNOSIS — E669 Obesity, unspecified: Secondary | ICD-10-CM | POA: Insufficient documentation

## 2017-03-25 DIAGNOSIS — Z79899 Other long term (current) drug therapy: Secondary | ICD-10-CM | POA: Insufficient documentation

## 2017-03-25 DIAGNOSIS — R569 Unspecified convulsions: Secondary | ICD-10-CM | POA: Insufficient documentation

## 2017-03-25 DIAGNOSIS — D649 Anemia, unspecified: Secondary | ICD-10-CM | POA: Insufficient documentation

## 2017-03-25 DIAGNOSIS — Z7984 Long term (current) use of oral hypoglycemic drugs: Secondary | ICD-10-CM

## 2017-03-25 DIAGNOSIS — K920 Hematemesis: Secondary | ICD-10-CM

## 2017-03-25 DIAGNOSIS — K7031 Alcoholic cirrhosis of liver with ascites: Secondary | ICD-10-CM | POA: Diagnosis not present

## 2017-03-25 DIAGNOSIS — K3189 Other diseases of stomach and duodenum: Secondary | ICD-10-CM | POA: Diagnosis not present

## 2017-03-25 DIAGNOSIS — F200 Paranoid schizophrenia: Secondary | ICD-10-CM | POA: Insufficient documentation

## 2017-03-25 DIAGNOSIS — Z8719 Personal history of other diseases of the digestive system: Secondary | ICD-10-CM

## 2017-03-25 DIAGNOSIS — G4733 Obstructive sleep apnea (adult) (pediatric): Secondary | ICD-10-CM | POA: Insufficient documentation

## 2017-03-25 DIAGNOSIS — F1010 Alcohol abuse, uncomplicated: Secondary | ICD-10-CM | POA: Diagnosis not present

## 2017-03-25 DIAGNOSIS — J449 Chronic obstructive pulmonary disease, unspecified: Secondary | ICD-10-CM | POA: Insufficient documentation

## 2017-03-25 DIAGNOSIS — K766 Portal hypertension: Secondary | ICD-10-CM | POA: Diagnosis not present

## 2017-03-25 DIAGNOSIS — R52 Pain, unspecified: Secondary | ICD-10-CM

## 2017-03-25 DIAGNOSIS — Z6841 Body Mass Index (BMI) 40.0 and over, adult: Secondary | ICD-10-CM | POA: Insufficient documentation

## 2017-03-25 DIAGNOSIS — K922 Gastrointestinal hemorrhage, unspecified: Secondary | ICD-10-CM

## 2017-03-25 LAB — BASIC METABOLIC PANEL
Anion gap: 8 (ref 5–15)
BUN: 14 mg/dL (ref 6–20)
CALCIUM: 8.8 mg/dL — AB (ref 8.9–10.3)
CO2: 32 mmol/L (ref 22–32)
CREATININE: 1.51 mg/dL — AB (ref 0.61–1.24)
Chloride: 98 mmol/L — ABNORMAL LOW (ref 101–111)
GFR calc non Af Amer: 57 mL/min — ABNORMAL LOW (ref >60)
Glucose, Bld: 91 mg/dL (ref 65–99)
Potassium: 3.8 mmol/L (ref 3.5–5.1)
SODIUM: 138 mmol/L (ref 135–145)

## 2017-03-25 LAB — URINALYSIS, COMPLETE (UACMP) WITH MICROSCOPIC
BILIRUBIN URINE: NEGATIVE
Bacteria, UA: NONE SEEN
GLUCOSE, UA: NEGATIVE mg/dL
KETONES UR: 5 mg/dL — AB
LEUKOCYTES UA: NEGATIVE
NITRITE: NEGATIVE
PH: 6 (ref 5.0–8.0)
Protein, ur: 100 mg/dL — AB
SPECIFIC GRAVITY, URINE: 1.027 (ref 1.005–1.030)

## 2017-03-25 LAB — CBC
HCT: 36.2 % — ABNORMAL LOW (ref 40.0–52.0)
HEMOGLOBIN: 12.1 g/dL — AB (ref 13.0–18.0)
MCH: 28.8 pg (ref 26.0–34.0)
MCHC: 33.5 g/dL (ref 32.0–36.0)
MCV: 86 fL (ref 80.0–100.0)
PLATELETS: 119 10*3/uL — AB (ref 150–440)
RBC: 4.21 MIL/uL — AB (ref 4.40–5.90)
RDW: 21.6 % — ABNORMAL HIGH (ref 11.5–14.5)
WBC: 8.2 10*3/uL (ref 3.8–10.6)

## 2017-03-25 LAB — COMPREHENSIVE METABOLIC PANEL
ALK PHOS: 142 U/L — AB (ref 38–126)
ALT: 50 U/L (ref 17–63)
ANION GAP: 8 (ref 5–15)
AST: 91 U/L — ABNORMAL HIGH (ref 15–41)
Albumin: 3.7 g/dL (ref 3.5–5.0)
BUN: 14 mg/dL (ref 6–20)
CALCIUM: 9.1 mg/dL (ref 8.9–10.3)
CO2: 31 mmol/L (ref 22–32)
CREATININE: 1.64 mg/dL — AB (ref 0.61–1.24)
Chloride: 99 mmol/L — ABNORMAL LOW (ref 101–111)
GFR, EST AFRICAN AMERICAN: 60 mL/min — AB (ref >60)
GFR, EST NON AFRICAN AMERICAN: 52 mL/min — AB (ref >60)
Glucose, Bld: 128 mg/dL — ABNORMAL HIGH (ref 65–99)
Potassium: 4 mmol/L (ref 3.5–5.1)
SODIUM: 138 mmol/L (ref 135–145)
TOTAL PROTEIN: 8.5 g/dL — AB (ref 6.5–8.1)
Total Bilirubin: 1.1 mg/dL (ref 0.3–1.2)

## 2017-03-25 LAB — PROTIME-INR
INR: 1.27
PROTHROMBIN TIME: 16 s — AB (ref 11.4–15.2)

## 2017-03-25 LAB — CK: Total CK: 146 U/L (ref 49–397)

## 2017-03-25 LAB — APTT: APTT: 29 s (ref 24–36)

## 2017-03-25 LAB — LIPASE, BLOOD: Lipase: 37 U/L (ref 11–51)

## 2017-03-25 MED ORDER — SODIUM CHLORIDE 0.9 % IV SOLN
Freq: Once | INTRAVENOUS | Status: AC
  Administered 2017-03-25: 20:00:00 via INTRAVENOUS

## 2017-03-25 MED ORDER — ACETAMINOPHEN 500 MG PO TABS
1000.0000 mg | ORAL_TABLET | Freq: Once | ORAL | Status: AC
  Administered 2017-03-25: 1000 mg via ORAL
  Filled 2017-03-25: qty 2

## 2017-03-25 NOTE — ED Notes (Signed)
Sarah from Lab to add on BMP to green top sent down at 20:38pm

## 2017-03-25 NOTE — ED Triage Notes (Signed)
Pt reports having blood in urine and blood in vomit since yesterday. Pt reports having had two episodes of bloody vomit (one yesterday and one today) as well as bright red blood in urine and dysuria since yesterday. No blood reported in stool and no diarrhea. Pt report having groin and right lower abd pain as well. No fevers rpeorted.  Pt reports he drinks beer every other day and has about 4 beers each time he drinks.

## 2017-03-25 NOTE — Discharge Instructions (Signed)
Please return at once if you vomit up any more blood. Please follow-up with Dr. Mena GoesEskridge the urologist. Call his office in the morning to arrange an appointment. They will evaluate U for the blood in the urine. Please follow-up with your regular doctor as well. Call her in the morning also. Also return for fever or belly pain unable to keep down fluids or feeling sicker at all.

## 2017-03-25 NOTE — ED Provider Notes (Signed)
[facility] Emergency Department Provider Note   ____________________________________________   First MD Initiated Contact with Patient 03/25/17 1727     (approximate)  I have reviewed the triage vital signs and the nursing notes.   HISTORY  Chief Complaint Hematuria and Hematemesis    HPI Howard [name] is a 36 y.o. male who reportedly vomited very hard earlier today and had some streaks of blood in the vomit and afterwards began having red urine. He also had pain in the left upper quadrant of his abdomen this is improved but still present. The pain in his abdomen is not made worse with movement or by palpation but it is there. Again he only reported some small streaks of blood in the vomit is not had any further vomiting and not in any further production of blood from his mouth. At present patient feels fairly well except for some pain in the left upper quadrant of his abdomen.   Past Medical History:  Diagnosis Date  . Alcoholic cirrhosis of liver with ascites (HCC)   . Alcoholism (HCC)   . Anemia   . COPD (chronic obstructive pulmonary disease) (HCC)   . Depression   . Diabetes mellitus, type II (HCC)   . Esophageal varices (HCC)   . Heart disease    irregular heart beat (palpitations) and heart murmur  . Hyperlipemia   . Hypertension   . Liver disease   . Multiple thyroid nodules   . Portal hypertensive gastropathy (HCC)   . Schizophrenia (HCC)   . Seizures [facility])     Patient Active Problem List   Diagnosis Date Noted  . Iron deficiency anemia due to chronic blood loss 02/16/2017  . Nausea 01/18/2017  . Respiratory failure with hypoxia (HCC) 12/14/2016  . Seizures (HCC) 12/12/2016  . DNR (do not resuscitate) discussion 08/11/2016  . Muscle weakness (generalized)   . GI bleed 08/07/2016  . OSA (obstructive sleep apnea) 04/29/2016  . Anemia 04/29/2016  . Thrombocytopenia (HCC) 04/29/2016  . Coagulopathy (HCC) 04/29/2016  . Obesity  04/29/2016  . Controlled type 2 diabetes mellitus without complication (HCC) 01/15/2016  . Essential (primary) hypertension 12/11/2015  . Pure hypercholesterolemia 12/11/2015  . Hepatic encephalopathy (HCC) 10/16/2015  . 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 Elevated transaminase level 07/04/2015  . Alcohol abuse 05/31/2015  . Paranoid schizophrenia (HCC) 03/20/2015    Past Surgical History:  Procedure Laterality Date  . ESOPHAGOGASTRODUODENOSCOPY N/A 10/05/2015   Procedure: ESOPHAGOGASTRODUODENOSCOPY (EGD);  Surgeon: Elnita Maxwell, MD;  Location: [facility] ENDOSCOPY;  Service: Endoscopy;  Laterality: N/A;  . ESOPHAGOGASTRODUODENOSCOPY (EGD) WITH PROPOFOL N/A 08/03/2015   Procedure: ESOPHAGOGASTRODUODENOSCOPY (EGD) WITH PROPOFOL;  Surgeon: Elnita Maxwell, MD;  Location: [facility] ENDOSCOPY;  Service: Endoscopy;  Laterality: N/A;  . ESOPHAGOGASTRODUODENOSCOPY (EGD) WITH PROPOFOL N/A 08/31/2015   Procedure: ESOPHAGOGASTRODUODENOSCOPY (EGD) WITH PROPOFOL;  Surgeon: Elnita Maxwell, MD;  Location: [facility] ENDOSCOPY;  Service: Endoscopy;  Laterality: N/A;  . ESOPHAGOGASTRODUODENOSCOPY (EGD) WITH PROPOFOL N/A 04/04/2016   Procedure: ESOPHAGOGASTRODUODENOSCOPY (EGD) WITH PROPOFOL;  Surgeon: Scot Jun, MD;  Location: [facility] ENDOSCOPY;  Service: Endoscopy;  Laterality: N/A;  . ESOPHAGOGASTRODUODENOSCOPY (EGD) WITH PROPOFOL N/A 11/13/2016   Procedure: ESOPHAGOGASTRODUODENOSCOPY (EGD) WITH PROPOFOL;  Surgeon: Wyline Mood, MD;  Location: [facility] ENDOSCOPY;  Service: Endoscopy;  Laterality: N/A;  . NO PAST SURGERIES      Prior to Admission medications   Medication Sig Start Date End Date Taking? Authorizing Provider  acamprosate (CAMPRAL) 333 MG tablet Take  2 tablets (666 mg total) by mouth 3 (three) times daily with meals. 03/24/17   [name], [name], MD  albuterol (PROVENTIL HFA;VENTOLIN HFA) 108 (90 Base) MCG/ACT inhaler Inhale 2 puffs into the lungs every 6 (six) hours as  needed for wheezing or shortness of breath. 10/08/16   Shaune Pollack, MD  citalopram (CELEXA) 20 MG tablet Take 1 tablet (20 mg total) by mouth every morning. 03/24/17   [name], [name], MD  clonazePAM (KLONOPIN) 0.5 MG tablet Take 1 tablet (0.5 mg total) by mouth 2 (two) times daily as needed for anxiety. 03/24/17   [name], [name], MD  docusate sodium (COLACE) 100 MG capsule Take 100 mg by mouth daily as needed for mild constipation.    [provider]  ferrous sulfate 325 (65 FE) MG tablet Take 3 tablets (975 mg total) by mouth every Monday, Wednesday, and Friday. With breakfast 01/21/17   [name], NP  folic acid (FOLVITE) 1 MG tablet Take 1 tablet (1 mg total) by mouth daily. 12/19/16   Gouru, Deanna Artis, MD  furosemide (LASIX) 20 MG tablet Take 1 tablet by mouth daily. 12/08/16   [provider]  lactulose (CHRONULAC) 10 GM/15ML solution Take 45 mLs (30 g total) by mouth every 4 (four) hours. Patient taking differently: Take 30 g by mouth 4 (four) times daily.  07/09/16   Katharina Caper, MD  levETIRAcetam (KEPPRA) 750 MG tablet Take 2 tablets (1,500 mg total) by mouth 2 (two) times daily. 12/11/16 02/09/17  Merrily Brittle, MD  losartan (COZAAR) 25 MG tablet Take 1 tablet (25 mg total) by mouth daily. 03/11/17   [name], NP  lurasidone (LATUDA) 80 MG TABS tablet Take 1 tablet (80 mg total) by mouth daily with supper. 03/24/17   [name], [name], MD  metFORMIN (GLUCOPHAGE) 500 MG tablet Take 1 tablet (500 mg total) by mouth 2 (two) times daily with a meal. 05/15/16   Alford Highland, MD  nadolol (CORGARD) 20 MG tablet Take 1 tablet (20 mg total) by mouth daily. 03/11/17   [name], NP  NON FORMULARY     [provider]  rifaximin (XIFAXAN) 550 MG TABS tablet Take 1 tablet (550 mg total) by mouth 2 (two) times daily. 07/09/16   Katharina Caper, MD  spironolactone (ALDACTONE) 50 MG tablet Take 1 tablet (50 mg total) by mouth 2 (two) times daily. 03/02/17    [name], NP  thiamine (VITAMIN B-1) 100 MG tablet Take 100 mg by mouth daily.    [provider]  traZODone (DESYREL) 100 MG tablet Take 1 tablet (100 mg total) by mouth at bedtime as needed for sleep. 03/24/17   [name], [name], MD    Allergies Patient has no known allergies.  Family History  Problem Relation Age of Onset  . Heart disease Mother   . Hypertension Mother   . Hyperlipidemia Mother   . Stroke Father   . Heart attack Father   . Hypertension Father   . Heart disease Father   . Alcohol abuse Father   . Heart disease Brother     Social History Social History  Substance Use Topics  . Smoking status: Never Smoker  . Smokeless tobacco: Never Used  . Alcohol use 3.6 oz/week    6 Cans of beer per week     Comment: "one quart" 2-3 times per week    Review of Systems  Constitutional: No fever/chills Eyes: No visual changes. ENT: No sore throat.  Cardiovascular: Denies chest pain. Respiratory: Denies shortness of breath. Gastrointestinal: See history of present illness Genitourinary: Negative for dysuria. Musculoskeletal: Negative for back pain. Skin: Negative for rash. Neurological: Negative for headaches, focal weakness or numbness.   ____________________________________________   PHYSICAL EXAM:  VITAL SIGNS: ED Triage Vitals  Enc Vitals Group     BP 03/25/17 1656 (!) 172/89     Pulse Rate 03/25/17 1656 85     Resp --      Temp 03/25/17 1656 98.5 F (36.9 C)     Temp Source 03/25/17 1656 Oral     SpO2 03/25/17 1656 94 %     Weight 03/25/17 1657 (!) 345 lb (156.5 kg)     Height 03/25/17 1657 6\' 3"  (1.905 m)     Head Circumference --      Peak Flow --      Pain Score 03/25/17 1658 6     Pain Loc --      Pain Edu? --      Excl. in GC? --     Constitutional: Alert and oriented. Well appearing and in no acute distress. Eyes: Conjunctivae are normal.  Head: Atraumatic. Nose: No congestion/rhinnorhea. Mouth/Throat: Mucous  membranes are moist.  Oropharynx non-erythematous. Neck: No stridor. Cardiovascular: Normal rate, regular rhythm. Grossly normal heart sounds.  Good peripheral circulation. Respiratory: Normal respiratory effort.  No retractions. Lungs CTAB. Gastrointestinal: Soft and nontender. No distention. No abdominal bruits. No CVA tenderness. Musculoskeletal: No lower extremity tenderness nor edema.  No joint effusions. Neurologic:  Normal speech and language. No gross focal neurologic deficits are appreciated. No gait instability. Skin:  Skin is warm, dry and intact. No rash noted.  ____________________________________________   LABS (all labs ordered are listed, but only abnormal results are displayed)  Labs Reviewed  COMPREHENSIVE METABOLIC PANEL - Abnormal; Notable for the following:       Result Value   Chloride 99 (*)    Glucose, Bld 128 (*)    Creatinine, Ser 1.64 (*)    Total Protein 8.5 (*)    AST 91 (*)    Alkaline Phosphatase 142 (*)    GFR calc non Af Amer 52 (*)    GFR calc Af Amer 60 (*)    All other components within normal limits  CBC - Abnormal; Notable for the following:    RBC 4.21 (*)    Hemoglobin 12.1 (*)    HCT 36.2 (*)    RDW 21.6 (*)    Platelets 119 (*)    All other components within normal limits  URINALYSIS, COMPLETE (UACMP) WITH MICROSCOPIC - Abnormal; Notable for the following:    Color, Urine AMBER (*)    APPearance CLEAR (*)    Hgb urine dipstick LARGE (*)    Ketones, ur 5 (*)    Protein, ur 100 (*)    Squamous Epithelial / LPF 0-5 (*)    All other components within normal limits  URINE CULTURE  LIPASE, BLOOD   ____________________________________________  EKG   ____________________________________________  RADIOLOGY  IMPRESSION: 1. Cirrhotic changes in the liver. 2. 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 3. Nonspecific stranding in the mesentery may be related to mesenteric  collaterals but appear stable. 4. No evidence for bowel obstruction or urinary tract obstruction. 5. Colonic diverticulosis without acute diverticulitis. 6. Stable compression fracture of T11.   Electronically Signed   By: Norva Pavlov M.D.   On: 03/25/2017 18:12   ____________________________________________   PROCEDURES  Procedure(s) performed: Procedures  Critical Care performed:   ____________________________________________   INITIAL IMPRESSION / ASSESSMENT AND PLAN / ED COURSE  Pertinent labs & imaging results that were available during my care of the patient were reviewed by me and considered in my medical decision making (see chart for details).  Patient now complains of headache and earache. Ears TMs are clear patient reports the headache has been coming and going since yesterday. I will CT his head since he says it is severe   head CT is reported as negative. Patient's creatinine improves with fluids. He is not vomiting anymore. I will let him go follow-up with urology and his primary care doc as planned earlier.   ____________________________________________   FINAL CLINICAL IMPRESSION(S) / ED DIAGNOSES  Final diagnoses:  Pain  Hematuria, unspecified type      NEW MEDICATIONS STARTED DURING THIS VISIT:  New Prescriptions   No medications on file     Note:  This document was prepared using Dragon voice recognition software and may include unintentional dictation errors.    Arnaldo NatalMalinda, Pang Robers F, MD 03/25/17 2111

## 2017-03-25 NOTE — ED Notes (Signed)
Pt states he has gotten "too hot" the past few days and states vomiting and blood in his urine, pt has hx of liver failure, awake and alert, mother at bedside, pt on 2L [place]

## 2017-03-25 NOTE — ED Triage Notes (Signed)
Pt seen today and discharged from ED, told if he went home and had bloody emesis to come in. Pt reports 2 episodes of emesis.

## 2017-03-26 ENCOUNTER — Other Ambulatory Visit: Payer: Self-pay | Admitting: Nurse Practitioner

## 2017-03-26 DIAGNOSIS — K922 Gastrointestinal hemorrhage, unspecified: Secondary | ICD-10-CM

## 2017-03-26 DIAGNOSIS — K7031 Alcoholic cirrhosis of liver with ascites: Secondary | ICD-10-CM | POA: Diagnosis not present

## 2017-03-26 DIAGNOSIS — K920 Hematemesis: Secondary | ICD-10-CM | POA: Diagnosis not present

## 2017-03-26 LAB — PROTIME-INR
INR: 1.27
Prothrombin Time: 16 seconds — ABNORMAL HIGH (ref 11.4–15.2)

## 2017-03-26 LAB — CBC
HEMATOCRIT: 34.5 % — AB (ref 40.0–52.0)
HEMOGLOBIN: 11.1 g/dL — AB (ref 13.0–18.0)
MCH: 28 pg (ref 26.0–34.0)
MCHC: 32.2 g/dL (ref 32.0–36.0)
MCV: 87.1 fL (ref 80.0–100.0)
Platelets: 97 10*3/uL — ABNORMAL LOW (ref 150–440)
RBC: 3.96 MIL/uL — ABNORMAL LOW (ref 4.40–5.90)
RDW: 21.5 % — AB (ref 11.5–14.5)
WBC: 7.8 10*3/uL (ref 3.8–10.6)

## 2017-03-26 LAB — COMPREHENSIVE METABOLIC PANEL
ALBUMIN: 3.6 g/dL (ref 3.5–5.0)
ALT: 47 U/L (ref 17–63)
AST: 83 U/L — AB (ref 15–41)
Alkaline Phosphatase: 113 U/L (ref 38–126)
Anion gap: 9 (ref 5–15)
BUN: 16 mg/dL (ref 6–20)
CHLORIDE: 100 mmol/L — AB (ref 101–111)
CO2: 31 mmol/L (ref 22–32)
CREATININE: 1.19 mg/dL (ref 0.61–1.24)
Calcium: 8.7 mg/dL — ABNORMAL LOW (ref 8.9–10.3)
GFR calc Af Amer: >60 mL/min (ref >60)
GFR calc non Af Amer: >60 mL/min (ref >60)
GLUCOSE: 132 mg/dL — AB (ref 65–99)
POTASSIUM: 4.6 mmol/L (ref 3.5–5.1)
Sodium: 140 mmol/L (ref 135–145)
Total Bilirubin: 1.7 mg/dL — ABNORMAL HIGH (ref 0.3–1.2)
Total Protein: 7.9 g/dL (ref 6.5–8.1)

## 2017-03-26 LAB — HEMOGLOBIN AND HEMATOCRIT, BLOOD
HCT: 34.8 % — ABNORMAL LOW (ref 40.0–52.0)
HEMATOCRIT: 35.1 % — AB (ref 40.0–52.0)
HEMOGLOBIN: 11.4 g/dL — AB (ref 13.0–18.0)
HEMOGLOBIN: 11.3 g/dL — AB (ref 13.0–18.0)

## 2017-03-26 LAB — GLUCOSE, CAPILLARY
GLUCOSE-CAPILLARY: 115 mg/dL — AB (ref 65–99)
Glucose-Capillary: 117 mg/dL — ABNORMAL HIGH (ref 65–99)

## 2017-03-26 LAB — MRSA PCR SCREENING: MRSA by PCR: NEGATIVE

## 2017-03-26 MED ORDER — PANTOPRAZOLE SODIUM 40 MG PO TBEC
40.0000 mg | DELAYED_RELEASE_TABLET | Freq: Every day | ORAL | Status: DC
  Administered 2017-03-27: 09:00:00 40 mg via ORAL
  Filled 2017-03-26: qty 1

## 2017-03-26 MED ORDER — ORAL CARE MOUTH RINSE
15.0000 mL | Freq: Two times a day (BID) | OROMUCOSAL | Status: DC
  Administered 2017-03-26 – 2017-03-27 (×3): 15 mL via OROMUCOSAL

## 2017-03-26 MED ORDER — LEVETIRACETAM 750 MG PO TABS
1500.0000 mg | ORAL_TABLET | Freq: Two times a day (BID) | ORAL | Status: DC
  Administered 2017-03-26 – 2017-03-27 (×2): 1500 mg via ORAL
  Filled 2017-03-26 (×4): qty 2

## 2017-03-26 MED ORDER — ONDANSETRON HCL 4 MG/2ML IJ SOLN: 4.0000 mg | Freq: Four times a day (QID) | INTRAMUSCULAR | Status: DC | PRN

## 2017-03-26 MED ORDER — TRAMADOL HCL 50 MG PO TABS
50.0000 mg | ORAL_TABLET | Freq: Four times a day (QID) | ORAL | Status: DC | PRN
  Administered 2017-03-26 – 2017-03-27 (×2): 50 mg via ORAL
  Filled 2017-03-26 (×2): qty 1

## 2017-03-26 MED ORDER — SODIUM CHLORIDE 0.9 % IV SOLN
50.0000 ug/h | INTRAVENOUS | Status: DC
  Administered 2017-03-26 (×2): 50 ug/h via INTRAVENOUS
  Filled 2017-03-26 (×4): qty 1

## 2017-03-26 MED ORDER — PANTOPRAZOLE SODIUM 40 MG IV SOLR: 40.0000 mg | Freq: Two times a day (BID) | INTRAVENOUS | Status: DC

## 2017-03-26 MED ORDER — ALBUTEROL SULFATE (2.5 MG/3ML) 0.083% IN NEBU: 3.0000 mL | INHALATION_SOLUTION | Freq: Four times a day (QID) | RESPIRATORY_TRACT | Status: DC | PRN

## 2017-03-26 MED ORDER — SODIUM CHLORIDE 0.9 % IV SOLN
INTRAVENOUS | Status: DC
  Administered 2017-03-26: 09:00:00 via INTRAVENOUS

## 2017-03-26 MED ORDER — PANTOPRAZOLE SODIUM 40 MG IV SOLR
8.0000 mg/h | INTRAVENOUS | Status: DC
  Administered 2017-03-26 (×2): 8 mg/h via INTRAVENOUS
  Filled 2017-03-26 (×2): qty 80

## 2017-03-26 MED ORDER — ONDANSETRON HCL 4 MG PO TABS: 4.0000 mg | ORAL_TABLET | Freq: Four times a day (QID) | ORAL | Status: DC | PRN

## 2017-03-26 MED ORDER — PANTOPRAZOLE SODIUM 40 MG IV SOLR
80.0000 mg | Freq: Once | INTRAVENOUS | Status: AC
  Administered 2017-03-26: 80 mg via INTRAVENOUS
  Filled 2017-03-26: qty 80

## 2017-03-26 MED ORDER — OCTREOTIDE LOAD VIA INFUSION
50.0000 ug | Freq: Once | INTRAVENOUS | Status: AC
  Administered 2017-03-26: 50 ug via INTRAVENOUS
  Filled 2017-03-26: qty 25

## 2017-03-26 MED ORDER — DEXTROSE 5 % IV SOLN
1.0000 g | INTRAVENOUS | Status: DC
  Administered 2017-03-26: 1 g via INTRAVENOUS
  Filled 2017-03-26: qty 10

## 2017-03-26 NOTE — ED Notes (Addendum)
Pt states he is on chronic 2L of oxygen at home, denies wearing CPAP at home during the night, pt reports hx of COPD.

## 2017-03-26 NOTE — ED Provider Notes (Signed)
[facility] Emergency Department Provider Note   First MD Initiated Contact with Patient 03/25/17 2345     (approximate)  I have reviewed the triage vital signs and the nursing notes.   HISTORY  Chief Complaint Emesis   HPI Howard [name] is a 36 y.o. male with below list of chronic medical conditions including alcoholic cirrhosis of the liver returns to the emergency department with history of 2 additional doses of large-volume emesis with bright red blood large clots as per the patient and his mother. Patient was seen earlier today for vomiting with blood noted in the emesis. As well as upper abdominal discomfort. Patient admits to continued abdominal pain stating that his current pain is 7 out of 10 and located in the upper abdomen. Patient denies any diarrhea or any blood noted in his stool. Patient also denies any dark stools. Patient denies any fever. Patient denies any dizziness.   Past Medical History:  Diagnosis Date  . Alcoholic cirrhosis of liver with ascites (HCC)   . Alcoholism (HCC)   . Anemia   . COPD (chronic obstructive pulmonary disease) (HCC)   . Depression   . Diabetes mellitus, type II (HCC)   . Esophageal varices (HCC)   . Heart disease    irregular heart beat (palpitations) and heart murmur  . Hyperlipemia   . Hypertension   . Liver disease   . Multiple thyroid nodules   . Portal hypertensive gastropathy (HCC)   . Schizophrenia (HCC)   . Seizures [facility])     Patient Active Problem List   Diagnosis Date Noted  . Iron deficiency anemia due to chronic blood loss 02/16/2017  . Nausea 01/18/2017  . Respiratory failure with hypoxia (HCC) 12/14/2016  . Seizures (HCC) 12/12/2016  . DNR (do not resuscitate) discussion 08/11/2016  . Muscle weakness (generalized)   . GI bleed 08/07/2016  . OSA (obstructive sleep apnea) 04/29/2016  . Anemia 04/29/2016  . Thrombocytopenia (HCC) 04/29/2016  . Coagulopathy (HCC) 04/29/2016  . Obesity  04/29/2016  . Controlled type 2 diabetes mellitus without complication (HCC) 01/15/2016  . Essential (primary) hypertension 12/11/2015  . Pure hypercholesterolemia 12/11/2015  . Hepatic encephalopathy (HCC) 10/16/2015  . 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 Elevated transaminase level 07/04/2015  . Alcohol abuse 05/31/2015  . Paranoid schizophrenia (HCC) 03/20/2015    Past Surgical History:  Procedure Laterality Date  . ESOPHAGOGASTRODUODENOSCOPY N/A 10/05/2015   Procedure: ESOPHAGOGASTRODUODENOSCOPY (EGD);  Surgeon: Elnita Maxwell, MD;  Location: [facility] ENDOSCOPY;  Service: Endoscopy;  Laterality: N/A;  . ESOPHAGOGASTRODUODENOSCOPY (EGD) WITH PROPOFOL N/A 08/03/2015   Procedure: ESOPHAGOGASTRODUODENOSCOPY (EGD) WITH PROPOFOL;  Surgeon: Elnita Maxwell, MD;  Location: [facility] ENDOSCOPY;  Service: Endoscopy;  Laterality: N/A;  . ESOPHAGOGASTRODUODENOSCOPY (EGD) WITH PROPOFOL N/A 08/31/2015   Procedure: ESOPHAGOGASTRODUODENOSCOPY (EGD) WITH PROPOFOL;  Surgeon: Elnita Maxwell, MD;  Location: [facility] ENDOSCOPY;  Service: Endoscopy;  Laterality: N/A;  . ESOPHAGOGASTRODUODENOSCOPY (EGD) WITH PROPOFOL N/A 04/04/2016   Procedure: ESOPHAGOGASTRODUODENOSCOPY (EGD) WITH PROPOFOL;  Surgeon: Scot Jun, MD;  Location: [facility] ENDOSCOPY;  Service: Endoscopy;  Laterality: N/A;  . ESOPHAGOGASTRODUODENOSCOPY (EGD) WITH PROPOFOL N/A 11/13/2016   Procedure: ESOPHAGOGASTRODUODENOSCOPY (EGD) WITH PROPOFOL;  Surgeon: Wyline Mood, MD;  Location: [facility] ENDOSCOPY;  Service: Endoscopy;  Laterality: N/A;  . NO PAST SURGERIES      Prior to Admission medications   Medication Sig Start Date End Date Taking? Authorizing Provider  acamprosate (CAMPRAL) 333 MG tablet Take 2 tablets (666 mg total)  by mouth 3 (three) times daily with meals. 03/24/17   [name], [name], MD  albuterol (PROVENTIL HFA;VENTOLIN HFA) 108 (90 Base) MCG/ACT inhaler Inhale 2 puffs into the lungs every 6 (six) hours as  needed for wheezing or shortness of breath. 10/08/16   Shaune Pollack, MD  citalopram (CELEXA) 20 MG tablet Take 1 tablet (20 mg total) by mouth every morning. 03/24/17   [name], [name], MD  clonazePAM (KLONOPIN) 0.5 MG tablet Take 1 tablet (0.5 mg total) by mouth 2 (two) times daily as needed for anxiety. 03/24/17   [name], [name], MD  docusate sodium (COLACE) 100 MG capsule Take 100 mg by mouth daily as needed for mild constipation.    [provider]  ferrous sulfate 325 (65 FE) MG tablet Take 3 tablets (975 mg total) by mouth every Monday, Wednesday, and Friday. With breakfast 01/21/17   [name], NP  folic acid (FOLVITE) 1 MG tablet Take 1 tablet (1 mg total) by mouth daily. 12/19/16   Gouru, Deanna Artis, MD  furosemide (LASIX) 20 MG tablet Take 1 tablet by mouth daily. 12/08/16   [provider]  lactulose (CHRONULAC) 10 GM/15ML solution Take 45 mLs (30 g total) by mouth every 4 (four) hours. Patient taking differently: Take 30 g by mouth 4 (four) times daily.  07/09/16   Katharina Caper, MD  levETIRAcetam (KEPPRA) 750 MG tablet Take 2 tablets (1,500 mg total) by mouth 2 (two) times daily. 12/11/16 02/09/17  Merrily Brittle, MD  losartan (COZAAR) 25 MG tablet Take 1 tablet (25 mg total) by mouth daily. 03/11/17   [name], NP  lurasidone (LATUDA) 80 MG TABS tablet Take 1 tablet (80 mg total) by mouth daily with supper. 03/24/17   [name], [name], MD  metFORMIN (GLUCOPHAGE) 500 MG tablet Take 1 tablet (500 mg total) by mouth 2 (two) times daily with a meal. 05/15/16   Alford Highland, MD  nadolol (CORGARD) 20 MG tablet Take 1 tablet (20 mg total) by mouth daily. 03/11/17   [name], NP  NON FORMULARY     [provider]  rifaximin (XIFAXAN) 550 MG TABS tablet Take 1 tablet (550 mg total) by mouth 2 (two) times daily. 07/09/16   Katharina Caper, MD  spironolactone (ALDACTONE) 50 MG tablet Take 1 tablet (50 mg total) by mouth 2 (two) times daily. 03/02/17    [name], NP  thiamine (VITAMIN B-1) 100 MG tablet Take 100 mg by mouth daily.    [provider]  traZODone (DESYREL) 100 MG tablet Take 1 tablet (100 mg total) by mouth at bedtime as needed for sleep. 03/24/17   [name], [name], MD    Allergies No known drug allergies  Family History  Problem Relation Age of Onset  . Heart disease Mother   . Hypertension Mother   . Hyperlipidemia Mother   . Stroke Father   . Heart attack Father   . Hypertension Father   . Heart disease Father   . Alcohol abuse Father   . Heart disease Brother     Social History Social History  Substance Use Topics  . Smoking status: Never Smoker  . Smokeless tobacco: Never Used  . Alcohol use 3.6 oz/week    6 Cans of beer per week     Comment: "one quart" 2-3 times per week    Review of Systems Constitutional: No fever/chills Eyes: No visual changes. ENT: No sore throat. Cardiovascular: Denies chest pain. Respiratory: Denies shortness  of breath. Gastrointestinal: Positive for abdominal pain right red blood emesis  Genitourinary: Negative for dysuria. Musculoskeletal: Negative for neck pain.  Negative for back pain. Integumentary: Negative for rash. Neurological: Negative for headaches, focal weakness or numbness.   ____________________________________________   PHYSICAL EXAM:  VITAL SIGNS: ED Triage Vitals [03/25/17 2326]  Enc Vitals Group     BP (!) 170/90     Pulse Rate 85     Resp 16     Temp 98.1 F (36.7 C)     Temp Source Oral     SpO2 99 %     Weight      Height      Head Circumference      Peak Flow      Pain Score      Pain Loc      Pain Edu?      Excl. in GC?     Constitutional: Alert and oriented. Well appearing and in no acute distress. Eyes: Conjunctivae are normal. PERRL. EOMI. Head: Atraumatic. Mouth/Throat: Mucous membranes are moist.  Oropharynx non-erythematous. Neck: No stridor.   Cardiovascular: Normal rate, regular rhythm. Good  peripheral circulation. Grossly normal heart sounds. Respiratory: Normal respiratory effort.  No retractions. Lungs CTAB. Gastrointestinal: Right upper quadrant tenderness. No distention.  Musculoskeletal: No lower extremity tenderness nor edema. No gross deformities of extremities. Neurologic:  Normal speech and language. No gross focal neurologic deficits are appreciated.  Skin:  Skin is warm, dry and intact. No rash noted. Psychiatric: Mood and affect are normal. Speech and behavior are normal.  ____________________________________________   LABS (all labs ordered are listed, but only abnormal results are displayed)  Labs Reviewed  CBC - Abnormal; Notable for the following:       Result Value   RBC 3.96 (*)    Hemoglobin 11.1 (*)    HCT 34.5 (*)    RDW 21.5 (*)    Platelets 97 (*)    All other components within normal limits  PROTIME-INR - Abnormal; Notable for the following:    Prothrombin Time 16.0 (*)    All other components within normal limits   ____________________________________________   RADIOLOGY I, [place] [facility] Sharah Finnell, personally viewed and evaluated these images (plain radiographs) as part of my medical decision making, as well as reviewing the written report by the radiologist.  Ct Head Wo Contrast  Result Date: 03/25/2017 CLINICAL DATA:  Headache and dizziness for the past several hours. EXAM: CT HEAD WITHOUT CONTRAST TECHNIQUE: Contiguous axial images were obtained from the base of the skull through the vertex without intravenous contrast. COMPARISON:  12/11/2016 FINDINGS: Brain: No evidence of acute infarction, hemorrhage, hydrocephalus, extra-axial collection or mass lesion/mass effect. Vascular: No hyperdense vessel or unexpected calcification. Skull: Normal. Negative for fracture or focal lesion. Sinuses/Orbits: No acute finding. Other: None IMPRESSION: No acute intracranial abnormality. Electronically Signed   By: Tollie Ethavid  Kwon M.D.   On: 03/25/2017 20:30   Ct  Renal Stone Study  Result Date: 03/25/2017 CLINICAL DATA:  Pt states he has gotten "too hot" the past few days and states he has been vomiting and having blood in his urine. He also c/o Left flank + LLQ pain. No hx kidney stones. pt has hx of liver failure. No hx CA. No hx surg. EXAM: CT ABDOMEN AND PELVIS WITHOUT CONTRAST TECHNIQUE: Multidetector CT imaging of the abdomen and pelvis was performed following the standard protocol without IV contrast. COMPARISON:  10/15/2016 CT FINDINGS: Lower chest: No acute abnormality. Hepatobiliary: There is  a mottled appearance of the liver, associated with nodular contour and consistent with cirrhosis. Umbilical vein is recanalized. Lack of intravenous contrast limits evaluation of the portal vein. The gallbladder is present and is contracted. Pancreas: Unremarkable. No pancreatic ductal dilatation or surrounding inflammatory changes. Spleen: Spleen is enlarged. Numerous collaterals in the left upper quadrant for compatible with splenorenal shunt. Adrenals/Urinary Tract: Adrenal glands are normal in appearance. Kidneys are symmetric in size. No hydronephrosis. No intrarenal stones. Stomach/Bowel: Stomach and small bowel loops are normal in appearance. Numerous colonic diverticula are present. No acute diverticulitis. The appendix is well seen and has a normal appearance. Vascular/Lymphatic: Numerous collaterals are identified within the mesentery. 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 Reproductive: No acute abnormality.  Bladder is decompressed. Other: Nonspecific stranding identified within the mesentery appear stable. Musculoskeletal: Stable compression fracture of T11. IMPRESSION: 1. Cirrhotic changes in the liver. 2. 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 3. Nonspecific stranding in the mesentery may be related to mesenteric collaterals but appear stable. 4. No evidence for bowel  obstruction or urinary tract obstruction. 5. Colonic diverticulosis without acute diverticulitis. 6. Stable compression fracture of T11. Electronically Signed   By: Norva Pavlov M.D.   On: 03/25/2017 18:12    Critical care: CRITICAL CARE Performed by: Darci Current   Total critical care time: 30 minutes  Critical care time was exclusive of separately billable procedures and treating other patients.  Critical care was necessary to treat or prevent imminent or life-threatening deterioration.  Critical care was time spent personally by me on the following activities: development of treatment plan with patient and/or surrogate as well as nursing, discussions with consultants, evaluation of patient's response to treatment, examination of patient, obtaining history from patient or surrogate, ordering and performing treatments and interventions, ordering and review of laboratory studies, ordering and review of radiographic studies, pulse oximetry and re-evaluation of patient's condition.   Procedures   ____________________________________________   INITIAL IMPRESSION / ASSESSMENT AND PLAN / ED COURSE  Pertinent labs & imaging results that were available during my care of the patient were reviewed by me and considered in my medical decision making (see chart for details).  36 year old male with history of cirrhosis of the liver returning to the emergency department with upper GI bleed. Patient given IV octreotide bolus and infusion as well as IV Protonix bolus and infusion. Patient discussed with Dr.Pyreddy for hospital admission for further evaluation and management.      ____________________________________________  FINAL CLINICAL IMPRESSION(S) / ED DIAGNOSES  Final diagnoses:  Upper GI bleed  Alcoholic cirrhosis of liver with ascites (HCC)     MEDICATIONS GIVEN DURING THIS VISIT:  Medications  pantoprazole (PROTONIX) 80 mg in sodium chloride 0.9 % 250 mL (0.32 mg/mL)  infusion (8 mg/hr Intravenous New Bag/Given 03/26/17 0118)  pantoprazole (PROTONIX) injection 40 mg (not administered)  octreotide (SANDOSTATIN) 2 mcg/mL load via infusion 50 mcg (50 mcg Intravenous Bolus from Bag 03/26/17 0120)    And  octreotide (SANDOSTATIN) 500 mcg in sodium chloride 0.9 % 250 mL (2 mcg/mL) infusion (50 mcg/hr Intravenous New Bag/Given 03/26/17 0121)  pantoprazole (PROTONIX) 80 mg in sodium chloride 0.9 % 100 mL IVPB (0 mg Intravenous Stopped 03/26/17 0128)     NEW OUTPATIENT MEDICATIONS STARTED DURING THIS VISIT:  New Prescriptions   No medications on file    Modified Medications   No medications on file    Discontinued Medications  No medications on file     Note:  This document was prepared using Dragon voice recognition software and may include unintentional dictation errors.    Darci Current, MD 03/26/17 [phone]

## 2017-03-26 NOTE — ED Notes (Signed)
Pt sleeping at this time, respirations unlabored at this time.

## 2017-03-26 NOTE — H&P (Signed)
[facility] [place] [facility]   PATIENT NAME: Howard [name]    MR#:  [id number]  DATE OF BIRTH:  [birth year]  DATE OF ADMISSION:  03/25/2017  PRIMARY CARE PHYSICIAN: Galen ManilaKennedy, [name], NP   REQUESTING/REFERRING PHYSICIAN:   CHIEF COMPLAINT:   Chief Complaint  Patient presents with  . Emesis    HISTORY OF PRESENT ILLNESS: Howard [name]  is a 36 y.o. male with a known history of Cirrhosis of liver, alcoholic liver disease, COPD, type 2 diabetes mellitus, esophageal varices presented to the emergency room after vomiting blood. Patient had couple of episodes of vomiting of blood since last 2 days. He also has mild abdominal discomfort. Patient says he has been compliant with his medication. No complaints of any chest pain and shortness of breath. His hemoglobin in the emergency room is 11.1. Hospitalist service was consulted for further care of the patient. Patient was started on IV octreotide drip and IV Protonix drip in the emergency room. Gastroenterology consulted. CT abdomen showed portal hypertension and cirrhosis.  PAST MEDICAL HISTORY:   Past Medical History:  Diagnosis Date  . Alcoholic cirrhosis of liver with ascites (HCC)   . Alcoholism (HCC)   . Anemia   . COPD (chronic obstructive pulmonary disease) (HCC)   . Depression   . Diabetes mellitus, type II (HCC)   . Esophageal varices (HCC)   . Heart disease    irregular heart beat (palpitations) and heart murmur  . Hyperlipemia   . Hypertension   . Liver disease   . Multiple thyroid nodules   . Portal hypertensive gastropathy (HCC)   . Schizophrenia (HCC)   . Seizures (HCC)     PAST SURGICAL HISTORY: Past Surgical History:  Procedure Laterality Date  . ESOPHAGOGASTRODUODENOSCOPY N/A 10/05/2015   Procedure: ESOPHAGOGASTRODUODENOSCOPY (EGD);  Surgeon: Elnita MaxwellMatthew Gordon Rein, MD;  [facility];  Service: Endoscopy;  Laterality: N/A;  . ESOPHAGOGASTRODUODENOSCOPY (EGD) WITH PROPOFOL  N/A 08/03/2015   Procedure: ESOPHAGOGASTRODUODENOSCOPY (EGD) WITH PROPOFOL;  Surgeon: Elnita MaxwellMatthew Gordon Rein, MD;  [facility];  Service: Endoscopy;  Laterality: N/A;  . ESOPHAGOGASTRODUODENOSCOPY (EGD) WITH PROPOFOL N/A 08/31/2015   Procedure: ESOPHAGOGASTRODUODENOSCOPY (EGD) WITH PROPOFOL;  Surgeon: Elnita MaxwellMatthew Gordon Rein, MD;  [facility];  Service: Endoscopy;  Laterality: N/A;  . ESOPHAGOGASTRODUODENOSCOPY (EGD) WITH PROPOFOL N/A 04/04/2016   Procedure: ESOPHAGOGASTRODUODENOSCOPY (EGD) WITH PROPOFOL;  Surgeon: Scot Junobert T Elliott, MD;  [facility] END[facility]COPY;  Service: Endoscopy;  Laterality: N/A;  . ESOPHAGOGASTRODUODENOSCOPY (EGD) WITH PROPOFOL N/A 11/13/2016   Procedure: ESOPHAGOGASTRODUODENOSCOPY (EGD) WITH PROPOFOL;  Surgeon: Wyline MoodKiran Anna, MD;  Location: [facility] ENDOSCOPY;  Service: Endoscopy;  Laterality: N/A;  . NO PAST SURGERIES      SOCIAL HISTORY:  Social History  Substance Use Topics  . Smoking status: Never Smoker  . Smokeless tobacco: Never Used  . Alcohol use 3.6 oz/week    6 Cans of beer per week     Comment: "one quart" 2-3 times per week    FAMILY HISTORY:  Family History  Problem Relation Age of Onset  . Heart disease Mother   . Hypertension Mother   . Hyperlipidemia Mother   . Stroke Father   . Heart attack Father   . Hypertension Father   . Heart disease Father   . Alcohol abuse Father   . Heart disease Brother     DRUG ALLERGIES: No Known Allergies  REVIEW OF SYSTEMS:   CONSTITUTIONAL: No fever, has weakness.  EYES: No blurred or double vision.  EARS, NOSE, AND THROAT: No tinnitus or ear pain.  RESPIRATORY: No cough, shortness of breath, wheezing or hemoptysis.  CARDIOVASCULAR: No chest pain, orthopnea, edema.  GASTROINTESTINAL: No nausea,  vomiting of blood, mild abdominal pain present No diarrhea GENITOURINARY: No dysuria, hematuria.  ENDOCRINE: No polyuria, nocturia,  HEMATOLOGY: No anemia, easy bruising or bleeding SKIN: No  rash or lesion. MUSCULOSKELETAL: No joint pain or arthritis.   NEUROLOGIC: No tingling, numbness, weakness.  PSYCHIATRY: No anxiety or depression.   MEDICATIONS AT HOME:  Prior to Admission medications   Medication Sig Start Date End Date Taking? Authorizing Provider  acamprosate (CAMPRAL) 333 MG tablet Take 2 tablets (666 mg total) by mouth 3 (three) times daily with meals. 03/24/17   [name], [name], MD  albuterol (PROVENTIL HFA;VENTOLIN HFA) 108 (90 Base) MCG/ACT inhaler Inhale 2 puffs into the lungs every 6 (six) hours as needed for wheezing or shortness of breath. 10/08/16   Shaune Pollack, MD  citalopram (CELEXA) 20 MG tablet Take 1 tablet (20 mg total) by mouth every morning. 03/24/17   [name], [name], MD  clonazePAM (KLONOPIN) 0.5 MG tablet Take 1 tablet (0.5 mg total) by mouth 2 (two) times daily as needed for anxiety. 03/24/17   [name], [name], MD  docusate sodium (COLACE) 100 MG capsule Take 100 mg by mouth daily as needed for mild constipation.    [provider]  ferrous sulfate 325 (65 FE) MG tablet Take 3 tablets (975 mg total) by mouth every Monday, Wednesday, and Friday. With breakfast 01/21/17   [name], NP  folic acid (FOLVITE) 1 MG tablet Take 1 tablet (1 mg total) by mouth daily. 12/19/16   Gouru, Deanna Artis, MD  furosemide (LASIX) 20 MG tablet Take 1 tablet by mouth daily. 12/08/16   [provider]  lactulose (CHRONULAC) 10 GM/15ML solution Take 45 mLs (30 g total) by mouth every 4 (four) hours. Patient taking differently: Take 30 g by mouth 4 (four) times daily.  07/09/16   Katharina Caper, MD  levETIRAcetam (KEPPRA) 750 MG tablet Take 2 tablets (1,500 mg total) by mouth 2 (two) times daily. 12/11/16 02/09/17  Merrily Brittle, MD  losartan (COZAAR) 25 MG tablet Take 1 tablet (25 mg total) by mouth daily. 03/11/17   [name], NP  lurasidone (LATUDA) 80 MG TABS tablet Take 1 tablet (80 mg total) by mouth daily with supper. 03/24/17   [name], [name], MD   metFORMIN (GLUCOPHAGE) 500 MG tablet Take 1 tablet (500 mg total) by mouth 2 (two) times daily with a meal. 05/15/16   Alford Highland, MD  nadolol (CORGARD) 20 MG tablet Take 1 tablet (20 mg total) by mouth daily. 03/11/17   [name], NP  NON FORMULARY     [provider]  rifaximin (XIFAXAN) 550 MG TABS tablet Take 1 tablet (550 mg total) by mouth 2 (two) times daily. 07/09/16   Katharina Caper, MD  spironolactone (ALDACTONE) 50 MG tablet Take 1 tablet (50 mg total) by mouth 2 (two) times daily. 03/02/17   [name], NP  thiamine (VITAMIN B-1) 100 MG tablet Take 100 mg by mouth daily.    [provider]  traZODone (DESYREL) 100 MG tablet Take 1 tablet (100 mg total) by mouth at bedtime as needed for sleep. 03/24/17   [name], [name], MD      PHYSICAL EXAMINATION:   VITAL SIGNS: Blood pressure (!) 162/91, pulse 78, temperature 98.3 F (36.8 C), temperature source Oral, resp.  rate 18, SpO2 95 %.  GENERAL:  36 y.o.-year-old patient lying in the bed with no acute distress.  EYES: Pupils equal, round, reactive to light and accommodation. No scleral icterus. Extraocular muscles intact.  HEENT: Head atraumatic, normocephalic. Oropharynx and nasopharynx clear.  NECK:  Supple, no jugular venous distention. No thyroid enlargement, no tenderness.  LUNGS: Normal breath sounds bilaterally, no wheezing, rales,rhonchi or crepitation. No use of accessory muscles of respiration.  CARDIOVASCULAR: S1, S2 normal. No murmurs, rubs, or gallops.  ABDOMEN: Soft, nontender, nondistended. Bowel sounds present. No organomegaly or mass.  EXTREMITIES: No pedal edema, cyanosis, or clubbing.  NEUROLOGIC: Cranial nerves II through XII are intact. Muscle strength 5/5 in all extremities. Sensation intact. Gait not checked.  PSYCHIATRIC: The patient is alert and oriented x 3.  SKIN: No obvious rash, lesion, or ulcer.   LABORATORY PANEL:   CBC  Recent Labs Lab 03/23/17 1507  03/25/17 1705 03/26/17 0044  WBC 7.5 8.2 7.8  HGB 11.7* 12.1* 11.1*  HCT 35.6* 36.2* 34.5*  PLT 128* 119* 97*  MCV 85.5 86.0 87.1  MCH 28.0 28.8 28.0  MCHC 32.8 33.5 32.2  RDW 21.1* 21.6* 21.5*   ------------------------------------------------------------------------------------------------------------------  Chemistries   Recent Labs Lab 03/25/17 1705 03/25/17 1957  NA 138 138  K 4.0 3.8  CL 99* 98*  CO2 31 32  GLUCOSE 128* 91  BUN 14 14  CREATININE 1.64* 1.51*  CALCIUM 9.1 8.8*  AST 91*  --   ALT 50  --   ALKPHOS 142*  --   BILITOT 1.1  --    ------------------------------------------------------------------------------------------------------------------ estimated creatinine clearance is 106.3 mL/min (A) (by C-G formula based on SCr of 1.51 mg/dL (H)). ------------------------------------------------------------------------------------------------------------------ No results for input(s): TSH, T4TOTAL, T3FREE, THYROIDAB in the last 72 hours.  Invalid input(s): FREET3   Coagulation profile  Recent Labs Lab 03/25/17 1957 03/26/17 0044  INR 1.27 1.27   ------------------------------------------------------------------------------------------------------------------- No results for input(s): DDIMER in the last 72 hours. -------------------------------------------------------------------------------------------------------------------  Cardiac Enzymes No results for input(s): CKMB, TROPONINI, MYOGLOBIN in the last 168 hours.  Invalid input(s): CK ------------------------------------------------------------------------------------------------------------------ Invalid input(s): POCBNP  ---------------------------------------------------------------------------------------------------------------  Urinalysis    Component Value Date/Time   COLORURINE AMBER (A) 03/25/2017 1705   APPEARANCEUR CLEAR (A) 03/25/2017 1705   APPEARANCEUR Clear 12/25/2014 1404    LABSPEC 1.027 03/25/2017 1705   LABSPEC 1.016 12/25/2014 1404   PHURINE 6.0 03/25/2017 1705   GLUCOSEU NEGATIVE 03/25/2017 1705   GLUCOSEU Negative 12/25/2014 1404   HGBUR LARGE (A) 03/25/2017 1705   BILIRUBINUR NEGATIVE 03/25/2017 1705   BILIRUBINUR Negative 12/25/2014 1404   KETONESUR 5 (A) 03/25/2017 1705   PROTEINUR 100 (A) 03/25/2017 1705   NITRITE NEGATIVE 03/25/2017 1705   LEUKOCYTESUR NEGATIVE 03/25/2017 1705   LEUKOCYTESUR Negative 12/25/2014 1404     RADIOLOGY: Ct Head Wo Contrast  Result Date: 03/25/2017 CLINICAL DATA:  Headache and dizziness for the past several hours. EXAM: CT HEAD WITHOUT CONTRAST TECHNIQUE: Contiguous axial images were obtained from the base of the skull through the vertex without intravenous contrast. COMPARISON:  12/11/2016 FINDINGS: Brain: No evidence of acute infarction, hemorrhage, hydrocephalus, extra-axial collection or mass lesion/mass effect. Vascular: No hyperdense vessel or unexpected calcification. Skull: Normal. Negative for fracture or focal lesion. Sinuses/Orbits: No acute finding. Other: None IMPRESSION: No acute intracranial abnormality. Electronically Signed   By: Tollie Eth M.D.   On: 03/25/2017 20:30   Ct Renal Stone Study  Result Date: 03/25/2017 CLINICAL DATA:  Pt states he has gotten "too hot" the  past few days and states he has been vomiting and having blood in his urine. He also c/o Left flank + LLQ pain. No hx kidney stones. pt has hx of liver failure. No hx CA. No hx surg. EXAM: CT ABDOMEN AND PELVIS WITHOUT CONTRAST TECHNIQUE: Multidetector CT imaging of the abdomen and pelvis was performed following the standard protocol without IV contrast. COMPARISON:  10/15/2016 CT FINDINGS: Lower chest: No acute abnormality. Hepatobiliary: There is a mottled appearance of the liver, associated with nodular contour and consistent with cirrhosis. Umbilical vein is recanalized. Lack of intravenous contrast limits evaluation of the portal vein. The  gallbladder is present and is contracted. Pancreas: Unremarkable. No pancreatic ductal dilatation or surrounding inflammatory changes. Spleen: Spleen is enlarged. Numerous collaterals in the left upper quadrant for compatible with splenorenal shunt. Adrenals/Urinary Tract: Adrenal glands are normal in appearance. Kidneys are symmetric in size. No hydronephrosis. No intrarenal stones. Stomach/Bowel: Stomach and small bowel loops are normal in appearance. Numerous colonic diverticula are present. No acute diverticulitis. The appendix is well seen and has a normal appearance. Vascular/Lymphatic: Numerous collaterals are identified within the mesentery. 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 Reproductive: No acute abnormality.  Bladder is decompressed. Other: Nonspecific stranding identified within the mesentery appear stable. Musculoskeletal: Stable compression fracture of T11. IMPRESSION: 1. Cirrhotic changes in the liver. 2. 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 3. Nonspecific stranding in the mesentery may be related to mesenteric collaterals but appear stable. 4. No evidence for bowel obstruction or urinary tract obstruction. 5. Colonic diverticulosis without acute diverticulitis. 6. Stable compression fracture of T11. Electronically Signed   By: Norva Pavlov M.D.   On: 03/25/2017 18:12    EKG: Orders placed or performed during the hospital encounter of 02/03/17  . EKG 12-Lead  . EKG 12-Lead    IMPRESSION AND PLAN: 36 year old male patient with history of esophageal varices, cirrhosis of liver, alcoholic liver disease, COPD, type 2 diabetes mellitus presented to the emergency room with vomiting of blood. Admitting diagnosis 1. Acute upper GI bleed 2. Cirrhosis of liver 3. 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 Diabetes mellitus type 2 Treatment plan Admit patient to stepdown unit IV fluid  hydration Nothing by mouth IV Protonix drip IV octreotide drip Serial hemoglobin and hematocrit monitoring Gastroenterology consultation for endoscopy  All the records are reviewed and case discussed with ED provider. Management plans discussed with the patient, family and they are in agreement.  CODE STATUS:FULL CODE Code Status History    Date Active Date Inactive Code Status Order ID Comments User Context   01/18/2017  4:35 PM 01/19/2017  3:16 PM Full Code [id number]  Marguarite Arbour, MD Inpatient   01/05/2017  2:57 PM 01/05/2017  9:46 PM Full Code [id number]  Arnaldo Natal, MD ED   12/30/2016  2:51 AM 01/01/2017  2:33 PM Full Code [id number]  Oralia Manis, MD Inpatient   12/18/2016 12:25 AM 12/19/2016  9:28 PM Full Code [id number]  Katharina Caper, MD Inpatient   12/12/2016 11:40 PM 12/15/2016  8:28 PM Full Code [id number]  Arnaldo Natal, MD Inpatient   11/24/2016  1:11 AM 11/25/2016  4:42 PM Full Code [id number]  Tonye Royalty, DO Inpatient   11/12/2016  3:36 PM 11/14/2016  2:36 PM Full Code [id number]  Auburn Bilberry, MD Inpatient   10/04/2016  6:04 PM 10/08/2016  3:44 PM Full Code [id number]  Shaune Pollack, MD Inpatient   09/22/2016  2:29  AM 09/23/2016  6:38 PM Full Code [id number]  Oralia Manis, MD Inpatient   08/07/2016  7:37 PM 08/12/2016  8:51 PM Full Code [id number]  Gracelyn Nurse, MD Inpatient   07/07/2016  1:40 AM 07/09/2016  6:29 PM Full Code [id number]  Ihor Austin, MD Inpatient   06/10/2016  6:21 AM 06/11/2016  8:25 PM Full Code [id number]  Ihor Austin, MD Inpatient   05/12/2016  3:55 AM 05/15/2016  2:57 PM Full Code [id number]  Ihor Austin, MD Inpatient   04/28/2016  2:54 AM 04/29/2016  6:23 PM Full Code [id number]  Hugelmeyer, Alexis, DO Inpatient   03/14/2016 12:30 PM 03/16/2016  3:41 PM Full Code [id number]  Shaune Pollack, MD Inpatient   03/03/2016 12:13 PM 03/06/2016  2:26 PM Full Code [id number]  Milagros Loll, MD ED   02/10/2016  5:54 PM 02/12/2016  7:34 PM Full Code [id number]  Ramonita Lab,  MD Inpatient   11/11/2015  5:16 AM 11/13/2015  6:00 PM Full Code [id number]  Ihor Austin, MD Inpatient   11/07/2015 10:02 PM 11/09/2015  4:56 PM Full Code [id number]  Enid Baas, MD Inpatient   10/28/2015  8:14 PM 10/29/2015  2:26 PM Full Code [id number]  Altamese Dilling, MD Inpatient   10/16/2015 11:47 PM 10/17/2015  6:00 PM Full Code [id number]  Oralia Manis, MD Inpatient   07/30/2015  5:35 PM 08/04/2015  8:34 PM Full Code [id number]  Enedina Finner, MD Inpatient   07/05/2015  1:16 AM 07/08/2015  7:22 PM Full Code [id number]  Oralia Manis, MD Inpatient   05/30/2015  7:28 PM 06/01/2015  3:36 PM Full Code [id number]  [name], [name], MD Inpatient       TOTAL TIME TAKING CARE OF THIS PATIENT: 55 minutes.    Ihor Austin M.D on 03/26/2017 at 3:36 AM  Between 7am to 6pm - Pager - [phone]  After 6pm go to [email or web address] - password [email or web address] [facility]  [facility]  Office  [phone]  CC: Primary care physician; [name], NP

## 2017-03-26 NOTE — Progress Notes (Signed)
Report given to Lupita Leashonna, RN for assigned room 110. New order for Keppra on MAR not present on unit at this time. Request sent to pharmacy to send to new unit.

## 2017-03-26 NOTE — Consult Note (Signed)
Midge Minium, MD [facility]  [address], [address] [place], [place] [address] Phone: [phone] Fax : [phone]  Consultation  Referring Provider:     Dr. Tobi Bastos Primary Care Physician:  [name], NP Primary Gastroenterologist:  Dr. Mechele Collin         Reason for Consultation:     Hematemesis  Date of Admission:  03/25/2017 Date of Consultation:  03/26/2017         HPI:   Howard [name] is a 36 y.o. male who was brought in for hematemesis. The patient reports that he has hematemesis 2 right before admission. The patient was sent to the ICU and has a history of hematemesis in the past. The patient's last admission was reported hematemesis with heme-negative stools. He does have a history of esophageal varices that were banded by Dr. Mechele Collin. The patient then had a repeat EGD by Dr. Tobi Bastos in February of this year without any varices seen. The patient's hemoglobin on his previous admission was around with his hemoglobin 1 month ago 10.8 with his present hemoglobin being 11.3. The patient reports abdominal pain that started after he had been retching. There is no report of any issues prior to the nausea vomiting with hematemesis. The patient also reports that he quit drinking 3 days ago. The patient was encouraged to stop drinking last time he was admitted to the hospital but states that he did not heed those recommendations but now he is ready to stop drinking.  Past Medical History:  Diagnosis Date  . Alcoholic cirrhosis of liver with ascites (HCC)   . Alcoholism (HCC)   . Anemia   . COPD (chronic obstructive pulmonary disease) (HCC)   . Depression   . Diabetes mellitus, type II (HCC)   . Esophageal varices (HCC)   . Heart disease    irregular heart beat (palpitations) and heart murmur  . Hyperlipemia   . Hypertension   . Liver disease   . Multiple thyroid nodules   . Portal hypertensive gastropathy (HCC)   . Schizophrenia (HCC)   . Seizures (HCC)     Past Surgical  History:  Procedure Laterality Date  . ESOPHAGOGASTRODUODENOSCOPY N/A 10/05/2015   Procedure: ESOPHAGOGASTRODUODENOSCOPY (EGD);  Surgeon: Elnita Maxwell, MD;  Location: [facility] ENDOSCOPY;  Service: Endoscopy;  Laterality: N/A;  . ESOPHAGOGASTRODUODENOSCOPY (EGD) WITH PROPOFOL N/A 08/03/2015   Procedure: ESOPHAGOGASTRODUODENOSCOPY (EGD) WITH PROPOFOL;  Surgeon: Elnita Maxwell, MD;  Location: [facility] ENDOSCOPY;  Service: Endoscopy;  Laterality: N/A;  . ESOPHAGOGASTRODUODENOSCOPY (EGD) WITH PROPOFOL N/A 08/31/2015   Procedure: ESOPHAGOGASTRODUODENOSCOPY (EGD) WITH PROPOFOL;  Surgeon: Elnita Maxwell, MD;  Location: [facility] ENDOSCOPY;  Service: Endoscopy;  Laterality: N/A;  . ESOPHAGOGASTRODUODENOSCOPY (EGD) WITH PROPOFOL N/A 04/04/2016   Procedure: ESOPHAGOGASTRODUODENOSCOPY (EGD) WITH PROPOFOL;  Surgeon: Scot Jun, MD;  Location: [facility] ENDOSCOPY;  Service: Endoscopy;  Laterality: N/A;  . ESOPHAGOGASTRODUODENOSCOPY (EGD) WITH PROPOFOL N/A 11/13/2016   Procedure: ESOPHAGOGASTRODUODENOSCOPY (EGD) WITH PROPOFOL;  Surgeon: Wyline Mood, MD;  Location: [facility] ENDOSCOPY;  Service: Endoscopy;  Laterality: N/A;  . NO PAST SURGERIES      Prior to Admission medications   Medication Sig Start Date End Date Taking? Authorizing Provider  acamprosate (CAMPRAL) 333 MG tablet Take 2 tablets (666 mg total) by mouth 3 (three) times daily with meals. 03/24/17   [name], [name], MD  albuterol (PROVENTIL HFA;VENTOLIN HFA) 108 (90 Base) MCG/ACT inhaler Inhale 2 puffs into the lungs every 6 (six) hours as needed for wheezing or shortness of breath.  10/08/16   Shaune Pollack, MD  citalopram (CELEXA) 20 MG tablet Take 1 tablet (20 mg total) by mouth every morning. 03/24/17   [name], [name], MD  clonazePAM (KLONOPIN) 0.5 MG tablet Take 1 tablet (0.5 mg total) by mouth 2 (two) times daily as needed for anxiety. 03/24/17   [name], [name], MD  docusate sodium (COLACE) 100 MG capsule Take 100 mg by mouth daily as needed for mild  constipation.    [provider]  ferrous sulfate 325 (65 FE) MG tablet Take 3 tablets (975 mg total) by mouth every Monday, Wednesday, and Friday. With breakfast 01/21/17   [name], NP  folic acid (FOLVITE) 1 MG tablet Take 1 tablet (1 mg total) by mouth daily. 12/19/16   Gouru, Deanna Artis, MD  furosemide (LASIX) 20 MG tablet Take 1 tablet by mouth daily. 12/08/16   [provider]  lactulose (CHRONULAC) 10 GM/15ML solution Take 45 mLs (30 g total) by mouth every 4 (four) hours. Patient taking differently: Take 30 g by mouth 4 (four) times daily.  07/09/16   Katharina Caper, MD  levETIRAcetam (KEPPRA) 750 MG tablet Take 2 tablets (1,500 mg total) by mouth 2 (two) times daily. 12/11/16 02/09/17  Merrily Brittle, MD  losartan (COZAAR) 25 MG tablet Take 1 tablet (25 mg total) by mouth daily. 03/11/17   [name], NP  lurasidone (LATUDA) 80 MG TABS tablet Take 1 tablet (80 mg total) by mouth daily with supper. 03/24/17   [name], [name], MD  metFORMIN (GLUCOPHAGE) 500 MG tablet Take 1 tablet (500 mg total) by mouth 2 (two) times daily with a meal. 05/15/16   Alford Highland, MD  nadolol (CORGARD) 20 MG tablet Take 1 tablet (20 mg total) by mouth daily. 03/11/17   [name], NP  NON FORMULARY     [provider]  rifaximin (XIFAXAN) 550 MG TABS tablet Take 1 tablet (550 mg total) by mouth 2 (two) times daily. 07/09/16   Katharina Caper, MD  spironolactone (ALDACTONE) 50 MG tablet Take 1 tablet (50 mg total) by mouth 2 (two) times daily. 03/02/17   [name], NP  thiamine (VITAMIN B-1) 100 MG tablet Take 100 mg by mouth daily.    [provider]  traZODone (DESYREL) 100 MG tablet Take 1 tablet (100 mg total) by mouth at bedtime as needed for sleep. 03/24/17   [name], [name], MD    Family History  Problem Relation Age of Onset  . Heart disease Mother   . Hypertension Mother   . Hyperlipidemia Mother   . Stroke Father   . Heart  attack Father   . Hypertension Father   . Heart disease Father   . Alcohol abuse Father   . Heart disease Brother      Social History  Substance Use Topics  . Smoking status: Never Smoker  . Smokeless tobacco: Never Used  . Alcohol use 3.6 oz/week    6 Cans of beer per week     Comment: "one quart" 2-3 times per week    Allergies as of 03/25/2017  . (No Known Allergies)    Review of Systems:    All systems reviewed and negative except where noted in HPI.   Physical Exam:  Vital signs in last 24 hours: Temp:  [97.5 F (36.4 C)-98.5 F (36.9 C)] 97.5 F (36.4 C) (07/05 1238) Pulse Rate:  [67-85] 77 (07/05 1238) Resp:  [16-25] 18 (07/05 1238) BP: (142-174)/(68-99) 150/68 (07/05  1238) SpO2:  [90 %-99 %] 95 % (07/05 1238) Weight:  [345 lb (156.5 kg)-348 lb 8 oz (158.1 kg)] 348 lb 8 oz (158.1 kg) (07/05 1238) Last BM Date: 03/24/17 General:   Pleasant, cooperative in NAD Head:  Normocephalic and atraumatic. Eyes:   No icterus.   Conjunctiva pink. PERRLA. Ears:  Normal auditory acuity. Neck:  Supple; no masses or thyroidomegaly Lungs: Respirations even and unlabored. Lungs clear to auscultation bilaterally.   No wheezes, crackles, or rhonchi.  Heart:  Regular rate and rhythm;  Without murmur, clicks, rubs or gallops Abdomen:  Soft, nondistended, positive tenderness to 1 finger palpation while flexing the abdominal wall muscles. Normal bowel sounds. No appreciable masses or hepatomegaly.  No rebound or guarding.  Rectal:  Not performed. Msk:  Symmetrical without gross deformities.  Strength  Extremities:  Without edema, cyanosis or clubbing. Neurologic:  Alert and oriented x3;  grossly normal neurologically. Skin:  Intact without significant lesions or rashes. Cervical Nodes:  No significant cervical adenopathy. Psych:  Alert and cooperative. Normal affect.  LAB RESULTS:  Recent Labs  03/23/17 1507 03/25/17 1705 03/26/17 0044 03/26/17 0423 03/26/17 1036  WBC 7.5  8.2 7.8  --   --   HGB 11.7* 12.1* 11.1* 11.4* 11.3*  HCT 35.6* 36.2* 34.5* 34.8* 35.1*  PLT 128* 119* 97*  --   --    BMET  Recent Labs  03/25/17 1705 03/25/17 1957 03/26/17 1036  NA 138 138 140  K 4.0 3.8 4.6  CL 99* 98* 100*  CO2 31 32 31  GLUCOSE 128* 91 132*  BUN 14 14 16   CREATININE 1.64* 1.51* 1.19  CALCIUM 9.1 8.8* 8.7*   LFT  Recent Labs  03/26/17 1036  PROT 7.9  ALBUMIN 3.6  AST 83*  ALT 47  ALKPHOS 113  BILITOT 1.7*   PT/INR  Recent Labs  03/25/17 1957 03/26/17 0044  LABPROT 16.0* 16.0*  INR 1.27 1.27    STUDIES: Ct Head Wo Contrast  Result Date: 03/25/2017 CLINICAL DATA:  Headache and dizziness for the past several hours. EXAM: CT HEAD WITHOUT CONTRAST TECHNIQUE: Contiguous axial images were obtained from the base of the skull through the vertex without intravenous contrast. COMPARISON:  12/11/2016 FINDINGS: Brain: No evidence of acute infarction, hemorrhage, hydrocephalus, extra-axial collection or mass lesion/mass effect. Vascular: No hyperdense vessel or unexpected calcification. Skull: Normal. Negative for fracture or focal lesion. Sinuses/Orbits: No acute finding. Other: None IMPRESSION: No acute intracranial abnormality. Electronically Signed   By: Tollie Ethavid  Kwon M.D.   On: 03/25/2017 20:30   Ct Renal Stone Study  Result Date: 03/25/2017 CLINICAL DATA:  Pt states he has gotten "too hot" the past few days and states he has been vomiting and having blood in his urine. He also c/o Left flank + LLQ pain. No hx kidney stones. pt has hx of liver failure. No hx CA. No hx surg. EXAM: CT ABDOMEN AND PELVIS WITHOUT CONTRAST TECHNIQUE: Multidetector CT imaging of the abdomen and pelvis was performed following the standard protocol without IV contrast. COMPARISON:  10/15/2016 CT FINDINGS: Lower chest: No acute abnormality. Hepatobiliary: There is a mottled appearance of the liver, associated with nodular contour and consistent with cirrhosis. Umbilical vein is  recanalized. Lack of intravenous contrast limits evaluation of the portal vein. The gallbladder is present and is contracted. Pancreas: Unremarkable. No pancreatic ductal dilatation or surrounding inflammatory changes. Spleen: Spleen is enlarged. Numerous collaterals in the left upper quadrant for compatible with splenorenal shunt. Adrenals/Urinary Tract: Adrenal  glands are normal in appearance. Kidneys are symmetric in size. No hydronephrosis. No intrarenal stones. Stomach/Bowel: Stomach and small bowel loops are normal in appearance. Numerous colonic diverticula are present. No acute diverticulitis. The appendix is well seen and has a normal appearance. Vascular/Lymphatic: Numerous collaterals are identified within the mesentery. 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 Reproductive: No acute abnormality.  Bladder is decompressed. Other: Nonspecific stranding identified within the mesentery appear stable. Musculoskeletal: Stable compression fracture of T11. IMPRESSION: 1. Cirrhotic changes in the liver. 2. 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 3. Nonspecific stranding in the mesentery may be related to mesenteric collaterals but appear stable. 4. No evidence for bowel obstruction or urinary tract obstruction. 5. Colonic diverticulosis without acute diverticulitis. 6. Stable compression fracture of T11. Electronically Signed   By: Norva Pavlov M.D.   On: 03/25/2017 18:12      Impression / Plan:   TILMAN MCCLAREN is a 36 y.o. y/o male with  2 episodes of vomiting blood. The patient has had no further vomiting or issues since being admitted to the hospital. The patient will have his octreotide stopped since it is unlikely that he has variceal bleeding with his EGD being normal active February without any esophageal varices. The patient also has had no further episodes of vomiting. The patient has been encouraged to  stop alcohol abuse. Nothing further to do from a GI point of view with this patient who has clearly remained stable from a hemoglobin point of view and has had no further bleeding. The patient should follow-up with Dr. Mechele Collin as an outpatient. I will sign off.  Please call if any further GI concerns or questions.  We would like to thank you for the opportunity to participate in the care of Howard [name].   Thank you for involving me in the care of this patient.      LOS: 0 days   Midge Minium, MD  03/26/2017, 1:25 PM   Note: This dictation was prepared with Dragon dictation along with smaller phrase technology. Any transcriptional errors that result from this process are unintentional.

## 2017-03-26 NOTE — Progress Notes (Signed)
Subjective: Patient admitted overnight for potential GI bleed. Currently patient is complaining of nausea but has not vomited since admission. These have been vomiting coffee-ground emesis for a week. However is guaiac negative here in hemoglobin is stable. He had an endoscopy back in February which did not show any varices.  Objective: Vital signs in last 24 hours: Temp:  [97.5 F (36.4 C)-98.5 F (36.9 C)] 97.5 F (36.4 C) (07/05 1238) Pulse Rate:  [67-85] 77 (07/05 1238) Resp:  [16-25] 18 (07/05 1238) BP: (142-174)/(68-99) 150/68 (07/05 1238) SpO2:  [90 %-99 %] 95 % (07/05 1238) Weight:  [156.5 kg (345 lb)-158.1 kg (348 lb 8 oz)] 158.1 kg (348 lb 8 oz) (07/05 1238) Weight change:  Last BM Date: 03/24/17  Intake/Output from previous day: 07/04 0701 - 07/05 0700 In: 100 [IV Piggyback:100] Out: -  Intake/Output this shift: No intake/output data recorded.  General appearance: Obese white male in no acute distress GI: soft, non-tender; bowel sounds normal; no masses,  no organomegaly  Lab Results:  Recent Labs  03/25/17 1705 03/26/17 0044 03/26/17 0423 03/26/17 1036  WBC 8.2 7.8  --   --   HGB 12.1* 11.1* 11.4* 11.3*  HCT 36.2* 34.5* 34.8* 35.1*  PLT 119* 97*  --   --    BMET  Recent Labs  03/25/17 1957 03/26/17 1036  NA 138 140  K 3.8 4.6  CL 98* 100*  CO2 32 31  GLUCOSE 91 132*  BUN 14 16  CREATININE 1.51* 1.19  CALCIUM 8.8* 8.7*    Studies/Results: Ct Head Wo Contrast  Result Date: 03/25/2017 CLINICAL DATA:  Headache and dizziness for the past several hours. EXAM: CT HEAD WITHOUT CONTRAST TECHNIQUE: Contiguous axial images were obtained from the base of the skull through the vertex without intravenous contrast. COMPARISON:  12/11/2016 FINDINGS: Brain: No evidence of acute infarction, hemorrhage, hydrocephalus, extra-axial collection or mass lesion/mass effect. Vascular: No hyperdense vessel or unexpected calcification. Skull: Normal. Negative for fracture or  focal lesion. Sinuses/Orbits: No acute finding. Other: None IMPRESSION: No acute intracranial abnormality. Electronically Signed   By: Tollie Ethavid  Kwon M.D.   On: 03/25/2017 20:30   Ct Renal Stone Study  Result Date: 03/25/2017 CLINICAL DATA:  Pt states he has gotten "too hot" the past few days and states he has been vomiting and having blood in his urine. He also c/o Left flank + LLQ pain. No hx kidney stones. pt has hx of liver failure. No hx CA. No hx surg. EXAM: CT ABDOMEN AND PELVIS WITHOUT CONTRAST TECHNIQUE: Multidetector CT imaging of the abdomen and pelvis was performed following the standard protocol without IV contrast. COMPARISON:  10/15/2016 CT FINDINGS: Lower chest: No acute abnormality. Hepatobiliary: There is a mottled appearance of the liver, associated with nodular contour and consistent with cirrhosis. Umbilical vein is recanalized. Lack of intravenous contrast limits evaluation of the portal vein. The gallbladder is present and is contracted. Pancreas: Unremarkable. No pancreatic ductal dilatation or surrounding inflammatory changes. Spleen: Spleen is enlarged. Numerous collaterals in the left upper quadrant for compatible with splenorenal shunt. Adrenals/Urinary Tract: Adrenal glands are normal in appearance. Kidneys are symmetric in size. No hydronephrosis. No intrarenal stones. Stomach/Bowel: Stomach and small bowel loops are normal in appearance. Numerous colonic diverticula are present. No acute diverticulitis. The appendix is well seen and has a normal appearance. Vascular/Lymphatic: Numerous collaterals are identified within the mesentery. 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 Reproductive: No acute abnormality.  Bladder is decompressed.  Other: Nonspecific stranding identified within the mesentery appear stable. Musculoskeletal: Stable compression fracture of T11. IMPRESSION: 1. Cirrhotic changes in the liver. 2. Findings is consistent with portal  venous hypertension, splenorenal, and mesenteric shunting. 3. Nonspecific stranding in the mesentery may be related to mesenteric collaterals but appear stable. 4. No evidence for bowel obstruction or urinary tract obstruction. 5. Colonic diverticulosis without acute diverticulitis. 6. Stable compression fracture of T11. Electronically Signed   By: Norva Pavlov M.D.   On: 03/25/2017 18:12    Medications: I have reviewed the patient's current medications.  Assessment/Plan: 1. Intractable nausea. He's been given antiemetics. He's had no vomiting since admission. We'll go ahead and start him on clear liquid diet and advance as tolerated. Antiemetics as needed. Change Protonix to by mouth. 2. Potential GI bleed. Again patient is guaiac negative and hemoglobin is stable. No evidence of bleeding since admission. He was assessed by gastroenterology who felt that no further workup was necessary at this time. Especially since he had no varices on his last endoscopy. We'll continue PPI. Emphasized with patient about stopping alcohol as this could cause gastritis.  3. Alcohol abuse. Patient said he hasn't drank anything in 3 days with S1 is nausea and vomiting started. Again advised him on cessation  LOS: 0 days   Gracelyn Nurse 03/26/2017, 1:33 PM

## 2017-03-26 NOTE — ED Notes (Addendum)
Pt states "bright red blood" in emesis.  Pt states his last intake of alcohol was yesterday.

## 2017-03-26 NOTE — Progress Notes (Signed)
The patient arrived to the ICU hemodynamically stable without evidence of active bleeding. I discussed with Dr. Servando SnareWohl who indicates that the patient has no esophageal varices based on previous endoscopy I discussed with Dr. Letitia LibraJohnston Transfer orders to MedSurg floor entered PCCM will not see her officially  Billy Fischeravid Gaelen Brager, MD PCCM service Mobile [phone] Pager [phone] 03/26/2017 11:55 AM

## 2017-03-27 DIAGNOSIS — K920 Hematemesis: Secondary | ICD-10-CM | POA: Diagnosis not present

## 2017-03-27 LAB — URINE CULTURE: Culture: NO GROWTH

## 2017-03-27 MED ORDER — DIPHENHYDRAMINE HCL 25 MG PO CAPS
25.0000 mg | ORAL_CAPSULE | Freq: Four times a day (QID) | ORAL | Status: DC | PRN
  Administered 2017-03-27: 25 mg via ORAL
  Filled 2017-03-27: qty 1

## 2017-03-27 NOTE — Care Management CC44 (Signed)
Condition Code 44 Documentation Completed  Patient Details  Name: Howard [name] MRN: [id number] Date of Birth: [birth year]   Condition Code 44 given:  Yes Patient signature on Condition Code 44 notice:  Yes Documentation of 2 MD's agreement:    Code 44 added to claim:       Chapman FitchBOWEN, Hollye Pritt T, RN 03/27/2017, 12:21 PM

## 2017-03-27 NOTE — Discharge Summary (Signed)
Physician Discharge Summary  Patient ID: Howard LinemanJoey A Santarelli MRN: [id number] DOB/AGE: 1981/02/22 36 y.o.  Admit date: 03/25/2017 Discharge date: 03/27/2017  Admission Diagnoses:  Discharge Diagnoses:  Active Problems:   GI bleed   Upper GI bleed   Discharged Condition: stable  Hospital Course:1. Intractable nausea. He was treated with antiemetics. Start on clear liquid diet last night advanced. Currently tolerating diet. Advised him upon discharge to take a light diet for the next day or 2 and advance as tolerated.  2. Potential GI bleed. Again patient is guaiac negative and hemoglobin is stable. No evidence of bleeding no evidence of bleeding during this admission. Last EGD showed no varices. GI evaluated him and his native need for further GI workup.  3. Alcohol abuse. Advised on cessation   Discharge Exam: Blood pressure (!) 145/88, pulse 75, temperature 98.2 F (36.8 C), temperature source Oral, resp. rate 18, height 6\' 3"  (1.905 m), weight (!) 158.1 kg (348 lb 8 oz), SpO2 93 %. General appearance: no distress GI: soft, non-tender; bowel sounds normal; no masses,  no organomegaly  Disposition: 01-Home or Self Care  Discharge Instructions    Diet - low sodium heart healthy    Complete by:  As directed    Increase activity slowly    Complete by:  As directed      Allergies as of 03/27/2017   No Known Allergies     Medication List    TAKE these medications   acamprosate 333 MG tablet Commonly known as:  CAMPRAL Take 2 tablets (666 mg total) by mouth 3 (three) times daily with meals.   albuterol 108 (90 Base) MCG/ACT inhaler Commonly known as:  PROVENTIL HFA;VENTOLIN HFA Inhale 2 puffs into the lungs every 6 (six) hours as needed for wheezing or shortness of breath.   citalopram 20 MG tablet Commonly known as:  CELEXA Take 1 tablet (20 mg total) by mouth every morning.   clonazePAM 0.5 MG tablet Commonly known as:  KLONOPIN Take 1 tablet (0.5 mg total) by mouth 2 (two)  times daily as needed for anxiety.   docusate sodium 100 MG capsule Commonly known as:  COLACE Take 100 mg by mouth daily as needed for mild constipation.   ferrous sulfate 325 (65 FE) MG tablet Take 3 tablets (975 mg total) by mouth every Monday, Wednesday, and Friday. With breakfast   folic acid 1 MG tablet Commonly known as:  FOLVITE Take 1 tablet (1 mg total) by mouth daily.   lactulose 10 GM/15ML solution Commonly known as:  CHRONULAC Take 45 mLs (30 g total) by mouth every 4 (four) hours. What changed:  when to take this   levETIRAcetam 750 MG tablet Commonly known as:  KEPPRA Take 2 tablets (1,500 mg total) by mouth 2 (two) times daily.   losartan 25 MG tablet Commonly known as:  COZAAR Take 1 tablet (25 mg total) by mouth daily.   lurasidone 80 MG Tabs tablet Commonly known as:  LATUDA Take 1 tablet (80 mg total) by mouth daily with supper.   metFORMIN 500 MG tablet Commonly known as:  GLUCOPHAGE TAKE 1 TABLET BY MOUTH 2 TIMES DAILY WITH MEALS What changed:  See the new instructions.   nadolol 20 MG tablet Commonly known as:  CORGARD Take 1 tablet (20 mg total) by mouth daily.   NON FORMULARY   rifaximin 550 MG Tabs tablet Commonly known as:  XIFAXAN Take 1 tablet (550 mg total) by mouth 2 (two) times daily.  spironolactone 50 MG tablet Commonly known as:  ALDACTONE Take 1 tablet (50 mg total) by mouth 2 (two) times daily.   thiamine 100 MG tablet Commonly known as:  VITAMIN B-1 Take 100 mg by mouth daily.   traZODone 100 MG tablet Commonly known as:  DESYREL Take 1 tablet (100 mg total) by mouth at bedtime as needed for sleep.      Follow-up Information    [name], NP Follow up in 3 week(s).   Specialty:  Nurse Practitioner Contact information: [address] [place] [address] [phone]           Signed: Gracelyn Nurse 03/27/2017, 11:17 AM

## 2017-03-27 NOTE — Care Management (Signed)
Lives at home with mom.  Chronic O2 with [employer].  RW and cane in the home.  PCP [facility]. Previously open with [facility] home health.  Patient agreeable to Home Health services.  Referral made to karen with [facility]. Patient states that he is planning on to stop drinking this time.  "i realized I got luck this time, and I am going to stop" RNCM following off.

## 2017-03-27 NOTE — Care Management Obs Status (Signed)
MEDICARE OBSERVATION STATUS NOTIFICATION   Patient Details  Name: Howard LinemanJoey A Mangan MRN: [id number] Date of Birth: November 01, 1980   Medicare Observation Status Notification Given:  Yes    Chapman FitchBOWEN, Calaya Gildner T, RN 03/27/2017, 12:21 PM

## 2017-03-29 ENCOUNTER — Emergency Department
Admission: EM | Admit: 2017-03-29 | Discharge: 2017-03-30 | Disposition: A | Discharge status: Home / Self Care | Payer: Medicare Other | Attending: Emergency Medicine | Admitting: Emergency Medicine

## 2017-03-29 ENCOUNTER — Encounter: Payer: Self-pay | Admitting: Emergency Medicine

## 2017-03-29 ENCOUNTER — Other Ambulatory Visit: Payer: Self-pay

## 2017-03-29 DIAGNOSIS — E119 Type 2 diabetes mellitus without complications: Secondary | ICD-10-CM | POA: Diagnosis not present

## 2017-03-29 DIAGNOSIS — R531 Weakness: Secondary | ICD-10-CM | POA: Insufficient documentation

## 2017-03-29 DIAGNOSIS — I10 Essential (primary) hypertension: Secondary | ICD-10-CM | POA: Insufficient documentation

## 2017-03-29 DIAGNOSIS — G8929 Other chronic pain: Secondary | ICD-10-CM

## 2017-03-29 DIAGNOSIS — J449 Chronic obstructive pulmonary disease, unspecified: Secondary | ICD-10-CM | POA: Insufficient documentation

## 2017-03-29 DIAGNOSIS — R109 Unspecified abdominal pain: Secondary | ICD-10-CM | POA: Diagnosis not present

## 2017-03-29 DIAGNOSIS — Z7984 Long term (current) use of oral hypoglycemic drugs: Secondary | ICD-10-CM | POA: Diagnosis not present

## 2017-03-29 DIAGNOSIS — W010XXA Fall on same level from slipping, tripping and stumbling without subsequent striking against object, initial encounter: Secondary | ICD-10-CM | POA: Diagnosis not present

## 2017-03-29 HISTORY — DX: Unspecified atrial fibrillation: I48.91

## 2017-03-29 LAB — URINALYSIS, COMPLETE (UACMP) WITH MICROSCOPIC
Bilirubin Urine: NEGATIVE
GLUCOSE, UA: NEGATIVE mg/dL
KETONES UR: NEGATIVE mg/dL
Leukocytes, UA: NEGATIVE
NITRITE: NEGATIVE
PH: 7 (ref 5.0–8.0)
Protein, ur: NEGATIVE mg/dL
Specific Gravity, Urine: 1.017 (ref 1.005–1.030)
Squamous Epithelial / LPF: NONE SEEN

## 2017-03-29 LAB — COMPREHENSIVE METABOLIC PANEL
ALBUMIN: 3.6 g/dL (ref 3.5–5.0)
ALK PHOS: 113 U/L (ref 38–126)
ALT: 44 U/L (ref 17–63)
AST: 69 U/L — AB (ref 15–41)
Anion gap: 8 (ref 5–15)
BUN: 11 mg/dL (ref 6–20)
CALCIUM: 9.4 mg/dL (ref 8.9–10.3)
CO2: 29 mmol/L (ref 22–32)
CREATININE: 1.06 mg/dL (ref 0.61–1.24)
Chloride: 102 mmol/L (ref 101–111)
GFR calc Af Amer: >60 mL/min (ref >60)
GFR calc non Af Amer: >60 mL/min (ref >60)
GLUCOSE: 130 mg/dL — AB (ref 65–99)
Potassium: 4.4 mmol/L (ref 3.5–5.1)
SODIUM: 139 mmol/L (ref 135–145)
Total Bilirubin: 0.8 mg/dL (ref 0.3–1.2)
Total Protein: 8.3 g/dL — ABNORMAL HIGH (ref 6.5–8.1)

## 2017-03-29 LAB — CBC
HEMATOCRIT: 37 % — AB (ref 40.0–52.0)
HEMOGLOBIN: 12 g/dL — AB (ref 13.0–18.0)
MCH: 28.5 pg (ref 26.0–34.0)
MCHC: 32.3 g/dL (ref 32.0–36.0)
MCV: 88.1 fL (ref 80.0–100.0)
Platelets: 117 10*3/uL — ABNORMAL LOW (ref 150–440)
RBC: 4.2 MIL/uL — AB (ref 4.40–5.90)
RDW: 21.6 % — ABNORMAL HIGH (ref 11.5–14.5)
WBC: 6.8 10*3/uL (ref 3.8–10.6)

## 2017-03-29 LAB — TROPONIN I: Troponin I: <0.03 ng/mL (ref <0.03)

## 2017-03-29 NOTE — ED Triage Notes (Signed)
Pt presents to ED with c/o bloody emesis X2 and falls yesterday and today. Pt states he was recently discharge from this facility after 3 day admission for GI bleeding and states he has been having to use his walker at home to get around. Pt states he has been unsteady on his feet and he tripped over his O2 tubing and lost his balance while attempting to ambulate without his walker. Pt states after his fall today he felt nauseated and noticed blood clots in his vomit. Pt states he has noticed pain in his right upper abd since his fall; pain is reproducible with palpation.

## 2017-03-30 NOTE — Discharge Instructions (Signed)
Your workup in the Emergency Department today was reassuring.  We did not find any specific abnormalities.  We recommend you drink plenty of fluids, take your regular medications and/or any new ones prescribed today, and follow up with the doctor(s) listed in these documents as recommended.  Return to the Emergency Department if you develop new or worsening symptoms that concern you.  

## 2017-03-30 NOTE — ED Notes (Signed)
D/c instructions reviewed with pt and mother by April, RN. This RN d/c pt from unit.    VS stable and pain controlled per pt.  Pt. In NAD at time of d/c and denies further concerns regarding this visit. Pt. Stable at the time of departure from the unit, departing unit by the safest and most appropriate manner per that pt condition and limitations (wheelchair by this RN to pt mother car, helped into car by this RN). Pt advised to return to the ED at any time for emergent concerns, or for new/worsening symptoms.

## 2017-03-30 NOTE — ED Provider Notes (Signed)
[facility] Emergency Department Provider Note  ____________________________________________   First MD Initiated Contact with Patient 03/30/17 0138     (approximate)  I have reviewed the triage vital signs and the nursing notes.   HISTORY  Chief Complaint Fall and Hematemesis    HPI Howard [name] is a 36 y.o. male who is well-known to this emergency department with 20 ED visits in the last 6 months and he was recently admitted for reported hematemesis in the setting of chronic alcohol abuse.  He was discharged about 3 or 4 days ago from the hospital after having a reassuring GI workup.  He has been scoped in the past with no evidence of variceal bleeding the last time he was scoped.  At no point did he develop heme positive stool during the last day.  He has chronic minimal tenderness to palpation of the abdomen and a cirrhotic liver.  No intervention was recommended at the time he went home.  He presents todayreporting that he has been feeling weak since he went home and has to walk with a walker.  He has been unsteady on his feet (which is also chronic according to the EMR) and he tripped over his oxygen tubing and fell.  He did not strike his head but after he fell he reports that he had an episode of vomiting and believes that he saw blood clots in it so he felt he should be evaluated.  He denies loss of consciousness, chest pain, acute shortness of breath greater than his chronic respiratory difficulties, and any greater abdominal pain than usual.  He has not seen any gross hematuria.  He describes the symptoms as moderate.  Nothing in particular makes the patient's symptoms better nor worse.     Past Medical History:  Diagnosis Date  . Alcoholic cirrhosis of liver with ascites (HCC)   . Alcoholism (HCC)   . Anemia   . Atrial fibrillation (HCC)   . COPD (chronic obstructive pulmonary disease) (HCC)   . Depression   . Diabetes mellitus, type II (HCC)   .  Esophageal varices (HCC)   . Heart disease    irregular heart beat (palpitations) and heart murmur  . Hyperlipemia   . Hypertension   . Liver disease   . Multiple thyroid nodules   . Portal hypertensive gastropathy (HCC)   . Schizophrenia (HCC)   . Seizures [facility])     Patient Active Problem List   Diagnosis Date Noted  . Upper GI bleed   . Iron deficiency anemia due to chronic blood loss 02/16/2017  . Nausea 01/18/2017  . Respiratory failure with hypoxia (HCC) 12/14/2016  . Seizures (HCC) 12/12/2016  . DNR (do not resuscitate) discussion 08/11/2016  . Muscle weakness (generalized)   . GI bleed 08/07/2016  . OSA (obstructive sleep apnea) 04/29/2016  . Anemia 04/29/2016  . Thrombocytopenia (HCC) 04/29/2016  . Coagulopathy (HCC) 04/29/2016  . Obesity 04/29/2016  . Controlled type 2 diabetes mellitus without complication (HCC) 01/15/2016  . Essential (primary) hypertension 12/11/2015  . Pure hypercholesterolemia 12/11/2015  . Hepatic encephalopathy (HCC) 10/16/2015  . 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 Elevated transaminase level 07/04/2015  . Alcohol abuse 05/31/2015  . Paranoid schizophrenia (HCC) 03/20/2015    Past Surgical History:  Procedure Laterality Date  . ESOPHAGOGASTRODUODENOSCOPY N/A 10/05/2015   Procedure: ESOPHAGOGASTRODUODENOSCOPY (EGD);  Surgeon: Elnita Maxwell, MD;  Location: [facility] ENDOSCOPY;  Service: Endoscopy;  Laterality: N/A;  . ESOPHAGOGASTRODUODENOSCOPY (EGD)  WITH PROPOFOL N/A 08/03/2015   Procedure: ESOPHAGOGASTRODUODENOSCOPY (EGD) WITH PROPOFOL;  Surgeon: Elnita MaxwellMatthew Gordon Rein, MD;  [facility];  Service: Endoscopy;  Laterality: N/A;  . ESOPHAGOGASTRODUODENOSCOPY (EGD) WITH PROPOFOL N/A 08/31/2015   Procedure: ESOPHAGOGASTRODUODENOSCOPY (EGD) WITH PROPOFOL;  Surgeon: Elnita MaxwellMatthew Gordon Rein, MD;  [facility];  Service: Endoscopy;  Laterality: N/A;  . ESOPHAGOGASTRODUODENOSCOPY (EGD) WITH PROPOFOL N/A  04/04/2016   Procedure: ESOPHAGOGASTRODUODENOSCOPY (EGD) WITH PROPOFOL;  Surgeon: Scot Junobert T Elliott, MD;  [facility];  Service: Endoscopy;  Laterality: N/A;  . ESOPHAGOGASTRODUODENOSCOPY (EGD) WITH PROPOFOL N/A 11/13/2016   Procedure: ESOPHAGOGASTRODUODENOSCOPY (EGD) WITH PROPOFOL;  Surgeon: Wyline MoodKiran Anna, MD;  Location: [facility] ENDOSCOPY;  Service: Endoscopy;  Laterality: N/A;  . NO PAST SURGERIES      Prior to Admission medications   Medication Sig Start Date End Date Taking? Authorizing Provider  acamprosate (CAMPRAL) 333 MG tablet Take 2 tablets (666 mg total) by mouth 3 (three) times daily with meals. 03/24/17   [name], Jackquline DenmarkJohn T, MD  albuterol (PROVENTIL HFA;VENTOLIN HFA) 108 (90 Base) MCG/ACT inhaler Inhale 2 puffs into the lungs every 6 (six) hours as needed for wheezing or shortness of breath. 10/08/16   Shaune Pollackhen, Qing, MD  citalopram (CELEXA) 20 MG tablet Take 1 tablet (20 mg total) by mouth every morning. Patient not taking: Reported on 03/27/2017 03/24/17   [name], Jackquline DenmarkJohn T, MD  clonazePAM (KLONOPIN) 0.5 MG tablet Take 1 tablet (0.5 mg total) by mouth 2 (two) times daily as needed for anxiety. Patient not taking: Reported on 03/27/2017 03/24/17   [name], Jackquline DenmarkJohn T, MD  docusate sodium (COLACE) 100 MG capsule Take 100 mg by mouth daily as needed for mild constipation.    [provider]  ferrous sulfate 325 (65 FE) MG tablet Take 3 tablets (975 mg total) by mouth every Monday, Wednesday, and Friday. With breakfast 01/21/17   Galen ManilaKennedy, [name], NP  folic acid (FOLVITE) 1 MG tablet Take 1 tablet (1 mg total) by mouth daily. 12/19/16   Gouru, Deanna ArtisAruna, MD  lactulose (CHRONULAC) 10 GM/15ML solution Take 45 mLs (30 g total) by mouth every 4 (four) hours. Patient taking differently: Take 30 g by mouth 4 (four) times daily.  07/09/16   Katharina CaperVaickute, Rima, MD  levETIRAcetam (KEPPRA) 750 MG tablet Take 2 tablets (1,500 mg total) by mouth 2 (two) times daily. 12/11/16 03/27/17  Merrily Brittleifenbark, Neil, MD  losartan  (COZAAR) 25 MG tablet Take 1 tablet (25 mg total) by mouth daily. 03/11/17   Galen ManilaKennedy, [name], NP  lurasidone (LATUDA) 80 MG TABS tablet Take 1 tablet (80 mg total) by mouth daily with supper. 03/24/17   [name], Jackquline DenmarkJohn T, MD  metFORMIN (GLUCOPHAGE) 500 MG tablet TAKE 1 TABLET BY MOUTH 2 TIMES DAILY WITH MEALS 03/26/17   Galen ManilaKennedy, [name], NP  nadolol (CORGARD) 20 MG tablet Take 1 tablet (20 mg total) by mouth daily. 03/11/17   Galen ManilaKennedy, [name], NP  NON FORMULARY     [provider]  rifaximin (XIFAXAN) 550 MG TABS tablet Take 1 tablet (550 mg total) by mouth 2 (two) times daily. 07/09/16   Katharina CaperVaickute, Rima, MD  spironolactone (ALDACTONE) 50 MG tablet Take 1 tablet (50 mg total) by mouth 2 (two) times daily. 03/02/17   Galen ManilaKennedy, [name], NP  thiamine (VITAMIN B-1) 100 MG tablet Take 100 mg by mouth daily.    [provider]  traZODone (DESYREL) 100 MG tablet Take 1 tablet (100 mg total) by mouth at bedtime as needed for sleep. 03/24/17  [name], [name], MD    Allergies Patient has no known allergies.  Family History  Problem Relation Age of Onset  . Heart disease Mother   . Hypertension Mother   . Hyperlipidemia Mother   . Stroke Father   . Heart attack Father   . Hypertension Father   . Heart disease Father   . Alcohol abuse Father   . Heart disease Brother     Social History Social History  Substance Use Topics  . Smoking status: Never Smoker  . Smokeless tobacco: Never Used  . Alcohol use 3.6 oz/week    6 Cans of beer per week     Comment: "one quart" 2-3 times per week    Review of Systems Constitutional: No fever/chills.  Chronically unsteady gait and generalized weakness Eyes: No visual changes. ENT: No sore throat. Cardiovascular: Denies chest pain. Respiratory: Chronic shortness of breath, no acute difficulties Gastrointestinal: Reportedly had one episode of hematemesis at home, released some blood clots mixed in with the vomit.  Chronic  abdominal pain Genitourinary: Negative for dysuria. Musculoskeletal: Negative for neck pain.  Negative for back pain. Integumentary: Negative for rash. Neurological: Negative for headaches, focal weakness or numbness.  Chronically unsteady gait   ____________________________________________   PHYSICAL EXAM:  VITAL SIGNS: ED Triage Vitals  Enc Vitals Group     BP 03/29/17 2010 (!) 153/80     Pulse Rate 03/29/17 2010 76     Resp 03/29/17 2010 20     Temp 03/29/17 2010 97.8 F (36.6 C)     Temp Source 03/29/17 2010 Oral     SpO2 03/29/17 2010 95 %     Weight 03/29/17 2011 (!) 157.9 kg (348 lb)     Height 03/29/17 2011 1.905 m (6\' 3" )     Head Circumference --      Peak Flow --      Pain Score 03/29/17 2010 7     Pain Loc --      Pain Edu? --      Excl. in GC? --     Constitutional: Alert and oriented. Appears somewhat chronically ill in spite of his relatively young age but in no acute distress.  On chronic oxygen Eyes: Conjunctivae are normal.  Head: Atraumatic. Nose: No congestion/rhinnorhea. Mouth/Throat: Mucous membranes are moist. Neck: No stridor.  No meningeal signs.   Cardiovascular: Normal rate, regular rhythm. Good peripheral circulation. Grossly normal heart sounds. Respiratory: Normal respiratory effort.  No retractions. Lungs CTAB. Gastrointestinal: Obese.  Soft with minimal diffuse tenderness throughout the abdomen.  No rebound nor guarding.  Patient refused rectal exam Musculoskeletal: No lower extremity tenderness nor edema. No gross deformities of extremities. Neurologic:  Normal speech and language. No gross focal neurologic deficits are appreciated.  Skin:  Skin is warm, dry and intact. No rash noted.   ____________________________________________   LABS (all labs ordered are listed, but only abnormal results are displayed)  Labs Reviewed  CBC - Abnormal; Notable for the following:       Result Value   RBC 4.20 (*)    Hemoglobin 12.0 (*)    HCT  37.0 (*)    RDW 21.6 (*)    Platelets 117 (*)    All other components within normal limits  COMPREHENSIVE METABOLIC PANEL - Abnormal; Notable for the following:    Glucose, Bld 130 (*)    Total Protein 8.3 (*)    AST 69 (*)    All other components within normal limits  URINALYSIS, COMPLETE (UACMP) WITH MICROSCOPIC - Abnormal; Notable for the following:    Color, Urine YELLOW (*)    APPearance CLEAR (*)    Hgb urine dipstick MODERATE (*)    Bacteria, UA RARE (*)    All other components within normal limits  TROPONIN I   ____________________________________________  EKG  ED ECG REPORT I, Keevin Panebianco, the attending physician, personally viewed and interpreted this ECG.  Date: 03/29/2017 EKG Time: 20:23 Rate: 77 Rhythm: normal sinus rhythm QRS Axis: normal Intervals: normal ST/T Wave abnormalities: normal Narrative Interpretation: unremarkable  ____________________________________________  RADIOLOGY   No results found.  ____________________________________________   PROCEDURES  Critical Care performed: No   Procedure(s) performed:   Procedures   ____________________________________________   INITIAL IMPRESSION / ASSESSMENT AND PLAN / ED COURSE  Pertinent labs & imaging results that were available during my care of the patient were reviewed by me and considered in my medical decision making (see chart for details).  The patient appears to be at his baseline.  His labs are all reassuring and his hemoglobin is up to 12.  I reviewed the gastroenterology consult note from his recent inpatient stay and verify the information as listed in the history of present illness including the fact that he continue to have heme-negative stools in spite of the report of hematemesis.  He had no visual varices the last time he was scoped and he does have a GI doctor with whom he can follow-up.  His workup was reassuring today.  I told him we would do a rectal exam but he does  not want to have the exam done and given that he has been negative multiple times in the past I think that is appropriate.  He has some hematuria but that is also chronic and he had CT renal stone study and head CT performed just 5 days ago.  No real indication to repeat imaging.  The patient is hemodynamically stable and in no acute distress and appropriate for outpatient follow-up.  I discussed this with the patient and his mother and they understand and agree with the plan.      ____________________________________________  FINAL CLINICAL IMPRESSION(S) / ED DIAGNOSES  Final diagnoses:  Generalized weakness  Chronic abdominal pain     MEDICATIONS GIVEN DURING THIS VISIT:  Medications - No data to display   NEW OUTPATIENT MEDICATIONS STARTED DURING THIS VISIT:  New Prescriptions   No medications on file    Modified Medications   No medications on file    Discontinued Medications   No medications on file     Note:  This document was prepared using Dragon voice recognition software and may include unintentional dictation errors.    Loleta Rose, MD 03/30/17 [phone]

## 2017-03-30 NOTE — ED Notes (Signed)
Pt states he does not like to walk with his walker "because people look at me funny". Pt states today and yesterday he tripped while ambulating without walker. Pt states he struck his forehead yesterday and struck his chest today when he fell. Pt states he also had two episodes of "Bloody" emesis after falling today. No obvious trauma noted. Pt states today when he fell his mother was not able to get him up and EMS was called to assist pt from floor.

## 2017-04-03 ENCOUNTER — Other Ambulatory Visit: Payer: Self-pay

## 2017-04-03 ENCOUNTER — Emergency Department
Admission: EM | Admit: 2017-04-03 | Discharge: 2017-04-03 | Disposition: A | Discharge status: Home / Self Care | Payer: Medicare Other | Source: Home / Self Care | Attending: Emergency Medicine | Admitting: Emergency Medicine

## 2017-04-03 ENCOUNTER — Encounter: Payer: Self-pay | Admitting: Emergency Medicine

## 2017-04-03 ENCOUNTER — Emergency Department: Payer: Medicare Other

## 2017-04-03 DIAGNOSIS — Z7984 Long term (current) use of oral hypoglycemic drugs: Secondary | ICD-10-CM

## 2017-04-03 DIAGNOSIS — I4891 Unspecified atrial fibrillation: Secondary | ICD-10-CM | POA: Insufficient documentation

## 2017-04-03 DIAGNOSIS — E119 Type 2 diabetes mellitus without complications: Secondary | ICD-10-CM

## 2017-04-03 DIAGNOSIS — K2970 Gastritis, unspecified, without bleeding: Secondary | ICD-10-CM | POA: Insufficient documentation

## 2017-04-03 DIAGNOSIS — Z79899 Other long term (current) drug therapy: Secondary | ICD-10-CM | POA: Insufficient documentation

## 2017-04-03 DIAGNOSIS — R0789 Other chest pain: Secondary | ICD-10-CM | POA: Insufficient documentation

## 2017-04-03 DIAGNOSIS — I10 Essential (primary) hypertension: Secondary | ICD-10-CM

## 2017-04-03 DIAGNOSIS — J449 Chronic obstructive pulmonary disease, unspecified: Secondary | ICD-10-CM | POA: Insufficient documentation

## 2017-04-03 DIAGNOSIS — K920 Hematemesis: Secondary | ICD-10-CM | POA: Diagnosis not present

## 2017-04-03 LAB — COMPREHENSIVE METABOLIC PANEL
ALBUMIN: 3.7 g/dL (ref 3.5–5.0)
ALT: 56 U/L (ref 17–63)
ANION GAP: 8 (ref 5–15)
AST: 93 U/L — ABNORMAL HIGH (ref 15–41)
Alkaline Phosphatase: 106 U/L (ref 38–126)
BUN: 15 mg/dL (ref 6–20)
CHLORIDE: 102 mmol/L (ref 101–111)
CO2: 27 mmol/L (ref 22–32)
Calcium: 9.5 mg/dL (ref 8.9–10.3)
Creatinine, Ser: 1.2 mg/dL (ref 0.61–1.24)
GFR calc non Af Amer: >60 mL/min (ref >60)
GLUCOSE: 109 mg/dL — AB (ref 65–99)
POTASSIUM: 4.3 mmol/L (ref 3.5–5.1)
SODIUM: 137 mmol/L (ref 135–145)
Total Bilirubin: 1.1 mg/dL (ref 0.3–1.2)
Total Protein: 8.2 g/dL — ABNORMAL HIGH (ref 6.5–8.1)

## 2017-04-03 LAB — TROPONIN I

## 2017-04-03 LAB — CBC
HEMATOCRIT: 37.1 % — AB (ref 40.0–52.0)
Hemoglobin: 12.1 g/dL — ABNORMAL LOW (ref 13.0–18.0)
MCH: 29 pg (ref 26.0–34.0)
MCHC: 32.7 g/dL (ref 32.0–36.0)
MCV: 88.7 fL (ref 80.0–100.0)
PLATELETS: 143 10*3/uL — AB (ref 150–440)
RBC: 4.18 MIL/uL — ABNORMAL LOW (ref 4.40–5.90)
RDW: 20.5 % — AB (ref 11.5–14.5)
WBC: 7.7 10*3/uL (ref 3.8–10.6)

## 2017-04-03 LAB — ETHANOL: Alcohol, Ethyl (B): <5 mg/dL (ref <5)

## 2017-04-03 LAB — LIPASE, BLOOD: Lipase: 34 U/L (ref 11–51)

## 2017-04-03 MED ORDER — SODIUM CHLORIDE 0.9 % IV BOLUS (SEPSIS)
1000.0000 mL | Freq: Once | INTRAVENOUS | Status: AC
  Administered 2017-04-03: 1000 mL via INTRAVENOUS

## 2017-04-03 MED ORDER — PANTOPRAZOLE SODIUM 40 MG IV SOLR
40.0000 mg | Freq: Once | INTRAVENOUS | Status: AC
  Administered 2017-04-03: 40 mg via INTRAVENOUS
  Filled 2017-04-03: qty 40

## 2017-04-03 MED ORDER — FAMOTIDINE IN NACL 20-0.9 MG/50ML-% IV SOLN
20.0000 mg | Freq: Once | INTRAVENOUS | Status: AC
  Administered 2017-04-03: 20 mg via INTRAVENOUS
  Filled 2017-04-03 (×2): qty 50

## 2017-04-03 MED ORDER — ESOMEPRAZOLE MAGNESIUM 40 MG PO CPDR: 40.0000 mg | DELAYED_RELEASE_CAPSULE | Freq: Every day | ORAL | 0 refills | Status: DC

## 2017-04-03 MED ORDER — ONDANSETRON HCL 4 MG/2ML IJ SOLN
4.0000 mg | Freq: Once | INTRAMUSCULAR | Status: AC
  Administered 2017-04-03: 4 mg via INTRAVENOUS
  Filled 2017-04-03: qty 2

## 2017-04-03 MED ORDER — GI COCKTAIL ~~LOC~~
30.0000 mL | Freq: Once | ORAL | Status: AC
  Administered 2017-04-03: 30 mL via ORAL
  Filled 2017-04-03: qty 30

## 2017-04-03 NOTE — ED Notes (Signed)
Pt states "my lips are numb, I feel better".

## 2017-04-03 NOTE — ED Provider Notes (Signed)
[facility]-EMERGENCY DEPARTMENT Provider Note   CSN: [id number] Arrival date & time: 04/03/17  1812     History   Chief Complaint Chief Complaint  Patient presents with  . Chest Pain    HPI Howard [name] is a 36 y.o. male history of cirrhosis, chronic alcoholic, COPD on 2 L nasal cannula, diabetes, hypertension here presenting with chest pain, epigastric pain. Patient states that he was eating at [employer] and soon afterwards he had some chest pain and epigastric pain. Also had nausea but no vomiting. Patient did say that the pain did radiate to the left arm. Patient is a chronic alcoholic and last alcoholic use was 2 days ago. Patient has been seen in the ED multiple times and most recently last week for some hematemesis. Patient was admitted recently and had  EGD in Feb that showed no varices.  He denies any vomiting today. Patient has no known CAD.  The history is provided by the patient.    Past Medical History:  Diagnosis Date  . Alcoholic cirrhosis of liver with ascites (HCC)   . Alcoholism (HCC)   . Anemia   . Atrial fibrillation (HCC)   . COPD (chronic obstructive pulmonary disease) (HCC)   . Depression   . Diabetes mellitus, type II (HCC)   . Esophageal varices (HCC)   . Heart disease    irregular heart beat (palpitations) and heart murmur  . Hyperlipemia   . Hypertension   . Liver disease   . Multiple thyroid nodules   . Portal hypertensive gastropathy (HCC)   . Schizophrenia (HCC)   . Seizures [facility])     Patient Active Problem List   Diagnosis Date Noted  . Upper GI bleed   . Iron deficiency anemia due to chronic blood loss 02/16/2017  . Nausea 01/18/2017  . Respiratory failure with hypoxia (HCC) 12/14/2016  . Seizures (HCC) 12/12/2016  . DNR (do not resuscitate) discussion 08/11/2016  . Muscle weakness (generalized)   . GI bleed 08/07/2016  . OSA (obstructive sleep apnea) 04/29/2016  . Anemia 04/29/2016  . Thrombocytopenia (HCC) 04/29/2016  . Coagulopathy (HCC)  04/29/2016  . Obesity 04/29/2016  . Controlled type 2 diabetes mellitus without complication (HCC) 01/15/2016  . Essential (primary) hypertension 12/11/2015  . Pure hypercholesterolemia 12/11/2015  . Hepatic encephalopathy (HCC) 10/16/2015  . 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 Elevated transaminase level 07/04/2015  . Alcohol abuse 05/31/2015  . Paranoid schizophrenia (HCC) 03/20/2015    Past Surgical History:  Procedure Laterality Date  . ESOPHAGOGASTRODUODENOSCOPY N/A 10/05/2015   Procedure: ESOPHAGOGASTRODUODENOSCOPY (EGD);  Surgeon: Elnita Maxwell, MD;  Location: [facility] ENDOSCOPY;  Service: Endoscopy;  Laterality: N/A;  . ESOPHAGOGASTRODUODENOSCOPY (EGD) WITH PROPOFOL N/A 08/03/2015   Procedure: ESOPHAGOGASTRODUODENOSCOPY (EGD) WITH PROPOFOL;  Surgeon: Elnita Maxwell, MD;  Location: [facility] ENDOSCOPY;  Service: Endoscopy;  Laterality: N/A;  . ESOPHAGOGASTRODUODENOSCOPY (EGD) WITH PROPOFOL N/A 08/31/2015   Procedure: ESOPHAGOGASTRODUODENOSCOPY (EGD) WITH PROPOFOL;  Surgeon: Elnita Maxwell, MD;  Location: [facility] ENDOSCOPY;  Service: Endoscopy;  Laterality: N/A;  . ESOPHAGOGASTRODUODENOSCOPY (EGD) WITH PROPOFOL N/A 04/04/2016   Procedure: ESOPHAGOGASTRODUODENOSCOPY (EGD) WITH PROPOFOL;  Surgeon: Scot Jun, MD;  Location: [facility] ENDOSCOPY;  Service: Endoscopy;  Laterality: N/A;  . ESOPHAGOGASTRODUODENOSCOPY (EGD) WITH PROPOFOL N/A 11/13/2016   Procedure: ESOPHAGOGASTRODUODENOSCOPY (EGD) WITH PROPOFOL;  Surgeon: Wyline Mood, MD;  Location: [facility] ENDOSCOPY;  Service: Endoscopy;  Laterality: N/A;  . NO PAST SURGERIES         Home Medications  Prior to Admission medications   Medication Sig Start Date End Date Taking? Authorizing Provider  acamprosate (CAMPRAL) 333 MG tablet Take 2 tablets (666 mg total) by mouth 3 (three) times daily with meals. 03/24/17  Yes [name], [name], MD  ferrous sulfate 325 (65 FE) MG tablet Take 3 tablets (975 mg total) by  mouth every Monday, Wednesday, and Friday. With breakfast 01/21/17  Yes [name], NP  folic acid (FOLVITE) 1 MG tablet Take 1 tablet (1 mg total) by mouth daily. 12/19/16  Yes Gouru, Deanna Artis, MD  lactulose (CHRONULAC) 10 GM/15ML solution Take 45 mLs (30 g total) by mouth every 4 (four) hours. Patient taking differently: Take 30 g by mouth 4 (four) times daily.  07/09/16  Yes Katharina Caper, MD  losartan (COZAAR) 25 MG tablet Take 1 tablet (25 mg total) by mouth daily. 03/11/17  Yes [name], NP  lurasidone (LATUDA) 80 MG TABS tablet Take 1 tablet (80 mg total) by mouth daily with supper. 03/24/17  Yes [name], [name], MD  metFORMIN (GLUCOPHAGE) 500 MG tablet TAKE 1 TABLET BY MOUTH 2 TIMES DAILY WITH MEALS 03/26/17  Yes [name], NP  nadolol (CORGARD) 20 MG tablet Take 1 tablet (20 mg total) by mouth daily. 03/11/17  Yes [name], NP  NON FORMULARY    Yes [provider]  rifaximin (XIFAXAN) 550 MG TABS tablet Take 1 tablet (550 mg total) by mouth 2 (two) times daily. 07/09/16  Yes Katharina Caper, MD  spironolactone (ALDACTONE) 50 MG tablet Take 1 tablet (50 mg total) by mouth 2 (two) times daily. 03/02/17  Yes [name], NP  thiamine (VITAMIN B-1) 100 MG tablet Take 100 mg by mouth daily.   Yes [provider]  traZODone (DESYREL) 100 MG tablet Take 1 tablet (100 mg total) by mouth at bedtime as needed for sleep. 03/24/17  Yes [name], [name], MD  albuterol (PROVENTIL HFA;VENTOLIN HFA) 108 (90 Base) MCG/ACT inhaler Inhale 2 puffs into the lungs every 6 (six) hours as needed for wheezing or shortness of breath. 10/08/16   Shaune Pollack, MD  citalopram (CELEXA) 20 MG tablet Take 1 tablet (20 mg total) by mouth every morning. Patient not taking: Reported on 03/27/2017 03/24/17   [name], [name], MD  clonazePAM (KLONOPIN) 0.5 MG tablet Take 1 tablet (0.5 mg total) by mouth 2 (two) times daily as needed for anxiety. Patient not taking: Reported  on 03/27/2017 03/24/17   [name], [name], MD  docusate sodium (COLACE) 100 MG capsule Take 100 mg by mouth daily as needed for mild constipation.    [provider]  levETIRAcetam (KEPPRA) 750 MG tablet Take 2 tablets (1,500 mg total) by mouth 2 (two) times daily. 12/11/16 03/27/17  Merrily Brittle, MD    Family History Family History  Problem Relation Age of Onset  . Heart disease Mother   . Hypertension Mother   . Hyperlipidemia Mother   . Stroke Father   . Heart attack Father   . Hypertension Father   . Heart disease Father   . Alcohol abuse Father   . Heart disease Brother     Social History Social History  Substance Use Topics  . Smoking status: Never Smoker  . Smokeless tobacco: Never Used  . Alcohol use 3.6 oz/week    6 Cans of beer per week     Comment: "one quart" 2-3 times per week     Allergies   Tramadol   Review  of Systems Review of Systems  Cardiovascular: Positive for chest pain.  All other systems reviewed and are negative.    Physical Exam Updated Vital Signs BP 137/68   Pulse 66   Temp 98 F (36.7 C) (Oral)   Resp 18   Ht 6\' 3"  (1.905 m)   Wt (!) 157.9 kg (348 lb)   SpO2 92%   BMI 43.50 kg/m   Physical Exam  Constitutional:  Older than stated age   HENT:  Head: Normocephalic.  MM slightly dry   Eyes: Pupils are equal, round, and reactive to light. Conjunctivae and EOM are normal.  Neck: Normal range of motion. Neck supple.  Cardiovascular: Normal rate, regular rhythm and normal heart sounds.   Pulmonary/Chest: Effort normal and breath sounds normal. No respiratory distress. He has no wheezes. He has no rales.  Abdominal: Soft. Bowel sounds are normal. He exhibits no distension. There is no tenderness.  + fluid wave (chronic cirrhosis)   Musculoskeletal: Normal range of motion. He exhibits no edema.  Neurological: He is alert.  Skin: Skin is warm.  Psychiatric: He has a normal mood and affect.  Nursing note and vitals  reviewed.    ED Treatments / Results  Labs (all labs ordered are listed, but only abnormal results are displayed) Labs Reviewed  CBC - Abnormal; Notable for the following:       Result Value   RBC 4.18 (*)    Hemoglobin 12.1 (*)    HCT 37.1 (*)    RDW 20.5 (*)    Platelets 143 (*)    All other components within normal limits  COMPREHENSIVE METABOLIC PANEL - Abnormal; Notable for the following:    Glucose, Bld 109 (*)    Total Protein 8.2 (*)    AST 93 (*)    All other components within normal limits  TROPONIN I  LIPASE, BLOOD  ETHANOL    EKG  EKG Interpretation None      ED ECG REPORT I, Richardean Canal, the attending physician, personally viewed and interpreted this ECG.   Date: 04/03/2017  EKG Time: 18:21 pm  Rate: 75  Rhythm: normal EKG, normal sinus rhythm  Axis: normal  Intervals:none  ST&T Change: none    Radiology Dg Chest 2 View  Result Date: 04/03/2017 CLINICAL DATA:  Chest pain. EXAM: CHEST  2 VIEW COMPARISON:  01/18/2017 chest radiograph FINDINGS: Stable heart size and mediastinal contours are within normal limits. Linear opacities at left lung base compatible with minor atelectasis. Pulmonary venous hypertension. No consolidation, effusion, or pneumothorax. Stable mild anterior loss of height of T12 vertebral body. No acute osseous abnormality is evident. IMPRESSION: Pulmonary venous hypertension and minor left lung base atelectasis. Electronically Signed   By: Mitzi Hansen M.D.   On: 04/03/2017 18:48    Procedures Procedures (including critical care time)  Medications Ordered in ED Medications  famotidine (PEPCID) IVPB 20 mg premix (20 mg Intravenous New Bag/Given 04/03/17 1938)  gi cocktail (Maalox,Lidocaine,Donnatal) (30 mLs Oral Given 04/03/17 1921)  ondansetron (ZOFRAN) injection 4 mg (4 mg Intravenous Given 04/03/17 1921)  sodium chloride 0.9 % bolus 1,000 mL (1,000 mLs Intravenous New Bag/Given 04/03/17 1921)  pantoprazole (PROTONIX)  injection 40 mg (40 mg Intravenous Given 04/03/17 1921)     Initial Impression / Assessment and Plan / ED Course  I have reviewed the triage vital signs and the nursing notes.  Pertinent labs & imaging results that were available during my care of the patient were reviewed by  me and considered in my medical decision making (see chart for details).     Lain A [name] is a 36 y.o. male here with chest pain after eating. Likely gastritis. Patient has hx of cirrhosis but no varices on EGD. Will get labs, lipase, trop x 2. Will give GI cocktail, pepcid, protonix.   8:13 PM Pain free currently after GI cocktail. Labs at baseline. CXR stable. Trop x 1.   8:28 PM Second trop due at 10 pm. If neg, anticipate dc home with nexium. Signed out to Dr. Lenard LancePaduchowski in the ED.    Final Clinical Impressions(s) / ED Diagnoses   Final diagnoses:  None    New Prescriptions New Prescriptions   No medications on file     Charlynne PanderYao, Candida Vetter Hsienta, MD 04/03/17 2029

## 2017-04-03 NOTE — ED Notes (Signed)
Pt states chest pain starting to ease off.

## 2017-04-03 NOTE — ED Notes (Signed)
Pt assisted with urinal

## 2017-04-03 NOTE — ED Provider Notes (Signed)
-----------------------------------------   10:18 PM on 04/03/2017 -----------------------------------------  Repeat troponin is negative. Patient will be discharged home per Dr. Ezequiel EssexYao's instructions. Patient will be discharged with Nexium. I discussed the plan of care with the patient/family, and they are agreeable.   Minna AntisPaduchowski, Trayonna Bachmeier, MD 04/03/17 2219

## 2017-04-03 NOTE — ED Notes (Signed)
Pt watching tv in in no acute distress. Call bell at right side.

## 2017-04-03 NOTE — Discharge Instructions (Addendum)
Take nexium daily. See your GI doctor for follow up   Avoid spicy food or fatty foods and avoid drinking alcohol.   See your primary care doctor  Return to ER if you have worse chest pain, abdominal pain, vomiting blood, fever.

## 2017-04-03 NOTE — ED Notes (Signed)
[place]

## 2017-04-03 NOTE — ED Triage Notes (Signed)
Pt in via POV with complaints of left side chest pain radiating down into left arm.  Pt diaphoretic and nauseous upon arrival.  Pt on 2L nasal cannula at baseline.  Vitals WDL.

## 2017-04-03 NOTE — ED Notes (Signed)
Patient transported to X-ray 

## 2017-04-05 ENCOUNTER — Inpatient Hospital Stay
Admission: EM | Admit: 2017-04-05 | Discharge: 2017-04-07 | DRG: 378 | Disposition: A | Discharge status: Home / Self Care | Payer: Medicare Other | Attending: Internal Medicine | Admitting: Internal Medicine

## 2017-04-05 ENCOUNTER — Encounter: Payer: Self-pay | Admitting: Emergency Medicine

## 2017-04-05 DIAGNOSIS — R569 Unspecified convulsions: Secondary | ICD-10-CM | POA: Diagnosis present

## 2017-04-05 DIAGNOSIS — E785 Hyperlipidemia, unspecified: Secondary | ICD-10-CM | POA: Diagnosis present

## 2017-04-05 DIAGNOSIS — J449 Chronic obstructive pulmonary disease, unspecified: Secondary | ICD-10-CM | POA: Diagnosis present

## 2017-04-05 DIAGNOSIS — J9610 Chronic respiratory failure, unspecified whether with hypoxia or hypercapnia: Secondary | ICD-10-CM | POA: Diagnosis present

## 2017-04-05 DIAGNOSIS — F1010 Alcohol abuse, uncomplicated: Secondary | ICD-10-CM | POA: Diagnosis present

## 2017-04-05 DIAGNOSIS — F200 Paranoid schizophrenia: Secondary | ICD-10-CM | POA: Diagnosis present

## 2017-04-05 DIAGNOSIS — D649 Anemia, unspecified: Secondary | ICD-10-CM | POA: Diagnosis present

## 2017-04-05 DIAGNOSIS — Z823 Family history of stroke: Secondary | ICD-10-CM | POA: Diagnosis not present

## 2017-04-05 DIAGNOSIS — Z8249 Family history of ischemic heart disease and other diseases of the circulatory system: Secondary | ICD-10-CM | POA: Diagnosis not present

## 2017-04-05 DIAGNOSIS — I10 Essential (primary) hypertension: Secondary | ICD-10-CM | POA: Diagnosis present

## 2017-04-05 DIAGNOSIS — E119 Type 2 diabetes mellitus without complications: Secondary | ICD-10-CM | POA: Diagnosis present

## 2017-04-05 DIAGNOSIS — F329 Major depressive disorder, single episode, unspecified: Secondary | ICD-10-CM | POA: Diagnosis present

## 2017-04-05 DIAGNOSIS — Z8349 Family history of other endocrine, nutritional and metabolic diseases: Secondary | ICD-10-CM | POA: Diagnosis not present

## 2017-04-05 DIAGNOSIS — Z6841 Body Mass Index (BMI) 40.0 and over, adult: Secondary | ICD-10-CM

## 2017-04-05 DIAGNOSIS — R011 Cardiac murmur, unspecified: Secondary | ICD-10-CM | POA: Diagnosis present

## 2017-04-05 DIAGNOSIS — K7031 Alcoholic cirrhosis of liver with ascites: Secondary | ICD-10-CM | POA: Diagnosis present

## 2017-04-05 DIAGNOSIS — E042 Nontoxic multinodular goiter: Secondary | ICD-10-CM | POA: Diagnosis present

## 2017-04-05 DIAGNOSIS — I482 Chronic atrial fibrillation: Secondary | ICD-10-CM | POA: Diagnosis present

## 2017-04-05 DIAGNOSIS — Z811 Family history of alcohol abuse and dependence: Secondary | ICD-10-CM | POA: Diagnosis not present

## 2017-04-05 DIAGNOSIS — K766 Portal hypertension: Secondary | ICD-10-CM | POA: Diagnosis present

## 2017-04-05 DIAGNOSIS — K920 Hematemesis: Principal | ICD-10-CM | POA: Diagnosis present

## 2017-04-05 DIAGNOSIS — Z885 Allergy status to narcotic agent status: Secondary | ICD-10-CM | POA: Diagnosis not present

## 2017-04-05 DIAGNOSIS — I8500 Esophageal varices without bleeding: Secondary | ICD-10-CM | POA: Diagnosis present

## 2017-04-05 DIAGNOSIS — K7030 Alcoholic cirrhosis of liver without ascites: Secondary | ICD-10-CM

## 2017-04-05 DIAGNOSIS — Z79899 Other long term (current) drug therapy: Secondary | ICD-10-CM

## 2017-04-05 DIAGNOSIS — K2970 Gastritis, unspecified, without bleeding: Secondary | ICD-10-CM | POA: Diagnosis present

## 2017-04-05 DIAGNOSIS — E7800 Pure hypercholesterolemia, unspecified: Secondary | ICD-10-CM | POA: Diagnosis present

## 2017-04-05 DIAGNOSIS — F1020 Alcohol dependence, uncomplicated: Secondary | ICD-10-CM | POA: Diagnosis present

## 2017-04-05 DIAGNOSIS — G4733 Obstructive sleep apnea (adult) (pediatric): Secondary | ICD-10-CM | POA: Diagnosis present

## 2017-04-05 LAB — CBC
HCT: 37.5 % — ABNORMAL LOW (ref 40.0–52.0)
Hemoglobin: 12.2 g/dL — ABNORMAL LOW (ref 13.0–18.0)
MCH: 28.8 pg (ref 26.0–34.0)
MCHC: 32.5 g/dL (ref 32.0–36.0)
MCV: 88.6 fL (ref 80.0–100.0)
PLATELETS: 146 10*3/uL — AB (ref 150–440)
RBC: 4.23 MIL/uL — AB (ref 4.40–5.90)
RDW: 20.3 % — AB (ref 11.5–14.5)
WBC: 6.5 10*3/uL (ref 3.8–10.6)

## 2017-04-05 LAB — TYPE AND SCREEN
ABO/RH(D): O POS
ANTIBODY SCREEN: NEGATIVE

## 2017-04-05 LAB — COMPREHENSIVE METABOLIC PANEL
ALT: 54 U/L (ref 17–63)
AST: 88 U/L — AB (ref 15–41)
Albumin: 3.7 g/dL (ref 3.5–5.0)
Alkaline Phosphatase: 110 U/L (ref 38–126)
Anion gap: 7 (ref 5–15)
BILIRUBIN TOTAL: 1 mg/dL (ref 0.3–1.2)
BUN: 14 mg/dL (ref 6–20)
CHLORIDE: 102 mmol/L (ref 101–111)
CO2: 30 mmol/L (ref 22–32)
CREATININE: 1.13 mg/dL (ref 0.61–1.24)
Calcium: 9.2 mg/dL (ref 8.9–10.3)
Glucose, Bld: 89 mg/dL (ref 65–99)
POTASSIUM: 4.4 mmol/L (ref 3.5–5.1)
Sodium: 139 mmol/L (ref 135–145)
TOTAL PROTEIN: 8.4 g/dL — AB (ref 6.5–8.1)

## 2017-04-05 LAB — PROTIME-INR
INR: 1.2
PROTHROMBIN TIME: 15.3 s — AB (ref 11.4–15.2)

## 2017-04-05 MED ORDER — SODIUM CHLORIDE 0.9 % IV SOLN
50.0000 ug/h | INTRAVENOUS | Status: DC
  Administered 2017-04-05: 50 ug/h via INTRAVENOUS
  Filled 2017-04-05 (×4): qty 1

## 2017-04-05 MED ORDER — DEXTROSE 5 % IV SOLN
1.0000 g | Freq: Once | INTRAVENOUS | Status: AC
  Administered 2017-04-05: 1 g via INTRAVENOUS
  Filled 2017-04-05: qty 10

## 2017-04-05 MED ORDER — GI COCKTAIL ~~LOC~~
30.0000 mL | Freq: Once | ORAL | Status: AC
  Administered 2017-04-05: 30 mL via ORAL
  Filled 2017-04-05: qty 30

## 2017-04-05 MED ORDER — PANTOPRAZOLE SODIUM 40 MG IV SOLR
80.0000 mg | Freq: Once | INTRAVENOUS | Status: AC
  Administered 2017-04-05: 22:00:00 80 mg via INTRAVENOUS
  Filled 2017-04-05: qty 80

## 2017-04-05 NOTE — ED Triage Notes (Signed)
Arrives with c/o vomiting blood x 3.  Onset of symptoms today about 1 hour PTA.

## 2017-04-05 NOTE — ED Triage Notes (Signed)
Mother to stat desk. Updated about wait time. Patient in NAD at this time.

## 2017-04-05 NOTE — H&P (Signed)
[facility] [place] at [facility]   PATIENT NAME: Howard [name]    MR#:  [id number]  DATE OF BIRTH:  [birth year]  DATE OF ADMISSION:  04/05/2017  PRIMARY CARE PHYSICIAN: Galen ManilaKennedy, [name], NP   REQUESTING/REFERRING PHYSICIAN: Roxan Hockeyobinson, MD  CHIEF COMPLAINT:   Chief Complaint  Patient presents with  . Hematemesis    HISTORY OF PRESENT ILLNESS:  Howard [name]  is a 36 y.o. male who presents with An episode of hematemesis. Patient states that he vomited up blood 3 times today. He is been admitted for this multiple times in the recent past. He had an endoscopy in February that she which was normal and showed no varices. However, GI was consulted from the ED and recommended bringing him in for possible endoscopy tomorrow. Hospitalists were called for admission  PAST MEDICAL HISTORY:   Past Medical History:  Diagnosis Date  . Alcoholic cirrhosis of liver with ascites (HCC)   . Alcoholism (HCC)   . Anemia   . Atrial fibrillation (HCC)   . COPD (chronic obstructive pulmonary disease) (HCC)   . Depression   . Diabetes mellitus, type II (HCC)   . Esophageal varices (HCC)   . Heart disease    irregular heart beat (palpitations) and heart murmur  . Hyperlipemia   . Hypertension   . Liver disease   . Multiple thyroid nodules   . Portal hypertensive gastropathy (HCC)   . Schizophrenia (HCC)   . Seizures (HCC)     PAST SURGICAL HISTORY:   Past Surgical History:  Procedure Laterality Date  . ESOPHAGOGASTRODUODENOSCOPY N/A 10/05/2015   Procedure: ESOPHAGOGASTRODUODENOSCOPY (EGD);  Surgeon: Elnita MaxwellMatthew Gordon Rein, MD;  [facility];  Service: Endoscopy;  Laterality: N/A;  . ESOPHAGOGASTRODUODENOSCOPY (EGD) WITH PROPOFOL N/A 08/03/2015   Procedure: ESOPHAGOGASTRODUODENOSCOPY (EGD) WITH PROPOFOL;  Surgeon: Elnita MaxwellMatthew Gordon Rein, MD;  [facility];  Service: Endoscopy;  Laterality: N/A;  . ESOPHAGOGASTRODUODENOSCOPY (EGD) WITH PROPOFOL N/A  08/31/2015   Procedure: ESOPHAGOGASTRODUODENOSCOPY (EGD) WITH PROPOFOL;  Surgeon: Elnita MaxwellMatthew Gordon Rein, MD;  [facility];  Service: Endoscopy;  Laterality: N/A;  . ESOPHAGOGASTRODUODENOSCOPY (EGD) WITH PROPOFOL N/A 04/04/2016   Procedure: ESOPHAGOGASTRODUODENOSCOPY (EGD) WITH PROPOFOL;  Surgeon: Scot Junobert T Elliott, MD;  [facility];  Service: Endoscopy;  Laterality: N/A;  . ESOPHAGOGASTRODUODENOSCOPY (EGD) WITH PROPOFOL N/A 11/13/2016   Procedure: ESOPHAGOGASTRODUODENOSCOPY (EGD) WITH PROPOFOL;  Surgeon: Wyline MoodKiran Anna, MD;  Location: [facility] ENDOSCOPY;  Service: Endoscopy;  Laterality: N/A;  . NO PAST SURGERIES      SOCIAL HISTORY:   Social History  Substance Use Topics  . Smoking status: Never Smoker  . Smokeless tobacco: Never Used  . Alcohol use 3.6 oz/week    6 Cans of beer per week     Comment: "one quart" 2-3 times per week    FAMILY HISTORY:   Family History  Problem Relation Age of Onset  . Heart disease Mother   . Hypertension Mother   . Hyperlipidemia Mother   . Stroke Father   . Heart attack Father   . Hypertension Father   . Heart disease Father   . Alcohol abuse Father   . Heart disease Brother     DRUG ALLERGIES:   Allergies  Allergen Reactions  . Tramadol Itching    MEDICATIONS AT HOME:   Prior to Admission medications   Medication Sig Start Date End Date Taking? Authorizing Provider  acamprosate (CAMPRAL) 333 MG tablet Take 2 tablets (666 mg total) by mouth 3 (three) times daily  with meals. 03/24/17  Yes [name], [name], MD  albuterol (PROVENTIL HFA;VENTOLIN HFA) 108 (90 Base) MCG/ACT inhaler Inhale 2 puffs into the lungs every 6 (six) hours as needed for wheezing or shortness of breath. 10/08/16  Yes Shaune Pollack, MD  docusate sodium (COLACE) 100 MG capsule Take 100 mg by mouth daily as needed for mild constipation.   Yes [provider]  ferrous sulfate 325 (65 FE) MG tablet Take 3 tablets (975 mg total) by mouth every Monday,  Wednesday, and Friday. With breakfast 01/21/17  Yes [name], NP  folic acid (FOLVITE) 1 MG tablet Take 1 tablet (1 mg total) by mouth daily. 12/19/16  Yes Gouru, Deanna Artis, MD  furosemide (LASIX) 20 MG tablet Take 20 mg by mouth daily.   Yes [provider]  lactulose (CHRONULAC) 10 GM/15ML solution Take 45 mLs (30 g total) by mouth every 4 (four) hours. Patient taking differently: Take 30 g by mouth 4 (four) times daily.  07/09/16  Yes Katharina Caper, MD  levETIRAcetam (KEPPRA) 750 MG tablet Take 2 tablets (1,500 mg total) by mouth 2 (two) times daily. 12/11/16 04/05/18 Yes Merrily Brittle, MD  losartan (COZAAR) 25 MG tablet Take 1 tablet (25 mg total) by mouth daily. 03/11/17  Yes [name], NP  lurasidone (LATUDA) 80 MG TABS tablet Take 1 tablet (80 mg total) by mouth daily with supper. 03/24/17  Yes [name], [name], MD  metFORMIN (GLUCOPHAGE) 500 MG tablet TAKE 1 TABLET BY MOUTH 2 TIMES DAILY WITH MEALS 03/26/17  Yes [name], NP  nadolol (CORGARD) 20 MG tablet Take 1 tablet (20 mg total) by mouth daily. 03/11/17  Yes [name], NP  omeprazole (PRILOSEC) 40 MG capsule Take 40 mg by mouth daily.   Yes [provider]  rifaximin (XIFAXAN) 550 MG TABS tablet Take 1 tablet (550 mg total) by mouth 2 (two) times daily. 07/09/16  Yes Katharina Caper, MD  spironolactone (ALDACTONE) 50 MG tablet Take 1 tablet (50 mg total) by mouth 2 (two) times daily. 03/02/17  Yes [name], NP  sucralfate (CARAFATE) 1 g tablet Take 1 tablet by mouth 4 (four) times daily.   Yes [provider]  thiamine (VITAMIN B-1) 100 MG tablet Take 100 mg by mouth daily.   Yes [provider]  traZODone (DESYREL) 100 MG tablet Take 1 tablet (100 mg total) by mouth at bedtime as needed for sleep. 03/24/17  Yes [name], [name], MD  esomeprazole (NEXIUM) 40 MG capsule Take 1 capsule (40 mg total) by mouth daily. Patient not taking: Reported on 04/05/2017  04/03/17 04/03/18  Charlynne Pander, MD  [facility]     [provider]    REVIEW OF SYSTEMS:  Review of Systems  Constitutional: Negative for chills, fever, malaise/fatigue and weight loss.  HENT: Negative for ear pain, hearing loss and tinnitus.   Eyes: Negative for blurred vision, double vision, pain and redness.  Respiratory: Negative for cough, hemoptysis and shortness of breath.   Cardiovascular: Negative for chest pain, palpitations, orthopnea and leg swelling.  Gastrointestinal: Negative for abdominal pain, constipation, diarrhea, nausea and vomiting.       Hematemesis  Genitourinary: Negative for dysuria, frequency and hematuria.  Musculoskeletal: Negative for back pain, joint pain and neck pain.  Skin:       No acne, rash, or lesions  Neurological: Negative for dizziness, tremors, focal weakness and weakness.  Endo/Heme/Allergies: Negative for polydipsia. Does not bruise/bleed easily.  Psychiatric/Behavioral:  Negative for depression. The patient is not nervous/anxious and does not have insomnia.      VITAL SIGNS:   Vitals:   04/05/17 2100 04/05/17 2130 04/05/17 2230 04/05/17 2300  BP: 133/81 119/73 128/79 140/79  Pulse: 68 65 67 63  Resp: 16 20 14 18   Temp:      SpO2: 95% 97% 96%   Weight:      Height:       Wt Readings from Last 3 Encounters:  04/05/17 (!) 157.9 kg (348 lb)  04/03/17 (!) 157.9 kg (348 lb)  03/29/17 (!) 157.9 kg (348 lb)    PHYSICAL EXAMINATION:  Physical Exam  Vitals reviewed. Constitutional: He is oriented to person, place, and time. He appears well-developed and well-nourished. No distress.  HENT:  Head: Normocephalic and atraumatic.  Mouth/Throat: Oropharynx is clear and moist.  Eyes: Pupils are equal, round, and reactive to light. Conjunctivae and EOM are normal. No scleral icterus.  Neck: Normal range of motion. Neck supple. No JVD present. No thyromegaly present.  Cardiovascular: Normal rate, regular rhythm and intact  distal pulses.  Exam reveals no gallop and no friction rub.   No murmur heard. Respiratory: Effort normal and breath sounds normal. No respiratory distress. He has no wheezes. He has no rales.  GI: Soft. Bowel sounds are normal. He exhibits no distension. There is no tenderness.  Musculoskeletal: Normal range of motion. He exhibits no edema.  No arthritis, no gout  Lymphadenopathy:    He has no cervical adenopathy.  Neurological: He is alert and oriented to person, place, and time. No cranial nerve deficit.  No dysarthria, no aphasia  Skin: Skin is warm and dry. No rash noted. No erythema.  Psychiatric: He has a normal mood and affect. His behavior is normal. Judgment and thought content normal.    LABORATORY PANEL:   CBC  Recent Labs Lab 04/05/17 1633  WBC 6.5  HGB 12.2*  HCT 37.5*  PLT 146*   ------------------------------------------------------------------------------------------------------------------  Chemistries   Recent Labs Lab 04/05/17 1633  NA 139  K 4.4  CL 102  CO2 30  GLUCOSE 89  BUN 14  CREATININE 1.13  CALCIUM 9.2  AST 88*  ALT 54  ALKPHOS 110  BILITOT 1.0   ------------------------------------------------------------------------------------------------------------------  Cardiac Enzymes  Recent Labs Lab 04/03/17 2148  TROPONINI <0.03   ------------------------------------------------------------------------------------------------------------------  RADIOLOGY:  No results found.  EKG:   Orders placed or performed during the hospital encounter of 04/03/17  . ED EKG within 10 minutes  . ED EKG within 10 minutes  . EKG    IMPRESSION AND PLAN:  Principal Problem:   Hematemesis - patient has a history of cirrhosis, but fairly recent EGD negative for varices. He reports 3 episodes of hematemesis over the last 24 hours. His hemoglobin is stable. Octreotide drip and IV Protonix. GI consult placed Active Problems:   Alcohol abuse -  discussed with patient the importance of alcohol cessation. CIWA protocol   Alcoholic cirrhosis of liver with ascites (HCC) - avoid hepatotoxins   Controlled type 2 diabetes mellitus without complication (HCC) - sliding scale insulin with corresponding glucose checks   Essential (primary) hypertension - continue home meds  All the records are reviewed and case discussed with ED provider. Management plans discussed with the patient and/or family.  DVT PROPHYLAXIS: Mechanical only  GI PROPHYLAXIS: PPI  ADMISSION STATUS: Inpatient  CODE STATUS: Full Code Status History    Date Active Date Inactive Code Status Order ID Comments User Context  03/26/2017  8:25 AM 03/27/2017  5:25 PM Full Code [id number]  Ihor Austin, MD Inpatient   01/18/2017  4:35 PM 01/19/2017  3:16 PM Full Code [id number]  Marguarite Arbour, MD Inpatient   01/05/2017  2:57 PM 01/05/2017  9:46 PM Full Code [id number]  Arnaldo Natal, MD ED   12/30/2016  2:51 AM 01/01/2017  2:33 PM Full Code [id number]  Oralia Manis, MD Inpatient   12/18/2016 12:25 AM 12/19/2016  9:28 PM Full Code [id number]  Katharina Caper, MD Inpatient   12/12/2016 11:40 PM 12/15/2016  8:28 PM Full Code [id number]  Arnaldo Natal, MD Inpatient   11/24/2016  1:11 AM 11/25/2016  4:42 PM Full Code [id number]  Hugelmeyer, Alexis, DO Inpatient   11/12/2016  3:36 PM 11/14/2016  2:36 PM Full Code [id number]  Auburn Bilberry, MD Inpatient   10/04/2016  6:04 PM 10/08/2016  3:44 PM Full Code [id number]  Shaune Pollack, MD Inpatient   09/22/2016  2:29 AM 09/23/2016  6:38 PM Full Code [id number]  Oralia Manis, MD Inpatient   08/07/2016  7:37 PM 08/12/2016  8:51 PM Full Code [id number]  Gracelyn Nurse, MD Inpatient   07/07/2016  1:40 AM 07/09/2016  6:29 PM Full Code [id number]  Ihor Austin, MD Inpatient   06/10/2016  6:21 AM 06/11/2016  8:25 PM Full Code [id number]  Ihor Austin, MD Inpatient   05/12/2016  3:55 AM 05/15/2016  2:57 PM Full Code [id number]  Ihor Austin, MD Inpatient   04/28/2016   2:54 AM 04/29/2016  6:23 PM Full Code [id number]  Hugelmeyer, Alexis, DO Inpatient   03/14/2016 12:30 PM 03/16/2016  3:41 PM Full Code [id number]  Shaune Pollack, MD Inpatient   03/03/2016 12:13 PM 03/06/2016  2:26 PM Full Code [id number]  Milagros Loll, MD ED   02/10/2016  5:54 PM 02/12/2016  7:34 PM Full Code [id number]  Ramonita Lab, MD Inpatient   11/11/2015  5:16 AM 11/13/2015  6:00 PM Full Code [id number]  Ihor Austin, MD Inpatient   11/07/2015 10:02 PM 11/09/2015  4:56 PM Full Code [id number]  Enid Baas, MD Inpatient   10/28/2015  8:14 PM 10/29/2015  2:26 PM Full Code [id number]  Altamese Dilling, MD Inpatient   10/16/2015 11:47 PM 10/17/2015  6:00 PM Full Code [id number]  Oralia Manis, MD Inpatient   07/30/2015  5:35 PM 08/04/2015  8:34 PM Full Code [id number]  Enedina Finner, MD Inpatient   07/05/2015  1:16 AM 07/08/2015  7:22 PM Full Code [id number]  Oralia Manis, MD Inpatient   05/30/2015  7:28 PM 06/01/2015  3:36 PM Full Code [id number]  [name], [name], MD Inpatient      TOTAL TIME TAKING CARE OF THIS PATIENT: 45 minutes.   Alinah Sheard FIELDING 04/05/2017, 11:48 PM  [facility]  Office  [phone]  CC: Primary care physician; [name], NP  Note:  This document was prepared using Dragon voice recognition software and may include unintentional dictation errors.

## 2017-04-05 NOTE — ED Notes (Signed)
Pt placed on 2L [place] which is what patient is on at home.

## 2017-04-05 NOTE — ED Provider Notes (Signed)
[facility] Emergency Department Provider Note    None    (approximate)  I have reviewed the triage vital signs and the nursing notes.   HISTORY  Chief Complaint Hematemesis    HPI Howard [name] is a 36 y.o. male monitor this facility with multiple chronic comorbidities occluding alcoholic cirrhosis with persistent alcohol abuse presents with 3 episodes of bright red blood hematemesis. Associated with nausea but no epigastric pain. This episode happened roughly 1 hour prior to arrival. He is able to ambulate in the room. Denies any melena. No diarrhea. Patient was admitted for similar symptoms 10 days ago. He was started on octreotide and Protonix but as his hemoglobin remains stable he was discharged to outpatient follow-up. Had an EGD back in February of this year which showed no evidence of esophageal varices status post banding. States that he did drink alcohol yesterday.   Past Medical History:  Diagnosis Date  . Alcoholic cirrhosis of liver with ascites (HCC)   . Alcoholism (HCC)   . Anemia   . Atrial fibrillation (HCC)   . COPD (chronic obstructive pulmonary disease) (HCC)   . Depression   . Diabetes mellitus, type II (HCC)   . Esophageal varices (HCC)   . Heart disease    irregular heart beat (palpitations) and heart murmur  . Hyperlipemia   . Hypertension   . Liver disease   . Multiple thyroid nodules   . Portal hypertensive gastropathy (HCC)   . Schizophrenia (HCC)   . Seizures (HCC)    Family History  Problem Relation Age of Onset  . Heart disease Mother   . Hypertension Mother   . Hyperlipidemia Mother   . Stroke Father   . Heart attack Father   . Hypertension Father   . Heart disease Father   . Alcohol abuse Father   . Heart disease Brother    Past Surgical History:  Procedure Laterality Date  . ESOPHAGOGASTRODUODENOSCOPY N/A 10/05/2015   Procedure: ESOPHAGOGASTRODUODENOSCOPY (EGD);  Surgeon: Elnita MaxwellMatthew Gordon Rein, MD;   [facility];  Service: Endoscopy;  Laterality: N/A;  . ESOPHAGOGASTRODUODENOSCOPY (EGD) WITH PROPOFOL N/A 08/03/2015   Procedure: ESOPHAGOGASTRODUODENOSCOPY (EGD) WITH PROPOFOL;  Surgeon: Elnita MaxwellMatthew Gordon Rein, MD;  [facility];  Service: Endoscopy;  Laterality: N/A;  . ESOPHAGOGASTRODUODENOSCOPY (EGD) WITH PROPOFOL N/A 08/31/2015   Procedure: ESOPHAGOGASTRODUODENOSCOPY (EGD) WITH PROPOFOL;  Surgeon: Elnita MaxwellMatthew Gordon Rein, MD;  [facility];  Service: Endoscopy;  Laterality: N/A;  . ESOPHAGOGASTRODUODENOSCOPY (EGD) WITH PROPOFOL N/A 04/04/2016   Procedure: ESOPHAGOGASTRODUODENOSCOPY (EGD) WITH PROPOFOL;  Surgeon: Scot Junobert T Elliott, MD;  [facility];  Service: Endoscopy;  Laterality: N/A;  . ESOPHAGOGASTRODUODENOSCOPY (EGD) WITH PROPOFOL N/A 11/13/2016   Procedure: ESOPHAGOGASTRODUODENOSCOPY (EGD) WITH PROPOFOL;  Surgeon: Wyline MoodKiran Anna, MD;  Location: [facility] ENDOSCOPY;  Service: Endoscopy;  Laterality: N/A;  . NO PAST SURGERIES     Patient Active Problem List   Diagnosis Date Noted  . Hematemesis 04/05/2017  . Upper GI bleed   . Iron deficiency anemia due to chronic blood loss 02/16/2017  . Nausea 01/18/2017  . Respiratory failure with hypoxia (HCC) 12/14/2016  . Seizures (HCC) 12/12/2016  . DNR (do not resuscitate) discussion 08/11/2016  . Muscle weakness (generalized)   . GI bleed 08/07/2016  . OSA (obstructive sleep apnea) 04/29/2016  . Anemia 04/29/2016  . Thrombocytopenia (HCC) 04/29/2016  . Coagulopathy (HCC) 04/29/2016  . Obesity 04/29/2016  . Controlled type 2 diabetes mellitus without complication (HCC) 01/15/2016  . Essential (primary) hypertension 12/11/2015  .  Pure hypercholesterolemia 12/11/2015  . Hepatic encephalopathy (HCC) 10/16/2015  . 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 Elevated transaminase level 07/04/2015  . Alcohol abuse 05/31/2015  . Paranoid schizophrenia (HCC) 03/20/2015      Prior to  Admission medications   Medication Sig Start Date End Date Taking? Authorizing Provider  acamprosate (CAMPRAL) 333 MG tablet Take 2 tablets (666 mg total) by mouth 3 (three) times daily with meals. 03/24/17  Yes [name], [name], MD  albuterol (PROVENTIL HFA;VENTOLIN HFA) 108 (90 Base) MCG/ACT inhaler Inhale 2 puffs into the lungs every 6 (six) hours as needed for wheezing or shortness of breath. 10/08/16  Yes Shaune Pollack, MD  docusate sodium (COLACE) 100 MG capsule Take 100 mg by mouth daily as needed for mild constipation.   Yes [provider]  ferrous sulfate 325 (65 FE) MG tablet Take 3 tablets (975 mg total) by mouth every Monday, Wednesday, and Friday. With breakfast 01/21/17  Yes [name], NP  folic acid (FOLVITE) 1 MG tablet Take 1 tablet (1 mg total) by mouth daily. 12/19/16  Yes Gouru, Deanna Artis, MD  furosemide (LASIX) 20 MG tablet Take 20 mg by mouth daily.   Yes [provider]  lactulose (CHRONULAC) 10 GM/15ML solution Take 45 mLs (30 g total) by mouth every 4 (four) hours. Patient taking differently: Take 30 g by mouth 4 (four) times daily.  07/09/16  Yes Katharina Caper, MD  levETIRAcetam (KEPPRA) 750 MG tablet Take 2 tablets (1,500 mg total) by mouth 2 (two) times daily. 12/11/16 04/05/18 Yes Merrily Brittle, MD  losartan (COZAAR) 25 MG tablet Take 1 tablet (25 mg total) by mouth daily. 03/11/17  Yes [name], NP  lurasidone (LATUDA) 80 MG TABS tablet Take 1 tablet (80 mg total) by mouth daily with supper. 03/24/17  Yes [name], [name], MD  metFORMIN (GLUCOPHAGE) 500 MG tablet TAKE 1 TABLET BY MOUTH 2 TIMES DAILY WITH MEALS 03/26/17  Yes [name], NP  nadolol (CORGARD) 20 MG tablet Take 1 tablet (20 mg total) by mouth daily. 03/11/17  Yes [name], NP  omeprazole (PRILOSEC) 40 MG capsule Take 40 mg by mouth daily.   Yes [provider]  rifaximin (XIFAXAN) 550 MG TABS tablet Take 1 tablet (550 mg total) by mouth 2 (two) times  daily. 07/09/16  Yes Katharina Caper, MD  spironolactone (ALDACTONE) 50 MG tablet Take 1 tablet (50 mg total) by mouth 2 (two) times daily. 03/02/17  Yes [name], NP  sucralfate (CARAFATE) 1 g tablet Take 1 tablet by mouth 4 (four) times daily.   Yes [provider]  thiamine (VITAMIN B-1) 100 MG tablet Take 100 mg by mouth daily.   Yes [provider]  traZODone (DESYREL) 100 MG tablet Take 1 tablet (100 mg total) by mouth at bedtime as needed for sleep. 03/24/17  Yes [name], [name], MD  clonazePAM (KLONOPIN) 0.5 MG tablet Take 1 tablet (0.5 mg total) by mouth 2 (two) times daily as needed for anxiety. Patient not taking: Reported on 03/27/2017 03/24/17   [name], [name], MD  esomeprazole (NEXIUM) 40 MG capsule Take 1 capsule (40 mg total) by mouth daily. Patient not taking: Reported on 04/05/2017 04/03/17 04/03/18  Charlynne Pander, MD  [facility] FORMUL[facility]     [provider]    Allergies Tramadol    Social History Social History  Substance Use Topics  . Smoking status: Never Smoker  . Smokeless tobacco:  Never Used  . Alcohol use 3.6 oz/week    6 Cans of beer per week     Comment: "one quart" 2-3 times per week    Review of Systems Patient denies headaches, rhinorrhea, blurry vision, numbness, shortness of breath, chest pain, edema, cough, abdominal pain, nausea, vomiting, diarrhea, dysuria, fevers, rashes or hallucinations unless otherwise stated above in HPI. ____________________________________________   PHYSICAL EXAM:  VITAL SIGNS: Vitals:   04/05/17 2230 04/05/17 2300  BP: 128/79 140/79  Pulse: 67 63  Resp: 14 18  Temp:      Constitutional: Alert and oriented. Chronically ill appearing Eyes: Conjunctivae are normal.  Head: Atraumatic. Nose: No congestion/rhinnorhea. Mouth/Throat: Mucous membranes are moist.   Neck: No stridor. Painless ROM.  Cardiovascular: Normal rate, regular rhythm. Grossly normal heart sounds.  Good peripheral  circulation. Respiratory: Normal respiratory effort.  No retractions. Lungs CTAB. Gastrointestinal: Soft and nontender.  No abdominal bruits. No CVA tenderness. Musculoskeletal: No lower extremity tenderness nor edema.  No joint effusions. Neurologic:  Normal speech and language. No gross focal neurologic deficits are appreciated. No facial droop Skin:  Skin is warm, dry and intact. No rash noted. Psychiatric: Mood and affect are normal. Speech and behavior are normal.  ____________________________________________   LABS (all labs ordered are listed, but only abnormal results are displayed)  Results for orders placed or performed during the hospital encounter of 04/05/17 (from the past 24 hour(s))  Comprehensive metabolic panel     Status: Abnormal   Collection Time: 04/05/17  4:33 PM  Result Value Ref Range   Sodium 139 135 - 145 mmol/L   Potassium 4.4 3.5 - 5.1 mmol/L   Chloride 102 101 - 111 mmol/L   CO2 30 22 - 32 mmol/L   Glucose, Bld 89 65 - 99 mg/dL   BUN 14 6 - 20 mg/dL   Creatinine, Ser 1.61 0.61 - 1.24 mg/dL   Calcium 9.2 8.9 - 09.6 mg/dL   Total Protein 8.4 (H) 6.5 - 8.1 g/dL   Albumin 3.7 3.5 - 5.0 g/dL   AST 88 (H) 15 - 41 U/L   ALT 54 17 - 63 U/L   Alkaline Phosphatase 110 38 - 126 U/L   Total Bilirubin 1.0 0.3 - 1.2 mg/dL   GFR calc non Af Amer >60 >60 mL/min   GFR calc Af Amer >60 >60 mL/min   Anion gap 7 5 - 15  CBC     Status: Abnormal   Collection Time: 04/05/17  4:33 PM  Result Value Ref Range   WBC 6.5 3.8 - 10.6 K/uL   RBC 4.23 (L) 4.40 - 5.90 MIL/uL   Hemoglobin 12.2 (L) 13.0 - 18.0 g/dL   HCT 04.5 (L) 40.9 - 81.1 %   MCV 88.6 80.0 - 100.0 fL   MCH 28.8 26.0 - 34.0 pg   MCHC 32.5 32.0 - 36.0 g/dL   RDW 91.4 (H) 78.2 - 95.6 %   Platelets 146 (L) 150 - 440 K/uL  Type and screen [facility]     Status: None   Collection Time: 04/05/17  4:33 PM  Result Value Ref Range   ABO/RH(D) O POS    Antibody Screen NEG    Sample  Expiration 04/08/2017   Protime-INR     Status: Abnormal   Collection Time: 04/05/17  8:38 PM  Result Value Ref Range   Prothrombin Time 15.3 (H) 11.4 - 15.2 seconds   INR 1.20    ____________________________________________  EKG My  review and personal interpretation at Time: 18:21   Indication: hematemesis  Rate: 75  Rhythm: sinus Axis: normal Other: normal intervals, no stemi ____________________________________________  RADIOLOGY   ____________________________________________   PROCEDURES  Procedure(s) performed:  Procedures    Critical Care performed: no ____________________________________________   INITIAL IMPRESSION / ASSESSMENT AND PLAN / ED COURSE  Pertinent labs & imaging results that were available during my care of the patient were reviewed by me and considered in my medical decision making (see chart for details).  DDX: ugi, esophageal bleed, gastritis, enteritis, cirrhosis  Howard [name] is a 36 y.o. who presents to the ED with hematemesis as described above. He is hemodynamically stable but is very complex with multiple comorbidities. Patient without any evidence of acute blood loss at this time. Hemoglobin is stable. No elevation of BUN. Spoke with Dr. Bosie Clos of gastroenterology who is recommending octreotide drip as well as Protonix and Rocephin with plan for consultation and probable scope in the morning.  Have discussed with the patient and available family all diagnostics and treatments performed thus far and all questions were answered to the best of my ability. The patient demonstrates understanding and agreement with plan.       ____________________________________________   FINAL CLINICAL IMPRESSION(S) / ED DIAGNOSES  Final diagnoses:  Hematemesis with nausea  Alcoholic cirrhosis, unspecified whether ascites present (HCC)      NEW MEDICATIONS STARTED DURING THIS VISIT:  New Prescriptions   No medications on file     Note:  This  document was prepared using Dragon voice recognition software and may include unintentional dictation errors.    Willy Eddy, MD 04/05/17 [phone]

## 2017-04-06 LAB — GLUCOSE, CAPILLARY
GLUCOSE-CAPILLARY: 129 mg/dL — AB (ref 65–99)
GLUCOSE-CAPILLARY: 167 mg/dL — AB (ref 65–99)
Glucose-Capillary: 102 mg/dL — ABNORMAL HIGH (ref 65–99)
Glucose-Capillary: 86 mg/dL (ref 65–99)
Glucose-Capillary: 89 mg/dL (ref 65–99)

## 2017-04-06 LAB — BASIC METABOLIC PANEL
ANION GAP: 8 (ref 5–15)
BUN: 16 mg/dL (ref 6–20)
CHLORIDE: 102 mmol/L (ref 101–111)
CO2: 30 mmol/L (ref 22–32)
Calcium: 8.9 mg/dL (ref 8.9–10.3)
Creatinine, Ser: 1.15 mg/dL (ref 0.61–1.24)
GFR calc Af Amer: >60 mL/min (ref >60)
GLUCOSE: 90 mg/dL (ref 65–99)
POTASSIUM: 4.2 mmol/L (ref 3.5–5.1)
Sodium: 140 mmol/L (ref 135–145)

## 2017-04-06 LAB — CBC
HEMATOCRIT: 34.6 % — AB (ref 40.0–52.0)
HEMOGLOBIN: 11.2 g/dL — AB (ref 13.0–18.0)
MCH: 28.4 pg (ref 26.0–34.0)
MCHC: 32.3 g/dL (ref 32.0–36.0)
MCV: 87.7 fL (ref 80.0–100.0)
Platelets: 110 10*3/uL — ABNORMAL LOW (ref 150–440)
RBC: 3.94 MIL/uL — ABNORMAL LOW (ref 4.40–5.90)
RDW: 20.4 % — AB (ref 11.5–14.5)
WBC: 5.7 10*3/uL (ref 3.8–10.6)

## 2017-04-06 LAB — HEMOGLOBIN: Hemoglobin: 11.7 g/dL — ABNORMAL LOW (ref 13.0–18.0)

## 2017-04-06 MED ORDER — LORAZEPAM 2 MG/ML IJ SOLN: 1.0000 mg | Freq: Four times a day (QID) | INTRAMUSCULAR | Status: DC | PRN

## 2017-04-06 MED ORDER — LEVETIRACETAM 750 MG PO TABS
1500.0000 mg | ORAL_TABLET | Freq: Two times a day (BID) | ORAL | Status: DC
  Administered 2017-04-06 (×3): 1500 mg via ORAL
  Filled 2017-04-06 (×5): qty 2

## 2017-04-06 MED ORDER — FUROSEMIDE 20 MG PO TABS
20.0000 mg | ORAL_TABLET | Freq: Every day | ORAL | Status: DC
  Administered 2017-04-06: 20 mg via ORAL
  Filled 2017-04-06: qty 1

## 2017-04-06 MED ORDER — LORAZEPAM 2 MG/ML IJ SOLN
0.0000 mg | Freq: Four times a day (QID) | INTRAMUSCULAR | Status: DC
Start: 2017-04-06 — End: 2017-04-07

## 2017-04-06 MED ORDER — RIFAXIMIN 550 MG PO TABS
550.0000 mg | ORAL_TABLET | Freq: Two times a day (BID) | ORAL | Status: DC
  Administered 2017-04-06 (×3): 550 mg via ORAL
  Filled 2017-04-06 (×3): qty 1

## 2017-04-06 MED ORDER — LACTULOSE 10 GM/15ML PO SOLN
30.0000 g | Freq: Four times a day (QID) | ORAL | Status: DC
  Administered 2017-04-06 (×4): 30 g via ORAL
  Filled 2017-04-06 (×4): qty 60

## 2017-04-06 MED ORDER — OXYCODONE HCL 5 MG PO TABS
5.0000 mg | ORAL_TABLET | Freq: Four times a day (QID) | ORAL | Status: DC | PRN
  Administered 2017-04-06: 5 mg via ORAL
  Filled 2017-04-06: qty 1

## 2017-04-06 MED ORDER — PANTOPRAZOLE SODIUM 40 MG PO TBEC
40.0000 mg | DELAYED_RELEASE_TABLET | Freq: Every day | ORAL | Status: DC
  Administered 2017-04-06: 40 mg via ORAL
  Filled 2017-04-06: qty 1

## 2017-04-06 MED ORDER — LORAZEPAM 2 MG/ML IJ SOLN: 0.0000 mg | Freq: Two times a day (BID) | INTRAMUSCULAR | Status: DC

## 2017-04-06 MED ORDER — INSULIN ASPART 100 UNIT/ML ~~LOC~~ SOLN
0.0000 [IU] | Freq: Four times a day (QID) | SUBCUTANEOUS | Status: DC
  Filled 2017-04-06: qty 1

## 2017-04-06 MED ORDER — FOLIC ACID 1 MG PO TABS
1.0000 mg | ORAL_TABLET | Freq: Every day | ORAL | Status: DC
  Administered 2017-04-06: 1 mg via ORAL
  Filled 2017-04-06: qty 1

## 2017-04-06 MED ORDER — LOSARTAN POTASSIUM 25 MG PO TABS
25.0000 mg | ORAL_TABLET | Freq: Every day | ORAL | Status: DC
  Administered 2017-04-06: 25 mg via ORAL
  Filled 2017-04-06: qty 1

## 2017-04-06 MED ORDER — SODIUM CHLORIDE 0.9 % IV SOLN
INTRAVENOUS | Status: DC
  Administered 2017-04-06 (×2): via INTRAVENOUS

## 2017-04-06 MED ORDER — NADOLOL 20 MG PO TABS
20.0000 mg | ORAL_TABLET | Freq: Every day | ORAL | Status: DC
  Administered 2017-04-06: 20 mg via ORAL
  Filled 2017-04-06 (×2): qty 1

## 2017-04-06 MED ORDER — ONDANSETRON HCL 4 MG PO TABS: 4.0000 mg | ORAL_TABLET | Freq: Four times a day (QID) | ORAL | Status: DC | PRN

## 2017-04-06 MED ORDER — ACAMPROSATE CALCIUM 333 MG PO TBEC
666.0000 mg | DELAYED_RELEASE_TABLET | Freq: Three times a day (TID) | ORAL | Status: DC
  Administered 2017-04-06 (×2): 666 mg via ORAL
  Filled 2017-04-06 (×6): qty 2

## 2017-04-06 MED ORDER — LORAZEPAM 1 MG PO TABS
1.0000 mg | ORAL_TABLET | Freq: Four times a day (QID) | ORAL | Status: DC | PRN
Start: 2017-04-06 — End: 2017-04-07

## 2017-04-06 MED ORDER — VITAMIN B-1 100 MG PO TABS
100.0000 mg | ORAL_TABLET | Freq: Every day | ORAL | Status: DC
  Administered 2017-04-06: 100 mg via ORAL
  Filled 2017-04-06: qty 1

## 2017-04-06 MED ORDER — ONDANSETRON HCL 4 MG/2ML IJ SOLN: 4.0000 mg | Freq: Four times a day (QID) | INTRAMUSCULAR | Status: DC | PRN

## 2017-04-06 MED ORDER — ADULT MULTIVITAMIN W/MINERALS CH
1.0000 | ORAL_TABLET | Freq: Every day | ORAL | Status: DC
  Administered 2017-04-06: 1 via ORAL
  Filled 2017-04-06: qty 1

## 2017-04-06 MED ORDER — SPIRONOLACTONE 25 MG PO TABS
50.0000 mg | ORAL_TABLET | Freq: Two times a day (BID) | ORAL | Status: DC
  Administered 2017-04-06 (×3): 50 mg via ORAL
  Filled 2017-04-06 (×3): qty 2

## 2017-04-06 MED ORDER — LURASIDONE HCL 80 MG PO TABS
80.0000 mg | ORAL_TABLET | Freq: Every day | ORAL | Status: DC
  Administered 2017-04-06: 80 mg via ORAL
  Filled 2017-04-06 (×2): qty 1

## 2017-04-06 NOTE — Progress Notes (Signed)
New Admission Note:   Arrival Method: per stretcher from ED, pt came from home Mental Orientation: alert and oriented X4, delayed responses oftentimes Telemetry: none ordered Assessment: Completed Skin: warm, dry, with scratch marks noted on the lower extremities, with psoriasis noted on the shin, largest one noted on the right, psoriasis on the legs and hands IV: G20 on the right AC with transparent dressing, intact, Ocreotide infusion at 5225ml/hr on going Pain: 2/10 scale left abdominal pain. Pt claimed pain is 'tolerable' and would not want pain medicine this time Tubes: O2 inhalation at 2Lpm-chronic, pt's own O2 tank at bedside Safety Measures: Safety Fall Prevention Plan has been given and discussed Admission: Completed 1A Orientation: Patient has been oriented to the room, unit and staff.  Family: no family members present at bedside this time  Orders have been reviewed and implemented. Will continue to monitor patient. Call light has been placed within reach and bed alarm has been activated.   Janice NorrieAnessa Giannina Bartolome, RN [facility]A

## 2017-04-06 NOTE — Clinical Social Work Note (Signed)
Clinical Social Work Assessment  Patient Details  Name: Howard [name] MRN: [id number] Date of Birth: 04-23-1981  Date of referral:  04/06/17               Reason for consult:  Substance Use/ETOH Abuse                Permission sought to share information with:    Permission granted to share information::     Name::        Agency::     Relationship::     Contact Information:     Housing/Transportation Living arrangements for the past 2 months:  Single Family Home Source of Information:  Patient Patient Interpreter Needed:  None Criminal Activity/Legal Involvement Pertinent to Current Situation/Hospitalization:  No - Comment as needed Significant Relationships:  Other Family Members, Parents Lives with:  Siblings, Parents Do you feel safe going back to the place where you live?  Yes Need for family participation in patient care:  Yes (Comment)  Care giving concerns:  Patient lives in [place] with his mother and brother.    Social Worker assessment / plan:  Holiday representative ([place]) received verbal consult from RN in progression rounds this morning that patient has a history of ETOH. CSW met with patient alone at bedside to provide resources. Patient was alert and oriented X4 and was laying in the bed. CSW introduced self and explained role of CSW department. Patient reported that he lives in a house that his aunt left him with his mother and brother. Patient reported that he stopped drinking and was going to [employer] consistently. Patient reported that he relapsed this weekend and had 1 beer. CSW provided emotional support and outpatient substance abuse resources. Patient thanked CSW for visit and stated that he will likely follow up with [facility] once he is at home. CSW will continue to follow and assist as needed.   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   Patient/Family's Understanding of and Emotional Response to Diagnosis, Current Treatment, and Prognosis:  Patient was pleasant and thanked CSW for visit.   Emotional Assessment Appearance:  Appears stated age Attitude/Demeanor/Rapport:    Affect (typically observed):  Accepting, Adaptable, Pleasant Orientation:  Oriented to Self, Oriented to Place, Oriented to Situation, Oriented to  Time Alcohol / Substance use:  Not Applicable Psych involvement (Current and /or in the community):  No (Comment)  Discharge Needs  Concerns to be addressed:  Discharge Planning Concerns Readmission within the last 30 days:  No Current discharge risk:  Chronically ill, Substance Abuse Barriers to Discharge:  Continued Medical Work up   [employer], Veronia Beets, LCSW 04/06/2017, 3:57 PM

## 2017-04-06 NOTE — Care Management Note (Addendum)
Case Management Note  Patient Details  Name: Howard [name] MRN: [id number] Date of Birth: 01-03-1981  Subjective/Objective:  Patient presents from home. Lives with his mom. Has chronic O2 through [employer]. [facility] with Nausea, Potential GI bleed and ETOH abuse. Readmitted with Hematemesis. He is followed by [facility]. Have place a call to [facility] to inquire as to what services he has. Will follow up   Action/Plan: Following progression.   [facility] Expected Discharge Date:                  Expected Discharge Plan:  Home w Home Health Services  In-House Referral:     Discharge planning Services  CM Consult  Post Acute Care Choice:  Home Health Choice offered to:     DME Arranged:    DME Agency:     HH Arranged:    HH Agency:  [facility]  Status of Service:  In process, will continue to follow  If discussed at Long Length of Stay Meetings, dates discussed:    Additional Comments:  Marily MemosLisa M Fleetwood Pierron, RN 04/06/2017, 9:44 AM

## 2017-04-06 NOTE — Consult Note (Signed)
Midge Minium, MD [facility]  [address], [address] [place], [place] [address] Phone: [phone] Fax : [phone]  Consultation  Referring Provider:     Dr. Imogene Burn Primary Care Physician:  [name], NP Primary Gastroenterologist:  Dr. Mechele Collin         Reason for Consultation:     Hematemesis  Date of Admission:  04/05/2017 Date of Consultation:  04/06/2017         HPI:   Howard [name] is a 36 y.o. male who was admitted with hematemesis. Please see my last note from earlier this month. At time the patient was in the ICU with a report of nausea vomiting with vomiting up of blood. The patient had a stable hemoglobin and was discharged home. The patient had a previous history of alcoholic cirrhosis and esophageal varices. The patient was started on octreotide which was then stopped when the patient had no further signs of nausea or vomiting and his hemoglobin was found to be stable. The patient was reported to have an EGD in the past with varices treated with banding with total eradication of the esophageal varices. The patient now reports that he had 3 episodes of vomiting blood with a small amount of blood which brought him to the hospital. The patient's hemoglobin 8 days ago was 12 with his hemoglobin on admission was 12.2 that was 11.7 on last check. Despite the patient telling me at the last admission that he was in a stop drinking he states he had a beer a few days ago.  Past Medical History:  Diagnosis Date  . Alcoholic cirrhosis of liver with ascites (HCC)   . Alcoholism (HCC)   . Anemia   . Atrial fibrillation (HCC)   . COPD (chronic obstructive pulmonary disease) (HCC)   . Depression   . Diabetes mellitus, type II (HCC)   . Esophageal varices (HCC)   . Heart disease    irregular heart beat (palpitations) and heart murmur  . Hyperlipemia   . Hypertension   . Liver disease   . Multiple thyroid nodules   . Portal hypertensive gastropathy (HCC)   . Schizophrenia (HCC)     . Seizures (HCC)     Past Surgical History:  Procedure Laterality Date  . ESOPHAGOGASTRODUODENOSCOPY N/A 10/05/2015   Procedure: ESOPHAGOGASTRODUODENOSCOPY (EGD);  Surgeon: Elnita Maxwell, MD;  Location: [facility] ENDOSCOPY;  Service: Endoscopy;  Laterality: N/A;  . ESOPHAGOGASTRODUODENOSCOPY (EGD) WITH PROPOFOL N/A 08/03/2015   Procedure: ESOPHAGOGASTRODUODENOSCOPY (EGD) WITH PROPOFOL;  Surgeon: Elnita Maxwell, MD;  Location[facility] ENDOSCOPY;  Service: Endoscopy;  Laterality: N/A;  . ESOPHAGOGASTRODUODENOSCOPY (EGD) WITH PROPOFOL N/A 08/31/2015   Procedure: ESOPHAGOGASTRODUODENOSCOPY (EGD) WITH PROPOFOL;  Surgeon: Elnita Maxwell, MD;  Location[facility] ENDOSCOPY;  Service: Endoscopy;  Laterality: N/A;  . ESOPHAGOGASTRODUODENOSCOPY (EGD) WITH PROPOFOL N/A 04/04/2016   Procedure: ESOPHAGOGASTRODUODENOSCOPY (EGD) WITH PROPOFOL;  Surgeon: Scot Jun, MD;  Location: [facility] ENDOSCOPY;  Service: Endoscopy;  Laterality: N/A;  . ESOPHAGOGASTRODUODENOSCOPY (EGD) WITH PROPOFOL N/A 11/13/2016   Procedure: ESOPHAGOGASTRODUODENOSCOPY (EGD) WITH PROPOFOL;  Surgeon: Wyline Mood, MD;  Location: [facility] ENDOSCOPY;  Service: Endoscopy;  Laterality: N/A;  . NO PAST SURGERIES      Prior to Admission medications   Medication Sig Start Date End Date Taking? Authorizing Provider  acamprosate (CAMPRAL) 333 MG tablet Take 2 tablets (666 mg total) by mouth 3 (three) times daily with meals. 03/24/17  Yes [name], [name], MD  albuterol (PROVENTIL HFA;VENTOLIN HFA) 108 (90 Base) MCG/ACT inhaler  Inhale 2 puffs into the lungs every 6 (six) hours as needed for wheezing or shortness of breath. 10/08/16  Yes Shaune Pollackhen, Qing, MD  docusate sodium (COLACE) 100 MG capsule Take 100 mg by mouth daily as needed for mild constipation.   Yes [provider]  ferrous sulfate 325 (65 FE) MG tablet Take 3 tablets (975 mg total) by mouth every Monday, Wednesday, and Friday. With breakfast 01/21/17  Yes Galen ManilaKennedy, [name], NP  folic  acid (FOLVITE) 1 MG tablet Take 1 tablet (1 mg total) by mouth daily. 12/19/16  Yes Gouru, Deanna ArtisAruna, MD  furosemide (LASIX) 20 MG tablet Take 20 mg by mouth daily.   Yes [provider]  lactulose (CHRONULAC) 10 GM/15ML solution Take 45 mLs (30 g total) by mouth every 4 (four) hours. Patient taking differently: Take 30 g by mouth 4 (four) times daily.  07/09/16  Yes Katharina CaperVaickute, Rima, MD  levETIRAcetam (KEPPRA) 750 MG tablet Take 2 tablets (1,500 mg total) by mouth 2 (two) times daily. 12/11/16 04/05/18 Yes Merrily Brittleifenbark, Neil, MD  losartan (COZAAR) 25 MG tablet Take 1 tablet (25 mg total) by mouth daily. 03/11/17  Yes Galen ManilaKennedy, [name], NP  lurasidone (LATUDA) 80 MG TABS tablet Take 1 tablet (80 mg total) by mouth daily with supper. 03/24/17  Yes [name], Jackquline DenmarkJohn T, MD  metFORMIN (GLUCOPHAGE) 500 MG tablet TAKE 1 TABLET BY MOUTH 2 TIMES DAILY WITH MEALS 03/26/17  Yes Galen ManilaKennedy, [name], NP  nadolol (CORGARD) 20 MG tablet Take 1 tablet (20 mg total) by mouth daily. 03/11/17  Yes Galen ManilaKennedy, [name], NP  omeprazole (PRILOSEC) 40 MG capsule Take 40 mg by mouth daily.   Yes [provider]  rifaximin (XIFAXAN) 550 MG TABS tablet Take 1 tablet (550 mg total) by mouth 2 (two) times daily. 07/09/16  Yes Katharina CaperVaickute, Rima, MD  spironolactone (ALDACTONE) 50 MG tablet Take 1 tablet (50 mg total) by mouth 2 (two) times daily. 03/02/17  Yes Galen ManilaKennedy, [name], NP  sucralfate (CARAFATE) 1 g tablet Take 1 tablet by mouth 4 (four) times daily.   Yes [provider]  thiamine (VITAMIN B-1) 100 MG tablet Take 100 mg by mouth daily.   Yes [provider]  traZODone (DESYREL) 100 MG tablet Take 1 tablet (100 mg total) by mouth at bedtime as needed for sleep. 03/24/17  Yes [name], Jackquline DenmarkJohn T, MD  esomeprazole (NEXIUM) 40 MG capsule Take 1 capsule (40 mg total) by mouth daily. Patient not taking: Reported on 04/05/2017 04/03/17 04/03/18  Charlynne PanderYao, David Hsienta, MD  [facility]     [provider]      Family History  Problem Relation Age of Onset  . Heart disease Mother   . Hypertension Mother   . Hyperlipidemia Mother   . Stroke Father   . Heart attack Father   . Hypertension Father   . Heart disease Father   . Alcohol abuse Father   . Heart disease Brother      Social History  Substance Use Topics  . Smoking status: Never Smoker  . Smokeless tobacco: Never Used  . Alcohol use 3.6 oz/week    6 Cans of beer per week     Comment: "one quart" 2-3 times per week    Allergies as of 04/05/2017 - Review Complete 04/05/2017  Allergen Reaction Noted  . Tramadol Itching 04/03/2017    Review of Systems:    All systems reviewed and negative except where noted in HPI.   Physical Exam:  Vital signs in last 24 hours: Temp:  [97.9 F (36.6 C)-98 F (36.7 C)] 98 F (36.7 C) (07/16 0930) Pulse Rate:  [60-74] 65 (07/16 0930) Resp:  [14-27] 20 (07/16 0930) BP: (106-142)/(57-94) 134/63 (07/16 0930) SpO2:  [91 %-97 %] 95 % (07/16 0930) Weight:  [348 lb (157.9 kg)] 348 lb (157.9 kg) (07/15 1635) Last BM Date: 04/04/17 General:   Pleasant, cooperative in NAD Head:  Normocephalic and atraumatic. Eyes:   No icterus.   Conjunctiva pink. PERRLA. Ears:  Normal auditory acuity. Neck:  Supple; no masses or thyroidomegaly Lungs: Respirations even and unlabored. Lungs clear to auscultation bilaterally.   No wheezes, crackles, or rhonchi.  Heart:  Regular rate and rhythm;  Without murmur, clicks, rubs or gallops Abdomen:  Soft, nondistended, nontender. Normal bowel sounds. No appreciable masses or hepatomegaly.  No rebound or guarding.  Rectal:  Not performed. Msk:  Symmetrical without gross deformities.  Strength  Extremities:  Without edema, cyanosis or clubbing. Neurologic:  Alert and oriented x3;  grossly normal neurologically. Skin:  Intact without significant lesions or rashes. Cervical Nodes:  No significant cervical adenopathy. Psych:  Alert and cooperative. Normal  affect.  LAB RESULTS:  Recent Labs  04/03/17 1851 04/05/17 1633 04/06/17 0414 04/06/17 1338  WBC 7.7 6.5 5.7  --   HGB 12.1* 12.2* 11.2* 11.7*  HCT 37.1* 37.5* 34.6*  --   PLT 143* 146* 110*  --    BMET  Recent Labs  04/03/17 1851 04/05/17 1633 04/06/17 0414  NA 137 139 140  K 4.3 4.4 4.2  CL 102 102 102  CO2 27 30 30   GLUCOSE 109* 89 90  BUN 15 14 16   CREATININE 1.20 1.13 1.15  CALCIUM 9.5 9.2 8.9   LFT  Recent Labs  04/05/17 1633  PROT 8.4*  ALBUMIN 3.7  AST 88*  ALT 54  ALKPHOS 110  BILITOT 1.0   PT/INR  Recent Labs  04/05/17 2038  LABPROT 15.3*  INR 1.20    STUDIES: No results found.    Impression / Plan:   Howard [name] is a 36 y.o. y/o male with A history of alcoholic liver disease 2 has had repeated admissions for vomiting blood. The patient has not had any witnessed nausea vomiting on any of her is admissions and his hemoglobin has remained stable. The patient will be set up for an EGD for tomorrow to look for any source of his hematemesis. I discussed with Dr. Imogene Burn that if this patient continues to common regularly with hematemesis and a stable hemoglobin the utility of repeat upper endoscopies are limited.  Thank you for involving me in the care of this patient.      LOS: 1 day   Midge Minium, MD  04/06/2017, 3:52 PM   Note: This dictation was prepared with Dragon dictation along with smaller phrase technology. Any transcriptional errors that result from this process are unintentional.

## 2017-04-06 NOTE — Progress Notes (Signed)
Initial Nutrition Assessment  DOCUMENTATION CODES:   Morbid obesity  INTERVENTION:  1. Monitor for diet advancement, provide supplements as indicated 2. Continue MVI w/ minerals, thiamine, folic acid  NUTRITION DIAGNOSIS:   Altered GI function (History of GI bleeds) related to chronic illness (ETOH abuse) as evidenced by per patient/family report.  GOAL:   Patient will meet greater than or equal to 90% of their needs  MONITOR:   I & O's, Diet advancement, Labs, Weight trends  REASON FOR ASSESSMENT:   Malnutrition Screening Tool    ASSESSMENT:   [name] has is a 36 yo male with a h/o ETOH abuse, alcoholic cirrhosis of liver with ascites, COPD, Depression, T2DM, Esophageal Varices, HTN, presents with hematemesis, potential GI bleed Spoke with [name] at bedside. He reports eating very small amounts due to nausea. Reports eating bread in the morning to take pills and okra later that day, but otherwise does not consume food unless he is drinking. States when he drinks that is when he likes to eat. Reports 14# wt loss over the past 3 months, insignificant for time frame. Nutrition-Focused physical exam completed. Findings are no fat depletion, no muscle depletion, and no edema.  Labs reviewed:  CBGs 129, 102 Elevated LFTs. Hgb 11.7 (up from 11.2 today earlier today)  Intake/Output Summary (Last 24 hours) at 04/06/17 1505 Last data filed at 04/06/17 1501  Gross per 24 hour  Intake              310 ml  Output             1600 ml  Net            -1290 ml  Medications reviewed and include:  Folic Acid, MVI, Thiamine Novolog 0-9 units Q6H NS @ 4275mL/hr  Diet Order:  Diet full liquid Room service appropriate? 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  Estimated Nutritional Needs:   Kcal:  2606-3100 calories (25-30cal/kg ABW)  Protein:  190-220 grams (1.2-1.4g/kg)  Fluid:  >/= 2.6L  EDUCATION NEEDS:   No education needs identified at this time  Dionne AnoWilliam M. Roda Lauture, MS, RD LDN Inpatient Clinical Dietitian Pager [phone]

## 2017-04-06 NOTE — Progress Notes (Signed)
Patient with new order for diet. Requested MD to change CBG to AC/HS as patient has eaten entire meal prior to scheduled CBG.

## 2017-04-06 NOTE — Progress Notes (Addendum)
[employer] - [place] at [facility]   PATIENT NAME: Howard [name]    MR#:  [id number]  DATE OF BIRTH:  April 04, 1981  SUBJECTIVE:  CHIEF COMPLAINT:   Chief Complaint  Patient presents with  . Hematemesis   No hematemesis. On O2 [place] 2L. REVIEW OF SYSTEMS:  Review of Systems  Constitutional: Positive for malaise/fatigue. Negative for chills and fever.  HENT: Negative for sore throat.   Eyes: Negative for blurred vision and double vision.  Respiratory: Negative for cough, shortness of breath and stridor.   Cardiovascular: Negative for chest pain and leg swelling.  Gastrointestinal: Negative for abdominal pain, blood in stool, diarrhea, melena, nausea and vomiting.  Genitourinary: Negative for dysuria and hematuria.  Musculoskeletal: Negative for back pain.  Neurological: Positive for weakness. Negative for dizziness, focal weakness, loss of consciousness and headaches.  Psychiatric/Behavioral: Negative for depression. The patient is not nervous/anxious.     DRUG ALLERGIES:   Allergies  Allergen Reactions  . Tramadol Itching   VITALS:  Blood pressure 134/63, pulse 65, temperature 98 F (36.7 C), temperature source Oral, resp. rate 20, height 6\' 3"  (1.905 m), weight (!) 348 lb (157.9 kg), SpO2 95 %. PHYSICAL EXAMINATION:  Physical Exam  Constitutional: He is oriented to person, place, and time.  Morbid obesity.  HENT:  Head: Normocephalic and atraumatic.  Eyes: Conjunctivae and EOM are normal. No scleral icterus.  Neck: Normal range of motion. Neck supple. No JVD present. No tracheal deviation present.  Cardiovascular: Normal rate, regular rhythm and normal heart sounds.  Exam reveals no gallop.   No murmur heard. Pulmonary/Chest: Effort normal and breath sounds normal. No respiratory distress. He has no wheezes. He has no rales.  Abdominal: Soft. Bowel sounds are normal. He exhibits no distension. There is tenderness.  Musculoskeletal: Normal range of  motion. He exhibits no edema or tenderness.  Neurological: He is alert and oriented to person, place, and time. No cranial nerve deficit.  Skin: No rash noted. No erythema.  Psychiatric: Affect normal.   LABORATORY PANEL:  Male CBC  Recent Labs Lab 04/06/17 0414 04/06/17 1338  WBC 5.7  --   HGB 11.2* 11.7*  HCT 34.6*  --   PLT 110*  --    ------------------------------------------------------------------------------------------------------------------ Chemistries   Recent Labs Lab 04/05/17 1633 04/06/17 0414  NA 139 140  K 4.4 4.2  CL 102 102  CO2 30 30  GLUCOSE 89 90  BUN 14 16  CREATININE 1.13 1.15  CALCIUM 9.2 8.9  AST 88*  --   ALT 54  --   ALKPHOS 110  --   BILITOT 1.0  --    RADIOLOGY:  No results found. ASSESSMENT AND PLAN:   Hematemesis - patient has a history of cirrhosis, but fairly recent EGD negative for varices. He reports 3 episodes of hematemesis over the last 24 hours. No active bleeding after admission.  hemoglobin decreased to 11.2.  discontinue Octreotide drip and IV Protonix. On protonix po bid.  Liquid diet today and EGD tomorrow per Dr. Lars Pinks.    Alcohol abuse. CIWA protocol   Alcoholic cirrhosis of liver with ascites (HCC) - avoid hepatotoxins   Controlled type 2 diabetes mellitus without complication (HCC) - sliding scale insulin with corresponding glucose checks   Essential (primary) hypertension - continue home meds Chronic respiratory failure. Continue O2  2L. NEB prn.  Discussed with Dr. Lars Pinks. All the records are reviewed and case discussed with Care Management/Social Worker. Management plans discussed with the  patient, family and they are in agreement.  CODE STATUS: Full Code  TOTAL TIME TAKING CARE OF THIS PATIENT: 35 minutes.   More than 50% of the time was spent in counseling/coordination of care: YES  POSSIBLE D/C IN 1-2 DAYS, DEPENDING ON CLINICAL CONDITION.   Howard Pollackhen, Howard [name] M.D on 04/06/2017 at 2:45 PM  Between 7am to  6pm - Pager - [phone]  After 6pm go to [email or web address] - password [email or web address]  [facility] [place] [facility]  Office  [phone]  CC: Primary care physician; Galen ManilaKennedy, Howard Renee, NP  Note: This dictation was prepared with Dragon dictation along with smaller phrase technology. Any transcriptional errors that result from this process are unintentional.

## 2017-04-06 NOTE — Progress Notes (Signed)
Consent obtained and placed in chart for EGD in am. Patient made aware of NPO status.

## 2017-04-07 ENCOUNTER — Telehealth: Payer: Self-pay | Admitting: Nurse Practitioner

## 2017-04-07 ENCOUNTER — Inpatient Hospital Stay: Payer: Medicare Other | Admitting: Registered Nurse

## 2017-04-07 ENCOUNTER — Encounter: Payer: Self-pay | Admitting: Anesthesiology

## 2017-04-07 ENCOUNTER — Ambulatory Visit: Payer: Medicare Other | Admitting: Psychiatry

## 2017-04-07 ENCOUNTER — Encounter: Payer: Self-pay | Admitting: Psychiatry

## 2017-04-07 ENCOUNTER — Telehealth: Payer: Self-pay

## 2017-04-07 ENCOUNTER — Ambulatory Visit: Payer: Medicare Other | Admitting: Licensed Clinical Social Worker

## 2017-04-07 ENCOUNTER — Encounter
Admission: EM | Disposition: A | Discharge status: Home / Self Care | Payer: Self-pay | Source: Home / Self Care | Attending: Internal Medicine

## 2017-04-07 ENCOUNTER — Ambulatory Visit (INDEPENDENT_AMBULATORY_CARE_PROVIDER_SITE_OTHER): Payer: Medicare Other | Admitting: Psychiatry

## 2017-04-07 VITALS — BP 130/80 | HR 80 | Temp 98.5°F

## 2017-04-07 DIAGNOSIS — F200 Paranoid schizophrenia: Secondary | ICD-10-CM | POA: Diagnosis not present

## 2017-04-07 DIAGNOSIS — I8500 Esophageal varices without bleeding: Secondary | ICD-10-CM

## 2017-04-07 DIAGNOSIS — F1010 Alcohol abuse, uncomplicated: Secondary | ICD-10-CM

## 2017-04-07 DIAGNOSIS — K920 Hematemesis: Principal | ICD-10-CM

## 2017-04-07 HISTORY — PX: ESOPHAGOGASTRODUODENOSCOPY (EGD) WITH PROPOFOL: SHX5813

## 2017-04-07 LAB — GLUCOSE, CAPILLARY: GLUCOSE-CAPILLARY: 96 mg/dL (ref 65–99)

## 2017-04-07 SURGERY — ESOPHAGOGASTRODUODENOSCOPY (EGD) WITH PROPOFOL
Anesthesia: General

## 2017-04-07 MED ORDER — MIDAZOLAM HCL 2 MG/2ML IJ SOLN
INTRAMUSCULAR | Status: DC | PRN
  Administered 2017-04-07 (×2): 1 mg via INTRAVENOUS

## 2017-04-07 MED ORDER — SODIUM CHLORIDE 0.9 % IV SOLN
INTRAVENOUS | Status: DC
  Administered 2017-04-07: 1000 mL via INTRAVENOUS

## 2017-04-07 MED ORDER — PROPOFOL 500 MG/50ML IV EMUL
INTRAVENOUS | Status: DC | PRN
  Administered 2017-04-07: 150 ug/kg/min via INTRAVENOUS

## 2017-04-07 MED ORDER — LURASIDONE HCL 40 MG PO TABS
80.0000 mg | ORAL_TABLET | Freq: Every day | ORAL | Status: DC
  Filled 2017-04-07: qty 2

## 2017-04-07 MED ORDER — PROPOFOL 10 MG/ML IV BOLUS
INTRAVENOUS | Status: DC | PRN
  Administered 2017-04-07: 80 mg via INTRAVENOUS

## 2017-04-07 MED ORDER — PROPOFOL 10 MG/ML IV BOLUS
INTRAVENOUS | Status: AC
  Filled 2017-04-07: qty 20

## 2017-04-07 MED ORDER — IPRATROPIUM-ALBUTEROL 0.5-2.5 (3) MG/3ML IN SOLN
3.0000 mL | Freq: Four times a day (QID) | RESPIRATORY_TRACT | Status: DC
  Administered 2017-04-07: 3 mL via RESPIRATORY_TRACT

## 2017-04-07 MED ORDER — GLYCOPYRROLATE 0.2 MG/ML IJ SOLN
INTRAMUSCULAR | Status: AC
  Filled 2017-04-07: qty 1

## 2017-04-07 MED ORDER — IPRATROPIUM-ALBUTEROL 0.5-2.5 (3) MG/3ML IN SOLN
RESPIRATORY_TRACT | Status: AC
  Administered 2017-04-07: 3 mL via RESPIRATORY_TRACT
  Filled 2017-04-07: qty 3

## 2017-04-07 MED ORDER — MIDAZOLAM HCL 2 MG/2ML IJ SOLN
INTRAMUSCULAR | Status: AC
  Filled 2017-04-07: qty 2

## 2017-04-07 MED ORDER — GLYCOPYRROLATE 0.2 MG/ML IJ SOLN
INTRAMUSCULAR | Status: DC | PRN
  Administered 2017-04-07: 0.2 mg via INTRAVENOUS

## 2017-04-07 MED ORDER — FLUMAZENIL 0.5 MG/5ML IV SOLN
INTRAVENOUS | Status: AC
  Filled 2017-04-07: qty 5

## 2017-04-07 MED ORDER — LIDOCAINE HCL (CARDIAC) 20 MG/ML IV SOLN
INTRAVENOUS | Status: DC | PRN
  Administered 2017-04-07: 40 mg via INTRAVENOUS

## 2017-04-07 MED ORDER — FLUMAZENIL 0.5 MG/5ML IV SOLN
INTRAVENOUS | Status: DC | PRN
  Administered 2017-04-07: 0.5 mg via INTRAVENOUS

## 2017-04-07 MED ORDER — LIDOCAINE HCL (PF) 2 % IJ SOLN
INTRAMUSCULAR | Status: AC
  Filled 2017-04-07: qty 2

## 2017-04-07 NOTE — Telephone Encounter (Signed)
[facility] doctor [facility] spoke with nurse Morrie Sheldonashley.  not a patient there. but has seen linthavong and that maybe he handles the oxygen or would know.  I was switched to dr. Burnadette PopLinthavong office at [facility] and per the nurse pt transferred to Wilhelmina McardleLauren [name], NP [phone].

## 2017-04-07 NOTE — Op Note (Signed)
[facility] Gastroenterology Patient Name: Howard BowlerJoey Bastyr Procedure Date: 04/07/2017 9:28 AM MRN: [id number] Account #: [id number] Date of Birth: Mar 10, 1981 Admit Type: Inpatient Age: [age] [facility]OM 4 Gender: Male Note Status: Finalized Procedure:            Upper GI endoscopy Indications:          Hematemesis Providers:            Midge Miniumarren Floree Zuniga MD, MD Referring MD:         Galen ManilaKennedy, [name] Medicines:            Propofol per Anesthesia Complications:        No immediate complications. Procedure:            Pre-Anesthesia Assessment:                       - Prior to the procedure, a History and Physical was                        performed, and patient medications and allergies were                        reviewed. The patient's tolerance of previous                        anesthesia was also reviewed. The risks and benefits of                        the procedure and the sedation options and risks were                        discussed with the patient. All questions were                        answered, and informed consent was obtained. Prior                        Anticoagulants: The patient has taken no previous                        anticoagulant or antiplatelet agents. ASA Grade                        Assessment: II - A patient with mild systemic disease.                        After reviewing the risks and benefits, the patient was                        deemed in satisfactory condition to undergo the                        procedure.                       After obtaining informed consent, the endoscope was                        passed under direct vision. Throughout the procedure,  the patient's blood pressure, pulse, and oxygen                        saturations were monitored continuously. The Endoscope                        was introduced through the mouth, and advanced to the                        second part of  duodenum. The upper GI endoscopy was                        accomplished without difficulty. The patient tolerated                        the procedure well. Findings:      Grade I varices were found in the lower third of the esophagus.      Diffuse mild inflammation characterized by erythema was found in the       entire examined stomach.      The examined duodenum was normal. Impression:           - Grade I esophageal varices.                       - Gastritis.                       - Normal examined duodenum.                       - No specimens collected. Recommendation:       - Return patient to hospital ward for ongoing care.                       - Resume previous diet.                       - Continue present medications.                       - Patient should be started on Nadolol. Procedure Code(s):    --- Professional ---                       [phone], Esophagogastroduodenoscopy, flexible, transoral;                        diagnostic, including collection of specimen(s) by                        brushing or washing, when performed (separate procedure) Diagnosis Code(s):    --- Professional ---                       K92.0, Hematemesis                       I85.00, Esophageal varices without bleeding                       K29.70, Gastritis, unspecified, without bleeding CPT copyright 2016 American Medical Association. All rights reserved. The codes documented in this report are preliminary and upon coder review may  be  revised to meet current compliance requirements. Midge Minium MD, MD 04/07/2017 9:50:44 AM This report has been signed electronically. Number of Addenda: 0 Note Initiated On: 04/07/2017 9:28 AM      [facility]

## 2017-04-07 NOTE — Anesthesia Post-op Follow-up Note (Cosign Needed)
Anesthesia QCDR form completed.        

## 2017-04-07 NOTE — Transfer of Care (Signed)
Immediate Anesthesia Transfer of Care Note  Patient: Howard [name]  Procedure(s) Performed: Procedure(s): ESOPHAGOGASTRODUODENOSCOPY (EGD) WITH PROPOFOL (N/A)  Patient Location: PACU and Endoscopy Unit  Anesthesia Type:General  Level of Consciousness: sedated  Airway & Oxygen Therapy: Patient Spontanous Breathing and Patient connected to nasal cannula oxygen  Post-op Assessment: Report given to RN and Post -op Vital signs reviewed and stable  Post vital signs: Reviewed and stable  Last Vitals:  Vitals:   04/07/17 0907 04/07/17 1010  BP: 139/78 96/63  Pulse: 63 80  Resp: 18 18  Temp: 36.5 C     Complications: No apparent anesthesia complications

## 2017-04-07 NOTE — Anesthesia Postprocedure Evaluation (Signed)
Anesthesia Post Note  Patient: Howard [name]  Procedure(s) Performed: Procedure(s) (LRB): ESOPHAGOGASTRODUODENOSCOPY (EGD) WITH PROPOFOL (N/A)  Patient location during evaluation: Endoscopy Anesthesia Type: General Level of consciousness: awake and alert Pain management: pain level controlled Vital Signs Assessment: post-procedure vital signs reviewed and stable Respiratory status: spontaneous breathing, nonlabored ventilation, respiratory function stable and patient connected to nasal cannula oxygen Cardiovascular status: blood pressure returned to baseline and stable Postop Assessment: no signs of nausea or vomiting Anesthetic complications: no     Last Vitals:  Vitals:   04/07/17 1020 04/07/17 1030  BP: 119/73 105/70  Pulse: 78 77  Resp: 16 (!) 23  Temp:      Last Pain:  Vitals:   04/07/17 0907  TempSrc: Tympanic  PainSc:                  Lenard SimmerAndrew Gabriellah Rabel

## 2017-04-07 NOTE — Telephone Encounter (Signed)
Thanks very much. We spoke about this. Appreciate your help.

## 2017-04-07 NOTE — Telephone Encounter (Signed)
The pt was scheduled for this Thursday @ 1:20pm.

## 2017-04-07 NOTE — Discharge Instructions (Signed)
Alcohol abuse cessation. Heart healthy and ADA diet.

## 2017-04-07 NOTE — Discharge Summary (Signed)
[employer] [facility] [place] [facility]   PATIENT NAME: Howard [name]    MR#:  [id number]  DATE OF BIRTH:  05/09/1981  DATE OF ADMISSION:  04/05/2017   ADMITTING PHYSICIAN: Oralia Manis, MD  DATE OF DISCHARGE: 04/07/2017 PRIMARY CARE PHYSICIAN: [name], NP   ADMISSION DIAGNOSIS:  Hematemesis with nausea [K92.0] Alcoholic cirrhosis, unspecified whether ascites present (HCC) [K70.30] DISCHARGE DIAGNOSIS:  Principal Problem:   Hematemesis Active Problems:   Alcohol abuse   Alcoholic cirrhosis of liver with ascites (HCC)   Controlled type 2 diabetes mellitus without complication (HCC)   Essential (primary) hypertension   Esophageal varices without bleeding (HCC)  SECONDARY DIAGNOSIS:   Past Medical History:  Diagnosis Date  . Alcoholic cirrhosis of liver with ascites (HCC)   . Alcoholism (HCC)   . Anemia   . Atrial fibrillation (HCC)   . COPD (chronic obstructive pulmonary disease) (HCC)   . Depression   . Diabetes mellitus, type II (HCC)   . Esophageal varices (HCC)   . Heart disease    irregular heart beat (palpitations) and heart murmur  . Hyperlipemia   . Hypertension   . Liver disease   . Multiple thyroid nodules   . Portal hypertensive gastropathy (HCC)   . Schizophrenia (HCC)   . Seizures [facility])    HOSPITAL COURSE:  Hematemesis - patient has a history of cirrhosis, but fairly recent EGD negative for varices. He reported 3 episodes of hematemesis over 24 hours. No active bleeding after admission.  hemoglobin is stable at 11.7.  discontinued Octreotide drip and IV Protonix. On protonix po bid.  EGD showed grade 1 varices and gastritis per Dr. Servando Snare. Dr. Servando Snare suggests nadolol, which the patient is already on.  Alcohol abuse. CIWA protocol Alcoholic cirrhosis of liver with ascites (HCC) - avoid hepatotoxins Controlled type 2 diabetes mellitus without complication (HCC) - sliding scale insulin with corresponding glucose  checks Essential (primary) hypertension - continue home meds Chronic respiratory failure. Continue O2 [place] 2L. NEB prn. Morbid obesity. Discussed with Dr. Lars Pinks. DISCHARGE CONDITIONS:  Stable, discharge to home with home health and PT today. CONSULTS OBTAINED:  Treatment Team:  Wyline Mood, MD DRUG ALLERGIES:   Allergies  Allergen Reactions  . Tramadol Itching   DISCHARGE MEDICATIONS:   Allergies as of 04/07/2017      Reactions   Tramadol Itching      Medication List    TAKE these medications   acamprosate 333 MG tablet Commonly known as:  CAMPRAL Take 2 tablets (666 mg total) by mouth 3 (three) times daily with meals.   albuterol 108 (90 Base) MCG/ACT inhaler Commonly known as:  PROVENTIL HFA;VENTOLIN HFA Inhale 2 puffs into the lungs every 6 (six) hours as needed for wheezing or shortness of breath.   docusate sodium 100 MG capsule Commonly known as:  COLACE Take 100 mg by mouth daily as needed for mild constipation.   esomeprazole 40 MG capsule Commonly known as:  NEXIUM Take 1 capsule (40 mg total) by mouth daily.   ferrous sulfate 325 (65 FE) MG tablet Take 3 tablets (975 mg total) by mouth every Monday, Wednesday, and Friday. With breakfast   folic acid 1 MG tablet Commonly known as:  FOLVITE Take 1 tablet (1 mg total) by mouth daily.   furosemide 20 MG tablet Commonly known as:  LASIX Take 20 mg by mouth daily.   lactulose 10 GM/15ML solution Commonly known as:  CHRONULAC Take 45 mLs (30 g total)  by mouth every 4 (four) hours. What changed:  when to take this   levETIRAcetam 750 MG tablet Commonly known as:  KEPPRA Take 2 tablets (1,500 mg total) by mouth 2 (two) times daily.   losartan 25 MG tablet Commonly known as:  COZAAR Take 1 tablet (25 mg total) by mouth daily.   lurasidone 80 MG Tabs tablet Commonly known as:  LATUDA Take 1 tablet (80 mg total) by mouth daily with supper.   metFORMIN 500 MG tablet Commonly known as:   GLUCOPHAGE TAKE 1 TABLET BY MOUTH 2 TIMES DAILY WITH MEALS   nadolol 20 MG tablet Commonly known as:  CORGARD Take 1 tablet (20 mg total) by mouth daily.   NON FORMULARY   omeprazole 40 MG capsule Commonly known as:  PRILOSEC Take 40 mg by mouth daily.   rifaximin 550 MG Tabs tablet Commonly known as:  XIFAXAN Take 1 tablet (550 mg total) by mouth 2 (two) times daily.   spironolactone 50 MG tablet Commonly known as:  ALDACTONE Take 1 tablet (50 mg total) by mouth 2 (two) times daily.   sucralfate 1 g tablet Commonly known as:  CARAFATE Take 1 tablet by mouth 4 (four) times daily.   thiamine 100 MG tablet Commonly known as:  VITAMIN B-1 Take 100 mg by mouth daily.   traZODone 100 MG tablet Commonly known as:  DESYREL Take 1 tablet (100 mg total) by mouth at bedtime as needed for sleep.        DISCHARGE INSTRUCTIONS:  See AVS.  If you experience worsening of your admission symptoms, develop shortness of breath, life threatening emergency, suicidal or homicidal thoughts you must seek medical attention immediately by calling 911 or calling your MD immediately  if symptoms less severe.  You Must read complete instructions/literature along with all the possible adverse reactions/side effects for all the Medicines you take and that have been prescribed to you. Take any new Medicines after you have completely understood and accpet all the possible adverse reactions/side effects.   Please note  You were cared for by a hospitalist during your hospital stay. If you have any questions about your discharge medications or the care you received while you were in the hospital after you are discharged, you can call the unit and asked to speak with the hospitalist on call if the hospitalist that took care of you is not available. Once you are discharged, your primary care physician will handle any further medical issues. Please note that NO REFILLS for any discharge medications will be  authorized once you are discharged, as it is imperative that you return to your primary care physician (or establish a relationship with a primary care physician if you do not have one) for your aftercare needs so that they can reassess your need for medications and monitor your lab values.    On the day of Discharge:  VITAL SIGNS:  Blood pressure 105/70, pulse 77, temperature 97.7 F (36.5 C), temperature source Tympanic, resp. rate (!) 23, height 6\' 3"  (1.905 m), weight (!) 348 lb (157.9 kg), SpO2 95 %. PHYSICAL EXAMINATION:  GENERAL:  36 y.o.-year-old patient lying in the bed with no acute distress. Morbid obesity. EYES: Pupils equal, round, reactive to light and accommodation. No scleral icterus. Extraocular muscles intact.  HEENT: Head atraumatic, normocephalic. Oropharynx and nasopharynx clear.  NECK:  Supple, no jugular venous distention. No thyroid enlargement, no tenderness.  LUNGS: Normal breath sounds bilaterally, no wheezing, rales,rhonchi or crepitation. No use of  accessory muscles of respiration.  CARDIOVASCULAR: S1, S2 normal. No murmurs, rubs, or gallops.  ABDOMEN: Soft, non-tender, non-distended. Bowel sounds present. No organomegaly or mass.  EXTREMITIES: No pedal edema, cyanosis, or clubbing.  NEUROLOGIC: Cranial nerves II through XII are intact. Muscle strength 5/5 in all extremities. Sensation intact. Gait not checked.  PSYCHIATRIC: The patient is alert and oriented x 3.  SKIN: No obvious rash, lesion, or ulcer.  DATA REVIEW:   CBC  Recent Labs Lab 04/06/17 0414 04/06/17 1338  WBC 5.7  --   HGB 11.2* 11.7*  HCT 34.6*  --   PLT 110*  --     Chemistries   Recent Labs Lab 04/05/17 1633 04/06/17 0414  NA 139 140  K 4.4 4.2  CL 102 102  CO2 30 30  GLUCOSE 89 90  BUN 14 16  CREATININE 1.13 1.15  CALCIUM 9.2 8.9  AST 88*  --   ALT 54  --   ALKPHOS 110  --   BILITOT 1.0  --      Microbiology Results  Results for orders placed or performed during  the hospital encounter of 03/25/17  MRSA PCR Screening     Status: None   Collection Time: 03/26/17  8:30 AM  Result Value Ref Range Status   MRSA by PCR NEGATIVE NEGATIVE Final    Comment:        The GeneXpert MRSA Assay (FDA approved for NASAL specimens only), is one component of a comprehensive MRSA colonization surveillance program. It is not intended to diagnose MRSA infection nor to guide or monitor treatment for MRSA infections.     RADIOLOGY:  No results found.   Management plans discussed with the patient, family and they are in agreement.  CODE STATUS: Full Code   TOTAL TIME TAKING CARE OF THIS PATIENT: 33 minutes.    Shaune Pollack M.D on 04/07/2017 at 1:09 PM  Between 7am to 6pm - Pager - [phone]  After 6pm go to [email or web address] - password [email or web address] [facility]  [employer] [facility] [place] [facility]  Office  [phone]  CC: Primary care physician; [name], NP   Note: This dictation was prepared with Dragon dictation along with smaller phrase technology. Any transcriptional errors that result from this process are unintentional.

## 2017-04-07 NOTE — Anesthesia Preprocedure Evaluation (Signed)
Anesthesia Evaluation  Patient identified by MRN, date of birth, ID band Patient awake    Reviewed: Allergy & Precautions, H&P , NPO status , Patient's Chart, lab work & pertinent test results, reviewed documented beta blocker date and time   History of Anesthesia Complications Negative for: history of anesthetic complications  Airway Mallampati: IV  TM Distance: >3 FB Neck ROM: full    Dental  (+) Chipped, Missing, Poor Dentition, Dental Advidsory Given   Pulmonary shortness of breath, sleep apnea and Oxygen sleep apnea , COPD, neg recent URI,           Cardiovascular Exercise Tolerance: Good hypertension, On Medications + angina (relieved with antacids) (-) CAD, (-) Past MI, (-) Cardiac Stents and (-) CABG + dysrhythmias + Valvular Problems/Murmurs      Neuro/Psych Seizures -,  PSYCHIATRIC DISORDERS (Schizophrenia)    GI/Hepatic GERD  ,(+) Cirrhosis   Esophageal Varices and ascites  substance abuse  alcohol use,   Endo/Other  diabetesMorbid obesity  Renal/GU negative Renal ROS  negative genitourinary   Musculoskeletal   Abdominal   Peds  Hematology  (+) Blood dyscrasia, anemia ,   Anesthesia Other Findings Past Medical History: No date: Alcoholic cirrhosis of liver with ascites (HCC) No date: Alcoholism (HCC) No date: Anemia No date: Atrial fibrillation (HCC) No date: COPD (chronic obstructive pulmonary disease) (HCC) No date: Depression No date: Diabetes mellitus, type II (HCC) No date: Esophageal varices (HCC) No date: Heart disease     Comment:  irregular heart beat (palpitations) and heart murmur No date: Hyperlipemia No date: Hypertension No date: Liver disease No date: Multiple thyroid nodules No date: Portal hypertensive gastropathy (HCC) No date: Schizophrenia (HCC) No date: Seizures (HCC)   Reproductive/Obstetrics negative OB ROS                              Anesthesia Physical Anesthesia Plan  ASA: III  Anesthesia Plan: General   Post-op Pain Management:    Induction: Intravenous  PONV Risk Score and Plan: 2 and Ondansetron and Dexamethasone  Airway Management Planned: Natural Airway and Nasal Cannula  Additional Equipment:   Intra-op Plan:   Post-operative Plan:   Informed Consent: I have reviewed the patients History and Physical, chart, labs and discussed the procedure including the risks, benefits and alternatives for the proposed anesthesia with the patient or authorized representative who has indicated his/her understanding and acceptance.   Dental Advisory Given  Plan Discussed with: Anesthesiologist, CRNA and Surgeon  Anesthesia Plan Comments:         Anesthesia Quick Evaluation

## 2017-04-07 NOTE — Progress Notes (Signed)
Follow-up for patient was schizophrenia and alcohol abuse. He has been back in the hospital again for a few days after once again throwing up blood as a result of his alcohol abuse. Patient tells me that he is feeling a little better although he is quite weak. On his evaluation by nursing today he was found to have a oxygen level of only 85 on his pulse ox presumably from ambulating around. Patient says his mood remains somewhat down but not suicidal. Hallucinations are only occasional. He admits that he had some alcohol to drink the night that he threw up although he claims he had been sober for a week prior to that.  Somewhat disheveled gentleman looks his stated age cooperative and pleasant. Constricted affect. Not suicidal. Not overtly psychotic. Reasonably good insight.  Spent some time with him as usual going over the obvious fact that his drinking is killing him. Try and offer lots of support and discussion of other things he can do to make himself feel better. For now I would not change his medicine. He thinks he may not of been getting all of his antipsychotic while he was in the hospital. He is to continue on what he is taking regularly including his Latuda when he gets home. I will see him back in another couple weeks.

## 2017-04-07 NOTE — Telephone Encounter (Signed)
Pt said psychiatrist advise him to call to let Leotis ShamesLauren know his oxygen is low 84 and he was just discharged from hospital [phone]

## 2017-04-07 NOTE — Anesthesia Procedure Notes (Signed)
Date/Time: 04/07/2017 9:38 AM Performed by: Stormy FabianURTIS, Arijana Narayan Pre-anesthesia Checklist: Patient identified, Emergency Drugs available, Suction available and Patient being monitored Patient Re-evaluated:Patient Re-evaluated prior to induction Oxygen Delivery Method: Nasal cannula Induction Type: IV induction Dental Injury: Teeth and Oropharynx as per pre-operative assessment  Comments: Nasal cannula with etCO2 monitoring

## 2017-04-07 NOTE — Telephone Encounter (Signed)
pt came into office today with appt with dr. Toni Amendclapacs. pt stated that he did not feel well and that he felt short of breath. pt also stated that he just got out of the hospital. pt still had wrist bands on. pt was on oxygen. pt o2 was check and it was 85 on 2 litter of o2.  Pt was ask if he had a pulmonary doctor and he said he thought he seen someone in [facility].  Spoke with dr. Toni Amendclapacs to let him be aware of the oxygen. He wanted me to contact doctor and notify of issue.

## 2017-04-07 NOTE — Telephone Encounter (Signed)
Discussed w/ Dr. Toni Amendlapacs' assistant.    Pt's skin coloring slightly dusky, diaphoretic, and wearing O2.  SpO2 - 85% on 2L in office.   Pt told them he had not recently been to pulmonology.  Instructions provided to increase to 3L nasal cannula if needed until can be seen here.  Please schedule appointment at next opening.

## 2017-04-07 NOTE — Telephone Encounter (Signed)
spoke with Mrs. Howard [name] < NP let her know that pt was in the office today for a follow up and that pt o2 was at 85 $ with 2 litter of oxygen.  she wanted me to advise pt to call her office and make an appt to be seen and he can also increase o2 to 3 litter and to check on oxygen levels.

## 2017-04-07 NOTE — Telephone Encounter (Signed)
pt was called and told that I had spoken with his PCP and that she wanted him to call her office and set up an appt to be seen and that he could increase his o2 to 3 litter and for him to keep a check on it.   Pt repeated what I had told him so that he understood what to do. Pt was also told that I would call him tomorrow to get when he is suppose to see Mrs. Alvester ChouKennedy<NP and to make sure that he was feeling better.  Pt understood that he was to call pcp office and make and appt and to increase o2 to 3 litters.

## 2017-04-08 ENCOUNTER — Emergency Department
Admission: EM | Admit: 2017-04-08 | Discharge: 2017-04-08 | Disposition: A | Discharge status: Home / Self Care | Payer: Medicare Other | Attending: Emergency Medicine | Admitting: Emergency Medicine

## 2017-04-08 ENCOUNTER — Encounter: Payer: Self-pay | Admitting: Gastroenterology

## 2017-04-08 DIAGNOSIS — Z79899 Other long term (current) drug therapy: Secondary | ICD-10-CM | POA: Diagnosis not present

## 2017-04-08 DIAGNOSIS — R600 Localized edema: Secondary | ICD-10-CM | POA: Diagnosis present

## 2017-04-08 DIAGNOSIS — Z7984 Long term (current) use of oral hypoglycemic drugs: Secondary | ICD-10-CM | POA: Diagnosis not present

## 2017-04-08 DIAGNOSIS — I10 Essential (primary) hypertension: Secondary | ICD-10-CM | POA: Insufficient documentation

## 2017-04-08 DIAGNOSIS — J029 Acute pharyngitis, unspecified: Secondary | ICD-10-CM

## 2017-04-08 DIAGNOSIS — E119 Type 2 diabetes mellitus without complications: Secondary | ICD-10-CM | POA: Diagnosis not present

## 2017-04-08 DIAGNOSIS — J449 Chronic obstructive pulmonary disease, unspecified: Secondary | ICD-10-CM | POA: Insufficient documentation

## 2017-04-08 MED ORDER — IBUPROFEN 600 MG PO TABS: 600.0000 mg | ORAL_TABLET | Freq: Three times a day (TID) | ORAL | 0 refills | Status: DC | PRN

## 2017-04-08 NOTE — Discharge Instructions (Signed)
Please take ibuprofen up to 3 times a day as needed for fever and sore throat. It can be very helpful to our goal warm saltwater as well. It is normal for her swelling to last a full 4-5 days.

## 2017-04-08 NOTE — ED Notes (Signed)
Pt discharged to home.  Family member driving.  Discharge instructions reviewed.  Verbalized understanding.  No questions or concerns at this time.  Teach back verified.  Pt in NAD.  No items left in ED.   

## 2017-04-08 NOTE — ED Provider Notes (Signed)
[facility] Emergency Department Provider Note  ____________________________________________   None    (approximate)  I have reviewed the triage vital signs and the nursing notes.   HISTORY  Chief Complaint Facial Swelling    HPI Howard [name] is a 36 y.o. male who comes to the emergency department with 1 day of swelling under his bilateral jaw. He feels like the swelling began on the left and now on the right. He reports subjective fever and mild sore throat. He denies chest pain or shortness of breath. He denies rhinorrhea. He has a complex past medical history including schizophrenia and alcohol abuse and he reports compliance with all of his medications. Trauma. He does have mild aching discomfort in his throat worse when swallowing improved when not swallowing.   Past Medical History:  Diagnosis Date  . Alcoholic cirrhosis of liver with ascites (HCC)   . Alcoholism (HCC)   . Anemia   . Atrial fibrillation (HCC)   . COPD (chronic obstructive pulmonary disease) (HCC)   . Depression   . Diabetes mellitus, type II (HCC)   . Esophageal varices (HCC)   . Heart disease    irregular heart beat (palpitations) and heart murmur  . Hyperlipemia   . Hypertension   . Liver disease   . Multiple thyroid nodules   . Portal hypertensive gastropathy (HCC)   . Schizophrenia (HCC)   . Seizures [facility](HCC)     Patient Active Problem List   Diagnosis Date Noted  . Esophageal varices without bleeding (HCC)   . Hematemesis 04/05/2017  . Upper GI bleed   . Iron deficiency anemia due to chronic blood loss 02/16/2017  . Nausea 01/18/2017  . Respiratory failure with hypoxia (HCC) 12/14/2016  . Seizures (HCC) 12/12/2016  . DNR (do not resuscitate) discussion 08/11/2016  . Muscle weakness (generalized)   . GI bleed 08/07/2016  . OSA (obstructive sleep apnea) 04/29/2016  . Anemia 04/29/2016  . Thrombocytopenia (HCC) 04/29/2016  . Coagulopathy (HCC) 04/29/2016  .  Obesity 04/29/2016  . Controlled type 2 diabetes mellitus without complication (HCC) 01/15/2016  . Essential (primary) hypertension 12/11/2015  . Pure hypercholesterolemia 12/11/2015  . Hepatic encephalopathy (HCC) 10/16/2015  . 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 Elevated transaminase level 07/04/2015  . Alcohol abuse 05/31/2015  . Paranoid schizophrenia (HCC) 03/20/2015    Past Surgical History:  Procedure Laterality Date  . ESOPHAGOGASTRODUODENOSCOPY N/A 10/05/2015   Procedure: ESOPHAGOGASTRODUODENOSCOPY (EGD);  Surgeon: Elnita MaxwellMatthew Gordon Rein, MD;  [facility];  Service: Endoscopy;  Laterality: N/A;  . ESOPHAGOGASTRODUODENOSCOPY (EGD) WITH PROPOFOL N/A 08/03/2015   Procedure: ESOPHAGOGASTRODUODENOSCOPY (EGD) WITH PROPOFOL;  Surgeon: Elnita MaxwellMatthew Gordon Rein, MD;  [facility];  Service: Endoscopy;  Laterality: N/A;  . ESOPHAGOGASTRODUODENOSCOPY (EGD) WITH PROPOFOL N/A 08/31/2015   Procedure: ESOPHAGOGASTRODUODENOSCOPY (EGD) WITH PROPOFOL;  Surgeon: Elnita MaxwellMatthew Gordon Rein, MD;  [facility];  Service: Endoscopy;  Laterality: N/A;  . ESOPHAGOGASTRODUODENOSCOPY (EGD) WITH PROPOFOL N/A 04/04/2016   Procedure: ESOPHAGOGASTRODUODENOSCOPY (EGD) WITH PROPOFOL;  Surgeon: Scot Junobert T Elliott, MD;  [facility];  Service: Endoscopy;  Laterality: N/A;  . ESOPHAGOGASTRODUODENOSCOPY (EGD) WITH PROPOFOL N/A 11/13/2016   Procedure: ESOPHAGOGASTRODUODENOSCOPY (EGD) WITH PROPOFOL;  Surgeon: Wyline MoodKiran Anna, MD;  Location: [facility] ENDOSCOPY;  Service: Endoscopy;  Laterality: N/A;  . ESOPHAGOGASTRODUODENOSCOPY (EGD) WITH PROPOFOL N/A 04/07/2017   Procedure: ESOPHAGOGASTRODUODENOSCOPY (EGD) WITH PROPOFOL;  Surgeon: Midge MiniumWohl, Darren, MD;  Location: [facility] ENDOSCOPY;  Service: Endoscopy;  Laterality: N/A;  . NO PAST SURGERIES  Prior to Admission medications   Medication Sig Start Date End Date Taking? Authorizing Provider  acamprosate (CAMPRAL) 333 MG tablet  Take 2 tablets (666 mg total) by mouth 3 (three) times daily with meals. 03/24/17   [name], [name], MD  albuterol (PROVENTIL HFA;VENTOLIN HFA) 108 (90 Base) MCG/ACT inhaler Inhale 2 puffs into the lungs every 6 (six) hours as needed for wheezing or shortness of breath. 10/08/16   Shaune Pollack, MD  clotrimazole (LOTRIMIN) 1 % external solution Apply 3-4 drops to each ear two times per day for 10 days. 04/09/17   [name], NP  docusate sodium (COLACE) 100 MG capsule Take 100 mg by mouth daily as needed for mild constipation.    [provider]  esomeprazole (NEXIUM) 40 MG capsule Take 1 capsule (40 mg total) by mouth daily. 04/03/17 04/03/18  Charlynne Pander, MD  ferrous sulfate 325 (65 FE) MG tablet Take 3 tablets (975 mg total) by mouth every Monday, Wednesday, and Friday. With breakfast 01/21/17   [name], NP  folic acid (FOLVITE) 1 MG tablet Take 1 tablet (1 mg total) by mouth daily. 12/19/16   Gouru, Deanna Artis, MD  furosemide (LASIX) 20 MG tablet Take 20 mg by mouth daily.    [provider]  ibuprofen (ADVIL,MOTRIN) 600 MG tablet Take 1 tablet (600 mg total) by mouth every 8 (eight) hours as needed. 04/08/17   Merrily Brittle, MD  lactulose (CHRONULAC) 10 GM/15ML solution Take 45 mLs (30 g total) by mouth every 4 (four) hours. Patient taking differently: Take 30 g by mouth 4 (four) times daily.  07/09/16   Katharina Caper, MD  levETIRAcetam (KEPPRA) 750 MG tablet Take 2 tablets (1,500 mg total) by mouth 2 (two) times daily. 12/11/16 04/05/18  Merrily Brittle, MD  losartan (COZAAR) 25 MG tablet Take 1 tablet (25 mg total) by mouth daily. 03/11/17   [name], NP  lurasidone (LATUDA) 80 MG TABS tablet Take 1 tablet (80 mg total) by mouth daily with supper. 03/24/17   [name], [name], MD  metFORMIN (GLUCOPHAGE) 500 MG tablet TAKE 1 TABLET BY MOUTH 2 TIMES DAILY WITH MEALS 03/26/17   [name], NP  nadolol (CORGARD) 20 MG tablet Take 1 tablet (20 mg  total) by mouth daily. 03/11/17   [name], NP  NON FORMULARY     [provider]  omeprazole (PRILOSEC) 40 MG capsule Take 40 mg by mouth daily.    [provider]  Oral Medication Containers (ALARM CLOCK PILL BOX) MISC 1 each by Does not apply route daily. 04/09/17   [name], NP  rifaximin (XIFAXAN) 550 MG TABS tablet Take 1 tablet (550 mg total) by mouth 2 (two) times daily. 07/09/16   Katharina Caper, MD  spironolactone (ALDACTONE) 50 MG tablet Take 1 tablet (50 mg total) by mouth 2 (two) times daily. 03/02/17   [name], NP  sucralfate (CARAFATE) 1 g tablet Take 1 tablet by mouth 4 (four) times daily.    [provider]  thiamine (VITAMIN B-1) 100 MG tablet Take 100 mg by mouth daily.    [provider]  traZODone (DESYREL) 100 MG tablet Take 1 tablet (100 mg total) by mouth at bedtime as needed for sleep. 03/24/17   [name], [name], MD    Allergies Tramadol  Family History  Problem Relation Age of Onset  . Heart disease Mother   . Hypertension Mother   . Hyperlipidemia Mother   .  Stroke Father   . Heart attack Father   . Hypertension Father   . Heart disease Father   . Alcohol abuse Father   . Heart disease Brother     Social History Social History  Substance Use Topics  . Smoking status: Never Smoker  . Smokeless tobacco: Never Used  . Alcohol use No    Review of Systems Constitutional:Positive fever ENT: Positive sore throat. Cardiovascular: Denies chest pain. Respiratory: Denies shortness of breath. Gastrointestinal: No abdominal pain.  No nausea, no vomiting.  No diarrhea.  No constipation. Musculoskeletal: Negative for back pain. Neurological: Negative for headaches   ____________________________________________   PHYSICAL EXAM:  VITAL SIGNS: ED Triage Vitals  Enc Vitals Group     BP 04/08/17 1844 114/68     Pulse Rate 04/08/17 1844 79     Resp 04/08/17 1844 18     Temp 04/08/17  1844 98.3 F (36.8 C)     Temp Source 04/08/17 1844 Oral     SpO2 04/08/17 1844 94 %     Weight 04/08/17 1845 (!) 348 lb (157.9 kg)     Height 04/08/17 1845 6\' 3"  (1.905 m)     Head Circumference --      Peak Flow --      Pain Score 04/08/17 1843 9     Pain Loc --      Pain Edu? --      Excl. in GC? --     Constitutional: Pleasant cooperative well-appearing Head: Atraumatic. Nose: No congestion/rhinnorhea. Mouth/Throat: No trismus uvula midline he does have mild pharyngeal erythema although no exudate on his tonsils he does have multiple small lesions on his soft palate He has bilateral cervical lymphadenopathy anteriorly Neck: No stridor.   Cardiovascular: Regular rate and rhythm Respiratory: Normal respiratory effort.  No retractions. Gastrointestinal: Soft nontender Neurologic:  Normal speech and language. No gross focal neurologic deficits are appreciated.  Skin:  Skin is warm, dry and intact. No rash noted.    ____________________________________________  LABS (all labs ordered are listed, but only abnormal results are displayed)  Labs Reviewed - No data to display   __________________________________________  EKG   ____________________________________________  RADIOLOGY   ____________________________________________   PROCEDURES  Procedure(s) performed: no  Procedures  Critical Care performed: no  Observation: no ____________________________________________   INITIAL IMPRESSION / ASSESSMENT AND PLAN / ED COURSE  Pertinent labs & imaging results that were available during my care of the patient were reviewed by me and considered in my medical decision making (see chart for details).  The patient is very well-appearing hemodynamically stable and protecting his airway. He has a clear pharyngitis with bilateral lymphadenopathy. Given reassurance and ibuprofen for his discomfort given his history of cirrhosis. He'll be discharged home with primary  care follow-up.      ____________________________________________   FINAL CLINICAL IMPRESSION(S) / ED DIAGNOSES  Final diagnoses:  Viral pharyngitis      NEW MEDICATIONS STARTED DURING THIS VISIT:  Discharge Medication List as of 04/08/2017  8:57 PM    START taking these medications   Details  ibuprofen (ADVIL,MOTRIN) 600 MG tablet Take 1 tablet (600 mg total) by mouth every 8 (eight) hours as needed., Starting Wed 04/08/2017, Print         Note:  This document was prepared using Dragon voice recognition software and may include unintentional dictation errors.      Merrily Brittle, MD 04/09/17 [id number]

## 2017-04-08 NOTE — ED Triage Notes (Signed)
Pt states that he is having bilateral facial swelling to his chin/jaw and underneath.  Pt states that it hurts to put any pressure on this area.  Pt states that he has no jaw pain/tooth pain.  Pt also reports not having any other injuries to this area.  Pt has no other complaints for today.  Pt is A&Ox4, in NAD, ambulatory to triage.

## 2017-04-09 ENCOUNTER — Encounter: Payer: Self-pay | Admitting: Nurse Practitioner

## 2017-04-09 ENCOUNTER — Telehealth: Payer: Self-pay | Admitting: Nurse Practitioner

## 2017-04-09 ENCOUNTER — Ambulatory Visit (INDEPENDENT_AMBULATORY_CARE_PROVIDER_SITE_OTHER): Payer: Medicare Other | Admitting: Nurse Practitioner

## 2017-04-09 VITALS — BP 109/57 | HR 69 | Temp 97.7°F | Ht 75.0 in | Wt 342.5 lb

## 2017-04-09 DIAGNOSIS — K7031 Alcoholic cirrhosis of liver with ascites: Secondary | ICD-10-CM

## 2017-04-09 DIAGNOSIS — Z9114 Patient's other noncompliance with medication regimen: Secondary | ICD-10-CM | POA: Diagnosis not present

## 2017-04-09 DIAGNOSIS — H60393 Other infective otitis externa, bilateral: Secondary | ICD-10-CM

## 2017-04-09 DIAGNOSIS — J9611 Chronic respiratory failure with hypoxia: Secondary | ICD-10-CM | POA: Diagnosis not present

## 2017-04-09 MED ORDER — CLOTRIMAZOLE 1 % EX SOLN: CUTANEOUS | 0 refills | Status: DC

## 2017-04-09 MED ORDER — ALARM CLOCK PILL BOX MISC: 1.0000 | Freq: Every day | 0 refills | Status: DC

## 2017-04-09 NOTE — Telephone Encounter (Signed)
Called back and changed to spray.  Also worked on solution for reminder to take lactulose.  Pharmacy will put sticker on pill packs coinciding with lactulose administration.

## 2017-04-09 NOTE — Progress Notes (Signed)
Subjective:    Patient ID: Howard [name], male    DOB: 1980/09/24, 36 y.o.   MRN: [id number]  Howard [name] is a 36 y.o. male presenting on 04/09/2017 for Shortness of Breath (pt was seen in the ER last night diagnose w/ Viral pharyngitis.swelling the face area x 3 days)   HPI Hospital f/u: Multiple hospital admissions and ED visits since last visit w/ me  02/03/17 - ED visit Auditory Hallucinations: Pt has followup w/ [name] regularly and notes today that he doesn't have auditory hallucinations daily anymore.  03/17/17 - ED visit Hyperammonemia: Pt has poor compliance w/ doses of lactulose.  Resolved hyperammonemia after extra lactulose doses in ED.  03/25/17 - ED w/ Hospital Admission for Upper GI Bleed: Pt w/ known esophageal varices who was having GI bleeding.  No upper endoscopy performed at that visit r/t resolution of symptoms.  Pt had begun drinking again despite regularly telling providers he was going to stop.  03/29/17- ED visit weakness and vomiting w/ blood clots: Pt w/o recurrence of hematemesis after non-witnessed occurrence.    04/03/17 - ED visit for chest pain: Determined symptoms were r/t GERD.  Negative cardiac workup.  Pt now reports no problems with chest pain or acid reflux causing heartburn.  He does remember to take his pill packs.  04/05/17 - ED w/ Hospital Admission for Hematemesis: Pt w/ hematemesis and recurrence of alcohol use.  Pt underwent upper endoscopy which did not reveal any active bleeding.  Grade 1 esophageal varices only. Discharged: 04/07/17 Follow up recommendations: none made  04/07/17 - Phone call from [name]' office regarding hypoxia of 85% SpO2 on 2 L [place]: Provided call to pts home to increase oxygen delivery to 3L [place].  Pt now on 2L today w/ resolved hypoxia.  Feels that he is breathing better. Tuesday called ambulance and they increased his O2 to 3L [place].   04/08/17 - Viral Pharyngitis: Pt presented w/ swelling of cervical lymph nodes and  neck/throat pain.  Pt improving today but has ongoing swelling of neck that is tender to touch.  Alcohol Abuse Pt states he has not had a drink in 5 days.  States he doesn't want to drink because he doesn't want to die.  He wants to drive his new truck.     GERD No acid reflux noted in last 2-3 weeks.  Chest pain was determined to be heartburn.  Pt states medication helps and he has not had any chest pain or heartburn since starting his esomeprazole.  Liver Failure Pt states he has not taken his lactulose in the last 2 days.  His last dose of lactulose was in the hospital.  He states he forgot to take his medicine.    Mental Health - Schizophrenia, depression Pt states his hallucinations are not daily any longer.  He is following w/ [name] for this condition and feels better on his current medications. Feels he has to force himself to eat and attributes this to depression.    Social History  Substance Use Topics  . Smoking status: Never Smoker  . Smokeless tobacco: Never Used  . Alcohol use No    Review of Systems Per HPI unless specifically indicated above     Objective:    BP (!) 109/57 (BP Location: Right Arm, Patient Position: Sitting, Cuff Size: Large)   Pulse 69   Temp 97.7 F (36.5 C) (Oral)   Ht 6\' 3"  (1.905 m)   Wt (!) 342  lb 8 oz (155.4 kg)   SpO2 95% Comment: 2 L of oxygen  BMI 42.81 kg/m   Wt Readings from Last 3 Encounters:  04/09/17 (!) 342 lb 8 oz (155.4 kg)  04/08/17 (!) 348 lb (157.9 kg)  04/05/17 (!) 348 lb (157.9 kg)    Physical Exam General - obese, well-appearing, NAD, wearing O2 at 2L [place] HEENT - Normocephalic, atraumatic, MMM, nares w/ dried blood from past epistaxis, oropharynx normal, bilat TM normal, External ear canal w/ white fungal growth present bilaterally,  Neck - supple, tender, deep cervical LAD Heart - RRR, no murmurs heard Lungs - Clear throughout all lobes, no wheezing, crackles, or rhonchi. Normal work of breathing. Abdomen -  soft, NT, distended, mild ascites w/o fluid wave present Extremeties - non-tender, trace pedal edema, cap refill < 2 seconds, peripheral pulses intact +2 bilaterally Skin - warm, dry, evidence of past self mutilation above elbows - no active lesions Neuro - awake, alert, oriented x3, normal gait Psych - Normal mood and withdrawn affect, normal behavior    Results for orders placed or performed during the hospital encounter of 04/05/17  Comprehensive metabolic panel  Result Value Ref Range   Sodium 139 135 - 145 mmol/L   Potassium 4.4 3.5 - 5.1 mmol/L   Chloride 102 101 - 111 mmol/L   CO2 30 22 - 32 mmol/L   Glucose, Bld 89 65 - 99 mg/dL   BUN 14 6 - 20 mg/dL   Creatinine, Ser 9.14 0.61 - 1.24 mg/dL   Calcium 9.2 8.9 - 78.2 mg/dL   Total Protein 8.4 (H) 6.5 - 8.1 g/dL   Albumin 3.7 3.5 - 5.0 g/dL   AST 88 (H) 15 - 41 U/L   ALT 54 17 - 63 U/L   Alkaline Phosphatase 110 38 - 126 U/L   Total Bilirubin 1.0 0.3 - 1.2 mg/dL   GFR calc non Af Amer >60 >60 mL/min   GFR calc Af Amer >60 >60 mL/min   Anion gap 7 5 - 15  CBC  Result Value Ref Range   WBC 6.5 3.8 - 10.6 K/uL   RBC 4.23 (L) 4.40 - 5.90 MIL/uL   Hemoglobin 12.2 (L) 13.0 - 18.0 g/dL   HCT 95.6 (L) 21.3 - 08.6 %   MCV 88.6 80.0 - 100.0 fL   MCH 28.8 26.0 - 34.0 pg   MCHC 32.5 32.0 - 36.0 g/dL   RDW 57.8 (H) 46.9 - 62.9 %   Platelets 146 (L) 150 - 440 K/uL  Protime-INR  Result Value Ref Range   Prothrombin Time 15.3 (H) 11.4 - 15.2 seconds   INR 1.20   Basic metabolic panel  Result Value Ref Range   Sodium 140 135 - 145 mmol/L   Potassium 4.2 3.5 - 5.1 mmol/L   Chloride 102 101 - 111 mmol/L   CO2 30 22 - 32 mmol/L   Glucose, Bld 90 65 - 99 mg/dL   BUN 16 6 - 20 mg/dL   Creatinine, Ser 5.28 0.61 - 1.24 mg/dL   Calcium 8.9 8.9 - 41.3 mg/dL   GFR calc non Af Amer >60 >60 mL/min   GFR calc Af Amer >60 >60 mL/min   Anion gap 8 5 - 15  CBC  Result Value Ref Range   WBC 5.7 3.8 - 10.6 K/uL   RBC 3.94 (L) 4.40 - 5.90  MIL/uL   Hemoglobin 11.2 (L) 13.0 - 18.0 g/dL   HCT 24.4 (L) 01.0 - 27.2 %  MCV 87.7 80.0 - 100.0 fL   MCH 28.4 26.0 - 34.0 pg   MCHC 32.3 32.0 - 36.0 g/dL   RDW 96.2 (H) 95.2 - 84.1 %   Platelets 110 (L) 150 - 440 K/uL  Glucose, capillary  Result Value Ref Range   Glucose-Capillary 86 65 - 99 mg/dL   Comment 1 Notify RN   Glucose, capillary  Result Value Ref Range   Glucose-Capillary 102 (H) 65 - 99 mg/dL  Hemoglobin  Result Value Ref Range   Hemoglobin 11.7 (L) 13.0 - 18.0 g/dL  Glucose, capillary  Result Value Ref Range   Glucose-Capillary 129 (H) 65 - 99 mg/dL  Glucose, capillary  Result Value Ref Range   Glucose-Capillary 167 (H) 65 - 99 mg/dL  Glucose, capillary  Result Value Ref Range   Glucose-Capillary 89 65 - 99 mg/dL   Comment 1 Notify RN   Glucose, capillary  Result Value Ref Range   Glucose-Capillary 96 65 - 99 mg/dL  Type and screen [facility]  Result Value Ref Range   ABO/RH(D) O POS    Antibody Screen NEG    Sample Expiration 04/08/2017       Assessment & Plan:   Problem List Items Addressed This Visit      Respiratory   Respiratory failure with hypoxia (HCC)    Pt stable today w/ 2L [place] and stable SpO2.  Plan: 1. Continue home O2 therapy.  Consider new referral to pulmonology if persistent difficulty w/ hypoxia. 2. Follow up 1 month.        Digestive   Alcoholic cirrhosis of liver with ascites (HCC)    Chronic, unstable problem. Pt w/o liver flap today.  Pt reports no lactulose doses since discharge from hospital on 7/17.    Plan: 1. [facility].  Will place stickers on pill packs coinciding to times when pt should also take lactulose.  4-6 doses daily recommended to patient. 2. Pt should continue to followup w/ GI. 3. [facility].       Other Visit Diagnoses    Nonadherence to medication    -  Primary Encouraged alarm clock for pt to remember to  take lactulose.    Plan: 1. Prefer alarm clock pill reminder.  Pt likely unable to afford this, so collaborated w/ pharmacy. (See A/P alcoholic cirrhosis) 2. Follow up 1 month   Relevant Medications   Oral Medication Containers (ALARM CLOCK PILL BOX) MISC   Other infective acute otitis externa of both ears     Pt complaint of neck pain and swollen lymph nodes.  ENT evaluation reveals external otitis due to fungal infection of both ears.  Plan: 1. Lotrimin external solution - place 3-4 drops in bilat ears bid x 10 days. 2. Follow up 1 month or as needed.   Relevant Medications   clotrimazole (LOTRIMIN) 1 % external solution      Meds ordered this encounter  Medications  . Oral Medication Containers (ALARM CLOCK PILL BOX) MISC    Sig: 1 each by Does not apply route daily.    Dispense:  1 each    Refill:  0    Pt needs medication alarm clock.  Does not have to be the pill box since pt has pill pack medications.  Please set to remind pt to take lactulose.    Order Specific Question:   Supervising Provider    Answer:  KARAMALEGOS, ALEXANDER J [[id number]]  . clotrimazole (LOTRIMIN) 1 % external solution    Sig: Apply 3-4 drops to each ear two times per day for 10 days.    Dispense:  30 mL    Refill:  0    Order Specific Question:   Supervising Provider    Answer:   Smitty CordsKARAMALEGOS, ALEXANDER J [[id number]]      Follow up plan: Return in about 4 weeks (around 05/07/2017) for liver failure.   Wilhelmina McardleLauren Darry Kelnhofer, DNP, AGPCNP-BC Adult Gerontology Primary Care Nurse Practitioner [facility] 04/12/2017, 10:27 PM

## 2017-04-09 NOTE — Telephone Encounter (Signed)
[facility] do not make the lotrimin solution any more.  They do cream,powder or spray.  Her call back number is [phone]

## 2017-04-09 NOTE — Patient Instructions (Addendum)
Marv, Thank you for coming in to clinic today.  1. Make sure you take at least 4 doses of your lactulose each day.  You hospital doctor would like you to take 6 doses per day.   2. We want to keep you out of the hospital and out of the emergency room. Please call the clinic when you have any medical problems.   [facility] phone number is [phone]   3. For your swollen neck: You have swollen lymph nodes This is what hurts.  These are likely from your ears. You have a fungal infection of your ear canal.   Please schedule a follow-up appointment with Wilhelmina McardleLauren Cheo Selvey, AGNP to Return in about 4 weeks (around 05/07/2017) for liver failure.  If you have any other questions or concerns, please feel free to call the clinic or send a message through MyChart. You may also schedule an earlier appointment if necessary.  Wilhelmina McardleLauren AmeLie Hollars, DNP, AGNP-BC Adult Gerontology Nurse Practitioner [facility], [facility]

## 2017-04-10 ENCOUNTER — Telehealth: Payer: Self-pay

## 2017-04-10 NOTE — Telephone Encounter (Signed)
called patient to see haow he was doing. pt states that he went to the doctor yesterday and his oxygen was doing ok but he still has to keep a check on it. .  he staters that his face was swollen and that they gave him ear drop to help with that.

## 2017-04-10 NOTE — Telephone Encounter (Signed)
If regular home health nursing, can have home health RN do hemoccult testing at pt's home.  Otherwise, can come to clinic for hemoccult test for bleeding.    Since, resolved there is no emergency.  Pt should call clinic with any additional bleeding.

## 2017-04-10 NOTE — Telephone Encounter (Signed)
Home Health nurse Britta MccreedyBarbara called requesting a verbal order for PT and Child psychotherapistocial Worker. She also stated while she was visiting Kevaughn he informed her he had (3) three bowel movements w/ bright red blood in it yesterday, but  today bowel movement was normal. Please advise

## 2017-04-10 NOTE — Telephone Encounter (Signed)
Howard [name] called wanting to inform us about his bloody bowel movements on yesterday. He informed me that his bowel movements today is back to normal. After reviewing his chart I notice he just had a EGD done 3 days ago. I informed him that he needs to contact Dr. Servando SnareWohl office and notify them of the bleed.

## 2017-04-10 NOTE — Telephone Encounter (Signed)
Called Howard MccreedyBarbara w/ [facility]: - Provided verbal order for PT and Social Work services. - Check Hemoccult stool sample at next home visit next week. - Discussed goals for keeping pt out of hospital/ED - Mutual goals. - Reviewed plan to have pharmacy add sticker to pillpacks corresponding to lactulose doses.  Needs at least 4 doses daily.  Appreciate the collaboration for care of this patient.

## 2017-04-12 NOTE — Assessment & Plan Note (Signed)
Chronic, unstable problem. Pt w/o liver flap today.  Pt reports no lactulose doses since discharge from hospital on 7/17.    Plan: 1. [facility].  Will place stickers on pill packs coinciding to times when pt should also take lactulose.  4-6 doses daily recommended to patient. 2. Pt should continue to followup w/ GI. 3. [facility]

## 2017-04-12 NOTE — Assessment & Plan Note (Signed)
Pt stable today w/ 2L [place] and stable SpO2.  Plan: 1. Continue home O2 therapy.  Consider new referral to pulmonology if persistent difficulty w/ hypoxia. 2. Follow up 1 month.

## 2017-04-13 NOTE — Progress Notes (Signed)
I have reviewed this encounter including the documentation in this note and/or discussed this patient with the provider, Wilhelmina McardleLauren [name], AGPCNP-BC. I am certifying that I agree with the content of this note as supervising physician.  Saralyn PilarAlexander Cassey Hurrell, DO [facility] 04/13/2017, 12:11 PM

## 2017-04-14 ENCOUNTER — Telehealth: Payer: Self-pay | Admitting: Nurse Practitioner

## 2017-04-14 ENCOUNTER — Encounter: Payer: Self-pay | Admitting: Emergency Medicine

## 2017-04-14 ENCOUNTER — Inpatient Hospital Stay
Admission: EM | Admit: 2017-04-14 | Discharge: 2017-04-16 | DRG: 432 | Disposition: A | Discharge status: Home / Self Care | Payer: Medicare Other | Attending: Internal Medicine | Admitting: Internal Medicine

## 2017-04-14 DIAGNOSIS — I8511 Secondary esophageal varices with bleeding: Secondary | ICD-10-CM | POA: Diagnosis present

## 2017-04-14 DIAGNOSIS — I4891 Unspecified atrial fibrillation: Secondary | ICD-10-CM | POA: Diagnosis present

## 2017-04-14 DIAGNOSIS — Z79899 Other long term (current) drug therapy: Secondary | ICD-10-CM | POA: Diagnosis not present

## 2017-04-14 DIAGNOSIS — K7290 Hepatic failure, unspecified without coma: Secondary | ICD-10-CM | POA: Diagnosis present

## 2017-04-14 DIAGNOSIS — K7030 Alcoholic cirrhosis of liver without ascites: Principal | ICD-10-CM | POA: Diagnosis present

## 2017-04-14 DIAGNOSIS — I10 Essential (primary) hypertension: Secondary | ICD-10-CM | POA: Diagnosis present

## 2017-04-14 DIAGNOSIS — Z7984 Long term (current) use of oral hypoglycemic drugs: Secondary | ICD-10-CM | POA: Diagnosis not present

## 2017-04-14 DIAGNOSIS — J449 Chronic obstructive pulmonary disease, unspecified: Secondary | ICD-10-CM | POA: Diagnosis present

## 2017-04-14 DIAGNOSIS — F329 Major depressive disorder, single episode, unspecified: Secondary | ICD-10-CM | POA: Diagnosis present

## 2017-04-14 DIAGNOSIS — F209 Schizophrenia, unspecified: Secondary | ICD-10-CM | POA: Diagnosis present

## 2017-04-14 DIAGNOSIS — E119 Type 2 diabetes mellitus without complications: Secondary | ICD-10-CM | POA: Diagnosis present

## 2017-04-14 DIAGNOSIS — E785 Hyperlipidemia, unspecified: Secondary | ICD-10-CM | POA: Diagnosis present

## 2017-04-14 DIAGNOSIS — K920 Hematemesis: Secondary | ICD-10-CM

## 2017-04-14 DIAGNOSIS — R109 Unspecified abdominal pain: Secondary | ICD-10-CM

## 2017-04-14 DIAGNOSIS — G40909 Epilepsy, unspecified, not intractable, without status epilepticus: Secondary | ICD-10-CM | POA: Diagnosis present

## 2017-04-14 LAB — COMPREHENSIVE METABOLIC PANEL
ALT: 52 U/L (ref 17–63)
ANION GAP: 8 (ref 5–15)
AST: 75 U/L — AB (ref 15–41)
Albumin: 3.8 g/dL (ref 3.5–5.0)
Alkaline Phosphatase: 98 U/L (ref 38–126)
BILIRUBIN TOTAL: 0.9 mg/dL (ref 0.3–1.2)
BUN: 11 mg/dL (ref 6–20)
CHLORIDE: 102 mmol/L (ref 101–111)
CO2: 27 mmol/L (ref 22–32)
Calcium: 9.3 mg/dL (ref 8.9–10.3)
Creatinine, Ser: 0.93 mg/dL (ref 0.61–1.24)
Glucose, Bld: 98 mg/dL (ref 65–99)
POTASSIUM: 4.3 mmol/L (ref 3.5–5.1)
Sodium: 137 mmol/L (ref 135–145)
TOTAL PROTEIN: 8.3 g/dL — AB (ref 6.5–8.1)

## 2017-04-14 LAB — URINALYSIS, COMPLETE (UACMP) WITH MICROSCOPIC
BACTERIA UA: NONE SEEN
BILIRUBIN URINE: NEGATIVE
GLUCOSE, UA: NEGATIVE mg/dL
Ketones, ur: NEGATIVE mg/dL
LEUKOCYTES UA: NEGATIVE
NITRITE: NEGATIVE
PROTEIN: NEGATIVE mg/dL
Specific Gravity, Urine: 1.018 (ref 1.005–1.030)
pH: 6 (ref 5.0–8.0)

## 2017-04-14 LAB — PROTIME-INR
INR: 1.17
Prothrombin Time: 15 seconds (ref 11.4–15.2)

## 2017-04-14 LAB — CBC
HEMATOCRIT: 35.1 % — AB (ref 40.0–52.0)
HEMOGLOBIN: 11.5 g/dL — AB (ref 13.0–18.0)
MCH: 28.8 pg (ref 26.0–34.0)
MCHC: 32.8 g/dL (ref 32.0–36.0)
MCV: 87.7 fL (ref 80.0–100.0)
Platelets: 134 10*3/uL — ABNORMAL LOW (ref 150–440)
RBC: 4 MIL/uL — AB (ref 4.40–5.90)
RDW: 19.1 % — AB (ref 11.5–14.5)
WBC: 5.8 10*3/uL (ref 3.8–10.6)

## 2017-04-14 LAB — AMMONIA: AMMONIA: 71 umol/L — AB (ref 9–35)

## 2017-04-14 LAB — HEMOGLOBIN: HEMOGLOBIN: 11.8 g/dL — AB (ref 13.0–18.0)

## 2017-04-14 LAB — GLUCOSE, CAPILLARY: Glucose-Capillary: 82 mg/dL (ref 65–99)

## 2017-04-14 LAB — APTT: aPTT: 31 seconds (ref 24–36)

## 2017-04-14 LAB — LIPASE, BLOOD: LIPASE: 31 U/L (ref 11–51)

## 2017-04-14 MED ORDER — LEVETIRACETAM 500 MG PO TABS
1500.0000 mg | ORAL_TABLET | Freq: Two times a day (BID) | ORAL | Status: DC
  Administered 2017-04-14 – 2017-04-16 (×4): 1500 mg via ORAL
  Filled 2017-04-14 (×5): qty 3

## 2017-04-14 MED ORDER — OCTREOTIDE LOAD VIA INFUSION
50.0000 ug | Freq: Once | INTRAVENOUS | Status: AC
  Administered 2017-04-14: 50 ug via INTRAVENOUS
  Filled 2017-04-14: qty 25

## 2017-04-14 MED ORDER — SPIRONOLACTONE 25 MG PO TABS
50.0000 mg | ORAL_TABLET | Freq: Two times a day (BID) | ORAL | Status: DC
  Administered 2017-04-14 – 2017-04-16 (×4): 50 mg via ORAL
  Filled 2017-04-14 (×4): qty 2

## 2017-04-14 MED ORDER — INSULIN ASPART 100 UNIT/ML ~~LOC~~ SOLN
0.0000 [IU] | Freq: Three times a day (TID) | SUBCUTANEOUS | Status: DC
  Administered 2017-04-15: 1 [IU] via SUBCUTANEOUS
  Filled 2017-04-14: qty 1

## 2017-04-14 MED ORDER — DEXTROSE 5 % IV SOLN
1.0000 g | INTRAVENOUS | Status: DC
  Administered 2017-04-15 (×2): 1 g via INTRAVENOUS
  Filled 2017-04-14 (×3): qty 10

## 2017-04-14 MED ORDER — ALBUTEROL SULFATE (2.5 MG/3ML) 0.083% IN NEBU: 3.0000 mL | INHALATION_SOLUTION | Freq: Four times a day (QID) | RESPIRATORY_TRACT | Status: DC | PRN

## 2017-04-14 MED ORDER — TRAZODONE HCL 50 MG PO TABS: 100.0000 mg | ORAL_TABLET | Freq: Every evening | ORAL | Status: DC | PRN

## 2017-04-14 MED ORDER — PANTOPRAZOLE SODIUM 40 MG IV SOLR
8.0000 mg/h | INTRAVENOUS | Status: DC
  Administered 2017-04-14 – 2017-04-16 (×4): 8 mg/h via INTRAVENOUS
  Filled 2017-04-14 (×4): qty 80

## 2017-04-14 MED ORDER — PANTOPRAZOLE SODIUM 40 MG IV SOLR: 40.0000 mg | Freq: Two times a day (BID) | INTRAVENOUS | Status: DC

## 2017-04-14 MED ORDER — NADOLOL 20 MG PO TABS
20.0000 mg | ORAL_TABLET | Freq: Every day | ORAL | Status: DC
  Administered 2017-04-15 – 2017-04-16 (×2): 20 mg via ORAL
  Filled 2017-04-14 (×2): qty 1

## 2017-04-14 MED ORDER — ACAMPROSATE CALCIUM 333 MG PO TBEC
666.0000 mg | DELAYED_RELEASE_TABLET | Freq: Three times a day (TID) | ORAL | Status: DC
  Administered 2017-04-15 – 2017-04-16 (×5): 666 mg via ORAL
  Filled 2017-04-14 (×6): qty 2

## 2017-04-14 MED ORDER — ACETAMINOPHEN 325 MG PO TABS: 650.0000 mg | ORAL_TABLET | Freq: Four times a day (QID) | ORAL | Status: DC | PRN

## 2017-04-14 MED ORDER — INSULIN ASPART 100 UNIT/ML ~~LOC~~ SOLN: 0.0000 [IU] | Freq: Every day | SUBCUTANEOUS | Status: DC

## 2017-04-14 MED ORDER — FUROSEMIDE 40 MG PO TABS
20.0000 mg | ORAL_TABLET | Freq: Every day | ORAL | Status: DC
  Administered 2017-04-15 – 2017-04-16 (×2): 20 mg via ORAL
  Filled 2017-04-14 (×2): qty 1

## 2017-04-14 MED ORDER — SUCRALFATE 1 G PO TABS
1.0000 g | ORAL_TABLET | Freq: Four times a day (QID) | ORAL | Status: DC
  Administered 2017-04-14 – 2017-04-16 (×6): 1 g via ORAL
  Filled 2017-04-14 (×6): qty 1

## 2017-04-14 MED ORDER — LOSARTAN POTASSIUM 50 MG PO TABS
25.0000 mg | ORAL_TABLET | Freq: Every day | ORAL | Status: DC
  Administered 2017-04-15 – 2017-04-16 (×2): 25 mg via ORAL
  Filled 2017-04-14 (×2): qty 1

## 2017-04-14 MED ORDER — LACTULOSE 10 GM/15ML PO SOLN
30.0000 g | Freq: Four times a day (QID) | ORAL | Status: DC
  Administered 2017-04-14 – 2017-04-16 (×5): 30 g via ORAL
  Filled 2017-04-14 (×5): qty 60

## 2017-04-14 MED ORDER — SODIUM CHLORIDE 0.9 % IV SOLN
80.0000 mg | Freq: Once | INTRAVENOUS | Status: AC
  Administered 2017-04-14: 19:00:00 80 mg via INTRAVENOUS
  Filled 2017-04-14: qty 80

## 2017-04-14 MED ORDER — RIFAXIMIN 550 MG PO TABS
550.0000 mg | ORAL_TABLET | Freq: Two times a day (BID) | ORAL | Status: DC
  Administered 2017-04-14 – 2017-04-16 (×4): 550 mg via ORAL
  Filled 2017-04-14 (×4): qty 1

## 2017-04-14 MED ORDER — LURASIDONE HCL 80 MG PO TABS
80.0000 mg | ORAL_TABLET | Freq: Every day | ORAL | Status: DC
  Administered 2017-04-15: 16:00:00 80 mg via ORAL
  Filled 2017-04-14 (×2): qty 1

## 2017-04-14 MED ORDER — SODIUM CHLORIDE 0.9 % IV SOLN
50.0000 ug/h | INTRAVENOUS | Status: DC
  Administered 2017-04-14 – 2017-04-16 (×3): 50 ug/h via INTRAVENOUS
  Filled 2017-04-14 (×6): qty 1

## 2017-04-14 MED ORDER — FOLIC ACID 1 MG PO TABS
1.0000 mg | ORAL_TABLET | Freq: Every day | ORAL | Status: DC
  Administered 2017-04-15 – 2017-04-16 (×2): 1 mg via ORAL
  Filled 2017-04-14 (×2): qty 1

## 2017-04-14 MED ORDER — VITAMIN B-1 100 MG PO TABS
100.0000 mg | ORAL_TABLET | Freq: Every day | ORAL | Status: DC
  Administered 2017-04-15 – 2017-04-16 (×2): 100 mg via ORAL
  Filled 2017-04-14 (×2): qty 1

## 2017-04-14 MED ORDER — ACETAMINOPHEN 650 MG RE SUPP: 650.0000 mg | Freq: Four times a day (QID) | RECTAL | Status: DC | PRN

## 2017-04-14 NOTE — Telephone Encounter (Signed)
Britta MccreedyBarbara with [facility] said they do not have any hemocult cards and asked it the office has any for stool sample.  Please call [phone]

## 2017-04-14 NOTE — ED Notes (Signed)
Called pharmacy for medications ordered.

## 2017-04-14 NOTE — ED Notes (Signed)
Pt states he threw up bright red blood with clots once today. States "my liver hurts." pt states he has a hx of cirrhosis. Points to RUQ when asked where pain is. Pt wears 2 L nasal cannula all the time. Pt has nurse that comes to his house and told him to come up here today.

## 2017-04-14 NOTE — Progress Notes (Signed)
Pharmacy Antibiotic Note  Howard [name] is a 36 y.o. male admitted on 04/14/2017 with variceal bleed and possible SBP.  Pharmacy has been consulted for ceftriaxone dosing.  Plan: Ceftriaxone 1 g IV daily  Weight: (!) 342 lb (155.1 kg)  Temp (24hrs), Avg:98.6 F (37 C), Min:98.6 F (37 C), Max:98.6 F (37 C)   Recent Labs Lab 04/14/17 1435  WBC 5.8  CREATININE 0.93    Estimated Creatinine Clearance: 171.7 mL/min (by C-G formula based on SCr of 0.93 mg/dL).    Allergies  Allergen Reactions  . Tramadol Itching    Antimicrobials this admission: ceftriaxone 7/24 >>   Dose adjustments this admission:  Microbiology results: None  Thank you for allowing pharmacy to be a part of this patient's care.  Cindi CarbonMary M Bronte Sabado, PharmD Clinical Pharmacist 04/14/2017 7:43 PM

## 2017-04-14 NOTE — H&P (Signed)
[facility] [place] [facility]   PATIENT NAME: Howard [name]    MR#:  [id number]  DATE OF BIRTH:  Jul 26, 1981  DATE OF ADMISSION:  04/14/2017  PRIMARY CARE PHYSICIAN: [name], NP   REQUESTING/REFERRING PHYSICIAN: Dr Cecil Cobbs  CHIEF COMPLAINT:   Chief Complaint  Patient presents with  . Hematemesis    HISTORY OF PRESENT ILLNESS:  Howard [name]  is a 36 y.o. male with a known history of Alcoholic cirrhosis presents with hematemesis. He states he vomited up bright red blood today 1 time. He states last week he also had bright red blood per thumb. He also complains of 10 out of 10 abdominal pain in his right abdomen lasting 3-4 seconds and a dull ache 4 out of 10 intensity after that. He states it is a doubling over pain stabbing type pain when it 10 out of 10 in intensity and he can't take it anymore. Hospitalist services were contacted for further evaluation. Of note the patient had an upper endoscopy 04/07/2017 which showed grade 1 esophageal varices. Patient states that he quit alcohol 9 days ago.  PAST MEDICAL HISTORY:   Past Medical History:  Diagnosis Date  . Alcoholic cirrhosis of liver with ascites (HCC)   . Alcoholism (HCC)   . Anemia   . Atrial fibrillation (HCC)   . COPD (chronic obstructive pulmonary disease) (HCC)   . Depression   . Diabetes mellitus, type II (HCC)   . Esophageal varices (HCC)   . Heart disease    irregular heart beat (palpitations) and heart murmur  . Hyperlipemia   . Hypertension   . Liver disease   . Multiple thyroid nodules   . Portal hypertensive gastropathy (HCC)   . Schizophrenia (HCC)   . Seizures (HCC)     PAST SURGICAL HISTORY:   Past Surgical History:  Procedure Laterality Date  . ESOPHAGOGASTRODUODENOSCOPY N/A 10/05/2015   Procedure: ESOPHAGOGASTRODUODENOSCOPY (EGD);  Surgeon: Elnita Maxwell, MD;  Location[facility] ENDOSCOPY;  Service: Endoscopy;  Laterality: N/A;  .  ESOPHAGOGASTRODUODENOSCOPY (EGD) WITH PROPOFOL N/A 08/03/2015   Procedure: ESOPHAGOGASTRODUODENOSCOPY (EGD) WITH PROPOFOL;  Surgeon: Elnita Maxwell, MD;  Location: [facility] ENDOSCOPY;  Service: Endoscopy;  Laterality: N/A;  . ESOPHAGOGASTRODUODENOSCOPY (EGD) WITH PROPOFOL N/A 08/31/2015   Procedure: ESOPHAGOGASTRODUODENOSCOPY (EGD) WITH PROPOFOL;  Surgeon: Elnita Maxwell, MD;  Location: [facility] ENDOSCOPY;  Service: Endoscopy;  Laterality: N/A;  . ESOPHAGOGASTRODUODENOSCOPY (EGD) WITH PROPOFOL N/A 04/04/2016   Procedure: ESOPHAGOGASTRODUODENOSCOPY (EGD) WITH PROPOFOL;  Surgeon: Scot Jun, MD;  Location: [facility] ENDOSCOPY;  Service: Endoscopy;  Laterality: N/A;  . ESOPHAGOGASTRODUODENOSCOPY (EGD) WITH PROPOFOL N/A 11/13/2016   Procedure: ESOPHAGOGASTRODUODENOSCOPY (EGD) WITH PROPOFOL;  Surgeon: Wyline Mood, MD;  Location: [facility] ENDOSCOPY;  Service: Endoscopy;  Laterality: N/A;  . ESOPHAGOGASTRODUODENOSCOPY (EGD) WITH PROPOFOL N/A 04/07/2017   Procedure: ESOPHAGOGASTRODUODENOSCOPY (EGD) WITH PROPOFOL;  Surgeon: Midge Minium, MD;  Location: [facility] ENDOSCOPY;  Service: Endoscopy;  Laterality: N/A;  . NO PAST SURGERIES      SOCIAL HISTORY:   Social History  Substance Use Topics  . Smoking status: Never Smoker  . Smokeless tobacco: Never Used  . Alcohol use No    FAMILY HISTORY:   Family History  Problem Relation Age of Onset  . Heart disease Mother   . Hypertension Mother   . Hyperlipidemia Mother   . Stroke Father   . Heart attack Father   . Hypertension Father   . Heart disease Father   . Alcohol abuse Father   .  Heart disease Brother     DRUG ALLERGIES:   Allergies  Allergen Reactions  . Tramadol Itching    REVIEW OF SYSTEMS:  CONSTITUTIONAL: No fever. Positive for fatigue. Positive for weight loss EYES: No blurred or double vision.  EARS, NOSE, AND THROAT: No tinnitus or ear pain. No sore throat. Positive for runny nose RESPIRATORY: No cough, positive for shortness of  breath, no wheezing or hemoptysis.  CARDIOVASCULAR: Positive for chest pain. No orthopnea, edema.  GASTROINTESTINAL: Positive for hematemesis nausea, vomiting, diarrhea and abdominal pain. Positive for bright red blood per rectum last week GENITOURINARY: Positive for dysuria, no hematuria.  ENDOCRINE: No polyuria, nocturia,  HEMATOLOGY: No anemia, easy bruising or bleeding SKIN: Psoriasis MUSCULOSKELETAL: Positive for knee pain   NEUROLOGIC: No tingling, numbness, weakness.  PSYCHIATRY: No anxiety or depression.   MEDICATIONS AT HOME:   Prior to Admission medications   Medication Sig Start Date End Date Taking? Authorizing Provider  acamprosate (CAMPRAL) 333 MG tablet Take 2 tablets (666 mg total) by mouth 3 (three) times daily with meals. 03/24/17   [name], Jackquline DenmarkJohn T, MD  albuterol (PROVENTIL HFA;VENTOLIN HFA) 108 (90 Base) MCG/ACT inhaler Inhale 2 puffs into the lungs every 6 (six) hours as needed for wheezing or shortness of breath. Patient taking differently: Inhale 2 puffs into the lungs every 4 (four) hours as needed for wheezing or shortness of breath.  10/08/16   Shaune Pollackhen, Qing, MD  clotrimazole (LOTRIMIN) 1 % external solution Apply 3-4 drops to each ear two times per day for 10 days. 04/09/17   Galen ManilaKennedy, [name], NP  docusate sodium (COLACE) 100 MG capsule Take 100 mg by mouth daily as needed for mild constipation.    [provider]  esomeprazole (NEXIUM) 40 MG capsule Take 1 capsule (40 mg total) by mouth daily. 04/03/17 04/03/18  Charlynne PanderYao, David Hsienta, MD  ferrous sulfate 325 (65 FE) MG tablet Take 3 tablets (975 mg total) by mouth every Monday, Wednesday, and Friday. With breakfast 01/21/17   Galen ManilaKennedy, [name], NP  folic acid (FOLVITE) 1 MG tablet Take 1 tablet (1 mg total) by mouth daily. 12/19/16   Gouru, Deanna ArtisAruna, MD  furosemide (LASIX) 20 MG tablet Take 20 mg by mouth daily.    [provider]  ibuprofen (ADVIL,MOTRIN) 600 MG tablet Take 1 tablet (600 mg total) by mouth  every 8 (eight) hours as needed. 04/08/17   Merrily Brittleifenbark, Neil, MD  lactulose (CHRONULAC) 10 GM/15ML solution Take 45 mLs (30 g total) by mouth every 4 (four) hours. Patient taking differently: Take 30 g by mouth 4 (four) times daily.  07/09/16   Katharina CaperVaickute, Rima, MD  levETIRAcetam (KEPPRA) 750 MG tablet Take 2 tablets (1,500 mg total) by mouth 2 (two) times daily. 12/11/16 04/05/18  Merrily Brittleifenbark, Neil, MD  losartan (COZAAR) 25 MG tablet Take 1 tablet (25 mg total) by mouth daily. 03/11/17   Galen ManilaKennedy, [name], NP  lurasidone (LATUDA) 80 MG TABS tablet Take 1 tablet (80 mg total) by mouth daily with supper. 03/24/17   [name], Jackquline DenmarkJohn T, MD  metFORMIN (GLUCOPHAGE) 500 MG tablet TAKE 1 TABLET BY MOUTH 2 TIMES DAILY WITH MEALS 03/26/17   Galen ManilaKennedy, [name], NP  nadolol (CORGARD) 20 MG tablet Take 1 tablet (20 mg total) by mouth daily. 03/11/17   Galen ManilaKennedy, [name], NP  NON FORMULARY     [provider]  omeprazole (PRILOSEC) 40 MG capsule Take 40 mg by mouth daily.    [provider]  Oral Medication Containers (  ALARM CLOCK PILL BOX) MISC 1 each by Does not apply route daily. 04/09/17   [name], NP  rifaximin (XIFAXAN) 550 MG TABS tablet Take 1 tablet (550 mg total) by mouth 2 (two) times daily. 07/09/16   Katharina Caper, MD  spironolactone (ALDACTONE) 50 MG tablet Take 1 tablet (50 mg total) by mouth 2 (two) times daily. 03/02/17   [name], NP  sucralfate (CARAFATE) 1 g tablet Take 1 tablet by mouth 4 (four) times daily.    [provider]  thiamine (VITAMIN B-1) 100 MG tablet Take 100 mg by mouth daily.    [provider]  traZODone (DESYREL) 100 MG tablet Take 1 tablet (100 mg total) by mouth at bedtime as needed for sleep. 03/24/17   [name], [name], MD      VITAL SIGNS:  Blood pressure (!) 151/91, pulse (!) 59, temperature 98.6 F (37 C), temperature source Oral, resp. rate 18, weight (!) 155.1 kg (342 lb), SpO2 95 %.  PHYSICAL EXAMINATION:   GENERAL:  36 y.o.-year-old patient lying in the bed with no acute distress.  EYES: Pupils equal, round, reactive to light and accommodation. No scleral icterus. Extraocular muscles intact.  HEENT: Head atraumatic, normocephalic. Oropharynx and nasopharynx clear.  NECK:  Supple, no jugular venous distention. No thyroid enlargement, no tenderness.  LUNGS: Normal breath sounds bilaterally, no wheezing, rales,rhonchi or crepitation. No use of accessory muscles of respiration.  CARDIOVASCULAR: S1, S2 normal. No murmurs, rubs, or gallops.  ABDOMEN: Soft, Right lower quadrant tenderness, distended. Bowel sounds present. No organomegaly or mass.  EXTREMITIES: Trace edema, no cyanosis, or clubbing.  NEUROLOGIC: Cranial nerves II through XII are intact. Muscle strength 5/5 in all extremities. Sensation intact. Gait not checked.  PSYCHIATRIC: The patient is alert and oriented x 3.  SKIN: No rash, lesion, or ulcer.   LABORATORY PANEL:   CBC  Recent Labs Lab 04/14/17 1435  WBC 5.8  HGB 11.5*  HCT 35.1*  PLT 134*   ------------------------------------------------------------------------------------------------------------------  Chemistries   Recent Labs Lab 04/14/17 1435  NA 137  K 4.3  CL 102  CO2 27  GLUCOSE 98  BUN 11  CREATININE 0.93  CALCIUM 9.3  AST 75*  ALT 52  ALKPHOS 98  BILITOT 0.9   ------------------------------------------------------------------------------------------------------------------    IMPRESSION AND PLAN:   1. Hematemesis with bright red blood. History of esophageal varices. Patient placed on octreotide and Protonix drips. Nothing by mouth. ER physician spoke with Dr. Tobi Bastos gastroenterology to see in the morning. Serial hemoglobins. Empiric Rocephin. 2. Abdominal pain. We'll get an ultrasound of the abdomen to see if there is ascites for paracentesis. Empiric Rocephin just in case SBP. 3. Alcoholic cirrhosis. Patient states that he stopped drinking 9  days ago. Continue lactulose and Xifaxan 4 history of hepatic encephalopathy 4. Essential hypertension continue usual medications 5. Seizure disorder on Keppra 6. Type 2 diabetes mellitus. Hold Glucophage and put on sliding scale  All the records are reviewed and case discussed with ED provider. Management plans discussed with the patient, family and they are in agreement.  CODE STATUS: Full code  TOTAL TIME TAKING CARE OF THIS PATIENT: 50 minutes.    Alford Highland M.D on 04/14/2017 at 7:48 PM  Between 7am to 6pm - Pager - [phone]  After 6pm call admission pager [phone]  [facility] Office  [phone]  CC: Primary care physician; [name], NP

## 2017-04-14 NOTE — ED Notes (Signed)
Attempted to call report, was informed RN receiving report is passing medications. Gave name and number and awaiting return call.

## 2017-04-14 NOTE — ED Provider Notes (Signed)
[facility] Emergency Department Provider Note  ____________________________________________  Time seen: Approximately 6:11 PM   I have reviewed the triage vital signs and the nursing notes.   HISTORY  Chief Complaint Hematemesis   HPI Howard [name] is a 36 y.o. 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 Patient reports one episode of hematemesis earlier today with several large blood clots. This was around 1 PM. No melena. Patient also complaining of intermittent right upper quadrant abdominal pain that is sharp, severe, lasting a few seconds at a time and resolving. No pain at this time. Patient reports that he hasn't drank alcohol for 9 days. No fever, chills, chest pain, shortness of breath, cough, diarrhea, constipation, dysuria, hematuria.  Past Medical History:  Diagnosis Date  . Alcoholic cirrhosis of liver with ascites (HCC)   . Alcoholism (HCC)   . Anemia   . Atrial fibrillation (HCC)   . COPD (chronic obstructive pulmonary disease) (HCC)   . Depression   . Diabetes mellitus, type II (HCC)   . Esophageal varices (HCC)   . Heart disease    irregular heart beat (palpitations) and heart murmur  . Hyperlipemia   . Hypertension   . Liver disease   . Multiple thyroid nodules   . Portal hypertensive gastropathy (HCC)   . Schizophrenia (HCC)   . Seizures [facility](HCC)     Patient Active Problem List   Diagnosis Date Noted  . Esophageal varices without bleeding (HCC)   . Hematemesis 04/05/2017  . Upper GI bleed   . Iron deficiency anemia due to chronic blood loss 02/16/2017  . Nausea 01/18/2017  . Respiratory failure with hypoxia (HCC) 12/14/2016  . Seizures (HCC) 12/12/2016  . DNR (do not resuscitate) discussion 08/11/2016  . Muscle weakness (generalized)   . GI bleed 08/07/2016    . OSA (obstructive sleep apnea) 04/29/2016  . Anemia 04/29/2016  . Thrombocytopenia (HCC) 04/29/2016  . Coagulopathy (HCC) 04/29/2016  . Obesity 04/29/2016  . Controlled type 2 diabetes mellitus without complication (HCC) 01/15/2016  . Essential (primary) hypertension 12/11/2015  . Pure hypercholesterolemia 12/11/2015  . Hepatic encephalopathy (HCC) 10/16/2015  . 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 Elevated transaminase level 07/04/2015  . Alcohol abuse 05/31/2015  . Paranoid schizophrenia (HCC) 03/20/2015    Past Surgical History:  Procedure Laterality Date  . ESOPHAGOGASTRODUODENOSCOPY N/A 10/05/2015   Procedure: ESOPHAGOGASTRODUODENOSCOPY (EGD);  Surgeon: Elnita MaxwellMatthew Gordon Rein, MD;  [facility];  Service: Endoscopy;  Laterality: N/A;  . ESOPHAGOGASTRODUODENOSCOPY (EGD) WITH PROPOFOL N/A 08/03/2015   Procedure: ESOPHAGOGASTRODUODENOSCOPY (EGD) WITH PROPOFOL;  Surgeon: Elnita MaxwellMatthew Gordon Rein, MD;  [facility];  Service: Endoscopy;  Laterality: N/A;  . ESOPHAGOGASTRODUODENOSCOPY (EGD) WITH PROPOFOL N/A 08/31/2015   Procedure: ESOPHAGOGASTRODUODENOSCOPY (EGD) WITH PROPOFOL;  Surgeon: Elnita MaxwellMatthew Gordon Rein, MD;  [facility];  Service: Endoscopy;  Laterality: N/A;  . ESOPHAGOGASTRODUODENOSCOPY (EGD) WITH PROPOFOL N/A 04/04/2016   Procedure: ESOPHAGOGASTRODUODENOSCOPY (EGD) WITH PROPOFOL;  Surgeon: Scot Junobert T Elliott, MD;  [facility];  Service: Endoscopy;  Laterality: N/A;  . ESOPHAGOGASTRODUODENOSCOPY (EGD) WITH PROPOFOL N/A 11/13/2016   Procedure: ESOPHAGOGASTRODUODENOSCOPY (EGD) WITH PROPOFOL;  Surgeon: Wyline MoodKiran Anna, MD;  Location: [facility] ENDOSCOPY;  Service: Endoscopy;  Laterality: N/A;  . ESOPHAGOGASTRODUODENOSCOPY (EGD) WITH PROPOFOL N/A 04/07/2017   Procedure: ESOPHAGOGASTRODUODENOSCOPY (EGD) WITH PROPOFOL;  Surgeon: Midge MiniumWohl, Darren, MD;  Location: [facility] ENDOSCOPY;  Service: Endoscopy;  Laterality:  N/A;  . NO PAST SURGERIES       Prior to Admission medications   Medication Sig Start Date End Date Taking? Authorizing Provider  acamprosate (CAMPRAL) 333 MG tablet Take 2 tablets (666 mg total) by mouth 3 (three) times daily with meals. 03/24/17   [name], [name], MD  albuterol (PROVENTIL HFA;VENTOLIN HFA) 108 (90 Base) MCG/ACT inhaler Inhale 2 puffs into the lungs every 6 (six) hours as needed for wheezing or shortness of breath. Patient taking differently: Inhale 2 puffs into the lungs every 4 (four) hours as needed for wheezing or shortness of breath.  10/08/16   Shaune Pollack, MD  clotrimazole (LOTRIMIN) 1 % external solution Apply 3-4 drops to each ear two times per day for 10 days. 04/09/17   [name], NP  docusate sodium (COLACE) 100 MG capsule Take 100 mg by mouth daily as needed for mild constipation.    [provider]  esomeprazole (NEXIUM) 40 MG capsule Take 1 capsule (40 mg total) by mouth daily. 04/03/17 04/03/18  Charlynne Pander, MD  ferrous sulfate 325 (65 FE) MG tablet Take 3 tablets (975 mg total) by mouth every Monday, Wednesday, and Friday. With breakfast 01/21/17   [name], NP  folic acid (FOLVITE) 1 MG tablet Take 1 tablet (1 mg total) by mouth daily. 12/19/16   Gouru, Deanna Artis, MD  furosemide (LASIX) 20 MG tablet Take 20 mg by mouth daily.    [provider]  ibuprofen (ADVIL,MOTRIN) 600 MG tablet Take 1 tablet (600 mg total) by mouth every 8 (eight) hours as needed. 04/08/17   Merrily Brittle, MD  lactulose (CHRONULAC) 10 GM/15ML solution Take 45 mLs (30 g total) by mouth every 4 (four) hours. Patient taking differently: Take 30 g by mouth 4 (four) times daily.  07/09/16   Katharina Caper, MD  levETIRAcetam (KEPPRA) 750 MG tablet Take 2 tablets (1,500 mg total) by mouth 2 (two) times daily. 12/11/16 04/05/18  Merrily Brittle, MD  losartan (COZAAR) 25 MG tablet Take 1 tablet (25 mg total) by mouth daily. 03/11/17   [name], NP  lurasidone (LATUDA) 80 MG TABS  tablet Take 1 tablet (80 mg total) by mouth daily with supper. 03/24/17   [name], [name], MD  metFORMIN (GLUCOPHAGE) 500 MG tablet TAKE 1 TABLET BY MOUTH 2 TIMES DAILY WITH MEALS 03/26/17   [name], NP  nadolol (CORGARD) 20 MG tablet Take 1 tablet (20 mg total) by mouth daily. 03/11/17   [name], NP  NON FORMULARY     [provider]  omeprazole (PRILOSEC) 40 MG capsule Take 40 mg by mouth daily.    [provider]  Oral Medication Containers (ALARM CLOCK PILL BOX) MISC 1 each by Does not apply route daily. 04/09/17   [name], NP  rifaximin (XIFAXAN) 550 MG TABS tablet Take 1 tablet (550 mg total) by mouth 2 (two) times daily. 07/09/16   Katharina Caper, MD  spironolactone (ALDACTONE) 50 MG tablet Take 1 tablet (50 mg total) by mouth 2 (two) times daily. 03/02/17   [name], NP  sucralfate (CARAFATE) 1 g tablet Take 1 tablet by mouth 4 (four) times daily.    [provider]  thiamine (VITAMIN B-1) 100 MG tablet Take 100 mg by mouth daily.    [provider]  traZODone (DESYREL) 100 MG tablet Take 1 tablet (100 mg total) by mouth at bedtime as needed for sleep. 03/24/17   [name], Jonny Ruiz  T, MD    Allergies Tramadol  Family History  Problem Relation Age of Onset  . Heart disease Mother   . Hypertension Mother   . Hyperlipidemia Mother   . Stroke Father   . Heart attack Father   . Hypertension Father   . Heart disease Father   . Alcohol abuse Father   . Heart disease Brother     Social History Social History  Substance Use Topics  . Smoking status: Never Smoker  . Smokeless tobacco: Never Used  . Alcohol use No    Review of Systems  Constitutional: Negative for fever. Eyes: Negative for visual changes. ENT: Negative for sore throat. Neck: No neck pain  Cardiovascular: Negative for chest pain. Respiratory: Negative for shortness of breath. Gastrointestinal: + RUQ abdominal pain and  hematemesis Genitourinary: Negative for dysuria. Musculoskeletal: Negative for back pain. Skin: Negative for rash. Neurological: Negative for headaches, weakness or numbness. Psych: No SI or HI  ____________________________________________   PHYSICAL EXAM:  VITAL SIGNS: ED Triage Vitals  Enc Vitals Group     BP 04/14/17 1419 138/73     Pulse Rate 04/14/17 1419 72     Resp 04/14/17 1419 18     Temp 04/14/17 1419 98.6 F (37 C)     Temp Source 04/14/17 1419 Oral     SpO2 04/14/17 1419 97 %     Weight 04/14/17 1420 (!) 342 lb (155.1 kg)     Height --      Head Circumference --      Peak Flow --      Pain Score 04/14/17 1419 10     Pain Loc --      Pain Edu? --      Excl. in GC? --     Constitutional: Alert and oriented. Well appearing and in no apparent distress. HEENT:      Head: Normocephalic and atraumatic.         Eyes: Conjunctivae are normal. Sclera is non-icteric.       Mouth/Throat: Mucous membranes are moist.       Neck: Supple with no signs of meningismus. Cardiovascular: Regular rate and rhythm. No murmurs, gallops, or rubs. 2+ symmetrical distal pulses are present in all extremities. No JVD. Respiratory: Normal respiratory effort. Lungs are clear to auscultation bilaterally. No wheezes, crackles, or rhonchi.  Gastrointestinal: Soft, non tender, and non distended with positive bowel sounds. No rebound or guarding. Musculoskeletal: Nontender with normal range of motion in all extremities. No edema, cyanosis, or erythema of extremities. Neurologic: Normal speech and language. Face is symmetric. Moving all extremities. No gross focal neurologic deficits are appreciated. Skin: Skin is warm, dry and intact. No rash noted. Psychiatric: Mood and affect are normal. Speech and behavior are normal.  ____________________________________________   LABS (all labs ordered are listed, but only abnormal results are displayed)  Labs Reviewed  COMPREHENSIVE METABOLIC PANEL -  Abnormal; Notable for the following:       Result Value   Total Protein 8.3 (*)    AST 75 (*)    All other components within normal limits  CBC - Abnormal; Notable for the following:    RBC 4.00 (*)    Hemoglobin 11.5 (*)    HCT 35.1 (*)    RDW 19.1 (*)    Platelets 134 (*)    All other components within normal limits  URINALYSIS, COMPLETE (UACMP) WITH MICROSCOPIC - Abnormal; Notable for the following:    Color, Urine YELLOW (*)  APPearance CLEAR (*)    Hgb urine dipstick LARGE (*)    Squamous Epithelial / LPF 0-5 (*)    All other components within normal limits  LIPASE, BLOOD  PROTIME-INR  APTT   ____________________________________________  EKG  ED ECG REPORT I, Nita Sickle, the attending physician, personally viewed and interpreted this ECG.  Sinus bradycardia, rate of 60, normal intervals, normal axis, no ST elevations or depressions. ____________________________________________  RADIOLOGY  none  ____________________________________________   PROCEDURES  Procedure(s) performed: None Procedures Critical Care performed:  None ____________________________________________   INITIAL IMPRESSION / ASSESSMENT AND PLAN / ED COURSE  36 y.o. 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 Patient is hemodynamically stable with stable hemoglobin at this time however due to history of varices patient will be started on protonix, octreotide, and he'll be admitted to the hospitalist service for observation.    _________________________ 7:18 PM on 04/14/2017 -----------------------------------------  Dr. Tobi Bastos, GI aware. Did not recommend endoscopy at this time unless patient has further episodes of hematemesis or hgb is dropping. Patient remains HD stable and will be admitted to the  Hospitalist  Pertinent labs & imaging results that were available during my care of the patient were reviewed by me and considered in my medical decision making (see chart for details).    ____________________________________________   FINAL CLINICAL IMPRESSION(S) / ED DIAGNOSES  Final diagnoses:  Hematemesis with nausea  Alcoholic cirrhosis, unspecified whether ascites present (HCC)  Secondary esophageal varices with bleeding (HCC)      NEW MEDICATIONS STARTED DURING THIS VISIT:  New Prescriptions   No medications on file     Note:  This document was prepared using Dragon voice recognition software and may include unintentional dictation errors.    Don Perking, [place], MD 04/14/17 [phone]

## 2017-04-14 NOTE — ED Triage Notes (Signed)
Pt to ed with c/o vomiting x 1 this am, pt reports it looked like it had blood in it.  Pt reports hx of liver issues.  Pt reports continued nausea.  Pt also reports abd pain and discomfort.

## 2017-04-14 NOTE — ED Notes (Signed)
Pt stated that he felt like he was having a panic attack. Pt stated that he felt like his heart was beating out of chest. Pt HR in 40's on monitor. Dr. Don PerkingVeronese informed. She stated to slow octreotide down to 4125mcg/hr instead of 6450mcg/hr. After a few minutes pt stated he was feeling better.

## 2017-04-15 ENCOUNTER — Ambulatory Visit: Payer: Medicare Other | Admitting: Licensed Clinical Social Worker

## 2017-04-15 ENCOUNTER — Inpatient Hospital Stay: Payer: Medicare Other

## 2017-04-15 DIAGNOSIS — K920 Hematemesis: Secondary | ICD-10-CM

## 2017-04-15 DIAGNOSIS — K7030 Alcoholic cirrhosis of liver without ascites: Principal | ICD-10-CM

## 2017-04-15 LAB — HEMOGLOBIN
Hemoglobin: 11.5 g/dL — ABNORMAL LOW (ref 13.0–18.0)
Hemoglobin: 12 g/dL — ABNORMAL LOW (ref 13.0–18.0)

## 2017-04-15 LAB — BASIC METABOLIC PANEL
Anion gap: 9 (ref 5–15)
BUN: 13 mg/dL (ref 6–20)
CHLORIDE: 103 mmol/L (ref 101–111)
CO2: 26 mmol/L (ref 22–32)
Calcium: 8.8 mg/dL — ABNORMAL LOW (ref 8.9–10.3)
Creatinine, Ser: 0.91 mg/dL (ref 0.61–1.24)
GFR calc Af Amer: >60 mL/min (ref >60)
GFR calc non Af Amer: >60 mL/min (ref >60)
GLUCOSE: 109 mg/dL — AB (ref 65–99)
POTASSIUM: 4.1 mmol/L (ref 3.5–5.1)
SODIUM: 138 mmol/L (ref 135–145)

## 2017-04-15 LAB — GLUCOSE, CAPILLARY
GLUCOSE-CAPILLARY: 92 mg/dL (ref 65–99)
GLUCOSE-CAPILLARY: 86 mg/dL (ref 65–99)
Glucose-Capillary: 112 mg/dL — ABNORMAL HIGH (ref 65–99)
Glucose-Capillary: 128 mg/dL — ABNORMAL HIGH (ref 65–99)
Glucose-Capillary: 85 mg/dL (ref 65–99)

## 2017-04-15 MED ORDER — HYDROCORTISONE 1 % EX CREA
1.0000 "application " | TOPICAL_CREAM | Freq: Four times a day (QID) | CUTANEOUS | Status: DC
  Administered 2017-04-15 – 2017-04-16 (×5): 1 via TOPICAL
  Filled 2017-04-15: qty 28

## 2017-04-15 MED ORDER — ORAL CARE MOUTH RINSE
15.0000 mL | Freq: Two times a day (BID) | OROMUCOSAL | Status: DC
  Administered 2017-04-15 – 2017-04-16 (×3): 15 mL via OROMUCOSAL

## 2017-04-15 NOTE — Progress Notes (Addendum)
Initial Nutrition Assessment  DOCUMENTATION CODES:   Morbid obesity  INTERVENTION:  Continue MVI w/ minerals, Folic Acid, Thiamine Ensure Enlive po BID, each supplement provides 350 kcal and 20 grams of protein with diet advancement  NUTRITION DIAGNOSIS:   Inadequate oral intake related to chronic illness as evidenced by per patient/family report.  GOAL:   Patient will meet greater than or equal to 90% of their needs  MONITOR:   PO intake, I & O's, Labs, Supplement acceptance, Weight trends  REASON FOR ASSESSMENT:   Malnutrition Screening Tool    ASSESSMENT:   36 yo male with history of ETOH cirrhosis complicated by grace 1 varices and ascites, HTN, diabetes, schizophrenia, A-Fib, COPD presents with hematemesis with bright red blood, abdominal pain  Patient well known to our service. Returns with hematemesis. States he continues to eat a piece of bread in the morning and okra from [employer] in the evenings. Stopped drinking alcohol 9 days ago. Complains of ab pain and nausea with PO intake. Reports weight loss but none evident. Monitor for needs. ?psych issue, given history of schizophrenia Nutrition-Focused physical exam completed. Findings are no fat depletion, no muscle depletion, and no edema.  UOP 400mL last 12 hrs Labs reviewed Medications reviewed and include:  Novolog 0-9 Units TID, 0-5 Units HS Lactulose 30g QID  Diet Order:  Diet NPO time specified Except for: Other (See Comments), Sips with Meds  Skin:  Reviewed, no issues  Last BM:  04/14/2017  Height:   Ht Readings from Last 1 Encounters:  04/14/17 6\' 3"  (1.905 m)    Weight:   Wt Readings from Last 1 Encounters:  04/14/17 (!) 347 lb 12.8 oz (157.8 kg)    Ideal Body Weight:  89.09 kg  BMI:  Body mass index is 43.47 kg/m.  Estimated Nutritional Needs:   Kcal:  2600-3100 calories (25-30cal/kg ABW)  Protein:  190-220 grams (1.2-1.4g/kg)  Fluid:  >/= 2.6L  EDUCATION NEEDS:   No education needs  identified at this time  Dionne AnoWilliam M. Faithlynn Deeley, MS, RD LDN Inpatient Clinical Dietitian Pager [phone]

## 2017-04-15 NOTE — Telephone Encounter (Signed)
Spoke w/ Britta MccreedyBarbara from [facility]. She was requesting hemoccult cards for the pt. I informed her that we will leave them up front for her to pick up.

## 2017-04-15 NOTE — Progress Notes (Signed)
Fall risk and allergy armbands placed on patient.

## 2017-04-15 NOTE — Progress Notes (Signed)
Patient called out c/o feeling "dizzy headed" went to patients bedside NAD noted, patient A&OX4. CBG checked which showed blood glucose to be 92. Will continue to monitor.

## 2017-04-15 NOTE — Telephone Encounter (Signed)
Left message for patient to call back  

## 2017-04-15 NOTE — Consult Note (Signed)
[name] MD, MRCP(U.K) [address]  [address]  [place], [place] [address]  Main: [phone]  Fax: [phone]  Consultation  Referring Provider:     Dr Hilton SinclairWeiting  Primary Care Physician:  Galen ManilaKennedy, [name], NP Primary Gastroenterologist:  Dr Markham JordanElliot          Reason for Consultation:     Hematemesis  Date of Admission:  04/14/2017 Date of Consultation:  04/15/2017         HPI:   Howard [name] is a 36 y.o. 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 He was in fact discharged just a few days back when Dr Servando SnareWohl repeated his EGD which he has had done on 04/07/17 which showed non bleeding grade 1 varices, similarly no varices were seen in 10/2015 .   Hb has been stable similar to his last admission . No elevation of BUN, INR 1.17 . USG abdomen shows no ascites.   He says he threw up some blood a few days back and none since then including since hospitalization. He also says he had some rectal bleeding some days back but has not had a bowel movement since. Denies any abdominal pain. No NSAID use.   Component     Latest Ref Rng & Units 03/26/2017 03/29/2017 04/03/2017 04/05/2017  WBC     3.8 - 10.6 K/uL  6.8 7.7 6.5  RBC     4.40 - 5.90 MIL/uL  4.20 (L) 4.18 (L) 4.23 (L)  Hemoglobin     13.0 - 18.0 g/dL 44.011.3 (L) 10.212.0 (L) 72.512.1 (L) 12.2 (L)  HCT     40.0 - 52.0 % 35.1 (L) 37.0 (L) 37.1 (L) 37.5 (L)  MCV     80.0 - 100.0 fL  88.1 88.7 88.6  MCH     26.0 - 34.0 pg  28.5 29.0 28.8  MCHC     32.0 - 36.0 g/dL  36.632.3 44.032.7 34.732.5  RDW     11.5 - 14.5 %  21.6 (H) 20.5 (H) 20.3 (H)  Platelets     150 - 440 K/uL  117 (L) 143 (L) 146 (L)   Component     Latest Ref Rng & Units 04/06/2017 04/14/2017  WBC     3.8 - 10.6 K/uL 5.7 5.8  RBC     4.40 - 5.90 MIL/uL 3.94 (L) 4.00 (L)  Hemoglobin     13.0 - 18.0 g/dL 42.511.2 (L) 95.611.5 (L)  HCT     40.0 - 52.0 %  34.6 (L) 35.1 (L)  MCV     80.0 - 100.0 fL 87.7 87.7  MCH     26.0 - 34.0 pg 28.4 28.8  MCHC     32.0 - 36.0 g/dL 38.732.3 56.432.8  RDW     33.211.5 - 14.5 % 20.4 (H) 19.1 (H)  Platelets     150 - 440 K/uL 110 (L) 134 (L)    Past Medical History:  Diagnosis Date  . Alcoholic cirrhosis of liver with ascites (HCC)   . Alcoholism (HCC)   . Anemia   . Atrial fibrillation (HCC)   . COPD (chronic obstructive pulmonary disease) (HCC)   . Depression   . Diabetes mellitus, type II (HCC)   . Esophageal varices (HCC)   . Heart disease    irregular heart beat (palpitations) and heart murmur  . Hyperlipemia   .  Hypertension   . Liver disease   . Multiple thyroid nodules   . Portal hypertensive gastropathy (HCC)   . Schizophrenia (HCC)   . Seizures (HCC)     Past Surgical History:  Procedure Laterality Date  . ESOPHAGOGASTRODUODENOSCOPY N/A 10/05/2015   Procedure: ESOPHAGOGASTRODUODENOSCOPY (EGD);  Surgeon: Elnita MaxwellMatthew Gordon Rein, MD;  [facility];  Service: Endoscopy;  Laterality: N/A;  . ESOPHAGOGASTRODUODENOSCOPY (EGD) WITH PROPOFOL N/A 08/03/2015   Procedure: ESOPHAGOGASTRODUODENOSCOPY (EGD) WITH PROPOFOL;  Surgeon: Elnita MaxwellMatthew Gordon Rein, MD;  [facility];  Service: Endoscopy;  Laterality: N/A;  . ESOPHAGOGASTRODUODENOSCOPY (EGD) WITH PROPOFOL N/A 08/31/2015   Procedure: ESOPHAGOGASTRODUODENOSCOPY (EGD) WITH PROPOFOL;  Surgeon: Elnita MaxwellMatthew Gordon Rein, MD;  [facility];  Service: Endoscopy;  Laterality: N/A;  . ESOPHAGOGASTRODUODENOSCOPY (EGD) WITH PROPOFOL N/A 04/04/2016   Procedure: ESOPHAGOGASTRODUODENOSCOPY (EGD) WITH PROPOFOL;  Surgeon: Scot Junobert T Elliott, MD;  [facility];  Service: Endoscopy;  Laterality: N/A;  . ESOPHAGOGASTRODUODENOSCOPY (EGD) WITH PROPOFOL N/A 11/13/2016   Procedure: ESOPHAGOGASTRODUODENOSCOPY (EGD) WITH PROPOFOL;  Surgeon: Wyline MoodKiran Sianni Cloninger, MD;  Location: [facility] ENDOSCOPY;  Service: Endoscopy;  Laterality: N/A;  .  ESOPHAGOGASTRODUODENOSCOPY (EGD) WITH PROPOFOL N/A 04/07/2017   Procedure: ESOPHAGOGASTRODUODENOSCOPY (EGD) WITH PROPOFOL;  Surgeon: Midge MiniumWohl, Darren, MD;  Location: [facility] ENDOSCOPY;  Service: Endoscopy;  Laterality: N/A;  . NO PAST SURGERIES      Prior to Admission medications   Medication Sig Start Date End Date Taking? Authorizing Provider  acamprosate (CAMPRAL) 333 MG tablet Take 2 tablets (666 mg total) by mouth 3 (three) times daily with meals. 03/24/17   [name], Jackquline DenmarkJohn T, MD  albuterol (PROVENTIL HFA;VENTOLIN HFA) 108 (90 Base) MCG/ACT inhaler Inhale 2 puffs into the lungs every 6 (six) hours as needed for wheezing or shortness of breath. Patient taking differently: Inhale 2 puffs into the lungs every 4 (four) hours as needed for wheezing or shortness of breath.  10/08/16   Shaune Pollackhen, Qing, MD  clotrimazole (LOTRIMIN) 1 % external solution Apply 3-4 drops to each ear two times per day for 10 days. 04/09/17   Galen ManilaKennedy, [name], NP  docusate sodium (COLACE) 100 MG capsule Take 100 mg by mouth daily as needed for mild constipation.    [provider]  esomeprazole (NEXIUM) 40 MG capsule Take 1 capsule (40 mg total) by mouth daily. 04/03/17 04/03/18  Charlynne PanderYao, David Hsienta, MD  ferrous sulfate 325 (65 FE) MG tablet Take 3 tablets (975 mg total) by mouth every Monday, Wednesday, and Friday. With breakfast 01/21/17   Galen ManilaKennedy, [name], NP  folic acid (FOLVITE) 1 MG tablet Take 1 tablet (1 mg total) by mouth daily. 12/19/16   Gouru, Deanna ArtisAruna, MD  furosemide (LASIX) 20 MG tablet Take 20 mg by mouth daily.    [provider]  ibuprofen (ADVIL,MOTRIN) 600 MG tablet Take 1 tablet (600 mg total) by mouth every 8 (eight) hours as needed. 04/08/17   Merrily Brittleifenbark, Neil, MD  lactulose (CHRONULAC) 10 GM/15ML solution Take 45 mLs (30 g total) by mouth every 4 (four) hours. Patient taking differently: Take 30 g by mouth 4 (four) times daily.  07/09/16   Katharina CaperVaickute, Rima, MD  levETIRAcetam (KEPPRA) 750 MG tablet Take  2 tablets (1,500 mg total) by mouth 2 (two) times daily. 12/11/16 04/05/18  Merrily Brittleifenbark, Neil, MD  losartan (COZAAR) 25 MG tablet Take 1 tablet (25 mg total) by mouth daily. 03/11/17   Galen ManilaKennedy, [name], NP  lurasidone (LATUDA) 80 MG TABS tablet Take 1 tablet (80 mg total) by mouth daily with supper. 03/24/17  [name], [name], MD  metFORMIN (GLUCOPHAGE) 500 MG tablet TAKE 1 TABLET BY MOUTH 2 TIMES DAILY WITH MEALS 03/26/17   [name], NP  nadolol (CORGARD) 20 MG tablet Take 1 tablet (20 mg total) by mouth daily. 03/11/17   [name], NP  NON FORMULARY     [provider]  omeprazole (PRILOSEC) 40 MG capsule Take 40 mg by mouth daily.    [provider]  Oral Medication Containers (ALARM CLOCK PILL BOX) MISC 1 each by Does not apply route daily. 04/09/17   [name], NP  rifaximin (XIFAXAN) 550 MG TABS tablet Take 1 tablet (550 mg total) by mouth 2 (two) times daily. 07/09/16   Katharina Caper, MD  spironolactone (ALDACTONE) 50 MG tablet Take 1 tablet (50 mg total) by mouth 2 (two) times daily. 03/02/17   [name], NP  sucralfate (CARAFATE) 1 g tablet Take 1 tablet by mouth 4 (four) times daily.    [provider]  thiamine (VITAMIN B-1) 100 MG tablet Take 100 mg by mouth daily.    [provider]  traZODone (DESYREL) 100 MG tablet Take 1 tablet (100 mg total) by mouth at bedtime as needed for sleep. 03/24/17   [name], [name], MD    Family History  Problem Relation Age of Onset  . Heart disease Mother   . Hypertension Mother   . Hyperlipidemia Mother   . Stroke Father   . Heart attack Father   . Hypertension Father   . Heart disease Father   . Alcohol abuse Father   . Heart disease Brother      Social History  Substance Use Topics  . Smoking status: Never Smoker  . Smokeless tobacco: Never Used  . Alcohol use No    Allergies as of 04/14/2017 - Review Complete 04/14/2017  Allergen Reaction Noted  .  Tramadol Itching 04/03/2017    Review of Systems:    All systems reviewed and negative except where noted in HPI.   Physical Exam:  Vital signs in last 24 hours: Temp:  [98.5 F (36.9 C)-98.6 F (37 C)] 98.5 F (36.9 C) (07/25 0342) Pulse Rate:  [59-73] 73 (07/25 0342) Resp:  [15-28] 16 (07/25 0342) BP: (132-172)/(73-108) 149/86 (07/25 0342) SpO2:  [93 %-97 %] 93 % (07/25 0342) Weight:  [342 lb (155.1 kg)-347 lb 12.8 oz (157.8 kg)] 347 lb 12.8 oz (157.8 kg) (07/24 2145) Last BM Date: 04/14/17 General:   Pleasant, cooperative in NAD, sitting comfortably in bed watching TV Head:  Normocephalic and atraumatic. Eyes:   No icterus.   Conjunctiva pink. PERRLA. Ears:  Normal auditory acuity. Neck:  Supple; no masses or thyroidomegaly Lungs: Respirations even and unlabored. Lungs clear to auscultation bilaterally.   No wheezes, crackles, or rhonchi.  Heart:  Regular rate and rhythm;  Without murmur, clicks, rubs or gallops Abdomen:  Soft, nondistended, nontender. Normal bowel sounds. No appreciable masses or hepatomegaly.  No rebound or guarding.  Rectal:  Not performed. Extremities:  Without edema, cyanosis or clubbing. Neurologic:  Alert and oriented x3;  grossly normal neurologically. Cervical Nodes:  No significant cervical adenopathy. Psych:  Alert and cooperative. Normal affect.  LAB RESULTS:  Recent Labs  04/14/17 1435 04/14/17 2150 04/15/17 0612  WBC 5.8  --   --   HGB 11.5* 11.8* 11.5*  HCT 35.1*  --   --   PLT 134*  --   --    BMET  Recent Labs  04/14/17  1435 04/15/17 0612  NA 137 138  K 4.3 4.1  CL 102 103  CO2 27 26  GLUCOSE 98 109*  BUN 11 13  CREATININE 0.93 0.91  CALCIUM 9.3 8.8*   LFT  Recent Labs  04/14/17 1435  PROT 8.3*  ALBUMIN 3.8  AST 75*  ALT 52  ALKPHOS 98  BILITOT 0.9   PT/INR  Recent Labs  04/14/17 1751  LABPROT 15.0  INR 1.17    STUDIES: No results found.    Impression / Plan:   Howard [name] is a 36 y.o. y/o  male with history of alcoholic liver cirrhosis, multiple admissions with supposed hematemesis, none ever seen during the hospitalization, last EGD 04/07/17 when he was admitted with a similar complaint , EGD showed no bleeding . On this admission HB is at base line, No elevation of BUN, no documented hematemesis or melena overnight . He needs closer follow up as an out patient with Dr Markham Jordan to prevent recurrent hospitalizations. Other causes for possible  hemoptysis should be pursued as well - other causes for hemoptysis that are associated with cirrhosis are porto pulmonary hypertension or hepato pulmonary syndrome although his liver disease does not seem to be as severe. At this time from the GI point of view would suggest keep NPO , observe for 24 hours for any signs of bleeding . Only ice chips to be allowed. If Hb stable and no clinical evidence of a GI bleed then there is no need for an endoscopy. Can consider increasing nadolol to 40 mg a day at discharge . He has no ascites, no encephalopathy .  Continue lactulose and xifaxan .  Thank you for involving me in the care of this patient.      LOS: 1 day   Wyline Mood, MD  04/15/2017, 10:29 AM

## 2017-04-15 NOTE — Care Management (Signed)
Admitted to [facility] with the diagnosis of hematemesis. (Re-admit). Lives with mother. Last seen Dr. Kyung RuddKennedy 04/09/17. Followed by [facility] in the home. Chronic Home oxygen  per [facility]. Rolling walker and cane in the home.  Ultrasound of the abdomen ordered per Dr. Renae GlossWieting. May need paracentesis. Gwenette GreetBrenda S Vannia Pola RN MSN CCM Care Management [phone]

## 2017-04-15 NOTE — Progress Notes (Signed)
Patient ID: Howard [name], male   DOB: [birth year], 36 y.o.   MRN: [id number]  [employer] PROGRESS NOTE  Howard LinemanJoey A [name] WUJ:[id number] DOB: [birth year] DOA: 04/14/2017 PCP: Galen ManilaKennedy, [name], NP  HPI/Subjective: Patient had no further vomiting up blood. Abdominal pain is less  Objective: Vitals:   04/15/17 1132 04/15/17 1400  BP: (!) 152/96 (!) 148/84  Pulse: 65 78  Resp:  16  Temp:  98.6 F (37 C)    Filed Weights   04/14/17 1420 04/14/17 2145  Weight: (!) 155.1 kg (342 lb) (!) 157.8 kg (347 lb 12.8 oz)    ROS: Review of Systems  Constitutional: Negative for chills and fever.  Eyes: Negative for blurred vision.  Respiratory: Negative for cough and shortness of breath.   Cardiovascular: Negative for chest pain.  Gastrointestinal: Positive for abdominal pain. Negative for constipation, diarrhea, nausea and vomiting.  Genitourinary: Negative for dysuria.  Musculoskeletal: Negative for joint pain.  Neurological: Negative for dizziness and headaches.   Exam: Physical Exam  HENT:  Nose: No mucosal edema.  Mouth/Throat: No oropharyngeal exudate or posterior oropharyngeal edema.  Eyes: Pupils are equal, round, and reactive to light. Conjunctivae, EOM and lids are normal.  Neck: No JVD present. Carotid bruit is not present. No edema present. No thyroid mass and no thyromegaly present.  Cardiovascular: S1 normal and S2 normal.  Exam reveals no gallop.   No murmur heard. Pulses:      Dorsalis pedis pulses are 2+ on the right side, and 2+ on the left side.  Respiratory: No respiratory distress. He has decreased breath sounds in the right lower field and the left lower field. He has no wheezes. He has no rhonchi. He has no rales.  GI: Soft. Bowel sounds are normal. He exhibits distension. There is tenderness.  Musculoskeletal:       Right ankle: He exhibits swelling.       Left ankle: He exhibits swelling.  Lymphadenopathy:    He has no cervical adenopathy.  Neurological:  He is alert. No cranial nerve deficit.  Skin: Skin is warm. No rash noted. Nails show no clubbing.  Psychiatric: He has a normal mood and affect.      Data Reviewed: Basic Metabolic Panel:  Recent Labs Lab 04/14/17 1435 04/15/17 0612  NA 137 138  K 4.3 4.1  CL 102 103  CO2 27 26  GLUCOSE 98 109*  BUN 11 13  CREATININE 0.93 0.91  CALCIUM 9.3 8.8*   Liver Function Tests:  Recent Labs Lab 04/14/17 1435  AST 75*  ALT 52  ALKPHOS 98  BILITOT 0.9  PROT 8.3*  ALBUMIN 3.8    Recent Labs Lab 04/14/17 1435  LIPASE 31    Recent Labs Lab 04/14/17 2150  AMMONIA 71*   CBC:  Recent Labs Lab 04/14/17 1435 04/14/17 2150 04/15/17 0612 04/15/17 1332  WBC 5.8  --   --   --   HGB 11.5* 11.8* 11.5* 12.0*  HCT 35.1*  --   --   --   MCV 87.7  --   --   --   PLT 134*  --   --   --    BNP (last 3 results)  Recent Labs  10/04/16 1440 01/18/17 1319  BNP 53.0 50.0     CBG:  Recent Labs Lab 04/14/17 2146 04/15/17 0733 04/15/17 1130  GLUCAP 82 112* 128*      Studies: [place] Abdomen Limited  Result Date: 04/15/2017 CLINICAL DATA:  36 year old  male with a history of cirrhosis and recurrent ascites. EXAM: LIMITED ABDOMEN ULTRASOUND FOR ASCITES TECHNIQUE: Limited ultrasound survey for ascites was performed in all four abdominal quadrants. COMPARISON:  Most recent ultrasound study 12/19/2016. FINDINGS: Limited ultrasound of the abdomen demonstrates no ascites. IMPRESSION: Limited ultrasound demonstrates no ascites within the abdomen. Electronically Signed   By: Gilmer MorJaime  Wagner D.O.   On: 04/15/2017 10:29    Scheduled Meds: . acamprosate  666 mg Oral TID WC  . folic acid  1 mg Oral Daily  . furosemide  20 mg Oral Daily  . hydrocortisone cream  1 application Topical QID  . insulin aspart  0-5 Units Subcutaneous QHS  . insulin aspart  0-9 Units Subcutaneous TID WC  . lactulose  30 g Oral QID  . levETIRAcetam  1,500 mg Oral BID  . losartan  25 mg Oral Daily  .  lurasidone  80 mg Oral Q supper  . mouth rinse  15 mL Mouth Rinse BID  . nadolol  20 mg Oral Daily  . [START ON 04/18/2017] pantoprazole  40 mg Intravenous Q12H  . rifaximin  550 mg Oral BID  . spironolactone  50 mg Oral BID  . sucralfate  1 g Oral QID  . thiamine  100 mg Oral Daily   Continuous Infusions: . cefTRIAXone (ROCEPHIN)  IV Stopped (04/15/17 0139)  . octreotide  (SANDOSTATIN)    IV infusion 50 mcg/hr (04/15/17 1140)  . pantoprozole (PROTONIX) infusion 8 mg/hr (04/15/17 0706)    Assessment/Plan:  1. Hematemesis with history of esophageal varices. On octreotide and Protonix drips. Empiric Rocephin. GI recommends nothing by mouth today and start diet tomorrow. 2. Abdominal pain. No ascites seen on ultrasound of the abdomen. We'll get a bladder scan today. 3. Alcoholic cirrhosis. Patient states he stopped drinking 10 days ago. Continue lactulose and Xifaxan for history of hepatic encephalopathy 4. Essential hypertension. Continue his usual medications 5. Seizure disorder on Keppra 6. Type 2 diabetes mellitus. Hold Glucophage and place on sliding scale  Code Status:     Code Status Orders        Start     Ordered   04/14/17 1948  Full code  Continuous     04/14/17 1947    Code Status History    Date Active Date Inactive Code Status Order ID Comments User Context   04/06/2017  2:17 AM 04/07/2017  4:14 PM Full Code [id number]  Oralia ManisWillis, David, MD Inpatient   03/26/2017  8:25 AM 03/27/2017  5:25 PM Full Code [id number]  Ihor AustinPyreddy, Pavan, MD Inpatient   01/18/2017  4:35 PM 01/19/2017  3:16 PM Full Code [id number]  Marguarite ArbourSparks, Jeffrey D, MD Inpatient   01/05/2017  2:57 PM 01/05/2017  9:46 PM Full Code [id number]  Arnaldo NatalMalinda, Paul F, MD ED   12/30/2016  2:51 AM 01/01/2017  2:33 PM Full Code [id number]  Oralia ManisWillis, David, MD Inpatient   12/18/2016 12:25 AM 12/19/2016  9:28 PM Full Code [id number]  Katharina CaperVaickute, Rima, MD Inpatient   12/12/2016 11:40 PM 12/15/2016  8:28 PM Full Code [id number]  Arnaldo Nataliamond, Michael S, MD  Inpatient   11/24/2016  1:11 AM 11/25/2016  4:42 PM Full Code [id number]  Hugelmeyer, Jon GillsAlexis, DO Inpatient   11/12/2016  3:36 PM 11/14/2016  2:36 PM Full Code [id number]  Auburn BilberryPatel, Shreyang, MD Inpatient   10/04/2016  6:04 PM 10/08/2016  3:44 PM Full Code [id number]  Shaune Pollackhen, Qing, MD Inpatient   09/22/2016  2:29 AM 09/23/2016  6:38 PM Full Code [id number]  Anne HahnWillis,  Onalee Hua, MD Inpatient   08/07/2016  7:37 PM 08/12/2016  8:51 PM Full Code [id number]  Gracelyn Nurse, MD Inpatient   07/07/2016  1:40 AM 07/09/2016  6:29 PM Full Code [id number]  Ihor Austin, MD Inpatient   06/10/2016  6:21 AM 06/11/2016  8:25 PM Full Code [id number]  Ihor Austin, MD Inpatient   05/12/2016  3:55 AM 05/15/2016  2:57 PM Full Code [id number]  Ihor Austin, MD Inpatient   04/28/2016  2:54 AM 04/29/2016  6:23 PM Full Code [id number]  Hugelmeyer, Alexis, DO Inpatient   03/14/2016 12:30 PM 03/16/2016  3:41 PM Full Code [id number]  Shaune Pollack, MD Inpatient   03/03/2016 12:13 PM 03/06/2016  2:26 PM Full Code [id number]  Milagros Loll, MD ED   02/10/2016  5:54 PM 02/12/2016  7:34 PM Full Code [id number]  Ramonita Lab, MD Inpatient   11/11/2015  5:16 AM 11/13/2015  6:00 PM Full Code [id number]  Ihor Austin, MD Inpatient   11/07/2015 10:02 PM 11/09/2015  4:56 PM Full Code [id number]  Enid Baas, MD Inpatient   10/28/2015  8:14 PM 10/29/2015  2:26 PM Full Code [id number]  Altamese Dilling, MD Inpatient   10/16/2015 11:47 PM 10/17/2015  6:00 PM Full Code [id number]  Oralia Manis, MD Inpatient   07/30/2015  5:35 PM 08/04/2015  8:34 PM Full Code [id number]  Enedina Finner, MD Inpatient   07/05/2015  1:16 AM 07/08/2015  7:22 PM Full Code [id number]  Oralia Manis, MD Inpatient   05/30/2015  7:28 PM 06/01/2015  3:36 PM Full Code [id number]  [name], [name], MD Inpatient     Family Communication: Mother yesterday Disposition Plan: Home potentially tomorrow  Antibiotics:  Rocephin  Time spent: 25 minutes  Alford Highland  [employer]

## 2017-04-15 NOTE — Evaluation (Signed)
Physical Therapy Evaluation Patient Details Name: Howard [name] MRN: [id number] DOB: 21-Jan-1981 Today's Date: 04/15/2017   History of Present Illness  Howard [name] is a 36 y.o. male with a known history of alcoholic cirrhosis presents with hematemesis. Upon arrival he was also complaining of 10 out of 10 abdominal pain in his right abdomen lasting 3-4 seconds and a dull ache 4 out of 10 intensity after that. He states it is a doubling over pain stabbing type pain when it 10 out of 10 in intensity and he can't take it anymore. Hospitalist services were contacted for further evaluation. Of note the patient had an upper endoscopy 04/07/2017 which showed grade 1 esophageal varices. Patient states that he quit alcohol 9 days ago. At time of PT evaluation he is complaining of 4/10 stomach pain  Clinical Impression  Pt admitted with above diagnosis. Pt currently with functional limitations due to the deficits listed below (see PT Problem List). Pt able to perform bed mobility at modified independent level and requires CGA only for transfers and ambulation. Pt demonstrates good stability with ambulation. Constant 2L/min O2 required during ambulation and SaO2 remains at or above 90%. At times he picks up walker demonstrating that he really doesn't rely heavily on walker for balance. Once he has almost returned to room pt starts to stumble and he is more delayed in his responses. He reports feeling lightheaded so chair brought behind him. Vitals and BG obtained both of which are within acceptable limits. Pt wheeled back to room and left upright in recliner with RN administering his scheduled medications. Pt is safe to return home at discharge and resume HH PT which was arranged prior to his admission. Pt will benefit from PT services to address deficits in strength, balance, and mobility in order to return to full function at home.     Follow Up Recommendations Home health PT;Other (comment) ([facility])    Equipment Recommendations  None recommended by PT    Recommendations for Other Services       Precautions / Restrictions Precautions Precautions: Fall Restrictions Weight Bearing Restrictions: No      Mobility  Bed Mobility Overal bed mobility: Modified Independent             General bed mobility comments: HOB elevated and use of bed rails for mobility. Slight increase in time  Transfers Overall transfer level: Needs assistance Equipment used: Rolling walker (2 wheeled) Transfers: Sit to/from Stand Sit to Stand: Min guard         General transfer comment: Pt requires 2 attempts to come to standing but is steady once upright. Multiple transfers performed from bed and recliner  Ambulation/Gait Ambulation/Gait assistance: Min guard Ambulation Distance (Feet): 175 Feet Assistive device: Rolling walker (2 wheeled) Gait Pattern/deviations: WFL(Within Functional Limits) Gait velocity: Decreased Gait velocity interpretation: <1.8 ft/sec, indicative of risk for recurrent falls General Gait Details: Pt demonstrates good stability with ambulation. Constant 2L/min O2 required during ambulation and SaO2 remains at or above 90%. At times he picks up walker demonstrating that he really doesn't rely heavily on walker for balance. Once we have almost returned to room pt starts to stumble and he is more delayed in his responses. Pt reports feeling lightheaded so chair brought behind him. Vitals and BG obtained both of which are within acceptable limits. Pt wheeled back to room  Stairs            Wheelchair Mobility  Modified Rankin (Stroke Patients Only)       Balance Overall balance assessment: Needs assistance Sitting-balance support: No upper extremity supported Sitting balance-Leahy Scale: Good     Standing balance support: No upper extremity supported Standing balance-Leahy Scale: Fair Standing balance comment: Able to  maintain static stance balance without UE support                             Pertinent Vitals/Pain Pain Assessment: 0-10 Pain Score: 4  Pain Location: Abdominal pain Pain Intervention(s): Monitored during session    Home Living Family/patient expects to be discharged to:: Private residence Living Arrangements: Parent;Other relatives;Other (Comment) (Brother) Available Help at Discharge: Available 24 hours/day;Family Type of Home: House Home Access: Stairs to enter Entrance Stairs-Rails: Right;Left;Can reach both Entrance Stairs-Number of Steps: 5 Home Layout: Multi-level;Able to live on main level with bedroom/bathroom Home Equipment: Dan Humphreys - 2 wheels;Cane - quad;Shower seat (no grab bars)      Prior Function Level of Independence: Independent with assistive device(s);Needs assistance   Gait / Transfers Assistance Needed: Pt reports that he ambulates with rolling walker at baseline  ADL's / Homemaking Assistance Needed: Independent with ADLs, assist from family with IADLs        Hand Dominance   Dominant Hand: Right    Extremity/Trunk Assessment   Upper Extremity Assessment Upper Extremity Assessment: Overall WFL for tasks assessed    Lower Extremity Assessment Lower Extremity Assessment: Generalized weakness       Communication   Communication: No difficulties  Cognition Arousal/Alertness: Awake/alert Behavior During Therapy: Flat affect Overall Cognitive Status: Within Functional Limits for tasks assessed                                 General Comments: AOx4, flat affect and sometimes response is mildly delayed      General Comments      Exercises     Assessment/Plan    PT Assessment Patient needs continued PT services  PT Problem List Decreased strength;Decreased activity tolerance       PT Treatment Interventions DME instruction;Gait training;Functional mobility training;Stair training;Therapeutic  activities;Therapeutic exercise;Balance training;Neuromuscular re-education;Patient/family education    PT Goals (Current goals can be found in the Care Plan section)  Acute Rehab PT Goals Patient Stated Goal: Return to prior function at home PT Goal Formulation: With patient Time For Goal Achievement: 04/29/17 Potential to Achieve Goals: Good    Frequency Min 2X/week   Barriers to discharge        Co-evaluation               AM-PAC PT "6 Clicks" Daily Activity  Outcome Measure Difficulty turning over in bed (including adjusting bedclothes, sheets and blankets)?: A Little Difficulty moving from lying on back to sitting on the side of the bed? : A Little Difficulty sitting down on and standing up from a chair with arms (e.g., wheelchair, bedside commode, etc,.)?: A Little Help needed moving to and from a bed to chair (including a wheelchair)?: A Little Help needed walking in hospital room?: A Little Help needed climbing 3-5 steps with a railing? : A Little 6 Click Score: 18    End of Session Equipment Utilized During Treatment: Gait belt;Oxygen Activity Tolerance: Patient tolerated treatment well Patient left: with call bell/phone within reach;in chair;with chair alarm set Nurse Communication: Mobility status;Other (comment) (Pt left in recliner) PT Visit  Diagnosis: Unsteadiness on feet (R26.81);Muscle weakness (generalized) (M62.81);Pain Pain - part of body:  (Abdominal pain)    Time: 1117-1140 PT Time Calculation (min) (ACUTE ONLY): 23 min   Charges:   PT Evaluation $PT Eval Low Complexity: 1 Procedure PT Treatments $Gait Training: 8-22 mins   PT G Codes:   PT G-Codes **NOT FOR INPATIENT CLASS** Functional Assessment Tool Used: AM-PAC 6 Clicks Basic Mobility Functional Limitation: Mobility: Walking and moving around Mobility: Walking and Moving Around Current Status ([id number](G8978): At least 40 percent but less than 60 percent impaired, limited or restricted Mobility:  Walking and Moving Around Goal Status [phone](G8979): At least 20 percent but less than 40 percent impaired, limited or restricted    Lynnea MaizesJason D [name] Uecker PT, DPT    Sebastian Dzik 04/15/2017, 12:36 PM

## 2017-04-16 LAB — BASIC METABOLIC PANEL
ANION GAP: 8 (ref 5–15)
BUN: 13 mg/dL (ref 6–20)
CO2: 31 mmol/L (ref 22–32)
Calcium: 9.2 mg/dL (ref 8.9–10.3)
Chloride: 102 mmol/L (ref 101–111)
Creatinine, Ser: 0.98 mg/dL (ref 0.61–1.24)
GFR calc Af Amer: >60 mL/min (ref >60)
GFR calc non Af Amer: >60 mL/min (ref >60)
GLUCOSE: 91 mg/dL (ref 65–99)
POTASSIUM: 3.9 mmol/L (ref 3.5–5.1)
Sodium: 141 mmol/L (ref 135–145)

## 2017-04-16 LAB — AMMONIA: Ammonia: 39 umol/L — ABNORMAL HIGH (ref 9–35)

## 2017-04-16 LAB — GLUCOSE, CAPILLARY
GLUCOSE-CAPILLARY: 102 mg/dL — AB (ref 65–99)
Glucose-Capillary: 100 mg/dL — ABNORMAL HIGH (ref 65–99)
Glucose-Capillary: 116 mg/dL — ABNORMAL HIGH (ref 65–99)

## 2017-04-16 LAB — HEMOGLOBIN: Hemoglobin: 11.8 g/dL — ABNORMAL LOW (ref 13.0–18.0)

## 2017-04-16 MED ORDER — LACTULOSE 10 GM/15ML PO SOLN: 30.0000 g | Freq: Four times a day (QID) | ORAL | 0 refills | Status: DC

## 2017-04-16 MED ORDER — ENSURE ENLIVE PO LIQD: 237.0000 mL | Freq: Two times a day (BID) | ORAL | Status: DC

## 2017-04-16 MED ORDER — ENSURE ENLIVE PO LIQD: 237.0000 mL | Freq: Two times a day (BID) | ORAL | 0 refills | Status: DC

## 2017-04-16 MED ORDER — FOLIC ACID 1 MG PO TABS: 1.0000 mg | ORAL_TABLET | Freq: Every day | ORAL | 0 refills | Status: DC

## 2017-04-16 MED ORDER — OMEPRAZOLE 40 MG PO CPDR: 40.0000 mg | DELAYED_RELEASE_CAPSULE | Freq: Two times a day (BID) | ORAL | 0 refills | Status: DC

## 2017-04-16 NOTE — Discharge Summary (Signed)
[facility] [place] [facility]   PATIENT NAME: Howard [name]    MR#:  [id number]  DATE OF BIRTH:  03-04-1981  DATE OF ADMISSION:  04/14/2017 ADMITTING PHYSICIAN: Alford Highlandichard Lorriane Dehart, MD  DATE OF DISCHARGE: 04/16/2017  PRIMARY CARE PHYSICIAN: Galen ManilaKennedy, [name], NP    ADMISSION DIAGNOSIS:  Secondary esophageal varices with bleeding (HCC) [I85.11] Abdominal pain [R10.9] Hematemesis with nausea [K92.0] Alcoholic cirrhosis, unspecified whether ascites present (HCC) [K70.30]  DISCHARGE DIAGNOSIS:  Active Problems:   Hematemesis   SECONDARY DIAGNOSIS:   Past Medical History:  Diagnosis Date  . Alcoholic cirrhosis of liver with ascites (HCC)   . Alcoholism (HCC)   . Anemia   . Atrial fibrillation (HCC)   . COPD (chronic obstructive pulmonary disease) (HCC)   . Depression   . Diabetes mellitus, type II (HCC)   . Esophageal varices (HCC)   . Heart disease    irregular heart beat (palpitations) and heart murmur  . Hyperlipemia   . Hypertension   . Liver disease   . Multiple thyroid nodules   . Portal hypertensive gastropathy (HCC)   . Schizophrenia (HCC)   . Seizures (HCC)     HOSPITAL COURSE:   1. Hematemesis with history of esophageal varices. The patient's hemoglobin remained stable during the entire hospital course. The patient had no further hematemesis. He was on Protonix drip and octreotide drips during the hospital course. Continue omeprazole and Carafate as outpatient. Patient had recent endoscopy. 2. Abdominal pain. This has resolved. Ultrasound of the abdomen did not show any ascites. 3. Alcoholic cirrhosis. Patient stated he Stopped drinking 11 days ago. In order to be a transplant candidate he must have stopped drinking for over a year. 4. Hepatic encephalopathy. Ammonia level better today. Continue lactulose and Xifaxan 5. Essential hypertension. Continue usual medications 6. Seizure disorder on Keppra 7. Type 2 diabetes mellitus can go back  on usual medications  DISCHARGE CONDITIONS:   Fair  CONSULTS OBTAINED:   gastroenterology  DRUG ALLERGIES:   Allergies  Allergen Reactions  . Tramadol Itching    DISCHARGE MEDICATIONS:   Current Discharge Medication List    START taking these medications   Details  feeding supplement, ENSURE ENLIVE, (ENSURE ENLIVE) LIQD Take 237 mLs by mouth 2 (two) times daily between meals. Qty: 60 Bottle, Refills: 0      CONTINUE these medications which have CHANGED   Details  folic acid (FOLVITE) 1 MG tablet Take 1 tablet (1 mg total) by mouth daily. Qty: 30 tablet, Refills: 0    lactulose (CHRONULAC) 10 GM/15ML solution Take 45 mLs (30 g total) by mouth 4 (four) times daily. Qty: 5676 mL, Refills: 0    omeprazole (PRILOSEC) 40 MG capsule Take 1 capsule (40 mg total) by mouth 2 (two) times daily. Qty: 60 capsule, Refills: 0      CONTINUE these medications which have NOT CHANGED   Details  acamprosate (CAMPRAL) 333 MG tablet Take 2 tablets (666 mg total) by mouth 3 (three) times daily with meals. Qty: 180 tablet, Refills: 5    albuterol (PROVENTIL HFA;VENTOLIN HFA) 108 (90 Base) MCG/ACT inhaler Inhale 2 puffs into the lungs every 6 (six) hours as needed for wheezing or shortness of breath. Qty: 1 Inhaler, Refills: 2    citalopram (CELEXA) 20 MG tablet Take 20 mg by mouth daily.    clotrimazole (LOTRIMIN) 1 % external solution Apply 3-4 drops to each ear two times per day for 10 days. Qty: 30 mL, Refills: 0  Associated Diagnoses: Other infective acute otitis externa of both ears    ferrous sulfate 325 (65 FE) MG tablet Take 3 tablets (975 mg total) by mouth every Monday, Wednesday, and Friday. With breakfast Qty: 36 tablet, Refills: 5    furosemide (LASIX) 20 MG tablet Take 20 mg by mouth daily.    levETIRAcetam (KEPPRA) 750 MG tablet Take 2 tablets (1,500 mg total) by mouth 2 (two) times daily. Qty: 120 tablet, Refills: 0    losartan (COZAAR) 25 MG tablet Take 1 tablet  (25 mg total) by mouth daily. Qty: 30 tablet, Refills: 2    lurasidone (LATUDA) 80 MG TABS tablet Take 1 tablet (80 mg total) by mouth daily with supper. Qty: 30 tablet, Refills: 5    metFORMIN (GLUCOPHAGE) 500 MG tablet TAKE 1 TABLET BY MOUTH 2 TIMES DAILY WITH MEALS Qty: 60 tablet, Refills: 3    nadolol (CORGARD) 20 MG tablet Take 1 tablet (20 mg total) by mouth daily. Qty: 30 tablet, Refills: 2    NON FORMULARY     Oral Medication Containers (ALARM CLOCK PILL BOX) MISC 1 each by Does not apply route daily. Qty: 1 each, Refills: 0   Associated Diagnoses: Nonadherence to medication    rifaximin (XIFAXAN) 550 MG TABS tablet Take 1 tablet (550 mg total) by mouth 2 (two) times daily. Qty: 60 tablet, Refills: 5    spironolactone (ALDACTONE) 50 MG tablet Take 1 tablet (50 mg total) by mouth 2 (two) times daily. Qty: 60 tablet, Refills: 5    sucralfate (CARAFATE) 1 g tablet Take 1 tablet by mouth 4 (four) times daily.    thiamine (VITAMIN B-1) 100 MG tablet Take 100 mg by mouth daily.    traZODone (DESYREL) 100 MG tablet Take 1 tablet (100 mg total) by mouth at bedtime as needed for sleep. Qty: 30 tablet, Refills: 5      STOP taking these medications     docusate sodium (COLACE) 100 MG capsule      ibuprofen (ADVIL,MOTRIN) 600 MG tablet      esomeprazole (NEXIUM) 40 MG capsule          DISCHARGE INSTRUCTIONS:   Follow-up PMD one week  If you experience worsening of your admission symptoms, develop shortness of breath, life threatening emergency, suicidal or homicidal thoughts you must seek medical attention immediately by calling 911 or calling your MD immediately  if symptoms less severe.  You Must read complete instructions/literature along with all the possible adverse reactions/side effects for all the Medicines you take and that have been prescribed to you. Take any new Medicines after you have completely understood and accept all the possible adverse reactions/side  effects.   Please note  You were cared for by a hospitalist during your hospital stay. If you have any questions about your discharge medications or the care you received while you were in the hospital after you are discharged, you can call the unit and asked to speak with the hospitalist on call if the hospitalist that took care of you is not available. Once you are discharged, your primary care physician will handle any further medical issues. Please note that NO REFILLS for any discharge medications will be authorized once you are discharged, as it is imperative that you return to your primary care physician (or establish a relationship with a primary care physician if you do not have one) for your aftercare needs so that they can reassess your need for medications and monitor your lab values.  Today   CHIEF COMPLAINT:   Chief Complaint  Patient presents with  . Hematemesis    HISTORY OF PRESENT ILLNESS:  Howard BowlerJoey Cullinane  is a 36 y.o. male came in with hematemesis. Patient has a history of esophageal varices and liver cirrhosis   VITAL SIGNS:  Blood pressure 132/70, pulse 66, temperature 98.4 F (36.9 C), temperature source Oral, resp. rate 18, height 6\' 3"  (1.905 m), weight (!) 157.8 kg (347 lb 12.8 oz), SpO2 92 %.   PHYSICAL EXAMINATION:  GENERAL:  36 y.o.-year-old patient lying in the bed with no acute distress.  EYES: Pupils equal, round, reactive to light and accommodation. No scleral icterus. Extraocular muscles intact.  HEENT: Head atraumatic, normocephalic. Oropharynx and nasopharynx clear.  NECK:  Supple, no jugular venous distention. No thyroid enlargement, no tenderness.  LUNGS: Normal breath sounds bilaterally, no wheezing, rales,rhonchi or crepitation. No use of accessory muscles of respiration.  CARDIOVASCULAR: S1, S2 normal. No murmurs, rubs, or gallops.  ABDOMEN: Soft, non-tender, non-distended. Bowel sounds present. No organomegaly or mass.  EXTREMITIES: No pedal  edema, cyanosis, or clubbing.  NEUROLOGIC: Cranial nerves II through XII are intact. Muscle strength 5/5 in all extremities. Sensation intact. Gait not checked.  PSYCHIATRIC: The patient is alert and oriented x 3.  SKIN: No obvious rash, lesion, or ulcer.   DATA REVIEW:   CBC  Recent Labs Lab 04/14/17 1435  04/16/17 0357  WBC 5.8  --   --   HGB 11.5*  < > 11.8*  HCT 35.1*  --   --   PLT 134*  --   --   < > = values in this interval not displayed.  Chemistries   Recent Labs Lab 04/14/17 1435  04/16/17 0357  NA 137  < > 141  K 4.3  < > 3.9  CL 102  < > 102  CO2 27  < > 31  GLUCOSE 98  < > 91  BUN 11  < > 13  CREATININE 0.93  < > 0.98  CALCIUM 9.3  < > 9.2  AST 75*  --   --   ALT 52  --   --   ALKPHOS 98  --   --   BILITOT 0.9  --   --   < > = values in this interval not displayed.   Microbiology Results  Results for orders placed or performed during the hospital encounter of 03/25/17  MRSA PCR Screening     Status: None   Collection Time: 03/26/17  8:30 AM  Result Value Ref Range Status   MRSA by PCR NEGATIVE NEGATIVE Final    Comment:        The GeneXpert MRSA Assay (FDA approved for NASAL specimens only), is one component of a comprehensive MRSA colonization surveillance program. It is not intended to diagnose MRSA infection nor to guide or monitor treatment for MRSA infections.     RADIOLOGY:  [place] Abdomen Limited  Result Date: 04/15/2017 CLINICAL DATA:  36 year old male with a history of cirrhosis and recurrent ascites. EXAM: LIMITED ABDOMEN ULTRASOUND FOR ASCITES TECHNIQUE: Limited ultrasound survey for ascites was performed in all four abdominal quadrants. COMPARISON:  Most recent ultrasound study 12/19/2016. FINDINGS: Limited ultrasound of the abdomen demonstrates no ascites. IMPRESSION: Limited ultrasound demonstrates no ascites within the abdomen. Electronically Signed   By: Gilmer MorJaime  Wagner D.O.   On: 04/15/2017 10:29     Management plans discussed  with the patient, family and they are in agreement.  CODE  STATUS:     Code Status Orders        Start     Ordered   04/14/17 1948  Full code  Continuous     04/14/17 1947    Code Status History    Date Active Date Inactive Code Status Order ID Comments User Context   04/06/2017  2:17 AM 04/07/2017  4:14 PM Full Code [id number]  Oralia Manis, MD Inpatient   03/26/2017  8:25 AM 03/27/2017  5:25 PM Full Code [id number]  Ihor Austin, MD Inpatient   01/18/2017  4:35 PM 01/19/2017  3:16 PM Full Code [id number]  Marguarite Arbour, MD Inpatient   01/05/2017  2:57 PM 01/05/2017  9:46 PM Full Code [id number]  Arnaldo Natal, MD ED   12/30/2016  2:51 AM 01/01/2017  2:33 PM Full Code [id number]  Oralia Manis, MD Inpatient   12/18/2016 12:25 AM 12/19/2016  9:28 PM Full Code [id number]  Katharina Caper, MD Inpatient   12/12/2016 11:40 PM 12/15/2016  8:28 PM Full Code [id number]  Arnaldo Natal, MD Inpatient   11/24/2016  1:11 AM 11/25/2016  4:42 PM Full Code [id number]  Hugelmeyer, Jon Gills, DO Inpatient   11/12/2016  3:36 PM 11/14/2016  2:36 PM Full Code [id number]  Auburn Bilberry, MD Inpatient   10/04/2016  6:04 PM 10/08/2016  3:44 PM Full Code [id number]  Shaune Pollack, MD Inpatient   09/22/2016  2:29 AM 09/23/2016  6:38 PM Full Code [id number]  Oralia Manis, MD Inpatient   08/07/2016  7:37 PM 08/12/2016  8:51 PM Full Code [id number]  Gracelyn Nurse, MD Inpatient   07/07/2016  1:40 AM 07/09/2016  6:29 PM Full Code [id number]  Ihor Austin, MD Inpatient   06/10/2016  6:21 AM 06/11/2016  8:25 PM Full Code [id number]  Ihor Austin, MD Inpatient   05/12/2016  3:55 AM 05/15/2016  2:57 PM Full Code [id number]  Ihor Austin, MD Inpatient   04/28/2016  2:54 AM 04/29/2016  6:23 PM Full Code [id number]  Hugelmeyer, Alexis, DO Inpatient   03/14/2016 12:30 PM 03/16/2016  3:41 PM Full Code [id number]  Shaune Pollack, MD Inpatient   03/03/2016 12:13 PM 03/06/2016  2:26 PM Full Code [id number]  Milagros Loll, MD ED   02/10/2016  5:54 PM 02/12/2016  7:34 PM  Full Code [id number]  Ramonita Lab, MD Inpatient   11/11/2015  5:16 AM 11/13/2015  6:00 PM Full Code [id number]  Ihor Austin, MD Inpatient   11/07/2015 10:02 PM 11/09/2015  4:56 PM Full Code [id number]  Enid Baas, MD Inpatient   10/28/2015  8:14 PM 10/29/2015  2:26 PM Full Code [id number]  Altamese Dilling, MD Inpatient   10/16/2015 11:47 PM 10/17/2015  6:00 PM Full Code [id number]  Oralia Manis, MD Inpatient   07/30/2015  5:35 PM 08/04/2015  8:34 PM Full Code [id number]  Enedina Finner, MD Inpatient   07/05/2015  1:16 AM 07/08/2015  7:22 PM Full Code [id number]  Oralia Manis, MD Inpatient   05/30/2015  7:28 PM 06/01/2015  3:36 PM Full Code [id number]  [name], [name], MD Inpatient      TOTAL TIME TAKING CARE OF THIS PATIENT: 35 minutes.    Alford Highland M.D on 04/16/2017 at 2:06 PM  Between 7am to 6pm - Pager - [phone]  After 6pm go to [email or web address] - password [email or web address]  [facility] Office  [phone]  CC: Primary care physician; [name], NP

## 2017-04-16 NOTE — Progress Notes (Signed)
Patient called out c/o feeling "dizzy". Patient A&Ox4, no apparent distress noted. Vital signs taken. CBG checked. Blood glucose 100. Will continue to monitor.

## 2017-04-16 NOTE — Progress Notes (Signed)
Pt is being discharged today, discharge instructions reviewed with the patient and his mother. All belongings packed and returned to him. IV x2 removed. 4 paper prescriptions given to him. He was rolled out in a wheelchair by staff.

## 2017-04-17 ENCOUNTER — Ambulatory Visit: Payer: Self-pay | Admitting: Nurse Practitioner

## 2017-04-17 ENCOUNTER — Emergency Department: Payer: Medicare Other

## 2017-04-17 ENCOUNTER — Encounter: Payer: Self-pay | Admitting: Emergency Medicine

## 2017-04-17 ENCOUNTER — Emergency Department
Admission: EM | Admit: 2017-04-17 | Discharge: 2017-04-18 | Disposition: A | Discharge status: Home / Self Care | Payer: Medicare Other | Attending: Student in an Organized Health Care Education/Training Program | Admitting: Student in an Organized Health Care Education/Training Program

## 2017-04-17 DIAGNOSIS — J449 Chronic obstructive pulmonary disease, unspecified: Secondary | ICD-10-CM | POA: Diagnosis not present

## 2017-04-17 DIAGNOSIS — E119 Type 2 diabetes mellitus without complications: Secondary | ICD-10-CM | POA: Diagnosis not present

## 2017-04-17 DIAGNOSIS — Z79899 Other long term (current) drug therapy: Secondary | ICD-10-CM | POA: Insufficient documentation

## 2017-04-17 DIAGNOSIS — Z7984 Long term (current) use of oral hypoglycemic drugs: Secondary | ICD-10-CM | POA: Insufficient documentation

## 2017-04-17 DIAGNOSIS — R55 Syncope and collapse: Secondary | ICD-10-CM | POA: Insufficient documentation

## 2017-04-17 DIAGNOSIS — I10 Essential (primary) hypertension: Secondary | ICD-10-CM | POA: Diagnosis not present

## 2017-04-17 LAB — BASIC METABOLIC PANEL
ANION GAP: 11 (ref 5–15)
BUN: 20 mg/dL (ref 6–20)
CALCIUM: 9 mg/dL (ref 8.9–10.3)
CO2: 27 mmol/L (ref 22–32)
Chloride: 100 mmol/L — ABNORMAL LOW (ref 101–111)
Creatinine, Ser: 1.49 mg/dL — ABNORMAL HIGH (ref 0.61–1.24)
GFR, EST NON AFRICAN AMERICAN: 58 mL/min — AB (ref >60)
Glucose, Bld: 91 mg/dL (ref 65–99)
Potassium: 3.9 mmol/L (ref 3.5–5.1)
SODIUM: 138 mmol/L (ref 135–145)

## 2017-04-17 LAB — HEPATIC FUNCTION PANEL
ALBUMIN: 4 g/dL (ref 3.5–5.0)
ALT: 46 U/L (ref 17–63)
AST: 58 U/L — AB (ref 15–41)
Alkaline Phosphatase: 87 U/L (ref 38–126)
Bilirubin, Direct: 0.1 mg/dL (ref 0.1–0.5)
Indirect Bilirubin: 0.8 mg/dL (ref 0.3–0.9)
TOTAL PROTEIN: 8.4 g/dL — AB (ref 6.5–8.1)
Total Bilirubin: 0.9 mg/dL (ref 0.3–1.2)

## 2017-04-17 LAB — CBC
HCT: 38.3 % — ABNORMAL LOW (ref 40.0–52.0)
HEMOGLOBIN: 12.5 g/dL — AB (ref 13.0–18.0)
MCH: 28.7 pg (ref 26.0–34.0)
MCHC: 32.5 g/dL (ref 32.0–36.0)
MCV: 88.3 fL (ref 80.0–100.0)
PLATELETS: 164 10*3/uL (ref 150–440)
RBC: 4.34 MIL/uL — AB (ref 4.40–5.90)
RDW: 19.1 % — ABNORMAL HIGH (ref 11.5–14.5)
WBC: 9.1 10*3/uL (ref 3.8–10.6)

## 2017-04-17 LAB — URINALYSIS, COMPLETE (UACMP) WITH MICROSCOPIC
Bilirubin Urine: NEGATIVE
Glucose, UA: NEGATIVE mg/dL
Ketones, ur: NEGATIVE mg/dL
LEUKOCYTES UA: NEGATIVE
NITRITE: NEGATIVE
PROTEIN: 30 mg/dL — AB
SPECIFIC GRAVITY, URINE: 1.018 (ref 1.005–1.030)
pH: 5 (ref 5.0–8.0)

## 2017-04-17 LAB — TROPONIN I

## 2017-04-17 MED ORDER — SODIUM CHLORIDE 0.9 % IV BOLUS (SEPSIS)
250.0000 mL | Freq: Once | INTRAVENOUS | Status: AC
  Administered 2017-04-17: 250 mL via INTRAVENOUS

## 2017-04-17 MED ORDER — ALBUMIN HUMAN 25 % IV SOLN
12.5000 g | Freq: Once | INTRAVENOUS | Status: AC
  Administered 2017-04-17: 12.5 g via INTRAVENOUS
  Filled 2017-04-17: qty 50

## 2017-04-17 NOTE — ED Provider Notes (Signed)
[facility] Emergency Department Provider Note    First MD Initiated Contact with Patient 04/17/17 2032     (approximate)  I have reviewed the triage vital signs and the nursing notes.   HISTORY  Chief Complaint Loss of Consciousness    HPI Howard [name] is a 36 y.o. male with history of alcoholic cirrhosis presents with 3 fainting spells that occurred today while the patient was sitting a lot. Does admit to decreased oral intake today. Denies any chest pain or shortness of breath. Denies any abdominal pain. Denies any numbness or tingling. Didn't fall or hit his head. Denies any other complaints at this time.   Past Medical History:  Diagnosis Date  . Alcoholic cirrhosis of liver with ascites (HCC)   . Alcoholism (HCC)   . Anemia   . Atrial fibrillation (HCC)   . COPD (chronic obstructive pulmonary disease) (HCC)   . Depression   . Diabetes mellitus, type II (HCC)   . Esophageal varices (HCC)   . Heart disease    irregular heart beat (palpitations) and heart murmur  . Hyperlipemia   . Hypertension   . Liver disease   . Multiple thyroid nodules   . Portal hypertensive gastropathy (HCC)   . Schizophrenia (HCC)   . Seizures (HCC)    Family History  Problem Relation Age of Onset  . Heart disease Mother   . Hypertension Mother   . Hyperlipidemia Mother   . Stroke Father   . Heart attack Father   . Hypertension Father   . Heart disease Father   . Alcohol abuse Father   . Heart disease Brother    Past Surgical History:  Procedure Laterality Date  . ESOPHAGOGASTRODUODENOSCOPY N/A 10/05/2015   Procedure: ESOPHAGOGASTRODUODENOSCOPY (EGD);  Surgeon: Elnita MaxwellMatthew Gordon Rein, MD;  Location[facility] ENDOSCOPY;  Service: Endoscopy;  Laterality: N/A;  . ESOPHAGOGASTRODUODENOSCOPY (EGD) WITH PROPOFOL N/A 08/03/2015   Procedure: ESOPHAGOGASTRODUODENOSCOPY (EGD) WITH PROPOFOL;  Surgeon: Elnita MaxwellMatthew Gordon Rein, MD;  [facility]COPY;  Service: Endoscopy;   Laterality: N/A;  . ESOPHAGOGASTRODUODENOSCOPY (EGD) WITH PROPOFOL N/A 08/31/2015   Procedure: ESOPHAGOGASTRODUODENOSCOPY (EGD) WITH PROPOFOL;  Surgeon: Elnita MaxwellMatthew Gordon Rein, MD;  [facility];  Service: Endoscopy;  Laterality: N/A;  . ESOPHAGOGASTRODUODENOSCOPY (EGD) WITH PROPOFOL N/A 04/04/2016   Procedure: ESOPHAGOGASTRODUODENOSCOPY (EGD) WITH PROPOFOL;  Surgeon: Scot Junobert T Elliott, MD;  Location[facility] ENDOSCOPY;  Service: Endoscopy;  Laterality: N/A;  . ESOPHAGOGASTRODUODENOSCOPY (EGD) WITH PROPOFOL N/A 11/13/2016   Procedure: ESOPHAGOGASTRODUODENOSCOPY (EGD) WITH PROPOFOL;  Surgeon: Wyline MoodKiran Anna, MD;  Location: [facility] ENDOSCOPY;  Service: Endoscopy;  Laterality: N/A;  . ESOPHAGOGASTRODUODENOSCOPY (EGD) WITH PROPOFOL N/A 04/07/2017   Procedure: ESOPHAGOGASTRODUODENOSCOPY (EGD) WITH PROPOFOL;  Surgeon: Midge MiniumWohl, Darren, MD;  Location: [facility] ENDOSCOPY;  Service: Endoscopy;  Laterality: N/A;  . NO PAST SURGERIES     Patient Active Problem List   Diagnosis Date Noted  . Esophageal varices without bleeding (HCC)   . Hematemesis 04/05/2017  . Upper GI bleed   . Iron deficiency anemia due to chronic blood loss 02/16/2017  . Nausea 01/18/2017  . Respiratory failure with hypoxia (HCC) 12/14/2016  . Seizures (HCC) 12/12/2016  . DNR (do not resuscitate) discussion 08/11/2016  . Muscle weakness (generalized)   . GI bleed 08/07/2016  . OSA (obstructive sleep apnea) 04/29/2016  . Anemia 04/29/2016  . Thrombocytopenia (HCC) 04/29/2016  . Coagulopathy (HCC) 04/29/2016  . Obesity 04/29/2016  . Controlled type 2 diabetes mellitus without complication (HCC) 01/15/2016  . Essential (primary) hypertension 12/11/2015  .  Pure hypercholesterolemia 12/11/2015  . Hepatic encephalopathy (HCC) 10/16/2015  . 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 Elevated transaminase level 07/04/2015  . Alcohol abuse 05/31/2015  . Paranoid schizophrenia (HCC) 03/20/2015      Prior to Admission  medications   Medication Sig Start Date End Date Taking? Authorizing Provider  acamprosate (CAMPRAL) 333 MG tablet Take 2 tablets (666 mg total) by mouth 3 (three) times daily with meals. 03/24/17  Yes [name], Jackquline DenmarkJohn T, MD  albuterol (PROVENTIL HFA;VENTOLIN HFA) 108 (90 Base) MCG/ACT inhaler Inhale 2 puffs into the lungs every 6 (six) hours as needed for wheezing or shortness of breath. Patient taking differently: Inhale 2 puffs into the lungs every 4 (four) hours as needed for wheezing or shortness of breath.  10/08/16  Yes Shaune Pollackhen, Qing, MD  citalopram (CELEXA) 20 MG tablet Take 20 mg by mouth daily. 04/07/17  Yes [provider]  clotrimazole (LOTRIMIN) 1 % external solution Apply 3-4 drops to each ear two times per day for 10 days. 04/09/17  Yes Galen ManilaKennedy, [name], NP  feeding supplement, ENSURE ENLIVE, (ENSURE ENLIVE) LIQD Take 237 mLs by mouth 2 (two) times daily between meals. 04/16/17  Yes Alford HighlandWieting, Richard, MD  ferrous sulfate 325 (65 FE) MG tablet Take 3 tablets (975 mg total) by mouth every Monday, Wednesday, and Friday. With breakfast 01/21/17  Yes Galen ManilaKennedy, [name], NP  folic acid (FOLVITE) 1 MG tablet Take 1 tablet (1 mg total) by mouth daily. 04/16/17  Yes Wieting, Richard, MD  furosemide (LASIX) 20 MG tablet Take 20 mg by mouth daily.   Yes [provider]  lactulose (CHRONULAC) 10 GM/15ML solution Take 45 mLs (30 g total) by mouth 4 (four) times daily. 04/16/17  Yes Alford HighlandWieting, Richard, MD  levETIRAcetam (KEPPRA) 750 MG tablet Take 2 tablets (1,500 mg total) by mouth 2 (two) times daily. 12/11/16 04/05/18 Yes Merrily Brittleifenbark, Neil, MD  losartan (COZAAR) 25 MG tablet Take 1 tablet (25 mg total) by mouth daily. 03/11/17  Yes Galen ManilaKennedy, [name], NP  lurasidone (LATUDA) 80 MG TABS tablet Take 1 tablet (80 mg total) by mouth daily with supper. 03/24/17  Yes [name], Jackquline DenmarkJohn T, MD  metFORMIN (GLUCOPHAGE) 500 MG tablet TAKE 1 TABLET BY MOUTH 2 TIMES DAILY WITH MEALS 03/26/17  Yes Galen ManilaKennedy, Lauren  Renee, NP  nadolol (CORGARD) 20 MG tablet Take 1 tablet (20 mg total) by mouth daily. 03/11/17  Yes Galen ManilaKennedy, [name], NP  NON FORMULARY    Yes [provider]  omeprazole (PRILOSEC) 40 MG capsule Take 1 capsule (40 mg total) by mouth 2 (two) times daily. 04/16/17  Yes Alford HighlandWieting, Richard, MD  Oral Medication Containers (ALARM CLOCK PILL BOX) MISC 1 each by Does not apply route daily. 04/09/17  Yes Galen ManilaKennedy, [name], NP  rifaximin (XIFAXAN) 550 MG TABS tablet Take 1 tablet (550 mg total) by mouth 2 (two) times daily. 07/09/16  Yes Katharina CaperVaickute, Rima, MD  spironolactone (ALDACTONE) 50 MG tablet Take 1 tablet (50 mg total) by mouth 2 (two) times daily. 03/02/17  Yes Galen ManilaKennedy, [name], NP  sucralfate (CARAFATE) 1 g tablet Take 1 tablet by mouth 4 (four) times daily.   Yes [provider]  thiamine (VITAMIN B-1) 100 MG tablet Take 100 mg by mouth daily.   Yes [provider]  traZODone (DESYREL) 100 MG tablet Take 1 tablet (100 mg total) by mouth at bedtime as needed for sleep. 03/24/17  Yes [name], Jackquline DenmarkJohn T, MD    Allergies  Tramadol    Social History Social History  Substance Use Topics  . Smoking status: Never Smoker  . Smokeless tobacco: Never Used  . Alcohol use No    Review of Systems Patient denies headaches, rhinorrhea, blurry vision, numbness, shortness of breath, chest pain, edema, cough, abdominal pain, nausea, vomiting, diarrhea, dysuria, fevers, rashes or hallucinations unless otherwise stated above in HPI. ____________________________________________   PHYSICAL EXAM:  VITAL SIGNS: Vitals:   04/17/17 2049 04/17/17 2100  BP: 138/74 127/70  Pulse: (!) 56 63  Resp:  20  Temp:      Constitutional: Alert and oriented. Chronically ill  appearing and in no acute distress. Eyes: Conjunctivae are normal.  Head: Atraumatic. Nose: No congestion/rhinnorhea. Mouth/Throat: Mucous membranes are moist.   Neck: No stridor. Painless ROM.  Cardiovascular:  Normal rate, regular rhythm. Grossly normal heart sounds.  Good peripheral circulation. Respiratory: Normal respiratory effort.  No retractions. Lungs CTAB. Gastrointestinal: Soft and nontender. No distention. No abdominal bruits. No CVA tenderness. Musculoskeletal: No lower extremity tenderness. No joint effusions. Neurologic:  Normal speech and language. No gross focal neurologic deficits are appreciated. No facial droop Skin:  Skin is warm, dry and intact. No rash noted. Psychiatric: Mood and affect are normal. Speech and behavior are normal.  ____________________________________________   LABS (all labs ordered are listed, but only abnormal results are displayed)  Results for orders placed or performed during the hospital encounter of 04/17/17 (from the past 24 hour(s))  Basic metabolic panel     Status: Abnormal   Collection Time: 04/17/17  6:04 PM  Result Value Ref Range   Sodium 138 135 - 145 mmol/L   Potassium 3.9 3.5 - 5.1 mmol/L   Chloride 100 (L) 101 - 111 mmol/L   CO2 27 22 - 32 mmol/L   Glucose, Bld 91 65 - 99 mg/dL   BUN 20 6 - 20 mg/dL   Creatinine, Ser 1.61 (H) 0.61 - 1.24 mg/dL   Calcium 9.0 8.9 - 09.6 mg/dL   GFR calc non Af Amer 58 (L) >60 mL/min   GFR calc Af Amer >60 >60 mL/min   Anion gap 11 5 - 15  CBC     Status: Abnormal   Collection Time: 04/17/17  6:04 PM  Result Value Ref Range   WBC 9.1 3.8 - 10.6 K/uL   RBC 4.34 (L) 4.40 - 5.90 MIL/uL   Hemoglobin 12.5 (L) 13.0 - 18.0 g/dL   HCT 04.5 (L) 40.9 - 81.1 %   MCV 88.3 80.0 - 100.0 fL   MCH 28.7 26.0 - 34.0 pg   MCHC 32.5 32.0 - 36.0 g/dL   RDW 91.4 (H) 78.2 - 95.6 %   Platelets 164 150 - 440 K/uL  Troponin I     Status: None   Collection Time: 04/17/17  6:04 PM  Result Value Ref Range   Troponin I <0.03 <0.03 ng/mL  Hepatic function panel     Status: Abnormal   Collection Time: 04/17/17  6:04 PM  Result Value Ref Range   Total Protein 8.4 (H) 6.5 - 8.1 g/dL   Albumin 4.0 3.5 - 5.0 g/dL   AST 58  (H) 15 - 41 U/L   ALT 46 17 - 63 U/L   Alkaline Phosphatase 87 38 - 126 U/L   Total Bilirubin 0.9 0.3 - 1.2 mg/dL   Bilirubin, Direct 0.1 0.1 - 0.5 mg/dL   Indirect Bilirubin 0.8 0.3 - 0.9 mg/dL  Urinalysis, Complete w Microscopic  Status: Abnormal   Collection Time: 04/17/17  9:02 PM  Result Value Ref Range   Color, Urine AMBER (A) YELLOW   APPearance CLOUDY (A) CLEAR   Specific Gravity, Urine 1.018 1.005 - 1.030   pH 5.0 5.0 - 8.0   Glucose, UA NEGATIVE NEGATIVE mg/dL   Hgb urine dipstick LARGE (A) NEGATIVE   Bilirubin Urine NEGATIVE NEGATIVE   Ketones, ur NEGATIVE NEGATIVE mg/dL   Protein, ur 30 (A) NEGATIVE mg/dL   Nitrite NEGATIVE NEGATIVE   Leukocytes, UA NEGATIVE NEGATIVE   ____________________________________________  EKG My review and personal interpretation at Time: 18:11   Indication: syncope  Rate: 65  Rhythm: sinus Axis: normal Other: no stemi, no brugada, wpw or prolonged QT ____________________________________________  RADIOLOGY  I personally reviewed all radiographic images ordered to evaluate for the above acute complaints and reviewed radiology reports and findings.  These findings were personally discussed with the patient.  Please see medical record for radiology report.  ____________________________________________   PROCEDURES  Procedure(s) performed:  Procedures    Critical Care performed: no ____________________________________________   INITIAL IMPRESSION / ASSESSMENT AND PLAN / ED COURSE  Pertinent labs & imaging results that were available during my care of the patient were reviewed by me and considered in my medical decision making (see chart for details).  DDX: dehydration, dysrhythmia, acs, anemia, seizure, ich  Howard [name] is a 36 y.o. who presents to the ED with presenting after syncopal episode. Patient was hemodynamically stable upon arrival. The patient was immediately placed on a cardiac monitor. Patient found to have  normal cardiac and neurologic exam.  Lack of palpitations or chest pain preceding the event as well as normal ECG with no evidence of ischemic changes, QT prolongation or delta wave make dysrhythmia causes less likely.  Very low suspicion for PE given history of event and lack of SOB, hypoxia, tachycardia or significant risk factors. Acute blood loss 2/2 acute GI bleed unlikely as cause given hemoccult negative stools. Hgb was stable compared to previous values. Orthostatic causes are possible. Syncope may have been due to hypovolemia from dehydration, given patient has not been drinking plenty of fluids, is  dizzy when rising to stand abruptly. Patient was notorthostatic in ED. Syncope does not appear to be due to seizure, as no witnessed seizure activity and no post-ictal period after regaining consciousness. No ongoing neurologic deficits to suggest cerebrovascular accident. No ongoing headache to raise concern for [facility].  CT head negative. Vasovagal syncope possible, but patient did not endorse feeling flushed, weak, or dizzy and did not micturate just prior to event.       ____________________________________________   FINAL CLINICAL IMPRESSION(S) / ED DIAGNOSES  Final diagnoses:  Fainting spell      NEW MEDICATIONS STARTED DURING THIS VISIT:  New Prescriptions   No medications on file     Note:  This document was prepared using Dragon voice recognition software and may include unintentional dictation errors.    Willy Eddy, MD 04/18/17 [phone]

## 2017-04-17 NOTE — ED Triage Notes (Signed)
Pt to ED via POV, pt states that he has had 3 syncopal episodes today. Pt states that he hit his head on the table. States that he is not on blood thinners but has liver disease. Pt states that he was just [facility] yesterday for complications with liver. Pt does not appear to be in any distress at this time.

## 2017-04-17 NOTE — ED Notes (Signed)
Pt going to CT

## 2017-04-17 NOTE — ED Notes (Signed)
Pharmacy called about albumin solution for the patient.

## 2017-04-17 NOTE — ED Notes (Signed)
Pt provided with a sandwich tray and diet ginger ale per MD order.

## 2017-04-18 NOTE — ED Notes (Signed)

## 2017-04-19 ENCOUNTER — Observation Stay
Admission: EM | Admit: 2017-04-19 | Discharge: 2017-04-20 | Disposition: A | Discharge status: Home / Self Care | Payer: Medicare Other | Attending: Internal Medicine | Admitting: Internal Medicine

## 2017-04-19 DIAGNOSIS — J449 Chronic obstructive pulmonary disease, unspecified: Secondary | ICD-10-CM | POA: Insufficient documentation

## 2017-04-19 DIAGNOSIS — Z79899 Other long term (current) drug therapy: Secondary | ICD-10-CM | POA: Diagnosis not present

## 2017-04-19 DIAGNOSIS — G40909 Epilepsy, unspecified, not intractable, without status epilepticus: Secondary | ICD-10-CM | POA: Diagnosis not present

## 2017-04-19 DIAGNOSIS — I8501 Esophageal varices with bleeding: Secondary | ICD-10-CM

## 2017-04-19 DIAGNOSIS — E119 Type 2 diabetes mellitus without complications: Secondary | ICD-10-CM | POA: Diagnosis not present

## 2017-04-19 DIAGNOSIS — E785 Hyperlipidemia, unspecified: Secondary | ICD-10-CM | POA: Insufficient documentation

## 2017-04-19 DIAGNOSIS — K7031 Alcoholic cirrhosis of liver with ascites: Secondary | ICD-10-CM | POA: Diagnosis not present

## 2017-04-19 DIAGNOSIS — I10 Essential (primary) hypertension: Secondary | ICD-10-CM | POA: Diagnosis not present

## 2017-04-19 DIAGNOSIS — F329 Major depressive disorder, single episode, unspecified: Secondary | ICD-10-CM | POA: Diagnosis not present

## 2017-04-19 DIAGNOSIS — I4891 Unspecified atrial fibrillation: Secondary | ICD-10-CM | POA: Insufficient documentation

## 2017-04-19 DIAGNOSIS — K7290 Hepatic failure, unspecified without coma: Principal | ICD-10-CM | POA: Insufficient documentation

## 2017-04-19 DIAGNOSIS — K766 Portal hypertension: Secondary | ICD-10-CM | POA: Diagnosis not present

## 2017-04-19 DIAGNOSIS — I8511 Secondary esophageal varices with bleeding: Secondary | ICD-10-CM | POA: Insufficient documentation

## 2017-04-19 DIAGNOSIS — K7682 Hepatic encephalopathy: Secondary | ICD-10-CM

## 2017-04-19 DIAGNOSIS — Z9114 Patient's other noncompliance with medication regimen: Secondary | ICD-10-CM | POA: Insufficient documentation

## 2017-04-19 DIAGNOSIS — K920 Hematemesis: Secondary | ICD-10-CM | POA: Diagnosis present

## 2017-04-19 DIAGNOSIS — N179 Acute kidney failure, unspecified: Secondary | ICD-10-CM | POA: Diagnosis present

## 2017-04-19 DIAGNOSIS — Z7984 Long term (current) use of oral hypoglycemic drugs: Secondary | ICD-10-CM | POA: Insufficient documentation

## 2017-04-19 DIAGNOSIS — F1020 Alcohol dependence, uncomplicated: Secondary | ICD-10-CM | POA: Insufficient documentation

## 2017-04-19 LAB — COMPREHENSIVE METABOLIC PANEL
ALBUMIN: 3.9 g/dL (ref 3.5–5.0)
ALT: 52 U/L (ref 17–63)
ANION GAP: 10 (ref 5–15)
AST: 65 U/L — ABNORMAL HIGH (ref 15–41)
Alkaline Phosphatase: 86 U/L (ref 38–126)
BUN: 27 mg/dL — ABNORMAL HIGH (ref 6–20)
CHLORIDE: 104 mmol/L (ref 101–111)
CO2: 25 mmol/L (ref 22–32)
Calcium: 9 mg/dL (ref 8.9–10.3)
Creatinine, Ser: 1.69 mg/dL — ABNORMAL HIGH (ref 0.61–1.24)
GFR calc non Af Amer: 50 mL/min — ABNORMAL LOW (ref >60)
GFR, EST AFRICAN AMERICAN: 58 mL/min — AB (ref >60)
GLUCOSE: 166 mg/dL — AB (ref 65–99)
POTASSIUM: 4.2 mmol/L (ref 3.5–5.1)
SODIUM: 139 mmol/L (ref 135–145)
Total Bilirubin: 0.8 mg/dL (ref 0.3–1.2)
Total Protein: 8.2 g/dL — ABNORMAL HIGH (ref 6.5–8.1)

## 2017-04-19 LAB — PROTIME-INR
INR: 1.25
Prothrombin Time: 15.8 seconds — ABNORMAL HIGH (ref 11.4–15.2)

## 2017-04-19 LAB — CBC
HEMATOCRIT: 35.1 % — AB (ref 40.0–52.0)
HEMOGLOBIN: 11.6 g/dL — AB (ref 13.0–18.0)
MCH: 29 pg (ref 26.0–34.0)
MCHC: 32.9 g/dL (ref 32.0–36.0)
MCV: 88.1 fL (ref 80.0–100.0)
Platelets: 146 10*3/uL — ABNORMAL LOW (ref 150–440)
RBC: 3.99 MIL/uL — AB (ref 4.40–5.90)
RDW: 18.7 % — ABNORMAL HIGH (ref 11.5–14.5)
WBC: 6.6 10*3/uL (ref 3.8–10.6)

## 2017-04-19 LAB — GLUCOSE, CAPILLARY
Glucose-Capillary: 93 mg/dL (ref 65–99)
Glucose-Capillary: 119 mg/dL — ABNORMAL HIGH (ref 65–99)

## 2017-04-19 LAB — TYPE AND SCREEN
ABO/RH(D): O POS
Antibody Screen: NEGATIVE

## 2017-04-19 LAB — AMMONIA: AMMONIA: 118 umol/L — AB (ref 9–35)

## 2017-04-19 LAB — HEMOGLOBIN: Hemoglobin: 12.2 g/dL — ABNORMAL LOW (ref 13.0–18.0)

## 2017-04-19 LAB — LIPASE, BLOOD: LIPASE: 41 U/L (ref 11–51)

## 2017-04-19 MED ORDER — PANTOPRAZOLE SODIUM 40 MG IV SOLR
40.0000 mg | Freq: Two times a day (BID) | INTRAVENOUS | Status: DC
  Administered 2017-04-19 – 2017-04-20 (×2): 40 mg via INTRAVENOUS
  Filled 2017-04-19 (×2): qty 40

## 2017-04-19 MED ORDER — ENSURE ENLIVE PO LIQD
237.0000 mL | Freq: Two times a day (BID) | ORAL | Status: DC
  Administered 2017-04-20: 237 mL via ORAL

## 2017-04-19 MED ORDER — SODIUM CHLORIDE 0.9 % IV SOLN: 250.0000 mL | INTRAVENOUS | Status: DC | PRN

## 2017-04-19 MED ORDER — OXYCODONE HCL 5 MG PO TABS: 5.0000 mg | ORAL_TABLET | ORAL | Status: DC | PRN

## 2017-04-19 MED ORDER — LACTULOSE 10 GM/15ML PO SOLN
30.0000 g | Freq: Four times a day (QID) | ORAL | Status: DC
  Administered 2017-04-19 – 2017-04-20 (×3): 30 g via ORAL
  Filled 2017-04-19 (×3): qty 60

## 2017-04-19 MED ORDER — PANTOPRAZOLE SODIUM 40 MG IV SOLR: 40.0000 mg | Freq: Two times a day (BID) | INTRAVENOUS | Status: DC

## 2017-04-19 MED ORDER — ALBUTEROL SULFATE (2.5 MG/3ML) 0.083% IN NEBU: 2.5000 mg | INHALATION_SOLUTION | Freq: Four times a day (QID) | RESPIRATORY_TRACT | Status: DC | PRN

## 2017-04-19 MED ORDER — INSULIN ASPART 100 UNIT/ML ~~LOC~~ SOLN: 0.0000 [IU] | Freq: Every day | SUBCUTANEOUS | Status: DC

## 2017-04-19 MED ORDER — SPIRONOLACTONE 25 MG PO TABS
50.0000 mg | ORAL_TABLET | Freq: Two times a day (BID) | ORAL | Status: DC
  Administered 2017-04-19 – 2017-04-20 (×2): 50 mg via ORAL
  Filled 2017-04-19 (×2): qty 2

## 2017-04-19 MED ORDER — RIFAXIMIN 550 MG PO TABS
550.0000 mg | ORAL_TABLET | Freq: Two times a day (BID) | ORAL | Status: DC
  Administered 2017-04-19 – 2017-04-20 (×2): 550 mg via ORAL
  Filled 2017-04-19 (×2): qty 1

## 2017-04-19 MED ORDER — LACTULOSE 10 GM/15ML PO SOLN
30.0000 g | Freq: Once | ORAL | Status: AC
  Administered 2017-04-19: 30 g via ORAL
  Filled 2017-04-19: qty 60

## 2017-04-19 MED ORDER — CITALOPRAM HYDROBROMIDE 20 MG PO TABS
20.0000 mg | ORAL_TABLET | Freq: Every day | ORAL | Status: DC
  Administered 2017-04-20: 20 mg via ORAL
  Filled 2017-04-19: qty 1

## 2017-04-19 MED ORDER — TRAZODONE HCL 100 MG PO TABS: 100.0000 mg | ORAL_TABLET | Freq: Every evening | ORAL | Status: DC | PRN

## 2017-04-19 MED ORDER — PANTOPRAZOLE SODIUM 40 MG IV SOLR
80.0000 mg | Freq: Once | INTRAVENOUS | Status: AC
  Administered 2017-04-19: 14:00:00 80 mg via INTRAVENOUS
  Filled 2017-04-19: qty 80

## 2017-04-19 MED ORDER — OXYCODONE HCL 5 MG PO TABS
5.0000 mg | ORAL_TABLET | Freq: Once | ORAL | Status: AC
  Administered 2017-04-19: 5 mg via ORAL
  Filled 2017-04-19: qty 1

## 2017-04-19 MED ORDER — ONDANSETRON HCL 4 MG/2ML IJ SOLN
4.0000 mg | Freq: Four times a day (QID) | INTRAMUSCULAR | Status: DC | PRN
  Administered 2017-04-19: 4 mg via INTRAVENOUS
  Filled 2017-04-19: qty 2

## 2017-04-19 MED ORDER — VITAMIN B-1 100 MG PO TABS
100.0000 mg | ORAL_TABLET | Freq: Every day | ORAL | Status: DC
  Administered 2017-04-19 – 2017-04-20 (×2): 100 mg via ORAL
  Filled 2017-04-19 (×2): qty 1

## 2017-04-19 MED ORDER — SODIUM CHLORIDE 0.9% FLUSH: 3.0000 mL | INTRAVENOUS | Status: DC | PRN

## 2017-04-19 MED ORDER — SUCRALFATE 1 G PO TABS
1.0000 g | ORAL_TABLET | Freq: Four times a day (QID) | ORAL | Status: DC
  Administered 2017-04-19 – 2017-04-20 (×3): 1 g via ORAL
  Filled 2017-04-19 (×3): qty 1

## 2017-04-19 MED ORDER — SODIUM CHLORIDE 0.9 % IV SOLN
8.0000 mg/h | INTRAVENOUS | Status: DC
  Administered 2017-04-19 – 2017-04-20 (×2): 8 mg/h via INTRAVENOUS
  Filled 2017-04-19 (×2): qty 80

## 2017-04-19 MED ORDER — SODIUM CHLORIDE 0.9 % IV SOLN
50.0000 ug/h | INTRAVENOUS | Status: DC
  Administered 2017-04-19 (×2): 50 ug/h via INTRAVENOUS
  Filled 2017-04-19 (×5): qty 1

## 2017-04-19 MED ORDER — ACAMPROSATE CALCIUM 333 MG PO TBEC
666.0000 mg | DELAYED_RELEASE_TABLET | Freq: Three times a day (TID) | ORAL | Status: DC
  Administered 2017-04-19 – 2017-04-20 (×3): 666 mg via ORAL
  Filled 2017-04-19 (×4): qty 2

## 2017-04-19 MED ORDER — INSULIN ASPART 100 UNIT/ML ~~LOC~~ SOLN
0.0000 [IU] | Freq: Three times a day (TID) | SUBCUTANEOUS | Status: DC
  Administered 2017-04-20: 1 [IU] via SUBCUTANEOUS
  Filled 2017-04-19: qty 1

## 2017-04-19 MED ORDER — FERROUS SULFATE 325 (65 FE) MG PO TABS
975.0000 mg | ORAL_TABLET | ORAL | Status: DC
  Administered 2017-04-20: 975 mg via ORAL
  Filled 2017-04-19: qty 3

## 2017-04-19 MED ORDER — NADOLOL 20 MG PO TABS
20.0000 mg | ORAL_TABLET | Freq: Every day | ORAL | Status: DC
  Administered 2017-04-19: 20 mg via ORAL
  Filled 2017-04-19 (×2): qty 1

## 2017-04-19 MED ORDER — LEVETIRACETAM 750 MG PO TABS
1500.0000 mg | ORAL_TABLET | Freq: Two times a day (BID) | ORAL | Status: DC
  Administered 2017-04-19 – 2017-04-20 (×2): 1500 mg via ORAL
  Filled 2017-04-19 (×3): qty 2

## 2017-04-19 MED ORDER — LOSARTAN POTASSIUM 25 MG PO TABS
25.0000 mg | ORAL_TABLET | Freq: Every day | ORAL | Status: DC
  Administered 2017-04-19 – 2017-04-20 (×2): 25 mg via ORAL
  Filled 2017-04-19 (×2): qty 1

## 2017-04-19 MED ORDER — FUROSEMIDE 20 MG PO TABS
20.0000 mg | ORAL_TABLET | Freq: Every day | ORAL | Status: DC
  Administered 2017-04-19 – 2017-04-20 (×2): 20 mg via ORAL
  Filled 2017-04-19 (×2): qty 1

## 2017-04-19 MED ORDER — FOLIC ACID 1 MG PO TABS
1.0000 mg | ORAL_TABLET | Freq: Every day | ORAL | Status: DC
  Administered 2017-04-20: 1 mg via ORAL
  Filled 2017-04-19: qty 1

## 2017-04-19 MED ORDER — ONDANSETRON HCL 4 MG PO TABS: 4.0000 mg | ORAL_TABLET | Freq: Four times a day (QID) | ORAL | Status: DC | PRN

## 2017-04-19 MED ORDER — SODIUM CHLORIDE 0.9% FLUSH
3.0000 mL | Freq: Two times a day (BID) | INTRAVENOUS | Status: DC
  Administered 2017-04-19 – 2017-04-20 (×2): 3 mL via INTRAVENOUS

## 2017-04-19 MED ORDER — OCTREOTIDE LOAD VIA INFUSION
50.0000 ug | Freq: Once | INTRAVENOUS | Status: AC
  Administered 2017-04-19: 50 ug via INTRAVENOUS
  Filled 2017-04-19: qty 25

## 2017-04-19 MED ORDER — SODIUM CHLORIDE 0.9 % IV SOLN
50.0000 ug/h | INTRAVENOUS | Status: DC
  Administered 2017-04-19: 50 ug/h via INTRAVENOUS
  Filled 2017-04-19 (×3): qty 1

## 2017-04-19 MED ORDER — PROMETHAZINE HCL 25 MG/ML IJ SOLN
12.5000 mg | Freq: Four times a day (QID) | INTRAMUSCULAR | Status: DC | PRN
  Administered 2017-04-19: 12.5 mg via INTRAVENOUS
  Filled 2017-04-19: qty 1

## 2017-04-19 MED ORDER — LURASIDONE HCL 40 MG PO TABS
80.0000 mg | ORAL_TABLET | Freq: Every day | ORAL | Status: DC
  Administered 2017-04-19: 80 mg via ORAL
  Filled 2017-04-19 (×2): qty 2

## 2017-04-19 NOTE — H&P (Signed)
Howard [name] is an 36 y.o. male.   Chief Complaint: Mild liver hurts and vomiting blood HPI: This is a 36 year old male with a history of alcoholic cirrhosis who is well known to the hospitalist service. He was just discharged a few days ago. He said today his liver is been hurting and he vomited blood clots. He's had no other symptoms. His ammonia level was found to be 118 but his mother who is with him says he has not been confused.  Past Medical History:  Diagnosis Date  . Alcoholic cirrhosis of liver with ascites ([place])   . Alcoholism ([place])   . Anemia   . Atrial fibrillation ([place])   . COPD (chronic obstructive pulmonary disease) ([place])   . Depression   . Diabetes mellitus, type II ([place])   . Esophageal varices ([place])   . Heart disease    irregular heart beat (palpitations) and heart murmur  . Hyperlipemia   . Hypertension   . Liver disease   . Multiple thyroid nodules   . Portal hypertensive gastropathy ([place])   . Schizophrenia ([place])   . Seizures ([place])     Past Surgical History:  Procedure Laterality Date  . ESOPHAGOGASTRODUODENOSCOPY N/A 10/05/2015   Procedure: ESOPHAGOGASTRODUODENOSCOPY (EGD);  Surgeon: Josefine Class, MD;  Location: [facility] ENDOSCOPY;  Service: Endoscopy;  Laterality: N/A;  . ESOPHAGOGASTRODUODENOSCOPY (EGD) WITH PROPOFOL N/A 08/03/2015   Procedure: ESOPHAGOGASTRODUODENOSCOPY (EGD) WITH PROPOFOL;  Surgeon: Josefine Class, MD;  Location: [facility] ENDOSCOPY;  Service: Endoscopy;  Laterality: N/A;  . ESOPHAGOGASTRODUODENOSCOPY (EGD) WITH PROPOFOL N/A 08/31/2015   Procedure: ESOPHAGOGASTRODUODENOSCOPY (EGD) WITH PROPOFOL;  Surgeon: Josefine Class, MD;  Location: [facility] ENDOSCOPY;  Service: Endoscopy;  Laterality: N/A;  . ESOPHAGOGASTRODUODENOSCOPY (EGD) WITH PROPOFOL N/A 04/04/2016   Procedure: ESOPHAGOGASTRODUODENOSCOPY (EGD) WITH PROPOFOL;  Surgeon: Manya Silvas, MD;  Location: [facility] ENDOSCOPY;  Service: Endoscopy;  Laterality: N/A;  .  ESOPHAGOGASTRODUODENOSCOPY (EGD) WITH PROPOFOL N/A 11/13/2016   Procedure: ESOPHAGOGASTRODUODENOSCOPY (EGD) WITH PROPOFOL;  Surgeon: Jonathon Bellows, MD;  Location: [facility] ENDOSCOPY;  Service: Endoscopy;  Laterality: N/A;  . ESOPHAGOGASTRODUODENOSCOPY (EGD) WITH PROPOFOL N/A 04/07/2017   Procedure: ESOPHAGOGASTRODUODENOSCOPY (EGD) WITH PROPOFOL;  Surgeon: Lucilla Lame, MD;  Location: [facility] ENDOSCOPY;  Service: Endoscopy;  Laterality: N/A;  . NO PAST SURGERIES      Family History  Problem Relation Age of Onset  . Heart disease Mother   . Hypertension Mother   . Hyperlipidemia Mother   . Stroke Father   . Heart attack Father   . Hypertension Father   . Heart disease Father   . Alcohol abuse Father   . Heart disease Brother    Social History:  reports that he has never smoked. He has never used smokeless tobacco. He reports that he drinks alcohol. He reports that he does not use drugs.  Allergies:  Allergies  Allergen Reactions  . Tramadol Itching     (Not in a hospital admission)  Results for orders placed or performed during the hospital encounter of 04/19/17 (from the past 48 hour(s))  Lipase, blood     Status: None   Collection Time: 04/19/17 12:11 PM  Result Value Ref Range   Lipase 41 11 - 51 U/L  Comprehensive metabolic panel     Status: Abnormal   Collection Time: 04/19/17 12:11 PM  Result Value Ref Range   Sodium 139 135 - 145 mmol/L   Potassium 4.2 3.5 - 5.1 mmol/L   Chloride 104 101 - 111 mmol/L   CO2 25  22 - 32 mmol/L   Glucose, Bld 166 (H) 65 - 99 mg/dL   BUN 27 (H) 6 - 20 mg/dL   Creatinine, Ser 1.69 (H) 0.61 - 1.24 mg/dL   Calcium 9.0 8.9 - 10.3 mg/dL   Total Protein 8.2 (H) 6.5 - 8.1 g/dL   Albumin 3.9 3.5 - 5.0 g/dL   AST 65 (H) 15 - 41 U/L   ALT 52 17 - 63 U/L   Alkaline Phosphatase 86 38 - 126 U/L   Total Bilirubin 0.8 0.3 - 1.2 mg/dL   GFR calc non Af Amer 50 (L) >60 mL/min   GFR calc Af Amer 58 (L) >60 mL/min    Comment: (NOTE) The eGFR has been  calculated using the CKD EPI equation. This calculation has not been validated in all clinical situations. eGFR's persistently <60 mL/min signify possible Chronic Kidney Disease.    Anion gap 10 5 - 15  CBC     Status: Abnormal   Collection Time: 04/19/17 12:11 PM  Result Value Ref Range   WBC 6.6 3.8 - 10.6 K/uL   RBC 3.99 (L) 4.40 - 5.90 MIL/uL   Hemoglobin 11.6 (L) 13.0 - 18.0 g/dL   HCT 35.1 (L) 40.0 - 52.0 %   MCV 88.1 80.0 - 100.0 fL   MCH 29.0 26.0 - 34.0 pg   MCHC 32.9 32.0 - 36.0 g/dL   RDW 18.7 (H) 11.5 - 14.5 %   Platelets 146 (L) 150 - 440 K/uL  Type and screen [facility]     Status: None   Collection Time: 04/19/17 12:11 PM  Result Value Ref Range   ABO/RH(D) O POS    Antibody Screen NEG    Sample Expiration 04/22/2017   Protime-INR     Status: Abnormal   Collection Time: 04/19/17 12:11 PM  Result Value Ref Range   Prothrombin Time 15.8 (H) 11.4 - 15.2 seconds   INR 1.25   Ammonia     Status: Abnormal   Collection Time: 04/19/17 12:15 PM  Result Value Ref Range   Ammonia 118 (H) 9 - 35 umol/L   Dg Chest 2 View  Result Date: 04/17/2017 CLINICAL DATA:  Chest pain earlier today. Shortness of breath for 1 year. Syncope. EXAM: CHEST  2 VIEW COMPARISON:  04/03/2017 FINDINGS: Linear scarring in the left lung base is unchanged since previous study. No airspace disease or consolidation in the lungs. Normal heart size and pulmonary vascularity. No blunting of costophrenic angles. No pneumothorax. IMPRESSION: Scarring in the left lung base. No evidence of active pulmonary disease. Electronically Signed   By: Lucienne Capers M.D.   On: 04/17/2017 21:44   Ct Head Wo Contrast  Result Date: 04/17/2017 CLINICAL DATA:  Syncope and fall. EXAM: CT HEAD WITHOUT CONTRAST TECHNIQUE: Contiguous axial images were obtained from the base of the skull through the vertex without intravenous contrast. COMPARISON:  CT scan of March 25, 2017. FINDINGS: Brain: No evidence of  acute infarction, hemorrhage, hydrocephalus, extra-axial collection or mass lesion/mass effect. Vascular: No hyperdense vessel or unexpected calcification. Skull: Normal. Negative for fracture or focal lesion. Sinuses/Orbits: No acute finding. Other: None. IMPRESSION: Normal head CT. Electronically Signed   By: Marijo Conception, M.D.   On: 04/17/2017 21:34    Review of Systems  Constitutional: Negative for chills and fever.  HENT: Negative for hearing loss.   Eyes: Negative for blurred vision.  Respiratory: Negative for shortness of breath.   Cardiovascular: Negative for chest pain.  Gastrointestinal: Positive for vomiting.  Genitourinary: Negative for dysuria.  Musculoskeletal: Negative for myalgias.  Skin: Negative for rash.  Neurological: Negative for dizziness.    Blood pressure 102/62, pulse (!) 57, resp. rate 19, height _0  (1.905 m), weight (!) 155.1 kg (342 lb), SpO2 95 %. Physical Exam  Constitutional: He is oriented to person, place, and time. He appears well-developed and well-nourished. No distress.  HENT:  Head: Normocephalic and atraumatic.  Mouth/Throat: No oropharyngeal exudate.  Eyes: Pupils are equal, round, and reactive to light. No scleral icterus.  Neck: Neck supple. No JVD present. No tracheal deviation present. No thyromegaly present.  Cardiovascular: Normal rate and regular rhythm.   No murmur heard. Respiratory: Breath sounds normal. No respiratory distress. He has no wheezes. He has no rales. He exhibits no tenderness.  GI:  Distended but not taut. No tenderness on exam.  Musculoskeletal: Normal range of motion. He exhibits no edema.  Neurological: He is alert and oriented to person, place, and time.  Skin: Skin is warm and dry.     Assessment/Plan 1. Hematemisis. This was not witnessed [place] verified by his mother who is present. He's been admitted several times with the same complaint. His hemoglobin is 11.8 which is been in the range is been the last  few admissions. We'll go ahead and start him on PPI and octreotide. Follow serial hemoglobins. 2. Esophageal varices. Last endoscopy showed grade 1 distal varices without bleeding. He's on that'll all. Again were starting some octreotide. We'll observe him overnight for any drop in hemoglobin or hematocrit emesis. If neither is confirmed then will stop the octreotide in the morning. Discussed with GI and even though no one is on for consult tomorrow there is someone doing procedures and if he had evidence of bleeding procedure could be performed if urgently necessary.  3. Alcoholic cirrhosis. He does have elevated ammonia level. He is on medications for this. Not sure about his compliance. We'll go ahead and continue his lactulose and his Xifaxan. Recheck ammonia level on the morning. 4. Seizure disorder. Continue Keppra 5. Multiple readmittance. We'll go ahead and consult palliative care for goals of care.  Total time spent 45 minutes  Baxter Hire, MD 04/19/2017, 2:07 PM

## 2017-04-19 NOTE — Progress Notes (Signed)
Pt c/o being nauseated and stated "I can't keep anything down even water". Pt received Zofran 4mg  IV at 1815. Dr. Anne HahnWillis paged and ordered for Phenergan 12.5-25mg  IV every 6 hours PRN. Will administer as ordered and continue to monitor.

## 2017-04-19 NOTE — ED Triage Notes (Signed)
Pt came to ED via EMS c/o vomiting up blood that started this morning. Pt has history of alcohol abuse, reports "hurting over liver." Reports last drink was 14 days ago. Vs stable.

## 2017-04-19 NOTE — ED Notes (Signed)
Pt reports normally on 2 L. Placed on [place]

## 2017-04-19 NOTE — ED Provider Notes (Signed)
[facility] Emergency Department Provider Note  ____________________________________________  Time seen: Approximately 1:24 PM  I have reviewed the triage vital signs and the nursing notes.   HISTORY  Chief Complaint Hematemesis   HPI Howard [name] is a 36 y.o. male with a history of alcoholic cirrhosis complicated by ascites and varicele bleedwho presents for evaluation of hematemesis. Patient had one episode of hematemesis. He is also complaining of LUQ sharp constant moderate abdominal pain since earlier this morning. Patient has had this pain several times in the past. He reports his last alcohol intake was 14 days ago. No fever or chills. Patient also had a seizure that was witnessed by his mother this morning. He endorses compliance with his medications. No melena.   Past Medical History:  Diagnosis Date  . Alcoholic cirrhosis of liver with ascites (HCC)   . Alcoholism (HCC)   . Anemia   . Atrial fibrillation (HCC)   . COPD (chronic obstructive pulmonary disease) (HCC)   . Depression   . Diabetes mellitus, type II (HCC)   . Esophageal varices (HCC)   . Heart disease    irregular heart beat (palpitations) and heart murmur  . Hyperlipemia   . Hypertension   . Liver disease   . Multiple thyroid nodules   . Portal hypertensive gastropathy (HCC)   . Schizophrenia (HCC)   . Seizures [facility](HCC)     Patient Active Problem List   Diagnosis Date Noted  . Esophageal varices without bleeding (HCC)   . Hematemesis 04/05/2017  . Upper GI bleed   . Iron deficiency anemia due to chronic blood loss 02/16/2017  . Nausea 01/18/2017  . Respiratory failure with hypoxia (HCC) 12/14/2016  . Seizures (HCC) 12/12/2016  . DNR (do not resuscitate) discussion 08/11/2016  . Muscle weakness (generalized)   . GI bleed 08/07/2016  . OSA (obstructive sleep apnea) 04/29/2016  . Anemia 04/29/2016  . Thrombocytopenia (HCC) 04/29/2016  . Coagulopathy (HCC) 04/29/2016  .  Obesity 04/29/2016  . Controlled type 2 diabetes mellitus without complication (HCC) 01/15/2016  . Essential (primary) hypertension 12/11/2015  . Pure hypercholesterolemia 12/11/2015  . Hepatic encephalopathy (HCC) 10/16/2015  . 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 Elevated transaminase level 07/04/2015  . Alcohol abuse 05/31/2015  . Paranoid schizophrenia (HCC) 03/20/2015    Past Surgical History:  Procedure Laterality Date  . ESOPHAGOGASTRODUODENOSCOPY N/A 10/05/2015   Procedure: ESOPHAGOGASTRODUODENOSCOPY (EGD);  Surgeon: Elnita MaxwellMatthew Gordon Rein, MD;  [facility];  Service: Endoscopy;  Laterality: N/A;  . ESOPHAGOGASTRODUODENOSCOPY (EGD) WITH PROPOFOL N/A 08/03/2015   Procedure: ESOPHAGOGASTRODUODENOSCOPY (EGD) WITH PROPOFOL;  Surgeon: Elnita MaxwellMatthew Gordon Rein, MD;  [facility];  Service: Endoscopy;  Laterality: N/A;  . ESOPHAGOGASTRODUODENOSCOPY (EGD) WITH PROPOFOL N/A 08/31/2015   Procedure: ESOPHAGOGASTRODUODENOSCOPY (EGD) WITH PROPOFOL;  Surgeon: Elnita MaxwellMatthew Gordon Rein, MD;  [facility];  Service: Endoscopy;  Laterality: N/A;  . ESOPHAGOGASTRODUODENOSCOPY (EGD) WITH PROPOFOL N/A 04/04/2016   Procedure: ESOPHAGOGASTRODUODENOSCOPY (EGD) WITH PROPOFOL;  Surgeon: Scot Junobert T Elliott, MD;  [facility];  Service: Endoscopy;  Laterality: N/A;  . ESOPHAGOGASTRODUODENOSCOPY (EGD) WITH PROPOFOL N/A 11/13/2016   Procedure: ESOPHAGOGASTRODUODENOSCOPY (EGD) WITH PROPOFOL;  Surgeon: Wyline MoodKiran Anna, MD;  Location: [facility] ENDOSCOPY;  Service: Endoscopy;  Laterality: N/A;  . ESOPHAGOGASTRODUODENOSCOPY (EGD) WITH PROPOFOL N/A 04/07/2017   Procedure: ESOPHAGOGASTRODUODENOSCOPY (EGD) WITH PROPOFOL;  Surgeon: Midge MiniumWohl, Darren, MD;  Location: [facility] ENDOSCOPY;  Service: Endoscopy;  Laterality: N/A;  . NO PAST SURGERIES      Prior to Admission  medications   Medication Sig Start Date End Date Taking? Authorizing Provider  acamprosate (CAMPRAL) 333 MG tablet  Take 2 tablets (666 mg total) by mouth 3 (three) times daily with meals. 03/24/17  Yes [name], Jackquline DenmarkJohn T, MD  albuterol (PROVENTIL HFA;VENTOLIN HFA) 108 (90 Base) MCG/ACT inhaler Inhale 2 puffs into the lungs every 6 (six) hours as needed for wheezing or shortness of breath. Patient taking differently: Inhale 2 puffs into the lungs every 4 (four) hours as needed for wheezing or shortness of breath.  10/08/16  Yes Shaune Pollackhen, Qing, MD  citalopram (CELEXA) 20 MG tablet Take 20 mg by mouth daily. 04/07/17  Yes [provider]  ferrous sulfate 325 (65 FE) MG tablet Take 3 tablets (975 mg total) by mouth every Monday, Wednesday, and Friday. With breakfast 01/21/17  Yes Galen ManilaKennedy, [name], NP  folic acid (FOLVITE) 1 MG tablet Take 1 tablet (1 mg total) by mouth daily. 04/16/17  Yes Wieting, Richard, MD  furosemide (LASIX) 20 MG tablet Take 20 mg by mouth daily.   Yes [provider]  lactulose (CHRONULAC) 10 GM/15ML solution Take 45 mLs (30 g total) by mouth 4 (four) times daily. 04/16/17  Yes Alford HighlandWieting, Richard, MD  levETIRAcetam (KEPPRA) 750 MG tablet Take 2 tablets (1,500 mg total) by mouth 2 (two) times daily. 12/11/16 04/05/18 Yes Merrily Brittleifenbark, Neil, MD  losartan (COZAAR) 25 MG tablet Take 1 tablet (25 mg total) by mouth daily. 03/11/17  Yes Galen ManilaKennedy, [name], NP  lurasidone (LATUDA) 80 MG TABS tablet Take 1 tablet (80 mg total) by mouth daily with supper. 03/24/17  Yes [name], Jackquline DenmarkJohn T, MD  metFORMIN (GLUCOPHAGE) 500 MG tablet TAKE 1 TABLET BY MOUTH 2 TIMES DAILY WITH MEALS 03/26/17  Yes Galen ManilaKennedy, [name], NP  nadolol (CORGARD) 20 MG tablet Take 1 tablet (20 mg total) by mouth daily. 03/11/17  Yes Galen ManilaKennedy, [name], NP  omeprazole (PRILOSEC) 40 MG capsule Take 1 capsule (40 mg total) by mouth 2 (two) times daily. 04/16/17  Yes Wieting, Richard, MD  rifaximin (XIFAXAN) 550 MG TABS tablet Take 1 tablet (550 mg total) by mouth 2 (two) times daily. 07/09/16  Yes Katharina CaperVaickute, Rima, MD  spironolactone  (ALDACTONE) 50 MG tablet Take 1 tablet (50 mg total) by mouth 2 (two) times daily. 03/02/17  Yes Galen ManilaKennedy, [name], NP  sucralfate (CARAFATE) 1 g tablet Take 1 tablet by mouth 4 (four) times daily.   Yes [provider]  thiamine (VITAMIN B-1) 100 MG tablet Take 100 mg by mouth daily.   Yes [provider]  traZODone (DESYREL) 100 MG tablet Take 1 tablet (100 mg total) by mouth at bedtime as needed for sleep. 03/24/17  Yes [name], Jackquline DenmarkJohn T, MD  feeding supplement, ENSURE ENLIVE, (ENSURE ENLIVE) LIQD Take 237 mLs by mouth 2 (two) times daily between meals. 04/16/17   Alford HighlandWieting, Richard, MD  NON FORMULARY     [provider]  Oral Medication Containers (ALARM CLOCK PILL BOX) MISC 1 each by Does not apply route daily. 04/09/17   Galen ManilaKennedy, [name], NP    Allergies Tramadol  Family History  Problem Relation Age of Onset  . Heart disease Mother   . Hypertension Mother   . Hyperlipidemia Mother   . Stroke Father   . Heart attack Father   . Hypertension Father   . Heart disease Father   . Alcohol abuse Father   . Heart disease Brother     Social History Social History  Substance Use Topics  .  Smoking status: Never Smoker  . Smokeless tobacco: Never Used  . Alcohol use 0.0 oz/week     Comment: last drink 14 days ago    Review of Systems  Constitutional: Negative for fever. Eyes: Negative for visual changes. ENT: Negative for sore throat. Neck: No neck pain  Cardiovascular: Negative for chest pain. Respiratory: Negative for shortness of breath. Gastrointestinal: + LUQ abdominal pain, hematemesis, No vomiting or diarrhea. Genitourinary: Negative for dysuria. Musculoskeletal: Negative for back pain. Skin: Negative for rash. Neurological: Negative for headaches, weakness or numbness. + seizure Psych: No SI or HI  ____________________________________________   PHYSICAL EXAM:  VITAL SIGNS: ED Triage Vitals  Enc Vitals Group     BP 04/19/17 1212  128/74     Pulse Rate 04/19/17 1212 66     Resp 04/19/17 1212 (!) 22     Temp --      Temp src --      SpO2 04/19/17 1212 93 %     Weight 04/19/17 1209 (!) 342 lb (155.1 kg)     Height 04/19/17 1209 6\' 3"  (1.905 m)     Head Circumference --      Peak Flow --      Pain Score --      Pain Loc --      Pain Edu? --      Excl. in GC? --     Constitutional: Alert and oriented. Well appearing and in no apparent distress. HEENT:      Head: Normocephalic and atraumatic.         Eyes: Conjunctivae are normal. Sclera is non-icteric.       Mouth/Throat: Mucous membranes are moist.       Neck: Supple with no signs of meningismus. Cardiovascular: Regular rate and rhythm. No murmurs, gallops, or rubs. 2+ symmetrical distal pulses are present in all extremities. No JVD. Respiratory: Normal respiratory effort. Lungs are clear to auscultation bilaterally. No wheezes, crackles, or rhonchi.  Gastrointestinal: Soft, ttp over RUQ, and non distended with positive bowel sounds. No rebound or guarding. Musculoskeletal: Nontender with normal range of motion in all extremities. No edema, cyanosis, or erythema of extremities. Neurologic: Normal speech and language. Face is symmetric. Moving all extremities. No gross focal neurologic deficits are appreciated. Skin: Skin is warm, dry and intact. No rash noted. Psychiatric: Mood and affect are normal. Speech and behavior are normal.  ____________________________________________   LABS (all labs ordered are listed, but only abnormal results are displayed)  Labs Reviewed  COMPREHENSIVE METABOLIC PANEL - Abnormal; Notable for the following:       Result Value   Glucose, Bld 166 (*)    BUN 27 (*)    Creatinine, Ser 1.69 (*)    Total Protein 8.2 (*)    AST 65 (*)    GFR calc non Af Amer 50 (*)    GFR calc Af Amer 58 (*)    All other components within normal limits  CBC - Abnormal; Notable for the following:    RBC 3.99 (*)    Hemoglobin 11.6 (*)    HCT  35.1 (*)    RDW 18.7 (*)    Platelets 146 (*)    All other components within normal limits  AMMONIA - Abnormal; Notable for the following:    Ammonia 118 (*)    All other components within normal limits  PROTIME-INR - Abnormal; Notable for the following:    Prothrombin Time 15.8 (*)    All other components  within normal limits  HEMOGLOBIN - Abnormal; Notable for the following:    Hemoglobin 12.2 (*)    All other components within normal limits  COMPREHENSIVE METABOLIC PANEL - Abnormal; Notable for the following:    Glucose, Bld 100 (*)    BUN 22 (*)    Creatinine, Ser 1.60 (*)    Calcium 8.8 (*)    AST 62 (*)    GFR calc non Af Amer 53 (*)    All other components within normal limits  AMMONIA - Abnormal; Notable for the following:    Ammonia 80 (*)    All other components within normal limits  GLUCOSE, CAPILLARY - Abnormal; Notable for the following:    Glucose-Capillary 119 (*)    All other components within normal limits  GLUCOSE, CAPILLARY - Abnormal; Notable for the following:    Glucose-Capillary 102 (*)    All other components within normal limits  CBC - Abnormal; Notable for the following:    RBC 4.28 (*)    Hemoglobin 12.1 (*)    HCT 37.8 (*)    RDW 18.5 (*)    Platelets 127 (*)    All other components within normal limits  GLUCOSE, CAPILLARY - Abnormal; Notable for the following:    Glucose-Capillary 140 (*)    All other components within normal limits  LIPASE, BLOOD  GLUCOSE, CAPILLARY  TYPE AND SCREEN   ____________________________________________  EKG  ED ECG REPORT I, Nita Sickle, the attending physician, personally viewed and interpreted this ECG.  Normal sinus rhythm, rate of 67, normal intervals, normal axis, no ST elevations or depressions. ____________________________________________  RADIOLOGY  none  ____________________________________________   PROCEDURES  Procedure(s) performed: None Procedures Critical Care performed:   None ____________________________________________   INITIAL IMPRESSION / ASSESSMENT AND PLAN / ED COURSE  36 y.o. male with a history of alcoholic cirrhosis complicated by ascites and varicele bleedwho presents for evaluation of hematemesis. Patient hemodynamically stable, was started on octreotide and Protonix. Type and screen active. 2 large bore IVs were placed. Blood work showing elevated ammonia of 118, patient was given lactulose. Worsening kidney injury with creatinine of 1.69 (baseline is 0.9). Patient was given IV fluids. Patient also had a 2-point hemoglobin drop when compared to labs from 2 days ago. No indication for transfusion. Patient was then admitted to the hospitalist service for further evaluation.     Pertinent labs & imaging results that were available during my care of the patient were reviewed by me and considered in my medical decision making (see chart for details).    ____________________________________________   FINAL CLINICAL IMPRESSION(S) / ED DIAGNOSES  Final diagnoses:  Hematemesis with nausea  Bleeding esophageal varices, unspecified esophageal varices type (HCC)  Hepatic encephalopathy (HCC)  AKI (acute kidney injury) (HCC)      NEW MEDICATIONS STARTED DURING THIS VISIT:  Discharge Medication List as of 04/20/2017 10:28 AM       Note:  This document was prepared using Dragon voice recognition software and may include unintentional dictation errors.    Nita Sickle, MD 04/20/17 2056

## 2017-04-19 NOTE — ED Notes (Signed)
Called pharmacy for meds

## 2017-04-19 NOTE — Progress Notes (Signed)
Patient had 1 episode of vomiting , food with some blood from nose bleed.

## 2017-04-20 ENCOUNTER — Encounter: Payer: Self-pay | Admitting: Psychiatry

## 2017-04-20 ENCOUNTER — Ambulatory Visit (INDEPENDENT_AMBULATORY_CARE_PROVIDER_SITE_OTHER): Payer: Medicare Other | Admitting: Psychiatry

## 2017-04-20 VITALS — BP 130/78 | HR 66 | Temp 97.5°F | Wt 337.4 lb

## 2017-04-20 DIAGNOSIS — F200 Paranoid schizophrenia: Secondary | ICD-10-CM

## 2017-04-20 DIAGNOSIS — K7290 Hepatic failure, unspecified without coma: Secondary | ICD-10-CM | POA: Diagnosis not present

## 2017-04-20 DIAGNOSIS — F1010 Alcohol abuse, uncomplicated: Secondary | ICD-10-CM

## 2017-04-20 LAB — CBC
HEMATOCRIT: 37.8 % — AB (ref 40.0–52.0)
HEMOGLOBIN: 12.1 g/dL — AB (ref 13.0–18.0)
MCH: 28.4 pg (ref 26.0–34.0)
MCHC: 32.1 g/dL (ref 32.0–36.0)
MCV: 88.5 fL (ref 80.0–100.0)
Platelets: 127 10*3/uL — ABNORMAL LOW (ref 150–440)
RBC: 4.28 MIL/uL — AB (ref 4.40–5.90)
RDW: 18.5 % — ABNORMAL HIGH (ref 11.5–14.5)
WBC: 6.9 10*3/uL (ref 3.8–10.6)

## 2017-04-20 LAB — COMPREHENSIVE METABOLIC PANEL
ALBUMIN: 3.8 g/dL (ref 3.5–5.0)
ALT: 51 U/L (ref 17–63)
AST: 62 U/L — AB (ref 15–41)
Alkaline Phosphatase: 87 U/L (ref 38–126)
Anion gap: 6 (ref 5–15)
BILIRUBIN TOTAL: 0.8 mg/dL (ref 0.3–1.2)
BUN: 22 mg/dL — AB (ref 6–20)
CHLORIDE: 106 mmol/L (ref 101–111)
CO2: 29 mmol/L (ref 22–32)
Calcium: 8.8 mg/dL — ABNORMAL LOW (ref 8.9–10.3)
Creatinine, Ser: 1.6 mg/dL — ABNORMAL HIGH (ref 0.61–1.24)
GFR calc Af Amer: >60 mL/min (ref >60)
GFR calc non Af Amer: 53 mL/min — ABNORMAL LOW (ref >60)
GLUCOSE: 100 mg/dL — AB (ref 65–99)
POTASSIUM: 4.1 mmol/L (ref 3.5–5.1)
Sodium: 141 mmol/L (ref 135–145)
TOTAL PROTEIN: 8 g/dL (ref 6.5–8.1)

## 2017-04-20 LAB — GLUCOSE, CAPILLARY
Glucose-Capillary: 102 mg/dL — ABNORMAL HIGH (ref 65–99)
Glucose-Capillary: 140 mg/dL — ABNORMAL HIGH (ref 65–99)

## 2017-04-20 LAB — AMMONIA: Ammonia: 80 umol/L — ABNORMAL HIGH (ref 9–35)

## 2017-04-20 NOTE — Care Management Note (Signed)
Case Management Note  Patient Details  Name: Howard [name] MRN: [id number] Date of Birth: Jul 26, 1981  Subjective/Objective:  [facility] Hematemesis. Readmitted for Hematemesis on 07/29 as an observation patient. Lives at home with his mother. PCP Dr. Kyung RuddKennedy. Has chronic O2 through [employer]. Uses a rolling walker and cane. Followed by [facility] for nursing only.  Home Health. [facility] notified of admission.Palliative care has been consulted for goals of care.       Action/Plan: Will follow progression   Expected Discharge Date:                  Expected Discharge Plan:  Home w Home Health Services  In-House Referral:     Discharge planning Services  CM Consult  Post Acute Care Choice:  Resumption of Svcs/PTA Provider Choice offered to:     DME Arranged:    DME Agency:     HH Arranged:    HH Agency:  [facility]  Status of Service:  In process, will continue to follow  If discussed at Long Length of Stay Meetings, dates discussed:    Additional Comments:  Marily MemosLisa M Elizabelle Fite, RN 04/20/2017, 8:35 AM

## 2017-04-20 NOTE — Care Management Obs Status (Signed)
MEDICARE OBSERVATION STATUS NOTIFICATION   Patient Details  Name: Howard [name] MRN: [id number] Date of Birth: [birth year]   Medicare Observation Status Notification Given:  Yes    Howard MemosLisa M Kebra Lowrimore, RN 04/20/2017, 8:32 AM

## 2017-04-20 NOTE — Discharge Summary (Signed)
[employer] [facility] [place] [facility]   PATIENT NAME: Howard [name]    MR#:  [id number]  DATE OF BIRTH:  [birth year]  DATE OF ADMISSION:  04/19/2017 ADMITTING PHYSICIAN: Gracelyn Nurse, MD  DATE OF DISCHARGE: 04/20/2017  PRIMARY CARE PHYSICIAN: [name], NP    ADMISSION DIAGNOSIS:  Hepatic encephalopathy (HCC) [K72.90] AKI (acute kidney injury) (HCC) [N17.9] Hematemesis with nausea [K92.0] Bleeding esophageal varices, unspecified esophageal varices type (HCC) [I85.01]  DISCHARGE DIAGNOSIS:  Active Problems:   Hematemesis   SECONDARY DIAGNOSIS:   Past Medical History:  Diagnosis Date  . Alcoholic cirrhosis of liver with ascites (HCC)   . Alcoholism (HCC)   . Anemia   . Atrial fibrillation (HCC)   . COPD (chronic obstructive pulmonary disease) (HCC)   . Depression   . Diabetes mellitus, type II (HCC)   . Esophageal varices (HCC)   . Heart disease    irregular heart beat (palpitations) and heart murmur  . Hyperlipemia   . Hypertension   . Liver disease   . Multiple thyroid nodules   . Portal hypertensive gastropathy (HCC)   . Schizophrenia (HCC)   . Seizures [facility])     HOSPITAL COURSE:  36 year old male with history of liver cirrhosis and esophageal varices who presented with apparent hematemesis after having episode of nausea.  1. Hematemesis: Patient has been hospitalized for this complaint several times in the past. His hemoglobin is completely stable. He tolerated his diet. He had no evidence of dark-colored stools, hematochezia or hematemesis during this hospital stay.  2. History of EtOH related cirrhosis and esophageal varices with last endoscopy showing grade 1 distal varices without bleeding. He will continue Carafate and PPI He will continue other outpatient indications for liver cirrhosis including lactulose and Xifaxan as well as nadolol  3. Seizure disorder: Continue Keppra  4. Diabetes: Continue outpatient regimen with ADA  diet   DISCHARGE CONDITIONS AND DIET:   stable Diabetic diet  CONSULTS OBTAINED:    DRUG ALLERGIES:   Allergies  Allergen Reactions  . Tramadol Itching    DISCHARGE MEDICATIONS:   Current Discharge Medication List    CONTINUE these medications which have NOT CHANGED   Details  acamprosate (CAMPRAL) 333 MG tablet Take 2 tablets (666 mg total) by mouth 3 (three) times daily with meals. Qty: 180 tablet, Refills: 5    albuterol (PROVENTIL HFA;VENTOLIN HFA) 108 (90 Base) MCG/ACT inhaler Inhale 2 puffs into the lungs every 6 (six) hours as needed for wheezing or shortness of breath. Qty: 1 Inhaler, Refills: 2    citalopram (CELEXA) 20 MG tablet Take 20 mg by mouth daily.    ferrous sulfate 325 (65 FE) MG tablet Take 3 tablets (975 mg total) by mouth every Monday, Wednesday, and Friday. With breakfast Qty: 36 tablet, Refills: 5    folic acid (FOLVITE) 1 MG tablet Take 1 tablet (1 mg total) by mouth daily. Qty: 30 tablet, Refills: 0    furosemide (LASIX) 20 MG tablet Take 20 mg by mouth daily.    lactulose (CHRONULAC) 10 GM/15ML solution Take 45 mLs (30 g total) by mouth 4 (four) times daily. Qty: 5676 mL, Refills: 0    levETIRAcetam (KEPPRA) 750 MG tablet Take 2 tablets (1,500 mg total) by mouth 2 (two) times daily. Qty: 120 tablet, Refills: 0    losartan (COZAAR) 25 MG tablet Take 1 tablet (25 mg total) by mouth daily. Qty: 30 tablet, Refills: 2    lurasidone (LATUDA) 80 MG TABS  tablet Take 1 tablet (80 mg total) by mouth daily with supper. Qty: 30 tablet, Refills: 5    metFORMIN (GLUCOPHAGE) 500 MG tablet TAKE 1 TABLET BY MOUTH 2 TIMES DAILY WITH MEALS Qty: 60 tablet, Refills: 3    nadolol (CORGARD) 20 MG tablet Take 1 tablet (20 mg total) by mouth daily. Qty: 30 tablet, Refills: 2    omeprazole (PRILOSEC) 40 MG capsule Take 1 capsule (40 mg total) by mouth 2 (two) times daily. Qty: 60 capsule, Refills: 0    rifaximin (XIFAXAN) 550 MG TABS tablet Take 1 tablet  (550 mg total) by mouth 2 (two) times daily. Qty: 60 tablet, Refills: 5    spironolactone (ALDACTONE) 50 MG tablet Take 1 tablet (50 mg total) by mouth 2 (two) times daily. Qty: 60 tablet, Refills: 5    sucralfate (CARAFATE) 1 g tablet Take 1 tablet by mouth 4 (four) times daily.    thiamine (VITAMIN B-1) 100 MG tablet Take 100 mg by mouth daily.    traZODone (DESYREL) 100 MG tablet Take 1 tablet (100 mg total) by mouth at bedtime as needed for sleep. Qty: 30 tablet, Refills: 5    feeding supplement, ENSURE ENLIVE, (ENSURE ENLIVE) LIQD Take 237 mLs by mouth 2 (two) times daily between meals. Qty: 60 Bottle, Refills: 0    NON FORMULARY     Oral Medication Containers (ALARM CLOCK PILL BOX) MISC 1 each by Does not apply route daily. Qty: 1 each, Refills: 0   Associated Diagnoses: Nonadherence to medication      STOP taking these medications     clotrimazole (LOTRIMIN) 1 % external solution           Today   CHIEF COMPLAINT:   No hematemesis reported. It is breakfast consisting of an omelette.   VITAL SIGNS:  Blood pressure 135/80, pulse (!) 58, temperature 97.9 F (36.6 C), temperature source Oral, resp. rate 16, height 6\' 3"  (1.905 m), weight (!) 155.1 kg (342 lb), SpO2 93 %.   REVIEW OF SYSTEMS:  Review of Systems  Constitutional: Negative.  Negative for chills, fever and malaise/fatigue.  HENT: Negative.  Negative for ear discharge, ear pain, hearing loss, nosebleeds and sore throat.   Eyes: Negative.  Negative for blurred vision and pain.  Respiratory: Negative.  Negative for cough, hemoptysis, shortness of breath and wheezing.   Cardiovascular: Negative.  Negative for chest pain, palpitations and leg swelling.  Gastrointestinal: Negative.  Negative for abdominal pain, blood in stool, diarrhea, nausea and vomiting.  Genitourinary: Negative.  Negative for dysuria.  Musculoskeletal: Negative.  Negative for back pain.  Skin: Negative.   Neurological: Negative for  dizziness, tremors, speech change, focal weakness, seizures and headaches.  Endo/Heme/Allergies: Negative.  Does not bruise/bleed easily.  Psychiatric/Behavioral: Negative.  Negative for depression, hallucinations and suicidal ideas.     PHYSICAL EXAMINATION:  GENERAL:  36 y.o.-year-old patient lying in the bed with no acute distress.  NECK:  Supple, no jugular venous distention. No thyroid enlargement, no tenderness.  LUNGS: Normal breath sounds bilaterally, no wheezing, rales,rhonchi  No use of accessory muscles of respiration.  CARDIOVASCULAR: S1, S2 normal. No murmurs, rubs, or gallops.  ABDOMEN: Soft, non-tender, ++Distended. Bowel sounds present. No organomegaly or mass.  EXTREMITIES: No pedal edema, cyanosis, or clubbing.  PSYCHIATRIC: The patient is alert and oriented x 3.  SKIN: No obvious rash, lesion, or ulcer.   DATA REVIEW:   CBC  Recent Labs Lab 04/20/17 0853  WBC 6.9  HGB 12.1*  HCT 37.8*  PLT 127*    Chemistries   Recent Labs Lab 04/20/17 0324  NA 141  K 4.1  CL 106  CO2 29  GLUCOSE 100*  BUN 22*  CREATININE 1.60*  CALCIUM 8.8*  AST 62*  ALT 51  ALKPHOS 87  BILITOT 0.8    Cardiac Enzymes  Recent Labs Lab 04/17/17 1804  TROPONINI <0.03    Microbiology Results  @MICRORSLT48 @  RADIOLOGY:  No results found.    Current Discharge Medication List    CONTINUE these medications which have NOT CHANGED   Details  acamprosate (CAMPRAL) 333 MG tablet Take 2 tablets (666 mg total) by mouth 3 (three) times daily with meals. Qty: 180 tablet, Refills: 5    albuterol (PROVENTIL HFA;VENTOLIN HFA) 108 (90 Base) MCG/ACT inhaler Inhale 2 puffs into the lungs every 6 (six) hours as needed for wheezing or shortness of breath. Qty: 1 Inhaler, Refills: 2    citalopram (CELEXA) 20 MG tablet Take 20 mg by mouth daily.    ferrous sulfate 325 (65 FE) MG tablet Take 3 tablets (975 mg total) by mouth every Monday, Wednesday, and Friday. With breakfast Qty:  36 tablet, Refills: 5    folic acid (FOLVITE) 1 MG tablet Take 1 tablet (1 mg total) by mouth daily. Qty: 30 tablet, Refills: 0    furosemide (LASIX) 20 MG tablet Take 20 mg by mouth daily.    lactulose (CHRONULAC) 10 GM/15ML solution Take 45 mLs (30 g total) by mouth 4 (four) times daily. Qty: 5676 mL, Refills: 0    levETIRAcetam (KEPPRA) 750 MG tablet Take 2 tablets (1,500 mg total) by mouth 2 (two) times daily. Qty: 120 tablet, Refills: 0    losartan (COZAAR) 25 MG tablet Take 1 tablet (25 mg total) by mouth daily. Qty: 30 tablet, Refills: 2    lurasidone (LATUDA) 80 MG TABS tablet Take 1 tablet (80 mg total) by mouth daily with supper. Qty: 30 tablet, Refills: 5    metFORMIN (GLUCOPHAGE) 500 MG tablet TAKE 1 TABLET BY MOUTH 2 TIMES DAILY WITH MEALS Qty: 60 tablet, Refills: 3    nadolol (CORGARD) 20 MG tablet Take 1 tablet (20 mg total) by mouth daily. Qty: 30 tablet, Refills: 2    omeprazole (PRILOSEC) 40 MG capsule Take 1 capsule (40 mg total) by mouth 2 (two) times daily. Qty: 60 capsule, Refills: 0    rifaximin (XIFAXAN) 550 MG TABS tablet Take 1 tablet (550 mg total) by mouth 2 (two) times daily. Qty: 60 tablet, Refills: 5    spironolactone (ALDACTONE) 50 MG tablet Take 1 tablet (50 mg total) by mouth 2 (two) times daily. Qty: 60 tablet, Refills: 5    sucralfate (CARAFATE) 1 g tablet Take 1 tablet by mouth 4 (four) times daily.    thiamine (VITAMIN B-1) 100 MG tablet Take 100 mg by mouth daily.    traZODone (DESYREL) 100 MG tablet Take 1 tablet (100 mg total) by mouth at bedtime as needed for sleep. Qty: 30 tablet, Refills: 5    feeding supplement, ENSURE ENLIVE, (ENSURE ENLIVE) LIQD Take 237 mLs by mouth 2 (two) times daily between meals. Qty: 60 Bottle, Refills: 0    NON FORMULARY     Oral Medication Containers (ALARM CLOCK PILL BOX) MISC 1 each by Does not apply route daily. Qty: 1 each, Refills: 0   Associated Diagnoses: Nonadherence to medication       STOP taking these medications     clotrimazole (LOTRIMIN) 1 % external  solution            Management plans discussed with the patient and he is in agreement. Stable for discharge   Patient should follow up with pcp  CODE STATUS:     Code Status Orders        Start     Ordered   04/19/17 1609  Full code  Continuous     04/19/17 1608    Code Status History    Date Active Date Inactive Code Status Order ID Comments User Context   04/14/2017  7:47 PM 04/16/2017  5:38 PM Full Code [id number]  Alford HighlandWieting, Richard, MD ED   04/06/2017  2:17 AM 04/07/2017  4:14 PM Full Code [id number]  Oralia ManisWillis, David, MD Inpatient   03/26/2017  8:25 AM 03/27/2017  5:25 PM Full Code [id number]  Ihor AustinPyreddy, Pavan, MD Inpatient   01/18/2017  4:35 PM 01/19/2017  3:16 PM Full Code [id number]  Marguarite ArbourSparks, Jeffrey D, MD Inpatient   01/05/2017  2:57 PM 01/05/2017  9:46 PM Full Code [id number]  Arnaldo NatalMalinda, Paul F, MD ED   12/30/2016  2:51 AM 01/01/2017  2:33 PM Full Code [id number]  Oralia ManisWillis, David, MD Inpatient   12/18/2016 12:25 AM 12/19/2016  9:28 PM Full Code [id number]  Katharina CaperVaickute, Rima, MD Inpatient   12/12/2016 11:40 PM 12/15/2016  8:28 PM Full Code [id number]  Arnaldo Nataliamond, Michael S, MD Inpatient   11/24/2016  1:11 AM 11/25/2016  4:42 PM Full Code [id number]  Hugelmeyer, Jon GillsAlexis, DO Inpatient   11/12/2016  3:36 PM 11/14/2016  2:36 PM Full Code [id number]  Auburn BilberryPatel, Shreyang, MD Inpatient   10/04/2016  6:04 PM 10/08/2016  3:44 PM Full Code [id number]  Shaune Pollackhen, Qing, MD Inpatient   09/22/2016  2:29 AM 09/23/2016  6:38 PM Full Code [id number]  Oralia ManisWillis, David, MD Inpatient   08/07/2016  7:37 PM 08/12/2016  8:51 PM Full Code [id number]  Gracelyn NurseJohnston, John D, MD Inpatient   07/07/2016  1:40 AM 07/09/2016  6:29 PM Full Code [id number]  Ihor AustinPyreddy, Pavan, MD Inpatient   06/10/2016  6:21 AM 06/11/2016  8:25 PM Full Code [id number]  Ihor AustinPyreddy, Pavan, MD Inpatient   05/12/2016  3:55 AM 05/15/2016  2:57 PM Full Code [id number]  Ihor AustinPyreddy, Pavan, MD Inpatient   04/28/2016  2:54 AM 04/29/2016  6:23 PM  Full Code [id number]  Hugelmeyer, Alexis, DO Inpatient   03/14/2016 12:30 PM 03/16/2016  3:41 PM Full Code [id number]  Shaune Pollackhen, Qing, MD Inpatient   03/03/2016 12:13 PM 03/06/2016  2:26 PM Full Code [id number]  Milagros LollSudini, Srikar, MD ED   02/10/2016  5:54 PM 02/12/2016  7:34 PM Full Code [id number]  Ramonita LabGouru, Aruna, MD Inpatient   11/11/2015  5:16 AM 11/13/2015  6:00 PM Full Code [id number]  Ihor AustinPyreddy, Pavan, MD Inpatient   11/07/2015 10:02 PM 11/09/2015  4:56 PM Full Code [id number]  Enid BaasKalisetti, Radhika, MD Inpatient   10/28/2015  8:14 PM 10/29/2015  2:26 PM Full Code [id number]  Altamese DillingVachhani, Vaibhavkumar, MD Inpatient   10/16/2015 11:47 PM 10/17/2015  6:00 PM Full Code [id number]  Oralia ManisWillis, David, MD Inpatient   07/30/2015  5:35 PM 08/04/2015  8:34 PM Full Code [id number]  Enedina FinnerPatel, Sona, MD Inpatient   07/05/2015  1:16 AM 07/08/2015  7:22 PM Full Code [id number]  Oralia ManisWillis, David, MD Inpatient   05/30/2015  7:28 PM 06/01/2015  3:36 PM Full Code [id number]  [name], Jackquline DenmarkJohn T, MD Inpatient      TOTAL TIME TAKING CARE OF THIS PATIENT: 37 minutes.    Note: This dictation was prepared  with Dragon dictation along with smaller phrase technology. Any transcriptional errors that result from this process are unintentional.  Meridee Branum M.D on 04/20/2017 at 9:36 AM  Between 7am to 6pm - Pager - [phone] After 6pm go to [email or web address] - Social research officer, governmentpassword [email or web address] [email or web address]  [facility] [place] [facility]  Office  [phone]  CC: Primary care physician; Galen ManilaKennedy, [name], NP

## 2017-04-20 NOTE — Progress Notes (Signed)
Follow-up for 36 year old man with schizophrenia and alcohol abuse. He just got out of the hospital this morning after another admission with concerns about GI bleeding. Fortunately it looks like he is medically stable and not bleeding. He tells me he has now been 14 days without any alcohol. Psychotic symptoms are under better control. Hardly has any hallucinations at all. Mood still anxious and a little down but no suicidal ideation.  Neatly groomed gentleman looks his stated age. Cooperative and interactive. Slow as usual. Slightly blunted affect. No suicidal or homicidal thoughts. Minimal hallucinations and no sign of disordered thinking. Good insight and judgment.  Supportive counseling and as usual lots of encouragement and support around his ability to stay sober. No change to medicine. He feels like it is beneficial and stabilizing for him to come and speak with me and we will make an appointment to see him back again in another 2 weeks.

## 2017-04-20 NOTE — Care Management Note (Signed)
Case Management Note  Patient Details  Name: Andee LinemanJoey A Mckowen MRN: [id number] Date of Birth: 10/23/1980  Subjective/Objective:   Discharging today                 Action/Plan: [facility] made aware of discharge. Palliative care consult cancelled.   Expected Discharge Date:  04/20/17               Expected Discharge Plan:  Home w Home Health Services  In-House Referral:     Discharge planning Services  CM Consult  Post Acute Care Choice:  Resumption of Svcs/PTA Provider Choice offered to:     DME Arranged:    DME Agency:     HH Arranged:  RN HH Agency:  [facility]  Status of Service:  Completed, signed off  If discussed at Long Length of Stay Meetings, dates discussed:    Additional Comments:  Marily MemosLisa M Thaxton Pelley, RN 04/20/2017, 9:39 AM

## 2017-04-21 ENCOUNTER — Inpatient Hospital Stay: Payer: Self-pay | Admitting: Nurse Practitioner

## 2017-04-21 ENCOUNTER — Emergency Department: Payer: Medicare Other

## 2017-04-21 ENCOUNTER — Encounter: Payer: Self-pay | Admitting: Emergency Medicine

## 2017-04-21 ENCOUNTER — Emergency Department
Admission: EM | Admit: 2017-04-21 | Discharge: 2017-04-22 | Disposition: A | Discharge status: Home / Self Care | Payer: Medicare Other | Attending: Emergency Medicine | Admitting: Emergency Medicine

## 2017-04-21 ENCOUNTER — Emergency Department
Admission: EM | Admit: 2017-04-21 | Discharge: 2017-04-21 | Disposition: A | Discharge status: Home / Self Care | Payer: Medicare Other | Attending: Emergency Medicine | Admitting: Emergency Medicine

## 2017-04-21 DIAGNOSIS — K2921 Alcoholic gastritis with bleeding: Secondary | ICD-10-CM | POA: Insufficient documentation

## 2017-04-21 DIAGNOSIS — R7989 Other specified abnormal findings of blood chemistry: Secondary | ICD-10-CM

## 2017-04-21 DIAGNOSIS — R569 Unspecified convulsions: Secondary | ICD-10-CM | POA: Insufficient documentation

## 2017-04-21 DIAGNOSIS — Z7984 Long term (current) use of oral hypoglycemic drugs: Secondary | ICD-10-CM | POA: Insufficient documentation

## 2017-04-21 DIAGNOSIS — F1010 Alcohol abuse, uncomplicated: Secondary | ICD-10-CM

## 2017-04-21 DIAGNOSIS — I10 Essential (primary) hypertension: Secondary | ICD-10-CM | POA: Diagnosis not present

## 2017-04-21 DIAGNOSIS — R1110 Vomiting, unspecified: Secondary | ICD-10-CM | POA: Diagnosis not present

## 2017-04-21 DIAGNOSIS — F209 Schizophrenia, unspecified: Secondary | ICD-10-CM | POA: Diagnosis not present

## 2017-04-21 DIAGNOSIS — R531 Weakness: Secondary | ICD-10-CM | POA: Diagnosis present

## 2017-04-21 DIAGNOSIS — Z9114 Patient's other noncompliance with medication regimen: Secondary | ICD-10-CM | POA: Diagnosis not present

## 2017-04-21 DIAGNOSIS — W010XXA Fall on same level from slipping, tripping and stumbling without subsequent striking against object, initial encounter: Secondary | ICD-10-CM | POA: Insufficient documentation

## 2017-04-21 DIAGNOSIS — W19XXXA Unspecified fall, initial encounter: Secondary | ICD-10-CM

## 2017-04-21 DIAGNOSIS — E7220 Disorder of urea cycle metabolism, unspecified: Secondary | ICD-10-CM | POA: Insufficient documentation

## 2017-04-21 DIAGNOSIS — I4891 Unspecified atrial fibrillation: Secondary | ICD-10-CM | POA: Insufficient documentation

## 2017-04-21 DIAGNOSIS — Z79899 Other long term (current) drug therapy: Secondary | ICD-10-CM | POA: Diagnosis not present

## 2017-04-21 DIAGNOSIS — R1084 Generalized abdominal pain: Secondary | ICD-10-CM | POA: Diagnosis not present

## 2017-04-21 DIAGNOSIS — J449 Chronic obstructive pulmonary disease, unspecified: Secondary | ICD-10-CM | POA: Diagnosis not present

## 2017-04-21 DIAGNOSIS — E119 Type 2 diabetes mellitus without complications: Secondary | ICD-10-CM | POA: Diagnosis not present

## 2017-04-21 DIAGNOSIS — R51 Headache: Secondary | ICD-10-CM | POA: Diagnosis present

## 2017-04-21 LAB — CBC
HCT: 38.4 % — ABNORMAL LOW (ref 40.0–52.0)
HEMATOCRIT: 37 % — AB (ref 40.0–52.0)
HEMOGLOBIN: 12.1 g/dL — AB (ref 13.0–18.0)
HEMOGLOBIN: 12.6 g/dL — AB (ref 13.0–18.0)
MCH: 28.6 pg (ref 26.0–34.0)
MCH: 29.3 pg (ref 26.0–34.0)
MCHC: 32.7 g/dL (ref 32.0–36.0)
MCHC: 32.9 g/dL (ref 32.0–36.0)
MCV: 87.5 fL (ref 80.0–100.0)
MCV: 89 fL (ref 80.0–100.0)
PLATELETS: 138 10*3/uL — AB (ref 150–440)
Platelets: 138 10*3/uL — ABNORMAL LOW (ref 150–440)
RBC: 4.23 MIL/uL — ABNORMAL LOW (ref 4.40–5.90)
RBC: 4.31 MIL/uL — AB (ref 4.40–5.90)
RDW: 18.5 % — AB (ref 11.5–14.5)
RDW: 18 % — ABNORMAL HIGH (ref 11.5–14.5)
WBC: 7.4 10*3/uL (ref 3.8–10.6)
WBC: 8.8 10*3/uL (ref 3.8–10.6)

## 2017-04-21 LAB — BASIC METABOLIC PANEL
ANION GAP: 8 (ref 5–15)
BUN: 19 mg/dL (ref 6–20)
CHLORIDE: 104 mmol/L (ref 101–111)
CO2: 29 mmol/L (ref 22–32)
Calcium: 9.6 mg/dL (ref 8.9–10.3)
Creatinine, Ser: 1.22 mg/dL (ref 0.61–1.24)
GFR calc Af Amer: >60 mL/min (ref >60)
GFR calc non Af Amer: >60 mL/min (ref >60)
GLUCOSE: 93 mg/dL (ref 65–99)
POTASSIUM: 4.5 mmol/L (ref 3.5–5.1)
Sodium: 141 mmol/L (ref 135–145)

## 2017-04-21 LAB — COMPREHENSIVE METABOLIC PANEL
ALT: 53 U/L (ref 17–63)
ANION GAP: 8 (ref 5–15)
AST: 66 U/L — ABNORMAL HIGH (ref 15–41)
Albumin: 4.2 g/dL (ref 3.5–5.0)
Alkaline Phosphatase: 98 U/L (ref 38–126)
BUN: 20 mg/dL (ref 6–20)
CALCIUM: 9.5 mg/dL (ref 8.9–10.3)
CHLORIDE: 104 mmol/L (ref 101–111)
CO2: 27 mmol/L (ref 22–32)
CREATININE: 1.3 mg/dL — AB (ref 0.61–1.24)
Glucose, Bld: 118 mg/dL — ABNORMAL HIGH (ref 65–99)
Potassium: 4.4 mmol/L (ref 3.5–5.1)
SODIUM: 139 mmol/L (ref 135–145)
Total Bilirubin: 0.7 mg/dL (ref 0.3–1.2)
Total Protein: 8.8 g/dL — ABNORMAL HIGH (ref 6.5–8.1)

## 2017-04-21 LAB — LIPASE, BLOOD: LIPASE: 39 U/L (ref 11–51)

## 2017-04-21 LAB — AMMONIA: Ammonia: 105 umol/L — ABNORMAL HIGH (ref 9–35)

## 2017-04-21 MED ORDER — LACTULOSE 10 GM/15ML PO SOLN
30.0000 g | Freq: Once | ORAL | Status: AC
  Administered 2017-04-21: 30 g via ORAL
  Filled 2017-04-21: qty 60

## 2017-04-21 MED ORDER — LEVETIRACETAM 500 MG PO TABS
1500.0000 mg | ORAL_TABLET | Freq: Once | ORAL | Status: AC
  Administered 2017-04-21: 1500 mg via ORAL
  Filled 2017-04-21: qty 3

## 2017-04-21 MED ORDER — IOPAMIDOL (ISOVUE-300) INJECTION 61%
125.0000 mL | Freq: Once | INTRAVENOUS | Status: AC | PRN
  Administered 2017-04-21: 125 mL via INTRAVENOUS

## 2017-04-21 MED ORDER — SUCRALFATE 1 G PO TABS
1.0000 g | ORAL_TABLET | Freq: Once | ORAL | Status: AC
  Administered 2017-04-21: 1 g via ORAL
  Filled 2017-04-21: qty 1

## 2017-04-21 NOTE — Discharge Instructions (Signed)
Make sure to take your medications including the Keppra 1500mg  twice a day and lactulose 4 times a day. You should not drink alcohol or you will keep having seizures and will continue to get your liver sick which can soon lead to your death. Follow up with your doctor in 3 days.

## 2017-04-21 NOTE — ED Notes (Signed)
Pt was seen in er earlier today and now is back with a fall.  Pt was not walking with walker and fell.  Pt has abd pain.  Vomited x 1 and reports blood in emesis.  Pt alert.  Iv started and labs sent  Family with ptt   Pt on 2 liters oxygen.

## 2017-04-21 NOTE — ED Triage Notes (Signed)
Pt presents to ED via [facility] for leg weakness. EMS reports that pt was at home and had 2 seizures today, EMS did not get much information about seizures. Stated brother taped them but wouldn't say much else than that. Pt states he was lying in bed when he had seizures. Pt states he relapsed for alcohol yesterday after 14 days, drank 3 beers. Pt states he can't stand up. EMS reports that FD got pt to stand and sit multiple times before coming to ED. EMS reports that pt doesn't take evening dose of keppra. Pt states he has taken everything he is supposed to except lactulose. Pt on 2 L nasal cannula chronically.   EMS CBG 83, HR 60's. 02 94% on 2 L. Pt alert and oriented upon arrival. Denies pain.

## 2017-04-21 NOTE — ED Notes (Signed)
Report off to rebecca rn 

## 2017-04-21 NOTE — ED Notes (Signed)
Pt given urinal to use sitting up in bed.    Seizure pads placed on each side of pt with side rails up.

## 2017-04-21 NOTE — ED Notes (Signed)
Mother at bedside.

## 2017-04-21 NOTE — ED Triage Notes (Signed)
Pt reports lost balance and fell on the floor hit his abdomen and vomited once reports blood in emesis.

## 2017-04-21 NOTE — ED Provider Notes (Signed)
[facility] Emergency Department Provider Note  ____________________________________________  Time seen: Approximately 4:09 PM  I have reviewed the triage vital signs and the nursing notes.   HISTORY  Chief Complaint Extremity Weakness   HPI Howard [name] is a 36 y.o. 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 Patient was discharged from the hospital yesterday after being admitted for hematemesis, elevated ammonia level, and kidney injury. Patient has been home for 24 hours and has only taken one lactulose when his supposed to take it 4 times a day. He said yesterday he relapsed and had 3 beers. He had been 14 days sober.Endorses compliance with his Keppra. He denies anxiety, nausea, vomiting, diarrhea, chest pain or shortness of breath, abdominal pain, tremors.  Past Medical History:  Diagnosis Date  . Alcoholic cirrhosis of liver with ascites (HCC)   . Alcoholism (HCC)   . Anemia   . Atrial fibrillation (HCC)   . COPD (chronic obstructive pulmonary disease) (HCC)   . Depression   . Diabetes mellitus, type II (HCC)   . Esophageal varices (HCC)   . Heart disease    irregular heart beat (palpitations) and heart murmur  . Hyperlipemia   . Hypertension   . Liver disease   . Multiple thyroid nodules   . Portal hypertensive gastropathy (HCC)   . Schizophrenia (HCC)   . Seizures [facility](HCC)     Patient Active Problem List   Diagnosis Date Noted  . Esophageal varices without bleeding (HCC)   . Hematemesis 04/05/2017  . Upper GI bleed   . Iron deficiency anemia due to chronic blood loss 02/16/2017  . Nausea 01/18/2017  . Respiratory failure with hypoxia (HCC) 12/14/2016  . Seizures (HCC) 12/12/2016  . DNR (do not resuscitate) discussion 08/11/2016  . Muscle weakness (generalized)   . GI bleed 08/07/2016  . OSA (obstructive sleep apnea) 04/29/2016  .  Anemia 04/29/2016  . Thrombocytopenia (HCC) 04/29/2016  . Coagulopathy (HCC) 04/29/2016  . Obesity 04/29/2016  . Controlled type 2 diabetes mellitus without complication (HCC) 01/15/2016  . Essential (primary) hypertension 12/11/2015  . Pure hypercholesterolemia 12/11/2015  . Hepatic encephalopathy (HCC) 10/16/2015  . 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 Elevated transaminase level 07/04/2015  . Alcohol abuse 05/31/2015  . Paranoid schizophrenia (HCC) 03/20/2015    Past Surgical History:  Procedure Laterality Date  . ESOPHAGOGASTRODUODENOSCOPY N/A 10/05/2015   Procedure: ESOPHAGOGASTRODUODENOSCOPY (EGD);  Surgeon: Elnita MaxwellMatthew Gordon Rein, MD;  [facility];  Service: Endoscopy;  Laterality: N/A;  . ESOPHAGOGASTRODUODENOSCOPY (EGD) WITH PROPOFOL N/A 08/03/2015   Procedure: ESOPHAGOGASTRODUODENOSCOPY (EGD) WITH PROPOFOL;  Surgeon: Elnita MaxwellMatthew Gordon Rein, MD;  [facility];  Service: Endoscopy;  Laterality: N/A;  . ESOPHAGOGASTRODUODENOSCOPY (EGD) WITH PROPOFOL N/A 08/31/2015   Procedure: ESOPHAGOGASTRODUODENOSCOPY (EGD) WITH PROPOFOL;  Surgeon: Elnita MaxwellMatthew Gordon Rein, MD;  [facility];  Service: Endoscopy;  Laterality: N/A;  . ESOPHAGOGASTRODUODENOSCOPY (EGD) WITH PROPOFOL N/A 04/04/2016   Procedure: ESOPHAGOGASTRODUODENOSCOPY (EGD) WITH PROPOFOL;  Surgeon: Scot Junobert T Elliott, MD;  [facility];  Service: Endoscopy;  Laterality: N/A;  . ESOPHAGOGASTRODUODENOSCOPY (EGD) WITH PROPOFOL N/A 11/13/2016   Procedure: ESOPHAGOGASTRODUODENOSCOPY (EGD) WITH PROPOFOL;  Surgeon: Wyline MoodKiran Anna, MD;  Location: [facility] ENDOSCOPY;  Service: Endoscopy;  Laterality: N/A;  . ESOPHAGOGASTRODUODENOSCOPY (EGD) WITH PROPOFOL N/A 04/07/2017   Procedure: ESOPHAGOGASTRODUODENOSCOPY (EGD) WITH PROPOFOL;  Surgeon: Midge MiniumWohl, Darren, MD;  Location: [facility] ENDOSCOPY;  Service: Endoscopy;  Laterality: N/A;  .  NO PAST SURGERIES      Prior to Admission medications     Medication Sig Start Date End Date Taking? Authorizing Provider  acamprosate (CAMPRAL) 333 MG tablet Take 2 tablets (666 mg total) by mouth 3 (three) times daily with meals. 03/24/17  Yes [name], [name], MD  citalopram (CELEXA) 20 MG tablet Take 20 mg by mouth daily. 04/07/17  Yes [provider]  feeding supplement, ENSURE ENLIVE, (ENSURE ENLIVE) LIQD Take 237 mLs by mouth 2 (two) times daily between meals. 04/16/17  Yes Alford Highland, MD  ferrous sulfate 325 (65 FE) MG tablet Take 3 tablets (975 mg total) by mouth every Monday, Wednesday, and Friday. With breakfast 01/21/17  Yes [name], NP  folic acid (FOLVITE) 1 MG tablet Take 1 tablet (1 mg total) by mouth daily. 04/16/17  Yes Wieting, Richard, MD  furosemide (LASIX) 20 MG tablet Take 20 mg by mouth daily.   Yes [provider]  lactulose (CHRONULAC) 10 GM/15ML solution Take 45 mLs (30 g total) by mouth 4 (four) times daily. 04/16/17  Yes Alford Highland, MD  levETIRAcetam (KEPPRA) 750 MG tablet Take 2 tablets (1,500 mg total) by mouth 2 (two) times daily. 12/11/16 04/05/18 Yes Merrily Brittle, MD  losartan (COZAAR) 25 MG tablet Take 1 tablet (25 mg total) by mouth daily. 03/11/17  Yes [name], NP  lurasidone (LATUDA) 80 MG TABS tablet Take 1 tablet (80 mg total) by mouth daily with supper. 03/24/17  Yes [name], [name], MD  metFORMIN (GLUCOPHAGE) 500 MG tablet TAKE 1 TABLET BY MOUTH 2 TIMES DAILY WITH MEALS 03/26/17  Yes [name], NP  nadolol (CORGARD) 20 MG tablet Take 1 tablet (20 mg total) by mouth daily. 03/11/17  Yes [name], NP  omeprazole (PRILOSEC) 40 MG capsule Take 1 capsule (40 mg total) by mouth 2 (two) times daily. 04/16/17  Yes Wieting, Richard, MD  rifaximin (XIFAXAN) 550 MG TABS tablet Take 1 tablet (550 mg total) by mouth 2 (two) times daily. 07/09/16  Yes Katharina Caper, MD  spironolactone (ALDACTONE) 50 MG tablet Take 1 tablet (50 mg total) by mouth 2 (two) times  daily. 03/02/17  Yes [name], NP  sucralfate (CARAFATE) 1 g tablet Take 1 tablet by mouth 4 (four) times daily.   Yes [provider]  thiamine (VITAMIN B-1) 100 MG tablet Take 100 mg by mouth daily.   Yes [provider]  traZODone (DESYREL) 100 MG tablet Take 1 tablet (100 mg total) by mouth at bedtime as needed for sleep. 03/24/17  Yes [name], [name], MD  albuterol (PROVENTIL HFA;VENTOLIN HFA) 108 (90 Base) MCG/ACT inhaler Inhale 2 puffs into the lungs every 6 (six) hours as needed for wheezing or shortness of breath. Patient taking differently: Inhale 2 puffs into the lungs every 4 (four) hours as needed for wheezing or shortness of breath.  10/08/16   Shaune Pollack, MD  [facility] FORMULARY     [provider]  Oral Medication Containers (ALARM CLOCK PILL BOX) MISC 1 each by Does not apply route daily. 04/09/17   [name], NP    Allergies Tramadol  Family History  Problem Relation Age of Onset  . Heart disease Mother   . Hypertension Mother   . Hyperlipidemia Mother   . Stroke Father   . Heart attack Father   . Hypertension Father   . Heart disease Father   . Alcohol abuse Father   . Heart disease Brother  Social History Social History  Substance Use Topics  . Smoking status: Never Smoker  . Smokeless tobacco: Never Used  . Alcohol use 0.0 Howard/week     Comment: last drink 14 days ago    Review of Systems  Constitutional: Negative for fever. Eyes: Negative for visual changes. ENT: Negative for sore throat. Neck: No neck pain  Cardiovascular: Negative for chest pain. Respiratory: Negative for shortness of breath. Gastrointestinal: Negative for abdominal pain, vomiting or diarrhea. Genitourinary: Negative for dysuria. Musculoskeletal: Negative for back pain. Skin: Negative for rash. Neurological: Negative for headaches, weakness or numbness. + seizure Psych: No SI or  HI  ____________________________________________   PHYSICAL EXAM:  VITAL SIGNS: ED Triage Vitals  Enc Vitals Group     BP 04/21/17 1604 116/76     Pulse Rate 04/21/17 1604 69     Resp 04/21/17 1604 17     Temp 04/21/17 1604 98 F (36.7 C)     Temp Source 04/21/17 1604 Oral     SpO2 04/21/17 1604 94 %     Weight 04/21/17 1605 (!) 342 lb (155.1 kg)     Height 04/21/17 1605 6\' 3"  (1.905 m)     Head Circumference --      Peak Flow --      Pain Score 04/21/17 1604 0     Pain Loc --      Pain Edu? --      Excl. in GC? --     Constitutional: Alert and oriented. Well appearing and in no apparent distress. HEENT:      Head: Normocephalic and atraumatic.         Eyes: Conjunctivae are normal. Sclera is non-icteric.       Mouth/Throat: Mucous membranes are moist.       Neck: Supple with no signs of meningismus. Cardiovascular: Regular rate and rhythm. No murmurs, gallops, or rubs. 2+ symmetrical distal pulses are present in all extremities. No JVD. Respiratory: Normal respiratory effort. Lungs are clear to auscultation bilaterally. No wheezes, crackles, or rhonchi.  Gastrointestinal: Soft, non tender, and non distended with positive bowel sounds. No rebound or guarding. Musculoskeletal: Nontender with normal range of motion in all extremities. No edema, cyanosis, or erythema of extremities. Neurologic: Normal speech and language. Face is symmetric. Moving all extremities. No gross focal neurologic deficits are appreciated. Skin: Skin is warm, dry and intact. No rash noted. Psychiatric: Mood and affect are normal. Speech and behavior are normal.  ____________________________________________   LABS (all labs ordered are listed, but only abnormal results are displayed)  Labs Reviewed  AMMONIA - Abnormal; Notable for the following:       Result Value   Ammonia 105 (*)    All other components within normal limits  CBC - Abnormal; Notable for the following:    RBC 4.23 (*)     Hemoglobin 12.1 (*)    HCT 37.0 (*)    RDW 18.5 (*)    Platelets 138 (*)    All other components within normal limits  BASIC METABOLIC PANEL  LEVETIRACETAM LEVEL   ____________________________________________  EKG  none  ____________________________________________  RADIOLOGY  none  ____________________________________________   PROCEDURES  Procedure(s) performed: None Procedures Critical Care performed:  None ____________________________________________   INITIAL IMPRESSION / ASSESSMENT AND PLAN / ED COURSE  36 y.o. 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 Patient with no  signs or symptoms or withdrawal at this time. This is patient's 10th visit to the emergency room only in the month of July including 5 admissions with the most recent one yesterday. Will give a dose of lactulose and monitor until patient's family is at the bedside. Had a long discussion with the patient about the dangers of continued drinking especially with his medical conditions. Patient is apologetic and feels bad. 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 He is at baseline at this time. No asterixis, no confusion.  Clinical Course as of Apr 21 1721  Tue Apr 21, 2017  1700 Labs showing a slightly elevated ammonia for this patient at 105 with no evidence of hepatic encephalopathy in the setting of not taking his lactulose. Patient to his baseline. We'll give a dose of lactulose. No evidence of hyponatremia, hypoglycemia, or other electrolyte abnormalities. There has been some concern from patient's brother that he has not been taking his Keppra p.m. dose. We'll give this dose here of 1500mg  and discharge home with family member.  [CV]    Clinical Course User Index [CV] Nita SickleVeronese, [place], MD    Pertinent labs &  imaging results that were available during my care of the patient were reviewed by me and considered in my medical decision making (see chart for details).    ____________________________________________   FINAL CLINICAL IMPRESSION(S) / ED DIAGNOSES  Final diagnoses:  Seizure (HCC)  Alcohol abuse  Increased ammonia level  Non compliance w medication regimen      NEW MEDICATIONS STARTED DURING THIS VISIT:  New Prescriptions   No medications on file     Note:  This document was prepared using Dragon voice recognition software and may include unintentional dictation errors.    Nita SickleVeronese, Moore, MD 04/21/17 [phone]

## 2017-04-22 ENCOUNTER — Emergency Department
Admission: EM | Admit: 2017-04-22 | Discharge: 2017-04-23 | Disposition: A | Discharge status: Home / Self Care | Payer: Medicare Other | Attending: Emergency Medicine | Admitting: Emergency Medicine

## 2017-04-22 ENCOUNTER — Telehealth: Payer: Self-pay

## 2017-04-22 ENCOUNTER — Other Ambulatory Visit: Payer: Self-pay

## 2017-04-22 DIAGNOSIS — I10 Essential (primary) hypertension: Secondary | ICD-10-CM | POA: Insufficient documentation

## 2017-04-22 DIAGNOSIS — J449 Chronic obstructive pulmonary disease, unspecified: Secondary | ICD-10-CM | POA: Diagnosis not present

## 2017-04-22 DIAGNOSIS — I4891 Unspecified atrial fibrillation: Secondary | ICD-10-CM | POA: Insufficient documentation

## 2017-04-22 DIAGNOSIS — E119 Type 2 diabetes mellitus without complications: Secondary | ICD-10-CM | POA: Insufficient documentation

## 2017-04-22 DIAGNOSIS — R569 Unspecified convulsions: Secondary | ICD-10-CM | POA: Diagnosis not present

## 2017-04-22 LAB — CBC WITH DIFFERENTIAL/PLATELET
BASOS ABS: 0.1 10*3/uL (ref 0–0.1)
Basophils Relative: 1 %
Eosinophils Absolute: 0.2 10*3/uL (ref 0–0.7)
Eosinophils Relative: 3 %
HEMATOCRIT: 36.5 % — AB (ref 40.0–52.0)
Hemoglobin: 11.9 g/dL — ABNORMAL LOW (ref 13.0–18.0)
LYMPHS ABS: 1.6 10*3/uL (ref 1.0–3.6)
LYMPHS PCT: 18 %
MCH: 28.6 pg (ref 26.0–34.0)
MCHC: 32.7 g/dL (ref 32.0–36.0)
MCV: 87.3 fL (ref 80.0–100.0)
MONO ABS: 0.8 10*3/uL (ref 0.2–1.0)
Monocytes Relative: 9 %
NEUTROS ABS: 6 10*3/uL (ref 1.4–6.5)
Neutrophils Relative %: 69 %
Platelets: 129 10*3/uL — ABNORMAL LOW (ref 150–440)
RBC: 4.18 MIL/uL — AB (ref 4.40–5.90)
RDW: 18.4 % — AB (ref 11.5–14.5)
WBC: 8.7 10*3/uL (ref 3.8–10.6)

## 2017-04-22 LAB — COMPREHENSIVE METABOLIC PANEL
ALT: 51 U/L (ref 17–63)
AST: 64 U/L — AB (ref 15–41)
Albumin: 4.1 g/dL (ref 3.5–5.0)
Alkaline Phosphatase: 94 U/L (ref 38–126)
Anion gap: 7 (ref 5–15)
BILIRUBIN TOTAL: 0.6 mg/dL (ref 0.3–1.2)
BUN: 17 mg/dL (ref 6–20)
CALCIUM: 9.6 mg/dL (ref 8.9–10.3)
CO2: 29 mmol/L (ref 22–32)
CREATININE: 1.17 mg/dL (ref 0.61–1.24)
Chloride: 103 mmol/L (ref 101–111)
GFR calc Af Amer: >60 mL/min (ref >60)
Glucose, Bld: 96 mg/dL (ref 65–99)
Potassium: 4.4 mmol/L (ref 3.5–5.1)
Sodium: 139 mmol/L (ref 135–145)
TOTAL PROTEIN: 8.3 g/dL — AB (ref 6.5–8.1)

## 2017-04-22 LAB — ETHANOL: Alcohol, Ethyl (B): <5 mg/dL (ref <5)

## 2017-04-22 LAB — TROPONIN I

## 2017-04-22 NOTE — Discharge Instructions (Signed)
Please follow up with your primary care physician.

## 2017-04-22 NOTE — ED Provider Notes (Signed)
[facility] Emergency Department Provider Note       Time seen: ----------------------------------------- 7:38 PM on 04/22/2017 -----------------------------------------     I have reviewed the triage vital signs and the nursing notes.   HISTORY   Chief Complaint No chief complaint on file.    HPI Howard [name] is a 36 y.o. male who presents to the ED for possible seizures. Patient reports that he has had 2 seizures in the last 24 hours. Patient was recently for seen for same. Patient reports his seizure medications were increased but this has not helped abate his seizures. Patient states he is having trouble walking, he denies fevers, chills or other complaints.   Past Medical History:  Diagnosis Date  . Alcoholic cirrhosis of liver with ascites (HCC)   . Alcoholism (HCC)   . Anemia   . Atrial fibrillation (HCC)   . COPD (chronic obstructive pulmonary disease) (HCC)   . Depression   . Diabetes mellitus, type II (HCC)   . Esophageal varices (HCC)   . Heart disease    irregular heart beat (palpitations) and heart murmur  . Hyperlipemia   . Hypertension   . Liver disease   . Multiple thyroid nodules   . Portal hypertensive gastropathy (HCC)   . Schizophrenia (HCC)   . Seizures [facility](HCC)     Patient Active Problem List   Diagnosis Date Noted  . Esophageal varices without bleeding (HCC)   . Hematemesis 04/05/2017  . Upper GI bleed   . Iron deficiency anemia due to chronic blood loss 02/16/2017  . Nausea 01/18/2017  . Respiratory failure with hypoxia (HCC) 12/14/2016  . Seizures (HCC) 12/12/2016  . DNR (do not resuscitate) discussion 08/11/2016  . Muscle weakness (generalized)   . GI bleed 08/07/2016  . OSA (obstructive sleep apnea) 04/29/2016  . Anemia 04/29/2016  . Thrombocytopenia (HCC) 04/29/2016  . Coagulopathy (HCC) 04/29/2016  . Obesity 04/29/2016  . Controlled type 2 diabetes mellitus without complication (HCC) 01/15/2016  .  Essential (primary) hypertension 12/11/2015  . Pure hypercholesterolemia 12/11/2015  . Hepatic encephalopathy (HCC) 10/16/2015  . 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 Elevated transaminase level 07/04/2015  . Alcohol abuse 05/31/2015  . Paranoid schizophrenia (HCC) 03/20/2015    Past Surgical History:  Procedure Laterality Date  . ESOPHAGOGASTRODUODENOSCOPY N/A 10/05/2015   Procedure: ESOPHAGOGASTRODUODENOSCOPY (EGD);  Surgeon: Elnita MaxwellMatthew Gordon Rein, MD;  [facility];  Service: Endoscopy;  Laterality: N/A;  . ESOPHAGOGASTRODUODENOSCOPY (EGD) WITH PROPOFOL N/A 08/03/2015   Procedure: ESOPHAGOGASTRODUODENOSCOPY (EGD) WITH PROPOFOL;  Surgeon: Elnita MaxwellMatthew Gordon Rein, MD;  [facility];  Service: Endoscopy;  Laterality: N/A;  . ESOPHAGOGASTRODUODENOSCOPY (EGD) WITH PROPOFOL N/A 08/31/2015   Procedure: ESOPHAGOGASTRODUODENOSCOPY (EGD) WITH PROPOFOL;  Surgeon: Elnita MaxwellMatthew Gordon Rein, MD;  [facility];  Service: Endoscopy;  Laterality: N/A;  . ESOPHAGOGASTRODUODENOSCOPY (EGD) WITH PROPOFOL N/A 04/04/2016   Procedure: ESOPHAGOGASTRODUODENOSCOPY (EGD) WITH PROPOFOL;  Surgeon: Scot Junobert T Elliott, MD;  [facility];  Service: Endoscopy;  Laterality: N/A;  . ESOPHAGOGASTRODUODENOSCOPY (EGD) WITH PROPOFOL N/A 11/13/2016   Procedure: ESOPHAGOGASTRODUODENOSCOPY (EGD) WITH PROPOFOL;  Surgeon: Wyline MoodKiran Anna, MD;  Location: [facility] ENDOSCOPY;  Service: Endoscopy;  Laterality: N/A;  . ESOPHAGOGASTRODUODENOSCOPY (EGD) WITH PROPOFOL N/A 04/07/2017   Procedure: ESOPHAGOGASTRODUODENOSCOPY (EGD) WITH PROPOFOL;  Surgeon: Midge MiniumWohl, Darren, MD;  Location: [facility] ENDOSCOPY;  Service: Endoscopy;  Laterality: N/A;  . NO PAST SURGERIES      Allergies Tramadol  Social History Social History  Substance Use Topics  . Smoking status:  Never Smoker  . Smokeless tobacco: Never Used  . Alcohol use 0.0 oz/week     Comment: last drink 14 days ago    Review of  Systems Constitutional: Negative for fever. Cardiovascular: Negative for chest pain. Respiratory: Negative for shortness of breath. Gastrointestinal: Negative for abdominal pain, vomiting and diarrhea. Genitourinary: Negative for dysuria. Musculoskeletal: Negative for back pain. Skin: Negative for rash. Neurological: Negative for headaches, Positive for difficulty walking seizures  All systems negative/normal/unremarkable except as stated in the HPI  ____________________________________________   PHYSICAL EXAM:  VITAL SIGNS: ED Triage Vitals  Enc Vitals Group     BP      Pulse      Resp      Temp      Temp src      SpO2      Weight      Height      Head Circumference      Peak Flow      Pain Score      Pain Loc      Pain Edu?      Excl. in GC?     Constitutional: Alert and oriented. No distress Eyes: Conjunctivae are normal. Normal extraocular movements. ENT   Head: Normocephalic and atraumatic.   Nose: No congestion/rhinnorhea.   Mouth/Throat: Mucous membranes are moist.   Neck: No stridor. Cardiovascular: Normal rate, regular rhythm. No murmurs, rubs, or gallops. Respiratory: Normal respiratory effort without tachypnea nor retractions. Breath sounds are clear and equal bilaterally. No wheezes/rales/rhonchi. Gastrointestinal: Soft and nontender. Normal bowel sounds Musculoskeletal: Nontender with normal range of motion in extremities. No lower extremity tenderness nor edema. Neurologic:  Normal speech and language. No gross focal neurologic deficits are appreciated.  Skin:  Skin is warm, dry and intact. No rash noted. Psychiatric: Mood and affect are normal. Speech and behavior are normal.  ____________________________________________  EKG: Interpreted by me.Sinus rhythm rate of 60 bpm, normal PR interval, normal QRS, normal QT.  ____________________________________________  ED COURSE:  Pertinent labs & imaging results that were available during  my care of the patient were reviewed by me and considered in my medical decision making (see chart for details). Patient presents for seizures and difficulty walking, we will assess with labs and imaging as indicated.   Procedures ____________________________________________   LABS (pertinent positives/negatives)  Labs Reviewed  CBC WITH DIFFERENTIAL/PLATELET - Abnormal; Notable for the following:       Result Value   RBC 4.18 (*)    Hemoglobin 11.9 (*)    HCT 36.5 (*)    RDW 18.4 (*)    Platelets 129 (*)    All other components within normal limits  COMPREHENSIVE METABOLIC PANEL - Abnormal; Notable for the following:    Total Protein 8.3 (*)    AST 64 (*)    All other components within normal limits  TROPONIN I  ETHANOL   ____________________________________________  FINAL ASSESSMENT AND PLAN  Seizures  Plan: Patient's labs and imaging were dictated above. Patient had presented for Possible seizures. His workup here was unremarkable. He is encouraged to continue his home medications and he is stable for outpatient follow-up.   Emily FilbertWilliams, Jonathan E, MD   Note: This note was generated in part or whole with voice recognition software. Voice recognition is usually quite accurate but there are transcription errors that can and very often do occur. I apologize for any typographical errors that were not detected and corrected.     Emily FilbertWilliams, Jonathan E, MD 04/22/17  2243  

## 2017-04-22 NOTE — ED Provider Notes (Addendum)
[facility] Emergency Department Provider Note   ____________________________________________   First MD Initiated Contact with Patient 04/21/17 2328     (approximate)  I have reviewed the triage vital signs and the nursing notes.   HISTORY  Chief Complaint Fall    HPI Howard [name] is a 36 y.o. male who comes into the hospital today after a fall in his stomach. The patient is a frequent visitor to the emergency department and was seen here earlier today. The patient states that he tripped in the kitchen lost his balance and fell. He reports that he did hit his head as well. After he sat up he vomited and he reports that there was some blood in it. He also notes that he sees a lump on his belly that he hasn't seen before and it swollen. He rates his pain a 4-5 out of 10 in intensity. The patient has a history of gastritis and grade 1 esophageal varices confirmed by endoscopy that is not uncommon for him to have some blood in his emesis whenever he does vomit. The patient denies drinking any alcohol in the last 24 hours. He only had one episode of emesis and denies any further episodes. He states that he does have a mild headache. He did not have any loss of consciousness as well. The patient and his mother were concerned about the swelling in his abdomen so they decided to come into the hospital for evaluation today.   Past Medical History:  Diagnosis Date  . Alcoholic cirrhosis of liver with ascites (HCC)   . Alcoholism (HCC)   . Anemia   . Atrial fibrillation (HCC)   . COPD (chronic obstructive pulmonary disease) (HCC)   . Depression   . Diabetes mellitus, type II (HCC)   . Esophageal varices (HCC)   . Heart disease    irregular heart beat (palpitations) and heart murmur  . Hyperlipemia   . Hypertension   . Liver disease   . Multiple thyroid nodules   . Portal hypertensive gastropathy (HCC)   . Schizophrenia (HCC)   . Seizures [facility])     Patient  Active Problem List   Diagnosis Date Noted  . Esophageal varices without bleeding (HCC)   . Hematemesis 04/05/2017  . Upper GI bleed   . Iron deficiency anemia due to chronic blood loss 02/16/2017  . Nausea 01/18/2017  . Respiratory failure with hypoxia (HCC) 12/14/2016  . Seizures (HCC) 12/12/2016  . DNR (do not resuscitate) discussion 08/11/2016  . Muscle weakness (generalized)   . GI bleed 08/07/2016  . OSA (obstructive sleep apnea) 04/29/2016  . Anemia 04/29/2016  . Thrombocytopenia (HCC) 04/29/2016  . Coagulopathy (HCC) 04/29/2016  . Obesity 04/29/2016  . Controlled type 2 diabetes mellitus without complication (HCC) 01/15/2016  . Essential (primary) hypertension 12/11/2015  . Pure hypercholesterolemia 12/11/2015  . Hepatic encephalopathy (HCC) 10/16/2015  . 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 Elevated transaminase level 07/04/2015  . Alcohol abuse 05/31/2015  . Paranoid schizophrenia (HCC) 03/20/2015    Past Surgical History:  Procedure Laterality Date  . ESOPHAGOGASTRODUODENOSCOPY N/A 10/05/2015   Procedure: ESOPHAGOGASTRODUODENOSCOPY (EGD);  Surgeon: Elnita Maxwell, MD;  Location: [facility] ENDOSCOPY;  Service: Endoscopy;  Laterality: N/A;  . ESOPHAGOGASTRODUODENOSCOPY (EGD) WITH PROPOFOL N/A 08/03/2015   Procedure: ESOPHAGOGASTRODUODENOSCOPY (EGD) WITH PROPOFOL;  Surgeon: Elnita Maxwell, MD;  Location: [facility] ENDOSCOPY;  Service: Endoscopy;  Laterality: N/A;  . ESOPHAGOGASTRODUODENOSCOPY (EGD) WITH PROPOFOL N/A 08/31/2015   Procedure:  ESOPHAGOGASTRODUODENOSCOPY (EGD) WITH PROPOFOL;  Surgeon: Elnita Maxwell, MD;  Location: [facility] ENDOSCOPY;  Service: Endoscopy;  Laterality: N/A;  . ESOPHAGOGASTRODUODENOSCOPY (EGD) WITH PROPOFOL N/A 04/04/2016   Procedure: ESOPHAGOGASTRODUODENOSCOPY (EGD) WITH PROPOFOL;  Surgeon: Scot Jun, MD;  Location: [facility] ENDOSCOPY;  Service: Endoscopy;  Laterality: N/A;  . ESOPHAGOGASTRODUODENOSCOPY (EGD) WITH  PROPOFOL N/A 11/13/2016   Procedure: ESOPHAGOGASTRODUODENOSCOPY (EGD) WITH PROPOFOL;  Surgeon: Wyline Mood, MD;  Location: [facility] ENDOSCOPY;  Service: Endoscopy;  Laterality: N/A;  . ESOPHAGOGASTRODUODENOSCOPY (EGD) WITH PROPOFOL N/A 04/07/2017   Procedure: ESOPHAGOGASTRODUODENOSCOPY (EGD) WITH PROPOFOL;  Surgeon: Midge Minium, MD;  Location: [facility] ENDOSCOPY;  Service: Endoscopy;  Laterality: N/A;  . NO PAST SURGERIES      Prior to Admission medications   Medication Sig Start Date End Date Taking? Authorizing Provider  acamprosate (CAMPRAL) 333 MG tablet Take 2 tablets (666 mg total) by mouth 3 (three) times daily with meals. 03/24/17   [name], [name], MD  albuterol (PROVENTIL HFA;VENTOLIN HFA) 108 (90 Base) MCG/ACT inhaler Inhale 2 puffs into the lungs every 6 (six) hours as needed for wheezing or shortness of breath. Patient taking differently: Inhale 2 puffs into the lungs every 4 (four) hours as needed for wheezing or shortness of breath.  10/08/16   Shaune Pollack, MD  citalopram (CELEXA) 20 MG tablet Take 20 mg by mouth daily. 04/07/17   [provider]  feeding supplement, ENSURE ENLIVE, (ENSURE ENLIVE) LIQD Take 237 mLs by mouth 2 (two) times daily between meals. 04/16/17   Alford Highland, MD  ferrous sulfate 325 (65 FE) MG tablet Take 3 tablets (975 mg total) by mouth every Monday, Wednesday, and Friday. With breakfast 01/21/17   [name], NP  folic acid (FOLVITE) 1 MG tablet Take 1 tablet (1 mg total) by mouth daily. 04/16/17   Alford Highland, MD  furosemide (LASIX) 20 MG tablet Take 20 mg by mouth daily.    [provider]  lactulose (CHRONULAC) 10 GM/15ML solution Take 45 mLs (30 g total) by mouth 4 (four) times daily. 04/16/17   Alford Highland, MD  levETIRAcetam (KEPPRA) 750 MG tablet Take 2 tablets (1,500 mg total) by mouth 2 (two) times daily. 12/11/16 04/05/18  Merrily Brittle, MD  losartan (COZAAR) 25 MG tablet Take 1 tablet (25 mg total) by mouth daily. 03/11/17    [name], NP  lurasidone (LATUDA) 80 MG TABS tablet Take 1 tablet (80 mg total) by mouth daily with supper. 03/24/17   [name], [name], MD  metFORMIN (GLUCOPHAGE) 500 MG tablet TAKE 1 TABLET BY MOUTH 2 TIMES DAILY WITH MEALS 03/26/17   [name], NP  nadolol (CORGARD) 20 MG tablet Take 1 tablet (20 mg total) by mouth daily. 03/11/17   [name], NP  NON FORMULARY     [provider]  omeprazole (PRILOSEC) 40 MG capsule Take 1 capsule (40 mg total) by mouth 2 (two) times daily. 04/16/17   Alford Highland, MD  Oral Medication Containers (ALARM CLOCK PILL BOX) MISC 1 each by Does not apply route daily. 04/09/17   [name], NP  rifaximin (XIFAXAN) 550 MG TABS tablet Take 1 tablet (550 mg total) by mouth 2 (two) times daily. 07/09/16   Katharina Caper, MD  spironolactone (ALDACTONE) 50 MG tablet Take 1 tablet (50 mg total) by mouth 2 (two) times daily. 03/02/17   [name], NP  sucralfate (CARAFATE) 1 g tablet Take 1 tablet by mouth 4 (four) times daily.  [provider]  thiamine (VITAMIN B-1) 100 MG tablet Take 100 mg by mouth daily.    [provider]  traZODone (DESYREL) 100 MG tablet Take 1 tablet (100 mg total) by mouth at bedtime as needed for sleep. 03/24/17   [name], Jackquline DenmarkJohn T, MD    Allergies Tramadol  Family History  Problem Relation Age of Onset  . Heart disease Mother   . Hypertension Mother   . Hyperlipidemia Mother   . Stroke Father   . Heart attack Father   . Hypertension Father   . Heart disease Father   . Alcohol abuse Father   . Heart disease Brother     Social History Social History  Substance Use Topics  . Smoking status: Never Smoker  . Smokeless tobacco: Never Used  . Alcohol use 0.0 oz/week     Comment: last drink 14 days ago    Review of Systems  Constitutional: No fever/chills Eyes: No visual changes. ENT: No sore throat. Cardiovascular: Denies chest pain. Respiratory:  Denies shortness of breath. Gastrointestinal: abdominal pain, nausea, vomiting.  No diarrhea.  No constipation. Genitourinary: Negative for dysuria. Musculoskeletal: Negative for back pain. Skin: Negative for rash. Neurological: headache   ____________________________________________   PHYSICAL EXAM:  VITAL SIGNS: ED Triage Vitals  Enc Vitals Group     BP 04/21/17 2213 (!) 123/43     Pulse Rate 04/21/17 2215 80     Resp 04/21/17 2215 20     Temp 04/21/17 2215 98.3 F (36.8 C)     Temp Source 04/21/17 2215 Oral     SpO2 04/21/17 2215 95 %     Weight 04/21/17 2216 (!) 342 lb (155.1 kg)     Height 04/21/17 2216 6\' 3"  (1.905 m)     Head Circumference --      Peak Flow --      Pain Score 04/21/17 2214 4     Pain Loc --      Pain Edu? --      Excl. in GC? --     Constitutional: Alert and oriented. Well appearing and in mild distress. Eyes: Conjunctivae are normal. PERRL. EOMI. Head: Atraumatic. Nose: No congestion/rhinnorhea. Mouth/Throat: Mucous membranes are moist.  Oropharynx non-erythematous. Neck: No cervical spine tenderness to palpation Cardiovascular: Normal rate, regular rhythm. Grossly normal heart sounds.  Good peripheral circulation. Respiratory: Normal respiratory effort.  No retractions. Lungs CTAB. Gastrointestinal: Soft with some diffuse tenderness to palpation, no bruising noted, Ventral hernia noted with increased intra abdominal pressure. No distention. Positive bowel sounds Musculoskeletal: No lower extremity tenderness nor edema.   Neurologic:  Normal speech and language.  Skin:  Skin is warm, dry and intact.  Psychiatric: Mood and affect are normal.   ____________________________________________   LABS (all labs ordered are listed, but only abnormal results are displayed)  Labs Reviewed  COMPREHENSIVE METABOLIC PANEL - Abnormal; Notable for the following:       Result Value   Glucose, Bld 118 (*)    Creatinine, Ser 1.30 (*)    Total Protein 8.8  (*)    AST 66 (*)    All other components within normal limits  CBC - Abnormal; Notable for the following:    RBC 4.31 (*)    Hemoglobin 12.6 (*)    HCT 38.4 (*)    RDW 18.0 (*)    Platelets 138 (*)    All other components within normal limits  LIPASE, BLOOD   ____________________________________________  EKG  none ____________________________________________  RADIOLOGY  Ct Head Wo Contrast  Result Date: 04/22/2017 CLINICAL DATA:  Fall with head injury. Patient was seen in the ED earlier today. Now is back with a fall. Vomiting. EXAM: CT HEAD WITHOUT CONTRAST CT CERVICAL SPINE WITHOUT CONTRAST TECHNIQUE: Multidetector CT imaging of the head and cervical spine was performed following the standard protocol without intravenous contrast. Multiplanar CT image reconstructions of the cervical spine were also generated. COMPARISON:  CT head 04/17/2017.  CT head 03/25/2017. FINDINGS: CT HEAD FINDINGS Brain: No evidence of acute infarction, hemorrhage, hydrocephalus, extra-axial collection or mass lesion/mass effect. Vascular: No hyperdense vessel or unexpected calcification. Skull: Normal. Negative for fracture or focal lesion. Sinuses/Orbits: No acute finding. Other: None. CT CERVICAL SPINE FINDINGS Alignment: Straightening of the usual cervical lordosis. This may be due to patient positioning but ligamentous injury or muscle spasm could also have this appearance. No anterior subluxation. Normal alignment of the facet joints. C1-2 articulation appears intact. Skull base and vertebrae: No acute fracture. No primary bone lesion or focal pathologic process. Soft tissues and spinal canal: No prevertebral fluid or swelling. No visible canal hematoma. Disc levels:  Intervertebral disc space heights are preserved. Upper chest: Motion artifact. Hypodense left thyroid nodule measuring 2.2 cm diameter. No acute process identified. Other: None. IMPRESSION: 1. No acute intracranial abnormalities. 2. Nonspecific  straightening of usual cervical lordosis. No acute displaced fractures identified. Electronically Signed   By: Burman NievesWilliam  Stevens M.D.   On: 04/22/2017 00:18   Ct Cervical Spine Wo Contrast  Result Date: 04/22/2017 CLINICAL DATA:  Fall with head injury. Patient was seen in the ED earlier today. Now is back with a fall. Vomiting. EXAM: CT HEAD WITHOUT CONTRAST CT CERVICAL SPINE WITHOUT CONTRAST TECHNIQUE: Multidetector CT imaging of the head and cervical spine was performed following the standard protocol without intravenous contrast. Multiplanar CT image reconstructions of the cervical spine were also generated. COMPARISON:  CT head 04/17/2017.  CT head 03/25/2017. FINDINGS: CT HEAD FINDINGS Brain: No evidence of acute infarction, hemorrhage, hydrocephalus, extra-axial collection or mass lesion/mass effect. Vascular: No hyperdense vessel or unexpected calcification. Skull: Normal. Negative for fracture or focal lesion. Sinuses/Orbits: No acute finding. Other: None. CT CERVICAL SPINE FINDINGS Alignment: Straightening of the usual cervical lordosis. This may be due to patient positioning but ligamentous injury or muscle spasm could also have this appearance. No anterior subluxation. Normal alignment of the facet joints. C1-2 articulation appears intact. Skull base and vertebrae: No acute fracture. No primary bone lesion or focal pathologic process. Soft tissues and spinal canal: No prevertebral fluid or swelling. No visible canal hematoma. Disc levels:  Intervertebral disc space heights are preserved. Upper chest: Motion artifact. Hypodense left thyroid nodule measuring 2.2 cm diameter. No acute process identified. Other: None. IMPRESSION: 1. No acute intracranial abnormalities. 2. Nonspecific straightening of usual cervical lordosis. No acute displaced fractures identified. Electronically Signed   By: Burman NievesWilliam  Stevens M.D.   On: 04/22/2017 00:18   Ct Abdomen Pelvis W Contrast  Result Date: 04/22/2017 CLINICAL  DATA:  Initial evaluation for acute trauma, fall. EXAM: CT ABDOMEN AND PELVIS WITH CONTRAST TECHNIQUE: Multidetector CT imaging of the abdomen and pelvis was performed using the standard protocol following bolus administration of intravenous contrast. CONTRAST:  125mL ISOVUE-300 IOPAMIDOL (ISOVUE-300) INJECTION 61% COMPARISON:  Prior CT from 03/25/2017. FINDINGS: Lower chest: Mild scattered atelectatic changes present within the visualized lung bases. Visualized lungs are otherwise clear. Hepatobiliary: Changes consistent with cirrhosis present within the liver. No focal intrahepatic mass. Gallbladder largely contracted. Mild  wall thickening likely related incomplete distension. Gallbladder otherwise unremarkable. No biliary dilatation. Pancreas: Pancreas within normal limits. Spleen: Spleen is enlarged measuring 18 cm in craniocaudad dimension, like related underlying portal hypertension. Spleen otherwise unremarkable. Adrenals/Urinary Tract: Adrenal glands within normal limits. Kidneys equal in size with symmetric enhancement. No nephrolithiasis, hydronephrosis, or focal enhancing renal mass. No hydroureter. Bladder largely decompressed without acute abnormality. Stomach/Bowel: Stomach within normal limits. No evidence for bowel obstruction. Appendix normal. No acute inflammatory changes seen about the bowels. Vascular/Lymphatic: Normal intravascular enhancement seen throughout the intra-abdominal aorta in its branch vessels. Multiple prominent varices present within the upper abdomen, consistent with portal hypertension. Shotty subcentimeter retroperitoneal lymph nodes are stable. No pathologically enlarged intra-abdominal or pelvic lymph nodes identified. Reproductive: Prostate normal. Other: No free air or fluid. No ascites. No mesenteric or retroperitoneal hematoma. Hazy stranding within the mesenteric fat similar to previous, likely related underlying intrinsic liver disease. Musculoskeletal: Stable  compression deformity involving the T7 and T11 vertebral bodies. No acute osseous abnormality. IMPRESSION: 1. No acute intra-abdominal or pelvic process identified. 2. Cirrhosis with evidence for portal hypertension, stable from previous. 3. Stable compression deformities involving the T7 and T11 vertebral bodies. Electronically Signed   By: Rise Mu M.D.   On: 04/22/2017 00:34    ____________________________________________   PROCEDURES  Procedure(s) performed: None  Procedures  Critical Care performed: No  ____________________________________________   INITIAL IMPRESSION / ASSESSMENT AND PLAN / ED COURSE  Pertinent labs & imaging results that were available during my care of the patient were reviewed by me and considered in my medical decision making (see chart for details).  This is a 36 year old male who comes into the hospital today with some abdominal pain and headache after falling at home. The patient states that he tripped and lost his balance and fell straight on his abdomen. Although the patient did have some blood in his emesis he has been seen multiple times for the same and he is not actively vomiting at this time. The patient has a history of varices and alcoholic gastritis. I will give the patient a dose of lactulose as well as a dose of Carafate. I sent the patient for a CT scan of his head, cervical spine and abdomen and he had no acute injury. I will discharge the patient home to have him follow-up with his primary care physician. Given his cirrhosis I will not treat him with Tylenol and since he does have gastritis and will not treat him with nonsteroidals. He should follow-up.      ____________________________________________   FINAL CLINICAL IMPRESSION(S) / ED DIAGNOSES  Final diagnoses:  Fall, initial encounter  Chronic alcoholic gastritis with hemorrhage  Generalized abdominal pain      NEW MEDICATIONS STARTED DURING THIS VISIT:  Discharge  Medication List as of 04/22/2017  1:09 AM       Note:  This document was prepared using Dragon voice recognition software and may include unintentional dictation errors.    Rebecka Apley, MD 04/22/17 1610    Rebecka Apley, MD 04/22/17 [phone]

## 2017-04-22 NOTE — ED Triage Notes (Signed)
Pt arrived via EMS from home. EMS stated that he had two episodes of seizures today. Was seen here on yesterday. EMS states that pt stated that his meds aren't working. Pt has "feeling" in back of head prior to seizures. Pt has 20G IV in L hand per EMS. Pt has Hx of COPD, Diabetes, and HTN.  Pt is on 2L nasal cannula. BS-79 per EMS. Allergic to Tramadol. Dr. Mayford KnifeWilliams at bedside.

## 2017-04-22 NOTE — Telephone Encounter (Signed)
Transition Care Management Follow-up Telephone Call  Date discharged?04/20/2017 with second ED visit on  04/21/2017  How have you been since you were released from the hospital? Doing good   Do you understand why you were in the hospital? Yes    Do you understand the discharge instructions? Yes  Where were you discharged to? Home   Items Reviewed: Medications reviewed: YES Allergies reviewed: YES Dietary changes reviewed: YES Referrals reviewed: YES   Functional Questionnaire:  Activities of Daily Living (ADLs):   states they are independent in the following:  Feeding, bathing, eating, transferring.  States they require assistance with the following: Some assistance with dressing, uses walker  Any transportation issues/concerns?: No   Any patient concerns? Yes, knot on right side of abdomen is slightly larger   Confirmed importance and date/time of follow-up visits scheduled: Yes Provider appt on 04/23/2017 at 2:20pm with Elly ModenaLauren [name],NP.   Confirmed with patient if condition begins to worsen call PCP or go to the ER.  Patient was given the office number and encouraged to call back with question or concerns.  : YES

## 2017-04-23 ENCOUNTER — Telehealth: Payer: Self-pay | Admitting: Nurse Practitioner

## 2017-04-23 ENCOUNTER — Ambulatory Visit (INDEPENDENT_AMBULATORY_CARE_PROVIDER_SITE_OTHER): Payer: Medicare Other | Admitting: Nurse Practitioner

## 2017-04-23 ENCOUNTER — Telehealth: Payer: Self-pay

## 2017-04-23 ENCOUNTER — Encounter: Payer: Self-pay | Admitting: Nurse Practitioner

## 2017-04-23 VITALS — BP 104/62 | HR 70 | Temp 98.3°F | Ht 75.0 in

## 2017-04-23 DIAGNOSIS — Z09 Encounter for follow-up examination after completed treatment for conditions other than malignant neoplasm: Secondary | ICD-10-CM

## 2017-04-23 DIAGNOSIS — K7290 Hepatic failure, unspecified without coma: Secondary | ICD-10-CM | POA: Diagnosis not present

## 2017-04-23 DIAGNOSIS — Z7189 Other specified counseling: Secondary | ICD-10-CM | POA: Diagnosis not present

## 2017-04-23 DIAGNOSIS — K7210 Chronic hepatic failure without coma: Secondary | ICD-10-CM

## 2017-04-23 DIAGNOSIS — K7682 Hepatic encephalopathy: Secondary | ICD-10-CM

## 2017-04-23 LAB — LEVETIRACETAM LEVEL: LEVETIRACETAM: 67.7 ug/mL — AB (ref 10.0–40.0)

## 2017-04-23 NOTE — Telephone Encounter (Signed)
The pt mother called with concerns that she could not get him in for his appt today, because the pt is to weak to stand. Due to his recent visit at the hospital. I verified with the pt and his mother when his next home care visit is from his homehealth nurse. He informed me that someone should come out today. I informed him that we could reschedule his visit, but the pt expressed that he want to keep the visit and his brother should be able to transport him here this afternoon.

## 2017-04-23 NOTE — Progress Notes (Signed)
Subjective:    Patient ID: Howard [name], male    DOB: 1980-11-06, 36 y.o.   MRN: [id number]  Howard [name] is a 36 y.o. male presenting on 04/23/2017 for Hospitalization Follow-up (seizure)   HPI HOSPITAL FOLLOW-UP VISIT Hospital/Location: [facility] Date of Admission: 04/19/2017 Date of Discharge: 04/20/17 Transitions of care telephone call: 04/22/17 made by Marin Roberts, LPN  Reason for Admission: Hematemesis Primary (+Secondary) Diagnosis: Hepatic Encephalopathy (AKI, Hematemesis w/ nausea, bleeding esophageal varices)  FOLLOW-UP  - Hospital H&P and Discharge Summary have been reviewed - Patient presents today 3 days after recent hospitalization for hematemesis secondary to alcoholic cirrhosis of the liver. Pt has liver failure with grade 1 esophageal varices and regular occurrences of hematemesis.  He also suffers with paranoid schizophrenia, alcohol abuse, seizure disorder.  He has support from his mother and brother, but they also suffer from significant medical concerns.  Pt still struggles w/ alcohol abuse.  Currently he is 3 days sober after having a relapse on 7/30.  Prior to this, he had been 14 days sober which is a significant achievement for Howard [name] who had only managed 3-5 days between alcohol consumption over last several months. - He states he has not done well after discharge. He has not had recurrence of hematemesis, but has had significant weakness with falls, seizures.  Pt reports he has not taken his lactulose regularly and has only had one dose since leaving the hospital 3 days ago. He has had 3 ED visits since discharge.  He had a seizure on 7/31 w/ ED visit. At that time he had elevated ammonia level above his baseline.  He received his lactulose and was discharged home. - New medications on discharge: none - Changes to current meds on discharge: discontinue Lotrimin spray  Social History  Substance Use Topics  . Smoking status: Never Smoker  . Smokeless tobacco: Never Used    . Alcohol use 0.0 oz/week     Comment: last drink 14 days ago    Review of Systems Per HPI unless specifically indicated above  Outpatient Encounter Prescriptions as of 04/23/2017  Medication Sig  . acamprosate (CAMPRAL) 333 MG tablet Take 2 tablets (666 mg total) by mouth 3 (three) times daily with meals.  Marland Kitchen albuterol (PROVENTIL HFA;VENTOLIN HFA) 108 (90 Base) MCG/ACT inhaler Inhale 2 puffs into the lungs every 6 (six) hours as needed for wheezing or shortness of breath. (Patient taking differently: Inhale 2 puffs into the lungs every 4 (four) hours as needed for wheezing or shortness of breath. )  . citalopram (CELEXA) 20 MG tablet Take 20 mg by mouth daily.  . feeding supplement, ENSURE ENLIVE, (ENSURE ENLIVE) LIQD Take 237 mLs by mouth 2 (two) times daily between meals.  . folic acid (FOLVITE) 1 MG tablet Take 1 tablet (1 mg total) by mouth daily.  Marland Kitchen levETIRAcetam (KEPPRA) 750 MG tablet Take 2 tablets (1,500 mg total) by mouth 2 (two) times daily.  . ferrous sulfate 325 (65 FE) MG tablet Take 3 tablets (975 mg total) by mouth every Monday, Wednesday, and Friday. With breakfast  . furosemide (LASIX) 20 MG tablet Take 20 mg by mouth daily.  Marland Kitchen lactulose (CHRONULAC) 10 GM/15ML solution Take 45 mLs (30 g total) by mouth 4 (four) times daily.  Marland Kitchen losartan (COZAAR) 25 MG tablet Take 1 tablet (25 mg total) by mouth daily.  Marland Kitchen lurasidone (LATUDA) 80 MG TABS tablet Take 1 tablet (80 mg total) by mouth daily with supper.  . metFORMIN (  GLUCOPHAGE) 500 MG tablet TAKE 1 TABLET BY MOUTH 2 TIMES DAILY WITH MEALS  . nadolol (CORGARD) 20 MG tablet Take 1 tablet (20 mg total) by mouth daily.  . NON FORMULARY   . omeprazole (PRILOSEC) 40 MG capsule Take 1 capsule (40 mg total) by mouth 2 (two) times daily.  . rifaximin (XIFAXAN) 550 MG TABS tablet Take 1 tablet (550 mg total) by mouth 2 (two) times daily.  Marland Kitchen. spironolactone (ALDACTONE) 50 MG tablet Take 1 tablet (50 mg total) by mouth 2 (two) times daily.  .  sucralfate (CARAFATE) 1 g tablet Take 1 tablet by mouth 4 (four) times daily.  Marland Kitchen. thiamine (VITAMIN B-1) 100 MG tablet Take 100 mg by mouth daily.  . traZODone (DESYREL) 100 MG tablet Take 1 tablet (100 mg total) by mouth at bedtime as needed for sleep.  . [DISCONTINUED] Oral Medication Containers (ALARM CLOCK PILL BOX) MISC 1 each by Does not apply route daily.   No facility-administered encounter medications on file as of 04/23/2017.        Objective:    BP 104/62 (BP Location: Right Arm, Patient Position: Sitting, Cuff Size: Normal)   Pulse 70   Temp 98.3 F (36.8 C) (Oral)   Ht 6\' 3"  (1.905 m)   SpO2 95% Comment: 2L oxygen  BMI 42.75 kg/m   Wt Readings from Last 3 Encounters:  04/22/17 (!) 342 lb (155.1 kg)  04/21/17 (!) 342 lb (155.1 kg)  04/21/17 (!) 342 lb (155.1 kg)    Physical Exam  Constitutional: He is oriented to person, place, and time. Vital signs are normal. He appears well-developed. He has a sickly appearance.  HENT:  Right Ear: Hearing, tympanic membrane and external ear normal.  Left Ear: Hearing, tympanic membrane and external ear normal.  Persistent white discharge/plaque appearance of external ear canal.  Pt has had resolution of external ear canal edema and improvement of erythema.  Cardiovascular: Normal rate, regular rhythm, normal heart sounds and intact distal pulses.   Pulmonary/Chest: Effort normal and breath sounds normal. No respiratory distress.  Abdominal: He exhibits distension. He exhibits no mass. Bowel sounds are increased. There is tenderness. There is rebound. There is no guarding. No hernia.  hepatomegaly w/ scratch test, no splenomegaly, no ascites or fluid wave present, rebound tenderness RUQ, & RLQ, wearing depends for stool incontinence.  Musculoskeletal:  Muscle strength 3/5 lower extremities, 4/5 upper extremities.  Neurological: He is alert and oriented to person, place, and time. He displays tremor. Coordination and gait abnormal.    Liver Flap present.  Pt places hands against lap to prevent tremor.  Lower lip also with tremor at rest.  Pt unable to maintain balance.  Able to transfer well from wheelchair to car w/ standby contact assistance.  Unable to ambulate without support.  Pt uses walker at home.  Skin: Skin is warm and dry. No rash noted. There is pallor.  Mildly jaundiced, dusky appearing skin  Psychiatric:  Depressed mood, flat affect.  Speech delayed, but at baseline.  Normal behavior.  Pt denies hallucinations.      Results for orders placed or performed during the hospital encounter of 04/22/17  CBC with Differential  Result Value Ref Range   WBC 8.7 3.8 - 10.6 K/uL   RBC 4.18 (L) 4.40 - 5.90 MIL/uL   Hemoglobin 11.9 (L) 13.0 - 18.0 g/dL   HCT 16.136.5 (L) 09.640.0 - 04.552.0 %   MCV 87.3 80.0 - 100.0 fL   MCH 28.6 26.0 -  34.0 pg   MCHC 32.7 32.0 - 36.0 g/dL   RDW 09.818.4 (H) 11.911.5 - 14.714.5 %   Platelets 129 (L) 150 - 440 K/uL   Neutrophils Relative % 69 %   Neutro Abs 6.0 1.4 - 6.5 K/uL   Lymphocytes Relative 18 %   Lymphs Abs 1.6 1.0 - 3.6 K/uL   Monocytes Relative 9 %   Monocytes Absolute 0.8 0.2 - 1.0 K/uL   Eosinophils Relative 3 %   Eosinophils Absolute 0.2 0 - 0.7 K/uL   Basophils Relative 1 %   Basophils Absolute 0.1 0 - 0.1 K/uL  Comprehensive metabolic panel  Result Value Ref Range   Sodium 139 135 - 145 mmol/L   Potassium 4.4 3.5 - 5.1 mmol/L   Chloride 103 101 - 111 mmol/L   CO2 29 22 - 32 mmol/L   Glucose, Bld 96 65 - 99 mg/dL   BUN 17 6 - 20 mg/dL   Creatinine, Ser 8.291.17 0.61 - 1.24 mg/dL   Calcium 9.6 8.9 - 56.210.3 mg/dL   Total Protein 8.3 (H) 6.5 - 8.1 g/dL   Albumin 4.1 3.5 - 5.0 g/dL   AST 64 (H) 15 - 41 U/L   ALT 51 17 - 63 U/L   Alkaline Phosphatase 94 38 - 126 U/L   Total Bilirubin 0.6 0.3 - 1.2 mg/dL   GFR calc non Af Amer >60 >60 mL/min   GFR calc Af Amer >60 >60 mL/min   Anion gap 7 5 - 15  Troponin I  Result Value Ref Range   Troponin I <0.03 <0.03 ng/mL  Ethanol  Result  Value Ref Range   Alcohol, Ethyl (B) <5 <5 mg/dL      Assessment & Plan:   Problem List Items Addressed This Visit      Digestive   End stage liver disease (HCC)    Currently decompensated from prior baseline functioning.  Pt wearing oxygen 2L [place] and requiring a wheelchair for mobility. Recent hospitalizations and ED utilization high.  Last 6 months - 10 admissions, 16 ED visits w/o admission.  Recurrent hematemesis, hepatic encephalopathy complicated by medication nonadherence and ongoing difficulty with alcohol abuse.  Pt has maintained 14 days sobriety w/ one relapse followed by 3 more days of sobriety.  He states he wants to be successful at quitting alcohol use.    Plan: 1. Discussed code status.  Again, Howard [name] confirms he would like to be resuscitated if he were to have cardiac or respiratory arrest. 2. Encouraged ED utilization for hematemesis if > 3 occurrences of small volume in one day or if large volume of blood in one occurrence.  Discussed that hematemesis will be a regular occurrence for pt.  Pt verbalizes understanding. 3. Approve Physical Therapy for home health. 4. Pt w/ prior palliative care consult from [facility].  They have only had one visit with Howard [name]. Howard [name] is also well connected to [facility].  Pt agrees to hospice care for diagnosis of end stage liver disease.  Order hospice care through [facility] to utilize home care and hospice services w/ existing relationship.        Nervous and Auditory   Hepatic encephalopathy (HCC)    Active liver flap today.  Pt admits to only rare lactulose use citing significant difficulty with bowel movements and taste of medication. Ongoing concern.  Plan: 1. Encouraged 4 doses lactulose daily.  If unable to take 4 doses, must take at least 1.  None today.  Pt educated.  Will receive next pillpacks with stickers as reminder to take lactulose at appropriate dose times.  Needs to pick up next fill today.  Pt  verbalizes understanding for necessity of lactulose to prevent liver flap, seizures, and hospitalization. 2. Follow up 3 weeks or sooner.  Will collaborate closely w/ [facility] for closer follow up as needed.        Other   DNR (do not resuscitate) discussion    Pt remains a full code.       Other Visit Diagnoses    Hospital discharge follow-up    -  Primary Pt w/ frequent hospital and ED utilizations for sequelae of liver disease.  Reviewed most recent hospitalization and ED utilization notes.  Med reconciliation performed today.         I have reviewed the discharge medication list, and have reconciled the current and discharge medications today.  Follow up plan: Return in about 3 weeks (around 05/11/2017) for Liver Failure.   [name], DNP, AGPCNP-BC Adult Gerontology Primary Care Nurse Practitioner [facility] 04/24/2017, 9:04 AM

## 2017-04-23 NOTE — Telephone Encounter (Signed)
Howard [name] with [facility] said he just faxed over a referral order.  His call back number is [phone] if you have any questions.

## 2017-04-23 NOTE — Patient Instructions (Addendum)
Howard [name], Thank you for coming in to clinic today.  1. Consult Hospice w/ [facility]. - [facility] physical therapy for home care - Lactulose, Lactulose, Lactulose, Lactulose!!! FOUR times daily.  TAKE AT LEAST 3! - ED visit if 4 or more bloody vomiting episodes or 1-2 episodes with a large volume of blood.  Lactulose will: - Prevent your liver flap (shaking hands) - Prevent seizures.   Please schedule a follow-up appointment with Howard [name], AGNP. Return in about 3 weeks (around 05/11/2017) for Liver Failure.  Call us if you need to be seen sooner.  If you have any other questions or concerns, please feel free to call the clinic or send a message through MyChart. You may also schedule an earlier appointment if necessary.  You will receive a survey after today's visit either digitally by e-mail or paper by [employer] mail. Your experiences and feedback matter to us.  Please respond so we know how we are doing as we provide care for you.   Howard McardleLauren Ravyn Nikkel, DNP, AGNP-BC Adult Gerontology Nurse Practitioner [facility], [facility]

## 2017-04-24 ENCOUNTER — Telehealth: Payer: Self-pay | Admitting: Emergency Medicine

## 2017-04-24 ENCOUNTER — Inpatient Hospital Stay: Payer: Self-pay | Admitting: Nurse Practitioner

## 2017-04-24 DIAGNOSIS — K7210 Chronic hepatic failure without coma: Secondary | ICD-10-CM | POA: Insufficient documentation

## 2017-04-24 DIAGNOSIS — K7290 Hepatic failure, unspecified without coma: Secondary | ICD-10-CM | POA: Insufficient documentation

## 2017-04-24 NOTE — Assessment & Plan Note (Signed)
Pt remains a full code.

## 2017-04-24 NOTE — Assessment & Plan Note (Addendum)
Currently decompensated from prior baseline functioning.  Pt wearing oxygen 2L [place] and requiring a wheelchair for mobility. Recent hospitalizations and ED utilization high.  Last 6 months - 10 admissions, 16 ED visits w/o admission.  Recurrent hematemesis, hepatic encephalopathy complicated by medication nonadherence and ongoing difficulty with alcohol abuse.  Pt has maintained 14 days sobriety w/ one relapse followed by 3 more days of sobriety.  He states he wants to be successful at quitting alcohol use.    Plan: 1. Discussed code status.  Again, Howard [name] confirms he would like to be resuscitated if he were to have cardiac or respiratory arrest. 2. Encouraged ED utilization for hematemesis if > 3 occurrences of small volume in one day or if large volume of blood in one occurrence.  Discussed that hematemesis will be a regular occurrence for pt.  Pt verbalizes understanding. 3. Approve Physical Therapy for home health. 4. Pt w/ prior palliative care consult from [facility].  They have only had one visit with Howard [name]. Howard [name] is also well connected to [facility].  Pt agrees to hospice care for diagnosis of end stage liver disease.  Order hospice care through [facility] to utilize home care and hospice services w/ existing relationship.

## 2017-04-24 NOTE — Telephone Encounter (Signed)
Attempted to contact pt via phone, due to elevated serum Keppra level , no answer via phone and not able to leave message .  Spoke to Dr.Veronese concerning level, "we need to have pt follow up with neurologist and have levels rechecked"

## 2017-04-24 NOTE — Progress Notes (Signed)
I have reviewed this encounter including the documentation in this note and/or discussed this patient with the provider, Wilhelmina McardleLauren [name], AGPCNP-BC. I am certifying that I agree with the content of this note as supervising physician.  Saralyn PilarAlexander Tynasia Mccaul, DO [facility] 04/24/2017, 12:32 PM

## 2017-04-24 NOTE — Assessment & Plan Note (Signed)
Active liver flap today.  Pt admits to only rare lactulose use citing significant difficulty with bowel movements and taste of medication. Ongoing concern.  Plan: 1. Encouraged 4 doses lactulose daily.  If unable to take 4 doses, must take at least 1.  None today.  Pt educated.  Will receive next pillpacks with stickers as reminder to take lactulose at appropriate dose times.  Needs to pick up next fill today.  Pt verbalizes understanding for necessity of lactulose to prevent liver flap, seizures, and hospitalization. 2. Follow up 3 weeks or sooner.  Will collaborate closely w/ [facility] for closer follow up as needed.

## 2017-04-26 ENCOUNTER — Encounter: Payer: Self-pay | Admitting: Emergency Medicine

## 2017-04-26 ENCOUNTER — Emergency Department: Payer: Medicare Other

## 2017-04-26 ENCOUNTER — Emergency Department
Admission: EM | Admit: 2017-04-26 | Discharge: 2017-04-27 | Disposition: A | Discharge status: Home / Self Care | Payer: Medicare Other | Attending: Emergency Medicine | Admitting: Emergency Medicine

## 2017-04-26 DIAGNOSIS — J449 Chronic obstructive pulmonary disease, unspecified: Secondary | ICD-10-CM | POA: Insufficient documentation

## 2017-04-26 DIAGNOSIS — E119 Type 2 diabetes mellitus without complications: Secondary | ICD-10-CM | POA: Insufficient documentation

## 2017-04-26 DIAGNOSIS — F1020 Alcohol dependence, uncomplicated: Secondary | ICD-10-CM | POA: Diagnosis not present

## 2017-04-26 DIAGNOSIS — Z7984 Long term (current) use of oral hypoglycemic drugs: Secondary | ICD-10-CM | POA: Diagnosis not present

## 2017-04-26 DIAGNOSIS — I10 Essential (primary) hypertension: Secondary | ICD-10-CM | POA: Insufficient documentation

## 2017-04-26 DIAGNOSIS — K920 Hematemesis: Secondary | ICD-10-CM | POA: Diagnosis present

## 2017-04-26 LAB — COMPREHENSIVE METABOLIC PANEL
ALBUMIN: 4 g/dL (ref 3.5–5.0)
ALK PHOS: 104 U/L (ref 38–126)
ALT: 67 U/L — AB (ref 17–63)
ANION GAP: 8 (ref 5–15)
AST: 84 U/L — ABNORMAL HIGH (ref 15–41)
BUN: 14 mg/dL (ref 6–20)
CALCIUM: 9.8 mg/dL (ref 8.9–10.3)
CO2: 26 mmol/L (ref 22–32)
CREATININE: 0.88 mg/dL (ref 0.61–1.24)
Chloride: 104 mmol/L (ref 101–111)
GFR calc Af Amer: >60 mL/min (ref >60)
GFR calc non Af Amer: >60 mL/min (ref >60)
GLUCOSE: 85 mg/dL (ref 65–99)
Potassium: 4.5 mmol/L (ref 3.5–5.1)
SODIUM: 138 mmol/L (ref 135–145)
Total Bilirubin: 0.9 mg/dL (ref 0.3–1.2)
Total Protein: 8.6 g/dL — ABNORMAL HIGH (ref 6.5–8.1)

## 2017-04-26 LAB — CBC WITH DIFFERENTIAL/PLATELET
BASOS ABS: 0 10*3/uL (ref 0–0.1)
BASOS PCT: 1 %
EOS ABS: 0.2 10*3/uL (ref 0–0.7)
Eosinophils Relative: 3 %
HCT: 36.4 % — ABNORMAL LOW (ref 40.0–52.0)
Hemoglobin: 12 g/dL — ABNORMAL LOW (ref 13.0–18.0)
Lymphocytes Relative: 19 %
Lymphs Abs: 1.5 10*3/uL (ref 1.0–3.6)
MCH: 28.8 pg (ref 26.0–34.0)
MCHC: 33.1 g/dL (ref 32.0–36.0)
MCV: 86.8 fL (ref 80.0–100.0)
Monocytes Absolute: 0.8 10*3/uL (ref 0.2–1.0)
Monocytes Relative: 10 %
NEUTROS PCT: 67 %
Neutro Abs: 5.1 10*3/uL (ref 1.4–6.5)
Platelets: 134 10*3/uL — ABNORMAL LOW (ref 150–440)
RBC: 4.19 MIL/uL — AB (ref 4.40–5.90)
RDW: 17.6 % — ABNORMAL HIGH (ref 11.5–14.5)
WBC: 7.6 10*3/uL (ref 3.8–10.6)

## 2017-04-26 NOTE — ED Provider Notes (Signed)
[facility] Emergency Department Provider Note  ____________________________________________   First MD Initiated Contact with Patient 04/26/17 2311     (approximate)  I have reviewed the triage vital signs and the nursing notes.   HISTORY  Chief Complaint Hematemesis    HPI Howard [name] is a 36 y.o. male who comes to the emergency department after vomiting dark red blood several times about an hour prior to arrival. He has a complex past medical history including alcohol abuse and known alcoholic cirrhosis. He had an endoscopy 3 weeks ago showing:  04/07/17 Endoscopy - Grade I esophageal varices. - Gastritis. - Normal examined duodenum. - No specimens collected.  He is currently in no pain. He is currently not nauseated. He reports decreased compliance with all of his home medications. His symptoms began suddenly. They are now resolved. Nothing in particular seemed to make them better or worse except for time.  Past Medical History:  Diagnosis Date  . Alcoholic cirrhosis of liver with ascites (HCC)   . Alcoholism (HCC)   . Anemia   . Atrial fibrillation (HCC)   . COPD (chronic obstructive pulmonary disease) (HCC)   . Depression   . Diabetes mellitus, type II (HCC)   . Esophageal varices (HCC)   . Heart disease    irregular heart beat (palpitations) and heart murmur  . Hyperlipemia   . Hypertension   . Liver disease   . Multiple thyroid nodules   . Portal hypertensive gastropathy (HCC)   . Schizophrenia (HCC)   . Seizures [facility])     Patient Active Problem List   Diagnosis Date Noted  . End stage liver disease (HCC) 04/24/2017  . Esophageal varices without bleeding (HCC)   . Hematemesis 04/05/2017  . Upper GI bleed   . Iron deficiency anemia due to chronic blood loss 02/16/2017  . Nausea 01/18/2017  . Respiratory failure with hypoxia (HCC) 12/14/2016  . Seizures (HCC) 12/12/2016  . DNR (do not resuscitate) discussion 08/11/2016  .  Muscle weakness (generalized)   . GI bleed 08/07/2016  . OSA (obstructive sleep apnea) 04/29/2016  . Anemia 04/29/2016  . Thrombocytopenia (HCC) 04/29/2016  . Coagulopathy (HCC) 04/29/2016  . Obesity 04/29/2016  . Controlled type 2 diabetes mellitus without complication (HCC) 01/15/2016  . Essential (primary) hypertension 12/11/2015  . Pure hypercholesterolemia 12/11/2015  . Hepatic encephalopathy (HCC) 10/16/2015  . 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 Elevated transaminase level 07/04/2015  . Alcohol abuse 05/31/2015  . Paranoid schizophrenia (HCC) 03/20/2015    Past Surgical History:  Procedure Laterality Date  . ESOPHAGOGASTRODUODENOSCOPY N/A 10/05/2015   Procedure: ESOPHAGOGASTRODUODENOSCOPY (EGD);  Surgeon: Elnita Maxwell, MD;  Location: [facility] ENDOSCOPY;  Service: Endoscopy;  Laterality: N/A;  . ESOPHAGOGASTRODUODENOSCOPY (EGD) WITH PROPOFOL N/A 08/03/2015   Procedure: ESOPHAGOGASTRODUODENOSCOPY (EGD) WITH PROPOFOL;  Surgeon: Elnita Maxwell, MD;  Location: [facility] ENDOSCOPY;  Service: Endoscopy;  Laterality: N/A;  . ESOPHAGOGASTRODUODENOSCOPY (EGD) WITH PROPOFOL N/A 08/31/2015   Procedure: ESOPHAGOGASTRODUODENOSCOPY (EGD) WITH PROPOFOL;  Surgeon: Elnita Maxwell, MD;  Location: [facility] ENDOSCOPY;  Service: Endoscopy;  Laterality: N/A;  . ESOPHAGOGASTRODUODENOSCOPY (EGD) WITH PROPOFOL N/A 04/04/2016   Procedure: ESOPHAGOGASTRODUODENOSCOPY (EGD) WITH PROPOFOL;  Surgeon: Scot Jun, MD;  Location: [facility] ENDOSCOPY;  Service: Endoscopy;  Laterality: N/A;  . ESOPHAGOGASTRODUODENOSCOPY (EGD) WITH PROPOFOL N/A 11/13/2016   Procedure: ESOPHAGOGASTRODUODENOSCOPY (EGD) WITH PROPOFOL;  Surgeon: Wyline Mood, MD;  Location: [facility] ENDOSCOPY;  Service: Endoscopy;  Laterality: N/A;  . ESOPHAGOGASTRODUODENOSCOPY (EGD) WITH PROPOFOL N/A  04/07/2017   Procedure: ESOPHAGOGASTRODUODENOSCOPY (EGD) WITH PROPOFOL;  Surgeon: Midge Minium, MD;  Location: [facility] ENDOSCOPY;   Service: Endoscopy;  Laterality: N/A;  . NO PAST SURGERIES      Prior to Admission medications   Medication Sig Start Date End Date Taking? Authorizing Provider  acamprosate (CAMPRAL) 333 MG tablet Take 2 tablets (666 mg total) by mouth 3 (three) times daily with meals. 03/24/17  Yes [name], [name], MD  albuterol (PROVENTIL HFA;VENTOLIN HFA) 108 (90 Base) MCG/ACT inhaler Inhale 2 puffs into the lungs every 6 (six) hours as needed for wheezing or shortness of breath. Patient taking differently: Inhale 2 puffs into the lungs every 4 (four) hours as needed for wheezing or shortness of breath.  10/08/16  Yes Shaune Pollack, MD  citalopram (CELEXA) 20 MG tablet Take 20 mg by mouth daily. 04/07/17  Yes [provider]  feeding supplement, ENSURE ENLIVE, (ENSURE ENLIVE) LIQD Take 237 mLs by mouth 2 (two) times daily between meals. 04/16/17  Yes Wieting, Richard, MD  folic acid (FOLVITE) 1 MG tablet Take 1 tablet (1 mg total) by mouth daily. 04/16/17  Yes Alford Highland, MD  levETIRAcetam (KEPPRA) 750 MG tablet Take 2 tablets (1,500 mg total) by mouth 2 (two) times daily. 12/11/16 04/05/18 Yes Merrily Brittle, MD  NON FORMULARY    Yes [provider]  ferrous sulfate 325 (65 FE) MG tablet Take 3 tablets (975 mg total) by mouth every Monday, Wednesday, and Friday. With breakfast Patient not taking: Reported on 04/26/2017 01/21/17   [name], NP  furosemide (LASIX) 20 MG tablet Take 20 mg by mouth daily.    [provider]  lactulose (CHRONULAC) 10 GM/15ML solution Take 45 mLs (30 g total) by mouth 4 (four) times daily. Patient not taking: Reported on 04/26/2017 04/16/17   Alford Highland, MD  losartan (COZAAR) 25 MG tablet Take 1 tablet (25 mg total) by mouth daily. Patient not taking: Reported on 04/26/2017 03/11/17   [name], NP  lurasidone (LATUDA) 80 MG TABS tablet Take 1 tablet (80 mg total) by mouth daily with supper. Patient not taking: Reported on 04/26/2017  03/24/17   [name], [name], MD  metFORMIN (GLUCOPHAGE) 500 MG tablet TAKE 1 TABLET BY MOUTH 2 TIMES DAILY WITH MEALS Patient not taking: Reported on 04/26/2017 03/26/17   [name], NP  nadolol (CORGARD) 20 MG tablet Take 1 tablet (20 mg total) by mouth daily. Patient not taking: Reported on 04/26/2017 03/11/17   [name], NP  omeprazole (PRILOSEC) 40 MG capsule Take 1 capsule (40 mg total) by mouth 2 (two) times daily. Patient not taking: Reported on 04/26/2017 04/16/17   Alford Highland, MD  rifaximin (XIFAXAN) 550 MG TABS tablet Take 1 tablet (550 mg total) by mouth 2 (two) times daily. Patient not taking: Reported on 04/26/2017 07/09/16   Katharina Caper, MD  spironolactone (ALDACTONE) 50 MG tablet Take 1 tablet (50 mg total) by mouth 2 (two) times daily. Patient not taking: Reported on 04/26/2017 03/02/17   [name], NP  sucralfate (CARAFATE) 1 g tablet Take 1 tablet by mouth 4 (four) times daily.    [provider]  thiamine (VITAMIN B-1) 100 MG tablet Take 100 mg by mouth daily.    [provider]  traZODone (DESYREL) 100 MG tablet Take 1 tablet (100 mg total) by mouth at bedtime as needed for sleep. Patient not taking: Reported on 04/26/2017 03/24/17   [name], [name], MD  Allergies Tramadol  Family History  Problem Relation Age of Onset  . Heart disease Mother   . Hypertension Mother   . Hyperlipidemia Mother   . Stroke Father   . Heart attack Father   . Hypertension Father   . Heart disease Father   . Alcohol abuse Father   . Heart disease Brother     Social History Social History  Substance Use Topics  . Smoking status: Never Smoker  . Smokeless tobacco: Never Used  . Alcohol use 0.0 oz/week     Comment: last drink 14 days ago    Review of Systems Constitutional: No fever/chills Eyes: No visual changes. ENT: No sore throat. Cardiovascular: Denies chest pain. Respiratory: Denies shortness of breath. Gastrointestinal: No  abdominal pain.  Positive nausea, positive vomiting.  No diarrhea.  No constipation. Genitourinary: Negative for dysuria. Musculoskeletal: Negative for back pain. Skin: Negative for rash. Neurological: Negative for headaches, focal weakness or numbness.   ____________________________________________   PHYSICAL EXAM:  VITAL SIGNS: ED Triage Vitals  Enc Vitals Group     BP 04/26/17 1652 (!) 108/57     Pulse Rate 04/26/17 1652 69     Resp 04/26/17 1652 16     Temp 04/26/17 1652 99.1 F (37.3 C)     Temp Source 04/26/17 1652 Oral     SpO2 04/26/17 1652 97 %     Weight --      Height --      Head Circumference --      Peak Flow --      Pain Score 04/26/17 1649 6     Pain Loc --      Pain Edu? --      Excl. in GC? --     Constitutional: Chronically ill-appearing mildly tremulous no diaphoresis no respiratory distress Eyes: PERRL EOMI. Head: Atraumatic. Nose: No congestion/rhinnorhea. Mouth/Throat: No trismus no tongue fasciculations Neck: No stridor.   Cardiovascular: Normal rate, regular rhythm. Grossly normal heart sounds.  Good peripheral circulation. Respiratory: Normal respiratory effort.  No retractions. Lungs CTAB and moving good air Gastrointestinal: Obese abdomen soft nontender no rebound or guarding no peritonitis Genitourinary exam: No external hemorrhoids family guaiac positive control positive brown stool Musculoskeletal: No lower extremity edema   Neurologic:  Normal speech and language. No gross focal neurologic deficits are appreciated. Skin:  Skin is warm, dry and intact. No rash noted. Psychiatric: Mood and affect are normal. Speech and behavior are normal.    ____________________________________________   DIFFERENTIAL includes but not limited to  Upper GI bleed, lower GI bleed, gastric ulcer, esophageal variceal rupture, pancreatitis ____________________________________________   LABS (all labs ordered are listed, but only abnormal results are  displayed)  Labs Reviewed  CBC WITH DIFFERENTIAL/PLATELET - Abnormal; Notable for the following:       Result Value   RBC 4.19 (*)    Hemoglobin 12.0 (*)    HCT 36.4 (*)    RDW 17.6 (*)    Platelets 134 (*)    All other components within normal limits  COMPREHENSIVE METABOLIC PANEL - Abnormal; Notable for the following:    Total Protein 8.6 (*)    AST 84 (*)    ALT 67 (*)    All other components within normal limits    Hemoglobin is stable __________________________________________  EKG  _______________________________________  RADIOLOGY  Chest x-ray with no acute disease no signs of pneumomediastinum ____________________________________________   PROCEDURES  Procedure(s) performed: no  Procedures  Critical Care performed: no  Observation: no ____________________________________________   INITIAL IMPRESSION / ASSESSMENT AND PLAN / ED COURSE  Pertinent labs & imaging results that were available during my care of the patient were reviewed by me and considered in my medical decision making (see chart for details).  The patient is hemodynamically stable and appears at his baseline. He does have faintly guaiac positive stool although it is brown. Doubt esophageal variceal rupture at this time.     The patient is chronically ill-appearing but is clearly at his baseline. He has family guaiac positive but his history of dark red blood and no melena are not consistent with esophageal variceal rupture. I've encouraged him to take his medications as prescribed and will give him primary care follow-up within the next week. He is discharged home with strict return precautions. ____________________________________________   FINAL CLINICAL IMPRESSION(S) / ED DIAGNOSES  Final diagnoses:  Hematemesis, presence of nausea not specified      NEW MEDICATIONS STARTED DURING THIS VISIT:  New Prescriptions   No medications on file     Note:  This document was prepared  using Dragon voice recognition software and may include unintentional dictation errors.     Merrily Brittleifenbark, Hildreth Robart, MD 04/27/17 [phone]

## 2017-04-26 NOTE — ED Triage Notes (Signed)
Pt to ED via POV, pt states that he started throwing up blood less than 1 hour ago. Pt states that he has had 5 episodes of blood in his vomit. Pt states that he also fell on his stomach.

## 2017-04-26 NOTE — ED Triage Notes (Signed)
Mother to stat desk asking about wait time. Mother updated about wait time.

## 2017-04-27 NOTE — ED Notes (Signed)
Reviewed d/c instructions and follow-up care with patient/family. Pt/family verbalized understanding.

## 2017-04-27 NOTE — Discharge Instructions (Signed)
Fortunately today your blood work and chest x-ray were reassuring although you do have a small amount of blood in your stool. Please take all of your home medications as prescribed including your omeprazole and make an appointment to see her primary care provider tomorrow for reexamination. Return to the emergency department for any concerns such as fevers, chills, if you cannot eat or drink, if you vomit blood again, or for any other concerns whatsoever.  It was a pleasure to take care of you today, and thank you for coming to our emergency department.  If you have any questions or concerns before leaving please ask the nurse to grab me and I'm more than happy to go through your aftercare instructions again.  If you were prescribed any opioid pain medication today such as Norco, Vicodin, Percocet, morphine, hydrocodone, or oxycodone please make sure you do not drive when you are taking this medication as it can alter your ability to drive safely.  If you have any concerns once you are home that you are not improving or are in fact getting worse before you can make it to your follow-up appointment, please do not hesitate to call [phone] and come back for further evaluation.  Merrily BrittleNeil Angela Platner, MD  Results for orders placed or performed during the hospital encounter of 04/26/17  CBC with Differential  Result Value Ref Range   WBC 7.6 3.8 - 10.6 K/uL   RBC 4.19 (L) 4.40 - 5.90 MIL/uL   Hemoglobin 12.0 (L) 13.0 - 18.0 g/dL   HCT 16.136.4 (L) 09.640.0 - 04.552.0 %   MCV 86.8 80.0 - 100.0 fL   MCH 28.8 26.0 - 34.0 pg   MCHC 33.1 32.0 - 36.0 g/dL   RDW 40.917.6 (H) 81.111.5 - 91.414.5 %   Platelets 134 (L) 150 - 440 K/uL   Neutrophils Relative % 67 %   Neutro Abs 5.1 1.4 - 6.5 K/uL   Lymphocytes Relative 19 %   Lymphs Abs 1.5 1.0 - 3.6 K/uL   Monocytes Relative 10 %   Monocytes Absolute 0.8 0.2 - 1.0 K/uL   Eosinophils Relative 3 %   Eosinophils Absolute 0.2 0 - 0.7 K/uL   Basophils Relative 1 %   Basophils Absolute 0.0 0  - 0.1 K/uL  Comprehensive metabolic panel  Result Value Ref Range   Sodium 138 135 - 145 mmol/L   Potassium 4.5 3.5 - 5.1 mmol/L   Chloride 104 101 - 111 mmol/L   CO2 26 22 - 32 mmol/L   Glucose, Bld 85 65 - 99 mg/dL   BUN 14 6 - 20 mg/dL   Creatinine, Ser 7.820.88 0.61 - 1.24 mg/dL   Calcium 9.8 8.9 - 95.610.3 mg/dL   Total Protein 8.6 (H) 6.5 - 8.1 g/dL   Albumin 4.0 3.5 - 5.0 g/dL   AST 84 (H) 15 - 41 U/L   ALT 67 (H) 17 - 63 U/L   Alkaline Phosphatase 104 38 - 126 U/L   Total Bilirubin 0.9 0.3 - 1.2 mg/dL   GFR calc non Af Amer >60 >60 mL/min   GFR calc Af Amer >60 >60 mL/min   Anion gap 8 5 - 15   Dg Chest 2 View  Result Date: 04/17/2017 CLINICAL DATA:  Chest pain earlier today. Shortness of breath for 1 year. Syncope. EXAM: CHEST  2 VIEW COMPARISON:  04/03/2017 FINDINGS: Linear scarring in the left lung base is unchanged since previous study. No airspace disease or consolidation in the lungs. Normal heart  size and pulmonary vascularity. No blunting of costophrenic angles. No pneumothorax. IMPRESSION: Scarring in the left lung base. No evidence of active pulmonary disease. Electronically Signed   By: Burman Nieves M.D.   On: 04/17/2017 21:44   Dg Chest 2 View  Result Date: 04/03/2017 CLINICAL DATA:  Chest pain. EXAM: CHEST  2 VIEW COMPARISON:  01/18/2017 chest radiograph FINDINGS: Stable heart size and mediastinal contours are within normal limits. Linear opacities at left lung base compatible with minor atelectasis. Pulmonary venous hypertension. No consolidation, effusion, or pneumothorax. Stable mild anterior loss of height of T12 vertebral body. No acute osseous abnormality is evident. IMPRESSION: Pulmonary venous hypertension and minor left lung base atelectasis. Electronically Signed   By: Mitzi Hansen M.D.   On: 04/03/2017 18:48   Ct Head Wo Contrast  Result Date: 04/22/2017 CLINICAL DATA:  Fall with head injury. Patient was seen in the ED earlier today. Now is back  with a fall. Vomiting. EXAM: CT HEAD WITHOUT CONTRAST CT CERVICAL SPINE WITHOUT CONTRAST TECHNIQUE: Multidetector CT imaging of the head and cervical spine was performed following the standard protocol without intravenous contrast. Multiplanar CT image reconstructions of the cervical spine were also generated. COMPARISON:  CT head 04/17/2017.  CT head 03/25/2017. FINDINGS: CT HEAD FINDINGS Brain: No evidence of acute infarction, hemorrhage, hydrocephalus, extra-axial collection or mass lesion/mass effect. Vascular: No hyperdense vessel or unexpected calcification. Skull: Normal. Negative for fracture or focal lesion. Sinuses/Orbits: No acute finding. Other: None. CT CERVICAL SPINE FINDINGS Alignment: Straightening of the usual cervical lordosis. This may be due to patient positioning but ligamentous injury or muscle spasm could also have this appearance. No anterior subluxation. Normal alignment of the facet joints. C1-2 articulation appears intact. Skull base and vertebrae: No acute fracture. No primary bone lesion or focal pathologic process. Soft tissues and spinal canal: No prevertebral fluid or swelling. No visible canal hematoma. Disc levels:  Intervertebral disc space heights are preserved. Upper chest: Motion artifact. Hypodense left thyroid nodule measuring 2.2 cm diameter. No acute process identified. Other: None. IMPRESSION: 1. No acute intracranial abnormalities. 2. Nonspecific straightening of usual cervical lordosis. No acute displaced fractures identified. Electronically Signed   By: Burman Nieves M.D.   On: 04/22/2017 00:18   Ct Head Wo Contrast  Result Date: 04/17/2017 CLINICAL DATA:  Syncope and fall. EXAM: CT HEAD WITHOUT CONTRAST TECHNIQUE: Contiguous axial images were obtained from the base of the skull through the vertex without intravenous contrast. COMPARISON:  CT scan of March 25, 2017. FINDINGS: Brain: No evidence of acute infarction, hemorrhage, hydrocephalus, extra-axial collection  or mass lesion/mass effect. Vascular: No hyperdense vessel or unexpected calcification. Skull: Normal. Negative for fracture or focal lesion. Sinuses/Orbits: No acute finding. Other: None. IMPRESSION: Normal head CT. Electronically Signed   By: Lupita Raider, M.D.   On: 04/17/2017 21:34   Ct Cervical Spine Wo Contrast  Result Date: 04/22/2017 CLINICAL DATA:  Fall with head injury. Patient was seen in the ED earlier today. Now is back with a fall. Vomiting. EXAM: CT HEAD WITHOUT CONTRAST CT CERVICAL SPINE WITHOUT CONTRAST TECHNIQUE: Multidetector CT imaging of the head and cervical spine was performed following the standard protocol without intravenous contrast. Multiplanar CT image reconstructions of the cervical spine were also generated. COMPARISON:  CT head 04/17/2017.  CT head 03/25/2017. FINDINGS: CT HEAD FINDINGS Brain: No evidence of acute infarction, hemorrhage, hydrocephalus, extra-axial collection or mass lesion/mass effect. Vascular: No hyperdense vessel or unexpected calcification. Skull: Normal. Negative for fracture or focal  lesion. Sinuses/Orbits: No acute finding. Other: None. CT CERVICAL SPINE FINDINGS Alignment: Straightening of the usual cervical lordosis. This may be due to patient positioning but ligamentous injury or muscle spasm could also have this appearance. No anterior subluxation. Normal alignment of the facet joints. C1-2 articulation appears intact. Skull base and vertebrae: No acute fracture. No primary bone lesion or focal pathologic process. Soft tissues and spinal canal: No prevertebral fluid or swelling. No visible canal hematoma. Disc levels:  Intervertebral disc space heights are preserved. Upper chest: Motion artifact. Hypodense left thyroid nodule measuring 2.2 cm diameter. No acute process identified. Other: None. IMPRESSION: 1. No acute intracranial abnormalities. 2. Nonspecific straightening of usual cervical lordosis. No acute displaced fractures identified.  Electronically Signed   By: Burman Nieves M.D.   On: 04/22/2017 00:18   Ct Abdomen Pelvis W Contrast  Result Date: 04/22/2017 CLINICAL DATA:  Initial evaluation for acute trauma, fall. EXAM: CT ABDOMEN AND PELVIS WITH CONTRAST TECHNIQUE: Multidetector CT imaging of the abdomen and pelvis was performed using the standard protocol following bolus administration of intravenous contrast. CONTRAST:  ISOVUE-300 IOPAMIDOL (ISOVUE-300) INJECTION 61% COMPARISON:  Prior CT from 03/25/2017. FINDINGS: Lower chest: Mild scattered atelectatic changes present within the visualized lung bases. Visualized lungs are otherwise clear. Hepatobiliary: Changes consistent with cirrhosis present within the liver. No focal intrahepatic mass. Gallbladder largely contracted. Mild wall thickening likely related incomplete distension. Gallbladder otherwise unremarkable. No biliary dilatation. Pancreas: Pancreas within normal limits. Spleen: Spleen is enlarged measuring 18 cm in craniocaudad dimension, like related underlying portal hypertension. Spleen otherwise unremarkable. Adrenals/Urinary Tract: Adrenal glands within normal limits. Kidneys equal in size with symmetric enhancement. No nephrolithiasis, hydronephrosis, or focal enhancing renal mass. No hydroureter. Bladder largely decompressed without acute abnormality. Stomach/Bowel: Stomach within normal limits. No evidence for bowel obstruction. Appendix normal. No acute inflammatory changes seen about the bowels. Vascular/Lymphatic: Normal intravascular enhancement seen throughout the intra-abdominal aorta in its branch vessels. Multiple prominent varices present within the upper abdomen, consistent with portal hypertension. Shotty subcentimeter retroperitoneal lymph nodes are stable. No pathologically enlarged intra-abdominal or pelvic lymph nodes identified. Reproductive: Prostate normal. Other: No free air or fluid. No ascites. No mesenteric or retroperitoneal hematoma. Hazy  stranding within the mesenteric fat similar to previous, likely related underlying intrinsic liver disease. Musculoskeletal: Stable compression deformity involving the T7 and T11 vertebral bodies. No acute osseous abnormality. IMPRESSION: 1. No acute intra-abdominal or pelvic process identified. 2. Cirrhosis with evidence for portal hypertension, stable from previous. 3. Stable compression deformities involving the T7 and T11 vertebral bodies. Electronically Signed   By: Rise Mu M.D.   On: 04/22/2017 00:34   US Abdomen Limited  Result Date: 04/15/2017 CLINICAL DATA:  36 year old male with a history of cirrhosis and recurrent ascites. EXAM: LIMITED ABDOMEN ULTRASOUND FOR ASCITES TECHNIQUE: Limited ultrasound survey for ascites was performed in all four abdominal quadrants. COMPARISON:  Most recent ultrasound study 12/19/2016. FINDINGS: Limited ultrasound of the abdomen demonstrates no ascites. IMPRESSION: Limited ultrasound demonstrates no ascites within the abdomen. Electronically Signed   By: Gilmer Mor D.O.   On: 04/15/2017 10:29   Dg Chest Port 1 View  Result Date: 04/26/2017 CLINICAL DATA:  Initial evaluation for hematemesis. EXAM: PORTABLE CHEST 1 VIEW COMPARISON:  Prior radiograph from 04/17/2017. FINDINGS: Mild cardiomegaly, stable.  Mediastinal silhouette normal. Lungs mildly hypoinflated. Mild left basilar scarring, unchanged. No focal infiltrates. No pulmonary edema or pleural effusion. No pneumothorax. IMPRESSION: 1. Mild left basilar scarring, stable. 2. No other active cardiopulmonary  disease. Electronically Signed   By: Rise Mu M.D.   On: 04/26/2017 23:55

## 2017-04-28 ENCOUNTER — Ambulatory Visit (INDEPENDENT_AMBULATORY_CARE_PROVIDER_SITE_OTHER): Payer: Medicare Other | Admitting: Licensed Clinical Social Worker

## 2017-04-28 ENCOUNTER — Other Ambulatory Visit: Payer: Self-pay | Admitting: Nurse Practitioner

## 2017-04-28 DIAGNOSIS — G44009 Cluster headache syndrome, unspecified, not intractable: Secondary | ICD-10-CM | POA: Insufficient documentation

## 2017-04-28 DIAGNOSIS — E119 Type 2 diabetes mellitus without complications: Secondary | ICD-10-CM | POA: Insufficient documentation

## 2017-04-28 DIAGNOSIS — F1010 Alcohol abuse, uncomplicated: Secondary | ICD-10-CM | POA: Diagnosis not present

## 2017-04-28 DIAGNOSIS — I10 Essential (primary) hypertension: Secondary | ICD-10-CM | POA: Diagnosis not present

## 2017-04-28 DIAGNOSIS — R51 Headache: Secondary | ICD-10-CM | POA: Diagnosis present

## 2017-04-28 DIAGNOSIS — Z9114 Patient's other noncompliance with medication regimen: Secondary | ICD-10-CM | POA: Diagnosis not present

## 2017-04-28 DIAGNOSIS — F200 Paranoid schizophrenia: Secondary | ICD-10-CM

## 2017-04-28 DIAGNOSIS — Z7984 Long term (current) use of oral hypoglycemic drugs: Secondary | ICD-10-CM | POA: Diagnosis not present

## 2017-04-28 DIAGNOSIS — R569 Unspecified convulsions: Secondary | ICD-10-CM

## 2017-04-28 DIAGNOSIS — M79644 Pain in right finger(s): Secondary | ICD-10-CM | POA: Insufficient documentation

## 2017-04-28 NOTE — Progress Notes (Signed)
Pt needs repeat levetiracetam level to ensure at safe dosing.  Repeat not done since 7/31
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
## 2017-04-28 NOTE — ED Triage Notes (Signed)
Pt here for headache has hx of migraines and states feels the same.

## 2017-04-28 NOTE — Telephone Encounter (Signed)
Contacted Wilhelmina McardleLauren [name] NP to inform of levitricam level elevated on 7/31--she will follow up.

## 2017-04-29 ENCOUNTER — Emergency Department
Admission: EM | Admit: 2017-04-29 | Discharge: 2017-04-29 | Disposition: A | Discharge status: Home / Self Care | Payer: Medicare Other | Attending: Student in an Organized Health Care Education/Training Program | Admitting: Student in an Organized Health Care Education/Training Program

## 2017-04-29 ENCOUNTER — Emergency Department: Payer: Medicare Other

## 2017-04-29 DIAGNOSIS — R51 Headache: Secondary | ICD-10-CM

## 2017-04-29 DIAGNOSIS — R519 Headache, unspecified: Secondary | ICD-10-CM

## 2017-04-29 DIAGNOSIS — M79644 Pain in right finger(s): Secondary | ICD-10-CM

## 2017-04-29 MED ORDER — LACTULOSE 10 GM/15ML PO SOLN
30.0000 g | Freq: Four times a day (QID) | ORAL | Status: DC
  Administered 2017-04-29: 30 g via ORAL
  Filled 2017-04-29: qty 60

## 2017-04-29 MED ORDER — PROCHLORPERAZINE EDISYLATE 5 MG/ML IJ SOLN
10.0000 mg | Freq: Once | INTRAMUSCULAR | Status: AC
  Administered 2017-04-29: 10 mg via INTRAMUSCULAR

## 2017-04-29 MED ORDER — PROCHLORPERAZINE EDISYLATE 5 MG/ML IJ SOLN
10.0000 mg | Freq: Once | INTRAMUSCULAR | Status: DC
  Filled 2017-04-29: qty 2

## 2017-04-29 MED ORDER — LEVETIRACETAM 500 MG PO TABS
1500.0000 mg | ORAL_TABLET | Freq: Two times a day (BID) | ORAL | Status: DC
  Administered 2017-04-29: 1500 mg via ORAL
  Filled 2017-04-29: qty 3

## 2017-04-29 MED ORDER — FOLIC ACID 1 MG PO TABS
1.0000 mg | ORAL_TABLET | Freq: Every day | ORAL | Status: DC
  Administered 2017-04-29: 1 mg via ORAL
  Filled 2017-04-29: qty 1

## 2017-04-29 NOTE — ED Provider Notes (Signed)
[facility] Emergency Department Provider Note    First MD Initiated Contact with Patient 04/29/17 [phone]     (approximate)  I have reviewed the triage vital signs and the nursing notes.   HISTORY  Chief Complaint Migraine    HPI [place] A Choquette is a 36 y.o. male who is well-known to this facility presents with chief complaint of headache that he states has been on and off throughout the day. Has not taken anything to alleviate this headache. States he's had similar headaches in the past. Denies any numbness or tingling. No falls. Is also complaining of right finger pain. States he thinks that he broke it. Patient has a long history of noncompliance with his home medications. States he has not had anything to drink alcohol while is in several days. Denies any fevers. This is not the worse headache of his life. No blurry vision.   Past Medical History:  Diagnosis Date  . Alcoholic cirrhosis of liver with ascites (HCC)   . Alcoholism (HCC)   . Anemia   . Atrial fibrillation (HCC)   . COPD (chronic obstructive pulmonary disease) (HCC)   . Depression   . Diabetes mellitus, type II (HCC)   . Esophageal varices (HCC)   . Heart disease    irregular heart beat (palpitations) and heart murmur  . Hyperlipemia   . Hypertension   . Liver disease   . Multiple thyroid nodules   . Portal hypertensive gastropathy (HCC)   . Schizophrenia (HCC)   . Seizures (HCC)    Family History  Problem Relation Age of Onset  . Heart disease Mother   . Hypertension Mother   . Hyperlipidemia Mother   . Stroke Father   . Heart attack Father   . Hypertension Father   . Heart disease Father   . Alcohol abuse Father   . Heart disease Brother    Past Surgical History:  Procedure Laterality Date  . ESOPHAGOGASTRODUODENOSCOPY N/A 10/05/2015   Procedure: ESOPHAGOGASTRODUODENOSCOPY (EGD);  Surgeon: Elnita Maxwell, MD;  Location[facility] ENDOSCOPY;  Service: Endoscopy;   Laterality: N/A;  . ESOPHAGOGASTRODUODENOSCOPY (EGD) WITH PROPOFOL N/A 08/03/2015   Procedure: ESOPHAGOGASTRODUODENOSCOPY (EGD) WITH PROPOFOL;  Surgeon: Elnita Maxwell, MD;  Location: [facility] ENDOSCOPY;  Service: Endoscopy;  Laterality: N/A;  . ESOPHAGOGASTRODUODENOSCOPY (EGD) WITH PROPOFOL N/A 08/31/2015   Procedure: ESOPHAGOGASTRODUODENOSCOPY (EGD) WITH PROPOFOL;  Surgeon: Elnita Maxwell, MD;  Location: [facility] ENDOSCOPY;  Service: Endoscopy;  Laterality: N/A;  . ESOPHAGOGASTRODUODENOSCOPY (EGD) WITH PROPOFOL N/A 04/04/2016   Procedure: ESOPHAGOGASTRODUODENOSCOPY (EGD) WITH PROPOFOL;  Surgeon: Scot Jun, MD;  Location: [facility] ENDOSCOPY;  Service: Endoscopy;  Laterality: N/A;  . ESOPHAGOGASTRODUODENOSCOPY (EGD) WITH PROPOFOL N/A 11/13/2016   Procedure: ESOPHAGOGASTRODUODENOSCOPY (EGD) WITH PROPOFOL;  Surgeon: Wyline Mood, MD;  Location: [facility] ENDOSCOPY;  Service: Endoscopy;  Laterality: N/A;  . ESOPHAGOGASTRODUODENOSCOPY (EGD) WITH PROPOFOL N/A 04/07/2017   Procedure: ESOPHAGOGASTRODUODENOSCOPY (EGD) WITH PROPOFOL;  Surgeon: Midge Minium, MD;  Location: [facility] ENDOSCOPY;  Service: Endoscopy;  Laterality: N/A;  . NO PAST SURGERIES     Patient Active Problem List   Diagnosis Date Noted  . End stage liver disease (HCC) 04/24/2017  . Esophageal varices without bleeding (HCC)   . Hematemesis 04/05/2017  . Upper GI bleed   . Iron deficiency anemia due to chronic blood loss 02/16/2017  . Nausea 01/18/2017  . Respiratory failure with hypoxia (HCC) 12/14/2016  . Seizures (HCC) 12/12/2016  . DNR (do not resuscitate) discussion 08/11/2016  . Muscle weakness (generalized)   .  GI bleed 08/07/2016  . OSA (obstructive sleep apnea) 04/29/2016  . Anemia 04/29/2016  . Thrombocytopenia (HCC) 04/29/2016  . Coagulopathy (HCC) 04/29/2016  . Obesity 04/29/2016  . Controlled type 2 diabetes mellitus without complication (HCC) 01/15/2016  . Essential (primary) hypertension 12/11/2015  . Pure  hypercholesterolemia 12/11/2015  . Hepatic encephalopathy (HCC) 10/16/2015  . 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 Elevated transaminase level 07/04/2015  . Alcohol abuse 05/31/2015  . Paranoid schizophrenia (HCC) 03/20/2015      Prior to Admission medications   Medication Sig Start Date End Date Taking? Authorizing Provider  acamprosate (CAMPRAL) 333 MG tablet Take 2 tablets (666 mg total) by mouth 3 (three) times daily with meals. 03/24/17   [name], Jackquline DenmarkJohn T, MD  albuterol (PROVENTIL HFA;VENTOLIN HFA) 108 (90 Base) MCG/ACT inhaler Inhale 2 puffs into the lungs every 6 (six) hours as needed for wheezing or shortness of breath. Patient taking differently: Inhale 2 puffs into the lungs every 4 (four) hours as needed for wheezing or shortness of breath.  10/08/16   Shaune Pollackhen, Qing, MD  citalopram (CELEXA) 20 MG tablet Take 20 mg by mouth daily. 04/07/17   [provider]  feeding supplement, ENSURE ENLIVE, (ENSURE ENLIVE) LIQD Take 237 mLs by mouth 2 (two) times daily between meals. 04/16/17   Alford HighlandWieting, Richard, MD  ferrous sulfate 325 (65 FE) MG tablet Take 3 tablets (975 mg total) by mouth every Monday, Wednesday, and Friday. With breakfast Patient not taking: Reported on 04/26/2017 01/21/17   Galen ManilaKennedy, [name], NP  folic acid (FOLVITE) 1 MG tablet Take 1 tablet (1 mg total) by mouth daily. 04/16/17   Alford HighlandWieting, Richard, MD  furosemide (LASIX) 20 MG tablet Take 20 mg by mouth daily.    [provider]  lactulose (CHRONULAC) 10 GM/15ML solution Take 45 mLs (30 g total) by mouth 4 (four) times daily. Patient not taking: Reported on 04/26/2017 04/16/17   Alford HighlandWieting, Richard, MD  levETIRAcetam (KEPPRA) 750 MG tablet Take 2 tablets (1,500 mg total) by mouth 2 (two) times daily. 12/11/16 04/05/18  Merrily Brittleifenbark, Neil, MD  losartan (COZAAR) 25 MG tablet Take 1 tablet (25 mg total) by mouth daily. Patient not taking: Reported on 04/26/2017 03/11/17   Galen ManilaKennedy, [name], NP    lurasidone (LATUDA) 80 MG TABS tablet Take 1 tablet (80 mg total) by mouth daily with supper. Patient not taking: Reported on 04/26/2017 03/24/17   [name], Jackquline DenmarkJohn T, MD  metFORMIN (GLUCOPHAGE) 500 MG tablet TAKE 1 TABLET BY MOUTH 2 TIMES DAILY WITH MEALS Patient not taking: Reported on 04/26/2017 03/26/17   Galen ManilaKennedy, [name], NP  nadolol (CORGARD) 20 MG tablet Take 1 tablet (20 mg total) by mouth daily. Patient not taking: Reported on 04/26/2017 03/11/17   Galen ManilaKennedy, [name], NP  [facility]     [provider]  omeprazole (PRILOSEC) 40 MG capsule Take 1 capsule (40 mg total) by mouth 2 (two) times daily. Patient not taking: Reported on 04/26/2017 04/16/17   Alford HighlandWieting, Richard, MD  rifaximin (XIFAXAN) 550 MG TABS tablet Take 1 tablet (550 mg total) by mouth 2 (two) times daily. Patient not taking: Reported on 04/26/2017 07/09/16   Katharina CaperVaickute, Rima, MD  spironolactone (ALDACTONE) 50 MG tablet Take 1 tablet (50 mg total) by mouth 2 (two) times daily. Patient not taking: Reported on 04/26/2017 03/02/17   Galen ManilaKennedy, [name], NP  sucralfate (CARAFATE) 1 g tablet Take 1 tablet by mouth 4 (four) times daily.    [provider]  thiamine (VITAMIN B-1) 100 MG tablet Take 100 mg by mouth daily.    [provider]  traZODone (DESYREL) 100 MG tablet Take 1 tablet (100 mg total) by mouth at bedtime as needed for sleep. Patient not taking: Reported on 04/26/2017 03/24/17   [name], [name], MD    Allergies Tramadol    Social History Social History  Substance Use Topics  . Smoking status: Never Smoker  . Smokeless tobacco: Never Used  . Alcohol use 0.0 oz/week     Comment: last drink 14 days ago    Review of Systems Patient denies headaches, rhinorrhea, blurry vision, numbness, shortness of breath, chest pain, edema, cough, abdominal pain, nausea, vomiting, diarrhea, dysuria, fevers, rashes or hallucinations unless otherwise stated above in  HPI. ____________________________________________   PHYSICAL EXAM:  VITAL SIGNS: Vitals:   04/29/17 0048 04/29/17 0130  BP: 119/81 119/60  Pulse: 66 63  Resp: 18   Temp:      Constitutional: Alert and oriented. Ambulated into the ER room, smiling andin no acute distress. Eyes: Conjunctivae are normal.  Head: Atraumatic. Nose: No congestion/rhinnorhea. Mouth/Throat: Mucous membranes are moist.   Neck: No stridor. Painless ROM.  Cardiovascular: Normal rate, regular rhythm. Grossly normal heart sounds.  Good peripheral circulation. Respiratory: Normal respiratory effort.  No retractions. Lungs CTAB. Gastrointestinal: Soft and nontender. No distention. No abdominal bruits. No CVA tenderness. Musculoskeletal: No lower extremity tenderness nor edema.  No joint effusions. Neurologic:  Normal speech and language. No gross focal neurologic deficits are appreciated. No facial droop. No tongue fasciculations Skin:  Skin is warm, dry and intact. No rash noted. Psychiatric: Mood and affect are normal. Speech and behavior are normal.  ____________________________________________   LABS (all labs ordered are listed, but only abnormal results are displayed)  No results found for this or any previous visit (from the past 24 hour(s)). ____________________________________________ ______________________________  RADIOLOGY   ____________________________________________   PROCEDURES  Procedure(s) performed:  Procedures    Critical Care performed: no ____________________________________________   INITIAL IMPRESSION / ASSESSMENT AND PLAN / ED COURSE  Pertinent labs & imaging results that were available during my care of the patient were reviewed by me and considered in my medical decision making (see chart for details).  DDX: migraine, tension, cluster, fracutre, unlikley meningitis, sah, ich  Jakaden A Munford is a 36 y.o. who presents to the ED with headache as described above.  Headache is described as mild. Not sudden onset and not the worse headache of his life. Patient does have history of migraines and feels this is similar. There is no evidence of trauma and no focal neuro deficits to indicate need for CT imaging at this point. Patient given Compazine with improvement in symptoms. No fevers to suggest infectious process. Patient was also concern about pain in the right finger. No evidence of infectious process there. X-ray shows no evidence of fracture. As patient is currently at his baseline with no other complaints, is well-appearing and in good spirits, I do feel the patient is appropriate for discharge home.      ____________________________________________   FINAL CLINICAL IMPRESSION(S) / ED DIAGNOSES  Final diagnoses:  Acute nonintractable headache, unspecified headache type  Finger pain, right      NEW MEDICATIONS STARTED DURING THIS VISIT:  Discharge Medication List as of 04/29/2017  1:46 AM       Note:  This document was prepared using Dragon voice recognition software and may include unintentional dictation errors.    Roxan Hockey,  Luisa Hart, MD 04/29/17 8295

## 2017-04-29 NOTE — Discharge Instructions (Signed)
As discussed in the emergency department, you may use Tylenol  for headaches. These are "Over the Counter" medications and can be found at most drug stores and grocery stores. Please use the recommended dosing instructions on the bottle/box. Do not exceed the maximum dose for either medications. Please be sure to rest and drink plenty of fluids. Please be sure to call your PCP for a follow-up visit, especially if your headaches persist.  Please call your physician or return to ED if you have: 1. Worsening or change in headaches. 2. Changes in vision. 3. New-onset nausea and vomiting. 4. Numbness, tingling, weakness in your extremities,. 4. Inability to eat or drink adequate amounts of food or liquids. 5. Chest pain, shortness of breath, or difficulty breathing. 6. Neurological changes- dizziness, fainting, loss of function of your arms, legs or other parts of your body. 7. Uncontrolled hypertension. 8. Or any other emergent concerns.

## 2017-05-03 ENCOUNTER — Emergency Department: Payer: Medicare Other

## 2017-05-03 ENCOUNTER — Emergency Department
Admission: EM | Admit: 2017-05-03 | Discharge: 2017-05-03 | Disposition: A | Discharge status: Home / Self Care | Payer: Medicare Other | Attending: Emergency Medicine | Admitting: Emergency Medicine

## 2017-05-03 DIAGNOSIS — E119 Type 2 diabetes mellitus without complications: Secondary | ICD-10-CM | POA: Diagnosis not present

## 2017-05-03 DIAGNOSIS — K920 Hematemesis: Secondary | ICD-10-CM | POA: Insufficient documentation

## 2017-05-03 DIAGNOSIS — J449 Chronic obstructive pulmonary disease, unspecified: Secondary | ICD-10-CM | POA: Insufficient documentation

## 2017-05-03 DIAGNOSIS — I10 Essential (primary) hypertension: Secondary | ICD-10-CM | POA: Diagnosis not present

## 2017-05-03 DIAGNOSIS — Z7984 Long term (current) use of oral hypoglycemic drugs: Secondary | ICD-10-CM | POA: Insufficient documentation

## 2017-05-03 DIAGNOSIS — Z79899 Other long term (current) drug therapy: Secondary | ICD-10-CM | POA: Insufficient documentation

## 2017-05-03 DIAGNOSIS — I4891 Unspecified atrial fibrillation: Secondary | ICD-10-CM | POA: Diagnosis not present

## 2017-05-03 LAB — CBC
HEMATOCRIT: 35.3 % — AB (ref 40.0–52.0)
Hemoglobin: 12 g/dL — ABNORMAL LOW (ref 13.0–18.0)
MCH: 29.7 pg (ref 26.0–34.0)
MCHC: 34 g/dL (ref 32.0–36.0)
MCV: 87.4 fL (ref 80.0–100.0)
Platelets: 189 10*3/uL (ref 150–440)
RBC: 4.04 MIL/uL — ABNORMAL LOW (ref 4.40–5.90)
RDW: 17.8 % — AB (ref 11.5–14.5)
WBC: 7.2 10*3/uL (ref 3.8–10.6)

## 2017-05-03 LAB — COMPREHENSIVE METABOLIC PANEL
ALBUMIN: 4 g/dL (ref 3.5–5.0)
ALT: 61 U/L (ref 17–63)
AST: 66 U/L — AB (ref 15–41)
Alkaline Phosphatase: 120 U/L (ref 38–126)
Anion gap: 8 (ref 5–15)
BUN: 14 mg/dL (ref 6–20)
CHLORIDE: 103 mmol/L (ref 101–111)
CO2: 25 mmol/L (ref 22–32)
Calcium: 9.2 mg/dL (ref 8.9–10.3)
Creatinine, Ser: 0.94 mg/dL (ref 0.61–1.24)
GFR calc Af Amer: >60 mL/min (ref >60)
Glucose, Bld: 97 mg/dL (ref 65–99)
POTASSIUM: 4.6 mmol/L (ref 3.5–5.1)
Sodium: 136 mmol/L (ref 135–145)
Total Bilirubin: 0.9 mg/dL (ref 0.3–1.2)
Total Protein: 8.4 g/dL — ABNORMAL HIGH (ref 6.5–8.1)

## 2017-05-03 LAB — TYPE AND SCREEN
ABO/RH(D): O POS
Antibody Screen: NEGATIVE

## 2017-05-03 NOTE — ED Triage Notes (Signed)
Pt reports 2 episodes of vomiting blood today, pt reports his stomach feels uncomfortable at this time, hospice nurse called in to report that the pt was coming in and states that she saw him today, he took his compazine without relief, no lactulose yesterday, and pt reported some beer last night

## 2017-05-03 NOTE — ED Provider Notes (Signed)
[facility] Emergency Department Provider Note  ____________________________________________   First MD Initiated Contact with Patient 05/03/17 1953     (approximate)  I have reviewed the triage vital signs and the nursing notes.   HISTORY  Chief Complaint Hematemesis    HPI Howard [name] is a 36 y.o. male who presents to the emergency department with his mother after having 2 episodes of emesis this morning with a scant amount of dark blood in them. He is a complex past medical history including schizoaffective disorder as well as alcoholic cirrhosis. He reports that he is continuing to drink alcohol although he does report compliance with his medications. He said this morning he felt nauseated retched and vomited several times at the end of the final vomit he had a small amount of blood. It was not bright red. He's had no change in his stools. He has not vomited since. His symptoms seemed to get worse with vomiting and her improvement but not vomiting.  04/07/17 EGD: - Grade I esophageal varices. - Gastritis. - Normal examined duodenum.   Past Medical History:  Diagnosis Date  . Alcoholic cirrhosis of liver with ascites (HCC)   . Alcoholism (HCC)   . Anemia   . Atrial fibrillation (HCC)   . COPD (chronic obstructive pulmonary disease) (HCC)   . Depression   . Diabetes mellitus, type II (HCC)   . Esophageal varices (HCC)   . Heart disease    irregular heart beat (palpitations) and heart murmur  . Hyperlipemia   . Hypertension   . Liver disease   . Multiple thyroid nodules   . Portal hypertensive gastropathy (HCC)   . Schizophrenia (HCC)   . Seizures [facility])     Patient Active Problem List   Diagnosis Date Noted  . End stage liver disease (HCC) 04/24/2017  . Esophageal varices without bleeding (HCC)   . Hematemesis 04/05/2017  . Upper GI bleed   . Iron deficiency anemia due to chronic blood loss 02/16/2017  . Nausea 01/18/2017  .  Respiratory failure with hypoxia (HCC) 12/14/2016  . Seizures (HCC) 12/12/2016  . DNR (do not resuscitate) discussion 08/11/2016  . Muscle weakness (generalized)   . GI bleed 08/07/2016  . OSA (obstructive sleep apnea) 04/29/2016  . Anemia 04/29/2016  . Thrombocytopenia (HCC) 04/29/2016  . Coagulopathy (HCC) 04/29/2016  . Obesity 04/29/2016  . Controlled type 2 diabetes mellitus without complication (HCC) 01/15/2016  . Essential (primary) hypertension 12/11/2015  . Pure hypercholesterolemia 12/11/2015  . Hepatic encephalopathy (HCC) 10/16/2015  . 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 Elevated transaminase level 07/04/2015  . Alcohol abuse 05/31/2015  . Paranoid schizophrenia (HCC) 03/20/2015    Past Surgical History:  Procedure Laterality Date  . ESOPHAGOGASTRODUODENOSCOPY N/A 10/05/2015   Procedure: ESOPHAGOGASTRODUODENOSCOPY (EGD);  Surgeon: Elnita Maxwell, MD;  Location: [facility] ENDOSCOPY;  Service: Endoscopy;  Laterality: N/A;  . ESOPHAGOGASTRODUODENOSCOPY (EGD) WITH PROPOFOL N/A 08/03/2015   Procedure: ESOPHAGOGASTRODUODENOSCOPY (EGD) WITH PROPOFOL;  Surgeon: Elnita Maxwell, MD;  Location: [facility] ENDOSCOPY;  Service: Endoscopy;  Laterality: N/A;  . ESOPHAGOGASTRODUODENOSCOPY (EGD) WITH PROPOFOL N/A 08/31/2015   Procedure: ESOPHAGOGASTRODUODENOSCOPY (EGD) WITH PROPOFOL;  Surgeon: Elnita Maxwell, MD;  Location: [facility] ENDOSCOPY;  Service: Endoscopy;  Laterality: N/A;  . ESOPHAGOGASTRODUODENOSCOPY (EGD) WITH PROPOFOL N/A 04/04/2016   Procedure: ESOPHAGOGASTRODUODENOSCOPY (EGD) WITH PROPOFOL;  Surgeon: Scot Jun, MD;  Location: [facility] ENDOSCOPY;  Service: Endoscopy;  Laterality: N/A;  . ESOPHAGOGASTRODUODENOSCOPY (EGD) WITH PROPOFOL N/A 11/13/2016  Procedure: ESOPHAGOGASTRODUODENOSCOPY (EGD) WITH PROPOFOL;  Surgeon: Wyline Mood, MD;  Location: [facility] ENDOSCOPY;  Service: Endoscopy;  Laterality: N/A;  . ESOPHAGOGASTRODUODENOSCOPY (EGD) WITH PROPOFOL N/A  04/07/2017   Procedure: ESOPHAGOGASTRODUODENOSCOPY (EGD) WITH PROPOFOL;  Surgeon: Midge Minium, MD;  Location: [facility] ENDOSCOPY;  Service: Endoscopy;  Laterality: N/A;  . NO PAST SURGERIES      Prior to Admission medications   Medication Sig Start Date End Date Taking? Authorizing Provider  acamprosate (CAMPRAL) 333 MG tablet Take 2 tablets (666 mg total) by mouth 3 (three) times daily with meals. 03/24/17  Yes [name], [name], MD  albuterol (PROVENTIL HFA;VENTOLIN HFA) 108 (90 Base) MCG/ACT inhaler Inhale 2 puffs into the lungs every 6 (six) hours as needed for wheezing or shortness of breath. Patient taking differently: Inhale 2 puffs into the lungs every 4 (four) hours as needed for wheezing or shortness of breath.  10/08/16  Yes Shaune Pollack, MD  citalopram (CELEXA) 20 MG tablet Take 20 mg by mouth daily. 04/07/17  Yes [provider]  feeding supplement, ENSURE ENLIVE, (ENSURE ENLIVE) LIQD Take 237 mLs by mouth 2 (two) times daily between meals. 04/16/17  Yes Alford Highland, MD  ferrous sulfate 325 (65 FE) MG tablet Take 3 tablets (975 mg total) by mouth every Monday, Wednesday, and Friday. With breakfast 01/21/17  Yes [name], NP  folic acid (FOLVITE) 1 MG tablet Take 1 tablet (1 mg total) by mouth daily. 04/16/17  Yes Wieting, Richard, MD  furosemide (LASIX) 20 MG tablet Take 20 mg by mouth daily.   Yes [provider]  lactulose (CHRONULAC) 10 GM/15ML solution Take 45 mLs (30 g total) by mouth 4 (four) times daily. 04/16/17  Yes Alford Highland, MD  levETIRAcetam (KEPPRA) 750 MG tablet Take 2 tablets (1,500 mg total) by mouth 2 (two) times daily. 12/11/16 04/05/18 Yes Merrily Brittle, MD  losartan (COZAAR) 25 MG tablet Take 1 tablet (25 mg total) by mouth daily. 03/11/17  Yes [name], NP  lurasidone (LATUDA) 80 MG TABS tablet Take 1 tablet (80 mg total) by mouth daily with supper. 03/24/17  Yes [name], [name], MD  metFORMIN (GLUCOPHAGE) 500 MG tablet TAKE 1  TABLET BY MOUTH 2 TIMES DAILY WITH MEALS 03/26/17  Yes [name], NP  nadolol (CORGARD) 20 MG tablet Take 1 tablet (20 mg total) by mouth daily. 03/11/17  Yes [name], NP  omeprazole (PRILOSEC) 40 MG capsule Take 1 capsule (40 mg total) by mouth 2 (two) times daily. 04/16/17  Yes Wieting, Richard, MD  rifaximin (XIFAXAN) 550 MG TABS tablet Take 1 tablet (550 mg total) by mouth 2 (two) times daily. 07/09/16  Yes Katharina Caper, MD  spironolactone (ALDACTONE) 50 MG tablet Take 1 tablet (50 mg total) by mouth 2 (two) times daily. 03/02/17  Yes [name], NP  sucralfate (CARAFATE) 1 g tablet Take 1 tablet by mouth 4 (four) times daily.   Yes [provider]  thiamine (VITAMIN B-1) 100 MG tablet Take 100 mg by mouth daily.   Yes [provider]  traZODone (DESYREL) 100 MG tablet Take 1 tablet (100 mg total) by mouth at bedtime as needed for sleep. 03/24/17  Yes [name], [name], MD  NON FORMULARY     [provider]    Allergies Tramadol  Family History  Problem Relation Age of Onset  . Heart disease Mother   . Hypertension Mother   . Hyperlipidemia Mother   . Stroke Father   .  Heart attack Father   . Hypertension Father   . Heart disease Father   . Alcohol abuse Father   . Heart disease Brother     Social History Social History  Substance Use Topics  . Smoking status: Never Smoker  . Smokeless tobacco: Never Used  . Alcohol use 0.0 oz/week     Comment: last drink 14 days ago    Review of Systems Constitutional: No fever/chills Eyes: No visual changes. ENT: No sore throat. Cardiovascular: Denies chest pain. Respiratory: Denies shortness of breath. Gastrointestinal: Positive abdominal pain.  Positive nausea, positive vomiting.  No diarrhea.  No constipation. Genitourinary: Negative for dysuria. Musculoskeletal: Negative for back pain. Skin: Negative for rash. Neurological: Negative for headaches, focal weakness or  numbness.   ____________________________________________   PHYSICAL EXAM:  VITAL SIGNS: ED Triage Vitals  Enc Vitals Group     BP 05/03/17 1900 126/63     Pulse Rate 05/03/17 1900 73     Resp 05/03/17 1900 20     Temp 05/03/17 1900 98.9 F (37.2 C)     Temp Source 05/03/17 1900 Oral     SpO2 05/03/17 1900 97 %     Weight 05/03/17 1901 (!) 331 lb (150.1 kg)     Height 05/03/17 1901 6\' 3"  (1.905 m)     Head Circumference --      Peak Flow --      Pain Score 05/03/17 1900 6     Pain Loc --      Pain Edu? --      Excl. in GC? --     Constitutional: Chronically ill-appearing but at his baseline Eyes: PERRL EOMI. Head: Atraumatic. Nose: No congestion/rhinnorhea. Mouth/Throat: No trismus Neck: No stridor.   Cardiovascular: Normal rate, regular rhythm. Grossly normal heart sounds.  Good peripheral circulation. Respiratory: Normal respiratory effort.  No retractions. Lungs CTAB and moving good air Gastrointestinal: Obese abdomen soft nontender guaiac negative control positive brown stool Musculoskeletal: No lower extremity edema   Neurologic:  Normal speech and language. No gross focal neurologic deficits are appreciated. Skin:  Skin is warm, dry and intact. No rash noted. Psychiatric: Slow methodical speech appears delayed    ____________________________________________   DIFFERENTIAL includes but not limited to  Esophageal variceal rupture, Boerhaave syndrome, now Y stairs, gastritis ____________________________________________   LABS (all labs ordered are listed, but only abnormal results are displayed)  Labs Reviewed  COMPREHENSIVE METABOLIC PANEL - Abnormal; Notable for the following:       Result Value   Total Protein 8.4 (*)    AST 66 (*)    All other components within normal limits  CBC - Abnormal; Notable for the following:    RBC 4.04 (*)    Hemoglobin 12.0 (*)    HCT 35.3 (*)    RDW 17.8 (*)    All other components within normal limits  TYPE AND  SCREEN    Blood work at the patient's baseline __________________________________________  EKG   ____________________________________________  RADIOLOGY  Chest x-ray with no acute disease noted ____________________________________________   PROCEDURES  Procedure(s) performed: o  Procedures  Critical Care performed: no  Observation: no ____________________________________________   INITIAL IMPRESSION / ASSESSMENT AND PLAN / ED COURSE  Pertinent labs & imaging results that were available during my care of the patient were reviewed by me and considered in my medical decision making (see chart for details).  The patient arrives well-appearing hemodynamically stable with a benign abdominal exam and is guaiac negative. I  had a lengthy discussion with the patient and his mom at bedside and is seems that part of their concern is a poor understanding of his disease and they're worried that his medication is not working appropriately. I've given him reassurance that she likely had a Mallory-Weiss tear and his history and exam are not consistent with esophageal variceal rupture tear. I recommended he follow up with his primary care provider tomorrow for recheck and he understands he can return at any point for further evaluation.      ____________________________________________   FINAL CLINICAL IMPRESSION(S) / ED DIAGNOSES  Final diagnoses:  Hematemesis with nausea      NEW MEDICATIONS STARTED DURING THIS VISIT:  Discharge Medication List as of 05/03/2017 10:10 PM       Note:  This document was prepared using Dragon voice recognition software and may include unintentional dictation errors.     Merrily Brittleifenbark, Massiel Stipp, MD 05/04/17 1427

## 2017-05-03 NOTE — ED Notes (Signed)
Pt reports that at 3pm he vomited up a small amount of blood x2 - he is c/o mid abd pain (stabbing in nature) - he also c/o throbbing pain in mid sternal area - denies nausea at this time

## 2017-05-03 NOTE — ED Notes (Signed)
No vomiting noted since being in this room

## 2017-05-03 NOTE — ED Notes (Signed)
Pt assisted to stand at side of bed and use urinal to void

## 2017-05-03 NOTE — ED Notes (Signed)
See paper copy of discharge signature d/t signature pad not working

## 2017-05-03 NOTE — Discharge Instructions (Signed)
Please continue taking all of your home medications and follow-up with your primary care provider tomorrow for reexamination. Return to the emergency department sooner for any new or worsening symptoms such as if you throw up more blood, feel weak, passed out, or for any other concerns whatsoever.  It was a pleasure to take care of you today, and thank you for coming to our emergency department.  If you have any questions or concerns before leaving please ask the nurse to grab me and I'm more than happy to go through your aftercare instructions again.  If you were prescribed any opioid pain medication today such as Norco, Vicodin, Percocet, morphine, hydrocodone, or oxycodone please make sure you do not drive when you are taking this medication as it can alter your ability to drive safely.  If you have any concerns once you are home that you are not improving or are in fact getting worse before you can make it to your follow-up appointment, please do not hesitate to call 911 and come back for further evaluation.  Merrily Brittle, MD  Results for orders placed or performed during the hospital encounter of 05/03/17  Comprehensive metabolic panel  Result Value Ref Range   Sodium 136 135 - 145 mmol/L   Potassium 4.6 3.5 - 5.1 mmol/L   Chloride 103 101 - 111 mmol/L   CO2 25 22 - 32 mmol/L   Glucose, Bld 97 65 - 99 mg/dL   BUN 14 6 - 20 mg/dL   Creatinine, Ser 1.61 0.61 - 1.24 mg/dL   Calcium 9.2 8.9 - 09.6 mg/dL   Total Protein 8.4 (H) 6.5 - 8.1 g/dL   Albumin 4.0 3.5 - 5.0 g/dL   AST 66 (H) 15 - 41 U/L   ALT 61 17 - 63 U/L   Alkaline Phosphatase 120 38 - 126 U/L   Total Bilirubin 0.9 0.3 - 1.2 mg/dL   GFR calc non Af Amer >60 >60 mL/min   GFR calc Af Amer >60 >60 mL/min   Anion gap 8 5 - 15  CBC  Result Value Ref Range   WBC 7.2 3.8 - 10.6 K/uL   RBC 4.04 (L) 4.40 - 5.90 MIL/uL   Hemoglobin 12.0 (L) 13.0 - 18.0 g/dL   HCT 04.5 (L) 40.9 - 81.1 %   MCV 87.4 80.0 - 100.0 fL   MCH 29.7  26.0 - 34.0 pg   MCHC 34.0 32.0 - 36.0 g/dL   RDW 91.4 (H) 78.2 - 95.6 %   Platelets 189 150 - 440 K/uL  Type and screen [facility]  Result Value Ref Range   ABO/RH(D) PENDING    Antibody Screen PENDING    Sample Expiration 05/06/2017   Type and screen  Result Value Ref Range   ABO/RH(D) O POS    Antibody Screen NEG    Sample Expiration 05/06/2017    Dg Chest 2 View  Result Date: 05/03/2017 CLINICAL DATA:  Vomiting blood EXAM: CHEST  2 VIEW COMPARISON:  04/26/2017 FINDINGS: The heart size and mediastinal contours are within normal limits. Mild scarring in the left lung base unchanged. The visualized skeletal structures are unremarkable. IMPRESSION: No active cardiopulmonary disease. Electronically Signed   By: Marlan Palau M.D.   On: 05/03/2017 20:52   Dg Chest 2 View  Result Date: 04/17/2017 CLINICAL DATA:  Chest pain earlier today. Shortness of breath for 1 year. Syncope. EXAM: CHEST  2 VIEW COMPARISON:  04/03/2017 FINDINGS: Linear scarring in the left lung  base is unchanged since previous study. No airspace disease or consolidation in the lungs. Normal heart size and pulmonary vascularity. No blunting of costophrenic angles. No pneumothorax. IMPRESSION: Scarring in the left lung base. No evidence of active pulmonary disease. Electronically Signed   By: Burman Nieves M.D.   On: 04/17/2017 21:44   Ct Head Wo Contrast  Result Date: 04/22/2017 CLINICAL DATA:  Fall with head injury. Patient was seen in the ED earlier today. Now is back with a fall. Vomiting. EXAM: CT HEAD WITHOUT CONTRAST CT CERVICAL SPINE WITHOUT CONTRAST TECHNIQUE: Multidetector CT imaging of the head and cervical spine was performed following the standard protocol without intravenous contrast. Multiplanar CT image reconstructions of the cervical spine were also generated. COMPARISON:  CT head 04/17/2017.  CT head 03/25/2017. FINDINGS: CT HEAD FINDINGS Brain: No evidence of acute infarction,  hemorrhage, hydrocephalus, extra-axial collection or mass lesion/mass effect. Vascular: No hyperdense vessel or unexpected calcification. Skull: Normal. Negative for fracture or focal lesion. Sinuses/Orbits: No acute finding. Other: None. CT CERVICAL SPINE FINDINGS Alignment: Straightening of the usual cervical lordosis. This may be due to patient positioning but ligamentous injury or muscle spasm could also have this appearance. No anterior subluxation. Normal alignment of the facet joints. C1-2 articulation appears intact. Skull base and vertebrae: No acute fracture. No primary bone lesion or focal pathologic process. Soft tissues and spinal canal: No prevertebral fluid or swelling. No visible canal hematoma. Disc levels:  Intervertebral disc space heights are preserved. Upper chest: Motion artifact. Hypodense left thyroid nodule measuring 2.2 cm diameter. No acute process identified. Other: None. IMPRESSION: 1. No acute intracranial abnormalities. 2. Nonspecific straightening of usual cervical lordosis. No acute displaced fractures identified. Electronically Signed   By: Burman Nieves M.D.   On: 04/22/2017 00:18   Ct Head Wo Contrast  Result Date: 04/17/2017 CLINICAL DATA:  Syncope and fall. EXAM: CT HEAD WITHOUT CONTRAST TECHNIQUE: Contiguous axial images were obtained from the base of the skull through the vertex without intravenous contrast. COMPARISON:  CT scan of March 25, 2017. FINDINGS: Brain: No evidence of acute infarction, hemorrhage, hydrocephalus, extra-axial collection or mass lesion/mass effect. Vascular: No hyperdense vessel or unexpected calcification. Skull: Normal. Negative for fracture or focal lesion. Sinuses/Orbits: No acute finding. Other: None. IMPRESSION: Normal head CT. Electronically Signed   By: Lupita Raider, M.D.   On: 04/17/2017 21:34   Ct Cervical Spine Wo Contrast  Result Date: 04/22/2017 CLINICAL DATA:  Fall with head injury. Patient was seen in the ED earlier today. Now  is back with a fall. Vomiting. EXAM: CT HEAD WITHOUT CONTRAST CT CERVICAL SPINE WITHOUT CONTRAST TECHNIQUE: Multidetector CT imaging of the head and cervical spine was performed following the standard protocol without intravenous contrast. Multiplanar CT image reconstructions of the cervical spine were also generated. COMPARISON:  CT head 04/17/2017.  CT head 03/25/2017. FINDINGS: CT HEAD FINDINGS Brain: No evidence of acute infarction, hemorrhage, hydrocephalus, extra-axial collection or mass lesion/mass effect. Vascular: No hyperdense vessel or unexpected calcification. Skull: Normal. Negative for fracture or focal lesion. Sinuses/Orbits: No acute finding. Other: None. CT CERVICAL SPINE FINDINGS Alignment: Straightening of the usual cervical lordosis. This may be due to patient positioning but ligamentous injury or muscle spasm could also have this appearance. No anterior subluxation. Normal alignment of the facet joints. C1-2 articulation appears intact. Skull base and vertebrae: No acute fracture. No primary bone lesion or focal pathologic process. Soft tissues and spinal canal: No prevertebral fluid or swelling. No visible canal hematoma.  Disc levels:  Intervertebral disc space heights are preserved. Upper chest: Motion artifact. Hypodense left thyroid nodule measuring 2.2 cm diameter. No acute process identified. Other: None. IMPRESSION: 1. No acute intracranial abnormalities. 2. Nonspecific straightening of usual cervical lordosis. No acute displaced fractures identified. Electronically Signed   By: Burman Nieves M.D.   On: 04/22/2017 00:18   Ct Abdomen Pelvis W Contrast  Result Date: 04/22/2017 CLINICAL DATA:  Initial evaluation for acute trauma, fall. EXAM: CT ABDOMEN AND PELVIS WITH CONTRAST TECHNIQUE: Multidetector CT imaging of the abdomen and pelvis was performed using the standard protocol following bolus administration of intravenous contrast. CONTRAST:  ISOVUE-300 IOPAMIDOL (ISOVUE-300)  INJECTION 61% COMPARISON:  Prior CT from 03/25/2017. FINDINGS: Lower chest: Mild scattered atelectatic changes present within the visualized lung bases. Visualized lungs are otherwise clear. Hepatobiliary: Changes consistent with cirrhosis present within the liver. No focal intrahepatic mass. Gallbladder largely contracted. Mild wall thickening likely related incomplete distension. Gallbladder otherwise unremarkable. No biliary dilatation. Pancreas: Pancreas within normal limits. Spleen: Spleen is enlarged measuring 18 cm in craniocaudad dimension, like related underlying portal hypertension. Spleen otherwise unremarkable. Adrenals/Urinary Tract: Adrenal glands within normal limits. Kidneys equal in size with symmetric enhancement. No nephrolithiasis, hydronephrosis, or focal enhancing renal mass. No hydroureter. Bladder largely decompressed without acute abnormality. Stomach/Bowel: Stomach within normal limits. No evidence for bowel obstruction. Appendix normal. No acute inflammatory changes seen about the bowels. Vascular/Lymphatic: Normal intravascular enhancement seen throughout the intra-abdominal aorta in its branch vessels. Multiple prominent varices present within the upper abdomen, consistent with portal hypertension. Shotty subcentimeter retroperitoneal lymph nodes are stable. No pathologically enlarged intra-abdominal or pelvic lymph nodes identified. Reproductive: Prostate normal. Other: No free air or fluid. No ascites. No mesenteric or retroperitoneal hematoma. Hazy stranding within the mesenteric fat similar to previous, likely related underlying intrinsic liver disease. Musculoskeletal: Stable compression deformity involving the T7 and T11 vertebral bodies. No acute osseous abnormality. IMPRESSION: 1. No acute intra-abdominal or pelvic process identified. 2. Cirrhosis with evidence for portal hypertension, stable from previous. 3. Stable compression deformities involving the T7 and T11 vertebral  bodies. Electronically Signed   By: Rise Mu M.D.   On: 04/22/2017 00:34   US Abdomen Limited  Result Date: 04/15/2017 CLINICAL DATA:  36 year old male with a history of cirrhosis and recurrent ascites. EXAM: LIMITED ABDOMEN ULTRASOUND FOR ASCITES TECHNIQUE: Limited ultrasound survey for ascites was performed in all four abdominal quadrants. COMPARISON:  Most recent ultrasound study 12/19/2016. FINDINGS: Limited ultrasound of the abdomen demonstrates no ascites. IMPRESSION: Limited ultrasound demonstrates no ascites within the abdomen. Electronically Signed   By: Gilmer Mor D.O.   On: 04/15/2017 10:29   Dg Chest Port 1 View  Result Date: 04/26/2017 CLINICAL DATA:  Initial evaluation for hematemesis. EXAM: PORTABLE CHEST 1 VIEW COMPARISON:  Prior radiograph from 04/17/2017. FINDINGS: Mild cardiomegaly, stable.  Mediastinal silhouette normal. Lungs mildly hypoinflated. Mild left basilar scarring, unchanged. No focal infiltrates. No pulmonary edema or pleural effusion. No pneumothorax. IMPRESSION: 1. Mild left basilar scarring, stable. 2. No other active cardiopulmonary disease. Electronically Signed   By: Rise Mu M.D.   On: 04/26/2017 23:55   Dg Finger Index Right  Result Date: 04/29/2017 CLINICAL DATA:  Slammed right index finger in door, with pain. Initial encounter. EXAM: RIGHT INDEX FINGER 2+V COMPARISON:  None. FINDINGS: There is no evidence of fracture or dislocation. The right index finger appears intact. Visualized joint spaces are preserved. No definite soft tissue abnormalities are characterized on radiograph. IMPRESSION: No evidence  of fracture or dislocation. Electronically Signed   By: Roanna RaiderJeffery  Chang M.D.   On: 04/29/2017 01:39

## 2017-05-04 ENCOUNTER — Ambulatory Visit: Payer: Medicare Other | Admitting: Psychiatry

## 2017-05-07 ENCOUNTER — Ambulatory Visit (INDEPENDENT_AMBULATORY_CARE_PROVIDER_SITE_OTHER): Payer: Medicare Other | Admitting: Psychiatry

## 2017-05-07 ENCOUNTER — Encounter: Payer: Self-pay | Admitting: Psychiatry

## 2017-05-07 VITALS — BP 115/73 | HR 73

## 2017-05-07 DIAGNOSIS — F200 Paranoid schizophrenia: Secondary | ICD-10-CM

## 2017-05-07 NOTE — Progress Notes (Signed)
Follow-up for patient with schizophrenia and alcohol abuse. Medically he continues to suffer. He has been back to the hospital again for GI bleeding. He now is receiving hospice care. He is depressed about that but denies any suicidal ideation. Hallucinations are present but under control. He is still having trouble controlling his drinking and has been drinking 2 or 3 days a week.  Neatly dressed. Good eye contact. Affect euthymic overall and reactive. Thoughts lucid. No evidence of delusions. No suicidal ideation.  Supportive counseling. Continue medicine. Review the importance of staying sober and of keeping a positive outlook. Follow-up 2 weeks as he is benefiting from the continued attention and monitoring

## 2017-05-10 ENCOUNTER — Encounter: Payer: Self-pay | Admitting: Emergency Medicine

## 2017-05-10 ENCOUNTER — Emergency Department
Admission: EM | Admit: 2017-05-10 | Discharge: 2017-05-10 | Disposition: A | Discharge status: Home / Self Care | Payer: Medicare Other | Attending: Emergency Medicine | Admitting: Emergency Medicine

## 2017-05-10 DIAGNOSIS — M546 Pain in thoracic spine: Secondary | ICD-10-CM | POA: Insufficient documentation

## 2017-05-10 DIAGNOSIS — Z7984 Long term (current) use of oral hypoglycemic drugs: Secondary | ICD-10-CM | POA: Insufficient documentation

## 2017-05-10 DIAGNOSIS — J449 Chronic obstructive pulmonary disease, unspecified: Secondary | ICD-10-CM | POA: Diagnosis not present

## 2017-05-10 DIAGNOSIS — W19XXXA Unspecified fall, initial encounter: Secondary | ICD-10-CM

## 2017-05-10 DIAGNOSIS — E119 Type 2 diabetes mellitus without complications: Secondary | ICD-10-CM | POA: Diagnosis not present

## 2017-05-10 DIAGNOSIS — I10 Essential (primary) hypertension: Secondary | ICD-10-CM | POA: Insufficient documentation

## 2017-05-10 DIAGNOSIS — Z79899 Other long term (current) drug therapy: Secondary | ICD-10-CM | POA: Diagnosis not present

## 2017-05-10 MED ORDER — ACETAMINOPHEN 325 MG PO TABS
650.0000 mg | ORAL_TABLET | Freq: Once | ORAL | Status: DC
  Filled 2017-05-10: qty 2

## 2017-05-10 MED ORDER — ONDANSETRON 4 MG PO TBDP
4.0000 mg | ORAL_TABLET | Freq: Once | ORAL | Status: AC
  Administered 2017-05-10: 4 mg via ORAL
  Filled 2017-05-10: qty 1

## 2017-05-10 NOTE — ED Triage Notes (Signed)
Ems from home , fell while using a walker pt with hx of frequent falls

## 2017-05-10 NOTE — ED Provider Notes (Signed)
[facility] Emergency Department Provider Note   ____________________________________________    I have reviewed the triage vital signs and the nursing notes.   HISTORY  Chief Complaint Fall     HPI Howard [name] is a 36 y.o. male who presents after a fall from standing. Patient has a history of end-stage liver disease and apparently is on hospice for this. Patient reports he was walking with his walker and got his foot caught and fell forward, landing first on his knees and then following onto his walker. Since then he has had bilateral mid back pain. However he is ambulating well but does report discomfort with flexion. Denies abdominal pain. No chest pain. Did report feeling mildly nauseous but that has passed. Patient has morphine for pain at home.   Past Medical History:  Diagnosis Date  . Alcoholic cirrhosis of liver with ascites (HCC)   . Alcoholism (HCC)   . Anemia   . Atrial fibrillation (HCC)   . COPD (chronic obstructive pulmonary disease) (HCC)   . Depression   . Diabetes mellitus, type II (HCC)   . Esophageal varices (HCC)   . Heart disease    irregular heart beat (palpitations) and heart murmur  . Hyperlipemia   . Hypertension   . Liver disease   . Multiple thyroid nodules   . Portal hypertensive gastropathy (HCC)   . Schizophrenia (HCC)   . Seizures [facility])     Patient Active Problem List   Diagnosis Date Noted  . End stage liver disease (HCC) 04/24/2017  . Esophageal varices without bleeding (HCC)   . Hematemesis 04/05/2017  . Upper GI bleed   . Iron deficiency anemia due to chronic blood loss 02/16/2017  . Nausea 01/18/2017  . Respiratory failure with hypoxia (HCC) 12/14/2016  . Seizures (HCC) 12/12/2016  . DNR (do not resuscitate) discussion 08/11/2016  . Muscle weakness (generalized)   . GI bleed 08/07/2016  . OSA (obstructive sleep apnea) 04/29/2016  . Anemia 04/29/2016  . Thrombocytopenia (HCC) 04/29/2016  .  Coagulopathy (HCC) 04/29/2016  . Obesity 04/29/2016  . Controlled type 2 diabetes mellitus without complication (HCC) 01/15/2016  . Essential (primary) hypertension 12/11/2015  . Pure hypercholesterolemia 12/11/2015  . Hepatic encephalopathy (HCC) 10/16/2015  . 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 Elevated transaminase level 07/04/2015  . Alcohol abuse 05/31/2015  . Paranoid schizophrenia (HCC) 03/20/2015    Past Surgical History:  Procedure Laterality Date  . ESOPHAGOGASTRODUODENOSCOPY N/A 10/05/2015   Procedure: ESOPHAGOGASTRODUODENOSCOPY (EGD);  Surgeon: Elnita Maxwell, MD;  Location: [facility] ENDOSCOPY;  Service: Endoscopy;  Laterality: N/A;  . ESOPHAGOGASTRODUODENOSCOPY (EGD) WITH PROPOFOL N/A 08/03/2015   Procedure: ESOPHAGOGASTRODUODENOSCOPY (EGD) WITH PROPOFOL;  Surgeon: Elnita Maxwell, MD;  Location: [facility] ENDOSCOPY;  Service: Endoscopy;  Laterality: N/A;  . ESOPHAGOGASTRODUODENOSCOPY (EGD) WITH PROPOFOL N/A 08/31/2015   Procedure: ESOPHAGOGASTRODUODENOSCOPY (EGD) WITH PROPOFOL;  Surgeon: Elnita Maxwell, MD;  Location: [facility] ENDOSCOPY;  Service: Endoscopy;  Laterality: N/A;  . ESOPHAGOGASTRODUODENOSCOPY (EGD) WITH PROPOFOL N/A 04/04/2016   Procedure: ESOPHAGOGASTRODUODENOSCOPY (EGD) WITH PROPOFOL;  Surgeon: Scot Jun, MD;  Location: [facility] ENDOSCOPY;  Service: Endoscopy;  Laterality: N/A;  . ESOPHAGOGASTRODUODENOSCOPY (EGD) WITH PROPOFOL N/A 11/13/2016   Procedure: ESOPHAGOGASTRODUODENOSCOPY (EGD) WITH PROPOFOL;  Surgeon: Wyline Mood, MD;  Location: [facility] ENDOSCOPY;  Service: Endoscopy;  Laterality: N/A;  . ESOPHAGOGASTRODUODENOSCOPY (EGD) WITH PROPOFOL N/A 04/07/2017   Procedure: ESOPHAGOGASTRODUODENOSCOPY (EGD) WITH PROPOFOL;  Surgeon: Midge Minium, MD;  Location: [facility] ENDOSCOPY;  Service: Endoscopy;  Laterality: N/A;  . NO PAST SURGERIES      Prior to Admission medications   Medication Sig Start Date End Date Taking? Authorizing Provider    acamprosate (CAMPRAL) 333 MG tablet Take 2 tablets (666 mg total) by mouth 3 (three) times daily with meals. 03/24/17   [name], [name], MD  albuterol (PROVENTIL HFA;VENTOLIN HFA) 108 (90 Base) MCG/ACT inhaler Inhale 2 puffs into the lungs every 6 (six) hours as needed for wheezing or shortness of breath. Patient taking differently: Inhale 2 puffs into the lungs every 4 (four) hours as needed for wheezing or shortness of breath.  10/08/16   Shaune Pollack, MD  citalopram (CELEXA) 20 MG tablet Take 20 mg by mouth daily. 04/07/17   [provider]  feeding supplement, ENSURE ENLIVE, (ENSURE ENLIVE) LIQD Take 237 mLs by mouth 2 (two) times daily between meals. 04/16/17   Alford Highland, MD  ferrous sulfate 325 (65 FE) MG tablet Take 3 tablets (975 mg total) by mouth every Monday, Wednesday, and Friday. With breakfast 01/21/17   [name], NP  folic acid (FOLVITE) 1 MG tablet Take 1 tablet (1 mg total) by mouth daily. 04/16/17   Alford Highland, MD  furosemide (LASIX) 20 MG tablet Take 20 mg by mouth daily.    [provider]  lactulose (CHRONULAC) 10 GM/15ML solution Take 45 mLs (30 g total) by mouth 4 (four) times daily. 04/16/17   Alford Highland, MD  levETIRAcetam (KEPPRA) 750 MG tablet Take 2 tablets (1,500 mg total) by mouth 2 (two) times daily. 12/11/16 04/05/18  Merrily Brittle, MD  losartan (COZAAR) 25 MG tablet Take 1 tablet (25 mg total) by mouth daily. 03/11/17   [name], NP  lurasidone (LATUDA) 80 MG TABS tablet Take 1 tablet (80 mg total) by mouth daily with supper. 03/24/17   [name], [name], MD  metFORMIN (GLUCOPHAGE) 500 MG tablet TAKE 1 TABLET BY MOUTH 2 TIMES DAILY WITH MEALS 03/26/17   [name], NP  nadolol (CORGARD) 20 MG tablet Take 1 tablet (20 mg total) by mouth daily. 03/11/17   [name], NP  NON FORMULARY     [provider]  omeprazole (PRILOSEC) 40 MG capsule Take 1 capsule (40 mg total) by mouth 2 (two) times  daily. 04/16/17   Alford Highland, MD  rifaximin (XIFAXAN) 550 MG TABS tablet Take 1 tablet (550 mg total) by mouth 2 (two) times daily. 07/09/16   Katharina Caper, MD  spironolactone (ALDACTONE) 50 MG tablet Take 1 tablet (50 mg total) by mouth 2 (two) times daily. 03/02/17   [name], NP  sucralfate (CARAFATE) 1 g tablet Take 1 tablet by mouth 4 (four) times daily.    [provider]  thiamine (VITAMIN B-1) 100 MG tablet Take 100 mg by mouth daily.    [provider]  traZODone (DESYREL) 100 MG tablet Take 1 tablet (100 mg total) by mouth at bedtime as needed for sleep. 03/24/17   [name], [name], MD     Allergies Tramadol  Family History  Problem Relation Age of Onset  . Heart disease Mother   . Hypertension Mother   . Hyperlipidemia Mother   . Stroke Father   . Heart attack Father   . Hypertension Father   . Heart disease Father   . Alcohol abuse Father   . Heart disease Brother     Social History Social History  Substance Use Topics  . Smoking status: Never Smoker  .  Smokeless tobacco: Never Used  . Alcohol use 0.0 oz/week     Comment: last drink 14 days ago    Review of Systems  Constitutional: No fever/chills  ENT: No Neck pain   Gastrointestinal: No abdominal pain.   Genitourinary: Negative for dysuria. Musculoskeletal: As above Skin: Negative for rash. Neurological: Negative for headaches     ____________________________________________   PHYSICAL EXAM:  VITAL SIGNS: ED Triage Vitals  Enc Vitals Group     BP 05/10/17 1815 135/70     Pulse Rate 05/10/17 1815 72     Resp 05/10/17 1815 20     Temp 05/10/17 1815 98.1 F (36.7 C)     Temp Source 05/10/17 1815 Oral     SpO2 05/10/17 1815 95 %     Weight 05/10/17 1815 (!) 150.1 kg (331 lb)     Height 05/10/17 1815 1.905 m (6\' 3" )     Head Circumference --      Peak Flow --      Pain Score 05/10/17 1826 8     Pain Loc --      Pain Edu? --      Excl. in GC? --      Constitutional: Alert and oriented. No acute distress. Pleasant and interactive  Head: Atraumatic.  Mouth/Throat: Mucous membranes are moist.   Cardiovascular: Normal rate, regular rhythm. Clear to auscultation bilaterally Respiratory: Normal respiratory effort.  No retractions. Genitourinary: deferred Musculoskeletal: No vertebral tenderness to palpation, normal range of motion of the back, no bruising. Normal strength in the lower extremities. No abdominal tenderness to palpation.   Neurologic:  Normal speech and language. No gross focal neurologic deficits are appreciated.   Skin:  Skin is warm, dry and intact. No rash noted.   ____________________________________________   LABS (all labs ordered are listed, but only abnormal results are displayed)  Labs Reviewed - No data to display ____________________________________________  EKG   ____________________________________________  RADIOLOGY  None ____________________________________________   PROCEDURES  Procedure(s) performed: No    Critical Care performed: No ____________________________________________   INITIAL IMPRESSION / ASSESSMENT AND PLAN / ED COURSE  Pertinent labs & imaging results that were available during my care of the patient were reviewed by me and considered in my medical decision making (see chart for details).  Patient with fall to his knees and then forward onto his walker. Exam is reassuring, I do not feel need for imaging given low impact fall. Abdominal exam is reassuring, the patient briefly had nausea and was given some nausea medication.  Observed ambulating well and appropriate for discharge and outpatient follow-up   ____________________________________________   FINAL CLINICAL IMPRESSION(S) / ED DIAGNOSES  Final diagnoses:  Fall, initial encounter  Acute bilateral thoracic back pain      NEW MEDICATIONS STARTED DURING THIS VISIT:  New Prescriptions   No  medications on file     Note:  This document was prepared using Dragon voice recognition software and may include unintentional dictation errors.    Jene Every, MD 05/10/17 [phone]

## 2017-05-11 ENCOUNTER — Ambulatory Visit (INDEPENDENT_AMBULATORY_CARE_PROVIDER_SITE_OTHER): Payer: Medicare Other | Admitting: Nurse Practitioner

## 2017-05-11 ENCOUNTER — Other Ambulatory Visit: Payer: Self-pay | Admitting: Nurse Practitioner

## 2017-05-11 ENCOUNTER — Encounter: Payer: Self-pay | Admitting: Nurse Practitioner

## 2017-05-11 VITALS — BP 113/71 | HR 71 | Temp 97.8°F | Ht 75.0 in | Wt 334.0 lb

## 2017-05-11 DIAGNOSIS — K7210 Chronic hepatic failure without coma: Secondary | ICD-10-CM

## 2017-05-11 DIAGNOSIS — K644 Residual hemorrhoidal skin tags: Secondary | ICD-10-CM

## 2017-05-11 DIAGNOSIS — K7290 Hepatic failure, unspecified without coma: Secondary | ICD-10-CM

## 2017-05-11 DIAGNOSIS — F1010 Alcohol abuse, uncomplicated: Secondary | ICD-10-CM | POA: Diagnosis not present

## 2017-05-11 MED ORDER — HYDROCORTISONE 2.5 % RE CREA: 1.0000 "application " | TOPICAL_CREAM | Freq: Two times a day (BID) | RECTAL | 0 refills | Status: DC

## 2017-05-11 NOTE — Patient Instructions (Addendum)
Lemond, Thank you for coming in to clinic today.  1. PROMISE to take 2 doses of lactulose every day.  Do your best to take 4 doses every day.   - It will help keep you out of the hospital - It will help your mind be more clear - It will help your tremor go away.  2. Work toward finding a stress management tool other than alcohol when you are having a bad day.  We want that 30 day chip.  Please schedule a follow-up appointment with [name], AGNP. Return in about 4 weeks (around 06/08/2017) for liver failure.  If you have any other questions or concerns, please feel free to call the clinic or send a message through MyChart. You may also schedule an earlier appointment if necessary.  You will receive a survey after today's visit either digitally by e-mail or paper by [employer]. Your experiences and feedback matter to [place].  Please respond so we know how we are doing as we provide care for you.   [name], DNP, AGNP-BC Adult Gerontology Nurse Practitioner [facility], [facility]

## 2017-05-11 NOTE — Progress Notes (Signed)
Subjective:    Patient ID: Howard [name], male    DOB: 1981-06-23, 36 y.o.   MRN: [id number]  Howard [name] is a 36 y.o. male presenting on 05/11/2017 for Liver Failure (Recent ED visit for fall, pt walker tipped over. )   HPI Liver Failure Pt states he fell yesterday.  He lost balance when walker wheels stopped against floor transition and pt fell forward. - Has returned wheelchair - Pt states he is stronger and can use walker.  Is going to be getting PT started w/ [facility]. - Regular hand tremor.  - Lactulose - no doses yet today.  One dose yesterday, none day before.  Quit taking lactulose because of hand tremor (splashes out of his cup) and diarrhea and hemorrhoid flare. - "Memory is going and can't think clearly."   - Has not had hematemesis "recently." - Pt notes 4 lb weight gain (eating better, but states it could be swelling in abdomen)  Alcohol Abuse Has been sober 5 days.  Had previously been sober 14 days at last visit.  Pt now notes he is wanting to drink more r/t PTSD coping.   Pt notes talking to his brother helps him w/ coping w/ PTSD sometimes.  Social History  Substance Use Topics  . Smoking status: Never Smoker  . Smokeless tobacco: Never Used  . Alcohol use 0.0 oz/week     Comment: last drink 5 days ago (noted: 05/11/2017)    Review of Systems Per HPI unless specifically indicated above     Objective:    BP 113/71 (BP Location: Right Arm, Patient Position: Sitting, Cuff Size: Large)   Pulse 71   Temp 97.8 F (36.6 C) (Oral)   Ht 6\' 3"  (1.905 m)   Wt (!) 334 lb (151.5 kg)   SpO2 97%   BMI 41.75 kg/m   Wt Readings from Last 3 Encounters:  05/11/17 (!) 334 lb (151.5 kg)  05/10/17 (!) 331 lb (150.1 kg)  05/03/17 (!) 331 lb (150.1 kg)    Physical Exam  General - obese, ill-appearing w/ dusky skin, NAD HEENT - Normocephalic, atraumatic, PERRL, EOMI, patent nares w/o congestion, oropharynx clear, MMM Heart - RRR, no murmurs heard Lungs - Clear  throughout all lobes, no wheezing, crackles, or rhonchi. Normal work of breathing. Abdomen - soft, NTND, no masses, no hepatosplenomegaly, active bowel sounds, Mild fluid wave - ascites increased from last exam Extremeties - non-tender, no edema, cap refill < 2 seconds, peripheral pulses intact +2 bilaterally Skin - warm, dry, no rashes, mild jaundice, no scleral jaundice Neuro - awake, alert, oriented x3, foot flap/steppage gait, hand tremor/liver flap L>R Psych - Normal mood and affect, normal behavior   Results for orders placed or performed during the hospital encounter of 05/03/17  Comprehensive metabolic panel  Result Value Ref Range   Sodium 136 135 - 145 mmol/L   Potassium 4.6 3.5 - 5.1 mmol/L   Chloride 103 101 - 111 mmol/L   CO2 25 22 - 32 mmol/L   Glucose, Bld 97 65 - 99 mg/dL   BUN 14 6 - 20 mg/dL   Creatinine, Ser 4.85 0.61 - 1.24 mg/dL   Calcium 9.2 8.9 - 46.2 mg/dL   Total Protein 8.4 (H) 6.5 - 8.1 g/dL   Albumin 4.0 3.5 - 5.0 g/dL   AST 66 (H) 15 - 41 U/L   ALT 61 17 - 63 U/L   Alkaline Phosphatase 120 38 - 126 U/L   Total Bilirubin 0.9  0.3 - 1.2 mg/dL   GFR calc non Af Amer >60 >60 mL/min   GFR calc Af Amer >60 >60 mL/min   Anion gap 8 5 - 15  CBC  Result Value Ref Range   WBC 7.2 3.8 - 10.6 K/uL   RBC 4.04 (L) 4.40 - 5.90 MIL/uL   Hemoglobin 12.0 (L) 13.0 - 18.0 g/dL   HCT 04.5 (L) 40.9 - 81.1 %   MCV 87.4 80.0 - 100.0 fL   MCH 29.7 26.0 - 34.0 pg   MCHC 34.0 32.0 - 36.0 g/dL   RDW 91.4 (H) 78.2 - 95.6 %   Platelets 189 150 - 440 K/uL  Type and screen  Result Value Ref Range   ABO/RH(D) O POS    Antibody Screen NEG    Sample Expiration 05/06/2017       Assessment & Plan:   Problem List Items Addressed This Visit      Cardiovascular and Mediastinum   Inflamed external hemorrhoid - Primary    Acute inflammation w/ symptoms > 3 days.  Plan: 1. Continue anusol.  Prescription sent.  Pt previously using preparation H but was out. 2. Follow up as  needed.      Relevant Medications   hydrocortisone (ANUSOL-HC) 2.5 % rectal cream     Digestive   End stage liver disease (HCC)    Worsening active liver flap today.  Pt admits to only rare lactulose use citing significant difficulty with bowel movements and taste of medication. Ongoing concern w/o improvement.  Plan: 1. Encouraged 4 doses lactulose daily.  If unable to take 4 doses, must take at least 2.  None today.  Pt educated.   Pt verbalizes understanding for necessity of lactulose to prevent liver flap, seizures, cognition decline, and hospitalization. 2. Follow up 4 weeks or sooner.  Will continue to collaborate closely w/ [facility].      Relevant Orders   Ammonia   Comprehensive metabolic panel   CBC with Differential/Platelet     Other   Alcohol abuse    Ongoing alcohol use 1-2 days per week. Pt w/o other good coping mechanisms.  Plan: 1. Discussed importance of quitting completely.  Recommended other coping skills.  Discussed relapse in recovery.  Encouraged ongoing efforts to completely quit.  Brother provides some support for coping, but also enables pt w/ alcohol use - pt transport and supplier of alcohol. 2. Follow up 4 weeks.         Meds ordered this encounter  Medications  . hydrocortisone (ANUSOL-HC) 2.5 % rectal cream    Sig: Place 1 application rectally 2 (two) times daily.    Dispense:  30 g    Refill:  0    Order Specific Question:   Supervising Provider    Answer:   Smitty Cords [[id number]]      Follow up plan: Return in about 4 weeks (around 06/08/2017) for liver failure.   [name], DNP, AGPCNP-BC Adult Gerontology Primary Care Nurse Practitioner [facility], [facility] 05/13/2017, 1:11 PM

## 2017-05-12 ENCOUNTER — Ambulatory Visit (INDEPENDENT_AMBULATORY_CARE_PROVIDER_SITE_OTHER): Payer: Medicare Other | Admitting: Licensed Clinical Social Worker

## 2017-05-12 DIAGNOSIS — F200 Paranoid schizophrenia: Secondary | ICD-10-CM

## 2017-05-13 ENCOUNTER — Encounter: Payer: Self-pay | Admitting: Nurse Practitioner

## 2017-05-13 DIAGNOSIS — K644 Residual hemorrhoidal skin tags: Secondary | ICD-10-CM | POA: Insufficient documentation

## 2017-05-13 NOTE — Assessment & Plan Note (Signed)
Active liver flap today.  Pt admits to only rare lactulose use citing significant difficulty with bowel movements and taste of medication. Ongoing concern w/o improvement.  Plan: 1. Encouraged 4 doses lactulose daily.  If unable to take 4 doses, must take at least 2.  None today.  Pt educated.   Pt verbalizes understanding for necessity of lactulose to prevent liver flap, seizures, cognition decline, and hospitalization. 2. Follow up 4 weeks or sooner.  Will continue to collaborate closely w/ [facility].

## 2017-05-13 NOTE — Assessment & Plan Note (Addendum)
Acute inflammation w/ symptoms > 3 days.  Plan: 1. Continue anusol.  Prescription sent.  Pt previously using preparation H but was out. 2. Follow up as needed.

## 2017-05-13 NOTE — Progress Notes (Signed)
I have reviewed this encounter including the documentation in this note and/or discussed this patient with the provider, [name], AGPCNP-BC. I am certifying that I agree with the content of this note as supervising physician.  Saralyn Pilar, DO [facility] 05/13/2017, 6:34 PM

## 2017-05-13 NOTE — Assessment & Plan Note (Signed)
Ongoing alcohol use 1-2 days per week. Pt w/o other good coping mechanisms.  Plan: 1. Discussed importance of quitting completely.  Recommended other coping skills.  Discussed relapse in recovery.  Encouraged ongoing efforts to completely quit.  Brother provides some support for coping, but also enables pt w/ alcohol use - pt transport and supplier of alcohol. 2. Follow up 4 weeks.

## 2017-05-20 ENCOUNTER — Other Ambulatory Visit: Payer: Self-pay | Admitting: Nurse Practitioner

## 2017-05-21 ENCOUNTER — Ambulatory Visit (INDEPENDENT_AMBULATORY_CARE_PROVIDER_SITE_OTHER): Payer: Medicare Other | Admitting: Licensed Clinical Social Worker

## 2017-05-21 ENCOUNTER — Ambulatory Visit: Payer: Self-pay | Admitting: Psychiatry

## 2017-05-21 ENCOUNTER — Ambulatory Visit (INDEPENDENT_AMBULATORY_CARE_PROVIDER_SITE_OTHER): Payer: Medicare Other | Admitting: Psychiatry

## 2017-05-21 DIAGNOSIS — F1010 Alcohol abuse, uncomplicated: Secondary | ICD-10-CM

## 2017-05-21 DIAGNOSIS — F200 Paranoid schizophrenia: Secondary | ICD-10-CM

## 2017-05-21 MED ORDER — LURASIDONE HCL 40 MG PO TABS: 120.0000 mg | ORAL_TABLET | Freq: Every day | ORAL | 3 refills | Status: DC

## 2017-05-21 NOTE — Progress Notes (Signed)
Follow-up for 36 year old man with schizophrenia and alcohol abuse. He has not had a drink now in over a week which is very good for him. He is having significant liver pathology. He does not take his lactulose regularly. Affect blunted. No suicidal thoughts but continues to be troubled by hallucinations.  Neatly dressed and groomed. Clearly looking more run down and sick. Thoughts are slow but lucid. No suicidal thoughts. Bothered by hallucinations.  As always supportive therapy and encouragement around his efforts at sobriety while reminding him of the life-threatening nature of his condition. I am going to increase the Latuda to 120 mg because of his ongoing hallucinations. We will check up again in another 2 weeks

## 2017-05-26 ENCOUNTER — Other Ambulatory Visit: Payer: Self-pay | Admitting: Nurse Practitioner
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
## 2017-05-28 NOTE — Progress Notes (Signed)
   THERAPIST PROGRESS NOTE  Session Time: 45min  Participation Level: Active  Behavioral Response: CasualAlertDepressed  Type of Therapy: Individual Therapy  Treatment Goals addressed: Coping  Interventions: Motivational Interviewing, Supportive and Reframing  Summary: Howard LinemanJoey A [name] is a 36 y.o. male who presents with a depressed mood.  Patient reports that he has been sober for the past 3 days.  He reports that he wants to remain sober for the remainder of his life.  He reports that he has about 3 months to live.  He reports that he continues to stay busy to avoid reminders of his father and his prognosis.  He reports having support from his church family.  He reports that he interacts with his brother daily.  Role played coping skills and how to problem solve.  Suicidal/Homicidal: No  Therapist Response: LCSW provided Patient with ongoing emotional support and encouragement.  Normalized his feelings.  Commended Patient on progress and reinforced the importance of client staying focused on his own strengths and resources and resiliency. Processed various strategies for dealing with stressors.   Plan: Return again in 2 weeks.  Diagnosis: Axis I: Paranoid Schizophrenia    Axis II: No diagnosis    Marinda Elkicole M Adriauna Campton, LCSW 05/21/2017

## 2017-05-30 ENCOUNTER — Emergency Department
Admission: EM | Admit: 2017-05-30 | Discharge: 2017-05-30 | Disposition: A | Discharge status: Home / Self Care | Payer: Medicare Other | Attending: Emergency Medicine | Admitting: Emergency Medicine

## 2017-05-30 ENCOUNTER — Encounter: Payer: Self-pay | Admitting: Emergency Medicine

## 2017-05-30 DIAGNOSIS — I10 Essential (primary) hypertension: Secondary | ICD-10-CM | POA: Diagnosis not present

## 2017-05-30 DIAGNOSIS — J449 Chronic obstructive pulmonary disease, unspecified: Secondary | ICD-10-CM | POA: Insufficient documentation

## 2017-05-30 DIAGNOSIS — R42 Dizziness and giddiness: Secondary | ICD-10-CM

## 2017-05-30 DIAGNOSIS — Z79899 Other long term (current) drug therapy: Secondary | ICD-10-CM | POA: Diagnosis not present

## 2017-05-30 DIAGNOSIS — E119 Type 2 diabetes mellitus without complications: Secondary | ICD-10-CM | POA: Insufficient documentation

## 2017-05-30 DIAGNOSIS — Z7984 Long term (current) use of oral hypoglycemic drugs: Secondary | ICD-10-CM | POA: Diagnosis not present

## 2017-05-30 DIAGNOSIS — E7220 Disorder of urea cycle metabolism, unspecified: Secondary | ICD-10-CM | POA: Diagnosis not present

## 2017-05-30 LAB — CBC
HCT: 34.1 % — ABNORMAL LOW (ref 40.0–52.0)
Hemoglobin: 11.6 g/dL — ABNORMAL LOW (ref 13.0–18.0)
MCH: 30.4 pg (ref 26.0–34.0)
MCHC: 33.9 g/dL (ref 32.0–36.0)
MCV: 89.5 fL (ref 80.0–100.0)
PLATELETS: 100 10*3/uL — AB (ref 150–440)
RBC: 3.81 MIL/uL — AB (ref 4.40–5.90)
RDW: 15.6 % — ABNORMAL HIGH (ref 11.5–14.5)
WBC: 4.3 10*3/uL (ref 3.8–10.6)

## 2017-05-30 LAB — COMPREHENSIVE METABOLIC PANEL
ALBUMIN: 3.7 g/dL (ref 3.5–5.0)
ALT: 50 U/L (ref 17–63)
ANION GAP: 8 (ref 5–15)
AST: 54 U/L — ABNORMAL HIGH (ref 15–41)
Alkaline Phosphatase: 96 U/L (ref 38–126)
BUN: 12 mg/dL (ref 6–20)
CHLORIDE: 107 mmol/L (ref 101–111)
CO2: 23 mmol/L (ref 22–32)
CREATININE: 1.05 mg/dL (ref 0.61–1.24)
Calcium: 9.3 mg/dL (ref 8.9–10.3)
GFR calc non Af Amer: >60 mL/min (ref >60)
Glucose, Bld: 124 mg/dL — ABNORMAL HIGH (ref 65–99)
Potassium: 4.7 mmol/L (ref 3.5–5.1)
SODIUM: 138 mmol/L (ref 135–145)
Total Bilirubin: 0.9 mg/dL (ref 0.3–1.2)
Total Protein: 7.9 g/dL (ref 6.5–8.1)

## 2017-05-30 LAB — AMMONIA: Ammonia: 92 umol/L — ABNORMAL HIGH (ref 9–35)

## 2017-05-30 LAB — TROPONIN I

## 2017-05-30 NOTE — Discharge Instructions (Signed)
you were evaluated for dizziness and fall, and no serious injury is suspected. No certain cause was found for the dizziness, but states that that may be related to elevated ammonia. As we discussed, please take your prescription lactulose and other medications.  Return to the emergency room immediately for any worsening condition including vomiting blood, black or bloody stools, nor worsening chest pain or abdominal pain, confusion altered mental status, or any other symptoms concerning to you.

## 2017-05-30 NOTE — ED Triage Notes (Signed)
Pt to ED via EMS from home c/o fall today, states got dizzy at home while walking to the kitchen.  Patient took off home O2 to walk.  Patient also states left side pain prior to the fall.  Reports not taking lactulose for 1 week.  Presents A&Ox4, chest rise even and unlabored.

## 2017-05-30 NOTE — ED Provider Notes (Signed)
[facility] Emergency Department Provider Note ____________________________________________   I have reviewed the triage vital signs and the triage nursing note.  HISTORY  Chief Complaint Fall and Dizziness   Historian Patient  HPI Howard [name] is a 36 y.o. male With history of alcoholic cirrhosis of the liver and history of high ammonia levels presents from EMS after a fall. Patient states that he felt dizzy and fell. Denies striking his head. Denies any pain or injury from the fall itself. Patient states that his ammonia level may be high. Patient states he's not been taking his lactulose because it gives him diarrhea.  States he is having some mild left-sided abdominal discomfort which has been there for about a week or so, unrelated to the fall.  Currently no chest pain or trouble breathing nor headache, nor dizziness at rest. Denies vision changes. Denies fevers.    Past Medical History:  Diagnosis Date  . Alcoholic cirrhosis of liver with ascites (HCC)   . Alcoholism (HCC)   . Anemia   . Atrial fibrillation (HCC)   . COPD (chronic obstructive pulmonary disease) (HCC)   . Depression   . Diabetes mellitus, type II (HCC)   . Esophageal varices (HCC)   . Heart disease    irregular heart beat (palpitations) and heart murmur  . Hyperlipemia   . Hypertension   . Liver disease   . Multiple thyroid nodules   . Portal hypertensive gastropathy (HCC)   . Schizophrenia (HCC)   . Seizures [facility])     Patient Active Problem List   Diagnosis Date Noted  . Inflamed external hemorrhoid 05/13/2017  . End stage liver disease (HCC) 04/24/2017  . Esophageal varices without bleeding (HCC)   . Hematemesis 04/05/2017  . Upper GI bleed   . Iron deficiency anemia due to chronic blood loss 02/16/2017  . Nausea 01/18/2017  . Respiratory failure with hypoxia (HCC) 12/14/2016  . Seizures (HCC) 12/12/2016  . DNR (do not resuscitate) discussion 08/11/2016  . Muscle  weakness (generalized)   . GI bleed 08/07/2016  . OSA (obstructive sleep apnea) 04/29/2016  . Anemia 04/29/2016  . Thrombocytopenia (HCC) 04/29/2016  . Coagulopathy (HCC) 04/29/2016  . Obesity 04/29/2016  . Controlled type 2 diabetes mellitus without complication (HCC) 01/15/2016  . Essential (primary) hypertension 12/11/2015  . Pure hypercholesterolemia 12/11/2015  . Hepatic encephalopathy (HCC) 10/16/2015  . 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 Elevated transaminase level 07/04/2015  . Alcohol abuse 05/31/2015  . Paranoid schizophrenia (HCC) 03/20/2015    Past Surgical History:  Procedure Laterality Date  . ESOPHAGOGASTRODUODENOSCOPY N/A 10/05/2015   Procedure: ESOPHAGOGASTRODUODENOSCOPY (EGD);  Surgeon: Elnita Maxwell, MD;  Location: [facility] ENDOSCOPY;  Service: Endoscopy;  Laterality: N/A;  . ESOPHAGOGASTRODUODENOSCOPY (EGD) WITH PROPOFOL N/A 08/03/2015   Procedure: ESOPHAGOGASTRODUODENOSCOPY (EGD) WITH PROPOFOL;  Surgeon: Elnita Maxwell, MD;  Location: [facility] ENDOSCOPY;  Service: Endoscopy;  Laterality: N/A;  . ESOPHAGOGASTRODUODENOSCOPY (EGD) WITH PROPOFOL N/A 08/31/2015   Procedure: ESOPHAGOGASTRODUODENOSCOPY (EGD) WITH PROPOFOL;  Surgeon: Elnita Maxwell, MD;  Location: [facility] ENDOSCOPY;  Service: Endoscopy;  Laterality: N/A;  . ESOPHAGOGASTRODUODENOSCOPY (EGD) WITH PROPOFOL N/A 04/04/2016   Procedure: ESOPHAGOGASTRODUODENOSCOPY (EGD) WITH PROPOFOL;  Surgeon: Scot Jun, MD;  Location: [facility] ENDOSCOPY;  Service: Endoscopy;  Laterality: N/A;  . ESOPHAGOGASTRODUODENOSCOPY (EGD) WITH PROPOFOL N/A 11/13/2016   Procedure: ESOPHAGOGASTRODUODENOSCOPY (EGD) WITH PROPOFOL;  Surgeon: Wyline Mood, MD;  Location: [facility] ENDOSCOPY;  Service: Endoscopy;  Laterality: N/A;  . ESOPHAGOGASTRODUODENOSCOPY (EGD) WITH PROPOFOL N/A  04/07/2017   Procedure: ESOPHAGOGASTRODUODENOSCOPY (EGD) WITH PROPOFOL;  Surgeon: Midge Minium, MD;  Location: [facility] ENDOSCOPY;  Service:  Endoscopy;  Laterality: N/A;  . NO PAST SURGERIES      Prior to Admission medications   Medication Sig Start Date End Date Taking? Authorizing Provider  acamprosate (CAMPRAL) 333 MG tablet Take 2 tablets (666 mg total) by mouth 3 (three) times daily with meals. 03/24/17   [name], [name], MD  albuterol (PROVENTIL HFA;VENTOLIN HFA) 108 (90 Base) MCG/ACT inhaler Inhale 2 puffs into the lungs every 6 (six) hours as needed for wheezing or shortness of breath. Patient not taking: Reported on 05/11/2017 10/08/16   Shaune Pollack, MD  citalopram (CELEXA) 20 MG tablet Take 20 mg by mouth daily. 04/07/17   [provider]  feeding supplement, ENSURE ENLIVE, (ENSURE ENLIVE) LIQD Take 237 mLs by mouth 2 (two) times daily between meals. 04/16/17   Alford Highland, MD  ferrous sulfate 325 (65 FE) MG tablet Take 3 tablets (975 mg total) by mouth every Monday, Wednesday, and Friday. With breakfast 01/21/17   [name], NP  folic acid (FOLVITE) 1 MG tablet TAKE 1 TABLET BY MOUTH DAILY 05/26/17   [name], NP  furosemide (LASIX) 20 MG tablet Take 20 mg by mouth daily.    [provider]  hydrocortisone (ANUSOL-HC) 2.5 % rectal cream Place 1 application rectally 2 (two) times daily. 05/11/17   [name], NP  lactulose (CHRONULAC) 10 GM/15ML solution Take 45 mLs (30 g total) by mouth 4 (four) times daily. 04/16/17   Alford Highland, MD  levETIRAcetam (KEPPRA) 750 MG tablet Take 2 tablets (1,500 mg total) by mouth 2 (two) times daily. 12/11/16 04/05/18  Merrily Brittle, MD  losartan (COZAAR) 25 MG tablet TAKE ONE TABLET BY MOUTH DAILY 05/20/17   [name], NP  lurasidone (LATUDA) 40 MG TABS tablet Take 3 tablets (120 mg total) by mouth daily with supper. 05/21/17   [name], [name], MD  metFORMIN (GLUCOPHAGE) 500 MG tablet TAKE 1 TABLET BY MOUTH 2 TIMES DAILY WITH MEALS 03/26/17   [name], NP  nadolol (CORGARD) 20 MG tablet TAKE ONE TABLET BY MOUTH DAILY  05/20/17   [name], NP  [facility] FORMULARY     [provider]  omeprazole (PRILOSEC) 40 MG capsule Take 1 capsule (40 mg total) by mouth 2 (two) times daily. 04/16/17   Alford Highland, MD  spironolactone (ALDACTONE) 50 MG tablet Take 1 tablet (50 mg total) by mouth 2 (two) times daily. 03/02/17   [name], NP  sucralfate (CARAFATE) 1 g tablet Take 1 tablet by mouth 4 (four) times daily.    [provider]  thiamine (VITAMIN B-1) 100 MG tablet Take 100 mg by mouth daily.    [provider]  traZODone (DESYREL) 100 MG tablet Take 1 tablet (100 mg total) by mouth at bedtime as needed for sleep. 03/24/17   [name], [name], MD  XIFAXAN 550 MG TABS tablet TAKE 1 TABLET (550 MG TOTAL) BY MOUTH TWO TIMES DAILY. 05/11/17   [name], NP    Allergies  Allergen Reactions  . Tramadol Itching    Family History  Problem Relation Age of Onset  . Heart disease Mother   . Hypertension Mother   . Hyperlipidemia Mother   . Stroke Father   . Heart attack Father   . Hypertension Father   . Heart disease Father   . Alcohol abuse Father   .  Heart disease Brother     Social History Social History  Substance Use Topics  . Smoking status: Never Smoker  . Smokeless tobacco: Never Used  . Alcohol use 0.0 oz/week     Comment: last drink 1 days ago (noted: 05/30/2017)    Review of Systems  Constitutional: Negative for fever. Eyes: Negative for visual changes. ENT: Negative for sore throat. Cardiovascular: Negative for chest pain. Respiratory: Negative for shortness of breath. Gastrointestinal: Negative for abdominal pain, vomiting and diarrhea. Genitourinary: Negative for dysuria. Musculoskeletal: Negative for back pain. Skin: Negative for rash. Neurological: Negative for headache.  ____________________________________________   PHYSICAL EXAM:  VITAL SIGNS: ED Triage Vitals  Enc Vitals Group     BP 05/30/17 1201 122/80     Pulse  Rate 05/30/17 1201 82     Resp 05/30/17 1201 18     Temp 05/30/17 1201 97.8 F (36.6 C)     Temp Source 05/30/17 1201 Oral     SpO2 05/30/17 1201 96 %     Weight 05/30/17 1204 (!) 329 lb (149.2 kg)     Height 05/30/17 1204 6\' 3"  (1.905 m)     Head Circumference --      Peak Flow --      Pain Score 05/30/17 1201 7     Pain Loc --      Pain Edu? --      Excl. in GC? --      Constitutional: Alert and cooperative. Well appearing and in no distress. HEENT   Head: Normocephalic and atraumatic.      Eyes: Conjunctivae are normal. Pupils equal and round.       Ears:         Nose: No congestion/rhinnorhea.   Mouth/Throat: Mucous membranes are moist.   Neck: No stridor. Cardiovascular/Chest: Normal rate, regular rhythm.  No murmurs, rubs, or gallops. Respiratory: Normal respiratory effort without tachypnea nor retractions. Breath sounds are clear and equal bilaterally. No wheezes/rales/rhonchi. Gastrointestinal: Soft. No distention, no guarding, no rebound. Nontender. Obese Genitourinary/rectal:Deferred Musculoskeletal: Nontender with normal range of motion in all extremities. No joint effusions.  No lower extremity tenderness.  No edema. Neurologic:  asterixis present.  No facial droop.  Normal speech and language. No gross or focal neurologic deficits are appreciated. Skin:  Skin is warm, dry and intact. No rash noted. Psychiatric: flat affect. No depressed mood..   ____________________________________________  LABS (pertinent positives/negatives)  Labs Reviewed  CBC - Abnormal; Notable for the following:       Result Value   RBC 3.81 (*)    Hemoglobin 11.6 (*)    HCT 34.1 (*)    RDW 15.6 (*)    Platelets 100 (*)    All other components within normal limits  COMPREHENSIVE METABOLIC PANEL - Abnormal; Notable for the following:    Glucose, Bld 124 (*)    AST 54 (*)    All other components within normal limits  AMMONIA - Abnormal; Notable for the following:     Ammonia 92 (*)    All other components within normal limits  TROPONIN I    ____________________________________________    EKG I, Governor Rooksebecca Taelon Bendorf, MD, the attending physician have personally viewed and interpreted all ECGs.  80 bpm. Normal sinus rhythm. Nonspecific intraventricular conduction delay. Nonspecific ST and T-wave ____________________________________________  RADIOLOGY All Xrays were viewed by me. Imaging interpreted by Radiologist.  none __________________________________________  PROCEDURES  Procedure(s) performed: None  Critical Care performed: None  ____________________________________________   ED COURSE /  ASSESSMENT AND PLAN  Pertinent labs & imaging results that were available during my care of the patient were reviewed by me and considered in my medical decision making (see chart for details).   Patient presenting with complaint of dizziness up prior to a fall. He does not appear to be having any symptoms or signs on exam of traumatic injury. Denies striking his head. He does have asterixis, and history of elevated ammonia and is reporting noncompliance with lactulose.  Reevaluation around 1:45 PM. Patient feels much better, no ongoing pain. Patient is quite clear with me that he's been noncompliant with his lactulose could he does not like the diarrhea. However he states that given the elevated ammonia he is willing to take his medication which she has available to him at home. He does live with his mother. His mother will pick him up today.  He does have a history of bleeding esophageal varices, but reports no black stools, bloody stools, vomiting blood, and hemoglobin is stable from prior.  He is not complaining of head injury or other neurologic complaints or not suspicious for a traumatic head injury, nor intracranial emergency.  No cardiac symptoms, ecg nonspecific, I did add on troponin which is reassuring.  Although he does have chronic medical  illness and is apparently a hospice for end-stage liver disease, I do think he is stable for discharge now.  he was a little tachycardic with standing, consistent with orthostatic potentially continuing to his symptoms. I have discussed with himself and his mom to drink plenty of fluids and there are comfortable with managing this at home. I think this is reasonable.     CONSULTATIONS:  none   Patient / Family / Caregiver informed of clinical course, medical decision-making process, and agree with plan.   I discussed return, follow-up instructions, and discharge instructions with patient and/or family.  Discharge Instructions : you were evaluated for dizziness and fall, and no serious injury is suspected. No certain cause was found for the dizziness, but states that that may be related to elevated ammonia. As we discussed, please take your prescription lactulose and other medications.  Return to the emergency room immediately for any worsening condition including vomiting blood, black or bloody stools, nor worsening chest pain or abdominal pain, confusion altered mental status, or any other symptoms concerning to you.  ___________________________________________   FINAL CLINICAL IMPRESSION(S) / ED DIAGNOSES   Final diagnoses:  Hyperammonemia (HCC)  Dizziness              Note: This dictation was prepared with Dragon dictation. Any transcriptional errors that result from this process are unintentional    Governor Rooks, MD 05/30/17 1428

## 2017-06-01 ENCOUNTER — Ambulatory Visit (INDEPENDENT_AMBULATORY_CARE_PROVIDER_SITE_OTHER): Payer: Medicare Other | Admitting: Psychiatry

## 2017-06-01 ENCOUNTER — Encounter: Payer: Self-pay | Admitting: Psychiatry

## 2017-06-01 VITALS — BP 131/77 | HR 75 | Temp 98.0°F | Wt 330.8 lb

## 2017-06-01 DIAGNOSIS — F200 Paranoid schizophrenia: Secondary | ICD-10-CM

## 2017-06-01 DIAGNOSIS — F1010 Alcohol abuse, uncomplicated: Secondary | ICD-10-CM

## 2017-06-01 MED ORDER — PERPHENAZINE 8 MG PO TABS: 8.0000 mg | ORAL_TABLET | Freq: Every day | ORAL | 1 refills | Status: DC

## 2017-06-01 MED ORDER — CITALOPRAM HYDROBROMIDE 20 MG PO TABS: 40.0000 mg | ORAL_TABLET | Freq: Every day | ORAL | 1 refills | Status: DC
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
## 2017-06-04 ENCOUNTER — Other Ambulatory Visit: Payer: Self-pay | Admitting: Psychiatry

## 2017-06-04 ENCOUNTER — Ambulatory Visit: Payer: Medicare Other | Admitting: Psychiatry

## 2017-06-04 ENCOUNTER — Telehealth: Payer: Self-pay | Admitting: Psychiatry

## 2017-06-04 MED ORDER — TEMAZEPAM 30 MG PO CAPS: 30.0000 mg | ORAL_CAPSULE | Freq: Every evening | ORAL | 0 refills | Status: DC | PRN

## 2017-06-04 NOTE — Telephone Encounter (Signed)
I received a message from our office this morning that the patient called stating that he was having suicidal thoughts and wanted to speak with me. I returned his call and spoke with him. This morning he reported that he was having suicidal thoughts. He was quite tearful and sounded very down. There did not appear to be anything I could immediately do over the telephone to safely address this so I told him to come to the emergency room. Initially he was has attends because I had mentioned to him previously that we were unlikely to be able to admit him to the hospital because of his need for oxygen. Nevertheless I reassured him that we would do everything we could to take care of him and tried to find a safe solution in the emergency room. Patient did not arrive in the emergency room over the next several hours so I called him again at home. This afternoon he tells me that he is feeling much better. His mother made him his favorite meal of chili dogs and cherry pie which has improved his mood quite a bit. He says while he is still sad he does not have any suicidal thoughts and does not believe that there is any risk that he will harm himself. He does tell me however that he has not slept in 3 days. I agreed to try adding some medication to help him sleep given in the extremity of his complaint.  I called in to [facility] Restoril 30 mg 7 days only. I strongly advised the patient not to take it if he was drinking at the same time because of the danger of overdose. He agrees to the plan and we will follow-up outpatient as usual or he can call back if needed.

## 2017-06-04 NOTE — Telephone Encounter (Signed)
This was all manage. See my note

## 2017-06-04 NOTE — Telephone Encounter (Signed)
Medication management - Message left for patient on her voicemail to call back to follow up on his call from earlier today and to direct to emergency services for follow up as needed.

## 2017-06-08 ENCOUNTER — Encounter: Payer: Self-pay | Admitting: Emergency Medicine

## 2017-06-08 ENCOUNTER — Emergency Department
Admission: EM | Admit: 2017-06-08 | Discharge: 2017-06-09 | Disposition: A | Discharge status: Home / Self Care | Payer: Medicare Other | Attending: Emergency Medicine | Admitting: Emergency Medicine

## 2017-06-08 ENCOUNTER — Ambulatory Visit: Payer: Medicare Other | Admitting: Licensed Clinical Social Worker

## 2017-06-08 DIAGNOSIS — E119 Type 2 diabetes mellitus without complications: Secondary | ICD-10-CM

## 2017-06-08 DIAGNOSIS — D649 Anemia, unspecified: Secondary | ICD-10-CM | POA: Diagnosis present

## 2017-06-08 DIAGNOSIS — R440 Auditory hallucinations: Secondary | ICD-10-CM | POA: Insufficient documentation

## 2017-06-08 DIAGNOSIS — D689 Coagulation defect, unspecified: Secondary | ICD-10-CM | POA: Diagnosis present

## 2017-06-08 DIAGNOSIS — I10 Essential (primary) hypertension: Secondary | ICD-10-CM | POA: Diagnosis not present

## 2017-06-08 DIAGNOSIS — F1020 Alcohol dependence, uncomplicated: Secondary | ICD-10-CM | POA: Diagnosis present

## 2017-06-08 DIAGNOSIS — K7290 Hepatic failure, unspecified without coma: Secondary | ICD-10-CM | POA: Insufficient documentation

## 2017-06-08 DIAGNOSIS — F200 Paranoid schizophrenia: Secondary | ICD-10-CM | POA: Diagnosis not present

## 2017-06-08 DIAGNOSIS — J449 Chronic obstructive pulmonary disease, unspecified: Secondary | ICD-10-CM | POA: Insufficient documentation

## 2017-06-08 DIAGNOSIS — F4310 Post-traumatic stress disorder, unspecified: Secondary | ICD-10-CM

## 2017-06-08 DIAGNOSIS — R569 Unspecified convulsions: Secondary | ICD-10-CM

## 2017-06-08 DIAGNOSIS — I8500 Esophageal varices without bleeding: Secondary | ICD-10-CM | POA: Diagnosis present

## 2017-06-08 DIAGNOSIS — K7031 Alcoholic cirrhosis of liver with ascites: Secondary | ICD-10-CM | POA: Diagnosis present

## 2017-06-08 LAB — DIFFERENTIAL
BASOS ABS: 0.1 10*3/uL (ref 0–0.1)
Basophils Relative: 1 %
EOS ABS: 0.1 10*3/uL (ref 0–0.7)
Eosinophils Relative: 2 %
LYMPHS ABS: 1.1 10*3/uL (ref 1.0–3.6)
Lymphocytes Relative: 18 %
MONOS PCT: 8 %
Monocytes Absolute: 0.5 10*3/uL (ref 0.2–1.0)
Neutro Abs: 4.7 10*3/uL (ref 1.4–6.5)
Neutrophils Relative %: 71 %

## 2017-06-08 LAB — COMPREHENSIVE METABOLIC PANEL
ALK PHOS: 100 U/L (ref 38–126)
ALT: 49 U/L (ref 17–63)
ANION GAP: 8 (ref 5–15)
AST: 56 U/L — ABNORMAL HIGH (ref 15–41)
Albumin: 3.9 g/dL (ref 3.5–5.0)
BILIRUBIN TOTAL: 1.5 mg/dL — AB (ref 0.3–1.2)
BUN: 10 mg/dL (ref 6–20)
CALCIUM: 9.1 mg/dL (ref 8.9–10.3)
CO2: 25 mmol/L (ref 22–32)
Chloride: 105 mmol/L (ref 101–111)
Creatinine, Ser: 0.97 mg/dL (ref 0.61–1.24)
GFR calc Af Amer: >60 mL/min (ref >60)
Glucose, Bld: 94 mg/dL (ref 65–99)
Potassium: 4.5 mmol/L (ref 3.5–5.1)
SODIUM: 138 mmol/L (ref 135–145)
Total Protein: 8.1 g/dL (ref 6.5–8.1)

## 2017-06-08 LAB — SALICYLATE LEVEL

## 2017-06-08 LAB — ETHANOL

## 2017-06-08 LAB — CBC
HCT: 34.4 % — ABNORMAL LOW (ref 40.0–52.0)
Hemoglobin: 11.7 g/dL — ABNORMAL LOW (ref 13.0–18.0)
MCH: 30.5 pg (ref 26.0–34.0)
MCHC: 34.1 g/dL (ref 32.0–36.0)
MCV: 89.4 fL (ref 80.0–100.0)
Platelets: 115 10*3/uL — ABNORMAL LOW (ref 150–440)
RBC: 3.85 MIL/uL — ABNORMAL LOW (ref 4.40–5.90)
RDW: 15.6 % — AB (ref 11.5–14.5)
WBC: 6.6 10*3/uL (ref 3.8–10.6)

## 2017-06-08 LAB — ACETAMINOPHEN LEVEL

## 2017-06-08 LAB — URINE DRUG SCREEN, QUALITATIVE (ARMC ONLY)
Amphetamines, Ur Screen: NOT DETECTED
BARBITURATES, UR SCREEN: NOT DETECTED
Benzodiazepine, Ur Scrn: NOT DETECTED
CANNABINOID 50 NG, UR ~~LOC~~: NOT DETECTED
Cocaine Metabolite,Ur ~~LOC~~: NOT DETECTED
MDMA (Ecstasy)Ur Screen: NOT DETECTED
METHADONE SCREEN, URINE: NOT DETECTED
Opiate, Ur Screen: NOT DETECTED
Phencyclidine (PCP) Ur S: NOT DETECTED
TRICYCLIC, UR SCREEN: NOT DETECTED

## 2017-06-08 LAB — AMMONIA: Ammonia: 67 umol/L — ABNORMAL HIGH (ref 9–35)

## 2017-06-08 MED ORDER — NADOLOL 20 MG PO TABS
20.0000 mg | ORAL_TABLET | Freq: Every day | ORAL | Status: DC
  Administered 2017-06-08 – 2017-06-09 (×2): 20 mg via ORAL
  Filled 2017-06-08 (×3): qty 1

## 2017-06-08 MED ORDER — LOSARTAN POTASSIUM 50 MG PO TABS
25.0000 mg | ORAL_TABLET | Freq: Every day | ORAL | Status: DC
  Administered 2017-06-08 – 2017-06-09 (×2): 25 mg via ORAL
  Filled 2017-06-08: qty 1
  Filled 2017-06-08: qty 0.5
  Filled 2017-06-08: qty 1

## 2017-06-08 MED ORDER — LEVETIRACETAM 500 MG PO TABS
1500.0000 mg | ORAL_TABLET | Freq: Two times a day (BID) | ORAL | Status: DC
  Administered 2017-06-08 – 2017-06-09 (×2): 1500 mg via ORAL
  Filled 2017-06-08 (×2): qty 3

## 2017-06-08 MED ORDER — SUCRALFATE 1 G PO TABS
1.0000 g | ORAL_TABLET | Freq: Three times a day (TID) | ORAL | Status: DC
  Administered 2017-06-08 – 2017-06-09 (×2): 1 g via ORAL
  Filled 2017-06-08 (×2): qty 1

## 2017-06-08 MED ORDER — RIFAXIMIN 550 MG PO TABS
550.0000 mg | ORAL_TABLET | Freq: Two times a day (BID) | ORAL | Status: DC
  Administered 2017-06-08 – 2017-06-09 (×2): 550 mg via ORAL
  Filled 2017-06-08 (×3): qty 1

## 2017-06-08 MED ORDER — LACTULOSE 10 GM/15ML PO SOLN
45.0000 g | Freq: Three times a day (TID) | ORAL | Status: DC
  Administered 2017-06-08 – 2017-06-09 (×3): 45 g via ORAL
  Filled 2017-06-08 (×3): qty 90

## 2017-06-08 MED ORDER — VITAMIN B-1 100 MG PO TABS
100.0000 mg | ORAL_TABLET | Freq: Every day | ORAL | Status: DC
  Administered 2017-06-08 – 2017-06-09 (×2): 100 mg via ORAL
  Filled 2017-06-08 (×2): qty 1

## 2017-06-08 MED ORDER — ALBUTEROL SULFATE HFA 108 (90 BASE) MCG/ACT IN AERS
2.0000 | INHALATION_SPRAY | RESPIRATORY_TRACT | Status: DC | PRN
  Administered 2017-06-09: 2 via RESPIRATORY_TRACT
  Filled 2017-06-08: qty 6.7

## 2017-06-08 MED ORDER — ACAMPROSATE CALCIUM 333 MG PO TBEC
666.0000 mg | DELAYED_RELEASE_TABLET | Freq: Three times a day (TID) | ORAL | Status: DC
  Administered 2017-06-09: 666 mg via ORAL
  Filled 2017-06-08 (×2): qty 2

## 2017-06-08 MED ORDER — FOLIC ACID 1 MG PO TABS
1.0000 mg | ORAL_TABLET | Freq: Every day | ORAL | Status: DC
  Administered 2017-06-08 – 2017-06-09 (×2): 1 mg via ORAL
  Filled 2017-06-08 (×2): qty 1

## 2017-06-08 MED ORDER — FERROUS SULFATE 325 (65 FE) MG PO TABS
325.0000 mg | ORAL_TABLET | Freq: Every day | ORAL | Status: DC
  Administered 2017-06-09: 325 mg via ORAL
  Filled 2017-06-08: qty 1

## 2017-06-08 MED ORDER — PANTOPRAZOLE SODIUM 40 MG PO TBEC
40.0000 mg | DELAYED_RELEASE_TABLET | Freq: Every day | ORAL | Status: DC
  Administered 2017-06-08 – 2017-06-09 (×2): 40 mg via ORAL
  Filled 2017-06-08 (×2): qty 1

## 2017-06-08 MED ORDER — METFORMIN HCL 500 MG PO TABS
500.0000 mg | ORAL_TABLET | Freq: Two times a day (BID) | ORAL | Status: DC
  Administered 2017-06-08 – 2017-06-09 (×2): 500 mg via ORAL
  Filled 2017-06-08 (×2): qty 1

## 2017-06-08 MED ORDER — SPIRONOLACTONE 25 MG PO TABS
50.0000 mg | ORAL_TABLET | Freq: Two times a day (BID) | ORAL | Status: DC
  Administered 2017-06-08 – 2017-06-09 (×2): 50 mg via ORAL
  Filled 2017-06-08: qty 1
  Filled 2017-06-08 (×2): qty 2

## 2017-06-08 MED ORDER — CLOZAPINE 25 MG PO TABS: 25.0000 mg | ORAL_TABLET | Freq: Every day | ORAL | Status: DC

## 2017-06-08 NOTE — ED Notes (Signed)
Given food tray.

## 2017-06-08 NOTE — ED Notes (Signed)
Dr clapacs at bedside 

## 2017-06-08 NOTE — ED Notes (Signed)
Pt laying in bed. NAD currently.  Waiting on psych consult.

## 2017-06-08 NOTE — ED Provider Notes (Addendum)
[facility] Emergency Department Provider Note       Time seen: ----------------------------------------- 10:42 AM on 06/08/2017 -----------------------------------------     I have reviewed the triage vital signs and the nursing notes.   HISTORY   Chief Complaint Hallucinations    HPI Howard [name] is a 36 y.o. male who presents to the ED for hallucinations. Patient reports to the ER that he is hearing voices over the past week. He denies suicidal or homicidal ideation. He was told to come be admitted by [name] last week. Patient states his been using alcohol recently. he denies any physical complaints at this time.   Past Medical History:  Diagnosis Date  . Alcoholic cirrhosis of liver with ascites (HCC)   . Alcoholism (HCC)   . Anemia   . Atrial fibrillation (HCC)   . COPD (chronic obstructive pulmonary disease) (HCC)   . Depression   . Diabetes mellitus, type II (HCC)   . Esophageal varices (HCC)   . Heart disease    irregular heart beat (palpitations) and heart murmur  . Hyperlipemia   . Hypertension   . Liver disease   . Multiple thyroid nodules   . Portal hypertensive gastropathy (HCC)   . Schizophrenia (HCC)   . Seizures [facility])     Patient Active Problem List   Diagnosis Date Noted  . Inflamed external hemorrhoid 05/13/2017  . End stage liver disease (HCC) 04/24/2017  . Esophageal varices without bleeding (HCC)   . Hematemesis 04/05/2017  . Upper GI bleed   . Iron deficiency anemia due to chronic blood loss 02/16/2017  . Nausea 01/18/2017  . Respiratory failure with hypoxia (HCC) 12/14/2016  . Seizures (HCC) 12/12/2016  . DNR (do not resuscitate) discussion 08/11/2016  . Muscle weakness (generalized)   . GI bleed 08/07/2016  . OSA (obstructive sleep apnea) 04/29/2016  . Anemia 04/29/2016  . Thrombocytopenia (HCC) 04/29/2016  . Coagulopathy (HCC) 04/29/2016  . Obesity 04/29/2016  . Controlled type 2 diabetes mellitus  without complication (HCC) 01/15/2016  . Essential (primary) hypertension 12/11/2015  . Pure hypercholesterolemia 12/11/2015  . Hepatic encephalopathy (HCC) 10/16/2015  . 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 Elevated transaminase level 07/04/2015  . Alcohol abuse 05/31/2015  . Paranoid schizophrenia (HCC) 03/20/2015    Past Surgical History:  Procedure Laterality Date  . ESOPHAGOGASTRODUODENOSCOPY N/A 10/05/2015   Procedure: ESOPHAGOGASTRODUODENOSCOPY (EGD);  Surgeon: Elnita Maxwell, MD;  Location: [facility] ENDOSCOPY;  Service: Endoscopy;  Laterality: N/A;  . ESOPHAGOGASTRODUODENOSCOPY (EGD) WITH PROPOFOL N/A 08/03/2015   Procedure: ESOPHAGOGASTRODUODENOSCOPY (EGD) WITH PROPOFOL;  Surgeon: Elnita Maxwell, MD;  Location: [facility] ENDOSCOPY;  Service: Endoscopy;  Laterality: N/A;  . ESOPHAGOGASTRODUODENOSCOPY (EGD) WITH PROPOFOL N/A 08/31/2015   Procedure: ESOPHAGOGASTRODUODENOSCOPY (EGD) WITH PROPOFOL;  Surgeon: Elnita Maxwell, MD;  Location: [facility] ENDOSCOPY;  Service: Endoscopy;  Laterality: N/A;  . ESOPHAGOGASTRODUODENOSCOPY (EGD) WITH PROPOFOL N/A 04/04/2016   Procedure: ESOPHAGOGASTRODUODENOSCOPY (EGD) WITH PROPOFOL;  Surgeon: Scot Jun, MD;  Location: [facility] ENDOSCOPY;  Service: Endoscopy;  Laterality: N/A;  . ESOPHAGOGASTRODUODENOSCOPY (EGD) WITH PROPOFOL N/A 11/13/2016   Procedure: ESOPHAGOGASTRODUODENOSCOPY (EGD) WITH PROPOFOL;  Surgeon: Wyline Mood, MD;  Location: [facility] ENDOSCOPY;  Service: Endoscopy;  Laterality: N/A;  . ESOPHAGOGASTRODUODENOSCOPY (EGD) WITH PROPOFOL N/A 04/07/2017   Procedure: ESOPHAGOGASTRODUODENOSCOPY (EGD) WITH PROPOFOL;  Surgeon: Midge Minium, MD;  Location: [facility] ENDOSCOPY;  Service: Endoscopy;  Laterality: N/A;  . NO PAST SURGERIES      Allergies Tramadol  Social History Social History  Substance Use Topics  . Smoking status: Never Smoker  . Smokeless tobacco: Never Used  . Alcohol use 0.0 oz/week     Comment: last drink  1 days ago (noted: 05/30/2017)    Review of Systems Constitutional: Negative for fever. Cardiovascular: Negative for chest pain. Respiratory: Negative for shortness of breath. Gastrointestinal: Negative for abdominal pain, vomiting and diarrhea. Genitourinary: Negative for dysuria. Musculoskeletal: Negative for back pain. Skin: Negative for rash. Neurological: Negative for headaches, focal weakness or numbness. psychiatric:positive for hallucinations, alcohol abuse  All systems negative/normal/unremarkable except as stated in the HPI  ____________________________________________   PHYSICAL EXAM:  VITAL SIGNS: ED Triage Vitals  Enc Vitals Group     BP 06/08/17 1023 (!) 148/73     Pulse Rate 06/08/17 1023 67     Resp 06/08/17 1023 16     Temp 06/08/17 1023 98.6 F (37 C)     Temp Source 06/08/17 1023 Oral     SpO2 06/08/17 1023 96 %     Weight 06/08/17 1024 (!) 330 lb (149.7 kg)     Height 06/08/17 1024  (1.905 m)     Head Circumference --      Peak Flow --      Pain Score 06/08/17 1025 0     Pain Loc --      Pain Edu? --      Excl. in GC? --    Constitutional: Alert and oriented.tearful, anxious Eyes: Conjunctivae are normal. Normal extraocular movements. ENT   Head: Normocephalic and atraumatic.   Nose: No congestion/rhinnorhea.   Mouth/Throat: Mucous membranes are moist.   Neck: No stridor. Cardiovascular: Normal rate, regular rhythm. No murmurs, rubs, or gallops. Respiratory: Normal respiratory effort without tachypnea nor retractions. Breath sounds are clear and equal bilaterally. No wheezes/rales/rhonchi. Gastrointestinal: Soft and nontender. Normal bowel sounds Musculoskeletal: Nontender with normal range of motion in extremities. No lower extremity tenderness nor edema. Neurologic:  Normal speech and language. No gross focal neurologic deficits are appreciated. resting tremors noted Skin:  Skin is warm, dry and intact. No rash  noted. Psychiatric: depressed mood ____________________________________________  ED COURSE:  Pertinent labs & imaging results that were available during my care of the patient were reviewed by me and considered in my medical decision making (see chart for details). Patient presents for hallucinations and alcohol abuse, we will assess with labs and imaging as indicated.   Procedures ____________________________________________   LABS (pertinent positives/negatives)  Labs Reviewed  COMPREHENSIVE METABOLIC PANEL - Abnormal; Notable for the following:       Result Value   AST 56 (*)    Total Bilirubin 1.5 (*)    All other components within normal limits  ACETAMINOPHEN LEVEL - Abnormal; Notable for the following:    Acetaminophen (Tylenol), Serum <10 (*)    All other components within normal limits  CBC - Abnormal; Notable for the following:    RBC 3.85 (*)    Hemoglobin 11.7 (*)    HCT 34.4 (*)    RDW 15.6 (*)    Platelets 115 (*)    All other components within normal limits  AMMONIA - Abnormal; Notable for the following:    Ammonia 67 (*)    All other components within normal limits  ETHANOL  SALICYLATE LEVEL  URINE DRUG SCREEN, QUALITATIVE ([facility] ONLY)   ____________________________________________  FINAL ASSESSMENT AND PLAN  Auditory hallucinations, schizophrenia, alcohol abuse   Plan: Patient's labs were dictated above. Patient had presented for hallucinations with a history  of schizophrenia as well as recent alcohol abuse. We will assess basic labs and discuss with psychiatry for disposition.   Emily Filbert, MD   Note: This note was generated in part or whole with voice recognition software. Voice recognition is usually quite accurate but there are transcription errors that can and very often do occur. I apologize for any typographical errors that were not detected and corrected.     Emily Filbert, MD 06/08/17 1043    Emily Filbert,  MD 06/08/17 1100    Emily Filbert, MD 06/08/17 1215

## 2017-06-08 NOTE — ED Notes (Signed)
Ambulatory to bathroom.
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
## 2017-06-08 NOTE — Progress Notes (Signed)
Clozapine Monitoring:  CBC with differential obtained on 06/08/17. ANC 4700.   Attempted to enter labs into clozapine registry - patient is not yet registered for [facility]S program. Cannot dispense at this time. Spoke with prescribing MD who is aware and will register patient.  Cindi Carbon, PharmD 06/08/17 8:03 PM

## 2017-06-08 NOTE — ED Provider Notes (Signed)
ED ECG REPORT I, Howard [name], the attending physician, personally viewed and interpreted this ECG.  NSR, rate of 79, normal intervals, normal axis, no ST elevations or depressions.   Howard Sickle, MD 06/08/17 [phone]

## 2017-06-08 NOTE — ED Triage Notes (Signed)
Pt to ed with c/o hearing voices for over the past week, denies SI, denies HI, was told to come to be admitted by Dr Toni Amend last week.  Pt states he has been using alcohol recently.

## 2017-06-09 ENCOUNTER — Inpatient Hospital Stay
Admission: AD | Admit: 2017-06-09 | Discharge: 2017-06-16 | DRG: 885 | Disposition: A | Discharge status: Home / Self Care | Payer: Medicare Other | Source: Intra-hospital | Attending: Psychiatry | Admitting: Psychiatry

## 2017-06-09 ENCOUNTER — Encounter: Payer: Self-pay | Admitting: *Deleted

## 2017-06-09 DIAGNOSIS — G4733 Obstructive sleep apnea (adult) (pediatric): Secondary | ICD-10-CM | POA: Diagnosis present

## 2017-06-09 DIAGNOSIS — R45851 Suicidal ideations: Secondary | ICD-10-CM | POA: Diagnosis present

## 2017-06-09 DIAGNOSIS — Z885 Allergy status to narcotic agent status: Secondary | ICD-10-CM

## 2017-06-09 DIAGNOSIS — E119 Type 2 diabetes mellitus without complications: Secondary | ICD-10-CM | POA: Diagnosis present

## 2017-06-09 DIAGNOSIS — Z818 Family history of other mental and behavioral disorders: Secondary | ICD-10-CM | POA: Diagnosis not present

## 2017-06-09 DIAGNOSIS — Z811 Family history of alcohol abuse and dependence: Secondary | ICD-10-CM | POA: Diagnosis not present

## 2017-06-09 DIAGNOSIS — F329 Major depressive disorder, single episode, unspecified: Secondary | ICD-10-CM | POA: Diagnosis present

## 2017-06-09 DIAGNOSIS — F200 Paranoid schizophrenia: Principal | ICD-10-CM | POA: Diagnosis present

## 2017-06-09 DIAGNOSIS — F4310 Post-traumatic stress disorder, unspecified: Secondary | ICD-10-CM | POA: Diagnosis present

## 2017-06-09 DIAGNOSIS — F1010 Alcohol abuse, uncomplicated: Secondary | ICD-10-CM | POA: Diagnosis present

## 2017-06-09 DIAGNOSIS — Z62810 Personal history of physical and sexual abuse in childhood: Secondary | ICD-10-CM | POA: Diagnosis present

## 2017-06-09 DIAGNOSIS — Z79899 Other long term (current) drug therapy: Secondary | ICD-10-CM | POA: Diagnosis not present

## 2017-06-09 DIAGNOSIS — E7800 Pure hypercholesterolemia, unspecified: Secondary | ICD-10-CM | POA: Diagnosis present

## 2017-06-09 DIAGNOSIS — E785 Hyperlipidemia, unspecified: Secondary | ICD-10-CM | POA: Diagnosis present

## 2017-06-09 DIAGNOSIS — Z7984 Long term (current) use of oral hypoglycemic drugs: Secondary | ICD-10-CM

## 2017-06-09 DIAGNOSIS — J449 Chronic obstructive pulmonary disease, unspecified: Secondary | ICD-10-CM | POA: Diagnosis present

## 2017-06-09 DIAGNOSIS — I10 Essential (primary) hypertension: Secondary | ICD-10-CM | POA: Diagnosis present

## 2017-06-09 DIAGNOSIS — I4891 Unspecified atrial fibrillation: Secondary | ICD-10-CM | POA: Diagnosis present

## 2017-06-09 DIAGNOSIS — K7031 Alcoholic cirrhosis of liver with ascites: Secondary | ICD-10-CM | POA: Diagnosis present

## 2017-06-09 DIAGNOSIS — R440 Auditory hallucinations: Secondary | ICD-10-CM | POA: Diagnosis not present

## 2017-06-09 DIAGNOSIS — F1020 Alcohol dependence, uncomplicated: Secondary | ICD-10-CM | POA: Diagnosis present

## 2017-06-09 MED ORDER — LEVETIRACETAM 500 MG PO TABS
1500.0000 mg | ORAL_TABLET | Freq: Two times a day (BID) | ORAL | Status: DC
  Administered 2017-06-09 – 2017-06-16 (×14): 1500 mg via ORAL
  Filled 2017-06-09 (×14): qty 3

## 2017-06-09 MED ORDER — RIFAXIMIN 550 MG PO TABS
550.0000 mg | ORAL_TABLET | Freq: Two times a day (BID) | ORAL | Status: DC
  Administered 2017-06-09 – 2017-06-16 (×14): 550 mg via ORAL
  Filled 2017-06-09 (×16): qty 1

## 2017-06-09 MED ORDER — FERROUS SULFATE 325 (65 FE) MG PO TABS
325.0000 mg | ORAL_TABLET | Freq: Every day | ORAL | Status: DC
  Administered 2017-06-10 – 2017-06-16 (×7): 325 mg via ORAL
  Filled 2017-06-09 (×7): qty 1

## 2017-06-09 MED ORDER — FOLIC ACID 1 MG PO TABS
1.0000 mg | ORAL_TABLET | Freq: Every day | ORAL | Status: DC
  Administered 2017-06-09 – 2017-06-16 (×8): 1 mg via ORAL
  Filled 2017-06-09 (×8): qty 1

## 2017-06-09 MED ORDER — METFORMIN HCL 500 MG PO TABS
500.0000 mg | ORAL_TABLET | Freq: Two times a day (BID) | ORAL | Status: DC
  Administered 2017-06-09 – 2017-06-16 (×13): 500 mg via ORAL
  Filled 2017-06-09 (×13): qty 1

## 2017-06-09 MED ORDER — NADOLOL 20 MG PO TABS
20.0000 mg | ORAL_TABLET | Freq: Every day | ORAL | Status: DC
  Administered 2017-06-09 – 2017-06-16 (×8): 20 mg via ORAL
  Filled 2017-06-09 (×8): qty 1

## 2017-06-09 MED ORDER — ADULT MULTIVITAMIN W/MINERALS CH
1.0000 | ORAL_TABLET | Freq: Every day | ORAL | Status: DC
  Administered 2017-06-09 – 2017-06-16 (×8): 1 via ORAL
  Filled 2017-06-09 (×8): qty 1

## 2017-06-09 MED ORDER — ALUM & MAG HYDROXIDE-SIMETH 200-200-20 MG/5ML PO SUSP: 30.0000 mL | ORAL | Status: DC | PRN

## 2017-06-09 MED ORDER — SUCRALFATE 1 G PO TABS
1.0000 g | ORAL_TABLET | Freq: Three times a day (TID) | ORAL | Status: DC
  Administered 2017-06-09 – 2017-06-16 (×28): 1 g via ORAL
  Filled 2017-06-09 (×31): qty 1

## 2017-06-09 MED ORDER — HYDROXYZINE HCL 25 MG PO TABS
25.0000 mg | ORAL_TABLET | Freq: Three times a day (TID) | ORAL | Status: DC | PRN
Start: 2017-06-09 — End: 2017-06-16
  Administered 2017-06-09 – 2017-06-15 (×12): 25 mg via ORAL
  Filled 2017-06-09 (×13): qty 1

## 2017-06-09 MED ORDER — MAGNESIUM HYDROXIDE 400 MG/5ML PO SUSP: 30.0000 mL | Freq: Every day | ORAL | Status: DC | PRN

## 2017-06-09 MED ORDER — VITAMIN B-1 100 MG PO TABS
100.0000 mg | ORAL_TABLET | Freq: Every day | ORAL | Status: DC
  Administered 2017-06-09 – 2017-06-16 (×8): 100 mg via ORAL
  Filled 2017-06-09 (×7): qty 1

## 2017-06-09 MED ORDER — PREMIER PROTEIN SHAKE
11.0000 [oz_av] | Freq: Two times a day (BID) | ORAL | Status: DC
  Administered 2017-06-09 – 2017-06-16 (×13): 11 [oz_av] via ORAL

## 2017-06-09 MED ORDER — PANTOPRAZOLE SODIUM 40 MG PO TBEC
40.0000 mg | DELAYED_RELEASE_TABLET | Freq: Every day | ORAL | Status: DC
  Administered 2017-06-09 – 2017-06-16 (×8): 40 mg via ORAL
  Filled 2017-06-09 (×8): qty 1

## 2017-06-09 MED ORDER — ALBUTEROL SULFATE HFA 108 (90 BASE) MCG/ACT IN AERS
2.0000 | INHALATION_SPRAY | RESPIRATORY_TRACT | Status: DC | PRN
  Filled 2017-06-09: qty 6.7

## 2017-06-09 MED ORDER — CLOZAPINE 25 MG PO TABS
25.0000 mg | ORAL_TABLET | Freq: Every day | ORAL | Status: DC
  Administered 2017-06-09: 25 mg via ORAL
  Filled 2017-06-09: qty 1

## 2017-06-09 MED ORDER — LACTULOSE 10 GM/15ML PO SOLN
45.0000 g | Freq: Three times a day (TID) | ORAL | Status: DC
  Administered 2017-06-09 – 2017-06-16 (×18): 45 g via ORAL
  Filled 2017-06-09 (×17): qty 90

## 2017-06-09 MED ORDER — ACETAMINOPHEN 325 MG PO TABS
650.0000 mg | ORAL_TABLET | Freq: Four times a day (QID) | ORAL | Status: DC | PRN
  Administered 2017-06-10: 650 mg via ORAL
  Filled 2017-06-09: qty 2

## 2017-06-09 MED ORDER — ACAMPROSATE CALCIUM 333 MG PO TBEC
666.0000 mg | DELAYED_RELEASE_TABLET | Freq: Three times a day (TID) | ORAL | Status: DC
  Administered 2017-06-09 – 2017-06-16 (×21): 666 mg via ORAL
  Filled 2017-06-09 (×24): qty 2

## 2017-06-09 MED ORDER — LOSARTAN POTASSIUM 25 MG PO TABS
25.0000 mg | ORAL_TABLET | Freq: Every day | ORAL | Status: DC
  Administered 2017-06-09 – 2017-06-16 (×8): 25 mg via ORAL
  Filled 2017-06-09 (×8): qty 1

## 2017-06-09 NOTE — ED Notes (Addendum)
BEHAVIORAL HEALTH ROUNDING Patient sleeping: No. Patient alert and oriented: yes Behavior appropriate: Yes.  ; If no, describe:  Nutrition and fluids offered: yes Toileting and hygiene offered: Yes  Sitter present: q15 minute observations and security  monitoring Law enforcement present: Yes  ODS  

## 2017-06-09 NOTE — BH Assessment (Signed)
Patient is to be admitted to [facility] by [name].  Attending Physician will be Dr. Jennet Maduro.   Patient has been assigned to room 322, by [facility] Charge Nurse [place].   ER staff is aware of the admission Misty Stanley, ER Sect.; Amy Patient's Nurse & Vikki Ports, Patient Access).

## 2017-06-09 NOTE — ED Notes (Signed)
Am meds administered as ordered  - assessment completed  He denies pain  -

## 2017-06-09 NOTE — ED Provider Notes (Signed)
-----------------------------------------   5:45 AM on 06/09/2017 -----------------------------------------   Blood pressure 138/80, pulse 70, temperature 97.9 F (36.6 C), temperature source Oral, resp. rate 20, height  (1.905 m), weight (!) 149.7 kg (330 lb), SpO2 96 %.  The patient had no acute events since last update.  patient has been seen and evaluated by psychiatry. The plan is to admit the patient is psychiatric in patient bed once a bed becomes available.     Minna Antis, MD 06/09/17 [phone]

## 2017-06-09 NOTE — Progress Notes (Signed)
36 year old male received from ED in scrubs.  Sad affect. Verbalizes that he is here because he was here voices that were saying mean things about him.  Denies hearing voices now.  Body search and skin assessment performed.  No contraband found.  Psoriasis noted on right outer lower leg and small quarter size patch noted on lower left front of leg.
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
## 2017-06-09 NOTE — BHH Suicide Risk Assessment (Addendum)
[facility] Admission Suicide Risk Assessment   Nursing information obtained from:   review of chart and nursing information discussion with on duty nursing Demographic factors:   patient is male chronically ill and actively drinking at home. Has supportive family and supportive treatment in place. No known family history of suicide Current Mental Status:   patient is calm but reports his mood as depressed. Continues to have frequent auditory hallucinations hopelessness and suicidal ideation Loss Factors:   health continues to decline related to his cirrhosis which has greatly limited his physical and social activity Historical Factors:   no history of past suicide attempts. Long-standing schizophrenia with recent worsening of mood. Risk Reduction Factors:   strong positive relationship with his mother and brother. Positive activities in his life that he still enjoys. Intact outpatient treatment  Total Time spent with patient: 1 hour Principal Problem: Schizophrenia, paranoid (HCC) Diagnosis:   Patient Active Problem List   Diagnosis Date Noted  . Schizophrenia, paranoid (HCC) [F20.0] 06/09/2017  . PTSD (post-traumatic stress disorder) [F43.10] 06/08/2017  . Inflamed external hemorrhoid [K64.4] 05/13/2017  . End stage liver disease (HCC) [K72.90] 04/24/2017  . Esophageal varices without bleeding (HCC) [I85.00]   . Hematemesis [K92.0] 04/05/2017  . Upper GI bleed [K92.2]   . Iron deficiency anemia due to chronic blood loss [D50.0] 02/16/2017  . Nausea [R11.0] 01/18/2017  . Respiratory failure with hypoxia (HCC) [J96.91] 12/14/2016  . Seizures (HCC) [R56.9] 12/12/2016  . DNR (do not resuscitate) discussion [Z71.89] 08/11/2016  . Muscle weakness (generalized) [M62.81]   . GI bleed [K92.2] 08/07/2016  . OSA (obstructive sleep apnea) [G47.33] 04/29/2016  . Anemia [D64.9] 04/29/2016  . Thrombocytopenia (HCC) [D69.6] 04/29/2016  . Coagulopathy (HCC) [D68.9] 04/29/2016  . Obesity [E66.9] 04/29/2016    . Controlled type 2 diabetes mellitus without complication (HCC) [E11.9] 01/15/2016  . Essential (primary) hypertension [I10] 12/11/2015  . Pure hypercholesterolemia [E78.00] 12/11/2015  . Hepatic encephalopathy (HCC) [K72.90] 10/16/2015  . 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 Elevated transaminase level [R74.0] 07/04/2015  . Alcohol abuse [F10.10] 05/31/2015  . Paranoid schizophrenia (HCC) [F20.0] 03/20/2015   Subjective Data: Patient continues to have suicidal thoughts. Does not wish to die but at times feels overwhelmed by hallucinations. Still very depressed and down.  Continued Clinical Symptoms:    The "Alcohol Use Disorders Identification Test", Guidelines for Use in Primary Care, Second Edition.  World Science writer [facility]). Score between 0-7:  no or low risk or alcohol related problems. Score between 8-15:  moderate risk of alcohol related problems. Score between 16-19:  high risk of alcohol related problems. Score 20 or above:  warrants further diagnostic evaluation for alcohol dependence and treatment.   CLINICAL FACTORS:   Depression:   Comorbid alcohol abuse/dependence Hopelessness Alcohol/Substance Abuse/Dependencies Schizophrenia:   Command hallucinatons   Musculoskeletal: Strength & Muscle Tone: decreased Gait & Station: unsteady Patient leans: Front  Psychiatric Specialty Exam: Physical Exam  Nursing note and vitals reviewed. Constitutional: He appears well-developed and well-nourished.  HENT:  Head: Normocephalic and atraumatic.    Eyes: Pupils are equal, round, and reactive to light. Conjunctivae are normal.  Neck: Normal range of motion.  Cardiovascular: Regular rhythm and normal heart sounds.   Respiratory: Effort normal and breath sounds normal. No respiratory distress.  GI: Soft.  Musculoskeletal: Normal range of motion.  Neurological: He is alert.  Skin: Skin is warm and dry.  Psychiatric: Judgment normal.  His affect is blunt. His speech is delayed. He  is slowed. Cognition and memory are impaired. He exhibits a depressed mood. He expresses suicidal ideation. He expresses no suicidal plans.    Review of Systems  Constitutional: Negative.   HENT: Negative.   Eyes: Negative.   Respiratory: Negative.   Cardiovascular: Negative.   Gastrointestinal: Negative.   Musculoskeletal: Negative.   Skin: Negative.   Neurological: Negative.   Psychiatric/Behavioral: Positive for depression, hallucinations, substance abuse and suicidal ideas. Negative for memory loss. The patient is nervous/anxious and has insomnia.     There were no vitals taken for this visit.There is no height or weight on file to calculate BMI.  General Appearance: Casual  Eye Contact:  Fair  Speech:  Slow  Volume:  Decreased  Mood:  Depressed  Affect:  Constricted and Flat  Thought Process:  Goal Directed  Orientation:  Full (Time, Place, and Person)  Thought Content:  Hallucinations: Auditory  Suicidal Thoughts:  Yes.  without intent/plan  Homicidal Thoughts:  No  Memory:  Immediate;   Good Recent;   Fair Remote;   Fair  Judgement:  Fair  Insight:  Fair  Psychomotor Activity:  Decreased  Concentration:  Concentration: Fair  Recall:  [employer] of Knowledge:  Fair  Language:  Fair  Akathisia:  No  Handed:  Right  AIMS (if indicated):     Assets:  Desire for Improvement Housing Social Support  ADL's:  Intact  Cognition:  Impaired,  Mild  Sleep:         COGNITIVE FEATURES THAT CONTRIBUTE TO RISK:  Thought constriction (tunnel vision)    SUICIDE RISK:   Moderate:  Frequent suicidal ideation with limited intensity, and duration, some specificity in terms of plans, no associated intent, good self-control, limited dysphoria/symptomatology, some risk factors present, and identifiable protective factors, including available and accessible social support.  PLAN OF CARE: Patient being admitted to the psychiatric unit.  Medications adjustments being considered and implemented. Daily assessment and involvement in appropriate group therapy and treatment for both alcohol abuse and schizophrenia.  I certify that inpatient services furnished can reasonably be expected to improve the patient's condition.   Mordecai Rasmussen, MD 06/09/2017, 11:55 AM

## 2017-06-09 NOTE — BH Assessment (Signed)
Consent papers completed and placed on pt's chart.

## 2017-06-09 NOTE — Progress Notes (Signed)
Clozapine Monitoring:  CBC with differential obtained on 06/08/17. ANC 4700.   Labs entered into clozapine [facility] registry. Per registry, patient is eligible to receive clozapine with weekly lab monitoring. Will order another CBC w/diff on 06/15/17.  Cindi Carbon, PharmD, BCPS 06/09/17 12:38 PM

## 2017-06-09 NOTE — ED Notes (Signed)
PT  WENT TO  [facility]  AT  [facility]  ALL  PAPERWORK  SENT  WITH  NURSE

## 2017-06-09 NOTE — ED Notes (Signed)

## 2017-06-09 NOTE — ED Notes (Signed)
Patient observed lying in bed with eyes closed  Even, unlabored respirations observed   NAD pt appears to be sleeping  I will continue to monitor along with every 15 minute visual observations and ongoing security monitoring    

## 2017-06-09 NOTE — BH Assessment (Signed)
EDP Dr.Stafford informed of pt' admission to BMU.  Pt BMU attending physician has been changed from Dr.Pucilowska to Dr.[name]. Clinician attempting to reach pt access so change may be entered in system.

## 2017-06-09 NOTE — ED Notes (Signed)
Patient continues to sleep soundly without any incidents.

## 2017-06-09 NOTE — ED Notes (Signed)
Patient up to restroom without difficulty.   

## 2017-06-09 NOTE — BH Assessment (Signed)
Howard [name], pt access states pt was previously pre-admitted. Pt access informed of pt change in BMU assigned physician (Pucilowska to [name]).

## 2017-06-09 NOTE — Progress Notes (Signed)
Initial Nutrition Assessment  DOCUMENTATION CODES:   Morbid obesity  INTERVENTION:   Pt at high risk for thiamine, B-12, and folic acid deficiency    Premier Protein BID, each supplement provides 160kcal and 30g protein.   MVI  NUTRITION DIAGNOSIS:   Increased nutrient needs related to chronic illness (ETOH abuse, cirrhosis) as evidenced by increased estimated needs from protein.  GOAL:   Patient will meet greater than or equal to 90% of their needs  MONITOR:   PO intake, Supplement acceptance, Labs  REASON FOR ASSESSMENT:   Malnutrition Screening Tool    ASSESSMENT:   36 year old man with a history of schizophrenia and alcohol abuse comes into the emergency room with worsening suicidal thoughts. Pt with h/o DM, COPD, cirrhosis from his heavy drinking. Has complications including esophageal varices and chronic coagulopathy. Chronically anemia  Pt eating 50% of meals in hospital. RD suspects poor oral intake pta r/t etoh abuse and paranoid schizophrenia. Pt with 12lb(4%) wt loss in 2 months; this is not significant but worth noting given recent history of increased alcohol abuse. Pt at increased risk for thiamine, B-12, and folic acid deficiency. Pt with chronic anemia. Pt with increased protein needs r/t cirrhosis; RD will order Premier Protein to help pt meet estimated protein needs.      Medications reviewed and include: ferrous sulfate, folic acid, lactulose, metformin, protonix, carafate, thiamine   Labs reviewed: AST 56(H), ammonia 67(H), tbili 1.5(H)   Diet Order:  Diet regular Room service appropriate? Yes; Fluid consistency: Thin  Skin:  Reviewed, no issues  Last BM:  PTA  Height:   Ht Readings from Last 1 Encounters:  06/09/17  (1.905 m)    Weight:   Wt Readings from Last 1 Encounters:  06/09/17 (!) 330 lb (149.7 kg)    Ideal Body Weight:  89 kg  BMI:  Body mass index is 41.25 kg/m.  Estimated Nutritional Needs:   Kcal:   2600-2900kcal/day   Protein:  >150g/day   Fluid:  >2.6L/day   EDUCATION NEEDS:   Education needs no appropriate at this time  Betsey Holiday MS, RD, LDN Pager #[phone] After Hours Pager: [phone]

## 2017-06-09 NOTE — Tx Team (Signed)
Initial Treatment Plan 06/09/2017 3:55 PM Howard [name] ONG:[id number]    PATIENT STRESSORS: Substance abuse   PATIENT STRENGTHS: Active sense of humor Supportive family/friends   PATIENT IDENTIFIED PROBLEMS: "stop hearing voices"                     DISCHARGE CRITERIA:  Improved stabilization in mood, thinking, and/or behavior  PRELIMINARY DISCHARGE PLAN: Return to previous living arrangement  PATIENT/FAMILY INVOLVEMENT: This treatment plan has been presented to and reviewed with the patient, Howard [name], and/or family member, .  The patient and family have been given the opportunity to ask questions and make suggestions.  Elige Radon, RN 06/09/2017, 3:55 PM

## 2017-06-09 NOTE — ED Notes (Signed)
He went to the Br to blow his nose - after he blew his nose his nose began bleeding - pt encouraged to sit with his head back - ice pack provided

## 2017-06-09 NOTE — ED Notes (Signed)
ED Is the patient under IVC or is there intent for IVC: Yes.   Is the patient medically cleared: Yes.   Is there vacancy in the ED BHU: Yes.   Is the population mix appropriate for patient: Yes.   Is the patient awaiting placement in inpatient or outpatient setting:  inpt admission bed assigned - pt may move after 1030  Has the patient had a psychiatric consult: Yes.   Survey of unit performed for contraband, proper placement and condition of furniture, tampering with fixtures in bathroom, shower, and each patient room: Yes.  ; Findings:  APPEARANCE/BEHAVIOR Calm and cooperative NEURO ASSESSMENT Orientation: oriented x4  Denies pain Hallucinations -  Speech: Normal Gait: normal RESPIRATORY ASSESSMENT Even  Unlabored respirations  CARDIOVASCULAR ASSESSMENT Pulses equal   regular rate  Skin warm and dry   GASTROINTESTINAL ASSESSMENT no GI complaint EXTREMITIES Full ROM  PLAN OF CARE Provide calm/safe environment. Vital signs assessed twice daily. ED BHU Assessment once each 12-hour shift. Collaborate with TTS daily or as condition indicates. Assure the ED provider has rounded once each shift. Provide and encourage hygiene. Provide redirection as needed. Assess for escalating behavior; address immediately and inform ED provider.  Assess family dynamic and appropriateness for visitation as needed: Yes.  ; If necessary, describe findings:  Educate the patient/family about BHU procedures/visitation: Yes.  ; If necessary, describe findings:

## 2017-06-09 NOTE — ED Notes (Signed)
Nose bleed has stopped  - pt encouraged to keep me informed if it begins again

## 2017-06-10 LAB — LIPID PANEL
Cholesterol: 199 mg/dL (ref 0–200)
HDL: 37 mg/dL — ABNORMAL LOW (ref >40)
LDL CALC: 143 mg/dL — AB (ref 0–99)
Total CHOL/HDL Ratio: 5.4 RATIO
Triglycerides: 97 mg/dL (ref <150)
VLDL: 19 mg/dL (ref 0–40)

## 2017-06-10 LAB — TSH: TSH: 1.391 u[IU]/mL (ref 0.350–4.500)

## 2017-06-10 LAB — HEMOGLOBIN A1C
HEMOGLOBIN A1C: 5.7 % — AB (ref 4.8–5.6)
Mean Plasma Glucose: 116.89 mg/dL

## 2017-06-10 MED ORDER — IBUPROFEN 100 MG/5ML PO SUSP
800.0000 mg | Freq: Four times a day (QID) | ORAL | Status: DC | PRN
  Filled 2017-06-10: qty 40

## 2017-06-10 MED ORDER — CLOZAPINE 25 MG PO TABS
50.0000 mg | ORAL_TABLET | Freq: Every day | ORAL | Status: DC
  Administered 2017-06-10 – 2017-06-11 (×2): 50 mg via ORAL
  Filled 2017-06-10 (×2): qty 2

## 2017-06-10 NOTE — Progress Notes (Signed)
Please note patient is currently followed by OUTpatient Palliative care team: NP and Social worker. Message left on CSW Greg's desk phone to advise him. Outpatient  team aware of patient's admission. Thank you. Dayna Barker RN, BSN, [facility] [place], [facility] [phone] c

## 2017-06-10 NOTE — [facility] Group Notes (Signed)
[facility] Group Notes:  (Nursing/MHT/Case Management/Adjunct)  Date:  06/10/2017  Time:  1:42 AM  Type of Therapy:  Psychoeducational Skills  Participation Level:  Active  Participation Quality:  Appropriate and Attentive  Affect:  Appropriate  Cognitive:  Appropriate  Insight:  Appropriate  Engagement in Group:  Engaged  Modes of Intervention:  Discussion, Socialization and Support  Summary of Progress/Problems:  Chancy Milroy 06/10/2017, 1:42 AM

## 2017-06-10 NOTE — Progress Notes (Signed)
D: Pt denies SI/HI/AVH. Pt is pleasant and cooperative, his thoughts are organized affect is flat, speech is soft with some delayed response noted.  Pt appears less anxious and he is interacting with peers and staff appropriately.  A: Pt was offered support and encouragement. Pt was given scheduled medications. Pt was encouraged to attend groups. Q 15 minute checks were done for safety.  R:Pt attends groups and interacts well with peers and staff. Pt is taking medication. Pt has no complaints.Pt receptive to treatment and safety maintained on unit.

## 2017-06-10 NOTE — Progress Notes (Signed)
[facility] MD Progress Note  06/10/2017 5:03 PM Howard [name]  MRN:  [id number] Subjective:  Follow-up today for this 36 year old man with a history of schizophrenia alcohol abuse and probably PTSD. On interview today the patient says he slept well last night and that his mood is a little bit better. He denies having active suicidal thoughts today. Still has hallucinations. Tolerated medicine well without any clear side effects. He is participating in groups and interacting with his peers. Principal Problem: Schizophrenia, paranoid (HCC) Diagnosis:   Patient Active Problem List   Diagnosis Date Noted  . Schizophrenia, paranoid (HCC) [F20.0] 06/09/2017  . PTSD (post-traumatic stress disorder) [F43.10] 06/08/2017  . Inflamed external hemorrhoid [K64.4] 05/13/2017  . End stage liver disease (HCC) [K72.90] 04/24/2017  . Esophageal varices without bleeding (HCC) [I85.00]   . Hematemesis [K92.0] 04/05/2017  . Upper GI bleed [K92.2]   . Iron deficiency anemia due to chronic blood loss [D50.0] 02/16/2017  . Nausea [R11.0] 01/18/2017  . Respiratory failure with hypoxia (HCC) [J96.91] 12/14/2016  . Seizures (HCC) [R56.9] 12/12/2016  . DNR (do not resuscitate) discussion [Z71.89] 08/11/2016  . Muscle weakness (generalized) [M62.81]   . GI bleed [K92.2] 08/07/2016  . OSA (obstructive sleep apnea) [G47.33] 04/29/2016  . Anemia [D64.9] 04/29/2016  . Thrombocytopenia (HCC) [D69.6] 04/29/2016  . Coagulopathy (HCC) [D68.9] 04/29/2016  . Obesity [E66.9] 04/29/2016  . Controlled type 2 diabetes mellitus without complication (HCC) [E11.9] 01/15/2016  . Essential (primary) hypertension [I10] 12/11/2015  . Pure hypercholesterolemia [E78.00] 12/11/2015  . Hepatic encephalopathy (HCC) [K72.90] 10/16/2015  . 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 Elevated transaminase level [R74.0] 07/04/2015  . Alcohol abuse [F10.10] 05/31/2015  . Paranoid schizophrenia (HCC) [F20.0] 03/20/2015    Total Time spent with patient: 30 minutes  Past Psychiatric History: Patient has a long history of schizophrenia and a more recent history of alcohol abuse which rapidly progressed to the point of cirrhosis. Past history of suicidality but has been quite a while since he was in the hospital. Has failed several antipsychotic medicines.  Past Medical History:  Past Medical History:  Diagnosis Date  . Alcoholic cirrhosis of liver with ascites (HCC)   . Alcoholism (HCC)   . Anemia   . Atrial fibrillation (HCC)   . COPD (chronic obstructive pulmonary disease) (HCC)   . Depression   . Diabetes mellitus, type II (HCC)   . Esophageal varices (HCC)   . Heart disease    irregular heart beat (palpitations) and heart murmur  . Hyperlipemia   . Hypertension   . Liver disease   . Multiple thyroid nodules   . Portal hypertensive gastropathy (HCC)   . Schizophrenia (HCC)   . Seizures (HCC)     Past Surgical History:  Procedure Laterality Date  . ESOPHAGOGASTRODUODENOSCOPY N/A 10/05/2015   Procedure: ESOPHAGOGASTRODUODENOSCOPY (EGD);  Surgeon: Elnita Maxwell, MD;  Location[facility] ENDOSCOPY;  Service: Endoscopy;  Laterality: N/A;  . ESOPHAGOGASTRODUODENOSCOPY (EGD) WITH PROPOFOL N/A 08/03/2015   Procedure: ESOPHAGOGASTRODUODENOSCOPY (EGD) WITH PROPOFOL;  Surgeon: Elnita Maxwell, MD;  Location: [facility] ENDOSCOPY;  Service: Endoscopy;  Laterality: N/A;  . ESOPHAGOGASTRODUODENOSCOPY (EGD) WITH PROPOFOL N/A 08/31/2015   Procedure: ESOPHAGOGASTRODUODENOSCOPY (EGD) WITH PROPOFOL;  Surgeon: Elnita Maxwell, MD;  Location: [facility] ENDOSCOPY;  Service: Endoscopy;  Laterality: N/A;  . ESOPHAGOGASTRODUODENOSCOPY (EGD) WITH PROPOFOL N/A 04/04/2016   Procedure: ESOPHAGOGASTRODUODENOSCOPY (EGD) WITH PROPOFOL;  Surgeon: Scot Jun, MD;  Location: [facility] ENDOSCOPY;  Service: Endoscopy;  Laterality: N/A;  . ESOPHAGOGASTRODUODENOSCOPY (  EGD) WITH PROPOFOL N/A 11/13/2016   Procedure:  ESOPHAGOGASTRODUODENOSCOPY (EGD) WITH PROPOFOL;  Surgeon: Wyline Mood, MD;  Location: [facility] ENDOSCOPY;  Service: Endoscopy;  Laterality: N/A;  . ESOPHAGOGASTRODUODENOSCOPY (EGD) WITH PROPOFOL N/A 04/07/2017   Procedure: ESOPHAGOGASTRODUODENOSCOPY (EGD) WITH PROPOFOL;  Surgeon: Midge Minium, MD;  Location: [facility] ENDOSCOPY;  Service: Endoscopy;  Laterality: N/A;  . NO PAST SURGERIES     Family History:  Family History  Problem Relation Age of Onset  . Heart disease Mother   . Hypertension Mother   . Hyperlipidemia Mother   . Stroke Father   . Heart attack Father   . Hypertension Father   . Heart disease Father   . Alcohol abuse Father   . Heart disease Brother    Family Psychiatric  History: I believe he does have a relative who also has a major mental health problem and his brother seems to have some disability Social History:  History  Alcohol Use  . 0.0 oz/week    Comment: last drink 1 days ago (noted: 05/30/2017)     History  Drug Use No    Social History   Social History  . Marital status: Single    Spouse name: N/A  . Number of children: N/A  . Years of education: N/A   Occupational History  . disabled    Social History Main Topics  . Smoking status: Never Smoker  . Smokeless tobacco: Never Used  . Alcohol use 0.0 oz/week     Comment: last drink 1 days ago (noted: 05/30/2017)  . Drug use: No  . Sexual activity: No   Other Topics Concern  . None   Social History Narrative   ** Merged History Encounter **       Additional Social History:                         Sleep: Fair  Appetite:  Fair  Current Medications: Current Facility-Administered Medications  Medication Dose Route Frequency Provider Last Rate Last Dose  . acamprosate (CAMPRAL) tablet 666 mg  666 mg Oral TID WC [name], [name], MD   666 mg at 06/10/17 1237  . acetaminophen (TYLENOL) tablet 650 mg  650 mg Oral Q6H PRN [name], [name], MD   650 mg at 06/10/17 1621  . albuterol (PROVENTIL  HFA;VENTOLIN HFA) 108 (90 Base) MCG/ACT inhaler 2 puff  2 puff Inhalation Q4H PRN [name], [name], MD      . alum & mag hydroxide-simeth (MAALOX/MYLANTA) 200-200-20 MG/5ML suspension 30 mL  30 mL Oral Q4H PRN [name], [name], MD      . cloZAPine (CLOZARIL) tablet 25 mg  25 mg Oral QHS [name], [name], MD   25 mg at 06/09/17 2124  . ferrous sulfate tablet 325 mg  325 mg Oral Q breakfast [name], [name], MD   325 mg at 06/10/17 1610  . folic acid (FOLVITE) tablet 1 mg  1 mg Oral Daily [name], [name], MD   1 mg at 06/10/17 9604  . hydrOXYzine (ATARAX/VISTARIL) tablet 25 mg  25 mg Oral TID PRN [name], [name], MD   25 mg at 06/09/17 2124  . ibuprofen (ADVIL,MOTRIN) 100 MG/5ML suspension 800 mg  800 mg Oral Q6H PRN Pucilowska, Jolanta B, MD      . lactulose (CHRONULAC) 10 GM/15ML solution 45 g  45 g Oral TID [name], [name], MD   45 g at 06/10/17 5409  . levETIRAcetam (KEPPRA) tablet 1,500  mg  1,500 mg Oral BID [name], [name], MD   1,500 mg at 06/10/17 0454  . losartan (COZAAR) tablet 25 mg  25 mg Oral Daily [name], [name], MD   25 mg at 06/10/17 0981  . magnesium hydroxide (MILK OF MAGNESIA) suspension 30 mL  30 mL Oral Daily PRN [name], [name], MD      . metFORMIN (GLUCOPHAGE) tablet 500 mg  500 mg Oral BID WC [name], [name], MD   500 mg at 06/10/17 0821  . multivitamin with minerals tablet 1 tablet  1 tablet Oral Daily [name], [name], MD   1 tablet at 06/10/17 1914  . nadolol (CORGARD) tablet 20 mg  20 mg Oral Daily [name], [name], MD   20 mg at 06/10/17 7829  . pantoprazole (PROTONIX) EC tablet 40 mg  40 mg Oral Daily [name], [name], MD   40 mg at 06/10/17 5621  . protein supplement (PREMIER PROTEIN) liquid  11 oz Oral BID BM [name], [name], MD   11 oz at 06/10/17 1400  . rifaximin (XIFAXAN) tablet 550 mg  550 mg Oral BID [name], [name], MD   550 mg at 06/10/17 3086  . sucralfate (CARAFATE) tablet 1 g  1 g Oral TID WC & HS [name], [name], MD   1 g at 06/10/17 1237  . thiamine (VITAMIN  B-1) tablet 100 mg  100 mg Oral Daily [name], [name], MD   100 mg at 06/10/17 5784    Lab Results:  Results for orders placed or performed during the hospital encounter of 06/09/17 (from the past 48 hour(s))  Hemoglobin A1c     Status: Abnormal   Collection Time: 06/10/17  7:00 AM  Result Value Ref Range   Hgb A1c MFr Bld 5.7 (H) 4.8 - 5.6 %    Comment: (NOTE) Pre diabetes:          5.7%-6.4% Diabetes:              >6.4% Glycemic control for   <7.0% adults with diabetes    Mean Plasma Glucose 116.89 mg/dL    Comment: Performed at [facility], [address], [place], [place] [address]  Lipid panel     Status: Abnormal   Collection Time: 06/10/17  7:00 AM  Result Value Ref Range   Cholesterol 199 0 - 200 mg/dL   Triglycerides 97 <528 mg/dL   HDL 37 (L) >41 mg/dL   Total CHOL/HDL Ratio 5.4 RATIO   VLDL 19 0 - 40 mg/dL   LDL Cholesterol 324 (H) 0 - 99 mg/dL    Comment:        Total Cholesterol/HDL:CHD Risk Coronary Heart Disease Risk Table                     Men   Women  1/2 Average Risk   3.4   3.3  Average Risk       5.0   4.4  2 X Average Risk   9.6   7.1  3 X Average Risk  23.4   11.0        Use the calculated Patient Ratio above and the CHD Risk Table to determine the patient's CHD Risk.        ATP III CLASSIFICATION (LDL):  <100     mg/dL   Optimal  401-027  mg/dL   Near or Above  Optimal  130-159  mg/dL   Borderline  161-096  mg/dL   High  >045     mg/dL   Very High   TSH     Status: None   Collection Time: 06/10/17  7:00 AM  Result Value Ref Range   TSH 1.391 0.350 - 4.500 uIU/mL    Comment: Performed by a 3rd Generation assay with a functional sensitivity of <=0.01 uIU/mL.    Blood Alcohol level:  Lab Results  Component Value Date   ETH <5 06/08/2017   ETH <5 04/22/2017    Metabolic Disorder Labs: Lab Results  Component Value Date   HGBA1C 5.7 (H) 06/10/2017   MPG 116.89 06/10/2017   MPG 151 12/12/2016   No results  found for: PROLACTIN Lab Results  Component Value Date   CHOL 199 06/10/2017   TRIG 97 06/10/2017   HDL 37 (L) 06/10/2017   CHOLHDL 5.4 06/10/2017   VLDL 19 06/10/2017   LDLCALC 143 (H) 06/10/2017   LDLCALC 121 (H) 01/19/2017    Physical Findings: AIMS: Facial and Oral Movements Muscles of Facial Expression: None, normal Lips and Perioral Area: Mild Jaw: None, normal Tongue: None, normal,Extremity Movements Upper (arms, wrists, hands, fingers): Moderate Lower (legs, knees, ankles, toes): None, normal, Trunk Movements Neck, shoulders, hips: None, normal, Overall Severity Severity of abnormal movements (highest score from questions above): Severe Incapacitation due to abnormal movements: None, normal Patient's awareness of abnormal movements (rate only patient's report): Aware, no distress, Dental Status Current problems with teeth and/or dentures?: Yes Does patient usually wear dentures?: No  CIWA:    COWS:     Musculoskeletal: Strength & Muscle Tone: within normal limits Gait & Station: shuffle Patient leans: N/A  Psychiatric Specialty Exam: Physical Exam  Nursing note and vitals reviewed. Constitutional: He appears well-developed and well-nourished.  HENT:  Head: Normocephalic and atraumatic.  Eyes: Pupils are equal, round, and reactive to light. Conjunctivae are normal.  Neck: Normal range of motion.  Cardiovascular: Regular rhythm and normal heart sounds.   Respiratory: Effort normal. No respiratory distress.  GI: Soft.  Musculoskeletal: Normal range of motion.  Neurological: He is alert.  Skin: Skin is warm and dry.  Psychiatric: Judgment normal. His affect is blunt. His speech is delayed. He is slowed and actively hallucinating. Cognition and memory are impaired. He expresses no homicidal and no suicidal ideation.    Review of Systems  Constitutional: Negative.   HENT: Negative.   Eyes: Negative.   Respiratory: Negative.   Cardiovascular: Negative.    Gastrointestinal: Negative.   Musculoskeletal: Negative.   Skin: Negative.   Neurological: Negative.   Psychiatric/Behavioral: Positive for depression and hallucinations. Negative for memory loss, substance abuse and suicidal ideas. The patient is not nervous/anxious and does not have insomnia.     Blood pressure (!) 143/82, pulse 62, temperature 97.7 F (36.5 C), temperature source Oral, resp. rate 18, height  (1.905 m), weight (!) 149.7 kg (330 lb), SpO2 96 %.Body mass index is 41.25 kg/m.  General Appearance: Casual  Eye Contact:  Good  Speech:  Slow  Volume:  Decreased  Mood:  Dysphoric  Affect:  Appropriate and Congruent  Thought Process:  Goal Directed  Orientation:  Full (Time, Place, and Person)  Thought Content:  Hallucinations: Auditory  Suicidal Thoughts:  No  Homicidal Thoughts:  No  Memory:  Immediate;   Good Recent;   Fair Remote;   Fair  Judgement:  Fair  Insight:  Fair  Psychomotor Activity:  Decreased  Concentration:  Concentration: Fair  Recall:  [employer] of Knowledge:  Fair  Language:  Fair  Akathisia:  No  Handed:  Right  AIMS (if indicated):     Assets:  Desire for Improvement Housing Resilience Social Support  ADL's:  Intact  Cognition:  Impaired,  Mild  Sleep:  Number of Hours: 7.75     Treatment Plan Summary: Daily contact with patient to assess and evaluate symptoms and progress in treatment, Medication management and Plan 36 year old man with a history of schizophrenia and alcohol abuse. Recent suicidal ideation. Patient suicidal ideation is improving although his mood still remains low and somewhat hopeless. Psychotic symptoms are persistent but he is working on tolerating them better. Patient's strength seems to be returning and he is ambulating without difficulty and not requiring oxygen. I suspect that previous oxygen use May of had a lot to do with when he was very anemic from bleeding. Patient is cooperative with treatment plan. I  discussed the use of clozapine with him and told him I would be planning to increase it by 25 mg per day as tolerated for the time being and he is agreeable. Follow-up tomorrow.  Mordecai Rasmussen, MD 06/10/2017, 5:03 PM

## 2017-06-10 NOTE — Progress Notes (Signed)
Patient ID: Andee Lineman, male   DOB: May 14, 1981, 36 y.o.   MRN: [id number] LCSW Group Therapy Note  06/10/2017 1:15pm  Type of Therapy/Topic:  Group Therapy:  Emotion Regulation  Participation Level:  Minimal   Description of Group:   The purpose of this group is to assist patients in learning to regulate negative emotions and experience positive emotions. Patients will be guided to discuss ways in which they have been vulnerable to their negative emotions. These vulnerabilities will be juxtaposed with experiences of positive emotions or situations, and patients will be challenged to use positive emotions to combat negative ones. Special emphasis will be placed on coping with negative emotions in conflict situations, and patients will process healthy conflict resolution skills.  Therapeutic Goals: 1. Patient will identify two positive emotions or experiences to reflect on in order to balance out negative emotions 2. Patient will label two or more emotions that they find the most difficult to experience 3. Patient will demonstrate positive conflict resolution skills through discussion and/or role plays  Summary of Patient Progress:  Pt able to meet therapeutic goals.  Shares about using imagery of the [place] battles[place] in [employer].  His thoughts of touring it make him feel calm and this helps when he is upset.    Therapeutic Modalities:   Cognitive Behavioral Therapy Feelings Identification Dialectical Behavioral Therapy   Glennon Mac, LCSW 06/10/2017 5:39 PM

## 2017-06-10 NOTE — [facility] Group Notes (Signed)
[facility] Group Notes:  (Nursing/MHT/Case Management/Adjunct)  Date:  06/10/2017  Time:  11:26 AM  Type of Therapy:  Psychoeducational Skills  Participation Level:  Active  Participation Quality:  Appropriate, Attentive and Sharing  Affect:  Appropriate  Cognitive:  Appropriate and Oriented  Insight:  Improving  Engagement in Group:  Engaged  Modes of Intervention:  Discussion, Exploration and Problem-solving  Summary of Progress/Problems:  Howard [name] 06/10/2017, 11:26 AM

## 2017-06-10 NOTE — Plan of Care (Signed)
Problem: Coping: Goal: Ability to verbalize feelings will improve Outcome: Progressing Verbalizes feelings without any difficulty.     
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
## 2017-06-10 NOTE — Progress Notes (Signed)
Endorses AH.  States that the voices are calling him names.  Refuses to verbalizes what the names are states that they are to bad.  Medication and group compliant.   Rates depression as a 1/10.  Affect constricted.  Support offered.  Safety rounds maintained.

## 2017-06-11 ENCOUNTER — Ambulatory Visit: Payer: Self-pay | Admitting: Nurse Practitioner

## 2017-06-11 MED ORDER — IBUPROFEN 600 MG PO TABS
600.0000 mg | ORAL_TABLET | Freq: Four times a day (QID) | ORAL | Status: DC | PRN
  Administered 2017-06-11 – 2017-06-16 (×6): 600 mg via ORAL
  Filled 2017-06-11 (×7): qty 1

## 2017-06-11 MED ORDER — CLOZAPINE 25 MG PO TABS: 75.0000 mg | ORAL_TABLET | Freq: Every day | ORAL | Status: DC

## 2017-06-11 NOTE — Plan of Care (Signed)
Problem: Coping: Goal: Ability to verbalize feelings will improve Outcome: Progressing Patient verbalized feelings are improving.

## 2017-06-11 NOTE — Progress Notes (Signed)
Denies SI/HI.  Denies having any depression.  Continues to endorse AH.  Verbalizes "[name] says that I will be better and able to leave in about 5 days.  I don't think that I will be well by then.  I like being here.  I like being around people and having someone to talk to"  Medication and group compliant.  Speech is tangential and soft.  Patient is childlike.  Support and encouragement offered.  Safety maintained.

## 2017-06-11 NOTE — Progress Notes (Signed)
[facility] MD Progress Note  06/11/2017 9:39 PM Howard [name]  MRN:  [id number] Subjective:  Follow-up Thursday the 20th. Patient seen. He met with treatment team as well. Patient is still having auditory hallucinations regularly but says his mood is slightly better. Not having as many intrusive suicidal thoughts. He has been going to groups and interacting with peers quite appropriately. Seems to be tolerating medicine without much difficulty. Principal Problem: Schizophrenia, paranoid ([place]) Diagnosis:   Patient Active Problem List   Diagnosis Date Noted  . Schizophrenia, paranoid ([place]) [F20.0] 06/09/2017  . PTSD (post-traumatic stress disorder) [F43.10] 06/08/2017  . Inflamed external hemorrhoid [K64.4] 05/13/2017  . End stage liver disease ([place]) [K72.90] 04/24/2017  . Esophageal varices without bleeding ([place]) [I85.00]   . Hematemesis [K92.0] 04/05/2017  . Upper GI bleed [K92.2]   . Iron deficiency anemia due to chronic blood loss [D50.0] 02/16/2017  . Nausea [R11.0] 01/18/2017  . Respiratory failure with hypoxia ([place]) [J96.91] 12/14/2016  . Seizures ([place]) [R56.9] 12/12/2016  . DNR (do not resuscitate) discussion [Z71.89] 08/11/2016  . Muscle weakness (generalized) [M62.81]   . GI bleed [K92.2] 08/07/2016  . OSA (obstructive sleep apnea) [G47.33] 04/29/2016  . Anemia [D64.9] 04/29/2016  . Thrombocytopenia ([place]) [D69.6] 04/29/2016  . Coagulopathy ([place]) [D68.9] 04/29/2016  . Obesity [E66.9] 04/29/2016  . Controlled type 2 diabetes mellitus without complication ([place]) [X44.8] 01/15/2016  . Essential (primary) hypertension [I10] 12/11/2015  . Pure hypercholesterolemia [E78.00] 12/11/2015  . Hepatic encephalopathy ([place]) [K72.90] 10/16/2015  . 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 Elevated transaminase level [R74.0] 07/04/2015  . Alcohol abuse [F10.10] 05/31/2015  . Paranoid schizophrenia ([place]) [F20.0] 03/20/2015   Total Time spent with patient: 30  minutes  Past Psychiatric History: Patient has a long history of schizophrenia and a more recent history of alcohol abuse which rapidly progressed to the point of cirrhosis. Past history of suicidality but has been quite a while since he was in the hospital. Has failed several antipsychotic medicines.  Past Medical History:  Past Medical History:  Diagnosis Date  . Alcoholic cirrhosis of liver with ascites ([place])   . Alcoholism ([place])   . Anemia   . Atrial fibrillation ([place])   . COPD (chronic obstructive pulmonary disease) ([place])   . Depression   . Diabetes mellitus, type II ([place])   . Esophageal varices ([place])   . Heart disease    irregular heart beat (palpitations) and heart murmur  . Hyperlipemia   . Hypertension   . Liver disease   . Multiple thyroid nodules   . Portal hypertensive gastropathy ([place])   . Schizophrenia ([place])   . Seizures ([place])     Past Surgical History:  Procedure Laterality Date  . ESOPHAGOGASTRODUODENOSCOPY N/A 10/05/2015   Procedure: ESOPHAGOGASTRODUODENOSCOPY (EGD);  Surgeon: Josefine Class, MD;  Location[facility] ENDOSCOPY;  Service: Endoscopy;  Laterality: N/A;  . ESOPHAGOGASTRODUODENOSCOPY (EGD) WITH PROPOFOL N/A 08/03/2015   Procedure: ESOPHAGOGASTRODUODENOSCOPY (EGD) WITH PROPOFOL;  Surgeon: Josefine Class, MD;  Location: [facility] ENDOSCOPY;  Service: Endoscopy;  Laterality: N/A;  . ESOPHAGOGASTRODUODENOSCOPY (EGD) WITH PROPOFOL N/A 08/31/2015   Procedure: ESOPHAGOGASTRODUODENOSCOPY (EGD) WITH PROPOFOL;  Surgeon: Josefine Class, MD;  Location: [facility] ENDOSCOPY;  Service: Endoscopy;  Laterality: N/A;  . ESOPHAGOGASTRODUODENOSCOPY (EGD) WITH PROPOFOL N/A 04/04/2016   Procedure: ESOPHAGOGASTRODUODENOSCOPY (EGD) WITH PROPOFOL;  Surgeon: Manya Silvas, MD;  Location: [facility] ENDOSCOPY;  Service: Endoscopy;  Laterality: N/A;  . ESOPHAGOGASTRODUODENOSCOPY (EGD) WITH PROPOFOL N/A 11/13/2016   Procedure: ESOPHAGOGASTRODUODENOSCOPY (EGD)  WITH PROPOFOL;  Surgeon:  Jonathon Bellows, MD;  Location: [facility] ENDOSCOPY;  Service: Endoscopy;  Laterality: N/A;  . ESOPHAGOGASTRODUODENOSCOPY (EGD) WITH PROPOFOL N/A 04/07/2017   Procedure: ESOPHAGOGASTRODUODENOSCOPY (EGD) WITH PROPOFOL;  Surgeon: Lucilla Lame, MD;  Location: [facility] ENDOSCOPY;  Service: Endoscopy;  Laterality: N/A;  . NO PAST SURGERIES     Family History:  Family History  Problem Relation Age of Onset  . Heart disease Mother   . Hypertension Mother   . Hyperlipidemia Mother   . Stroke Father   . Heart attack Father   . Hypertension Father   . Heart disease Father   . Alcohol abuse Father   . Heart disease Brother    Family Psychiatric  History: I believe he does have a relative who also has a major mental health problem and his brother seems to have some disability Social History:  History  Alcohol Use  . 0.0 oz/week    Comment: last drink 1 days ago (noted: 05/30/2017)     History  Drug Use No    Social History   Social History  . Marital status: Single    Spouse name: N/A  . Number of children: N/A  . Years of education: N/A   Occupational History  . disabled    Social History Main Topics  . Smoking status: Never Smoker  . Smokeless tobacco: Never Used  . Alcohol use 0.0 oz/week     Comment: last drink 1 days ago (noted: 05/30/2017)  . Drug use: No  . Sexual activity: No   Other Topics Concern  . None   Social History Narrative   ** Merged History Encounter **       Additional Social History:                         Sleep: Fair  Appetite:  Fair  Current Medications: Current Facility-Administered Medications  Medication Dose Route Frequency Provider Last Rate Last Dose  . acamprosate (CAMPRAL) tablet 666 mg  666 mg Oral TID WC [name], Madie Reno, MD   666 mg at 06/11/17 1648  . albuterol (PROVENTIL HFA;VENTOLIN HFA) 108 (90 Base) MCG/ACT inhaler 2 puff  2 puff Inhalation Q4H PRN [name], [name], MD      . alum & mag hydroxide-simeth (MAALOX/MYLANTA) 200-200-20  MG/5ML suspension 30 mL  30 mL Oral Q4H PRN [name], Madie Reno, MD      . Derrill Memo ON 06/12/2017] cloZAPine (CLOZARIL) tablet 75 mg  75 mg Oral QHS [name], [name], MD      . ferrous sulfate tablet 325 mg  325 mg Oral Q breakfast [name], Madie Reno, MD   325 mg at 06/11/17 0843  . folic acid (FOLVITE) tablet 1 mg  1 mg Oral Daily [name], [name], MD   1 mg at 06/11/17 0843  . hydrOXYzine (ATARAX/VISTARIL) tablet 25 mg  25 mg Oral TID PRN [name], [name], MD   25 mg at 06/11/17 2111  . ibuprofen (ADVIL,MOTRIN) tablet 600 mg  600 mg Oral Q6H PRN Pucilowska, Jolanta B, MD   600 mg at 06/11/17 1517  . lactulose (CHRONULAC) 10 GM/15ML solution 45 g  45 g Oral TID [name], Madie Reno, MD   45 g at 06/11/17 1249  . levETIRAcetam (KEPPRA) tablet 1,500 mg  1,500 mg Oral BID [name], Madie Reno, MD   1,500 mg at 06/11/17 1648  . losartan (COZAAR) tablet 25 mg  25 mg Oral Daily [name], Madie Reno, MD  25 mg at 06/11/17 0843  . magnesium hydroxide (MILK OF MAGNESIA) suspension 30 mL  30 mL Oral Daily PRN [name], [name], MD      . metFORMIN (GLUCOPHAGE) tablet 500 mg  500 mg Oral BID WC [name], Madie Reno, MD   500 mg at 06/11/17 1648  . multivitamin with minerals tablet 1 tablet  1 tablet Oral Daily [name], Madie Reno, MD   1 tablet at 06/11/17 [phone]  . nadolol (CORGARD) tablet 20 mg  20 mg Oral Daily [name], Madie Reno, MD   20 mg at 06/11/17 0850  . pantoprazole (PROTONIX) EC tablet 40 mg  40 mg Oral Daily [name], Madie Reno, MD   40 mg at 06/11/17 0843  . protein supplement (PREMIER PROTEIN) liquid  11 oz Oral BID BM [name], [name], MD   11 oz at 06/11/17 1000  . rifaximin (XIFAXAN) tablet 550 mg  550 mg Oral BID [name], Madie Reno, MD   550 mg at 06/11/17 1648  . sucralfate (CARAFATE) tablet 1 g  1 g Oral TID WC & HS [name], Madie Reno, MD   1 g at 06/11/17 1648  . thiamine (VITAMIN B-1) tablet 100 mg  100 mg Oral Daily [name], Madie Reno, MD   100 mg at 06/11/17 5956    Lab Results:  Results for orders placed or performed during the  hospital encounter of 06/09/17 (from the past 48 hour(s))  Hemoglobin A1c     Status: Abnormal   Collection Time: 06/10/17  7:00 AM  Result Value Ref Range   Hgb A1c MFr Bld 5.7 (H) 4.8 - 5.6 %    Comment: (NOTE) Pre diabetes:          5.7%-6.4% Diabetes:              >6.4% Glycemic control for   <7.0% adults with diabetes    Mean Plasma Glucose 116.89 mg/dL    Comment: Performed at [facility], [place] [address] [place], [place] [address]  Lipid panel     Status: Abnormal   Collection Time: 06/10/17  7:00 AM  Result Value Ref Range   Cholesterol 199 0 - 200 mg/dL   Triglycerides 97 <150 mg/dL   HDL 37 (L) >40 mg/dL   Total CHOL/HDL Ratio 5.4 RATIO   VLDL 19 0 - 40 mg/dL   LDL Cholesterol 143 (H) 0 - 99 mg/dL    Comment:        Total Cholesterol/HDL:CHD Risk Coronary Heart Disease Risk Table                     Men   Women  1/2 Average Risk   3.4   3.3  Average Risk       5.0   4.4  2 X Average Risk   9.6   7.1  3 X Average Risk  23.4   11.0        Use the calculated Patient Ratio above and the CHD Risk Table to determine the patient's CHD Risk.        ATP III CLASSIFICATION (LDL):  <100     mg/dL   Optimal  100-129  mg/dL   Near or Above                    Optimal  130-159  mg/dL   Borderline  160-189  mg/dL   High  >190     mg/dL   Very High  TSH     Status: None   Collection Time: 06/10/17  7:00 AM  Result Value Ref Range   TSH 1.391 0.350 - 4.500 uIU/mL    Comment: Performed by a 3rd Generation assay with a functional sensitivity of <=0.01 uIU/mL.    Blood Alcohol level:  Lab Results  Component Value Date   ETH <5 06/08/2017   ETH <5 08/67/6195    Metabolic Disorder Labs: Lab Results  Component Value Date   HGBA1C 5.7 (H) 06/10/2017   MPG 116.89 06/10/2017   MPG 151 12/12/2016   No results found for: PROLACTIN Lab Results  Component Value Date   CHOL 199 06/10/2017   TRIG 97 06/10/2017   HDL 37 (L) 06/10/2017   CHOLHDL 5.4 06/10/2017    VLDL 19 06/10/2017   LDLCALC 143 (H) 06/10/2017   LDLCALC 121 (H) 01/19/2017    Physical Findings: AIMS: Facial and Oral Movements Muscles of Facial Expression: None, normal Lips and Perioral Area: Mild Jaw: None, normal Tongue: None, normal,Extremity Movements Upper (arms, wrists, hands, fingers): Moderate Lower (legs, knees, ankles, toes): None, normal, Trunk Movements Neck, shoulders, hips: None, normal, Overall Severity Severity of abnormal movements (highest score from questions above): Severe Incapacitation due to abnormal movements: None, normal Patient's awareness of abnormal movements (rate only patient's report): Aware, no distress, Dental Status Current problems with teeth and/or dentures?: Yes Does patient usually wear dentures?: No  CIWA:    COWS:     Musculoskeletal: Strength & Muscle Tone: within normal limits Gait & Station: shuffle Patient leans: N/A  Psychiatric Specialty Exam: Physical Exam  Nursing note and vitals reviewed. Constitutional: He appears well-developed and well-nourished.  HENT:  Head: Normocephalic and atraumatic.  Eyes: Pupils are equal, round, and reactive to light. Conjunctivae are normal.  Neck: Normal range of motion.  Cardiovascular: Regular rhythm and normal heart sounds.   Respiratory: Effort normal. No respiratory distress.  GI: Soft.  Musculoskeletal: Normal range of motion.  Neurological: He is alert.  Skin: Skin is warm and dry.  Psychiatric: Judgment normal. His affect is blunt. His speech is delayed. He is slowed and actively hallucinating. Cognition and memory are impaired. He expresses no homicidal and no suicidal ideation.    Review of Systems  Constitutional: Negative.   HENT: Negative.   Eyes: Negative.   Respiratory: Negative.   Cardiovascular: Negative.   Gastrointestinal: Negative.   Musculoskeletal: Negative.   Skin: Negative.   Neurological: Negative.   Psychiatric/Behavioral: Positive for depression and  hallucinations. Negative for memory loss, substance abuse and suicidal ideas. The patient is not nervous/anxious and does not have insomnia.     Blood pressure (!) 147/87, pulse 73, temperature 98.1 F (36.7 C), temperature source Oral, resp. rate 18, height 6' 3"  (1.905 m), weight (!) 149.7 kg (330 lb), SpO2 97 %.Body mass index is 41.25 kg/m.  General Appearance: Casual  Eye Contact:  Good  Speech:  Slow  Volume:  Decreased  Mood:  Dysphoric  Affect:  Appropriate and Congruent  Thought Process:  Goal Directed  Orientation:  Full (Time, Place, and Person)  Thought Content:  Hallucinations: Auditory  Suicidal Thoughts:  No  Homicidal Thoughts:  No  Memory:  Immediate;   Good Recent;   Fair Remote;   Fair  Judgement:  Fair  Insight:  Fair  Psychomotor Activity:  Decreased  Concentration:  Concentration: Fair  Recall:  [employer] of Knowledge:  Fair  Language:  Fair  Akathisia:  No  Handed:  Right  AIMS (if indicated):     Assets:  Desire for Improvement Housing Resilience Social Support  ADL's:  Intact  Cognition:  Impaired,  Mild  Sleep:  Number of Hours: 7.15     Treatment Plan Summary: Daily contact with patient to assess and evaluate symptoms and progress in treatment, Medication management and Plan We reviewed goals with the patient sent treatment plan. Increase clozapine today to 75 mg at night. Encourage his continued participation on the unit. Encourage attendance at substance abuse groups.  Alethia Berthold, MD 06/11/2017, 9:39 PM

## 2017-06-11 NOTE — Progress Notes (Signed)
D: Pt denies SI/HI/AVH, affect is bright on approach, he is pleasant and cooperative with treatment plan. Pt appears less anxious and he is interacting with peers and staff appropriately.  A: Pt was offered support and encouragement. Pt was given scheduled medications. Pt was encouraged to attend groups. Q 15 minute checks were done for safety.  R:Pt attends groups and interacts well with peers and staff. Pt is taking medication. Pt has no complaints.Pt receptive to treatment and safety maintained on unit.

## 2017-06-11 NOTE — Tx Team (Signed)
Interdisciplinary Treatment and Diagnostic Plan Update  06/11/2017 Time of Session: 1505 [name] MRN: [id number]  Principal Diagnosis: Schizophrenia, paranoid (HCC)  Secondary Diagnoses: Principal Problem:   Schizophrenia, paranoid (HCC) Active Problems:   Alcohol abuse   Alcoholic cirrhosis of liver with ascites (HCC)   Controlled type 2 diabetes mellitus without complication (HCC)   Pure hypercholesterolemia   Current Medications:  Current Facility-Administered Medications  Medication Dose Route Frequency Provider Last Rate Last Dose  . acamprosate (CAMPRAL) tablet 666 mg  666 mg Oral TID WC [name], [name], MD   666 mg at 06/11/17 1249  . albuterol (PROVENTIL HFA;VENTOLIN HFA) 108 (90 Base) MCG/ACT inhaler 2 puff  2 puff Inhalation Q4H PRN [name], [name], MD      . alum & mag hydroxide-simeth (MAALOX/MYLANTA) 200-200-20 MG/5ML suspension 30 mL  30 mL Oral Q4H PRN [name], [name], MD      . cloZAPine (CLOZARIL) tablet 50 mg  50 mg Oral QHS [name], [name], MD   50 mg at 06/10/17 2106  . ferrous sulfate tablet 325 mg  325 mg Oral Q breakfast [name], [name], MD   325 mg at 06/11/17 0843  . folic acid (FOLVITE) tablet 1 mg  1 mg Oral Daily [name], [name], MD   1 mg at 06/11/17 0843  . hydrOXYzine (ATARAX/VISTARIL) tablet 25 mg  25 mg Oral TID PRN [name], [name], MD   25 mg at 06/10/17 2108  . ibuprofen (ADVIL,MOTRIN) tablet 600 mg  600 mg Oral Q6H PRN Pucilowska, Jolanta B, MD   600 mg at 06/11/17 1517  . lactulose (CHRONULAC) 10 GM/15ML solution 45 g  45 g Oral TID [name], [name], MD   45 g at 06/11/17 1249  . levETIRAcetam (KEPPRA) tablet 1,500 mg  1,500 mg Oral BID [name], [name], MD   1,500 mg at 06/11/17 0843  . losartan (COZAAR) tablet 25 mg  25 mg Oral Daily [name], [name], MD   25 mg at 06/11/17 0843  . magnesium hydroxide (MILK OF MAGNESIA) suspension 30 mL  30 mL Oral Daily PRN [name], [name], MD      . metFORMIN (GLUCOPHAGE) tablet 500 mg  500 mg Oral BID WC  [name], [name], MD   500 mg at 06/11/17 0843  . multivitamin with minerals tablet 1 tablet  1 tablet Oral Daily [name], [name], MD   1 tablet at 06/11/17 [phone]  . nadolol (CORGARD) tablet 20 mg  20 mg Oral Daily [name], [name], MD   20 mg at 06/11/17 0850  . pantoprazole (PROTONIX) EC tablet 40 mg  40 mg Oral Daily [name], [name], MD   40 mg at 06/11/17 0843  . protein supplement (PREMIER PROTEIN) liquid  11 oz Oral BID BM [name], [name], MD   11 oz at 06/11/17 1000  . rifaximin (XIFAXAN) tablet 550 mg  550 mg Oral BID [name], [name], MD   550 mg at 06/11/17 0842  . sucralfate (CARAFATE) tablet 1 g  1 g Oral TID WC & HS [name], [name], MD   1 g at 06/11/17 1249  . thiamine (VITAMIN B-1) tablet 100 mg  100 mg Oral Daily [name], [name], MD   100 mg at 06/11/17 1191   PTA Medications: Prescriptions Prior to Admission  Medication Sig Dispense Refill Last Dose  . acamprosate (CAMPRAL) 333 MG tablet Take 2 tablets (666 mg total) by mouth 3 (three) times daily with meals.  180 tablet 5 Taking  . albuterol (PROVENTIL HFA;VENTOLIN HFA) 108 (90 Base) MCG/ACT inhaler Inhale 2 puffs into the lungs every 6 (six) hours as needed for wheezing or shortness of breath. 1 Inhaler 2 Taking  . citalopram (CELEXA) 20 MG tablet Take 2 tablets (40 mg total) by mouth daily. 60 tablet 1   . feeding supplement, ENSURE ENLIVE, (ENSURE ENLIVE) LIQD Take 237 mLs by mouth 2 (two) times daily between meals. 60 Bottle 0 Taking  . ferrous sulfate 325 (65 FE) MG tablet Take 3 tablets (975 mg total) by mouth every Monday, Wednesday, and Friday. With breakfast 36 tablet 5 Taking  . folic acid (FOLVITE) 1 MG tablet TAKE 1 TABLET BY MOUTH DAILY 30 tablet 11 Taking  . furosemide (LASIX) 20 MG tablet Take 20 mg by mouth daily.   Taking  . hydrocortisone (ANUSOL-HC) 2.5 % rectal cream Place 1 application rectally 2 (two) times daily. 30 g 0 Taking  . lactulose (CHRONULAC) 10 GM/15ML solution Take 45 mLs (30 g total) by mouth 4  (four) times daily. 5676 mL 0 Taking  . levETIRAcetam (KEPPRA) 750 MG tablet Take 2 tablets (1,500 mg total) by mouth 2 (two) times daily. 120 tablet 0 Taking  . losartan (COZAAR) 25 MG tablet TAKE ONE TABLET BY MOUTH DAILY 30 tablet 5 Taking  . lurasidone (LATUDA) 40 MG TABS tablet Take 3 tablets (120 mg total) by mouth daily with supper. 90 tablet 3 Taking  . metFORMIN (GLUCOPHAGE) 500 MG tablet TAKE 1 TABLET BY MOUTH 2 TIMES DAILY WITH MEALS 60 tablet 3 Taking  . nadolol (CORGARD) 20 MG tablet TAKE ONE TABLET BY MOUTH DAILY 30 tablet 5 Taking  . NON FORMULARY    Taking  . omeprazole (PRILOSEC) 40 MG capsule Take 1 capsule (40 mg total) by mouth 2 (two) times daily. 60 capsule 0 Taking  . perphenazine (TRILAFON) 8 MG tablet Take 1 tablet (8 mg total) by mouth at bedtime. 30 tablet 1   . spironolactone (ALDACTONE) 50 MG tablet Take 1 tablet (50 mg total) by mouth 2 (two) times daily. 60 tablet 5 Taking  . sucralfate (CARAFATE) 1 g tablet Take 1 tablet by mouth 4 (four) times daily.   Taking  . temazepam (RESTORIL) 30 MG capsule Take 1 capsule (30 mg total) by mouth at bedtime as needed for sleep. 7 capsule 0   . thiamine (VITAMIN B-1) 100 MG tablet Take 100 mg by mouth daily.   Taking  . traZODone (DESYREL) 100 MG tablet Take 1 tablet (100 mg total) by mouth at bedtime as needed for sleep. 30 tablet 5 Taking  . XIFAXAN 550 MG TABS tablet TAKE 1 TABLET (550 MG TOTAL) BY MOUTH TWO TIMES DAILY. 60 tablet 5 Taking    Patient Stressors: Substance abuse  Patient Strengths: Active sense of humor Supportive family/friends  Treatment Modalities: Medication Management, Group therapy, Case management,  1 to 1 session with clinician, Psychoeducation, Recreational therapy.   Physician Treatment Plan for Primary Diagnosis: Schizophrenia, paranoid (HCC) Long Term Goal(s): Improvement in symptoms so as ready for discharge Improvement in symptoms so as ready for discharge   Short Term Goals: Ability to  verbalize feelings will improve Ability to disclose and discuss suicidal ideas Ability to maintain clinical measurements within normal limits will improve Compliance with prescribed medications will improve  Medication Management: Evaluate patient's response, side effects, and tolerance of medication regimen.  Therapeutic Interventions: 1 to 1 sessions, Unit Group sessions and Medication administration.  Evaluation of  Outcomes: Progressing  Physician Treatment Plan for Secondary Diagnosis: Principal Problem:   Schizophrenia, paranoid (HCC) Active Problems:   Alcohol abuse   Alcoholic cirrhosis of liver with ascites (HCC)   Controlled type 2 diabetes mellitus without complication (HCC)   Pure hypercholesterolemia  Long Term Goal(s): Improvement in symptoms so as ready for discharge Improvement in symptoms so as ready for discharge   Short Term Goals: Ability to verbalize feelings will improve Ability to disclose and discuss suicidal ideas Ability to maintain clinical measurements within normal limits will improve Compliance with prescribed medications will improve     Medication Management: Evaluate patient's response, side effects, and tolerance of medication regimen.  Therapeutic Interventions: 1 to 1 sessions, Unit Group sessions and Medication administration.  Evaluation of Outcomes: Progressing   RN Treatment Plan for Primary Diagnosis: Schizophrenia, paranoid (HCC) Long Term Goal(s): Knowledge of disease and therapeutic regimen to maintain health will improve  Short Term Goals: Ability to identify and develop effective coping behaviors will improve and Compliance with prescribed medications will improve  Medication Management: RN will administer medications as ordered by provider, will assess and evaluate patient's response and provide education to patient for prescribed medication. RN will report any adverse and/or side effects to prescribing provider.  Therapeutic  Interventions: 1 on 1 counseling sessions, Psychoeducation, Medication administration, Evaluate responses to treatment, Monitor vital signs and CBGs as ordered, Perform/monitor CIWA, COWS, AIMS and Fall Risk screenings as ordered, Perform wound care treatments as ordered.  Evaluation of Outcomes: Progressing   LCSW Treatment Plan for Primary Diagnosis: Schizophrenia, paranoid (HCC) Long Term Goal(s): Safe transition to appropriate next level of care at discharge, Engage patient in therapeutic group addressing interpersonal concerns.  Short Term Goals: Engage patient in aftercare planning with referrals and resources, Facilitate patient progression through stages of change regarding substance use diagnoses and concerns and Increase skills for wellness and recovery  Therapeutic Interventions: Assess for all discharge needs, 1 to 1 time with Social worker, Explore available resources and support systems, Assess for adequacy in community support network, Educate family and significant other(s) on suicide prevention, Complete Psychosocial Assessment, Interpersonal group therapy.  Evaluation of Outcomes: Progressing   Progress in Treatment: Attending groups: Yes. Participating in groups: Yes. Taking medication as prescribed: Yes. Toleration medication: Yes. Family/Significant other contact made: No, will contact:  mother Patient understands diagnosis: Yes. Discussing patient identified problems/goals with staff: Yes. Medical problems stabilized or resolved: Yes. Denies suicidal/homicidal ideation: Yes. Issues/concerns per patient self-inventory: No. Other: none  New problem(s) identified: No, Describe:  none  New Short Term/Long Term Goal(s): Pt goal: "stop hearing voices or at least have them slow down."  Discharge Plan or Barriers: PT will return to outpt services at [facility] with Dr Toni Amend and Nolon Rod.  Reason for Continuation of Hospitalization: Hallucinations Medication  stabilization  Estimated Length of Stay:5-7 days.  Attendees: Patient:Howard [name] 06/11/2017 3:28 PM  Physician: Dr Toni Amend 06/11/2017 3:28 PM  Nursing: Leonia Reader, RN 06/11/2017 3:28 PM  RN Care Manager: 06/11/2017 3:28 PM  Social Worker: Daleen Squibb, LCSW 06/11/2017 3:28 PM  Recreational Therapist:  06/11/2017 3:28 PM  Other:  06/11/2017 3:28 PM  Other:  06/11/2017 3:28 PM  Other: 06/11/2017 3:28 PM    Scribe for Treatment Team: Lorri Frederick, LCSW 06/11/2017 3:28 PM

## 2017-06-11 NOTE — Plan of Care (Signed)
Problem: Coping: Goal: Ability to verbalize feelings will improve Outcome: Progressing Verbalizes feelings without any difficulty.     

## 2017-06-11 NOTE — [facility] Group Notes (Signed)
LCSW Group Therapy Note  06/11/2017 1:15pm  Type of Therapy/Topic:  Group Therapy:  Balance in Life  Participation Level:  Active  Description of Group:    This group will address the concept of balance and how it feels and looks when one is unbalanced. Patients will be encouraged to process areas in their lives that are out of balance and identify reasons for remaining unbalanced. Facilitators will guide patients in utilizing problem-solving interventions to address and correct the stressor making their life unbalanced. Understanding and applying boundaries will be explored and addressed for obtaining and maintaining a balanced life. Patients will be encouraged to explore ways to assertively make their unbalanced needs known to significant others in their lives, using other group members and facilitator for support and feedback.  Therapeutic Goals: 1. Patient will identify two or more emotions or situations they have that consume much of in their lives. 2. Patient will identify signs/triggers that life has become out of balance:  3. Patient will identify two ways to set boundaries in order to achieve balance in their lives:  4. Patient will demonstrate ability to communicate their needs through discussion and/or role plays  Summary of Patient Progress: Quiet but attentive throughout group, participated when prompted.  Identified "trying to keep from falling" as example of balance - walks w walker.  However, also stated he needed to "keep my mind from falling also"; identified "mean people" as triggers for anxiety - states this knocks him off balance and has led to self harm in past.  Appreciated "more ideas for coping skills" generated by group.       Therapeutic Modalities:   Cognitive Behavioral Therapy Solution-Focused Therapy Assertiveness Training  Sallee Lange, [place] 06/11/2017 1:07 PM

## 2017-06-12 MED ORDER — CLOZAPINE 100 MG PO TABS
100.0000 mg | ORAL_TABLET | Freq: Every day | ORAL | Status: DC
  Administered 2017-06-12 – 2017-06-13 (×2): 100 mg via ORAL
  Filled 2017-06-12 (×2): qty 1

## 2017-06-12 NOTE — Plan of Care (Signed)
Problem: Coping: Goal: Ability to verbalize feelings will improve Outcome: Progressing Patient verbalized feelings to staff.    

## 2017-06-12 NOTE — [facility] Group Notes (Signed)
[facility] Group Notes:  (Nursing/MHT/Case Management/Adjunct)  Date:  06/12/2017  Time:  12:48 AM  Type of Therapy:  Evening Wrap-up Group  Participation Level:  Active  Participation Quality:  Appropriate and Attentive  Affect:  Appropriate  Cognitive:  Alert and Appropriate  Insight:  Appropriate and Good  Engagement in Group:  Developing/Improving and Engaged  Modes of Intervention:  Discussion  Summary of Progress/Problems:  Howard [name] 06/12/2017, 12:48 AM

## 2017-06-12 NOTE — [facility] Group Notes (Signed)
LCSW Group Therapy Note  06/12/2017 1:15pm  Type of Therapy and Topic:  Group Therapy:  Feelings around Relapse and Recovery  Participation Level:  Active   Description of Group:    Patients in this group will discuss emotions they experience before and after a relapse. They will process how experiencing these feelings, or avoidance of experiencing them, relates to having a relapse. Facilitator will guide patients to explore emotions they have related to recovery. Patients will be encouraged to process which emotions are more powerful. They will be guided to discuss the emotional reaction significant others in their lives may have to their relapse or recovery. Patients will be assisted in exploring ways to respond to the emotions of others without this contributing to a relapse.  Therapeutic Goals: 1. Patient will identify two or more emotions that lead to a relapse for them 2. Patient will identify two emotions that result when they relapse 3. Patient will identify two emotions related to recovery 4. Patient will demonstrate ability to communicate their needs through discussion and/or role plays   Summary of Patient Progress: Despite anxiety, patient was more verbal in group today.  Described significant anxiety which leads to patient becoming increasingly isolated and withdrawn.  Was able to connect w others who encouraged him to talk w caregivers when anxious.  Patient related that Dr Toni Amend is his main support but was also able to indicate that he would reach out to family when he recognized increasing anxiety levels.  Expressed feeling more hopeful after recent medication changes.     Therapeutic Modalities:   Cognitive Behavioral Therapy Solution-Focused Therapy Assertiveness Training Relapse Prevention Therapy   Sallee Lange, LCSW 06/12/2017 3:00 PM

## 2017-06-12 NOTE — Plan of Care (Signed)
Problem: Education: Goal: Will be free of psychotic symptoms Outcome: Progressing Patient states medications have helped and he is improving

## 2017-06-12 NOTE — Progress Notes (Signed)
D: Pt denies SI/HI/AVH, affect is bright on approach, he is pleasant and cooperative with treatment plan. Patient 's interaction with staff is childlike and needy, she is  interacting with peers and staff appropriately. Patient's thoughts are organized,no bizarre behavior noted.  A: Pt was offered support and encouragement. Pt was given scheduled medications. Pt was encouraged to attend groups. Q 15 minute checks were done for safety.  R:Pt attends groups and interacts well with peers and staff. Pt is complaint with medication. Patient receptive to treatment and safety maintained on unit.

## 2017-06-12 NOTE — [facility] Group Notes (Signed)
[facility] Group Notes:  (Nursing/MHT/Case Management/Adjunct)  Date:  06/12/2017  Time:  10:00 AM  Type of Therapy:  Psychoeducational Skills  Participation Level:  Minimal  Participation Quality:  Attentive  Affect:  Appropriate  Cognitive:  Oriented  Insight:  Improving  Engagement in Group:  Engaged  Modes of Intervention:  Discussion and Education  Summary of Progress/Problems:  Howard [name] 06/12/2017, 10:00 AM

## 2017-06-12 NOTE — Progress Notes (Signed)
Patient is on the unit and social with select peers. He denies si, hi, avh. No pain. He does go out on the grounds for outside time. He walks around the unit. He is child like. He is med compliant. He does use a walker to get around with. No behavior issues today noted.

## 2017-06-12 NOTE — Progress Notes (Signed)
BDaily contact with patient to assess and evaluate symptoms and progress in treatment, Medication management and Plan  [facility] MD Progress Note  06/12/2017 7:39 PM Howard [name]  MRN:  [id number] Subjective:  Follow-up for Friday the 21st. Patient was seen and chart reviewed. Patient says he is feeling significantly better today. He is no longer feeling sad. He still has auditory hallucinations but says they are less frequent. His affect is brighter. He is attending groups and interacting with others. He looks like he is taking care of himself fairly well. I'm glad that he is tolerating the clozapine and says he has very few to no side effects Principal Problem: Schizophrenia, paranoid (HCC) Diagnosis:   Patient Active Problem List   Diagnosis Date Noted  . Schizophrenia, paranoid (HCC) [F20.0] 06/09/2017  . PTSD (post-traumatic stress disorder) [F43.10] 06/08/2017  . Inflamed external hemorrhoid [K64.4] 05/13/2017  . End stage liver disease (HCC) [K72.90] 04/24/2017  . Esophageal varices without bleeding (HCC) [I85.00]   . Hematemesis [K92.0] 04/05/2017  . Upper GI bleed [K92.2]   . Iron deficiency anemia due to chronic blood loss [D50.0] 02/16/2017  . Nausea [R11.0] 01/18/2017  . Respiratory failure with hypoxia (HCC) [J96.91] 12/14/2016  . Seizures (HCC) [R56.9] 12/12/2016  . DNR (do not resuscitate) discussion [Z71.89] 08/11/2016  . Muscle weakness (generalized) [M62.81]   . GI bleed [K92.2] 08/07/2016  . OSA (obstructive sleep apnea) [G47.33] 04/29/2016  . Anemia [D64.9] 04/29/2016  . Thrombocytopenia (HCC) [D69.6] 04/29/2016  . Coagulopathy (HCC) [D68.9] 04/29/2016  . Obesity [E66.9] 04/29/2016  . Controlled type 2 diabetes mellitus without complication (HCC) [E11.9] 01/15/2016  . Essential (primary) hypertension [I10] 12/11/2015  . Pure hypercholesterolemia [E78.00] 12/11/2015  . Hepatic encephalopathy (HCC) [K72.90] 10/16/2015  . 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 Elevated transaminase level [R74.0] 07/04/2015  . Alcohol abuse [F10.10] 05/31/2015  . Paranoid schizophrenia (HCC) [F20.0] 03/20/2015   Total Time spent with patient: 20 minutes  Past Psychiatric History: Patient has a long history of schizophrenia and a more recent history of alcohol abuse which rapidly progressed to the point of cirrhosis. Past history of suicidality but has been quite a while since he was in the hospital. Has failed several antipsychotic medicines.  Past Medical History:  Past Medical History:  Diagnosis Date  . Alcoholic cirrhosis of liver with ascites (HCC)   . Alcoholism (HCC)   . Anemia   . Atrial fibrillation (HCC)   . COPD (chronic obstructive pulmonary disease) (HCC)   . Depression   . Diabetes mellitus, type II (HCC)   . Esophageal varices (HCC)   . Heart disease    irregular heart beat (palpitations) and heart murmur  . Hyperlipemia   . Hypertension   . Liver disease   . Multiple thyroid nodules   . Portal hypertensive gastropathy (HCC)   . Schizophrenia (HCC)   . Seizures (HCC)     Past Surgical History:  Procedure Laterality Date  . ESOPHAGOGASTRODUODENOSCOPY N/A 10/05/2015   Procedure: ESOPHAGOGASTRODUODENOSCOPY (EGD);  Surgeon: Elnita Maxwell, MD;  Location: [facility] ENDOSCOPY;  Service: Endoscopy;  Laterality: N/A;  . ESOPHAGOGASTRODUODENOSCOPY (EGD) WITH PROPOFOL N/A 08/03/2015   Procedure: ESOPHAGOGASTRODUODENOSCOPY (EGD) WITH PROPOFOL;  Surgeon: Elnita Maxwell, MD;  Location: [facility] ENDOSCOPY;  Service: Endoscopy;  Laterality: N/A;  . ESOPHAGOGASTRODUODENOSCOPY (EGD) WITH PROPOFOL N/A 08/31/2015   Procedure: ESOPHAGOGASTRODUODENOSCOPY (EGD) WITH PROPOFOL;  Surgeon: Elnita Maxwell, MD;  Location: [facility] ENDOSCOPY;  Service: Endoscopy;  Laterality: N/A;  . ESOPHAGOGASTRODUODENOSCOPY (  EGD) WITH PROPOFOL N/A 04/04/2016   Procedure: ESOPHAGOGASTRODUODENOSCOPY (EGD) WITH PROPOFOL;  Surgeon: Scot Jun, MD;   Location: [facility] ENDOSCOPY;  Service: Endoscopy;  Laterality: N/A;  . ESOPHAGOGASTRODUODENOSCOPY (EGD) WITH PROPOFOL N/A 11/13/2016   Procedure: ESOPHAGOGASTRODUODENOSCOPY (EGD) WITH PROPOFOL;  Surgeon: Wyline Mood, MD;  Location: [facility] ENDOSCOPY;  Service: Endoscopy;  Laterality: N/A;  . ESOPHAGOGASTRODUODENOSCOPY (EGD) WITH PROPOFOL N/A 04/07/2017   Procedure: ESOPHAGOGASTRODUODENOSCOPY (EGD) WITH PROPOFOL;  Surgeon: Midge Minium, MD;  Location: [facility] ENDOSCOPY;  Service: Endoscopy;  Laterality: N/A;  . NO PAST SURGERIES     Family History:  Family History  Problem Relation Age of Onset  . Heart disease Mother   . Hypertension Mother   . Hyperlipidemia Mother   . Stroke Father   . Heart attack Father   . Hypertension Father   . Heart disease Father   . Alcohol abuse Father   . Heart disease Brother    Family Psychiatric  History: I believe he does have a relative who also has a major mental health problem and his brother seems to have some disability Social History:  History  Alcohol Use  . 0.0 oz/week    Comment: last drink 1 days ago (noted: 05/30/2017)     History  Drug Use No    Social History   Social History  . Marital status: Single    Spouse name: N/A  . Number of children: N/A  . Years of education: N/A   Occupational History  . disabled    Social History Main Topics  . Smoking status: Never Smoker  . Smokeless tobacco: Never Used  . Alcohol use 0.0 oz/week     Comment: last drink 1 days ago (noted: 05/30/2017)  . Drug use: No  . Sexual activity: No   Other Topics Concern  . None   Social History Narrative   ** Merged History Encounter **       Additional Social History:                         Sleep: Fair  Appetite:  Fair  Current Medications: Current Facility-Administered Medications  Medication Dose Route Frequency Provider Last Rate Last Dose  . acamprosate (CAMPRAL) tablet 666 mg  666 mg Oral TID WC Freddie Dymek, [name], MD   666 mg at  06/12/17 1825  . albuterol (PROVENTIL HFA;VENTOLIN HFA) 108 (90 Base) MCG/ACT inhaler 2 puff  2 puff Inhalation Q4H PRN Jaquilla Woodroof T, MD      . alum & mag hydroxide-simeth (MAALOX/MYLANTA) 200-200-20 MG/5ML suspension 30 mL  30 mL Oral Q4H PRN Dontrey Snellgrove T, MD      . cloZAPine (CLOZARIL) tablet 100 mg  100 mg Oral QHS Saiquan Hands T, MD      . ferrous sulfate tablet 325 mg  325 mg Oral Q breakfast Ermin Parisien, [name], MD   325 mg at 06/12/17 0831  . folic acid (FOLVITE) tablet 1 mg  1 mg Oral Daily Jeremaine Maraj T, MD   1 mg at 06/12/17 0831  . hydrOXYzine (ATARAX/VISTARIL) tablet 25 mg  25 mg Oral TID PRN Chiquetta Langner, [name], MD   25 mg at 06/12/17 1221  . ibuprofen (ADVIL,MOTRIN) tablet 600 mg  600 mg Oral Q6H PRN Pucilowska, Jolanta B, MD   600 mg at 06/12/17 1221  . lactulose (CHRONULAC) 10 GM/15ML solution 45 g  45 g Oral TID Paxten Appelt, [name], MD   45 g at 06/12/17 1826  .  levETIRAcetam (KEPPRA) tablet 1,500 mg  1,500 mg Oral BID Ishaq Maffei, [name], MD   1,500 mg at 06/12/17 1825  . losartan (COZAAR) tablet 25 mg  25 mg Oral Daily Laszlo Ellerby, [name], MD   25 mg at 06/12/17 0831  . magnesium hydroxide (MILK OF MAGNESIA) suspension 30 mL  30 mL Oral Daily PRN Zedric Deroy T, MD      . metFORMIN (GLUCOPHAGE) tablet 500 mg  500 mg Oral BID WC Roberta Kelly, [name], MD   500 mg at 06/12/17 1824  . multivitamin with minerals tablet 1 tablet  1 tablet Oral Daily Dragan Tamburrino, [name], MD   1 tablet at 06/12/17 0831  . nadolol (CORGARD) tablet 20 mg  20 mg Oral Daily Jorian Willhoite, [name], MD   20 mg at 06/12/17 0831  . pantoprazole (PROTONIX) EC tablet 40 mg  40 mg Oral Daily Marnisha Stampley, [name], MD   40 mg at 06/12/17 0831  . protein supplement (PREMIER PROTEIN) liquid  11 oz Oral BID BM Maegan Buller T, MD   11 oz at 06/12/17 1440  . rifaximin (XIFAXAN) tablet 550 mg  550 mg Oral BID Welden Hausmann, [name], MD   550 mg at 06/12/17 1826  . sucralfate (CARAFATE) tablet 1 g  1 g Oral TID WC & HS Christinea Brizuela, [name], MD   1 g at 06/12/17  1825  . thiamine (VITAMIN B-1) tablet 100 mg  100 mg Oral Daily Jerrianne Hartin T, MD   100 mg at 06/12/17 0831    Lab Results:  No results found for this or any previous visit (from the past 48 hour(s)).  Blood Alcohol level:  Lab Results  Component Value Date   ETH <5 06/08/2017   ETH <5 04/22/2017    Metabolic Disorder Labs: Lab Results  Component Value Date   HGBA1C 5.7 (H) 06/10/2017   MPG 116.89 06/10/2017   MPG 151 12/12/2016   No results found for: PROLACTIN Lab Results  Component Value Date   CHOL 199 06/10/2017   TRIG 97 06/10/2017   HDL 37 (L) 06/10/2017   CHOLHDL 5.4 06/10/2017   VLDL 19 06/10/2017   LDLCALC 143 (H) 06/10/2017   LDLCALC 121 (H) 01/19/2017    Physical Findings: AIMS: Facial and Oral Movements Muscles of Facial Expression: None, normal Lips and Perioral Area: Mild Jaw: None, normal Tongue: None, normal,Extremity Movements Upper (arms, wrists, hands, fingers): Moderate Lower (legs, knees, ankles, toes): None, normal, Trunk Movements Neck, shoulders, hips: None, normal, Overall Severity Severity of abnormal movements (highest score from questions above): Severe Incapacitation due to abnormal movements: None, normal Patient's awareness of abnormal movements (rate only patient's report): Aware, no distress, Dental Status Current problems with teeth and/or dentures?: Yes Does patient usually wear dentures?: No  CIWA:    COWS:     Musculoskeletal: Strength & Muscle Tone: within normal limits Gait & Station: shuffle Patient leans: N/A  Psychiatric Specialty Exam: Physical Exam  Nursing note and vitals reviewed. Constitutional: He appears well-developed and well-nourished.  HENT:  Head: Normocephalic and atraumatic.  Eyes: Pupils are equal, round, and reactive to light. Conjunctivae are normal.  Neck: Normal range of motion.  Cardiovascular: Regular rhythm and normal heart sounds.   Respiratory: Effort normal. No respiratory distress.   GI: Soft.  Musculoskeletal: Normal range of motion.  Neurological: He is alert.  Skin: Skin is warm and dry.  Psychiatric: Judgment normal. His affect is blunt. His speech is delayed. He is slowed and actively hallucinating.  Cognition and memory are impaired. He expresses no homicidal and no suicidal ideation.    Review of Systems  Constitutional: Negative.   HENT: Negative.   Eyes: Negative.   Respiratory: Negative.   Cardiovascular: Negative.   Gastrointestinal: Negative.   Musculoskeletal: Negative.   Skin: Negative.   Neurological: Negative.   Psychiatric/Behavioral: Positive for depression and hallucinations. Negative for memory loss, substance abuse and suicidal ideas. The patient is not nervous/anxious and does not have insomnia.     Blood pressure (!) 144/86, pulse 67, temperature 98.1 F (36.7 C), temperature source Oral, resp. rate 18, height  (1.905 m), weight (!) 149.7 kg (330 lb), SpO2 97 %.Body mass index is 41.25 kg/m.  General Appearance: Casual  Eye Contact:  Good  Speech:  Slow  Volume:  Decreased  Mood:  Dysphoric  Affect:  Appropriate and Congruent  Thought Process:  Goal Directed  Orientation:  Full (Time, Place, and Person)  Thought Content:  Hallucinations: Auditory  Suicidal Thoughts:  No  Homicidal Thoughts:  No  Memory:  Immediate;   Good Recent;   Fair Remote;   Fair  Judgement:  Fair  Insight:  Fair  Psychomotor Activity:  Decreased  Concentration:  Concentration: Fair  Recall:  [employer] of Knowledge:  Fair  Language:  Fair  Akathisia:  No  Handed:  Right  AIMS (if indicated):     Assets:  Desire for Improvement Housing Resilience Social Support  ADL's:  Intact  Cognition:  Impaired,  Mild  Sleep:  Number of Hours: 7.75     Treatment Plan SummarPatient is definitely looking better. I am increasing the clozapine to  tonight. I'm not sure how much of this is the clozapine and how much of it is being away from the alcohol  but he certainly looks brighter and more interactive. Lucid. Not focused on negativity. Patient will continue in the hospital while we stabilize the medication. Estimated length of stay still about 3-4 days.Daily contact with patient to assess and evaluate symptoms and progress in treatment, Medication management and Plan Patient is definitely looking better. I am increasing the clozapine to  tonight. I'm not sure how much of this is the clozapine and how much of it is being away from the alcohol but he certainly looks brighter and more interactive. Lucid. Not focused on negativity. Patient will continue in the hospital while we stabilize the medication. Estimated length of stay still about 3-4 days.  Mordecai Rasmussen, MD 06/12/2017, 7:39 PM

## 2017-06-13 NOTE — Progress Notes (Signed)
Pt denies SI, HI, a/v hallucinations. He is calm and cooperative very polite with staff. Pt reported having anxiety pt given prn vistaril. He is medication, meal, and group compliant. Will continue to monitor.

## 2017-06-13 NOTE — Plan of Care (Signed)
Problem: Safety: Goal: Ability to remain free from injury will improve Outcome: Progressing Pt remains safe while in hospital injury free.    

## 2017-06-13 NOTE — Plan of Care (Signed)
Problem: Safety: Goal: Periods of time without injury will increase Outcome: Progressing Patient was observed in the dayroom and walking in the halls.  His gait was steady but he did stay toward the walls when walking.  He denies SI,HI, A/VH and contracts for safety.  Problem: Education: Goal: Will be free of psychotic symptoms Outcome: Progressing Patient was clear and organized in brief encounters with staff.  He was observed engaging with a peer and appeared calm and relaxed.  During medication administration before bed, he reported "anxiety 10/10" and stated he was "triggered by too many people in the dayroom at snack".  He was given Vistaril for anxiety and was observed sleeping by 2215.

## 2017-06-13 NOTE — Progress Notes (Signed)
Patient ID: Howard [name], male   DOB: 1980/09/29, 36 y.o.   MRN: [id number] LCSW Group Therapy Note  06/13/2017 1:00pm  Type of Therapy and Topic:  Group Therapy:  Cognitive Distortions  Participation Level:  Minimal   Description of Group:    Patients in this group will be introduced to the topic of cognitive distortions.  Patients will identify and describe cognitive distortions, describe the feelings these distortions create for them.  Patients will identify one or more situations in their personal life where they have cognitively distorted thinking and will verbalize challenging this cognitive distortion through positive thinking skills.  Patients will practice the skill of using positive affirmations to challenge cognitive distortions using affirmation cards.    Therapeutic Goals:  1. Patient will identify two or more cognitive distortions they have used 2. Patient will identify one or more emotions that stem from use of a cognitive distortion 3. Patient will demonstrate use of a positive affirmation to counter a cognitive distortion through discussion and/or role play. 4. Patient will describe one way cognitive distortions can be detrimental to wellness   Summary of Patient Progress:  Pt able to meet the above therapeutic goals.  Pt verbalizes that he can use positive affirmations to help with some of his negative thoughts.  CSW utilized "Unhelpful thinking" handout and "Power Thought" cards to demonstrate benefits of affirmations.   Therapeutic Modalities:   Cognitive Behavioral Therapy Motivational Interviewing   Glennon Mac, LCSW 06/13/2017 3:53 PM

## 2017-06-13 NOTE — Progress Notes (Signed)
BDaily contact with patient to assess and evaluate symptoms and progress in treatment, Medication management and Plan  [facility] MD Progress Note  06/13/2017 3:00 PM Howard [name]  MRN:  [id number] Subjective:  Follow-up for Friday the 21st. Patient was seen and chart reviewed. Patient says he is feeling significantly better today. He is no longer feeling sad. He still has auditory hallucinations but says they are less frequent. His affect is brighter. He is attending groups and interacting with others. He looks like he is taking care of himself fairly well. I'm glad that he is tolerating the clozapine and says he has very few to no side effects  Follow-up for Saturday the 22nd. Patient says his mood is feeling much better. Denies suicidal ideation. Feels that his hallucinations are getting better. Patient's side effects include some drooling although not excessively at this point. Feels a little bit tired. Constipation not a problem. Principal Problem: Schizophrenia, paranoid (HCC) Diagnosis:   Patient Active Problem List   Diagnosis Date Noted  . Schizophrenia, paranoid (HCC) [F20.0] 06/09/2017  . PTSD (post-traumatic stress disorder) [F43.10] 06/08/2017  . Inflamed external hemorrhoid [K64.4] 05/13/2017  . End stage liver disease (HCC) [K72.90] 04/24/2017  . Esophageal varices without bleeding (HCC) [I85.00]   . Hematemesis [K92.0] 04/05/2017  . Upper GI bleed [K92.2]   . Iron deficiency anemia due to chronic blood loss [D50.0] 02/16/2017  . Nausea [R11.0] 01/18/2017  . Respiratory failure with hypoxia (HCC) [J96.91] 12/14/2016  . Seizures (HCC) [R56.9] 12/12/2016  . DNR (do not resuscitate) discussion [Z71.89] 08/11/2016  . Muscle weakness (generalized) [M62.81]   . GI bleed [K92.2] 08/07/2016  . OSA (obstructive sleep apnea) [G47.33] 04/29/2016  . Anemia [D64.9] 04/29/2016  . Thrombocytopenia (HCC) [D69.6] 04/29/2016  . Coagulopathy (HCC) [D68.9] 04/29/2016  . Obesity [E66.9]  04/29/2016  . Controlled type 2 diabetes mellitus without complication (HCC) [E11.9] 01/15/2016  . Essential (primary) hypertension [I10] 12/11/2015  . Pure hypercholesterolemia [E78.00] 12/11/2015  . Hepatic encephalopathy (HCC) [K72.90] 10/16/2015  . 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 Elevated transaminase level [R74.0] 07/04/2015  . Alcohol abuse [F10.10] 05/31/2015  . Paranoid schizophrenia (HCC) [F20.0] 03/20/2015   Total Time spent with patient: 20 minutes  Past Psychiatric History: Patient has a long history of schizophrenia and a more recent history of alcohol abuse which rapidly progressed to the point of cirrhosis. Past history of suicidality but has been quite a while since he was in the hospital. Has failed several antipsychotic medicines.  Past Medical History:  Past Medical History:  Diagnosis Date  . Alcoholic cirrhosis of liver with ascites (HCC)   . Alcoholism (HCC)   . Anemia   . Atrial fibrillation (HCC)   . COPD (chronic obstructive pulmonary disease) (HCC)   . Depression   . Diabetes mellitus, type II (HCC)   . Esophageal varices (HCC)   . Heart disease    irregular heart beat (palpitations) and heart murmur  . Hyperlipemia   . Hypertension   . Liver disease   . Multiple thyroid nodules   . Portal hypertensive gastropathy (HCC)   . Schizophrenia (HCC)   . Seizures (HCC)     Past Surgical History:  Procedure Laterality Date  . ESOPHAGOGASTRODUODENOSCOPY N/A 10/05/2015   Procedure: ESOPHAGOGASTRODUODENOSCOPY (EGD);  Surgeon: Elnita Maxwell, MD;  Location: [facility] ENDOSCOPY;  Service: Endoscopy;  Laterality: N/A;  . ESOPHAGOGASTRODUODENOSCOPY (EGD) WITH PROPOFOL N/A 08/03/2015   Procedure: ESOPHAGOGASTRODUODENOSCOPY (EGD) WITH PROPOFOL;  Surgeon: Elnita Maxwell,  MD;  Location: [facility] ENDOSCOPY;  Service: Endoscopy;  Laterality: N/A;  . ESOPHAGOGASTRODUODENOSCOPY (EGD) WITH PROPOFOL N/A 08/31/2015   Procedure:  ESOPHAGOGASTRODUODENOSCOPY (EGD) WITH PROPOFOL;  Surgeon: Elnita Maxwell, MD;  Location: [facility] ENDOSCOPY;  Service: Endoscopy;  Laterality: N/A;  . ESOPHAGOGASTRODUODENOSCOPY (EGD) WITH PROPOFOL N/A 04/04/2016   Procedure: ESOPHAGOGASTRODUODENOSCOPY (EGD) WITH PROPOFOL;  Surgeon: Scot Jun, MD;  Location: [facility] ENDOSCOPY;  Service: Endoscopy;  Laterality: N/A;  . ESOPHAGOGASTRODUODENOSCOPY (EGD) WITH PROPOFOL N/A 11/13/2016   Procedure: ESOPHAGOGASTRODUODENOSCOPY (EGD) WITH PROPOFOL;  Surgeon: Wyline Mood, MD;  Location: [facility] ENDOSCOPY;  Service: Endoscopy;  Laterality: N/A;  . ESOPHAGOGASTRODUODENOSCOPY (EGD) WITH PROPOFOL N/A 04/07/2017   Procedure: ESOPHAGOGASTRODUODENOSCOPY (EGD) WITH PROPOFOL;  Surgeon: Midge Minium, MD;  Location: [facility] ENDOSCOPY;  Service: Endoscopy;  Laterality: N/A;  . NO PAST SURGERIES     Family History:  Family History  Problem Relation Age of Onset  . Heart disease Mother   . Hypertension Mother   . Hyperlipidemia Mother   . Stroke Father   . Heart attack Father   . Hypertension Father   . Heart disease Father   . Alcohol abuse Father   . Heart disease Brother    Family Psychiatric  History: I believe he does have a relative who also has a major mental health problem and his brother seems to have some disability Social History:  History  Alcohol Use  . 0.0 oz/week    Comment: last drink 1 days ago (noted: 05/30/2017)     History  Drug Use No    Social History   Social History  . Marital status: Single    Spouse name: N/A  . Number of children: N/A  . Years of education: N/A   Occupational History  . disabled    Social History Main Topics  . Smoking status: Never Smoker  . Smokeless tobacco: Never Used  . Alcohol use 0.0 oz/week     Comment: last drink 1 days ago (noted: 05/30/2017)  . Drug use: No  . Sexual activity: No   Other Topics Concern  . None   Social History Narrative   ** Merged History Encounter **       Additional  Social History:                         Sleep: Fair  Appetite:  Fair  Current Medications: Current Facility-Administered Medications  Medication Dose Route Frequency Provider Last Rate Last Dose  . acamprosate (CAMPRAL) tablet 666 mg  666 mg Oral TID WC [name], [name], MD   666 mg at 06/13/17 1234  . albuterol (PROVENTIL HFA;VENTOLIN HFA) 108 (90 Base) MCG/ACT inhaler 2 puff  2 puff Inhalation Q4H PRN [name], [name], MD      . alum & mag hydroxide-simeth (MAALOX/MYLANTA) 200-200-20 MG/5ML suspension 30 mL  30 mL Oral Q4H PRN [name], [name], MD      . cloZAPine (CLOZARIL) tablet 100 mg  100 mg Oral QHS [name], [name], MD   100 mg at 06/12/17 2116  . ferrous sulfate tablet 325 mg  325 mg Oral Q breakfast [name], [name], MD   325 mg at 06/13/17 0817  . folic acid (FOLVITE) tablet 1 mg  1 mg Oral Daily [name], [name], MD   1 mg at 06/13/17 0818  . hydrOXYzine (ATARAX/VISTARIL) tablet 25 mg  25 mg Oral TID PRN [name], [name], MD   25 mg at 06/13/17 1224  .  ibuprofen (ADVIL,MOTRIN) tablet 600 mg  600 mg Oral Q6H PRN Pucilowska, Jolanta B, MD   600 mg at 06/13/17 0817  . lactulose (CHRONULAC) 10 GM/15ML solution 45 g  45 g Oral TID [name], [name], MD   45 g at 06/13/17 1234  . levETIRAcetam (KEPPRA) tablet 1,500 mg  1,500 mg Oral BID [name], [name], MD   1,500 mg at 06/13/17 0818  . losartan (COZAAR) tablet 25 mg  25 mg Oral Daily [name], [name], MD   25 mg at 06/13/17 0817  . magnesium hydroxide (MILK OF MAGNESIA) suspension 30 mL  30 mL Oral Daily PRN [name], [name], MD      . metFORMIN (GLUCOPHAGE) tablet 500 mg  500 mg Oral BID WC [name], [name], MD   500 mg at 06/13/17 0818  . multivitamin with minerals tablet 1 tablet  1 tablet Oral Daily [name], [name], MD   1 tablet at 06/13/17 0818  . nadolol (CORGARD) tablet 20 mg  20 mg Oral Daily [name], [name], MD   20 mg at 06/13/17 0817  . pantoprazole (PROTONIX) EC tablet 40 mg  40 mg Oral Daily [name], [name], MD   40 mg at  06/13/17 0818  . protein supplement (PREMIER PROTEIN) liquid  11 oz Oral BID BM [name], [name], MD   11 oz at 06/13/17 1235  . rifaximin (XIFAXAN) tablet 550 mg  550 mg Oral BID [name], [name], MD   550 mg at 06/13/17 0817  . sucralfate (CARAFATE) tablet 1 g  1 g Oral TID WC & HS [name], [name], MD   1 g at 06/13/17 1220  . thiamine (VITAMIN B-1) tablet 100 mg  100 mg Oral Daily [name], [name], MD   100 mg at 06/13/17 7829    Lab Results:  No results found for this or any previous visit (from the past 48 hour(s)).  Blood Alcohol level:  Lab Results  Component Value Date   ETH <5 06/08/2017   ETH <5 04/22/2017    Metabolic Disorder Labs: Lab Results  Component Value Date   HGBA1C 5.7 (H) 06/10/2017   MPG 116.89 06/10/2017   MPG 151 12/12/2016   No results found for: PROLACTIN Lab Results  Component Value Date   CHOL 199 06/10/2017   TRIG 97 06/10/2017   HDL 37 (L) 06/10/2017   CHOLHDL 5.4 06/10/2017   VLDL 19 06/10/2017   LDLCALC 143 (H) 06/10/2017   LDLCALC 121 (H) 01/19/2017    Physical Findings: AIMS: Facial and Oral Movements Muscles of Facial Expression: None, normal Lips and Perioral Area: Mild Jaw: None, normal Tongue: None, normal,Extremity Movements Upper (arms, wrists, hands, fingers): Moderate Lower (legs, knees, ankles, toes): None, normal, Trunk Movements Neck, shoulders, hips: None, normal, Overall Severity Severity of abnormal movements (highest score from questions above): Severe Incapacitation due to abnormal movements: None, normal Patient's awareness of abnormal movements (rate only patient's report): Aware, no distress, Dental Status Current problems with teeth and/or dentures?: Yes Does patient usually wear dentures?: No  CIWA:    COWS:     Musculoskeletal: Strength & Muscle Tone: within normal limits Gait & Station: shuffle Patient leans: N/A  Psychiatric Specialty Exam: Physical Exam  Nursing note and vitals  reviewed. Constitutional: He appears well-developed and well-nourished.  HENT:  Head: Normocephalic and atraumatic.  Eyes: Pupils are equal, round, and reactive to light. Conjunctivae are normal.  Neck: Normal range of motion.  Cardiovascular: Regular rhythm and normal  heart sounds.   Respiratory: Effort normal. No respiratory distress.  GI: Soft.  Musculoskeletal: Normal range of motion.  Neurological: He is alert.  Skin: Skin is warm and dry.  Psychiatric: Judgment normal. His affect is blunt. His speech is delayed. He is slowed and actively hallucinating. Cognition and memory are impaired. He expresses no homicidal and no suicidal ideation.    Review of Systems  Constitutional: Negative.   HENT: Negative.   Eyes: Negative.   Respiratory: Negative.   Cardiovascular: Negative.   Gastrointestinal: Negative.   Musculoskeletal: Negative.   Skin: Negative.   Neurological: Negative.   Psychiatric/Behavioral: Positive for depression and hallucinations. Negative for memory loss, substance abuse and suicidal ideas. The patient is not nervous/anxious and does not have insomnia.     Blood pressure (!) 145/82, pulse 77, temperature (!) 97.1 F (36.2 C), temperature source Oral, resp. rate 18, height  (1.905 m), weight (!) 149.7 kg (330 lb), SpO2 97 %.Body mass index is 41.25 kg/m.  General Appearance: Casual  Eye Contact:  Good  Speech:  Slow  Volume:  Decreased  Mood:  Dysphoric  Affect:  Appropriate and Congruent  Thought Process:  Goal Directed  Orientation:  Full (Time, Place, and Person)  Thought Content:  Hallucinations: Auditory  Suicidal Thoughts:  No  Homicidal Thoughts:  No  Memory:  Immediate;   Good Recent;   Fair Remote;   Fair  Judgement:  Fair  Insight:  Fair  Psychomotor Activity:  Decreased  Concentration:  Concentration: Fair  Recall:  [employer] of Knowledge:  Fair  Language:  Fair  Akathisia:  No  Handed:  Right  AIMS (if indicated):     Assets:   Desire for Improvement Housing Resilience Social Support  ADL's:  Intact  Cognition:  Impaired,  Mild  Sleep:  Number of Hours: 8     Treatment Plan SummarPatient is definitely looking better. I am increasing the clozapine to  tonight. I'm not sure how much of this is the clozapine and how much of it is being away from the alcohol but he certainly looks brighter and more interactive. Lucid. Not focused on negativity. Patient will continue in the hospital while we stabilize the medication. Estimated length of stay still about 3-4 days.because of his impaired liver function I will not go higher than the 100 mg of clozapine at this point because I don't want him to have excessive side effects. Continue engagement in groups and activities. Supportive counseling completed. No other change to medication. Patient should also be included in groups and treatment for substance abuse  Mordecai Rasmussen, MD 06/13/2017, 3:00 PM

## 2017-06-13 NOTE — [facility] Group Notes (Signed)
[facility] Group Notes:  (Nursing/MHT/Case Management/Adjunct)  Date:  06/13/2017  Time:  1:05 AM  Type of Therapy:  Psychoeducational Skills  Participation Level:  Active  Participation Quality:  Appropriate, Attentive and Sharing  Affect:  Appropriate  Cognitive:  Appropriate  Insight:  Appropriate and Good  Engagement in Group:  Engaged  Modes of Intervention:  Discussion, Socialization and Support  Summary of Progress/Problems:  Howard [name] 06/13/2017, 1:05 AM

## 2017-06-14 MED ORDER — CLOZAPINE 25 MG PO TABS
125.0000 mg | ORAL_TABLET | Freq: Every day | ORAL | Status: DC
  Administered 2017-06-14 – 2017-06-15 (×2): 125 mg via ORAL
  Filled 2017-06-14 (×2): qty 1

## 2017-06-14 NOTE — Progress Notes (Signed)
Patient  up ad lib ambulating with his walker. Compliant with his medications and is active in the community setting. Patient verbalized some anxiety this afternoon. PRN medications administered with effectiveness. Patient stated that the medication helps him be able to interact with others without having a panic attack. Patient observed in the community interacting with select peers appropriately. Endorses auditory hallucinations at times but denies hearing any at present time.

## 2017-06-14 NOTE — [facility] Group Notes (Signed)
[facility] LCSW Group Therapy 06/14/2017 1:15pm  Type of Therapy: Group Therapy- Feelings Around Discharge & Establishing a Supportive Framework  Participation Level:  None  Description of Group:   What is a supportive framework? What does it look like feel like and how do I discern it from and unhealthy non-supportive network? Learn how to cope when supports are not helpful and don't support you. Discuss what to do when your family/friends are not supportive.  Summary of Patient Progress Pt was attentive in discussion but did not participate.   Therapeutic Modalities:   Cognitive Behavioral Therapy Person-Centered Therapy Motivational Interviewing   Verdene Lennert, LCSW 06/14/2017 2:36 PM

## 2017-06-14 NOTE — Plan of Care (Signed)
Problem: Safety: Goal: Periods of time without injury will increase Outcome: Progressing Patient is observed using his walker and is active on the unit, walking in the hall or sitting with another patient in the dayroom.  He was given positive feedback for compliance.  Patient took medications by 2130 and was observed sleeping by 2215.  Problem: Education: Goal: Will be free of psychotic symptoms Outcome: Progressing Patient asked appropriate questions about medications and listened intently to teaching.  He requested "prn for anxiety, 10/10".  Medication was effective.    Problem: Safety: Goal: Ability to remain free from injury will improve Outcome: Progressing Patient denies SI/HI/AV/H and contracts for safety.

## 2017-06-14 NOTE — Plan of Care (Signed)
Problem: Safety: Goal: Ability to remain free from injury will improve Outcome: Progressing Patient remains free from injury on the unit.  Problem: Education: Goal: Knowledge of the prescribed therapeutic regimen will improve Outcome: Progressing Patient is knowledgeable of prescribed medications.  Problem: Coping: Goal: Ability to verbalize feelings will improve Outcome: Progressing Patient is able to verbalize needs to staff.

## 2017-06-14 NOTE — Plan of Care (Signed)
Problem: Safety: Goal: Periods of time without injury will increase Outcome: Progressing Patient is observed using his walker to ambulate in the hall and in the dayroom.  He seems to appreciate positive feedback on his compliance.  Patient remains safe.  Problem: Education: Goal: Knowledge of the prescribed therapeutic regimen will improve Outcome: Progressing Patient engages with writer in the medication room and asks appropriate questions about his medications.  He is pleasant and cooperative.  He again reports anxiety at bedtime and requests Vistaril for anxiety 10/10.  He was observed sleeping by 2200.

## 2017-06-14 NOTE — Progress Notes (Signed)
BDaily contact with patient to assess and evaluate symptoms and progress in treatment, Medication management and Plan  [facility] MD Progress Note  06/14/2017 2:38 PM Howard [name]  MRN:  [id number] Subjective:  Follow-up for Friday the 21st. Patient was seen and chart reviewed. Patient says he is feeling significantly better today. He is no longer feeling sad. He still has auditory hallucinations but says they are less frequent. His affect is brighter. He is attending groups and interacting with others. He looks like he is taking care of himself fairly well. I'm glad that he is tolerating the clozapine and says he has very few to no side effects  Follow-up for Saturday the 22nd. Patient says his mood is feeling much better. Denies suicidal ideation. Feels that his hallucinations are getting better. Patient's side effects include some drooling although not excessively at this point. Feels a little bit tired. Constipation not a problem.  Follow-up for Sunday the 23rd. Patient says his mood is much better and he is not feeling depressed although later in the morning he talked with me about how it bothered him that other patients don't seem to want to interact much with him. Patient seems to been making an effort to socialize but feels like people are not is willing to engage with him as they are with some of the other patients. He denies suicidal thoughts. Hallucinations are still present but quieter. Side effects less prominent today. Principal Problem: Schizophrenia, paranoid (HCC) Diagnosis:   Patient Active Problem List   Diagnosis Date Noted  . Schizophrenia, paranoid (HCC) [F20.0] 06/09/2017  . PTSD (post-traumatic stress disorder) [F43.10] 06/08/2017  . Inflamed external hemorrhoid [K64.4] 05/13/2017  . End stage liver disease (HCC) [K72.90] 04/24/2017  . Esophageal varices without bleeding (HCC) [I85.00]   . Hematemesis [K92.0] 04/05/2017  . Upper GI bleed [K92.2]   . Iron deficiency anemia due  to chronic blood loss [D50.0] 02/16/2017  . Nausea [R11.0] 01/18/2017  . Respiratory failure with hypoxia (HCC) [J96.91] 12/14/2016  . Seizures (HCC) [R56.9] 12/12/2016  . DNR (do not resuscitate) discussion [Z71.89] 08/11/2016  . Muscle weakness (generalized) [M62.81]   . GI bleed [K92.2] 08/07/2016  . OSA (obstructive sleep apnea) [G47.33] 04/29/2016  . Anemia [D64.9] 04/29/2016  . Thrombocytopenia (HCC) [D69.6] 04/29/2016  . Coagulopathy (HCC) [D68.9] 04/29/2016  . Obesity [E66.9] 04/29/2016  . Controlled type 2 diabetes mellitus without complication (HCC) [E11.9] 01/15/2016  . Essential (primary) hypertension [I10] 12/11/2015  . Pure hypercholesterolemia [E78.00] 12/11/2015  . Hepatic encephalopathy (HCC) [K72.90] 10/16/2015  . 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 Elevated transaminase level [R74.0] 07/04/2015  . Alcohol abuse [F10.10] 05/31/2015  . Paranoid schizophrenia (HCC) [F20.0] 03/20/2015   Total Time spent with patient: 30 minutes  Past Psychiatric History: Patient has a long history of schizophrenia and a more recent history of alcohol abuse which rapidly progressed to the point of cirrhosis. Past history of suicidality but has been quite a while since he was in the hospital. Has failed several antipsychotic medicines.  Past Medical History:  Past Medical History:  Diagnosis Date  . Alcoholic cirrhosis of liver with ascites (HCC)   . Alcoholism (HCC)   . Anemia   . Atrial fibrillation (HCC)   . COPD (chronic obstructive pulmonary disease) (HCC)   . Depression   . Diabetes mellitus, type II (HCC)   . Esophageal varices (HCC)   . Heart disease    irregular heart beat (palpitations) and heart murmur  .  Hyperlipemia   . Hypertension   . Liver disease   . Multiple thyroid nodules   . Portal hypertensive gastropathy (HCC)   . Schizophrenia (HCC)   . Seizures (HCC)     Past Surgical History:  Procedure Laterality Date  .  ESOPHAGOGASTRODUODENOSCOPY N/A 10/05/2015   Procedure: ESOPHAGOGASTRODUODENOSCOPY (EGD);  Surgeon: Elnita Maxwell, MD;  Location: [facility] ENDOSCOPY;  Service: Endoscopy;  Laterality: N/A;  . ESOPHAGOGASTRODUODENOSCOPY (EGD) WITH PROPOFOL N/A 08/03/2015   Procedure: ESOPHAGOGASTRODUODENOSCOPY (EGD) WITH PROPOFOL;  Surgeon: Elnita Maxwell, MD;  Location: [facility] ENDOSCOPY;  Service: Endoscopy;  Laterality: N/A;  . ESOPHAGOGASTRODUODENOSCOPY (EGD) WITH PROPOFOL N/A 08/31/2015   Procedure: ESOPHAGOGASTRODUODENOSCOPY (EGD) WITH PROPOFOL;  Surgeon: Elnita Maxwell, MD;  Location: [facility] ENDOSCOPY;  Service: Endoscopy;  Laterality: N/A;  . ESOPHAGOGASTRODUODENOSCOPY (EGD) WITH PROPOFOL N/A 04/04/2016   Procedure: ESOPHAGOGASTRODUODENOSCOPY (EGD) WITH PROPOFOL;  Surgeon: Scot Jun, MD;  Location: [facility] ENDOSCOPY;  Service: Endoscopy;  Laterality: N/A;  . ESOPHAGOGASTRODUODENOSCOPY (EGD) WITH PROPOFOL N/A 11/13/2016   Procedure: ESOPHAGOGASTRODUODENOSCOPY (EGD) WITH PROPOFOL;  Surgeon: Wyline Mood, MD;  Location: [facility] ENDOSCOPY;  Service: Endoscopy;  Laterality: N/A;  . ESOPHAGOGASTRODUODENOSCOPY (EGD) WITH PROPOFOL N/A 04/07/2017   Procedure: ESOPHAGOGASTRODUODENOSCOPY (EGD) WITH PROPOFOL;  Surgeon: Midge Minium, MD;  Location: [facility] ENDOSCOPY;  Service: Endoscopy;  Laterality: N/A;  . NO PAST SURGERIES     Family History:  Family History  Problem Relation Age of Onset  . Heart disease Mother   . Hypertension Mother   . Hyperlipidemia Mother   . Stroke Father   . Heart attack Father   . Hypertension Father   . Heart disease Father   . Alcohol abuse Father   . Heart disease Brother    Family Psychiatric  History: I believe he does have a relative who also has a major mental health problem and his brother seems to have some disability Social History:  History  Alcohol Use  . 0.0 oz/week    Comment: last drink 1 days ago (noted: 05/30/2017)     History  Drug Use No    Social History    Social History  . Marital status: Single    Spouse name: N/A  . Number of children: N/A  . Years of education: N/A   Occupational History  . disabled    Social History Main Topics  . Smoking status: Never Smoker  . Smokeless tobacco: Never Used  . Alcohol use 0.0 oz/week     Comment: last drink 1 days ago (noted: 05/30/2017)  . Drug use: No  . Sexual activity: No   Other Topics Concern  . None   Social History Narrative   ** Merged History Encounter **       Additional Social History:                         Sleep: Fair  Appetite:  Fair  Current Medications: Current Facility-Administered Medications  Medication Dose Route Frequency Provider Last Rate Last Dose  . acamprosate (CAMPRAL) tablet 666 mg  666 mg Oral TID WC [name], [name], MD   666 mg at 06/14/17 1158  . albuterol (PROVENTIL HFA;VENTOLIN HFA) 108 (90 Base) MCG/ACT inhaler 2 puff  2 puff Inhalation Q4H PRN [name], [name], MD      . alum & mag hydroxide-simeth (MAALOX/MYLANTA) 200-200-20 MG/5ML suspension 30 mL  30 mL Oral Q4H PRN [name], [name], MD      . cloZAPine (CLOZARIL) tablet 100 mg  100 mg Oral QHS [name], [name], MD   100 mg at 06/13/17 2110  . ferrous sulfate tablet 325 mg  325 mg Oral Q breakfast [name], [name], MD   325 mg at 06/14/17 1610  . folic acid (FOLVITE) tablet 1 mg  1 mg Oral Daily [name], [name], MD   1 mg at 06/14/17 9604  . hydrOXYzine (ATARAX/VISTARIL) tablet 25 mg  25 mg Oral TID PRN [name], [name], MD   25 mg at 06/14/17 0836  . ibuprofen (ADVIL,MOTRIN) tablet 600 mg  600 mg Oral Q6H PRN Pucilowska, Jolanta B, MD   600 mg at 06/14/17 1201  . lactulose (CHRONULAC) 10 GM/15ML solution 45 g  45 g Oral TID [name], [name], MD   45 g at 06/14/17 1157  . levETIRAcetam (KEPPRA) tablet 1,500 mg  1,500 mg Oral BID [name], [name], MD   1,500 mg at 06/14/17 5409  . losartan (COZAAR) tablet 25 mg  25 mg Oral Daily [name], [name], MD   25 mg at 06/14/17 0834  . magnesium  hydroxide (MILK OF MAGNESIA) suspension 30 mL  30 mL Oral Daily PRN [name], [name], MD      . metFORMIN (GLUCOPHAGE) tablet 500 mg  500 mg Oral BID WC [name], [name], MD   500 mg at 06/14/17 8119  . multivitamin with minerals tablet 1 tablet  1 tablet Oral Daily [name], [name], MD   1 tablet at 06/14/17 1478  . nadolol (CORGARD) tablet 20 mg  20 mg Oral Daily [name], [name], MD   20 mg at 06/14/17 2956  . pantoprazole (PROTONIX) EC tablet 40 mg  40 mg Oral Daily [name], [name], MD   40 mg at 06/14/17 2130  . protein supplement (PREMIER PROTEIN) liquid  11 oz Oral BID BM [name], [name], MD   11 oz at 06/14/17 1400  . rifaximin (XIFAXAN) tablet 550 mg  550 mg Oral BID [name], [name], MD   550 mg at 06/14/17 8657  . sucralfate (CARAFATE) tablet 1 g  1 g Oral TID WC & HS [name], [name], MD   1 g at 06/14/17 1158  . thiamine (VITAMIN B-1) tablet 100 mg  100 mg Oral Daily [name], [name], MD   100 mg at 06/14/17 8469    Lab Results:  No results found for this or any previous visit (from the past 48 hour(s)).  Blood Alcohol level:  Lab Results  Component Value Date   ETH <5 06/08/2017   ETH <5 04/22/2017    Metabolic Disorder Labs: Lab Results  Component Value Date   HGBA1C 5.7 (H) 06/10/2017   MPG 116.89 06/10/2017   MPG 151 12/12/2016   No results found for: PROLACTIN Lab Results  Component Value Date   CHOL 199 06/10/2017   TRIG 97 06/10/2017   HDL 37 (L) 06/10/2017   CHOLHDL 5.4 06/10/2017   VLDL 19 06/10/2017   LDLCALC 143 (H) 06/10/2017   LDLCALC 121 (H) 01/19/2017    Physical Findings: AIMS: Facial and Oral Movements Muscles of Facial Expression: None, normal Lips and Perioral Area: Mild Jaw: None, normal Tongue: None, normal,Extremity Movements Upper (arms, wrists, hands, fingers): Moderate Lower (legs, knees, ankles, toes): None, normal, Trunk Movements Neck, shoulders, hips: None, normal, Overall Severity Severity of abnormal movements (highest score from  questions above): Severe Incapacitation due to abnormal movements: None, normal Patient's awareness of abnormal movements (rate only patient's report): Aware, no distress, Dental  Status Current problems with teeth and/or dentures?: Yes Does patient usually wear dentures?: No  CIWA:    COWS:     Musculoskeletal: Strength & Muscle Tone: within normal limits Gait & Station: shuffle Patient leans: N/A  Psychiatric Specialty Exam: Physical Exam  Nursing note and vitals reviewed. Constitutional: He appears well-developed and well-nourished.  HENT:  Head: Normocephalic and atraumatic.  Eyes: Pupils are equal, round, and reactive to light. Conjunctivae are normal.  Neck: Normal range of motion.  Cardiovascular: Regular rhythm and normal heart sounds.   Respiratory: Effort normal. No respiratory distress.  GI: Soft.  Musculoskeletal: Normal range of motion.  Neurological: He is alert.  Skin: Skin is warm and dry.  Psychiatric: Judgment normal. His affect is blunt. His speech is delayed. He is slowed and actively hallucinating. Cognition and memory are impaired. He expresses no homicidal and no suicidal ideation.    Review of Systems  Constitutional: Negative.   HENT: Negative.   Eyes: Negative.   Respiratory: Negative.   Cardiovascular: Negative.   Gastrointestinal: Negative.   Musculoskeletal: Negative.   Skin: Negative.   Neurological: Negative.   Psychiatric/Behavioral: Positive for depression and hallucinations. Negative for memory loss, substance abuse and suicidal ideas. The patient is not nervous/anxious and does not have insomnia.     Blood pressure 127/80, pulse 80, temperature 98.2 F (36.8 C), temperature source Oral, resp. rate 18, height  (1.905 m), weight (!) 149.7 kg (330 lb), SpO2 97 %.Body mass index is 41.25 kg/m.  General Appearance: Casual  Eye Contact:  Good  Speech:  Slow  Volume:  Decreased  Mood:  Dysphoric  Affect:  Appropriate and Congruent   Thought Process:  Goal Directed  Orientation:  Full (Time, Place, and Person)  Thought Content:  Hallucinations: Auditory  Suicidal Thoughts:  No  Homicidal Thoughts:  No  Memory:  Immediate;   Good Recent;   Fair Remote;   Fair  Judgement:  Fair  Insight:  Fair  Psychomotor Activity:  Decreased  Concentration:  Concentration: Fair  Recall:  [employer] of Knowledge:  Fair  Language:  Fair  Akathisia:  No  Handed:  Right  AIMS (if indicated):     Assets:  Desire for Improvement Housing Resilience Social Support  ADL's:  Intact  Cognition:  Impaired,  Mild  Sleep:  Number of Hours: 7     I am going to continue today increasing his clozapine up by another notch 225 mg. Encourage patient and gave him a lot of support and validation. No other change to medicine. Length of stay probably now in the range of 2-3 days  Mordecai Rasmussen, MD 06/14/2017, 2:38 PM

## 2017-06-15 ENCOUNTER — Ambulatory Visit: Admitting: Psychiatry

## 2017-06-15 LAB — CBC WITH DIFFERENTIAL/PLATELET
BASOS ABS: 0 10*3/uL (ref 0–0.1)
Basophils Relative: 1 %
EOS ABS: 0.1 10*3/uL (ref 0–0.7)
EOS PCT: 3 %
HCT: 35.9 % — ABNORMAL LOW (ref 40.0–52.0)
Hemoglobin: 11.9 g/dL — ABNORMAL LOW (ref 13.0–18.0)
LYMPHS ABS: 1.2 10*3/uL (ref 1.0–3.6)
Lymphocytes Relative: 24 %
MCH: 30.7 pg (ref 26.0–34.0)
MCHC: 33.2 g/dL (ref 32.0–36.0)
MCV: 92.6 fL (ref 80.0–100.0)
MONO ABS: 0.5 10*3/uL (ref 0.2–1.0)
Monocytes Relative: 11 %
Neutro Abs: 3.1 10*3/uL (ref 1.4–6.5)
Neutrophils Relative %: 61 %
PLATELETS: 94 10*3/uL — AB (ref 150–440)
RBC: 3.88 MIL/uL — AB (ref 4.40–5.90)
RDW: 15.7 % — AB (ref 11.5–14.5)
WBC: 5 10*3/uL (ref 3.8–10.6)

## 2017-06-15 NOTE — [facility] Group Notes (Signed)
[facility] Group Notes:  (Nursing/MHT/Case Management/Adjunct)  Date:  06/15/2017  Time:  10:15 AM  Type of Therapy:  Psychoeducational Skills  Participation Level:  Did Not Attend  Howard [name] Travis Meia Emley 06/15/2017, 10:15 AM 

## 2017-06-15 NOTE — Progress Notes (Signed)
Clozapine Monitoring:  CBC with differential obtained on 06/15/17. ANC 3100.   Labs entered into clozapine [facility] registry. Per registry, patient is eligible to receive clozapine with weekly lab monitoring. Will order another CBC w/diff on 06/22/17.  Clovia Cuff, PharmD, BCPS 06/15/2017 9:05 AM

## 2017-06-15 NOTE — [facility] Group Notes (Signed)
[facility] LCSW Group Therapy Note  Date/Time:06/15/17, 1300  Type of Therapy and Topic:  Group Therapy:  Overcoming Obstacles  Participation Level:  active  Description of Group:    In this group patients will be encouraged to explore what they see as obstacles to their own wellness and recovery. They will be guided to discuss their thoughts, feelings, and behaviors related to these obstacles. The group will process together ways to cope with barriers, with attention given to specific choices patients can make. Each patient will be challenged to identify changes they are motivated to make in order to overcome their obstacles. This group will be process-oriented, with patients participating in exploration of their own experiences as well as giving and receiving support and challenge from other group members.  Therapeutic Goals: 1. Patient will identify personal and current obstacles as they relate to admission. 2. Patient will identify barriers that currently interfere with their wellness or overcoming obstacles.  3. Patient will identify feelings, thought process and behaviors related to these barriers. 4. Patient will identify two changes they are willing to make to overcome these obstacles:    Summary of Patient Progress: Pt identified mental illness and alcohol use as current obstacles in his life and shared that he has started new medication that appears to be working and is also planning to attend substance abuse IOP at [facility] to address his alcohol issues.  Appropriate participation.       Therapeutic Modalities:   Cognitive Behavioral Therapy Solution Focused Therapy Motivational Interviewing Relapse Prevention Therapy  Daleen Squibb, LCSW

## 2017-06-15 NOTE — Progress Notes (Signed)
D: Pt denies SI/HI/AVH, affect is bright upon approach, very polite with staff, thoughts are organized, speech is coherent with some delayed response noted. Pt is cooperative with treatment plan, he appears less anxious and he is interacting with peers and staff appropriately.  A: Pt was offered support and encouragement. Pt was given scheduled medications. Pt was encouraged to attend groups. Q 15 minute checks were done for safety.  R:Pt attends groups and interacts well with peers and staff. Pt is taking medication. Pt has no complaints.Pt receptive to treatment and safety maintained on unit.

## 2017-06-15 NOTE — Plan of Care (Signed)
Problem: Coping: Goal: Ability to verbalize feelings will improve Outcome: Progressing Patient verbalized feelings to staff.    

## 2017-06-15 NOTE — Plan of Care (Signed)
Problem: Safety: Goal: Ability to remain free from injury will improve Outcome: Progressing Patient continues to be free from injury  Problem: Education: Goal: Will be free of psychotic symptoms Outcome: Progressing No symptoms identifided  Problem: Coping: Goal: Ability to verbalize feelings will improve Outcome: Progressing symptoms improved

## 2017-06-15 NOTE — Progress Notes (Signed)
BDaily contact with patient to assess and evaluate symptoms and progress in treatment, Medication management and Plan  [facility] MD Progress Note  06/15/2017 5:46 PM Howard [name]  MRN:  [id number] Subjective:  Follow-up for Friday the 21st. Patient was seen and chart reviewed. Patient says he is feeling significantly better today. He is no longer feeling sad. He still has auditory hallucinations but says they are less frequent. His affect is brighter. He is attending groups and interacting with others. He looks like he is taking care of himself fairly well. I'm glad that he is tolerating the clozapine and says he has very few to no side effects  Follow-up for Saturday the 22nd. Patient says his mood is feeling much better. Denies suicidal ideation. Feels that his hallucinations are getting better. Patient's side effects include some drooling although not excessively at this point. Feels a little bit tired. Constipation not a problem.  Follow-up for Sunday the 23rd. Patient says his mood is much better and he is not feeling depressed although later in the morning he talked with me about how it bothered him that other patients don't seem to want to interact much with him. Patient seems to been making an effort to socialize but feels like people are not is willing to engage with him as they are with some of the other patients. He denies suicidal thoughts. Hallucinations are still present but quieter. Side effects less prominent today.  Follow-up for Monday the 24th. Patient seen chart reviewed. Patient again today reports that he has no sense of depression. He still has hallucinations but they are gradually improving. He feels better today. He still feels sedated from his medicine but is able to get up and take care of himself and participate during the day. Denies any current suicidal thoughts.  Principal Problem: Schizophrenia, paranoid (HCC) Diagnosis:   Patient Active Problem List   Diagnosis Date  Noted  . Schizophrenia, paranoid (HCC) [F20.0] 06/09/2017  . PTSD (post-traumatic stress disorder) [F43.10] 06/08/2017  . Inflamed external hemorrhoid [K64.4] 05/13/2017  . End stage liver disease (HCC) [K72.90] 04/24/2017  . Esophageal varices without bleeding (HCC) [I85.00]   . Hematemesis [K92.0] 04/05/2017  . Upper GI bleed [K92.2]   . Iron deficiency anemia due to chronic blood loss [D50.0] 02/16/2017  . Nausea [R11.0] 01/18/2017  . Respiratory failure with hypoxia (HCC) [J96.91] 12/14/2016  . Seizures (HCC) [R56.9] 12/12/2016  . DNR (do not resuscitate) discussion [Z71.89] 08/11/2016  . Muscle weakness (generalized) [M62.81]   . GI bleed [K92.2] 08/07/2016  . OSA (obstructive sleep apnea) [G47.33] 04/29/2016  . Anemia [D64.9] 04/29/2016  . Thrombocytopenia (HCC) [D69.6] 04/29/2016  . Coagulopathy (HCC) [D68.9] 04/29/2016  . Obesity [E66.9] 04/29/2016  . Controlled type 2 diabetes mellitus without complication (HCC) [E11.9] 01/15/2016  . Essential (primary) hypertension [I10] 12/11/2015  . Pure hypercholesterolemia [E78.00] 12/11/2015  . Hepatic encephalopathy (HCC) [K72.90] 10/16/2015  . 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 Elevated transaminase level [R74.0] 07/04/2015  . Alcohol abuse [F10.10] 05/31/2015  . Paranoid schizophrenia (HCC) [F20.0] 03/20/2015   Total Time spent with patient: 30 minutes  Past Psychiatric History: Patient has a long history of schizophrenia and a more recent history of alcohol abuse which rapidly progressed to the point of cirrhosis. Past history of suicidality but has been quite a while since he was in the hospital. Has failed several antipsychotic medicines.  Past Medical History:  Past Medical History:  Diagnosis Date  . Alcoholic cirrhosis of  liver with ascites (HCC)   . Alcoholism (HCC)   . Anemia   . Atrial fibrillation (HCC)   . COPD (chronic obstructive pulmonary disease) (HCC)   . Depression   .  Diabetes mellitus, type II (HCC)   . Esophageal varices (HCC)   . Heart disease    irregular heart beat (palpitations) and heart murmur  . Hyperlipemia   . Hypertension   . Liver disease   . Multiple thyroid nodules   . Portal hypertensive gastropathy (HCC)   . Schizophrenia (HCC)   . Seizures (HCC)     Past Surgical History:  Procedure Laterality Date  . ESOPHAGOGASTRODUODENOSCOPY N/A 10/05/2015   Procedure: ESOPHAGOGASTRODUODENOSCOPY (EGD);  Surgeon: Elnita Maxwell, MD;  Location: [facility] ENDOSCOPY;  Service: Endoscopy;  Laterality: N/A;  . ESOPHAGOGASTRODUODENOSCOPY (EGD) WITH PROPOFOL N/A 08/03/2015   Procedure: ESOPHAGOGASTRODUODENOSCOPY (EGD) WITH PROPOFOL;  Surgeon: Elnita Maxwell, MD;  Location: [facility] ENDOSCOPY;  Service: Endoscopy;  Laterality: N/A;  . ESOPHAGOGASTRODUODENOSCOPY (EGD) WITH PROPOFOL N/A 08/31/2015   Procedure: ESOPHAGOGASTRODUODENOSCOPY (EGD) WITH PROPOFOL;  Surgeon: Elnita Maxwell, MD;  Location: [facility] ENDOSCOPY;  Service: Endoscopy;  Laterality: N/A;  . ESOPHAGOGASTRODUODENOSCOPY (EGD) WITH PROPOFOL N/A 04/04/2016   Procedure: ESOPHAGOGASTRODUODENOSCOPY (EGD) WITH PROPOFOL;  Surgeon: Scot Jun, MD;  Location: [facility] ENDOSCOPY;  Service: Endoscopy;  Laterality: N/A;  . ESOPHAGOGASTRODUODENOSCOPY (EGD) WITH PROPOFOL N/A 11/13/2016   Procedure: ESOPHAGOGASTRODUODENOSCOPY (EGD) WITH PROPOFOL;  Surgeon: Wyline Mood, MD;  Location: [facility] ENDOSCOPY;  Service: Endoscopy;  Laterality: N/A;  . ESOPHAGOGASTRODUODENOSCOPY (EGD) WITH PROPOFOL N/A 04/07/2017   Procedure: ESOPHAGOGASTRODUODENOSCOPY (EGD) WITH PROPOFOL;  Surgeon: Midge Minium, MD;  Location: [facility] ENDOSCOPY;  Service: Endoscopy;  Laterality: N/A;  . NO PAST SURGERIES     Family History:  Family History  Problem Relation Age of Onset  . Heart disease Mother   . Hypertension Mother   . Hyperlipidemia Mother   . Stroke Father   . Heart attack Father   . Hypertension Father   . Heart disease Father    . Alcohol abuse Father   . Heart disease Brother    Family Psychiatric  History: I believe he does have a relative who also has a major mental health problem and his brother seems to have some disability Social History:  History  Alcohol Use  . 0.0 oz/week    Comment: last drink 1 days ago (noted: 05/30/2017)     History  Drug Use No    Social History   Social History  . Marital status: Single    Spouse name: N/A  . Number of children: N/A  . Years of education: N/A   Occupational History  . disabled    Social History Main Topics  . Smoking status: Never Smoker  . Smokeless tobacco: Never Used  . Alcohol use 0.0 oz/week     Comment: last drink 1 days ago (noted: 05/30/2017)  . Drug use: No  . Sexual activity: No   Other Topics Concern  . None   Social History Narrative   ** Merged History Encounter **       Additional Social History:                         Sleep: Fair  Appetite:  Fair  Current Medications: Current Facility-Administered Medications  Medication Dose Route Frequency Provider Last Rate Last Dose  . acamprosate (CAMPRAL) tablet 666 mg  666 mg Oral TID WC [name], [name], MD   666 mg  at 06/15/17 1705  . albuterol (PROVENTIL HFA;VENTOLIN HFA) 108 (90 Base) MCG/ACT inhaler 2 puff  2 puff Inhalation Q4H PRN [name], [name], MD      . alum & mag hydroxide-simeth (MAALOX/MYLANTA) 200-200-20 MG/5ML suspension 30 mL  30 mL Oral Q4H PRN [name], [name], MD      . cloZAPine (CLOZARIL) tablet 125 mg  125 mg Oral QHS [name], [name], MD   125 mg at 06/14/17 2102  . ferrous sulfate tablet 325 mg  325 mg Oral Q breakfast [name], [name], MD   325 mg at 06/15/17 0814  . folic acid (FOLVITE) tablet 1 mg  1 mg Oral Daily [name], [name], MD   1 mg at 06/15/17 1610  . hydrOXYzine (ATARAX/VISTARIL) tablet 25 mg  25 mg Oral TID PRN [name], [name], MD   25 mg at 06/15/17 1445  . ibuprofen (ADVIL,MOTRIN) tablet 600 mg  600 mg Oral Q6H PRN Pucilowska, Jolanta  B, MD   600 mg at 06/14/17 1201  . lactulose (CHRONULAC) 10 GM/15ML solution 45 g  45 g Oral TID [name], [name], MD   45 g at 06/15/17 1707  . levETIRAcetam (KEPPRA) tablet 1,500 mg  1,500 mg Oral BID [name], [name], MD   1,500 mg at 06/15/17 1706  . losartan (COZAAR) tablet 25 mg  25 mg Oral Daily [name], [name], MD   25 mg at 06/15/17 [phone]  . magnesium hydroxide (MILK OF MAGNESIA) suspension 30 mL  30 mL Oral Daily PRN [name], [name], MD      . metFORMIN (GLUCOPHAGE) tablet 500 mg  500 mg Oral BID WC [name], [name], MD   500 mg at 06/15/17 1706  . multivitamin with minerals tablet 1 tablet  1 tablet Oral Daily [name], [name], MD   1 tablet at 06/15/17 0811  . nadolol (CORGARD) tablet 20 mg  20 mg Oral Daily [name], [name], MD   20 mg at 06/15/17 0811  . pantoprazole (PROTONIX) EC tablet 40 mg  40 mg Oral Daily [name], [name], MD   40 mg at 06/15/17 0814  . protein supplement (PREMIER PROTEIN) liquid  11 oz Oral BID BM [name], [name], MD   11 oz at 06/15/17 1417  . rifaximin (XIFAXAN) tablet 550 mg  550 mg Oral BID [name], [name], MD   550 mg at 06/15/17 1705  . sucralfate (CARAFATE) tablet 1 g  1 g Oral TID WC & HS [name], [name], MD   1 g at 06/15/17 1705  . thiamine (VITAMIN B-1) tablet 100 mg  100 mg Oral Daily [name], [name], MD   100 mg at 06/15/17 5409    Lab Results:  Results for orders placed or performed during the hospital encounter of 06/09/17 (from the past 48 hour(s))  CBC with Differential/Platelet     Status: Abnormal   Collection Time: 06/15/17  6:46 AM  Result Value Ref Range   WBC 5.0 3.8 - 10.6 K/uL   RBC 3.88 (L) 4.40 - 5.90 MIL/uL   Hemoglobin 11.9 (L) 13.0 - 18.0 g/dL   HCT 81.1 (L) 91.4 - 78.2 %   MCV 92.6 80.0 - 100.0 fL   MCH 30.7 26.0 - 34.0 pg   MCHC 33.2 32.0 - 36.0 g/dL   RDW 95.6 (H) 21.3 - 08.6 %   Platelets 94 (L) 150 - 440 K/uL   Neutrophils Relative % 61 %   Neutro Abs 3.1 1.4 -  6.5 K/uL   Lymphocytes Relative 24 %   Lymphs Abs 1.2 1.0 -  3.6 K/uL   Monocytes Relative 11 %   Monocytes Absolute 0.5 0.2 - 1.0 K/uL   Eosinophils Relative 3 %   Eosinophils Absolute 0.1 0 - 0.7 K/uL   Basophils Relative 1 %   Basophils Absolute 0.0 0 - 0.1 K/uL    Blood Alcohol level:  Lab Results  Component Value Date   ETH <5 06/08/2017   ETH <5 04/22/2017    Metabolic Disorder Labs: Lab Results  Component Value Date   HGBA1C 5.7 (H) 06/10/2017   MPG 116.89 06/10/2017   MPG 151 12/12/2016   No results found for: PROLACTIN Lab Results  Component Value Date   CHOL 199 06/10/2017   TRIG 97 06/10/2017   HDL 37 (L) 06/10/2017   CHOLHDL 5.4 06/10/2017   VLDL 19 06/10/2017   LDLCALC 143 (H) 06/10/2017   LDLCALC 121 (H) 01/19/2017    Physical Findings: AIMS: Facial and Oral Movements Muscles of Facial Expression: None, normal Lips and Perioral Area: Mild Jaw: None, normal Tongue: None, normal,Extremity Movements Upper (arms, wrists, hands, fingers): Moderate Lower (legs, knees, ankles, toes): None, normal, Trunk Movements Neck, shoulders, hips: None, normal, Overall Severity Severity of abnormal movements (highest score from questions above): Severe Incapacitation due to abnormal movements: None, normal Patient's awareness of abnormal movements (rate only patient's report): Aware, no distress, Dental Status Current problems with teeth and/or dentures?: Yes Does patient usually wear dentures?: No  CIWA:    COWS:     Musculoskeletal: Strength & Muscle Tone: within normal limits Gait & Station: shuffle Patient leans: N/A  Psychiatric Specialty Exam: Physical Exam  Nursing note and vitals reviewed. Constitutional: He appears well-developed and well-nourished.  HENT:  Head: Normocephalic and atraumatic.  Eyes: Pupils are equal, round, and reactive to light. Conjunctivae are normal.  Neck: Normal range of motion.  Cardiovascular: Regular rhythm and normal heart sounds.   Respiratory: Effort normal. No respiratory  distress.  GI: Soft.  Musculoskeletal: Normal range of motion.  Neurological: He is alert.  Skin: Skin is warm and dry.  Psychiatric: Judgment normal. His affect is not blunt. His speech is not delayed. He is slowed and actively hallucinating. Cognition and memory are impaired. He expresses no homicidal and no suicidal ideation.    Review of Systems  Constitutional: Negative.   HENT: Negative.   Eyes: Negative.   Respiratory: Negative.   Cardiovascular: Negative.   Gastrointestinal: Negative.   Musculoskeletal: Negative.   Skin: Negative.   Neurological: Negative.   Psychiatric/Behavioral: Positive for hallucinations. Negative for depression, memory loss, substance abuse and suicidal ideas. The patient is not nervous/anxious and does not have insomnia.     Blood pressure (!) 143/83, pulse 74, temperature 97.6 F (36.4 C), temperature source Oral, resp. rate 18, height  (1.905 m), weight (!) 149.7 kg (330 lb), SpO2 97 %.Body mass index is 41.25 kg/m.  General Appearance: Casual  Eye Contact:  Good  Speech:  Slow  Volume:  Decreased  Mood:  Dysphoric  Affect:  Appropriate and Congruent  Thought Process:  Goal Directed  Orientation:  Full (Time, Place, and Person)  Thought Content:  Hallucinations: Auditory  Suicidal Thoughts:  No  Homicidal Thoughts:  No  Memory:  Immediate;   Good Recent;   Fair Remote;   Fair  Judgement:  Fair  Insight:  Fair  Psychomotor Activity:  Decreased  Concentration:  Concentration: Fair  Recall:  Fair  Fund of Knowledge:  Fair  Language:  Fair  Akathisia:  No  Handed:  Right  AIMS (if indicated):     Assets:  Desire for Improvement Housing Resilience Social Support  ADL's:  Intact  Cognition:  Impaired,  Mild  Sleep:  Number of Hours: 7.3     Patient is now taking 125 mg of clozapine. Mood is much improved. Denies suicidal thoughts. Patient has a very supportive outpatient environment and intact outpatient treatment. I proposed to  him that we could discharge him tomorrow which she is agreeable to. Patient is also going to be encouraged to involve himself in outpatient substance abuse treatment again. Most likely discharge tomorrow.  Mordecai Rasmussen, MD 06/15/2017, 5:46 PM

## 2017-06-16 MED ORDER — PANTOPRAZOLE SODIUM 40 MG PO TBEC: 40.0000 mg | DELAYED_RELEASE_TABLET | Freq: Every day | ORAL | 3 refills | Status: AC

## 2017-06-16 MED ORDER — FERROUS SULFATE 325 (65 FE) MG PO TABS: 325.0000 mg | ORAL_TABLET | Freq: Every day | ORAL | 3 refills | Status: AC

## 2017-06-16 MED ORDER — RIFAXIMIN 550 MG PO TABS: 550.0000 mg | ORAL_TABLET | Freq: Two times a day (BID) | ORAL | 3 refills | Status: AC

## 2017-06-16 MED ORDER — LACTULOSE 10 GM/15ML PO SOLN: 45.0000 g | Freq: Three times a day (TID) | ORAL | 3 refills | Status: AC

## 2017-06-16 MED ORDER — ALBUTEROL SULFATE HFA 108 (90 BASE) MCG/ACT IN AERS: 2.0000 | INHALATION_SPRAY | RESPIRATORY_TRACT | 3 refills | Status: AC | PRN

## 2017-06-16 MED ORDER — NADOLOL 20 MG PO TABS: 20.0000 mg | ORAL_TABLET | Freq: Every day | ORAL | 3 refills | Status: AC

## 2017-06-16 MED ORDER — CLOZAPINE 50 MG PO TABS: 150.0000 mg | ORAL_TABLET | Freq: Every day | ORAL | 3 refills | Status: DC

## 2017-06-16 MED ORDER — LEVETIRACETAM 750 MG PO TABS: 1500.0000 mg | ORAL_TABLET | Freq: Two times a day (BID) | ORAL | 3 refills | Status: AC

## 2017-06-16 MED ORDER — ACAMPROSATE CALCIUM 333 MG PO TBEC: 666.0000 mg | DELAYED_RELEASE_TABLET | Freq: Three times a day (TID) | ORAL | 3 refills | Status: DC

## 2017-06-16 MED ORDER — METFORMIN HCL 500 MG PO TABS: 500.0000 mg | ORAL_TABLET | Freq: Two times a day (BID) | ORAL | 3 refills | Status: AC

## 2017-06-16 MED ORDER — LOSARTAN POTASSIUM 25 MG PO TABS: 25.0000 mg | ORAL_TABLET | Freq: Every day | ORAL | 3 refills | Status: AC

## 2017-06-16 MED ORDER — THIAMINE HCL 100 MG PO TABS: 100.0000 mg | ORAL_TABLET | Freq: Every day | ORAL | 3 refills | Status: AC

## 2017-06-16 MED ORDER — SUCRALFATE 1 G PO TABS: 1.0000 g | ORAL_TABLET | Freq: Three times a day (TID) | ORAL | 3 refills | Status: AC

## 2017-06-16 MED ORDER — HYDROXYZINE HCL 25 MG PO TABS: 25.0000 mg | ORAL_TABLET | Freq: Three times a day (TID) | ORAL | 3 refills | Status: AC | PRN

## 2017-06-16 MED ORDER — FOLIC ACID 1 MG PO TABS: 1.0000 mg | ORAL_TABLET | Freq: Every day | ORAL | 3 refills | Status: AC

## 2017-06-16 NOTE — Progress Notes (Signed)
  [facility] Adult Case Management Discharge Plan :  Will you be returning to the same living situation after discharge:  Yes,  with brother and mother. At discharge, do you have transportation home?: Yes,  mother Do you have the ability to pay for your medications: Yes,  medicaid  Release of information consent forms completed and in the chart;  Patient's signature needed at discharge.  Patient to Follow up at: Follow-up Information    [facility]. Go on 06/25/2017.   Specialty:  Behavioral Health Why:  Please attend your therapy appt with Nolon Rod on Thursday, 06/25/17, at 11:00am.  Pleae attend your medication appt with [name] on Tuesday, 06/30/17, at 1:00pm. Contact information: [address] [facility] [place] [address] [phone]       [facility] Go on 06/19/2017.   Why:  Please meet Unk Pinto, Peer Support Services at [facility] on Friday, 06/19/17, at 7:00am for an intake appt for Substance Abuse IOP program.  Please call Lorella Nimrod the night before to confirm. Contact information: [address] [place] [address] [phone]           Next level of care provider has access to [facility] Link:yes  Safety Planning and Suicide Prevention discussed: Yes,  with mother  Have you used any form of tobacco in the last 30 days? (Cigarettes, Smokeless Tobacco, Cigars, and/or Pipes): No  Has patient been referred to the Quitline?: N/A patient is not a smoker  Patient has been referred for addiction treatment: Yes  Lorri Frederick, LCSW 06/16/2017, 12:52 PM

## 2017-06-16 NOTE — Plan of Care (Signed)
Problem: Safety: Goal: Ability to remain free from injury will improve Outcome: Progressing Patient remains free from injury on the unit.  Problem: Education: Goal: Knowledge of [facility] Education information/materials will improve Outcome: Progressing Patient verbalizes understanding the unit rules provided to him.

## 2017-06-16 NOTE — [facility] Suicide Risk Assessment (Signed)
[facility] INPATIENT:  Family/Significant Other Suicide Prevention Education  Suicide Prevention Education:  Education Completed; Jabriel Vanduyne, mother, [phone], has been identified by the patient as the family member/significant other with whom the patient will be residing, and identified as the person(s) who will aid the patient in the event of a mental health crisis (suicidal ideations/suicide attempt).  With written consent from the patient, the family member/significant other has been provided the following suicide prevention education, prior to the and/or following the discharge of the patient.  The suicide prevention education provided includes the following:  Suicide risk factors  Suicide prevention and interventions  National Suicide Hotline telephone number  [facility] assessment telephone number  [facility] Emergency Assistance 911  [facility] and/or Residential Mobile Crisis Unit telephone number  Request made of family/significant other to:  Remove weapons (e.g., guns, rifles, knives), all items previously/currently identified as safety concern.  No guns in the home, per Alice.  Remove drugs/medications (over-the-counter, prescriptions, illicit drugs), all items previously/currently identified as a safety concern. CSW asked her to secure any medication in the home.  The family member/significant other verbalizes understanding of the suicide prevention education information provided.  The family member/significant other agrees to remove the items of safety concern listed above.  Lorri Frederick, LCSW 06/16/2017, 11:16 AM

## 2017-06-16 NOTE — [facility] Suicide Risk Assessment (Signed)
[facility] Admission Suicide Risk Assessment   Nursing information obtained from:   review of nursing notes conversation with on staff nursing in treatment team Demographic factors:   patient is unmarried. History of depression. Drinks. However, he lives with family and has positive social support Current Mental Status:   calm lucid. Mood stated as good. Denies depression. Denies suicidal thoughts. Not responding to internal stimuli Loss Factors:   medical problems which are getting worse Historical Factors:   history of psychosis and drinking Risk Reduction Factors:   positive family support and positive therapeutic support  Total Time spent with patient: 45 minutes Principal Problem: Schizophrenia, paranoid (HCC) Diagnosis:   Patient Active Problem List   Diagnosis Date Noted  . Schizophrenia, paranoid (HCC) [F20.0] 06/09/2017  . PTSD (post-traumatic stress disorder) [F43.10] 06/08/2017  . Inflamed external hemorrhoid [K64.4] 05/13/2017  . End stage liver disease (HCC) [K72.90] 04/24/2017  . Esophageal varices without bleeding (HCC) [I85.00]   . Hematemesis [K92.0] 04/05/2017  . Upper GI bleed [K92.2]   . Iron deficiency anemia due to chronic blood loss [D50.0] 02/16/2017  . Nausea [R11.0] 01/18/2017  . Respiratory failure with hypoxia (HCC) [J96.91] 12/14/2016  . Seizures (HCC) [R56.9] 12/12/2016  . DNR (do not resuscitate) discussion [Z71.89] 08/11/2016  . Muscle weakness (generalized) [M62.81]   . GI bleed [K92.2] 08/07/2016  . OSA (obstructive sleep apnea) [G47.33] 04/29/2016  . Anemia [D64.9] 04/29/2016  . Thrombocytopenia (HCC) [D69.6] 04/29/2016  . Coagulopathy (HCC) [D68.9] 04/29/2016  . Obesity [E66.9] 04/29/2016  . Controlled type 2 diabetes mellitus without complication (HCC) [E11.9] 01/15/2016  . Essential (primary) hypertension [I10] 12/11/2015  . Pure hypercholesterolemia [E78.00] 12/11/2015  . Hepatic encephalopathy (HCC) [K72.90] 10/16/2015  . 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 Elevated transaminase level [R74.0] 07/04/2015  . Alcohol abuse [F10.10] 05/31/2015  . Paranoid schizophrenia (HCC) [F20.0] 03/20/2015   Subjective Data: 36 year old man admitted to the hospital because of suicidal thought and worsening hallucinations. Patient has not engaged in any dangerous behavior in the hospital. He has been cooperative with treatment. He has engaged in groups both for substance abuse and psychotherapy and medication management. Decision was made to change his psychiatric medicines to primarily clozapine. Discontinued his other antipsychotics and antidepressants and started clozapine. Titrated up to current dose of 125 mg at night. Tolerating medicine well. Has some drooling but not during the day. Patient is now stating he has no feeling of "depression" and denies any suicidal thoughts. He is asking to engage in outpatient substance abuse treatment. He has positive support at home.  Continued Clinical Symptoms:  Alcohol Use Disorder Identification Test Final Score (AUDIT): 3 The "Alcohol Use Disorders Identification Test", Guidelines for Use in Primary Care, Second Edition.  World Science writer [facility]). Score between 0-7:  no or low risk or alcohol related problems. Score between 8-15:  moderate risk of alcohol related problems. Score between 16-19:  high risk of alcohol related problems. Score 20 or above:  warrants further diagnostic evaluation for alcohol dependence and treatment.   CLINICAL FACTORS:   Alcohol/Substance Abuse/Dependencies Schizophrenia:   Command hallucinatons   Musculoskeletal: Strength & Muscle Tone: decreased Gait & Station: normal Patient leans: N/A  Psychiatric Specialty Exam: Physical Exam  Nursing note and vitals reviewed. Constitutional: He appears well-developed and well-nourished.  HENT:  Head: Normocephalic and atraumatic.  Eyes: Pupils are equal, round, and reactive to light.  Conjunctivae are normal.  Neck: Normal range of motion.  Cardiovascular: Regular rhythm and  normal heart sounds.   Respiratory: Effort normal. No respiratory distress.  GI: Soft.  Musculoskeletal: Normal range of motion.  Neurological: He is alert.  Skin: Skin is warm and dry.  Psychiatric: He has a normal mood and affect. Judgment normal. His speech is delayed. He is slowed. Thought content is not paranoid. Cognition and memory are normal. He expresses no homicidal and no suicidal ideation.    Review of Systems  Constitutional: Negative.   HENT: Negative.   Eyes: Negative.   Respiratory: Negative.   Cardiovascular: Negative.   Gastrointestinal: Negative.   Musculoskeletal: Negative.   Skin: Negative.   Neurological: Negative.   Psychiatric/Behavioral: Positive for hallucinations. Negative for depression, memory loss, substance abuse and suicidal ideas. The patient is not nervous/anxious and does not have insomnia.     Blood pressure 102/67, pulse 92, temperature 98.4 F (36.9 C), temperature source Oral, resp. rate 18, height  (1.905 m), weight (!) 149.7 kg (330 lb), SpO2 97 %.Body mass index is 41.25 kg/m.  General Appearance: Casual  Eye Contact:  Good  Speech:  Slow  Volume:  Decreased  Mood:  Euthymic  Affect:  Constricted  Thought Process:  Goal Directed  Orientation:  Full (Time, Place, and Person)  Thought Content:  Logical  Suicidal Thoughts:  No  Homicidal Thoughts:  No  Memory:  Immediate;   Good Recent;   Fair Remote;   Fair  Judgement:  Good  Insight:  Good  Psychomotor Activity:  Decreased  Concentration:  Concentration: Fair  Recall:  [employer] of Knowledge:  Fair  Language:  Fair  Akathisia:  No  Handed:  Right  AIMS (if indicated):     Assets:  Desire for Improvement Housing Resilience Talents/Skills  ADL's:  Intact  Cognition:  Impaired,  Mild  Sleep:  Number of Hours: 8      COGNITIVE FEATURES THAT CONTRIBUTE TO RISK:  Loss of  executive function    SUICIDE RISK:   Mild:  Suicidal ideation of limited frequency, intensity, duration, and specificity.  There are no identifiable plans, no associated intent, mild dysphoria and related symptoms, good self-control (both objective and subjective assessment), few other risk factors, and identifiable protective factors, including available and accessible social support.  PLAN OF CARE: Patient will be discharged home. Continue current medicine. He stays with family and has a good relationship with them. I will see him as an outpatient and plan to see him within the next week. He is in contact with [facility] about possibly getting back into intensive outpatient substance abuse treatment. Patient agrees to plan. Case reviewed with treatment team.  I certify that inpatient services furnished can reasonably be expected to improve the patient's condition.   Mordecai Rasmussen, MD 06/16/2017, 1:33 PM

## 2017-06-16 NOTE — Progress Notes (Signed)
Patient affect is bright and very polite with staff. Patient thoughts are more organized with clear speech. Patient is cooperative with treatment plan, he appears less anxious and he is interacting with peers and staff appropriately.  Patient is alert and oriented to person, place and time. Skin is warm, dry and intact. No limitations to all four extremities noted. Patient  denies SI at this time.Patient was observed ambulating in hall during the shift with walker and steady gait notted. Attends meals and group with selective peer interaction noted. Milieu remains therapeutic. Patient will be monitored and physician notified of any acute changes.

## 2017-06-16 NOTE — Progress Notes (Signed)
Patient discharged home on the above date/time. Patient picked up by his mother, prescriptions electronically sent to patient's pharmacy. Patient verbally denies SI/AVH and pain. All personal items sent home with patient.

## 2017-06-19 NOTE — Discharge Summary (Signed)
Physician Discharge Summary Note  Patient:  Howard [name] is an 36 y.o., male MRN:  [id number] DOB:  1980/10/30 Patient phone:  [phone] (home)  Patient address:   [address] [place] [address],  Total Time spent with patient: 45 minutes  Date of Admission:  06/09/2017 Date of Discharge: 06/16/2017  Reason for Admission:  Patient was admitted to the hospital because of worsening suicidal ideation and hallucinations at home. Medication management outside the hospital had not kept him adequately stable.  Principal Problem: Schizophrenia, paranoid [facility]) Discharge Diagnoses: Patient Active Problem List   Diagnosis Date Noted  . Schizophrenia, paranoid (HCC) [F20.0] 06/09/2017  . PTSD (post-traumatic stress disorder) [F43.10] 06/08/2017  . Inflamed external hemorrhoid [K64.4] 05/13/2017  . End stage liver disease (HCC) [K72.90] 04/24/2017  . Esophageal varices without bleeding (HCC) [I85.00]   . Hematemesis [K92.0] 04/05/2017  . Upper GI bleed [K92.2]   . Iron deficiency anemia due to chronic blood loss [D50.0] 02/16/2017  . Nausea [R11.0] 01/18/2017  . Respiratory failure with hypoxia (HCC) [J96.91] 12/14/2016  . Seizures (HCC) [R56.9] 12/12/2016  . DNR (do not resuscitate) discussion [Z71.89] 08/11/2016  . Muscle weakness (generalized) [M62.81]   . GI bleed [K92.2] 08/07/2016  . OSA (obstructive sleep apnea) [G47.33] 04/29/2016  . Anemia [D64.9] 04/29/2016  . Thrombocytopenia (HCC) [D69.6] 04/29/2016  . Coagulopathy (HCC) [D68.9] 04/29/2016  . Obesity [E66.9] 04/29/2016  . Controlled type 2 diabetes mellitus without complication (HCC) [E11.9] 01/15/2016  . Essential (primary) hypertension [I10] 12/11/2015  . Pure hypercholesterolemia [E78.00] 12/11/2015  . Hepatic encephalopathy (HCC) [K72.90] 10/16/2015  . 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 Elevated transaminase level [R74.0] 07/04/2015  . Alcohol abuse [F10.10] 05/31/2015  .  Paranoid schizophrenia (HCC) [F20.0] 03/20/2015    Past Psychiatric History: Patient has a past history of schizophrenia which for years had largely been managed with outpatient medication. Also has a past history of alcohol abuse  Past Medical History:  Past Medical History:  Diagnosis Date  . Alcoholic cirrhosis of liver with ascites (HCC)   . Alcoholism (HCC)   . Anemia   . Atrial fibrillation (HCC)   . COPD (chronic obstructive pulmonary disease) (HCC)   . Depression   . Diabetes mellitus, type II (HCC)   . Esophageal varices (HCC)   . Heart disease    irregular heart beat (palpitations) and heart murmur  . Hyperlipemia   . Hypertension   . Liver disease   . Multiple thyroid nodules   . Portal hypertensive gastropathy (HCC)   . Schizophrenia (HCC)   . Seizures (HCC)     Past Surgical History:  Procedure Laterality Date  . ESOPHAGOGASTRODUODENOSCOPY N/A 10/05/2015   Procedure: ESOPHAGOGASTRODUODENOSCOPY (EGD);  Surgeon: Elnita Maxwell, MD;  Location: [facility] ENDOSCOPY;  Service: Endoscopy;  Laterality: N/A;  . ESOPHAGOGASTRODUODENOSCOPY (EGD) WITH PROPOFOL N/A 08/03/2015   Procedure: ESOPHAGOGASTRODUODENOSCOPY (EGD) WITH PROPOFOL;  Surgeon: Elnita Maxwell, MD;  Location: [facility] ENDOSCOPY;  Service: Endoscopy;  Laterality: N/A;  . ESOPHAGOGASTRODUODENOSCOPY (EGD) WITH PROPOFOL N/A 08/31/2015   Procedure: ESOPHAGOGASTRODUODENOSCOPY (EGD) WITH PROPOFOL;  Surgeon: Elnita Maxwell, MD;  Location: [facility] ENDOSCOPY;  Service: Endoscopy;  Laterality: N/A;  . ESOPHAGOGASTRODUODENOSCOPY (EGD) WITH PROPOFOL N/A 04/04/2016   Procedure: ESOPHAGOGASTRODUODENOSCOPY (EGD) WITH PROPOFOL;  Surgeon: Scot Jun, MD;  Location: [facility] ENDOSCOPY;  Service: Endoscopy;  Laterality: N/A;  . ESOPHAGOGASTRODUODENOSCOPY (EGD) WITH PROPOFOL N/A 11/13/2016   Procedure: ESOPHAGOGASTRODUODENOSCOPY (EGD) WITH PROPOFOL;  Surgeon: Wyline Mood, MD;  Location: [facility] ENDOSCOPY;  Service: Endoscopy;   Laterality: N/A;  . ESOPHAGOGASTRODUODENOSCOPY (EGD) WITH PROPOFOL N/A 04/07/2017   Procedure: ESOPHAGOGASTRODUODENOSCOPY (EGD) WITH PROPOFOL;  Surgeon: Midge Minium, MD;  Location: [facility] ENDOSCOPY;  Service: Endoscopy;  Laterality: N/A;  . NO PAST SURGERIES     Family History:  Family History  Problem Relation Age of Onset  . Heart disease Mother   . Hypertension Mother   . Hyperlipidemia Mother   . Stroke Father   . Heart attack Father   . Hypertension Father   . Heart disease Father   . Alcohol abuse Father   . Heart disease Brother    Family Psychiatric  History: Positive for mood disorder Social History:  History  Alcohol Use  . 0.0 oz/week    Comment: last drink 1 days ago (noted: 05/30/2017)     History  Drug Use No    Social History   Social History  . Marital status: Single    Spouse name: N/A  . Number of children: N/A  . Years of education: N/A   Occupational History  . disabled    Social History Main Topics  . Smoking status: Never Smoker  . Smokeless tobacco: Never Used  . Alcohol use 0.0 oz/week     Comment: last drink 1 days ago (noted: 05/30/2017)  . Drug use: No  . Sexual activity: No   Other Topics Concern  . None   Social History Narrative   ** Merged History Encounter **        Hospital Course:  Admitted to the psychiatry unit. Patient did not engage in any dangerous behavior in the hospital. He participated in appropriate individual and group therapy. After discussion about medication options we agreed to try clozapine. Antidepressants and other antipsychotics were discontinued and he was started on clozapine which was gradually tapered up. Patient tolerated the medicine with only minor problems with excess salivation. His mood improved during his time in the hospital. He reported not feeling any sense of depression. He denied any suicidal thoughts or wish to harm himself and denied any hallucinations. Patient participated as well in appropriate  substance abuse treatment.  Physical Findings: AIMS: Facial and Oral Movements Muscles of Facial Expression: None, normal Lips and Perioral Area: Mild Jaw: None, normal Tongue: None, normal,Extremity Movements Upper (arms, wrists, hands, fingers): Moderate Lower (legs, knees, ankles, toes): None, normal, Trunk Movements Neck, shoulders, hips: None, normal, Overall Severity Severity of abnormal movements (highest score from questions above): Severe Incapacitation due to abnormal movements: None, normal Patient's awareness of abnormal movements (rate only patient's report): Aware, no distress, Dental Status Current problems with teeth and/or dentures?: Yes Does patient usually wear dentures?: No  CIWA:    COWS:     Musculoskeletal: Strength & Muscle Tone: within normal limits Gait & Station: normal Patient leans: N/A  Psychiatric Specialty Exam: Physical Exam  Nursing note and vitals reviewed. Constitutional: He appears well-developed and well-nourished.  HENT:  Head: Normocephalic and atraumatic.  Eyes: Pupils are equal, round, and reactive to light. Conjunctivae are normal.  Neck: Normal range of motion.  Cardiovascular: Regular rhythm and normal heart sounds.   Respiratory: Effort normal. No respiratory distress.  GI: Soft. He exhibits distension.  Musculoskeletal: Normal range of motion.  Neurological: He is alert.  Skin: Skin is warm and dry.  Psychiatric: Judgment normal. His affect is blunt. His speech is delayed. He is slowed. Thought content is not paranoid. He expresses no homicidal and no suicidal ideation. He exhibits abnormal recent memory.  Review of Systems  Constitutional: Negative.   HENT: Negative.   Eyes: Negative.   Respiratory: Negative.   Cardiovascular: Negative.   Gastrointestinal: Negative.   Musculoskeletal: Negative.   Skin: Negative.   Neurological: Negative.   Psychiatric/Behavioral: Negative for depression, hallucinations, memory loss,  substance abuse and suicidal ideas. The patient is not nervous/anxious and does not have insomnia.     Blood pressure 102/67, pulse 92, temperature 98.4 F (36.9 C), temperature source Oral, resp. rate 18, height  (1.905 m), weight (!) 149.7 kg (330 lb), SpO2 97 %.Body mass index is 41.25 kg/m.  General Appearance: Casual  Eye Contact:  Good  Speech:  Slow  Volume:  Decreased  Mood:  Euthymic  Affect:  Constricted  Thought Process:  Goal Directed  Orientation:  Full (Time, Place, and Person)  Thought Content:  Logical  Suicidal Thoughts:  No  Homicidal Thoughts:  No  Memory:  Immediate;   Fair Recent;   Fair Remote;   Fair  Judgement:  Fair  Insight:  Fair  Psychomotor Activity:  Decreased  Concentration:  Concentration: Fair  Recall:  Fair  Fund of Knowledge:  Fair  Language:  Fair  Akathisia:  No  Handed:  Right  AIMS (if indicated):     Assets:  Desire for Improvement Resilience Social Support  ADL's:  Intact  Cognition:  Impaired,  Mild  Sleep:  Number of Hours: 8     Have you used any form of tobacco in the last 30 days? (Cigarettes, Smokeless Tobacco, Cigars, and/or Pipes): No  Has this patient used any form of tobacco in the last 30 days? (Cigarettes, Smokeless Tobacco, Cigars, and/or Pipes) Yes, No  Blood Alcohol level:  Lab Results  Component Value Date   ETH <5 06/08/2017   ETH <5 04/22/2017    Metabolic Disorder Labs:  Lab Results  Component Value Date   HGBA1C 5.7 (H) 06/10/2017   MPG 116.89 06/10/2017   MPG 151 12/12/2016   No results found for: PROLACTIN Lab Results  Component Value Date   CHOL 199 06/10/2017   TRIG 97 06/10/2017   HDL 37 (L) 06/10/2017   CHOLHDL 5.4 06/10/2017   VLDL 19 06/10/2017   LDLCALC 143 (H) 06/10/2017   LDLCALC 121 (H) 01/19/2017    See Psychiatric Specialty Exam and Suicide Risk Assessment completed by Attending Physician prior to discharge.  Discharge destination:  Home  Is patient on multiple  antipsychotic therapies at discharge:  No   Has Patient had three or more failed trials of antipsychotic monotherapy by history:  No  Recommended Plan for Multiple Antipsychotic Therapies: NA  Discharge Instructions    Diet - low sodium heart healthy    Complete by:  As directed    Increase activity slowly    Complete by:  As directed      Allergies as of 06/16/2017      Reactions   Tramadol Itching      Medication List    STOP taking these medications   citalopram 20 MG tablet Commonly known as:  CELEXA   feeding supplement (ENSURE ENLIVE) Liqd   furosemide 20 MG tablet Commonly known as:  LASIX   hydrocortisone 2.5 % rectal cream Commonly known as:  ANUSOL-HC   lurasidone 40 MG Tabs tablet Commonly known as:  LATUDA   NON FORMULARY   omeprazole 40 MG capsule Commonly known as:  PRILOSEC   perphenazine 8 MG tablet Commonly known as:  TRILAFON   spironolactone 50  MG tablet Commonly known as:  ALDACTONE   temazepam 30 MG capsule Commonly known as:  RESTORIL   traZODone 100 MG tablet Commonly known as:  DESYREL     TAKE these medications     Indication  acamprosate 333 MG tablet Commonly known as:  CAMPRAL Take 2 tablets (666 mg total) by mouth 3 (three) times daily with meals.  Indication:  Excessive Use of Alcohol   albuterol 108 (90 Base) MCG/ACT inhaler Commonly known as:  PROVENTIL HFA;VENTOLIN HFA Inhale 2 puffs into the lungs every 4 (four) hours as needed for wheezing or shortness of breath. What changed:  when to take this  Indication:  Asthma   clozapine 50 MG tablet Commonly known as:  CLOZARIL Take 3 tablets (150 mg total) by mouth at bedtime.  Indication:  Schizophrenia that does Not Respond to Usual Drug Therapy   ferrous sulfate 325 (65 FE) MG tablet Take 1 tablet (325 mg total) by mouth daily with breakfast. What changed:  how much to take  when to take this  additional instructions  Indication:  Anemia From Inadequate Iron  in the Body   folic acid 1 MG tablet Commonly known as:  FOLVITE Take 1 tablet (1 mg total) by mouth daily. What changed:  See the new instructions.  Indication:  Anemia From Inadequate Folic Acid   hydrOXYzine 25 MG tablet Commonly known as:  ATARAX/VISTARIL Take 1 tablet (25 mg total) by mouth 3 (three) times daily as needed for anxiety.  Indication:  Feeling Anxious   lactulose 10 GM/15ML solution Commonly known as:  CHRONULAC Take 67.5 mLs (45 g total) by mouth 3 (three) times daily. What changed:  how much to take  when to take this  Indication:  Impaired Brain Function due to Liver Disease   levETIRAcetam 750 MG tablet Commonly known as:  KEPPRA Take 2 tablets (1,500 mg total) by mouth 2 (two) times daily.  Indication:  Muscular Spasm or Twitch occurring with Seizures   losartan 25 MG tablet Commonly known as:  COZAAR Take 1 tablet (25 mg total) by mouth daily. What changed:  See the new instructions.  Indication:  High Blood Pressure Disorder   metFORMIN 500 MG tablet Commonly known as:  GLUCOPHAGE Take 1 tablet (500 mg total) by mouth 2 (two) times daily with a meal. What changed:  See the new instructions.  Indication:  Antipsychotic Therapy-Induced Weight Gain   nadolol 20 MG tablet Commonly known as:  CORGARD Take 1 tablet (20 mg total) by mouth daily. What changed:  See the new instructions.  Indication:  High Blood Pressure Disorder   pantoprazole 40 MG tablet Commonly known as:  PROTONIX Take 1 tablet (40 mg total) by mouth daily.  Indication:  Esophagus Inflammation with Erosion, Gastroesophageal Reflux Disease   rifaximin 550 MG Tabs tablet Commonly known as:  XIFAXAN Take 1 tablet (550 mg total) by mouth 2 (two) times daily. What changed:  See the new instructions.  Indication:  Impaired Brain Function due to Liver Disease   sucralfate 1 g tablet Commonly known as:  CARAFATE Take 1 tablet (1 g total) by mouth 4 (four) times daily -  with  meals and at bedtime. What changed:  when to take this  Indication:  Ulcer of the Duodenum   thiamine 100 MG tablet Take 1 tablet (100 mg total) by mouth daily.  Indication:  Chronic Diarrhea, Deficiency in Thiamine or Vitamin B1      Follow-up Information  [facility]. Go on 06/25/2017.   Specialty:  Behavioral Health Why:  Please attend your therapy appt with Nolon Rod on Thursday, 06/25/17, at 11:00am.  Pleae attend your medication appt with [name] on Tuesday, 06/30/17, at 1:00pm. Contact information: [address] [facility] [place] [address] [phone]       [facility] Go on 06/19/2017.   Why:  Please meet Unk Pinto, Peer Support Services at [facility] on Friday, 06/19/17, at 7:00am for an intake appt for Substance Abuse IOP program.  Please call Lorella Nimrod the night before to confirm. Contact information: [address] [place] [address] [phone]           Follow-up recommendations:  Activity:  Activity as tolerated Diet:  Heart healthy diet Other:  Continue with current medication. He is planning to go to [facility] again to engage in substance abuse treatment. Follow-up with me in about a week.  Comments:  Spoke with community liaison from [employer]. Plan is tentatively for the patient to go back to intensive outpatient for his alcohol abuse  Signed: Mordecai Rasmussen, MD 06/19/2017, 4:58 PM

## 2017-06-21 NOTE — Progress Notes (Signed)
[facility]  [phone] [phone]  ID: Howard [name] OB: 1981/07/10  MR#: [id number]  QIO#:[id number]  Patient Care Team: [name], NP as PCP - General (Nurse Practitioner)  CHIEF COMPLAINT: Iron deficiency anemia  INTERVAL HISTORY: Patient returns to clinic today for repeat laboratory work and further evaluation. Patient is a poor historian, but denies any complaints today. He has no neurologic complaints. He denies any recent fevers or illnesses. He has a good appetite and his weight has remained stable. He has no chest pain or shortness of breath. He denies any recent bleeding. He has no nausea, vomiting, constipation, or diarrhea. He denies any melena or hematochezia. He has no urinary complaints. Patient offers no specific complaints today.  REVIEW OF SYSTEMS:   Review of Systems  Constitutional: Negative.  Negative for fever, malaise/fatigue and weight loss.  Respiratory: Positive for shortness of breath. Negative for cough.   Cardiovascular: Negative.  Negative for chest pain and leg swelling.  Gastrointestinal: Negative.  Negative for abdominal pain, blood in stool and melena.  Genitourinary: Negative.   Musculoskeletal: Negative.   Skin: Negative.  Negative for rash.  Neurological: Negative.  Negative for sensory change and weakness.  Psychiatric/Behavioral: Positive for substance abuse. The patient is not nervous/anxious.     As per HPI. Otherwise, a complete review of systems is negative.  PAST MEDICAL HISTORY: Past Medical History:  Diagnosis Date  . Alcoholic cirrhosis of liver with ascites (HCC)   . Alcoholism (HCC)   . Anemia   . Atrial fibrillation (HCC)   . COPD (chronic obstructive pulmonary disease) (HCC)   . Depression   . Diabetes mellitus, type II (HCC)   . Esophageal varices (HCC)   . Heart disease    irregular heart beat (palpitations) and heart murmur  . Hyperlipemia   . Hypertension   .  Liver disease   . Multiple thyroid nodules   . Portal hypertensive gastropathy (HCC)   . Schizophrenia (HCC)   . Seizures (HCC)     PAST SURGICAL HISTORY: Past Surgical History:  Procedure Laterality Date  . ESOPHAGOGASTRODUODENOSCOPY N/A 10/05/2015   Procedure: ESOPHAGOGASTRODUODENOSCOPY (EGD);  Surgeon: Elnita Maxwell, MD;  Location: [facility] ENDOSCOPY;  Service: Endoscopy;  Laterality: N/A;  . ESOPHAGOGASTRODUODENOSCOPY (EGD) WITH PROPOFOL N/A 08/03/2015   Procedure: ESOPHAGOGASTRODUODENOSCOPY (EGD) WITH PROPOFOL;  Surgeon: Elnita Maxwell, MD;  Location: [facility] ENDOSCOPY;  Service: Endoscopy;  Laterality: N/A;  . ESOPHAGOGASTRODUODENOSCOPY (EGD) WITH PROPOFOL N/A 08/31/2015   Procedure: ESOPHAGOGASTRODUODENOSCOPY (EGD) WITH PROPOFOL;  Surgeon: Elnita Maxwell, MD;  Location: [facility] ENDOSCOPY;  Service: Endoscopy;  Laterality: N/A;  . ESOPHAGOGASTRODUODENOSCOPY (EGD) WITH PROPOFOL N/A 04/04/2016   Procedure: ESOPHAGOGASTRODUODENOSCOPY (EGD) WITH PROPOFOL;  Surgeon: Scot Jun, MD;  Location: [facility] ENDOSCOPY;  Service: Endoscopy;  Laterality: N/A;  . ESOPHAGOGASTRODUODENOSCOPY (EGD) WITH PROPOFOL N/A 11/13/2016   Procedure: ESOPHAGOGASTRODUODENOSCOPY (EGD) WITH PROPOFOL;  Surgeon: Wyline Mood, MD;  Location: [facility] ENDOSCOPY;  Service: Endoscopy;  Laterality: N/A;  . ESOPHAGOGASTRODUODENOSCOPY (EGD) WITH PROPOFOL N/A 04/07/2017   Procedure: ESOPHAGOGASTRODUODENOSCOPY (EGD) WITH PROPOFOL;  Surgeon: Midge Minium, MD;  Location: [facility] ENDOSCOPY;  Service: Endoscopy;  Laterality: N/A;  . NO PAST SURGERIES      FAMILY HISTORY: Family History  Problem Relation Age of Onset  . Heart disease Mother   . Hypertension Mother   . Hyperlipidemia Mother   . Stroke Father   . Heart attack Father   . Hypertension Father   . Heart disease Father   . Alcohol  abuse Father   . Heart disease Brother     ADVANCED DIRECTIVES (Y/N):  N  HEALTH MAINTENANCE: Social History  Substance Use Topics    . Smoking status: Never Smoker  . Smokeless tobacco: Never Used  . Alcohol use 0.0 oz/week     Comment: last drink 1 days ago (noted: 05/30/2017)     Colonoscopy:  PAP:  Bone density:  Lipid panel:  Allergies  Allergen Reactions  . Tramadol Itching    Current Outpatient Prescriptions  Medication Sig Dispense Refill  . acamprosate (CAMPRAL) 333 MG tablet Take 2 tablets (666 mg total) by mouth 3 (three) times daily with meals. 180 tablet 3  . albuterol (PROVENTIL HFA;VENTOLIN HFA) 108 (90 Base) MCG/ACT inhaler Inhale 2 puffs into the lungs every 4 (four) hours as needed for wheezing or shortness of breath. 1 Inhaler 3  . cloZAPine (CLOZARIL) 50 MG tablet Take 3 tablets (150 mg total) by mouth at bedtime. 90 tablet 3  . ferrous sulfate 325 (65 FE) MG tablet Take 1 tablet (325 mg total) by mouth daily with breakfast. 30 tablet 3  . folic acid (FOLVITE) 1 MG tablet Take 1 tablet (1 mg total) by mouth daily. 30 tablet 3  . hydrOXYzine (ATARAX/VISTARIL) 25 MG tablet Take 1 tablet (25 mg total) by mouth 3 (three) times daily as needed for anxiety. 30 tablet 3  . lactulose (CHRONULAC) 10 GM/15ML solution Take 67.5 mLs (45 g total) by mouth 3 (three) times daily. 240 mL 3  . levETIRAcetam (KEPPRA) 750 MG tablet Take 2 tablets (1,500 mg total) by mouth 2 (two) times daily. 120 tablet 3  . losartan (COZAAR) 25 MG tablet Take 1 tablet (25 mg total) by mouth daily. 30 tablet 3  . metFORMIN (GLUCOPHAGE) 500 MG tablet Take 1 tablet (500 mg total) by mouth 2 (two) times daily with a meal. 60 tablet 3  . nadolol (CORGARD) 20 MG tablet Take 1 tablet (20 mg total) by mouth daily. 30 tablet 3  . pantoprazole (PROTONIX) 40 MG tablet Take 1 tablet (40 mg total) by mouth daily. 30 tablet 3  . rifaximin (XIFAXAN) 550 MG TABS tablet Take 1 tablet (550 mg total) by mouth 2 (two) times daily. 60 tablet 3  . sucralfate (CARAFATE) 1 g tablet Take 1 tablet (1 g total) by mouth 4 (four) times daily -  with meals and  at bedtime. 120 tablet 3  . thiamine 100 MG tablet Take 1 tablet (100 mg total) by mouth daily. 30 tablet 3   No current facility-administered medications for this visit.     OBJECTIVE: Vitals:   06/23/17 1417  BP: 133/84  Pulse: 84  Resp: 20  Temp: 97.7 F (36.5 C)  SpO2: 97%     Body mass index is 41.8 kg/m.    ECOG FS:1 - Symptomatic but completely ambulatory  General: Well-developed, well-nourished, no acute distress. Eyes: Pink conjunctiva, anicteric sclera. Lungs: Clear to auscultation bilaterally. Heart: Regular rate and rhythm. No rubs, murmurs, or gallops. Abdomen: Soft, nontender, nondistended. No organomegaly noted, normoactive bowel sounds. Musculoskeletal: No edema, cyanosis, or clubbing. Neuro: Alert, answering all questions appropriately. Cranial nerves grossly intact. Skin: No rashes or petechiae noted. Psych: Normal affect.  LAB RESULTS:  Lab Results  Component Value Date   NA 138 06/08/2017   K 4.5 06/08/2017   CL 105 06/08/2017   CO2 25 06/08/2017   GLUCOSE 94 06/08/2017   BUN 10 06/08/2017   CREATININE 0.97 06/08/2017   CALCIUM  9.1 06/08/2017   PROT 8.1 06/08/2017   ALBUMIN 3.9 06/08/2017   AST 56 (H) 06/08/2017   ALT 49 06/08/2017   ALKPHOS 100 06/08/2017   BILITOT 1.5 (H) 06/08/2017   GFRNONAA >60 06/08/2017   GFRAA >60 06/08/2017    Lab Results  Component Value Date   WBC 5.7 06/23/2017   NEUTROABS 3.6 06/23/2017   HGB 11.2 (L) 06/23/2017   HCT 32.9 (L) 06/23/2017   MCV 90.4 06/23/2017   PLT 134 (L) 06/23/2017   Lab Results  Component Value Date   IRON 41 (L) 03/23/2017   TIBC 424 03/23/2017   IRONPCTSAT 10 (L) 03/23/2017   Lab Results  Component Value Date   FERRITIN 37 03/23/2017     STUDIES: No results found.  ASSESSMENT: Iron deficiency anemia  PLAN:    1. Iron deficiency anemia: Patient's hemoglobin mildly decreased, but essentially unchanged. Previously he was noted to have mildly decreased iron stores. The  remainder of his anemia workup is either negative or within normal limits. He does not require IV iron at this time, but given his history of variceal bleeds he may need one in the future. Have recommended patient initiate oral iron supplementation. No intervention is needed. No follow-up is necessary. Please refer patient back if there are any questions or concerns. 2. Thrombocytopenia: Mild. Patient noted to have an enlarged spleen of 18 cm which is likely contributing. 3. Cirrhosis: AFP drawn on March 23, 2017 was within normal limits at 2.0. CT scan on April 22, 2017 reviewed independently revealed changes consistent with cirrhosis with no focal intrahepatic mass.  Approximately 30 minutes was spent in discussion of which greater than 50% was consultation.  Patient expressed understanding and was in agreement with this plan. He also understands that He can call clinic at any time with any questions, concerns, or complaints.    Jeralyn Ruths, MD   06/23/2017 2:41 PM

## 2017-06-22 ENCOUNTER — Other Ambulatory Visit: Payer: Self-pay | Admitting: Oncology

## 2017-06-22 DIAGNOSIS — D500 Iron deficiency anemia secondary to blood loss (chronic): Secondary | ICD-10-CM

## 2017-06-23 ENCOUNTER — Inpatient Hospital Stay: Payer: Medicare Other | Attending: Oncology

## 2017-06-23 ENCOUNTER — Inpatient Hospital Stay (HOSPITAL_BASED_OUTPATIENT_CLINIC_OR_DEPARTMENT_OTHER): Payer: Medicare Other | Admitting: Oncology

## 2017-06-23 VITALS — BP 133/84 | HR 84 | Temp 97.7°F | Resp 20 | Wt 334.4 lb

## 2017-06-23 DIAGNOSIS — I10 Essential (primary) hypertension: Secondary | ICD-10-CM | POA: Insufficient documentation

## 2017-06-23 DIAGNOSIS — E785 Hyperlipidemia, unspecified: Secondary | ICD-10-CM | POA: Diagnosis not present

## 2017-06-23 DIAGNOSIS — Z79899 Other long term (current) drug therapy: Secondary | ICD-10-CM

## 2017-06-23 DIAGNOSIS — D500 Iron deficiency anemia secondary to blood loss (chronic): Secondary | ICD-10-CM | POA: Insufficient documentation

## 2017-06-23 DIAGNOSIS — Z8719 Personal history of other diseases of the digestive system: Secondary | ICD-10-CM

## 2017-06-23 DIAGNOSIS — F209 Schizophrenia, unspecified: Secondary | ICD-10-CM | POA: Insufficient documentation

## 2017-06-23 DIAGNOSIS — F1020 Alcohol dependence, uncomplicated: Secondary | ICD-10-CM | POA: Diagnosis not present

## 2017-06-23 DIAGNOSIS — K7030 Alcoholic cirrhosis of liver without ascites: Secondary | ICD-10-CM | POA: Diagnosis not present

## 2017-06-23 DIAGNOSIS — Z7984 Long term (current) use of oral hypoglycemic drugs: Secondary | ICD-10-CM

## 2017-06-23 DIAGNOSIS — D696 Thrombocytopenia, unspecified: Secondary | ICD-10-CM

## 2017-06-23 DIAGNOSIS — J449 Chronic obstructive pulmonary disease, unspecified: Secondary | ICD-10-CM | POA: Insufficient documentation

## 2017-06-23 DIAGNOSIS — R161 Splenomegaly, not elsewhere classified: Secondary | ICD-10-CM | POA: Diagnosis not present

## 2017-06-23 DIAGNOSIS — E119 Type 2 diabetes mellitus without complications: Secondary | ICD-10-CM

## 2017-06-23 DIAGNOSIS — I4891 Unspecified atrial fibrillation: Secondary | ICD-10-CM

## 2017-06-23 LAB — CBC WITH DIFFERENTIAL/PLATELET
BASOS ABS: 0.1 10*3/uL (ref 0–0.1)
BASOS PCT: 1 %
EOS PCT: 3 %
Eosinophils Absolute: 0.2 10*3/uL (ref 0–0.7)
HEMATOCRIT: 32.9 % — AB (ref 40.0–52.0)
Hemoglobin: 11.2 g/dL — ABNORMAL LOW (ref 13.0–18.0)
LYMPHS PCT: 21 %
Lymphs Abs: 1.2 10*3/uL (ref 1.0–3.6)
MCH: 30.7 pg (ref 26.0–34.0)
MCHC: 33.9 g/dL (ref 32.0–36.0)
MCV: 90.4 fL (ref 80.0–100.0)
MONO ABS: 0.6 10*3/uL (ref 0.2–1.0)
MONOS PCT: 11 %
NEUTROS ABS: 3.6 10*3/uL (ref 1.4–6.5)
Neutrophils Relative %: 64 %
PLATELETS: 134 10*3/uL — AB (ref 150–440)
RBC: 3.64 MIL/uL — ABNORMAL LOW (ref 4.40–5.90)
RDW: 15.1 % — AB (ref 11.5–14.5)
WBC: 5.7 10*3/uL (ref 3.8–10.6)

## 2017-06-23 LAB — IRON AND TIBC
Iron: 43 ug/dL — ABNORMAL LOW (ref 45–182)
Saturation Ratios: 12 % — ABNORMAL LOW (ref 17.9–39.5)
TIBC: 352 ug/dL (ref 250–450)
UIBC: 309 ug/dL

## 2017-06-23 LAB — FERRITIN: Ferritin: 54 ng/mL (ref 24–336)

## 2017-06-23 NOTE — Progress Notes (Signed)
Patient denies any concerns today.  

## 2017-06-25 ENCOUNTER — Ambulatory Visit: Admitting: Licensed Clinical Social Worker

## 2017-06-26 ENCOUNTER — Ambulatory Visit (INDEPENDENT_AMBULATORY_CARE_PROVIDER_SITE_OTHER): Payer: Medicare Other | Admitting: Nurse Practitioner

## 2017-06-26 ENCOUNTER — Encounter: Payer: Self-pay | Admitting: Nurse Practitioner

## 2017-06-26 VITALS — BP 116/64 | HR 83 | Temp 98.6°F | Ht 75.0 in | Wt 337.5 lb

## 2017-06-26 DIAGNOSIS — R569 Unspecified convulsions: Secondary | ICD-10-CM

## 2017-06-26 DIAGNOSIS — K7290 Hepatic failure, unspecified without coma: Secondary | ICD-10-CM

## 2017-06-26 DIAGNOSIS — R82998 Other abnormal findings in urine: Secondary | ICD-10-CM

## 2017-06-26 DIAGNOSIS — M6281 Muscle weakness (generalized): Secondary | ICD-10-CM

## 2017-06-26 DIAGNOSIS — F200 Paranoid schizophrenia: Secondary | ICD-10-CM

## 2017-06-26 DIAGNOSIS — K7210 Chronic hepatic failure without coma: Secondary | ICD-10-CM

## 2017-06-26 LAB — POCT URINALYSIS DIPSTICK
Glucose, UA: NEGATIVE
Ketones, UA: NEGATIVE
Leukocytes, UA: NEGATIVE
Nitrite, UA: NEGATIVE
Protein, UA: NEGATIVE
Spec Grav, UA: 1.025 (ref 1.010–1.025)
Urobilinogen, UA: 0.2 E.U./dL
pH, UA: 5 (ref 5.0–8.0)

## 2017-06-26 NOTE — Assessment & Plan Note (Signed)
Possible improvement of seziures with last seizure approximately one month ago which is reported as mild.  Onset 05/2016 and was being seen by neurology at [facility].  Last visit in January 2018 and pt states he doesn't know who prescribes his seizure medicine.  Plan: 1. Continue current medications. 2. Continue follow up with neurology.  Will send this office note to Dr. Malvin Johns and request Dr. Daisy Blossom office contact pt for followup. 3. Follow up w/ me prn.

## 2017-06-26 NOTE — Assessment & Plan Note (Signed)
Improved symptoms in clinic today, however worsening jaundice noted.  Pt has very minimal liver flap today.  Pt seems to have improved symptom control w/ hospice care through [facility]. Significant reduction in ED utilization rates as well with only one ED visit for fall/dizziness and one for schizophrenia exacerbation that resulted in 8 day inpatient hospitalization.   Plan: 1. Encouraged 3 doses lactulose daily.  Should put his lactulose cup next to his pills so he can remember to take it. 2. Continue hospice care.  If continued improvement, may not continue to qualify.  Would need to continue home care if possible. 3. Follow up 4 weeks or sooner if needed. Pt verbalizes understanding.  Will continue to collaborate closely w/ [facility] hospice.

## 2017-06-26 NOTE — Assessment & Plan Note (Signed)
Currently stable after inpatient hospitalization on new regimen of clozapine only.  Managed by [name] and pt continues to keep ongoing communication with his clinic.    Plan: 1. Continue current medications. 2. Will send new medication list to [name] w/ prn meds for hospice that were not on med list during inpatient visit. 3. Follow up as needed w/ me and continue regular care w/ psychiatry.

## 2017-06-26 NOTE — Patient Instructions (Addendum)
Howard [name], Thank you for coming in to clinic today.  1. Call us for any questions you may have.  2. No med changes today. BUT put your lactulose cup w/ your medicine so you can remember to take it.  3. Ask your hospice nurse which as needed medicine is for sleep, pain, and anxiety.  You may have mixed up your sleep and anxiety as needed medicine. - You may only need 1/2 of your anxiety and sleep pills, so don't hesitate to cut them in half.  You can always talk to hospice about these medicines.    Please schedule a follow-up appointment with [name], AGNP. Return in about 4 weeks (around 07/24/2017) for liver failure.  If you have any other questions or concerns, please feel free to call the clinic or send a message through MyChart. You may also schedule an earlier appointment if necessary.  You will receive a survey after today's visit either digitally by e-mail or paper by [employer]. Your experiences and feedback matter to [place].  Please respond so we know how we are doing as we provide care for you.   [name], DNP, AGNP-BC Adult Gerontology Nurse Practitioner [facility], [facility]

## 2017-06-26 NOTE — Assessment & Plan Note (Signed)
Improving over last appointments in clinic when pt needed use of a wheelchair.  Now able to ambulate independently w/ walker.  Still has balance difficulty, but more mobile.  Likely 2/2 liver disease.  Pt does admit to frequent falls at home, but exhibits poor utilization of walker when making transitions from standing to seated.  Plan: 1. Continue activity as tolerated.  Encourage daily ADLs. 2. Continue using 2-wheel walker. 3. Follow up as needed.

## 2017-06-26 NOTE — Progress Notes (Signed)
Thank you. Yes I think they are still safety use. He has been using these in the hospital. Gaje is a delicate case with a lot of symptoms. Trying to balance safety versus getting him some relief from his symptoms. Thank you for the message.

## 2017-06-26 NOTE — Progress Notes (Signed)
Subjective:    Patient ID: Howard [name], male    DOB: 06-Sep-1981, 36 y.o.   MRN: [id number]  Howard [name] is a 36 y.o. male presenting on 06/26/2017 for Hospitalization Follow-up (pt was in the behavioral ward x 8 days ) and Hepatic Disease (Regular follow up)   HPI Behavioral Health hospital follow-up brief review: Pt notes he has had significant improvement in his Schizophrenia symptoms since being home from hospital.  Currently denies all SI.  Does state he is significantly fatigued since being home.  Has admitted to not finishing his intensive outpatient therapy at [facility].  He does not wish to return.  Fatigue Possible association to new psychiatric medication, but is also taking prn anxiety and sleep meds from hospice provider, but cannot tell me what the names are.  Personal review of hospice update received to clinic today shows prn medications compazine, morphine, lorazepam, trazodone, and senna.  Liver Failure Is taking lactulose more regularly, but still misses doses occasionally.  He states he has taken one dose today, but doesn't think he took any yesterday.  States it is hard to remember because it is in a different location from his pillpacks.  States that there are no stickers on pillpacks to remind him as previously discussed w/ his pharmacy. Current symptoms: - Is noticing worsening muscle weakness, general fatigue, increased ascites.  He has had less liver flap. - Denies any recent nausea and vomiting.  Is no longer taking acamprosate and has helped resolve this problem. Pt states he thinks these pills are back in his new pill packs and is also on his current medication list, so will discontinue these again. - Hospice is working very well.  Trusts them. - Uses alcohol 24 oz can every 2-3 days, but you don't drink every day.  Drinks about 2 cans per week.  States he was encouraged by his hospice provider to have only small amount (<12 oz) almost every day to avoid binge  episodes.  Pt states he is much more successful with this manner of alcohol consumption and has not consumed a full 24 oz beer since starting this.  Seizure disorder Has had prescriptions of Keppra written by neurology in past and last office visit was in January 2018 with Dr. Malvin Johns.  Should re-establish regular routine w/ neurology.  Had a seizure last about 1 month ago.  Notes it was only a short seizure and feels like he is currently well controlled.  Brown colored urine States " I have been peeing blood."  Uses urinal at home to assist w/ decreased mobility.  Notes one of them had dark or reddish brown urine.  He also admits that he may have been a little dehydrated.   Social History  Substance Use Topics  . Smoking status: Never Smoker  . Smokeless tobacco: Never Used  . Alcohol use 0.0 oz/week     Comment: last drink 1 days ago (noted: 05/30/2017)    Review of Systems Per HPI unless specifically indicated above     Objective:    BP 116/64 (BP Location: Right Arm, Patient Position: Sitting, Cuff Size: Large)   Pulse 83   Temp 98.6 F (37 C) (Oral)   Ht  (1.905 m)   Wt (!) 337 lb 8 oz (153.1 kg)   BMI 42.18 kg/m   Wt Readings from Last 3 Encounters:  06/26/17 (!) 337 lb 8 oz (153.1 kg)  06/23/17 (!) 334 lb 6.4 oz (151.7 kg)  06/08/17 Marland Kitchen)  330 lb (149.7 kg)    Physical Exam  Constitutional: He is oriented to person, place, and time. Vital signs are normal. He appears well-developed. He appears lethargic. He appears ill (chronically).  HENT:  Right Ear: Hearing, tympanic membrane and external ear normal.  Left Ear: Hearing, tympanic membrane and external ear normal.  Excessive salivation noted w/ occasional dripping from lips after conversation  Cardiovascular: Normal rate, regular rhythm, normal heart sounds and intact distal pulses.   Pulmonary/Chest: Effort normal and breath sounds normal. No respiratory distress.  Abdominal: He exhibits distension, fluid wave and  ascites. He exhibits no mass. Bowel sounds are increased. There is generalized tenderness. No hernia.  hepatomegaly w/ scratch test, no splenomegaly, increased ascites present, mild fluid wave present. Minimal generalizde tenderness noted throughout abdomen  Musculoskeletal:  Muscle strength 3/5 lower extremities, 4/5 upper extremities.  Neurological: He is oriented to person, place, and time. He appears lethargic. He displays tremor. Coordination and gait abnormal.  Very minimal liver flap present which is significantly improved over last office visit.    Pt has poor balance.  Using walker today and seems stronger than 2 months ago.    Skin: Skin is warm and dry. No rash noted.  Moderate to severely jaundiced.   Psychiatric:  Eythymic mood, flat affect.  Speech delayed but at baseline.  Pt denies active hallucinations.    Results for orders placed or performed in visit on 06/26/17  POCT urinalysis dipstick  Result Value Ref Range   Color, UA dark    Clarity, UA clear    Glucose, UA neg    Bilirubin, UA Mod    Ketones, UA Neg    Spec Grav, UA 1.025 1.010 - 1.025   Blood, UA mod    pH, UA 5.0 5.0 - 8.0   Protein, UA neg    Urobilinogen, UA 0.2 0.2 or 1.0 E.U./dL   Nitrite, UA neg    Leukocytes, UA Negative Negative      Assessment & Plan:   Problem List Items Addressed This Visit      Digestive   End stage liver disease (HCC)    Improved symptoms in clinic today, however worsening jaundice noted.  Pt has very minimal liver flap today.  Pt seems to have improved symptom control w/ hospice care through [facility]. Significant reduction in ED utilization rates as well with only one ED visit for fall/dizziness and one for schizophrenia exacerbation that resulted in 8 day inpatient hospitalization.   Plan: 1. Encouraged 3 doses lactulose daily.  Should put his lactulose cup next to his pills so he can remember to take it. 2. Continue hospice care.  If continued improvement, may not  continue to qualify.  Would need to continue home care if possible. 3. Follow up 4 weeks or sooner if needed. Pt verbalizes understanding.  Will continue to collaborate closely w/ [facility] hospice.        Other   Muscle weakness (generalized)    Improving over last appointments in clinic when pt needed use of a wheelchair.  Now able to ambulate independently w/ walker.  Still has balance difficulty, but more mobile.  Likely 2/2 liver disease.  Pt does admit to frequent falls at home, but exhibits poor utilization of walker when making transitions from standing to seated.  Plan: 1. Continue activity as tolerated.  Encourage daily ADLs. 2. Continue using 2-wheel walker. 3. Follow up as needed.      Seizures (HCC)    Possible improvement  of seziures with last seizure approximately one month ago which is reported as mild.  Onset 05/2016 and was being seen by neurology at [facility].  Last visit in January 2018 and pt states he doesn't know who prescribes his seizure medicine.  Plan: 1. Continue current medications. 2. Continue follow up with neurology.  Will send this office note to Dr. Malvin Johns and request Dr. Daisy Blossom office contact pt for followup. 3. Follow up w/ me prn.      Relevant Medications   levETIRAcetam (KEPPRA) 750 MG tablet   Schizophrenia, paranoid (HCC)    Currently stable after inpatient hospitalization on new regimen of clozapine only.  Managed by [name] and pt continues to keep ongoing communication with his clinic.    Plan: 1. Continue current medications. 2. Will send new medication list to [name] w/ prn meds for hospice that were not on med list during inpatient visit. 3. Follow up as needed w/ me and continue regular care w/ psychiatry.       Other Visit Diagnoses    Dark brown-colored urine    -  Primary   Pt reported urine quality.  No acute change, but is concern for pt.  Has no other symptoms.  Plan: 1. POCT UA: moderate bili and blood; SG  1.030 2. Microscopy sent.  Will refer back to GI, nephrology, or urology depending on results.   Relevant Orders   POCT urinalysis dipstick (Completed)   Urinalysis, microscopic only       Follow up plan: Return in about 4 weeks (around 07/24/2017) for liver failure.  [name], DNP, AGPCNP-BC Adult Gerontology Primary Care Nurse Practitioner [facility] 06/26/2017, 5:21 PM

## 2017-06-27 LAB — URINALYSIS, MICROSCOPIC ONLY
Casts: NONE SEEN /lpf
Epithelial Cells (non renal): NONE SEEN /hpf (ref 0–10)

## 2017-06-27 LAB — SPECIMEN STATUS REPORT

## 2017-06-28 ENCOUNTER — Inpatient Hospital Stay
Admission: EM | Admit: 2017-06-28 | Discharge: 2017-07-01 | DRG: 378 | Disposition: A | Discharge status: Home Health | Attending: Internal Medicine | Admitting: Internal Medicine

## 2017-06-28 ENCOUNTER — Encounter: Payer: Self-pay | Admitting: Emergency Medicine

## 2017-06-28 DIAGNOSIS — K7031 Alcoholic cirrhosis of liver with ascites: Secondary | ICD-10-CM | POA: Diagnosis present

## 2017-06-28 DIAGNOSIS — E119 Type 2 diabetes mellitus without complications: Secondary | ICD-10-CM

## 2017-06-28 DIAGNOSIS — K922 Gastrointestinal hemorrhage, unspecified: Secondary | ICD-10-CM

## 2017-06-28 DIAGNOSIS — Z79899 Other long term (current) drug therapy: Secondary | ICD-10-CM | POA: Diagnosis not present

## 2017-06-28 DIAGNOSIS — I8500 Esophageal varices without bleeding: Secondary | ICD-10-CM | POA: Diagnosis not present

## 2017-06-28 DIAGNOSIS — I4891 Unspecified atrial fibrillation: Secondary | ICD-10-CM | POA: Diagnosis present

## 2017-06-28 DIAGNOSIS — K920 Hematemesis: Secondary | ICD-10-CM | POA: Diagnosis present

## 2017-06-28 DIAGNOSIS — Z7984 Long term (current) use of oral hypoglycemic drugs: Secondary | ICD-10-CM | POA: Diagnosis not present

## 2017-06-28 DIAGNOSIS — F329 Major depressive disorder, single episode, unspecified: Secondary | ICD-10-CM | POA: Diagnosis present

## 2017-06-28 DIAGNOSIS — K7290 Hepatic failure, unspecified without coma: Secondary | ICD-10-CM | POA: Diagnosis present

## 2017-06-28 DIAGNOSIS — E785 Hyperlipidemia, unspecified: Secondary | ICD-10-CM | POA: Diagnosis present

## 2017-06-28 DIAGNOSIS — J441 Chronic obstructive pulmonary disease with (acute) exacerbation: Secondary | ICD-10-CM | POA: Diagnosis present

## 2017-06-28 DIAGNOSIS — E042 Nontoxic multinodular goiter: Secondary | ICD-10-CM | POA: Diagnosis present

## 2017-06-28 DIAGNOSIS — R0602 Shortness of breath: Secondary | ICD-10-CM

## 2017-06-28 DIAGNOSIS — F1010 Alcohol abuse, uncomplicated: Secondary | ICD-10-CM

## 2017-06-28 DIAGNOSIS — F1020 Alcohol dependence, uncomplicated: Secondary | ICD-10-CM | POA: Diagnosis present

## 2017-06-28 DIAGNOSIS — I10 Essential (primary) hypertension: Secondary | ICD-10-CM | POA: Diagnosis present

## 2017-06-28 DIAGNOSIS — K2921 Alcoholic gastritis with bleeding: Principal | ICD-10-CM | POA: Diagnosis present

## 2017-06-28 DIAGNOSIS — K766 Portal hypertension: Secondary | ICD-10-CM | POA: Diagnosis present

## 2017-06-28 DIAGNOSIS — Z23 Encounter for immunization: Secondary | ICD-10-CM | POA: Diagnosis not present

## 2017-06-28 DIAGNOSIS — K7030 Alcoholic cirrhosis of liver without ascites: Secondary | ICD-10-CM | POA: Diagnosis not present

## 2017-06-28 DIAGNOSIS — I8510 Secondary esophageal varices without bleeding: Secondary | ICD-10-CM | POA: Diagnosis present

## 2017-06-28 DIAGNOSIS — K7460 Unspecified cirrhosis of liver: Secondary | ICD-10-CM

## 2017-06-28 DIAGNOSIS — K2920 Alcoholic gastritis without bleeding: Secondary | ICD-10-CM

## 2017-06-28 DIAGNOSIS — F200 Paranoid schizophrenia: Secondary | ICD-10-CM | POA: Diagnosis present

## 2017-06-28 LAB — COMPREHENSIVE METABOLIC PANEL
ALK PHOS: 108 U/L (ref 38–126)
ALT: 34 U/L (ref 17–63)
ANION GAP: 6 (ref 5–15)
AST: 36 U/L (ref 15–41)
Albumin: 3.4 g/dL — ABNORMAL LOW (ref 3.5–5.0)
BILIRUBIN TOTAL: 0.6 mg/dL (ref 0.3–1.2)
BUN: 12 mg/dL (ref 6–20)
CALCIUM: 8.8 mg/dL — AB (ref 8.9–10.3)
CO2: 25 mmol/L (ref 22–32)
Chloride: 107 mmol/L (ref 101–111)
Creatinine, Ser: 0.82 mg/dL (ref 0.61–1.24)
GFR calc non Af Amer: >60 mL/min (ref >60)
Glucose, Bld: 193 mg/dL — ABNORMAL HIGH (ref 65–99)
POTASSIUM: 3.8 mmol/L (ref 3.5–5.1)
SODIUM: 138 mmol/L (ref 135–145)
TOTAL PROTEIN: 7.4 g/dL (ref 6.5–8.1)

## 2017-06-28 LAB — CBC
HCT: 32.1 % — ABNORMAL LOW (ref 40.0–52.0)
HEMOGLOBIN: 10.9 g/dL — AB (ref 13.0–18.0)
MCH: 30.7 pg (ref 26.0–34.0)
MCHC: 33.8 g/dL (ref 32.0–36.0)
MCV: 90.8 fL (ref 80.0–100.0)
Platelets: 117 10*3/uL — ABNORMAL LOW (ref 150–440)
RBC: 3.53 MIL/uL — AB (ref 4.40–5.90)
RDW: 14.9 % — ABNORMAL HIGH (ref 11.5–14.5)
WBC: 6.3 10*3/uL (ref 3.8–10.6)

## 2017-06-28 LAB — TYPE AND SCREEN
ABO/RH(D): O POS
ANTIBODY SCREEN: NEGATIVE

## 2017-06-28 LAB — PROTIME-INR
INR: 1.18
PROTHROMBIN TIME: 14.9 s (ref 11.4–15.2)

## 2017-06-28 MED ORDER — VITAMIN B-1 100 MG PO TABS
100.0000 mg | ORAL_TABLET | Freq: Every day | ORAL | Status: DC
  Administered 2017-06-29 – 2017-07-01 (×2): 100 mg via ORAL
  Filled 2017-06-28 (×3): qty 1

## 2017-06-28 MED ORDER — ADULT MULTIVITAMIN W/MINERALS CH
1.0000 | ORAL_TABLET | Freq: Every day | ORAL | Status: DC
  Administered 2017-06-29 – 2017-07-01 (×3): 1 via ORAL
  Filled 2017-06-28 (×3): qty 1

## 2017-06-28 MED ORDER — LORAZEPAM 2 MG/ML IJ SOLN
1.0000 mg | Freq: Four times a day (QID) | INTRAMUSCULAR | Status: DC | PRN
  Administered 2017-06-30: 1 mg via INTRAVENOUS
  Filled 2017-06-28: qty 1

## 2017-06-28 MED ORDER — THIAMINE HCL 100 MG/ML IJ SOLN
100.0000 mg | Freq: Every day | INTRAMUSCULAR | Status: DC
  Administered 2017-06-30: 100 mg via INTRAVENOUS
  Filled 2017-06-28: qty 2

## 2017-06-28 MED ORDER — FOLIC ACID 1 MG PO TABS
1.0000 mg | ORAL_TABLET | Freq: Every day | ORAL | Status: DC
  Administered 2017-06-29 – 2017-07-01 (×3): 1 mg via ORAL
  Filled 2017-06-28 (×3): qty 1

## 2017-06-28 MED ORDER — LORAZEPAM 1 MG PO TABS: 1.0000 mg | ORAL_TABLET | Freq: Four times a day (QID) | ORAL | Status: DC | PRN

## 2017-06-28 MED ORDER — PANTOPRAZOLE SODIUM 40 MG IV SOLR
40.0000 mg | Freq: Once | INTRAVENOUS | Status: AC
  Administered 2017-06-28: 40 mg via INTRAVENOUS
  Filled 2017-06-28: qty 40

## 2017-06-28 MED ORDER — OCTREOTIDE LOAD VIA INFUSION
50.0000 ug | Freq: Once | INTRAVENOUS | Status: AC
  Administered 2017-06-28: 50 ug via INTRAVENOUS
  Filled 2017-06-28: qty 25

## 2017-06-28 MED ORDER — SODIUM CHLORIDE 0.9 % IV BOLUS (SEPSIS)
1000.0000 mL | Freq: Once | INTRAVENOUS | Status: AC
  Administered 2017-06-28: 1000 mL via INTRAVENOUS

## 2017-06-28 MED ORDER — SODIUM CHLORIDE 0.9 % IV SOLN
50.0000 ug/h | INTRAVENOUS | Status: DC
  Administered 2017-06-28 – 2017-06-30 (×4): 50 ug/h via INTRAVENOUS
  Filled 2017-06-28 (×9): qty 1

## 2017-06-28 NOTE — ED Notes (Signed)
Pt up to bedside to use the urinal

## 2017-06-28 NOTE — ED Provider Notes (Signed)
[facility] Emergency Department Provider Note  ____________________________________________  Time seen: Approximately 9:52 PM  I have reviewed the triage vital signs and the nursing notes.   HISTORY  Chief Complaint Hematemesis  Level 5 Caveat: Portions of the History and Physical are unable to be obtained due to patient being a poor historian   HPI Howard [name] is a 36 y.o. male who complains of upper abdominal pain and hematemesis that started today. He reports that he's been drinking heavily of beer for the past 3 days. He has a history of cirrhosis. Reports compliance with his lactulose. He reports that he's been seeing lots of blood in his vomit, not just red streaks. Also has noticed some red blood in his stool.     Past Medical History:  Diagnosis Date  . Alcoholic cirrhosis of liver with ascites (HCC)   . Alcoholism (HCC)   . Anemia   . Atrial fibrillation (HCC)   . COPD (chronic obstructive pulmonary disease) (HCC)   . Depression   . Diabetes mellitus, type II (HCC)   . Esophageal varices (HCC)   . Heart disease    irregular heart beat (palpitations) and heart murmur  . Hyperlipemia   . Hypertension   . Liver disease   . Multiple thyroid nodules   . Portal hypertensive gastropathy (HCC)   . Schizophrenia (HCC)   . Seizures [facility])      Patient Active Problem List   Diagnosis Date Noted  . Schizophrenia, paranoid (HCC) 06/09/2017  . PTSD (post-traumatic stress disorder) 06/08/2017  . Inflamed external hemorrhoid 05/13/2017  . End stage liver disease (HCC) 04/24/2017  . Esophageal varices without bleeding (HCC)   . Hematemesis 04/05/2017  . Upper GI bleed   . Iron deficiency anemia due to chronic blood loss 02/16/2017  . Nausea 01/18/2017  . Respiratory failure with hypoxia (HCC) 12/14/2016  . Seizures (HCC) 12/12/2016  . DNR (do not resuscitate) discussion 08/11/2016  . Muscle weakness (generalized)   . GI bleed  08/07/2016  . OSA (obstructive sleep apnea) 04/29/2016  . Anemia 04/29/2016  . Thrombocytopenia (HCC) 04/29/2016  . Coagulopathy (HCC) 04/29/2016  . Obesity 04/29/2016  . Controlled type 2 diabetes mellitus without complication (HCC) 01/15/2016  . Essential (primary) hypertension 12/11/2015  . Pure hypercholesterolemia 12/11/2015  . Hepatic encephalopathy (HCC) 10/16/2015  . 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 Elevated transaminase level 07/04/2015  . Alcohol abuse 05/31/2015  . Paranoid schizophrenia (HCC) 03/20/2015     Past Surgical History:  Procedure Laterality Date  . ESOPHAGOGASTRODUODENOSCOPY N/A 10/05/2015   Procedure: ESOPHAGOGASTRODUODENOSCOPY (EGD);  Surgeon: Elnita Maxwell, MD;  Location: [facility] ENDOSCOPY;  Service: Endoscopy;  Laterality: N/A;  . ESOPHAGOGASTRODUODENOSCOPY (EGD) WITH PROPOFOL N/A 08/03/2015   Procedure: ESOPHAGOGASTRODUODENOSCOPY (EGD) WITH PROPOFOL;  Surgeon: Elnita Maxwell, MD;  Location: [facility] ENDOSCOPY;  Service: Endoscopy;  Laterality: N/A;  . ESOPHAGOGASTRODUODENOSCOPY (EGD) WITH PROPOFOL N/A 08/31/2015   Procedure: ESOPHAGOGASTRODUODENOSCOPY (EGD) WITH PROPOFOL;  Surgeon: Elnita Maxwell, MD;  Location: [facility] ENDOSCOPY;  Service: Endoscopy;  Laterality: N/A;  . ESOPHAGOGASTRODUODENOSCOPY (EGD) WITH PROPOFOL N/A 04/04/2016   Procedure: ESOPHAGOGASTRODUODENOSCOPY (EGD) WITH PROPOFOL;  Surgeon: Scot Jun, MD;  Location: [facility] ENDOSCOPY;  Service: Endoscopy;  Laterality: N/A;  . ESOPHAGOGASTRODUODENOSCOPY (EGD) WITH PROPOFOL N/A 11/13/2016   Procedure: ESOPHAGOGASTRODUODENOSCOPY (EGD) WITH PROPOFOL;  Surgeon: Wyline Mood, MD;  Location: [facility] ENDOSCOPY;  Service: Endoscopy;  Laterality: N/A;  . ESOPHAGOGASTRODUODENOSCOPY (EGD) WITH PROPOFOL N/A 04/07/2017   Procedure: ESOPHAGOGASTRODUODENOSCOPY (EGD)  WITH PROPOFOL;  Surgeon: Midge Minium, MD;  Location: [facility] ENDOSCOPY;  Service: Endoscopy;  Laterality: N/A;  . NO PAST  SURGERIES       Prior to Admission medications   Medication Sig Start Date End Date Taking? Authorizing Provider  cloZAPine (CLOZARIL) 50 MG tablet Take 3 tablets (150 mg total) by mouth at bedtime. 06/16/17  Yes [name], [name], MD  ferrous sulfate 325 (65 FE) MG tablet Take 1 tablet (325 mg total) by mouth daily with breakfast. 06/17/17  Yes [name], [name], MD  folic acid (FOLVITE) 1 MG tablet Take 1 tablet (1 mg total) by mouth daily. 06/17/17  Yes [name], [name], MD  hydrOXYzine (ATARAX/VISTARIL) 25 MG tablet Take 1 tablet (25 mg total) by mouth 3 (three) times daily as needed for anxiety. 06/16/17  Yes [name], [name], MD  lactulose (CHRONULAC) 10 GM/15ML solution Take 67.5 mLs (45 g total) by mouth 3 (three) times daily. 06/16/17  Yes [name], [name], MD  levETIRAcetam (KEPPRA) 750 MG tablet Take 2 tablets (1,500 mg total) by mouth 2 (two) times daily. 06/16/17  Yes [name], [name], MD  LORazepam (ATIVAN) 0.5 MG tablet Take 0.5 mg by mouth every 4 (four) hours as needed for anxiety.   Yes [provider]  losartan (COZAAR) 25 MG tablet Take 1 tablet (25 mg total) by mouth daily. 06/17/17  Yes [name], [name], MD  metFORMIN (GLUCOPHAGE) 500 MG tablet Take 1 tablet (500 mg total) by mouth 2 (two) times daily with a meal. 06/16/17  Yes [name], [name], MD  Morphine Sulfate (MORPHINE CONCENTRATE) 10 mg / 0.5 ml concentrated solution Take 0.5 mg by mouth every 2 (two) hours as needed for severe pain or shortness of breath.   Yes [provider]  nadolol (CORGARD) 20 MG tablet Take 1 tablet (20 mg total) by mouth daily. 06/17/17  Yes [name], [name], MD  pantoprazole (PROTONIX) 40 MG tablet Take 1 tablet (40 mg total) by mouth daily. 06/17/17  Yes [name], [name], MD  prochlorperazine (COMPAZINE) 10 MG tablet Take 10 mg by mouth every 6 (six) hours as needed for nausea or vomiting (or migraine).   Yes [provider]  rifaximin (XIFAXAN) 550 MG TABS tablet Take 1 tablet (550 mg  total) by mouth 2 (two) times daily. 06/16/17  Yes [name], [name], MD  sucralfate (CARAFATE) 1 g tablet Take 1 tablet (1 g total) by mouth 4 (four) times daily -  with meals and at bedtime. 06/16/17  Yes [name], [name], MD  thiamine 100 MG tablet Take 1 tablet (100 mg total) by mouth daily. 06/17/17  Yes [name], [name], MD  traZODone (DESYREL) 100 MG tablet Take 100 mg by mouth at bedtime as needed for sleep.   Yes [provider]  albuterol (PROVENTIL HFA;VENTOLIN HFA) 108 (90 Base) MCG/ACT inhaler Inhale 2 puffs into the lungs every 4 (four) hours as needed for wheezing or shortness of breath. 06/16/17   [name], [name], MD     Allergies Tramadol   Family History  Problem Relation Age of Onset  . Heart disease Mother   . Hypertension Mother   . Hyperlipidemia Mother   . Stroke Father   . Heart attack Father   . Hypertension Father   . Heart disease Father   . Alcohol abuse Father   . Heart disease Brother     Social History Social History  Substance Use Topics  . Smoking status: Never Smoker  .  Smokeless tobacco: Never Used  . Alcohol use 0.0 oz/week     Comment: last drink 1 days ago (noted: 05/30/2017)    Review of Systems  Constitutional:   No fever or chills.  ENT:   No sore throat. No rhinorrhea. Cardiovascular:   No chest pain or syncope. Respiratory:   No dyspnea or cough. Gastrointestinal:   positive as above for abdominal pain and hematemesis. No diarrhea  Musculoskeletal:   Negative for focal pain or swelling All other systems reviewed and are negative except as documented above in ROS and HPI.  ____________________________________________   PHYSICAL EXAM:  VITAL SIGNS: ED Triage Vitals  Enc Vitals Group     BP 06/28/17 1926 129/71     Pulse Rate 06/28/17 1926 86     Resp 06/28/17 1926 18     Temp 06/28/17 1926 98.8 F (37.1 C)     Temp Source 06/28/17 1926 Oral     SpO2 06/28/17 1926 95 %     Weight 06/28/17 1926 (!) 330 lb (149.7 kg)      Height 06/28/17 1926  (1.905 m)     Head Circumference --      Peak Flow --      Pain Score 06/28/17 1935 5     Pain Loc --      Pain Edu? --      Excl. in GC? --     Vital signs reviewed, nursing assessments reviewed.   Constitutional:   Alert and oriented.not in distress Eyes:   No scleral icterus.  EOMI. No nystagmus. No conjunctival pallor. PERRL. ENT   Head:   Normocephalic and atraumatic.   Nose:   No congestion/rhinnorhea.    Mouth/Throat:   MMM, no pharyngeal erythema. No peritonsillar mass. no blood in oropharynx   Neck:   No meningismus. Full ROM. Hematological/Lymphatic/Immunilogical:   No cervical lymphadenopathy. Cardiovascular:   RRR. Symmetric bilateral radial and DP pulses.  No murmurs.  Respiratory:   Normal respiratory effort without tachypnea/retractions. Breath sounds are clear and equal bilaterally. No wheezes/rales/rhonchi. Gastrointestinal:   Soft with tenderness of the left upper quadrant and epigastrium. Non distended. There is no CVA tenderness.  No rebound, rigidity, or guarding.rectal exam performed with nurse at bedside, brown stool, Hemoccult positive, controls okay Genitourinary:   deferred Musculoskeletal:   Normal range of motion in all extremities. No joint effusions.  No lower extremity tenderness.  No edema. Neurologic:   Normal speech and language.  Motor grossly intact. No gross focal neurologic deficits are appreciated.  Skin:    Skin is warm, dry and intact. No rash noted.  No petechiae, purpura, or bullae.  ____________________________________________    LABS (pertinent positives/negatives) (all labs ordered are listed, but only abnormal results are displayed) Labs Reviewed  COMPREHENSIVE METABOLIC PANEL - Abnormal; Notable for the following:       Result Value   Glucose, Bld 193 (*)    Calcium 8.8 (*)    Albumin 3.4 (*)    All other components within normal limits  CBC - Abnormal; Notable for the following:    RBC  3.53 (*)    Hemoglobin 10.9 (*)    HCT 32.1 (*)    RDW 14.9 (*)    Platelets 117 (*)    All other components within normal limits  PROTIME-INR  POC OCCULT BLOOD, ED  TYPE AND SCREEN   ____________________________________________   EKG    ____________________________________________    RADIOLOGY  No results found.  ____________________________________________  PROCEDURES Procedures CRITICAL CARE Performed by: Scotty Court, Johnthan Axtman   Total critical care time: 35 minutes  Critical care time was exclusive of separately billable procedures and treating other patients.  Critical care was necessary to treat or prevent imminent or life-threatening deterioration.  Critical care was time spent personally by me on the following activities: development of treatment plan with patient and/or surrogate as well as nursing, discussions with consultants, evaluation of patient's response to treatment, examination of patient, obtaining history from patient or surrogate, ordering and performing treatments and interventions, ordering and review of laboratory studies, ordering and review of radiographic studies, pulse oximetry and re-evaluation of patient's condition.  ____________________________________________   INITIAL IMPRESSION / ASSESSMENT AND PLAN / ED COURSE  Pertinent labs & imaging results that were available during my care of the patient were reviewed by me and considered in my medical decision making (see chart for details).  As part of my medical decision making, I reviewed the following data within the electronic MEDICAL RECORD NUMBER prior ED records, historical medical records and lab results.   Patient presents with hematemesis. Has a history of cirrhosis and esophageal varices. Hemoccult positive stool. Currently no evidence of hemodynamic instability but he is at high risk due to his underlying comorbidities. I'll give him IV Protonix and octreotide, plan to observe in the  hospital overnight. I will discuss with the hospitalist. Low suspicion of pancreatitis perforation obstruction cholangitis or sepsis. Low suspicion of appendicitis or volvulus diverticulitis or intra-abdominal abscess. No evidence of any cardiopulmonary pathology such as ACS PE dissection. I doubt Boerhaave syndrome.      ____________________________________________   FINAL CLINICAL IMPRESSION(S) / ED DIAGNOSES  Final diagnoses:  UGIB (upper gastrointestinal bleed)  Acute alcoholic gastritis, presence of bleeding unspecified  Alcohol abuse  Alcoholic cirrhosis, unspecified whether ascites present [facility])      New Prescriptions   No medications on file     Portions of this note were generated with dragon dictation software. Dictation errors may occur despite best attempts at proofreading.    Sharman Cheek, MD 06/28/17 2157

## 2017-06-28 NOTE — ED Notes (Signed)
Pt ambulated back to room 7 with his walker.

## 2017-06-28 NOTE — ED Triage Notes (Signed)
Pt states that he has been throwing up large amounts of blood for the last two days. Pt is ambulatory to triage with NAD noted at this time.

## 2017-06-28 NOTE — ED Notes (Signed)
Iv infiltrated. New line inserted by nurse david and fluids infusing.

## 2017-06-28 NOTE — H&P (Signed)
[facility] - Welcome at [facility]   PATIENT NAME: Howard [name]    MR#:  [id number]  DATE OF BIRTH:  [birth year]  DATE OF ADMISSION:  06/28/2017  PRIMARY CARE PHYSICIAN: [name], NP   REQUESTING/REFERRING PHYSICIAN: Scotty Court, MD  CHIEF COMPLAINT:   Chief Complaint  Patient presents with  . Hematemesis    HISTORY OF PRESENT ILLNESS:  Howard [name]  is a 36 y.o. male who presents with Report of hematemesis yesterday and today. Patient states that he had bloody emesis 3-4 times yesterday, as well as 2-3 times today. He has a history of cirrhosis and known varices. His hemoglobin is stable in the ED today. Hospitalists were called for admission  PAST MEDICAL HISTORY:   Past Medical History:  Diagnosis Date  . Alcoholic cirrhosis of liver with ascites (HCC)   . Alcoholism (HCC)   . Anemia   . Atrial fibrillation (HCC)   . COPD (chronic obstructive pulmonary disease) (HCC)   . Depression   . Diabetes mellitus, type II (HCC)   . Esophageal varices (HCC)   . Heart disease    irregular heart beat (palpitations) and heart murmur  . Hyperlipemia   . Hypertension   . Liver disease   . Multiple thyroid nodules   . Portal hypertensive gastropathy (HCC)   . Schizophrenia (HCC)   . Seizures (HCC)     PAST SURGICAL HISTORY:   Past Surgical History:  Procedure Laterality Date  . ESOPHAGOGASTRODUODENOSCOPY N/A 10/05/2015   Procedure: ESOPHAGOGASTRODUODENOSCOPY (EGD);  Surgeon: Elnita Maxwell, MD;  Location: [facility] ENDOSCOPY;  Service: Endoscopy;  Laterality: N/A;  . ESOPHAGOGASTRODUODENOSCOPY (EGD) WITH PROPOFOL N/A 08/03/2015   Procedure: ESOPHAGOGASTRODUODENOSCOPY (EGD) WITH PROPOFOL;  Surgeon: Elnita Maxwell, MD;  Location: [facility] ENDOSCOPY;  Service: Endoscopy;  Laterality: N/A;  . ESOPHAGOGASTRODUODENOSCOPY (EGD) WITH PROPOFOL N/A 08/31/2015   Procedure: ESOPHAGOGASTRODUODENOSCOPY (EGD) WITH PROPOFOL;  Surgeon: Elnita Maxwell, MD;   Location: [facility] ENDOSCOPY;  Service: Endoscopy;  Laterality: N/A;  . ESOPHAGOGASTRODUODENOSCOPY (EGD) WITH PROPOFOL N/A 04/04/2016   Procedure: ESOPHAGOGASTRODUODENOSCOPY (EGD) WITH PROPOFOL;  Surgeon: Scot Jun, MD;  Location: [facility] ENDOSCOPY;  Service: Endoscopy;  Laterality: N/A;  . ESOPHAGOGASTRODUODENOSCOPY (EGD) WITH PROPOFOL N/A 11/13/2016   Procedure: ESOPHAGOGASTRODUODENOSCOPY (EGD) WITH PROPOFOL;  Surgeon: Wyline Mood, MD;  Location: [facility] ENDOSCOPY;  Service: Endoscopy;  Laterality: N/A;  . ESOPHAGOGASTRODUODENOSCOPY (EGD) WITH PROPOFOL N/A 04/07/2017   Procedure: ESOPHAGOGASTRODUODENOSCOPY (EGD) WITH PROPOFOL;  Surgeon: Midge Minium, MD;  Location: [facility] ENDOSCOPY;  Service: Endoscopy;  Laterality: N/A;  . NO PAST SURGERIES      SOCIAL HISTORY:   Social History  Substance Use Topics  . Smoking status: Never Smoker  . Smokeless tobacco: Never Used  . Alcohol use 0.0 oz/week     Comment: last drink 1 days ago (noted: 05/30/2017)    FAMILY HISTORY:   Family History  Problem Relation Age of Onset  . Heart disease Mother   . Hypertension Mother   . Hyperlipidemia Mother   . Stroke Father   . Heart attack Father   . Hypertension Father   . Heart disease Father   . Alcohol abuse Father   . Heart disease Brother     DRUG ALLERGIES:   Allergies  Allergen Reactions  . Tramadol Itching    MEDICATIONS AT HOME:   Prior to Admission medications   Medication Sig Start Date End Date Taking? Authorizing Provider  cloZAPine (CLOZARIL) 50 MG tablet Take 3 tablets (150 mg total) by mouth  at bedtime. 06/16/17  Yes [name], [name], MD  ferrous sulfate 325 (65 FE) MG tablet Take 1 tablet (325 mg total) by mouth daily with breakfast. 06/17/17  Yes [name], [name], MD  folic acid (FOLVITE) 1 MG tablet Take 1 tablet (1 mg total) by mouth daily. 06/17/17  Yes [name], [name], MD  hydrOXYzine (ATARAX/VISTARIL) 25 MG tablet Take 1 tablet (25 mg total) by mouth 3 (three) times daily as  needed for anxiety. 06/16/17  Yes [name], [name], MD  lactulose (CHRONULAC) 10 GM/15ML solution Take 67.5 mLs (45 g total) by mouth 3 (three) times daily. 06/16/17  Yes [name], [name], MD  levETIRAcetam (KEPPRA) 750 MG tablet Take 2 tablets (1,500 mg total) by mouth 2 (two) times daily. 06/16/17  Yes [name], [name], MD  LORazepam (ATIVAN) 0.5 MG tablet Take 0.5 mg by mouth every 4 (four) hours as needed for anxiety.   Yes [provider]  losartan (COZAAR) 25 MG tablet Take 1 tablet (25 mg total) by mouth daily. 06/17/17  Yes [name], [name], MD  metFORMIN (GLUCOPHAGE) 500 MG tablet Take 1 tablet (500 mg total) by mouth 2 (two) times daily with a meal. 06/16/17  Yes [name], [name], MD  Morphine Sulfate (MORPHINE CONCENTRATE) 10 mg / 0.5 ml concentrated solution Take 0.5 mg by mouth every 2 (two) hours as needed for severe pain or shortness of breath.   Yes [provider]  nadolol (CORGARD) 20 MG tablet Take 1 tablet (20 mg total) by mouth daily. 06/17/17  Yes [name], [name], MD  pantoprazole (PROTONIX) 40 MG tablet Take 1 tablet (40 mg total) by mouth daily. 06/17/17  Yes [name], [name], MD  prochlorperazine (COMPAZINE) 10 MG tablet Take 10 mg by mouth every 6 (six) hours as needed for nausea or vomiting (or migraine).   Yes [provider]  rifaximin (XIFAXAN) 550 MG TABS tablet Take 1 tablet (550 mg total) by mouth 2 (two) times daily. 06/16/17  Yes [name], [name], MD  sucralfate (CARAFATE) 1 g tablet Take 1 tablet (1 g total) by mouth 4 (four) times daily -  with meals and at bedtime. 06/16/17  Yes [name], [name], MD  thiamine 100 MG tablet Take 1 tablet (100 mg total) by mouth daily. 06/17/17  Yes [name], [name], MD  traZODone (DESYREL) 100 MG tablet Take 100 mg by mouth at bedtime as needed for sleep.   Yes [provider]  albuterol (PROVENTIL HFA;VENTOLIN HFA) 108 (90 Base) MCG/ACT inhaler Inhale 2 puffs into the lungs every 4 (four) hours as needed for  wheezing or shortness of breath. 06/16/17   [name], [name], MD    REVIEW OF SYSTEMS:  Review of Systems  Constitutional: Negative for chills, fever, malaise/fatigue and weight loss.  HENT: Negative for ear pain, hearing loss and tinnitus.   Eyes: Negative for blurred vision, double vision, pain and redness.  Respiratory: Negative for cough, hemoptysis and shortness of breath.   Cardiovascular: Negative for chest pain, palpitations, orthopnea and leg swelling.  Gastrointestinal: Negative for abdominal pain, constipation, diarrhea, nausea and vomiting.       Hematemesis  Genitourinary: Negative for dysuria, frequency and hematuria.  Musculoskeletal: Negative for back pain, joint pain and neck pain.  Skin:       No acne, rash, or lesions  Neurological: Negative for dizziness, tremors, focal weakness and weakness.  Endo/Heme/Allergies: Negative for polydipsia. Does not bruise/bleed easily.  Psychiatric/Behavioral: Negative for depression. The patient  is not nervous/anxious and does not have insomnia.      VITAL SIGNS:   Vitals:   06/28/17 1926 06/28/17 2100 06/28/17 2130 06/28/17 2200  BP: 129/71 (!) 197/128 (!) 174/97 (!) 145/97  Pulse: 86 91 85   Resp: 18     Temp: 98.8 F (37.1 C)     TempSrc: Oral     SpO2: 95% 93% 94%   Weight: (!) 149.7 kg (330 lb)     Height:  (1.905 m)      Wt Readings from Last 3 Encounters:  06/28/17 (!) 149.7 kg (330 lb)  06/26/17 (!) 153.1 kg (337 lb 8 oz)  06/23/17 (!) 151.7 kg (334 lb 6.4 oz)    PHYSICAL EXAMINATION:  Physical Exam  Vitals reviewed. Constitutional: He is oriented to person, place, and time. He appears well-developed and well-nourished. No distress.  HENT:  Head: Normocephalic and atraumatic.  Mouth/Throat: Oropharynx is clear and moist.  Eyes: Pupils are equal, round, and reactive to light. Conjunctivae and EOM are normal. No scleral icterus.  Neck: Normal range of motion. Neck supple. No JVD present. No thyromegaly  present.  Cardiovascular: Normal rate, regular rhythm and intact distal pulses.  Exam reveals no gallop and no friction rub.   No murmur heard. Respiratory: Effort normal and breath sounds normal. No respiratory distress. He has no wheezes. He has no rales.  GI: Soft. Bowel sounds are normal. He exhibits no distension. There is no tenderness.  Musculoskeletal: Normal range of motion. He exhibits no edema.  No arthritis, no gout  Lymphadenopathy:    He has no cervical adenopathy.  Neurological: He is alert and oriented to person, place, and time. No cranial nerve deficit.  No dysarthria, no aphasia  Skin: Skin is warm and dry. No rash noted. No erythema.  Psychiatric: He has a normal mood and affect. His behavior is normal. Judgment and thought content normal.    LABORATORY PANEL:   CBC  Recent Labs Lab 06/28/17 1938  WBC 6.3  HGB 10.9*  HCT 32.1*  PLT 117*   ------------------------------------------------------------------------------------------------------------------  Chemistries   Recent Labs Lab 06/28/17 1938  NA 138  K 3.8  CL 107  CO2 25  GLUCOSE 193*  BUN 12  CREATININE 0.82  CALCIUM 8.8*  AST 36  ALT 34  ALKPHOS 108  BILITOT 0.6   ------------------------------------------------------------------------------------------------------------------  Cardiac Enzymes No results for input(s): TROPONINI in the last 168 hours. ------------------------------------------------------------------------------------------------------------------  RADIOLOGY:  No results found.  EKG:   Orders placed or performed during the hospital encounter of 06/08/17  . EKG 12-Lead  . EKG 12-Lead  . EKG 12-Lead    IMPRESSION AND PLAN:  Principal Problem:   Hematemesis - IV octreotide, twice a day IV pantoprazole, monitor hemoglobin tonight, GI consult Active Problems:   Esophageal varices (HCC) - treatment as above, continue home meds   Paranoid schizophrenia (HCC) -  continue home medications   Alcoholic cirrhosis of liver with ascites (HCC) - continue home meds including lactulose and rifaximin, CIWA protocol   Controlled type 2 diabetes mellitus without complication (HCC) - sliding scale insulin with corresponding glucose checks   Essential (primary) hypertension - continue home meds  All the records are reviewed and case discussed with ED provider. Management plans discussed with the patient and/or family.  DVT PROPHYLAXIS: Mechanical only  GI PROPHYLAXIS: PPI  ADMISSION STATUS: Inpatient  CODE STATUS: Full Code Status History    Date Active Date Inactive Code Status Order ID Comments User  Context   06/09/2017 11:43 AM 06/16/2017  6:04 PM Full Code [id number]  Audery Amel, MD Inpatient   04/19/2017  4:08 PM 04/20/2017  4:18 PM Full Code [id number]  Gracelyn Nurse, MD Inpatient   04/14/2017  7:47 PM 04/16/2017  5:38 PM Full Code [id number]  Alford Highland, MD ED   04/06/2017  2:17 AM 04/07/2017  4:14 PM Full Code [id number]  Oralia Manis, MD Inpatient   03/26/2017  8:25 AM 03/27/2017  5:25 PM Full Code [id number]  Ihor Austin, MD Inpatient   01/18/2017  4:35 PM 01/19/2017  3:16 PM Full Code [id number]  Marguarite Arbour, MD Inpatient   01/05/2017  2:57 PM 01/05/2017  9:46 PM Full Code [id number]  Arnaldo Natal, MD ED   12/30/2016  2:51 AM 01/01/2017  2:33 PM Full Code [id number]  Oralia Manis, MD Inpatient   12/18/2016 12:25 AM 12/19/2016  9:28 PM Full Code [id number]  Katharina Caper, MD Inpatient   12/12/2016 11:40 PM 12/15/2016  8:28 PM Full Code [id number]  Arnaldo Natal, MD Inpatient   11/24/2016  1:11 AM 11/25/2016  4:42 PM Full Code [id number]  Hugelmeyer, Alexis, DO Inpatient   11/12/2016  3:36 PM 11/14/2016  2:36 PM Full Code [id number]  Auburn Bilberry, MD Inpatient   10/04/2016  6:04 PM 10/08/2016  3:44 PM Full Code [id number]  Shaune Pollack, MD Inpatient   09/22/2016  2:29 AM 09/23/2016  6:38 PM Full Code [id number]  Oralia Manis, MD Inpatient   08/07/2016  7:37 PM  08/12/2016  8:51 PM Full Code [id number]  Gracelyn Nurse, MD Inpatient   07/07/2016  1:40 AM 07/09/2016  6:29 PM Full Code [id number]  Ihor Austin, MD Inpatient   06/10/2016  6:21 AM 06/11/2016  8:25 PM Full Code [id number]  Ihor Austin, MD Inpatient   05/12/2016  3:55 AM 05/15/2016  2:57 PM Full Code [id number]  Ihor Austin, MD Inpatient   04/28/2016  2:54 AM 04/29/2016  6:23 PM Full Code [id number]  Hugelmeyer, Alexis, DO Inpatient   03/14/2016 12:30 PM 03/16/2016  3:41 PM Full Code [id number]  Shaune Pollack, MD Inpatient   03/03/2016 12:13 PM 03/06/2016  2:26 PM Full Code [id number]  Milagros Loll, MD ED   02/10/2016  5:54 PM 02/12/2016  7:34 PM Full Code [id number]  Ramonita Lab, MD Inpatient   11/11/2015  5:16 AM 11/13/2015  6:00 PM Full Code [id number]  Ihor Austin, MD Inpatient   11/07/2015 10:02 PM 11/09/2015  4:56 PM Full Code [id number]  Enid Baas, MD Inpatient   10/28/2015  8:14 PM 10/29/2015  2:26 PM Full Code [id number]  Altamese Dilling, MD Inpatient   10/16/2015 11:47 PM 10/17/2015  6:00 PM Full Code [id number]  Oralia Manis, MD Inpatient   07/30/2015  5:35 PM 08/04/2015  8:34 PM Full Code [id number]  Enedina Finner, MD Inpatient   07/05/2015  1:16 AM 07/08/2015  7:22 PM Full Code [id number]  Oralia Manis, MD Inpatient   05/30/2015  7:28 PM 06/01/2015  3:36 PM Full Code [id number]  [name], [name], MD Inpatient    Advance Directive Documentation     Most Recent Value  Type of Advance Directive  Living will, Out of facility DNR (pink MOST or yellow form)  Pre-existing out of facility DNR order (yellow form or pink MOST form)  -  "MOST" Form in Place?  -      TOTAL TIME TAKING CARE OF THIS PATIENT: 45 minutes.   Dalyla Chui FIELDING 06/28/2017, 10:34 PM  [facility]  Office  [phone]  CC: Primary care physician; Mikey College, NP  Note:  This document was prepared using Dragon voice recognition software and may include unintentional dictation errors.

## 2017-06-29 ENCOUNTER — Inpatient Hospital Stay

## 2017-06-29 DIAGNOSIS — K7030 Alcoholic cirrhosis of liver without ascites: Secondary | ICD-10-CM

## 2017-06-29 DIAGNOSIS — K920 Hematemesis: Secondary | ICD-10-CM

## 2017-06-29 LAB — CBC WITH DIFFERENTIAL/PLATELET
BASOS ABS: 0 10*3/uL (ref 0–0.1)
BASOS PCT: 0 %
EOS ABS: 0.3 10*3/uL (ref 0–0.7)
EOS PCT: 4 %
HCT: 31.1 % — ABNORMAL LOW (ref 40.0–52.0)
Hemoglobin: 10.4 g/dL — ABNORMAL LOW (ref 13.0–18.0)
Lymphocytes Relative: 17 %
Lymphs Abs: 1 10*3/uL (ref 1.0–3.6)
MCH: 30.3 pg (ref 26.0–34.0)
MCHC: 33.3 g/dL (ref 32.0–36.0)
MCV: 91 fL (ref 80.0–100.0)
MONO ABS: 0.6 10*3/uL (ref 0.2–1.0)
MONOS PCT: 9 %
NEUTROS ABS: 4.3 10*3/uL (ref 1.4–6.5)
Neutrophils Relative %: 70 %
PLATELETS: 103 10*3/uL — AB (ref 150–440)
RBC: 3.42 MIL/uL — ABNORMAL LOW (ref 4.40–5.90)
RDW: 15.7 % — AB (ref 11.5–14.5)
WBC: 6.1 10*3/uL (ref 3.8–10.6)

## 2017-06-29 LAB — GLUCOSE, CAPILLARY
GLUCOSE-CAPILLARY: 120 mg/dL — AB (ref 65–99)
GLUCOSE-CAPILLARY: 183 mg/dL — AB (ref 65–99)
Glucose-Capillary: 131 mg/dL — ABNORMAL HIGH (ref 65–99)
Glucose-Capillary: 70 mg/dL (ref 65–99)
Glucose-Capillary: 84 mg/dL (ref 65–99)

## 2017-06-29 LAB — VITAMIN B12: VITAMIN B 12: 395 pg/mL (ref 180–914)

## 2017-06-29 LAB — BASIC METABOLIC PANEL
ANION GAP: 4 — AB (ref 5–15)
BUN: 11 mg/dL (ref 6–20)
CALCIUM: 8.4 mg/dL — AB (ref 8.9–10.3)
CO2: 27 mmol/L (ref 22–32)
CREATININE: 0.82 mg/dL (ref 0.61–1.24)
Chloride: 111 mmol/L (ref 101–111)
GFR calc Af Amer: >60 mL/min (ref >60)
GLUCOSE: 135 mg/dL — AB (ref 65–99)
Potassium: 4.2 mmol/L (ref 3.5–5.1)
Sodium: 142 mmol/L (ref 135–145)

## 2017-06-29 LAB — AMMONIA: AMMONIA: 80 umol/L — AB (ref 9–35)

## 2017-06-29 MED ORDER — ACETAMINOPHEN 325 MG PO TABS
650.0000 mg | ORAL_TABLET | Freq: Four times a day (QID) | ORAL | Status: DC | PRN
  Administered 2017-06-29 – 2017-07-01 (×3): 650 mg via ORAL
  Filled 2017-06-29 (×4): qty 2

## 2017-06-29 MED ORDER — HYDROXYZINE HCL 25 MG PO TABS: 25.0000 mg | ORAL_TABLET | Freq: Three times a day (TID) | ORAL | Status: DC | PRN

## 2017-06-29 MED ORDER — INSULIN ASPART 100 UNIT/ML ~~LOC~~ SOLN
0.0000 [IU] | Freq: Four times a day (QID) | SUBCUTANEOUS | Status: DC
  Administered 2017-06-29 – 2017-06-30 (×3): 2 [IU] via SUBCUTANEOUS
  Administered 2017-06-30 – 2017-07-01 (×3): 1 [IU] via SUBCUTANEOUS
  Filled 2017-06-29 (×6): qty 1

## 2017-06-29 MED ORDER — FERROUS SULFATE 325 (65 FE) MG PO TABS
325.0000 mg | ORAL_TABLET | Freq: Every day | ORAL | Status: DC
  Administered 2017-07-01: 325 mg via ORAL
  Filled 2017-06-29 (×2): qty 1

## 2017-06-29 MED ORDER — SODIUM CHLORIDE 0.9 % IV SOLN: INTRAVENOUS | Status: DC

## 2017-06-29 MED ORDER — IPRATROPIUM-ALBUTEROL 0.5-2.5 (3) MG/3ML IN SOLN
3.0000 mL | Freq: Four times a day (QID) | RESPIRATORY_TRACT | Status: DC
  Administered 2017-06-29 – 2017-07-01 (×6): 3 mL via RESPIRATORY_TRACT
  Filled 2017-06-29 (×6): qty 3

## 2017-06-29 MED ORDER — MORPHINE SULFATE (CONCENTRATE) 10 MG/0.5ML PO SOLN
10.0000 mg | ORAL | Status: DC | PRN
  Administered 2017-06-29: 10 mg via ORAL
  Filled 2017-06-29: qty 1

## 2017-06-29 MED ORDER — INFLUENZA VAC SPLIT QUAD 0.5 ML IM SUSY
0.5000 mL | PREFILLED_SYRINGE | INTRAMUSCULAR | Status: AC
  Administered 2017-06-30: 0.5 mL via INTRAMUSCULAR
  Filled 2017-06-29: qty 0.5

## 2017-06-29 MED ORDER — LACTULOSE 10 GM/15ML PO SOLN
45.0000 g | Freq: Three times a day (TID) | ORAL | Status: DC
  Administered 2017-06-29 – 2017-06-30 (×4): 45 g via ORAL
  Filled 2017-06-29 (×7): qty 90

## 2017-06-29 MED ORDER — CLOZAPINE 100 MG PO TABS
150.0000 mg | ORAL_TABLET | Freq: Every day | ORAL | Status: DC
  Administered 2017-06-29 – 2017-06-30 (×3): 150 mg via ORAL
  Filled 2017-06-29 (×4): qty 2

## 2017-06-29 MED ORDER — LEVETIRACETAM 500 MG PO TABS
1500.0000 mg | ORAL_TABLET | Freq: Two times a day (BID) | ORAL | Status: DC
  Administered 2017-06-29 – 2017-07-01 (×5): 1500 mg via ORAL
  Filled 2017-06-29 (×5): qty 3

## 2017-06-29 MED ORDER — TRAZODONE HCL 100 MG PO TABS
100.0000 mg | ORAL_TABLET | Freq: Every evening | ORAL | Status: DC | PRN
  Administered 2017-06-29 – 2017-06-30 (×2): 100 mg via ORAL
  Filled 2017-06-29 (×2): qty 1

## 2017-06-29 MED ORDER — NADOLOL 20 MG PO TABS
20.0000 mg | ORAL_TABLET | Freq: Every day | ORAL | Status: DC
  Administered 2017-06-29 – 2017-07-01 (×3): 20 mg via ORAL
  Filled 2017-06-29 (×3): qty 1

## 2017-06-29 MED ORDER — METHYLPREDNISOLONE SODIUM SUCC 40 MG IJ SOLR
40.0000 mg | Freq: Two times a day (BID) | INTRAMUSCULAR | Status: DC
  Administered 2017-06-29 – 2017-07-01 (×4): 40 mg via INTRAVENOUS
  Filled 2017-06-29 (×4): qty 1

## 2017-06-29 MED ORDER — PANTOPRAZOLE SODIUM 40 MG IV SOLR
40.0000 mg | Freq: Two times a day (BID) | INTRAVENOUS | Status: DC
  Administered 2017-06-29 – 2017-06-30 (×3): 40 mg via INTRAVENOUS
  Filled 2017-06-29 (×3): qty 40

## 2017-06-29 MED ORDER — LOSARTAN POTASSIUM 25 MG PO TABS
25.0000 mg | ORAL_TABLET | Freq: Every day | ORAL | Status: DC
  Administered 2017-06-30 – 2017-07-01 (×2): 25 mg via ORAL
  Filled 2017-06-29 (×2): qty 1

## 2017-06-29 MED ORDER — ACETAMINOPHEN 650 MG RE SUPP: 650.0000 mg | Freq: Four times a day (QID) | RECTAL | Status: DC | PRN

## 2017-06-29 MED ORDER — ONDANSETRON HCL 4 MG/2ML IJ SOLN
4.0000 mg | Freq: Four times a day (QID) | INTRAMUSCULAR | Status: DC | PRN
  Administered 2017-06-29: 4 mg via INTRAVENOUS
  Filled 2017-06-29: qty 2

## 2017-06-29 MED ORDER — RIFAXIMIN 550 MG PO TABS
550.0000 mg | ORAL_TABLET | Freq: Two times a day (BID) | ORAL | Status: DC
  Administered 2017-06-29 – 2017-07-01 (×5): 550 mg via ORAL
  Filled 2017-06-29 (×6): qty 1

## 2017-06-29 MED ORDER — ONDANSETRON HCL 4 MG PO TABS: 4.0000 mg | ORAL_TABLET | Freq: Four times a day (QID) | ORAL | Status: DC | PRN

## 2017-06-29 NOTE — Progress Notes (Signed)
Patient alert and oriented, no complaints of pain.  Has had some SOB today with ambulation.  Breathing treatments/solumedrol ordered.  Patient planning to have paracentesis today.

## 2017-06-29 NOTE — Progress Notes (Signed)
Initial Nutrition Assessment  DOCUMENTATION CODES:   Morbid obesity  INTERVENTION:  1. Recommend check B12 levels 2. Continue MVI w/ Minerals, Thiamine, Folic Acid 2. Monitor for diet advancement   NUTRITION DIAGNOSIS:   Inadequate oral intake related to acute illness, chronic illness, poor appetite as evidenced by per patient/family report.  GOAL:   Patient will meet greater than or equal to 90% of their needs  MONITOR:   PO intake, I & O's, Labs, Diet advancement  REASON FOR ASSESSMENT:   Malnutrition Screening Tool    ASSESSMENT:   36 yo male with PMH multiple admissions, alcoholic cirrhosis of liver with ascites, COPD, Esophageal varices, paranoid schizophrenia, presents with hematemesis, AECOPD  Spoke with Howard [name] at bedside. He is well known to our service. His PO intake has improved some. He has been eating grits and cake for breakfast, continues not to eat lunch "unless his brother wants to get some" and will eat chicken ranch casserole or hamburger steak along with cake at [employer] for dinner.  Had some dry heaves during my visit, experiencing nausea, no vomiting yet. Per chart patient exhibits a 30 pound/8.3% insignificant weight loss since April. No recent weight loss. Unsure how much of weight loss is fluid given history of cirrhosis of liver with ascites. NFPE WNL.  Discussed with RN. Patient reports weekly falls at home. Given ETOH abuse, recommend check B12. Monitor for needs.  Labs reviewed Medications reviewed and include:  Iron, Novolog 0-9 units Q6H, Solumedrol Sandostatin gtt  Diet Order:  Diet NPO time specified Except for: Sips with Meds, Ice Chips  Skin:  Wound (see comment) (Ecchymosis to abdomen, groin)  Last BM:  06/28/2017  Height:   Ht Readings from Last 1 Encounters:  06/28/17  (1.905 m)    Weight:   Wt Readings from Last 1 Encounters:  06/28/17 (!) 330 lb (149.7 kg)    Ideal Body Weight:  89.09 kg  BMI:  Body mass index is  41.25 kg/m.  Estimated Nutritional Needs:   Kcal:  2300-2500 calories (ABW x22-25)  Protein:  150-175 grams   Fluid:  Per MD/NP/PA in setting of ascites  EDUCATION NEEDS:   Education needs no appropriate at this time  Howard Ano. Ozzie Remmers, MS, RD LDN Inpatient Clinical Dietitian Pager [phone]

## 2017-06-29 NOTE — Progress Notes (Signed)
[facility]                                                                                                                                                                                  Patient Demographics   Howard [name], is a 36 y.o. male, DOB - 28-Aug-1981, ZOX:[id number]  Admit date - 06/28/2017   Admitting Physician Oralia Manis, MD  Outpatient Primary MD for the patient is [name], NP   LOS - 1  Subjective: Patient known to me from before with multiple admissions Admitted with hematemesis now states that he is not having any further bleeding States that his breathing is worse complaints of cough and shortness of breath   Review of Systems:   CONSTITUTIONAL: No documented fever. No fatigue, weakness. No weight gain, no weight loss.  EYES: No blurry or double vision.  ENT: No tinnitus. No postnasal drip. No redness of the oropharynx.  RESPIRATORY: No cough, no wheeze, no hemoptysis. Positive dyspnea.  CARDIOVASCULAR: No chest pain. No orthopnea. No palpitations. No syncope.  GASTROINTESTINAL: No nausea, no vomiting or diarrhea. No abdominal pain. No melena or hematochezia.  GENITOURINARY: No dysuria or hematuria.  ENDOCRINE: No polyuria or nocturia. No heat or cold intolerance.  HEMATOLOGY: No anemia. No bruising. No bleeding.  INTEGUMENTARY: No rashes. No lesions.  MUSCULOSKELETAL: No arthritis. No swelling. No gout.  NEUROLOGIC: No numbness, tingling, or ataxia. No seizure-type activity.  PSYCHIATRIC: No anxiety. No insomnia. No ADD.    Vitals:   Vitals:   06/28/17 2321 06/28/17 2355 06/29/17 0528 06/29/17 0849  BP: (!) 146/98 134/77 133/66 138/79  Pulse: 78 82 78 85  Resp: Temp:  98.5 F (36.9 C) 98.8 F (37.1 C) 98.3 F (36.8 C)  TempSrc:  Oral  Oral  SpO2: 93% 94% 91% 93%  Weight:      Height:        Wt Readings from Last 3 Encounters:  06/28/17 (!) 330 lb (149.7 kg)  06/26/17 (!) 337 lb 8  oz (153.1 kg)  06/23/17 (!) 334 lb 6.4 oz (151.7 kg)     Intake/Output Summary (Last 24 hours) at 06/29/17 1258 Last data filed at 06/29/17 0606  Gross per 24 hour  Intake           823.33 ml  Output              300 ml  Net           523.33 ml    Physical Exam:   GENERAL: Pleasant-appearing in no apparent distress.  HEAD, EYES, EARS, NOSE AND THROAT: Atraumatic, normocephalic. Extraocular muscles  are intact. Pupils equal and reactive to light. Sclerae anicteric. No conjunctival injection. No oro-pharyngeal erythema.  NECK: Supple. There is no jugular venous distention. No bruits, no lymphadenopathy, no thyromegaly.  HEART: Regular rate and rhythm,. No murmurs, no rubs, no clicks.  LUNGS: Occasional wheezing bilaterally.  ABDOMEN: Soft, flat, nontender, nondistended. Has good bowel sounds. No hepatosplenomegaly appreciated.  EXTREMITIES: No evidence of any cyanosis, clubbing, or peripheral edema.  +2 pedal and radial pulses bilaterally.  NEUROLOGIC: Patient little sleepy but able to arouse SKIN: Moist and warm with no rashes appreciated.  Psych: Not anxious, depressed LN: No inguinal LN enlargement    Antibiotics   Anti-infectives    Start     Dose/Rate Route Frequency Ordered Stop   06/29/17 1000  rifaximin (XIFAXAN) tablet 550 mg     550 mg Oral 2 times daily 06/29/17 0015        Medications   Scheduled Meds: . cloZAPine  150 mg Oral QHS  . folic acid  1 mg Oral Daily  . [START ON 06/30/2017] Influenza vac split quadrivalent PF  0.5 mL Intramuscular Tomorrow-1000  . insulin aspart  0-9 Units Subcutaneous Q6H  . lactulose  45 g Oral TID  . levETIRAcetam  1,500 mg Oral BID  . multivitamin with minerals  1 tablet Oral Daily  . nadolol  20 mg Oral Daily  . pantoprazole (PROTONIX) IV  40 mg Intravenous Q12H  . rifaximin  550 mg Oral BID  . thiamine  100 mg Oral Daily   Or  . thiamine  100 mg Intravenous Daily   Continuous Infusions: . octreotide  (SANDOSTATIN)    IV  infusion 50 mcg/hr (06/29/17 0658)   PRN Meds:.acetaminophen **OR** acetaminophen, hydrOXYzine, LORazepam **OR** LORazepam, ondansetron **OR** ondansetron (ZOFRAN) IV, traZODone   Data Review:   Micro Results No results found for this or any previous visit (from the past 240 hour(s)).  Radiology Reports Dg Chest 2 View  Result Date: 06/29/2017 CLINICAL DATA:  Pt having SOB. Hx of AFIB, COPD, diabetes, hypertension. Non smoker EXAM: CHEST  2 VIEW COMPARISON:  05/03/2017 FINDINGS: Both views are degraded by patient body habitus. The frontal view is also degraded by AP portable technique. Increased retrocardiac density on the lateral view is favored to be artifactual, due to overlying soft tissues. Midline trachea. Borderline cardiomegaly. No pleural effusion or pneumothorax. Clear lungs. Apparent pulmonary interstitial prominence is favored to be due to AP portable technique. IMPRESSION: Decreased sensitivity and specificity exam due to technique related factors, as described above. Borderline cardiomegaly, without acute disease. Electronically Signed   By: Jeronimo Greaves M.D.   On: 06/29/2017 10:51     CBC  Recent Labs Lab 06/23/17 1359 06/28/17 1938 06/29/17 0348  WBC 5.7 6.3 6.1  HGB 11.2* 10.9* 10.4*  HCT 32.9* 32.1* 31.1*  PLT 134* 117* 103*  MCV 90.4 90.8 91.0  MCH 30.7 30.7 30.3  MCHC 33.9 33.8 33.3  RDW 15.1* 14.9* 15.7*  LYMPHSABS 1.2  --  1.0  MONOABS 0.6  --  0.6  EOSABS 0.2  --  0.3  BASOSABS 0.1  --  0.0    Chemistries   Recent Labs Lab 06/28/17 1938 06/29/17 0348  NA 138 142  K 3.8 4.2  CL 107 111  CO2 25 27  GLUCOSE 193* 135*  BUN 12 11  CREATININE 0.82 0.82  CALCIUM 8.8* 8.4*  AST 36  --   ALT 34  --   ALKPHOS 108  --  BILITOT 0.6  --    ------------------------------------------------------------------------------------------------------------------ estimated creatinine clearance is 191.1 mL/min (by C-G formula based on SCr of 0.82  mg/dL). ------------------------------------------------------------------------------------------------------------------ No results for input(s): HGBA1C in the last 72 hours. ------------------------------------------------------------------------------------------------------------------ No results for input(s): CHOL, HDL, LDLCALC, TRIG, CHOLHDL, LDLDIRECT in the last 72 hours. ------------------------------------------------------------------------------------------------------------------ No results for input(s): TSH, T4TOTAL, T3FREE, THYROIDAB in the last 72 hours.  Invalid input(s): FREET3 ------------------------------------------------------------------------------------------------------------------ No results for input(s): VITAMINB12, FOLATE, FERRITIN, TIBC, IRON, RETICCTPCT in the last 72 hours.  Coagulation profile  Recent Labs Lab 06/28/17 1938  INR 1.18    No results for input(s): DDIMER in the last 72 hours.  Cardiac Enzymes No results for input(s): CKMB, TROPONINI, MYOGLOBIN in the last 168 hours.  Invalid input(s): CK ------------------------------------------------------------------------------------------------------------------ Invalid input(s): POCBNP    Assessment & Plan  Patient is well known to our service with multiple admissions to the hospital 1. Hematemesis -continue IV octreotide, twice a day IV pantoprazole,  Hemoglobin is stable patient has presented multiple times with these symptoms, GI will see the patient 2. Acute COPD exasperation chest x-ray is inconclusive Treat with nebs I will place him on low-dose steroids 3.  Esophageal varices (HCC) - treatment as above, continue home meds 4. Hepatic encephalopathy ammonia slightly elevated continue  lactulose and Xifaxan     5.Paranoid schizophrenia (HCC) - continue home medications 6.   Alcoholic cirrhosis of liver with ascites (HCC) -continuing to drink 7.  Controlled type 2 diabetes mellitus  without complication (HCC) - sliding scale insulin with corresponding glucose checks 8.  Essential (primary) hypertension - continue home meds     Code Status Orders        Start     Ordered   06/29/17 0016  Full code  Continuous     06/29/17 0015    Code Status History    Date Active Date Inactive Code Status Order ID Comments User Context   06/09/2017 11:43 AM 06/16/2017  6:04 PM Full Code [id number]  Audery Amel, MD Inpatient   04/19/2017  4:08 PM 04/20/2017  4:18 PM Full Code [id number]  Gracelyn Nurse, MD Inpatient   04/14/2017  7:47 PM 04/16/2017  5:38 PM Full Code [id number]  Alford Highland, MD ED   04/06/2017  2:17 AM 04/07/2017  4:14 PM Full Code [id number]  Oralia Manis, MD Inpatient   03/26/2017  8:25 AM 03/27/2017  5:25 PM Full Code [id number]  Ihor Austin, MD Inpatient   01/18/2017  4:35 PM 01/19/2017  3:16 PM Full Code [id number]  Marguarite Arbour, MD Inpatient   01/05/2017  2:57 PM 01/05/2017  9:46 PM Full Code [id number]  Arnaldo Natal, MD ED   12/30/2016  2:51 AM 01/01/2017  2:33 PM Full Code [id number]  Oralia Manis, MD Inpatient   12/18/2016 12:25 AM 12/19/2016  9:28 PM Full Code [id number]  Katharina Caper, MD Inpatient   12/12/2016 11:40 PM 12/15/2016  8:28 PM Full Code [id number]  Arnaldo Natal, MD Inpatient   11/24/2016  1:11 AM 11/25/2016  4:42 PM Full Code [id number]  Hugelmeyer, Jon Gills, DO Inpatient   11/12/2016  3:36 PM 11/14/2016  2:36 PM Full Code [id number]  Auburn Bilberry, MD Inpatient   10/04/2016  6:04 PM 10/08/2016  3:44 PM Full Code [id number]  Shaune Pollack, MD Inpatient   09/22/2016  2:29 AM 09/23/2016  6:38 PM Full Code [id number]  Oralia Manis, MD Inpatient   08/07/2016  7:37 PM 08/12/2016  8:51 PM Full Code [id number]  Gracelyn Nurse, MD  Inpatient   07/07/2016  1:40 AM 07/09/2016  6:29 PM Full Code [id number]  Ihor Austin, MD Inpatient   06/10/2016  6:21 AM 06/11/2016  8:25 PM Full Code [id number]  Ihor Austin, MD Inpatient   05/12/2016  3:55 AM 05/15/2016  2:57 PM Full  Code [id number]  Ihor Austin, MD Inpatient   04/28/2016  2:54 AM 04/29/2016  6:23 PM Full Code [id number]  Hugelmeyer, Alexis, DO Inpatient   03/14/2016 12:30 PM 03/16/2016  3:41 PM Full Code [id number]  Shaune Pollack, MD Inpatient   03/03/2016 12:13 PM 03/06/2016  2:26 PM Full Code [id number]  Milagros Loll, MD ED   02/10/2016  5:54 PM 02/12/2016  7:34 PM Full Code [id number]  Ramonita Lab, MD Inpatient   11/11/2015  5:16 AM 11/13/2015  6:00 PM Full Code [id number]  Ihor Austin, MD Inpatient   11/07/2015 10:02 PM 11/09/2015  4:56 PM Full Code [id number]  Enid Baas, MD Inpatient   10/28/2015  8:14 PM 10/29/2015  2:26 PM Full Code [id number]  Altamese Dilling, MD Inpatient   10/16/2015 11:47 PM 10/17/2015  6:00 PM Full Code [id number]  Oralia Manis, MD Inpatient   07/30/2015  5:35 PM 08/04/2015  8:34 PM Full Code [id number]  Enedina Finner, MD Inpatient   07/05/2015  1:16 AM 07/08/2015  7:22 PM Full Code [id number]  Oralia Manis, MD Inpatient   05/30/2015  7:28 PM 06/01/2015  3:36 PM Full Code [id number]  [name], [name], MD Inpatient    Advance Directive Documentation     Most Recent Value  Type of Advance Directive  Out of facility DNR (pink MOST or yellow form)  Pre-existing out of facility DNR order (yellow form or pink MOST form)  -  "MOST" Form in Place?  -           Consults gi  DVT Prophylaxis  SCDs  Lab Results  Component Value Date   PLT 103 (L) 06/29/2017     Time Spent in minutes   Greater than 50% of time spent in care coordination and counseling patient regarding the condition and plan of care.   Auburn Bilberry M.D on 06/29/2017 at 12:58 PM  Between 7am to 6pm - Pager - [phone]  After 6pm go to [email or web address] - password [email or web address] [facility]  [facility]   Office  [phone]

## 2017-06-29 NOTE — Progress Notes (Signed)
Clozapine monitoring 10/2 ANC 3.6. Lab submitted and patient is eligible with weekly monitoring per [employer] website. Next CBC/diff 10/8.  Fulton Reek, PharmD, BCPS  06/29/17 12:37 AM

## 2017-06-29 NOTE — Care Management (Signed)
At present medicaid is listed as primary insurance.  There is a medicare card that has been scanned into Media section of Epic.  Notified Keisha means with pre service department.  Patient has been followed by [facility] in the past and recently closed.  Agency would accept patient again. he is high risk for readmission.  has strong psych issues.  Admitted with hematemesis.  Hgb stable.  GI consult pending.  Resides at home with his mother.

## 2017-06-29 NOTE — Progress Notes (Signed)
Pt. Arrived via stretcher and transferred to bed with stand by assist. Pt. On 2LNC, VSS, Pt. Alert and oriented x4. Skin assessment completed with Delaney Meigs, RN. General room orientation given, instruction on how to use call bell and ascom system given. Red allergy bracelet and yellow fall bracelet placed on right wrist. Pt. Has headache, tylenol given. Bed alarm on, cll bell and phone within reach, will continue to monitor pt.

## 2017-06-29 NOTE — Evaluation (Signed)
Physical Therapy Evaluation Patient Details Name: Aries Townley MRN: [id number] DOB: 15-Mar-1981 Today's Date: 06/29/2017   History of Present Illness  Cristin Mccorkel is a 36 y.o. male who presents with report of hematemesis yesterday and today. Patient states that he had bloody emesis 3-4 times yesterday, as well as 2-3 times today. He has a history of cirrhosis and known varices. His hemoglobin is stable in the ED today. Hospitalists were called for admission. He is currently admitted for hematemesis and acute COPD exacerbation. Pt reports that he is currently being followed by Hospice for the last month  Clinical Impression  Pt admitted with above diagnosis. Pt currently with functional limitations due to the deficits listed below (see PT Problem List). Pt moves slowly but is modified independent for bed mobility and transfers. He is able to ambulate partially around RN station with therapy. He is able to stabilize with rolling walker. SaO2 remains around 90% on 4L/min O2 during ambulation. Pt reports DOE and is unable to ambulate farther at this time. Pt able to complete seated exercises as instructed. Pt will benefit from PT services to address deficits in strength, balance, and mobility in order to return to full function at home.     Follow Up Recommendations Home health PT;Other (comment) (For balance, if followed by hospice may not be a candidate)    Equipment Recommendations  None recommended by PT    Recommendations for Other Services       Precautions / Restrictions Precautions Precautions: Fall Restrictions Weight Bearing Restrictions: No      Mobility  Bed Mobility Overal bed mobility: Modified Independent             General bed mobility comments: Increased time, HOB elevated and use of bed rails  Transfers Overall transfer level: Needs assistance Equipment used: Rolling walker (2 wheeled) Transfers: Sit to/from Stand Sit to Stand: Modified independent  (Device/Increase time)         General transfer comment: Increased time to stand and heavy UE support but able to complete without external assist.   Ambulation/Gait Ambulation/Gait assistance: Min guard Ambulation Distance (Feet): 80 Feet Assistive device: Rolling walker (2 wheeled) Gait Pattern/deviations: Decreased step length - right;Decreased step length - left Gait velocity: Decreased Gait velocity interpretation: Below normal speed for age/gender General Gait Details: Pt able to ambulate partially around RN station with therapy. He is able to stabilize with rolling walker. SaO2 remains around 90% on 4L/min O2 during ambulation. Pt reports DOE and is unable to ambulate farther at this time.   Stairs            Wheelchair Mobility    Modified Rankin (Stroke Patients Only)       Balance Overall balance assessment: Needs assistance Sitting-balance support: No upper extremity supported Sitting balance-Leahy Scale: Good     Standing balance support: No upper extremity supported Standing balance-Leahy Scale: Fair                               Pertinent Vitals/Pain Pain Assessment: No/denies pain    Home Living Family/patient expects to be discharged to:: Private residence Living Arrangements: Parent;Other relatives;Other (Comment) (Brother) Available Help at Discharge: Available 24 hours/day;Family;Personal care attendant;Other (Comment) (HH aid 2x/wk) Type of Home: House Home Access: Stairs to enter Entrance Stairs-Rails: Right;Left;Can reach both Entrance Stairs-Number of Steps: 5 Home Layout: Multi-level;Able to live on main level with bedroom/bathroom Home Equipment: Dan Humphreys - 2 wheels;Cane -  quad;Shower seat;Other (comment) (no grab bars)      Prior Function Level of Independence: Needs assistance   Gait / Transfers Assistance Needed: Pt reports that he ambulates with rolling walker at baseline. he reports approximately 10-12 falls in the last  12 months  ADL's / Homemaking Assistance Needed: Requiring assist for ADLs/IADLs. Has HH aid that comes 2x/wk        Hand Dominance   Dominant Hand: Right    Extremity/Trunk Assessment   Upper Extremity Assessment Upper Extremity Assessment: Overall WFL for tasks assessed    Lower Extremity Assessment Lower Extremity Assessment: Generalized weakness       Communication   Communication: No difficulties  Cognition Arousal/Alertness: Awake/alert Behavior During Therapy: WFL for tasks assessed/performed Overall Cognitive Status: No family/caregiver present to determine baseline cognitive functioning                                 General Comments: AOx2, partially oriented to date and situation      General Comments      Exercises General Exercises - Lower Extremity Long Arc Quad: Strengthening;Both;10 reps;Seated Heel Slides: Strengthening;Both;10 reps;Seated Hip ABduction/ADduction: Strengthening;Both;10 reps;Seated Hip Flexion/Marching: Strengthening;Both;10 reps;Seated Heel Raises: Strengthening;Both;10 reps;Seated   Assessment/Plan    PT Assessment Patient needs continued PT services  PT Problem List Decreased strength;Decreased activity tolerance;Decreased balance;Decreased mobility;Cardiopulmonary status limiting activity       PT Treatment Interventions DME instruction;Gait training;Stair training;Functional mobility training;Therapeutic exercise;Therapeutic activities;Balance training;Neuromuscular re-education;Patient/family education    PT Goals (Current goals can be found in the Care Plan section)  Acute Rehab PT Goals Patient Stated Goal: Return to prior function at home PT Goal Formulation: With patient Time For Goal Achievement: 07/13/17 Potential to Achieve Goals: Good    Frequency Min 2X/week   Barriers to discharge        Co-evaluation               AM-PAC PT "6 Clicks" Daily Activity  Outcome Measure Difficulty  turning over in bed (including adjusting bedclothes, sheets and blankets)?: None Difficulty moving from lying on back to sitting on the side of the bed? : A Little Difficulty sitting down on and standing up from a chair with arms (e.g., wheelchair, bedside commode, etc,.)?: A Little Help needed moving to and from a bed to chair (including a wheelchair)?: A Little Help needed walking in hospital room?: A Little Help needed climbing 3-5 steps with a railing? : A Little 6 Click Score: 19    End of Session Equipment Utilized During Treatment: Gait belt Activity Tolerance: Patient tolerated treatment well Patient left: in bed;with call bell/phone within reach;with bed alarm set   PT Visit Diagnosis: Unsteadiness on feet (R26.81);Repeated falls (R29.6);Muscle weakness (generalized) (M62.81);History of falling (Z91.81)    Time: 1430-1453 PT Time Calculation (min) (ACUTE ONLY): 23 min   Charges:   PT Evaluation $PT Eval Low Complexity: 1 Low PT Treatments $Therapeutic Exercise: 8-22 mins   PT G Codes:   PT G-Codes **NOT FOR INPATIENT CLASS** Functional Assessment Tool Used: AM-PAC 6 Clicks Basic Mobility Functional Limitation: Mobility: Walking and moving around Mobility: Walking and Moving Around Current Status (N8295): At least 20 percent but less than 40 percent impaired, limited or restricted Mobility: Walking and Moving Around Goal Status [phone]): At least 1 percent but less than 20 percent impaired, limited or restricted    Lynnea Maizes PT, DPT    Huprich,Jason 06/29/2017,  3:52 PM

## 2017-06-29 NOTE — Consult Note (Signed)
[name], MD [address]  [place]  [place], [place] [address]  Main: [phone]  Fax: [phone] Pager: [phone]   Consultation  Referring Provider:     No ref. provider found Primary Care Physician:  Mikey College, NP Primary Gastroenterologist: Dr. Vira Agar       Reason for Consultation:     Hematemesis  Date of Admission:  06/28/2017 Date of Consultation:  06/29/2017         HPI:   Howard [name] is a 36 y.o. 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 His last admission for cirrhosis was in 03/2018 for hematemesis and had EGD at that time by Dr. Allen Norris and found to have small varices.   He was somnolent when I saw him but able to answer questions to certain extent. He continues to drink alcohol, last drink was one day before admission and presented with hematemesis that started Friday. He presented to ER yesterday. He reports that he vomited bright red blood about 3-4 times until yesterday. He restarted drinking alcohol on Friday last week, about 3 quarts of beer daily. Per Dr. Percell Boston note from 04/2017, he is not a transplant candidate and he is now under hospice care. He reports that he has been taking his medications regularly. And, he wants to join alcohol anonymous. He reports that he had low-grade fever at home and reports suprapubic pain. He denies swelling of legs or abdominal distention. He denies melena.  His hemoglobin since admission has been fairly stable. He did not have any further episodes. He is on full liquid diet and had breakfast and lunch today. He started on PPI and octreotide  GI Procedures: Several EGDs listed in his chart. Most recent one from 03/2017 which revealed portal gastropathy and grade 1 varices. His last variceal  ligation was performed in 03/2016. EGD from 10/2016 revealed complete eradication of esophageal varices.  Past Medical History:  Diagnosis Date  . Alcoholic cirrhosis of liver with ascites ([place])   . Alcoholism ([place])   . Anemia   . Atrial fibrillation ([place])   . COPD (chronic obstructive pulmonary disease) ([place])   . Depression   . Diabetes mellitus, type II ([place])   . Esophageal varices ([place])   . Heart disease    irregular heart beat (palpitations) and heart murmur  . Hyperlipemia   . Hypertension   . Liver disease   . Multiple thyroid nodules   . Portal hypertensive gastropathy ([place])   . Schizophrenia ([place])   . Seizures ([place])     Past Surgical History:  Procedure Laterality Date  . ESOPHAGOGASTRODUODENOSCOPY N/A 10/05/2015   Procedure: ESOPHAGOGASTRODUODENOSCOPY (EGD);  Surgeon: Josefine Class, MD;  Location: [facility] ENDOSCOPY;  Service: Endoscopy;  Laterality: N/A;  . ESOPHAGOGASTRODUODENOSCOPY (EGD) WITH PROPOFOL N/A 08/03/2015   Procedure: ESOPHAGOGASTRODUODENOSCOPY (EGD) WITH PROPOFOL;  Surgeon: Josefine Class, MD;  Location[facility] ENDOSCOPY;  Service: Endoscopy;  Laterality: N/A;  . ESOPHAGOGASTRODUODENOSCOPY (EGD) WITH PROPOFOL N/A 08/31/2015   Procedure: ESOPHAGOGASTRODUODENOSCOPY (EGD) WITH PROPOFOL;  Surgeon: Josefine Class, MD;  Location: [facility] ENDOSCOPY;  Service: Endoscopy;  Laterality: N/A;  . ESOPHAGOGASTRODUODENOSCOPY (EGD) WITH PROPOFOL N/A 04/04/2016   Procedure: ESOPHAGOGASTRODUODENOSCOPY (EGD) WITH PROPOFOL;  Surgeon: Manya Silvas, MD;  Location: [facility] ENDOSCOPY;  Service: Endoscopy;  Laterality: N/A;  . ESOPHAGOGASTRODUODENOSCOPY (EGD) WITH PROPOFOL N/A 11/13/2016  Procedure: ESOPHAGOGASTRODUODENOSCOPY (EGD) WITH PROPOFOL;  Surgeon: Jonathon Bellows, MD;  Location: [facility] ENDOSCOPY;  Service: Endoscopy;  Laterality: N/A;  . ESOPHAGOGASTRODUODENOSCOPY (EGD) WITH PROPOFOL N/A 04/07/2017   Procedure: ESOPHAGOGASTRODUODENOSCOPY (EGD) WITH PROPOFOL;  Surgeon: Lucilla Lame, MD;  Location: [facility] ENDOSCOPY;  Service: Endoscopy;  Laterality: N/A;  . NO PAST SURGERIES      Prior to Admission medications   Medication Sig Start Date End Date Taking? Authorizing Provider  cloZAPine (CLOZARIL) 50 MG tablet Take 3 tablets (150 mg total) by mouth at bedtime. 06/16/17  Yes [name], Madie Reno, MD  ferrous sulfate 325 (65 FE) MG tablet Take 1 tablet (325 mg total) by mouth daily with breakfast. 06/17/17  Yes [name], Madie Reno, MD  folic acid (FOLVITE) 1 MG tablet Take 1 tablet (1 mg total) by mouth daily. 06/17/17  Yes [name], Madie Reno, MD  hydrOXYzine (ATARAX/VISTARIL) 25 MG tablet Take 1 tablet (25 mg total) by mouth 3 (three) times daily as needed for anxiety. 06/16/17  Yes [name], Madie Reno, MD  lactulose (CHRONULAC) 10 GM/15ML solution Take 67.5 mLs (45 g total) by mouth 3 (three) times daily. 06/16/17  Yes [name], Madie Reno, MD  levETIRAcetam (KEPPRA) 750 MG tablet Take 2 tablets (1,500 mg total) by mouth 2 (two) times daily. 06/16/17  Yes [name], Madie Reno, MD  LORazepam (ATIVAN) 0.5 MG tablet Take 0.5 mg by mouth every 4 (four) hours as needed for anxiety.   Yes [provider]  losartan (COZAAR) 25 MG tablet Take 1 tablet (25 mg total) by mouth daily. 06/17/17  Yes [name], Madie Reno, MD  metFORMIN (GLUCOPHAGE) 500 MG tablet Take 1 tablet (500 mg total) by mouth 2 (two) times daily with a meal. 06/16/17  Yes [name], Madie Reno, MD  Morphine Sulfate (MORPHINE CONCENTRATE) 10 mg / 0.5 ml concentrated solution Take 0.5 mg by mouth every 2 (two) hours as needed for severe pain or shortness of breath.   Yes [provider]  nadolol (CORGARD) 20 MG tablet Take 1 tablet (20 mg total) by mouth daily. 06/17/17  Yes [name], Madie Reno, MD  pantoprazole (PROTONIX) 40 MG tablet Take 1 tablet (40 mg total) by mouth daily. 06/17/17  Yes [name], Madie Reno, MD  prochlorperazine (COMPAZINE) 10 MG tablet Take 10 mg by mouth every 6 (six) hours as needed for nausea or vomiting (or migraine).   Yes  [provider]  rifaximin (XIFAXAN) 550 MG TABS tablet Take 1 tablet (550 mg total) by mouth 2 (two) times daily. 06/16/17  Yes [name], Madie Reno, MD  sucralfate (CARAFATE) 1 g tablet Take 1 tablet (1 g total) by mouth 4 (four) times daily -  with meals and at bedtime. 06/16/17  Yes [name], Madie Reno, MD  thiamine 100 MG tablet Take 1 tablet (100 mg total) by mouth daily. 06/17/17  Yes [name], Madie Reno, MD  traZODone (DESYREL) 100 MG tablet Take 100 mg by mouth at bedtime as needed for sleep.   Yes [provider]  albuterol (PROVENTIL HFA;VENTOLIN HFA) 108 (90 Base) MCG/ACT inhaler Inhale 2 puffs into the lungs every 4 (four) hours as needed for wheezing or shortness of breath. 06/16/17   [name], Madie Reno, MD    Family History  Problem Relation Age of Onset  . Heart disease Mother   . Hypertension Mother   . Hyperlipidemia Mother   . Stroke Father   . Heart attack Father   . Hypertension Father   . Heart disease Father   .  Alcohol abuse Father   . Heart disease Brother      Social History  Substance Use Topics  . Smoking status: Never Smoker  . Smokeless tobacco: Never Used  . Alcohol use 0.0 oz/week     Comment: last drink 1 days ago (noted: 05/30/2017)    Allergies as of 06/28/2017 - Review Complete 06/28/2017  Allergen Reaction Noted  . Tramadol Itching 04/03/2017    Review of Systems:    All systems reviewed and negative except where noted in HPI.   Physical Exam:  Vital signs in last 24 hours: Temp:  [98.3 F (36.8 C)-98.8 F (37.1 C)] 98.3 F (36.8 C) (10/08 1406) Pulse Rate:  [78-91] 88 (10/08 1406) Resp:  [17-20] 18 (10/08 0849) BP: (129-197)/(66-128) 152/81 (10/08 1406) SpO2:  [91 %-95 %] 95 % (10/08 1406) Weight:  [330 lb (149.7 kg)] 330 lb (149.7 kg) (10/07 1926) Last BM Date: 06/28/17 General:  Alleen Borne, but arisable, cooperative in NAD Head:  Normocephalic and atraumatic. Eyes:   No icterus.   Conjunctiva pink. PERRLA. Ears:  Normal auditory  acuity. Neck:  Supple; no masses or thyroidomegaly Lungs: Respirations even and unlabored. Lungs clear to auscultation bilaterally.   No wheezes, crackles, or rhonchi.  Heart:  Regular rate and rhythm;  Without murmur, clicks, rubs or gallops Abdomen:  Soft, moderately distended, tympanic, mild suprapubic tenderness, Normal bowel sounds. No appreciable masses or hepatomegaly.  No rebound or guarding.  Rectal:  Not performed. Msk:  Symmetrical without gross deformities.    Extremities:  Without edema, cyanosis or clubbing,healing ulcer above ankle on right LE, asterixis present. Neurologic:  Alert and oriented x2 Skin: healing ulcer above ankle on right LE.   LAB RESULTS: CBC Latest Ref Rng & Units 06/29/2017 06/28/2017 06/23/2017  WBC 3.8 - 10.6 K/uL 6.1 6.3 5.7  Hemoglobin 13.0 - 18.0 g/dL 10.4(L) 10.9(L) 11.2(L)  Hematocrit 40.0 - 52.0 % 31.1(L) 32.1(L) 32.9(L)  Platelets 150 - 440 K/uL 103(L) 117(L) 134(L)    BMET BMP Latest Ref Rng & Units 06/29/2017 06/28/2017 06/08/2017  Glucose 65 - 99 mg/dL 135(H) 193(H) 94  BUN 6 - 20 mg/dL 11 12 10   Creatinine 0.61 - 1.24 mg/dL 0.82 0.82 0.97  Sodium 135 - 145 mmol/L 142 138 138  Potassium 3.5 - 5.1 mmol/L 4.2 3.8 4.5  Chloride 101 - 111 mmol/L 111 107 105  CO2 22 - 32 mmol/L 27 25 25   Calcium 8.9 - 10.3 mg/dL 8.4(L) 8.8(L) 9.1    LFT Hepatic Function Latest Ref Rng & Units 06/28/2017 06/08/2017 05/30/2017  Total Protein 6.5 - 8.1 g/dL 7.4 8.1 7.9  Albumin 3.5 - 5.0 g/dL 3.4(L) 3.9 3.7  AST 15 - 41 U/L 36 56(H) 54(H)  ALT 17 - 63 U/L 34 49 50  Alk Phosphatase 38 - 126 U/L 108 100 96  Total Bilirubin 0.3 - 1.2 mg/dL 0.6 1.5(H) 0.9  Bilirubin, Direct 0.1 - 0.5 mg/dL - - -     STUDIES: Dg Chest 2 View  Result Date: 06/29/2017 CLINICAL DATA:  Pt having SOB. Hx of AFIB, COPD, diabetes, hypertension. Non smoker EXAM: CHEST  2 VIEW COMPARISON:  05/03/2017 FINDINGS: Both views are degraded by patient body habitus. The frontal view is also  degraded by AP portable technique. Increased retrocardiac density on the lateral view is favored to be artifactual, due to overlying soft tissues. Midline trachea. Borderline cardiomegaly. No pleural effusion or pneumothorax. Clear lungs. Apparent pulmonary interstitial prominence is favored to be due to AP portable  technique. IMPRESSION: Decreased sensitivity and specificity exam due to technique related factors, as described above. Borderline cardiomegaly, without acute disease. Electronically Signed   By: Abigail Miyamoto M.D.   On: 06/29/2017 10:51   US Abdomen Limited  Addendum Date: 06/29/2017   ADDENDUM REPORT: 06/29/2017 16:03 ADDENDUM: As the limited abdominal ultrasound survey did not show evidence of ascites, paracentesis was not performed. Electronically Signed   By: Aletta Edouard M.D.   On: 06/29/2017 16:03   Result Date: 06/29/2017 CLINICAL DATA:  Inpatient. Hepatic cirrhosis. Abdominal distention and fever. EXAM: LIMITED ABDOMEN ULTRASOUND FOR ASCITES TECHNIQUE: Limited ultrasound survey for ascites was performed in all four abdominal quadrants. COMPARISON:  04/21/2017 CT abdomen/pelvis. FINDINGS: No evidence of ascites in any of the 4 quadrants of the abdomen or in the central abdomen. Representative image of the liver demonstrates coarsened liver echotexture and irregular liver contour compatible with known cirrhosis. IMPRESSION: Cirrhosis.  No evidence of ascites. Electronically Signed: By: Ilona Sorrel M.D. On: 06/29/2017 15:40      Impression / Plan:   Howard [name] is a 36 y.o. male with Schizophrenia, alcohol abuse, presumed alcohol cirrhosis, decompensated secondary to ascites, known esophageal varices, several episodes of hematemesis leading to multiple hospitalizations, underwent variceal ligation in the past. Readmitted with hematemesis. His hemodynamics, CBC, INR have been fairly stable since admission. His workup for secondary liver disease revealed that he had  significantly elevated antimitochondrial antibodies (39.9) based on the labs from care everywhere in 06/2015. He is not on ursodiol.  Hematemesis: Differentials include progression of esophageal varices with continued alcohol intake or peptic ulcer disease or portal hypertensive gastropathy or erosive esophagitis secondary to alcohol or Mallory-Weiss tear or erosive gastritis - Nothing by mouth past midnight - Continue octreotide and PPI - Avoid anticoagulation - Monitor CBC closely - EGD tomorrow  Decompensated cirrhosis of liver: Secondary to alcohol use and also with positive antimitochondrial antibodies. 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 We will repeat AMA. But, I suspect that starting treatment with ursodiol at this point may not help as patient has ongoing insult with continued alcohol intake and he already has burnt out liver - There is no evidence of ascites based on ultrasound during this admission - No evidence of liver lesions based on CT A/P with IV contrast in 04/2017. AFP normal in 03/2017 - Continue lactulose for hepatic encephalopathy  Follow-up with Dr. Vira Agar upon discharge  Thank you for involving me in the care of this patient.      LOS: 1 day   Sherri Sear, MD  06/29/2017, 5:28 PM   Note: This dictation was prepared with Dragon dictation along with smaller phrase technology. Any transcriptional errors that result from this process are unintentional.

## 2017-06-29 NOTE — Progress Notes (Signed)
[place]oz[place] monitoring 10/8 ANC 4.3. Lab submitted and patient is eligible with weekly monitoring per [facility] website. Next CBC/diff 10/15.  Olene Floss, Pharm.D, BCPS Clinical Pharmacist  06/29/17 8:48 AM

## 2017-06-30 ENCOUNTER — Inpatient Hospital Stay: Admitting: Registered Nurse

## 2017-06-30 ENCOUNTER — Ambulatory Visit: Admitting: Psychiatry

## 2017-06-30 ENCOUNTER — Encounter
Admission: EM | Disposition: A | Discharge status: Home Health | Payer: Self-pay | Source: Home / Self Care | Attending: Internal Medicine

## 2017-06-30 ENCOUNTER — Encounter: Payer: Self-pay | Admitting: *Deleted

## 2017-06-30 DIAGNOSIS — I8500 Esophageal varices without bleeding: Secondary | ICD-10-CM

## 2017-06-30 HISTORY — PX: ESOPHAGOGASTRODUODENOSCOPY: SHX5428

## 2017-06-30 LAB — GLUCOSE, CAPILLARY
GLUCOSE-CAPILLARY: 158 mg/dL — AB (ref 65–99)
GLUCOSE-CAPILLARY: 145 mg/dL — AB (ref 65–99)
GLUCOSE-CAPILLARY: 113 mg/dL — AB (ref 65–99)
Glucose-Capillary: 133 mg/dL — ABNORMAL HIGH (ref 65–99)
Glucose-Capillary: 152 mg/dL — ABNORMAL HIGH (ref 65–99)

## 2017-06-30 LAB — MITOCHONDRIAL ANTIBODIES: MITOCHONDRIAL M2 AB, IGG: 9.2 U (ref 0.0–20.0)

## 2017-06-30 SURGERY — EGD (ESOPHAGOGASTRODUODENOSCOPY)
Anesthesia: General

## 2017-06-30 MED ORDER — MIDAZOLAM HCL 2 MG/2ML IJ SOLN
INTRAMUSCULAR | Status: AC
  Filled 2017-06-30: qty 2

## 2017-06-30 MED ORDER — PANTOPRAZOLE SODIUM 40 MG IV SOLR
40.0000 mg | INTRAVENOUS | Status: DC
  Administered 2017-07-01: 40 mg via INTRAVENOUS
  Filled 2017-06-30: qty 40

## 2017-06-30 MED ORDER — PROPOFOL 10 MG/ML IV BOLUS
INTRAVENOUS | Status: DC | PRN
  Administered 2017-06-30: 80 mg via INTRAVENOUS
  Administered 2017-06-30: 20 mg via INTRAVENOUS

## 2017-06-30 MED ORDER — FENTANYL CITRATE (PF) 100 MCG/2ML IJ SOLN
INTRAMUSCULAR | Status: DC | PRN
  Administered 2017-06-30: 50 ug via INTRAVENOUS

## 2017-06-30 MED ORDER — PROPOFOL 10 MG/ML IV BOLUS
INTRAVENOUS | Status: AC
  Filled 2017-06-30: qty 20

## 2017-06-30 MED ORDER — GLYCOPYRROLATE 0.2 MG/ML IJ SOLN
INTRAMUSCULAR | Status: DC | PRN
  Administered 2017-06-30: 0.2 mg via INTRAVENOUS

## 2017-06-30 MED ORDER — FENTANYL CITRATE (PF) 100 MCG/2ML IJ SOLN
INTRAMUSCULAR | Status: AC
  Filled 2017-06-30: qty 2

## 2017-06-30 MED ORDER — SODIUM CHLORIDE 0.9 % IV SOLN
INTRAVENOUS | Status: DC
  Administered 2017-06-30: 16:00:00 via INTRAVENOUS

## 2017-06-30 NOTE — Progress Notes (Signed)
[employer] [facility] [place] [facility]                                                                                                                                                                                  Patient Demographics   Howard [name], is a 36 y.o. male, DOB - 28-Sep-1980, UJW:[id number]  Admit date - 06/28/2017   Admitting Physician Oralia Manis, MD  Outpatient Primary MD for the patient is [name], NP   LOS - 2  Subjective: He reports that his breathing is improved awaiting endoscopy   no further hematemesis  Review of Systems:   CONSTITUTIONAL: No documented fever. No fatigue, weakness. No weight gain, no weight loss.  EYES: No blurry or double vision.  ENT: No tinnitus. No postnasal drip. No redness of the oropharynx.  RESPIRATORY: No cough, no wheeze, no hemoptysis. Positive dyspnea.  CARDIOVASCULAR: No chest pain. No orthopnea. No palpitations. No syncope.  GASTROINTESTINAL: No nausea, no vomiting or diarrhea. No abdominal pain. No melena or hematochezia.  GENITOURINARY: No dysuria or hematuria.  ENDOCRINE: No polyuria or nocturia. No heat or cold intolerance.  HEMATOLOGY: No anemia. No bruising. No bleeding.  INTEGUMENTARY: No rashes. No lesions.  MUSCULOSKELETAL: No arthritis. No swelling. No gout.  NEUROLOGIC: No numbness, tingling, or ataxia. No seizure-type activity.  PSYCHIATRIC: No anxiety. No insomnia. No ADD.    Vitals:   Vitals:   06/30/17 0503 06/30/17 0734 06/30/17 1155 06/30/17 1342  BP: 138/88  140/80   Pulse: 71  72   Resp: 20     Temp: 97.6 F (36.4 C)  98.3 F (36.8 C)   TempSrc: Oral  Oral   SpO2: 92% 91% 92% 91%  Weight:      Height:        Wt Readings from Last 3 Encounters:  06/28/17 (!) 330 lb (149.7 kg)  06/26/17 (!) 337 lb 8 oz (153.1 kg)  06/23/17 (!) 334 lb 6.4 oz (151.7 kg)     Intake/Output Summary (Last 24 hours) at 06/30/17 1502 Last data filed at 06/30/17 1155  Gross per 24 hour   Intake           780.42 ml  Output              925 ml  Net          -144.58 ml    Physical Exam:   GENERAL: Pleasant-appearing in no apparent distress.  HEAD, EYES, EARS, NOSE AND THROAT: Atraumatic, normocephalic. Extraocular muscles are intact. Pupils equal and reactive to light. Sclerae anicteric. No conjunctival injection. No oro-pharyngeal erythema.  NECK: Supple. There is no jugular venous distention. No bruits,  no lymphadenopathy, no thyromegaly.  HEART: Regular rate and rhythm,. No murmurs, no rubs, no clicks.  LUNGS: Occasional wheezing bilaterally.  ABDOMEN: Soft, flat, nontender, nondistended. Has good bowel sounds. No hepatosplenomegaly appreciated.  EXTREMITIES: No evidence of any cyanosis, clubbing, or peripheral edema.  +2 pedal and radial pulses bilaterally.  NEUROLOGIC: Patient little sleepy but able to arouse SKIN: Moist and warm with no rashes appreciated.  Psych: Not anxious, depressed LN: No inguinal LN enlargement    Antibiotics   Anti-infectives    Start     Dose/Rate Route Frequency Ordered Stop   06/29/17 1000  rifaximin (XIFAXAN) tablet 550 mg     550 mg Oral 2 times daily 06/29/17 0015        Medications   Scheduled Meds: . cloZAPine  150 mg Oral QHS  . ferrous sulfate  325 mg Oral Q breakfast  . folic acid  1 mg Oral Daily  . insulin aspart  0-9 Units Subcutaneous Q6H  . ipratropium-albuterol  3 mL Nebulization Q6H  . lactulose  45 g Oral TID  . levETIRAcetam  1,500 mg Oral BID  . losartan  25 mg Oral Daily  . methylPREDNISolone (SOLU-MEDROL) injection  40 mg Intravenous Q12H  . multivitamin with minerals  1 tablet Oral Daily  . nadolol  20 mg Oral Daily  . pantoprazole (PROTONIX) IV  40 mg Intravenous Q12H  . rifaximin  550 mg Oral BID  . thiamine  100 mg Oral Daily   Or  . thiamine  100 mg Intravenous Daily   Continuous Infusions: . octreotide  (SANDOSTATIN)    IV infusion 50 mcg/hr (06/30/17 0635)   PRN Meds:.acetaminophen **OR**  acetaminophen, hydrOXYzine, LORazepam **OR** LORazepam, morphine CONCENTRATE, ondansetron **OR** ondansetron (ZOFRAN) IV, traZODone   Data Review:   Micro Results No results found for this or any previous visit (from the past 240 hour(s)).  Radiology Reports Dg Chest 2 View  Result Date: 06/29/2017 CLINICAL DATA:  Pt having SOB. Hx of AFIB, COPD, diabetes, hypertension. Non smoker EXAM: CHEST  2 VIEW COMPARISON:  05/03/2017 FINDINGS: Both views are degraded by patient body habitus. The frontal view is also degraded by AP portable technique. Increased retrocardiac density on the lateral view is favored to be artifactual, due to overlying soft tissues. Midline trachea. Borderline cardiomegaly. No pleural effusion or pneumothorax. Clear lungs. Apparent pulmonary interstitial prominence is favored to be due to AP portable technique. IMPRESSION: Decreased sensitivity and specificity exam due to technique related factors, as described above. Borderline cardiomegaly, without acute disease. Electronically Signed   By: Jeronimo Greaves M.D.   On: 06/29/2017 10:51   US Abdomen Limited  Addendum Date: 06/29/2017   ADDENDUM REPORT: 06/29/2017 16:03 ADDENDUM: As the limited abdominal ultrasound survey did not show evidence of ascites, paracentesis was not performed. Electronically Signed   By: Irish Lack M.D.   On: 06/29/2017 16:03   Result Date: 06/29/2017 CLINICAL DATA:  Inpatient. Hepatic cirrhosis. Abdominal distention and fever. EXAM: LIMITED ABDOMEN ULTRASOUND FOR ASCITES TECHNIQUE: Limited ultrasound survey for ascites was performed in all four abdominal quadrants. COMPARISON:  04/21/2017 CT abdomen/pelvis. FINDINGS: No evidence of ascites in any of the 4 quadrants of the abdomen or in the central abdomen. Representative image of the liver demonstrates coarsened liver echotexture and irregular liver contour compatible with known cirrhosis. IMPRESSION: Cirrhosis.  No evidence of ascites. Electronically  Signed: By: Delbert Phenix M.D. On: 06/29/2017 15:40     CBC  Recent Labs Lab 06/28/17 1938 06/29/17  0348  WBC 6.3 6.1  HGB 10.9* 10.4*  HCT 32.1* 31.1*  PLT 117* 103*  MCV 90.8 91.0  MCH 30.7 30.3  MCHC 33.8 33.3  RDW 14.9* 15.7*  LYMPHSABS  --  1.0  MONOABS  --  0.6  EOSABS  --  0.3  BASOSABS  --  0.0    Chemistries   Recent Labs Lab 06/28/17 1938 06/29/17 0348  NA 138 142  K 3.8 4.2  CL 107 111  CO2 25 27  GLUCOSE 193* 135*  BUN 12 11  CREATININE 0.82 0.82  CALCIUM 8.8* 8.4*  AST 36  --   ALT 34  --   ALKPHOS 108  --   BILITOT 0.6  --    ------------------------------------------------------------------------------------------------------------------ estimated creatinine clearance is 191.1 mL/min (by C-G formula based on SCr of 0.82 mg/dL). ------------------------------------------------------------------------------------------------------------------ No results for input(s): HGBA1C in the last 72 hours. ------------------------------------------------------------------------------------------------------------------ No results for input(s): CHOL, HDL, LDLCALC, TRIG, CHOLHDL, LDLDIRECT in the last 72 hours. ------------------------------------------------------------------------------------------------------------------ No results for input(s): TSH, T4TOTAL, T3FREE, THYROIDAB in the last 72 hours.  Invalid input(s): FREET3 ------------------------------------------------------------------------------------------------------------------  Recent Labs  06/29/17 1507  VITAMINB12 395    Coagulation profile  Recent Labs Lab 06/28/17 1938  INR 1.18    No results for input(s): DDIMER in the last 72 hours.  Cardiac Enzymes No results for input(s): CKMB, TROPONINI, MYOGLOBIN in the last 168 hours.  Invalid input(s): CK ------------------------------------------------------------------------------------------------------------------ Invalid input(s):  POCBNP    Assessment & Plan  Patient is well known to our service with multiple admissions to the hospital 1. Hematemesis -continue IV octreotide, twice a day IV pantoprazole,  Hemoglobin is stable EGD today 2. Acute COPD exasperation chest x-ray is inconclusive Continued therapy with nebs steroids , symptoms improved  3.  Esophageal varices (HCC) - treatment as above, continue home meds EGD today  4. Hepatic encephalopathy  continue  lactulose and Xifaxan     5.Paranoid schizophrenia (HCC) - continue home medications 6.   Alcoholic cirrhosis of liver with ascites (HCC) -continuing to drink, per GI may have positive AMA suggestive of primary biliary cholangitis  7.  Controlled type 2 diabetes mellitus without complication (HCC) - sliding scale insulin with corresponding glucose checks 8.  Essential (primary) hypertension - continue home meds     Code Status Orders        Start     Ordered   06/29/17 0016  Full code  Continuous     06/29/17 0015    Code Status History    Date Active Date Inactive Code Status Order ID Comments User Context   06/09/2017 11:43 AM 06/16/2017  6:04 PM Full Code [id number]  Audery Amel, MD Inpatient   04/19/2017  4:08 PM 04/20/2017  4:18 PM Full Code [id number]  Gracelyn Nurse, MD Inpatient   04/14/2017  7:47 PM 04/16/2017  5:38 PM Full Code [id number]  Alford Highland, MD ED   04/06/2017  2:17 AM 04/07/2017  4:14 PM Full Code [id number]  Oralia Manis, MD Inpatient   03/26/2017  8:25 AM 03/27/2017  5:25 PM Full Code [id number]  Ihor Austin, MD Inpatient   01/18/2017  4:35 PM 01/19/2017  3:16 PM Full Code [id number]  Marguarite Arbour, MD Inpatient   01/05/2017  2:57 PM 01/05/2017  9:46 PM Full Code [id number]  Arnaldo Natal, MD ED   12/30/2016  2:51 AM 01/01/2017  2:33 PM Full Code [id number]  Oralia Manis, MD Inpatient   12/18/2016 12:25 AM 12/19/2016  9:28  PM Full Code [id number]  Katharina Caper, MD Inpatient   12/12/2016 11:40 PM 12/15/2016  8:28 PM Full Code  [id number]  Arnaldo Natal, MD Inpatient   11/24/2016  1:11 AM 11/25/2016  4:42 PM Full Code [id number]  Hugelmeyer, Jon Gills, DO Inpatient   11/12/2016  3:36 PM 11/14/2016  2:36 PM Full Code [id number]  Auburn Bilberry, MD Inpatient   10/04/2016  6:04 PM 10/08/2016  3:44 PM Full Code [id number]  Shaune Pollack, MD Inpatient   09/22/2016  2:29 AM 09/23/2016  6:38 PM Full Code [id number]  Oralia Manis, MD Inpatient   08/07/2016  7:37 PM 08/12/2016  8:51 PM Full Code [id number]  Gracelyn Nurse, MD Inpatient   07/07/2016  1:40 AM 07/09/2016  6:29 PM Full Code [id number]  Ihor Austin, MD Inpatient   06/10/2016  6:21 AM 06/11/2016  8:25 PM Full Code [id number]  Ihor Austin, MD Inpatient   05/12/2016  3:55 AM 05/15/2016  2:57 PM Full Code [id number]  Ihor Austin, MD Inpatient   04/28/2016  2:54 AM 04/29/2016  6:23 PM Full Code [id number]  Hugelmeyer, Alexis, DO Inpatient   03/14/2016 12:30 PM 03/16/2016  3:41 PM Full Code [id number]  Shaune Pollack, MD Inpatient   03/03/2016 12:13 PM 03/06/2016  2:26 PM Full Code [id number]  Milagros Loll, MD ED   02/10/2016  5:54 PM 02/12/2016  7:34 PM Full Code [id number]  Ramonita Lab, MD Inpatient   11/11/2015  5:16 AM 11/13/2015  6:00 PM Full Code [id number]  Ihor Austin, MD Inpatient   11/07/2015 10:02 PM 11/09/2015  4:56 PM Full Code [id number]  Enid Baas, MD Inpatient   10/28/2015  8:14 PM 10/29/2015  2:26 PM Full Code [id number]  Altamese Dilling, MD Inpatient   10/16/2015 11:47 PM 10/17/2015  6:00 PM Full Code [id number]  Oralia Manis, MD Inpatient   07/30/2015  5:35 PM 08/04/2015  8:34 PM Full Code [id number]  Enedina Finner, MD Inpatient   07/05/2015  1:16 AM 07/08/2015  7:22 PM Full Code [id number]  Oralia Manis, MD Inpatient   05/30/2015  7:28 PM 06/01/2015  3:36 PM Full Code [id number]  [name], [name], MD Inpatient    Advance Directive Documentation     Most Recent Value  Type of Advance Directive  Out of facility DNR (pink MOST or yellow form)  Pre-existing out of facility DNR order  (yellow form or pink MOST form)  -  "MOST" Form in Place?  -           Consults gi  DVT Prophylaxis  SCDs  Lab Results  Component Value Date   PLT 103 (L) 06/29/2017     Time Spent in minutes   Greater than 50% of time spent in care coordination and counseling patient regarding the condition and plan of care.   Auburn Bilberry M.D on 06/30/2017 at 3:02 PM  Between 7am to 6pm - Pager - [phone]  After 6pm go to [email or web address] - password [email or web address] [facility]  [facility]   Office  [phone]

## 2017-06-30 NOTE — Care Management (Signed)
This morning patient's insurance was listed as [employer] which was not correct.  It is now listed at primary insurance Medicaid.  Patient has a Medicare Card scanned in on 06/23/17.  Follow up e-mail sent to [facility] to update insurance information

## 2017-06-30 NOTE — Progress Notes (Signed)
Please note patient is currently followed by out patient Palliative. CMRN Stephanie Bowen made aware. TBevelyn Ngoyna Barker RN, BSN, [facility] [place], [facility] [phone] c

## 2017-06-30 NOTE — Anesthesia Preprocedure Evaluation (Signed)
Anesthesia Evaluation  Patient identified by MRN, date of birth, ID band Patient awake    Reviewed: Allergy & Precautions, H&P , NPO status , Patient's Chart, lab work & pertinent test results, reviewed documented beta blocker date and time   History of Anesthesia Complications Negative for: history of anesthetic complications  Airway Mallampati: IV  TM Distance: >3 FB Neck ROM: full    Dental  (+) Chipped, Missing, Poor Dentition, Dental Advidsory Given   Pulmonary shortness of breath, sleep apnea and Oxygen sleep apnea , COPD, neg recent URI,           Cardiovascular Exercise Tolerance: Good hypertension, On Medications + angina (relieved with antacids) (-) CAD, (-) Past MI, (-) Cardiac Stents and (-) CABG + dysrhythmias + Valvular Problems/Murmurs      Neuro/Psych Seizures -,  PSYCHIATRIC DISORDERS (Schizophrenia)    GI/Hepatic GERD  ,(+) Cirrhosis   Esophageal Varices and ascites  substance abuse  alcohol use,   Endo/Other  diabetesMorbid obesity  Renal/GU negative Renal ROS  negative genitourinary   Musculoskeletal   Abdominal   Peds  Hematology  (+) Blood dyscrasia, anemia ,   Anesthesia Other Findings Past Medical History: No date: Alcoholic cirrhosis of liver with ascites (HCC) No date: Alcoholism (HCC) No date: Anemia No date: Atrial fibrillation (HCC) No date: COPD (chronic obstructive pulmonary disease) (HCC) No date: Depression No date: Diabetes mellitus, type II (HCC) No date: Esophageal varices (HCC) No date: Heart disease     Comment:  irregular heart beat (palpitations) and heart murmur No date: Hyperlipemia No date: Hypertension No date: Liver disease No date: Multiple thyroid nodules No date: Portal hypertensive gastropathy (HCC) No date: Schizophrenia (HCC) No date: Seizures (HCC)   Reproductive/Obstetrics negative OB ROS                              Anesthesia Physical  Anesthesia Plan  ASA: III  Anesthesia Plan: General   Post-op Pain Management:    Induction: Intravenous  PONV Risk Score and Plan: 2 and Ondansetron and Dexamethasone  Airway Management Planned: Nasal Cannula  Additional Equipment:   Intra-op Plan:   Post-operative Plan:   Informed Consent: I have reviewed the patients History and Physical, chart, labs and discussed the procedure including the risks, benefits and alternatives for the proposed anesthesia with the patient or authorized representative who has indicated his/her understanding and acceptance.   Dental Advisory Given  Plan Discussed with: Anesthesiologist, CRNA and Surgeon  Anesthesia Plan Comments:         Anesthesia Quick Evaluation

## 2017-06-30 NOTE — Transfer of Care (Signed)
Immediate Anesthesia Transfer of Care Note  Patient: Howard [name]  Procedure(s) Performed: ESOPHAGOGASTRODUODENOSCOPY (EGD) (N/A )  Patient Location: PACU and Endoscopy Unit  Anesthesia Type:General  Level of Consciousness: sedated and responds to stimulation  Airway & Oxygen Therapy: Patient Spontanous Breathing and Patient connected to nasal cannula oxygen  Post-op Assessment: Report given to RN and Post -op Vital signs reviewed and stable  Post vital signs: Reviewed and stable  Last Vitals:  Vitals:   06/30/17 1622 06/30/17 1753  BP:  (!) 148/89  Pulse:  76  Resp:  14  Temp:  (!) 36.2 C  SpO2: 92% 91%    Last Pain:  Vitals:   06/30/17 1613  TempSrc: Tympanic  PainSc:       Patients Stated Pain Goal: 0 (06/29/17 0030)  Complications: No apparent anesthesia complications

## 2017-06-30 NOTE — OR Nursing (Signed)
Report to floor nurse..

## 2017-06-30 NOTE — Progress Notes (Signed)
PT Cancellation Note  Patient Details Name: Howard [name] MRN: [id number] DOB: 1981-09-10   Cancelled Treatment:    Reason Eval/Treat Not Completed: Patient at procedure or test/unavailable  Malachi Pro, DPT 06/30/2017, 4:41 PM

## 2017-06-30 NOTE — Anesthesia Post-op Follow-up Note (Signed)
Anesthesia QCDR form completed.        

## 2017-06-30 NOTE — Op Note (Signed)
[facility] Patient Name: Howard [name] Procedure Date: 06/30/2017 4:44 PM MRN: [id number] Account #: [id number] Date of Birth: [birth year] Admit Type: Inpatient Age: 36 Room: [facility] ENDO ROOM 1 Gender: Male Note Status: Finalized Procedure:            Upper GI endoscopy Indications:          Hematemesis Providers:            Lin Landsman MD, MD Medicines:            Monitored Anesthesia Care Complications:        No immediate complications. Estimated blood loss: None. Procedure:            Pre-Anesthesia Assessment:                       - Prior to the procedure, a History and Physical was                        performed, and patient medications and allergies were                        reviewed. The patient is competent. The risks and                        benefits of the procedure and the sedation options and                        risks were discussed with the patient. All questions                        were answered and informed consent was obtained.                        Patient identification and proposed procedure were                        verified by the physician, the nurse, the                        anesthesiologist, the anesthetist and the technician in                        the pre-procedure area in the procedure room. Mental                        Status Examination: alert and oriented. Airway                        Examination: normal oropharyngeal airway and neck                        mobility and small/crowded oropharyngeal airway.                        Respiratory Examination: clear to auscultation. CV                        Examination: normal. Prophylactic Antibiotics: The                        patient does not  require prophylactic antibiotics.                        Prior Anticoagulants: The patient has taken no previous                        anticoagulant or antiplatelet agents. ASA Grade   Assessment: III - A patient with severe systemic                        disease. After reviewing the risks and benefits, the                        patient was deemed in satisfactory condition to undergo                        the procedure. The anesthesia plan was to use monitored                        anesthesia care (MAC). Immediately prior to                        administration of medications, the patient was                        re-assessed for adequacy to receive sedatives. The                        heart rate, respiratory rate, oxygen saturations, blood                        pressure, adequacy of pulmonary ventilation, and                        response to care were monitored throughout the                        procedure. The physical status of the patient was                        re-assessed after the procedure.                       After obtaining informed consent, the endoscope was                        passed under direct vision. Throughout the procedure,                        the patient's blood pressure, pulse, and oxygen                        saturations were monitored continuously. The Endoscope                        was introduced through the mouth, and advanced to the                        second part of duodenum. The upper GI endoscopy was  accomplished without difficulty. The patient tolerated                        the procedure well. Findings:      The duodenal bulb and second portion of the duodenum were normal.      The entire examined stomach was normal, prominent gastric folds in       fundus, no gastric varices seen.      The cardia and gastric fundus were normal on retroflexion.      Small (< 5 mm) varices were found in the upper third of the esophagus       and in the middle third of the esophagus. They were small in size. Impression:           - Normal duodenal bulb and second portion of the                         duodenum.                       - Normal stomach, no fresh or old blood seen                       - Small (< 5 mm) esophageal varices, no stigmata of                        recent bleeding.                       - No specimens collected. Recommendation:       - Return patient to hospital ward for ongoing care.                       - Advance diet as tolerated today.                       - Continue present medications.                       - Continue nadolol for secondary variceal prophylaxis                       - Discontinue octreotide drip Procedure Code(s):    --- Professional ---                       [phone], Esophagogastroduodenoscopy, flexible, transoral;                        diagnostic, including collection of specimen(s) by                        brushing or washing, when performed (separate procedure) Diagnosis Code(s):    --- Professional ---                       I85.00, Esophageal varices without bleeding                       K92.0, Hematemesis CPT copyright 2016 American Medical Association. All rights reserved. The codes documented in this report are preliminary and upon coder review may  be revised to meet current compliance requirements. Dr. Ulyess Mort  Raeanne Gathers MD,  MD 06/30/2017 6:06:27 PM This report has been signed electronically. Number of Addenda: 0 Note Initiated On: 06/30/2017 4:44 PM      [facility]

## 2017-06-30 NOTE — Anesthesia Postprocedure Evaluation (Signed)
Anesthesia Post Note  Patient: Howard [name]  Procedure(s) Performed: ESOPHAGOGASTRODUODENOSCOPY (EGD) (N/A )  Patient location during evaluation: Endoscopy Anesthesia Type: General Level of consciousness: awake and alert Pain management: pain level controlled Vital Signs Assessment: post-procedure vital signs reviewed and stable Respiratory status: spontaneous breathing, nonlabored ventilation, respiratory function stable and patient connected to nasal cannula oxygen Cardiovascular status: blood pressure returned to baseline and stable Postop Assessment: no apparent nausea or vomiting Anesthetic complications: no     Last Vitals:  Vitals:   06/30/17 1813 06/30/17 1825  BP: (!) 147/97 (!) 154/100  Pulse: 74 74  Resp: 14   Temp:    SpO2: 92% 93%    Last Pain:  Vitals:   06/30/17 1613  TempSrc: Tympanic  PainSc:                  Lenard Simmer

## 2017-06-30 NOTE — Progress Notes (Addendum)
EGD post procedure note:  Findings: - Normal duodenal bulb and second portion of the duodenum.  - Normal stomach, no fresh or old blood seen, no gastric varices - Small (< 5 mm) esophageal varices, no stigmata of recent bleeding.   Recs: - Return patient to hospital ward for ongoing care.  - Advance diet as tolerated today.  - Continue present medications.  - Continue nadolol for secondary variceal prophylaxis - Discontinue octreotide drip - Protonix only once daily - Repeat AMA came back negative - Follow with Dr Markham Jordan after discharge  Will sign off, plz call back with questions  Arlyss Repress, MD [address]  [address]  [place], [place] [address]  Main: [phone]  Fax: [phone] Pager: [phone]

## 2017-07-01 ENCOUNTER — Encounter: Payer: Self-pay | Admitting: Gastroenterology

## 2017-07-01 LAB — CBC
HEMATOCRIT: 33.2 % — AB (ref 40.0–52.0)
HEMOGLOBIN: 11.3 g/dL — AB (ref 13.0–18.0)
MCH: 31.1 pg (ref 26.0–34.0)
MCHC: 34 g/dL (ref 32.0–36.0)
MCV: 91.4 fL (ref 80.0–100.0)
Platelets: 122 10*3/uL — ABNORMAL LOW (ref 150–440)
RBC: 3.63 MIL/uL — AB (ref 4.40–5.90)
RDW: 14.8 % — ABNORMAL HIGH (ref 11.5–14.5)
WBC: 10.3 10*3/uL (ref 3.8–10.6)

## 2017-07-01 LAB — GLUCOSE, CAPILLARY
GLUCOSE-CAPILLARY: 148 mg/dL — AB (ref 65–99)
Glucose-Capillary: 135 mg/dL — ABNORMAL HIGH (ref 65–99)

## 2017-07-01 MED ORDER — IPRATROPIUM-ALBUTEROL 0.5-2.5 (3) MG/3ML IN SOLN: 3.0000 mL | Freq: Four times a day (QID) | RESPIRATORY_TRACT | 1 refills | Status: AC

## 2017-07-01 NOTE — Discharge Summary (Signed)
[facility] [place] at [name], 36 y.o., DOB Aug 29, 1981, MRN [id number]. Admission date: 06/28/2017 Discharge Date 07/01/2017 Primary MD [name], NP Admitting Physician Oralia Manis, MD  Admission Diagnosis  Alcohol abuse [F10.10] UGIB (upper gastrointestinal bleed) [K92.2] Acute alcoholic gastritis, presence of bleeding unspecified [K29.20] Alcoholic cirrhosis, unspecified whether ascites present [facility]) [K70.30]  Discharge Diagnosis   Principal Problem:   Hematemesis source unidentified EGD shows some varices   Acute on chronic COPD exasperation   Esophageal varices   Hepatic encephalopathy chronic in nature   Paranoid schizophrenia (HCC)   Alcoholic cirrhosis of liver with ascites (HCC)   Controlled type 2 diabetes mellitus without complication (HCC)   Essential (primary) hypertension          Hospital Course  Howard [name]  is a 37 y.o. male who presents with Report of hematemesis yesterday and today. Patient states that he had bloody emesis 3-4 times yesterday, as well as 2-3 times today. Hasn't had presented with similar type presentation and past and no pathology is found. Patient was admitted again with the same. Hemoglobin remained stable. Underwent EGD which showed small varices but no active bleeding was noted. Patient's hemoglobin is remaining stable. He has not complained of any further hematemesis. Patient also complained of shortness of breath he has history of COPD he was treated for COPD exasperation. Patient's breathing is much improved. He is feeling back to baseline and stable for discharge.           Consults  GI  Significant Tests:  See full reports for all details     Dg Chest 2 View  Result Date: 06/29/2017 CLINICAL DATA:  Pt having SOB. Hx of AFIB, COPD, diabetes, hypertension. Non smoker EXAM: CHEST  2 VIEW COMPARISON:  05/03/2017 FINDINGS: Both views are degraded by patient body habitus. The  frontal view is also degraded by AP portable technique. Increased retrocardiac density on the lateral view is favored to be artifactual, due to overlying soft tissues. Midline trachea. Borderline cardiomegaly. No pleural effusion or pneumothorax. Clear lungs. Apparent pulmonary interstitial prominence is favored to be due to AP portable technique. IMPRESSION: Decreased sensitivity and specificity exam due to technique related factors, as described above. Borderline cardiomegaly, without acute disease. Electronically Signed   By: Jeronimo Greaves M.D.   On: 06/29/2017 10:51   US Abdomen Limited  Addendum Date: 06/29/2017   ADDENDUM REPORT: 06/29/2017 16:03 ADDENDUM: As the limited abdominal ultrasound survey did not show evidence of ascites, paracentesis was not performed. Electronically Signed   By: Irish Lack M.D.   On: 06/29/2017 16:03   Result Date: 06/29/2017 CLINICAL DATA:  Inpatient. Hepatic cirrhosis. Abdominal distention and fever. EXAM: LIMITED ABDOMEN ULTRASOUND FOR ASCITES TECHNIQUE: Limited ultrasound survey for ascites was performed in all four abdominal quadrants. COMPARISON:  04/21/2017 CT abdomen/pelvis. FINDINGS: No evidence of ascites in any of the 4 quadrants of the abdomen or in the central abdomen. Representative image of the liver demonstrates coarsened liver echotexture and irregular liver contour compatible with known cirrhosis. IMPRESSION: Cirrhosis.  No evidence of ascites. Electronically Signed: By: Delbert Phenix M.D. On: 06/29/2017 15:40       Today   Subjective:   Howard [name]  patient feeling much better shortness of breath improved  Objective:   Blood pressure (!) 142/86, pulse 65, temperature 97.8 F (36.6 C), temperature source Oral, resp. rate 18, height  (1.905 m), weight (!) 330 lb (149.7 kg), SpO2 94 %.  Marland Kitchen  Intake/Output Summary (Last 24 hours) at 07/01/17 1517 Last data filed at 07/01/17 0818  Gross per 24 hour  Intake           776.25 ml  Output               725 ml  Net            51.25 ml    Exam VITAL SIGNS: Blood pressure (!) 142/86, pulse 65, temperature 97.8 F (36.6 C), temperature source Oral, resp. rate 18, height  (1.905 m), weight (!) 330 lb (149.7 kg), SpO2 94 %.  GENERAL:  36 y.o.-year-old patient lying in the bed with no acute distress.  EYES: Pupils equal, round, reactive to light and accommodation. No scleral icterus. Extraocular muscles intact.  HEENT: Head atraumatic, normocephalic. Oropharynx and nasopharynx clear.  NECK:  Supple, no jugular venous distention. No thyroid enlargement, no tenderness.  LUNGS: Normal breath sounds bilaterally, no wheezing, rales,rhonchi or crepitation. No use of accessory muscles of respiration.  CARDIOVASCULAR: S1, S2 normal. No murmurs, rubs, or gallops.  ABDOMEN: Soft, nontender, nondistended. Bowel sounds present. No organomegaly or mass.  EXTREMITIES: No pedal edema, cyanosis, or clubbing.  NEUROLOGIC: Cranial nerves II through XII are intact. Muscle strength 5/5 in all extremities. Sensation intact. Gait not checked.  PSYCHIATRIC: The patient is alert and oriented x 3.  SKIN: No obvious rash, lesion, or ulcer.   Data Review     CBC w Diff: Lab Results  Component Value Date   WBC 10.3 07/01/2017   HGB 11.3 (L) 07/01/2017   HGB 14.4 12/25/2014   HCT 33.2 (L) 07/01/2017   HCT 44.7 12/25/2014   PLT 122 (L) 07/01/2017   PLT 155 12/25/2014   LYMPHOPCT 17 06/29/2017   MONOPCT 9 06/29/2017   EOSPCT 4 06/29/2017   BASOPCT 0 06/29/2017   CMP: Lab Results  Component Value Date   NA 142 06/29/2017   NA 141 12/25/2014   K 4.2 06/29/2017   K 3.4 (L) 12/25/2014   CL 111 06/29/2017   CL 100 (L) 12/25/2014   CO2 27 06/29/2017   CO2 30 12/25/2014   BUN 11 06/29/2017   BUN 15 12/25/2014   CREATININE 0.82 06/29/2017   CREATININE 1.23 12/25/2014   PROT 7.4 06/28/2017   PROT 8.5 (H) 12/25/2014   ALBUMIN 3.4 (L) 06/28/2017   ALBUMIN 4.1 12/25/2014   BILITOT 0.6  06/28/2017   BILITOT 0.9 12/25/2014   ALKPHOS 108 06/28/2017   ALKPHOS 148 (H) 12/25/2014   AST 36 06/28/2017   AST 146 (H) 12/25/2014   ALT 34 06/28/2017   ALT 107 (H) 12/25/2014  .  Micro Results No results found for this or any previous visit (from the past 240 hour(s)).      Code Status Orders        Start     Ordered   06/29/17 0016  Full code  Continuous     06/29/17 0015    Code Status History    Date Active Date Inactive Code Status Order ID Comments User Context   06/09/2017 11:43 AM 06/16/2017  6:04 PM Full Code [id number]  Audery Amel, MD Inpatient   04/19/2017  4:08 PM 04/20/2017  4:18 PM Full Code [id number]  Gracelyn Nurse, MD Inpatient   04/14/2017  7:47 PM 04/16/2017  5:38 PM Full Code [id number]  Alford Highland, MD ED   04/06/2017  2:17 AM 04/07/2017  4:14 PM Full Code [id number]  Oralia Manis, MD  Inpatient   03/26/2017  8:25 AM 03/27/2017  5:25 PM Full Code [id number]  Ihor Austin, MD Inpatient   01/18/2017  4:35 PM 01/19/2017  3:16 PM Full Code [id number]  Marguarite Arbour, MD Inpatient   01/05/2017  2:57 PM 01/05/2017  9:46 PM Full Code [id number]  Arnaldo Natal, MD ED   12/30/2016  2:51 AM 01/01/2017  2:33 PM Full Code [id number]  Oralia Manis, MD Inpatient   12/18/2016 12:25 AM 12/19/2016  9:28 PM Full Code [id number]  Katharina Caper, MD Inpatient   12/12/2016 11:40 PM 12/15/2016  8:28 PM Full Code [id number]  Arnaldo Natal, MD Inpatient   11/24/2016  1:11 AM 11/25/2016  4:42 PM Full Code [id number]  Hugelmeyer, Alexis, DO Inpatient   11/12/2016  3:36 PM 11/14/2016  2:36 PM Full Code [id number]  Auburn Bilberry, MD Inpatient   10/04/2016  6:04 PM 10/08/2016  3:44 PM Full Code [id number]  Shaune Pollack, MD Inpatient   09/22/2016  2:29 AM 09/23/2016  6:38 PM Full Code [id number]  Oralia Manis, MD Inpatient   08/07/2016  7:37 PM 08/12/2016  8:51 PM Full Code [id number]  Gracelyn Nurse, MD Inpatient   07/07/2016  1:40 AM 07/09/2016  6:29 PM Full Code [id number]  Ihor Austin, MD  Inpatient   06/10/2016  6:21 AM 06/11/2016  8:25 PM Full Code [id number]  Ihor Austin, MD Inpatient   05/12/2016  3:55 AM 05/15/2016  2:57 PM Full Code [id number]  Ihor Austin, MD Inpatient   04/28/2016  2:54 AM 04/29/2016  6:23 PM Full Code [id number]  Hugelmeyer, Alexis, DO Inpatient   03/14/2016 12:30 PM 03/16/2016  3:41 PM Full Code [id number]  Shaune Pollack, MD Inpatient   03/03/2016 12:13 PM 03/06/2016  2:26 PM Full Code [id number]  Milagros Loll, MD ED   02/10/2016  5:54 PM 02/12/2016  7:34 PM Full Code [id number]  Ramonita Lab, MD Inpatient   11/11/2015  5:16 AM 11/13/2015  6:00 PM Full Code [id number]  Ihor Austin, MD Inpatient   11/07/2015 10:02 PM 11/09/2015  4:56 PM Full Code [id number]  Enid Baas, MD Inpatient   10/28/2015  8:14 PM 10/29/2015  2:26 PM Full Code [id number]  Altamese Dilling, MD Inpatient   10/16/2015 11:47 PM 10/17/2015  6:00 PM Full Code [id number]  Oralia Manis, MD Inpatient   07/30/2015  5:35 PM 08/04/2015  8:34 PM Full Code [id number]  Enedina Finner, MD Inpatient   07/05/2015  1:16 AM 07/08/2015  7:22 PM Full Code [id number]  Oralia Manis, MD Inpatient   05/30/2015  7:28 PM 06/01/2015  3:36 PM Full Code [id number]  [name], [name], MD Inpatient    Advance Directive Documentation     Most Recent Value  Type of Advance Directive  Out of facility DNR (pink MOST or yellow form)  Pre-existing out of facility DNR order (yellow form or pink MOST form)  -  "MOST" Form in Place?  -          Follow-up Information    [name], NP. Go on 07/06/2017.   Specialty:  Nurse Practitioner Why:  Monday at 1:40pm for Hospital Follow-up Contact information: [address] [place] [address] [phone]           Discharge Medications   Allergies as of 07/01/2017      Reactions   Tramadol Itching      Medication List    TAKE these medications   albuterol 108 (90 Base) MCG/ACT inhaler Commonly known as:  PROVENTIL  HFA;VENTOLIN HFA Inhale 2 puffs into the lungs  every 4 (four) hours as needed for wheezing or shortness of breath.   clozapine 50 MG tablet Commonly known as:  CLOZARIL Take 3 tablets (150 mg total) by mouth at bedtime.   ferrous sulfate 325 (65 FE) MG tablet Take 1 tablet (325 mg total) by mouth daily with breakfast.   folic acid 1 MG tablet Commonly known as:  FOLVITE Take 1 tablet (1 mg total) by mouth daily.   hydrOXYzine 25 MG tablet Commonly known as:  ATARAX/VISTARIL Take 1 tablet (25 mg total) by mouth 3 (three) times daily as needed for anxiety.   ipratropium-albuterol 0.5-2.5 (3) MG/3ML Soln Commonly known as:  DUONEB Take 3 mLs by nebulization every 6 (six) hours.   lactulose 10 GM/15ML solution Commonly known as:  CHRONULAC Take 67.5 mLs (45 g total) by mouth 3 (three) times daily.   levETIRAcetam 750 MG tablet Commonly known as:  KEPPRA Take 2 tablets (1,500 mg total) by mouth 2 (two) times daily.   LORazepam 0.5 MG tablet Commonly known as:  ATIVAN Take 0.5 mg by mouth every 4 (four) hours as needed for anxiety.   losartan 25 MG tablet Commonly known as:  COZAAR Take 1 tablet (25 mg total) by mouth daily.   metFORMIN 500 MG tablet Commonly known as:  GLUCOPHAGE Take 1 tablet (500 mg total) by mouth 2 (two) times daily with a meal.   morphine CONCENTRATE 10 mg / 0.5 ml concentrated solution Take 0.5 mg by mouth every 2 (two) hours as needed for severe pain or shortness of breath.   nadolol 20 MG tablet Commonly known as:  CORGARD Take 1 tablet (20 mg total) by mouth daily.   pantoprazole 40 MG tablet Commonly known as:  PROTONIX Take 1 tablet (40 mg total) by mouth daily.   prochlorperazine 10 MG tablet Commonly known as:  COMPAZINE Take 10 mg by mouth every 6 (six) hours as needed for nausea or vomiting (or migraine).   rifaximin 550 MG Tabs tablet Commonly known as:  XIFAXAN Take 1 tablet (550 mg total) by mouth 2 (two) times daily.   sucralfate 1 g tablet Commonly known as:   CARAFATE Take 1 tablet (1 g total) by mouth 4 (four) times daily -  with meals and at bedtime.   thiamine 100 MG tablet Take 1 tablet (100 mg total) by mouth daily.   traZODone 100 MG tablet Commonly known as:  DESYREL Take 100 mg by mouth at bedtime as needed for sleep.            Durable Medical Equipment        Start     Ordered   07/01/17 0914  For home use only DME Nebulizer machine  Once    Question:  Patient needs a nebulizer to treat with the following condition  Answer:  COPD (chronic obstructive pulmonary disease) (HCC)   07/01/17 0913         Total Time in preparing paper work, data evaluation and todays exam - 35 minutes  Auburn Bilberry M.D on 07/01/2017 at 3:17 PM  [facility]   Office  [phone]

## 2017-07-01 NOTE — Progress Notes (Signed)
Physical Therapy Treatment Patient Details Name: Howard [name] MRN: [id number] DOB: 1980/11/30 Today's Date: 07/01/2017    History of Present Illness Howard [name] is a 36 y.o. male who presents with report of hematemesis yesterday and today. Patient states that he had bloody emesis 3-4 times yesterday, as well as 2-3 times today. He has a history of cirrhosis and known varices. His hemoglobin is stable in the ED today. Hospitalists were called for admission. He is currently admitted for hematemesis and acute COPD exacerbation. Pt reports that he is currently being followed by Hospice for the last month. Notes from Hospice representative clarify that he is being followed by palliative care    PT Comments    Pt eager to work with therapy today. He is able to perform 2 laps around RN station with therapist. SaO2 drops to around 90% on 2L/min supplemental O2. Pt requires seated rest break between laps around RN station. Pt with some infrequent LE buckling but able to stabilize with UE support on rolling walker. Fatigue and SOB continuously monitored. He is able to complete seated and standing exercises but with increase in fatigue. Pt will benefit from PT services to address deficits in strength, balance, and mobility in order to return to full function at home.    Follow Up Recommendations  Home health PT     Equipment Recommendations  None recommended by PT    Recommendations for Other Services       Precautions / Restrictions Precautions Precautions: Fall Restrictions Weight Bearing Restrictions: No    Mobility  Bed Mobility Overal bed mobility: Modified Independent             General bed mobility comments: Pt with improved speed and sequencing today  Transfers Overall transfer level: Needs assistance Equipment used: Rolling walker (2 wheeled) Transfers: Sit to/from Stand Sit to Stand: Modified independent (Device/Increase time)         General transfer comment:  Pt demonstrates improved sequencing today. Fair stability once upright in standing but pt does have one stumble that he is able to correct with UE support on rolling walker  Ambulation/Gait Ambulation/Gait assistance: Min guard Ambulation Distance (Feet): 350 Feet (175+175) Assistive device: Rolling walker (2 wheeled) Gait Pattern/deviations: Decreased step length - right;Decreased step length - left Gait velocity: Decreased Gait velocity interpretation: <1.8 ft/sec, indicative of risk for recurrent falls General Gait Details: Pt able to perform 2 laps around RN station with therapist. SaO2 drops to around 90% on 2L/min supplemental O2. Pt requires seated rest break between laps around RN station. Pt with some infrequent LE buckling but able to stabilize with UE support on rolling walker. Fatigue and SOB continuously monitored   Stairs            Wheelchair Mobility    Modified Rankin (Stroke Patients Only)       Balance Overall balance assessment: Needs assistance Sitting-balance support: No upper extremity supported Sitting balance-Leahy Scale: Good     Standing balance support: No upper extremity supported Standing balance-Leahy Scale: Fair                              Cognition Arousal/Alertness: Awake/alert Behavior During Therapy: WFL for tasks assessed/performed Overall Cognitive Status: History of cognitive impairments - at baseline  General Comments: AOx2, partially oriented to date and situation      Exercises General Exercises - Lower Extremity Long Arc Quad: Strengthening;Both;Seated;15 reps Heel Slides: Strengthening;Both;Seated;15 reps Hip ABduction/ADduction: Strengthening;Both;Seated;15 reps Hip Flexion/Marching: Strengthening;Both;Seated;15 reps;Other (comment) (Performed x 15 in standing ) Heel Raises: Strengthening;Both;Seated;15 reps Mini-Sqauts: Strengthening;Both;10 reps;Standing     General Comments        Pertinent Vitals/Pain Pain Assessment: 0-10 Pain Location: Low back, "a little pain". Doesn't rate on NPRS Pain Intervention(s): Monitored during session;Premedicated before session    Home Living                      Prior Function            PT Goals (current goals can now be found in the care plan section) Acute Rehab PT Goals Patient Stated Goal: Return to prior function at home PT Goal Formulation: With patient Time For Goal Achievement: 07/13/17 Potential to Achieve Goals: Good Progress towards PT goals: Progressing toward goals    Frequency    Min 2X/week      PT Plan Current plan remains appropriate    Co-evaluation              AM-PAC PT "6 Clicks" Daily Activity  Outcome Measure  Difficulty turning over in bed (including adjusting bedclothes, sheets and blankets)?: None Difficulty moving from lying on back to sitting on the side of the bed? : None Difficulty sitting down on and standing up from a chair with arms (e.g., wheelchair, bedside commode, etc,.)?: None Help needed moving to and from a bed to chair (including a wheelchair)?: A Little Help needed walking in hospital room?: A Little Help needed climbing 3-5 steps with a railing? : A Little 6 Click Score: 21    End of Session Equipment Utilized During Treatment: Gait belt;Oxygen Activity Tolerance: Patient tolerated treatment well Patient left: in bed;with call bell/phone within reach;with bed alarm set;with family/visitor present   PT Visit Diagnosis: Unsteadiness on feet (R26.81);Repeated falls (R29.6);Muscle weakness (generalized) (M62.81);History of falling (Z91.81)     Time: 9147-8295 PT Time Calculation (min) (ACUTE ONLY): 23 min  Charges:  $Gait Training: 8-22 mins $Therapeutic Exercise: 8-22 mins                    G Codes:       Sharalyn Ink Fatimata Talsma PT, DPT     Jud Fanguy 07/01/2017, 10:54 AM

## 2017-07-01 NOTE — Care Management (Signed)
Patient to discharge home today.  RNCM spoke with [facility] representative and confirms that patient is active with [facility]. Contacted [facility], and insurance was updated to reflect hospice.  Patient was ordered nebulizer for discharge.  Ordered faxed to [facility], they will deliver it to the home.  Awaiting discharge summary to fax to Hospice.    RNCM spoke with Dayna Barker of [facility].  As patient was opened with palliative services in the home.  Notified Clydie Braun that at some point patient was transitioned to Hospice in the Home.  RNCM signing off.

## 2017-07-06 ENCOUNTER — Inpatient Hospital Stay: Payer: Self-pay | Admitting: Nurse Practitioner

## 2017-07-07 ENCOUNTER — Ambulatory Visit (INDEPENDENT_AMBULATORY_CARE_PROVIDER_SITE_OTHER): Payer: Medicare Other | Admitting: Psychiatry

## 2017-07-07 ENCOUNTER — Encounter: Payer: Self-pay | Admitting: Psychiatry

## 2017-07-07 VITALS — BP 125/83 | HR 84 | Temp 98.6°F | Wt 345.6 lb

## 2017-07-07 DIAGNOSIS — Z79899 Other long term (current) drug therapy: Secondary | ICD-10-CM | POA: Diagnosis not present

## 2017-07-07 DIAGNOSIS — F1010 Alcohol abuse, uncomplicated: Secondary | ICD-10-CM

## 2017-07-07 DIAGNOSIS — F200 Paranoid schizophrenia: Secondary | ICD-10-CM | POA: Diagnosis not present

## 2017-07-07 NOTE — Progress Notes (Signed)
Follow-up for 36 year old man with schizophrenia and alcohol abuse. In the short time since he was discharged from the hospital he has Artie been back to the emergency room for evaluation of another episode of GI bleeding. He has continued to drink and is back to drinking alcohol most days. He says that his mood is back to being very sad. He is having hallucinations again. This is despite his continuing to take the clozapine. He is also complaining of significant drooling from the clozapine. Patient is having passive suicidal thoughts without intent or plan.  Adequately groomed given his baseline. Sad affect. Slow speech. Feeling more depressed and hopeless. Reports hallucinations but does not appear to be grossly disorganized. Claims that he still has some thought broadcasting but he has insight into it. Understands his illness. Has tried to be compliant but has not followed up with outpatient substance abuse treatment.  Patient had very high risk for multiple complications including death. We discussed the possible utility of readmission to the psychiatric hospital but we have no beds immediately available for direct admission. I asked him if he would like to come to the emergency room with the understanding that we might refer him out to another hospital. Patient feels that he can tried to take care of himself at home for the time being and has no acute suicidal intent. He agrees that he will come into the emergency room if things get worse. We will follow-up in 2 weeks but he knows he can come in sooner if needed. I'm putting in in order to get his absolute neutrophil count checked at [facility] going forward. I gave him the address of [facility].

## 2017-07-08 ENCOUNTER — Encounter: Payer: Self-pay | Admitting: Emergency Medicine

## 2017-07-08 ENCOUNTER — Emergency Department
Admission: EM | Admit: 2017-07-08 | Discharge: 2017-07-09 | Disposition: A | Discharge status: Home / Self Care | Payer: Medicare Other | Attending: Emergency Medicine | Admitting: Emergency Medicine

## 2017-07-08 ENCOUNTER — Ambulatory Visit: Admitting: Licensed Clinical Social Worker

## 2017-07-08 DIAGNOSIS — R441 Visual hallucinations: Secondary | ICD-10-CM | POA: Diagnosis not present

## 2017-07-08 DIAGNOSIS — K7031 Alcoholic cirrhosis of liver with ascites: Secondary | ICD-10-CM | POA: Diagnosis present

## 2017-07-08 DIAGNOSIS — R440 Auditory hallucinations: Secondary | ICD-10-CM

## 2017-07-08 DIAGNOSIS — F200 Paranoid schizophrenia: Secondary | ICD-10-CM

## 2017-07-08 DIAGNOSIS — F209 Schizophrenia, unspecified: Secondary | ICD-10-CM | POA: Insufficient documentation

## 2017-07-08 DIAGNOSIS — E119 Type 2 diabetes mellitus without complications: Secondary | ICD-10-CM | POA: Diagnosis not present

## 2017-07-08 DIAGNOSIS — D649 Anemia, unspecified: Secondary | ICD-10-CM | POA: Diagnosis present

## 2017-07-08 DIAGNOSIS — F4310 Post-traumatic stress disorder, unspecified: Secondary | ICD-10-CM | POA: Diagnosis present

## 2017-07-08 DIAGNOSIS — D500 Iron deficiency anemia secondary to blood loss (chronic): Secondary | ICD-10-CM | POA: Diagnosis present

## 2017-07-08 DIAGNOSIS — J449 Chronic obstructive pulmonary disease, unspecified: Secondary | ICD-10-CM | POA: Diagnosis not present

## 2017-07-08 DIAGNOSIS — E669 Obesity, unspecified: Secondary | ICD-10-CM | POA: Diagnosis present

## 2017-07-08 DIAGNOSIS — F1020 Alcohol dependence, uncomplicated: Secondary | ICD-10-CM | POA: Diagnosis present

## 2017-07-08 DIAGNOSIS — I10 Essential (primary) hypertension: Secondary | ICD-10-CM | POA: Diagnosis present

## 2017-07-08 LAB — COMPREHENSIVE METABOLIC PANEL
ALBUMIN: 3.4 g/dL — AB (ref 3.5–5.0)
ALK PHOS: 104 U/L (ref 38–126)
ALT: 43 U/L (ref 17–63)
AST: 46 U/L — ABNORMAL HIGH (ref 15–41)
Anion gap: 7 (ref 5–15)
BILIRUBIN TOTAL: 0.9 mg/dL (ref 0.3–1.2)
BUN: 7 mg/dL (ref 6–20)
CALCIUM: 8.7 mg/dL — AB (ref 8.9–10.3)
CO2: 31 mmol/L (ref 22–32)
CREATININE: 0.83 mg/dL (ref 0.61–1.24)
Chloride: 103 mmol/L (ref 101–111)
GFR calc Af Amer: >60 mL/min (ref >60)
GFR calc non Af Amer: >60 mL/min (ref >60)
GLUCOSE: 226 mg/dL — AB (ref 65–99)
Potassium: 3.9 mmol/L (ref 3.5–5.1)
SODIUM: 141 mmol/L (ref 135–145)
Total Protein: 7.4 g/dL (ref 6.5–8.1)

## 2017-07-08 LAB — CBC
HEMATOCRIT: 34.6 % — AB (ref 40.0–52.0)
HEMOGLOBIN: 11.4 g/dL — AB (ref 13.0–18.0)
MCH: 30.2 pg (ref 26.0–34.0)
MCHC: 32.9 g/dL (ref 32.0–36.0)
MCV: 91.8 fL (ref 80.0–100.0)
Platelets: 126 10*3/uL — ABNORMAL LOW (ref 150–440)
RBC: 3.77 MIL/uL — ABNORMAL LOW (ref 4.40–5.90)
RDW: 15.1 % — ABNORMAL HIGH (ref 11.5–14.5)
WBC: 6.4 10*3/uL (ref 3.8–10.6)

## 2017-07-08 LAB — URINE DRUG SCREEN, QUALITATIVE (ARMC ONLY)
Amphetamines, Ur Screen: NOT DETECTED
BARBITURATES, UR SCREEN: NOT DETECTED
Benzodiazepine, Ur Scrn: POSITIVE — AB
CANNABINOID 50 NG, UR ~~LOC~~: NOT DETECTED
COCAINE METABOLITE, UR ~~LOC~~: NOT DETECTED
MDMA (Ecstasy)Ur Screen: NOT DETECTED
METHADONE SCREEN, URINE: NOT DETECTED
OPIATE, UR SCREEN: NOT DETECTED
PHENCYCLIDINE (PCP) UR S: NOT DETECTED
Tricyclic, Ur Screen: NOT DETECTED

## 2017-07-08 LAB — DIFFERENTIAL
BASOS ABS: 0 10*3/uL (ref 0–0.1)
BASOS PCT: 0 %
Eosinophils Absolute: 0.2 10*3/uL (ref 0–0.7)
Eosinophils Relative: 3 %
Lymphocytes Relative: 15 %
Lymphs Abs: 1 10*3/uL (ref 1.0–3.6)
MONOS PCT: 8 %
Monocytes Absolute: 0.5 10*3/uL (ref 0.2–1.0)
NEUTROS ABS: 5 10*3/uL (ref 1.4–6.5)
NEUTROS PCT: 74 %

## 2017-07-08 LAB — SALICYLATE LEVEL: Salicylate Lvl: <7 mg/dL (ref 2.8–30.0)

## 2017-07-08 LAB — GLUCOSE, CAPILLARY: GLUCOSE-CAPILLARY: 157 mg/dL — AB (ref 65–99)

## 2017-07-08 LAB — ACETAMINOPHEN LEVEL: Acetaminophen (Tylenol), Serum: <10 ug/mL — ABNORMAL LOW (ref 10–30)

## 2017-07-08 LAB — ETHANOL: Alcohol, Ethyl (B): <10 mg/dL (ref <10)

## 2017-07-08 MED ORDER — CLOZAPINE 25 MG PO TABS
50.0000 mg | ORAL_TABLET | Freq: Every day | ORAL | Status: DC
  Administered 2017-07-08: 50 mg via ORAL
  Filled 2017-07-08: qty 2

## 2017-07-08 MED ORDER — NADOLOL 20 MG PO TABS
20.0000 mg | ORAL_TABLET | Freq: Every day | ORAL | Status: DC
Start: 2017-07-08 — End: 2017-07-09
  Administered 2017-07-08 – 2017-07-09 (×2): 20 mg via ORAL
  Filled 2017-07-08 (×2): qty 1

## 2017-07-08 MED ORDER — LACTULOSE 10 GM/15ML PO SOLN
30.0000 g | Freq: Three times a day (TID) | ORAL | Status: DC
  Administered 2017-07-08 – 2017-07-09 (×3): 30 g via ORAL
  Filled 2017-07-08 (×3): qty 60

## 2017-07-08 MED ORDER — METFORMIN HCL 500 MG PO TABS
500.0000 mg | ORAL_TABLET | Freq: Two times a day (BID) | ORAL | Status: DC
  Administered 2017-07-09 (×2): 500 mg via ORAL
  Filled 2017-07-08 (×2): qty 1

## 2017-07-08 MED ORDER — SUCRALFATE 1 G PO TABS
1.0000 g | ORAL_TABLET | Freq: Three times a day (TID) | ORAL | Status: DC
  Administered 2017-07-08 – 2017-07-09 (×3): 1 g via ORAL
  Filled 2017-07-08 (×3): qty 1

## 2017-07-08 MED ORDER — LOSARTAN POTASSIUM 50 MG PO TABS
25.0000 mg | ORAL_TABLET | Freq: Every day | ORAL | Status: DC
  Administered 2017-07-08 – 2017-07-09 (×2): 25 mg via ORAL
  Filled 2017-07-08 (×2): qty 1

## 2017-07-08 MED ORDER — LEVETIRACETAM 500 MG PO TABS
1500.0000 mg | ORAL_TABLET | Freq: Two times a day (BID) | ORAL | Status: DC
  Administered 2017-07-08 – 2017-07-09 (×2): 1500 mg via ORAL
  Filled 2017-07-08 (×2): qty 3

## 2017-07-08 MED ORDER — FERROUS SULFATE 325 (65 FE) MG PO TABS
325.0000 mg | ORAL_TABLET | Freq: Every day | ORAL | Status: DC
  Administered 2017-07-09: 325 mg via ORAL
  Filled 2017-07-08: qty 1

## 2017-07-08 MED ORDER — PANTOPRAZOLE SODIUM 40 MG PO TBEC
40.0000 mg | DELAYED_RELEASE_TABLET | Freq: Every day | ORAL | Status: DC
  Administered 2017-07-08 – 2017-07-09 (×2): 40 mg via ORAL
  Filled 2017-07-08 (×3): qty 1

## 2017-07-08 MED ORDER — VITAMIN B-1 100 MG PO TABS
100.0000 mg | ORAL_TABLET | Freq: Every day | ORAL | Status: DC
  Administered 2017-07-08 – 2017-07-09 (×2): 100 mg via ORAL
  Filled 2017-07-08 (×2): qty 1

## 2017-07-08 MED ORDER — CHLORDIAZEPOXIDE HCL 25 MG PO CAPS
25.0000 mg | ORAL_CAPSULE | Freq: Two times a day (BID) | ORAL | Status: DC
  Administered 2017-07-08 – 2017-07-09 (×2): 25 mg via ORAL
  Filled 2017-07-08 (×2): qty 1

## 2017-07-08 MED ORDER — FOLIC ACID 1 MG PO TABS
1.0000 mg | ORAL_TABLET | Freq: Every day | ORAL | Status: DC
  Administered 2017-07-08 – 2017-07-09 (×2): 1 mg via ORAL
  Filled 2017-07-08 (×2): qty 1

## 2017-07-08 MED ORDER — ALBUTEROL SULFATE HFA 108 (90 BASE) MCG/ACT IN AERS
2.0000 | INHALATION_SPRAY | RESPIRATORY_TRACT | Status: DC | PRN
  Filled 2017-07-08: qty 6.7

## 2017-07-08 NOTE — ED Notes (Signed)
Pharmacy notified to verify and send medications. 

## 2017-07-08 NOTE — Consult Note (Signed)
[facility] Consult   Reason for Consult:  Consult for 36 year old man with a history of schizophrenia and alcohol abuse who comes in complaining of suicidal ideation Referring Physician:  Quentin Cornwall Patient Identification: Howard [name] MRN:  [id number] Principal Diagnosis: Paranoid schizophrenia [facility]) Diagnosis:   Patient Active Problem List   Diagnosis Date Noted  . Schizophrenia, paranoid ([place]) [F20.0] 06/09/2017  . PTSD (post-traumatic stress disorder) [F43.10] 06/08/2017  . Inflamed external hemorrhoid [K64.4] 05/13/2017  . End stage liver disease ([place]) [K72.90] 04/24/2017  . Esophageal varices (HCC) [I85.00]   . Hematemesis [K92.0] 04/05/2017  . Upper GI bleed [K92.2]   . Iron deficiency anemia due to chronic blood loss [D50.0] 02/16/2017  . Nausea [R11.0] 01/18/2017  . Respiratory failure with hypoxia ([place]) [J96.91] 12/14/2016  . Seizures ([place]) [R56.9] 12/12/2016  . DNR (do not resuscitate) discussion [Z71.89] 08/11/2016  . Muscle weakness (generalized) [M62.81]   . GI bleed [K92.2] 08/07/2016  . OSA (obstructive sleep apnea) [G47.33] 04/29/2016  . Anemia [D64.9] 04/29/2016  . Thrombocytopenia ([place]) [D69.6] 04/29/2016  . Coagulopathy ([place]) [D68.9] 04/29/2016  . Obesity [E66.9] 04/29/2016  . Controlled type 2 diabetes mellitus without complication ([place]) [R00.7] 01/15/2016  . Essential (primary) hypertension [I10] 12/11/2015  . Pure hypercholesterolemia [E78.00] 12/11/2015  . Hepatic encephalopathy ([place]) [K72.90] 10/16/2015  . Alcoholic cirrhosis of liver with ascites ([place]) [K70.31] 07/19/2015  . Elevated transaminase level [R74.0] 07/04/2015  . Alcohol abuse [F10.10] 05/31/2015  . Paranoid schizophrenia ([place]) [F20.0] 03/20/2015    Total Time spent with patient: 1 hour  Subjective:   Howard [name] is a 36 y.o. male patient admitted with "the voices are getting so loud I can't stand it".  HPI:  36 year old man with long-standing mental health  problems. Diagnosis of schizophrenia. Also more recent history of alcohol abuse with severe complications and likely PTSD. Patient is known to me from the outpatient clinic. He was recently discharged from the psychiatric unit and at that time appeared to be doing well but after going home he continued to drink and has continued to have serious medical problems. Despite reported compliance with clozapine he says that his auditory hallucinations have grown increasingly loud. They are telling him to kill himself. Patient feels sad and overwhelmed all the time. Can't sleep well. No homicidal ideation. Very sad and hopeless mood.  Social history: Patient lives at home with his mother. Not able to work. Reasonably good support from his family.  Medical history: Overweight but also now has diagnosis of cirrhosis related to his heavy alcohol use. Consequently has esophageal varices and bleeding problems. Additionally mild but controlled diabetes and high blood pressure.  Substance abuse history: Patient had of later onset of alcohol problem but has now been drinking very heavily for the last several years. Despite substance abuse treatment and attempts to engage him in appropriate therapy and the patient stated goal of stopping drinking he has been unable to get it under control at home. He does not have a history of delirium tremens. Unclear if he has had withdrawal seizures in the past. Does not abuse other drugs.  Past Psychiatric History: Patient has had prior hospitalizations but had gone for several years without them until recently. Recent hospitalization was marked by a transition in his medicine changing him over to clozapine. He appeared to show significant improvement in the hospital but has decompensated after going home again which I think is probably largely the result of his alcohol abuse. Does have a past history of  suicidality no history of violence.  Risk to Self: Suicidal Ideation: No Suicidal  Intent: No Is patient at risk for suicide?: Yes Suicidal Plan?: No Access to Means: Yes What has been your use of drugs/alcohol within the last 12 months?: Alcohol How many times?: 0 Other Self Harm Risks: Alcohol use  Triggers for Past Attempts: Hallucinations, Family contact Intentional Self Injurious Behavior: Cutting Comment - Self Injurious Behavior: Last cut over a year ago Risk to Others: Homicidal Ideation: No Thoughts of Harm to Others: No Current Homicidal Intent: No Current Homicidal Plan: No Access to Homicidal Means: No Identified Victim: Reports of none History of harm to others?: No Assessment of Violence: None Noted Violent Behavior Description: Reports of none Does patient have access to weapons?: No Criminal Charges Pending?: No Does patient have a court date: No Prior Inpatient Therapy: Prior Inpatient Therapy: Yes Prior Therapy Dates: 05/2017, 05/2015 & 12/2014 Prior Therapy Facilty/Provider(s): [facility]  Reason for Treatment: Schizophrenia and Depression Prior Outpatient Therapy: Prior Outpatient Therapy: Yes Prior Therapy Dates: Current Prior Therapy Facilty/Provider(s): [facility] Reason for Treatment: Schizophrenia and Depression  Does patient have an ACCT team?: No Does patient have Intensive In-House Services?  : No Does patient have [facility] services? : No Does patient have P4CC services?: No  Past Medical History:  Past Medical History:  Diagnosis Date  . Alcoholic cirrhosis of liver with ascites ([place])   . Alcoholism ([place])   . Anemia   . Atrial fibrillation ([place])   . COPD (chronic obstructive pulmonary disease) ([place])   . Depression   . Diabetes mellitus, type II ([place])   . Esophageal varices ([name])   . Heart disease    irregular heart beat (palpitations) and heart murmur  . Hyperlipemia   . Hypertension   . Liver disease   . Multiple thyroid nodules   . Portal hypertensive gastropathy ([place])   . Schizophrenia ([place])   .  Seizures ([place])     Past Surgical History:  Procedure Laterality Date  . ESOPHAGOGASTRODUODENOSCOPY N/A 10/05/2015   Procedure: ESOPHAGOGASTRODUODENOSCOPY (EGD);  Surgeon: Josefine Class, MD;  Location: [facility] ENDOSCOPY;  Service: Endoscopy;  Laterality: N/A;  . ESOPHAGOGASTRODUODENOSCOPY N/A 06/30/2017   Procedure: ESOPHAGOGASTRODUODENOSCOPY (EGD);  Surgeon: Lin Landsman, MD;  Location: [facility] ENDOSCOPY;  Service: Gastroenterology;  Laterality: N/A;  . ESOPHAGOGASTRODUODENOSCOPY (EGD) WITH PROPOFOL N/A 08/03/2015   Procedure: ESOPHAGOGASTRODUODENOSCOPY (EGD) WITH PROPOFOL;  Surgeon: Josefine Class, MD;  Location: [facility] ENDOSCOPY;  Service: Endoscopy;  Laterality: N/A;  . ESOPHAGOGASTRODUODENOSCOPY (EGD) WITH PROPOFOL N/A 08/31/2015   Procedure: ESOPHAGOGASTRODUODENOSCOPY (EGD) WITH PROPOFOL;  Surgeon: Josefine Class, MD;  Location: [facility] ENDOSCOPY;  Service: Endoscopy;  Laterality: N/A;  . ESOPHAGOGASTRODUODENOSCOPY (EGD) WITH PROPOFOL N/A 04/04/2016   Procedure: ESOPHAGOGASTRODUODENOSCOPY (EGD) WITH PROPOFOL;  Surgeon: Manya Silvas, MD;  Location: [facility] ENDOSCOPY;  Service: Endoscopy;  Laterality: N/A;  . ESOPHAGOGASTRODUODENOSCOPY (EGD) WITH PROPOFOL N/A 11/13/2016   Procedure: ESOPHAGOGASTRODUODENOSCOPY (EGD) WITH PROPOFOL;  Surgeon: Jonathon Bellows, MD;  Location: [facility] ENDOSCOPY;  Service: Endoscopy;  Laterality: N/A;  . ESOPHAGOGASTRODUODENOSCOPY (EGD) WITH PROPOFOL N/A 04/07/2017   Procedure: ESOPHAGOGASTRODUODENOSCOPY (EGD) WITH PROPOFOL;  Surgeon: Lucilla Lame, MD;  Location: [facility] ENDOSCOPY;  Service: Endoscopy;  Laterality: N/A;  . NO PAST SURGERIES     Family History:  Family History  Problem Relation Age of Onset  . Heart disease Mother   . Hypertension Mother   . Hyperlipidemia Mother   . Stroke Father   . Heart attack Father   . Hypertension  Father   . Heart disease Father   . Alcohol abuse Father   . Heart disease Brother    Family Psychiatric  History: Positive  for mood disorder and possible alcohol use Social History:  History  Alcohol Use  . 0.0 oz/week    Comment: last drink 1 days ago (noted: 05/30/2017)     History  Drug Use No    Social History   Social History  . Marital status: Single    Spouse name: N/A  . Number of children: N/A  . Years of education: N/A   Occupational History  . disabled    Social History Main Topics  . Smoking status: Never Smoker  . Smokeless tobacco: Never Used  . Alcohol use 0.0 oz/week     Comment: last drink 1 days ago (noted: 05/30/2017)  . Drug use: No  . Sexual activity: No   Other Topics Concern  . None   Social History Narrative   ** Merged History Encounter **       Additional Social History:    Allergies:   Allergies  Allergen Reactions  . Tramadol Itching    Labs:  Results for orders placed or performed during the hospital encounter of 07/08/17 (from the past 48 hour(s))  Urine Drug Screen, Qualitative     Status: Abnormal   Collection Time: 07/08/17  1:40 PM  Result Value Ref Range   Tricyclic, Ur Screen NONE DETECTED NONE DETECTED   Amphetamines, Ur Screen NONE DETECTED NONE DETECTED   MDMA (Ecstasy)Ur Screen NONE DETECTED NONE DETECTED   Cocaine Metabolite,Ur [place] NONE DETECTED NONE DETECTED   Opiate, Ur Screen NONE DETECTED NONE DETECTED   Phencyclidine (PCP) Ur S NONE DETECTED NONE DETECTED   Cannabinoid 50 Ng, Ur [place] NONE DETECTED NONE DETECTED   Barbiturates, Ur Screen NONE DETECTED NONE DETECTED   Benzodiazepine, Ur Scrn POSITIVE (A) NONE DETECTED   Methadone Scn, Ur NONE DETECTED NONE DETECTED    Comment: (NOTE) 449  Tricyclics, urine               Cutoff 1000 ng/mL 200  Amphetamines, urine             Cutoff 1000 ng/mL 300  MDMA (Ecstasy), urine           Cutoff 500 ng/mL 400  Cocaine Metabolite, urine       Cutoff 300 ng/mL 500  Opiate, urine                   Cutoff 300 ng/mL 600  Phencyclidine (PCP), urine      Cutoff 25 ng/mL 700  Cannabinoid, urine               Cutoff 50 ng/mL 800  Barbiturates, urine             Cutoff 200 ng/mL 900  Benzodiazepine, urine           Cutoff 200 ng/mL 1000 Methadone, urine                Cutoff 300 ng/mL 1100 1200 The urine drug screen provides only a preliminary, unconfirmed 1300 analytical test result and should not be used for non-medical 1400 purposes. Clinical consideration and professional judgment should 1500 be applied to any positive drug screen result due to possible 1600 interfering substances. A more specific alternate chemical method 1700 must be used in order to obtain a confirmed analytical result.  1800 Gas chromato graphy / mass spectrometry (GC/MS) is  the preferred 1900 confirmatory method.   Comprehensive metabolic panel     Status: Abnormal   Collection Time: 07/08/17  1:41 PM  Result Value Ref Range   Sodium 141 135 - 145 mmol/L   Potassium 3.9 3.5 - 5.1 mmol/L   Chloride 103 101 - 111 mmol/L   CO2 31 22 - 32 mmol/L   Glucose, Bld 226 (H) 65 - 99 mg/dL   BUN 7 6 - 20 mg/dL   Creatinine, Ser 0.83 0.61 - 1.24 mg/dL   Calcium 8.7 (L) 8.9 - 10.3 mg/dL   Total Protein 7.4 6.5 - 8.1 g/dL   Albumin 3.4 (L) 3.5 - 5.0 g/dL   AST 46 (H) 15 - 41 U/L   ALT 43 17 - 63 U/L   Alkaline Phosphatase 104 38 - 126 U/L   Total Bilirubin 0.9 0.3 - 1.2 mg/dL   GFR calc non Af Amer >60 >60 mL/min   GFR calc Af Amer >60 >60 mL/min    Comment: (NOTE) The eGFR has been calculated using the CKD EPI equation. This calculation has not been validated in all clinical situations. eGFR's persistently <60 mL/min signify possible Chronic Kidney Disease.    Anion gap 7 5 - 15  Ethanol     Status: None   Collection Time: 07/08/17  1:41 PM  Result Value Ref Range   Alcohol, Ethyl (B) <10 <10 mg/dL    Comment:        LOWEST DETECTABLE LIMIT FOR SERUM ALCOHOL IS 10 mg/dL FOR MEDICAL PURPOSES ONLY   Salicylate level     Status: None   Collection Time: 07/08/17  1:41 PM  Result Value Ref Range    Salicylate Lvl <2.5 2.8 - 30.0 mg/dL  Acetaminophen level     Status: Abnormal   Collection Time: 07/08/17  1:41 PM  Result Value Ref Range   Acetaminophen (Tylenol), Serum <10 (L) 10 - 30 ug/mL    Comment:        THERAPEUTIC CONCENTRATIONS VARY SIGNIFICANTLY. A RANGE OF 10-30 ug/mL MAY BE AN EFFECTIVE CONCENTRATION FOR MANY PATIENTS. HOWEVER, SOME ARE BEST TREATED AT CONCENTRATIONS OUTSIDE THIS RANGE. ACETAMINOPHEN CONCENTRATIONS >150 ug/mL AT 4 HOURS AFTER INGESTION AND >50 ug/mL AT 12 HOURS AFTER INGESTION ARE OFTEN ASSOCIATED WITH TOXIC REACTIONS.   cbc     Status: Abnormal   Collection Time: 07/08/17  1:41 PM  Result Value Ref Range   WBC 6.4 3.8 - 10.6 K/uL   RBC 3.77 (L) 4.40 - 5.90 MIL/uL   Hemoglobin 11.4 (L) 13.0 - 18.0 g/dL   HCT 34.6 (L) 40.0 - 52.0 %   MCV 91.8 80.0 - 100.0 fL   MCH 30.2 26.0 - 34.0 pg   MCHC 32.9 32.0 - 36.0 g/dL   RDW 15.1 (H) 11.5 - 14.5 %   Platelets 126 (L) 150 - 440 K/uL  Glucose, capillary     Status: Abnormal   Collection Time: 07/08/17  4:20 PM  Result Value Ref Range   Glucose-Capillary 157 (H) 65 - 99 mg/dL    Current Facility-Administered Medications  Medication Dose Route Frequency Provider Last Rate Last Dose  . albuterol (PROVENTIL HFA;VENTOLIN HFA) 108 (90 Base) MCG/ACT inhaler 2 puff  2 puff Inhalation Q4H PRN [name], [name], MD      . cloZAPine (CLOZARIL) tablet 50 mg  50 mg Oral QHS [name], [name], MD      . Derrill Memo ON 07/09/2017] ferrous sulfate tablet 325 mg  325 mg Oral Q  breakfast [name], Madie Reno, MD      . folic acid (FOLVITE) tablet 1 mg  1 mg Oral Daily [name], [name], MD      . lactulose (CHRONULAC) 10 GM/15ML solution 30 g  30 g Oral TID [name], [name], MD      . levETIRAcetam (KEPPRA) tablet 1,500 mg  1,500 mg Oral BID [name], [name], MD      . losartan (COZAAR) tablet 25 mg  25 mg Oral Daily [name], [name], MD      . Derrill Memo ON 07/09/2017] metFORMIN (GLUCOPHAGE) tablet 500 mg  500 mg Oral BID WC [name],  [name], MD      . nadolol (CORGARD) tablet 20 mg  20 mg Oral Daily [name], [name], MD      . pantoprazole (PROTONIX) EC tablet 40 mg  40 mg Oral Daily [name], [name], MD      . sucralfate (CARAFATE) tablet 1 g  1 g Oral TID WC & HS [name], [name], MD      . thiamine (VITAMIN B-1) tablet 100 mg  100 mg Oral Daily [name], Madie Reno, MD       Current Outpatient Prescriptions  Medication Sig Dispense Refill  . albuterol (PROVENTIL HFA;VENTOLIN HFA) 108 (90 Base) MCG/ACT inhaler Inhale 2 puffs into the lungs every 4 (four) hours as needed for wheezing or shortness of breath. 1 Inhaler 3  . cloZAPine (CLOZARIL) 50 MG tablet Take 3 tablets (150 mg total) by mouth at bedtime. 90 tablet 3  . ferrous sulfate 325 (65 FE) MG tablet Take 1 tablet (325 mg total) by mouth daily with breakfast. 30 tablet 3  . folic acid (FOLVITE) 1 MG tablet Take 1 tablet (1 mg total) by mouth daily. 30 tablet 3  . hydrOXYzine (ATARAX/VISTARIL) 25 MG tablet Take 1 tablet (25 mg total) by mouth 3 (three) times daily as needed for anxiety. 30 tablet 3  . ipratropium-albuterol (DUONEB) 0.5-2.5 (3) MG/3ML SOLN Take 3 mLs by nebulization every 6 (six) hours. 360 mL 1  . lactulose (CHRONULAC) 10 GM/15ML solution Take 67.5 mLs (45 g total) by mouth 3 (three) times daily. 240 mL 3  . levETIRAcetam (KEPPRA) 750 MG tablet Take 2 tablets (1,500 mg total) by mouth 2 (two) times daily. 120 tablet 3  . LORazepam (ATIVAN) 0.5 MG tablet Take 0.5 mg by mouth every 4 (four) hours as needed for anxiety.    Marland Kitchen losartan (COZAAR) 25 MG tablet Take 1 tablet (25 mg total) by mouth daily. 30 tablet 3  . metFORMIN (GLUCOPHAGE) 500 MG tablet Take 1 tablet (500 mg total) by mouth 2 (two) times daily with a meal. 60 tablet 3  . Morphine Sulfate (MORPHINE CONCENTRATE) 10 mg / 0.5 ml concentrated solution Take 0.5 mg by mouth every 2 (two) hours as needed for severe pain or shortness of breath.    . nadolol (CORGARD) 20 MG tablet Take 1 tablet (20 mg total)  by mouth daily. 30 tablet 3  . pantoprazole (PROTONIX) 40 MG tablet Take 1 tablet (40 mg total) by mouth daily. 30 tablet 3  . prochlorperazine (COMPAZINE) 10 MG tablet Take 10 mg by mouth every 6 (six) hours as needed for nausea or vomiting (or migraine).    . rifaximin (XIFAXAN) 550 MG TABS tablet Take 1 tablet (550 mg total) by mouth 2 (two) times daily. 60 tablet 3  . sucralfate (CARAFATE) 1 g tablet Take 1 tablet (1 g total) by mouth 4 (  four) times daily -  with meals and at bedtime. 120 tablet 3  . thiamine 100 MG tablet Take 1 tablet (100 mg total) by mouth daily. 30 tablet 3  . traZODone (DESYREL) 100 MG tablet Take 100 mg by mouth at bedtime as needed for sleep.      Musculoskeletal: Strength & Muscle Tone: within normal limits Gait & Station: normal Patient leans: N/A  Psychiatric Specialty Exam: Physical Exam  Nursing note and vitals reviewed. Constitutional: He appears well-developed and well-nourished.  HENT:  Head: Normocephalic and atraumatic.  Eyes: Pupils are equal, round, and reactive to light. Conjunctivae are normal.  Neck: Normal range of motion.  Cardiovascular: Regular rhythm and normal heart sounds.   Respiratory: Effort normal. No respiratory distress.  GI: Soft.  Musculoskeletal: Normal range of motion.  Neurological: He is alert.  Skin: Skin is warm and dry.  Psychiatric: His mood appears anxious. His speech is delayed. He is slowed and actively hallucinating. Thought content is paranoid. He expresses impulsivity. He exhibits a depressed mood. He expresses suicidal ideation. He exhibits abnormal recent memory.    Review of Systems  Constitutional: Positive for malaise/fatigue.  HENT: Negative.   Eyes: Negative.   Respiratory: Negative.   Cardiovascular: Negative.   Gastrointestinal: Positive for abdominal pain.  Musculoskeletal: Negative.   Skin: Negative.   Neurological: Negative.   Psychiatric/Behavioral: Positive for depression, hallucinations,  memory loss, substance abuse and suicidal ideas. The patient is nervous/anxious and has insomnia.     Blood pressure 130/84, pulse 63, temperature 98.2 F (36.8 C), temperature source Oral, resp. rate 20, weight (!) 149.7 kg (330 lb), SpO2 91 %.Body mass index is 41.25 kg/m.  General Appearance: Disheveled  Eye Contact:  Fair  Speech:  Slow  Volume:  Decreased  Mood:  Depressed  Affect:  Depressed  Thought Process:  Goal Directed  Orientation:  Full (Time, Place, and Person)  Thought Content:  Logical, Rumination and Tangential  Suicidal Thoughts:  Yes.  with intent/plan  Homicidal Thoughts:  No  Memory:  Immediate;   Fair Recent;   Fair Remote;   Fair  Judgement:  Fair  Insight:  Fair  Psychomotor Activity:  Decreased  Concentration:  Concentration: Fair  Recall:  [employer] of Knowledge:  Fair  Language:  Fair  Akathisia:  No  Handed:  Right  AIMS (if indicated):     Assets:  Desire for Improvement Housing  ADL's:  Intact  Cognition:  Impaired,  Mild  Sleep:        Treatment Plan Summary: Daily contact with patient to assess and evaluate symptoms and progress in treatment, Medication management and Plan 36 year old man with schizophrenia and alcohol abuse comes back to the emergency room. I had seen him in the office yesterday and we had not chosen to admit him but I made it clear to him that he could return to the emergency room if suicidality was getting more intense. Patient overnight continued to drink and is now saying the hallucinations are telling him to kill himself and are overwhelming. He will be admitted to the psychiatric unit. Continue current medicine. I think a very brief amount of Librium will probably be planning to get him through any withdrawal symptoms. Engage in individual and group therapy. No change yet to the clozapine for now.  Disposition: Recommend psychiatric Inpatient admission when medically cleared. Supportive therapy provided about ongoing  stressors.  Alethia Berthold, MD 07/08/2017 6:35 PM

## 2017-07-08 NOTE — ED Notes (Signed)
IVC 

## 2017-07-08 NOTE — ED Notes (Signed)
Pt ambulated to bathroom with walker assistance.

## 2017-07-08 NOTE — ED Notes (Signed)
PT IVC/ PENDING PLACEMENT  

## 2017-07-08 NOTE — ED Notes (Signed)
Pt provided dinner tray.

## 2017-07-08 NOTE — ED Provider Notes (Signed)
[facility] Emergency Department Provider Note  ____________________________________________  Time seen: Approximately 3:35 PM  I have reviewed the triage vital signs and the nursing notes.   HISTORY  Chief Complaint Hallucinations    HPI Laden Myan Suit is a 36 y.o. male with a history of COPD, A. Fib, diabetes, hypertension, hyperlipidemia, cirrhosis, schizophrenia presenting with increasing auditory and visual hallucinations.the patient reports that he was seen here several weeks ago and had a medication change by Dr. Delaney Meigs. Over the past several days, his hallucinations have become "stronger." And they are scary to him. No SI or HI. The patient has no acute medical complaints today.   Past Medical History:  Diagnosis Date  . Alcoholic cirrhosis of liver with ascites (HCC)   . Alcoholism (HCC)   . Anemia   . Atrial fibrillation (HCC)   . COPD (chronic obstructive pulmonary disease) (HCC)   . Depression   . Diabetes mellitus, type II (HCC)   . Esophageal varices (HCC)   . Heart disease    irregular heart beat (palpitations) and heart murmur  . Hyperlipemia   . Hypertension   . Liver disease   . Multiple thyroid nodules   . Portal hypertensive gastropathy (HCC)   . Schizophrenia (HCC)   . Seizures [facility])     Patient Active Problem List   Diagnosis Date Noted  . Schizophrenia, paranoid (HCC) 06/09/2017  . PTSD (post-traumatic stress disorder) 06/08/2017  . Inflamed external hemorrhoid 05/13/2017  . End stage liver disease (HCC) 04/24/2017  . Esophageal varices (HCC)   . Hematemesis 04/05/2017  . Upper GI bleed   . Iron deficiency anemia due to chronic blood loss 02/16/2017  . Nausea 01/18/2017  . Respiratory failure with hypoxia (HCC) 12/14/2016  . Seizures (HCC) 12/12/2016  . DNR (do not resuscitate) discussion 08/11/2016  . Muscle weakness (generalized)   . GI bleed 08/07/2016  . OSA (obstructive sleep apnea) 04/29/2016  . Anemia  04/29/2016  . Thrombocytopenia (HCC) 04/29/2016  . Coagulopathy (HCC) 04/29/2016  . Obesity 04/29/2016  . Controlled type 2 diabetes mellitus without complication (HCC) 01/15/2016  . Essential (primary) hypertension 12/11/2015  . Pure hypercholesterolemia 12/11/2015  . Hepatic encephalopathy (HCC) 10/16/2015  . 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 Elevated transaminase level 07/04/2015  . Alcohol abuse 05/31/2015  . Paranoid schizophrenia (HCC) 03/20/2015    Past Surgical History:  Procedure Laterality Date  . ESOPHAGOGASTRODUODENOSCOPY N/A 10/05/2015   Procedure: ESOPHAGOGASTRODUODENOSCOPY (EGD);  Surgeon: Elnita Maxwell, MD;  Location: [facility] ENDOSCOPY;  Service: Endoscopy;  Laterality: N/A;  . ESOPHAGOGASTRODUODENOSCOPY N/A 06/30/2017   Procedure: ESOPHAGOGASTRODUODENOSCOPY (EGD);  Surgeon: Toney Reil, MD;  Location: [facility] ENDOSCOPY;  Service: Gastroenterology;  Laterality: N/A;  . ESOPHAGOGASTRODUODENOSCOPY (EGD) WITH PROPOFOL N/A 08/03/2015   Procedure: ESOPHAGOGASTRODUODENOSCOPY (EGD) WITH PROPOFOL;  Surgeon: Elnita Maxwell, MD;  Location: [facility] ENDOSCOPY;  Service: Endoscopy;  Laterality: N/A;  . ESOPHAGOGASTRODUODENOSCOPY (EGD) WITH PROPOFOL N/A 08/31/2015   Procedure: ESOPHAGOGASTRODUODENOSCOPY (EGD) WITH PROPOFOL;  Surgeon: Elnita Maxwell, MD;  Location: [facility] ENDOSCOPY;  Service: Endoscopy;  Laterality: N/A;  . ESOPHAGOGASTRODUODENOSCOPY (EGD) WITH PROPOFOL N/A 04/04/2016   Procedure: ESOPHAGOGASTRODUODENOSCOPY (EGD) WITH PROPOFOL;  Surgeon: Scot Jun, MD;  Location: [facility] ENDOSCOPY;  Service: Endoscopy;  Laterality: N/A;  . ESOPHAGOGASTRODUODENOSCOPY (EGD) WITH PROPOFOL N/A 11/13/2016   Procedure: ESOPHAGOGASTRODUODENOSCOPY (EGD) WITH PROPOFOL;  Surgeon: Wyline Mood, MD;  Location: [facility] ENDOSCOPY;  Service: Endoscopy;  Laterality: N/A;  . ESOPHAGOGASTRODUODENOSCOPY (EGD) WITH PROPOFOL N/A 04/07/2017  Procedure:  ESOPHAGOGASTRODUODENOSCOPY (EGD) WITH PROPOFOL;  Surgeon: Midge MiniumWohl, Darren, MD;  [facility] END[facility];  Service: Endoscopy;  Laterality: N/A;  . NO PAST SURGERIES      Current Outpatient Rx  . Order #: [id number] Class: Normal  . Order #: [id number] Class: Normal  . Order #: [id number] Class: Normal  . Order #: [id number] Class: Normal  . Order #: [id number] Class: Normal  . Order #: [id number] Class: Normal  . Order #: [id number] Class: Normal  . Order #: [id number] Class: Normal  . Order #: [id number] Class: Historical Med  . Order #: [id number] Class: Normal  . Order #: [id number] Class: Normal  . Order #: [id number] Class: Historical Med  . Order #: [id number] Class: Normal  . Order #: [id number] Class: Normal  . Order #: [id number] Class: Historical Med  . Order #: [id number] Class: Normal  . Order #: [id number] Class: Normal  . Order #: [id number] Class: Normal  . Order #: [id number] Class: Historical Med    Allergies Tramadol  Family History  Problem Relation Age of Onset  . Heart disease Mother   . Hypertension Mother   . Hyperlipidemia Mother   . Stroke Father   . Heart attack Father   . Hypertension Father   . Heart disease Father   . Alcohol abuse Father   . Heart disease Brother     Social History Social History  Substance Use Topics  . Smoking status: Never Smoker  . Smokeless tobacco: Never Used  . Alcohol use 0.0 oz/week     Comment: last drink 1 days ago (noted: 05/30/2017)    Review of Systems Constitutional: No fever/chills. Eyes: No visual changes. ENT: No sore throat. No congestion or rhinorrhea. Cardiovascular: Denies chest pain. Denies palpitations. Respiratory: Denies shortness of breath.  No cough. Gastrointestinal: No abdominal pain.  No nausea, no vomiting.  No diarrhea.  No constipation. Genitourinary: Negative for dysuria. Musculoskeletal: Negative for back pain. Skin: Negative for rash. Neurological: Negative for headaches. No focal numbness, tingling or weakness.   Psychiatric:positive auditory and visual hallucinations. No SI or HI.  ____________________________________________   PHYSICAL EXAM:  VITAL SIGNS: ED Triage Vitals  Enc Vitals Group     BP 07/08/17 1332 130/84     Pulse Rate 07/08/17 1332 63     Resp 07/08/17 1332 20     Temp 07/08/17 1332 98.2 F (36.8 C)     Temp Source 07/08/17 1332 Oral     SpO2 07/08/17 1332 91 %     Weight 07/08/17 1333 (!) 330 lb (149.7 kg)     Height --      Head Circumference --      Peak Flow --      Pain Score 07/08/17 1334 0     Pain Loc --      Pain Edu? --      Excl. in GC? --     Constitutional: Alert and oriented. Well appearing and in no acute distress. Answers questions appropriately. Eyes: Conjunctivae are normal.  EOMI. No scleral icterus. Head: Atraumatic. Nose: No congestion/rhinnorhea. Mouth/Throat: Mucous membranes are moist.  Neck: No stridor.  Supple.   Cardiovascular: Normal rate, regular rhythm. No murmurs, rubs or gallops.  Respiratory: Normal respiratory effort.  No accessory muscle use or retractions. Lungs CTAB.  No wheezes, rales or ronchi. Gastrointestinal: Soft, nontender and nondistended.  No guarding or rebound.  No peritoneal signs. Musculoskeletal: No LE edema. No ttp in the calves or palpable cords.  Negative Homan's sign. Neurologic:  A&Ox3.  Speech is clear.  Face and smile are symmetric.  EOMI.  Moves all extremities well. Skin:  Skin is warm, dry and intact. No rash noted. Psychiatric:patient has normal mood and a flat affect. He denies SI or HI. He does report auditory and visual hallucinations. ____________________________________________   LABS (all labs ordered are listed, but only abnormal results are displayed)  Labs Reviewed  COMPREHENSIVE METABOLIC PANEL - Abnormal; Notable for the following:       Result Value   Glucose, Bld 226 (*)    Calcium 8.7 (*)    Albumin 3.4 (*)    AST 46 (*)    All other components within normal limits  ACETAMINOPHEN  LEVEL - Abnormal; Notable for the following:    Acetaminophen (Tylenol), Serum <10 (*)    All other components within normal limits  CBC - Abnormal; Notable for the following:    RBC 3.77 (*)    Hemoglobin 11.4 (*)    HCT 34.6 (*)    RDW 15.1 (*)    Platelets 126 (*)    All other components within normal limits  ETHANOL  SALICYLATE LEVEL  URINE DRUG SCREEN, QUALITATIVE ([facility] ONLY)   ____________________________________________  EKG  Not indicated ____________________________________________  RADIOLOGY  No results found.  ____________________________________________   PROCEDURES  Procedure(s) performed: None  Procedures  Critical Care performed: No ____________________________________________   INITIAL IMPRESSION / ASSESSMENT AND PLAN / ED COURSE  Pertinent labs & imaging results that were available during my care of the patient were reviewed by me and considered in my medical decision making (see chart for details).  36 y.o. male with a history of schizophrenia and multiple other medical comorbidities presenting with auditory and visual hallucinations. The patient has no SI or HI, but his hallucinations are worsening signs have placed him under involuntary commitment. The patient has no acute medical complaints at this time and has been medically cleared for psychiatric disposition.  ____________________________________________  FINAL CLINICAL IMPRESSION(S) / ED DIAGNOSES  Final diagnoses:  Hallucinations, visual  Auditory hallucinations         NEW MEDICATIONS STARTED DURING THIS VISIT:  New Prescriptions   No medications on file      Rockne Menghini, MD 07/08/17 1538

## 2017-07-08 NOTE — ED Triage Notes (Signed)
Pt c/o hearing voices and states they are telling him to hurt himself. Pt also c/o visual halluncinations, states the image that he is seeing is described as half man and half goat with red firey eyes. Pt denies any thoughts of wanting to harm himself and has been taking his meds. Pt was just seen by Dr. Toni Amendlapacs yesterday, but did not mention this episode.

## 2017-07-08 NOTE — Progress Notes (Signed)
MEDICATION RELATED CONSULT NOTE - INITIAL   Pharmacy Consult for Clozapine REMS reporting and Lab monitoring   Allergies  Allergen Reactions  . Tramadol Itching    Patient Measurements: Weight: (!) 330 lb (149.7 kg)  Vital Signs: Temp: 98.2 F (36.8 C) (10/17 1332) Temp Source: Oral (10/17 1332) BP: 130/84 (10/17 1332) Pulse Rate: 63 (10/17 1332) Intake/Output from previous day: No intake/output data recorded. Intake/Output from this shift: No intake/output data recorded.  Labs:  Recent Labs  07/08/17 1341  WBC 6.4  HGB 11.4*  HCT 34.6*  PLT 126*  CREATININE 0.83  ALBUMIN 3.4*  PROT 7.4  AST 46*  ALT 43  ALKPHOS 104  BILITOT 0.9   Estimated Creatinine Clearance: 188.8 mL/min (by C-G formula based on SCr of 0.83 mg/dL).   Microbiology: No results found for this or any previous visit (from the past 720 hour(s)).  Medical History: Past Medical History:  Diagnosis Date  . Alcoholic cirrhosis of liver with ascites (HCC)   . Alcoholism (HCC)   . Anemia   . Atrial fibrillation (HCC)   . COPD (chronic obstructive pulmonary disease) (HCC)   . Depression   . Diabetes mellitus, type II (HCC)   . Esophageal varices (HCC)   . Heart disease    irregular heart beat (palpitations) and heart murmur  . Hyperlipemia   . Hypertension   . Liver disease   . Multiple thyroid nodules   . Portal hypertensive gastropathy (HCC)   . Schizophrenia (HCC)   . Seizures [facility](HCC)     Assessment: 36 yo male with PMH of Schizophrenia. Pharmacy consulted for Clozapine REMS reporting and lab monitoring.   Plan:  10/17 ANC 5000. [facility]. Patient is listed as eligible to receive clozapine with weekly CBC/diff monitoring and reporting.  Next CBC w/diff scheduled for 07/15/17.  Gardner CandleSheema M Yu Peggs, PharmD, BCPS Clinical Pharmacist 07/08/2017 7:14 PM

## 2017-07-08 NOTE — ED Notes (Signed)
TTS to bedside at this time. 

## 2017-07-08 NOTE — ED Notes (Signed)
Pt ambulatory to bathroom without difficulty.  

## 2017-07-08 NOTE — BH Assessment (Signed)
Assessment Note  Howard [name] is an 36 y.o. male who presents to the ER due to increase AV/H with commands. He states he was recently inpatient at [facility] had some improvement. "I thought I was ready to go. The voices eased up and they [facility] staff) asked me if I was ready to go home and I thought I was... "The voices came back and I think they brought their friends. They just got loud and louder..." The voices are telling him to commit suicide. He reports he do not want to self-harm but because the voices are "too strong and I want them to stop." Patient express his concern that he is unable to manage the voices and he is afraid of what he may do. "I'm tired of this..."  During the interview, the patient was calm, cooperative and pleasant. He was able to answer questions with appropriate answers. He denies history of violence and aggression. He denies HI.  Diagnosis: Schizophrenia  Past Medical History:  Past Medical History:  Diagnosis Date  . Alcoholic cirrhosis of liver with ascites (HCC)   . Alcoholism (HCC)   . Anemia   . Atrial fibrillation (HCC)   . COPD (chronic obstructive pulmonary disease) (HCC)   . Depression   . Diabetes mellitus, type II (HCC)   . Esophageal varices (HCC)   . Heart disease    irregular heart beat (palpitations) and heart murmur  . Hyperlipemia   . Hypertension   . Liver disease   . Multiple thyroid nodules   . Portal hypertensive gastropathy (HCC)   . Schizophrenia (HCC)   . Seizures (HCC)     Past Surgical History:  Procedure Laterality Date  . ESOPHAGOGASTRODUODENOSCOPY N/A 10/05/2015   Procedure: ESOPHAGOGASTRODUODENOSCOPY (EGD);  Surgeon: Elnita Maxwell, MD;  Location: [facility] ENDOSCOPY;  Service: Endoscopy;  Laterality: N/A;  . ESOPHAGOGASTRODUODENOSCOPY N/A 06/30/2017   Procedure: ESOPHAGOGASTRODUODENOSCOPY (EGD);  Surgeon: Toney Reil, MD;  Location: [facility] ENDOSCOPY;  Service: Gastroenterology;  Laterality: N/A;  .  ESOPHAGOGASTRODUODENOSCOPY (EGD) WITH PROPOFOL N/A 08/03/2015   Procedure: ESOPHAGOGASTRODUODENOSCOPY (EGD) WITH PROPOFOL;  Surgeon: Elnita Maxwell, MD;  Location: [facility] ENDOSCOPY;  Service: Endoscopy;  Laterality: N/A;  . ESOPHAGOGASTRODUODENOSCOPY (EGD) WITH PROPOFOL N/A 08/31/2015   Procedure: ESOPHAGOGASTRODUODENOSCOPY (EGD) WITH PROPOFOL;  Surgeon: Elnita Maxwell, MD;  Location: [facility] ENDOSCOPY;  Service: Endoscopy;  Laterality: N/A;  . ESOPHAGOGASTRODUODENOSCOPY (EGD) WITH PROPOFOL N/A 04/04/2016   Procedure: ESOPHAGOGASTRODUODENOSCOPY (EGD) WITH PROPOFOL;  Surgeon: Scot Jun, MD;  Location: [facility] ENDOSCOPY;  Service: Endoscopy;  Laterality: N/A;  . ESOPHAGOGASTRODUODENOSCOPY (EGD) WITH PROPOFOL N/A 11/13/2016   Procedure: ESOPHAGOGASTRODUODENOSCOPY (EGD) WITH PROPOFOL;  Surgeon: Wyline Mood, MD;  Location: [facility] ENDOSCOPY;  Service: Endoscopy;  Laterality: N/A;  . ESOPHAGOGASTRODUODENOSCOPY (EGD) WITH PROPOFOL N/A 04/07/2017   Procedure: ESOPHAGOGASTRODUODENOSCOPY (EGD) WITH PROPOFOL;  Surgeon: Midge Minium, MD;  Location: [facility] ENDOSCOPY;  Service: Endoscopy;  Laterality: N/A;  . NO PAST SURGERIES      Family History:  Family History  Problem Relation Age of Onset  . Heart disease Mother   . Hypertension Mother   . Hyperlipidemia Mother   . Stroke Father   . Heart attack Father   . Hypertension Father   . Heart disease Father   . Alcohol abuse Father   . Heart disease Brother     Social History:  reports that he has never smoked. He has never used smokeless tobacco. He reports that he drinks alcohol. He reports that he does not use drugs.  Additional Social History:  Alcohol / Drug Use Pain Medications: See PTA Prescriptions: See PTA Over the Counter: See PTA History of alcohol / drug use?: Yes Longest period of sobriety (when/how long): Years Negative Consequences of Use:  (Cause symptoms to increase) Withdrawal Symptoms:  (n/a) Substance #1 Name of Substance 1:  Alcohol 1 - Last Use / Amount: Unknown  CIWA: CIWA-Ar BP: 130/84 Pulse Rate: 63 COWS:    Allergies:  Allergies  Allergen Reactions  . Tramadol Itching    Home Medications:  (Not in a hospital admission)  OB/GYN Status:  No LMP for male patient.  General Assessment Data Assessment unable to be completed: Yes [facility] ED TTS Assessment: In system Is this a Tele or Face-to-Face Assessment?: Face-to-Face Is this an Initial Assessment or a Re-assessment for this encounter?: Initial Assessment Marital status: Single Maiden name: n/a Is patient pregnant?: No Pregnancy Status: No Living Arrangements: Parent, Other relatives, Other (Comment) Can pt return to current living arrangement?: Yes Admission Status: Voluntary Is patient capable of signing voluntary admission?: Yes Referral Source: Self/Family/Friend Insurance type: Medicare  Medical Screening Exam [facility] Walk-in ONLY) Medical Exam completed: Yes  Crisis Care Plan Living Arrangements: Parent, Other relatives, Other (Comment) Legal Guardian: Other: (Self) Name of Psychiatrist: Dr. Toni Amendlapacs ([facility]) Name of Therapist: [facility]  Education Status Is patient currently in school?: No Current Grade: n/a Highest grade of school patient has completed: 9th Grade Name of school: n/a Contact person: n/a  Risk to self with the past 6 months Suicidal Ideation: No Has patient been a risk to self within the past 6 months prior to admission? : Yes Suicidal Intent: No Has patient had any suicidal intent within the past 6 months prior to admission? : No Is patient at risk for suicide?: Yes Suicidal Plan?: No Has patient had any suicidal plan within the past 6 months prior to admission? : No Access to Means: Yes What has been your use of drugs/alcohol within the last 12 months?: Alcohol Previous Attempts/Gestures: No How many times?: 0 Other Self Harm Risks:  Alcohol use  Triggers for Past Attempts: Hallucinations, Family contact Intentional Self Injurious Behavior: Cutting Comment - Self Injurious Behavior: Last cut over a year ago Family Suicide History: No Recent stressful life event(s): Other (Comment) (Command AV/H) Persecutory voices/beliefs?: Yes Depression: Yes Depression Symptoms: Tearfulness, Isolating, Fatigue, Loss of interest in usual pleasures, Feeling worthless/self pity Substance abuse history and/or treatment for substance abuse?: Yes Suicide prevention information given to non-admitted patients: Not applicable  Risk to Others within the past 6 months Homicidal Ideation: No Does patient have any lifetime risk of violence toward others beyond the six months prior to admission? : No Thoughts of Harm to Others: No Current Homicidal Intent: No Current Homicidal Plan: No Access to Homicidal Means: No Identified Victim: Reports of none History of harm to others?: No Assessment of Violence: None Noted Violent Behavior Description: Reports of none Does patient have access to weapons?: No Criminal Charges Pending?: No Does patient have a court date: No Is patient on probation?: No  Psychosis Hallucinations: Auditory, Visual Delusions: None noted  Mental Status Report Appearance/Hygiene: Unremarkable, In scrubs Eye Contact: Good Motor Activity: Freedom of movement, Unremarkable Speech: Logical/coherent, Soft, Unremarkable Level of Consciousness: Alert Mood: Depressed, Anxious, Helpless, Sad, Pleasant Affect: Anxious, Depressed, Sad Anxiety Level: Minimal Thought Processes: Coherent, Relevant Judgement: Partial Orientation: Person, Place, Time, Situation, Appropriate for developmental age Obsessive Compulsive Thoughts/Behaviors: Moderate  Cognitive Functioning Concentration: Decreased  Memory: Recent Intact, Remote Intact IQ: Average Insight: Fair Impulse Control: Fair Appetite: Good Weight Loss: 0 Weight Gain:  0 Sleep: Decreased Total Hours of Sleep: 8 Vegetative Symptoms: None  ADLScreening [facility] Assessment Services) Patient's cognitive ability adequate to safely complete daily activities?: Yes Patient able to express need for assistance with ADLs?: Yes Independently performs ADLs?: Yes (appropriate for developmental age)  Prior Inpatient Therapy Prior Inpatient Therapy: Yes Prior Therapy Dates: 05/2017, 05/2015 & 12/2014 Prior Therapy Facilty/Provider(s): [facility]  Reason for Treatment: Schizophrenia and Depression  Prior Outpatient Therapy Prior Outpatient Therapy: Yes Prior Therapy Dates: Current Prior Therapy Facilty/Provider(s): [facility] Reason for Treatment: Schizophrenia and Depression  Does patient have an ACCT team?: No Does patient have Intensive In-House Services?  : No Does patient have [facility] services? : No Does patient have P4CC services?: No  ADL Screening (condition at time of admission) Patient's cognitive ability adequate to safely complete daily activities?: Yes Is the patient deaf or have difficulty hearing?: No Does the patient have difficulty concentrating, remembering, or making decisions?: No Patient able to express need for assistance with ADLs?: Yes Does the patient have difficulty dressing or bathing?: No Independently performs ADLs?: Yes (appropriate for developmental age) Does the patient have difficulty walking or climbing stairs?: No Weakness of Legs: None Weakness of Arms/Hands: None  Home Assistive Devices/Equipment Home Assistive Devices/Equipment: Environmental consultant (specify type) Dan Humphreys with front wheels)  Therapy Consults (therapy consults require a physician order) PT Evaluation Needed: No OT Evalulation Needed: No SLP Evaluation Needed: No Abuse/Neglect Assessment (Assessment to be complete while patient is alone) Physical Abuse: Denies Verbal Abuse: Denies Sexual Abuse: Denies Exploitation of patient/patient's resources:  Denies Self-Neglect: Denies Values / Beliefs Cultural Requests During Hospitalization: None Spiritual Requests During Hospitalization: None Consults Spiritual Care Consult Needed: No Social Work Consult Needed: No Merchant navy officer (For Healthcare) Does Patient Have a Medical Advance Directive?: Yes Type of Advance Directive: Out of facility DNR (pink MOST or yellow form)    Additional Information 1:1 In Past 12 Months?: No CIRT Risk: No Elopement Risk: No Does patient have medical clearance?: Yes  Child/Adolescent Assessment Running Away Risk: Denies (Patient is an adult)  Disposition:  Disposition Initial Assessment Completed for this Encounter: Yes Disposition of Patient: Pending Review with psychiatrist  On Site Evaluation by:   Reviewed with Physician:    Lilyan Gilford MS, LCAS, LPC, NCC, CCSI Therapeutic Triage Specialist 07/08/2017 5:51 PM

## 2017-07-09 ENCOUNTER — Inpatient Hospital Stay
Admission: AD | Admit: 2017-07-09 | Discharge: 2017-07-15 | DRG: 885 | Disposition: A | Discharge status: Home / Self Care | Attending: Psychiatry | Admitting: Psychiatry

## 2017-07-09 DIAGNOSIS — E119 Type 2 diabetes mellitus without complications: Secondary | ICD-10-CM | POA: Diagnosis present

## 2017-07-09 DIAGNOSIS — K766 Portal hypertension: Secondary | ICD-10-CM | POA: Diagnosis present

## 2017-07-09 DIAGNOSIS — F4010 Social phobia, unspecified: Secondary | ICD-10-CM | POA: Diagnosis present

## 2017-07-09 DIAGNOSIS — I10 Essential (primary) hypertension: Secondary | ICD-10-CM | POA: Diagnosis present

## 2017-07-09 DIAGNOSIS — Z79899 Other long term (current) drug therapy: Secondary | ICD-10-CM | POA: Diagnosis not present

## 2017-07-09 DIAGNOSIS — I8510 Secondary esophageal varices without bleeding: Secondary | ICD-10-CM | POA: Diagnosis present

## 2017-07-09 DIAGNOSIS — K7030 Alcoholic cirrhosis of liver without ascites: Secondary | ICD-10-CM | POA: Diagnosis present

## 2017-07-09 DIAGNOSIS — Z885 Allergy status to narcotic agent status: Secondary | ICD-10-CM | POA: Diagnosis not present

## 2017-07-09 DIAGNOSIS — G40909 Epilepsy, unspecified, not intractable, without status epilepticus: Secondary | ICD-10-CM | POA: Diagnosis present

## 2017-07-09 DIAGNOSIS — F203 Undifferentiated schizophrenia: Principal | ICD-10-CM | POA: Diagnosis present

## 2017-07-09 DIAGNOSIS — F419 Anxiety disorder, unspecified: Secondary | ICD-10-CM | POA: Diagnosis present

## 2017-07-09 DIAGNOSIS — K3189 Other diseases of stomach and duodenum: Secondary | ICD-10-CM | POA: Diagnosis present

## 2017-07-09 DIAGNOSIS — R441 Visual hallucinations: Secondary | ICD-10-CM | POA: Diagnosis not present

## 2017-07-09 DIAGNOSIS — Z62810 Personal history of physical and sexual abuse in childhood: Secondary | ICD-10-CM | POA: Diagnosis present

## 2017-07-09 DIAGNOSIS — F1010 Alcohol abuse, uncomplicated: Secondary | ICD-10-CM | POA: Diagnosis present

## 2017-07-09 DIAGNOSIS — F4310 Post-traumatic stress disorder, unspecified: Secondary | ICD-10-CM | POA: Diagnosis present

## 2017-07-09 DIAGNOSIS — I959 Hypotension, unspecified: Secondary | ICD-10-CM | POA: Diagnosis not present

## 2017-07-09 DIAGNOSIS — J449 Chronic obstructive pulmonary disease, unspecified: Secondary | ICD-10-CM | POA: Diagnosis present

## 2017-07-09 DIAGNOSIS — F329 Major depressive disorder, single episode, unspecified: Secondary | ICD-10-CM | POA: Diagnosis present

## 2017-07-09 DIAGNOSIS — E785 Hyperlipidemia, unspecified: Secondary | ICD-10-CM | POA: Diagnosis present

## 2017-07-09 DIAGNOSIS — F200 Paranoid schizophrenia: Secondary | ICD-10-CM | POA: Diagnosis not present

## 2017-07-09 DIAGNOSIS — Z7984 Long term (current) use of oral hypoglycemic drugs: Secondary | ICD-10-CM

## 2017-07-09 MED ORDER — ALUM & MAG HYDROXIDE-SIMETH 200-200-20 MG/5ML PO SUSP: 30.0000 mL | ORAL | Status: DC | PRN

## 2017-07-09 MED ORDER — NADOLOL 20 MG PO TABS
20.0000 mg | ORAL_TABLET | Freq: Every day | ORAL | Status: DC
  Administered 2017-07-10 – 2017-07-15 (×5): 20 mg via ORAL
  Filled 2017-07-09 (×6): qty 1

## 2017-07-09 MED ORDER — FERROUS SULFATE 325 (65 FE) MG PO TABS
325.0000 mg | ORAL_TABLET | Freq: Every day | ORAL | Status: DC
  Administered 2017-07-10 – 2017-07-15 (×6): 325 mg via ORAL
  Filled 2017-07-09 (×6): qty 1

## 2017-07-09 MED ORDER — SUCRALFATE 1 G PO TABS
1.0000 g | ORAL_TABLET | Freq: Three times a day (TID) | ORAL | Status: DC
  Administered 2017-07-09 – 2017-07-15 (×23): 1 g via ORAL
  Filled 2017-07-09 (×26): qty 1

## 2017-07-09 MED ORDER — CLOZAPINE 25 MG PO TABS: 150.0000 mg | ORAL_TABLET | Freq: Every day | ORAL | Status: DC

## 2017-07-09 MED ORDER — ALBUTEROL SULFATE HFA 108 (90 BASE) MCG/ACT IN AERS
2.0000 | INHALATION_SPRAY | RESPIRATORY_TRACT | Status: DC | PRN
  Filled 2017-07-09: qty 6.7

## 2017-07-09 MED ORDER — PANTOPRAZOLE SODIUM 40 MG PO TBEC
40.0000 mg | DELAYED_RELEASE_TABLET | Freq: Every day | ORAL | Status: DC
  Administered 2017-07-10 – 2017-07-15 (×6): 40 mg via ORAL
  Filled 2017-07-09 (×6): qty 1

## 2017-07-09 MED ORDER — VITAMIN B-1 100 MG PO TABS
100.0000 mg | ORAL_TABLET | Freq: Every day | ORAL | Status: DC
  Administered 2017-07-10 – 2017-07-15 (×6): 100 mg via ORAL
  Filled 2017-07-09 (×6): qty 1

## 2017-07-09 MED ORDER — PERPHENAZINE 4 MG PO TABS
4.0000 mg | ORAL_TABLET | Freq: Three times a day (TID) | ORAL | Status: DC
  Filled 2017-07-09: qty 1

## 2017-07-09 MED ORDER — LOSARTAN POTASSIUM 25 MG PO TABS
25.0000 mg | ORAL_TABLET | Freq: Every day | ORAL | Status: DC
Start: 2017-07-10 — End: 2017-07-15
  Administered 2017-07-10 – 2017-07-15 (×5): 25 mg via ORAL
  Filled 2017-07-09 (×6): qty 1

## 2017-07-09 MED ORDER — CHLORDIAZEPOXIDE HCL 25 MG PO CAPS
25.0000 mg | ORAL_CAPSULE | Freq: Two times a day (BID) | ORAL | Status: AC
  Administered 2017-07-10 (×2): 25 mg via ORAL
  Filled 2017-07-09 (×2): qty 1

## 2017-07-09 MED ORDER — MAGNESIUM HYDROXIDE 400 MG/5ML PO SUSP: 30.0000 mL | Freq: Every day | ORAL | Status: DC | PRN

## 2017-07-09 MED ORDER — METFORMIN HCL 500 MG PO TABS
500.0000 mg | ORAL_TABLET | Freq: Two times a day (BID) | ORAL | Status: DC
  Administered 2017-07-10 – 2017-07-15 (×11): 500 mg via ORAL
  Filled 2017-07-09 (×11): qty 1

## 2017-07-09 MED ORDER — LACTULOSE 10 GM/15ML PO SOLN
30.0000 g | Freq: Three times a day (TID) | ORAL | Status: DC
  Administered 2017-07-10 – 2017-07-15 (×17): 30 g via ORAL
  Filled 2017-07-09 (×17): qty 60

## 2017-07-09 MED ORDER — LEVETIRACETAM 500 MG PO TABS
1500.0000 mg | ORAL_TABLET | Freq: Two times a day (BID) | ORAL | Status: DC
  Administered 2017-07-09 – 2017-07-15 (×12): 1500 mg via ORAL
  Filled 2017-07-09 (×12): qty 3

## 2017-07-09 MED ORDER — ACETAMINOPHEN 325 MG PO TABS: 650.0000 mg | ORAL_TABLET | Freq: Four times a day (QID) | ORAL | Status: DC | PRN

## 2017-07-09 MED ORDER — FOLIC ACID 1 MG PO TABS
1.0000 mg | ORAL_TABLET | Freq: Every day | ORAL | Status: DC
  Administered 2017-07-10 – 2017-07-15 (×6): 1 mg via ORAL
  Filled 2017-07-09 (×8): qty 1

## 2017-07-09 NOTE — ED Notes (Signed)
BEHAVIORAL HEALTH ROUNDING Patient sleeping: Yes.   Patient alert and oriented: eyes closed  Appears to be asleep Behavior appropriate: Yes.  ; If no, describe:  Nutrition and fluids offered: Yes  Toileting and hygiene offered: sleeping Sitter present: q 15 minute observations and security monitoring Law enforcement present: yes  ODS 

## 2017-07-09 NOTE — ED Notes (Signed)
Meal provided along with a diet ginger ale  - NAD assessed  Evening meds administered as ordered  Plan of care discussed including the plan for admission after change of shift this evening  Continue to monitor

## 2017-07-09 NOTE — ED Notes (Signed)
Patient observed lying in bed with eyes closed  Even, unlabored respirations observed   NAD pt appears to be sleeping  I will continue to monitor along with every 15 minute visual observations and ongoing security monitoring    

## 2017-07-09 NOTE — ED Notes (Signed)
Attempted report x1. 

## 2017-07-09 NOTE — ED Notes (Signed)
BEHAVIORAL HEALTH ROUNDING Patient sleeping: No. Patient alert and oriented: yes Behavior appropriate: Yes.  ; If no, describe:  Nutrition and fluids offered: yes Toileting and hygiene offered: Yes  Sitter present: q15 minute observations and security  monitoring Law enforcement present: Yes  ODS  

## 2017-07-09 NOTE — [facility] Group Notes (Signed)
[facility] Group Notes:  (Nursing/MHT/Case Management/Adjunct)  Date:  07/09/2017  Time:  9:21 PM  Type of Therapy:  Group Therapy  Participation Level:  Did Not Attend 07/09/2017, 9:21 PM

## 2017-07-09 NOTE — ED Notes (Signed)
Pt given meal tray.

## 2017-07-09 NOTE — ED Provider Notes (Signed)
Vitals:   07/08/17 2114 07/09/17 0610  BP: (!) 153/96 (!) 148/89  Pulse: 91 84  Resp: 16 15  Temp:  97.9 F (36.6 C)  SpO2: 93% 94%   No acute events reported to me overnight.  On IVC for suicidal ideation.  I reviewed patients note from psychiatrist Dr. Toni Amendlapacs -- Librium was added, plan is for patient admission.     Governor RooksLord, Merit Maybee, MD 07/09/17 [phone]

## 2017-07-09 NOTE — Consult Note (Signed)
[place] [facility] Consult   Reason for Consult:  Consult for 36 year old man with a history of schizophrenia and alcohol abuse who comes in complaining of suicidal ideation Referring Physician:  Quentin Cornwall Patient Identification: Howard [name] MRN:  [id number] Principal Diagnosis: Paranoid schizophrenia [facility]) Diagnosis:   Patient Active Problem List   Diagnosis Date Noted  . Schizophrenia, paranoid ([place]) [F20.0] 06/09/2017  . PTSD (post-traumatic stress disorder) [F43.10] 06/08/2017  . Inflamed external hemorrhoid [K64.4] 05/13/2017  . End stage liver disease ([place]) [K72.90] 04/24/2017  . Esophageal varices (HCC) [I85.00]   . Hematemesis [K92.0] 04/05/2017  . Upper GI bleed [K92.2]   . Iron deficiency anemia due to chronic blood loss [D50.0] 02/16/2017  . Nausea [R11.0] 01/18/2017  . Respiratory failure with hypoxia ([place]) [J96.91] 12/14/2016  . Seizures ([place]) [R56.9] 12/12/2016  . DNR (do not resuscitate) discussion [Z71.89] 08/11/2016  . Muscle weakness (generalized) [M62.81]   . GI bleed [K92.2] 08/07/2016  . OSA (obstructive sleep apnea) [G47.33] 04/29/2016  . Anemia [D64.9] 04/29/2016  . Thrombocytopenia ([place]) [D69.6] 04/29/2016  . Coagulopathy ([place]) [D68.9] 04/29/2016  . Obesity [E66.9] 04/29/2016  . Controlled type 2 diabetes mellitus without complication ([place]) [Z00.9] 01/15/2016  . Essential (primary) hypertension [I10] 12/11/2015  . Pure hypercholesterolemia [E78.00] 12/11/2015  . Hepatic encephalopathy ([place]) [K72.90] 10/16/2015  . Alcoholic cirrhosis of liver with ascites ([place]) [K70.31] 07/19/2015  . Elevated transaminase level [R74.0] 07/04/2015  . Alcohol abuse [F10.10] 05/31/2015  . Paranoid schizophrenia ([place]) [F20.0] 03/20/2015    Total Time spent with patient: 30 minutes  Subjective:   Howard [name] is a 36 y.o. male patient admitted with "the voices are getting so loud I can't stand it".  Follow-up for Thursday the 18th.  Patient remains in  the emergency room overnight because of lack of staffing downstairs.  On interview today the patient says he is feeling even worse.  Hallucinations are very loud.  Also says that he is having visual hallucinations which is a rare symptom for him.  He is very flat in his affect and says that he has intrusive thoughts about hurting himself although he has not done anything dangerous in the emergency room.  HPI:  36 year old man with long-standing mental health problems. Diagnosis of schizophrenia. Also more recent history of alcohol abuse with severe complications and likely PTSD. Patient is known to me from the outpatient clinic. He was recently discharged from the psychiatric unit and at that time appeared to be doing well but after going home he continued to drink and has continued to have serious medical problems. Despite reported compliance with clozapine he says that his auditory hallucinations have grown increasingly loud. They are telling him to kill himself. Patient feels sad and overwhelmed all the time. Can't sleep well. No homicidal ideation. Very sad and hopeless mood.  Social history: Patient lives at home with his mother. Not able to work. Reasonably good support from his family.  Medical history: Overweight but also now has diagnosis of cirrhosis related to his heavy alcohol use. Consequently has esophageal varices and bleeding problems. Additionally mild but controlled diabetes and high blood pressure.  Substance abuse history: Patient had of later onset of alcohol problem but has now been drinking very heavily for the last several years. Despite substance abuse treatment and attempts to engage him in appropriate therapy and the patient stated goal of stopping drinking he has been unable to get it under control at home. He does not have a history of delirium tremens. Unclear  if he has had withdrawal seizures in the past. Does not abuse other drugs.  Past Psychiatric History: Patient has had  prior hospitalizations but had gone for several years without them until recently. Recent hospitalization was marked by a transition in his medicine changing him over to clozapine. He appeared to show significant improvement in the hospital but has decompensated after going home again which I think is probably largely the result of his alcohol abuse. Does have a past history of suicidality no history of violence.  Risk to Self: Suicidal Ideation: No Suicidal Intent: No Is patient at risk for suicide?: Yes Suicidal Plan?: No Access to Means: Yes What has been your use of drugs/alcohol within the last 12 months?: Alcohol How many times?: 0 Other Self Harm Risks: Alcohol use  Triggers for Past Attempts: Hallucinations, Family contact Intentional Self Injurious Behavior: Cutting Comment - Self Injurious Behavior: Last cut over a year ago Risk to Others: Homicidal Ideation: No Thoughts of Harm to Others: No Current Homicidal Intent: No Current Homicidal Plan: No Access to Homicidal Means: No Identified Victim: Reports of none History of harm to others?: No Assessment of Violence: None Noted Violent Behavior Description: Reports of none Does patient have access to weapons?: No Criminal Charges Pending?: No Does patient have a court date: No Prior Inpatient Therapy: Prior Inpatient Therapy: Yes Prior Therapy Dates: 05/2017, 05/2015 & 12/2014 Prior Therapy Facilty/Provider(s): [facility]  Reason for Treatment: Schizophrenia and Depression Prior Outpatient Therapy: Prior Outpatient Therapy: Yes Prior Therapy Dates: Current Prior Therapy Facilty/Provider(s): [facility] Reason for Treatment: Schizophrenia and Depression  Does patient have an ACCT team?: No Does patient have Intensive In-House Services?  : No Does patient have [facility] services? : No Does patient have P4CC services?: No  Past Medical History:  Past Medical History:  Diagnosis Date  . Alcoholic cirrhosis  of liver with ascites ([place])   . Alcoholism ([place])   . Anemia   . Atrial fibrillation ([place])   . COPD (chronic obstructive pulmonary disease) ([place])   . Depression   . Diabetes mellitus, type II ([place])   . Esophageal varices ([place])   . Heart disease    irregular heart beat (palpitations) and heart murmur  . Hyperlipemia   . Hypertension   . Liver disease   . Multiple thyroid nodules   . Portal hypertensive gastropathy ([place])   . Schizophrenia ([place])   . Seizures ([place])     Past Surgical History:  Procedure Laterality Date  . ESOPHAGOGASTRODUODENOSCOPY N/A 10/05/2015   Procedure: ESOPHAGOGASTRODUODENOSCOPY (EGD);  Surgeon: Josefine Class, MD;  Location[facility] ENDOSCOPY;  Service: Endoscopy;  Laterality: N/A;  . ESOPHAGOGASTRODUODENOSCOPY N/A 06/30/2017   Procedure: ESOPHAGOGASTRODUODENOSCOPY (EGD);  Surgeon: Lin Landsman, MD;  Location[facility] ENDOSCOPY;  Service: Gastroenterology;  Laterality: N/A;  . ESOPHAGOGASTRODUODENOSCOPY (EGD) WITH PROPOFOL N/A 08/03/2015   Procedure: ESOPHAGOGASTRODUODENOSCOPY (EGD) WITH PROPOFOL;  Surgeon: Josefine Class, MD;  Location: [facility] ENDOSCOPY;  Service: Endoscopy;  Laterality: N/A;  . ESOPHAGOGASTRODUODENOSCOPY (EGD) WITH PROPOFOL N/A 08/31/2015   Procedure: ESOPHAGOGASTRODUODENOSCOPY (EGD) WITH PROPOFOL;  Surgeon: Josefine Class, MD;  Location: [facility] ENDOSCOPY;  Service: Endoscopy;  Laterality: N/A;  . ESOPHAGOGASTRODUODENOSCOPY (EGD) WITH PROPOFOL N/A 04/04/2016   Procedure: ESOPHAGOGASTRODUODENOSCOPY (EGD) WITH PROPOFOL;  Surgeon: Manya Silvas, MD;  Location: [facility] ENDOSCOPY;  Service: Endoscopy;  Laterality: N/A;  . ESOPHAGOGASTRODUODENOSCOPY (EGD) WITH PROPOFOL N/A 11/13/2016   Procedure: ESOPHAGOGASTRODUODENOSCOPY (EGD) WITH PROPOFOL;  Surgeon: Jonathon Bellows, MD;  Location: [facility] ENDOSCOPY;  Service: Endoscopy;  Laterality: N/A;  . ESOPHAGOGASTRODUODENOSCOPY (EGD) WITH PROPOFOL N/A 04/07/2017   Procedure: ESOPHAGOGASTRODUODENOSCOPY (EGD)  WITH PROPOFOL;  Surgeon: Lucilla Lame, MD;  Location: [facility] ENDOSCOPY;  Service: Endoscopy;  Laterality: N/A;  . NO PAST SURGERIES     Family History:  Family History  Problem Relation Age of Onset  . Heart disease Mother   . Hypertension Mother   . Hyperlipidemia Mother   . Stroke Father   . Heart attack Father   . Hypertension Father   . Heart disease Father   . Alcohol abuse Father   . Heart disease Brother    Family Psychiatric  History: Positive for mood disorder and possible alcohol use Social History:  History  Alcohol Use  . 0.0 oz/week    Comment: last drink 1 days ago (noted: 05/30/2017)     History  Drug Use No    Social History   Social History  . Marital status: Single    Spouse name: N/A  . Number of children: N/A  . Years of education: N/A   Occupational History  . disabled    Social History Main Topics  . Smoking status: Never Smoker  . Smokeless tobacco: Never Used  . Alcohol use 0.0 oz/week     Comment: last drink 1 days ago (noted: 05/30/2017)  . Drug use: No  . Sexual activity: No   Other Topics Concern  . None   Social History Narrative   ** Merged History Encounter **       Additional Social History:    Allergies:   Allergies  Allergen Reactions  . Tramadol Itching    Labs:  Results for orders placed or performed during the hospital encounter of 07/08/17 (from the past 48 hour(s))  Urine Drug Screen, Qualitative     Status: Abnormal   Collection Time: 07/08/17  1:40 PM  Result Value Ref Range   Tricyclic, Ur Screen NONE DETECTED NONE DETECTED   Amphetamines, Ur Screen NONE DETECTED NONE DETECTED   MDMA (Ecstasy)Ur Screen NONE DETECTED NONE DETECTED   Cocaine Metabolite,Ur [place] NONE DETECTED NONE DETECTED   Opiate, Ur Screen NONE DETECTED NONE DETECTED   Phencyclidine (PCP) Ur S NONE DETECTED NONE DETECTED   Cannabinoid 50 Ng, Ur [place] NONE DETECTED NONE DETECTED   Barbiturates, Ur Screen NONE DETECTED NONE DETECTED    Benzodiazepine, Ur Scrn POSITIVE (A) NONE DETECTED   Methadone Scn, Ur NONE DETECTED NONE DETECTED    Comment: (NOTE) 741  Tricyclics, urine               Cutoff 1000 ng/mL 200  Amphetamines, urine             Cutoff 1000 ng/mL 300  MDMA (Ecstasy), urine           Cutoff 500 ng/mL 400  Cocaine Metabolite, urine       Cutoff 300 ng/mL 500  Opiate, urine                   Cutoff 300 ng/mL 600  Phencyclidine (PCP), urine      Cutoff 25 ng/mL 700  Cannabinoid, urine              Cutoff 50 ng/mL 800  Barbiturates, urine             Cutoff 200 ng/mL 900  Benzodiazepine, urine           Cutoff 200 ng/mL 1000 Methadone, urine  Cutoff 300 ng/mL 1100 1200 The urine drug screen provides only a preliminary, unconfirmed 1300 analytical test result and should not be used for non-medical 1400 purposes. Clinical consideration and professional judgment should 1500 be applied to any positive drug screen result due to possible 1600 interfering substances. A more specific alternate chemical method 1700 must be used in order to obtain a confirmed analytical result.  1800 Gas chromato graphy / mass spectrometry (GC/MS) is the preferred 1900 confirmatory method.   Comprehensive metabolic panel     Status: Abnormal   Collection Time: 07/08/17  1:41 PM  Result Value Ref Range   Sodium 141 135 - 145 mmol/L   Potassium 3.9 3.5 - 5.1 mmol/L   Chloride 103 101 - 111 mmol/L   CO2 31 22 - 32 mmol/L   Glucose, Bld 226 (H) 65 - 99 mg/dL   BUN 7 6 - 20 mg/dL   Creatinine, Ser 0.83 0.61 - 1.24 mg/dL   Calcium 8.7 (L) 8.9 - 10.3 mg/dL   Total Protein 7.4 6.5 - 8.1 g/dL   Albumin 3.4 (L) 3.5 - 5.0 g/dL   AST 46 (H) 15 - 41 U/L   ALT 43 17 - 63 U/L   Alkaline Phosphatase 104 38 - 126 U/L   Total Bilirubin 0.9 0.3 - 1.2 mg/dL   GFR calc non Af Amer >60 >60 mL/min   GFR calc Af Amer >60 >60 mL/min    Comment: (NOTE) The eGFR has been calculated using the CKD EPI equation. This calculation has not  been validated in all clinical situations. eGFR's persistently <60 mL/min signify possible Chronic Kidney Disease.    Anion gap 7 5 - 15  Ethanol     Status: None   Collection Time: 07/08/17  1:41 PM  Result Value Ref Range   Alcohol, Ethyl (B) <10 <10 mg/dL    Comment:        LOWEST DETECTABLE LIMIT FOR SERUM ALCOHOL IS 10 mg/dL FOR MEDICAL PURPOSES ONLY   Salicylate level     Status: None   Collection Time: 07/08/17  1:41 PM  Result Value Ref Range   Salicylate Lvl <0.9 2.8 - 30.0 mg/dL  Acetaminophen level     Status: Abnormal   Collection Time: 07/08/17  1:41 PM  Result Value Ref Range   Acetaminophen (Tylenol), Serum <10 (L) 10 - 30 ug/mL    Comment:        THERAPEUTIC CONCENTRATIONS VARY SIGNIFICANTLY. A RANGE OF 10-30 ug/mL MAY BE AN EFFECTIVE CONCENTRATION FOR MANY PATIENTS. HOWEVER, SOME ARE BEST TREATED AT CONCENTRATIONS OUTSIDE THIS RANGE. ACETAMINOPHEN CONCENTRATIONS >150 ug/mL AT 4 HOURS AFTER INGESTION AND >50 ug/mL AT 12 HOURS AFTER INGESTION ARE OFTEN ASSOCIATED WITH TOXIC REACTIONS.   cbc     Status: Abnormal   Collection Time: 07/08/17  1:41 PM  Result Value Ref Range   WBC 6.4 3.8 - 10.6 K/uL   RBC 3.77 (L) 4.40 - 5.90 MIL/uL   Hemoglobin 11.4 (L) 13.0 - 18.0 g/dL   HCT 34.6 (L) 40.0 - 52.0 %   MCV 91.8 80.0 - 100.0 fL   MCH 30.2 26.0 - 34.0 pg   MCHC 32.9 32.0 - 36.0 g/dL   RDW 15.1 (H) 11.5 - 14.5 %   Platelets 126 (L) 150 - 440 K/uL  Differential     Status: None   Collection Time: 07/08/17  1:41 PM  Result Value Ref Range   Neutrophils Relative % 74 %   Neutro Abs 5.0  1.4 - 6.5 K/uL   Lymphocytes Relative 15 %   Lymphs Abs 1.0 1.0 - 3.6 K/uL   Monocytes Relative 8 %   Monocytes Absolute 0.5 0.2 - 1.0 K/uL   Eosinophils Relative 3 %   Eosinophils Absolute 0.2 0 - 0.7 K/uL   Basophils Relative 0 %   Basophils Absolute 0.0 0 - 0.1 K/uL  Glucose, capillary     Status: Abnormal   Collection Time: 07/08/17  4:20 PM  Result Value Ref  Range   Glucose-Capillary 157 (H) 65 - 99 mg/dL    Current Facility-Administered Medications  Medication Dose Route Frequency Provider Last Rate Last Dose  . albuterol (PROVENTIL HFA;VENTOLIN HFA) 108 (90 Base) MCG/ACT inhaler 2 puff  2 puff Inhalation Q4H PRN [name], [name], MD      . cloZAPine (CLOZARIL) tablet 150 mg  150 mg Oral QHS [name], [name], MD      . ferrous sulfate tablet 325 mg  325 mg Oral Q breakfast [name], Madie Reno, MD   325 mg at 07/09/17 1208  . folic acid (FOLVITE) tablet 1 mg  1 mg Oral Daily [name], [name], MD   1 mg at 07/09/17 1224  . lactulose (CHRONULAC) 10 GM/15ML solution 30 g  30 g Oral TID [name], Madie Reno, MD   30 g at 07/09/17 1225  . levETIRAcetam (KEPPRA) tablet 1,500 mg  1,500 mg Oral BID [name], Madie Reno, MD   1,500 mg at 07/09/17 1208  . losartan (COZAAR) tablet 25 mg  25 mg Oral Daily [name], Madie Reno, MD   25 mg at 07/09/17 1222  . metFORMIN (GLUCOPHAGE) tablet 500 mg  500 mg Oral BID WC [name], Madie Reno, MD   500 mg at 07/09/17 1224  . nadolol (CORGARD) tablet 20 mg  20 mg Oral Daily [name], Madie Reno, MD   20 mg at 07/09/17 1223  . pantoprazole (PROTONIX) EC tablet 40 mg  40 mg Oral Daily [name], Madie Reno, MD   40 mg at 07/09/17 1226  . perphenazine (TRILAFON) tablet 4 mg  4 mg Oral TID [name], [name], MD      . sucralfate (CARAFATE) tablet 1 g  1 g Oral TID WC & HS [name], Madie Reno, MD   1 g at 07/09/17 1223  . thiamine (VITAMIN B-1) tablet 100 mg  100 mg Oral Daily [name], Madie Reno, MD   100 mg at 07/09/17 1226   Current Outpatient Prescriptions  Medication Sig Dispense Refill  . albuterol (PROVENTIL HFA;VENTOLIN HFA) 108 (90 Base) MCG/ACT inhaler Inhale 2 puffs into the lungs every 4 (four) hours as needed for wheezing or shortness of breath. 1 Inhaler 3  . cloZAPine (CLOZARIL) 50 MG tablet Take 3 tablets (150 mg total) by mouth at bedtime. 90 tablet 3  . ferrous sulfate 325 (65 FE) MG tablet Take 1 tablet (325 mg total) by mouth daily with breakfast. 30  tablet 3  . folic acid (FOLVITE) 1 MG tablet Take 1 tablet (1 mg total) by mouth daily. 30 tablet 3  . hydrOXYzine (ATARAX/VISTARIL) 25 MG tablet Take 1 tablet (25 mg total) by mouth 3 (three) times daily as needed for anxiety. 30 tablet 3  . ipratropium-albuterol (DUONEB) 0.5-2.5 (3) MG/3ML SOLN Take 3 mLs by nebulization every 6 (six) hours. 360 mL 1  . lactulose (CHRONULAC) 10 GM/15ML solution Take 67.5 mLs (45 g total) by mouth 3 (three) times daily. 240 mL 3  . levETIRAcetam (KEPPRA) 750 MG tablet  Take 2 tablets (1,500 mg total) by mouth 2 (two) times daily. 120 tablet 3  . LORazepam (ATIVAN) 0.5 MG tablet Take 0.5 mg by mouth every 4 (four) hours as needed for anxiety.    Marland Kitchen losartan (COZAAR) 25 MG tablet Take 1 tablet (25 mg total) by mouth daily. 30 tablet 3  . metFORMIN (GLUCOPHAGE) 500 MG tablet Take 1 tablet (500 mg total) by mouth 2 (two) times daily with a meal. 60 tablet 3  . Morphine Sulfate (MORPHINE CONCENTRATE) 10 mg / 0.5 ml concentrated solution Take 0.5 mg by mouth every 2 (two) hours as needed for severe pain or shortness of breath.    . nadolol (CORGARD) 20 MG tablet Take 1 tablet (20 mg total) by mouth daily. 30 tablet 3  . pantoprazole (PROTONIX) 40 MG tablet Take 1 tablet (40 mg total) by mouth daily. 30 tablet 3  . prochlorperazine (COMPAZINE) 10 MG tablet Take 10 mg by mouth every 6 (six) hours as needed for nausea or vomiting (or migraine).    . rifaximin (XIFAXAN) 550 MG TABS tablet Take 1 tablet (550 mg total) by mouth 2 (two) times daily. 60 tablet 3  . sucralfate (CARAFATE) 1 g tablet Take 1 tablet (1 g total) by mouth 4 (four) times daily -  with meals and at bedtime. 120 tablet 3  . thiamine 100 MG tablet Take 1 tablet (100 mg total) by mouth daily. 30 tablet 3  . traZODone (DESYREL) 100 MG tablet Take 100 mg by mouth at bedtime as needed for sleep.      Musculoskeletal: Strength & Muscle Tone: within normal limits Gait & Station: normal Patient leans:  N/A  Psychiatric Specialty Exam: Physical Exam  Nursing note and vitals reviewed. Constitutional: He appears well-developed and well-nourished.  HENT:  Head: Normocephalic and atraumatic.  Eyes: Pupils are equal, round, and reactive to light. Conjunctivae are normal.  Neck: Normal range of motion.  Cardiovascular: Regular rhythm and normal heart sounds.   Respiratory: Effort normal. No respiratory distress.  GI: Soft.  Musculoskeletal: Normal range of motion.  Neurological: He is alert.  Skin: Skin is warm and dry.  Psychiatric: His mood appears anxious. His speech is delayed. He is slowed and actively hallucinating. Thought content is paranoid. He expresses impulsivity. He exhibits a depressed mood. He expresses suicidal ideation. He exhibits abnormal recent memory.    Review of Systems  Constitutional: Positive for malaise/fatigue.  HENT: Negative.   Eyes: Negative.   Respiratory: Negative.   Cardiovascular: Negative.   Gastrointestinal: Positive for abdominal pain.  Musculoskeletal: Negative.   Skin: Negative.   Neurological: Negative.   Psychiatric/Behavioral: Positive for depression, hallucinations, memory loss, substance abuse and suicidal ideas. The patient is nervous/anxious and has insomnia.     Blood pressure (!) 141/83, pulse 78, temperature 97.8 F (36.6 C), temperature source Oral, resp. rate 16, weight (!) 330 lb (149.7 kg), SpO2 96 %.Body mass index is 41.25 kg/m.  General Appearance: Disheveled  Eye Contact:  Fair  Speech:  Slow  Volume:  Decreased  Mood:  Depressed  Affect:  Depressed  Thought Process:  Goal Directed  Orientation:  Full (Time, Place, and Person)  Thought Content:  Logical, Rumination and Tangential  Suicidal Thoughts:  Yes.  with intent/plan  Homicidal Thoughts:  No  Memory:  Immediate;   Fair Recent;   Fair Remote;   Fair  Judgement:  Fair  Insight:  Fair  Psychomotor Activity:  Decreased  Concentration:  Concentration: Fair  Recall:  Smiley Houseman of Knowledge:  Fair  Language:  Fair  Akathisia:  No  Handed:  Right  AIMS (if indicated):     Assets:  Desire for Improvement Housing  ADL's:  Intact  Cognition:  Impaired,  Mild  Sleep:        Treatment Plan Summary: Daily contact with patient to assess and evaluate symptoms and progress in treatment, Medication management and Plan Supportive therapy and review of the patient's symptoms.  I acknowledged with him how distressing the symptoms are.  I reviewed his medicine and it appears that for what ever reason I had only put him on 50 mg of clozapine last night.  The correct dose should be 150 mg.  I have updated that order.  Also added Trilafon 4 mg 3 times a day for antipsychotic treatment.  No other change to medication.  Follow-up with continued plan for admission  Disposition: Recommend psychiatric Inpatient admission when medically cleared. Supportive therapy provided about ongoing stressors.  Alethia Berthold, MD 07/09/2017 5:32 PM

## 2017-07-09 NOTE — ED Notes (Signed)
ED Is the patient under IVC or is there intent for IVC: Yes.   Is the patient medically cleared: Yes.   Is there vacancy in the ED BHU: Yes.   Is the population mix appropriate for patient: Yes.   Is the patient awaiting placement in inpatient or outpatient setting: Yes.   - awaiting inpt bed to be assigned   Has the patient had a psychiatric consult: Yes.   Survey of unit performed for contraband, proper placement and condition of furniture, tampering with fixtures in bathroom, shower, and each patient room: Yes.  ; Findings:  APPEARANCE/BEHAVIOR Calm and cooperative NEURO ASSESSMENT Orientation: oriented x3  Denies pain Hallucinations:  He verbalizes auditory and visual hallucinations  Speech: Normal Gait: normal - ambulates with a walker most of the time RESPIRATORY ASSESSMENT Even  Unlabored respirations  CARDIOVASCULAR ASSESSMENT Pulses equal   regular rate  Skin warm and dry   GASTROINTESTINAL ASSESSMENT no GI complaint EXTREMITIES Full ROM  PLAN OF CARE Provide calm/safe environment. Vital signs assessed twice daily. ED BHU Assessment once each 12-hour shift. Collaborate with TTS if available or as condition indicates. Assure the ED provider has rounded once each shift. Provide and encourage hygiene. Provide redirection as needed. Assess for escalating behavior; address immediately and inform ED provider.  Assess family dynamic and appropriateness for visitation as needed: Yes.  ; If necessary, describe findings:  Educate the patient/family about BHU procedures/visitation: Yes.  ; If necessary, describe findings:

## 2017-07-09 NOTE — BH Assessment (Signed)
[facility] Toni Amendlapacs.  Attending Physician will be Dr. Johnella MoloneyMcNew.   [facility] [place].   ER staff is aware of the admission Salley Scarlet( Nitchia, ER Sect.; Dr. Alphonzo LemmingsMcShane, ER MD; & Mertie ClauseJeanelle Patient Access).  Clinician unable to reach pt RN (Amy) at this time. Secretary states she will Engineer, agriculturalinform RN.

## 2017-07-09 NOTE — ED Notes (Signed)

## 2017-07-09 NOTE — ED Notes (Signed)

## 2017-07-10 DIAGNOSIS — F203 Undifferentiated schizophrenia: Principal | ICD-10-CM

## 2017-07-10 MED ORDER — CLOZAPINE 25 MG PO TABS
150.0000 mg | ORAL_TABLET | Freq: Every day | ORAL | Status: DC
  Administered 2017-07-10 – 2017-07-14 (×5): 150 mg via ORAL
  Filled 2017-07-10: qty 1
  Filled 2017-07-10: qty 2
  Filled 2017-07-10: qty 1
  Filled 2017-07-10: qty 2
  Filled 2017-07-10: qty 1

## 2017-07-10 MED ORDER — PERPHENAZINE 4 MG PO TABS
4.0000 mg | ORAL_TABLET | Freq: Two times a day (BID) | ORAL | Status: DC
  Administered 2017-07-10: 4 mg via ORAL
  Filled 2017-07-10: qty 1

## 2017-07-10 MED ORDER — HYDROXYZINE HCL 50 MG PO TABS
50.0000 mg | ORAL_TABLET | Freq: Three times a day (TID) | ORAL | Status: DC | PRN
  Administered 2017-07-10 – 2017-07-11 (×2): 50 mg via ORAL
  Filled 2017-07-10 (×2): qty 1

## 2017-07-10 MED ORDER — BENZTROPINE MESYLATE 1 MG PO TABS: 1.0000 mg | ORAL_TABLET | Freq: Two times a day (BID) | ORAL | Status: DC | PRN

## 2017-07-10 MED ORDER — PERPHENAZINE 4 MG PO TABS
2.0000 mg | ORAL_TABLET | Freq: Two times a day (BID) | ORAL | Status: DC
  Administered 2017-07-11 – 2017-07-15 (×9): 2 mg via ORAL
  Filled 2017-07-10 (×9): qty 1

## 2017-07-10 MED ORDER — CHLORDIAZEPOXIDE HCL 25 MG PO CAPS: 25.0000 mg | ORAL_CAPSULE | Freq: Four times a day (QID) | ORAL | Status: DC | PRN
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
## 2017-07-10 NOTE — Tx Team (Signed)
Interdisciplinary Treatment and Diagnostic Plan Update  07/10/2017 Time of Session: [place] MRN: [id number]  Principal Diagnosis: <principal problem not specified>  Secondary Diagnoses: Active Problems:   Schizophrenia, undifferentiated ([place])   Current Medications:  Current Facility-Administered Medications  Medication Dose Route Frequency Provider Last Rate Last Dose  . acetaminophen (TYLENOL) tablet 650 mg  650 mg Oral Q6H PRN [name], [name], MD      . albuterol (PROVENTIL HFA;VENTOLIN HFA) 108 (90 Base) MCG/ACT inhaler 2 puff  2 puff Inhalation Q4H PRN [name], [name], MD      . alum & mag hydroxide-simeth (MAALOX/MYLANTA) 200-200-20 MG/5ML suspension 30 mL  30 mL Oral Q4H PRN [name], [name], MD      . chlordiazePOXIDE (LIBRIUM) capsule 25 mg  25 mg Oral Q12H [name], [name], MD   25 mg at 07/10/17 0100  . ferrous sulfate tablet 325 mg  325 mg Oral Q breakfast [name], Madie Reno, MD   325 mg at 07/10/17 0829  . folic acid (FOLVITE) tablet 1 mg  1 mg Oral Daily [name], Madie Reno, MD   1 mg at 07/10/17 0828  . lactulose (CHRONULAC) 10 GM/15ML solution 30 g  30 g Oral TID [name], Madie Reno, MD   30 g at 07/10/17 0827  . levETIRAcetam (KEPPRA) tablet 1,500 mg  1,500 mg Oral BID [name], Madie Reno, MD   1,500 mg at 07/10/17 0829  . losartan (COZAAR) tablet 25 mg  25 mg Oral Daily [name], Madie Reno, MD   25 mg at 07/10/17 0829  . magnesium hydroxide (MILK OF MAGNESIA) suspension 30 mL  30 mL Oral Daily PRN [name], [name], MD      . metFORMIN (GLUCOPHAGE) tablet 500 mg  500 mg Oral BID WC [name], [name], MD   500 mg at 07/10/17 0829  . nadolol (CORGARD) tablet 20 mg  20 mg Oral Daily [name], Madie Reno, MD   20 mg at 07/10/17 0829  . pantoprazole (PROTONIX) EC tablet 40 mg  40 mg Oral Daily [name], Madie Reno, MD   40 mg at 07/10/17 0829  . sucralfate (CARAFATE) tablet 1 g  1 g Oral TID WC & HS [name], Madie Reno, MD   1 g at 07/10/17 7619  . thiamine (VITAMIN B-1) tablet 100 mg  100 mg Oral  Daily [name], Madie Reno, MD   100 mg at 07/10/17 5093   PTA Medications: Prescriptions Prior to Admission  Medication Sig Dispense Refill Last Dose  . albuterol (PROVENTIL HFA;VENTOLIN HFA) 108 (90 Base) MCG/ACT inhaler Inhale 2 puffs into the lungs every 4 (four) hours as needed for wheezing or shortness of breath. 1 Inhaler 3 Taking  . cloZAPine (CLOZARIL) 50 MG tablet Take 3 tablets (150 mg total) by mouth at bedtime. 90 tablet 3 Taking  . ferrous sulfate 325 (65 FE) MG tablet Take 1 tablet (325 mg total) by mouth daily with breakfast. 30 tablet 3 Taking  . folic acid (FOLVITE) 1 MG tablet Take 1 tablet (1 mg total) by mouth daily. 30 tablet 3 Taking  . hydrOXYzine (ATARAX/VISTARIL) 25 MG tablet Take 1 tablet (25 mg total) by mouth 3 (three) times daily as needed for anxiety. 30 tablet 3 Taking  . ipratropium-albuterol (DUONEB) 0.5-2.5 (3) MG/3ML SOLN Take 3 mLs by nebulization every 6 (six) hours. 360 mL 1 Taking  . lactulose (CHRONULAC) 10 GM/15ML solution Take 67.5 mLs (45 g total) by mouth 3 (three) times daily. 240 mL 3 Taking  .  levETIRAcetam (KEPPRA) 750 MG tablet Take 2 tablets (1,500 mg total) by mouth 2 (two) times daily. 120 tablet 3 Taking  . LORazepam (ATIVAN) 0.5 MG tablet Take 0.5 mg by mouth every 4 (four) hours as needed for anxiety.   Taking  . losartan (COZAAR) 25 MG tablet Take 1 tablet (25 mg total) by mouth daily. 30 tablet 3 Taking  . metFORMIN (GLUCOPHAGE) 500 MG tablet Take 1 tablet (500 mg total) by mouth 2 (two) times daily with a meal. 60 tablet 3 Taking  . Morphine Sulfate (MORPHINE CONCENTRATE) 10 mg / 0.5 ml concentrated solution Take 0.5 mg by mouth every 2 (two) hours as needed for severe pain or shortness of breath.   Taking  . nadolol (CORGARD) 20 MG tablet Take 1 tablet (20 mg total) by mouth daily. 30 tablet 3 Taking  . pantoprazole (PROTONIX) 40 MG tablet Take 1 tablet (40 mg total) by mouth daily. 30 tablet 3 Taking  . prochlorperazine (COMPAZINE) 10 MG  tablet Take 10 mg by mouth every 6 (six) hours as needed for nausea or vomiting (or migraine).   Taking  . rifaximin (XIFAXAN) 550 MG TABS tablet Take 1 tablet (550 mg total) by mouth 2 (two) times daily. 60 tablet 3 Taking  . sucralfate (CARAFATE) 1 g tablet Take 1 tablet (1 g total) by mouth 4 (four) times daily -  with meals and at bedtime. 120 tablet 3 Taking  . thiamine 100 MG tablet Take 1 tablet (100 mg total) by mouth daily. 30 tablet 3 Taking  . traZODone (DESYREL) 100 MG tablet Take 100 mg by mouth at bedtime as needed for sleep.   Taking    Patient Stressors: Health problems Medication change or noncompliance Substance abuse  Patient Strengths: Motivation for treatment/growth Supportive family/friends  Treatment Modalities: Medication Management, Group therapy, Case management,  1 to 1 session with clinician, Psychoeducation, Recreational therapy.   Physician Treatment Plan for Primary Diagnosis: <principal problem not specified> Long Term Goal(s):     Short Term Goals:    Medication Management: Evaluate patient's response, side effects, and tolerance of medication regimen.  Therapeutic Interventions: 1 to 1 sessions, Unit Group sessions and Medication administration.  Evaluation of Outcomes: Not Met  Physician Treatment Plan for Secondary Diagnosis: Active Problems:   Schizophrenia, undifferentiated ([place])  Long Term Goal(s):     Short Term Goals:       Medication Management: Evaluate patient's response, side effects, and tolerance of medication regimen.  Therapeutic Interventions: 1 to 1 sessions, Unit Group sessions and Medication administration.  Evaluation of Outcomes: Not Met   RN Treatment Plan for Primary Diagnosis: <principal problem not specified> Long Term Goal(s): Knowledge of disease and therapeutic regimen to maintain health will improve  Short Term Goals: Ability to identify and develop effective coping behaviors will improve and Compliance with  prescribed medications will improve  Medication Management: RN will administer medications as ordered by provider, will assess and evaluate patient's response and provide education to patient for prescribed medication. RN will report any adverse and/or side effects to prescribing provider.  Therapeutic Interventions: 1 on 1 counseling sessions, Psychoeducation, Medication administration, Evaluate responses to treatment, Monitor vital signs and CBGs as ordered, Perform/monitor CIWA, COWS, AIMS and Fall Risk screenings as ordered, Perform wound care treatments as ordered.  Evaluation of Outcomes: Not Met   LCSW Treatment Plan for Primary Diagnosis: <principal problem not specified> Long Term Goal(s): Safe transition to appropriate next level of care at discharge, Engage patient in  therapeutic group addressing interpersonal concerns.  Short Term Goals: Engage patient in aftercare planning with referrals and resources, Facilitate patient progression through stages of change regarding substance use diagnoses and concerns and Increase skills for wellness and recovery  Therapeutic Interventions: Assess for all discharge needs, 1 to 1 time with Social worker, Explore available resources and support systems, Assess for adequacy in community support network, Educate family and significant other(s) on suicide prevention, Complete Psychosocial Assessment, Interpersonal group therapy.  Evaluation of Outcomes: Not Met   Progress in Treatment: Attending groups: No. Participating in groups: No. Taking medication as prescribed: Yes. Toleration medication: Yes. Family/Significant other contact made: No, will contact:  when given permission Patient understands diagnosis: Yes. Discussing patient identified problems/goals with staff: Yes. Medical problems stabilized or resolved: Yes. Denies suicidal/homicidal ideation: Yes. Issues/concerns per patient self-inventory: No. Other: none  New problem(s)  identified: No, Describe:  none  New Short Term/Long Term Goal(s):Pt goal is "to get better."  Discharge Plan or Barriers: Pt will continue services with Dr Weber Cooks and potentially return to [place] at [facility].  Reason for Continuation of Hospitalization: Hallucinations Medication stabilization  Estimated Length of Stay:3-5 days.  Attendees: Patient:Howard [name] 07/10/2017   Physician: Dr. Wonda Olds 07/10/2017   Nursing: Elige Radon, RN 07/10/2017   RN Care Manager: 07/10/2017   Social Worker: Lurline Idol, LCSW 07/10/2017   Recreational Therapist:  07/10/2017   Other:  07/10/2017   Other:  07/10/2017   Other: 07/10/2017     Scribe for Treatment Team: Joanne Chars, [place] 07/10/2017 11:06 AM

## 2017-07-10 NOTE — Progress Notes (Signed)
Pharmacy Clozapine Monitoring  36 y/o M with clozapine ordered clozapine.   ANC= 5000  Results reported to clozapine registry. Next labs 10/26.   Luisa HartScott Tulani Kidney, PharmD Clinical Pharmacist

## 2017-07-10 NOTE — Consult Note (Signed)
Consult: I am not working as Howard [name]'s assigned inpatient psychiatrist for this admission but I spoke with Dr.McNew and with nursing and stopped by to check on Howard [name] while I was on the unit. I am optimistic that he will be feeling better soon. I did make the suggestion to the patient and the treatment team that he consider a different living arrangement at discharge this time. Howard [name] is actually seeming to be open to that

## 2017-07-10 NOTE — [facility] Suicide Risk Assessment (Signed)
[facility]   Nursing information obtained from:  Patient Demographic factors:  Male Current Mental Status:   (reports AH "telling me to kill myself) Loss Factors:  Financial problems / change in socioeconomic status Historical Factors:  NA Risk Reduction Factors:  Positive social support  Total Time spent with patient: 1 hour Principal Problem: Schizophrenia, undifferentiated (HCC) Diagnosis:   Patient Active Problem List   Diagnosis Date Noted  . 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 Schizophrenia, paranoid (HCC) [F20.0] 06/09/2017  . PTSD (post-traumatic stress disorder) [F43.10] 06/08/2017  . Inflamed external hemorrhoid [K64.4] 05/13/2017  . End stage liver disease (HCC) [K72.90] 04/24/2017  . Esophageal varices (HCC) [I85.00]   . Hematemesis [K92.0] 04/05/2017  . Upper GI bleed [K92.2]   . Iron deficiency anemia due to chronic blood loss [D50.0] 02/16/2017  . Nausea [R11.0] 01/18/2017  . Respiratory failure with hypoxia (HCC) [J96.91] 12/14/2016  . Seizures (HCC) [R56.9] 12/12/2016  . DNR (do not resuscitate) discussion [Z71.89] 08/11/2016  . Muscle weakness (generalized) [M62.81]   . GI bleed [K92.2] 08/07/2016  . OSA (obstructive sleep apnea) [G47.33] 04/29/2016  . Anemia [D64.9] 04/29/2016  . Thrombocytopenia (HCC) [D69.6] 04/29/2016  . Coagulopathy (HCC) [D68.9] 04/29/2016  . Obesity [E66.9] 04/29/2016  . Controlled type 2 diabetes mellitus without complication (HCC) [E11.9] 01/15/2016  . Essential (primary) hypertension [I10] 12/11/2015  . Pure hypercholesterolemia [E78.00] 12/11/2015  . Hepatic encephalopathy (HCC) [K72.90] 10/16/2015  . 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 Elevated transaminase level [R74.0] 07/04/2015  . Alcohol abuse [F10.10] 05/31/2015  . Paranoid schizophrenia (HCC) [F20.0] 03/20/2015   Subjective Data: AH, VH, SI  Continued Clinical Symptoms:  Alcohol Use  Disorder Identification Test Final Score (AUDIT): 5 The "Alcohol Use Disorders Identification Test", Guidelines for Use in Primary Care, Second Edition.  World Science writerHealth Organization [facility](WHO). Score between 0-7:  no or low risk or alcohol related problems. Score between 8-15:  moderate risk of alcohol related problems. Score between 16-19:  high risk of alcohol related problems. Score 20 or above:  warrants further diagnostic evaluation for alcohol dependence and treatment.   CLINICAL FACTORS:   Alcohol/Substance Abuse/Dependencies   Musculoskeletal: Strength & Muscle Tone: within normal limits Gait & Station: normal Patient leans: N/A  Psychiatric Specialty Exam: Physical Exam  ROS  Blood pressure 137/79, pulse 88, temperature 98.4 F (36.9 C), temperature source Oral, resp. rate 18, height 6\' 3"  (1.905 m), weight (!) 153.3 kg (338 lb).Body mass index is 42.25 kg/m.    General Appearance: Casual  Eye Contact:  Fair  Speech:  Slow  Volume:  Decreased  Mood:  Depressed  Affect:  Blunt  Thought Process:  Goal Directed  Orientation:  Full (Time, Place, and Person)  Thought Content:  Hallucinations: Auditory Visual  Suicidal Thoughts:  Yes.  without intent/plan  Homicidal Thoughts:  No  Memory:  Immediate;   Fair  Judgement:  Fair  Insight:  Fair  Psychomotor Activity:  Psychomotor Retardation  Concentration:  Concentration: Fair  Recall:  [employer]  Fund of Knowledge:  Fair  Language:  Fair  Akathisia:  No  Handed:  Right  AIMS (if indicated):     Assets:  Communication Skills Desire for Improvement Housing  ADL's:  Intact  Cognition:  WNL  Sleep:  Number of Hours: 7.3     COGNITIVE FEATURES THAT CONTRIBUTE TO RISK:  Polarized thinking    SUICIDE RISK:   Moderate:  Frequent suicidal ideation with limited intensity,  and duration, some specificity in terms of plans, no associated intent, good self-control, limited dysphoria/symptomatology, some risk factors present, and  identifiable protective factors, including available and accessible social support.  PLAN OF CARE:  -Clozapine 150 mg and adjust as needed and tolerated  I certify that inpatient services furnished can reasonably be expected to improve the patient's condition.   Haskell Riling, MD 07/10/2017, 1:48 PM

## 2017-07-10 NOTE — Tx Team (Signed)
Initial Treatment Plan 07/10/2017 1:57 AM Howard [name] WUJ:[id number]    PATIENT STRESSORS: Health problems Medication change or noncompliance Substance abuse   PATIENT STRENGTHS: Motivation for treatment/growth Supportive family/friends   PATIENT IDENTIFIED PROBLEMS:   Auditory and visual hallucinations  Chronic medical problems                 DISCHARGE CRITERIA:  Improved stabilization in mood, thinking, and/or behavior Medical problems require only outpatient monitoring Verbal commitment to aftercare and medication compliance  PRELIMINARY DISCHARGE PLAN: Outpatient therapy Return to previous living arrangement  PATIENT/FAMILY INVOLVEMENT: This treatment plan has been presented to and reviewed with the patient, Howard [name].  The patient has been given the opportunity to ask questions and make suggestions.  Milon ScoreValinda Mckennah Kretchmer, RN 07/10/2017, 1:57 AM

## 2017-07-10 NOTE — Progress Notes (Signed)
Patient arrived on the unit at 2040 accompanied by security and ER staff.  Patient was observed ambulating with a walker and wearing yellow socks for fall risk.  He is also an elopement risk and has a history of seizures.  Patient was pleasant and cooperative during interview.  Skin assessment was negative for contraband.  Patient has a large  red area on outer aspect of right calf and a half dollar size red area on anterior left shin.  He reports "poor circulation".  He has no belongings and is wearing scrubs.  He states he is admitted "because the voices are telling me to kill myself" and "sometimes I see shadows coming at me".  Patient states he hears the voices during our discussion. He denies SI and HI.  He contracts for safety. He had snack in the dayroom but went to bed by 2200.  He was given reassurance that he is safe and he agreed to let staff know if he is feeling unsafe.

## 2017-07-10 NOTE — [facility] Suicide Risk Assessment (Addendum)
[facility] INPATIENT:  Family/Significant Other Suicide Prevention Education  Suicide Prevention Education:  Contact Attempts: Jamesetta GeraldsBetty Stammen, mother, [phone], has been identified by the patient as the family member/significant other with whom the patient will be residing, and identified as the person(s) who will aid the patient in the event of a mental health crisis.  With written consent from the patient, two attempts were made to provide suicide prevention education, prior to and/or following the patient's discharge.  We were unsuccessful in providing suicide prevention education.  A suicide education pamphlet was given to the patient to share with family/significant other.  Date and time of first attempt:07/10/17, [id number] Date and time of second attempt: 07/12/17, 0903; no voicemail available to be left  Vernie ShanksLauren Reyce Lubeck, LCSW Clinical Social Work 07/12/2017 9:03 AM

## 2017-07-10 NOTE — [facility] Group Notes (Signed)
[facility] Group Notes:  (Nursing/MHT/Case Management/Adjunct)  Date:  07/10/2017  Time:  10:50 AM  Type of Therapy:  Psychoeducational Skills  Participation Level:  Did Not Attend  Howard [name] 07/10/2017, 10:50 AM 

## 2017-07-10 NOTE — [facility] Group Notes (Signed)
[facility] Group Notes:  (Nursing/MHT/Case Management/Adjunct)  Date:  07/10/2017  Time:  9:29 PM  Type of Therapy:  Evening Wrap-up Group  Participation Level:  Minimal  Participation Quality:  Limited  Affect:  Flat  Cognitive:  Delayed  Insight:  None  Engagement in Group:  Limited  Modes of Intervention:  Discussion  Summary of Progress/Problems:  Tomasita MorrowChelsea Nanta Rylynn Schoneman 07/10/2017, 9:29 PM

## 2017-07-10 NOTE — Progress Notes (Signed)
Received Howard [name] this am after brreakfast, He is walking with his walker.He endorsed feeling depressed and anxious this am. He endorsed hearing voices directing him to kill hisself and visual image of a half man and half goat. This image has horns protruding from his head and fire coming  from his eyes. He was compliant with his medications. He was encouraged to attend as many group sessions as possible.

## 2017-07-10 NOTE — Progress Notes (Signed)
In bed at the beginning of the shift. Encouraged to come out for group and snack. Ambulated with walker without incident. States he is having CAH to harm himself but that he has no intention to harm himself at this time. Contracts for safety. CIWA score is negative at 2200. Some spontaneous smile this evening, was med compliant. No physical complaints. States he is in hospice at home. Remains on routine obs for safety.

## 2017-07-10 NOTE — [facility] Group Notes (Signed)
LCSW Group Therapy Note  07/10/2017 1:15pm  Type of Therapy/Topic:  Group Therapy:  Feelings about Diagnosis  Participation Level:  Did Not Attend   Description of Group:   This group will allow patients to explore their thoughts and feelings about diagnoses they have received. Patients will be guided to explore their level of understanding and acceptance of these diagnoses. Facilitator will encourage patients to process their thoughts and feelings about the reactions of others to their diagnosis and will guide patients in identifying ways to discuss their diagnosis with significant others in their lives. This group will be process-oriented, with patients participating in exploration of their own experiences, giving and receiving support, and processing challenge from other group members.   Therapeutic Goals: 1. Patient will demonstrate understanding of diagnosis as evidenced by identifying two or more symptoms of the disorder 2. Patient will be able to express two feelings regarding the diagnosis 3. Patient will demonstrate their ability to communicate their needs through discussion and/or role play  Summary of Patient Progress:       Therapeutic Modalities:   Cognitive Behavioral Therapy Brief Therapy Feelings Identification    Rmani Kellogg C Duran Ohern, LCSW 07/10/2017 3:04 PM  

## 2017-07-10 NOTE — Plan of Care (Signed)
Problem: Coping: Goal: Ability to cope will improve Outcome: Not Progressing Patient complains of ongoing anxiety and requesting medication for same.  MD informed and medication given.
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
## 2017-07-11 NOTE — [facility] Group Notes (Signed)
LCSW Group Therapy Note  07/11/2017 1:00pm  Type of Therapy and Topic:  Group Therapy:  Cognitive Distortions  Participation Level:  Did Not Attend   Description of Group:    Patients in this group will be introduced to the topic of cognitive distortions.  Patients will identify and describe cognitive distortions, describe the feelings these distortions create for them.  Patients will identify one or more situations in their personal life where they have cognitively distorted thinking and will verbalize challenging this cognitive distortion through positive thinking skills.  Patients will practice the skill of using positive affirmations to challenge cognitive distortions using affirmation cards.    Therapeutic Goals:  1. Patient will identify two or more cognitive distortions they have used 2. Patient will identify one or more emotions that stem from use of a cognitive distortion 3. Patient will demonstrate use of a positive affirmation to counter a cognitive distortion through discussion and/or role play. 4. Patient will describe one way cognitive distortions can be detrimental to wellness   Summary of Patient Progress:     Therapeutic Modalities:   Cognitive Behavioral Therapy Motivational Interviewing   Glennon MacSara P Kem Hensen, LCSW 07/11/2017 3:18 PM

## 2017-07-11 NOTE — Progress Notes (Addendum)
D: Patient is corporative, well tempered with periods of anxiety. Patient reports that he is having auditory hallucinations but reports no SI at this time. Patient stated that he slept well last night, stated appetite is good, patient reports low energy level but is noted to be making laps around the nurses station with walker and without. CIWA score <10. Patient is compliant with medications.  A: Patient educated about medication and CIWA protocol, and informed about unit activities and groups.   R: Patient verbalized understanding of medication. Patient regularly comes out of room to walk.

## 2017-07-11 NOTE — Progress Notes (Signed)
D: Patient  Stayed closed to the bed this shift . Affect flat and depressed mood .  Noted on phone talking to his family . Patient walking  With a walker  Voice concerns around anxiety, received medication  Patient stated sleptfair last night .Stated appetite is good and energy level  Is normal. Stated concentration poor. Stated on Depression scale 7, hopeless 4  and anxiety 8 .( low 0-10 high) Denies suicidal  homicidal ideations  .  No auditory hallucinations  No pain concerns . Appropriate ADL'S. Interacting with peers and staff. Voice of his headache feeling better A: Encourage patient participation with unit programming . Instruction  Given on  Medication , verbalize understanding. R: Voice no other concerns. Staff continue to monitor

## 2017-07-12 NOTE — Progress Notes (Signed)
Patient up ad lib with slow steady gait. Continues to ambulate with his walker. Denies SI/HI/AVH and verbally contracts for safety. Compliant with his medications and meals. Complained of feeling anxious in group settings but verbalized that he would try to attend group today. Denies pain at this time, but complained of headache earlier this morning. Reports that energy level is low, concentration poor, appetite is good and depression is an 8. Will continue to monitor patient  and report acute conditions to MD.

## 2017-07-12 NOTE — Plan of Care (Signed)
Problem: Coping: Goal: Ability to cope will improve Outcome: Progressing Patient is socializing with peers and increased participation in activities  Problem: Education: Goal: Knowledge of the prescribed therapeutic regimen will improve Outcome: Progressing Patient is taking his medications and participating in health care assessments

## 2017-07-12 NOTE — Progress Notes (Signed)
Pleasant on contact with some spontaneous smiles. Visible in the milieu occasionally. Attended wrap up and ate a snack. Was med compliant. Denies SI, HI or depression. States the voices are no longer commands to kill himself. They just call him names as they always have. Continues to see shadows 2-3 times an hour and states that is greatly decreased. Was med compliant then went to bed. Appears to be asleep at this time. Remains on routine obs for safety. No physical complaints.

## 2017-07-12 NOTE — [facility] Group Notes (Signed)
[facility] Group Notes:  (Nursing/MHT/Case Management/Adjunct)  Date:  07/12/2017  Time:  7:21 AM  Type of Therapy:  Psychoeducational Skills  Participation Level:  Did Not Attend  Howard MilroyLaquanda Y Aaro [name] 07/12/2017, 7:21 AM

## 2017-07-12 NOTE — [facility] Group Notes (Signed)
[facility] LCSW Group Therapy 07/12/2017 1:15pm  Type of Therapy: Group Therapy- Feelings Around Discharge & Establishing a Supportive Framework  Participation Level:  Did Not Attend  Description of Group:   What is a supportive framework? What does it look like feel like and how do I discern it from and unhealthy non-supportive network? Learn how to cope when supports are not helpful and don't support you. Discuss what to do when your family/friends are not supportive.   Therapeutic Modalities:   Cognitive Behavioral Therapy Person-Centered Therapy Motivational Interviewing   Verdene LennertLauren C Kaori Jumper, LCSW 07/12/2017 2:00 PM
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
## 2017-07-12 NOTE — Plan of Care (Signed)
Problem: Safety: Goal: Ability to disclose and discuss suicidal ideas will improve Outcome: Progressing Patient verbally denies the thoughts of self harm. Verbally contracts for safety.  Problem: Education: Goal: Will be free of psychotic symptoms Outcome: Not Progressing Patient continues to verbalize the uneasy feeling of being in a group setting.  Goal: Knowledge of the prescribed therapeutic regimen will improve Outcome: Progressing Patient is knowledgeable of his regimen.  Problem: Coping: Goal: Ability to cope will improve Outcome: Not Progressing Although patient verbalizes the fear of being in a group setting, patient stated that he would attend the session. Goal: Ability to verbalize feelings will improve Outcome: Progressing Patient verbalizes his feelings appropriately.  Problem: Health Behavior/Discharge Planning: Goal: Compliance with prescribed medication regimen will improve Outcome: Progressing Patient is compliant with his medication regimen.  Problem: Safety: Goal: Ability to remain free from injury will improve Outcome: Progressing Patient remains free from injury.  Problem: Physical Regulation: Goal: Will remain free from infection Outcome: Progressing Patient remains free from injury.

## 2017-07-13 ENCOUNTER — Telehealth: Payer: Self-pay

## 2017-07-13 LAB — CLOZAPINE (CLOZARIL)
Clozapine Lvl: 435 ng/mL (ref 350–650)
NORCLOZAPINE: 183 ng/mL
Total(Cloz+Norcloz): 618 ng/mL

## 2017-07-13 NOTE — Progress Notes (Signed)
Alerted of pt's Bps, see note by Debroah BallerJessie Meadows, requested manual which was 99/62.  Pt felt dizzy early but reported relief of symptoms with water and gatorade.  Requested hold losartan and nadolol and close observation of pt, pt will be seen by MD this morning for further evaluation.

## 2017-07-13 NOTE — [facility] Group Notes (Signed)
[facility] Group Notes:  (Nursing/MHT/Case Management/Adjunct)  Date:  07/13/2017  Time:  12:11 AM  Type of Therapy:  Psychoeducational Skills  Participation Level:  Minimal  Participation Quality:  Appropriate  Affect:  Flat  Cognitive:  Confused  Insight:  None  Engagement in Group:  Limited  Modes of Intervention:  Discussion, Socialization and Support  Summary of Progress/Problems:  Howard [name]  Howard [name] 07/13/2017, 12:11 AM

## 2017-07-13 NOTE — Progress Notes (Signed)
Patient up ad lib with slow steady gait. Continues to ambulate with four wheeled walker. Affect pleasant and cooperative, compliant with medications and meals. BP monitored this shift due to hypotensive episode earlier this morning. PO fluids encouraged. Denies SI/HI/AVH. Will continue to monitor.

## 2017-07-13 NOTE — Progress Notes (Signed)
[facility] MD Progress Note  07/13/2017 2:22 PM Howard [name]  MRN:  [id number]   Subjective:  Pt states that he is improving day by day. He states that the [facility] are "much quieter." He states that they voices have never really gone away completely but he is hopeful that this new medications will help that. He did have an episode of hypotension this morning and BP meds were held. Pt states that he was dehydrated and feels much better now. He denies feeling dizzy or lightheaded. Pt's affect does appear much brighter, with better eye contact and more conversant. He is able laugh and smile during interview appropriately. He states that the voices are not telling him to kill himself today so far but over the weekend he did hear these voices. He states that he still has VH of "figures" but is not longer seeing a demon. We did discuss option of referral to group home and how this could be beneficial for him to have support. However, he states that he has given that a lot of thought but wants to stay at home to help take care of his mother. He states that he has been going to groups today and has been thinking about things more. He states that he was able to admit to the group that he has a drinking problem which is usually hard for him to do. He states that the reason why he wasn't going to substance groups is because it conflicts with  hospice care that comes to his house 3 times a week. He states that he is going to start going to [employer] again which he used to do. He wants to go 3 times a week and states that the meeting he goes to is in [place] at 545 pm.  Pt is organized and goal directed in conversation today.   Principal Problem: Schizophrenia, undifferentiated (HCC) Diagnosis:   Patient Active Problem List   Diagnosis Date Noted  . 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 Schizophrenia, paranoid (HCC) [F20.0] 06/09/2017  . PTSD (post-traumatic stress disorder) [F43.10] 06/08/2017  .  Inflamed external hemorrhoid [K64.4] 05/13/2017  . End stage liver disease (HCC) [K72.90] 04/24/2017  . Esophageal varices (HCC) [I85.00]   . Hematemesis [K92.0] 04/05/2017  . Upper GI bleed [K92.2]   . Iron deficiency anemia due to chronic blood loss [D50.0] 02/16/2017  . Nausea [R11.0] 01/18/2017  . Respiratory failure with hypoxia (HCC) [J96.91] 12/14/2016  . Seizures (HCC) [R56.9] 12/12/2016  . DNR (do not resuscitate) discussion [Z71.89] 08/11/2016  . Muscle weakness (generalized) [M62.81]   . GI bleed [K92.2] 08/07/2016  . OSA (obstructive sleep apnea) [G47.33] 04/29/2016  . Anemia [D64.9] 04/29/2016  . Thrombocytopenia (HCC) [D69.6] 04/29/2016  . Coagulopathy (HCC) [D68.9] 04/29/2016  . Obesity [E66.9] 04/29/2016  . Controlled type 2 diabetes mellitus without complication (HCC) [E11.9] 01/15/2016  . Essential (primary) hypertension [I10] 12/11/2015  . Pure hypercholesterolemia [E78.00] 12/11/2015  . Hepatic encephalopathy (HCC) [K72.90] 10/16/2015  . 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 Elevated transaminase level [R74.0] 07/04/2015  . Alcohol abuse [F10.10] 05/31/2015  . Paranoid schizophrenia (HCC) [F20.0] 03/20/2015   Total Time spent with patient: 20 minutes  Past Psychiatric History: See H&P  Past Medical History:  Past Medical History:  Diagnosis Date  . Alcoholic cirrhosis of liver with ascites (HCC)   . Alcoholism (HCC)   . Anemia   . Atrial fibrillation (HCC)   . COPD (chronic obstructive pulmonary disease) (HCC)   .  Depression   . Diabetes mellitus, type II (HCC)   . Esophageal varices (HCC)   . Heart disease    irregular heart beat (palpitations) and heart murmur  . Hyperlipemia   . Hypertension   . Liver disease   . Multiple thyroid nodules   . Portal hypertensive gastropathy (HCC)   . Schizophrenia (HCC)   . Seizures (HCC)     Past Surgical History:  Procedure Laterality Date  . ESOPHAGOGASTRODUODENOSCOPY N/A  10/05/2015   Procedure: ESOPHAGOGASTRODUODENOSCOPY (EGD);  Surgeon: Elnita Maxwell, MD;  Location: [facility] ENDOSCOPY;  Service: Endoscopy;  Laterality: N/A;  . ESOPHAGOGASTRODUODENOSCOPY N/A 06/30/2017   Procedure: ESOPHAGOGASTRODUODENOSCOPY (EGD);  Surgeon: Toney Reil, MD;  Location: [facility] ENDOSCOPY;  Service: Gastroenterology;  Laterality: N/A;  . ESOPHAGOGASTRODUODENOSCOPY (EGD) WITH PROPOFOL N/A 08/03/2015   Procedure: ESOPHAGOGASTRODUODENOSCOPY (EGD) WITH PROPOFOL;  Surgeon: Elnita Maxwell, MD;  Location: [facility] ENDOSCOPY;  Service: Endoscopy;  Laterality: N/A;  . ESOPHAGOGASTRODUODENOSCOPY (EGD) WITH PROPOFOL N/A 08/31/2015   Procedure: ESOPHAGOGASTRODUODENOSCOPY (EGD) WITH PROPOFOL;  Surgeon: Elnita Maxwell, MD;  Location: [facility] ENDOSCOPY;  Service: Endoscopy;  Laterality: N/A;  . ESOPHAGOGASTRODUODENOSCOPY (EGD) WITH PROPOFOL N/A 04/04/2016   Procedure: ESOPHAGOGASTRODUODENOSCOPY (EGD) WITH PROPOFOL;  Surgeon: Scot Jun, MD;  Location: [facility] ENDOSCOPY;  Service: Endoscopy;  Laterality: N/A;  . ESOPHAGOGASTRODUODENOSCOPY (EGD) WITH PROPOFOL N/A 11/13/2016   Procedure: ESOPHAGOGASTRODUODENOSCOPY (EGD) WITH PROPOFOL;  Surgeon: Wyline Mood, MD;  Location: [facility] ENDOSCOPY;  Service: Endoscopy;  Laterality: N/A;  . ESOPHAGOGASTRODUODENOSCOPY (EGD) WITH PROPOFOL N/A 04/07/2017   Procedure: ESOPHAGOGASTRODUODENOSCOPY (EGD) WITH PROPOFOL;  Surgeon: Midge Minium, MD;  Location: [facility] ENDOSCOPY;  Service: Endoscopy;  Laterality: N/A;  . NO PAST SURGERIES     Family History:  Family History  Problem Relation Age of Onset  . Heart disease Mother   . Hypertension Mother   . Hyperlipidemia Mother   . Stroke Father   . Heart attack Father   . Hypertension Father   . Heart disease Father   . Alcohol abuse Father   . Heart disease Brother    Family Psychiatric  History: See H&P Social History:  History  Alcohol Use  . 0.0 oz/week    Comment: last drink 1 days ago (noted:  05/30/2017)     History  Drug Use No    Social History   Social History  . Marital status: Single    Spouse name: N/A  . Number of children: N/A  . Years of education: N/A   Occupational History  . disabled    Social History Main Topics  . Smoking status: Never Smoker  . Smokeless tobacco: Never Used  . Alcohol use 0.0 oz/week     Comment: last drink 1 days ago (noted: 05/30/2017)  . Drug use: No  . Sexual activity: No   Other Topics Concern  . None   Social History Narrative   ** Merged History Encounter **        Sleep: Good  Appetite:  Good  Current Medications: Current Facility-Administered Medications  Medication Dose Route Frequency Provider Last Rate Last Dose  . albuterol (PROVENTIL HFA;VENTOLIN HFA) 108 (90 Base) MCG/ACT inhaler 2 puff  2 puff Inhalation Q4H PRN [name], [name], MD      . alum & mag hydroxide-simeth (MAALOX/MYLANTA) 200-200-20 MG/5ML suspension 30 mL  30 mL Oral Q4H PRN [name], [name], MD      . benztropine (COGENTIN) tablet 1 mg  1 mg Oral BID PRN Taiesha Bovard, Ileene Hutchinson, MD      .  chlordiazePOXIDE (LIBRIUM) capsule 25 mg  25 mg Oral Q6H PRN Damire Remedios R, MD      . cloZAPine (CLOZARIL) tablet 150 mg  150 mg Oral QHS Illyana Schorsch, Ileene Hutchinson, MD   150 mg at 07/12/17 2109  . ferrous sulfate tablet 325 mg  325 mg Oral Q breakfast [name], [name], MD   325 mg at 07/13/17 0759  . folic acid (FOLVITE) tablet 1 mg  1 mg Oral Daily [name], [name], MD   1 mg at 07/13/17 0759  . hydrOXYzine (ATARAX/VISTARIL) tablet 50 mg  50 mg Oral TID PRN Haskell Riling, MD   50 mg at 07/11/17 1421  . lactulose (CHRONULAC) 10 GM/15ML solution 30 g  30 g Oral TID [name], [name], MD   30 g at 07/13/17 1140  . levETIRAcetam (KEPPRA) tablet 1,500 mg  1,500 mg Oral BID [name], [name], MD   1,500 mg at 07/13/17 0759  . losartan (COZAAR) tablet 25 mg  25 mg Oral Daily Haskell Riling, MD   Stopped at 07/13/17 [phone]  . magnesium hydroxide (MILK OF MAGNESIA) suspension 30 mL  30 mL Oral  Daily PRN [name], [name], MD      . metFORMIN (GLUCOPHAGE) tablet 500 mg  500 mg Oral BID WC [name], [name], MD   500 mg at 07/13/17 0759  . nadolol (CORGARD) tablet 20 mg  20 mg Oral Daily Tullio Chausse, Ileene Hutchinson, MD   Stopped at 07/13/17 [phone]  . pantoprazole (PROTONIX) EC tablet 40 mg  40 mg Oral Daily [name], [name], MD   40 mg at 07/13/17 0759  . perphenazine (TRILAFON) tablet 2 mg  2 mg Oral BID Jarvis Knodel, Ileene Hutchinson, MD   2 mg at 07/13/17 0800  . sucralfate (CARAFATE) tablet 1 g  1 g Oral TID WC & HS [name], [name], MD   1 g at 07/13/17 1141  . thiamine (VITAMIN B-1) tablet 100 mg  100 mg Oral Daily [name], [name], MD   100 mg at 07/13/17 2542    Lab Results: No results found for this or any previous visit (from the past 48 hour(s)).  Blood Alcohol level:  Lab Results  Component Value Date   ETH <10 07/08/2017   ETH <5 06/08/2017    Metabolic Disorder Labs: Lab Results  Component Value Date   HGBA1C 5.7 (H) 06/10/2017   MPG 116.89 06/10/2017   MPG 151 12/12/2016   No results found for: PROLACTIN Lab Results  Component Value Date   CHOL 199 06/10/2017   TRIG 97 06/10/2017   HDL 37 (L) 06/10/2017   CHOLHDL 5.4 06/10/2017   VLDL 19 06/10/2017   LDLCALC 143 (H) 06/10/2017   LDLCALC 121 (H) 01/19/2017    Physical Findings: AIMS:  , ,  ,  ,    CIWA:  CIWA-Ar Total: 1 COWS:     Musculoskeletal: Strength & Muscle Tone: within normal limits Gait & Station: Uses walker Patient leans: N/A  Psychiatric Specialty Exam: Physical Exam  Nursing note and vitals reviewed.   Review of Systems  All other systems reviewed and are negative.   Blood pressure 116/71, pulse (!) 102, temperature 98.1 F (36.7 C), resp. rate 18, height 6\' 3"  (1.905 m), weight (!) 153.3 kg (338 lb).Body mass index is 42.25 kg/m.  General Appearance: Fairly Groomed  Eye Contact:  Good  Speech:  Clear and Coherent  Volume:  Normal  Mood:  Depressed but improving  Affect:  Constricted  but brighter today   Thought Process:  Goal Directed  Orientation:  Full (Time, Place, and Person)  Thought Content:  Hallucinations: Auditory Visual  Suicidal Thoughts:  No  Homicidal Thoughts:  No  Memory:  Fair  Judgement:  Fair  Insight:  Fair  Psychomotor Activity:  Normal  Concentration:  Concentration: Fair  Recall:  Fair  Fund of Knowledge:  Fair  Language:  Fair  Akathisia:  No    AIMS (if indicated):   0  Assets:  Communication Skills Housing Resilience  ADL's:  Intact  Cognition:  WNL  Sleep:  Number of Hours: 7.45     Treatment Plan Summary:  36 yo male with history of schizophrenia presents due to worsening AH, VH, and SI. Pt is a patient of Dr. Toni Amendlapacs outpatient. Pt has had severe alcohol abuse over the past few years leading to developed of cirrhosis of the liver. Pt was recently inpatient and started on Clozapine with improvement in symptoms. Pt reports stress at home which may have led to worsening of symptoms. He does not appear overtly psychotic at this time and AH are improving. Discussed case with Dr. Renaldo Fiddlerlapecs who felt he may benefit from a group home due to severe alcohol use and better support for him. However, pt not interested in this and wants to continue living with his mother and brother.  Worsening of symptoms may be due to his continued alcohol use (although he states that he is drinking much less-likely minimizing). Symptoms are improving with addition of perphenazine. Pt appears much brighter today. AIMS 0.   Plan:  #Schizophrenia -Continue 150 mg qhs. Will adjust if needed but has been stable at this dose -Will check Clozapine level. -Weekly ANC -Continue Trilifan 2 mg BID  #HTN -Continue Nadolol and losartan but hold dose parameters placed due to hypotension this am -Encouraged fluids for hypotension  #DM -Metformin 500 mg BID -  #Cirrhosis -Lactulose, Ferrous sulfate  #Seizure disorder -Keppra 1500 mg BID  #Alcohol Abuse -CIWA has been low,  will discontinue  Haskell RilingHolly R Zyanya Glaza, MD 07/13/2017, 2:22 PM

## 2017-07-13 NOTE — Progress Notes (Signed)
BP this am is 99/62 sitting at 0645 and 89/45. Pt BP see flowsheet running low at 0615. gatoraide and h20 given. Pt was dizzy earlier but now is asymptomatic. Continues to take fluids. Dr Vilma PraderIsbell was notified. Ordered losartan to be held this am and continue to monitor. Pt is alert and and oriented and o2 sats are 95-97%. Remains in the day room with staff.

## 2017-07-13 NOTE — Telephone Encounter (Signed)
latuda approved until 09-21-17

## 2017-07-13 NOTE — [facility] Group Notes (Signed)
LCSW Group Therapy Note   Time 1:00-145pm October 22nd, 2018  Type of Therapy and Topic: Group Therapy: Overcoming Obstacles   Participation Level- Fair/ Looked medicated  Description of Group:   In this group patients will be encouraged to explore what they see as obstacles to their own wellness and recovery. They will be guided to discuss their thoughts, feelings, and behaviors related to these obstacles. The group will process together ways to cope with barriers, with attention given to specific choices patients can make. Each patient will be challenged to identify changes they are motivated to make in order to overcome their obstacles. This group will be process-oriented, with patients participating in exploration of their own experiences, giving and receiving support, and processing challenge from other group members.  Therapeutic Goals:  1. Patient will identify personal and current obstacles as they relate to admission.  2. Patient will identify barriers that currently interfere with their wellness or overcoming obstacles.  3. Patient will identify feelings, thought process and behaviors related to these barriers.  4. Patient will identify two changes they are willing to make to overcome these obstacles:   Summary of Patient Progress  This patient was in group today but on the quiet side. He was not really engaged with any group members or discussions, however when asked a question or to contribute he was able to answer ( small delay) but remained on topic it appears he has good cognition skills. He reported health as an obstacle and that exercising could help a person get healthier and stable. His favorite ice cream is Chocolate. Therapeutic Modalities:  Cognitive Behavioral Therapy  Solution Focused Therapy  Motivational Interviewing  Relapse Prevention Therapy   Juliette Standre NorwoodBandi LCSW [phone]

## 2017-07-13 NOTE — Plan of Care (Signed)
Problem: Education: Goal: Will be free of psychotic symptoms Outcome: Progressing Patient's mental status is improving, denies SI/HI at this time. Goal: Knowledge of the prescribed therapeutic regimen will improve Outcome: Progressing Patient is aware of his current treatment regimen.  Problem: Coping: Goal: Ability to verbalize feelings will improve Outcome: Progressing Patient has the ability to verbalize feelings to staff.  Problem: Health Behavior/Discharge Planning: Goal: Compliance with prescribed medication regimen will improve Outcome: Progressing Patient is compliant with medications.  Problem: Safety: Goal: Ability to remain free from injury will improve Outcome: Progressing Patient remains free from injury at this time  Problem: Physical Regulation: Goal: Will remain free from infection Outcome: Progressing Patient remains free from injury while on the unit at this time.  Problem: Nutrition: Goal: Adequate nutrition will be maintained Outcome: Progressing Patient's nutritional status has remained adequate   Problem: Bowel/Gastric: Goal: Will not experience complications related to bowel motility Outcome: Progressing Patient reports having a BM daily.

## 2017-07-13 NOTE — [facility] Group Notes (Signed)
[facility] Group Notes:  (Nursing/MHT/Case Management/Adjunct)  Date:  07/13/2017  Time:  11:07 PM  Type of Therapy:  Psychoeducational Skills  Participation Level:  Minimal  Participation Quality:  Appropriate and Drowsy  Affect:  Appropriate and Flat  Cognitive:  Appropriate  Insight:  Appropriate  Engagement in Group:  Limited  Modes of Intervention:  Discussion, Socialization and Support  Summary of Progress/Problems:  Howard SingerJustin  Howard [name] 07/13/2017, 11:07 PM

## 2017-07-13 NOTE — [facility] Group Notes (Signed)
[facility] Group Notes:  (Nursing/MHT/Case Management/Adjunct)  Date:  07/13/2017  Time:  10:14 AM  Type of Therapy:  Psychoeducational Skills  Participation Level:  Minimal  Participation Quality:  Appropriate and Drowsy  Affect:  Appropriate and Flat  Cognitive:  Appropriate  Insight:  Appropriate and Limited  Engagement in Group:  Lacking and Limited  Modes of Intervention:  Discussion, Education, Socialization and Support  Summary of Progress/Problems:  Lynelle SmokeCara Travis Sharry Beining 07/13/2017, 10:14 AM

## 2017-07-14 NOTE — Progress Notes (Signed)
Patient ID: Howard [name], male   DOB: 12/30/1980, 36 y.o.   MRN: [id number] Initially, at the start of shift, in his room in bed, awake, "I am no longer hearing voices or seeing things because my medications are working.." Patient, later on, came to the day room and visible interacting well with peers. Ambulates with a walker.

## 2017-07-14 NOTE — Progress Notes (Signed)
CSW spoke with Howard [name] about discharge plan.  Howard [name] already has an appt with Dr Toni Amendlapacs.  CSW asked about counseling--Howard [name] had previously said he does not want to meet with his current therapist[facility] but today he says he is OK to continue meeting with her.  CSW talked with him about alcohol treatment.  Howard [name] reports he is going to attend AA meetings at a nearby church but he does not want to return to more formal treatment at this time. Garner NashGregory Chike Farrington, MSW, LCSW Clinical Social Worker 07/14/2017 11:40 AM
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
## 2017-07-14 NOTE — BHH Group Notes (Signed)
LCSW Group Therapy Note  07/14/2017 3:00pm  Type of Therapy/Topic:  Group Therapy:  Feelings about Diagnosis  Participation Level:  Minimal   Description of Group:   This group will allow patients to explore their thoughts and feelings about diagnoses they have received. Patients will be guided to explore their level of understanding and acceptance of these diagnoses. Facilitator will encourage patients to process their thoughts and feelings about the reactions of others to their diagnosis and will guide patients in identifying ways to discuss their diagnosis with significant others in their lives. This group will be process-oriented, with patients participating in exploration of their own experiences, giving and receiving support, and processing challenge from other group members.   Therapeutic Goals: 1. Patient will demonstrate understanding of diagnosis as evidenced by identifying two or more symptoms of the disorder 2. Patient will be able to express two feelings regarding the diagnosis 3. Patient will demonstrate their ability to communicate their needs through discussion and/or role play  Summary of Patient Progress:  Pt able to meet goals.  He is reserved, minimal interaction with others.  The rest of the group was a hyper verbal and difficult to participate.     Therapeutic Modalities:   Cognitive Behavioral Therapy Brief Therapy Feelings Identification    Glennon MacSara P Rashel Okeefe, LCSW 07/14/2017 3:51 PM

## 2017-07-14 NOTE — Plan of Care (Signed)
Problem: Activity: Goal: Sleeping patterns will improve Outcome: Progressing Patient slept for Estimated Hours of 8; Precautionary checks every 15 minutes for safety maintained, room free of safety hazards, patient sustains no injury or falls during this shift.    

## 2017-07-14 NOTE — Plan of Care (Signed)
Problem: Activity: Goal: Interest or engagement in leisure activities will improve Outcome: Progressing Attending unit programing   Problem: Education: Goal: Utilization of techniques to improve thought processes will improve Outcome: Progressing Attending unit programing

## 2017-07-14 NOTE — BHH Group Notes (Signed)
B[facility] Group Notes:  (Nursing/MHT/Case Management/Adjunct)  Date:  07/14/2017  Time:  9:17 PM  Type of Therapy:  Group Therapy  Participation Level:  Active  Participation Quality:  Appropriate  Affect:  Appropriate  Cognitive:  Appropriate  Insight:  Good  Engagement in Group:  Engaged  Modes of Intervention:  Activity  Summary of Progress/Problems:  Mayra NeerJackie L Yaser Harvill 07/14/2017, 9:17 PM

## 2017-07-14 NOTE — Progress Notes (Signed)
Affect plesant on approach attending unit programming . Abdomen  Distended  Continue to take the lactulose  For  Clearer Thoughts  Patient stated slept fair last night .Stated appetite is good and energy level  Iow. Stated concentration is poor. Stated on Depression scale , hopeless and anxiety .( low 0-10 high) Denies suicidal  homicidal ideations  .  No auditory hallucinations  No pain concerns . Appropriate ADL'S. Interacting with peers and staff.  A: Encourage patient participation with unit programming . Instruction  Given on  Medication , verbalize understanding. R: Voice no other concerns. Staff continue to monitor

## 2017-07-14 NOTE — BHH Group Notes (Signed)
Goals Group Date/Time: 07/14/2017 9:00 AM Type of Therapy and Topic: Group Therapy: Goals Group: SMART Goals   Participation Level: Moderate  Description of Group:    The purpose of a daily goals group is to assist and guide patients in setting recovery/wellness-related goals. The objective is to set goals as they relate to the crisis in which they were admitted. Patients will be using SMART goal modalities to set measurable goals. Characteristics of realistic goals will be discussed and patients will be assisted in setting and processing how one will reach their goal. Facilitator will also assist patients in applying interventions and coping skills learned in psycho-education groups to the SMART goal and process how one will achieve defined goal.   Therapeutic Goals:   -Patients will develop and document one goal related to or their crisis in which brought them into treatment.  -Patients will be guided by LCSW using SMART goal setting modality in how to set a measurable, attainable, realistic and time sensitive goal.  -Patients will process barriers in reaching goal.  -Patients will process interventions in how to overcome and successful in reaching goal.   Patient's Goal:Pt goal is to discharge and pt also talked about his goal of stopping his alcohol use.   Therapeutic Modalities:  Motivational Interviewing  Research officer, political partyCognitive Behavioral Therapy  Crisis Intervention Model  SMART goals setting   Daleen SquibbGreg Yael Angerer, KentuckyLCSW

## 2017-07-14 NOTE — Progress Notes (Signed)
Recreation Therapy Notes  Date: 10.23.18  Time: 9:30am  Location: Craft Room  Behavioral response: Disengaged  Intervention Topic - Problem solving  Intervention-  Group content on today was focused on problem solving. Patients benefited from learning ways to problem solve. They had the opportunity to discuss the factors that come into play when there is a problem. Everyone in the group expressed how they deal with a problems normally and gave some experiences of how they have problem solved in the past. The group had a chance to learn constructive healthy ways to problem solve. During the intervention the group used problem solving to think individually and described how they would react to certain situations.  Clinical Observations/Feedback:  Patient came to group but did not provide any contributions to the group discussion. Individual was disengaged from peers and staff, even though he was encouraged by staff to participate in group.   Elgene Coral LRT/CTRS        Kyia Rhude 07/14/2017 11:11 AM

## 2017-07-15 LAB — CBC WITH DIFFERENTIAL/PLATELET
BASOS ABS: 0.1 10*3/uL (ref 0–0.1)
BASOS PCT: 1 %
EOS PCT: 4 %
Eosinophils Absolute: 0.2 10*3/uL (ref 0–0.7)
HEMATOCRIT: 35.3 % — AB (ref 40.0–52.0)
Hemoglobin: 11.7 g/dL — ABNORMAL LOW (ref 13.0–18.0)
Lymphocytes Relative: 15 %
Lymphs Abs: 1 10*3/uL (ref 1.0–3.6)
MCH: 30.2 pg (ref 26.0–34.0)
MCHC: 33.1 g/dL (ref 32.0–36.0)
MCV: 91.5 fL (ref 80.0–100.0)
MONO ABS: 0.5 10*3/uL (ref 0.2–1.0)
MONOS PCT: 8 %
Neutro Abs: 4.5 10*3/uL (ref 1.4–6.5)
Neutrophils Relative %: 72 %
PLATELETS: 113 10*3/uL — AB (ref 150–440)
RBC: 3.86 MIL/uL — ABNORMAL LOW (ref 4.40–5.90)
RDW: 16.3 % — AB (ref 11.5–14.5)
WBC: 6.2 10*3/uL (ref 3.8–10.6)

## 2017-07-15 MED ORDER — PERPHENAZINE 2 MG PO TABS: 2.0000 mg | ORAL_TABLET | Freq: Two times a day (BID) | ORAL | 0 refills | Status: DC

## 2017-07-15 NOTE — Progress Notes (Signed)
D: Patient is aware of  Discharge this shift .Patient denies suicidal /homicidal ideations. Patient had  No  Belongings  A: No Storage medications. Writer reviewed Discharge Summary, Suicide Risk Assessment, and Transitional Record.  Prescriptions   from  MD were faxed to  Patient pharmacy  . Patient mother came for pick up , she brought clothing  For patient  R: Patient left unit with no questions  Or concerns  With mother

## 2017-07-15 NOTE — Progress Notes (Signed)
  [facility] :  Will you be returning to the same living situation after discharge:  Yes,  own home At discharge, do you have transportation home?: Yes,  family Do you have the ability to pay for your medications: Yes,  medicare/medicaid  Release of information consent forms completed and in the chart;  Patient's signature needed at discharge.  Patient to Follow up at: Follow-up Information    [facility]. Go on 07/21/2017.   Specialty:  Behavioral Health Why:  Please attend your follow up appointment with Dr. Toni Amendlapacs on Tuesday, 07/21/17, at 1:00pm.  Please bring a copy of your hospital discharge paperwork. Please attend your therapy appointment with Nolon RodNicole Peacock at 2:00pm on Tuesday, 08/04/17. Contact information: [address] [facility] [name] [id number] [phone]          Next level of care provider [facility]:yes  Safety Planning and Suicide Prevention discussed: No. Contact attempts made.  Have you used any form of tobacco in the last 30 days? (Cigarettes, Smokeless Tobacco, Cigars, and/or Pipes): No  Has patient been referred to the Quitline?: N/A patient is not a smoker  Patient has been referred for addiction treatment: Yes  Lorri FrederickWierda, Vivia [place] Jon, LCSW 07/15/2017, 11:45 AM

## 2017-07-15 NOTE — Progress Notes (Signed)
Patient remains sad but he is cooperative with treatment. He continues to ambulate with four wheeled walker. Cooperative, compliant with medications and meals. He denies SI/HI/AVH. Will continue to monitor. He appears to be in bed resting quietly at this time.

## 2017-07-15 NOTE — [facility] Suicide Risk Assessment (Signed)
[facility]   Principal Problem: Schizophrenia, undifferentiated (HCC) Discharge Diagnoses:  Patient Active Problem List   Diagnosis Date Noted  . 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 Schizophrenia, paranoid (HCC) [F20.0] 06/09/2017  . PTSD (post-traumatic stress disorder) [F43.10] 06/08/2017  . Inflamed external hemorrhoid [K64.4] 05/13/2017  . End stage liver disease (HCC) [K72.90] 04/24/2017  . Esophageal varices (HCC) [I85.00]   . Hematemesis [K92.0] 04/05/2017  . Upper GI bleed [K92.2]   . Iron deficiency anemia due to chronic blood loss [D50.0] 02/16/2017  . Nausea [R11.0] 01/18/2017  . Respiratory failure with hypoxia (HCC) [J96.91] 12/14/2016  . Seizures (HCC) [R56.9] 12/12/2016  . DNR (do not resuscitate) discussion [Z71.89] 08/11/2016  . Muscle weakness (generalized) [M62.81]   . GI bleed [K92.2] 08/07/2016  . OSA (obstructive sleep apnea) [G47.33] 04/29/2016  . Anemia [D64.9] 04/29/2016  . Thrombocytopenia (HCC) [D69.6] 04/29/2016  . Coagulopathy (HCC) [D68.9] 04/29/2016  . Obesity [E66.9] 04/29/2016  . Controlled type 2 diabetes mellitus without complication (HCC) [E11.9] 01/15/2016  . Essential (primary) hypertension [I10] 12/11/2015  . Pure hypercholesterolemia [E78.00] 12/11/2015  . Hepatic encephalopathy (HCC) [K72.90] 10/16/2015  . 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 Elevated transaminase level [R74.0] 07/04/2015  . Alcohol abuse [F10.10] 05/31/2015  . Paranoid schizophrenia (HCC) [F20.0] 03/20/2015    Total Time spent with patient: 20 minutes  Musculoskeletal: Strength & Muscle Tone: within normal limits Gait & Station:Walker Patient leans: N/A  Psychiatric Specialty Exam: ROS  Blood pressure 129/75, pulse 94, temperature 98.5 F (36.9 C), temperature source Oral, resp. rate 18, height 6\' 3"  (1.905 m), weight (!) 153.3 kg (338 lb).Body mass index is 42.25 kg/m.   General Appearance: Casual  Eye Contact::  Good  Speech:  Clear and Coherent409  Volume:  Normal  Mood:  Euthymic  Affect:  Congruent  Thought Process:  Goal Directed  Orientation:  Full (Time, Place, and Person)  Thought Content:  Negative  Suicidal Thoughts:  No  Homicidal Thoughts:  No  Memory:  Immediate;   Fair  Judgement:  Fair  Insight:  Fair  Psychomotor Activity:  Normal  Concentration:  Fair  Recall:  [employer]Fair  Fund of Knowledge:Fair  Language: Fair  Akathisia:  No    AIMS (if indicated):   0  Assets:  Communication Skills Leisure Time  Sleep:  Number of Hours: 7.5  Cognition: WNL  ADL's:  Intact   Mental Status Per Nursing Assessment::   On Admission:   (reports AH "telling me to kill myself)  Demographic Factors:  Male, Caucasian and Unemployed  Loss Factors: Decrease in vocational status and Decline in physical health  Historical Factors: Impulsivity  Risk Reduction Factors:   Sense of responsibility to family, Living with another person, especially a relative and Positive therapeutic relationship  Continued Clinical Symptoms:  none  Cognitive Features That Contribute To Risk:  None    Suicide Risk:  Minimal: No identifiable suicidal ideation.  Patients presenting with no risk factors but with morbid ruminations; may be classified as minimal risk based on the severity of the depressive symptoms  Follow-up Information    [facility]. Go on 07/21/2017.   Specialty:  Behavioral Health Why:  Please attend your follow up appointment with Dr. Toni Amendlapacs on Tuesday, 07/21/17, at 1:00pm.  Please bring a copy of your hospital discharge paperwork. Please attend your therapy appointment with Nolon RodNicole Peacock at 2:00pm on Tuesday, 08/04/17. Contact information: [address] [facility] [place] [phone]          Plan Of Care/Follow-up recommendations:  Follow up with Dr.  Renaldo Fiddler on 10/30  Haskell Riling, MD 07/15/2017, 10:23 AM

## 2017-07-15 NOTE — Discharge Summary (Signed)
Physician Discharge Summary Note  Patient:  Howard [name] is an 36 y.o., male MRN:  [id number] DOB:  1980/10/14 Patient phone:  [phone] (home)  Patient address:   [address] [place] [address],  Total Time spent with patient: 20 minutes  Date of Admission:  07/09/2017 Date of Discharge: 07/15/17  Reason for Admission:  Command AH  Principal Problem: Schizophrenia, undifferentiated [facility]) Discharge Diagnoses: Patient Active Problem List   Diagnosis Date Noted  . 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 Schizophrenia, paranoid (HCC) [F20.0] 06/09/2017  . PTSD (post-traumatic stress disorder) [F43.10] 06/08/2017  . Inflamed external hemorrhoid [K64.4] 05/13/2017  . End stage liver disease (HCC) [K72.90] 04/24/2017  . Esophageal varices (HCC) [I85.00]   . Hematemesis [K92.0] 04/05/2017  . Upper GI bleed [K92.2]   . Iron deficiency anemia due to chronic blood loss [D50.0] 02/16/2017  . Nausea [R11.0] 01/18/2017  . Respiratory failure with hypoxia (HCC) [J96.91] 12/14/2016  . Seizures (HCC) [R56.9] 12/12/2016  . DNR (do not resuscitate) discussion [Z71.89] 08/11/2016  . Muscle weakness (generalized) [M62.81]   . GI bleed [K92.2] 08/07/2016  . OSA (obstructive sleep apnea) [G47.33] 04/29/2016  . Anemia [D64.9] 04/29/2016  . Thrombocytopenia (HCC) [D69.6] 04/29/2016  . Coagulopathy (HCC) [D68.9] 04/29/2016  . Obesity [E66.9] 04/29/2016  . Controlled type 2 diabetes mellitus without complication (HCC) [E11.9] 01/15/2016  . Essential (primary) hypertension [I10] 12/11/2015  . Pure hypercholesterolemia [E78.00] 12/11/2015  . Hepatic encephalopathy (HCC) [K72.90] 10/16/2015  . 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 Elevated transaminase level [R74.0] 07/04/2015  . Alcohol abuse [F10.10] 05/31/2015  . Paranoid schizophrenia (HCC) [F20.0] 03/20/2015    Past Psychiatric History: See H&P  Past Medical History:   Past Medical History:  Diagnosis Date  . Alcoholic cirrhosis of liver with ascites (HCC)   . Alcoholism (HCC)   . Anemia   . Atrial fibrillation (HCC)   . COPD (chronic obstructive pulmonary disease) (HCC)   . Depression   . Diabetes mellitus, type II (HCC)   . Esophageal varices (HCC)   . Heart disease    irregular heart beat (palpitations) and heart murmur  . Hyperlipemia   . Hypertension   . Liver disease   . Multiple thyroid nodules   . Portal hypertensive gastropathy (HCC)   . Schizophrenia (HCC)   . Seizures (HCC)     Past Surgical History:  Procedure Laterality Date  . ESOPHAGOGASTRODUODENOSCOPY N/A 10/05/2015   Procedure: ESOPHAGOGASTRODUODENOSCOPY (EGD);  Surgeon: Elnita Maxwell, MD;  Location: [facility] ENDOSCOPY;  Service: Endoscopy;  Laterality: N/A;  . ESOPHAGOGASTRODUODENOSCOPY N/A 06/30/2017   Procedure: ESOPHAGOGASTRODUODENOSCOPY (EGD);  Surgeon: Toney Reil, MD;  Location: [facility] ENDOSCOPY;  Service: Gastroenterology;  Laterality: N/A;  . ESOPHAGOGASTRODUODENOSCOPY (EGD) WITH PROPOFOL N/A 08/03/2015   Procedure: ESOPHAGOGASTRODUODENOSCOPY (EGD) WITH PROPOFOL;  Surgeon: Elnita Maxwell, MD;  Location: [facility] ENDOSCOPY;  Service: Endoscopy;  Laterality: N/A;  . ESOPHAGOGASTRODUODENOSCOPY (EGD) WITH PROPOFOL N/A 08/31/2015   Procedure: ESOPHAGOGASTRODUODENOSCOPY (EGD) WITH PROPOFOL;  Surgeon: Elnita Maxwell, MD;  Location: [facility] ENDOSCOPY;  Service: Endoscopy;  Laterality: N/A;  . ESOPHAGOGASTRODUODENOSCOPY (EGD) WITH PROPOFOL N/A 04/04/2016   Procedure: ESOPHAGOGASTRODUODENOSCOPY (EGD) WITH PROPOFOL;  Surgeon: Scot Jun, MD;  Location: [facility] ENDOSCOPY;  Service: Endoscopy;  Laterality: N/A;  . ESOPHAGOGASTRODUODENOSCOPY (EGD) WITH PROPOFOL N/A 11/13/2016   Procedure: ESOPHAGOGASTRODUODENOSCOPY (EGD) WITH PROPOFOL;  Surgeon: Wyline Mood, MD;  Location: [facility] ENDOSCOPY;  Service: Endoscopy;  Laterality: N/A;  . ESOPHAGOGASTRODUODENOSCOPY (EGD) WITH PROPOFOL  N/A 04/07/2017  Procedure: ESOPHAGOGASTRODUODENOSCOPY (EGD) WITH PROPOFOL;  Surgeon: Midge MiniumWohl, Darren, MD;  [facility] END[facility]COPY;  Service: Endoscopy;  Laterality: N/A;  . NO PAST SURGERIES     Family History:  Family History  Problem Relation Age of Onset  . Heart disease Mother   . Hypertension Mother   . Hyperlipidemia Mother   . Stroke Father   . Heart attack Father   . Hypertension Father   . Heart disease Father   . Alcohol abuse Father   . Heart disease Brother    Family Psychiatric  History: See H&P Social History:  History  Alcohol Use  . 0.0 oz/week    Comment: last drink 1 days ago (noted: 05/30/2017)     History  Drug Use No    Social History   Social History  . Marital status: Single    Spouse name: N/A  . Number of children: N/A  . Years of education: N/A   Occupational History  . disabled    Social History Main Topics  . Smoking status: Never Smoker  . Smokeless tobacco: Never Used  . Alcohol use 0.0 oz/week     Comment: last drink 1 days ago (noted: 05/30/2017)  . Drug use: No  . Sexual activity: No   Other Topics Concern  . None   Social History Narrative   ** Merged History Encounter **        Hospital Course:  Pt was restarted on home medications including Clozapine with no changes. He was started on very low dose of Trilifan for continued auditory hallucinations. Clozapine was not increased due to excessive salivation. (Clozapine level was 435)Pt has history of very heavy alcohol use resulting in cirrhosis of the liver. He did not want to continue intensive outpatient because it would interfere with hospice coming to his home. Discussed with him about option of group home for better support however, he declined this stating that he wanted to stay at home with his mother and brother. Pt tolerated new medication well with no side effects. By day of discharge, he appeared much brighter, denied any AH or VH and states that they have completely  resolved. When asked, he denied SI or any thoughts of self harm. HE denies HI.  He plans to go to AA meetings 3 times a week. He will follow up with Dr. Renaldo Fiddlerlapecs.   The patient is at low risk of imminent suicide. Patient denied thoughts, intent, or plan for harm to self or others, expressed significant future orientation, and expressed an ability to mobilize assistance for his needs. He is presently void of any contributing psychiatric symptoms, cognitive difficulties, or substance use which would elevate his risk for lethality. Chronic risk for lethality is elevated in light of ongoing alcohol use The chronic risk is presently mitigated by his ongoing desire and [facility] and mobilization of support from family and friends. Chronic risk may elevate if he experiences any significant loss or worsening of symptoms, which can be managed and monitored through outpatient providers. At this time,a cute risk for lethality is low and he is stable for ongoing outpatient management.   Modifiable risk factors were addressed during this hospitalization through appropriate pharmacotherapy and establishment of outpatient follow-up treatment. Some risk factors for suicide are  related personality pathology (i.e. Poor coping mechanisms) and thus cannot be further mitigated by continued hospitalization in this setting.    Physical Findings: AIMS: Facial and Oral Movements Muscles of Facial Expression: None, normal Lips and Perioral  Area: None, normal Jaw: None, normal Tongue: None, normal,Extremity Movements Upper (arms, wrists, hands, fingers): None, normal Lower (legs, knees, ankles, toes): None, normal, Trunk Movements Neck, shoulders, hips: None, normal, Overall Severity Severity of abnormal movements (highest score from questions above): None, normal Incapacitation due to abnormal movements: None, normal Patient's awareness of abnormal movements (rate only patient's report): No Awareness,  Dental Status Current problems with teeth and/or dentures?: No Does patient usually wear dentures?: No  CIWA:  CIWA-Ar Total: 1 COWS:     Musculoskeletal: Strength & Muscle Tone: within normal limits Gait & Station: Uses walker Patient leans: N/A  Psychiatric Specialty Exam: Physical Exam  ROS  Blood pressure 129/75, pulse 94, temperature 98.5 F (36.9 C), temperature source Oral, resp. rate 18, height 6\' 3"  (1.905 m), weight (!) 153.3 kg (338 lb).Body mass index is 42.25 kg/m.   General Appearance: Casual  Eye Contact::  Good  Speech:  Clear and Coherent  Volume:  Normal  Mood:  Euthymic  Affect:  Congruent  Thought Process:  Goal Directed  Orientation:  Full (Time, Place, and Person)  Thought Content:  Negative  Suicidal Thoughts:  No  Homicidal Thoughts:  No  Memory:  Immediate;   Fair  Judgement:  Fair  Insight:  Fair  Psychomotor Activity:  Normal  Concentration:  Fair  Recall:  Fair  Fund of Knowledge:Fair  Language: Fair  Akathisia:  No    AIMS (if indicated):   0  Assets:  Communication Skills Leisure Time  Sleep:  Number of Hours: 7.5  Cognition: WNL  ADL's:  Intact     Have you used any form of tobacco in the last 30 days? (Cigarettes, Smokeless Tobacco, Cigars, and/or Pipes): No  Has this patient used any form of tobacco in the last 30 days? (Cigarettes, Smokeless Tobacco, Cigars, and/or Pipes) Yes, No  Blood Alcohol level:  Lab Results  Component Value Date   ETH <10 07/08/2017   ETH <5 06/08/2017    Metabolic Disorder Labs:  Lab Results  Component Value Date   HGBA1C 5.7 (H) 06/10/2017   MPG 116.89 06/10/2017   MPG 151 12/12/2016   No results found for: PROLACTIN Lab Results  Component Value Date   CHOL 199 06/10/2017   TRIG 97 06/10/2017   HDL 37 (L) 06/10/2017   CHOLHDL 5.4 06/10/2017   VLDL 19 06/10/2017   LDLCALC 143 (H) 06/10/2017   LDLCALC 121 (H) 01/19/2017    See Psychiatric Specialty Exam and Suicide Risk Assessment  completed by Attending Physician prior to discharge.  Discharge destination:  Home  Is patient on multiple antipsychotic therapies at discharge:  Yes,   Do you recommend tapering to monotherapy for antipsychotics?  No   Has Patient had three or more failed trials of antipsychotic monotherapy by history:  Yes, Zyprexa, Geodon, Latuda  Recommended Plan for Multiple Antipsychotic Therapies: Additional reason(s) for multiple antispychotic treatment:  pt experiecning AH on Clozapine. He is having excessive salivation on Clozapine so unable to increase dose at this time  Discharge Instructions    Increase activity slowly    Complete by:  As directed      Allergies as of 07/15/2017      Reactions   Tramadol Itching      Medication List    STOP taking these medications   morphine CONCENTRATE 10 mg / 0.5 ml concentrated solution   prochlorperazine 10 MG tablet Commonly known as:  COMPAZINE     TAKE these medications  Indication  albuterol 108 (90 Base) MCG/ACT inhaler Commonly known as:  PROVENTIL HFA;VENTOLIN HFA Inhale 2 puffs into the lungs every 4 (four) hours as needed for wheezing or shortness of breath.  Indication:  Asthma   clozapine 50 MG tablet Commonly known as:  CLOZARIL Take 3 tablets (150 mg total) by mouth at bedtime.  Indication:  Schizophrenia that does Not Respond to Usual Drug Therapy   ferrous sulfate 325 (65 FE) MG tablet Take 1 tablet (325 mg total) by mouth daily with breakfast.  Indication:  Anemia From Inadequate Iron in the Body   folic acid 1 MG tablet Commonly known as:  FOLVITE Take 1 tablet (1 mg total) by mouth daily.  Indication:  Anemia From Inadequate Folic Acid   hydrOXYzine 25 MG tablet Commonly known as:  ATARAX/VISTARIL Take 1 tablet (25 mg total) by mouth 3 (three) times daily as needed for anxiety.  Indication:  Feeling Anxious   ipratropium-albuterol 0.5-2.5 (3) MG/3ML Soln Commonly known as:  DUONEB Take 3 mLs by  nebulization every 6 (six) hours.  Indication:  Spasm of Lung Air Passages   lactulose 10 GM/15ML solution Commonly known as:  CHRONULAC Take 67.5 mLs (45 g total) by mouth 3 (three) times daily.  Indication:  Impaired Brain Function due to Liver Disease   levETIRAcetam 750 MG tablet Commonly known as:  KEPPRA Take 2 tablets (1,500 mg total) by mouth 2 (two) times daily.  Indication:  Muscular Spasm or Twitch occurring with Seizures   LORazepam 0.5 MG tablet Commonly known as:  ATIVAN Take 0.5 mg by mouth every 4 (four) hours as needed for anxiety.  Indication:  Feeling Anxious   losartan 25 MG tablet Commonly known as:  COZAAR Take 1 tablet (25 mg total) by mouth daily.  Indication:  High Blood Pressure Disorder   metFORMIN 500 MG tablet Commonly known as:  GLUCOPHAGE Take 1 tablet (500 mg total) by mouth 2 (two) times daily with a meal.  Indication:  Antipsychotic Therapy-Induced Weight Gain   nadolol 20 MG tablet Commonly known as:  CORGARD Take 1 tablet (20 mg total) by mouth daily.  Indication:  High Blood Pressure Disorder   pantoprazole 40 MG tablet Commonly known as:  PROTONIX Take 1 tablet (40 mg total) by mouth daily.  Indication:  Esophagus Inflammation with Erosion, Gastroesophageal Reflux Disease   perphenazine 2 MG tablet Commonly known as:  TRILAFON Take 1 tablet (2 mg total) by mouth 2 (two) times daily.  Indication:  Psychosis   rifaximin 550 MG Tabs tablet Commonly known as:  XIFAXAN Take 1 tablet (550 mg total) by mouth 2 (two) times daily.  Indication:  Impaired Brain Function due to Liver Disease   sucralfate 1 g tablet Commonly known as:  CARAFATE Take 1 tablet (1 g total) by mouth 4 (four) times daily -  with meals and at bedtime.  Indication:  Ulcer of the Duodenum   thiamine 100 MG tablet Take 1 tablet (100 mg total) by mouth daily.  Indication:  Chronic Diarrhea, Deficiency in Thiamine or Vitamin B1   traZODone 100 MG tablet Commonly  known as:  DESYREL Take 100 mg by mouth at bedtime as needed for sleep.  Indication:  Trouble Sleeping      Follow-up Information    [facility]. Go on 07/21/2017.   Specialty:  Behavioral Health Why:  Please attend your follow up appointment with [name] on Tuesday, 07/21/17, at 1:00pm.  Please bring a copy of  your hospital discharge paperwork. Please attend your therapy appointment with Nolon Rod at 2:00pm on Tuesday, 08/04/17. Contact information: [address] [facility] [place] [address] [phone]          Follow-up recommendations: Follow up with Dr. Renaldo Fiddler on 07/21/17  Signed: Haskell Riling, MD 07/15/2017, 10:25 AM

## 2017-07-17 ENCOUNTER — Telehealth: Payer: Self-pay

## 2017-07-17 NOTE — Telephone Encounter (Signed)
I have made the 1st attempt to contact the patient or family member in charge, in order to follow up from recently being discharged from the hospital. I left a message on voicemail but I will make another attempt at a different time.   Direct call back - [phone] 

## 2017-07-17 NOTE — Telephone Encounter (Signed)
I have made the 2nd attempt to contact the patient or family member in charge, in order to follow up from recently being discharged from the hospital. I left a message on voicemail but I will make another attempt at a different time.  

## 2017-07-21 ENCOUNTER — Encounter: Payer: Self-pay | Admitting: Psychiatry

## 2017-07-21 ENCOUNTER — Ambulatory Visit (INDEPENDENT_AMBULATORY_CARE_PROVIDER_SITE_OTHER): Payer: Medicare Other | Admitting: Psychiatry

## 2017-07-21 VITALS — BP 148/90 | HR 92 | Temp 98.6°F | Wt 349.0 lb

## 2017-07-21 DIAGNOSIS — F200 Paranoid schizophrenia: Secondary | ICD-10-CM

## 2017-07-21 DIAGNOSIS — F1010 Alcohol abuse, uncomplicated: Secondary | ICD-10-CM

## 2017-07-21 NOTE — Progress Notes (Signed)
Patient seen for follow-up.  36 year old man who has both schizophrenia and alcohol abuse.  Since going home from the hospital he has been medication compliant.  He admits however that he has continued to drink alcohol regularly probably a couple of 40 ounce beers every other day.  He has had a return of his hallucinations and depression.  Patient is casually but neatly groomed.  Affect flat and dysphoric.  Reports hallucinations but his thoughts are lucid enough.  Suicidal thoughts without any acute intent or plan.  I talked with him for a while about the reasons why I had suggested he go to a group home previously.  I am not surprised that he has had a return of symptoms despite taking his medicine.  His home life is very stressful to him and his alcohol use clearly drives his symptoms.  I recommended that he give some more thought to moving to a group home and I will talk with the social workers about it.  I do not see any reason to change his medicine given that it is quite effective when he is in the hospital.  I reminded him of the importance of getting his blood test drawn if he wants to continue the clozapine.  He is to go to [facility] and get that done.  I will see him in 2 weeks although he knows he can come back sooner if needed.

## 2017-07-27 ENCOUNTER — Other Ambulatory Visit: Payer: Self-pay | Admitting: Psychiatry

## 2017-07-27 ENCOUNTER — Ambulatory Visit: Payer: Self-pay | Admitting: Nurse Practitioner

## 2017-07-27 DIAGNOSIS — Z79899 Other long term (current) drug therapy: Secondary | ICD-10-CM

## 2017-08-04 ENCOUNTER — Ambulatory Visit (INDEPENDENT_AMBULATORY_CARE_PROVIDER_SITE_OTHER): Payer: Medicare Other | Admitting: Psychiatry

## 2017-08-04 ENCOUNTER — Ambulatory Visit (INDEPENDENT_AMBULATORY_CARE_PROVIDER_SITE_OTHER): Payer: Medicare Other | Admitting: Licensed Clinical Social Worker

## 2017-08-04 ENCOUNTER — Encounter: Payer: Self-pay | Admitting: Psychiatry

## 2017-08-04 DIAGNOSIS — F1010 Alcohol abuse, uncomplicated: Secondary | ICD-10-CM | POA: Diagnosis not present

## 2017-08-04 DIAGNOSIS — F200 Paranoid schizophrenia: Secondary | ICD-10-CM

## 2017-08-04 MED ORDER — LITHIUM CARBONATE ER 300 MG PO TBCR: 300.0000 mg | EXTENDED_RELEASE_TABLET | Freq: Every day | ORAL | 1 refills | Status: AC

## 2017-08-04 MED ORDER — CLOZAPINE 100 MG PO TABS: 200.0000 mg | ORAL_TABLET | Freq: Every day | ORAL | 2 refills | Status: AC

## 2017-08-04 NOTE — Progress Notes (Signed)
Follow-up 36 year old man with schizophrenia and alcohol abuse.  He comes in today looking pretty rough.  Very worn down.  Depressed.  Hallucinations are still pretty bad.  Sometimes command with suicidal content but he has no intention of trying to kill himself.  Thoughts are lucid he is appropriately interactive.  Casually dressed.  Blunted sad affect.  Lucid appropriate interaction.  Denies suicidal intent.  Does feel somewhat hopeless.  He admits that he is continuing to drink without any real let up.  Expressed sympathy and compassion.  We have tried what we could to get him to stop drinking.  Patient continues to consume alcohol at a rate that is worsening his cirrhosis and worsening his mood.  Additionally his home life is very stressful and surely contributes to his decompensation.  I did advise that we increase his clozapine to 200 mg given his continued hallucinations and also add lithium 300 mg at night.  This may help with his mood and suicidality.  His recent creatinines have been fine.  He agrees to this and I will see him back in 2 weeks.

## 2017-08-05 NOTE — Progress Notes (Signed)
   THERAPIST PROGRESS NOTE  Session Time: 45min  Participation Level: Active  Behavioral Response: Fairly GroomedAlertEuphoric  Type of Therapy: Individual Therapy  Treatment Goals addressed: Coping  Interventions: CBT, Motivational Interviewing and Supportive  Summary: Howard [name] is a 36 y.o. male who presents with a depressed mood.  Patient reports that he has not had an favorable mood.  He describes his mood as "ugh".  He reports that he feels sick and has been sick for several day.  He reports that he is unable to eat.  Discussion of going to his PCP to determine cause of sickness.  Patient continues to have hospice and reports that he is not ready to die.  Discussion of how he wants to spend the rest of his life either sober or using alcohol.  Patient reports that he has been hearing voices and wants his Psychiatrist to assist with making them go away.   Suicidal/Homicidal: No  Therapist Response: Therapist actively listened as Patient reported current mood and discussed stressors. Processed with patient about his symptoms and behaviors. Discussed progress and regression while at home.  Discussed self awareness and the importance of understanding how his body is reacting to his medication. Explored the need of going to visit his PCP to discover medical concerns.   Plan: Return again in 2 weeks.  Diagnosis: Axis I: Paranoid Schizophrenia    Axis II: No diagnosis    Marinda Elkicole M Srinivas Lippman, LCSW 08/05/2017

## 2017-08-18 ENCOUNTER — Emergency Department
Admission: EM | Admit: 2017-08-18 | Discharge: 2017-08-19 | Disposition: A | Discharge status: Other Acute Inpatient Hospital | Attending: Emergency Medicine | Admitting: Emergency Medicine

## 2017-08-18 ENCOUNTER — Ambulatory Visit (INDEPENDENT_AMBULATORY_CARE_PROVIDER_SITE_OTHER): Payer: Medicare Other | Admitting: Psychiatry

## 2017-08-18 ENCOUNTER — Encounter: Payer: Self-pay | Admitting: Psychiatry

## 2017-08-18 ENCOUNTER — Other Ambulatory Visit: Payer: Self-pay

## 2017-08-18 DIAGNOSIS — I10 Essential (primary) hypertension: Secondary | ICD-10-CM | POA: Diagnosis not present

## 2017-08-18 DIAGNOSIS — I4891 Unspecified atrial fibrillation: Secondary | ICD-10-CM | POA: Diagnosis not present

## 2017-08-18 DIAGNOSIS — E7800 Pure hypercholesterolemia, unspecified: Secondary | ICD-10-CM | POA: Diagnosis present

## 2017-08-18 DIAGNOSIS — R440 Auditory hallucinations: Secondary | ICD-10-CM

## 2017-08-18 DIAGNOSIS — F1010 Alcohol abuse, uncomplicated: Secondary | ICD-10-CM | POA: Insufficient documentation

## 2017-08-18 DIAGNOSIS — K7290 Hepatic failure, unspecified without coma: Secondary | ICD-10-CM | POA: Diagnosis not present

## 2017-08-18 DIAGNOSIS — R441 Visual hallucinations: Secondary | ICD-10-CM

## 2017-08-18 DIAGNOSIS — F200 Paranoid schizophrenia: Secondary | ICD-10-CM | POA: Insufficient documentation

## 2017-08-18 DIAGNOSIS — Z7984 Long term (current) use of oral hypoglycemic drugs: Secondary | ICD-10-CM | POA: Insufficient documentation

## 2017-08-18 DIAGNOSIS — D500 Iron deficiency anemia secondary to blood loss (chronic): Secondary | ICD-10-CM | POA: Diagnosis present

## 2017-08-18 DIAGNOSIS — R945 Abnormal results of liver function studies: Secondary | ICD-10-CM

## 2017-08-18 DIAGNOSIS — J449 Chronic obstructive pulmonary disease, unspecified: Secondary | ICD-10-CM | POA: Diagnosis not present

## 2017-08-18 DIAGNOSIS — R000 Tachycardia, unspecified: Secondary | ICD-10-CM

## 2017-08-18 DIAGNOSIS — K7031 Alcoholic cirrhosis of liver with ascites: Secondary | ICD-10-CM | POA: Diagnosis present

## 2017-08-18 DIAGNOSIS — E119 Type 2 diabetes mellitus without complications: Secondary | ICD-10-CM | POA: Diagnosis not present

## 2017-08-18 DIAGNOSIS — F1020 Alcohol dependence, uncomplicated: Secondary | ICD-10-CM | POA: Diagnosis present

## 2017-08-18 DIAGNOSIS — D689 Coagulation defect, unspecified: Secondary | ICD-10-CM | POA: Diagnosis present

## 2017-08-18 DIAGNOSIS — F4310 Post-traumatic stress disorder, unspecified: Secondary | ICD-10-CM | POA: Diagnosis not present

## 2017-08-18 DIAGNOSIS — R7989 Other specified abnormal findings of blood chemistry: Secondary | ICD-10-CM

## 2017-08-18 DIAGNOSIS — R569 Unspecified convulsions: Secondary | ICD-10-CM

## 2017-08-18 LAB — DIFFERENTIAL
BAND NEUTROPHILS: 0 %
Basophils Absolute: 0.1 10*3/uL (ref 0–0.1)
Basophils Relative: 1 %
Blasts: 0 %
EOS ABS: 0.1 10*3/uL (ref 0–0.7)
Eosinophils Relative: 2 %
LYMPHS ABS: 1 10*3/uL (ref 1.0–3.6)
Lymphocytes Relative: 16 %
METAMYELOCYTES PCT: 0 %
MONO ABS: 0.5 10*3/uL (ref 0.2–1.0)
MYELOCYTES: 0 %
Monocytes Relative: 9 %
NRBC: 0 /100{WBCs}
Neutro Abs: 4.3 10*3/uL (ref 1.4–6.5)
Neutrophils Relative %: 72 %
OTHER: 0 %
PROMYELOCYTES ABS: 0 %

## 2017-08-18 LAB — COMPREHENSIVE METABOLIC PANEL
ALBUMIN: 3.5 g/dL (ref 3.5–5.0)
ALT: 28 U/L (ref 17–63)
AST: 68 U/L — AB (ref 15–41)
Alkaline Phosphatase: 132 U/L — ABNORMAL HIGH (ref 38–126)
Anion gap: 8 (ref 5–15)
BUN: 6 mg/dL (ref 6–20)
CHLORIDE: 104 mmol/L (ref 101–111)
CO2: 27 mmol/L (ref 22–32)
Calcium: 8.5 mg/dL — ABNORMAL LOW (ref 8.9–10.3)
Creatinine, Ser: 0.7 mg/dL (ref 0.61–1.24)
GFR calc Af Amer: >60 mL/min (ref >60)
GFR calc non Af Amer: >60 mL/min (ref >60)
GLUCOSE: 101 mg/dL — AB (ref 65–99)
POTASSIUM: 3.9 mmol/L (ref 3.5–5.1)
SODIUM: 139 mmol/L (ref 135–145)
TOTAL PROTEIN: 7.6 g/dL (ref 6.5–8.1)
Total Bilirubin: 1.4 mg/dL — ABNORMAL HIGH (ref 0.3–1.2)

## 2017-08-18 LAB — URINE DRUG SCREEN, QUALITATIVE (ARMC ONLY)
AMPHETAMINES, UR SCREEN: NOT DETECTED
BENZODIAZEPINE, UR SCRN: POSITIVE — AB
Barbiturates, Ur Screen: NOT DETECTED
Cannabinoid 50 Ng, Ur ~~LOC~~: NOT DETECTED
Cocaine Metabolite,Ur ~~LOC~~: NOT DETECTED
MDMA (ECSTASY) UR SCREEN: NOT DETECTED
METHADONE SCREEN, URINE: NOT DETECTED
Opiate, Ur Screen: NOT DETECTED
Phencyclidine (PCP) Ur S: NOT DETECTED
Tricyclic, Ur Screen: NOT DETECTED

## 2017-08-18 LAB — CBC
HEMATOCRIT: 36.2 % — AB (ref 40.0–52.0)
Hemoglobin: 11.5 g/dL — ABNORMAL LOW (ref 13.0–18.0)
MCH: 27.8 pg (ref 26.0–34.0)
MCHC: 31.9 g/dL — ABNORMAL LOW (ref 32.0–36.0)
MCV: 87.1 fL (ref 80.0–100.0)
PLATELETS: 100 10*3/uL — AB (ref 150–440)
RBC: 4.16 MIL/uL — AB (ref 4.40–5.90)
RDW: 16 % — AB (ref 11.5–14.5)
WBC: 6 10*3/uL (ref 3.8–10.6)

## 2017-08-18 LAB — GLUCOSE, CAPILLARY: GLUCOSE-CAPILLARY: 79 mg/dL (ref 65–99)

## 2017-08-18 LAB — ACETAMINOPHEN LEVEL

## 2017-08-18 LAB — SALICYLATE LEVEL: Salicylate Lvl: <7 mg/dL (ref 2.8–30.0)

## 2017-08-18 LAB — ETHANOL

## 2017-08-18 MED ORDER — LOSARTAN POTASSIUM 50 MG PO TABS
ORAL_TABLET | ORAL | Status: AC
  Administered 2017-08-18: 25 mg via ORAL
  Filled 2017-08-18: qty 1

## 2017-08-18 MED ORDER — INSULIN ASPART 100 UNIT/ML ~~LOC~~ SOLN: 0.0000 [IU] | Freq: Three times a day (TID) | SUBCUTANEOUS | Status: DC

## 2017-08-18 MED ORDER — LACTULOSE 10 GM/15ML PO SOLN
ORAL | Status: AC
  Administered 2017-08-18: 30 g via ORAL
  Filled 2017-08-18: qty 60

## 2017-08-18 MED ORDER — METFORMIN HCL 500 MG PO TABS
500.0000 mg | ORAL_TABLET | Freq: Two times a day (BID) | ORAL | Status: DC
  Administered 2017-08-18: 500 mg via ORAL

## 2017-08-18 MED ORDER — FERROUS SULFATE 325 (65 FE) MG PO TABS: 325.0000 mg | ORAL_TABLET | Freq: Every day | ORAL | Status: DC

## 2017-08-18 MED ORDER — NADOLOL 20 MG PO TABS
20.0000 mg | ORAL_TABLET | Freq: Every day | ORAL | Status: DC
  Administered 2017-08-18: 20 mg via ORAL
  Filled 2017-08-18: qty 1

## 2017-08-18 MED ORDER — CLOZAPINE 25 MG PO TABS
200.0000 mg | ORAL_TABLET | Freq: Every day | ORAL | Status: DC
  Administered 2017-08-18: 200 mg via ORAL
  Filled 2017-08-18: qty 8

## 2017-08-18 MED ORDER — SUCRALFATE 1 G PO TABS
1.0000 g | ORAL_TABLET | Freq: Three times a day (TID) | ORAL | Status: DC
  Administered 2017-08-18: 1 g via ORAL

## 2017-08-18 MED ORDER — LACTULOSE 10 GM/15ML PO SOLN
30.0000 g | Freq: Three times a day (TID) | ORAL | Status: DC
  Administered 2017-08-18 (×2): 30 g via ORAL
  Filled 2017-08-18: qty 60

## 2017-08-18 MED ORDER — VITAMIN B-1 100 MG PO TABS
100.0000 mg | ORAL_TABLET | Freq: Every day | ORAL | Status: DC
  Administered 2017-08-18: 100 mg via ORAL

## 2017-08-18 MED ORDER — LOSARTAN POTASSIUM 50 MG PO TABS
25.0000 mg | ORAL_TABLET | Freq: Every day | ORAL | Status: DC
  Administered 2017-08-18: 25 mg via ORAL

## 2017-08-18 MED ORDER — PANTOPRAZOLE SODIUM 40 MG PO TBEC
40.0000 mg | DELAYED_RELEASE_TABLET | Freq: Every day | ORAL | Status: DC
  Administered 2017-08-18: 40 mg via ORAL

## 2017-08-18 MED ORDER — VITAMIN B-1 100 MG PO TABS
ORAL_TABLET | ORAL | Status: AC
  Administered 2017-08-18: 100 mg via ORAL
  Filled 2017-08-18: qty 1

## 2017-08-18 MED ORDER — PANTOPRAZOLE SODIUM 40 MG PO TBEC
DELAYED_RELEASE_TABLET | ORAL | Status: AC
  Administered 2017-08-18: 40 mg via ORAL
  Filled 2017-08-18: qty 1

## 2017-08-18 MED ORDER — TRAZODONE HCL 100 MG PO TABS
100.0000 mg | ORAL_TABLET | Freq: Every day | ORAL | Status: DC
  Administered 2017-08-18: 100 mg via ORAL
  Filled 2017-08-18: qty 1

## 2017-08-18 MED ORDER — LEVETIRACETAM 500 MG PO TABS
1500.0000 mg | ORAL_TABLET | Freq: Two times a day (BID) | ORAL | Status: DC
  Administered 2017-08-18: 1500 mg via ORAL
  Filled 2017-08-18: qty 3

## 2017-08-18 MED ORDER — SUCRALFATE 1 G PO TABS
ORAL_TABLET | ORAL | Status: AC
  Administered 2017-08-18: 1 g via ORAL
  Filled 2017-08-18: qty 1

## 2017-08-18 MED ORDER — METFORMIN HCL 500 MG PO TABS
ORAL_TABLET | ORAL | Status: AC
  Administered 2017-08-18: 500 mg via ORAL
  Filled 2017-08-18: qty 1

## 2017-08-18 MED ORDER — LITHIUM CARBONATE 300 MG PO CAPS
300.0000 mg | ORAL_CAPSULE | Freq: Every day | ORAL | Status: DC
  Administered 2017-08-18: 300 mg via ORAL
  Filled 2017-08-18: qty 1

## 2017-08-18 MED ORDER — RIFAXIMIN 550 MG PO TABS
550.0000 mg | ORAL_TABLET | Freq: Two times a day (BID) | ORAL | Status: DC
  Administered 2017-08-18: 550 mg via ORAL
  Filled 2017-08-18: qty 1

## 2017-08-18 MED ORDER — ALBUTEROL SULFATE HFA 108 (90 BASE) MCG/ACT IN AERS
2.0000 | INHALATION_SPRAY | Freq: Four times a day (QID) | RESPIRATORY_TRACT | Status: DC | PRN
  Filled 2017-08-18: qty 6.7

## 2017-08-18 NOTE — ED Notes (Signed)
[facility]  Patient assigned to room 304 Accepting physician is Dr. Jennet MaduroPucilowska.  Call report to [phone].  Representative was [employer].  ER Staff is aware of it Olegario Messier([facility].; Dr. Manson PasseyBrown, ER MD & Sherri Patient's Nurse)

## 2017-08-18 NOTE — ED Notes (Signed)
Patient given cup of water.

## 2017-08-18 NOTE — ED Notes (Signed)
Patient is resting comfortably. 

## 2017-08-18 NOTE — Consult Note (Signed)
[facility] Consult   Reason for Consult: Consult for 36 year old man with a history of schizophrenia and alcohol abuse who comes in with recurrent suicidal ideation and worsening Referring Physician: Mariea Clonts Patient Identification: Howard [name] MRN:  [id number] Principal Diagnosis: Paranoid schizophrenia [facility]) Diagnosis:   Patient Active Problem List   Diagnosis Date Noted  . 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 Schizophrenia, paranoid ([place]) [F20.0] 06/09/2017  . PTSD (post-traumatic stress disorder) [F43.10] 06/08/2017  . Inflamed external hemorrhoid [K64.4] 05/13/2017  . End stage liver disease ([place]) [K72.90] 04/24/2017  . Esophageal varices (HCC) [I85.00]   . Hematemesis [K92.0] 04/05/2017  . Upper GI bleed [K92.2]   . Iron deficiency anemia due to chronic blood loss [D50.0] 02/16/2017  . Nausea [R11.0] 01/18/2017  . Respiratory failure with hypoxia ([place]) [J96.91] 12/14/2016  . Seizures ([place]) [R56.9] 12/12/2016  . DNR (do not resuscitate) discussion [Z71.89] 08/11/2016  . Muscle weakness (generalized) [M62.81]   . GI bleed [K92.2] 08/07/2016  . OSA (obstructive sleep apnea) [G47.33] 04/29/2016  . Anemia [D64.9] 04/29/2016  . Thrombocytopenia ([place]) [D69.6] 04/29/2016  . Coagulopathy ([place]) [D68.9] 04/29/2016  . Obesity [E66.9] 04/29/2016  . Controlled type 2 diabetes mellitus without complication ([place]) [S06.3] 01/15/2016  . Essential (primary) hypertension [I10] 12/11/2015  . Pure hypercholesterolemia [E78.00] 12/11/2015  . Hepatic encephalopathy ([place]) [K72.90] 10/16/2015  . Alcoholic cirrhosis of liver with ascites ([place]) [K70.31] 07/19/2015  . Elevated transaminase level [R74.0] 07/04/2015  . Alcohol abuse [F10.10] 05/31/2015  . Paranoid schizophrenia ([place]) [F20.0] 03/20/2015    Total Time spent with patient: 1 hour  Subjective:   Howard [name] is a 36 y.o. male patient admitted with "it has been bad".  HPI: Patient seen  and interviewed chart reviewed.  Patient well known to me as an outpatient.  He reports that over the last several weeks despite medication compliance he has been having worsening symptoms.  Hallucinations are occurring on a daily basis and are intrusive and often contained suicidal content and insulting content.  He continues to drink alcohol approximately every other day.  When he does drink he drinks to excess and only stays off of it for a day because he feels so sick in between.  Patient has been coming to outpatient medication management but has minimal support in transport and has not been going to therapy regularly.  He has suicidal thoughts with thoughts about overdosing on his medicine.  No violence.  Social history: Patient lives with his mother and brother.  Not able to work.  Minimal social support.  Very conflicted relationship most of the time with his family who are not really very supportive and often enable his drinking.  Substance abuse history: Took up drinking relatively late but has developed a heavy alcohol abuse problem with resultant liver damage and cirrhosis.  Has also had a history of seizures coming off alcohol.  Medical history: Obese, cirrhosis, history of esophageal varices and bleeding, COPD, sleep apnea, diabetes, hypertension  Past Psychiatric History: Long history of schizophrenia has been on multiple medicines.  Responded well to clozapine and tolerates it reasonably well.  Psychosis gets worse when he goes home.  Does have a history of suicidal behavior no history of violence  Risk to Self:   Risk to Others:   Prior Inpatient Therapy:   Prior Outpatient Therapy:    Past Medical History:  Past Medical History:  Diagnosis Date  . Alcoholic cirrhosis of liver with ascites ([place])   . Alcoholism ([place])   .  Anemia   . Atrial fibrillation ([place])   . COPD (chronic obstructive pulmonary disease) ([place])   . Depression   . Diabetes mellitus, type II ([place])   .  Esophageal varices ([place])   . Heart disease    irregular heart beat (palpitations) and heart murmur  . Hyperlipemia   . Hypertension   . Liver disease   . Multiple thyroid nodules   . Portal hypertensive gastropathy ([place])   . Schizophrenia ([place])   . Seizures ([place])     Past Surgical History:  Procedure Laterality Date  . ESOPHAGOGASTRODUODENOSCOPY N/A 10/05/2015   Procedure: ESOPHAGOGASTRODUODENOSCOPY (EGD);  Surgeon: Josefine Class, MD;  Location: [facility] ENDOSCOPY;  Service: Endoscopy;  Laterality: N/A;  . ESOPHAGOGASTRODUODENOSCOPY N/A 06/30/2017   Procedure: ESOPHAGOGASTRODUODENOSCOPY (EGD);  Surgeon: Lin Landsman, MD;  Location: [facility] ENDOSCOPY;  Service: Gastroenterology;  Laterality: N/A;  . ESOPHAGOGASTRODUODENOSCOPY (EGD) WITH PROPOFOL N/A 08/03/2015   Procedure: ESOPHAGOGASTRODUODENOSCOPY (EGD) WITH PROPOFOL;  Surgeon: Josefine Class, MD;  Location: [facility] ENDOSCOPY;  Service: Endoscopy;  Laterality: N/A;  . ESOPHAGOGASTRODUODENOSCOPY (EGD) WITH PROPOFOL N/A 08/31/2015   Procedure: ESOPHAGOGASTRODUODENOSCOPY (EGD) WITH PROPOFOL;  Surgeon: Josefine Class, MD;  Location: [facility] ENDOSCOPY;  Service: Endoscopy;  Laterality: N/A;  . ESOPHAGOGASTRODUODENOSCOPY (EGD) WITH PROPOFOL N/A 04/04/2016   Procedure: ESOPHAGOGASTRODUODENOSCOPY (EGD) WITH PROPOFOL;  Surgeon: Manya Silvas, MD;  Location: [facility] ENDOSCOPY;  Service: Endoscopy;  Laterality: N/A;  . ESOPHAGOGASTRODUODENOSCOPY (EGD) WITH PROPOFOL N/A 11/13/2016   Procedure: ESOPHAGOGASTRODUODENOSCOPY (EGD) WITH PROPOFOL;  Surgeon: Jonathon Bellows, MD;  Location: [facility] ENDOSCOPY;  Service: Endoscopy;  Laterality: N/A;  . ESOPHAGOGASTRODUODENOSCOPY (EGD) WITH PROPOFOL N/A 04/07/2017   Procedure: ESOPHAGOGASTRODUODENOSCOPY (EGD) WITH PROPOFOL;  Surgeon: Lucilla Lame, MD;  Location: [facility] ENDOSCOPY;  Service: Endoscopy;  Laterality: N/A;  . NO PAST SURGERIES     Family History:  Family History  Problem Relation Age of Onset  . Heart  disease Mother   . Hypertension Mother   . Hyperlipidemia Mother   . Stroke Father   . Heart attack Father   . Hypertension Father   . Heart disease Father   . Alcohol abuse Father   . Heart disease Brother    Family Psychiatric  History: Family history positive for alcohol abuse and anxiety and mood disorders Social History:  Social History   Substance and Sexual Activity  Alcohol Use Yes  . Alcohol/week: 0.0 oz   Comment: last drink 1 days ago (noted: 05/30/2017)     Social History   Substance and Sexual Activity  Drug Use No    Social History   Socioeconomic History  . Marital status: Single    Spouse name: None  . Number of children: None  . Years of education: None  . Highest education level: None  Social Needs  . Financial resource strain: None  . Food insecurity - worry: None  . Food insecurity - inability: None  . Transportation needs - medical: None  . Transportation needs - non-medical: None  Occupational History  . Occupation: disabled  Tobacco Use  . Smoking status: Never Smoker  . Smokeless tobacco: Never Used  Substance and Sexual Activity  . Alcohol use: Yes    Alcohol/week: 0.0 oz    Comment: last drink 1 days ago (noted: 05/30/2017)  . Drug use: No  . Sexual activity: No    Birth control/protection: None  Other Topics Concern  . None  Social History Narrative   ** Merged History Encounter **       Additional  Social History:    Allergies:   Allergies  Allergen Reactions  . Tramadol Itching    Labs:  Results for orders placed or performed during the hospital encounter of 08/18/17 (from the past 48 hour(s))  Comprehensive metabolic panel     Status: Abnormal   Collection Time: 08/18/17  3:36 PM  Result Value Ref Range   Sodium 139 135 - 145 mmol/L   Potassium 3.9 3.5 - 5.1 mmol/L   Chloride 104 101 - 111 mmol/L   CO2 27 22 - 32 mmol/L   Glucose, Bld 101 (H) 65 - 99 mg/dL   BUN 6 6 - 20 mg/dL   Creatinine, Ser 0.70 0.61 - 1.24  mg/dL   Calcium 8.5 (L) 8.9 - 10.3 mg/dL   Total Protein 7.6 6.5 - 8.1 g/dL   Albumin 3.5 3.5 - 5.0 g/dL   AST 68 (H) 15 - 41 U/L   ALT 28 17 - 63 U/L   Alkaline Phosphatase 132 (H) 38 - 126 U/L   Total Bilirubin 1.4 (H) 0.3 - 1.2 mg/dL   GFR calc non Af Amer >60 >60 mL/min   GFR calc Af Amer >60 >60 mL/min    Comment: (NOTE) The eGFR has been calculated using the CKD EPI equation. This calculation has not been validated in all clinical situations. eGFR's persistently <60 mL/min signify possible Chronic Kidney Disease.    Anion gap 8 5 - 15  Ethanol     Status: None   Collection Time: 08/18/17  3:36 PM  Result Value Ref Range   Alcohol, Ethyl (B) <10 <10 mg/dL    Comment:        LOWEST DETECTABLE LIMIT FOR SERUM ALCOHOL IS 10 mg/dL FOR MEDICAL PURPOSES ONLY   Salicylate level     Status: None   Collection Time: 08/18/17  3:36 PM  Result Value Ref Range   Salicylate Lvl <6.5 2.8 - 30.0 mg/dL  Acetaminophen level     Status: Abnormal   Collection Time: 08/18/17  3:36 PM  Result Value Ref Range   Acetaminophen (Tylenol), Serum <10 (L) 10 - 30 ug/mL    Comment:        THERAPEUTIC CONCENTRATIONS VARY SIGNIFICANTLY. A RANGE OF 10-30 ug/mL MAY BE AN EFFECTIVE CONCENTRATION FOR MANY PATIENTS. HOWEVER, SOME ARE BEST TREATED AT CONCENTRATIONS OUTSIDE THIS RANGE. ACETAMINOPHEN CONCENTRATIONS >150 ug/mL AT 4 HOURS AFTER INGESTION AND >50 ug/mL AT 12 HOURS AFTER INGESTION ARE OFTEN ASSOCIATED WITH TOXIC REACTIONS.   cbc     Status: Abnormal   Collection Time: 08/18/17  3:36 PM  Result Value Ref Range   WBC 6.0 3.8 - 10.6 K/uL   RBC 4.16 (L) 4.40 - 5.90 MIL/uL   Hemoglobin 11.5 (L) 13.0 - 18.0 g/dL   HCT 36.2 (L) 40.0 - 52.0 %   MCV 87.1 80.0 - 100.0 fL   MCH 27.8 26.0 - 34.0 pg   MCHC 31.9 (L) 32.0 - 36.0 g/dL   RDW 16.0 (H) 11.5 - 14.5 %   Platelets 100 (L) 150 - 440 K/uL    Current Facility-Administered Medications  Medication Dose Route Frequency Provider Last  Rate Last Dose  . albuterol (PROVENTIL HFA;VENTOLIN HFA) 108 (90 Base) MCG/ACT inhaler 2 puff  2 puff Inhalation Q6H PRN Robby Bulkley T, MD      . cloZAPine (CLOZARIL) tablet 200 mg  200 mg Oral QHS Holy Battenfield T, MD      . Derrill Memo ON 08/19/2017] ferrous sulfate tablet 325 mg  325 mg Oral Q breakfast Elita Dame T, MD      . lactulose (CHRONULAC) 10 GM/15ML solution 30 g  30 g Oral TID Rayquan Amrhein T, MD      . levETIRAcetam (KEPPRA) tablet 1,500 mg  1,500 mg Oral BID Lliam Hoh T, MD      . lithium carbonate capsule 300 mg  300 mg Oral QHS Kasch Borquez T, MD      . losartan (COZAAR) tablet 25 mg  25 mg Oral Daily Tovah Slavick T, MD      . metFORMIN (GLUCOPHAGE) tablet 500 mg  500 mg Oral BID WC Simar Pothier T, MD      . nadolol (CORGARD) tablet 20 mg  20 mg Oral Daily Christorpher Hisaw T, MD      . pantoprazole (PROTONIX) EC tablet 40 mg  40 mg Oral Daily Jaquelin Meaney T, MD      . rifaximin (XIFAXAN) tablet 550 mg  550 mg Oral BID Lexander Tremblay T, MD      . sucralfate (CARAFATE) tablet 1 g  1 g Oral TID WC & HS Angelino Rumery T, MD      . thiamine (VITAMIN B-1) tablet 100 mg  100 mg Oral Daily Bailei Buist T, MD      . traZODone (DESYREL) tablet 100 mg  100 mg Oral QHS Cain Fitzhenry, Madie Reno, MD       Current Outpatient Medications  Medication Sig Dispense Refill  . albuterol (PROVENTIL HFA;VENTOLIN HFA) 108 (90 Base) MCG/ACT inhaler Inhale 2 puffs into the lungs every 4 (four) hours as needed for wheezing or shortness of breath. 1 Inhaler 3  . clozapine (CLOZARIL) 100 MG tablet Take 2 tablets (200 mg total) at bedtime by mouth. 60 tablet 2  . ferrous sulfate 325 (65 FE) MG tablet Take 1 tablet (325 mg total) by mouth daily with breakfast. 30 tablet 3  . folic acid (FOLVITE) 1 MG tablet Take 1 tablet (1 mg total) by mouth daily. 30 tablet 3  . hydrOXYzine (ATARAX/VISTARIL) 25 MG tablet Take 1 tablet (25 mg total) by mouth 3 (three) times daily as needed for anxiety. 30 tablet 3  .  ipratropium-albuterol (DUONEB) 0.5-2.5 (3) MG/3ML SOLN Take 3 mLs by nebulization every 6 (six) hours. 360 mL 1  . lactulose (CHRONULAC) 10 GM/15ML solution Take 67.5 mLs (45 g total) by mouth 3 (three) times daily. 240 mL 3  . levETIRAcetam (KEPPRA) 750 MG tablet Take 2 tablets (1,500 mg total) by mouth 2 (two) times daily. 120 tablet 3  . lithium carbonate (LITHOBID) 300 MG CR tablet Take 1 tablet (300 mg total) at bedtime by mouth. 30 tablet 1  . LORazepam (ATIVAN) 0.5 MG tablet Take 0.5 mg by mouth every 4 (four) hours as needed for anxiety.    Marland Kitchen losartan (COZAAR) 25 MG tablet Take 1 tablet (25 mg total) by mouth daily. 30 tablet 3  . metFORMIN (GLUCOPHAGE) 500 MG tablet Take 1 tablet (500 mg total) by mouth 2 (two) times daily with a meal. 60 tablet 3  . nadolol (CORGARD) 20 MG tablet Take 1 tablet (20 mg total) by mouth daily. 30 tablet 3  . pantoprazole (PROTONIX) 40 MG tablet Take 1 tablet (40 mg total) by mouth daily. 30 tablet 3  . perphenazine (TRILAFON) 2 MG tablet Take 1 tablet (2 mg total) by mouth 2 (two) times daily. 60 tablet 0  . rifaximin (XIFAXAN) 550 MG TABS tablet Take 1 tablet (550 mg total) by  mouth 2 (two) times daily. 60 tablet 3  . sucralfate (CARAFATE) 1 g tablet Take 1 tablet (1 g total) by mouth 4 (four) times daily -  with meals and at bedtime. 120 tablet 3  . thiamine 100 MG tablet Take 1 tablet (100 mg total) by mouth daily. 30 tablet 3  . traZODone (DESYREL) 100 MG tablet Take 100 mg by mouth at bedtime as needed for sleep.      Musculoskeletal: Strength & Muscle Tone: within normal limits Gait & Station: normal Patient leans: N/A  Psychiatric Specialty Exam: Physical Exam  Nursing note and vitals reviewed. Constitutional: He appears well-developed and well-nourished.  HENT:  Head: Normocephalic and atraumatic.  Eyes: Conjunctivae are normal. Pupils are equal, round, and reactive to light.  Neck: Normal range of motion.  Cardiovascular: Regular rhythm  and normal heart sounds.  Respiratory: Effort normal. No respiratory distress.  GI: Soft. He exhibits distension.  Musculoskeletal: Normal range of motion.  Neurological: He is alert.  Skin: Skin is warm and dry.  Psychiatric: Judgment normal. His affect is blunt. His speech is delayed. He is slowed and withdrawn. Thought content is paranoid. He exhibits a depressed mood. He expresses suicidal ideation. He expresses suicidal plans. He exhibits abnormal recent memory.    Review of Systems  Constitutional: Negative.   HENT: Negative.   Eyes: Negative.   Respiratory: Negative.   Cardiovascular: Negative.   Gastrointestinal: Negative.   Musculoskeletal: Negative.   Skin: Negative.   Neurological: Negative.   Psychiatric/Behavioral: Positive for depression, hallucinations, substance abuse and suicidal ideas. Negative for memory loss. The patient is nervous/anxious and has insomnia.     Blood pressure (!) 146/91, pulse (!) 106, temperature 98.1 F (36.7 C), temperature source Oral, resp. rate (!) 22, height 6' 3"  (1.905 m), weight (!) 154.2 kg (340 lb), SpO2 91 %.Body mass index is 42.5 kg/m.  General Appearance: Casual  Eye Contact:  Good  Speech:  Slow  Volume:  Decreased  Mood:  Depressed and Dysphoric  Affect:  Constricted and Depressed  Thought Process:  Goal Directed  Orientation:  Full (Time, Place, and Person)  Thought Content:  Paranoid Ideation, Rumination and Tangential  Suicidal Thoughts:  Yes.  with intent/plan  Homicidal Thoughts:  No  Memory:  Immediate;   Fair Recent;   Fair Remote;   Fair  Judgement:  Fair  Insight:  Fair  Psychomotor Activity:  Decreased  Concentration:  Concentration: Fair  Recall:  [employer] of Knowledge:  Fair  Language:  Fair  Akathisia:  No  Handed:  Right  AIMS (if indicated):     Assets:  Desire for Improvement Housing Resilience  ADL's:  Intact  Cognition:  Impaired,  Mild  Sleep:        Treatment Plan Summary: Daily  contact with patient to assess and evaluate symptoms and progress in treatment, Medication management and Plan Patient with psychosis and alcohol abuse and suicidality who meets criteria for admission to the hospital for stabilization.  He and I have spoken about my recommendation that he was consented to moving into a group home which I think would be a big advance for him.  Orders completed continue current medicine.  Labs all will be checked including checking for blood sugars checking EKG getting a lithium level.  Case reviewed with emergency room physician and TTS.  Disposition: Recommend psychiatric Inpatient admission when medically cleared. Supportive therapy provided about ongoing stressors.  Alethia Berthold, MD 08/18/2017 4:53 PM
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
## 2017-08-18 NOTE — ED Triage Notes (Signed)
Pt reports that he was sent to ER by Dr. Toni Amendlapacs for increasing visual and auditory hallucinations. Pt has not been complaints with his medications for schizophrenia. Denies HI and SI.

## 2017-08-18 NOTE — ED Notes (Signed)
[id number] password given to mother with patient consent

## 2017-08-18 NOTE — ED Notes (Signed)
Wallet sent home with mother per patient request.

## 2017-08-18 NOTE — Progress Notes (Signed)
Follow-up 36 year old man history of schizophrenia and alcohol abuse.  Continues to report mood is very down.  Feels sad most of the time.  Drinking every other day.  Active suicidal thoughts with wish to die.  Active hallucinations despite medication compliance.  Casually dressed gentleman very sluggish and slow.  Alert and oriented.  Having hallucinations.  Having suicidal thoughts.  Sad depressed affect.  Reviewed with patient his drinking patterns.  Tried to make some progress with motivational interviewing and cognitive exploration and how he could improve his drinking.  Patient is very hopeless and helpless around this.  Given the active suicidal thoughts I recommend he come to the emergency room and that we try readmitting him to the hospital.  I made it clear to him that I strongly encouraged him to go to a group home after this hospitalization.  Patient is agreeable to the plan for now.  Refer to the emergency room expect to see him later this afternoon.

## 2017-08-18 NOTE — ED Provider Notes (Signed)
[facility] Emergency Department Provider Note  ____________________________________________  Time seen: Approximately 6:21 PM  I have reviewed the triage vital signs and the nursing notes.   HISTORY  Chief Complaint Hallucinations    HPI Howard [name] is a 36 y.o. male 3 of schizophrenia, as well as A. fib, COPD, HTN and HL, liver disease, presenting for increasing SI and HI.  The patient was sent here by Dr. Toni Amendlapacs, his psychiatrist, for several weeks of progressively worsening SI and HI.  He states that his auditory hallucinations tell him to kill himself "but I would never do that."  He has no homicidal ideations.  He has had no recent changes in his medications.  He denies any medical complaints at this time.  Past Medical History:  Diagnosis Date  . Alcoholic cirrhosis of liver with ascites (HCC)   . Alcoholism (HCC)   . Anemia   . Atrial fibrillation (HCC)   . COPD (chronic obstructive pulmonary disease) (HCC)   . Depression   . Diabetes mellitus, type II (HCC)   . Esophageal varices (HCC)   . Heart disease    irregular heart beat (palpitations) and heart murmur  . Hyperlipemia   . Hypertension   . Liver disease   . Multiple thyroid nodules   . Portal hypertensive gastropathy (HCC)   . Schizophrenia (HCC)   . Seizures [facility](HCC)     Patient Active Problem List   Diagnosis Date Noted  . Schizophrenia, undifferentiated (HCC) 07/09/2017  . Schizophrenia, paranoid (HCC) 06/09/2017  . PTSD (post-traumatic stress disorder) 06/08/2017  . Inflamed external hemorrhoid 05/13/2017  . End stage liver disease (HCC) 04/24/2017  . Esophageal varices (HCC)   . Hematemesis 04/05/2017  . Upper GI bleed   . Iron deficiency anemia due to chronic blood loss 02/16/2017  . Nausea 01/18/2017  . Respiratory failure with hypoxia (HCC) 12/14/2016  . Seizures (HCC) 12/12/2016  . DNR (do not resuscitate) discussion 08/11/2016  . Muscle weakness (generalized)    . GI bleed 08/07/2016  . OSA (obstructive sleep apnea) 04/29/2016  . Anemia 04/29/2016  . Thrombocytopenia (HCC) 04/29/2016  . Coagulopathy (HCC) 04/29/2016  . Obesity 04/29/2016  . Controlled type 2 diabetes mellitus without complication (HCC) 01/15/2016  . Essential (primary) hypertension 12/11/2015  . Pure hypercholesterolemia 12/11/2015  . Hepatic encephalopathy (HCC) 10/16/2015  . 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 Elevated transaminase level 07/04/2015  . Alcohol abuse 05/31/2015  . Paranoid schizophrenia (HCC) 03/20/2015    Past Surgical History:  Procedure Laterality Date  . ESOPHAGOGASTRODUODENOSCOPY N/A 10/05/2015   Procedure: ESOPHAGOGASTRODUODENOSCOPY (EGD);  Surgeon: Elnita MaxwellMatthew Gordon Rein, MD;  [facility];  Service: Endoscopy;  Laterality: N/A;  . ESOPHAGOGASTRODUODENOSCOPY N/A 06/30/2017   Procedure: ESOPHAGOGASTRODUODENOSCOPY (EGD);  Surgeon: Toney ReilVanga, Rohini Reddy, MD;  [facility];  Service: Gastroenterology;  Laterality: N/A;  . ESOPHAGOGASTRODUODENOSCOPY (EGD) WITH PROPOFOL N/A 08/03/2015   Procedure: ESOPHAGOGASTRODUODENOSCOPY (EGD) WITH PROPOFOL;  Surgeon: Elnita MaxwellMatthew Gordon Rein, MD;  [facility];  Service: Endoscopy;  Laterality: N/A;  . ESOPHAGOGASTRODUODENOSCOPY (EGD) WITH PROPOFOL N/A 08/31/2015   Procedure: ESOPHAGOGASTRODUODENOSCOPY (EGD) WITH PROPOFOL;  Surgeon: Elnita MaxwellMatthew Gordon Rein, MD;  [facility];  Service: Endoscopy;  Laterality: N/A;  . ESOPHAGOGASTRODUODENOSCOPY (EGD) WITH PROPOFOL N/A 04/04/2016   Procedure: ESOPHAGOGASTRODUODENOSCOPY (EGD) WITH PROPOFOL;  Surgeon: Scot Junobert T Elliott, MD;  [facility];  Service: Endoscopy;  Laterality: N/A;  . ESOPHAGOGASTRODUODENOSCOPY (EGD) WITH PROPOFOL N/A 11/13/2016   Procedure: ESOPHAGOGASTRODUODENOSCOPY (EGD) WITH PROPOFOL;  Surgeon:  Wyline Mood, MD;  Location: [facility] ENDOSCOPY;  Service: Endoscopy;  Laterality: N/A;  .  ESOPHAGOGASTRODUODENOSCOPY (EGD) WITH PROPOFOL N/A 04/07/2017   Procedure: ESOPHAGOGASTRODUODENOSCOPY (EGD) WITH PROPOFOL;  Surgeon: Midge Minium, MD;  Location: [facility] ENDOSCOPY;  Service: Endoscopy;  Laterality: N/A;  . NO PAST SURGERIES      Current Outpatient Rx  . Order #: [id number] Class: Normal  . Order #: [id number] Class: Normal  . Order #: [id number] Class: Normal  . Order #: [id number] Class: Normal  . Order #: [id number] Class: Normal  . Order #: [id number] Class: Normal  . Order #: [id number] Class: Normal  . Order #: [id number] Class: Normal  . Order #: [id number] Class: Normal  . Order #: [id number] Class: Historical Med  . Order #: [id number] Class: Normal  . Order #: [id number] Class: Normal  . Order #: [id number] Class: Normal  . Order #: [id number] Class: Normal  . Order #: [id number] Class: Normal  . Order #: [id number] Class: Normal  . Order #: [id number] Class: Normal  . Order #: [id number] Class: Normal  . Order #: [id number] Class: Historical Med    Allergies Tramadol  Family History  Problem Relation Age of Onset  . Heart disease Mother   . Hypertension Mother   . Hyperlipidemia Mother   . Stroke Father   . Heart attack Father   . Hypertension Father   . Heart disease Father   . Alcohol abuse Father   . Heart disease Brother     Social History Social History   Tobacco Use  . Smoking status: Never Smoker  . Smokeless tobacco: Never Used  Substance Use Topics  . Alcohol use: Yes    Alcohol/week: 0.0 oz    Comment: last drink 1 days ago (noted: 05/30/2017)  . Drug use: No    Review of Systems Constitutional: No fever/chills. Eyes: No visual changes. ENT: No sore throat. No congestion or rhinorrhea. Cardiovascular: Denies chest pain. Denies palpitations. Respiratory: Denies shortness of breath.  No cough. Gastrointestinal: No abdominal pain.  No nausea, no vomiting.  No diarrhea.  No constipation. Genitourinary: Negative for dysuria. Musculoskeletal: Negative for back  pain. Skin: Negative for rash. Neurological: Negative for headaches. No focal numbness, tingling or weakness.  Psychiatric:+ worsening auditory and visual hallucinations but denies active suicidality or homicidality.     ____________________________________________   PHYSICAL EXAM:  VITAL SIGNS: ED Triage Vitals [08/18/17 1537]  Enc Vitals Group     BP (!) 146/91     Pulse Rate (!) 106     Resp (!) 22     Temp 98.1 F (36.7 C)     Temp Source Oral     SpO2 91 %     Weight (!) 340 lb (154.2 kg)     Height 6\' 3"  (1.905 m)     Head Circumference      Peak Flow      Pain Score      Pain Loc      Pain Edu?      Excl. in GC?     Constitutional: Alert and oriented.  Chronically ill appearing but lying comfortably in the stretcher and in no acute distress. Answers questions appropriately. Eyes: Conjunctivae are normal.  EOMI. No scleral icterus. Head: Atraumatic. Nose: No congestion/rhinnorhea. Mouth/Throat: Mucous membranes are mildly dry.  Neck: No stridor.  Supple.  No JVD.  No meningismus. Cardiovascular: Fast rate, regular rhythm. No murmurs, rubs or gallops.  Respiratory: Normal respiratory effort.  No accessory muscle use or retractions. Lungs CTAB.  No wheezes, rales or ronchi. Gastrointestinal: Obese soft, nontender and nondistended.  No guarding or rebound.  No peritoneal signs. Musculoskeletal: moves all extremities well Neurologic:  A&Ox3.  Speech is clear.  Face and smile are symmetric.  EOMI.  Moves all extremities well. Skin:  Skin is warm, dry and intact. No rash noted. Psychiatric: Mood and affect are normal. Speech and behavior are normal.  Normal judgement.  ____________________________________________   LABS (all labs ordered are listed, but only abnormal results are displayed)  Labs Reviewed  COMPREHENSIVE METABOLIC PANEL - Abnormal; Notable for the following components:      Result Value   Glucose, Bld 101 (*)    Calcium 8.5 (*)    AST 68 (*)     Alkaline Phosphatase 132 (*)    Total Bilirubin 1.4 (*)    All other components within normal limits  ACETAMINOPHEN LEVEL - Abnormal; Notable for the following components:   Acetaminophen (Tylenol), Serum <10 (*)    All other components within normal limits  CBC - Abnormal; Notable for the following components:   RBC 4.16 (*)    Hemoglobin 11.5 (*)    HCT 36.2 (*)    MCHC 31.9 (*)    RDW 16.0 (*)    Platelets 100 (*)    All other components within normal limits  URINE DRUG SCREEN, QUALITATIVE ([facility] ONLY) - Abnormal; Notable for the following components:   Benzodiazepine, Ur Scrn POSITIVE (*)    All other components within normal limits  ETHANOL  SALICYLATE LEVEL  CLOZAPINE (CLOZARIL)  DIFFERENTIAL   ____________________________________________  EKG  ED ECG REPORT I, Rockne Menghini, the attending physician, personally viewed and interpreted this ECG.   Date: 08/18/2017  EKG Time: 1711  Rate: 109  Rhythm: sinus tachycardia  Axis: leftward  Intervals:none  ST&T Change: No STEMI  ____________________________________________  RADIOLOGY  No results found.  ____________________________________________   PROCEDURES  Procedure(s) performed: None  Procedures  Critical Care performed: No ____________________________________________   INITIAL IMPRESSION / ASSESSMENT AND PLAN / ED COURSE  Pertinent labs & imaging results that were available during my care of the patient were reviewed by me and considered in my medical decision making (see chart for details).  36 y.o. male with multiple cardiac chronic comorbidities, schizophrenia, presenting out for increasing auditory and visual hallucinations but no active suicidality or homicidality.  Overall, the patient is mildly hypertensive and tachycardic, and has not taken any of his cardiac medications.  We have ordered all these and are waiting for some of them to come from the pharmacy.  In addition, he does have  mildly dry mucous membranes will give him some fluids to see if we can reverse his tachycardia.  Plan to recheck his vital signs but I do not think there is an acute Cardiologic condition today.  Once the patient's heart rate normalizes, he will be medically cleared for psychiatric disposition.  ----------------------------------------- 6:26 PM on 08/18/2017 -----------------------------------------  The patient's liver function tests are mildly elevated, which they have been in the past and he has known liver disease.  He will need to follow this up as an outpatient.  ----------------------------------------- 10:16 PM on 08/18/2017 -----------------------------------------  The patient's tachycardia resolved when he took his home medications and he has continued to remain hemodynamically stable.  At this time, he is medically cleared for psychiatric disposition.  ____________________________________________  FINAL CLINICAL IMPRESSION(S) / ED DIAGNOSES  Final diagnoses:  Abnormal LFTs  Sinus tachycardia  Auditory hallucinations  Visual hallucinations         NEW MEDICATIONS STARTED DURING THIS  VISIT:  This SmartLink is deprecated. Use AVSMEDLIST instead to display the medication list for a patient.    Rockne MenghiniNorman, Anne-Caroline, MD 08/18/17 2216

## 2017-08-18 NOTE — ED Notes (Signed)
Called TTS about when pt can go downstairs.

## 2017-08-19 ENCOUNTER — Other Ambulatory Visit: Payer: Self-pay

## 2017-08-19 ENCOUNTER — Inpatient Hospital Stay
Admission: AD | Admit: 2017-08-19 | Discharge: 2017-08-24 | DRG: 885 | Disposition: A | Discharge status: Home / Self Care | Payer: Medicare Other | Source: Intra-hospital | Attending: Psychiatry | Admitting: Psychiatry

## 2017-08-19 DIAGNOSIS — K766 Portal hypertension: Secondary | ICD-10-CM | POA: Diagnosis present

## 2017-08-19 DIAGNOSIS — J449 Chronic obstructive pulmonary disease, unspecified: Secondary | ICD-10-CM | POA: Diagnosis present

## 2017-08-19 DIAGNOSIS — E119 Type 2 diabetes mellitus without complications: Secondary | ICD-10-CM

## 2017-08-19 DIAGNOSIS — Z7984 Long term (current) use of oral hypoglycemic drugs: Secondary | ICD-10-CM | POA: Diagnosis not present

## 2017-08-19 DIAGNOSIS — R42 Dizziness and giddiness: Secondary | ICD-10-CM | POA: Diagnosis not present

## 2017-08-19 DIAGNOSIS — Z811 Family history of alcohol abuse and dependence: Secondary | ICD-10-CM

## 2017-08-19 DIAGNOSIS — Z62810 Personal history of physical and sexual abuse in childhood: Secondary | ICD-10-CM | POA: Diagnosis present

## 2017-08-19 DIAGNOSIS — K7030 Alcoholic cirrhosis of liver without ascites: Secondary | ICD-10-CM | POA: Diagnosis present

## 2017-08-19 DIAGNOSIS — I10 Essential (primary) hypertension: Secondary | ICD-10-CM | POA: Diagnosis present

## 2017-08-19 DIAGNOSIS — Z915 Personal history of self-harm: Secondary | ICD-10-CM | POA: Diagnosis not present

## 2017-08-19 DIAGNOSIS — G40909 Epilepsy, unspecified, not intractable, without status epilepticus: Secondary | ICD-10-CM | POA: Diagnosis present

## 2017-08-19 DIAGNOSIS — I8510 Secondary esophageal varices without bleeding: Secondary | ICD-10-CM | POA: Diagnosis present

## 2017-08-19 DIAGNOSIS — E669 Obesity, unspecified: Secondary | ICD-10-CM | POA: Diagnosis present

## 2017-08-19 DIAGNOSIS — Z5181 Encounter for therapeutic drug level monitoring: Secondary | ICD-10-CM | POA: Diagnosis not present

## 2017-08-19 DIAGNOSIS — R569 Unspecified convulsions: Secondary | ICD-10-CM

## 2017-08-19 DIAGNOSIS — I4891 Unspecified atrial fibrillation: Secondary | ICD-10-CM | POA: Diagnosis present

## 2017-08-19 DIAGNOSIS — F4310 Post-traumatic stress disorder, unspecified: Secondary | ICD-10-CM | POA: Diagnosis present

## 2017-08-19 DIAGNOSIS — E785 Hyperlipidemia, unspecified: Secondary | ICD-10-CM | POA: Diagnosis present

## 2017-08-19 DIAGNOSIS — F1020 Alcohol dependence, uncomplicated: Secondary | ICD-10-CM | POA: Diagnosis present

## 2017-08-19 DIAGNOSIS — F203 Undifferentiated schizophrenia: Principal | ICD-10-CM | POA: Diagnosis present

## 2017-08-19 DIAGNOSIS — F329 Major depressive disorder, single episode, unspecified: Secondary | ICD-10-CM | POA: Diagnosis present

## 2017-08-19 DIAGNOSIS — F1010 Alcohol abuse, uncomplicated: Secondary | ICD-10-CM | POA: Diagnosis present

## 2017-08-19 DIAGNOSIS — Z79899 Other long term (current) drug therapy: Secondary | ICD-10-CM | POA: Diagnosis not present

## 2017-08-19 DIAGNOSIS — D649 Anemia, unspecified: Secondary | ICD-10-CM | POA: Diagnosis present

## 2017-08-19 DIAGNOSIS — Z885 Allergy status to narcotic agent status: Secondary | ICD-10-CM

## 2017-08-19 LAB — GLUCOSE, CAPILLARY
GLUCOSE-CAPILLARY: 92 mg/dL (ref 65–99)
GLUCOSE-CAPILLARY: 118 mg/dL — AB (ref 65–99)
GLUCOSE-CAPILLARY: 107 mg/dL — AB (ref 65–99)
GLUCOSE-CAPILLARY: 85 mg/dL (ref 65–99)
Glucose-Capillary: 110 mg/dL — ABNORMAL HIGH (ref 65–99)

## 2017-08-19 LAB — LIPID PANEL
CHOL/HDL RATIO: 5.5 ratio
CHOLESTEROL: 154 mg/dL (ref 0–200)
HDL: 28 mg/dL — ABNORMAL LOW (ref >40)
LDL Cholesterol: 112 mg/dL — ABNORMAL HIGH (ref 0–99)
TRIGLYCERIDES: 71 mg/dL (ref <150)
VLDL: 14 mg/dL (ref 0–40)

## 2017-08-19 LAB — TSH: TSH: 3.303 u[IU]/mL (ref 0.350–4.500)

## 2017-08-19 MED ORDER — NADOLOL 20 MG PO TABS
20.0000 mg | ORAL_TABLET | Freq: Every day | ORAL | Status: DC
  Administered 2017-08-19 – 2017-08-24 (×6): 20 mg via ORAL
  Filled 2017-08-19 (×6): qty 1

## 2017-08-19 MED ORDER — SUCRALFATE 1 G PO TABS
1.0000 g | ORAL_TABLET | Freq: Three times a day (TID) | ORAL | Status: DC
  Administered 2017-08-19 – 2017-08-24 (×21): 1 g via ORAL
  Filled 2017-08-19 (×22): qty 1

## 2017-08-19 MED ORDER — LEVETIRACETAM 500 MG PO TABS
1500.0000 mg | ORAL_TABLET | Freq: Two times a day (BID) | ORAL | Status: DC
Start: 2017-08-19 — End: 2017-08-24
  Administered 2017-08-19 – 2017-08-24 (×11): 1500 mg via ORAL
  Filled 2017-08-19 (×11): qty 3

## 2017-08-19 MED ORDER — TRAZODONE HCL 100 MG PO TABS
100.0000 mg | ORAL_TABLET | Freq: Every day | ORAL | Status: DC
Start: 2017-08-19 — End: 2017-08-24
  Administered 2017-08-19 – 2017-08-23 (×5): 100 mg via ORAL
  Filled 2017-08-19 (×5): qty 1

## 2017-08-19 MED ORDER — FERROUS SULFATE 325 (65 FE) MG PO TABS
325.0000 mg | ORAL_TABLET | Freq: Every day | ORAL | Status: DC
  Administered 2017-08-19 – 2017-08-24 (×6): 325 mg via ORAL
  Filled 2017-08-19 (×6): qty 1

## 2017-08-19 MED ORDER — LITHIUM CARBONATE 300 MG PO CAPS
300.0000 mg | ORAL_CAPSULE | Freq: Every day | ORAL | Status: DC
  Administered 2017-08-19 – 2017-08-23 (×5): 300 mg via ORAL
  Filled 2017-08-19 (×6): qty 1

## 2017-08-19 MED ORDER — ONDANSETRON 4 MG PO TBDP: 4.0000 mg | ORAL_TABLET | Freq: Four times a day (QID) | ORAL | Status: AC | PRN

## 2017-08-19 MED ORDER — CLOZAPINE 100 MG PO TABS
200.0000 mg | ORAL_TABLET | Freq: Every day | ORAL | Status: DC
  Administered 2017-08-19 – 2017-08-23 (×5): 200 mg via ORAL
  Filled 2017-08-19 (×5): qty 2

## 2017-08-19 MED ORDER — ADULT MULTIVITAMIN W/MINERALS CH
1.0000 | ORAL_TABLET | Freq: Every day | ORAL | Status: DC
  Administered 2017-08-19 – 2017-08-24 (×6): 1 via ORAL
  Filled 2017-08-19 (×5): qty 1

## 2017-08-19 MED ORDER — VITAMIN B-1 100 MG PO TABS
100.0000 mg | ORAL_TABLET | Freq: Every day | ORAL | Status: DC
  Administered 2017-08-19: 100 mg via ORAL
  Filled 2017-08-19 (×6): qty 1

## 2017-08-19 MED ORDER — METFORMIN HCL 500 MG PO TABS
500.0000 mg | ORAL_TABLET | Freq: Two times a day (BID) | ORAL | Status: DC
  Administered 2017-08-19 – 2017-08-24 (×9): 500 mg via ORAL
  Filled 2017-08-19 (×11): qty 1

## 2017-08-19 MED ORDER — PANTOPRAZOLE SODIUM 40 MG PO TBEC
40.0000 mg | DELAYED_RELEASE_TABLET | Freq: Every day | ORAL | Status: DC
  Administered 2017-08-19 – 2017-08-24 (×6): 40 mg via ORAL
  Filled 2017-08-19 (×6): qty 1

## 2017-08-19 MED ORDER — ALBUTEROL SULFATE HFA 108 (90 BASE) MCG/ACT IN AERS
2.0000 | INHALATION_SPRAY | Freq: Four times a day (QID) | RESPIRATORY_TRACT | Status: DC | PRN
  Filled 2017-08-19: qty 6.7

## 2017-08-19 MED ORDER — LOSARTAN POTASSIUM 25 MG PO TABS
25.0000 mg | ORAL_TABLET | Freq: Every day | ORAL | Status: DC
  Administered 2017-08-19 – 2017-08-24 (×6): 25 mg via ORAL
  Filled 2017-08-19 (×6): qty 1

## 2017-08-19 MED ORDER — RIFAXIMIN 550 MG PO TABS
550.0000 mg | ORAL_TABLET | Freq: Two times a day (BID) | ORAL | Status: DC
  Administered 2017-08-19 – 2017-08-24 (×11): 550 mg via ORAL
  Filled 2017-08-19 (×11): qty 1

## 2017-08-19 MED ORDER — MAGNESIUM HYDROXIDE 400 MG/5ML PO SUSP: 30.0000 mL | Freq: Every day | ORAL | Status: DC | PRN

## 2017-08-19 MED ORDER — INSULIN ASPART 100 UNIT/ML ~~LOC~~ SOLN
0.0000 [IU] | Freq: Three times a day (TID) | SUBCUTANEOUS | Status: DC
  Administered 2017-08-23: 2 [IU] via SUBCUTANEOUS
  Filled 2017-08-19: qty 1

## 2017-08-19 MED ORDER — ACETAMINOPHEN 325 MG PO TABS: 650.0000 mg | ORAL_TABLET | Freq: Four times a day (QID) | ORAL | Status: DC | PRN

## 2017-08-19 MED ORDER — GABAPENTIN 300 MG PO CAPS
900.0000 mg | ORAL_CAPSULE | Freq: Three times a day (TID) | ORAL | Status: DC
  Administered 2017-08-19 – 2017-08-24 (×15): 900 mg via ORAL
  Filled 2017-08-19 (×15): qty 3

## 2017-08-19 MED ORDER — VITAMIN B-1 100 MG PO TABS
100.0000 mg | ORAL_TABLET | Freq: Every day | ORAL | Status: DC
  Administered 2017-08-20 – 2017-08-24 (×5): 100 mg via ORAL

## 2017-08-19 MED ORDER — LOPERAMIDE HCL 2 MG PO CAPS: 2.0000 mg | ORAL_CAPSULE | ORAL | Status: AC | PRN

## 2017-08-19 MED ORDER — ALUM & MAG HYDROXIDE-SIMETH 200-200-20 MG/5ML PO SUSP: 30.0000 mL | ORAL | Status: DC | PRN

## 2017-08-19 MED ORDER — LACTULOSE 10 GM/15ML PO SOLN
30.0000 g | Freq: Three times a day (TID) | ORAL | Status: DC
  Administered 2017-08-19 – 2017-08-24 (×12): 30 g via ORAL
  Filled 2017-08-19 (×14): qty 60

## 2017-08-19 MED ORDER — LORAZEPAM 1 MG PO TABS: 1.0000 mg | ORAL_TABLET | ORAL | Status: AC | PRN

## 2017-08-19 NOTE — [facility] Group Notes (Signed)
  LCSW Group Therapy Note  @TD @ 1:00 PM  Type of Therapy/Topic:  Group Therapy:  Emotion Regulation  Participation Level:  Minimal   Description of Group:    The purpose of this group is to assist patients in learning to regulate negative emotions and experience positive emotions. Patients will be guided to discuss ways in which they have been vulnerable to their negative emotions. These vulnerabilities will be juxtaposed with experiences of positive emotions or situations, and patients will be challenged to use positive emotions to combat negative ones. Special emphasis will be placed on coping with negative emotions in conflict situations, and patients will process healthy conflict resolution skills.  Therapeutic Goals: 1. Patient will identify two positive emotions or experiences to reflect on in order to balance out negative emotions 2. Patient will label two or more emotions that they find the most difficult to experience 3. Patient will demonstrate positive conflict resolution skills through discussion and/or role plays  Summary of Patient Progress: Aurelio BrashJoey was able to identify some negative and positive emotions when given examples by CSW and other group members.  Emotions identified included feeling happy and sad.  He agreed with others in group that negative emotions have made him engage in negative behaviors in the past.      Therapeutic Modalities:   Cognitive Behavioral Therapy Feelings Identification Dialectal Behavioral Therapy

## 2017-08-19 NOTE — Progress Notes (Signed)
Patient is Voluntary admitted to the unit, this patient is known to us from his previous encounter with this hospital, patient also stated that after his discharge from here, he continued to have visual and auditory hallucinations that became worse that he started seeing demons and other unearthly things telling him to kill himself, admit financial issues as well that led to bench drinking of alcohol, patient has distended abdomen and morbid obese and has limited mobility, uses a walker to get around at this time. Patient contracted for safety and denied any harm to self and others. Body search is complete with two RNs no contraband was found. Skin is intact and no respiratory distress noted, food and fluids was offered and declined by pt..Marland Kitchen

## 2017-08-19 NOTE — Progress Notes (Addendum)
Clozapine monitoring  ANC 4.3. Per [employer] website patient is eligible to receive with weekly monitoring. Weekly CBC/diff ordered.  Fulton ReekMatt Jalecia Leon, PharmD, BCPS  08/19/17 12:49 AM
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
## 2017-08-19 NOTE — [facility] Suicide Risk Assessment (Signed)
[facility]   Nursing information obtained from:  Patient Demographic factors:  Male Current Mental Status:  Self-harm thoughts Loss Factors:  Financial problems / change in socioeconomic status Historical Factors:  NA Risk Reduction Factors:  Positive social support  Total Time spent with patient: 1 hour Principal Problem: Schizophrenia (HCC) Diagnosis:   Patient Active Problem List   Diagnosis Date Noted  . Schizophrenia (HCC) [F20.9] 08/19/2017  . 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 Schizophrenia, paranoid (HCC) [F20.0] 06/09/2017  . PTSD (post-traumatic stress disorder) [F43.10] 06/08/2017  . Inflamed external hemorrhoid [K64.4] 05/13/2017  . End stage liver disease (HCC) [K72.90] 04/24/2017  . Esophageal varices (HCC) [I85.00]   . Hematemesis [K92.0] 04/05/2017  . Upper GI bleed [K92.2]   . Iron deficiency anemia due to chronic blood loss [D50.0] 02/16/2017  . Nausea [R11.0] 01/18/2017  . Respiratory failure with hypoxia (HCC) [J96.91] 12/14/2016  . Seizures (HCC) [R56.9] 12/12/2016  . DNR (do not resuscitate) discussion [Z71.89] 08/11/2016  . Muscle weakness (generalized) [M62.81]   . GI bleed [K92.2] 08/07/2016  . OSA (obstructive sleep apnea) [G47.33] 04/29/2016  . Anemia [D64.9] 04/29/2016  . Thrombocytopenia (HCC) [D69.6] 04/29/2016  . Coagulopathy (HCC) [D68.9] 04/29/2016  . Obesity [E66.9] 04/29/2016  . Controlled type 2 diabetes mellitus without complication (HCC) [E11.9] 01/15/2016  . Essential (primary) hypertension [I10] 12/11/2015  . Pure hypercholesterolemia [E78.00] 12/11/2015  . Hepatic encephalopathy (HCC) [K72.90] 10/16/2015  . 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 Elevated transaminase level [R74.0] 07/04/2015  . Alcohol abuse [F10.10] 05/31/2015  . Paranoid schizophrenia (HCC) [F20.0] 03/20/2015   Subjective Data: See H&P  Continued Clinical Symptoms:  Alcohol Use  Disorder Identification Test Final Score (AUDIT): 4 The "Alcohol Use Disorders Identification Test", Guidelines for Use in Primary Care, Second Edition.  World Science writerHealth Organization [facility](WHO). Score between 0-7:  no or low risk or alcohol related problems. Score between 8-15:  moderate risk of alcohol related problems. Score between 16-19:  high risk of alcohol related problems. Score 20 or above:  warrants further diagnostic evaluation for alcohol dependence and treatment.   CLINICAL FACTORS:   Schizophrenia:   Command hallucinatons   Musculoskeletal: Strength & Muscle Tone: within normal limits Gait & Station: shuffle Patient leans: N/A  Psychiatric Specialty Exam: Physical Exam  ROS  Blood pressure (!) 146/82, pulse 90, temperature 97.7 F (36.5 C), temperature source Oral, resp. rate 20, height 6\' 3"  (1.905 m), weight (!) 154.7 kg (341 lb), SpO2 94 %.Body mass index is 42.62 kg/m.                                                          COGNITIVE FEATURES THAT CONTRIBUTE TO RISK:  Closed-mindedness, Polarized thinking and Thought constriction (tunnel vision)    SUICIDE RISK:   Moderate:  Frequent suicidal ideation with limited intensity, and duration, some specificity in terms of plans, no associated intent, good self-control, limited dysphoria/symptomatology, some risk factors present, and identifiable protective factors, including available and accessible social support.  PLAN OF CARE: See H&P  I certify that inpatient services furnished can reasonably be expected to improve the patient's condition.   Haskell RilingHolly R Hiba Garry, MD 08/19/2017, 3:37 PM

## 2017-08-19 NOTE — Progress Notes (Signed)
Recreation Therapy Notes  Date: 11.28.18  Time: 9:30 am  Location: Craft Room  Behavioral response: N/A  Intervention Topic: Stress  Discussion/Intervention: Patient did not attend group.   Clinical Observations/Feedback:  Patient did not attend group. Quentina Fronek LRT/CTRS         Howard [name] 08/19/2017 12:50 PM 

## 2017-08-19 NOTE — Tx Team (Signed)
Initial Treatment Plan 08/19/2017 1:49 AM Argil Kathreen DevoidArnold Sandra WUJ:[id number]    PATIENT STRESSORS: Financial difficulties Medication change or noncompliance Substance abuse   PATIENT STRENGTHS: Average or above average intelligence Motivation for treatment/growth Supportive family/friends   PATIENT IDENTIFIED PROBLEMS: Alcohol use (Bench drinking)    Depression, Due to financial difficulties    Liver disease ([place])             DISCHARGE CRITERIA:  Improved stabilization in mood, thinking, and/or behavior Motivation to continue treatment in a less acute level of care Safe-care adequate arrangements made  PRELIMINARY DISCHARGE PLAN: Attend aftercare/continuing care group Attend 12-step recovery group Return to previous living arrangement  PATIENT/FAMILY INVOLVEMENT: This treatment plan has been presented to and reviewed with the patient, Renard MatterJoey Arnold Washabaugh,   The patient  have been given the opportunity to ask questions and make suggestions.  Lelan PonsAlexander  Janaysia Mcleroy, RN 08/19/2017, 1:49 AM

## 2017-08-19 NOTE — Tx Team (Signed)
Interdisciplinary Treatment and Diagnostic Plan Update  08/19/2017 Time of Session: 11:30 AM Howard [name] MRN: [id number]  Principal Diagnosis: <principal problem not specified>  Secondary Diagnoses: Active Problems:   Schizophrenia ([place])   Current Medications:  Current Facility-Administered Medications  Medication Dose Route Frequency Provider Last Rate Last Dose  . acetaminophen (TYLENOL) tablet 650 mg  650 mg Oral Q6H PRN [name], [name], MD      . albuterol (PROVENTIL HFA;VENTOLIN HFA) 108 (90 Base) MCG/ACT inhaler 2 puff  2 puff Inhalation Q6H PRN [name], [name], MD      . alum & mag hydroxide-simeth (MAALOX/MYLANTA) 200-200-20 MG/5ML suspension 30 mL  30 mL Oral Q4H PRN [name], [name], MD      . cloZAPine (CLOZARIL) tablet 200 mg  200 mg Oral QHS [name], [name], MD      . ferrous sulfate tablet 325 mg  325 mg Oral Q breakfast [name], Madie Reno, MD   325 mg at 08/19/17 0839  . insulin aspart (novoLOG) injection 0-15 Units  0-15 Units Subcutaneous TID WC [name], [name], MD      . lactulose (CHRONULAC) 10 GM/15ML solution 30 g  30 g Oral TID [name], [name], MD   30 g at 08/19/17 1245  . levETIRAcetam (KEPPRA) tablet 1,500 mg  1,500 mg Oral BID [name], Madie Reno, MD   1,500 mg at 08/19/17 0840  . lithium carbonate capsule 300 mg  300 mg Oral QHS [name], [name], MD      . losartan (COZAAR) tablet 25 mg  25 mg Oral Daily [name], Madie Reno, MD   25 mg at 08/19/17 0840  . magnesium hydroxide (MILK OF MAGNESIA) suspension 30 mL  30 mL Oral Daily PRN [name], [name], MD      . metFORMIN (GLUCOPHAGE) tablet 500 mg  500 mg Oral BID WC [name], [name], MD   500 mg at 08/19/17 0839  . nadolol (CORGARD) tablet 20 mg  20 mg Oral Daily [name], Madie Reno, MD   20 mg at 08/19/17 0840  . pantoprazole (PROTONIX) EC tablet 40 mg  40 mg Oral Daily [name], Madie Reno, MD   40 mg at 08/19/17 0840  . rifaximin (XIFAXAN) tablet 550 mg  550 mg Oral BID [name], Madie Reno, MD   550 mg at 08/19/17 0840  .  sucralfate (CARAFATE) tablet 1 g  1 g Oral TID WC & HS [name], [name], MD   1 g at 08/19/17 1245  . thiamine (VITAMIN B-1) tablet 100 mg  100 mg Oral Daily [name], Madie Reno, MD   100 mg at 08/19/17 0839  . traZODone (DESYREL) tablet 100 mg  100 mg Oral QHS [name], [name], MD       PTA Medications: Medications Prior to Admission  Medication Sig Dispense Refill Last Dose  . albuterol (PROVENTIL HFA;VENTOLIN HFA) 108 (90 Base) MCG/ACT inhaler Inhale 2 puffs into the lungs every 4 (four) hours as needed for wheezing or shortness of breath. 1 Inhaler 3 Taking  . clozapine (CLOZARIL) 100 MG tablet Take 2 tablets (200 mg total) at bedtime by mouth. 60 tablet 2   . ferrous sulfate 325 (65 FE) MG tablet Take 1 tablet (325 mg total) by mouth daily with breakfast. 30 tablet 3 Taking  . folic acid (FOLVITE) 1 MG tablet Take 1 tablet (1 mg total) by mouth daily. 30 tablet 3 Taking  . hydrOXYzine (ATARAX/VISTARIL) 25 MG tablet Take 1 tablet (25 mg total) by  mouth 3 (three) times daily as needed for anxiety. 30 tablet 3 Taking  . ipratropium-albuterol (DUONEB) 0.5-2.5 (3) MG/3ML SOLN Take 3 mLs by nebulization every 6 (six) hours. 360 mL 1 Taking  . lactulose (CHRONULAC) 10 GM/15ML solution Take 67.5 mLs (45 g total) by mouth 3 (three) times daily. 240 mL 3 Taking  . levETIRAcetam (KEPPRA) 750 MG tablet Take 2 tablets (1,500 mg total) by mouth 2 (two) times daily. 120 tablet 3 Taking  . lithium carbonate (LITHOBID) 300 MG CR tablet Take 1 tablet (300 mg total) at bedtime by mouth. 30 tablet 1   . LORazepam (ATIVAN) 0.5 MG tablet Take 0.5 mg by mouth every 4 (four) hours as needed for anxiety.   Taking  . losartan (COZAAR) 25 MG tablet Take 1 tablet (25 mg total) by mouth daily. 30 tablet 3 Taking  . metFORMIN (GLUCOPHAGE) 500 MG tablet Take 1 tablet (500 mg total) by mouth 2 (two) times daily with a meal. 60 tablet 3 Taking  . nadolol (CORGARD) 20 MG tablet Take 1 tablet (20 mg total) by mouth daily. 30 tablet  3 Taking  . pantoprazole (PROTONIX) 40 MG tablet Take 1 tablet (40 mg total) by mouth daily. 30 tablet 3 Taking  . perphenazine (TRILAFON) 2 MG tablet Take 1 tablet (2 mg total) by mouth 2 (two) times daily. 60 tablet 0 Taking  . rifaximin (XIFAXAN) 550 MG TABS tablet Take 1 tablet (550 mg total) by mouth 2 (two) times daily. 60 tablet 3 Taking  . sucralfate (CARAFATE) 1 g tablet Take 1 tablet (1 g total) by mouth 4 (four) times daily -  with meals and at bedtime. 120 tablet 3 Taking  . thiamine 100 MG tablet Take 1 tablet (100 mg total) by mouth daily. 30 tablet 3 Taking  . traZODone (DESYREL) 100 MG tablet Take 100 mg by mouth at bedtime as needed for sleep.   Taking    Patient Stressors: Financial difficulties Medication change or noncompliance Substance abuse  Patient Strengths: Average or above average intelligence Motivation for treatment/growth Supportive family/friends  Treatment Modalities: Medication Management, Group therapy, Case management,  1 to 1 session with clinician, Psychoeducation, Recreational therapy.   Physician Treatment Plan for Primary Diagnosis: <principal problem not specified> Long Term Goal(s):     Short Term Goals:    Medication Management: Evaluate patient's response, side effects, and tolerance of medication regimen.  Therapeutic Interventions: 1 to 1 sessions, Unit Group sessions and Medication administration.  Evaluation of Outcomes: Not Met  Physician Treatment Plan for Secondary Diagnosis: Active Problems:   Schizophrenia ([place])  Long Term Goal(s):     Short Term Goals:       Medication Management: Evaluate patient's response, side effects, and tolerance of medication regimen.  Therapeutic Interventions: 1 to 1 sessions, Unit Group sessions and Medication administration.  Evaluation of Outcomes: Not Met   RN Treatment Plan for Primary Diagnosis: <principal problem not specified> Long Term Goal(s): Knowledge of disease and therapeutic  regimen to maintain health will improve  Short Term Goals: Ability to demonstrate self-control, Ability to identify and develop effective coping behaviors will improve and Compliance with prescribed medications will improve  Medication Management: RN will administer medications as ordered by provider, will assess and evaluate patient's response and provide education to patient for prescribed medication. RN will report any adverse and/or side effects to prescribing provider.  Therapeutic Interventions: 1 on 1 counseling sessions, Psychoeducation, Medication administration, Evaluate responses to treatment, Monitor vital signs and CBGs  as ordered, Perform/monitor CIWA, COWS, AIMS and Fall Risk screenings as ordered, Perform wound care treatments as ordered.  Evaluation of Outcomes: Not Met   LCSW Treatment Plan for Primary Diagnosis: <principal problem not specified> Long Term Goal(s): Safe transition to appropriate next level of care at discharge, Engage patient in therapeutic group addressing interpersonal concerns.  Short Term Goals: Engage patient in aftercare planning with referrals and resources, Increase social support and Increase skills for wellness and recovery  Therapeutic Interventions: Assess for all discharge needs, 1 to 1 time with Social worker, Explore available resources and support systems, Assess for adequacy in community support network, Educate family and significant other(s) on suicide prevention, Complete Psychosocial Assessment, Interpersonal group therapy.  Evaluation of Outcomes: Not Met   Progress in Treatment: Attending groups: Yes. Participating in groups: Yes. Taking medication as prescribed: Yes. Toleration medication: Yes. Family/Significant other contact made: No, will contact:  CSW will be contacting pt's mother. Patient understands diagnosis: Yes. Discussing patient identified problems/goals with staff: Yes. Medical problems stabilized or resolved:  Yes. Denies suicidal/homicidal ideation: Yes. Issues/concerns per patient self-inventory: No. Other: n/a  New problem(s) identified: No, Describe:  No new problems identified.  New Short Term/Long Term Goal(s):  Discharge Plan or Barriers:   Reason for Continuation of Hospitalization: Hallucinations Medication stabilization  Estimated Length of Stay: 3-5 days  Recreational Therapy: Patient Stressors: Drinking to stop the voices Patient Goal: Patient will improve communication skills, as demonstrated by ability to actively participate in at least 2 processing discussion during recreation therapy group sessions by conclusion of recreation therapy tx.  Attendees: Patient: Howard [name] 08/19/2017 3:06 PM  Physician: Amador Cunas, MD 08/19/2017 3:06 PM  Nursing: Mliss Fritz, Rn 08/19/2017 3:06 PM  RN Care Manager: 08/19/2017 3:06 PM  Social Worker: Derrek Gu, LCSW 08/19/2017 3:06 PM  Recreational Therapist: Roanna Epley, LRT 08/19/2017 3:06 PM  Other:  08/19/2017 3:06 PM  Other:  08/19/2017 3:06 PM  Other: 08/19/2017 3:06 PM    Scribe for Treatment Team: Devona Konig, LCSW 08/19/2017 3:06 PM

## 2017-08-19 NOTE — Progress Notes (Signed)
D- Patient alert and oriented. Patient presents in a depressed and anxious mood. Patient states that his depression is at a "4/10" and his anxiety level is at "8/10". Patient states that he is depressed and anxious because he's "scared, first time here" and "no end to this illness". Patient states that "I'll miss the first group" he wants to go rest for a while. Patient denies SI/HI and auditory hallucinations at this time. Patient endorses having a slight headache rated at "5/10" but did not request any pain medication at this time. Patient endorses auditory hallucinations reporting that the voices are "calling me bad names".  A- Scheduled medications administered to patient, per MD orders. Support and encouragement provided.  Routine safety checks conducted every 15 minutes.  Patient informed to notify staff with problems or concerns.  R- No adverse drug reactions noted. Patient contracts for safety at this time. Patient compliant with medications and treatment plan. Patient receptive, calm, and cooperative. Patient interacts well with others on the unit.  Patient remains safe at this time.

## 2017-08-19 NOTE — [facility] Group Notes (Signed)
[facility] Group Notes:  (Nursing/MHT/Case Management/Adjunct)  Date:  08/19/2017  Time:  9:32 PM  Type of Therapy:  Group Therapy  Participation Level:  Active  Participation Quality:  Appropriate  Affect:  Appropriate  Cognitive:  Alert  Insight:  Good  Engagement in Group:  Engaged  Modes of Intervention:  Support  Summary of Progress/Problems:  Mayra NeerJackie L Prim Morace 08/19/2017, 9:32 PM

## 2017-08-19 NOTE — Plan of Care (Signed)
Patient has the ability to function at an adequate level and he does know about his disease process. Patient condition is improving, his CBG levels have been within range and insulin has not been needed. Patient does understand the changes that need to be made in order for his condition to improve. Complications have been minimized up to this point. Patient has remained free from injury during this visit and is safe on the unit at this time.

## 2017-08-20 LAB — GLUCOSE, CAPILLARY
GLUCOSE-CAPILLARY: 99 mg/dL (ref 65–99)
GLUCOSE-CAPILLARY: 125 mg/dL — AB (ref 65–99)
Glucose-Capillary: 97 mg/dL (ref 65–99)
Glucose-Capillary: 79 mg/dL (ref 65–99)

## 2017-08-20 LAB — HEMOGLOBIN A1C
HEMOGLOBIN A1C: 6.2 % — AB (ref 4.8–5.6)
MEAN PLASMA GLUCOSE: 131 mg/dL

## 2017-08-20 NOTE — Plan of Care (Signed)
Pt. Able to remain safe on the unit. Pt. Able to participate in unit activities and be compliant with medications and educations. Pt. Upon interaction able to acknowledge reason for admission and actively reports wanting to improve. Pt. Participates in plan of care. Pt. Contracts verbally for safety.

## 2017-08-20 NOTE — Plan of Care (Signed)
Patient slept for Estimated Hours of 7.30; Precautionary checks every 15 minutes for safety maintained, room free of safety hazards, patient sustains no injury or falls during this shift.  

## 2017-08-20 NOTE — Progress Notes (Signed)
Patient ID: Howard [name], male   DOB: 04-Mar-1981, 36 y.o.   MRN: [id number] Slightly disheveled appearance, encouraged and attended group; childlike dependent behaviors and statements "I am going to my mama when I leave here..." Trembling lips...remains on falls precautions, uses a rolling walker to ambulate Still endorsing auditory hallucinations, "I still hear them, they are picking on me.. I am not going to hurt/harm myself.." Complied with medications at bedtime.

## 2017-08-20 NOTE — Progress Notes (Signed)
[facility] MD Progress Note  08/20/2017 11:16 AM Howard [name]  MRN:  [id number]   Subjective:  Pt is lying in bed when this provider went to see him. HE states that he is feeling slightly better today. He states that the voices are quieter. They are still telling him "very negative and mean things." However, they are no longer telling him to kill himself. He states that he has gone to all the groups today and they are helpful. We discussed again about group home. HE states that he would like to consider it but is hesitant. We discussed the benefits of this. He discussed how his brother is very controlling and it is hard to live with him. He states that he is feeling better physically and still weak but improving. He is staying hydrated and eating more today.   Principal Problem: Schizophrenia (HCC) Diagnosis:   Patient Active Problem List   Diagnosis Date Noted  . Schizophrenia (HCC) [F20.9] 08/19/2017  . 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 Schizophrenia, paranoid (HCC) [F20.0] 06/09/2017  . PTSD (post-traumatic stress disorder) [F43.10] 06/08/2017  . Inflamed external hemorrhoid [K64.4] 05/13/2017  . End stage liver disease (HCC) [K72.90] 04/24/2017  . Esophageal varices (HCC) [I85.00]   . Hematemesis [K92.0] 04/05/2017  . Upper GI bleed [K92.2]   . Iron deficiency anemia due to chronic blood loss [D50.0] 02/16/2017  . Nausea [R11.0] 01/18/2017  . Respiratory failure with hypoxia (HCC) [J96.91] 12/14/2016  . Seizures (HCC) [R56.9] 12/12/2016  . DNR (do not resuscitate) discussion [Z71.89] 08/11/2016  . Muscle weakness (generalized) [M62.81]   . GI bleed [K92.2] 08/07/2016  . OSA (obstructive sleep apnea) [G47.33] 04/29/2016  . Anemia [D64.9] 04/29/2016  . Thrombocytopenia (HCC) [D69.6] 04/29/2016  . Coagulopathy (HCC) [D68.9] 04/29/2016  . Obesity [E66.9] 04/29/2016  . Controlled type 2 diabetes mellitus without complication (HCC) [E11.9] 01/15/2016  .  Essential (primary) hypertension [I10] 12/11/2015  . Pure hypercholesterolemia [E78.00] 12/11/2015  . Hepatic encephalopathy (HCC) [K72.90] 10/16/2015  . 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 Elevated transaminase level [R74.0] 07/04/2015  . Alcohol abuse [F10.10] 05/31/2015  . Paranoid schizophrenia (HCC) [F20.0] 03/20/2015   Total Time spent with patient: 20 minutes  Past Psychiatric History: See H&P  Past Medical History:  Past Medical History:  Diagnosis Date  . Alcoholic cirrhosis of liver with ascites (HCC)   . Alcoholism (HCC)   . Anemia   . Atrial fibrillation (HCC)   . COPD (chronic obstructive pulmonary disease) (HCC)   . Depression   . Diabetes mellitus, type II (HCC)   . Esophageal varices (HCC)   . Heart disease    irregular heart beat (palpitations) and heart murmur  . Hyperlipemia   . Hypertension   . Liver disease   . Multiple thyroid nodules   . Portal hypertensive gastropathy (HCC)   . Schizophrenia (HCC)   . Seizures (HCC)     Past Surgical History:  Procedure Laterality Date  . ESOPHAGOGASTRODUODENOSCOPY N/A 10/05/2015   Procedure: ESOPHAGOGASTRODUODENOSCOPY (EGD);  Surgeon: Elnita Maxwell, MD;  Location: [facility] ENDOSCOPY;  Service: Endoscopy;  Laterality: N/A;  . ESOPHAGOGASTRODUODENOSCOPY N/A 06/30/2017   Procedure: ESOPHAGOGASTRODUODENOSCOPY (EGD);  Surgeon: Toney Reil, MD;  Location: [facility] ENDOSCOPY;  Service: Gastroenterology;  Laterality: N/A;  . ESOPHAGOGASTRODUODENOSCOPY (EGD) WITH PROPOFOL N/A 08/03/2015   Procedure: ESOPHAGOGASTRODUODENOSCOPY (EGD) WITH PROPOFOL;  Surgeon: Elnita Maxwell, MD;  Location: [facility] ENDOSCOPY;  Service: Endoscopy;  Laterality: N/A;  . ESOPHAGOGASTRODUODENOSCOPY (EGD) WITH PROPOFOL N/A  08/31/2015   Procedure: ESOPHAGOGASTRODUODENOSCOPY (EGD) WITH PROPOFOL;  Surgeon: Elnita MaxwellMatthew Gordon Rein, MD;  [facility];  Service: Endoscopy;  Laterality: N/A;  .  ESOPHAGOGASTRODUODENOSCOPY (EGD) WITH PROPOFOL N/A 04/04/2016   Procedure: ESOPHAGOGASTRODUODENOSCOPY (EGD) WITH PROPOFOL;  Surgeon: Scot Junobert T Elliott, MD;  [facility] END[facility]COPY;  Service: Endoscopy;  Laterality: N/A;  . ESOPHAGOGASTRODUODENOSCOPY (EGD) WITH PROPOFOL N/A 11/13/2016   Procedure: ESOPHAGOGASTRODUODENOSCOPY (EGD) WITH PROPOFOL;  Surgeon: Wyline MoodKiran Anna, MD;  Location: [facility] ENDOSCOPY;  Service: Endoscopy;  Laterality: N/A;  . ESOPHAGOGASTRODUODENOSCOPY (EGD) WITH PROPOFOL N/A 04/07/2017   Procedure: ESOPHAGOGASTRODUODENOSCOPY (EGD) WITH PROPOFOL;  Surgeon: Midge MiniumWohl, Darren, MD;  Location: [facility] ENDOSCOPY;  Service: Endoscopy;  Laterality: N/A;  . NO PAST SURGERIES     Family History:  Family History  Problem Relation Age of Onset  . Heart disease Mother   . Hypertension Mother   . Hyperlipidemia Mother   . Stroke Father   . Heart attack Father   . Hypertension Father   . Heart disease Father   . Alcohol abuse Father   . Heart disease Brother    Family Psychiatric  History: See H&P Social History:  Social History   Substance and Sexual Activity  Alcohol Use Yes  . Alcohol/week: 0.0 oz   Comment: last drink 1 days ago (noted: 05/30/2017)     Social History   Substance and Sexual Activity  Drug Use No    Social History   Socioeconomic History  . Marital status: Single    Spouse name: None  . Number of children: None  . Years of education: None  . Highest education level: None  Social Needs  . Financial resource strain: None  . Food insecurity - worry: None  . Food insecurity - inability: None  . Transportation needs - medical: None  . Transportation needs - non-medical: None  Occupational History  . Occupation: disabled  Tobacco Use  . Smoking status: Never Smoker  . Smokeless tobacco: Never Used  Substance and Sexual Activity  . Alcohol use: Yes    Alcohol/week: 0.0 oz    Comment: last drink 1 days ago (noted: 05/30/2017)  . Drug use: No  . Sexual activity:  No    Birth control/protection: None  Other Topics Concern  . None  Social History Narrative   ** Merged History Encounter **       Additional Social History:                         Sleep: Fair  Appetite:  Fair  Current Medications: Current Facility-Administered Medications  Medication Dose Route Frequency Provider Last Rate Last Dose  . acetaminophen (TYLENOL) tablet 650 mg  650 mg Oral Q6H PRN [name], [name], MD      . albuterol (PROVENTIL HFA;VENTOLIN HFA) 108 (90 Base) MCG/ACT inhaler 2 puff  2 puff Inhalation Q6H PRN [name], [name], MD      . alum & mag hydroxide-simeth (MAALOX/MYLANTA) 200-200-20 MG/5ML suspension 30 mL  30 mL Oral Q4H PRN [name], [name], MD      . cloZAPine (CLOZARIL) tablet 200 mg  200 mg Oral QHS [name], [name], MD   200 mg at 08/19/17 2122  . ferrous sulfate tablet 325 mg  325 mg Oral Q breakfast [name], Jackquline DenmarkJohn T, MD   325 mg at 08/20/17 0831  . gabapentin (NEURONTIN) capsule 900 mg  900 mg Oral TID Haskell RilingMcNew, Remberto Lienhard R, MD   900 mg at 08/20/17 0831  .  insulin aspart (novoLOG) injection 0-15 Units  0-15 Units Subcutaneous TID WC [name], [name], MD      . lactulose (CHRONULAC) 10 GM/15ML solution 30 g  30 g Oral TID [name], Jackquline DenmarkJohn T, MD   30 g at 08/20/17 [id number]  . levETIRAcetam (KEPPRA) tablet 1,500 mg  1,500 mg Oral BID [name], Jackquline DenmarkJohn T, MD   1,500 mg at 08/20/17 0831  . lithium carbonate capsule 300 mg  300 mg Oral QHS [name], Jackquline DenmarkJohn T, MD   300 mg at 08/19/17 2122  . loperamide (IMODIUM) capsule 2-4 mg  2-4 mg Oral PRN Rayden Scheper, Ileene HutchinsonHolly R, MD      . LORazepam (ATIVAN) tablet 1 mg  1 mg Oral Q2H PRN Glorian Mcdonell, Ileene HutchinsonHolly R, MD      . losartan (COZAAR) tablet 25 mg  25 mg Oral Daily [name], Jackquline DenmarkJohn T, MD   25 mg at 08/20/17 [id number]  . magnesium hydroxide (MILK OF MAGNESIA) suspension 30 mL  30 mL Oral Daily PRN [name], [name], MD      . metFORMIN (GLUCOPHAGE) tablet 500 mg  500 mg Oral BID WC [name], Jackquline DenmarkJohn T, MD   500 mg at 08/20/17 [id number]  . multivitamin with minerals  tablet 1 tablet  1 tablet Oral Daily Linnette Panella, Ileene HutchinsonHolly R, MD   1 tablet at 08/20/17 0831  . nadolol (CORGARD) tablet 20 mg  20 mg Oral Daily [name], Jackquline DenmarkJohn T, MD   20 mg at 08/20/17 [id number]  . ondansetron (ZOFRAN-ODT) disintegrating tablet 4 mg  4 mg Oral Q6H PRN Edyn Qazi, Ileene HutchinsonHolly R, MD      . pantoprazole (PROTONIX) EC tablet 40 mg  40 mg Oral Daily [name], Jackquline DenmarkJohn T, MD   40 mg at 08/20/17 0832  . rifaximin (XIFAXAN) tablet 550 mg  550 mg Oral BID [name], Jackquline DenmarkJohn T, MD   550 mg at 08/20/17 [id number]  . sucralfate (CARAFATE) tablet 1 g  1 g Oral TID WC & HS [name], Jackquline DenmarkJohn T, MD   1 g at 08/20/17 [id number]  . thiamine (VITAMIN B-1) tablet 100 mg  100 mg Oral Daily [name], Jackquline DenmarkJohn T, MD   100 mg at 08/19/17 0839  . thiamine (VITAMIN B-1) tablet 100 mg  100 mg Oral Daily Indy Prestwood, Ileene HutchinsonHolly R, MD   100 mg at 08/20/17 0831  . traZODone (DESYREL) tablet 100 mg  100 mg Oral QHS [name], Jackquline DenmarkJohn T, MD   100 mg at 08/19/17 2122    Lab Results:  Results for orders placed or performed during the hospital encounter of 08/19/17 (from the past 48 hour(s))  Glucose, capillary     Status: None   Collection Time: 08/19/17  1:58 AM  Result Value Ref Range   Glucose-Capillary 92 65 - 99 mg/dL   Comment 1 Notify RN   Hemoglobin A1c     Status: Abnormal   Collection Time: 08/19/17  6:44 AM  Result Value Ref Range   Hgb A1c MFr Bld 6.2 (H) 4.8 - 5.6 %    Comment: (NOTE)         Prediabetes: 5.7 - 6.4         Diabetes: >6.4         Glycemic control for adults with diabetes: <7.0    Mean Plasma Glucose 131 mg/dL    Comment: (NOTE) Performed At: [facility] [address] [place], [place] [id number] Jolene SchimkeNagendra Sanjai MD UX:[phone]   Lipid panel     Status: Abnormal   Collection Time: 08/19/17  6:44 AM  Result Value Ref Range   Cholesterol 154 0 - 200 mg/dL   Triglycerides 71 <161 mg/dL   HDL 28 (L) >09 mg/dL   Total CHOL/HDL Ratio 5.5 RATIO   VLDL 14 0 - 40 mg/dL   LDL Cholesterol 604 (H) 0 - 99 mg/dL    Comment:        Total  Cholesterol/HDL:CHD Risk Coronary Heart Disease Risk Table                     Men   Women  1/2 Average Risk   3.4   3.3  Average Risk       5.0   4.4  2 X Average Risk   9.6   7.1  3 X Average Risk  23.4   11.0        Use the calculated Patient Ratio above and the CHD Risk Table to determine the patient's CHD Risk.        ATP III CLASSIFICATION (LDL):  <100     mg/dL   Optimal  540-981  mg/dL   Near or Above                    Optimal  130-159  mg/dL   Borderline  191-478  mg/dL   High  >295     mg/dL   Very High   TSH     Status: None   Collection Time: 08/19/17  6:44 AM  Result Value Ref Range   TSH 3.303 0.350 - 4.500 uIU/mL    Comment: Performed by a 3rd Generation assay with a functional sensitivity of <=0.01 uIU/mL.  Glucose, capillary     Status: Abnormal   Collection Time: 08/19/17  8:25 AM  Result Value Ref Range   Glucose-Capillary 110 (H) 65 - 99 mg/dL  Glucose, capillary     Status: Abnormal   Collection Time: 08/19/17  1:07 PM  Result Value Ref Range   Glucose-Capillary 118 (H) 65 - 99 mg/dL  Glucose, capillary     Status: Abnormal   Collection Time: 08/19/17  5:15 PM  Result Value Ref Range   Glucose-Capillary 107 (H) 65 - 99 mg/dL  Glucose, capillary     Status: None   Collection Time: 08/19/17  8:33 PM  Result Value Ref Range   Glucose-Capillary 85 65 - 99 mg/dL  Glucose, capillary     Status: None   Collection Time: 08/20/17  7:15 AM  Result Value Ref Range   Glucose-Capillary 97 65 - 99 mg/dL   Comment 1 Notify RN     Blood Alcohol level:  Lab Results  Component Value Date   ETH <10 08/18/2017   ETH <10 07/08/2017    Metabolic Disorder Labs: Lab Results  Component Value Date   HGBA1C 6.2 (H) 08/19/2017   MPG 131 08/19/2017   MPG 116.89 06/10/2017   No results found for: PROLACTIN Lab Results  Component Value Date   CHOL 154 08/19/2017   TRIG 71 08/19/2017   HDL 28 (L) 08/19/2017   CHOLHDL 5.5 08/19/2017   VLDL 14 08/19/2017    LDLCALC 112 (H) 08/19/2017   LDLCALC 143 (H) 06/10/2017    Physical Findings: AIMS: Facial and Oral Movements Muscles of Facial Expression: None, normal Lips and Perioral Area: None, normal Jaw: None, normal Tongue: None, normal,Extremity Movements Upper (arms, wrists, hands, fingers): None, normal Lower (legs, knees, ankles, toes): None, normal, Trunk Movements Neck, shoulders, hips: None, normal, Overall Severity Severity  of abnormal movements (highest score from questions above): None, normal Incapacitation due to abnormal movements: None, normal Patient's awareness of abnormal movements (rate only patient's report): No Awareness, Dental Status Current problems with teeth and/or dentures?: No Does patient usually wear dentures?: No  CIWA:  CIWA-Ar Total: 0 COWS:     Musculoskeletal: Strength & Muscle Tone: decreased Gait & Station: shuffle Patient leans: N/A  Psychiatric Specialty Exam: Physical Exam  Nursing note and vitals reviewed.   Review of Systems  All other systems reviewed and are negative.   Blood pressure (!) 145/88, pulse 90, temperature (!) 97.5 F (36.4 C), resp. rate 20, height 6\' 3"  (1.905 m), weight (!) 154.7 kg (341 lb), SpO2 94 %.Body mass index is 42.62 kg/m.  General Appearance: Disheveled  Eye Contact:  Fair  Speech:  Clear and Coherent  Volume:  Decreased  Mood:  Depressed  Affect:  Congruent  Thought Process:  Goal Directed, concrete  Orientation:  Full (Time, Place, and Person)  Thought Content:  Hallucinations: Auditory Visual  Suicidal Thoughts:  No  Homicidal Thoughts:  No  Memory:  Immediate;   Fair  Judgement:  Poor  Insight:  Lacking  Psychomotor Activity:  NA  Concentration:  Attention Span: Poor  Recall:  [employer] of Knowledge:  Fair  Language:  Fair        Assets:  Resilience  ADL's:  Intact  Cognition:  WNL  Sleep:  Number of Hours: 7.3     Treatment Plan Summary:36 yo male with history of schizophrenia and  alcohol abuse admitted due to worsening AH/VH. Pt reports feeling slightly better although still has AH. HE is considering group home option. We discussed this in detail with him that this would be very beneficial for him to have more support as he is at high risk of death due to excessive alcohol use and cirrhosis.  Plan:  Schizophrenia -Continue Clozapine 200 mg qhs -Weekly ANC  Mood -Continue Lithium 300 mg qhs  HTN -Continue Nadolol and losartan  DM -Metformin 500 mg BID  Cirrhosis -Lactulose, Ferrous Sulfate  Seizure disorder -Keppra 1500 mg BID  Alcohol abuse -Continue CIWA with Ativan  Dispo -Will attempt to pursue group home for pt on discharge. He is still willing to consider this  Haskell Riling, MD 08/20/2017, 11:17 AM

## 2017-08-20 NOTE — Plan of Care (Signed)
Patient up with walker. He is steady with his walker. He is med compliant. He states his mom bought the beer. He just cant stop drinking. We spoke about liver damage and the effects of alcohol. He denies si,  hi, avh. But has depression and feelings of guilt for drinking.

## 2017-08-20 NOTE — [facility] Group Notes (Signed)
08/20/2017 1PM Type of Therapy/Topic:  Group Therapy:  Balance in Life  Participation Level:  Did Not Attend  Description of Group:   This group will address the concept of balance and how it feels and looks when one is unbalanced. Patients will be encouraged to process areas in their lives that are out of balance and identify reasons for remaining unbalanced. Facilitators will guide patients in utilizing problem-solving interventions to address and correct the stressor making their life unbalanced. Understanding and applying boundaries will be explored and addressed for obtaining and maintaining a balanced life. Patients will be encouraged to explore ways to assertively make their unbalanced needs known to significant others in their lives, using other group members and facilitator for support and feedback.  Therapeutic Goals: 1. Patient will identify two or more emotions or situations they have that consume much of in their lives. 2. Patient will identify signs/triggers that life has become out of balance:  3. Patient will identify two ways to set boundaries in order to achieve balance in their lives:  4. Patient will demonstrate ability to communicate their needs through discussion and/or role plays  Summary of Patient Progress:  Did not attend      Therapeutic Modalities:   Cognitive Behavioral Therapy Solution-Focused Therapy Assertiveness Training  Ambriel Gorelick, LCSW 

## 2017-08-20 NOTE — Progress Notes (Signed)
Recreation Therapy Notes  Date: 11.29.18  Time: 9:30 am  Location: Craft Room  Behavioral response: Appropriate  Intervention Topic: Anger  Discussion/Intervention: Group content on today was focused on anger management. The group defined anger and reasons they become angry. Individuals expressed negative way they have dealt with anger in the past. Patients stated some positive ways they could deal with anger in the future. The group described how anger can affect your health and daily plans. Individuals participated in the intervention "Filling the cup" where they had a chance to see a visual of how angry they normally get.   Clinical Observations/Feedback:  Patient came to group and was focused on what his peers and staff had to say about the topic at hand. He participated in the intervention and stated he did not know how angry he gets at times. Keagen Heinlen LRT/CTRS         Edrei Norgaard 08/20/2017 11:10 AM

## 2017-08-20 NOTE — [facility] Suicide Risk Assessment (Signed)
[facility] INPATIENT:  Family/Significant Other Suicide Prevention Education  Suicide Prevention Education:  Education Completed, Howard GeraldsBetty [name] [phone]) has been identified by the patient as the family member with whom the patient will be residing, and identified as the person(s) who will aid the patient in the event of a mental health crisis (suicidal ideations/suicide attempt).  With written consent from the patient, the family member has been provided the following suicide prevention education, prior to the and/or following the discharge of the patient.  The suicide prevention education provided includes the following:  Suicide risk factors  Suicide prevention and interventions  National Suicide Hotline telephone number  [facility] assessment telephone number  [facility] Emergency Assistance 911  [facility] and/or Residential Mobile Crisis Unit telephone number  Request made of family/significant other to:  Remove weapons (e.g., guns, rifles, knives), all items previously/currently identified as safety concern.    Remove drugs/medications (over-the-counter, prescriptions, illicit drugs), all items previously/currently identified as a safety concern.  The family member/significant other verbalizes understanding of the suicide prevention education information provided.  The family member informed CSW that there are no weapons located in the home.  Alease FrameSonya S Elzina Devera, LCSW 08/20/2017, 3:13 PM

## 2017-08-20 NOTE — Progress Notes (Signed)
Recreation Therapy Notes  INPATIENT RECREATION THERAPY ASSESSMENT  Patient Details Name: Renard MatterJoey Arnold Stenzel MRN: [id number] DOB: 09/06/1981 Today's Date: 08/20/2017  Patient Stressors: Other (Comment)(Drinking to stop the voices)  Coping Skills:   Substance Abuse, Music  Personal Challenges: Communication, Expressing Yourself, Problem-Solving, Social Interaction, Stress Management, Substance Abuse, Trusting Others  Leisure Interests (2+):  Individual - TV  Awareness of Community Resources:  No  Community Resources:     Current Use:    If no, Barriers?:    Patient Strengths:  play guitar, sing  Patient Identified Areas of Improvement:  take my medicine right  Current Recreation Participation:  None  Patient Goal for Hospitalization:  Learnto deal with stress and get into a group home  [place]  County of Residence:  [place]   Current SI (including self-harm):  No  Current HI:  No  Consent to Intern Participation: N/A   Armenta Erskin 08/20/2017, 10:51 AM

## 2017-08-20 NOTE — Progress Notes (Signed)
D:Pt. Denies SI/HI, but reports AVH. Pt. When asked about his auditory and visual hallucinations he has states, "I hear demons telling me to kill myself when I hear voices and when I see things the demons I see just tell me why I should do it". Pt. Verbally contracts for safety and affirms that, "I definetly don't want to kill myself". Pt. Upon interaction is pleasant and cooperative and actively participates in conversation.   A: Q x 15 minute observation checks were completed for safety. Patient was provided with education. Patient was given scheduled medications. Patient  was encourage to attend groups, participate in unit activities and continue with plan of care.   R:Patient is complaint with medication and unit procedures. Pt. Frequently seen socializing with peers and up from his room.             Patient slept for Estimated Hours of 5.30; Precautionary checks every 15 minutes for safety maintained, room free of safety hazards, patient sustains no injury or falls during this shift.

## 2017-08-20 NOTE — Progress Notes (Signed)
Recreation Therapy Notes         Howard [name] 08/20/2017 3:57 PM

## 2017-08-21 LAB — GLUCOSE, CAPILLARY
GLUCOSE-CAPILLARY: 113 mg/dL — AB (ref 65–99)
GLUCOSE-CAPILLARY: 81 mg/dL (ref 65–99)
Glucose-Capillary: 101 mg/dL — ABNORMAL HIGH (ref 65–99)
Glucose-Capillary: 94 mg/dL (ref 65–99)

## 2017-08-21 LAB — CLOZAPINE (CLOZARIL)
Clozapine Lvl: 488 ng/mL (ref 350–650)
NORCLOZAPINE: 134 ng/mL
TOTAL(CLOZ+ NORCLOZ): 622 ng/mL

## 2017-08-21 NOTE — Plan of Care (Signed)
Patient states he is sleepy today but that he will go to groups. He eats his meals and and his blood sugars have been stable. He is childlike but med compliant. He is compliant with using his walker. He does have passive si, but is safe in here. He states sometimes he hears voices but not right now. Will continue to monitor.

## 2017-08-21 NOTE — [facility] Group Notes (Signed)
[facility] Group Notes:  (Nursing/MHT/Case Management/Adjunct)  Date:  08/20/2017  Time:  8:30 PM  Type of Therapy:  Psychoeducational Skills  Participation Level:  Active  Participation Quality:  Appropriate, Attentive and Supportive  Affect:  Appropriate  Cognitive:  Appropriate  Insight:  Appropriate and Good  Engagement in Group:  Engaged  Modes of Intervention:  Discussion, Socialization and Support  Summary of Progress/Problems:  Howard [name] 08/21/2017, 7:29 PM

## 2017-08-21 NOTE — Progress Notes (Addendum)
[facility]  08/21/2017 2:29 PM Howard [name]  MRN:  [id number]  Subjective:   [name] reports further improvement. His mood is good and hallucinations are not as mean as before. He is up and out of his room today walking with a walker and interacting with peers. He denies symptoms of alcohol withdrawal. Sleep and appetite are good. We had yet another conversation about placement in a group home. He is almost convinced but then remembers that he needs to pay his brother $100 each moth and another $100 for a storage shed where he keeps his motorbike he has been working on.  Treatment pla. We continue Clozapine, Lithium, Neurontin, Keppra and Trazodone for psychosis, mood stabilization and seizures. Clozapine level on 11/28 was 488.  Socia/dispositio. He lives with his mother and brother where he has easy access to alcohol drinking himself to death. Refuses placement. He follows up with Dr. Toni [name].   Principal Problem: Schizophrenia (HCC) Diagnosis:   Patient Active Problem List   Diagnosis Date Noted  . Schizophrenia (HCC) [F20.9] 08/19/2017  . 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 Schizophrenia, paranoid (HCC) [F20.0] 06/09/2017  . PTSD (post-traumatic stress disorder) [F43.10] 06/08/2017  . Inflamed external hemorrhoid [K64.4] 05/13/2017  . End stage liver disease (HCC) [K72.90] 04/24/2017  . Esophageal varices (HCC) [I85.00]   . Hematemesis [K92.0] 04/05/2017  . Upper GI bleed [K92.2]   . Iron deficiency anemia due to chronic blood loss [D50.0] 02/16/2017  . Nausea [R11.0] 01/18/2017  . Respiratory failure with hypoxia (HCC) [J96.91] 12/14/2016  . Seizures (HCC) [R56.9] 12/12/2016  . DNR (do not resuscitate) discussion [Z71.89] 08/11/2016  . Muscle weakness (generalized) [M62.81]   . GI bleed [K92.2] 08/07/2016  . OSA (obstructive sleep apnea) [G47.33] 04/29/2016  . Anemia [D64.9] 04/29/2016  . Thrombocytopenia (HCC) [D69.6] 04/29/2016  .  Coagulopathy (HCC) [D68.9] 04/29/2016  . Obesity [E66.9] 04/29/2016  . Controlled type 2 diabetes mellitus without complication (HCC) [E11.9] 01/15/2016  . Essential (primary) hypertension [I10] 12/11/2015  . Pure hypercholesterolemia [E78.00] 12/11/2015  . Hepatic encephalopathy (HCC) [K72.90] 10/16/2015  . 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 Elevated transaminase level [R74.0] 07/04/2015  . Alcohol abuse [F10.10] 05/31/2015  . Paranoid schizophrenia (HCC) [F20.0] 03/20/2015   Total Time spent with patient: 20 minutes  Past Psychiatric History: schizophrenia, alcoholism.  Past Medical History:  Past Medical History:  Diagnosis Date  . Alcoholic cirrhosis of liver with ascites (HCC)   . Alcoholism (HCC)   . Anemia   . Atrial fibrillation (HCC)   . COPD (chronic obstructive pulmonary disease) (HCC)   . Depression   . Diabetes mellitus, type II (HCC)   . Esophageal varices (HCC)   . Heart disease    irregular heart beat (palpitations) and heart murmur  . Hyperlipemia   . Hypertension   . Liver disease   . Multiple thyroid nodules   . Portal hypertensive gastropathy (HCC)   . Schizophrenia (HCC)   . Seizures (HCC)     Past Surgical History:  Procedure Laterality Date  . ESOPHAGOGASTRODUODENOSCOPY N/A 10/05/2015   Procedure: ESOPHAGOGASTRODUODENOSCOPY (EGD);  Surgeon: Elnita MaxwellMatthew Gordon Rein, MD;  [facility];  Service: Endoscopy;  Laterality: N/A;  . ESOPHAGOGASTRODUODENOSCOPY N/A 06/30/2017   Procedure: ESOPHAGOGASTRODUODENOSCOPY (EGD);  Surgeon: Toney ReilVanga, Rohini Reddy, MD;  [facility];  Service: Gastroenterology;  Laterality: N/A;  . ESOPHAGOGASTRODUODENOSCOPY (EGD) WITH PROPOFOL N/A 08/03/2015   Procedure: ESOPHAGOGASTRODUODENOSCOPY (EGD) WITH PROPOFOL;  Surgeon: Elnita MaxwellMatthew Gordon Rein, MD;  Location: [facility] ENDOSCOPY;  Service: Endoscopy;  Laterality: N/A;  . ESOPHAGOGASTRODUODENOSCOPY (EGD) WITH PROPOFOL N/A 08/31/2015    Procedure: ESOPHAGOGASTRODUODENOSCOPY (EGD) WITH PROPOFOL;  Surgeon: Elnita Maxwell, MD;  Location: [facility] ENDOSCOPY;  Service: Endoscopy;  Laterality: N/A;  . ESOPHAGOGASTRODUODENOSCOPY (EGD) WITH PROPOFOL N/A 04/04/2016   Procedure: ESOPHAGOGASTRODUODENOSCOPY (EGD) WITH PROPOFOL;  Surgeon: Scot Jun, MD;  Location[facility] ENDOSCOPY;  Service: Endoscopy;  Laterality: N/A;  . ESOPHAGOGASTRODUODENOSCOPY (EGD) WITH PROPOFOL N/A 11/13/2016   Procedure: ESOPHAGOGASTRODUODENOSCOPY (EGD) WITH PROPOFOL;  Surgeon: Wyline Mood, MD;  Location: [facility] ENDOSCOPY;  Service: Endoscopy;  Laterality: N/A;  . ESOPHAGOGASTRODUODENOSCOPY (EGD) WITH PROPOFOL N/A 04/07/2017   Procedure: ESOPHAGOGASTRODUODENOSCOPY (EGD) WITH PROPOFOL;  Surgeon: Midge Minium, MD;  Location: [facility] ENDOSCOPY;  Service: Endoscopy;  Laterality: N/A;  . NO PAST SURGERIES     Family History:  Family History  Problem Relation Age of Onset  . Heart disease Mother   . Hypertension Mother   . Hyperlipidemia Mother   . Stroke Father   . Heart attack Father   . Hypertension Father   . Heart disease Father   . Alcohol abuse Father   . Heart disease Brother    Family Psychiatric  History: alcoholic father Social History:  Social History   Substance and Sexual Activity  Alcohol Use Yes  . Alcohol/week: 0.0 oz   Comment: last drink 1 days ago (noted: 05/30/2017)     Social History   Substance and Sexual Activity  Drug Use No    Social History   Socioeconomic History  . Marital status: Single    Spouse name: None  . Number of children: None  . Years of education: None  . Highest education level: None  Social Needs  . Financial resource strain: None  . Food insecurity - worry: None  . Food insecurity - inability: None  . Transportation needs - medical: None  . Transportation needs - non-medical: None  Occupational History  . Occupation: disabled  Tobacco Use  . Smoking status: Never Smoker  . Smokeless tobacco: Never  Used  Substance and Sexual Activity  . Alcohol use: Yes    Alcohol/week: 0.0 oz    Comment: last drink 1 days ago (noted: 05/30/2017)  . Drug use: No  . Sexual activity: No    Birth control/protection: None  Other Topics Concern  . None  Social History Narrative   ** Merged History Encounter **       Additional Social History:                         Sleep: Fair  Appetite:  Fair  Current Medications: Current Facility-Administered Medications  Medication Dose Route Frequency Provider Last Rate Last Dose  . acetaminophen (TYLENOL) tablet 650 mg  650 mg Oral Q6H PRN [name], [name], MD      . albuterol (PROVENTIL HFA;VENTOLIN HFA) 108 (90 Base) MCG/ACT inhaler 2 puff  2 puff Inhalation Q6H PRN [name], [name], MD      . alum & mag hydroxide-simeth (MAALOX/MYLANTA) 200-200-20 MG/5ML suspension 30 mL  30 mL Oral Q4H PRN [name], [name], MD      . cloZAPine (CLOZARIL) tablet 200 mg  200 mg Oral QHS [name], [name], MD   200 mg at 08/20/17 2125  . ferrous sulfate tablet 325 mg  325 mg Oral Q breakfast [name], [name], MD   325 mg at 08/21/17 0811  . gabapentin (NEURONTIN) capsule 900 mg  900  mg Oral TID Haskell Riling, MD   900 mg at 08/21/17 1200  . insulin aspart (novoLOG) injection 0-15 Units  0-15 Units Subcutaneous TID WC [name], [name], MD      . lactulose (CHRONULAC) 10 GM/15ML solution 30 g  30 g Oral TID [name], [name], MD   30 g at 08/21/17 1200  . levETIRAcetam (KEPPRA) tablet 1,500 mg  1,500 mg Oral BID [name], [name], MD   1,500 mg at 08/21/17 0811  . lithium carbonate capsule 300 mg  300 mg Oral QHS [name], [name], MD   300 mg at 08/20/17 2126  . loperamide (IMODIUM) capsule 2-4 mg  2-4 mg Oral PRN McNew, Ileene Hutchinson, MD      . LORazepam (ATIVAN) tablet 1 mg  1 mg Oral Q2H PRN McNew, Ileene Hutchinson, MD      . losartan (COZAAR) tablet 25 mg  25 mg Oral Daily [name], [name], MD   25 mg at 08/21/17 0810  . magnesium hydroxide (MILK OF MAGNESIA) suspension 30 mL  30 mL Oral  Daily PRN [name], [name], MD      . metFORMIN (GLUCOPHAGE) tablet 500 mg  500 mg Oral BID WC [name], [name], MD   500 mg at 08/20/17 1658  . multivitamin with minerals tablet 1 tablet  1 tablet Oral Daily McNew, Ileene Hutchinson, MD   1 tablet at 08/21/17 0810  . nadolol (CORGARD) tablet 20 mg  20 mg Oral Daily [name], [name], MD   20 mg at 08/21/17 0810  . ondansetron (ZOFRAN-ODT) disintegrating tablet 4 mg  4 mg Oral Q6H PRN McNew, Ileene Hutchinson, MD      . pantoprazole (PROTONIX) EC tablet 40 mg  40 mg Oral Daily [name], [name], MD   40 mg at 08/21/17 0810  . rifaximin (XIFAXAN) tablet 550 mg  550 mg Oral BID [name], [name], MD   550 mg at 08/21/17 0811  . sucralfate (CARAFATE) tablet 1 g  1 g Oral TID WC & HS [name], [name], MD   1 g at 08/21/17 1201  . thiamine (VITAMIN B-1) tablet 100 mg  100 mg Oral Daily [name], [name], MD   100 mg at 08/19/17 0839  . thiamine (VITAMIN B-1) tablet 100 mg  100 mg Oral Daily McNew, Ileene Hutchinson, MD   100 mg at 08/21/17 1610  . traZODone (DESYREL) tablet 100 mg  100 mg Oral QHS [name], [name], MD   100 mg at 08/20/17 2126    Lab Results:  Results for orders placed or performed during the hospital encounter of 08/19/17 (from the past 48 hour(s))  Glucose, capillary     Status: Abnormal   Collection Time: 08/19/17  5:15 PM  Result Value Ref Range   Glucose-Capillary 107 (H) 65 - 99 mg/dL  Glucose, capillary     Status: None   Collection Time: 08/19/17  8:33 PM  Result Value Ref Range   Glucose-Capillary 85 65 - 99 mg/dL  Glucose, capillary     Status: None   Collection Time: 08/20/17  7:15 AM  Result Value Ref Range   Glucose-Capillary 97 65 - 99 mg/dL   Comment 1 Notify RN   Glucose, capillary     Status: None   Collection Time: 08/20/17 11:25 AM  Result Value Ref Range   Glucose-Capillary 99 65 - 99 mg/dL  Glucose, capillary     Status: None   Collection Time: 08/20/17  4:25 PM  Result Value Ref Range   Glucose-Capillary 79 65 - 99 mg/dL   Comment 1  Notify RN   Glucose, capillary     Status: Abnormal   Collection Time: 08/20/17  9:25 PM  Result Value Ref Range   Glucose-Capillary 125 (H) 65 - 99 mg/dL  Glucose, capillary     Status: Abnormal   Collection Time: 08/21/17  7:02 AM  Result Value Ref Range   Glucose-Capillary 101 (H) 65 - 99 mg/dL  Glucose, capillary     Status: Abnormal   Collection Time: 08/21/17 11:27 AM  Result Value Ref Range   Glucose-Capillary 113 (H) 65 - 99 mg/dL   Comment 1 Notify RN     Blood Alcohol level:  Lab Results  Component Value Date   ETH <10 08/18/2017   ETH <10 07/08/2017    Metabolic Disorder Labs: Lab Results  Component Value Date   HGBA1C 6.2 (H) 08/19/2017   MPG 131 08/19/2017   MPG 116.89 06/10/2017   No results found for: PROLACTIN Lab Results  Component Value Date   CHOL 154 08/19/2017   TRIG 71 08/19/2017   HDL 28 (L) 08/19/2017   CHOLHDL 5.5 08/19/2017   VLDL 14 08/19/2017   LDLCALC 112 (H) 08/19/2017   LDLCALC 143 (H) 06/10/2017    Physical Findings: AIMS: Facial and Oral Movements Muscles of Facial Expression: None, normal Lips and Perioral Area: None, normal Jaw: None, normal Tongue: None, normal,Extremity Movements Upper (arms, wrists, hands, fingers): None, normal Lower (legs, knees, ankles, toes): None, normal, Trunk Movements Neck, shoulders, hips: None, normal, Overall Severity Severity of abnormal movements (highest score from questions above): None, normal Incapacitation due to abnormal movements: None, normal Patient's awareness of abnormal movements (rate only patient's report): No Awareness, Dental Status Current problems with teeth and/or dentures?: No Does patient usually wear dentures?: No  CIWA:  CIWA-Ar Total: 0 COWS:     Musculoskeletal: Strength & Muscle Tone: within normal limits Gait & Station: unsteady Patient leans: N/A  Psychiatric Specialty Exam: Physical Exam  Nursing note and vitals reviewed. Psychiatric: His speech is  normal. Judgment and thought content normal. His affect is blunt. He is slowed and actively hallucinating. Cognition and memory are normal.    Review of Systems  Neurological: Positive for weakness.  Psychiatric/Behavioral: Positive for hallucinations and substance abuse.  All other systems reviewed and are negative.   Blood pressure 124/72, pulse 65, temperature (!) 97.5 F (36.4 C), resp. rate 18, height 6\' 3"  (1.905 m), weight (!) 154.7 kg (341 lb), SpO2 94 %.Body mass index is 42.62 kg/m.  General Appearance: Fairly Groomed  Eye Contact:  Good  Speech:  Clear and Coherent  Volume:  Normal  Mood:  Anxious  Affect:  Blunt  Thought Process:  Goal Directed and Descriptions of Associations: Intact  Orientation:  Full (Time, Place, and Person)  Thought Content:  Hallucinations: Auditory  Suicidal Thoughts:  No  Homicidal Thoughts:  No  Memory:  Immediate;   Fair Recent;   Fair Remote;   Fair  Judgement:  Poor  Insight:  Lacking  Psychomotor Activity:  Decreased  Concentration:  Concentration: Fair and Attention Span: Fair  Recall:  [employer]Fair  Fund of Knowledge:  Fair  Language:  Fair  Akathisia:  No  Handed:  Right  AIMS (if indicated):     Assets:  Communication Skills Desire for Improvement Financial Resources/Insurance Housing Physical Health Resilience Social Support  ADL's:  Intact  Cognition:  WNL  Sleep:  Number  of Hours: 7.3     Treatment Plan Summary: Daily contact with patient to assess and evaluate symptoms and progress in treatment and Medication management   Treatment Plan Summary:36 yo male with history of schizophrenia and alcohol abuse admitted due to worsening AH/VH. Pt reports feeling slightly better although still has AH. He is considering group home option. We discussed this in detail with him that this would be very beneficial for him to have more support as he is at high risk of death due to excessive alcohol use and cirrhosis. So far  refuses.  Plan:  Schizophrenia -Continue Clozapine 200 mg qhs, level on 11/28 was 488 -Weekly ANC  Mood -Continue Lithium 300 mg qhs  HTN -Continue Nadolol and losartan  DM -Metformin 500 mg BID  Cirrhosis -Lactulose, Ferrous Sulfate  Seizure disorder -Keppra 1500 mg BID  Alcohol abuse -Continue CIWA with Ativan  Substance abuse treatment -patient minimizes problems and declines treatment of alcoholism  Metabolic syndrome monitoring -Lipid panel and TSH are normal, HgbA1C 6.2 -EKG, QTc 495  Dispo -Will attempt to pursue group home for pt on discharge. He is still willing to consider this    Kristine Linea, MD 08/21/2017, 2:29 PM

## 2017-08-21 NOTE — Progress Notes (Signed)
Recreation Therapy Notes  Date: 08/21/2017  Time: 9:30 am  Location: Craft Room  Behavioral response: Focused  Intervention Topic: Problem Solving  Discussion/Intervention: Group content on today was focused on problem solving. The group described what problem solving is. Patients expressed how problems affect them and how they deal with problems. Individuals identified healthy ways to deal with problems. Patients explained what normally happens to them when they do not deal with problems. The group expressed reoccurring problems for them. The group participated in the intervention "Word Scramble" with their peers and experienced dealing with problems alone versus asking for help with a problem.  Clinical Observations/Feedback:  Patient came to group and was focused on what his peers and staff had to say about problem solving. He participated in the intervention and was social with peers and staff during group.  Xena Propst LRT/CTRS         Sagan Maselli 08/21/2017 12:44 PM

## 2017-08-21 NOTE — [facility] Group Notes (Signed)
08/21/2017 1pm  Type of Therapy and Topic:  Group Therapy:  Feelings around Relapse and Recovery  Participation Level:  Active   Description of Group:    Patients in this group will discuss emotions they experience before and after a relapse. They will process how experiencing these feelings, or avoidance of experiencing them, relates to having a relapse. Facilitator will guide patients to explore emotions they have related to recovery. Patients will be encouraged to process which emotions are more powerful. They will be guided to discuss the emotional reaction significant others in their lives may have to patients' relapse or recovery. Patients will be assisted in exploring ways to respond to the emotions of others without this contributing to a relapse.  Therapeutic Goals: 1. Patient will identify two or more emotions that lead to a relapse for them 2. Patient will identify two emotions that result when they relapse 3. Patient will identify two emotions related to recovery 4. Patient will demonstrate ability to communicate their needs through discussion and/or role plays   Summary of Patient Progress: Stayed the entire time, actively engaged.  Howard [name] reports "feeling good but exhausted because of the medications".. He discussed how his anxiety leads him to his relapse with his mental health and alcohol which then leads to addictive behavior. He also discussed that when he is in his relapse it feels like a black hole where he has hallucinations and hears voices. He reports being in the action stage of change by debating if he wants to enter into a group home or not. Mood was good, pleasant and cooperative.    Therapeutic Modalities:   Cognitive Behavioral Therapy Solution-Focused Therapy Assertiveness Training Relapse Prevention Therapy   Howard ShearsCassandra  [name], LCSW 08/21/2017 1:51 PM

## 2017-08-21 NOTE — Plan of Care (Signed)
Pt safe on the unit at this time 

## 2017-08-21 NOTE — [facility] Group Notes (Signed)
[facility] Group Notes:  (Nursing/MHT/Case Management/Adjunct)  Date:  08/21/2017  Time:  9:15 PM  Type of Therapy:  Evening Wrap-up Group  Participation Level:  Active  Participation Quality:  Appropriate and Attentive  Affect:  Appropriate  Cognitive:  Alert and Appropriate  Insight:  Appropriate, Good and Improving  Engagement in Group:  Developing/Improving and Engaged  Modes of Intervention:  Activity, Socialization and Support  Summary of Progress/Problems:  Tomasita MorrowChelsea Nanta Brittan Mapel 08/21/2017, 9:15 PM

## 2017-08-22 LAB — GLUCOSE, CAPILLARY
GLUCOSE-CAPILLARY: 88 mg/dL (ref 65–99)
GLUCOSE-CAPILLARY: 113 mg/dL — AB (ref 65–99)
Glucose-Capillary: 103 mg/dL — ABNORMAL HIGH (ref 65–99)
Glucose-Capillary: 129 mg/dL — ABNORMAL HIGH (ref 65–99)

## 2017-08-22 MED ORDER — PNEUMOCOCCAL VAC POLYVALENT 25 MCG/0.5ML IJ INJ: 0.5000 mL | INJECTION | INTRAMUSCULAR | Status: DC

## 2017-08-22 NOTE — Progress Notes (Signed)
D- Patient alert and oriented. Presents very sleepy and "really tired". Patient states that he just wants to rest. Patient denies SI, HI, AVH, and pain at this time. Patient denies signs/symptoms of depression and anxiety at this time. Patient reports signs of dizziness during the morning med pass. Patient states "I'm feeling weaker" and "everything is like it's going black". Patient states that he thinks he hasn't had enough fluids. This Clinical research associatewriter informed the MD on call and encouraged the patient to inform staff when he needs fluids and if he begins to feel dizzy.     A- Scheduled medications administered to patient, per MD orders. Support and encouragement provided.  Routine safety checks conducted every 15 minutes.  Patient informed to notify staff with problems or concerns.  R- No adverse drug reactions noted. Patient contracts for safety at this time. Patient compliant with medications and treatment plan. Patient receptive, calm, and cooperative. Patient interacts well with others on the unit.  Patient remains safe at this time.

## 2017-08-22 NOTE — [facility] Group Notes (Signed)
08/22/2017 1:15pm  Type of Therapy and Topic:  Group Therapy:  Healthy Self Image and Positive Change  Participation Level:  Minimal   Description of Group:  In this group, patients will compare and contrast their current "I am...." statements to the visions they identify as desirable for their lives.  Patients discuss fears and how they can make positive changes in their cognitions that will positively impact their behaviors.  Facilitator played a motivational 3-minute speech and patients were left with the task of thinking about what "I am...." statements they can start using in their lives immediately.  Therapeutic Goals: 1. Patient will state their current self-perception as expressed in an "I Am" statement 2. Patient will contrast this with their desired vision for their live 3. Patient will identify 3 fears that negatively impact their behavior 4. Patient will discuss cognitive distortions that stem from their fears 5. Patient will verbalize statements that challenge their cognitive distortions  Summary of Patient Progress:  The patient expressed in an "I am" statement, "I am good at working on [employer] engines". Pt did not participate in group discussion after initial introduction, he stayed until the end of the group discussion.     Therapeutic Modalities Cognitive Behavioral Therapy Motivational Interviewing  Ranessa Kosta  CUEBAS-COLON, LCSW 08/22/2017 11:32 AM

## 2017-08-22 NOTE — [facility] Group Notes (Signed)
[facility] Group Notes:  (Nursing/MHT/Case Management/Adjunct)  Date:  08/22/2017  Time:  9:16 PM  Type of Therapy:  Group Therapy  Participation Level:  Active  Participation Quality:  Appropriate  Affect:  Appropriate  Cognitive:  Alert  Insight:  Appropriate  Engagement in Group:  Engaged  Modes of Intervention:  Support  Summary of Progress/Problems:  Mayra NeerJackie L Alicja Everitt 08/22/2017, 9:16 PM

## 2017-08-22 NOTE — Progress Notes (Signed)
[facility] MD Progress Note  08/22/2017 10:48 AM Howard [name]  MRN:  [id number]  Subjective:   Howard [name] reports that his mood is a little low today.  He was feeling good yesterday but he felt dizzy when he was walking around this morning and light headed.  He has been drinking water and he got back in bed.  We discuss increasing his fluid intake and getting up slowly.  He denies s/s of alcohol withdrawal.  Appetite is good, slept well.    Treatment plan. We continue Clozapine, Lithium, Neurontin, Keppra and Trazodone for psychosis, mood stabilization and seizures. Clozapine level on 11/28 was 488.  Socia/dispositio. He lives with his mother and brother where he has easy access to alcohol drinking himself to death. Refuses placement. He follows up with [name].   Principal Problem: Schizophrenia, undifferentiated (HCC) Diagnosis:   Patient Active Problem List   Diagnosis Date Noted  . 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 Schizophrenia, paranoid (HCC) [F20.0] 06/09/2017  . PTSD (post-traumatic stress disorder) [F43.10] 06/08/2017  . Inflamed external hemorrhoid [K64.4] 05/13/2017  . End stage liver disease (HCC) [K72.90] 04/24/2017  . Esophageal varices (HCC) [I85.00]   . Hematemesis [K92.0] 04/05/2017  . Upper GI bleed [K92.2]   . Iron deficiency anemia due to chronic blood loss [D50.0] 02/16/2017  . Nausea [R11.0] 01/18/2017  . Respiratory failure with hypoxia (HCC) [J96.91] 12/14/2016  . Seizures (HCC) [R56.9] 12/12/2016  . DNR (do not resuscitate) discussion [Z71.89] 08/11/2016  . Muscle weakness (generalized) [M62.81]   . GI bleed [K92.2] 08/07/2016  . OSA (obstructive sleep apnea) [G47.33] 04/29/2016  . Anemia [D64.9] 04/29/2016  . Thrombocytopenia (HCC) [D69.6] 04/29/2016  . Coagulopathy (HCC) [D68.9] 04/29/2016  . Obesity [E66.9] 04/29/2016  . Controlled type 2 diabetes mellitus without complication (HCC) [E11.9] 01/15/2016  . Essential (primary)  hypertension [I10] 12/11/2015  . Pure hypercholesterolemia [E78.00] 12/11/2015  . Hepatic encephalopathy (HCC) [K72.90] 10/16/2015  . 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 Elevated transaminase level [R74.0] 07/04/2015  . Alcohol use disorder, severe, dependence (HCC) [F10.20] 05/31/2015  . Paranoid schizophrenia (HCC) [F20.0] 03/20/2015   Total Time spent with patient: 20 minutes  Past Psychiatric History: schizophrenia, alcoholism.  Past Medical History:  Past Medical History:  Diagnosis Date  . Alcoholic cirrhosis of liver with ascites (HCC)   . Alcoholism (HCC)   . Anemia   . Atrial fibrillation (HCC)   . COPD (chronic obstructive pulmonary disease) (HCC)   . Depression   . Diabetes mellitus, type II (HCC)   . Esophageal varices (HCC)   . Heart disease    irregular heart beat (palpitations) and heart murmur  . Hyperlipemia   . Hypertension   . Liver disease   . Multiple thyroid nodules   . Portal hypertensive gastropathy (HCC)   . Schizophrenia (HCC)   . Seizures (HCC)     Past Surgical History:  Procedure Laterality Date  . ESOPHAGOGASTRODUODENOSCOPY N/A 10/05/2015   Procedure: ESOPHAGOGASTRODUODENOSCOPY (EGD);  Surgeon: Elnita Maxwell, MD;  Location: [facility] ENDOSCOPY;  Service: Endoscopy;  Laterality: N/A;  . ESOPHAGOGASTRODUODENOSCOPY N/A 06/30/2017   Procedure: ESOPHAGOGASTRODUODENOSCOPY (EGD);  Surgeon: Toney Reil, MD;  Location: [facility] ENDOSCOPY;  Service: Gastroenterology;  Laterality: N/A;  . ESOPHAGOGASTRODUODENOSCOPY (EGD) WITH PROPOFOL N/A 08/03/2015   Procedure: ESOPHAGOGASTRODUODENOSCOPY (EGD) WITH PROPOFOL;  Surgeon: Elnita Maxwell, MD;  Location: [facility] ENDOSCOPY;  Service: Endoscopy;  Laterality: N/A;  . ESOPHAGOGASTRODUODENOSCOPY (EGD) WITH PROPOFOL N/A 08/31/2015   Procedure: ESOPHAGOGASTRODUODENOSCOPY (  EGD) WITH PROPOFOL;  Surgeon: Elnita Maxwell, MD;  Location: [facility] ENDOSCOPY;  Service: Endoscopy;   Laterality: N/A;  . ESOPHAGOGASTRODUODENOSCOPY (EGD) WITH PROPOFOL N/A 04/04/2016   Procedure: ESOPHAGOGASTRODUODENOSCOPY (EGD) WITH PROPOFOL;  Surgeon: Scot Jun, MD;  Location: [facility] ENDOSCOPY;  Service: Endoscopy;  Laterality: N/A;  . ESOPHAGOGASTRODUODENOSCOPY (EGD) WITH PROPOFOL N/A 11/13/2016   Procedure: ESOPHAGOGASTRODUODENOSCOPY (EGD) WITH PROPOFOL;  Surgeon: Wyline Mood, MD;  Location: [facility] ENDOSCOPY;  Service: Endoscopy;  Laterality: N/A;  . ESOPHAGOGASTRODUODENOSCOPY (EGD) WITH PROPOFOL N/A 04/07/2017   Procedure: ESOPHAGOGASTRODUODENOSCOPY (EGD) WITH PROPOFOL;  Surgeon: Midge Minium, MD;  Location: [facility] ENDOSCOPY;  Service: Endoscopy;  Laterality: N/A;  . NO PAST SURGERIES     Family History:  Family History  Problem Relation Age of Onset  . Heart disease Mother   . Hypertension Mother   . Hyperlipidemia Mother   . Stroke Father   . Heart attack Father   . Hypertension Father   . Heart disease Father   . Alcohol abuse Father   . Heart disease Brother    Family Psychiatric  History: alcoholic father Social History:  Social History   Substance and Sexual Activity  Alcohol Use Yes  . Alcohol/week: 0.0 oz   Comment: last drink 1 days ago (noted: 05/30/2017)     Social History   Substance and Sexual Activity  Drug Use No    Social History   Socioeconomic History  . Marital status: Single    Spouse name: None  . Number of children: None  . Years of education: None  . Highest education level: None  Social Needs  . Financial resource strain: None  . Food insecurity - worry: None  . Food insecurity - inability: None  . Transportation needs - medical: None  . Transportation needs - non-medical: None  Occupational History  . Occupation: disabled  Tobacco Use  . Smoking status: Never Smoker  . Smokeless tobacco: Never Used  Substance and Sexual Activity  . Alcohol use: Yes    Alcohol/week: 0.0 oz    Comment: last drink 1 days ago (noted: 05/30/2017)  . Drug  use: No  . Sexual activity: No    Birth control/protection: None  Other Topics Concern  . None  Social History Narrative   ** Merged History Encounter **       Additional Social History:                         Sleep: Fair  Appetite:  Fair  Current Medications: Current Facility-Administered Medications  Medication Dose Route Frequency Provider Last Rate Last Dose  . acetaminophen (TYLENOL) tablet 650 mg  650 mg Oral Q6H PRN [name], [name], MD      . albuterol (PROVENTIL HFA;VENTOLIN HFA) 108 (90 Base) MCG/ACT inhaler 2 puff  2 puff Inhalation Q6H PRN [name], [name], MD      . alum & mag hydroxide-simeth (MAALOX/MYLANTA) 200-200-20 MG/5ML suspension 30 mL  30 mL Oral Q4H PRN [name], [name], MD      . cloZAPine (CLOZARIL) tablet 200 mg  200 mg Oral QHS [name], [name], MD   200 mg at 08/21/17 2126  . ferrous sulfate tablet 325 mg  325 mg Oral Q breakfast [name], [name], MD   325 mg at 08/22/17 0840  . gabapentin (NEURONTIN) capsule 900 mg  900 mg Oral TID Haskell Riling, MD   900 mg at 08/22/17 0837  . insulin aspart (novoLOG) injection 0-15  Units  0-15 Units Subcutaneous TID WC [name], Jackquline DenmarkJohn T, MD   Stopped at 08/22/17 [phone]  . lactulose (CHRONULAC) 10 GM/15ML solution 30 g  30 g Oral TID [name], Jackquline DenmarkJohn T, MD   30 g at 08/22/17 0840  . levETIRAcetam (KEPPRA) tablet 1,500 mg  1,500 mg Oral BID [name], Jackquline DenmarkJohn T, MD   1,500 mg at 08/22/17 [phone]  . lithium carbonate capsule 300 mg  300 mg Oral QHS [name], Jackquline DenmarkJohn T, MD   300 mg at 08/21/17 2126  . loperamide (IMODIUM) capsule 2-4 mg  2-4 mg Oral PRN McNew, Ileene HutchinsonHolly R, MD      . LORazepam (ATIVAN) tablet 1 mg  1 mg Oral Q2H PRN McNew, Ileene HutchinsonHolly R, MD      . losartan (COZAAR) tablet 25 mg  25 mg Oral Daily [name], Jackquline DenmarkJohn T, MD   25 mg at 08/22/17 [phone]  . magnesium hydroxide (MILK OF MAGNESIA) suspension 30 mL  30 mL Oral Daily PRN [name], [name], MD      . metFORMIN (GLUCOPHAGE) tablet 500 mg  500 mg Oral BID WC [name], Jackquline DenmarkJohn T, MD   500  mg at 08/22/17 [phone]  . multivitamin with minerals tablet 1 tablet  1 tablet Oral Daily McNew, Ileene HutchinsonHolly R, MD   1 tablet at 08/22/17 0840  . nadolol (CORGARD) tablet 20 mg  20 mg Oral Daily [name], Jackquline DenmarkJohn T, MD   20 mg at 08/22/17 [id number]  . ondansetron (ZOFRAN-ODT) disintegrating tablet 4 mg  4 mg Oral Q6H PRN McNew, Ileene HutchinsonHolly R, MD      . pantoprazole (PROTONIX) EC tablet 40 mg  40 mg Oral Daily [name], Jackquline DenmarkJohn T, MD   40 mg at 08/22/17 0839  . rifaximin (XIFAXAN) tablet 550 mg  550 mg Oral BID [name], Jackquline DenmarkJohn T, MD   550 mg at 08/22/17 0839  . sucralfate (CARAFATE) tablet 1 g  1 g Oral TID WC & HS [name], Jackquline DenmarkJohn T, MD   1 g at 08/22/17 0839  . thiamine (VITAMIN B-1) tablet 100 mg  100 mg Oral Daily [name], Jackquline DenmarkJohn T, MD   100 mg at 08/19/17 0839  . thiamine (VITAMIN B-1) tablet 100 mg  100 mg Oral Daily McNew, Ileene HutchinsonHolly R, MD   100 mg at 08/22/17 0839  . traZODone (DESYREL) tablet 100 mg  100 mg Oral QHS [name], [name], MD   100 mg at 08/21/17 2126    Lab Results:  Results for orders placed or performed during the hospital encounter of 08/19/17 (from the past 48 hour(s))  Glucose, capillary     Status: None   Collection Time: 08/20/17 11:25 AM  Result Value Ref Range   Glucose-Capillary 99 65 - 99 mg/dL  Glucose, capillary     Status: None   Collection Time: 08/20/17  4:25 PM  Result Value Ref Range   Glucose-Capillary 79 65 - 99 mg/dL   Comment 1 Notify RN   Glucose, capillary     Status: Abnormal   Collection Time: 08/20/17  9:25 PM  Result Value Ref Range   Glucose-Capillary 125 (H) 65 - 99 mg/dL  Glucose, capillary     Status: Abnormal   Collection Time: 08/21/17  7:02 AM  Result Value Ref Range   Glucose-Capillary 101 (H) 65 - 99 mg/dL  Glucose, capillary     Status: Abnormal   Collection Time: 08/21/17 11:27 AM  Result Value Ref Range   Glucose-Capillary 113 (H) 65 - 99 mg/dL   Comment 1 Notify  RN   Glucose, capillary     Status: None   Collection Time: 08/21/17  4:24 PM  Result Value Ref  Range   Glucose-Capillary 81 65 - 99 mg/dL   Comment 1 Notify RN    Comment 2 Document in Chart   Glucose, capillary     Status: None   Collection Time: 08/21/17  8:04 PM  Result Value Ref Range   Glucose-Capillary 94 65 - 99 mg/dL   Comment 1 Notify RN   Glucose, capillary     Status: None   Collection Time: 08/22/17  6:59 AM  Result Value Ref Range   Glucose-Capillary 88 65 - 99 mg/dL   Comment 1 Notify RN     Blood Alcohol level:  Lab Results  Component Value Date   ETH <10 08/18/2017   ETH <10 07/08/2017    Metabolic Disorder Labs: Lab Results  Component Value Date   HGBA1C 6.2 (H) 08/19/2017   MPG 131 08/19/2017   MPG 116.89 06/10/2017   No results found for: PROLACTIN Lab Results  Component Value Date   CHOL 154 08/19/2017   TRIG 71 08/19/2017   HDL 28 (L) 08/19/2017   CHOLHDL 5.5 08/19/2017   VLDL 14 08/19/2017   LDLCALC 112 (H) 08/19/2017   LDLCALC 143 (H) 06/10/2017    Physical Findings: AIMS: Facial and Oral Movements Muscles of Facial Expression: None, normal Lips and Perioral Area: None, normal Jaw: None, normal Tongue: None, normal,Extremity Movements Upper (arms, wrists, hands, fingers): None, normal Lower (legs, knees, ankles, toes): None, normal, Trunk Movements Neck, shoulders, hips: None, normal, Overall Severity Severity of abnormal movements (highest score from questions above): None, normal Incapacitation due to abnormal movements: None, normal Patient's awareness of abnormal movements (rate only patient's report): No Awareness, Dental Status Current problems with teeth and/or dentures?: No Does patient usually wear dentures?: No  CIWA:  CIWA-Ar Total: 0 COWS:     Musculoskeletal: Strength & Muscle Tone: within normal limits Gait & Station: unsteady Patient leans: N/A  Psychiatric Specialty Exam: Physical Exam  Nursing note and vitals reviewed. Psychiatric: His speech is normal. Judgment and thought content normal. His affect is  blunt. He is slowed and actively hallucinating. Cognition and memory are normal.    Review of Systems  Neurological: Positive for weakness.  Psychiatric/Behavioral: Positive for hallucinations and substance abuse.  All other systems reviewed and are negative.   Blood pressure 125/61, pulse 90, temperature 98 F (36.7 C), temperature source Oral, resp. rate 18, height 6\' 3"  (1.905 m), weight (!) 154.7 kg (341 lb), SpO2 94 %.Body mass index is 42.62 kg/m.  General Appearance: Fairly Groomed  Eye Contact:  Good  Speech:  Clear and Coherent  Volume:  Normal  Mood:  Anxious "not as well"  Affect:  Blunt  Thought Process:  Goal Directed and Descriptions of Associations: Intact  Orientation:  Full (Time, Place, and Person)  Thought Content:  Hallucinations: Auditory  Suicidal Thoughts:  No  Homicidal Thoughts:  No  Memory:  Immediate;   Fair Recent;   Fair Remote;   Fair  Judgement:  Poor  Insight:  Lacking  Psychomotor Activity:  Decreased  Concentration:  Concentration: Fair and Attention Span: Fair  Recall:  [employer] of Knowledge:  Fair  Language:  Fair  Akathisia:  No  Handed:  Right  AIMS (if indicated):     Assets:  Communication Skills Desire for Improvement Financial Resources/Insurance Housing Physical Health Resilience Social Support  ADL's:  Intact  Cognition:  WNL  Sleep:  Number of Hours: 7.45     Treatment Plan Summary: Daily contact with patient to assess and evaluate symptoms and progress in treatment and Medication management   Treatment Plan Summary:36 yo male with history of schizophrenia and alcohol abuse admitted due to worsening AH/VH. Pt reports feeling slightly better although still has AH. He is considering group home option. We discussed this in detail with him that this would be very beneficial for him to have more support as he is at high risk of death due to excessive alcohol use and cirrhosis. So far refuses.  Plan:  Dizziness: Increase  fluid uptake   Schizophrenia -Continue Clozapine 200 mg qhs, level on 11/28 was 488 -Weekly ANC  Mood -Continue Lithium 300 mg qhs  HTN -Continue Nadolol and losartan  DM -Metformin 500 mg BID  Cirrhosis -Lactulose, Ferrous Sulfate  Seizure disorder -Keppra 1500 mg BID  Alcohol abuse -Continue CIWA with Ativan  Substance abuse treatment -patient minimizes problems and declines treatment of alcoholism  Metabolic syndrome monitoring -Lipid panel and TSH are normal, HgbA1C 6.2 -EKG, QTc 495  Dispo -Will attempt to pursue group home for pt on discharge. He is still willing to consider this    Cindee LameLauren M Trevione Wert, MD 08/22/2017, 10:48 AM

## 2017-08-22 NOTE — Plan of Care (Signed)
Patient has been functioning at an adequate level. Patient understands his condition and what needs to be done to keep his clinical measurements within the normal range, which they have been during this visit.  Patient has been free of complications and his sleep pattern has improved throughout his visit. Patient has maintained adequate nutrition. Patient doesn't express any signs/symptoms of  depression or anxiety. Patient has been free from injury and remains safe on the unit.

## 2017-08-22 NOTE — Progress Notes (Addendum)
Pleasant on contact, up to the dayroom for group. Clo being tired and feeling weak. Denies all psych symptoms since he is back on his meds. States he would be willing to go to treatment for his alcoholism and attend AA. Ate a snack, CS was 127. In bed at this time. Was med compliant and remains on routine obs for safety.

## 2017-08-22 NOTE — Progress Notes (Signed)
The order for PPSV23 has been discontinued. There is a documented administration on 04/29/2016 and patient is not eligible for repeat dose at this time.  Shamecca Whitebread A. Renvilleookson, VermontPharm.D., BCPS Clinical Pharmacist 08/22/2017 21:48

## 2017-08-23 LAB — GLUCOSE, CAPILLARY
GLUCOSE-CAPILLARY: 106 mg/dL — AB (ref 65–99)
Glucose-Capillary: 114 mg/dL — ABNORMAL HIGH (ref 65–99)
Glucose-Capillary: 128 mg/dL — ABNORMAL HIGH (ref 65–99)
Glucose-Capillary: 113 mg/dL — ABNORMAL HIGH (ref 65–99)

## 2017-08-23 NOTE — Progress Notes (Signed)
[facility] Progress Note  08/23/2017 1:58 PM Howard [name]  MRN:  [id number]  Subjective:   Howard [name] reports that he feels better today, he is no longer dizzy.  He denies depression but feels some mild anxiety. He talks about going home, he plans to join AA and hang around positive people so he can stay sober. No si/hi.  He also states he does not want to live in a group home on discharge, he wants to live with his brother.   Treatment plan. We continue Clozapine, Lithium, Neurontin, Keppra and Trazodone for psychosis, mood stabilization and seizures. Clozapine level on 11/28 was 488.  Socia/dispositio. He lives with his mother and brother where he has easy access to alcohol drinking himself to death. Refuses placement. He follows up with Dr. Toni Amendlapacs.   Principal Problem: Schizophrenia, undifferentiated (HCC) Diagnosis:   Patient Active Problem List   Diagnosis Date Noted  . 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 Schizophrenia, paranoid (HCC) [F20.0] 06/09/2017  . PTSD (post-traumatic stress disorder) [F43.10] 06/08/2017  . Inflamed external hemorrhoid [K64.4] 05/13/2017  . End stage liver disease (HCC) [K72.90] 04/24/2017  . Esophageal varices (HCC) [I85.00]   . Hematemesis [K92.0] 04/05/2017  . Upper GI bleed [K92.2]   . Iron deficiency anemia due to chronic blood loss [D50.0] 02/16/2017  . Nausea [R11.0] 01/18/2017  . Respiratory failure with hypoxia (HCC) [J96.91] 12/14/2016  . Seizures (HCC) [R56.9] 12/12/2016  . DNR (do not resuscitate) discussion [Z71.89] 08/11/2016  . Muscle weakness (generalized) [M62.81]   . GI bleed [K92.2] 08/07/2016  . OSA (obstructive sleep apnea) [G47.33] 04/29/2016  . Anemia [D64.9] 04/29/2016  . Thrombocytopenia (HCC) [D69.6] 04/29/2016  . Coagulopathy (HCC) [D68.9] 04/29/2016  . Obesity [E66.9] 04/29/2016  . Controlled type 2 diabetes mellitus without complication (HCC) [E11.9] 01/15/2016  . Essential (primary)  hypertension [I10] 12/11/2015  . Pure hypercholesterolemia [E78.00] 12/11/2015  . Hepatic encephalopathy (HCC) [K72.90] 10/16/2015  . 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 Elevated transaminase level [R74.0] 07/04/2015  . Alcohol use disorder, severe, dependence (HCC) [F10.20] 05/31/2015  . Paranoid schizophrenia (HCC) [F20.0] 03/20/2015   Total Time spent with patient: 20 minutes  Past Psychiatric History: schizophrenia, alcoholism.  Past Medical History:  Past Medical History:  Diagnosis Date  . Alcoholic cirrhosis of liver with ascites (HCC)   . Alcoholism (HCC)   . Anemia   . Atrial fibrillation (HCC)   . COPD (chronic obstructive pulmonary disease) (HCC)   . Depression   . Diabetes mellitus, type II (HCC)   . Esophageal varices (HCC)   . Heart disease    irregular heart beat (palpitations) and heart murmur  . Hyperlipemia   . Hypertension   . Liver disease   . Multiple thyroid nodules   . Portal hypertensive gastropathy (HCC)   . Schizophrenia (HCC)   . Seizures (HCC)     Past Surgical History:  Procedure Laterality Date  . ESOPHAGOGASTRODUODENOSCOPY N/A 10/05/2015   Procedure: ESOPHAGOGASTRODUODENOSCOPY (EGD);  Surgeon: Elnita MaxwellMatthew Gordon Rein, MD;  [facility];  Service: Endoscopy;  Laterality: N/A;  . ESOPHAGOGASTRODUODENOSCOPY N/A 06/30/2017   Procedure: ESOPHAGOGASTRODUODENOSCOPY (EGD);  Surgeon: Toney ReilVanga, Rohini Reddy, MD;  [facility];  Service: Gastroenterology;  Laterality: N/A;  . ESOPHAGOGASTRODUODENOSCOPY (EGD) WITH PROPOFOL N/A 08/03/2015   Procedure: ESOPHAGOGASTRODUODENOSCOPY (EGD) WITH PROPOFOL;  Surgeon: Elnita MaxwellMatthew Gordon Rein, MD;  [facility];  Service: Endoscopy;  Laterality: N/A;  . ESOPHAGOGASTRODUODENOSCOPY (EGD) WITH PROPOFOL N/A 08/31/2015   Procedure: ESOPHAGOGASTRODUODENOSCOPY (EGD)  WITH PROPOFOL;  Surgeon: Elnita MaxwellMatthew Gordon Rein, MD;  [facility];  Service: Endoscopy;   Laterality: N/A;  . ESOPHAGOGASTRODUODENOSCOPY (EGD) WITH PROPOFOL N/A 04/04/2016   Procedure: ESOPHAGOGASTRODUODENOSCOPY (EGD) WITH PROPOFOL;  Surgeon: Scot Junobert T Elliott, MD;  [facility];  Service: Endoscopy;  Laterality: N/A;  . ESOPHAGOGASTRODUODENOSCOPY (EGD) WITH PROPOFOL N/A 11/13/2016   Procedure: ESOPHAGOGASTRODUODENOSCOPY (EGD) WITH PROPOFOL;  Surgeon: Wyline MoodKiran Anna, MD;  Location: [facility] ENDOSCOPY;  Service: Endoscopy;  Laterality: N/A;  . ESOPHAGOGASTRODUODENOSCOPY (EGD) WITH PROPOFOL N/A 04/07/2017   Procedure: ESOPHAGOGASTRODUODENOSCOPY (EGD) WITH PROPOFOL;  Surgeon: Midge MiniumWohl, Darren, MD;  Location: [facility] ENDOSCOPY;  Service: Endoscopy;  Laterality: N/A;  . NO PAST SURGERIES     Family History:  Family History  Problem Relation Age of Onset  . Heart disease Mother   . Hypertension Mother   . Hyperlipidemia Mother   . Stroke Father   . Heart attack Father   . Hypertension Father   . Heart disease Father   . Alcohol abuse Father   . Heart disease Brother    Family Psychiatric  History: alcoholic father Social History:  Social History   Substance and Sexual Activity  Alcohol Use Yes  . Alcohol/week: 0.0 oz   Comment: last drink 1 days ago (noted: 05/30/2017)     Social History   Substance and Sexual Activity  Drug Use No    Social History   Socioeconomic History  . Marital status: Single    Spouse name: None  . Number of children: None  . Years of education: None  . Highest education level: None  Social Needs  . Financial resource strain: None  . Food insecurity - worry: None  . Food insecurity - inability: None  . Transportation needs - medical: None  . Transportation needs - non-medical: None  Occupational History  . Occupation: disabled  Tobacco Use  . Smoking status: Never Smoker  . Smokeless tobacco: Never Used  Substance and Sexual Activity  . Alcohol use: Yes    Alcohol/week: 0.0 oz    Comment: last drink 1 days ago (noted: 05/30/2017)  . Drug  use: No  . Sexual activity: No    Birth control/protection: None  Other Topics Concern  . None  Social History Narrative   ** Merged History Encounter **       Additional Social History:                         Sleep: Fair  Appetite:  Fair  Current Medications: Current Facility-Administered Medications  Medication Dose Route Frequency Provider Last Rate Last Dose  . acetaminophen (TYLENOL) tablet 650 mg  650 mg Oral Q6H PRN [name], [name], MD      . albuterol (PROVENTIL HFA;VENTOLIN HFA) 108 (90 Base) MCG/ACT inhaler 2 puff  2 puff Inhalation Q6H PRN [name], [name], MD      . alum & mag hydroxide-simeth (MAALOX/MYLANTA) 200-200-20 MG/5ML suspension 30 mL  30 mL Oral Q4H PRN [name], [name], MD      . cloZAPine (CLOZARIL) tablet 200 mg  200 mg Oral QHS [name], [name], MD   200 mg at 08/22/17 2132  . ferrous sulfate tablet 325 mg  325 mg Oral Q breakfast [name], Jackquline DenmarkJohn T, MD   325 mg at 08/23/17 0850  . gabapentin (NEURONTIN) capsule 900 mg  900 mg Oral TID Haskell RilingMcNew, Holly R, MD   900 mg at 08/23/17 1237  . insulin aspart (novoLOG) injection 0-15 Units  0-15 Units Subcutaneous TID WC [name], [name], MD   2 Units at 08/23/17 1237  . lactulose (CHRONULAC) 10 GM/15ML solution 30 g  30 g Oral TID [name], [name], MD   30 g at 08/23/17 1236  . levETIRAcetam (KEPPRA) tablet 1,500 mg  1,500 mg Oral BID [name], [name], MD   1,500 mg at 08/23/17 0850  . lithium carbonate capsule 300 mg  300 mg Oral QHS [name], [name], MD   300 mg at 08/22/17 2132  . losartan (COZAAR) tablet 25 mg  25 mg Oral Daily [name], [name], MD   25 mg at 08/23/17 0850  . magnesium hydroxide (MILK OF MAGNESIA) suspension 30 mL  30 mL Oral Daily PRN [name], [name], MD      . metFORMIN (GLUCOPHAGE) tablet 500 mg  500 mg Oral BID WC [name], [name], MD   500 mg at 08/23/17 0851  . multivitamin with minerals tablet 1 tablet  1 tablet Oral Daily McNew, Ileene Hutchinson, MD   1 tablet at 08/23/17 0850  . nadolol (CORGARD)  tablet 20 mg  20 mg Oral Daily [name], [name], MD   20 mg at 08/23/17 0850  . pantoprazole (PROTONIX) EC tablet 40 mg  40 mg Oral Daily [name], [name], MD   40 mg at 08/23/17 0850  . rifaximin (XIFAXAN) tablet 550 mg  550 mg Oral BID [name], [name], MD   550 mg at 08/23/17 0850  . sucralfate (CARAFATE) tablet 1 g  1 g Oral TID WC & HS [name], [name], MD   1 g at 08/23/17 1237  . thiamine (VITAMIN B-1) tablet 100 mg  100 mg Oral Daily [name], [name], MD   100 mg at 08/19/17 0839  . thiamine (VITAMIN B-1) tablet 100 mg  100 mg Oral Daily McNew, Ileene Hutchinson, MD   100 mg at 08/23/17 0850  . traZODone (DESYREL) tablet 100 mg  100 mg Oral QHS [name], [name], MD   100 mg at 08/22/17 2132    Lab Results:  Results for orders placed or performed during the hospital encounter of 08/19/17 (from the past 48 hour(s))  Glucose, capillary     Status: None   Collection Time: 08/21/17  4:24 PM  Result Value Ref Range   Glucose-Capillary 81 65 - 99 mg/dL   Comment 1 Notify RN    Comment 2 Document in Chart   Glucose, capillary     Status: None   Collection Time: 08/21/17  8:04 PM  Result Value Ref Range   Glucose-Capillary 94 65 - 99 mg/dL   Comment 1 Notify RN   Glucose, capillary     Status: None   Collection Time: 08/22/17  6:59 AM  Result Value Ref Range   Glucose-Capillary 88 65 - 99 mg/dL   Comment 1 Notify RN   Glucose, capillary     Status: Abnormal   Collection Time: 08/22/17 11:25 AM  Result Value Ref Range   Glucose-Capillary 113 (H) 65 - 99 mg/dL   Comment 1 Notify RN    Comment 2 Document in Chart   Glucose, capillary     Status: Abnormal   Collection Time: 08/22/17  4:15 PM  Result Value Ref Range   Glucose-Capillary 103 (H) 65 - 99 mg/dL   Comment 1 Notify RN   Glucose, capillary     Status: Abnormal   Collection Time: 08/22/17  9:31 PM  Result Value Ref Range   Glucose-Capillary  129 (H) 65 - 99 mg/dL  Glucose, capillary     Status: Abnormal   Collection Time: 08/23/17  7:05  AM  Result Value Ref Range   Glucose-Capillary 114 (H) 65 - 99 mg/dL   Comment 1 Notify RN   Glucose, capillary     Status: Abnormal   Collection Time: 08/23/17 11:25 AM  Result Value Ref Range   Glucose-Capillary 128 (H) 65 - 99 mg/dL    Blood Alcohol level:  Lab Results  Component Value Date   ETH <10 08/18/2017   ETH <10 07/08/2017    Metabolic Disorder Labs: Lab Results  Component Value Date   HGBA1C 6.2 (H) 08/19/2017   MPG 131 08/19/2017   MPG 116.89 06/10/2017   No results found for: PROLACTIN Lab Results  Component Value Date   CHOL 154 08/19/2017   TRIG 71 08/19/2017   HDL 28 (L) 08/19/2017   CHOLHDL 5.5 08/19/2017   VLDL 14 08/19/2017   LDLCALC 112 (H) 08/19/2017   LDLCALC 143 (H) 06/10/2017    Physical Findings: AIMS: Facial and Oral Movements Muscles of Facial Expression: None, normal Lips and Perioral Area: None, normal Jaw: None, normal Tongue: None, normal,Extremity Movements Upper (arms, wrists, hands, fingers): None, normal Lower (legs, knees, ankles, toes): None, normal, Trunk Movements Neck, shoulders, hips: None, normal, Overall Severity Severity of abnormal movements (highest score from questions above): None, normal Incapacitation due to abnormal movements: None, normal Patient's awareness of abnormal movements (rate only patient's report): No Awareness, Dental Status Current problems with teeth and/or dentures?: No Does patient usually wear dentures?: No  CIWA:  CIWA-Ar Total: 0 COWS:     Musculoskeletal: Strength & Muscle Tone: within normal limits Gait & Station: unsteady Patient leans: N/A  Psychiatric Specialty Exam: Physical Exam  Nursing note and vitals reviewed. Psychiatric: His speech is normal. Judgment and thought content normal. His affect is blunt. He is slowed and actively hallucinating. Cognition and memory are normal.    Review of Systems  Neurological: Positive for weakness.  Psychiatric/Behavioral: Positive for  hallucinations and substance abuse.  All other systems reviewed and are negative.   Blood pressure 132/73, pulse 90, temperature 98.1 F (36.7 C), temperature source Oral, resp. rate 18, height 6\' 3"  (1.905 m), weight (!) 154.7 kg (341 lb), SpO2 94 %.Body mass index is 42.62 kg/m.  General Appearance: Fairly Groomed  Eye Contact:  Good  Speech:  Clear and Coherent  Volume:  Normal  Mood:  "better"   Affect:  Blunt  Thought Process:  Goal Directed and Descriptions of Associations: Intact  Orientation:  Full (Time, Place, and Person)  Thought Content:  Hallucinations: Auditory  Suicidal Thoughts:  No  Homicidal Thoughts:  No  Memory:  Immediate;   Fair Recent;   Fair Remote;   Fair  Judgement:  Poor  Insight:  Lacking  Psychomotor Activity:  Decreased  Concentration:  Concentration: Fair and Attention Span: Fair  Recall:  [employer] of Knowledge:  Fair  Language:  Fair  Akathisia:  No  Handed:  Right  AIMS (if indicated):     Assets:  Communication Skills Desire for Improvement Financial Resources/Insurance Housing Physical Health Resilience Social Support  ADL's:  Intact  Cognition:  WNL  Sleep:  Number of Hours: 6.3     Treatment Plan Summary: Daily contact with patient to assess and evaluate symptoms and progress in treatment and Medication management   Treatment Plan Summary:36 yo male with history of schizophrenia and alcohol abuse admitted  due to worsening AH/VH. He is considering group home option. We discussed this in detail with him that this would be very beneficial for him to have more support as he is at high risk of death due to excessive alcohol use and cirrhosis. So far refuses.  Plan:  Dizziness: Resolved  Schizophrenia -Continue Clozapine 200 mg qhs, level on 11/28 was 488 -Weekly ANC  Mood -Continue Lithium 300 mg qhs  HTN -Continue Nadolol and losartan  DM -Metformin 500 mg BID  Cirrhosis -Lactulose, Ferrous Sulfate  Seizure  disorder -Keppra 1500 mg BID  Alcohol abuse -Continue CIWA with Ativan  Substance abuse treatment -patient states he wants to go to AA   Metabolic syndrome monitoring -Lipid panel and TSH are normal, HgbA1C 6.2 -EKG, QTc 495  Dispo -Will attempt to pursue group home for pt on discharge.     Cindee Lame, MD 08/23/2017, 1:58 PM

## 2017-08-23 NOTE — Progress Notes (Signed)
D- Patient alert and oriented. Patient presents slightly agitated because he had to wait for his medications. Patient reports that he feels that the medication is beginning to work for him. Patient states that his anxiety level is a "1/10". Patient denies SI, HI, AVH, and pain at this time. Patient doesn't have a stated goal for today.  A- Scheduled medications administered to patient, per MD orders. Support and encouragement provided.  Routine safety checks conducted every 15 minutes.  Patient informed to notify staff with problems or concerns.  R- No adverse drug reactions noted. Patient contracts for safety at this time. Patient compliant with medications and treatment plan. Patient receptive, calm, and cooperative. Patient interacts well with others on the unit.  Patient remains safe at this time.

## 2017-08-23 NOTE — Progress Notes (Signed)
Was visible in the milieu, social with peers. Denies any AVH, SI or HI. Still upset by talk of going to a group home since he and his brother and mother have their own home. Encouraged to think about it and pointed out that he continues to drink when at home. Pt was med compliant, remains on routine obs for safety. HS CS was 113

## 2017-08-23 NOTE — Plan of Care (Signed)
Patient is functioning at an adequate level and understands his condition. Patient verbalizes understanding of lifestyle changes that need to made in order for him to improve and what needs to be done in order to discharge. Patient has not experienced any complications related to disease process. Patient has remained free from injury on the unit. Patient reports that he slept fine last night and that he has no signs/symptoms of depression today and rates his anxiety level a "1/10" today.

## 2017-08-23 NOTE — [facility] Group Notes (Signed)
08/23/2017 1:15pm  Type of Therapy and Topic: Group Therapy: Feelings Around Returning Home & Establishing a Supportive Framework and Supporting Oneself When Supports Not Available  Participation Level: Minimal  Description of Group:  Patients first processed thoughts and feelings about upcoming discharge. These included fears of upcoming changes, lack of change, new living environments, judgements and expectations from others and overall stigma of mental health issues. The group then discussed the definition of a supportive framework, what that looks and feels like, and how do to discern it from an unhealthy non-supportive network. The group identified different types of supports as well as what to do when your family/friends are less than helpful or unavailable  Therapeutic Goals  1. Patient will identify one healthy supportive network that they can use at discharge. 2. Patient will identify one factor of a supportive framework and how to tell it from an unhealthy network. 3. Patient able to identify one coping skill to use when they do not have positive supports from others. 4. Patient will demonstrate ability to communicate their needs through discussion and/or role plays.  Summary of Patient Progress: Pt shared his concerns about being discharge at the end of the week. He reports that he is angry about going to a group home instead of his home.   Therapeutic Modalities Cognitive Behavioral Therapy Motivational Interviewing   Ania Levay  CUEBAS-COLON, LCSW 08/23/2017 12:07 PM

## 2017-08-24 LAB — GLUCOSE, CAPILLARY
GLUCOSE-CAPILLARY: 105 mg/dL — AB (ref 65–99)
Glucose-Capillary: 86 mg/dL (ref 65–99)

## 2017-08-24 MED ORDER — GABAPENTIN 300 MG PO CAPS: 900.0000 mg | ORAL_CAPSULE | Freq: Three times a day (TID) | ORAL | 1 refills | Status: AC
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
## 2017-08-24 NOTE — [facility] Suicide Risk Assessment (Signed)
[facility]   Principal Problem: Schizophrenia, undifferentiated (HCC) Discharge Diagnoses:  Patient Active Problem List   Diagnosis Date Noted  . 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 Schizophrenia, paranoid (HCC) [F20.0] 06/09/2017  . PTSD (post-traumatic stress disorder) [F43.10] 06/08/2017  . Inflamed external hemorrhoid [K64.4] 05/13/2017  . End stage liver disease (HCC) [K72.90] 04/24/2017  . Esophageal varices (HCC) [I85.00]   . Hematemesis [K92.0] 04/05/2017  . Upper GI bleed [K92.2]   . Iron deficiency anemia due to chronic blood loss [D50.0] 02/16/2017  . Nausea [R11.0] 01/18/2017  . Respiratory failure with hypoxia (HCC) [J96.91] 12/14/2016  . Seizures (HCC) [R56.9] 12/12/2016  . DNR (do not resuscitate) discussion [Z71.89] 08/11/2016  . Muscle weakness (generalized) [M62.81]   . GI bleed [K92.2] 08/07/2016  . OSA (obstructive sleep apnea) [G47.33] 04/29/2016  . Anemia [D64.9] 04/29/2016  . Thrombocytopenia (HCC) [D69.6] 04/29/2016  . Coagulopathy (HCC) [D68.9] 04/29/2016  . Obesity [E66.9] 04/29/2016  . Controlled type 2 diabetes mellitus without complication (HCC) [E11.9] 01/15/2016  . Essential (primary) hypertension [I10] 12/11/2015  . Pure hypercholesterolemia [E78.00] 12/11/2015  . Hepatic encephalopathy (HCC) [K72.90] 10/16/2015  . 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 Elevated transaminase level [R74.0] 07/04/2015  . Alcohol use disorder, severe, dependence (HCC) [F10.20] 05/31/2015  . Paranoid schizophrenia (HCC) [F20.0] 03/20/2015    Total Time spent with patient: 30 minutes  Musculoskeletal: Strength & Muscle Tone: within normal limits Gait & Station: normal Patient leans: N/A  Psychiatric Specialty Exam: Review of Systems  Neurological: Negative.   Psychiatric/Behavioral: Positive for substance abuse.  All other systems reviewed and are negative.   Blood pressure  (!) 145/78, pulse 82, temperature 98.2 F (36.8 C), temperature source Oral, resp. rate 18, height 6\' 3"  (1.905 m), weight (!) 154.7 kg (341 lb), SpO2 94 %.Body mass index is 42.62 kg/m.  General Appearance: Casual  Eye Contact::  Good  Speech:  Clear and Coherent409  Volume:  Normal  Mood:  Euthymic  Affect:  Appropriate  Thought Process:  Goal Directed and Descriptions of Associations: Intact  Orientation:  Full (Time, Place, and Person)  Thought Content:  WDL  Suicidal Thoughts:  No  Homicidal Thoughts:  No  Memory:  Immediate;   Fair Recent;   Fair Remote;   Fair  Judgement:  Poor  Insight:  Lacking  Psychomotor Activity:  Normal  Concentration:  Fair  Recall:  [employer]  Fund of Knowledge:Fair  Language: Fair  Akathisia:  No  Handed:  Right  AIMS (if indicated):     Assets:  Communication Skills Desire for Improvement Financial Resources/Insurance Housing Resilience Social Support  Sleep:  Number of Hours: 7.15  Cognition: WNL  ADL's:  Intact   Mental Status Per Nursing Assessment::   On Admission:  Self-harm thoughts  Demographic Factors:  Male, Caucasian and Low socioeconomic status  Loss Factors: Decline in physical health  Historical Factors: Prior suicide attempts, Family history of mental illness or substance abuse and Impulsivity  Risk Reduction Factors:   Responsible for children under 38 years of age, Living with another person, especially a relative, Positive social support and Positive therapeutic relationship  Continued Clinical Symptoms:  Alcohol/Substance Abuse/Dependencies Schizophrenia:   Less than 38 years old Paranoid or undifferentiated type  Cognitive Features That Contribute To Risk:  None    Suicide Risk:  Minimal: No identifiable suicidal ideation.  Patients presenting with no risk factors but with morbid ruminations; may be classified as minimal risk  based on the severity of the depressive symptoms  Follow-up Information     [facility]. Go on 08/[facility]/2017.   Why:  1:20pm for Hospital Follow up.  [phone] Contact information: [address] [name] [id number]          Plan Of Care/Follow-up recommendations:  Activity:  as tolerated Diet:  low sodium heart healthy Other:  keep follow up appointments  Kristine LineaJolanta Von Inscoe, MD 08/24/2017, 9:53 AM

## 2017-08-24 NOTE — Progress Notes (Signed)
  [facility] :  Will you be returning to the same living situation after discharge:  Yes,    At discharge, do you have transportation home?: Yes,    Do you have the ability to pay for your medications: Yes,     Release of information consent forms completed and in the chart;  Patient's signature needed at discharge.  Patient to Follow up at: Follow-up Information    [facility]. Go on 08/27/2017.   Why:  1:20pm for Hospital Follow up.  [phone]          Next level of care provider has [facility]:yes  Safety Planning and Suicide Prevention discussed: Yes,     Have you used any form of tobacco in the last 30 days? (Cigarettes, Smokeless Tobacco, Cigars, and/or Pipes): No  Has patient been referred to the Quitline?: N/A patient is not a smoker  Patient has been referred for addiction treatment: Yes  Glennon MacSara P Maxten Shuler, LCSW 08/24/2017, 2:54 PM

## 2017-08-24 NOTE — Tx Team (Signed)
Interdisciplinary Treatment and Diagnostic Plan Update  08/24/2017 Time of Session: 10:30 AM Renaud Ulices Maack MRN: [id number]  Principal Diagnosis: Schizophrenia, undifferentiated ([place])  Secondary Diagnoses: Principal Problem:   Schizophrenia, undifferentiated ([place]) Active Problems:   Alcohol use disorder, severe, dependence ([place])   Controlled type 2 diabetes mellitus without complication ([place])   Essential (primary) hypertension   Obesity   Seizures ([place])   Current Medications:  Current Facility-Administered Medications  Medication Dose Route Frequency Provider Last Rate Last Dose  . acetaminophen (TYLENOL) tablet 650 mg  650 mg Oral Q6H PRN [name], [name], MD      . albuterol (PROVENTIL HFA;VENTOLIN HFA) 108 (90 Base) MCG/ACT inhaler 2 puff  2 puff Inhalation Q6H PRN [name], [name], MD      . alum & mag hydroxide-simeth (MAALOX/MYLANTA) 200-200-20 MG/5ML suspension 30 mL  30 mL Oral Q4H PRN [name], [name], MD      . cloZAPine (CLOZARIL) tablet 200 mg  200 mg Oral QHS [name], [name], MD   200 mg at 08/23/17 2148  . ferrous sulfate tablet 325 mg  325 mg Oral Q breakfast [name], Madie Reno, MD   325 mg at 08/24/17 0759  . gabapentin (NEURONTIN) capsule 900 mg  900 mg Oral TID Marylin Crosby, MD   900 mg at 08/24/17 1138  . insulin aspart (novoLOG) injection 0-15 Units  0-15 Units Subcutaneous TID WC [name], Madie Reno, MD   Stopped at 08/23/17 1640  . lactulose (CHRONULAC) 10 GM/15ML solution 30 g  30 g Oral TID [name], Madie Reno, MD   30 g at 08/24/17 0759  . levETIRAcetam (KEPPRA) tablet 1,500 mg  1,500 mg Oral BID [name], [name], MD   1,500 mg at 08/24/17 0800  . lithium carbonate capsule 300 mg  300 mg Oral QHS [name], Madie Reno, MD   300 mg at 08/23/17 2148  . losartan (COZAAR) tablet 25 mg  25 mg Oral Daily [name], Madie Reno, MD   25 mg at 08/24/17 0759  . magnesium hydroxide (MILK OF MAGNESIA) suspension 30 mL  30 mL Oral Daily PRN [name], [name], MD      . metFORMIN (GLUCOPHAGE)  tablet 500 mg  500 mg Oral BID WC [name], Madie Reno, MD   500 mg at 08/24/17 0759  . multivitamin with minerals tablet 1 tablet  1 tablet Oral Daily McNew, Tyson Babinski, MD   1 tablet at 08/24/17 0800  . nadolol (CORGARD) tablet 20 mg  20 mg Oral Daily [name], Madie Reno, MD   20 mg at 08/24/17 0759  . pantoprazole (PROTONIX) EC tablet 40 mg  40 mg Oral Daily [name], [name], MD   40 mg at 08/24/17 0800  . rifaximin (XIFAXAN) tablet 550 mg  550 mg Oral BID [name], Madie Reno, MD   550 mg at 08/24/17 0759  . sucralfate (CARAFATE) tablet 1 g  1 g Oral TID WC & HS [name], Madie Reno, MD   1 g at 08/24/17 1139  . thiamine (VITAMIN B-1) tablet 100 mg  100 mg Oral Daily [name], Madie Reno, MD   100 mg at 08/19/17 0839  . thiamine (VITAMIN B-1) tablet 100 mg  100 mg Oral Daily McNew, Tyson Babinski, MD   100 mg at 08/24/17 0759  . traZODone (DESYREL) tablet 100 mg  100 mg Oral QHS [name], Madie Reno, MD   100 mg at 08/23/17 2148   Current Outpatient Medications  Medication Sig Dispense Refill  . albuterol (PROVENTIL  HFA;VENTOLIN HFA) 108 (90 Base) MCG/ACT inhaler Inhale 2 puffs into the lungs every 4 (four) hours as needed for wheezing or shortness of breath. 1 Inhaler 3  . clozapine (CLOZARIL) 100 MG tablet Take 2 tablets (200 mg total) at bedtime by mouth. 60 tablet 2  . ferrous sulfate 325 (65 FE) MG tablet Take 1 tablet (325 mg total) by mouth daily with breakfast. 30 tablet 3  . folic acid (FOLVITE) 1 MG tablet Take 1 tablet (1 mg total) by mouth daily. 30 tablet 3  . gabapentin (NEURONTIN) 300 MG capsule Take 3 capsules (900 mg total) by mouth 3 (three) times daily. 270 capsule 1  . hydrOXYzine (ATARAX/VISTARIL) 25 MG tablet Take 1 tablet (25 mg total) by mouth 3 (three) times daily as needed for anxiety. 30 tablet 3  . ipratropium-albuterol (DUONEB) 0.5-2.5 (3) MG/3ML SOLN Take 3 mLs by nebulization every 6 (six) hours. 360 mL 1  . lactulose (CHRONULAC) 10 GM/15ML solution Take 67.5 mLs (45 g total) by mouth 3 (three) times  daily. 240 mL 3  . levETIRAcetam (KEPPRA) 750 MG tablet Take 2 tablets (1,500 mg total) by mouth 2 (two) times daily. 120 tablet 3  . lithium carbonate (LITHOBID) 300 MG CR tablet Take 1 tablet (300 mg total) at bedtime by mouth. 30 tablet 1  . LORazepam (ATIVAN) 0.5 MG tablet Take 0.5 mg by mouth every 4 (four) hours as needed for anxiety.    Marland Kitchen losartan (COZAAR) 25 MG tablet Take 1 tablet (25 mg total) by mouth daily. 30 tablet 3  . metFORMIN (GLUCOPHAGE) 500 MG tablet Take 1 tablet (500 mg total) by mouth 2 (two) times daily with a meal. 60 tablet 3  . nadolol (CORGARD) 20 MG tablet Take 1 tablet (20 mg total) by mouth daily. 30 tablet 3  . pantoprazole (PROTONIX) 40 MG tablet Take 1 tablet (40 mg total) by mouth daily. 30 tablet 3  . rifaximin (XIFAXAN) 550 MG TABS tablet Take 1 tablet (550 mg total) by mouth 2 (two) times daily. 60 tablet 3  . sucralfate (CARAFATE) 1 g tablet Take 1 tablet (1 g total) by mouth 4 (four) times daily -  with meals and at bedtime. 120 tablet 3  . thiamine 100 MG tablet Take 1 tablet (100 mg total) by mouth daily. 30 tablet 3  . traZODone (DESYREL) 100 MG tablet Take 100 mg by mouth at bedtime as needed for sleep.     PTA Medications: No medications prior to admission.    Patient Stressors: Financial difficulties Medication change or noncompliance Substance abuse  Patient Strengths: Average or above average intelligence Motivation for treatment/growth Supportive family/friends  Treatment Modalities: Medication Management, Group therapy, Case management,  1 to 1 session with clinician, Psychoeducation, Recreational therapy.   Physician Treatment Plan for Primary Diagnosis: Schizophrenia, undifferentiated ([place]) Long Term Goal(s): Improvement in symptoms so as ready for discharge Improvement in symptoms so as ready for discharge   Short Term Goals: Ability to identify changes in lifestyle to reduce recurrence of condition will improve Ability to  verbalize feelings will improve Ability to maintain clinical measurements within normal limits will improve Compliance with prescribed medications will improve Ability to identify triggers associated with substance abuse/mental health issues will improve  Medication Management: Evaluate patient's response, side effects, and tolerance of medication regimen.  Therapeutic Interventions: 1 to 1 sessions, Unit Group sessions and Medication administration.  Evaluation of Outcomes: Adequate for discharge  Physician Treatment Plan for Secondary Diagnosis: Principal Problem:  Schizophrenia, undifferentiated ([place]) Active Problems:   Alcohol use disorder, severe, dependence ([place])   Controlled type 2 diabetes mellitus without complication ([place])   Essential (primary) hypertension   Obesity   Seizures ([place])  Long Term Goal(s): Improvement in symptoms so as ready for discharge Improvement in symptoms so as ready for discharge   Short Term Goals: Ability to identify changes in lifestyle to reduce recurrence of condition will improve Ability to verbalize feelings will improve Ability to maintain clinical measurements within normal limits will improve Compliance with prescribed medications will improve Ability to identify triggers associated with substance abuse/mental health issues will improve     Medication Management: Evaluate patient's response, side effects, and tolerance of medication regimen.  Therapeutic Interventions: 1 to 1 sessions, Unit Group sessions and Medication administration.  Evaluation of Outcomes:  Adequate for discharge  RN Treatment Plan for Primary Diagnosis: Schizophrenia, undifferentiated ([place]) Long Term Goal(s): Knowledge of disease and therapeutic regimen to maintain health will improve  Short Term Goals: Ability to demonstrate self-control, Ability to identify and develop effective coping behaviors will improve and Compliance with prescribed medications will  improve  Medication Management: RN will administer medications as ordered by provider, will assess and evaluate patient's response and provide education to patient for prescribed medication. RN will report any adverse and/or side effects to prescribing provider.  Therapeutic Interventions: 1 on 1 counseling sessions, Psychoeducation, Medication administration, Evaluate responses to treatment, Monitor vital signs and CBGs as ordered, Perform/monitor CIWA, COWS, AIMS and Fall Risk screenings as ordered, Perform wound care treatments as ordered.  Evaluation of Outcomes: Not Met   LCSW Treatment Plan for Primary Diagnosis: Schizophrenia, undifferentiated ([place]) Long Term Goal(s): Safe transition to appropriate next level of care at discharge, Engage patient in therapeutic group addressing interpersonal concerns.  Short Term Goals: Engage patient in aftercare planning with referrals and resources, Increase social support and Increase skills for wellness and recovery  Therapeutic Interventions: Assess for all discharge needs, 1 to 1 time with Social worker, Explore available resources and support systems, Assess for adequacy in community support network, Educate family and significant other(s) on suicide prevention, Complete Psychosocial Assessment, Interpersonal group therapy.  Evaluation of Outcomes: Adequate for discharge   Progress in Treatment: Attending groups: Yes. Participating in groups: Yes. Taking medication as prescribed: Yes. Toleration medication: Yes. Family/Significant other contact made: No, will contact:  CSW will be contacting pt's mother. Patient understands diagnosis: Yes. Discussing patient identified problems/goals with staff: Yes. Medical problems stabilized or resolved: Yes. Denies suicidal/homicidal ideation: Yes. Issues/concerns per patient self-inventory: No. Other: n/a  New problem(s) identified: No, Describe:  No new problems identified.  New Short Term/Long  Term Goal(s):  Discharge Plan or Barriers: Discharge home and follow up with [facility]  Reason for Continuation of Hospitalization: None  Estimated Length of Stay: 0 days  Recreational Therapy: Patient Stressors: Drinking to stop the voices Patient Goal: Patient will improve communication skills, as demonstrated by ability to actively participate in at least 2 processing discussion during recreation therapy group sessions by conclusion of recreation therapy tx.  Attendees: Patient:  08/24/2017 2:55 PM  Physician:Jolanta Pucilowska,MD 08/24/2017 2:55 PM  Nursing: Ledell Noss, RN 08/24/2017 2:55 PM  RN Care Manager: 08/24/2017 2:55 PM  Social Worker: Dossie Arbour, LCSW 08/24/2017 2:55 PM  Recreational Therapist: Roanna Epley, LRT 08/24/2017 2:55 PM  Other:  08/24/2017 2:55 PM  Other:  08/24/2017 2:55 PM  Other: 08/24/2017 2:55 PM    Scribe for Treatment Team: August Saucer,  LCSW 08/24/2017 2:55 PM

## 2017-08-24 NOTE — Progress Notes (Signed)
Recreation Therapy Notes  Date: 12.03.2018  Time: 9:30 am  Location: [facility]  Behavioral response: N/A  Intervention Topic: Emotions  Discussion/Intervention: Patient did not attend group.  Clinical Observations/Feedback:  Patient did not attend group. Tzipora Mcinroy LRT/CTRS         Oliviah Agostini 08/24/2017 12:41 PM

## 2017-08-24 NOTE — Progress Notes (Signed)
Patient up and dressed. Discharge paper work given he states he understands. He states no more drinking for him. He is pleasant and discharged to his mother.

## 2017-08-24 NOTE — Progress Notes (Signed)
Recreation Therapy Notes  INPATIENT RECREATION TR PLAN  Patient Details Name: Howard [name] MRN: [id number] DOB: 1981/07/13 Today's Date: 08/24/2017  Rec Therapy Plan Is patient appropriate for Therapeutic Recreation?: Yes Treatment times per week: at least 3 Estimated Length of Stay: 5-7 days TR Treatment/Interventions: Group participation (Comment)(Appropriate participation in recreation therapy tx. )  Discharge Criteria Pt will be discharged from therapy if:: Discharged Treatment plan/goals/alternatives discussed and agreed upon by:: Patient/family  Discharge Summary Short term goals set: Patient will improve communication skills, as demonstrated by ability to actively participate in at least 2 processing discussion during recreation therapy group sessions by conclusion of recreation therapy tx Short term goals met: Complete Progress toward goals comments: Groups attended Which groups?: Anger management(Probelm Solving) Reason goals not met: N/A Therapeutic equipment acquired: N/A Reason patient discharged from therapy: Discharge from hospital Pt/family agrees with progress & goals achieved: Yes Date patient discharged from therapy: 08/24/17   Landyn Buckalew 08/24/2017, 12:50 PM

## 2017-08-26 LAB — CLOZAPINE (CLOZARIL)
Clozapine Lvl: 272 ng/mL — ABNORMAL LOW (ref 350–650)
NORCLOZAPINE: 131 ng/mL
TOTAL(CLOZ+ NORCLOZ): 403 ng/mL

## 2017-08-27 ENCOUNTER — Ambulatory Visit: Payer: Medicare Other | Admitting: Psychiatry

## 2017-09-01 ENCOUNTER — Ambulatory Visit: Payer: Medicare Other | Admitting: Licensed Clinical Social Worker

## 2017-09-07 ENCOUNTER — Other Ambulatory Visit: Payer: Self-pay | Admitting: Nurse Practitioner

## 2017-09-07 ENCOUNTER — Other Ambulatory Visit: Payer: Self-pay

## 2017-09-07 ENCOUNTER — Encounter: Payer: Self-pay | Admitting: Emergency Medicine

## 2017-09-07 ENCOUNTER — Emergency Department
Admission: EM | Admit: 2017-09-07 | Discharge: 2017-09-07 | Disposition: A | Discharge status: Home / Self Care | Payer: Medicare Other | Attending: Emergency Medicine | Admitting: Emergency Medicine

## 2017-09-07 ENCOUNTER — Emergency Department: Payer: Medicare Other

## 2017-09-07 DIAGNOSIS — Z79899 Other long term (current) drug therapy: Secondary | ICD-10-CM | POA: Diagnosis not present

## 2017-09-07 DIAGNOSIS — Y92009 Unspecified place in unspecified non-institutional (private) residence as the place of occurrence of the external cause: Secondary | ICD-10-CM | POA: Diagnosis not present

## 2017-09-07 DIAGNOSIS — Y9389 Activity, other specified: Secondary | ICD-10-CM | POA: Insufficient documentation

## 2017-09-07 DIAGNOSIS — E1122 Type 2 diabetes mellitus with diabetic chronic kidney disease: Secondary | ICD-10-CM | POA: Insufficient documentation

## 2017-09-07 DIAGNOSIS — N186 End stage renal disease: Secondary | ICD-10-CM | POA: Diagnosis not present

## 2017-09-07 DIAGNOSIS — Z7984 Long term (current) use of oral hypoglycemic drugs: Secondary | ICD-10-CM | POA: Diagnosis not present

## 2017-09-07 DIAGNOSIS — F329 Major depressive disorder, single episode, unspecified: Secondary | ICD-10-CM | POA: Diagnosis not present

## 2017-09-07 DIAGNOSIS — S3992XA Unspecified injury of lower back, initial encounter: Secondary | ICD-10-CM | POA: Diagnosis present

## 2017-09-07 DIAGNOSIS — I120 Hypertensive chronic kidney disease with stage 5 chronic kidney disease or end stage renal disease: Secondary | ICD-10-CM | POA: Insufficient documentation

## 2017-09-07 DIAGNOSIS — F1020 Alcohol dependence, uncomplicated: Secondary | ICD-10-CM | POA: Insufficient documentation

## 2017-09-07 DIAGNOSIS — S32010A Wedge compression fracture of first lumbar vertebra, initial encounter for closed fracture: Secondary | ICD-10-CM | POA: Insufficient documentation

## 2017-09-07 DIAGNOSIS — W010XXA Fall on same level from slipping, tripping and stumbling without subsequent striking against object, initial encounter: Secondary | ICD-10-CM | POA: Insufficient documentation

## 2017-09-07 DIAGNOSIS — Z66 Do not resuscitate: Secondary | ICD-10-CM | POA: Insufficient documentation

## 2017-09-07 DIAGNOSIS — F209 Schizophrenia, unspecified: Secondary | ICD-10-CM | POA: Insufficient documentation

## 2017-09-07 DIAGNOSIS — Y998 Other external cause status: Secondary | ICD-10-CM | POA: Diagnosis not present

## 2017-09-07 DIAGNOSIS — J449 Chronic obstructive pulmonary disease, unspecified: Secondary | ICD-10-CM | POA: Diagnosis not present

## 2017-09-07 MED ORDER — OXYCODONE HCL 5 MG PO TABS
10.0000 mg | ORAL_TABLET | Freq: Once | ORAL | Status: AC
  Administered 2017-09-07: 10 mg via ORAL
  Filled 2017-09-07: qty 2

## 2017-09-07 MED ORDER — LIDOCAINE 5 % EX PTCH
1.0000 | MEDICATED_PATCH | CUTANEOUS | Status: DC
  Administered 2017-09-07: 1 via TRANSDERMAL
  Filled 2017-09-07: qty 1

## 2017-09-07 MED ORDER — LUMBAR BACK BRACE/SUPPORT PAD MISC: 1.0000 "application " | Freq: Once | 0 refills | Status: AC

## 2017-09-07 NOTE — ED Provider Notes (Signed)
[facility] Emergency Department Provider Note  ____________________________________________  Time seen: Approximately 3:11 PM  I have reviewed the triage vital signs and the nursing notes.   HISTORY  Chief Complaint Back Pain    HPI Howard [name] is a 36 y.o. male that presents the emergency department for evaluation of low back pain after fall.  Patient states that he got a pitbull recently and tripped over a puddle of urine.  He landed on his back.  Pain is primarily on the right side of his back.  Pain is worse with movement and standing. He was able to stand and walk to the [place] to get to the emergency department. He did not hit his head or lose consciousness.  He has no additional injuries or concerns.  He took morphine, which did not help for very long. 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 No nausea, vomiting, abdominal pain, numbness, tingling, weakness.   Past Medical History:  Diagnosis Date  . Alcoholic cirrhosis of liver with ascites (HCC)   . Alcoholism (HCC)   . Anemia   . Atrial fibrillation (HCC)   . COPD (chronic obstructive pulmonary disease) (HCC)   . Depression   . Diabetes mellitus, type II (HCC)   . Esophageal varices (HCC)   . Heart disease    irregular heart beat (palpitations) and heart murmur  . Hyperlipemia   . Hypertension   . Liver disease   . Multiple thyroid nodules   . Portal hypertensive gastropathy (HCC)   . Schizophrenia (HCC)   . Seizures [facility])     Patient Active Problem List   Diagnosis Date Noted  . Schizophrenia, undifferentiated (HCC) 07/09/2017  . Schizophrenia, paranoid (HCC) 06/09/2017  . PTSD (post-traumatic stress disorder) 06/08/2017  . Inflamed external hemorrhoid 05/13/2017  . End stage liver disease (HCC) 04/24/2017  . Esophageal varices (HCC)   . Hematemesis 04/05/2017  . Upper GI bleed   . Iron deficiency anemia due to chronic blood loss 02/16/2017  . Nausea 01/18/2017   . Respiratory failure with hypoxia (HCC) 12/14/2016  . Seizures (HCC) 12/12/2016  . DNR (do not resuscitate) discussion 08/11/2016  . Muscle weakness (generalized)   . GI bleed 08/07/2016  . OSA (obstructive sleep apnea) 04/29/2016  . Anemia 04/29/2016  . Thrombocytopenia (HCC) 04/29/2016  . Coagulopathy (HCC) 04/29/2016  . Obesity 04/29/2016  . Controlled type 2 diabetes mellitus without complication (HCC) 01/15/2016  . Essential (primary) hypertension 12/11/2015  . Pure hypercholesterolemia 12/11/2015  . Hepatic encephalopathy (HCC) 10/16/2015  . 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 Elevated transaminase level 07/04/2015  . Alcohol use disorder, severe, dependence (HCC) 05/31/2015  . Paranoid schizophrenia (HCC) 03/20/2015    Past Surgical History:  Procedure Laterality Date  . ESOPHAGOGASTRODUODENOSCOPY N/A 10/05/2015   Procedure: ESOPHAGOGASTRODUODENOSCOPY (EGD);  Surgeon: Elnita Maxwell, MD;  Location: [facility] ENDOSCOPY;  Service: Endoscopy;  Laterality: N/A;  . ESOPHAGOGASTRODUODENOSCOPY N/A 06/30/2017   Procedure: ESOPHAGOGASTRODUODENOSCOPY (EGD);  Surgeon: Toney Reil, MD;  Location: [facility] ENDOSCOPY;  Service: Gastroenterology;  Laterality: N/A;  . ESOPHAGOGASTRODUODENOSCOPY (EGD) WITH PROPOFOL N/A 08/03/2015   Procedure: ESOPHAGOGASTRODUODENOSCOPY (EGD) WITH PROPOFOL;  Surgeon: Elnita Maxwell, MD;  Location: [facility] ENDOSCOPY;  Service: Endoscopy;  Laterality: N/A;  . ESOPHAGOGASTRODUODENOSCOPY (EGD) WITH PROPOFOL N/A 08/31/2015   Procedure: ESOPHAGOGASTRODUODENOSCOPY (EGD) WITH PROPOFOL;  Surgeon: Elnita Maxwell, MD;  Location: [facility] ENDOSCOPY;  Service: Endoscopy;  Laterality: N/A;  . ESOPHAGOGASTRODUODENOSCOPY (EGD) WITH PROPOFOL N/A 04/04/2016  Procedure: ESOPHAGOGASTRODUODENOSCOPY (EGD) WITH PROPOFOL;  Surgeon: Scot Junobert T Elliott, MD;  [facility] ENDOSCOPY;  Service: Endoscopy;  Laterality: N/A;  . ESOPHAGOGASTRODUODENOSCOPY (EGD)  WITH PROPOFOL N/A 11/13/2016   Procedure: ESOPHAGOGASTRODUODENOSCOPY (EGD) WITH PROPOFOL;  Surgeon: Wyline MoodKiran Anna, MD;  Location: [facility] ENDOSCOPY;  Service: Endoscopy;  Laterality: N/A;  . ESOPHAGOGASTRODUODENOSCOPY (EGD) WITH PROPOFOL N/A 04/07/2017   Procedure: ESOPHAGOGASTRODUODENOSCOPY (EGD) WITH PROPOFOL;  Surgeon: Midge MiniumWohl, Darren, MD;  Location: [facility] ENDOSCOPY;  Service: Endoscopy;  Laterality: N/A;  . NO PAST SURGERIES      Prior to Admission medications   Medication Sig Start Date End Date Taking? Authorizing Provider  morphine 10 MG/5ML solution Take by mouth every 2 (two) hours as needed for severe pain.   Yes [provider]  albuterol (PROVENTIL HFA;VENTOLIN HFA) 108 (90 Base) MCG/ACT inhaler Inhale 2 puffs into the lungs every 4 (four) hours as needed for wheezing or shortness of breath. 06/16/17   [name], Jackquline DenmarkJohn T, MD  clozapine (CLOZARIL) 100 MG tablet Take 2 tablets (200 mg total) at bedtime by mouth. 08/04/17   [name], Jackquline DenmarkJohn T, MD  ferrous sulfate 325 (65 FE) MG tablet Take 1 tablet (325 mg total) by mouth daily with breakfast. 06/17/17   [name], Jackquline DenmarkJohn T, MD  folic acid (FOLVITE) 1 MG tablet Take 1 tablet (1 mg total) by mouth daily. 06/17/17   [name], Jackquline DenmarkJohn T, MD  gabapentin (NEURONTIN) 300 MG capsule Take 3 capsules (900 mg total) by mouth 3 (three) times daily. 08/24/17   Pucilowska, Braulio ConteJolanta B, MD  hydrOXYzine (ATARAX/VISTARIL) 25 MG tablet Take 1 tablet (25 mg total) by mouth 3 (three) times daily as needed for anxiety. 06/16/17   [name], Jackquline DenmarkJohn T, MD  ipratropium-albuterol (DUONEB) 0.5-2.5 (3) MG/3ML SOLN Take 3 mLs by nebulization every 6 (six) hours. 07/01/17   Auburn BilberryPatel, Shreyang, MD  lactulose (CHRONULAC) 10 GM/15ML solution Take 67.5 mLs (45 g total) by mouth 3 (three) times daily. 06/16/17   [name], Jackquline DenmarkJohn T, MD  levETIRAcetam (KEPPRA) 750 MG tablet Take 2 tablets (1,500 mg total) by mouth 2 (two) times daily. 06/16/17   [name], Jackquline DenmarkJohn T, MD  lithium carbonate (LITHOBID) 300 MG CR  tablet Take 1 tablet (300 mg total) at bedtime by mouth. 08/04/17   [name], Jackquline DenmarkJohn T, MD  LORazepam (ATIVAN) 0.5 MG tablet Take 0.5 mg by mouth every 4 (four) hours as needed for anxiety.    [provider]  losartan (COZAAR) 25 MG tablet Take 1 tablet (25 mg total) by mouth daily. 06/17/17   [name], Jackquline DenmarkJohn T, MD  metFORMIN (GLUCOPHAGE) 500 MG tablet Take 1 tablet (500 mg total) by mouth 2 (two) times daily with a meal. 06/16/17   [name], Jackquline DenmarkJohn T, MD  nadolol (CORGARD) 20 MG tablet Take 1 tablet (20 mg total) by mouth daily. 06/17/17   [name], Jackquline DenmarkJohn T, MD  pantoprazole (PROTONIX) 40 MG tablet Take 1 tablet (40 mg total) by mouth daily. 06/17/17   [name], Jackquline DenmarkJohn T, MD  rifaximin (XIFAXAN) 550 MG TABS tablet Take 1 tablet (550 mg total) by mouth 2 (two) times daily. 06/16/17   [name], Jackquline DenmarkJohn T, MD  sucralfate (CARAFATE) 1 g tablet Take 1 tablet (1 g total) by mouth 4 (four) times daily -  with meals and at bedtime. 06/16/17   [name], Jackquline DenmarkJohn T, MD  thiamine 100 MG tablet Take 1 tablet (100 mg total) by mouth daily. 06/17/17   [name], Jackquline DenmarkJohn T, MD  traZODone (DESYREL) 100 MG tablet Take 100 mg by mouth at bedtime  as needed for sleep.    [provider]    Allergies Tramadol  Family History  Problem Relation Age of Onset  . Heart disease Mother   . Hypertension Mother   . Hyperlipidemia Mother   . Stroke Father   . Heart attack Father   . Hypertension Father   . Heart disease Father   . Alcohol abuse Father   . Heart disease Brother     Social History Social History   Tobacco Use  . Smoking status: Never Smoker  . Smokeless tobacco: Never Used  Substance Use Topics  . Alcohol use: Yes    Alcohol/week: 0.0 oz    Comment: last drink 1 days ago (noted: 05/30/2017)  . Drug use: No     Review of Systems  Gastrointestinal: No abdominal pain.  No nausea, no vomiting.  Musculoskeletal: Positive for low back pain. Skin: Negative for rash, abrasions, lacerations,  ecchymosis. Neurological: Negative for headaches   ____________________________________________   PHYSICAL EXAM:  VITAL SIGNS: ED Triage Vitals  Enc Vitals Group     BP 09/07/17 1324 132/71     Pulse Rate 09/07/17 1324 (!) 110     Resp 09/07/17 1324 18     Temp 09/07/17 1324 98.2 F (36.8 C)     Temp Source 09/07/17 1324 Oral     SpO2 09/07/17 1324 94 %     Weight 09/07/17 1331 (!) 340 lb (154.2 kg)     Height 09/07/17 1331 6\' 3"  (1.905 m)     Head Circumference --      Peak Flow --      Pain Score 09/07/17 1324 9     Pain Loc --      Pain Edu? --      Excl. in GC? --      Constitutional: Alert and oriented. Well appearing and in no acute distress. Eyes: Conjunctivae are normal. PERRL. EOMI. Head: Atraumatic. ENT:      Ears:      Nose: No congestion/rhinnorhea.  Oxygen in place.      Mouth/Throat: Mucous membranes are moist.  Neck: No stridor. Cardiovascular: Normal rate, regular rhythm.  Good peripheral circulation. Respiratory: Normal respiratory effort without tachypnea or retractions. Lungs CTAB. Good air entry to the bases with no decreased or absent breath sounds. Gastrointestinal: Bowel sounds 4 quadrants. Soft and nontender to palpation. No guarding or rigidity. No palpable masses. No distention.  Musculoskeletal: Full range of motion to all extremities. No gross deformities appreciated.  Tenderness to palpation over right lumbar muscles. Mild tenderness to palpation over superior lumbar spine. Pain worse with standing. Strength in lower extremities symmetric bilaterally but limited due to current pain situation. Sensation intact.  Neurologic:  Normal speech and language. No gross focal neurologic deficits are appreciated.  Skin:  Skin is warm, dry and intact. No rash noted.  ____________________________________________   LABS (all labs ordered are listed, but only abnormal results are displayed)  Labs Reviewed - No data to  display ____________________________________________  EKG   ____________________________________________  RADIOLOGY Lexine BatonI, Farron Lafond, personally viewed and evaluated these images (plain radiographs) as part of my medical decision making, as well as reviewing the written report by the radiologist.  Dg Lumbar Spine Complete  Result Date: 09/07/2017 CLINICAL DATA:  Pain to lower back status post fall this morning. EXAM: LUMBAR SPINE - COMPLETE 4+ VIEW COMPARISON:  CT abdomen dated 04/21/2017. FINDINGS: Compression fracture deformity of the L1 vertebral body, partially obscured by overlying soft tissues,  new compared to the earlier CT of 04/21/2017. No evidence of associated vertebral body displacement or retropulsion. Remainder of the lumbar vertebral bodies remain normal in height and alignment. Upper sacrum appears intact and normally aligned. Visualized paravertebral soft tissues are unremarkable. IMPRESSION: 1. New compression fracture deformity of the L1 vertebral body, with approximately 25% compression anteriorly, partially obscured by overlying soft tissues. No evidence of significant vertebral body displacement or retropulsion. Recommend CT or MRI of the thoracolumbar spine for more definitive characterization. 2. Remainder of the lumbar spine appears intact and normally aligned. Electronically Signed   By: Bary Richard M.D.   On: 09/07/2017 15:15    ____________________________________________    PROCEDURES  Procedure(s) performed:    Procedures    Medications  oxyCODONE (Oxy IR/ROXICODONE) immediate release tablet 10 mg (10 mg Oral Given 09/07/17 1542)     ____________________________________________   INITIAL IMPRESSION / ASSESSMENT AND PLAN / ED COURSE  Pertinent labs & imaging results that were available during my care of the patient were reviewed by me and considered in my medical decision making (see chart for details).  Review of the [place] CSRS was performed in  accordance of the [facility] prior to dispensing any controlled drugs.   Patient presented to the emergency department for evaluation of low back pain after fall.  Vital signs and exam are reassuring.  Xray consistent with 25% compression fracture of L1. Patient is neurologically intact. No weakness, numbness, tingling. Dr. Adriana Simas with neurosurgery was consulted and recommended back brace and follow up in clinic with him. Patient is on Hospice and will take the morphine he is prescribed for pain. He will call hospice to discuss getting a home health nurse. He will be written a prescription for a back brace and will pick it up from the medical supply store today or tomorrow. Patient is to follow up with PCP and neurosurgery as directed. Patient is given ED precautions to return to the ED for any worsening or new symptoms.     ____________________________________________  FINAL CLINICAL IMPRESSION(S) / ED DIAGNOSES  Final diagnoses:  Closed compression fracture of first lumbar vertebra, initial encounter (HCC)      NEW MEDICATIONS STARTED DURING THIS VISIT:  ED Discharge Orders        Ordered    Elastic Bandages & Supports (LUMBAR BACK BRACE/SUPPORT PAD) MISC   Once     09/07/17 1611          This chart was dictated using voice recognition software/Dragon. Despite best efforts to proofread, errors can occur which can change the meaning. Any change was purely unintentional.    Enid Derry, PA-C 09/08/17 1610    Minna Antis, MD 09/08/17 2330

## 2017-09-07 NOTE — ED Notes (Signed)
See triage note  Presents with pain to lower back s/p fall  States he slipped on wet floor

## 2017-09-07 NOTE — Progress Notes (Signed)
Error

## 2017-09-07 NOTE — ED Triage Notes (Signed)
Low back pain since fell 6 am today.

## 2017-09-07 NOTE — Discharge Instructions (Signed)
Take Morphine as prescribed by hospice for pain. Make appointment with primary care and neurosurgery as soon as possible. Call hospice to discuss home health nurse. Use walker at home. Wear back brace until follow up with neurosurgery.

## 2017-09-12 ENCOUNTER — Emergency Department: Payer: Medicare Other

## 2017-09-12 ENCOUNTER — Other Ambulatory Visit: Payer: Self-pay

## 2017-09-12 ENCOUNTER — Inpatient Hospital Stay
Admission: EM | Admit: 2017-09-12 | Discharge: 2017-10-23 | DRG: 871 | Disposition: E | Payer: Medicare Other | Attending: Internal Medicine | Admitting: Internal Medicine

## 2017-09-12 ENCOUNTER — Encounter: Payer: Self-pay | Admitting: Emergency Medicine

## 2017-09-12 DIAGNOSIS — Z79899 Other long term (current) drug therapy: Secondary | ICD-10-CM

## 2017-09-12 DIAGNOSIS — R339 Retention of urine, unspecified: Secondary | ICD-10-CM | POA: Diagnosis not present

## 2017-09-12 DIAGNOSIS — F209 Schizophrenia, unspecified: Secondary | ICD-10-CM | POA: Diagnosis present

## 2017-09-12 DIAGNOSIS — K7040 Alcoholic hepatic failure without coma: Secondary | ICD-10-CM | POA: Diagnosis present

## 2017-09-12 DIAGNOSIS — J9621 Acute and chronic respiratory failure with hypoxia: Secondary | ICD-10-CM | POA: Diagnosis present

## 2017-09-12 DIAGNOSIS — E785 Hyperlipidemia, unspecified: Secondary | ICD-10-CM | POA: Diagnosis present

## 2017-09-12 DIAGNOSIS — R188 Other ascites: Secondary | ICD-10-CM

## 2017-09-12 DIAGNOSIS — Z885 Allergy status to narcotic agent status: Secondary | ICD-10-CM

## 2017-09-12 DIAGNOSIS — Z8249 Family history of ischemic heart disease and other diseases of the circulatory system: Secondary | ICD-10-CM

## 2017-09-12 DIAGNOSIS — M4856XA Collapsed vertebra, not elsewhere classified, lumbar region, initial encounter for fracture: Secondary | ICD-10-CM | POA: Diagnosis present

## 2017-09-12 DIAGNOSIS — G9340 Encephalopathy, unspecified: Secondary | ICD-10-CM

## 2017-09-12 DIAGNOSIS — J440 Chronic obstructive pulmonary disease with acute lower respiratory infection: Secondary | ICD-10-CM | POA: Diagnosis not present

## 2017-09-12 DIAGNOSIS — M545 Low back pain: Secondary | ICD-10-CM

## 2017-09-12 DIAGNOSIS — E512 Wernicke's encephalopathy: Secondary | ICD-10-CM | POA: Diagnosis present

## 2017-09-12 DIAGNOSIS — E876 Hypokalemia: Secondary | ICD-10-CM | POA: Diagnosis not present

## 2017-09-12 DIAGNOSIS — J9811 Atelectasis: Secondary | ICD-10-CM

## 2017-09-12 DIAGNOSIS — I8510 Secondary esophageal varices without bleeding: Secondary | ICD-10-CM | POA: Diagnosis present

## 2017-09-12 DIAGNOSIS — G92 Toxic encephalopathy: Secondary | ICD-10-CM | POA: Diagnosis not present

## 2017-09-12 DIAGNOSIS — Z66 Do not resuscitate: Secondary | ICD-10-CM | POA: Diagnosis present

## 2017-09-12 DIAGNOSIS — Z515 Encounter for palliative care: Secondary | ICD-10-CM | POA: Diagnosis not present

## 2017-09-12 DIAGNOSIS — J189 Pneumonia, unspecified organism: Secondary | ICD-10-CM | POA: Diagnosis present

## 2017-09-12 DIAGNOSIS — F1020 Alcohol dependence, uncomplicated: Secondary | ICD-10-CM | POA: Diagnosis present

## 2017-09-12 DIAGNOSIS — E871 Hypo-osmolality and hyponatremia: Secondary | ICD-10-CM | POA: Diagnosis present

## 2017-09-12 DIAGNOSIS — I959 Hypotension, unspecified: Secondary | ICD-10-CM | POA: Diagnosis not present

## 2017-09-12 DIAGNOSIS — K7290 Hepatic failure, unspecified without coma: Secondary | ICD-10-CM | POA: Diagnosis not present

## 2017-09-12 DIAGNOSIS — Z0189 Encounter for other specified special examinations: Secondary | ICD-10-CM

## 2017-09-12 DIAGNOSIS — I4891 Unspecified atrial fibrillation: Secondary | ICD-10-CM | POA: Diagnosis present

## 2017-09-12 DIAGNOSIS — K7031 Alcoholic cirrhosis of liver with ascites: Secondary | ICD-10-CM | POA: Diagnosis present

## 2017-09-12 DIAGNOSIS — Z978 Presence of other specified devices: Secondary | ICD-10-CM

## 2017-09-12 DIAGNOSIS — F4310 Post-traumatic stress disorder, unspecified: Secondary | ICD-10-CM | POA: Diagnosis present

## 2017-09-12 DIAGNOSIS — E7800 Pure hypercholesterolemia, unspecified: Secondary | ICD-10-CM | POA: Diagnosis present

## 2017-09-12 DIAGNOSIS — E119 Type 2 diabetes mellitus without complications: Secondary | ICD-10-CM

## 2017-09-12 DIAGNOSIS — K567 Ileus, unspecified: Secondary | ICD-10-CM

## 2017-09-12 DIAGNOSIS — E042 Nontoxic multinodular goiter: Secondary | ICD-10-CM | POA: Diagnosis present

## 2017-09-12 DIAGNOSIS — E872 Acidosis: Secondary | ICD-10-CM | POA: Diagnosis present

## 2017-09-12 DIAGNOSIS — W19XXXA Unspecified fall, initial encounter: Secondary | ICD-10-CM | POA: Diagnosis present

## 2017-09-12 DIAGNOSIS — A419 Sepsis, unspecified organism: Principal | ICD-10-CM

## 2017-09-12 DIAGNOSIS — J811 Chronic pulmonary edema: Secondary | ICD-10-CM

## 2017-09-12 DIAGNOSIS — J9690 Respiratory failure, unspecified, unspecified whether with hypoxia or hypercapnia: Secondary | ICD-10-CM

## 2017-09-12 DIAGNOSIS — G4733 Obstructive sleep apnea (adult) (pediatric): Secondary | ICD-10-CM | POA: Diagnosis present

## 2017-09-12 DIAGNOSIS — F329 Major depressive disorder, single episode, unspecified: Secondary | ICD-10-CM | POA: Diagnosis present

## 2017-09-12 DIAGNOSIS — J9601 Acute respiratory failure with hypoxia: Secondary | ICD-10-CM | POA: Diagnosis not present

## 2017-09-12 DIAGNOSIS — K652 Spontaneous bacterial peritonitis: Secondary | ICD-10-CM | POA: Diagnosis present

## 2017-09-12 DIAGNOSIS — J690 Pneumonitis due to inhalation of food and vomit: Secondary | ICD-10-CM | POA: Diagnosis not present

## 2017-09-12 DIAGNOSIS — I8500 Esophageal varices without bleeding: Secondary | ICD-10-CM | POA: Diagnosis present

## 2017-09-12 DIAGNOSIS — D6959 Other secondary thrombocytopenia: Secondary | ICD-10-CM | POA: Diagnosis present

## 2017-09-12 DIAGNOSIS — K766 Portal hypertension: Secondary | ICD-10-CM | POA: Diagnosis present

## 2017-09-12 DIAGNOSIS — F10231 Alcohol dependence with withdrawal delirium: Secondary | ICD-10-CM | POA: Diagnosis not present

## 2017-09-12 DIAGNOSIS — J90 Pleural effusion, not elsewhere classified: Secondary | ICD-10-CM | POA: Diagnosis not present

## 2017-09-12 DIAGNOSIS — Z6839 Body mass index (BMI) 39.0-39.9, adult: Secondary | ICD-10-CM

## 2017-09-12 DIAGNOSIS — D638 Anemia in other chronic diseases classified elsewhere: Secondary | ICD-10-CM | POA: Diagnosis present

## 2017-09-12 DIAGNOSIS — J9622 Acute and chronic respiratory failure with hypercapnia: Secondary | ICD-10-CM | POA: Diagnosis not present

## 2017-09-12 DIAGNOSIS — R791 Abnormal coagulation profile: Secondary | ICD-10-CM | POA: Diagnosis present

## 2017-09-12 DIAGNOSIS — Z811 Family history of alcohol abuse and dependence: Secondary | ICD-10-CM

## 2017-09-12 DIAGNOSIS — Z7984 Long term (current) use of oral hypoglycemic drugs: Secondary | ICD-10-CM

## 2017-09-12 DIAGNOSIS — I10 Essential (primary) hypertension: Secondary | ICD-10-CM | POA: Diagnosis present

## 2017-09-12 DIAGNOSIS — K7011 Alcoholic hepatitis with ascites: Secondary | ICD-10-CM | POA: Diagnosis not present

## 2017-09-12 DIAGNOSIS — G40909 Epilepsy, unspecified, not intractable, without status epilepticus: Secondary | ICD-10-CM | POA: Diagnosis present

## 2017-09-12 LAB — BODY FLUID CELL COUNT WITH DIFFERENTIAL
EOS FL: 0 %
LYMPHS FL: 7 %
MONOCYTE-MACROPHAGE-SEROUS FLUID: 17 %
NEUTROPHIL FLUID: 76 %
OTHER CELLS FL: 0 %
Total Nucleated Cell Count, Fluid: 1416 cu mm

## 2017-09-12 LAB — BASIC METABOLIC PANEL
ANION GAP: 7 (ref 5–15)
BUN: 22 mg/dL — AB (ref 6–20)
CALCIUM: 7.8 mg/dL — AB (ref 8.9–10.3)
CO2: 34 mmol/L — AB (ref 22–32)
Chloride: 93 mmol/L — ABNORMAL LOW (ref 101–111)
Creatinine, Ser: 1 mg/dL (ref 0.61–1.24)
GFR calc Af Amer: >60 mL/min (ref >60)
GFR calc non Af Amer: >60 mL/min (ref >60)
GLUCOSE: 127 mg/dL — AB (ref 65–99)
POTASSIUM: 3.5 mmol/L (ref 3.5–5.1)
Sodium: 134 mmol/L — ABNORMAL LOW (ref 135–145)

## 2017-09-12 LAB — URINALYSIS, COMPLETE (UACMP) WITH MICROSCOPIC
Glucose, UA: NEGATIVE mg/dL
KETONES UR: NEGATIVE mg/dL
Leukocytes, UA: NEGATIVE
Nitrite: NEGATIVE
PROTEIN: 100 mg/dL — AB
Specific Gravity, Urine: 1.032 — ABNORMAL HIGH (ref 1.005–1.030)
pH: 5 (ref 5.0–8.0)

## 2017-09-12 LAB — CBC
HEMATOCRIT: 39.9 % — AB (ref 40.0–52.0)
HEMOGLOBIN: 12.4 g/dL — AB (ref 13.0–18.0)
MCH: 26.6 pg (ref 26.0–34.0)
MCHC: 31 g/dL — AB (ref 32.0–36.0)
MCV: 85.7 fL (ref 80.0–100.0)
Platelets: 135 10*3/uL — ABNORMAL LOW (ref 150–440)
RBC: 4.66 MIL/uL (ref 4.40–5.90)
RDW: 17.7 % — ABNORMAL HIGH (ref 11.5–14.5)
WBC: 25.2 10*3/uL — ABNORMAL HIGH (ref 3.8–10.6)

## 2017-09-12 LAB — HEPATIC FUNCTION PANEL
ALBUMIN: 2.5 g/dL — AB (ref 3.5–5.0)
ALK PHOS: 109 U/L (ref 38–126)
ALT: 27 U/L (ref 17–63)
AST: 58 U/L — ABNORMAL HIGH (ref 15–41)
BILIRUBIN TOTAL: 2.4 mg/dL — AB (ref 0.3–1.2)
Bilirubin, Direct: 0.8 mg/dL — ABNORMAL HIGH (ref 0.1–0.5)
Indirect Bilirubin: 1.6 mg/dL — ABNORMAL HIGH (ref 0.3–0.9)
Total Protein: 8.1 g/dL (ref 6.5–8.1)

## 2017-09-12 LAB — LACTIC ACID, PLASMA: LACTIC ACID, VENOUS: 2.1 mmol/L — AB (ref 0.5–1.9)

## 2017-09-12 LAB — AMMONIA: AMMONIA: 78 umol/L — AB (ref 9–35)

## 2017-09-12 LAB — GLUCOSE, CAPILLARY: Glucose-Capillary: 122 mg/dL — ABNORMAL HIGH (ref 65–99)

## 2017-09-12 LAB — ETHANOL

## 2017-09-12 MED ORDER — CEFTRIAXONE SODIUM IN DEXTROSE 20 MG/ML IV SOLN
INTRAVENOUS | Status: AC
  Filled 2017-09-12: qty 50

## 2017-09-12 MED ORDER — DEXTROSE 5 % IV SOLN
2.0000 g | INTRAVENOUS | Status: DC
  Administered 2017-09-12 – 2017-09-17 (×6): 2 g via INTRAVENOUS
  Filled 2017-09-12 (×6): qty 2

## 2017-09-12 MED ORDER — SODIUM CHLORIDE 0.9 % IV BOLUS (SEPSIS)
1000.0000 mL | Freq: Once | INTRAVENOUS | Status: AC
  Administered 2017-09-12: 1000 mL via INTRAVENOUS

## 2017-09-12 MED ORDER — LIDOCAINE HCL (PF) 1 % IJ SOLN
5.0000 mL | Freq: Once | INTRAMUSCULAR | Status: AC
  Administered 2017-09-12: 5 mL
  Filled 2017-09-12: qty 5

## 2017-09-12 NOTE — ED Notes (Signed)
RT unavailable to transport

## 2017-09-12 NOTE — ED Triage Notes (Addendum)
Lower Back pain since falling on Monday, pt is hypoxic in triage did not wear oxygen sats in mid 80s on RA, pt has large distended abdomen, and is tachycardic.  Eyes appear blood shot, pt does seem altered, but is able to tell me where he is why he is here and the date.

## 2017-09-12 NOTE — ED Notes (Signed)
No abx ordered

## 2017-09-12 NOTE — ED Notes (Signed)
D/w Dr. Fanny BienQuale, new orders for CXR, NH4 and Lactic acid.

## 2017-09-12 NOTE — H&P (Signed)
[facility] [place] [facility]   PATIENT NAME: Howard [name]    MR#:  [id number]  DATE OF BIRTH:  [birth year]  DATE OF ADMISSION:  08/25/2017  PRIMARY CARE PHYSICIAN: Galen ManilaKennedy, [name], NP   REQUESTING/REFERRING PHYSICIAN:   CHIEF COMPLAINT:   Chief Complaint  Patient presents with  . Back Pain    HISTORY OF PRESENT ILLNESS: Howard [name]  is a 36 y.o. male with a known history per below brought into the emergency room with complaint of fall on Monday, acute back pain, upon evaluation patient noted to be satting in the 80s on room air, noted altered mental status in triage, ER workup noted for sepsis picture, paracentesis done-noted white blood cell count greater than 1400 concerning for SBP, chest x-ray noted for questionable left pneumonia/periventricular congestion/COPD, white count 25,000, sinus tachycardia with heart rate 118, lactic acid 2.1, total bili 2.4, ammonia level 7.8, patient is now been admitted for acute sepsis secondary to SBP, acute possible left pneumonia, hepatic encephalopathy secondary to alcoholic cirrhosis with associated ascites. PAST MEDICAL HISTORY:   Past Medical History:  Diagnosis Date  . Alcoholic cirrhosis of liver with ascites (HCC)   . Alcoholism (HCC)   . Anemia   . Atrial fibrillation (HCC)   . COPD (chronic obstructive pulmonary disease) (HCC)   . Depression   . Diabetes mellitus, type II (HCC)   . Esophageal varices (HCC)   . Heart disease    irregular heart beat (palpitations) and heart murmur  . Hyperlipemia   . Hypertension   . Liver disease   . Multiple thyroid nodules   . Portal hypertensive gastropathy (HCC)   . Schizophrenia (HCC)   . Seizures (HCC)     PAST SURGICAL HISTORY:  Past Surgical History:  Procedure Laterality Date  . ESOPHAGOGASTRODUODENOSCOPY N/A 10/05/2015   Procedure: ESOPHAGOGASTRODUODENOSCOPY (EGD);  Surgeon: Elnita MaxwellMatthew Gordon Rein, MD;  [facility];  Service: Endoscopy;   Laterality: N/A;  . ESOPHAGOGASTRODUODENOSCOPY N/A 06/30/2017   Procedure: ESOPHAGOGASTRODUODENOSCOPY (EGD);  Surgeon: Toney ReilVanga, Rohini Reddy, MD;  [facility];  Service: Gastroenterology;  Laterality: N/A;  . ESOPHAGOGASTRODUODENOSCOPY (EGD) WITH PROPOFOL N/A 08/03/2015   Procedure: ESOPHAGOGASTRODUODENOSCOPY (EGD) WITH PROPOFOL;  Surgeon: Elnita MaxwellMatthew Gordon Rein, MD;  [facility];  Service: Endoscopy;  Laterality: N/A;  . ESOPHAGOGASTRODUODENOSCOPY (EGD) WITH PROPOFOL N/A 08/31/2015   Procedure: ESOPHAGOGASTRODUODENOSCOPY (EGD) WITH PROPOFOL;  Surgeon: Elnita MaxwellMatthew Gordon Rein, MD;  [facility];  Service: Endoscopy;  Laterality: N/A;  . ESOPHAGOGASTRODUODENOSCOPY (EGD) WITH PROPOFOL N/A 04/04/2016   Procedure: ESOPHAGOGASTRODUODENOSCOPY (EGD) WITH PROPOFOL;  Surgeon: Scot Junobert T Elliott, MD;  [facility];  Service: Endoscopy;  Laterality: N/A;  . ESOPHAGOGASTRODUODENOSCOPY (EGD) WITH PROPOFOL N/A 11/13/2016   Procedure: ESOPHAGOGASTRODUODENOSCOPY (EGD) WITH PROPOFOL;  Surgeon: Wyline MoodKiran Anna, MD;  Location: [facility] ENDOSCOPY;  Service: Endoscopy;  Laterality: N/A;  . ESOPHAGOGASTRODUODENOSCOPY (EGD) WITH PROPOFOL N/A 04/07/2017   Procedure: ESOPHAGOGASTRODUODENOSCOPY (EGD) WITH PROPOFOL;  Surgeon: Midge MiniumWohl, Darren, MD;  Location: [facility] ENDOSCOPY;  Service: Endoscopy;  Laterality: N/A;  . NO PAST SURGERIES      SOCIAL HISTORY:  Social History   Tobacco Use  . Smoking status: Never Smoker  . Smokeless tobacco: Never Used  Substance Use Topics  . Alcohol use: Yes    Alcohol/week: 0.0 oz    Comment: last drink 1 days ago (noted: 05/30/2017)    FAMILY HISTORY:  Family History  Problem Relation Age of Onset  . Heart disease Mother   . Hypertension Mother   . Hyperlipidemia  Mother   . Stroke Father   . Heart attack Father   . Hypertension Father   . Heart disease Father   . Alcohol abuse Father   . Heart disease Brother     DRUG ALLERGIES:  Allergies   Allergen Reactions  . Tramadol Itching    REVIEW OF SYSTEMS:  Poor historian CONSTITUTIONAL: No fever, fatigue or weakness.  EYES: No blurred or double vision.  EARS, NOSE, AND THROAT: No tinnitus or ear pain.  RESPIRATORY: No cough, shortness of breath, wheezing or hemoptysis.  CARDIOVASCULAR: No chest pain, orthopnea, edema.  GASTROINTESTINAL: No nausea, vomiting, diarrhea or abdominal pain.  GENITOURINARY: No dysuria, hematuria.  ENDOCRINE: No polyuria, nocturia,  HEMATOLOGY: No anemia, easy bruising or bleeding SKIN: No rash or lesion. MUSCULOSKELETAL: Back pain    NEUROLOGIC: No tingling, numbness, weakness.  PSYCHIATRY: No anxiety or depression.   MEDICATIONS AT HOME:  Prior to Admission medications   Medication Sig Start Date End Date Taking? Authorizing Provider  albuterol (PROVENTIL HFA;VENTOLIN HFA) 108 (90 Base) MCG/ACT inhaler Inhale 2 puffs into the lungs every 4 (four) hours as needed for wheezing or shortness of breath. 06/16/17   [name], Jackquline DenmarkJohn T, MD  clozapine (CLOZARIL) 100 MG tablet Take 2 tablets (200 mg total) at bedtime by mouth. 08/04/17   [name], Jackquline DenmarkJohn T, MD  ferrous sulfate 325 (65 FE) MG tablet Take 1 tablet (325 mg total) by mouth daily with breakfast. 06/17/17   [name], Jackquline DenmarkJohn T, MD  folic acid (FOLVITE) 1 MG tablet Take 1 tablet (1 mg total) by mouth daily. 06/17/17   [name], Jackquline DenmarkJohn T, MD  gabapentin (NEURONTIN) 300 MG capsule Take 3 capsules (900 mg total) by mouth 3 (three) times daily. 08/24/17   Pucilowska, Braulio ConteJolanta B, MD  hydrOXYzine (ATARAX/VISTARIL) 25 MG tablet Take 1 tablet (25 mg total) by mouth 3 (three) times daily as needed for anxiety. 06/16/17   [name], Jackquline DenmarkJohn T, MD  ipratropium-albuterol (DUONEB) 0.5-2.5 (3) MG/3ML SOLN Take 3 mLs by nebulization every 6 (six) hours. 07/01/17   Auburn BilberryPatel, Shreyang, MD  lactulose (CHRONULAC) 10 GM/15ML solution Take 67.5 mLs (45 g total) by mouth 3 (three) times daily. 06/16/17   [name], Jackquline DenmarkJohn T, MD  levETIRAcetam  (KEPPRA) 750 MG tablet Take 2 tablets (1,500 mg total) by mouth 2 (two) times daily. 06/16/17   [name], Jackquline DenmarkJohn T, MD  lithium carbonate (LITHOBID) 300 MG CR tablet Take 1 tablet (300 mg total) at bedtime by mouth. 08/04/17   [name], Jackquline DenmarkJohn T, MD  LORazepam (ATIVAN) 0.5 MG tablet Take 0.5 mg by mouth every 4 (four) hours as needed for anxiety.    [provider]  losartan (COZAAR) 25 MG tablet Take 1 tablet (25 mg total) by mouth daily. 06/17/17   [name], Jackquline DenmarkJohn T, MD  metFORMIN (GLUCOPHAGE) 500 MG tablet Take 1 tablet (500 mg total) by mouth 2 (two) times daily with a meal. 06/16/17   [name], Jackquline DenmarkJohn T, MD  morphine 10 MG/5ML solution Take by mouth every 2 (two) hours as needed for severe pain.    [provider]  nadolol (CORGARD) 20 MG tablet Take 1 tablet (20 mg total) by mouth daily. 06/17/17   [name], Jackquline DenmarkJohn T, MD  pantoprazole (PROTONIX) 40 MG tablet Take 1 tablet (40 mg total) by mouth daily. 06/17/17   [name], Jackquline DenmarkJohn T, MD  rifaximin (XIFAXAN) 550 MG TABS tablet Take 1 tablet (550 mg total) by mouth 2 (two) times daily. 06/16/17   [name], Jackquline DenmarkJohn T, MD  sucralfate (CARAFATE)  1 g tablet Take 1 tablet (1 g total) by mouth 4 (four) times daily -  with meals and at bedtime. 06/16/17   [name], [name], MD  thiamine 100 MG tablet Take 1 tablet (100 mg total) by mouth daily. 06/17/17   [name], [name], MD  traZODone (DESYREL) 100 MG tablet Take 100 mg by mouth at bedtime as needed for sleep.    [provider]      PHYSICAL EXAMINATION:   VITAL SIGNS: Blood pressure (!) 147/101, pulse (!) 120, temperature 98.2 F (36.8 C), temperature source Oral, resp. rate 20, height 6\' 3"  (1.905 m), weight (!) 154.2 kg (340 lb), SpO2 100 %.  GENERAL:  36 y.o.-year-old patient lying in the bed with no acute distress.  Morbid obesity, nontoxic-appearing EYES: Pupils equal, round, reactive to light and accommodation. No scleral icterus. Extraocular muscles intact.  HEENT: Head atraumatic,  normocephalic. Oropharynx and nasopharynx clear.  NECK:  Supple, no jugular venous distention. No thyroid enlargement, no tenderness.  LUNGS: Normal breath sounds bilaterally, no wheezing, rales,rhonchi or crepitation. No use of accessory muscles of respiration.  CARDIOVASCULAR: S1, S2 normal. No murmurs, rubs, or gallops.  ABDOMEN: Distended abdomen, nontender/no rebound, hypoactive bowel sounds   EXTREMITIES: Bilateral lower extremity edema, cyanosis, or clubbing.  NEUROLOGIC: Cranial nerves II through XII are intact. MAES. Gait not checked.  PSYCHIATRIC: The patient is lethargic, confused, oriented x1-2 .  SKIN: No obvious rash, lesion, or ulcer.   LABORATORY PANEL:   CBC Recent Labs  Lab 09/18/2017 1723  WBC 25.2*  HGB 12.4*  HCT 39.9*  PLT 135*  MCV 85.7  MCH 26.6  MCHC 31.0*  RDW 17.7*   ------------------------------------------------------------------------------------------------------------------  Chemistries  Recent Labs  Lab 09/13/2017 1723  NA 134*  K 3.5  CL 93*  CO2 34*  GLUCOSE 127*  BUN 22*  CREATININE 1.00  CALCIUM 7.8*  AST 58*  ALT 27  ALKPHOS 109  BILITOT 2.4*   ------------------------------------------------------------------------------------------------------------------ estimated creatinine clearance is 159.2 mL/min (by C-G formula based on SCr of 1 mg/dL). ------------------------------------------------------------------------------------------------------------------ No results for input(s): TSH, T4TOTAL, T3FREE, THYROIDAB in the last 72 hours.  Invalid input(s): FREET3   Coagulation profile No results for input(s): INR, PROTIME in the last 168 hours. ------------------------------------------------------------------------------------------------------------------- No results for input(s): DDIMER in the last 72  hours. -------------------------------------------------------------------------------------------------------------------  Cardiac Enzymes No results for input(s): CKMB, TROPONINI, MYOGLOBIN in the last 168 hours.  Invalid input(s): CK ------------------------------------------------------------------------------------------------------------------ Invalid input(s): POCBNP  ---------------------------------------------------------------------------------------------------------------  Urinalysis    Component Value Date/Time   COLORURINE AMBER (A) 04/17/2017 2102   APPEARANCEUR CLOUDY (A) 04/17/2017 2102   APPEARANCEUR Clear 12/25/2014 1404   LABSPEC 1.018 04/17/2017 2102   LABSPEC 1.016 12/25/2014 1404   PHURINE 5.0 04/17/2017 2102   GLUCOSEU NEGATIVE 04/17/2017 2102   GLUCOSEU Negative 12/25/2014 1404   HGBUR LARGE (A) 04/17/2017 2102   BILIRUBINUR Mod 06/26/2017 1558   BILIRUBINUR Negative 12/25/2014 1404   KETONESUR NEGATIVE 04/17/2017 2102   PROTEINUR neg 06/26/2017 1558   PROTEINUR 30 (A) 04/17/2017 2102   UROBILINOGEN 0.2 06/26/2017 1558   NITRITE neg 06/26/2017 1558   NITRITE NEGATIVE 04/17/2017 2102   LEUKOCYTESUR Negative 06/26/2017 1558   LEUKOCYTESUR Negative 12/25/2014 1404     RADIOLOGY: Dg Chest 2 View  Result Date: 09/05/2017 CLINICAL DATA:  Initial evaluation for acute hypoxia, recent fall. EXAM: CHEST  2 VIEW COMPARISON:  Prior radiograph from 06/29/2017. FINDINGS: Mild cardiomegaly, stable. Mediastinal silhouette within normal limits. Lungs are hypoinflated. Chronic changes related to  COPD. Mild perihilar vascular congestion without overt pulmonary edema. Patchy left basilar opacity favored to reflect atelectasis, although infiltrate not entirely excluded. No obvious pleural effusion. No pneumothorax. No acute osseus abnormality. Mild wedging of the L1 vertebral body is grossly stable. IMPRESSION: 1. Shallow lung inflation with patchy left basilar opacity.  Atelectasis is favored, although infiltrate could be considered in the correct clinical setting. 2. Mild cardiomegaly with perihilar vascular congestion without overt pulmonary edema. 3. Underlying COPD. Electronically Signed   By: Rise Mu M.D.   On: 08/27/2017 18:12    EKG: Orders placed or performed during the hospital encounter of 09/10/2017  . ED EKG  . ED EKG    IMPRESSION AND PLAN: 1 acute sepsis Most likely secondary to SBP and possible left pneumonia Admit to telemetry floor bed on our sepsis protocol, empiric Rocephin for SBP, Rocephin/azithromycin for possible left pneumonia, follow-up on cultures  2 acute possible left sided pneumonia Empiric Rocephin/azithromycin and follow-up on cultures  3 acute SBP Empiric Rocephin as stated above, gastroenterology to see, and follow-up on paracentesis cultures  4 acute hepatic encephalopathy Secondary to alcoholic cirrhosis with associated ascites Lactulose 3 times daily, check ammonia level every a.m., neurochecks per routine  5 chronic alcoholic liver cirrhosis With noted associated chronic thrombocytopenia, esophageal varices, recurrent episodes of hepatic encephalopathy, electrolyte abnormalities, and ascites Increase nursing care as needed, aspiration/fall/seizure precautions, rifaximin  6 history of seizures Stable Continue Keppra  7 chronic schizophrenia Stable Continue home regiment  Full code Condition stable Prognosis fair DVT prophylaxis with SCDs Disposition home in 3-4 days barring any complications    All the records are reviewed and case discussed with ED provider. Management plans discussed with the patient, family and they are in agreement.  CODE STATUS: Code Status History    Date Active Date Inactive Code Status Order ID Comments User Context   08/19/2017 00:34 08/24/2017 15:43 Full Code [id number]  Audery Amel, MD Inpatient   07/09/2017 21:46 07/15/2017 18:27 Full Code [id number]   Audery Amel, MD Inpatient   06/29/2017 00:15 07/01/2017 16:18 Full Code [id number]  Oralia Manis, MD Inpatient   06/09/2017 11:43 06/16/2017 18:04 Full Code [id number]  Audery Amel, MD Inpatient   04/19/2017 16:08 04/20/2017 16:18 Full Code [id number]  Gracelyn Nurse, MD Inpatient   04/14/2017 19:47 04/16/2017 17:38 Full Code [id number]  Alford Highland, MD ED   04/06/2017 02:17 04/07/2017 16:14 Full Code [id number]  Oralia Manis, MD Inpatient   03/26/2017 08:25 03/27/2017 17:25 Full Code [id number]  Ihor Austin, MD Inpatient   01/18/2017 16:35 01/19/2017 15:16 Full Code [id number]  Marguarite Arbour, MD Inpatient   01/05/2017 14:57 01/05/2017 21:46 Full Code [id number]  Arnaldo Natal, MD ED   12/30/2016 02:51 01/01/2017 14:33 Full Code [id number]  Oralia Manis, MD Inpatient   12/18/2016 00:25 12/19/2016 21:28 Full Code [id number]  Katharina Caper, MD Inpatient   12/12/2016 23:40 12/15/2016 20:28 Full Code [id number]  Arnaldo Natal, MD Inpatient   11/24/2016 01:11 11/25/2016 16:42 Full Code [id number]  Tonye Royalty, DO Inpatient   11/12/2016 15:36 11/14/2016 14:36 Full Code [id number]  Auburn Bilberry, MD Inpatient   10/04/2016 18:04 10/08/2016 15:44 Full Code [id number]  Shaune Pollack, MD Inpatient   09/22/2016 02:29 09/23/2016 18:38 Full Code [id number]  Oralia Manis, MD Inpatient   08/07/2016 19:37 08/12/2016 20:51 Full Code [id number]  Gracelyn Nurse, MD Inpatient   07/07/2016 01:40 07/09/2016 18:29 Full Code [id number]  Ihor Austin, MD Inpatient   06/10/2016 06:21 06/11/2016  20:25 Full Code [id number]  Ihor Austin, MD Inpatient   05/12/2016 03:55 05/15/2016 14:57 Full Code [id number]  Ihor Austin, MD Inpatient   04/28/2016 02:54 04/29/2016 18:23 Full Code [id number]  Tonye Royalty, DO Inpatient   03/14/2016 12:30 03/16/2016 15:41 Full Code [id number]  Shaune Pollack, MD Inpatient   03/03/2016 12:13 03/06/2016 14:26 Full Code [id number]  Milagros Loll, MD ED   02/10/2016 17:54 02/12/2016 19:34 Full Code [id number]   Ramonita Lab, MD Inpatient   11/11/2015 05:16 11/13/2015 18:00 Full Code [id number]  Ihor Austin, MD Inpatient   11/07/2015 22:02 11/09/2015 16:56 Full Code [id number]  Enid Baas, MD Inpatient   10/28/2015 20:14 10/29/2015 14:26 Full Code [id number]  Altamese Dilling, MD Inpatient   10/16/2015 23:47 10/17/2015 18:00 Full Code [id number]  Oralia Manis, MD Inpatient   07/30/2015 17:35 08/04/2015 20:34 Full Code [id number]  Enedina Finner, MD Inpatient   07/05/2015 01:16 07/08/2015 19:22 Full Code [id number]  Oralia Manis, MD Inpatient   05/30/2015 19:28 06/01/2015 15:36 Full Code [id number]  [name], [name], MD Inpatient       TOTAL TIME TAKING CARE OF THIS PATIENT: 45 minutes.    Evelena Asa Salary M.D on 09/02/2017   Between 7am to 6pm - Pager - [phone]  After 6pm go to [email or web address] - password [email or web address] [facility]  [facility]  Office  [phone]  CC: Primary care physician; [name], NP   Note: This dictation was prepared with Dragon dictation along with smaller phrase technology. Any transcriptional errors that result from this process are unintentional.

## 2017-09-12 NOTE — ED Provider Notes (Signed)
[facility] Emergency Department Provider Note   ____________________________________________   I have reviewed the triage vital signs and the nursing notes.   HISTORY  Chief Complaint Back Pain   History limited by: Poor historian   HPI Howard [name] is a 36 y.o. male who presents to the emergency department today with concern for low back pain.   LOCATION:low back DURATION:6 days TIMING: constant SEVERITY: severe QUALITY: sharp CONTEXT: patient states that he fell six days ago. Since then has been having back pain. States he fell backwards. Denies hitting any furniture. Denies any other injury from the fall. MODIFYING FACTORS: none identified ASSOCIATED SYMPTOMS: has had some shortness of breath  Per medical record review patient has a history of alcoholic cirrhosis, multiple ED visits in past year from complications of cirrhosis.   Past Medical History:  Diagnosis Date  . Alcoholic cirrhosis of liver with ascites ([place])   . Alcoholism ([place])   . Anemia   . Atrial fibrillation ([place])   . COPD (chronic obstructive pulmonary disease) ([place])   . Depression   . Diabetes mellitus, type II ([place])   . Esophageal varices ([place])   . Heart disease    irregular heart beat (palpitations) and heart murmur  . Hyperlipemia   . Hypertension   . Liver disease   . Multiple thyroid nodules   . Portal hypertensive gastropathy ([place])   . Schizophrenia ([place])   . Seizures [facility])     Patient Active Problem List   Diagnosis Date Noted  . Schizophrenia, undifferentiated ([place]) 07/09/2017  . Schizophrenia, paranoid ([place]) 06/09/2017  . PTSD (post-traumatic stress disorder) 06/08/2017  . Inflamed external hemorrhoid 05/13/2017  . End stage liver disease ([place]) 04/24/2017  . Esophageal varices ([place])   . Hematemesis 04/05/2017  . Upper GI bleed   . Iron deficiency anemia due to chronic blood loss 02/16/2017  . Nausea 01/18/2017  . Respiratory failure with  hypoxia ([place]) 12/14/2016  . Seizures ([place]) 12/12/2016  . DNR (do not resuscitate) discussion 08/11/2016  . Muscle weakness (generalized)   . GI bleed 08/07/2016  . OSA (obstructive sleep apnea) 04/29/2016  . Anemia 04/29/2016  . Thrombocytopenia ([place]) 04/29/2016  . Coagulopathy ([place]) 04/29/2016  . Obesity 04/29/2016  . Controlled type 2 diabetes mellitus without complication ([place]) 47/42/5956  . Essential (primary) hypertension 12/11/2015  . Pure hypercholesterolemia 12/11/2015  . Hepatic encephalopathy ([place]) 10/16/2015  . Alcoholic cirrhosis of liver with ascites ([place]) 07/19/2015  . Elevated transaminase level 07/04/2015  . Alcohol use disorder, severe, dependence ([place]) 05/31/2015  . Paranoid schizophrenia ([place]) 03/20/2015    Past Surgical History:  Procedure Laterality Date  . ESOPHAGOGASTRODUODENOSCOPY N/A 10/05/2015   Procedure: ESOPHAGOGASTRODUODENOSCOPY (EGD);  Surgeon: Josefine Class, MD;  Location: [facility] ENDOSCOPY;  Service: Endoscopy;  Laterality: N/A;  . ESOPHAGOGASTRODUODENOSCOPY N/A 06/30/2017   Procedure: ESOPHAGOGASTRODUODENOSCOPY (EGD);  Surgeon: Lin Landsman, MD;  Location: [facility] ENDOSCOPY;  Service: Gastroenterology;  Laterality: N/A;  . ESOPHAGOGASTRODUODENOSCOPY (EGD) WITH PROPOFOL N/A 08/03/2015   Procedure: ESOPHAGOGASTRODUODENOSCOPY (EGD) WITH PROPOFOL;  Surgeon: Josefine Class, MD;  Location: [facility] ENDOSCOPY;  Service: Endoscopy;  Laterality: N/A;  . ESOPHAGOGASTRODUODENOSCOPY (EGD) WITH PROPOFOL N/A 08/31/2015   Procedure: ESOPHAGOGASTRODUODENOSCOPY (EGD) WITH PROPOFOL;  Surgeon: Josefine Class, MD;  Location: [facility] ENDOSCOPY;  Service: Endoscopy;  Laterality: N/A;  . ESOPHAGOGASTRODUODENOSCOPY (EGD) WITH PROPOFOL N/A 04/04/2016   Procedure: ESOPHAGOGASTRODUODENOSCOPY (EGD) WITH PROPOFOL;  Surgeon: Manya Silvas, MD;  Location: [facility] ENDOSCOPY;  Service: Endoscopy;  Laterality: N/A;  .  ESOPHAGOGASTRODUODENOSCOPY (EGD) WITH PROPOFOL N/A 11/13/2016    Procedure: ESOPHAGOGASTRODUODENOSCOPY (EGD) WITH PROPOFOL;  Surgeon: Jonathon Bellows, MD;  Location: [facility] ENDOSCOPY;  Service: Endoscopy;  Laterality: N/A;  . ESOPHAGOGASTRODUODENOSCOPY (EGD) WITH PROPOFOL N/A 04/07/2017   Procedure: ESOPHAGOGASTRODUODENOSCOPY (EGD) WITH PROPOFOL;  Surgeon: Lucilla Lame, MD;  Location: [facility] ENDOSCOPY;  Service: Endoscopy;  Laterality: N/A;  . NO PAST SURGERIES      Prior to Admission medications   Medication Sig Start Date End Date Taking? Authorizing Provider  albuterol (PROVENTIL HFA;VENTOLIN HFA) 108 (90 Base) MCG/ACT inhaler Inhale 2 puffs into the lungs every 4 (four) hours as needed for wheezing or shortness of breath. 06/16/17   [name], Madie Reno, MD  clozapine (CLOZARIL) 100 MG tablet Take 2 tablets (200 mg total) at bedtime by mouth. 08/04/17   [name], Madie Reno, MD  ferrous sulfate 325 (65 FE) MG tablet Take 1 tablet (325 mg total) by mouth daily with breakfast. 06/17/17   [name], Madie Reno, MD  folic acid (FOLVITE) 1 MG tablet Take 1 tablet (1 mg total) by mouth daily. 06/17/17   [name], Madie Reno, MD  gabapentin (NEURONTIN) 300 MG capsule Take 3 capsules (900 mg total) by mouth 3 (three) times daily. 08/24/17   Pucilowska, Herma Ard B, MD  hydrOXYzine (ATARAX/VISTARIL) 25 MG tablet Take 1 tablet (25 mg total) by mouth 3 (three) times daily as needed for anxiety. 06/16/17   [name], Madie Reno, MD  ipratropium-albuterol (DUONEB) 0.5-2.5 (3) MG/3ML SOLN Take 3 mLs by nebulization every 6 (six) hours. 07/01/17   Dustin Flock, MD  lactulose (CHRONULAC) 10 GM/15ML solution Take 67.5 mLs (45 g total) by mouth 3 (three) times daily. 06/16/17   [name], Madie Reno, MD  levETIRAcetam (KEPPRA) 750 MG tablet Take 2 tablets (1,500 mg total) by mouth 2 (two) times daily. 06/16/17   [name], Madie Reno, MD  lithium carbonate (LITHOBID) 300 MG CR tablet Take 1 tablet (300 mg total) at bedtime by mouth. 08/04/17   [name], Madie Reno, MD  LORazepam (ATIVAN) 0.5 MG tablet Take 0.5 mg by mouth every 4  (four) hours as needed for anxiety.    [provider]  losartan (COZAAR) 25 MG tablet Take 1 tablet (25 mg total) by mouth daily. 06/17/17   [name], Madie Reno, MD  metFORMIN (GLUCOPHAGE) 500 MG tablet Take 1 tablet (500 mg total) by mouth 2 (two) times daily with a meal. 06/16/17   [name], Madie Reno, MD  morphine 10 MG/5ML solution Take by mouth every 2 (two) hours as needed for severe pain.    [provider]  nadolol (CORGARD) 20 MG tablet Take 1 tablet (20 mg total) by mouth daily. 06/17/17   [name], Madie Reno, MD  pantoprazole (PROTONIX) 40 MG tablet Take 1 tablet (40 mg total) by mouth daily. 06/17/17   [name], Madie Reno, MD  rifaximin (XIFAXAN) 550 MG TABS tablet Take 1 tablet (550 mg total) by mouth 2 (two) times daily. 06/16/17   [name], Madie Reno, MD  sucralfate (CARAFATE) 1 g tablet Take 1 tablet (1 g total) by mouth 4 (four) times daily -  with meals and at bedtime. 06/16/17   [name], Madie Reno, MD  thiamine 100 MG tablet Take 1 tablet (100 mg total) by mouth daily. 06/17/17   [name], Madie Reno, MD  traZODone (DESYREL) 100 MG tablet Take 100 mg by mouth at bedtime as needed for sleep.    [provider]    Allergies Tramadol  Family History  Problem Relation Age of  Onset  . Heart disease Mother   . Hypertension Mother   . Hyperlipidemia Mother   . Stroke Father   . Heart attack Father   . Hypertension Father   . Heart disease Father   . Alcohol abuse Father   . Heart disease Brother     Social History Social History   Tobacco Use  . Smoking status: Never Smoker  . Smokeless tobacco: Never Used  Substance Use Topics  . Alcohol use: Yes    Alcohol/week: 0.0 oz    Comment: last drink 1 days ago (noted: 05/30/2017)  . Drug use: No    Review of Systems Constitutional: No fever/chills Eyes: No visual changes. ENT: No sore throat. Cardiovascular: Denies chest pain. Respiratory: Positive for shortness of breath. Gastrointestinal: Positive for abdominal  pain. Genitourinary: Negative for dysuria. Musculoskeletal: Positive for lower back pain. Skin: Negative for rash. Neurological: Negative for headaches, focal weakness or numbness.  ____________________________________________   PHYSICAL EXAM:  VITAL SIGNS: ED Triage Vitals  Enc Vitals Group     BP 08/26/2017 1702 115/75     Pulse Rate 08/27/2017 1702 (!) 120     Resp 09/04/2017 1702 20     Temp 08/31/2017 1702 98.2 F (36.8 C)     Temp Source 09/20/2017 1702 Oral     SpO2 08/22/2017 1702 (!) 88 %     Weight 09/09/2017 1704 (!) 340 lb (154.2 kg)     Height 08/22/2017 1704 6' 3"  (1.905 m)    Constitutional: Awake and alert. Poor historian. Slightly diaphoretic. Eyes: Conjunctivae are normal.  ENT   Head: Normocephalic and atraumatic.   Nose: No congestion/rhinnorhea.   Mouth/Throat: Mucous membranes are moist.   Neck: No stridor. Hematological/Lymphatic/Immunilogical: No cervical lymphadenopathy. Cardiovascular: Tachycardic, regular rhythm.  No murmurs, rubs, or gallops. Respiratory: Normal respiratory effort without tachypnea nor retractions. Breath sounds are clear and equal bilaterally. No wheezes/rales/rhonchi. Gastrointestinal: Distended. Diffusely tender to palpation. Genitourinary: Deferred Musculoskeletal: Normal range of motion in all extremities. No lower extremity edema. Neurologic:  Slight confusion. Moving all extremities. Skin:  Skin is warm, dry and intact. No rash noted.  ____________________________________________    LABS (pertinent positives/negatives)  Ammonia 78 CBC wbc 25.2, hgb 12.4, plt 135 BMP na 134, glu 127, 1.00 Ascitic fluid wbc 1,416, neutrophil % 76  ____________________________________________   EKG  I, Nance Pear, attending physician, personally viewed and interpreted this EKG  EKG Time: 1725 Rate: 118 Rhythm: sinus tachycardia Axis: left axis deviation Intervals: qtc 456 QRS: narrow ST changes: no st  elevation Impression: abnormal ekg   ____________________________________________    RADIOLOGY  CXR Patchy infiltrate left lower lung  ____________________________________________   PROCEDURES  .Paracentesis Date/Time: 09/20/2017 7:30 PM Performed by: Nance Pear, MD Authorized by: Nance Pear, MD   Consent:    Consent obtained:  Verbal   Consent given by:  Patient   Risks discussed:  Bleeding, bowel perforation, infection and pain Pre-procedure details:    Procedure purpose:  Diagnostic   Preparation: Patient was prepped and draped in usual sterile fashion   Anesthesia (see MAR for exact dosages):    Anesthesia method:  Local infiltration   Local anesthetic:  Lidocaine 1% w/o epi Procedure details:    Needle gauge:  18   Ultrasound guidance: yes     Puncture site:  L lower quadrant   Fluid removed amount:  29m   Fluid appearance:  Serosanguinous   Dressing: 2x2 sterile gauze. Post-procedure details:    Patient tolerance of procedure:  Tolerated well, no immediate complications   CRITICAL CARE Performed by: Nance Pear   Total critical care time: 35 minutes  Critical care time was exclusive of separately billable procedures and treating other patients.  Critical care was necessary to treat or prevent imminent or life-threatening deterioration.  Critical care was time spent personally by me on the following activities: development of treatment plan with patient and/or surrogate as well as nursing, discussions with consultants, evaluation of patient's response to treatment, examination of patient, obtaining history from patient or surrogate, ordering and performing treatments and interventions, ordering and review of laboratory studies, ordering and review of radiographic studies, pulse oximetry and re-evaluation of patient's condition.    ____________________________________________   INITIAL IMPRESSION / ASSESSMENT AND PLAN / ED  COURSE  Pertinent labs & imaging results that were available during my care of the patient were reviewed by me and considered in my medical decision making (see chart for details).  Patient presented to the emergency department today because of concerns for back pain.  I have met the patient many times and he did appear slightly worse than previous encounters.  He was hypoxic on initial vital signs and was tender to palpation diffusely throughout his abdomen.  He does have known history of cirrhosis and liver failure.  White count was significantly elevated at 25 raising concerns for sepsis.  Given the diffuse tenderness I did have concerns for SBP.  Diagnostic paracentesis was done which was consistent with SBP.  Patient was started on ceftriaxone.  Patient will be admitted to the hospitalist service. ________________________________________   FINAL CLINICAL IMPRESSION(S) / ED DIAGNOSES  Final diagnoses:  Low back pain, unspecified back pain laterality, unspecified chronicity, with sciatica presence unspecified  SBP (spontaneous bacterial peritonitis) ([place])  Sepsis, due to unspecified organism [facility])     Note: This dictation was prepared with Dragon dictation. Any transcriptional errors that result from this process are unintentional     Nance Pear, MD 09/13/17 [phone]

## 2017-09-13 ENCOUNTER — Inpatient Hospital Stay: Payer: Medicare Other

## 2017-09-13 DIAGNOSIS — G9340 Encephalopathy, unspecified: Secondary | ICD-10-CM

## 2017-09-13 DIAGNOSIS — K7290 Hepatic failure, unspecified without coma: Secondary | ICD-10-CM

## 2017-09-13 DIAGNOSIS — J9621 Acute and chronic respiratory failure with hypoxia: Secondary | ICD-10-CM

## 2017-09-13 LAB — COMPREHENSIVE METABOLIC PANEL
ALBUMIN: 2.3 g/dL — AB (ref 3.5–5.0)
ALT: 22 U/L (ref 17–63)
ALT: 25 U/L (ref 17–63)
ANION GAP: 4 — AB (ref 5–15)
ANION GAP: 8 (ref 5–15)
AST: 42 U/L — ABNORMAL HIGH (ref 15–41)
AST: 53 U/L — AB (ref 15–41)
Albumin: 2.2 g/dL — ABNORMAL LOW (ref 3.5–5.0)
Alkaline Phosphatase: 110 U/L (ref 38–126)
Alkaline Phosphatase: 90 U/L (ref 38–126)
BILIRUBIN TOTAL: 2 mg/dL — AB (ref 0.3–1.2)
BUN: 23 mg/dL — ABNORMAL HIGH (ref 6–20)
BUN: 24 mg/dL — AB (ref 6–20)
CHLORIDE: 95 mmol/L — AB (ref 101–111)
CHLORIDE: 98 mmol/L — AB (ref 101–111)
CO2: 33 mmol/L — AB (ref 22–32)
CO2: 35 mmol/L — ABNORMAL HIGH (ref 22–32)
Calcium: 7.5 mg/dL — ABNORMAL LOW (ref 8.9–10.3)
Calcium: 7.5 mg/dL — ABNORMAL LOW (ref 8.9–10.3)
Creatinine, Ser: 0.96 mg/dL (ref 0.61–1.24)
Creatinine, Ser: 0.96 mg/dL (ref 0.61–1.24)
GFR calc Af Amer: >60 mL/min (ref >60)
GFR calc non Af Amer: >60 mL/min (ref >60)
GLUCOSE: 117 mg/dL — AB (ref 65–99)
Glucose, Bld: 136 mg/dL — ABNORMAL HIGH (ref 65–99)
POTASSIUM: 2.9 mmol/L — AB (ref 3.5–5.1)
POTASSIUM: 3.4 mmol/L — AB (ref 3.5–5.1)
SODIUM: 136 mmol/L (ref 135–145)
Sodium: 137 mmol/L (ref 135–145)
TOTAL PROTEIN: 7.6 g/dL (ref 6.5–8.1)
Total Bilirubin: 2.4 mg/dL — ABNORMAL HIGH (ref 0.3–1.2)
Total Protein: 7.8 g/dL (ref 6.5–8.1)

## 2017-09-13 LAB — GLUCOSE, CAPILLARY
GLUCOSE-CAPILLARY: 117 mg/dL — AB (ref 65–99)
GLUCOSE-CAPILLARY: 109 mg/dL — AB (ref 65–99)
GLUCOSE-CAPILLARY: 81 mg/dL (ref 65–99)
GLUCOSE-CAPILLARY: 127 mg/dL — AB (ref 65–99)
GLUCOSE-CAPILLARY: 89 mg/dL (ref 65–99)
Glucose-Capillary: 110 mg/dL — ABNORMAL HIGH (ref 65–99)
Glucose-Capillary: 112 mg/dL — ABNORMAL HIGH (ref 65–99)
Glucose-Capillary: 134 mg/dL — ABNORMAL HIGH (ref 65–99)

## 2017-09-13 LAB — BLOOD GAS, ARTERIAL
ACID-BASE EXCESS: 13.4 mmol/L — AB (ref 0.0–2.0)
ACID-BASE EXCESS: 15 mmol/L — AB (ref 0.0–2.0)
BICARBONATE: 39.6 mmol/L — AB (ref 20.0–28.0)
BICARBONATE: 39.9 mmol/L — AB (ref 20.0–28.0)
Delivery systems: POSITIVE
EXPIRATORY PAP: 8
Expiratory PAP: 8
FIO2: 0.8
FIO2: 1
Inspiratory PAP: 18
Inspiratory PAP: 18
O2 SAT: 91 %
O2 SAT: 93.3 %
PATIENT TEMPERATURE: 37
PATIENT TEMPERATURE: 37
PCO2 ART: 57 mmHg — AB (ref 32.0–48.0)
PCO2 ART: 50 mmHg — AB (ref 32.0–48.0)
PH ART: 7.51 — AB (ref 7.350–7.450)
PO2 ART: 58 mmHg — AB (ref 83.0–108.0)
PO2 ART: 61 mmHg — AB (ref 83.0–108.0)
pH, Arterial: 7.45 (ref 7.350–7.450)

## 2017-09-13 LAB — CBC
HEMATOCRIT: 37.2 % — AB (ref 40.0–52.0)
HEMOGLOBIN: 11.6 g/dL — AB (ref 13.0–18.0)
MCH: 26.2 pg (ref 26.0–34.0)
MCHC: 31.1 g/dL — AB (ref 32.0–36.0)
MCV: 84.3 fL (ref 80.0–100.0)
Platelets: 126 10*3/uL — ABNORMAL LOW (ref 150–440)
RBC: 4.41 MIL/uL (ref 4.40–5.90)
RDW: 17.5 % — AB (ref 11.5–14.5)
WBC: 22.5 10*3/uL — ABNORMAL HIGH (ref 3.8–10.6)

## 2017-09-13 LAB — AMMONIA
AMMONIA: 68 umol/L — AB (ref 9–35)
Ammonia: 43 umol/L — ABNORMAL HIGH (ref 9–35)

## 2017-09-13 LAB — CBC WITH DIFFERENTIAL/PLATELET
Basophils Absolute: 0 10*3/uL (ref 0–0.1)
Basophils Relative: 0 %
EOS ABS: 0.1 10*3/uL (ref 0–0.7)
EOS PCT: 0 %
HCT: 36.3 % — ABNORMAL LOW (ref 40.0–52.0)
HEMOGLOBIN: 11.1 g/dL — AB (ref 13.0–18.0)
LYMPHS ABS: 1.2 10*3/uL (ref 1.0–3.6)
LYMPHS PCT: 7 %
MCH: 26.2 pg (ref 26.0–34.0)
MCHC: 30.7 g/dL — AB (ref 32.0–36.0)
MCV: 85.4 fL (ref 80.0–100.0)
MONOS PCT: 8 %
Monocytes Absolute: 1.3 10*3/uL — ABNORMAL HIGH (ref 0.2–1.0)
NEUTROS PCT: 85 %
Neutro Abs: 14.4 10*3/uL — ABNORMAL HIGH (ref 1.4–6.5)
Platelets: 109 10*3/uL — ABNORMAL LOW (ref 150–440)
RBC: 4.25 MIL/uL — AB (ref 4.40–5.90)
RDW: 17.5 % — ABNORMAL HIGH (ref 11.5–14.5)
WBC: 17 10*3/uL — AB (ref 3.8–10.6)

## 2017-09-13 LAB — MRSA PCR SCREENING: MRSA BY PCR: NEGATIVE

## 2017-09-13 LAB — LACTIC ACID, PLASMA: Lactic Acid, Venous: 1.8 mmol/L (ref 0.5–1.9)

## 2017-09-13 LAB — PROTIME-INR
INR: 1.81
Prothrombin Time: 20.8 seconds — ABNORMAL HIGH (ref 11.4–15.2)

## 2017-09-13 LAB — LITHIUM LEVEL: Lithium Lvl: 0.12 mmol/L — ABNORMAL LOW (ref 0.60–1.20)

## 2017-09-13 MED ORDER — ONDANSETRON HCL 4 MG PO TABS: 4.0000 mg | ORAL_TABLET | Freq: Four times a day (QID) | ORAL | Status: DC | PRN

## 2017-09-13 MED ORDER — FOLIC ACID 1 MG PO TABS: 1.0000 mg | ORAL_TABLET | Freq: Every day | ORAL | Status: DC

## 2017-09-13 MED ORDER — SODIUM CHLORIDE 0.9 % IV SOLN
0.4000 ug/kg/h | INTRAVENOUS | Status: DC
  Filled 2017-09-13: qty 2

## 2017-09-13 MED ORDER — THIAMINE HCL 100 MG/ML IJ SOLN: 100.0000 mg | Freq: Every day | INTRAMUSCULAR | Status: DC

## 2017-09-13 MED ORDER — LORAZEPAM 2 MG/ML IJ SOLN
1.0000 mg | Freq: Four times a day (QID) | INTRAMUSCULAR | Status: DC | PRN
  Administered 2017-09-13 – 2017-09-14 (×3): 1 mg via INTRAVENOUS
  Filled 2017-09-13 (×3): qty 1

## 2017-09-13 MED ORDER — ONDANSETRON HCL 4 MG/2ML IJ SOLN: 4.0000 mg | Freq: Four times a day (QID) | INTRAMUSCULAR | Status: DC | PRN

## 2017-09-13 MED ORDER — SODIUM CHLORIDE 0.9% FLUSH
3.0000 mL | Freq: Two times a day (BID) | INTRAVENOUS | Status: DC
  Administered 2017-09-13 – 2017-09-23 (×20): 3 mL via INTRAVENOUS

## 2017-09-13 MED ORDER — INSULIN ASPART 100 UNIT/ML ~~LOC~~ SOLN: 0.0000 [IU] | Freq: Every day | SUBCUTANEOUS | Status: DC

## 2017-09-13 MED ORDER — DEXMEDETOMIDINE HCL IN NACL 400 MCG/100ML IV SOLN
0.4000 ug/kg/h | INTRAVENOUS | Status: DC
  Administered 2017-09-13 – 2017-09-14 (×3): 0.4 ug/kg/h via INTRAVENOUS
  Filled 2017-09-13 (×3): qty 100

## 2017-09-13 MED ORDER — LOSARTAN POTASSIUM 25 MG PO TABS
25.0000 mg | ORAL_TABLET | Freq: Every day | ORAL | Status: DC
  Administered 2017-09-13 – 2017-09-22 (×10): 25 mg via ORAL
  Filled 2017-09-13 (×10): qty 1

## 2017-09-13 MED ORDER — ACETAMINOPHEN 650 MG RE SUPP: 650.0000 mg | Freq: Four times a day (QID) | RECTAL | Status: DC | PRN

## 2017-09-13 MED ORDER — DEXTROSE 50 % IV SOLN
50.0000 mL | Freq: Once | INTRAVENOUS | Status: AC
  Administered 2017-09-13: 50 mL via INTRAVENOUS

## 2017-09-13 MED ORDER — INSULIN ASPART 100 UNIT/ML ~~LOC~~ SOLN: 0.0000 [IU] | Freq: Three times a day (TID) | SUBCUTANEOUS | Status: DC

## 2017-09-13 MED ORDER — DEXTROSE 50 % IV SOLN
INTRAVENOUS | Status: AC
  Administered 2017-09-13: 50 mL via INTRAVENOUS
  Filled 2017-09-13: qty 50

## 2017-09-13 MED ORDER — LORAZEPAM 1 MG PO TABS: 1.0000 mg | ORAL_TABLET | Freq: Four times a day (QID) | ORAL | Status: DC | PRN

## 2017-09-13 MED ORDER — LITHIUM CARBONATE ER 300 MG PO TBCR
300.0000 mg | EXTENDED_RELEASE_TABLET | Freq: Every day | ORAL | Status: DC
  Administered 2017-09-13 – 2017-09-14 (×3): 300 mg via ORAL
  Filled 2017-09-13 (×3): qty 1

## 2017-09-13 MED ORDER — VITAMIN B-1 100 MG PO TABS: 100.0000 mg | ORAL_TABLET | Freq: Every day | ORAL | Status: DC

## 2017-09-13 MED ORDER — PANTOPRAZOLE SODIUM 40 MG PO TBEC
40.0000 mg | DELAYED_RELEASE_TABLET | Freq: Every day | ORAL | Status: DC
  Administered 2017-09-13 – 2017-09-14 (×2): 40 mg via ORAL
  Filled 2017-09-13 (×3): qty 1

## 2017-09-13 MED ORDER — VITAMIN K1 10 MG/ML IJ SOLN
10.0000 mg | Freq: Every day | INTRAMUSCULAR | Status: AC
  Administered 2017-09-13 – 2017-09-15 (×3): 10 mg via SUBCUTANEOUS
  Filled 2017-09-13 (×3): qty 1

## 2017-09-13 MED ORDER — ACETAMINOPHEN 325 MG PO TABS: 650.0000 mg | ORAL_TABLET | Freq: Four times a day (QID) | ORAL | Status: DC | PRN

## 2017-09-13 MED ORDER — POTASSIUM CHLORIDE 10 MEQ/100ML IV SOLN
10.0000 meq | INTRAVENOUS | Status: AC
  Administered 2017-09-13 (×3): 10 meq via INTRAVENOUS
  Filled 2017-09-13 (×3): qty 100

## 2017-09-13 MED ORDER — SUCRALFATE 1 G PO TABS
1.0000 g | ORAL_TABLET | Freq: Three times a day (TID) | ORAL | Status: DC
  Administered 2017-09-13 (×3): 1 g via ORAL
  Filled 2017-09-13 (×3): qty 1

## 2017-09-13 MED ORDER — LACTULOSE 10 GM/15ML PO SOLN
45.0000 g | Freq: Three times a day (TID) | ORAL | Status: DC
  Administered 2017-09-13 – 2017-09-14 (×7): 45 g via ORAL
  Filled 2017-09-13 (×7): qty 90

## 2017-09-13 MED ORDER — LEVETIRACETAM 750 MG PO TABS
1500.0000 mg | ORAL_TABLET | Freq: Two times a day (BID) | ORAL | Status: DC
  Administered 2017-09-13 – 2017-09-22 (×20): 1500 mg via ORAL
  Filled 2017-09-13 (×2): qty 2
  Filled 2017-09-13: qty 3
  Filled 2017-09-13 (×11): qty 2
  Filled 2017-09-13 (×2): qty 3
  Filled 2017-09-13: qty 2
  Filled 2017-09-13: qty 3
  Filled 2017-09-13 (×3): qty 2
  Filled 2017-09-13: qty 3

## 2017-09-13 MED ORDER — RIFAXIMIN 550 MG PO TABS
550.0000 mg | ORAL_TABLET | Freq: Two times a day (BID) | ORAL | Status: DC
  Administered 2017-09-13 – 2017-09-14 (×5): 550 mg via ORAL
  Filled 2017-09-13 (×6): qty 1

## 2017-09-13 MED ORDER — ADULT MULTIVITAMIN W/MINERALS CH: 1.0000 | ORAL_TABLET | Freq: Every day | ORAL | Status: DC

## 2017-09-13 MED ORDER — ACETAMINOPHEN 650 MG RE SUPP: 650.0000 mg | Freq: Three times a day (TID) | RECTAL | Status: DC | PRN

## 2017-09-13 MED ORDER — FERROUS SULFATE 325 (65 FE) MG PO TABS
325.0000 mg | ORAL_TABLET | Freq: Every day | ORAL | Status: DC
  Administered 2017-09-13 – 2017-09-23 (×10): 325 mg via ORAL
  Filled 2017-09-13 (×10): qty 1

## 2017-09-13 MED ORDER — SODIUM CHLORIDE 0.9 % IV SOLN
INTRAVENOUS | Status: DC
  Administered 2017-09-13: 01:00:00 via INTRAVENOUS

## 2017-09-13 MED ORDER — IPRATROPIUM-ALBUTEROL 0.5-2.5 (3) MG/3ML IN SOLN
3.0000 mL | Freq: Four times a day (QID) | RESPIRATORY_TRACT | Status: DC
  Administered 2017-09-13 – 2017-09-16 (×14): 3 mL via RESPIRATORY_TRACT
  Filled 2017-09-13 (×14): qty 3

## 2017-09-13 MED ORDER — FUROSEMIDE 10 MG/ML IJ SOLN
20.0000 mg | Freq: Once | INTRAMUSCULAR | Status: AC
  Administered 2017-09-13: 20 mg via INTRAVENOUS
  Filled 2017-09-13: qty 2

## 2017-09-13 MED ORDER — HYDROXYZINE HCL 25 MG PO TABS
25.0000 mg | ORAL_TABLET | Freq: Three times a day (TID) | ORAL | Status: DC | PRN
  Filled 2017-09-13: qty 1

## 2017-09-13 MED ORDER — ALBUTEROL SULFATE (2.5 MG/3ML) 0.083% IN NEBU: 2.5000 mg | INHALATION_SOLUTION | RESPIRATORY_TRACT | Status: DC | PRN

## 2017-09-13 MED ORDER — DEXTROSE 5 % IV SOLN: 2.0000 g | Freq: Two times a day (BID) | INTRAVENOUS | Status: DC

## 2017-09-13 MED ORDER — MORPHINE SULFATE 10 MG/5ML PO SOLN: 5.0000 mg | ORAL | Status: DC | PRN

## 2017-09-13 MED ORDER — NADOLOL 20 MG PO TABS
20.0000 mg | ORAL_TABLET | Freq: Every day | ORAL | Status: DC
  Administered 2017-09-13 – 2017-09-14 (×2): 20 mg via ORAL
  Filled 2017-09-13 (×3): qty 1

## 2017-09-13 MED ORDER — M.V.I. ADULT IV INJ
INJECTION | Freq: Once | INTRAVENOUS | Status: AC
  Administered 2017-09-13: 11:00:00 via INTRAVENOUS
  Filled 2017-09-13: qty 1000

## 2017-09-13 MED ORDER — CLOZAPINE 100 MG PO TABS
200.0000 mg | ORAL_TABLET | Freq: Every day | ORAL | Status: DC
  Administered 2017-09-13 – 2017-09-14 (×3): 200 mg via ORAL
  Filled 2017-09-13 (×3): qty 2

## 2017-09-13 NOTE — Progress Notes (Signed)
Awaiting medication from pharmacy. Pharmacy rep Dorathy DaftKayla states that it will be a minute before medication can be sent up. When asked a specific time one could not be given. Prime doc notified Dr.Diamond no new orders given.

## 2017-09-13 NOTE — Progress Notes (Signed)
Dr. Imogene Burnhen notified, pt O2 stats 81 on 2L [place]. Patient placed on non re breather. Respiratory called and admin breathing treatment for wheezing. Patient has been applied back on non re breather. Rapid response nurse Katie notified. Nursing and respiratory at bedside. Dr. Imogene Burnhen notified orders for transfer.

## 2017-09-13 NOTE — Progress Notes (Signed)
Clozapine monitoring:  Patient taking clozapine PTA. CBC with differential obtained on 12/23.   ANC 14.4 Labs entered into clozapine registry on 12/23 Ok to receive clozapine with weekly monitoring.  Will recheck CBC w/diff in one week.  Cindi CarbonMary M Danajah Birdsell, PharmD 09/13/17 12:01 PM

## 2017-09-13 NOTE — Progress Notes (Signed)
ICU unit called spoke with Florentina AddisonKatie which is rapid response nurse on patients current condition.

## 2017-09-13 NOTE — Consult Note (Addendum)
Wyline Mood , MD [address], [address], [place], [place], [address], [address], [place], [place], [address] Phone: [phone]  Fax: [phone]  Consultation  Referring Provider:    Dr Imogene Burn  Primary Care Physician:  [name], NP Primary Gastroenterologist:  [facility]          Reason for Consultation:     SBP  Date of Admission:  2017/09/27 Date of Consultation:  09/13/2017         HPI:   Howard [name] is a 36 y.o. male who has a past medical history of schizophrenia, alcohol abuse, seizures, alcoholic liver cirrhosis.  He has been hospitalized on a couple of occasions over the past 2 years.  He follows with [facility].  At his last visit in October 2018 he continued to drink alcohol and presented with hematemesis.  His last office note on 04/30/2017 suggested he was under hospice care.  Per last GI note he is not a transplant candidate.She underwent an upper endoscopy on 06/30/2017 and small esophageal varices with no stigmata of recent bleed were noted.  On this admission he was admitted on 07/13/2017 after a fall, acute back pain and was noted to be hypoxic with a working diagnosis of sepsis .  He underwent an abdominal paracentesis which confirms a diagnosis of spontaneous bacterial peritonitis.  Chest x-ray also was suggestive of possible pneumonia.  And hence has been admitted.   Patient very drowsy - unable to give any history  Past Medical History:  Diagnosis Date  . Alcoholic cirrhosis of liver with ascites (HCC)   . Alcoholism (HCC)   . Anemia   . Atrial fibrillation (HCC)   . COPD (chronic obstructive pulmonary disease) (HCC)   . Depression   . Diabetes mellitus, type II (HCC)   . Esophageal varices (HCC)   . Heart disease    irregular heart beat (palpitations) and heart murmur  . Hyperlipemia   . Hypertension   . Liver disease   . Multiple thyroid nodules   . Portal hypertensive gastropathy (HCC)   . Schizophrenia (HCC)     . Seizures (HCC)     Past Surgical History:  Procedure Laterality Date  . ESOPHAGOGASTRODUODENOSCOPY N/A 10/05/2015   Procedure: ESOPHAGOGASTRODUODENOSCOPY (EGD);  Surgeon: Elnita Maxwell, MD;  Location: [facility] ENDOSCOPY;  Service: Endoscopy;  Laterality: N/A;  . ESOPHAGOGASTRODUODENOSCOPY N/A 06/30/2017   Procedure: ESOPHAGOGASTRODUODENOSCOPY (EGD);  Surgeon: Toney Reil, MD;  Location: [facility] ENDOSCOPY;  Service: Gastroenterology;  Laterality: N/A;  . ESOPHAGOGASTRODUODENOSCOPY (EGD) WITH PROPOFOL N/A 08/03/2015   Procedure: ESOPHAGOGASTRODUODENOSCOPY (EGD) WITH PROPOFOL;  Surgeon: Elnita Maxwell, MD;  Location[facility] ENDOSCOPY;  Service: Endoscopy;  Laterality: N/A;  . ESOPHAGOGASTRODUODENOSCOPY (EGD) WITH PROPOFOL N/A 08/31/2015   Procedure: ESOPHAGOGASTRODUODENOSCOPY (EGD) WITH PROPOFOL;  Surgeon: Elnita Maxwell, MD;  Location: [facility] ENDOSCOPY;  Service: Endoscopy;  Laterality: N/A;  . ESOPHAGOGASTRODUODENOSCOPY (EGD) WITH PROPOFOL N/A 04/04/2016   Procedure: ESOPHAGOGASTRODUODENOSCOPY (EGD) WITH PROPOFOL;  Surgeon: Scot Jun, MD;  Location: [facility] ENDOSCOPY;  Service: Endoscopy;  Laterality: N/A;  . ESOPHAGOGASTRODUODENOSCOPY (EGD) WITH PROPOFOL N/A 11/13/2016   Procedure: ESOPHAGOGASTRODUODENOSCOPY (EGD) WITH PROPOFOL;  Surgeon: Wyline Mood, MD;  Location: [facility] ENDOSCOPY;  Service: Endoscopy;  Laterality: N/A;  . ESOPHAGOGASTRODUODENOSCOPY (EGD) WITH PROPOFOL N/A 04/07/2017   Procedure: ESOPHAGOGASTRODUODENOSCOPY (EGD) WITH PROPOFOL;  Surgeon: Midge Minium, MD;  Location: [facility] ENDOSCOPY;  Service: Endoscopy;  Laterality: N/A;  . NO PAST SURGERIES      Prior to Admission  medications   Medication Sig Start Date End Date Taking? Authorizing Provider  albuterol (PROVENTIL HFA;VENTOLIN HFA) 108 (90 Base) MCG/ACT inhaler Inhale 2 puffs into the lungs every 4 (four) hours as needed for wheezing or shortness of breath. 06/16/17   [name], [name], MD  clozapine (CLOZARIL) 100  MG tablet Take 2 tablets (200 mg total) at bedtime by mouth. 08/04/17   [name], [name], MD  ferrous sulfate 325 (65 FE) MG tablet Take 1 tablet (325 mg total) by mouth daily with breakfast. 06/17/17   [name], [name], MD  folic acid (FOLVITE) 1 MG tablet Take 1 tablet (1 mg total) by mouth daily. 06/17/17   [name], [name], MD  gabapentin (NEURONTIN) 300 MG capsule Take 3 capsules (900 mg total) by mouth 3 (three) times daily. 08/24/17   Pucilowska, Braulio Conte B, MD  hydrOXYzine (ATARAX/VISTARIL) 25 MG tablet Take 1 tablet (25 mg total) by mouth 3 (three) times daily as needed for anxiety. 06/16/17   [name], [name], MD  ipratropium-albuterol (DUONEB) 0.5-2.5 (3) MG/3ML SOLN Take 3 mLs by nebulization every 6 (six) hours. 07/01/17   Auburn Bilberry, MD  lactulose (CHRONULAC) 10 GM/15ML solution Take 67.5 mLs (45 g total) by mouth 3 (three) times daily. 06/16/17   [name], [name], MD  levETIRAcetam (KEPPRA) 750 MG tablet Take 2 tablets (1,500 mg total) by mouth 2 (two) times daily. 06/16/17   [name], [name], MD  lithium carbonate (LITHOBID) 300 MG CR tablet Take 1 tablet (300 mg total) at bedtime by mouth. 08/04/17   [name], [name], MD  LORazepam (ATIVAN) 0.5 MG tablet Take 0.5 mg by mouth every 4 (four) hours as needed for anxiety.    [provider]  losartan (COZAAR) 25 MG tablet Take 1 tablet (25 mg total) by mouth daily. 06/17/17   [name], [name], MD  metFORMIN (GLUCOPHAGE) 500 MG tablet Take 1 tablet (500 mg total) by mouth 2 (two) times daily with a meal. 06/16/17   [name], [name], MD  morphine 10 MG/5ML solution Take by mouth every 2 (two) hours as needed for severe pain.    [provider]  nadolol (CORGARD) 20 MG tablet Take 1 tablet (20 mg total) by mouth daily. 06/17/17   [name], [name], MD  pantoprazole (PROTONIX) 40 MG tablet Take 1 tablet (40 mg total) by mouth daily. 06/17/17   [name], [name], MD  rifaximin (XIFAXAN) 550 MG TABS tablet Take 1 tablet (550 mg total) by mouth 2  (two) times daily. 06/16/17   [name], [name], MD  sucralfate (CARAFATE) 1 g tablet Take 1 tablet (1 g total) by mouth 4 (four) times daily -  with meals and at bedtime. 06/16/17   [name], [name], MD  thiamine 100 MG tablet Take 1 tablet (100 mg total) by mouth daily. 06/17/17   [name], [name], MD  traZODone (DESYREL) 100 MG tablet Take 100 mg by mouth at bedtime as needed for sleep.    [provider]    Family History  Problem Relation Age of Onset  . Heart disease Mother   . Hypertension Mother   . Hyperlipidemia Mother   . Stroke Father   . Heart attack Father   . Hypertension Father   . Heart disease Father   . Alcohol abuse Father   . Heart disease Brother      Social History   Tobacco Use  . Smoking status: Never Smoker  . Smokeless tobacco: Never Used  Substance Use Topics  . Alcohol use: Yes    Alcohol/week: 0.0 oz    Comment: last drink 1 days ago (noted: 05/30/2017)  . Drug use: No    Allergies as of 09/04/2017 - Review Complete 09/06/2017  Allergen Reaction Noted  . Tramadol Itching 04/03/2017    Review of Systems:    Patient very drowsy unable to give any history    Physical Exam:  Vital signs in last 24 hours: Temp:  [98.2 F (36.8 C)-100.8 F (38.2 C)] 100.8 F (38.2 C) (12/23 0900) Pulse Rate:  [111-120] 111 (12/23 0900) Resp:  [20-34] 34 (12/23 0900) BP: (115-157)/(69-126) 150/69 (12/23 0900) SpO2:  [81 %-100 %] 95 % (12/23 0900) FiO2 (%):  [80 %] 80 % (12/23 0900) Weight:  [340 lb (154.2 kg)-346 lb 1.6 oz (157 kg)] 344 lb 2.2 oz (156.1 kg) (12/23 0815)   General:   Drowsy unarousable , poor oral hygiene Head:  Normocephalic and atraumatic. Eyes:   No icterus.   Conjunctiva pink. PERRLA. Ears:  cannot assess . Neck:  Supple; no masses or thyroidomegaly Lungs: Respirations even and unlabored. Lungs clear to auscultation bilaterally.   No wheezes, crackles, or rhonchi.  Heart:  Regular rate and rhythm;  Without murmur, clicks, rubs or  gallops Abdomen:  Soft, disetedned, nontender. Normal bowel sounds. No appreciable masses or hepatomegaly.  No rebound or guarding.  Neurologic:  Alert and oriented x0;  . Skin:  Intact without significant lesions or rashes. Cervical Nodes:  No significant cervical adenopathy. Psych: drowsy cannot assess   LAB RESULTS: Recent Labs    09/17/2017 1723 09/13/17 0018  WBC 25.2* 22.5*  HGB 12.4* 11.6*  HCT 39.9* 37.2*  PLT 135* 126*   BMET Recent Labs    08/23/2017 1723 09/13/17 0018  NA 134* 136  K 3.5 3.4*  CL 93* 95*  CO2 34* 33*  GLUCOSE 127* 117*  BUN 22* 24*  CREATININE 1.00 0.96  CALCIUM 7.8* 7.5*   LFT Recent Labs    09/11/2017 1723 09/13/17 0018  PROT 8.1 7.8  ALBUMIN 2.5* 2.3*  AST 58* 53*  ALT 27 25  ALKPHOS 109 110  BILITOT 2.4* 2.4*  BILIDIR 0.8*  --   IBILI 1.6*  --    PT/INR No results for input(s): LABPROT, INR in the last 72 hours.  STUDIES: Dg Chest 2 View  Result Date: 09/11/2017 CLINICAL DATA:  Initial evaluation for acute hypoxia, recent fall. EXAM: CHEST  2 VIEW COMPARISON:  Prior radiograph from 06/29/2017. FINDINGS: Mild cardiomegaly, stable. Mediastinal silhouette within normal limits. Lungs are hypoinflated. Chronic changes related to COPD. Mild perihilar vascular congestion without overt pulmonary edema. Patchy left basilar opacity favored to reflect atelectasis, although infiltrate not entirely excluded. No obvious pleural effusion. No pneumothorax. No acute osseus abnormality. Mild wedging of the L1 vertebral body is grossly stable. IMPRESSION: 1. Shallow lung inflation with patchy left basilar opacity. Atelectasis is favored, although infiltrate could be considered in the correct clinical setting. 2. Mild cardiomegaly with perihilar vascular congestion without overt pulmonary edema. 3. Underlying COPD. Electronically Signed   By: Rise MuBenjamin  McClintock M.D.   On: 09/07/2017 18:12      Impression / Plan:   Howard [name] is a 36 y.o. y/o  male with with a history of alcoholic liver cirrhosis decompensated.  Continues to drink alcohol.  Under hospice care per last GI note at the office on 05/11/2017.  He was admitted yesterday with a sepsis picture and carries a working  diagnosis of SBP and pneumonia.  Acetic fluid analysis shows a white cell count of 1400 with a neutrophil count of 76% which meets criteria for SBP. He is very drowsy which could be from hepatic encephelopatht   Plan  1.  Follow acetic fluid cultures  2.  Continue antibiotics for a period of 5 days and at the end of 5 days assess clinically if he does not have any abdominal pain fever can stop antibiotics and commence on antibiotics for lifetime prophylaxis against SBP either ciprofloxacin or Bactrim.  If clinically he still has abdominal pain fever and a repeat paracentesis and analysis of the fluid would be warranted. SBP carries a very high mortality rate and has very poor prognosis.  Monitor liver function tests/bmp  closely as it can very easily precipitate renal failure.  3.  Lactulose titrated to 2 soft bowel movements per day avoid diarrhea has diarrhea it by itself can precipitate encephalopathy due to dehydration.  Continue Xifaxan. 4.  Stop alcohol consumption. 5.  Involve hospice and palliative care due to poor long-term prognosis. 6. If drowsiness is no better - consider CT head to r/o subdural bleeds 7. IV thiamine to prevent wernicke's encephalopathy  8. INR 1.8 - may be from liver failure vs malnutrition- trial of vitamin K replacement.    Thank you for involving me in the care of this patient.      LOS: 1 day   Wyline MoodKiran Glendy Barsanti, MD  09/13/2017, 10:14 AM

## 2017-09-13 NOTE — Consult Note (Signed)
Name: Howard [name] MRN: [id number] DOB: 1981-06-04     CONSULTATION DATE: 03/17/2017 36 years old gentleman with history of alcohol abuse, alcoholic cirrhosis, anemia, atrial fibrillation, COPD, morbid obesity, diabetes mellitus, esophageal varices, hip arthropathy, hypertensive gastropathy, schizophrenia, seizure disorder.  The patient is admitted with sepsis more likely secondary to spontaneous bacterial peritonitis, left lower pneumonia and acute hypoxemic respiratory failure requiring non rebreather and FiO2 100%.  ICU transfer and critical care consult because of worsening respiratory status and altered mental status. All history was obtained from the primary care team electronic medical record.  Patient on arrival to the intensive care unit obtunded on nonrebreather with a snore breathing. Please refer to H&P for detailed medical history review of system.   PAST MEDICAL HISTORY :   has a past medical history of Alcoholic cirrhosis of liver with ascites (HCC), Alcoholism (HCC), Anemia, Atrial fibrillation (HCC), COPD (chronic obstructive pulmonary disease) (HCC), Depression, Diabetes mellitus, type II (HCC), Esophageal varices (HCC), Heart disease, Hyperlipemia, Hypertension, Liver disease, Multiple thyroid nodules, Portal hypertensive gastropathy (HCC), Schizophrenia (HCC), and Seizures (HCC).  has a past surgical history that includes No past surgeries; Esophagogastroduodenoscopy (egd) with propofol (N/A, 08/03/2015); Esophagogastroduodenoscopy (egd) with propofol (N/A, 08/31/2015); Esophagogastroduodenoscopy (N/A, 10/05/2015); Esophagogastroduodenoscopy (egd) with propofol (N/A, 04/04/2016); Esophagogastroduodenoscopy (egd) with propofol (N/A, 11/13/2016); Esophagogastroduodenoscopy (egd) with propofol (N/A, 04/07/2017); and Esophagogastroduodenoscopy (N/A, 06/30/2017). Prior to Admission medications   Medication Sig Start Date End Date Taking? Authorizing Provider  albuterol (PROVENTIL  HFA;VENTOLIN HFA) 108 (90 Base) MCG/ACT inhaler Inhale 2 puffs into the lungs every 4 (four) hours as needed for wheezing or shortness of breath. 06/16/17   [name], Jackquline DenmarkJohn T, MD  clozapine (CLOZARIL) 100 MG tablet Take 2 tablets (200 mg total) at bedtime by mouth. 08/04/17   [name], Jackquline DenmarkJohn T, MD  ferrous sulfate 325 (65 FE) MG tablet Take 1 tablet (325 mg total) by mouth daily with breakfast. 06/17/17   [name], Jackquline DenmarkJohn T, MD  folic acid (FOLVITE) 1 MG tablet Take 1 tablet (1 mg total) by mouth daily. 06/17/17   [name], Jackquline DenmarkJohn T, MD  gabapentin (NEURONTIN) 300 MG capsule Take 3 capsules (900 mg total) by mouth 3 (three) times daily. 08/24/17   Pucilowska, Braulio ConteJolanta B, MD  hydrOXYzine (ATARAX/VISTARIL) 25 MG tablet Take 1 tablet (25 mg total) by mouth 3 (three) times daily as needed for anxiety. 06/16/17   [name], Jackquline DenmarkJohn T, MD  ipratropium-albuterol (DUONEB) 0.5-2.5 (3) MG/3ML SOLN Take 3 mLs by nebulization every 6 (six) hours. 07/01/17   Auburn BilberryPatel, Shreyang, MD  lactulose (CHRONULAC) 10 GM/15ML solution Take 67.5 mLs (45 g total) by mouth 3 (three) times daily. 06/16/17   [name], Jackquline DenmarkJohn T, MD  levETIRAcetam (KEPPRA) 750 MG tablet Take 2 tablets (1,500 mg total) by mouth 2 (two) times daily. 06/16/17   [name], Jackquline DenmarkJohn T, MD  lithium carbonate (LITHOBID) 300 MG CR tablet Take 1 tablet (300 mg total) at bedtime by mouth. 08/04/17   [name], Jackquline DenmarkJohn T, MD  LORazepam (ATIVAN) 0.5 MG tablet Take 0.5 mg by mouth every 4 (four) hours as needed for anxiety.    [provider]  losartan (COZAAR) 25 MG tablet Take 1 tablet (25 mg total) by mouth daily. 06/17/17   [name], Jackquline DenmarkJohn T, MD  metFORMIN (GLUCOPHAGE) 500 MG tablet Take 1 tablet (500 mg total) by mouth 2 (two) times daily with a meal. 06/16/17   [name], Jackquline DenmarkJohn T, MD  morphine 10 MG/5ML solution Take by mouth every 2 (two) hours as needed for severe  pain.    [provider]  nadolol (CORGARD) 20 MG tablet Take 1 tablet (20 mg total) by mouth daily. 06/17/17   [name],  Jackquline DenmarkJohn T, MD  pantoprazole (PROTONIX) 40 MG tablet Take 1 tablet (40 mg total) by mouth daily. 06/17/17   [name], Jackquline DenmarkJohn T, MD  rifaximin (XIFAXAN) 550 MG TABS tablet Take 1 tablet (550 mg total) by mouth 2 (two) times daily. 06/16/17   [name], Jackquline DenmarkJohn T, MD  sucralfate (CARAFATE) 1 g tablet Take 1 tablet (1 g total) by mouth 4 (four) times daily -  with meals and at bedtime. 06/16/17   [name], Jackquline DenmarkJohn T, MD  thiamine 100 MG tablet Take 1 tablet (100 mg total) by mouth daily. 06/17/17   [name], Jackquline DenmarkJohn T, MD  traZODone (DESYREL) 100 MG tablet Take 100 mg by mouth at bedtime as needed for sleep.    [provider]   Allergies  Allergen Reactions  . Tramadol Itching    FAMILY HISTORY:  family history includes Alcohol abuse in his father; Heart attack in his father; Heart disease in his brother, father, and mother; Hyperlipidemia in his mother; Hypertension in his father and mother; Stroke in his father. SOCIAL HISTORY:  reports that  has never smoked. he has never used smokeless tobacco. He reports that he drinks alcohol. He reports that he does not use drugs.     VITAL SIGNS: Temp:  [98.2 F (36.8 C)-100.7 F (38.2 C)] 100.7 F (38.2 C) (12/23 0815) Pulse Rate:  [115-120] 118 (12/23 0815) Resp:  [20-31] 31 (12/23 0815) BP: (115-157)/(69-126) 156/69 (12/23 0607) SpO2:  [81 %-100 %] 95 % (12/23 0815) Weight:  [154.2 kg (340 lb)-157 kg (346 lb 1.6 oz)] 156.1 kg (344 lb 2.2 oz) (12/23 0815)  Physical Examination:  -Obtunded, moving all extremities, responds to verbal stimulation and following simple commands. -On non-rebreather, snore breathing pattern, air entry is equal bilaterally no adventitious sounds. -S1 and S2 are audible with no murmur. -Distended abdomen was moderate ascites febrile peristalsis. -Extremities are within normal was mildly peripheral edema.       Recent Labs  Lab 08/31/2017 1723 09/13/17 0018  NA 134* 136  K 3.5 3.4*  CL 93* 95*  CO2 34* 33*  BUN 22* 24*   CREATININE 1.00 0.96  GLUCOSE 127* 117*   Recent Labs  Lab 09/11/2017 1723 09/13/17 0018  HGB 12.4* 11.6*  HCT 39.9* 37.2*  WBC 25.2* 22.5*  PLT 135* 126*   Dg Chest 2 View  Result Date: 09/08/2017 CLINICAL DATA:  Initial evaluation for acute hypoxia, recent fall. EXAM: CHEST  2 VIEW COMPARISON:  Prior radiograph from 06/29/2017. FINDINGS: Mild cardiomegaly, stable. Mediastinal silhouette within normal limits. Lungs are hypoinflated. Chronic changes related to COPD. Mild perihilar vascular congestion without overt pulmonary edema. Patchy left basilar opacity favored to reflect atelectasis, although infiltrate not entirely excluded. No obvious pleural effusion. No pneumothorax. No acute osseus abnormality. Mild wedging of the L1 vertebral body is grossly stable. IMPRESSION: 1. Shallow lung inflation with patchy left basilar opacity. Atelectasis is favored, although infiltrate could be considered in the correct clinical setting. 2. Mild cardiomegaly with perihilar vascular congestion without overt pulmonary edema. 3. Underlying COPD. Electronically Signed   By: Rise MuBenjamin  McClintock M.D.   On: 09/08/2017 18:12    ASSESSMENT / PLAN: -Acute respiratory failure requiring nonrebreather 100%, snored respiration was body habitus just of of obstructive sleep apnea.  Start the patient on a BiPAP, monitor ABG, work of breathing, and consider intubation  if no improvement or worsening respiratory status.  -Altered mental status with toxic metabolic encephalopathy, Wernicke's encephalopathy, hyperammonemia with history of alcoholism, delirium tremens. Initially Precedex was ordered however because of concern of worsening respiratory status we will hold on using Precedex at this point.  Lactulose, monitor neuro status, consider CT head if no improvement to rule out ischemic event which is unlikely was nonfocal neurological exam.  -Spontaneous bacterial peritonitis.  Cloudy ascitic fluid sample was elevated  white cell count, culture still pending, empirically on Rocephin and rifaximin.  -Prerenal azotemia with intravascular volume depletion and hypoalbuminemia.  Optimize volume, monitor renal panel and urine output.  Follows renal ultrasound  -Elevated LFTs, hyperammonemia with history of alcoholic cirrhosis, hypertensive gastropathy and esophageal varices.  Lactulose, avoid hepatotoxic, monitor LFTs.  -Atelectasis and pneumonia (bibasilar airspace disease).  Empiric Rocephin, monitor chest x-ray + CBC + an FiO2 requirement.  -Pulmonary congestion.  Optimize volume, consider gentle diuresis to improve lung compliance, monitor intake / output and chest x-ray.  -Diabetes mellitus.  Optimize glycemic control.  -Anemia with history of hypertensive gastropathy and esophageal varices.  No obvious bleeding, however prerenal azotemia may suggest residual blood in the stomach.  Follow with a stool for occult blood, monitor H&H and maintain hemoglobin more than 7 g/dL.  -Thrombocytopenia with alcohol abuse and possible bone marrow suppression.  Monitor platelet counts.  -Low back pain with compression fracture of L1.  Optimize analgesia.  -COPD.  Bronchodilators.  -Seizure disorder.  Continue with antiepileptics.  -Schizophrenia.  On lithium, monitor lithium level.  -Hypokalemia.  Replete and monitor electrolytes.  -DVT and GI prophylaxis.  Continue with supportive care.  *Case was discussed with the primary care Critical care time 45 minutes

## 2017-09-13 NOTE — Progress Notes (Signed)
[facility] - [place] at [facility]   PATIENT NAME: Howard [name]    MR#:  [id number]  DATE OF BIRTH:  May 29, 1981  SUBJECTIVE:  CHIEF COMPLAINT:   Chief Complaint  Patient presents with  . Back Pain   The patient is found tachycardia, tachypnea, sweating and tremor. Hypoxia at 81%, put on NRB. REVIEW OF SYSTEMS:  Review of Systems  Unable to perform ROS: Mental status change    DRUG ALLERGIES:   Allergies  Allergen Reactions  . Tramadol Itching   VITALS:  Blood pressure (!) 156/69, pulse (!) 118, temperature (!) 100.7 F (38.2 C), temperature source Axillary, resp. rate (!) 31, height 6\' 2"  (1.88 m), weight (!) 344 lb 2.2 oz (156.1 kg), SpO2 95 %. PHYSICAL EXAMINATION:  Physical Exam  Constitutional: He is oriented to person, place, and time and well-developed, well-nourished, and in no distress.  Morbid obese  HENT:  Head: Normocephalic.  Mouth/Throat: Oropharynx is clear and moist.  Eyes: Conjunctivae and EOM are normal. Pupils are equal, round, and reactive to light. No scleral icterus.  Neck: Normal range of motion. Neck supple. No JVD present. No tracheal deviation present.  Cardiovascular: Normal rate, regular rhythm and normal heart sounds. Exam reveals no gallop.  No murmur heard. Pulmonary/Chest: Effort normal and breath sounds normal. No respiratory distress. He has no wheezes. He has no rales.  Abdominal: Soft. Bowel sounds are normal. He exhibits distension. There is tenderness. There is no rebound.  Musculoskeletal: Normal range of motion. He exhibits edema. He exhibits no tenderness.  Neurological: He is alert and oriented to person, place, and time. No cranial nerve deficit.  Skin: No rash noted. No erythema.  Psychiatric: Affect normal.   LABORATORY PANEL:  Male CBC Recent Labs  Lab 09/13/17 0018  WBC 22.5*  HGB 11.6*  HCT 37.2*  PLT 126*    ------------------------------------------------------------------------------------------------------------------ Chemistries  Recent Labs  Lab 09/13/17 0018  NA 136  K 3.4*  CL 95*  CO2 33*  GLUCOSE 117*  BUN 24*  CREATININE 0.96  CALCIUM 7.5*  AST 53*  ALT 25  ALKPHOS 110  BILITOT 2.4*   RADIOLOGY:  Dg Chest 2 View  Result Date: Feb 14, 2017 CLINICAL DATA:  Initial evaluation for acute hypoxia, recent fall. EXAM: CHEST  2 VIEW COMPARISON:  Prior radiograph from 06/29/2017. FINDINGS: Mild cardiomegaly, stable. Mediastinal silhouette within normal limits. Lungs are hypoinflated. Chronic changes related to COPD. Mild perihilar vascular congestion without overt pulmonary edema. Patchy left basilar opacity favored to reflect atelectasis, although infiltrate not entirely excluded. No obvious pleural effusion. No pneumothorax. No acute osseus abnormality. Mild wedging of the L1 vertebral body is grossly stable. IMPRESSION: 1. Shallow lung inflation with patchy left basilar opacity. Atelectasis is favored, although infiltrate could be considered in the correct clinical setting. 2. Mild cardiomegaly with perihilar vascular congestion without overt pulmonary edema. 3. Underlying COPD. Electronically Signed   By: Rise MuBenjamin  McClintock M.D.   On: Feb 14, 2017 18:12   ASSESSMENT AND PLAN:   * DT. The patient is transferred to stepdown unit.  CIWA protocol. precedex iv, banana bag iv.   * Acute respiratory failure with hypoxia.  Continue NRB, BIPAP prn, try  wean down to [place] O2, NEB.   1 acute sepsis Most likely secondary to SBP and possible left pneumonia Continue Rocephin for SBP, Rocephin/azithromycin for possible left pneumonia, follow-up CBC and cultures  2 acute possible left sided pneumonia Empiric Rocephin/azithromycin and follow-up cultures  3 acute SBP Empiric Rocephin as  stated above, follow-up GI and  paracentesis cultures  4 acute hepatic encephalopathy Secondary to  alcoholic cirrhosis with associated ascites Lactulose 3 times daily, check ammonia level every a.m., neurochecks per routine  5 chronic alcoholic liver cirrhosis With noted associated chronic thrombocytopenia, esophageal varices, recurrent episodes of hepatic encephalopathy, electrolyte abnormalities, and ascites  aspiration/fall/seizure precautions, rifaximin  6 history of seizures Stable Continue Keppra  7 chronic schizophrenia Stable Continue home regiment  I discussed with intensivitst, Dr. Samaan. All the records are reviewedDuanne [name] and case discussed with Care Management/Social Worker. Management plans discussed with the patient, family and they are in agreement.  CODE STATUS: Full Code  TOTAL CRITICAL TIME TAKING CARE OF THIS PATIENT: 45 minutes.   More than 50% of the time was spent in counseling/coordination of care: YES  POSSIBLE D/C IN 3 DAYS, DEPENDING ON CLINICAL CONDITION.   Howard [name] M.D on 09/13/2017 at 8:35 AM  Between 7am to 6pm - Pager - [phone]  After 6pm go to [email or web address] - Therapist, nutritionalpassword EPAS [facility]  [facility] [place] [facility]

## 2017-09-14 ENCOUNTER — Encounter: Payer: Self-pay | Admitting: Pulmonary Disease

## 2017-09-14 DIAGNOSIS — G9340 Encephalopathy, unspecified: Secondary | ICD-10-CM

## 2017-09-14 DIAGNOSIS — J9621 Acute and chronic respiratory failure with hypoxia: Secondary | ICD-10-CM

## 2017-09-14 DIAGNOSIS — J9622 Acute and chronic respiratory failure with hypercapnia: Secondary | ICD-10-CM

## 2017-09-14 LAB — LACTIC ACID, PLASMA
LACTIC ACID, VENOUS: 1.2 mmol/L (ref 0.5–1.9)
LACTIC ACID, VENOUS: 1.4 mmol/L (ref 0.5–1.9)

## 2017-09-14 LAB — BASIC METABOLIC PANEL
Anion gap: 5 (ref 5–15)
BUN: 23 mg/dL — AB (ref 6–20)
CO2: 34 mmol/L — ABNORMAL HIGH (ref 22–32)
CREATININE: 0.87 mg/dL (ref 0.61–1.24)
Calcium: 7.9 mg/dL — ABNORMAL LOW (ref 8.9–10.3)
Chloride: 100 mmol/L — ABNORMAL LOW (ref 101–111)
GFR calc Af Amer: >60 mL/min (ref >60)
Glucose, Bld: 102 mg/dL — ABNORMAL HIGH (ref 65–99)
Potassium: 3.3 mmol/L — ABNORMAL LOW (ref 3.5–5.1)
SODIUM: 139 mmol/L (ref 135–145)

## 2017-09-14 LAB — CBC WITH DIFFERENTIAL/PLATELET
BASOS ABS: 0 10*3/uL (ref 0–0.1)
Basophils Relative: 0 %
EOS ABS: 0.2 10*3/uL (ref 0–0.7)
EOS PCT: 1 %
HCT: 34 % — ABNORMAL LOW (ref 40.0–52.0)
HEMOGLOBIN: 10.4 g/dL — AB (ref 13.0–18.0)
Lymphocytes Relative: 8 %
Lymphs Abs: 1.1 10*3/uL (ref 1.0–3.6)
MCH: 26.4 pg (ref 26.0–34.0)
MCHC: 30.6 g/dL — ABNORMAL LOW (ref 32.0–36.0)
MCV: 86.1 fL (ref 80.0–100.0)
Monocytes Absolute: 1.1 10*3/uL — ABNORMAL HIGH (ref 0.2–1.0)
Monocytes Relative: 8 %
NEUTROS PCT: 83 %
Neutro Abs: 11 10*3/uL — ABNORMAL HIGH (ref 1.4–6.5)
PLATELETS: 104 10*3/uL — AB (ref 150–440)
RBC: 3.95 MIL/uL — AB (ref 4.40–5.90)
RDW: 17.8 % — ABNORMAL HIGH (ref 11.5–14.5)
WBC: 13.4 10*3/uL — AB (ref 3.8–10.6)

## 2017-09-14 LAB — GLUCOSE, CAPILLARY
GLUCOSE-CAPILLARY: 93 mg/dL (ref 65–99)
GLUCOSE-CAPILLARY: 120 mg/dL — AB (ref 65–99)
GLUCOSE-CAPILLARY: 133 mg/dL — AB (ref 65–99)
Glucose-Capillary: 81 mg/dL (ref 65–99)
Glucose-Capillary: 61 mg/dL — ABNORMAL LOW (ref 65–99)

## 2017-09-14 LAB — COMPREHENSIVE METABOLIC PANEL
ALBUMIN: 2 g/dL — AB (ref 3.5–5.0)
ALT: 20 U/L (ref 17–63)
ANION GAP: 3 — AB (ref 5–15)
AST: 36 U/L (ref 15–41)
Alkaline Phosphatase: 84 U/L (ref 38–126)
BILIRUBIN TOTAL: 1.9 mg/dL — AB (ref 0.3–1.2)
BUN: 22 mg/dL — ABNORMAL HIGH (ref 6–20)
CHLORIDE: 100 mmol/L — AB (ref 101–111)
CO2: 37 mmol/L — ABNORMAL HIGH (ref 22–32)
Calcium: 7.4 mg/dL — ABNORMAL LOW (ref 8.9–10.3)
Creatinine, Ser: 0.83 mg/dL (ref 0.61–1.24)
GFR calc Af Amer: >60 mL/min (ref >60)
GLUCOSE: 101 mg/dL — AB (ref 65–99)
POTASSIUM: 3 mmol/L — AB (ref 3.5–5.1)
Sodium: 140 mmol/L (ref 135–145)
TOTAL PROTEIN: 7.3 g/dL (ref 6.5–8.1)

## 2017-09-14 LAB — BRAIN NATRIURETIC PEPTIDE: B Natriuretic Peptide: 30 pg/mL (ref 0.0–100.0)

## 2017-09-14 LAB — PHOSPHORUS: PHOSPHORUS: 3.8 mg/dL (ref 2.5–4.6)

## 2017-09-14 LAB — AMMONIA: AMMONIA: 57 umol/L — AB (ref 9–35)

## 2017-09-14 LAB — MAGNESIUM: MAGNESIUM: 1.9 mg/dL (ref 1.7–2.4)

## 2017-09-14 MED ORDER — POTASSIUM CHLORIDE 10 MEQ/100ML IV SOLN
10.0000 meq | INTRAVENOUS | Status: DC
  Administered 2017-09-14 (×4): 10 meq via INTRAVENOUS
  Filled 2017-09-14 (×4): qty 100

## 2017-09-14 MED ORDER — ACETAZOLAMIDE SODIUM 500 MG IJ SOLR
500.0000 mg | Freq: Once | INTRAMUSCULAR | Status: AC
  Administered 2017-09-14: 500 mg via INTRAVENOUS
  Filled 2017-09-14: qty 500

## 2017-09-14 MED ORDER — INSULIN ASPART 100 UNIT/ML ~~LOC~~ SOLN: 0.0000 [IU] | SUBCUTANEOUS | Status: DC

## 2017-09-14 MED ORDER — POTASSIUM CHLORIDE 20 MEQ PO PACK
60.0000 meq | PACK | Freq: Once | ORAL | Status: AC
  Administered 2017-09-14: 60 meq via ORAL
  Filled 2017-09-14: qty 3

## 2017-09-14 MED ORDER — ORAL CARE MOUTH RINSE
15.0000 mL | Freq: Two times a day (BID) | OROMUCOSAL | Status: DC
  Administered 2017-09-14 – 2017-09-21 (×8): 15 mL via OROMUCOSAL

## 2017-09-14 MED ORDER — CHLORHEXIDINE GLUCONATE 0.12 % MT SOLN
15.0000 mL | Freq: Two times a day (BID) | OROMUCOSAL | Status: DC
  Administered 2017-09-14 – 2017-09-22 (×16): 15 mL via OROMUCOSAL
  Filled 2017-09-14 (×14): qty 15

## 2017-09-14 NOTE — Progress Notes (Signed)
Wyline Mood , MD [address], [place], [place], [address] [address], [address], [place], [place], [address] Phone: [phone]  Fax: [phone]   Erek Zavien Clubb is being followed for SBP  Day 1 of follow up   Subjective: Not responsive to verbal stimuli    Objective: Vital signs in last 24 hours: Vitals:   09/14/17 0300 09/14/17 0400 09/14/17 0500 09/14/17 0600  BP: 107/70 105/63 104/64 106/73  Pulse: 78 69 65 (!) 149  Resp: (!) 31 (!) 25 (!) 25 (!) 24  Temp:  99.5 F (37.5 C)    TempSrc:  Axillary    SpO2: 96% 93% 97% (!) 89%  Weight:      Height:       Weight change: 4 lb 2.2 oz (1.877 kg)  Intake/Output Summary (Last 24 hours) at 09/14/2017 1134 Last data filed at 09/14/2017 0749 Gross per 24 hour  Intake 80.08 ml  Output 1000 ml  Net -919.92 ml     Exam: Heart:: Regular rate and rhythm, S1S2 present or without murmur or extra heart sounds Lungs: clear to auscultation Abdomen: soft, nontender, normal bowel sounds   Lab Results: @LABTEST2 @ Micro Results: Recent Results (from the past 240 hour(s))  Blood Culture (routine x 2)     Status: None (Preliminary result)   Collection Time: 09/18/2017  5:21 PM  Result Value Ref Range Status   Specimen Description BLOOD RIGHT ANTECUBITAL  Final   Special Requests   Final    BOTTLES DRAWN AEROBIC AND ANAEROBIC Blood Culture adequate volume   Culture   Final    NO GROWTH 2 DAYS Performed at [facility], [address] [place], [place] [address]    Report Status PENDING  Incomplete  Blood culture (routine x 2)     Status: None (Preliminary result)   Collection Time: 09/21/2017  6:48 PM  Result Value Ref Range Status   Specimen Description BLOOD RIGHT HAND  Final   Special Requests   Final    BOTTLES DRAWN AEROBIC AND ANAEROBIC Blood Culture results may not be optimal due to an excessive volume of blood received in culture bottles   Culture   Final    NO GROWTH 2 DAYS Performed at  [facility], [address] [place], [place] [address]    Report Status PENDING  Incomplete  Body fluid culture     Status: None (Preliminary result)   Collection Time: 08/25/2017  7:16 PM  Result Value Ref Range Status   Specimen Description   Final    ASCITIC Performed at [facility], [address] [place], [place] [address]    Special Requests   Final    NONE Performed at [facility], [address], [place], [place] [address]    Gram Stain PENDING  Incomplete   Culture   Final    NO GROWTH 1 DAY Performed at [facility], [address] [place], [place] [address]    Report Status PENDING  Incomplete  Blood Culture (routine x 2)     Status: None (Preliminary result)   Collection Time: 09/01/2017  7:44 PM  Result Value Ref Range Status   Specimen Description BLOOD RIGHT ANTECUBITAL  Final   Special Requests   Final    BOTTLES DRAWN AEROBIC AND ANAEROBIC Blood Culture results may not be optimal due to an excessive volume of blood received in culture bottles   Culture   Final  NO GROWTH 2 DAYS Performed at [facility], [address] [place], [place] [address]    Report Status PENDING  Incomplete  MRSA PCR Screening     Status: None   Collection Time: 09/13/17  8:15 AM  Result Value Ref Range Status   MRSA by PCR NEGATIVE NEGATIVE Final    Comment:        The GeneXpert MRSA Assay (FDA approved for NASAL specimens only), is one component of a comprehensive MRSA colonization surveillance program. It is not intended to diagnose MRSA infection nor to guide or monitor treatment for MRSA infections. Performed at [facility], [address] [place], [place] [address]    Studies/Results: Dg Chest 2 View  Result Date: 08/29/2017 CLINICAL DATA:  Initial evaluation for acute hypoxia, recent fall. EXAM: CHEST  2 VIEW COMPARISON:  Prior radiograph from 06/29/2017. FINDINGS: Mild cardiomegaly, stable. Mediastinal  silhouette within normal limits. Lungs are hypoinflated. Chronic changes related to COPD. Mild perihilar vascular congestion without overt pulmonary edema. Patchy left basilar opacity favored to reflect atelectasis, although infiltrate not entirely excluded. No obvious pleural effusion. No pneumothorax. No acute osseus abnormality. Mild wedging of the L1 vertebral body is grossly stable. IMPRESSION: 1. Shallow lung inflation with patchy left basilar opacity. Atelectasis is favored, although infiltrate could be considered in the correct clinical setting. 2. Mild cardiomegaly with perihilar vascular congestion without overt pulmonary edema. 3. Underlying COPD. Electronically Signed   By: Rise MuBenjamin  McClintock M.D.   On: 09/02/2017 18:12   Dg Abd 1 View  Result Date: 09/13/2017 CLINICAL DATA:  Possible ileus by prior plain film. EXAM: ABDOMEN - 1 VIEW COMPARISON:  Single-view of the abdomen earlier today. FINDINGS: Bowel gas pattern is nonobstructive. Nondistended gaseous colon noted. Feeding tube is in place the tip in the distal stomach. IMPRESSION: Negative for ileus or obstruction. Electronically Signed   By: Drusilla Kannerhomas  Dalessio M.D.   On: 09/13/2017 12:29   Dg Abd 1 View  Result Date: 09/13/2017 CLINICAL DATA:  Feeding tube placement at bedside. EXAM: Portable ABDOMEN - 1 VIEW COMPARISON:  CT abdomen pelvis 04/21/2017 and earlier. Abdominal x-ray 08/03/2015. FINDINGS: Feeding tube tip at the level of the duodenal bulb, based on the anatomy on the prior coronal CT images. Mild gaseous distension of the visible loops of small bowel in the upper abdomen. Gas-filled transverse colon normal in caliber. IMPRESSION: 1. Feeding tube tip at the level of the duodenal bulb. 2. Distended loops of small bowel in the visualized upper abdomen, query ileus/enteritis or partial small bowel obstruction. Dedicated two-view abdominal x-rays may be helpful in further evaluation. Electronically Signed   By: Hulan Saashomas  Lawrence M.D.    On: 09/13/2017 11:40   [place] Renal  Result Date: 09/13/2017 CLINICAL DATA:  Diabetes mellitus. EXAM: RENAL / URINARY TRACT ULTRASOUND COMPLETE COMPARISON:  Ultrasound of June 30, 2017. FINDINGS: Right Kidney: Length: 12.9 cm. Echogenicity within normal limits. No mass or hydronephrosis visualized. Left Kidney: Length: 13 cm. Echogenicity within normal limits. No mass or hydronephrosis visualized. Bladder: Appears normal for degree of bladder distention. IMPRESSION: Normal renal ultrasound. Electronically Signed   By: Lupita RaiderJames  Green Jr, M.D.   On: 09/13/2017 10:29   Dg Chest Port 1 View  Result Date: 09/13/2017 CLINICAL DATA:  Unresponsive.  History of alcohol abuse, cirrhosis EXAM: PORTABLE CHEST 1 VIEW COMPARISON:  08/27/2017 FINDINGS: Diffuse bilateral mild interstitial thickening. Small bilateral pleural effusions. No pneumothorax. No focal consolidation. Mild bibasilar atelectasis. Stable cardiomegaly. No acute osseous abnormality. IMPRESSION:  Findings most concerning for mild pulmonary edema. Electronically Signed   By: Elige KoHetal  Patel   On: 09/13/2017 10:32   Medications: I have reviewed the patient's current medications. Scheduled Meds: . chlorhexidine  15 mL Mouth Rinse BID  . cloZAPine  200 mg Oral QHS  . ferrous sulfate  325 mg Oral Q breakfast  . insulin aspart  0-9 Units Subcutaneous Q4H  . ipratropium-albuterol  3 mL Nebulization Q6H  . lactulose  45 g Oral TID  . levETIRAcetam  1,500 mg Oral BID  . lithium carbonate  300 mg Oral QHS  . losartan  25 mg Oral Daily  . mouth rinse  15 mL Mouth Rinse q12n4p  . nadolol  20 mg Oral Daily  . pantoprazole  40 mg Oral Daily  . phytonadione  10 mg Subcutaneous Daily  . rifaximin  550 mg Oral BID  . sodium chloride flush  3 mL Intravenous Q12H   Continuous Infusions: . cefTRIAXone (ROCEPHIN)  IV 2 g (09/14/17 1048)   PRN Meds:.albuterol, ondansetron **OR** ondansetron (ZOFRAN) IV   Assessment: Active Problems:   SBP (spontaneous  bacterial peritonitis) (HCC)   Cort Kathreen Devoidrnold Dippolito is a 36 y.o. y/o male with with a history of alcoholic liver cirrhosis decompensated.  Continues to drink alcohol.  Under hospice care per last GI note at the office on 05/11/2017.  He was admitted 09/14/2017  with a sepsis picture and carries a working diagnosis of SBP and pneumonia.  Ascitic fluid analysis shows a white cell count of 1400 with a neutrophil count of 76% which meets criteria for SBP. He is very drowsy/unarousable  which could be from hepatic encephelopathy   Plan  1.  Follow acetic fluid cultures  2.  Continue antibiotics for a period of 5 days and at the end of 5 days assess clinically if he does not have any abdominal pain fever can stop antibiotics and commence on antibiotics for lifetime prophylaxis against SBP either ciprofloxacin or Bactrim.  If clinically he still has abdominal pain fever and a repeat paracentesis and analysis of the fluid would be warranted. SBP carries a very high mortality rate and has very poor prognosis.  Monitor liver function tests/bmp  closely as it can very easily precipitate renal failure.  3.  Lactulose titrated to 2 soft bowel movements per day avoid diarrhea has diarrhea it by itself can precipitate encephalopathy due to dehydration.  Continue Xifaxan. 4.  Stop alcohol consumption. 5.  Involve hospice and palliative care due to poor long-term prognosis. 6. If drowsiness is no better - consider CT head to r/o subdural bleeds 7. IV thiamine to prevent wernicke's encephalopathy  8. INR 1.8 - may be from liver failure vs malnutrition- trial of vitamin K replacement. Check INR tomorrow  9. Correct electrolytes to help reverse encephelopathy    LOS: 2 days   Wyline MoodKiran Arlissa Monteverde, MD 09/14/2017, 11:34 AM

## 2017-09-14 NOTE — Progress Notes (Signed)
[facility] -  at [facility]   PATIENT NAME: Howard BowlerJoey [name]    MR#:  [id number]  DATE OF BIRTH:  1981-06-11  SUBJECTIVE:  CHIEF COMPLAINT:   Chief Complaint  Patient presents with  . Back Pain   The patient is BiPAP and Precedex drip. REVIEW OF SYSTEMS:  Review of Systems  Unable to perform ROS: Mental status change    DRUG ALLERGIES:   Allergies  Allergen Reactions  . Tramadol Itching   VITALS:  Blood pressure 106/73, pulse 60, temperature 99.2 F (37.3 C), temperature source Axillary, resp. rate (!) 21, height 6\' 2"  (1.88 m), weight (!) 344 lb 2.2 oz (156.1 kg), SpO2 93 %. PHYSICAL EXAMINATION:  Physical Exam  Constitutional: He is oriented to person, place, and time and well-developed, well-nourished, and in no distress.  Morbid obese  HENT:  Head: Normocephalic.  Mouth/Throat: Oropharynx is clear and moist.  Eyes: Conjunctivae and EOM are normal. Pupils are equal, round, and reactive to light. No scleral icterus.  Neck: Normal range of motion. Neck supple. No JVD present. No tracheal deviation present.  Cardiovascular: Normal rate, regular rhythm and normal heart sounds. Exam reveals no gallop.  No murmur heard. Pulmonary/Chest: Effort normal and breath sounds normal. No respiratory distress. He has no wheezes. He has no rales.  Abdominal: Soft. Bowel sounds are normal. He exhibits distension. There is tenderness. There is no rebound.  Musculoskeletal: Normal range of motion. He exhibits edema. He exhibits no tenderness.  Neurological: He is alert and oriented to person, place, and time. No cranial nerve deficit.  Skin: No rash noted. No erythema.  Psychiatric: Affect normal.   LABORATORY PANEL:  Male CBC Recent Labs  Lab 09/14/17 0436  WBC 13.4*  HGB 10.4*  HCT 34.0*  PLT 104*   ------------------------------------------------------------------------------------------------------------------ Chemistries  Recent Labs  Lab  09/14/17 0436  NA 140  K 3.0*  CL 100*  CO2 37*  GLUCOSE 101*  BUN 22*  CREATININE 0.83  CALCIUM 7.4*  MG 1.9  AST 36  ALT 20  ALKPHOS 84  BILITOT 1.9*   RADIOLOGY:  No results found. ASSESSMENT AND PLAN:   * DT. CIWA protocol. precedex iv, banana bag iv.   * Acute respiratory failure with hypoxia.  Continue BIPAP prn, try  wean down to [place] O2, NEB.   1 acute sepsis Most likely secondary to SBP and possible left pneumonia Continue Rocephin for SBP for 5 days, Rocephin/azithromycin for possible left pneumonia, follow-up CBC and blood culture negative so far.  Leukocytosis is improving.  2 acute possible left sided pneumonia Empiric Rocephin/azithromycin and follow-up cultures: Negative so far.  3 acute SBP Empiric Rocephin as stated above, follow-up GI and  paracentesis cultures are negative so far.  4 acute hepatic encephalopathy Secondary to alcoholic cirrhosis with associated ascites Lactulose 3 times daily, ammonia 57.  5 chronic alcoholic liver cirrhosis With noted associated chronic thrombocytopenia, esophageal varices, recurrent episodes of hepatic encephalopathy, electrolyte abnormalities, and ascites  aspiration/fall/seizure precautions, rifaximin  6 history of seizures Stable Continue Keppra  7 chronic schizophrenia Stable Continue home regiment  Hypokalemia.  Given potassium supplement in the follow-up level.  All the records are reviewed and case discussed with Care Management/Social Worker. Management plans discussed with the patient, family and they are in agreement.  CODE STATUS: Full Code  TOTAL CRITICAL TIME TAKING CARE OF THIS PATIENT: 33 minutes.   More than 50% of the time was spent in counseling/coordination of care: YES  POSSIBLE  D/C IN 3 DAYS, DEPENDING ON CLINICAL CONDITION.   Shaune PollackQing Fama Muenchow M.D on 09/14/2017 at 1:31 PM  Between 7am to 6pm - Pager - [phone]  After 6pm go to [email or web address] - Museum/gallery curatorpassword EPAS [facility]  [facility] [place] [facility]

## 2017-09-14 NOTE — Care Management Note (Signed)
Case Management Note  Patient Details  Name: Renard MatterJoey Arnold Delamar MRN: [id number] Date of Birth: 1981/04/27  Subjective/Objective:                 Patient transferred to icu stepdown 12/23 due to withdrawal sx and need for precedex drip. Patient is also requiring continuous bipap. Nine Admissions within last 6 months, several of which are behavioral med admits.  May be of benefit to ask psych to consult for competency/capacity during this admission when medical condition stablizes   Action/Plan:   Expected Discharge Date:                  Expected Discharge Plan:     In-House Referral:     Discharge planning Services     Post Acute Care Choice:    Choice offered to:     DME Arranged:    DME Agency:     HH Arranged:    HH Agency:     Status of Service:     If discussed at [employer] Meetings, dates discussed:    Additional Comments:  Eber HongGreene, Sharol Croghan R, RN 09/14/2017, 9:47 AM

## 2017-09-14 NOTE — Progress Notes (Signed)
Somnolent. RASS -4, on dexmedetomidine. Not F/C On BiPAP  Vitals:   09/14/17 0300 09/14/17 0400 09/14/17 0500 09/14/17 0600  BP: 107/70 105/63 104/64 106/73  Pulse: 78 69 65 (!) 149  Resp: (!) 31 (!) 25 (!) 25 (!) 24  Temp:  99.5 F (37.5 C)    TempSrc:  Axillary    SpO2: 96% 93% 97% (!) 89%  Weight:      Height:        BiPAP 18/8, 100%  Somnolent, RASS -4, not F/C HEENT WNL JVP cannot be visualized  Chest is clear anteriorly Regular, no M Obese, NT, diminished bowel sounds Trace symmetric LE edema  BMET BMP Latest Ref Rng & Units 09/14/2017 09/13/2017 09/13/2017  Glucose 65 - 99 mg/dL 161(W101(H) 960(A136(H) 540(J117(H)  BUN 6 - 20 mg/dL 81(X22(H) 91(Y23(H) 78(G24(H)  Creatinine 0.61 - 1.24 mg/dL 9.560.83 2.130.96 0.860.96  Sodium 135 - 145 mmol/L 140 137 136  Potassium 3.5 - 5.1 mmol/L 3.0(L) 2.9(L) 3.4(L)  Chloride 101 - 111 mmol/L 100(L) 98(L) 95(L)  CO2 22 - 32 mmol/L 37(H) 35(H) 33(H)  Calcium 8.9 - 10.3 mg/dL 7.4(L) 7.5(L) 7.5(L)    CBC Latest Ref Rng & Units 09/14/2017 09/13/2017 09/13/2017  WBC 3.8 - 10.6 K/uL 13.4(H) 17.0(H) 22.5(H)  Hemoglobin 13.0 - 18.0 g/dL 10.4(L) 11.1(L) 11.6(L)  Hematocrit 40.0 - 52.0 % 34.0(L) 36.3(L) 37.2(L)  Platelets 150 - 440 K/uL 104(L) 109(L) 126(L)     CXR 12/23: low volumes, edema pattern  IMPRESSION: Acute (likely on chronic) hypoxemic/hypercarbic respiratory failure Pulmonary edema pattern on CXR Hypokalemia Metabolic alkalosis -likely due to loop diuresis Alcoholic liver disease Hyperammonemia Seizure disorder Schizophrenia Acute encephalopathy -likely TME Spontaneous bacterial peritonitis Type 2 diabetes Mild thrombocytopenia Never smoker - doubt significant COPD (though documented in records)  PLAN/REC: Continue BiPAP as needed Continue supplemental oxygen Correct electrolytes Acetazolamide x1 dose 12/24 Continue ceftriaxone Continue SSI Continue lactulose and rifaximin   Billy Fischeravid Simonds, MD PCCM service Mobile [phone] Pager  [phone] 09/14/2017 12:10 PM

## 2017-09-15 ENCOUNTER — Inpatient Hospital Stay: Payer: Medicare Other

## 2017-09-15 LAB — GLUCOSE, CAPILLARY
GLUCOSE-CAPILLARY: 79 mg/dL (ref 65–99)
GLUCOSE-CAPILLARY: 94 mg/dL (ref 65–99)
Glucose-Capillary: 91 mg/dL (ref 65–99)
Glucose-Capillary: 119 mg/dL — ABNORMAL HIGH (ref 65–99)
Glucose-Capillary: 83 mg/dL (ref 65–99)
Glucose-Capillary: 82 mg/dL (ref 65–99)
Glucose-Capillary: 68 mg/dL (ref 65–99)

## 2017-09-15 LAB — BASIC METABOLIC PANEL
Anion gap: 5 (ref 5–15)
BUN: 21 mg/dL — AB (ref 6–20)
CHLORIDE: 101 mmol/L (ref 101–111)
CO2: 30 mmol/L (ref 22–32)
CREATININE: 0.88 mg/dL (ref 0.61–1.24)
Calcium: 7.5 mg/dL — ABNORMAL LOW (ref 8.9–10.3)
GFR calc Af Amer: >60 mL/min (ref >60)
GFR calc non Af Amer: >60 mL/min (ref >60)
Glucose, Bld: 95 mg/dL (ref 65–99)
Potassium: 3.1 mmol/L — ABNORMAL LOW (ref 3.5–5.1)
Sodium: 136 mmol/L (ref 135–145)

## 2017-09-15 LAB — PROCALCITONIN: Procalcitonin: 0.2 ng/mL

## 2017-09-15 LAB — CBC
HCT: 33.8 % — ABNORMAL LOW (ref 40.0–52.0)
Hemoglobin: 10.4 g/dL — ABNORMAL LOW (ref 13.0–18.0)
MCH: 26.6 pg (ref 26.0–34.0)
MCHC: 30.8 g/dL — ABNORMAL LOW (ref 32.0–36.0)
MCV: 86.3 fL (ref 80.0–100.0)
PLATELETS: 98 10*3/uL — AB (ref 150–440)
RBC: 3.91 MIL/uL — AB (ref 4.40–5.90)
RDW: 18.1 % — AB (ref 11.5–14.5)
WBC: 12 10*3/uL — ABNORMAL HIGH (ref 3.8–10.6)

## 2017-09-15 LAB — HEPATIC FUNCTION PANEL
ALT: 18 U/L (ref 17–63)
AST: 37 U/L (ref 15–41)
Albumin: 2 g/dL — ABNORMAL LOW (ref 3.5–5.0)
Alkaline Phosphatase: 93 U/L (ref 38–126)
BILIRUBIN DIRECT: 0.4 mg/dL (ref 0.1–0.5)
BILIRUBIN INDIRECT: 0.7 mg/dL (ref 0.3–0.9)
Total Bilirubin: 1.1 mg/dL (ref 0.3–1.2)
Total Protein: 7.9 g/dL (ref 6.5–8.1)

## 2017-09-15 LAB — CALCIUM, IONIZED: Calcium, Ionized, Serum: 4.6 mg/dL (ref 4.5–5.6)

## 2017-09-15 MED ORDER — CLOZAPINE 100 MG PO TABS
200.0000 mg | ORAL_TABLET | Freq: Every day | ORAL | Status: DC
  Administered 2017-09-15 – 2017-09-16 (×2): 200 mg
  Filled 2017-09-15 (×2): qty 2

## 2017-09-15 MED ORDER — FUROSEMIDE 10 MG/ML IJ SOLN
40.0000 mg | Freq: Once | INTRAMUSCULAR | Status: AC
  Administered 2017-09-15: 40 mg via INTRAVENOUS
  Filled 2017-09-15: qty 4

## 2017-09-15 MED ORDER — KETOROLAC TROMETHAMINE 15 MG/ML IJ SOLN
15.0000 mg | Freq: Four times a day (QID) | INTRAMUSCULAR | Status: DC | PRN
  Administered 2017-09-15 – 2017-09-19 (×6): 15 mg via INTRAVENOUS
  Filled 2017-09-15 (×5): qty 1

## 2017-09-15 MED ORDER — LACTULOSE 10 GM/15ML PO SOLN
45.0000 g | Freq: Three times a day (TID) | ORAL | Status: DC
  Administered 2017-09-15 (×3): 45 g
  Filled 2017-09-15 (×2): qty 90

## 2017-09-15 MED ORDER — RIFAXIMIN 550 MG PO TABS
550.0000 mg | ORAL_TABLET | Freq: Two times a day (BID) | ORAL | Status: DC
  Administered 2017-09-15 (×2): 550 mg
  Filled 2017-09-15: qty 1

## 2017-09-15 MED ORDER — POTASSIUM CHLORIDE 20 MEQ/15ML (10%) PO SOLN
40.0000 meq | Freq: Three times a day (TID) | ORAL | Status: AC
  Administered 2017-09-15 (×3): 40 meq
  Filled 2017-09-15 (×3): qty 30

## 2017-09-15 NOTE — Progress Notes (Signed)
More responsive today.  Poorly oriented.  Exhibits poor judgment (requesting to leave AMA despite need for high flow oxygen).  Work of breathing is not excessive on HF [place]  Vitals:   09/15/17 1000 09/15/17 1008 09/15/17 1100 09/15/17 1200  BP: (!) 159/78  121/60 (!) 142/81  Pulse: 95  95 99  Resp: (!) 25  (!) 26 (!) 28  Temp:    98 F (36.7 C)  TempSrc:    Oral  SpO2: 96% (!) 89% (!) 89% 91%  Weight:      Height:        HFNC 60%  Somnolent, RASS 0, -1, not F/C HEENT WNL JVP cannot be visualized  Breath sounds diminished in right base, no wheezes Regular, no M Obese, NT, diminished bowel sounds Trace symmetric LE edema  BMET BMP Latest Ref Rng & Units 09/15/2017 09/14/2017 09/14/2017  Glucose 65 - 99 mg/dL 95 454(U102(H) 981(X101(H)  BUN 6 - 20 mg/dL 91(Y21(H) 78(G23(H) 95(A22(H)  Creatinine 0.61 - 1.24 mg/dL 2.130.88 0.860.87 5.780.83  Sodium 135 - 145 mmol/L 136 139 140  Potassium 3.5 - 5.1 mmol/L 3.1(L) 3.3(L) 3.0(L)  Chloride 101 - 111 mmol/L 101 100(L) 100(L)  CO2 22 - 32 mmol/L 30 34(H) 37(H)  Calcium 8.9 - 10.3 mg/dL 7.5(L) 7.9(L) 7.4(L)    CBC Latest Ref Rng & Units 09/15/2017 09/14/2017 09/13/2017  WBC 3.8 - 10.6 K/uL 12.0(H) 13.4(H) 17.0(H)  Hemoglobin 13.0 - 18.0 g/dL 10.4(L) 10.4(L) 11.1(L)  Hematocrit 40.0 - 52.0 % 33.8(L) 34.0(L) 36.3(L)  Platelets 150 - 440 K/uL 98(L) 104(L) 109(L)     CXR 12/25: Increased right pleural effusion  IMPRESSION: Acute (likely on chronic) hypoxemic/hypercarbic respiratory failure  Hypoxemia out of proportion to radiographic findings  Possible component of hepatopulmonary syndrome Moderate sized right pleural effusion Hypokalemia Metabolic alkalosis -much improved after acetazolamide Alcoholic cirrhosis Seizure disorder Schizophrenia Acute encephalopathy -likely TME/hepatic encephalopathy Spontaneous bacterial peritonitis Type 2 diabetes, controlled Mild thrombocytopenia  PLAN/REC: Continue BiPAP as needed Continue supplemental oxygen Continue to  correct electrolytes Continue ceftriaxone Continue SSI Continue lactulose and rifaximin Paracentesis requested of interventional radiology  This should help reduce the size of his right pleural effusion   He is asking to leave AMA.  I do not regard him as competent.  I will discuss this with him and his mother later in the day   Billy Fischeravid Tinaya Ceballos, MD PCCM service Mobile [phone] Pager [phone] 09/15/2017 1:07 PM

## 2017-09-15 NOTE — Progress Notes (Signed)
Spoke to Chesapeake EnergyKatie Page, on call RN for Hospice, regarding patient's status and plan of care.  She requests that social work call and update the on call RN for hospice when patient is transferred to floor and with any other significant plans.  Contact number is [phone].

## 2017-09-15 NOTE — Progress Notes (Signed)
[facility] - [place] at [facility]   PATIENT NAME: Howard [name]    MR#:  [id number]  DATE OF BIRTH:  10-07-1980  SUBJECTIVE:  CHIEF COMPLAINT:   Chief Complaint  Patient presents with  . Back Pain   The patient is off BiPAP and Precedex drip. On HF O2. But still confused. REVIEW OF SYSTEMS:  Review of Systems  Unable to perform ROS: Mental status change    DRUG ALLERGIES:   Allergies  Allergen Reactions  . Tramadol Itching   VITALS:  Blood pressure (!) 142/81, pulse 99, temperature 98 F (36.7 C), temperature source Oral, resp. rate (!) 28, height 6\' 2"  (1.88 m), weight (!) 344 lb 2.2 oz (156.1 kg), SpO2 91 %. PHYSICAL EXAMINATION:  Physical Exam  Constitutional: He is oriented to person, place, and time and well-developed, well-nourished, and in no distress.  Morbid obese  HENT:  Head: Normocephalic.  Mouth/Throat: Oropharynx is clear and moist.  Eyes: Conjunctivae and EOM are normal. Pupils are equal, round, and reactive to light. No scleral icterus.  Neck: Normal range of motion. Neck supple. No JVD present. No tracheal deviation present.  Cardiovascular: Normal rate, regular rhythm and normal heart sounds. Exam reveals no gallop.  No murmur heard. Pulmonary/Chest: Effort normal and breath sounds normal. No respiratory distress. He has no wheezes. He has no rales.  Abdominal: Soft. Bowel sounds are normal. He exhibits distension. There is tenderness. There is no rebound.  Musculoskeletal: Normal range of motion. He exhibits edema. He exhibits no tenderness.  Neurological: He is alert and oriented to person, place, and time. No cranial nerve deficit.  Skin: No rash noted. No erythema.  Psychiatric: Affect normal.   LABORATORY PANEL:  Male CBC Recent Labs  Lab 09/15/17 0511  WBC 12.0*  HGB 10.4*  HCT 33.8*  PLT 98*    ------------------------------------------------------------------------------------------------------------------ Chemistries  Recent Labs  Lab 09/14/17 0436  09/15/17 0511  NA 140   < > 136  K 3.0*   < > 3.1*  CL 100*   < > 101  CO2 37*   < > 30  GLUCOSE 101*   < > 95  BUN 22*   < > 21*  CREATININE 0.83   < > 0.88  CALCIUM 7.4*   < > 7.5*  MG 1.9  --   --   AST 36  --  37  ALT 20  --  18  ALKPHOS 84  --  93  BILITOT 1.9*  --  1.1   < > = values in this interval not displayed.   RADIOLOGY:  Dg Chest Port 1 View  Result Date: 09/15/2017 CLINICAL DATA:  Respiratory failure. EXAM: PORTABLE CHEST 1 VIEW COMPARISON:  09/13/2017. FINDINGS: The heart is enlarged. RIGHT pleural effusion is increased. There is mild vascular congestion, probably overall stable. A feeding tube has been placed but it is exact location is not established. IMPRESSION: Cardiomegaly persists.  Increasing RIGHT pleural effusion. Electronically Signed   By: Elsie StainJohn T Curnes M.D.   On: 09/15/2017 06:56   ASSESSMENT AND PLAN:   * DT. CIWA protocol. Off precedex iv, continue banana bag iv.   * Acute respiratory failure with hypoxia.  Off BIPAP prn, on HF,  try  wean down to [place] O2, NEB.   1 acute sepsis Most likely secondary to SBP and left pneumonia Continue Rocephin for SBP for 5 days, Rocephin/azithromycin for possible left pneumonia, follow-up CBC and blood culture negative so far.  Leukocytosis is  improving.  2 acute possible left sided pneumonia Empiric Rocephin/azithromycin and follow-up cultures: Negative so far.  3 acute SBP Empiric Rocephin as stated above, paracentesis cultures are negative so far.  4 acute hepatic encephalopathy Secondary to alcoholic cirrhosis with associated ascites Lactulose 3 times daily, ammonia 57.  5 chronic alcoholic liver cirrhosis With noted associated chronic thrombocytopenia, esophageal varices, recurrent episodes of hepatic encephalopathy, electrolyte  abnormalities, and ascites  aspiration/fall/seizure precautions, rifaximin  6 history of seizures Stable Continue Keppra  7 chronic schizophrenia Stable Continue home regiment  Hypokalemia.  Given potassium supplement and follow-up level.  Anemia of chronic disease.  Hemoglobin stable.  All the records are reviewed and case discussed with Care Management/Social Worker. Management plans discussed with the patient, family and they are in agreement.  CODE STATUS: Full Code  TOTAL CRITICAL TIME TAKING CARE OF THIS PATIENT: 33 minutes.   More than 50% of the time was spent in counseling/coordination of care: YES  POSSIBLE D/C IN 3 DAYS, DEPENDING ON CLINICAL CONDITION.   Shaune PollackQing Dartanian Knaggs M.D on 09/15/2017 at 12:46 PM  Between 7am to 6pm - Pager - [phone]  After 6pm go to [email or web address] - Therapist, nutritionalpassword EPAS [facility]  [facility]  Hospitalists

## 2017-09-15 NOTE — Progress Notes (Signed)
More confused than earlier in shift.  Pulled out dobhoff.  Pulling at telemetry leads.  Dobhoff remains out.  Will continue to monitor.

## 2017-09-16 ENCOUNTER — Inpatient Hospital Stay: Payer: Medicare Other

## 2017-09-16 DIAGNOSIS — J90 Pleural effusion, not elsewhere classified: Secondary | ICD-10-CM

## 2017-09-16 LAB — CBC
HEMATOCRIT: 34.8 % — AB (ref 40.0–52.0)
HEMOGLOBIN: 10.7 g/dL — AB (ref 13.0–18.0)
MCH: 26.3 pg (ref 26.0–34.0)
MCHC: 30.6 g/dL — ABNORMAL LOW (ref 32.0–36.0)
MCV: 85.9 fL (ref 80.0–100.0)
Platelets: 98 10*3/uL — ABNORMAL LOW (ref 150–440)
RBC: 4.06 MIL/uL — ABNORMAL LOW (ref 4.40–5.90)
RDW: 18.3 % — AB (ref 11.5–14.5)
WBC: 9.8 10*3/uL (ref 3.8–10.6)

## 2017-09-16 LAB — BASIC METABOLIC PANEL
ANION GAP: 6 (ref 5–15)
BUN: 16 mg/dL (ref 6–20)
CHLORIDE: 101 mmol/L (ref 101–111)
CO2: 28 mmol/L (ref 22–32)
Calcium: 7.5 mg/dL — ABNORMAL LOW (ref 8.9–10.3)
Creatinine, Ser: 0.78 mg/dL (ref 0.61–1.24)
GFR calc Af Amer: >60 mL/min (ref >60)
GFR calc non Af Amer: >60 mL/min (ref >60)
Glucose, Bld: 92 mg/dL (ref 65–99)
POTASSIUM: 3.5 mmol/L (ref 3.5–5.1)
SODIUM: 135 mmol/L (ref 135–145)

## 2017-09-16 LAB — GLUCOSE, CAPILLARY
GLUCOSE-CAPILLARY: 85 mg/dL (ref 65–99)
GLUCOSE-CAPILLARY: 91 mg/dL (ref 65–99)
GLUCOSE-CAPILLARY: 101 mg/dL — AB (ref 65–99)
Glucose-Capillary: 110 mg/dL — ABNORMAL HIGH (ref 65–99)
Glucose-Capillary: 78 mg/dL (ref 65–99)
Glucose-Capillary: 91 mg/dL (ref 65–99)

## 2017-09-16 LAB — BODY FLUID CULTURE: Culture: NO GROWTH

## 2017-09-16 LAB — HEPATIC FUNCTION PANEL
ALBUMIN: 2.1 g/dL — AB (ref 3.5–5.0)
ALK PHOS: 93 U/L (ref 38–126)
ALT: 20 U/L (ref 17–63)
AST: 43 U/L — AB (ref 15–41)
BILIRUBIN DIRECT: 0.4 mg/dL (ref 0.1–0.5)
BILIRUBIN TOTAL: 1.5 mg/dL — AB (ref 0.3–1.2)
Indirect Bilirubin: 1.1 mg/dL — ABNORMAL HIGH (ref 0.3–0.9)
Total Protein: 8.4 g/dL — ABNORMAL HIGH (ref 6.5–8.1)

## 2017-09-16 LAB — PROTIME-INR
INR: 1.73
Prothrombin Time: 20.1 seconds — ABNORMAL HIGH (ref 11.4–15.2)

## 2017-09-16 MED ORDER — RIFAXIMIN 550 MG PO TABS
550.0000 mg | ORAL_TABLET | Freq: Two times a day (BID) | ORAL | Status: DC
  Administered 2017-09-16 – 2017-09-23 (×14): 550 mg via ORAL
  Filled 2017-09-16 (×16): qty 1

## 2017-09-16 MED ORDER — FUROSEMIDE 20 MG PO TABS
40.0000 mg | ORAL_TABLET | Freq: Every day | ORAL | Status: DC
  Administered 2017-09-16 – 2017-09-22 (×7): 40 mg via ORAL
  Filled 2017-09-16: qty 1
  Filled 2017-09-16: qty 2
  Filled 2017-09-16: qty 1
  Filled 2017-09-16 (×4): qty 2

## 2017-09-16 MED ORDER — POTASSIUM CHLORIDE CRYS ER 20 MEQ PO TBCR
40.0000 meq | EXTENDED_RELEASE_TABLET | Freq: Two times a day (BID) | ORAL | Status: AC
  Administered 2017-09-16 – 2017-09-17 (×3): 40 meq via ORAL
  Filled 2017-09-16 (×3): qty 2

## 2017-09-16 MED ORDER — CLOZAPINE 100 MG PO TABS
200.0000 mg | ORAL_TABLET | Freq: Every day | ORAL | Status: DC
  Administered 2017-09-17: 200 mg via ORAL
  Filled 2017-09-16: qty 2

## 2017-09-16 MED ORDER — SPIRONOLACTONE 25 MG PO TABS
50.0000 mg | ORAL_TABLET | Freq: Two times a day (BID) | ORAL | Status: DC
  Administered 2017-09-16 – 2017-09-22 (×14): 50 mg via ORAL
  Filled 2017-09-16 (×14): qty 2

## 2017-09-16 MED ORDER — INSULIN ASPART 100 UNIT/ML ~~LOC~~ SOLN
0.0000 [IU] | Freq: Three times a day (TID) | SUBCUTANEOUS | Status: DC
  Administered 2017-09-21: 2 [IU] via SUBCUTANEOUS
  Filled 2017-09-16: qty 1

## 2017-09-16 MED ORDER — LACTULOSE 10 GM/15ML PO SOLN
30.0000 g | Freq: Three times a day (TID) | ORAL | Status: DC
  Administered 2017-09-16 – 2017-09-22 (×19): 30 g via ORAL
  Filled 2017-09-16 (×19): qty 60

## 2017-09-16 MED ORDER — IPRATROPIUM-ALBUTEROL 0.5-2.5 (3) MG/3ML IN SOLN
3.0000 mL | RESPIRATORY_TRACT | Status: DC | PRN
  Administered 2017-09-17: 3 mL via RESPIRATORY_TRACT
  Filled 2017-09-16: qty 3

## 2017-09-16 NOTE — Progress Notes (Addendum)
[facility] - [place] [facility]   PATIENT NAME: Howard [name]    MR#:  [id number]  DATE OF BIRTH:  Oct 29, 1980  SUBJECTIVE:  CHIEF COMPLAINT:   Chief Complaint  Patient presents with  . Back Pain   The patient is off BiPAP and Precedex drip. On HF O2, but hypoxia at 87%.  He is more awake. REVIEW OF SYSTEMS:  Review of Systems  Constitutional: Positive for malaise/fatigue. Negative for chills and fever.  HENT: Negative for sore throat.   Eyes: Negative for blurred vision and double vision.  Respiratory: Positive for shortness of breath. Negative for cough, hemoptysis, wheezing and stridor.   Cardiovascular: Negative for chest pain, palpitations, orthopnea and leg swelling.  Gastrointestinal: Negative for abdominal pain, blood in stool, melena, nausea and vomiting.  Genitourinary: Negative for dysuria, flank pain and hematuria.  Musculoskeletal: Negative for back pain and joint pain.  Neurological: Positive for weakness. Negative for dizziness, sensory change, focal weakness, seizures, loss of consciousness and headaches.  Endo/Heme/Allergies: Negative for polydipsia.  Psychiatric/Behavioral: Negative for depression. The patient is not nervous/anxious.     DRUG ALLERGIES:   Allergies  Allergen Reactions  . Tramadol Itching   VITALS:  Blood pressure (!) 156/74, pulse (!) 103, temperature 98.8 F (37.1 C), temperature source Oral, resp. rate (!) 34, height 6\' 2"  (1.88 m), weight (!) 344 lb 2.2 oz (156.1 kg), SpO2 94 %. PHYSICAL EXAMINATION:  Physical Exam  Constitutional: He is oriented to person, place, and time and well-developed, well-nourished, and in no distress.  Morbid obese  HENT:  Head: Normocephalic.  Mouth/Throat: Oropharynx is clear and moist.  Eyes: Conjunctivae and EOM are normal. Pupils are equal, round, and reactive to light. No scleral icterus.  Neck: Normal range of motion. Neck supple. No JVD present. No tracheal deviation present.    Cardiovascular: Normal rate, regular rhythm and normal heart sounds. Exam reveals no gallop.  No murmur heard. Pulmonary/Chest: Effort normal and breath sounds normal. No respiratory distress. He has no wheezes. He has no rales.  Abdominal: Soft. Bowel sounds are normal. He exhibits distension. There is no tenderness. There is no rebound.  Musculoskeletal: Normal range of motion. He exhibits no edema or tenderness.  Neurological: He is alert and oriented to person, place, and time. No cranial nerve deficit.  Skin: No rash noted. No erythema.  Psychiatric: Affect normal.   LABORATORY PANEL:  Male CBC Recent Labs  Lab 09/16/17 0525  WBC 9.8  HGB 10.7*  HCT 34.8*  PLT 98*   ------------------------------------------------------------------------------------------------------------------ Chemistries  Recent Labs  Lab 09/14/17 0436  09/16/17 0525  NA 140   < > 135  K 3.0*   < > 3.5  CL 100*   < > 101  CO2 37*   < > 28  GLUCOSE 101*   < > 92  BUN 22*   < > 16  CREATININE 0.83   < > 0.78  CALCIUM 7.4*   < > 7.5*  MG 1.9  --   --   AST 36   < > 43*  ALT 20   < > 20  ALKPHOS 84   < > 93  BILITOT 1.9*   < > 1.5*   < > = values in this interval not displayed.   RADIOLOGY:  [place] Paracentesis  Result Date: 09/16/2017 INDICATION: Cirrhosis and ascites. EXAM: ULTRASOUND GUIDED  PARACENTESIS MEDICATIONS: None. COMPLICATIONS: None immediate. PROCEDURE: Informed written consent was obtained from the patient after a discussion  of the risks, benefits and alternatives to treatment. A timeout was performed prior to the initiation of the procedure. Initial ultrasound was performed to localize ascites. The left lateral abdominal wall was prepped and draped in the usual sterile fashion. 1% lidocaine was used for local anesthesia. Following this, a 6 Fr Safe-T-Centesis catheter was introduced. An ultrasound image was saved for documentation purposes. The paracentesis was performed. The catheter  was removed and a dressing was applied. The patient tolerated the procedure well without immediate post procedural complication. FINDINGS: A total of approximately 1.3 L of amber color fluid was removed. IMPRESSION: Successful ultrasound-guided paracentesis yielding 1.3 liters of peritoneal fluid. Electronically Signed   By: Irish LackGlenn  Yamagata M.D.   On: 09/16/2017 12:41   Dg Chest Port 1 View  Result Date: 09/16/2017 CLINICAL DATA:  Respiratory failure EXAM: PORTABLE CHEST 1 VIEW COMPARISON:  September 15, 2017 FINDINGS: Feeding tube no longer apparent. There are pleural effusions bilaterally, larger on the right than left. There is airspace consolidation throughout portions of the right mid lower lung zones as well as in the left base, slightly increased from 1 day prior. There is cardiomegaly with pulmonary vascularity within normal limits. No adenopathy. No evident bone lesions. IMPRESSION: Cardiomegaly with pleural effusions bilaterally. Areas of consolidation bilaterally. Concern for multifocal pneumonia, although there may well be alveolar edema superimposed. Slight increase in opacity on the right is felt to be due to a slight increase in consolidation in the right mid and lower lung zones compared to 1 day prior. Electronically Signed   By: Bretta BangWilliam  Woodruff III M.D.   On: 09/16/2017 07:09   ASSESSMENT AND PLAN:   * DT.  Improving. CIWA protocol. Off precedex iv, continue banana bag iv.   * Acute respiratory failure with hypoxia.  Off BIPAP prn, on HF,  try  wean down to [place] O2, NEB.  Pleural effusion.  May need thoracentesis per Dr. Sung AmabileSimonds.   1 acute sepsis Most likely secondary to SBP and left pneumonia Complete Rocephin for SBP 5 days, Rocephin/azithromycin for possible left pneumonia, follow-up CBC and blood culture negative so far.  Leukocytosis is improved.  2 acute possible left sided pneumonia Empiric Rocephin/azithromycin and follow-up cultures: Negative so far.  3 acute  SBP Empiric Rocephin as stated above, paracentesis cultures are negative so far.  4 acute hepatic encephalopathy Secondary to alcoholic cirrhosis with associated ascites Lactulose 3 times daily.  Mental status is improving.  5 chronic alcoholic liver cirrhosis With noted associated chronic thrombocytopenia, esophageal varices, recurrent episodes of hepatic encephalopathy, electrolyte abnormalities, and ascites  aspiration/fall/seizure precautions, rifaximin  Ascites.  Paracentesis today: successful ultrasound-guided paracentesis yielding 1.3 liters of peritoneal fluid.  6 history of seizures Stable Continue Keppra  7 chronic schizophrenia Stable Continue home regiment  Hypokalemia.  Given potassium supplement and improved.  Anemia of chronic disease.  Hemoglobin stable.  Generalized weakness.  PT evaluation  when patient is stable.  Discussed with Dr. Sung AmabileSimonds. All the records are reviewed and case discussed with Care Management/Social Worker. Management plans discussed with the patient, family and they are in agreement.  CODE STATUS: Full Code  TOTAL CRITICAL TIME TAKING CARE OF THIS PATIENT: 36 minutes.   More than 50% of the time was spent in counseling/coordination of care: YES  POSSIBLE D/C IN 3 DAYS, DEPENDING ON CLINICAL CONDITION.   Shaune PollackQing Kasim Mccorkle M.D on 09/16/2017 at 2:45 PM  Between 7am to 6pm - Pager - [phone]  After 6pm go to [email or web address] - password [email or web address]  [facility]  [employer]

## 2017-09-16 NOTE — Progress Notes (Signed)
Alert, better oriented.  Still on HF [place] at 50%.  Denies pain and SOB. Paracentesis performed today -there was only 1.3 L removed and the verbal report to me is that there was no more ascites fluid to be obtained.  Vitals:   09/16/17 0900 09/16/17 1000 09/16/17 1100 09/16/17 1200  BP: (!) 159/79 (!) 158/74 (!) 145/63 (!) 152/77  Pulse: 99 (!) 101 (!) 101 (!) 102  Resp: (!) 36 (!) 31 (!) 33 (!) 34  Temp:      TempSrc:      SpO2: 92% 96% (!) 89% (!) 89%  Weight:      Height:        HFNC 50%  Somnolent, RASS 0, -1, not F/C HEENT WNL JVP cannot be visualized  Breath sounds diminished on right posteriorly, no wheezes Regular, no M Obese, NT, diminished bowel sounds Trace symmetric LE edema  BMET BMP Latest Ref Rng & Units 09/16/2017 09/15/2017 09/14/2017  Glucose 65 - 99 mg/dL 92 95 244(W102(H)  BUN 6 - 20 mg/dL 16 10(U21(H) 72(Z23(H)  Creatinine 0.61 - 1.24 mg/dL 3.660.78 4.400.88 3.470.87  Sodium 135 - 145 mmol/L 135 136 139  Potassium 3.5 - 5.1 mmol/L 3.5 3.1(L) 3.3(L)  Chloride 101 - 111 mmol/L 101 101 100(L)  CO2 22 - 32 mmol/L 28 30 34(H)  Calcium 8.9 - 10.3 mg/dL 7.5(L) 7.5(L) 7.9(L)    CBC Latest Ref Rng & Units 09/16/2017 09/15/2017 09/14/2017  WBC 3.8 - 10.6 K/uL 9.8 12.0(H) 13.4(H)  Hemoglobin 13.0 - 18.0 g/dL 10.7(L) 10.4(L) 10.4(L)  Hematocrit 40.0 - 52.0 % 34.8(L) 33.8(L) 34.0(L)  Platelets 150 - 440 K/uL 98(L) 98(L) 104(L)     CXR 12/26: Further increase in right pleural effusion.  Probable LLL effusion/atelectasis  IMPRESSION: Acute (likely on chronic) hypoxemic/hypercarbic respiratory failure Increasing right pleural effusion Hypokalemia Metabolic alkalosis -resolved after acetazolamide Alcoholic cirrhosis Seizure disorder Schizophrenia Acute encephalopathy - likely TME/hepatic encephalopathy Spontaneous bacterial peritonitis Type 2 diabetes, controlled Mild thrombocytopenia  PLAN/REC: Continue supplemental oxygen Continue to correct electrolytes Continue ceftriaxone.   Complete 5 days Continue SSI Continue lactulose and rifaximin Initiate oral. furosemide and spironolactone We might have to consider thoracentesis if pleural effusion does not improve with diuresis  He needs to remain in SDU until he is able to come off of HF [place]   Howard Fischeravid Simonds, MD [facility] Mobile [phone] Pager [phone] 09/16/2017 1:16 PM

## 2017-09-17 ENCOUNTER — Inpatient Hospital Stay: Payer: Medicare Other

## 2017-09-17 ENCOUNTER — Inpatient Hospital Stay (HOSPITAL_COMMUNITY): Payer: Self-pay

## 2017-09-17 LAB — COMPREHENSIVE METABOLIC PANEL
ALBUMIN: 1.9 g/dL — AB (ref 3.5–5.0)
ALK PHOS: 97 U/L (ref 38–126)
ALT: 20 U/L (ref 17–63)
AST: 44 U/L — AB (ref 15–41)
Anion gap: 3 — ABNORMAL LOW (ref 5–15)
BILIRUBIN TOTAL: 1.2 mg/dL (ref 0.3–1.2)
BUN: 13 mg/dL (ref 6–20)
CO2: 31 mmol/L (ref 22–32)
CREATININE: 0.8 mg/dL (ref 0.61–1.24)
Calcium: 7.7 mg/dL — ABNORMAL LOW (ref 8.9–10.3)
Chloride: 100 mmol/L — ABNORMAL LOW (ref 101–111)
GFR calc Af Amer: >60 mL/min (ref >60)
GLUCOSE: 133 mg/dL — AB (ref 65–99)
POTASSIUM: 4.2 mmol/L (ref 3.5–5.1)
Sodium: 134 mmol/L — ABNORMAL LOW (ref 135–145)
TOTAL PROTEIN: 8.5 g/dL — AB (ref 6.5–8.1)

## 2017-09-17 LAB — CULTURE, BLOOD (ROUTINE X 2)
CULTURE: NO GROWTH
CULTURE: NO GROWTH
CULTURE: NO GROWTH
Special Requests: ADEQUATE

## 2017-09-17 LAB — PTT FACTOR INHIBITOR (MIXING STUDY): APTT: 28 s (ref 22.9–30.2)

## 2017-09-17 LAB — GLUCOSE, CAPILLARY
GLUCOSE-CAPILLARY: 118 mg/dL — AB (ref 65–99)
GLUCOSE-CAPILLARY: 87 mg/dL (ref 65–99)
Glucose-Capillary: 86 mg/dL (ref 65–99)
Glucose-Capillary: 86 mg/dL (ref 65–99)
Glucose-Capillary: 83 mg/dL (ref 65–99)

## 2017-09-17 MED ORDER — POTASSIUM CHLORIDE CRYS ER 20 MEQ PO TBCR: 40.0000 meq | EXTENDED_RELEASE_TABLET | Freq: Every day | ORAL | Status: DC

## 2017-09-17 MED ORDER — GUAIFENESIN-DM 100-10 MG/5ML PO SYRP
5.0000 mL | ORAL_SOLUTION | ORAL | Status: DC | PRN
  Administered 2017-09-17: 5 mL via ORAL
  Filled 2017-09-17 (×2): qty 5

## 2017-09-17 MED ORDER — POTASSIUM CHLORIDE CRYS ER 20 MEQ PO TBCR: 40.0000 meq | EXTENDED_RELEASE_TABLET | Freq: Once | ORAL | Status: DC

## 2017-09-17 NOTE — Progress Notes (Signed)
[facility] -  [facility]   PATIENT NAME: Howard [name]    MR#:  [id number]  DATE OF BIRTH:  16-Jan-1981  SUBJECTIVE:  CHIEF COMPLAINT:   Chief Complaint  Patient presents with  . Back Pain   The patient is on BiPAP and drowsy. REVIEW OF SYSTEMS:  Review of Systems  Constitutional: Positive for malaise/fatigue. Negative for chills and fever.  HENT: Negative for sore throat.   Eyes: Negative for blurred vision and double vision.  Respiratory: Positive for shortness of breath. Negative for cough, hemoptysis, wheezing and stridor.   Cardiovascular: Negative for chest pain, palpitations, orthopnea and leg swelling.  Gastrointestinal: Negative for abdominal pain, blood in stool, melena, nausea and vomiting.  Genitourinary: Negative for dysuria, flank pain and hematuria.  Musculoskeletal: Negative for back pain and joint pain.  Neurological: Positive for weakness. Negative for dizziness, sensory change, focal weakness, seizures, loss of consciousness and headaches.  Endo/Heme/Allergies: Negative for polydipsia.  Psychiatric/Behavioral: Negative for depression. The patient is not nervous/anxious.     DRUG ALLERGIES:   Allergies  Allergen Reactions  . Tramadol Itching   VITALS:  Blood pressure (!) 155/62, pulse 95, temperature 98.6 F (37 C), temperature source Axillary, resp. rate (!) 32, height 6\' 2"  (1.88 m), weight (!) 344 lb 2.2 oz (156.1 kg), SpO2 96 %. PHYSICAL EXAMINATION:  Physical Exam  Constitutional: He is oriented to person, place, and time and well-developed, well-nourished, and in no distress.  Morbid obese  HENT:  Head: Normocephalic.  Mouth/Throat: Oropharynx is clear and moist.  Eyes: Conjunctivae and EOM are normal. Pupils are equal, round, and reactive to light. No scleral icterus.  Neck: Normal range of motion. Neck supple. No JVD present. No tracheal deviation present.  Cardiovascular: Normal rate, regular rhythm and normal  heart sounds. Exam reveals no gallop.  No murmur heard. Pulmonary/Chest: Effort normal and breath sounds normal. No respiratory distress. He has no wheezes. He has no rales.  Abdominal: Soft. Bowel sounds are normal. He exhibits distension. There is no tenderness. There is no rebound.  Musculoskeletal: Normal range of motion. He exhibits no edema or tenderness.  Neurological: He is alert and oriented to person, place, and time. No cranial nerve deficit.  Skin: No rash noted. No erythema.  Psychiatric: Affect normal.   LABORATORY PANEL:  Male CBC Recent Labs  Lab 09/16/17 0525  WBC 9.8  HGB 10.7*  HCT 34.8*  PLT 98*   ------------------------------------------------------------------------------------------------------------------ Chemistries  Recent Labs  Lab 09/14/17 0436  09/17/17 0945  NA 140   < > 134*  K 3.0*   < > 4.2  CL 100*   < > 100*  CO2 37*   < > 31  GLUCOSE 101*   < > 133*  BUN 22*   < > 13  CREATININE 0.83   < > 0.80  CALCIUM 7.4*   < > 7.7*  MG 1.9  --   --   AST 36   < > 44*  ALT 20   < > 20  ALKPHOS 84   < > 97  BILITOT 1.9*   < > 1.2   < > = values in this interval not displayed.   RADIOLOGY:  [place] Chest (pleural Effusion)  Result Date: 09/17/2017 CLINICAL DATA:  Evaluate for pleural effusion EXAM: CHEST ULTRASOUND COMPARISON:  None. FINDINGS: Trace pleural fluid is noted in the right and left chest. Thoracentesis was not performed. IMPRESSION: Trace pleural fluid bilaterally. Electronically Signed   By:  Jolaine ClickArthur  Hoss M.D.   On: 09/17/2017 13:45   ASSESSMENT AND PLAN:   * DT.  Improving. CIWA protocol. Off precedex iv, continue banana bag iv.   * Acute respiratory failure with hypoxia and hypercapnic.  On BIPAP and NEB.  Pleural effusion.  May need thoracentesis per Dr. Sung AmabileSimonds.   1 acute sepsis Most likely secondary to SBP and left pneumonia Completed Rocephin for SBP 5 days, Rocephin/azithromycin for possible left pneumonia, follow-up CBC  and blood culture negative so far.  Leukocytosis is improved.  2 acute possible left sided pneumonia Empiric Rocephin/azithromycin and follow-up cultures: Negative so far.  3 acute SBP Empiric Rocephin as stated above, paracentesis cultures are negative so far.  4 acute hepatic encephalopathy Secondary to alcoholic cirrhosis with associated ascites Lactulose 3 times daily.   5 chronic alcoholic liver cirrhosis With noted associated chronic thrombocytopenia, esophageal varices, recurrent episodes of hepatic encephalopathy, electrolyte abnormalities, and ascites  aspiration/fall/seizure precautions, rifaximin, continue spironolactone and Lasix.  Ascites.  Paracentesis today: successful ultrasound-guided paracentesis yielding 1.3 liters of peritoneal fluid.  Increased right-sided pleural effusion.  Thoracentesis today per Dr. Sung AmabileSimonds. US: Trace pleural fluid bilaterally.  6 history of seizures Stable Continue Keppra  7 chronic schizophrenia Stable Continue home regiment  Hypokalemia.  Given potassium supplement and improved.  Anemia of chronic disease.  Hemoglobin stable.  Generalized weakness.  PT evaluation  when patient is stable.  Discussed with Dr. Sung AmabileSimonds. All the records are reviewed and case discussed with Care Management/Social Worker. Management plans discussed with the patient, family and they are in agreement.  CODE STATUS: Full Code  TOTAL CRITICAL TIME TAKING CARE OF THIS PATIENT: 36 minutes.   More than 50% of the time was spent in counseling/coordination of care: YES  POSSIBLE D/C IN 3 DAYS, DEPENDING ON CLINICAL CONDITION.   Shaune PollackQing Kelin Borum M.D on 09/17/2017 at 4:27 PM  Between 7am to 6pm - Pager - [phone]  After 6pm go to [email or web address] - Therapist, nutritionalpassword EPAS [facility]  [facility] [place] Hospitalists

## 2017-09-17 NOTE — Progress Notes (Signed)
Patient easier to arouse later in shift, still alert and oriented. Patient wore bipap while sleeping and HFNC after waking up. Patient able to take evening medications without difficulty. Night shift RN given report.  Trudee KusterBrandi R Mansfield

## 2017-09-17 NOTE — Progress Notes (Signed)
Little change.  Still on HF [place] at 50 2-60 %.  Denies pain and SOB.   Vitals:   09/17/17 0723 09/17/17 0742 09/17/17 1100 09/17/17 1200  BP:   (!) 130/58 (!) 115/57  Pulse:   (!) 107 100  Resp:   (!) 32 (!) 22  Temp: 98.9 F (37.2 C)   98.6 F (37 C)  TempSrc: Oral   Axillary  SpO2:  94% 91% 90%  Weight:      Height:      HFNC 50%  Somnolent, RASS 0, -1, not F/C HEENT WNL JVP cannot be visualized  Breath sounds diminished on right posteriorly, no wheezes Regular, no M Obese, NT, diminished bowel sounds Trace symmetric LE edema  BMET BMP Latest Ref Rng & Units 09/17/2017 09/16/2017 09/15/2017  Glucose 65 - 99 mg/dL 161(W133(H) 92 95  BUN 6 - 20 mg/dL 13 16 96(E21(H)  Creatinine 0.61 - 1.24 mg/dL 4.540.80 0.980.78 1.190.88  Sodium 135 - 145 mmol/L 134(L) 135 136  Potassium 3.5 - 5.1 mmol/L 4.2 3.5 3.1(L)  Chloride 101 - 111 mmol/L 100(L) 101 101  CO2 22 - 32 mmol/L 31 28 30   Calcium 8.9 - 10.3 mg/dL 7.7(L) 7.5(L) 7.5(L)    CBC Latest Ref Rng & Units 09/16/2017 09/15/2017 09/14/2017  WBC 3.8 - 10.6 K/uL 9.8 12.0(H) 13.4(H)  Hemoglobin 13.0 - 18.0 g/dL 10.7(L) 10.4(L) 10.4(L)  Hematocrit 40.0 - 52.0 % 34.8(L) 33.8(L) 34.0(L)  Platelets 150 - 440 K/uL 98(L) 98(L) 104(L)     CXR: No new film  IMPRESSION: Acute (likely on chronic) hypoxemic/hypercarbic respiratory failure Increasing right pleural effusion Hypokalemia Mild hyponatremia Metabolic alkalosis  Alcoholic cirrhosis Seizure disorder Schizophrenia Acute encephalopathy - likely TME/hepatic encephalopathy Spontaneous bacterial peritonitis -course of antibiotics completed Type 2 diabetes, controlled Mild thrombocytopenia  PLAN/REC: Continue supplemental oxygen Continue to correct electrolytes as indicated 5-day course of ceftriaxone completed Continue SSI Continue lactulose and rifaximin Continue furosemide and spironolactone Thoracentesis ordered  He needs to remain in SDU until he is able to come off of HF [name]   Howard [name]  [name], MD PCCM service Mobile [phone] Pager [phone] 09/17/2017 2:05 PM

## 2017-09-17 NOTE — Progress Notes (Signed)
Increased flow to 55lpm on HFNC due pt SOB.

## 2017-09-17 NOTE — Progress Notes (Addendum)
Patient seemed to be more lethargic and having increased WOB. Dr. Sung AmabileSimonds notified, bipap order placed for patient to use while sleeping. No other orders at this time. Will continue to assess. Patient on HFNC while awake and bipap while sleeping.  Trudee KusterBrandi R [place]

## 2017-09-18 LAB — BASIC METABOLIC PANEL
Anion gap: 2 — ABNORMAL LOW (ref 5–15)
BUN: 18 mg/dL (ref 6–20)
CO2: 33 mmol/L — ABNORMAL HIGH (ref 22–32)
CREATININE: 0.88 mg/dL (ref 0.61–1.24)
Calcium: 7.7 mg/dL — ABNORMAL LOW (ref 8.9–10.3)
Chloride: 101 mmol/L (ref 101–111)
GFR calc Af Amer: >60 mL/min (ref >60)
GLUCOSE: 88 mg/dL (ref 65–99)
Potassium: 4.4 mmol/L (ref 3.5–5.1)
SODIUM: 136 mmol/L (ref 135–145)

## 2017-09-18 LAB — AMMONIA: AMMONIA: 75 umol/L — AB (ref 9–35)

## 2017-09-18 LAB — CBC
HCT: 32.6 % — ABNORMAL LOW (ref 40.0–52.0)
Hemoglobin: 10 g/dL — ABNORMAL LOW (ref 13.0–18.0)
MCH: 27 pg (ref 26.0–34.0)
MCHC: 30.6 g/dL — AB (ref 32.0–36.0)
MCV: 88.2 fL (ref 80.0–100.0)
PLATELETS: 96 10*3/uL — AB (ref 150–440)
RBC: 3.69 MIL/uL — ABNORMAL LOW (ref 4.40–5.90)
RDW: 18.2 % — AB (ref 11.5–14.5)
WBC: 10.3 10*3/uL (ref 3.8–10.6)

## 2017-09-18 LAB — GLUCOSE, CAPILLARY
GLUCOSE-CAPILLARY: 97 mg/dL (ref 65–99)
GLUCOSE-CAPILLARY: 83 mg/dL (ref 65–99)
Glucose-Capillary: 78 mg/dL (ref 65–99)
Glucose-Capillary: 111 mg/dL — ABNORMAL HIGH (ref 65–99)

## 2017-09-18 MED ORDER — CLOZAPINE 100 MG PO TABS
100.0000 mg | ORAL_TABLET | Freq: Every day | ORAL | Status: DC
Start: 2017-09-18 — End: 2017-09-22
  Administered 2017-09-18 – 2017-09-22 (×5): 100 mg via ORAL
  Filled 2017-09-18 (×6): qty 1

## 2017-09-18 NOTE — Progress Notes (Signed)
Transition from HF [place] to conventional nasal cannula and holding SPO2 above 90%.  Intermittently lethargic but oriented.  Denies pain and SOB.   Vitals:   09/18/17 0950 09/18/17 1000 09/18/17 1100 09/18/17 1200  BP: 134/61 (!) 152/69 (!) 158/64 (!) 143/70  Pulse: (!) 109 (!) 107 (!) 107 (!) 108  Resp: (!) 34 (!) 28 (!) 30 (!) 25  Temp:    100.3 F (37.9 C)  TempSrc:    Oral  SpO2: (!) 88% 96% 92% 93%  Weight:      Height:      [place] 4 LPM [place]  Somnolent, RASS 0, -1, + F/C HEENT WNL JVP cannot be visualized  Breath sounds diminished without adventitious sounds Regular, no M Obese, NT, diminished bowel sounds Trace symmetric LE edema  BMET BMP Latest Ref Rng & Units 09/18/2017 09/17/2017 09/16/2017  Glucose 65 - 99 mg/dL 88 161(W133(H) 92  BUN 6 - 20 mg/dL 18 13 16   Creatinine 0.61 - 1.24 mg/dL 9.600.88 4.540.80 0.980.78  Sodium 135 - 145 mmol/L 136 134(L) 135  Potassium 3.5 - 5.1 mmol/L 4.4 4.2 3.5  Chloride 101 - 111 mmol/L 101 100(L) 101  CO2 22 - 32 mmol/L 33(H) 31 28  Calcium 8.9 - 10.3 mg/dL 7.7(L) 7.7(L) 7.5(L)    CBC Latest Ref Rng & Units 09/18/2017 09/16/2017 09/15/2017  WBC 3.8 - 10.6 K/uL 10.3 9.8 12.0(H)  Hemoglobin 13.0 - 18.0 g/dL 10.0(L) 10.7(L) 10.4(L)  Hematocrit 40.0 - 52.0 % 32.6(L) 34.8(L) 33.8(L)  Platelets 150 - 440 K/uL 96(L) 98(L) 98(L)     CXR: No new film  IMPRESSION: Acute (likely on chronic) hypoxemic/hypercarbic respiratory failure Suspected right pleural effusion -not much fluid noted on ultrasound 12/27 Hypokalemia, resolved Mild hyponatremia, resolved Metabolic alkalosis, improved Alcoholic cirrhosis Seizure disorder Schizophrenia Acute encephalopathy, mild- likely TME/hepatic encephalopathy Spontaneous bacterial peritonitis -completed 5 days ceftriaxone Type 2 diabetes, controlled Mild thrombocytopenia  PLAN/REC: Continue supplemental oxygen Continue to correct electrolytes as indicated 5-day course of ceftriaxone completed Continue SSI Continue  lactulose and rifaximin Continue furosemide and spironolactone Recheck CXR 12/29 Mandatory nocturnal BiPAP ordered for likely OSA/OHS  Watch in SDU today.  If does well, might transfer to MedSurg 12/29   Billy Fischeravid Simonds, MD PCCM service Mobile [phone] Pager [phone] 09/18/2017 1:02 PM

## 2017-09-18 NOTE — Progress Notes (Addendum)
[facility] [place] [facility]   PATIENT NAME: Howard BowlerJoey Lubas    MR#:  [id number]  DATE OF BIRTH:  June 19, 1981  SUBJECTIVE:  CHIEF COMPLAINT:   Chief Complaint  Patient presents with  . Back Pain   The patient is awake, on O2 [place] 4 L, off BiPAP. REVIEW OF SYSTEMS:  Review of Systems  Constitutional: Positive for malaise/fatigue. Negative for chills and fever.  HENT: Negative for sore throat.   Eyes: Negative for blurred vision and double vision.  Respiratory: Positive for shortness of breath. Negative for cough, hemoptysis, wheezing and stridor.   Cardiovascular: Negative for chest pain, palpitations, orthopnea and leg swelling.  Gastrointestinal: Negative for abdominal pain, blood in stool, melena, nausea and vomiting.  Genitourinary: Negative for dysuria, flank pain and hematuria.  Musculoskeletal: Negative for back pain and joint pain.  Neurological: Positive for weakness. Negative for dizziness, sensory change, focal weakness, seizures, loss of consciousness and headaches.  Endo/Heme/Allergies: Negative for polydipsia.  Psychiatric/Behavioral: Negative for depression. The patient is not nervous/anxious.     DRUG ALLERGIES:   Allergies  Allergen Reactions  . Tramadol Itching   VITALS:  Blood pressure (!) 143/70, pulse (!) 108, temperature 100.3 F (37.9 C), temperature source Oral, resp. rate (!) 25, height 6\' 2"  (1.88 m), weight (!) 344 lb 2.2 oz (156.1 kg), SpO2 93 %. PHYSICAL EXAMINATION:  Physical Exam  Constitutional: He is oriented to person, place, and time and well-developed, well-nourished, and in no distress.  Morbid obese  HENT:  Head: Normocephalic.  Mouth/Throat: Oropharynx is clear and moist.  Eyes: Conjunctivae and EOM are normal. Pupils are equal, round, and reactive to light. No scleral icterus.  Neck: Normal range of motion. Neck supple. No JVD present. No tracheal deviation present.  Cardiovascular: Normal rate, regular rhythm  and normal heart sounds. Exam reveals no gallop.  No murmur heard. Pulmonary/Chest: Effort normal and breath sounds normal. No respiratory distress. He has no wheezes. He has no rales.  Abdominal: Soft. Bowel sounds are normal. He exhibits distension. There is no tenderness. There is no rebound.  Musculoskeletal: Normal range of motion. He exhibits no edema or tenderness.  Neurological: He is alert and oriented to person, place, and time. No cranial nerve deficit.  Skin: No rash noted. No erythema.  Psychiatric: Affect normal.   LABORATORY PANEL:  Male CBC Recent Labs  Lab 09/18/17 0625  WBC 10.3  HGB 10.0*  HCT 32.6*  PLT 96*   ------------------------------------------------------------------------------------------------------------------ Chemistries  Recent Labs  Lab 09/14/17 0436  09/17/17 0945 09/18/17 0625  NA 140   < > 134* 136  K 3.0*   < > 4.2 4.4  CL 100*   < > 100* 101  CO2 37*   < > 31 33*  GLUCOSE 101*   < > 133* 88  BUN 22*   < > 13 18  CREATININE 0.83   < > 0.80 0.88  CALCIUM 7.4*   < > 7.7* 7.7*  MG 1.9  --   --   --   AST 36   < > 44*  --   ALT 20   < > 20  --   ALKPHOS 84   < > 97  --   BILITOT 1.9*   < > 1.2  --    < > = values in this interval not displayed.   RADIOLOGY:  No results found. ASSESSMENT AND PLAN:   * DT.  Improved. CIWA protocol. Off precedex iv,  off banana bag iv.   * Acute respiratory failure with hypoxia and hypercapnic.  On O2 [place], off BIPAP, continue NEB.   1 acute sepsis Most likely secondary to SBP and left pneumonia Completed Rocephin for SBP 5 days, Rocephin/azithromycin for possible left pneumonia, follow-up CBC and blood culture negative so far.  Leukocytosis improved.  2 acute possible left sided pneumonia Empiric Rocephin/azithromycin and follow-up cultures: Negative so far.  3 acute SBP Empiric Rocephin as stated above, paracentesis cultures are negative so far.  4 acute hepatic encephalopathy Secondary  to alcoholic cirrhosis with associated ascites Lactulose 3 times daily.   5 chronic alcoholic liver cirrhosis With noted associated chronic thrombocytopenia, esophageal varices, recurrent episodes of hepatic encephalopathy, electrolyte abnormalities, and ascites  aspiration/fall/seizure precautions, rifaximin, continue spironolactone and Lasix.  Ascites.  Paracentesis today: successful ultrasound-guided paracentesis yielding 1.3 liters of peritoneal fluid.  Increased right-sided pleural effusion.  Thoracentesis today per Dr. Sung AmabileSimonds. US: Trace pleural fluid bilaterally.  6 history of seizures Stable Continue Keppra  7 chronic schizophrenia Stable Continue home regiment  Hypokalemia.  Given potassium supplement and improved.  Anemia of chronic disease.  Hemoglobin stable.  Generalized weakness.  PT evaluation  when patient is stable.  Morbid obesity   Discussed with Dr. Sung AmabileSimonds. All the records are reviewed and case discussed with Care Management/Social Worker. Management plans discussed with the patient, family and they are in agreement.  CODE STATUS: Full Code  TOTAL CRITICAL TIME TAKING CARE OF THIS PATIENT: 35 minutes.   More than 50% of the time was spent in counseling/coordination of care: YES  POSSIBLE D/C IN 2-3 DAYS, DEPENDING ON CLINICAL CONDITION.   Shaune PollackQing Cleta Heatley M.D on 09/18/2017 at 2:17 PM  Between 7am to 6pm - Pager - [phone]  After 6pm go to [email or web address] - Therapist, nutritionalpassword [email or web address]  [facility] [place] [facility]

## 2017-09-19 ENCOUNTER — Inpatient Hospital Stay: Payer: Medicare Other

## 2017-09-19 DIAGNOSIS — G9340 Encephalopathy, unspecified: Secondary | ICD-10-CM

## 2017-09-19 DIAGNOSIS — K652 Spontaneous bacterial peritonitis: Secondary | ICD-10-CM

## 2017-09-19 LAB — GLUCOSE, CAPILLARY
GLUCOSE-CAPILLARY: 75 mg/dL (ref 65–99)
GLUCOSE-CAPILLARY: 99 mg/dL (ref 65–99)
GLUCOSE-CAPILLARY: 67 mg/dL (ref 65–99)
Glucose-Capillary: 84 mg/dL (ref 65–99)
Glucose-Capillary: 100 mg/dL — ABNORMAL HIGH (ref 65–99)

## 2017-09-19 LAB — AMMONIA: AMMONIA: 58 umol/L — AB (ref 9–35)

## 2017-09-19 LAB — COMPREHENSIVE METABOLIC PANEL
ALT: 21 U/L (ref 17–63)
AST: 38 U/L (ref 15–41)
Albumin: 1.9 g/dL — ABNORMAL LOW (ref 3.5–5.0)
Alkaline Phosphatase: 94 U/L (ref 38–126)
Anion gap: 3 — ABNORMAL LOW (ref 5–15)
BUN: 16 mg/dL (ref 6–20)
CHLORIDE: 98 mmol/L — AB (ref 101–111)
CO2: 36 mmol/L — ABNORMAL HIGH (ref 22–32)
CREATININE: 0.77 mg/dL (ref 0.61–1.24)
Calcium: 7.9 mg/dL — ABNORMAL LOW (ref 8.9–10.3)
Glucose, Bld: 86 mg/dL (ref 65–99)
Potassium: 3.9 mmol/L (ref 3.5–5.1)
Sodium: 137 mmol/L (ref 135–145)
Total Bilirubin: 0.9 mg/dL (ref 0.3–1.2)
Total Protein: 8.9 g/dL — ABNORMAL HIGH (ref 6.5–8.1)

## 2017-09-19 MED ORDER — SULFAMETHOXAZOLE-TRIMETHOPRIM 800-160 MG PO TABS
1.0000 | ORAL_TABLET | Freq: Every day | ORAL | Status: DC
  Administered 2017-09-19 – 2017-09-22 (×4): 1 via ORAL
  Filled 2017-09-19 (×4): qty 1

## 2017-09-19 NOTE — Progress Notes (Signed)
RN was called into pt.'s room that he has to "pee really bad" Pt.'s foley has only been clamped for one hour. Prime doctor notified, new orders to discontinue to foley at this time. Pt is due to void by 0000 09/20/2017. Will continue to monitor pt.   Anabia Weatherwax [place]

## 2017-09-19 NOTE — Progress Notes (Signed)
RN has clamped foley catheter at 1630 per order. Per order, next shift RN needs to unclamp foley at 2030 for 4 hours then clamp for another 4 hours to monitor output. Will continue to monitor pt.   Shelia Kingsberry [place]

## 2017-09-19 NOTE — Progress Notes (Signed)
[facility] - [place] at [facility]   PATIENT NAME: Ernst BowlerJoey Hilbert    MR#:  [id number]  DATE OF BIRTH:  02-Jul-1981  SUBJECTIVE:   Respiratory status improving.  Patient eating okay. REVIEW OF SYSTEMS:   Review of Systems  Unable to perform ROS: Mental status change   Tolerating Diet:some Tolerating PT: pending  DRUG ALLERGIES:   Allergies  Allergen Reactions  . Tramadol Itching    VITALS:  Blood pressure (!) 154/78, pulse 100, temperature 98.3 F (36.8 C), temperature source Oral, resp. rate (!) 24, height 6\' 2"  (1.88 m), weight (!) 156.1 kg (344 lb 2.2 oz), SpO2 94 %.  PHYSICAL EXAMINATION:   Physical Exam  GENERAL:  36 y.o.-year-old patient lying in the bed with no acute distress. Obese-morbidly EYES: Pupils equal, round, reactive to light and accommodation. No scleral icterus. Extraocular muscles intact.  HEENT: Head atraumatic, normocephalic. Oropharynx and nasopharynx clear.  NECK:  Supple, no jugular venous distention. No thyroid enlargement, no tenderness.  LUNGS: Normal breath sounds bilaterally, no wheezing, rales, rhonchi. No use of accessory muscles of respiration.  CARDIOVASCULAR: S1, S2 normal. No murmurs, rubs, or gallops.  ABDOMEN: Soft, nontender, distended. Bowel sounds present. No organomegaly or mass. Foley+ EXTREMITIES: No cyanosis, clubbing or edema b/l.    NEUROLOGIC:lethargic PSYCHIATRIC:  patient islethargic SKIN: No obvious rash, lesion, or ulcer.   LABORATORY PANEL:  CBC Recent Labs  Lab 09/18/17 0625  WBC 10.3  HGB 10.0*  HCT 32.6*  PLT 96*    Chemistries  Recent Labs  Lab 09/14/17 0436  09/19/17 0452  NA 140   < > 137  K 3.0*   < > 3.9  CL 100*   < > 98*  CO2 37*   < > 36*  GLUCOSE 101*   < > 86  BUN 22*   < > 16  CREATININE 0.83   < > 0.77  CALCIUM 7.4*   < > 7.9*  MG 1.9  --   --   AST 36   < > 38  ALT 20   < > 21  ALKPHOS 84   < > 94  BILITOT 1.9*   < > 0.9   < > = values in this interval not  displayed.   Cardiac Enzymes No results for input(s): TROPONINI in the last 168 hours. RADIOLOGY:  Dg Chest Port 1 View  Result Date: 09/19/2017 CLINICAL DATA:  Respiratory failure. EXAM: PORTABLE CHEST 1 VIEW COMPARISON:  One-view chest x-ray 09/16/2017 FINDINGS: The heart is enlarged. Lung volumes are low. Right greater than left pleural effusions have improved slightly. Bibasilar airspace disease is still worse on the left. Moderate interstitial edema is noted. IMPRESSION: 1. Cardiomegaly with moderate interstitial edema and bilateral effusions. Findings compatible with congestive heart failure. 2. Persistent basilar airspace disease, left greater than right. This remains concerning for pneumonia. Electronically Signed   By: Marin Robertshristopher  Mattern M.D.   On: 09/19/2017 07:05   ASSESSMENT AND PLAN:  1 Acute respiratory failure with hypoxia and hypercapnic.  On O2 [place], off BIPAP, continue NEB. -Now on nasal cannula oxygen.  Patient will use BiPAP at a time for possible obstructive sleep apnea   2acute possible left sided pneumonia Pleated Rocephin/azithromycin and follow-up cultures: Negative so far.  3. Sepsis due to acute SBP completed Rocephin as stated above, paracentesis cultures are negative so far. Will start patient on chronic SBP prophylaxis with Cipro  4acute hepatic encephalopathy Secondary to alcoholic cirrhosis with associated ascites Lactulose  3 times daily.   5chronic alcoholic liver cirrhosis associated chronic thrombocytopenia, esophageal varices, recurrent episodes of hepatic encephalopathy, electrolyte abnormalities, and ascites -Cont rifaximin, continue spironolactone and Lasix. -DT.  Improved. -CIWA protocol. Off precedex iv, off banana bag iv.   6.Ascites, recurrent -successful ultrasound-guided paracentesis yielding 1.3 liters of peritoneal fluid.  6history of seizures Stable Continue Keppra  7chronic schizophrenia Stable Continue home  regiment  8Generalized weakness.  PT evaluation  when patient is stable  Transfer to med floor  Foley for urinary retention   Case discussed with Care Management/Social Worker. Management plans discussed with the patient, family and they are in agreement.  CODE STATUS: full   TOTAL TIME TAKING CARE OF THIS PATIENT: *30* minutes.  >50% time spent on counselling and coordination of care  POSSIBLE D/C IN  1-2DAYS, DEPENDING ON CLINICAL CONDITION.  Note: This dictation was prepared with Dragon dictation along with smaller phrase technology. Any transcriptional errors that result from this process are unintentional.  Enedina FinnerSona Dominik Lauricella M.D on 09/19/2017 at 3:06 PM  Between 7am to 6pm - Pager - [phone]  After 6pm go to [email or web address] - password [email or web address] [facility]  [facility] [place] [facility]  Office  [phone]  CC: Primary care physician; Galen ManilaKennedy, [name], NPPatient ID: Renard MatterJoey Arnold Duey, male   DOB: Jul 16, 1981, 36 y.o.   MRN: [id number]

## 2017-09-19 NOTE — Evaluation (Signed)
Physical Therapy Evaluation Patient Details Name: Howard [name] MRN: [id number] DOB: Aug 05, 1981 Today's Date: 09/19/2017   History of Present Illness  Pt is a 36 y.o. male presenting to hospital with LBP s/p fall backwards 6 days prior.  Per chart review, pt diagnosed with L1 compression fx 12/17 and discharged with prescription for back brace (per Neurosurgery recommendation.).  Pt admitted with acute sepsis, possible L sided PNA, respiratory failure with hypoxia, acute hepatic encephalopathy, withdrawal symptoms, and acute SBP.  PMH includes alcoholism, a-fib, COPD, DM, htn, seizures, schizophrenia, PTSD, cirrhosis, liver failure, L1 compression fx.  Clinical Impression  Prior to hospital admission, pt was modified independent ambulating short household distances with RW and has hospice services at home.  Pt reports limited ambulation recently d/t LBP d/t fall (pt with L1 compression fx noted at recent ED visit and discharged with prescription for back brace but has been unable to fill it; nursing notified).  Pt lives with his brother and mother on main level of home with 5 STE B railing.  Currently pt is SBA supine to/from sit, min assist to stand, and CGA to side step a few feet with RW along bed.  Pt c/o 6/10 LBP sitting edge of bed initially but reported it went away after a minute and rest of session pain 1-2/10 LBP.  Pt requesting to stand during session to see what he could do and verbalizing being very happy regarding status of his LBP during session.  Overall pt demonstrating generalized weakness, deconditioning, and SOB with activity.  Pt would benefit from skilled PT to address noted impairments and functional limitations (see below for any additional details).  Currently recommend STR but will continue to assess pt's progress during hospitalization and update discharge recommendations as appropriate.    Follow Up Recommendations SNF    Equipment Recommendations  Rolling walker with  5" wheels    Recommendations for Other Services       Precautions / Restrictions Precautions Precautions: Fall Precaution Comments: Pt recently given prescription for back brace (d/t L1 compression fx) but reports unable to fill it and has been ambulating without anything. Restrictions Weight Bearing Restrictions: No      Mobility  Bed Mobility Overal bed mobility: Needs Assistance Bed Mobility: Supine to Sit;Sit to Supine;Rolling Rolling: Supervision   Supine to sit: Supervision Sit to supine: Supervision   General bed mobility comments: vc's for logrolling; mild to moderate effort to perform on own  Transfers Overall transfer level: Needs assistance Equipment used: Rolling walker (2 wheeled) Transfers: Sit to/from Stand Sit to Stand: Min assist         General transfer comment: minimal assist to initiate stand with RW  Ambulation/Gait Ambulation/Gait assistance: Min guard Ambulation Distance (Feet): (pt able to side step to R a few feet with RW and vc's) Assistive device: Rolling walker (2 wheeled)   Gait velocity: decreased   General Gait Details: decreased B step length/foot clearance/heelstrike; SOB and fatigue noted with activity  Stairs            Wheelchair Mobility    Modified Rankin (Stroke Patients Only)       Balance Overall balance assessment: History of Falls;Needs assistance Sitting-balance support: Feet supported;No upper extremity supported Sitting balance-Leahy Scale: Fair Sitting balance - Comments: steady static sitting balance   Standing balance support: Bilateral upper extremity supported Standing balance-Leahy Scale: Poor Standing balance comment: requires B UE support for static standing balance (using RW CGA)  Pertinent Vitals/Pain Pain Assessment: 0-10 Pain Score: 2 (6/10 sitting edge of bed; 1-2/10 rest of session) Pain Location: low back pain Pain Descriptors / Indicators:  Sore Pain Intervention(s): Limited activity within patient's tolerance;Monitored during session;Repositioned    Home Living Family/patient expects to be discharged to:: Private residence Living Arrangements: Parent;Other relatives(mother and brother) Available Help at Discharge: Family;Available 24 hours/day;Home health(Hospice home health aide every other day) Type of Home: House Home Access: Stairs to enter Entrance Stairs-Rails: Right;Left;Can reach both Entrance Stairs-Number of Steps: 5 Home Layout: Multi-level;Able to live on main level with bedroom/bathroom Home Equipment: Dan HumphreysWalker - 2 wheels;Cane - quad;Shower seat      Prior Function Level of Independence: Needs assistance   Gait / Transfers Assistance Needed: Ambulates short distances in home with RW; reports 1-2 falls per month  ADL's / Homemaking Assistance Needed: Requiring assist for ADLs/IADLs. Has Home health aid that comes "every other day".        Hand Dominance        Extremity/Trunk Assessment   Upper Extremity Assessment Upper Extremity Assessment: Generalized weakness    Lower Extremity Assessment Lower Extremity Assessment: Generalized weakness       Communication   Communication: No difficulties  Cognition Arousal/Alertness: Awake/alert Behavior During Therapy: WFL for tasks assessed/performed Overall Cognitive Status: Within Functional Limits for tasks assessed                                        General Comments General comments (skin integrity, edema, etc.): Pt resting in bed upon PT arrival.  Nursing cleared pt for participation in physical therapy.  Pt agreeable to PT session.    Exercises     Assessment/Plan    PT Assessment Patient needs continued PT services  PT Problem List Decreased strength;Decreased activity tolerance;Decreased balance;Decreased mobility;Pain       PT Treatment Interventions DME instruction;Gait training;Stair training;Functional mobility  training;Therapeutic activities;Therapeutic exercise;Balance training;Patient/family education    PT Goals (Current goals can be found in the Care Plan section)  Acute Rehab PT Goals Patient Stated Goal: to get stronger and be able to walk more PT Goal Formulation: With patient Time For Goal Achievement: 10/03/17 Potential to Achieve Goals: Good    Frequency Min 2X/week   Barriers to discharge Decreased caregiver support      Co-evaluation               AM-PAC PT "6 Clicks" Daily Activity  Outcome Measure Difficulty turning over in bed (including adjusting bedclothes, sheets and blankets)?: A Little Difficulty moving from lying on back to sitting on the side of the bed? : A Lot Difficulty sitting down on and standing up from a chair with arms (e.g., wheelchair, bedside commode, etc,.)?: Unable Help needed moving to and from a bed to chair (including a wheelchair)?: A Little Help needed walking in hospital room?: A Little Help needed climbing 3-5 steps with a railing? : A Lot 6 Click Score: 14    End of Session Equipment Utilized During Treatment: Gait belt;Oxygen Activity Tolerance: Patient limited by fatigue;Other (comment)(Limited d/t SOB with activity) Patient left: in bed;with call bell/phone within reach;with bed alarm set Nurse Communication: Mobility status;Precautions;Other (comment)(Pt's LBP status during session and regarding brace) PT Visit Diagnosis: Other abnormalities of gait and mobility (R26.89);Muscle weakness (generalized) (M62.81);History of falling (Z91.81)    Time: [id number] PT Time Calculation (min) (ACUTE ONLY): 30 min  Charges:   PT Evaluation $PT Eval Low Complexity: 1 Low PT Treatments $Therapeutic Activity: 8-22 mins   PT G Codes:   PT G-Codes **NOT FOR INPATIENT CLASS** Functional Assessment Tool Used: AM-PAC 6 Clicks Basic Mobility Functional Limitation: Mobility: Walking and moving around Mobility: Walking and Moving Around Current  Status (W1191(G8978): At least 40 percent but less than 60 percent impaired, limited or restricted Mobility: Walking and Moving Around Goal Status [phone](G8979): At least 1 percent but less than 20 percent impaired, limited or restricted    Hendricks Limesmily Early Ord, PT 09/19/17, 3:48 PM [phone]

## 2017-09-19 NOTE — Progress Notes (Signed)
CC follow up SOB  HPI   Transition from HF [place] to conventional nasal cannula and holding SPO2 above 90%.  Intermittently lethargic but oriented.  Denies pain and SOB.  nocturnal BiPAP  Vitals:   09/19/17 0300 09/19/17 0400 09/19/17 0500 09/19/17 0600  BP: (!) 155/62 (!) 124/56 (!) 154/70 (!) 142/70  Pulse: 100 96 96 94  Resp: (!) 24 (!) 25 (!) 22 (!) 23  Temp:  98.1 F (36.7 C)    TempSrc:  Axillary    SpO2: 92% 95% 93% 94%  Weight:      Height:      Ray 4 LPM [place]  AAOx3 HEENT WNL Breath sounds diminished without adventitious sounds Regular, no M Obese, NT, diminished bowel sounds Trace symmetric LE edema  BMET BMP Latest Ref Rng & Units 09/19/2017 09/18/2017 09/17/2017  Glucose 65 - 99 mg/dL 86 88 621(H133(H)  BUN 6 - 20 mg/dL 16 18 13   Creatinine 0.61 - 1.24 mg/dL 0.860.77 5.780.88 4.690.80  Sodium 135 - 145 mmol/L 137 136 134(L)  Potassium 3.5 - 5.1 mmol/L 3.9 4.4 4.2  Chloride 101 - 111 mmol/L 98(L) 101 100(L)  CO2 22 - 32 mmol/L 36(H) 33(H) 31  Calcium 8.9 - 10.3 mg/dL 7.9(L) 7.7(L) 7.7(L)    CBC Latest Ref Rng & Units 09/18/2017 09/16/2017 09/15/2017  WBC 3.8 - 10.6 K/uL 10.3 9.8 12.0(H)  Hemoglobin 13.0 - 18.0 g/dL 10.0(L) 10.7(L) 10.4(L)  Hematocrit 40.0 - 52.0 % 32.6(L) 34.8(L) 33.8(L)  Platelets 150 - 440 K/uL 96(L) 98(L) 98(L)     CXR: No new film IMPRESSION: Acute (likely on chronic) hypoxemic/hypercarbic respiratory failure Suspected right pleural effusion -not much fluid noted on ultrasound 12/27 Hypokalemia, resolved Mild hyponatremia, resolved Metabolic alkalosis, improved Alcoholic cirrhosis Seizure disorder Schizophrenia Acute encephalopathy, mild- likely TME/hepatic encephalopathy Spontaneous bacterial peritonitis -completed 5 days ceftriaxone Type 2 diabetes, controlled Mild thrombocytopenia  PLAN/REC: Continue supplemental oxygen Continue to correct electrolytes as indicated 5-day course of ceftriaxone completed Continue SSI Continue lactulose and  rifaximin Continue furosemide and spironolactone Mandatory nocturnal BiPAP ordered for likely OSA/OHS   OK to transfer to gen  Med floor with biPAP QHS  Lucie LeatherKurian David Pawel Soules, M.D.  [facility]  Medical Director [facility] Medical Director [facility]

## 2017-09-19 NOTE — Progress Notes (Signed)
Pt being transferred to 226. Report called to Leotis ShamesLauren, RN. Attempted to notify pt's mom, legal guardian, also but no answer at either contact number in system.  Lurene ShadowBAllen, RN

## 2017-09-20 LAB — GLUCOSE, CAPILLARY
GLUCOSE-CAPILLARY: 72 mg/dL (ref 65–99)
GLUCOSE-CAPILLARY: 87 mg/dL (ref 65–99)
GLUCOSE-CAPILLARY: 105 mg/dL — AB (ref 65–99)
Glucose-Capillary: 86 mg/dL (ref 65–99)
Glucose-Capillary: 86 mg/dL (ref 65–99)

## 2017-09-20 NOTE — Progress Notes (Signed)
[facility] [place] [facility]   PATIENT NAME: Howard [name]    MR#:  [id number]  DATE OF BIRTH:  Aug 23, 1981  SUBJECTIVE:   Respiratory status improving.  Patient eating okay. Sleeping most of the day per RN.  Patient is a poor historian. REVIEW OF SYSTEMS:   Review of Systems  Unable to perform ROS: Mental status change   Tolerating Diet:yesTolerating WU:JWJXBPT:rehab  DRUG ALLERGIES:   Allergies  Allergen Reactions  . Tramadol Itching    VITALS:  Blood pressure (!) 156/54, pulse 100, temperature 98.2 F (36.8 C), temperature source Oral, resp. rate 20, height 6\' 2"  (1.88 m), weight (!) 156.1 kg (344 lb 2.2 oz), SpO2 94 %.  PHYSICAL EXAMINATION:   Physical Exam  GENERAL:  36 y.o.-year-old patient lying in the bed with no acute distress. Obese-morbidly EYES: Pupils equal, round, reactive to light and accommodation. No scleral icterus. Extraocular muscles intact.  HEENT: Head atraumatic, normocephalic. Oropharynx and nasopharynx clear.  NECK:  Supple, no jugular venous distention. No thyroid enlargement, no tenderness.  LUNGS: Normal breath sounds bilaterally, no wheezing, rales, rhonchi. No use of accessory muscles of respiration.  CARDIOVASCULAR: S1, S2 normal. No murmurs, rubs, or gallops.  ABDOMEN: Soft, nontender, distended. Bowel sounds present. No organomegaly or mass. Foley+ EXTREMITIES: No cyanosis, clubbing or edema b/l.    NEUROLOGIC:lethargic PSYCHIATRIC:  patient islethargic SKIN: No obvious rash, lesion, or ulcer.   LABORATORY PANEL:  CBC Recent Labs  Lab 09/18/17 0625  WBC 10.3  HGB 10.0*  HCT 32.6*  PLT 96*    Chemistries  Recent Labs  Lab 09/14/17 0436  09/19/17 0452  NA 140   < > 137  K 3.0*   < > 3.9  CL 100*   < > 98*  CO2 37*   < > 36*  GLUCOSE 101*   < > 86  BUN 22*   < > 16  CREATININE 0.83   < > 0.77  CALCIUM 7.4*   < > 7.9*  MG 1.9  --   --   AST 36   < > 38  ALT 20   < > 21  ALKPHOS 84   < > 94   BILITOT 1.9*   < > 0.9   < > = values in this interval not displayed.   Cardiac Enzymes No results for input(s): TROPONINI in the last 168 hours. RADIOLOGY:  Dg Chest Port 1 View  Result Date: 09/19/2017 CLINICAL DATA:  Respiratory failure. EXAM: PORTABLE CHEST 1 VIEW COMPARISON:  One-view chest x-ray 09/16/2017 FINDINGS: The heart is enlarged. Lung volumes are low. Right greater than left pleural effusions have improved slightly. Bibasilar airspace disease is still worse on the left. Moderate interstitial edema is noted. IMPRESSION: 1. Cardiomegaly with moderate interstitial edema and bilateral effusions. Findings compatible with congestive heart failure. 2. Persistent basilar airspace disease, left greater than right. This remains concerning for pneumonia. Electronically Signed   By: Marin Robertshristopher  Mattern M.D.   On: 09/19/2017 07:05   ASSESSMENT AND PLAN:  1 Acute respiratory failure with hypoxia and hypercapnic.  On O2 [place], off BIPAP, continue NEB. -Now on nasal cannula oxygen.  Patient will use BiPAP at a time for possible obstructive sleep apnea   2acute possible left sided pneumonia Completed Rocephin/azithromycin and follow-up cultures: Negative so far.  3. Sepsis due to acute SBP completed Rocephin as stated above, paracentesis cultures are negative so far. Will start patient on chronic SBP prophylaxis with Cipro  4acute  hepatic encephalopathy Secondary to alcoholic cirrhosis with associated ascites Lactulose 3 times daily.   5chronic alcoholic liver cirrhosis associated chronic thrombocytopenia, esophageal varices, recurrent episodes of hepatic encephalopathy, electrolyte abnormalities, and ascites -Cont rifaximin, continue spironolactone and Lasix. -DT.  Improved. -CIWA protocol. Off precedex iv, off banana bag iv.   6.Ascites, recurrent -successful ultrasound-guided paracentesis yielding 1.3 liters of peritoneal fluid.  6history of seizures Stable Continue  Keppra  7chronic schizophrenia Stable   8.Generalized weakness.  PT evaluation   noted.    Foley for urinary retention--Foley removed.  Patient is able to urinate well.  Social worker for discharge planning   Case discussed with Care Management/Social Worker. Management plans discussed with the patient, family and they are in agreement.  CODE STATUS: full   TOTAL TIME TAKING CARE OF THIS PATIENT: *25* minutes.  >50% time spent on counselling and coordination of care  POSSIBLE D/C IN  1-2DAYS, DEPENDING ON CLINICAL CONDITION.  Note: This dictation was prepared with Dragon dictation along with smaller phrase technology. Any transcriptional errors that result from this process are unintentional.  Enedina FinnerSona Lakera Viall M.D on 09/20/2017 at 12:44 PM  Between 7am to 6pm - Pager - [phone]  After 6pm go to [email or web address] - password [email or web address] [facility]  [facility] [place] [facility]  Office  [phone]  CC: Primary care physician; Galen ManilaKennedy, [name], NPPatient ID: Howard [name], male   DOB: Jan 26, 1981, 36 y.o.   MRN: [id number]

## 2017-09-20 NOTE — Clinical Social Work Note (Signed)
CSW met with patient to discuss discharge planning. The patient wants to discuss with his mother prior to giving permission for the referral. The patient's mother's phone is currently disconnected.CSW will attempt to meet with the patient again when able.  Santiago Bumpers, MSW, Latanya Presser  [phone]

## 2017-09-21 LAB — CBC WITH DIFFERENTIAL/PLATELET
BASOS ABS: 0.1 10*3/uL (ref 0–0.1)
BASOS PCT: 1 %
EOS ABS: 0.2 10*3/uL (ref 0–0.7)
Eosinophils Relative: 3 %
HCT: 31.8 % — ABNORMAL LOW (ref 40.0–52.0)
HEMOGLOBIN: 10 g/dL — AB (ref 13.0–18.0)
LYMPHS ABS: 0.9 10*3/uL — AB (ref 1.0–3.6)
Lymphocytes Relative: 12 %
MCH: 26.8 pg (ref 26.0–34.0)
MCHC: 31.4 g/dL — ABNORMAL LOW (ref 32.0–36.0)
MCV: 85.5 fL (ref 80.0–100.0)
Monocytes Absolute: 0.7 10*3/uL (ref 0.2–1.0)
Monocytes Relative: 9 %
NEUTROS PCT: 75 %
Neutro Abs: 5.6 10*3/uL (ref 1.4–6.5)
Platelets: 112 10*3/uL — ABNORMAL LOW (ref 150–440)
RBC: 3.72 MIL/uL — AB (ref 4.40–5.90)
RDW: 18.8 % — ABNORMAL HIGH (ref 11.5–14.5)
WBC: 7.5 10*3/uL (ref 3.8–10.6)

## 2017-09-21 LAB — GLUCOSE, CAPILLARY
GLUCOSE-CAPILLARY: 78 mg/dL (ref 65–99)
GLUCOSE-CAPILLARY: 127 mg/dL — AB (ref 65–99)
GLUCOSE-CAPILLARY: 89 mg/dL (ref 65–99)
GLUCOSE-CAPILLARY: 75 mg/dL (ref 65–99)
GLUCOSE-CAPILLARY: 87 mg/dL (ref 65–99)
Glucose-Capillary: 116 mg/dL — ABNORMAL HIGH (ref 65–99)

## 2017-09-21 LAB — PATHOLOGIST SMEAR REVIEW

## 2017-09-21 IMAGING — US US ABDOMEN LIMITED
1 series · 14 of 16 positions shown · non-contrast
Comparison: 11/05/2015

CLINICAL DATA: Possible ascites

EXAM:
LIMITED ABDOMEN ULTRASOUND FOR ASCITES
TECHNIQUE: Limited ultrasound survey for ascites was performed in all four
abdominal quadrants.

[Series 1: us abdomen limited · 0.31mm/px · 14 of 16 slices shown]
[im 1/16]
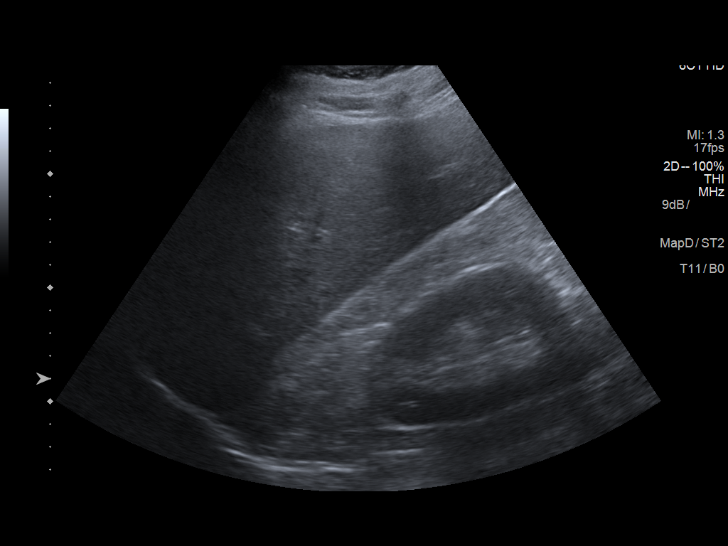
[im 2/16]
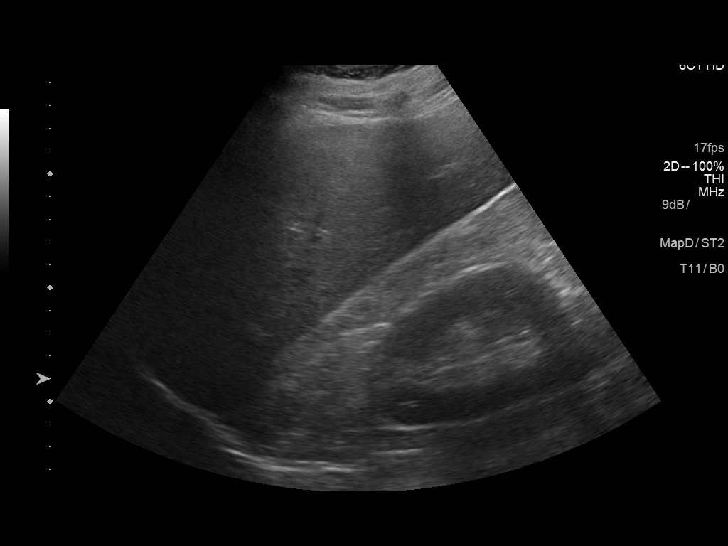
[im 3/16]
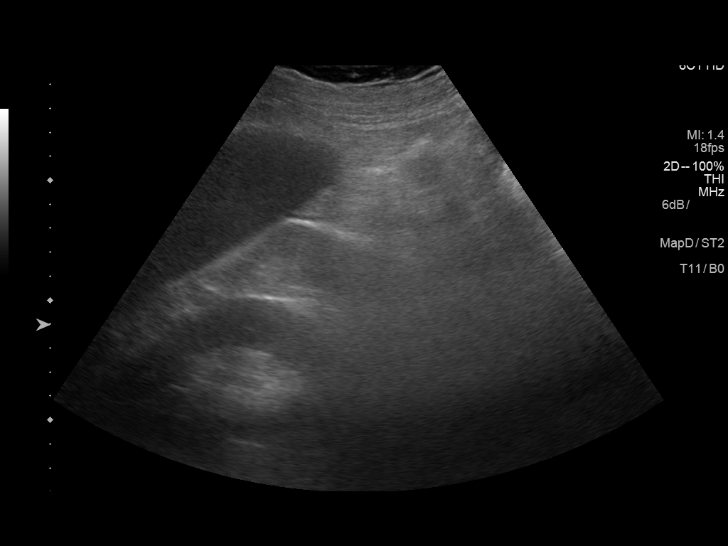
[im 5/16]
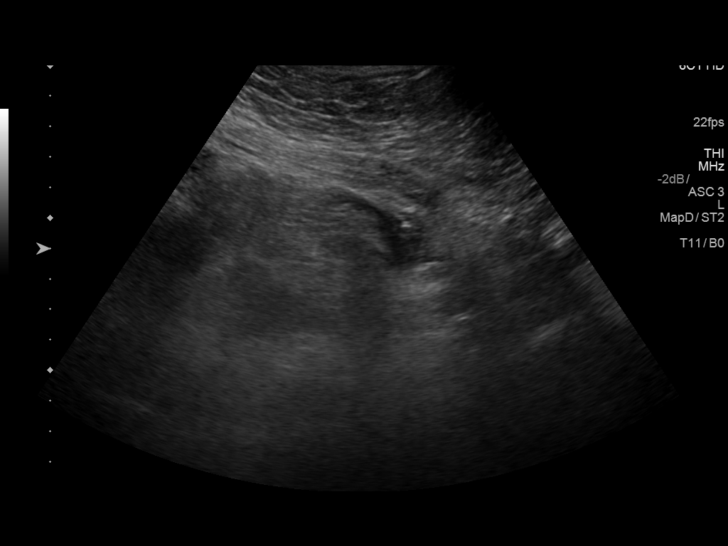
[im 6/16]
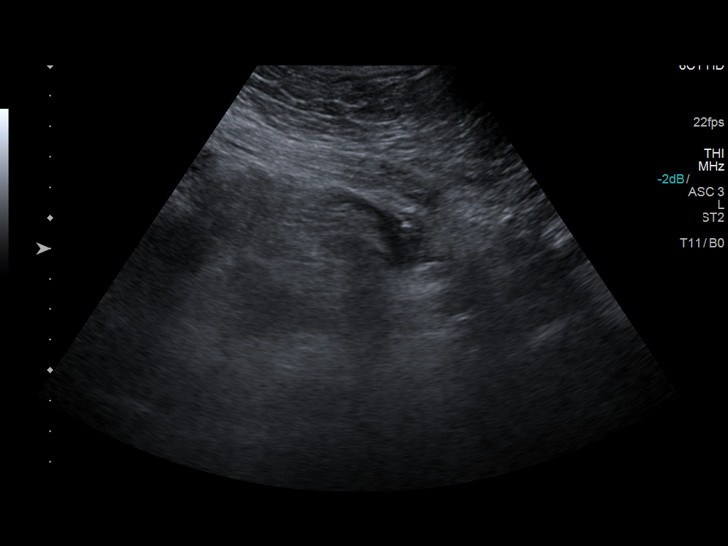
[im 7/16]
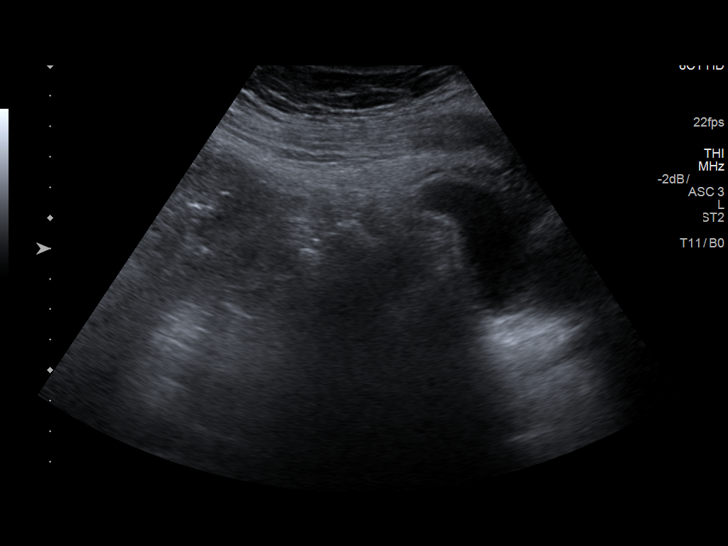
[im 8/16]
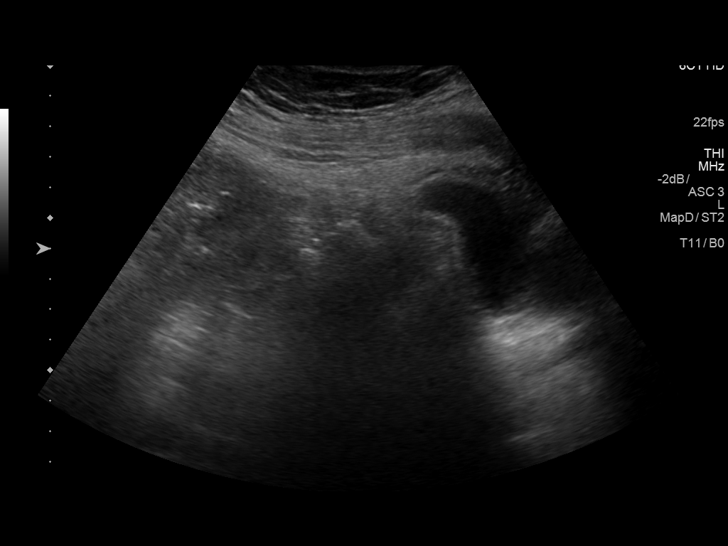
[im 9/16]
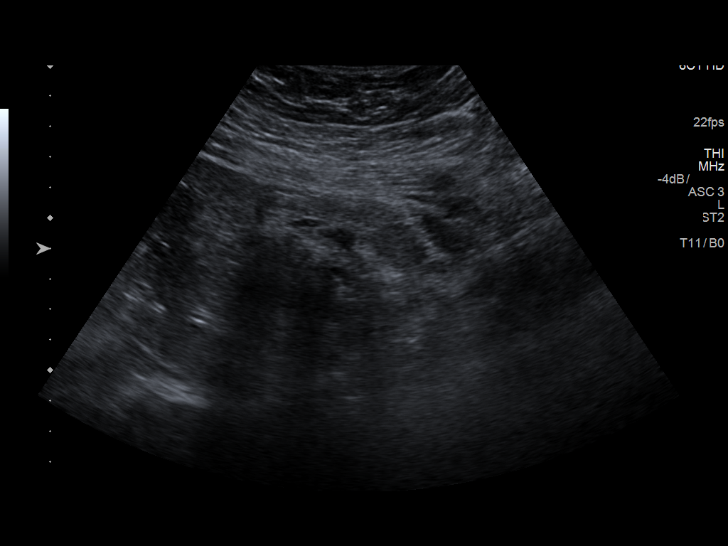
[im 10/16]
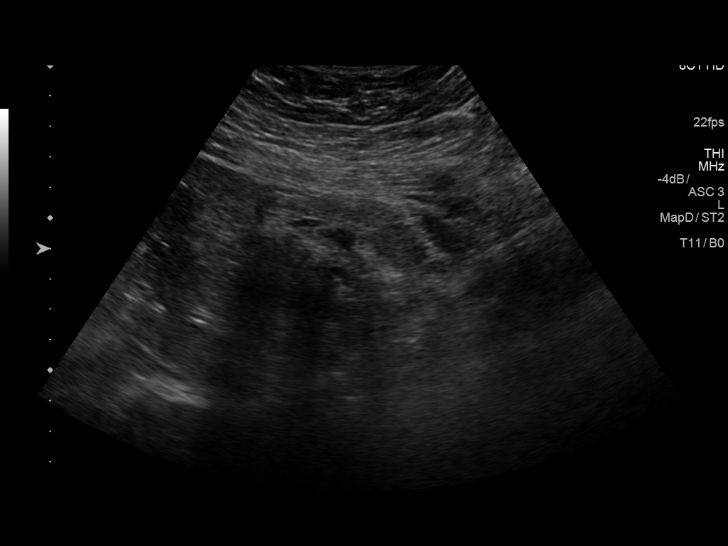
[im 11/16]
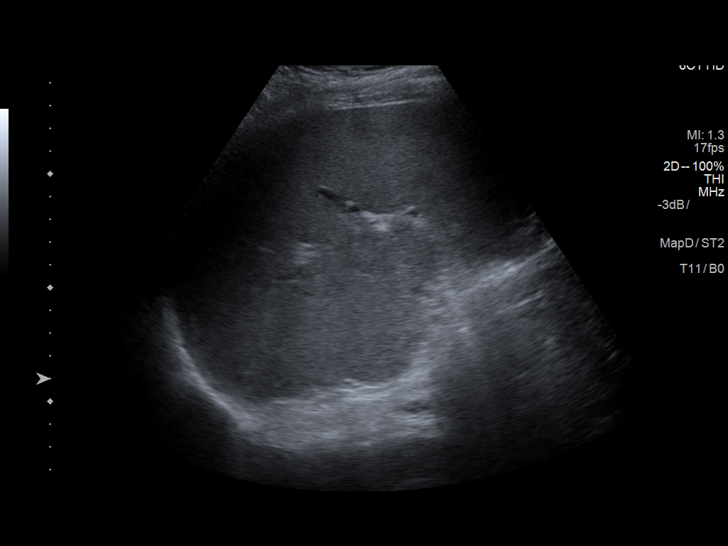
[im 13/16]
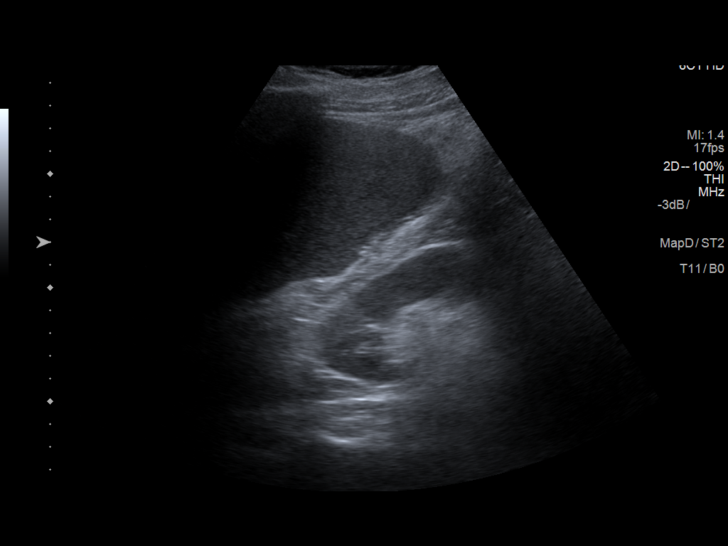
[im 14/16]
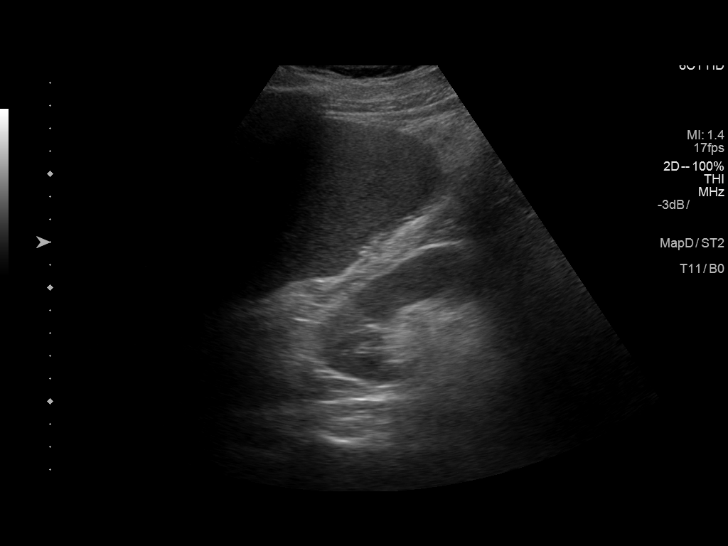
[im 15/16]
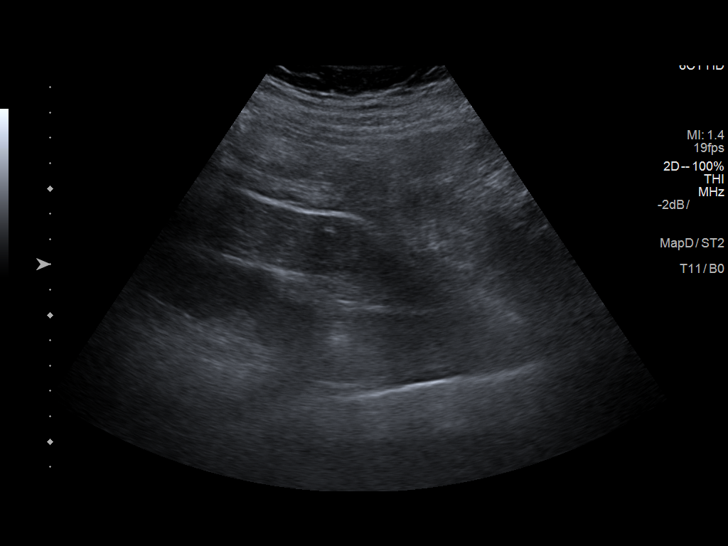
[im 16/16]
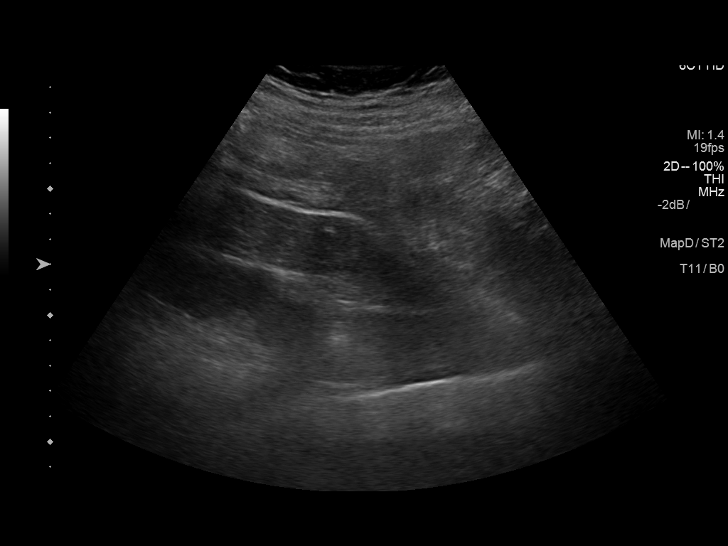

[14 of 16 positions shown; findings below may reference images not displayed]

FINDINGS: Sonographic evaluation in all 4 quadrants reveals a trace amount of
fluid within the abdomen although no sizable pocket to allow for
safe paracentesis is noted.
IMPRESSION: Trace free fluid.  No safe pocket for paracentesis is noted.

## 2017-09-21 MED ORDER — OXYCODONE HCL 5 MG PO TABS
5.0000 mg | ORAL_TABLET | Freq: Four times a day (QID) | ORAL | Status: DC | PRN
  Administered 2017-09-21 (×4): 5 mg via ORAL
  Filled 2017-09-21 (×4): qty 1

## 2017-09-21 NOTE — Progress Notes (Signed)
PT Cancellation Note  Patient Details Name: Renard MatterJoey Arnold Matchett MRN: [id number] DOB: 12-22-1980   Cancelled Treatment:    Reason Eval/Treat Not Completed: Fatigue/lethargy limiting ability to participate. Treatment attempted; unable to awaken pt through voice, touch, sternal rub  for participation in therapy. Re attempt at a later date.    Scot DockHeidi E Barnes, PTA 09/21/2017, 2:37 PM

## 2017-09-21 NOTE — Care Management (Signed)
RNCM confirmed with Roxanne from [facility] that patient is followed by home hospice.

## 2017-09-21 NOTE — Care Management (Signed)
It patient discharges please notify [facility] at [phone], and fax discharge summary to [phone].  Bedside RN updated with this information

## 2017-09-21 NOTE — Progress Notes (Signed)
[facility] [place] [facility]   PATIENT NAME: Howard [name]    MR#:  [id number]  DATE OF BIRTH:  Mar 08, 1981  SUBJECTIVE:  On 3-4 L oxygen, agreeable to go home but not able to get in touch with family.  He is refusing rehab REVIEW OF SYSTEMS:   Review of Systems  Unable to perform ROS: Mental status change   Tolerating Diet:yesTolerating IH:KVQQVPT:rehab DRUG ALLERGIES:   Allergies  Allergen Reactions  . Tramadol Itching    VITALS:  Blood pressure 131/66, pulse 97, temperature 98.3 F (36.8 C), temperature source Oral, resp. rate 18, height 6\' 2"  (1.88 m), weight (!) 156.1 kg (344 lb 2.2 oz), SpO2 95 %. PHYSICAL EXAMINATION:   Physical Exam  GENERAL:  36 y.o.-year-old patient lying in the bed with no acute distress. Obese-morbidly EYES: Pupils equal, round, reactive to light and accommodation. No scleral icterus. Extraocular muscles intact.  HEENT: Head atraumatic, normocephalic. Oropharynx and nasopharynx clear.  NECK:  Supple, no jugular venous distention. No thyroid enlargement, no tenderness.  LUNGS: Normal breath sounds bilaterally, no wheezing, rales, rhonchi. No use of accessory muscles of respiration.  CARDIOVASCULAR: S1, S2 normal. No murmurs, rubs, or gallops.  ABDOMEN: Soft, nontender, distended. Bowel sounds present. No organomegaly or mass. EXTREMITIES: No cyanosis, clubbing or edema b/l.    NEUROLOGIC:lethargic, non-focal PSYCHIATRIC:  patient is lethargic, falls asleep easily SKIN: No obvious rash, lesion, or ulcer.   LABORATORY PANEL:  CBC Recent Labs  Lab 09/21/17 0548  WBC 7.5  HGB 10.0*  HCT 31.8*  PLT 112*    Chemistries  Recent Labs  Lab 09/19/17 0452  NA 137  K 3.9  CL 98*  CO2 36*  GLUCOSE 86  BUN 16  CREATININE 0.77  CALCIUM 7.9*  AST 38  ALT 21  ALKPHOS 94  BILITOT 0.9   Cardiac Enzymes No results for input(s): TROPONINI in the last 168 hours. RADIOLOGY:  No results found. ASSESSMENT AND PLAN:  1  Acute respiratory failure with hypoxia and hypercapnic.  On 3 lO2 [place], continue NEB. -Now on nasal cannula oxygen.  Patient will use BiPAP at a time for possible obstructive sleep apnea   2acute possible left sided pneumonia Completed Rocephin/azithromycin   3. Sepsis due to acute SBP -Treated Rocephin as stated above, paracentesis cultures are negative so far. -on chronic SBP prophylaxis with Cipro  4acute hepatic encephalopathy Secondary to alcoholic cirrhosis with associated ascites Lactulose 3 times daily.   5chronic alcoholic liver cirrhosis associated chronic thrombocytopenia, esophageal varices, recurrent episodes of hepatic encephalopathy, electrolyte abnormalities, and ascites -Cont rifaximin, continue spironolactone and Lasix. -DT.  Improved. -CIWA protocol. Off precedex iv, off banana bag iv.   6.Ascites, recurrent -successful ultrasound-guided paracentesis yielding 1.3 liters of peritoneal fluid.  6history of seizures Continue Keppra  7chronic schizophrenia Stable  8.Generalized weakness.  PT recommends rehab - patient refused  Social worker for discharge planning, patient is refusing rehab Is agreeable to go home but unable to get in touch with family I also tried calling his mother not able to get in touch with her.   Case discussed with Care Management/Social Worker. Management plans discussed with the patient, nursing and they are in agreement.  CODE STATUS: full   TOTAL TIME TAKING CARE OF THIS PATIENT: *25* minutes.  >50% time spent on counselling and coordination of care  POSSIBLE D/C IN 1-2 DAYS, DEPENDING ON CLINICAL CONDITION.  And placement  Note: This dictation was prepared with Dragon dictation along with  smaller phrase technology. Any transcriptional errors that result from this process are unintentional.  Delfino LovettVipul Ndrew Creason M.D on 09/21/2017 at 4:22 PM  Between 7am to 6pm - Pager - [phone]  After 6pm go to [email or web address] - password  [email or web address] [facility]  [facility] [place] [facility]  Office  [phone]  CC: Primary care physician; Galen ManilaKennedy, [name], NPPatient ID: Howard [name], male   DOB: 12/26/1980, 36 y.o.   MRN: [id number]

## 2017-09-21 NOTE — Care Management Important Message (Signed)
Important Message  Patient Details  Name: Renard MatterJoey Arnold Arnall MRN: [id number] Date of Birth: [birth year]   Medicare Important Message Given:  Yes    Chapman FitchBOWEN, Bertina Guthridge T, RN 09/21/2017, 4:29 PM

## 2017-09-21 NOTE — Progress Notes (Signed)
Clozapine monitoring:  Patient taking clozapine PTA. CBC with differential obtained on 12/31.   [facility] 5600 Labs entered into clozapine registry on 12/31 Ok to receive clozapine with weekly monitoring.  Will recheck CBC w/diff in one week.  Valentina Guhristy, Santresa Levett D, PharmD 09/21/17 11:57 AM

## 2017-09-22 ENCOUNTER — Inpatient Hospital Stay: Payer: Medicare Other

## 2017-09-22 DIAGNOSIS — J9622 Acute and chronic respiratory failure with hypercapnia: Secondary | ICD-10-CM

## 2017-09-22 DIAGNOSIS — J9621 Acute and chronic respiratory failure with hypoxia: Secondary | ICD-10-CM

## 2017-09-22 LAB — CBC
HCT: 32 % — ABNORMAL LOW (ref 40.0–52.0)
Hemoglobin: 10 g/dL — ABNORMAL LOW (ref 13.0–18.0)
MCH: 26.9 pg (ref 26.0–34.0)
MCHC: 31.2 g/dL — ABNORMAL LOW (ref 32.0–36.0)
MCV: 86.3 fL (ref 80.0–100.0)
PLATELETS: 114 10*3/uL — AB (ref 150–440)
RBC: 3.7 MIL/uL — ABNORMAL LOW (ref 4.40–5.90)
RDW: 18.7 % — AB (ref 11.5–14.5)
WBC: 9.1 10*3/uL (ref 3.8–10.6)

## 2017-09-22 LAB — BLOOD GAS, ARTERIAL
Acid-Base Excess: 14.2 mmol/L — ABNORMAL HIGH (ref 0.0–2.0)
Acid-Base Excess: 16.9 mmol/L — ABNORMAL HIGH (ref 0.0–2.0)
BICARBONATE: 44 mmol/L — AB (ref 20.0–28.0)
Bicarbonate: 42.9 mmol/L — ABNORMAL HIGH (ref 20.0–28.0)
Delivery systems: POSITIVE
EXPIRATORY PAP: 8
FIO2: 1
FIO2: 1
Inspiratory PAP: 16
MECHANICAL RATE: 18
MECHVT: 550 mL
O2 SAT: 86.5 %
O2 Saturation: 86.8 %
PATIENT TEMPERATURE: 37
PCO2 ART: 98 mmHg — AB (ref 32.0–48.0)
PCO2 ART: 59 mmHg — AB (ref 32.0–48.0)
PEEP: 8 cmH2O
PH ART: 7.26 — AB (ref 7.350–7.450)
PH ART: 7.47 — AB (ref 7.350–7.450)
PO2 ART: 60 mmHg — AB (ref 83.0–108.0)
PO2 ART: 49 mmHg — AB (ref 83.0–108.0)
Patient temperature: 37
RATE: 18 resp/min

## 2017-09-22 LAB — GLUCOSE, CAPILLARY
GLUCOSE-CAPILLARY: 90 mg/dL (ref 65–99)
GLUCOSE-CAPILLARY: 86 mg/dL (ref 65–99)
GLUCOSE-CAPILLARY: 107 mg/dL — AB (ref 65–99)
GLUCOSE-CAPILLARY: 84 mg/dL (ref 65–99)
GLUCOSE-CAPILLARY: 98 mg/dL (ref 65–99)

## 2017-09-22 LAB — COMPREHENSIVE METABOLIC PANEL
ALT: 25 U/L (ref 17–63)
ANION GAP: 3 — AB (ref 5–15)
AST: 53 U/L — ABNORMAL HIGH (ref 15–41)
Albumin: 2 g/dL — ABNORMAL LOW (ref 3.5–5.0)
Alkaline Phosphatase: 116 U/L (ref 38–126)
BUN: 14 mg/dL (ref 6–20)
CALCIUM: 7.8 mg/dL — AB (ref 8.9–10.3)
CHLORIDE: 94 mmol/L — AB (ref 101–111)
CO2: 39 mmol/L — AB (ref 22–32)
Creatinine, Ser: 1.69 mg/dL — ABNORMAL HIGH (ref 0.61–1.24)
GFR calc non Af Amer: 50 mL/min — ABNORMAL LOW (ref >60)
GFR, EST AFRICAN AMERICAN: 58 mL/min — AB (ref >60)
Glucose, Bld: 92 mg/dL (ref 65–99)
POTASSIUM: 4.7 mmol/L (ref 3.5–5.1)
SODIUM: 136 mmol/L (ref 135–145)
Total Bilirubin: 0.8 mg/dL (ref 0.3–1.2)
Total Protein: 9.8 g/dL — ABNORMAL HIGH (ref 6.5–8.1)

## 2017-09-22 LAB — TRIGLYCERIDES: Triglycerides: 103 mg/dL (ref <150)

## 2017-09-22 LAB — AMMONIA: Ammonia: 55 umol/L — ABNORMAL HIGH (ref 9–35)

## 2017-09-22 MED ORDER — VECURONIUM BROMIDE 10 MG IV SOLR
INTRAVENOUS | Status: AC
  Administered 2017-09-22: 20 mg via INTRAVENOUS
  Filled 2017-09-22: qty 20

## 2017-09-22 MED ORDER — FENTANYL CITRATE (PF) 100 MCG/2ML IJ SOLN
200.0000 ug | Freq: Once | INTRAMUSCULAR | Status: AC
  Administered 2017-09-22: 200 ug via INTRAVENOUS

## 2017-09-22 MED ORDER — CHLORHEXIDINE GLUCONATE 0.12% ORAL RINSE (MEDLINE KIT)
15.0000 mL | Freq: Two times a day (BID) | OROMUCOSAL | Status: DC
  Administered 2017-09-22 – 2017-09-23 (×2): 15 mL via OROMUCOSAL

## 2017-09-22 MED ORDER — MIDAZOLAM HCL 2 MG/2ML IJ SOLN
4.0000 mg | Freq: Once | INTRAMUSCULAR | Status: AC
  Administered 2017-09-22: 4 mg via INTRAVENOUS

## 2017-09-22 MED ORDER — ORAL CARE MOUTH RINSE
15.0000 mL | OROMUCOSAL | Status: DC
  Administered 2017-09-22 – 2017-09-23 (×10): 15 mL via OROMUCOSAL

## 2017-09-22 MED ORDER — LEVETIRACETAM 100 MG/ML PO SOLN
1500.0000 mg | Freq: Two times a day (BID) | ORAL | Status: DC
  Filled 2017-09-22: qty 15

## 2017-09-22 MED ORDER — SPIRONOLACTONE 25 MG PO TABS
50.0000 mg | ORAL_TABLET | Freq: Two times a day (BID) | ORAL | Status: DC
  Administered 2017-09-23: 50 mg
  Filled 2017-09-22: qty 2

## 2017-09-22 MED ORDER — VECURONIUM BROMIDE 10 MG IV SOLR
10.0000 mg | INTRAVENOUS | Status: DC | PRN
  Administered 2017-09-22 (×2): 10 mg via INTRAVENOUS
  Filled 2017-09-22 (×2): qty 10

## 2017-09-22 MED ORDER — MIDAZOLAM HCL 2 MG/2ML IJ SOLN
INTRAMUSCULAR | Status: AC
  Administered 2017-09-22: 4 mg via INTRAVENOUS
  Filled 2017-09-22: qty 4

## 2017-09-22 MED ORDER — PROPOFOL 1000 MG/100ML IV EMUL
0.0000 ug/kg/min | INTRAVENOUS | Status: DC
  Administered 2017-09-22: 30 ug/kg/min via INTRAVENOUS
  Administered 2017-09-22 – 2017-09-23 (×8): 40 ug/kg/min via INTRAVENOUS
  Filled 2017-09-22 (×2): qty 100
  Filled 2017-09-22: qty 200
  Filled 2017-09-22: qty 100
  Filled 2017-09-22 (×2): qty 200
  Filled 2017-09-22: qty 100

## 2017-09-22 MED ORDER — FENTANYL CITRATE (PF) 100 MCG/2ML IJ SOLN
INTRAMUSCULAR | Status: AC
  Administered 2017-09-22: 200 ug via INTRAVENOUS
  Filled 2017-09-22: qty 4

## 2017-09-22 MED ORDER — VECURONIUM BROMIDE 10 MG IV SOLR
20.0000 mg | Freq: Once | INTRAVENOUS | Status: AC
  Administered 2017-09-22: 20 mg via INTRAVENOUS

## 2017-09-22 MED ORDER — PANTOPRAZOLE SODIUM 40 MG IV SOLR
40.0000 mg | INTRAVENOUS | Status: DC
  Administered 2017-09-22: 40 mg via INTRAVENOUS
  Filled 2017-09-22: qty 40

## 2017-09-22 MED ORDER — LEVETIRACETAM 100 MG/ML PO SOLN
1500.0000 mg | Freq: Two times a day (BID) | ORAL | Status: DC
  Administered 2017-09-22 – 2017-09-23 (×2): 1500 mg
  Filled 2017-09-22 (×3): qty 15

## 2017-09-22 MED ORDER — FENTANYL 2500MCG IN NS 250ML (10MCG/ML) PREMIX INFUSION
25.0000 ug/h | INTRAVENOUS | Status: DC
  Administered 2017-09-22: 100 ug/h via INTRAVENOUS
  Administered 2017-09-23 (×2): 200 ug/h via INTRAVENOUS
  Filled 2017-09-22 (×3): qty 250

## 2017-09-22 MED ORDER — LACTULOSE 10 GM/15ML PO SOLN
30.0000 g | Freq: Three times a day (TID) | ORAL | Status: DC
  Administered 2017-09-23: 30 g
  Filled 2017-09-22: qty 60

## 2017-09-22 MED ORDER — MIDAZOLAM HCL 2 MG/2ML IJ SOLN
4.0000 mg | INTRAMUSCULAR | Status: DC | PRN
  Administered 2017-09-22 (×2): 4 mg via INTRAVENOUS
  Filled 2017-09-22 (×2): qty 4

## 2017-09-22 MED ORDER — FENTANYL CITRATE (PF) 100 MCG/2ML IJ SOLN: 50.0000 ug | Freq: Once | INTRAMUSCULAR | Status: DC

## 2017-09-22 MED ORDER — PROPOFOL 1000 MG/100ML IV EMUL
INTRAVENOUS | Status: AC
  Administered 2017-09-22: 30 ug/kg/min via INTRAVENOUS
  Filled 2017-09-22: qty 100

## 2017-09-22 MED ORDER — FENTANYL BOLUS VIA INFUSION
50.0000 ug | INTRAVENOUS | Status: DC | PRN
  Filled 2017-09-22: qty 50

## 2017-09-22 MED ORDER — LOSARTAN POTASSIUM 25 MG PO TABS: 25.0000 mg | ORAL_TABLET | Freq: Every day | ORAL | Status: DC

## 2017-09-22 MED ORDER — NOREPINEPHRINE BITARTRATE 1 MG/ML IV SOLN
0.0000 ug/min | INTRAVENOUS | Status: DC
  Administered 2017-09-22: 5 ug/min via INTRAVENOUS
  Administered 2017-09-23: 8 ug/min via INTRAVENOUS
  Filled 2017-09-22 (×2): qty 16

## 2017-09-22 MED ORDER — SULFAMETHOXAZOLE-TRIMETHOPRIM 200-40 MG/5ML PO SUSP
20.0000 mL | Freq: Every day | ORAL | Status: DC
  Administered 2017-09-23: 20 mL via ORAL
  Filled 2017-09-22 (×2): qty 20

## 2017-09-22 MED ORDER — STERILE WATER FOR INJECTION IJ SOLN
INTRAMUSCULAR | Status: AC
Start: 2017-09-22 — End: 2017-09-22
  Administered 2017-09-22: 20 mL
  Filled 2017-09-22: qty 20

## 2017-09-22 MED ORDER — FUROSEMIDE 20 MG PO TABS: 40.0000 mg | ORAL_TABLET | Freq: Every day | ORAL | Status: DC

## 2017-09-22 MED ORDER — CLOZAPINE 100 MG PO TABS
100.0000 mg | ORAL_TABLET | Freq: Every day | ORAL | Status: DC
  Filled 2017-09-22: qty 1

## 2017-09-22 NOTE — Progress Notes (Addendum)
When RN went into pt.'s room to assess him at 0730, RN checked O2 and was 78 % on CPAP, pt has an order for BIPAP. RN switched pt to a nonrebreather at 0735 and O2 went to 92-93 %. Pt responded to voice, speech is slurred, and pt is disorientated to situation and time, pt is visibly sweating- CBG checked and was 86. Prime doctor was notified of new findings. New orders placed by prime doctor, respiratory called to help place pt on BIPAP at this time. Will continue to monitor pt closely.   Lanaysia Fritchman [place]

## 2017-09-22 NOTE — Significant Event (Signed)
Rapid Response Event Note  Overview: Time Called: 0418 Arrival Time: 0420 Event Type: Respiratory  Initial Focused Assessment: pt in bed, eyes closed. Pt awakened to voice. Initially had decreased O2 sat after changing from CPAP to non rebreather.    Interventions: pt awakened and back at baseline per bedside RN.   Plan of Care (if not transferred): Will stay in room and monitor   Event Summary:   at      at    Outcome: Stayed in room and stabalized     Howard [name]

## 2017-09-22 NOTE — Progress Notes (Signed)
Per Dr. Belia HemanKasa, OG tube ok to use.

## 2017-09-22 NOTE — Progress Notes (Signed)
[facility] [place] [facility]   PATIENT NAME: Howard [name]    MR#:  [id number]  DATE OF BIRTH:  09/02/1981  SUBJECTIVE:  This morning patient was found to be hypoxic and lethargic.  Rapid response was called.  Saw the patient placed on BiPAP and transferred to ICU where he is being intubated.  He is hypotensive REVIEW OF SYSTEMS:   Review of Systems  Unable to perform ROS: Mental status change   Tolerating Diet:yesTolerating BJ:YNWGNPT:rehab DRUG ALLERGIES:   Allergies  Allergen Reactions  . Tramadol Itching    VITALS:  Blood pressure (!) 94/45, pulse (!) 105, temperature 98.9 F (37.2 C), temperature source Axillary, resp. rate 20, height 6\' 3"  (1.905 m), weight (!) 144.8 kg (319 lb 3.6 oz), SpO2 (!) 89 %. PHYSICAL EXAMINATION:   Physical Exam  GENERAL:  37 y.o.-year-old patient lying in the bed with no acute distress. Obese-morbidly EYES: Pupils equal, round, reactive to light and accommodation. No scleral icterus. Extraocular muscles intact.  HEENT: Head atraumatic, normocephalic. Oropharynx and nasopharynx clear.  NECK:  Supple, no jugular venous distention. No thyroid enlargement, no tenderness.  LUNGS: Decreased breath sounds bilaterally, no wheezing, rales, rhonchi. Use of accessory muscles of respiration +.  CARDIOVASCULAR: S1, S2 normal. No murmurs, rubs, or gallops.  ABDOMEN: Soft, nontender, distended. Bowel sounds present. No organomegaly or mass. EXTREMITIES: No cyanosis, clubbing or edema b/l.    NEUROLOGIC:lethargic, non-focal PSYCHIATRIC:  patient is lethargic, disoriented SKIN: No obvious rash, lesion, or ulcer.   LABORATORY PANEL:  CBC Recent Labs  Lab 09/22/17 0624  WBC 9.1  HGB 10.0*  HCT 32.0*  PLT 114*    Chemistries  Recent Labs  Lab 09/22/17 0624  NA 136  K 4.7  CL 94*  CO2 39*  GLUCOSE 92  BUN 14  CREATININE 1.69*  CALCIUM 7.8*  AST 53*  ALT 25  ALKPHOS 116  BILITOT 0.8   Cardiac Enzymes No results for  input(s): TROPONINI in the last 168 hours. RADIOLOGY:  Dg Chest Port 1 View  Result Date: 09/22/2017 CLINICAL DATA:  37 year old male with a history of endotracheal tube placement EXAM: PORTABLE CHEST 1 VIEW COMPARISON:  09/19/2017, 09/16/2017 FINDINGS: Cardiomediastinal silhouette likely unchanged with leftward rotation of the patient. Cardiomegaly. Opacity the right hilar region with low lung volumes and patchy interstitial/ airspace opacity at the right base. Retrocardiac opacity. No evidence of pneumothorax. Interval placement of endotracheal tube, which terminates approximately 4.8 cm above the carina. Interval placement of gastric tube which terminates out of the field of view. The side port appears to terminate above the GE junction. Interval placement of left IJ central venous catheter with the tip appearing to terminate in the left brachiocephalic vein. IMPRESSION: Interval placement of endotracheal tube which terminates suitably above the carina. Interval placement of gastric tube with the side-port appearing to be above the GE junction. Advancing the tube advised for better positioning. Interval placement of left IJ central venous catheter, with the tip appearing to terminate in the left brachiocephalic vein. Low lung volumes with bilateral basilar opacities, potentially a combination of edema, atelectasis/ consolidation. Electronically Signed   By: Gilmer MorJaime  Wagner D.O.   On: 09/22/2017 12:00   Dg Abd Portable 1v  Result Date: 09/22/2017 CLINICAL DATA:  Enteric tube placement EXAM: PORTABLE ABDOMEN - 1 VIEW COMPARISON:  09/13/2017 abdominal radiographs FINDINGS: Enteric tube enters the stomach, with the tip not seen on this image. Gasless abdomen without appreciable bowel dilatation. Left retrocardiac lung  opacity. No evidence of pneumatosis or pneumoperitoneum in the upper abdomen. IMPRESSION: 1. Enteric tube enters the stomach, with the tip not seen on this image. 2. Left retrocardiac lung opacity,  either atelectasis, aspiration, pneumonia and/or small left pleural effusion . 3. Gasless abdomen without appreciable bowel dilatation. Electronically Signed   By: Delbert Phenix M.D.   On: 09/22/2017 12:01   ASSESSMENT AND PLAN:    1 Acute respiratory failure with hypoxia and hypercapnic.  -Had a rapid response this morning.  He is very lethargic, now requiring BiPAP to maintain oxygen saturations -Because the case with ICU Dr. Belia Heman - will likely need intubation   2acute possible left sided pneumonia Completed Rocephin/azithromycin   3. Sepsis due to acute SBP -Treated Rocephin as stated above, paracentesis cultures are negative so far. -on chronic SBP prophylaxis with Bactrim  4acute hepatic encephalopathy Secondary to alcoholic cirrhosis with associated ascites Lactulose 3 times daily.   5chronic alcoholic liver cirrhosis associated chronic thrombocytopenia, esophageal varices, recurrent episodes of hepatic encephalopathy, electrolyte abnormalities, and ascites -Cont rifaximin, continue spironolactone and Lasix. -DT.  Improved. -CIWA protocol. Off precedex iv, off banana bag iv.   6.Ascites, recurrent -successful ultrasound-guided paracentesis yielding 1.3 liters of peritoneal fluid.  6history of seizures Continue Keppra  7chronic schizophrenia Stable  8.Generalized weakness.  PT recommends rehab - patient refused  Is critically sick and high risk for cardiorespiratory failure he will likely require intubation.  We will transfer the patient to ICU/stepdown, case discussed with intensivist Dr. Belia Heman   Case discussed with Care Management/Social Worker. Management plans discussed with the patient, nursing and they are in agreement.  CODE STATUS: full   TOTAL TIME (CRITICAL CARE) TAKING CARE OF THIS PATIENT: 35 minutes.  >50% time spent on counselling and coordination of care   Note: This dictation was prepared with Dragon dictation along with smaller phrase  technology. Any transcriptional errors that result from this process are unintentional.  Delfino Lovett M.D on 09/22/2017 at 2:28 PM  Between 7am to 6pm - Pager - [phone]  After 6pm go to [email or web address] - password [email or web address] [facility]  [facility] [place] [facility]  Office  [phone]  CC: Primary care physician; [name], NPPatient ID: Howard [name], male   DOB: 12/10/1980, 37 y.o.   MRN: [id number]

## 2017-09-22 NOTE — Progress Notes (Signed)
Spo2 drops with high levels of PEEP. PEEP decreased from 15 to 8. Spo2 improved from 86 to 96%. Dr. Belia HemanKasa notified

## 2017-09-22 NOTE — Procedures (Signed)
Endotracheal Intubation: Patient required placement of an artificial airway secondary to Respiratory Failure  Consent: Emergent.   Hand washing performed prior to starting the procedure.   Medications administered for sedation prior to procedure:  Midazolam 4 mg IV,  Vecuronium 10 mg IV, Fentanyl 100 mcg IV.    A time out procedure was called and correct patient, name, & ID confirmed. Needed supplies and equipment were assembled and checked to include ETT, 10 ml syringe, Glidescope, Mac and Miller blades, suction, oxygen and bag mask valve, end tidal CO2 monitor.   Patient was positioned to align the mouth and pharynx to facilitate visualization of the glottis.   Heart rate, SpO2 and blood pressure was continuously monitored during the procedure. Pre-oxygenation was conducted prior to intubation and endotracheal tube was placed through the vocal cords into the trachea.     The artificial airway was placed under direct visualization via glidescope route using a 8.0 ETT on the first attempt.  ETT was secured at 23 cm mark.  Placement was confirmed by auscuitation of lungs with good breath sounds bilaterally and no stomach sounds.  Condensation was noted on endotracheal tube.   Pulse ox 98%.  CO2 detector in place with appropriate color change.   Complications: None .   Operator: Karizma Cheek.   Chest radiograph ordered and pending.   Comments: OGT placed via glidescope.  Janel Beane David Allena Pietila, M.D.  [place] [facility]  Medical Director [facility] [place] Medical Director [facility] Department       

## 2017-09-22 NOTE — Care Management (Signed)
Patient transferred back to icu from [facility] for hypoxia, lethargy and hypotension 1.1.2019.  Intubated. Propofol, Levophed

## 2017-09-22 NOTE — Progress Notes (Signed)
PT Cancellation Note  Patient Details Name: Howard [name] MRN: [id number] DOB: [birth year]   Cancelled Treatment:    Reason Eval/Treat Not Completed: Medical issues which prohibited therapy(Per chart review, patient transferred to CCU due to respiratory failure, decline in medical status; now intubated.  Per policy, will require new orders to resume PT services due to change in status/transfer to higher level of care.  Initial order completed; please re-order as medically appropriate.)  Latina Frank H. Manson PasseyBrown, PT, DPT, NCS 09/22/17, 9:04 PM [phone]

## 2017-09-22 NOTE — Progress Notes (Signed)
Pt transported with BIPAP with RT and RN to ICU bed 9. VSS and pt is still lethargic and responds to voice. All pt belongings were transported with pt. RN attempted to call pt.'s mother but received no answer. Report given to receiving to nurse.   Aqsa Sensabaugh [employer]

## 2017-09-22 NOTE — Progress Notes (Signed)
Called Rapid Response on patient due to decreasing O2 sats -66% while on bipap.  NT took vital signs and patient became disoriented with delayed responses. Patient put on non rebreather at 6L and oxgen went up to 94%,  BP 127/57.  Respiratory therapy put patient back on bipap and increased levels on machine.  Patient is not working as hard to breath and is resting and oriented.  Will continue to monitor and check O2 sats.  Howard [name], Howard [name]  09/22/2017  4:49 AM

## 2017-09-22 NOTE — Procedures (Signed)
Central Venous Catheter Placement:TRIPLE LUMEN Indication: Patient receiving vesicant or irritant drug.; Patient receiving intravenous therapy for longer than 5 days.; Patient has limited or no vascular access.   Consent:emergent    Hand washing performed prior to starting the procedure.   Procedure:   An active timeout was performed and correct patient, name, & ID confirmed.   Patient was positioned correctly for central venous access.  Patient was prepped using strict sterile technique including chlorohexadine preps, sterile drape, sterile gown and sterile gloves.    The area was prepped, draped and anesthetized in the usual sterile manner. Patient comfort was obtained.    A triple lumen catheter was placed in LEFT Internal Jugular Vein There was good blood return, catheter caps were placed on lumens, catheter flushed easily, the line was secured and a sterile dressing and BIO-PATCH applied.   Ultrasound was used to visualize vasculature and guidance of needle.   Number of Attempts: 1 Complications:none Estimated Blood Loss: none Chest Radiograph indicated and ordered.  Operator: Ayaan Shutes.   Renuka Farfan David Carolyn Sylvia, M.D.  [facility]  Medical Director [facility]  Medical Director [facility] Cardio-Pulmonary Department     

## 2017-09-22 NOTE — Progress Notes (Signed)
CC  follow up SOB +resp distress  HPI Acute resp failure +unresponsive +increased WOB Sudden change in resp status, acute hypoxia High risk for intubation  Vitals:   09/22/17 0745 09/22/17 0900 09/22/17 0951 09/22/17 1014  BP:   107/86 (!) 134/56  Pulse:   (!) 108 (!) 106  Resp:   (!) 23 (!) 27  Temp:   98.2 F (36.8 C) 98.9 F (37.2 C)  TempSrc:   Axillary Axillary  SpO2: 92% (!) 87% 96% 94%  Weight:    (!) 319 lb 3.6 oz (144.8 kg)  Height:    6\' 3"  (1.905 m)  [place] 4 LPM   PHYSICAL EXAMINATION:  GENERAL:critically ill appearing, +resp distress HEAD: Normocephalic, atraumatic.  EYES: Pupils equal, round, reactive to light.  No scleral icterus.  MOUTH: Moist mucosal membrane. NECK: Supple. No thyromegaly. No nodules. No JVD.  PULMONARY: +rhonchi, +wheezing CARDIOVASCULAR: S1 and S2. Regular rate and rhythm. No murmurs, rubs, or gallops.  GASTROINTESTINAL: Soft, nontender, -distended. No masses. Positive bowel sounds. No hepatosplenomegaly.  MUSCULOSKELETAL: No swelling, clubbing, or edema.  NEUROLOGIC: obtunded    BMET BMP Latest Ref Rng & Units 09/22/2017 09/19/2017 09/18/2017  Glucose 65 - 99 mg/dL 92 86 88  BUN 6 - 20 mg/dL 14 16 18   Creatinine 0.61 - 1.24 mg/dL 8.46(N1.69(H) 6.290.77 5.280.88  Sodium 135 - 145 mmol/L 136 137 136  Potassium 3.5 - 5.1 mmol/L 4.7 3.9 4.4  Chloride 101 - 111 mmol/L 94(L) 98(L) 101  CO2 22 - 32 mmol/L 39(H) 36(H) 33(H)  Calcium 8.9 - 10.3 mg/dL 7.8(L) 7.9(L) 7.7(L)    CBC Latest Ref Rng & Units 09/22/2017 09/21/2017 09/18/2017  WBC 3.8 - 10.6 K/uL 9.1 7.5 10.3  Hemoglobin 13.0 - 18.0 g/dL 10.0(L) 10.0(L) 10.0(L)  Hematocrit 40.0 - 52.0 % 32.0(L) 31.8(L) 32.6(L)  Platelets 150 - 440 K/uL 114(L) 112(L) 96(L)     CXR: No new film IMPRESSION: Acute (likely on chronic) hypoxemic/hypercarbic respiratory failure-on biPAP Related to encephalopathy and aspiration pneumonitis Suspected right pleural effusion -not much fluid noted on ultrasound  12/27 Alcoholic cirrhosis Seizure disorder Schizophrenia Spontaneous bacterial peritonitis -completed 5 days ceftriaxone Type 2 diabetes, controlled Mild thrombocytopenia  PLAN/REC: BiPAP as needed, obtain ABG High risk for intubation Overall prognosis is poor   Critical Care Time devoted to patient care services described in this note is 45 minutes.   Overall, patient is critically ill, prognosis is guarded.  Patient with Multiorgan failure and at high risk for cardiac arrest and death.    Lucie LeatherKurian David Sharrod Achille, M.D.  [facility]  Medical Director [facility] Medical Director [facility] Card[facility]

## 2017-09-22 NOTE — Progress Notes (Signed)
No patient education provided this shift due to emergent intubation. Pt ventilated and sedated at this time. Unable to reach any family to provide updates.

## 2017-09-22 DEATH — deceased

## 2017-09-23 ENCOUNTER — Inpatient Hospital Stay: Payer: Medicare Other

## 2017-09-23 DIAGNOSIS — R188 Other ascites: Secondary | ICD-10-CM

## 2017-09-23 DIAGNOSIS — K7011 Alcoholic hepatitis with ascites: Secondary | ICD-10-CM

## 2017-09-23 DIAGNOSIS — J9601 Acute respiratory failure with hypoxia: Secondary | ICD-10-CM

## 2017-09-23 DIAGNOSIS — A419 Sepsis, unspecified organism: Principal | ICD-10-CM

## 2017-09-23 LAB — RENAL FUNCTION PANEL
ALBUMIN: 1.7 g/dL — AB (ref 3.5–5.0)
Anion gap: 5 (ref 5–15)
BUN: 22 mg/dL — AB (ref 6–20)
CALCIUM: 7.8 mg/dL — AB (ref 8.9–10.3)
CO2: 36 mmol/L — ABNORMAL HIGH (ref 22–32)
CREATININE: 2.36 mg/dL — AB (ref 0.61–1.24)
Chloride: 94 mmol/L — ABNORMAL LOW (ref 101–111)
GFR calc Af Amer: 39 mL/min — ABNORMAL LOW (ref >60)
GFR, EST NON AFRICAN AMERICAN: 33 mL/min — AB (ref >60)
GLUCOSE: 86 mg/dL (ref 65–99)
PHOSPHORUS: 3.7 mg/dL (ref 2.5–4.6)
Potassium: 3.5 mmol/L (ref 3.5–5.1)
SODIUM: 135 mmol/L (ref 135–145)

## 2017-09-23 LAB — BASIC METABOLIC PANEL
ANION GAP: 6 (ref 5–15)
BUN: 23 mg/dL — ABNORMAL HIGH (ref 6–20)
CALCIUM: 7.7 mg/dL — AB (ref 8.9–10.3)
CO2: 35 mmol/L — AB (ref 22–32)
Chloride: 94 mmol/L — ABNORMAL LOW (ref 101–111)
Creatinine, Ser: 2.38 mg/dL — ABNORMAL HIGH (ref 0.61–1.24)
GFR, EST AFRICAN AMERICAN: 38 mL/min — AB (ref >60)
GFR, EST NON AFRICAN AMERICAN: 33 mL/min — AB (ref >60)
Glucose, Bld: 87 mg/dL (ref 65–99)
Potassium: 3.5 mmol/L (ref 3.5–5.1)
SODIUM: 135 mmol/L (ref 135–145)

## 2017-09-23 LAB — CBC WITH DIFFERENTIAL/PLATELET
Basophils Absolute: 0.1 10*3/uL (ref 0–0.1)
Basophils Relative: 1 %
EOS PCT: 3 %
Eosinophils Absolute: 0.4 10*3/uL (ref 0–0.7)
HCT: 32.2 % — ABNORMAL LOW (ref 40.0–52.0)
Hemoglobin: 10 g/dL — ABNORMAL LOW (ref 13.0–18.0)
Lymphocytes Relative: 11 %
Lymphs Abs: 1.3 10*3/uL (ref 1.0–3.6)
MCH: 26.2 pg (ref 26.0–34.0)
MCHC: 31.1 g/dL — AB (ref 32.0–36.0)
MCV: 84.2 fL (ref 80.0–100.0)
MONO ABS: 1.5 10*3/uL — AB (ref 0.2–1.0)
MONOS PCT: 13 %
Neutro Abs: 8.5 10*3/uL — ABNORMAL HIGH (ref 1.4–6.5)
Neutrophils Relative %: 72 %
PLATELETS: 134 10*3/uL — AB (ref 150–440)
RBC: 3.82 MIL/uL — ABNORMAL LOW (ref 4.40–5.90)
RDW: 18.8 % — AB (ref 11.5–14.5)
WBC: 11.8 10*3/uL — ABNORMAL HIGH (ref 3.8–10.6)

## 2017-09-23 LAB — GLUCOSE, CAPILLARY
GLUCOSE-CAPILLARY: 84 mg/dL (ref 65–99)
Glucose-Capillary: 97 mg/dL (ref 65–99)

## 2017-09-23 LAB — MAGNESIUM: MAGNESIUM: 1.3 mg/dL — AB (ref 1.7–2.4)

## 2017-09-23 LAB — LACTIC ACID, PLASMA: Lactic Acid, Venous: 1.7 mmol/L (ref 0.5–1.9)

## 2017-09-23 LAB — PHOSPHORUS: PHOSPHORUS: 4.2 mg/dL (ref 2.5–4.6)

## 2017-09-23 MED ORDER — MORPHINE BOLUS VIA INFUSION
5.0000 mg | INTRAVENOUS | Status: DC | PRN
  Filled 2017-09-23: qty 20

## 2017-09-23 MED ORDER — VITAL HIGH PROTEIN PO LIQD
1000.0000 mL | ORAL | Status: DC
  Administered 2017-09-23: 1000 mL

## 2017-09-23 MED ORDER — SODIUM CHLORIDE 0.9 % IV SOLN
10.0000 mg/h | INTRAVENOUS | Status: DC
  Administered 2017-09-23 (×3): 10 mg/h via INTRAVENOUS
  Filled 2017-09-23: qty 10

## 2017-09-23 MED ORDER — PRO-STAT SUGAR FREE PO LIQD
60.0000 mL | Freq: Every day | ORAL | Status: DC
  Administered 2017-09-23: 60 mL

## 2017-09-23 MED ORDER — FUROSEMIDE 10 MG/ML IJ SOLN
40.0000 mg | Freq: Three times a day (TID) | INTRAMUSCULAR | Status: DC
  Administered 2017-09-23 (×2): 40 mg via INTRAVENOUS
  Filled 2017-09-23 (×2): qty 4

## 2017-09-23 MED ORDER — BUDESONIDE 0.5 MG/2ML IN SUSP
0.5000 mg | Freq: Two times a day (BID) | RESPIRATORY_TRACT | Status: DC
  Administered 2017-09-23: 0.5 mg via RESPIRATORY_TRACT
  Filled 2017-09-23: qty 2

## 2017-09-23 MED ORDER — SODIUM CHLORIDE 0.9 % IV SOLN
3.0000 g | Freq: Four times a day (QID) | INTRAVENOUS | Status: DC
  Administered 2017-09-23: 3 g via INTRAVENOUS
  Filled 2017-09-23 (×3): qty 3

## 2017-09-23 MED ORDER — IPRATROPIUM-ALBUTEROL 0.5-2.5 (3) MG/3ML IN SOLN
3.0000 mL | RESPIRATORY_TRACT | Status: DC
  Administered 2017-09-23 (×2): 3 mL via RESPIRATORY_TRACT
  Filled 2017-09-23 (×2): qty 3

## 2017-09-23 MED ORDER — HYDROCORTISONE NA SUCCINATE PF 100 MG IJ SOLR
50.0000 mg | Freq: Four times a day (QID) | INTRAMUSCULAR | Status: DC
  Administered 2017-09-23: 50 mg via INTRAVENOUS
  Filled 2017-09-23: qty 2

## 2017-09-25 ENCOUNTER — Telehealth: Payer: Self-pay

## 2017-09-25 NOTE — Telephone Encounter (Signed)
Documentation entered on wrong patient.  Please disregard.

## 2017-09-25 NOTE — Telephone Encounter (Signed)
Howard MayoSarah  [name] from the [facility] called and needs to ask 1 follow up question.  [phone]

## 2017-10-05 ENCOUNTER — Telehealth: Payer: Self-pay | Admitting: Internal Medicine

## 2017-10-05 NOTE — Telephone Encounter (Signed)
[facility]

## 2017-10-05 NOTE — Telephone Encounter (Signed)
[employer] dropped off Death Certificate to be signed Placed in Nurse Box

## 2017-10-06 NOTE — Telephone Encounter (Signed)
Spoke with April at [employer] to inform her death cert faxed per their request and that it is ready for pick up. Nothing further needed.

## 2017-10-23 NOTE — Consult Note (Signed)
Consultation Note Date: 10/04/17   Patient Name: Howard [name]  DOB: July 31, 1981  MRN: [id number]  Age / Sex: 37 y.o., male  PCP: Howard College, NP Referring Physician: Max Sane, MD  Reason for Consultation: Establishing goals of care  HPI/Patient Profile: 37 y.o. male  with past medical history of advanced cirrhosis with varices and portal hypertensive gastropathy, seizures, schizophrenia, COPD, atrial fibrillation and diabetes mellitus who was admitted on 08/28/2017 with sepsis secondary to pneumonia and SBP.  He has remained encephalopathic despite lactulose.  He is intubated, on propofol, fentanyl and levophed. He is coagulopathic with an INR of 1.73.   Kidneys are failing and CRRT is being considered and his urine output is minimal.  Of note the patient was on [facility] prior to admission.  Per Case Management and the Hospice office he was a DNR prior to admission.  He has had 9 admissions in the last 6 months - most are related to alcoholic cirrhosis and GI bleeding.  He has truly suffered with his severe alcohol addiction.   Clinical Assessment and Goals of Care:  I have reviewed medical records including EPIC notes, labs and imaging, received report from the CCM team including Dr. Mortimer Fries, bedside RN and Case Management, assessed the patient and then attempted to call his mother, Howard [name].  The one number listed on face sheet lead to a network recording that stated the person is not accepting calls right now.  As far as functional and nutritional status his albumin is 1.7.  I'm unable to assess other wise as he is sedated.   Primary Decision Maker:  OTHER - my understanding is that the patient's mother is his primary support.  Unfortunately she was reported not to be sober yesterday and unable to make decisions.  Today she is not reachable.   I'm very concerned that even if we are  able to speak with his mother she may be incapable of making rational decisions for her son.    SUMMARY OF RECOMMENDATIONS    The patient is in critical condition and in at high risk for death due to ESLD and respiratory failure.   He is appropriately a DNR (signed by Dr. Clemmie Krill of Hospice).  Unfortunately he was intubated before we knew he was a DNR.  He is not expected to survive this hospitalization.    Given his overall medical prognosis Palliative would not recommend any further escalation of care.   If family is truly unavailable I would recommend an ethics consult for consideration of GOC and possible shift to full comfort.   Code Status/Advance Care Planning:  DNR  Prognosis:  Days pending medical interventions administered.   Discharge Planning: Anticipated Hospital Death      Primary Diagnoses: Present on Admission: . SBP (spontaneous bacterial peritonitis) (Home)   I have reviewed the medical record, interviewed the patient and family, and examined the patient. The following aspects are pertinent.  Past Medical History:  Diagnosis Date  . Alcoholic cirrhosis of liver with ascites (  Howard [name])   . Alcoholism (Howard [name])   . Anemia   . Atrial fibrillation (Howard [name])   . COPD (chronic obstructive pulmonary disease) ([place])   . Depression   . Diabetes mellitus, type II (Howard [name])   . Esophageal varices (Howard [name])   . Hyperlipemia   . Hypertension   . Multiple thyroid nodules   . Portal hypertensive gastropathy ([name])   . Schizophrenia (Howard [name])   . Seizures (Howard [name])    Social History   Socioeconomic History  . Marital status: Single    Spouse name: None  . Number of children: None  . Years of education: None  . Highest education level: None  Social Needs  . Financial resource strain: None  . Food insecurity - worry: None  . Food insecurity - inability: None  . Transportation needs - medical: None  . Transportation needs - non-medical: None  Occupational History  . Occupation:  disabled  Tobacco Use  . Smoking status: Never Smoker  . Smokeless tobacco: Never Used  Substance and Sexual Activity  . Alcohol use: Yes    Alcohol/week: 0.0 oz    Comment: last drink 1 days ago (noted: 05/30/2017)  . Drug use: No  . Sexual activity: No    Birth control/protection: None  Other Topics Concern  . None  Social History Narrative   ** Merged History Encounter **       Family History  Problem Relation Age of Onset  . Heart disease Mother   . Hypertension Mother   . Hyperlipidemia Mother   . Stroke Father   . Heart attack Father   . Hypertension Father   . Heart disease Father   . Alcohol abuse Father   . Heart disease Brother    Scheduled Meds: . budesonide (PULMICORT) nebulizer solution  0.5 mg Nebulization BID  . chlorhexidine gluconate (MEDLINE KIT)  15 mL Mouth Rinse BID  . cloZAPine  100 mg Per Tube QHS  . fentaNYL (SUBLIMAZE) injection  50 mcg Intravenous Once  . ferrous sulfate  325 mg Oral Q breakfast  . hydrocortisone sod succinate (SOLU-CORTEF) inj  50 mg Intravenous Q6H  . insulin aspart  0-9 Units Subcutaneous TID WC  . ipratropium-albuterol  3 mL Nebulization Q4H  . lactulose  30 g Per Tube TID  . levETIRAcetam  1,500 mg Per Tube BID  . mouth rinse  15 mL Mouth Rinse 10 times per day  . pantoprazole (PROTONIX) IV  40 mg Intravenous Q24H  . rifaximin  550 mg Oral BID  . sodium chloride flush  3 mL Intravenous Q12H  . spironolactone  50 mg Per Tube BID  . sulfamethoxazole-trimethoprim  20 mL Oral Daily   Continuous Infusions: . fentaNYL infusion INTRAVENOUS 200 mcg/hr (05-Oct-2017 0800)  . norepinephrine (LEVOPHED) Adult infusion 8 mcg/min (05-Oct-2017 0800)  . propofol (DIPRIVAN) infusion 40.055 mcg/kg/min (2017/10/05 0800)   PRN Meds:.albuterol, fentaNYL, guaiFENesin-dextromethorphan, ipratropium-albuterol, midazolam, [DISCONTINUED] ondansetron **OR** ondansetron (ZOFRAN) IV, vecuronium Allergies  Allergen Reactions  . Tramadol Itching   Review  of Systems intubated  Physical Exam  Well developed chronically ill young appearing male, intubated and sedated. CV rrr resp intubated, no distress Abdomen obese, soft but distended, no caput medusa, or obvious telangectasias Ext 2+ edema bilaterally Skin scattered bruising.  Vital Signs: BP 99/63   Pulse 69   Temp 97.8 F (36.6 C) (Axillary)   Resp (!) 23   Ht 6' 3"  (1.905 m)   Wt (!) 144.8 kg (319 lb 3.6 oz)   SpO2 92%  BMI 39.90 kg/m  Pain Assessment: CPOT POSS *See Group Information*: 1-Acceptable,Awake and alert Pain Score: 0-No pain   SpO2: SpO2: 92 % O2 Device:SpO2: 92 % O2 Flow Rate: .O2 Flow Rate (L/min): 4 L/min  IO: Intake/output summary:   Intake/Output Summary (Last 24 hours) at 10-14-2017 1023 Last data filed at 10-14-2017 0800 Gross per 24 hour  Intake 1128.73 ml  Output 1165 ml  Net -36.27 ml    LBM: Last BM Date: 09/21/17 Baseline Weight: Weight: (!) 154.2 kg (340 lb) Most recent weight: Weight: (!) 144.8 kg (319 lb 3.6 oz)     Palliative Assessment/Data: 10%     Time In: 10:15 Time Out: 11:23 Time Total: 68 min. Greater than 50%  of this time was spent counseling and coordinating care related to the above assessment and plan.  Signed by: Florentina Jenny, PA-C Palliative Medicine Pager: [phone]  Please contact Palliative Medicine Team phone at [phone] for questions and concerns.  For individual provider: See Shea Evans

## 2017-10-23 NOTE — Progress Notes (Signed)
CH responded to an OR for End of Life. Pt is being transitioned to comfort care. Mother and brother are bedside. CH spent time with family as they spoke of the Pt in happier times. CH will inform On-Call CH for further care.    11-17-17 1600  Clinical Encounter Type  Visited With Patient;Patient and family together;Health care provider  Visit Type Initial;Spiritual support;Critical Care;Patient actively dying  Referral From Nurse  Consult/Referral To Chaplain  Spiritual Encounters  Spiritual Needs Prayer;Emotional

## 2017-10-23 NOTE — Progress Notes (Signed)
THE ICU received [facility] stating that patient is DNR.    It was signed by Dr. Joen LauraLaura Bliss of [facility] [phone].   Copy placed on patient's chart. Patient is now DNR.    No updated family contacts available for patient. We will send out law enforcement to retreive mother of patient.  Patient with multiorgan failure and at very high chance of dying.     Howard LeatherKurian David Izacc [name], M.D.  [facility]  Medical Director [facility] Medical Director [facility] Cardio[facility] Department

## 2017-10-23 NOTE — Progress Notes (Signed)
Vent setting was change, patient destated to 84% and maintaining. Elink Doc was called and updated, and vent setting was change back to original vent setting of peep 8, RR 18 and TV 550. Dr. Nicholos Johnsamachandran is okay with the old vent setting and made aware. Will continue to monitor patient.

## 2017-10-23 NOTE — Care Management (Signed)
RNCM has received fax from [facility] stating that patient is DNR.  It was signed by Dr. Joen LauraLaura Bliss of [facility] [phone]. Palliative PA and Dr. Belia HemanKasa updated. Copy placed on patient's chart. Patient is now DNR.  No updated family contacts available for patient. Per Merdis DelayBernice, "all the numbers have been disconnected that they have also".

## 2017-10-23 NOTE — Progress Notes (Signed)
   10/08/2017 1900  Clinical Encounter Type  Visited With Family;Patient not available  Visit Type Death  Referral From Nurse  Spiritual Encounters  Spiritual Needs Emotional;Grief support  CH paged to comfort grieving family; CH offered emotional and grief support; family indicated no funds for funeral; [facility] and gave to RN

## 2017-10-23 NOTE — Progress Notes (Signed)
I have updated Brother and mother of patient, grave prognosis with multiorgan failure.  They have consented and agreed to comfort care measures.   Family are satisfied with Plan of action and management. All questions answered  Lucie LeatherKurian David Wendi Lastra, M.D.  [facility]  Medical Director [facility] Medical Director [facility] Cardio[facility]

## 2017-10-23 NOTE — Progress Notes (Signed)
Pt extubated to comfort care per MD order.

## 2017-10-23 NOTE — Progress Notes (Signed)
[facility] - [place] [facility]   PATIENT NAME: Howard [name]    MR#:  [id number]  DATE OF BIRTH:  October 07, 1980  SUBJECTIVE:  Remains critically sick. Waiting for rest of family for comfort care decision per CCM REVIEW OF SYSTEMS:   Review of Systems  Unable to perform ROS: Mental status change   Tolerating Diet:yesTolerating JX:BJYNW DRUG ALLERGIES:   Allergies  Allergen Reactions  . Tramadol Itching    VITALS:  Blood pressure 107/64, pulse 92, temperature 97.8 F (36.6 C), temperature source Axillary, resp. rate 18, height 6\' 3"  (1.905 m), weight (!) 145 kg (319 lb 10.7 oz), SpO2 (!) 87 %. PHYSICAL EXAMINATION:   Physical Exam  GENERAL:  37 y.o.-year-old patient lying in the bed with no acute distress. Obese-morbidly EYES: Pupils equal, round, reactive to light and accommodation. No scleral icterus. Extraocular muscles intact.  HEENT: Head atraumatic, normocephalic. Oropharynx and nasopharynx clear. ETT in place NECK:  Supple, no jugular venous distention. No thyroid enlargement, no tenderness.  LUNGS: Decreased breath sounds bilaterally, no wheezing, rales, rhonchi. Use of accessory muscles of respiration +.  CARDIOVASCULAR: S1, S2 normal. No murmurs, rubs, or gallops.  ABDOMEN: Soft, nontender, distended. Bowel sounds present. No organomegaly or mass. EXTREMITIES: No cyanosis, clubbing or edema b/l.    NEUROLOGIC:Sedated on vent PSYCHIATRIC: sedated on vent SKIN: No obvious rash, lesion, or ulcer.  LABORATORY PANEL:  CBC Recent Labs  Lab Oct 17, 2017 0759  WBC 11.8*  HGB 10.0*  HCT 32.2*  PLT 134*    Chemistries  Recent Labs  Lab 09/22/17 0624 10/17/17 0759 10/17/17 1330  NA 136 135  135  --   K 4.7 3.5  3.5  --   CL 94* 94*  94*  --   CO2 39* 36*  35*  --   GLUCOSE 92 86  87  --   BUN 14 22*  23*  --   CREATININE 1.69* 2.36*  2.38*  --   CALCIUM 7.8* 7.8*  7.7*  --   MG  --   --  1.3*  AST 53*  --   --   ALT 25  --    --   ALKPHOS 116  --   --   BILITOT 0.8  --   --    Cardiac Enzymes No results for input(s): TROPONINI in the last 168 hours. RADIOLOGY:  Dg Chest 1 View  Result Date: 2017/10/17 CLINICAL DATA:  Follow-up pulmonary edema. EXAM: CHEST 1 VIEW COMPARISON:  Chest radiograph performed 09/22/2017 FINDINGS: The patient's endotracheal tube is seen ending 3-4 cm above the carina. An enteric tube is noted extending below the diaphragm. The lungs are hypoexpanded. Small bilateral pleural effusions are noted. Bilateral midlung airspace opacification raises concern for worsening pneumonia, though underlying pulmonary edema cannot be excluded. No pneumothorax is seen. The cardiomediastinal silhouette is mildly enlarged. No acute osseous abnormalities are identified. A left IJ line is noted ending about the proximal SVC. IMPRESSION: 1. Endotracheal tube seen ending 3-4 cm above the carina. 2. Lungs hypoexpanded. Bilateral midlung airspace opacification raises concern for worsening pneumonia, though underlying pulmonary edema cannot be excluded. 3. Small bilateral pleural effusions. 4. Mild cardiomegaly. Electronically Signed   By: Roanna Raider M.D.   On: 10-17-2017 01:13   Dg Chest Port 1 View  Result Date: 09/22/2017 CLINICAL DATA:  37 year old male with a history of endotracheal tube placement EXAM: PORTABLE CHEST 1 VIEW COMPARISON:  09/19/2017, 09/16/2017 FINDINGS: Cardiomediastinal silhouette likely unchanged with leftward  rotation of the patient. Cardiomegaly. Opacity the right hilar region with low lung volumes and patchy interstitial/ airspace opacity at the right base. Retrocardiac opacity. No evidence of pneumothorax. Interval placement of endotracheal tube, which terminates approximately 4.8 cm above the carina. Interval placement of gastric tube which terminates out of the field of view. The side port appears to terminate above the GE junction. Interval placement of left IJ central venous catheter with the  tip appearing to terminate in the left brachiocephalic vein. IMPRESSION: Interval placement of endotracheal tube which terminates suitably above the carina. Interval placement of gastric tube with the side-port appearing to be above the GE junction. Advancing the tube advised for better positioning. Interval placement of left IJ central venous catheter, with the tip appearing to terminate in the left brachiocephalic vein. Low lung volumes with bilateral basilar opacities, potentially a combination of edema, atelectasis/ consolidation. Electronically Signed   By: Gilmer MorJaime  Wagner D.O.   On: 09/22/2017 12:00   Dg Abd Portable 1v  Result Date: 09/22/2017 CLINICAL DATA:  Enteric tube placement EXAM: PORTABLE ABDOMEN - 1 VIEW COMPARISON:  09/13/2017 abdominal radiographs FINDINGS: Enteric tube enters the stomach, with the tip not seen on this image. Gasless abdomen without appreciable bowel dilatation. Left retrocardiac lung opacity. No evidence of pneumatosis or pneumoperitoneum in the upper abdomen. IMPRESSION: 1. Enteric tube enters the stomach, with the tip not seen on this image. 2. Left retrocardiac lung opacity, either atelectasis, aspiration, pneumonia and/or small left pleural effusion . 3. Gasless abdomen without appreciable bowel dilatation. Electronically Signed   By: Delbert PhenixJason A Poff M.D.   On: 09/22/2017 12:01   ASSESSMENT AND PLAN:   1 Acute respiratory failure with hypoxia and hypercapnic.    2acute possible left sided pneumonia  3. Sepsis due to acute SBP  4acute hepatic encephalopathy  5chronic alcoholic liver cirrhosis associated chronic thrombocytopenia, esophageal varices, recurrent episodes of hepatic encephalopathy, electrolyte abnormalities, and ascites  6.Ascites, recurrent  7history of seizures/chronic schizophrenia  8.Generalized weakness  This case with Dr. Belia HemanKasa, and patient's mother at bedside. waiting for other family members to arrive.  Likely terminal extubation  and comfort care planned for later today     Case discussed with Care Management/Social Worker. Management plans discussed with the patient, nursing, family and they are in agreement.  CODE STATUS: DNR   TOTAL TIME TAKING CARE OF THIS PATIENT: 15 minutes.  >50% time spent on counselling and coordination of care   Note: This dictation was prepared with Dragon dictation along with smaller phrase technology. Any transcriptional errors that result from this process are unintentional.  Delfino LovettVipul Achsah Mcquade M.D on 09/24/2017 at 6:14 PM  Between 7am to 6pm - Pager - [phone]  After 6pm go to [email or web address] - password [email or web address] [facility]  [facility]  Office  [phone]  CC: Primary care physician; Galen ManilaKennedy, [name], NPPatient ID: Howard [name], male   DOB: 1981-01-29, 37 y.o.   MRN: [id number]

## 2017-10-23 NOTE — Progress Notes (Signed)
eLink Physician-Brief Progress Note Patient Name: Howard [name] DOB: [birth year] MRN: [id number]   Date of Service  10/20/2017  HPI/Events of Note  Pt notes desats to high 80's on 100% FiO2 on vent and peep of 5. Earlier, pt was on peep of 15 with good sats.   eICU Interventions   increase peep to 12.  Increase diuresis for likely pulm edema.  CXR.         Howard [name] 10/19/2017, 12:55 AM

## 2017-10-23 NOTE — Care Management (Signed)
I have reached out to [employer] to assist us with reaching family member for patient's status.

## 2017-10-23 NOTE — Progress Notes (Signed)
Pt extubated and morphine gtt at 12.5. Pt appears comfortable at this time. Will continue to monitor and provide support to family as needed.

## 2017-10-23 NOTE — Progress Notes (Signed)
Unable to provide pt education this shift due to pt ventilated and sedated. No contact with family thus far.

## 2017-10-23 NOTE — Progress Notes (Signed)
Patient was made comfort care today and was on morphine drip at 15mg /hr. Patient time of death is 1934 pronounced by Annabelle Harmanana, NP. Family was present at time of death. Chaplain was called for family.

## 2017-10-23 NOTE — Progress Notes (Signed)
CC  follow up SOB +resp distress  HPI Acute resp failure +unresponsive +increased WOB Patient remains intubated, sedated On vent multiorgan failure Patient is dying   Vitals:   10/18/2017 0500 10/04/2017 0620 10/15/2017 0750 10/04/2017 0800  BP: 103/67 96/64  99/63  Pulse: 71 70 70 69  Resp: 18 18 (!) 21 (!) 23  Temp:    97.8 F (36.6 C)  TempSrc:    Axillary  SpO2: 91% 91% 93% 92%  Weight:      Height:      [place] 4 LPM Graham  CAN NOT PROVIDE ROS DUE TO CRITICAL ILLNESS  PHYSICAL EXAMINATION:  GENERAL:critically ill appearing, +resp distress HEAD: Normocephalic, atraumatic.  EYES: Pupils equal, round, reactive to light.  No scleral icterus.  MOUTH: Moist mucosal membrane. NECK: Supple. No thyromegaly. No nodules. No JVD.  PULMONARY: +rhonchi, +wheezing CARDIOVASCULAR: S1 and S2. Regular rate and rhythm. No murmurs, rubs, or gallops.  GASTROINTESTINAL: Soft, nontender, -distended. No masses. Positive bowel sounds. No hepatosplenomegaly.  MUSCULOSKELETAL: No swelling, clubbing, or edema.  NEUROLOGIC: obtunded   BMET BMP Latest Ref Rng & Units 10/02/2017 10/14/2017 09/22/2017  Glucose 65 - 99 mg/dL 86 87 92  BUN 6 - 20 mg/dL 16(X22(H) 09(U23(H) 14  Creatinine 0.61 - 1.24 mg/dL 0.45(W2.36(H) 0.98(J2.38(H) 1.91(Y1.69(H)  Sodium 135 - 145 mmol/L 135 135 136  Potassium 3.5 - 5.1 mmol/L 3.5 3.5 4.7  Chloride 101 - 111 mmol/L 94(L) 94(L) 94(L)  CO2 22 - 32 mmol/L 36(H) 35(H) 39(H)  Calcium 8.9 - 10.3 mg/dL 7.8(L) 7.7(L) 7.8(L)    CBC Latest Ref Rng & Units 09/29/2017 09/22/2017 09/21/2017  WBC 3.8 - 10.6 K/uL 11.8(H) 9.1 7.5  Hemoglobin 13.0 - 18.0 g/dL 10.0(L) 10.0(L) 10.0(L)  Hematocrit 40.0 - 52.0 % 32.2(L) 32.0(L) 31.8(L)  Platelets 150 - 440 K/uL 134(L) 114(L) 112(L)      IMPRESSION: 37 yo morbidly obese white male with liver cirrhosis complicated by hepatic encephalopathy now with severe acidosis and severe resp failure with progressive multiorgan failure and shock  Patient is dying and we can not get  hold of family   Acute (likely on chronic) hypoxemic/hypercarbic respiratory failure- Related to encephalopathy and aspiration pneumonitis Decompensated Alcoholic cirrhosis   PLAN/REC: 1.cotninue vent support 2.cotinue vasopressors 3.sedation-rass goal -2 4.contact mother asap! 5.progressive renal failure-will need emergent HD but can not get a hold of family Will NOT start CRRT unless family aware  Patient is DNR per previous records   Critical Care Time devoted to patient care services described in this note is 48 minutes.   Overall, patient is critically ill, prognosis is guarded.  Patient with Multiorgan failure and at high risk for cardiac arrest and death.    Lucie LeatherKurian David Matther Labell, M.D.  [facility]  Medical Director [facility] Medical Director [facility] Card[facility] Department

## 2017-10-23 NOTE — Progress Notes (Signed)
Initial Nutrition Assessment  DOCUMENTATION CODES:   Obesity unspecified  INTERVENTION:  RD received verbal consult for TF from CCM  Begin Vital High Protein at 1720mL/hr, Pro-stat 60mL 5 times daily  With current propofol rate of 34.528mL/hr (919 calories) provides 2399 calories (107% estimated needs), 192 gm protein, 401mL free water  NUTRITION DIAGNOSIS:   Inadequate oral intake related to inability to eat as evidenced by NPO status.  GOAL:   Provide needs based on ASPEN/SCCM guidelines  MONITOR:   I & O's, Labs, Weight trends, Vent status, Skin, TF tolerance  REASON FOR ASSESSMENT:   Ventilator    ASSESSMENT:   Howard [name] is a 37 yo male with PMH liver cirrhosis, hepatic encephalopathy, schizophrenia, presents with severe acidosis, severe respiratory failure, progressive multiorgan failure, and shock. In need of emergent HD but unable to get a hold of family.   RD is very familiar with patient. Currently intubated, unable to provide any history. No family available.  Patient is currently intubated on ventilator support MV: 10.1 L/min Temp (24hrs), Avg:98 F (36.7 C), Min:97.6 F (36.4 C), Max:98.8 F (37.1 C)  MAP: 75-77 Propofol: 34.8 ml/hr --> 919 calories   Labs reviewed:  BUN/Creatinine 22/2.36  Medications reviewed and include:  Iron, Insulin, Lactulose Fentanyl gtt, Levo gtt    Intake/Output Summary (Last 24 hours) at 10/10/2017 1305 Last data filed at 10/08/2017 0800 Gross per 24 hour  Intake 1128.73 ml  Output 1165 ml  Net -36.27 ml  5.7L Fluid Negative   NUTRITION - FOCUSED PHYSICAL EXAM:    Most Recent Value  Orbital Region  No depletion  Upper Arm Region  No depletion  Thoracic and Lumbar Region  No depletion  Buccal Region  No depletion  Temple Region  No depletion  Clavicle Bone Region  No depletion  Clavicle and Acromion Bone Region  No depletion  Scapular Bone Region  No depletion  Dorsal Hand  No depletion  Patellar Region  No  depletion  Anterior Thigh Region  No depletion  Posterior Calf Region  No depletion  Edema (RD Assessment)  None  Hair  Reviewed  Eyes  Reviewed  Mouth  Reviewed  Skin  Reviewed  Nails  Reviewed       Diet Order:  No diet orders on file  EDUCATION NEEDS:   Not appropriate for education at this time  Skin:  Skin Assessment: Reviewed RN Assessment  Last BM:  09/21/2017 (Type 7)  Height:   Ht Readings from Last 1 Encounters:  09/22/17 6\' 3"  (1.905 m)    Weight:   Wt Readings from Last 1 Encounters:  09/22/17 (!) 319 lb 3.6 oz (144.8 kg)    Ideal Body Weight:  89.09 kg  BMI:  Body mass index is 39.9 kg/m.  Estimated Nutritional Needs:   Kcal:  1610-96041959-2227 calories (22-25 cal/kg IBW used due to propofol rate)  Protein:  178 grams (2g/kg IBW for BMI 39.9)   Fluid:  Per MD  Dionne AnoWilliam M. Zaira Iacovelli, MS, RD LDN Inpatient Clinical Dietitian Pager [phone]

## 2017-10-23 NOTE — Progress Notes (Signed)
Vent changes made per Dr.P.Ramachandran order. VT 550, Rate 12, Peep 15, FIO2 100. RN aware.

## 2017-10-23 NOTE — Death Summary Note (Signed)
DEATH SUMMARY   Patient Details  Name: Howard [name] MRN: [id number] DOB: Jul 04, 1981  Admission/Discharge Information   Admit Date:  08/26/2017  Date of Death: Date of Death: 04/30/2018  Time of Death: Time of Death: 1934  Length of Stay: 11  Referring Physician: Galen ManilaKennedy, [name], NP   Reason(s) for Hospitalization  Sepsis secondary to Pneumonia and Spontaneous Bacteria Peritonitis   Diagnoses  Preliminary cause of death: Alcoholic cirrhosis (HCC) Secondary Diagnoses (including complications and co-morbidities):  Active Problems:   Respiratory failure (HCC)   SBP (spontaneous bacterial peritonitis) (HCC)   Acute on chronic respiratory failure with hypoxia and hypercapnia (HCC)   Acute encephalopathy   Sepsis [facility](HCC)   Ascites   Brief Hospital Course (including significant findings, care, treatment, and services provided and events leading to death)  Howard [name] is a 37 y.o. year old male who was admitted 09/10/2017 with sepsis secondary to pneumonia and spontaneous bacteria peritonitis. He developed acute hypoxemic respiratory failure requiring continuous Bipap on 09/13/2017, however due to worsening respiratory failure and acute encephalopathy he required mechanical intubation on 09/22/2017. Despite aggressive treatment pt remained encephalopathic secondary to liver cirrhosis and developed severe acidosis with progressive multiorgan failure with severe hypotension requiring vasopressor therapy.  The pt was a hospice pt and DNR prior to hospital admission.  ICU intensivist spoke with pts mother and brother on 001/08/2018 and the decision was made to transition pt to comfort care measures only, therefore the pt was extubated.  The pt expired on 001/18/2019 at 1934 with family members at bedside.     Pertinent Labs and Studies  Significant Diagnostic Studies Dg Chest 1 View  Result Date:  CLINICAL DATA:  Follow-up pulmonary edema. EXAM: CHEST 1 VIEW COMPARISON:   Chest radiograph performed 09/22/2017 FINDINGS: The patient's endotracheal tube is seen ending 3-4 cm above the carina. An enteric tube is noted extending below the diaphragm. The lungs are hypoexpanded. Small bilateral pleural effusions are noted. Bilateral midlung airspace opacification raises concern for worsening pneumonia, though underlying pulmonary edema cannot be excluded. No pneumothorax is seen. The cardiomediastinal silhouette is mildly enlarged. No acute osseous abnormalities are identified. A left IJ line is noted ending about the proximal SVC. IMPRESSION: 1. Endotracheal tube seen ending 3-4 cm above the carina. 2. Lungs hypoexpanded. Bilateral midlung airspace opacification raises concern for worsening pneumonia, though underlying pulmonary edema cannot be excluded. 3. Small bilateral pleural effusions. 4. Mild cardiomegaly. Electronically Signed   By: Roanna RaiderJeffery  Chang M.D.   On: Apr 03, 2018 01:13   Dg Chest 2 View  Result Date: 09/17/2017 CLINICAL DATA:  Initial evaluation for acute hypoxia, recent fall. EXAM: CHEST  2 VIEW COMPARISON:  Prior radiograph from 06/29/2017. FINDINGS: Mild cardiomegaly, stable. Mediastinal silhouette within normal limits. Lungs are hypoinflated. Chronic changes related to COPD. Mild perihilar vascular congestion without overt pulmonary edema. Patchy left basilar opacity favored to reflect atelectasis, although infiltrate not entirely excluded. No obvious pleural effusion. No pneumothorax. No acute osseus abnormality. Mild wedging of the L1 vertebral body is grossly stable. IMPRESSION: 1. Shallow lung inflation with patchy left basilar opacity. Atelectasis is favored, although infiltrate could be considered in the correct clinical setting. 2. Mild cardiomegaly with perihilar vascular congestion without overt pulmonary edema. 3. Underlying COPD. Electronically Signed   By: Rise MuBenjamin  McClintock M.D.   On: 09/13/2017 18:12   Dg Lumbar Spine Complete  Result Date:  09/07/2017 CLINICAL DATA:  Pain to lower back status post fall this morning. EXAM: LUMBAR SPINE - COMPLETE  4+ VIEW COMPARISON:  CT abdomen dated 04/21/2017. FINDINGS: Compression fracture deformity of the L1 vertebral body, partially obscured by overlying soft tissues, new compared to the earlier CT of 04/21/2017. No evidence of associated vertebral body displacement or retropulsion. Remainder of the lumbar vertebral bodies remain normal in height and alignment. Upper sacrum appears intact and normally aligned. Visualized paravertebral soft tissues are unremarkable. IMPRESSION: 1. New compression fracture deformity of the L1 vertebral body, with approximately 25% compression anteriorly, partially obscured by overlying soft tissues. No evidence of significant vertebral body displacement or retropulsion. Recommend CT or MRI of the thoracolumbar spine for more definitive characterization. 2. Remainder of the lumbar spine appears intact and normally aligned. Electronically Signed   By: Bary Richard M.D.   On: 09/07/2017 15:15   Dg Abd 1 View  Result Date: 09/13/2017 CLINICAL DATA:  Possible ileus by prior plain film. EXAM: ABDOMEN - 1 VIEW COMPARISON:  Single-view of the abdomen earlier today. FINDINGS: Bowel gas pattern is nonobstructive. Nondistended gaseous colon noted. Feeding tube is in place the tip in the distal stomach. IMPRESSION: Negative for ileus or obstruction. Electronically Signed   By: Drusilla Kanner M.D.   On: 09/13/2017 12:29   Dg Abd 1 View  Result Date: 09/13/2017 CLINICAL DATA:  Feeding tube placement at bedside. EXAM: Portable ABDOMEN - 1 VIEW COMPARISON:  CT abdomen pelvis 04/21/2017 and earlier. Abdominal x-ray 08/03/2015. FINDINGS: Feeding tube tip at the level of the duodenal bulb, based on the anatomy on the prior coronal CT images. Mild gaseous distension of the visible loops of small bowel in the upper abdomen. Gas-filled transverse colon normal in caliber. IMPRESSION: 1.  Feeding tube tip at the level of the duodenal bulb. 2. Distended loops of small bowel in the visualized upper abdomen, query ileus/enteritis or partial small bowel obstruction. Dedicated two-view abdominal x-rays may be helpful in further evaluation. Electronically Signed   By: Hulan Saas M.D.   On: 09/13/2017 11:40   [place] Chest (pleural Effusion)  Result Date: 09/17/2017 CLINICAL DATA:  Evaluate for pleural effusion EXAM: CHEST ULTRASOUND COMPARISON:  None. FINDINGS: Trace pleural fluid is noted in the right and left chest. Thoracentesis was not performed. IMPRESSION: Trace pleural fluid bilaterally. Electronically Signed   By: Jolaine Click M.D.   On: 09/17/2017 13:45   US Renal  Result Date: 09/13/2017 CLINICAL DATA:  Diabetes mellitus. EXAM: RENAL / URINARY TRACT ULTRASOUND COMPLETE COMPARISON:  Ultrasound of June 30, 2017. FINDINGS: Right Kidney: Length: 12.9 cm. Echogenicity within normal limits. No mass or hydronephrosis visualized. Left Kidney: Length: 13 cm. Echogenicity within normal limits. No mass or hydronephrosis visualized. Bladder: Appears normal for degree of bladder distention. IMPRESSION: Normal renal ultrasound. Electronically Signed   By: Lupita Raider, M.D.   On: 09/13/2017 10:29   US Paracentesis  Result Date: 09/16/2017 INDICATION: Cirrhosis and ascites. EXAM: ULTRASOUND GUIDED  PARACENTESIS MEDICATIONS: None. COMPLICATIONS: None immediate. PROCEDURE: Informed written consent was obtained from the patient after a discussion of the risks, benefits and alternatives to treatment. A timeout was performed prior to the initiation of the procedure. Initial ultrasound was performed to localize ascites. The left lateral abdominal wall was prepped and draped in the usual sterile fashion. 1% lidocaine was used for local anesthesia. Following this, a 6 Fr Safe-T-Centesis catheter was introduced. An ultrasound image was saved for documentation purposes. The paracentesis was  performed. The catheter was removed and a dressing was applied. The patient tolerated the procedure well without immediate post procedural  complication. FINDINGS: A total of approximately 1.3 L of amber color fluid was removed. IMPRESSION: Successful ultrasound-guided paracentesis yielding 1.3 liters of peritoneal fluid. Electronically Signed   By: Irish Lack M.D.   On: 09/16/2017 12:41   Dg Chest Port 1 View  Result Date: 09/22/2017 CLINICAL DATA:  37 year old male with a history of endotracheal tube placement EXAM: PORTABLE CHEST 1 VIEW COMPARISON:  09/19/2017, 09/16/2017 FINDINGS: Cardiomediastinal silhouette likely unchanged with leftward rotation of the patient. Cardiomegaly. Opacity the right hilar region with low lung volumes and patchy interstitial/ airspace opacity at the right base. Retrocardiac opacity. No evidence of pneumothorax. Interval placement of endotracheal tube, which terminates approximately 4.8 cm above the carina. Interval placement of gastric tube which terminates out of the field of view. The side port appears to terminate above the GE junction. Interval placement of left IJ central venous catheter with the tip appearing to terminate in the left brachiocephalic vein. IMPRESSION: Interval placement of endotracheal tube which terminates suitably above the carina. Interval placement of gastric tube with the side-port appearing to be above the GE junction. Advancing the tube advised for better positioning. Interval placement of left IJ central venous catheter, with the tip appearing to terminate in the left brachiocephalic vein. Low lung volumes with bilateral basilar opacities, potentially a combination of edema, atelectasis/ consolidation. Electronically Signed   By: Gilmer Mor D.O.   On: 09/22/2017 12:00   Dg Chest Port 1 View  Result Date: 09/19/2017 CLINICAL DATA:  Respiratory failure. EXAM: PORTABLE CHEST 1 VIEW COMPARISON:  One-view chest x-ray 09/16/2017 FINDINGS: The  heart is enlarged. Lung volumes are low. Right greater than left pleural effusions have improved slightly. Bibasilar airspace disease is still worse on the left. Moderate interstitial edema is noted. IMPRESSION: 1. Cardiomegaly with moderate interstitial edema and bilateral effusions. Findings compatible with congestive heart failure. 2. Persistent basilar airspace disease, left greater than right. This remains concerning for pneumonia. Electronically Signed   By: Marin Roberts M.D.   On: 09/19/2017 07:05   Dg Chest Port 1 View  Result Date: 09/16/2017 CLINICAL DATA:  Respiratory failure EXAM: PORTABLE CHEST 1 VIEW COMPARISON:  September 15, 2017 FINDINGS: Feeding tube no longer apparent. There are pleural effusions bilaterally, larger on the right than left. There is airspace consolidation throughout portions of the right mid lower lung zones as well as in the left base, slightly increased from 1 day prior. There is cardiomegaly with pulmonary vascularity within normal limits. No adenopathy. No evident bone lesions. IMPRESSION: Cardiomegaly with pleural effusions bilaterally. Areas of consolidation bilaterally. Concern for multifocal pneumonia, although there may well be alveolar edema superimposed. Slight increase in opacity on the right is felt to be due to a slight increase in consolidation in the right mid and lower lung zones compared to 1 day prior. Electronically Signed   By: Bretta Bang III M.D.   On: 09/16/2017 07:09   Dg Chest Port 1 View  Result Date: 09/15/2017 CLINICAL DATA:  Respiratory failure. EXAM: PORTABLE CHEST 1 VIEW COMPARISON:  09/13/2017. FINDINGS: The heart is enlarged. RIGHT pleural effusion is increased. There is mild vascular congestion, probably overall stable. A feeding tube has been placed but it is exact location is not established. IMPRESSION: Cardiomegaly persists.  Increasing RIGHT pleural effusion. Electronically Signed   By: Elsie Stain M.D.   On:  09/15/2017 06:56   Dg Chest Port 1 View  Result Date: 09/13/2017 CLINICAL DATA:  Unresponsive.  History of alcohol abuse, cirrhosis EXAM: PORTABLE  CHEST 1 VIEW COMPARISON:  09/02/2017 FINDINGS: Diffuse bilateral mild interstitial thickening. Small bilateral pleural effusions. No pneumothorax. No focal consolidation. Mild bibasilar atelectasis. Stable cardiomegaly. No acute osseous abnormality. IMPRESSION: Findings most concerning for mild pulmonary edema. Electronically Signed   By: Elige Ko   On: 09/13/2017 10:32   Dg Abd Portable 1v  Result Date: 09/22/2017 CLINICAL DATA:  Enteric tube placement EXAM: PORTABLE ABDOMEN - 1 VIEW COMPARISON:  09/13/2017 abdominal radiographs FINDINGS: Enteric tube enters the stomach, with the tip not seen on this image. Gasless abdomen without appreciable bowel dilatation. Left retrocardiac lung opacity. No evidence of pneumatosis or pneumoperitoneum in the upper abdomen. IMPRESSION: 1. Enteric tube enters the stomach, with the tip not seen on this image. 2. Left retrocardiac lung opacity, either atelectasis, aspiration, pneumonia and/or small left pleural effusion . 3. Gasless abdomen without appreciable bowel dilatation. Electronically Signed   By: Delbert Phenix M.D.   On: 09/22/2017 12:01    Microbiology No results found for this or any previous visit (from the past 240 hour(s)).  Lab Basic Metabolic Panel: Recent Labs  Lab 09/17/17 0945 09/18/17 0625 09/19/17 0452 09/22/17 0624 2017/10/13 0759 2017-10-13 1330  NA 134* 136 137 136 135  135  --   K 4.2 4.4 3.9 4.7 3.5  3.5  --   CL 100* 101 98* 94* 94*  94*  --   CO2 31 33* 36* 39* 36*  35*  --   GLUCOSE 133* 88 86 92 86  87  --   BUN 13 18 16 14  22*  23*  --   CREATININE 0.80 0.88 0.77 1.69* 2.36*  2.38*  --   CALCIUM 7.7* 7.7* 7.9* 7.8* 7.8*  7.7*  --   MG  --   --   --   --   --  1.3*  PHOS  --   --   --   --  3.7 4.2   Liver Function Tests: Recent Labs  Lab 09/17/17 0945  09/19/17 0452 09/22/17 0624 13-Oct-2017 0759  AST 44* 38 53*  --   ALT 20 21 25   --   ALKPHOS 97 94 116  --   BILITOT 1.2 0.9 0.8  --   PROT 8.5* 8.9* 9.8*  --   ALBUMIN 1.9* 1.9* 2.0* 1.7*   No results for input(s): LIPASE, AMYLASE in the last 168 hours. Recent Labs  Lab 09/18/17 0713 09/19/17 0452 09/22/17 0624  AMMONIA 75* 58* 55*   CBC: Recent Labs  Lab 09/18/17 0625 09/21/17 0548 09/22/17 0624 October 13, 2017 0759  WBC 10.3 7.5 9.1 11.8*  NEUTROABS  --  5.6  --  8.5*  HGB 10.0* 10.0* 10.0* 10.0*  HCT 32.6* 31.8* 32.0* 32.2*  MCV 88.2 85.5 86.3 84.2  PLT 96* 112* 114* 134*   Cardiac Enzymes: No results for input(s): CKTOTAL, CKMB, CKMBINDEX, TROPONINI in the last 168 hours. Sepsis Labs: Recent Labs  Lab 09/18/17 0625 09/21/17 0548 09/22/17 0624 10/13/17 0759  WBC 10.3 7.5 9.1 11.8*  LATICACIDVEN  --   --   --  1.7    Procedures/Operations  Mechanical Intubation 09/22/2017 Left Internal Jugular Central Line Placement 09/22/2017  Sonda Rumble, AGNP  Pulmonary/Critical Care Pager [phone] (please enter 7 digits) PCCM Consult Pager [phone] (please enter 7 digits)

## 2017-10-23 NOTE — Progress Notes (Signed)
I have been in contact with Mother of Patient Ms. Jamesetta GeraldsBetty Parmar via phone, I have explained to mother that patient is in multiorgan failure and is dying. She understand his grave prognosis.   The mother has confirmed with me that patient would NOT want to be resuscitated and that he would NOT want to live on machines. I have explained that hemodialysis is an option and she has states that patient would NOT want to be resuscitated.  I have explained that mother needs to arrive and visit patient and the next step would be to make patient comfortable. She seems to understand and she has transportation to get to hospital.    The plan is that Once the mother arrives, patient will be made comfort care.   Lucie LeatherKurian David Thursa Emme, M.D.  [facility]  Medical Director [facility] Medical Director [facility]

## 2017-10-23 DEATH — deceased

## 2017-11-05 IMAGING — CT CT RENAL STONE PROTOCOL
2 of 4 series · 16 of 46 positions shown, 18 images · non-contrast
Comparison: CT dated 11/18/2015 and ultrasound dated 04/02/2016

CLINICAL DATA: 35-year-old male with left-sided abdominal pain

EXAM:
CT ABDOMEN AND PELVIS WITHOUT CONTRAST
TECHNIQUE: Multidetector CT imaging of the abdomen and pelvis was performed
following the standard protocol without IV contrast.

[Series 2: axial st · axial · 0.93mm/px · z∈[-1139,-669]mm · 13 of 104 slices shown, 15 images]
[im 5/104  soft-tissue]
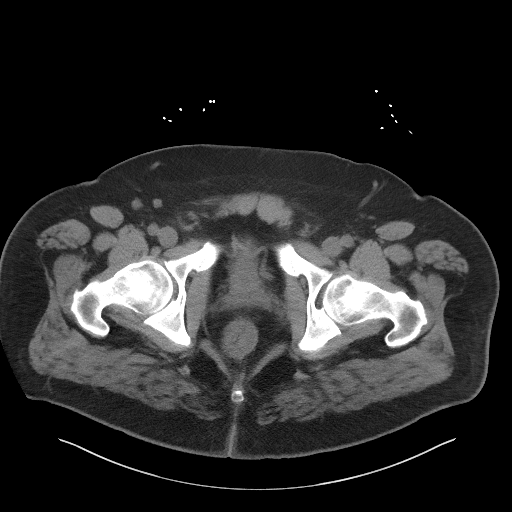
[im 5/104  bone]
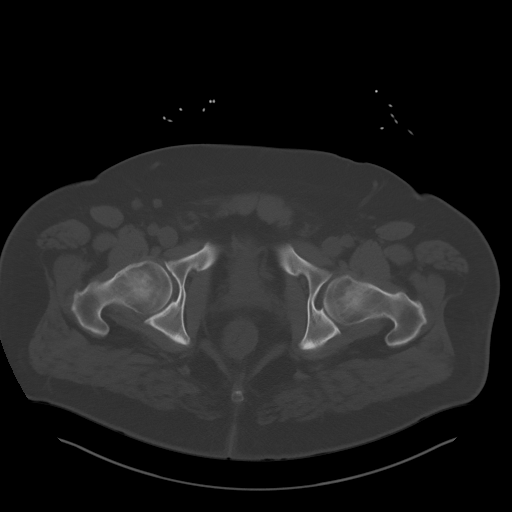
[im 14/104  soft-tissue]
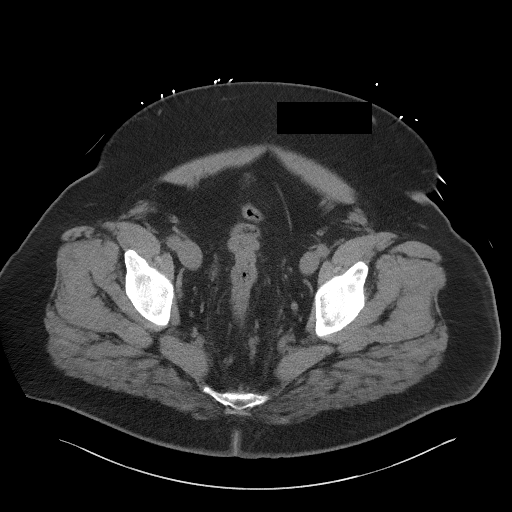
[im 23/104  soft-tissue]
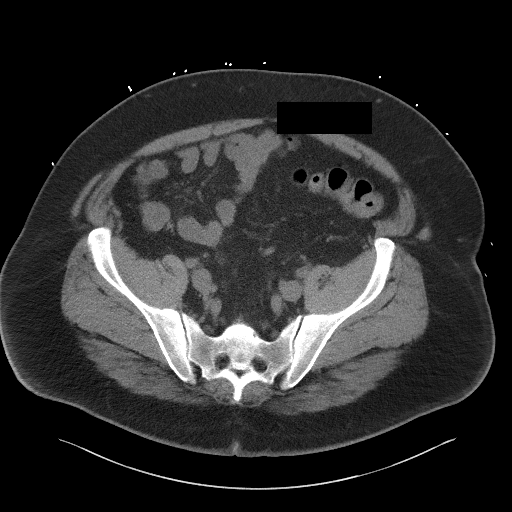
[im 27/104  soft-tissue]
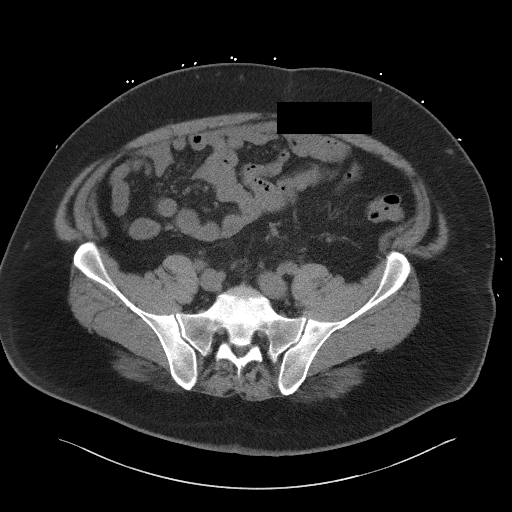
[im 36/104  soft-tissue]
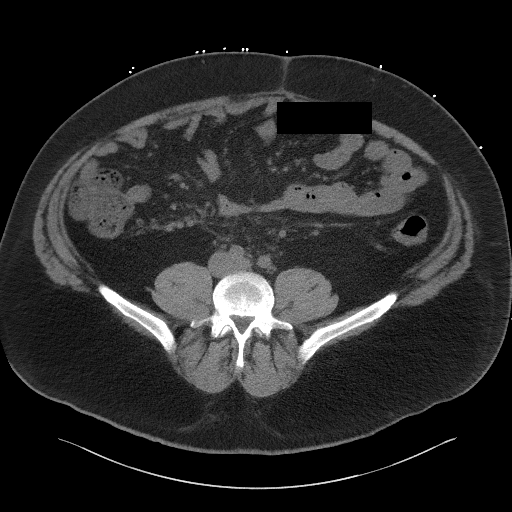
[im 45/104  soft-tissue]
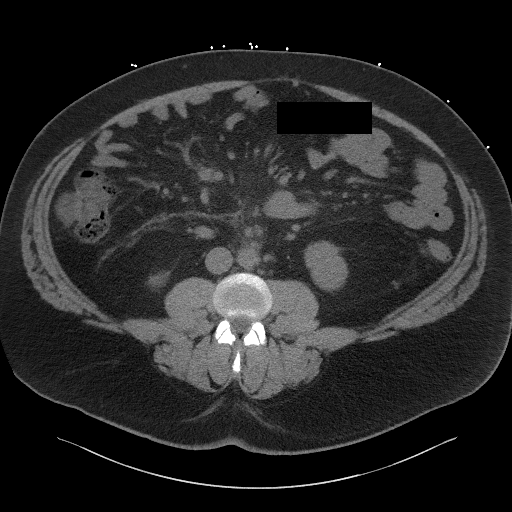
[im 54/104  soft-tissue]
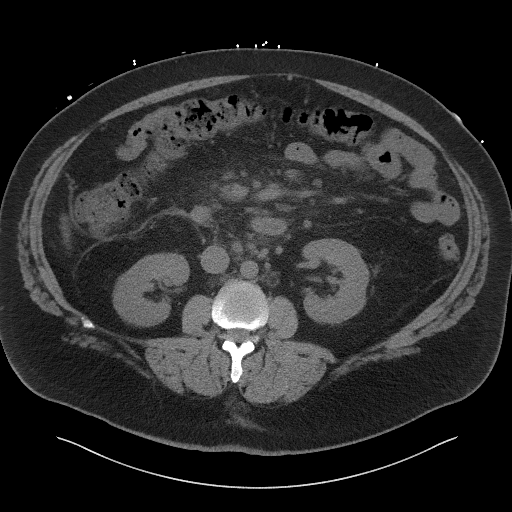
[im 59/104  soft-tissue]
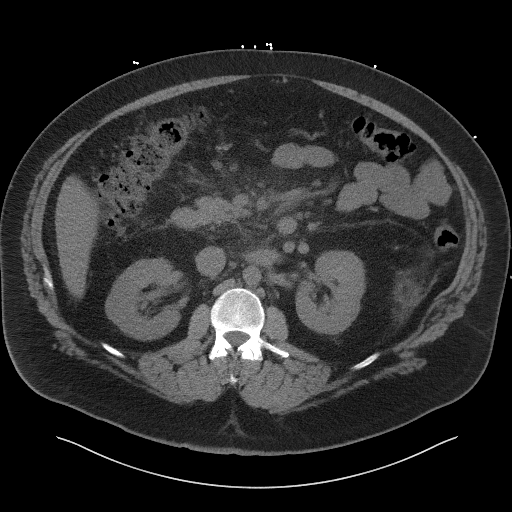
[im 68/104  soft-tissue]
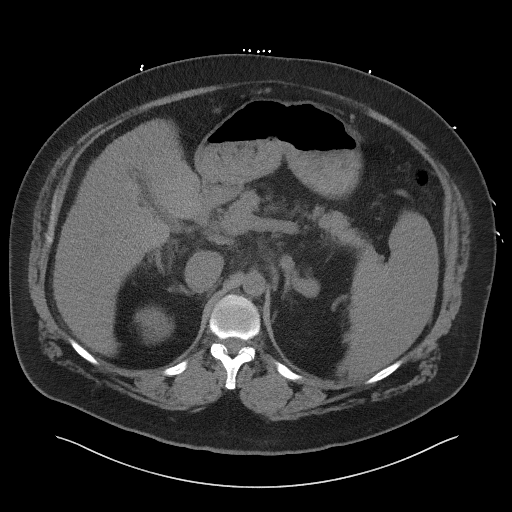
[im 68/104  bone]
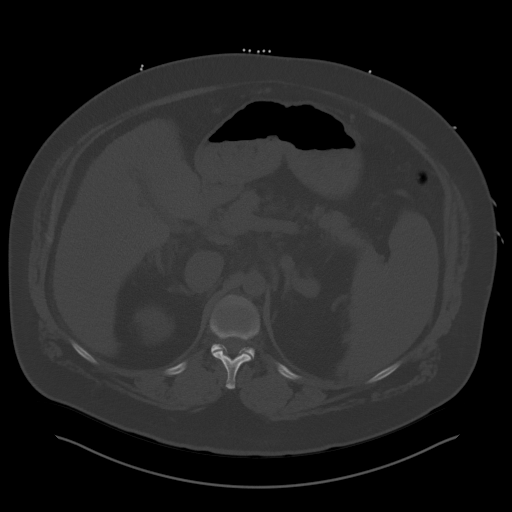
[im 77/104  soft-tissue]
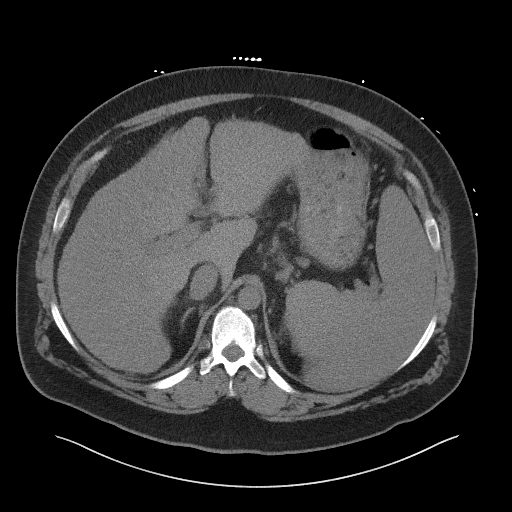
[im 81/104  soft-tissue]
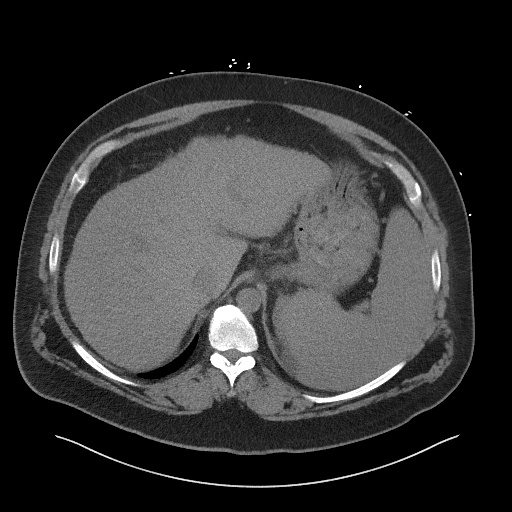
[im 90/104  soft-tissue]
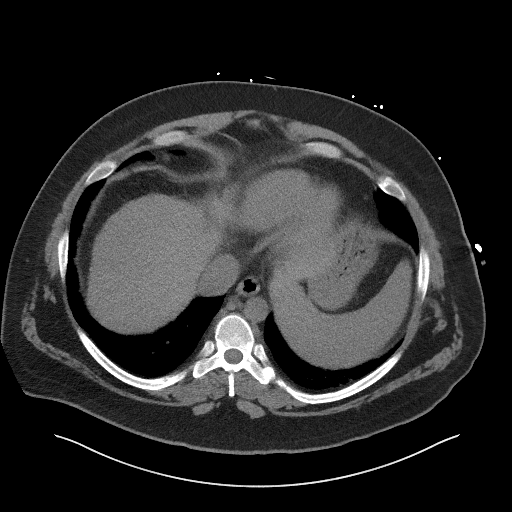
[im 99/104  soft-tissue]
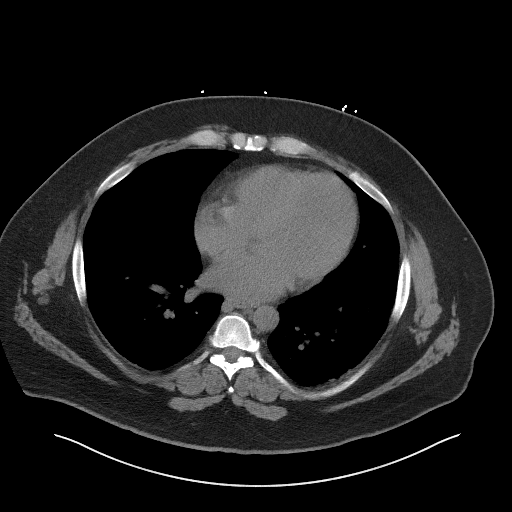

[Series 4: coronal st · coronal · 0.95mm/px · 3 of 126 slices shown]
[im 42/126  soft-tissue]
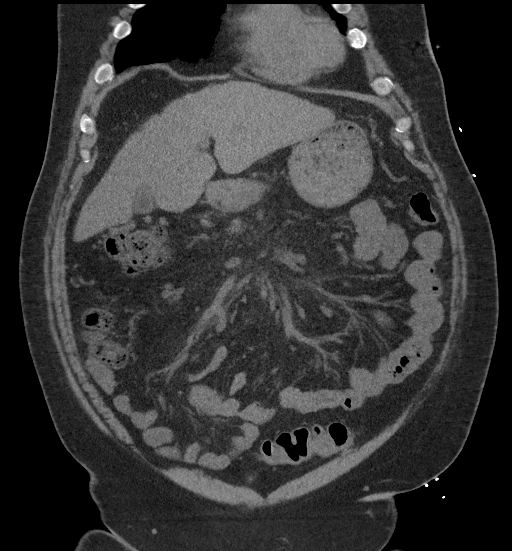
[im 56/126  soft-tissue]
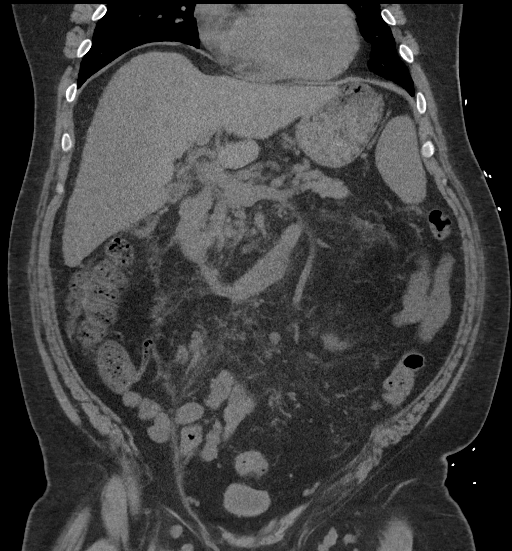
[im 70/126  soft-tissue]
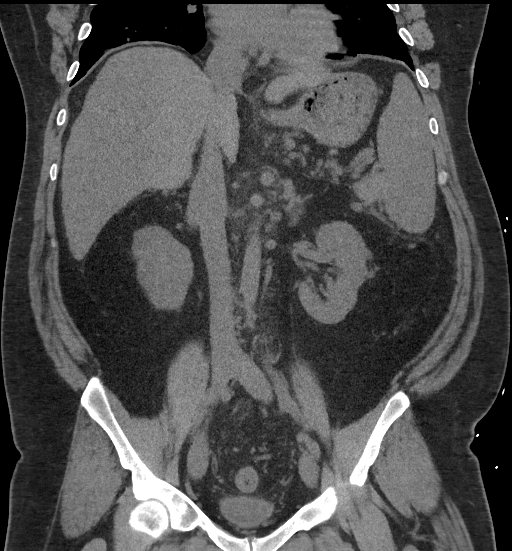

[16 of 46 positions shown; findings below may reference images not displayed]

FINDINGS: Evaluation of this exam is limited in the absence of intravenous
contrast.

Minimal bibasilar dependent atelectasis/scarring. The visualized
lung bases are otherwise clear. There is hypoattenuation of the
cardiac blood pool suggestive of a degree of anemia. Clinical
correlation is recommended. No intra-abdominal free air. Trace
perisplenic free fluid.

Cirrhosis. The gallbladder is collapsed. No calcified stone. The
pancreas, and adrenal glands appear unremarkable. Splenomegaly
measuring 17 cm. The kidneys, visualized ureters, and urinary
bladder appear unremarkable. The prostate and seminal vesicles are
grossly unremarkable.

There is moderate stool throughout the colon. No evidence of bowel
obstruction or active inflammation. Normal appendix.

There is diffuse mesenteric stranding and haziness, nonspecific and
similar to prior study and may be related to underlying cirrhosis
and mesenteric edema. Multiple top-normal mesenteric and
retroperitoneal lymph nodes noted. There is no adenopathy. The
abdominal aorta and IVC are grossly unremarkable on this noncontrast
study. No portal venous gas identified. The abdominal wall soft
tissues appear unremarkable. The osseous structures are intact.
IMPRESSION: Cirrhosis with evidence of portal hypertension and mild
splenomegaly.

Nonspecific diffuse mesenteric stranding, likely related to
cirrhosis and mesenteric edema.

No hydronephrosis or nephrolithiasis.

No evidence of bowel obstruction or active inflammation. Normal
appendix.

## 2018-10-29 IMAGING — US US ART/VEN ABD/PELV/SCROTUM DOPPLER LTD
1 series · 13 of 25 positions shown · non-contrast
Comparison: Ultrasound December 18, 2016.

CLINICAL DATA: Liver failure.

EXAM:
DUPLEX ULTRASOUND OF LIVER
TECHNIQUE: Color and duplex Doppler ultrasound was performed to evaluate the
hepatic in-flow and out-flow vessels. However, exam is limited due
to body habitus.

[Series 1: us art/ven abd/pelv/scrotum doppler ltd · 0.30mm/px · 13 of 38 slices shown]
[im 1/38]
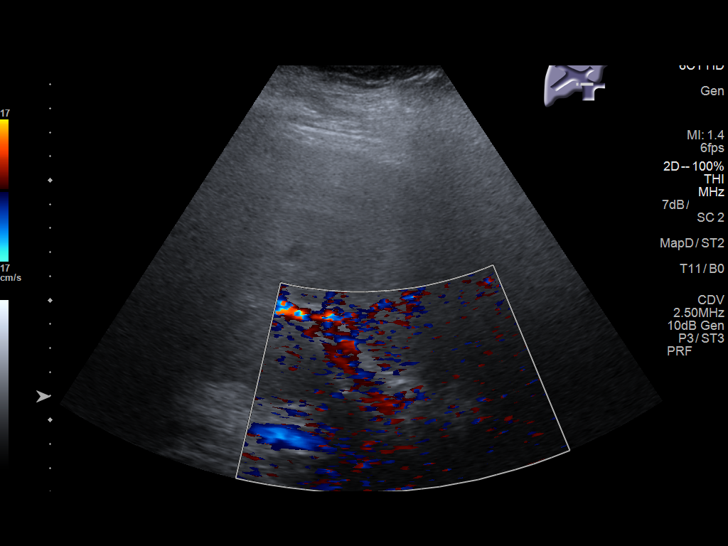
[im 4/38]
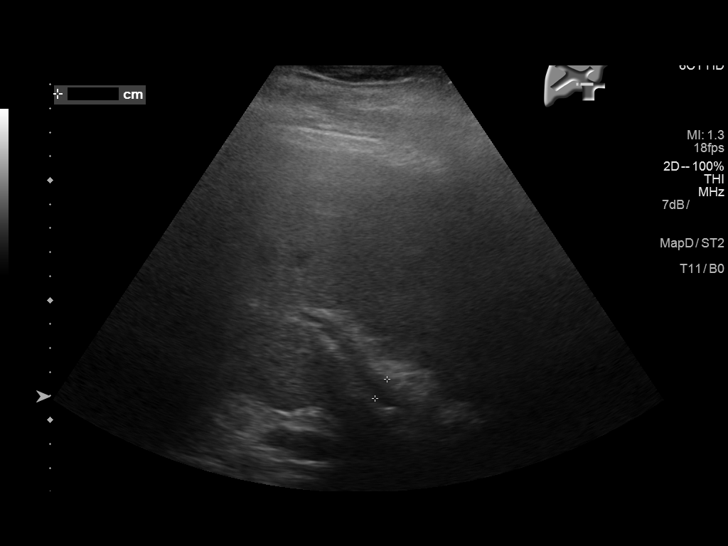
[im 7/38]
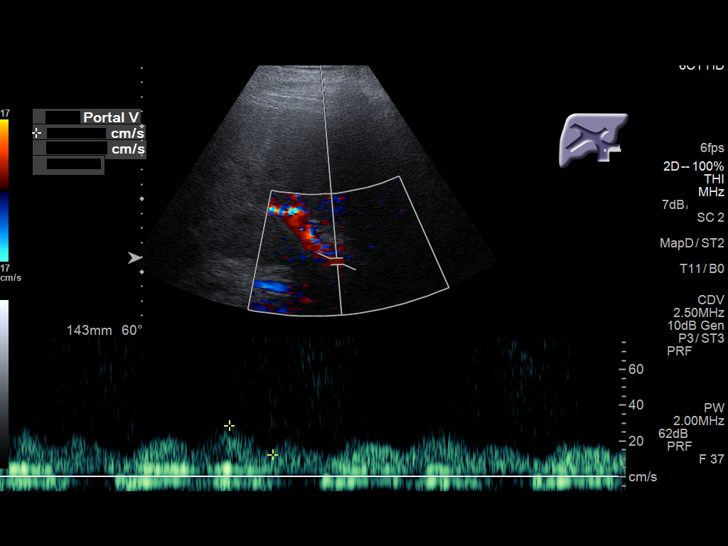
[im 10/38]
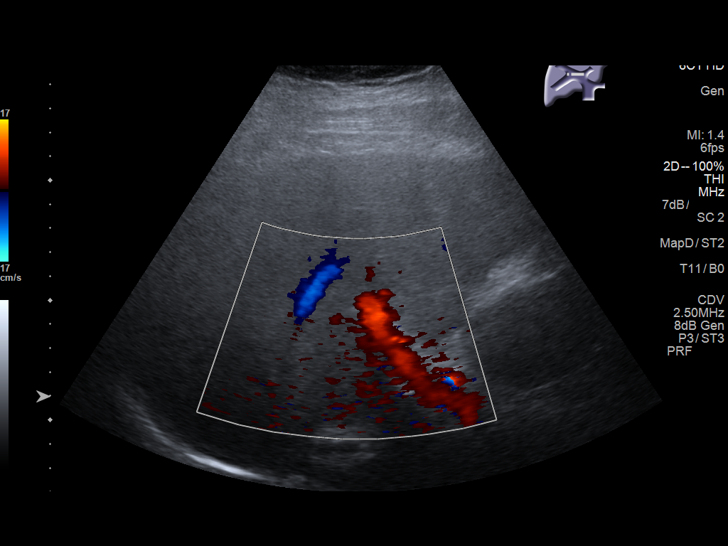
[im 13/38]
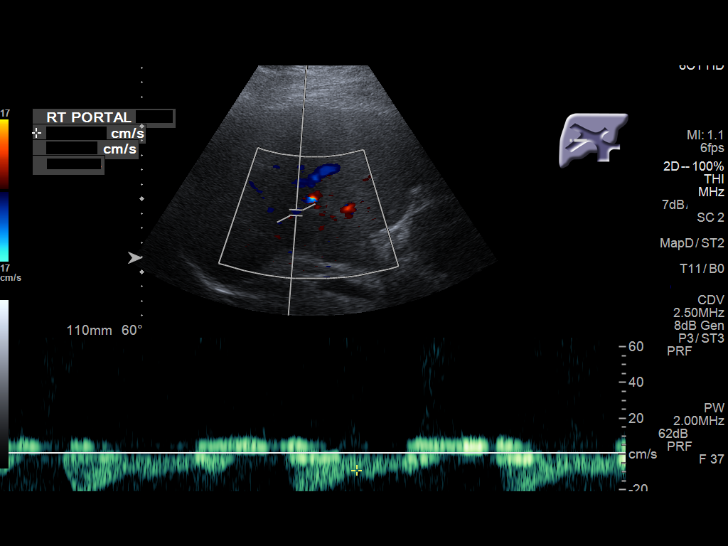
[im 16/38]
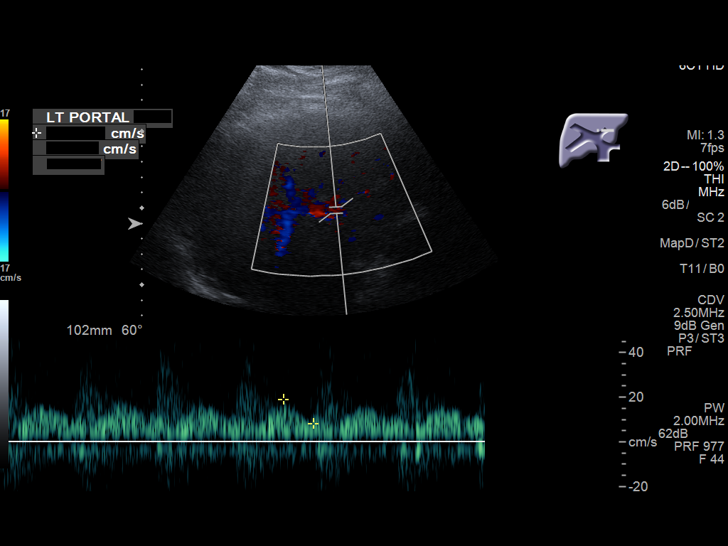
[im 19/38]
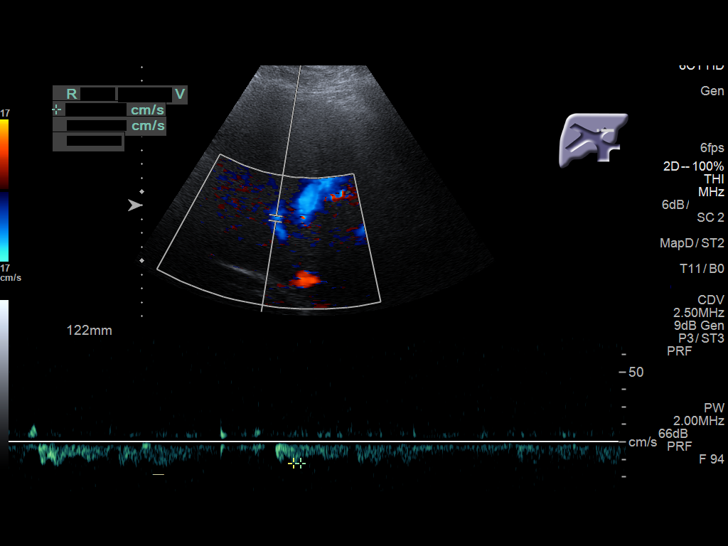
[im 22/38]
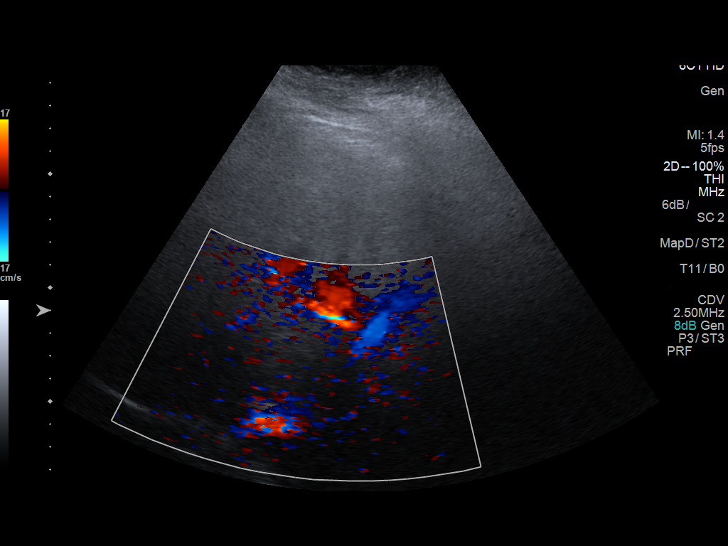
[im 25/38]
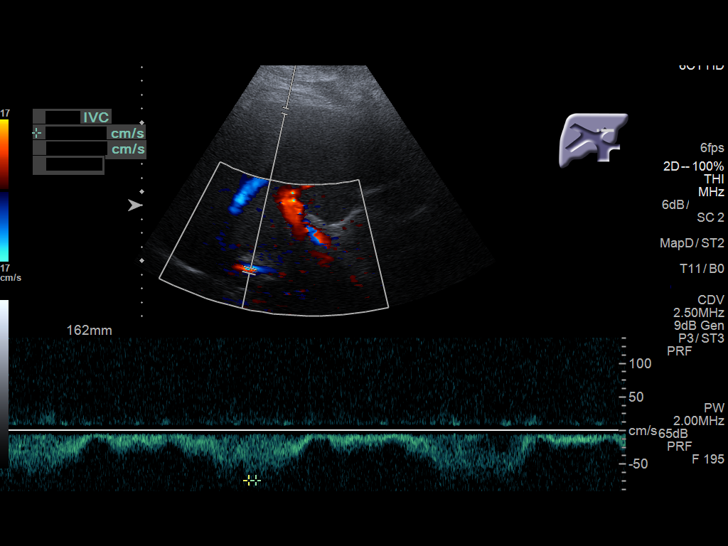
[im 28/38]
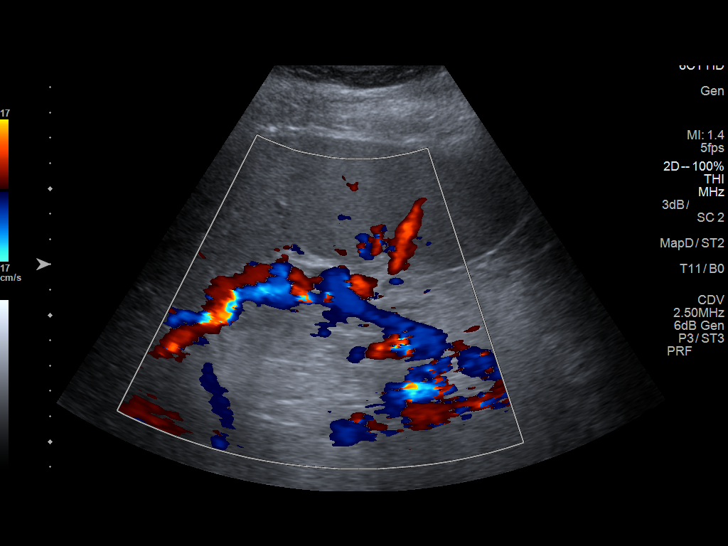
[im 31/38]
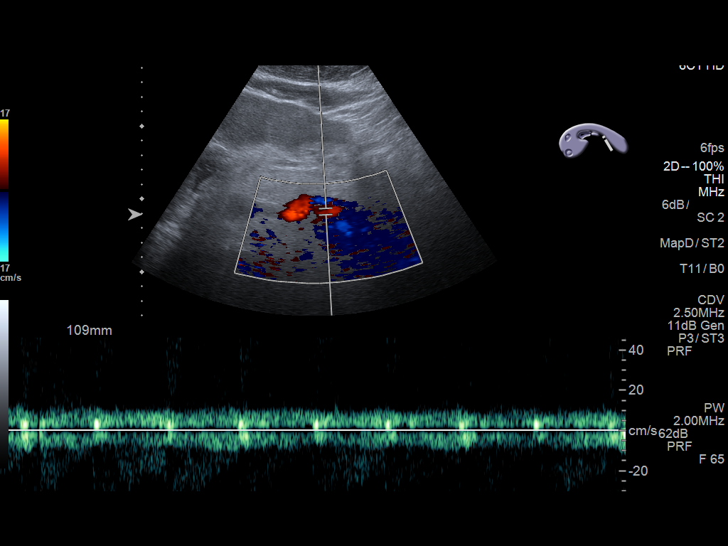
[im 34/38]
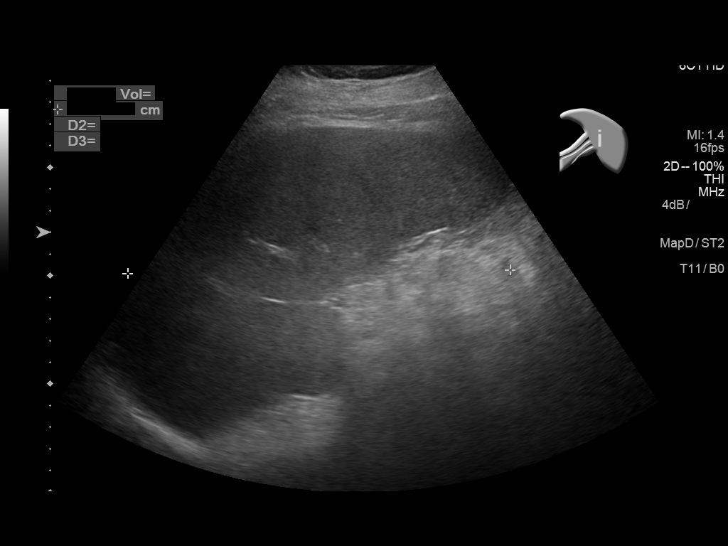
[im 38/38]
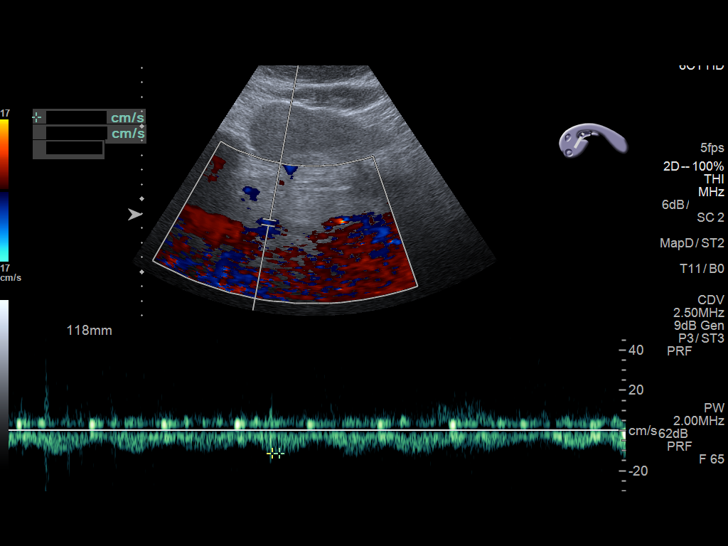

[13 of 25 positions shown; findings below may reference images not displayed]

FINDINGS: Portal Vein Velocities

Main:  30.6 cm/sec

Right:  28.2 cm/sec

Left:  19.1 cm/sec

Normal hepatopetal flow is noted in the main and left pulmonary
veins, although hepatofugal flow is noted in the right portal vein.

Hepatic Vein Velocities

Right:  15.3 cm/sec

Middle:  22.9 cm/sec

Left:  20.9 cm/sec

Normal hepatofugal flow is noted in the hepatic veins.

Hepatic Artery Velocity:  211.6 cm/sec

Splenic Vein Velocity:  25.8 cm/sec

Varices: Absent.

Ascites: Absent.

Spleen measures 18.7 x 17.7 x 5.4 cm, with calculated volume of 932
cubic cm. This is consistent with moderate splenomegaly.
IMPRESSION: Limited exam due to body habitus. There is no evidence of hepatic or
splenic venous thrombosis or occlusion. No definite thrombus is
noted in the visualized portal veins, although hepatofugal flow is
noted in the right portal vein, which would suggest portal
hypertension from hepatic cirrhosis. Thrombus cannot be excluded.
Moderate splenomegaly is noted.
